# Patient Record
Sex: Female | Born: 1935 | Race: White | Hispanic: No | Marital: Married | Smoking: Never smoker
Health system: Southern US, Community
[De-identification: ages and names within clinical notes are randomized; demographics above are authoritative.]

## PROBLEM LIST (undated history)

## (undated) DIAGNOSIS — R0602 Shortness of breath: Secondary | ICD-10-CM

## (undated) DIAGNOSIS — N390 Urinary tract infection, site not specified: Secondary | ICD-10-CM

## (undated) DIAGNOSIS — F329 Major depressive disorder, single episode, unspecified: Secondary | ICD-10-CM

## (undated) DIAGNOSIS — S42309A Unspecified fracture of shaft of humerus, unspecified arm, initial encounter for closed fracture: Secondary | ICD-10-CM

## (undated) DIAGNOSIS — R278 Other lack of coordination: Secondary | ICD-10-CM

## (undated) DIAGNOSIS — R251 Tremor, unspecified: Secondary | ICD-10-CM

## (undated) DIAGNOSIS — I10 Essential (primary) hypertension: Secondary | ICD-10-CM

## (undated) DIAGNOSIS — G2581 Restless legs syndrome: Secondary | ICD-10-CM

## (undated) DIAGNOSIS — Z8709 Personal history of other diseases of the respiratory system: Secondary | ICD-10-CM

## (undated) DIAGNOSIS — E039 Hypothyroidism, unspecified: Secondary | ICD-10-CM

## (undated) DIAGNOSIS — S92909A Unspecified fracture of unspecified foot, initial encounter for closed fracture: Secondary | ICD-10-CM

## (undated) DIAGNOSIS — M4800 Spinal stenosis, site unspecified: Secondary | ICD-10-CM

## (undated) DIAGNOSIS — D649 Anemia, unspecified: Secondary | ICD-10-CM

## (undated) DIAGNOSIS — M3500 Sicca syndrome, unspecified: Secondary | ICD-10-CM

## (undated) DIAGNOSIS — Z8489 Family history of other specified conditions: Secondary | ICD-10-CM

## (undated) DIAGNOSIS — I2510 Atherosclerotic heart disease of native coronary artery without angina pectoris: Secondary | ICD-10-CM

## (undated) DIAGNOSIS — R2681 Unsteadiness on feet: Secondary | ICD-10-CM

## (undated) DIAGNOSIS — S62509A Fracture of unspecified phalanx of unspecified thumb, initial encounter for closed fracture: Secondary | ICD-10-CM

## (undated) DIAGNOSIS — I4510 Unspecified right bundle-branch block: Secondary | ICD-10-CM

## (undated) DIAGNOSIS — G20 Parkinson's disease: Secondary | ICD-10-CM

## (undated) DIAGNOSIS — I739 Peripheral vascular disease, unspecified: Secondary | ICD-10-CM

## (undated) DIAGNOSIS — E7800 Pure hypercholesterolemia, unspecified: Secondary | ICD-10-CM

## (undated) DIAGNOSIS — R9439 Abnormal result of other cardiovascular function study: Secondary | ICD-10-CM

## (undated) DIAGNOSIS — G20A1 Parkinson's disease without dyskinesia, without mention of fluctuations: Secondary | ICD-10-CM

## (undated) DIAGNOSIS — G8929 Other chronic pain: Secondary | ICD-10-CM

## (undated) DIAGNOSIS — M810 Age-related osteoporosis without current pathological fracture: Secondary | ICD-10-CM

## (undated) DIAGNOSIS — K219 Gastro-esophageal reflux disease without esophagitis: Secondary | ICD-10-CM

## (undated) DIAGNOSIS — K5900 Constipation, unspecified: Secondary | ICD-10-CM

## (undated) DIAGNOSIS — R441 Visual hallucinations: Secondary | ICD-10-CM

## (undated) DIAGNOSIS — A419 Sepsis, unspecified organism: Secondary | ICD-10-CM

## (undated) DIAGNOSIS — R41841 Cognitive communication deficit: Secondary | ICD-10-CM

## (undated) DIAGNOSIS — M797 Fibromyalgia: Secondary | ICD-10-CM

## (undated) DIAGNOSIS — I350 Nonrheumatic aortic (valve) stenosis: Secondary | ICD-10-CM

## (undated) DIAGNOSIS — G629 Polyneuropathy, unspecified: Secondary | ICD-10-CM

## (undated) DIAGNOSIS — F419 Anxiety disorder, unspecified: Secondary | ICD-10-CM

## (undated) DIAGNOSIS — F32A Depression, unspecified: Secondary | ICD-10-CM

## (undated) DIAGNOSIS — Z87442 Personal history of urinary calculi: Secondary | ICD-10-CM

## (undated) DIAGNOSIS — Z8719 Personal history of other diseases of the digestive system: Secondary | ICD-10-CM

## (undated) DIAGNOSIS — I5032 Chronic diastolic (congestive) heart failure: Secondary | ICD-10-CM

## (undated) DIAGNOSIS — M1990 Unspecified osteoarthritis, unspecified site: Secondary | ICD-10-CM

## (undated) DIAGNOSIS — R4189 Other symptoms and signs involving cognitive functions and awareness: Secondary | ICD-10-CM

## (undated) HISTORY — DX: Atherosclerotic heart disease of native coronary artery without angina pectoris: I25.10

## (undated) HISTORY — DX: Fibromyalgia: M79.7

## (undated) HISTORY — DX: Unspecified fracture of shaft of humerus, unspecified arm, initial encounter for closed fracture: S42.309A

## (undated) HISTORY — DX: Restless legs syndrome: G25.81

## (undated) HISTORY — DX: Peripheral vascular disease, unspecified: I73.9

## (undated) HISTORY — DX: Shortness of breath: R06.02

## (undated) HISTORY — DX: Unspecified right bundle-branch block: I45.10

## (undated) HISTORY — DX: Parkinson's disease: G20

## (undated) HISTORY — PX: OTHER SURGICAL HISTORY: SHX169

## (undated) HISTORY — DX: Parkinson's disease without dyskinesia, without mention of fluctuations: G20.A1

## (undated) HISTORY — PX: EYE SURGERY: SHX253

## (undated) HISTORY — DX: Anemia, unspecified: D64.9

## (undated) HISTORY — PX: BACK SURGERY: SHX140

## (undated) HISTORY — DX: Chronic diastolic (congestive) heart failure: I50.32

## (undated) HISTORY — DX: Abnormal result of other cardiovascular function study: R94.39

## (undated) HISTORY — DX: Gastro-esophageal reflux disease without esophagitis: K21.9

## (undated) HISTORY — PX: ABDOMINAL HYSTERECTOMY: SHX81

## (undated) HISTORY — PX: WRIST FRACTURE SURGERY: SHX121

## (undated) HISTORY — DX: Pure hypercholesterolemia, unspecified: E78.00

## (undated) HISTORY — DX: Sjogren syndrome, unspecified: M35.00

## (undated) HISTORY — DX: Nonrheumatic aortic (valve) stenosis: I35.0

## (undated) HISTORY — PX: TOTAL HIP ARTHROPLASTY: SHX124

---
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
## 1998-12-20 ENCOUNTER — Ambulatory Visit (HOSPITAL_COMMUNITY): Admission: RE | Admit: 1998-12-20 | Discharge: 1998-12-20 | Payer: Self-pay | Admitting: *Deleted

## 1998-12-29 ENCOUNTER — Encounter: Payer: Self-pay | Admitting: Family Medicine

## 1998-12-29 ENCOUNTER — Ambulatory Visit (HOSPITAL_COMMUNITY): Admission: RE | Admit: 1998-12-29 | Discharge: 1998-12-29 | Payer: Self-pay | Admitting: Family Medicine

## 1999-01-11 ENCOUNTER — Other Ambulatory Visit: Admission: RE | Admit: 1999-01-11 | Discharge: 1999-01-11 | Payer: Self-pay | Admitting: Obstetrics and Gynecology

## 1999-09-26 ENCOUNTER — Encounter: Admission: RE | Admit: 1999-09-26 | Discharge: 1999-09-26 | Payer: Self-pay | Admitting: Obstetrics and Gynecology

## 1999-09-26 ENCOUNTER — Encounter: Payer: Self-pay | Admitting: Obstetrics and Gynecology

## 1999-12-25 ENCOUNTER — Other Ambulatory Visit: Admission: RE | Admit: 1999-12-25 | Discharge: 1999-12-25 | Payer: Self-pay | Admitting: Gastroenterology

## 1999-12-25 ENCOUNTER — Encounter (INDEPENDENT_AMBULATORY_CARE_PROVIDER_SITE_OTHER): Payer: Self-pay

## 2000-02-14 ENCOUNTER — Other Ambulatory Visit: Admission: RE | Admit: 2000-02-14 | Discharge: 2000-02-14 | Payer: Self-pay | Admitting: Obstetrics and Gynecology

## 2000-02-26 ENCOUNTER — Encounter: Payer: Self-pay | Admitting: Obstetrics and Gynecology

## 2000-02-26 ENCOUNTER — Encounter: Admission: RE | Admit: 2000-02-26 | Discharge: 2000-02-26 | Payer: Self-pay | Admitting: Obstetrics and Gynecology

## 2000-03-26 ENCOUNTER — Ambulatory Visit (HOSPITAL_COMMUNITY): Admission: RE | Admit: 2000-03-26 | Discharge: 2000-03-26 | Payer: Self-pay | Admitting: Gastroenterology

## 2000-03-26 ENCOUNTER — Encounter (INDEPENDENT_AMBULATORY_CARE_PROVIDER_SITE_OTHER): Payer: Self-pay | Admitting: Specialist

## 2000-03-29 ENCOUNTER — Encounter: Admission: RE | Admit: 2000-03-29 | Discharge: 2000-03-29 | Payer: Self-pay | Admitting: Gastroenterology

## 2000-03-29 ENCOUNTER — Encounter: Payer: Self-pay | Admitting: Gastroenterology

## 2000-06-11 ENCOUNTER — Encounter: Payer: Self-pay | Admitting: Gastroenterology

## 2000-06-11 ENCOUNTER — Encounter: Admission: RE | Admit: 2000-06-11 | Discharge: 2000-06-11 | Payer: Self-pay | Admitting: Gastroenterology

## 2001-03-04 ENCOUNTER — Encounter: Payer: Self-pay | Admitting: Obstetrics and Gynecology

## 2001-03-04 ENCOUNTER — Ambulatory Visit (HOSPITAL_COMMUNITY): Admission: RE | Admit: 2001-03-04 | Discharge: 2001-03-04 | Payer: Self-pay | Admitting: Obstetrics and Gynecology

## 2001-04-08 ENCOUNTER — Other Ambulatory Visit: Admission: RE | Admit: 2001-04-08 | Discharge: 2001-04-08 | Payer: Self-pay | Admitting: Obstetrics and Gynecology

## 2001-04-16 ENCOUNTER — Ambulatory Visit (HOSPITAL_COMMUNITY): Admission: RE | Admit: 2001-04-16 | Discharge: 2001-04-16 | Payer: Self-pay | Admitting: Cardiovascular Disease

## 2002-01-08 ENCOUNTER — Ambulatory Visit (HOSPITAL_COMMUNITY): Admission: RE | Admit: 2002-01-08 | Discharge: 2002-01-08 | Payer: Self-pay | Admitting: Gastroenterology

## 2002-01-08 ENCOUNTER — Encounter (INDEPENDENT_AMBULATORY_CARE_PROVIDER_SITE_OTHER): Payer: Self-pay | Admitting: Specialist

## 2002-03-17 ENCOUNTER — Other Ambulatory Visit: Admission: RE | Admit: 2002-03-17 | Discharge: 2002-03-17 | Payer: Self-pay | Admitting: Family Medicine

## 2003-04-21 ENCOUNTER — Other Ambulatory Visit: Admission: RE | Admit: 2003-04-21 | Discharge: 2003-04-21 | Payer: Self-pay | Admitting: Family Medicine

## 2004-12-10 HISTORY — PX: CARDIAC CATHETERIZATION: SHX172

## 2005-01-19 ENCOUNTER — Ambulatory Visit (HOSPITAL_COMMUNITY): Admission: RE | Admit: 2005-01-19 | Discharge: 2005-01-19 | Payer: Self-pay | Admitting: Cardiology

## 2005-02-14 ENCOUNTER — Ambulatory Visit: Payer: Self-pay | Admitting: Pulmonary Disease

## 2005-03-19 ENCOUNTER — Ambulatory Visit: Payer: Self-pay | Admitting: Pulmonary Disease

## 2005-03-19 ENCOUNTER — Ambulatory Visit: Admission: RE | Admit: 2005-03-19 | Discharge: 2005-03-19 | Payer: Self-pay | Admitting: Pulmonary Disease

## 2005-03-26 ENCOUNTER — Ambulatory Visit: Payer: Self-pay | Admitting: Pulmonary Disease

## 2005-04-16 ENCOUNTER — Ambulatory Visit: Payer: Self-pay | Admitting: Pulmonary Disease

## 2005-04-26 ENCOUNTER — Other Ambulatory Visit: Admission: RE | Admit: 2005-04-26 | Discharge: 2005-04-26 | Payer: Self-pay | Admitting: Family Medicine

## 2005-07-04 ENCOUNTER — Ambulatory Visit (HOSPITAL_BASED_OUTPATIENT_CLINIC_OR_DEPARTMENT_OTHER): Admission: RE | Admit: 2005-07-04 | Discharge: 2005-07-04 | Payer: Self-pay | Admitting: Surgery

## 2005-07-04 ENCOUNTER — Encounter (INDEPENDENT_AMBULATORY_CARE_PROVIDER_SITE_OTHER): Payer: Self-pay | Admitting: *Deleted

## 2005-10-04 ENCOUNTER — Ambulatory Visit (HOSPITAL_COMMUNITY): Admission: RE | Admit: 2005-10-04 | Discharge: 2005-10-04 | Payer: Self-pay | Admitting: Gastroenterology

## 2005-12-21 ENCOUNTER — Ambulatory Visit (HOSPITAL_COMMUNITY): Admission: RE | Admit: 2005-12-21 | Discharge: 2005-12-21 | Payer: Self-pay | Admitting: General Surgery

## 2006-01-03 ENCOUNTER — Ambulatory Visit (HOSPITAL_COMMUNITY): Admission: RE | Admit: 2006-01-03 | Discharge: 2006-01-03 | Payer: Self-pay | Admitting: Gastroenterology

## 2006-02-25 ENCOUNTER — Ambulatory Visit: Payer: Self-pay | Admitting: Family Medicine

## 2006-03-28 ENCOUNTER — Ambulatory Visit: Payer: Self-pay | Admitting: Family Medicine

## 2006-05-09 ENCOUNTER — Encounter: Admission: RE | Admit: 2006-05-09 | Discharge: 2006-05-09 | Payer: Self-pay | Admitting: Family Medicine

## 2006-11-06 ENCOUNTER — Encounter: Admission: RE | Admit: 2006-11-06 | Discharge: 2006-11-06 | Payer: Self-pay | Admitting: Neurological Surgery

## 2007-01-02 ENCOUNTER — Inpatient Hospital Stay (HOSPITAL_COMMUNITY): Admission: RE | Admit: 2007-01-02 | Discharge: 2007-01-08 | Payer: Self-pay | Admitting: Neurological Surgery

## 2007-01-22 ENCOUNTER — Inpatient Hospital Stay (HOSPITAL_COMMUNITY): Admission: RE | Admit: 2007-01-22 | Discharge: 2007-01-27 | Payer: Self-pay | Admitting: Neurological Surgery

## 2007-02-17 ENCOUNTER — Encounter: Admission: RE | Admit: 2007-02-17 | Discharge: 2007-02-17 | Payer: Self-pay | Admitting: Neurological Surgery

## 2007-04-14 ENCOUNTER — Encounter: Admission: RE | Admit: 2007-04-14 | Discharge: 2007-04-14 | Payer: Self-pay | Admitting: Neurological Surgery

## 2007-05-30 ENCOUNTER — Encounter: Admission: RE | Admit: 2007-05-30 | Discharge: 2007-05-30 | Payer: Self-pay | Admitting: Family Medicine

## 2007-07-28 ENCOUNTER — Ambulatory Visit (HOSPITAL_COMMUNITY): Admission: RE | Admit: 2007-07-28 | Discharge: 2007-07-28 | Payer: Self-pay | Admitting: Orthopedic Surgery

## 2007-08-15 ENCOUNTER — Ambulatory Visit (HOSPITAL_COMMUNITY): Admission: RE | Admit: 2007-08-15 | Discharge: 2007-08-15 | Payer: Self-pay | Admitting: Family Medicine

## 2007-08-18 ENCOUNTER — Encounter: Admission: RE | Admit: 2007-08-18 | Discharge: 2007-08-18 | Payer: Self-pay | Admitting: Neurological Surgery

## 2007-08-26 ENCOUNTER — Encounter: Admission: RE | Admit: 2007-08-26 | Discharge: 2007-08-26 | Payer: Self-pay | Admitting: Neurological Surgery

## 2007-10-03 ENCOUNTER — Encounter
Admission: RE | Admit: 2007-10-03 | Discharge: 2007-10-03 | Payer: Self-pay | Admitting: Physical Medicine and Rehabilitation

## 2008-04-05 ENCOUNTER — Inpatient Hospital Stay (HOSPITAL_COMMUNITY): Admission: RE | Admit: 2008-04-05 | Discharge: 2008-04-08 | Payer: Self-pay | Admitting: Orthopedic Surgery

## 2008-06-03 ENCOUNTER — Ambulatory Visit (HOSPITAL_COMMUNITY): Admission: RE | Admit: 2008-06-03 | Discharge: 2008-06-03 | Payer: Self-pay | Admitting: Family Medicine

## 2008-09-23 ENCOUNTER — Encounter: Admission: RE | Admit: 2008-09-23 | Discharge: 2008-09-23 | Payer: Self-pay | Admitting: Gastroenterology

## 2008-10-28 ENCOUNTER — Ambulatory Visit (HOSPITAL_COMMUNITY): Admission: RE | Admit: 2008-10-28 | Discharge: 2008-10-28 | Payer: Self-pay | Admitting: Gastroenterology

## 2009-01-12 ENCOUNTER — Encounter (INDEPENDENT_AMBULATORY_CARE_PROVIDER_SITE_OTHER): Payer: Self-pay | Admitting: Family Medicine

## 2009-01-12 ENCOUNTER — Ambulatory Visit: Payer: Self-pay | Admitting: Surgery

## 2009-01-12 ENCOUNTER — Ambulatory Visit (HOSPITAL_COMMUNITY): Admission: RE | Admit: 2009-01-12 | Discharge: 2009-01-12 | Payer: Self-pay | Admitting: Family Medicine

## 2009-02-02 ENCOUNTER — Ambulatory Visit (HOSPITAL_COMMUNITY): Admission: RE | Admit: 2009-02-02 | Discharge: 2009-02-02 | Payer: Self-pay | Admitting: Family Medicine

## 2009-06-21 ENCOUNTER — Ambulatory Visit (HOSPITAL_COMMUNITY): Admission: RE | Admit: 2009-06-21 | Discharge: 2009-06-21 | Payer: Self-pay | Admitting: Family Medicine

## 2009-09-27 ENCOUNTER — Encounter: Admission: RE | Admit: 2009-09-27 | Discharge: 2009-09-27 | Payer: Self-pay | Admitting: Neurological Surgery

## 2009-10-07 ENCOUNTER — Encounter: Admission: RE | Admit: 2009-10-07 | Discharge: 2009-10-07 | Payer: Self-pay | Admitting: Neurological Surgery

## 2009-12-23 ENCOUNTER — Encounter: Admission: RE | Admit: 2009-12-23 | Discharge: 2009-12-23 | Payer: Self-pay | Admitting: Family Medicine

## 2009-12-27 ENCOUNTER — Encounter: Admission: RE | Admit: 2009-12-27 | Discharge: 2009-12-27 | Payer: Self-pay | Admitting: Neurological Surgery

## 2010-02-07 ENCOUNTER — Ambulatory Visit (HOSPITAL_COMMUNITY): Admission: RE | Admit: 2010-02-07 | Discharge: 2010-02-07 | Payer: Self-pay | Admitting: Family Medicine

## 2010-02-14 ENCOUNTER — Encounter: Admission: RE | Admit: 2010-02-14 | Discharge: 2010-02-14 | Payer: Self-pay | Admitting: Neurological Surgery

## 2010-05-24 ENCOUNTER — Inpatient Hospital Stay (HOSPITAL_COMMUNITY): Admission: RE | Admit: 2010-05-24 | Discharge: 2010-05-27 | Payer: Self-pay | Admitting: Neurological Surgery

## 2010-06-09 ENCOUNTER — Ambulatory Visit
Admission: RE | Admit: 2010-06-09 | Discharge: 2010-06-09 | Payer: Self-pay | Source: Home / Self Care | Admitting: Family Medicine

## 2010-06-09 ENCOUNTER — Ambulatory Visit: Payer: Self-pay | Admitting: Vascular Surgery

## 2010-06-26 ENCOUNTER — Encounter: Admission: RE | Admit: 2010-06-26 | Discharge: 2010-06-26 | Payer: Self-pay | Admitting: Neurological Surgery

## 2010-06-28 ENCOUNTER — Ambulatory Visit (HOSPITAL_COMMUNITY): Admission: RE | Admit: 2010-06-28 | Discharge: 2010-06-28 | Payer: Self-pay | Admitting: Family Medicine

## 2010-08-15 ENCOUNTER — Encounter: Admission: RE | Admit: 2010-08-15 | Discharge: 2010-08-15 | Payer: Self-pay | Admitting: Neurological Surgery

## 2010-10-05 ENCOUNTER — Encounter: Admission: RE | Admit: 2010-10-05 | Discharge: 2010-10-05 | Payer: Self-pay | Admitting: Cardiology

## 2010-11-10 ENCOUNTER — Encounter: Admission: RE | Admit: 2010-11-10 | Discharge: 2010-11-10 | Payer: Self-pay | Admitting: Diagnostic Neuroimaging

## 2010-11-21 ENCOUNTER — Encounter
Admission: RE | Admit: 2010-11-21 | Discharge: 2010-11-21 | Payer: Self-pay | Source: Home / Self Care | Attending: Neurological Surgery | Admitting: Neurological Surgery

## 2010-12-30 ENCOUNTER — Encounter: Payer: Self-pay | Admitting: Diagnostic Neuroimaging

## 2010-12-31 ENCOUNTER — Encounter: Payer: Self-pay | Admitting: Orthopedic Surgery

## 2010-12-31 ENCOUNTER — Encounter: Payer: Self-pay | Admitting: General Surgery

## 2011-02-26 LAB — CBC
HCT: 37.7 % (ref 36.0–46.0)
Hemoglobin: 12.7 g/dL (ref 12.0–15.0)
MCHC: 33.8 g/dL (ref 30.0–36.0)
MCV: 95.1 fL (ref 78.0–100.0)
Platelets: 338 10*3/uL (ref 150–400)
RBC: 3.96 MIL/uL (ref 3.87–5.11)
WBC: 8.8 10*3/uL (ref 4.0–10.5)

## 2011-02-26 LAB — URINALYSIS, ROUTINE W REFLEX MICROSCOPIC
Bilirubin Urine: NEGATIVE
Glucose, UA: NEGATIVE mg/dL
Hgb urine dipstick: NEGATIVE
Protein, ur: NEGATIVE mg/dL
Urobilinogen, UA: 1 mg/dL (ref 0.0–1.0)
pH: 6 (ref 5.0–8.0)

## 2011-02-26 LAB — URINE MICROSCOPIC-ADD ON

## 2011-02-26 LAB — COMPREHENSIVE METABOLIC PANEL
Alkaline Phosphatase: 77 U/L (ref 39–117)
Calcium: 9.8 mg/dL (ref 8.4–10.5)
Creatinine, Ser: 0.74 mg/dL (ref 0.4–1.2)
GFR calc non Af Amer: >60 mL/min (ref >60)
Glucose, Bld: 99 mg/dL (ref 70–99)
Potassium: 4.6 mEq/L (ref 3.5–5.1)
Total Bilirubin: 0.2 mg/dL — ABNORMAL LOW (ref 0.3–1.2)

## 2011-02-26 LAB — SURGICAL PCR SCREEN
MRSA, PCR: NEGATIVE
Staphylococcus aureus: NEGATIVE

## 2011-02-26 LAB — DIFFERENTIAL
Basophils Relative: 0 % (ref 0–1)
Monocytes Absolute: 0.7 10*3/uL (ref 0.1–1.0)

## 2011-02-26 LAB — PROTIME-INR: Prothrombin Time: 13.4 seconds (ref 11.6–15.2)

## 2011-03-09 ENCOUNTER — Ambulatory Visit
Admission: RE | Admit: 2011-03-09 | Discharge: 2011-03-09 | Disposition: A | Discharge status: Home / Self Care | Payer: Medicare Other | Source: Ambulatory Visit | Attending: Neurological Surgery | Admitting: Neurological Surgery

## 2011-03-09 ENCOUNTER — Other Ambulatory Visit: Payer: Self-pay | Admitting: Neurological Surgery

## 2011-03-09 DIAGNOSIS — M549 Dorsalgia, unspecified: Secondary | ICD-10-CM

## 2011-03-14 ENCOUNTER — Encounter (HOSPITAL_COMMUNITY): Payer: Medicare Other | Attending: Family Medicine

## 2011-03-14 DIAGNOSIS — M810 Age-related osteoporosis without current pathological fracture: Secondary | ICD-10-CM | POA: Insufficient documentation

## 2011-04-24 NOTE — Discharge Summary (Signed)
NAMEDEBANY, VANTOL             ACCOUNT NO.:  [id number]   MEDICAL RECORD NO.:  [id number]          PATIENT TYPE:  INP   LOCATION:  1618                         FACILITY:  [facility]   PHYSICIAN:  Madlyn Frankel. Charlann Boxer, M.D.  DATE OF BIRTH:  1931/05/13   DATE OF ADMISSION:  04/05/2008  DATE OF DISCHARGE:                               DISCHARGE SUMMARY   ADMITTING DIAGNOSES:  1. Osteoarthritis.  2. Degenerative stenosis of the lumbar spine.  3. Hypothyroidism.  4. Dyslipidemia.  5. Hiatal hernia.  6. Osteoporosis.  7. Restless leg syndrome.   DISCHARGE DIAGNOSES:  1. Osteoarthritis.  2. Degenerative stenosis of the lumbar spine.  3. Hypothyroidism.  4. Dyslipidemia.  5. Hiatal hernia.  6. Osteoporosis.  7. Restless leg syndrome.   CONSULTATION:  None.   PROCEDURE:  Right total hip arthroplasty by surgeon Dr. Durene Romans.  Assistant Dwyane Luo PA-C.   HISTORY OF PRESENT ILLNESS:  A 77 year old female with a history of  right hip and groin pain secondary to osteoarthritis.  Refractory to all  conservative treatments.  Does have a recent history of lumbar  laminectomy with subsequent interbody fusion.  She was presurgically  assessed of her right total hip replacement.   LABS:  Preadmission CBC showed hemoglobin/hematocrit respectively be  12.8, 38.6 with platelets of 357.  She was tracked throughout her course  of stay.  She did have some blood loss but was stable at time of  discharge.  She had 9 hemoglobin, 26.3 hematocrit, platelets were 219.  Preadmission UA negative.  Metabolic panel preadmission all within  normal limits.  Tracked throughout her course of stay.  At time of  discharge:  sodium 141, potassium 4.1, glucose 121, creatinine 0.74,  calcium was 8.6 and a normal kidney function.   RADIOLOGY:  Chest two-view:  no active disease.  Portable pelvis  satisfactory, right hip replacement.   HOSPITAL COURSE:  The patient underwent right total hip replacement,  tolerated  procedure well, was admitted to the orthopedic floor.  Her  stay was unremarkable except for limited progress with her physical  therapy.  Otherwise she remained hemodynamically stable throughout the  course of stay.  She did have a drop in her hemoglobin, was stable and  was not lightheaded when ambulating.  Her dressing was changed on a  daily basis without any significant drainage from her wound.  Her  Hemovac was discontinued intact.  She is neurovascularly intact for her  right lower extremity throughout her course stay.  Dressing was changed  on a daily basis.  She was weightbearing as tolerated.  Made moderate to  limited progress.  She was of moderate amount of assistance just with  transition, ambulated approximately 25 feet prior to discharge with the  use of a rolling walker.  DVT prophylaxis Lovenox was started in the  hospital with plans for continuation for an additional 11 days at time  of discharge with then subsequent switching over to aspirin on a daily  basis.  On the third day she was stable and ready for discharge for  further rehab.   DISCHARGE DISPOSITION:  Discharged to rehab facility stable and improved  condition with need for further progress.   DISCHARGE PHYSICAL THERAPY:  She is weightbearing as tolerated with the  use rolling walker with goals of physical therapy to be to increase  independence in activities of daily living, minimize pain, increase  range of motion, increase strength.  Want to transition from rolling  walker at a single point cane, as soon as she has good strength and  balance.   DISCHARGE WOUND CARE:  Keep wound dry, change dressing on a daily basis.  The patient may shower but cover with plastic, tape edges.  Rechange dry  dressing afterwards.   DISCHARGE DIET:  Regular as tolerated by the patient.   DISCHARGE MEDICATIONS:  1. Lovenox 40 mg subcu every 24 hours times 11 days.  2. Robaxin 500 mg by mouth every six to eight hours as  needed for      muscle spasm pain.  3. Vicodin 5/325one to two by mouth every four to six hours as needed      for pain.  4. Colace 100 mg by mouth twice daily as needed for constipation.  5. MiraLax 17 grams by mouth daily as needed for constipation.  6. Enteric-coated aspirin 325 mg one by mouth daily x4 weeks after      Lovenox completed.   HOME MEDICATIONS:  1. Prevacid 30 mg by mouth twice daily.  2. Pravastatin 40 mg by mouth daily.  3. Metoprolol 50 mg one by mouth daily.  4. Zetia 10 mg one by mouth daily.  5. Evista 60 mg one by mouth daily.  6. Mirapex 25 mg one by mouth daily.  7. Synthroid 50 mcg for three days and then Synthroid 75 mcg for four      days, verify with the patient.  8. Diltiazem 120 mg by mouth daily.  9. Citalopram 20 mg one by mouth daily.  10.Calcium to verify dosage amount for the patient but appears to say      3000 mg one daily.  11.Multivitamin daily.   DISCHARGE FOLLOWUP:  Follow up with Dr. Charlann Boxer at phone number [phone] in  10-14 days for wound check.     ______________________________  Yetta Glassman. [address], [place]      Madlyn Frankel. Charlann Boxer, M.D.  Electronically Signed    BLM/MEDQ  D:  04/08/2008  T:  04/08/2008  Job:  [id number]

## 2011-04-24 NOTE — Op Note (Signed)
NAMEJAVONA, BERGEVIN             ACCOUNT NO.:  [id number]   MEDICAL RECORD NO.:  [id number]          PATIENT TYPE:  INP   LOCATION:  0002                         FACILITY:  [facility]   PHYSICIAN:  Madlyn Frankel. Charlann Boxer, M.D.  DATE OF BIRTH:  Nov 04, 1931   DATE OF PROCEDURE:  04/05/2008  DATE OF DISCHARGE:                               OPERATIVE REPORT   PREOPERATIVE DIAGNOSIS:  Right hip osteoarthritis.   POSTOPERATIVE DIAGNOSIS:  Right hip osteoarthritis.   PROCEDURE:  Right total hip replacement.   COMPONENTS USED:  DePuy hip system:  Size 50 pinnacle cup; a 32, +4, ten-  degree high walled liner; with a 32, +1 ball; and a trial lock 7  standard stem.   SURGEON:  Madlyn Frankel. Charlann Boxer, M.D.   ASSISTANT:  Yetta Glassman. Mann, PA.   ANESTHESIA:  General.   BLOOD LOSS:  500.   DRAINS:  Times one.   COMPLICATIONS:  None.   INDICATIONS FOR THE PROCEDURE:  Ms. Jobst is a pleasant 77 year old  female who presented to the office for evaluation of some right hip  pain.  She had failed to respond to some conservative measures.  Radiographs had revealed some degenerative changes.  She had been seen  and evaluated by Dr. Marikay Alar for potential back workup.  That workup  was negative with persistent anterior thigh pain despite conservative  measures of injections, she wished to proceed with hip replacement  surgery.   She had actually had MRI that had revealed degenerative with labral  tearing and with persistent discomfort again despite conservative  measures, she wished to the hip surgery.  We reviewed the risks and  benefits of this procedure.  The risks of persistent discomfort despite  that workup and the procedure itself.  We discussed the hospital stay,  the risk of infection, DVT, component failure, need for revision  surgery.  Consent was obtained.   PROCEDURE IN DETAIL:  The patient was brought to the operative theater.  Once adequate anesthesia, preoperative antibiotics, Ancef  were  administered, the patient was then positioned in the left lateral  decubitus position with the right hip up.  The right lower extremity was  then prescrubbed, then prepped and draped in sterile fashion.  The  lateral incision was made in the proximal aspect of the hip.  The  iliotibial band and gluteal fascia were incised posteriorly.  The short  external rotators were taken down separate from the posterior capsule.  An L capsulotomy was made preserving the posterior leaflet for  protection of the sciatic nerve, then later anatomic repair at the end  of the case.  The hip was dislocated and the neck osteotomy made based  on the preoperative templating and anatomic landmarks.  The femur was  prepared first with the use of a box osteotome to the lateral portion of  the neck, followed by a starting drill and a hand reamer.  I irrigated  the femur to prevent fat emboli.  I then began broaching the femur with  size 1 broach, setting my anteversion at 20-25 degrees.  I initially  broached up  to a size 5.  I used the calcar planer to finish off this  cut.  I attended now the acetabulum removing the labrum and placing  retractors.  I reamed with a 43 reamer and then reamed up using curved  reamers to a 49 reamer with an excellent bony bed preparation.  There  was a good posterior wall.  Still I wanted to use the 50-mm cup to get  to a 32 head size with polyethylene.   The size 50 pinnacle cup was then impacted with a proximal third 35-40  degrees of abduction and 20 degrees of forward flexion.  I placed two  cancellous screws as support, this despite a good scratch fit.  I  trialed with a 32 neutral, +4 liner.   At this point I attempted my trials.  I initially trialed with 5.  I  felt there was excessive shuck in extension.  However the range of  motion was very good without impingement.  I went ahead and removed this  5 broach and impacted up to a size 7 this time which sat at the level  of  my neck cut.  The size 5 had subsided a bit.   I retrialed with a size 7 standard stem and neck, with a +1 ball.  The  leg lengths appeared to be equal and stable.  There was still about a  millimeter or 2 of shuck.  I did not want to lengthen any more.  At this  point I removed all trials.  I impacted a 50 x 32. Ten-degree, +4 liner.  I placed the 10 degrees up in the superior aspect of the cup.  I did not  need for a stability issue.  I finally impacted the 7 standard stem to  the level where the broach had sat and retrialed, but was happy with a  +1 ball.  The final 32, +1 ball was impacted on a clean and dry trunnion  and the hip reduced.  The hip was irrigated throughout the case and at  this point there was some oozing from the acetabulum and the bone  preparation of femur.  Once the final components were placed, there was  no significant hemostasis required.  I did place a medium Hemovac drain.  I closed the posterior capsule with #1 Ethibond to the superior leaflet.  I then reapproximated the iliotibial band using #1 Ethibond and #1  Vicryl on the gluteal fascia.  The remaining wound was closed in layers  with a 2-0 Vicryl and a running 4-0 Monocryl.  Hip was cleaned, dried  and dressed sterilely with Steri-Strips and a sterile dressing.  She was  brought to recovery room, extubated, tolerated the procedure well.      Madlyn Frankel Charlann Boxer, M.D.  Electronically Signed     MDO/MEDQ  D:  04/05/2008  T:  04/05/2008  Job:  [id number]

## 2011-04-24 NOTE — H&P (Signed)
NAMEALEYSSA, PIKE             ACCOUNT NO.:  [id number]   MEDICAL RECORD NO.:  [id number]          PATIENT TYPE:  INP   LOCATION:                               FACILITY:  [facility]   PHYSICIAN:  Madlyn Frankel. Charlann Boxer, M.D.  DATE OF BIRTH:  [birth year]   DATE OF ADMISSION:  04/05/2008  DATE OF DISCHARGE:                              HISTORY & PHYSICAL   ATTENDING PHYSICIAN:  Madlyn Frankel. Charlann Boxer, M.D.   PROCEDURE:  Will be a right total hip arthroplasty.   CHIEF COMPLAINT:  Right hip and groin pain.   HISTORY OF PRESENT ILLNESS:  A 77 year old female with a history of  right hip and groin pain secondary to osteoarthritis.  It has been  refractory to all conservative treatment including intra-articular  injection.  She does have a recent history of lumbar laminectomy with  subsequent interbody fusion.  She has been presurgically assessed for a  right total hip replacement due to failure of conservative measures  associated with right hip pain.   PAST MEDICAL HISTORY:  Significant for:  1. Osteoarthritis.  2. Degenerative stenosis of her lumbar spine.  3. Hypothyroidism.  4. Dyslipidemia.  5. Hiatal hernia.  6. Osteoporosis.  7. Restless leg syndrome.   PAST SURGICAL HISTORY:  Includes lumbar laminectomy, lumbar fusion,  hysterectomy, breast cyst removed.   FAMILY HISTORY:  Heart disease, arthritis.   SOCIAL HISTORY:  She is married.  Primary care caregiver will be her  husband postoperatively.  She does lives in [place].  She may  require rehab versus just her normal living quarters.   DRUG ALLERGIES:  CODEINE and LACTOSE intolerant.   MEDICATIONS:  1. Prevacid 30 mg p.o. b.i.d.  2. Pravastatin 40 mg p.o. daily.  3. Metoprolol 50 mg one p.o. daily.  4. Zetia 10 mg one p.o. daily.  5. Evista 60 mg one p.o. daily.  6. Mirapex 25 mg one p.o. daily.  7. Synthroid 50 mcg for 3 days and then Synthroid 75 mcg for 4 days,      verify with the patient.  8. Diltiazem 120 mg one  p.o. daily.  9. Citalopram 20 mg one p.o. daily.  10.Colace 100 mg p.o. daily.  11.Aspirin 81 mg p.o. daily.  12.Calcium 3000 mg one daily.  13.Multivitamin daily.  14.MiraLax 17 grams 3 times a week.   REVIEW OF SYSTEMS:  See HPI.   PHYSICAL EXAMINATION:  VITAL SIGNS:  Pulse 72, respirations 18, blood  pressure 120/76.  GENERAL:  Awake, alert and oriented, well-developed, well-nourished, in  no acute distress.  NECK:  Supple.  No carotid bruits.  CHEST:  Lungs clear to auscultation bilaterally.  BREASTS:  Deferred.  HEART:  Regular rate and rhythm.  S1-S2 distinct.  ABDOMEN:  Soft, nontender, nondistended.  Bowel sounds present.  GENITOURINARY:  Deferred.  EXTREMITIES:  Right hip has increased pain with internal range of  motion.  SKIN:  No cellulitis.  NEUROLOGIC:  Intact distal sensibilities.   LABS:  EKG, chest x-ray all pending presurgical testing.   IMPRESSION:  Right hip osteoarthritis.   PLAN OF ACTION:  Right  total hip arthroplasty by surgeon Dr. Durene Romans at [facility] on April 05, 2008.  Risks and complications  were discussed.  No postoperative medications given at time of history  and physical as she may be a SNF rehab patient.     ______________________________  Yetta Glassman. [address], [place]      Madlyn Frankel. Charlann Boxer, M.D.  Electronically Signed    BLM/MEDQ  D:  03/24/2008  T:  03/24/2008  Job:  [id number]   cc:   Tia Alert, MD  Fax: [phone]   Dario Guardian, M.D.  Fax: [phone]

## 2011-04-27 NOTE — Procedures (Signed)
[facility]  Patient:    DONIESHA, LANDAU Visit Number: [id number] MRN: [id number]          Service Type: END Location: ENDO Attending Physician:  Orland Mustard Dictated by:   Llana Aliment. Randa Evens, M.D. Proc. Date: 01/08/02 Admit Date:  01/08/2002   CC:         Dario Guardian, M.D.  Cherlyn Labella, M.D. [facility] [place]   Procedure Report  PROCEDURE PERFORMED:  Colonoscopy with biopsy.  ENDOSCOPIST:  Llana Aliment. Randa Evens, M.D.  MEDICATIONS USED:  Fentanyl 70 mcg, Versed 7 mg IV.  INSTRUMENT:  Pediatric Olympus video colonoscope.  INDICATIONS:  Patient with a history of some sort of abnormal colon pathology causing diarrhea in the past.  This is done to evaluate.  DESCRIPTION OF PROCEDURE:  The procedure had been explained to the patient and consent obtained.  With the patient in the left lateral decubitus position, the Olympus pediatric video colonoscope was inserted and advanced under direct visualization.  The prep was excellent and using abdominal pressure and position changes, we were able to advance around to the cecum.  The ileocecal valve and appendiceal orifice were seen.  The scope was withdrawn.  The mucosa throughout the entire colon appeared endoscopically normal, good vascular pattern, no obvious colitis.  Multiple random biopsies were taken and placed in the same jar.  The left colon was free of diverticular disease.  No polyps or other lesions seen.  The scope was withdrawn, patient tolerated the procedure well.  ASSESSMENT:  Normal colon endoscopically.  No explanation for intermittent diarrhea.  PLAN:  Will see back in the office in six months and check a path for amyloid, microscopic colitis, etc. Dictated by:   Llana Aliment. Randa Evens, M.D. Attending Physician:  Orland Mustard DD:  01/08/02 TD:  01/08/02 Job: [id number]

## 2011-04-27 NOTE — Cardiovascular Report (Signed)
NAME:  [name], [name]             ACCOUNT NO.:  [id number]   MEDICAL RECORD NO.:  [id number]          PATIENT TYPE:  OIB   LOCATION:  2899                         FACILITY:  [facility]   PHYSICIAN:  Armanda Magic, M.D.     DATE OF BIRTH:  September 21, 1931   DATE OF PROCEDURE:  01/19/2005  DATE OF DISCHARGE:                              CARDIAC CATHETERIZATION   HISTORY OF PRESENT ILLNESS:  This is a 77 year old white female with a chief  complaint of shortness of breath, also a family history of coronary disease  and she also has hypertension and hyperlipidemia, who I initially saw for  workup of shortness of breath. Stress Cardiolite study showed no inducible  ischemia and normal left ventricular systolic function but because of  persistent shortness of breath that was limiting her activities, we are now  proceeding with heart catheterization.   PROCEDURE:  Left heart catheterization, coronary angiography, left  ventriculography.   SURGEON:  Armanda Magic, M.D.   INDICATIONS FOR PROCEDURE:  Shortness of breath.   COMPLICATIONS:  None.   INTRAVENOUS ACCESS:  Via right femoral artery, 6 French sheath.   DESCRIPTION OF PROCEDURE:  The patient was brought to the cardiac  catheterization laboratory in a fasting, non-sedated state. Informed consent  was obtained. The patient was connected to continuous heart rate and pulse  oximetry monitoring and intermittent blood pressure monitoring. The right  groin was prepped and draped in the sterile fashion. Xylocaine 1% was used  for local anesthesia. Using the modified Seldinger technique, a 6 French  sheath was placed in the right femoral artery. Under fluoroscopic guidance,  a 6 [place] JL-4 catheter was placed in the left coronary artery. Multiple  cine films were taken in 30 degree RAO and 40 degree LAO views. This  catheter was then exchanged out for a 6 [place] JR-4 catheter, which was  unsuccessful in engaging the right coronary artery. This  catheter was  exchanged out of our guidewire for a 6 [place] __________ right coronary  artery catheter, which successfully engaged the right coronary ostium under  fluoroscopic guidance. Multiple cine films were taken in 30 degree RAO and  40 degree LAO views. This catheter was then exchanged out of our guidewire  for a 6 French angled pigtail catheter, which was placed under fluoroscopic  guidance in the left ventricular cavity. Left ventriculography was performed  in the 30 degree RAO views and a total of 30 cc of contrast at 15 cc per  second. The catheter was then pulled back across the aortic valve with no  significant gradient. At the end of the procedure all catheters and sheaths  were removed. Manual compression was performed until adequate hemostasis was  obtained. The patient was transferred back to her room in stable condition.   RESULTS:  Left main coronary artery is widely patent and bifurcates into the  left anterior descending artery and left circumflex artery. The left  anterior descending artery is widely patent throughout its course at the  apex, although it is a small vessel. It gives rise to two large diagonal  branches, both of which  are widely patient. The left circumflex is widely  patent throughout its course and traverses the AV groove, giving rise to one  very large obtuse marginal branch, which bifurcates into two daughter  branches, both of which are widely patient. The right coronary artery is  widely patent throughout its course and distally bifurcates into the  posterior descending artery and posterolateral artery, both of which are  widely patient.   Left ventriculography shows normal left ventricular systolic function with  ejection fraction of 70%. Left ventricular pressure 140/3 mmHg. Aortic  pressure 137/70 mmHg. Left ventricular EDP 13 to 14 mmHg.   ASSESSMENT:  1.  Shortness of breath, non-cardiac.  2.  Normal coronary arteries.  3.  Normal left  ventricular function.   PLAN:  Discharged to home after intravenous fluid and bedrest. She will  followup with my nurse practitioner in our office for 2 weeks for a groin  check and we will get an outpatient pulmonary evaluation.      TT/MEDQ  D:  01/19/2005  T:  01/19/2005  Job:  [id number]   cc:   Dario Guardian, M.D.  [address]  [place]  [place] [address]  Fax: [phone]

## 2011-04-27 NOTE — Op Note (Signed)
NAMESADIA, [name]             ACCOUNT NO.:  [id number]   MEDICAL RECORD NO.:  [id number]          PATIENT TYPE:  AMB   LOCATION:  ENDO                         FACILITY:  [facility]   PHYSICIAN:  James L. Malon Kindle., M.D.DATE OF BIRTH:  [birth year]   DATE OF PROCEDURE:  01/03/2006  DATE OF DISCHARGE:  01/03/2006                                 OPERATIVE REPORT   PROCEDURE:  24 hour pH study.   INDICATIONS:  The patient had severe problems with esophageal reflux and is  being considered for an antireflux operation. She had an esophageal  manometry showing a poor peristaltic amplitude throughout the procedure  __________ but with normal peristalsis. Mean amplitude was only 17 mm. The  pressure in the lower esophageal sphincter was low but it did relax. It was  difficult to determine due to the low pressure. The patient has seen Dr.  Abbey Chatters and is being considered for antireflux operation with some  procedures done to evaluate.   DESCRIPTION OF PROCEDURE:  The dual probe system was used. The patient had a  total of 27 hours and 14 minutes of data. The results were as follows: 1.  Distal channel % timed pH less than 4 at 29.8% in the upright position,  12.6% recumbent, both markedly elevated. 2. Total time pH less than 4, 243  minutes upright, 67 minutes recumbent for a total of 310 minutes. 3. Total  number of reflux episodes 157 upright and 66 recumbent with the longest  episode being 50 minutes. 4. Composite score: In the distal channel, 107.9  with normal being less than 22. Summary of symptom analysis: The patient had  chest pain, regurgitation, belching and coughing during the procedure. The  coughing was fairly minimal. There was 100% correlation with her heart burn  and belching and chest pain with reflux.   ASSESSMENT:  Significant esophageal reflux.   PLAN:  Will have the patient follow up with Dr. Abbey Chatters.           ______________________________  Llana Aliment Malon Kindle., M.D.     Waldron Session  D:  01/18/2006  T:  01/18/2006  Job:  [id number]   cc:   Dario Guardian, M.D.  Fax: [phone]   Adolph Pollack, M.D.  [address], [address]  [place]  [place] [address]   Llana Aliment. Malon Kindle., M.D.  Fax: [phone]

## 2011-04-27 NOTE — Op Note (Signed)
NAMELINDE, WILENSKY             ACCOUNT NO.:  [id number]   MEDICAL RECORD NO.:  [id number]          PATIENT TYPE:  INP   LOCATION:  3172                         FACILITY:  [facility]   PHYSICIAN:  Tia Alert, MD     DATE OF BIRTH:  Dec 08, 1931   DATE OF PROCEDURE:  01/22/2007  DATE OF DISCHARGE:                               OPERATIVE REPORT   PREOPERATIVE DIAGNOSIS:  Residual foraminal stenosis at L2-3 on the  right with scoliosis L2-3 and right L2 radiculopathy status post  decompressive hemilaminectomy L2-3 on the right.   POSTOPERATIVE DIAGNOSIS:  Residual foraminal stenosis at L2-3 on the  right with scoliosis L2-3 and right L2 radiculopathy status post  decompressive hemilaminectomy L2-3 on the right.   PROCEDURE:  1. Lumbar re-exploration with repeat decompression L2-3 including      hemifacetectomy and bilateral foraminotomies for decompression of      L2 and L3 nerve roots requiring more work than is usually required      for the typical PLIF procedure.  2. Post lumbar interbody fusion, L2-3 utilizing an 8 mm PEEK interbody      cage packed with local autograft and Actifuse and an 8 x 22 mm      tangent interbody bone wedge.  3. Intertransverse arthrodesis L2-3 utilizing local autograft and      Actifuse.  4. Nonsegmental fixation L2-3 utilizing the Legacy pedicle screw      system.   SURGEON:  Dr. Marikay Alar.   ASSISTANT:  Donalee Citrin, M.D.   ANESTHESIA:  General tracheal.   COMPLICATIONS:  None apparent.   INDICATIONS FOR PROCEDURE:  Ms. Rivers is a 77 year old female who  presented with bilateral leg pain consistent with neurogenic  claudication.  She had minimal back pain.  She had right groin pain with  anterior thigh pain.  She had left anterior thigh pain and left buttocks  pain especially with ambulation. She had an MRI and CT myelogram which  showed a lateral recess stenosis at L1-2 on the right, L2-3 on the right  with foraminal stenosis secondary to  scoliosis concave to the right side  along with severe degenerative disease at L2-3 with significant modic  endplate changes and collapse of the disk space narrowing the foramen  quite significantly at L2-S3 on the right side.  At L3-4 on the left  side, she had disk herniation L4-5.  She had moderate central canal  stenosis.  We talked about treatment options.  We did not want to  performed a large fusion procedure on her which was required what we  felt would be a 4-level  lumbar fusion.  We felt this was too big a  surgery, especially given the fact she had no preoperative back pain.  We wanted to try a more minimal decompressive procedure, knowing full  well that we would not be able to decompress the entire foramen at L2-3  on the right side.  Therefore she underwent a right L1-2 and L2-3  hemilaminectomy and a left L3-4, and L4-5 hemilaminectomy with  sublaminar decompressions at L2-3 and at L4-5.  Following  surgery,  she  had residual lateral thigh pain.  She remained in the hospital for about  7 days trying to treat this pain.  She had a repeat MRI which showed  only residual stenosis of the foramen at L2-3 on the right though her  pain did not truly match the L2 radicular distribution.  Therefore we  discharged her from the hospital and set her up for selective L2 nerve  root block.  This was done and she had resolution of her pain for about  two hours.  This suggested that she indeed had residual L2 nerve root  pain from residual stenosis at L2-3.  Therefore we felt like it was  reasonable to go back in and to perform a limited fusion at L2-3.  We  knew that if we proceeded with PLIF at that level, this would  reestablish disk space height and open her foramen.  We would be able to  perform a hemifacetectomy and a wide foraminotomy to decompress both the  L7-L3 nerve roots and achieve a decompression at L2 on the right that  she needed to relieve her pain syndrome.  We went over  this in detail  with the patient she understood.  She wished to proceed.  She understood  the risks, benefits, expected outcome and wished to proceed with repeat  surgery just three weeks after her first surgery.   DESCRIPTION OF PROCEDURE:  The patient was taken to operating room and  after induction of adequate generalized endotracheal anesthesia, she was  rolled into the prone position on the Wilson frame.  All pressure points  were padded.  Her lumbar region was prepped with DuraPrep and draped in  usual sterile fashion.  10 mL local anesthesia was injected and her old  incision was ellipsed out and removed.  We carried the dissection down  to the spinous processes and opened the fascia and took down the  paraspinous musculature in a subperiosteal fashion bilaterally to expose  L2-3 bilaterally.  I carried the dissection out over the facettes to  expose the transverse processes of L2 and L3.  Intraoperative  fluoroscopy confirmed my level.  I then removed the spinous process of  L2 and then removed the hemifacettes of L2, identified the L1 and the L2  nerve roots and marched out the foramen to decompress these, and  performed almost a complete facetectomy at L2-3 on the right to follow  the L2 nerve root distally into the foramen.  Once our decompression of  the L2 and L3 nerve roots was completed, we turned our attention to the  PLIF.  We incised the disk space bilaterally and used sequential  distraction to distract the disk space up 8 mm.  We started on the  patient's left side, used the rotating cutter, Epstein curettes and the  8 mm cutting chisel to prepare the endplates for arthrodesis and used 8  x 22 mm tangent interbody bone wedge and tapped this into position at L2-  3 on the left side.  We then prepared the endplates on the right side  the same way utilizing the rotating cutter and the cutting chisel.  We then packed the midline after preparing the endplates with a local   autograft and Actifuse and then used an 8 mm PEEK interbody cage packed  with local autograft and Actifuse and tapped this into position at L2-3  on the right side.  Once our PLIF was completed, we noticed under  fluoroscopy  that we had nice distraction with opening of the foramen  bilaterally and this actually straightened her scoliosis to the point  that she had a straightened lumbar spine from the AP view.  We then  localized pedicle screw entry zones.  She had small pedicles at L2.  Therefore we probed each pedicle with a pedicle probe, tapped each  pedicle with a 4.5 tap and used 5.5 x 40 mm pedicle screws in the  pedicles at L2 bilaterally.  At L3, we probed the pedicle, tapped each  pedicle with a  5.5 tap, palpated each pedicle in order to assure no  break in the cortex and placed 6.5 x 45 mm pedicle screws into the  pedicles of L3 bilaterally.  We then checked this with AP and lateral  fluoroscopy to assure they were in good position.  We then decorticated  the transverse processes of L2-L3 and placed a mixture of local  autograft and Actifuse out over these to perform intertransverse  arthrodesis.  We then introduced lordotic rods and placed these into the  multiaxial screw heads of the pedicle screws and locked these into  position with a locking caps and the antitorque device after achieving  compression on our grafts.  We then irrigated with saline solution  containing bacitracin, placed a cross link for added stability, placed a  medium Hemovac drain through a separate stab incision palpated along our  nerve root paths again to assure adequate decompression of nerve roots,  lined the dura with Gelfoam and then closed the muscle and fascia with  #1 Vicryl, closed subcutaneous and subcuticular tissue with 2-0 and 3-0  Vicryl and closed the skin with Benzoin and Steri-Strips.  The drapes  were removed.  Sterile dressing was applied.  The patient was awakened  from anesthesia and  transferred to recovery room in stable condition.  At the end of the procedure, all sponge, needle and sponge counts were  correct.      Tia Alert, MD  Electronically Signed     DSJ/MEDQ  D:  01/22/2007  T:  01/22/2007  Job:  [id number]

## 2011-04-27 NOTE — Op Note (Signed)
NAMEWHITNI, PASQUINI             ACCOUNT NO.:  [id number]   MEDICAL RECORD NO.:  [id number]          PATIENT TYPE:  AMB   LOCATION:  DSC                          FACILITY:  [facility]   PHYSICIAN:  Currie Paris, M.D.DATE OF BIRTH:  Jun 25, 1931   DATE OF PROCEDURE:  07/04/2005  DATE OF DISCHARGE:                                 OPERATIVE REPORT   PREOPERATIVE DIAGNOSIS:  Subcutaneous mass, left breast.   POSTOPERATIVE DIAGNOSIS:  Subcutaneous mass, left breast.   OPERATION:  Excision.   SURGEON:  Currie Paris, M.D.   ANESTHESIA:  Local.   CLINICAL HISTORY:  Ms. Gamero has a small subcutaneous mass which is  consistent with a little perhaps epidermoid cyst.  It was adjacent to a scar  from a prior excision of a breast mass.  She desired to have this removed.   DESCRIPTION OF PROCEDURE:  The patient was seen in the minor procedure room.  the mass was identified by the patient and confirmed by me and marked.  The  area was then anesthetized with 1% Xylocaine with epinephrine.  I waited 10  minutes and then prepped the area with Betadine.  I made a short incision  directly over the mass and excised it sharply.  It appeared to be a benign  cyst.  The incision was closed with 4-0 Monocryl subcuticular and Dermabond.  The patient tolerated procedure well.       CJS/MEDQ  D:  07/04/2005  T:  07/04/2005  Job:  [id number]

## 2011-04-27 NOTE — Discharge Summary (Signed)
[name], [name]             ACCOUNT NO.:  [id number]   MEDICAL RECORD NO.:  [id number]          PATIENT TYPE:  INP   LOCATION:  3033                         FACILITY:  [facility]   PHYSICIAN:  Tia Alert, MD     DATE OF BIRTH:  13-Apr-1931   DATE OF ADMISSION:  01/02/2007  DATE OF DISCHARGE:  01/08/2007                               DISCHARGE SUMMARY   ADMITTING DIAGNOSES:  1. Lumbar spondylosis with lumbar spinal stenosis.  2. Back and bilateral leg pain.   PROCEDURE:  Decompressive laminectomy L1-L4.   BRIEF HISTORY OF PRESENT ILLNESS:  [name] is a 77 year old female  who presented with bilateral leg pain, she had anterior quadriceps pain,  right groin pain and bilateral piriformis region pain with neurogenic  claudication.  She had an MRI and a CT myelogram which showed spinal  stenosis at L1-2, L2-3, L3-4 and L4-5.  I recommend a decompressive  laminectomy at each of these levels, she understood the risks, benefits  and expected outcome and wish to proceed.   HOSPITAL COURSE:  The patient was admitted on January 02, 2007 and taken  to operating room where she underwent decompressive laminectomy at L1-  L4.  The patient tolerated the procedure well, was taken to the recovery  room and then to the floor in stable condition.  For details of the  operative procedure, please see the dictated operative note.  The  patient's hospital course was prolonged by her continued right anterior  quadriceps pain.  Her incisions remain clean, dry and intact and she is  afebrile with stable vital signs, however she was slow to mobilize  because of her pain.  We did an MRI and while in the hospital it showed  continued foraminal stenosis at L2-3.  We knew this was likely going to  be the case, as the only way to really decompress this foramen and  remove the bone to do so was to undergo a larger fusion procedure with  interbody grafts to restore disc space height and reestablish  foraminal  cross-sectional area.  We understood this preoperatively and was hoping  we could avoid the fusion procedure.  She continued along this hospital  course, we decided to try an L2 nerve root block on her as an outpatient  and; therefore, she was discharged to home on January 08, 2007 with a  right L2 nerve root block planned for about a week after discharge and  if that were to offer only temporary relief, it would at least give [place]  the diagnosis of an L2 radiculopathy and then we would think that she  would be a candidate to return to the operating room to perform a  posterior lumbar interbody fusion at L2-3 to, again, restore disc space  height, correct her scoliotic deformity and open the foramen.  She was  discharged home in stable condition on January 08, 2007.   FINAL DIAGNOSIS:  Lumbar laminectomy L1-L4.      Tia Alert, MD  Electronically Signed     DSJ/MEDQ  D:  02/14/2007  T:  02/14/2007  Job:  [id number] 

## 2011-04-27 NOTE — Discharge Summary (Signed)
[name], [name]             ACCOUNT NO.:  [id number]   MEDICAL RECORD NO.:  [id number]          PATIENT TYPE:  INP   LOCATION:  3006                         FACILITY:  [facility]   PHYSICIAN:  Tia Alert, MD     DATE OF BIRTH:  1931-09-24   DATE OF ADMISSION:  01/22/2007  DATE OF DISCHARGE:  01/27/2007                               DISCHARGE SUMMARY   ADMITTING DIAGNOSIS:  Failed back syndrome with residual stenosis and  scoliosis L2-3.   PROCEDURE:  Post lumbar interbody fusion with nonsegmental  instrumentation L2-3.   HISTORY OF PRESENT ILLNESS:  [name] is a 77 year old female who  underwent a multilevel decompressive hemilaminectomy with sublaminar  decompression for spinal stenosis just a few weeks ago.  She had  residual foraminal stenosis at L2-3 on the right from scoliosis with  compression of the right L2 nerve root and had continued pain secondary  to that.  She had an L2 nerve root block which helped for a very short  time postoperatively but her pain returned.  I recommended a lumbar  reexploration with repeat decompression at L2-3 followed by posterior  lumbar interbody fusion at L2-3.  She understood the risks, benefits and  expected outcome and wished to proceed.   HOSPITAL COURSE:  The patient was admitted on 01/22/2007 and taken to  the operating room where she underwent a PLIF at L2-3.  The patient  tolerated the procedure well.  Was taken to the recovery room and then  to the floor in stable condition.  For details of the operative  procedure, please see the dictated operative note.  The patient's  hospital course was routine.  There were no complications.  She was  somewhat slow to mobilize but, otherwise, had a routine postoperative  course.  She was out of bed ambulating on postop day one, very short  distances.  Physical and Occupational Therapy worked with the patient.  Her wound remained clean, dry, and intact.  She was advanced to a  regular  diet which she tolerated well.  She still had some low back pain  and some constipation which was treated with enemas.  Her hemoglobin was  8.3 and she was placed on iron for blood loss anemia.  She was  discharged to home in stable condition on 01/27/2007 with plans to  follow up with Dr. Yetta Barre in 2 weeks.   FINAL DIAGNOSIS:  Posterior lumbar interbody fusion with nonsegmental  instrumentation at L2-3.      Tia Alert, MD  Electronically Signed     DSJ/MEDQ  D:  03/07/2007  T:  03/07/2007  Job:  [id number]

## 2011-04-27 NOTE — Op Note (Signed)
NAMEJULYANNA, [name]             ACCOUNT NO.:  [id number]   MEDICAL RECORD NO.:  [id number]          PATIENT TYPE:  OUT   LOCATION:  CARD                         FACILITY:  [facility]   PHYSICIAN:  Oley Balm. Sung Amabile, M.D. [facility] OF BIRTH:  December 18, 1930   DATE OF PROCEDURE:  03/19/2005  DATE OF DISCHARGE:  03/19/2005                                 OPERATIVE REPORT   PROCEDURE:  Cardiopulmonary stress test.   INDICATIONS:  Exertional dyspnea.   DESCRIPTION OF PROCEDURE:  Cardiopulmonary stress testing was performed on a  graded treadmill.  Testing was stopped due to leg discomfort (knees).  Effort was submaximal.  At peak exercise, oxygen uptake was 1.1 L per minute  or 73% of predicted maximum, indicating mild impairment by oxygen uptake.  However, she only exercised to 2.7  metabolic equivalents which would be  consistent with severe impairment.  This discrepancy between workload and  oxygen uptake suggests severe work inefficiency.   At peak exercise, heart rate was 131 beats per minute or 85% of predicted  maximum indicating that cardiovascular limitation was approached or reached.  Oxygen pulse was normal suggesting normal left ventricular function.  Blood  pressure response was normal.  EKG tracings revealed no abnormalities at  rest.  However, there was a rapid increase in heart rate early on with  minimal exertion.  Otherwise, no specific EKG abnormalities were noted.   At peak exercise, minute ventilation was 39.5 L per minute or 53% of  predicted maximum indicating ventilatory reserve remained.  Gas exchange  parameters revealed no abnormalities.  Baseline spirometry could not be  performed due to excessive gagging.  Post exercise spirometry also was not  performed.   SUMMARY:  Very limited study due to its brief duration.  She has mild  impairment by oxygen uptake but severe impairment by workload (45 watts, 2.7  metabolic equivalent).  This discrepancy is consistent with  severe work  inefficiency.  She also approaches and reaches a cardiovascular limitation  very early during exercise.  Because spirometry could not be performed, it  was presumed that she has normal lung function for the purposes of this  interpretation.  If that presumption is accurate, this pattern is most  consistent with obesity and deconditioning.  Musculoskeletal discomfort  further limits her exercise tolerance.      DBS/MEDQ  D:  04/05/2005  T:  04/05/2005  Job:  [id number]   cc:   Danice Goltz, M.D. [facility]

## 2011-04-27 NOTE — Op Note (Signed)
[name], [name]             ACCOUNT NO.:  [id number]   MEDICAL RECORD NO.:  [id number]          PATIENT TYPE:  AMB   LOCATION:  ENDO                         FACILITY:  [facility]   PHYSICIAN:  James L. Malon Kindle., M.D.DATE OF BIRTH:  [birth year]   DATE OF PROCEDURE:  10/04/2005  DATE OF DISCHARGE:  10/04/2005                                 OPERATIVE REPORT   OPERATION/PROCEDURE:  Esophageal manometry.   INDICATIONS:  Persistent dyspepsia and heartburn refractory to medical  therapy.  This procedure is done in anticipation of a possible antireflux  procedure.   RESULTS:  1.  Upper esophageal sphincter is normal with good relaxation with      swallowing.  2.  Esophageal body, very poor peristaltic amplitude throughout all the wet      dry swallows presented.  Most of these swallows show what appears to be      normal peristalsis but there were some swallows that were      nonperistaltic.  The mean amplitude was only 17 mm.  3.  Lower esophageal sphincter, mean pressure 18. Appeared to relax with      swallowing, residual pressure less than 18.  It was noted by the staff      that due to a large hiatal hernia, the LES was difficult to determine.   ASSESSMENT:  Poor peristalsis but otherwise normal lower esophageal  sphincter and upper esophageal sphincter.   PLAN:  Will arrange an office visit to discuss pros and cons of antireflux  procedure.           ______________________________  Llana Aliment. Malon Kindle., M.D.     Waldron Session  D:  10/20/2005  T:  10/22/2005  Job:  [id number]   cc:   Dario Guardian, M.D.  Fax: [phone]   Currie Paris, M.D.  [address]  [place]  [place] [address]

## 2011-04-27 NOTE — Procedures (Signed)
[place]  Patient:    [name], [name]                      MRN: [id number] Proc. Date: 03/26/00 Attending:  Fayrene Fearing L. Randa Evens, M.D. CC:         Dario Guardian, M.D.             Loraine Grip, M.D. [facility]                           Procedure Report  PROCEDURE:  Esophagogastroduodenoscopy with biopsy.  MEDICATIONS:  Hurricaine spray.  Fentanyl 30 mcg and Versed 8 mg IV.  INDICATIONS FOR PROCEDURE:  Diarrhea of unclear etiology.  This is done to obtain a small bowel biopsy.  DESCRIPTION OF PROCEDURE:  The procedure was explained to the patient and consent obtained.  With the patient in the left lateral decubitus position, the Olympus  adult video endoscope was inserted blindly into the esophagus and advanced under direct visualization.  The pyloric channel was identified and passed.  We went own to the full extent of the scope and I took several biopsies.  The mucosa appeared completely normal.  The scope was withdrawn back into the stomach.  The pyloric  channel was normal.  The duodenum, including the bulb and second portion, were endoscopically normal.  The antrum appeared normal.  With the scope in the retroflexed view, there were several polyps up in the fundus of the stomach. Several of these were biopsied and placed in jar #2.  The scope was withdrawn. The distal and proximal esophagus were normal.  The patient tolerated the procedure  well and was maintained on low flow oxygen and pulse oximeter throughout the procedure with no obvious problems.  ASSESSMENT:  Gastric polyps, biopsied.  Otherwise normal endoscopy.  PLAN:  Will check pathology and make further recommendations depending on the results. DD:  03/26/00 TD:  03/26/00 Job: [id number]

## 2011-04-27 NOTE — Op Note (Signed)
[name], [name]             ACCOUNT NO.:  [id number]   MEDICAL RECORD NO.:  [id number]          PATIENT TYPE:  INP   LOCATION:  2899                         FACILITY:  [facility]   PHYSICIAN:  Tia Alert, MD     DATE OF BIRTH:  Jul 16, 1931   DATE OF PROCEDURE:  01/02/2007  DATE OF DISCHARGE:                               OPERATIVE REPORT   PREOPERATIVE DIAGNOSIS:  Lumbar spinal stenosis, L1-L4 with back and  bilateral leg pain.   POSTOPERATIVE DIAGNOSIS:  Lumbar spinal stenosis, L1-L4 with back and  bilateral leg pain.   PROCEDURE:  1. Decompressive lumbar hemilaminectomy, medial facetectomy,      foraminotomy L1-2 on the right.  2. Lumbar hemilaminectomy, medial facetectomy, foraminotomy L2-3 on      the right followed by sublaminar decompression for central canal      and left lateral recess decompression, followed by microdiskectomy      L2-3 on the right utilizing microscopic dissection.  3. Lumbar hemilaminectomy, medial facetectomy with foraminotomy L3-4      on the left.  4. Lumbar hemilaminectomy, medial facetectomy and foraminotomy L4-5 on      the left with sublaminar decompression for decompression of central      canal and right lateral recess at L4-5.   SURGEON:  Dr. Marikay Alar.   ASSISTANT:  Dr. Aliene Beams.   ANESTHESIA:  General endotracheal.   COMPLICATIONS:  None apparent.   INDICATIONS FOR PROCEDURE:  [name] is a 77 year old female who  presented with bilateral leg pain.  She had anterior quadriceps pain,  right groin pain and bilateral piriformis region pain with neurogenic  claudication.  She had an MRI and CT myelogram which showed spinal  stenosis on the right at L1-2 and L2-3 and on the left at L3-4 and  bilaterally at L4-5.  I recommended lumbar decompression at L1-2 and L2-  3 on the right, at L3-4 and L4-5 on the left.  She understood the risks,  benefits, expected outcome wished to proceed.  We originally planned the  L5-S1, but on  further look at the films and consideration of her  symptoms, we felt L5-S1 was asymptomatic level for her and the changes  were quite mild as compared to the changes at the other levels.   DESCRIPTION OF PROCEDURE:  The patient was taken to operating room and  after induction of adequate generalized endotracheal anesthesia she was  rolled in the prone position on the Wilson frame.  All pressure points  were padded.  A lumbar region was prepped with DuraPrep and draped in  usual sterile fashion.  10 mL local anesthesia was injected and a dorsal  midline incision was made and carried down to lumbosacral fascia.  The  fascia was opened on the patient's right side at L1-2, L2-3.  Intraoperative x-ray confirmed my level and then I used a combination of  Kerrison punches and high-speed drill to perform a hemilaminectomy,  medial facetectomy, foraminotomies at L1-2 and L2-3 on the right side.  The L2 and L3 nerve roots were identified and decompressed.  She had  quite  a bit a lateral recess stenosis which was removed at both levels.  I drilled up under the spinous process of L2-3 and used a Kerrison punch  to perform a sublaminar decompression by reaching across the midline and  decompressing to the patient's left lateral recess at L2-3.  I was able  to identify the pedicle of L2 and L3.  The L3 pedicles were identified  bilaterally and I could identify the L3 nerve root on the patient's left  side and make sure it was decompressed.  I then found a significant  subannular disk herniation at L2-3 on the left.  This was incised and  removed utilizing microscopic dissection.  We then turned our attention  to the patient's left side at L3-4 and L4-5 and again performed  hemilaminectomies, medial facetectomies and foraminotomies at L3-4 and  L4-5 on the left side utilizing the Kerrison punch and the high-speed  drill.  Again I drilled up under the spinous process of L4 in order to  decompress the  sublaminar region and right lateral recess at L4-5, again  bilateral pedicles at L5 were identified and decompressed and then the  nerve roots were decompressed at L5 bilaterally.  The L4 nerve root was  also identified a decompressed.  I did not find a significant disk  herniation at L3-4 on the left side, just a subannular bulge as shown on  the CT myelogram.  I then palpated with a coronary dilator along the  nerve roots and into the midline to assure adequate decompression at all  four levels.  I felt like I had good decompression at all four levels.  We irrigated with saline solution containing bacitracin, dried all  bleeding points with bipolar cautery and with Gelfoam, placed two medium  Hemovac drains through separate stab incisions and closed the fascia  with #1 Vicryl, closed subcutaneous and subcuticular tissues with 2-0  and 3-0 Vicryl and closed the skin with Benzoin and Steri-Strips.  The  drapes were removed.  Sterile dressing was applied.  The patient was  awakened from general anesthesia and transported to recovery room in  stable condition.  At the end of procedure all sponge, needle and  instrument counts were correct.      Tia Alert, MD  Electronically Signed     DSJ/MEDQ  D:  01/02/2007  T:  01/02/2007  Job:  [id number]

## 2011-06-04 ENCOUNTER — Other Ambulatory Visit (HOSPITAL_COMMUNITY): Payer: Self-pay | Admitting: Family Medicine

## 2011-06-04 DIAGNOSIS — Z1231 Encounter for screening mammogram for malignant neoplasm of breast: Secondary | ICD-10-CM

## 2011-06-05 ENCOUNTER — Ambulatory Visit
Admission: RE | Admit: 2011-06-05 | Discharge: 2011-06-05 | Disposition: A | Discharge status: Home / Self Care | Payer: Medicare Other | Source: Ambulatory Visit | Attending: Neurological Surgery | Admitting: Neurological Surgery

## 2011-06-05 ENCOUNTER — Other Ambulatory Visit: Payer: Self-pay | Admitting: Neurological Surgery

## 2011-06-05 DIAGNOSIS — M5137 Other intervertebral disc degeneration, lumbosacral region: Secondary | ICD-10-CM

## 2011-06-06 ENCOUNTER — Ambulatory Visit: Payer: Medicare Other | Attending: Diagnostic Neuroimaging | Admitting: Physical Therapy

## 2011-06-06 DIAGNOSIS — R269 Unspecified abnormalities of gait and mobility: Secondary | ICD-10-CM | POA: Insufficient documentation

## 2011-06-06 DIAGNOSIS — G20 Parkinson's disease: Secondary | ICD-10-CM | POA: Insufficient documentation

## 2011-06-06 DIAGNOSIS — IMO0001 Reserved for inherently not codable concepts without codable children: Secondary | ICD-10-CM | POA: Insufficient documentation

## 2011-06-06 DIAGNOSIS — G20A1 Parkinson's disease without dyskinesia, without mention of fluctuations: Secondary | ICD-10-CM | POA: Insufficient documentation

## 2011-06-20 ENCOUNTER — Ambulatory Visit: Payer: Medicare Other | Attending: Diagnostic Neuroimaging | Admitting: Physical Therapy

## 2011-06-20 DIAGNOSIS — R269 Unspecified abnormalities of gait and mobility: Secondary | ICD-10-CM | POA: Insufficient documentation

## 2011-06-20 DIAGNOSIS — R5381 Other malaise: Secondary | ICD-10-CM | POA: Insufficient documentation

## 2011-06-20 DIAGNOSIS — Z5189 Encounter for other specified aftercare: Secondary | ICD-10-CM | POA: Insufficient documentation

## 2011-06-20 DIAGNOSIS — M629 Disorder of muscle, unspecified: Secondary | ICD-10-CM | POA: Insufficient documentation

## 2011-06-20 DIAGNOSIS — M2420 Disorder of ligament, unspecified site: Secondary | ICD-10-CM | POA: Insufficient documentation

## 2011-06-20 DIAGNOSIS — G20 Parkinson's disease: Secondary | ICD-10-CM | POA: Insufficient documentation

## 2011-06-20 DIAGNOSIS — G20A1 Parkinson's disease without dyskinesia, without mention of fluctuations: Secondary | ICD-10-CM | POA: Insufficient documentation

## 2011-06-20 DIAGNOSIS — R279 Unspecified lack of coordination: Secondary | ICD-10-CM | POA: Insufficient documentation

## 2011-06-22 ENCOUNTER — Ambulatory Visit: Payer: Medicare Other | Admitting: Physical Therapy

## 2011-06-27 ENCOUNTER — Ambulatory Visit: Payer: Medicare Other | Admitting: Physical Therapy

## 2011-06-29 ENCOUNTER — Ambulatory Visit: Payer: Medicare Other | Admitting: Physical Therapy

## 2011-07-04 ENCOUNTER — Ambulatory Visit: Payer: Medicare Other | Admitting: Physical Therapy

## 2011-07-04 ENCOUNTER — Ambulatory Visit: Payer: Medicare Other | Admitting: Occupational Therapy

## 2011-07-04 ENCOUNTER — Ambulatory Visit (HOSPITAL_COMMUNITY)
Admission: RE | Admit: 2011-07-04 | Discharge: 2011-07-04 | Disposition: A | Discharge status: Home / Self Care | Payer: Medicare Other | Source: Ambulatory Visit | Attending: Family Medicine | Admitting: Family Medicine

## 2011-07-04 DIAGNOSIS — Z1231 Encounter for screening mammogram for malignant neoplasm of breast: Secondary | ICD-10-CM | POA: Insufficient documentation

## 2011-07-06 ENCOUNTER — Ambulatory Visit: Payer: Medicare Other | Admitting: Physical Therapy

## 2011-07-11 ENCOUNTER — Ambulatory Visit: Payer: Medicare Other | Attending: Diagnostic Neuroimaging | Admitting: Physical Therapy

## 2011-07-11 ENCOUNTER — Ambulatory Visit: Payer: Medicare Other | Admitting: Occupational Therapy

## 2011-07-11 DIAGNOSIS — R269 Unspecified abnormalities of gait and mobility: Secondary | ICD-10-CM | POA: Insufficient documentation

## 2011-07-11 DIAGNOSIS — G20A1 Parkinson's disease without dyskinesia, without mention of fluctuations: Secondary | ICD-10-CM | POA: Insufficient documentation

## 2011-07-11 DIAGNOSIS — G20 Parkinson's disease: Secondary | ICD-10-CM | POA: Insufficient documentation

## 2011-07-11 DIAGNOSIS — IMO0001 Reserved for inherently not codable concepts without codable children: Secondary | ICD-10-CM | POA: Insufficient documentation

## 2011-07-13 ENCOUNTER — Ambulatory Visit: Payer: Medicare Other | Admitting: Occupational Therapy

## 2011-07-13 ENCOUNTER — Ambulatory Visit: Payer: Medicare Other | Admitting: Physical Therapy

## 2011-07-19 ENCOUNTER — Ambulatory Visit: Payer: Medicare Other | Admitting: Occupational Therapy

## 2011-07-20 ENCOUNTER — Ambulatory Visit: Payer: Medicare Other | Admitting: Occupational Therapy

## 2011-07-20 ENCOUNTER — Ambulatory Visit: Payer: Medicare Other | Admitting: Physical Therapy

## 2011-07-25 ENCOUNTER — Encounter: Payer: Medicare Other | Admitting: Occupational Therapy

## 2011-07-25 ENCOUNTER — Ambulatory Visit: Payer: Medicare Other | Admitting: Physical Therapy

## 2011-07-26 ENCOUNTER — Ambulatory Visit: Payer: Medicare Other | Admitting: Physical Therapy

## 2011-07-26 ENCOUNTER — Encounter: Payer: Medicare Other | Admitting: Occupational Therapy

## 2011-09-04 LAB — CBC
HCT: 26.3 — ABNORMAL LOW
HCT: 26.7 — ABNORMAL LOW
HCT: 38.6
Hemoglobin: 9.2 — ABNORMAL LOW
MCV: 94.3
MCV: 95.1
Platelets: 219
Platelets: 231
Platelets: 357
RBC: 2.84 — ABNORMAL LOW
RDW: 14.3
WBC: 10.7 — ABNORMAL HIGH
WBC: 9.7

## 2011-09-04 LAB — BASIC METABOLIC PANEL
BUN: 12
BUN: 6
BUN: 7
Chloride: 107
Chloride: 110
Creatinine, Ser: 0.73
GFR calc Af Amer: >60
GFR calc non Af Amer: >60
GFR calc non Af Amer: >60
Glucose, Bld: 92
Potassium: 4.1
Potassium: 4.1
Potassium: 4.2
Sodium: 137

## 2011-09-04 LAB — URINALYSIS, ROUTINE W REFLEX MICROSCOPIC
Hgb urine dipstick: NEGATIVE
Nitrite: NEGATIVE
Protein, ur: NEGATIVE
Urobilinogen, UA: 0.2

## 2011-09-04 LAB — DIFFERENTIAL
Basophils Absolute: 0.1
Basophils Relative: 1
Eosinophils Absolute: 0.1
Eosinophils Relative: 1
Neutrophils Relative %: 70

## 2011-09-04 LAB — URINE MICROSCOPIC-ADD ON

## 2011-09-04 LAB — HEMOGLOBIN AND HEMATOCRIT, BLOOD
HCT: 29.2 — ABNORMAL LOW
Hemoglobin: 10.1 — ABNORMAL LOW

## 2011-09-04 LAB — PROTIME-INR: Prothrombin Time: 12.8

## 2011-11-12 ENCOUNTER — Other Ambulatory Visit: Payer: Self-pay | Admitting: Neurological Surgery

## 2011-11-12 ENCOUNTER — Ambulatory Visit
Admission: RE | Admit: 2011-11-12 | Discharge: 2011-11-12 | Disposition: A | Discharge status: Home / Self Care | Payer: Medicare Other | Source: Ambulatory Visit | Attending: Neurological Surgery | Admitting: Neurological Surgery

## 2011-11-12 DIAGNOSIS — M5137 Other intervertebral disc degeneration, lumbosacral region: Secondary | ICD-10-CM

## 2011-11-12 DIAGNOSIS — M4310 Spondylolisthesis, site unspecified: Secondary | ICD-10-CM

## 2011-11-21 ENCOUNTER — Ambulatory Visit: Payer: Medicare Other | Admitting: Physical Therapy

## 2011-12-14 DIAGNOSIS — Z87311 Personal history of (healed) other pathological fracture: Secondary | ICD-10-CM | POA: Diagnosis not present

## 2011-12-14 DIAGNOSIS — M810 Age-related osteoporosis without current pathological fracture: Secondary | ICD-10-CM | POA: Diagnosis not present

## 2011-12-26 DIAGNOSIS — K449 Diaphragmatic hernia without obstruction or gangrene: Secondary | ICD-10-CM | POA: Diagnosis not present

## 2011-12-26 DIAGNOSIS — D126 Benign neoplasm of colon, unspecified: Secondary | ICD-10-CM | POA: Diagnosis not present

## 2011-12-26 DIAGNOSIS — K219 Gastro-esophageal reflux disease without esophagitis: Secondary | ICD-10-CM | POA: Diagnosis not present

## 2011-12-26 DIAGNOSIS — Z1211 Encounter for screening for malignant neoplasm of colon: Secondary | ICD-10-CM | POA: Diagnosis not present

## 2011-12-26 DIAGNOSIS — K5730 Diverticulosis of large intestine without perforation or abscess without bleeding: Secondary | ICD-10-CM | POA: Diagnosis not present

## 2011-12-26 DIAGNOSIS — D131 Benign neoplasm of stomach: Secondary | ICD-10-CM | POA: Diagnosis not present

## 2011-12-26 DIAGNOSIS — K648 Other hemorrhoids: Secondary | ICD-10-CM | POA: Diagnosis not present

## 2012-01-03 DIAGNOSIS — G5600 Carpal tunnel syndrome, unspecified upper limb: Secondary | ICD-10-CM | POA: Diagnosis not present

## 2012-01-03 DIAGNOSIS — G609 Hereditary and idiopathic neuropathy, unspecified: Secondary | ICD-10-CM | POA: Diagnosis not present

## 2012-01-03 DIAGNOSIS — IMO0002 Reserved for concepts with insufficient information to code with codable children: Secondary | ICD-10-CM | POA: Diagnosis not present

## 2012-01-04 DIAGNOSIS — Z961 Presence of intraocular lens: Secondary | ICD-10-CM | POA: Diagnosis not present

## 2012-01-04 DIAGNOSIS — H2640 Unspecified secondary cataract: Secondary | ICD-10-CM | POA: Diagnosis not present

## 2012-01-14 DIAGNOSIS — M779 Enthesopathy, unspecified: Secondary | ICD-10-CM | POA: Diagnosis not present

## 2012-01-14 DIAGNOSIS — IMO0002 Reserved for concepts with insufficient information to code with codable children: Secondary | ICD-10-CM | POA: Diagnosis not present

## 2012-01-14 DIAGNOSIS — M25579 Pain in unspecified ankle and joints of unspecified foot: Secondary | ICD-10-CM | POA: Diagnosis not present

## 2012-01-22 DIAGNOSIS — M1990 Unspecified osteoarthritis, unspecified site: Secondary | ICD-10-CM | POA: Diagnosis not present

## 2012-01-28 DIAGNOSIS — M21619 Bunion of unspecified foot: Secondary | ICD-10-CM | POA: Diagnosis not present

## 2012-01-28 DIAGNOSIS — M7150 Other bursitis, not elsewhere classified, unspecified site: Secondary | ICD-10-CM | POA: Diagnosis not present

## 2012-02-04 DIAGNOSIS — IMO0002 Reserved for concepts with insufficient information to code with codable children: Secondary | ICD-10-CM | POA: Diagnosis not present

## 2012-02-04 DIAGNOSIS — M5137 Other intervertebral disc degeneration, lumbosacral region: Secondary | ICD-10-CM | POA: Diagnosis not present

## 2012-02-06 DIAGNOSIS — M3500 Sicca syndrome, unspecified: Secondary | ICD-10-CM | POA: Diagnosis not present

## 2012-02-06 DIAGNOSIS — IMO0001 Reserved for inherently not codable concepts without codable children: Secondary | ICD-10-CM | POA: Diagnosis not present

## 2012-02-06 DIAGNOSIS — K219 Gastro-esophageal reflux disease without esophagitis: Secondary | ICD-10-CM | POA: Diagnosis not present

## 2012-02-11 ENCOUNTER — Other Ambulatory Visit: Payer: Self-pay | Admitting: Neurological Surgery

## 2012-02-11 DIAGNOSIS — M5137 Other intervertebral disc degeneration, lumbosacral region: Secondary | ICD-10-CM | POA: Diagnosis not present

## 2012-02-11 DIAGNOSIS — M545 Low back pain: Secondary | ICD-10-CM

## 2012-02-11 DIAGNOSIS — IMO0002 Reserved for concepts with insufficient information to code with codable children: Secondary | ICD-10-CM | POA: Diagnosis not present

## 2012-02-13 DIAGNOSIS — G20 Parkinson's disease: Secondary | ICD-10-CM | POA: Diagnosis not present

## 2012-02-20 ENCOUNTER — Ambulatory Visit
Admission: RE | Admit: 2012-02-20 | Discharge: 2012-02-20 | Disposition: A | Discharge status: Home / Self Care | Payer: Medicare Other | Source: Ambulatory Visit | Attending: Neurological Surgery | Admitting: Neurological Surgery

## 2012-02-20 DIAGNOSIS — M5126 Other intervertebral disc displacement, lumbar region: Secondary | ICD-10-CM | POA: Diagnosis not present

## 2012-02-20 DIAGNOSIS — M545 Low back pain: Secondary | ICD-10-CM

## 2012-02-20 DIAGNOSIS — M5137 Other intervertebral disc degeneration, lumbosacral region: Secondary | ICD-10-CM | POA: Diagnosis not present

## 2012-02-20 MED ORDER — GADOBENATE DIMEGLUMINE 529 MG/ML IV SOLN
15.0000 mL | Freq: Once | INTRAVENOUS | Status: AC | PRN
  Administered 2012-02-20: 15 mL via INTRAVENOUS

## 2012-02-25 DIAGNOSIS — M5137 Other intervertebral disc degeneration, lumbosacral region: Secondary | ICD-10-CM | POA: Diagnosis not present

## 2012-03-05 ENCOUNTER — Encounter (HOSPITAL_COMMUNITY): Payer: Self-pay

## 2012-03-05 DIAGNOSIS — G589 Mononeuropathy, unspecified: Secondary | ICD-10-CM | POA: Diagnosis not present

## 2012-03-05 DIAGNOSIS — E039 Hypothyroidism, unspecified: Secondary | ICD-10-CM | POA: Diagnosis not present

## 2012-03-05 DIAGNOSIS — I10 Essential (primary) hypertension: Secondary | ICD-10-CM | POA: Diagnosis not present

## 2012-03-05 DIAGNOSIS — E782 Mixed hyperlipidemia: Secondary | ICD-10-CM | POA: Diagnosis not present

## 2012-03-10 HISTORY — PX: SPINE SURGERY: SHX786

## 2012-03-14 ENCOUNTER — Encounter (HOSPITAL_COMMUNITY): Payer: Self-pay

## 2012-03-14 ENCOUNTER — Other Ambulatory Visit: Payer: Self-pay

## 2012-03-14 ENCOUNTER — Ambulatory Visit (HOSPITAL_COMMUNITY)
Admission: RE | Admit: 2012-03-14 | Discharge: 2012-03-14 | Disposition: A | Discharge status: Home / Self Care | Payer: Medicare Other | Source: Ambulatory Visit | Attending: Neurological Surgery | Admitting: Neurological Surgery

## 2012-03-14 ENCOUNTER — Encounter (HOSPITAL_COMMUNITY)
Admission: RE | Admit: 2012-03-14 | Discharge: 2012-03-14 | Disposition: A | Discharge status: Home / Self Care | Payer: Medicare Other | Source: Ambulatory Visit | Attending: Neurological Surgery | Admitting: Neurological Surgery

## 2012-03-14 DIAGNOSIS — I10 Essential (primary) hypertension: Secondary | ICD-10-CM | POA: Insufficient documentation

## 2012-03-14 DIAGNOSIS — R0602 Shortness of breath: Secondary | ICD-10-CM | POA: Diagnosis not present

## 2012-03-14 DIAGNOSIS — I517 Cardiomegaly: Secondary | ICD-10-CM | POA: Diagnosis not present

## 2012-03-14 DIAGNOSIS — I7090 Unspecified atherosclerosis: Secondary | ICD-10-CM | POA: Insufficient documentation

## 2012-03-14 DIAGNOSIS — M5126 Other intervertebral disc displacement, lumbar region: Secondary | ICD-10-CM | POA: Diagnosis not present

## 2012-03-14 DIAGNOSIS — R9389 Abnormal findings on diagnostic imaging of other specified body structures: Secondary | ICD-10-CM | POA: Diagnosis not present

## 2012-03-14 HISTORY — DX: Unspecified osteoarthritis, unspecified site: M19.90

## 2012-03-14 HISTORY — DX: Essential (primary) hypertension: I10

## 2012-03-14 HISTORY — DX: Polyneuropathy, unspecified: G62.9

## 2012-03-14 HISTORY — DX: Anxiety disorder, unspecified: F41.9

## 2012-03-14 HISTORY — DX: Personal history of other diseases of the digestive system: Z87.19

## 2012-03-14 HISTORY — DX: Tremor, unspecified: R25.1

## 2012-03-14 HISTORY — DX: Hypothyroidism, unspecified: E03.9

## 2012-03-14 HISTORY — DX: Shortness of breath: R06.02

## 2012-03-14 LAB — DIFFERENTIAL
Basophils Absolute: 0 10*3/uL (ref 0.0–0.1)
Eosinophils Absolute: 0.3 10*3/uL (ref 0.0–0.7)
Eosinophils Relative: 3 % (ref 0–5)
Lymphocytes Relative: 23 % (ref 12–46)
Lymphs Abs: 2 10*3/uL (ref 0.7–4.0)
Neutrophils Relative %: 66 % (ref 43–77)

## 2012-03-14 LAB — CBC
MCV: 90.3 fL (ref 78.0–100.0)
Platelets: 345 10*3/uL (ref 150–400)
RBC: 4.21 MIL/uL (ref 3.87–5.11)
RDW: 14.2 % (ref 11.5–15.5)
WBC: 8.6 10*3/uL (ref 4.0–10.5)

## 2012-03-14 LAB — APTT: aPTT: 30 seconds (ref 24–37)

## 2012-03-14 LAB — PROTIME-INR
INR: 1.06 (ref 0.00–1.49)
Prothrombin Time: 14 seconds (ref 11.6–15.2)

## 2012-03-14 LAB — SURGICAL PCR SCREEN
MRSA, PCR: NEGATIVE
Staphylococcus aureus: NEGATIVE

## 2012-03-14 LAB — BASIC METABOLIC PANEL
Calcium: 9.5 mg/dL (ref 8.4–10.5)
Creatinine, Ser: 0.69 mg/dL (ref 0.50–1.10)
GFR calc non Af Amer: 81 mL/min — ABNORMAL LOW (ref >90)
Sodium: 141 mEq/L (ref 135–145)

## 2012-03-14 LAB — TYPE AND SCREEN: Antibody Screen: NEGATIVE

## 2012-03-14 NOTE — Pre-Procedure Instructions (Signed)
20 [name]  03/14/2012   Your procedure is scheduled on:  03/20/12  Report to [facility] at 1130 AM.  Call this number if you have problems the morning of surgery: [phone]   Remember:   Do not eat food:After Midnight.  May have clear liquids: up to 4 Hours before arrival.  Clear liquids include soda, tea, black coffee, apple or grape juice, broth.  Take these medicines the morning of surgery with A SIP OF WATER: dexilant,synthroid,toprol,miapex,lyrica,diliazem   Do not wear jewelry, make-up or nail polish.  Do not wear lotions, powders, or perfumes. You may wear deodorant.  Do not shave 48 hours prior to surgery.  Do not bring valuables to the hospital.  Contacts, dentures or bridgework may not be worn into surgery.  Leave suitcase in the car. After surgery it may be brought to your room.  For patients admitted to the hospital, checkout time is 11:00 AM the day of discharge.   Patients discharged the day of surgery will not be allowed to drive home.  Name and phone number of your driver: husband  Special Instructions: CHG Shower Use Special Wash: 1/2 bottle night before surgery and 1/2 bottle morning of surgery.   Please read over the following fact sheets that you were given: Pain Booklet, Coughing and Deep Breathing, Blood Transfusion Information, MRSA Information and Surgical Site Infection Prevention

## 2012-03-14 NOTE — Progress Notes (Signed)
No current cardiac info.  Cath over 6 yrs ago.

## 2012-03-14 NOTE — Pre-Procedure Instructions (Signed)
20 RAMIA SIDNEY  03/14/2012   Your procedure is scheduled on:  Thurs, April 11 @ 1:30 PM  Report to [facility] at 1130 AM.  Call this number if you have problems the morning of surgery: [phone]   Remember:   Do not eat food:After Midnight.  May have clear liquids: up to 4 Hours before arrival.(until 7:30 am)  Clear liquids include soda, tea, black coffee, apple or grape juice, broth,water  Take these medicines the morning of surgery with A SIP OF WATER: Dexilant,Levothyroxine,Mirapex,Lyrica   Do not wear jewelry, make-up or nail polish.  Do not wear lotions, powders, or perfumes.   Do not shave 48 hours prior to surgery.  Do not bring valuables to the hospital.  Contacts, dentures or bridgework may not be worn into surgery.  Leave suitcase in the car. After surgery it may be brought to your room.  For patients admitted to the hospital, checkout time is 11:00 AM the day of discharge.     Please read over the following fact sheets that you were given:

## 2012-03-17 DIAGNOSIS — I5032 Chronic diastolic (congestive) heart failure: Secondary | ICD-10-CM | POA: Diagnosis not present

## 2012-03-17 DIAGNOSIS — I119 Hypertensive heart disease without heart failure: Secondary | ICD-10-CM | POA: Diagnosis not present

## 2012-03-19 MED ORDER — DEXAMETHASONE SODIUM PHOSPHATE 10 MG/ML IJ SOLN
10.0000 mg | Freq: Once | INTRAMUSCULAR | Status: DC
  Filled 2012-03-19: qty 1

## 2012-03-19 MED ORDER — CEFAZOLIN SODIUM 1-5 GM-% IV SOLN
1.0000 g | INTRAVENOUS | Status: DC
  Filled 2012-03-19: qty 50

## 2012-03-20 ENCOUNTER — Inpatient Hospital Stay (HOSPITAL_COMMUNITY): Payer: Medicare Other | Admitting: Anesthesiology

## 2012-03-20 ENCOUNTER — Encounter (HOSPITAL_COMMUNITY): Payer: Self-pay | Admitting: Anesthesiology

## 2012-03-20 ENCOUNTER — Encounter (HOSPITAL_COMMUNITY)
Admission: RE | Disposition: A | Discharge status: Home / Self Care | Payer: Self-pay | Source: Ambulatory Visit | Attending: Neurological Surgery

## 2012-03-20 ENCOUNTER — Inpatient Hospital Stay (HOSPITAL_COMMUNITY): Payer: Medicare Other

## 2012-03-20 ENCOUNTER — Inpatient Hospital Stay (HOSPITAL_COMMUNITY)
Admission: RE | Admit: 2012-03-20 | Discharge: 2012-03-26 | DRG: 460 | Disposition: A | Discharge status: Skilled Nursing Facility | Payer: Medicare Other | Source: Ambulatory Visit | Attending: Neurological Surgery | Admitting: Neurological Surgery

## 2012-03-20 ENCOUNTER — Encounter (HOSPITAL_COMMUNITY): Payer: Self-pay

## 2012-03-20 DIAGNOSIS — Z471 Aftercare following joint replacement surgery: Secondary | ICD-10-CM | POA: Diagnosis not present

## 2012-03-20 DIAGNOSIS — M5126 Other intervertebral disc displacement, lumbar region: Principal | ICD-10-CM | POA: Diagnosis present

## 2012-03-20 DIAGNOSIS — K219 Gastro-esophageal reflux disease without esophagitis: Secondary | ICD-10-CM | POA: Diagnosis present

## 2012-03-20 DIAGNOSIS — M5137 Other intervertebral disc degeneration, lumbosacral region: Secondary | ICD-10-CM | POA: Diagnosis not present

## 2012-03-20 DIAGNOSIS — Z472 Encounter for removal of internal fixation device: Secondary | ICD-10-CM | POA: Diagnosis not present

## 2012-03-20 DIAGNOSIS — M4310 Spondylolisthesis, site unspecified: Secondary | ICD-10-CM | POA: Diagnosis present

## 2012-03-20 DIAGNOSIS — M810 Age-related osteoporosis without current pathological fracture: Secondary | ICD-10-CM | POA: Diagnosis not present

## 2012-03-20 DIAGNOSIS — I10 Essential (primary) hypertension: Secondary | ICD-10-CM | POA: Diagnosis present

## 2012-03-20 DIAGNOSIS — Q762 Congenital spondylolisthesis: Secondary | ICD-10-CM | POA: Diagnosis not present

## 2012-03-20 DIAGNOSIS — E785 Hyperlipidemia, unspecified: Secondary | ICD-10-CM | POA: Diagnosis not present

## 2012-03-20 DIAGNOSIS — E559 Vitamin D deficiency, unspecified: Secondary | ICD-10-CM | POA: Diagnosis not present

## 2012-03-20 DIAGNOSIS — R7989 Other specified abnormal findings of blood chemistry: Secondary | ICD-10-CM | POA: Diagnosis not present

## 2012-03-20 DIAGNOSIS — Z981 Arthrodesis status: Secondary | ICD-10-CM | POA: Diagnosis not present

## 2012-03-20 DIAGNOSIS — K449 Diaphragmatic hernia without obstruction or gangrene: Secondary | ICD-10-CM | POA: Diagnosis present

## 2012-03-20 DIAGNOSIS — K5900 Constipation, unspecified: Secondary | ICD-10-CM | POA: Diagnosis not present

## 2012-03-20 DIAGNOSIS — M48061 Spinal stenosis, lumbar region without neurogenic claudication: Secondary | ICD-10-CM | POA: Diagnosis not present

## 2012-03-20 DIAGNOSIS — E039 Hypothyroidism, unspecified: Secondary | ICD-10-CM | POA: Diagnosis not present

## 2012-03-20 DIAGNOSIS — D649 Anemia, unspecified: Secondary | ICD-10-CM | POA: Diagnosis not present

## 2012-03-20 DIAGNOSIS — M6281 Muscle weakness (generalized): Secondary | ICD-10-CM | POA: Diagnosis not present

## 2012-03-20 DIAGNOSIS — IMO0002 Reserved for concepts with insufficient information to code with codable children: Secondary | ICD-10-CM | POA: Diagnosis not present

## 2012-03-20 DIAGNOSIS — M961 Postlaminectomy syndrome, not elsewhere classified: Secondary | ICD-10-CM | POA: Diagnosis not present

## 2012-03-20 DIAGNOSIS — R269 Unspecified abnormalities of gait and mobility: Secondary | ICD-10-CM | POA: Diagnosis not present

## 2012-03-20 SURGERY — TRANSFORAMINAL LUMBAR INTERBODY FUSION (TLIF) WITH PEDICLE SCREW FIXATION 2 LEVEL
Anesthesia: General | Site: Spine Lumbar | Laterality: Bilateral | Wound class: Clean

## 2012-03-20 MED ORDER — ASPIRIN EC 81 MG PO TBEC
81.0000 mg | DELAYED_RELEASE_TABLET | Freq: Every day | ORAL | Status: DC
  Administered 2012-03-21 – 2012-03-26 (×6): 81 mg via ORAL
  Filled 2012-03-20 (×6): qty 1

## 2012-03-20 MED ORDER — 0.9 % SODIUM CHLORIDE (POUR BTL) OPTIME
TOPICAL | Status: DC | PRN
  Administered 2012-03-20: 1000 mL

## 2012-03-20 MED ORDER — LACTATED RINGERS IV SOLN
INTRAVENOUS | Status: DC | PRN
  Administered 2012-03-20 (×4): via INTRAVENOUS

## 2012-03-20 MED ORDER — CEFAZOLIN SODIUM 1-5 GM-% IV SOLN
1.0000 g | Freq: Three times a day (TID) | INTRAVENOUS | Status: AC
  Administered 2012-03-20 – 2012-03-21 (×2): 1 g via INTRAVENOUS
  Filled 2012-03-20 (×2): qty 50

## 2012-03-20 MED ORDER — THROMBIN 20000 UNITS EX KIT
PACK | CUTANEOUS | Status: DC | PRN
  Administered 2012-03-20: 16:00:00 via TOPICAL

## 2012-03-20 MED ORDER — OXYCODONE-ACETAMINOPHEN 5-325 MG PO TABS
1.0000 | ORAL_TABLET | ORAL | Status: DC | PRN
  Administered 2012-03-21 (×3): 2 via ORAL
  Administered 2012-03-22 – 2012-03-23 (×3): 1 via ORAL
  Filled 2012-03-20 (×2): qty 2
  Filled 2012-03-20: qty 1
  Filled 2012-03-20: qty 2
  Filled 2012-03-20: qty 1
  Filled 2012-03-20: qty 2
  Filled 2012-03-20: qty 1

## 2012-03-20 MED ORDER — SODIUM CHLORIDE 0.9 % IJ SOLN: 3.0000 mL | INTRAMUSCULAR | Status: DC | PRN

## 2012-03-20 MED ORDER — FENTANYL CITRATE 0.05 MG/ML IJ SOLN
INTRAMUSCULAR | Status: DC | PRN
  Administered 2012-03-20 (×2): 25 ug via INTRAVENOUS
  Administered 2012-03-20: 100 ug via INTRAVENOUS
  Administered 2012-03-20 (×2): 25 ug via INTRAVENOUS
  Administered 2012-03-20: 50 ug via INTRAVENOUS
  Administered 2012-03-20 (×3): 25 ug via INTRAVENOUS
  Administered 2012-03-20 (×2): 50 ug via INTRAVENOUS

## 2012-03-20 MED ORDER — PANTOPRAZOLE SODIUM 40 MG PO TBEC
40.0000 mg | DELAYED_RELEASE_TABLET | Freq: Every day | ORAL | Status: DC
  Administered 2012-03-20 – 2012-03-26 (×7): 40 mg via ORAL
  Filled 2012-03-20 (×3): qty 1

## 2012-03-20 MED ORDER — DEXAMETHASONE SODIUM PHOSPHATE 10 MG/ML IJ SOLN
INTRAMUSCULAR | Status: DC | PRN
  Administered 2012-03-20: 10 mg via INTRAVENOUS

## 2012-03-20 MED ORDER — ROCURONIUM BROMIDE 100 MG/10ML IV SOLN
INTRAVENOUS | Status: DC | PRN
  Administered 2012-03-20: 50 mg via INTRAVENOUS

## 2012-03-20 MED ORDER — EZETIMIBE 10 MG PO TABS
10.0000 mg | ORAL_TABLET | Freq: Every evening | ORAL | Status: DC
  Administered 2012-03-21 – 2012-03-25 (×5): 10 mg via ORAL
  Filled 2012-03-20 (×6): qty 1

## 2012-03-20 MED ORDER — SODIUM CHLORIDE 0.9 % IV SOLN
INTRAVENOUS | Status: AC
  Filled 2012-03-20: qty 500

## 2012-03-20 MED ORDER — MORPHINE SULFATE 4 MG/ML IJ SOLN
1.0000 mg | INTRAMUSCULAR | Status: DC | PRN
  Administered 2012-03-20 – 2012-03-21 (×2): 1 mg via INTRAVENOUS
  Filled 2012-03-20 (×2): qty 1

## 2012-03-20 MED ORDER — PHENYLEPHRINE HCL 10 MG/ML IJ SOLN
INTRAMUSCULAR | Status: DC | PRN
  Administered 2012-03-20: 80 ug via INTRAVENOUS

## 2012-03-20 MED ORDER — SODIUM CHLORIDE 0.9 % IR SOLN
Status: DC | PRN
  Administered 2012-03-20: 16:00:00

## 2012-03-20 MED ORDER — HYDROMORPHONE HCL PF 1 MG/ML IJ SOLN
INTRAMUSCULAR | Status: AC
  Administered 2012-03-20: 0.25 mg via INTRAVENOUS
  Filled 2012-03-20: qty 1

## 2012-03-20 MED ORDER — BUPIVACAINE HCL (PF) 0.25 % IJ SOLN
INTRAMUSCULAR | Status: DC | PRN
  Administered 2012-03-20: 5 mL

## 2012-03-20 MED ORDER — MENTHOL 3 MG MT LOZG
1.0000 | LOZENGE | OROMUCOSAL | Status: DC | PRN
  Filled 2012-03-20: qty 9

## 2012-03-20 MED ORDER — DILTIAZEM HCL ER BEADS 120 MG PO CP24
120.0000 mg | ORAL_CAPSULE | Freq: Every evening | ORAL | Status: DC
  Administered 2012-03-21 – 2012-03-24 (×4): 120 mg via ORAL
  Filled 2012-03-20 (×6): qty 1

## 2012-03-20 MED ORDER — PHENOL 1.4 % MT LIQD: 1.0000 | OROMUCOSAL | Status: DC | PRN

## 2012-03-20 MED ORDER — ZOLPIDEM TARTRATE 5 MG PO TABS: 5.0000 mg | ORAL_TABLET | Freq: Every evening | ORAL | Status: DC | PRN

## 2012-03-20 MED ORDER — ACETAMINOPHEN 325 MG PO TABS
650.0000 mg | ORAL_TABLET | ORAL | Status: DC | PRN
  Administered 2012-03-25 – 2012-03-26 (×2): 650 mg via ORAL

## 2012-03-20 MED ORDER — CEFAZOLIN SODIUM 1-5 GM-% IV SOLN
INTRAVENOUS | Status: DC | PRN
  Administered 2012-03-20 (×2): 1 g via INTRAVENOUS

## 2012-03-20 MED ORDER — DROPERIDOL 2.5 MG/ML IJ SOLN
INTRAMUSCULAR | Status: DC | PRN
  Administered 2012-03-20: .6 mg via INTRAVENOUS

## 2012-03-20 MED ORDER — CEFAZOLIN SODIUM 1-5 GM-% IV SOLN
INTRAVENOUS | Status: AC
  Filled 2012-03-20: qty 50

## 2012-03-20 MED ORDER — MIDAZOLAM HCL 5 MG/5ML IJ SOLN
INTRAMUSCULAR | Status: DC | PRN
  Administered 2012-03-20: 2 mg via INTRAVENOUS

## 2012-03-20 MED ORDER — ADULT MULTIVITAMIN W/MINERALS CH
1.0000 | ORAL_TABLET | Freq: Every day | ORAL | Status: DC
  Administered 2012-03-21 – 2012-03-26 (×6): 1 via ORAL
  Filled 2012-03-20 (×6): qty 1

## 2012-03-20 MED ORDER — PRAMIPEXOLE DIHYDROCHLORIDE 1 MG PO TABS
1.0000 mg | ORAL_TABLET | Freq: Four times a day (QID) | ORAL | Status: DC
  Administered 2012-03-20 – 2012-03-26 (×21): 1 mg via ORAL
  Filled 2012-03-20 (×25): qty 1

## 2012-03-20 MED ORDER — ONDANSETRON HCL 4 MG/2ML IJ SOLN
INTRAMUSCULAR | Status: DC | PRN
  Administered 2012-03-20: 4 mg via INTRAVENOUS

## 2012-03-20 MED ORDER — POTASSIUM CHLORIDE IN NACL 20-0.9 MEQ/L-% IV SOLN
INTRAVENOUS | Status: DC
  Administered 2012-03-20: 23:00:00 via INTRAVENOUS
  Filled 2012-03-20 (×11): qty 1000

## 2012-03-20 MED ORDER — SENNA 8.6 MG PO TABS
1.0000 | ORAL_TABLET | Freq: Two times a day (BID) | ORAL | Status: DC
  Administered 2012-03-20 – 2012-03-26 (×12): 8.6 mg via ORAL
  Filled 2012-03-20 (×13): qty 1

## 2012-03-20 MED ORDER — SODIUM CHLORIDE 0.9 % IJ SOLN
3.0000 mL | Freq: Two times a day (BID) | INTRAMUSCULAR | Status: DC
  Administered 2012-03-21 – 2012-03-25 (×8): 3 mL via INTRAVENOUS

## 2012-03-20 MED ORDER — PROPOFOL 10 MG/ML IV EMUL
INTRAVENOUS | Status: DC | PRN
  Administered 2012-03-20: 30 mg via INTRAVENOUS
  Administered 2012-03-20: 110 mg via INTRAVENOUS

## 2012-03-20 MED ORDER — PREGABALIN 75 MG PO CAPS
75.0000 mg | ORAL_CAPSULE | Freq: Two times a day (BID) | ORAL | Status: DC
  Administered 2012-03-20 – 2012-03-26 (×12): 75 mg via ORAL
  Filled 2012-03-20 (×12): qty 1

## 2012-03-20 MED ORDER — LIDOCAINE HCL (CARDIAC) 20 MG/ML IV SOLN
INTRAVENOUS | Status: DC | PRN
  Administered 2012-03-20: 60 mg via INTRAVENOUS

## 2012-03-20 MED ORDER — SIMVASTATIN 5 MG PO TABS
5.0000 mg | ORAL_TABLET | Freq: Every day | ORAL | Status: DC
  Administered 2012-03-21 – 2012-03-25 (×5): 5 mg via ORAL
  Filled 2012-03-20 (×6): qty 1

## 2012-03-20 MED ORDER — HYDROMORPHONE HCL PF 1 MG/ML IJ SOLN
0.2500 mg | INTRAMUSCULAR | Status: DC | PRN
  Administered 2012-03-20 (×2): 0.25 mg via INTRAVENOUS
  Administered 2012-03-20: 0.5 mg via INTRAVENOUS

## 2012-03-20 MED ORDER — DOCUSATE SODIUM 100 MG PO CAPS
100.0000 mg | ORAL_CAPSULE | Freq: Every day | ORAL | Status: DC
  Administered 2012-03-21 – 2012-03-25 (×5): 100 mg via ORAL
  Filled 2012-03-20 (×5): qty 1

## 2012-03-20 MED ORDER — BACITRACIN 50000 UNITS IM SOLR
INTRAMUSCULAR | Status: AC
  Filled 2012-03-20: qty 1

## 2012-03-20 MED ORDER — ACETAMINOPHEN 650 MG RE SUPP: 650.0000 mg | RECTAL | Status: DC | PRN

## 2012-03-20 MED ORDER — METOPROLOL SUCCINATE ER 50 MG PO TB24
50.0000 mg | ORAL_TABLET | Freq: Every evening | ORAL | Status: DC
  Administered 2012-03-22 – 2012-03-24 (×3): 50 mg via ORAL
  Filled 2012-03-20 (×6): qty 1

## 2012-03-20 MED ORDER — LEVOTHYROXINE SODIUM 50 MCG PO TABS
50.0000 ug | ORAL_TABLET | Freq: Every day | ORAL | Status: DC
  Administered 2012-03-21 – 2012-03-26 (×6): 50 ug via ORAL
  Filled 2012-03-20 (×6): qty 1

## 2012-03-20 MED ORDER — HETASTARCH-ELECTROLYTES 6 % IV SOLN
INTRAVENOUS | Status: DC | PRN
  Administered 2012-03-20: 18:00:00 via INTRAVENOUS

## 2012-03-20 MED ORDER — SODIUM CHLORIDE 0.9 % IV SOLN: 250.0000 mL | INTRAVENOUS | Status: DC

## 2012-03-20 MED ORDER — EPHEDRINE SULFATE 50 MG/ML IJ SOLN
INTRAMUSCULAR | Status: DC | PRN
  Administered 2012-03-20 (×5): 10 mg via INTRAVENOUS

## 2012-03-20 MED ORDER — ONDANSETRON HCL 4 MG/2ML IJ SOLN
4.0000 mg | INTRAMUSCULAR | Status: DC | PRN
  Administered 2012-03-21: 4 mg via INTRAVENOUS
  Filled 2012-03-20: qty 2

## 2012-03-20 SURGICAL SUPPLY — 57 items
APL SKNCLS STERI-STRIP NONHPOA (GAUZE/BANDAGES/DRESSINGS) ×1
BAG DECANTER FOR FLEXI CONT (MISCELLANEOUS) ×2 IMPLANT
BENZOIN TINCTURE PRP APPL 2/3 (GAUZE/BANDAGES/DRESSINGS) ×2 IMPLANT
BLADE SURG ROTATE 9660 (MISCELLANEOUS) IMPLANT
BUR MATCHSTICK NEURO 3.0 LAGG (BURR) ×2 IMPLANT
CAGE CONCORDE BULLET 9X10X23 (Cage) ×2 IMPLANT
CAGE SPNL 5D BLT NOSE 23X9X10 (Cage) IMPLANT
CANISTER SUCTION 2500CC (MISCELLANEOUS) ×2 IMPLANT
CLOTH BEACON ORANGE TIMEOUT ST (SAFETY) ×2 IMPLANT
CONT SPEC 4OZ CLIKSEAL STRL BL (MISCELLANEOUS) ×4 IMPLANT
COVER BACK TABLE 24X17X13 BIG (DRAPES) IMPLANT
COVER TABLE BACK 60X90 (DRAPES) ×2 IMPLANT
DRAPE C-ARM 42X72 X-RAY (DRAPES) ×4 IMPLANT
DRAPE C-ARMOR (DRAPES) ×1 IMPLANT
DRAPE LAPAROTOMY 100X72X124 (DRAPES) ×2 IMPLANT
DRAPE POUCH INSTRU U-SHP 10X18 (DRAPES) ×2 IMPLANT
DRAPE SURG 17X23 STRL (DRAPES) ×2 IMPLANT
DRESSING TELFA 8X3 (GAUZE/BANDAGES/DRESSINGS) ×2 IMPLANT
DRSG OPSITE 4X5.5 SM (GAUZE/BANDAGES/DRESSINGS) ×4 IMPLANT
DURAPREP 26ML APPLICATOR (WOUND CARE) ×2 IMPLANT
ELECT REM PT RETURN 9FT ADLT (ELECTROSURGICAL) ×2
ELECTRODE REM PT RTRN 9FT ADLT (ELECTROSURGICAL) ×1 IMPLANT
EVACUATOR 1/8 PVC DRAIN (DRAIN) ×2 IMPLANT
GAUZE SPONGE 4X4 16PLY XRAY LF (GAUZE/BANDAGES/DRESSINGS) IMPLANT
GLOVE BIO SURGEON STRL SZ8 (GLOVE) ×5 IMPLANT
GLOVE BIOGEL PI IND STRL 8.5 (GLOVE) IMPLANT
GLOVE BIOGEL PI INDICATOR 8.5 (GLOVE) ×4
GLOVE SURG SS PI 8.0 STRL IVOR (GLOVE) ×6 IMPLANT
GOWN BRE IMP SLV AUR LG STRL (GOWN DISPOSABLE) ×1 IMPLANT
GOWN BRE IMP SLV AUR XL STRL (GOWN DISPOSABLE) ×5 IMPLANT
GOWN STRL REIN 2XL LVL4 (GOWN DISPOSABLE) ×2 IMPLANT
HEMOSTAT POWDER KIT SURGIFOAM (HEMOSTASIS) IMPLANT
KIT BASIN OR (CUSTOM PROCEDURE TRAY) ×2 IMPLANT
KIT INFUSE X SMALL 1.4CC (Orthopedic Implant) ×1 IMPLANT
KIT ROOM TURNOVER OR (KITS) ×2 IMPLANT
LUMBAR L2 POLARIS 5.5 POST (Orthopedic Implant) ×1 IMPLANT
MARKER SKIN DUAL TIP RULER LAB (MISCELLANEOUS) ×1 IMPLANT
MILL MEDIUM DISP (BLADE) ×1 IMPLANT
MIX DBX 10CC 35% BONE (Bone Implant) ×1 IMPLANT
NDL HYPO 25X1 1.5 SAFETY (NEEDLE) ×1 IMPLANT
NEEDLE HYPO 25X1 1.5 SAFETY (NEEDLE) ×2 IMPLANT
NS IRRIG 1000ML POUR BTL (IV SOLUTION) ×2 IMPLANT
PACK LAMINECTOMY NEURO (CUSTOM PROCEDURE TRAY) ×2 IMPLANT
PAD ARMBOARD 7.5X6 YLW CONV (MISCELLANEOUS) ×6 IMPLANT
SPONGE LAP 4X18 X RAY DECT (DISPOSABLE) IMPLANT
SPONGE SURGIFOAM ABS GEL 100 (HEMOSTASIS) ×2 IMPLANT
STRIP CLOSURE SKIN 1/2X4 (GAUZE/BANDAGES/DRESSINGS) ×4 IMPLANT
SUT VIC AB 0 CT1 18XCR BRD8 (SUTURE) ×1 IMPLANT
SUT VIC AB 0 CT1 8-18 (SUTURE) ×4
SUT VIC AB 2-0 CP2 18 (SUTURE) ×3 IMPLANT
SUT VIC AB 3-0 SH 8-18 (SUTURE) ×4 IMPLANT
SYR 20ML ECCENTRIC (SYRINGE) ×2 IMPLANT
TOWEL OR 17X24 6PK STRL BLUE (TOWEL DISPOSABLE) ×2 IMPLANT
TOWEL OR 17X26 10 PK STRL BLUE (TOWEL DISPOSABLE) ×2 IMPLANT
TRAP SPECIMEN MUCOUS 40CC (MISCELLANEOUS) ×1 IMPLANT
TRAY FOLEY CATH 14FRSI W/METER (CATHETERS) ×2 IMPLANT
WATER STERILE IRR 1000ML POUR (IV SOLUTION) ×2 IMPLANT

## 2012-03-20 NOTE — Anesthesia Postprocedure Evaluation (Signed)
Anesthesia Post Note  Patient: [name]  Procedure(s) Performed: Procedure(s) (LRB): TRANSFORAMINAL LUMBAR INTERBODY FUSION (TLIF) WITH PEDICLE SCREW FIXATION 2 LEVEL (Bilateral)  Anesthesia type: general  Patient location: PACU  Post pain: Pain level controlled  Post assessment: Patient's Cardiovascular Status Stable  Last Vitals:  Filed Vitals:   03/20/12 2000  BP: 131/65  Pulse: 101  Temp:   Resp: 11    Post vital signs: Reviewed and stable  Level of consciousness: sedated  Complications: No apparent anesthesia complications

## 2012-03-20 NOTE — Op Note (Signed)
03/20/2012  7:10 PM  PATIENT:  [name]  78 y.o. female  PRE-OPERATIVE DIAGNOSIS:  Adjacent level spinal stenosis L4-5 with spondylolisthesis and right-sided disc protrusion in right L4 and L5 radiculopathies  POST-OPERATIVE DIAGNOSIS:  Same  PROCEDURE:   1. Decompressive lumbar laminectomy L4-5 on the right requiring more work than would be required of the typical TLIF procedure in order to adequately decompress the neural elements. A large free fragment pressing both the L4 and L5 nerve roots was removed after a hemi-facetectomy. 2. transforaminal lumbar interbody fusion L4-5 on the using a 10 mm carbon fiber interbody cage packed with morcellized allograft and autograft and an allograft wedge.  3. Posterior fixation L2-S1 using Biomet pedicle screws.  4. Intertransverse arthrodesis L3-4 L4-5 using morcellized autograft and allograft. 5. removal of hardware L5-S1 and L2-3 with replacement of hardware at those levels  SURGEON:  Marikay Alar, MD  ASSISTANTS: Dr. Venetia Maxon  ANESTHESIA:  General  EBL: 200 ml     BLOOD ADMINISTERED:none  DRAINS: Hemovac   INDICATION FOR PROCEDURE: This is an old patient of mine her previous L2-3 and L5-S1 PLIF, recently developed severe right leg pain in an L4 and L5 distribution. MRI showed a large herniated disc L4 from the right with a large free fragment extending superiorly to decompress both nerve roots. Spondylolisthesis at this level. She tried medical management without relief. Recommended extension of her fusion to L3-4 and L4-5.  Patient understood the risks, benefits, and alternatives and potential outcomes and wished to proceed.  PROCEDURE DETAILS:  The patient was brought to the operating room. After induction of generalized endotracheal anesthesia the patient was rolled into the prone position on chest rolls and all pressure points were padded. The patient's lumbar region was cleaned and then prepped with DuraPrep and draped in the  usual sterile fashion. Anesthesia was injected and then a dorsal midline incision was made and carried down to the lumbosacral fascia. The fascia was opened and the paraspinous musculature was taken down in a subperiosteal fashion to expose L3-4 L4-5 as well as the old hardware L2-3 and L5-S1. I removed the locking caps and the rods L5-S1, and a removed the L2-3 hardware in its entirety. Intraoperative fluoroscopy confirmed my level, and the spinous process was removed and complete lumbar laminectomy, hemi- facetectomiy, and foraminotomies were performed at L4-5 on the right. The yellow ligament was removed to expose the underlying dura and L4 and L5 nerve roots, and generous foraminotomies were performed to adequately decompress the neural elements. Found a large herniated disc and pressing both L4 and L5 nerve roots. A hemi-facetectomy was performed in order to adequately decompress both nerve roots. Once the decompression was complete, I turned my attention to the transforaminal lumbar interbody fusion. The epidural venous vasculature was coagulated and cut sharply. Disc space was incised and the initial discectomy was performed with pituitary rongeurs. The disc space was distracted with sequential distractors to a height of 10 mm. We then used a series of scrapers and shavers to prepare the endplates for fusion. The midline was prepared with Epstein curettes. Once the complete discectomy was finished, we packed an appropriate sized carbon fiber interbody cage with local autograft and morcellized allograft, gently retracted the nerve root, and tapped the cage obliquely into position at L4-5 from the right. . The midline was packed with morselized autograft and allograft. We then turned our attention to the posterior fixation. The pedicle screw entry zones were identified utilizing surface landmarks and fluoroscopy  at L4. We probed each pedicle with the pedicle probe and tapped each pedicle with the appropriate  tap. We palpated with a ball probe to assure no break in the cortex. We then placed 6 5 x 45 mm pedicle screws into the pedicles bilaterally at L4. We then decorticated the transverse processes and laid a mixture of morcellized autograft and allograft out over these to perform intertransverse arthrodesis at L3-4 and L4-5. I then placed pedicle screws into the pedicles of L2-L3. We used the same diameter screws because her pedicles would not take larger screws, but made them 5 mm longer. We then placed lordotic rods into the multiaxial screw heads of the pedicle screws from L2-S1 and locked these in position with the locking caps and anti-torque device. We then checked our construct with AP and lateral fluoroscopy. Irrigated with copious amounts of bacitracin-containing saline solution. Placed a medium Hemovac drain through separate stab incision. Inspected the nerve roots once again to assure adequate decompression, lined to the dura with Gelfoam, and closed the muscle and the fascia with 0 Vicryl. Closed the subcutaneous tissues with 2-0 Vicryl and subcuticular tissues with 3-0 Vicryl. The skin was closed with benzoin and Steri-Strips. Dressing was then applied, the patient was awakened from general anesthesia and transported to the recovery room in stable condition. At the end of the procedure all sponge, needle and instrument counts were correct.   PLAN OF CARE: Admit to inpatient   PATIENT DISPOSITION:  PACU - hemodynamically stable.   Delay start of Pharmacological VTE agent (>24hrs) due to surgical blood loss or risk of bleeding:  yes

## 2012-03-20 NOTE — Anesthesia Preprocedure Evaluation (Addendum)
Anesthesia Evaluation  Patient identified by MRN, date of birth, ID band Patient awake    Reviewed: Allergy & Precautions, H&P , NPO status , Patient's Chart, lab work & pertinent test results, reviewed documented beta blocker date and time   History of Anesthesia Complications (+) PONV  Airway Mallampati: II TM Distance: >3 FB Neck ROM: Full    Dental No notable dental hx. (+) Teeth Intact and Dental Advisory Given,    Pulmonary shortness of breath and with exertion,  breath sounds clear to auscultation  Pulmonary exam normal       Cardiovascular hypertension, On Home Beta Blockers and Pt. on home beta blockers Rhythm:Regular Rate:Normal     Neuro/Psych  Neuromuscular disease negative psych ROS   GI/Hepatic Neg liver ROS, hiatal hernia, GERD-  Medicated and Controlled,  Endo/Other  negative endocrine ROSHypothyroidism   Renal/GU negative Renal ROS  negative genitourinary   Musculoskeletal   Abdominal   Peds  Hematology negative hematology ROS (+)   Anesthesia Other Findings   Reproductive/Obstetrics negative OB ROS                         Anesthesia Physical Anesthesia Plan  ASA: II  Anesthesia Plan: General   Post-op Pain Management:    Induction: Intravenous  Airway Management Planned: Oral ETT  Additional Equipment:   Intra-op Plan:   Post-operative Plan: Extubation in OR  Informed Consent: I have reviewed the patients History and Physical, chart, labs and discussed the procedure including the risks, benefits and alternatives for the proposed anesthesia with the patient or authorized representative who has indicated his/her understanding and acceptance.   Dental advisory given  Plan Discussed with: CRNA  Anesthesia Plan Comments:         Anesthesia Quick Evaluation

## 2012-03-20 NOTE — Anesthesia Procedure Notes (Signed)
Procedure Name: Intubation Date/Time: 03/20/2012 3:04 PM Performed by: Alanda Amass A Pre-anesthesia Checklist: Emergency Drugs available, Patient identified, Timeout performed, Suction available and Patient being monitored Patient Re-evaluated:Patient Re-evaluated prior to inductionOxygen Delivery Method: Circle system utilized Preoxygenation: Pre-oxygenation with 100% oxygen Intubation Type: IV induction Ventilation: Mask ventilation without difficulty Laryngoscope Size: 3 Grade View: Grade I Tube type: Oral Tube size: 7.5 mm Number of attempts: 1 Airway Equipment and Method: Stylet Placement Confirmation: ETT inserted through vocal cords under direct vision,  breath sounds checked- equal and bilateral and positive ETCO2 Secured at: 21 cm Tube secured with: Tape Dental Injury: Teeth and Oropharynx as per pre-operative assessment

## 2012-03-20 NOTE — Preoperative (Signed)
Beta Blockers   Reason not to administer Beta Blockers Pt took Metoprolol @ 6am today

## 2012-03-20 NOTE — Progress Notes (Signed)
Saw Dr. Mayford Knife, Cardiologist on 03/17/2012, checkup all wnl, no further workup needed.

## 2012-03-20 NOTE — H&P (Signed)
Subjective: Patient is a 78 y.o. female admitted for lumbar fusion. Onset of symptoms was several months ago, gradually worsening since that time.  The pain is rated severe, and is located at the across the lower back and radiates to leg. The pain is described as aching and throbbing and occurs intermittently. The symptoms have been progressive. Symptoms are exacerbated by exercise. MRI or CT showed adjacent level stenosis/ subluxation.   Past Medical History  Diagnosis Date  . Hypothyroidism   . Hypertension     dr t turner  . Shortness of breath   . H/O hiatal hernia   . Arthritis   . Anxiety   . Neuropathy   . Tremor   . Complication of anesthesia     nigthmares, hallucinations     Past Surgical History  Procedure Date  . Abdominal hysterectomy   . Back surgery     3 back surgeries,   lumbar fusion  . Wrist fracture surgery   . Eye surgery     cateracts  . Falls     variious fall, broken wrist,and toes  . Cardiac catheterization     clean,   7 yrs ago    Prior to Admission medications   Medication Sig Start Date End Date Taking? Authorizing Provider  aspirin EC 81 MG tablet Take 81 mg by mouth daily.   Yes Historical Provider, MD  dexlansoprazole (DEXILANT) 60 MG capsule Take 60 mg by mouth daily.   Yes Historical Provider, MD  diltiazem (TIAZAC) 120 MG 24 hr capsule Take 120 mg by mouth every evening.   Yes Historical Provider, MD  docusate sodium (COLACE) 100 MG capsule Take 100 mg by mouth at bedtime.   Yes Historical Provider, MD  ezetimibe (ZETIA) 10 MG tablet Take 10 mg by mouth every evening.   Yes Historical Provider, MD  levothyroxine (SYNTHROID, LEVOTHROID) 50 MCG tablet Take 50 mcg by mouth daily.   Yes Historical Provider, MD  metoprolol succinate (TOPROL-XL) 50 MG 24 hr tablet Take 50 mg by mouth every evening.   Yes Historical Provider, MD  Multiple Vitamin (MULITIVITAMIN WITH MINERALS) TABS Take 1 tablet by mouth daily.   Yes Historical Provider, MD    pramipexole (MIRAPEX) 1 MG tablet Take 1 mg by mouth 4 (four) times daily.   Yes Historical Provider, MD  pravastatin (PRAVACHOL) 80 MG tablet Take 80 mg by mouth every evening.   Yes Historical Provider, MD  pregabalin (LYRICA) 75 MG capsule Take 75 mg by mouth 2 (two) times daily.   Yes Historical Provider, MD   Allergies  Allergen Reactions  . Codeine     Unknown reaction  . Lactose Intolerance (Gi)     Reactions to meds that contain lactose derivitives  . Other Other (See Comments)    Pain meds received after surgery caused nightmares    History  Substance Use Topics  . Smoking status: Never Smoker   . Smokeless tobacco: Not on file  . Alcohol Use: No    History reviewed. No pertinent family history.   Review of Systems  Positive ROS: neg  All other systems have been reviewed and were otherwise negative with the exception of those mentioned in the HPI and as above.  Objective: Vital signs in last 24 hours: Temp:  [98.1 F (36.7 C)] 98.1 F (36.7 C) (04/11 1144) Pulse Rate:  [67] 67  (04/11 1144) Resp:  [18] 18  (04/11 1144) BP: (142)/(74) 142/74 mmHg (04/11 1144) SpO2:  [98 %]  98 % (04/11 1144)  General Appearance: Alert, cooperative, no distress, appears stated age Head: Normocephalic, without obvious abnormality, atraumatic Eyes: PERRL, conjunctiva/corneas clear, EOM's intact, fundi benign, both eyes      Ears: Normal TM's and external ear canals, both ears Throat: Lips, mucosa, and tongue normal; teeth and gums normal Neck: Supple, symmetrical, trachea midline, no adenopathy; thyroid: No enlargement/tenderness/nodules; no carotid bruit or JVD Back: Symmetric, no curvature, ROM normal, no CVA tenderness Lungs: Clear to auscultation bilaterally, respirations unlabored Heart: Regular rate and rhythm, S1 and S2 normal, no murmur, rub or gallop Abdomen: Soft, non-tender, bowel sounds active all four quadrants, no masses, no organomegaly Extremities: Extremities  normal, atraumatic, no cyanosis or edema Pulses: 2+ and symmetric all extremities Skin: Skin color, texture, turgor normal, no rashes or lesions  NEUROLOGIC:   Mental status: Alert and oriented x4,  no aphasia, good attention span, fund of knowledge, and memory Motor Exam - grossly normal Sensory Exam - grossly normal Reflexes: 1+ Coordination - grossly normal Gait - grossly normal Balance - grossly normal Cranial Nerves: I: smell Not tested  II: visual acuity  OS: nl    OD: nl  II: visual fields Full to confrontation  II: pupils Equal, round, reactive to light  III,VII: ptosis None  III,IV,VI: extraocular muscles  Full ROM  V: mastication Normal  V: facial light touch sensation  Normal  V,VII: corneal reflex  Present  VII: facial muscle function - upper  Normal  VII: facial muscle function - lower Normal  VIII: hearing Not tested  IX: soft palate elevation  Normal  IX,X: gag reflex Present  XI: trapezius strength  5/5  XI: sternocleidomastoid strength 5/5  XI: neck flexion strength  5/5  XII: tongue strength  Normal    Data Review Lab Results  Component Value Date   WBC 8.6 03/14/2012   HGB 12.3 03/14/2012   HCT 38.0 03/14/2012   MCV 90.3 03/14/2012   PLT 345 03/14/2012   Lab Results  Component Value Date   NA 141 03/14/2012   K 4.1 03/14/2012   CL 107 03/14/2012   CO2 23 03/14/2012   BUN 11 03/14/2012   CREATININE 0.69 03/14/2012   GLUCOSE 118* 03/14/2012   Lab Results  Component Value Date   INR 1.06 03/14/2012    Assessment/Plan: Patient admitted for lumbar fusion. Patient has failed conservative therapy.  I explained the condition and procedure to the patient and answered any questions.  Patient wishes to proceed with procedure as planned. Understands risks/ benefits and typical outcomes of procedure.   Huck Ashworth S 03/20/2012 2:07 PM

## 2012-03-20 NOTE — Transfer of Care (Signed)
Immediate Anesthesia Transfer of Care Note  Patient: [name]  Procedure(s) Performed: Procedure(s) (LRB): TRANSFORAMINAL LUMBAR INTERBODY FUSION (TLIF) WITH PEDICLE SCREW FIXATION 2 LEVEL (Bilateral)  Patient Location: PACU  Anesthesia Type: General  Level of Consciousness: awake  Airway & Oxygen Therapy: Patient Spontanous Breathing and Patient connected to nasal cannula oxygen  Post-op Assessment: Report given to PACU RN and Post -op Vital signs reviewed and stable  Post vital signs: Reviewed and stable  Complications: No apparent anesthesia complications

## 2012-03-21 LAB — CBC
HCT: 27.5 % — ABNORMAL LOW (ref 36.0–46.0)
Hemoglobin: 9.2 g/dL — ABNORMAL LOW (ref 12.0–15.0)
MCH: 29.8 pg (ref 26.0–34.0)
MCHC: 33.5 g/dL (ref 30.0–36.0)
MCV: 89 fL (ref 78.0–100.0)

## 2012-03-21 MED ORDER — DIPHENHYDRAMINE HCL 25 MG PO CAPS
25.0000 mg | ORAL_CAPSULE | Freq: Every evening | ORAL | Status: DC | PRN
Start: 2012-03-21 — End: 2012-03-26
  Administered 2012-03-21: 25 mg via ORAL
  Filled 2012-03-21: qty 1

## 2012-03-21 MED ORDER — POLYETHYLENE GLYCOL 3350 17 G PO PACK
17.0000 g | PACK | Freq: Every day | ORAL | Status: DC | PRN
  Administered 2012-03-22: 17 g via ORAL
  Filled 2012-03-21: qty 1

## 2012-03-21 MED ORDER — WHITE PETROLATUM GEL
Status: AC
  Administered 2012-03-21: 1
  Filled 2012-03-21: qty 5

## 2012-03-21 MED ORDER — TRAMADOL HCL 50 MG PO TABS
50.0000 mg | ORAL_TABLET | Freq: Three times a day (TID) | ORAL | Status: DC | PRN
  Administered 2012-03-21 – 2012-03-26 (×9): 50 mg via ORAL
  Filled 2012-03-21 (×9): qty 1

## 2012-03-21 NOTE — Progress Notes (Signed)
Met with pt and husband re hh needs. Pt has DME , will need HHPT as recommended by PT eval. Pt is resident of [facility] and wishes to use [facility] for HHPT. Unable to contact anyone there this evening, however will contact on Monday am re PT needs.  Johny Shock RN MPH Case Manager [phone]

## 2012-03-21 NOTE — Evaluation (Signed)
Occupational Therapy Evaluation Patient Details Name: [name] MRN: [id number] DOB: [birth year] Today's Date: 03/21/2012  Problem List: There is no problem list on file for this patient.   Past Medical History:  Past Medical History  Diagnosis Date  . Hypothyroidism   . Hypertension     dr t turner  . Shortness of breath   . H/O hiatal hernia   . Arthritis   . Anxiety   . Neuropathy   . Tremor   . Complication of anesthesia     nigthmares, hallucinations    Past Surgical History:  Past Surgical History  Procedure Date  . Abdominal hysterectomy   . Back surgery     3 back surgeries,   lumbar fusion  . Wrist fracture surgery   . Eye surgery     cateracts  . Falls     variious fall, broken wrist,and toes  . Cardiac catheterization     clean,   7 yrs ago    OT Assessment/Plan/Recommendation OT Assessment Clinical Impression Statement: 78 yo female s/p TLIF that coudl benefit from skilled OT. Pt progresssing well. OT Recommendation/Assessment: Patient will need skilled OT in the acute care venue OT Problem List: Decreased strength;Decreased activity tolerance;Impaired balance (sitting and/or standing);Decreased knowledge of use of DME or AE;Decreased knowledge of precautions;Pain OT Therapy Diagnosis : Generalized weakness;Acute pain OT Plan OT Frequency: Min 2X/week OT Treatment/Interventions: Self-care/ADL training;DME and/or AE instruction;Therapeutic activities;Patient/family education;Balance training OT Recommendation Follow Up Recommendations: No OT follow up Equipment Recommended: None recommended by OT Individuals Consulted Consulted and Agree with Results and Recommendations: Patient OT Goals Acute Rehab OT Goals OT Goal Formulation: With patient Time For Goal Achievement: 2 weeks ADL Goals Pt Will Perform Lower Body Dressing: with modified independence;Sit to stand from chair;Sit to stand from bed ADL Goal: Lower Body Dressing - Progress: Goal  set today Pt Will Transfer to Toilet: with modified independence;3-in-1 ADL Goal: Toilet Transfer - Progress: Goal set today Pt Will Perform Toileting - Clothing Manipulation: with modified independence;Sitting on 3-in-1 or toilet ADL Goal: Toileting - Clothing Manipulation - Progress: Goal set today Pt Will Perform Toileting - Hygiene: with modified independence;Sit to stand from 3-in-1/toilet ADL Goal: Toileting - Hygiene - Progress: Goal set today Miscellaneous OT Goals Miscellaneous OT Goal #1: pT WILL DON/ DOFF BRACE MOD I AT EOB AS PRECURSOR TO ADLS OT Goal: Miscellaneous Goal #1 - Progress: Goal set today Miscellaneous OT Goal #2: Pt will verbalize back precautions 3 out 3 as precursor to adls OT Goal: Miscellaneous Goal #2 - Progress: Goal set today  OT Evaluation Precautions/Restrictions  Precautions Precautions: Back Precaution Booklet Issued: Yes (comment) Precaution Comments: back handout Required Braces or Orthoses: Spinal Brace Spinal Brace: Lumbar corset;Applied in sitting position Restrictions Weight Bearing Restrictions: No Prior Functioning Home Living Lives With: Spouse Type of Home: Apartment Home Access: Level entry Home Layout: One level Bathroom Shower/Tub: Walk-in shower;Door [employer] Toilet: Handicapped height Bathroom Accessibility: Yes How Accessible: Accessible via walker Home Adaptive Equipment: Reacher;Long-handled shoehorn;Long-handled sponge;Hand-held shower hose;Bedside commode/3-in-1;Sock aid;Straight cane;Walker - rolling Prior Function Level of Independence: Independent Vocation: Retired  ADL ADL Eating/Feeding: Simulated;Set up Where Assessed - Eating/Feeding: Chair Upper Body Dressing: Performed;Minimal assistance (educated on don brace without twisting) Where Assessed - Upper Body Dressing: Sitting, bed;Unsupported ADL Comments: Pt educated on back precautions and provided handout. pt with previous back surgery and recalling bed  mobility. Vision/Perception    Cognition Cognition Arousal/Alertness: Awake/alert Overall Cognitive Status: Appears within functional limits for tasks assessed  Orientation Level: Oriented X4 Sensation/Coordination Coordination Gross Motor Movements are Fluid and Coordinated: Yes Fine Motor Movements are Fluid and Coordinated: Yes Extremity Assessment RUE Assessment RUE Assessment: Within Functional Limits LUE Assessment LUE Assessment: Within Functional Limits Mobility  Bed Mobility Bed Mobility: Yes Supine to Sit: 5: Supervision;With rails Transfers Transfers: Yes Sit to Stand: Other (comment);With upper extremity assist;From bed (min guard (A)) Stand to Sit: Other (comment);With upper extremity assist;To chair/3-in-1;With armrests (min guard(A)) Exercises   End of Session OT - End of Session Equipment Utilized During Treatment: Back brace Activity Tolerance: Patient tolerated treatment well Patient left: in chair;with call bell in reach Nurse Communication: Mobility status for transfers;Mobility status for ambulation General Behavior During Session: [facility] for tasks performed Cognition: [facility] for tasks performed   Lucile Shutters 03/21/2012, 3:14 PM  Pager: [phone]

## 2012-03-21 NOTE — Progress Notes (Signed)
Patient ID: [name], female   DOB: [birth year], 78 y.o.   MRN: [id number] Subjective: Patient reports approp back pain, no leg pain, no N/T/W  Objective: Vital signs in last 24 hours: Temp:  [97.5 F (36.4 C)-99 F (37.2 C)] 97.8 F (36.6 C) (04/12 0544) Pulse Rate:  [67-109] 98  (04/12 0544) Resp:  [11-18] 18  (04/12 0544) BP: (91-146)/(53-75) 91/58 mmHg (04/12 0544) SpO2:  [94 %-98 %] 96 % (04/12 0544)  Intake/Output from previous day: 04/11 0701 - 04/12 0700 In: 3550 [P.O.:50; I.V.:3000; IV Piggyback:500] Out: 3085 [Urine:2470; Drains:315; Blood:300] Intake/Output this shift:    Neurologic: Grossly normal Good DF/PF  Lab Results: Lab Results  Component Value Date   WBC 8.6 03/14/2012   HGB 12.3 03/14/2012   HCT 38.0 03/14/2012   MCV 90.3 03/14/2012   PLT 345 03/14/2012   Lab Results  Component Value Date   INR 1.06 03/14/2012   BMET Lab Results  Component Value Date   NA 141 03/14/2012   K 4.1 03/14/2012   CL 107 03/14/2012   CO2 23 03/14/2012   GLUCOSE 118* 03/14/2012   BUN 11 03/14/2012   CREATININE 0.69 03/14/2012   CALCIUM 9.5 03/14/2012    Studies/Results: Dg Lumbar Spine 2-3 Views  03/20/2012  *RADIOLOGY REPORT*  Clinical Data: Fusion L4-5.  LUMBAR SPINE - 2-3 VIEW  Comparison: 02/20/2012 MR.  Findings: Two intraoperative C-arm views of the lumbar spine submitted for review after surgery.  This reveals fusion from L2- S1.  The L4-5 fusion is new from prior MR.  Interbody spacer L2-3, L4-5 and L5-S1.  Minimal anterior slip of L4.  No complication noted on this limited exam.  IMPRESSION: Fusion L2-S1.  Original Report Authenticated By: Fuller Canada, M.D.   Dg C-arm 47-120 Min  03/20/2012  *RADIOLOGY REPORT*  Clinical Data: L4-L5 fusion with hardware removal.  C-ARM 61-120 MIN  Comparison: None.  Findings: Intraoperative fluoroscopic spot films in the AP and lateral projection demonstrate posterior lumbar interbody fusion with rod and pedicle screws extending from S1 to  L2.  IMPRESSION: L2-S1 PLIF.  Original Report Authenticated By: Andreas Newport, M.D.    Assessment/Plan: Doing well, will likely be slow to mobilize.    LOS: 1 day    JONES,DAVID S 03/21/2012, 7:43 AM

## 2012-03-21 NOTE — Progress Notes (Signed)
UR COMPLETED  

## 2012-03-21 NOTE — Evaluation (Signed)
Physical Therapy Evaluation Patient Details Name: [name] MRN: [id number] DOB: Jul 04, 1931 Today's Date: 03/21/2012  Problem List: There is no problem list on file for this patient.   Past Medical History:  Past Medical History  Diagnosis Date  . Hypothyroidism   . Hypertension     dr t turner  . Shortness of breath   . H/O hiatal hernia   . Arthritis   . Anxiety   . Neuropathy   . Tremor   . Complication of anesthesia     nigthmares, hallucinations    Past Surgical History:  Past Surgical History  Procedure Date  . Abdominal hysterectomy   . Back surgery     3 back surgeries,   lumbar fusion  . Wrist fracture surgery   . Eye surgery     cateracts  . Falls     variious fall, broken wrist,and toes  . Cardiac catheterization     clean,   7 yrs ago    PT Assessment/Plan/Recommendation PT Assessment Clinical Impression Statement: Pt presents with a medical diagnosis of TLIF along with the following deficits/impairments and therapy diagnosis listed below. Pt will benefit from skilled PT in the acute care setting in order to maximize functional mobility for a safe d/c home PT Recommendation/Assessment: Patient will need skilled PT in the acute care venue PT Problem List: Decreased strength;Decreased activity tolerance;Decreased mobility;Decreased knowledge of use of DME;Decreased knowledge of precautions;Pain PT Therapy Diagnosis : Abnormality of gait;Acute pain PT Plan PT Frequency: Min 5X/week PT Recommendation Follow Up Recommendations: Home health PT Equipment Recommended: None recommended by OT;Other (comment) (TBD(may need RW)) PT Goals  Acute Rehab PT Goals PT Goal Formulation: With patient Time For Goal Achievement: 7 days Pt will Roll Supine to Right Side: with modified independence PT Goal: Rolling Supine to Right Side - Progress: Goal set today Pt will Roll Supine to Left Side: with modified independence PT Goal: Rolling Supine to Left Side -  Progress: Goal set today Pt will go Supine/Side to Sit: with modified independence PT Goal: Supine/Side to Sit - Progress: Goal set today Pt will go Sit to Supine/Side: with modified independence PT Goal: Sit to Supine/Side - Progress: Goal set today Pt will go Sit to Stand: with modified independence PT Goal: Sit to Stand - Progress: Goal set today Pt will go Stand to Sit: with modified independence PT Goal: Stand to Sit - Progress: Goal set today Pt will Transfer Bed to Chair/Chair to Bed: with modified independence PT Transfer Goal: Bed to Chair/Chair to Bed - Progress: Goal set today Pt will Ambulate: >150 feet;with supervision;with least restrictive assistive device PT Goal: Ambulate - Progress: Goal set today  PT Evaluation Precautions/Restrictions  Precautions Precautions: Back Precaution Booklet Issued: Yes (comment) Precaution Comments: back handout Required Braces or Orthoses: Spinal Brace Spinal Brace: Lumbar corset;Applied in sitting position Restrictions Weight Bearing Restrictions: No Prior Functioning  Home Living Lives With: Spouse Available Help at Discharge: Family;Available 24 hours/day Type of Home: Apartment Home Access: Level entry Home Layout: One level Bathroom Shower/Tub: Walk-in shower;Door [employer] Toilet: Handicapped height Bathroom Accessibility: Yes How Accessible: Accessible via walker Home Adaptive Equipment: Reacher;Long-handled shoehorn;Long-handled sponge;Hand-held shower hose;Bedside commode/3-in-1;Sock aid;Straight cane;Walker - rolling Prior Function Level of Independence: Independent Vocation: Retired Financial risk analyst Arousal/Alertness: Awake/alert Overall Cognitive Status: Appears within functional limits for tasks assessed Orientation Level: Oriented X4 Sensation/Coordination Sensation Light Touch: Appears Intact Coordination Gross Motor Movements are Fluid and Coordinated: Yes Fine Motor Movements are Fluid and Coordinated:  Yes  Extremity Assessment RUE Assessment RUE Assessment: Within Functional Limits LUE Assessment LUE Assessment: Within Functional Limits RLE Assessment RLE Assessment: Within Functional Limits (< LLE but MMT WFL) LLE Assessment LLE Assessment: Within Functional Limits Mobility (including Balance) Bed Mobility Bed Mobility: Yes Supine to Sit: 5: Supervision;With rails Sit to Supine: 4: Min assist;With rail;HOB flat Sit to Supine - Details (indicate cue type and reason): Min assist with LEs into bed. Pt able to roll without any assistance Transfers Transfers: Yes Sit to Stand: Other (comment);With upper extremity assist;From bed;From chair/3-in-1 (min guard (A)) Sit to Stand Details (indicate cue type and reason): VC for hand placement for safety upon standing Stand to Sit: Other (comment);With upper extremity assist;With armrests;To bed;To chair/3-in-1 (min guard(A)) Stand to Sit Details: VC for hand placement. Pt able to control descent Ambulation/Gait Ambulation/Gait: Yes Ambulation/Gait Assistance: 5: Supervision Ambulation/Gait Assistance Details (indicate cue type and reason): VC for proper sequencing and back precautions during turning as well as postural cues throughout (pt required 2-3 breaks throughout secondary to fatigue) Ambulation Distance (Feet): 50 Feet Assistive device: Rolling walker Gait Pattern: Step-to pattern;Decreased stride length;Decreased hip/knee flexion - right;Decreased hip/knee flexion - left;Trunk flexed Gait velocity: decreased gait speed Stairs: No    Exercise    End of Session PT - End of Session Equipment Utilized During Treatment: Gait belt;Back brace Activity Tolerance: Patient tolerated treatment well Patient left: in bed;with call bell in reach;with family/visitor present Nurse Communication: Mobility status for transfers;Mobility status for ambulation General Behavior During Session: [facility] for tasks performed Cognition: [facility] for tasks  performed  [name] 03/21/2012, 3:56 PM  03/21/2012 [name] DPT PAGER: [phone] OFFICE: [phone]

## 2012-03-21 NOTE — Progress Notes (Signed)
Foley d/c'd; 10cc's of saline removed from balloon, 300 cc's of urine discarded. Pt tolerated well

## 2012-03-22 MED ORDER — BISACODYL 10 MG RE SUPP
10.0000 mg | Freq: Every day | RECTAL | Status: DC | PRN
  Administered 2012-03-22: 10 mg via RECTAL
  Filled 2012-03-22: qty 1

## 2012-03-22 NOTE — Progress Notes (Signed)
Occupational Therapy Treatment Patient Details Name: [name] MRN: [id number] DOB: [birth year] Today's Date: 03/22/2012  OT Assessment/Plan OT Assessment/Plan Comments on Treatment Session: all education completed. Pt'husband indep with AE, DME and back precautions. OT Plan: Discharge plan remains appropriate Follow Up Recommendations: No OT follow up Equipment Recommended: None recommended by PT;None recommended by OT OT Goals Acute Rehab OT Goals OT Goal Formulation: With patient Time For Goal Achievement: 2 weeks ADL Goals Pt Will Perform Lower Body Dressing: with modified independence;Sit to stand from chair;Sit to stand from bed ADL Goal: Lower Body Dressing - Progress: Progressing toward goals Pt Will Transfer to Toilet: with modified independence;3-in-1 ADL Goal: Toilet Transfer - Progress: Progressing toward goals Pt Will Perform Toileting - Clothing Manipulation: with modified independence;Sitting on 3-in-1 or toilet ADL Goal: Toileting - Clothing Manipulation - Progress: Progressing toward goals Pt Will Perform Toileting - Hygiene: with modified independence;Sit to stand from 3-in-1/toilet ADL Goal: Toileting - Hygiene - Progress: Progressing toward goals Miscellaneous OT Goals Miscellaneous OT Goal #1: pT WILL DON/ DOFF BRACE MOD I AT EOB AS PRECURSOR TO ADLS OT Goal: Miscellaneous Goal #1 - Progress: Progressing toward goals Miscellaneous OT Goal #2: Pt will verbalize back precautions 3 out 3 as precursor to adls OT Goal: Miscellaneous Goal #2 - Progress: Met  OT Treatment Precautions/Restrictions  Precautions Precautions: Back Precaution Booklet Issued: Yes (comment) Precaution Comments: Pt able to verbalize 3/3 back precautions Required Braces or Orthoses: Spinal Brace Spinal Brace: Lumbar corset;Applied in sitting position Restrictions Weight Bearing Restrictions: No   ADL ADL Grooming: Performed;Supervision/safety Where Assessed - Grooming: Standing  at sink Lower Body Bathing: Simulated;Minimal assistance;Other (comment) (with use of AE) Lower Body Dressing: Simulated;Set up;Supervision/safety;Other (comment) (with AE) Equipment Used: Long-handled sponge;Rolling walker;Reacher;Sock aid Ambulation Related to ADLs: supervission ADL Comments: Husband present and will assist wife as needed. Mobility  Bed Mobility Bed Mobility: No Transfers Transfers: Yes Sit to Stand: 5: Supervision;With upper extremity assist;From chair/3-in-1 Sit to Stand Details (indicate cue type and reason): VC for hand placement and sequencing to maintain back precautions Stand to Sit: 5: Supervision;To chair/3-in-1;With upper extremity assist;With armrests Stand to Sit Details: VC for hand placement. Pt able to control descent Exercises    End of Session OT - End of Session Equipment Utilized During Treatment: Back brace;Gait belt Activity Tolerance: Patient tolerated treatment well Patient left: in chair;with call bell in reach Nurse Communication: Mobility status for transfers;Mobility status for ambulation General Behavior During Session: [facility] for tasks performed Cognition: [facility] for tasks performed  [facility]  03/22/2012, 6:41 PM [facility], OTR/L  [phone] 03/22/2012

## 2012-03-22 NOTE — Progress Notes (Signed)
Patient ID: [name], female   DOB: Jul 05, 1931, 78 y.o.   MRN: [id number] Subjective:  The patient is alert and pleasant. Her back is appropriately sore. She looks well. She complains of some constipation.  Objective: Vital signs in last 24 hours: Temp:  [97.6 F (36.4 C)-99.2 F (37.3 C)] 98.1 F (36.7 C) (04/13 0600) Pulse Rate:  [88-103] 88  (04/13 0600) Resp:  [18-19] 18  (04/13 0600) BP: (96-116)/(50-70) 99/61 mmHg (04/13 0600) SpO2:  [94 %-99 %] 95 % (04/13 0600) Weight:  [75.297 kg (166 lb)] 75.297 kg (166 lb) (04/13 0500)  Intake/Output from previous day: 04/12 0701 - 04/13 0700 In: 650 [P.O.:240] Out: 320 [Urine:200; Drains:120] Intake/Output this shift:    Physical exam the patient is alert and oriented. She is moving her lower extremities well.  Lab Results:  Basename 03/21/12 0530  WBC 10.7*  HGB 9.2*  HCT 27.5*  PLT 317   BMET No results found for this basename: NA:2,K:2,CL:2,CO2:2,GLUCOSE:2,BUN:2,CREATININE:2,CALCIUM:2 in the last 72 hours  Studies/Results: Dg Lumbar Spine 2-3 Views  03/20/2012  *RADIOLOGY REPORT*  Clinical Data: Fusion L4-5.  LUMBAR SPINE - 2-3 VIEW  Comparison: 02/20/2012 MR.  Findings: Two intraoperative C-arm views of the lumbar spine submitted for review after surgery.  This reveals fusion from L2- S1.  The L4-5 fusion is new from prior MR.  Interbody spacer L2-3, L4-5 and L5-S1.  Minimal anterior slip of L4.  No complication noted on this limited exam.  IMPRESSION: Fusion L2-S1.  Original Report Authenticated By: Fuller Canada, M.D.   Dg C-arm 27-120 Min  03/20/2012  *RADIOLOGY REPORT*  Clinical Data: L4-L5 fusion with hardware removal.  C-ARM 61-120 MIN  Comparison: None.  Findings: Intraoperative fluoroscopic spot films in the AP and lateral projection demonstrate posterior lumbar interbody fusion with rod and pedicle screws extending from S1 to L2.  IMPRESSION: L2-S1 PLIF.  Original Report Authenticated By: Andreas Newport,  M.D.    Assessment/Plan: Postop day #2: We will remove her Hemovac drain. I'll add a Dulcolax suppository when necessary. We will continue to mobilize her with PT and OT.  LOS: 2 days     Abdulmalik Darco D 03/22/2012, 9:47 AM

## 2012-03-22 NOTE — Progress Notes (Signed)
Physical Therapy Treatment Patient Details Name: [name] MRN: [id number] DOB: 11-24-1931 Today's Date: 03/22/2012  PT Assessment/Plan  PT - Assessment/Plan Comments on Treatment Session: Good progress with increased ambulation distance today and less assitance needed with mobility today. PT Plan: Discharge plan remains appropriate;Frequency remains appropriate PT Frequency: Min 5X/week Follow Up Recommendations: Home health PT Equipment Recommended: None recommended by OT;Other (comment) (TBD- may need RW) PT Goals  Acute Rehab PT Goals PT Goal: Rolling Supine to Right Side - Progress: Progressing toward goal PT Goal: Supine/Side to Sit - Progress: Progressing toward goal PT Goal: Sit to Stand - Progress: Progressing toward goal PT Goal: Stand to Sit - Progress: Progressing toward goal PT Goal: Ambulate - Progress: Progressing toward goal  PT Treatment Precautions/Restrictions  Precautions Precautions: Back Precaution Booklet Issued: Yes (comment) Precaution Comments: pt able to recall 2/3 back precautions without assistance, re-educated pt on all 3 back precautions. Required Braces or Orthoses: Spinal Brace Spinal Brace: Lumbar corset;Applied in sitting position (min assist to don brace at edge of bed) Restrictions Weight Bearing Restrictions: No Mobility (including Balance) Bed Mobility Rolling Right: 4: Min assist;With rail Rolling Right Details (indicate cue type and reason): cues for logroll technique to follow back precautions Right Sidelying to Sit: 4: Min assist;With rails;HOB elevated (comment degrees) (bed elevated approx 30 degrees) Right Sidelying to Sit Details (indicate cue type and reason): Cues for technique to follow back precautions. Assist needed to elevate trunk into sitting position. Transfers Sit to Stand: Other (comment);From bed;With upper extremity assist (min guard assist.) Sit to Stand Details (indicate cue type and reason): VC for hand  placement with standing for safety. Stand to Sit: 5: Supervision;To chair/3-in-1;With upper extremity assist;With armrests Stand to Sit Details: VC for hand placement to control descent into chair with sitting down. Ambulation/Gait Ambulation/Gait: Yes Ambulation/Gait Assistance: Other (comment) (min guard assist) Ambulation/Gait Assistance Details (indicate cue type and reason): cues for walker placement with gait (to not get too close to front of walker), cues to increase bil step lenght and for upright posture. No rest breaks needed today with gait. Ambulation Distance (Feet): 150 Feet  Assistive device: Rolling walker Gait Pattern: Step-through pattern;Decreased stride length Gait velocity: decreased  Posture/Postural Control Posture/Postural Control: No significant limitations   End of Session PT - End of Session Equipment Utilized During Treatment: Gait belt;Back brace Activity Tolerance: Patient tolerated treatment well Patient left: in chair;with call bell in reach;with family/visitor present Nurse Communication: Mobility status for transfers;Mobility status for ambulation General Behavior During Session: [facility] for tasks performed Cognition: [facility] for tasks performed  Sallyanne Kuster 03/22/2012, 11:57 AM  Sallyanne Kuster, PTA Office- [phone] Pager- [phone]

## 2012-03-22 NOTE — Progress Notes (Signed)
Physical Therapy Treatment Patient Details Name: [name] MRN: [id number] DOB: 1931-01-22 Today's Date: 03/22/2012  PT Assessment/Plan  PT - Assessment/Plan Comments on Treatment Session: Pt ambulated with her RW for home(adjusted to proper height). Pt ambulating and performing all mobility at a supervision level. Will continue per plan. PT Plan: Discharge plan remains appropriate;Frequency remains appropriate PT Frequency: Min 5X/week Follow Up Recommendations: Home health PT Equipment Recommended: None recommended by PT;None recommended by OT PT Goals  Acute Rehab PT Goals PT Goal Formulation: With patient PT Goal: Sit to Stand - Progress: Progressing toward goal PT Goal: Stand to Sit - Progress: Progressing toward goal PT Transfer Goal: Bed to Chair/Chair to Bed - Progress: Progressing toward goal PT Goal: Ambulate - Progress: Progressing toward goal  PT Treatment Precautions/Restrictions  Precautions Precautions: Back Precaution Booklet Issued: Yes (comment) Precaution Comments: Pt able to verbalize 3/3 back precautions Required Braces or Orthoses: Spinal Brace Spinal Brace: Lumbar corset;Applied in sitting position Restrictions Weight Bearing Restrictions: No Mobility (including Balance) Bed Mobility Bed Mobility: No Transfers Transfers: Yes Sit to Stand: 5: Supervision;With upper extremity assist;From chair/3-in-1 Sit to Stand Details (indicate cue type and reason): VC for hand placement and sequencing to maintain back precautions Stand to Sit: 5: Supervision;To chair/3-in-1;With upper extremity assist;With armrests Stand to Sit Details: VC for hand placement. Pt able to control descent Ambulation/Gait Ambulation/Gait: Yes Ambulation/Gait Assistance: 5: Supervision Ambulation/Gait Assistance Details (indicate cue type and reason): VC for safe distance to RW and postural cues throughout. Ambulation Distance (Feet): 75 Feet Assistive device: Rolling  walker Gait Pattern: Step-through pattern;Decreased stride length Gait velocity: decreased Stairs: No    Exercise    End of Session PT - End of Session Equipment Utilized During Treatment: Gait belt;Back brace Activity Tolerance: Patient tolerated treatment well Patient left: in chair;with call bell in reach;with family/visitor present Nurse Communication: Mobility status for transfers;Mobility status for ambulation General Behavior During Session: [facility] for tasks performed Cognition: [facility] for tasks performed  Milana Kidney 03/22/2012, 5:58 PM  03/22/2012 Milana Kidney DPT PAGER: [phone] OFFICE: [phone]

## 2012-03-23 MED ORDER — FLEET ENEMA 7-19 GM/118ML RE ENEM
1.0000 | ENEMA | Freq: Every day | RECTAL | Status: DC | PRN
  Administered 2012-03-24: 1 via RECTAL
  Filled 2012-03-23 (×2): qty 1

## 2012-03-23 MED ORDER — POLYETHYLENE GLYCOL 3350 17 G PO PACK
17.0000 g | PACK | Freq: Every day | ORAL | Status: DC
  Administered 2012-03-23 – 2012-03-26 (×4): 17 g via ORAL
  Filled 2012-03-23 (×5): qty 1

## 2012-03-23 NOTE — Progress Notes (Signed)
Patient ID: [name], female   DOB: 09/29/1931, 78 y.o.   MRN: [id number] Subjective:  The patient is alert and pleasant. She continues to complain of constipation. I spoke with her husband.  Objective: Vital signs in last 24 hours: Temp:  [97.8 F (36.6 C)-99.6 F (37.6 C)] 99.6 F (37.6 C) (04/14 0624) Pulse Rate:  [80-107] 80  (04/14 0624) Resp:  [18] 18  (04/14 0624) BP: (91-126)/(56-74) 101/63 mmHg (04/14 0624) SpO2:  [94 %-96 %] 94 % (04/14 0624)  Intake/Output from previous day: 04/13 0701 - 04/14 0700 In: 960 [P.O.:960] Out: -  Intake/Output this shift:    Physical exam the patient is alert and oriented. Her lower extremity strength is grossly normal.  Lab Results:  Basename 03/21/12 0530  WBC 10.7*  HGB 9.2*  HCT 27.5*  PLT 317   BMET No results found for this basename: NA:2,K:2,CL:2,CO2:2,GLUCOSE:2,BUN:2,CREATININE:2,CALCIUM:2 in the last 72 hours  Studies/Results: No results found.  Assessment/Plan: Postop day #3: The patient is doing well neurologically. We will continue to mobilize her with PT and OT.  Constipation: She says MiraLAX works well with her how add this as well as when necessary fleets enema.  LOS: 3 days     Rhyan Wolters D 03/23/2012, 9:50 AM

## 2012-03-23 NOTE — Progress Notes (Signed)
Physical Therapy Treatment Patient Details Name: [name] MRN: [id number] DOB: 06-Jan-1931 Today's Date: 03/23/2012  PT Assessment/Plan  PT - Assessment/Plan Comments on Treatment Session: Pt with increased stiffness this session requiring extra time to  complete mobility although did not require increased physical assist. Continue per plan. Plan to d/c Monday or Tuesday PT Plan: Discharge plan remains appropriate;Frequency remains appropriate PT Frequency: Min 5X/week Follow Up Recommendations: Home health PT Equipment Recommended: None recommended by PT;None recommended by OT PT Goals  Acute Rehab PT Goals PT Goal Formulation: With patient PT Goal: Rolling Supine to Right Side - Progress: Progressing toward goal PT Goal: Rolling Supine to Left Side - Progress: Progressing toward goal PT Goal: Supine/Side to Sit - Progress: Progressing toward goal PT Goal: Sit to Supine/Side - Progress: Progressing toward goal PT Goal: Sit to Stand - Progress: Progressing toward goal PT Goal: Stand to Sit - Progress: Progressing toward goal PT Transfer Goal: Bed to Chair/Chair to Bed - Progress: Progressing toward goal PT Goal: Ambulate - Progress: Progressing toward goal  PT Treatment Precautions/Restrictions  Precautions Precautions: Back Precaution Booklet Issued: Yes (comment) Precaution Comments: pt able to recall 3/3 back precautions. VC throughout treatment to avoid twisting Required Braces or Orthoses: Spinal Brace Spinal Brace: Lumbar corset;Applied in sitting position Restrictions Weight Bearing Restrictions: No Mobility (including Balance) Bed Mobility Bed Mobility: Yes Rolling Left: 5: Supervision Rolling Left Details (indicate cue type and reason): VC for sequencing to ease transfer.  Left Sidelying to Sit: 4: Min assist;With rails;HOB elevated (comment degrees) Left Sidelying to Sit Details (indicate cue type and reason): VC for sequencing to maintain back precautions.  Min assist through trunk and pelvis to assist into sitting positions. Sit to Supine: 4: Min assist;HOB elevated (comment degrees);With rail Sit to Supine - Details (indicate cue type and reason): Min assist with LEs. VC for sequencing to maintain back precautions Transfers Transfers: Yes Sit to Stand: 5: Supervision;With upper extremity assist;From chair/3-in-1 Sit to Stand Details (indicate cue type and reason): VC for hand placement. Stand to Sit: 5: Supervision;To chair/3-in-1;With upper extremity assist;With armrests Stand to Sit Details: VC for hand placement Ambulation/Gait Ambulation/Gait: Yes Ambulation/Gait Assistance: 5: Supervision Ambulation/Gait Assistance Details (indicate cue type and reason): VC for proper distance to RW. Pt took multiple rest breaks throughout. Gait at extremely slow pace this session as well as smaller step lengths than previous session.  Ambulation Distance (Feet): 100 Feet Assistive device: Rolling walker Gait Pattern: Step-through pattern;Decreased stride length Gait velocity: decreased Stairs: No    Exercise    End of Session PT - End of Session Equipment Utilized During Treatment: Gait belt;Back brace Activity Tolerance: Patient tolerated treatment well Patient left: in chair;with call bell in reach;with family/visitor present Nurse Communication: Mobility status for transfers;Mobility status for ambulation General Behavior During Session: [facility] for tasks performed Cognition: [facility] for tasks performed  Milana Kidney 03/23/2012, 1:41 PM  03/23/2012 Milana Kidney DPT PAGER: [phone] OFFICE: [phone]

## 2012-03-24 MED FILL — Sodium Chloride Irrigation Soln 0.9%: Qty: 3000 | Status: AC

## 2012-03-24 MED FILL — Heparin Sodium (Porcine) Inj 1000 Unit/ML: INTRAMUSCULAR | Qty: 30 | Status: AC

## 2012-03-24 MED FILL — Sodium Chloride IV Soln 0.9%: INTRAVENOUS | Qty: 1000 | Status: AC

## 2012-03-24 NOTE — Progress Notes (Signed)
PT Cancellation Note  Treatment cancelled today due to patient's refusal to participate. Pt has been up for a couple hours and just got back into bed, deferred therapy today. Will attempt therapy tomorrow prior to d/c  Thanks. 03/24/2012 Milana Kidney DPT PAGER: [phone] OFFICE: [phone]    Milana Kidney 03/24/2012, 2:48 PM

## 2012-03-24 NOTE — Progress Notes (Signed)
Clinical Social Work Department BRIEF PSYCHOSOCIAL ASSESSMENT 03/24/2012  Patient:  [name], [name]     Account Number:  [id number]     Admit date:  03/20/2012  Clinical Social Worker:  Peggyann Shoals  Date/Time:  03/24/2012 04:30 PM  Referred by:  Care Management  Date Referred:  03/24/2012 Referred for  SNF Placement   Other Referral:   Interview type:  Family Other interview type:    PSYCHOSOCIAL DATA Living Status:  HUSBAND Admitted from facility:   Level of care:   Primary support name:  [name] Primary support relationship to patient:  SPOUSE Degree of support available:   very supportive. Pt also has a very supportive daughter, [name].    CURRENT CONCERNS Current Concerns  Post-Acute Placement   Other Concerns:    SOCIAL WORK ASSESSMENT / PLAN CSW met with pt to address consult. Currently, PT is recommending home health PT, however family feels that pt would benifit from SNF for ST-rehab. Pt is agreeable.   Assessment/plan status:  Other - See comment Other assessment/ plan:   CSW spoke with [facility]. Pt is a resident of the independent living facility. CSW will send referral and follow up with pt and  family regarding discharge plan.   Information/referral to community resources:   As needed.    PATIENT'S/FAMILY'S RESPONSE TO PLAN OF CARE: Pt was very pleasant and agreeable to discharge plan.     Dede Query, MSW, Theresia Majors [phone]

## 2012-03-24 NOTE — Progress Notes (Signed)
Patient ID: [name], female   DOB: 05-Jan-1931, 78 y.o.   MRN: [id number] Subjective: Patient reports back soreness, right lower extremity radicular pain she had before surgery is gone. Slowly mobilizing.  Objective: Vital signs in last 24 hours: Temp:  [97.4 F (36.3 C)-99 F (37.2 C)] 98.3 F (36.8 C) (04/15 0635) Pulse Rate:  [77-96] 79  (04/15 0635) Resp:  [17-20] 20  (04/15 0635) BP: (94-115)/(52-70) 105/68 mmHg (04/15 0635) SpO2:  [91 %-96 %] 91 % (04/15 0635)  Intake/Output from previous day:   Intake/Output this shift:    Neurologic: Grossly normal  Lab Results: Lab Results  Component Value Date   WBC 10.7* 03/21/2012   HGB 9.2* 03/21/2012   HCT 27.5* 03/21/2012   MCV 89.0 03/21/2012   PLT 317 03/21/2012   Lab Results  Component Value Date   INR 1.06 03/14/2012   BMET Lab Results  Component Value Date   NA 141 03/14/2012   K 4.1 03/14/2012   CL 107 03/14/2012   CO2 23 03/14/2012   GLUCOSE 118* 03/14/2012   BUN 11 03/14/2012   CREATININE 0.69 03/14/2012   CALCIUM 9.5 03/14/2012    Studies/Results: No results found.  Assessment/Plan: Doing fairly well other than constipation. Continue PTOT. Plan discharge tomorrow.   LOS: 4 days    Etosha Wetherell S 03/24/2012, 8:53 AM

## 2012-03-25 ENCOUNTER — Inpatient Hospital Stay (HOSPITAL_COMMUNITY): Payer: Medicare Other

## 2012-03-25 NOTE — Progress Notes (Signed)
Physical Therapy Treatment Patient Details Name: [name] MRN: [id number] DOB: 03-08-1931 Today's Date: 03/25/2012  PT Assessment/Plan  PT - Assessment/Plan Comments on Treatment Session: Pt increased activity despite 10/10 pain rating in lower back.  Pt had no LOB or unsteadiness during amb. Pt was able to increase amb distance without breaks. Pt needs to be assesed on stairs and bed mobility to increase safety before d/c.  PT Plan: Discharge plan remains appropriate;Frequency remains appropriate PT Frequency: Min 5X/week Follow Up Recommendations: Home health PT Equipment Recommended: None recommended by PT;None recommended by OT PT Goals  Acute Rehab PT Goals PT Goal: Stand to Sit - Progress: Progressing toward goal PT Goal: Ambulate - Progress: Progressing toward goal  PT Treatment Precautions/Restrictions  Precautions Precautions: Back;Fall Precaution Booklet Issued: Yes (comment) (Pt was instructed and given back precaustions handout ) Precaution Comments: Pt was educated on 3/3 precaustions Required Braces or Orthoses: Spinal Brace Spinal Brace: Lumbar corset;Applied in sitting position (pt stated she needs assistance putting brace on ) Restrictions Weight Bearing Restrictions: No Mobility (including Balance) Bed Mobility Bed Mobility: No Transfers Transfers: Yes Sit to Stand: 4: Min assist;With upper extremity assist;From chair/3-in-1 Sit to Stand Details (indicate cue type and reason): Pt required min assist to control body posture when adjusting hand placement on RW during ascent   Stand to Sit: To chair/3-in-1;5: Supervision;With upper extremity assist;With armrests Ambulation/Gait Ambulation/Gait: Yes Ambulation/Gait Assistance: 4: Min assist (minguard A for safety ) Ambulation/Gait Assistance Details (indicate cue type and reason): Pt had no LOB or unsteadiness during amb. Pt was able to increase amb distance without breaks. Pt was able to progress to step  through pattern with VC's  Ambulation Distance (Feet): 275 Feet Assistive device: Rolling walker Gait Pattern: Step-through pattern Stairs: No Wheelchair Mobility Wheelchair Mobility: No  End of Session PT - End of Session Equipment Utilized During Treatment: Gait belt;Back brace Activity Tolerance: Patient tolerated treatment well Patient left: in chair;with call bell in reach Nurse Communication: Mobility status for transfers;Mobility status for ambulation General Behavior During Session: [facility] for tasks performed Cognition: [facility] for tasks performed  [name] 03/25/2012, 12:46 PM

## 2012-03-25 NOTE — Progress Notes (Signed)
03/25/2012 Madelene Kaatz Elizabeth PTA [phone] pager [phone] office    

## 2012-03-25 NOTE — Progress Notes (Addendum)
Late Entry  Clinical Social Work Department CLINICAL SOCIAL WORK PLACEMENT NOTE 03/25/2012  Patient:  MELESA, LECY  Account Number:  [id number] Admit date:  03/20/2012  Clinical Social Worker:  Peggyann Shoals  Date/time:  03/24/2012 04:30 PM  Clinical Social Work is seeking post-discharge placement for this patient at the following level of care:   SKILLED NURSING   (*CSW will update this form in Epic as items are completed)   03/24/2012  Patient/family provided with [facility] Department of Clinical Social Work's list of facilities offering this level of care within the geographic area requested by the patient (or if unable, by the patient's family).  03/24/2012  Patient/family informed of their freedom to choose among providers that offer the needed level of care, that participate in Medicare, Medicaid or managed care program needed by the patient, have an available bed and are willing to accept the patient.  03/24/2012  Patient/family informed of [facility]' ownership interest in [facility], as well as of the fact that they are under no obligation to receive care at this facility.  PASARR submitted to [employer] on 03/25/2012 (existing PASARR#) PASARR number received from [employer] on   FL2 transmitted to all facilities in geographic area requested by pt/family on  03/25/2012 FL2 transmitted to all facilities within larger geographic area on   Patient informed that his/her managed care company has contracts with or will negotiate with  certain facilities, including the following:     Patient/family informed of bed offers received:  03/25/12 Patient chooses bed at [facility] Physician recommends and patient chooses bed at  [facility]  Patient to be transferred to [facility] [place] on 03/25/12 Patient to be transferred to facility by pt's husband.   The following physician request were entered in Epic:   Additional Comments: Pt has an  existing PASARR#  Dede Query, MSW, [employer] [phone]

## 2012-03-25 NOTE — Progress Notes (Signed)
Patient ID: ASPEN LAWRANCE, female   DOB: 1931/04/05, 78 y.o.   MRN: [id number] Subjective: Patient reports some L hip pain today. Otherwise no change. Still slow in mobilization.  Objective: Vital signs in last 24 hours: Temp:  [98.2 F (36.8 C)-98.4 F (36.9 C)] 98.2 F (36.8 C) (04/16 0557) Pulse Rate:  [74-79] 74  (04/16 0557) Resp:  [16] 16  (04/16 0557) BP: (95-97)/(59) 97/59 mmHg (04/16 0557) SpO2:  [96 %-98 %] 96 % (04/16 0557)  Intake/Output from previous day:   Intake/Output this shift:    Neurologic: Grossly normal  Lab Results: Lab Results  Component Value Date   WBC 10.7* 03/21/2012   HGB 9.2* 03/21/2012   HCT 27.5* 03/21/2012   MCV 89.0 03/21/2012   PLT 317 03/21/2012   Lab Results  Component Value Date   INR 1.06 03/14/2012   BMET Lab Results  Component Value Date   NA 141 03/14/2012   K 4.1 03/14/2012   CL 107 03/14/2012   CO2 23 03/14/2012   GLUCOSE 118* 03/14/2012   BUN 11 03/14/2012   CREATININE 0.69 03/14/2012   CALCIUM 9.5 03/14/2012    Studies/Results: No results found.  Assessment/Plan: Cont PT/OT. To rehab section of [facility] tomorrow. Xrays today.   LOS: 5 days    Kian Ottaviano S 03/25/2012, 8:26 AM

## 2012-03-26 DIAGNOSIS — E785 Hyperlipidemia, unspecified: Secondary | ICD-10-CM | POA: Diagnosis not present

## 2012-03-26 DIAGNOSIS — R269 Unspecified abnormalities of gait and mobility: Secondary | ICD-10-CM | POA: Diagnosis not present

## 2012-03-26 DIAGNOSIS — D649 Anemia, unspecified: Secondary | ICD-10-CM | POA: Diagnosis not present

## 2012-03-26 DIAGNOSIS — K449 Diaphragmatic hernia without obstruction or gangrene: Secondary | ICD-10-CM | POA: Diagnosis not present

## 2012-03-26 DIAGNOSIS — M48061 Spinal stenosis, lumbar region without neurogenic claudication: Secondary | ICD-10-CM | POA: Diagnosis not present

## 2012-03-26 DIAGNOSIS — M6281 Muscle weakness (generalized): Secondary | ICD-10-CM | POA: Diagnosis not present

## 2012-03-26 DIAGNOSIS — Z471 Aftercare following joint replacement surgery: Secondary | ICD-10-CM | POA: Diagnosis not present

## 2012-03-26 DIAGNOSIS — M79609 Pain in unspecified limb: Secondary | ICD-10-CM | POA: Diagnosis not present

## 2012-03-26 DIAGNOSIS — R609 Edema, unspecified: Secondary | ICD-10-CM | POA: Diagnosis not present

## 2012-03-26 DIAGNOSIS — R7989 Other specified abnormal findings of blood chemistry: Secondary | ICD-10-CM | POA: Diagnosis not present

## 2012-03-26 DIAGNOSIS — E039 Hypothyroidism, unspecified: Secondary | ICD-10-CM | POA: Diagnosis not present

## 2012-03-26 DIAGNOSIS — I10 Essential (primary) hypertension: Secondary | ICD-10-CM | POA: Diagnosis not present

## 2012-03-26 DIAGNOSIS — K5900 Constipation, unspecified: Secondary | ICD-10-CM | POA: Diagnosis not present

## 2012-03-26 DIAGNOSIS — M961 Postlaminectomy syndrome, not elsewhere classified: Secondary | ICD-10-CM | POA: Diagnosis not present

## 2012-03-26 DIAGNOSIS — E559 Vitamin D deficiency, unspecified: Secondary | ICD-10-CM | POA: Diagnosis not present

## 2012-03-26 DIAGNOSIS — M810 Age-related osteoporosis without current pathological fracture: Secondary | ICD-10-CM | POA: Diagnosis not present

## 2012-03-26 DIAGNOSIS — R0609 Other forms of dyspnea: Secondary | ICD-10-CM | POA: Diagnosis not present

## 2012-03-26 DIAGNOSIS — G609 Hereditary and idiopathic neuropathy, unspecified: Secondary | ICD-10-CM | POA: Diagnosis not present

## 2012-03-26 MED ORDER — TRAMADOL HCL 50 MG PO TABS: 50.0000 mg | ORAL_TABLET | Freq: Three times a day (TID) | ORAL | Status: AC | PRN

## 2012-03-26 NOTE — Progress Notes (Signed)
Pt is ready for discharge to [facility] SNF. Facility received all needed information and is ready to accept pt. Pt's husband will be providing transportation to facility. Pt and husband are agreeable to discharge plan. Pt understands that she is to give the packet of information with scripts to admissions coordinator upon arrival. CSW is signing off as no further clinical social work needs identified.   Dede Query, MSW, Theresia Majors [phone]

## 2012-03-26 NOTE — Progress Notes (Addendum)
Physical Therapy Treatment Patient Details Name: [name] MRN: [id number] DOB: 12/26/1930 Today's Date: 03/26/2012  PT Assessment/Plan  PT - Assessment/Plan Comments on Treatment Session: Pt increased amb distance and amb stairs without breaks or increased pain. Pt and husband was educated on proper brace placement and back precaustions. Pt had a positive attitude about d/c. PT Plan: Discharge plan remains appropriate;Frequency remains appropriate PT Frequency: Min 5X/week Follow Up Recommendations: Home health PT Equipment Recommended: None recommended by PT PT Goals  Acute Rehab PT Goals PT Goal: Sit to Stand - Progress: Progressing toward goal PT Goal: Stand to Sit - Progress: Progressing toward goal PT Transfer Goal: Bed to Chair/Chair to Bed - Progress: Progressing toward goal PT Goal: Ambulate - Progress: Progressing toward goal  PT Treatment Precautions/Restrictions  Precautions Precautions: Fall;Back Precaution Booklet Issued: Yes (comment) Precaution Comments: Pt and her husband was educated on 3/3 precaustions.  Required Braces or Orthoses: Spinal Brace Spinal Brace: Applied in sitting position;Lumbar corset (pt and husband was able to put on brace) Restrictions Weight Bearing Restrictions: No Mobility (including Balance) Bed Mobility Bed Mobility: No Supine to Sit: 5: Supervision;HOB flat Sit to Supine: 4: Min assist;HOB flat;With rail Sit to Supine - Details (indicate cue type and reason): Min (A) with BIL LE into bed. Pt initiating and performing 85% of  task.  Transfers Transfers: Yes Sit to Stand: From chair/3-in-1;With upper extremity assist;5: Supervision Stand to Sit: 5: Supervision;Without upper extremity assist;To chair/3-in-1 Ambulation/Gait Ambulation/Gait: Yes Ambulation/Gait Assistance: 5: Supervision Ambulation/Gait Assistance Details (indicate cue type and reason): Pt was able to increase amb distance without breaks. Pt required VC's to  progress to step through gait pattern. Ambulation Distance (Feet): 300 Feet Assistive device: Rolling walker Gait Pattern: Step-through pattern Stairs: Yes Stair Management Technique: One rail Left;Forwards (pt needs VC's to correct trunk posture during descent. ) Number of Stairs: 10  (5x2 in gym 3rd floor )  End of Session PT - End of Session Equipment Utilized During Treatment: Gait belt;Back brace Activity Tolerance: Patient tolerated treatment well Patient left: in chair;with call bell in reach;with family/visitor present Nurse Communication: Mobility status for transfers;Mobility status for ambulation;Other (comment) (pt talked with doc in hallway during treatment ) General Behavior During Session: [facility] for tasks performed Cognition: [facility] for tasks performed  [name] 03/26/2012, 11:32 AM

## 2012-03-26 NOTE — Progress Notes (Signed)
Occupational Therapy Treatment Patient Details Name: [name] MRN: [id number] DOB: 06/11/1931 Today's Date: 03/26/2012  OT Assessment/Plan OT Assessment/Plan Comments on Treatment Session: Pt and husband educated on bed mobility and good return demonstration.  OT Plan: Discharge plan remains appropriate OT Frequency: Min 2X/week Follow Up Recommendations: No OT follow up Equipment Recommended: None recommended by OT OT Goals Acute Rehab OT Goals OT Goal Formulation: With patient Time For Goal Achievement: 2 weeks ADL Goals Pt Will Perform Lower Body Dressing: with modified independence;Sit to stand from chair;Sit to stand from bed ADL Goal: Lower Body Dressing - Progress: Progressing toward goals Pt Will Transfer to Toilet: with modified independence;3-in-1 ADL Goal: Toilet Transfer - Progress: Progressing toward goals Pt Will Perform Toileting - Clothing Manipulation: with modified independence;Sitting on 3-in-1 or toilet ADL Goal: Toileting - Clothing Manipulation - Progress: Progressing toward goals Pt Will Perform Toileting - Hygiene: with modified independence;Sit to stand from 3-in-1/toilet ADL Goal: Toileting - Hygiene - Progress: Progressing toward goals Miscellaneous OT Goals Miscellaneous OT Goal #1: pT WILL DON/ DOFF BRACE MOD I AT EOB AS PRECURSOR TO ADLS OT Goal: Miscellaneous Goal #1 - Progress: Progressing toward goals Miscellaneous OT Goal #2: Pt will verbalize back precautions 3 out 3 as precursor to adls OT Goal: Miscellaneous Goal #2 - Progress: Met  OT Treatment Precautions/Restrictions  Precautions Precautions: Back;Fall Precaution Booklet Issued: Yes (comment) Precaution Comments: Pt was educated on 3/3 precaustions Required Braces or Orthoses: Spinal Brace Spinal Brace: Lumbar corset;Applied in sitting position Restrictions Weight Bearing Restrictions: No   ADL ADL Equipment Used: Rolling walker Ambulation Related to ADLs: supervision  ambulation from chair <>bed x2 ADL Comments: Pt and husband report that main deficit is bed mobility. pt performed bed mobility on on Left hand side for first time (pt will use this side at home) Pt educated on hooking BIL LE together to help (A) bring BIL LE onto bed surface. Pt completed returned to chair level then requesting to lie down again. Pt performed bed mobility on Rt hand side of bed second attempt .  Mobility  Bed Mobility Bed Mobility: Yes Supine to Sit: 5: Supervision;HOB flat Sit to Supine: 4: Min assist;HOB flat;With rail Sit to Supine - Details (indicate cue type and reason): Min (A) with BIL LE into bed. Pt initiating and performing 85% of  task.  Transfers Transfers: Yes Sit to Stand: 5: Supervision;With upper extremity assist;From chair/3-in-1 Stand to Sit: 5: Supervision;With upper extremity assist;To chair/3-in-1 Exercises    End of Session OT - End of Session Equipment Utilized During Treatment: Back brace;Gait belt Activity Tolerance: Patient tolerated treatment well Patient left: in chair;with call bell in reach Nurse Communication: Mobility status for transfers;Mobility status for ambulation General Behavior During Session: [facility] for tasks performed Cognition: [facility] for tasks performed  Lucile Shutters  03/26/2012, 10:34 AM Pager: [phone]

## 2012-03-26 NOTE — Progress Notes (Signed)
03/26/2012 Brittainy Bucker Elizabeth PTA [phone] pager [phone] office    

## 2012-03-26 NOTE — Discharge Summary (Signed)
Physician Discharge Summary  Patient ID: [name] MRN: [id number] DOB/AGE: November 23, 1931 78 y.o.  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 The patient tolerated the procedure well and was taken to the recovery room and then to the floor in stable condition. The hospital course was routine. There were no complications. The wound remained clean dry and intact. Pt had appropriate back soreness. No complaints of leg pain or new N/T/W. The patient remained afebrile with stable vital signs, and tolerated a regular diet. The patient continued to increase activities, and pain was well controlled with oral pain medications.   Consults: None  Significant Diagnostic Studies:  Results for orders placed during the hospital encounter of 03/20/12  CBC      Component Value Range   WBC 10.7 (*) 4.0 - 10.5 (K/uL)   RBC 3.09 (*) 3.87 - 5.11 (MIL/uL)   Hemoglobin 9.2 (*) 12.0 - 15.0 (g/dL)   HCT 40.9 (*) 81.1 - 46.0 (%)   MCV 89.0  78.0 - 100.0 (fL)   MCH 29.8  26.0 - 34.0 (pg)   MCHC 33.5  30.0 - 36.0 (g/dL)   RDW 91.4  78.2 - 95.6 (%)   Platelets 317  150 - 400 (K/uL)    Chest 2 View  03/14/2012  *RADIOLOGY REPORT*  Clinical Data: Hypertension, shortness of breath.  CHEST - 2 VIEW  Comparison: Chest x-ray 05/23/2011.  Findings: Lung volumes are lower limits of normal.  No consolidative airspace disease.  No pleural effusions.  Heart size is borderline enlarged (unchanged). The patient is rotated to the right on today's exam, resulting in distortion of the mediastinal contours and reduced diagnostic sensitivity and specificity for mediastinal pathology.  Atherosclerosis of the thoracic aorta. Orthopedic fixation hardware in the lumbar spine  incompletely visualized.  Again noted is approximately 7 mm of retrolisthesis of L1 upon L2 (unchanged compared to prior lumbar spine radiograph dated 03/09/2011).  IMPRESSION: 1.  No radiographic evidence of acute cardiopulmonary disease. 2.  Borderline cardiomegaly (unchanged). 3.  Atherosclerosis. 4.  Postoperative changes incompletely visualized in the lumbar spine, with similar 7 mm of retrolisthesis of L1 upon L2.  Original Report Authenticated By: Florencia Reasons, M.D.   Dg Lumbar Spine 2-3 Views  03/25/2012  *RADIOLOGY REPORT*  Clinical Data: History of lumbar surgery.  Back pain.  LUMBAR SPINE - 2-3 VIEW  Comparison: Intraoperative views of the lumbar spine 03/20/2012 and MRI lumbar spine 02/20/2012.  Findings: As seen on the most recent comparison, the patient is status post L2-S1 laminectomy and fusion.  Hardware is intact. Degenerative disc disease at L1-2 is again seen.  0.6 cm of anterolisthesis of L5 on S1 is unchanged.  There is no fracture or other new abnormality.  Right paraspinous calcification is unchanged.  Right hip replacement is noted.  IMPRESSION: Status post L2-S1 fusion without evidence of complication.  No acute abnormality.  Original Report Authenticated By: Bernadene Bell. D'ALESSIO, M.D.   Dg Lumbar Spine 2-3 Views  03/20/2012  *RADIOLOGY REPORT*  Clinical Data: Fusion L4-5.  LUMBAR SPINE - 2-3 VIEW  Comparison: 02/20/2012 MR.  Findings: Two intraoperative C-arm views of the lumbar spine submitted for review after surgery.  This reveals fusion from L2- S1.  The L4-5 fusion is new from prior MR.  Interbody spacer L2-3, L4-5 and L5-S1.  Minimal anterior slip of L4.  No complication noted on this limited exam.  IMPRESSION: Fusion L2-S1.  Original Report Authenticated By: Fuller Canada, M.D.   Dg C-arm 66-120 Min  03/20/2012  *RADIOLOGY REPORT*  Clinical Data: L4-L5 fusion with hardware removal.  C-ARM 61-120 MIN  Comparison: None.  Findings: Intraoperative fluoroscopic spot films  in the AP and lateral projection demonstrate posterior lumbar interbody fusion with rod and pedicle screws extending from S1 to L2.  IMPRESSION: L2-S1 PLIF.  Original Report Authenticated By: Andreas Newport, M.D.    Antibiotics:  Anti-infectives     Start     Dose/Rate Route Frequency Ordered Stop   03/20/12 2300   ceFAZolin (ANCEF) IVPB 1 g/50 mL premix        1 g 100 mL/hr over 30 Minutes Intravenous Every 8 hours 03/20/12 2217 03/21/12 0932   03/20/12 1902   ceFAZolin (ANCEF) 1-5 GM-% IVPB     Comments: AYDELETTE, JAMIE: cabinet override         03/20/12 1902 03/21/12 0714   03/20/12 1530   bacitracin 50,000 Units in sodium chloride irrigation 0.9 % 500 mL irrigation  Status:  Discontinued          As needed 03/20/12 1559 03/20/12 1914   03/20/12 1431   bacitracin 57846 UNITS injection     Comments: Reece Packer: cabinet override         03/20/12 1431 03/21/12 0244   03/19/12 1421   ceFAZolin (ANCEF) IVPB 1 g/50 mL premix  Status:  Discontinued     Comments: If allergic to Penicillin,change to Vancomycin 500mg  IV to OR      1 g 100 mL/hr over 30 Minutes Intravenous 60 min pre-op 03/19/12 1421 03/20/12 2211          Discharge Exam: Blood pressure 101/65, pulse 83, temperature 98.2 F (36.8 C), temperature source Oral, resp. rate 18, height 5' (1.524 m), weight 75.297 kg (166 lb), SpO2 97.00%. Neurologic: Grossly normal Incision CDI  Discharge Medications:   Medication List  As of 03/26/2012  8:27 AM   TAKE these medications         aspirin EC 81 MG tablet   Take 81 mg by mouth daily.      DEXILANT 60 MG capsule   Generic drug: dexlansoprazole   Take 60 mg by mouth daily.      diltiazem 120 MG 24 hr capsule   Commonly known as: TIAZAC   Take 120 mg by mouth every evening.      docusate sodium 100 MG capsule   Commonly known as: COLACE   Take 100 mg by mouth at bedtime.      ezetimibe 10 MG tablet   Commonly known as: ZETIA   Take 10 mg by mouth every  evening.      levothyroxine 50 MCG tablet   Commonly known as: SYNTHROID, LEVOTHROID   Take 50 mcg by mouth daily.      metoprolol succinate 50 MG 24 hr tablet   Commonly known as: TOPROL-XL   Take 50 mg by mouth every evening.      mulitivitamin with minerals Tabs   Take 1 tablet by mouth daily.      pramipexole 1 MG tablet   Commonly known as: MIRAPEX   Take 1 mg by mouth 4 (four) times daily.      pravastatin 80 MG tablet   Commonly known as: PRAVACHOL   Take 80  mg by mouth every evening.      pregabalin 75 MG capsule   Commonly known as: LYRICA   Take 75 mg by mouth 2 (two) times daily.      traMADol 50 MG tablet   Commonly known as: ULTRAM   Take 1 tablet (50 mg total) by mouth 3 (three) times daily as needed for pain (1-2 tablets as needed for moderate pain).            Disposition: friends home   Final Dx: TLIF  Discharge Orders    Future Orders Please Complete By Expires   Diet - low sodium heart healthy      Increase activity slowly      Lifting restrictions      Comments:   Less than 8 lbs   Other Restrictions      Comments:   No bending or twisting   Remove dressing in 24 hours      Call MD for:  temperature >100.4      Call MD for:  persistant nausea and vomiting      Call MD for:  severe uncontrolled pain      Call MD for:  redness, tenderness, or signs of infection (pain, swelling, redness, odor or green/yellow discharge around incision site)      Call MD for:  difficulty breathing, headache or visual disturbances      Call MD for:  persistant dizziness or light-headedness         Follow-up Information    Follow up with Kervin Bones S, MD. Schedule an appointment as soon as possible for a visit in 2 weeks.   Contact information:   [address] [place] [address] [phone]           Signed: Tia Alert 03/26/2012, 8:27 AM

## 2012-03-31 DIAGNOSIS — I10 Essential (primary) hypertension: Secondary | ICD-10-CM | POA: Diagnosis not present

## 2012-03-31 DIAGNOSIS — D649 Anemia, unspecified: Secondary | ICD-10-CM | POA: Diagnosis not present

## 2012-03-31 DIAGNOSIS — K5900 Constipation, unspecified: Secondary | ICD-10-CM | POA: Diagnosis not present

## 2012-03-31 DIAGNOSIS — R0989 Other specified symptoms and signs involving the circulatory and respiratory systems: Secondary | ICD-10-CM | POA: Diagnosis not present

## 2012-03-31 DIAGNOSIS — R609 Edema, unspecified: Secondary | ICD-10-CM | POA: Diagnosis not present

## 2012-03-31 DIAGNOSIS — R0609 Other forms of dyspnea: Secondary | ICD-10-CM | POA: Diagnosis not present

## 2012-04-02 DIAGNOSIS — G609 Hereditary and idiopathic neuropathy, unspecified: Secondary | ICD-10-CM | POA: Diagnosis not present

## 2012-04-02 DIAGNOSIS — M48061 Spinal stenosis, lumbar region without neurogenic claudication: Secondary | ICD-10-CM | POA: Diagnosis not present

## 2012-04-02 DIAGNOSIS — R609 Edema, unspecified: Secondary | ICD-10-CM | POA: Diagnosis not present

## 2012-04-02 DIAGNOSIS — D649 Anemia, unspecified: Secondary | ICD-10-CM | POA: Diagnosis not present

## 2012-04-02 DIAGNOSIS — M79609 Pain in unspecified limb: Secondary | ICD-10-CM | POA: Diagnosis not present

## 2012-04-15 ENCOUNTER — Other Ambulatory Visit: Payer: Self-pay

## 2012-04-15 DIAGNOSIS — I739 Peripheral vascular disease, unspecified: Secondary | ICD-10-CM

## 2012-04-16 DIAGNOSIS — M48061 Spinal stenosis, lumbar region without neurogenic claudication: Secondary | ICD-10-CM | POA: Diagnosis not present

## 2012-04-16 DIAGNOSIS — R262 Difficulty in walking, not elsewhere classified: Secondary | ICD-10-CM | POA: Diagnosis not present

## 2012-04-16 DIAGNOSIS — M6281 Muscle weakness (generalized): Secondary | ICD-10-CM | POA: Diagnosis not present

## 2012-04-16 DIAGNOSIS — R269 Unspecified abnormalities of gait and mobility: Secondary | ICD-10-CM | POA: Diagnosis not present

## 2012-04-21 DIAGNOSIS — M7150 Other bursitis, not elsewhere classified, unspecified site: Secondary | ICD-10-CM | POA: Diagnosis not present

## 2012-04-21 DIAGNOSIS — R269 Unspecified abnormalities of gait and mobility: Secondary | ICD-10-CM | POA: Diagnosis not present

## 2012-04-21 DIAGNOSIS — M898X9 Other specified disorders of bone, unspecified site: Secondary | ICD-10-CM | POA: Diagnosis not present

## 2012-04-21 DIAGNOSIS — M48061 Spinal stenosis, lumbar region without neurogenic claudication: Secondary | ICD-10-CM | POA: Diagnosis not present

## 2012-04-21 DIAGNOSIS — R262 Difficulty in walking, not elsewhere classified: Secondary | ICD-10-CM | POA: Diagnosis not present

## 2012-04-21 DIAGNOSIS — M6281 Muscle weakness (generalized): Secondary | ICD-10-CM | POA: Diagnosis not present

## 2012-04-23 DIAGNOSIS — R262 Difficulty in walking, not elsewhere classified: Secondary | ICD-10-CM | POA: Diagnosis not present

## 2012-04-23 DIAGNOSIS — R269 Unspecified abnormalities of gait and mobility: Secondary | ICD-10-CM | POA: Diagnosis not present

## 2012-04-23 DIAGNOSIS — M48061 Spinal stenosis, lumbar region without neurogenic claudication: Secondary | ICD-10-CM | POA: Diagnosis not present

## 2012-04-23 DIAGNOSIS — M6281 Muscle weakness (generalized): Secondary | ICD-10-CM | POA: Diagnosis not present

## 2012-04-24 DIAGNOSIS — R262 Difficulty in walking, not elsewhere classified: Secondary | ICD-10-CM | POA: Diagnosis not present

## 2012-04-24 DIAGNOSIS — M6281 Muscle weakness (generalized): Secondary | ICD-10-CM | POA: Diagnosis not present

## 2012-04-24 DIAGNOSIS — G5610 Other lesions of median nerve, unspecified upper limb: Secondary | ICD-10-CM | POA: Diagnosis not present

## 2012-04-24 DIAGNOSIS — G609 Hereditary and idiopathic neuropathy, unspecified: Secondary | ICD-10-CM | POA: Diagnosis not present

## 2012-04-24 DIAGNOSIS — R269 Unspecified abnormalities of gait and mobility: Secondary | ICD-10-CM | POA: Diagnosis not present

## 2012-04-24 DIAGNOSIS — G2581 Restless legs syndrome: Secondary | ICD-10-CM | POA: Diagnosis not present

## 2012-04-24 DIAGNOSIS — M48061 Spinal stenosis, lumbar region without neurogenic claudication: Secondary | ICD-10-CM | POA: Diagnosis not present

## 2012-04-24 DIAGNOSIS — IMO0002 Reserved for concepts with insufficient information to code with codable children: Secondary | ICD-10-CM | POA: Diagnosis not present

## 2012-04-28 DIAGNOSIS — M48061 Spinal stenosis, lumbar region without neurogenic claudication: Secondary | ICD-10-CM | POA: Diagnosis not present

## 2012-04-29 DIAGNOSIS — M6281 Muscle weakness (generalized): Secondary | ICD-10-CM | POA: Diagnosis not present

## 2012-04-29 DIAGNOSIS — R262 Difficulty in walking, not elsewhere classified: Secondary | ICD-10-CM | POA: Diagnosis not present

## 2012-04-29 DIAGNOSIS — M48061 Spinal stenosis, lumbar region without neurogenic claudication: Secondary | ICD-10-CM | POA: Diagnosis not present

## 2012-04-29 DIAGNOSIS — R269 Unspecified abnormalities of gait and mobility: Secondary | ICD-10-CM | POA: Diagnosis not present

## 2012-05-02 DIAGNOSIS — R262 Difficulty in walking, not elsewhere classified: Secondary | ICD-10-CM | POA: Diagnosis not present

## 2012-05-02 DIAGNOSIS — M6281 Muscle weakness (generalized): Secondary | ICD-10-CM | POA: Diagnosis not present

## 2012-05-02 DIAGNOSIS — R269 Unspecified abnormalities of gait and mobility: Secondary | ICD-10-CM | POA: Diagnosis not present

## 2012-05-02 DIAGNOSIS — M48061 Spinal stenosis, lumbar region without neurogenic claudication: Secondary | ICD-10-CM | POA: Diagnosis not present

## 2012-05-06 DIAGNOSIS — M6281 Muscle weakness (generalized): Secondary | ICD-10-CM | POA: Diagnosis not present

## 2012-05-06 DIAGNOSIS — R262 Difficulty in walking, not elsewhere classified: Secondary | ICD-10-CM | POA: Diagnosis not present

## 2012-05-06 DIAGNOSIS — M48061 Spinal stenosis, lumbar region without neurogenic claudication: Secondary | ICD-10-CM | POA: Diagnosis not present

## 2012-05-06 DIAGNOSIS — R269 Unspecified abnormalities of gait and mobility: Secondary | ICD-10-CM | POA: Diagnosis not present

## 2012-05-08 DIAGNOSIS — R262 Difficulty in walking, not elsewhere classified: Secondary | ICD-10-CM | POA: Diagnosis not present

## 2012-05-08 DIAGNOSIS — M6281 Muscle weakness (generalized): Secondary | ICD-10-CM | POA: Diagnosis not present

## 2012-05-08 DIAGNOSIS — M48061 Spinal stenosis, lumbar region without neurogenic claudication: Secondary | ICD-10-CM | POA: Diagnosis not present

## 2012-05-08 DIAGNOSIS — R269 Unspecified abnormalities of gait and mobility: Secondary | ICD-10-CM | POA: Diagnosis not present

## 2012-05-09 DIAGNOSIS — L820 Inflamed seborrheic keratosis: Secondary | ICD-10-CM | POA: Diagnosis not present

## 2012-05-12 DIAGNOSIS — M3500 Sicca syndrome, unspecified: Secondary | ICD-10-CM | POA: Diagnosis not present

## 2012-05-12 DIAGNOSIS — I771 Stricture of artery: Secondary | ICD-10-CM | POA: Diagnosis not present

## 2012-05-12 DIAGNOSIS — M549 Dorsalgia, unspecified: Secondary | ICD-10-CM | POA: Diagnosis not present

## 2012-05-12 DIAGNOSIS — D500 Iron deficiency anemia secondary to blood loss (chronic): Secondary | ICD-10-CM | POA: Diagnosis not present

## 2012-05-19 DIAGNOSIS — R300 Dysuria: Secondary | ICD-10-CM | POA: Diagnosis not present

## 2012-05-21 ENCOUNTER — Encounter: Payer: Self-pay | Admitting: Vascular Surgery

## 2012-05-22 ENCOUNTER — Ambulatory Visit (INDEPENDENT_AMBULATORY_CARE_PROVIDER_SITE_OTHER): Payer: Medicare Other | Admitting: Vascular Surgery

## 2012-05-22 ENCOUNTER — Encounter (INDEPENDENT_AMBULATORY_CARE_PROVIDER_SITE_OTHER): Payer: Medicare Other | Admitting: *Deleted

## 2012-05-22 ENCOUNTER — Encounter: Payer: Self-pay | Admitting: Vascular Surgery

## 2012-05-22 VITALS — BP 136/62 | HR 71 | Temp 98.3°F | Ht 59.0 in | Wt 165.0 lb

## 2012-05-22 DIAGNOSIS — I739 Peripheral vascular disease, unspecified: Secondary | ICD-10-CM

## 2012-05-22 DIAGNOSIS — I70219 Atherosclerosis of native arteries of extremities with intermittent claudication, unspecified extremity: Secondary | ICD-10-CM | POA: Diagnosis not present

## 2012-05-22 NOTE — Progress Notes (Signed)
[facility] [place] HISTORY AND PHYSICAL   History of Present Illness:  Patient is a 78 y.o. year old female who presents for evaluation of leg swelling.  She recently had a noninvasive vascular exam for evaluation of her leg swelling and she was noted to have a tight stenosis of her left anterior tibial artery with some popliteal artery occlusive disease. The patient has some mild claudication type symptoms in her left calf after walking a quarter mile. However she is also limited in her walking distance by chronic back pain having had 4 prior back operations. She also is limited by shortness of breath at one quarter of a mile. She denies rest pain. She also occasionally has some pain in her right groin and right knee. This is a burning sensation. Other medical problems include neuropathy, hypothyroidism, hypertension, gastroesophageal reflux, arthritis, anxiety all of which are currently stable. She denies history of diabetes.  Past Medical History  Diagnosis Date  . Hypothyroidism   . Hypertension     dr t turner  . Shortness of breath   . H/O hiatal hernia   . Arthritis   . Anxiety   . Neuropathy   . Tremor   . Complication of anesthesia     nigthmares, hallucinations   . Anemia     Past Surgical History  Procedure Date  . Abdominal hysterectomy   . Back surgery     3 back surgeries,   lumbar fusion  . Wrist fracture surgery   . Eye surgery     cateracts  . Falls     variious fall, broken wrist,and toes  . Cardiac catheterization     clean,   7 yrs ago  . Total hip arthroplasty     right     Social History History  Substance Use Topics  . Smoking status: Never Smoker   . Smokeless tobacco: Never Used  . Alcohol Use: No    Family History Family History  Problem Relation Age of Onset  . Heart attack Mother     Allergies  Allergies  Allergen Reactions  . Codeine     Unknown reaction  . Lactose Intolerance (Gi)     Reactions to meds  that contain lactose derivitives  . Other Other (See Comments)    Pain meds received after surgery caused nightmares     Current Outpatient Prescriptions  Medication Sig Dispense Refill  . aspirin EC 81 MG tablet Take 81 mg by mouth daily.      . Calcium Carb-Cholecalciferol (CALCIUM 1000 + D PO) Take by mouth.      . Cholecalciferol (VITAMIN D) 2000 UNITS tablet Take 2,000 Units by mouth daily.      Marland Kitchen dexlansoprazole (DEXILANT) 60 MG capsule Take 60 mg by mouth daily.      Marland Kitchen diltiazem (TIAZAC) 120 MG 24 hr capsule Take 120 mg by mouth every evening.      . docusate sodium (COLACE) 100 MG capsule Take 100 mg by mouth at bedtime.      Marland Kitchen ezetimibe (ZETIA) 10 MG tablet Take 10 mg by mouth every evening.      Marland Kitchen levothyroxine (SYNTHROID, LEVOTHROID) 50 MCG tablet Take 50 mcg by mouth daily.      . metoprolol succinate (TOPROL-XL) 50 MG 24 hr tablet Take 50 mg by mouth every evening.      . Multiple Vitamin (MULITIVITAMIN WITH MINERALS) TABS Take 1 tablet by mouth daily.      Marland Kitchen  pramipexole (MIRAPEX) 1 MG tablet Take 1 mg by mouth 4 (four) times daily.      . pravastatin (PRAVACHOL) 80 MG tablet Take 80 mg by mouth every evening.      . traMADol (ULTRAM) 50 MG tablet Take 50 mg by mouth as needed.      . pregabalin (LYRICA) 75 MG capsule Take 75 mg by mouth 2 (two) times daily.        ROS:   General:  No weight loss, Fever, chills  HEENT: No recent headaches, no nasal bleeding, no visual changes, no sore throat  Neurologic: No dizziness, blackouts, seizures. No recent symptoms of stroke or mini- stroke. No recent episodes of slurred speech, or temporary blindness.  Cardiac: No recent episodes of chest pain/pressure, no shortness of breath at rest.  + shortness of breath with exertion.  Denies history of atrial fibrillation or irregular heartbeat  Vascular: No history of rest pain in feet.  No history of claudication.  No history of non-healing ulcer, No history of DVT   Pulmonary: No  home oxygen, no productive cough, no hemoptysis,  No asthma or wheezing  Musculoskeletal:  [x ] Arthritis, [ x] Low back pain,  [x ] Joint pain  Hematologic:No history of hypercoagulable state.  No history of easy bleeding.  No history of anemia  Gastrointestinal: No hematochezia or melena,  No gastroesophageal reflux, no trouble swallowing  Urinary: [ ]  chronic Kidney disease, [ ]  on HD - [ ]  MWF or [ ]  TTHS, [ ]  Burning with urination, [ ]  Frequent urination, [ ]  Difficulty urinating;   Skin: No rashes  Psychological: +history of anxiety,  No history of depression   Physical Examination  Filed Vitals:   05/22/12 1157  BP: 136/62  Pulse: 71  Temp: 98.3 F (36.8 C)  TempSrc: Oral  Height: 4\' 11"  (1.499 m)  Weight: 165 lb (74.844 kg)  SpO2: 98%    Body mass index is 33.33 kg/(m^2).  General:  Alert and oriented, no acute distress HEENT: Normal Neck: No bruit or JVD Pulmonary: Clear to auscultation bilaterally Cardiac: Regular Rate and Rhythm without murmur Abdomen: Soft, non-tender, non-distended, no mass, no scars Skin: No rash, skin is pink and warm on both feet Extremity Pulses:  2+ radial, brachial, femoral, 2+ right dorsalis pedis pulse absent dorsalis pedis, posterior tibial pulses left leg Musculoskeletal: No deformity or edema  Neurologic: Upper and lower extremity motor 5/5 and symmetric  DATA: Bilateral ABIs today were performed but her vessels were noncompressible and calcified. Waveforms were biphasic. I reviewed and interpreted this study. I also reviewed her recent arterial duplex exam which suggested intertibial artery stenosis as well as some popliteal artery stenosis   ASSESSMENT:   Mild left leg claudication with most likely popliteal and tibial artery occlusive disease. However, the patient's left leg symptoms are multifactorial with back pain, shortness of breath, and claudication all contributing to her decreased walking distance. She is not currently  at risk of limb loss with the amount of occlusive disease that she has. Since she has no history of smoking or diabetes her risk of limb loss life long should be less than 5%.   PLAN:  The patient was counseled to walk for 30 minutes daily. She was reassured that she is not at risk of limb loss currently. She will have followup ABIs in 6 months time. If she decides that the cramping pain in her left calf is limiting her more than her other medical problems  we could consider an arteriogram possible angioplasty and stenting of her left lower extremity. However I believe that currently she has multiple medical problems and she would not achieve significant benefit from improving her circulation alone.  Fabienne Bruns, MD [facility] Office: [phone] Pager: [phone]

## 2012-05-23 NOTE — Addendum Note (Signed)
Addended by: Sharee Pimple on: 05/23/2012 08:09 AM   Modules accepted: Orders

## 2012-05-23 NOTE — Procedures (Unsigned)
LOWER EXTREMITY ARTERIAL DUPLEX  INDICATION:  Bilateral lower extremity pain  HISTORY: Diabetes:  No Cardiac: Hypertension:  Yes Smoking:  Never Previous Surgery:  None  SINGLE LEVEL ARTERIAL EXAM                         RIGHT                LEFT Brachial: Anterior tibial: Posterior tibial: Peroneal: Ankle/Brachial Index:   1.32                 Noncompressible  LOWER EXTREMITY ARTERIAL DUPLEX EXAM  DUPLEX: 1. Patent bilateral lower extremity arterial system with biphasic     waveforms noted throughout. 2. Turbulent flow noted in the bilateral distal external iliac     arteries suggesting more proximal stenosis.  IMPRESSION: 1. Bilateral ankle brachial indices are unreliable due to the presence     of noncompressible vessels. 2. Patent bilateral lower extremity arterial system with biphasic     waveforms and scattered calcific plaque noted throughout.  ___________________________________________ Janetta Hora. Fields, MD  EM/MEDQ  D:  05/23/2012  T:  05/23/2012  Job:  [id number]

## 2012-05-28 ENCOUNTER — Other Ambulatory Visit (HOSPITAL_COMMUNITY): Payer: Self-pay | Admitting: Family Medicine

## 2012-05-28 DIAGNOSIS — Z1231 Encounter for screening mammogram for malignant neoplasm of breast: Secondary | ICD-10-CM

## 2012-06-04 DIAGNOSIS — R894 Abnormal immunological findings in specimens from other organs, systems and tissues: Secondary | ICD-10-CM | POA: Diagnosis not present

## 2012-06-04 DIAGNOSIS — IMO0001 Reserved for inherently not codable concepts without codable children: Secondary | ICD-10-CM | POA: Diagnosis not present

## 2012-06-04 DIAGNOSIS — R5383 Other fatigue: Secondary | ICD-10-CM | POA: Diagnosis not present

## 2012-06-04 DIAGNOSIS — R5381 Other malaise: Secondary | ICD-10-CM | POA: Diagnosis not present

## 2012-06-04 DIAGNOSIS — M545 Low back pain: Secondary | ICD-10-CM | POA: Diagnosis not present

## 2012-06-04 DIAGNOSIS — M1990 Unspecified osteoarthritis, unspecified site: Secondary | ICD-10-CM | POA: Diagnosis not present

## 2012-06-04 DIAGNOSIS — G609 Hereditary and idiopathic neuropathy, unspecified: Secondary | ICD-10-CM | POA: Diagnosis not present

## 2012-06-04 DIAGNOSIS — M2550 Pain in unspecified joint: Secondary | ICD-10-CM | POA: Diagnosis not present

## 2012-06-04 DIAGNOSIS — I739 Peripheral vascular disease, unspecified: Secondary | ICD-10-CM | POA: Diagnosis not present

## 2012-06-17 DIAGNOSIS — M8440XA Pathological fracture, unspecified site, initial encounter for fracture: Secondary | ICD-10-CM | POA: Diagnosis not present

## 2012-06-19 DIAGNOSIS — M8440XA Pathological fracture, unspecified site, initial encounter for fracture: Secondary | ICD-10-CM | POA: Diagnosis not present

## 2012-06-20 DIAGNOSIS — M48061 Spinal stenosis, lumbar region without neurogenic claudication: Secondary | ICD-10-CM | POA: Diagnosis not present

## 2012-07-07 DIAGNOSIS — M1990 Unspecified osteoarthritis, unspecified site: Secondary | ICD-10-CM | POA: Diagnosis not present

## 2012-07-07 DIAGNOSIS — G609 Hereditary and idiopathic neuropathy, unspecified: Secondary | ICD-10-CM | POA: Diagnosis not present

## 2012-07-07 DIAGNOSIS — M3500 Sicca syndrome, unspecified: Secondary | ICD-10-CM | POA: Diagnosis not present

## 2012-07-07 DIAGNOSIS — M545 Low back pain: Secondary | ICD-10-CM | POA: Diagnosis not present

## 2012-07-08 DIAGNOSIS — M48061 Spinal stenosis, lumbar region without neurogenic claudication: Secondary | ICD-10-CM | POA: Diagnosis not present

## 2012-07-09 ENCOUNTER — Ambulatory Visit (HOSPITAL_COMMUNITY)
Admission: RE | Admit: 2012-07-09 | Discharge: 2012-07-09 | Disposition: A | Discharge status: Home / Self Care | Payer: Medicare Other | Source: Ambulatory Visit | Attending: Family Medicine | Admitting: Family Medicine

## 2012-07-09 DIAGNOSIS — Z1231 Encounter for screening mammogram for malignant neoplasm of breast: Secondary | ICD-10-CM | POA: Diagnosis not present

## 2012-07-17 ENCOUNTER — Inpatient Hospital Stay (HOSPITAL_COMMUNITY)
Admission: EM | Admit: 2012-07-17 | Discharge: 2012-07-18 | DRG: 536 | Disposition: A | Discharge status: Skilled Nursing Facility | Payer: Medicare Other | Attending: Family Medicine | Admitting: Family Medicine

## 2012-07-17 ENCOUNTER — Emergency Department (HOSPITAL_COMMUNITY): Payer: Medicare Other

## 2012-07-17 ENCOUNTER — Encounter (HOSPITAL_COMMUNITY): Payer: Self-pay

## 2012-07-17 DIAGNOSIS — S7000XA Contusion of unspecified hip, initial encounter: Secondary | ICD-10-CM | POA: Diagnosis present

## 2012-07-17 DIAGNOSIS — R296 Repeated falls: Secondary | ICD-10-CM | POA: Diagnosis not present

## 2012-07-17 DIAGNOSIS — M545 Low back pain, unspecified: Secondary | ICD-10-CM | POA: Diagnosis not present

## 2012-07-17 DIAGNOSIS — M810 Age-related osteoporosis without current pathological fracture: Secondary | ICD-10-CM | POA: Diagnosis present

## 2012-07-17 DIAGNOSIS — Z79899 Other long term (current) drug therapy: Secondary | ICD-10-CM

## 2012-07-17 DIAGNOSIS — M519 Unspecified thoracic, thoracolumbar and lumbosacral intervertebral disc disorder: Secondary | ICD-10-CM | POA: Diagnosis not present

## 2012-07-17 DIAGNOSIS — N9489 Other specified conditions associated with female genital organs and menstrual cycle: Secondary | ICD-10-CM

## 2012-07-17 DIAGNOSIS — Z7982 Long term (current) use of aspirin: Secondary | ICD-10-CM | POA: Diagnosis not present

## 2012-07-17 DIAGNOSIS — I10 Essential (primary) hypertension: Secondary | ICD-10-CM | POA: Diagnosis present

## 2012-07-17 DIAGNOSIS — Q649 Congenital malformation of urinary system, unspecified: Secondary | ICD-10-CM

## 2012-07-17 DIAGNOSIS — D72829 Elevated white blood cell count, unspecified: Secondary | ICD-10-CM | POA: Diagnosis not present

## 2012-07-17 DIAGNOSIS — Z96649 Presence of unspecified artificial hip joint: Secondary | ICD-10-CM | POA: Diagnosis not present

## 2012-07-17 DIAGNOSIS — N949 Unspecified condition associated with female genital organs and menstrual cycle: Secondary | ICD-10-CM | POA: Diagnosis not present

## 2012-07-17 DIAGNOSIS — E039 Hypothyroidism, unspecified: Secondary | ICD-10-CM | POA: Diagnosis present

## 2012-07-17 DIAGNOSIS — S32599A Other specified fracture of unspecified pubis, initial encounter for closed fracture: Secondary | ICD-10-CM | POA: Diagnosis present

## 2012-07-17 DIAGNOSIS — S3790XA Unspecified injury of unspecified urinary and pelvic organ, initial encounter: Secondary | ICD-10-CM | POA: Diagnosis not present

## 2012-07-17 DIAGNOSIS — N3289 Other specified disorders of bladder: Secondary | ICD-10-CM | POA: Diagnosis not present

## 2012-07-17 DIAGNOSIS — M79609 Pain in unspecified limb: Secondary | ICD-10-CM | POA: Diagnosis not present

## 2012-07-17 DIAGNOSIS — G5790 Unspecified mononeuropathy of unspecified lower limb: Secondary | ICD-10-CM | POA: Diagnosis present

## 2012-07-17 DIAGNOSIS — W010XXA Fall on same level from slipping, tripping and stumbling without subsequent striking against object, initial encounter: Secondary | ICD-10-CM | POA: Diagnosis present

## 2012-07-17 DIAGNOSIS — R6889 Other general symptoms and signs: Secondary | ICD-10-CM | POA: Diagnosis not present

## 2012-07-17 DIAGNOSIS — M25559 Pain in unspecified hip: Secondary | ICD-10-CM | POA: Diagnosis not present

## 2012-07-17 DIAGNOSIS — S32509A Unspecified fracture of unspecified pubis, initial encounter for closed fracture: Principal | ICD-10-CM | POA: Diagnosis present

## 2012-07-17 DIAGNOSIS — I70219 Atherosclerosis of native arteries of extremities with intermittent claudication, unspecified extremity: Secondary | ICD-10-CM

## 2012-07-17 DIAGNOSIS — S72009A Fracture of unspecified part of neck of unspecified femur, initial encounter for closed fracture: Secondary | ICD-10-CM | POA: Diagnosis not present

## 2012-07-17 HISTORY — DX: Age-related osteoporosis without current pathological fracture: M81.0

## 2012-07-17 LAB — POCT I-STAT, CHEM 8
Calcium, Ion: 1.31 mmol/L — ABNORMAL HIGH (ref 1.13–1.30)
Creatinine, Ser: 0.7 mg/dL (ref 0.50–1.10)
Glucose, Bld: 117 mg/dL — ABNORMAL HIGH (ref 70–99)
Hemoglobin: 12.2 g/dL (ref 12.0–15.0)
Sodium: 141 mEq/L (ref 135–145)
TCO2: 23 mmol/L (ref 0–100)

## 2012-07-17 LAB — CBC
MCHC: 32.2 g/dL (ref 30.0–36.0)
MCV: 87.9 fL (ref 78.0–100.0)
Platelets: 343 10*3/uL (ref 150–400)
RDW: 13.5 % (ref 11.5–15.5)
WBC: 18.6 10*3/uL — ABNORMAL HIGH (ref 4.0–10.5)

## 2012-07-17 LAB — URINALYSIS, ROUTINE W REFLEX MICROSCOPIC
Glucose, UA: NEGATIVE mg/dL
Hgb urine dipstick: NEGATIVE
Ketones, ur: NEGATIVE mg/dL
Protein, ur: NEGATIVE mg/dL

## 2012-07-17 MED ORDER — EZETIMIBE 10 MG PO TABS
10.0000 mg | ORAL_TABLET | Freq: Every evening | ORAL | Status: DC
  Administered 2012-07-17: 10 mg via ORAL
  Filled 2012-07-17 (×2): qty 1

## 2012-07-17 MED ORDER — MORPHINE SULFATE 2 MG/ML IJ SOLN
1.0000 mg | INTRAMUSCULAR | Status: DC | PRN
  Administered 2012-07-17 – 2012-07-18 (×2): 1 mg via INTRAVENOUS
  Filled 2012-07-17 (×2): qty 1

## 2012-07-17 MED ORDER — SODIUM CHLORIDE 0.9 % IV SOLN: 250.0000 mL | INTRAVENOUS | Status: DC | PRN

## 2012-07-17 MED ORDER — CALCIUM CARBONATE 1250 (500 CA) MG PO TABS
1.0000 | ORAL_TABLET | Freq: Every day | ORAL | Status: DC
  Administered 2012-07-17 – 2012-07-18 (×2): 500 mg via ORAL
  Filled 2012-07-17 (×2): qty 1

## 2012-07-17 MED ORDER — MORPHINE SULFATE 2 MG/ML IJ SOLN
2.0000 mg | INTRAMUSCULAR | Status: DC | PRN
  Administered 2012-07-17: 2 mg via INTRAVENOUS

## 2012-07-17 MED ORDER — ONDANSETRON HCL 4 MG/2ML IJ SOLN
4.0000 mg | Freq: Three times a day (TID) | INTRAMUSCULAR | Status: DC | PRN
  Administered 2012-07-17: 4 mg via INTRAVENOUS
  Filled 2012-07-17: qty 2

## 2012-07-17 MED ORDER — SODIUM CHLORIDE 0.9 % IJ SOLN: 3.0000 mL | Freq: Two times a day (BID) | INTRAMUSCULAR | Status: DC

## 2012-07-17 MED ORDER — SODIUM CHLORIDE 0.9 % IV SOLN: INTRAVENOUS | Status: DC

## 2012-07-17 MED ORDER — MORPHINE SULFATE 2 MG/ML IJ SOLN
2.0000 mg | INTRAMUSCULAR | Status: DC | PRN
  Filled 2012-07-17: qty 1

## 2012-07-17 MED ORDER — DOCUSATE SODIUM 100 MG PO CAPS
100.0000 mg | ORAL_CAPSULE | Freq: Every day | ORAL | Status: DC
  Administered 2012-07-17: 100 mg via ORAL

## 2012-07-17 MED ORDER — ADULT MULTIVITAMIN W/MINERALS CH
1.0000 | ORAL_TABLET | Freq: Every day | ORAL | Status: DC
  Administered 2012-07-18: 1 via ORAL
  Filled 2012-07-17 (×2): qty 1

## 2012-07-17 MED ORDER — ONDANSETRON HCL 4 MG PO TABS: 4.0000 mg | ORAL_TABLET | Freq: Four times a day (QID) | ORAL | Status: DC | PRN

## 2012-07-17 MED ORDER — SODIUM CHLORIDE 0.9 % IV BOLUS (SEPSIS)
250.0000 mL | Freq: Once | INTRAVENOUS | Status: AC
  Administered 2012-07-17: 250 mL via INTRAVENOUS

## 2012-07-17 MED ORDER — LEVOTHYROXINE SODIUM 50 MCG PO TABS
50.0000 ug | ORAL_TABLET | Freq: Every day | ORAL | Status: DC
  Administered 2012-07-18: 50 ug via ORAL
  Filled 2012-07-17 (×2): qty 1

## 2012-07-17 MED ORDER — SODIUM CHLORIDE 0.9 % IJ SOLN: 3.0000 mL | INTRAMUSCULAR | Status: DC | PRN

## 2012-07-17 MED ORDER — SIMVASTATIN 40 MG PO TABS
40.0000 mg | ORAL_TABLET | Freq: Every day | ORAL | Status: DC
  Filled 2012-07-17: qty 1

## 2012-07-17 MED ORDER — VITAMIN D 50 MCG (2000 UT) PO TABS: 2000.0000 [IU] | ORAL_TABLET | Freq: Every day | ORAL | Status: DC

## 2012-07-17 MED ORDER — VITAMIN D3 25 MCG (1000 UNIT) PO TABS
2000.0000 [IU] | ORAL_TABLET | Freq: Every day | ORAL | Status: DC
  Administered 2012-07-18: 2000 [IU] via ORAL
  Filled 2012-07-17 (×3): qty 2

## 2012-07-17 MED ORDER — PRAMIPEXOLE DIHYDROCHLORIDE 1 MG PO TABS
1.0000 mg | ORAL_TABLET | Freq: Four times a day (QID) | ORAL | Status: DC
  Administered 2012-07-17 – 2012-07-18 (×3): 1 mg via ORAL
  Filled 2012-07-17 (×5): qty 1

## 2012-07-17 MED ORDER — ONDANSETRON HCL 4 MG/2ML IJ SOLN: 4.0000 mg | Freq: Four times a day (QID) | INTRAMUSCULAR | Status: DC | PRN

## 2012-07-17 MED ORDER — SODIUM CHLORIDE 0.9 % IV SOLN
INTRAVENOUS | Status: DC
  Administered 2012-07-17: 15:00:00 via INTRAVENOUS

## 2012-07-17 MED ORDER — OXYCODONE HCL 5 MG PO TABS
5.0000 mg | ORAL_TABLET | ORAL | Status: DC | PRN
  Administered 2012-07-18 (×3): 5 mg via ORAL
  Filled 2012-07-17 (×4): qty 1

## 2012-07-17 MED ORDER — PANTOPRAZOLE SODIUM 40 MG PO TBEC
40.0000 mg | DELAYED_RELEASE_TABLET | Freq: Every day | ORAL | Status: DC
  Administered 2012-07-18: 40 mg via ORAL
  Filled 2012-07-17: qty 1

## 2012-07-17 MED ORDER — POLYETHYLENE GLYCOL 3350 17 G PO PACK
17.0000 g | PACK | Freq: Every day | ORAL | Status: DC
  Administered 2012-07-17 – 2012-07-18 (×2): 17 g via ORAL

## 2012-07-17 MED ORDER — DILTIAZEM HCL ER BEADS 120 MG PO CP24
120.0000 mg | ORAL_CAPSULE | Freq: Every day | ORAL | Status: DC
  Filled 2012-07-17: qty 1

## 2012-07-17 MED ORDER — TERIPARATIDE (RECOMBINANT) 600 MCG/2.4ML ~~LOC~~ SOLN: 20.0000 ug | Freq: Every morning | SUBCUTANEOUS | Status: DC

## 2012-07-17 NOTE — ED Provider Notes (Signed)
History     CSN: [id number]  Arrival date & time 07/17/12  1015   First MD Initiated Contact with Patient 07/17/12 1018      Chief Complaint  Patient presents with  . Fall  . Tailbone Pain  . Hip Pain     HPI Pt was seen at 1045.  Per pt and husband, c/o sudden onset and resolution of one episodes of slip and fall that occurred this morning PTA.  Pt states she fell to her right side, landed on her right hip and arm.  Pt c/o abrasion to her right forearm, right hip and pelvis "pain."  Denies LOC/syncope, no AMS since fall, no CP/SOB, no abd pain, no headache, no neck pain, no tingling/numbness in extremities, no focal motor weakness.       Past Medical History  Diagnosis Date  . Hypothyroidism   . Hypertension     dr t turner  . Shortness of breath   . H/O hiatal hernia   . Arthritis   . Anxiety   . Neuropathy   . Tremor   . Complication of anesthesia     nigthmares, hallucinations   . Anemia   . Osteoporosis     Past Surgical History  Procedure Date  . Abdominal hysterectomy   . Back surgery     3 back surgeries,   lumbar fusion  . Wrist fracture surgery   . Eye surgery     cateracts  . Falls     variious fall, broken wrist,and toes  . Cardiac catheterization     clean,   7 yrs ago  . Total hip arthroplasty     right    Family History  Problem Relation Age of Onset  . Heart attack Mother     History  Substance Use Topics  . Smoking status: Never Smoker   . Smokeless tobacco: Never Used  . Alcohol Use: No    Review of Systems ROS: Statement: All systems negative except as marked or noted in the HPI; Constitutional: Negative for fever and chills. ; ; Eyes: Negative for eye pain, redness and discharge. ; ; ENMT: Negative for ear pain, hoarseness, nasal congestion, sinus pressure and sore throat. ; ; Cardiovascular: Negative for chest pain, palpitations, diaphoresis, dyspnea and peripheral edema. ; ; Respiratory: Negative for cough, wheezing and stridor.  ; ; Gastrointestinal: Negative for nausea, vomiting, diarrhea, abdominal pain, blood in stool, hematemesis, jaundice and rectal bleeding. . ; ; Genitourinary: Negative for dysuria, flank pain and hematuria. ; ; Musculoskeletal: +right hip pain, right pelvic pain. Negative for back pain and neck pain. Negative for swelling..; ; Skin: Negative for pruritus, rash, abrasions, blisters, bruising and skin lesion.; ; Neuro: Negative for headache, lightheadedness and neck stiffness. Negative for weakness, altered level of consciousness , altered mental status, extremity weakness, paresthesias, involuntary movement, seizure and syncope.     Allergies  Codeine; Lactose intolerance (gi); and Other  Home Medications   Current Outpatient Rx  Name Route Sig Dispense Refill  . ASPIRIN EC 81 MG PO TBEC Oral Take 81 mg by mouth daily.    Marland Kitchen CALCIUM 500 PO Oral Take 1,000 mg by mouth daily.    Marland Kitchen VITAMIN D 2000 UNITS PO TABS Oral Take 2,000 Units by mouth daily.    . DEXLANSOPRAZOLE 60 MG PO CPDR Oral Take 60 mg by mouth daily.    Marland Kitchen DILTIAZEM HCL ER BEADS 120 MG PO CP24 Oral Take 120 mg by mouth daily.     Marland Kitchen  DOCUSATE SODIUM 100 MG PO CAPS Oral Take 100 mg by mouth at bedtime.    Marland Kitchen EZETIMIBE 10 MG PO TABS Oral Take 10 mg by mouth every evening.    Marland Kitchen LEVOTHYROXINE SODIUM 50 MCG PO TABS Oral Take 50 mcg by mouth daily.    Marland Kitchen METOPROLOL SUCCINATE ER 50 MG PO TB24 Oral Take 50 mg by mouth every evening.    . ADULT MULTIVITAMIN W/MINERALS CH Oral Take 1 tablet by mouth daily.    Marland Kitchen POLYETHYLENE GLYCOL 3350 PO PACK Oral Take 17 g by mouth daily.    Marland Kitchen PRAMIPEXOLE DIHYDROCHLORIDE 1 MG PO TABS Oral Take 1 mg by mouth 4 (four) times daily.    Marland Kitchen PRAVASTATIN SODIUM 80 MG PO TABS Oral Take 80 mg by mouth every evening.    . TERIPARATIDE (RECOMBINANT) 600 MCG/2.4ML [place] SOLN Subcutaneous Inject 20 mcg into the skin every morning.    Marland Kitchen TRAMADOL HCL 50 MG PO TABS Oral Take 50-100 mg by mouth every 8 (eight) hours as needed. For pain       BP 154/70  Pulse 73  Temp 98.1 F (36.7 C) (Oral)  Resp 16  SpO2 98%  Physical Exam 1050: Physical examination:  Nursing notes reviewed; Vital signs and O2 SAT reviewed;  Constitutional: Well developed, Well nourished, Well hydrated, In no acute distress; Head:  Normocephalic, atraumatic; Eyes: EOMI, PERRL, No scleral icterus; ENMT: Mouth and pharynx normal, Mucous membranes moist; Neck: Supple, Full range of motion, No lymphadenopathy; Cardiovascular: Regular rate and rhythm, No murmur, rub, or gallop; Respiratory: Breath sounds clear & equal bilaterally, No rales, rhonchi, wheezes.  Speaking full sentences with ease, Normal respiratory effort/excursion; Chest: Nontender, Movement normal; Abdomen: Soft, Nontender, Nondistended, Normal bowel sounds; Genitourinary: No CVA tenderness; Spine:  No midline CS, TS, LS tenderness.;; Extremities: Pelvis stable.  +TTP right hip, right pubic bone area and right SI joint/sacrum.  NMS intact right foot.  NT right knee/ankle/foot.  FROM LLE, bilat UE's without tenderness or deformity. Pulses normal, No edema, No calf edema or asymmetry.; Neuro: AA&Ox3, Major CN grossly intact.  Speech clear. No facial droop. No gross focal motor deficits in extremities.; Skin: Color pale, Warm, Dry, +superficial hemostatic abrasion right dorsal forearm.   ED Course  Procedures   1345:  T/C to Ortho Dr. Darrelyn Hillock, case discussed, including:  HPI, pertinent PM/SHx, VS/PE, dx testing, ED course and treatment:  With hematoma, pt needs to be admitted for H/H checks, please admit to medicine service, he can consult prn (have medicine service call if needs him).  Dx testing d/w pt and family.  Questions answered.  Verb understanding, agreeable to admit.  ED RN to obtain labs before I call [facility].  1440:  Pt continues to mentate per baseline, resps easy, NAD.  T/C to [facility] NP Magnus Ivan, case discussed, including:  HPI, pertinent PM/SHx, VS/PE, dx testing, ED course and treatment:   Agreeable to admit, requests to write temporary orders, obtain medical/Ortho bed to team 8/Dr. Rito Ehrlich.       MDM  MDM Reviewed: nursing note, vitals and previous chart Reviewed previous: labs Interpretation: x-ray, CT scan and labs    Dg Lumbar Spine Complete 07/17/2012  *RADIOLOGY REPORT*  Clinical Data: Fall.  Back pain and leg pain.  LUMBAR SPINE - COMPLETE 4+ VIEW  Comparison: 07/08/2012 exam from [facility]  Findings: Acute right pubic fractures noted.  Slight irregularity of the peripheral margins of the right sacral arcuate lines raise the possibility of a nondisplaced right  sacral fracture.  Posterolateral graft material noted with posterolateral rod pedicle screw fixation at L2 - L3-L4 - L5-S1.  Interbody markers are noted at each level except L3-4.  Grade 1 posterior subluxation of L2-3 and grade 1 anterior subluxation of L4-5 appears similar to 07/08/2012.  No specific signs of hardware failure.  Stable endplate sclerosis and vacuum disc phenomenon with loss of disc height noted at L1-2.  IMPRESSION:  1.  Similar postoperative appearance the lumbar spine. 2.  Right pubic bone fractures and possible right sacral fracture.  Original Report Authenticated By: Dellia Cloud, M.D.   Dg Hip Complete Right 07/17/2012  *RADIOLOGY REPORT*  Clinical Data: Fall.  Pelvic pain.  RIGHT HIP - COMPLETE 2+ VIEW  Comparison: 07/28/2007  Findings: Fracture the right inferior pubic ramus noted with a medial fracture of the right superior pubic ramus.  No definite fracture of the left pubic rami.  I cannot completely exclude a right sacral fracture.  Lower lumbar fixation hardware noted along with a right total hip prosthesis.  No fracture along the femoral component of the prosthesis is noted.  Vascular calcifications noted.  IMPRESSION:  1.  Fractures of the right pubic rami and probably of the right sacrum. 2.  Atherosclerosis. 3.  Right total hip prosthesis and lower lumbar fixation hardware.   Original Report Authenticated By: Dellia Cloud, M.D.   Ct Pelvis Wo Contrast 07/17/2012  *RADIOLOGY REPORT*  Clinical Data:  Right groin and hip pain status post fall. Evaluate pelvic fractures.  CT PELVIS WITHOUT CONTRAST  Technique:  Multidetector CT imaging of the pelvis was performed following the standard protocol without intravenous contrast.  Comparison:  Radiographs same date.  Pelvic CT 03/27/2007.  Findings:  As demonstrated radiographically, there are minimally- displaced fractures of the right superior and inferior pubic rami. There is no definite acute sacral fracture.  Mild asymmetry of the sacroiliac joints and adjacent subchondral sclerosis are unchanged from the prior CT.  There is no evidence of acute left-sided pelvic fracture or diastasis at the symphysis pubis.  There are postsurgical changes in the lower lumbar spine status post laminectomy and fusion.  The postsurgical changes are incompletely imaged.  There is no hardware loosening.  Patient is status post right total hip arthroplasty.  The femoral prosthesis is incompletely imaged.  There is no evidence of proximal femur fracture.  There is a large pelvic hematoma associated with the pubic rami fractures with superior extension along the pelvic side wall.  This measures up to 11.7 x 5.0 cm transverse and displaces the urinary bladder towards the left.  Hemorrhage extends anteriorly along the bladder.  No peritoneal fluid or extension into the abdominal retroperitoneum is evident.  IMPRESSION:  1.  Mildly displaced fractures of the right superior and inferior pubic rami. 2.  No evidence of acute sacral fracture or sacroiliac joint diastasis. 3.  Large right pelvic hematoma with bladder displacement to the left.  Original Report Authenticated By: Gerrianne Scale, M.D.    Results for orders placed during the hospital encounter of 07/17/12  CBC      Component Value Range   WBC 18.6 (*) 4.0 - 10.5 K/uL   RBC 4.13  3.87 - 5.11  MIL/uL   Hemoglobin 11.7 (*) 12.0 - 15.0 g/dL   HCT 40.9  81.1 - 91.4 %   MCV 87.9  78.0 - 100.0 fL   MCH 28.3  26.0 - 34.0 pg   MCHC 32.2  30.0 - 36.0 g/dL  RDW 13.5  11.5 - 15.5 %   Platelets 343  150 - 400 K/uL  POCT I-STAT, CHEM 8      Component Value Range   Sodium 141  135 - 145 mEq/L   Potassium 4.2  3.5 - 5.1 mEq/L   Chloride 106  96 - 112 mEq/L   BUN 13  6 - 23 mg/dL   Creatinine, Ser 4.09  0.50 - 1.10 mg/dL   Glucose, Bld 811 (*) 70 - 99 mg/dL   Calcium, Ion 9.14 (*) 1.13 - 1.30 mmol/L   TCO2 23  0 - 100 mmol/L   Hemoglobin 12.2  12.0 - 15.0 g/dL   HCT 78.2  95.6 - 21.3 %           Laray Anger, DO 07/19/12 1316

## 2012-07-17 NOTE — H&P (Signed)
[facility] History and Physical  [name] ZOX:[id number] DOB: 06-20-1931 DOA: 07/17/2012  Referring physician: Dr Clarene Duke PCP: Allean Found, MD   Chief Complaint: Hip pain  HPI:  Patient is a 78 y/o with PMH as listed below but of note patient has history of Osteoporosis and presents to the ED after a mechanical fall.  Reportedly she slipped and fell today and subsequently developed discomfort with weight bearing.  Denies any fainting, epilepsy, or heart palpitations prior to episode.  Patient reports history of prior orthopaedic procedures including lumbar back disk fusions, wrist fracture, and right hip replacement.  The pain is sharp and mainly located at right hip, does not radiate and is made worse with movement and weight bearing.    In the ED work up (CT of pelvis c/o contrast) revealed : 1. Mildly displaced fractures of the right superior and inferior  pubic rami.  2. No evidence of acute sacral fracture or sacroiliac joint  diastasis.  3. Large right pelvic hematoma with bladder displacement to the  Left.  Per ED conversation with Ortho patient will no need surgery but should get observed overnight to ensure that pain is controlled and hematoma does not get worse.  Subsequently we were asked to evaluate patient for further recommendations.  Review of Systems:  Pt denies any BRBPR, Hematuria, nausea, emesis, fevers, chills, HA's, blurred vision, chest pain, palpitations, focal neurological weakness, seizure like activity, rhinorrhea, hearing loss/difficulty  Past Medical History  Diagnosis Date  . Hypothyroidism   . Hypertension     dr t turner  . Shortness of breath   . H/O hiatal hernia   . Arthritis   . Anxiety   . Neuropathy   . Tremor   . Complication of anesthesia     nigthmares, hallucinations   . Anemia   . Osteoporosis    Past Surgical History  Procedure Date  . Abdominal hysterectomy   . Back surgery     3 back surgeries,   lumbar  fusion  . Wrist fracture surgery   . Eye surgery     cateracts  . Falls     variious fall, broken wrist,and toes  . Cardiac catheterization     clean,   7 yrs ago  . Total hip arthroplasty     right   Social History:  reports that she has never smoked. She has never used smokeless tobacco. She reports that she does not drink alcohol or use illicit drugs. Pt lives at ALF *Patient can participate in ADL  Allergies  Allergen Reactions  . Codeine     Unknown reaction  . Lactose Intolerance (Gi)     Reactions to meds that contain lactose derivitives  . Other Other (See Comments)    Pain meds received after surgery caused nightmares (Percocet,Hydrocodone,Morphine)    Family History  Problem Relation Age of Onset  . Heart attack Mother   None new reported when patient asked directly  Prior to Admission medications   Medication Sig Start Date End Date Taking? Authorizing Provider  aspirin EC 81 MG tablet Take 81 mg by mouth daily.   Yes Historical Provider, MD  Calcium Carbonate (CALCIUM 500 PO) Take 1,000 mg by mouth daily.   Yes Historical Provider, MD  Cholecalciferol (VITAMIN D) 2000 UNITS tablet Take 2,000 Units by mouth daily.   Yes Historical Provider, MD  dexlansoprazole (DEXILANT) 60 MG capsule Take 60 mg by mouth daily.   Yes Historical Provider, MD  diltiazem [facility]) 120  MG 24 hr capsule Take 120 mg by mouth daily.    Yes Historical Provider, MD  docusate sodium (COLACE) 100 MG capsule Take 100 mg by mouth at bedtime.   Yes Historical Provider, MD  ezetimibe (ZETIA) 10 MG tablet Take 10 mg by mouth every evening.   Yes Historical Provider, MD  levothyroxine (SYNTHROID, LEVOTHROID) 50 MCG tablet Take 50 mcg by mouth daily.   Yes Historical Provider, MD  metoprolol succinate (TOPROL-XL) 50 MG 24 hr tablet Take 50 mg by mouth every evening.   Yes Historical Provider, MD  Multiple Vitamin (MULITIVITAMIN WITH MINERALS) TABS Take 1 tablet by mouth daily.   Yes Historical  Provider, MD  polyethylene glycol (MIRALAX / GLYCOLAX) packet Take 17 g by mouth daily.   Yes Historical Provider, MD  pramipexole (MIRAPEX) 1 MG tablet Take 1 mg by mouth 4 (four) times daily.   Yes Historical Provider, MD  pravastatin (PRAVACHOL) 80 MG tablet Take 80 mg by mouth every evening.   Yes Historical Provider, MD  Teriparatide, Recombinant, (FORTEO) 600 MCG/2.4ML SOLN Inject 20 mcg into the skin every morning.   Yes Historical Provider, MD  traMADol (ULTRAM) 50 MG tablet Take 50-100 mg by mouth every 8 (eight) hours as needed. For pain 05/12/12  Yes Historical Provider, MD   Physical Exam: Filed Vitals:   07/17/12 1020 07/17/12 1546  BP: 154/70 100/51  Pulse: 73 77  Temp: 98.1 F (36.7 C) 98 F (36.7 C)  TempSrc: Oral Oral  Resp: 16 20  SpO2: 98% 97%     General:  Pt in NAD, A and O x 3  Eyes: EOMI, PERRLA  ENT: WNL's  Neck: supple, no masses on visual inspection  Cardiovascular: RRR, No MRG  Respiratory: CTA BL, no wheezes  Abdomen: Some discomfort with palpation, soft, nd  Skin: No rashes, diaphoresis  Musculoskeletal: discomfort over right hip and pain with movement  Psychiatric: mood and affect appropriate  Neurologic: None focal, answers questions appropriately.  Labs on Admission:  Basic Metabolic Panel:  Lab 07/17/12 4098  NA 141  K 4.2  CL 106  CO2 --  GLUCOSE 117*  BUN 13  CREATININE 0.70  CALCIUM --  MG --  PHOS --   Liver Function Tests: No results found for this basename: AST:5,ALT:5,ALKPHOS:5,BILITOT:5,PROT:5,ALBUMIN:5 in the last 168 hours No results found for this basename: LIPASE:5,AMYLASE:5 in the last 168 hours No results found for this basename: AMMONIA:5 in the last 168 hours CBC:  Lab 07/17/12 1504 07/17/12 1445  WBC -- 18.6*  NEUTROABS -- --  HGB 12.2 11.7*  HCT 36.0 36.3  MCV -- 87.9  PLT -- 343   Cardiac Enzymes: No results found for this basename: CKTOTAL:5,CKMB:5,CKMBINDEX:5,TROPONINI:5 in the last 168  hours  BNP (last 3 results) No results found for this basename: PROBNP:3 in the last 8760 hours CBG: No results found for this basename: GLUCAP:5 in the last 168 hours  Radiological Exams on Admission: Dg Lumbar Spine Complete  07/17/2012  *RADIOLOGY REPORT*  Clinical Data: Fall.  Back pain and leg pain.  LUMBAR SPINE - COMPLETE 4+ VIEW  Comparison: 07/08/2012 exam from [facility]  Findings: Acute right pubic fractures noted.  Slight irregularity of the peripheral margins of the right sacral arcuate lines raise the possibility of a nondisplaced right sacral fracture.  Posterolateral graft material noted with posterolateral rod pedicle screw fixation at L2 - L3-L4 - L5-S1.  Interbody markers are noted at each level except L3-4.  Grade 1 posterior subluxation  of L2-3 and grade 1 anterior subluxation of L4-5 appears similar to 07/08/2012.  No specific signs of hardware failure.  Stable endplate sclerosis and vacuum disc phenomenon with loss of disc height noted at L1-2.  IMPRESSION:  1.  Similar postoperative appearance the lumbar spine. 2.  Right pubic bone fractures and possible right sacral fracture.  Original Report Authenticated By: Dellia Cloud, M.D.   Dg Hip Complete Right  07/17/2012  *RADIOLOGY REPORT*  Clinical Data: Fall.  Pelvic pain.  RIGHT HIP - COMPLETE 2+ VIEW  Comparison: 07/28/2007  Findings: Fracture the right inferior pubic ramus noted with a medial fracture of the right superior pubic ramus.  No definite fracture of the left pubic rami.  I cannot completely exclude a right sacral fracture.  Lower lumbar fixation hardware noted along with a right total hip prosthesis.  No fracture along the femoral component of the prosthesis is noted.  Vascular calcifications noted.  IMPRESSION:  1.  Fractures of the right pubic rami and probably of the right sacrum. 2.  Atherosclerosis. 3.  Right total hip prosthesis and lower lumbar fixation hardware.  Original Report Authenticated By:  Dellia Cloud, M.D.   Ct Pelvis Wo Contrast  07/17/2012  *RADIOLOGY REPORT*  Clinical Data:  Right groin and hip pain status post fall. Evaluate pelvic fractures.  CT PELVIS WITHOUT CONTRAST  Technique:  Multidetector CT imaging of the pelvis was performed following the standard protocol without intravenous contrast.  Comparison:  Radiographs same date.  Pelvic CT 03/27/2007.  Findings:  As demonstrated radiographically, there are minimally- displaced fractures of the right superior and inferior pubic rami. There is no definite acute sacral fracture.  Mild asymmetry of the sacroiliac joints and adjacent subchondral sclerosis are unchanged from the prior CT.  There is no evidence of acute left-sided pelvic fracture or diastasis at the symphysis pubis.  There are postsurgical changes in the lower lumbar spine status post laminectomy and fusion.  The postsurgical changes are incompletely imaged.  There is no hardware loosening.  Patient is status post right total hip arthroplasty.  The femoral prosthesis is incompletely imaged.  There is no evidence of proximal femur fracture.  There is a large pelvic hematoma associated with the pubic rami fractures with superior extension along the pelvic side wall.  This measures up to 11.7 x 5.0 cm transverse and displaces the urinary bladder towards the left.  Hemorrhage extends anteriorly along the bladder.  No peritoneal fluid or extension into the abdominal retroperitoneum is evident.  IMPRESSION:  1.  Mildly displaced fractures of the right superior and inferior pubic rami. 2.  No evidence of acute sacral fracture or sacroiliac joint diastasis. 3.  Large right pelvic hematoma with bladder displacement to the left.  Original Report Authenticated By: Gerrianne Scale, M.D.    EKG: Independently reviewed. Last EKG reviewd on 03/27/12  Assessment/Plan Active Problems:  Fracture Of Multiple Pubic Rami  Hematoma of hip  Leukocytosis HTN   1. Fracture of  Multiple pubic Rami - At this point will manage pain - Order physical therapy to evaluate and treat patient. - Secondary to fall but complicated by history of osteoporosis.  2. Leukocytosis - Likely reactive given recent history of fall - There are no sources of infection  3. Hematoma - Secondary to recent fall - Will recheck cbc tomorrow - If stable may consider d/c - Hold aspirin  4. HTN - Will hold antihypertensive at this juncture and monitor blood pressures  5. Osteoporosis -  Likely contributing to # 1.  Will plan on continuing home regimen.  6. Hypothyroidism - Continue synthroid.  Currently stable.  7. DVT prophylaxis - consider ted hose - Given hematoma will not place on heparin or lovenox  Code Status: Full Family Communication: Pt and husband at bedside Disposition Plan: Pending serial h/h's and pain control.  May d/c in 1-2 days.  Time spent: > 50 minutes  [name] [facility] Pager [phone]  If 7PM-7AM, please contact night-coverage [email or web address] Password [facility] 07/17/2012, 5:40 PM

## 2012-07-17 NOTE — ED Notes (Signed)
LSB, and head blocks removed from patient with assistance of 3 RNS and 1 Tech. Patient c/o posterior neck, right groin pain, coccyx pain.

## 2012-07-17 NOTE — ED Notes (Signed)
EAV:[id number]<JX> Expected date:07/17/12<BR> Expected time:10:06 AM<BR> Means of arrival:Ambulance<BR> Comments:<BR> 74yoF, fall/LSB,hip and neck pain

## 2012-07-17 NOTE — ED Notes (Signed)
Attempted to call report. Receiving nurse with another patient and will return call.

## 2012-07-17 NOTE — Progress Notes (Signed)
Pt's bp dropped to 87/54. Paged on call md. Claiborne Billings called back and ordered a 250 NS bolus. Will continue to monitor.

## 2012-07-17 NOTE — Progress Notes (Signed)
[name], is a 78 y.o. female,   MRN: [id number]  -  DOB - 1931/03/29  Outpatient Primary MD for the patient is Allean Found, MD  in for    Chief Complaint  Patient presents with  . Fall  . Tailbone Pain  . Hip Pain     Blood pressure 100/51, pulse 77, temperature 98 F (36.7 C), temperature source Oral, resp. rate 20, SpO2 97.00%.  Active Problems:  Fracture Of Multiple Pubic Rami  Hematoma of hip  Leukocytosis    Delightful 78 yo hx HTN, multiple back surgeries, Left leg claudication, peripheral neuropathy presents to ed s/p mechanical fall this am. Complained hip pain and "tail bone pain". Denies hitting head, denies LOC. No CP palpitations. Denies recent illness, fever, chills. Pt is on 81mg  ASA  In ed rt hip xray yields  Fractures of the right pubic rami and probably of the right sacrum. Atherosclerosis. Right total hip prosthesis and lower lumbar fixation hardware.   CT yields Mildly displaced fractures of the right superior and inferior pubic rami.  No evidence of acute sacral fracture or sacroiliac joint diastasis.  Large right pelvic hematoma with bladder displacement to the left.  ED MD discussed with ortho who recommended admit for monitoring of Hg/hematoma, PT and pain control.   VSS. NAD. Will admit medical/ortho unit

## 2012-07-17 NOTE — ED Notes (Signed)
Patient transported to X-ray 

## 2012-07-17 NOTE — ED Notes (Signed)
IV Team called back and to attempt to start IV.

## 2012-07-17 NOTE — ED Notes (Signed)
Per EMS- Patient is a resident of [facility].Staff reported that the patient stumbled and fell backwards. Patient denies dizziness, LOC, or hitting head. Patient c/o posterior neck,  Right hip pain, and coccyx pain. Patient does take Aspirin. Patient has a history of right hip replacement, spinal fusion, and osteoporosis.

## 2012-07-18 DIAGNOSIS — S32509A Unspecified fracture of unspecified pubis, initial encounter for closed fracture: Secondary | ICD-10-CM | POA: Diagnosis not present

## 2012-07-18 DIAGNOSIS — S72009A Fracture of unspecified part of neck of unspecified femur, initial encounter for closed fracture: Secondary | ICD-10-CM | POA: Diagnosis not present

## 2012-07-18 DIAGNOSIS — I10 Essential (primary) hypertension: Secondary | ICD-10-CM | POA: Diagnosis not present

## 2012-07-18 DIAGNOSIS — M25559 Pain in unspecified hip: Secondary | ICD-10-CM | POA: Diagnosis not present

## 2012-07-18 DIAGNOSIS — S7000XA Contusion of unspecified hip, initial encounter: Secondary | ICD-10-CM | POA: Diagnosis not present

## 2012-07-18 DIAGNOSIS — D72829 Elevated white blood cell count, unspecified: Secondary | ICD-10-CM | POA: Diagnosis not present

## 2012-07-18 LAB — CBC
HCT: 29.6 % — ABNORMAL LOW (ref 36.0–46.0)
Hemoglobin: 9.8 g/dL — ABNORMAL LOW (ref 12.0–15.0)
MCHC: 33.1 g/dL (ref 30.0–36.0)
MCV: 87.1 fL (ref 78.0–100.0)
Platelets: 310 10*3/uL (ref 150–400)
RBC: 3.42 MIL/uL — ABNORMAL LOW (ref 3.87–5.11)
RBC: 3.36 MIL/uL — ABNORMAL LOW (ref 3.87–5.11)
RDW: 13.8 % (ref 11.5–15.5)
WBC: 10.9 10*3/uL — ABNORMAL HIGH (ref 4.0–10.5)

## 2012-07-18 MED ORDER — OXYCODONE HCL 5 MG PO TABS: 5.0000 mg | ORAL_TABLET | ORAL | Status: AC | PRN

## 2012-07-18 NOTE — Consult Note (Signed)
Reason for Consult: Pubic rami fractures Referring Physician: Dr. Horatio Pel [name] is an 78 y.o. female.  HPI: Delightful 78 yo with a history of right total hip arthroplasty by Dr. Charlann Boxer, who had a mechanical fall on 07/17/2012. She denies and dizziness, LOC or seizure. She just started to fall backwards and landed on her buttocks. She denies hitting her head and her husband states that she was fully mentally aware when he got to her. She was brought to the ER where x-rays revealed fractures of the right pubic rami and probably of the right sacrum. She was admitted for monitoring of Hg/hematoma, PT and pain control.  Dr. Charlann Boxer consulted for further evaluation and treatments.   Past Medical History  Diagnosis Date  . Hypothyroidism   . Hypertension     dr t turner  . Shortness of breath   . H/O hiatal hernia   . Arthritis   . Anxiety   . Neuropathy   . Tremor   . Complication of anesthesia     nigthmares, hallucinations   . Anemia   . Osteoporosis     Past Surgical History  Procedure Date  . Abdominal hysterectomy   . Back surgery     3 back surgeries,   lumbar fusion  . Wrist fracture surgery   . Eye surgery     cateracts  . Falls     variious fall, broken wrist,and toes  . Cardiac catheterization     clean,   7 yrs ago  . Total hip arthroplasty     right    Family History  Problem Relation Age of Onset  . Heart attack Mother     Social History:  reports that she has never smoked. She has never used smokeless tobacco. She reports that she does not drink alcohol or use illicit drugs.  Allergies:  Allergies  Allergen Reactions  . Codeine     Unknown reaction  . Lactose Intolerance (Gi)     Reactions to meds that contain lactose derivitives  . Other Other (See Comments)    Pain meds received after surgery caused nightmares (Percocet,Hydrocodone,Morphine)    Medications: I have reviewed the patient's current medications.  Results for orders placed during  the hospital encounter of 07/17/12 (from the past 48 hour(s))  CBC     Status: Abnormal   Collection Time   07/17/12  2:45 PM      Component Value Range Comment   WBC 18.6 (*) 4.0 - 10.5 K/uL    RBC 4.13  3.87 - 5.11 MIL/uL    Hemoglobin 11.7 (*) 12.0 - 15.0 g/dL    HCT 11.9  14.7 - 82.9 %    MCV 87.9  78.0 - 100.0 fL    MCH 28.3  26.0 - 34.0 pg    MCHC 32.2  30.0 - 36.0 g/dL    RDW 56.2  13.0 - 86.5 %    Platelets 343  150 - 400 K/uL   SAMPLE TO BLOOD BANK     Status: Normal   Collection Time   07/17/12  2:45 PM      Component Value Range Comment   Blood Bank Specimen SAMPLE AVAILABLE FOR TESTING      Sample Expiration 07/20/2012     POCT I-STAT, CHEM 8     Status: Abnormal   Collection Time   07/17/12  3:04 PM      Component Value Range Comment   Sodium 141  135 -  145 mEq/L    Potassium 4.2  3.5 - 5.1 mEq/L    Chloride 106  96 - 112 mEq/L    BUN 13  6 - 23 mg/dL    Creatinine, Ser 1.61  0.50 - 1.10 mg/dL    Glucose, Bld 096 (*) 70 - 99 mg/dL    Calcium, Ion 0.45 (*) 1.13 - 1.30 mmol/L    TCO2 23  0 - 100 mmol/L    Hemoglobin 12.2  12.0 - 15.0 g/dL    HCT 40.9  81.1 - 91.4 %   URINALYSIS, ROUTINE W REFLEX MICROSCOPIC     Status: Normal   Collection Time   07/17/12  5:05 PM      Component Value Range Comment   Color, Urine YELLOW  YELLOW    APPearance CLEAR  CLEAR    Specific Gravity, Urine 1.011  1.005 - 1.030    pH 7.5  5.0 - 8.0    Glucose, UA NEGATIVE  NEGATIVE mg/dL    Hgb urine dipstick NEGATIVE  NEGATIVE    Bilirubin Urine NEGATIVE  NEGATIVE    Ketones, ur NEGATIVE  NEGATIVE mg/dL    Protein, ur NEGATIVE  NEGATIVE mg/dL    Urobilinogen, UA 0.2  0.0 - 1.0 mg/dL    Nitrite NEGATIVE  NEGATIVE    Leukocytes, UA NEGATIVE  NEGATIVE MICROSCOPIC NOT DONE ON URINES WITH NEGATIVE PROTEIN, BLOOD, LEUKOCYTES, NITRITE, OR GLUCOSE <1000 mg/dL.  CBC     Status: Abnormal   Collection Time   07/18/12  3:50 AM      Component Value Range Comment   WBC 10.9 (*) 4.0 - 10.5 K/uL    RBC  3.42 (*) 3.87 - 5.11 MIL/uL    Hemoglobin 9.7 (*) 12.0 - 15.0 g/dL    HCT 78.2 (*) 95.6 - 46.0 %    MCV 87.1  78.0 - 100.0 fL    MCH 28.4  26.0 - 34.0 pg    MCHC 32.6  30.0 - 36.0 g/dL    RDW 21.3  08.6 - 57.8 %    Platelets 310  150 - 400 K/uL     Dg Lumbar Spine Complete  07/17/2012  *RADIOLOGY REPORT*  Clinical Data: Fall.  Back pain and leg pain.  LUMBAR SPINE - COMPLETE 4+ VIEW  Comparison: 07/08/2012 exam from [facility]  Findings: Acute right pubic fractures noted.  Slight irregularity of the peripheral margins of the right sacral arcuate lines raise the possibility of a nondisplaced right sacral fracture.  Posterolateral graft material noted with posterolateral rod pedicle screw fixation at L2 - L3-L4 - L5-S1.  Interbody markers are noted at each level except L3-4.  Grade 1 posterior subluxation of L2-3 and grade 1 anterior subluxation of L4-5 appears similar to 07/08/2012.  No specific signs of hardware failure.  Stable endplate sclerosis and vacuum disc phenomenon with loss of disc height noted at L1-2.  IMPRESSION:  1.  Similar postoperative appearance the lumbar spine. 2.  Right pubic bone fractures and possible right sacral fracture.  Original Report Authenticated By: Dellia Cloud, M.D.   Dg Hip Complete Right  07/17/2012  *RADIOLOGY REPORT*  Clinical Data: Fall.  Pelvic pain.  RIGHT HIP - COMPLETE 2+ VIEW  Comparison: 07/28/2007  Findings: Fracture the right inferior pubic ramus noted with a medial fracture of the right superior pubic ramus.  No definite fracture of the left pubic rami.  I cannot completely exclude a right sacral fracture.  Lower lumbar fixation hardware noted along  with a right total hip prosthesis.  No fracture along the femoral component of the prosthesis is noted.  Vascular calcifications noted.  IMPRESSION:  1.  Fractures of the right pubic rami and probably of the right sacrum. 2.  Atherosclerosis. 3.  Right total hip prosthesis and lower lumbar  fixation hardware.  Original Report Authenticated By: Dellia Cloud, M.D.   Ct Pelvis Wo Contrast  07/17/2012  *RADIOLOGY REPORT*  Clinical Data:  Right groin and hip pain status post fall. Evaluate pelvic fractures.  CT PELVIS WITHOUT CONTRAST  Technique:  Multidetector CT imaging of the pelvis was performed following the standard protocol without intravenous contrast.  Comparison:  Radiographs same date.  Pelvic CT 03/27/2007.  Findings:  As demonstrated radiographically, there are minimally- displaced fractures of the right superior and inferior pubic rami. There is no definite acute sacral fracture.  Mild asymmetry of the sacroiliac joints and adjacent subchondral sclerosis are unchanged from the prior CT.  There is no evidence of acute left-sided pelvic fracture or diastasis at the symphysis pubis.  There are postsurgical changes in the lower lumbar spine status post laminectomy and fusion.  The postsurgical changes are incompletely imaged.  There is no hardware loosening.  Patient is status post right total hip arthroplasty.  The femoral prosthesis is incompletely imaged.  There is no evidence of proximal femur fracture.  There is a large pelvic hematoma associated with the pubic rami fractures with superior extension along the pelvic side wall.  This measures up to 11.7 x 5.0 cm transverse and displaces the urinary bladder towards the left.  Hemorrhage extends anteriorly along the bladder.  No peritoneal fluid or extension into the abdominal retroperitoneum is evident.  IMPRESSION:  1.  Mildly displaced fractures of the right superior and inferior pubic rami. 2.  No evidence of acute sacral fracture or sacroiliac joint diastasis. 3.  Large right pelvic hematoma with bladder displacement to the left.  Original Report Authenticated By: Gerrianne Scale, M.D.    Review of Systems  Constitutional: Negative.   HENT: Negative.   Eyes: Negative.   Respiratory: Negative.   Cardiovascular: Positive  for claudication ("a vein in her leg is blocked").  Gastrointestinal: Negative.   Genitourinary: Negative.   Musculoskeletal: Positive for back pain, joint pain and falls.  Skin: Negative.   Neurological: Positive for tingling (neuropathy of feet). Negative for dizziness, seizures and loss of consciousness.  Endo/Heme/Allergies: Negative.   Psychiatric/Behavioral: Negative.      Blood pressure 101/64, pulse 88, temperature 98.3 F (36.8 C), temperature source Oral, resp. rate 16, height 5' (1.524 m), weight 165 lb (74.844 kg), SpO2 92.00%. Physical Exam  Constitutional: She is oriented to person, place, and time. She appears well-developed and well-nourished.  HENT:  Head: Normocephalic and atraumatic.  Nose: Nose normal.  Mouth/Throat: Oropharynx is clear and moist.  Eyes: Pupils are equal, round, and reactive to light.  Neck: Neck supple. No JVD present. No tracheal deviation present. No thyromegaly present.  Cardiovascular: Normal rate, regular rhythm and intact distal pulses.   Respiratory: Effort normal and breath sounds normal. No respiratory distress. She has no wheezes. She exhibits no tenderness.  GI: Soft. There is no tenderness. There is no guarding.  Musculoskeletal:       Right hip: She exhibits decreased range of motion, decreased strength, tenderness and bony tenderness.       Legs: Neurological: She is alert and oriented to person, place, and time.  Skin: Skin is warm and dry.  Psychiatric: She has a normal mood and affect.    Assessment/Plan: Pubic rami fracture  Pt seen for Dr. Charlann Boxer and x-rays reviewed by Dr. Charlann Boxer   Plan: Orthopaedically stable WBAT with use of walker as needed. PT eval and treatment to assist with ambulating and transfers Sitting donut as needed for comfort with sitting Follow up in 4-6 weeks at [facility] with Dr. Charlann Boxer, call sooner if any questions or concerns  Follow-up Information    Follow up with OLIN,Detria Cummings D in 4-6  weeks.   Contact information:   [facility] [address], [address] [place] [address] [phone]            Gerrit Halls 07/18/2012, 9:53 AM

## 2012-07-18 NOTE — Evaluation (Signed)
Physical Therapy Evaluation Patient Details Name: AVIANNAH CASTORO MRN: [id number] DOB: November 18, 1931 Today's Date: 07/18/2012 Time: 4098-1191 PT Time Calculation (min): 25 min  PT Assessment / Plan / Recommendation Clinical Impression  78 yo female admitted with mildly displaced fracture of R superior, inferior rami. Increased pain with activity. Requiring +2 for mobility. Recommend ST rehab at SNF prior to returning to independent living.     PT Assessment  Patient needs continued PT services    Follow Up Recommendations  Skilled nursing facility    Barriers to Discharge        Equipment Recommendations  None recommended by PT    Recommendations for Other Services OT consult   Frequency 7X/week    Precautions / Restrictions Precautions Precautions: None Restrictions Weight Bearing Restrictions: No RLE Weight Bearing: Weight bearing as tolerated   Pertinent Vitals/Pain      Mobility  Bed Mobility Bed Mobility: Supine to Sit Supine to Sit: 1: +2 Total assist;HOB elevated;With rails Supine to Sit: Patient Percentage: 50% Details for Bed Mobility Assistance: Increased time. Assist for bil LEs off bed and trunk to upright. VCs safety, technique, hand placement.  Transfers Transfers: Sit to Stand;Stand to Sit Sit to Stand: 1: +2 Total assist;With upper extremity assist;From elevated surface;From bed Sit to Stand: Patient Percentage: 60% Stand to Sit: To chair/3-in-1;With upper extremity assist;With armrests;1: +2 Total assist Stand to Sit: Patient Percentage: 60% Details for Transfer Assistance: VCs safety, technique, hand placement. Assist to rise, stabilize, control descent.  Ambulation/Gait Ambulation/Gait Assistance: 1: +2 Total assist Ambulation/Gait: Patient Percentage: 70% Ambulation Distance (Feet): 3 Feet Assistive device: Rolling walker Ambulation/Gait Assistance Details: VCs safety, technique, sequence, step length. Increased time. Fatigues easily. Recliner  brought up behind pt to sit. Gait Pattern: Decreased stance time - right;Step-to pattern;Decreased weight shift to right;Decreased stride length;Decreased step length - right;Decreased step length - left;Antalgic    Exercises     PT Diagnosis: Difficulty walking;Abnormality of gait;Acute pain  PT Problem List: Decreased range of motion;Decreased activity tolerance;Decreased mobility;Pain;Decreased knowledge of use of DME PT Treatment Interventions: DME instruction;Gait training;Functional mobility training;Therapeutic activities;Therapeutic exercise;Balance training;Patient/family education   PT Goals Acute Rehab PT Goals PT Goal Formulation: With patient/family Time For Goal Achievement: 08/01/12 Potential to Achieve Goals: Good Pt will go Supine/Side to Sit: with min assist PT Goal: Supine/Side to Sit - Progress: Goal set today Pt will go Sit to Supine/Side: with min assist PT Goal: Sit to Supine/Side - Progress: Goal set today Pt will go Sit to Stand: with min assist PT Goal: Sit to Stand - Progress: Goal set today Pt will go Stand to Sit: with min assist PT Goal: Stand to Sit - Progress: Goal set today Pt will Transfer Bed to Chair/Chair to Bed: with min assist PT Transfer Goal: Bed to Chair/Chair to Bed - Progress: Goal set today Pt will Ambulate: 51 - 150 feet;with min assist;with rolling walker PT Goal: Ambulate - Progress: Goal set today  Visit Information  Last PT Received On: 07/18/12 Assistance Needed: +2    Subjective Data  Subjective: "I don't know how this will go" Patient Stated Goal: Less pain. Home   Prior Functioning  Home Living Lives With: Spouse Available Help at Discharge: Family Type of Home: Independent living facility ([facility]) Home Access: Level entry Home Layout: One level Home Adaptive Equipment: Walker - rolling;Bedside commode/3-in-1;Straight cane (adjustable bed, reacher) Prior Function Level of Independence:  Independent Communication Communication: No difficulties    Cognition  Overall Cognitive Status: Appears  within functional limits for tasks assessed/performed Arousal/Alertness: Awake/alert Orientation Level: Appears intact for tasks assessed Behavior During Session: [facility] for tasks performed    Extremity/Trunk Assessment Right Lower Extremity Assessment RLE ROM/Strength/Tone: Unable to fully assess;Due to pain;Deficits RLE ROM/Strength/Tone Deficits: Pt able to weightbear.  RLE Sensation: WFL - Light Touch Left Lower Extremity Assessment LLE ROM/Strength/Tone: WFL for tasks assessed LLE Sensation: WFL - Light Touch Trunk Assessment Trunk Assessment: Normal   Balance    End of Session PT - End of Session Equipment Utilized During Treatment: Gait belt Activity Tolerance: Patient limited by pain Patient left: in chair;with call bell/phone within reach  GP Functional Assessment Tool Used: Clinical judgement Functional Limitation: Mobility: Walking and moving around Mobility: Walking and Moving Around Current Status ([id number]): At least 40 percent but less than 60 percent impaired, limited or restricted Mobility: Walking and Moving Around Goal Status [phone]): At least 20 percent but less than 40 percent impaired, limited or restricted   Rebeca Alert [facility] 07/18/2012, 10:23 AM [phone]

## 2012-07-18 NOTE — Progress Notes (Signed)
CSW assisting with d/c planning. Pt is from [facility] ( [facility] ) and ready for d/c today. PT has recommended SNF placement. Pt /family are in agreement with plan. [facility] has an opening on rehab unit and will admit today. Pt/family are aware medicare will not cover SNF placement ( no 3 day qualifying hospital stay ). Pt will return to [facility] via [facility].  Cori Razor LCSW [phone]

## 2012-07-18 NOTE — Discharge Summary (Signed)
Physician Discharge Summary  JESSYCA SLOAN WUJ:[id number] DOB: December 03, 1931 DOA: 07/17/2012  PCP: Allean Found, MD  Admit date: 07/17/2012 Discharge date: 07/18/2012  Recommendations for Outpatient Follow-up:  1. Follow up recommendations as indicated below.  Please make sure that patient's pain is well controlled on current regimen.  Discharge Diagnoses:  Active Problems:  Fracture Of Multiple Pubic Rami  Hematoma of hip  Hypertension  Leukocytosis   Discharge Condition: Stable  Diet recommendation: Regular diet  Wt Readings from Last 3 Encounters:  07/18/12 74.844 kg (165 lb)  05/22/12 74.844 kg (165 lb)  03/22/12 75.297 kg (166 lb)    History of present illness:  Patient is a 78 y/o that presented after mechanical fall and was found to have multiple pubic rami fx's of right side.  Reports as indicated below.  Also had hematoma. Was admitted for further observation and further recommendations from Physical therapy and orthopaedic surgeons.  Hospital Course:  1. Fracture of Multiple pubic Rami - At this point will manage pain on current regimen as patient has tolerated well. - Order physical therapy to evaluate and treat patient.  Will recommend continued physical therapy sessions on discharge at ALF facility - Secondary to fall but complicated by history of osteoporosis.   2. Leukocytosis  - Likely reactive given recent history of fall  - There are no sources of infection  - Resolved off of antibiotics  3. Hematoma  - Secondary to recent fall  - CBC rechecked and steady at 9.8 today - Hold aspirin upon discharge  4. HTN  - blood pressures initially on low side and as such this may have contributed to her fall.  As such will plan on holding patient's beta blocker but will continue her diltiazem.  5. Osteoporosis  - Likely contributing to # 1. Will plan on continuing home regimen.   6. Hypothyroidism  - Continue synthroid. Currently stable.     Code  Status: Full  Family Communication: Pt and husband at bedside  Disposition Plan: Pending serial h/h's and pain control. May d/c in 1-2 days   Procedures:  None  Consultations:  Orthopaedic surgeons  Discharge Exam: Filed Vitals:   07/18/12 0956  BP: 103/64  Pulse:   Temp:   Resp:    Filed Vitals:   07/18/12 0005 07/18/12 0141 07/18/12 0600 07/18/12 0956  BP: 98/56  101/64 103/64  Pulse:   88   Temp:   98.3 F (36.8 C)   TempSrc:   Oral   Resp:   16   Height:  5' (1.524 m)    Weight:  74.844 kg (165 lb)    SpO2:   92%     General: Pt in NAD, A and O x 3 Cardiovascular: RRR, No MRG Respiratory: CTA BL, no wheezes  Discharge Instructions  Discharge Orders    Future Appointments: Provider: Department: Dept Phone: Center:   11/20/2012 10:00 AM [facility] [phone] [facility]   11/20/2012 10:40 AM Evern Bio, NP [facility] [phone] [facility]     Future Orders Please Complete By Expires   Diet - low sodium heart healthy      Increase activity slowly      Discharge instructions      Comments:   Follow up with OLIN,MATTHEW D in 4-6 weeks.     Contact information:     [facility]  [address], [address]  WBAT with use of walker as needed. PT eval and treatment to assist with  ambulating and transfers Sitting donut as needed for comfort with sitting Follow up in 4-6 weeks at [facility] with Dr. Charlann Boxer, call sooner if any questions or concerns  [facility] [address] [phone]   Call MD for:  temperature >100.4      Call MD for:  redness, tenderness, or signs of infection (pain, swelling, redness, odor or green/yellow discharge around incision site)      Call MD for:  persistant dizziness or light-headedness        Medication List  As of 07/18/2012  1:07 PM   STOP taking these medications         aspirin EC 81 MG tablet      metoprolol succinate 50 MG 24 hr tablet         TAKE these  medications         CALCIUM 500 PO   Take 1,000 mg by mouth daily.      DEXILANT 60 MG capsule   Generic drug: dexlansoprazole   Take 60 mg by mouth daily.      diltiazem 120 MG 24 hr capsule   Commonly known as: TIAZAC   Take 120 mg by mouth daily.      docusate sodium 100 MG capsule   Commonly known as: COLACE   Take 100 mg by mouth at bedtime.      ezetimibe 10 MG tablet   Commonly known as: ZETIA   Take 10 mg by mouth every evening.      FORTEO 600 MCG/2.4ML Soln   Generic drug: Teriparatide (Recombinant)   Inject 20 mcg into the skin every morning.      levothyroxine 50 MCG tablet   Commonly known as: SYNTHROID, LEVOTHROID   Take 50 mcg by mouth daily.      multivitamin with minerals Tabs   Take 1 tablet by mouth daily.      oxyCODONE 5 MG immediate release tablet   Commonly known as: Oxy IR/ROXICODONE   Take 1 tablet (5 mg total) by mouth every 4 (four) hours as needed for pain.      polyethylene glycol packet   Commonly known as: MIRALAX / GLYCOLAX   Take 17 g by mouth daily.      pramipexole 1 MG tablet   Commonly known as: MIRAPEX   Take 1 mg by mouth 4 (four) times daily.      pravastatin 80 MG tablet   Commonly known as: PRAVACHOL   Take 80 mg by mouth every evening.      traMADol 50 MG tablet   Commonly known as: ULTRAM   Take 50-100 mg by mouth every 8 (eight) hours as needed. For pain      Vitamin D 2000 UNITS tablet   Take 2,000 Units by mouth daily.              The results of significant diagnostics from this hospitalization (including imaging, microbiology, ancillary and laboratory) are listed below for reference.    Significant Diagnostic Studies: Dg Lumbar Spine Complete  07/17/2012  *RADIOLOGY REPORT*  Clinical Data: Fall.  Back pain and leg pain.  LUMBAR SPINE - COMPLETE 4+ VIEW  Comparison: 07/08/2012 exam from [facility]  Findings: Acute right pubic fractures noted.  Slight irregularity of the peripheral margins of the  right sacral arcuate lines raise the possibility of a nondisplaced right sacral fracture.  Posterolateral graft material noted with posterolateral rod pedicle screw fixation at L2 - L3-L4 - L5-S1.  Interbody markers  are noted at each level except L3-4.  Grade 1 posterior subluxation of L2-3 and grade 1 anterior subluxation of L4-5 appears similar to 07/08/2012.  No specific signs of hardware failure.  Stable endplate sclerosis and vacuum disc phenomenon with loss of disc height noted at L1-2.  IMPRESSION:  1.  Similar postoperative appearance the lumbar spine. 2.  Right pubic bone fractures and possible right sacral fracture.  Original Report Authenticated By: Dellia Cloud, M.D.   Dg Hip Complete Right  07/17/2012  *RADIOLOGY REPORT*  Clinical Data: Fall.  Pelvic pain.  RIGHT HIP - COMPLETE 2+ VIEW  Comparison: 07/28/2007  Findings: Fracture the right inferior pubic ramus noted with a medial fracture of the right superior pubic ramus.  No definite fracture of the left pubic rami.  I cannot completely exclude a right sacral fracture.  Lower lumbar fixation hardware noted along with a right total hip prosthesis.  No fracture along the femoral component of the prosthesis is noted.  Vascular calcifications noted.  IMPRESSION:  1.  Fractures of the right pubic rami and probably of the right sacrum. 2.  Atherosclerosis. 3.  Right total hip prosthesis and lower lumbar fixation hardware.  Original Report Authenticated By: Dellia Cloud, M.D.   Ct Pelvis Wo Contrast  07/17/2012  *RADIOLOGY REPORT*  Clinical Data:  Right groin and hip pain status post fall. Evaluate pelvic fractures.  CT PELVIS WITHOUT CONTRAST  Technique:  Multidetector CT imaging of the pelvis was performed following the standard protocol without intravenous contrast.  Comparison:  Radiographs same date.  Pelvic CT 03/27/2007.  Findings:  As demonstrated radiographically, there are minimally- displaced fractures of the right superior and  inferior pubic rami. There is no definite acute sacral fracture.  Mild asymmetry of the sacroiliac joints and adjacent subchondral sclerosis are unchanged from the prior CT.  There is no evidence of acute left-sided pelvic fracture or diastasis at the symphysis pubis.  There are postsurgical changes in the lower lumbar spine status post laminectomy and fusion.  The postsurgical changes are incompletely imaged.  There is no hardware loosening.  Patient is status post right total hip arthroplasty.  The femoral prosthesis is incompletely imaged.  There is no evidence of proximal femur fracture.  There is a large pelvic hematoma associated with the pubic rami fractures with superior extension along the pelvic side wall.  This measures up to 11.7 x 5.0 cm transverse and displaces the urinary bladder towards the left.  Hemorrhage extends anteriorly along the bladder.  No peritoneal fluid or extension into the abdominal retroperitoneum is evident.  IMPRESSION:  1.  Mildly displaced fractures of the right superior and inferior pubic rami. 2.  No evidence of acute sacral fracture or sacroiliac joint diastasis. 3.  Large right pelvic hematoma with bladder displacement to the left.  Original Report Authenticated By: Gerrianne Scale, M.D.   Mm Digital Screening  07/10/2012  *RADIOLOGY REPORT*  Clinical Data: Screening.  DIGITAL SCREENING MAMMOGRAM WITH CAD  Comparison:  Previous exams  Findings:  There are scattered fibroglandular densities. No suspicious masses, architectural distortion, or calcifications are present.  Images were processed with CAD.  IMPRESSION: No mammographic evidence of malignancy.  A result letter of this screening mammogram will be mailed directly to the patient.  RECOMMENDATION: Screening mammogram in one year. (Code:SM-B-01Y)  BI-RADS CATEGORY 1:  Negative  Original Report Authenticated By: Harrel Lemon, M.D.    Microbiology: No results found for this or any previous visit (from the  past  240 hour(s)).   Labs: Basic Metabolic Panel:  Lab 07/17/12 8295  NA 141  K 4.2  CL 106  CO2 --  GLUCOSE 117*  BUN 13  CREATININE 0.70  CALCIUM --  MG --  PHOS --   Liver Function Tests: No results found for this basename: AST:5,ALT:5,ALKPHOS:5,BILITOT:5,PROT:5,ALBUMIN:5 in the last 168 hours No results found for this basename: LIPASE:5,AMYLASE:5 in the last 168 hours No results found for this basename: AMMONIA:5 in the last 168 hours CBC:  Lab 07/18/12 1130 07/18/12 0350 07/17/12 1504 07/17/12 1445  WBC 9.9 10.9* -- 18.6*  NEUTROABS -- -- -- --  HGB 9.8* 9.7* 12.2 11.7*  HCT 29.6* 29.8* 36.0 36.3  MCV 88.1 87.1 -- 87.9  PLT 289 310 -- 343   Cardiac Enzymes: No results found for this basename: CKTOTAL:5,CKMB:5,CKMBINDEX:5,TROPONINI:5 in the last 168 hours BNP: BNP (last 3 results) No results found for this basename: PROBNP:3 in the last 8760 hours CBG: No results found for this basename: GLUCAP:5 in the last 168 hours  Time coordinating discharge: >35 minutes  Signed:  Penny Pia  [facility] 07/18/2012, 1:07 PM

## 2012-07-18 NOTE — Care Management Note (Signed)
    Page 1 of 2   07/18/2012     5:42:45 PM   CARE MANAGEMENT NOTE 07/18/2012  Patient:  [name], [name]   Account Number:  [id number]  Date Initiated:  07/18/2012  Documentation initiated by:  Colleen Can  Subjective/Objective Assessment:   dx fall sustaining mildly displaced fractures of rt superior and inferior pubic rami     Action/Plan:   SNF rehab   Anticipated DC Date:  07/18/2012   Anticipated DC Plan:  SKILLED NURSING FACILITY  In-house referral  Clinical Social Worker      DC Planning Services  NA      [facility] Choice  NA   Choice offered to / List presented to:  NA   DME arranged  NA      DME agency  NA     HH arranged  NA      HH agency  NA   Status of service:  Completed, signed off Medicare Important Message given?  NA - LOS <3 / Initial given by admissions (If response is "NO", the following Medicare IM given date fields will be blank) Date Medicare IM given:   Date Additional Medicare IM given:    Discharge Disposition:  SKILLED NURSING FACILITY  Per UR Regulation:  Reviewed for med. necessity/level of care/duration of stay  If discussed at Long Length of Stay Meetings, dates discussed:    Comments:

## 2012-07-21 DIAGNOSIS — S7000XA Contusion of unspecified hip, initial encounter: Secondary | ICD-10-CM | POA: Diagnosis not present

## 2012-07-21 DIAGNOSIS — S329XXA Fracture of unspecified parts of lumbosacral spine and pelvis, initial encounter for closed fracture: Secondary | ICD-10-CM | POA: Diagnosis not present

## 2012-07-21 DIAGNOSIS — M545 Low back pain: Secondary | ICD-10-CM | POA: Diagnosis not present

## 2012-07-21 DIAGNOSIS — D649 Anemia, unspecified: Secondary | ICD-10-CM | POA: Diagnosis not present

## 2012-07-21 DIAGNOSIS — I10 Essential (primary) hypertension: Secondary | ICD-10-CM | POA: Diagnosis not present

## 2012-07-21 DIAGNOSIS — E559 Vitamin D deficiency, unspecified: Secondary | ICD-10-CM | POA: Diagnosis not present

## 2012-07-22 DIAGNOSIS — R609 Edema, unspecified: Secondary | ICD-10-CM | POA: Diagnosis not present

## 2012-07-22 DIAGNOSIS — M25559 Pain in unspecified hip: Secondary | ICD-10-CM | POA: Diagnosis not present

## 2012-07-22 DIAGNOSIS — S329XXA Fracture of unspecified parts of lumbosacral spine and pelvis, initial encounter for closed fracture: Secondary | ICD-10-CM | POA: Diagnosis not present

## 2012-07-22 DIAGNOSIS — I10 Essential (primary) hypertension: Secondary | ICD-10-CM | POA: Diagnosis not present

## 2012-07-22 DIAGNOSIS — D649 Anemia, unspecified: Secondary | ICD-10-CM | POA: Diagnosis not present

## 2012-07-22 DIAGNOSIS — S7000XA Contusion of unspecified hip, initial encounter: Secondary | ICD-10-CM | POA: Diagnosis not present

## 2012-07-23 DIAGNOSIS — M810 Age-related osteoporosis without current pathological fracture: Secondary | ICD-10-CM | POA: Diagnosis not present

## 2012-07-23 DIAGNOSIS — D638 Anemia in other chronic diseases classified elsewhere: Secondary | ICD-10-CM | POA: Diagnosis not present

## 2012-07-23 DIAGNOSIS — D649 Anemia, unspecified: Secondary | ICD-10-CM | POA: Diagnosis not present

## 2012-07-23 DIAGNOSIS — R29898 Other symptoms and signs involving the musculoskeletal system: Secondary | ICD-10-CM | POA: Diagnosis not present

## 2012-07-24 DIAGNOSIS — D649 Anemia, unspecified: Secondary | ICD-10-CM | POA: Diagnosis not present

## 2012-07-25 DIAGNOSIS — D649 Anemia, unspecified: Secondary | ICD-10-CM | POA: Diagnosis not present

## 2012-07-26 DIAGNOSIS — D649 Anemia, unspecified: Secondary | ICD-10-CM | POA: Diagnosis not present

## 2012-07-28 DIAGNOSIS — D649 Anemia, unspecified: Secondary | ICD-10-CM | POA: Diagnosis not present

## 2012-07-29 DIAGNOSIS — D649 Anemia, unspecified: Secondary | ICD-10-CM | POA: Diagnosis not present

## 2012-07-30 DIAGNOSIS — I10 Essential (primary) hypertension: Secondary | ICD-10-CM | POA: Diagnosis not present

## 2012-07-30 DIAGNOSIS — E039 Hypothyroidism, unspecified: Secondary | ICD-10-CM | POA: Diagnosis not present

## 2012-07-30 DIAGNOSIS — M7989 Other specified soft tissue disorders: Secondary | ICD-10-CM | POA: Diagnosis not present

## 2012-07-30 DIAGNOSIS — E785 Hyperlipidemia, unspecified: Secondary | ICD-10-CM | POA: Diagnosis not present

## 2012-07-30 DIAGNOSIS — R000 Tachycardia, unspecified: Secondary | ICD-10-CM | POA: Diagnosis not present

## 2012-07-30 DIAGNOSIS — R0602 Shortness of breath: Secondary | ICD-10-CM | POA: Diagnosis not present

## 2012-07-30 DIAGNOSIS — R300 Dysuria: Secondary | ICD-10-CM | POA: Diagnosis not present

## 2012-07-30 DIAGNOSIS — R609 Edema, unspecified: Secondary | ICD-10-CM | POA: Diagnosis not present

## 2012-07-31 DIAGNOSIS — E785 Hyperlipidemia, unspecified: Secondary | ICD-10-CM | POA: Diagnosis not present

## 2012-07-31 DIAGNOSIS — E039 Hypothyroidism, unspecified: Secondary | ICD-10-CM | POA: Diagnosis not present

## 2012-07-31 DIAGNOSIS — N390 Urinary tract infection, site not specified: Secondary | ICD-10-CM | POA: Diagnosis not present

## 2012-08-06 DIAGNOSIS — R000 Tachycardia, unspecified: Secondary | ICD-10-CM | POA: Diagnosis not present

## 2012-08-06 DIAGNOSIS — G250 Essential tremor: Secondary | ICD-10-CM | POA: Diagnosis not present

## 2012-08-06 DIAGNOSIS — I10 Essential (primary) hypertension: Secondary | ICD-10-CM | POA: Diagnosis not present

## 2012-08-06 DIAGNOSIS — R609 Edema, unspecified: Secondary | ICD-10-CM | POA: Diagnosis not present

## 2012-08-06 DIAGNOSIS — M25559 Pain in unspecified hip: Secondary | ICD-10-CM | POA: Diagnosis not present

## 2012-08-06 DIAGNOSIS — D649 Anemia, unspecified: Secondary | ICD-10-CM | POA: Diagnosis not present

## 2012-08-07 DIAGNOSIS — D649 Anemia, unspecified: Secondary | ICD-10-CM | POA: Diagnosis not present

## 2012-08-08 DIAGNOSIS — R6889 Other general symptoms and signs: Secondary | ICD-10-CM | POA: Diagnosis not present

## 2012-08-11 DIAGNOSIS — R279 Unspecified lack of coordination: Secondary | ICD-10-CM | POA: Diagnosis not present

## 2012-08-11 DIAGNOSIS — R269 Unspecified abnormalities of gait and mobility: Secondary | ICD-10-CM | POA: Diagnosis not present

## 2012-08-12 DIAGNOSIS — R279 Unspecified lack of coordination: Secondary | ICD-10-CM | POA: Diagnosis not present

## 2012-08-12 DIAGNOSIS — R269 Unspecified abnormalities of gait and mobility: Secondary | ICD-10-CM | POA: Diagnosis not present

## 2012-08-12 DIAGNOSIS — I10 Essential (primary) hypertension: Secondary | ICD-10-CM | POA: Diagnosis not present

## 2012-08-13 DIAGNOSIS — R609 Edema, unspecified: Secondary | ICD-10-CM | POA: Diagnosis not present

## 2012-08-13 DIAGNOSIS — I509 Heart failure, unspecified: Secondary | ICD-10-CM | POA: Diagnosis not present

## 2012-08-13 DIAGNOSIS — D649 Anemia, unspecified: Secondary | ICD-10-CM | POA: Diagnosis not present

## 2012-08-13 DIAGNOSIS — I10 Essential (primary) hypertension: Secondary | ICD-10-CM | POA: Diagnosis not present

## 2012-08-13 DIAGNOSIS — R279 Unspecified lack of coordination: Secondary | ICD-10-CM | POA: Diagnosis not present

## 2012-08-13 DIAGNOSIS — R269 Unspecified abnormalities of gait and mobility: Secondary | ICD-10-CM | POA: Diagnosis not present

## 2012-08-13 DIAGNOSIS — R000 Tachycardia, unspecified: Secondary | ICD-10-CM | POA: Diagnosis not present

## 2012-08-14 DIAGNOSIS — R269 Unspecified abnormalities of gait and mobility: Secondary | ICD-10-CM | POA: Diagnosis not present

## 2012-08-14 DIAGNOSIS — R279 Unspecified lack of coordination: Secondary | ICD-10-CM | POA: Diagnosis not present

## 2012-08-18 DIAGNOSIS — R269 Unspecified abnormalities of gait and mobility: Secondary | ICD-10-CM | POA: Diagnosis not present

## 2012-08-18 DIAGNOSIS — M6281 Muscle weakness (generalized): Secondary | ICD-10-CM | POA: Diagnosis not present

## 2012-08-20 DIAGNOSIS — M6281 Muscle weakness (generalized): Secondary | ICD-10-CM | POA: Diagnosis not present

## 2012-08-20 DIAGNOSIS — R269 Unspecified abnormalities of gait and mobility: Secondary | ICD-10-CM | POA: Diagnosis not present

## 2012-08-21 DIAGNOSIS — R269 Unspecified abnormalities of gait and mobility: Secondary | ICD-10-CM | POA: Diagnosis not present

## 2012-08-21 DIAGNOSIS — M6281 Muscle weakness (generalized): Secondary | ICD-10-CM | POA: Diagnosis not present

## 2012-08-22 DIAGNOSIS — S32509A Unspecified fracture of unspecified pubis, initial encounter for closed fracture: Secondary | ICD-10-CM | POA: Diagnosis not present

## 2012-08-25 DIAGNOSIS — R269 Unspecified abnormalities of gait and mobility: Secondary | ICD-10-CM | POA: Diagnosis not present

## 2012-08-25 DIAGNOSIS — M6281 Muscle weakness (generalized): Secondary | ICD-10-CM | POA: Diagnosis not present

## 2012-08-26 DIAGNOSIS — I5032 Chronic diastolic (congestive) heart failure: Secondary | ICD-10-CM | POA: Diagnosis not present

## 2012-08-26 DIAGNOSIS — R269 Unspecified abnormalities of gait and mobility: Secondary | ICD-10-CM | POA: Diagnosis not present

## 2012-08-26 DIAGNOSIS — I119 Hypertensive heart disease without heart failure: Secondary | ICD-10-CM | POA: Diagnosis not present

## 2012-08-26 DIAGNOSIS — M6281 Muscle weakness (generalized): Secondary | ICD-10-CM | POA: Diagnosis not present

## 2012-08-26 DIAGNOSIS — R0602 Shortness of breath: Secondary | ICD-10-CM | POA: Diagnosis not present

## 2012-08-27 DIAGNOSIS — M79609 Pain in unspecified limb: Secondary | ICD-10-CM | POA: Diagnosis not present

## 2012-08-27 DIAGNOSIS — I5032 Chronic diastolic (congestive) heart failure: Secondary | ICD-10-CM | POA: Diagnosis not present

## 2012-08-27 DIAGNOSIS — Z23 Encounter for immunization: Secondary | ICD-10-CM | POA: Diagnosis not present

## 2012-08-28 DIAGNOSIS — R269 Unspecified abnormalities of gait and mobility: Secondary | ICD-10-CM | POA: Diagnosis not present

## 2012-08-28 DIAGNOSIS — M6281 Muscle weakness (generalized): Secondary | ICD-10-CM | POA: Diagnosis not present

## 2012-09-01 DIAGNOSIS — R269 Unspecified abnormalities of gait and mobility: Secondary | ICD-10-CM | POA: Diagnosis not present

## 2012-09-01 DIAGNOSIS — M6281 Muscle weakness (generalized): Secondary | ICD-10-CM | POA: Diagnosis not present

## 2012-09-02 DIAGNOSIS — R269 Unspecified abnormalities of gait and mobility: Secondary | ICD-10-CM | POA: Diagnosis not present

## 2012-09-02 DIAGNOSIS — R0602 Shortness of breath: Secondary | ICD-10-CM | POA: Diagnosis not present

## 2012-09-02 DIAGNOSIS — Z79899 Other long term (current) drug therapy: Secondary | ICD-10-CM | POA: Diagnosis not present

## 2012-09-02 DIAGNOSIS — M6281 Muscle weakness (generalized): Secondary | ICD-10-CM | POA: Diagnosis not present

## 2012-09-02 DIAGNOSIS — I5032 Chronic diastolic (congestive) heart failure: Secondary | ICD-10-CM | POA: Diagnosis not present

## 2012-09-17 DIAGNOSIS — E669 Obesity, unspecified: Secondary | ICD-10-CM | POA: Diagnosis not present

## 2012-09-17 DIAGNOSIS — I119 Hypertensive heart disease without heart failure: Secondary | ICD-10-CM | POA: Diagnosis not present

## 2012-09-17 DIAGNOSIS — I5032 Chronic diastolic (congestive) heart failure: Secondary | ICD-10-CM | POA: Diagnosis not present

## 2012-10-03 DIAGNOSIS — S32509A Unspecified fracture of unspecified pubis, initial encounter for closed fracture: Secondary | ICD-10-CM | POA: Diagnosis not present

## 2012-10-06 DIAGNOSIS — G20 Parkinson's disease: Secondary | ICD-10-CM | POA: Diagnosis not present

## 2012-10-06 DIAGNOSIS — G894 Chronic pain syndrome: Secondary | ICD-10-CM | POA: Diagnosis not present

## 2012-10-06 DIAGNOSIS — M545 Low back pain: Secondary | ICD-10-CM | POA: Diagnosis not present

## 2012-10-06 DIAGNOSIS — M48061 Spinal stenosis, lumbar region without neurogenic claudication: Secondary | ICD-10-CM | POA: Diagnosis not present

## 2012-10-06 DIAGNOSIS — M1990 Unspecified osteoarthritis, unspecified site: Secondary | ICD-10-CM | POA: Diagnosis not present

## 2012-10-06 DIAGNOSIS — M3500 Sicca syndrome, unspecified: Secondary | ICD-10-CM | POA: Diagnosis not present

## 2012-11-10 DIAGNOSIS — M48061 Spinal stenosis, lumbar region without neurogenic claudication: Secondary | ICD-10-CM | POA: Diagnosis not present

## 2012-11-11 DIAGNOSIS — M79609 Pain in unspecified limb: Secondary | ICD-10-CM | POA: Diagnosis not present

## 2012-11-19 ENCOUNTER — Encounter: Payer: Self-pay | Admitting: Neurosurgery

## 2012-11-20 ENCOUNTER — Ambulatory Visit (INDEPENDENT_AMBULATORY_CARE_PROVIDER_SITE_OTHER): Payer: Medicare Other | Admitting: Neurosurgery

## 2012-11-20 ENCOUNTER — Encounter: Payer: Self-pay | Admitting: Neurosurgery

## 2012-11-20 ENCOUNTER — Encounter (INDEPENDENT_AMBULATORY_CARE_PROVIDER_SITE_OTHER): Payer: Medicare Other | Admitting: *Deleted

## 2012-11-20 VITALS — BP 128/66 | HR 71 | Resp 16 | Ht 60.0 in | Wt 160.5 lb

## 2012-11-20 DIAGNOSIS — I70219 Atherosclerosis of native arteries of extremities with intermittent claudication, unspecified extremity: Secondary | ICD-10-CM | POA: Diagnosis not present

## 2012-11-20 DIAGNOSIS — M7989 Other specified soft tissue disorders: Secondary | ICD-10-CM | POA: Diagnosis not present

## 2012-11-20 DIAGNOSIS — M25471 Effusion, right ankle: Secondary | ICD-10-CM

## 2012-11-20 NOTE — Progress Notes (Signed)
[facility]PVD Office Note  CC: PVD surveillance Referring Physician: Fields  History of Present Illness: 78 year old female patient of Dr. Darrick Penna followed for evaluation lower extremity edema as well as mild claudication. The patient states she is now a diuretic to her primary care physician which is helped the swelling quite a bit. 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 The patient denies rest pain or open ulcerations. The patient does state she is going to have to have the toe nail removed on her left great toe.  Past Medical History  Diagnosis Date  . Hypothyroidism   . Hypertension     dr t turner  . Shortness of breath   . H/O hiatal hernia   . Arthritis   . Anxiety   . Neuropathy   . Tremor   . Complication of anesthesia     nigthmares, hallucinations   . Anemia   . Osteoporosis   . CHF (congestive heart failure)     ROS: [x]  Positive   [ ]  Denies    General: [ ]  Weight loss, [ ]  Fever, [ ]  chills Neurologic: [ ]  Dizziness, [ ]  Blackouts, [ ]  Seizure [ ]  Stroke, [ ]  "Mini stroke", [ ]  Slurred speech, [ ]  Temporary blindness; [ ]  weakness in arms or legs, [ ]  Hoarseness Cardiac: [ ]  Chest pain/pressure, [ ]  Shortness of breath at rest [ ]  Shortness of breath with exertion, [ ]  Atrial fibrillation or irregular heartbeat Vascular: [ ]  Pain in legs with walking, [ ]  Pain in legs at rest, [ ]  Pain in legs at night,  [ ]  Non-healing ulcer, [ ]  Blood clot in vein/DVT,   Pulmonary: [ ]  Home oxygen, [ ]  Productive cough, [ ]  Coughing up blood, [ ]  Asthma,  [ ]  Wheezing Musculoskeletal:  [ ]  Arthritis, [ ]  Low back pain, [ ]  Joint pain Hematologic: [ ]  Easy Bruising, [ ]  Anemia; [ ]  Hepatitis Gastrointestinal: [ ]  Blood in stool, [ ]  Gastroesophageal Reflux/heartburn, [ ]  Trouble swallowing Urinary: [ ]  chronic  Kidney disease, [ ]  on HD - [ ]  MWF or [ ]  TTHS, [ ]  Burning with urination, [ ]  Difficulty urinating Skin: [ ]  Rashes, [ ]  Wounds Psychological: [ ]  Anxiety, [ ]  Depression   Social History History  Substance Use Topics  . Smoking status: Never Smoker   . Smokeless tobacco: Never Used  . Alcohol Use: No    Family History Family History  Problem Relation Age of Onset  . Heart attack Mother   . COPD Father     Allergies  Allergen Reactions  . Codeine     Unknown reaction  . Lactose Intolerance (Gi)     Reactions to meds that contain lactose derivitives  . Other Other (See Comments)    Pain meds received after surgery caused nightmares (Percocet,Hydrocodone,Morphine)    Current Outpatient Prescriptions  Medication Sig Dispense Refill  . Calcium Carbonate (CALCIUM 500 PO) Take 1,000 mg by mouth daily.      . carbidopa-levodopa (SINEMET IR) 25-100 MG per tablet 3 (three) times daily.       . Cholecalciferol (VITAMIN D) 2000 UNITS tablet Take 2,000 Units by mouth daily.      Marland Kitchen dexlansoprazole (DEXILANT) 60 MG capsule Take 60 mg by mouth daily.      Marland Kitchen diltiazem (CARDIZEM  CD) 120 MG 24 hr capsule       . docusate sodium (COLACE) 100 MG capsule Take 100 mg by mouth at bedtime.      Marland Kitchen ezetimibe (ZETIA) 10 MG tablet Take 10 mg by mouth every evening.      . furosemide (LASIX) 20 MG tablet       . levothyroxine (SYNTHROID, LEVOTHROID) 50 MCG tablet Take 50 mcg by mouth daily.      . Multiple Vitamin (MULITIVITAMIN WITH MINERALS) TABS Take 1 tablet by mouth daily.      . polyethylene glycol (MIRALAX / GLYCOLAX) packet Take 17 g by mouth daily.      . potassium chloride (K-DUR) 10 MEQ tablet       . pramipexole (MIRAPEX) 1 MG tablet Take 1 mg by mouth 3 (three) times daily.       . Teriparatide, Recombinant, (FORTEO) 600 MCG/2.4ML SOLN Inject 20 mcg into the skin every morning.      . traMADol (ULTRAM) 50 MG tablet Take 100 mg by mouth daily. For pain      . diltiazem (TIAZAC) 120  MG 24 hr capsule Take 120 mg by mouth daily.       . pravastatin (PRAVACHOL) 80 MG tablet Take 80 mg by mouth every evening.        Physical Examination  Filed Vitals:   11/20/12 1053  BP: 128/66  Pulse: 71  Resp: 16    Body mass index is 31.35 kg/(m^2).  General:  WDWN in NAD Gait: Normal HEENT: WNL Eyes: Pupils equal Pulmonary: normal non-labored breathing , without Rales, rhonchi,  wheezing Cardiac: RRR, without  Murmurs, rubs or gallops; No carotid bruits Abdomen: soft, NT, no masses Skin: no rashes, ulcers noted Vascular Exam/Pulses: Palpable femoral pulses bilaterally lower extremity pulses are dampened bilaterally she does have a palpable dorsalis pedis  Extremities without ischemic changes, no Gangrene , no cellulitis; no open wounds;  Musculoskeletal: no muscle wasting or atrophy  Neurologic: A&O X 3; Appropriate Affect ; SENSATION: normal; MOTOR FUNCTION:  moving all extremities equally. Speech is fluent/normal  Non-Invasive Vascular Imaging: ABI on the right today is 1.3 to the left is noncompressible however she does have toe pressures of 0.95 on the right and 0.89 on the left with biphasic flow  ASSESSMENT/PLAN: This is a patient with mild intermittent claudication that prefers to continue her walking program. The patient most no diagnostics at this time. The patient will followup in one year with repeat ABIs she knows to call the office in the interim if she has difficulty. The patient's questions were encouraged and answered.  Lauree Chandler ANP  Clinic M.D.: [facility]

## 2012-11-20 NOTE — Addendum Note (Signed)
Addended by: Sharee Pimple on: 11/20/2012 01:30 PM   Modules accepted: Orders

## 2012-12-16 DIAGNOSIS — L600 Ingrowing nail: Secondary | ICD-10-CM | POA: Diagnosis not present

## 2012-12-22 DIAGNOSIS — E782 Mixed hyperlipidemia: Secondary | ICD-10-CM | POA: Diagnosis not present

## 2012-12-24 DIAGNOSIS — M48061 Spinal stenosis, lumbar region without neurogenic claudication: Secondary | ICD-10-CM | POA: Diagnosis not present

## 2012-12-26 DIAGNOSIS — M8440XA Pathological fracture, unspecified site, initial encounter for fracture: Secondary | ICD-10-CM | POA: Diagnosis not present

## 2013-01-05 DIAGNOSIS — M48061 Spinal stenosis, lumbar region without neurogenic claudication: Secondary | ICD-10-CM | POA: Diagnosis not present

## 2013-01-08 DIAGNOSIS — H43819 Vitreous degeneration, unspecified eye: Secondary | ICD-10-CM | POA: Diagnosis not present

## 2013-01-08 DIAGNOSIS — Z961 Presence of intraocular lens: Secondary | ICD-10-CM | POA: Diagnosis not present

## 2013-01-15 ENCOUNTER — Other Ambulatory Visit: Payer: Self-pay | Admitting: Gastroenterology

## 2013-01-15 DIAGNOSIS — K449 Diaphragmatic hernia without obstruction or gangrene: Secondary | ICD-10-CM | POA: Diagnosis not present

## 2013-01-15 DIAGNOSIS — D131 Benign neoplasm of stomach: Secondary | ICD-10-CM | POA: Diagnosis not present

## 2013-01-15 DIAGNOSIS — K219 Gastro-esophageal reflux disease without esophagitis: Secondary | ICD-10-CM | POA: Diagnosis not present

## 2013-01-20 DIAGNOSIS — M48061 Spinal stenosis, lumbar region without neurogenic claudication: Secondary | ICD-10-CM | POA: Diagnosis not present

## 2013-02-02 DIAGNOSIS — M1990 Unspecified osteoarthritis, unspecified site: Secondary | ICD-10-CM | POA: Diagnosis not present

## 2013-02-02 DIAGNOSIS — M3500 Sicca syndrome, unspecified: Secondary | ICD-10-CM | POA: Diagnosis not present

## 2013-02-02 DIAGNOSIS — M2550 Pain in unspecified joint: Secondary | ICD-10-CM | POA: Diagnosis not present

## 2013-02-03 DIAGNOSIS — Z4789 Encounter for other orthopedic aftercare: Secondary | ICD-10-CM | POA: Diagnosis not present

## 2013-02-04 DIAGNOSIS — R259 Unspecified abnormal involuntary movements: Secondary | ICD-10-CM | POA: Diagnosis not present

## 2013-02-04 DIAGNOSIS — G20 Parkinson's disease: Secondary | ICD-10-CM | POA: Diagnosis not present

## 2013-02-04 DIAGNOSIS — R269 Unspecified abnormalities of gait and mobility: Secondary | ICD-10-CM | POA: Diagnosis not present

## 2013-02-23 IMAGING — CR DG LUMBAR SPINE 2-3V
2 series · 2 of 2 positions shown · non-contrast
Comparison: Intraoperative views of the lumbar spine 03/20/2012 and
MRI lumbar spine 02/20/2012.

CLINICAL DATA: History of lumbar surgery.  Back pain.

LUMBAR SPINE - 2-3 VIEW

[t lumbar spine ap]
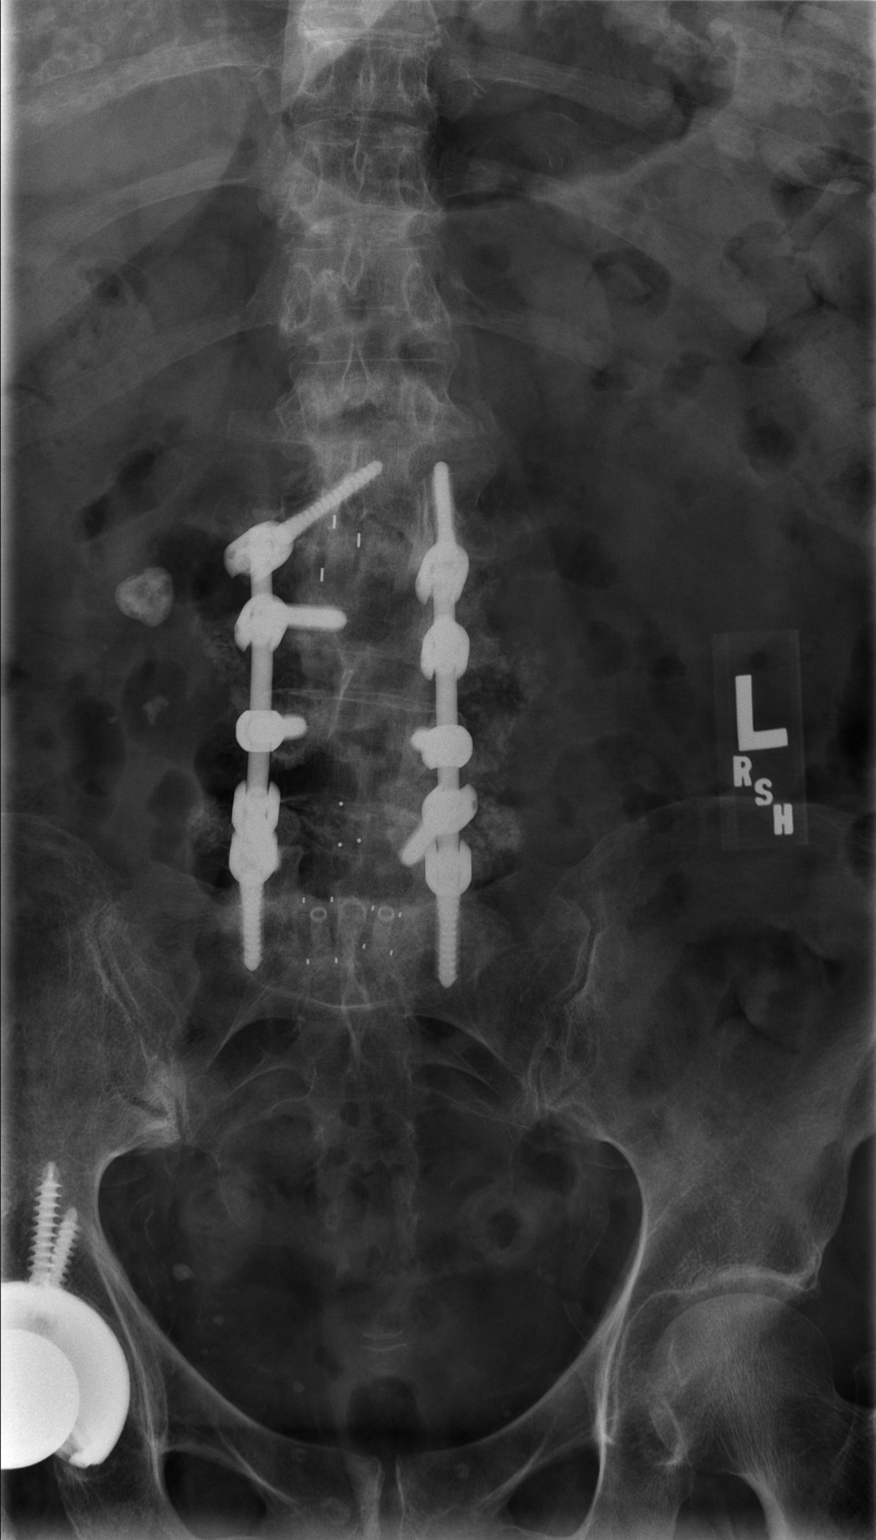

[t lumbar spine lat]
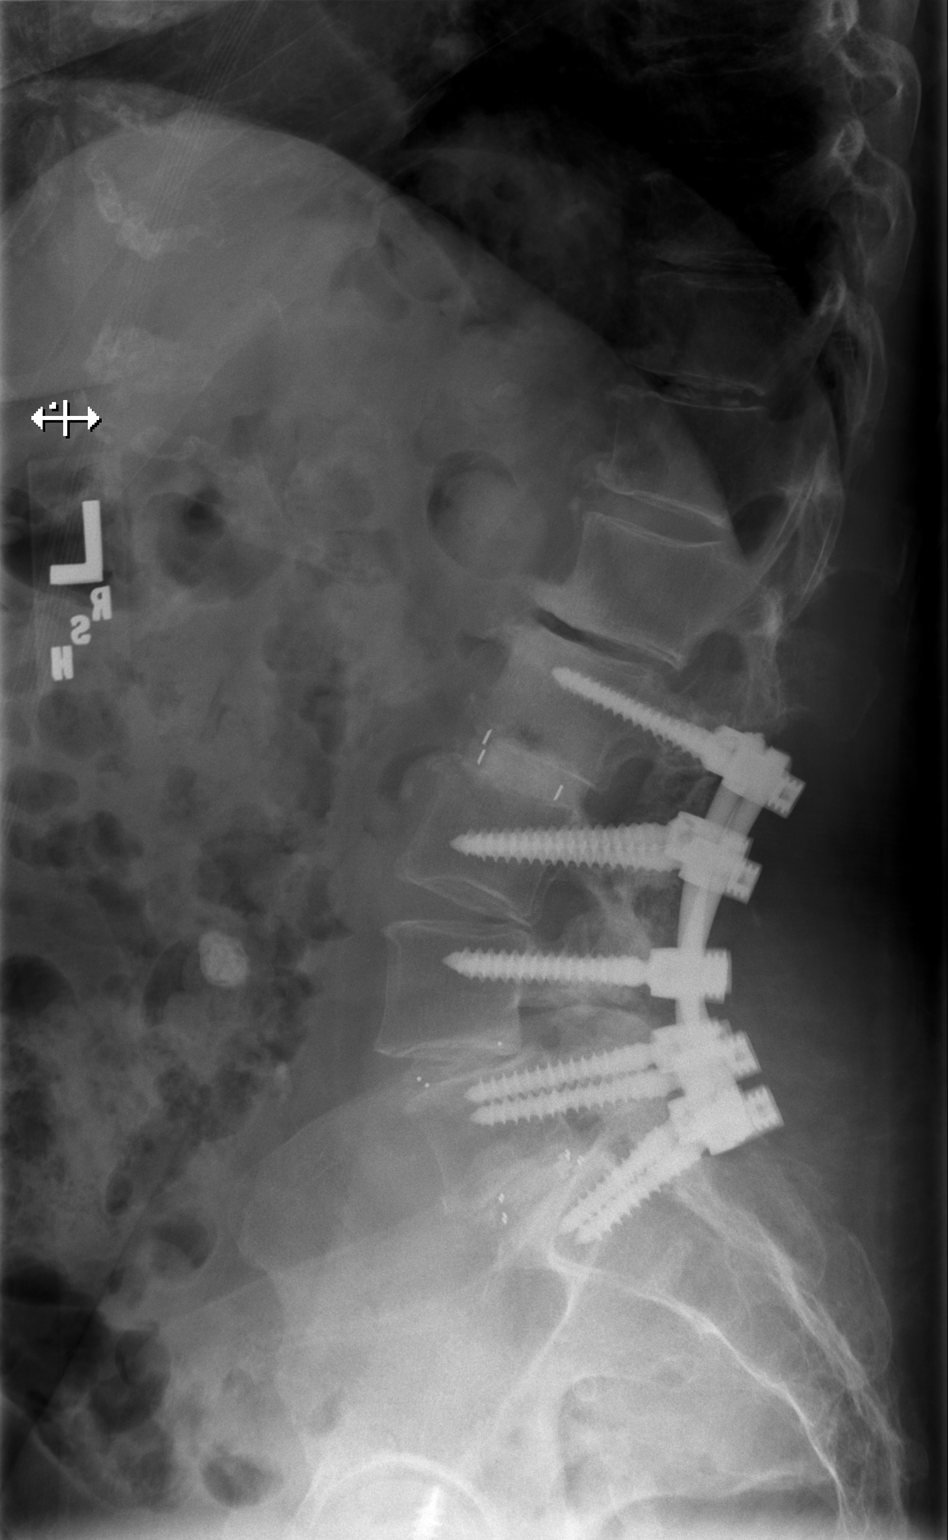

[2 of 2 positions shown; findings below may reference images not displayed]

FINDINGS: As seen on the most recent comparison, the patient is
status post L2-S1 laminectomy and fusion.  Hardware is intact.
Degenerative disc disease at L1-2 is again seen.  0.6 cm of
anterolisthesis of L5 on S1 is unchanged.  There is no fracture or
other new abnormality.  Right paraspinous calcification is
unchanged.  Right hip replacement is noted.
IMPRESSION: Status post L2-S1 fusion without evidence of complication.  No
acute abnormality.

## 2013-02-25 DIAGNOSIS — E039 Hypothyroidism, unspecified: Secondary | ICD-10-CM | POA: Diagnosis not present

## 2013-02-25 DIAGNOSIS — E782 Mixed hyperlipidemia: Secondary | ICD-10-CM | POA: Diagnosis not present

## 2013-02-25 DIAGNOSIS — I10 Essential (primary) hypertension: Secondary | ICD-10-CM | POA: Diagnosis not present

## 2013-02-26 DIAGNOSIS — M48061 Spinal stenosis, lumbar region without neurogenic claudication: Secondary | ICD-10-CM | POA: Diagnosis not present

## 2013-02-27 DIAGNOSIS — M4310 Spondylolisthesis, site unspecified: Secondary | ICD-10-CM | POA: Diagnosis not present

## 2013-02-27 DIAGNOSIS — Z006 Encounter for examination for normal comparison and control in clinical research program: Secondary | ICD-10-CM | POA: Diagnosis not present

## 2013-02-27 DIAGNOSIS — M5126 Other intervertebral disc displacement, lumbar region: Secondary | ICD-10-CM | POA: Diagnosis not present

## 2013-02-27 DIAGNOSIS — M48061 Spinal stenosis, lumbar region without neurogenic claudication: Secondary | ICD-10-CM | POA: Diagnosis not present

## 2013-03-05 DIAGNOSIS — M8440XA Pathological fracture, unspecified site, initial encounter for fracture: Secondary | ICD-10-CM | POA: Diagnosis not present

## 2013-03-05 DIAGNOSIS — M810 Age-related osteoporosis without current pathological fracture: Secondary | ICD-10-CM | POA: Diagnosis not present

## 2013-03-05 DIAGNOSIS — Z79899 Other long term (current) drug therapy: Secondary | ICD-10-CM | POA: Diagnosis not present

## 2013-03-18 DIAGNOSIS — R0602 Shortness of breath: Secondary | ICD-10-CM | POA: Diagnosis not present

## 2013-03-18 DIAGNOSIS — I119 Hypertensive heart disease without heart failure: Secondary | ICD-10-CM | POA: Diagnosis not present

## 2013-03-18 DIAGNOSIS — I5032 Chronic diastolic (congestive) heart failure: Secondary | ICD-10-CM | POA: Diagnosis not present

## 2013-04-06 DIAGNOSIS — M48061 Spinal stenosis, lumbar region without neurogenic claudication: Secondary | ICD-10-CM | POA: Diagnosis not present

## 2013-04-10 ENCOUNTER — Other Ambulatory Visit: Payer: Self-pay | Admitting: Neurological Surgery

## 2013-04-10 DIAGNOSIS — M48061 Spinal stenosis, lumbar region without neurogenic claudication: Secondary | ICD-10-CM

## 2013-04-14 DIAGNOSIS — D485 Neoplasm of uncertain behavior of skin: Secondary | ICD-10-CM | POA: Diagnosis not present

## 2013-04-22 ENCOUNTER — Ambulatory Visit
Admission: RE | Admit: 2013-04-22 | Discharge: 2013-04-22 | Disposition: A | Discharge status: Home / Self Care | Payer: Medicare Other | Source: Ambulatory Visit | Attending: Neurological Surgery | Admitting: Neurological Surgery

## 2013-04-22 DIAGNOSIS — M48061 Spinal stenosis, lumbar region without neurogenic claudication: Secondary | ICD-10-CM

## 2013-04-22 DIAGNOSIS — M545 Low back pain: Secondary | ICD-10-CM | POA: Diagnosis not present

## 2013-04-22 DIAGNOSIS — IMO0002 Reserved for concepts with insufficient information to code with codable children: Secondary | ICD-10-CM | POA: Diagnosis not present

## 2013-04-24 DIAGNOSIS — M549 Dorsalgia, unspecified: Secondary | ICD-10-CM | POA: Diagnosis not present

## 2013-05-08 DIAGNOSIS — G609 Hereditary and idiopathic neuropathy, unspecified: Secondary | ICD-10-CM | POA: Diagnosis not present

## 2013-05-08 DIAGNOSIS — IMO0002 Reserved for concepts with insufficient information to code with codable children: Secondary | ICD-10-CM | POA: Diagnosis not present

## 2013-05-08 DIAGNOSIS — M545 Low back pain, unspecified: Secondary | ICD-10-CM | POA: Diagnosis not present

## 2013-05-14 DIAGNOSIS — R0602 Shortness of breath: Secondary | ICD-10-CM | POA: Diagnosis not present

## 2013-05-14 DIAGNOSIS — M79609 Pain in unspecified limb: Secondary | ICD-10-CM | POA: Diagnosis not present

## 2013-05-14 DIAGNOSIS — I5032 Chronic diastolic (congestive) heart failure: Secondary | ICD-10-CM | POA: Diagnosis not present

## 2013-05-14 DIAGNOSIS — I119 Hypertensive heart disease without heart failure: Secondary | ICD-10-CM | POA: Diagnosis not present

## 2013-05-26 DIAGNOSIS — I5032 Chronic diastolic (congestive) heart failure: Secondary | ICD-10-CM | POA: Diagnosis not present

## 2013-05-26 DIAGNOSIS — R0602 Shortness of breath: Secondary | ICD-10-CM | POA: Diagnosis not present

## 2013-05-26 DIAGNOSIS — I119 Hypertensive heart disease without heart failure: Secondary | ICD-10-CM | POA: Diagnosis not present

## 2013-05-26 DIAGNOSIS — M79609 Pain in unspecified limb: Secondary | ICD-10-CM | POA: Diagnosis not present

## 2013-06-01 ENCOUNTER — Other Ambulatory Visit (HOSPITAL_COMMUNITY): Payer: Self-pay | Admitting: Family Medicine

## 2013-06-01 DIAGNOSIS — Z1231 Encounter for screening mammogram for malignant neoplasm of breast: Secondary | ICD-10-CM

## 2013-06-25 DIAGNOSIS — M7750 Other enthesopathy of unspecified foot: Secondary | ICD-10-CM | POA: Diagnosis not present

## 2013-06-25 DIAGNOSIS — M205X9 Other deformities of toe(s) (acquired), unspecified foot: Secondary | ICD-10-CM | POA: Diagnosis not present

## 2013-07-06 DIAGNOSIS — M549 Dorsalgia, unspecified: Secondary | ICD-10-CM | POA: Diagnosis not present

## 2013-07-06 DIAGNOSIS — M545 Low back pain, unspecified: Secondary | ICD-10-CM | POA: Diagnosis not present

## 2013-07-06 DIAGNOSIS — IMO0002 Reserved for concepts with insufficient information to code with codable children: Secondary | ICD-10-CM | POA: Diagnosis not present

## 2013-07-06 DIAGNOSIS — G609 Hereditary and idiopathic neuropathy, unspecified: Secondary | ICD-10-CM | POA: Diagnosis not present

## 2013-07-15 ENCOUNTER — Ambulatory Visit (HOSPITAL_COMMUNITY)
Admission: RE | Admit: 2013-07-15 | Discharge: 2013-07-15 | Disposition: A | Discharge status: Home / Self Care | Payer: Medicare Other | Source: Ambulatory Visit | Attending: Family Medicine | Admitting: Family Medicine

## 2013-07-15 DIAGNOSIS — Z1231 Encounter for screening mammogram for malignant neoplasm of breast: Secondary | ICD-10-CM | POA: Insufficient documentation

## 2013-07-28 DIAGNOSIS — M549 Dorsalgia, unspecified: Secondary | ICD-10-CM | POA: Diagnosis not present

## 2013-08-03 DIAGNOSIS — M19049 Primary osteoarthritis, unspecified hand: Secondary | ICD-10-CM | POA: Diagnosis not present

## 2013-08-03 DIAGNOSIS — M3500 Sicca syndrome, unspecified: Secondary | ICD-10-CM | POA: Diagnosis not present

## 2013-08-03 DIAGNOSIS — M79609 Pain in unspecified limb: Secondary | ICD-10-CM | POA: Diagnosis not present

## 2013-08-03 DIAGNOSIS — M1990 Unspecified osteoarthritis, unspecified site: Secondary | ICD-10-CM | POA: Diagnosis not present

## 2013-08-03 DIAGNOSIS — M549 Dorsalgia, unspecified: Secondary | ICD-10-CM | POA: Diagnosis not present

## 2013-08-26 ENCOUNTER — Encounter: Payer: Self-pay | Admitting: Diagnostic Neuroimaging

## 2013-08-26 ENCOUNTER — Ambulatory Visit (INDEPENDENT_AMBULATORY_CARE_PROVIDER_SITE_OTHER): Payer: Medicare Other | Admitting: Diagnostic Neuroimaging

## 2013-08-26 VITALS — BP 107/60 | HR 78 | Temp 98.8°F | Ht 60.0 in | Wt 159.0 lb

## 2013-08-26 DIAGNOSIS — G20 Parkinson's disease: Secondary | ICD-10-CM | POA: Diagnosis not present

## 2013-08-26 MED ORDER — CARBIDOPA-LEVODOPA 25-100 MG PO TABS: 1.0000 | ORAL_TABLET | Freq: Four times a day (QID) | ORAL | Status: DC

## 2013-08-26 NOTE — Progress Notes (Signed)
[facility]  PATIENT: [name] DOB: [birth year]  REFERRING CLINICIAN:  HISTORY FROM: patient and husband REASON FOR VISIT: follow up   HISTORICAL  CHIEF COMPLAINT:  Chief Complaint  Patient presents with  . per EMR    HISTORY OF PRESENT ILLNESS:   UPDATE 08/26/13: Since last visit, noticing some wearing off in early evening. Takes carb/levo 6:30am, noon, 9pm. Also getting more "worked up" lately, reading about PD online. No falls. Uses cane and walker. More fatigue and mental exhaustion.  UPDATE 02/04/13: Since last visit, has had some minor falls. tremor and PD sxs better on carb/levo. No anxiety. Some drooling and hypophonia. Not using walker or cane all the time. constipation stable.  UPDATE 10/06/12: Since last visit, had another back surgery (april 2013), dx'd with CHF, dx'd with sjogrens, also with fall in August 2013 with pelvic fracture. Reports good tremor control. Some freezing of gait.  UPDATE 02/13/12: Now on pramipexole 1mg  QID (7am, noon, 5pm, 10pm). Some wearing off around 5pm. Also, had rxn to forteo, now with right leg pain. Also getting lumbar radiculopathy eval per ortho.  UPDATE 08/14/11: Tremor developing around 4pm now.  More anxiety per husband.  Also with UTI x 2.  Now dx'd with left foot fractures (? "stubbed" toes; no falls).  UPDATE 04/27/11: Tolerating pramipexole 1mg  TID (7am, 1-2pm, 7p).  some wearing off right at 7p.  feels unsteady, and has ongoing back problems and pain.  UPDATE 01/24/11: Doing slightly better with pramipexole 1mg  BID.  Tremor returns later in the day before evening dose.  Still with gait and balance diff.     PRIOR HPI: 79 year old female with history of hypertension, hypercholesterolemia, hypothyroidism, fibromyalgia, restless leg syndrome, presenting for evaluation of tremor for the past 3 years. She is here with her husband.  Approximately 3 years ago patient developed intermittent tremor in her left  hand particularly in her thumb and fingers.  Tremor is most noticeable at rest and in the evening.  In June 2011, patient tripped, fell and broke her left wrist and forearm.  This was repaired surgically and in a cast; during this time and since then her tremor has improved.  Patient also has history of restless leg syndrome for the past 5 years. She's been on pramipexole for number of years (0.5mg  qhs) and recently increased to 1 mg qhs a few weeks ago.  The patient's mother also had restless leg syndrome. Patient has a diagnosis of iron deficiency anemia but is not on iron replacement.  REVIEW OF SYSTEMS: Full 14 system review of systems performed and notable only for fatigue eye pain hearing loss joint pain increased her spilling hot tremor decreased energy restless legs.  ALLERGIES: Allergies  Allergen Reactions  . Codeine     Unknown reaction  . Lactose Intolerance (Gi)     Reactions to meds that contain lactose derivitives  . Other Other (See Comments)    Pain meds received after surgery caused nightmares (Percocet,Hydrocodone,Morphine)    HOME MEDICATIONS: Prior to Admission medications   Medication Sig Start Date End Date Taking? Authorizing Provider  Calcium Carbonate (CALCIUM 500 PO) Take 1,000 mg by mouth daily.   Yes Historical Provider, MD  carbidopa-levodopa (SINEMET IR) 25-100 MG per tablet Take 1 tablet by mouth 4 (four) times daily. 08/26/13  Yes Suanne Marker, MD  Cholecalciferol (VITAMIN D) 2000 UNITS tablet Take 2,000 Units by mouth daily.   Yes Historical Provider, MD  CRESTOR 10 MG tablet Take 10  mg by mouth daily. 05/30/13  Yes Historical Provider, MD  dexlansoprazole (DEXILANT) 60 MG capsule Take 60 mg by mouth daily.   Yes Historical Provider, MD  diltiazem (CARDIZEM CD) 120 MG 24 hr capsule  11/19/12  Yes Historical Provider, MD  docusate sodium (COLACE) 100 MG capsule Take 100 mg by mouth at bedtime.   Yes Historical Provider, MD  ezetimibe (ZETIA) 10 MG  tablet Take 10 mg by mouth every evening.   Yes Historical Provider, MD  furosemide (LASIX) 20 MG tablet  09/25/12  Yes Historical Provider, MD  levothyroxine (SYNTHROID, LEVOTHROID) 50 MCG tablet Take 50 mcg by mouth daily.   Yes Historical Provider, MD  metoprolol tartrate (LOPRESSOR) 25 MG tablet Take 25 mg by mouth daily. 06/13/13  Yes Historical Provider, MD  Multiple Vitamin (MULITIVITAMIN WITH MINERALS) TABS Take 1 tablet by mouth daily.   Yes Historical Provider, MD  polyethylene glycol (MIRALAX / GLYCOLAX) packet Take 17 g by mouth daily.   Yes Historical Provider, MD  potassium chloride (K-DUR) 10 MEQ tablet  09/25/12  Yes Historical Provider, MD  pramipexole (MIRAPEX) 1 MG tablet Take 1 mg by mouth 3 (three) times daily.    Yes Historical Provider, MD  pravastatin (PRAVACHOL) 80 MG tablet Take 80 mg by mouth every evening.   Yes Historical Provider, MD  Teriparatide, Recombinant, (FORTEO) 600 MCG/2.4ML SOLN Inject 20 mcg into the skin every morning.   Yes Historical Provider, MD  traMADol (ULTRAM) 50 MG tablet Take 50 mg by mouth 3 (three) times daily as needed. for pain 05/12/12  Yes Historical Provider, MD  traMADol (ULTRAM-ER) 200 MG 24 hr tablet Take 200 mg by mouth daily.   Yes Historical Provider, MD   Outpatient Prescriptions Prior to Visit  Medication Sig Dispense Refill  . Calcium Carbonate (CALCIUM 500 PO) Take 1,000 mg by mouth daily.      . Cholecalciferol (VITAMIN D) 2000 UNITS tablet Take 2,000 Units by mouth daily.      Marland Kitchen dexlansoprazole (DEXILANT) 60 MG capsule Take 60 mg by mouth daily.      Marland Kitchen diltiazem (CARDIZEM CD) 120 MG 24 hr capsule       . docusate sodium (COLACE) 100 MG capsule Take 100 mg by mouth at bedtime.      Marland Kitchen ezetimibe (ZETIA) 10 MG tablet Take 10 mg by mouth every evening.      . furosemide (LASIX) 20 MG tablet       . levothyroxine (SYNTHROID, LEVOTHROID) 50 MCG tablet Take 50 mcg by mouth daily.      . Multiple Vitamin (MULITIVITAMIN WITH MINERALS)  TABS Take 1 tablet by mouth daily.      . polyethylene glycol (MIRALAX / GLYCOLAX) packet Take 17 g by mouth daily.      . potassium chloride (K-DUR) 10 MEQ tablet       . pramipexole (MIRAPEX) 1 MG tablet Take 1 mg by mouth 3 (three) times daily.       . pravastatin (PRAVACHOL) 80 MG tablet Take 80 mg by mouth every evening.      . Teriparatide, Recombinant, (FORTEO) 600 MCG/2.4ML SOLN Inject 20 mcg into the skin every morning.      . traMADol (ULTRAM) 50 MG tablet Take 50 mg by mouth 3 (three) times daily as needed. for pain      . carbidopa-levodopa (SINEMET IR) 25-100 MG per tablet 3 (three) times daily.       Marland Kitchen diltiazem (TIAZAC) 120 MG 24 hr capsule  Take 120 mg by mouth daily.        No facility-administered medications prior to visit.    PAST MEDICAL HISTORY: Past Medical History  Diagnosis Date  . Hypothyroidism   . Hypertension     dr t turner  . Shortness of breath   . H/O hiatal hernia   . Arthritis   . Anxiety   . Neuropathy   . Tremor   . Complication of anesthesia     nigthmares, hallucinations   . Anemia   . Osteoporosis   . CHF (congestive heart failure)   . Parkinson disease   . Hypercholesterolemia   . Fibromyalgia   . Restless leg   . Sjogren's disease     PAST SURGICAL HISTORY: Past Surgical History  Procedure Laterality Date  . Abdominal hysterectomy    . Back surgery      3 back surgeries,   lumbar fusion  . Wrist fracture surgery    . Eye surgery      cateracts  . Falls      variious fall, broken wrist,and toes  . Cardiac catheterization      clean,   7 yrs ago  . Total hip arthroplasty      right  . Spine surgery  April 2013    Back    FAMILY HISTORY: Family History  Problem Relation Age of Onset  . Heart attack Mother   . Restless legs syndrome Mother   . Heart failure Mother   . COPD Father     SOCIAL HISTORY:  History   Social History  . Marital Status: Married    Spouse Name: N/A    Number of Children: N/A  . Years  of Education: N/A   Occupational History  .      Homemaker   Social History Main Topics  . Smoking status: Never Smoker   . Smokeless tobacco: Never Used  . Alcohol Use: No  . Drug Use: No  . Sexual Activity: No   Other Topics Concern  . Not on file   Social History Narrative   Patient lives at home with her spouse.   Caffeine Use: tea     PHYSICAL EXAM  Filed Vitals:   08/26/13 1431  BP: 107/60  Pulse: 78  Temp: 98.8 F (37.1 C)  TempSrc: Oral  Height: 5' (1.524 m)  Weight: 159 lb (72.122 kg)    Not recorded    Body mass index is 31.05 kg/(m^2).  GENERAL EXAM: General: Patient is awake, alert and in no acute distress.  Well developed and groomed.  MASKED FACIES. Neck: Neck is supple. Cardiovascular: Heart is regular rate and rhythm with no murmurs.  Neurologic Exam  Mental Status: Awake, alert.  Language is fluent and comprehension intact. Cranial Nerves: Pupils are equal and reactive to light.  Visual fields are full to confrontation.  Conjugate eye movements are full and symmetric.  Facial sensation and strength are symmetric.  Hearing is intact.  Palate elevated symmetrically and uvula is midline.  Shoulder shug is symmetric.  Tongue is midline. Motor: Normal bulk and tone; MILD COGWHEELING WITH REINFORCEMENT.  NO REST OR POSTURAL TREMOR.  BRADYKINESIA IN BUE AND BLE. Full strength in the upper and lower extremities.  No pronator drift. Sensory: Intact and symmetric to light touch. Coordination: No ataxia or dysmetria on finger-nose or rapid alternating movement testing. Gait and Station: CAUTIOUS GAIT. SLOW TO RISE. USES A SINGLE POINT CANE TODAY. SLOW, DECR ARM SWING. SLOW TURNING.  Reflexes: Deep tendon reflexes in the upper extremity are present and symmetric; ABSENT AT KNEES AND ANKLES.   DIAGNOSTIC DATA (LABS, IMAGING, TESTING) - I reviewed patient records, labs, notes, testing and imaging myself where available.  Lab Results  Component Value Date    WBC 9.9 07/18/2012   HGB 9.8* 07/18/2012   HCT 29.6* 07/18/2012   MCV 88.1 07/18/2012   PLT 289 07/18/2012      Component Value Date/Time   NA 141 07/17/2012 1504   K 4.2 07/17/2012 1504   CL 106 07/17/2012 1504   CO2 23 03/14/2012 0857   GLUCOSE 117* 07/17/2012 1504   BUN 13 07/17/2012 1504   CREATININE 0.70 07/17/2012 1504   CALCIUM 9.5 03/14/2012 0857   PROT 6.5 05/22/2010 0923   ALBUMIN 3.9 05/22/2010 0923   AST 29 05/22/2010 0923   ALT 22 05/22/2010 0923   ALKPHOS 77 05/22/2010 0923   BILITOT 0.2* 05/22/2010 0923   GFRNONAA 81* 03/14/2012 0857   GFRAA >90 03/14/2012 0857   No results found for this basename: CHOL, HDL, LDLCALC, LDLDIRECT, TRIG, CHOLHDL   No results found for this basename: HGBA1C   No results found for this basename: VITAMINB12   No results found for this basename: TSH    11/10/10 MRI brain - mild atrophy and minimal chronic small vessel ischemic disease.   ASSESSMENT AND PLAN  79 y.o. female with hypertension, hypercholesterolemia, hypothyroidism, fibromyalgia, restless leg syndrome and parkinson's disease.  Some wearing off in early evening.   Dx: idiopathic parkinson's disease (akinetic/rigid) + RLS  PLAN: 1. continue pramipexole 1mg  TID + carb/levo 1 tab TID; change timing to 6:30am, noon, 5pm. May add 4th tab carb/levo if symptoms not controlled 2. Continue using rolling walker 3. Monitor anxiety symptoms  Return in about 6 months (around 02/23/2014) for with Heide Guile or Mekaylah Klich.    Suanne Marker, MD 08/26/2013, 3:23 PM Certified in Neurology, Neurophysiology and Neuroimaging  [facility] [address] [place], [place] [address] [phone]

## 2013-08-26 NOTE — Patient Instructions (Signed)
Try taking carb/levo and pramipexole around 6:30am, noon and 5 pm.

## 2013-08-27 DIAGNOSIS — H905 Unspecified sensorineural hearing loss: Secondary | ICD-10-CM | POA: Diagnosis not present

## 2013-09-03 DIAGNOSIS — L570 Actinic keratosis: Secondary | ICD-10-CM | POA: Diagnosis not present

## 2013-09-23 ENCOUNTER — Ambulatory Visit: Payer: Medicare Other | Admitting: Cardiology

## 2013-09-23 DIAGNOSIS — J209 Acute bronchitis, unspecified: Secondary | ICD-10-CM | POA: Diagnosis not present

## 2013-09-28 DIAGNOSIS — J40 Bronchitis, not specified as acute or chronic: Secondary | ICD-10-CM | POA: Diagnosis not present

## 2013-10-14 DIAGNOSIS — R05 Cough: Secondary | ICD-10-CM | POA: Diagnosis not present

## 2013-10-14 DIAGNOSIS — Z23 Encounter for immunization: Secondary | ICD-10-CM | POA: Diagnosis not present

## 2013-10-14 DIAGNOSIS — Z1331 Encounter for screening for depression: Secondary | ICD-10-CM | POA: Diagnosis not present

## 2013-10-15 ENCOUNTER — Other Ambulatory Visit: Payer: Self-pay

## 2013-10-19 ENCOUNTER — Encounter: Payer: Self-pay | Admitting: General Surgery

## 2013-10-19 ENCOUNTER — Ambulatory Visit (INDEPENDENT_AMBULATORY_CARE_PROVIDER_SITE_OTHER): Payer: Medicare Other | Admitting: Cardiology

## 2013-10-19 ENCOUNTER — Encounter: Payer: Self-pay | Admitting: Cardiology

## 2013-10-19 VITALS — BP 120/84 | HR 68 | Ht 60.0 in | Wt 156.0 lb

## 2013-10-19 DIAGNOSIS — K219 Gastro-esophageal reflux disease without esophagitis: Secondary | ICD-10-CM | POA: Insufficient documentation

## 2013-10-19 DIAGNOSIS — I519 Heart disease, unspecified: Secondary | ICD-10-CM | POA: Diagnosis not present

## 2013-10-19 DIAGNOSIS — R0602 Shortness of breath: Secondary | ICD-10-CM | POA: Insufficient documentation

## 2013-10-19 DIAGNOSIS — E7800 Pure hypercholesterolemia, unspecified: Secondary | ICD-10-CM | POA: Insufficient documentation

## 2013-10-19 DIAGNOSIS — I5030 Unspecified diastolic (congestive) heart failure: Secondary | ICD-10-CM | POA: Insufficient documentation

## 2013-10-19 DIAGNOSIS — I5189 Other ill-defined heart diseases: Secondary | ICD-10-CM

## 2013-10-19 DIAGNOSIS — K2270 Barrett's esophagus without dysplasia: Secondary | ICD-10-CM | POA: Insufficient documentation

## 2013-10-19 DIAGNOSIS — I10 Essential (primary) hypertension: Secondary | ICD-10-CM

## 2013-10-19 DIAGNOSIS — I5032 Chronic diastolic (congestive) heart failure: Secondary | ICD-10-CM

## 2013-10-19 DIAGNOSIS — I509 Heart failure, unspecified: Secondary | ICD-10-CM

## 2013-10-19 DIAGNOSIS — I739 Peripheral vascular disease, unspecified: Secondary | ICD-10-CM | POA: Insufficient documentation

## 2013-10-19 LAB — BASIC METABOLIC PANEL
BUN: 19 mg/dL (ref 6–23)
CO2: 27 mEq/L (ref 19–32)
Chloride: 105 mEq/L (ref 96–112)
Glucose, Bld: 100 mg/dL — ABNORMAL HIGH (ref 70–99)
Potassium: 4.2 mEq/L (ref 3.5–5.1)
Sodium: 140 mEq/L (ref 135–145)

## 2013-10-19 NOTE — Progress Notes (Signed)
[address] [place], [place]  [address] Phone: [phone] Fax:  [phone]  Date:  10/19/2013   ID:  [name], DOB 06/22/1931, MRN [id number]  PCP:  Allean Found, MD  Cardiologist:  Armanda Magic, MD     History of Present Illness: [name] is a 79 y.o. female with a history of chronic SOB secondary to diastolic dysfunction, chronic diastolic CHF, deconditioning and obesity.  She also has a history of HTN, chronic LE edema and PVD followed by Dr. Darrick Penna.  She is doing well.  She denies any chest pain, dizziness, palpitations or syncope.  When I last saw her she was complaining of left arm pain and a nuclear stress test was done which showed no ischemia and normal LVF.  She has chronic LE edema which comes and goes.  Her SOB was worse recently due to a bout of bronchitis.  Wt Readings from Last 3 Encounters:  08/26/13 159 lb (72.122 kg)  11/20/12 160 lb 8 oz (72.802 kg)  07/18/12 165 lb (74.844 kg)     Past Medical History  Diagnosis Date  . Hypothyroidism   . Shortness of breath   . H/O hiatal hernia   . Arthritis   . Anxiety   . Neuropathy   . Tremor   . Complication of anesthesia     nigthmares, hallucinations   . Anemia   . Osteoporosis   . CHF (congestive heart failure)   . Parkinson disease   . Fibromyalgia   . Restless leg   . Sjogren's disease   . Hypertension     dr t turner  . GERD (gastroesophageal reflux disease)   . Hypercholesterolemia   . Diastolic dysfunction   . SOB (shortness of breath)     chronic due to diastolic dysfunction, deconditioning, obesity  . PVD (peripheral vascular disease)     99% stenosis of left anteiror tibial artery, mod stenosis of left distal SFA and popliteal artery followed by Dr. Darrick Penna  . Barrett esophagus   . Chronic diastolic CHF (congestive heart failure)     Current Outpatient Prescriptions  Medication Sig Dispense Refill  . Calcium Carbonate (CALCIUM 500 PO) Take 1,000 mg by  mouth daily.      . carbidopa-levodopa (SINEMET IR) 25-100 MG per tablet Take 1 tablet by mouth 4 (four) times daily.  120 tablet  12  . Cholecalciferol (VITAMIN D) 2000 UNITS tablet Take 2,000 Units by mouth daily.      . CRESTOR 10 MG tablet Take 10 mg by mouth daily.      Marland Kitchen dexlansoprazole (DEXILANT) 60 MG capsule Take 60 mg by mouth daily.      Marland Kitchen diltiazem (CARDIZEM CD) 120 MG 24 hr capsule       . docusate sodium (COLACE) 100 MG capsule Take 100 mg by mouth at bedtime.      Marland Kitchen ezetimibe (ZETIA) 10 MG tablet Take 10 mg by mouth every evening.      . furosemide (LASIX) 20 MG tablet       . levothyroxine (SYNTHROID, LEVOTHROID) 50 MCG tablet Take 50 mcg by mouth daily.      . metoprolol tartrate (LOPRESSOR) 25 MG tablet Take 25 mg by mouth daily.      . Multiple Vitamin (MULITIVITAMIN WITH MINERALS) TABS Take 1 tablet by mouth daily.      . polyethylene glycol (MIRALAX / GLYCOLAX) packet Take 17 g by mouth daily.      Marland Kitchen  potassium chloride (K-DUR) 10 MEQ tablet       . pramipexole (MIRAPEX) 1 MG tablet Take 1 mg by mouth 3 (three) times daily.       . pravastatin (PRAVACHOL) 80 MG tablet Take 80 mg by mouth every evening.      . Teriparatide, Recombinant, (FORTEO) 600 MCG/2.4ML SOLN Inject 20 mcg into the skin every morning.      . traMADol (ULTRAM) 50 MG tablet Take 50 mg by mouth 3 (three) times daily as needed. for pain      . traMADol (ULTRAM-ER) 200 MG 24 hr tablet Take 200 mg by mouth daily.       No current facility-administered medications for this visit.    Allergies:    Allergies  Allergen Reactions  . Codeine     headache  . Lactose Intolerance (Gi)     Reactions to meds that contain lactose derivitives  . Lyrica [Pregabalin]     Leg edema   . Other Other (See Comments)    Pain meds received after surgery caused nightmares (Percocet,Hydrocodone,Morphine)  . Plaquenil [Hydroxychloroquine Sulfate]     Stomach upset  . Reglan [Metoclopramide] Other (See Comments)     Stomach upset  . Requip [Ropinirole Hcl]     Stomach upset   . Septra [Sulfamethoxazole-Tmp Ds] Nausea Only    Social History:  The patient  reports that she has never smoked. She has never used smokeless tobacco. She reports that she does not drink alcohol or use illicit drugs.   Family History:  The patient's family history includes COPD in her father; Heart attack in her mother; Heart disease in her mother; Heart failure in her mother; Hypertension in her mother; Restless legs syndrome in her mother.   ROS:  Please see the history of present illness.      All other systems reviewed and negative.   PHYSICAL EXAM: VS:  There were no vitals taken for this visit. Well nourished, well developed, in no acute distress HEENT: normal Neck: no JVD Cardiac:  normal S1, S2; RRR; 1/6 SM ar RUSB Lungs:  clear to auscultation bilaterally, no wheezing, rhonchi or rales Abd: soft, nontender, no hepatomegaly Ext: no edema Skin: warm and dry Neuro:  CNs 2-12 intact, no focal abnormalities noted  EKG:       ASSESSMENT AND PLAN:  1. Chronic diastolic CHF  - continue Lasix  - check BMET 2. Chronic SOB multifactorial from obesity, deconditioning and diastolic dysfunction.  Nuclear stress test 05/2013 with no ischemia. 3. HTN  - continue Cardizem and Metoprolol  Followup with me in 6 months  Signed, Armanda Magic, MD 10/19/2013 11:49 AM

## 2013-10-19 NOTE — Patient Instructions (Signed)
Your physician recommends that you continue on your current medications as directed. Please refer to the Current Medication list given to you today.  Your physician recommends that you go over to the lab today for a BMET  Your physician wants you to follow-up in: 6 Months with Dr. Sherlyn Lick will receive a reminder letter in the mail two months in advance. If you don't receive a letter, please call our office to schedule the follow-up appointment.

## 2013-10-29 DIAGNOSIS — L738 Other specified follicular disorders: Secondary | ICD-10-CM | POA: Diagnosis not present

## 2013-10-29 DIAGNOSIS — L538 Other specified erythematous conditions: Secondary | ICD-10-CM | POA: Diagnosis not present

## 2013-11-03 DIAGNOSIS — M1990 Unspecified osteoarthritis, unspecified site: Secondary | ICD-10-CM | POA: Diagnosis not present

## 2013-11-03 DIAGNOSIS — M549 Dorsalgia, unspecified: Secondary | ICD-10-CM | POA: Diagnosis not present

## 2013-11-03 DIAGNOSIS — M19049 Primary osteoarthritis, unspecified hand: Secondary | ICD-10-CM | POA: Diagnosis not present

## 2013-11-03 DIAGNOSIS — M3500 Sicca syndrome, unspecified: Secondary | ICD-10-CM | POA: Diagnosis not present

## 2013-11-23 ENCOUNTER — Other Ambulatory Visit: Payer: Self-pay | Admitting: Cardiology

## 2013-11-25 ENCOUNTER — Encounter: Payer: Self-pay | Admitting: Family

## 2013-11-26 ENCOUNTER — Ambulatory Visit (HOSPITAL_COMMUNITY)
Admission: RE | Admit: 2013-11-26 | Discharge: 2013-11-26 | Disposition: A | Discharge status: Home / Self Care | Payer: Medicare Other | Source: Ambulatory Visit | Attending: Neurosurgery | Admitting: Neurosurgery

## 2013-11-26 ENCOUNTER — Encounter: Payer: Self-pay | Admitting: Family

## 2013-11-26 ENCOUNTER — Encounter (HOSPITAL_COMMUNITY): Payer: Medicare Other

## 2013-11-26 ENCOUNTER — Ambulatory Visit (INDEPENDENT_AMBULATORY_CARE_PROVIDER_SITE_OTHER): Payer: Medicare Other | Admitting: Family

## 2013-11-26 VITALS — BP 133/77 | HR 78 | Resp 16 | Ht 59.0 in | Wt 151.0 lb

## 2013-11-26 DIAGNOSIS — I70219 Atherosclerosis of native arteries of extremities with intermittent claudication, unspecified extremity: Secondary | ICD-10-CM | POA: Diagnosis not present

## 2013-11-26 NOTE — Patient Instructions (Addendum)
Peripheral Vascular Disease Peripheral Vascular Disease (PVD), also called Peripheral Arterial Disease (PAD), is a circulation problem caused by cholesterol (atherosclerotic plaque) deposits in the arteries. PVD commonly occurs in the lower extremities (legs) but it can occur in other areas of the body, such as your arms. The cholesterol buildup in the arteries reduces blood flow which can cause pain and other serious problems. The presence of PVD can place a person at risk for Coronary Artery Disease (CAD).  CAUSES  Causes of PVD can be many. It is usually associated with more than one risk factor such as:   High Cholesterol.  Smoking.  Diabetes.  Lack of exercise or inactivity.  High blood pressure (hypertension).  Obesity.  Family history. SYMPTOMS   When the lower extremities are affected, patients with PVD may experience:  Leg pain with exertion or physical activity. This is called INTERMITTENT CLAUDICATION. This may present as cramping or numbness with physical activity. The location of the pain is associated with the level of blockage. For example, blockage at the abdominal level (distal abdominal aorta) may result in buttock or hip pain. Lower leg arterial blockage may result in calf pain.  As PVD becomes more severe, pain can develop with less physical activity.  In people with severe PVD, leg pain may occur at rest.  Other PVD signs and symptoms:  Leg numbness or weakness.  Coldness in the affected leg or foot, especially when compared to the other leg.  A change in leg color.  Patients with significant PVD are more prone to ulcers or sores on toes, feet or legs. These may take longer to heal or may reoccur. The ulcers or sores can become infected.  If signs and symptoms of PVD are ignored, gangrene may occur. This can result in the loss of toes or loss of an entire limb.  Not all leg pain is related to PVD. Other medical conditions can cause leg pain such  as:  Blood clots (embolism) or Deep Vein Thrombosis.  Inflammation of the blood vessels (vasculitis).  Spinal stenosis. DIAGNOSIS  Diagnosis of PVD can involve several different types of tests. These can include:  Pulse Volume Recording Method (PVR). This test is simple, painless and does not involve the use of X-rays. PVR involves measuring and comparing the blood pressure in the arms and legs. An ABI (Ankle-Brachial Index) is calculated. The normal ratio of blood pressures is 1. As this number becomes smaller, it indicates more severe disease.  < 0.95  indicates significant narrowing in one or more leg vessels.  <0.8 there will usually be pain in the foot, leg or buttock with exercise.  <0.4 will usually have pain in the legs at rest.  <0.25  usually indicates limb threatening PVD.  Doppler detection of pulses in the legs. This test is painless and checks to see if you have a pulses in your legs/feet.  A dye or contrast material (a substance that highlights the blood vessels so they show up on x-ray) may be given to help your caregiver better see the arteries for the following tests. The dye is eliminated from your body by the kidney's. Your caregiver may order blood work to check your kidney function and other laboratory values before the following tests are performed:  Magnetic Resonance Angiography (MRA). An MRA is a picture study of the blood vessels and arteries. The MRA machine uses a large magnet to produce images of the blood vessels.  Computed Tomography Angiography (CTA). A CTA is a   specialized x-ray that looks at how the blood flows in your blood vessels. An IV may be inserted into your arm so contrast dye can be injected.  Angiogram. Is a procedure that uses x-rays to look at your blood vessels. This procedure is minimally invasive, meaning a small incision (cut) is made in your groin. A small tube (catheter) is then inserted into the artery of your groin. The catheter is  guided to the blood vessel or artery your caregiver wants to examine. Contrast dye is injected into the catheter. X-rays are then taken of the blood vessel or artery. After the images are obtained, the catheter is taken out. TREATMENT  Treatment of PVD involves many interventions which may include:  Lifestyle changes:  Quitting smoking.  Exercise.  Following a low fat, low cholesterol diet.  Control of diabetes.  Foot care is very important to the PVD patient. Good foot care can help prevent infection.  Medication:  Cholesterol-lowering medicine.  Blood pressure medicine.  Anti-platelet drugs.  Certain medicines may reduce symptoms of Intermittent Claudication.  Interventional/Surgical options:  Angioplasty. An Angioplasty is a procedure that inflates a balloon in the blocked artery. This opens the blocked artery to improve blood flow.  Stent Implant. A wire mesh tube (stent) is placed in the artery. The stent expands and stays in place, allowing the artery to remain open.  Peripheral Bypass Surgery. This is a surgical procedure that reroutes the blood around a blocked artery to help improve blood flow. This type of procedure may be performed if Angioplasty or stent implants are not an option. SEEK IMMEDIATE MEDICAL CARE IF:   You develop pain or numbness in your arms or legs.  Your arm or leg turns cold, becomes blue in color.  You develop redness, warmth, swelling and pain in your arms or legs. MAKE SURE YOU:   Understand these instructions.  Will watch your condition.  Will get help right away if you are not doing well or get worse. Document Released: 01/03/2005 Document Revised: 02/18/2012 Document Reviewed: 11/30/2008 [facility] Patient Information 2014 [place], [place].  Ask your grandson about Metanx (methylated folic acid, vitamins B12 and B6), helps with diabetic neuropathy

## 2013-11-26 NOTE — Progress Notes (Signed)
[facility] [place] HISTORY AND PHYSICAL -PAD  Previous Bypass Surgery/ Stent Placement: No  History of Present Illness [name] is a 79 y.o. female patient of Dr. Darrick Penna followed for evaluation of lower extremity edema as well as mild claudication. 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 She returns today for follow up and ABI's. She had left first ingrown toenail removed over a year ago, since then she has redness at the tip of this toe. She has a remote history of left metatarsal fracture. She has pins and needles feelings at toes, plantar surfaces, and heels of both feet, mostly at night. Had 4 lumbar spine surgeries and has what sounds like right sciatic pain at times. She does not have claudication symptoms, her walking is limited by dyspnea. She denies non-healing wounds. Her appetite is not good lately, and is losing weight, denies food fear or abdominal pain after eating; is seeing a gastroenterologist, states she has a hiatal hernia.   Patient denies New Medical or Surgical History.  Pt Diabetic: No Pt smoker: non-smoker  Pt meds include: Statin :Yes Betablocker: Yes ASA: Yes Other anticoagulants/antiplatelets: no  Past Medical History  Diagnosis Date  . Hypothyroidism   . Shortness of breath   . H/O hiatal hernia   . Arthritis   . Anxiety   . Neuropathy   . Tremor   . Complication of anesthesia     nigthmares, hallucinations   . Anemia   . Osteoporosis   . CHF (congestive heart failure)   . Parkinson disease   . Fibromyalgia   . Restless leg   . Sjogren's disease   . Hypertension     dr t turner  . GERD (gastroesophageal reflux disease)   . Hypercholesterolemia   . Diastolic dysfunction   . SOB (shortness of breath)     chronic due to diastolic dysfunction, deconditioning, obesity  . PVD (peripheral vascular disease)     99% stenosis of left  anteiror tibial artery, mod stenosis of left distal SFA and popliteal artery followed by Dr. Darrick Penna  . Barrett esophagus   . Chronic diastolic CHF (congestive heart failure)     Social History History  Substance Use Topics  . Smoking status: Never Smoker   . Smokeless tobacco: Never Used  . Alcohol Use: No    Family History Family History  Problem Relation Age of Onset  . Heart attack Mother   . Restless legs syndrome Mother   . Heart failure Mother   . Heart disease Mother   . Hypertension Mother   . COPD Father     Past Surgical History  Procedure Laterality Date  . Abdominal hysterectomy    . Back surgery      3 back surgeries,   lumbar fusion  . Wrist fracture surgery    . Eye surgery      cateracts  . Falls      variious fall, broken wrist,and toes  . Total hip arthroplasty      right  . Spine surgery  April 2013    Back  . Cardiac catheterization  2006    normal    Allergies  Allergen Reactions  . Codeine     headache  . Lactose Intolerance (Gi)     Reactions to meds that contain lactose derivitives  . Lyrica [Pregabalin]     Leg edema   . Other Other (  See Comments)    Pain meds received after surgery caused nightmares (Percocet,Hydrocodone,Morphine)  . Plaquenil [Hydroxychloroquine Sulfate]     Stomach upset  . Reglan [Metoclopramide] Other (See Comments)    Stomach upset  . Requip [Ropinirole Hcl]     Stomach upset   . Septra [Sulfamethoxazole-Tmp Ds] Nausea Only    Current Outpatient Prescriptions  Medication Sig Dispense Refill  . Calcium Carbonate (CALCIUM 500 PO) Take 1,000 mg by mouth daily.      . carbidopa-levodopa (SINEMET IR) 25-100 MG per tablet Take 1 tablet by mouth 4 (four) times daily.  120 tablet  12  . Cholecalciferol (VITAMIN D) 2000 UNITS tablet Take 2,000 Units by mouth daily.      . CRESTOR 10 MG tablet Take 10 mg by mouth daily.      Marland Kitchen dexlansoprazole (DEXILANT) 60 MG capsule Take 60 mg by mouth daily.      Marland Kitchen diltiazem  (CARDIZEM CD) 120 MG 24 hr capsule       . docusate sodium (COLACE) 100 MG capsule Take 100 mg by mouth at bedtime.      Marland Kitchen ezetimibe (ZETIA) 10 MG tablet Take 10 mg by mouth every evening.      . furosemide (LASIX) 20 MG tablet TAKE 1 TABLET ONCE DAILY.  90 tablet  3  . levothyroxine (SYNTHROID, LEVOTHROID) 50 MCG tablet Take 50 mcg by mouth daily.      . metoprolol tartrate (LOPRESSOR) 25 MG tablet Take 25 mg by mouth daily.      . Multiple Vitamin (MULITIVITAMIN WITH MINERALS) TABS Take 1 tablet by mouth daily.      . polyethylene glycol (MIRALAX / GLYCOLAX) packet Take 17 g by mouth daily.      . potassium chloride (K-DUR) 10 MEQ tablet TAKE 1 TABLET ONCE DAILY.  90 tablet  3  . pramipexole (MIRAPEX) 1 MG tablet Take 1 mg by mouth 4 (four) times daily.       . pravastatin (PRAVACHOL) 80 MG tablet Take 80 mg by mouth every evening.      . Teriparatide, Recombinant, (FORTEO) 600 MCG/2.4ML SOLN Inject 20 mcg into the skin every morning.      . traMADol (ULTRAM) 50 MG tablet Take 50 mg by mouth 3 (three) times daily as needed. for pain      . traMADol (ULTRAM-ER) 200 MG 24 hr tablet Take 200 mg by mouth daily.       No current facility-administered medications for this visit.    ROS: See HPI for pertinent positives and negatives.  Physical Examination  Filed Vitals:   11/26/13 1025  BP: 133/77  Pulse: 78  Resp: 16    Filed Weights   11/26/13 1025  Weight: 151 lb (68.493 kg)   Body mass index is 30.48 kg/(m^2).  General: A&O x 3, WDWN, obese. Gait: normal Eyes: PERRLA, Pulmonary: CTAB, without wheezes , rales or rhonchi Cardiac: regular Rythm , without murmur          Carotid Bruits Left Right   Negative Negative  Aorta: is not palpable Radial pulses: 1+ and =                           VASCULAR EXAM: Extremities without ischemic changes other than ruddy tip of left great toe.  without Gangrene; without open wounds.  LE Pulses LEFT RIGHT       FEMORAL  not palpable  not palpable        POPLITEAL  not palpable   not palpable       POSTERIOR TIBIAL  not palpable   not palpable        DORSALIS PEDIS      ANTERIOR TIBIAL 2+ palpable  3+ palpable    Abdomen: soft, NT, no masses. Skin: no rashes, no ulcers noted. Musculoskeletal: no muscle wasting or atrophy.  Neurologic: A&O X 3; Appropriate Affect ; SENSATION: normal; MOTOR FUNCTION:  moving all extremities equally, motor strength 4/5 throughout. Speech is fluent/normal. CN 2-12 intact.    Non-Invasive Vascular Imaging: DATE: 11/26/2013 ABI's could not be calculated due to non-compressible vessels which is indicative of medial wall calcification, but all waveforms are triphasic. Waveform analysis suggests no evidence of arterial insufficiency.  ASSESSMENT: [name] is a 79 y.o. female who presents with a history of tight stenosis in her left anterior tibial artery as well as the popliteal occlusive disease. The neuropathic pain she is experiencing in her feet is not aggravated by exercise and seems to be related to her extensive history of lumbar spine issues. Her worst pain is in her left medial metatarsal joint which seems muscoskeletal in nature. She has no claudication symptoms and no non-healing wounds. Her ABI's indicate medial wall arterial calcification but also no evidence of arterial occlusive disease.    PLAN:  Chair exercises daily as discussed if she cannot walk, but walking is preferable to promote cardiovascular health. I discussed in depth with the patient the nature of atherosclerosis, and emphasized the importance of maximal medical management including strict control of blood pressure, blood glucose, and lipid levels, obtaining regular exercise, and continued cessation of smoking.  The patient is aware that without maximal medical management the underlying atherosclerotic  disease process will progress, limiting the benefit of any interventions. Based on the patient's vascular studies and examination, pt will return to clinic in 6  months for ABI's.  The patient was given information about PAD including signs, symptoms, treatment, what symptoms should prompt the patient to seek immediate medical care, and risk reduction measures to take.  Charisse March, RN, MSN, FNP-C [facility] [place] Phone: [phone]  Clinic MD: [facility]  11/26/2013 9:33 AM

## 2013-12-01 ENCOUNTER — Encounter: Payer: Self-pay | Admitting: *Deleted

## 2013-12-22 DIAGNOSIS — M545 Low back pain, unspecified: Secondary | ICD-10-CM | POA: Diagnosis not present

## 2013-12-23 DIAGNOSIS — K644 Residual hemorrhoidal skin tags: Secondary | ICD-10-CM | POA: Diagnosis not present

## 2013-12-30 DIAGNOSIS — M810 Age-related osteoporosis without current pathological fracture: Secondary | ICD-10-CM | POA: Diagnosis not present

## 2013-12-30 DIAGNOSIS — M8440XA Pathological fracture, unspecified site, initial encounter for fracture: Secondary | ICD-10-CM | POA: Diagnosis not present

## 2014-01-01 DIAGNOSIS — M8440XA Pathological fracture, unspecified site, initial encounter for fracture: Secondary | ICD-10-CM | POA: Diagnosis not present

## 2014-01-15 DIAGNOSIS — N390 Urinary tract infection, site not specified: Secondary | ICD-10-CM | POA: Diagnosis not present

## 2014-01-20 DIAGNOSIS — N390 Urinary tract infection, site not specified: Secondary | ICD-10-CM | POA: Diagnosis not present

## 2014-01-29 DIAGNOSIS — Z961 Presence of intraocular lens: Secondary | ICD-10-CM | POA: Diagnosis not present

## 2014-01-29 DIAGNOSIS — H04129 Dry eye syndrome of unspecified lacrimal gland: Secondary | ICD-10-CM | POA: Diagnosis not present

## 2014-01-29 DIAGNOSIS — D869 Sarcoidosis, unspecified: Secondary | ICD-10-CM | POA: Diagnosis not present

## 2014-02-24 ENCOUNTER — Encounter: Payer: Self-pay | Admitting: Diagnostic Neuroimaging

## 2014-02-24 ENCOUNTER — Ambulatory Visit (INDEPENDENT_AMBULATORY_CARE_PROVIDER_SITE_OTHER): Payer: Medicare Other | Admitting: Diagnostic Neuroimaging

## 2014-02-24 VITALS — BP 115/68 | HR 80 | Temp 97.5°F | Ht 59.0 in | Wt 157.0 lb

## 2014-02-24 DIAGNOSIS — G20 Parkinson's disease: Secondary | ICD-10-CM | POA: Diagnosis not present

## 2014-02-24 DIAGNOSIS — F4323 Adjustment disorder with mixed anxiety and depressed mood: Secondary | ICD-10-CM | POA: Diagnosis not present

## 2014-02-24 MED ORDER — PRAMIPEXOLE DIHYDROCHLORIDE 1 MG PO TABS: 1.0000 mg | ORAL_TABLET | Freq: Four times a day (QID) | ORAL | Status: DC

## 2014-02-24 MED ORDER — CARBIDOPA-LEVODOPA 25-100 MG PO TABS: 1.5000 | ORAL_TABLET | Freq: Four times a day (QID) | ORAL | Status: DC

## 2014-02-24 NOTE — Patient Instructions (Signed)
Increase carb/levo up to 1.5 tabs four times per day (total of 6 tabs per day).  Continue pramipexole.

## 2014-02-24 NOTE — Progress Notes (Signed)
[facility]  PATIENT: [name] DOB: 07/09/1931  REFERRING CLINICIAN:  HISTORY FROM: patient and husband REASON FOR VISIT: follow up   HISTORICAL  CHIEF COMPLAINT:  Chief Complaint  Patient presents with  . Follow-up    PD    HISTORY OF PRESENT ILLNESS:   UPDATE 02/24/14: Since last visit, has increased carb/levo up to 1 tab 4x per day; has also incr pramipexole up to 1mg  4x per day (for convenience of remembering, even though we normally dose BID or TID). Also with more anxiety, decr confidence, more tremor in right upper ext. Also reports some right shoulder, bilateral knee, low back pain. Has noticed some freezing of gait. Has appt with psychology later today.  UPDATE 08/26/13: Since last visit, noticing some wearing off in early evening. Takes carb/levo 6:30am, noon, 9pm. Also getting more "worked up" lately, reading about PD online. No falls. Uses cane and walker. More fatigue and mental exhaustion.  UPDATE 02/04/13: Since last visit, has had some minor falls. tremor and PD sxs better on carb/levo. No anxiety. Some drooling and hypophonia. Not using walker or cane all the time. constipation stable.  UPDATE 10/06/12: Since last visit, had another back surgery (april 2013), dx'd with CHF, dx'd with sjogrens, also with fall in August 2013 with pelvic fracture. Reports good tremor control. Some freezing of gait.  UPDATE 02/13/12: Now on pramipexole 1mg  QID (7am, noon, 5pm, 10pm). Some wearing off around 5pm. Also, had rxn to forteo, now with right leg pain. Also getting lumbar radiculopathy eval per ortho.  UPDATE 08/14/11: Tremor developing around 4pm now.  More anxiety per husband.  Also with UTI x 2.  Now dx'd with left foot fractures (? "stubbed" toes; no falls).  UPDATE 04/27/11: Tolerating pramipexole 1mg  TID (7am, 1-2pm, 7p).  some wearing off right at 7p.  feels unsteady, and has ongoing back problems and pain.  UPDATE 01/24/11: Doing slightly  better with pramipexole 1mg  BID.  Tremor returns later in the day before evening dose.  Still with gait and balance diff.     PRIOR HPI: 80 year old female with history of hypertension, hypercholesterolemia, hypothyroidism, fibromyalgia, restless leg syndrome, presenting for evaluation of tremor for the past 3 years. She is here with her husband.  Approximately 3 years ago patient developed intermittent tremor in her left hand particularly in her thumb and fingers.  Tremor is most noticeable at rest and in the evening.  In June 2011, patient tripped, fell and broke her left wrist and forearm.  This was repaired surgically and in a cast; during this time and since then her tremor has improved.  Patient also has history of restless leg syndrome for the past 5 years. She's been on pramipexole for number of years (0.5mg  qhs) and recently increased to 1 mg qhs a few weeks ago.  The patient's mother also had restless leg syndrome. Patient has a diagnosis of iron deficiency anemia but is not on iron replacement.  REVIEW OF SYSTEMS: Full 14 system review of systems performed and notable only for decreased appetite hearing loss trouble swallowing drooling eye itching like since to the shortness of breath leg swelling restless leg daytime sleepiness constipation food allergy joint pain joint swelling back pain aching muscle walking difficulty tremors depression anxiety.   ALLERGIES: Allergies  Allergen Reactions  . Codeine     headache  . Lactose Intolerance (Gi)     Reactions to meds that contain lactose derivitives and foods  . Lyrica [Pregabalin]  Leg edema   . Other Other (See Comments)    Pain meds received after surgery caused nightmares (Percocet,Hydrocodone,Morphine)  . Plaquenil [Hydroxychloroquine Sulfate]     Stomach upset  . Reglan [Metoclopramide] Other (See Comments)    Stomach upset  . Requip [Ropinirole Hcl]     Stomach upset   . Septra [Sulfamethoxazole-Tmp Ds] Nausea Only     HOME MEDICATIONS: Outpatient Prescriptions Prior to Visit  Medication Sig Dispense Refill  . Calcium Carbonate (CALCIUM 500 PO) Take 1,000 mg by mouth daily.      . CELEBREX 200 MG capsule daily.      . Cholecalciferol (VITAMIN D) 2000 UNITS tablet Take 2,000 Units by mouth daily.      . CRESTOR 10 MG tablet Take 10 mg by mouth daily.      Marland Kitchen dexlansoprazole (DEXILANT) 60 MG capsule Take 60 mg by mouth daily.      Marland Kitchen diltiazem (CARDIZEM CD) 120 MG 24 hr capsule       . docusate sodium (COLACE) 100 MG capsule Take 100 mg by mouth at bedtime.      Marland Kitchen ezetimibe (ZETIA) 10 MG tablet Take 10 mg by mouth every evening.      . furosemide (LASIX) 20 MG tablet TAKE 1 TABLET ONCE DAILY.  90 tablet  3  . levothyroxine (SYNTHROID, LEVOTHROID) 50 MCG tablet Take 50 mcg by mouth daily.      . metoprolol tartrate (LOPRESSOR) 25 MG tablet Take 25 mg by mouth daily.      . Multiple Vitamin (MULITIVITAMIN WITH MINERALS) TABS Take 1 tablet by mouth daily.      . polyethylene glycol (MIRALAX / GLYCOLAX) packet Take 17 g by mouth daily as needed.       . potassium chloride (K-DUR) 10 MEQ tablet TAKE 1 TABLET ONCE DAILY.  90 tablet  3  . pravastatin (PRAVACHOL) 80 MG tablet Take 80 mg by mouth every evening.      . traMADol (ULTRAM) 50 MG tablet Take 50 mg by mouth 3 (three) times daily as needed. for pain      . traMADol (ULTRAM-ER) 200 MG 24 hr tablet Take 200 mg by mouth daily.      . carbidopa-levodopa (SINEMET IR) 25-100 MG per tablet Take 1 tablet by mouth 4 (four) times daily.  120 tablet  12  . pramipexole (MIRAPEX) 1 MG tablet Take 1 mg by mouth 4 (four) times daily.       . Teriparatide, Recombinant, (FORTEO) 600 MCG/2.4ML SOLN Inject 20 mcg into the skin every morning.       No facility-administered medications prior to visit.    PAST MEDICAL HISTORY: Past Medical History  Diagnosis Date  . Hypothyroidism   . Shortness of breath   . H/O hiatal hernia   . Arthritis   . Anxiety   .  Neuropathy   . Tremor   . Complication of anesthesia     nigthmares, hallucinations   . Anemia   . Osteoporosis   . CHF (congestive heart failure)   . Parkinson disease   . Fibromyalgia   . Restless leg   . Sjogren's disease   . Hypertension     dr t turner  . GERD (gastroesophageal reflux disease)   . Hypercholesterolemia   . Diastolic dysfunction   . SOB (shortness of breath)     chronic due to diastolic dysfunction, deconditioning, obesity  . PVD (peripheral vascular disease)     99% stenosis of left  anteiror tibial artery, mod stenosis of left distal SFA and popliteal artery followed by [name]  . Barrett esophagus   . Chronic diastolic CHF (congestive heart failure)     PAST SURGICAL HISTORY: Past Surgical History  Procedure Laterality Date  . Abdominal hysterectomy    . Back surgery      3 back surgeries,   lumbar fusion  . Wrist fracture surgery    . Eye surgery      cateracts  . Falls      variious fall, broken wrist,and toes  . Total hip arthroplasty      right  . Cardiac catheterization  2006    normal  . Spine surgery  April 2013    Back X's 4    FAMILY HISTORY: Family History  Problem Relation Age of Onset  . Heart attack Mother   . Restless legs syndrome Mother   . Heart failure Mother   . Heart disease Mother   . Hypertension Mother   . COPD Father     SOCIAL HISTORY:  History   Social History  . Marital Status: Married    Spouse Name: [name]    Number of Children: 2  . Years of Education: [employer]   Occupational History  .      Homemaker   Social History Main Topics  . Smoking status: Never Smoker   . Smokeless tobacco: Never Used  . Alcohol Use: No  . Drug Use: No  . Sexual Activity: No   Other Topics Concern  . Not on file   Social History Narrative   Patient lives at home with her spouse.   Caffeine Use: tea     PHYSICAL EXAM  Filed Vitals:   02/24/14 0833  BP: 115/68  Pulse: 80  Temp: 97.5 F (36.4  C)  TempSrc: Oral  Height: 4\' 11"  (1.499 m)  Weight: 157 lb (71.215 kg)    Not recorded    Body mass index is 31.69 kg/(m^2).  GENERAL EXAM: General: Patient is awake, alert and in no acute distress.  Well developed and groomed.  MASKED FACIES. Neck: Neck is supple. Cardiovascular: Heart is regular rate and rhythm with no murmurs.  Neurologic Exam  Mental Status: Awake, alert.  Language is fluent and comprehension intact. Cranial Nerves: Pupils are equal and reactive to light.  Visual fields are full to confrontation.  Conjugate eye movements are full and symmetric.  Facial sensation and strength are symmetric.  Hearing is intact.  Palate elevated symmetrically and uvula is midline.  Shoulder shug is symmetric.  Tongue is midline. Motor: Normal bulk and tone; MILD COGWHEELING IN BUE  WITH REINFORCEMENT.  NO REST OR POSTURAL TREMOR.  BRADYKINESIA IN BUE AND BLE. Full strength in the upper and lower extremities; EXCEPT LIMITED IN SHOULDER DUE TO PAIN. No pronator drift. Sensory: Intact and symmetric to light touch. Coordination: No ataxia or dysmetria on finger-nose or rapid alternating movement testing. Gait and Station: CAUTIOUS GAIT. SLOW TO RISE, AND NEEDS HELP TO STAND UP.  SLOW, DECR ARM SWING. SLOW TURNING. Reflexes: Deep tendon reflexes in the upper extremity are present and symmetric; ABSENT AT KNEES AND ANKLES.   DIAGNOSTIC DATA (LABS, IMAGING, TESTING) - I reviewed patient records, labs, notes, testing and imaging myself where available.  Lab Results  Component Value Date   WBC 9.9 07/18/2012   HGB 9.8* 07/18/2012   HCT 29.6* 07/18/2012   MCV 88.1 07/18/2012   PLT 289 07/18/2012  Component Value Date/Time   NA 140 10/19/2013 1230   K 4.2 10/19/2013 1230   CL 105 10/19/2013 1230   CO2 27 10/19/2013 1230   GLUCOSE 100* 10/19/2013 1230   BUN 19 10/19/2013 1230   CREATININE 0.7 10/19/2013 1230   CALCIUM 10.1 10/19/2013 1230   PROT 6.5 05/22/2010 0923   ALBUMIN 3.9  05/22/2010 0923   AST 29 05/22/2010 0923   ALT 22 05/22/2010 0923   ALKPHOS 77 05/22/2010 0923   BILITOT 0.2* 05/22/2010 0923   GFRNONAA 81* 03/14/2012 0857   GFRAA >90 03/14/2012 0857   No results found for this basename: CHOL,  HDL,  LDLCALC,  LDLDIRECT,  TRIG,  CHOLHDL   No results found for this basename: HGBA1C   No results found for this basename: VITAMINB12   No results found for this basename: TSH    11/10/10 MRI brain - mild atrophy and minimal chronic small vessel ischemic disease.   ASSESSMENT AND PLAN  80 y.o. female with hypertension, hypercholesterolemia, hypothyroidism, fibromyalgia, restless leg syndrome and parkinson's disease.  Some wearing off in early evening. More anxiety lately.  Dx: idiopathic parkinson's disease (akinetic/rigid) + RLS  PLAN: 1. continue pramipexole 1mg  QID (even though we normally dose BID or TID) 2. Increase carb/levo 1.5 tabs QID 3. Continue using rolling walker; gradually increase stretching and physical therapy exercises 4. Follow up with psychology re: anxiety; may need SSRI  Return in about 6 months (around 08/27/2014).    [name], MD [id number], [id number] AM Certified in Neurology, Neurophysiology and Neuroimaging  [facility] [address] [place], [place] [address] [phone]

## 2014-03-02 DIAGNOSIS — IMO0002 Reserved for concepts with insufficient information to code with codable children: Secondary | ICD-10-CM | POA: Diagnosis not present

## 2014-03-02 DIAGNOSIS — G609 Hereditary and idiopathic neuropathy, unspecified: Secondary | ICD-10-CM | POA: Diagnosis not present

## 2014-03-02 DIAGNOSIS — M545 Low back pain, unspecified: Secondary | ICD-10-CM | POA: Diagnosis not present

## 2014-03-02 DIAGNOSIS — M549 Dorsalgia, unspecified: Secondary | ICD-10-CM | POA: Diagnosis not present

## 2014-03-03 ENCOUNTER — Other Ambulatory Visit: Payer: Self-pay | Admitting: Cardiology

## 2014-03-03 DIAGNOSIS — M19049 Primary osteoarthritis, unspecified hand: Secondary | ICD-10-CM | POA: Diagnosis not present

## 2014-03-03 DIAGNOSIS — M1990 Unspecified osteoarthritis, unspecified site: Secondary | ICD-10-CM | POA: Diagnosis not present

## 2014-03-03 DIAGNOSIS — M3500 Sicca syndrome, unspecified: Secondary | ICD-10-CM | POA: Diagnosis not present

## 2014-03-03 DIAGNOSIS — M549 Dorsalgia, unspecified: Secondary | ICD-10-CM | POA: Diagnosis not present

## 2014-03-08 DIAGNOSIS — N3000 Acute cystitis without hematuria: Secondary | ICD-10-CM | POA: Diagnosis not present

## 2014-04-01 ENCOUNTER — Other Ambulatory Visit: Payer: Self-pay

## 2014-04-01 MED ORDER — METOPROLOL TARTRATE 25 MG PO TABS: 25.0000 mg | ORAL_TABLET | Freq: Every day | ORAL | Status: DC

## 2014-04-13 DIAGNOSIS — M2040 Other hammer toe(s) (acquired), unspecified foot: Secondary | ICD-10-CM | POA: Diagnosis not present

## 2014-04-13 DIAGNOSIS — L851 Acquired keratosis [keratoderma] palmaris et plantaris: Secondary | ICD-10-CM | POA: Diagnosis not present

## 2014-04-13 DIAGNOSIS — G5760 Lesion of plantar nerve, unspecified lower limb: Secondary | ICD-10-CM | POA: Diagnosis not present

## 2014-04-13 DIAGNOSIS — M79609 Pain in unspecified limb: Secondary | ICD-10-CM | POA: Diagnosis not present

## 2014-04-15 DIAGNOSIS — M810 Age-related osteoporosis without current pathological fracture: Secondary | ICD-10-CM | POA: Diagnosis not present

## 2014-04-15 DIAGNOSIS — E039 Hypothyroidism, unspecified: Secondary | ICD-10-CM | POA: Diagnosis not present

## 2014-04-15 DIAGNOSIS — Z124 Encounter for screening for malignant neoplasm of cervix: Secondary | ICD-10-CM | POA: Diagnosis not present

## 2014-04-15 DIAGNOSIS — Z0000 Encounter for general adult medical examination without abnormal findings: Secondary | ICD-10-CM | POA: Diagnosis not present

## 2014-04-15 DIAGNOSIS — Z1331 Encounter for screening for depression: Secondary | ICD-10-CM | POA: Diagnosis not present

## 2014-04-15 DIAGNOSIS — E782 Mixed hyperlipidemia: Secondary | ICD-10-CM | POA: Diagnosis not present

## 2014-04-15 DIAGNOSIS — I5032 Chronic diastolic (congestive) heart failure: Secondary | ICD-10-CM | POA: Diagnosis not present

## 2014-04-15 DIAGNOSIS — I10 Essential (primary) hypertension: Secondary | ICD-10-CM | POA: Diagnosis not present

## 2014-04-15 DIAGNOSIS — Z23 Encounter for immunization: Secondary | ICD-10-CM | POA: Diagnosis not present

## 2014-04-19 ENCOUNTER — Encounter: Payer: Self-pay | Admitting: Cardiology

## 2014-04-19 ENCOUNTER — Ambulatory Visit (INDEPENDENT_AMBULATORY_CARE_PROVIDER_SITE_OTHER): Payer: Medicare Other | Admitting: Cardiology

## 2014-04-19 VITALS — BP 104/58 | HR 70 | Ht 59.0 in | Wt 153.0 lb

## 2014-04-19 DIAGNOSIS — I509 Heart failure, unspecified: Secondary | ICD-10-CM | POA: Diagnosis not present

## 2014-04-19 DIAGNOSIS — R011 Cardiac murmur, unspecified: Secondary | ICD-10-CM

## 2014-04-19 DIAGNOSIS — R0602 Shortness of breath: Secondary | ICD-10-CM

## 2014-04-19 DIAGNOSIS — I10 Essential (primary) hypertension: Secondary | ICD-10-CM

## 2014-04-19 DIAGNOSIS — I5032 Chronic diastolic (congestive) heart failure: Secondary | ICD-10-CM

## 2014-04-19 LAB — BASIC METABOLIC PANEL
BUN: 24 mg/dL — ABNORMAL HIGH (ref 6–23)
CO2: 27 mEq/L (ref 19–32)
Calcium: 9.6 mg/dL (ref 8.4–10.5)
Chloride: 103 mEq/L (ref 96–112)
Creatinine, Ser: 0.8 mg/dL (ref 0.4–1.2)
GFR: 77.72 mL/min (ref >60.00)
GLUCOSE: 109 mg/dL — AB (ref 70–99)
POTASSIUM: 4 meq/L (ref 3.5–5.1)
SODIUM: 138 meq/L (ref 135–145)

## 2014-04-19 LAB — BRAIN NATRIURETIC PEPTIDE: Pro B Natriuretic peptide (BNP): 91 pg/mL (ref 0.0–100.0)

## 2014-04-19 NOTE — Patient Instructions (Signed)
Your physician recommends that you continue on your current medications as directed. Please refer to the Current Medication list given to you today.  Your physician recommends that you go to the lab today for a BMET and BNP  Your physician has requested that you have an echocardiogram. Echocardiography is a painless test that uses sound waves to create images of your heart. It provides your doctor with information about the size and shape of your heart and how well your heart's chambers and valves are working. This procedure takes approximately one hour. There are no restrictions for this procedure.  Your physician wants you to follow-up in: 6 months with Dr Mallie Snooks will receive a reminder letter in the mail two months in advance. If you don't receive a letter, please call our office to schedule the follow-up appointment.

## 2014-04-19 NOTE — Progress Notes (Signed)
[place], [place], [place]  [address] Phone: [phone] Fax:  [phone]  Date:  04/19/2014   ID:  VERSA CRATON, DOB Jan 30, 1931, MRN [id number]  PCP:  [name], MD  Cardiologist:  Fransico Him, MD     History of Present Illness: [name] is a 80 y.o. female with a history of chronic SOB secondary to diastolic dysfunction, chronic diastolic CHF, deconditioning and obesity. She has had chronic SOB for years and a heart cath in 2006 showed normal coronary arteries and normal LVF with normal LVEDP.  She also has a history of HTN, chronic LE edema and PVD followed by [name]. She is doing well. She denies any chest pain, dizziness, palpitations or syncope. She has chronic LE edema which comes and goes.    Wt Readings from Last 3 Encounters:  04/19/14 153 lb (69.4 kg)  02/24/14 157 lb (71.215 kg)  11/26/13 151 lb (68.493 kg)     Past Medical History  Diagnosis Date  . Hypothyroidism   . Shortness of breath   . H/O hiatal hernia   . Arthritis   . Anxiety   . Neuropathy   . Tremor   . Complication of anesthesia     nigthmares, hallucinations   . Anemia   . Osteoporosis   . CHF (congestive heart failure)   . Parkinson disease   . Fibromyalgia   . Restless leg   . Sjogren's disease   . Hypertension     dr t Dodi Leu  . GERD (gastroesophageal reflux disease)   . Hypercholesterolemia   . Diastolic dysfunction   . SOB (shortness of breath)     chronic due to diastolic dysfunction, deconditioning, obesity  . PVD (peripheral vascular disease)     99% stenosis of left anteiror tibial artery, mod stenosis of left distal SFA and popliteal artery followed by [name]  . Barrett esophagus   . Chronic diastolic CHF (congestive heart failure)     Current Outpatient Prescriptions  Medication Sig Dispense Refill  . Calcium Carbonate (CALCIUM 500 PO) Take 1,000 mg by mouth daily.      . carbidopa-levodopa (SINEMET IR) 25-100 MG per  tablet Take 1.5 tablets by mouth 4 (four) times daily.  180 tablet  12  . CELEBREX 200 MG capsule daily.      . Cholecalciferol (VITAMIN D) 2000 UNITS tablet Take 2,000 Units by mouth daily.      Marland Kitchen dexlansoprazole (DEXILANT) 60 MG capsule Take 60 mg by mouth daily.      Marland Kitchen diltiazem (CARDIZEM CD) 120 MG 24 hr capsule TAKE (1) CAPSULE DAILY.  90 capsule  0  . docusate sodium (COLACE) 100 MG capsule Take 100 mg by mouth at bedtime.      Marland Kitchen ezetimibe (ZETIA) 10 MG tablet Take 10 mg by mouth every evening.      . furosemide (LASIX) 20 MG tablet TAKE 1 TABLET ONCE DAILY.  90 tablet  3  . levothyroxine (SYNTHROID, LEVOTHROID) 50 MCG tablet Take 50 mcg by mouth daily.      Marland Kitchen lovastatin (MEVACOR) 40 MG tablet Take 40 mg by mouth at bedtime.      . metoprolol tartrate (LOPRESSOR) 25 MG tablet Take 1 tablet (25 mg total) by mouth daily.  30 tablet  3  . Multiple Vitamin (MULITIVITAMIN WITH MINERALS) TABS Take 1 tablet by mouth daily.      . polyethylene glycol (MIRALAX / GLYCOLAX) packet Take 17 g by mouth  daily as needed.       . potassium chloride (K-DUR) 10 MEQ tablet TAKE 1 TABLET ONCE DAILY.  90 tablet  3  . pramipexole (MIRAPEX) 1 MG tablet Take 1 tablet (1 mg total) by mouth 4 (four) times daily.  120 tablet  12  . traMADol (ULTRAM) 50 MG tablet Take 50 mg by mouth 3 (three) times daily as needed. for pain      . traMADol (ULTRAM-ER) 200 MG 24 hr tablet Take 200 mg by mouth daily.       No current facility-administered medications for this visit.    Allergies:    Allergies  Allergen Reactions  . Codeine     headache  . Lactose Intolerance (Gi)     Reactions to meds that contain lactose derivitives and foods  . Lyrica [Pregabalin]     Leg edema   . Other Other (See Comments)    Pain meds received after surgery caused nightmares (Percocet,Hydrocodone,Morphine)  . Plaquenil [Hydroxychloroquine Sulfate]     Stomach upset  . Reglan [Metoclopramide] Other (See Comments)    Stomach upset  .  Requip [Ropinirole Hcl]     Stomach upset   . Septra [Sulfamethoxazole-Tmp Ds] Nausea Only    Social History:  The patient  reports that she has never smoked. She has never used smokeless tobacco. She reports that she does not drink alcohol or use illicit drugs.   Family History:  The patient's family history includes COPD in her father; Heart attack in her mother; Heart disease in her mother; Heart failure in her mother; Hypertension in her mother; Restless legs syndrome in her mother.   ROS:  Please see the history of present illness.      All other systems reviewed and negative.   PHYSICAL EXAM: VS:  BP 104/58  Pulse 70  Ht 4\' 11"  (1.499 m)  Wt 153 lb (69.4 kg)  BMI 30.89 kg/m2 Well nourished, well developed, in no acute distress HEENT: normal Neck: no JVD Cardiac:  normal S1, S2; RRR; 2/6 SM at RUSB to LLSB Lungs:  clear to auscultation bilaterally, no wheezing, rhonchi or rales Abd: soft, nontender, no hepatomegaly Ext: no edema Skin: warm and dry Neuro:  CNs 2-12 intact, no focal abnormalities noted       ASSESSMENT AND PLAN:  1.  Chronic diastolic CHF - continue Lasix/beta blocker  - check BMET 2.  Chronic SOB multifactorial from obesity, deconditioning and diastolic dysfunction. Nuclear stress test 05/2013 with no ischemia.  She has had chronic SOB dating back to 2006 at the time of her cath that showed normal coronary arteries, normal LVF and normal LVEDP.   - check BNP today since she is still SOB and has history of diastolic dysfunction.  She is on diuretics and beta blockers.  If BNP is normal then I will refer her to Pulmonary 3.  HTN - continue Cardizem and Metoprolol  4.  Heart murmur - recheck 2D echo to reassess  Followup with me in 6 months    Signed, Fransico Him, MD 04/19/2014 3:31 PM

## 2014-04-20 ENCOUNTER — Other Ambulatory Visit: Payer: Self-pay | Admitting: General Surgery

## 2014-04-20 ENCOUNTER — Telehealth: Payer: Self-pay | Admitting: Cardiology

## 2014-04-20 NOTE — Telephone Encounter (Signed)
New message ° ° ° °Returned Danielle's call °

## 2014-04-20 NOTE — Telephone Encounter (Signed)
Called pt to give results of labs

## 2014-04-21 DIAGNOSIS — N390 Urinary tract infection, site not specified: Secondary | ICD-10-CM | POA: Diagnosis not present

## 2014-05-06 ENCOUNTER — Telehealth: Payer: Self-pay | Admitting: Cardiology

## 2014-05-06 ENCOUNTER — Ambulatory Visit (HOSPITAL_COMMUNITY): Payer: Medicare Other | Attending: Internal Medicine | Admitting: Radiology

## 2014-05-06 DIAGNOSIS — R0602 Shortness of breath: Secondary | ICD-10-CM | POA: Diagnosis not present

## 2014-05-06 DIAGNOSIS — R011 Cardiac murmur, unspecified: Secondary | ICD-10-CM | POA: Insufficient documentation

## 2014-05-06 NOTE — Progress Notes (Signed)
Echocardiogram performed.  

## 2014-05-06 NOTE — Telephone Encounter (Signed)
Left message to call back  

## 2014-05-06 NOTE — Telephone Encounter (Signed)
Please let patient know that since her echo showed normal LVF and BNP was normal I would like her set up for pulmonary evaluation

## 2014-05-07 NOTE — Telephone Encounter (Signed)
Patient aware of result. Appointment with Dr Lamonte Sakai June 8 at 3:00. Advised husband of appointment date and time

## 2014-05-07 NOTE — Addendum Note (Signed)
Addended by: Alvina Filbert B on: 05/07/2014 12:22 PM   Modules accepted: Orders

## 2014-05-11 DIAGNOSIS — N3020 Other chronic cystitis without hematuria: Secondary | ICD-10-CM | POA: Diagnosis not present

## 2014-05-11 DIAGNOSIS — N816 Rectocele: Secondary | ICD-10-CM | POA: Diagnosis not present

## 2014-05-11 DIAGNOSIS — R300 Dysuria: Secondary | ICD-10-CM | POA: Diagnosis not present

## 2014-05-11 DIAGNOSIS — N952 Postmenopausal atrophic vaginitis: Secondary | ICD-10-CM | POA: Diagnosis not present

## 2014-05-12 DIAGNOSIS — D485 Neoplasm of uncertain behavior of skin: Secondary | ICD-10-CM | POA: Diagnosis not present

## 2014-05-12 DIAGNOSIS — I781 Nevus, non-neoplastic: Secondary | ICD-10-CM | POA: Diagnosis not present

## 2014-05-12 DIAGNOSIS — L538 Other specified erythematous conditions: Secondary | ICD-10-CM | POA: Diagnosis not present

## 2014-05-17 ENCOUNTER — Encounter: Payer: Self-pay | Admitting: Emergency Medicine

## 2014-05-17 ENCOUNTER — Ambulatory Visit (INDEPENDENT_AMBULATORY_CARE_PROVIDER_SITE_OTHER): Payer: Medicare Other | Admitting: Emergency Medicine

## 2014-05-17 VITALS — BP 122/74 | HR 73 | Ht 59.0 in | Wt 156.0 lb

## 2014-05-17 DIAGNOSIS — R0602 Shortness of breath: Secondary | ICD-10-CM | POA: Diagnosis not present

## 2014-05-17 NOTE — Patient Instructions (Signed)
We will perform full pulmonary function testing  Follow with Dr Lamonte Sakai to review your PFT next available opening.

## 2014-05-17 NOTE — Progress Notes (Signed)
Subjective:    Patient ID: [name], female    DOB: 1930-12-21, 80 y.o.   MRN: [id number]  HPI 80 yo never smoker, hx of Hiatal hernia, sjogren's, hypothyroidism, Parkinson's, PVD of LLE, diastolic dysfxn, allergies. Also with OA s/p hip replacement and multiple back surgeries. She has had longstanding exertional SOB that goes back for years. She has a significant 2nd hand tobacco exposure. She doesn't hear wheezing but she does have cough > a dry, non-productive. She is on Dexilant - controls her GERD sx.   Chemical nuclear stress test 2014 > normal.     Review of Systems  Constitutional: Positive for unexpected weight change. Negative for fever.  HENT: Positive for congestion, sinus pressure, sneezing and trouble swallowing. Negative for dental problem, ear pain, nosebleeds, postnasal drip, rhinorrhea and sore throat.   Eyes: Negative for redness and itching.  Respiratory: Positive for shortness of breath. Negative for cough, chest tightness and wheezing.   Cardiovascular: Negative for palpitations and leg swelling.  Gastrointestinal: Positive for abdominal distention. Negative for nausea and vomiting.  Genitourinary: Negative for dysuria.  Musculoskeletal: Positive for joint swelling.       Pain and stiffness  Skin: Negative for rash.  Neurological: Negative for headaches.  Hematological: Does not bruise/bleed easily.  Psychiatric/Behavioral: Negative for dysphoric mood. The patient is not nervous/anxious.    Past Medical History  Diagnosis Date  . Hypothyroidism   . Shortness of breath   . H/O hiatal hernia   . Arthritis   . Anxiety   . Neuropathy   . Tremor   . Complication of anesthesia     nigthmares, hallucinations   . Anemia   . Osteoporosis   . CHF (congestive heart failure)   . Parkinson disease   . Fibromyalgia   . Restless leg   . Sjogren's disease   . Hypertension     dr t turner  . GERD (gastroesophageal reflux disease)   .  Hypercholesterolemia   . Diastolic dysfunction   . SOB (shortness of breath)     chronic due to diastolic dysfunction, deconditioning, obesity  . PVD (peripheral vascular disease)     99% stenosis of left anteiror tibial artery, mod stenosis of left distal SFA and popliteal artery followed by [name]  . Barrett esophagus   . Chronic diastolic CHF (congestive heart failure)      Family History  Problem Relation Age of Onset  . Heart attack Mother   . Restless legs syndrome Mother   . Heart failure Mother   . Heart disease Mother   . Hypertension Mother   . COPD Father      History   Social History  . Marital Status: Married    Spouse Name: [name]    Number of Children: 2  . Years of Education: [employer]   Occupational History  .      Homemaker   Social History Main Topics  . Smoking status: Never Smoker   . Smokeless tobacco: Never Used  . Alcohol Use: No  . Drug Use: No  . Sexual Activity: No   Other Topics Concern  . Not on file   Social History Narrative   Patient lives at home with her spouse.   Caffeine Use: tea  [place], [place], [place], [place], [place], [place], [place] Has worked in a bank, as a Restaurant manager, fast food.   Allergies  Allergen Reactions  . Codeine     headache  . Lactose Intolerance (Gi)  Reactions to meds that contain lactose derivitives and foods  . Lyrica [Pregabalin]     Leg edema   . Other Other (See Comments)    Pain meds received after surgery caused nightmares (Percocet,Hydrocodone,Morphine)  . Plaquenil [Hydroxychloroquine Sulfate]     Stomach upset  . Reglan [Metoclopramide] Other (See Comments)    Stomach upset  . Requip [Ropinirole Hcl]     Stomach upset   . Septra [Sulfamethoxazole-Tmp Ds] Nausea Only     Outpatient Prescriptions Prior to Visit  Medication Sig Dispense Refill  . Calcium Carbonate (CALCIUM 500 PO) Take 1,000 mg by mouth daily.      . carbidopa-levodopa (SINEMET IR) 25-100 MG per tablet Take 1.5  tablets by mouth 4 (four) times daily.  180 tablet  12  . CELEBREX 200 MG capsule daily.      . Cholecalciferol (VITAMIN D) 2000 UNITS tablet Take 2,000 Units by mouth daily.      Marland Kitchen dexlansoprazole (DEXILANT) 60 MG capsule Take 60 mg by mouth daily.      Marland Kitchen diltiazem (CARDIZEM CD) 120 MG 24 hr capsule TAKE (1) CAPSULE DAILY.  90 capsule  0  . docusate sodium (COLACE) 100 MG capsule Take 100 mg by mouth at bedtime.      Marland Kitchen ezetimibe (ZETIA) 10 MG tablet Take 10 mg by mouth every evening.      . furosemide (LASIX) 20 MG tablet TAKE 1 TABLET ONCE DAILY.  90 tablet  3  . levothyroxine (SYNTHROID, LEVOTHROID) 50 MCG tablet Take 50 mcg by mouth daily.      Marland Kitchen lovastatin (MEVACOR) 40 MG tablet Take 40 mg by mouth at bedtime.      . metoprolol tartrate (LOPRESSOR) 25 MG tablet Take 1 tablet (25 mg total) by mouth daily.  30 tablet  3  . Multiple Vitamin (MULITIVITAMIN WITH MINERALS) TABS Take 1 tablet by mouth daily.      . polyethylene glycol (MIRALAX / GLYCOLAX) packet Take 17 g by mouth daily as needed.       . potassium chloride (K-DUR) 10 MEQ tablet TAKE 1 TABLET ONCE DAILY.  90 tablet  3  . pramipexole (MIRAPEX) 1 MG tablet Take 1 tablet (1 mg total) by mouth 4 (four) times daily.  120 tablet  12  . traMADol (ULTRAM) 50 MG tablet Take 50 mg by mouth 3 (three) times daily as needed. for pain      . traMADol (ULTRAM-ER) 200 MG 24 hr tablet Take 200 mg by mouth daily.       No facility-administered medications prior to visit.         Objective:   Physical Exam Filed Vitals:   05/17/14 1503  BP: 122/74  Pulse: 73  Height: 4\' 11"  (1.499 m)  Weight: 156 lb (70.761 kg)  SpO2: 97%   Gen: Pleasant, elderly woman, in no distress,  normal affect  ENT: No lesions,  mouth clear,  oropharynx clear, no postnasal drip  Neck: No JVD, no TMG, no carotid bruits  Lungs: No use of accessory muscles, clear without rales or rhonchi  Cardiovascular: RRR, heart sounds normal, no murmur or gallops, no  peripheral edema  Musculoskeletal: No deformities, no cyanosis or clubbing  Neuro: alert, non focal  Skin: Warm, no lesions or rashes   TTE 05/06/14 --  - Left ventricle: The cavity size was normal. Wall thickness was normal. Systolic function was normal. The estimated ejection fraction was in the range of 60% to 65%. Wall motion was  normal; there were no regional wall motion abnormalities. Doppler parameters are consistent with abnormal left ventricular relaxation (grade 1 diastolic dysfunction). LV filling pressure is indeterminate. - Aortic valve: Mildly calcified leaflets. Transvalvular velocity was minimally increased. There was no stenosis. There was no regurgitation. - Mitral valve: Structurally normal valve. There was trace to mild regurgitation. - Left atrium: The atrium was normal in size. - Atrial septum: There was increased thickness of the septum, consistent with lipomatous hypertrophy. - Pulmonary arteries: PA peak pressure: 19 mm Hg (S). Impressions: - LVEF 27-61%, mild diastolic dysfuction, indeterminate LV filling pressure. Mildly calcified aortic valve with elevated velocity, but no clear stenosis. Trace to mild MR.     Assessment & Plan:  SOB (shortness of breath) Suspect multifactorial - obesity, deconditioning, hiatal hernia, diastolic dysfxn. Need to r/o AFL as a contributor as this would be treatable.  - full PFT - will consider cardiopulm rehab at some point.  - rov after PFT

## 2014-05-17 NOTE — Assessment & Plan Note (Signed)
Suspect multifactorial - obesity, deconditioning, hiatal hernia, diastolic dysfxn. Need to r/o AFL as a contributor as this would be treatable.  - full PFT - will consider cardiopulm rehab at some point.  - rov after PFT

## 2014-05-24 DIAGNOSIS — N3020 Other chronic cystitis without hematuria: Secondary | ICD-10-CM | POA: Diagnosis not present

## 2014-05-24 DIAGNOSIS — N390 Urinary tract infection, site not specified: Secondary | ICD-10-CM | POA: Diagnosis not present

## 2014-05-25 ENCOUNTER — Ambulatory Visit (INDEPENDENT_AMBULATORY_CARE_PROVIDER_SITE_OTHER): Payer: Medicare Other | Admitting: Emergency Medicine

## 2014-05-25 ENCOUNTER — Encounter: Payer: Self-pay | Admitting: Emergency Medicine

## 2014-05-25 ENCOUNTER — Ambulatory Visit (HOSPITAL_COMMUNITY)
Admission: RE | Admit: 2014-05-25 | Discharge: 2014-05-25 | Disposition: A | Discharge status: Home / Self Care | Payer: Medicare Other | Source: Ambulatory Visit | Attending: Emergency Medicine | Admitting: Emergency Medicine

## 2014-05-25 VITALS — BP 120/70 | HR 75 | Ht 59.0 in | Wt 156.0 lb

## 2014-05-25 DIAGNOSIS — R0602 Shortness of breath: Secondary | ICD-10-CM

## 2014-05-25 LAB — PULMONARY FUNCTION TEST
FEF 25-75 POST: 1.61 L/s
FEF 25-75 Pre: 1.79 L/sec
FEF2575-%Change-Post: -10 %
FEF2575-%PRED-POST: 146 %
FEF2575-%Pred-Pre: 162 %
FEV1-%Change-Post: 0 %
FEV1-%PRED-POST: 114 %
FEV1-%Pred-Pre: 115 %
FEV1-POST: 1.68 L
FEV1-PRE: 1.69 L
FEV1FVC-%Change-Post: -1 %
FEV1FVC-%PRED-PRE: 109 %
FEV6-%CHANGE-POST: 2 %
FEV6-%Pred-Post: 111 %
FEV6-%Pred-Pre: 109 %
FEV6-Post: 2.09 L
FEV6-Pre: 2.04 L
FEV6FVC-%PRED-POST: 106 %
FEV6FVC-%Pred-Pre: 106 %
FVC-%Change-Post: 0 %
FVC-%PRED-POST: 104 %
FVC-%Pred-Pre: 104 %
FVC-PRE: 2.07 L
FVC-Post: 2.09 L
POST FEV1/FVC RATIO: 80 %
PRE FEV1/FVC RATIO: 81 %
PRE FEV6/FVC RATIO: 100 %
Post FEV6/FVC ratio: 100 %

## 2014-05-25 MED ORDER — ALBUTEROL SULFATE HFA 108 (90 BASE) MCG/ACT IN AERS: 2.0000 | INHALATION_SPRAY | Freq: Four times a day (QID) | RESPIRATORY_TRACT | Status: DC | PRN

## 2014-05-25 MED ORDER — ALBUTEROL SULFATE (2.5 MG/3ML) 0.083% IN NEBU
2.5000 mg | INHALATION_SOLUTION | Freq: Once | RESPIRATORY_TRACT | Status: AC
  Administered 2014-05-25: 2.5 mg via RESPIRATORY_TRACT

## 2014-05-25 NOTE — Assessment & Plan Note (Signed)
Again, suspect this is multifactorial. Her spirometry looks normal with possible AFL based on shape of the curve. We discussed a trial of albuterol to see if she would benefit. She is willing to do so.  - will trial albuterol - walking oximetry  - rov 3

## 2014-05-25 NOTE — Patient Instructions (Signed)
Please start albuterol to use 2 puffs if needed for shortness of breath. You may also want try taking this medication 10 minutes before you have to walk to see if it helps you.  Follow with Dr Lamonte Sakai in 3 months or sooner if you have any problems.

## 2014-05-25 NOTE — Addendum Note (Signed)
Addended by: Carlos American A on: 05/25/2014 04:32 PM   Modules accepted: Orders

## 2014-05-25 NOTE — Progress Notes (Signed)
Subjective:    Patient ID: [name], female    DOB: 08-Jan-1931, 80 y.o.   MRN: [id number]  Shortness of Breath Pertinent negatives include no ear pain, fever, headaches, leg swelling, rash, rhinorrhea, sore throat, vomiting or wheezing.   80 yo never smoker, hx of Hiatal hernia, sjogren's, hypothyroidism, Parkinson's, PVD of LLE, diastolic dysfxn, allergies. Also with OA s/p hip replacement and multiple back surgeries. She has had longstanding exertional SOB that goes back for years. She has a significant 2nd hand tobacco exposure. She doesn't hear wheezing but she does have cough > a dry, non-productive. She is on Dexilant - controls her GERD sx.   Chemical nuclear stress test 2014 > normal.   ROV 05/25/14 -- follow up for dyspnea.  Underwent PFT today > normal AF with ?? Some evidence on FV loop for obstruction.  No new issues since last time.    Review of Systems  Constitutional: Positive for unexpected weight change. Negative for fever.  HENT: Positive for congestion, sinus pressure, sneezing and trouble swallowing. Negative for dental problem, ear pain, nosebleeds, postnasal drip, rhinorrhea and sore throat.   Eyes: Negative for redness and itching.  Respiratory: Positive for shortness of breath. Negative for cough, chest tightness and wheezing.   Cardiovascular: Negative for palpitations and leg swelling.  Gastrointestinal: Positive for abdominal distention. Negative for nausea and vomiting.  Genitourinary: Negative for dysuria.  Musculoskeletal: Positive for joint swelling.       Pain and stiffness  Skin: Negative for rash.  Neurological: Negative for headaches.  Hematological: Does not bruise/bleed easily.  Psychiatric/Behavioral: Negative for dysphoric mood. The patient is not nervous/anxious.        Objective:   Physical Exam Filed Vitals:   05/25/14 1536  BP: 120/70  Pulse: 75  Height: 4\' 11"  (1.499 m)  Weight: 156 lb (70.761 kg)  SpO2: 98%   Gen:  Pleasant, elderly woman, in no distress,  normal affect  ENT: No lesions,  mouth clear,  oropharynx clear, no postnasal drip  Neck: No JVD, no TMG, no carotid bruits  Lungs: No use of accessory muscles, clear without rales or rhonchi  Cardiovascular: RRR, heart sounds normal, no murmur or gallops, no peripheral edema  Musculoskeletal: No deformities, no cyanosis or clubbing  Neuro: alert, non focal  Skin: Warm, no lesions or rashes   TTE 05/06/14 --  - Left ventricle: The cavity size was normal. Wall thickness was normal. Systolic function was normal. The estimated ejection fraction was in the range of 60% to 65%. Wall motion was normal; there were no regional wall motion abnormalities. Doppler parameters are consistent with abnormal left ventricular relaxation (grade 1 diastolic dysfunction). LV filling pressure is indeterminate. - Aortic valve: Mildly calcified leaflets. Transvalvular velocity was minimally increased. There was no stenosis. There was no regurgitation. - Mitral valve: Structurally normal valve. There was trace to mild regurgitation. - Left atrium: The atrium was normal in size. - Atrial septum: There was increased thickness of the septum, consistent with lipomatous hypertrophy. - Pulmonary arteries: PA peak pressure: 19 mm Hg (S). Impressions: - LVEF 09-47%, mild diastolic dysfuction, indeterminate LV filling pressure. Mildly calcified aortic valve with elevated velocity, but no clear stenosis. Trace to mild MR.     Assessment & Plan:  SOB (shortness of breath) Again, suspect this is multifactorial. Her spirometry looks normal with possible AFL based on shape of the curve. We discussed a trial of albuterol to see if she would benefit. She is willing  to do so.  - will trial albuterol - walking oximetry  - rov 3

## 2014-05-26 ENCOUNTER — Encounter: Payer: Self-pay | Admitting: Family

## 2014-05-27 ENCOUNTER — Ambulatory Visit (HOSPITAL_COMMUNITY)
Admission: RE | Admit: 2014-05-27 | Discharge: 2014-05-27 | Disposition: A | Discharge status: Home / Self Care | Payer: Medicare Other | Source: Ambulatory Visit | Attending: Family | Admitting: Family

## 2014-05-27 ENCOUNTER — Other Ambulatory Visit: Payer: Self-pay | Admitting: Cardiology

## 2014-05-27 ENCOUNTER — Ambulatory Visit (INDEPENDENT_AMBULATORY_CARE_PROVIDER_SITE_OTHER): Payer: Medicare Other | Admitting: Family

## 2014-05-27 ENCOUNTER — Encounter: Payer: Self-pay | Admitting: Family

## 2014-05-27 VITALS — BP 105/65 | HR 68 | Resp 16 | Ht 59.0 in | Wt 155.0 lb

## 2014-05-27 DIAGNOSIS — I70219 Atherosclerosis of native arteries of extremities with intermittent claudication, unspecified extremity: Secondary | ICD-10-CM | POA: Insufficient documentation

## 2014-05-27 NOTE — Addendum Note (Signed)
Addended by: Mena Goes on: 05/27/2014 01:01 PM   Modules accepted: Orders

## 2014-05-27 NOTE — Patient Instructions (Signed)

## 2014-05-27 NOTE — Progress Notes (Signed)
[facility] [place] HISTORY AND PHYSICAL -PAD  History of Present Illness [name] is a 80 y.o. female patient of [name] followed for evaluation of lower extremity edema as well as mild claudication. 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  She returns today for follow up and ABI's.  She had left first ingrown toenail removed about 2012, since then she has redness at the tip of this toe.  She has a remote history of left metatarsal fracture.  She has pins and needles feelings at toes, plantar surfaces, and heels of both feet, mostly at night.  Had 4 lumbar spine surgeries and has what sounds like right sciatic pain at times.  She does not have claudication symptoms, her walking is limited by dyspnea.  She denies non-healing wounds.  Her appetite is not good lately, and is losing weight, denies food fear or abdominal pain after eating; is seeing a gastroenterologist, states she has a hiatal hernia.  Patient denies New Medical or Surgical History.   Pt Diabetic: No  Pt smoker: non-smoker  Pt meds include:  Statin :Yes  Betablocker: Yes  ASA: Yes  Other anticoagulants/antiplatelets: no   Past Medical History  Diagnosis Date  . Hypothyroidism   . Shortness of breath   . H/O hiatal hernia   . Arthritis   . Anxiety   . Neuropathy   . Tremor   . Complication of anesthesia     nigthmares, hallucinations   . Anemia   . Osteoporosis   . CHF (congestive heart failure)   . Parkinson disease   . Fibromyalgia   . Restless leg   . Sjogren's disease   . Hypertension     dr t turner  . GERD (gastroesophageal reflux disease)   . Hypercholesterolemia   . Diastolic dysfunction   . SOB (shortness of breath)     chronic due to diastolic dysfunction, deconditioning, obesity  . PVD (peripheral vascular disease)     99% stenosis of left anteiror tibial artery, mod stenosis of left  distal SFA and popliteal artery followed by [name]  . Barrett esophagus   . Chronic diastolic CHF (congestive heart failure)     Social History History  Substance Use Topics  . Smoking status: Never Smoker   . Smokeless tobacco: Never Used  . Alcohol Use: No    Family History Family History  Problem Relation Age of Onset  . Heart attack Mother   . Restless legs syndrome Mother   . Heart failure Mother   . Heart disease Mother   . Hypertension Mother   . COPD Father     Past Surgical History  Procedure Laterality Date  . Abdominal hysterectomy    . Back surgery      3 back surgeries,   lumbar fusion  . Wrist fracture surgery    . Eye surgery      cateracts  . Falls      variious fall, broken wrist,and toes  . Total hip arthroplasty      right  . Cardiac catheterization  2006    normal  . Spine surgery  April 2013    Back X's 4    Allergies  Allergen Reactions  . Codeine     headache  . Lactose Intolerance (Gi)     Reactions to meds that contain lactose derivitives and foods  . Lyrica [Pregabalin]  Leg edema   . Other Other (See Comments)    Pain meds received after surgery caused nightmares (Percocet,Hydrocodone,Morphine)  . Plaquenil [Hydroxychloroquine Sulfate]     Stomach upset  . Reglan [Metoclopramide] Other (See Comments)    Stomach upset  . Requip [Ropinirole Hcl]     Stomach upset   . Septra [Sulfamethoxazole-Tmp Ds] Nausea Only    Current Outpatient Prescriptions  Medication Sig Dispense Refill  . albuterol (PROVENTIL HFA;VENTOLIN HFA) 108 (90 BASE) MCG/ACT inhaler Inhale 2 puffs into the lungs every 6 (six) hours as needed for wheezing or shortness of breath.  1 Inhaler  6  . Calcium Carbonate (CALCIUM 500 PO) Take 1,000 mg by mouth daily.      . carbidopa-levodopa (SINEMET IR) 25-100 MG per tablet Take 1.5 tablets by mouth 4 (four) times daily.  180 tablet  12  . CELEBREX 200 MG capsule daily.      . Cholecalciferol (VITAMIN D)  2000 UNITS tablet Take 2,000 Units by mouth daily.      Marland Kitchen dexlansoprazole (DEXILANT) 60 MG capsule Take 60 mg by mouth daily.      Marland Kitchen diltiazem (CARDIZEM CD) 120 MG 24 hr capsule TAKE (1) CAPSULE DAILY.  90 capsule  0  . docusate sodium (COLACE) 100 MG capsule Take 100 mg by mouth at bedtime.      Marland Kitchen ezetimibe (ZETIA) 10 MG tablet Take 10 mg by mouth every evening.      . furosemide (LASIX) 20 MG tablet TAKE 1 TABLET ONCE DAILY.  90 tablet  3  . levothyroxine (SYNTHROID, LEVOTHROID) 50 MCG tablet Take 50 mcg by mouth daily.      Marland Kitchen lovastatin (MEVACOR) 40 MG tablet Take 40 mg by mouth at bedtime.      . metoprolol tartrate (LOPRESSOR) 25 MG tablet Take 1 tablet (25 mg total) by mouth daily.  30 tablet  3  . Multiple Vitamin (MULITIVITAMIN WITH MINERALS) TABS Take 1 tablet by mouth daily.      . polyethylene glycol (MIRALAX / GLYCOLAX) packet Take 17 g by mouth daily as needed.       . potassium chloride (K-DUR) 10 MEQ tablet TAKE 1 TABLET ONCE DAILY.  90 tablet  3  . pramipexole (MIRAPEX) 1 MG tablet Take 1 tablet (1 mg total) by mouth 4 (four) times daily.  120 tablet  12  . traMADol (ULTRAM) 50 MG tablet Take 50 mg by mouth 3 (three) times daily as needed. for pain      . traMADol (ULTRAM-ER) 200 MG 24 hr tablet Take 200 mg by mouth daily.       No current facility-administered medications for this visit.    ROS: See HPI for pertinent positives and negatives.   Physical Examination  Filed Vitals:   05/27/14 1008  BP: 105/65  Pulse: 68  Resp: 16  Height: 4\' 11"  (1.499 m)  Weight: 155 lb (70.308 kg)  SpO2: 100%   Body mass index is 31.29 kg/(m^2).  General: A&O x 3, WDWN, obese female.  Gait: normal  Eyes: PERRLA,  Pulmonary: CTAB, without wheezes , rales or rhonchi  Cardiac: regular Rythm , with murmur  Carotid Bruits  Left  Right    Negative  Negative    Aorta: is not palpable  Radial pulses: 1+ and =   VASCULAR EXAM:  Extremities without ischemic changes.  without  Gangrene; without open wounds.   LE Pulses  LEFT  RIGHT   FEMORAL  Tender to palpation, no  swelling or discoloration  2+ palpable   POPLITEAL  not palpable  not palpable   POSTERIOR TIBIAL  not palpable  not palpable   DORSALIS PEDIS  ANTERIOR TIBIAL  2+ palpable  2+ palpable    Abdomen: soft, NT, no masses., dry mouth. Skin: no rashes, no ulcers noted.  Musculoskeletal: no muscle wasting or atrophy.  Neurologic: A&O X 3; Appropriate Affect ; SENSATION: normal; MOTOR FUNCTION: moving all extremities equally, motor strength 4/5 throughout. Speech is fluent/normal. CN 2-12 intact.   Non-Invasive Vascular Imaging: DATE: 05/27/2014 ABI: RIGHT >1.3 indicating the presence of tibial artery medial calcification, Waveforms: triphasic;  LEFT 0.91-1.3, Waveforms: triphasic TBI's: Right: 0.72, Left: 0.62 Previous (11/26/13) ABI's: Right: N/C, Left: N/C, TBI: Right: 0.59, Left: 0.57  ASSESSMENT: [name] is a 80 y.o. female who presents with a history of tight stenosis in her left anterior tibial artery as well as the popliteal occlusive disease.  The neuropathic pain she is experiencing in her feet is not aggravated by exercise and seems to be related to her extensive history of lumbar spine issues.  She has no claudication symptoms and no non-healing wounds. Improvement in both toe brachial indices in the last 6 months. 2+ palpable bilateral dorsalis pedis pulses and the ABI waveforms are triphasic, also indicating good arterial perfusion. The pain in her legs, especially at night, is unlikely to be related to PAD. Consider other differentials such as her known spine issues and/or under treated hypothyroidism, both of which can cause neuropathic type pain. She also has fibromyalgia, Sjogren's Disease, and Parkinson's Disease. She has never used tobacco and does not have a diagnosis of DM.   PLAN:  Continue as much walking as is safely possible, minimizing fall risk. Continue daily  chair exercises as discussed. I discussed in depth with the patient the nature of atherosclerosis, and emphasized the importance of maximal medical management including strict control of blood pressure, blood glucose, and lipid levels, obtaining regular exercise, and continued cessation of smoking.  The patient is aware that without maximal medical management the underlying atherosclerotic disease process will progress, limiting the benefit of any interventions.  Based on the patient's vascular studies and examination, pt will return to clinic in 1 year for ABI's.  The patient was given information about PAD including signs, symptoms, treatment, what symptoms should prompt the patient to seek immediate medical care, and risk reduction measures to take.  Clemon Chambers, RN, MSN, FNP-C [facility] Phone: [phone]  Clinic MD: Early  05/27/2014 9:49 AM

## 2014-05-28 ENCOUNTER — Other Ambulatory Visit: Payer: Self-pay

## 2014-05-28 MED ORDER — DILTIAZEM HCL ER COATED BEADS 120 MG PO CP24: ORAL_CAPSULE | ORAL | Status: DC

## 2014-06-01 DIAGNOSIS — N816 Rectocele: Secondary | ICD-10-CM | POA: Diagnosis not present

## 2014-06-01 DIAGNOSIS — R300 Dysuria: Secondary | ICD-10-CM | POA: Diagnosis not present

## 2014-06-01 DIAGNOSIS — N952 Postmenopausal atrophic vaginitis: Secondary | ICD-10-CM | POA: Diagnosis not present

## 2014-06-01 DIAGNOSIS — N3020 Other chronic cystitis without hematuria: Secondary | ICD-10-CM | POA: Diagnosis not present

## 2014-07-06 DIAGNOSIS — Z6831 Body mass index (BMI) 31.0-31.9, adult: Secondary | ICD-10-CM | POA: Diagnosis not present

## 2014-07-06 DIAGNOSIS — M545 Low back pain, unspecified: Secondary | ICD-10-CM | POA: Diagnosis not present

## 2014-07-06 DIAGNOSIS — M549 Dorsalgia, unspecified: Secondary | ICD-10-CM | POA: Diagnosis not present

## 2014-07-14 DIAGNOSIS — M8440XA Pathological fracture, unspecified site, initial encounter for fracture: Secondary | ICD-10-CM | POA: Diagnosis not present

## 2014-07-14 DIAGNOSIS — M810 Age-related osteoporosis without current pathological fracture: Secondary | ICD-10-CM | POA: Diagnosis not present

## 2014-07-23 DIAGNOSIS — R269 Unspecified abnormalities of gait and mobility: Secondary | ICD-10-CM | POA: Diagnosis not present

## 2014-07-23 DIAGNOSIS — M25519 Pain in unspecified shoulder: Secondary | ICD-10-CM | POA: Diagnosis not present

## 2014-07-26 ENCOUNTER — Other Ambulatory Visit: Payer: Self-pay

## 2014-07-26 ENCOUNTER — Ambulatory Visit
Admission: RE | Admit: 2014-07-26 | Discharge: 2014-07-26 | Disposition: A | Discharge status: Home / Self Care | Payer: Medicare Other | Source: Ambulatory Visit

## 2014-07-26 DIAGNOSIS — Z1231 Encounter for screening mammogram for malignant neoplasm of breast: Secondary | ICD-10-CM | POA: Diagnosis not present

## 2014-07-26 DIAGNOSIS — R269 Unspecified abnormalities of gait and mobility: Secondary | ICD-10-CM | POA: Diagnosis not present

## 2014-07-26 DIAGNOSIS — M25519 Pain in unspecified shoulder: Secondary | ICD-10-CM | POA: Diagnosis not present

## 2014-07-27 ENCOUNTER — Other Ambulatory Visit: Payer: Self-pay | Admitting: Cardiology

## 2014-07-27 DIAGNOSIS — R269 Unspecified abnormalities of gait and mobility: Secondary | ICD-10-CM | POA: Diagnosis not present

## 2014-07-27 DIAGNOSIS — M25519 Pain in unspecified shoulder: Secondary | ICD-10-CM | POA: Diagnosis not present

## 2014-07-29 DIAGNOSIS — M8440XA Pathological fracture, unspecified site, initial encounter for fracture: Secondary | ICD-10-CM | POA: Diagnosis not present

## 2014-07-29 DIAGNOSIS — M25519 Pain in unspecified shoulder: Secondary | ICD-10-CM | POA: Diagnosis not present

## 2014-07-29 DIAGNOSIS — R269 Unspecified abnormalities of gait and mobility: Secondary | ICD-10-CM | POA: Diagnosis not present

## 2014-08-02 DIAGNOSIS — R269 Unspecified abnormalities of gait and mobility: Secondary | ICD-10-CM | POA: Diagnosis not present

## 2014-08-02 DIAGNOSIS — M25519 Pain in unspecified shoulder: Secondary | ICD-10-CM | POA: Diagnosis not present

## 2014-08-03 DIAGNOSIS — R269 Unspecified abnormalities of gait and mobility: Secondary | ICD-10-CM | POA: Diagnosis not present

## 2014-08-03 DIAGNOSIS — M25519 Pain in unspecified shoulder: Secondary | ICD-10-CM | POA: Diagnosis not present

## 2014-08-06 DIAGNOSIS — M25519 Pain in unspecified shoulder: Secondary | ICD-10-CM | POA: Diagnosis not present

## 2014-08-06 DIAGNOSIS — R269 Unspecified abnormalities of gait and mobility: Secondary | ICD-10-CM | POA: Diagnosis not present

## 2014-08-09 DIAGNOSIS — R269 Unspecified abnormalities of gait and mobility: Secondary | ICD-10-CM | POA: Diagnosis not present

## 2014-08-09 DIAGNOSIS — M25519 Pain in unspecified shoulder: Secondary | ICD-10-CM | POA: Diagnosis not present

## 2014-08-10 DIAGNOSIS — M25519 Pain in unspecified shoulder: Secondary | ICD-10-CM | POA: Diagnosis not present

## 2014-08-10 DIAGNOSIS — R269 Unspecified abnormalities of gait and mobility: Secondary | ICD-10-CM | POA: Diagnosis not present

## 2014-08-11 DIAGNOSIS — M3500 Sicca syndrome, unspecified: Secondary | ICD-10-CM | POA: Diagnosis not present

## 2014-08-11 DIAGNOSIS — M1990 Unspecified osteoarthritis, unspecified site: Secondary | ICD-10-CM | POA: Diagnosis not present

## 2014-08-11 DIAGNOSIS — M549 Dorsalgia, unspecified: Secondary | ICD-10-CM | POA: Diagnosis not present

## 2014-08-11 DIAGNOSIS — M19049 Primary osteoarthritis, unspecified hand: Secondary | ICD-10-CM | POA: Diagnosis not present

## 2014-08-13 ENCOUNTER — Ambulatory Visit (INDEPENDENT_AMBULATORY_CARE_PROVIDER_SITE_OTHER): Payer: Medicare Other | Admitting: Podiatry

## 2014-08-13 ENCOUNTER — Ambulatory Visit (INDEPENDENT_AMBULATORY_CARE_PROVIDER_SITE_OTHER): Payer: Medicare Other

## 2014-08-13 ENCOUNTER — Encounter: Payer: Self-pay | Admitting: Podiatry

## 2014-08-13 DIAGNOSIS — R269 Unspecified abnormalities of gait and mobility: Secondary | ICD-10-CM | POA: Diagnosis not present

## 2014-08-13 DIAGNOSIS — G589 Mononeuropathy, unspecified: Secondary | ICD-10-CM | POA: Diagnosis not present

## 2014-08-13 DIAGNOSIS — I70219 Atherosclerosis of native arteries of extremities with intermittent claudication, unspecified extremity: Secondary | ICD-10-CM | POA: Diagnosis not present

## 2014-08-13 DIAGNOSIS — M25519 Pain in unspecified shoulder: Secondary | ICD-10-CM | POA: Diagnosis not present

## 2014-08-13 DIAGNOSIS — R52 Pain, unspecified: Secondary | ICD-10-CM

## 2014-08-13 DIAGNOSIS — G629 Polyneuropathy, unspecified: Secondary | ICD-10-CM

## 2014-08-13 NOTE — Progress Notes (Signed)
   Subjective:    Patient ID: [name], female    DOB: 26-Apr-1931, 80 y.o.   MRN: [id number]  HPI   80 year old female presents the office today with her husband for a second opinion for bilateral foot pain, neuropathy. She has a history of fibromyalgia, Parkinson's disease, Sjogrens disease, and multiple lumbar spine surgeis.  She has had an extensive work up for the neuropathy although they do not have the results of the studies. States that her foot is more symptomatic than her left. She previously had multiple fractures left foot about 4-5 years ago. She states that she has been told that she has neuropathy. She complains of a tingling cold sensation to her feet. Also feels like there is "water dripping" on her feet. She has generalized pain over of the forefoot bilaterally, into the lateral aspect of the right foot. She previously has been seen by other podiatrists, vascular surgery, neurology. Denies any recent trauma to the area. No other complaints at this time.   Review of Systems  Constitutional: Positive for fatigue.  HENT: Positive for hearing loss, sinus pressure and sneezing.   Eyes: Positive for itching.  Gastrointestinal: Positive for constipation.  Endocrine:       EXCESSIVE THIRST  Musculoskeletal: Positive for back pain and gait problem.       JOINT PAIN AND MUSCLE PAIN  Allergic/Immunologic: Positive for food allergies.  Neurological: Positive for tremors and numbness.  Hematological: Bruises/bleeds easily.  Psychiatric/Behavioral: The patient is nervous/anxious.   All other systems reviewed and are negative.      Objective:   Physical Exam AAO x3, NAD DP pulses palpable b/l, PT decreased. CRT < 3sec. Skin temperature equal bilaterally.  Protective sensation decreased with Derrel Nip monofilament, vibratory sensation decreased, Achilles tendon reflex intact Overall rectus foot type with the left having a mild decrease in medial arch height. There is  limitation of the 1st MPTJ ROM b/l L>R. Mild discomfort upon palpation over the right 5th metatarsal . No pain with vibration over the area. Peroneal tendon appears intact. No other areas of specific pinpoint tenderness.  Left foot/ankle without specific areas of tenderness. No pinpoint bony tenderness.  Prominent metatarsal heads plantarly with loss of fat pad. MMT 5/5, ROM WNL.  Hyperkerotic lesion left submetatarsal 1  No open lesions.  No leg pain/warmth/edema.        Assessment & Plan:  80 year old female bilateral foot pain, neuropathy. -X-rays were obtained and reviewed with the patient. - Various treatment options were discussed with the patient including alternatives, risks, complications. -Recommend followup in neurology for neuropathy as her symptoms could be related to her other medical problems. Possibly correlated to her spinal surgeries as well. Patient started been seen by other specialties such as vascular surgery -Discussed supportive shoe gear. -Hyperkeratotic lesions sharply debrided without complications. -Dispensed metatarsal pads. -Followup in 1 monthor if there are any change in symptoms. Call with any questions or concerns.

## 2014-08-16 DIAGNOSIS — M25519 Pain in unspecified shoulder: Secondary | ICD-10-CM | POA: Diagnosis not present

## 2014-08-16 DIAGNOSIS — R269 Unspecified abnormalities of gait and mobility: Secondary | ICD-10-CM | POA: Diagnosis not present

## 2014-08-17 DIAGNOSIS — R269 Unspecified abnormalities of gait and mobility: Secondary | ICD-10-CM | POA: Diagnosis not present

## 2014-08-17 DIAGNOSIS — M25519 Pain in unspecified shoulder: Secondary | ICD-10-CM | POA: Diagnosis not present

## 2014-08-23 DIAGNOSIS — R269 Unspecified abnormalities of gait and mobility: Secondary | ICD-10-CM | POA: Diagnosis not present

## 2014-08-23 DIAGNOSIS — M25519 Pain in unspecified shoulder: Secondary | ICD-10-CM | POA: Diagnosis not present

## 2014-08-24 DIAGNOSIS — M47817 Spondylosis without myelopathy or radiculopathy, lumbosacral region: Secondary | ICD-10-CM | POA: Diagnosis not present

## 2014-08-24 DIAGNOSIS — M479 Spondylosis, unspecified: Secondary | ICD-10-CM | POA: Diagnosis not present

## 2014-08-27 DIAGNOSIS — M25519 Pain in unspecified shoulder: Secondary | ICD-10-CM | POA: Diagnosis not present

## 2014-08-27 DIAGNOSIS — R269 Unspecified abnormalities of gait and mobility: Secondary | ICD-10-CM | POA: Diagnosis not present

## 2014-08-31 DIAGNOSIS — G609 Hereditary and idiopathic neuropathy, unspecified: Secondary | ICD-10-CM | POA: Diagnosis not present

## 2014-08-31 DIAGNOSIS — M549 Dorsalgia, unspecified: Secondary | ICD-10-CM | POA: Diagnosis not present

## 2014-08-31 DIAGNOSIS — R269 Unspecified abnormalities of gait and mobility: Secondary | ICD-10-CM | POA: Diagnosis not present

## 2014-08-31 DIAGNOSIS — M545 Low back pain, unspecified: Secondary | ICD-10-CM | POA: Diagnosis not present

## 2014-08-31 DIAGNOSIS — M25519 Pain in unspecified shoulder: Secondary | ICD-10-CM | POA: Diagnosis not present

## 2014-08-31 DIAGNOSIS — IMO0002 Reserved for concepts with insufficient information to code with codable children: Secondary | ICD-10-CM | POA: Diagnosis not present

## 2014-09-03 ENCOUNTER — Ambulatory Visit (INDEPENDENT_AMBULATORY_CARE_PROVIDER_SITE_OTHER): Payer: Medicare Other | Admitting: Diagnostic Neuroimaging

## 2014-09-03 ENCOUNTER — Encounter: Payer: Self-pay | Admitting: Diagnostic Neuroimaging

## 2014-09-03 VITALS — BP 119/69 | HR 66 | Ht 59.0 in | Wt 157.0 lb

## 2014-09-03 DIAGNOSIS — G20 Parkinson's disease: Secondary | ICD-10-CM

## 2014-09-03 DIAGNOSIS — R1319 Other dysphagia: Secondary | ICD-10-CM

## 2014-09-03 DIAGNOSIS — G609 Hereditary and idiopathic neuropathy, unspecified: Secondary | ICD-10-CM | POA: Diagnosis not present

## 2014-09-03 DIAGNOSIS — I70219 Atherosclerosis of native arteries of extremities with intermittent claudication, unspecified extremity: Secondary | ICD-10-CM

## 2014-09-03 NOTE — Progress Notes (Signed)
[facility]  PATIENT: [name] DOB: 11/18/1931  REFERRING CLINICIAN:  HISTORY FROM: patient and husband REASON FOR VISIT: follow up   HISTORICAL  CHIEF COMPLAINT:  Chief Complaint  Patient presents with  . Follow-up    PD    HISTORY OF PRESENT ILLNESS:   UPDATE 09/03/14: Since last visit, tremor is well controlled. Gait and freezing are stable,. Doing well on (carb/levo 1.5 tabs + pramipexole 1mg ) taken TID (sometimes QID, with extra late evening dose if wearing off is notable). Having more foot pain, numbness, drooling prob, flushing, anxiety. Saw psychology last visit, then rec to see psychiatry, but sxs improved and the psychiatrist who was recommended to them was not taking new patients.   UPDATE 02/24/14: Since last visit, has increased carb/levo up to 1 tab 4x per day; has also incr pramipexole up to 1mg  4x per day (for convenience of remembering, even though we normally dose BID or TID). Also with more anxiety, decr confidence, more tremor in right upper ext. Also reports some right shoulder, bilateral knee, low back pain. Has noticed some freezing of gait. Has appt with psychology later today.  UPDATE 08/26/13: Since last visit, noticing some wearing off in early evening. Takes carb/levo 6:30am, noon, 9pm. Also getting more "worked up" lately, reading about PD online. No falls. Uses cane and walker. More fatigue and mental exhaustion.  UPDATE 02/04/13: Since last visit, has had some minor falls. tremor and PD sxs better on carb/levo. No anxiety. Some drooling and hypophonia. Not using walker or cane all the time. constipation stable.  UPDATE 10/06/12: Since last visit, had another back surgery (april 2013), dx'd with CHF, dx'd with sjogrens, also with fall in August 2013 with pelvic fracture. Reports good tremor control. Some freezing of gait.  UPDATE 02/13/12: Now on pramipexole 1mg  QID (7am, noon, 5pm, 10pm). Some wearing off around 5pm. Also, had  rxn to for[facility], now with right leg pain. Also getting lumbar radiculopathy eval per ortho.  UPDATE 08/14/11: Tremor developing around 4pm now.  More anxiety per husband.  Also with UTI x 2.  Now dx'd with left foot fractures (? "stubbed" toes; no falls).  UPDATE 04/27/11: Tolerating pramipexole 1mg  TID (7am, 1-2pm, 7p).  some wearing off right at 7p.  feels unsteady, and has ongoing back problems and pain.  UPDATE 01/24/11: Doing slightly better with pramipexole 1mg  BID.  Tremor returns later in the day before evening dose.  Still with gait and balance diff.    PRIOR HPI: 80 year old female with history of hypertension, hypercholesterolemia, hypothyroidism, fibromyalgia, restless leg syndrome, presenting for evaluation of tremor for the past 3 years. She is here with her husband. Approximately 3 years ago patient developed intermittent tremor in her left hand particularly in her thumb and fingers.  Tremor is most noticeable at rest and in the evening.  In June 2011, patient tripped, fell and broke her left wrist and forearm.  This was repaired surgically and in a cast; during this time and since then her tremor has improved. Patient also has history of restless leg syndrome for the past 5 years. She's been on pramipexole for number of years (0.5mg  qhs) and recently increased to 1 mg qhs a few weeks ago.  The patient's mother also had restless leg syndrome. Patient has a diagnosis of iron deficiency anemia but is not on iron replacement.  REVIEW OF SYSTEMS: Full 14 system review of systems performed and notable only for decreased appetite hearing loss trouble swallowing  drooling eye itching like since to the shortness of breath leg swelling restless leg daytime sleepiness constipation food allergy joint pain joint swelling back pain aching muscle walking difficulty tremors depression anxiety.   ALLERGIES: Allergies  Allergen Reactions  . Codeine     headache  . Lactose Intolerance (Gi)     Reactions to  meds that contain lactose derivitives and foods  . Lyrica [Pregabalin]     Leg edema   . Other Other (See Comments)    Pain meds received after surgery caused nightmares (Percocet,Hydrocodone,Morphine)  . Plaquenil [Hydroxychloroquine Sulfate]     Stomach upset  . Reglan [Metoclopramide] Other (See Comments)    Stomach upset  . Requip [Ropinirole Hcl]     Stomach upset   . Septra [Sulfamethoxazole-Tmp Ds] Nausea Only    HOME MEDICATIONS: Outpatient Prescriptions Prior to Visit  Medication Sig Dispense Refill  . carbidopa-levodopa (SINEMET IR) 25-100 MG per tablet Take 1.5 tablets by mouth 4 (four) times daily.  180 tablet  12  . CELEBREX 200 MG capsule 200 mg 2 (two) times daily.       Marland Kitchen dexlansoprazole (DEXILANT) 60 MG capsule Take 60 mg by mouth daily.      Marland Kitchen diltiazem (CARDIZEM CD) 120 MG 24 hr capsule TAKE (1) CAPSULE DAILY.  90 capsule  5  . ezetimibe (ZETIA) 10 MG tablet Take 10 mg by mouth every evening.      . furosemide (LASIX) 20 MG tablet TAKE 1 TABLET ONCE DAILY.  90 tablet  3  . levothyroxine (SYNTHROID, LEVOTHROID) 50 MCG tablet Take 50 mcg by mouth daily.      Marland Kitchen lovastatin (MEVACOR) 40 MG tablet Take 40 mg by mouth at bedtime.      . metoprolol tartrate (LOPRESSOR) 25 MG tablet TAKE 1 TABLET ONCE DAILY.  30 tablet  2  . pramipexole (MIRAPEX) 1 MG tablet Take 1 tablet (1 mg total) by mouth 4 (four) times daily.  120 tablet  12  . traMADol (ULTRAM) 50 MG tablet Take 50 mg by mouth 3 (three) times daily as needed. for pain      . traMADol (ULTRAM-ER) 200 MG 24 hr tablet Take 200 mg by mouth daily.      Marland Kitchen albuterol (PROVENTIL HFA;VENTOLIN HFA) 108 (90 BASE) MCG/ACT inhaler Inhale 2 puffs into the lungs every 6 (six) hours as needed for wheezing or shortness of breath.  1 Inhaler  6  . ampicillin (PRINCIPEN) 500 MG capsule       . Calcium Carbonate (CALCIUM 500 PO) Take 1,000 mg by mouth daily.      . Cholecalciferol (VITAMIN D) 2000 UNITS tablet Take 2,000 Units by mouth  daily.      Marland Kitchen docusate sodium (COLACE) 100 MG capsule Take 100 mg by mouth at bedtime.      . Multiple Vitamin (MULITIVITAMIN WITH MINERALS) TABS Take 1 tablet by mouth daily.      . polyethylene glycol (MIRALAX / GLYCOLAX) packet Take 17 g by mouth daily as needed.       . potassium chloride (K-DUR) 10 MEQ tablet TAKE 1 TABLET ONCE DAILY.  90 tablet  3  . PREMARIN vaginal cream        No facility-administered medications prior to visit.    PAST MEDICAL HISTORY: Past Medical History  Diagnosis Date  . Hypothyroidism   . Shortness of breath   . H/O hiatal hernia   . Arthritis   . Anxiety   . Neuropathy   .  Tremor   . Complication of anesthesia     nigthmares, hallucinations   . Anemia   . Osteoporosis   . CHF (congestive heart failure)   . Parkinson disease   . Fibromyalgia   . Restless leg   . Sjogren's disease   . Hypertension     dr t turner  . GERD (gastroesophageal reflux disease)   . Hypercholesterolemia   . Diastolic dysfunction   . SOB (shortness of breath)     chronic due to diastolic dysfunction, deconditioning, obesity  . PVD (peripheral vascular disease)     99% stenosis of left anteiror tibial artery, mod stenosis of left distal SFA and popliteal artery followed by [name]  . Barrett esophagus   . Chronic diastolic CHF (congestive heart failure)     PAST SURGICAL HISTORY: Past Surgical History  Procedure Laterality Date  . Abdominal hysterectomy    . Back surgery      3 back surgeries,   lumbar fusion  . Wrist fracture surgery    . Eye surgery      cateracts  . Falls      variious fall, broken wrist,and toes  . Total hip arthroplasty      right  . Cardiac catheterization  2006    normal  . Spine surgery  April 2013    Back X's 4    FAMILY HISTORY: Family History  Problem Relation Age of Onset  . Heart attack Mother   . Restless legs syndrome Mother   . Heart failure Mother   . Heart disease Mother   . Hypertension Mother   . COPD  Father     SOCIAL HISTORY:  History   Social History  . Marital Status: Married    Spouse Name: [name]    Number of Children: 2  . Years of Education: HS   Occupational History  .      Homemaker   Social History Main Topics  . Smoking status: Never Smoker   . Smokeless tobacco: Never Used  . Alcohol Use: No  . Drug Use: No  . Sexual Activity: No   Other Topics Concern  . Not on file   Social History Narrative   Patient lives at home with her spouse.   Caffeine Use: tea     PHYSICAL EXAM  Filed Vitals:   09/03/14 0821  BP: 119/69  Pulse: 66  Height: 4\' 11"  (1.499 m)  Weight: 157 lb (71.215 kg)    Not recorded    Body mass index is 31.69 kg/(m^2).  GENERAL EXAM: General: Patient is awake, alert and in no acute distress.  Well developed and groomed.  MASKED FACIES. Neck: Neck is supple. Cardiovascular: Heart is regular rate and rhythm with no murmurs.  Neurologic Exam  Mental Status: Awake, alert.  Language is fluent and comprehension intact. Cranial Nerves: Pupils are equal and reactive to light.  Visual fields are full to confrontation.  Conjugate eye movements are full and symmetric.  Facial sensation and strength are symmetric.  Hearing is intact.  Palate elevated symmetrically and uvula is midline.  Shoulder shug is symmetric.  Tongue is midline. Motor: Normal bulk and tone; MILD COGWHEELING IN LUE  WITH REINFORCEMENT.  NO REST OR POSTURAL TREMOR.  BRADYKINESIA IN BUE AND BLE (L SLOWER THAN R). Full strength in the upper and lower extremities; EXCEPT LIMITED IN RIGHT SHOULDER DUE TO PAIN. No pronator drift. Sensory: Intact and symmetric to light touch. Coordination: No ataxia or dysmetria  on finger-nose or rapid alternating movement testing. Gait and Station: CAUTIOUS GAIT. SLOW TO RISE, AND NEEDS HELP TO STAND UP.  SLOW, DECR ARM SWING. SLOW TURNING. Reflexes: Deep tendon reflexes in the upper extremity are present and symmetric; ABSENT AT KNEES AND  ANKLES.   DIAGNOSTIC DATA (LABS, IMAGING, TESTING) - I reviewed patient records, labs, notes, testing and imaging myself where available.  Lab Results  Component Value Date   WBC 9.9 07/18/2012   HGB 9.8* 07/18/2012   HCT 29.6* 07/18/2012   MCV 88.1 07/18/2012   PLT 289 07/18/2012      Component Value Date/Time   NA 138 04/19/2014 1550   K 4.0 04/19/2014 1550   CL 103 04/19/2014 1550   CO2 27 04/19/2014 1550   GLUCOSE 109* 04/19/2014 1550   BUN 24* 04/19/2014 1550   CREATININE 0.8 04/19/2014 1550   CALCIUM 9.6 04/19/2014 1550   PROT 6.5 05/22/2010 0923   ALBUMIN 3.9 05/22/2010 0923   AST 29 05/22/2010 0923   ALT 22 05/22/2010 0923   ALKPHOS 77 05/22/2010 0923   BILITOT 0.2* 05/22/2010 0923   GFRNONAA 81* 03/14/2012 0857   GFRAA >90 03/14/2012 0857   No results found for this basename: CHOL,  HDL,  LDLCALC,  LDLDIRECT,  TRIG,  CHOLHDL   No results found for this basename: HGBA1C   No results found for this basename: VITAMINB12   No results found for this basename: TSH    11/10/10 MRI brain - mild atrophy and minimal chronic small vessel ischemic disease.   ASSESSMENT AND PLAN  80 y.o. female with hypertension, hypercholesterolemia, hypothyroidism, fibromyalgia, restless leg syndrome and parkinson's disease.  Some wearing off in early evening. More drooling, foot pain (neuropathy), dysphagia.   Dx: idiopathic parkinson's disease (akinetic/rigid) + RLS  PLAN: 1. Continue pramipexole 1mg  TID-QID 2. Continue carb/levo 1.5 tabs TID-QID 3. Continue using rolling walker, stretching and physical therapy exercises 4. Try compounded neuropathy cream 5. Speech therapy eval for dysphagia  Return in about 6 months (around 03/04/2015).    [name], MD 0/17/5102, 5:85 AM Certified in Neurology, Neurophysiology and Neuroimaging  [facility] [address], [place], [place] [address] [phone]

## 2014-09-03 NOTE — Patient Instructions (Signed)
Try speech therapy for swallowing issues.  Try compounded neuropathy cream.

## 2014-09-07 ENCOUNTER — Telehealth: Payer: Self-pay | Admitting: Diagnostic Neuroimaging

## 2014-09-07 MED ORDER — DULOXETINE HCL 20 MG PO CPEP: 20.0000 mg | ORAL_CAPSULE | Freq: Every day | ORAL | Status: DC

## 2014-09-07 NOTE — Telephone Encounter (Signed)
I will try her on low dose duloxetine (cymbalta). Rx sent in. Let patient know. -VRP

## 2014-09-07 NOTE — Telephone Encounter (Signed)
Patient's husband calling to state that they would like to get the pill version of the cream that patient was given, please return call and advise.

## 2014-09-07 NOTE — Telephone Encounter (Signed)
I called back.  Mr [name] said they would prefer that the patient take a tablet that may help with pain/occasional depression instead of using the compound cream at this time.  Please advise.  Thank you.

## 2014-09-07 NOTE — Telephone Encounter (Signed)
I called back.  Got no answer, left message relaying Dr Gladstone Lighter note.  Asked them to call back if needed.

## 2014-09-09 DIAGNOSIS — R1312 Dysphagia, oropharyngeal phase: Secondary | ICD-10-CM | POA: Diagnosis not present

## 2014-09-09 DIAGNOSIS — R41841 Cognitive communication deficit: Secondary | ICD-10-CM | POA: Diagnosis not present

## 2014-09-10 DIAGNOSIS — N200 Calculus of kidney: Secondary | ICD-10-CM | POA: Diagnosis not present

## 2014-09-13 DIAGNOSIS — R41841 Cognitive communication deficit: Secondary | ICD-10-CM | POA: Diagnosis not present

## 2014-09-13 DIAGNOSIS — R1312 Dysphagia, oropharyngeal phase: Secondary | ICD-10-CM | POA: Diagnosis not present

## 2014-09-15 ENCOUNTER — Encounter: Payer: Self-pay | Admitting: Podiatry

## 2014-09-15 ENCOUNTER — Ambulatory Visit (INDEPENDENT_AMBULATORY_CARE_PROVIDER_SITE_OTHER): Payer: Medicare Other | Admitting: Podiatry

## 2014-09-15 ENCOUNTER — Ambulatory Visit (INDEPENDENT_AMBULATORY_CARE_PROVIDER_SITE_OTHER): Payer: Medicare Other

## 2014-09-15 VITALS — BP 126/70 | HR 64 | Resp 14

## 2014-09-15 DIAGNOSIS — M2040 Other hammer toe(s) (acquired), unspecified foot: Secondary | ICD-10-CM | POA: Diagnosis not present

## 2014-09-15 DIAGNOSIS — I70219 Atherosclerosis of native arteries of extremities with intermittent claudication, unspecified extremity: Secondary | ICD-10-CM

## 2014-09-15 DIAGNOSIS — M79671 Pain in right foot: Secondary | ICD-10-CM | POA: Diagnosis not present

## 2014-09-15 DIAGNOSIS — L600 Ingrowing nail: Secondary | ICD-10-CM | POA: Diagnosis not present

## 2014-09-15 DIAGNOSIS — R52 Pain, unspecified: Secondary | ICD-10-CM

## 2014-09-15 MED ORDER — CEPHALEXIN 500 MG PO CAPS: 500.0000 mg | ORAL_CAPSULE | Freq: Three times a day (TID) | ORAL | Status: DC

## 2014-09-15 NOTE — Patient Instructions (Signed)
Monitor for any signs/symptoms of infection. Call the office immediately if any occur or go directly to the emergency room. Call with any questions/concerns.  

## 2014-09-15 NOTE — Progress Notes (Signed)
Patient ID: [name], female   DOB: 13-Nov-1931, 80 y.o.   MRN: [id number]  Subjective: [name], 80 year old female, turns the office they for followup evaluation for right foot pain. She states limited to ambulate without difficulty but has continued mild pain on the outside aspect of her foot. She denies any recent injury her, to the area. She has new complaints today of right big toe pain. She says been ongoing for approximately one week. She denies any drainage from the site has noticed some redness. Denies any systemic complaints as fevers, chills, nausea, vomiting. No other complaints at this time.  Objective: AAO x3, NAD DP pulses palpable b/l, PT pulses decreased. CRT < 3 sec Protective sensation decreased with Simms Weinstein monofilament, vibratory sensation decreased, Achilles tendon reflex intact. Mild tenderness over the fifth metatarsal base. No pain on the course of the peroneal tendons. No pain with range of motion. Overlying skin intact. Right medial hallux with lifting of the nail for Nail bed with debris in the medial nail border. Mild localized erythema around the medial aspect. No drainage, purulence. No malodor. Mild tenderness of the medial nail border. Bilateral hammertoe contractures of lesser digits. Pre-ulcerative lesion over the left fourth PIPJ. MMT 5/5, ROM WNL No calf pain, swelling, warmth.  Assessment: 80 year old female with right fifth metatarsal pain, right medial hallux ingrown toenail/likely onychomycosis, hammertoe contractures with pre-ulcerative lesion left fourth digit.  Plan: -X-rays were obtained and reviewed the patient. At his x-rays there is a small radiolucent area of the lateral aspect of the fifth metatarsal base. Discussed with the same area last visit. -Patient states that she is ambulating without much difficulty. She again declines any kind of immobilization. -Right medial hallux nail sharply debrided without complications. The  medial nail border was debrided and a curette was utilized to debride the area. He is mild localized erythema on the medial nail border however no purulence or other clinical signs of infection noted. Discussed with her that if symptoms continue may need to proceed with a partial nail avulsion. Be held off at today's visit the soft tissues relief from this nail debridement as she states that she had a left hallux nail removed it took a long time to heal. Rx Keflex. -Dispensed offloading pads to wear over left fourth toe pre-ulcerative site. -Followup in 10 days or sooner if any problems are to arise or any change in symptoms. In the meantime call any questions, concerns. Monitor for any signs or symptoms of infection and directed to call the office immediately if any to occur or go directly to the emergency room.

## 2014-09-20 DIAGNOSIS — R41841 Cognitive communication deficit: Secondary | ICD-10-CM | POA: Diagnosis not present

## 2014-09-20 DIAGNOSIS — R1312 Dysphagia, oropharyngeal phase: Secondary | ICD-10-CM | POA: Diagnosis not present

## 2014-09-29 ENCOUNTER — Ambulatory Visit (INDEPENDENT_AMBULATORY_CARE_PROVIDER_SITE_OTHER): Payer: Medicare Other | Admitting: Podiatry

## 2014-09-29 ENCOUNTER — Encounter: Payer: Self-pay | Admitting: Podiatry

## 2014-09-29 VITALS — BP 109/67 | HR 72 | Resp 16

## 2014-09-29 DIAGNOSIS — I70219 Atherosclerosis of native arteries of extremities with intermittent claudication, unspecified extremity: Secondary | ICD-10-CM | POA: Diagnosis not present

## 2014-09-29 DIAGNOSIS — M2040 Other hammer toe(s) (acquired), unspecified foot: Secondary | ICD-10-CM

## 2014-09-29 DIAGNOSIS — R52 Pain, unspecified: Secondary | ICD-10-CM

## 2014-09-29 DIAGNOSIS — G629 Polyneuropathy, unspecified: Secondary | ICD-10-CM

## 2014-09-29 NOTE — Patient Instructions (Signed)
Monitor for any signs/symptoms of infection. Call the office immediately if any occur or go directly to the emergency room. Call with any questions/concerns. Call with any questions, concerns, change in symptoms.

## 2014-09-30 NOTE — Progress Notes (Signed)
Patient ID: [name], female   DOB: 13-Jun-1931, 80 y.o.   MRN: [id number]  [name] returns the office they with her husband for followup evaluation of right medial hallux ingrown toenail, right fifth metatarsal pain. Today she also states that she has some pain on the left aspect of the big toe. Since last appointment she is finished her course of antibiotics. She states that she continues to have mild intermittent discomfort over the lateral aspect of the right foot. She states it feels more like an intermittent sharp/numbness over the area. States that she was prescribed medication by her neurologist for neuropathy. She denies any acute changes since last appointment. No other complaints at this time.  Objective AAO x3, NAD, presents today wearing a flat shoe with narrow toebox DP pulses palpable bilaterally, PT pulses decreased, CRT less than 3 seconds Protective sensation decreased with Simms Weinstein monofilament, decreased vibratory sensation, Achilles tendon reflex intact. Mild continued discomfort over the lateral aspect of the the foot and fifth metatarsal. There is no pain along the course of the peroneal tendons and no pain with range of motion. There is no overlying edema, erythema, increased warmth. Right medial hallux status post debridement without any surrounding erythema, drainage, ascending cellulitis, malodor. No tenderness to palpation over the area. Left distal aspect of the hallux with slight localized erythema. This appears to be likely from pressure. Subjectively the patient and her husband state that this area becomes intermittently red over several years. There is no ascending cellulitis, increased warmth, pain, open lesion associated. Hammertoe contractures with pre-ulcerative lesion on the dorsal aspect the left fourth digit. MMT 5/5, ROM WNL No calf pain, swelling, warmth  Assessment: 80 year old female with neuropathy and continued right lateral foot  pain/fifth metatarsal pain, resolved erythema of the right medial hallux, pre-ulcerative lesion dorsal left fourth digit due to hammertoe contracture, localized erythema left distal hallux likely due to pressure from shoe gear.  Plan: -Conservative versus surgical treatment discussed including alternatives, risks, complications. -Pain on the lateral aspect of the right foot appears to be intermittent and without change from prior appointment. Continue to recommend some kind of immobilization however patient does not want to.  -Continue with offloading pads for the left fourth digit -Right medial hallux erythema has resulted no signs of infection. -Left distal hallux localized erythema appears to be likely from pressure from shoe gear. Discussed with the patient proper shoe gear and she would likely benefit from a wider toe box. -Followup in 2 months or sooner if any problems are to arise or any change in symptoms. In the meantime call the office with any questions, concerns.

## 2014-10-04 DIAGNOSIS — M47816 Spondylosis without myelopathy or radiculopathy, lumbar region: Secondary | ICD-10-CM | POA: Diagnosis not present

## 2014-10-13 DIAGNOSIS — N200 Calculus of kidney: Secondary | ICD-10-CM | POA: Diagnosis not present

## 2014-10-13 DIAGNOSIS — M47816 Spondylosis without myelopathy or radiculopathy, lumbar region: Secondary | ICD-10-CM | POA: Diagnosis not present

## 2014-10-13 DIAGNOSIS — M797 Fibromyalgia: Secondary | ICD-10-CM | POA: Diagnosis not present

## 2014-10-13 DIAGNOSIS — E782 Mixed hyperlipidemia: Secondary | ICD-10-CM | POA: Diagnosis not present

## 2014-10-13 DIAGNOSIS — I10 Essential (primary) hypertension: Secondary | ICD-10-CM | POA: Diagnosis not present

## 2014-10-13 DIAGNOSIS — E039 Hypothyroidism, unspecified: Secondary | ICD-10-CM | POA: Diagnosis not present

## 2014-10-14 DIAGNOSIS — H903 Sensorineural hearing loss, bilateral: Secondary | ICD-10-CM | POA: Diagnosis not present

## 2014-10-27 ENCOUNTER — Encounter: Payer: Self-pay | Admitting: Cardiology

## 2014-10-27 ENCOUNTER — Ambulatory Visit (INDEPENDENT_AMBULATORY_CARE_PROVIDER_SITE_OTHER): Payer: Medicare Other | Admitting: Cardiology

## 2014-10-27 VITALS — BP 128/64 | HR 67 | Ht 59.0 in | Wt 158.0 lb

## 2014-10-27 DIAGNOSIS — I10 Essential (primary) hypertension: Secondary | ICD-10-CM | POA: Diagnosis not present

## 2014-10-27 DIAGNOSIS — R0602 Shortness of breath: Secondary | ICD-10-CM | POA: Diagnosis not present

## 2014-10-27 DIAGNOSIS — I5032 Chronic diastolic (congestive) heart failure: Secondary | ICD-10-CM | POA: Diagnosis not present

## 2014-10-27 DIAGNOSIS — I70219 Atherosclerosis of native arteries of extremities with intermittent claudication, unspecified extremity: Secondary | ICD-10-CM | POA: Diagnosis not present

## 2014-10-27 LAB — BASIC METABOLIC PANEL
BUN: 21 mg/dL (ref 6–23)
CO2: 26 mEq/L (ref 19–32)
Calcium: 9.2 mg/dL (ref 8.4–10.5)
Chloride: 110 mEq/L (ref 96–112)
Creatinine, Ser: 0.7 mg/dL (ref 0.4–1.2)
GFR: 82.61 mL/min (ref >60.00)
Glucose, Bld: 90 mg/dL (ref 70–99)
Potassium: 4.4 mEq/L (ref 3.5–5.1)
Sodium: 143 mEq/L (ref 135–145)

## 2014-10-27 NOTE — Progress Notes (Signed)
[facility] [place], [place], [place]  [address] Phone: [phone] Fax:  [phone]  Date:  10/27/2014   ID:  [name], DOB 09/21/1931, MRN [id number]  PCP:  [name], MD  Cardiologist:  Fransico Him, MD    History of Present Illness: [name] is a 80 y.o. female with a history of chronic SOB secondary to diastolic dysfunction, chronic diastolic CHF, deconditioning and obesity. She has had chronic SOB for years and a heart cath in 2006 showed normal coronary arteries and normal LVF with normal LVEDP. She also has a history of HTN, chronic LE edema and PVD followed by [name]. She is doing well from a cardiac standpoint. She denies any chest pain, dizziness, palpitations or syncope. She has chronic LE edema which comes and goes but actually has improved some..  She has chronic SOB which is stable and has not changed any since I saw her last.   Wt Readings from Last 3 Encounters:  10/27/14 158 lb (71.668 kg)  09/03/14 157 lb (71.215 kg)  05/27/14 155 lb (70.308 kg)     Past Medical History  Diagnosis Date  . Hypothyroidism   . Shortness of breath   . H/O hiatal hernia   . Arthritis   . Anxiety   . Neuropathy   . Tremor   . Complication of anesthesia     nigthmares, hallucinations   . Anemia   . Osteoporosis   . CHF (congestive heart failure)   . Parkinson disease   . Fibromyalgia   . Restless leg   . Sjogren's disease   . Hypertension     dr t Paitynn Mikus  . GERD (gastroesophageal reflux disease)   . Hypercholesterolemia   . Diastolic dysfunction   . SOB (shortness of breath)     chronic due to diastolic dysfunction, deconditioning, obesity  . PVD (peripheral vascular disease)     99% stenosis of left anteiror tibial artery, mod stenosis of left distal SFA and popliteal artery followed by [name]  . Barrett esophagus   . Chronic diastolic CHF (congestive heart failure)     Current Outpatient Prescriptions  Medication Sig  Dispense Refill  . carbidopa-levodopa (SINEMET IR) 25-100 MG per tablet Take 1.5 tablets by mouth 4 (four) times daily. 180 tablet 12  . CELEBREX 200 MG capsule 200 mg 2 (two) times daily.     Marland Kitchen dexlansoprazole (DEXILANT) 60 MG capsule Take 60 mg by mouth daily.    Marland Kitchen diltiazem (CARDIZEM CD) 120 MG 24 hr capsule TAKE (1) CAPSULE DAILY. 90 capsule 5  . ezetimibe (ZETIA) 10 MG tablet Take 10 mg by mouth every evening.    . furosemide (LASIX) 20 MG tablet TAKE 1 TABLET ONCE DAILY. 90 tablet 3  . levothyroxine (SYNTHROID, LEVOTHROID) 50 MCG tablet Take 50 mcg by mouth daily.    Marland Kitchen lovastatin (MEVACOR) 40 MG tablet Take 40 mg by mouth at bedtime.    . metoprolol tartrate (LOPRESSOR) 25 MG tablet TAKE 1 TABLET ONCE DAILY. 30 tablet 2  . potassium chloride (K-DUR) 10 MEQ tablet     . pramipexole (MIRAPEX) 1 MG tablet Take 1 tablet (1 mg total) by mouth 4 (four) times daily. 120 tablet 12  . traMADol (ULTRAM) 50 MG tablet Take 50 mg by mouth 3 (three) times daily as needed. for pain    . traMADol (ULTRAM-ER) 200 MG 24 hr tablet Take 200 mg by mouth daily.     No current facility-administered  medications for this visit.    Allergies:    Allergies  Allergen Reactions  . Codeine     headache  . Lactose Intolerance (Gi)     Reactions to meds that contain lactose derivitives and foods  . Lyrica [Pregabalin]     Leg edema   . Other Other (See Comments)    Pain meds received after surgery caused nightmares (Percocet,Hydrocodone,Morphine)  . Plaquenil [Hydroxychloroquine Sulfate]     Stomach upset  . Reglan [Metoclopramide] Other (See Comments)    Stomach upset  . Requip [Ropinirole Hcl]     Stomach upset   . Septra [Sulfamethoxazole-Trimethoprim] Nausea Only    Social History:  The patient  reports that she has never smoked. She has never used smokeless tobacco. She reports that she does not drink alcohol or use illicit drugs.   Family History:  The patient's family history includes COPD in  her father; Heart attack in her mother; Heart disease in her mother; Heart failure in her mother; Hypertension in her mother; Restless legs syndrome in her mother.   ROS:  Please see the history of present illness.      All other systems reviewed and negative.   PHYSICAL EXAM: VS:  BP 128/64 mmHg  Pulse 67  Ht 4\' 11"  (1.499 m)  Wt 158 lb (71.668 kg)  BMI 31.89 kg/m2 Well nourished, well developed, in no acute distress HEENT: normal Neck: no JVD Cardiac:  normal S1, S2; RRR; no murmur Lungs:  clear to auscultation bilaterally, no wheezing, rhonchi or rales Abd: soft, nontender, no hepatomegaly Ext: no edema Skin: warm and dry Neuro:  CNs 2-12 intact, no focal abnormalities noted  EKG:     NSR with no ST changes  ASSESSMENT AND PLAN:  1. Chronic diastolic CHF - continue Lasix/beta blocker  - check BMET 2. Chronic SOB multifactorial from obesity, deconditioning and diastolic dysfunction. Nuclear stress test 05/2013 with no ischemia. She has had chronic SOB dating back to 2006 at the time of her cath that showed normal coronary arteries, normal LVF and normal LVEDP.  3. HTN - continue Cardizem and Metoprolol    Followup with me in 6 months   Signed, Fransico Him, MD [facility] 10/27/2014 8:42 AM

## 2014-10-27 NOTE — Patient Instructions (Signed)
Your physician recommends that you have lab work TODAY (BMET).   Your physician recommends that you continue on your current medications as directed. Please refer to the Current Medication list given to you today.  Your physician wants you to follow-up in: 6 months with Dr. Turner. You will receive a reminder letter in the mail two months in advance. If you don't receive a letter, please call our office to schedule the follow-up appointment.  

## 2014-10-28 ENCOUNTER — Other Ambulatory Visit: Payer: Self-pay | Admitting: Cardiology

## 2014-10-29 DIAGNOSIS — R300 Dysuria: Secondary | ICD-10-CM | POA: Diagnosis not present

## 2014-10-29 DIAGNOSIS — R350 Frequency of micturition: Secondary | ICD-10-CM | POA: Diagnosis not present

## 2014-11-03 DIAGNOSIS — D72829 Elevated white blood cell count, unspecified: Secondary | ICD-10-CM | POA: Diagnosis not present

## 2014-11-15 DIAGNOSIS — N200 Calculus of kidney: Secondary | ICD-10-CM | POA: Diagnosis not present

## 2014-11-15 DIAGNOSIS — N816 Rectocele: Secondary | ICD-10-CM | POA: Diagnosis not present

## 2014-11-15 DIAGNOSIS — N952 Postmenopausal atrophic vaginitis: Secondary | ICD-10-CM | POA: Diagnosis not present

## 2014-11-15 DIAGNOSIS — N3020 Other chronic cystitis without hematuria: Secondary | ICD-10-CM | POA: Diagnosis not present

## 2014-11-26 ENCOUNTER — Ambulatory Visit (INDEPENDENT_AMBULATORY_CARE_PROVIDER_SITE_OTHER): Payer: Medicare Other | Admitting: Podiatry

## 2014-11-26 ENCOUNTER — Ambulatory Visit (INDEPENDENT_AMBULATORY_CARE_PROVIDER_SITE_OTHER): Payer: Medicare Other

## 2014-11-26 VITALS — BP 110/76 | HR 79 | Resp 16

## 2014-11-26 DIAGNOSIS — M79672 Pain in left foot: Secondary | ICD-10-CM

## 2014-11-26 DIAGNOSIS — L84 Corns and callosities: Secondary | ICD-10-CM

## 2014-11-26 DIAGNOSIS — M545 Low back pain: Secondary | ICD-10-CM | POA: Diagnosis not present

## 2014-11-26 DIAGNOSIS — M47816 Spondylosis without myelopathy or radiculopathy, lumbar region: Secondary | ICD-10-CM | POA: Diagnosis not present

## 2014-11-26 DIAGNOSIS — M205X9 Other deformities of toe(s) (acquired), unspecified foot: Secondary | ICD-10-CM

## 2014-11-26 DIAGNOSIS — M779 Enthesopathy, unspecified: Secondary | ICD-10-CM

## 2014-11-26 DIAGNOSIS — I70219 Atherosclerosis of native arteries of extremities with intermittent claudication, unspecified extremity: Secondary | ICD-10-CM | POA: Diagnosis not present

## 2014-11-29 NOTE — Progress Notes (Signed)
Patient ID: [name], female   DOB: [birth year], 80 y.o.   MRN: [id number]  Subjective: 80 year old female returns to the office today with her husband for complaints of left big toe pain. The patient states that she is unable to identify the area is coming from around the toenail. However, she points to the first MTPJ. She states the left is greater than right. She denies any recent redness around the area. She denies any recent injury or trauma. No other complaints at this time and no acute changes since last. The patient did undergo multiple injections today and she is unsure of fourth was injected.  Objective: AAO 3, NAD DP/PT pulses palpable bilaterally, CRT less than 3 seconds Protective sensation decreased with Sims once the monofilament, decrease her return sensation, Achilles tendon reflex intact. There is mild discomfort overlying left first MTPJ. There is trace erythema overlying the first MTPJ medially likely from pressure on the left. There is a mild bunion deformity identified bilaterally. There is mild discomfort with first MTPJ range of motion bilaterally left greater than right. There is no specific area pinpoint bony tenderness or pain with vibratory sensation overlying the hallux with a metatarsal. There is not appear to be any discomfort along the nails themselves. There is no drainage or signs of infection around the nails. There is no overlying edema, increase in warmth to bilateral first MTPJ/feet.  Mild discomfort overlying the left distal hallux overlying the area of hyperkeratotic tissue. Upon debridement underlying ulceration or clinical signs of infection. No other areas of tenderness. No open lesions.  No pain with calf compression, swelling, warmth, erythema.  Assessment: 80 year old female with left first MTPJ pain/hallux limitus, HAV; symptomatic hyperkerotic lesion  Plan: -X-rays were obtained and reviewed of the left foot. -Treatment options were  discussed including alternatives, risks, complications. -At this time recommended a steroid injection into the area however as she's had multiple injections today for other issues will hold off a steroid injection at this time as I'm unsure of what was injected previously. -Dispensed offloading pad to protect the first MTPJ. -Right hallux nail shop and debrided without complications. No signs of infection around the nail. -Left distal hallux hyperkeratotic lesion sharply debrided without complications to patient comfort. -Discussed the patient that if the area remains symptomatic to follow-up next week for steroid injection into the area before the holiday. -Continue to monitor for any clinical signs or symptoms of infection and directed to call the office immediately should any occur or go directly to the emergency room. Follow-up in 1 week if the symptoms of not resolved or sooner if any palms are to arise. Otherwise, follow-up for regularly scheduled appointment if symptoms resolve.

## 2014-12-08 DIAGNOSIS — R309 Painful micturition, unspecified: Secondary | ICD-10-CM | POA: Diagnosis not present

## 2014-12-08 DIAGNOSIS — N200 Calculus of kidney: Secondary | ICD-10-CM | POA: Diagnosis not present

## 2014-12-14 DIAGNOSIS — N200 Calculus of kidney: Secondary | ICD-10-CM | POA: Diagnosis not present

## 2014-12-15 ENCOUNTER — Encounter: Payer: Self-pay | Admitting: Podiatry

## 2014-12-15 ENCOUNTER — Telehealth: Payer: Self-pay

## 2014-12-15 ENCOUNTER — Ambulatory Visit (INDEPENDENT_AMBULATORY_CARE_PROVIDER_SITE_OTHER): Payer: Medicare Other | Admitting: Podiatry

## 2014-12-15 VITALS — BP 128/64 | HR 82 | Resp 18

## 2014-12-15 DIAGNOSIS — L539 Erythematous condition, unspecified: Secondary | ICD-10-CM | POA: Diagnosis not present

## 2014-12-15 DIAGNOSIS — I70219 Atherosclerosis of native arteries of extremities with intermittent claudication, unspecified extremity: Secondary | ICD-10-CM

## 2014-12-15 DIAGNOSIS — M79676 Pain in unspecified toe(s): Secondary | ICD-10-CM

## 2014-12-15 DIAGNOSIS — L84 Corns and callosities: Secondary | ICD-10-CM

## 2014-12-15 DIAGNOSIS — B351 Tinea unguium: Secondary | ICD-10-CM

## 2014-12-15 DIAGNOSIS — R209 Unspecified disturbances of skin sensation: Secondary | ICD-10-CM

## 2014-12-15 MED ORDER — CEPHALEXIN 500 MG PO CAPS: 500.0000 mg | ORAL_CAPSULE | Freq: Three times a day (TID) | ORAL | Status: DC

## 2014-12-15 NOTE — Patient Instructions (Signed)
Monitor for any signs/symptoms of infection. Call the office immediately if any occur or go directly to the emergency room. Call with any questions/concerns.  

## 2014-12-15 NOTE — Progress Notes (Signed)
Patient ID: [name], female   DOB: 09/04/1931, 81 y.o.   MRN: [id number]  Subjective: 81 year old female returns the office today with complaints of continued pain to the left big toe. She states the end of her left big toe was very swollen and red yesterday and she has pain to the area. She denies any recent injury or trauma to the area. She states the remainder of her toenails are painful elongated nails asking for debridement. She denies any systemic complaints as fevers, chills, nausea, vomiting. No other complaints at this time and no acute changes since last appointment.  Objective: AAO 3, NAD DP pulses palpable bilaterally, PT pulses palpable at decreased, CRT less than 3 seconds Protective sensation decreased with Simms Weinstein monofilament, decreased vibratory sensation, Achilles tendon reflex intact. Nails dystrophic, elongated, discolored 9. No swelling erythema or drainage from the nail sites. Left hallux nail has previously been removed. There is hyperkeratotic lesion left foot submetatarsal one. Upon debridement no underlying ulceration no signs of infection. On the left dorsal distal aspect of the left hallux there is a hyperkeratotic lesion with evidence of dried blood under the callus. There is a small amount of surrounding erythema around the area. Upon debridement there is a small superficial pinpoint opening.There is mild tenderness directly overlying this area. There is no ascending cellulitis. There is no increase in warmth. There is no areas of fluctuance or crepitus. There is no purulence expressed. There does appear to be a small exostosis off the distal aspect of the left hallux causing pressure over this area.  There is a small hyperkeratotic lesion at the distal aspect of the right hallux. Upon debridement there is no known ulceration. There is mild discomfort overlying the left first MTPJ without any increase in edema, erythema, increase in warmth.  No other  areas of pinpoint bony tenderness or pain with vibratory sensation. Bilateral hammertoe and mild HAV deformity present with the left more symptomatic than the right.  Prominent metatarsal heads plantarly with atrophy of the plantar fat pad.  No pain with calf compression, swelling, warmth, erythema. No other areas of open lesions or pre-ulcerative lesions.  Assessment: 81 year old female with symptomatic onychodystrophy; hyperkerotic lesions; left distal hallux superficial opening with mild surrounding erythema.   Plan: -Treatment options were discussed the patient including alternatives, risks, competitions. -At this time, due  tenderness and localized erythema over the distal aspect of the left hallux, will start Keflex in case of infection. Monitor for any clinical signs or symptoms of worsening infection and directed to call the office in the leash any occur or go directly to the emergency room. Dispensed silicone toe cap the left hallux. -Discussed shoe gear modifications and custom orthotics to help offload symptomatic areas however patient wishes to hold off at this time. -Dispensed new metatarsal pad to help offload symptomatic area.  -Nails sharply debrided x 10 without complications to patient comfort.  -Follow-up in 2 weeks to recheck left hallux, or sooner should any problems arise.

## 2014-12-15 NOTE — Telephone Encounter (Signed)
Phone call from pt.  Reported she saw her Podiatrist today for problem with area where left great toenail had been removed; was advised she "had a little infection", and placed on antibiotic.  Stated she reported to the Podiatrist that she has been noticing an "ice cold sensation" on the side of the right foot, intermittently; describes that "it feels like cold water is running over the side of the foot."  Also admits to bilateral lower legs aching with walking.  Denies any rest pain.  Stated she doesn't have any open sores or tissue loss, except for sore left great toenail site. (noted above)  Was advised to contact her vascular MD for an appt.  Advised will contact her with an appt. for further eval. of symptoms.   Agrees with plan.

## 2014-12-16 DIAGNOSIS — K219 Gastro-esophageal reflux disease without esophagitis: Secondary | ICD-10-CM | POA: Diagnosis not present

## 2014-12-16 DIAGNOSIS — D131 Benign neoplasm of stomach: Secondary | ICD-10-CM | POA: Diagnosis not present

## 2014-12-16 DIAGNOSIS — M3500 Sicca syndrome, unspecified: Secondary | ICD-10-CM | POA: Diagnosis not present

## 2014-12-16 DIAGNOSIS — Z86010 Personal history of colonic polyps: Secondary | ICD-10-CM | POA: Diagnosis not present

## 2014-12-22 ENCOUNTER — Encounter: Payer: Self-pay | Admitting: Vascular Surgery

## 2014-12-22 ENCOUNTER — Other Ambulatory Visit: Payer: Self-pay | Admitting: Gastroenterology

## 2014-12-22 DIAGNOSIS — Z86010 Personal history of colonic polyps: Secondary | ICD-10-CM | POA: Diagnosis not present

## 2014-12-22 DIAGNOSIS — D126 Benign neoplasm of colon, unspecified: Secondary | ICD-10-CM | POA: Diagnosis not present

## 2014-12-22 DIAGNOSIS — K317 Polyp of stomach and duodenum: Secondary | ICD-10-CM | POA: Diagnosis not present

## 2014-12-22 DIAGNOSIS — Z09 Encounter for follow-up examination after completed treatment for conditions other than malignant neoplasm: Secondary | ICD-10-CM | POA: Diagnosis not present

## 2014-12-22 DIAGNOSIS — D128 Benign neoplasm of rectum: Secondary | ICD-10-CM | POA: Diagnosis not present

## 2014-12-23 ENCOUNTER — Ambulatory Visit (HOSPITAL_COMMUNITY)
Admission: RE | Admit: 2014-12-23 | Discharge: 2014-12-23 | Disposition: A | Discharge status: Home / Self Care | Payer: Medicare Other | Source: Ambulatory Visit | Attending: Family | Admitting: Family

## 2014-12-23 ENCOUNTER — Encounter: Payer: Self-pay | Admitting: Family

## 2014-12-23 ENCOUNTER — Ambulatory Visit (INDEPENDENT_AMBULATORY_CARE_PROVIDER_SITE_OTHER): Payer: Medicare Other | Admitting: Family

## 2014-12-23 VITALS — BP 143/88 | HR 82 | Resp 16 | Ht 59.0 in | Wt 162.0 lb

## 2014-12-23 DIAGNOSIS — R208 Other disturbances of skin sensation: Secondary | ICD-10-CM | POA: Insufficient documentation

## 2014-12-23 DIAGNOSIS — M79601 Pain in right arm: Secondary | ICD-10-CM

## 2014-12-23 DIAGNOSIS — R209 Unspecified disturbances of skin sensation: Secondary | ICD-10-CM

## 2014-12-23 DIAGNOSIS — I70219 Atherosclerosis of native arteries of extremities with intermittent claudication, unspecified extremity: Secondary | ICD-10-CM | POA: Diagnosis not present

## 2014-12-23 DIAGNOSIS — I739 Peripheral vascular disease, unspecified: Secondary | ICD-10-CM | POA: Diagnosis not present

## 2014-12-23 DIAGNOSIS — M79602 Pain in left arm: Secondary | ICD-10-CM

## 2014-12-23 DIAGNOSIS — I10 Essential (primary) hypertension: Secondary | ICD-10-CM | POA: Insufficient documentation

## 2014-12-23 DIAGNOSIS — M79605 Pain in left leg: Secondary | ICD-10-CM

## 2014-12-23 DIAGNOSIS — M79604 Pain in right leg: Secondary | ICD-10-CM

## 2014-12-23 NOTE — Progress Notes (Signed)
[facility] HISTORY AND PHYSICAL -PAD  History of Present Illness [name] is a 81 y.o. female patient of [name] followed for evaluation of lower extremity edema as well as mild claudication. 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  She returns today for worsening pain in both great toes in the last 2 yearsand at the suggestion of her podiatrist, Dr. Paulino Door.  She had left first ingrown toenail removed about 2012, since then she has redness at the tip of this toe.  She has a remote history of left metatarsal fracture.  She has pins and needles feelings at toes, plantar surfaces, and heels of both feet, mostly at night.  Had 4 lumbar spine surgeries and has what sounds like right sciatic pain at times.  She does not have claudication symptoms, her walking is limited by dyspnea.  She denies non-healing wounds.  Her appetite is not good lately, and is losing weight, denies food fear or abdominal pain after eating; is seeing a gastroenterologist, states she has a large hiatal hernia.  Patient  New Medical or Surgical History recently passed kidney stones.   Pt Diabetic: No  Pt smoker: non-smoker  Pt meds include:  Statin :Yes  Betablocker: Yes  ASA: Yes  Other anticoagulants/antiplatelets: no   Past Medical History  Diagnosis Date  . Hypothyroidism   . Shortness of breath   . H/O hiatal hernia   . Arthritis   . Anxiety   . Neuropathy   . Tremor   . Complication of anesthesia     nigthmares, hallucinations   . Anemia   . Osteoporosis   . CHF (congestive heart failure)   . Parkinson disease   . Fibromyalgia   . Restless leg   . Sjogren's disease   . Hypertension     dr t turner  . GERD (gastroesophageal reflux disease)   . Hypercholesterolemia   . Diastolic dysfunction   . SOB (shortness of breath)     chronic due to diastolic dysfunction,  deconditioning, obesity  . PVD (peripheral vascular disease)     99% stenosis of left anteiror tibial artery, mod stenosis of left distal SFA and popliteal artery followed by [name]  . Barrett esophagus   . Chronic diastolic CHF (congestive heart failure)     Social History History  Substance Use Topics  . Smoking status: Never Smoker   . Smokeless tobacco: Never Used  . Alcohol Use: No    Family History Family History  Problem Relation Age of Onset  . Heart attack Mother   . Restless legs syndrome Mother   . Heart failure Mother   . Heart disease Mother   . Hypertension Mother   . COPD Father     Past Surgical History  Procedure Laterality Date  . Abdominal hysterectomy    . Back surgery      3 back surgeries,   lumbar fusion  . Wrist fracture surgery    . Eye surgery      cateracts  . Falls      variious fall, broken wrist,and toes  . Total hip arthroplasty      right  . Cardiac catheterization  2006    normal  . Spine surgery  April 2013    Back X's 4    Allergies  Allergen Reactions  . Codeine     headache  . Lactose Intolerance (  Gi)     Reactions to meds that contain lactose derivitives and foods  . Lyrica [Pregabalin]     Leg edema   . Other Other (See Comments)    Pain meds received after surgery caused nightmares (Percocet,Hydrocodone,Morphine)  . Plaquenil [Hydroxychloroquine Sulfate]     Stomach upset  . Reglan [Metoclopramide] Other (See Comments)    Stomach upset  . Requip [Ropinirole Hcl]     Stomach upset   . Septra [Sulfamethoxazole-Trimethoprim] Nausea Only    Current Outpatient Prescriptions  Medication Sig Dispense Refill  . carbidopa-levodopa (SINEMET IR) 25-100 MG per tablet Take 1.5 tablets by mouth 4 (four) times daily. 180 tablet 12  . CELEBREX 200 MG capsule 200 mg 2 (two) times daily.     . cephALEXin (KEFLEX) 500 MG capsule Take 1 capsule (500 mg total) by mouth 3 (three) times daily. 21 capsule 2  . dexlansoprazole  (DEXILANT) 60 MG capsule Take 60 mg by mouth daily.    Marland Kitchen diltiazem (CARDIZEM CD) 120 MG 24 hr capsule TAKE (1) CAPSULE DAILY. 90 capsule 5  . ezetimibe (ZETIA) 10 MG tablet Take 10 mg by mouth every evening.    . furosemide (LASIX) 20 MG tablet TAKE 1 TABLET ONCE DAILY. 90 tablet 3  . levothyroxine (SYNTHROID, LEVOTHROID) 50 MCG tablet Take 50 mcg by mouth daily.    Marland Kitchen lovastatin (MEVACOR) 40 MG tablet Take 40 mg by mouth at bedtime.    . metoprolol tartrate (LOPRESSOR) 25 MG tablet TAKE 1 TABLET ONCE DAILY. 90 tablet 2  . potassium chloride (K-DUR) 10 MEQ tablet     . pramipexole (MIRAPEX) 1 MG tablet Take 1 tablet (1 mg total) by mouth 4 (four) times daily. 120 tablet 12  . traMADol (ULTRAM) 50 MG tablet Take 50 mg by mouth 3 (three) times daily as needed. for pain    . traMADol (ULTRAM-ER) 200 MG 24 hr tablet Take 200 mg by mouth daily.    Marland Kitchen ampicillin (PRINCIPEN) 500 MG capsule   10  . ciprofloxacin (CIPRO) 500 MG tablet   0   No current facility-administered medications for this visit.    ROS: See HPI for pertinent positives and negatives.   Physical Examination  Filed Vitals:   12/23/14 1613  BP: 143/88  Pulse: 82  Resp: 16  Height: 4\' 11"  (1.499 m)  Weight: 162 lb (73.483 kg)   Body mass index is 32.7 kg/(m^2).  General: A&O x 3, WDWN, obese female.  Gait: normal  Eyes: PERRLA,  Pulmonary: CTAB, without wheezes , rales or rhonchi  Cardiac: regular Rythm , with murmur   Carotid Bruits  Left  Right    Negative  Negative    Aorta: is not palpable  Radial pulses: 1+ and =   VASCULAR EXAM:  Extremities without ischemic changes.  without Gangrene; without open wounds.  Tips of both great toes are moderately ruddy.  LE Pulses  LEFT  RIGHT   FEMORAL  Tender to palpation, no swelling or discoloration  2+ palpable   POPLITEAL  not palpable  not palpable   POSTERIOR TIBIAL  not palpable  not palpable   DORSALIS PEDIS  ANTERIOR TIBIAL  2+  palpable  2+ palpable    Abdomen: soft, NT, no palpable masse., dry mouth. Skin: no rashes, no ulcers.  Musculoskeletal: no muscle wasting or atrophy.  Neurologic: A&O X 3; Appropriate Affect ; SENSATION: normal; MOTOR FUNCTION: moving all extremities equally, motor strength 4/5 throughout. Speech is fluent/normal. CN 2-12 intact.  Non-Invasive Vascular Imaging: DATE: 12/23/2014 Resting bilateral ABI's are non compressible indicating the presence of tibial artery medial calcification. The bilateral lower extremity Doppler and PPG waveforms are normal. The bilateral toe-brachial indices are >0.70 which is within normal limits. No significant change in the bilateral tibial artery waveforms when compared to the previous exam with an increase in the toe-brachial indices noted: right: 1.08, left: 0.93   ASSESSMENT: [name] is a 81 y.o. female who is followed for evaluation of lower extremity edema as well as mild claudication. 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  She returns today for worsening pain in both great toes in the last 2 yearsand at the suggestion of her podiatrist, Dr. Paulino Door. Discussed ABI results, HPI, and exam results with [name]. The pain in her toes is not due to lack of arterial perfusion as ABI's indicate the perfusion to her ankles and toes is normal despite the presence of tibial artery medial calcification. Consider other differentials such as hyperparathyroidism since she recently passed several kidney stones, or gout, or Sjogren's related syndrome. Pt states her nephew has severe gout.  Face to face time with patient was 25 minutes. Over 50% of this time was spent on counseling and coordination of care.   PLAN:  I discussed in depth with the patient the nature of atherosclerosis, and emphasized the importance of maximal medical management including strict control of  blood pressure, blood glucose, and lipid levels, obtaining regular exercise, and continued cessation of smoking.  The patient is aware that without maximal medical management the underlying atherosclerotic disease process will progress, limiting the benefit of any interventions.  Based on the patient's vascular studies and examination, pt will return to clinic in 6 months with ABI's. She knows to return sooner should she develop non healing wounds.  The patient was given information about PAD including signs, symptoms, treatment, what symptoms should prompt the patient to seek immediate medical care, and risk reduction measures to take.  Clemon Chambers, RN, MSN, FNP-C [facility] Phone: [phone]  Clinic MD: [facility]  12/23/2014   4:27 PM

## 2014-12-23 NOTE — Patient Instructions (Signed)

## 2014-12-24 DIAGNOSIS — M47816 Spondylosis without myelopathy or radiculopathy, lumbar region: Secondary | ICD-10-CM | POA: Diagnosis not present

## 2014-12-24 DIAGNOSIS — Z6833 Body mass index (BMI) 33.0-33.9, adult: Secondary | ICD-10-CM | POA: Diagnosis not present

## 2014-12-29 ENCOUNTER — Ambulatory Visit (INDEPENDENT_AMBULATORY_CARE_PROVIDER_SITE_OTHER): Payer: Medicare Other | Admitting: Podiatry

## 2014-12-29 ENCOUNTER — Encounter: Payer: Self-pay | Admitting: Podiatry

## 2014-12-29 VITALS — BP 99/59 | HR 80 | Resp 18

## 2014-12-29 DIAGNOSIS — M205X9 Other deformities of toe(s) (acquired), unspecified foot: Secondary | ICD-10-CM | POA: Diagnosis not present

## 2014-12-29 DIAGNOSIS — L84 Corns and callosities: Secondary | ICD-10-CM

## 2014-12-29 DIAGNOSIS — M79675 Pain in left toe(s): Secondary | ICD-10-CM

## 2014-12-29 NOTE — Progress Notes (Signed)
   Subjective:    Patient ID: [name], female    DOB: 01/22/1931, 81 y.o.   MRN: [id number]  HPI  81 year old female presents the office they for follow-up evaluation of left big toe pain. She states that she has completed the course of antibiotics. She continues to have callus formation at the end of the left big toe. She also states that she has continued pain in the left big toe joint. She denies any increase in erythema or any edema/warmth. She continues to have symptoms of "water dripping" on her feet. She has previously seen vascular surgery, neurology for this as well. She is also seen multiple other podiatrists. No other complaints at this time.    Review of Systems  All other systems reviewed and are negative.      Objective:   Physical Exam  AAO 3, NAD   DP pulses palpable bilaterally, PT pulses decreased. CRT less than 3 seconds   protective sensation appears to be decreased with Derrel Nip monofilament, decreased vibratory sensation, Achilles tendon reflex intact.   there is a small amount of hyperkeratotic tissue with the very distal aspect left hallux with a slight amount of erythema which is decreased compared to previous appointment. Erythema is likely result of irritation. There does appear to be a small exostosis or prominence of the very distal aspect of the dorsal hallux on the left foot. It appears at this area of the knee rubbing in her shoe. There is tenderness with range of motion of the left first MTPJ greater than the right. There is significant decrease in first MTPJ range of motion bilaterally left greater than right. Exostosis and arthritic changes are palpable in the first MTPJ. No other areas of tenderness to bilateral lower extremities. No other areas of overlying edema, erythema, increase in warmth. No pain with calf compression, swelling, warmth, erythema.        Assessment & Plan:   81 year old female with symptomatic hallux limitus,  symptomatic hyperkeratotic lesion left dorsal distal hallux  -Treatment options were again discussed with the patient including alternatives, risks, complications.  -Hyperkeratotic lesion sharply debrided without complication/bleeding to the left hallux. At this time he doesn't. The erythema is due to irritation. I discussed with the patient's shoe gear modifications.  -Discussed again today possible steroid injection the left first MTPJ and around the symptomatic area. Risks and complications of the injection were discussed the patient for which she understands and verbally consents of the injection. Under sterile conditions a total of 1.5 mL of dexamethasone phosphate and 0.5% Marcaine plain was infiltrated into the area of maximal tenderness along the left first MTPJ surrounding area. A Band-Aid was applied. Patient tolerated the injection well without complications. Post injection care was discussed with the patient for which she verbally understood.  -Discussed with the patient she may benefit from a custom accommodative insert to help take pressure off symptomatically areas. Prescription was given to the patient for inserts.  -Follow-up once she gets the inserts or sooner should any problems arise. In the meantime, encouraged to call the office with any questions, concerns, change in symptoms.

## 2015-01-08 DIAGNOSIS — N390 Urinary tract infection, site not specified: Secondary | ICD-10-CM | POA: Diagnosis not present

## 2015-01-10 DIAGNOSIS — M545 Low back pain: Secondary | ICD-10-CM | POA: Diagnosis not present

## 2015-01-10 DIAGNOSIS — Z6833 Body mass index (BMI) 33.0-33.9, adult: Secondary | ICD-10-CM | POA: Diagnosis not present

## 2015-01-19 DIAGNOSIS — M545 Low back pain: Secondary | ICD-10-CM | POA: Diagnosis not present

## 2015-01-19 DIAGNOSIS — Z6833 Body mass index (BMI) 33.0-33.9, adult: Secondary | ICD-10-CM | POA: Diagnosis not present

## 2015-01-19 DIAGNOSIS — M47816 Spondylosis without myelopathy or radiculopathy, lumbar region: Secondary | ICD-10-CM | POA: Diagnosis not present

## 2015-01-26 ENCOUNTER — Encounter: Payer: Self-pay | Admitting: Cardiology

## 2015-02-04 DIAGNOSIS — Z961 Presence of intraocular lens: Secondary | ICD-10-CM | POA: Diagnosis not present

## 2015-02-04 DIAGNOSIS — H532 Diplopia: Secondary | ICD-10-CM | POA: Diagnosis not present

## 2015-02-04 DIAGNOSIS — H0015 Chalazion left lower eyelid: Secondary | ICD-10-CM | POA: Diagnosis not present

## 2015-02-09 DIAGNOSIS — M3501 Sicca syndrome with keratoconjunctivitis: Secondary | ICD-10-CM | POA: Diagnosis not present

## 2015-02-09 DIAGNOSIS — M19049 Primary osteoarthritis, unspecified hand: Secondary | ICD-10-CM | POA: Diagnosis not present

## 2015-02-09 DIAGNOSIS — M2011 Hallux valgus (acquired), right foot: Secondary | ICD-10-CM | POA: Diagnosis not present

## 2015-02-09 DIAGNOSIS — M810 Age-related osteoporosis without current pathological fracture: Secondary | ICD-10-CM | POA: Diagnosis not present

## 2015-02-09 DIAGNOSIS — N200 Calculus of kidney: Secondary | ICD-10-CM | POA: Diagnosis not present

## 2015-02-09 DIAGNOSIS — M5489 Other dorsalgia: Secondary | ICD-10-CM | POA: Diagnosis not present

## 2015-02-09 DIAGNOSIS — M2550 Pain in unspecified joint: Secondary | ICD-10-CM | POA: Diagnosis not present

## 2015-02-09 DIAGNOSIS — Z791 Long term (current) use of non-steroidal anti-inflammatories (NSAID): Secondary | ICD-10-CM | POA: Diagnosis not present

## 2015-02-09 DIAGNOSIS — M1812 Unilateral primary osteoarthritis of first carpometacarpal joint, left hand: Secondary | ICD-10-CM | POA: Diagnosis not present

## 2015-02-09 DIAGNOSIS — M2012 Hallux valgus (acquired), left foot: Secondary | ICD-10-CM | POA: Diagnosis not present

## 2015-02-15 DIAGNOSIS — N200 Calculus of kidney: Secondary | ICD-10-CM | POA: Diagnosis not present

## 2015-02-15 DIAGNOSIS — Z09 Encounter for follow-up examination after completed treatment for conditions other than malignant neoplasm: Secondary | ICD-10-CM | POA: Insufficient documentation

## 2015-02-15 DIAGNOSIS — R8290 Unspecified abnormal findings in urine: Secondary | ICD-10-CM | POA: Diagnosis not present

## 2015-02-16 DIAGNOSIS — R8290 Unspecified abnormal findings in urine: Secondary | ICD-10-CM | POA: Diagnosis not present

## 2015-02-16 DIAGNOSIS — Z09 Encounter for follow-up examination after completed treatment for conditions other than malignant neoplasm: Secondary | ICD-10-CM | POA: Diagnosis not present

## 2015-02-16 DIAGNOSIS — N200 Calculus of kidney: Secondary | ICD-10-CM | POA: Diagnosis not present

## 2015-02-24 ENCOUNTER — Other Ambulatory Visit: Payer: Self-pay | Admitting: Cardiology

## 2015-02-26 ENCOUNTER — Emergency Department (HOSPITAL_COMMUNITY)
Admission: EM | Admit: 2015-02-26 | Discharge: 2015-02-26 | Disposition: A | Discharge status: Home / Self Care | Payer: Medicare Other | Attending: Emergency Medicine | Admitting: Emergency Medicine

## 2015-02-26 ENCOUNTER — Emergency Department (HOSPITAL_COMMUNITY): Payer: Medicare Other

## 2015-02-26 ENCOUNTER — Encounter (HOSPITAL_COMMUNITY): Payer: Self-pay | Admitting: Emergency Medicine

## 2015-02-26 DIAGNOSIS — F419 Anxiety disorder, unspecified: Secondary | ICD-10-CM | POA: Insufficient documentation

## 2015-02-26 DIAGNOSIS — Y9389 Activity, other specified: Secondary | ICD-10-CM | POA: Diagnosis not present

## 2015-02-26 DIAGNOSIS — I5032 Chronic diastolic (congestive) heart failure: Secondary | ICD-10-CM | POA: Diagnosis not present

## 2015-02-26 DIAGNOSIS — I10 Essential (primary) hypertension: Secondary | ICD-10-CM | POA: Diagnosis not present

## 2015-02-26 DIAGNOSIS — W01198A Fall on same level from slipping, tripping and stumbling with subsequent striking against other object, initial encounter: Secondary | ICD-10-CM | POA: Diagnosis not present

## 2015-02-26 DIAGNOSIS — S8991XA Unspecified injury of right lower leg, initial encounter: Secondary | ICD-10-CM | POA: Diagnosis not present

## 2015-02-26 DIAGNOSIS — Z79899 Other long term (current) drug therapy: Secondary | ICD-10-CM | POA: Insufficient documentation

## 2015-02-26 DIAGNOSIS — Z792 Long term (current) use of antibiotics: Secondary | ICD-10-CM | POA: Diagnosis not present

## 2015-02-26 DIAGNOSIS — Z9889 Other specified postprocedural states: Secondary | ICD-10-CM | POA: Insufficient documentation

## 2015-02-26 DIAGNOSIS — G20 Parkinson's disease: Secondary | ICD-10-CM | POA: Diagnosis not present

## 2015-02-26 DIAGNOSIS — S8992XA Unspecified injury of left lower leg, initial encounter: Secondary | ICD-10-CM | POA: Insufficient documentation

## 2015-02-26 DIAGNOSIS — M25562 Pain in left knee: Secondary | ICD-10-CM | POA: Diagnosis not present

## 2015-02-26 DIAGNOSIS — Z8739 Personal history of other diseases of the musculoskeletal system and connective tissue: Secondary | ICD-10-CM | POA: Insufficient documentation

## 2015-02-26 DIAGNOSIS — M25512 Pain in left shoulder: Secondary | ICD-10-CM | POA: Diagnosis not present

## 2015-02-26 DIAGNOSIS — S4992XA Unspecified injury of left shoulder and upper arm, initial encounter: Secondary | ICD-10-CM | POA: Insufficient documentation

## 2015-02-26 DIAGNOSIS — S5011XA Contusion of right forearm, initial encounter: Secondary | ICD-10-CM | POA: Diagnosis not present

## 2015-02-26 DIAGNOSIS — E039 Hypothyroidism, unspecified: Secondary | ICD-10-CM | POA: Insufficient documentation

## 2015-02-26 DIAGNOSIS — Z862 Personal history of diseases of the blood and blood-forming organs and certain disorders involving the immune mechanism: Secondary | ICD-10-CM | POA: Diagnosis not present

## 2015-02-26 DIAGNOSIS — S0031XA Abrasion of nose, initial encounter: Secondary | ICD-10-CM | POA: Diagnosis not present

## 2015-02-26 DIAGNOSIS — E780 Pure hypercholesterolemia: Secondary | ICD-10-CM | POA: Diagnosis not present

## 2015-02-26 DIAGNOSIS — K219 Gastro-esophageal reflux disease without esophagitis: Secondary | ICD-10-CM | POA: Diagnosis not present

## 2015-02-26 DIAGNOSIS — Y998 Other external cause status: Secondary | ICD-10-CM | POA: Insufficient documentation

## 2015-02-26 DIAGNOSIS — S52501A Unspecified fracture of the lower end of right radius, initial encounter for closed fracture: Secondary | ICD-10-CM | POA: Diagnosis not present

## 2015-02-26 DIAGNOSIS — Z8781 Personal history of (healed) traumatic fracture: Secondary | ICD-10-CM | POA: Insufficient documentation

## 2015-02-26 DIAGNOSIS — M25561 Pain in right knee: Secondary | ICD-10-CM | POA: Diagnosis not present

## 2015-02-26 DIAGNOSIS — Y9289 Other specified places as the place of occurrence of the external cause: Secondary | ICD-10-CM | POA: Diagnosis not present

## 2015-02-26 DIAGNOSIS — S6991XA Unspecified injury of right wrist, hand and finger(s), initial encounter: Secondary | ICD-10-CM | POA: Insufficient documentation

## 2015-02-26 DIAGNOSIS — S52591A Other fractures of lower end of right radius, initial encounter for closed fracture: Secondary | ICD-10-CM | POA: Diagnosis not present

## 2015-02-26 MED ORDER — TRAMADOL HCL 50 MG PO TABS
50.0000 mg | ORAL_TABLET | Freq: Once | ORAL | Status: AC
Start: 2015-02-26 — End: 2015-02-26
  Administered 2015-02-26: 50 mg via ORAL
  Filled 2015-02-26: qty 1

## 2015-02-26 NOTE — ED Provider Notes (Signed)
CSN: [id number]     Arrival date & time 02/26/15  1911 History   First MD Initiated Contact with Patient 02/26/15 1929     Chief Complaint  Patient presents with  . Shoulder Pain  . Fall  . Head Injury  . Wrist Injury  . Knee Pain     (Consider location/radiation/quality/duration/timing/severity/associated sxs/prior Treatment) The history is provided by the patient.  [name] is a 81 y.o. female history of fibromyalgia, Sjogren's disease, hypertension, here presenting with fall. She was trying to get ready to go to a restaurant. She stated that she tripped over something and had a mechanical fall. He landed on the right arm but also had bilateral knee pain and left shoulder pain. She says she may have hit her head but she denies any loss of consciousness or syncope. She is not currently on any blood thinners and denies any headache or vomiting.    Past Medical History  Diagnosis Date  . Hypothyroidism   . Shortness of breath   . H/O hiatal hernia   . Arthritis   . Anxiety   . Neuropathy   . Tremor   . Complication of anesthesia     nigthmares, hallucinations   . Anemia   . Osteoporosis   . CHF (congestive heart failure)   . Parkinson disease   . Fibromyalgia   . Restless leg   . Sjogren's disease   . Hypertension     dr t turner  . GERD (gastroesophageal reflux disease)   . Hypercholesterolemia   . Diastolic dysfunction   . SOB (shortness of breath)     chronic due to diastolic dysfunction, deconditioning, obesity  . PVD (peripheral vascular disease)     99% stenosis of left anteiror tibial artery, mod stenosis of left distal SFA and popliteal artery followed by [name]  . Barrett esophagus   . Chronic diastolic CHF (congestive heart failure)    Past Surgical History  Procedure Laterality Date  . Abdominal hysterectomy    . Back surgery      3 back surgeries,   lumbar fusion  . Wrist fracture surgery    . Eye surgery      cateracts  . Falls       variious fall, broken wrist,and toes  . Total hip arthroplasty      right  . Cardiac catheterization  2006    normal  . Spine surgery  April 2013    Back X's 4   Family History  Problem Relation Age of Onset  . Heart attack Mother   . Restless legs syndrome Mother   . Heart failure Mother   . Heart disease Mother   . Hypertension Mother   . COPD Father    History  Substance Use Topics  . Smoking status: Never Smoker   . Smokeless tobacco: Never Used  . Alcohol Use: No   OB History    No data available     Review of Systems  Musculoskeletal:       R wrist, L shoulder, bilateral knee pain   All other systems reviewed and are negative.     Allergies  Codeine; Lactose intolerance (gi); Lyrica; Other; Plaquenil; Reglan; Requip; and Septra  Home Medications   Prior to Admission medications   Medication Sig Start Date End Date Taking? Authorizing Provider  bisacodyl (DULCOLAX) 5 MG EC tablet Take 5 mg by mouth daily as needed for moderate constipation.   Yes Historical Provider, MD  carbidopa-levodopa (SINEMET IR) 25-100 MG per tablet Take 1.5 tablets by mouth 4 (four) times daily. 02/24/14  Yes Vikram R Penumalli, MD  CELEBREX 200 MG capsule 200 mg 2 (two) times daily.  11/03/13  Yes Historical Provider, MD  CRANBERRY PO Take 1 tablet by mouth daily.   Yes Historical Provider, MD  denosumab (PROLIA) 60 MG/ML SOLN injection Inject 60 mg into the skin every 6 (six) months. Administer in upper arm, thigh, or abdomen   Yes Historical Provider, MD  dexlansoprazole (DEXILANT) 60 MG capsule Take 60 mg by mouth daily.   Yes Historical Provider, MD  diltiazem (CARDIZEM CD) 120 MG 24 hr capsule TAKE (1) CAPSULE DAILY. 05/28/14  Yes [name], MD  Docusate Calcium (STOOL SOFTENER PO) Take 1 tablet by mouth at bedtime.   Yes Historical Provider, MD  ezetimibe (ZETIA) 10 MG tablet Take 10 mg by mouth every evening.   Yes Historical Provider, MD  furosemide (LASIX) 20 MG tablet TAKE  1 TABLET ONCE DAILY. 02/24/15  Yes [name], MD  levothyroxine (SYNTHROID, LEVOTHROID) 50 MCG tablet Take 50 mcg by mouth daily.   Yes Historical Provider, MD  lovastatin (MEVACOR) 40 MG tablet Take 40 mg by mouth at bedtime.   Yes Historical Provider, MD  metoprolol tartrate (LOPRESSOR) 25 MG tablet TAKE 1 TABLET ONCE DAILY. 10/29/14  Yes [name], MD  polyethylene glycol (MIRALAX / GLYCOLAX) packet Take 17 g by mouth daily as needed for mild constipation or moderate constipation.   Yes Historical Provider, MD  potassium citrate (UROCIT-K) 10 MEQ (1080 MG) SR tablet Take 10 mEq by mouth 2 (two) times daily.  02/15/15  Yes Historical Provider, MD  pramipexole (MIRAPEX) 1 MG tablet Take 1 tablet (1 mg total) by mouth 4 (four) times daily. Patient taking differently: Take 1 mg by mouth 3 (three) times daily.  02/24/14  Yes [name], MD  traMADol (ULTRAM) 50 MG tablet Take 50 mg by mouth 3 (three) times daily as needed for moderate pain.   Yes Historical Provider, MD  traMADol (ULTRAM-ER) 200 MG 24 hr tablet Take 200 mg by mouth daily.   Yes Historical Provider, MD  cephALEXin (KEFLEX) 500 MG capsule Take 1 capsule (500 mg total) by mouth 3 (three) times daily. Patient not taking: Reported on 02/26/2015 12/15/14   Trula Slade, DPM   BP 130/57 mmHg  Pulse 77  Temp(Src) 97.6 F (36.4 C) (Oral)  Resp 17  SpO2 98% Physical Exam  Constitutional: She is oriented to person, place, and time.  Chronically ill   HENT:  Head: Normocephalic.  Abrasion on nose, no obvious laceration or nose deformity. No scalp hematoma   Eyes: Conjunctivae are normal. Pupils are equal, round, and reactive to light.  Neck: Normal range of motion. Neck supple.  Cardiovascular: Normal rate, regular rhythm and normal heart sounds.   Pulmonary/Chest: Effort normal and breath sounds normal. No respiratory distress. She has no wheezes. She has no rales.  Abdominal: Soft. Bowel sounds are normal. She  exhibits no distension. There is no tenderness. There is no rebound.  Musculoskeletal:  Bruise R forearm, minimal tenderness and swelling R wrist, nl ROM of wrist and nl radial pulse. L shoulder tenderness with no deformity. Bilateral knee tenderness with no obvious deformity. Pelvis stable. Bilateral hips nl ROM and nontender   Neurological: She is alert and oriented to person, place, and time. No cranial nerve deficit. Coordination normal.  Skin: Skin is warm and dry.  Psychiatric: She has a normal mood and affect. Her behavior is normal. Judgment and thought content normal.  Nursing note and vitals reviewed.   ED Course  Procedures (including critical care time) Labs Review Labs Reviewed - No data to display  Imaging Review Dg Forearm Right  02/26/2015   CLINICAL DATA:  Acute fall today with distal forearm pain and swelling. Initial encounter.  EXAM: RIGHT FOREARM - 2 VIEW  COMPARISON:  02/09/2015 radiograph  FINDINGS: A nondisplaced distal radial fracture is identified.  No other fracture, subluxation or dislocation identified.  No focal bony lesions are present.  Degenerative changes of the first carpometacarpal joint again noted.  IMPRESSION: Nondisplaced distal radial fracture.   Electronically Signed   By: Margarette Canada M.D.   On: 02/26/2015 21:13   Dg Wrist Complete Right  02/26/2015   CLINICAL DATA:  [place] today.  EXAM: RIGHT WRIST - COMPLETE 3+ VIEW  COMPARISON:  None.  FINDINGS: There is a well aligned fracture of the distal radius without significant displacement. Distal ulna appears intact. There is no radiopaque foreign body.  IMPRESSION: Distal radius fracture   Electronically Signed   By: Andreas Newport M.D.   On: 02/26/2015 21:14   Dg Shoulder Left  02/26/2015   CLINICAL DATA:  Fall with acute left shoulder injury and pain today. Initial encounter.  EXAM: LEFT SHOULDER - 2+ VIEW  COMPARISON:  None.  FINDINGS: There is no evidence of fracture or dislocation. There is no  evidence of arthropathy or other focal bone abnormality. Soft tissues are unremarkable.  IMPRESSION: Negative.   Electronically Signed   By: Margarette Canada M.D.   On: 02/26/2015 21:14   Dg Knee Complete 4 Views Left  02/26/2015   CLINICAL DATA:  82 year old female with fall and acute left knee injury and pain. Initial encounter.  EXAM: LEFT KNEE - COMPLETE 4+ VIEW  COMPARISON:  None  FINDINGS: There is no evidence of fracture, dislocation, or joint effusion.  There is no evidence of arthropathy or other focal bone abnormality. Soft tissues are unremarkable.  Vascular calcifications identified.  IMPRESSION: No acute abnormality.   Electronically Signed   By: Margarette Canada M.D.   On: 02/26/2015 21:16   Dg Knee Complete 4 Views Right  02/26/2015   CLINICAL DATA:  81 year old female with fall and acute right knee injury and pain. Initial encounter.  EXAM: RIGHT KNEE - COMPLETE 4+ VIEW  COMPARISON:  None.  FINDINGS: There is no evidence of fracture, dislocation, or joint effusion. There is no evidence of arthropathy or other focal bone abnormality. Soft tissues are unremarkable.  Vascular calcifications are identified.  IMPRESSION: No acute abnormality.   Electronically Signed   By: Margarette Canada M.D.   On: 02/26/2015 21:17     EKG Interpretation None      MDM   Final diagnoses:  Knee injury, right, initial encounter    KARTER HAIRE is a 81 y.o. female here with fall. Has head injury but not on blood thinners and no scalp hematoma and nl neuro exam so will not do CT head/neck. Will get xrays and give pain meds.   10:03 PM Xray showed R distal radius fracture that is well aligned. Will place on sugar tongue. Talked to Dr. Angus Palms partner, Dr. Maureen Ralphs, who recommend f/u next week.    Wandra Arthurs, MD 02/26/15 2203

## 2015-02-26 NOTE — Discharge Instructions (Signed)
Wear splint at all times.   Wear sling as needed.  Take tramadol for pain.   See Dr. Caralyn Guile next week for repeat xrays.   Return to ER if you have severe pain, numbness in fingers.

## 2015-02-26 NOTE — ED Notes (Signed)
Pt fell today around 1830, reports she "tripped." Denies dizziness before falling. Reports left shoulder pain, right hand/wrist/forearm/shoulder pain, bilateral knee pain. Right wrist looks slightly deformed. Bruising noted on right wrist and forearm. Reports hitting head-has abrasion on nose. Patient ambulatory with walker at home at baseline. A&Ox4. Moving all extremities. Had kidney stones recently-started on new medication-family associates fall with this medication (possibly) but cannot recall new medication. No other c/c. Denies being on blood thinners. Hx left ankle fracture, left foot injury, multiple back fractures.

## 2015-03-01 DIAGNOSIS — S52531A Colles' fracture of right radius, initial encounter for closed fracture: Secondary | ICD-10-CM | POA: Diagnosis not present

## 2015-03-02 ENCOUNTER — Encounter: Payer: Self-pay | Admitting: Diagnostic Neuroimaging

## 2015-03-02 ENCOUNTER — Ambulatory Visit (INDEPENDENT_AMBULATORY_CARE_PROVIDER_SITE_OTHER): Payer: Medicare Other | Admitting: Diagnostic Neuroimaging

## 2015-03-02 VITALS — BP 112/58 | HR 74 | Ht 60.0 in | Wt 170.6 lb

## 2015-03-02 DIAGNOSIS — G20 Parkinson's disease: Secondary | ICD-10-CM | POA: Diagnosis not present

## 2015-03-02 DIAGNOSIS — R269 Unspecified abnormalities of gait and mobility: Secondary | ICD-10-CM | POA: Diagnosis not present

## 2015-03-02 DIAGNOSIS — I70219 Atherosclerosis of native arteries of extremities with intermittent claudication, unspecified extremity: Secondary | ICD-10-CM | POA: Diagnosis not present

## 2015-03-02 DIAGNOSIS — R413 Other amnesia: Secondary | ICD-10-CM

## 2015-03-02 MED ORDER — CARBIDOPA-LEVODOPA 25-100 MG PO TABS: 2.0000 | ORAL_TABLET | Freq: Three times a day (TID) | ORAL | Status: DC

## 2015-03-02 NOTE — Progress Notes (Signed)
[facility]  PATIENT: [name] DOB: [birth year]  REFERRING CLINICIAN:  HISTORY FROM: patient and husband and daughter REASON FOR VISIT: follow up   HISTORICAL  CHIEF COMPLAINT:  Chief Complaint  Patient presents with  . Follow-up    Parkinson's Disease     HISTORY OF PRESENT ILLNESS:   UPDATE 03/02/15: Since last visit, patient's balance has worsened. She fell recently (02/26/15), with right non-displaced distal right radial fracture. Notes more tremor and wearing off around 4pm everyday. Currently on carb/levo 1.5tab TID and pramipexole 1mg  TID. Sometimes takes a 4th dose of carb/levo 1.5tabs. Had been using a walker, mainly at night time, but did not use at time of fall.  UPDATE 09/03/14: Since last visit, tremor is well controlled. Gait and freezing are stable,. Doing well on (carb/levo 1.5 tabs + pramipexole 1mg ) taken TID (sometimes QID, with extra late evening dose if wearing off is notable). Having more foot pain, numbness, drooling prob, flushing, anxiety. Saw psychology last visit, then rec to see psychiatry, but sxs improved and the psychiatrist who was recommended to them was not taking new patients.   UPDATE 02/24/14: Since last visit, has increased carb/levo up to 1 tab 4x per day; has also incr pramipexole up to 1mg  4x per day (for convenience of remembering, even though we normally dose BID or TID). Also with more anxiety, decr confidence, more tremor in right upper ext. Also reports some right shoulder, bilateral knee, low back pain. Has noticed some freezing of gait. Has appt with psychology later today.  UPDATE 08/26/13: Since last visit, noticing some wearing off in early evening. Takes carb/levo 6:30am, noon, 9pm. Also getting more "worked up" lately, reading about PD online. No falls. Uses cane and walker. More fatigue and mental exhaustion.  UPDATE 02/04/13: Since last visit, has had some minor falls. tremor and PD sxs better on carb/levo.  No anxiety. Some drooling and hypophonia. Not using walker or cane all the time. constipation stable.  UPDATE 10/06/12: Since last visit, had another back surgery (april 2013), dx'd with CHF, dx'd with sjogrens, also with fall in August 2013 with pelvic fracture. Reports good tremor control. Some freezing of gait.  UPDATE 02/13/12: Now on pramipexole 1mg  QID (7am, noon, 5pm, 10pm). Some wearing off around 5pm. Also, had rxn to forteo, now with right leg pain. Also getting lumbar radiculopathy eval per ortho.  UPDATE 08/14/11: Tremor developing around 4pm now.  More anxiety per husband.  Also with UTI x 2.  Now dx'd with left foot fractures (? "stubbed" toes; no falls).  UPDATE 04/27/11: Tolerating pramipexole 1mg  TID (7am, 1-2pm, 7p).  some wearing off right at 7p.  feels unsteady, and has ongoing back problems and pain.  UPDATE 01/24/11: Doing slightly better with pramipexole 1mg  BID.  Tremor returns later in the day before evening dose.  Still with gait and balance diff.    PRIOR HPI: 81 year old female with history of hypertension, hypercholesterolemia, hypothyroidism, fibromyalgia, restless leg syndrome, presenting for evaluation of tremor for the past 3 years. She is here with her husband. Approximately 3 years ago patient developed intermittent tremor in her left hand particularly in her thumb and fingers.  Tremor is most noticeable at rest and in the evening.  In June 2011, patient tripped, fell and broke her left wrist and forearm.  This was repaired surgically and in a cast; during this time and since then her tremor has improved. Patient also has history of restless leg syndrome for the  past 5 years. She's been on pramipexole for number of years (0.5mg  qhs) and recently increased to 1 mg qhs a few weeks ago.  The patient's mother also had restless leg syndrome. Patient has a diagnosis of iron deficiency anemia but is not on iron replacement.  REVIEW OF SYSTEMS: Full 14 system review of systems  performed and notable only for fatigue appetite excessive sweating trouble swallowing drooling murmur leg swelling restless leg daytime sleepiness sleep talking joint pain joint swelling neck pain itching agitation confusion anxiety tremors easy bruising painful urination excess thirst dry mouth.    ALLERGIES: Allergies  Allergen Reactions  . Codeine     headache  . Lactose Intolerance (Gi)     Reactions to meds that contain lactose derivitives and foods  . Lyrica [Pregabalin]     Leg edema   . Other Other (See Comments)    Pain meds received after surgery caused nightmares (Percocet,Hydrocodone,Morphine)  . Plaquenil [Hydroxychloroquine Sulfate]     Stomach upset  . Reglan [Metoclopramide] Other (See Comments)    Stomach upset  . Requip [Ropinirole Hcl]     Stomach upset   . Septra [Sulfamethoxazole-Trimethoprim] Nausea Only    HOME MEDICATIONS: Outpatient Prescriptions Prior to Visit  Medication Sig Dispense Refill  . carbidopa-levodopa (SINEMET IR) 25-100 MG per tablet Take 1.5 tablets by mouth 4 (four) times daily. 180 tablet 12  . CELEBREX 200 MG capsule 200 mg 2 (two) times daily.     . cephALEXin (KEFLEX) 500 MG capsule Take 1 capsule (500 mg total) by mouth 3 (three) times daily. 21 capsule 2  . denosumab (PROLIA) 60 MG/ML SOLN injection Inject 60 mg into the skin every 6 (six) months. Administer in upper arm, thigh, or abdomen    . dexlansoprazole (DEXILANT) 60 MG capsule Take 60 mg by mouth daily.    Marland Kitchen diltiazem (CARDIZEM CD) 120 MG 24 hr capsule TAKE (1) CAPSULE DAILY. 90 capsule 5  . ezetimibe (ZETIA) 10 MG tablet Take 10 mg by mouth every evening.    . furosemide (LASIX) 20 MG tablet TAKE 1 TABLET ONCE DAILY. 90 tablet 1  . levothyroxine (SYNTHROID, LEVOTHROID) 50 MCG tablet Take 50 mcg by mouth daily.    Marland Kitchen lovastatin (MEVACOR) 40 MG tablet Take 40 mg by mouth at bedtime.    . metoprolol tartrate (LOPRESSOR) 25 MG tablet TAKE 1 TABLET ONCE DAILY. 90 tablet 2  .  potassium citrate (UROCIT-K) 10 MEQ (1080 MG) SR tablet Take 10 mEq by mouth 2 (two) times daily.   10  . pramipexole (MIRAPEX) 1 MG tablet Take 1 tablet (1 mg total) by mouth 4 (four) times daily. 120 tablet 12  . traMADol (ULTRAM) 50 MG tablet Take 50 mg by mouth 3 (three) times daily as needed for moderate pain.    . traMADol (ULTRAM-ER) 200 MG 24 hr tablet Take 200 mg by mouth daily.    . bisacodyl (DULCOLAX) 5 MG EC tablet Take 5 mg by mouth daily as needed for moderate constipation.    Marland Kitchen CRANBERRY PO Take 1 tablet by mouth daily.    Mariane Baumgarten Calcium (STOOL SOFTENER PO) Take 1 tablet by mouth at bedtime.    . polyethylene glycol (MIRALAX / GLYCOLAX) packet Take 17 g by mouth daily as needed for mild constipation or moderate constipation.     No facility-administered medications prior to visit.    PAST MEDICAL HISTORY: Past Medical History  Diagnosis Date  . Hypothyroidism   . Shortness of breath   .  H/O hiatal hernia   . Arthritis   . Anxiety   . Neuropathy   . Tremor   . Complication of anesthesia     nigthmares, hallucinations   . Anemia   . Osteoporosis   . CHF (congestive heart failure)   . Parkinson disease   . Fibromyalgia   . Restless leg   . Sjogren's disease   . Hypertension     dr t turner  . GERD (gastroesophageal reflux disease)   . Hypercholesterolemia   . Diastolic dysfunction   . SOB (shortness of breath)     chronic due to diastolic dysfunction, deconditioning, obesity  . PVD (peripheral vascular disease)     99% stenosis of left anteiror tibial artery, mod stenosis of left distal SFA and popliteal artery followed by [name]  . Barrett esophagus   . Chronic diastolic CHF (congestive heart failure)   . Broken arm     right     PAST SURGICAL HISTORY: Past Surgical History  Procedure Laterality Date  . Abdominal hysterectomy    . Back surgery      3 back surgeries,   lumbar fusion  . Wrist fracture surgery    . Eye surgery      cateracts    . Falls      variious fall, broken wrist,and toes  . Total hip arthroplasty      right  . Cardiac catheterization  2006    normal  . Spine surgery  April 2013    Back X's 4    FAMILY HISTORY: Family History  Problem Relation Age of Onset  . Heart attack Mother   . Restless legs syndrome Mother   . Heart failure Mother   . Heart disease Mother   . Hypertension Mother   . COPD Father     SOCIAL HISTORY:  History   Social History  . Marital Status: Married    Spouse Name: [name]  . Number of Children: 2  . Years of Education: HS   Occupational History  .      Homemaker   Social History Main Topics  . Smoking status: Never Smoker   . Smokeless tobacco: Never Used  . Alcohol Use: No  . Drug Use: No  . Sexual Activity: No   Other Topics Concern  . Not on file   Social History Narrative   Patient lives at home with her spouse.   Caffeine Use: tea     PHYSICAL EXAM  Filed Vitals:   03/02/15 0829  BP: 112/58  Pulse: 74  Height: 5' (1.524 m)  Weight: 170 lb 9.6 oz (77.384 kg)    Not recorded      Body mass index is 33.32 kg/(m^2).  GENERAL EXAM: General: Patient is awake, alert and in no acute distress.  Well developed and groomed.  MASKED FACIES. Neck: Neck is supple. Cardiovascular: Heart is regular rate and rhythm with no murmurs.  Neurologic Exam  Mental Status: Awake, alert.  Language is fluent and comprehension intact. Cranial Nerves: Pupils are equal and reactive to light.  Visual fields are full to confrontation.  Conjugate eye movements are full and symmetric.  Facial sensation and strength are symmetric.  Hearing is intact.  Palate elevated symmetrically and uvula is midline.  Shoulder shug is symmetric.  Tongue is midline. Motor: Normal bulk and tone; MILD COGWHEELING IN LUE  WITH REINFORCEMENT.  NO REST OR POSTURAL TREMOR.  BRADYKINESIA IN BUE AND BLE (L SLOWER THAN R).  Full strength in the upper and lower extremities; EXCEPT LIMITED  IN RIGHT ARM DUE TO CAST Sensory: Intact and symmetric to light touch. Coordination: No ataxia or dysmetria on finger-nose or rapid alternating movement testing. Gait and Station: CAUTIOUS GAIT. SLOW TO RISE, AND NEEDS HELP TO STAND UP.  SLOW, DECR ARM SWING. SLOW TURNING. VERY UNSTEADY. Reflexes: Deep tendon reflexes in the upper extremity are present and symmetric; ABSENT AT KNEES AND ANKLES.   DIAGNOSTIC DATA (LABS, IMAGING, TESTING) - I reviewed patient records, labs, notes, testing and imaging myself where available.  Lab Results  Component Value Date   WBC 9.9 07/18/2012   HGB 9.8* 07/18/2012   HCT 29.6* 07/18/2012   MCV 88.1 07/18/2012   PLT 289 07/18/2012      Component Value Date/Time   NA 143 10/27/2014 0914   K 4.4 10/27/2014 0914   CL 110 10/27/2014 0914   CO2 26 10/27/2014 0914   GLUCOSE 90 10/27/2014 0914   BUN 21 10/27/2014 0914   CREATININE 0.7 10/27/2014 0914   CALCIUM 9.2 10/27/2014 0914   PROT 6.5 05/22/2010 0923   ALBUMIN 3.9 05/22/2010 0923   AST 29 05/22/2010 0923   ALT 22 05/22/2010 0923   ALKPHOS 77 05/22/2010 0923   BILITOT 0.2* 05/22/2010 0923   GFRNONAA 81* 03/14/2012 0857   GFRAA >90 03/14/2012 0857   No results found for: CHOL No results found for: HGBA1C No results found for: VITAMINB12 No results found for: TSH  11/10/10 MRI brain - mild atrophy and minimal chronic small vessel ischemic disease.   ASSESSMENT AND PLAN  81 y.o. female with hypertension, hypercholesterolemia, hypothyroidism, fibromyalgia, restless leg syndrome and parkinson's disease.  Some wearing off in early evening. More drooling, foot pain (neuropathy), dysphagia. More memory loss problems.   Dx: idiopathic parkinson's disease (akinetic/rigid) + RLS + memory loss  PLAN: 1. Continue pramipexole 1mg  TID 2. Change carb/levo to 2 tabs TID 3. Continue using rolling walker at all times; increase supervision and assistance to avoid falls/injuries 4. [facility] evaluation at  [facility] 5. Will check cognitive testing at next visit  Return in about 3 months (around 06/02/2015) for MMSE next visit.  I spent 40 minutes of face to face time with patient. Greater than 50% of time was spent in counseling and coordination of care with patient. We discussed parkinson's diagnosis, prognosis, treatment, genetics, environmental risks, and development of cognitive / dementia issues. I reviewed findings with patient, husband and daughter.   [name], MD 1/44/3154, 0:08 AM Certified in Neurology, Neurophysiology and Neuroimaging  [facility] [address], [place], [place] [address] [phone]

## 2015-03-02 NOTE — Patient Instructions (Signed)
Change carbidopa/levodopa to 2 tabs three times per day.  Continue pramipexole 1mg  three times per day.  Use walker at all times.

## 2015-03-03 DIAGNOSIS — M8580 Other specified disorders of bone density and structure, unspecified site: Secondary | ICD-10-CM | POA: Diagnosis not present

## 2015-03-03 DIAGNOSIS — M8000XA Age-related osteoporosis with current pathological fracture, unspecified site, initial encounter for fracture: Secondary | ICD-10-CM | POA: Diagnosis not present

## 2015-03-03 DIAGNOSIS — M810 Age-related osteoporosis without current pathological fracture: Secondary | ICD-10-CM | POA: Diagnosis not present

## 2015-03-07 ENCOUNTER — Ambulatory Visit: Payer: Medicare Other | Admitting: Diagnostic Neuroimaging

## 2015-03-11 HISTORY — PX: FRACTURE SURGERY: SHX138

## 2015-03-15 DIAGNOSIS — S52531D Colles' fracture of right radius, subsequent encounter for closed fracture with routine healing: Secondary | ICD-10-CM | POA: Diagnosis not present

## 2015-03-24 DIAGNOSIS — N200 Calculus of kidney: Secondary | ICD-10-CM | POA: Diagnosis not present

## 2015-03-24 DIAGNOSIS — Z09 Encounter for follow-up examination after completed treatment for conditions other than malignant neoplasm: Secondary | ICD-10-CM | POA: Diagnosis not present

## 2015-03-24 DIAGNOSIS — N390 Urinary tract infection, site not specified: Secondary | ICD-10-CM | POA: Insufficient documentation

## 2015-03-24 DIAGNOSIS — R312 Other microscopic hematuria: Secondary | ICD-10-CM | POA: Diagnosis not present

## 2015-03-24 DIAGNOSIS — Z87442 Personal history of urinary calculi: Secondary | ICD-10-CM | POA: Diagnosis not present

## 2015-03-24 DIAGNOSIS — R3129 Other microscopic hematuria: Secondary | ICD-10-CM | POA: Insufficient documentation

## 2015-03-24 DIAGNOSIS — R8290 Unspecified abnormal findings in urine: Secondary | ICD-10-CM | POA: Diagnosis not present

## 2015-03-24 HISTORY — DX: Other microscopic hematuria: R31.29

## 2015-03-29 ENCOUNTER — Telehealth: Payer: Self-pay | Admitting: *Deleted

## 2015-03-29 DIAGNOSIS — S52531P Colles' fracture of right radius, subsequent encounter for closed fracture with malunion: Secondary | ICD-10-CM | POA: Diagnosis not present

## 2015-03-30 NOTE — Telephone Encounter (Signed)
Called and left a message for Tiffany at [facility]. Per Dr. Leta Baptist, she is under the care of Dr. Nyoka Cowden and all orders should come from him. Told her to call back with any further questions.

## 2015-03-31 ENCOUNTER — Other Ambulatory Visit: Payer: Self-pay

## 2015-03-31 MED ORDER — DULOXETINE HCL 20 MG PO CPEP: 20.0000 mg | ORAL_CAPSULE | Freq: Every day | ORAL | Status: DC

## 2015-03-31 NOTE — Telephone Encounter (Signed)
Per phone note on 09/29

## 2015-04-04 DIAGNOSIS — S52501P Unspecified fracture of the lower end of right radius, subsequent encounter for closed fracture with malunion: Secondary | ICD-10-CM | POA: Diagnosis not present

## 2015-04-04 DIAGNOSIS — Y929 Unspecified place or not applicable: Secondary | ICD-10-CM | POA: Diagnosis not present

## 2015-04-04 DIAGNOSIS — X58XXXA Exposure to other specified factors, initial encounter: Secondary | ICD-10-CM | POA: Diagnosis not present

## 2015-04-04 DIAGNOSIS — G5601 Carpal tunnel syndrome, right upper limb: Secondary | ICD-10-CM | POA: Diagnosis not present

## 2015-04-04 DIAGNOSIS — G8918 Other acute postprocedural pain: Secondary | ICD-10-CM | POA: Diagnosis not present

## 2015-04-04 DIAGNOSIS — S52531P Colles' fracture of right radius, subsequent encounter for closed fracture with malunion: Secondary | ICD-10-CM | POA: Diagnosis not present

## 2015-04-07 DIAGNOSIS — Z87442 Personal history of urinary calculi: Secondary | ICD-10-CM | POA: Diagnosis not present

## 2015-04-07 DIAGNOSIS — Z87440 Personal history of urinary (tract) infections: Secondary | ICD-10-CM | POA: Diagnosis not present

## 2015-04-07 DIAGNOSIS — Z4789 Encounter for other orthopedic aftercare: Secondary | ICD-10-CM | POA: Diagnosis not present

## 2015-04-08 ENCOUNTER — Other Ambulatory Visit: Payer: Self-pay

## 2015-04-08 MED ORDER — PRAMIPEXOLE DIHYDROCHLORIDE 1 MG PO TABS: 1.0000 mg | ORAL_TABLET | Freq: Three times a day (TID) | ORAL | Status: DC

## 2015-04-08 NOTE — Telephone Encounter (Signed)
OV note says: 1. Continue pramipexole 1mg  TID

## 2015-04-11 DIAGNOSIS — Z4789 Encounter for other orthopedic aftercare: Secondary | ICD-10-CM | POA: Diagnosis not present

## 2015-04-11 DIAGNOSIS — S52531P Colles' fracture of right radius, subsequent encounter for closed fracture with malunion: Secondary | ICD-10-CM | POA: Diagnosis not present

## 2015-04-12 DIAGNOSIS — M545 Low back pain: Secondary | ICD-10-CM | POA: Diagnosis not present

## 2015-04-18 DIAGNOSIS — Z4789 Encounter for other orthopedic aftercare: Secondary | ICD-10-CM | POA: Diagnosis not present

## 2015-04-18 DIAGNOSIS — S52531D Colles' fracture of right radius, subsequent encounter for closed fracture with routine healing: Secondary | ICD-10-CM | POA: Diagnosis not present

## 2015-04-20 DIAGNOSIS — F419 Anxiety disorder, unspecified: Secondary | ICD-10-CM | POA: Diagnosis not present

## 2015-04-20 DIAGNOSIS — F4542 Pain disorder with related psychological factors: Secondary | ICD-10-CM | POA: Diagnosis not present

## 2015-04-22 DIAGNOSIS — H903 Sensorineural hearing loss, bilateral: Secondary | ICD-10-CM | POA: Diagnosis not present

## 2015-04-22 DIAGNOSIS — H6123 Impacted cerumen, bilateral: Secondary | ICD-10-CM | POA: Diagnosis not present

## 2015-04-22 DIAGNOSIS — Z974 Presence of external hearing-aid: Secondary | ICD-10-CM | POA: Diagnosis not present

## 2015-04-23 NOTE — Progress Notes (Signed)
Cardiology Office Note   Date:  04/25/2015   ID:  [name], DOB Feb 18, 1931, MRN [id number]  PCP:  [name], MD    Chief Complaint  Patient presents with  . Congestive Heart Failure  . Follow-up    hypertension      History of Present Illness: [name] is a 81 y.o. female with a history of chronic SOB secondary to diastolic dysfunction, chronic diastolic CHF, deconditioning and obesity. She has had chronic SOB for years and a heart cath in 2006 showed normal coronary arteries and normal LVF with normal LVEDP. She also has a history of HTN, chronic LE edema and PVD followed by [name]. She is doing well from a cardiac standpoint. She denies any chest pain, dizziness, palpitations or syncope. She has chronic LE edema. She has chronic SOB which is stable and has not changed any since I saw her last.    Past Medical History  Diagnosis Date  . Hypothyroidism   . Shortness of breath   . H/O hiatal hernia   . Arthritis   . Anxiety   . Neuropathy   . Tremor   . Complication of anesthesia     nigthmares, hallucinations   . Anemia   . Osteoporosis   . CHF (congestive heart failure)   . Parkinson disease   . Fibromyalgia   . Restless leg   . Sjogren's disease   . Hypertension     dr t turner  . GERD (gastroesophageal reflux disease)   . Hypercholesterolemia   . Diastolic dysfunction   . SOB (shortness of breath)     chronic due to diastolic dysfunction, deconditioning, obesity  . PVD (peripheral vascular disease)     99% stenosis of left anteiror tibial artery, mod stenosis of left distal SFA and popliteal artery followed by [name]  . Barrett esophagus   . Chronic diastolic CHF (congestive heart failure)   . Broken arm     right     Past Surgical History  Procedure Laterality Date  . Abdominal hysterectomy    . Back surgery      3 back surgeries,   lumbar fusion  . Wrist fracture surgery    . Eye surgery      cateracts  .  Falls      variious fall, broken wrist,and toes  . Total hip arthroplasty      right  . Cardiac catheterization  2006    normal  . Spine surgery  April 2013    Back X's 4     Current Outpatient Prescriptions  Medication Sig Dispense Refill  . aspirin 81 MG tablet Take 81 mg by mouth daily.    . carbidopa-levodopa (SINEMET IR) 25-100 MG per tablet Take 2 tablets by mouth 3 (three) times daily. 180 tablet 12  . CELEBREX 200 MG capsule 200 mg 2 (two) times daily.     . Cholecalciferol (VITAMIN D3) 2000 UNITS TABS Take 1 tablet by mouth daily.    Marland Kitchen co-enzyme Q-10 30 MG capsule Take 100 mg by mouth 3 (three) times daily.    Marland Kitchen denosumab (PROLIA) 60 MG/ML SOLN injection Inject 60 mg into the skin every 6 (six) months. Administer in upper arm, thigh, or abdomen    . dexlansoprazole (DEXILANT) 60 MG capsule Take 60 mg by mouth daily.    Marland Kitchen diltiazem (CARDIZEM CD) 120 MG 24 hr capsule TAKE (1) CAPSULE DAILY. 90 capsule 5  . ezetimibe (ZETIA) 10  MG tablet Take 10 mg by mouth every evening.    . furosemide (LASIX) 20 MG tablet TAKE 1 TABLET ONCE DAILY. 90 tablet 1  . levothyroxine (SYNTHROID, LEVOTHROID) 50 MCG tablet Take 50 mcg by mouth daily.    Marland Kitchen lovastatin (MEVACOR) 40 MG tablet Take 40 mg by mouth at bedtime.    . metoprolol tartrate (LOPRESSOR) 25 MG tablet TAKE 1 TABLET ONCE DAILY. 90 tablet 2  . Potassium Citrate-Citric Acid 3300-1002 MG PACK Take 1 packet by mouth daily.    . pramipexole (MIRAPEX) 1 MG tablet Take 1 tablet (1 mg total) by mouth 3 (three) times daily. 90 tablet 12  . PREMARIN vaginal cream Apply 1 application topically 2 (two) times a week.  10  . traMADol (ULTRAM) 50 MG tablet Take 50 mg by mouth every 6 (six) hours as needed for moderate pain.     . traMADol (ULTRAM-ER) 200 MG 24 hr tablet Take 200 mg by mouth daily.     No current facility-administered medications for this visit.    Allergies:   Codeine; Lactose intolerance (gi); Lyrica; Other; Plaquenil; Reglan;  Requip; and Septra    Social History:  The patient  reports that she has never smoked. She has never used smokeless tobacco. She reports that she does not drink alcohol or use illicit drugs.   Family History:  The patient's family history includes COPD in her father; Heart attack in her mother; Heart disease in her mother; Heart failure in her mother; Hypertension in her mother; Restless legs syndrome in her mother.    ROS:  Please see the history of present illness.   Otherwise, review of systems are positive for none.   All other systems are reviewed and negative.    PHYSICAL EXAM: VS:  BP 100/58 mmHg  Pulse 69  Ht 4' 11.5" (1.511 m)  Wt 165 lb (74.844 kg)  BMI 32.78 kg/m2  SpO2 98% , BMI Body mass index is 32.78 kg/(m^2). GEN: Well nourished, well developed, in no acute distress HEENT: normal Neck: no JVD, carotid bruits, or masses Cardiac: RRR; no  rubs, or gallops.  Trace LLE edema.  1/6 SM at RUSB Respiratory:  clear to auscultation bilaterally, normal work of breathing GI: soft, nontender, nondistended, + BS MS: no deformity or atrophy Skin: warm and dry, no rash Neuro:  Strength and sensation are intact Psych: euthymic mood, full affect   EKG:  EKG is not ordered today.    Recent Labs: 10/27/2014: BUN 21; Creatinine 0.7; Potassium 4.4; Sodium 143    Lipid Panel No results found for: CHOL, TRIG, HDL, CHOLHDL, VLDL, LDLCALC, LDLDIRECT    Wt Readings from Last 3 Encounters:  04/25/15 165 lb (74.844 kg)  03/02/15 170 lb 9.6 oz (77.384 kg)  12/23/14 162 lb (73.483 kg)     ASSESSMENT AND PLAN:  1. Chronic diastolic CHF - continue Lasix/beta blocker/cardizem - check BMET 2. Chronic SOB multifactorial from obesity, deconditioning and diastolic dysfunction. Nuclear stress test 05/2013 with no ischemia. She has had chronic SOB dating back to 2006 at the time of her cath that showed normal coronary arteries, normal LVF and normal LVEDP.  3. HTN - continue  Cardizem and Metoprolol    Current medicines are reviewed at length with the patient today.  The patient does not have concerns regarding medicines.  The following changes have been made:  no change  Labs/ tests ordered today include: see above assessment and plan No orders of the defined types were  placed in this encounter.     Disposition:   FU with me in 6 months   Signed, [name], MD  04/25/2015 8:29 AM    [facility] [place], [place], [place]  [address] Phone: [phone]; Fax: [phone]

## 2015-04-25 ENCOUNTER — Encounter: Payer: Self-pay | Admitting: Cardiology

## 2015-04-25 ENCOUNTER — Ambulatory Visit (INDEPENDENT_AMBULATORY_CARE_PROVIDER_SITE_OTHER): Payer: Medicare Other | Admitting: Cardiology

## 2015-04-25 VITALS — BP 100/58 | HR 69 | Ht 59.5 in | Wt 165.0 lb

## 2015-04-25 DIAGNOSIS — I10 Essential (primary) hypertension: Secondary | ICD-10-CM | POA: Diagnosis not present

## 2015-04-25 DIAGNOSIS — I5032 Chronic diastolic (congestive) heart failure: Secondary | ICD-10-CM

## 2015-04-25 DIAGNOSIS — I70219 Atherosclerosis of native arteries of extremities with intermittent claudication, unspecified extremity: Secondary | ICD-10-CM | POA: Diagnosis not present

## 2015-04-25 DIAGNOSIS — R0602 Shortness of breath: Secondary | ICD-10-CM | POA: Diagnosis not present

## 2015-04-25 LAB — BASIC METABOLIC PANEL
BUN: 22 mg/dL (ref 6–23)
CALCIUM: 9.5 mg/dL (ref 8.4–10.5)
CO2: 27 mEq/L (ref 19–32)
Chloride: 104 mEq/L (ref 96–112)
Creatinine, Ser: 0.91 mg/dL (ref 0.40–1.20)
GFR: 62.97 mL/min (ref >60.00)
Glucose, Bld: 109 mg/dL — ABNORMAL HIGH (ref 70–99)
Potassium: 4.2 mEq/L (ref 3.5–5.1)
Sodium: 137 mEq/L (ref 135–145)

## 2015-04-25 NOTE — Patient Instructions (Signed)

## 2015-05-02 DIAGNOSIS — S52531D Colles' fracture of right radius, subsequent encounter for closed fracture with routine healing: Secondary | ICD-10-CM | POA: Diagnosis not present

## 2015-05-09 DIAGNOSIS — R29898 Other symptoms and signs involving the musculoskeletal system: Secondary | ICD-10-CM | POA: Diagnosis not present

## 2015-05-09 DIAGNOSIS — K449 Diaphragmatic hernia without obstruction or gangrene: Secondary | ICD-10-CM | POA: Diagnosis not present

## 2015-05-09 DIAGNOSIS — E785 Hyperlipidemia, unspecified: Secondary | ICD-10-CM | POA: Diagnosis not present

## 2015-05-09 DIAGNOSIS — S52501S Unspecified fracture of the lower end of right radius, sequela: Secondary | ICD-10-CM | POA: Diagnosis not present

## 2015-05-09 DIAGNOSIS — G5601 Carpal tunnel syndrome, right upper limb: Secondary | ICD-10-CM | POA: Diagnosis not present

## 2015-05-09 DIAGNOSIS — M6281 Muscle weakness (generalized): Secondary | ICD-10-CM | POA: Diagnosis not present

## 2015-05-09 DIAGNOSIS — G2581 Restless legs syndrome: Secondary | ICD-10-CM | POA: Diagnosis not present

## 2015-05-09 DIAGNOSIS — E039 Hypothyroidism, unspecified: Secondary | ICD-10-CM | POA: Diagnosis not present

## 2015-05-12 DIAGNOSIS — Z791 Long term (current) use of non-steroidal anti-inflammatories (NSAID): Secondary | ICD-10-CM | POA: Diagnosis not present

## 2015-05-12 DIAGNOSIS — M5489 Other dorsalgia: Secondary | ICD-10-CM | POA: Diagnosis not present

## 2015-05-12 DIAGNOSIS — M19049 Primary osteoarthritis, unspecified hand: Secondary | ICD-10-CM | POA: Diagnosis not present

## 2015-05-12 DIAGNOSIS — M3501 Sicca syndrome with keratoconjunctivitis: Secondary | ICD-10-CM | POA: Diagnosis not present

## 2015-05-16 DIAGNOSIS — S52501S Unspecified fracture of the lower end of right radius, sequela: Secondary | ICD-10-CM | POA: Diagnosis not present

## 2015-05-16 DIAGNOSIS — E785 Hyperlipidemia, unspecified: Secondary | ICD-10-CM | POA: Diagnosis not present

## 2015-05-16 DIAGNOSIS — E039 Hypothyroidism, unspecified: Secondary | ICD-10-CM | POA: Diagnosis not present

## 2015-05-16 DIAGNOSIS — M6281 Muscle weakness (generalized): Secondary | ICD-10-CM | POA: Diagnosis not present

## 2015-05-16 DIAGNOSIS — K449 Diaphragmatic hernia without obstruction or gangrene: Secondary | ICD-10-CM | POA: Diagnosis not present

## 2015-05-16 DIAGNOSIS — R29898 Other symptoms and signs involving the musculoskeletal system: Secondary | ICD-10-CM | POA: Diagnosis not present

## 2015-05-16 DIAGNOSIS — G2581 Restless legs syndrome: Secondary | ICD-10-CM | POA: Diagnosis not present

## 2015-05-16 DIAGNOSIS — G5601 Carpal tunnel syndrome, right upper limb: Secondary | ICD-10-CM | POA: Diagnosis not present

## 2015-06-01 ENCOUNTER — Ambulatory Visit: Payer: BLUE CROSS/BLUE SHIELD | Admitting: Diagnostic Neuroimaging

## 2015-06-02 ENCOUNTER — Encounter (HOSPITAL_COMMUNITY): Payer: Medicare Other

## 2015-06-02 ENCOUNTER — Ambulatory Visit: Payer: Medicare Other | Admitting: Family

## 2015-06-06 ENCOUNTER — Other Ambulatory Visit: Payer: Self-pay

## 2015-06-14 ENCOUNTER — Other Ambulatory Visit: Payer: Self-pay | Admitting: Cardiology

## 2015-06-15 DIAGNOSIS — Z1389 Encounter for screening for other disorder: Secondary | ICD-10-CM | POA: Diagnosis not present

## 2015-06-15 DIAGNOSIS — M797 Fibromyalgia: Secondary | ICD-10-CM | POA: Diagnosis not present

## 2015-06-15 DIAGNOSIS — G609 Hereditary and idiopathic neuropathy, unspecified: Secondary | ICD-10-CM | POA: Diagnosis not present

## 2015-06-15 DIAGNOSIS — G2581 Restless legs syndrome: Secondary | ICD-10-CM | POA: Diagnosis not present

## 2015-06-15 DIAGNOSIS — M3500 Sicca syndrome, unspecified: Secondary | ICD-10-CM | POA: Diagnosis not present

## 2015-06-15 DIAGNOSIS — I739 Peripheral vascular disease, unspecified: Secondary | ICD-10-CM | POA: Diagnosis not present

## 2015-06-15 DIAGNOSIS — E782 Mixed hyperlipidemia: Secondary | ICD-10-CM | POA: Diagnosis not present

## 2015-06-15 DIAGNOSIS — I10 Essential (primary) hypertension: Secondary | ICD-10-CM | POA: Diagnosis not present

## 2015-06-15 DIAGNOSIS — G894 Chronic pain syndrome: Secondary | ICD-10-CM | POA: Diagnosis not present

## 2015-06-15 DIAGNOSIS — I5032 Chronic diastolic (congestive) heart failure: Secondary | ICD-10-CM | POA: Diagnosis not present

## 2015-06-15 DIAGNOSIS — E039 Hypothyroidism, unspecified: Secondary | ICD-10-CM | POA: Diagnosis not present

## 2015-06-15 DIAGNOSIS — Z0000 Encounter for general adult medical examination without abnormal findings: Secondary | ICD-10-CM | POA: Diagnosis not present

## 2015-06-20 DIAGNOSIS — H52203 Unspecified astigmatism, bilateral: Secondary | ICD-10-CM | POA: Diagnosis not present

## 2015-06-20 DIAGNOSIS — H532 Diplopia: Secondary | ICD-10-CM | POA: Diagnosis not present

## 2015-06-21 ENCOUNTER — Ambulatory Visit: Payer: BLUE CROSS/BLUE SHIELD | Admitting: Diagnostic Neuroimaging

## 2015-06-21 DIAGNOSIS — S52531D Colles' fracture of right radius, subsequent encounter for closed fracture with routine healing: Secondary | ICD-10-CM | POA: Diagnosis not present

## 2015-06-27 ENCOUNTER — Encounter: Payer: Self-pay | Admitting: Family

## 2015-06-28 ENCOUNTER — Ambulatory Visit: Payer: BLUE CROSS/BLUE SHIELD | Admitting: Diagnostic Neuroimaging

## 2015-06-29 ENCOUNTER — Encounter: Payer: Self-pay | Admitting: Family

## 2015-06-29 ENCOUNTER — Ambulatory Visit (INDEPENDENT_AMBULATORY_CARE_PROVIDER_SITE_OTHER): Payer: Medicare Other | Admitting: Family

## 2015-06-29 ENCOUNTER — Ambulatory Visit (HOSPITAL_COMMUNITY)
Admission: RE | Admit: 2015-06-29 | Discharge: 2015-06-29 | Disposition: A | Discharge status: Home / Self Care | Payer: Medicare Other | Source: Ambulatory Visit | Attending: Family | Admitting: Family

## 2015-06-29 VITALS — BP 125/67 | HR 69 | Temp 97.9°F | Resp 16 | Ht 59.0 in | Wt 162.0 lb

## 2015-06-29 DIAGNOSIS — I70219 Atherosclerosis of native arteries of extremities with intermittent claudication, unspecified extremity: Secondary | ICD-10-CM | POA: Diagnosis not present

## 2015-06-29 DIAGNOSIS — I779 Disorder of arteries and arterioles, unspecified: Secondary | ICD-10-CM

## 2015-06-29 NOTE — Progress Notes (Signed)
[facility] [place] HISTORY AND PHYSICAL -PAD  History of Present Illness [name] is a 81 y.o. female patient of [name] followed for evaluation of lower extremity edema as well as a history of mild claudication. She has a history of tight stenosis in her left anterior tibial artery as well as popliteal occlusive disease.  She returns today for 6 months follow up. She does not have claudication symptoms, her walking is limited by dyspnea.  She denies non-healing wounds.   She had left first ingrown toenail removed about 2012, since then she has redness at the tip of this toe. Her podiatrist is Dr. Paulino Door. She has a remote history of left metatarsal fracture.  She has pins and needles feelings at toes, plantar surfaces, and heels of both feet, mostly at night.  Had 4 lumbar spine surgeries and has what sounds like right sciatic pain at times.   Her appetite is not good lately, and is losing weight, denies food fear or abdominal pain after eating; is seeing a gastroenterologist, states she has a large hiatal hernia.  New Medical or Surgical History: fractured her right wrist in March 2016, had ORIF, just came back [place] after visiting friends and family of a close friend that died. Has recently developed kidney stones, will be seeing a urologist, Dr. Kendell Bane at [name].   She has multiple areas of muscoskeletal type pain that she attributes to her Sjogren's Syndrome and Parkinson's Disease.  Dr.Penumalli is her neurologist.   Pt Diabetic: No  Pt smoker: non-smoker   Pt meds include:  Statin :Yes  Betablocker: Yes  ASA: Yes  Other anticoagulants/antiplatelets: no     Past Medical History  Diagnosis Date  . Hypothyroidism   . Shortness of breath   . H/O hiatal hernia   . Arthritis   . Anxiety   . Neuropathy   . Tremor   . Complication of anesthesia     nigthmares, hallucinations   . Anemia   . Osteoporosis   . CHF  (congestive heart failure)   . Parkinson disease   . Fibromyalgia   . Restless leg   . Sjogren's disease   . Hypertension     dr t turner  . GERD (gastroesophageal reflux disease)   . Hypercholesterolemia   . Diastolic dysfunction   . SOB (shortness of breath)     chronic due to diastolic dysfunction, deconditioning, obesity  . PVD (peripheral vascular disease)     99% stenosis of left anteiror tibial artery, mod stenosis of left distal SFA and popliteal artery followed by [name]  . Barrett esophagus   . Chronic diastolic CHF (congestive heart failure)   . Broken arm     right     Social History History  Substance Use Topics  . Smoking status: Never Smoker   . Smokeless tobacco: Never Used  . Alcohol Use: No    Family History Family History  Problem Relation Age of Onset  . Heart attack Mother   . Restless legs syndrome Mother   . Heart failure Mother   . Heart disease Mother   . Hypertension Mother   . COPD Father     Past Surgical History  Procedure Laterality Date  . Abdominal hysterectomy    . Back surgery      3 back surgeries,   lumbar fusion  . Wrist fracture surgery    . Eye surgery      cateracts  .  Falls      variious fall, broken wrist,and toes  . Total hip arthroplasty      right  . Cardiac catheterization  2006    normal  . Spine surgery  April 2013    Back X's 4    Allergies  Allergen Reactions  . Codeine     headache  . Lactose Intolerance (Gi)     Reactions to meds that contain lactose derivitives and foods  . Lyrica [Pregabalin]     Leg edema   . Other Other (See Comments)    Pain meds received after surgery caused nightmares (Percocet,Hydrocodone,Morphine)  . Plaquenil [Hydroxychloroquine Sulfate]     Stomach upset  . Reglan [Metoclopramide] Other (See Comments)    Stomach upset  . Requip [Ropinirole Hcl]     Stomach upset   . Septra [Sulfamethoxazole-Trimethoprim] Nausea Only    Current Outpatient Prescriptions   Medication Sig Dispense Refill  . aspirin 81 MG tablet Take 81 mg by mouth daily.    . carbidopa-levodopa (SINEMET IR) 25-100 MG per tablet Take 2 tablets by mouth 3 (three) times daily. 180 tablet 12  . CELEBREX 200 MG capsule 200 mg 2 (two) times daily.     . Cholecalciferol (VITAMIN D3) 2000 UNITS TABS Take 1 tablet by mouth daily.    Marland Kitchen co-enzyme Q-10 30 MG capsule Take 100 mg by mouth 3 (three) times daily.    Marland Kitchen denosumab (PROLIA) 60 MG/ML SOLN injection Inject 60 mg into the skin every 6 (six) months. Administer in upper arm, thigh, or abdomen    . dexlansoprazole (DEXILANT) 60 MG capsule Take 60 mg by mouth daily.    Marland Kitchen diltiazem (CARDIZEM CD) 120 MG 24 hr capsule TAKE (1) CAPSULE DAILY. 90 capsule 0  . ezetimibe (ZETIA) 10 MG tablet Take 10 mg by mouth every evening.    . furosemide (LASIX) 20 MG tablet TAKE 1 TABLET ONCE DAILY. 90 tablet 1  . levothyroxine (SYNTHROID, LEVOTHROID) 50 MCG tablet Take 50 mcg by mouth daily.    Marland Kitchen lovastatin (MEVACOR) 40 MG tablet Take 40 mg by mouth at bedtime.    . metoprolol tartrate (LOPRESSOR) 25 MG tablet TAKE 1 TABLET ONCE DAILY. 90 tablet 2  . Potassium Citrate-Citric Acid 3300-1002 MG PACK Take 1 packet by mouth daily.    . pramipexole (MIRAPEX) 1 MG tablet Take 1 tablet (1 mg total) by mouth 3 (three) times daily. 90 tablet 12  . PREMARIN vaginal cream Apply 1 application topically 2 (two) times a week.  10  . traMADol (ULTRAM) 50 MG tablet Take 50 mg by mouth every 6 (six) hours as needed for moderate pain.     . traMADol (ULTRAM-ER) 200 MG 24 hr tablet Take 200 mg by mouth daily.     No current facility-administered medications for this visit.    ROS: See HPI for pertinent positives and negatives.   Physical Examination  Filed Vitals:   06/29/15 0923  BP: 125/67  Pulse: 69  Temp: 97.9 F (36.6 C)  TempSrc: Oral  Resp: 16  Height: 4\' 11"  (1.499 m)  Weight: 162 lb (73.483 kg)  SpO2: 96%   Body mass index is 32.7  kg/(m^2).   General: A&O x 3, WDWN, obese female.  Gait: normal  Eyes: PERRLA,  Pulmonary: CTAB, without wheezes , rales or rhonchi  Cardiac: regular Rhythm, + murmur   Carotid Bruits  Left  Right    Negative  Negative    Aorta: is not palpable  Radial pulses: 2+ right, 1+ left  VASCULAR EXAM:  Extremities without ischemic changes.  without Gangrene; without open wounds. .  LE Pulses  LEFT  RIGHT   FEMORAL  Tender to palpation, no swelling or discoloration  2+ palpable   POPLITEAL  1+ palpable  1+palpable   POSTERIOR TIBIAL  not palpable  not palpable   DORSALIS PEDIS  ANTERIOR TIBIAL  1+ palpable  not palpable    Abdomen: soft, NT, no palpable masses, dry mouth. Skin: no rashes, no ulcers.  Musculoskeletal: no muscle wasting or atrophy, tender to palpation at multiple areas.  Neurologic: A&O X 3; Appropriate Affect, MOTOR FUNCTION: moving all extremities equally, motor strength 4/5 throughout. Speech is fluent/normal. CN 2-12 intact.        Non-Invasive Vascular Imaging: DATE: 06/29/2015 ABI: RIGHT: [place] (12/23/14, Katherine), Waveforms: triphasic, TBI: 0.97;  LEFT: 1.2 ([place]), Waveforms: triphasic, TBI: 0.71  ASSESSMENT: LONDIN ANTONE is a 81 y.o. female whom we are following for lower extremity edema as well as a history of mild claudication. She has a history of tight stenosis in her left anterior tibial artery as well as popliteal occlusive disease.  She returns today for 6 months follow up. She does not have claudication symptoms, her walking is limited by dyspnea.  She denies non-healing wounds.  Today's ABI's are not reliable as her vessels are non compressible. Her TBI's are both normal and all waveforms are triphasic.    PLAN:  Based on the patient's vascular studies and examination, pt will return to clinic in 1 year with ABI's.  I discussed in depth with the patient the nature of atherosclerosis, and  emphasized the importance of maximal medical management including strict control of blood pressure, blood glucose, and lipid levels, obtaining regular exercise, and continued cessation of smoking.  The patient is aware that without maximal medical management the underlying atherosclerotic disease process will progress, limiting the benefit of any interventions.  The patient was given information about PAD including signs, symptoms, treatment, what symptoms should prompt the patient to seek immediate medical care, and risk reduction measures to take.  Clemon Chambers, RN, MSN, FNP-C [facility] [employer] Phone: [phone]  Clinic MD: Scot Dock  06/29/2015 9:24 AM

## 2015-06-29 NOTE — Patient Instructions (Signed)

## 2015-07-06 ENCOUNTER — Ambulatory Visit (INDEPENDENT_AMBULATORY_CARE_PROVIDER_SITE_OTHER): Payer: Medicare Other | Admitting: Diagnostic Neuroimaging

## 2015-07-06 ENCOUNTER — Ambulatory Visit: Payer: BLUE CROSS/BLUE SHIELD | Admitting: Diagnostic Neuroimaging

## 2015-07-06 ENCOUNTER — Encounter: Payer: Self-pay | Admitting: Diagnostic Neuroimaging

## 2015-07-06 VITALS — BP 118/63 | HR 78 | Ht 59.0 in | Wt 154.2 lb

## 2015-07-06 DIAGNOSIS — I779 Disorder of arteries and arterioles, unspecified: Secondary | ICD-10-CM | POA: Diagnosis not present

## 2015-07-06 DIAGNOSIS — G20 Parkinson's disease: Secondary | ICD-10-CM

## 2015-07-06 NOTE — Progress Notes (Signed)
[facility]  PATIENT: [name] DOB: September 15, 1931  REFERRING CLINICIAN:  HISTORY FROM: patient and husband and daughter REASON FOR VISIT: follow up   HISTORICAL  CHIEF COMPLAINT:  Chief Complaint  Patient presents with  . Follow-up    PD    HISTORY OF PRESENT ILLNESS:   UPDATE 07/06/15: More issues with kidney stones, UTIs, shoulder pain. No wearing off. No tremor. More balance and freezing issues. Short term memory problems continue. Patient is in denial about need to use walker and her own memory problems.   UPDATE 03/02/15: Since last visit, patient's balance has worsened. She fell recently (02/26/15), with right non-displaced distal right radial fracture. Notes more tremor and wearing off around 4pm everyday. Currently on carb/levo 1.5tab TID and pramipexole 1mg  TID. Sometimes takes a 4th dose of carb/levo 1.5tabs. Had been using a walker, mainly at night time, but did not use at time of fall.  UPDATE 09/03/14: Since last visit, tremor is well controlled. Gait and freezing are stable,. Doing well on (carb/levo 1.5 tabs + pramipexole 1mg ) taken TID (sometimes QID, with extra late evening dose if wearing off is notable). Having more foot pain, numbness, drooling prob, flushing, anxiety. Saw psychology last visit, then rec to see psychiatry, but sxs improved and the psychiatrist who was recommended to them was not taking new patients.   UPDATE 02/24/14: Since last visit, has increased carb/levo up to 1 tab 4x per day; has also incr pramipexole up to 1mg  4x per day (for convenience of remembering, even though we normally dose BID or TID). Also with more anxiety, decr confidence, more tremor in right upper ext. Also reports some right shoulder, bilateral knee, low back pain. Has noticed some freezing of gait. Has appt with psychology later today.  UPDATE 08/26/13: Since last visit, noticing some wearing off in early evening. Takes carb/levo 6:30am, noon, 9pm. Also  getting more "worked up" lately, reading about PD online. No falls. Uses cane and walker. More fatigue and mental exhaustion.  UPDATE 02/04/13: Since last visit, has had some minor falls. tremor and PD sxs better on carb/levo. No anxiety. Some drooling and hypophonia. Not using walker or cane all the time. constipation stable.  UPDATE 10/06/12: Since last visit, had another back surgery (april 2013), dx'd with CHF, dx'd with sjogrens, also with fall in August 2013 with pelvic fracture. Reports good tremor control. Some freezing of gait.  UPDATE 02/13/12: Now on pramipexole 1mg  QID (7am, noon, 5pm, 10pm). Some wearing off around 5pm. Also, had rxn to forteo, now with right leg pain. Also getting lumbar radiculopathy eval per ortho.  UPDATE 08/14/11: Tremor developing around 4pm now.  More anxiety per husband.  Also with UTI x 2.  Now dx'd with left foot fractures (? "stubbed" toes; no falls).  UPDATE 04/27/11: Tolerating pramipexole 1mg  TID (7am, 1-2pm, 7p).  some wearing off right at 7p.  feels unsteady, and has ongoing back problems and pain.  UPDATE 01/24/11: Doing slightly better with pramipexole 1mg  BID.  Tremor returns later in the day before evening dose.  Still with gait and balance diff.    PRIOR HPI: 81 year old female with history of hypertension, hypercholesterolemia, hypothyroidism, fibromyalgia, restless leg syndrome, presenting for evaluation of tremor for the past 3 years. She is here with her husband. Approximately 3 years ago patient developed intermittent tremor in her left hand particularly in her thumb and fingers.  Tremor is most noticeable at rest and in the evening.  In June 2011,  patient tripped, fell and broke her left wrist and forearm.  This was repaired surgically and in a cast; during this time and since then her tremor has improved. Patient also has history of restless leg syndrome for the past 5 years. She's been on pramipexole for number of years (0.5mg  qhs) and recently  increased to 1 mg qhs a few weeks ago.  The patient's mother also had restless leg syndrome. Patient has a diagnosis of iron deficiency anemia but is not on iron replacement.   REVIEW OF SYSTEMS: Full 14 system review of systems performed and notable only for fatigue appetite excessive sweating trouble swallowing drooling murmur leg swelling restless leg daytime sleepiness sleep talking joint pain joint swelling neck pain itching agitation confusion anxiety tremors easy bruising painful urination excess thirst dry mouth.    ALLERGIES: Allergies  Allergen Reactions  . Codeine     headache  . Lactose Intolerance (Gi)     Reactions to meds that contain lactose derivitives and foods  . Lyrica [Pregabalin]     Leg edema   . Other Other (See Comments)    Pain meds received after surgery caused nightmares (Percocet,Hydrocodone,Morphine)  . Plaquenil [Hydroxychloroquine Sulfate]     Stomach upset  . Reglan [Metoclopramide] Other (See Comments)    Stomach upset  . Requip [Ropinirole Hcl]     Stomach upset   . Septra [Sulfamethoxazole-Trimethoprim] Nausea Only    HOME MEDICATIONS: Outpatient Prescriptions Prior to Visit  Medication Sig Dispense Refill  . aspirin 81 MG tablet Take 81 mg by mouth daily.    . carbidopa-levodopa (SINEMET IR) 25-100 MG per tablet Take 2 tablets by mouth 3 (three) times daily. 180 tablet 12  . CELEBREX 200 MG capsule 200 mg 2 (two) times daily.     . Cholecalciferol (VITAMIN D3) 2000 UNITS TABS Take 1 tablet by mouth daily.    Marland Kitchen co-enzyme Q-10 30 MG capsule Take 100 mg by mouth daily.     Marland Kitchen denosumab (PROLIA) 60 MG/ML SOLN injection Inject 60 mg into the skin every 6 (six) months. Administer in upper arm, thigh, or abdomen    . dexlansoprazole (DEXILANT) 60 MG capsule Take 60 mg by mouth daily.    Marland Kitchen diltiazem (CARDIZEM CD) 120 MG 24 hr capsule TAKE (1) CAPSULE DAILY. 90 capsule 0  . DULoxetine (CYMBALTA) 20 MG capsule   2  . ezetimibe (ZETIA) 10 MG tablet  Take 10 mg by mouth every evening.    . furosemide (LASIX) 20 MG tablet TAKE 1 TABLET ONCE DAILY. 90 tablet 1  . levothyroxine (SYNTHROID, LEVOTHROID) 50 MCG tablet Take 50 mcg by mouth daily.    Marland Kitchen lovastatin (MEVACOR) 40 MG tablet Take 40 mg by mouth at bedtime.    . metoprolol tartrate (LOPRESSOR) 25 MG tablet TAKE 1 TABLET ONCE DAILY. 90 tablet 2  . Potassium Citrate-Citric Acid 3300-1002 MG PACK Take 1 packet by mouth daily.    . pramipexole (MIRAPEX) 1 MG tablet Take 1 tablet (1 mg total) by mouth 3 (three) times daily. 90 tablet 12  . PREMARIN vaginal cream Apply 1 application topically 2 (two) times a week.  10  . traMADol (ULTRAM) 50 MG tablet Take 50 mg by mouth every 6 (six) hours as needed for moderate pain.     . traMADol (ULTRAM-ER) 200 MG 24 hr tablet Take 200 mg by mouth daily.    Marland Kitchen trimethoprim (TRIMPEX) 100 MG tablet   2  . hydroxychloroquine (PLAQUENIL) 200 MG tablet  1   No facility-administered medications prior to visit.    PAST MEDICAL HISTORY: Past Medical History  Diagnosis Date  . Hypothyroidism   . Shortness of breath   . H/O hiatal hernia   . Arthritis   . Anxiety   . Neuropathy   . Tremor   . Complication of anesthesia     nigthmares, hallucinations   . Anemia   . Osteoporosis   . CHF (congestive heart failure)   . Parkinson disease   . Fibromyalgia   . Restless leg   . Sjogren's disease   . Hypertension     dr t turner  . GERD (gastroesophageal reflux disease)   . Hypercholesterolemia   . Diastolic dysfunction   . SOB (shortness of breath)     chronic due to diastolic dysfunction, deconditioning, obesity  . PVD (peripheral vascular disease)     99% stenosis of left anteiror tibial artery, mod stenosis of left distal SFA and popliteal artery followed by [name]  . Barrett esophagus   . Chronic diastolic CHF (congestive heart failure)   . Broken arm     right     PAST SURGICAL HISTORY: Past Surgical History  Procedure Laterality Date    . Abdominal hysterectomy    . Back surgery      3 back surgeries,   lumbar fusion  . Wrist fracture surgery    . Eye surgery      cateracts  . Falls      variious fall, broken wrist,and toes  . Total hip arthroplasty      right  . Cardiac catheterization  2006    normal  . Spine surgery  April 2013    Back X's 4  . Fracture surgery Right April 2016    Wrist, Pt. fell    FAMILY HISTORY: Family History  Problem Relation Age of Onset  . Heart attack Mother   . Restless legs syndrome Mother   . Heart failure Mother   . Heart disease Mother   . Hypertension Mother   . COPD Father     SOCIAL HISTORY:  History   Social History  . Marital Status: Married    Spouse Name: [name]  . Number of Children: 2  . Years of Education: HS   Occupational History  .      Homemaker   Social History Main Topics  . Smoking status: Never Smoker   . Smokeless tobacco: Never Used  . Alcohol Use: No  . Drug Use: No  . Sexual Activity: No   Other Topics Concern  . Not on file   Social History Narrative   Patient lives at home with her spouse.   Caffeine Use: tea     PHYSICAL EXAM  Filed Vitals:   07/06/15 1548  BP: 118/63  Pulse: 78  Height: 4\' 11"  (1.499 m)  Weight: 154 lb 3.2 oz (69.945 kg)    Not recorded      Body mass index is 31.13 kg/(m^2).  GENERAL EXAM: General: Patient is awake, alert and in no acute distress.  Well developed and groomed.  MASKED FACIES. Neck: Neck is supple. Cardiovascular: Heart is regular rate and rhythm with no murmurs.  Neurologic Exam  Mental Status: Awake, alert.  Language is fluent and comprehension intact. Cranial Nerves: Pupils are equal and reactive to light.  Visual fields are full to confrontation.  Conjugate eye movements are full and symmetric.  Facial sensation and strength are symmetric.  Hearing  is intact.  Palate elevated symmetrically and uvula is midline.  Shoulder shug is symmetric.  Tongue is midline. Motor:  Normal bulk and tone; MILD COGWHEELING IN LUE  WITH REINFORCEMENT.  NO REST OR POSTURAL TREMOR.  BRADYKINESIA IN BUE AND BLE (L SLOWER THAN R). Full strength in the upper and lower extremities; EXCEPT LIMITED IN RIGHT ARM DUE TO RIGHT SHOULDER PAIN Sensory: Intact and symmetric to light touch. Coordination: No ataxia or dysmetria on finger-nose or rapid alternating movement testing. Gait and Station: CAUTIOUS GAIT. SLOW TO RISE, AND NEEDS HELP TO STAND UP.  SLOW, DECR ARM SWING. SLOW TURNING. VERY UNSTEADY. Reflexes: Deep tendon reflexes in the upper extremity are present and symmetric; ABSENT AT KNEES AND ANKLES.   DIAGNOSTIC DATA (LABS, IMAGING, TESTING) - I reviewed patient records, labs, notes, testing and imaging myself where available.  Lab Results  Component Value Date   WBC 9.9 07/18/2012   HGB 9.8* 07/18/2012   HCT 29.6* 07/18/2012   MCV 88.1 07/18/2012   PLT 289 07/18/2012      Component Value Date/Time   NA 137 04/25/2015 0842   K 4.2 04/25/2015 0842   CL 104 04/25/2015 0842   CO2 27 04/25/2015 0842   GLUCOSE 109* 04/25/2015 0842   BUN 22 04/25/2015 0842   CREATININE 0.91 04/25/2015 0842   CALCIUM 9.5 04/25/2015 0842   PROT 6.5 05/22/2010 0923   ALBUMIN 3.9 05/22/2010 0923   AST 29 05/22/2010 0923   ALT 22 05/22/2010 0923   ALKPHOS 77 05/22/2010 0923   BILITOT 0.2* 05/22/2010 0923   GFRNONAA 81* 03/14/2012 0857   GFRAA >90 03/14/2012 0857   No results found for: CHOL No results found for: HGBA1C No results found for: VITAMINB12 No results found for: TSH   11/10/10 MRI brain - mild atrophy and minimal chronic small vessel ischemic disease.   ASSESSMENT AND PLAN  81 y.o. female with hypertension, hypercholesterolemia, hypothyroidism, fibromyalgia, restless leg syndrome and parkinson's disease.  More shoulder pain, memory loss and gait difficulty. Represents progression of parkinson's disease.  Dx: idiopathic parkinson's disease (akinetic/rigid) + RLS + memory  loss  PLAN: I spent 25 minutes of face to face time with patient. Greater than 50% of time was spent in counseling and coordination of care with patient. In summary we discussed:  1. Continue pramipexole 1mg  TID 2. Continue carb/levo to 2 tabs TID 3. Continue using rolling walker at all times; increase supervision and assistance to avoid falls/injuries 4. [facility])  Return in about 6 months (around 01/06/2016).    [name], MD 5/45/6256, 3:89 PM Certified in Neurology, Neurophysiology and Neuroimaging  [facility] [address], [place], [place] [address] [phone]

## 2015-07-12 ENCOUNTER — Other Ambulatory Visit: Payer: Self-pay | Admitting: Urology

## 2015-07-12 DIAGNOSIS — R3989 Other symptoms and signs involving the genitourinary system: Secondary | ICD-10-CM | POA: Diagnosis not present

## 2015-07-12 DIAGNOSIS — R3129 Other microscopic hematuria: Secondary | ICD-10-CM

## 2015-07-12 DIAGNOSIS — M25531 Pain in right wrist: Secondary | ICD-10-CM | POA: Diagnosis not present

## 2015-07-12 DIAGNOSIS — M6281 Muscle weakness (generalized): Secondary | ICD-10-CM | POA: Diagnosis not present

## 2015-07-12 DIAGNOSIS — R350 Frequency of micturition: Secondary | ICD-10-CM | POA: Diagnosis not present

## 2015-07-12 DIAGNOSIS — M79601 Pain in right arm: Secondary | ICD-10-CM | POA: Diagnosis not present

## 2015-07-12 DIAGNOSIS — R29898 Other symptoms and signs involving the musculoskeletal system: Secondary | ICD-10-CM | POA: Diagnosis not present

## 2015-07-12 DIAGNOSIS — N390 Urinary tract infection, site not specified: Secondary | ICD-10-CM

## 2015-07-12 DIAGNOSIS — R2681 Unsteadiness on feet: Secondary | ICD-10-CM | POA: Diagnosis not present

## 2015-07-12 DIAGNOSIS — G20 Parkinson's disease: Secondary | ICD-10-CM | POA: Diagnosis not present

## 2015-07-12 DIAGNOSIS — R3915 Urgency of urination: Secondary | ICD-10-CM | POA: Diagnosis not present

## 2015-07-12 DIAGNOSIS — N200 Calculus of kidney: Secondary | ICD-10-CM

## 2015-07-13 ENCOUNTER — Ambulatory Visit
Admission: RE | Admit: 2015-07-13 | Discharge: 2015-07-13 | Disposition: A | Discharge status: Home / Self Care | Payer: Medicare Other | Source: Ambulatory Visit | Attending: Urology | Admitting: Urology

## 2015-07-13 DIAGNOSIS — N200 Calculus of kidney: Secondary | ICD-10-CM

## 2015-07-13 DIAGNOSIS — M25531 Pain in right wrist: Secondary | ICD-10-CM | POA: Diagnosis not present

## 2015-07-13 DIAGNOSIS — R29898 Other symptoms and signs involving the musculoskeletal system: Secondary | ICD-10-CM | POA: Diagnosis not present

## 2015-07-13 DIAGNOSIS — G20 Parkinson's disease: Secondary | ICD-10-CM | POA: Diagnosis not present

## 2015-07-13 DIAGNOSIS — M6281 Muscle weakness (generalized): Secondary | ICD-10-CM | POA: Diagnosis not present

## 2015-07-13 DIAGNOSIS — R3129 Other microscopic hematuria: Secondary | ICD-10-CM

## 2015-07-13 DIAGNOSIS — M79601 Pain in right arm: Secondary | ICD-10-CM | POA: Diagnosis not present

## 2015-07-13 DIAGNOSIS — R2681 Unsteadiness on feet: Secondary | ICD-10-CM | POA: Diagnosis not present

## 2015-07-13 DIAGNOSIS — N390 Urinary tract infection, site not specified: Secondary | ICD-10-CM

## 2015-07-15 DIAGNOSIS — G20 Parkinson's disease: Secondary | ICD-10-CM | POA: Diagnosis not present

## 2015-07-15 DIAGNOSIS — R2681 Unsteadiness on feet: Secondary | ICD-10-CM | POA: Diagnosis not present

## 2015-07-15 DIAGNOSIS — M25531 Pain in right wrist: Secondary | ICD-10-CM | POA: Diagnosis not present

## 2015-07-15 DIAGNOSIS — M79601 Pain in right arm: Secondary | ICD-10-CM | POA: Diagnosis not present

## 2015-07-15 DIAGNOSIS — M6281 Muscle weakness (generalized): Secondary | ICD-10-CM | POA: Diagnosis not present

## 2015-07-15 DIAGNOSIS — R29898 Other symptoms and signs involving the musculoskeletal system: Secondary | ICD-10-CM | POA: Diagnosis not present

## 2015-07-19 DIAGNOSIS — M6281 Muscle weakness (generalized): Secondary | ICD-10-CM | POA: Diagnosis not present

## 2015-07-19 DIAGNOSIS — M79601 Pain in right arm: Secondary | ICD-10-CM | POA: Diagnosis not present

## 2015-07-19 DIAGNOSIS — R29898 Other symptoms and signs involving the musculoskeletal system: Secondary | ICD-10-CM | POA: Diagnosis not present

## 2015-07-19 DIAGNOSIS — M25531 Pain in right wrist: Secondary | ICD-10-CM | POA: Diagnosis not present

## 2015-07-19 DIAGNOSIS — R2681 Unsteadiness on feet: Secondary | ICD-10-CM | POA: Diagnosis not present

## 2015-07-19 DIAGNOSIS — G20 Parkinson's disease: Secondary | ICD-10-CM | POA: Diagnosis not present

## 2015-07-21 DIAGNOSIS — S52531D Colles' fracture of right radius, subsequent encounter for closed fracture with routine healing: Secondary | ICD-10-CM | POA: Diagnosis not present

## 2015-07-22 DIAGNOSIS — R2681 Unsteadiness on feet: Secondary | ICD-10-CM | POA: Diagnosis not present

## 2015-07-22 DIAGNOSIS — M79601 Pain in right arm: Secondary | ICD-10-CM | POA: Diagnosis not present

## 2015-07-22 DIAGNOSIS — R29898 Other symptoms and signs involving the musculoskeletal system: Secondary | ICD-10-CM | POA: Diagnosis not present

## 2015-07-22 DIAGNOSIS — M25531 Pain in right wrist: Secondary | ICD-10-CM | POA: Diagnosis not present

## 2015-07-22 DIAGNOSIS — M6281 Muscle weakness (generalized): Secondary | ICD-10-CM | POA: Diagnosis not present

## 2015-07-22 DIAGNOSIS — G20 Parkinson's disease: Secondary | ICD-10-CM | POA: Diagnosis not present

## 2015-07-25 DIAGNOSIS — M79601 Pain in right arm: Secondary | ICD-10-CM | POA: Diagnosis not present

## 2015-07-25 DIAGNOSIS — M25531 Pain in right wrist: Secondary | ICD-10-CM | POA: Diagnosis not present

## 2015-07-25 DIAGNOSIS — G20 Parkinson's disease: Secondary | ICD-10-CM | POA: Diagnosis not present

## 2015-07-25 DIAGNOSIS — M6281 Muscle weakness (generalized): Secondary | ICD-10-CM | POA: Diagnosis not present

## 2015-07-25 DIAGNOSIS — R29898 Other symptoms and signs involving the musculoskeletal system: Secondary | ICD-10-CM | POA: Diagnosis not present

## 2015-07-25 DIAGNOSIS — R2681 Unsteadiness on feet: Secondary | ICD-10-CM | POA: Diagnosis not present

## 2015-07-27 ENCOUNTER — Other Ambulatory Visit: Payer: Self-pay | Admitting: Cardiology

## 2015-07-28 DIAGNOSIS — G20 Parkinson's disease: Secondary | ICD-10-CM | POA: Diagnosis not present

## 2015-07-28 DIAGNOSIS — R2681 Unsteadiness on feet: Secondary | ICD-10-CM | POA: Diagnosis not present

## 2015-07-28 DIAGNOSIS — R29898 Other symptoms and signs involving the musculoskeletal system: Secondary | ICD-10-CM | POA: Diagnosis not present

## 2015-07-28 DIAGNOSIS — M6281 Muscle weakness (generalized): Secondary | ICD-10-CM | POA: Diagnosis not present

## 2015-07-28 DIAGNOSIS — M25531 Pain in right wrist: Secondary | ICD-10-CM | POA: Diagnosis not present

## 2015-07-28 DIAGNOSIS — M79601 Pain in right arm: Secondary | ICD-10-CM | POA: Diagnosis not present

## 2015-08-01 DIAGNOSIS — M961 Postlaminectomy syndrome, not elsewhere classified: Secondary | ICD-10-CM | POA: Diagnosis not present

## 2015-08-01 DIAGNOSIS — M47816 Spondylosis without myelopathy or radiculopathy, lumbar region: Secondary | ICD-10-CM | POA: Diagnosis not present

## 2015-08-01 DIAGNOSIS — M545 Low back pain: Secondary | ICD-10-CM | POA: Diagnosis not present

## 2015-08-04 DIAGNOSIS — R3915 Urgency of urination: Secondary | ICD-10-CM | POA: Diagnosis not present

## 2015-08-04 DIAGNOSIS — N390 Urinary tract infection, site not specified: Secondary | ICD-10-CM | POA: Diagnosis not present

## 2015-08-04 DIAGNOSIS — R312 Other microscopic hematuria: Secondary | ICD-10-CM | POA: Diagnosis not present

## 2015-08-04 DIAGNOSIS — R350 Frequency of micturition: Secondary | ICD-10-CM | POA: Diagnosis not present

## 2015-08-05 DIAGNOSIS — R2681 Unsteadiness on feet: Secondary | ICD-10-CM | POA: Diagnosis not present

## 2015-08-05 DIAGNOSIS — G20 Parkinson's disease: Secondary | ICD-10-CM | POA: Diagnosis not present

## 2015-08-05 DIAGNOSIS — R29898 Other symptoms and signs involving the musculoskeletal system: Secondary | ICD-10-CM | POA: Diagnosis not present

## 2015-08-05 DIAGNOSIS — M79601 Pain in right arm: Secondary | ICD-10-CM | POA: Diagnosis not present

## 2015-08-05 DIAGNOSIS — M25531 Pain in right wrist: Secondary | ICD-10-CM | POA: Diagnosis not present

## 2015-08-05 DIAGNOSIS — M6281 Muscle weakness (generalized): Secondary | ICD-10-CM | POA: Diagnosis not present

## 2015-08-09 DIAGNOSIS — G20 Parkinson's disease: Secondary | ICD-10-CM | POA: Diagnosis not present

## 2015-08-09 DIAGNOSIS — M79601 Pain in right arm: Secondary | ICD-10-CM | POA: Diagnosis not present

## 2015-08-09 DIAGNOSIS — R29898 Other symptoms and signs involving the musculoskeletal system: Secondary | ICD-10-CM | POA: Diagnosis not present

## 2015-08-09 DIAGNOSIS — M25531 Pain in right wrist: Secondary | ICD-10-CM | POA: Diagnosis not present

## 2015-08-09 DIAGNOSIS — M6281 Muscle weakness (generalized): Secondary | ICD-10-CM | POA: Diagnosis not present

## 2015-08-09 DIAGNOSIS — R2681 Unsteadiness on feet: Secondary | ICD-10-CM | POA: Diagnosis not present

## 2015-08-11 DIAGNOSIS — M79642 Pain in left hand: Secondary | ICD-10-CM | POA: Diagnosis not present

## 2015-08-11 DIAGNOSIS — M79641 Pain in right hand: Secondary | ICD-10-CM | POA: Diagnosis not present

## 2015-08-11 DIAGNOSIS — Z79899 Other long term (current) drug therapy: Secondary | ICD-10-CM | POA: Diagnosis not present

## 2015-08-11 DIAGNOSIS — M3500 Sicca syndrome, unspecified: Secondary | ICD-10-CM | POA: Diagnosis not present

## 2015-08-11 DIAGNOSIS — M154 Erosive (osteo)arthritis: Secondary | ICD-10-CM | POA: Diagnosis not present

## 2015-08-11 DIAGNOSIS — M545 Low back pain: Secondary | ICD-10-CM | POA: Diagnosis not present

## 2015-08-12 DIAGNOSIS — R29898 Other symptoms and signs involving the musculoskeletal system: Secondary | ICD-10-CM | POA: Diagnosis not present

## 2015-08-12 DIAGNOSIS — R2681 Unsteadiness on feet: Secondary | ICD-10-CM | POA: Diagnosis not present

## 2015-08-12 DIAGNOSIS — G20 Parkinson's disease: Secondary | ICD-10-CM | POA: Diagnosis not present

## 2015-08-12 DIAGNOSIS — M25531 Pain in right wrist: Secondary | ICD-10-CM | POA: Diagnosis not present

## 2015-08-12 DIAGNOSIS — M79601 Pain in right arm: Secondary | ICD-10-CM | POA: Diagnosis not present

## 2015-08-12 DIAGNOSIS — M6281 Muscle weakness (generalized): Secondary | ICD-10-CM | POA: Diagnosis not present

## 2015-08-15 DIAGNOSIS — M25531 Pain in right wrist: Secondary | ICD-10-CM | POA: Diagnosis not present

## 2015-08-15 DIAGNOSIS — M79601 Pain in right arm: Secondary | ICD-10-CM | POA: Diagnosis not present

## 2015-08-15 DIAGNOSIS — R29898 Other symptoms and signs involving the musculoskeletal system: Secondary | ICD-10-CM | POA: Diagnosis not present

## 2015-08-15 DIAGNOSIS — R2681 Unsteadiness on feet: Secondary | ICD-10-CM | POA: Diagnosis not present

## 2015-08-15 DIAGNOSIS — M6281 Muscle weakness (generalized): Secondary | ICD-10-CM | POA: Diagnosis not present

## 2015-08-15 DIAGNOSIS — G20 Parkinson's disease: Secondary | ICD-10-CM | POA: Diagnosis not present

## 2015-08-16 DIAGNOSIS — M25531 Pain in right wrist: Secondary | ICD-10-CM | POA: Diagnosis not present

## 2015-08-16 DIAGNOSIS — R2681 Unsteadiness on feet: Secondary | ICD-10-CM | POA: Diagnosis not present

## 2015-08-16 DIAGNOSIS — R29898 Other symptoms and signs involving the musculoskeletal system: Secondary | ICD-10-CM | POA: Diagnosis not present

## 2015-08-16 DIAGNOSIS — M6281 Muscle weakness (generalized): Secondary | ICD-10-CM | POA: Diagnosis not present

## 2015-08-16 DIAGNOSIS — G20 Parkinson's disease: Secondary | ICD-10-CM | POA: Diagnosis not present

## 2015-08-16 DIAGNOSIS — M79601 Pain in right arm: Secondary | ICD-10-CM | POA: Diagnosis not present

## 2015-08-17 ENCOUNTER — Other Ambulatory Visit: Payer: Self-pay | Admitting: Anesthesiology

## 2015-08-17 DIAGNOSIS — D649 Anemia, unspecified: Secondary | ICD-10-CM | POA: Diagnosis not present

## 2015-08-19 ENCOUNTER — Ambulatory Visit: Payer: BLUE CROSS/BLUE SHIELD | Admitting: Diagnostic Neuroimaging

## 2015-08-19 DIAGNOSIS — M6281 Muscle weakness (generalized): Secondary | ICD-10-CM | POA: Diagnosis not present

## 2015-08-19 DIAGNOSIS — Z1231 Encounter for screening mammogram for malignant neoplasm of breast: Secondary | ICD-10-CM | POA: Diagnosis not present

## 2015-08-19 DIAGNOSIS — M25531 Pain in right wrist: Secondary | ICD-10-CM | POA: Diagnosis not present

## 2015-08-19 DIAGNOSIS — R29898 Other symptoms and signs involving the musculoskeletal system: Secondary | ICD-10-CM | POA: Diagnosis not present

## 2015-08-19 DIAGNOSIS — M79601 Pain in right arm: Secondary | ICD-10-CM | POA: Diagnosis not present

## 2015-08-19 DIAGNOSIS — R2681 Unsteadiness on feet: Secondary | ICD-10-CM | POA: Diagnosis not present

## 2015-08-19 DIAGNOSIS — G20 Parkinson's disease: Secondary | ICD-10-CM | POA: Diagnosis not present

## 2015-08-19 DIAGNOSIS — M810 Age-related osteoporosis without current pathological fracture: Secondary | ICD-10-CM | POA: Diagnosis not present

## 2015-08-30 DIAGNOSIS — G20 Parkinson's disease: Secondary | ICD-10-CM | POA: Diagnosis not present

## 2015-08-30 DIAGNOSIS — R2681 Unsteadiness on feet: Secondary | ICD-10-CM | POA: Diagnosis not present

## 2015-08-30 DIAGNOSIS — M79601 Pain in right arm: Secondary | ICD-10-CM | POA: Diagnosis not present

## 2015-08-30 DIAGNOSIS — M25531 Pain in right wrist: Secondary | ICD-10-CM | POA: Diagnosis not present

## 2015-08-30 DIAGNOSIS — M6281 Muscle weakness (generalized): Secondary | ICD-10-CM | POA: Diagnosis not present

## 2015-08-30 DIAGNOSIS — R29898 Other symptoms and signs involving the musculoskeletal system: Secondary | ICD-10-CM | POA: Diagnosis not present

## 2015-09-01 ENCOUNTER — Encounter (HOSPITAL_COMMUNITY): Payer: Self-pay | Admitting: Emergency Medicine

## 2015-09-01 ENCOUNTER — Emergency Department (HOSPITAL_COMMUNITY)
Admission: EM | Admit: 2015-09-01 | Discharge: 2015-09-01 | Disposition: A | Discharge status: Home / Self Care | Payer: Medicare Other | Attending: Emergency Medicine | Admitting: Emergency Medicine

## 2015-09-01 ENCOUNTER — Emergency Department (HOSPITAL_COMMUNITY): Payer: Medicare Other

## 2015-09-01 DIAGNOSIS — Z7982 Long term (current) use of aspirin: Secondary | ICD-10-CM | POA: Diagnosis not present

## 2015-09-01 DIAGNOSIS — E039 Hypothyroidism, unspecified: Secondary | ICD-10-CM | POA: Diagnosis not present

## 2015-09-01 DIAGNOSIS — I10 Essential (primary) hypertension: Secondary | ICD-10-CM | POA: Diagnosis not present

## 2015-09-01 DIAGNOSIS — Z91040 Latex allergy status: Secondary | ICD-10-CM | POA: Diagnosis not present

## 2015-09-01 DIAGNOSIS — R011 Cardiac murmur, unspecified: Secondary | ICD-10-CM | POA: Insufficient documentation

## 2015-09-01 DIAGNOSIS — S92351A Displaced fracture of fifth metatarsal bone, right foot, initial encounter for closed fracture: Secondary | ICD-10-CM | POA: Insufficient documentation

## 2015-09-01 DIAGNOSIS — Y9389 Activity, other specified: Secondary | ICD-10-CM | POA: Diagnosis not present

## 2015-09-01 DIAGNOSIS — M1990 Unspecified osteoarthritis, unspecified site: Secondary | ICD-10-CM | POA: Insufficient documentation

## 2015-09-01 DIAGNOSIS — I739 Peripheral vascular disease, unspecified: Secondary | ICD-10-CM | POA: Insufficient documentation

## 2015-09-01 DIAGNOSIS — Z79899 Other long term (current) drug therapy: Secondary | ICD-10-CM | POA: Insufficient documentation

## 2015-09-01 DIAGNOSIS — R0781 Pleurodynia: Secondary | ICD-10-CM

## 2015-09-01 DIAGNOSIS — Z862 Personal history of diseases of the blood and blood-forming organs and certain disorders involving the immune mechanism: Secondary | ICD-10-CM | POA: Insufficient documentation

## 2015-09-01 DIAGNOSIS — K219 Gastro-esophageal reflux disease without esophagitis: Secondary | ICD-10-CM | POA: Insufficient documentation

## 2015-09-01 DIAGNOSIS — G629 Polyneuropathy, unspecified: Secondary | ICD-10-CM | POA: Diagnosis not present

## 2015-09-01 DIAGNOSIS — S62521A Displaced fracture of distal phalanx of right thumb, initial encounter for closed fracture: Secondary | ICD-10-CM | POA: Diagnosis not present

## 2015-09-01 DIAGNOSIS — S299XXA Unspecified injury of thorax, initial encounter: Secondary | ICD-10-CM | POA: Diagnosis not present

## 2015-09-01 DIAGNOSIS — S29001A Unspecified injury of muscle and tendon of front wall of thorax, initial encounter: Secondary | ICD-10-CM | POA: Diagnosis not present

## 2015-09-01 DIAGNOSIS — Y9289 Other specified places as the place of occurrence of the external cause: Secondary | ICD-10-CM | POA: Insufficient documentation

## 2015-09-01 DIAGNOSIS — F419 Anxiety disorder, unspecified: Secondary | ICD-10-CM | POA: Diagnosis not present

## 2015-09-01 DIAGNOSIS — Z23 Encounter for immunization: Secondary | ICD-10-CM | POA: Diagnosis not present

## 2015-09-01 DIAGNOSIS — I5032 Chronic diastolic (congestive) heart failure: Secondary | ICD-10-CM | POA: Diagnosis not present

## 2015-09-01 DIAGNOSIS — G20 Parkinson's disease: Secondary | ICD-10-CM | POA: Insufficient documentation

## 2015-09-01 DIAGNOSIS — S92301A Fracture of unspecified metatarsal bone(s), right foot, initial encounter for closed fracture: Secondary | ICD-10-CM

## 2015-09-01 DIAGNOSIS — W010XXA Fall on same level from slipping, tripping and stumbling without subsequent striking against object, initial encounter: Secondary | ICD-10-CM | POA: Insufficient documentation

## 2015-09-01 DIAGNOSIS — S62524A Nondisplaced fracture of distal phalanx of right thumb, initial encounter for closed fracture: Secondary | ICD-10-CM | POA: Diagnosis not present

## 2015-09-01 DIAGNOSIS — Y998 Other external cause status: Secondary | ICD-10-CM | POA: Diagnosis not present

## 2015-09-01 DIAGNOSIS — S6991XA Unspecified injury of right wrist, hand and finger(s), initial encounter: Secondary | ICD-10-CM | POA: Diagnosis present

## 2015-09-01 DIAGNOSIS — S62501A Fracture of unspecified phalanx of right thumb, initial encounter for closed fracture: Secondary | ICD-10-CM

## 2015-09-01 DIAGNOSIS — W19XXXA Unspecified fall, initial encounter: Secondary | ICD-10-CM

## 2015-09-01 MED ORDER — CEPHALEXIN 500 MG PO CAPS: 500.0000 mg | ORAL_CAPSULE | Freq: Two times a day (BID) | ORAL | Status: DC

## 2015-09-01 MED ORDER — TRAMADOL HCL 50 MG PO TABS
50.0000 mg | ORAL_TABLET | Freq: Once | ORAL | Status: AC
  Administered 2015-09-01: 50 mg via ORAL
  Filled 2015-09-01: qty 1

## 2015-09-01 MED ORDER — CEFAZOLIN SODIUM 1-5 GM-% IV SOLN
1.0000 g | Freq: Once | INTRAVENOUS | Status: DC
  Filled 2015-09-01: qty 50

## 2015-09-01 MED ORDER — CEPHALEXIN 500 MG PO CAPS
500.0000 mg | ORAL_CAPSULE | Freq: Once | ORAL | Status: AC
  Administered 2015-09-01: 500 mg via ORAL
  Filled 2015-09-01: qty 1

## 2015-09-01 MED ORDER — TRAMADOL HCL 50 MG PO TABS: 50.0000 mg | ORAL_TABLET | Freq: Four times a day (QID) | ORAL | Status: DC | PRN

## 2015-09-01 MED ORDER — TETANUS-DIPHTH-ACELL PERTUSSIS 5-2.5-18.5 LF-MCG/0.5 IM SUSP
0.5000 mL | Freq: Once | INTRAMUSCULAR | Status: AC
  Administered 2015-09-01: 0.5 mL via INTRAMUSCULAR
  Filled 2015-09-01: qty 0.5

## 2015-09-01 NOTE — ED Notes (Signed)
Patient transported to X-ray 

## 2015-09-01 NOTE — Progress Notes (Signed)
CSW met with patient at bedside. Husband and daughter were present. Patient confirms that she is from [facility]. Also, patient confirms that she fell today. Patient stated " My food sticks, and I think that's what happened." Patient informed CSW that she has parkinson's. She states that she and her husband are in the independent living unit. Patient says that she feels she is at the appropriate level of care. Patient states that prior to coming to [facility] she has been able to complete ADL's independently.   Patient informed CSW that she has not had any falls within the past 6 months. However, patient and daughter informed CSW that she had surgery in April on R arm.   Daughter states that she is the primary support for patient, and that the patient has good support within other family members.  Patient and family state that they do not have any questions at this time.  Daughter/ Katherine [phone] Husband/Patrick [phone]  Willette Brace [phone] ED CSW 09/01/2015 5:39 PM

## 2015-09-01 NOTE — ED Notes (Signed)
Per pt, states she tripped and fell this am-states left breast, right thumb, right buttock and right foot pain-no LOC

## 2015-09-01 NOTE — ED Provider Notes (Signed)
Patient care assumed from Delsa Grana at shift change. Please see her note for further.  Patient presented after she tripped and fell this morning. Patient is complaining of right foot and right thumb pain. At the time of shift change the patient is awaiting callback from orthopedic hand surgeon Dr. Fredna Dow.  The patient had fractures of her right foot and right thumb. These were splinted and she was advised by Kristeen Miss to follow up with ortho for her foot. I updated the patient's Tdap in the ED. She was unsure when her last tdap was.   I spoke with Dr. Fredna Dow from hand surgery who would like the patient's thumb splinted and have her follow up in his office tomorrow. He would also like her on antibiotics. I advised the patient of this plan and she agrees. Will send home with rx for Keflex and have her follow up with ortho and hand surgery. I advised the patient to follow-up with their primary care provider this week. I advised the patient to return to the emergency department with new or worsening symptoms or new concerns. The patient verbalized understanding and agreement with plan.     Results for orders placed or performed in visit on 82/50/53  Basic Metabolic Panel (BMET)  Result Value Ref Range   Sodium 137 135 - 145 mEq/L   Potassium 4.2 3.5 - 5.1 mEq/L   Chloride 104 96 - 112 mEq/L   CO2 27 19 - 32 mEq/L   Glucose, Bld 109 (H) 70 - 99 mg/dL   BUN 22 6 - 23 mg/dL   Creatinine, Ser 0.91 0.40 - 1.20 mg/dL   Calcium 9.5 8.4 - 10.5 mg/dL   GFR 62.97 >60.00 mL/min   Dg Ribs Unilateral W/chest Left  09/01/2015   CLINICAL DATA:  81 year old who tripped and fell while at home earlier today, injuring the left chest. Initial encounter.  EXAM: LEFT RIBS AND CHEST - 3+ VIEW  COMPARISON:  No prior rib imaging. Chest x-ray 03/24/2012 and earlier. CTA chest 10/05/2010.  FINDINGS: No evidence of acute fracture involving the left ribs. Mild osseous demineralization. Costal cartilage calcification.  Suboptimal  inspiration accounts for crowded bronchovascular markings, especially in the bases, and accentuates the cardiac silhouette. Taking this into account, cardiac silhouette mildly enlarged, unchanged. Small hiatal hernia, unchanged. Thoracic aorta mildly tortuous and atherosclerotic, unchanged. Minimal linear atelectasis at the right lung base. Lungs otherwise clear. No pneumothorax. No pleural effusions.  IMPRESSION: 1. No left rib fractures identified. 2. Suboptimal inspiration accounts for mild right basilar atelectasis. No acute cardiopulmonary disease.   Electronically Signed   By: Evangeline Dakin M.D.   On: 09/01/2015 16:55   Dg Finger Thumb Right  09/01/2015   CLINICAL DATA:  Status post fall this morning with right thumb pain.  EXAM: RIGHT THUMB 2+V  COMPARISON:  None.  FINDINGS: There is comminuted nondisplaced fracture of the proximal aspect of the first distal phalanx. There is lucency identified in the distal aspect of the first proximal phalanx, nondisplaced fracture is not excluded.  IMPRESSION: Fracture at the base of the first distal phalanx. Lucency identified in the distal aspect of the first proximal phalanx, nondisplaced fracture is not excluded.   Electronically Signed   By: Abelardo Diesel M.D.   On: 09/01/2015 16:58   Dg Foot Complete Right  09/01/2015   CLINICAL DATA:  Acute right foot pain following fall today. Initial encounter.  EXAM: RIGHT FOOT COMPLETE - 3+ VIEW  COMPARISON:  None.  FINDINGS: An  oblique fracture of the distal metatarsal is noted with 2 mm medial displacement.  No other fractures are identified.  There is no evidence of subluxation or dislocation.  Vascular calcifications are present.  The Lisfranc joints are unremarkable.  IMPRESSION: Minimally displaced oblique fracture of the distal fifth metatarsal.   Electronically Signed   By: Margarette Canada M.D.   On: 09/01/2015 16:54      Waynetta Pean, PA-C 09/01/15 1932

## 2015-09-01 NOTE — ED Notes (Signed)
Warm blanket given to pt

## 2015-09-01 NOTE — Discharge Instructions (Signed)
Finger Fracture Fractures of fingers are breaks in the bones of the fingers. There are many types of fractures. There are different ways of treating these fractures. Your health care provider will discuss the best way to treat your fracture. CAUSES Traumatic injury is the main cause of broken fingers. These include:  Injuries while playing sports.  Workplace injuries.  Falls. RISK FACTORS Activities that can increase your risk of finger fractures include:  Sports.  Workplace activities that involve machinery.  A condition called osteoporosis, which can make your bones less dense and cause them to fracture more easily. SIGNS AND SYMPTOMS The main symptoms of a broken finger are pain and swelling within 15 minutes after the injury. Other symptoms include:  Bruising of your finger.  Stiffness of your finger.  Numbness of your finger.  Exposed bones (compound fracture) if the fracture is severe. DIAGNOSIS  The best way to diagnose a broken bone is with X-ray imaging. Additionally, your health care provider will use this X-ray image to evaluate the position of the broken finger bones.  TREATMENT  Finger fractures can be treated with:   Nonreduction--This means the bones are in place. The finger is splinted without changing the positions of the bone pieces. The splint is usually left on for about a week to 10 days. This will depend on your fracture and what your health care provider thinks.  Closed reduction--The bones are put back into position without using surgery. The finger is then splinted.  Open reduction and internal fixation--The fracture site is opened. Then the bone pieces are fixed into place with pins or some type of hardware. This is seldom required. It depends on the severity of the fracture. HOME CARE INSTRUCTIONS   Follow your health care provider's instructions regarding activities, exercises, and physical therapy.  Only take over-the-counter or prescription  medicines for pain, discomfort, or fever as directed by your health care provider. SEEK MEDICAL CARE IF: You have pain or swelling that limits the motion or use of your fingers. SEEK IMMEDIATE MEDICAL CARE IF:  Your finger becomes numb. MAKE SURE YOU:   Understand these instructions.  Will watch your condition.  Will get help right away if you are not doing well or get worse. Document Released: 03/10/2001 Document Revised: 09/16/2013 Document Reviewed: 07/08/2013 Exit[employer] Patient Information 2015 [employer]. This information is not intended to replace advice given to you by your health care provider. Make sure you discuss any questions you have with your health care provider.  

## 2015-09-01 NOTE — ED Provider Notes (Signed)
CSN: [id number]     Arrival date & time 09/01/15  1320 History   First MD Initiated Contact with Patient 09/01/15 1435     Chief Complaint  Patient presents with  . Fall     (Consider location/radiation/quality/duration/timing/severity/associated sxs/prior Treatment) HPI   The patient is a 81 year old female, with a history of Parkinson's, who presented to the emergency department after tripping and falling at approximately 8:30 AM this morning. She struck her right thumb with a small laceration and some bleeding just below her nail, she reports pain, bruising and swelling on the outside of her right foot.  She also hit her left breast into the coffee table and has anterior rib and chest tenderness which is worse with deep inspiration or with palpation.  The patient did not strike her head and did not lose consciousness. She was stuck laying on her stomach and called for help from her husband who is able to assist her.  She denies any shortness of breath, syncope, nausea or vomiting. She does not have any weakness, numbness or tingling. She has no other acute complaints at this time.   Past Medical History  Diagnosis Date  . Hypothyroidism   . Shortness of breath   . H/O hiatal hernia   . Arthritis   . Anxiety   . Neuropathy   . Tremor   . Complication of anesthesia     nigthmares, hallucinations   . Anemia   . Osteoporosis   . CHF (congestive heart failure)   . Parkinson disease   . Fibromyalgia   . Restless leg   . Sjogren's disease   . Hypertension     dr t turner  . GERD (gastroesophageal reflux disease)   . Hypercholesterolemia   . Diastolic dysfunction   . SOB (shortness of breath)     chronic due to diastolic dysfunction, deconditioning, obesity  . PVD (peripheral vascular disease)     99% stenosis of left anteiror tibial artery, mod stenosis of left distal SFA and popliteal artery followed by [name]  . Barrett esophagus   . Chronic diastolic CHF (congestive  heart failure)   . Broken arm     right    Past Surgical History  Procedure Laterality Date  . Abdominal hysterectomy    . Back surgery      3 back surgeries,   lumbar fusion  . Wrist fracture surgery    . Eye surgery      cateracts  . Falls      variious fall, broken wrist,and toes  . Total hip arthroplasty      right  . Cardiac catheterization  2006    normal  . Spine surgery  April 2013    Back X's 4  . Fracture surgery Right April 2016    Wrist, Pt. fell   Family History  Problem Relation Age of Onset  . Heart attack Mother   . Restless legs syndrome Mother   . Heart failure Mother   . Heart disease Mother   . Hypertension Mother   . COPD Father    Social History  Substance Use Topics  . Smoking status: Never Smoker   . Smokeless tobacco: Never Used  . Alcohol Use: No   OB History    No data available     Review of Systems 10 Systems reviewed and are negative for acute change except as noted in the HPI.      Allergies  Codeine; Lactose intolerance (gi); Latex;  Lyrica; Other; Plaquenil; Reglan; Requip; Septra; and Shellfish allergy  Home Medications   Prior to Admission medications   Medication Sig Start Date End Date Taking? Authorizing Provider  albuterol (PROAIR HFA) 108 (90 BASE) MCG/ACT inhaler Inhale 2 puffs into the lungs every 4 (four) hours as needed for wheezing or shortness of breath.   Yes Historical Provider, MD  aspirin 81 MG tablet Take 81 mg by mouth daily.   Yes Historical Provider, MD  carbidopa-levodopa (SINEMET IR) 25-100 MG per tablet Take 2 tablets by mouth 3 (three) times daily. 03/02/15  Yes Vikram R Penumalli, MD  CELEBREX 200 MG capsule Take 200 mg by mouth 2 (two) times daily.  11/03/13  Yes Historical Provider, MD  Cholecalciferol (VITAMIN D3) 2000 UNITS TABS Take 2,000 Units by mouth daily.    Yes Historical Provider, MD  Coenzyme Q10 100 MG TABS Take 100 mg by mouth daily.   Yes Historical Provider, MD  denosumab (PROLIA) 60  MG/ML SOLN injection Inject 60 mg into the skin every 6 (six) months. Administer in upper arm, thigh, or abdomen   Yes Historical Provider, MD  dexlansoprazole (DEXILANT) 60 MG capsule Take 60 mg by mouth daily.   Yes Historical Provider, MD  diltiazem (CARDIZEM CD) 120 MG 24 hr capsule Take 120 mg by mouth daily.   Yes Historical Provider, MD  diphenhydramine-acetaminophen (TYLENOL PM) 25-500 MG TABS Take 1 tablet by mouth at bedtime.   Yes Historical Provider, MD  DULoxetine (CYMBALTA) 20 MG capsule Take 20 mg by mouth daily.  06/27/15  Yes Historical Provider, MD  ezetimibe (ZETIA) 10 MG tablet Take 10 mg by mouth every evening.   Yes Historical Provider, MD  furosemide (LASIX) 20 MG tablet Take 20 mg by mouth daily.   Yes Historical Provider, MD  hydroxychloroquine (PLAQUENIL) 200 MG tablet Take 200 mg by mouth daily. 08/11/15  Yes Historical Provider, MD  hypromellose (SYSTANE OVERNIGHT THERAPY) 0.3 % GEL ophthalmic ointment Place 1 application into both eyes at bedtime.   Yes Historical Provider, MD  levothyroxine (SYNTHROID, LEVOTHROID) 50 MCG tablet Take 50 mcg by mouth daily.   Yes Historical Provider, MD  lovastatin (MEVACOR) 40 MG tablet Take 40 mg by mouth at bedtime.   Yes Historical Provider, MD  metoprolol tartrate (LOPRESSOR) 25 MG tablet Take 25 mg by mouth daily.   Yes Historical Provider, MD  nortriptyline (PAMELOR) 25 MG capsule Take 25 mg by mouth at bedtime.  08/04/15  Yes Historical Provider, MD  Polyethyl Glycol-Propyl Glycol (SYSTANE) 0.4-0.3 % GEL ophthalmic gel Place 1 application into both eyes every other day.   Yes Historical Provider, MD  Polyethyl Glycol-Propyl Glycol (SYSTANE) 0.4-0.3 % SOLN Place 1 drop into both eyes every other day.   Yes Historical Provider, MD  Potassium Citrate-Citric Acid 3300-1002 MG PACK Take 1 packet by mouth daily.   Yes Historical Provider, MD  pramipexole (MIRAPEX) 1 MG tablet Take 1 tablet (1 mg total) by mouth 3 (three) times  daily. Patient taking differently: Take 1 mg by mouth 2 (two) times daily.  04/08/15  Yes [name], MD  PREMARIN vaginal cream Apply 1 application topically 2 (two) times a week. 04/07/15  Yes Historical Provider, MD  Skin Protectants, Misc. (EUCERIN) cream Apply 1 application topically as needed (Applies under breast and arm for skin irritation.).   Yes Historical Provider, MD  traMADol (ULTRAM) 50 MG tablet Take 50 mg by mouth every 6 (six) hours as needed for moderate pain.  Yes Historical Provider, MD  traMADol (ULTRAM-ER) 200 MG 24 hr tablet Take 200 mg by mouth daily.   Yes Historical Provider, MD  trimethoprim (TRIMPEX) 100 MG tablet Take 100 mg by mouth daily.  04/30/15  Yes Historical Provider, MD  VESICARE 10 MG tablet Take 10 mg by mouth daily. 08/30/15  Yes Historical Provider, MD   BP 118/54 mmHg  Pulse 72  Temp(Src) 97.8 F (36.6 C) (Oral)  Resp 18  SpO2 97% Physical Exam  Constitutional: She is oriented to person, place, and time. She appears well-developed and well-nourished. No distress.  HENT:  Head: Normocephalic and atraumatic.  Nose: Nose normal.  Mouth/Throat: Oropharynx is clear and moist. No oropharyngeal exudate.  Eyes: Conjunctivae and EOM are normal. Pupils are equal, round, and reactive to light. Right eye exhibits no discharge. Left eye exhibits no discharge. No scleral icterus.  Neck: Normal range of motion. No JVD present. No tracheal deviation present. No thyromegaly present.  Cardiovascular: Normal rate, regular rhythm, normal heart sounds and intact distal pulses.  Exam reveals no gallop and no friction rub.   No murmur heard. Pulmonary/Chest: Effort normal and breath sounds normal. No respiratory distress. She has no wheezes. She has no rales. She exhibits tenderness and bony tenderness.  Tenderness to palpation over left breast, left anterior and lateral ribs, tenderness to palpation of sternum and to the right anterior chest, poor inspiratory  effort, sounds decreased bilaterally bases, no Rales, no wheeze no rhonchi  Abdominal: Soft. Bowel sounds are normal. She exhibits no distension and no mass. There is no tenderness. There is no rebound and no guarding.  Musculoskeletal: Normal range of motion. She exhibits no edema.       Right ankle: No tenderness. No lateral malleolus and no medial malleolus tenderness found.       Hands:      Right foot: There is tenderness, bony tenderness and swelling. There is normal capillary refill, no crepitus, no deformity and no laceration.       Feet:  Lymphadenopathy:    She has no cervical adenopathy.  Neurological: She is alert and oriented to person, place, and time. She has normal reflexes. No cranial nerve deficit.  Skin: Skin is warm and dry. Bruising noted. No rash noted. She is not diaphoretic. No cyanosis or erythema. No pallor. Nails show no clubbing.  Right thumb, linear laceration below the proximal nail for  Psychiatric: She has a normal mood and affect. Her behavior is normal. Judgment and thought content normal.  Nursing note and vitals reviewed.     ED Course  Procedures (including critical care time) Labs Review Labs Reviewed - No data to display  Imaging Review Dg Ribs Unilateral W/chest Left  09/01/2015   CLINICAL DATA:  81 year old who tripped and fell while at home earlier today, injuring the left chest. Initial encounter.  EXAM: LEFT RIBS AND CHEST - 3+ VIEW  COMPARISON:  No prior rib imaging. Chest x-ray 03/24/2012 and earlier. CTA chest 10/05/2010.  FINDINGS: No evidence of acute fracture involving the left ribs. Mild osseous demineralization. Costal cartilage calcification.  Suboptimal inspiration accounts for crowded bronchovascular markings, especially in the bases, and accentuates the cardiac silhouette. Taking this into account, cardiac silhouette mildly enlarged, unchanged. Small hiatal hernia, unchanged. Thoracic aorta mildly tortuous and atherosclerotic,  unchanged. Minimal linear atelectasis at the right lung base. Lungs otherwise clear. No pneumothorax. No pleural effusions.  IMPRESSION: 1. No left rib fractures identified. 2. Suboptimal inspiration accounts for mild right basilar  atelectasis. No acute cardiopulmonary disease.   Electronically Signed   By: Evangeline Dakin M.D.   On: 09/01/2015 16:55   Dg Finger Thumb Right  09/01/2015   CLINICAL DATA:  Status post fall this morning with right thumb pain.  EXAM: RIGHT THUMB 2+V  COMPARISON:  None.  FINDINGS: There is comminuted nondisplaced fracture of the proximal aspect of the first distal phalanx. There is lucency identified in the distal aspect of the first proximal phalanx, nondisplaced fracture is not excluded.  IMPRESSION: Fracture at the base of the first distal phalanx. Lucency identified in the distal aspect of the first proximal phalanx, nondisplaced fracture is not excluded.   Electronically Signed   By: Abelardo Diesel M.D.   On: 09/01/2015 16:58   Dg Foot Complete Right  09/01/2015   CLINICAL DATA:  Acute right foot pain following fall today. Initial encounter.  EXAM: RIGHT FOOT COMPLETE - 3+ VIEW  COMPARISON:  None.  FINDINGS: An oblique fracture of the distal metatarsal is noted with 2 mm medial displacement.  No other fractures are identified.  There is no evidence of subluxation or dislocation.  Vascular calcifications are present.  The Lisfranc joints are unremarkable.  IMPRESSION: Minimally displaced oblique fracture of the distal fifth metatarsal.   Electronically Signed   By: Margarette Canada M.D.   On: 09/01/2015 16:54   I have personally reviewed and evaluated these images and lab results as part of my medical decision-making.   EKG Interpretation None      MDM   Final diagnoses:  None    Patient with a mechanical fall and multiple injuries. The right thumb has a laceration versus contusion, swelling and ecchymosis. No obvious deformity, x-ray reveals a comminuted nondisplaced  fracture of the proximal aspect of first distal phalanx.  Wound was dressed and thumb spica applied The patient's right foot had some swelling and ecchymosis as well. Films pertinent for a oblique fracture of the distal fifth metatarsal. Patient was in a cam walker with padding. Patient was complaining of chest tenderness, x-ray did not reveal any concerning pulmonary pathology or rib fracture  I also called placed to Dr. Sandi Raveling for hand and to orthopedics to establish follow-up at the urging of the patient and her family.  Patient was handed off to Will Dansie at shift change to assist with further arrangement of follow-up with orthopedic.    Delsa Grana, PA-C 09/09/15 7209  Varney Biles, MD 09/12/15 [phone]

## 2015-09-02 ENCOUNTER — Encounter (HOSPITAL_COMMUNITY): Payer: Self-pay

## 2015-09-02 ENCOUNTER — Encounter (HOSPITAL_COMMUNITY)
Admission: RE | Admit: 2015-09-02 | Discharge: 2015-09-02 | Disposition: A | Discharge status: Home / Self Care | Payer: Medicare Other | Source: Ambulatory Visit | Attending: Anesthesiology | Admitting: Anesthesiology

## 2015-09-02 DIAGNOSIS — M19011 Primary osteoarthritis, right shoulder: Secondary | ICD-10-CM | POA: Diagnosis not present

## 2015-09-02 DIAGNOSIS — G20 Parkinson's disease: Secondary | ICD-10-CM | POA: Diagnosis not present

## 2015-09-02 DIAGNOSIS — S61111A Laceration without foreign body of right thumb with damage to nail, initial encounter: Secondary | ICD-10-CM | POA: Diagnosis not present

## 2015-09-02 DIAGNOSIS — M3500 Sicca syndrome, unspecified: Secondary | ICD-10-CM | POA: Insufficient documentation

## 2015-09-02 DIAGNOSIS — S62524A Nondisplaced fracture of distal phalanx of right thumb, initial encounter for closed fracture: Secondary | ICD-10-CM | POA: Diagnosis not present

## 2015-09-02 DIAGNOSIS — I509 Heart failure, unspecified: Secondary | ICD-10-CM | POA: Insufficient documentation

## 2015-09-02 DIAGNOSIS — E785 Hyperlipidemia, unspecified: Secondary | ICD-10-CM | POA: Insufficient documentation

## 2015-09-02 DIAGNOSIS — K219 Gastro-esophageal reflux disease without esophagitis: Secondary | ICD-10-CM | POA: Diagnosis not present

## 2015-09-02 DIAGNOSIS — Z79899 Other long term (current) drug therapy: Secondary | ICD-10-CM | POA: Insufficient documentation

## 2015-09-02 DIAGNOSIS — M19012 Primary osteoarthritis, left shoulder: Secondary | ICD-10-CM | POA: Diagnosis not present

## 2015-09-02 DIAGNOSIS — I739 Peripheral vascular disease, unspecified: Secondary | ICD-10-CM | POA: Diagnosis not present

## 2015-09-02 DIAGNOSIS — E039 Hypothyroidism, unspecified: Secondary | ICD-10-CM | POA: Insufficient documentation

## 2015-09-02 DIAGNOSIS — S92351A Displaced fracture of fifth metatarsal bone, right foot, initial encounter for closed fracture: Secondary | ICD-10-CM | POA: Diagnosis not present

## 2015-09-02 DIAGNOSIS — Z01812 Encounter for preprocedural laboratory examination: Secondary | ICD-10-CM | POA: Insufficient documentation

## 2015-09-02 DIAGNOSIS — Z7982 Long term (current) use of aspirin: Secondary | ICD-10-CM | POA: Insufficient documentation

## 2015-09-02 DIAGNOSIS — Z01818 Encounter for other preprocedural examination: Secondary | ICD-10-CM | POA: Diagnosis not present

## 2015-09-02 DIAGNOSIS — M79671 Pain in right foot: Secondary | ICD-10-CM | POA: Diagnosis not present

## 2015-09-02 DIAGNOSIS — I10 Essential (primary) hypertension: Secondary | ICD-10-CM | POA: Insufficient documentation

## 2015-09-02 HISTORY — DX: Fracture of unspecified phalanx of unspecified thumb, initial encounter for closed fracture: S62.509A

## 2015-09-02 HISTORY — DX: Personal history of other diseases of the respiratory system: Z87.09

## 2015-09-02 HISTORY — DX: Family history of other specified conditions: Z84.89

## 2015-09-02 HISTORY — DX: Personal history of urinary calculi: Z87.442

## 2015-09-02 HISTORY — DX: Unspecified fracture of unspecified foot, initial encounter for closed fracture: S92.909A

## 2015-09-02 LAB — BASIC METABOLIC PANEL
Anion gap: 10 (ref 5–15)
BUN: 19 mg/dL (ref 6–20)
CHLORIDE: 104 mmol/L (ref 101–111)
CO2: 22 mmol/L (ref 22–32)
CREATININE: 0.91 mg/dL (ref 0.44–1.00)
Calcium: 9.3 mg/dL (ref 8.9–10.3)
GFR calc Af Amer: >60 mL/min (ref >60)
GFR, EST NON AFRICAN AMERICAN: 58 mL/min — AB (ref >60)
GLUCOSE: 101 mg/dL — AB (ref 65–99)
POTASSIUM: 4.2 mmol/L (ref 3.5–5.1)
SODIUM: 136 mmol/L (ref 135–145)

## 2015-09-02 LAB — CBC
HEMATOCRIT: 33.8 % — AB (ref 36.0–46.0)
Hemoglobin: 10.7 g/dL — ABNORMAL LOW (ref 12.0–15.0)
MCH: 29.2 pg (ref 26.0–34.0)
MCHC: 31.7 g/dL (ref 30.0–36.0)
MCV: 92.3 fL (ref 78.0–100.0)
PLATELETS: 289 10*3/uL (ref 150–400)
RBC: 3.66 MIL/uL — ABNORMAL LOW (ref 3.87–5.11)
RDW: 13.8 % (ref 11.5–15.5)
WBC: 8.3 10*3/uL (ref 4.0–10.5)

## 2015-09-02 LAB — SURGICAL PCR SCREEN
MRSA, PCR: NEGATIVE
Staphylococcus aureus: NEGATIVE

## 2015-09-02 NOTE — Progress Notes (Signed)
PCP - Dr. Carol Ada Cardiologist - Dr. Fransico Him  EKG - 10/2014 - Epic CXR- pt. Denies  Echo - 04/2014 - Epic Stress Test- requested Cardiac Cath - requested  Pt. Denies chest pain and shortness of breath at PAT appointment.    Patient does complain of left breast pain that she states is from fall.  She states that it is difficult for her to take deep breaths today.   Patient did have a fall yesterday at home and presented to ER.  Patient states she has a fracture to her right thumb and a fracture in her right foot.  Patient has a walking boot on right foot and states that boot can be removed when sitting in a chair or laying down but foot will need to be supported by pillows.

## 2015-09-02 NOTE — Pre-Procedure Instructions (Signed)
    SHIRLEYMAE HAUTH  09/02/2015      [facility] - [place], [place] - [address] [place] [address] Phone: [phone] Fax: [phone]    Your procedure is scheduled on Friday, September 30th, 2016  Report to [facility] Admitting at 5:30 A.M.  Call this number if you have problems the morning of surgery:  [phone]   Remember:  Do not eat food or drink liquids after midnight.   Take these medicines the morning of surgery with A SIP OF WATER: Albuterol inhaler (please bring with you), Carbidopa-Levodopa (Sinemet), Dexlansoprazole (Dexilant), Diltiazem (Cardizem), Duloxetine (Cymbalta), Levothyroxine (Synthroid), Metoprolol Tartrate (Lopressor), eye drops, Pramipexole (Mirapex), Tramadol (Ultram) if needed.  Stop taking: Aspirin, NSAIDS, Aleve, Naproxen, Ibuprofen, Motrin, Advil, Fish oil, all herbal medications, and all vitamins.    Do not wear jewelry, make-up or nail polish.  Do not wear lotions, powders, or perfumes.  You may wear deodorant.  Do not shave 48 hours prior to surgery.    Do not bring valuables to the hospital.  [facility] is not responsible for any belongings or valuables.  Contacts, dentures or bridgework may not be worn into surgery.  Leave your suitcase in the car.  After surgery it may be brought to your room.  For patients admitted to the hospital, discharge time will be determined by your treatment team.  Patients discharged the day of surgery will not be allowed to drive home.   Special instructions:  See attached.   Please read over the following fact sheets that you were given. Pain Booklet, Coughing and Deep Breathing, MRSA Information and Surgical Site Infection Prevention

## 2015-09-05 DIAGNOSIS — M6281 Muscle weakness (generalized): Secondary | ICD-10-CM | POA: Diagnosis not present

## 2015-09-05 DIAGNOSIS — M25531 Pain in right wrist: Secondary | ICD-10-CM | POA: Diagnosis not present

## 2015-09-05 DIAGNOSIS — G20 Parkinson's disease: Secondary | ICD-10-CM | POA: Diagnosis not present

## 2015-09-05 DIAGNOSIS — R29898 Other symptoms and signs involving the musculoskeletal system: Secondary | ICD-10-CM | POA: Diagnosis not present

## 2015-09-05 DIAGNOSIS — R2681 Unsteadiness on feet: Secondary | ICD-10-CM | POA: Diagnosis not present

## 2015-09-05 DIAGNOSIS — M79601 Pain in right arm: Secondary | ICD-10-CM | POA: Diagnosis not present

## 2015-09-05 NOTE — Progress Notes (Signed)
Anesthesia Chart Review:  Pt is 81 year old female scheduled for lumbar spinal cord stimulator insertion on 09/09/2015 with Dr. Maryjean Ka.   PCP is Dr. Carol Ada. Cardiologist is Dr. Fransico Him, sees her annually, last office visit 10/28/15.   PMH includes: CHF, PVD, heart murmur, HTN, hyperlipidemia, Parkinson's Disease, anemia, Sjoren's disease, hypothyroidism, GERD. Never smoker. BMI 33. S/p TLIF 03/20/12.   Medications include: albuterol, ASA, carbidopa-levodopa, denosumab, diltiazem, zetia, hydroxychloroquine, levothyroxine, lovastatin, metoprolol, potassium, mirapex, trimethoprim.   Preoperative labs reviewed.    EKG 10/27/2014: NSR with no ST changes per Dr. Theodosia Blender interpretation.    Echo 05/06/2014:  - Left ventricle: The cavity size was normal. Wall thickness was normal. Systolic function was normal. The estimated ejection fraction was in the range of 60% to 65%. Wall motion was normal; there were no regional wall motion abnormalities. Doppler parameters are consistent with abnormal left ventricular relaxation (grade 1 diastolic dysfunction). LV filling pressure is indeterminate. - Aortic valve: Mildly calcified leaflets. Transvalvular velocity was minimally increased. There was no stenosis. There was no regurgitation. - Mitral valve: Structurally normal valve. There was trace to mild regurgitation. - Left atrium: The atrium was normal in size. - Atrial septum: There was increased thickness of the septum, consistent with lipomatous hypertrophy. - Pulmonary arteries: PA peak pressure: 19 mm Hg (S).  Nuclear stress test 05/26/2013:  -Normal myocardial perfusion study. Post-stress EF 84%  Cardiac cath 01/19/2005: -Normal coronary arteries -Normal LV function  If no changes, I anticipate pt can proceed with surgery as scheduled.   Willeen Cass, FNP-BC [facility]/Anesthesiology Phone: [phone] 09/05/2015 3:10 PM

## 2015-09-08 DIAGNOSIS — N200 Calculus of kidney: Secondary | ICD-10-CM | POA: Diagnosis not present

## 2015-09-08 DIAGNOSIS — Z23 Encounter for immunization: Secondary | ICD-10-CM | POA: Diagnosis not present

## 2015-09-08 DIAGNOSIS — M810 Age-related osteoporosis without current pathological fracture: Secondary | ICD-10-CM | POA: Diagnosis not present

## 2015-09-08 MED ORDER — CEFAZOLIN SODIUM-DEXTROSE 2-3 GM-% IV SOLR
2.0000 g | INTRAVENOUS | Status: AC
  Administered 2015-09-09: 2 g via INTRAVENOUS
  Filled 2015-09-08: qty 50

## 2015-09-08 NOTE — H&P (Signed)
[name] is an 81 y.o. female.   Chief Complaint: back pain with radiation into the lower extremities HPI: patient with a history of multiple medical problems, including degenerative disease of the lumbar spine.  She is under gone multiple operations, still continues to have low back pain with radiating symptoms into the lower extremity.  Given her poor tolerance for medications, and virtually no response to interventional treatment, SCS was offered as an ultimate modality.  She underwent a psychological evaluation which deemed her to be a good candidate.  She subsequently underwent SCS trial.  Today she returns stating that the spinal cord stimulator was "very helpful".  She received approximately 90-95% improvement in her pain.  She has some twinges of pain on the right side but the left side is almost completely resolved.  She does have some improvement in her lower extremity symptoms but does continue to complain of bilateral knee pain.  This is likely related to an arthritic process.  She rates her pain as zero out of 10 today.  She was able to walk better while utilizing the stimulator as well as sleep better.  She does continue to utilize a cane for stability.  She is extremely pleased with the amount of relief she has a cannot wait to proceed with permanent placement.   Past Medical History  Diagnosis Date  . Hypothyroidism   . Shortness of breath   . H/O hiatal hernia   . Arthritis   . Anxiety   . Neuropathy   . Tremor   . Anemia   . Osteoporosis   . CHF (congestive heart failure)   . Parkinson disease   . Fibromyalgia   . Restless leg   . Sjogren's disease   . Hypertension     dr t turner  . GERD (gastroesophageal reflux disease)   . Hypercholesterolemia   . Diastolic dysfunction   . SOB (shortness of breath)     chronic due to diastolic dysfunction, deconditioning, obesity  . PVD (peripheral vascular disease)     99% stenosis of left anteiror tibial artery, mod  stenosis of left distal SFA and popliteal artery followed by [name]  . Chronic diastolic CHF (congestive heart failure)   . Broken arm     right   . Family history of adverse reaction to anesthesia     pt. states sister vomits  . History of kidney stones   . Fracture of thumb 09/01/2015    right  . Fracture, foot 09/01/2015    right  . History of bronchitis   . Heart murmur     Past Surgical History  Procedure Laterality Date  . Abdominal hysterectomy    . Wrist fracture surgery Bilateral   . Falls      variious fall, broken wrist,and toes  . Total hip arthroplasty      right  . Cardiac catheterization  2006    normal  . Spine surgery  April 2013    Back X's 4  . Fracture surgery Right April 2016    Wrist, Pt. fell  . Eye surgery Bilateral     cateracts  . Back surgery      4 back surgeries,   lumbar fusion    Family History  Problem Relation Age of Onset  . Heart attack Mother   . Restless legs syndrome Mother   . Heart failure Mother   . Heart disease Mother   . Hypertension Mother   . COPD Father  Social History:  reports that she has never smoked. She has never used smokeless tobacco. She reports that she does not drink alcohol or use illicit drugs.  Allergies:  Allergies  Allergen Reactions  . Codeine Other (See Comments)    headache  . Lactose Intolerance (Gi) Nausea And Vomiting and Other (See Comments)    Reactions to meds that contain lactose derivitives and foods  . Lyrica [Pregabalin] Other (See Comments)    Leg edema   . Other Other (See Comments)    Pain meds received after surgery caused nightmares (Percocet,Hydrocodone,Morphine)  . Plaquenil [Hydroxychloroquine Sulfate] Other (See Comments)    Stomach upset  . Reglan [Metoclopramide] Other (See Comments)    Stomach upset  . Requip [Ropinirole Hcl] Other (See Comments)    Stomach upset   . Septra [Sulfamethoxazole-Trimethoprim] Nausea And Vomiting  . Shellfish Allergy Nausea And  Vomiting    Upset stomach  . Latex Rash    Redness and sore (steri strips under a cast caused a rash spring 2016)    Medications Prior to Admission  Medication Sig Dispense Refill  . albuterol (PROAIR HFA) 108 (90 BASE) MCG/ACT inhaler Inhale 2 puffs into the lungs every 4 (four) hours as needed for wheezing or shortness of breath.    Marland Kitchen aspirin EC 81 MG tablet Take 81 mg by mouth daily.    . Calcium Carb-Cholecalciferol (CALCIUM + D3 PO) Take by mouth. Calcium 1000 mg    . carbidopa-levodopa (SINEMET IR) 25-100 MG per tablet Take 2 tablets by mouth 3 (three) times daily. 180 tablet 12  . celecoxib (CELEBREX) 200 MG capsule Take 200 mg by mouth 2 (two) times daily.    . cephALEXin (KEFLEX) 500 MG capsule Take 1 capsule (500 mg total) by mouth 2 (two) times daily. (Patient taking differently: Take 500 mg by mouth 2 (two) times daily. 7 day course filled 09/01/15) 14 capsule 0  . Cholecalciferol (VITAMIN D3) 2000 UNITS TABS Take 2,000 Units by mouth daily.     . Coenzyme Q10 100 MG TABS Take 100 mg by mouth daily.    Marland Kitchen conjugated estrogens (PREMARIN) vaginal cream Place 1 Applicatorful vaginally daily as needed (dryness).    Marland Kitchen denosumab (PROLIA) 60 MG/ML SOLN injection Inject 60 mg into the skin every 6 (six) months. Administer in upper arm, thigh, or abdomen    . dexlansoprazole (DEXILANT) 60 MG capsule Take 60 mg by mouth daily.    Marland Kitchen diltiazem (CARDIZEM CD) 120 MG 24 hr capsule Take 120 mg by mouth daily.    . diphenhydramine-acetaminophen (TYLENOL PM) 25-500 MG TABS Take 1 tablet by mouth at bedtime.    . docusate sodium (COLACE) 100 MG capsule Take 100 mg by mouth at bedtime.    . DULoxetine (CYMBALTA) 20 MG capsule Take 20 mg by mouth daily.   2  . ezetimibe (ZETIA) 10 MG tablet Take 10 mg by mouth at bedtime.     . hydroxychloroquine (PLAQUENIL) 200 MG tablet Take 200 mg by mouth daily.  0  . hypromellose (SYSTANE OVERNIGHT THERAPY) 0.3 % GEL ophthalmic ointment Place 1 application into  both eyes at bedtime.    Marland Kitchen levothyroxine (SYNTHROID, LEVOTHROID) 50 MCG tablet Take 50 mcg by mouth daily.    Marland Kitchen lovastatin (MEVACOR) 40 MG tablet Take 40 mg by mouth at bedtime.    . metoprolol tartrate (LOPRESSOR) 25 MG tablet Take 25 mg by mouth daily.    . Multiple Vitamin (MULTIVITAMIN WITH MINERALS) TABS tablet Take 1  tablet by mouth daily.    . nortriptyline (PAMELOR) 25 MG capsule Take 25 mg by mouth at bedtime.   4  . Polyethyl Glycol-Propyl Glycol (SYSTANE) 0.4-0.3 % SOLN Place 1 drop into both eyes daily.     . Potassium Citrate-Citric Acid 3300-1002 MG PACK Take 1 packet by mouth daily. "Citra K" dissolve in 6 oz liquid and drink    . pramipexole (MIRAPEX) 1 MG tablet Take 1 tablet (1 mg total) by mouth 3 (three) times daily. 90 tablet 12  . Skin Protectants, Misc. (EUCERIN) cream Apply 1 application topically as needed (Applies under breast and arm for skin irritation.).    Marland Kitchen solifenacin (VESICARE) 10 MG tablet Take 10 mg by mouth daily.    . traMADol (ULTRAM) 50 MG tablet Take 50 mg by mouth See admin instructions. Take 1 tablet (50 mg) by mouth every morning with 200 mg ER tablet, may take an additional tablet (50 mg) two more times daily as needed for pain    . traMADol (ULTRAM) 50 MG tablet Take 1 tablet (50 mg total) by mouth every 6 (six) hours as needed. 20 tablet 0  . traMADol (ULTRAM-ER) 200 MG 24 hr tablet Take 200 mg by mouth See admin instructions. Take 1 tablet (200 mg ER) by mouth every morning with a 50 mg tablet    . trimethoprim (TRIMPEX) 100 MG tablet Take 100 mg by mouth daily.   2    No results found for this or any previous visit (from the past 48 hour(s)). No results found.  Review of Systems  Constitutional: Negative.   Eyes: Negative.   Respiratory: Negative.   Cardiovascular: Negative.   Gastrointestinal: Negative.   Musculoskeletal: Positive for back pain, joint pain and neck pain. Negative for myalgias and falls.  Skin: Negative.   Neurological:  Negative.   Endo/Heme/Allergies: Negative.   Psychiatric/Behavioral: Negative.     Blood pressure 117/55, pulse 64, temperature 98.6 F (37 C), temperature source Oral, resp. rate 20, weight 73.483 kg (162 lb), SpO2 98 %. Physical Exam  Constitutional: She is oriented to person, place, and time. She appears well-developed and well-nourished.  HENT:  Head: Normocephalic and atraumatic.  Eyes: EOM are normal. Pupils are equal, round, and reactive to light.  Neck: Normal range of motion.  Cardiovascular: Normal rate and regular rhythm.   Musculoskeletal: Normal range of motion.  Neurological: She is alert and oriented to person, place, and time.  Skin: Skin is warm and dry.  Psychiatric: She has a normal mood and affect. Her behavior is normal. Thought content normal.     Assessment/Plan A) chronic pain Lumbar post-laminectomy syndrome Back pain  P) permanent SCS placement, [employer]  Bonna Gains 09/09/2015, 7:22 AM

## 2015-09-09 ENCOUNTER — Ambulatory Visit (HOSPITAL_COMMUNITY): Payer: Medicare Other

## 2015-09-09 ENCOUNTER — Encounter (HOSPITAL_COMMUNITY): Payer: Self-pay | Admitting: *Deleted

## 2015-09-09 ENCOUNTER — Ambulatory Visit (HOSPITAL_COMMUNITY): Payer: Medicare Other | Admitting: Certified Registered Nurse Anesthetist

## 2015-09-09 ENCOUNTER — Encounter (HOSPITAL_COMMUNITY)
Admission: RE | Disposition: A | Discharge status: Home / Self Care | Payer: Self-pay | Source: Ambulatory Visit | Attending: Anesthesiology

## 2015-09-09 ENCOUNTER — Ambulatory Visit (HOSPITAL_COMMUNITY): Payer: Medicare Other | Admitting: Emergency Medicine

## 2015-09-09 ENCOUNTER — Ambulatory Visit (HOSPITAL_COMMUNITY)
Admission: RE | Admit: 2015-09-09 | Discharge: 2015-09-09 | Disposition: A | Discharge status: Home / Self Care | Payer: Medicare Other | Source: Ambulatory Visit | Attending: Anesthesiology | Admitting: Anesthesiology

## 2015-09-09 DIAGNOSIS — M549 Dorsalgia, unspecified: Secondary | ICD-10-CM

## 2015-09-09 DIAGNOSIS — M5416 Radiculopathy, lumbar region: Secondary | ICD-10-CM | POA: Insufficient documentation

## 2015-09-09 DIAGNOSIS — I509 Heart failure, unspecified: Secondary | ICD-10-CM | POA: Diagnosis not present

## 2015-09-09 DIAGNOSIS — M961 Postlaminectomy syndrome, not elsewhere classified: Secondary | ICD-10-CM | POA: Diagnosis not present

## 2015-09-09 DIAGNOSIS — G8929 Other chronic pain: Secondary | ICD-10-CM | POA: Diagnosis not present

## 2015-09-09 DIAGNOSIS — E039 Hypothyroidism, unspecified: Secondary | ICD-10-CM | POA: Diagnosis not present

## 2015-09-09 DIAGNOSIS — I5032 Chronic diastolic (congestive) heart failure: Secondary | ICD-10-CM | POA: Diagnosis not present

## 2015-09-09 DIAGNOSIS — G20 Parkinson's disease: Secondary | ICD-10-CM | POA: Diagnosis not present

## 2015-09-09 DIAGNOSIS — K219 Gastro-esophageal reflux disease without esophagitis: Secondary | ICD-10-CM | POA: Diagnosis not present

## 2015-09-09 DIAGNOSIS — Z7982 Long term (current) use of aspirin: Secondary | ICD-10-CM | POA: Diagnosis not present

## 2015-09-09 DIAGNOSIS — M47816 Spondylosis without myelopathy or radiculopathy, lumbar region: Secondary | ICD-10-CM | POA: Diagnosis not present

## 2015-09-09 DIAGNOSIS — R0602 Shortness of breath: Secondary | ICD-10-CM | POA: Diagnosis not present

## 2015-09-09 DIAGNOSIS — M545 Low back pain: Secondary | ICD-10-CM | POA: Diagnosis not present

## 2015-09-09 HISTORY — PX: SPINAL CORD STIMULATOR INSERTION: SHX5378

## 2015-09-09 SURGERY — INSERTION, SPINAL CORD STIMULATOR, LUMBAR
Anesthesia: Monitor Anesthesia Care | Site: Back

## 2015-09-09 MED ORDER — SUCCINYLCHOLINE CHLORIDE 20 MG/ML IJ SOLN
INTRAMUSCULAR | Status: AC
  Filled 2015-09-09: qty 1

## 2015-09-09 MED ORDER — FENTANYL CITRATE (PF) 250 MCG/5ML IJ SOLN
INTRAMUSCULAR | Status: AC
  Filled 2015-09-09: qty 5

## 2015-09-09 MED ORDER — PROPOFOL 10 MG/ML IV BOLUS
INTRAVENOUS | Status: AC
  Filled 2015-09-09: qty 20

## 2015-09-09 MED ORDER — HYDROCODONE-ACETAMINOPHEN 10-325 MG PO TABS: 1.0000 | ORAL_TABLET | Freq: Four times a day (QID) | ORAL | Status: DC | PRN

## 2015-09-09 MED ORDER — ONDANSETRON HCL 4 MG/2ML IJ SOLN
INTRAMUSCULAR | Status: DC | PRN
  Administered 2015-09-09: 4 mg via INTRAVENOUS

## 2015-09-09 MED ORDER — EPHEDRINE SULFATE 50 MG/ML IJ SOLN
INTRAMUSCULAR | Status: AC
  Filled 2015-09-09: qty 1

## 2015-09-09 MED ORDER — 0.9 % SODIUM CHLORIDE (POUR BTL) OPTIME
TOPICAL | Status: DC | PRN
  Administered 2015-09-09: 1000 mL

## 2015-09-09 MED ORDER — SODIUM CHLORIDE 0.9 % IR SOLN
Status: DC | PRN
  Administered 2015-09-09: 500 mL

## 2015-09-09 MED ORDER — ROCURONIUM BROMIDE 50 MG/5ML IV SOLN
INTRAVENOUS | Status: AC
  Filled 2015-09-09: qty 1

## 2015-09-09 MED ORDER — PROPOFOL 1000 MG/100ML IV EMUL
INTRAVENOUS | Status: AC
  Filled 2015-09-09: qty 200

## 2015-09-09 MED ORDER — SODIUM CHLORIDE 0.9 % IJ SOLN
INTRAMUSCULAR | Status: AC
  Filled 2015-09-09: qty 10

## 2015-09-09 MED ORDER — BUPIVACAINE-EPINEPHRINE (PF) 0.5% -1:200000 IJ SOLN
INTRAMUSCULAR | Status: DC | PRN
  Administered 2015-09-09: 30 mL via PERINEURAL

## 2015-09-09 MED ORDER — PROPOFOL 500 MG/50ML IV EMUL
INTRAVENOUS | Status: DC | PRN
  Administered 2015-09-09: 50 ug/kg/min via INTRAVENOUS

## 2015-09-09 MED ORDER — CEPHALEXIN 500 MG PO CAPS: 500.0000 mg | ORAL_CAPSULE | Freq: Four times a day (QID) | ORAL | Status: DC

## 2015-09-09 MED ORDER — LACTATED RINGERS IV SOLN
INTRAVENOUS | Status: DC | PRN
  Administered 2015-09-09: 07:00:00 via INTRAVENOUS

## 2015-09-09 MED ORDER — PROPOFOL 10 MG/ML IV BOLUS
INTRAVENOUS | Status: DC | PRN
  Administered 2015-09-09 (×4): 20 mg via INTRAVENOUS
  Administered 2015-09-09: 10 mg via INTRAVENOUS

## 2015-09-09 MED ORDER — LIDOCAINE HCL (CARDIAC) 20 MG/ML IV SOLN
INTRAVENOUS | Status: AC
  Filled 2015-09-09: qty 5

## 2015-09-09 MED ORDER — FENTANYL CITRATE (PF) 100 MCG/2ML IJ SOLN
INTRAMUSCULAR | Status: DC | PRN
  Administered 2015-09-09 (×2): 50 ug via INTRAVENOUS

## 2015-09-09 SURGICAL SUPPLY — 66 items
ANCH LD 4 SETX2 CLIK X (Stimulator) ×1 IMPLANT
ANCHOR CLIK X NEURO (Stimulator) ×1 IMPLANT
APL SKNCLS STERI-STRIP NONHPOA (GAUZE/BANDAGES/DRESSINGS)
BAG DECANTER FOR FLEXI CONT (MISCELLANEOUS) ×2 IMPLANT
BENZOIN TINCTURE PRP APPL 2/3 (GAUZE/BANDAGES/DRESSINGS) IMPLANT
BINDER ABDOMINAL 12 ML 46-62 (SOFTGOODS) ×2 IMPLANT
BLADE CLIPPER SURG (BLADE) IMPLANT
CABLE/EXTENSION OR 1X16 61 (CABLE) ×2 IMPLANT
CHLORAPREP W/TINT 26ML (MISCELLANEOUS) ×2 IMPLANT
CLIP TI WIDE RED SMALL 6 (CLIP) IMPLANT
DRAPE C-ARM 42X72 X-RAY (DRAPES) ×2 IMPLANT
DRAPE C-ARMOR (DRAPES) ×2 IMPLANT
DRAPE LAPAROTOMY 100X72X124 (DRAPES) ×2 IMPLANT
DRAPE POUCH INSTRU U-SHP 10X18 (DRAPES) ×2 IMPLANT
DRAPE SURG 17X23 STRL (DRAPES) ×2 IMPLANT
DRSG OPSITE POSTOP 3X4 (GAUZE/BANDAGES/DRESSINGS) ×1 IMPLANT
DRSG OPSITE POSTOP 4X6 (GAUZE/BANDAGES/DRESSINGS) ×1 IMPLANT
ELECT REM PT RETURN 9FT ADLT (ELECTROSURGICAL) ×2
ELECTRODE REM PT RTRN 9FT ADLT (ELECTROSURGICAL) ×1 IMPLANT
FREELINK REMOTE CONTROL KIT ×1 IMPLANT
GAUZE SPONGE 4X4 16PLY XRAY LF (GAUZE/BANDAGES/DRESSINGS) ×2 IMPLANT
GLOVE BIOGEL PI IND STRL 7.5 (GLOVE) ×1 IMPLANT
GLOVE BIOGEL PI INDICATOR 7.5 (GLOVE) ×1
GLOVE ECLIPSE 7.5 STRL STRAW (GLOVE) ×2 IMPLANT
GLOVE EXAM NITRILE LRG STRL (GLOVE) IMPLANT
GLOVE EXAM NITRILE MD LF STRL (GLOVE) IMPLANT
GLOVE EXAM NITRILE XL STR (GLOVE) IMPLANT
GLOVE EXAM NITRILE XS STR PU (GLOVE) IMPLANT
GOWN STRL REUS W/ TWL LRG LVL3 (GOWN DISPOSABLE) IMPLANT
GOWN STRL REUS W/ TWL XL LVL3 (GOWN DISPOSABLE) IMPLANT
GOWN STRL REUS W/TWL 2XL LVL3 (GOWN DISPOSABLE) IMPLANT
GOWN STRL REUS W/TWL LRG LVL3 (GOWN DISPOSABLE)
GOWN STRL REUS W/TWL XL LVL3 (GOWN DISPOSABLE)
IPG PRECISION SPECTRA (Stimulator) ×1 IMPLANT
KIT BASIN OR (CUSTOM PROCEDURE TRAY) ×2 IMPLANT
KIT CHARGING (KITS) ×1
KIT CHARGING PRECISION NEURO (KITS) IMPLANT
KIT PAT PROGRAM FREELINK (KITS) IMPLANT
KIT ROOM TURNOVER OR (KITS) ×2 IMPLANT
KIT SPLITTER 30CM 2X8 (Stimulator) ×2 IMPLANT
LEAD KIT CONTACT INFINION 16 (Stimulator) ×2 IMPLANT
LIQUID BAND (GAUZE/BANDAGES/DRESSINGS) ×2 IMPLANT
NDL 18GX1X1/2 (RX/OR ONLY) (NEEDLE) IMPLANT
NDL HYPO 25X1 1.5 SAFETY (NEEDLE) ×1 IMPLANT
NEEDLE 18GX1X1/2 (RX/OR ONLY) (NEEDLE) IMPLANT
NEEDLE HYPO 25X1 1.5 SAFETY (NEEDLE) ×2 IMPLANT
NS IRRIG 1000ML POUR BTL (IV SOLUTION) ×2 IMPLANT
PACK LAMINECTOMY NEURO (CUSTOM PROCEDURE TRAY) ×2 IMPLANT
PAD ARMBOARD 7.5X6 YLW CONV (MISCELLANEOUS) ×2 IMPLANT
REMOTE CONTROL KIT (KITS) ×2
SPONGE LAP 4X18 X RAY DECT (DISPOSABLE) ×2 IMPLANT
SPONGE SURGIFOAM ABS GEL SZ50 (HEMOSTASIS) IMPLANT
STAPLER SKIN PROX WIDE 3.9 (STAPLE) ×2 IMPLANT
STRIP CLOSURE SKIN 1/2X4 (GAUZE/BANDAGES/DRESSINGS) IMPLANT
SUT MNCRL AB 4-0 PS2 18 (SUTURE) IMPLANT
SUT SILK 0 (SUTURE) ×2
SUT SILK 0 MO-6 18XCR BRD 8 (SUTURE) ×1 IMPLANT
SUT SILK 0 TIES 10X30 (SUTURE) IMPLANT
SUT SILK 2 0 TIES 10X30 (SUTURE) IMPLANT
SUT VIC AB 2-0 CP2 18 (SUTURE) ×4 IMPLANT
SYR EPIDURAL 5ML GLASS (SYRINGE) ×2 IMPLANT
SYRINGE 10CC LL (SYRINGE) IMPLANT
TOWEL OR 17X24 6PK STRL BLUE (TOWEL DISPOSABLE) ×2 IMPLANT
TOWEL OR 17X26 10 PK STRL BLUE (TOWEL DISPOSABLE) ×2 IMPLANT
WATER STERILE IRR 1000ML POUR (IV SOLUTION) ×2 IMPLANT
YANKAUER SUCT BULB TIP NO VENT (SUCTIONS) ×2 IMPLANT

## 2015-09-09 NOTE — Op Note (Signed)
PREOP DX: 1) lumbago  2) lumbar radiculopathy  3) lumbar post-laminectomy syndrome  4) chronic pain  POSTOP DX: 1) lumbago  2) lumbar radiculopathy  3) lumbar post-laminectomy syndrome  4) chronic pain  PROCEDURES PERFORMED:1) intraop fluoro 2) placement of 2 16 contact [employer] Infinion leads 3) placement of Spectra SCS generator  SURGEON:Harkins  ASSISTANT: NONE  ANESTHESIA: MAC  EBL: <20cc  DESCRIPTION OF PROCEDURE: After a discussion of risks, benefits and alternatives, informed consent was obtained. The patient was taken to the OR, turned prone onto a Jackson table, all pressure points padded, SCD's placed, and an adequate plane of anesthesia induced. A timeout was taken to verify the correct patient, position, personnel, availability of appropriate equipment, and administration of perioperative antibiotics.  The thoracic and lumbar areas were widely prepped with chloraprep and draped into a sterile field. Fluoroscopy was used to plan a left paramedian incision at the T12-L2 levels, and an incision made with a 10 blade and carried down to the dorsolumbar fascia with the bovie and blunt dissection. Retractors were placed and a 14g [employer] tuohy needle placed into the epidural space ultimately at the T11-12  interspace using biplanar fluoro and loss-of-resistance technique. The needle was aspirated without any return of fluid. A [employer] INFINION lead was introduced and under live AP fluoro advanced until the distal-most contact overlay the superior aspect of the T6 vertebral body shadow with the rest of the contacts distributed over the T7-9 vertebral bodies in a position just right of anatomic midline. A second Infinion lead was placed just left of anatomic midline in the same levels using the same technique. The patient was awakened and the leads tested; impedances were good, and the patient reported good coverage with amplitudes in the 5-8  mA range. 0  silk sutures were placed in the fascia adjacent to the needles. The needles and stylets were removed under fluoroscopy with no lead migration noted. Leads were then fixed to the fascia with Clik anchors; repeat images were obtained to verify that there had been no lead migration.  The incision was inspected and hemostasis obtained with the bipolar cautery.  Attention was then turned to creation of a subcutaneous pocket. At the left flank, a 3 cm incision was made with a 10 blade and using the bovie and blunt dissection a pocket of size appropriate to place a SCS generator. The pocket was trialed, and found to be of adequate size. The pocket was inspected for hemostasis, which was found to be excellent. Using reverse seldinger technique, the leads were tunneled to the pocket site, and the leads inserted into the SCS generator. Impedances were checked, and all found to be excellent. The leads were then all fixed into position with a self-torquing wrench. The wiring was all carefully coiled, placed behind the generator and placed in the pocket.  Both incisions were copiously irrigated with bacitracin-containing irrigation. The lumbar incision was closed in 2 deep layers of interrupted 2-0 vicryl and the skin closed with staples. The pocket incision was closed with a deeper layer of 2-0 vicryl interrupted sutures, and the skin closed with staples. Antibiotic ointment and sterile dressings were applied.   Needle, sponge, and instrument counts were correct x2 at the end of the case.    The patient was then carefully awakened from anesthesia, turned supine, an abdominal binder placed, and the patient taken to the recovery room where she underwent complex spinal cord stimulator programming.  COMPLICATIONS: NONE  CONDITION: Stable throughout  the course of the procedure and immediately afterward  DISPOSITION: discharge to home, with antibiotics and pain medicine. Discussed care with the patient and her  husband. Followup in clinic will be scheduled in 10-14 days

## 2015-09-09 NOTE — Transfer of Care (Signed)
Immediate Anesthesia Transfer of Care Note  Patient: Amanda BELONGIA  Procedure(s) Performed: Procedure(s) with comments: LUMBAR SPINAL CORD STIMULATOR INSERTION (N/A) - LUMBAR SPINAL CORD STIMULATOR INSERTION  Patient Location: PACU  Anesthesia Type:MAC  Level of Consciousness: awake, alert , oriented and patient cooperative  Airway & Oxygen Therapy: Patient Spontanous Breathing  Post-op Assessment: Report given to RN, Post -op Vital signs reviewed and stable and Patient moving all extremities X 4  Post vital signs: Reviewed and stable  Last Vitals:  Filed Vitals:   09/09/15 0628  BP: 117/55  Pulse: 64  Temp: 37 C  Resp: 20    Complications: No apparent anesthesia complications

## 2015-09-09 NOTE — Discharge Instructions (Signed)
Dr. Rally Ouch Post-Op Orders ° °• Ice Pack - 20 minutes on (in a pillow case), and 20 minutes off. Wear the ice pack UNDER the binder. °• Follow up in office, they will call you for an appointment in 10 days to 2 weeks. °• Increase activity gradually.   °• No lifting anything heavier than a gallon of milk (10 pounds) until seen in the office. °• Advance diet slowly as tolerated. °• Dressing care:  Keep dressing dry for 3 days, and on Post-op day 4, may shower. °• Call for fever, drainage, and redness. °• No swimming or bathing in a bathtub (do not get into standing water). °•  °

## 2015-09-09 NOTE — Anesthesia Postprocedure Evaluation (Signed)
  Anesthesia Post-op Note  Patient: [name]  Procedure(s) Performed: Procedure(s) (LRB): LUMBAR SPINAL CORD STIMULATOR INSERTION (N/A)  Patient Location: PACU  Anesthesia Type: MAC  Level of Consciousness: awake and alert   Airway and Oxygen Therapy: Patient Spontanous Breathing  Post-op Pain: mild  Post-op Assessment: Post-op Vital signs reviewed, Patient's Cardiovascular Status Stable, Respiratory Function Stable, Patent Airway and No signs of Nausea or vomiting  Last Vitals:  Filed Vitals:   09/09/15 0930  BP: 92/57  Pulse: 64  Temp:   Resp: 15    Post-op Vital Signs: stable   Complications: No apparent anesthesia complications

## 2015-09-09 NOTE — Anesthesia Preprocedure Evaluation (Addendum)
Anesthesia Evaluation  Patient identified by MRN, date of birth, ID band Patient awake    Reviewed: Allergy & Precautions, H&P , NPO status , Patient's Chart, lab work & pertinent test results, reviewed documented beta blocker date and time   History of Anesthesia Complications (+) PONV and history of anesthetic complications  Airway Mallampati: II  TM Distance: >3 FB Neck ROM: Full    Dental no notable dental hx. (+) Teeth Intact, Dental Advisory Given,    Pulmonary shortness of breath and with exertion,    Pulmonary exam normal breath sounds clear to auscultation       Cardiovascular hypertension, On Home Beta Blockers and Pt. on home beta blockers  Rhythm:Regular Rate:Normal     Neuro/Psych Anxiety  Neuromuscular disease negative psych ROS   GI/Hepatic Neg liver ROS, hiatal hernia, GERD  Medicated and Controlled,  Endo/Other  negative endocrine ROSHypothyroidism   Renal/GU negative Renal ROS  negative genitourinary   Musculoskeletal  (+) Arthritis , Fibromyalgia -  Abdominal   Peds  Hematology negative hematology ROS (+)   Anesthesia Other Findings   Reproductive/Obstetrics negative OB ROS                             Anesthesia Physical  Anesthesia Plan  ASA: III  Anesthesia Plan: MAC   Post-op Pain Management:    Induction: Intravenous  Airway Management Planned: Nasal Cannula  Additional Equipment:   Intra-op Plan:   Post-operative Plan:   Informed Consent: I have reviewed the patients History and Physical, chart, labs and discussed the procedure including the risks, benefits and alternatives for the proposed anesthesia with the patient or authorized representative who has indicated his/her understanding and acceptance.   Dental advisory given  Plan Discussed with: CRNA and Anesthesiologist  Anesthesia Plan Comments: (Postlaminectomy pain syndrome, will need spinal  cord stimulator placed,  MAC)        Anesthesia Quick Evaluation

## 2015-09-11 ENCOUNTER — Other Ambulatory Visit: Payer: Self-pay | Admitting: Cardiology

## 2015-09-11 ENCOUNTER — Encounter (HOSPITAL_COMMUNITY): Payer: Self-pay | Admitting: Anesthesiology

## 2015-09-13 ENCOUNTER — Encounter (HOSPITAL_COMMUNITY): Payer: Self-pay | Admitting: Anesthesiology

## 2015-09-15 DIAGNOSIS — S52531D Colles' fracture of right radius, subsequent encounter for closed fracture with routine healing: Secondary | ICD-10-CM | POA: Diagnosis not present

## 2015-09-20 DIAGNOSIS — M545 Low back pain: Secondary | ICD-10-CM | POA: Diagnosis not present

## 2015-09-20 DIAGNOSIS — M47816 Spondylosis without myelopathy or radiculopathy, lumbar region: Secondary | ICD-10-CM | POA: Diagnosis not present

## 2015-09-20 DIAGNOSIS — Z6832 Body mass index (BMI) 32.0-32.9, adult: Secondary | ICD-10-CM | POA: Diagnosis not present

## 2015-09-20 DIAGNOSIS — M961 Postlaminectomy syndrome, not elsewhere classified: Secondary | ICD-10-CM | POA: Diagnosis not present

## 2015-09-23 DIAGNOSIS — S92351D Displaced fracture of fifth metatarsal bone, right foot, subsequent encounter for fracture with routine healing: Secondary | ICD-10-CM | POA: Diagnosis not present

## 2015-09-23 DIAGNOSIS — M79644 Pain in right finger(s): Secondary | ICD-10-CM | POA: Diagnosis not present

## 2015-10-06 DIAGNOSIS — S52531D Colles' fracture of right radius, subsequent encounter for closed fracture with routine healing: Secondary | ICD-10-CM | POA: Diagnosis not present

## 2015-10-06 DIAGNOSIS — M79644 Pain in right finger(s): Secondary | ICD-10-CM | POA: Diagnosis not present

## 2015-10-10 ENCOUNTER — Telehealth: Payer: Self-pay | Admitting: Diagnostic Neuroimaging

## 2015-10-10 NOTE — Telephone Encounter (Signed)
Patient is calling and is concerned about some people saying she is forgetting things and she also fell and broke bones in feet, thumb. Could you please speak with her to determine is she should come on in.  Thanks!

## 2015-10-10 NOTE — Telephone Encounter (Signed)
Spoke with wife and husband. Husband states her memory loss is increasing; he and patient requesting to be seen earlier than Nov 29, 2015. Husband states they would like to see if any medication would be helpful OA:CZYSAY loss. Scheduled FU for 10/13/15. Patient and husband verbalized understanding, appreciation.  He states patient is being followed for her fx bones.

## 2015-10-12 ENCOUNTER — Telehealth: Payer: Self-pay | Admitting: *Deleted

## 2015-10-12 NOTE — Telephone Encounter (Signed)
Left detailed VM advising patient's husband that her apt time tomorrow had to be adjusted by 10 min. Requested they arrive at 2:25 pm for 2:40 pm apt with Dr Leta Baptist. Apologized for this late change and advised he call back by 5 pm today if this is not convenient. Left this caller's name, number.

## 2015-10-13 ENCOUNTER — Ambulatory Visit (INDEPENDENT_AMBULATORY_CARE_PROVIDER_SITE_OTHER): Payer: Medicare Other | Admitting: Diagnostic Neuroimaging

## 2015-10-13 ENCOUNTER — Encounter: Payer: Self-pay | Admitting: Diagnostic Neuroimaging

## 2015-10-13 VITALS — BP 112/67 | HR 69 | Ht 59.0 in

## 2015-10-13 DIAGNOSIS — G20 Parkinson's disease: Secondary | ICD-10-CM | POA: Diagnosis not present

## 2015-10-13 DIAGNOSIS — R269 Unspecified abnormalities of gait and mobility: Secondary | ICD-10-CM

## 2015-10-13 DIAGNOSIS — R413 Other amnesia: Secondary | ICD-10-CM

## 2015-10-13 DIAGNOSIS — I779 Disorder of arteries and arterioles, unspecified: Secondary | ICD-10-CM | POA: Diagnosis not present

## 2015-10-13 NOTE — Patient Instructions (Signed)
Thank you for coming to see [place] at [facility]. I hope we have been able to provide you high quality care today.  You may receive a patient satisfaction survey over the next few weeks. We would appreciate your feedback and comments so that we may continue to improve ourselves and the health of our patients.  - consider donepezil, memantine or exelon for memory loss - stay active and continue stimulating activities   ~~~~~~~~~~~~~~~~~~~~~~~~~~~~~~~~~~~~~~~~~~~~~~~~~~~~~~~~~~~~~~~~~  DR. PENUMALLI'S GUIDE TO HAPPY AND HEALTHY LIVING These are some of my general health and wellness recommendations. Some of them may apply to you better than others. Please use common sense as you try these suggestions and feel free to ask me any questions.   ACTIVITY/FITNESS Mental, social, emotional and physical stimulation are very important for brain and body health. Try learning a new activity (arts, music, language, sports, games).  Keep moving your body to the best of your abilities.    NUTRITION Eat more plants: colorful vegetables, nuts, seeds and berries.  Eat less sugar, salt, preservatives and processed foods.  Avoid toxins such as cigarettes and alcohol.  Drink water when you are thirsty. Warm water with a slice of lemon is an excellent morning drink to start the day.  Consider these websites for more information The Nutrition Source (https://www.henry-hernandez.biz/) [employer] (WindowBlog.ch)   RELAXATION Consider practicing mindfulness meditation or other relaxation techniques such as deep breathing, prayer, yoga, tai chi, massage. See website mindful.org or the apps Headspace or Calm to help get started.   SLEEP Try to get at least 7-8+ hours sleep per day. Regular exercise and reduced caffeine will help you sleep better. Practice good sleep hygeine techniques. See website sleep.org for more  information.   PLANNING Prepare estate planning, living will, healthcare POA documents. Sometimes this is best planned with the help of an attorney. Theconversationproject.org and agingwithdignity.org are excellent resources.

## 2015-10-13 NOTE — Progress Notes (Signed)
[facility]  PATIENT: [name] DOB: 1931-11-07  REFERRING CLINICIAN:  HISTORY FROM: patient and husband REASON FOR VISIT: follow up   HISTORICAL  CHIEF COMPLAINT:  Chief Complaint  Patient presents with  . Memory Loss    HISTORY OF PRESENT ILLNESS:   UPDATE 10/13/15: Since last visit, more memory loss problems. Forgetting order when playing bridge. Forgetting recent conversations and names. Had fall in Aug 2016, with right foot fracture.   UPDATE 07/06/15: More issues with kidney stones, UTIs, shoulder pain. No wearing off. No tremor. More balance and freezing issues. Short term memory problems continue. Patient is in denial about need to use walker and her own memory problems.   UPDATE 03/02/15: Since last visit, patient's balance has worsened. She fell recently (02/26/15), with right non-displaced distal right radial fracture. Notes more tremor and wearing off around 4pm everyday. Currently on carb/levo 1.5tab TID and pramipexole 1mg  TID. Sometimes takes a 4th dose of carb/levo 1.5tabs. Had been using a walker, mainly at night time, but did not use at time of fall.  UPDATE 09/03/14: Since last visit, tremor is well controlled. Gait and freezing are stable,. Doing well on (carb/levo 1.5 tabs + pramipexole 1mg ) taken TID (sometimes QID, with extra late evening dose if wearing off is notable). Having more foot pain, numbness, drooling prob, flushing, anxiety. Saw psychology last visit, then rec to see psychiatry, but sxs improved and the psychiatrist who was recommended to them was not taking new patients.   UPDATE 02/24/14: Since last visit, has increased carb/levo up to 1 tab 4x per day; has also incr pramipexole up to 1mg  4x per day (for convenience of remembering, even though we normally dose BID or TID). Also with more anxiety, decr confidence, more tremor in right upper ext. Also reports some right shoulder, bilateral knee, low back pain. Has noticed some  freezing of gait. Has appt with psychology later today.  UPDATE 08/26/13: Since last visit, noticing some wearing off in early evening. Takes carb/levo 6:30am, noon, 9pm. Also getting more "worked up" lately, reading about PD online. No falls. Uses cane and walker. More fatigue and mental exhaustion.  UPDATE 02/04/13: Since last visit, has had some minor falls. tremor and PD sxs better on carb/levo. No anxiety. Some drooling and hypophonia. Not using walker or cane all the time. constipation stable.  UPDATE 10/06/12: Since last visit, had another back surgery (april 2013), dx'd with CHF, dx'd with sjogrens, also with fall in August 2013 with pelvic fracture. Reports good tremor control. Some freezing of gait.  UPDATE 02/13/12: Now on pramipexole 1mg  QID (7am, noon, 5pm, 10pm). Some wearing off around 5pm. Also, had rxn to for[facility], now with right leg pain. Also getting lumbar radiculopathy eval per ortho.  UPDATE 08/14/11: Tremor developing around 4pm now.  More anxiety per husband.  Also with UTI x 2.  Now dx'd with left foot fractures (? "stubbed" toes; no falls).  UPDATE 04/27/11: Tolerating pramipexole 1mg  TID (7am, 1-2pm, 7p).  some wearing off right at 7p.  feels unsteady, and has ongoing back problems and pain.  UPDATE 01/24/11: Doing slightly better with pramipexole 1mg  BID.  Tremor returns later in the day before evening dose.  Still with gait and balance diff.    PRIOR HPI: 81 year old female with history of hypertension, hypercholesterolemia, hypothyroidism, fibromyalgia, restless leg syndrome, presenting for evaluation of tremor for the past 3 years. She is here with her husband. Approximately 3 years ago patient developed intermittent tremor  in her left hand particularly in her thumb and fingers.  Tremor is most noticeable at rest and in the evening.  In June 2011, patient tripped, fell and broke her left wrist and forearm.  This was repaired surgically and in a cast; during this time and since  then her tremor has improved. Patient also has history of restless leg syndrome for the past 5 years. She's been on pramipexole for number of years (0.5mg  qhs) and recently increased to 1 mg qhs a few weeks ago.  The patient's mother also had restless leg syndrome. Patient has a diagnosis of iron deficiency anemia but is not on iron replacement.   REVIEW OF SYSTEMS: Full 14 system review of systems performed and notable only for fatigue appetite excessive sweating trouble swallowing drooling murmur leg swelling restless leg daytime sleepiness sleep talking joint pain joint swelling neck pain itching agitation confusion anxiety tremors easy bruising painful urination excess thirst dry mouth.    ALLERGIES: Allergies  Allergen Reactions  . Codeine Other (See Comments)    Headache Severe nightmares  "will not take it"  . Lactose Intolerance (Gi) Nausea And Vomiting and Other (See Comments)    Reactions to meds that contain lactose derivitives and foods  . Lyrica [Pregabalin] Other (See Comments)    Leg edema   . Other Other (See Comments)    Pain meds received after surgery caused nightmares (Percocet,Hydrocodone,Morphine)  . Plaquenil [Hydroxychloroquine Sulfate] Other (See Comments)    Stomach upset  . Reglan [Metoclopramide] Other (See Comments)    Stomach upset  . Requip [Ropinirole Hcl] Other (See Comments)    Stomach upset   . Septra [Sulfamethoxazole-Trimethoprim] Nausea And Vomiting  . Shellfish Allergy Nausea And Vomiting    Upset stomach  . Latex Rash    Redness and sore (steri strips under a cast caused a rash spring 2016)    HOME MEDICATIONS: Outpatient Prescriptions Prior to Visit  Medication Sig Dispense Refill  . albuterol (PROAIR HFA) 108 (90 BASE) MCG/ACT inhaler Inhale 2 puffs into the lungs every 4 (four) hours as needed for wheezing or shortness of breath.    Marland Kitchen aspirin EC 81 MG tablet Take 81 mg by mouth daily.    . Calcium Carb-Cholecalciferol (CALCIUM + D3 PO)  Take by mouth. Calcium 1000 mg    . carbidopa-levodopa (SINEMET IR) 25-100 MG per tablet Take 2 tablets by mouth 3 (three) times daily. 180 tablet 12  . celecoxib (CELEBREX) 200 MG capsule Take 200 mg by mouth 2 (two) times daily.    . cephALEXin (KEFLEX) 500 MG capsule Take 1 capsule (500 mg total) by mouth 4 (four) times daily. 30 capsule 0  . Cholecalciferol (VITAMIN D3) 2000 UNITS TABS Take 2,000 Units by mouth daily.     . Coenzyme Q10 100 MG TABS Take 100 mg by mouth daily.    Marland Kitchen conjugated estrogens (PREMARIN) vaginal cream Place 1 Applicatorful vaginally daily as needed (dryness).    Marland Kitchen denosumab (PROLIA) 60 MG/ML SOLN injection Inject 60 mg into the skin every 6 (six) months. Administer in upper arm, thigh, or abdomen    . dexlansoprazole (DEXILANT) 60 MG capsule Take 60 mg by mouth daily.    Marland Kitchen diltiazem (CARDIZEM CD) 120 MG 24 hr capsule TAKE (1) CAPSULE DAILY. 90 capsule 0  . diphenhydramine-acetaminophen (TYLENOL PM) 25-500 MG TABS Take 1 tablet by mouth at bedtime.    . docusate sodium (COLACE) 100 MG capsule Take 100 mg by mouth at bedtime.    Marland Kitchen  DULoxetine (CYMBALTA) 20 MG capsule Take 20 mg by mouth daily.   2  . ezetimibe (ZETIA) 10 MG tablet Take 10 mg by mouth at bedtime.     Marland Kitchen HYDROcodone-acetaminophen (NORCO) 10-325 MG tablet Take 1 tablet by mouth every 6 (six) hours as needed for severe pain (for pain not managed by ultram). 30 tablet 0  . hydroxychloroquine (PLAQUENIL) 200 MG tablet Take 200 mg by mouth daily.  0  . hypromellose (SYSTANE OVERNIGHT THERAPY) 0.3 % GEL ophthalmic ointment Place 1 application into both eyes at bedtime.    Marland Kitchen levothyroxine (SYNTHROID, LEVOTHROID) 50 MCG tablet Take 50 mcg by mouth daily.    Marland Kitchen lovastatin (MEVACOR) 40 MG tablet Take 40 mg by mouth at bedtime.    . metoprolol tartrate (LOPRESSOR) 25 MG tablet Take 25 mg by mouth daily.    . Multiple Vitamin (MULTIVITAMIN WITH MINERALS) TABS tablet Take 1 tablet by mouth daily.    . nortriptyline  (PAMELOR) 25 MG capsule Take 25 mg by mouth at bedtime.   4  . Polyethyl Glycol-Propyl Glycol (SYSTANE) 0.4-0.3 % SOLN Place 1 drop into both eyes daily.     . Potassium Citrate-Citric Acid 3300-1002 MG PACK Take 1 packet by mouth daily. "Citra K" dissolve in 6 oz liquid and drink    . pramipexole (MIRAPEX) 1 MG tablet Take 1 tablet (1 mg total) by mouth 3 (three) times daily. 90 tablet 12  . Skin Protectants, Misc. (EUCERIN) cream Apply 1 application topically as needed (Applies under breast and arm for skin irritation.).    Marland Kitchen solifenacin (VESICARE) 10 MG tablet Take 10 mg by mouth daily.    . traMADol (ULTRAM) 50 MG tablet Take 50 mg by mouth See admin instructions. Take 1 tablet (50 mg) by mouth every morning with 200 mg ER tablet, may take an additional tablet (50 mg) two more times daily as needed for pain    . traMADol (ULTRAM-ER) 200 MG 24 hr tablet Take 200 mg by mouth See admin instructions. Take 1 tablet (200 mg ER) by mouth every morning with a 50 mg tablet    . trimethoprim (TRIMPEX) 100 MG tablet Take 100 mg by mouth daily.   2   No facility-administered medications prior to visit.    PAST MEDICAL HISTORY: Past Medical History  Diagnosis Date  . Hypothyroidism   . Shortness of breath   . H/O hiatal hernia   . Arthritis   . Anxiety   . Neuropathy ([place])   . Tremor   . Anemia   . Osteoporosis   . CHF (congestive heart failure) ([place])   . Parkinson disease ([place])   . Fibromyalgia   . Restless leg   . Sjogren's disease ([place])   . Hypertension     dr t turner  . GERD (gastroesophageal reflux disease)   . Hypercholesterolemia   . Diastolic dysfunction   . SOB (shortness of breath)     chronic due to diastolic dysfunction, deconditioning, obesity  . PVD (peripheral vascular disease) ([facility])     99% stenosis of left anteiror tibial artery, mod stenosis of left distal SFA and popliteal artery followed by [name]  . Chronic diastolic CHF (congestive heart failure) ([place])   .  Broken arm     right   . Family history of adverse reaction to anesthesia     pt. states sister vomits  . History of kidney stones   . Fracture of thumb 09/01/2015    right  .  Fracture, foot 09/01/2015    right  . History of bronchitis   . Heart murmur     PAST SURGICAL HISTORY: Past Surgical History  Procedure Laterality Date  . Abdominal hysterectomy    . Wrist fracture surgery Bilateral   . Falls      variious fall, broken wrist,and toes  . Total hip arthroplasty      right  . Cardiac catheterization  2006    normal  . Spine surgery  April 2013    Back X's 4  . Fracture surgery Right April 2016    Wrist, Pt. fell  . Eye surgery Bilateral     cateracts  . Back surgery      4 back surgeries,   lumbar fusion  . Spinal cord stimulator insertion N/A 09/09/2015    Procedure: LUMBAR SPINAL CORD STIMULATOR INSERTION;  Surgeon: Clydell Hakim, MD;  Location: [facility];  Service: Neurosurgery;  Laterality: N/A;  LUMBAR SPINAL CORD STIMULATOR INSERTION    FAMILY HISTORY: Family History  Problem Relation Age of Onset  . Heart attack Mother   . Restless legs syndrome Mother   . Heart failure Mother   . Heart disease Mother   . Hypertension Mother   . COPD Father     SOCIAL HISTORY:  Social History   Social History  . Marital Status: Married    Spouse Name: [name]  . Number of Children: 2  . Years of Education: HS   Occupational History  .      Homemaker   Social History Main Topics  . Smoking status: Never Smoker   . Smokeless tobacco: Never Used  . Alcohol Use: No  . Drug Use: No  . Sexual Activity: No   Other Topics Concern  . Not on file   Social History Narrative   Patient lives at home with her spouse.   Caffeine Use: tea     PHYSICAL EXAM  Filed Vitals:   10/13/15 1451  BP: 112/67  Pulse: 69  Height: 4\' 11"  (1.499 m)    Not recorded     MMSE - Mini Mental State Exam 10/13/2015  Orientation to time 5  Orientation to Place 4    Registration 3  Attention/ Calculation 4  Recall 2  Language- name 2 objects 2  Language- repeat 0  Language- follow 3 step command 3  Language- read & follow direction 1  Write a sentence 1  Copy design 0  Total score 25    There is no weight on file to calculate BMI.  GENERAL EXAM: General: Patient is awake, alert and in no acute distress.  Well developed and groomed.  MASKED FACIES. Neck: Neck is supple. Cardiovascular: Heart is regular rate and rhythm with no murmurs.  Neurologic Exam  Mental Status: Awake, alert.  Language is fluent and comprehension intact. Cranial Nerves: Pupils are equal and reactive to light.  Visual fields are full to confrontation.  Conjugate eye movements are full and symmetric.  Facial sensation and strength are symmetric.  Hearing is intact.  Palate elevated symmetrically and uvula is midline.  Shoulder shug is symmetric.  Tongue is midline. Motor: Normal bulk and tone; MILD COGWHEELING IN LUE  WITH REINFORCEMENT.  NO REST OR POSTURAL TREMOR.  BRADYKINESIA IN BUE AND BLE (L SLOWER THAN R). Full strength in the upper and lower extremities; EXCEPT LIMITED IN RIGHT ARM DUE TO RIGHT SHOULDER PAIN Sensory: Intact and symmetric to light touch. Coordination: No ataxia or dysmetria  on finger-nose or rapid alternating movement testing. Gait and Station: CAUTIOUS GAIT. SLOW TO RISE, AND NEEDS HELP TO STAND UP.  SLOW, DECR ARM SWING. SLOW TURNING. VERY UNSTEADY. Reflexes: Deep tendon reflexes in the upper extremity are present and symmetric; ABSENT AT KNEES AND ANKLES.   DIAGNOSTIC DATA (LABS, IMAGING, TESTING) - I reviewed patient records, labs, notes, testing and imaging myself where available.  Lab Results  Component Value Date   WBC 8.3 09/02/2015   HGB 10.7* 09/02/2015   HCT 33.8* 09/02/2015   MCV 92.3 09/02/2015   PLT 289 09/02/2015      Component Value Date/Time   NA 136 09/02/2015 1631   K 4.2 09/02/2015 1631   CL 104 09/02/2015 1631   CO2 22  09/02/2015 1631   GLUCOSE 101* 09/02/2015 1631   BUN 19 09/02/2015 1631   CREATININE 0.91 09/02/2015 1631   CALCIUM 9.3 09/02/2015 1631   PROT 6.5 05/22/2010 0923   ALBUMIN 3.9 05/22/2010 0923   AST 29 05/22/2010 0923   ALT 22 05/22/2010 0923   ALKPHOS 77 05/22/2010 0923   BILITOT 0.2* 05/22/2010 0923   GFRNONAA 58* 09/02/2015 1631   GFRAA >60 09/02/2015 1631   No results found for: CHOL No results found for: HGBA1C No results found for: VITAMINB12 No results found for: TSH   11/10/10 MRI brain - mild atrophy and minimal chronic small vessel ischemic disease.   ASSESSMENT AND PLAN  81 y.o. female with hypertension, hypercholesterolemia, hypothyroidism, fibromyalgia, restless leg syndrome and parkinson's disease.  More shoulder pain, memory loss and gait difficulty. Represents progression of parkinson's disease.  Dx: idiopathic parkinson's disease (akinetic/rigid) + RLS + memory loss (likely related to parkinson's disease)  PLAN: - continue pramipexole 1mg  TID - continue carb/levo to 2 tabs TID - continue using rolling walker at all times; increase supervision and assistance to avoid falls/injuries - consider donepezil, memantine or exelon for memory loss - stay active and continue stimulating activities  Return in about 4 months (around 02/10/2016).    [name], MD 43/12/5398, 8:67 PM Certified in Neurology, Neurophysiology and Neuroimaging  [facility] [address] [place], [place] [address] [phone]

## 2015-10-14 DIAGNOSIS — S92351D Displaced fracture of fifth metatarsal bone, right foot, subsequent encounter for fracture with routine healing: Secondary | ICD-10-CM | POA: Diagnosis not present

## 2015-10-19 DIAGNOSIS — M47816 Spondylosis without myelopathy or radiculopathy, lumbar region: Secondary | ICD-10-CM | POA: Diagnosis not present

## 2015-10-19 DIAGNOSIS — M545 Low back pain: Secondary | ICD-10-CM | POA: Diagnosis not present

## 2015-10-19 DIAGNOSIS — M961 Postlaminectomy syndrome, not elsewhere classified: Secondary | ICD-10-CM | POA: Diagnosis not present

## 2015-10-21 DIAGNOSIS — J385 Laryngeal spasm: Secondary | ICD-10-CM | POA: Diagnosis not present

## 2015-10-21 DIAGNOSIS — J029 Acute pharyngitis, unspecified: Secondary | ICD-10-CM | POA: Diagnosis not present

## 2015-10-26 ENCOUNTER — Ambulatory Visit (INDEPENDENT_AMBULATORY_CARE_PROVIDER_SITE_OTHER): Payer: Medicare Other | Admitting: Cardiology

## 2015-10-26 ENCOUNTER — Encounter: Payer: Self-pay | Admitting: Cardiology

## 2015-10-26 ENCOUNTER — Other Ambulatory Visit: Payer: Self-pay | Admitting: Cardiology

## 2015-10-26 DIAGNOSIS — I779 Disorder of arteries and arterioles, unspecified: Secondary | ICD-10-CM | POA: Diagnosis not present

## 2015-10-26 DIAGNOSIS — I10 Essential (primary) hypertension: Secondary | ICD-10-CM | POA: Diagnosis not present

## 2015-10-26 DIAGNOSIS — R0602 Shortness of breath: Secondary | ICD-10-CM

## 2015-10-26 DIAGNOSIS — I5032 Chronic diastolic (congestive) heart failure: Secondary | ICD-10-CM | POA: Diagnosis not present

## 2015-10-26 MED ORDER — FUROSEMIDE 20 MG PO TABS: 20.0000 mg | ORAL_TABLET | Freq: Every day | ORAL | Status: DC

## 2015-10-26 NOTE — Progress Notes (Addendum)
Cardiology Office Note   Date:  10/26/2015   ID:  [name], DOB 1931-04-28, MRN [id number]  PCP:  [name], MD    Chief Complaint  Patient presents with  . Hypertension  . Congestive Heart Failure      History of Present Illness: [name] is a 81 y.o. female with a history of chronic SOB secondary to diastolic dysfunction, chronic diastolic CHF, deconditioning and obesity. She has had chronic SOB for years and a heart cath in 2006 showed normal coronary arteries and normal LVF with normal LVEDP. She also has a history of HTN, chronic LE edema and PVD followed by [name]. She is doing well from a cardiac standpoint. She denies any chest pain, palpitations or syncope. She has chronic LE edema. She has chronic SOB which is stable and has not changed any since I saw her last.  She occasionally has some dizziness.  She has had several falls of unclear etiology but not from dizziness or syncope. Her husband suspects it is from the Parkinsons affecting her walking.    Past Medical History  Diagnosis Date  . Hypothyroidism   . Shortness of breath   . H/O hiatal hernia   . Arthritis   . Anxiety   . Neuropathy ([place])   . Tremor   . Anemia   . Osteoporosis   . CHF (congestive heart failure) ([place])   . Parkinson disease ([place])   . Fibromyalgia   . Restless leg   . Sjogren's disease ([place])   . Hypertension     dr t Fareeda Downard  . GERD (gastroesophageal reflux disease)   . Hypercholesterolemia   . Diastolic dysfunction   . SOB (shortness of breath)     chronic due to diastolic dysfunction, deconditioning, obesity  . PVD (peripheral vascular disease) ([facility])     99% stenosis of left anteiror tibial artery, mod stenosis of left distal SFA and popliteal artery followed by [name]  . Chronic diastolic CHF (congestive heart failure) ([place])   . Broken arm     right   . Family history of adverse reaction to anesthesia     pt. states sister  vomits  . History of kidney stones   . Fracture of thumb 09/01/2015    right  . Fracture, foot 09/01/2015    right  . History of bronchitis   . Heart murmur     Past Surgical History  Procedure Laterality Date  . Abdominal hysterectomy    . Wrist fracture surgery Bilateral   . Falls      variious fall, broken wrist,and toes  . Total hip arthroplasty      right  . Cardiac catheterization  2006    normal  . Spine surgery  April 2013    Back X's 4  . Fracture surgery Right April 2016    Wrist, Pt. fell  . Eye surgery Bilateral     cateracts  . Back surgery      4 back surgeries,   lumbar fusion  . Spinal cord stimulator insertion N/A 09/09/2015    Procedure: LUMBAR SPINAL CORD STIMULATOR INSERTION;  Surgeon: Clydell Hakim, MD;  Location: [facility];  Service: Neurosurgery;  Laterality: N/A;  LUMBAR SPINAL CORD STIMULATOR INSERTION     Current Outpatient Prescriptions  Medication Sig Dispense Refill  . albuterol (PROAIR HFA) 108 (90 BASE) MCG/ACT inhaler Inhale 2  puffs into the lungs every 4 (four) hours as needed for wheezing or shortness of breath.    Marland Kitchen aspirin EC 81 MG tablet Take 81 mg by mouth daily.    . Calcium Carb-Cholecalciferol (CALCIUM + D3 PO) Take by mouth. Calcium 1000 mg    . carbidopa-levodopa (SINEMET IR) 25-100 MG per tablet Take 2 tablets by mouth 3 (three) times daily. 180 tablet 12  . celecoxib (CELEBREX) 200 MG capsule Take 200 mg by mouth 2 (two) times daily.    . Cholecalciferol (VITAMIN D3) 2000 UNITS TABS Take 2,000 Units by mouth daily.     . Coenzyme Q10 100 MG TABS Take 100 mg by mouth daily.    Marland Kitchen conjugated estrogens (PREMARIN) vaginal cream Place 1 Applicatorful vaginally daily as needed (dryness).    Marland Kitchen dexlansoprazole (DEXILANT) 60 MG capsule Take 60 mg by mouth daily.    Marland Kitchen diltiazem (CARDIZEM SR) 120 MG 12 hr capsule Take 120 mg by mouth daily.    . diphenhydramine-acetaminophen (TYLENOL PM) 25-500 MG TABS Take 1 tablet by mouth at bedtime.      . docusate sodium (COLACE) 100 MG capsule Take 100 mg by mouth at bedtime.    . DULoxetine (CYMBALTA) 20 MG capsule Take 20 mg by mouth daily.   2  . ezetimibe (ZETIA) 10 MG tablet Take 10 mg by mouth at bedtime.     . furosemide (LASIX) 20 MG tablet Take 20 mg by mouth daily.    Marland Kitchen HYDROcodone-acetaminophen (NORCO) 10-325 MG tablet Take 1 tablet by mouth every 6 (six) hours as needed for severe pain (for pain not managed by ultram). 30 tablet 0  . hydroxychloroquine (PLAQUENIL) 200 MG tablet Take 200 mg by mouth daily.  0  . hypromellose (SYSTANE OVERNIGHT THERAPY) 0.3 % GEL ophthalmic ointment Place 1 application into both eyes at bedtime.    Marland Kitchen levothyroxine (SYNTHROID, LEVOTHROID) 50 MCG tablet Take 50 mcg by mouth daily.    Marland Kitchen lovastatin (MEVACOR) 40 MG tablet Take 40 mg by mouth at bedtime.    . metoprolol tartrate (LOPRESSOR) 25 MG tablet Take 25 mg by mouth daily.    . Multiple Vitamin (MULTIVITAMIN WITH MINERALS) TABS tablet Take 1 tablet by mouth daily.    . nortriptyline (PAMELOR) 25 MG capsule Take 25 mg by mouth at bedtime.   4  . Potassium Citrate-Citric Acid 3300-1002 MG PACK Take 1 packet by mouth daily. "Citra K" dissolve in 6 oz liquid and drink    . pramipexole (MIRAPEX) 1 MG tablet Take 1 tablet (1 mg total) by mouth 3 (three) times daily. 90 tablet 12  . Skin Protectants, Misc. (EUCERIN) cream Apply 1 application topically as needed (Applies under breast and arm for skin irritation.).    Marland Kitchen solifenacin (VESICARE) 10 MG tablet Take 10 mg by mouth daily.    . traMADol (ULTRAM) 50 MG tablet Take 50 mg by mouth See admin instructions. Take 1 tablet (50 mg) by mouth every morning with 200 mg ER tablet, may take an additional tablet (50 mg) two more times daily as needed for pain    . trimethoprim (TRIMPEX) 100 MG tablet Take 100 mg by mouth daily.   2  . VESICARE 5 MG tablet Take 5 mg by mouth daily.   10  . denosumab (PROLIA) 60 MG/ML SOLN injection Inject 60 mg into the skin every  6 (six) months. Administer in upper arm, thigh, or abdomen    . Polyethyl Glycol-Propyl Glycol (SYSTANE) 0.4-0.3 %  SOLN Place 1 drop into both eyes daily.     . traMADol (ULTRAM-ER) 200 MG 24 hr tablet Take 200 mg by mouth See admin instructions. Take 1 tablet (200 mg ER) by mouth every morning with a 50 mg tablet     No current facility-administered medications for this visit.    Allergies:   Codeine; Lactose intolerance (gi); Lyrica; Other; Plaquenil; Reglan; Requip; Septra; Shellfish allergy; and Latex    Social History:  The patient  reports that she has never smoked. She has never used smokeless tobacco. She reports that she does not drink alcohol or use illicit drugs.   Family History:  The patient's family history includes COPD in her father; Heart attack in her mother; Heart disease in her mother; Heart failure in her mother; Hypertension in her mother; Restless legs syndrome in her mother.    ROS:  Please see the history of present illness.   Otherwise, review of systems are positive for none.   All other systems are reviewed and negative.    PHYSICAL EXAM: VS:  BP 98/52 mmHg  Pulse 65  Ht 4' 10.5" (1.486 m)  Wt 72.576 kg (160 lb)  BMI 32.87 kg/m2 , BMI Body mass index is 32.87 kg/(m^2). GEN: Well nourished, well developed, in no acute distress HEENT: normal Neck: no JVD, carotid bruits, or masses Cardiac: RRR; no rubs, or gallops,no edema.  2/6 SM at RUSB Respiratory:  clear to auscultation bilaterally, normal work of breathing GI: soft, nontender, nondistended, + BS MS: no deformity or atrophy Skin: warm and dry, no rash Neuro:  Strength and sensation are intact Psych: euthymic mood, full affect   EKG:  EKG is ordered today. The ekg ordered today demonstrates NSR at 65bpm with no ST change   Recent Labs: 09/02/2015: BUN 19; Creatinine, Ser 0.91; Hemoglobin 10.7*; Platelets 289; Potassium 4.2; Sodium 136    Lipid Panel No results found for: CHOL, TRIG, HDL,  CHOLHDL, VLDL, LDLCALC, LDLDIRECT    Wt Readings from Last 3 Encounters:  10/26/15 72.576 kg (160 lb)  09/09/15 73.483 kg (162 lb)  09/02/15 73.483 kg (162 lb)    ASSESSMENT AND PLAN:  1. Chronic diastolic CHF - she is still chronically SOB but has not been taking her lasix daily.  She has no evidence of volume overload on exam.   - continue Lasix/beta blocker - I will refill her prescription for Lasix. 2. Chronic SOB multifactorial from obesity, deconditioning and diastolic dysfunction. Nuclear stress test 05/2013 with no ischemia. She has had chronic SOB dating back to 2006 at the time of her cath that showed normal coronary arteries, normal LVF and normal LVEDP.  3. HTN - controlled but on the soft side.   - continue Metoprolol  - stop Cardizem - I have asked her to check her BP daily for a week and call with the results   Current medicines are reviewed at length with the patient today.  The patient does not have concerns regarding medicines.  The following changes have been made:  no change  Labs/ tests ordered today: See above Assessment and Plan No orders of the defined types were placed in this encounter.     Disposition:   FU with me in 6 months  Signed, [name], MD  10/26/2015 10:58 AM    [facility] [place], [place], [place]  [address] Phone: [phone]; Fax: [phone]

## 2015-10-26 NOTE — Addendum Note (Signed)
Addended by: Harland German A on: 10/26/2015 01:21 PM   Modules accepted: Medications

## 2015-10-26 NOTE — Addendum Note (Signed)
Addended by: Fransico Him R on: 10/26/2015 11:29 AM   Modules accepted: Miquel Dunn

## 2015-10-26 NOTE — Patient Instructions (Signed)
Medication Instructions:  Your physician has recommended you make the following change in your medication: 1) STOP CARDIZEM (DILTIAZEM)  Labwork: None  Testing/Procedures: None  Follow-Up: Your physician wants you to follow-up in: 6 months with Dr. Radford Pax. You will receive a reminder letter in the mail two months in advance. If you don't receive a letter, please call our office to schedule the follow-up appointment.   Any Other Special Instructions Will Be Listed Below (If Applicable). Please check your BLOOD PRESSURE daily for a week and call with results.    If you need a refill on your cardiac medications before your next appointment, please call your pharmacy.

## 2015-10-26 NOTE — Addendum Note (Signed)
Addended by: Fransico Him R on: 10/26/2015 11:08 AM   Modules accepted: SmartSet

## 2015-10-27 ENCOUNTER — Other Ambulatory Visit: Payer: Self-pay | Admitting: Cardiology

## 2015-11-01 DIAGNOSIS — M47816 Spondylosis without myelopathy or radiculopathy, lumbar region: Secondary | ICD-10-CM | POA: Diagnosis not present

## 2015-11-01 DIAGNOSIS — M961 Postlaminectomy syndrome, not elsewhere classified: Secondary | ICD-10-CM | POA: Diagnosis not present

## 2015-11-01 DIAGNOSIS — M545 Low back pain: Secondary | ICD-10-CM | POA: Diagnosis not present

## 2015-11-01 DIAGNOSIS — Z9689 Presence of other specified functional implants: Secondary | ICD-10-CM | POA: Diagnosis not present

## 2015-11-07 ENCOUNTER — Telehealth: Payer: Self-pay | Admitting: Cardiology

## 2015-11-07 NOTE — Telephone Encounter (Signed)
Confirmed with patient she stopped cardizem and is still taking metoprolol.

## 2015-11-07 NOTE — Telephone Encounter (Signed)
BP is stable - Is patient still taking metoprolol

## 2015-11-07 NOTE — Telephone Encounter (Signed)
New message      Pt c/o BP issue: STAT if pt c/o blurred vision, one-sided weakness or slurred speech  1. What are your last 5 BP readings? Nov 17 110/63; nov 18 128/66, nov 19 109/65, nov 20 121/66; nov 21 120/67; nov 22 134/80; nov 23 135/78---pt had dental procedure under anesthesia and bp was  9am 112/95,10am 120/65, 10:30 121/63; nov 27 126/75 2. Are you having any other symptoms (ex. Dizziness, headache, blurred vision, passed out)?  no 3. What is your BP issue? Pt is off bp medication----calling to give bp readings

## 2015-11-08 ENCOUNTER — Other Ambulatory Visit: Payer: Self-pay | Admitting: Family Medicine

## 2015-11-08 DIAGNOSIS — R1900 Intra-abdominal and pelvic swelling, mass and lump, unspecified site: Secondary | ICD-10-CM | POA: Diagnosis not present

## 2015-11-10 DIAGNOSIS — R8290 Unspecified abnormal findings in urine: Secondary | ICD-10-CM | POA: Diagnosis not present

## 2015-11-10 DIAGNOSIS — N3010 Interstitial cystitis (chronic) without hematuria: Secondary | ICD-10-CM | POA: Diagnosis not present

## 2015-11-10 DIAGNOSIS — Z87442 Personal history of urinary calculi: Secondary | ICD-10-CM | POA: Diagnosis not present

## 2015-11-10 DIAGNOSIS — N390 Urinary tract infection, site not specified: Secondary | ICD-10-CM | POA: Diagnosis not present

## 2015-11-11 DIAGNOSIS — S92351D Displaced fracture of fifth metatarsal bone, right foot, subsequent encounter for fracture with routine healing: Secondary | ICD-10-CM | POA: Diagnosis not present

## 2015-11-16 ENCOUNTER — Other Ambulatory Visit: Payer: Self-pay

## 2015-11-16 ENCOUNTER — Ambulatory Visit
Admission: RE | Admit: 2015-11-16 | Discharge: 2015-11-16 | Disposition: A | Discharge status: Home / Self Care | Payer: Medicare Other | Source: Ambulatory Visit | Attending: Family Medicine | Admitting: Family Medicine

## 2015-11-16 DIAGNOSIS — R1900 Intra-abdominal and pelvic swelling, mass and lump, unspecified site: Secondary | ICD-10-CM

## 2015-11-16 DIAGNOSIS — R1907 Generalized intra-abdominal and pelvic swelling, mass and lump: Secondary | ICD-10-CM | POA: Diagnosis not present

## 2015-11-29 ENCOUNTER — Ambulatory Visit: Payer: BLUE CROSS/BLUE SHIELD | Admitting: Diagnostic Neuroimaging

## 2015-11-30 DIAGNOSIS — S92351D Displaced fracture of fifth metatarsal bone, right foot, subsequent encounter for fracture with routine healing: Secondary | ICD-10-CM | POA: Diagnosis not present

## 2015-12-15 DIAGNOSIS — M545 Low back pain: Secondary | ICD-10-CM | POA: Diagnosis not present

## 2015-12-15 DIAGNOSIS — M3500 Sicca syndrome, unspecified: Secondary | ICD-10-CM | POA: Diagnosis not present

## 2015-12-15 DIAGNOSIS — M154 Erosive (osteo)arthritis: Secondary | ICD-10-CM | POA: Diagnosis not present

## 2015-12-15 DIAGNOSIS — Z79899 Other long term (current) drug therapy: Secondary | ICD-10-CM | POA: Diagnosis not present

## 2015-12-15 DIAGNOSIS — M79642 Pain in left hand: Secondary | ICD-10-CM | POA: Diagnosis not present

## 2015-12-15 DIAGNOSIS — M79641 Pain in right hand: Secondary | ICD-10-CM | POA: Diagnosis not present

## 2015-12-21 DIAGNOSIS — E039 Hypothyroidism, unspecified: Secondary | ICD-10-CM | POA: Diagnosis not present

## 2015-12-21 DIAGNOSIS — I10 Essential (primary) hypertension: Secondary | ICD-10-CM | POA: Diagnosis not present

## 2015-12-21 DIAGNOSIS — R600 Localized edema: Secondary | ICD-10-CM | POA: Diagnosis not present

## 2015-12-21 DIAGNOSIS — E782 Mixed hyperlipidemia: Secondary | ICD-10-CM | POA: Diagnosis not present

## 2015-12-30 DIAGNOSIS — M2021 Hallux rigidus, right foot: Secondary | ICD-10-CM | POA: Diagnosis not present

## 2015-12-30 DIAGNOSIS — M2022 Hallux rigidus, left foot: Secondary | ICD-10-CM | POA: Diagnosis not present

## 2016-01-04 DIAGNOSIS — J069 Acute upper respiratory infection, unspecified: Secondary | ICD-10-CM | POA: Diagnosis not present

## 2016-01-09 ENCOUNTER — Ambulatory Visit: Payer: BLUE CROSS/BLUE SHIELD | Admitting: Diagnostic Neuroimaging

## 2016-01-16 DIAGNOSIS — R21 Rash and other nonspecific skin eruption: Secondary | ICD-10-CM | POA: Diagnosis not present

## 2016-01-16 DIAGNOSIS — H903 Sensorineural hearing loss, bilateral: Secondary | ICD-10-CM | POA: Diagnosis not present

## 2016-01-19 DIAGNOSIS — H35371 Puckering of macula, right eye: Secondary | ICD-10-CM | POA: Diagnosis not present

## 2016-01-19 DIAGNOSIS — Z961 Presence of intraocular lens: Secondary | ICD-10-CM | POA: Diagnosis not present

## 2016-01-19 DIAGNOSIS — H04123 Dry eye syndrome of bilateral lacrimal glands: Secondary | ICD-10-CM | POA: Diagnosis not present

## 2016-02-06 DIAGNOSIS — R21 Rash and other nonspecific skin eruption: Secondary | ICD-10-CM | POA: Diagnosis not present

## 2016-02-16 ENCOUNTER — Ambulatory Visit (INDEPENDENT_AMBULATORY_CARE_PROVIDER_SITE_OTHER): Payer: Medicare Other | Admitting: Diagnostic Neuroimaging

## 2016-02-16 ENCOUNTER — Encounter: Payer: Self-pay | Admitting: Diagnostic Neuroimaging

## 2016-02-16 VITALS — BP 100/54 | HR 72 | Ht 59.0 in | Wt 159.0 lb

## 2016-02-16 DIAGNOSIS — R413 Other amnesia: Secondary | ICD-10-CM | POA: Diagnosis not present

## 2016-02-16 DIAGNOSIS — M3509 Sicca syndrome with other organ involvement: Secondary | ICD-10-CM | POA: Diagnosis not present

## 2016-02-16 DIAGNOSIS — R269 Unspecified abnormalities of gait and mobility: Secondary | ICD-10-CM

## 2016-02-16 DIAGNOSIS — G63 Polyneuropathy in diseases classified elsewhere: Secondary | ICD-10-CM

## 2016-02-16 DIAGNOSIS — G20 Parkinson's disease: Secondary | ICD-10-CM | POA: Diagnosis not present

## 2016-02-16 DIAGNOSIS — G609 Hereditary and idiopathic neuropathy, unspecified: Secondary | ICD-10-CM | POA: Diagnosis not present

## 2016-02-16 MED ORDER — CARBIDOPA-LEVODOPA 25-100 MG PO TABS: 2.0000 | ORAL_TABLET | Freq: Three times a day (TID) | ORAL | Status: DC

## 2016-02-16 MED ORDER — PRAMIPEXOLE DIHYDROCHLORIDE 1 MG PO TABS: 1.0000 mg | ORAL_TABLET | Freq: Three times a day (TID) | ORAL | Status: DC

## 2016-02-16 NOTE — Addendum Note (Signed)
Addended by: Minna Antis on: 02/16/2016 02:19 PM   Modules accepted: Medications

## 2016-02-16 NOTE — Progress Notes (Signed)
[facility]  PATIENT: [name] DOB: September 02, 1931  REFERRING CLINICIAN:  HISTORY FROM: patient and husband REASON FOR VISIT: follow up   HISTORICAL  CHIEF COMPLAINT:  Chief Complaint  Patient presents with  . Parkinson's disease    rm 7, husband- John, "doing well, very little shaking"  . Follow-up    4 month    HISTORY OF PRESENT ILLNESS:   UPDATE 02/16/16: Since last visit, doing well. Memory loss stable. Having some pain in feet. Now has spinal cord stim for back pain issues.   UPDATE 10/13/15: Since last visit, more memory loss problems. Forgetting order when playing bridge. Forgetting recent conversations and names. Had fall in Aug 2016, with right foot fracture.   UPDATE 07/06/15: More issues with kidney stones, UTIs, shoulder pain. No wearing off. No tremor. More balance and freezing issues. Short term memory problems continue. Patient is in denial about need to use walker and her own memory problems.   UPDATE 03/02/15: Since last visit, patient's balance has worsened. She fell recently (02/26/15), with right non-displaced distal right radial fracture. Notes more tremor and wearing off around 4pm everyday. Currently on carb/levo 1.5tab TID and pramipexole 1mg  TID. Sometimes takes a 4th dose of carb/levo 1.5tabs. Had been using a walker, mainly at night time, but did not use at time of fall.  UPDATE 09/03/14: Since last visit, tremor is well controlled. Gait and freezing are stable,. Doing well on (carb/levo 1.5 tabs + pramipexole 1mg ) taken TID (sometimes QID, with extra late evening dose if wearing off is notable). Having more foot pain, numbness, drooling prob, flushing, anxiety. Saw psychology last visit, then rec to see psychiatry, but sxs improved and the psychiatrist who was recommended to them was not taking new patients.   UPDATE 02/24/14: Since last visit, has increased carb/levo up to 1 tab 4x per day; has also incr pramipexole up to 1mg  4x  per day (for convenience of remembering, even though we normally dose BID or TID). Also with more anxiety, decr confidence, more tremor in right upper ext. Also reports some right shoulder, bilateral knee, low back pain. Has noticed some freezing of gait. Has appt with psychology later today.  UPDATE 08/26/13: Since last visit, noticing some wearing off in early evening. Takes carb/levo 6:30am, noon, 9pm. Also getting more "worked up" lately, reading about PD online. No falls. Uses cane and walker. More fatigue and mental exhaustion.  UPDATE 02/04/13: Since last visit, has had some minor falls. tremor and PD sxs better on carb/levo. No anxiety. Some drooling and hypophonia. Not using walker or cane all the time. constipation stable.  UPDATE 10/06/12: Since last visit, had another back surgery (april 2013), dx'd with CHF, dx'd with sjogrens, also with fall in August 2013 with pelvic fracture. Reports good tremor control. Some freezing of gait.  UPDATE 02/13/12: Now on pramipexole 1mg  QID (7am, noon, 5pm, 10pm). Some wearing off around 5pm. Also, had rxn to forte[facility], now with right leg pain. Also getting lumbar radiculopathy eval per ortho.  UPDATE 08/14/11: Tremor developing around 4pm now.  More anxiety per husband.  Also with UTI x 2.  Now dx'd with left foot fractures (? "stubbed" toes; no falls).  UPDATE 04/27/11: Tolerating pramipexole 1mg  TID (7am, 1-2pm, 7p).  some wearing off right at 7p.  feels unsteady, and has ongoing back problems and pain.  UPDATE 01/24/11: Doing slightly better with pramipexole 1mg  BID.  Tremor returns later in the day before evening dose.  Still with gait and balance diff.    PRIOR HPI: 82 year old female with history of hypertension, hypercholesterolemia, hypothyroidism, fibromyalgia, restless leg syndrome, presenting for evaluation of tremor for the past 3 years. She is here with her husband. Approximately 3 years ago patient developed intermittent tremor in her left hand  particularly in her thumb and fingers.  Tremor is most noticeable at rest and in the evening.  In June 2011, patient tripped, fell and broke her left wrist and forearm.  This was repaired surgically and in a cast; during this time and since then her tremor has improved. Patient also has history of restless leg syndrome for the past 5 years. She's been on pramipexole for number of years (0.5mg  qhs) and recently increased to 1 mg qhs a few weeks ago.  The patient's mother also had restless leg syndrome. Patient has a diagnosis of iron deficiency anemia but is not on iron replacement.   REVIEW OF SYSTEMS: Full 14 system review of systems performed and negative except for trouble swallowing fatigue appetite change walking diff rash constipation shortness double vision.    ALLERGIES: Allergies  Allergen Reactions  . Codeine Other (See Comments)    Headache Severe nightmares  "will not take it"  . Lactose Intolerance (Gi) Nausea And Vomiting and Other (See Comments)    Reactions to meds that contain lactose derivitives and foods  . Lyrica [Pregabalin] Other (See Comments)    Leg edema   . Other Other (See Comments)    Pain meds received after surgery caused nightmares (Percocet,Hydrocodone,Morphine)  . Plaquenil [Hydroxychloroquine Sulfate] Other (See Comments)    Stomach upset  . Reglan [Metoclopramide] Other (See Comments)    Stomach upset  . Requip [Ropinirole Hcl] Other (See Comments)    Stomach upset   . Septra [Sulfamethoxazole-Trimethoprim] Nausea And Vomiting  . Shellfish Allergy Nausea And Vomiting    Upset stomach  . Latex Rash    Redness and sore (steri strips under a cast caused a rash spring 2016)    HOME MEDICATIONS: Outpatient Prescriptions Prior to Visit  Medication Sig Dispense Refill  . albuterol (PROAIR HFA) 108 (90 BASE) MCG/ACT inhaler Inhale 2 puffs into the lungs every 4 (four) hours as needed for wheezing or shortness of breath.    Marland Kitchen aspirin EC 81 MG tablet  Take 81 mg by mouth daily.    . Calcium Carb-Cholecalciferol (CALCIUM + D3 PO) Take by mouth. Calcium 1000 mg    . carbidopa-levodopa (SINEMET IR) 25-100 MG per tablet Take 2 tablets by mouth 3 (three) times daily. 180 tablet 12  . celecoxib (CELEBREX) 200 MG capsule Take 200 mg by mouth 2 (two) times daily.    . Cholecalciferol (VITAMIN D3) 2000 UNITS TABS Take 2,000 Units by mouth daily.     . Coenzyme Q10 100 MG TABS Take 100 mg by mouth daily.    Marland Kitchen conjugated estrogens (PREMARIN) vaginal cream Place 1 Applicatorful vaginally daily as needed (dryness).    Marland Kitchen denosumab (PROLIA) 60 MG/ML SOLN injection Inject 60 mg into the skin every 6 (six) months. Administer in upper arm, thigh, or abdomen    . dexlansoprazole (DEXILANT) 60 MG capsule Take 60 mg by mouth daily.    . diphenhydramine-acetaminophen (TYLENOL PM) 25-500 MG TABS Take 1 tablet by mouth at bedtime.    . docusate sodium (COLACE) 100 MG capsule Take 100 mg by mouth at bedtime.    . DULoxetine (CYMBALTA) 20 MG capsule Take 20 mg by mouth daily.  2  . ezetimibe (ZETIA) 10 MG tablet Take 10 mg by mouth at bedtime.     . furosemide (LASIX) 20 MG tablet Take 1 tablet (20 mg total) by mouth daily. 90 tablet 3  . hydroxychloroquine (PLAQUENIL) 200 MG tablet Take 200 mg by mouth daily.  0  . hypromellose (SYSTANE OVERNIGHT THERAPY) 0.3 % GEL ophthalmic ointment Place 1 application into both eyes at bedtime.    Marland Kitchen levothyroxine (SYNTHROID, LEVOTHROID) 50 MCG tablet Take 50 mcg by mouth daily.    Marland Kitchen lovastatin (MEVACOR) 40 MG tablet Take 40 mg by mouth at bedtime.    . metoprolol tartrate (LOPRESSOR) 25 MG tablet Take 1 tablet (25 mg total) by mouth daily. 90 tablet 3  . Multiple Vitamin (MULTIVITAMIN WITH MINERALS) TABS tablet Take 1 tablet by mouth daily.    Vladimir Faster Glycol-Propyl Glycol (SYSTANE) 0.4-0.3 % SOLN Place 1 drop into both eyes daily.     . potassium chloride (K-DUR) 10 MEQ tablet TAKE 1 TABLET ONCE DAILY. 90 tablet 0  .  potassium citrate (UROCIT-K) 10 MEQ (1080 MG) SR tablet Take 10 mEq by mouth daily.    . pramipexole (MIRAPEX) 1 MG tablet Take 1 tablet (1 mg total) by mouth 3 (three) times daily. 90 tablet 12  . Skin Protectants, Misc. (EUCERIN) cream Apply 1 application topically as needed (Applies under breast and arm for skin irritation.).    Marland Kitchen solifenacin (VESICARE) 10 MG tablet Take 10 mg by mouth daily.    . traMADol (ULTRAM) 50 MG tablet Take 50 mg by mouth See admin instructions. Take 1 tablet (50 mg) by mouth every morning with 200 mg ER tablet, may take an additional tablet (50 mg) two more times daily as needed for pain    . traMADol (ULTRAM-ER) 200 MG 24 hr tablet Take 200 mg by mouth See admin instructions. Take 1 tablet (200 mg ER) by mouth every morning with a 50 mg tablet    . VESICARE 5 MG tablet Take 5 mg by mouth daily.   10  . trimethoprim (TRIMPEX) 100 MG tablet Take 100 mg by mouth daily.   2  . HYDROcodone-acetaminophen (NORCO) 10-325 MG tablet Take 1 tablet by mouth every 6 (six) hours as needed for severe pain (for pain not managed by ultram). 30 tablet 0  . nortriptyline (PAMELOR) 25 MG capsule Take 25 mg by mouth at bedtime.   4   No facility-administered medications prior to visit.    PAST MEDICAL HISTORY: Past Medical History  Diagnosis Date  . Hypothyroidism   . Shortness of breath   . H/O hiatal hernia   . Arthritis   . Anxiety   . Neuropathy ([place])   . Tremor   . Anemia   . Osteoporosis   . CHF (congestive heart failure) ([place])   . Parkinson disease ([place])   . Fibromyalgia   . Restless leg   . Sjogren's disease ([place])   . Hypertension     dr t turner  . GERD (gastroesophageal reflux disease)   . Hypercholesterolemia   . Diastolic dysfunction   . SOB (shortness of breath)     chronic due to diastolic dysfunction, deconditioning, obesity  . PVD (peripheral vascular disease) ([facility])     99% stenosis of left anteiror tibial artery, mod stenosis of left distal SFA and  popliteal artery followed by [name]  . Chronic diastolic CHF (congestive heart failure) ([place])   . Broken arm     right   .  Family history of adverse reaction to anesthesia     pt. states sister vomits  . History of kidney stones   . Fracture of thumb 09/01/2015    right  . Fracture, foot 09/01/2015    right  . History of bronchitis   . Heart murmur     PAST SURGICAL HISTORY: Past Surgical History  Procedure Laterality Date  . Abdominal hysterectomy    . Wrist fracture surgery Bilateral   . Falls      variious fall, broken wrist,and toes  . Total hip arthroplasty      right  . Cardiac catheterization  2006    normal  . Spine surgery  April 2013    Back X's 4  . Fracture surgery Right April 2016    Wrist, Pt. fell  . Eye surgery Bilateral     cateracts  . Back surgery      4 back surgeries,   lumbar fusion  . Spinal cord stimulator insertion N/A 09/09/2015    Procedure: LUMBAR SPINAL CORD STIMULATOR INSERTION;  Surgeon: Clydell Hakim, MD;  Location: [facility];  Service: Neurosurgery;  Laterality: N/A;  LUMBAR SPINAL CORD STIMULATOR INSERTION    FAMILY HISTORY: Family History  Problem Relation Age of Onset  . Heart attack Mother   . Restless legs syndrome Mother   . Heart failure Mother   . Heart disease Mother   . Hypertension Mother   . COPD Father     SOCIAL HISTORY:  Social History   Social History  . Marital Status: Married    Spouse Name: [name]  . Number of Children: 2  . Years of Education: HS   Occupational History  .      Homemaker   Social History Main Topics  . Smoking status: Never Smoker   . Smokeless tobacco: Never Used  . Alcohol Use: No  . Drug Use: No  . Sexual Activity: No   Other Topics Concern  . Not on file   Social History Narrative   Patient lives at home with her spouse.   Caffeine Use: tea     PHYSICAL EXAM  Filed Vitals:   02/16/16 1307  BP: 100/54  Pulse: 72  Height: 4\' 11"  (1.499 m)  Weight: 159  lb (72.122 kg)    Not recorded     MMSE - Mini Mental State Exam 10/13/2015  Orientation to time 5  Orientation to Place 4  Registration 3  Attention/ Calculation 4  Recall 2  Language- name 2 objects 2  Language- repeat 0  Language- follow 3 step command 3  Language- read & follow direction 1  Write a sentence 1  Copy design 0  Total score 25    Body mass index is 32.1 kg/(m^2).  GENERAL EXAM: General: Patient is awake, alert and in no acute distress.  Well developed and groomed.  MASKED FACIES. Neck: Neck is supple. Cardiovascular: Heart is regular rate and rhythm with no murmurs.  Neurologic Exam  Mental Status: Awake, alert.  Language is fluent and comprehension intact. Cranial Nerves: Pupils are equal and reactive to light.  Visual fields are full to confrontation.  Conjugate eye movements are full and symmetric.  Facial sensation and strength are symmetric.  Hearing is intact.  Palate elevated symmetrically and uvula is midline.  Shoulder shug is symmetric.  Tongue is midline. Motor: Normal bulk and tone; MILD COGWHEELING IN LUE  WITH REINFORCEMENT.  NO REST OR POSTURAL TREMOR.  BRADYKINESIA IN BUE  AND BLE (L SLOWER THAN R). Full strength in the upper and lower extremities; EXCEPT LIMITED IN RIGHT ARM DUE TO RIGHT SHOULDER PAIN Sensory: Intact and symmetric to light touch. Coordination: No ataxia or dysmetria on finger-nose or rapid alternating movement testing. Reflexes: Deep tendon reflexes in the upper extremity are present and symmetric; ABSENT AT KNEES AND ANKLES. Gait and Station: CAUTIOUS GAIT. SLOW TO RISE, AND NEEDS HELP TO STAND UP.  SLOW, DECR ARM SWING. SLOW TURNING. VERY UNSTEADY.    DIAGNOSTIC DATA (LABS, IMAGING, TESTING) - I reviewed patient records, labs, notes, testing and imaging myself where available.  Lab Results  Component Value Date   WBC 8.3 09/02/2015   HGB 10.7* 09/02/2015   HCT 33.8* 09/02/2015   MCV 92.3 09/02/2015   PLT 289 09/02/2015        Component Value Date/Time   NA 136 09/02/2015 1631   K 4.2 09/02/2015 1631   CL 104 09/02/2015 1631   CO2 22 09/02/2015 1631   GLUCOSE 101* 09/02/2015 1631   BUN 19 09/02/2015 1631   CREATININE 0.91 09/02/2015 1631   CALCIUM 9.3 09/02/2015 1631   PROT 6.5 05/22/2010 0923   ALBUMIN 3.9 05/22/2010 0923   AST 29 05/22/2010 0923   ALT 22 05/22/2010 0923   ALKPHOS 77 05/22/2010 0923   BILITOT 0.2* 05/22/2010 0923   GFRNONAA 58* 09/02/2015 1631   GFRAA >60 09/02/2015 1631   No results found for: CHOL No results found for: HGBA1C No results found for: VITAMINB12 No results found for: TSH   11/10/10 MRI brain - mild atrophy and minimal chronic small vessel ischemic disease.   ASSESSMENT AND PLAN  82 y.o. female with hypertension, hypercholesterolemia, hypothyroidism, fibromyalgia, restless leg syndrome and parkinson's disease.  More shoulder pain, foot pain, memory loss and gait difficulty. Represents progression of parkinson's disease.  Dx: idiopathic parkinson's disease (akinetic/rigid) + RLS + memory loss (likely related to parkinson's disease)  PLAN: - continue pramipexole 1mg  TID - continue carb/levo to 2 tabs TID - continue using rolling walker at all times; increase supervision and assistance to avoid falls/injuries - try lidocaine topical cream for foot pain - stay active and continue stimulating activities  Meds ordered this encounter  Medications  . carbidopa-levodopa (SINEMET IR) 25-100 MG tablet    Sig: Take 2 tablets by mouth 3 (three) times daily.    Dispense:  180 tablet    Refill:  12  . pramipexole (MIRAPEX) 1 MG tablet    Sig: Take 1 tablet (1 mg total) by mouth 3 (three) times daily.    Dispense:  90 tablet    Refill:  12   Return in about 6 months (around 08/18/2016).    [name], MD [id number], [id number] PM Certified in Neurology, Neurophysiology and Neuroimaging  [facility] [address], [place], [place]  [address] [phone]

## 2016-02-16 NOTE — Patient Instructions (Signed)
-   continue pramipexole and carb/levo - try topical lidocaine cream

## 2016-02-16 NOTE — Addendum Note (Signed)
Addended byAndrey Spearman on: 02/16/2016 01:51 PM   Modules accepted: Orders

## 2016-02-24 ENCOUNTER — Telehealth: Payer: Self-pay | Admitting: Diagnostic Neuroimaging

## 2016-02-24 NOTE — Telephone Encounter (Signed)
Tywan with [facility] is calling regarding an order received for Physical Therapy for the patient. The order was received but a doctors signature is needed. Please fax to [phone]. Thank you.

## 2016-02-27 NOTE — Telephone Encounter (Signed)
Noted order has be signed and faxed to Tyrone at [facility].

## 2016-03-13 DIAGNOSIS — R29898 Other symptoms and signs involving the musculoskeletal system: Secondary | ICD-10-CM | POA: Diagnosis not present

## 2016-03-13 DIAGNOSIS — M6281 Muscle weakness (generalized): Secondary | ICD-10-CM | POA: Diagnosis not present

## 2016-03-13 DIAGNOSIS — G20 Parkinson's disease: Secondary | ICD-10-CM | POA: Diagnosis not present

## 2016-03-13 DIAGNOSIS — M25511 Pain in right shoulder: Secondary | ICD-10-CM | POA: Diagnosis not present

## 2016-03-13 DIAGNOSIS — R278 Other lack of coordination: Secondary | ICD-10-CM | POA: Diagnosis not present

## 2016-03-13 DIAGNOSIS — M25531 Pain in right wrist: Secondary | ICD-10-CM | POA: Diagnosis not present

## 2016-03-13 DIAGNOSIS — R2681 Unsteadiness on feet: Secondary | ICD-10-CM | POA: Diagnosis not present

## 2016-03-13 DIAGNOSIS — M25512 Pain in left shoulder: Secondary | ICD-10-CM | POA: Diagnosis not present

## 2016-03-13 DIAGNOSIS — M79601 Pain in right arm: Secondary | ICD-10-CM | POA: Diagnosis not present

## 2016-03-16 DIAGNOSIS — M810 Age-related osteoporosis without current pathological fracture: Secondary | ICD-10-CM | POA: Diagnosis not present

## 2016-03-19 ENCOUNTER — Other Ambulatory Visit: Payer: Self-pay | Admitting: Diagnostic Neuroimaging

## 2016-03-27 DIAGNOSIS — R2681 Unsteadiness on feet: Secondary | ICD-10-CM | POA: Diagnosis not present

## 2016-03-27 DIAGNOSIS — M6281 Muscle weakness (generalized): Secondary | ICD-10-CM | POA: Diagnosis not present

## 2016-03-27 DIAGNOSIS — G20 Parkinson's disease: Secondary | ICD-10-CM | POA: Diagnosis not present

## 2016-03-27 DIAGNOSIS — M25512 Pain in left shoulder: Secondary | ICD-10-CM | POA: Diagnosis not present

## 2016-03-27 DIAGNOSIS — M25511 Pain in right shoulder: Secondary | ICD-10-CM | POA: Diagnosis not present

## 2016-03-27 DIAGNOSIS — R278 Other lack of coordination: Secondary | ICD-10-CM | POA: Diagnosis not present

## 2016-04-02 DIAGNOSIS — D235 Other benign neoplasm of skin of trunk: Secondary | ICD-10-CM | POA: Diagnosis not present

## 2016-04-02 DIAGNOSIS — D1801 Hemangioma of skin and subcutaneous tissue: Secondary | ICD-10-CM | POA: Diagnosis not present

## 2016-04-02 DIAGNOSIS — L304 Erythema intertrigo: Secondary | ICD-10-CM | POA: Diagnosis not present

## 2016-04-03 DIAGNOSIS — R2681 Unsteadiness on feet: Secondary | ICD-10-CM | POA: Diagnosis not present

## 2016-04-03 DIAGNOSIS — M6281 Muscle weakness (generalized): Secondary | ICD-10-CM | POA: Diagnosis not present

## 2016-04-03 DIAGNOSIS — M25512 Pain in left shoulder: Secondary | ICD-10-CM | POA: Diagnosis not present

## 2016-04-03 DIAGNOSIS — R278 Other lack of coordination: Secondary | ICD-10-CM | POA: Diagnosis not present

## 2016-04-03 DIAGNOSIS — M25511 Pain in right shoulder: Secondary | ICD-10-CM | POA: Diagnosis not present

## 2016-04-03 DIAGNOSIS — G20 Parkinson's disease: Secondary | ICD-10-CM | POA: Diagnosis not present

## 2016-04-06 DIAGNOSIS — M6281 Muscle weakness (generalized): Secondary | ICD-10-CM | POA: Diagnosis not present

## 2016-04-06 DIAGNOSIS — R2681 Unsteadiness on feet: Secondary | ICD-10-CM | POA: Diagnosis not present

## 2016-04-06 DIAGNOSIS — G20 Parkinson's disease: Secondary | ICD-10-CM | POA: Diagnosis not present

## 2016-04-06 DIAGNOSIS — M25512 Pain in left shoulder: Secondary | ICD-10-CM | POA: Diagnosis not present

## 2016-04-06 DIAGNOSIS — R278 Other lack of coordination: Secondary | ICD-10-CM | POA: Diagnosis not present

## 2016-04-06 DIAGNOSIS — M25511 Pain in right shoulder: Secondary | ICD-10-CM | POA: Diagnosis not present

## 2016-04-10 DIAGNOSIS — G20 Parkinson's disease: Secondary | ICD-10-CM | POA: Diagnosis not present

## 2016-04-10 DIAGNOSIS — R2681 Unsteadiness on feet: Secondary | ICD-10-CM | POA: Diagnosis not present

## 2016-04-10 DIAGNOSIS — M25512 Pain in left shoulder: Secondary | ICD-10-CM | POA: Diagnosis not present

## 2016-04-10 DIAGNOSIS — M25531 Pain in right wrist: Secondary | ICD-10-CM | POA: Diagnosis not present

## 2016-04-10 DIAGNOSIS — M79601 Pain in right arm: Secondary | ICD-10-CM | POA: Diagnosis not present

## 2016-04-10 DIAGNOSIS — R29898 Other symptoms and signs involving the musculoskeletal system: Secondary | ICD-10-CM | POA: Diagnosis not present

## 2016-04-10 DIAGNOSIS — M25511 Pain in right shoulder: Secondary | ICD-10-CM | POA: Diagnosis not present

## 2016-04-10 DIAGNOSIS — R278 Other lack of coordination: Secondary | ICD-10-CM | POA: Diagnosis not present

## 2016-04-10 DIAGNOSIS — M6281 Muscle weakness (generalized): Secondary | ICD-10-CM | POA: Diagnosis not present

## 2016-04-12 DIAGNOSIS — Z79899 Other long term (current) drug therapy: Secondary | ICD-10-CM | POA: Diagnosis not present

## 2016-04-12 DIAGNOSIS — M3500 Sicca syndrome, unspecified: Secondary | ICD-10-CM | POA: Diagnosis not present

## 2016-04-12 DIAGNOSIS — M79641 Pain in right hand: Secondary | ICD-10-CM | POA: Diagnosis not present

## 2016-04-12 DIAGNOSIS — M545 Low back pain: Secondary | ICD-10-CM | POA: Diagnosis not present

## 2016-04-12 DIAGNOSIS — M154 Erosive (osteo)arthritis: Secondary | ICD-10-CM | POA: Diagnosis not present

## 2016-04-12 DIAGNOSIS — M79642 Pain in left hand: Secondary | ICD-10-CM | POA: Diagnosis not present

## 2016-04-14 DIAGNOSIS — M25511 Pain in right shoulder: Secondary | ICD-10-CM | POA: Diagnosis not present

## 2016-04-14 DIAGNOSIS — G20 Parkinson's disease: Secondary | ICD-10-CM | POA: Diagnosis not present

## 2016-04-14 DIAGNOSIS — R278 Other lack of coordination: Secondary | ICD-10-CM | POA: Diagnosis not present

## 2016-04-14 DIAGNOSIS — R2681 Unsteadiness on feet: Secondary | ICD-10-CM | POA: Diagnosis not present

## 2016-04-14 DIAGNOSIS — M6281 Muscle weakness (generalized): Secondary | ICD-10-CM | POA: Diagnosis not present

## 2016-04-14 DIAGNOSIS — M25512 Pain in left shoulder: Secondary | ICD-10-CM | POA: Diagnosis not present

## 2016-05-02 DIAGNOSIS — J0100 Acute maxillary sinusitis, unspecified: Secondary | ICD-10-CM | POA: Diagnosis not present

## 2016-05-03 ENCOUNTER — Ambulatory Visit (INDEPENDENT_AMBULATORY_CARE_PROVIDER_SITE_OTHER): Payer: Medicare Other | Admitting: Cardiology

## 2016-05-03 ENCOUNTER — Encounter: Payer: Self-pay | Admitting: Cardiology

## 2016-05-03 VITALS — BP 110/68 | HR 71 | Ht 59.0 in | Wt 162.8 lb

## 2016-05-03 DIAGNOSIS — R0602 Shortness of breath: Secondary | ICD-10-CM | POA: Diagnosis not present

## 2016-05-03 DIAGNOSIS — I10 Essential (primary) hypertension: Secondary | ICD-10-CM | POA: Diagnosis not present

## 2016-05-03 DIAGNOSIS — I5032 Chronic diastolic (congestive) heart failure: Secondary | ICD-10-CM | POA: Diagnosis not present

## 2016-05-03 DIAGNOSIS — R011 Cardiac murmur, unspecified: Secondary | ICD-10-CM

## 2016-05-03 DIAGNOSIS — M25471 Effusion, right ankle: Secondary | ICD-10-CM

## 2016-05-03 DIAGNOSIS — M25472 Effusion, left ankle: Secondary | ICD-10-CM

## 2016-05-03 DIAGNOSIS — I509 Heart failure, unspecified: Secondary | ICD-10-CM | POA: Diagnosis not present

## 2016-05-03 LAB — BASIC METABOLIC PANEL
BUN: 15 mg/dL (ref 7–25)
CALCIUM: 9.2 mg/dL (ref 8.6–10.4)
CHLORIDE: 108 mmol/L (ref 98–110)
CO2: 24 mmol/L (ref 20–31)
CREATININE: 0.87 mg/dL (ref 0.60–0.88)
GLUCOSE: 89 mg/dL (ref 65–99)
Potassium: 4.6 mmol/L (ref 3.5–5.3)
SODIUM: 141 mmol/L (ref 135–146)

## 2016-05-03 NOTE — Patient Instructions (Addendum)
Medication Instructions:  Your physician recommends that you continue on your current medications as directed. Please refer to the Current Medication list given to you today.   Labwork: TODAY: BMET  Testing/Procedures: Your physician has requested that you have an echocardiogram. Echocardiography is a painless test that uses sound waves to create images of your heart. It provides your doctor with information about the size and shape of your heart and how well your heart's chambers and valves are working. This procedure takes approximately one hour. There are no restrictions for this procedure.  Follow-Up: Your physician wants you to follow-up in: 6 months with Dr. Turner. You will receive a reminder letter in the mail two months in advance. If you don't receive a letter, please call our office to schedule the follow-up appointment.   Any Other Special Instructions Will Be Listed Below (If Applicable).     If you need a refill on your cardiac medications before your next appointment, please call your pharmacy.   

## 2016-05-03 NOTE — Progress Notes (Signed)
Cardiology Office Note    Date:  05/03/2016   ID:  [name], DOB 03-Sep-1931, MRN [id number]  PCP:  [name], MD  Cardiologist:  Fransico Him, MD   Chief Complaint  Patient presents with  . Congestive Heart Failure  . Hypertension    History of Present Illness:  [name] is a 82 y.o. female with a history of chronic SOB secondary to diastolic dysfunction, chronic diastolic CHF, deconditioning and obesity. She has had chronic SOB for years and a heart cath in 2006 showed normal coronary arteries and normal LVF with normal LVEDP. She also has a history of HTN, chronic LE edema and PVD followed by [name]. She is doing well from a cardiac standpoint. She denies any chest pain or pressure, palpitations or syncope. She has chronic LE edema. She has chronic SOB which is stable and has not changed any since I saw her last. She occasionally has some dizziness.   Past Medical History  Diagnosis Date  . Hypothyroidism   . Shortness of breath   . H/O hiatal hernia   . Arthritis   . Anxiety   . Neuropathy ([place])   . Tremor   . Anemia   . Osteoporosis   . Parkinson disease ([place])   . Fibromyalgia   . Restless leg   . Sjogren's disease ([place])   . Hypertension     dr t Jarielys Girardot  . GERD (gastroesophageal reflux disease)   . Hypercholesterolemia   . SOB (shortness of breath)     chronic due to diastolic dysfunction, deconditioning, obesity  . PVD (peripheral vascular disease) ([facility])     99% stenosis of left anteiror tibial artery, mod stenosis of left distal SFA and popliteal artery followed by [name]  . Chronic diastolic CHF (congestive heart failure) ([place])   . Broken arm     right   . Family history of adverse reaction to anesthesia     pt. states sister vomits  . History of kidney stones   . Fracture of thumb 09/01/2015    right  . Fracture, foot 09/01/2015    right  . History of bronchitis   . 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 Abdominal hysterectomy    . Wrist fracture surgery Bilateral   . Falls      variious fall, broken wrist,and toes  . Total hip arthroplasty      right  . Cardiac catheterization  2006    normal  . Spine surgery  April 2013    Back X's 4  . Fracture surgery Right April 2016    Wrist, Pt. fell  . Eye surgery Bilateral     cateracts  . Back surgery      4 back surgeries,   lumbar fusion  . Spinal cord stimulator insertion N/A 09/09/2015    Procedure: LUMBAR SPINAL CORD STIMULATOR INSERTION;  Surgeon: Clydell Hakim, MD;  Location: [facility];  Service: Neurosurgery;  Laterality: N/A;  LUMBAR SPINAL CORD STIMULATOR INSERTION    Current Medications: Outpatient Prescriptions Prior to Visit  Medication Sig Dispense Refill  . albuterol (PROAIR HFA) 108 (90 BASE) MCG/ACT inhaler Inhale 2 puffs into the lungs every 4 (four) hours as needed for wheezing or shortness of breath.    Marland Kitchen aspirin EC 81 MG tablet Take 81 mg by mouth daily.    . Calcium Carb-Cholecalciferol (CALCIUM +  D3 PO) Take by mouth. Calcium 1000 mg    . carbidopa-levodopa (SINEMET IR) 25-100 MG tablet Take 2 tablets by mouth 3 (three) times daily. 180 tablet 12  . celecoxib (CELEBREX) 200 MG capsule Take 200 mg by mouth 2 (two) times daily.    . Cholecalciferol (VITAMIN D3) 2000 UNITS TABS Take 2,000 Units by mouth daily.     . Coenzyme Q10 100 MG TABS Take 100 mg by mouth daily.    Marland Kitchen conjugated estrogens (PREMARIN) vaginal cream Place 1 Applicatorful vaginally daily as needed (dryness).    Marland Kitchen denosumab (PROLIA) 60 MG/ML SOLN injection Inject 60 mg into the skin every 6 (six) months. Administer in upper arm, thigh, or abdomen    . dexlansoprazole (DEXILANT) 60 MG capsule Take 60 mg by mouth daily.    . diphenhydramine-acetaminophen (TYLENOL PM) 25-500 MG TABS Take 1 tablet by mouth at bedtime.    . docusate sodium (COLACE) 100 MG capsule Take 100 mg by mouth at bedtime.    .  DULoxetine (CYMBALTA) 20 MG capsule TAKE (1) CAPSULE DAILY. 90 capsule 4  . ezetimibe (ZETIA) 10 MG tablet Take 10 mg by mouth at bedtime.     . hydroxychloroquine (PLAQUENIL) 200 MG tablet Take 200 mg by mouth daily.  0  . hypromellose (SYSTANE OVERNIGHT THERAPY) 0.3 % GEL ophthalmic ointment Place 1 application into both eyes at bedtime.    Marland Kitchen levothyroxine (SYNTHROID, LEVOTHROID) 50 MCG tablet Take 50 mcg by mouth daily.    Marland Kitchen lidocaine (XYLOCAINE) 5 % ointment Apply 1 application topically as needed. 02/16/16 script faxed to [facility], 240 gm, 12 refills    . lovastatin (MEVACOR) 40 MG tablet Take 40 mg by mouth at bedtime.    . metoprolol tartrate (LOPRESSOR) 25 MG tablet Take 1 tablet (25 mg total) by mouth daily. 90 tablet 3  . Multiple Vitamin (MULTIVITAMIN WITH MINERALS) TABS tablet Take 1 tablet by mouth daily.    Vladimir Faster Glycol-Propyl Glycol (SYSTANE) 0.4-0.3 % SOLN Place 1 drop into both eyes daily.     . potassium citrate (UROCIT-K) 10 MEQ (1080 MG) SR tablet Take 10 mEq by mouth daily.    . pramipexole (MIRAPEX) 1 MG tablet Take 1 tablet (1 mg total) by mouth 3 (three) times daily. 90 tablet 12  . Skin Protectants, Misc. (EUCERIN) cream Apply 1 application topically as needed (Applies under breast and arm for skin irritation.).    Marland Kitchen solifenacin (VESICARE) 10 MG tablet Take 10 mg by mouth daily.    . traMADol (ULTRAM) 50 MG tablet Take 50 mg by mouth See admin instructions. Take 1 tablet (50 mg) by mouth every morning with 200 mg ER tablet, may take an additional tablet (50 mg) two more times daily as needed for pain    . traMADol (ULTRAM-ER) 200 MG 24 hr tablet Take 200 mg by mouth See admin instructions. Take 1 tablet (200 mg ER) by mouth every morning with a 50 mg tablet    . VESICARE 5 MG tablet Take 5 mg by mouth daily.   10  . furosemide (LASIX) 20 MG tablet Take 1 tablet (20 mg total) by mouth daily. (Patient not taking: Reported on 05/03/2016) 90 tablet 3  . potassium chloride  (K-DUR) 10 MEQ tablet TAKE 1 TABLET ONCE DAILY. (Patient not taking: Reported on 05/03/2016) 90 tablet 0  . trimethoprim (TRIMPEX) 100 MG tablet Take 100 mg by mouth daily. Reported on 05/03/2016  2   No facility-administered medications prior to  visit.     Allergies:   Codeine; Lactose intolerance (gi); Lyrica; Other; Plaquenil; Reglan; Requip; Septra; Shellfish allergy; and Latex   Social History   Social History  . Marital Status: Married    Spouse Name: [name]  . Number of Children: 2  . Years of Education: HS   Occupational History  .      Homemaker   Social History Main Topics  . Smoking status: Never Smoker   . Smokeless tobacco: Never Used  . Alcohol Use: No  . Drug Use: No  . Sexual Activity: No   Other Topics Concern  . None   Social History Narrative   Patient lives at home with her spouse.   Caffeine Use: tea     Family History:  The patient's family history includes COPD in her father; Heart attack in her mother; Heart disease in her mother; Heart failure in her mother; Hypertension in her mother; Restless legs syndrome in her mother.   ROS:   Please see the history of present illness.    ROS All other systems reviewed and are negative.   PHYSICAL EXAM:   VS:  BP 110/68 mmHg  Pulse 71  Ht 4\' 11"  (1.499 m)  Wt 162 lb 12.8 oz (73.846 kg)  BMI 32.86 kg/m2  SpO2 97%   GEN: Well nourished, well developed, in no acute distress HEENT: normal Neck: no JVD, carotid bruits, or masses Cardiac: RRR; no rubs, or gallops,no edema.  Intact distal pulses bilaterally. 2/6 SM at RUSB to LLSB Respiratory:  clear to auscultation bilaterally, normal work of breathing GI: soft, nontender, nondistended, + BS MS: no deformity or atrophy Skin: warm and dry, no rash Neuro:  Alert and Oriented x 3, Strength and sensation are intact Psych: euthymic mood, full affect  Wt Readings from Last 3 Encounters:  05/03/16 162 lb 12.8 oz (73.846 kg)  02/16/16 159 lb (72.122  kg)  10/26/15 160 lb (72.576 kg)      Studies/Labs Reviewed:   EKG:  EKG is not ordered today.    Recent Labs: 09/02/2015: BUN 19; Creatinine, Ser 0.91; Hemoglobin 10.7*; Platelets 289; Potassium 4.2; Sodium 136   Lipid Panel No results found for: CHOL, TRIG, HDL, CHOLHDL, VLDL, LDLCALC, LDLDIRECT  Additional studies/ records that were reviewed today include:  none    ASSESSMENT:    1. Chronic diastolic CHF (congestive heart failure) ([place])   2. Essential hypertension   3. SOB (shortness of breath)   4. Swelling of both ankles   5. Heart murmur      PLAN:  In order of problems listed above:  1. Chronic diastolic CHF - appears euvolemic on exam.  Continue Lasix, BB and ARB. Check BMET. 2. HTN - BP controlled on current medical regimen.  Continue BB and ARB. 3. SOB - this is chronic with normal workup in the past except for diastolic CHF which has been controlled. 4.         Chronic LE edema - continue diuretic 5.         Heart Murmur - repeat echo - she has history of AV sclerosis in the past on echo   Medication Adjustments/Labs and Tests Ordered: Current medicines are reviewed at length with the patient today.  Concerns regarding medicines are outlined above.  Medication changes, Labs and Tests ordered today are listed in the Patient Instructions below.  Patient Instructions  Medication Instructions:  Your physician recommends that you continue on your current medications as directed.  Please refer to the Current Medication list given to you today.   Labwork: TODAY: BMET  Testing/Procedures: Your physician has requested that you have an echocardiogram. Echocardiography is a painless test that uses sound waves to create images of your heart. It provides your doctor with information about the size and shape of your heart and how well your heart's chambers and valves are working. This procedure takes approximately one hour. There are no restrictions for this  procedure.  Follow-Up: Your physician wants you to follow-up in: 6 months with Dr. Radford Pax. You will receive a reminder letter in the mail two months in advance. If you don't receive a letter, please call our office to schedule the follow-up appointment.   Any Other Special Instructions Will Be Listed Below (If Applicable).     If you need a refill on your cardiac medications before your next appointment, please call your pharmacy.       Signed, Fransico Him, MD  05/03/2016 9:20 AM    [facility] [address], [place], [place]  [address] Phone: [phone]; Fax: [phone]

## 2016-05-08 DIAGNOSIS — M25511 Pain in right shoulder: Secondary | ICD-10-CM | POA: Diagnosis not present

## 2016-05-08 DIAGNOSIS — G20 Parkinson's disease: Secondary | ICD-10-CM | POA: Diagnosis not present

## 2016-05-08 DIAGNOSIS — M25512 Pain in left shoulder: Secondary | ICD-10-CM | POA: Diagnosis not present

## 2016-05-08 DIAGNOSIS — R2681 Unsteadiness on feet: Secondary | ICD-10-CM | POA: Diagnosis not present

## 2016-05-08 DIAGNOSIS — R278 Other lack of coordination: Secondary | ICD-10-CM | POA: Diagnosis not present

## 2016-05-08 DIAGNOSIS — M6281 Muscle weakness (generalized): Secondary | ICD-10-CM | POA: Diagnosis not present

## 2016-05-10 DIAGNOSIS — N200 Calculus of kidney: Secondary | ICD-10-CM | POA: Diagnosis not present

## 2016-05-10 DIAGNOSIS — N390 Urinary tract infection, site not specified: Secondary | ICD-10-CM | POA: Diagnosis not present

## 2016-05-10 DIAGNOSIS — R3129 Other microscopic hematuria: Secondary | ICD-10-CM | POA: Diagnosis not present

## 2016-05-10 DIAGNOSIS — R8290 Unspecified abnormal findings in urine: Secondary | ICD-10-CM | POA: Diagnosis not present

## 2016-05-15 DIAGNOSIS — M25512 Pain in left shoulder: Secondary | ICD-10-CM | POA: Diagnosis not present

## 2016-05-15 DIAGNOSIS — R278 Other lack of coordination: Secondary | ICD-10-CM | POA: Diagnosis not present

## 2016-05-15 DIAGNOSIS — R29898 Other symptoms and signs involving the musculoskeletal system: Secondary | ICD-10-CM | POA: Diagnosis not present

## 2016-05-15 DIAGNOSIS — R2681 Unsteadiness on feet: Secondary | ICD-10-CM | POA: Diagnosis not present

## 2016-05-15 DIAGNOSIS — M79601 Pain in right arm: Secondary | ICD-10-CM | POA: Diagnosis not present

## 2016-05-15 DIAGNOSIS — G20 Parkinson's disease: Secondary | ICD-10-CM | POA: Diagnosis not present

## 2016-05-15 DIAGNOSIS — M25531 Pain in right wrist: Secondary | ICD-10-CM | POA: Diagnosis not present

## 2016-05-15 DIAGNOSIS — M25511 Pain in right shoulder: Secondary | ICD-10-CM | POA: Diagnosis not present

## 2016-05-15 DIAGNOSIS — M6281 Muscle weakness (generalized): Secondary | ICD-10-CM | POA: Diagnosis not present

## 2016-05-23 ENCOUNTER — Other Ambulatory Visit: Payer: Self-pay

## 2016-05-23 ENCOUNTER — Ambulatory Visit (HOSPITAL_COMMUNITY): Payer: Medicare Other | Attending: Cardiovascular Disease

## 2016-05-23 DIAGNOSIS — I110 Hypertensive heart disease with heart failure: Secondary | ICD-10-CM | POA: Insufficient documentation

## 2016-05-23 DIAGNOSIS — I350 Nonrheumatic aortic (valve) stenosis: Secondary | ICD-10-CM | POA: Insufficient documentation

## 2016-05-23 DIAGNOSIS — I5032 Chronic diastolic (congestive) heart failure: Secondary | ICD-10-CM | POA: Diagnosis not present

## 2016-05-23 DIAGNOSIS — I358 Other nonrheumatic aortic valve disorders: Secondary | ICD-10-CM | POA: Diagnosis not present

## 2016-05-23 DIAGNOSIS — R011 Cardiac murmur, unspecified: Secondary | ICD-10-CM | POA: Insufficient documentation

## 2016-05-23 DIAGNOSIS — I340 Nonrheumatic mitral (valve) insufficiency: Secondary | ICD-10-CM | POA: Diagnosis not present

## 2016-05-23 DIAGNOSIS — I071 Rheumatic tricuspid insufficiency: Secondary | ICD-10-CM | POA: Diagnosis not present

## 2016-05-24 ENCOUNTER — Telehealth: Payer: Self-pay | Admitting: Cardiology

## 2016-05-24 NOTE — Telephone Encounter (Signed)
Pt returning call to get results-[phone] until 100pm, after that can talk to husband

## 2016-05-24 NOTE — Telephone Encounter (Signed)
I spoke with pt and reviewed echo results with her.  She reports shortness of breath. I reviewed chart and note indicates chronic shortness of breath. I asked pt if this has worsened since last office visit. She states it is not worse but she has had no improvement. She gets short of breath when walking from apartment to dining hall. She has to stop and rest to catch her breath.  She has seen pulmonary in the past but not recently. She states she was not able to complete the test pulmonary had requested.  I told pt I would send message to Dr. Radford Pax for review/recommendations.

## 2016-05-24 NOTE — Telephone Encounter (Signed)
Patient has history of chronic DOE with completely normal cardiac workup in the past including LVEDP at the time of cath

## 2016-05-24 NOTE — Telephone Encounter (Signed)
Left message to call back  

## 2016-05-28 NOTE — Telephone Encounter (Signed)
Follow Up ° °Pt returned call//  °

## 2016-05-28 NOTE — Telephone Encounter (Signed)
Encouraged patient to re-establish with Pulmonary or call PCP since she has had completely normal cardiac work-up in the past including LVEDP at time of cath. She was grateful for assistance.

## 2016-05-28 NOTE — Telephone Encounter (Signed)
Left message to call back  

## 2016-06-08 ENCOUNTER — Encounter: Payer: Self-pay | Admitting: Podiatry

## 2016-06-08 ENCOUNTER — Ambulatory Visit (INDEPENDENT_AMBULATORY_CARE_PROVIDER_SITE_OTHER): Payer: Medicare Other

## 2016-06-08 ENCOUNTER — Ambulatory Visit (INDEPENDENT_AMBULATORY_CARE_PROVIDER_SITE_OTHER): Payer: Medicare Other | Admitting: Podiatry

## 2016-06-08 DIAGNOSIS — M2010 Hallux valgus (acquired), unspecified foot: Secondary | ICD-10-CM

## 2016-06-08 DIAGNOSIS — M205X9 Other deformities of toe(s) (acquired), unspecified foot: Secondary | ICD-10-CM

## 2016-06-08 DIAGNOSIS — M779 Enthesopathy, unspecified: Secondary | ICD-10-CM

## 2016-06-20 DIAGNOSIS — I10 Essential (primary) hypertension: Secondary | ICD-10-CM | POA: Diagnosis not present

## 2016-06-20 DIAGNOSIS — Z0000 Encounter for general adult medical examination without abnormal findings: Secondary | ICD-10-CM | POA: Diagnosis not present

## 2016-06-20 DIAGNOSIS — G20 Parkinson's disease: Secondary | ICD-10-CM | POA: Diagnosis not present

## 2016-06-20 DIAGNOSIS — E039 Hypothyroidism, unspecified: Secondary | ICD-10-CM | POA: Diagnosis not present

## 2016-06-20 DIAGNOSIS — E782 Mixed hyperlipidemia: Secondary | ICD-10-CM | POA: Diagnosis not present

## 2016-06-20 DIAGNOSIS — M8000XA Age-related osteoporosis with current pathological fracture, unspecified site, initial encounter for fracture: Secondary | ICD-10-CM | POA: Diagnosis not present

## 2016-06-20 DIAGNOSIS — Z23 Encounter for immunization: Secondary | ICD-10-CM | POA: Diagnosis not present

## 2016-06-20 DIAGNOSIS — Z1389 Encounter for screening for other disorder: Secondary | ICD-10-CM | POA: Diagnosis not present

## 2016-06-21 NOTE — Progress Notes (Signed)
Patient ID: SEHAJ HEART, female   DOB: Dec 06, 1931, 82 y.o.   MRN: [id number]  Subjective: 82-year-old female presents the also concerns of right big toe pain. She says that she had an injury last year and she look of bone her foot. Over the last several months she's had pain to the big toe joint most of walking and standing. She states that she is unsure if she injured this area during the previous injury or if this is something new. Denies any redness or swelling over the area. Denies any redness or warmth. Denies any systemic complaints such as fevers, chills, nausea, vomiting. No acute changes since last appointment, and no other complaints at this time.   Objective: AAO x3, NAD DP/PT pulses palpable bilaterally, CRT less than 3 seconds Tenderness along the first MTPJ and there is no restriction of range of motion and there is no crepitation. Is mild HAV present. There is no erythema habitus. Be some slight edema around the joint. There is no specific area pinpoint bony tenderness is no pain vibratory sensation. No areas of pinpoint bony tenderness or pain with vibratory sensation. MMT 5/5, ROM WNL. No edema, erythema, increase in warmth to bilateral lower extremities.  No open lesions or pre-ulcerative lesions.  No pain with calf compression, swelling, warmth, erythema  Assessment: Capsulitis right first MPJ  Plan: -All treatment options discussed with the patient including all alternatives, risks, complications.  -X-rays were obtained and reviewed with the patient. Old fracture of the metatarsal #5. Does not tear beginning definitive evidence of acute fracture at this time. -I discussed steroid injection into and around the joint. Understand risks and complications she wishes to proceed with this. Under sterile conditions a mixture of dexamethasone phosphate and local anesthetic was infiltrated to the area of maximal tenderness on any complications. Post injection care was discussed.  Offloading pads were dispensed. Discussed shoe gear modifications. Follow up as scheduled or sooner if any issues are to arise. -Patient encouraged to call the office with any questions, concerns, change in symptoms.   Celesta Gentile, DPM

## 2016-06-22 ENCOUNTER — Encounter: Payer: Self-pay | Admitting: Family

## 2016-06-28 ENCOUNTER — Ambulatory Visit (INDEPENDENT_AMBULATORY_CARE_PROVIDER_SITE_OTHER): Payer: Medicare Other | Admitting: Family

## 2016-06-28 ENCOUNTER — Encounter: Payer: Self-pay | Admitting: Family

## 2016-06-28 ENCOUNTER — Ambulatory Visit (HOSPITAL_COMMUNITY)
Admission: RE | Admit: 2016-06-28 | Discharge: 2016-06-28 | Disposition: A | Discharge status: Home / Self Care | Payer: Medicare Other | Source: Ambulatory Visit | Attending: Family | Admitting: Family

## 2016-06-28 VITALS — BP 136/70 | HR 70 | Temp 97.9°F | Resp 20 | Ht 59.0 in | Wt 166.0 lb

## 2016-06-28 DIAGNOSIS — M79602 Pain in left arm: Secondary | ICD-10-CM

## 2016-06-28 DIAGNOSIS — M79605 Pain in left leg: Secondary | ICD-10-CM

## 2016-06-28 DIAGNOSIS — M79604 Pain in right leg: Secondary | ICD-10-CM

## 2016-06-28 DIAGNOSIS — I779 Disorder of arteries and arterioles, unspecified: Secondary | ICD-10-CM | POA: Diagnosis not present

## 2016-06-28 DIAGNOSIS — E7800 Pure hypercholesterolemia, unspecified: Secondary | ICD-10-CM | POA: Diagnosis not present

## 2016-06-28 DIAGNOSIS — F419 Anxiety disorder, unspecified: Secondary | ICD-10-CM | POA: Insufficient documentation

## 2016-06-28 DIAGNOSIS — I5032 Chronic diastolic (congestive) heart failure: Secondary | ICD-10-CM | POA: Insufficient documentation

## 2016-06-28 DIAGNOSIS — K219 Gastro-esophageal reflux disease without esophagitis: Secondary | ICD-10-CM | POA: Insufficient documentation

## 2016-06-28 DIAGNOSIS — M79601 Pain in right arm: Secondary | ICD-10-CM

## 2016-06-28 DIAGNOSIS — R0989 Other specified symptoms and signs involving the circulatory and respiratory systems: Secondary | ICD-10-CM | POA: Diagnosis present

## 2016-06-28 DIAGNOSIS — I110 Hypertensive heart disease with heart failure: Secondary | ICD-10-CM | POA: Insufficient documentation

## 2016-06-28 NOTE — Progress Notes (Signed)
[facility] [place]   CC: Follow up peripheral artery occlusive disease  History of Present Illness [name] is a 82 y.o. female patient of [name] followed for a history of mild claudication. She has a history of tight stenosis in her left anterior tibial artery as well as popliteal occlusive disease She returns today for 6 months follow up. She does not have claudication symptoms, her walking seems limited by neurogenic claudication.  She denies non-healing wounds.   She had left first ingrown toenail removed about 2012, since then she has redness at the tip of this toe. Her podiatrist is Dr. Paulino Door. She has a remote history of left metatarsal fracture.  She has pins and needles feelings at toes, plantar surfaces, and heels of both feet, mostly at night.  Had 4 lumbar spine surgeries and has what sounds like right sciatic pain at times.   Her appetite is not good lately, and is losing weight, denies food fear or abdominal pain after eating; is seeing a gastroenterologist, states she has a large hiatal hernia.  New Medical or Surgical History: fractured her right wrist in March 2016, had ORIF, just came back [place] after visiting friends and family of a close friend that died. Has recently developed kidney stones, will be seeing a urologist, Dr. Kendell Bane at [facility].   She has multiple areas of muscoskeletal type pain that she attributes to her Sjogren's Syndrome and Parkinson's Disease.  Dr.Penumalli is her neurologist.   Pt Diabetic: No  Pt smoker: non-smoker   Pt meds include:  Statin :Yes  Betablocker: Yes  ASA: Yes  Other anticoagulants/antiplatelets: no     Past Medical History  Diagnosis Date  . Hypothyroidism   . Shortness of breath   . H/O hiatal hernia   . Arthritis   . Anxiety   . Neuropathy ([place])   . Tremor   . Anemia   . Osteoporosis   . Parkinson disease ([place])   . Fibromyalgia   . Restless leg   .  Sjogren's disease ([place])   . Hypertension     dr t turner  . GERD (gastroesophageal reflux disease)   . Hypercholesterolemia   . SOB (shortness of breath)     chronic due to diastolic dysfunction, deconditioning, obesity  . PVD (peripheral vascular disease) ([facility])     99% stenosis of left anteiror tibial artery, mod stenosis of left distal SFA and popliteal artery followed by [name]  . Chronic diastolic CHF (congestive heart failure) ([place])   . Broken arm     right   . Family history of adverse reaction to anesthesia     pt. states sister vomits  . History of kidney stones   . Fracture of thumb 09/01/2015    right  . Fracture, foot 09/01/2015    right  . History of bronchitis   . 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 Smoking status: Never Smoker   . Smokeless tobacco: Never Used  . Alcohol Use: No    Family History Family History  Problem Relation Age of Onset  . Heart attack Mother   . Restless legs syndrome Mother   . Heart failure Mother   . Heart disease Mother   . Hypertension Mother   . COPD Father     Past Surgical History  Procedure Laterality Date  . Abdominal hysterectomy    .  Wrist fracture surgery Bilateral   . Falls      variious fall, broken wrist,and toes  . Total hip arthroplasty      right  . Cardiac catheterization  2006    normal  . Spine surgery  April 2013    Back X's 4  . Fracture surgery Right April 2016    Wrist, Pt. fell  . Eye surgery Bilateral     cateracts  . Back surgery      4 back surgeries,   lumbar fusion  . Spinal cord stimulator insertion N/A 09/09/2015    Procedure: LUMBAR SPINAL CORD STIMULATOR INSERTION;  Surgeon: Clydell Hakim, MD;  Location: [facility];  Service: Neurosurgery;  Laterality: N/A;  LUMBAR SPINAL CORD STIMULATOR INSERTION    Allergies  Allergen Reactions  . Codeine Other (See Comments)    Headache Severe nightmares  "will not take it"  .  Lactose Intolerance (Gi) Nausea And Vomiting and Other (See Comments)    Reactions to meds that contain lactose derivitives and foods  . Lyrica [Pregabalin] Other (See Comments)    Leg edema   . Other Other (See Comments)    Pain meds received after surgery caused nightmares (Percocet,Hydrocodone,Morphine)  . Plaquenil [Hydroxychloroquine Sulfate] Other (See Comments)    Stomach upset  . Reglan [Metoclopramide] Other (See Comments)    Stomach upset  . Requip [Ropinirole Hcl] Other (See Comments)    Stomach upset   . Septra [Sulfamethoxazole-Trimethoprim] Nausea And Vomiting  . Shellfish Allergy Nausea And Vomiting    Upset stomach  . Latex Rash    Redness and sore (steri strips under a cast caused a rash spring 2016)    Current Outpatient Prescriptions  Medication Sig Dispense Refill  . albuterol (PROAIR HFA) 108 (90 BASE) MCG/ACT inhaler Inhale 2 puffs into the lungs every 4 (four) hours as needed for wheezing or shortness of breath.    Marland Kitchen aspirin EC 81 MG tablet Take 81 mg by mouth daily.    . Calcium Carb-Cholecalciferol (CALCIUM + D3 PO) Take by mouth. Calcium 1000 mg    . carbidopa-levodopa (SINEMET IR) 25-100 MG tablet Take 2 tablets by mouth 3 (three) times daily. 180 tablet 12  . celecoxib (CELEBREX) 200 MG capsule Take 200 mg by mouth 2 (two) times daily.    . Cholecalciferol (VITAMIN D3) 2000 UNITS TABS Take 2,000 Units by mouth daily.     . Coenzyme Q10 100 MG TABS Take 100 mg by mouth daily.    Marland Kitchen conjugated estrogens (PREMARIN) vaginal cream Place 1 Applicatorful vaginally daily as needed (dryness).    Marland Kitchen denosumab (PROLIA) 60 MG/ML SOLN injection Inject 60 mg into the skin every 6 (six) months. Administer in upper arm, thigh, or abdomen    . dexlansoprazole (DEXILANT) 60 MG capsule Take 60 mg by mouth daily.    Marland Kitchen diltiazem (CARDIZEM) 120 MG tablet Take 120 mg by mouth daily.    . diphenhydramine-acetaminophen (TYLENOL PM) 25-500 MG TABS Take 1 tablet by mouth at bedtime.     . docusate sodium (COLACE) 100 MG capsule Take 100 mg by mouth at bedtime.    . DULoxetine (CYMBALTA) 20 MG capsule TAKE (1) CAPSULE DAILY. 90 capsule 4  . ezetimibe (ZETIA) 10 MG tablet Take 10 mg by mouth at bedtime.     . furosemide (LASIX) 20 MG tablet Take 20 mg by mouth as needed.    . hypromellose (SYSTANE OVERNIGHT THERAPY) 0.3 % GEL ophthalmic ointment Place 1 application into  both eyes at bedtime.    Marland Kitchen levothyroxine (SYNTHROID, LEVOTHROID) 50 MCG tablet Take 50 mcg by mouth daily.    Marland Kitchen lidocaine (XYLOCAINE) 5 % ointment Apply 1 application topically as needed. 02/16/16 script faxed to [facility], 240 gm, 12 refills    . lovastatin (MEVACOR) 40 MG tablet Take 40 mg by mouth at bedtime.    . metoprolol tartrate (LOPRESSOR) 25 MG tablet Take 1 tablet (25 mg total) by mouth daily. 90 tablet 3  . Multiple Vitamin (MULTIVITAMIN WITH MINERALS) TABS tablet Take 1 tablet by mouth daily.    Vladimir Faster Glycol-Propyl Glycol (SYSTANE) 0.4-0.3 % SOLN Place 1 drop into both eyes daily.     . potassium citrate (UROCIT-K) 10 MEQ (1080 MG) SR tablet Take 10 mEq by mouth daily.    . pramipexole (MIRAPEX) 1 MG tablet Take 1 tablet (1 mg total) by mouth 3 (three) times daily. 90 tablet 12  . Skin Protectants, Misc. (EUCERIN) cream Apply 1 application topically as needed (Applies under breast and arm for skin irritation.).    Marland Kitchen solifenacin (VESICARE) 10 MG tablet Take 10 mg by mouth daily.    . traMADol (ULTRAM) 50 MG tablet Take 50 mg by mouth See admin instructions. Take 1 tablet (50 mg) by mouth every morning with 200 mg ER tablet, may take an additional tablet (50 mg) two more times daily as needed for pain    . traMADol (ULTRAM-ER) 200 MG 24 hr tablet Take 200 mg by mouth See admin instructions. Take 1 tablet (200 mg ER) by mouth every morning with a 50 mg tablet    . VESICARE 5 MG tablet Take 5 mg by mouth daily.   10  . hydroxychloroquine (PLAQUENIL) 200 MG tablet Take 200 mg by mouth daily. Reported on  06/28/2016  0   No current facility-administered medications for this visit.    ROS: See HPI for pertinent positives and negatives.   Physical Examination  Filed Vitals:   06/28/16 0926  BP: 136/70  Pulse: 70  Temp: 97.9 F (36.6 C)  TempSrc: Oral  Resp: 20  Height: 4\' 11"  (1.499 m)  Weight: 166 lb (75.297 kg)  SpO2: 99%   Body mass index is 33.51 kg/(m^2).  General: A&O x 3, WDWN, obese female.  Gait: normal  Eyes: PERRLA,  Pulmonary: Respirations are non labored, CTAB Cardiac: regular rhythm, + murmur   Carotid Bruits  Left  Right    Negative  Negative    Aorta: is not palpable  Radial pulses: 2+ right, 1+ left  VASCULAR EXAM:  Extremities without ischemic changes.  without Gangrene; without open wounds. Trace non pitting edema in her lower legs.  LE Pulses  LEFT  RIGHT   FEMORAL  Tender to palpation, no swelling or discoloration, unable to palpate  Tender to palpation, unable to palpate  POPLITEAL  not palpable  Not palpable   POSTERIOR TIBIAL  not palpable  not palpable   DORSALIS PEDIS  ANTERIOR TIBIAL  not palpable  not palpable    Abdomen: soft, NT, no palpable masses, dry mouth. Skin: no rashes, no ulcers.  Musculoskeletal: no muscle wasting or atrophy, tender to palpation at multiple areas.  Neurologic: A&O X 3; Appropriate Affect, MOTOR FUNCTION: moving all extremities equally, motor strength 4/5 throughout. Speech is fluent/normal. CN 2-12 intact.                 ASSESSMENT: LURLENE SCHLUTER is a 82 y.o. female whom we are following for  a history of mild claudication. She has a history of tight stenosis in her left anterior tibial artery as well as popliteal occlusive disease.  She does not have claudication symptoms, her walking seems limited by neurogenic neuropathy, aching in her lower legs and feet, as she has significant lumbar spine issues There are no signs  of ischemia in her feet/legs. Today's ABI's are not reliable as her vessels are non compressible. Both TBI's remain normal and all waveforms are triphasic.    PLAN:  Based on the patient's vascular studies and examination, pt will return to clinic in 1 year with ABI's.   I discussed in depth with the patient the nature of atherosclerosis, and emphasized the importance of maximal medical management including strict control of blood pressure, blood glucose, and lipid levels, obtaining regular exercise, and continued cessation of smoking.  The patient is aware that without maximal medical management the underlying atherosclerotic disease process will progress, limiting the benefit of any interventions.  The patient was given information about PAD including signs, symptoms, treatment, what symptoms should prompt the patient to seek immediate medical care, and risk reduction measures to take.  Clemon Chambers, RN, MSN, FNP-C [facility] Phone: [phone]  Clinic MD: Early  06/28/2016 9:34 AM

## 2016-06-28 NOTE — Patient Instructions (Signed)
Peripheral Vascular Disease Peripheral vascular disease (PVD) is a disease of the blood vessels that are not part of your heart and brain. A simple term for PVD is poor circulation. In most cases, PVD narrows the blood vessels that carry blood from your heart to the rest of your body. This can result in a decreased supply of blood to your arms, legs, and internal organs, like your stomach or kidneys. However, it most often affects a person's lower legs and feet. There are two types of PVD.  Organic PVD. This is the more common type. It is caused by damage to the structure of blood vessels.  Functional PVD. This is caused by conditions that make blood vessels contract and tighten (spasm). Without treatment, PVD tends to get worse over time. PVD can also lead to acute ischemic limb. This is when an arm or limb suddenly has trouble getting enough blood. This is a medical emergency. CAUSES Each type of PVD has many different causes. The most common cause of PVD is buildup of a fatty material (plaque) inside of your arteries (atherosclerosis). Small amounts of plaque can break off from the walls of the blood vessels and become lodged in a smaller artery. This blocks blood flow and can cause acute ischemic limb. Other common causes of PVD include:  Blood clots that form inside of blood vessels.  Injuries to blood vessels.  Diseases that cause inflammation of blood vessels or cause blood vessel spasms.  Health behaviors and health history that increase your risk of developing PVD. RISK FACTORS  You may have a greater risk of PVD if you:  Have a family history of PVD.  Have certain medical conditions, including:  High cholesterol.  Diabetes.  High blood pressure (hypertension).  Coronary heart disease.  Past problems with blood clots.  Past injury, such as burns or a broken bone. These may have damaged blood vessels in your limbs.  Buerger disease. This is caused by inflamed blood  vessels in your hands and feet.  Some forms of arthritis.  Rare birth defects that affect the arteries in your legs.  Use tobacco.  Do not get enough exercise.  Are obese.  Are age 50 or older. SIGNS AND SYMPTOMS  PVD may cause many different symptoms. Your symptoms depend on what part of your body is not getting enough blood. Some common signs and symptoms include:  Cramps in your lower legs. This may be a symptom of poor leg circulation (claudication).  Pain and weakness in your legs while you are physically active that goes away when you rest (intermittent claudication).  Leg pain when at rest.  Leg numbness, tingling, or weakness.  Coldness in a leg or foot, especially when compared with the other leg.  Skin or hair changes. These can include:  Hair loss.  Shiny skin.  Pale or bluish skin.  Thick toenails.  Inability to get or maintain an erection (erectile dysfunction). People with PVD are more prone to developing ulcers and sores on their toes, feet, or legs. These may take longer than normal to heal. DIAGNOSIS Your health care provider may diagnose PVD from your signs and symptoms. The health care provider will also do a physical exam. You may have tests to find out what is causing your PVD and determine its severity. Tests may include:  Blood pressure recordings from your arms and legs and measurements of the strength of your pulses (pulse volume recordings).  Imaging studies using sound waves to take pictures of   the blood flow through your blood vessels (Doppler ultrasound).  Injecting a dye into your blood vessels before having imaging studies using:  X-rays (angiogram or arteriogram).  Computer-generated X-rays (CT angiogram).  A powerful electromagnetic field and a computer (magnetic resonance angiogram or MRA). TREATMENT Treatment for PVD depends on the cause of your condition and the severity of your symptoms. It also depends on your age. Underlying  causes need to be treated and controlled. These include long-lasting (chronic) conditions, such as diabetes, high cholesterol, and high blood pressure. You may need to first try making lifestyle changes and taking medicines. Surgery may be needed if these do not work. Lifestyle changes may include:  Quitting smoking.  Exercising regularly.  Following a low-fat, low-cholesterol diet. Medicines may include:  Blood thinners to prevent blood clots.  Medicines to improve blood flow.  Medicines to improve your blood cholesterol levels. Surgical procedures may include:  A procedure that uses an inflated balloon to open a blocked artery and improve blood flow (angioplasty).  A procedure to put in a tube (stent) to keep a blocked artery open (stent implant).  Surgery to reroute blood flow around a blocked artery (peripheral bypass surgery).  Surgery to remove dead tissue from an infected wound on the affected limb.  Amputation. This is surgical removal of the affected limb. This may be necessary in cases of acute ischemic limb that are not improved through medical or surgical treatments. HOME CARE INSTRUCTIONS  Take medicines only as directed by your health care provider.  Do not use any tobacco products, including cigarettes, chewing tobacco, or electronic cigarettes. If you need help quitting, ask your health care provider.  Lose weight if you are overweight, and maintain a healthy weight as directed by your health care provider.  Eat a diet that is low in fat and cholesterol. If you need help, ask your health care provider.  Exercise regularly. Ask your health care provider to suggest some good activities for you.  Use compression stockings or other mechanical devices as directed by your health care provider.  Take good care of your feet.  Wear comfortable shoes that fit well.  Check your feet often for any cuts or sores. SEEK MEDICAL CARE IF:  You have cramps in your legs  while walking.  You have leg pain when you are at rest.  You have coldness in a leg or foot.  Your skin changes.  You have erectile dysfunction.  You have cuts or sores on your feet that are not healing. SEEK IMMEDIATE MEDICAL CARE IF:  Your arm or leg turns cold and blue.  Your arms or legs become red, warm, swollen, painful, or numb.  You have chest pain or trouble breathing.  You suddenly have weakness in your face, arm, or leg.  You become very confused or lose the ability to speak.  You suddenly have a very bad headache or lose your vision.   This information is not intended to replace advice given to you by your health care provider. Make sure you discuss any questions you have with your health care provider.   Document Released: 01/03/2005 Document Revised: 12/17/2014 Document Reviewed: 05/06/2014 Elsevier Interactive Patient Education 2016 [employer]  

## 2016-07-13 ENCOUNTER — Ambulatory Visit (INDEPENDENT_AMBULATORY_CARE_PROVIDER_SITE_OTHER): Payer: Medicare Other | Admitting: Podiatry

## 2016-07-13 ENCOUNTER — Encounter: Payer: Self-pay | Admitting: Podiatry

## 2016-07-13 DIAGNOSIS — M779 Enthesopathy, unspecified: Secondary | ICD-10-CM

## 2016-07-13 DIAGNOSIS — G629 Polyneuropathy, unspecified: Secondary | ICD-10-CM

## 2016-07-13 DIAGNOSIS — M79671 Pain in right foot: Secondary | ICD-10-CM | POA: Diagnosis not present

## 2016-07-13 MED ORDER — NONFORMULARY OR COMPOUNDED ITEM: 1.0000 g | Freq: Three times a day (TID) | 3 refills | Status: DC

## 2016-07-17 ENCOUNTER — Ambulatory Visit: Payer: Medicare Other | Admitting: Emergency Medicine

## 2016-07-17 DIAGNOSIS — M545 Low back pain: Secondary | ICD-10-CM | POA: Diagnosis not present

## 2016-07-19 ENCOUNTER — Other Ambulatory Visit: Payer: Self-pay | Admitting: Neurological Surgery

## 2016-07-19 DIAGNOSIS — M545 Low back pain: Secondary | ICD-10-CM

## 2016-07-20 ENCOUNTER — Ambulatory Visit: Payer: Medicare Other | Admitting: Emergency Medicine

## 2016-07-20 NOTE — Progress Notes (Signed)
Subjective: 82 year old female presents the occipital vibration a right foot pain. She states the pain is noted and she points more proximal torsion pain. She did not get the new compound cream border to either. She states that she still been the same type of pain. This has been on them for quite some time. She also describes burning pain to her feet as well. Denies any systemic complaints such as fevers, chills, nausea, vomiting. No acute changes since last appointment, and no other complaints at this time.   Objective: AAO x3, NAD DP/PT pulses palpable bilaterally, CRT less than 3 seconds Subjective lesions. Numbness and tingling as well as burning pain to her feet. States she has tenderness to palpation on the right foot just proximal the first MTPJ on the medial band of plantar fascia distally. There is no edema, erythema, increase in warmth. There is no pain with first MPJ range of motion today. There is no area pinpoint bony tenderness or pain the vibratory sensation. No edema, erythema, increase in warmth to bilateral lower extremities.  No open lesions or pre-ulcerative lesions.  No pain with calf compression, swelling, warmth, erythema  Assessment: Right foot tendinitis, neuropathy  Plan: -All treatment options discussed with the patient including all alternatives, risks, complications.  -Offload the majority symptoms she experiences neuropathy. She has follow up with neurology as well. I will reorder compound cream for neuropathy today. -I discussed repeat steroid injection to the area of maximal tenderness today along the area tendinitis. She wishes to proceed. Under sterile conditions a mixture of Kenalog and local anesthetic was infiltrated without Complications. -Given the amount of pain that she is having to the right foot as well as the continuation symptoms were placing the surgical shoe today. She has one home which is requesting a new one today that fits more appropriate. This  was dispensed today and fitted for her. -Follow-up as scheduled or sooner if needed. -Patient encouraged to call the office with any questions, concerns, change in symptoms.   [name], DPM

## 2016-07-25 ENCOUNTER — Ambulatory Visit
Admission: RE | Admit: 2016-07-25 | Discharge: 2016-07-25 | Disposition: A | Discharge status: Home / Self Care | Payer: Medicare Other | Source: Ambulatory Visit | Attending: Neurological Surgery | Admitting: Neurological Surgery

## 2016-07-25 DIAGNOSIS — M545 Low back pain: Secondary | ICD-10-CM

## 2016-07-25 DIAGNOSIS — M4326 Fusion of spine, lumbar region: Secondary | ICD-10-CM | POA: Diagnosis not present

## 2016-07-27 ENCOUNTER — Ambulatory Visit (INDEPENDENT_AMBULATORY_CARE_PROVIDER_SITE_OTHER): Payer: Medicare Other

## 2016-07-27 ENCOUNTER — Encounter: Payer: Self-pay | Admitting: Podiatry

## 2016-07-27 ENCOUNTER — Ambulatory Visit (INDEPENDENT_AMBULATORY_CARE_PROVIDER_SITE_OTHER): Payer: Medicare Other | Admitting: Podiatry

## 2016-07-27 DIAGNOSIS — M79671 Pain in right foot: Secondary | ICD-10-CM

## 2016-07-27 DIAGNOSIS — I779 Disorder of arteries and arterioles, unspecified: Secondary | ICD-10-CM

## 2016-07-27 DIAGNOSIS — R52 Pain, unspecified: Secondary | ICD-10-CM

## 2016-07-27 DIAGNOSIS — M779 Enthesopathy, unspecified: Secondary | ICD-10-CM | POA: Diagnosis not present

## 2016-07-27 DIAGNOSIS — M2010 Hallux valgus (acquired), unspecified foot: Secondary | ICD-10-CM

## 2016-07-27 DIAGNOSIS — G629 Polyneuropathy, unspecified: Secondary | ICD-10-CM

## 2016-07-30 NOTE — Progress Notes (Signed)
Subjective: 82 year old female presents the office for follow-up evaluation of right foot pain. She states that the pain has improved compared to last appointment she is able to wear regular shoe. Denies any swelling or redness. No recent injury or trauma.  Denies any systemic complaints such as fevers, chills, nausea, vomiting. No acute changes since last appointment, and no other complaints at this time.   Objective: AAO x3, NAD DP/PT pulses palpable bilaterally, CRT less than 3 seconds There is currently minimal tenderness along the right 1st MTPJ and is greatly improved. There is no other areas of pinpoint bony tenderness or pain the vibratory sensation bilaterally. There is no edema, erythema or increase in warmth. HAV is present.  No open lesions or pre-ulcerative lesions.  No pain with calf compression, swelling, warmth, erythema  Assessment: Right foot tendinitis, neuropathy  Plan: -All treatment options discussed with the patient including all alternatives, risks, complications.  -X-rays were obtained and reviewed with the patient. Old fracture to the 5th metatarsal. No evidence of acute fracture or stress fracture.  -Continue compound cream for neuropathy as well -Discussed supportive shoe gear. -Offloading pads -Follow symptoms recur worsen. Call any questions concerns and meantime.  Celesta Gentile, DPM

## 2016-07-31 DIAGNOSIS — Z6832 Body mass index (BMI) 32.0-32.9, adult: Secondary | ICD-10-CM | POA: Diagnosis not present

## 2016-07-31 DIAGNOSIS — M545 Low back pain: Secondary | ICD-10-CM | POA: Diagnosis not present

## 2016-08-09 DIAGNOSIS — M7061 Trochanteric bursitis, right hip: Secondary | ICD-10-CM | POA: Diagnosis not present

## 2016-08-17 ENCOUNTER — Ambulatory Visit (INDEPENDENT_AMBULATORY_CARE_PROVIDER_SITE_OTHER): Payer: Medicare Other | Admitting: Diagnostic Neuroimaging

## 2016-08-17 ENCOUNTER — Encounter: Payer: Self-pay | Admitting: Diagnostic Neuroimaging

## 2016-08-17 VITALS — BP 107/56 | HR 67 | Wt 159.6 lb

## 2016-08-17 DIAGNOSIS — M3509 Sicca syndrome with other organ involvement: Secondary | ICD-10-CM

## 2016-08-17 DIAGNOSIS — R1319 Other dysphagia: Secondary | ICD-10-CM

## 2016-08-17 DIAGNOSIS — G20 Parkinson's disease: Secondary | ICD-10-CM

## 2016-08-17 DIAGNOSIS — G63 Polyneuropathy in diseases classified elsewhere: Secondary | ICD-10-CM

## 2016-08-17 DIAGNOSIS — R269 Unspecified abnormalities of gait and mobility: Secondary | ICD-10-CM | POA: Diagnosis not present

## 2016-08-17 DIAGNOSIS — R413 Other amnesia: Secondary | ICD-10-CM

## 2016-08-17 DIAGNOSIS — I779 Disorder of arteries and arterioles, unspecified: Secondary | ICD-10-CM

## 2016-08-17 MED ORDER — CARBIDOPA-LEVODOPA 25-100 MG PO TABS: 2.0000 | ORAL_TABLET | Freq: Three times a day (TID) | ORAL | 12 refills | Status: DC

## 2016-08-17 MED ORDER — PRAMIPEXOLE DIHYDROCHLORIDE 1 MG PO TABS: 1.0000 mg | ORAL_TABLET | Freq: Three times a day (TID) | ORAL | 12 refills | Status: DC

## 2016-08-17 NOTE — Progress Notes (Signed)
[facility]  PATIENT: [name] DOB: [birth year]  REFERRING CLINICIAN:  HISTORY FROM: patient and husband REASON FOR VISIT: follow up   HISTORICAL  CHIEF COMPLAINT:  Chief Complaint  Patient presents with  . Other    rm 7, Parkinson's disease, husband- John, "I often feel shakey inside. I don't think my balance is as good."  . Follow-up    6 month    HISTORY OF PRESENT ILLNESS:   UPDATE 08/17/16: Since last visit, continues with intermittent shaking sens on "inside". Sometimes forgets the mid day dose of carb/levo.  UPDATE 02/16/16: Since last visit, doing well. Memory loss stable. Having some pain in feet. Now has spinal cord stim for back pain issues.   UPDATE 10/13/15: Since last visit, more memory loss problems. Forgetting order when playing bridge. Forgetting recent conversations and names. Had fall in Aug 2016, with right foot fracture.   UPDATE 07/06/15: More issues with kidney stones, UTIs, shoulder pain. No wearing off. No tremor. More balance and freezing issues. Short term memory problems continue. Patient is in denial about need to use walker and her own memory problems.   UPDATE 03/02/15: Since last visit, patient's balance has worsened. She fell recently (02/26/15), with right non-displaced distal right radial fracture. Notes more tremor and wearing off around 4pm everyday. Currently on carb/levo 1.5tab TID and pramipexole 1mg  TID. Sometimes takes a 4th dose of carb/levo 1.5tabs. Had been using a walker, mainly at night time, but did not use at time of fall.  UPDATE 09/03/14: Since last visit, tremor is well controlled. Gait and freezing are stable,. Doing well on (carb/levo 1.5 tabs + pramipexole 1mg ) taken TID (sometimes QID, with extra late evening dose if wearing off is notable). Having more foot pain, numbness, drooling prob, flushing, anxiety. Saw psychology last visit, then rec to see psychiatry, but sxs improved and the psychiatrist who  was recommended to them was not taking new patients.   UPDATE 02/24/14: Since last visit, has increased carb/levo up to 1 tab 4x per day; has also incr pramipexole up to 1mg  4x per day (for convenience of remembering, even though we normally dose BID or TID). Also with more anxiety, decr confidence, more tremor in right upper ext. Also reports some right shoulder, bilateral knee, low back pain. Has noticed some freezing of gait. Has appt with psychology later today.  UPDATE 08/26/13: Since last visit, noticing some wearing off in early evening. Takes carb/levo 6:30am, noon, 9pm. Also getting more "worked up" lately, reading about PD online. No falls. Uses cane and walker. More fatigue and mental exhaustion.  UPDATE 02/04/13: Since last visit, has had some minor falls. tremor and PD sxs better on carb/levo. No anxiety. Some drooling and hypophonia. Not using walker or cane all the time. constipation stable.  UPDATE 10/06/12: Since last visit, had another back surgery (april 2013), dx'd with CHF, dx'd with sjogrens, also with fall in August 2013 with pelvic fracture. Reports good tremor control. Some freezing of gait.  UPDATE 02/13/12: Now on pramipexole 1mg  QID (7am, noon, 5pm, 10pm). Some wearing off around 5pm. Also, had rxn to [facility], now with right leg pain. Also getting lumbar radiculopathy eval per ortho.  UPDATE 08/14/11: Tremor developing around 4pm now.  More anxiety per husband.  Also with UTI x 2.  Now dx'd with left foot fractures (? "stubbed" toes; no falls).  UPDATE 04/27/11: Tolerating pramipexole 1mg  TID (7am, 1-2pm, 7p).  some wearing off right at 7p.  feels unsteady, and has ongoing back problems and pain.  UPDATE 01/24/11: Doing slightly better with pramipexole 1mg  BID.  Tremor returns later in the day before evening dose.  Still with gait and balance diff.    PRIOR HPI: 82 year old female with history of hypertension, hypercholesterolemia, hypothyroidism, fibromyalgia, restless leg  syndrome, presenting for evaluation of tremor for the past 3 years. She is here with her husband. Approximately 3 years ago patient developed intermittent tremor in her left hand particularly in her thumb and fingers.  Tremor is most noticeable at rest and in the evening.  In June 2011, patient tripped, fell and broke her left wrist and forearm.  This was repaired surgically and in a cast; during this time and since then her tremor has improved. Patient also has history of restless leg syndrome for the past 5 years. She's been on pramipexole for number of years (0.5mg  qhs) and recently increased to 1 mg qhs a few weeks ago.  The patient's mother also had restless leg syndrome. Patient has a diagnosis of iron deficiency anemia but is not on iron replacement.   REVIEW OF SYSTEMS: Full 14 system review of systems performed and negative except for trouble swallowing fatigue appetite change walking diff rash constipation shortness double vision.    ALLERGIES: Allergies  Allergen Reactions  . Codeine Other (See Comments)    Headache Severe nightmares  "will not take it"  . Lactose Intolerance (Gi) Nausea And Vomiting and Other (See Comments)    Reactions to meds that contain lactose derivitives and foods  . Lyrica [Pregabalin] Other (See Comments)    Leg edema   . Other Other (See Comments)    Pain meds received after surgery caused nightmares (Percocet,Hydrocodone,Morphine)  . Plaquenil [Hydroxychloroquine Sulfate] Other (See Comments)    Stomach upset  . Reglan [Metoclopramide] Other (See Comments)    Stomach upset  . Requip [Ropinirole Hcl] Other (See Comments)    Stomach upset   . Septra [Sulfamethoxazole-Trimethoprim] Nausea And Vomiting  . Shellfish Allergy Nausea And Vomiting    Upset stomach  . Latex Rash    Redness and sore (steri strips under a cast caused a rash spring 2016)    HOME MEDICATIONS: Outpatient Medications Prior to Visit  Medication Sig Dispense Refill  .  albuterol (PROAIR HFA) 108 (90 BASE) MCG/ACT inhaler Inhale 2 puffs into the lungs every 4 (four) hours as needed for wheezing or shortness of breath.    Marland Kitchen aspirin EC 81 MG tablet Take 81 mg by mouth daily.    . Calcium Carb-Cholecalciferol (CALCIUM + D3 PO) Take by mouth. Calcium 1000 mg    . carbidopa-levodopa (SINEMET IR) 25-100 MG tablet Take 2 tablets by mouth 3 (three) times daily. 180 tablet 12  . celecoxib (CELEBREX) 200 MG capsule Take 200 mg by mouth 2 (two) times daily.    . Cholecalciferol (VITAMIN D3) 2000 UNITS TABS Take 2,000 Units by mouth daily.     . Coenzyme Q10 100 MG TABS Take 100 mg by mouth daily.    Marland Kitchen denosumab (PROLIA) 60 MG/ML SOLN injection Inject 60 mg into the skin every 6 (six) months. Administer in upper arm, thigh, or abdomen    . dexlansoprazole (DEXILANT) 60 MG capsule Take 60 mg by mouth daily.    Marland Kitchen diltiazem (CARDIZEM) 120 MG tablet Take 120 mg by mouth daily.    . diphenhydramine-acetaminophen (TYLENOL PM) 25-500 MG TABS Take 1 tablet by mouth at bedtime.    . docusate sodium (COLACE)  100 MG capsule Take 100 mg by mouth at bedtime.    . DULoxetine (CYMBALTA) 20 MG capsule TAKE (1) CAPSULE DAILY. 90 capsule 4  . ezetimibe (ZETIA) 10 MG tablet Take 10 mg by mouth at bedtime.     . furosemide (LASIX) 20 MG tablet Take 20 mg by mouth as needed.    . hydroxychloroquine (PLAQUENIL) 200 MG tablet Take 200 mg by mouth daily. Reported on 06/28/2016  0  . hypromellose (SYSTANE OVERNIGHT THERAPY) 0.3 % GEL ophthalmic ointment Place 1 application into both eyes at bedtime.    Marland Kitchen levothyroxine (SYNTHROID, LEVOTHROID) 50 MCG tablet Take 50 mcg by mouth daily.    Marland Kitchen lidocaine (XYLOCAINE) 5 % ointment Apply 1 application topically as needed. 02/16/16 script faxed to [facility], 240 gm, 12 refills    . lovastatin (MEVACOR) 40 MG tablet Take 40 mg by mouth at bedtime.    . metoprolol tartrate (LOPRESSOR) 25 MG tablet Take 1 tablet (25 mg total) by mouth daily. 90 tablet 3  . Multiple  Vitamin (MULTIVITAMIN WITH MINERALS) TABS tablet Take 1 tablet by mouth daily.    . NONFORMULARY OR COMPOUNDED ITEM Apply 1-2 g topically 3 (three) times daily. 120 each 3  . Polyethyl Glycol-Propyl Glycol (SYSTANE) 0.4-0.3 % SOLN Place 1 drop into both eyes daily.     . potassium citrate (UROCIT-K) 10 MEQ (1080 MG) SR tablet Take 10 mEq by mouth daily.    . pramipexole (MIRAPEX) 1 MG tablet Take 1 tablet (1 mg total) by mouth 3 (three) times daily. 90 tablet 12  . Skin Protectants, Misc. (EUCERIN) cream Apply 1 application topically as needed (Applies under breast and arm for skin irritation.).    Marland Kitchen traMADol (ULTRAM) 50 MG tablet Take 50 mg by mouth See admin instructions. Take 1 tablet (50 mg) by mouth every morning with 200 mg ER tablet, may take an additional tablet (50 mg) two more times daily as needed for pain    . traMADol (ULTRAM-ER) 200 MG 24 hr tablet Take 200 mg by mouth See admin instructions. Take 1 tablet (200 mg ER) by mouth every morning with a 50 mg tablet    . conjugated estrogens (PREMARIN) vaginal cream Place 1 Applicatorful vaginally daily as needed (dryness).    . solifenacin (VESICARE) 10 MG tablet Take 10 mg by mouth daily.    . VESICARE 5 MG tablet Take 5 mg by mouth daily.   10   No facility-administered medications prior to visit.     PAST MEDICAL HISTORY: Past Medical History:  Diagnosis Date  . Anemia   . Anxiety   . Arthritis   . Broken arm    right   . Chronic diastolic CHF (congestive heart failure) ([place])   . Family history of adverse reaction to anesthesia    pt. states sister vomits  . Fibromyalgia   . Fracture of thumb 09/01/2015   right  . Fracture, foot 09/01/2015   right  . GERD (gastroesophageal reflux disease)   . H/O hiatal hernia   . Heart murmur    AV sclerosis by echo  . History of bronchitis   . History of kidney stones   . Hypercholesterolemia   . Hypertension    dr t turner  . Hypothyroidism   . Neuropathy ([place])   . Osteoporosis    . Parkinson disease ([place])   . PVD (peripheral vascular disease) ([facility])    99% stenosis of left anteiror tibial artery, mod stenosis of left distal  SFA and popliteal artery followed by [name]  . Restless leg   . Shortness of breath   . Sjogren's disease ([place])   . SOB (shortness of breath)    chronic due to diastolic dysfunction, deconditioning, obesity  . Tremor     PAST SURGICAL HISTORY: Past Surgical History:  Procedure Laterality Date  . ABDOMINAL HYSTERECTOMY    . BACK SURGERY     4 back surgeries,   lumbar fusion  . CARDIAC CATHETERIZATION  2006   normal  . EYE SURGERY Bilateral    cateracts  . falls     variious fall, broken wrist,and toes  . FRACTURE SURGERY Right April 2016   Wrist, Pt. fell  . SPINAL CORD STIMULATOR INSERTION N/A 09/09/2015   Procedure: LUMBAR SPINAL CORD STIMULATOR INSERTION;  Surgeon: Clydell Hakim, MD;  Location: [facility];  Service: Neurosurgery;  Laterality: N/A;  LUMBAR SPINAL CORD STIMULATOR INSERTION  . SPINE SURGERY  April 2013   Back X's 4  . TOTAL HIP ARTHROPLASTY     right  . WRIST FRACTURE SURGERY Bilateral     FAMILY HISTORY: Family History  Problem Relation Age of Onset  . Heart attack Mother   . Restless legs syndrome Mother   . Heart failure Mother   . Heart disease Mother   . Hypertension Mother   . COPD Father     SOCIAL HISTORY:  Social History   Social History  . Marital status: Married    Spouse name: [name]  . Number of children: 2  . Years of education: HS   Occupational History  .      Homemaker   Social History Main Topics  . Smoking status: Never Smoker  . Smokeless tobacco: Never Used  . Alcohol use No  . Drug use: No  . Sexual activity: No   Other Topics Concern  . Not on file   Social History Narrative   Patient lives at home with her spouse.   Caffeine Use: tea     PHYSICAL EXAM  Vitals:   08/17/16 0825  BP: (!) 107/56  Pulse: 67  Weight: 159 lb 9.6 oz (72.4 kg)     Not recorded     MMSE - Mini Mental State Exam 10/13/2015  Orientation to time 5  Orientation to Place 4  Registration 3  Attention/ Calculation 4  Recall 2  Language- name 2 objects 2  Language- repeat 0  Language- follow 3 step command 3  Language- read & follow direction 1  Write a sentence 1  Copy design 0  Total score 25    Body mass index is 32.24 kg/m.  GENERAL EXAM: General: Patient is awake, alert and in no acute distress.  Well developed and groomed.  MASKED FACIES. Neck: Neck is supple. Cardiovascular: Heart is regular rate and rhythm with no murmurs.  Neurologic Exam  Mental Status: Awake, alert.  Language is fluent and comprehension intact. Cranial Nerves: Pupils are equal and reactive to light.  Visual fields are full to confrontation.  Conjugate eye movements are full and symmetric.  Facial sensation and strength are symmetric.  Hearing is intact.  Palate elevated symmetrically and uvula is midline.  Shoulder shug is symmetric.  Tongue is midline. Motor: Normal bulk and tone; MILD COGWHEELING IN LUE  WITH REINFORCEMENT.  NO REST OR POSTURAL TREMOR.  BRADYKINESIA IN BUE AND BLE (L SLOWER THAN R). Full strength in the upper and lower extremities; EXCEPT LIMITED IN RIGHT ARM DUE  TO RIGHT SHOULDER PAIN Sensory: Intact and symmetric to light touch. Coordination: No ataxia or dysmetria on finger-nose or rapid alternating movement testing. Reflexes: Deep tendon reflexes in the upper extremity are present and symmetric; ABSENT AT KNEES AND ANKLES. Gait and Station: CAUTIOUS GAIT. SLOW TO RISE, AND NEEDS HELP TO STAND UP.  SLOW, DECR ARM SWING. SLOW TURNING. VERY UNSTEADY.    DIAGNOSTIC DATA (LABS, IMAGING, TESTING) - I reviewed patient records, labs, notes, testing and imaging myself where available.  Lab Results  Component Value Date   WBC 8.3 09/02/2015   HGB 10.7 (L) 09/02/2015   HCT 33.8 (L) 09/02/2015   MCV 92.3 09/02/2015   PLT 289 09/02/2015        Component Value Date/Time   NA 141 05/03/2016 0926   K 4.6 05/03/2016 0926   CL 108 05/03/2016 0926   CO2 24 05/03/2016 0926   GLUCOSE 89 05/03/2016 0926   BUN 15 05/03/2016 0926   CREATININE 0.87 05/03/2016 0926   CALCIUM 9.2 05/03/2016 0926   PROT 6.5 05/22/2010 0923   ALBUMIN 3.9 05/22/2010 0923   AST 29 05/22/2010 0923   ALT 22 05/22/2010 0923   ALKPHOS 77 05/22/2010 0923   BILITOT 0.2 (L) 05/22/2010 0923   GFRNONAA 58 (L) 09/02/2015 1631   GFRAA >60 09/02/2015 1631   No results found for: CHOL No results found for: HGBA1C No results found for: VITAMINB12 No results found for: TSH   11/10/10 MRI brain - mild atrophy and minimal chronic small vessel ischemic disease.   ASSESSMENT AND PLAN  82 y.o. female with hypertension, hypercholesterolemia, hypothyroidism, fibromyalgia, restless leg syndrome and parkinson's disease.  More shoulder pain, foot pain, memory loss and gait difficulty. Represents progression of parkinson's disease.  Dx: idiopathic parkinson's disease (akinetic/rigid) + RLS + memory loss (likely related to parkinson's disease)   Parkinson's disease ([place])  Gait difficulty  Memory loss  Neuropathy due to Sjogren's syndrome ([place])  Other dysphagia    PLAN: - continue pramipexole 1mg  TID - continue carb/levo to 2 tabs TID - continue using rolling walker at all times; increase supervision and assistance to avoid falls/injuries - try lidocaine topical cream for foot pain - stay active and continue stimulating activities  Meds ordered this encounter  Medications  . carbidopa-levodopa (SINEMET IR) 25-100 MG tablet    Sig: Take 2 tablets by mouth 3 (three) times daily.    Dispense:  180 tablet    Refill:  12  . pramipexole (MIRAPEX) 1 MG tablet    Sig: Take 1 tablet (1 mg total) by mouth 3 (three) times daily.    Dispense:  90 tablet    Refill:  12   Return in about 6 months (around 02/14/2017).    [name], MD [id number], [id number]  AM Certified in Neurology, Neurophysiology and Neuroimaging  [facility] [address] [place], [place] [address] [phone]

## 2016-08-17 NOTE — Patient Instructions (Addendum)
Thank you for coming to see [place] at [facility]. I hope we have been able to provide you high quality care today.  You may receive a patient satisfaction survey over the next few weeks. We would appreciate your feedback and comments so that we may continue to improve ourselves and the health of our patients.  Consider these websites for more information [employer] (https://www.henry-hernandez.biz/)  [employer] (WindowBlog.ch)  [facility] (Https://www.davisphinneyfoundation.org/)     ~~~~~~~~~~~~~~~~~~~~~~~~~~~~~~~~~~~~~~~~~~~~~~~~~~~~~~~~~~~~~~~~~  DR. Nyomie Ehrlich'S GUIDE TO HAPPY AND HEALTHY LIVING These are some of my general health and wellness recommendations. Some of them may apply to you better than others. Please use common sense as you try these suggestions and feel free to ask me any questions.   ACTIVITY/FITNESS Mental, social, emotional and physical stimulation are very important for brain and body health. Try learning a new activity (arts, music, language, sports, games). Keep moving your body to the best of your abilities.   NUTRITION Eat more plants: colorful vegetables, nuts, seeds and berries.  Eat less sugar, salt, preservatives and processed foods.  Avoid toxins such as cigarettes and alcohol.  Drink water when you are thirsty. Warm water with a slice of lemon is an excellent morning drink to start the day.  Consider these websites for more information [employer] (https://www.henry-hernandez.biz/) [employer] (WindowBlog.ch)   RELAXATION Consider practicing mindfulness meditation or other relaxation techniques such as deep breathing, prayer, yoga, tai chi, massage. See website mindful.org or the apps Headspace or Calm to help get started.   SLEEP Try to get at least 7-8+ hours sleep per day. Regular  exercise and reduced caffeine will help you sleep better. Practice good sleep hygeine techniques. See website sleep.org for more information.   PLANNING Prepare estate planning, living will, healthcare POA documents. Sometimes this is best planned with the help of an attorney. Theconversationproject.org and agingwithdignity.org are excellent resources.

## 2016-08-22 DIAGNOSIS — J019 Acute sinusitis, unspecified: Secondary | ICD-10-CM | POA: Diagnosis not present

## 2016-08-28 DIAGNOSIS — M961 Postlaminectomy syndrome, not elsewhere classified: Secondary | ICD-10-CM | POA: Diagnosis not present

## 2016-08-28 DIAGNOSIS — M545 Low back pain: Secondary | ICD-10-CM | POA: Diagnosis not present

## 2016-08-29 DIAGNOSIS — L568 Other specified acute skin changes due to ultraviolet radiation: Secondary | ICD-10-CM | POA: Diagnosis not present

## 2016-08-29 DIAGNOSIS — D485 Neoplasm of uncertain behavior of skin: Secondary | ICD-10-CM | POA: Diagnosis not present

## 2016-08-29 DIAGNOSIS — L570 Actinic keratosis: Secondary | ICD-10-CM | POA: Diagnosis not present

## 2016-09-04 DIAGNOSIS — Z1231 Encounter for screening mammogram for malignant neoplasm of breast: Secondary | ICD-10-CM | POA: Diagnosis not present

## 2016-09-10 DIAGNOSIS — Z23 Encounter for immunization: Secondary | ICD-10-CM | POA: Diagnosis not present

## 2016-09-12 DIAGNOSIS — M7061 Trochanteric bursitis, right hip: Secondary | ICD-10-CM | POA: Diagnosis not present

## 2016-09-12 DIAGNOSIS — M7062 Trochanteric bursitis, left hip: Secondary | ICD-10-CM | POA: Diagnosis not present

## 2016-09-12 DIAGNOSIS — M25551 Pain in right hip: Secondary | ICD-10-CM | POA: Diagnosis not present

## 2016-09-12 DIAGNOSIS — M25552 Pain in left hip: Secondary | ICD-10-CM | POA: Diagnosis not present

## 2016-09-13 DIAGNOSIS — Z9689 Presence of other specified functional implants: Secondary | ICD-10-CM | POA: Diagnosis not present

## 2016-09-13 DIAGNOSIS — Z6832 Body mass index (BMI) 32.0-32.9, adult: Secondary | ICD-10-CM | POA: Diagnosis not present

## 2016-09-13 DIAGNOSIS — M961 Postlaminectomy syndrome, not elsewhere classified: Secondary | ICD-10-CM | POA: Diagnosis not present

## 2016-09-13 DIAGNOSIS — M545 Low back pain: Secondary | ICD-10-CM | POA: Diagnosis not present

## 2016-09-19 DIAGNOSIS — N200 Calculus of kidney: Secondary | ICD-10-CM | POA: Diagnosis not present

## 2016-09-19 DIAGNOSIS — M810 Age-related osteoporosis without current pathological fracture: Secondary | ICD-10-CM | POA: Diagnosis not present

## 2016-09-24 DIAGNOSIS — H903 Sensorineural hearing loss, bilateral: Secondary | ICD-10-CM | POA: Insufficient documentation

## 2016-09-24 DIAGNOSIS — H6123 Impacted cerumen, bilateral: Secondary | ICD-10-CM | POA: Diagnosis not present

## 2016-09-24 DIAGNOSIS — M2669 Other specified disorders of temporomandibular joint: Secondary | ICD-10-CM | POA: Diagnosis not present

## 2016-10-09 ENCOUNTER — Encounter: Payer: Self-pay | Admitting: Emergency Medicine

## 2016-10-09 ENCOUNTER — Ambulatory Visit (INDEPENDENT_AMBULATORY_CARE_PROVIDER_SITE_OTHER): Payer: Medicare Other | Admitting: Emergency Medicine

## 2016-10-09 DIAGNOSIS — R0602 Shortness of breath: Secondary | ICD-10-CM

## 2016-10-09 DIAGNOSIS — I779 Disorder of arteries and arterioles, unspecified: Secondary | ICD-10-CM | POA: Diagnosis not present

## 2016-10-09 MED ORDER — ALBUTEROL SULFATE HFA 108 (90 BASE) MCG/ACT IN AERS: 2.0000 | INHALATION_SPRAY | RESPIRATORY_TRACT | 5 refills | Status: DC | PRN

## 2016-10-09 NOTE — Patient Instructions (Signed)
We will try using albuterol 2 puffs through a spacer before exertion. You may use up to every 4 hours if needed for shortness of breath.  We need to slowly increase your exercise to build up your stamina and endurance.  Follow with Dr Lamonte Sakai in 2 months or sooner if you have any problems.

## 2016-10-09 NOTE — Addendum Note (Signed)
Addended by: Desmond Dike C on: 10/09/2016 09:35 AM   Modules accepted: Orders

## 2016-10-09 NOTE — Assessment & Plan Note (Signed)
Continued dyspnea. She states that it is quite similar to her previous visits. She did have a suggestion of possible mild obstruction on spirometry from the past but her raw numbers were normal. I suspect that her shortness of breath is due to deconditioning, her weight, with her superimposed diastolic dysfunction. Her exercise tolerance is impacted by her peripheral vascular disease and deconditioning is probably the biggest issue here. I talked her about slowly try to build up her endurance and conditioning. I think it would be possible to try short-acting bronchodilator to see if that benefits her when she tries to get up and walk. If there is no benefit we will discontinue this. She does not believe she can do repeat pulmonary function testing for me.

## 2016-10-09 NOTE — Progress Notes (Signed)
Subjective:    Patient ID: [name], female    DOB: February 21, 1931, 82 y.o.   MRN: [id number]  Shortness of Breath  Pertinent negatives include no ear pain, fever, headaches, leg swelling, rash, rhinorrhea, sore throat, vomiting or wheezing.   82 yo never smoker, hx of Hiatal hernia, sjogren's, hypothyroidism, Parkinson's, PVD of LLE, diastolic dysfxn, allergies. Also with OA s/p hip replacement and multiple back surgeries. She has had longstanding exertional SOB that goes back for years. She has a significant 2nd hand tobacco exposure. She doesn't hear wheezing but she does have cough > a dry, non-productive. She is on Dexilant - controls her GERD sx.   Chemical nuclear stress test 2014 > normal.   ROV 05/25/14 -- follow up for dyspnea.  Underwent PFT today > normal AF with ?? Some evidence on FV loop for obstruction.  No new issues since last time.  OV to re-establish care 10/09/16 -- 82 yo obese never smoker with a hx HTN and dCHF, hiatal hernia / GERD, parkinson's, allergic rhinitis, PVD. She returns today for evaluation of dyspnea. She reports that she is comfortable at rest. She has tachypnea and inability to take a deep breath. This will happen when walking about 1/4 mile. This is quite similar to our previous visits, not necessary worse.  She does also have leg aching w exertion.  No cough, no wheeze, no chest tightness. No congestion. She does have GERD, adequately treated w dexilant. Her wt has been stable. PFT done 05/2014 were reviewed > spirometry shows normal airflows, possible curve of the flow volume loop to suggest mild obstruction. Do not see lung volumes or DLCO.  TTE from 05/23/16 > LVEF 50-55%, mild AS, diastolic dysfxn, normal RV, estimated PASP 3mmHg. She has an implanted pain pump for back and hip pain.     Review of Systems  Constitutional: Negative for fever and unexpected weight change.  HENT: Negative for congestion, dental problem, ear pain, nosebleeds, postnasal  drip, rhinorrhea, sinus pressure, sneezing, sore throat and trouble swallowing.   Eyes: Negative for redness and itching.  Respiratory: Positive for shortness of breath. Negative for cough, chest tightness and wheezing.   Cardiovascular: Negative for palpitations and leg swelling.  Gastrointestinal: Negative for abdominal distention, nausea and vomiting.  Genitourinary: Negative for dysuria.  Musculoskeletal: Positive for joint swelling.       Pain and stiffness  Skin: Negative for rash.  Neurological: Positive for weakness. Negative for headaches.  Hematological: Does not bruise/bleed easily.  Psychiatric/Behavioral: Negative for dysphoric mood. The patient is not nervous/anxious.    Past Medical History:  Diagnosis Date  . Anemia   . Anxiety   . Arthritis   . Broken arm    right   . Chronic diastolic CHF (congestive heart failure) ([place])   . Family history of adverse reaction to anesthesia    pt. states sister vomits  . Fibromyalgia   . Fracture of thumb 09/01/2015   right  . Fracture, foot 09/01/2015   right  . GERD (gastroesophageal reflux disease)   . H/O hiatal hernia   . Heart murmur    AV sclerosis by echo  . History of bronchitis   . History of kidney stones   . Hypercholesterolemia   . Hypertension    dr t turner  . Hypothyroidism   . Neuropathy ([place])   . Osteoporosis   . Parkinson disease ([place])   . PVD (peripheral vascular disease) ([facility])    99% stenosis of left  anteiror tibial artery, mod stenosis of left distal SFA and popliteal artery followed by [name]  . Restless leg   . Shortness of breath   . Sjogren's disease ([place])   . SOB (shortness of breath)    chronic due to diastolic dysfunction, deconditioning, obesity  . Tremor      Family History  Problem Relation Age of Onset  . Heart attack Mother   . Restless legs syndrome Mother   . Heart failure Mother   . Heart disease Mother   . Hypertension Mother   . COPD Father      Social History    Social History  . Marital status: Married    Spouse name: [name]  . Number of children: 2  . Years of education: HS   Occupational History  .      Homemaker   Social History Main Topics  . Smoking status: Never Smoker  . Smokeless tobacco: Never Used  . Alcohol use No  . Drug use: No  . Sexual activity: No   Other Topics Concern  . Not on file   Social History Narrative   Patient lives at home with her spouse.   Caffeine Use: tea     Allergies  Allergen Reactions  . Codeine Other (See Comments)    Headache Severe nightmares  "will not take it"  . Lactose Intolerance (Gi) Nausea And Vomiting and Other (See Comments)    Reactions to meds that contain lactose derivitives and foods  . Lyrica [Pregabalin] Other (See Comments)    Leg edema   . Other Other (See Comments)    Pain meds received after surgery caused nightmares (Percocet,Hydrocodone,Morphine)  . Plaquenil [Hydroxychloroquine Sulfate] Other (See Comments)    Stomach upset  . Reglan [Metoclopramide] Other (See Comments)    Stomach upset  . Requip [Ropinirole Hcl] Other (See Comments)    Stomach upset   . Septra [Sulfamethoxazole-Trimethoprim] Nausea And Vomiting  . Shellfish Allergy Nausea And Vomiting    Upset stomach  . Latex Rash    Redness and sore (steri strips under a cast caused a rash spring 2016)     Outpatient Medications Prior to Visit  Medication Sig Dispense Refill  . aspirin EC 81 MG tablet Take 81 mg by mouth daily.    . Calcium Carb-Cholecalciferol (CALCIUM + D3 PO) Take by mouth. Calcium 1000 mg    . carbidopa-levodopa (SINEMET IR) 25-100 MG tablet Take 2 tablets by mouth 3 (three) times daily. 180 tablet 12  . celecoxib (CELEBREX) 200 MG capsule Take 200 mg by mouth 2 (two) times daily.    . Cholecalciferol (VITAMIN D3) 2000 UNITS TABS Take 2,000 Units by mouth daily.     . Coenzyme Q10 100 MG TABS Take 100 mg by mouth daily.    Marland Kitchen denosumab (PROLIA) 60 MG/ML SOLN injection  Inject 60 mg into the skin every 6 (six) months. Administer in upper arm, thigh, or abdomen    . dexlansoprazole (DEXILANT) 60 MG capsule Take 60 mg by mouth daily.    . diphenhydramine-acetaminophen (TYLENOL PM) 25-500 MG TABS Take 1 tablet by mouth at bedtime.    . docusate sodium (COLACE) 100 MG capsule Take 100 mg by mouth at bedtime.    . DULoxetine (CYMBALTA) 20 MG capsule TAKE (1) CAPSULE DAILY. 90 capsule 4  . ezetimibe (ZETIA) 10 MG tablet Take 10 mg by mouth at bedtime.     . furosemide (LASIX) 20 MG tablet Take 20 mg by  mouth as needed.    . hydroxychloroquine (PLAQUENIL) 200 MG tablet Take 200 mg by mouth daily. Reported on 06/28/2016  0  . hypromellose (SYSTANE OVERNIGHT THERAPY) 0.3 % GEL ophthalmic ointment Place 1 application into both eyes at bedtime.    Marland Kitchen levothyroxine (SYNTHROID, LEVOTHROID) 50 MCG tablet Take 50 mcg by mouth daily.    Marland Kitchen lidocaine (XYLOCAINE) 5 % ointment Apply 1 application topically as needed. 02/16/16 script faxed to [facility], 240 gm, 12 refills    . lovastatin (MEVACOR) 40 MG tablet Take 40 mg by mouth at bedtime.    . metoprolol tartrate (LOPRESSOR) 25 MG tablet Take 1 tablet (25 mg total) by mouth daily. 90 tablet 3  . Multiple Vitamin (MULTIVITAMIN WITH MINERALS) TABS tablet Take 1 tablet by mouth daily.    . NONFORMULARY OR COMPOUNDED ITEM Apply 1-2 g topically 3 (three) times daily. 120 each 3  . Polyethyl Glycol-Propyl Glycol (SYSTANE) 0.4-0.3 % SOLN Place 1 drop into both eyes daily.     . pramipexole (MIRAPEX) 1 MG tablet Take 1 tablet (1 mg total) by mouth 3 (three) times daily. 90 tablet 12  . Skin Protectants, Misc. (EUCERIN) cream Apply 1 application topically as needed (Applies under breast and arm for skin irritation.).    Marland Kitchen traMADol (ULTRAM) 50 MG tablet Take 50 mg by mouth See admin instructions. Take 1 tablet (50 mg) by mouth every morning with 200 mg ER tablet, may take an additional tablet (50 mg) two more times daily as needed for pain    .  traMADol (ULTRAM-ER) 200 MG 24 hr tablet Take 200 mg by mouth See admin instructions. Take 1 tablet (200 mg ER) by mouth every morning with a 50 mg tablet    . diltiazem (CARDIZEM) 120 MG tablet Take 120 mg by mouth daily.    . potassium citrate (UROCIT-K) 10 MEQ (1080 MG) SR tablet Take 10 mEq by mouth daily.    Marland Kitchen albuterol (PROAIR HFA) 108 (90 BASE) MCG/ACT inhaler Inhale 2 puffs into the lungs every 4 (four) hours as needed for wheezing or shortness of breath.     No facility-administered medications prior to visit.         Objective:   Physical Exam Vitals:   10/09/16 0911  BP: 112/68  BP Location: Left Arm  Cuff Size: Normal  Pulse: 66  SpO2: 99%  Weight: 163 lb (73.9 kg)  Height: 4\' 10"  (1.473 m)   Gen: Pleasant, elderly woman, in no distress,  normal affect  ENT: No lesions,  mouth clear,  oropharynx clear, no postnasal drip  Neck: No JVD, no TMG, no carotid bruits  Lungs: No use of accessory muscles, clear without rales or rhonchi  Cardiovascular: RRR, heart sounds normal, no murmur or gallops, no peripheral edema  Musculoskeletal: No deformities, no cyanosis or clubbing  Neuro: alert, non focal  Skin: Warm, no lesions or rashes   TTE 05/06/14 --  - Left ventricle: The cavity size was normal. Wall thickness was normal. Systolic function was normal. The estimated ejection fraction was in the range of 60% to 65%. Wall motion was normal; there were no regional wall motion abnormalities. Doppler parameters are consistent with abnormal left ventricular relaxation (grade 1 diastolic dysfunction). LV filling pressure is indeterminate. - Aortic valve: Mildly calcified leaflets. Transvalvular velocity was minimally increased. There was no stenosis. There was no regurgitation. - Mitral valve: Structurally normal valve. There was trace to mild regurgitation. - Left atrium: The atrium was normal in  size. - Atrial septum: There was increased thickness of the  septum, consistent with lipomatous hypertrophy. - Pulmonary arteries: PA peak pressure: 19 mm Hg (S). Impressions: - LVEF 123456, mild diastolic dysfuction, indeterminate LV filling pressure. Mildly calcified aortic valve with elevated velocity, but no clear stenosis. Trace to mild MR.     Assessment & Plan:  SOB (shortness of breath) Continued dyspnea. She states that it is quite similar to her previous visits. She did have a suggestion of possible mild obstruction on spirometry from the past but her raw numbers were normal. I suspect that her shortness of breath is due to deconditioning, her weight, with her superimposed diastolic dysfunction. Her exercise tolerance is impacted by her peripheral vascular disease and deconditioning is probably the biggest issue here. I talked her about slowly try to build up her endurance and conditioning. I think it would be possible to try short-acting bronchodilator to see if that benefits her when she tries to get up and walk. If there is no benefit we will discontinue this. She does not believe she can do repeat pulmonary function testing for me.  Baltazar Apo, MD, PhD 10/09/2016, 9:32 AM [facility] [phone] or if no answer [phone]

## 2016-10-09 NOTE — Progress Notes (Signed)
Patient seen in the office today and instructed on use of Albuterol HFA.  Patient expressed understanding and demonstrated technique.  

## 2016-10-11 DIAGNOSIS — M79641 Pain in right hand: Secondary | ICD-10-CM | POA: Diagnosis not present

## 2016-10-11 DIAGNOSIS — Z79899 Other long term (current) drug therapy: Secondary | ICD-10-CM | POA: Diagnosis not present

## 2016-10-11 DIAGNOSIS — M154 Erosive (osteo)arthritis: Secondary | ICD-10-CM | POA: Diagnosis not present

## 2016-10-11 DIAGNOSIS — M3500 Sicca syndrome, unspecified: Secondary | ICD-10-CM | POA: Diagnosis not present

## 2016-10-11 DIAGNOSIS — M545 Low back pain: Secondary | ICD-10-CM | POA: Diagnosis not present

## 2016-10-11 DIAGNOSIS — M79642 Pain in left hand: Secondary | ICD-10-CM | POA: Diagnosis not present

## 2016-10-16 DIAGNOSIS — M25531 Pain in right wrist: Secondary | ICD-10-CM | POA: Diagnosis not present

## 2016-10-16 DIAGNOSIS — M79601 Pain in right arm: Secondary | ICD-10-CM | POA: Diagnosis not present

## 2016-10-16 DIAGNOSIS — M25511 Pain in right shoulder: Secondary | ICD-10-CM | POA: Diagnosis not present

## 2016-10-16 DIAGNOSIS — R296 Repeated falls: Secondary | ICD-10-CM | POA: Diagnosis not present

## 2016-10-16 DIAGNOSIS — R2681 Unsteadiness on feet: Secondary | ICD-10-CM | POA: Diagnosis not present

## 2016-10-16 DIAGNOSIS — R278 Other lack of coordination: Secondary | ICD-10-CM | POA: Diagnosis not present

## 2016-10-16 DIAGNOSIS — M6281 Muscle weakness (generalized): Secondary | ICD-10-CM | POA: Diagnosis not present

## 2016-10-16 DIAGNOSIS — R29898 Other symptoms and signs involving the musculoskeletal system: Secondary | ICD-10-CM | POA: Diagnosis not present

## 2016-10-16 DIAGNOSIS — M545 Low back pain: Secondary | ICD-10-CM | POA: Diagnosis not present

## 2016-10-16 DIAGNOSIS — M25512 Pain in left shoulder: Secondary | ICD-10-CM | POA: Diagnosis not present

## 2016-10-16 DIAGNOSIS — G20 Parkinson's disease: Secondary | ICD-10-CM | POA: Diagnosis not present

## 2016-10-18 DIAGNOSIS — M545 Low back pain: Secondary | ICD-10-CM | POA: Diagnosis not present

## 2016-10-18 DIAGNOSIS — M6281 Muscle weakness (generalized): Secondary | ICD-10-CM | POA: Diagnosis not present

## 2016-10-18 DIAGNOSIS — G20 Parkinson's disease: Secondary | ICD-10-CM | POA: Diagnosis not present

## 2016-10-18 DIAGNOSIS — M25512 Pain in left shoulder: Secondary | ICD-10-CM | POA: Diagnosis not present

## 2016-10-18 DIAGNOSIS — R296 Repeated falls: Secondary | ICD-10-CM | POA: Diagnosis not present

## 2016-10-18 DIAGNOSIS — M25511 Pain in right shoulder: Secondary | ICD-10-CM | POA: Diagnosis not present

## 2016-10-19 ENCOUNTER — Other Ambulatory Visit: Payer: Self-pay | Admitting: Cardiology

## 2016-10-22 DIAGNOSIS — R296 Repeated falls: Secondary | ICD-10-CM | POA: Diagnosis not present

## 2016-10-22 DIAGNOSIS — M25512 Pain in left shoulder: Secondary | ICD-10-CM | POA: Diagnosis not present

## 2016-10-22 DIAGNOSIS — M6281 Muscle weakness (generalized): Secondary | ICD-10-CM | POA: Diagnosis not present

## 2016-10-22 DIAGNOSIS — G20 Parkinson's disease: Secondary | ICD-10-CM | POA: Diagnosis not present

## 2016-10-22 DIAGNOSIS — M25511 Pain in right shoulder: Secondary | ICD-10-CM | POA: Diagnosis not present

## 2016-10-22 DIAGNOSIS — M545 Low back pain: Secondary | ICD-10-CM | POA: Diagnosis not present

## 2016-10-25 DIAGNOSIS — M545 Low back pain: Secondary | ICD-10-CM | POA: Diagnosis not present

## 2016-10-25 DIAGNOSIS — M25512 Pain in left shoulder: Secondary | ICD-10-CM | POA: Diagnosis not present

## 2016-10-25 DIAGNOSIS — M6281 Muscle weakness (generalized): Secondary | ICD-10-CM | POA: Diagnosis not present

## 2016-10-25 DIAGNOSIS — R296 Repeated falls: Secondary | ICD-10-CM | POA: Diagnosis not present

## 2016-10-25 DIAGNOSIS — G20 Parkinson's disease: Secondary | ICD-10-CM | POA: Diagnosis not present

## 2016-10-25 DIAGNOSIS — M25511 Pain in right shoulder: Secondary | ICD-10-CM | POA: Diagnosis not present

## 2016-10-26 NOTE — Addendum Note (Signed)
Addended by: Lianne Cure A on: 10/26/2016 02:10 PM   Modules accepted: Orders

## 2016-11-06 DIAGNOSIS — R296 Repeated falls: Secondary | ICD-10-CM | POA: Diagnosis not present

## 2016-11-06 DIAGNOSIS — M545 Low back pain: Secondary | ICD-10-CM | POA: Diagnosis not present

## 2016-11-06 DIAGNOSIS — M6281 Muscle weakness (generalized): Secondary | ICD-10-CM | POA: Diagnosis not present

## 2016-11-06 DIAGNOSIS — G20 Parkinson's disease: Secondary | ICD-10-CM | POA: Diagnosis not present

## 2016-11-06 DIAGNOSIS — M25512 Pain in left shoulder: Secondary | ICD-10-CM | POA: Diagnosis not present

## 2016-11-06 DIAGNOSIS — M25511 Pain in right shoulder: Secondary | ICD-10-CM | POA: Diagnosis not present

## 2016-11-09 DIAGNOSIS — M545 Low back pain: Secondary | ICD-10-CM | POA: Diagnosis not present

## 2016-11-09 DIAGNOSIS — M25511 Pain in right shoulder: Secondary | ICD-10-CM | POA: Diagnosis not present

## 2016-11-09 DIAGNOSIS — M6281 Muscle weakness (generalized): Secondary | ICD-10-CM | POA: Diagnosis not present

## 2016-11-09 DIAGNOSIS — R296 Repeated falls: Secondary | ICD-10-CM | POA: Diagnosis not present

## 2016-11-09 DIAGNOSIS — R278 Other lack of coordination: Secondary | ICD-10-CM | POA: Diagnosis not present

## 2016-11-09 DIAGNOSIS — G20 Parkinson's disease: Secondary | ICD-10-CM | POA: Diagnosis not present

## 2016-11-09 DIAGNOSIS — R2681 Unsteadiness on feet: Secondary | ICD-10-CM | POA: Diagnosis not present

## 2016-11-09 DIAGNOSIS — M25531 Pain in right wrist: Secondary | ICD-10-CM | POA: Diagnosis not present

## 2016-11-09 DIAGNOSIS — R29898 Other symptoms and signs involving the musculoskeletal system: Secondary | ICD-10-CM | POA: Diagnosis not present

## 2016-11-09 DIAGNOSIS — M25512 Pain in left shoulder: Secondary | ICD-10-CM | POA: Diagnosis not present

## 2016-11-09 DIAGNOSIS — M79601 Pain in right arm: Secondary | ICD-10-CM | POA: Diagnosis not present

## 2016-11-13 DIAGNOSIS — R296 Repeated falls: Secondary | ICD-10-CM | POA: Diagnosis not present

## 2016-11-13 DIAGNOSIS — M25511 Pain in right shoulder: Secondary | ICD-10-CM | POA: Diagnosis not present

## 2016-11-13 DIAGNOSIS — M6281 Muscle weakness (generalized): Secondary | ICD-10-CM | POA: Diagnosis not present

## 2016-11-13 DIAGNOSIS — G20 Parkinson's disease: Secondary | ICD-10-CM | POA: Diagnosis not present

## 2016-11-13 DIAGNOSIS — M545 Low back pain: Secondary | ICD-10-CM | POA: Diagnosis not present

## 2016-11-13 DIAGNOSIS — M25512 Pain in left shoulder: Secondary | ICD-10-CM | POA: Diagnosis not present

## 2016-11-16 DIAGNOSIS — G20 Parkinson's disease: Secondary | ICD-10-CM | POA: Diagnosis not present

## 2016-11-16 DIAGNOSIS — M25512 Pain in left shoulder: Secondary | ICD-10-CM | POA: Diagnosis not present

## 2016-11-16 DIAGNOSIS — R296 Repeated falls: Secondary | ICD-10-CM | POA: Diagnosis not present

## 2016-11-16 DIAGNOSIS — M545 Low back pain: Secondary | ICD-10-CM | POA: Diagnosis not present

## 2016-11-16 DIAGNOSIS — M6281 Muscle weakness (generalized): Secondary | ICD-10-CM | POA: Diagnosis not present

## 2016-11-16 DIAGNOSIS — M25511 Pain in right shoulder: Secondary | ICD-10-CM | POA: Diagnosis not present

## 2016-11-20 DIAGNOSIS — M25512 Pain in left shoulder: Secondary | ICD-10-CM | POA: Diagnosis not present

## 2016-11-20 DIAGNOSIS — R296 Repeated falls: Secondary | ICD-10-CM | POA: Diagnosis not present

## 2016-11-20 DIAGNOSIS — M545 Low back pain: Secondary | ICD-10-CM | POA: Diagnosis not present

## 2016-11-20 DIAGNOSIS — M25511 Pain in right shoulder: Secondary | ICD-10-CM | POA: Diagnosis not present

## 2016-11-20 DIAGNOSIS — G20 Parkinson's disease: Secondary | ICD-10-CM | POA: Diagnosis not present

## 2016-11-20 DIAGNOSIS — M6281 Muscle weakness (generalized): Secondary | ICD-10-CM | POA: Diagnosis not present

## 2016-11-21 ENCOUNTER — Ambulatory Visit: Payer: Self-pay | Admitting: Cardiology

## 2016-11-21 DIAGNOSIS — E039 Hypothyroidism, unspecified: Secondary | ICD-10-CM | POA: Diagnosis not present

## 2016-11-21 DIAGNOSIS — R232 Flushing: Secondary | ICD-10-CM | POA: Diagnosis not present

## 2016-11-21 DIAGNOSIS — R42 Dizziness and giddiness: Secondary | ICD-10-CM | POA: Diagnosis not present

## 2016-11-22 DIAGNOSIS — L905 Scar conditions and fibrosis of skin: Secondary | ICD-10-CM | POA: Diagnosis not present

## 2016-11-27 DIAGNOSIS — M545 Low back pain: Secondary | ICD-10-CM | POA: Diagnosis not present

## 2016-11-27 DIAGNOSIS — R296 Repeated falls: Secondary | ICD-10-CM | POA: Diagnosis not present

## 2016-11-27 DIAGNOSIS — G20 Parkinson's disease: Secondary | ICD-10-CM | POA: Diagnosis not present

## 2016-11-27 DIAGNOSIS — M25511 Pain in right shoulder: Secondary | ICD-10-CM | POA: Diagnosis not present

## 2016-11-27 DIAGNOSIS — M6281 Muscle weakness (generalized): Secondary | ICD-10-CM | POA: Diagnosis not present

## 2016-11-27 DIAGNOSIS — M25512 Pain in left shoulder: Secondary | ICD-10-CM | POA: Diagnosis not present

## 2016-12-06 DIAGNOSIS — M79631 Pain in right forearm: Secondary | ICD-10-CM | POA: Diagnosis not present

## 2016-12-06 DIAGNOSIS — S20211A Contusion of right front wall of thorax, initial encounter: Secondary | ICD-10-CM | POA: Diagnosis not present

## 2016-12-18 ENCOUNTER — Ambulatory Visit: Payer: Self-pay | Admitting: Emergency Medicine

## 2016-12-19 DIAGNOSIS — M79641 Pain in right hand: Secondary | ICD-10-CM | POA: Diagnosis not present

## 2016-12-19 DIAGNOSIS — Z9181 History of falling: Secondary | ICD-10-CM | POA: Diagnosis not present

## 2016-12-19 DIAGNOSIS — N6489 Other specified disorders of breast: Secondary | ICD-10-CM | POA: Diagnosis not present

## 2016-12-20 ENCOUNTER — Other Ambulatory Visit: Payer: Self-pay | Admitting: Family Medicine

## 2016-12-20 DIAGNOSIS — N644 Mastodynia: Secondary | ICD-10-CM

## 2016-12-20 DIAGNOSIS — N6489 Other specified disorders of breast: Secondary | ICD-10-CM

## 2016-12-21 ENCOUNTER — Other Ambulatory Visit: Payer: Self-pay | Admitting: Family Medicine

## 2016-12-21 DIAGNOSIS — N6489 Other specified disorders of breast: Secondary | ICD-10-CM

## 2016-12-21 DIAGNOSIS — N644 Mastodynia: Secondary | ICD-10-CM

## 2016-12-24 ENCOUNTER — Other Ambulatory Visit: Payer: Self-pay | Admitting: Family Medicine

## 2016-12-24 ENCOUNTER — Ambulatory Visit
Admission: RE | Admit: 2016-12-24 | Discharge: 2016-12-24 | Disposition: A | Discharge status: Home / Self Care | Payer: Medicare Other | Source: Ambulatory Visit | Attending: Family Medicine | Admitting: Family Medicine

## 2016-12-24 DIAGNOSIS — M79641 Pain in right hand: Secondary | ICD-10-CM

## 2016-12-24 DIAGNOSIS — S62616A Displaced fracture of proximal phalanx of right little finger, initial encounter for closed fracture: Secondary | ICD-10-CM | POA: Diagnosis not present

## 2016-12-26 ENCOUNTER — Other Ambulatory Visit: Payer: Self-pay

## 2016-12-27 ENCOUNTER — Ambulatory Visit: Payer: Self-pay | Admitting: Cardiology

## 2016-12-28 ENCOUNTER — Ambulatory Visit
Admission: RE | Admit: 2016-12-28 | Discharge: 2016-12-28 | Disposition: A | Discharge status: Home / Self Care | Payer: Medicare Other | Source: Ambulatory Visit | Attending: Family Medicine | Admitting: Family Medicine

## 2016-12-28 DIAGNOSIS — N644 Mastodynia: Secondary | ICD-10-CM

## 2016-12-28 DIAGNOSIS — S62646A Nondisplaced fracture of proximal phalanx of right little finger, initial encounter for closed fracture: Secondary | ICD-10-CM | POA: Diagnosis not present

## 2016-12-28 DIAGNOSIS — R928 Other abnormal and inconclusive findings on diagnostic imaging of breast: Secondary | ICD-10-CM | POA: Diagnosis not present

## 2016-12-28 DIAGNOSIS — N6489 Other specified disorders of breast: Secondary | ICD-10-CM

## 2016-12-28 DIAGNOSIS — N6011 Diffuse cystic mastopathy of right breast: Secondary | ICD-10-CM | POA: Diagnosis not present

## 2017-01-01 ENCOUNTER — Encounter: Payer: Self-pay | Admitting: Emergency Medicine

## 2017-01-01 ENCOUNTER — Ambulatory Visit (INDEPENDENT_AMBULATORY_CARE_PROVIDER_SITE_OTHER): Payer: Medicare Other | Admitting: Emergency Medicine

## 2017-01-01 DIAGNOSIS — R0602 Shortness of breath: Secondary | ICD-10-CM

## 2017-01-01 NOTE — Progress Notes (Signed)
Subjective:    Patient ID: [name], female    DOB: 1931-08-01, 83 y.o.   MRN: [id number]  Shortness of Breath  Pertinent negatives include no ear pain, fever, headaches, leg swelling, rash, rhinorrhea, sore throat, vomiting or wheezing.   83 yo never smoker, hx of Hiatal hernia, sjogren's, hypothyroidism, Parkinson's, PVD of LLE, diastolic dysfxn, allergies. Also with OA s/p hip replacement and multiple back surgeries. She has had longstanding exertional SOB that goes back for years. She has a significant 2nd hand tobacco exposure. She doesn't hear wheezing but she does have cough > a dry, non-productive. She is on Dexilant - controls her GERD sx.   Chemical nuclear stress test 2014 > normal.   ROV 05/25/14 -- follow up for dyspnea.  Underwent PFT today > normal AF with ?? Some evidence on FV loop for obstruction.  No new issues since last time.  OV to re-establish care 10/09/16 -- 83 yo obese never smoker with a hx HTN and dCHF, hiatal hernia / GERD, parkinson's, allergic rhinitis, PVD. She returns today for evaluation of dyspnea. She reports that she is comfortable at rest. She has tachypnea and inability to take a deep breath. This will happen when walking about 1/4 mile. This is quite similar to our previous visits, not necessary worse.  She does also have leg aching w exertion.  No cough, no wheeze, no chest tightness. No congestion. She does have GERD, adequately treated w dexilant. Her wt has been stable. PFT done 05/2014 were reviewed > spirometry shows normal airflows, possible curve of the flow volume loop to suggest mild obstruction. Do not see lung volumes or DLCO.  TTE from 05/23/16 > LVEF 50-55%, mild AS, diastolic dysfxn, normal RV, estimated PASP 37mmHg. She has an implanted pain pump for back and hip pain.   ROV 01/01/17 -- This follow-up visit for patient with a history of exertional dyspnea in the setting of obesity, restrictive disease from hiatal hernia, GERD, allergic  rhinitis, diastolic CHF. At her last visit we started albuterol to be used as needed for possible obstructive disease noted on pulmonary function testing from June 2017.  She has had a fall since last time, has a shuffling gait. Last time we did a trial of albuterol to see if she would benefit - she isn't sure that it helped her in any way.     Review of Systems  Constitutional: Negative for fever and unexpected weight change.  HENT: Negative for congestion, dental problem, ear pain, nosebleeds, postnasal drip, rhinorrhea, sinus pressure, sneezing, sore throat and trouble swallowing.   Eyes: Negative for redness and itching.  Respiratory: Positive for shortness of breath. Negative for cough, chest tightness and wheezing.   Cardiovascular: Negative for palpitations and leg swelling.  Gastrointestinal: Negative for abdominal distention, nausea and vomiting.  Genitourinary: Negative for dysuria.  Musculoskeletal: Positive for joint swelling.       Pain and stiffness  Skin: Negative for rash.  Neurological: Positive for weakness. Negative for headaches.  Hematological: Does not bruise/bleed easily.  Psychiatric/Behavioral: Negative for dysphoric mood. The patient is not nervous/anxious.    Past Medical History:  Diagnosis Date  . Anemia   . Anxiety   . Arthritis   . Broken arm    right   . Chronic diastolic CHF (congestive heart failure) ([place])   . Family history of adverse reaction to anesthesia    pt. states sister vomits  . Fibromyalgia   . Fracture of thumb 09/01/2015  right  . Fracture, foot 09/01/2015   right  . GERD (gastroesophageal reflux disease)   . H/O hiatal hernia   . Heart murmur    AV sclerosis by echo  . History of bronchitis   . History of kidney stones   . Hypercholesterolemia   . Hypertension    dr t turner  . Hypothyroidism   . Neuropathy ([place])   . Osteoporosis   . Parkinson disease ([place])   . PVD (peripheral vascular disease) ([facility])    99% stenosis of  left anteiror tibial artery, mod stenosis of left distal SFA and popliteal artery followed by [name]  . Restless leg   . Shortness of breath   . Sjogren's disease ([place])   . SOB (shortness of breath)    chronic due to diastolic dysfunction, deconditioning, obesity  . Tremor      Family History  Problem Relation Age of Onset  . Heart attack Mother   . Restless legs syndrome Mother   . Heart failure Mother   . Heart disease Mother   . Hypertension Mother   . COPD Father      Social History   Social History  . Marital status: Married    Spouse name: [name]  . Number of children: 2  . Years of education: HS   Occupational History  .      Homemaker   Social History Main Topics  . Smoking status: Never Smoker  . Smokeless tobacco: Never Used  . Alcohol use No  . Drug use: No  . Sexual activity: No   Other Topics Concern  . Not on file   Social History Narrative   Patient lives at home with her spouse.   Caffeine Use: tea     Allergies  Allergen Reactions  . Codeine Other (See Comments)    Headache Severe nightmares  "will not take it"  . Lactose Intolerance (Gi) Nausea And Vomiting and Other (See Comments)    Reactions to meds that contain lactose derivitives and foods  . Lyrica [Pregabalin] Other (See Comments)    Leg edema   . Other Other (See Comments)    Pain meds received after surgery caused nightmares (Percocet,Hydrocodone,Morphine)  . Plaquenil [Hydroxychloroquine Sulfate] Other (See Comments)    Stomach upset  . Reglan [Metoclopramide] Other (See Comments)    Stomach upset  . Requip [Ropinirole Hcl] Other (See Comments)    Stomach upset   . Septra [Sulfamethoxazole-Trimethoprim] Nausea And Vomiting  . Shellfish Allergy Nausea And Vomiting    Upset stomach  . Latex Rash    Redness and sore (steri strips under a cast caused a rash spring 2016)     Outpatient Medications Prior to Visit  Medication Sig Dispense Refill  . albuterol  (PROAIR HFA) 108 (90 Base) MCG/ACT inhaler Inhale 2 puffs into the lungs every 4 (four) hours as needed for wheezing or shortness of breath. 1 Inhaler 5  . aspirin EC 81 MG tablet Take 81 mg by mouth daily.    . Calcium Carb-Cholecalciferol (CALCIUM + D3 PO) Take by mouth. Calcium 1000 mg    . carbidopa-levodopa (SINEMET IR) 25-100 MG tablet Take 2 tablets by mouth 3 (three) times daily. 180 tablet 12  . celecoxib (CELEBREX) 200 MG capsule Take 200 mg by mouth 2 (two) times daily.    . Cholecalciferol (VITAMIN D3) 2000 UNITS TABS Take 2,000 Units by mouth daily.     . Coenzyme Q10 100 MG TABS Take 100 mg  by mouth daily.    Marland Kitchen denosumab (PROLIA) 60 MG/ML SOLN injection Inject 60 mg into the skin every 6 (six) months. Administer in upper arm, thigh, or abdomen    . dexlansoprazole (DEXILANT) 60 MG capsule Take 60 mg by mouth daily.    . diphenhydramine-acetaminophen (TYLENOL PM) 25-500 MG TABS Take 1 tablet by mouth at bedtime.    . docusate sodium (COLACE) 100 MG capsule Take 100 mg by mouth at bedtime.    . DULoxetine (CYMBALTA) 20 MG capsule TAKE (1) CAPSULE DAILY. 90 capsule 4  . ezetimibe (ZETIA) 10 MG tablet Take 10 mg by mouth at bedtime.     . hydroxychloroquine (PLAQUENIL) 200 MG tablet Take 200 mg by mouth daily. Reported on 06/28/2016  0  . hypromellose (SYSTANE OVERNIGHT THERAPY) 0.3 % GEL ophthalmic ointment Place 1 application into both eyes at bedtime.    Marland Kitchen levothyroxine (SYNTHROID, LEVOTHROID) 50 MCG tablet Take 50 mcg by mouth daily.    Marland Kitchen lidocaine (XYLOCAINE) 5 % ointment Apply 1 application topically as needed. 02/16/16 script faxed to [facility], 240 gm, 12 refills    . lovastatin (MEVACOR) 40 MG tablet Take 40 mg by mouth at bedtime.    . metoprolol tartrate (LOPRESSOR) 25 MG tablet Take 1 tablet (25 mg total) by mouth daily. 90 tablet 1  . Multiple Vitamin (MULTIVITAMIN WITH MINERALS) TABS tablet Take 1 tablet by mouth daily.    . NONFORMULARY OR COMPOUNDED ITEM Apply 1-2 g topically  3 (three) times daily. 120 each 3  . Polyethyl Glycol-Propyl Glycol (SYSTANE) 0.4-0.3 % SOLN Place 1 drop into both eyes daily.     . pramipexole (MIRAPEX) 1 MG tablet Take 1 tablet (1 mg total) by mouth 3 (three) times daily. 90 tablet 12  . Skin Protectants, Misc. (EUCERIN) cream Apply 1 application topically as needed (Applies under breast and arm for skin irritation.).    Marland Kitchen traMADol (ULTRAM) 50 MG tablet Take 50 mg by mouth See admin instructions. Take 1 tablet (50 mg) by mouth every morning with 200 mg ER tablet, may take an additional tablet (50 mg) two more times daily as needed for pain    . traMADol (ULTRAM-ER) 200 MG 24 hr tablet Take 200 mg by mouth See admin instructions. Take 1 tablet (200 mg ER) by mouth every morning with a 50 mg tablet    . furosemide (LASIX) 20 MG tablet Take 20 mg by mouth as needed.     No facility-administered medications prior to visit.         Objective:   Physical Exam Vitals:   01/01/17 1515  BP: (!) 108/58  BP Location: Right Arm  Patient Position: Sitting  Cuff Size: Normal  Pulse: 78  SpO2: 98%  Weight: 164 lb 12.8 oz (74.8 kg)  Height: 4\' 10"  (1.473 m)   Gen: Pleasant, elderly woman, in no distress,  normal affect  ENT: No lesions,  mouth clear,  oropharynx clear, no postnasal drip  Neck: No JVD, no TMG, no carotid bruits  Lungs: No use of accessory muscles, clear without rales or rhonchi  Cardiovascular: RRR, heart sounds normal, no murmur or gallops, no peripheral edema  Musculoskeletal: No deformities, no cyanosis or clubbing  Neuro: alert, non focal  Skin: Warm, no lesions or rashes   TTE 05/06/14 --  - Left ventricle: The cavity size was normal. Wall thickness was normal. Systolic function was normal. The estimated ejection fraction was in the range of 60% to 65%. Wall motion  was normal; there were no regional wall motion abnormalities. Doppler parameters are consistent with abnormal left ventricular relaxation (grade 1  diastolic dysfunction). LV filling pressure is indeterminate. - Aortic valve: Mildly calcified leaflets. Transvalvular velocity was minimally increased. There was no stenosis. There was no regurgitation. - Mitral valve: Structurally normal valve. There was trace to mild regurgitation. - Left atrium: The atrium was normal in size. - Atrial septum: There was increased thickness of the septum, consistent with lipomatous hypertrophy. - Pulmonary arteries: PA peak pressure: 19 mm Hg (S). Impressions: - LVEF 123456, mild diastolic dysfuction, indeterminate LV filling pressure. Mildly calcified aortic valve with elevated velocity, but no clear stenosis. Trace to mild MR.     Assessment & Plan:  SOB (shortness of breath) She did not respond to albuterol. I suspect that her dyspnea is most related to restrictive disease from her weight, hiatal hernia and also to deconditioning. She might benefit from cardiopulmonary rehabilitation. I will stop her albuterol at this time.  Baltazar Apo, MD, PhD 01/01/2017, 3:44 PM [facility] [phone] or if no answer [phone]

## 2017-01-01 NOTE — Assessment & Plan Note (Signed)
She did not respond to albuterol. I suspect that her dyspnea is most related to restrictive disease from her weight, hiatal hernia and also to deconditioning. She might benefit from cardiopulmonary rehabilitation. I will stop her albuterol at this time.

## 2017-01-01 NOTE — Patient Instructions (Addendum)
We will no longer use albuterol inhaler.  Continue to work on increasing your stamina with slow steady exercise.  Follow with Dr Lamonte Sakai as needed for any changes in your breathing.

## 2017-01-13 ENCOUNTER — Emergency Department (HOSPITAL_COMMUNITY): Payer: Medicare Other

## 2017-01-13 ENCOUNTER — Emergency Department (HOSPITAL_COMMUNITY)
Admission: EM | Admit: 2017-01-13 | Discharge: 2017-01-13 | Disposition: A | Discharge status: Home / Self Care | Payer: Medicare Other | Attending: Emergency Medicine | Admitting: Emergency Medicine

## 2017-01-13 DIAGNOSIS — Y999 Unspecified external cause status: Secondary | ICD-10-CM | POA: Insufficient documentation

## 2017-01-13 DIAGNOSIS — Z7982 Long term (current) use of aspirin: Secondary | ICD-10-CM | POA: Insufficient documentation

## 2017-01-13 DIAGNOSIS — Z79899 Other long term (current) drug therapy: Secondary | ICD-10-CM | POA: Diagnosis not present

## 2017-01-13 DIAGNOSIS — Y92009 Unspecified place in unspecified non-institutional (private) residence as the place of occurrence of the external cause: Secondary | ICD-10-CM | POA: Insufficient documentation

## 2017-01-13 DIAGNOSIS — I5032 Chronic diastolic (congestive) heart failure: Secondary | ICD-10-CM | POA: Insufficient documentation

## 2017-01-13 DIAGNOSIS — I110 Hypertensive heart disease with heart failure: Secondary | ICD-10-CM | POA: Insufficient documentation

## 2017-01-13 DIAGNOSIS — W0110XA Fall on same level from slipping, tripping and stumbling with subsequent striking against unspecified object, initial encounter: Secondary | ICD-10-CM | POA: Insufficient documentation

## 2017-01-13 DIAGNOSIS — S0191XA Laceration without foreign body of unspecified part of head, initial encounter: Secondary | ICD-10-CM | POA: Diagnosis present

## 2017-01-13 DIAGNOSIS — S0181XA Laceration without foreign body of other part of head, initial encounter: Secondary | ICD-10-CM | POA: Diagnosis not present

## 2017-01-13 DIAGNOSIS — Z96641 Presence of right artificial hip joint: Secondary | ICD-10-CM | POA: Diagnosis not present

## 2017-01-13 DIAGNOSIS — S0990XA Unspecified injury of head, initial encounter: Secondary | ICD-10-CM | POA: Insufficient documentation

## 2017-01-13 DIAGNOSIS — Z91040 Latex allergy status: Secondary | ICD-10-CM | POA: Insufficient documentation

## 2017-01-13 DIAGNOSIS — E039 Hypothyroidism, unspecified: Secondary | ICD-10-CM | POA: Diagnosis not present

## 2017-01-13 DIAGNOSIS — S0180XA Unspecified open wound of other part of head, initial encounter: Secondary | ICD-10-CM | POA: Diagnosis not present

## 2017-01-13 DIAGNOSIS — W19XXXA Unspecified fall, initial encounter: Secondary | ICD-10-CM

## 2017-01-13 DIAGNOSIS — Y9301 Activity, walking, marching and hiking: Secondary | ICD-10-CM | POA: Diagnosis not present

## 2017-01-13 DIAGNOSIS — S199XXA Unspecified injury of neck, initial encounter: Secondary | ICD-10-CM | POA: Diagnosis not present

## 2017-01-13 LAB — COMPREHENSIVE METABOLIC PANEL
ALK PHOS: 55 U/L (ref 38–126)
ALT: 16 U/L (ref 14–54)
AST: 29 U/L (ref 15–41)
Albumin: 4.1 g/dL (ref 3.5–5.0)
Anion gap: 5 (ref 5–15)
BUN: 20 mg/dL (ref 6–20)
CALCIUM: 9.4 mg/dL (ref 8.9–10.3)
CHLORIDE: 111 mmol/L (ref 101–111)
CO2: 25 mmol/L (ref 22–32)
CREATININE: 1.1 mg/dL — AB (ref 0.44–1.00)
GFR calc Af Amer: 52 mL/min — ABNORMAL LOW (ref >60)
GFR calc non Af Amer: 45 mL/min — ABNORMAL LOW (ref >60)
Glucose, Bld: 109 mg/dL — ABNORMAL HIGH (ref 65–99)
Potassium: 4.5 mmol/L (ref 3.5–5.1)
SODIUM: 141 mmol/L (ref 135–145)
Total Bilirubin: <0.1 mg/dL — ABNORMAL LOW (ref 0.3–1.2)
Total Protein: 6.7 g/dL (ref 6.5–8.1)

## 2017-01-13 LAB — CBC WITH DIFFERENTIAL/PLATELET
BASOS PCT: 0 %
Basophils Absolute: 0 10*3/uL (ref 0.0–0.1)
EOS ABS: 0.4 10*3/uL (ref 0.0–0.7)
Eosinophils Relative: 4 %
HEMATOCRIT: 31.5 % — AB (ref 36.0–46.0)
Hemoglobin: 10 g/dL — ABNORMAL LOW (ref 12.0–15.0)
Lymphocytes Relative: 18 %
Lymphs Abs: 1.8 10*3/uL (ref 0.7–4.0)
MCH: 28.9 pg (ref 26.0–34.0)
MCHC: 31.7 g/dL (ref 30.0–36.0)
MCV: 91 fL (ref 78.0–100.0)
MONO ABS: 0.6 10*3/uL (ref 0.1–1.0)
MONOS PCT: 6 %
NEUTROS ABS: 7.1 10*3/uL (ref 1.7–7.7)
Neutrophils Relative %: 72 %
Platelets: 295 10*3/uL (ref 150–400)
RBC: 3.46 MIL/uL — ABNORMAL LOW (ref 3.87–5.11)
RDW: 12.9 % (ref 11.5–15.5)
WBC: 10 10*3/uL (ref 4.0–10.5)

## 2017-01-13 LAB — URINALYSIS, ROUTINE W REFLEX MICROSCOPIC
BILIRUBIN URINE: NEGATIVE
Glucose, UA: NEGATIVE mg/dL
HGB URINE DIPSTICK: NEGATIVE
KETONES UR: NEGATIVE mg/dL
Leukocytes, UA: NEGATIVE
NITRITE: NEGATIVE
PH: 6 (ref 5.0–8.0)
Protein, ur: NEGATIVE mg/dL
SPECIFIC GRAVITY, URINE: 1.012 (ref 1.005–1.030)

## 2017-01-13 MED ORDER — BACITRACIN ZINC 500 UNIT/GM EX OINT: 1.0000 "application " | TOPICAL_OINTMENT | Freq: Two times a day (BID) | CUTANEOUS | 1 refills | Status: DC

## 2017-01-13 MED ORDER — LIDOCAINE-EPINEPHRINE (PF) 2 %-1:200000 IJ SOLN
20.0000 mL | Freq: Once | INTRAMUSCULAR | Status: AC
  Administered 2017-01-13: 20 mL
  Filled 2017-01-13: qty 20

## 2017-01-13 MED ORDER — BACITRACIN ZINC 500 UNIT/GM EX OINT
TOPICAL_OINTMENT | CUTANEOUS | Status: AC
  Administered 2017-01-13: 20:00:00
  Filled 2017-01-13: qty 0.9

## 2017-01-13 MED ORDER — LIDOCAINE-EPINEPHRINE-TETRACAINE (LET) SOLUTION
3.0000 mL | Freq: Once | NASAL | Status: AC
  Administered 2017-01-13: 18:00:00 3 mL via TOPICAL
  Filled 2017-01-13: qty 3

## 2017-01-13 NOTE — ED Notes (Signed)
PA at bedside suturing laceration.  

## 2017-01-13 NOTE — ED Provider Notes (Signed)
[facility] DEPT Provider Note   CSN: [id number] Arrival date & time: 01/13/17  1519     History   Chief Complaint Chief Complaint  Patient presents with  . Fall  . Head Laceration    HPI [name] is a 83 y.o. female.  [name] is a 83 y.o. Female with history of Parkinson's disease who presents to the emergency department after a fall prior to arrival today. Patient reports she is walking her bedroom when she twisted and fell to the ground striking her head. She denies loss of consciousness. She denies prodromal symptoms prior to falling. She denies feeling lightheaded or dizzy. She denies any chest pain or shortness of breath. She is a mild pain overlying a laceration to her left forehead. She denies neck or back pain. She is not on anticoagulants. Patient is not sure she tripped. She tells me she has some difficulty with her right foot due to her Parkinson's disease, and this likely contributed to her fall. She reports her tetanus shot is up-to-date in the last 5 years. Patient denies fevers, recent illness, chest pain, shortness of breath, abdominal pain, nausea, vomiting, numbness, tingling, weakness, changes to her vision, double vision, neck pain, back pain or headache.   The history is provided by the patient and a relative. No language interpreter was used.  Fall  Pertinent negatives include no chest pain, no abdominal pain, no headaches and no shortness of breath.  Head Laceration  Pertinent negatives include no chest pain, no abdominal pain, no headaches and no shortness of breath.    Past Medical History:  Diagnosis Date  . Anemia   . Anxiety   . Arthritis   . Broken arm    right   . Chronic diastolic CHF (congestive heart failure) ([place])   . Family history of adverse reaction to anesthesia    pt. states sister vomits  . Fibromyalgia   . Fracture of thumb 09/01/2015   right  . Fracture, foot 09/01/2015   right  . GERD (gastroesophageal reflux  disease)   . H/O hiatal hernia   . Heart murmur    AV sclerosis by echo  . History of bronchitis   . History of kidney stones   . Hypercholesterolemia   . Hypertension    dr t turner  . Hypothyroidism   . Neuropathy ([place])   . Osteoporosis   . Parkinson disease ([place])   . PVD (peripheral vascular disease) ([facility])    99% stenosis of left anteiror tibial artery, mod stenosis of left distal SFA and popliteal artery followed by [name]  . Restless leg   . Shortness of breath   . Sjogren's disease ([place])   . SOB (shortness of breath)    chronic due to diastolic dysfunction, deconditioning, obesity  . Tremor     Patient Active Problem List   Diagnosis Date Noted  . Other dysphagia 09/03/2014  . Unspecified hereditary and idiopathic peripheral neuropathy 09/03/2014  . Heart murmur 04/19/2014  . GERD (gastroesophageal reflux disease)   . Hypercholesterolemia   . Diastolic dysfunction   . SOB (shortness of breath)   . PVD (peripheral vascular disease) ([place])   . Barrett esophagus   . Chronic diastolic CHF (congestive heart failure) ([place])   . Parkinson's disease ([place]) 08/26/2013  . Swelling of both ankles 11/20/2012  . Fracture of multiple pubic rami ([place]) 07/17/2012  . Hematoma of hip 07/17/2012  . Leukocytosis 07/17/2012  . Hypertension   . Atherosclerosis  of native arteries of the extremities with intermittent claudication 05/22/2012    Past Surgical History:  Procedure Laterality Date  . ABDOMINAL HYSTERECTOMY    . BACK SURGERY     4 back surgeries,   lumbar fusion  . CARDIAC CATHETERIZATION  2006   normal  . EYE SURGERY Bilateral    cateracts  . falls     variious fall, broken wrist,and toes  . FRACTURE SURGERY Right April 2016   Wrist, Pt. fell  . SPINAL CORD STIMULATOR INSERTION N/A 09/09/2015   Procedure: LUMBAR SPINAL CORD STIMULATOR INSERTION;  Surgeon: Clydell Hakim, MD;  Location: [facility];  Service: Neurosurgery;  Laterality: N/A;  LUMBAR SPINAL CORD  STIMULATOR INSERTION  . SPINE SURGERY  April 2013   Back X's 4  . TOTAL HIP ARTHROPLASTY     right  . WRIST FRACTURE SURGERY Bilateral     OB History    No data available       Home Medications    Prior to Admission medications   Medication Sig Start Date End Date Taking? Authorizing Provider  albuterol (PROAIR HFA) 108 (90 Base) MCG/ACT inhaler Inhale 2 puffs into the lungs every 4 (four) hours as needed for wheezing or shortness of breath. 10/09/16  Yes Collene Gobble, MD  aspirin EC 81 MG tablet Take 81 mg by mouth at bedtime.    Yes Historical Provider, MD  Calcium Carb-Cholecalciferol (CALCIUM 1000 + D) 1000-800 MG-UNIT TABS Take 1 tablet by mouth daily.   Yes Historical Provider, MD  carbidopa-levodopa (SINEMET IR) 25-100 MG tablet Take 2 tablets by mouth 3 (three) times daily. 08/17/16  Yes [name], MD  celecoxib (CELEBREX) 200 MG capsule Take 200 mg by mouth 2 (two) times daily.   Yes Historical Provider, MD  cholecalciferol (VITAMIN D) 1000 units tablet Take 2,000 Units by mouth daily.   Yes Historical Provider, MD  denosumab (PROLIA) 60 MG/ML SOLN injection Inject 60 mg into the skin every 6 (six) months. Administer in upper arm, thigh, or abdomen   Yes Historical Provider, MD  dexlansoprazole (DEXILANT) 60 MG capsule Take 60 mg by mouth daily.   Yes Historical Provider, MD  diphenhydramine-acetaminophen (TYLENOL PM) 25-500 MG TABS Take 1 tablet by mouth at bedtime.   Yes Historical Provider, MD  docusate sodium (COLACE) 100 MG capsule Take 100 mg by mouth at bedtime.   Yes Historical Provider, MD  DULoxetine (CYMBALTA) 20 MG capsule Take 20 mg by mouth daily.   Yes Historical Provider, MD  estradiol (ESTRACE) 0.1 MG/GM vaginal cream Place 1 Applicatorful vaginally at bedtime as needed (for dryness).   Yes Historical Provider, MD  ezetimibe (ZETIA) 10 MG tablet Take 10 mg by mouth at bedtime.    Yes Historical Provider, MD  hydroxychloroquine (PLAQUENIL) 200 MG  tablet Take 200 mg by mouth daily.    Yes Historical Provider, MD  levothyroxine (SYNTHROID, LEVOTHROID) 50 MCG tablet Take 50 mcg by mouth daily before breakfast.    Yes Historical Provider, MD  lovastatin (MEVACOR) 40 MG tablet Take 40 mg by mouth at bedtime.   Yes Historical Provider, MD  metoprolol tartrate (LOPRESSOR) 25 MG tablet Take 1 tablet (25 mg total) by mouth daily. 10/19/16  Yes [name], MD  Multiple Vitamin (MULTIVITAMIN WITH MINERALS) TABS tablet Take 1 tablet by mouth daily.   Yes Historical Provider, MD  Polyethyl Glycol-Propyl Glycol (SYSTANE) 0.4-0.3 % GEL ophthalmic gel Place 1 application into both eyes at bedtime.  Yes Historical Provider, MD  Polyethyl Glycol-Propyl Glycol (SYSTANE) 0.4-0.3 % SOLN Place 1 drop into both eyes daily.    Yes Historical Provider, MD  pramipexole (MIRAPEX) 1 MG tablet Take 1 tablet (1 mg total) by mouth 3 (three) times daily. 08/17/16  Yes [name], MD  Skin Protectants, Misc. (EUCERIN) cream Apply 1 application topically as needed (for irritation).    Yes Historical Provider, MD  traMADol (ULTRAM) 50 MG tablet Take 50 mg by mouth 3 (three) times daily as needed for moderate pain.    Yes Historical Provider, MD  traMADol (ULTRAM-ER) 200 MG 24 hr tablet Take 200 mg by mouth daily as needed for pain.    Yes Historical Provider, MD  trimethoprim (TRIMPEX) 100 MG tablet Take 100 mg by mouth daily.   Yes Historical Provider, MD  bacitracin ointment Apply 1 application topically 2 (two) times daily. 01/13/17   Waynetta Pean, PA-C    Family History Family History  Problem Relation Age of Onset  . Heart attack Mother   . Restless legs syndrome Mother   . Heart failure Mother   . Heart disease Mother   . Hypertension Mother   . COPD Father     Social History Social History  Substance Use Topics  . Smoking status: Never Smoker  . Smokeless tobacco: Never Used  . Alcohol use No     Allergies   Codeine; Lactose intolerance  (gi); Lyrica [pregabalin]; Other; Plaquenil [hydroxychloroquine sulfate]; Reglan [metoclopramide]; Requip [ropinirole hcl]; Septra [sulfamethoxazole-trimethoprim]; Shellfish allergy; and Latex   Review of Systems Review of Systems  Constitutional: Negative for chills and fever.  HENT: Negative for congestion and sore throat.   Eyes: Negative for visual disturbance.  Respiratory: Negative for cough and shortness of breath.   Cardiovascular: Negative for chest pain and palpitations.  Gastrointestinal: Negative for abdominal pain, diarrhea, nausea and vomiting.  Genitourinary: Negative for difficulty urinating, dysuria, frequency and urgency.  Musculoskeletal: Negative for arthralgias, back pain and neck pain.  Skin: Positive for wound. Negative for rash.  Neurological: Negative for dizziness, syncope, weakness, light-headedness, numbness and headaches.     Physical Exam Updated Vital Signs BP 122/63 (BP Location: Left Arm)   Pulse 75   Temp 97.8 F (36.6 C) (Oral)   Resp 14   SpO2 94%   Physical Exam  Constitutional: She is oriented to person, place, and time. She appears well-developed and well-nourished. No distress.  Nontoxic appearing.  HENT:  Head: Normocephalic and atraumatic.  Right Ear: External ear normal.  Left Ear: External ear normal.  Mouth/Throat: Oropharynx is clear and moist.  3 cm laceration noted to left forehead and small abrasion to the bridge of her nose. Bleeding is controlled.  Bilateral tympanic membranes are pearly-gray without erythema or loss of landmarks.   Eyes: Conjunctivae and EOM are normal. Pupils are equal, round, and reactive to light. Right eye exhibits no discharge. Left eye exhibits no discharge.  Neck: Normal range of motion. Neck supple. No JVD present. No tracheal deviation present.  No midline neck TTP.   Cardiovascular: Normal rate, regular rhythm, normal heart sounds and intact distal pulses.  Exam reveals no gallop and no friction  rub.   No murmur heard. Pulmonary/Chest: Effort normal and breath sounds normal. No stridor. No respiratory distress. She has no wheezes. She has no rales. She exhibits no tenderness.  Lungs clear to auscultation bilaterally. Symmetric chest expansion bilaterally. No chest wall tenderness to palpation.  Abdominal: Soft. There is no tenderness.  There is no guarding.  Musculoskeletal: Normal range of motion. She exhibits no edema or tenderness.  Patient wearing splint from previous injury to her left hand. Patient's bilateral clavicles are nontender palpation. Patient's bilateral shoulder, elbow, hip, knee and ankle joints are supple and nontender to palpation.  Lymphadenopathy:    She has no cervical adenopathy.  Neurological: She is alert and oriented to person, place, and time. No cranial nerve deficit or sensory deficit. She exhibits normal muscle tone. Coordination normal.  Patient is alert and oriented 3. Speech is clear and coherent. EOMs are intact. Vision is grossly intact. No pronator drift. Finger to nose intact bilaterally. Sensation is intact her bilateral upper and lower extremities.  Skin: Skin is warm and dry. Capillary refill takes less than 2 seconds. No rash noted. She is not diaphoretic. No erythema. No pallor.  Lac to forehead as in HENT   Psychiatric: She has a normal mood and affect. Her behavior is normal.  Nursing note and vitals reviewed.    ED Treatments / Results  Labs (all labs ordered are listed, but only abnormal results are displayed) Labs Reviewed  COMPREHENSIVE METABOLIC PANEL - Abnormal; Notable for the following:       Result Value   Glucose, Bld 109 (*)    Creatinine, Ser 1.10 (*)    Total Bilirubin <0.1 (*)    GFR calc non Af Amer 45 (*)    GFR calc Af Amer 52 (*)    All other components within normal limits  CBC WITH DIFFERENTIAL/PLATELET - Abnormal; Notable for the following:    RBC 3.46 (*)    Hemoglobin 10.0 (*)    HCT 31.5 (*)    All other  components within normal limits  URINALYSIS, ROUTINE W REFLEX MICROSCOPIC - Abnormal; Notable for the following:    Color, Urine STRAW (*)    All other components within normal limits    EKG  EKG Interpretation  Date/Time:  Sunday January 13 2017 17:46:32 EST Ventricular Rate:  72 PR Interval:    QRS Duration: 107 QT Interval:  412 QTC Calculation: 451 R Axis:   71 Text Interpretation:  Sinus rhythm Low voltage, precordial leads Abnormal R-wave progression, early transition Confirmed by YELVERTON  MD, DAVID ([id number]) on 01/13/2017 10:23:52 PM       Radiology Ct Head Wo Contrast  Result Date: 01/13/2017 CLINICAL DATA:  Recent fall with head and neck injury, initial encounter EXAM: CT HEAD WITHOUT CONTRAST CT CERVICAL SPINE WITHOUT CONTRAST TECHNIQUE: Multidetector CT imaging of the head and cervical spine was performed following the standard protocol without intravenous contrast. Multiplanar CT image reconstructions of the cervical spine were also generated. COMPARISON:  None. FINDINGS: CT HEAD FINDINGS Brain: Mild atrophic changes and chronic white matter ischemic changes are noted. No findings to suggest acute hemorrhage, acute infarction or space-occupying mass lesion are noted. Vascular: No hyperdense vessel or unexpected calcification. Skull: Normal. Negative for fracture or focal lesion. Sinuses/Orbits: No acute finding. Other: None. CT CERVICAL SPINE FINDINGS Alignment: Well-maintained. Skull base and vertebrae: 7 cervical segments are well visualized. Vertebral body height is well maintained. Chronic cystic changes are noted in the C2 vertebra at the junction of the odontoid and vertebral body. Multilevel osteophytic changes are seen. No acute fracture or acute facet abnormality is noted. Soft tissues and spinal canal: Within normal limits. Disc levels: Multilevel disc space narrowing is noted from C3-C7. Osteophytic changes are noted. Upper chest: Within normal limits. IMPRESSION: CT of  the head: Chronic  atrophic and ischemic changes without acute abnormality. CT of the cervical spine: Multilevel degenerative change without acute abnormality. Electronically Signed   By: Inez Catalina M.D.   On: 01/13/2017 17:52   Ct Cervical Spine Wo Contrast  Result Date: 01/13/2017 CLINICAL DATA:  Recent fall with head and neck injury, initial encounter EXAM: CT HEAD WITHOUT CONTRAST CT CERVICAL SPINE WITHOUT CONTRAST TECHNIQUE: Multidetector CT imaging of the head and cervical spine was performed following the standard protocol without intravenous contrast. Multiplanar CT image reconstructions of the cervical spine were also generated. COMPARISON:  None. FINDINGS: CT HEAD FINDINGS Brain: Mild atrophic changes and chronic white matter ischemic changes are noted. No findings to suggest acute hemorrhage, acute infarction or space-occupying mass lesion are noted. Vascular: No hyperdense vessel or unexpected calcification. Skull: Normal. Negative for fracture or focal lesion. Sinuses/Orbits: No acute finding. Other: None. CT CERVICAL SPINE FINDINGS Alignment: Well-maintained. Skull base and vertebrae: 7 cervical segments are well visualized. Vertebral body height is well maintained. Chronic cystic changes are noted in the C2 vertebra at the junction of the odontoid and vertebral body. Multilevel osteophytic changes are seen. No acute fracture or acute facet abnormality is noted. Soft tissues and spinal canal: Within normal limits. Disc levels: Multilevel disc space narrowing is noted from C3-C7. Osteophytic changes are noted. Upper chest: Within normal limits. IMPRESSION: CT of the head: Chronic atrophic and ischemic changes without acute abnormality. CT of the cervical spine: Multilevel degenerative change without acute abnormality. Electronically Signed   By: Inez Catalina M.D.   On: 01/13/2017 17:52    Procedures .Marland KitchenLaceration Repair Date/Time: 01/13/2017 7:27 PM Performed by: Waynetta Pean Authorized by:  Waynetta Pean   Consent:    Consent obtained:  Verbal   Consent given by:  Patient   Risks discussed:  Infection, pain, retained foreign body, need for additional repair and poor wound healing Anesthesia (see MAR for exact dosages):    Anesthesia method:  Topical application and local infiltration   Topical anesthetic:  LET   Local anesthetic:  Lidocaine 2% WITH epi Laceration details:    Location:  Face   Face location:  Forehead   Length (cm):  3   Depth (mm):  4 Repair type:    Repair type:  Simple Pre-procedure details:    Preparation:  Patient was prepped and draped in usual sterile fashion Exploration:    Hemostasis achieved with:  LET and direct pressure   Wound exploration: wound explored through full range of motion and entire depth of wound probed and visualized     Wound extent: no foreign bodies/material noted     Contaminated: no   Treatment:    Area cleansed with:  Saline   Amount of cleaning:  Standard   Irrigation solution:  Sterile saline   Irrigation method:  Pressure wash   Visualized foreign bodies/material removed: no   Skin repair:    Repair method:  Sutures   Suture size:  5-0   Suture material:  Prolene   Suture technique:  Simple interrupted   Number of sutures:  3 Approximation:    Approximation:  Close Post-procedure details:    Dressing:  Antibiotic ointment and non-adherent dressing   Patient tolerance of procedure:  Tolerated well, no immediate complications   (including critical care time)  Medications Ordered in ED Medications  lidocaine-EPINEPHrine-tetracaine (LET) solution (3 mLs Topical Given 01/13/17 1733)  lidocaine-EPINEPHrine (XYLOCAINE W/EPI) 2 %-1:200000 (PF) injection 20 mL (20 mLs Infiltration Given 01/13/17 1950)  bacitracin 500  UNIT/GM ointment (  Given 01/13/17 1951)     Initial Impression / Assessment and Plan / ED Course  I have reviewed the triage vital signs and the nursing notes.  Pertinent labs & imaging results  that were available during my care of the patient were reviewed by me and considered in my medical decision making (see chart for details).    This is a 83 y.o. Female with history of Parkinson's disease who presents to the emergency department after a fall prior to arrival today. Patient reports she is walking her bedroom when she twisted and fell to the ground striking her head. She denies loss of consciousness. She denies prodromal symptoms prior to falling. She denies feeling lightheaded or dizzy. She denies any chest pain or shortness of breath. She is a mild pain overlying a laceration to her left forehead. She denies neck or back pain. She is not on anticoagulants. Patient is not sure she tripped. She tells me she has some difficulty with her right foot due to her Parkinson's disease, and this likely contributed to her fall. She reports her tetanus shot is up-to-date in the last 5 years.  On exam the patient is afebrile nontoxic appearing. She has no focal neurological deficits. Speech is clear and coherent. She does have a 3 cm laceration over her left forehead and a small abrasion to the bridge of her nose. No other signs of injury. She denies any chest pain or shortness of breath. No prodromal symptoms prior to falling. CMP is around the patient's baseline. CBC is normal for him on a 10.0. This is around the patient's baseline. Urinalysis without signs of infection. CT head and neck show no acute findings. EKG without acute abnormalities. No STEMI.  Laceration repaired by myself and tolerated well by the patient. Wound care instructions provided. Sutures out in 5-7 days. Patient has follow-up this week with her primary care provider. I discussed head injury precautions. I advised the patient to follow-up with their primary care provider this week. I advised the patient to return to the emergency department with new or worsening symptoms or new concerns. The patient verbalized understanding and  agreement with plan.   This patient was discussed with and evaluated by Dr. Lita Mains who agrees with assessment and plan.    Final Clinical Impressions(s) / ED Diagnoses   Final diagnoses:  Fall, initial encounter  Laceration of forehead, initial encounter  Minor head injury, initial encounter    New Prescriptions Discharge Medication List as of 01/13/2017  7:48 PM    START taking these medications   Details  bacitracin ointment Apply 1 application topically 2 (two) times daily., Starting Sun 01/13/2017, Print         Waynetta Pean, PA-C 01/13/17 [place], MD 01/16/17 [phone]

## 2017-01-13 NOTE — ED Notes (Signed)
Bed: [id number] Expected date:  Expected time:  Means of arrival:  Comments: EMS fall, head lac

## 2017-01-13 NOTE — ED Triage Notes (Signed)
She states she tripped and fell at home, thereby striking her forehead, at which she has a small lac. With some associated edema and ecchymosis (small). She is awake, alert and in no distress.

## 2017-01-17 DIAGNOSIS — S62646D Nondisplaced fracture of proximal phalanx of right little finger, subsequent encounter for fracture with routine healing: Secondary | ICD-10-CM | POA: Diagnosis not present

## 2017-01-21 DIAGNOSIS — H35371 Puckering of macula, right eye: Secondary | ICD-10-CM | POA: Diagnosis not present

## 2017-01-21 DIAGNOSIS — H04123 Dry eye syndrome of bilateral lacrimal glands: Secondary | ICD-10-CM | POA: Diagnosis not present

## 2017-01-21 DIAGNOSIS — H5201 Hypermetropia, right eye: Secondary | ICD-10-CM | POA: Diagnosis not present

## 2017-01-24 ENCOUNTER — Ambulatory Visit (INDEPENDENT_AMBULATORY_CARE_PROVIDER_SITE_OTHER): Payer: Medicare Other | Admitting: Cardiology

## 2017-01-24 ENCOUNTER — Encounter: Payer: Self-pay | Admitting: Cardiology

## 2017-01-24 VITALS — BP 132/64 | HR 74 | Ht <= 58 in | Wt 166.8 lb

## 2017-01-24 DIAGNOSIS — I350 Nonrheumatic aortic (valve) stenosis: Secondary | ICD-10-CM | POA: Insufficient documentation

## 2017-01-24 DIAGNOSIS — I10 Essential (primary) hypertension: Secondary | ICD-10-CM

## 2017-01-24 DIAGNOSIS — E7800 Pure hypercholesterolemia, unspecified: Secondary | ICD-10-CM

## 2017-01-24 DIAGNOSIS — I5032 Chronic diastolic (congestive) heart failure: Secondary | ICD-10-CM

## 2017-01-24 NOTE — Patient Instructions (Signed)
Medication Instructions:  Your physician recommends that you continue on your current medications as directed. Please refer to the Current Medication list given to you today.   Labwork: None  Testing/Procedures: None  Follow-Up: Your physician wants you to follow-up in: 6 months with Dr. Turner's assistant. You will receive a reminder letter in the mail two months in advance. If you don't receive a letter, please call our office to schedule the follow-up appointment.   Your physician wants you to follow-up in: 1 year with Dr. Turner. You will receive a reminder letter in the mail two months in advance. If you don't receive a letter, please call our office to schedule the follow-up appointment.   Any Other Special Instructions Will Be Listed Below (If Applicable).     If you need a refill on your cardiac medications before your next appointment, please call your pharmacy.   

## 2017-01-24 NOTE — Progress Notes (Signed)
Cardiology Office Note    Date:  01/24/2017   ID:  [name], DOB October 31, 1931, MRN [id number]  PCP:  [name], MD  Cardiologist:  Fransico Him, MD   Chief Complaint  Patient presents with  . Shortness of Breath  . Hypertension  . Congestive Heart Failure    History of Present Illness:  [name] is a 83 y.o. female with a history of chronic SOB secondary to diastolic dysfunction, chronic diastolic CHF, deconditioning and obesity. She has had chronic SOB for years and a heart cath in 2006 showed normal coronary arteries and normal LVF with normal LVEDP.She also has a history of HTN, chronic LE edema and PVD followed by [name]. She is doing well from a cardiac standpoint. She denies any chest pain or pressure, palpitations, dizziness or syncope. She has chronic LE edema. She has chronic SOB which is stable and has not changed any since I saw her last.     Past Medical History:  Diagnosis Date  . Anemia   . Anxiety   . Arthritis   . Broken arm    right   . Chronic diastolic CHF (congestive heart failure) ([place])   . Family history of adverse reaction to anesthesia    pt. states sister vomits  . Fibromyalgia   . Fracture of thumb 09/01/2015   right  . Fracture, foot 09/01/2015   right  . GERD (gastroesophageal reflux disease)   . H/O hiatal hernia   . Heart murmur    AV sclerosis by echo  . History of bronchitis   . History of kidney stones   . Hypercholesterolemia   . Hypertension    dr t Jayceon Troy  . Hypothyroidism   . Neuropathy ([place])   . Osteoporosis   . Parkinson disease ([place])   . PVD (peripheral vascular disease) (H[facility])    99% stenosis of left anteiror tibial artery, mod stenosis of left distal SFA and popliteal artery followed by [name]  . Restless leg   . Shortness of breath   . Sjogren's disease ([place])   . SOB (shortness of breath)    chronic due to diastolic dysfunction, deconditioning, obesity  . Tremor     Past  Surgical History:  Procedure Laterality Date  . ABDOMINAL HYSTERECTOMY    . BACK SURGERY     4 back surgeries,   lumbar fusion  . CARDIAC CATHETERIZATION  2006   normal  . EYE SURGERY Bilateral    cateracts  . falls     variious fall, broken wrist,and toes  . FRACTURE SURGERY Right April 2016   Wrist, Pt. fell  . SPINAL CORD STIMULATOR INSERTION N/A 09/09/2015   Procedure: LUMBAR SPINAL CORD STIMULATOR INSERTION;  Surgeon: Clydell Hakim, MD;  Location: [facility];  Service: Neurosurgery;  Laterality: N/A;  LUMBAR SPINAL CORD STIMULATOR INSERTION  . SPINE SURGERY  April 2013   Back X's 4  . TOTAL HIP ARTHROPLASTY     right  . WRIST FRACTURE SURGERY Bilateral     Current Medications: Current Meds  Medication Sig  . albuterol (PROAIR HFA) 108 (90 Base) MCG/ACT inhaler Inhale 2 puffs into the lungs every 4 (four) hours as needed for wheezing or shortness of breath.  Marland Kitchen aspirin EC 81 MG tablet Take 81 mg by mouth at bedtime.   . bacitracin ointment Apply 1 application topically 2 (two) times daily.  . Calcium Carb-Cholecalciferol (CALCIUM 1000 + D) 1000-800 MG-UNIT TABS Take 1 tablet by  mouth daily.  . carbidopa-levodopa (SINEMET IR) 25-100 MG tablet Take 2 tablets by mouth 3 (three) times daily.  . celecoxib (CELEBREX) 200 MG capsule Take 200 mg by mouth 2 (two) times daily.  . cholecalciferol (VITAMIN D) 1000 units tablet Take 2,000 Units by mouth daily.  Marland Kitchen denosumab (PROLIA) 60 MG/ML SOLN injection Inject 60 mg into the skin every 6 (six) months. Administer in upper arm, thigh, or abdomen  . dexlansoprazole (DEXILANT) 60 MG capsule Take 60 mg by mouth daily.  . diphenhydramine-acetaminophen (TYLENOL PM) 25-500 MG TABS Take 1 tablet by mouth at bedtime.  . docusate sodium (COLACE) 100 MG capsule Take 100 mg by mouth at bedtime.  . DULoxetine (CYMBALTA) 20 MG capsule Take 20 mg by mouth daily.  Marland Kitchen estradiol (ESTRACE) 0.1 MG/GM vaginal cream Place 1 Applicatorful vaginally at bedtime as  needed (for dryness).  . ezetimibe (ZETIA) 10 MG tablet Take 10 mg by mouth at bedtime.   . hydroxychloroquine (PLAQUENIL) 200 MG tablet Take 200 mg by mouth daily.   Marland Kitchen levothyroxine (SYNTHROID, LEVOTHROID) 50 MCG tablet Take 50 mcg by mouth daily before breakfast.   . lidocaine (XYLOCAINE) 5 % ointment Apply 2-3 g topically 4 (four) times daily. Apply to affected area/feet  . lovastatin (MEVACOR) 40 MG tablet Take 40 mg by mouth at bedtime.  . metoprolol tartrate (LOPRESSOR) 25 MG tablet Take 1 tablet (25 mg total) by mouth daily.  . Multiple Vitamin (MULTIVITAMIN WITH MINERALS) TABS tablet Take 1 tablet by mouth daily.  Vladimir Faster Glycol-Propyl Glycol (SYSTANE) 0.4-0.3 % GEL ophthalmic gel Place 1 application into both eyes at bedtime.  Vladimir Faster Glycol-Propyl Glycol (SYSTANE) 0.4-0.3 % SOLN Place 1 drop into both eyes daily.   . pramipexole (MIRAPEX) 1 MG tablet Take 1 tablet (1 mg total) by mouth 3 (three) times daily.  . Skin Protectants, Misc. (EUCERIN) cream Apply 1 application topically as needed (for irritation).   . traMADol (ULTRAM) 50 MG tablet Take 50 mg by mouth 3 (three) times daily as needed for moderate pain.   . traMADol (ULTRAM-ER) 200 MG 24 hr tablet Take 200 mg by mouth daily as needed for pain.   Marland Kitchen trimethoprim (TRIMPEX) 100 MG tablet Take 100 mg by mouth daily.    Allergies:   Codeine; Lactose intolerance (gi); Lyrica [pregabalin]; Other; Plaquenil [hydroxychloroquine sulfate]; Reglan [metoclopramide]; Requip [ropinirole hcl]; Septra [sulfamethoxazole-trimethoprim]; Shellfish allergy; and Latex   Social History   Social History  . Marital status: Married    Spouse name: [name]  . Number of children: 2  . Years of education: HS   Occupational History  .      Homemaker   Social History Main Topics  . Smoking status: Never Smoker  . Smokeless tobacco: Never Used  . Alcohol use No  . Drug use: No  . Sexual activity: No   Other Topics Concern  . Not  on file   Social History Narrative   Patient lives at home with her spouse.   Caffeine Use: tea     Family History:  The patient's family history includes COPD in her father; Heart attack in her mother; Heart disease in her mother; Heart failure in her mother; Hypertension in her mother; Restless legs syndrome in her mother.   ROS:   Please see the history of present illness.    ROS All other systems reviewed and are negative.  No flowsheet data found.     PHYSICAL EXAM:   VS:  BP  132/64   Pulse 74   Ht 4\' 10"  (1.473 m)   Wt 166 lb 12.8 oz (75.7 kg)   BMI 34.86 kg/m    GEN: Well nourished, well developed, in no acute distress  HEENT: normal  Neck: no JVD, carotid bruits, or masses Cardiac: RRR; no rubs, or gallops,no edema.  Intact distal pulses bilaterally. 2/6 sm at RUSB Respiratory:  clear to auscultation bilaterally, normal work of breathing GI: soft, nontender, nondistended, + BS MS: no deformity or atrophy  Skin: warm and dry, no rash Neuro:  Alert and Oriented x 3, Strength and sensation are intact Psych: euthymic mood, full affect  Wt Readings from Last 3 Encounters:  01/24/17 166 lb 12.8 oz (75.7 kg)  01/01/17 164 lb 12.8 oz (74.8 kg)  10/09/16 163 lb (73.9 kg)      Studies/Labs Reviewed:   EKG:  EKG is not ordered today.  Recent Labs: 01/13/2017: ALT 16; BUN 20; Creatinine, Ser 1.10; Hemoglobin 10.0; Platelets 295; Potassium 4.5; Sodium 141   Lipid Panel No results found for: CHOL, TRIG, HDL, CHOLHDL, VLDL, LDLCALC, LDLDIRECT  Additional studies/ records that were reviewed today include:  None    ASSESSMENT:    1. Chronic diastolic CHF (congestive heart failure) ([place])   2. Essential hypertension   3. Hypercholesterolemia   4. Aortic valve stenosis, etiology of cardiac valve disease unspecified      PLAN:  In order of problems listed above:  1. Chronic diastolic CHF - she appears euvolemic on exam and weight is stable.  She is not on  diuretics. 2. HTN - BP controlled on current meds.  Continue BB.   3. Hyperlipidemia with LDL goal < 70 due to PVD.  She will continue on statin/zetia.  4. Mild aortic stenosis by echo 05/2016.  Repeat echo 05/2018    Medication Adjustments/Labs and Tests Ordered: Current medicines are reviewed at length with the patient today.  Concerns regarding medicines are outlined above.  Medication changes, Labs and Tests ordered today are listed in the Patient Instructions below.  There are no Patient Instructions on file for this visit.   Signed, Fransico Him, MD  01/24/2017 1:25 PM    [facility] [address], [place], [place]  [address] Phone: [phone]; Fax: [phone]

## 2017-01-28 DIAGNOSIS — R29898 Other symptoms and signs involving the musculoskeletal system: Secondary | ICD-10-CM | POA: Diagnosis not present

## 2017-01-28 DIAGNOSIS — I5032 Chronic diastolic (congestive) heart failure: Secondary | ICD-10-CM | POA: Diagnosis not present

## 2017-01-28 DIAGNOSIS — G20 Parkinson's disease: Secondary | ICD-10-CM | POA: Diagnosis not present

## 2017-01-28 DIAGNOSIS — I739 Peripheral vascular disease, unspecified: Secondary | ICD-10-CM | POA: Diagnosis not present

## 2017-01-28 DIAGNOSIS — M797 Fibromyalgia: Secondary | ICD-10-CM | POA: Diagnosis not present

## 2017-01-29 DIAGNOSIS — N390 Urinary tract infection, site not specified: Secondary | ICD-10-CM | POA: Diagnosis not present

## 2017-01-30 ENCOUNTER — Telehealth: Payer: Self-pay | Admitting: *Deleted

## 2017-01-30 NOTE — Telephone Encounter (Signed)
Called patient to discuss fax received today re: Lidocaine ointment Rx exceeds plan limits and needs PA. Patient gave phone to husband who stated he just picked up a new 300 Gm tube yesterday. He stated that last tube they bought /has lasted since Oct . Patient has FU on 02/13/17, and they will bring in Lidocaine. Will determine if there is a need for PA on ointment at that time. He verbalized understanding, appreciation.

## 2017-02-04 DIAGNOSIS — H903 Sensorineural hearing loss, bilateral: Secondary | ICD-10-CM | POA: Diagnosis not present

## 2017-02-07 DIAGNOSIS — M79601 Pain in right arm: Secondary | ICD-10-CM | POA: Diagnosis not present

## 2017-02-07 DIAGNOSIS — M25531 Pain in right wrist: Secondary | ICD-10-CM | POA: Diagnosis not present

## 2017-02-07 DIAGNOSIS — M6281 Muscle weakness (generalized): Secondary | ICD-10-CM | POA: Diagnosis not present

## 2017-02-07 DIAGNOSIS — M25512 Pain in left shoulder: Secondary | ICD-10-CM | POA: Diagnosis not present

## 2017-02-07 DIAGNOSIS — M25511 Pain in right shoulder: Secondary | ICD-10-CM | POA: Diagnosis not present

## 2017-02-07 DIAGNOSIS — R29898 Other symptoms and signs involving the musculoskeletal system: Secondary | ICD-10-CM | POA: Diagnosis not present

## 2017-02-07 DIAGNOSIS — R296 Repeated falls: Secondary | ICD-10-CM | POA: Diagnosis not present

## 2017-02-07 DIAGNOSIS — M545 Low back pain: Secondary | ICD-10-CM | POA: Diagnosis not present

## 2017-02-07 DIAGNOSIS — R278 Other lack of coordination: Secondary | ICD-10-CM | POA: Diagnosis not present

## 2017-02-07 DIAGNOSIS — R2681 Unsteadiness on feet: Secondary | ICD-10-CM | POA: Diagnosis not present

## 2017-02-07 DIAGNOSIS — G20 Parkinson's disease: Secondary | ICD-10-CM | POA: Diagnosis not present

## 2017-02-11 DIAGNOSIS — L905 Scar conditions and fibrosis of skin: Secondary | ICD-10-CM | POA: Diagnosis not present

## 2017-02-12 DIAGNOSIS — R296 Repeated falls: Secondary | ICD-10-CM | POA: Diagnosis not present

## 2017-02-12 DIAGNOSIS — M25511 Pain in right shoulder: Secondary | ICD-10-CM | POA: Diagnosis not present

## 2017-02-12 DIAGNOSIS — G20 Parkinson's disease: Secondary | ICD-10-CM | POA: Diagnosis not present

## 2017-02-12 DIAGNOSIS — M545 Low back pain: Secondary | ICD-10-CM | POA: Diagnosis not present

## 2017-02-12 DIAGNOSIS — M6281 Muscle weakness (generalized): Secondary | ICD-10-CM | POA: Diagnosis not present

## 2017-02-12 DIAGNOSIS — M25512 Pain in left shoulder: Secondary | ICD-10-CM | POA: Diagnosis not present

## 2017-02-13 ENCOUNTER — Ambulatory Visit (INDEPENDENT_AMBULATORY_CARE_PROVIDER_SITE_OTHER): Payer: Medicare Other | Admitting: Diagnostic Neuroimaging

## 2017-02-13 ENCOUNTER — Encounter: Payer: Self-pay | Admitting: Diagnostic Neuroimaging

## 2017-02-13 VITALS — BP 119/68 | HR 76 | Wt 168.4 lb

## 2017-02-13 DIAGNOSIS — G63 Polyneuropathy in diseases classified elsewhere: Secondary | ICD-10-CM

## 2017-02-13 DIAGNOSIS — R269 Unspecified abnormalities of gait and mobility: Secondary | ICD-10-CM | POA: Diagnosis not present

## 2017-02-13 DIAGNOSIS — G20 Parkinson's disease: Secondary | ICD-10-CM

## 2017-02-13 DIAGNOSIS — M3509 Sicca syndrome with other organ involvement: Secondary | ICD-10-CM | POA: Diagnosis not present

## 2017-02-13 DIAGNOSIS — R413 Other amnesia: Secondary | ICD-10-CM

## 2017-02-13 DIAGNOSIS — M3506 Sjogren syndrome with peripheral nervous system involvement: Secondary | ICD-10-CM

## 2017-02-13 MED ORDER — PRAMIPEXOLE DIHYDROCHLORIDE 1 MG PO TABS: 1.0000 mg | ORAL_TABLET | Freq: Three times a day (TID) | ORAL | 4 refills | Status: DC

## 2017-02-13 MED ORDER — CARBIDOPA-LEVODOPA 25-100 MG PO TABS: 2.0000 | ORAL_TABLET | Freq: Three times a day (TID) | ORAL | 4 refills | Status: DC

## 2017-02-13 NOTE — Progress Notes (Signed)
[facility]  PATIENT: [name] DOB: 08-27-1931  REFERRING CLINICIAN:  HISTORY FROM: patient and husband REASON FOR VISIT: follow up   HISTORICAL  CHIEF COMPLAINT:  Chief Complaint  Patient presents with  . Parkinson's disease    rm 4, husband- Jenny Reichmann, "sometimes make silly mistakes when tired; recent falls; my writing is not as good as it was"  . Follow-up    6 month    HISTORY OF PRESENT ILLNESS:   UPDATE 02/13/17: Since last visit, feels stable. Tolerating meds. Some memory issues, esp when tired.   UPDATE 08/17/16: Since last visit, continues with intermittent shaking sens on "inside". Sometimes forgets the mid day dose of carb/levo.  UPDATE 02/16/16: Since last visit, doing well. Memory loss stable. Having some pain in feet. Now has spinal cord stim for back pain issues.   UPDATE 10/13/15: Since last visit, more memory loss problems. Forgetting order when playing bridge. Forgetting recent conversations and names. Had fall in Aug 2016, with right foot fracture.   UPDATE 07/06/15: More issues with kidney stones, UTIs, shoulder pain. No wearing off. No tremor. More balance and freezing issues. Short term memory problems continue. Patient is in denial about need to use walker and her own memory problems.   UPDATE 03/02/15: Since last visit, patient's balance has worsened. She fell recently (02/26/15), with right non-displaced distal right radial fracture. Notes more tremor and wearing off around 4pm everyday. Currently on carb/levo 1.5tab TID and pramipexole 1mg  TID. Sometimes takes a 4th dose of carb/levo 1.5tabs. Had been using a walker, mainly at night time, but did not use at time of fall.  UPDATE 09/03/14: Since last visit, tremor is well controlled. Gait and freezing are stable,. Doing well on (carb/levo 1.5 tabs + pramipexole 1mg ) taken TID (sometimes QID, with extra late evening dose if wearing off is notable). Having more foot pain, numbness,  drooling prob, flushing, anxiety. Saw psychology last visit, then rec to see psychiatry, but sxs improved and the psychiatrist who was recommended to them was not taking new patients.   UPDATE 02/24/14: Since last visit, has increased carb/levo up to 1 tab 4x per day; has also incr pramipexole up to 1mg  4x per day (for convenience of remembering, even though we normally dose BID or TID). Also with more anxiety, decr confidence, more tremor in right upper ext. Also reports some right shoulder, bilateral knee, low back pain. Has noticed some freezing of gait. Has appt with psychology later today.  UPDATE 08/26/13: Since last visit, noticing some wearing off in early evening. Takes carb/levo 6:30am, noon, 9pm. Also getting more "worked up" lately, reading about PD online. No falls. Uses cane and walker. More fatigue and mental exhaustion.  UPDATE 02/04/13: Since last visit, has had some minor falls. tremor and PD sxs better on carb/levo. No anxiety. Some drooling and hypophonia. Not using walker or cane all the time. constipation stable.  UPDATE 10/06/12: Since last visit, had another back surgery (april 2013), dx'd with CHF, dx'd with sjogrens, also with fall in August 2013 with pelvic fracture. Reports good tremor control. Some freezing of gait.  UPDATE 02/13/12: Now on pramipexole 1mg  QID (7am, noon, 5pm, 10pm). Some wearing off around 5pm. Also, had rxn to [facility], now with right leg pain. Also getting lumbar radiculopathy eval per ortho.  UPDATE 08/14/11: Tremor developing around 4pm now.  More anxiety per husband.  Also with UTI x 2.  Now dx'd with left foot fractures (? "stubbed" toes;  no falls).  UPDATE 04/27/11: Tolerating pramipexole 1mg  TID (7am, 1-2pm, 7p).  some wearing off right at 7p.  feels unsteady, and has ongoing back problems and pain.  UPDATE 01/24/11: Doing slightly better with pramipexole 1mg  BID.  Tremor returns later in the day before evening dose.  Still with gait and balance diff.     PRIOR HPI: 83 year old female with history of hypertension, hypercholesterolemia, hypothyroidism, fibromyalgia, restless leg syndrome, presenting for evaluation of tremor for the past 3 years. She is here with her husband. Approximately 3 years ago patient developed intermittent tremor in her left hand particularly in her thumb and fingers.  Tremor is most noticeable at rest and in the evening.  In June 2011, patient tripped, fell and broke her left wrist and forearm.  This was repaired surgically and in a cast; during this time and since then her tremor has improved. Patient also has history of restless leg syndrome for the past 5 years. She's been on pramipexole for number of years (0.5mg  qhs) and recently increased to 1 mg qhs a few weeks ago.  The patient's mother also had restless leg syndrome. Patient has a diagnosis of iron deficiency anemia but is not on iron replacement.   REVIEW OF SYSTEMS: Full 14 system review of systems performed and negative except for trouble swallowing fatigue appetite change walking diff rash constipation shortness double vision.    ALLERGIES: Allergies  Allergen Reactions  . Codeine Other (See Comments)    Reaction:  Headaches and nightmares   . Lactose Intolerance (Gi) Nausea And Vomiting  . Lyrica [Pregabalin] Swelling and Other (See Comments)    Reaction:  Leg swelling  . Other Other (See Comments)    Pt states that pain medications give her nightmares.    . Plaquenil [Hydroxychloroquine Sulfate] Other (See Comments)    Reaction:  GI upset   . Reglan [Metoclopramide] Other (See Comments)    Reaction:  GI upset   . Requip [Ropinirole Hcl] Other (See Comments)    Reaction:  GI upset   . Septra [Sulfamethoxazole-Trimethoprim] Nausea And Vomiting  . Shellfish Allergy Nausea And Vomiting  . Latex Rash    HOME MEDICATIONS: Outpatient Medications Prior to Visit  Medication Sig Dispense Refill  . albuterol (PROAIR HFA) 108 (90 Base) MCG/ACT inhaler  Inhale 2 puffs into the lungs every 4 (four) hours as needed for wheezing or shortness of breath. 1 Inhaler 5  . aspirin EC 81 MG tablet Take 81 mg by mouth at bedtime.     . bacitracin ointment Apply 1 application topically 2 (two) times daily. 28 g 1  . Calcium Carb-Cholecalciferol (CALCIUM 1000 + D) 1000-800 MG-UNIT TABS Take 1 tablet by mouth daily.    . carbidopa-levodopa (SINEMET IR) 25-100 MG tablet Take 2 tablets by mouth 3 (three) times daily. 180 tablet 12  . celecoxib (CELEBREX) 200 MG capsule Take 200 mg by mouth 2 (two) times daily.    . cholecalciferol (VITAMIN D) 1000 units tablet Take 2,000 Units by mouth daily.    Marland Kitchen denosumab (PROLIA) 60 MG/ML SOLN injection Inject 60 mg into the skin every 6 (six) months. Administer in upper arm, thigh, or abdomen    . dexlansoprazole (DEXILANT) 60 MG capsule Take 60 mg by mouth daily.    . diphenhydramine-acetaminophen (TYLENOL PM) 25-500 MG TABS Take 1 tablet by mouth at bedtime.    . docusate sodium (COLACE) 100 MG capsule Take 100 mg by mouth at bedtime.    . DULoxetine (CYMBALTA)  20 MG capsule Take 20 mg by mouth daily.    Marland Kitchen estradiol (ESTRACE) 0.1 MG/GM vaginal cream Place 1 Applicatorful vaginally at bedtime as needed (for dryness).    . ezetimibe (ZETIA) 10 MG tablet Take 10 mg by mouth at bedtime.     . hydroxychloroquine (PLAQUENIL) 200 MG tablet Take 200 mg by mouth daily.   0  . levothyroxine (SYNTHROID, LEVOTHROID) 50 MCG tablet Take 50 mcg by mouth daily before breakfast.     . lidocaine (XYLOCAINE) 5 % ointment Apply 2-3 g topically 4 (four) times daily. Apply to affected area/feet  12  . lovastatin (MEVACOR) 40 MG tablet Take 40 mg by mouth at bedtime.    . metoprolol tartrate (LOPRESSOR) 25 MG tablet Take 1 tablet (25 mg total) by mouth daily. 90 tablet 1  . Multiple Vitamin (MULTIVITAMIN WITH MINERALS) TABS tablet Take 1 tablet by mouth daily.    Vladimir Faster Glycol-Propyl Glycol (SYSTANE) 0.4-0.3 % GEL ophthalmic gel Place 1  application into both eyes at bedtime.    Vladimir Faster Glycol-Propyl Glycol (SYSTANE) 0.4-0.3 % SOLN Place 1 drop into both eyes daily.     . pramipexole (MIRAPEX) 1 MG tablet Take 1 tablet (1 mg total) by mouth 3 (three) times daily. 90 tablet 12  . Skin Protectants, Misc. (EUCERIN) cream Apply 1 application topically as needed (for irritation).     . traMADol (ULTRAM) 50 MG tablet Take 50 mg by mouth 3 (three) times daily as needed for moderate pain.     . traMADol (ULTRAM-ER) 200 MG 24 hr tablet Take 200 mg by mouth daily as needed for pain.     Marland Kitchen trimethoprim (TRIMPEX) 100 MG tablet Take 100 mg by mouth daily.     No facility-administered medications prior to visit.     PAST MEDICAL HISTORY: Past Medical History:  Diagnosis Date  . Anemia   . Anxiety   . Aortic stenosis    mild by echo 05/2016  . Arthritis   . Broken arm    right   . Chronic diastolic CHF (congestive heart failure) ([place])   . Family history of adverse reaction to anesthesia    pt. states sister vomits  . Fibromyalgia   . Fracture of thumb 09/01/2015   right  . Fracture, foot 09/01/2015   right  . GERD (gastroesophageal reflux disease)   . H/O hiatal hernia   . History of bronchitis   . History of kidney stones   . Hypercholesterolemia   . Hypertension    dr t turner  . Hypothyroidism   . Neuropathy ([place])   . Osteoporosis   . Parkinson disease ([place])   . PVD (peripheral vascular disease) ([facility])    99% stenosis of left anteiror tibial artery, mod stenosis of left distal SFA and popliteal artery followed by [name]  . Restless leg   . Shortness of breath   . Sjogren's disease ([place])   . SOB (shortness of breath)    chronic due to diastolic dysfunction, deconditioning, obesity  . Tremor     PAST SURGICAL HISTORY: Past Surgical History:  Procedure Laterality Date  . ABDOMINAL HYSTERECTOMY    . BACK SURGERY     4 back surgeries,   lumbar fusion  . CARDIAC CATHETERIZATION  2006   normal  . EYE  SURGERY Bilateral    cateracts  . falls     variious fall, broken wrist,and toes  . FRACTURE SURGERY Right April 2016   Wrist, Pt.  fell  . SPINAL CORD STIMULATOR INSERTION N/A 09/09/2015   Procedure: LUMBAR SPINAL CORD STIMULATOR INSERTION;  Surgeon: Clydell Hakim, MD;  Location: [facility];  Service: Neurosurgery;  Laterality: N/A;  LUMBAR SPINAL CORD STIMULATOR INSERTION  . SPINE SURGERY  April 2013   Back X's 4  . TOTAL HIP ARTHROPLASTY     right  . WRIST FRACTURE SURGERY Bilateral     FAMILY HISTORY: Family History  Problem Relation Age of Onset  . Heart attack Mother   . Restless legs syndrome Mother   . Heart failure Mother   . Heart disease Mother   . Hypertension Mother   . COPD Father     SOCIAL HISTORY:  Social History   Social History  . Marital status: Married    Spouse name: [name]  . Number of children: 2  . Years of education: HS   Occupational History  .      Homemaker   Social History Main Topics  . Smoking status: Never Smoker  . Smokeless tobacco: Never Used  . Alcohol use No  . Drug use: No  . Sexual activity: No   Other Topics Concern  . Not on file   Social History Narrative   Patient lives at home with her spouse.   Caffeine Use: tea     PHYSICAL EXAM  Vitals:   02/13/17 0754  BP: 119/68  Pulse: 76  Weight: 168 lb 6.4 oz (76.4 kg)    Not recorded     MMSE - Mini Mental State Exam 10/13/2015  Orientation to time 5  Orientation to Place 4  Registration 3  Attention/ Calculation 4  Recall 2  Language- name 2 objects 2  Language- repeat 0  Language- follow 3 step command 3  Language- read & follow direction 1  Write a sentence 1  Copy design 0  Total score 25    Body mass index is 35.2 kg/m.  GENERAL EXAM: General: Patient is awake, alert and in no acute distress.  Well developed and groomed.  MASKED FACIES. Neck: Neck is supple. Cardiovascular: Heart is regular rate and rhythm with no  murmurs.  Neurologic Exam  Mental Status: Awake, alert.  Language is fluent and comprehension intact. Cranial Nerves: Pupils are equal and reactive to light.  Visual fields are full to confrontation.  Conjugate eye movements are full and symmetric.  Facial sensation and strength are symmetric.  Hearing is intact.  Palate elevated symmetrically and uvula is midline.  Shoulder shug is symmetric.  Tongue is midline. Motor: Normal bulk and tone; MILD COGWHEELING IN LUE  WITH REINFORCEMENT.  NO REST OR POSTURAL TREMOR.  BRADYKINESIA IN BUE AND BLE. 4+/5 strength in the upper and lower extremities; EXCEPT LIMITED IN LEFT ARM DUE TO LEFT SHOULDER PAIN Sensory: Intact and symmetric to light touch. Coordination: No ataxia or dysmetria; SLOW MOVEMENTS OVERALL on finger-nose or rapid alternating movement testing. Reflexes: Deep tendon reflexes in the upper extremity are present and symmetric; ABSENT AT KNEES AND ANKLES. Gait and Station: CAUTIOUS GAIT. SLOW TO RISE, AND NEEDS HELP TO STAND UP.  SLOW, DECR ARM SWING. SLOW TURNING. VERY UNSTEADY.    DIAGNOSTIC DATA (LABS, IMAGING, TESTING) - I reviewed patient records, labs, notes, testing and imaging myself where available.  Lab Results  Component Value Date   WBC 10.0 01/13/2017   HGB 10.0 (L) 01/13/2017   HCT 31.5 (L) 01/13/2017   MCV 91.0 01/13/2017   PLT 295 01/13/2017  Component Value Date/Time   NA 141 01/13/2017 1733   K 4.5 01/13/2017 1733   CL 111 01/13/2017 1733   CO2 25 01/13/2017 1733   GLUCOSE 109 (H) 01/13/2017 1733   BUN 20 01/13/2017 1733   CREATININE 1.10 (H) 01/13/2017 1733   CREATININE 0.87 05/03/2016 0926   CALCIUM 9.4 01/13/2017 1733   PROT 6.7 01/13/2017 1733   ALBUMIN 4.1 01/13/2017 1733   AST 29 01/13/2017 1733   ALT 16 01/13/2017 1733   ALKPHOS 55 01/13/2017 1733   BILITOT <0.1 (L) 01/13/2017 1733   GFRNONAA 45 (L) 01/13/2017 1733   GFRAA 52 (L) 01/13/2017 1733   No results found for: CHOL No results  found for: HGBA1C No results found for: VITAMINB12 No results found for: TSH   11/10/10 MRI brain - mild atrophy and minimal chronic small vessel ischemic disease.   ASSESSMENT AND PLAN  83 y.o. female with hypertension, hypercholesterolemia, hypothyroidism, fibromyalgia, restless leg syndrome and parkinson's disease.  Continues with shoulder pain, foot pain, memory loss and gait difficulty, likely related to progression of parkinson's disease.   Dx: idiopathic parkinson's disease (akinetic/rigid) + RLS + memory loss (likely related to parkinson's disease)  Parkinson's disease ([place])  Memory loss  Neuropathy due to Sjogren's syndrome ([place])  Gait difficulty    PLAN: - continue pramipexole 1mg  three time per day - continue carb/levo to 2 tabs three times per day - continue using rolling walker at all times; increase supervision and assistance to avoid falls/injuries - try lidocaine topical cream for foot pain - stay active and continue stimulating activities  Meds ordered this encounter  Medications  . carbidopa-levodopa (SINEMET IR) 25-100 MG tablet    Sig: Take 2 tablets by mouth 3 (three) times daily.    Dispense:  540 tablet    Refill:  4  . pramipexole (MIRAPEX) 1 MG tablet    Sig: Take 1 tablet (1 mg total) by mouth 3 (three) times daily.    Dispense:  270 tablet    Refill:  4   Return in about 9 months (around 11/15/2017).    [name], MD 0/0/1749, 4:49 AM Certified in Neurology, Neurophysiology and Neuroimaging  [facility] [address], [place], [place] [address] [phone]

## 2017-02-14 DIAGNOSIS — S62646D Nondisplaced fracture of proximal phalanx of right little finger, subsequent encounter for fracture with routine healing: Secondary | ICD-10-CM | POA: Diagnosis not present

## 2017-02-15 DIAGNOSIS — G20 Parkinson's disease: Secondary | ICD-10-CM | POA: Diagnosis not present

## 2017-02-15 DIAGNOSIS — R296 Repeated falls: Secondary | ICD-10-CM | POA: Diagnosis not present

## 2017-02-15 DIAGNOSIS — M545 Low back pain: Secondary | ICD-10-CM | POA: Diagnosis not present

## 2017-02-15 DIAGNOSIS — M25512 Pain in left shoulder: Secondary | ICD-10-CM | POA: Diagnosis not present

## 2017-02-15 DIAGNOSIS — M25511 Pain in right shoulder: Secondary | ICD-10-CM | POA: Diagnosis not present

## 2017-02-15 DIAGNOSIS — M6281 Muscle weakness (generalized): Secondary | ICD-10-CM | POA: Diagnosis not present

## 2017-02-19 DIAGNOSIS — M25512 Pain in left shoulder: Secondary | ICD-10-CM | POA: Diagnosis not present

## 2017-02-19 DIAGNOSIS — M6281 Muscle weakness (generalized): Secondary | ICD-10-CM | POA: Diagnosis not present

## 2017-02-19 DIAGNOSIS — R296 Repeated falls: Secondary | ICD-10-CM | POA: Diagnosis not present

## 2017-02-19 DIAGNOSIS — G20 Parkinson's disease: Secondary | ICD-10-CM | POA: Diagnosis not present

## 2017-02-19 DIAGNOSIS — M25511 Pain in right shoulder: Secondary | ICD-10-CM | POA: Diagnosis not present

## 2017-02-19 DIAGNOSIS — M545 Low back pain: Secondary | ICD-10-CM | POA: Diagnosis not present

## 2017-02-20 DIAGNOSIS — K219 Gastro-esophageal reflux disease without esophagitis: Secondary | ICD-10-CM | POA: Diagnosis not present

## 2017-02-20 DIAGNOSIS — Z1211 Encounter for screening for malignant neoplasm of colon: Secondary | ICD-10-CM | POA: Diagnosis not present

## 2017-02-20 DIAGNOSIS — Z86010 Personal history of colonic polyps: Secondary | ICD-10-CM | POA: Diagnosis not present

## 2017-02-20 DIAGNOSIS — Z8719 Personal history of other diseases of the digestive system: Secondary | ICD-10-CM | POA: Diagnosis not present

## 2017-02-22 DIAGNOSIS — R296 Repeated falls: Secondary | ICD-10-CM | POA: Diagnosis not present

## 2017-02-22 DIAGNOSIS — M25512 Pain in left shoulder: Secondary | ICD-10-CM | POA: Diagnosis not present

## 2017-02-22 DIAGNOSIS — G20 Parkinson's disease: Secondary | ICD-10-CM | POA: Diagnosis not present

## 2017-02-22 DIAGNOSIS — M6281 Muscle weakness (generalized): Secondary | ICD-10-CM | POA: Diagnosis not present

## 2017-02-22 DIAGNOSIS — M545 Low back pain: Secondary | ICD-10-CM | POA: Diagnosis not present

## 2017-02-22 DIAGNOSIS — M25511 Pain in right shoulder: Secondary | ICD-10-CM | POA: Diagnosis not present

## 2017-02-26 DIAGNOSIS — M25512 Pain in left shoulder: Secondary | ICD-10-CM | POA: Diagnosis not present

## 2017-02-26 DIAGNOSIS — M25511 Pain in right shoulder: Secondary | ICD-10-CM | POA: Diagnosis not present

## 2017-02-26 DIAGNOSIS — G20 Parkinson's disease: Secondary | ICD-10-CM | POA: Diagnosis not present

## 2017-02-26 DIAGNOSIS — M545 Low back pain: Secondary | ICD-10-CM | POA: Diagnosis not present

## 2017-02-26 DIAGNOSIS — M6281 Muscle weakness (generalized): Secondary | ICD-10-CM | POA: Diagnosis not present

## 2017-02-26 DIAGNOSIS — R296 Repeated falls: Secondary | ICD-10-CM | POA: Diagnosis not present

## 2017-03-05 DIAGNOSIS — M545 Low back pain: Secondary | ICD-10-CM | POA: Diagnosis not present

## 2017-03-05 DIAGNOSIS — M25512 Pain in left shoulder: Secondary | ICD-10-CM | POA: Diagnosis not present

## 2017-03-05 DIAGNOSIS — G20 Parkinson's disease: Secondary | ICD-10-CM | POA: Diagnosis not present

## 2017-03-05 DIAGNOSIS — M6281 Muscle weakness (generalized): Secondary | ICD-10-CM | POA: Diagnosis not present

## 2017-03-05 DIAGNOSIS — M25511 Pain in right shoulder: Secondary | ICD-10-CM | POA: Diagnosis not present

## 2017-03-05 DIAGNOSIS — R296 Repeated falls: Secondary | ICD-10-CM | POA: Diagnosis not present

## 2017-03-08 DIAGNOSIS — M25512 Pain in left shoulder: Secondary | ICD-10-CM | POA: Diagnosis not present

## 2017-03-08 DIAGNOSIS — G20 Parkinson's disease: Secondary | ICD-10-CM | POA: Diagnosis not present

## 2017-03-08 DIAGNOSIS — M545 Low back pain: Secondary | ICD-10-CM | POA: Diagnosis not present

## 2017-03-08 DIAGNOSIS — R296 Repeated falls: Secondary | ICD-10-CM | POA: Diagnosis not present

## 2017-03-08 DIAGNOSIS — M25511 Pain in right shoulder: Secondary | ICD-10-CM | POA: Diagnosis not present

## 2017-03-08 DIAGNOSIS — M6281 Muscle weakness (generalized): Secondary | ICD-10-CM | POA: Diagnosis not present

## 2017-03-12 DIAGNOSIS — R29898 Other symptoms and signs involving the musculoskeletal system: Secondary | ICD-10-CM | POA: Diagnosis not present

## 2017-03-12 DIAGNOSIS — M25531 Pain in right wrist: Secondary | ICD-10-CM | POA: Diagnosis not present

## 2017-03-12 DIAGNOSIS — M545 Low back pain: Secondary | ICD-10-CM | POA: Diagnosis not present

## 2017-03-12 DIAGNOSIS — G20 Parkinson's disease: Secondary | ICD-10-CM | POA: Diagnosis not present

## 2017-03-12 DIAGNOSIS — R278 Other lack of coordination: Secondary | ICD-10-CM | POA: Diagnosis not present

## 2017-03-12 DIAGNOSIS — M25512 Pain in left shoulder: Secondary | ICD-10-CM | POA: Diagnosis not present

## 2017-03-12 DIAGNOSIS — M25511 Pain in right shoulder: Secondary | ICD-10-CM | POA: Diagnosis not present

## 2017-03-12 DIAGNOSIS — R2681 Unsteadiness on feet: Secondary | ICD-10-CM | POA: Diagnosis not present

## 2017-03-12 DIAGNOSIS — R2689 Other abnormalities of gait and mobility: Secondary | ICD-10-CM | POA: Diagnosis not present

## 2017-03-12 DIAGNOSIS — M79601 Pain in right arm: Secondary | ICD-10-CM | POA: Diagnosis not present

## 2017-03-15 ENCOUNTER — Other Ambulatory Visit: Payer: Self-pay | Admitting: Family Medicine

## 2017-03-15 DIAGNOSIS — M545 Low back pain: Secondary | ICD-10-CM | POA: Diagnosis not present

## 2017-03-15 DIAGNOSIS — M25512 Pain in left shoulder: Secondary | ICD-10-CM | POA: Diagnosis not present

## 2017-03-15 DIAGNOSIS — N644 Mastodynia: Secondary | ICD-10-CM

## 2017-03-15 DIAGNOSIS — R2681 Unsteadiness on feet: Secondary | ICD-10-CM | POA: Diagnosis not present

## 2017-03-15 DIAGNOSIS — R2689 Other abnormalities of gait and mobility: Secondary | ICD-10-CM | POA: Diagnosis not present

## 2017-03-15 DIAGNOSIS — G20 Parkinson's disease: Secondary | ICD-10-CM | POA: Diagnosis not present

## 2017-03-15 DIAGNOSIS — M25511 Pain in right shoulder: Secondary | ICD-10-CM | POA: Diagnosis not present

## 2017-03-19 DIAGNOSIS — R2681 Unsteadiness on feet: Secondary | ICD-10-CM | POA: Diagnosis not present

## 2017-03-19 DIAGNOSIS — R2689 Other abnormalities of gait and mobility: Secondary | ICD-10-CM | POA: Diagnosis not present

## 2017-03-19 DIAGNOSIS — M545 Low back pain: Secondary | ICD-10-CM | POA: Diagnosis not present

## 2017-03-19 DIAGNOSIS — M25512 Pain in left shoulder: Secondary | ICD-10-CM | POA: Diagnosis not present

## 2017-03-19 DIAGNOSIS — M25511 Pain in right shoulder: Secondary | ICD-10-CM | POA: Diagnosis not present

## 2017-03-19 DIAGNOSIS — G20 Parkinson's disease: Secondary | ICD-10-CM | POA: Diagnosis not present

## 2017-03-20 DIAGNOSIS — M8000XA Age-related osteoporosis with current pathological fracture, unspecified site, initial encounter for fracture: Secondary | ICD-10-CM | POA: Diagnosis not present

## 2017-03-22 ENCOUNTER — Other Ambulatory Visit: Payer: Self-pay | Admitting: Family Medicine

## 2017-03-22 ENCOUNTER — Ambulatory Visit
Admission: RE | Admit: 2017-03-22 | Discharge: 2017-03-22 | Disposition: A | Discharge status: Home / Self Care | Payer: Medicare Other | Source: Ambulatory Visit | Attending: Family Medicine | Admitting: Family Medicine

## 2017-03-22 ENCOUNTER — Other Ambulatory Visit: Payer: Self-pay | Admitting: Diagnostic Neuroimaging

## 2017-03-22 DIAGNOSIS — N644 Mastodynia: Secondary | ICD-10-CM

## 2017-03-22 DIAGNOSIS — N6489 Other specified disorders of breast: Secondary | ICD-10-CM | POA: Diagnosis not present

## 2017-03-22 DIAGNOSIS — R928 Other abnormal and inconclusive findings on diagnostic imaging of breast: Secondary | ICD-10-CM | POA: Diagnosis not present

## 2017-03-25 DIAGNOSIS — Z86010 Personal history of colonic polyps: Secondary | ICD-10-CM | POA: Diagnosis not present

## 2017-03-26 ENCOUNTER — Other Ambulatory Visit: Payer: Self-pay | Admitting: Diagnostic Neuroimaging

## 2017-03-27 ENCOUNTER — Telehealth: Payer: Self-pay | Admitting: *Deleted

## 2017-03-27 NOTE — Telephone Encounter (Signed)
Received denial for lidocaine 5% ointment.  (stating not being used for FDA labeled or medically excepted use. Do you want to appeal or use something else?

## 2017-03-27 NOTE — Telephone Encounter (Signed)
That is most affordable option. No other option. -VRP

## 2017-03-27 NOTE — Telephone Encounter (Signed)
LMVM for pt  To return call.

## 2017-03-28 DIAGNOSIS — I739 Peripheral vascular disease, unspecified: Secondary | ICD-10-CM | POA: Diagnosis not present

## 2017-03-28 DIAGNOSIS — M2550 Pain in unspecified joint: Secondary | ICD-10-CM | POA: Diagnosis not present

## 2017-03-28 DIAGNOSIS — I5032 Chronic diastolic (congestive) heart failure: Secondary | ICD-10-CM | POA: Diagnosis not present

## 2017-03-28 DIAGNOSIS — G20 Parkinson's disease: Secondary | ICD-10-CM | POA: Diagnosis not present

## 2017-03-28 DIAGNOSIS — I10 Essential (primary) hypertension: Secondary | ICD-10-CM | POA: Diagnosis not present

## 2017-03-28 DIAGNOSIS — R296 Repeated falls: Secondary | ICD-10-CM | POA: Diagnosis not present

## 2017-03-28 DIAGNOSIS — G609 Hereditary and idiopathic neuropathy, unspecified: Secondary | ICD-10-CM | POA: Diagnosis not present

## 2017-03-28 NOTE — Telephone Encounter (Signed)
Called and LMVM (home) for pt that lidocaine ointment was denied by [facility], this was the most affordable option and there are no other options.  She is to call back if questions.

## 2017-04-10 DIAGNOSIS — M25562 Pain in left knee: Secondary | ICD-10-CM | POA: Diagnosis not present

## 2017-04-10 DIAGNOSIS — M154 Erosive (osteo)arthritis: Secondary | ICD-10-CM | POA: Diagnosis not present

## 2017-04-10 DIAGNOSIS — R2681 Unsteadiness on feet: Secondary | ICD-10-CM | POA: Diagnosis not present

## 2017-04-10 DIAGNOSIS — M79641 Pain in right hand: Secondary | ICD-10-CM | POA: Diagnosis not present

## 2017-04-10 DIAGNOSIS — M25512 Pain in left shoulder: Secondary | ICD-10-CM | POA: Diagnosis not present

## 2017-04-10 DIAGNOSIS — Z6834 Body mass index (BMI) 34.0-34.9, adult: Secondary | ICD-10-CM | POA: Diagnosis not present

## 2017-04-10 DIAGNOSIS — M79601 Pain in right arm: Secondary | ICD-10-CM | POA: Diagnosis not present

## 2017-04-10 DIAGNOSIS — M25511 Pain in right shoulder: Secondary | ICD-10-CM | POA: Diagnosis not present

## 2017-04-10 DIAGNOSIS — M3500 Sicca syndrome, unspecified: Secondary | ICD-10-CM | POA: Diagnosis not present

## 2017-04-10 DIAGNOSIS — G20 Parkinson's disease: Secondary | ICD-10-CM | POA: Diagnosis not present

## 2017-04-10 DIAGNOSIS — M6281 Muscle weakness (generalized): Secondary | ICD-10-CM | POA: Diagnosis not present

## 2017-04-10 DIAGNOSIS — M25531 Pain in right wrist: Secondary | ICD-10-CM | POA: Diagnosis not present

## 2017-04-10 DIAGNOSIS — Z79899 Other long term (current) drug therapy: Secondary | ICD-10-CM | POA: Diagnosis not present

## 2017-04-10 DIAGNOSIS — R29898 Other symptoms and signs involving the musculoskeletal system: Secondary | ICD-10-CM | POA: Diagnosis not present

## 2017-04-10 DIAGNOSIS — E669 Obesity, unspecified: Secondary | ICD-10-CM | POA: Diagnosis not present

## 2017-04-10 DIAGNOSIS — M545 Low back pain: Secondary | ICD-10-CM | POA: Diagnosis not present

## 2017-04-10 DIAGNOSIS — M79642 Pain in left hand: Secondary | ICD-10-CM | POA: Diagnosis not present

## 2017-04-10 DIAGNOSIS — R278 Other lack of coordination: Secondary | ICD-10-CM | POA: Diagnosis not present

## 2017-04-16 ENCOUNTER — Other Ambulatory Visit: Payer: Self-pay | Admitting: Cardiology

## 2017-04-16 DIAGNOSIS — M545 Low back pain: Secondary | ICD-10-CM | POA: Diagnosis not present

## 2017-04-16 DIAGNOSIS — R278 Other lack of coordination: Secondary | ICD-10-CM | POA: Diagnosis not present

## 2017-04-16 DIAGNOSIS — M25512 Pain in left shoulder: Secondary | ICD-10-CM | POA: Diagnosis not present

## 2017-04-16 DIAGNOSIS — G20 Parkinson's disease: Secondary | ICD-10-CM | POA: Diagnosis not present

## 2017-04-16 DIAGNOSIS — M6281 Muscle weakness (generalized): Secondary | ICD-10-CM | POA: Diagnosis not present

## 2017-04-16 DIAGNOSIS — M25511 Pain in right shoulder: Secondary | ICD-10-CM | POA: Diagnosis not present

## 2017-04-19 DIAGNOSIS — G20 Parkinson's disease: Secondary | ICD-10-CM | POA: Diagnosis not present

## 2017-04-19 DIAGNOSIS — M25511 Pain in right shoulder: Secondary | ICD-10-CM | POA: Diagnosis not present

## 2017-04-19 DIAGNOSIS — M6281 Muscle weakness (generalized): Secondary | ICD-10-CM | POA: Diagnosis not present

## 2017-04-19 DIAGNOSIS — R278 Other lack of coordination: Secondary | ICD-10-CM | POA: Diagnosis not present

## 2017-04-19 DIAGNOSIS — M25512 Pain in left shoulder: Secondary | ICD-10-CM | POA: Diagnosis not present

## 2017-04-19 DIAGNOSIS — M545 Low back pain: Secondary | ICD-10-CM | POA: Diagnosis not present

## 2017-04-22 NOTE — Telephone Encounter (Signed)
Pt husband calling re: the lidocaine, stating he will pay out of pocket. Dr Leta Baptist just needs to send over another prescription to the pharmacy.  Please contact Mr Blacksher

## 2017-04-23 DIAGNOSIS — M6281 Muscle weakness (generalized): Secondary | ICD-10-CM | POA: Diagnosis not present

## 2017-04-23 DIAGNOSIS — G20 Parkinson's disease: Secondary | ICD-10-CM | POA: Diagnosis not present

## 2017-04-23 DIAGNOSIS — M25511 Pain in right shoulder: Secondary | ICD-10-CM | POA: Diagnosis not present

## 2017-04-23 DIAGNOSIS — R278 Other lack of coordination: Secondary | ICD-10-CM | POA: Diagnosis not present

## 2017-04-23 DIAGNOSIS — M25512 Pain in left shoulder: Secondary | ICD-10-CM | POA: Diagnosis not present

## 2017-04-23 DIAGNOSIS — M545 Low back pain: Secondary | ICD-10-CM | POA: Diagnosis not present

## 2017-04-23 MED ORDER — LIDOCAINE 5 % EX OINT: 2.0000 "application " | TOPICAL_OINTMENT | Freq: Four times a day (QID) | CUTANEOUS | 12 refills | Status: DC

## 2017-04-23 NOTE — Addendum Note (Signed)
Addended by: Minna Antis on: 04/23/2017 09:22 AM   Modules accepted: Orders

## 2017-04-26 DIAGNOSIS — M6281 Muscle weakness (generalized): Secondary | ICD-10-CM | POA: Diagnosis not present

## 2017-04-26 DIAGNOSIS — M25512 Pain in left shoulder: Secondary | ICD-10-CM | POA: Diagnosis not present

## 2017-04-26 DIAGNOSIS — G20 Parkinson's disease: Secondary | ICD-10-CM | POA: Diagnosis not present

## 2017-04-26 DIAGNOSIS — R278 Other lack of coordination: Secondary | ICD-10-CM | POA: Diagnosis not present

## 2017-04-26 DIAGNOSIS — M25511 Pain in right shoulder: Secondary | ICD-10-CM | POA: Diagnosis not present

## 2017-04-26 DIAGNOSIS — M545 Low back pain: Secondary | ICD-10-CM | POA: Diagnosis not present

## 2017-04-30 DIAGNOSIS — M545 Low back pain: Secondary | ICD-10-CM | POA: Diagnosis not present

## 2017-04-30 DIAGNOSIS — G20 Parkinson's disease: Secondary | ICD-10-CM | POA: Diagnosis not present

## 2017-04-30 DIAGNOSIS — M25512 Pain in left shoulder: Secondary | ICD-10-CM | POA: Diagnosis not present

## 2017-04-30 DIAGNOSIS — M25511 Pain in right shoulder: Secondary | ICD-10-CM | POA: Diagnosis not present

## 2017-04-30 DIAGNOSIS — R278 Other lack of coordination: Secondary | ICD-10-CM | POA: Diagnosis not present

## 2017-04-30 DIAGNOSIS — M6281 Muscle weakness (generalized): Secondary | ICD-10-CM | POA: Diagnosis not present

## 2017-05-03 DIAGNOSIS — G20 Parkinson's disease: Secondary | ICD-10-CM | POA: Diagnosis not present

## 2017-05-03 DIAGNOSIS — M25511 Pain in right shoulder: Secondary | ICD-10-CM | POA: Diagnosis not present

## 2017-05-03 DIAGNOSIS — R278 Other lack of coordination: Secondary | ICD-10-CM | POA: Diagnosis not present

## 2017-05-03 DIAGNOSIS — M25512 Pain in left shoulder: Secondary | ICD-10-CM | POA: Diagnosis not present

## 2017-05-03 DIAGNOSIS — M6281 Muscle weakness (generalized): Secondary | ICD-10-CM | POA: Diagnosis not present

## 2017-05-03 DIAGNOSIS — M545 Low back pain: Secondary | ICD-10-CM | POA: Diagnosis not present

## 2017-05-07 DIAGNOSIS — M545 Low back pain: Secondary | ICD-10-CM | POA: Diagnosis not present

## 2017-05-07 DIAGNOSIS — G20 Parkinson's disease: Secondary | ICD-10-CM | POA: Diagnosis not present

## 2017-05-07 DIAGNOSIS — R278 Other lack of coordination: Secondary | ICD-10-CM | POA: Diagnosis not present

## 2017-05-07 DIAGNOSIS — M6281 Muscle weakness (generalized): Secondary | ICD-10-CM | POA: Diagnosis not present

## 2017-05-07 DIAGNOSIS — M25511 Pain in right shoulder: Secondary | ICD-10-CM | POA: Diagnosis not present

## 2017-05-07 DIAGNOSIS — M25512 Pain in left shoulder: Secondary | ICD-10-CM | POA: Diagnosis not present

## 2017-05-10 DIAGNOSIS — M25511 Pain in right shoulder: Secondary | ICD-10-CM | POA: Diagnosis not present

## 2017-05-10 DIAGNOSIS — M6281 Muscle weakness (generalized): Secondary | ICD-10-CM | POA: Diagnosis not present

## 2017-05-10 DIAGNOSIS — G20 Parkinson's disease: Secondary | ICD-10-CM | POA: Diagnosis not present

## 2017-05-10 DIAGNOSIS — R296 Repeated falls: Secondary | ICD-10-CM | POA: Diagnosis not present

## 2017-05-10 DIAGNOSIS — M545 Low back pain: Secondary | ICD-10-CM | POA: Diagnosis not present

## 2017-05-10 DIAGNOSIS — R2681 Unsteadiness on feet: Secondary | ICD-10-CM | POA: Diagnosis not present

## 2017-05-10 DIAGNOSIS — M79601 Pain in right arm: Secondary | ICD-10-CM | POA: Diagnosis not present

## 2017-05-10 DIAGNOSIS — M25531 Pain in right wrist: Secondary | ICD-10-CM | POA: Diagnosis not present

## 2017-05-10 DIAGNOSIS — M25512 Pain in left shoulder: Secondary | ICD-10-CM | POA: Diagnosis not present

## 2017-05-10 DIAGNOSIS — R278 Other lack of coordination: Secondary | ICD-10-CM | POA: Diagnosis not present

## 2017-05-10 DIAGNOSIS — R29898 Other symptoms and signs involving the musculoskeletal system: Secondary | ICD-10-CM | POA: Diagnosis not present

## 2017-05-14 DIAGNOSIS — R296 Repeated falls: Secondary | ICD-10-CM | POA: Diagnosis not present

## 2017-05-14 DIAGNOSIS — M6281 Muscle weakness (generalized): Secondary | ICD-10-CM | POA: Diagnosis not present

## 2017-05-14 DIAGNOSIS — M25511 Pain in right shoulder: Secondary | ICD-10-CM | POA: Diagnosis not present

## 2017-05-14 DIAGNOSIS — M25512 Pain in left shoulder: Secondary | ICD-10-CM | POA: Diagnosis not present

## 2017-05-14 DIAGNOSIS — M545 Low back pain: Secondary | ICD-10-CM | POA: Diagnosis not present

## 2017-05-14 DIAGNOSIS — G20 Parkinson's disease: Secondary | ICD-10-CM | POA: Diagnosis not present

## 2017-05-21 DIAGNOSIS — G20 Parkinson's disease: Secondary | ICD-10-CM | POA: Diagnosis not present

## 2017-05-21 DIAGNOSIS — M6281 Muscle weakness (generalized): Secondary | ICD-10-CM | POA: Diagnosis not present

## 2017-05-21 DIAGNOSIS — M25512 Pain in left shoulder: Secondary | ICD-10-CM | POA: Diagnosis not present

## 2017-05-21 DIAGNOSIS — M545 Low back pain: Secondary | ICD-10-CM | POA: Diagnosis not present

## 2017-05-21 DIAGNOSIS — M25511 Pain in right shoulder: Secondary | ICD-10-CM | POA: Diagnosis not present

## 2017-05-21 DIAGNOSIS — R296 Repeated falls: Secondary | ICD-10-CM | POA: Diagnosis not present

## 2017-05-28 DIAGNOSIS — M25512 Pain in left shoulder: Secondary | ICD-10-CM | POA: Diagnosis not present

## 2017-05-28 DIAGNOSIS — M25511 Pain in right shoulder: Secondary | ICD-10-CM | POA: Diagnosis not present

## 2017-05-28 DIAGNOSIS — R296 Repeated falls: Secondary | ICD-10-CM | POA: Diagnosis not present

## 2017-05-28 DIAGNOSIS — M545 Low back pain: Secondary | ICD-10-CM | POA: Diagnosis not present

## 2017-05-28 DIAGNOSIS — G20 Parkinson's disease: Secondary | ICD-10-CM | POA: Diagnosis not present

## 2017-05-28 DIAGNOSIS — M6281 Muscle weakness (generalized): Secondary | ICD-10-CM | POA: Diagnosis not present

## 2017-06-04 DIAGNOSIS — M545 Low back pain: Secondary | ICD-10-CM | POA: Diagnosis not present

## 2017-06-04 DIAGNOSIS — M25512 Pain in left shoulder: Secondary | ICD-10-CM | POA: Diagnosis not present

## 2017-06-04 DIAGNOSIS — M25511 Pain in right shoulder: Secondary | ICD-10-CM | POA: Diagnosis not present

## 2017-06-04 DIAGNOSIS — G20 Parkinson's disease: Secondary | ICD-10-CM | POA: Diagnosis not present

## 2017-06-04 DIAGNOSIS — R296 Repeated falls: Secondary | ICD-10-CM | POA: Diagnosis not present

## 2017-06-04 DIAGNOSIS — M6281 Muscle weakness (generalized): Secondary | ICD-10-CM | POA: Diagnosis not present

## 2017-06-06 DIAGNOSIS — L910 Hypertrophic scar: Secondary | ICD-10-CM | POA: Diagnosis not present

## 2017-06-06 DIAGNOSIS — L570 Actinic keratosis: Secondary | ICD-10-CM | POA: Diagnosis not present

## 2017-06-11 DIAGNOSIS — M545 Low back pain: Secondary | ICD-10-CM | POA: Diagnosis not present

## 2017-06-11 DIAGNOSIS — M25511 Pain in right shoulder: Secondary | ICD-10-CM | POA: Diagnosis not present

## 2017-06-11 DIAGNOSIS — R2681 Unsteadiness on feet: Secondary | ICD-10-CM | POA: Diagnosis not present

## 2017-06-11 DIAGNOSIS — M25531 Pain in right wrist: Secondary | ICD-10-CM | POA: Diagnosis not present

## 2017-06-11 DIAGNOSIS — M6281 Muscle weakness (generalized): Secondary | ICD-10-CM | POA: Diagnosis not present

## 2017-06-11 DIAGNOSIS — M79601 Pain in right arm: Secondary | ICD-10-CM | POA: Diagnosis not present

## 2017-06-11 DIAGNOSIS — R278 Other lack of coordination: Secondary | ICD-10-CM | POA: Diagnosis not present

## 2017-06-11 DIAGNOSIS — R29898 Other symptoms and signs involving the musculoskeletal system: Secondary | ICD-10-CM | POA: Diagnosis not present

## 2017-06-11 DIAGNOSIS — M25512 Pain in left shoulder: Secondary | ICD-10-CM | POA: Diagnosis not present

## 2017-06-11 DIAGNOSIS — G20 Parkinson's disease: Secondary | ICD-10-CM | POA: Diagnosis not present

## 2017-06-25 DIAGNOSIS — M25512 Pain in left shoulder: Secondary | ICD-10-CM | POA: Diagnosis not present

## 2017-06-25 DIAGNOSIS — G20 Parkinson's disease: Secondary | ICD-10-CM | POA: Diagnosis not present

## 2017-06-25 DIAGNOSIS — M6281 Muscle weakness (generalized): Secondary | ICD-10-CM | POA: Diagnosis not present

## 2017-06-25 DIAGNOSIS — R278 Other lack of coordination: Secondary | ICD-10-CM | POA: Diagnosis not present

## 2017-06-25 DIAGNOSIS — M545 Low back pain: Secondary | ICD-10-CM | POA: Diagnosis not present

## 2017-06-25 DIAGNOSIS — M25511 Pain in right shoulder: Secondary | ICD-10-CM | POA: Diagnosis not present

## 2017-06-28 ENCOUNTER — Encounter: Payer: Self-pay | Admitting: Family

## 2017-06-28 DIAGNOSIS — G20 Parkinson's disease: Secondary | ICD-10-CM | POA: Diagnosis not present

## 2017-06-28 DIAGNOSIS — M25512 Pain in left shoulder: Secondary | ICD-10-CM | POA: Diagnosis not present

## 2017-06-28 DIAGNOSIS — M545 Low back pain: Secondary | ICD-10-CM | POA: Diagnosis not present

## 2017-06-28 DIAGNOSIS — M25511 Pain in right shoulder: Secondary | ICD-10-CM | POA: Diagnosis not present

## 2017-06-28 DIAGNOSIS — M6281 Muscle weakness (generalized): Secondary | ICD-10-CM | POA: Diagnosis not present

## 2017-06-28 DIAGNOSIS — R278 Other lack of coordination: Secondary | ICD-10-CM | POA: Diagnosis not present

## 2017-07-01 DIAGNOSIS — M6281 Muscle weakness (generalized): Secondary | ICD-10-CM | POA: Diagnosis not present

## 2017-07-01 DIAGNOSIS — M25511 Pain in right shoulder: Secondary | ICD-10-CM | POA: Diagnosis not present

## 2017-07-01 DIAGNOSIS — M25512 Pain in left shoulder: Secondary | ICD-10-CM | POA: Diagnosis not present

## 2017-07-01 DIAGNOSIS — G20 Parkinson's disease: Secondary | ICD-10-CM | POA: Diagnosis not present

## 2017-07-01 DIAGNOSIS — M545 Low back pain: Secondary | ICD-10-CM | POA: Diagnosis not present

## 2017-07-01 DIAGNOSIS — R278 Other lack of coordination: Secondary | ICD-10-CM | POA: Diagnosis not present

## 2017-07-04 ENCOUNTER — Ambulatory Visit (INDEPENDENT_AMBULATORY_CARE_PROVIDER_SITE_OTHER): Payer: Medicare Other | Admitting: Family

## 2017-07-04 ENCOUNTER — Encounter: Payer: Self-pay | Admitting: Family

## 2017-07-04 ENCOUNTER — Ambulatory Visit (HOSPITAL_COMMUNITY)
Admission: RE | Admit: 2017-07-04 | Discharge: 2017-07-04 | Disposition: A | Discharge status: Home / Self Care | Payer: Medicare Other | Source: Ambulatory Visit | Attending: Vascular Surgery | Admitting: Vascular Surgery

## 2017-07-04 VITALS — BP 142/68 | HR 73 | Temp 98.3°F | Resp 18 | Ht <= 58 in | Wt 165.9 lb

## 2017-07-04 DIAGNOSIS — I779 Disorder of arteries and arterioles, unspecified: Secondary | ICD-10-CM | POA: Diagnosis not present

## 2017-07-04 NOTE — Patient Instructions (Signed)

## 2017-07-04 NOTE — Progress Notes (Signed)
[facility] [place]   CC: Follow up peripheral artery occlusive disease  History of Present Illness [name] is a 84 y.o. female  patient of [name] followed for a history of mild claudication. She has a history of tight stenosis in her left anterior tibial artery as well as popliteal occlusive disease She returns today for 6 months follow up. She does not have claudication symptoms, her walking seems limited by neurogenic claudication and back issues, had several lumbar spine surgeries.  She denies non-healing wounds.   She had left first ingrown toenail removed about 2012, since then she has redness at the tip of this toe. Her podiatrist is Dr. Paulino Door. She has a remote history of left metatarsal fracture.  She has pins and needles feelings at toes, plantar surfaces, and heels of both feet, mostly at night.  Had 4 lumbar spine surgeries and has what sounds like right sciatic pain at times.   She is seeing a gastroenterologist, states she has a large hiatal hernia.  She fractured her right wrist in March 2016, had ORIF. She developed kidney stones, saw a urologist, Dr. Kendell Bane at [facility].   She has multiple areas of muscoskeletal type pain that she attributes to her Sjogren's Syndrome and Parkinson's Disease.  Dr.Penumalli is her neurologist.   Pt Diabetic: No  Pt smoker: non-smoker   Pt meds include:  Statin :Yes  Betablocker: Yes  ASA: Yes  Other anticoagulants/antiplatelets: no   Past Medical History:  Diagnosis Date  . Anemia   . Anxiety   . Aortic stenosis    mild by echo 05/2016  . Arthritis   . Broken arm    right   . Chronic diastolic CHF (congestive heart failure) ([place])   . Family history of adverse reaction to anesthesia    pt. states sister vomits  . Fibromyalgia   . Fracture of thumb 09/01/2015   right  . Fracture, foot 09/01/2015   right  . GERD (gastroesophageal reflux disease)   . H/O hiatal  hernia   . History of bronchitis   . History of kidney stones   . Hypercholesterolemia   . Hypertension    dr t turner  . Hypothyroidism   . Neuropathy   . Osteoporosis   . Parkinson disease ([place])   . PVD (peripheral vascular disease) ([facility])    99% stenosis of left anteiror tibial artery, mod stenosis of left distal SFA and popliteal artery followed by [name]  . Restless leg   . Shortness of breath   . Sjogren's disease ([place])   . SOB (shortness of breath)    chronic due to diastolic dysfunction, deconditioning, obesity  . Tremor     Social History Social History  Substance Use Topics  . Smoking status: Never Smoker  . Smokeless tobacco: Never Used  . Alcohol use No    Family History Family History  Problem Relation Age of Onset  . Heart attack Mother   . Restless legs syndrome Mother   . Heart failure Mother   . Heart disease Mother   . Hypertension Mother   . COPD Father     Past Surgical History:  Procedure Laterality Date  . ABDOMINAL HYSTERECTOMY    . BACK SURGERY     4 back surgeries,   lumbar fusion  . CARDIAC CATHETERIZATION  2006   normal  . EYE SURGERY Bilateral    cateracts  . falls     variious fall,  broken wrist,and toes  . FRACTURE SURGERY Right April 2016   Wrist, Pt. fell  . SPINAL CORD STIMULATOR INSERTION N/A 09/09/2015   Procedure: LUMBAR SPINAL CORD STIMULATOR INSERTION;  Surgeon: Clydell Hakim, MD;  Location: [facility];  Service: Neurosurgery;  Laterality: N/A;  LUMBAR SPINAL CORD STIMULATOR INSERTION  . SPINE SURGERY  April 2013   Back X's 4  . TOTAL HIP ARTHROPLASTY     right  . WRIST FRACTURE SURGERY Bilateral     Allergies  Allergen Reactions  . Codeine Other (See Comments)    Reaction:  Headaches and nightmares   . Lactose Intolerance (Gi) Nausea And Vomiting  . Lyrica [Pregabalin] Swelling and Other (See Comments)    Reaction:  Leg swelling  . Other Other (See Comments)    Pt states that pain medications give her  nightmares.    . Plaquenil [Hydroxychloroquine Sulfate] Other (See Comments)    Reaction:  GI upset   . Reglan [Metoclopramide] Other (See Comments)    Reaction:  GI upset   . Requip [Ropinirole Hcl] Other (See Comments)    Reaction:  GI upset   . Septra [Sulfamethoxazole-Trimethoprim] Nausea And Vomiting  . Shellfish Allergy Nausea And Vomiting  . Latex Rash    Current Outpatient Prescriptions  Medication Sig Dispense Refill  . albuterol (PROAIR HFA) 108 (90 Base) MCG/ACT inhaler Inhale 2 puffs into the lungs every 4 (four) hours as needed for wheezing or shortness of breath. 1 Inhaler 5  . aspirin EC 81 MG tablet Take 81 mg by mouth at bedtime.     . bacitracin ointment Apply 1 application topically 2 (two) times daily. 28 g 1  . Calcium Carb-Cholecalciferol (CALCIUM 1000 + D) 1000-800 MG-UNIT TABS Take 1 tablet by mouth daily.    . carbidopa-levodopa (SINEMET IR) 25-100 MG tablet Take 2 tablets by mouth 3 (three) times daily. 540 tablet 4  . celecoxib (CELEBREX) 200 MG capsule Take 200 mg by mouth 2 (two) times daily.    . cholecalciferol (VITAMIN D) 1000 units tablet Take 2,000 Units by mouth daily.    Marland Kitchen denosumab (PROLIA) 60 MG/ML SOLN injection Inject 60 mg into the skin every 6 (six) months. Administer in upper arm, thigh, or abdomen    . dexlansoprazole (DEXILANT) 60 MG capsule Take 60 mg by mouth daily.    . diphenhydramine-acetaminophen (TYLENOL PM) 25-500 MG TABS Take 1 tablet by mouth at bedtime.    . docusate sodium (COLACE) 100 MG capsule Take 100 mg by mouth at bedtime.    . DULoxetine (CYMBALTA) 20 MG capsule Take 20 mg by mouth daily.    Marland Kitchen estradiol (ESTRACE) 0.1 MG/GM vaginal cream Place 1 Applicatorful vaginally at bedtime as needed (for dryness).    . ezetimibe (ZETIA) 10 MG tablet Take 10 mg by mouth at bedtime.     . hydroxychloroquine (PLAQUENIL) 200 MG tablet Take 200 mg by mouth daily.   0  . levothyroxine (SYNTHROID, LEVOTHROID) 50 MCG tablet Take 50 mcg by  mouth daily before breakfast.     . lidocaine (XYLOCAINE) 5 % ointment Apply 2-3 application topically 4 (four) times daily. Apply to affected area/feet 35.44 g 12  . lovastatin (MEVACOR) 40 MG tablet Take 40 mg by mouth at bedtime.    . metoprolol tartrate (LOPRESSOR) 25 MG tablet Take 1 tablet (25 mg total) by mouth daily. 90 tablet 2  . Multiple Vitamin (MULTIVITAMIN WITH MINERALS) TABS tablet Take 1 tablet by mouth daily.    Marland Kitchen  Polyethyl Glycol-Propyl Glycol (SYSTANE) 0.4-0.3 % GEL ophthalmic gel Place 1 application into both eyes at bedtime.    Vladimir Faster Glycol-Propyl Glycol (SYSTANE) 0.4-0.3 % SOLN Place 1 drop into both eyes daily.     . pramipexole (MIRAPEX) 1 MG tablet Take 1 tablet (1 mg total) by mouth 3 (three) times daily. 270 tablet 4  . Skin Protectants, Misc. (EUCERIN) cream Apply 1 application topically as needed (for irritation).     . traMADol (ULTRAM) 50 MG tablet Take 50 mg by mouth 3 (three) times daily as needed for moderate pain.     . traMADol (ULTRAM-ER) 200 MG 24 hr tablet Take 200 mg by mouth daily as needed for pain.     Marland Kitchen trimethoprim (TRIMPEX) 100 MG tablet Take 100 mg by mouth daily.     No current facility-administered medications for this visit.     ROS: See HPI for pertinent positives and negatives.   Physical Examination  Vitals:   07/04/17 1546 07/04/17 1549  BP: (!) 145/70 (!) 142/68  Pulse: 73   Resp: 18   Temp: 98.3 F (36.8 C)   TempSrc: Oral   SpO2: 97%   Weight: 165 lb 14.4 oz (75.3 kg)   Height: 4\' 10"  (1.473 m)    Body mass index is 34.67 kg/m.  General: A&O x 3, WDWN, obese female.  Gait: normal  Eyes: PERRLA,  Pulmonary: Respirations are non labored, CTAB Cardiac: regular rhythm, + murmur   Carotid Bruits  Left  Right    Negative  Negative    Aorta: is not palpable  Radial pulses: 2+ palpable bilaterally.   VASCULAR EXAM:  Extremities without ischemic changes.  without Gangrene; without open  wounds. No peripheral edema.   LE Pulses  LEFT  RIGHT   FEMORAL  Tender to palpation, no swelling or discoloration, unable to palpate  Tender to palpation, unable to palpate  POPLITEAL  not palpable  Not palpable   POSTERIOR TIBIAL  palpable  not palpable   DORSALIS PEDIS  ANTERIOR TIBIAL  not palpable   palpable    Abdomen: soft, NT, no palpable masses, dry mouth. Skin: no rashes, no ulcers.  Musculoskeletal: no muscle wasting or atrophy, tender to palpation at multiple areas.  Neurologic: A&O X 3; Appropriate Affect, MOTOR FUNCTION: moving all extremities equally, motor strength 4/5 in upper extremities, 3/5 in lower extremities. Speech is fluent/normal. CN 2-12 intact    ASSESSMENT: EMANIE BEHAN is a 83 y.o. female whom we are following for a history of mild claudication. She has a history of tight stenosis in her left anterior tibial artery as well as popliteal occlusive disease.  She does not have claudication symptoms, her walking seems limited by neurogenic neuropathy, aching in her lower legs and feet, as she has significant lumbar spine issues There are no signs of ischemia in her feet/legs.   DATA  ABI (Date: 07/04/2017)  R:   ABI: 1.26 (1.42 on 06-28-16),   PT: tri  DP: tri  TBI:  0.79  L:   ABI: 1.53 (was ),   PT: tri  DP: not documented, appears triphasic  TBI: 0.88  ABI's are not reliable as her vessels are non compressible. Both TBI's remain normal and all waveforms remain triphasic.   PLAN:  Daily seated leg exercises as discussed and demonstrated.  Based on the patient's vascular studies and examination, pt will return to clinic in 1 year with ABI's.    I discussed in depth with the  patient the nature of atherosclerosis, and emphasized the importance of maximal medical management including strict control of blood pressure, blood glucose, and lipid levels, obtaining regular exercise, and  continued cessation of smoking.  The patient is aware that without maximal medical management the underlying atherosclerotic disease process will progress, limiting the benefit of any interventions.  The patient was given information about PAD including signs, symptoms, treatment, what symptoms should prompt the patient to seek immediate medical care, and risk reduction measures to take.  Amanda Chambers, RN, MSN, FNP-C [facility] Phone: [phone]  Clinic MD: Oneida Alar  07/04/17 3:50 PM

## 2017-07-23 ENCOUNTER — Encounter: Payer: Self-pay | Admitting: Physician Assistant

## 2017-07-23 ENCOUNTER — Ambulatory Visit (INDEPENDENT_AMBULATORY_CARE_PROVIDER_SITE_OTHER): Payer: Medicare Other | Admitting: Physician Assistant

## 2017-07-23 VITALS — BP 120/58 | HR 90 | Ht <= 58 in | Wt 166.6 lb

## 2017-07-23 DIAGNOSIS — E7800 Pure hypercholesterolemia, unspecified: Secondary | ICD-10-CM

## 2017-07-23 DIAGNOSIS — I350 Nonrheumatic aortic (valve) stenosis: Secondary | ICD-10-CM | POA: Diagnosis not present

## 2017-07-23 DIAGNOSIS — I5032 Chronic diastolic (congestive) heart failure: Secondary | ICD-10-CM

## 2017-07-23 DIAGNOSIS — I10 Essential (primary) hypertension: Secondary | ICD-10-CM | POA: Diagnosis not present

## 2017-07-23 DIAGNOSIS — G20 Parkinson's disease: Secondary | ICD-10-CM

## 2017-07-23 DIAGNOSIS — I779 Disorder of arteries and arterioles, unspecified: Secondary | ICD-10-CM

## 2017-07-23 NOTE — Patient Instructions (Signed)
Medication Instructions:  Your physician recommends that you continue on your current medications as directed. Please refer to the Current Medication list given to you today.   Labwork: None Ordered   Testing/Procedures: None Ordered   Follow-Up: Your physician wants you to follow-up in: 6 months with Dr. Radford Pax. You will receive a reminder letter in the mail two months in advance. If you don't receive a letter, please call our office to schedule the follow-up appointment.   Any Other Special Instructions Will Be Listed Below (If Applicable).  Please keep track of your blood pressure and pulse and give [place] a call.    If you need a refill on your cardiac medications before your next appointment, please call your pharmacy.

## 2017-07-23 NOTE — Progress Notes (Signed)
Cardiology Office Note    Date:  07/23/2017   ID:  [name], DOB 1931/05/14, MRN [id number]  PCP:  Carol Ada, MD  Cardiologist: Dr. Radford Pax  Chief Complaint  Patient presents with  . Follow-up    Seen for Dr.Turner    History of Present Illness:  [name] is a 83 y.o. female with a history of chronic SOB secondary to diastolic dysfunction, chronic diastolic CHF, deconditioning and obesity. A heart cath in 2006 showed normal coronary arteries and normal LVF with normal LVEDP. She also has a history of HTN, mild aortic stenosis on echo 05/2016, chronic LE edema and PVD followed by [name].  Last seen by Dr. Radford Pax 01/2017 and doing well.  Comes in today accompanied by her husband. She has had several falls secondary to Parkinson's.She has multiple complaints but none related to her heart today. Her heart has been stable. She denies any chest pain, palpitations, dyspnea, dizziness or presyncope. She has chronic dyspnea on exertion. Her heart rate is up a little today but she is asymptomatic.       Past Medical History:  Diagnosis Date  . Anemia   . Anxiety   . Aortic stenosis    mild by echo 05/2016  . Arthritis   . Broken arm    right   . Chronic diastolic CHF (congestive heart failure) ([place])   . Family history of adverse reaction to anesthesia    pt. states sister vomits  . Fibromyalgia   . Fracture of thumb 09/01/2015   right  . Fracture, foot 09/01/2015   right  . GERD (gastroesophageal reflux disease)   . H/O hiatal hernia   . History of bronchitis   . History of kidney stones   . Hypercholesterolemia   . Hypertension    dr t turner  . Hypothyroidism   . Neuropathy   . Osteoporosis   . Parkinson disease ([place])   . PVD (peripheral vascular disease) ([facility])    99% stenosis of left anteiror tibial artery, mod stenosis of left distal SFA and popliteal artery followed by [name]  . Restless leg   . Shortness of breath   . Sjogren's disease  ([place])   . SOB (shortness of breath)    chronic due to diastolic dysfunction, deconditioning, obesity  . Tremor     Past Surgical History:  Procedure Laterality Date  . ABDOMINAL HYSTERECTOMY    . BACK SURGERY     4 back surgeries,   lumbar fusion  . CARDIAC CATHETERIZATION  2006   normal  . EYE SURGERY Bilateral    cateracts  . falls     variious fall, broken wrist,and toes  . FRACTURE SURGERY Right April 2016   Wrist, Pt. fell  . SPINAL CORD STIMULATOR INSERTION N/A 09/09/2015   Procedure: LUMBAR SPINAL CORD STIMULATOR INSERTION;  Surgeon: Clydell Hakim, MD;  Location: [facility];  Service: Neurosurgery;  Laterality: N/A;  LUMBAR SPINAL CORD STIMULATOR INSERTION  . SPINE SURGERY  April 2013   Back X's 4  . TOTAL HIP ARTHROPLASTY     right  . WRIST FRACTURE SURGERY Bilateral     Current Medications: Current Meds  Medication Sig  . albuterol (PROAIR HFA) 108 (90 Base) MCG/ACT inhaler Inhale 2 puffs into the lungs every 4 (four) hours as needed for wheezing or shortness of breath.  Marland Kitchen aspirin EC 81 MG tablet Take 81 mg by mouth at bedtime.   . bacitracin ointment Apply 1  application topically 2 (two) times daily.  . Calcium Carb-Cholecalciferol (CALCIUM 1000 + D) 1000-800 MG-UNIT TABS Take 1 tablet by mouth daily.  . carbidopa-levodopa (SINEMET IR) 25-100 MG tablet Take 2 tablets by mouth 3 (three) times daily.  . celecoxib (CELEBREX) 200 MG capsule Take 200 mg by mouth 2 (two) times daily.  . cholecalciferol (VITAMIN D) 1000 units tablet Take 2,000 Units by mouth daily.  Marland Kitchen denosumab (PROLIA) 60 MG/ML SOLN injection Inject 60 mg into the skin every 6 (six) months. Administer in upper arm, thigh, or abdomen  . dexlansoprazole (DEXILANT) 60 MG capsule Take 60 mg by mouth daily.  . diphenhydramine-acetaminophen (TYLENOL PM) 25-500 MG TABS Take 1 tablet by mouth at bedtime.  . docusate sodium (COLACE) 100 MG capsule Take 100 mg by mouth at bedtime.  . DULoxetine (CYMBALTA) 20 MG  capsule Take 20 mg by mouth daily.  Marland Kitchen estradiol (ESTRACE) 0.1 MG/GM vaginal cream Place 1 Applicatorful vaginally at bedtime as needed (for dryness).  . ezetimibe (ZETIA) 10 MG tablet Take 10 mg by mouth at bedtime.   . hydroxychloroquine (PLAQUENIL) 200 MG tablet Take 200 mg by mouth daily.   Marland Kitchen levothyroxine (SYNTHROID, LEVOTHROID) 50 MCG tablet Take 50 mcg by mouth daily before breakfast.   . lidocaine (XYLOCAINE) 5 % ointment Apply 2-3 application topically 4 (four) times daily. Apply to affected area/feet  . lovastatin (MEVACOR) 40 MG tablet Take 40 mg by mouth at bedtime.  . metoprolol tartrate (LOPRESSOR) 25 MG tablet Take 1 tablet (25 mg total) by mouth daily.  . Multiple Vitamin (MULTIVITAMIN WITH MINERALS) TABS tablet Take 1 tablet by mouth daily.  Vladimir Faster Glycol-Propyl Glycol (SYSTANE) 0.4-0.3 % GEL ophthalmic gel Place 1 application into both eyes at bedtime.  Vladimir Faster Glycol-Propyl Glycol (SYSTANE) 0.4-0.3 % SOLN Place 1 drop into both eyes daily.   . pramipexole (MIRAPEX) 1 MG tablet Take 1 tablet (1 mg total) by mouth 3 (three) times daily.  . Skin Protectants, Misc. (EUCERIN) cream Apply 1 application topically as needed (for irritation).   . traMADol (ULTRAM) 50 MG tablet Take 50 mg by mouth 3 (three) times daily as needed for moderate pain.   . traMADol (ULTRAM-ER) 200 MG 24 hr tablet Take 200 mg by mouth daily as needed for pain.   Marland Kitchen trimethoprim (TRIMPEX) 100 MG tablet Take 100 mg by mouth daily.     Allergies:   Codeine; Lactose intolerance (gi); Lyrica [pregabalin]; Other; Plaquenil [hydroxychloroquine sulfate]; Reglan [metoclopramide]; Requip [ropinirole hcl]; Septra [sulfamethoxazole-trimethoprim]; Shellfish allergy; and Latex   Social History   Social History  . Marital status: Married    Spouse name: [name]  . Number of children: 2  . Years of education: HS   Occupational History  .      Homemaker   Social History Main Topics  . Smoking status:  Never Smoker  . Smokeless tobacco: Never Used  . Alcohol use No  . Drug use: No  . Sexual activity: No   Other Topics Concern  . None   Social History Narrative   Patient lives at home with her spouse.   Caffeine Use: tea     Family History:  The patient's family history includes COPD in her father; Heart attack in her mother; Heart disease in her mother; Heart failure in her mother; Hypertension in her mother; Restless legs syndrome in her mother.   ROS:   Please see the history of present illness.    Review of Systems  Constitution: Negative.  HENT: Negative.   Eyes: Negative.   Cardiovascular: Positive for dyspnea on exertion.  Respiratory: Negative.   Hematologic/Lymphatic: Negative.   Musculoskeletal: Positive for arthritis, muscle weakness, myalgias and stiffness. Negative for joint pain.  Gastrointestinal: Negative.   Genitourinary: Negative.   Neurological: Positive for disturbances in coordination, loss of balance and tremors.       Frequent falls   All other systems reviewed and are negative.   PHYSICAL EXAM:   VS:  BP (!) 120/58   Pulse 90   Ht 4\' 10"  (1.473 m)   Wt 166 lb 9.6 oz (75.6 kg)   BMI 34.82 kg/m   Physical Exam  GEN: Well nourished, well developed, in no acute distress  Neck: no JVD, carotid bruits, or masses ZOXWRUE:AVW;0/9 systolic murmur at the left sternal border Respiratory:  clear to auscultation bilaterally, normal work of breathing GI: soft, nontender, nondistended, + BS Ext: without cyanosis, clubbing, or edema, Good distal pulses bilaterally Neuro:  Alert and Oriented x 3 Psych: euthymic mood, full affect  Wt Readings from Last 3 Encounters:  07/23/17 166 lb 9.6 oz (75.6 kg)  07/04/17 165 lb 14.4 oz (75.3 kg)  02/13/17 168 lb 6.4 oz (76.4 kg)      Studies/Labs Reviewed:   EKG:  EKG is not ordered today.    Recent Labs: 01/13/2017: ALT 16; BUN 20; Creatinine, Ser 1.10; Hemoglobin 10.0; Platelets 295; Potassium 4.5; Sodium 141     Lipid Panel No results found for: CHOL, TRIG, HDL, CHOLHDL, VLDL, LDLCALC, LDLDIRECT  Additional studies/ records that were reviewed today include:   2-D echo 6/14/17Study Conclusions   - Left ventricle: The cavity size was normal. Systolic function was   normal. The estimated ejection fraction was in the range of 50%   to 55%. Wall motion was normal; there were no regional wall   motion abnormalities. Doppler parameters are consistent with   abnormal left ventricular relaxation (grade 1 diastolic   dysfunction). - Aortic valve: Trileaflet; moderately thickened, moderately   calcified leaflets. There was mild stenosis. There was no   regurgitation. Valve area (VTI): 1.52 cm^2. Valve area (Vmax):   1.29 cm^2. Valve area (Vmean): 1.32 cm^2. - Mitral valve: Transvalvular velocity was within the normal range.   There was no evidence for stenosis. There was trivial   regurgitation. - Right ventricle: The cavity size was normal. Wall thickness was   normal. Systolic function was normal. - Atrial septum: No defect or patent foramen ovale was identified   by color flow Doppler. - Tricuspid valve: There was trivial regurgitation. - Pulmonary arteries: Systolic pressure was within the normal   range. PA peak pressure: 14 mm Hg (S).      ASSESSMENT:    1. Essential hypertension   2. Chronic diastolic CHF (congestive heart failure) ([place])   3. Aortic valve stenosis, etiology of cardiac valve disease unspecified   4. Hypercholesterolemia   5. Parkinson's disease ([place])      PLAN:  In order of problems listed above:  Essential hypertension controlled  Chronic diastolic CHF compensated today. No evidence of heart failure on exam. Her heart rate is up at 90 bpm. Patient is asymptomatic. Her husband will keep track of her blood pressure and pulse and call if her resting heart rate remains elevated. We can always increase her metoprolol if needed.  Mild aortic valve stenosis. To have  follow up Echo 05/2018.  Hypercholesterolemia continue Zetia  Parkinson's disease with recent falls using walkers  remained by primary care  Medication Adjustments/Labs and Tests Ordered: Current medicines are reviewed at length with the patient today.  Concerns regarding medicines are outlined above.  Medication changes, Labs and Tests ordered today are listed in the Patient Instructions below. Patient Instructions  Medication Instructions:  Your physician recommends that you continue on your current medications as directed. Please refer to the Current Medication list given to you today.   Labwork: None Ordered   Testing/Procedures: None Ordered   Follow-Up: Your physician wants you to follow-up in: 6 months with Dr. Radford Pax. You will receive a reminder letter in the mail two months in advance. If you don't receive a letter, please call our office to schedule the follow-up appointment.   Any Other Special Instructions Will Be Listed Below (If Applicable).  Please keep track of your blood pressure and pulse and give [place] a call.    If you need a refill on your cardiac medications before your next appointment, please call your pharmacy.      Sumner Boast, PA-C  07/23/2017 2:46 PM    [facility] [address] [place], [place], [place]  [address] Phone: [phone]; Fax: [phone]

## 2017-07-24 DIAGNOSIS — H01005 Unspecified blepharitis left lower eyelid: Secondary | ICD-10-CM | POA: Diagnosis not present

## 2017-07-24 DIAGNOSIS — H01001 Unspecified blepharitis right upper eyelid: Secondary | ICD-10-CM | POA: Diagnosis not present

## 2017-07-24 DIAGNOSIS — H04123 Dry eye syndrome of bilateral lacrimal glands: Secondary | ICD-10-CM | POA: Diagnosis not present

## 2017-07-31 NOTE — Addendum Note (Signed)
Addended by: Babs Dabbs A on: 07/31/2017 01:16 PM   Modules accepted: Orders  

## 2017-08-14 DIAGNOSIS — M8588 Other specified disorders of bone density and structure, other site: Secondary | ICD-10-CM | POA: Diagnosis not present

## 2017-08-14 DIAGNOSIS — M810 Age-related osteoporosis without current pathological fracture: Secondary | ICD-10-CM | POA: Diagnosis not present

## 2017-09-04 DIAGNOSIS — M961 Postlaminectomy syndrome, not elsewhere classified: Secondary | ICD-10-CM | POA: Diagnosis not present

## 2017-09-04 DIAGNOSIS — M47816 Spondylosis without myelopathy or radiculopathy, lumbar region: Secondary | ICD-10-CM | POA: Diagnosis not present

## 2017-09-04 DIAGNOSIS — M7061 Trochanteric bursitis, right hip: Secondary | ICD-10-CM | POA: Diagnosis not present

## 2017-09-04 DIAGNOSIS — Z6833 Body mass index (BMI) 33.0-33.9, adult: Secondary | ICD-10-CM | POA: Diagnosis not present

## 2017-09-04 DIAGNOSIS — M545 Low back pain: Secondary | ICD-10-CM | POA: Diagnosis not present

## 2017-09-08 ENCOUNTER — Other Ambulatory Visit: Payer: Self-pay | Admitting: Diagnostic Neuroimaging

## 2017-09-12 DIAGNOSIS — I5032 Chronic diastolic (congestive) heart failure: Secondary | ICD-10-CM | POA: Diagnosis not present

## 2017-09-12 DIAGNOSIS — E669 Obesity, unspecified: Secondary | ICD-10-CM | POA: Diagnosis not present

## 2017-09-12 DIAGNOSIS — Z0000 Encounter for general adult medical examination without abnormal findings: Secondary | ICD-10-CM | POA: Diagnosis not present

## 2017-09-12 DIAGNOSIS — M3500 Sicca syndrome, unspecified: Secondary | ICD-10-CM | POA: Diagnosis not present

## 2017-09-12 DIAGNOSIS — E782 Mixed hyperlipidemia: Secondary | ICD-10-CM | POA: Diagnosis not present

## 2017-09-12 DIAGNOSIS — G2581 Restless legs syndrome: Secondary | ICD-10-CM | POA: Diagnosis not present

## 2017-09-12 DIAGNOSIS — I10 Essential (primary) hypertension: Secondary | ICD-10-CM | POA: Diagnosis not present

## 2017-09-12 DIAGNOSIS — Z23 Encounter for immunization: Secondary | ICD-10-CM | POA: Diagnosis not present

## 2017-09-12 DIAGNOSIS — G894 Chronic pain syndrome: Secondary | ICD-10-CM | POA: Diagnosis not present

## 2017-09-12 DIAGNOSIS — E039 Hypothyroidism, unspecified: Secondary | ICD-10-CM | POA: Diagnosis not present

## 2017-09-12 DIAGNOSIS — Z1389 Encounter for screening for other disorder: Secondary | ICD-10-CM | POA: Diagnosis not present

## 2017-09-12 DIAGNOSIS — M797 Fibromyalgia: Secondary | ICD-10-CM | POA: Diagnosis not present

## 2017-09-13 ENCOUNTER — Telehealth: Payer: Self-pay | Admitting: *Deleted

## 2017-09-13 NOTE — Telephone Encounter (Signed)
LVM informing patient that this RN rescheduled her FU from 8 am to 10 am on 11/15/17.  Advised her Dr Leta Baptist will be unavailable at 8 am. Requested she call back Monday if the new time is not acceptable for her, left number, advised office is now closed.

## 2017-09-25 DIAGNOSIS — M810 Age-related osteoporosis without current pathological fracture: Secondary | ICD-10-CM | POA: Diagnosis not present

## 2017-09-25 DIAGNOSIS — N200 Calculus of kidney: Secondary | ICD-10-CM | POA: Diagnosis not present

## 2017-10-03 DIAGNOSIS — L910 Hypertrophic scar: Secondary | ICD-10-CM | POA: Diagnosis not present

## 2017-10-07 DIAGNOSIS — M47816 Spondylosis without myelopathy or radiculopathy, lumbar region: Secondary | ICD-10-CM | POA: Diagnosis not present

## 2017-10-14 DIAGNOSIS — E669 Obesity, unspecified: Secondary | ICD-10-CM | POA: Diagnosis not present

## 2017-10-14 DIAGNOSIS — M2550 Pain in unspecified joint: Secondary | ICD-10-CM | POA: Diagnosis not present

## 2017-10-14 DIAGNOSIS — Z79899 Other long term (current) drug therapy: Secondary | ICD-10-CM | POA: Diagnosis not present

## 2017-10-14 DIAGNOSIS — M545 Low back pain: Secondary | ICD-10-CM | POA: Diagnosis not present

## 2017-10-14 DIAGNOSIS — R309 Painful micturition, unspecified: Secondary | ICD-10-CM | POA: Diagnosis not present

## 2017-10-14 DIAGNOSIS — Z6833 Body mass index (BMI) 33.0-33.9, adult: Secondary | ICD-10-CM | POA: Diagnosis not present

## 2017-10-14 DIAGNOSIS — M3500 Sicca syndrome, unspecified: Secondary | ICD-10-CM | POA: Diagnosis not present

## 2017-10-14 DIAGNOSIS — M154 Erosive (osteo)arthritis: Secondary | ICD-10-CM | POA: Diagnosis not present

## 2017-10-23 DIAGNOSIS — Z1231 Encounter for screening mammogram for malignant neoplasm of breast: Secondary | ICD-10-CM | POA: Diagnosis not present

## 2017-11-04 DIAGNOSIS — M47816 Spondylosis without myelopathy or radiculopathy, lumbar region: Secondary | ICD-10-CM | POA: Diagnosis not present

## 2017-11-15 ENCOUNTER — Encounter: Payer: Self-pay | Admitting: Diagnostic Neuroimaging

## 2017-11-15 ENCOUNTER — Ambulatory Visit (INDEPENDENT_AMBULATORY_CARE_PROVIDER_SITE_OTHER): Payer: Medicare Other | Admitting: Diagnostic Neuroimaging

## 2017-11-15 VITALS — BP 126/72 | HR 85 | Wt 162.8 lb

## 2017-11-15 DIAGNOSIS — R269 Unspecified abnormalities of gait and mobility: Secondary | ICD-10-CM

## 2017-11-15 DIAGNOSIS — M3509 Sicca syndrome with other organ involvement: Secondary | ICD-10-CM

## 2017-11-15 DIAGNOSIS — G20 Parkinson's disease: Secondary | ICD-10-CM

## 2017-11-15 DIAGNOSIS — R413 Other amnesia: Secondary | ICD-10-CM

## 2017-11-15 DIAGNOSIS — M3506 Sjogren syndrome with peripheral nervous system involvement: Secondary | ICD-10-CM

## 2017-11-15 DIAGNOSIS — I779 Disorder of arteries and arterioles, unspecified: Secondary | ICD-10-CM

## 2017-11-15 DIAGNOSIS — G63 Polyneuropathy in diseases classified elsewhere: Secondary | ICD-10-CM

## 2017-11-15 MED ORDER — PRAMIPEXOLE DIHYDROCHLORIDE 1 MG PO TABS: 1.0000 mg | ORAL_TABLET | Freq: Three times a day (TID) | ORAL | 4 refills | Status: DC

## 2017-11-15 MED ORDER — CARBIDOPA-LEVODOPA 25-100 MG PO TABS: 2.0000 | ORAL_TABLET | Freq: Three times a day (TID) | ORAL | 4 refills | Status: DC

## 2017-11-15 NOTE — Progress Notes (Signed)
[facility]  PATIENT: [name] DOB: May 31, 1931  REFERRING CLINICIAN:  HISTORY FROM: patient and husband REASON FOR VISIT: follow up   HISTORICAL  CHIEF COMPLAINT:  Chief Complaint  Patient presents with  . Parkinson's disease    rm 7, husband -Jenny Reichmann, "tired all the time, feel off balance sometimes; short term memory difficulties; achiness in my hips down to my knees and ankles"  . Follow-up    9 month    HISTORY OF PRESENT ILLNESS:   UPDATE (11/15/17, VRP): Since last visit, doing about the same. Tolerating meds. No alleviating or aggravating factors. Memory and gait continue to be issues. Planning to go to [place] next spring.   UPDATE 02/13/17:  Since last visit, feels stable. Tolerating meds. Some memory issues, esp when tired.   UPDATE 08/17/16:  Since last visit, continues with intermittent shaking sens on "inside". Sometimes forgets the mid day dose of carb/levo.  UPDATE 02/16/16:  Since last visit, doing well. Memory loss stable. Having some pain in feet. Now has spinal cord stim for back pain issues.   UPDATE 10/13/15:  Since last visit, more memory loss problems. Forgetting order when playing bridge. Forgetting recent conversations and names. Had fall in Aug 2016, with right foot fracture.   UPDATE 07/06/15:  More issues with kidney stones, UTIs, shoulder pain. No wearing off. No tremor. More balance and freezing issues. Short term memory problems continue. Patient is in denial about need to use walker and her own memory problems.   UPDATE 03/02/15:  Since last visit, patient's balance has worsened. She fell recently (02/26/15), with right non-displaced distal right radial fracture. Notes more tremor and wearing off around 4pm everyday. Currently on carb/levo 1.5tab TID and pramipexole 1mg  TID. Sometimes takes a 4th dose of carb/levo 1.5tabs. Had been using a walker, mainly at night time, but did not use at time of fall.  UPDATE 09/03/14:  Since last visit,  tremor is well controlled. Gait and freezing are stable,. Doing well on (carb/levo 1.5 tabs + pramipexole 1mg ) taken TID (sometimes QID, with extra late evening dose if wearing off is notable). Having more foot pain, numbness, drooling prob, flushing, anxiety. Saw psychology last visit, then rec to see psychiatry, but sxs improved and the psychiatrist who was recommended to them was not taking new patients.   UPDATE 02/24/14:  Since last visit, has increased carb/levo up to 1 tab 4x per day; has also incr pramipexole up to 1mg  4x per day (for convenience of remembering, even though we normally dose BID or TID). Also with more anxiety, decr confidence, more tremor in right upper ext. Also reports some right shoulder, bilateral knee, low back pain. Has noticed some freezing of gait. Has appt with psychology later today.  UPDATE 08/26/13:  Since last visit, noticing some wearing off in early evening. Takes carb/levo 6:30am, noon, 9pm. Also getting more "worked up" lately, reading about PD online. No falls. Uses cane and walker. More fatigue and mental exhaustion.  UPDATE 02/04/13:  Since last visit, has had some minor falls. tremor and PD sxs better on carb/levo. No anxiety. Some drooling and hypophonia. Not using walker or cane all the time. constipation stable.  UPDATE 10/06/12:  Since last visit, had another back surgery (april 2013), dx'd with CHF, dx'd with sjogrens, also with fall in August 2013 with pelvic fracture. Reports good tremor control. Some freezing of gait.  UPDATE 02/13/12:  Now on pramipexole 1mg  QID (7am, noon, 5pm, 10pm). Some wearing off around 5pm. Also, had rxn to forteo, now with right  leg pain. Also getting lumbar radiculopathy eval per ortho.  UPDATE 08/14/11:  Tremor developing around 4pm now.  More anxiety per husband.  Also with UTI x 2.  Now dx'd with left foot fractures (? "stubbed" toes; no falls).  UPDATE 04/27/11:  Tolerating pramipexole 1mg  TID (7am, 1-2pm, 7p).  some wearing off  right at 7p.  feels unsteady, and has ongoing back problems and pain.  UPDATE 01/24/11:  Doing slightly better with pramipexole 1mg  BID.  Tremor returns later in the day before evening dose.  Still with gait and balance diff.    PRIOR HPI: 83 year old female with history of hypertension, hypercholesterolemia, hypothyroidism, fibromyalgia, restless leg syndrome, presenting for evaluation of tremor for the past 3 years. She is here with her husband. Approximately 3 years ago patient developed intermittent tremor in her left hand particularly in her thumb and fingers.  Tremor is most noticeable at rest and in the evening.  In June 2011, patient tripped, fell and broke her left wrist and forearm.  This was repaired surgically and in a cast; during this time and since then her tremor has improved. Patient also has history of restless leg syndrome for the past 5 years. She's been on pramipexole for number of years (0.5mg  qhs) and recently increased to 1 mg qhs a few weeks ago.  The patient's mother also had restless leg syndrome. Patient has a diagnosis of iron deficiency anemia but is not on iron replacement.   REVIEW OF SYSTEMS: Full 14 system review of systems performed and negative except for trouble swallowing fatigue appetite change walking diff rash constipation shortness double vision.    ALLERGIES: Allergies  Allergen Reactions  . Codeine Other (See Comments)    Reaction:  Headaches and nightmares   . Lactose Intolerance (Gi) Nausea And Vomiting  . Lyrica [Pregabalin] Swelling and Other (See Comments)    Reaction:  Leg swelling  . Other Other (See Comments)    Pt states that pain medications give her nightmares.    . Plaquenil [Hydroxychloroquine Sulfate] Other (See Comments)    Reaction:  GI upset   . Reglan [Metoclopramide] Other (See Comments)    Reaction:  GI upset   . Requip [Ropinirole Hcl] Other (See Comments)    Reaction:  GI upset   . Septra [Sulfamethoxazole-Trimethoprim] Nausea  And Vomiting  . Shellfish Allergy Nausea And Vomiting  . Latex Rash    HOME MEDICATIONS: Outpatient Medications Prior to Visit  Medication Sig Dispense Refill  . aspirin EC 81 MG tablet Take 81 mg by mouth at bedtime.     . Calcium Carb-Cholecalciferol (CALCIUM 1000 + D) 1000-800 MG-UNIT TABS Take 1 tablet by mouth daily.    . carbidopa-levodopa (SINEMET IR) 25-100 MG tablet Take 2 tablets by mouth 3 (three) times daily. 540 tablet 4  . celecoxib (CELEBREX) 200 MG capsule Take 200 mg by mouth 2 (two) times daily.    . cholecalciferol (VITAMIN D) 1000 units tablet Take 2,000 Units by mouth daily.    Marland Kitchen denosumab (PROLIA) 60 MG/ML SOLN injection Inject 60 mg into the skin every 6 (six) months. Administer in upper arm, thigh, or abdomen    . dexlansoprazole (DEXILANT) 60 MG capsule Take 60 mg by mouth daily.    . diphenhydramine-acetaminophen (TYLENOL PM) 25-500 MG TABS Take 1 tablet by mouth at bedtime.    . docusate sodium (COLACE) 100 MG capsule Take 100 mg by mouth at bedtime.    . DULoxetine (CYMBALTA) 20 MG capsule Take 20 mg by mouth  daily.    . ezetimibe (ZETIA) 10 MG tablet Take 10 mg by mouth at bedtime.     . hydroxychloroquine (PLAQUENIL) 200 MG tablet Take 200 mg by mouth daily.   0  . levothyroxine (SYNTHROID, LEVOTHROID) 50 MCG tablet Take 50 mcg by mouth daily before breakfast.     . lidocaine (XYLOCAINE) 5 % ointment Apply 2-3 application topically 4 (four) times daily. Apply to affected area/feet 35.44 g 12  . lovastatin (MEVACOR) 40 MG tablet Take 40 mg by mouth at bedtime.    . metoprolol tartrate (LOPRESSOR) 25 MG tablet Take 1 tablet (25 mg total) by mouth daily. 90 tablet 2  . Multiple Vitamin (MULTIVITAMIN WITH MINERALS) TABS tablet Take 1 tablet by mouth daily.    Vladimir Faster Glycol-Propyl Glycol (SYSTANE) 0.4-0.3 % GEL ophthalmic gel Place 1 application into both eyes at bedtime.    Vladimir Faster Glycol-Propyl Glycol (SYSTANE) 0.4-0.3 % SOLN Place 1 drop into both eyes  daily.     . pramipexole (MIRAPEX) 1 MG tablet Take 1 tablet (1 mg total) by mouth 3 (three) times daily. 270 tablet 4  . Skin Protectants, Misc. (EUCERIN) cream Apply 1 application topically as needed (for irritation).     . traMADol (ULTRAM) 50 MG tablet Take 50 mg by mouth 3 (three) times daily as needed for moderate pain.     . traMADol (ULTRAM-ER) 200 MG 24 hr tablet Take 200 mg by mouth daily as needed for pain.     Marland Kitchen trimethoprim (TRIMPEX) 100 MG tablet Take 100 mg by mouth daily.    Marland Kitchen estradiol (ESTRACE) 0.1 MG/GM vaginal cream Place 1 Applicatorful vaginally at bedtime as needed (for dryness).    Marland Kitchen albuterol (PROAIR HFA) 108 (90 Base) MCG/ACT inhaler Inhale 2 puffs into the lungs every 4 (four) hours as needed for wheezing or shortness of breath. 1 Inhaler 5  . bacitracin ointment Apply 1 application topically 2 (two) times daily. (Patient not taking: Reported on 11/15/2017) 28 g 1   No facility-administered medications prior to visit.     PAST MEDICAL HISTORY: Past Medical History:  Diagnosis Date  . Anemia   . Anxiety   . Aortic stenosis    mild by echo 05/2016  . Arthritis   . Broken arm    right   . Chronic diastolic CHF (congestive heart failure) ([place])   . Family history of adverse reaction to anesthesia    pt. states sister vomits  . Fibromyalgia   . Fracture of thumb 09/01/2015   right  . Fracture, foot 09/01/2015   right  . GERD (gastroesophageal reflux disease)   . H/O hiatal hernia   . History of bronchitis   . History of kidney stones   . Hypercholesterolemia   . Hypertension    dr t turner  . Hypothyroidism   . Neuropathy   . Osteoporosis   . Parkinson disease ([place])   . PVD (peripheral vascular disease) ([facility])    99% stenosis of left anteiror tibial artery, mod stenosis of left distal SFA and popliteal artery followed by [name]  . Restless leg   . Shortness of breath   . Sjogren's disease ([place])   . SOB (shortness of breath)    chronic due to  diastolic dysfunction, deconditioning, obesity  . Tremor     PAST SURGICAL HISTORY: Past Surgical History:  Procedure Laterality Date  . ABDOMINAL HYSTERECTOMY    . BACK SURGERY     4 back surgeries,  lumbar fusion  . CARDIAC CATHETERIZATION  2006   normal  . EYE SURGERY Bilateral    cateracts  . falls     variious fall, broken wrist,and toes  . FRACTURE SURGERY Right April 2016   Wrist, Pt. fell  . SPINAL CORD STIMULATOR INSERTION N/A 09/09/2015   Procedure: LUMBAR SPINAL CORD STIMULATOR INSERTION;  Surgeon: Clydell Hakim, MD;  Location: [facility];  Service: Neurosurgery;  Laterality: N/A;  LUMBAR SPINAL CORD STIMULATOR INSERTION  . SPINE SURGERY  April 2013   Back X's 4  . TOTAL HIP ARTHROPLASTY     right  . WRIST FRACTURE SURGERY Bilateral     FAMILY HISTORY: Family History  Problem Relation Age of Onset  . Heart attack Mother   . Restless legs syndrome Mother   . Heart failure Mother   . Heart disease Mother   . Hypertension Mother   . COPD Father     SOCIAL HISTORY:  Social History   Socioeconomic History  . Marital status: Married    Spouse name: [name]  . Number of children: 2  . Years of education: HS  . Highest education level: Not on file  Social Needs  . Financial resource strain: Not on file  . Food insecurity - worry: Not on file  . Food insecurity - inability: Not on file  . Transportation needs - medical: Not on file  . Transportation needs - non-medical: Not on file  Occupational History    Comment: Homemaker  Tobacco Use  . Smoking status: Never Smoker  . Smokeless tobacco: Never Used  Substance and Sexual Activity  . Alcohol use: No    Alcohol/week: 0.0 oz  . Drug use: No  . Sexual activity: No  Other Topics Concern  . Not on file  Social History Narrative   Patient lives at home with her spouse.   Caffeine Use: tea     PHYSICAL EXAM  Vitals:   11/15/17 1008  BP: 126/72  Pulse: 85  Weight: 162 lb 12.8 oz (73.8 kg)     Not recorded     MMSE - Mini Mental State Exam 10/13/2015  Orientation to time 5  Orientation to Place 4  Registration 3  Attention/ Calculation 4  Recall 2  Language- name 2 objects 2  Language- repeat 0  Language- follow 3 step command 3  Language- read & follow direction 1  Write a sentence 1  Copy design 0  Total score 25    Body mass index is 34.03 kg/m.  GENERAL EXAM: General: Patient is awake, alert and in no acute distress.  Well developed and groomed.  MASKED FACIES. Neck: Neck is supple. Cardiovascular: Heart is regular rate and rhythm with no murmurs.  Neurologic Exam  Mental Status: Awake, alert.  Language is fluent and comprehension intact. Cranial Nerves: Pupils are equal and reactive to light.  Visual fields are full to confrontation.  Conjugate eye movements are full and symmetric.  Facial sensation and strength are symmetric.  Hearing is intact.  Palate elevated symmetrically and uvula is midline.  Shoulder shug is symmetric.  Tongue is midline. Motor: Normal bulk and tone; NO COGWHEELING.  NO REST OR POSTURAL TREMOR.  BRADYKINESIA IN BUE AND BLE. 4+/5 strength in the upper and lower extremities; EXCEPT LIMITED IN LEFT ARM DUE TO LEFT SHOULDER PAIN Sensory: Intact and symmetric to light touch. Coordination: No ataxia or dysmetria; SLOW MOVEMENTS OVERALL on finger-nose or rapid alternating movement testing. Reflexes: Deep tendon reflexes  in the upper extremity are present and symmetric; ABSENT AT KNEES AND ANKLES. Gait and Station: CAUTIOUS GAIT. SLOW TO RISE, AND NEEDS HELP TO STAND UP.  DOING WELL ROLLATOR WALKER.    DIAGNOSTIC DATA (LABS, IMAGING, TESTING) - I reviewed patient records, labs, notes, testing and imaging myself where available.  Lab Results  Component Value Date   WBC 10.0 01/13/2017   HGB 10.0 (L) 01/13/2017   HCT 31.5 (L) 01/13/2017   MCV 91.0 01/13/2017   PLT 295 01/13/2017      Component Value Date/Time   NA 141 01/13/2017  1733   K 4.5 01/13/2017 1733   CL 111 01/13/2017 1733   CO2 25 01/13/2017 1733   GLUCOSE 109 (H) 01/13/2017 1733   BUN 20 01/13/2017 1733   CREATININE 1.10 (H) 01/13/2017 1733   CREATININE 0.87 05/03/2016 0926   CALCIUM 9.4 01/13/2017 1733   PROT 6.7 01/13/2017 1733   ALBUMIN 4.1 01/13/2017 1733   AST 29 01/13/2017 1733   ALT 16 01/13/2017 1733   ALKPHOS 55 01/13/2017 1733   BILITOT <0.1 (L) 01/13/2017 1733   GFRNONAA 45 (L) 01/13/2017 1733   GFRAA 52 (L) 01/13/2017 1733   No results found for: CHOL No results found for: HGBA1C No results found for: VITAMINB12 No results found for: TSH   11/10/10 MRI brain - mild atrophy and minimal chronic small vessel ischemic disease.  07/25/16 CT lumbar myelogram 1. Solid posterior fusion L2-3, L3-4, L4-5, and L5-S1. 2. Progressive adjacent level disease and right lateral listhesis at L1-2 with mild subarticular narrowing bilaterally, moderate right, and mild left foraminal stenosis. 3. Wide laminectomy and bilateral foraminotomies without residual or recurrent stenosis at L2-3. 4. Mild foraminal narrowing bilaterally at L3-4, left greater than right. This is stable. 5. No residual or recurrent stenosis at L4-5. 6. Bone spur extends into the left subarticular space with moderate left subarticular stenosis at L5-S1. 7. Mild residual osseous foraminal narrowing bilaterally at L5-S1. 8. Bilateral nonobstructing renal calculi.     ASSESSMENT AND PLAN  83 y.o. female with hypertension, hypercholesterolemia, hypothyroidism, fibromyalgia, restless leg syndrome and parkinson's disease.  Continues with shoulder pain, foot pain, memory loss and gait difficulty, likely related to progression of parkinson's disease.   Dx: idiopathic parkinson's disease (akinetic/rigid) + RLS + memory loss (likely related to parkinson's disease)  Parkinson's disease ([place])  Memory loss  Neuropathy due to Sjogren's syndrome ([place])  Gait difficulty     PLAN:  I spent 25 minutes of face to face time with patient. Greater than 50% of time was spent in counseling and coordination of care with patient. In summary we discussed:   PARKINSON'S DISEASE - continue pramipexole 1mg  three time per day - continue carb/levo to 2 tabs three times per day - continue using rolling walker at all times; increase supervision and assistance to avoid falls/injuries - try lidocaine topical cream for foot pain - stay active and continue stimulating activities  Meds ordered this encounter  Medications  . pramipexole (MIRAPEX) 1 MG tablet    Sig: Take 1 tablet (1 mg total) by mouth 3 (three) times daily.    Dispense:  270 tablet    Refill:  4  . carbidopa-levodopa (SINEMET IR) 25-100 MG tablet    Sig: Take 2 tablets by mouth 3 (three) times daily.    Dispense:  540 tablet    Refill:  4   Return in about 9 months (around 08/16/2018).    [name], MD 11/15/2017, 10:18  AM Certified in Neurology, Neurophysiology and [facility] [address] [place], [place] [address] [phone]

## 2017-11-25 DIAGNOSIS — Z471 Aftercare following joint replacement surgery: Secondary | ICD-10-CM | POA: Diagnosis not present

## 2017-11-25 DIAGNOSIS — M7061 Trochanteric bursitis, right hip: Secondary | ICD-10-CM | POA: Diagnosis not present

## 2017-11-25 DIAGNOSIS — Z96641 Presence of right artificial hip joint: Secondary | ICD-10-CM | POA: Diagnosis not present

## 2017-12-04 DIAGNOSIS — Z96641 Presence of right artificial hip joint: Secondary | ICD-10-CM | POA: Diagnosis not present

## 2017-12-04 DIAGNOSIS — Z471 Aftercare following joint replacement surgery: Secondary | ICD-10-CM | POA: Diagnosis not present

## 2017-12-04 DIAGNOSIS — M7061 Trochanteric bursitis, right hip: Secondary | ICD-10-CM | POA: Diagnosis not present

## 2017-12-04 DIAGNOSIS — M7918 Myalgia, other site: Secondary | ICD-10-CM | POA: Diagnosis not present

## 2017-12-11 DIAGNOSIS — L905 Scar conditions and fibrosis of skin: Secondary | ICD-10-CM | POA: Diagnosis not present

## 2017-12-11 DIAGNOSIS — L570 Actinic keratosis: Secondary | ICD-10-CM | POA: Diagnosis not present

## 2017-12-30 ENCOUNTER — Other Ambulatory Visit: Payer: Self-pay | Admitting: Neurological Surgery

## 2017-12-30 DIAGNOSIS — I10 Essential (primary) hypertension: Secondary | ICD-10-CM | POA: Diagnosis not present

## 2017-12-30 DIAGNOSIS — Z6832 Body mass index (BMI) 32.0-32.9, adult: Secondary | ICD-10-CM | POA: Diagnosis not present

## 2017-12-30 DIAGNOSIS — M545 Low back pain: Secondary | ICD-10-CM | POA: Diagnosis not present

## 2017-12-31 ENCOUNTER — Telehealth: Payer: Self-pay | Admitting: Diagnostic Neuroimaging

## 2017-12-31 NOTE — Telephone Encounter (Signed)
Patient's husband requesting a returned call. The patient is having more frequent memory problems and needs a soon appointment.

## 2017-12-31 NOTE — Telephone Encounter (Signed)
May consider exelon patch, donepezil or memantine. Could also monitor symptoms. -VRP

## 2017-12-31 NOTE — Telephone Encounter (Signed)
I called and spoke to both pt and husband.  He stated that pt has had about 3 episodes of confusion, one last week when playing bridge and the other was the last 2 days.  (not being aware that she was at home when she was at home, and of leaving something at her grandson home in which she had not been there to do so).  She was adamant about about this and become agitated when husband disagreed with her.  She is better to day. She has no other meds changes from last seen.  Last seen 11-15-17. See pt? Start med for memory?

## 2018-01-01 NOTE — Telephone Encounter (Signed)
Patients husband is returning your call. 

## 2018-01-01 NOTE — Telephone Encounter (Addendum)
I spoke to pt and husband.  Gave then considerations from Dr. Leta Baptist about the medications and just monitoring.  Husband wanted names of medications to look up and discuss with son who is Dr of Pharmacy, but also to to have appt to discuss, so made appt for 02-05-18 at 1300 (confirmed by email).

## 2018-01-01 NOTE — Telephone Encounter (Signed)
LMVM for pt and husband that was returning call.

## 2018-01-06 ENCOUNTER — Ambulatory Visit
Admission: RE | Admit: 2018-01-06 | Discharge: 2018-01-06 | Disposition: A | Discharge status: Home / Self Care | Payer: Medicare Other | Source: Ambulatory Visit | Attending: Neurological Surgery | Admitting: Neurological Surgery

## 2018-01-06 DIAGNOSIS — M545 Low back pain: Secondary | ICD-10-CM | POA: Diagnosis not present

## 2018-01-06 DIAGNOSIS — M5124 Other intervertebral disc displacement, thoracic region: Secondary | ICD-10-CM | POA: Diagnosis not present

## 2018-01-06 DIAGNOSIS — M5135 Other intervertebral disc degeneration, thoracolumbar region: Secondary | ICD-10-CM | POA: Diagnosis not present

## 2018-01-07 DIAGNOSIS — N390 Urinary tract infection, site not specified: Secondary | ICD-10-CM | POA: Diagnosis not present

## 2018-01-07 DIAGNOSIS — N200 Calculus of kidney: Secondary | ICD-10-CM | POA: Diagnosis not present

## 2018-01-09 ENCOUNTER — Other Ambulatory Visit: Payer: Self-pay | Admitting: Cardiology

## 2018-01-09 DIAGNOSIS — N200 Calculus of kidney: Secondary | ICD-10-CM | POA: Diagnosis not present

## 2018-01-10 ENCOUNTER — Encounter: Payer: Self-pay | Admitting: *Deleted

## 2018-01-13 DIAGNOSIS — M545 Low back pain: Secondary | ICD-10-CM | POA: Diagnosis not present

## 2018-01-13 DIAGNOSIS — Z6832 Body mass index (BMI) 32.0-32.9, adult: Secondary | ICD-10-CM | POA: Diagnosis not present

## 2018-01-13 DIAGNOSIS — I10 Essential (primary) hypertension: Secondary | ICD-10-CM | POA: Diagnosis not present

## 2018-01-21 ENCOUNTER — Ambulatory Visit: Payer: Medicare Other | Admitting: Internal Medicine

## 2018-01-23 ENCOUNTER — Telehealth: Payer: Self-pay | Admitting: *Deleted

## 2018-01-23 DIAGNOSIS — Z961 Presence of intraocular lens: Secondary | ICD-10-CM | POA: Diagnosis not present

## 2018-01-23 DIAGNOSIS — H524 Presbyopia: Secondary | ICD-10-CM | POA: Diagnosis not present

## 2018-01-23 DIAGNOSIS — H04123 Dry eye syndrome of bilateral lacrimal glands: Secondary | ICD-10-CM | POA: Diagnosis not present

## 2018-01-23 DIAGNOSIS — H35371 Puckering of macula, right eye: Secondary | ICD-10-CM | POA: Diagnosis not present

## 2018-01-23 NOTE — Telephone Encounter (Signed)
Received call from patient's husband who was asking about a sooner follow up than 02/05/18. This RN checked and advised him there is no sooner. However she was placed on wait list, and he is aware. He stated they are going out of town for two weeks, leaving mid March. He would like her to be on any new medications prior to leaving. He verbalized understanding, appreciation.

## 2018-01-24 ENCOUNTER — Encounter: Payer: Self-pay | Admitting: Cardiology

## 2018-01-24 ENCOUNTER — Ambulatory Visit (INDEPENDENT_AMBULATORY_CARE_PROVIDER_SITE_OTHER): Payer: Medicare Other | Admitting: Cardiology

## 2018-01-24 VITALS — BP 114/62 | HR 64 | Ht <= 58 in | Wt 160.0 lb

## 2018-01-24 DIAGNOSIS — I350 Nonrheumatic aortic (valve) stenosis: Secondary | ICD-10-CM | POA: Diagnosis not present

## 2018-01-24 DIAGNOSIS — I10 Essential (primary) hypertension: Secondary | ICD-10-CM

## 2018-01-24 DIAGNOSIS — E7800 Pure hypercholesterolemia, unspecified: Secondary | ICD-10-CM

## 2018-01-24 DIAGNOSIS — I739 Peripheral vascular disease, unspecified: Secondary | ICD-10-CM | POA: Diagnosis not present

## 2018-01-24 DIAGNOSIS — I4510 Unspecified right bundle-branch block: Secondary | ICD-10-CM

## 2018-01-24 DIAGNOSIS — R0602 Shortness of breath: Secondary | ICD-10-CM

## 2018-01-24 DIAGNOSIS — I5032 Chronic diastolic (congestive) heart failure: Secondary | ICD-10-CM | POA: Diagnosis not present

## 2018-01-24 LAB — BASIC METABOLIC PANEL
BUN/Creatinine Ratio: 22 (ref 12–28)
BUN: 20 mg/dL (ref 8–27)
CALCIUM: 9.7 mg/dL (ref 8.7–10.3)
CO2: 20 mmol/L (ref 20–29)
Chloride: 105 mmol/L (ref 96–106)
Creatinine, Ser: 0.89 mg/dL (ref 0.57–1.00)
GFR calc Af Amer: 69 mL/min/{1.73_m2} (ref >59)
GFR, EST NON AFRICAN AMERICAN: 60 mL/min/{1.73_m2} (ref >59)
Glucose: 96 mg/dL (ref 65–99)
Potassium: 4.6 mmol/L (ref 3.5–5.2)
Sodium: 139 mmol/L (ref 134–144)

## 2018-01-24 LAB — PRO B NATRIURETIC PEPTIDE: NT-Pro BNP: 136 pg/mL (ref 0–738)

## 2018-01-24 NOTE — Patient Instructions (Signed)
Medication Instructions:  Your physician recommends that you continue on your current medications as directed. Please refer to the Current Medication list given to you today.  Labwork: Today for kidney function and BNP  Your physician recommends that you return for lab work in: 1 week for liver function test and fasting lipids.    Testing/Procedures: Your physician has requested that you have an echocardiogram. Echocardiography is a painless test that uses sound waves to create images of your heart. It provides your doctor with information about the size and shape of your heart and how well your heart's chambers and valves are working. This procedure takes approximately one hour. There are no restrictions for this procedure.  Your physician has requested that you have a lexiscan myoview. For further information please visit [email or web address]. Please follow instruction sheet, as given.    Follow-Up: Your physician wants you to follow-up in: 6 months with PA. You will receive a reminder letter in the mail two months in advance. If you don't receive a letter, please call our office to schedule the follow-up appointment.  Your physician wants you to follow-up in: 1 year with Dr. Radford Pax. You will receive a reminder letter in the mail two months in advance. If you don't receive a letter, please call our office to schedule the follow-up appointment.   Any Other Special Instructions Will Be Listed Below (If Applicable).  Thank you for choosing [facility], RN  [phone]   If you need a refill on your cardiac medications before your next appointment, please call your pharmacy.

## 2018-01-24 NOTE — Progress Notes (Signed)
Cardiology Office Note:    Date:  01/24/2018   ID:  [name], DOB December 05, 1931, MRN [id number]  PCP:  Carol Ada, MD  Cardiologist:  No primary care provider on file.    Referring MD: Carol Ada, MD   Chief Complaint  Patient presents with  . Congestive Heart Failure  . Aortic Stenosis  . Hypertension    History of Present Illness:    [name] is a 84 y.o. female with a hx of chronic SOB secondary to diastolic dysfunction, chronic diastolic CHF, deconditioning and obesity. A heart cath in 2006 showed normal coronary arteries and normal LVF with normal LVEDP.She also has a history of HTN, mild aortic stenosis on echo 05/2016, chronic LE edema and PVD followed by [name].  She is here today for followup and is doing well from a cardiac standpoint but her Parkinson's disease has progressed and is having problems with memory.  She denies any chest pain or pressure,  PND, orthopnea, LE edema, dizziness, palpitations or syncope. She has chronic DOE that is stable.  She is compliant with her meds and is tolerating meds with no SE.    Past Medical History:  Diagnosis Date  . Anemia   . Anxiety   . Aortic stenosis    mild by echo 05/2016  . Arthritis   . Broken arm    right   . Chronic diastolic CHF (congestive heart failure) ([place])   . Family history of adverse reaction to anesthesia    pt. states sister vomits  . Fibromyalgia   . Fracture of thumb 09/01/2015   right  . Fracture, foot 09/01/2015   right  . GERD (gastroesophageal reflux disease)   . H/O hiatal hernia   . History of bronchitis   . History of kidney stones   . Hypercholesterolemia   . Hypertension    dr t [place] Riggle  . Hypothyroidism   . Neuropathy   . Osteoporosis   . Parkinson disease ([place])   . PVD (peripheral vascular disease) (H[facility])    99% stenosis of left anteiror tibial artery, mod stenosis of left distal SFA and popliteal artery followed by [name]  . RBBB    noted on EKG 2018    . Restless leg   . Shortness of breath   . Sjogren's disease ([place])   . SOB (shortness of breath)    chronic due to diastolic dysfunction, deconditioning, obesity  . Tremor     Past Surgical History:  Procedure Laterality Date  . ABDOMINAL HYSTERECTOMY    . BACK SURGERY     4 back surgeries,   lumbar fusion  . CARDIAC CATHETERIZATION  2006   normal  . EYE SURGERY Bilateral    cateracts  . falls     variious fall, broken wrist,and toes  . FRACTURE SURGERY Right April 2016   Wrist, Pt. fell  . SPINAL CORD STIMULATOR INSERTION N/A 09/09/2015   Procedure: LUMBAR SPINAL CORD STIMULATOR INSERTION;  Surgeon: Clydell Hakim, MD;  Location: [facility];  Service: Neurosurgery;  Laterality: N/A;  LUMBAR SPINAL CORD STIMULATOR INSERTION  . SPINE SURGERY  April 2013   Back X's 4  . TOTAL HIP ARTHROPLASTY     right  . WRIST FRACTURE SURGERY Bilateral     Current Medications: Current Meds  Medication Sig  . aspirin EC 81 MG tablet Take 81 mg by mouth at bedtime.   . Calcium Carb-Cholecalciferol (CALCIUM 1000 + D) 1000-800 MG-UNIT TABS Take 1  tablet by mouth daily.  . carbidopa-levodopa (SINEMET IR) 25-100 MG tablet Take 2 tablets by mouth 3 (three) times daily.  . celecoxib (CELEBREX) 200 MG capsule Take 200 mg by mouth 2 (two) times daily.  . cholecalciferol (VITAMIN D) 1000 units tablet Take 2,000 Units by mouth daily.  Marland Kitchen denosumab (PROLIA) 60 MG/ML SOLN injection Inject 60 mg into the skin every 6 (six) months. Administer in upper arm, thigh, or abdomen  . dexlansoprazole (DEXILANT) 60 MG capsule Take 60 mg by mouth daily.  . diphenhydramine-acetaminophen (TYLENOL PM) 25-500 MG TABS Take 1 tablet by mouth at bedtime.  . docusate sodium (COLACE) 100 MG capsule Take 100 mg by mouth at bedtime.  . DULoxetine (CYMBALTA) 20 MG capsule Take 20 mg by mouth daily.  Marland Kitchen ezetimibe (ZETIA) 10 MG tablet Take 10 mg by mouth at bedtime.   . hydroxychloroquine (PLAQUENIL) 200 MG tablet Take 200 mg by  mouth daily.   Marland Kitchen levothyroxine (SYNTHROID, LEVOTHROID) 50 MCG tablet Take 50 mcg by mouth daily before breakfast.   . lidocaine (XYLOCAINE) 5 % ointment Apply 2-3 application topically 4 (four) times daily. Apply to affected area/feet  . lovastatin (MEVACOR) 40 MG tablet Take 40 mg by mouth at bedtime.  . metoprolol tartrate (LOPRESSOR) 25 MG tablet TAKE 1 TABLET ONCE DAILY.  . Multiple Vitamin (MULTIVITAMIN WITH MINERALS) TABS tablet Take 1 tablet by mouth daily.  Vladimir Faster Glycol-Propyl Glycol (SYSTANE) 0.4-0.3 % GEL ophthalmic gel Place 1 application into both eyes at bedtime.  Vladimir Faster Glycol-Propyl Glycol (SYSTANE) 0.4-0.3 % SOLN Place 1 drop into both eyes daily.   . pramipexole (MIRAPEX) 1 MG tablet Take 1 tablet (1 mg total) by mouth 3 (three) times daily.  . Skin Protectants, Misc. (EUCERIN) cream Apply 1 application topically as needed (for irritation).   . traMADol (ULTRAM) 50 MG tablet Take 50 mg by mouth 3 (three) times daily as needed for moderate pain.   . traMADol (ULTRAM-ER) 200 MG 24 hr tablet Take 200 mg by mouth daily as needed for pain.   Marland Kitchen trimethoprim (TRIMPEX) 100 MG tablet Take 100 mg by mouth daily.     Allergies:   Codeine; Lactose intolerance (gi); Lyrica [pregabalin]; Other; Plaquenil [hydroxychloroquine sulfate]; Reglan [metoclopramide]; Requip [ropinirole hcl]; Septra [sulfamethoxazole-trimethoprim]; Shellfish allergy; and Latex   Social History   Socioeconomic History  . Marital status: Married    Spouse name: [name]  . Number of children: 2  . Years of education: HS  . Highest education level: None  Social Needs  . Financial resource strain: None  . Food insecurity - worry: None  . Food insecurity - inability: None  . Transportation needs - medical: None  . Transportation needs - non-medical: None  Occupational History    Comment: Homemaker  Tobacco Use  . Smoking status: Never Smoker  . Smokeless tobacco: Never Used  Substance and  Sexual Activity  . Alcohol use: No    Alcohol/week: 0.0 oz  . Drug use: No  . Sexual activity: No  Other Topics Concern  . None  Social History Narrative   Patient lives at home with her spouse. Married in 1957. Two people lives in the home, no pets.    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    Caffeine Use: tea     Family History: The patient's family history includes COPD in her father; Heart attack in her mother; Heart disease in her mother;  Heart failure in her mother; Hypertension in her mother; Restless legs syndrome in her mother.  ROS:   Please see the history of present illness.    ROS  All other systems reviewed and negative.   EKGs/Labs/Other Studies Reviewed:    The following studies were reviewed today: none  EKG:  EKG is  ordered today.  The ekg ordered today demonstrates NSR at 64bpm with RBBB  Recent Labs: No results found for requested labs within last 8760 hours.   Recent Lipid Panel No results found for: CHOL, TRIG, HDL, CHOLHDL, VLDL, LDLCALC, LDLDIRECT  Physical Exam:    VS:  BP 114/62   Pulse 64   Ht 4\' 10"  (1.473 m)   Wt 160 lb (72.6 kg)   BMI 33.44 kg/m     Wt Readings from Last 3 Encounters:  01/24/18 160 lb (72.6 kg)  11/15/17 162 lb 12.8 oz (73.8 kg)  07/23/17 166 lb 9.6 oz (75.6 kg)     GEN:  Well nourished, well developed in no acute distress HEENT: Normal NECK: No JVD; No carotid bruits LYMPHATICS: No lymphadenopathy CARDIAC: RRR, no rubs, gallops.  2/6 mid peaking systolic murmur at RUSB to LLSB RESPIRATORY:  Clear to auscultation without rales, wheezing or rhonchi  ABDOMEN: Soft, non-tender, non-distended MUSCULOSKELETAL:  No edema; No deformity  SKIN: Warm and dry NEUROLOGIC:  Alert and oriented x 3 PSYCHIATRIC:  Normal affect   ASSESSMENT:    1. Nonrheumatic aortic valve stenosis   2. Chronic diastolic CHF (congestive heart failure) ([place])   3. PVD (peripheral vascular  disease) ([place])   4. Essential hypertension   5. Hypercholesterolemia   6. RBBB    PLAN:    In order of problems listed above:  1.  Mild aortic stenosis - mild AS by echo 05/2016 with mean AVG 11cm H2O.  Given her ongoing SOB I will repeat the echo.    2.  Chronic diastolic CHF - she appears euvolemic on exam today.  Her weight is stable.  She has not required any diuretics recently.    3.  PVD - 99% stenosis of left anteiror tibial artery, mod stenosis of left distal SFA and popliteal artery followed by [name].  Continue statin.    4.  HTN - her BP is well controlled on exam today.  She will continue on metoprolol 25mg  daily  5.  Hyperlipidemia with LDL goal < 70 given PVD.  Continue Lovastatin 40mg  daily and Zetia 10mg  daily.  Her LDL was 92 last fall.  I will recheck FLP and ALT.   6.  RBBB - this appears new in the past year.  She continues to have chronic DOE which is difficult to tell if related to her underlying Parkinson's or cardiac related.  I think we should get Lexiscan myoview to rule out ishcemia since she has known vascular disease.   Medication Adjustments/Labs and Tests Ordered: Current medicines are reviewed at length with the patient today.  Concerns regarding medicines are outlined above.  Orders Placed This Encounter  Procedures  . EKG 12-Lead   No orders of the defined types were placed in this encounter.   Signed, Fransico Him, MD  01/24/2018 10:18 AM    [place]

## 2018-01-28 ENCOUNTER — Telehealth (HOSPITAL_COMMUNITY): Payer: Self-pay | Admitting: *Deleted

## 2018-01-28 NOTE — Telephone Encounter (Signed)
Patient's husband per DPR given detailed instructions per Myocardial Perfusion Study Information Sheet for the test on 01/31/18 at 0475. Patient notified to arrive 15 minutes early and that it is imperative to arrive on time for appointment to keep from having the test rescheduled.  If you need to cancel or reschedule your appointment, please call the office within 24 hours of your appointment. . Patient verbalized understanding.Yunus Stoklosa, [place]e [place]

## 2018-01-31 ENCOUNTER — Telehealth: Payer: Self-pay

## 2018-01-31 ENCOUNTER — Other Ambulatory Visit: Payer: Medicare Other | Admitting: *Deleted

## 2018-01-31 ENCOUNTER — Ambulatory Visit (HOSPITAL_COMMUNITY): Payer: Medicare Other | Attending: Cardiology

## 2018-01-31 ENCOUNTER — Encounter: Payer: Self-pay | Admitting: Cardiology

## 2018-01-31 ENCOUNTER — Other Ambulatory Visit: Payer: Self-pay

## 2018-01-31 ENCOUNTER — Ambulatory Visit (HOSPITAL_BASED_OUTPATIENT_CLINIC_OR_DEPARTMENT_OTHER): Payer: Medicare Other

## 2018-01-31 DIAGNOSIS — M3500 Sicca syndrome, unspecified: Secondary | ICD-10-CM | POA: Diagnosis not present

## 2018-01-31 DIAGNOSIS — I350 Nonrheumatic aortic (valve) stenosis: Secondary | ICD-10-CM

## 2018-01-31 DIAGNOSIS — R0602 Shortness of breath: Secondary | ICD-10-CM

## 2018-01-31 DIAGNOSIS — I509 Heart failure, unspecified: Secondary | ICD-10-CM | POA: Diagnosis not present

## 2018-01-31 DIAGNOSIS — E7800 Pure hypercholesterolemia, unspecified: Secondary | ICD-10-CM

## 2018-01-31 DIAGNOSIS — Z6833 Body mass index (BMI) 33.0-33.9, adult: Secondary | ICD-10-CM | POA: Insufficient documentation

## 2018-01-31 DIAGNOSIS — I110 Hypertensive heart disease with heart failure: Secondary | ICD-10-CM | POA: Insufficient documentation

## 2018-01-31 DIAGNOSIS — I5032 Chronic diastolic (congestive) heart failure: Secondary | ICD-10-CM

## 2018-01-31 DIAGNOSIS — I739 Peripheral vascular disease, unspecified: Secondary | ICD-10-CM | POA: Diagnosis not present

## 2018-01-31 DIAGNOSIS — R9439 Abnormal result of other cardiovascular function study: Secondary | ICD-10-CM | POA: Insufficient documentation

## 2018-01-31 DIAGNOSIS — I4510 Unspecified right bundle-branch block: Secondary | ICD-10-CM | POA: Insufficient documentation

## 2018-01-31 DIAGNOSIS — E669 Obesity, unspecified: Secondary | ICD-10-CM | POA: Diagnosis not present

## 2018-01-31 LAB — MYOCARDIAL PERFUSION IMAGING
CHL CUP NUCLEAR SRS: 4
CHL CUP RESTING HR STRESS: 65 {beats}/min
CSEPPHR: 81 {beats}/min
LV dias vol: 65 mL (ref 46–106)
LV sys vol: 21 mL
RATE: 0.33
SDS: 5
SSS: 9
TID: 1.1

## 2018-01-31 LAB — HEPATIC FUNCTION PANEL
ALT: 7 IU/L (ref 0–32)
AST: 32 IU/L (ref 0–40)
Albumin: 4.2 g/dL (ref 3.5–4.7)
Alkaline Phosphatase: 56 IU/L (ref 39–117)
BILIRUBIN TOTAL: 0.4 mg/dL (ref 0.0–1.2)
Bilirubin, Direct: 0.09 mg/dL (ref 0.00–0.40)
Total Protein: 6.1 g/dL (ref 6.0–8.5)

## 2018-01-31 LAB — LIPID PANEL
CHOLESTEROL TOTAL: 155 mg/dL (ref 100–199)
Chol/HDL Ratio: 2.5 ratio (ref 0.0–4.4)
HDL: 61 mg/dL (ref >39)
LDL Calculated: 55 mg/dL (ref 0–99)
Triglycerides: 196 mg/dL — ABNORMAL HIGH (ref 0–149)
VLDL Cholesterol Cal: 39 mg/dL (ref 5–40)

## 2018-01-31 MED ORDER — TECHNETIUM TC 99M TETROFOSMIN IV KIT
31.5000 | PACK | Freq: Once | INTRAVENOUS | Status: AC | PRN
  Administered 2018-01-31: 31.5 via INTRAVENOUS
  Filled 2018-01-31: qty 32

## 2018-01-31 MED ORDER — TECHNETIUM TC 99M TETROFOSMIN IV KIT
10.2000 | PACK | Freq: Once | INTRAVENOUS | Status: AC | PRN
  Administered 2018-01-31: 10.2 via INTRAVENOUS
  Filled 2018-01-31: qty 11

## 2018-01-31 MED ORDER — REGADENOSON 0.4 MG/5ML IV SOLN
0.4000 mg | Freq: Once | INTRAVENOUS | Status: AC
  Administered 2018-01-31: 0.4 mg via INTRAVENOUS

## 2018-01-31 MED ORDER — PERFLUTREN LIPID MICROSPHERE
1.0000 mL | INTRAVENOUS | Status: AC | PRN
  Administered 2018-01-31: 3 mL via INTRAVENOUS

## 2018-01-31 NOTE — Telephone Encounter (Signed)
Notes recorded by Teressa Senter, RN on 01/31/2018 at 5:00 PM EST Patient made aware of echo results. Patient verbalized understanding and thankful for the the call. Echo ordered to be scheduled in a year.    Notes recorded by [name], MD on 01/31/2018 at 12:58 PM EST Echo showed normal LVF with EF 60-65% with moderate AS - repeat echo in 1 year. She has had chronic SOB for years and I do not think this is related to her AS. Given her significant Parkinsons with dementia will continue to follow

## 2018-02-03 ENCOUNTER — Ambulatory Visit (INDEPENDENT_AMBULATORY_CARE_PROVIDER_SITE_OTHER): Payer: Medicare Other | Admitting: Cardiology

## 2018-02-03 ENCOUNTER — Encounter: Payer: Self-pay | Admitting: Cardiology

## 2018-02-03 VITALS — BP 138/60 | HR 67 | Ht <= 58 in | Wt 161.0 lb

## 2018-02-03 DIAGNOSIS — H903 Sensorineural hearing loss, bilateral: Secondary | ICD-10-CM | POA: Diagnosis not present

## 2018-02-03 DIAGNOSIS — R0602 Shortness of breath: Secondary | ICD-10-CM | POA: Diagnosis not present

## 2018-02-03 DIAGNOSIS — R9439 Abnormal result of other cardiovascular function study: Secondary | ICD-10-CM

## 2018-02-03 DIAGNOSIS — I350 Nonrheumatic aortic (valve) stenosis: Secondary | ICD-10-CM | POA: Diagnosis not present

## 2018-02-03 NOTE — H&P (View-Only) (Signed)
02/03/2018 [facility] MAZZ[facility]ERRO   Apr 28, 1931  [id number]  Primary Physician Carol Ada, MD Primary Cardiologist: Dr. Radford Pax   Reason for Visit/CC: Abnormal NST  HPI:  84 y.o. female with a hx of chronic SOB secondary to diastolic dysfunction, chronic diastolic CHF, deconditioning and obesity.Aheart cath in 2006 showed normal coronary arteries and normal LVF with normal LVEDP.She also has a history of HTN,aortic stenosis,chronic LE edema and PVD followed by [name]. Pt also has parkinson's disease.   She was recently seen by Dr. Radford Pax for her yearly f/u on 01/24/18 and noted worsening SOB but was noted to appear euvolemic on exam. Dr. Radford Pax ordered for her to get a repeat 2D echo to reassess the status of her aortic valve. Echo performed 01/31/18 showed normal LVEF and moderate AS. She was also evluated by nuclear stress test which showed a large fixed defect in the septum and apex and large partially reversible lateral defect. Subsequently, Dr. Radford Pax has recommended [facility].   Pt is here today with her husband. She is CP free but continues to have exertional dyspnea. No resting dyspnea. Her main concern is a trip she has planned to go to [place] in 2 weeks for her sister's 80th birthday. She hopes that she can still go as planned.    Cardiac Studies/ Procedures  NST 01/31/18 Study Highlights     Nuclear stress EF: 67%.  There was no ST segment deviation noted during stress.  Defect 1: There is a large defect of severe severity present in the basal anteroseptal, mid anteroseptal and apex location.  Defect 2: There is a large defect of severe severity present in the basal inferolateral and mid inferolateral location.   Technically difficult study; abnormal stress nuclear study with large fixed defect in the septum and apex and large partially reversible lateral defect; EF 67 with normal wall motion; findings suggest prior infarct vs significant soft tissue attenuation; there  is ischemia in the lateral wall.     2D Echo  01/31/18 Study Conclusions  - Left ventricle: The cavity size was normal. Systolic function was   normal. The estimated ejection fraction was in the range of 60%   to 65%. Wall motion was normal; there were no regional wall   motion abnormalities. Doppler parameters are consistent with   abnormal left ventricular relaxation (grade 1 diastolic   dysfunction). Doppler parameters are consistent with high   ventricular filling pressure. - Aortic valve: Valve mobility was restricted. There was moderate   stenosis. There was no regurgitation. Peak velocity (S): 310   cm/s. Mean gradient (S): 24 mm Hg. Peak gradient (S): 38 mm Hg.   Valve area (VTI): 1.03 cm^2. Valve area (Vmax): 1.12 cm^2. Valve   area (Vmean): 1.1 cm^2. - Mitral valve: Transvalvular velocity was within the normal range.   There was no evidence for stenosis. There was trivial   regurgitation. - Right ventricle: The cavity size was normal. Wall thickness was   normal. Systolic function was normal. - Tricuspid valve: There was no regurgitation.  Impressions:  - Aortic stenosis severity has increased from mil to moderate since   05/2016.    Current Meds  Medication Sig  . aspirin EC 81 MG tablet Take 81 mg by mouth at bedtime.   . Calcium Carb-Cholecalciferol (CALCIUM 1000 + D) 1000-800 MG-UNIT TABS Take 1 tablet by mouth daily.  . carbidopa-levodopa (SINEMET IR) 25-100 MG tablet Take 2 tablets by mouth 3 (three) times daily.  . celecoxib (CELEBREX) 200  MG capsule Take 200 mg by mouth 2 (two) times daily.  . cholecalciferol (VITAMIN D) 1000 units tablet Take 2,000 Units by mouth daily.  Marland Kitchen denosumab (PROLIA) 60 MG/ML SOLN injection Inject 60 mg into the skin every 6 (six) months. Administer in upper arm, thigh, or abdomen  . dexlansoprazole (DEXILANT) 60 MG capsule Take 60 mg by mouth daily.  . diphenhydramine-acetaminophen (TYLENOL PM) 25-500 MG TABS Take 1 tablet by  mouth at bedtime.  . docusate sodium (COLACE) 100 MG capsule Take 100 mg by mouth at bedtime.  . DULoxetine (CYMBALTA) 20 MG capsule Take 20 mg by mouth daily.  Marland Kitchen ezetimibe (ZETIA) 10 MG tablet Take 10 mg by mouth at bedtime.   . hydroxychloroquine (PLAQUENIL) 200 MG tablet Take 200 mg by mouth daily.   Marland Kitchen levothyroxine (SYNTHROID, LEVOTHROID) 50 MCG tablet Take 50 mcg by mouth daily before breakfast.   . lidocaine (XYLOCAINE) 5 % ointment Apply 2-3 application topically 4 (four) times daily. Apply to affected area/feet  . lovastatin (MEVACOR) 40 MG tablet Take 40 mg by mouth at bedtime.  . metoprolol tartrate (LOPRESSOR) 25 MG tablet TAKE 1 TABLET ONCE DAILY.  . Multiple Vitamin (MULTIVITAMIN WITH MINERALS) TABS tablet Take 1 tablet by mouth daily.  Vladimir Faster Glycol-Propyl Glycol (SYSTANE) 0.4-0.3 % GEL ophthalmic gel Place 1 application into both eyes at bedtime.  Vladimir Faster Glycol-Propyl Glycol (SYSTANE) 0.4-0.3 % SOLN Place 1 drop into both eyes daily.   . pramipexole (MIRAPEX) 1 MG tablet Take 1 tablet (1 mg total) by mouth 3 (three) times daily.  . Skin Protectants, Misc. (EUCERIN) cream Apply 1 application topically as needed (for irritation).   . traMADol (ULTRAM) 50 MG tablet Take 50 mg by mouth 3 (three) times daily as needed for moderate pain.   . traMADol (ULTRAM-ER) 200 MG 24 hr tablet Take 200 mg by mouth daily as needed for pain.   Marland Kitchen trimethoprim (TRIMPEX) 100 MG tablet Take 100 mg by mouth daily.   Allergies  Allergen Reactions  . Codeine Other (See Comments)    Reaction:  Headaches and nightmares   . Lactose Intolerance (Gi) Nausea And Vomiting  . Latex Rash  . Lyrica [Pregabalin] Swelling and Other (See Comments)    Reaction:  Leg swelling  . Other Other (See Comments)    Pt states that pain medications give her nightmares.    . Plaquenil [Hydroxychloroquine Sulfate] Other (See Comments)    Reaction:  GI upset   . Reglan [Metoclopramide] Other (See Comments)     Reaction:  GI upset   . Requip [Ropinirole Hcl] Other (See Comments)    Reaction:  GI upset   . Septra [Sulfamethoxazole-Trimethoprim] Nausea And Vomiting  . Shellfish Allergy Nausea And Vomiting   Past Medical History:  Diagnosis Date  . Anemia   . Anxiety   . Aortic stenosis    moderate by echo 2019  . Arthritis   . Broken arm    right   . Chronic diastolic CHF (congestive heart failure) ([place])   . Family history of adverse reaction to anesthesia    pt. states sister vomits  . Fibromyalgia   . Fracture of thumb 09/01/2015   right  . Fracture, foot 09/01/2015   right  . GERD (gastroesophageal reflux disease)   . H/O hiatal hernia   . History of bronchitis   . History of kidney stones   . Hypercholesterolemia   . Hypertension    dr t turner  . Hypothyroidism   .  Neuropathy   . Osteoporosis   . Parkinson disease ([place])   . PVD (peripheral vascular disease) ([facility])    99% stenosis of left anteiror tibial artery, mod stenosis of left distal SFA and popliteal artery followed by [name]  . RBBB    noted on EKG 2018  . Restless leg   . Shortness of breath   . Sjogren's disease ([place])   . SOB (shortness of breath)    chronic due to diastolic dysfunction, deconditioning, obesity  . Tremor    Family History  Problem Relation Age of Onset  . Heart attack Mother   . Restless legs syndrome Mother   . Heart failure Mother   . Heart disease Mother   . Hypertension Mother   . COPD Father    Past Surgical History:  Procedure Laterality Date  . ABDOMINAL HYSTERECTOMY    . BACK SURGERY     4 back surgeries,   lumbar fusion  . CARDIAC CATHETERIZATION  2006   normal  . EYE SURGERY Bilateral    cateracts  . falls     variious fall, broken wrist,and toes  . FRACTURE SURGERY Right April 2016   Wrist, Pt. fell  . SPINAL CORD STIMULATOR INSERTION N/A 09/09/2015   Procedure: LUMBAR SPINAL CORD STIMULATOR INSERTION;  Surgeon: Clydell Hakim, MD;  Location: [facility] [facility];  Service:  Neurosurgery;  Laterality: N/A;  LUMBAR SPINAL CORD STIMULATOR INSERTION  . SPINE SURGERY  April 2013   Back X's 4  . TOTAL HIP ARTHROPLASTY     right  . WRIST FRACTURE SURGERY Bilateral    Social History   Socioeconomic History  . Marital status: Married    Spouse name: [name]  . Number of children: 2  . Years of education: HS  . Highest education level: Not on file  Social Needs  . Financial resource strain: Not on file  . Food insecurity - worry: Not on file  . Food insecurity - inability: Not on file  . Transportation needs - medical: Not on file  . Transportation needs - non-medical: Not on file  Occupational History    Comment: Homemaker  Tobacco Use  . Smoking status: Never Smoker  . Smokeless tobacco: Never Used  Substance and Sexual Activity  . Alcohol use: No    Alcohol/week: 0.0 oz  . Drug use: No  . Sexual activity: No  Other Topics Concern  . Not on file  Social History Narrative   Patient lives at home with her spouse. Married in 1957. Two people lives in the home, no pets.    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    Caffeine Use: tea     Review of Systems: General: negative for chills, fever, night sweats or weight changes.  Cardiovascular: negative for chest pain, dyspnea on exertion, edema, orthopnea, palpitations, paroxysmal nocturnal dyspnea or shortness of breath Dermatological: negative for rash Respiratory: negative for cough or wheezing Urologic: negative for hematuria Abdominal: negative for nausea, vomiting, diarrhea, bright red blood per rectum, melena, or hematemesis Neurologic: negative for visual changes, syncope, or dizziness All other systems reviewed and are otherwise negative except as noted above.   Physical Exam:  Blood pressure 138/60, pulse 67, height 4\' 10"  (1.473 m), weight 161 lb (73 kg), SpO2 99 %.  General appearance: alert, cooperative and no distress Neck: no  carotid bruit and no JVD Lungs: clear to auscultation bilaterally Heart: regular rate and  rhythm and 2/6 murmur loudest at RUSB Extremities: extremities normal, atraumatic, no cyanosis or edema Pulses: 2+ and symmetric Skin: Skin color, texture, turgor normal. No rashes or lesions Neurologic: Grossly normal  EKG NSR low voltage QRS -- personally reviewed   ASSESSMENT AND PLAN:   1. Exertional Dyspnea/ Abnormal NST: we discussed indication for [facility], as recommended by Dr. Radford Pax. I've discussed pt case with Dr. Radford Pax today. She would be ok with pt going to [place] post stent placement if stable. We will plan to do cath this week. I have reviewed the risks, indications, and alternatives to cardiac catheterization and possible angioplasty/stenting with the patient. Risks include but are not limited to bleeding, infection, vascular injury, stroke, myocardial infection, arrhythmia, kidney injury, radiation-related injury in the case of prolonged fluoroscopy use, emergency cardiac surgery, and death. The patient understands the risks of serious complication is low (<3%). We will get pre-cath labs today.   2. Aortic Stenosis: Moderate by recent echo. R/LHC as planned to assess for CAD given abnormal NST. Continue to follow.   3. Chronic Diastolic CHF: appears euvolemic on exam. Will get RHC this week to measure heart pressures.   Follow-Up post Cath.   Mario Voong Ladoris Gene, MHS [facility] 02/03/2018 11:56 AM

## 2018-02-03 NOTE — Progress Notes (Signed)
02/03/2018 [name]   Jan 15, 1931  [id number]  Primary Physician Carol Ada, MD Primary Cardiologist: Dr. Radford Pax   Reason for Visit/CC: Abnormal NST  HPI:  84 y.o. female with a hx of chronic SOB secondary to diastolic dysfunction, chronic diastolic CHF, deconditioning and obesity.Aheart cath in 2006 showed normal coronary arteries and normal LVF with normal LVEDP.She also has a history of HTN,aortic stenosis,chronic LE edema and PVD followed by [name]. Pt also has parkinson's disease.   She was recently seen by Dr. Radford Pax for her yearly f/u on 01/24/18 and noted worsening SOB but was noted to appear euvolemic on exam. Dr. Radford Pax ordered for her to get a repeat 2D echo to reassess the status of her aortic valve. Echo performed 01/31/18 showed normal LVEF and moderate AS. She was also evluated by nuclear stress test which showed a large fixed defect in the septum and apex and large partially reversible lateral defect. Subsequently, Dr. Radford Pax has recommended [facility].   Pt is here today with her husband. She is CP free but continues to have exertional dyspnea. No resting dyspnea. Her main concern is a trip she has planned to go to [place] in 2 weeks for her sister's 80th birthday. She hopes that she can still go as planned.    Cardiac Studies/ Procedures  NST 01/31/18 Study Highlights     Nuclear stress EF: 67%.  There was no ST segment deviation noted during stress.  Defect 1: There is a large defect of severe severity present in the basal anteroseptal, mid anteroseptal and apex location.  Defect 2: There is a large defect of severe severity present in the basal inferolateral and mid inferolateral location.   Technically difficult study; abnormal stress nuclear study with large fixed defect in the septum and apex and large partially reversible lateral defect; EF 67 with normal wall motion; findings suggest prior infarct vs significant soft tissue attenuation; there  is ischemia in the lateral wall.     2D Echo  01/31/18 Study Conclusions  - Left ventricle: The cavity size was normal. Systolic function was   normal. The estimated ejection fraction was in the range of 60%   to 65%. Wall motion was normal; there were no regional wall   motion abnormalities. Doppler parameters are consistent with   abnormal left ventricular relaxation (grade 1 diastolic   dysfunction). Doppler parameters are consistent with high   ventricular filling pressure. - Aortic valve: Valve mobility was restricted. There was moderate   stenosis. There was no regurgitation. Peak velocity (S): 310   cm/s. Mean gradient (S): 24 mm Hg. Peak gradient (S): 38 mm Hg.   Valve area (VTI): 1.03 cm^2. Valve area (Vmax): 1.12 cm^2. Valve   area (Vmean): 1.1 cm^2. - Mitral valve: Transvalvular velocity was within the normal range.   There was no evidence for stenosis. There was trivial   regurgitation. - Right ventricle: The cavity size was normal. Wall thickness was   normal. Systolic function was normal. - Tricuspid valve: There was no regurgitation.  Impressions:  - Aortic stenosis severity has increased from mil to moderate since   05/2016.    Current Meds  Medication Sig  . aspirin EC 81 MG tablet Take 81 mg by mouth at bedtime.   . Calcium Carb-Cholecalciferol (CALCIUM 1000 + D) 1000-800 MG-UNIT TABS Take 1 tablet by mouth daily.  . carbidopa-levodopa (SINEMET IR) 25-100 MG tablet Take 2 tablets by mouth 3 (three) times daily.  . celecoxib (CELEBREX) 200  MG capsule Take 200 mg by mouth 2 (two) times daily.  . cholecalciferol (VITAMIN D) 1000 units tablet Take 2,000 Units by mouth daily.  Marland Kitchen denosumab (PROLIA) 60 MG/ML SOLN injection Inject 60 mg into the skin every 6 (six) months. Administer in upper arm, thigh, or abdomen  . dexlansoprazole (DEXILANT) 60 MG capsule Take 60 mg by mouth daily.  . diphenhydramine-acetaminophen (TYLENOL PM) 25-500 MG TABS Take 1 tablet by  mouth at bedtime.  . docusate sodium (COLACE) 100 MG capsule Take 100 mg by mouth at bedtime.  . DULoxetine (CYMBALTA) 20 MG capsule Take 20 mg by mouth daily.  Marland Kitchen ezetimibe (ZETIA) 10 MG tablet Take 10 mg by mouth at bedtime.   . hydroxychloroquine (PLAQUENIL) 200 MG tablet Take 200 mg by mouth daily.   Marland Kitchen levothyroxine (SYNTHROID, LEVOTHROID) 50 MCG tablet Take 50 mcg by mouth daily before breakfast.   . lidocaine (XYLOCAINE) 5 % ointment Apply 2-3 application topically 4 (four) times daily. Apply to affected area/feet  . lovastatin (MEVACOR) 40 MG tablet Take 40 mg by mouth at bedtime.  . metoprolol tartrate (LOPRESSOR) 25 MG tablet TAKE 1 TABLET ONCE DAILY.  . Multiple Vitamin (MULTIVITAMIN WITH MINERALS) TABS tablet Take 1 tablet by mouth daily.  Vladimir Faster Glycol-Propyl Glycol (SYSTANE) 0.4-0.3 % GEL ophthalmic gel Place 1 application into both eyes at bedtime.  Vladimir Faster Glycol-Propyl Glycol (SYSTANE) 0.4-0.3 % SOLN Place 1 drop into both eyes daily.   . pramipexole (MIRAPEX) 1 MG tablet Take 1 tablet (1 mg total) by mouth 3 (three) times daily.  . Skin Protectants, Misc. (EUCERIN) cream Apply 1 application topically as needed (for irritation).   . traMADol (ULTRAM) 50 MG tablet Take 50 mg by mouth 3 (three) times daily as needed for moderate pain.   . traMADol (ULTRAM-ER) 200 MG 24 hr tablet Take 200 mg by mouth daily as needed for pain.   Marland Kitchen trimethoprim (TRIMPEX) 100 MG tablet Take 100 mg by mouth daily.   Allergies  Allergen Reactions  . Codeine Other (See Comments)    Reaction:  Headaches and nightmares   . Lactose Intolerance (Gi) Nausea And Vomiting  . Latex Rash  . Lyrica [Pregabalin] Swelling and Other (See Comments)    Reaction:  Leg swelling  . Other Other (See Comments)    Pt states that pain medications give her nightmares.    . Plaquenil [Hydroxychloroquine Sulfate] Other (See Comments)    Reaction:  GI upset   . Reglan [Metoclopramide] Other (See Comments)     Reaction:  GI upset   . Requip [Ropinirole Hcl] Other (See Comments)    Reaction:  GI upset   . Septra [Sulfamethoxazole-Trimethoprim] Nausea And Vomiting  . Shellfish Allergy Nausea And Vomiting   Past Medical History:  Diagnosis Date  . Anemia   . Anxiety   . Aortic stenosis    moderate by echo 2019  . Arthritis   . Broken arm    right   . Chronic diastolic CHF (congestive heart failure) ([place])   . Family history of adverse reaction to anesthesia    pt. states sister vomits  . Fibromyalgia   . Fracture of thumb 09/01/2015   right  . Fracture, foot 09/01/2015   right  . GERD (gastroesophageal reflux disease)   . H/O hiatal hernia   . History of bronchitis   . History of kidney stones   . Hypercholesterolemia   . Hypertension    dr t turner  . Hypothyroidism   .  Neuropathy   . Osteoporosis   . Parkinson disease ([place])   . PVD (peripheral vascular disease) ([facility])    99% stenosis of left anteiror tibial artery, mod stenosis of left distal SFA and popliteal artery followed by [name]  . RBBB    noted on EKG 2018  . Restless leg   . Shortness of breath   . Sjogren's disease ([place])   . SOB (shortness of breath)    chronic due to diastolic dysfunction, deconditioning, obesity  . Tremor    Family History  Problem Relation Age of Onset  . Heart attack Mother   . Restless legs syndrome Mother   . Heart failure Mother   . Heart disease Mother   . Hypertension Mother   . COPD Father    Past Surgical History:  Procedure Laterality Date  . ABDOMINAL HYSTERECTOMY    . BACK SURGERY     4 back surgeries,   lumbar fusion  . CARDIAC CATHETERIZATION  2006   normal  . EYE SURGERY Bilateral    cateracts  . falls     variious fall, broken wrist,and toes  . FRACTURE SURGERY Right April 2016   Wrist, Pt. fell  . SPINAL CORD STIMULATOR INSERTION N/A 09/09/2015   Procedure: LUMBAR SPINAL CORD STIMULATOR INSERTION;  Surgeon: Clydell Hakim, MD;  Location: [facility];  Service:  Neurosurgery;  Laterality: N/A;  LUMBAR SPINAL CORD STIMULATOR INSERTION  . SPINE SURGERY  April 2013   Back X's 4  . TOTAL HIP ARTHROPLASTY     right  . WRIST FRACTURE SURGERY Bilateral    Social History   Socioeconomic History  . Marital status: Married    Spouse name: [name]  . Number of children: 2  . Years of education: HS  . Highest education level: Not on file  Social Needs  . Financial resource strain: Not on file  . Food insecurity - worry: Not on file  . Food insecurity - inability: Not on file  . Transportation needs - medical: Not on file  . Transportation needs - non-medical: Not on file  Occupational History    Comment: Homemaker  Tobacco Use  . Smoking status: Never Smoker  . Smokeless tobacco: Never Used  Substance and Sexual Activity  . Alcohol use: No    Alcohol/week: 0.0 oz  . Drug use: No  . Sexual activity: No  Other Topics Concern  . Not on file  Social History Narrative   Patient lives at home with her spouse. Married in 1957. Two people lives in the home, no pets.    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    Caffeine Use: tea     Review of Systems: General: negative for chills, fever, night sweats or weight changes.  Cardiovascular: negative for chest pain, dyspnea on exertion, edema, orthopnea, palpitations, paroxysmal nocturnal dyspnea or shortness of breath Dermatological: negative for rash Respiratory: negative for cough or wheezing Urologic: negative for hematuria Abdominal: negative for nausea, vomiting, diarrhea, bright red blood per rectum, melena, or hematemesis Neurologic: negative for visual changes, syncope, or dizziness All other systems reviewed and are otherwise negative except as noted above.   Physical Exam:  Blood pressure 138/60, pulse 67, height 4\' 10"  (1.473 m), weight 161 lb (73 kg), SpO2 99 %.  General appearance: alert, cooperative and no distress Neck: no  carotid bruit and no JVD Lungs: clear to auscultation bilaterally Heart: regular rate and  rhythm and 2/6 murmur loudest at RUSB Extremities: extremities normal, atraumatic, no cyanosis or edema Pulses: 2+ and symmetric Skin: Skin color, texture, turgor normal. No rashes or lesions Neurologic: Grossly normal  EKG NSR low voltage QRS -- personally reviewed   ASSESSMENT AND PLAN:   1. Exertional Dyspnea/ Abnormal NST: we discussed indication for [facility], as recommended by Dr. Radford Pax. I've discussed pt case with Dr. Radford Pax today. She would be ok with pt going to [place] post stent placement if stable. We will plan to do cath this week. I have reviewed the risks, indications, and alternatives to cardiac catheterization and possible angioplasty/stenting with the patient. Risks include but are not limited to bleeding, infection, vascular injury, stroke, myocardial infection, arrhythmia, kidney injury, radiation-related injury in the case of prolonged fluoroscopy use, emergency cardiac surgery, and death. The patient understands the risks of serious complication is low (<5%). We will get pre-cath labs today.   2. Aortic Stenosis: Moderate by recent echo. R/LHC as planned to assess for CAD given abnormal NST. Continue to follow.   3. Chronic Diastolic CHF: appears euvolemic on exam. Will get RHC this week to measure heart pressures.   Follow-Up post Cath.   Kidada Ging Ladoris Gene, MHS [facility] 02/03/2018 11:56 AM

## 2018-02-03 NOTE — Patient Instructions (Addendum)
Medication Instructions:  1. Your physician recommends that you continue on your current medications as directed. Please refer to the Current Medication list given to you today.   Labwork: TODAY BMET, CBC, PT/INR   Testing/Procedures: Your physician has requested that you have a cardiac catheterization. Cardiac catheterization is used to diagnose and/or treat various heart conditions. Doctors may recommend this procedure for a number of different reasons. The most common reason is to evaluate chest pain. Chest pain can be a symptom of coronary artery disease (CAD), and cardiac catheterization can show whether plaque is narrowing or blocking your heart's arteries. This procedure is also used to evaluate the valves, as well as measure the blood flow and oxygen levels in different parts of your heart. For further information please visit [email or web address]. Please follow instruction sheet, as given.    Follow-Up: 02/13/18 @ 11:30 AM WITH [name], PAC POST PROCEDURE FOLLOW UP  Any Other Special Instructions Will Be Listed Below (If Applicable).  If you need a refill on your cardiac medications before your next appointment, please call your pharmacy.    [facility] [address] [place] [address] [place] [address] Dept: [phone] Loc: [phone]  [name]  02/03/2018  You are scheduled for a Cardiac Catheterization on Wednesday, [place] 27 with Dr. Shelva Majestic.  1. Please arrive at the [facility] (Main Entrance A) at [facility]: [address] [place], [place] [address] at 12:30 PM (two hours before your procedure to ensure your preparation). Free valet parking service is available.   Special note: Every effort is made to have your procedure done on time. Please understand that emergencies sometimes delay scheduled procedures.  2. Diet: You may have a clear liquid  breakfast, but nothing after 8 AM on your procedure day.  3. Labs: You will need to have blood drawn on Monday, February 25 at [facility] at [facility] [address]  Open: 7:30am - 5pm    Phone: [phone]. You do not need to be fasting.  4. Medication instructions in preparation for your procedure: On the morning of your procedure, take your Aspirin and any morning medicines You may use SMALL sips of water.  5. Plan for one night stay--bring personal belongings. 6. Bring a current list of your medications and current insurance cards. 7. You MUST have a responsible person to drive you home. 8. Someone MUST be with you the first 24 hours after you arrive home or your discharge will be delayed. 9. Please wear clothes that are easy to get on and off and wear slip-on shoes.  Thank you for allowing [place] to care for you!   -- [name]

## 2018-02-04 ENCOUNTER — Telehealth: Payer: Self-pay | Admitting: *Deleted

## 2018-02-04 ENCOUNTER — Ambulatory Visit: Payer: Medicare Other | Admitting: Internal Medicine

## 2018-02-04 ENCOUNTER — Encounter: Payer: Self-pay | Admitting: Internal Medicine

## 2018-02-04 VITALS — BP 116/60 | HR 62 | Temp 97.7°F | Resp 16 | Ht 63.0 in | Wt 165.2 lb

## 2018-02-04 DIAGNOSIS — E7800 Pure hypercholesterolemia, unspecified: Secondary | ICD-10-CM

## 2018-02-04 DIAGNOSIS — I739 Peripheral vascular disease, unspecified: Secondary | ICD-10-CM | POA: Diagnosis not present

## 2018-02-04 DIAGNOSIS — M549 Dorsalgia, unspecified: Secondary | ICD-10-CM

## 2018-02-04 DIAGNOSIS — N390 Urinary tract infection, site not specified: Secondary | ICD-10-CM

## 2018-02-04 DIAGNOSIS — I5032 Chronic diastolic (congestive) heart failure: Secondary | ICD-10-CM | POA: Diagnosis not present

## 2018-02-04 DIAGNOSIS — E039 Hypothyroidism, unspecified: Secondary | ICD-10-CM | POA: Diagnosis not present

## 2018-02-04 DIAGNOSIS — H903 Sensorineural hearing loss, bilateral: Secondary | ICD-10-CM

## 2018-02-04 DIAGNOSIS — K219 Gastro-esophageal reflux disease without esophagitis: Secondary | ICD-10-CM | POA: Diagnosis not present

## 2018-02-04 DIAGNOSIS — I10 Essential (primary) hypertension: Secondary | ICD-10-CM

## 2018-02-04 DIAGNOSIS — G20 Parkinson's disease: Secondary | ICD-10-CM | POA: Diagnosis not present

## 2018-02-04 DIAGNOSIS — K5909 Other constipation: Secondary | ICD-10-CM

## 2018-02-04 DIAGNOSIS — M3500 Sicca syndrome, unspecified: Secondary | ICD-10-CM

## 2018-02-04 DIAGNOSIS — M810 Age-related osteoporosis without current pathological fracture: Secondary | ICD-10-CM

## 2018-02-04 DIAGNOSIS — G8929 Other chronic pain: Secondary | ICD-10-CM

## 2018-02-04 HISTORY — DX: Sjogren syndrome, unspecified: M35.00

## 2018-02-04 LAB — BASIC METABOLIC PANEL
BUN / CREAT RATIO: 26 (ref 12–28)
BUN: 20 mg/dL (ref 8–27)
CO2: 22 mmol/L (ref 20–29)
CREATININE: 0.77 mg/dL (ref 0.57–1.00)
Calcium: 9.9 mg/dL (ref 8.7–10.3)
Chloride: 106 mmol/L (ref 96–106)
GFR calc Af Amer: 82 mL/min/{1.73_m2} (ref >59)
GFR calc non Af Amer: 71 mL/min/{1.73_m2} (ref >59)
Glucose: 95 mg/dL (ref 65–99)
POTASSIUM: 4.5 mmol/L (ref 3.5–5.2)
SODIUM: 143 mmol/L (ref 134–144)

## 2018-02-04 LAB — CBC
Hematocrit: 31.9 % — ABNORMAL LOW (ref 34.0–46.6)
Hemoglobin: 10.6 g/dL — ABNORMAL LOW (ref 11.1–15.9)
MCH: 29.7 pg (ref 26.6–33.0)
MCHC: 33.2 g/dL (ref 31.5–35.7)
MCV: 89 fL (ref 79–97)
PLATELETS: 330 10*3/uL (ref 150–379)
RBC: 3.57 x10E6/uL — ABNORMAL LOW (ref 3.77–5.28)
RDW: 14.5 % (ref 12.3–15.4)
WBC: 7.6 10*3/uL (ref 3.4–10.8)

## 2018-02-04 LAB — PROTIME-INR
INR: 1 (ref 0.8–1.2)
Prothrombin Time: 10.1 s (ref 9.1–12.0)

## 2018-02-04 NOTE — Telephone Encounter (Signed)
Catheterization scheduled at [facility] for: Wednesday February 27,2019 at 2:30 PM Arrival time and place: [facility] Main Entrance A/North Tower at: 12:30 PM  Nothing to eat after midnight night before cath/clear liquids until 7:30 AM, then nothing to eat or drink except medications with sips of water.  AM meds can be  taken pre-cath with sip of water including: ASA 81 mg am of cath  Patient has responsible person to drive home post procedure and observe patient for 24 hours  LMTCB for pt to discuss instructions

## 2018-02-04 NOTE — Progress Notes (Signed)
Location:      Place of Service:    Provider:  Blanchie Serve MD  Carol Ada, MD  Patient Care Team: Carol Ada, MD as PCP - General (Family Medicine) [name], MD (Neurosurgery) [name], MD (Neurology) [name], MD as Consulting Physician (Gastroenterology) [name], MD as Consulting Physician (Urology)  Extended Emergency Contact Information Primary Emergency Contact: [facility] Address: [address]          [place], [place] [address] [name] of [place] Phone: [phone] Mobile Phone: [phone] Relation: Spouse Secondary Emergency Contact: [name] Mobile Phone: [phone] Relation: Daughter Preferred language: [name] Interpreter needed? No   Goals of care: Advanced Directive information Advanced Directives 07/04/2017  Does Patient Have a Medical Advance Directive? Yes  Type of Paramedic of [place];Living will  Does patient want to make changes to medical advance directive? -  Copy of [place] in Chart? No - copy requested  Would patient like information on creating a medical advance directive? -  Pre-existing out of facility DNR order (yellow form or pink MOST form) -     Chief Complaint  Patient presents with  . New Patient (Initial Visit)    establish care  . Medication Refill    No refills needed at this time.     HPI:  [name] is a 84 y.o. female seen today to establish care. She is here with her husband. She was seeing Dr Carol Ada from [place] as her PCP. She was last seen before Christmas. She follows with cardiology service Dr Radford Pax, nephrology Dr Amalia Hailey, Dr Lenna Gilford from rheumatology for her Sjogren's, pulmonary, neurosurgery and Dr Leta Baptist from neurology service. She also follows with eye and foot doctor and her dentist.  Chronic diastolic CHF- heart cath 4008 showed normal coronary arteries with normal LVF and  normal LVEDP. Echocardiogram 01/31/18 with normal LVEF and moderate AS. She is pending right and left heart catheterization tomorrow. Reviewed nuclear stress test 01/31/18, EF 67%. Denies chest pain. Patient has dyspnea.   Hypertension- currently on lopressor 25 mg daily. Controlled BP.   PVD- with chronic leg edema, followed by Dr Oneida Alar. On aspirin and statin.   Sensorineural hearing loss- hearing aids, followed by audiology service at [facility].   Parkinson's disease- seen by neurology service, last seen 11/15/17, notes reviewed. Currently on pramipexole 1 mg tid, carbidopa-levodopa 2 tab tid, rolling walker for ambulation. [name] has onging memory issues since atleast 2016 per records.   Recurrent UTI- seen by nephrology and on chronic trimethoprim 100 mg daily  Hypothyroidism- currently on levothyroxine 50 mcg daily.   Chronic back pain- currently on tramadol 200 mg daily and 50 mg TID prn. Has a pain stimulator to back.   Hyperlipidemia- currently on lovastatin 40 mg daily.   Osteoporosis- currently on prolia injection twice a year.  sjogrens syndrome- currently on plaquenil 200 mg daily  GERD- has history of hiatal hernia. Currently on dexlansoprazole 60 mg daily. She follows with Dr Oletta Lamas. Denies any episodes of GI bleed and peptic ulcer. She has had EGD last year and colonoscopy in past years.   Chronic constipation- currently on colace daily and senna as needed.   Past Medical History:  Diagnosis Date  . Anemia   . Anxiety   . Aortic stenosis    moderate by echo 2019  . Arthritis   . Broken arm    right   . Chronic diastolic CHF (congestive heart failure) (  [place])   . Family history of adverse reaction to anesthesia    [name]. states sister vomits  . Fibromyalgia   . Fracture of thumb 09/01/2015   right  . Fracture, foot 09/01/2015   right  . GERD (gastroesophageal reflux disease)   . H/O hiatal hernia   . History of bronchitis   . History of kidney stones   .  Hypercholesterolemia   . Hypertension    dr t turner  . Hypothyroidism   . Neuropathy   . Osteoporosis   . Parkinson disease ([place])   . PVD (peripheral vascular disease) ([facility])    99% stenosis of left anteiror tibial artery, mod stenosis of left distal SFA and popliteal artery followed by [name]  . RBBB    noted on EKG 2018  . Restless leg   . Shortness of breath   . Sjogren's disease ([place])   . SOB (shortness of breath)    chronic due to diastolic dysfunction, deconditioning, obesity  . Tremor    Past Surgical History:  Procedure Laterality Date  . ABDOMINAL HYSTERECTOMY    . BACK SURGERY     4 back surgeries,   lumbar fusion  . CARDIAC CATHETERIZATION  2006   normal  . EYE SURGERY Bilateral    cateracts  . falls     variious fall, broken wrist,and toes  . FRACTURE SURGERY Right April 2016   Wrist, [name]. fell  . SPINAL CORD STIMULATOR INSERTION N/A 09/09/2015   Procedure: LUMBAR SPINAL CORD STIMULATOR INSERTION;  Surgeon: Clydell Hakim, MD;  Location: [facility];  Service: Neurosurgery;  Laterality: N/A;  LUMBAR SPINAL CORD STIMULATOR INSERTION  . SPINE SURGERY  April 2013   Back X's 4  . TOTAL HIP ARTHROPLASTY     right  . WRIST FRACTURE SURGERY Bilateral     Allergies  Allergen Reactions  . Sulfa Antibiotics Other (See Comments)    Headache, very sick  . Codeine Other (See Comments)    Reaction:  Headaches and nightmares   . Lactose Intolerance (Gi) Nausea And Vomiting  . Latex Rash  . Lyrica [Pregabalin] Swelling and Other (See Comments)    Reaction:  Leg swelling  . Other Other (See Comments)    [name] states that pain medications give her nightmares.    . Plaquenil [Hydroxychloroquine Sulfate] Other (See Comments)    Reaction:  GI upset   . Reglan [Metoclopramide] Other (See Comments)    Reaction:  GI upset   . Requip [Ropinirole Hcl] Other (See Comments)    Reaction:  GI upset   . Septra [Sulfamethoxazole-Trimethoprim] Nausea And Vomiting  . Shellfish  Allergy Nausea And Vomiting    Outpatient Encounter Medications as of 02/04/2018  Medication Sig  . aspirin EC 81 MG tablet Take 81 mg by mouth at bedtime.   . Calcium Carb-Cholecalciferol (CALCIUM 1000 + D) 1000-800 MG-UNIT TABS Take 1 tablet by mouth daily.  . carbidopa-levodopa (SINEMET IR) 25-100 MG tablet Take 2 tablets by mouth 3 (three) times daily.  . celecoxib (CELEBREX) 200 MG capsule Take 200 mg by mouth 2 (two) times daily.  . Cholecalciferol (VITAMIN D) 2000 units CAPS Take 2,000 Units by mouth daily.  Marland Kitchen denosumab (PROLIA) 60 MG/ML SOLN injection Inject 60 mg into the skin every 6 (six) months. Administer in upper arm, thigh, or abdomen  . dexlansoprazole (DEXILANT) 60 MG capsule Take 60 mg by mouth daily.  . diphenhydramine-acetaminophen (TYLENOL PM) 25-500 MG TABS Take 1 tablet by mouth at  bedtime.  . docusate sodium (COLACE) 100 MG capsule Take 100 mg by mouth at bedtime.  . DULoxetine (CYMBALTA) 20 MG capsule Take 20 mg by mouth daily.  Marland Kitchen ezetimibe (ZETIA) 10 MG tablet Take 10 mg by mouth at bedtime.   . hydroxychloroquine (PLAQUENIL) 200 MG tablet Take 200 mg by mouth daily.   Marland Kitchen levothyroxine (SYNTHROID, LEVOTHROID) 50 MCG tablet Take 50 mcg by mouth daily before breakfast.   . lovastatin (MEVACOR) 40 MG tablet Take 40 mg by mouth at bedtime.  . metoprolol tartrate (LOPRESSOR) 25 MG tablet TAKE 1 TABLET ONCE DAILY.  . Multiple Vitamin (MULTIVITAMIN WITH MINERALS) TABS tablet Take 1 tablet by mouth daily.  Vladimir Faster Glycol-Propyl Glycol (SYSTANE) 0.4-0.3 % GEL ophthalmic gel Place 1 application into both eyes at bedtime.  Vladimir Faster Glycol-Propyl Glycol (SYSTANE) 0.4-0.3 % SOLN Place 1 drop into both eyes daily.   . pramipexole (MIRAPEX) 1 MG tablet Take 1 tablet (1 mg total) by mouth 3 (three) times daily.  . Skin Protectants, Misc. (EUCERIN) cream Apply 1 application topically daily.   . traMADol (ULTRAM) 50 MG tablet Take 50 mg by mouth 3 (three) times daily as needed.    . traMADol (ULTRAM-ER) 200 MG 24 hr tablet Take 200 mg by mouth daily.   Marland Kitchen trimethoprim (TRIMPEX) 100 MG tablet Take 100 mg by mouth daily.  Marland Kitchen lidocaine (XYLOCAINE) 5 % ointment Apply 2-3 application topically 4 (four) times daily. Apply to affected area/feet (Patient not taking: Reported on 02/03/2018)   No facility-administered encounter medications on file as of 02/04/2018.     Review of Systems  Gastrointestinal: Positive for constipation. Negative for abdominal pain, blood in stool, diarrhea, nausea and vomiting.       Reflux medication helps. Has history of hemorrhoids.   Genitourinary: Negative for dysuria, flank pain, frequency, hematuria and vaginal discharge.       Wakes up once at night to urinate. Has had history of UTIs. Has history of kidney stones. Followed by nephrology.     Immunization History  Administered Date(s) Administered  . Influenza Split 09/09/2013  . Influenza,inj,Quad PF,6+ Mos 09/11/2016  . Tdap 09/01/2015   Pertinent  Health Maintenance Due  Topic Date Due  . PNA vac Low Risk Adult (1 of 2 - PCV13) 10/01/1998  . INFLUENZA VACCINE  07/10/2017  . DEXA SCAN  Completed   Fall Risk  11/15/2017 02/13/2017 10/13/2015 03/02/2015  Falls in the past year? No Yes Yes Yes  Number falls in past yr: - 2 or more 1 1  Injury with Fall? - Yes Yes Yes  Comment - stitches in forehead, fx finger r foot fx, thumb fx -  Risk for fall due to : - Impaired mobility;Impaired balance/gait (No Data) -  Risk for fall due to: Comment - - unsure of reason for fall -   Functional Status Survey:    Vitals:   02/04/18 1020  BP: 116/60  Pulse: 62  Resp: 16  Temp: 97.7 F (36.5 C)  TempSrc: Oral  SpO2: 97%  Weight: 165 lb 3.2 oz (74.9 kg)  Height: 5\' 3"  (1.6 m)   Body mass index is 29.26 kg/m. Physical Exam  Constitutional: No distress.  Overweight, in no acute distress.   HENT:  Head: Normocephalic and atraumatic.  Nose: Nose normal.  Mouth/Throat: Oropharynx is clear  and moist. No oropharyngeal exudate.  Eyes: Conjunctivae and EOM are normal. Pupils are equal, round, and reactive to light. Right eye exhibits no discharge. Left  eye exhibits no discharge. No scleral icterus.  Neck: Neck supple.  Cardiovascular: Normal rate and regular rhythm.  Pulmonary/Chest: Effort normal and breath sounds normal. She has no wheezes. She has no rales.  Abdominal: Soft. Bowel sounds are normal. There is no tenderness. There is no guarding.  Musculoskeletal: She exhibits deformity. She exhibits no edema or tenderness.  Lymphadenopathy:    She has no cervical adenopathy.  Neurological: She is alert.  Oriented to person and place but not to time  Skin: Skin is warm and dry. No rash noted. She is not diaphoretic.  Psychiatric: She has a normal mood and affect.    Labs reviewed: Recent Labs    01/24/18 1059 02/03/18 1220  NA 139 143  K 4.6 4.5  CL 105 106  CO2 20 22  GLUCOSE 96 95  BUN 20 20  CREATININE 0.89 0.77  CALCIUM 9.7 9.9   Recent Labs    01/31/18 0821  AST 32  ALT 7  ALKPHOS 56  BILITOT 0.4  PROT 6.1  ALBUMIN 4.2   Recent Labs    02/03/18 1220  WBC 7.6  HGB 10.6*  HCT 31.9*  MCV 89  PLT 330   No results found for: TSH No results found for: HGBA1C Lab Results  Component Value Date   CHOL 155 01/31/2018   HDL 61 01/31/2018   LDLCALC 55 01/31/2018   TRIG 196 (H) 01/31/2018   CHOLHDL 2.5 01/31/2018    Significant Diagnostic Results in last 30 days:  Ct Thoracic Spine Wo Contrast  Result Date: 01/06/2018 CLINICAL DATA:  Low back pain.  Spinal cord stimulator is present. EXAM: CT THORACIC SPINE WITHOUT CONTRAST TECHNIQUE: Multidetector CT images of the thoracic were obtained using the standard protocol without intravenous contrast. COMPARISON:  None. FINDINGS: Alignment: Normal. Vertebrae: No acute fracture or focal pathologic process. Paraspinal and other soft tissues: No focal paraspinal abnormality. Coronary artery atherosclerosis.  Thoracic aortic atherosclerosis. Nonobstructing left renal calculus. Disc levels: T2-3: Severe degenerative disc disease with disc height loss and a broad-based disc osteophyte complex. No foraminal or central canal stenosis. T3-4: Mild broad-based disc bulge. No foraminal or central canal stenosis. T4-5: No significant disc height loss. Moderate right facet arthropathy. Mild left facet arthropathy. No foraminal stenosis. T5-6: Disc space is maintained. No foraminal or central canal stenosis. T6-7: Mild broad-based disc bulge. Metallic portion of the spinal stimulator terminates at the level of T6. T7-8: Mild degenerative disc disease. No foraminal or central canal stenosis. T8-9: Mild degenerative disc disease. No foraminal or central canal stenosis. T9-10: Mild degenerative disc disease with disc height loss. No foraminal or central canal stenosis. T10-11: Mild broad-based disc bulge. Mild degenerative disc disease with disc height loss. T11-12: Degenerative disease disc height loss. No foraminal or central canal stenosis. T12-L1: Degenerative disc disease disc height loss. No foraminal or central canal stenosis. IMPRESSION: 1. Thoracic spine spondylosis. 2. Metallic portion of the spinal stimulator terminates at the level of T6. 3. Nonobstructing left renal calculus. 4.  Aortic Atherosclerosis (ICD10-I70.0). Electronically Signed   By: Kathreen Devoid   On: 01/06/2018 13:54   Ct Lumbar Spine Wo Contrast  Result Date: 01/06/2018 CLINICAL DATA:  Low back pain EXAM: CT LUMBAR SPINE WITHOUT CONTRAST TECHNIQUE: Multidetector CT imaging of the lumbar spine was performed without intravenous contrast administration. Multiplanar CT image reconstructions were also generated. COMPARISON:  07/25/2016 FINDINGS: Segmentation: 5 lumbar type vertebrae. Alignment: Stable grade 1 anterolisthesis of L4 on L5. 6 mm retrolisthesis of L1 on  L2. 2 mm anterolisthesis of L3 on L4. Vertebrae: Vertebral body heights are maintained. No  discitis or osteomyelitis. Paraspinal and other soft tissues: No acute paraspinal abnormality. Abdominal aortic atherosclerosis. Bilateral nonobstructing renal calculi. Disc levels: Disc spaces: Degenerative disc disease with severe disc height loss at L1-2. posterior lumbar fusion from L2 through S1 without hardware failure or complication. Interbody fusion with osseous bridging at L2-3, L4-5 and L5-S1. Posterior decompression at L2 and L5-S1. T12-L1: Small left foraminal disc protrusion. No foraminal or central canal stenosis. L1-L2: Calcified right paracentral disc protrusion. Mild left foraminal stenosis. Bilateral subarticular stenosis. Moderate right foraminal stenosis. L2-L3: Interbody fusion.  No foraminal stenosis. L3-L4: Broad-based disc bulge. Moderate bilateral facet arthropathy. No foraminal stenosis. L4-L5: Interbody fusion.  No foraminal stenosis. L5-S1: Interbody fusion.  Mild bilateral foraminal stenosis. IMPRESSION: 1. Posterior lumbar fusion from L2 through S1 without hardware failure or complication. Interbody fusion with osseous bridging at L2-3, L4-5 and L5-S1. Posterior decompression at L2 and L5-S1. 2.  Aortic Atherosclerosis (ICD10-I70.0). Electronically Signed   By: Kathreen Devoid   On: 01/06/2018 14:10    Assessment/Plan  Chronic diastolic CHF- heart cath 2993 showed normal coronary arteries with normal LVF and normal LVEDP. Echocardiogram 01/31/18 with normal LVEF and moderate AS. She is pending right and left heart catheterization tomorrow. Reviewed nuclear stress test 01/31/18, EF 67%. Denies chest pain. Patient has dyspnea.   Hypertension- continue lopressor 25 mg daily. Continue aspirin.   PVD- with chronic leg edema, followed by Dr Oneida Alar. continue aspirin and statin.   Sensorineural hearing loss- continue hearing aids  Parkinson's disease- continue pramipexole 1 mg tid, carbidopa-levodopa 2 tab tid, rolling walker for ambulation. Reviewed neurology note.   Recurrent UTI-  continue trimethoprim 100 mg daily  Hypothyroidism- continue levothyroxine 50 mcg daily.   Chronic back pain- continue tramadol 200 mg daily and 50 mg TID prn. Continue celecoxib and cymbalta  Hyperlipidemia- continue lovastatin 40 mg daily.   Osteoporosis- continue prolia injection twice a year. Continue vit d  sjogrens syndrome- continue plaquenil 200 mg daily  GERD- continue dexlansoprazole 60 mg daily. Obtain GI records for review   Chronic constipation- continue colace daily and senna as needed.     Family/ staff Communication: reviewed care plan with patient and her husband  Labs/tests ordered:  None, obtain prior records from her PCP and subspecialist. Reviewed notes from neurology and cardiology office.    Blanchie Serve, MD Internal Medicine [facility] [address] [place], [place] [address] Cell Phone (Monday-Friday 8 am - 5 pm): [phone] On Call: [phone] and follow prompts after 5 pm and on weekends Office Phone: [phone] Office Fax: [phone]

## 2018-02-04 NOTE — Telephone Encounter (Signed)
I spoke with patient's husband (DPR), confirmed and discussed cath instructions, he verbalized understanding.

## 2018-02-05 ENCOUNTER — Ambulatory Visit: Payer: Self-pay | Admitting: Diagnostic Neuroimaging

## 2018-02-05 ENCOUNTER — Encounter (HOSPITAL_COMMUNITY)
Admission: RE | Disposition: A | Discharge status: Home / Self Care | Payer: Self-pay | Source: Ambulatory Visit | Attending: Cardiovascular Disease

## 2018-02-05 ENCOUNTER — Ambulatory Visit (HOSPITAL_COMMUNITY)
Admission: RE | Admit: 2018-02-05 | Discharge: 2018-02-05 | Disposition: A | Discharge status: Home / Self Care | Payer: Medicare Other | Source: Ambulatory Visit | Attending: Cardiovascular Disease | Admitting: Cardiovascular Disease

## 2018-02-05 DIAGNOSIS — G2581 Restless legs syndrome: Secondary | ICD-10-CM | POA: Diagnosis not present

## 2018-02-05 DIAGNOSIS — Z7982 Long term (current) use of aspirin: Secondary | ICD-10-CM | POA: Diagnosis not present

## 2018-02-05 DIAGNOSIS — M1990 Unspecified osteoarthritis, unspecified site: Secondary | ICD-10-CM | POA: Insufficient documentation

## 2018-02-05 DIAGNOSIS — R9439 Abnormal result of other cardiovascular function study: Secondary | ICD-10-CM | POA: Diagnosis not present

## 2018-02-05 DIAGNOSIS — E669 Obesity, unspecified: Secondary | ICD-10-CM | POA: Diagnosis not present

## 2018-02-05 DIAGNOSIS — E7800 Pure hypercholesterolemia, unspecified: Secondary | ICD-10-CM | POA: Diagnosis not present

## 2018-02-05 DIAGNOSIS — I2510 Atherosclerotic heart disease of native coronary artery without angina pectoris: Secondary | ICD-10-CM | POA: Diagnosis not present

## 2018-02-05 DIAGNOSIS — F419 Anxiety disorder, unspecified: Secondary | ICD-10-CM | POA: Insufficient documentation

## 2018-02-05 DIAGNOSIS — I350 Nonrheumatic aortic (valve) stenosis: Secondary | ICD-10-CM | POA: Diagnosis not present

## 2018-02-05 DIAGNOSIS — I739 Peripheral vascular disease, unspecified: Secondary | ICD-10-CM | POA: Diagnosis not present

## 2018-02-05 DIAGNOSIS — Z885 Allergy status to narcotic agent status: Secondary | ICD-10-CM | POA: Insufficient documentation

## 2018-02-05 DIAGNOSIS — M3500 Sicca syndrome, unspecified: Secondary | ICD-10-CM | POA: Diagnosis not present

## 2018-02-05 DIAGNOSIS — M797 Fibromyalgia: Secondary | ICD-10-CM | POA: Diagnosis not present

## 2018-02-05 DIAGNOSIS — G20 Parkinson's disease: Secondary | ICD-10-CM | POA: Insufficient documentation

## 2018-02-05 DIAGNOSIS — Z6827 Body mass index (BMI) 27.0-27.9, adult: Secondary | ICD-10-CM | POA: Diagnosis not present

## 2018-02-05 DIAGNOSIS — K219 Gastro-esophageal reflux disease without esophagitis: Secondary | ICD-10-CM | POA: Diagnosis not present

## 2018-02-05 DIAGNOSIS — I4510 Unspecified right bundle-branch block: Secondary | ICD-10-CM | POA: Diagnosis not present

## 2018-02-05 DIAGNOSIS — Z882 Allergy status to sulfonamides status: Secondary | ICD-10-CM | POA: Insufficient documentation

## 2018-02-05 DIAGNOSIS — E039 Hypothyroidism, unspecified: Secondary | ICD-10-CM | POA: Insufficient documentation

## 2018-02-05 DIAGNOSIS — I5032 Chronic diastolic (congestive) heart failure: Secondary | ICD-10-CM | POA: Diagnosis not present

## 2018-02-05 DIAGNOSIS — G629 Polyneuropathy, unspecified: Secondary | ICD-10-CM | POA: Insufficient documentation

## 2018-02-05 DIAGNOSIS — I110 Hypertensive heart disease with heart failure: Secondary | ICD-10-CM | POA: Diagnosis not present

## 2018-02-05 HISTORY — PX: RIGHT/LEFT HEART CATH AND CORONARY ANGIOGRAPHY: CATH118266

## 2018-02-05 LAB — POCT I-STAT 3, VENOUS BLOOD GAS (G3P V)
ACID-BASE DEFICIT: 3 mmol/L — AB (ref 0.0–2.0)
BICARBONATE: 22 mmol/L (ref 20.0–28.0)
O2 SAT: 68 %
PCO2 VEN: 38.1 mmHg — AB (ref 44.0–60.0)
PH VEN: 7.37 (ref 7.250–7.430)
TCO2: 23 mmol/L (ref 22–32)
pO2, Ven: 36 mmHg (ref 32.0–45.0)

## 2018-02-05 SURGERY — RIGHT/LEFT HEART CATH AND CORONARY ANGIOGRAPHY
Anesthesia: LOCAL

## 2018-02-05 MED ORDER — SODIUM CHLORIDE 0.9% FLUSH: 3.0000 mL | Freq: Two times a day (BID) | INTRAVENOUS | Status: DC

## 2018-02-05 MED ORDER — SODIUM CHLORIDE 0.9% FLUSH: 3.0000 mL | INTRAVENOUS | Status: DC | PRN

## 2018-02-05 MED ORDER — ACETAMINOPHEN 325 MG PO TABS: 650.0000 mg | ORAL_TABLET | ORAL | Status: DC | PRN

## 2018-02-05 MED ORDER — MIDAZOLAM HCL 2 MG/2ML IJ SOLN
INTRAMUSCULAR | Status: DC | PRN
  Administered 2018-02-05: 1 mg via INTRAVENOUS

## 2018-02-05 MED ORDER — SODIUM CHLORIDE 0.9 % WEIGHT BASED INFUSION
3.0000 mL/kg/h | INTRAVENOUS | Status: DC
  Administered 2018-02-05: 3 mL/kg/h via INTRAVENOUS

## 2018-02-05 MED ORDER — SODIUM CHLORIDE 0.9 % WEIGHT BASED INFUSION: 1.0000 mL/kg/h | INTRAVENOUS | Status: DC

## 2018-02-05 MED ORDER — HEPARIN (PORCINE) IN NACL 2-0.9 UNIT/ML-% IJ SOLN
INTRAMUSCULAR | Status: AC | PRN
  Administered 2018-02-05: 500 mL

## 2018-02-05 MED ORDER — VERAPAMIL HCL 2.5 MG/ML IV SOLN
INTRAVENOUS | Status: AC
  Filled 2018-02-05: qty 2

## 2018-02-05 MED ORDER — LIDOCAINE HCL 1 % IJ SOLN
INTRAMUSCULAR | Status: AC
  Filled 2018-02-05: qty 20

## 2018-02-05 MED ORDER — FENTANYL CITRATE (PF) 100 MCG/2ML IJ SOLN
INTRAMUSCULAR | Status: DC | PRN
  Administered 2018-02-05: 25 ug via INTRAVENOUS

## 2018-02-05 MED ORDER — VERAPAMIL HCL 2.5 MG/ML IV SOLN
INTRAVENOUS | Status: DC | PRN
  Administered 2018-02-05: 10 mL via INTRA_ARTERIAL

## 2018-02-05 MED ORDER — LIDOCAINE HCL (PF) 1 % IJ SOLN
INTRAMUSCULAR | Status: DC | PRN
  Administered 2018-02-05 (×2): 3 mL

## 2018-02-05 MED ORDER — HEPARIN (PORCINE) IN NACL 2-0.9 UNIT/ML-% IJ SOLN
INTRAMUSCULAR | Status: AC
  Filled 2018-02-05: qty 1000

## 2018-02-05 MED ORDER — SODIUM CHLORIDE 0.9 % IV SOLN: INTRAVENOUS | Status: DC

## 2018-02-05 MED ORDER — ASPIRIN 81 MG PO CHEW: 81.0000 mg | CHEWABLE_TABLET | ORAL | Status: DC

## 2018-02-05 MED ORDER — IOPAMIDOL (ISOVUE-370) INJECTION 76%
INTRAVENOUS | Status: DC | PRN
  Administered 2018-02-05: 80 mL via INTRA_ARTERIAL

## 2018-02-05 MED ORDER — IOPAMIDOL (ISOVUE-370) INJECTION 76%
INTRAVENOUS | Status: AC
  Filled 2018-02-05: qty 100

## 2018-02-05 MED ORDER — MIDAZOLAM HCL 2 MG/2ML IJ SOLN
INTRAMUSCULAR | Status: AC
Start: 2018-02-05 — End: ?
  Filled 2018-02-05: qty 2

## 2018-02-05 MED ORDER — FENTANYL CITRATE (PF) 100 MCG/2ML IJ SOLN
INTRAMUSCULAR | Status: AC
  Filled 2018-02-05: qty 2

## 2018-02-05 MED ORDER — ONDANSETRON HCL 4 MG/2ML IJ SOLN: 4.0000 mg | Freq: Four times a day (QID) | INTRAMUSCULAR | Status: DC | PRN

## 2018-02-05 MED ORDER — SODIUM CHLORIDE 0.9 % IV SOLN: 250.0000 mL | INTRAVENOUS | Status: DC | PRN

## 2018-02-05 MED ORDER — HEPARIN SODIUM (PORCINE) 1000 UNIT/ML IJ SOLN
INTRAMUSCULAR | Status: AC
  Filled 2018-02-05: qty 1

## 2018-02-05 MED ORDER — HEPARIN SODIUM (PORCINE) 1000 UNIT/ML IJ SOLN
INTRAMUSCULAR | Status: DC | PRN
  Administered 2018-02-05: 3500 [IU] via INTRAVENOUS

## 2018-02-05 SURGICAL SUPPLY — 17 items
CATH BALLN WEDGE 5F 110CM (CATHETERS) ×1 IMPLANT
CATH INFINITI JR4 5F (CATHETERS) ×1 IMPLANT
CATH OPTITORQUE TIG 4.0 5F (CATHETERS) ×1 IMPLANT
DEVICE RAD COMP TR BAND LRG (VASCULAR PRODUCTS) ×1 IMPLANT
GLIDESHEATH SLEND SS 6F .021 (SHEATH) ×1 IMPLANT
GUIDEWIRE .025 260CM (WIRE) ×1 IMPLANT
GUIDEWIRE ANGLED .035X260CM (WIRE) IMPLANT
GUIDEWIRE INQWIRE 1.5J.035X260 (WIRE) IMPLANT
INQWIRE 1.5J .035X260CM (WIRE) ×2
KIT HEART LEFT (KITS) ×2 IMPLANT
PACK CARDIAC CATHETERIZATION (CUSTOM PROCEDURE TRAY) ×2 IMPLANT
SHEATH GLIDE SLENDER 4/5FR (SHEATH) ×1 IMPLANT
TRANSDUCER W/STOPCOCK (MISCELLANEOUS) ×2 IMPLANT
TUBING CIL FLEX 10 FLL-RA (TUBING) ×2 IMPLANT
WIRE EMERALD ST .035X260CM (WIRE) ×1 IMPLANT
WIRE HITORQ VERSACORE ST 145CM (WIRE) ×1 IMPLANT
WIRE MICROINTRODUCER 60CM (WIRE) ×1 IMPLANT

## 2018-02-05 NOTE — Progress Notes (Signed)
R brachial sheath pulled. Pressure held for 8 minutes, level 0. VSS. Gauze/transparent dsg clean, dry,intact.

## 2018-02-05 NOTE — Interval H&P Note (Signed)
Cath Lab Visit (complete for each Cath Lab visit)  Clinical Evaluation Leading to the Procedure:   ACS: No.  Non-ACS:    Anginal Classification: CCS III  Anti-ischemic medical therapy: Minimal Therapy (1 class of medications)  Non-Invasive Test Results: Intermediate-risk stress test findings: cardiac mortality 1-3%/year  Prior CABG: No previous CABG      History and Physical Interval Note:  02/05/2018 2:06 PM  [name]  has presented today for surgery, with the diagnosis of Chest Pain/SOB  The various methods of treatment have been discussed with the patient and family. After consideration of risks, benefits and other options for treatment, the patient has consented to  Procedure(s): RIGHT/LEFT HEART CATH AND CORONARY ANGIOGRAPHY (N/A) as a surgical intervention .  The patient's history has been reviewed, patient examined, no change in status, stable for surgery.  I have reviewed the patient's chart and labs.  Questions were answered to the patient's satisfaction.     Shelva Majestic

## 2018-02-05 NOTE — Discharge Instructions (Signed)

## 2018-02-06 ENCOUNTER — Encounter (HOSPITAL_COMMUNITY): Payer: Self-pay | Admitting: Cardiovascular Disease

## 2018-02-06 LAB — POCT I-STAT 3, VENOUS BLOOD GAS (G3P V)
Acid-base deficit: 2 mmol/L (ref 0.0–2.0)
BICARBONATE: 23 mmol/L (ref 20.0–28.0)
O2 SAT: 69 %
PH VEN: 7.398 (ref 7.250–7.430)
PO2 VEN: 36 mmHg (ref 32.0–45.0)
TCO2: 24 mmol/L (ref 22–32)
pCO2, Ven: 37.3 mmHg — ABNORMAL LOW (ref 44.0–60.0)

## 2018-02-06 LAB — POCT I-STAT 3, ART BLOOD GAS (G3+)
Acid-base deficit: 3 mmol/L — ABNORMAL HIGH (ref 0.0–2.0)
Bicarbonate: 21.8 mmol/L (ref 20.0–28.0)
O2 SAT: 98 %
PCO2 ART: 36.7 mmHg (ref 32.0–48.0)
TCO2: 23 mmol/L (ref 22–32)
pH, Arterial: 7.382 (ref 7.350–7.450)
pO2, Arterial: 110 mmHg — ABNORMAL HIGH (ref 83.0–108.0)

## 2018-02-06 MED FILL — Lidocaine HCl Local Inj 1%: INTRAMUSCULAR | Qty: 20 | Status: AC

## 2018-02-13 ENCOUNTER — Encounter: Payer: Self-pay | Admitting: Cardiology

## 2018-02-13 ENCOUNTER — Encounter: Payer: Self-pay | Admitting: *Deleted

## 2018-02-13 ENCOUNTER — Ambulatory Visit (INDEPENDENT_AMBULATORY_CARE_PROVIDER_SITE_OTHER): Payer: Medicare Other | Admitting: Cardiology

## 2018-02-13 VITALS — BP 108/58 | HR 64 | Ht 62.0 in | Wt 161.1 lb

## 2018-02-13 DIAGNOSIS — I2510 Atherosclerotic heart disease of native coronary artery without angina pectoris: Secondary | ICD-10-CM

## 2018-02-13 DIAGNOSIS — I350 Nonrheumatic aortic (valve) stenosis: Secondary | ICD-10-CM | POA: Diagnosis not present

## 2018-02-13 NOTE — Patient Instructions (Addendum)
Medication Instructions:  Your physician recommends that you continue on your current medications as directed. Please refer to the Current Medication list given to you today.    Labwork: None ordered  Testing/Procedures: None ordered  Follow-Up: Your physician wants you to follow-up in: 1 YEAR WITH DR. TURNER    You will receive a reminder letter in the mail two months in advance. If you don't receive a letter, please call our office to schedule the follow-up appointment.   Any Other Special Instructions Will Be Listed Below (If Applicable).     If you need a refill on your cardiac medications before your next appointment, please call your pharmacy.  . 

## 2018-02-13 NOTE — Progress Notes (Signed)
02/13/2018 [name]   12-23-1930  [id number]  Primary Physician Carol Ada, MD Primary Cardiologist: Dr. Radford Pax   Reason for Visit/CC: Post Procedure F/u S/p [facility] for CAD and Aortic Stenosis   HPI:  84 y.o.femalewith a hx of chronic SOB secondary to diastolic dysfunction, chronic diastolic CHF, deconditioning and obesity.Aheart cath in 2006 showed normal coronary arteries and normal LVF with normal LVEDP.She also has a history of HTN,aortic stenosis,chronic LE edema and PVD followed by [name]. Pt also has parkinson's disease.   She was recently seen by Dr. Radford Pax for her yearly f/u on 01/24/18 and noted worsening SOB but was noted to appear euvolemic on exam. Dr. Radford Pax ordered for her to get a repeat 2D echo to reassess the status of her aortic valve. Echo performed 01/31/18 showed normal LVEF and moderate AS. She was also evluated by nuclear stress test which showed a large fixed defect in the septum and apex and large partially reversible lateral defect. Subsequently, Dr. Radford Pax has recommended [facility].   Pt had cardiac cath last week with Dr. Claiborne Billings. This showed mild nonobstructive CAD with 30-40% proximal LAD stenosis before the first diagonal vessel; normal left circumflex and normal RCA. Upper normal right heart pressures. Mild-to-moderate aortic stenosis with a mean gradient of 14 mm and aortic valve area of 1.4 centimeters squared. Dr. Claiborne Billings recommend continued medical therapy for CAD and continued monitoring of her aortic stenosis.   Pt presents to clinic today for f/u. She has been doing well. No post cath complications. She denies any issues with her cath access site (radial access). BP is well controlled.   She has plans to travel with her husband next week to [place] for sister's 80th birthday and is looking forward to this.   Cardiac Procedures: Procedures   RIGHT/LEFT HEART CATH AND CORONARY ANGIOGRAPHY 02/05/18  Conclusion     Prox LAD lesion is 35%  stenosed.   Mild nonobstructive CAD with 30-40% proximal LAD stenosis before the first diagonal vessel; normal left circumflex and normal RCA.  Upper normal right heart pressures.  Mild-to-moderate aortic stenosis with a mean gradient of 14 mm and aortic valve area of 1.4 centimeters squared.      Current Meds  Medication Sig  . aspirin EC 81 MG tablet Take 81 mg by mouth at bedtime.   . Calcium Carb-Cholecalciferol (CALCIUM 1000 + D) 1000-800 MG-UNIT TABS Take 1 tablet by mouth daily.  . carbidopa-levodopa (SINEMET IR) 25-100 MG tablet Take 2 tablets by mouth 3 (three) times daily.  . celecoxib (CELEBREX) 200 MG capsule Take 200 mg by mouth 2 (two) times daily.  . Cholecalciferol (VITAMIN D) 2000 units CAPS Take 2,000 Units by mouth daily.  Marland Kitchen denosumab (PROLIA) 60 MG/ML SOLN injection Inject 60 mg into the skin every 6 (six) months. Administer in upper arm, thigh, or abdomen  . dexlansoprazole (DEXILANT) 60 MG capsule Take 60 mg by mouth daily.  . diphenhydramine-acetaminophen (TYLENOL PM) 25-500 MG TABS Take 1 tablet by mouth at bedtime.  . docusate sodium (COLACE) 100 MG capsule Take 100 mg by mouth at bedtime.  . DULoxetine (CYMBALTA) 20 MG capsule Take 20 mg by mouth daily.  Marland Kitchen ezetimibe (ZETIA) 10 MG tablet Take 10 mg by mouth at bedtime.   . hydroxychloroquine (PLAQUENIL) 200 MG tablet Take 200 mg by mouth daily.   Marland Kitchen levothyroxine (SYNTHROID, LEVOTHROID) 50 MCG tablet Take 50 mcg by mouth daily before breakfast.   . lidocaine (XYLOCAINE) 5 % ointment Apply 2-3  application topically 4 (four) times daily. Apply to affected area/feet  . lovastatin (MEVACOR) 40 MG tablet Take 40 mg by mouth at bedtime.  . metoprolol tartrate (LOPRESSOR) 25 MG tablet TAKE 1 TABLET ONCE DAILY.  . Multiple Vitamin (MULTIVITAMIN WITH MINERALS) TABS tablet Take 1 tablet by mouth daily.  Vladimir Faster Glycol-Propyl Glycol (SYSTANE) 0.4-0.3 % GEL ophthalmic gel Place 1 application into both eyes at bedtime.   Vladimir Faster Glycol-Propyl Glycol (SYSTANE) 0.4-0.3 % SOLN Place 1 drop into both eyes daily.   . pramipexole (MIRAPEX) 1 MG tablet Take 1 tablet (1 mg total) by mouth 3 (three) times daily.  . Skin Protectants, Misc. (EUCERIN) cream Apply 1 application topically daily.   . traMADol (ULTRAM) 50 MG tablet Take 50 mg by mouth 3 (three) times daily as needed.  . traMADol (ULTRAM-ER) 200 MG 24 hr tablet Take 200 mg by mouth daily.   Marland Kitchen trimethoprim (TRIMPEX) 100 MG tablet Take 100 mg by mouth daily.   Allergies  Allergen Reactions  . Sulfa Antibiotics Other (See Comments)    Headache, very sick  . Codeine Other (See Comments)    Reaction:  Headaches and nightmares   . Lactose Intolerance (Gi) Nausea And Vomiting  . Latex Rash  . Lyrica [Pregabalin] Swelling and Other (See Comments)    Reaction:  Leg swelling  . Other Other (See Comments)    Pt states that pain medications give her nightmares.    . Plaquenil [Hydroxychloroquine Sulfate] Other (See Comments)    Reaction:  GI upset   . Reglan [Metoclopramide] Other (See Comments)    Reaction:  GI upset   . Requip [Ropinirole Hcl] Other (See Comments)    Reaction:  GI upset   . Septra [Sulfamethoxazole-Trimethoprim] Nausea And Vomiting  . Shellfish Allergy Nausea And Vomiting   Past Medical History:  Diagnosis Date  . Anemia   . Anxiety   . Aortic stenosis    moderate by echo 2019  . Arthritis   . Broken arm    right   . Chronic diastolic CHF (congestive heart failure) ([place])   . Family history of adverse reaction to anesthesia    pt. states sister vomits  . Fibromyalgia   . Fracture of thumb 09/01/2015   right  . Fracture, foot 09/01/2015   right  . GERD (gastroesophageal reflux disease)   . H/O hiatal hernia   . History of bronchitis   . History of kidney stones   . Hypercholesterolemia   . Hypertension    dr t turner  . Hypothyroidism   . Neuropathy   . Osteoporosis   . Parkinson disease ([place])   . PVD (peripheral  vascular disease) ([facility])    99% stenosis of left anteiror tibial artery, mod stenosis of left distal SFA and popliteal artery followed by [name]  . RBBB    noted on EKG 2018  . Restless leg   . Shortness of breath   . Sjogren's disease ([place])   . SOB (shortness of breath)    chronic due to diastolic dysfunction, deconditioning, obesity  . Tremor    Family History  Problem Relation Age of Onset  . Heart attack Mother   . Restless legs syndrome Mother   . Heart failure Mother   . Heart disease Mother   . Hypertension Mother   . COPD Father    Past Surgical History:  Procedure Laterality Date  . ABDOMINAL HYSTERECTOMY    . BACK SURGERY  4 back surgeries,   lumbar fusion  . CARDIAC CATHETERIZATION  2006   normal  . EYE SURGERY Bilateral    cateracts  . falls     variious fall, broken wrist,and toes  . FRACTURE SURGERY Right April 2016   Wrist, Pt. fell  . RIGHT/LEFT HEART CATH AND CORONARY ANGIOGRAPHY N/A 02/05/2018   Procedure: RIGHT/LEFT HEART CATH AND CORONARY ANGIOGRAPHY;  Surgeon: Troy Sine, MD;  Location: [facility];  Service: Cardiovascular;  Laterality: N/A;  . SPINAL CORD STIMULATOR INSERTION N/A 09/09/2015   Procedure: LUMBAR SPINAL CORD STIMULATOR INSERTION;  Surgeon: Clydell Hakim, MD;  Location: [facility];  Service: Neurosurgery;  Laterality: N/A;  LUMBAR SPINAL CORD STIMULATOR INSERTION  . SPINE SURGERY  April 2013   Back X's 4  . TOTAL HIP ARTHROPLASTY     right  . WRIST FRACTURE SURGERY Bilateral    Social History   Socioeconomic History  . Marital status: Married    Spouse name: [name]  . Number of children: 2  . Years of education: HS  . Highest education level: Not on file  Social Needs  . Financial resource strain: Not on file  . Food insecurity - worry: Not on file  . Food insecurity - inability: Not on file  . Transportation needs - medical: Not on file  . Transportation needs - non-medical: Not on file  Occupational  History    Comment: Homemaker  Tobacco Use  . Smoking status: Never Smoker  . Smokeless tobacco: Never Used  Substance and Sexual Activity  . Alcohol use: No    Alcohol/week: 0.0 oz  . Drug use: No  . Sexual activity: No  Other Topics Concern  . Not on file  Social History Narrative   Patient lives at home with her spouse. Married in 1957. Two people lives in the home, no pets.    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    Caffeine Use: tea     Review of Systems: General: negative for chills, fever, night sweats or weight changes.  Cardiovascular: negative for chest pain, dyspnea on exertion, edema, orthopnea, palpitations, paroxysmal nocturnal dyspnea or shortness of breath Dermatological: negative for rash Respiratory: negative for cough or wheezing Urologic: negative for hematuria Abdominal: negative for nausea, vomiting, diarrhea, bright red blood per rectum, melena, or hematemesis Neurologic: negative for visual changes, syncope, or dizziness All other systems reviewed and are otherwise negative except as noted above.   Physical Exam:  Height 5\' 2"  (1.575 m), weight 161 lb 1.9 oz (73.1 kg).  General appearance: alert, cooperative and no distress Neck: no carotid bruit and no JVD Lungs: clear to auscultation bilaterally Heart: regular rate and rhythm, S1, S2 normal, no murmur, click, rub or gallop, regular rate and rhythm and 2/6 SM Extremities: extremities normal, atraumatic, no cyanosis or edema Pulses: 2+ and symmetric Skin: Skin color, texture, turgor normal. No rashes or lesions Neurologic: Grossly normal  EKG not performed -- personally reviewed   ASSESSMENT AND PLAN:   1. CAD: recent cath showed mild nonobstructive single vessel CAD in the proximal LAD, 30-40%. The RCA and LCx w/o disease. Continue medical therapy w/ ASA, statin, Zetia and BB. Her LDL is well controlled at 55 mg/dL.   2. Aortic Stenosis: mild  to moderate by recent heart cath. Mean gradient 14 mm Hg and aortic valve area of 1.4 centimeters squared. We will continue to monitor this yearly  by ultrasound.   3. Chronic Diastolic HF: euvolemic on exam. BP is well controlled. Continue BB.   Follow-Up w/ Dr. Radford Pax in 1 year.   Brittainy Ladoris Gene, M[facility] 02/13/2018 11:39 AM

## 2018-02-17 ENCOUNTER — Telehealth: Payer: Self-pay

## 2018-02-17 NOTE — Telephone Encounter (Signed)
   [facility] Pre-operative Risk Assessment    Request for surgical clearance:  1. What type of surgery is being performed?    - root canal, 2 crowns, and 3 fill-ins  2. When is this surgery scheduled?    - 03/21/2018  3. Are there any medications that need to be held prior to surgery and how long?   - N/A   4. Practice name and name of physician performing surgery?    - [facility]   - B. Bonnick, DDS  5. What is your office phone and fax number?    - Phone: [phone]   - Fax: [phone]     6. Anesthesia type (None, local, MAC, general) ?    Windy Carina 02/17/2018, 2:12 PM  _________________________________________________________________   (provider comments below)

## 2018-02-19 NOTE — Telephone Encounter (Signed)
OK for surgery and ok to hold ASA

## 2018-02-19 NOTE — Telephone Encounter (Signed)
   Primary Cardiologist: Fransico Him, MD  Chart reviewed as part of pre-operative protocol coverage. Recent cath showed mild nonobstructive CAD with 30-40% proximal LAD stenosis before the first diagonal vessel; normal left circumflex and normal RCA. Upper normal right heart pressures. Mild-to-moderate aortic stenosis with a mean gradient of 14 mm and aortic valve area of 1.4 centimeters.  Dr. Radford Pax, Can you comment on clearance and ASA? Please forward your response to P [facility] PREOP. Thank you  Leanor Kail, PA 02/19/2018, 3:25 PM

## 2018-03-17 ENCOUNTER — Telehealth: Payer: Self-pay | Admitting: Diagnostic Neuroimaging

## 2018-03-17 DIAGNOSIS — I10 Essential (primary) hypertension: Secondary | ICD-10-CM | POA: Diagnosis not present

## 2018-03-17 DIAGNOSIS — I5032 Chronic diastolic (congestive) heart failure: Secondary | ICD-10-CM | POA: Diagnosis not present

## 2018-03-17 DIAGNOSIS — G20 Parkinson's disease: Secondary | ICD-10-CM | POA: Diagnosis not present

## 2018-03-17 DIAGNOSIS — Z86010 Personal history of colonic polyps: Secondary | ICD-10-CM | POA: Diagnosis not present

## 2018-03-17 DIAGNOSIS — G894 Chronic pain syndrome: Secondary | ICD-10-CM | POA: Diagnosis not present

## 2018-03-17 DIAGNOSIS — K219 Gastro-esophageal reflux disease without esophagitis: Secondary | ICD-10-CM | POA: Diagnosis not present

## 2018-03-17 DIAGNOSIS — E039 Hypothyroidism, unspecified: Secondary | ICD-10-CM | POA: Diagnosis not present

## 2018-03-17 NOTE — Telephone Encounter (Signed)
Per Dr Leta Baptist, called husband and offered sooner appointment to see NP on day Dr Leta Baptist will be in office. Husband scheduled patient to be seen soonest available in May with Daun Peacock, NP. He verbalized understanding, appreciation.

## 2018-03-17 NOTE — Telephone Encounter (Signed)
Ok to see NP. -VRP

## 2018-03-17 NOTE — Telephone Encounter (Signed)
Pt husband(on DPR) is asking if pt can see a PA  Since Dr Leta Baptist does not have an office visit slot soon(pt declined 04-09 11:00 office visit slot) pt and spouse are asking for a call back to know if a NP can see pt to re evaluate pt's medications.  Please call

## 2018-03-25 DIAGNOSIS — L84 Corns and callosities: Secondary | ICD-10-CM | POA: Diagnosis not present

## 2018-03-25 DIAGNOSIS — Q6689 Other  specified congenital deformities of feet: Secondary | ICD-10-CM | POA: Diagnosis not present

## 2018-03-25 DIAGNOSIS — L602 Onychogryphosis: Secondary | ICD-10-CM | POA: Diagnosis not present

## 2018-03-31 DIAGNOSIS — L578 Other skin changes due to chronic exposure to nonionizing radiation: Secondary | ICD-10-CM | POA: Diagnosis not present

## 2018-04-02 DIAGNOSIS — M810 Age-related osteoporosis without current pathological fracture: Secondary | ICD-10-CM | POA: Diagnosis not present

## 2018-04-08 ENCOUNTER — Non-Acute Institutional Stay: Payer: Medicare Other | Admitting: Internal Medicine

## 2018-04-08 ENCOUNTER — Encounter: Payer: Self-pay | Admitting: Internal Medicine

## 2018-04-08 VITALS — BP 124/70 | HR 72 | Temp 97.7°F | Resp 16 | Ht <= 58 in | Wt 167.4 lb

## 2018-04-08 DIAGNOSIS — I5032 Chronic diastolic (congestive) heart failure: Secondary | ICD-10-CM | POA: Diagnosis not present

## 2018-04-08 DIAGNOSIS — M549 Dorsalgia, unspecified: Secondary | ICD-10-CM | POA: Diagnosis not present

## 2018-04-08 DIAGNOSIS — K219 Gastro-esophageal reflux disease without esophagitis: Secondary | ICD-10-CM | POA: Diagnosis not present

## 2018-04-08 DIAGNOSIS — E7800 Pure hypercholesterolemia, unspecified: Secondary | ICD-10-CM | POA: Diagnosis not present

## 2018-04-08 DIAGNOSIS — E039 Hypothyroidism, unspecified: Secondary | ICD-10-CM

## 2018-04-08 DIAGNOSIS — R5383 Other fatigue: Secondary | ICD-10-CM

## 2018-04-08 DIAGNOSIS — G8929 Other chronic pain: Secondary | ICD-10-CM | POA: Diagnosis not present

## 2018-04-08 DIAGNOSIS — I10 Essential (primary) hypertension: Secondary | ICD-10-CM | POA: Diagnosis not present

## 2018-04-08 DIAGNOSIS — I2510 Atherosclerotic heart disease of native coronary artery without angina pectoris: Secondary | ICD-10-CM

## 2018-04-08 NOTE — Progress Notes (Signed)
Location:  [facility] (12) Provider:  Blanchie Serve MD  Carol Ada, MD  Patient Care Team: Carol Ada, MD as PCP - General (Family Medicine) [name], MD as PCP - Cardiology (Cardiology) [name], MD (Neurosurgery) [name], MD (Neurology) [name], MD as Consulting Physician (Gastroenterology) [name], MD as Consulting Physician (Urology)  Extended Emergency Contact Information Primary Emergency Contact: [facility] Address: [address]          [place], [place] [address] [name] of [place] Phone: [phone] Mobile Phone: [phone] Relation: Spouse Secondary Emergency Contact: [name] Mobile Phone: [phone] Relation: Daughter Preferred language: [name] Interpreter needed? No   Goals of care: Advanced Directive information Advanced Directives 02/05/2018  Does Patient Have a Medical Advance Directive? Yes  Type of Paramedic of [place];Living will  Does patient want to make changes to medical advance directive? No - Patient declined  Copy of [place] in Chart? No - copy requested  Would patient like information on creating a medical advance directive? -  Pre-existing out of facility DNR order (yellow form or pink MOST form) -     Chief Complaint  Patient presents with  . Medical Management of Chronic Issues    follow up for routine health concerns  . Medication Refill    No refills needed at this time.   Marland Kitchen MMSE    27/30. Passed clock drawing     HPI:  Pt is a 84 y.o. female seen today for follow up visit. She is here with her husband. She has been sleeping excessively during daytime. She is able to rest through the night but it gets interrupted. Her memory issue mainly with short term memory continues. MMSE done today with score 27/30 and passed clock draw.   CAD- heart cath 02/05/18 showed mild  nonobstructive single vessel CAD in proximal LAD 30-40%, currently plan is for medical therapy with aspirin, b blocker, zetia and statin. Reviewed cardiology note.   PVD- with chronic leg edema, followed by Dr Oneida Alar. On aspirin and statin.   Hypothyroidism- currently on levothyroxine 50 mcg daily. No recent TSH for review.   Chronic back pain- currently on tramadol 200 mg daily and 50 mg TID prn. Has a pain stimulator to back.   Hyperlipidemia- currently on lovastatin 40 mg daily.   List of providers she sees - Dr Carol Ada from [place] as her PCP prior to establishing care here - cardiology service Dr Radford Pax - nephrology Dr Amalia Hailey - Dr Lenna Gilford from rheumatology for her Sjogren's - neurosurgery  - Dr [facility] neurology service. - eye doctor, foot doctor and her dentist.    Past Medical History:  Diagnosis Date  . Anemia   . Anxiety   . Aortic stenosis    moderate by echo 2019  . Arthritis   . Broken arm    right   . Chronic diastolic CHF (congestive heart failure) ([place])   . Family history of adverse reaction to anesthesia    pt. states sister vomits  . Fibromyalgia   . Fracture of thumb 09/01/2015   right  . Fracture, foot 09/01/2015   right  . GERD (gastroesophageal reflux disease)   . H/O hiatal hernia   . History of bronchitis   . History of kidney stones   . Hypercholesterolemia   . Hypertension    dr t turner  . Hypothyroidism   .  Neuropathy   . Osteoporosis   . Parkinson disease ([place])   . PVD (peripheral vascular disease) (H[facility])    99% stenosis of left anteiror tibial artery, mod stenosis of left distal SFA and popliteal artery followed by [name]  . RBBB    noted on EKG 2018  . Restless leg   . Shortness of breath   . Sjogren's disease ([place])   . SOB (shortness of breath)    chronic due to diastolic dysfunction, deconditioning, obesity  . Tremor    Past Surgical History:  Procedure Laterality Date  . ABDOMINAL HYSTERECTOMY      . BACK SURGERY     4 back surgeries,   lumbar fusion  . CARDIAC CATHETERIZATION  2006   normal  . EYE SURGERY Bilateral    cateracts  . falls     variious fall, broken wrist,and toes  . FRACTURE SURGERY Right April 2016   Wrist, Pt. fell  . RIGHT/LEFT HEART CATH AND CORONARY ANGIOGRAPHY N/A 02/05/2018   Procedure: RIGHT/LEFT HEART CATH AND CORONARY ANGIOGRAPHY;  Surgeon: Troy Sine, MD;  Location: [facility];  Service: Cardiovascular;  Laterality: N/A;  . SPINAL CORD STIMULATOR INSERTION N/A 09/09/2015   Procedure: LUMBAR SPINAL CORD STIMULATOR INSERTION;  Surgeon: Clydell Hakim, MD;  Location: [facility];  Service: Neurosurgery;  Laterality: N/A;  LUMBAR SPINAL CORD STIMULATOR INSERTION  . SPINE SURGERY  April 2013   Back X's 4  . TOTAL HIP ARTHROPLASTY     right  . WRIST FRACTURE SURGERY Bilateral     Allergies  Allergen Reactions  . Sulfa Antibiotics Other (See Comments)    Headache, very sick  . Codeine Other (See Comments)    Reaction:  Headaches and nightmares   . Lactose Intolerance (Gi) Nausea And Vomiting  . Latex Rash  . Lyrica [Pregabalin] Swelling and Other (See Comments)    Reaction:  Leg swelling  . Other Other (See Comments)    Pt states that pain medications give her nightmares.    . Plaquenil [Hydroxychloroquine Sulfate] Other (See Comments)    Reaction:  GI upset   . Reglan [Metoclopramide] Other (See Comments)    Reaction:  GI upset   . Requip [Ropinirole Hcl] Other (See Comments)    Reaction:  GI upset   . Septra [Sulfamethoxazole-Trimethoprim] Nausea And Vomiting  . Shellfish Allergy Nausea And Vomiting    Outpatient Encounter Medications as of 04/08/2018  Medication Sig  . aspirin EC 81 MG tablet Take 81 mg by mouth at bedtime.   . Calcium Carb-Cholecalciferol (CALCIUM 1000 + D) 1000-800 MG-UNIT TABS Take 1 tablet by mouth daily.  . carbidopa-levodopa (SINEMET IR) 25-100 MG tablet Take 2 tablets by mouth 3 (three) times daily.  .  celecoxib (CELEBREX) 200 MG capsule Take 200 mg by mouth 2 (two) times daily.  . Cholecalciferol (VITAMIN D) 2000 units CAPS Take 2,000 Units by mouth daily.  Marland Kitchen denosumab (PROLIA) 60 MG/ML SOLN injection Inject 60 mg into the skin every 6 (six) months. Administer in upper arm, thigh, or abdomen  . dexlansoprazole (DEXILANT) 60 MG capsule Take 60 mg by mouth daily.  . diphenhydramine-acetaminophen (TYLENOL PM) 25-500 MG TABS Take 1 tablet by mouth at bedtime.  . docusate sodium (COLACE) 100 MG capsule Take 100 mg by mouth at bedtime.  . DULoxetine (CYMBALTA) 20 MG capsule Take 20 mg by mouth daily.  Marland Kitchen ezetimibe (ZETIA) 10 MG tablet Take 10 mg by mouth at bedtime.   Marland Kitchen  hydroxychloroquine (PLAQUENIL) 200 MG tablet Take 200 mg by mouth daily.   Marland Kitchen levothyroxine (SYNTHROID, LEVOTHROID) 50 MCG tablet Take 50 mcg by mouth daily before breakfast.   . lovastatin (MEVACOR) 40 MG tablet Take 40 mg by mouth at bedtime.  . metoprolol tartrate (LOPRESSOR) 25 MG tablet TAKE 1 TABLET ONCE DAILY.  . Multiple Vitamin (MULTIVITAMIN WITH MINERALS) TABS tablet Take 1 tablet by mouth daily.  Vladimir Faster Glycol-Propyl Glycol (SYSTANE) 0.4-0.3 % GEL ophthalmic gel Place 1 application into both eyes at bedtime.  Vladimir Faster Glycol-Propyl Glycol (SYSTANE) 0.4-0.3 % SOLN Place 1 drop into both eyes daily.   . pramipexole (MIRAPEX) 1 MG tablet Take 1 tablet (1 mg total) by mouth 3 (three) times daily.  . Skin Protectants, Misc. (EUCERIN) cream Apply 1 application topically daily.   . traMADol (ULTRAM) 50 MG tablet Take 50 mg by mouth 3 (three) times daily as needed.  . traMADol (ULTRAM-ER) 200 MG 24 hr tablet Take 200 mg by mouth daily.   Marland Kitchen trimethoprim (TRIMPEX) 100 MG tablet Take 100 mg by mouth daily.  Marland Kitchen lidocaine (XYLOCAINE) 5 % ointment Apply 2-3 application topically 4 (four) times daily. Apply to affected area/feet (Patient not taking: Reported on 04/08/2018)   No facility-administered encounter medications on file as  of 04/08/2018.     Review of Systems  Constitutional: Positive for fatigue. Negative for appetite change, chills and fever.  HENT: Positive for hearing loss, rhinorrhea, sinus pressure and trouble swallowing. Negative for congestion, ear discharge, ear pain, mouth sores, postnasal drip, sinus pain and sore throat.        Trouble swallowing pills. No trouble swallowing food as long she is chewing it properly, takes small bites. Solid hard food and tough meat can cause trouble. Dry bread can cause trouble at times  Eyes: Positive for visual disturbance. Negative for discharge.  Respiratory: Positive for shortness of breath. Negative for cough.        Has dyspnea with movement that gets relieved with rest, chronic dyspnea but no recent worsening  Cardiovascular: Negative for chest pain and palpitations.  Gastrointestinal: Positive for constipation. Negative for abdominal pain, blood in stool, diarrhea, nausea and vomiting.       Reflux medication helps. Has history of hemorrhoids.   Endocrine: Negative for cold intolerance.  Genitourinary: Negative for dysuria, flank pain, frequency and hematuria.       Wakes up once at night to urinate. Has had history of UTIs. Has history of kidney stones. Followed by nephrology. She thinks she recently passed a stone while urinating and it preceded with burning and pain with urination and low back pain. The symptoms have since then resolved.   Musculoskeletal: Positive for back pain. Negative for gait problem.       No fall reported, chronic back pain. Uses walker for ambulation  Skin: Negative for rash.  Neurological: Positive for dizziness. Negative for numbness and headaches.  Psychiatric/Behavioral: Positive for confusion.    Immunization History  Administered Date(s) Administered  . Influenza Split 09/09/2013  . Influenza,inj,Quad PF,6+ Mos 09/11/2016  . Tdap 09/01/2015   Pertinent  Health Maintenance Due  Topic Date Due  . PNA vac Low Risk Adult  (1 of 2 - PCV13) 10/01/1998  . INFLUENZA VACCINE  07/10/2018  . DEXA SCAN  Completed   Fall Risk  11/15/2017 02/13/2017 10/13/2015 03/02/2015  Falls in the past year? No Yes Yes Yes  Number falls in past yr: - 2 or more 1 1  Injury  with Fall? - Yes Yes Yes  Comment - stitches in forehead, fx finger r foot fx, thumb fx -  Risk for fall due to : - Impaired mobility;Impaired balance/gait (No Data) -  Risk for fall due to: Comment - - unsure of reason for fall -   Functional Status Survey:    Vitals:   04/08/18 1027  BP: 124/70  Pulse: 72  Resp: 16  Temp: 97.7 F (36.5 C)  TempSrc: Oral  SpO2: 98%  Weight: 167 lb 6.4 oz (75.9 kg)  Height: '4\' 10"'  (1.473 m)   Body mass index is 34.99 kg/m.   Wt Readings from Last 3 Encounters:  04/08/18 167 lb 6.4 oz (75.9 kg)  02/13/18 161 lb 1.9 oz (73.1 kg)  02/05/18 156 lb (70.8 kg)   Physical Exam  Constitutional: No distress.  Obese elderly female, in no acute distress.   HENT:  Head: Normocephalic and atraumatic.  Nose: Nose normal.  Mouth/Throat: Oropharynx is clear and moist. No oropharyngeal exudate.  Eyes: Pupils are equal, round, and reactive to light. Conjunctivae and EOM are normal. Right eye exhibits no discharge. Left eye exhibits no discharge. No scleral icterus.  Neck: Neck supple.  Cardiovascular: Normal rate and regular rhythm.  Pulmonary/Chest: Effort normal and breath sounds normal. She has no wheezes. She has no rales.  Abdominal: Soft. Bowel sounds are normal. There is no tenderness. There is no guarding.  Musculoskeletal: She exhibits deformity. She exhibits no edema or tenderness.  Able to move all 4 extremities, unsteady gait, has walker for ambulation  Lymphadenopathy:    She has no cervical adenopathy.  Neurological: She is alert.  Oriented to person and place but not to time, has short term memory loss  Skin: Skin is warm and dry. No rash noted. She is not diaphoretic.  Psychiatric: She has a normal mood and  affect.    Labs reviewed: Recent Labs    01/24/18 1059 02/03/18 1220  NA 139 143  K 4.6 4.5  CL 105 106  CO2 20 22  GLUCOSE 96 95  BUN 20 20  CREATININE 0.89 0.77  CALCIUM 9.7 9.9   Recent Labs    01/31/18 0821  AST 32  ALT 7  ALKPHOS 56  BILITOT 0.4  PROT 6.1  ALBUMIN 4.2   Recent Labs    02/03/18 1220  WBC 7.6  HGB 10.6*  HCT 31.9*  MCV 89  PLT 330   No results found for: TSH No results found for: HGBA1C Lab Results  Component Value Date   CHOL 155 01/31/2018   HDL 61 01/31/2018   LDLCALC 55 01/31/2018   TRIG 196 (H) 01/31/2018   CHOLHDL 2.5 01/31/2018   No results found for: TSH  Significant Diagnostic Results in last 30 days:  No results found.  Assessment/Plan  1. Fatigue, unspecified type Check TSH to assess her thyroid function. Her being on narcotic, cymbalta and sinemet could be contributing to some of her excessive day time sleepiness.  - TSH; Future  2. Acquired hypothyroidism Continue levothyroxine - TSH; Future - CMP with eGFR([facility]); Future - Lipid Panel; Future - CBC (no diff); Future - TSH; Future  3. Essential hypertension Continue lopressor, monitor BP - CMP with eGFR([facility]); Future  4. Chronic diastolic CHF (congestive heart failure) ([facility]) Stable, continue b blocker. - CMP with eGFR([facility]); Future - Lipid Panel; Future  5. Gastroesophageal reflux disease, esophagitis presence not specified Continue PPI  6. Chronic back pain, unspecified back location, unspecified back pain  laterality Continue current regimen of tramadol  - CMP with eGFR([facility]); Future  7. Hypercholesterolemia Continue statin current regimen and zetia - CMP with eGFR([facility]); Future - Lipid Panel; Future   Family/ staff Communication: reviewed care plan with patient and her husband  Labs/tests ordered:   Lab Orders     TSH     CMP with eGFR([facility])     Lipid Panel     CBC (no diff)     TSH   Blanchie Serve, MD Internal Medicine [facility] [place], [place] [address] Cell Phone (Monday-Friday 8 am - 5 pm): [phone] On Call: [phone] and follow prompts after 5 pm and on weekends Office Phone: [phone] Office Fax: [phone]

## 2018-04-14 DIAGNOSIS — L578 Other skin changes due to chronic exposure to nonionizing radiation: Secondary | ICD-10-CM | POA: Diagnosis not present

## 2018-04-14 DIAGNOSIS — M154 Erosive (osteo)arthritis: Secondary | ICD-10-CM | POA: Diagnosis not present

## 2018-04-14 DIAGNOSIS — E669 Obesity, unspecified: Secondary | ICD-10-CM | POA: Diagnosis not present

## 2018-04-14 DIAGNOSIS — M3500 Sicca syndrome, unspecified: Secondary | ICD-10-CM | POA: Diagnosis not present

## 2018-04-14 DIAGNOSIS — Z6833 Body mass index (BMI) 33.0-33.9, adult: Secondary | ICD-10-CM | POA: Diagnosis not present

## 2018-04-14 DIAGNOSIS — D485 Neoplasm of uncertain behavior of skin: Secondary | ICD-10-CM | POA: Diagnosis not present

## 2018-04-14 DIAGNOSIS — B079 Viral wart, unspecified: Secondary | ICD-10-CM | POA: Diagnosis not present

## 2018-04-14 DIAGNOSIS — M2550 Pain in unspecified joint: Secondary | ICD-10-CM | POA: Diagnosis not present

## 2018-04-14 DIAGNOSIS — Z79899 Other long term (current) drug therapy: Secondary | ICD-10-CM | POA: Diagnosis not present

## 2018-04-14 DIAGNOSIS — M545 Low back pain: Secondary | ICD-10-CM | POA: Diagnosis not present

## 2018-04-15 DIAGNOSIS — R5383 Other fatigue: Secondary | ICD-10-CM | POA: Diagnosis not present

## 2018-04-15 DIAGNOSIS — E039 Hypothyroidism, unspecified: Secondary | ICD-10-CM | POA: Diagnosis not present

## 2018-04-15 LAB — TSH: TSH: 1.28 m[IU]/L (ref 0.40–4.50)

## 2018-04-18 NOTE — Progress Notes (Signed)
[facility]  PATIENT: [name] DOB: 07-Feb-1931   REASON FOR VISIT: Follow-up for Parkinson's disease mild memory loss HISTORY FROM: Patient has been    HISTORY OF PRESENT ILLNESS:UPDATE 04/21/2018 CM [name], 84 year old female returns for follow-up with a history of Parkinson's disease and mild memory loss.  She reports she has difficulty standing to walk some time and sometimes her legs do not want to go she shuffle.  She has not had any physical therapy in quite some time she reports.  She ambulates with a rolling walker.  She denies any falls she denies side effects to her Sinemet and Mirapex.  Memory score 26 out of 30. ADLs 6 pt is independent IADLs 4 low function.  She no longer drives.  She returns for reevaluation   UPDATE (11/15/17, [facility]P): Since last visit, doing about the same. Tolerating meds. No alleviating or aggravating factors. Memory and gait continue to be issues. Planning to go to [place] next spring.   UPDATE 02/13/17: Since last visit, feels stable. Tolerating meds. Some memory issues, esp when tired.   UPDATE 08/17/16: Since last visit, continues with intermittent shaking sens on "inside". Sometimes forgets the mid day dose of carb/levo.  UPDATE 02/16/16: Since last visit, doing well. Memory loss stable. Having some pain in feet. Now has spinal cord stim for back pain issues.   UPDATE 10/13/15: Since last visit, more memory loss problems. Forgetting order when playing bridge. Forgetting recent conversations and names. Had fall in Aug 2016, with right foot fracture.   UPDATE 07/06/15: More issues with kidney stones, UTIs, shoulder pain. No wearing off. No tremor. More balance and freezing issues. Short term memory problems continue. Patient is in denial about need to use walker and her own memory problems.   UPDATE 03/02/15: Since last visit, patient's balance has worsened. She fell recently (02/26/15), with right non-displaced distal right radial  fracture. Notes more tremor and wearing off around 4pm everyday. Currently on carb/levo 1.5tab TID and pramipexole 1mg  TID. Sometimes takes a 4th dose of carb/levo 1.5tabs. Had been using a walker, mainly at night time, but did not use at time of fall.  UPDATE 09/03/14: Since last visit, tremor is well controlled. Gait and freezing are stable,. Doing well on (carb/levo 1.5 tabs + pramipexole 1mg ) taken TID (sometimes QID, with extra late evening dose if wearing off is notable). Having more foot pain, numbness, drooling prob, flushing, anxiety. Saw psychology last visit, then rec to see psychiatry, but sxs improved and the psychiatrist who was recommended to them was not taking new patients.   UPDATE 02/24/14: Since last visit, has increased carb/levo up to 1 tab 4x per day; has also incr pramipexole up to 1mg  4x per day (for convenience of remembering, even though we normally dose BID or TID). Also with more anxiety, decr confidence, more tremor in right upper ext. Also reports some right shoulder, bilateral knee, low back pain. Has noticed some freezing of gait. Has appt with psychology later today.  UPDATE 08/26/13: Since last visit, noticing some wearing off in early evening. Takes carb/levo 6:30am, noon, 9pm. Also getting more "worked up" lately, reading about PD online. No falls. Uses cane and walker. More fatigue and mental exhaustion.  UPDATE 02/04/13: Since last visit, has had some minor falls. tremor and PD sxs better on carb/levo. No anxiety. Some drooling and hypophonia. Not using walker or cane all the time. constipation stable.  UPDATE 10/06/12: Since last visit, had another back surgery (april  2013), dx'd with CHF, dx'd with sjogrens, also with fall in August 2013 with pelvic fracture. Reports good tremor control. Some freezing of gait.  UPDATE 02/13/12: Now on pramipexole 1mg  QID (7am, noon, 5pm, 10pm). Some wearing off around 5pm. Also, had rxn to forteo, now with right leg pain. Also  getting lumbar radiculopathy eval per ortho.  UPDATE 08/14/11: Tremor developing around 4pm now.  More anxiety per husband.  Also with UTI x 2.  Now dx'd with left foot fractures (? "stubbed" toes; no falls).  UPDATE 04/27/11: Tolerating pramipexole 1mg  TID (7am, 1-2pm, 7p).  some wearing off right at 7p.  feels unsteady, and has ongoing back problems and pain.  UPDATE 01/24/11: Doing slightly better with pramipexole 1mg  BID.  Tremor returns later in the day before evening dose.  Still with gait and balance diff.    PRIOR HPI: 84 year old female with history of hypertension, hypercholesterolemia, hypothyroidism, fibromyalgia, restless leg syndrome, presenting for evaluation of tremor for the past 3 years. She is here with her husband. Approximately 3 years ago patient developed intermittent tremor in her left hand particularly in her thumb and fingers.  Tremor is most noticeable at rest and in the evening.  In June 2011, patient tripped, fell and broke her left wrist and forearm.  This was repaired surgically and in a cast; during this time and since then her tremor has improved. Patient also has history of restless leg syndrome for the past 5 years. She's been on pramipexole for number of years (0.5mg  qhs) and recently increased to 1 mg qhs a few weeks ago.  The patient's mother also had restless leg syndrome. Patient has a diagnosis of iron deficiency anemia but is not on iron replacement.     REVIEW OF SYSTEMS: Full 14 system review of systems performed and notable only for those listed, all others are neg:  Constitutional: neg  Cardiovascular: neg Ear/Nose/Throat: Hearing loss Skin: neg Eyes: Light sensitivity Respiratory: neg Gastroitestinal: Constipation,  Hematology/Lymphatic: neg  Endocrine: neg Musculoskeletal: Walking difficulty Allergy/Immunology: neg Neurological: Memory loss Psychiatric: Depression anxiety Sleep : neg   ALLERGIES: Allergies  Allergen Reactions  . Sulfa  Antibiotics Other (See Comments)    Headache, very sick  . Codeine Other (See Comments)    Reaction:  Headaches and nightmares   . Lactose Intolerance (Gi) Nausea And Vomiting  . Latex Rash  . Lyrica [Pregabalin] Swelling and Other (See Comments)    Reaction:  Leg swelling  . Other Other (See Comments)    Pt states that pain medications give her nightmares.    . Plaquenil [Hydroxychloroquine Sulfate] Other (See Comments)    Reaction:  GI upset   . Reglan [Metoclopramide] Other (See Comments)    Reaction:  GI upset   . Requip [Ropinirole Hcl] Other (See Comments)    Reaction:  GI upset   . Septra [Sulfamethoxazole-Trimethoprim] Nausea And Vomiting  . Shellfish Allergy Nausea And Vomiting    HOME MEDICATIONS: Outpatient Medications Prior to Visit  Medication Sig Dispense Refill  . aspirin EC 81 MG tablet Take 81 mg by mouth at bedtime.     . Calcium Carb-Cholecalciferol (CALCIUM 1000 + D) 1000-800 MG-UNIT TABS Take 1 tablet by mouth daily.    . carbidopa-levodopa (SINEMET IR) 25-100 MG tablet Take 2 tablets by mouth 3 (three) times daily. 540 tablet 4  . celecoxib (CELEBREX) 200 MG capsule Take 200 mg by mouth 2 (two) times daily.    . Cholecalciferol (VITAMIN D) 2000 units CAPS Take  2,000 Units by mouth daily.    Marland Kitchen denosumab (PROLIA) 60 MG/ML SOLN injection Inject 60 mg into the skin every 6 (six) months. Administer in upper arm, thigh, or abdomen    . dexlansoprazole (DEXILANT) 60 MG capsule Take 60 mg by mouth daily.    . diphenhydramine-acetaminophen (TYLENOL PM) 25-500 MG TABS Take 1 tablet by mouth at bedtime.    . docusate sodium (COLACE) 100 MG capsule Take 100 mg by mouth at bedtime.    . DULoxetine (CYMBALTA) 20 MG capsule Take 20 mg by mouth daily.    Marland Kitchen ezetimibe (ZETIA) 10 MG tablet Take 10 mg by mouth at bedtime.     . hydroxychloroquine (PLAQUENIL) 200 MG tablet Take 200 mg by mouth daily.   0  . levothyroxine (SYNTHROID, LEVOTHROID) 50 MCG tablet Take 50 mcg by mouth  daily before breakfast.     . lidocaine (XYLOCAINE) 5 % ointment Apply 2-3 application topically 4 (four) times daily. Apply to affected area/feet 35.44 g 12  . lovastatin (MEVACOR) 40 MG tablet Take 40 mg by mouth at bedtime.    . metoprolol tartrate (LOPRESSOR) 25 MG tablet TAKE 1 TABLET ONCE DAILY. 90 tablet 2  . Multiple Vitamin (MULTIVITAMIN WITH MINERALS) TABS tablet Take 1 tablet by mouth daily.    Vladimir Faster Glycol-Propyl Glycol (SYSTANE) 0.4-0.3 % GEL ophthalmic gel Place 1 application into both eyes at bedtime.    Vladimir Faster Glycol-Propyl Glycol (SYSTANE) 0.4-0.3 % SOLN Place 1 drop into both eyes daily.     . pramipexole (MIRAPEX) 1 MG tablet Take 1 tablet (1 mg total) by mouth 3 (three) times daily. 270 tablet 4  . Skin Protectants, Misc. (EUCERIN) cream Apply 1 application topically daily.     . traMADol (ULTRAM) 50 MG tablet Take 50 mg by mouth 3 (three) times daily as needed.    . traMADol (ULTRAM-ER) 200 MG 24 hr tablet Take 200 mg by mouth daily.     Marland Kitchen trimethoprim (TRIMPEX) 100 MG tablet Take 100 mg by mouth daily.     No facility-administered medications prior to visit.     PAST MEDICAL HISTORY: Past Medical History:  Diagnosis Date  . Anemia   . Anxiety   . Aortic stenosis    moderate by echo 2019  . Arthritis   . Broken arm    right   . Chronic diastolic CHF (congestive heart failure) ([facility])   . Family history of adverse reaction to anesthesia    pt. states sister vomits  . Fibromyalgia   . Fracture of thumb 09/01/2015   right  . Fracture, foot 09/01/2015   right  . GERD (gastroesophageal reflux disease)   . H/O hiatal hernia   . History of bronchitis   . History of kidney stones   . Hypercholesterolemia   . Hypertension    dr t turner  . Hypothyroidism   . Neuropathy   . Osteoporosis   . Parkinson disease ([facility])   . PVD (peripheral vascular disease) (HCC)    99% stenosis of left anteiror tibial artery, mod stenosis of left distal SFA and popliteal  artery followed by [name]  . RBBB    noted on EKG 2018  . Restless leg   . Shortness of breath   . Sjogren's disease ([place])   . SOB (shortness of breath)    chronic due to diastolic dysfunction, deconditioning, obesity  . Tremor     PAST SURGICAL HISTORY: Past Surgical History:  Procedure Laterality Date  .  ABDOMINAL HYSTERECTOMY    . BACK SURGERY     4 back surgeries,   lumbar fusion  . CARDIAC CATHETERIZATION  2006   normal  . EYE SURGERY Bilateral    cateracts  . falls     variious fall, broken wrist,and toes  . FRACTURE SURGERY Right April 2016   Wrist, Pt. fell  . RIGHT/LEFT HEART CATH AND CORONARY ANGIOGRAPHY N/A 02/05/2018   Procedure: RIGHT/LEFT HEART CATH AND CORONARY ANGIOGRAPHY;  Surgeon: Troy Sine, MD;  Location: [facility];  Service: Cardiovascular;  Laterality: N/A;  . SPINAL CORD STIMULATOR INSERTION N/A 09/09/2015   Procedure: LUMBAR SPINAL CORD STIMULATOR INSERTION;  Surgeon: Clydell Hakim, MD;  Location: [facility];  Service: Neurosurgery;  Laterality: N/A;  LUMBAR SPINAL CORD STIMULATOR INSERTION  . SPINE SURGERY  April 2013   Back X's 4  . TOTAL HIP ARTHROPLASTY     right  . WRIST FRACTURE SURGERY Bilateral     FAMILY HISTORY: Family History  Problem Relation Age of Onset  . Heart attack Mother   . Restless legs syndrome Mother   . Heart failure Mother   . Heart disease Mother   . Hypertension Mother   . COPD Father     SOCIAL HISTORY: Social History   Socioeconomic History  . Marital status: Married    Spouse name: [name]  . Number of children: 2  . Years of education: HS  . Highest education level: Not on file  Occupational History    Comment: Homemaker  Social Needs  . Financial resource strain: Not on file  . Food insecurity:    Worry: Not on file    Inability: Not on file  . Transportation needs:    Medical: Not on file    Non-medical: Not on file  Tobacco Use  . Smoking status: Never Smoker  .  Smokeless tobacco: Never Used  Substance and Sexual Activity  . Alcohol use: No    Alcohol/week: 0.0 oz  . Drug use: No  . Sexual activity: Never  Lifestyle  . Physical activity:    Days per week: Not on file    Minutes per session: Not on file  . Stress: Not on file  Relationships  . Social connections:    Talks on phone: Not on file    Gets together: Not on file    Attends religious service: Not on file    Active member of club or organization: Not on file    Attends meetings of clubs or organizations: Not on file    Relationship status: Not on file  . Intimate partner violence:    Fear of current or ex partner: Not on file    Emotionally abused: Not on file    Physically abused: Not on file    Forced sexual activity: Not on file  Other Topics Concern  . Not on file  Social History Narrative   Patient lives at home with her spouse. Married in 1957. Two people lives in the home, no pets.    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    Caffeine Use: tea     PHYSICAL EXAM  Vitals:   04/21/18 0804  BP: 135/70  Pulse: 92  Weight: 162 lb 9.6 oz (73.8 kg)  Height: 5\' 2"  (1.575 m)   Body mass index is 29.74 kg/m.  Generalized: Well developed, obese female in  no acute distress , well-groomed,  mild masking of the face Head: normocephalic and atraumatic,. Oropharynx benign  Neck: Supple,  Cardiac: Regular rate rhythm, no murmur  Musculoskeletal: No deformity   Neurological examination   Mentation: Alert. AFT 13. Clock drawing 4/4 MMSE - Mini Mental State Exam 04/21/2018 04/08/2018 10/13/2015  Not completed: - (No Data) -  Orientation to time 3 4 5   Orientation to Place 5 5 4   Registration 3 3 3   Attention/ Calculation 5 5 4   Recall 2 1 2   Language- name 2 objects 2 2 2   Language- repeat 1 1 0  Language- follow 3 step command 3 3 3   Language- read & follow direction 1 1 1   Write a sentence 1 1 1   Copy design 0 1 0    Total score 26 27 25    Follows all commands speech and language fluent.   Cranial nerve II-XII: Pupils were equal round reactive to light extraocular movements were full, visual field were full on confrontational test. Facial sensation and strength were normal. hearing was intact to finger rubbing bilaterally. Uvula tongue midline. head turning and shoulder shrug were normal and symmetric.Tongue protrusion into cheek strength was normal. Motor: normal bulk and tone, 4/5 strength in  the upper and lower extremity, mild bradykinesia in both upper extremitiesy, no resting  or postural tremor seen. Sensory: normal and symmetric to light touch,  Coordination: finger-nose-finger, heel-to-shin bilaterally, slow movement overall  Reflexes: Brachioradialis 2/2, biceps 2/2, triceps 2/2, patellar absent , Achilles absent,  plantar responses were flexor bilaterally. Gait and Station: Rising up from seated position with push off, comes with any gait, ambulates with rolling walker, some freezing spells noted  DIAGNOSTIC DATA (LABS, IMAGING, TESTING) - I reviewed patient records, labs, notes, testing and imaging myself where available.  Lab Results  Component Value Date   WBC 7.6 02/03/2018   HGB 10.6 (L) 02/03/2018   HCT 31.9 (L) 02/03/2018   MCV 89 02/03/2018   PLT 330 02/03/2018      Component Value Date/Time   NA 143 02/03/2018 1220   K 4.5 02/03/2018 1220   CL 106 02/03/2018 1220   CO2 22 02/03/2018 1220   GLUCOSE 95 02/03/2018 1220   GLUCOSE 109 (H) 01/13/2017 1733   BUN 20 02/03/2018 1220   CREATININE 0.77 02/03/2018 1220   CREATININE 0.87 05/03/2016 0926   CALCIUM 9.9 02/03/2018 1220   PROT 6.1 01/31/2018 0821   ALBUMIN 4.2 01/31/2018 0821   AST 32 01/31/2018 0821   ALT 7 01/31/2018 0821   ALKPHOS 56 01/31/2018 0821   BILITOT 0.4 01/31/2018 0821   GFRNONAA 71 02/03/2018 1220   GFRAA 82 02/03/2018 1220   Lab Results  Component Value Date   CHOL 155 01/31/2018   HDL 61  01/31/2018   LDLCALC 55 01/31/2018   TRIG 196 (H) 01/31/2018   CHOLHDL 2.5 01/31/2018    Lab Results  Component Value Date   TSH 1.28 04/15/2018      ASSESSMENT AND PLAN 84 y.o. female with hypertension, hypercholesterolemia, hypothyroidism, fibromyalgia, restless leg syndrome and parkinson's disease.  Continues with shoulder pain, foot pain, memory loss and gait difficulty, likely related to progression of parkinson's disease.Neuropathy due to Sjogren's syndrome (HCC)   PLAN: Continue pramipexole 1mg  three time per day Continue carb/levo to 2 tabs three times per day Continue using rolling walker at all times;  Will order PT eval and treat for gait abn  stay active and continue stimulating activities for memory Memory score is stable 26/30 F/U  in 6 months Dennie Bible, [facility], [facility], APRN  [facility] [address] [place], [place] [address] [phone]

## 2018-04-21 ENCOUNTER — Ambulatory Visit (INDEPENDENT_AMBULATORY_CARE_PROVIDER_SITE_OTHER): Payer: Medicare Other | Admitting: Nurse Practitioner

## 2018-04-21 ENCOUNTER — Encounter: Payer: Self-pay | Admitting: Nurse Practitioner

## 2018-04-21 VITALS — BP 135/70 | HR 92 | Ht 62.0 in | Wt 162.6 lb

## 2018-04-21 DIAGNOSIS — R269 Unspecified abnormalities of gait and mobility: Secondary | ICD-10-CM | POA: Diagnosis not present

## 2018-04-21 DIAGNOSIS — G20A1 Parkinson's disease without dyskinesia, without mention of fluctuations: Secondary | ICD-10-CM | POA: Insufficient documentation

## 2018-04-21 DIAGNOSIS — R2681 Unsteadiness on feet: Secondary | ICD-10-CM

## 2018-04-21 DIAGNOSIS — R413 Other amnesia: Secondary | ICD-10-CM

## 2018-04-21 DIAGNOSIS — G20 Parkinson's disease: Secondary | ICD-10-CM

## 2018-04-21 DIAGNOSIS — I2510 Atherosclerotic heart disease of native coronary artery without angina pectoris: Secondary | ICD-10-CM

## 2018-04-21 HISTORY — DX: Unsteadiness on feet: R26.81

## 2018-04-21 HISTORY — DX: Other amnesia: R41.3

## 2018-04-21 NOTE — Patient Instructions (Signed)
continue pramipexole 1mg  three time per day - continue carb/levo to 2 tabs three times per day - continue using rolling walker at all times;  Will order PT eval and treat stay active and continue stimulating activities Memory score is stable  F/U in 6 months

## 2018-04-22 NOTE — Progress Notes (Signed)
I reviewed note and agree with plan.   [name], MD 6/72/5500, 1:64 PM Certified in Neurology, Neurophysiology and Neuroimaging  [facility] [address], [place], [place] [address] [phone]

## 2018-04-24 DIAGNOSIS — Z79899 Other long term (current) drug therapy: Secondary | ICD-10-CM | POA: Diagnosis not present

## 2018-05-06 ENCOUNTER — Telehealth: Payer: Self-pay | Admitting: Nurse Practitioner

## 2018-05-06 NOTE — Telephone Encounter (Signed)
Pt's husband called he has not heard from PT.  He said he walking is "dreadful" and he would prefer in home PT if that would be possible with her insurance. Please call to advise

## 2018-05-06 NOTE — Telephone Encounter (Signed)
LVM for husband advising him that the referral was sent and gave him number [facility])  to call and check on status. Advised him that patient may need to be evaluated first before insurance would agree to pay for in home PT. Advised he call back for further questions if needed.

## 2018-05-08 ENCOUNTER — Ambulatory Visit: Payer: Medicare Other | Attending: Nurse Practitioner | Admitting: Physical Therapy

## 2018-05-08 ENCOUNTER — Encounter: Payer: Self-pay | Admitting: Physical Therapy

## 2018-05-08 DIAGNOSIS — M6281 Muscle weakness (generalized): Secondary | ICD-10-CM | POA: Insufficient documentation

## 2018-05-08 DIAGNOSIS — R2681 Unsteadiness on feet: Secondary | ICD-10-CM | POA: Insufficient documentation

## 2018-05-08 DIAGNOSIS — R2689 Other abnormalities of gait and mobility: Secondary | ICD-10-CM | POA: Diagnosis not present

## 2018-05-08 NOTE — Therapy (Signed)
[facility] [address] [place], [place], [address] Phone: [phone]   Fax:  [phone]  Physical Therapy Evaluation  Patient Details  Name: [name] MRN: [id number] Date of Birth: [birth year] Referring Provider: Dennie Bible, NP   Encounter Date: 05/08/2081  PT End of Session - 05/08/18 1204    Visit Number  1    Number of Visits  9    Date for PT Re-Evaluation  07/07/18    Authorization Type  Medicare, BCBS supplement    PT Start Time  0849    PT Stop Time  0934    PT Time Calculation (min)  45 min    Activity Tolerance  Patient tolerated treatment well    Behavior During Therapy  [facility] for tasks assessed/performed       Past Medical History:  Diagnosis Date  . Anemia   . Anxiety   . Aortic stenosis    moderate by echo 2019  . Arthritis   . Broken arm    right   . Chronic diastolic CHF (congestive heart failure) ([place])   . Family history of adverse reaction to anesthesia    pt. states sister vomits  . Fibromyalgia   . Fracture of thumb 09/01/2015   right  . Fracture, foot 09/01/2015   right  . GERD (gastroesophageal reflux disease)   . H/O hiatal hernia   . History of bronchitis   . History of kidney stones   . Hypercholesterolemia   . Hypertension    dr t turner  . Hypothyroidism   . Neuropathy   . Osteoporosis   . Parkinson disease ([place])   . PVD (peripheral vascular disease) ([facility])    99% stenosis of left anteiror tibial artery, mod stenosis of left distal SFA and popliteal artery followed by [name]  . RBBB    noted on EKG 2018  . Restless leg   . Shortness of breath   . Sjogren's disease ([place])   . SOB (shortness of breath)    chronic due to diastolic dysfunction, deconditioning, obesity  . Tremor     Past Surgical History:  Procedure Laterality Date  . ABDOMINAL HYSTERECTOMY    . BACK SURGERY     4 back surgeries,   lumbar fusion  . CARDIAC CATHETERIZATION  2006    normal  . EYE SURGERY Bilateral    cateracts  . falls     variious fall, broken wrist,and toes  . FRACTURE SURGERY Right April 2016   Wrist, Pt. fell  . RIGHT/LEFT HEART CATH AND CORONARY ANGIOGRAPHY N/A 02/05/2018   Procedure: RIGHT/LEFT HEART CATH AND CORONARY ANGIOGRAPHY;  Surgeon: Troy Sine, MD;  Location: [facility];  Service: Cardiovascular;  Laterality: N/A;  . SPINAL CORD STIMULATOR INSERTION N/A 09/09/2015   Procedure: LUMBAR SPINAL CORD STIMULATOR INSERTION;  Surgeon: Clydell Hakim, MD;  Location: [facility];  Service: Neurosurgery;  Laterality: N/A;  LUMBAR SPINAL CORD STIMULATOR INSERTION  . SPINE SURGERY  April 2013   Back X's 4  . TOTAL HIP ARTHROPLASTY     right  . WRIST FRACTURE SURGERY Bilateral     There were no vitals filed for this visit.   Subjective Assessment - 05/08/18 0855    Subjective  Pt's husband reports walking is more difficult, more difficult to get going with feet.  My feet stick sometimes with gait.  Knees ache when I walk.  Pt reports no falls in past 6 months.  Pt uses 3-wheeled RW and rollator with seat to go to dining room at [facility].    Patient is accompained by:  Family member husband, Saralyn Pilar    Pertinent History  Parkinson's disease (since 2012), memory deficits, PVD, chronic back pain, arthritis, hx of fractures due to falls; osteoporosis    Patient Stated Goals  Pt's goals for therapy are to get up and down better and help with mobility.    Currently in Pain?  Yes    Pain Score  5     Pain Location  Finger (Comment which one) all fingers    Pain Orientation  Right;Left    Pain Descriptors / Indicators  Aching    Pain Onset  More than a month ago    Pain Frequency  Intermittent    Aggravating Factors   arthritis    Pain Relieving Factors  movement helps    Multiple Pain Sites  Yes    Pain Score  6    Pain Location  Knee    Pain Orientation  Left;Right    Pain Descriptors / Indicators  Aching    Pain Type  Chronic  pain    Pain Onset  More than a month ago    Pain Frequency  Intermittent    Aggravating Factors   walking    Pain Relieving Factors  sitting         [facility] PT Assessment - 05/08/18 0901      Assessment   Medical Diagnosis  Parkinson's disease, gait abnormality    Referring Provider  Dennie Bible, NP    Onset Date/Surgical Date  04/21/18 MD visit      Precautions   Precautions  Fall      Balance Screen   Has the patient fallen in the past 6 months  No    Has the patient had a decrease in activity level because of a fear of falling?   Yes    Is the patient reluctant to leave their home because of a fear of falling?   No      Home Environment   Living Environment  Assisted living Independent living at [place] bedroom apt    [place] - 4 wheels;Electric scooter;Other (comment) 3-wheeled walker    Additional Comments  Walks down to dining room for lunch; husband typically brings back dinner to apartment; apt to dining room is about 300-400 yards.  Has automatic doors to dining hall, and this is where she experiences "panic" or freezing episodes      Prior Function   Level of Independence  Independent    Leisure  Walks to dining room with husband; does stretching class once per week.      Observation/Other Assessments   Focus on Therapeutic Outcomes (FOTO)   NA      Posture/Postural Control   Posture/Postural Control  Postural limitations    Postural Limitations  Forward head;Rounded Shoulders;Increased thoracic kyphosis      ROM / Strength   AROM / PROM / Strength  Strength      Strength   Overall Strength  Deficits    Overall Strength Comments  Grossly tested 4+/5 RLE, 4/5 LLE      Transfers   Transfers  Sit to Stand;Stand to Sit    Sit to Stand  5: Supervision;With upper extremity assist;From chair/3-in-1    Five time sit to stand comments   Did not attempt due to pt needing extra time and UE  support    Stand to Sit  5: Supervision;With  upper extremity assist;To chair/3-in-1    Comments  8 sec for initial sit>stand      Ambulation/Gait   Ambulation/Gait  Yes    Ambulation/Gait Assistance  5: Supervision;4: Min guard    Ambulation Distance (Feet)  100 Feet    Assistive device  -- 3-wheeled RW    Gait Pattern  Step-through pattern;Decreased step length - left;Trunk flexed;Poor foot clearance - left;Poor foot clearance - right;Right foot flat;Left foot flat    Ambulation Surface  Level;Indoor    Gait velocity  21.93 sec = 1.5 ft/sec      Standardized Balance Assessment   Standardized Balance Assessment  Timed Up and Go Test;Berg Balance Test      Berg Balance Test   Sit to Stand  Able to stand using hands after several tries    Standing Unsupported  Able to stand 2 minutes with supervision    Sitting with Back Unsupported but Feet Supported on Floor or Stool  Able to sit safely and securely 2 minutes    Stand to Sit  Controls descent by using hands    Transfers  Able to transfer with verbal cueing and /or supervision    Standing Unsupported with Eyes Closed  Able to stand 10 seconds with supervision    Standing Ubsupported with Feet Together  Needs help to attain position and unable to hold for 15 seconds    From Standing, Reach Forward with Outstretched Arm  Can reach forward >5 cm safely (2")    From Standing Position, Pick up Object from Floor  Unable to try/needs assist to keep balance    From Standing Position, Turn to Look Behind Over each Shoulder  Needs supervision when turning    Turn 360 Degrees  Needs assistance while turning    Standing Unsupported, Alternately Place Feet on Step/Stool  Needs assistance to keep from falling or unable to try    Standing Unsupported, One Foot in [employer] balance while stepping or standing    Standing on One Leg  Unable to try or needs assist to prevent fall    Total Score  20    Berg comment:  Scores <45/56 indicate increased fall risk      Timed Up and Go Test   Normal  TUG (seconds)  31.97    Cognitive TUG (seconds)  34.47    TUG Comments  Scores >13.5 sec indicate increased fall risk; >30 sec indicates difficulty with ADLs in home                Objective measurements completed on examination: See above findings.              PT Education - 05/08/18 1202    Education Details  At fall risk per objective measures, and recommended use of rollator walker at all times (including turning to sit); sit<>stand transfer technique    Person(s) Educated  Patient;Spouse    Methods  Explanation;Demonstration;Verbal cues    Comprehension  Verbalized understanding;Returned demonstration;Verbal cues required;Need further instruction          PT Long Term Goals - 05/08/18 1214      PT LONG TERM GOAL #1   Title  Pt will perform HEP with husband's supervision for improved balance, transfers, and gait.  TARGET 06/06/18    Time  4    Status  New    Target Date  06/06/18  PT LONG TERM GOAL #2   Title  Pt/husband will verbalize understanding of fall prevention in the home environment, including tips to reduce freezing with gait.    Time  4    Period  Weeks    Status  New    Target Date  06/06/18      PT LONG TERM GOAL #3   Title  Pt will improve gait velocity to at least 1.8 ft/sec for decreased fall risk    Time  4    Period  Weeks    Target Date  06/06/18      PT LONG TERM GOAL #4   Title  Pt will imporve TUG score to less than or equal to 25 seconds for decreased fall risk.    Time  4    Period  Weeks    Status  New    Target Date  06/06/18      PT LONG TERM GOAL #5   Title  Pt will improved Berg Balance score to at least 28/56 for decreased fall risk.    Time  4    Period  Weeks    Status  New    Target Date  06/06/18             Plan - 05/08/18 1206    Clinical Impression Statement  Pt is an 84 year old female with at least 7 year history of Parkinson's disease, known to this PT from OPPT >4 years ago.  Pt  presents with decline in functional mobility, history of falls (though no falls in past 6 months), decreased balance, decreased functional strength, decreased safety and independence with gait, freezing and festinating episodes with gait.  Pt is at fall risk per Berg, TUG, and gait velocity scores.  Pt will benefit from skilled PT to address the above stated deficits to improve functional moiblity and to decrease fall risk.    History and Personal Factors relevant to plan of care:  PMH >3 co-morbidities, hx of falls (>6 months ago); >3 systems involved    Clinical Presentation  Evolving    Clinical Presentation due to:  at fall risk per Berg, TUG, and gait velocity scores; freezing episodes affecting mobility to and from dining room    Clinical Decision Making  Moderate    Rehab Potential  Good    Clinical Impairments Affecting Rehab Potential  memory changes-per husband report; arthritis pain in knees, back limiting standing    PT Frequency  2x / week    PT Duration  4 weeks plus eval; additional therapy may be warranted after current session based on progress    PT Treatment/Interventions  ADLs/Self Care Home Management;DME Instruction;Balance training;Therapeutic exercise;Therapeutic activities;Functional mobility training;Gait training;Neuromuscular re-education;Patient/family education    PT Next Visit Plan  Review sit<>stand transfers; go over tips to reduce freezing episodes and practice at automatic door; initiate HEP (being mindful of knee arthritis pain incr. in standing)    Consulted and Agree with Plan of Care  Patient;Family member/caregiver       Patient will benefit from skilled therapeutic intervention in order to improve the following deficits and impairments:  Abnormal gait, Decreased balance, Decreased mobility, Decreased safety awareness, Difficulty walking, Decreased strength, Postural dysfunction  Visit Diagnosis: Other abnormalities of gait and mobility  Unsteadiness on  feet  Muscle weakness (generalized)     Problem List Patient Active Problem List   Diagnosis Date Noted  . Gait abnormality 04/21/2018  . Memory loss 04/21/2018  .  Abnormal nuclear stress test   . Hypothyroidism 02/04/2018  . Chronic back pain 02/04/2018  . Osteoporosis without current pathological fracture 02/04/2018  . Recurrent UTI 02/04/2018  . Sjogren's syndrome ([place]) 02/04/2018  . Chronic constipation 02/04/2018  . RBBB   . Aortic stenosis   . Other dysphagia 09/03/2014  . Unspecified hereditary and idiopathic peripheral neuropathy 09/03/2014  . Heart murmur 04/19/2014  . GERD (gastroesophageal reflux disease)   . Hypercholesterolemia   . PVD (peripheral vascular disease) ([place])   . Barrett esophagus   . Chronic diastolic CHF (congestive heart failure) ([place])   . Parkinson's disease ([place]) 08/26/2013  . Swelling of both ankles 11/20/2012  . Fracture of multiple pubic rami ([place]) 07/17/2012  . Hematoma of hip 07/17/2012  . Leukocytosis 07/17/2012  . Hypertension   . Atherosclerosis of native arteries of the extremities with intermittent claudication 05/22/2012    MARRIOTT,AMY W. 05/08/2018, 12:24 PM  Frazier Butt., PT  [facility] [address] [place], [place], [address] Phone: [phone]   Fax:  [phone]  Name: SAYRA FRISBY MRN: [id number] Date of Birth: [birth year]

## 2018-05-13 DIAGNOSIS — L570 Actinic keratosis: Secondary | ICD-10-CM | POA: Diagnosis not present

## 2018-05-15 DIAGNOSIS — M545 Low back pain: Secondary | ICD-10-CM | POA: Diagnosis not present

## 2018-05-15 DIAGNOSIS — W19XXXA Unspecified fall, initial encounter: Secondary | ICD-10-CM | POA: Diagnosis not present

## 2018-05-15 DIAGNOSIS — M5030 Other cervical disc degeneration, unspecified cervical region: Secondary | ICD-10-CM | POA: Diagnosis not present

## 2018-05-15 DIAGNOSIS — S0083XA Contusion of other part of head, initial encounter: Secondary | ICD-10-CM | POA: Diagnosis not present

## 2018-05-15 DIAGNOSIS — W010XXA Fall on same level from slipping, tripping and stumbling without subsequent striking against object, initial encounter: Secondary | ICD-10-CM | POA: Diagnosis not present

## 2018-05-15 DIAGNOSIS — S0033XA Contusion of nose, initial encounter: Secondary | ICD-10-CM | POA: Diagnosis not present

## 2018-05-15 DIAGNOSIS — S52501A Unspecified fracture of the lower end of right radius, initial encounter for closed fracture: Secondary | ICD-10-CM | POA: Diagnosis not present

## 2018-05-15 DIAGNOSIS — R220 Localized swelling, mass and lump, head: Secondary | ICD-10-CM | POA: Diagnosis not present

## 2018-05-15 DIAGNOSIS — S0093XA Contusion of unspecified part of head, initial encounter: Secondary | ICD-10-CM | POA: Diagnosis not present

## 2018-05-20 DIAGNOSIS — M2550 Pain in unspecified joint: Secondary | ICD-10-CM | POA: Diagnosis not present

## 2018-05-21 ENCOUNTER — Encounter: Payer: Self-pay | Admitting: Physical Therapy

## 2018-05-21 ENCOUNTER — Ambulatory Visit: Payer: Medicare Other | Attending: Nurse Practitioner | Admitting: Physical Therapy

## 2018-05-21 DIAGNOSIS — R2689 Other abnormalities of gait and mobility: Secondary | ICD-10-CM | POA: Insufficient documentation

## 2018-05-21 DIAGNOSIS — M6281 Muscle weakness (generalized): Secondary | ICD-10-CM | POA: Diagnosis not present

## 2018-05-21 DIAGNOSIS — R2681 Unsteadiness on feet: Secondary | ICD-10-CM | POA: Insufficient documentation

## 2018-05-21 NOTE — Patient Instructions (Signed)
Tips to reduce freezing episodes with standing or walking:  1. Stand tall with your feet wide, so that you can rock and weight shift through your hips. 2. Don't try to fight the freeze: if you begin taking slower, faster, smaller steps, STOP, get your posture tall, and RESET your posture and balance.  Take a deep breath before taking the BIG step to start again. 3. March in place, with high knee stepping, to get started walking again. 4. Use auditory cues:  Count out loud, think of a familiar tune or song or cadence, use pocket metronome, to use rhythm to get started walking again. 5. Use visual cues:  Use a line to step over, use laser pointer line to step over, (using BIG steps) to start walking again. 6. Use visual targets to keep your posture tall (look ahead and focus on an object or target at eye level). 7. As you approach where your destination with walking, count your steps out loud and/or focus on your target with your eyes until you are fully there. 8. Use appropriate assistive device, as advised by your physical therapist to assist with taking longer, consistent steps.    

## 2018-05-21 NOTE — Therapy (Signed)
[facility] [address] [place], [place], [address] Phone: [phone]   Fax:  [phone]  Physical Therapy Treatment  Patient Details  Name: [name] MRN: [id number] Date of Birth: November 06, 1931 Referring Provider: Dennie Bible, NP   Encounter Date: 05/21/2018  PT End of Session - 05/21/18 2104    Visit Number  2    Number of Visits  9    Date for PT Re-Evaluation  07/07/18    Authorization Type  Medicare, BCBS supplement    PT Start Time  0846    PT Stop Time  0929    PT Time Calculation (min)  43 min    Activity Tolerance  Patient tolerated treatment well    Behavior During Therapy  [facility] for tasks assessed/performed       Past Medical History:  Diagnosis Date  . Anemia   . Anxiety   . Aortic stenosis    moderate by echo 2019  . Arthritis   . Broken arm    right   . Chronic diastolic CHF (congestive heart failure) ([place])   . Family history of adverse reaction to anesthesia    pt. states sister vomits  . Fibromyalgia   . Fracture of thumb 09/01/2015   right  . Fracture, foot 09/01/2015   right  . GERD (gastroesophageal reflux disease)   . H/O hiatal hernia   . History of bronchitis   . History of kidney stones   . Hypercholesterolemia   . Hypertension    dr t turner  . Hypothyroidism   . Neuropathy   . Osteoporosis   . Parkinson disease ([place])   . PVD (peripheral vascular disease) (HCC)    99% stenosis of left anteiror tibial artery, mod stenosis of left distal SFA and popliteal artery followed by [name]  . RBBB    noted on EKG 2018  . Restless leg   . Shortness of breath   . Sjogren's disease ([place])   . SOB (shortness of breath)    chronic due to diastolic dysfunction, deconditioning, obesity  . Tremor     Past Surgical History:  Procedure Laterality Date  . ABDOMINAL HYSTERECTOMY    . BACK SURGERY     4 back surgeries,   lumbar fusion  . CARDIAC CATHETERIZATION  2006    normal  . EYE SURGERY Bilateral    cateracts  . falls     variious fall, broken wrist,and toes  . FRACTURE SURGERY Right April 2016   Wrist, Pt. fell  . RIGHT/LEFT HEART CATH AND CORONARY ANGIOGRAPHY N/A 02/05/2018   Procedure: RIGHT/LEFT HEART CATH AND CORONARY ANGIOGRAPHY;  Surgeon: Troy Sine, MD;  Location: [facility];  Service: Cardiovascular;  Laterality: N/A;  . SPINAL CORD STIMULATOR INSERTION N/A 09/09/2015   Procedure: LUMBAR SPINAL CORD STIMULATOR INSERTION;  Surgeon: Clydell Hakim, MD;  Location: [facility];  Service: Neurosurgery;  Laterality: N/A;  LUMBAR SPINAL CORD STIMULATOR INSERTION  . SPINE SURGERY  April 2013   Back X's 4  . TOTAL HIP ARTHROPLASTY     right  . WRIST FRACTURE SURGERY Bilateral     There were no vitals filed for this visit.  Subjective Assessment - 05/21/18 0850    Subjective  Had a fall on the trip to [place].  Fell backwards while walking up a hill that turned pretty steep.  WEnt to ED and no fractures, but still some pain in low back.  To see  primary care tomorrow.  Not sure I can do much today.    Patient is accompained by:  Family member husband, Saralyn Pilar    Pertinent History  Parkinson's disease (since 2012), memory deficits, PVD, chronic back pain, arthritis, hx of fractures due to falls; osteoporosis    Patient Stated Goals  Pt's goals for therapy are to get up and down better and help with mobility.    Currently in Pain?  Yes    Pain Score  6     Pain Location  Back    Pain Orientation  Mid;Lower;Right    Pain Descriptors / Indicators  Tender;Sore    Pain Type  Acute pain    Pain Onset  In the past 7 days    Pain Frequency  Intermittent    Aggravating Factors   from recent fall, getting up from sitting    Pain Relieving Factors  repositioning in recliner    Multiple Pain Sites  Yes    Pain Score  5    Pain Location  Knee    Pain Orientation  Right;Left    Pain Descriptors / Indicators  Aching    Pain Type  Chronic  pain    Pain Onset  More than a month ago    Pain Frequency  Intermittent    Aggravating Factors   walking    Pain Relieving Factors  sitting                       [facility] Adult PT Treatment/Exercise - 05/21/18 0902      Transfers   Transfers  Sit to Stand;Stand to Sit    Sit to Stand  4: Min assist;With upper extremity assist;From bed;From chair/3-in-1 Extra time and cues required    Sit to Stand Details (indicate cue type and reason)  Attempted to assist with cueing for forward lean and increased use of momentum to stand, but pt has difficulty with this method, reporting increased back pain.  Pt tends to keep head in line with body pushing straight up to stand, reuqiring definite assistance.    Stand to Sit  4: Min assist;With upper extremity assist;To chair/3-in-1    Number of Reps  Other sets (comment) 5 reps from elevated mat; at least 3 reps from [employer]      Ambulation/Gait   Ambulation/Gait  Yes    Ambulation/Gait Assistance  5: Supervision;4: Min guard    Ambulation Distance (Feet)  80 Feet x 2; 115    Assistive device  -- 3-wheeled RW    Gait Pattern  Step-through pattern;Decreased step length - left;Trunk flexed;Poor foot clearance - left;Poor foot clearance - right;Right foot flat;Left foot flat 1 episode of foot catching    Ambulation Surface  Level;Indoor    Pre-Gait Activities  Upon standing, practiced wide BOS weightshifting prior to initiating gait.  Gait through automatic doors x 3 reps leaving center.  PT pushes button for doors, and pt instructed to start gait with BIG steps as soon as button pushed to make it through doorway in time.  Performed with min guard.    Gait Comments  Cues provided for increased foot clearance, heelstrike and step length with gait.      Self-Care   Self-Care  Other Self-Care Comments    Other Self-Care Comments   Discussed tips to reduce freezing episodes with gait, doorways and turns, especially in relation to automatic  doors and doorways at their [place]  PT Education - 05/21/18 2103    Education Details  Tips to reduce freezing with gait and doorway negotiation; discussion regarding recent fall-pt may require supervision whenever ambulating    Person(s) Educated  Patient;Spouse    Methods  Explanation;Demonstration;Verbal cues    Comprehension  Verbalized understanding;Returned demonstration;Need further instruction          PT Long Term Goals - 05/08/18 1214      PT LONG TERM GOAL #1   Title  Pt will perform HEP with husband's supervision for improved balance, transfers, and gait.  TARGET 06/06/18    Time  4    Status  New    Target Date  06/06/18      PT LONG TERM GOAL #2   Title  Pt/husband will verbalize understanding of fall prevention in the home environment, including tips to reduce freezing with gait.    Time  4    Period  Weeks    Status  New    Target Date  06/06/18      PT LONG TERM GOAL #3   Title  Pt will improve gait velocity to at least 1.8 ft/sec for decreased fall risk    Time  4    Period  Weeks    Target Date  06/06/18      PT LONG TERM GOAL #4   Title  Pt will imporve TUG score to less than or equal to 25 seconds for decreased fall risk.    Time  4    Period  Weeks    Status  New    Target Date  06/06/18      PT LONG TERM GOAL #5   Title  Pt will improved Berg Balance score to at least 28/56 for decreased fall risk.    Time  4    Period  Weeks    Status  New    Target Date  06/06/18            Plan - 05/21/18 2106    Clinical Impression Statement  Pt has experienced a fall since last PT visit (per pt and husband ED visit was negative for any fractures).  Focused this skilled PT session on instruction in tips to reduce freezing with gait, worked on transfers and improved foot clearance/heelstrike with gait.  Pt continues to have considerable difficulty with sit<>stand transfers, with pt more likely to reach forward for  support to stand from walker than to lean forward to create improved momentum for sit<>stand    Rehab Potential  Good    Clinical Impairments Affecting Rehab Potential  memory changes-per husband report; arthritis pain in knees, back limiting standing    PT Frequency  2x / week    PT Duration  4 weeks plus eval; additional therapy may be warranted after current session based on progress    PT Treatment/Interventions  ADLs/Self Care Home Management;DME Instruction;Balance training;Therapeutic exercise;Therapeutic activities;Functional mobility training;Gait training;Neuromuscular re-education;Patient/family education    PT Next Visit Plan  Husband to time automatic doors for practice for doorway negotiation; work on sit<>stand, HEP, gait (check to see if any restrictions per MD visit 05/22/18 from recent fall)    Consulted and Agree with Plan of Care  Patient;Family member/caregiver       Patient will benefit from skilled therapeutic intervention in order to improve the following deficits and impairments:  Abnormal gait, Decreased balance, Decreased mobility, Decreased safety awareness, Difficulty walking, Decreased strength, Postural dysfunction  Visit Diagnosis: Other abnormalities of  gait and mobility  Unsteadiness on feet     Problem List Patient Active Problem List   Diagnosis Date Noted  . Gait abnormality 04/21/2018  . Memory loss 04/21/2018  . Abnormal nuclear stress test   . Hypothyroidism 02/04/2018  . Chronic back pain 02/04/2018  . Osteoporosis without current pathological fracture 02/04/2018  . Recurrent UTI 02/04/2018  . Sjogren's syndrome ([place]) 02/04/2018  . Chronic constipation 02/04/2018  . RBBB   . Aortic stenosis   . Other dysphagia 09/03/2014  . Unspecified hereditary and idiopathic peripheral neuropathy 09/03/2014  . Heart murmur 04/19/2014  . GERD (gastroesophageal reflux disease)   . Hypercholesterolemia   . PVD (peripheral vascular disease) ([place])   .  Barrett esophagus   . Chronic diastolic CHF (congestive heart failure) ([place])   . Parkinson's disease ([place]) 08/26/2013  . Swelling of both ankles 11/20/2012  . Fracture of multiple pubic rami ([place]) 07/17/2012  . Hematoma of hip 07/17/2012  . Leukocytosis 07/17/2012  . Hypertension   . Atherosclerosis of native arteries of the extremities with intermittent claudication 05/22/2012    Maysun Meditz W. 05/21/2018, 9:13 PM Frazier Butt., PT  [facility] [address] [place], [place], [address] Phone: [phone]   Fax:  [phone]  Name: [name] MRN: [id number] Date of Birth: [birth year]

## 2018-05-22 ENCOUNTER — Non-Acute Institutional Stay: Payer: Medicare Other | Admitting: Nurse Practitioner

## 2018-05-22 ENCOUNTER — Other Ambulatory Visit: Payer: Self-pay | Admitting: *Deleted

## 2018-05-22 ENCOUNTER — Encounter: Payer: Self-pay | Admitting: Nurse Practitioner

## 2018-05-22 DIAGNOSIS — G8929 Other chronic pain: Secondary | ICD-10-CM | POA: Diagnosis not present

## 2018-05-22 DIAGNOSIS — I2510 Atherosclerotic heart disease of native coronary artery without angina pectoris: Secondary | ICD-10-CM

## 2018-05-22 DIAGNOSIS — M549 Dorsalgia, unspecified: Secondary | ICD-10-CM | POA: Diagnosis not present

## 2018-05-22 DIAGNOSIS — M545 Low back pain, unspecified: Secondary | ICD-10-CM | POA: Insufficient documentation

## 2018-05-22 MED ORDER — TRAMADOL HCL ER 200 MG PO TB24: 200.0000 mg | ORAL_TABLET | Freq: Every day | ORAL | 0 refills | Status: DC

## 2018-05-22 MED ORDER — TRAMADOL HCL 50 MG PO TABS: 50.0000 mg | ORAL_TABLET | Freq: Three times a day (TID) | ORAL | 0 refills | Status: DC | PRN

## 2018-05-22 NOTE — Assessment & Plan Note (Signed)
Hx of lower back pain and knee pain, taking Tramadol 200mg  qd, 50mg  bid prn. Celebrex 200mg  bid. Has implanted pain stimulator at left lower back for back pain. S/p spine surgery.

## 2018-05-22 NOTE — Patient Instructions (Addendum)
Please f/u with neurosurgeon Dr. Sherley Bounds. Continue PT for gait and pain. Continue Tramadol for pain control. Encourage adequate fluid intake, activity as tolerated, risk for constipation and UTI given hx of UTI.

## 2018-05-22 NOTE — Progress Notes (Signed)
Location:   clinic [facility]   Place of Service:  Clinic (12) Provider: Marlana Latus NP  Code Status: DNR Goals of Care: [place] 05/08/2018  Does Patient Have a Medical Advance Directive? Yes  Type of Paramedic of [place];Living will  Does patient want to make changes to medical advance directive? -  Copy of [place] in Chart? -  Would patient like information on creating a medical advance directive? -  Pre-existing out of facility DNR order (yellow form or pink MOST form) -     Chief Complaint  Patient presents with  . Acute Visit    back pain    HPI: Patient is a 84 y.o. female seen today for an acute visit for right lower back pain sustained from fall 05/15/18. ED evaluation @ [facility]. The patient and her husband stated she had X-ray of lower back, CT of head done with no fractures. Pending results transferring to [facility]. Her right lower back pain is positional, not radiating to legs, no change of  B+B. Hx of lower back pain and knee pain, taking Tramadol 200mg  qd, 50mg  bid prn. Celebrex 200mg  bid. Has implanted pain stimulator at left lower back for back pain. S/p spine surgery.  Past Medical History:  Diagnosis Date  . Anemia   . Anxiety   . Aortic stenosis    moderate by echo 2019  . Arthritis   . Broken arm    right   . Chronic diastolic CHF (congestive heart failure) ([place])   . Family history of adverse reaction to anesthesia    pt. states sister vomits  . Fibromyalgia   . Fracture of thumb 09/01/2015   right  . Fracture, foot 09/01/2015   right  . GERD (gastroesophageal reflux disease)   . H/O hiatal hernia   . History of bronchitis   . History of kidney stones   . Hypercholesterolemia   . Hypertension    dr t turner  . Hypothyroidism   . Neuropathy   . Osteoporosis   . Parkinson disease ([place])   . PVD (peripheral vascular disease) (HCC)    99% stenosis of left anteiror tibial  artery, mod stenosis of left distal SFA and popliteal artery followed by [name]  . RBBB    noted on EKG 2018  . Restless leg   . Shortness of breath   . Sjogren's disease ([place])   . SOB (shortness of breath)    chronic due to diastolic dysfunction, deconditioning, obesity  . Tremor     Past Surgical History:  Procedure Laterality Date  . ABDOMINAL HYSTERECTOMY    . BACK SURGERY     4 back surgeries,   lumbar fusion  . CARDIAC CATHETERIZATION  2006   normal  . EYE SURGERY Bilateral    cateracts  . falls     variious fall, broken wrist,and toes  . FRACTURE SURGERY Right April 2016   Wrist, Pt. fell  . RIGHT/LEFT HEART CATH AND CORONARY ANGIOGRAPHY N/A 02/05/2018   Procedure: RIGHT/LEFT HEART CATH AND CORONARY ANGIOGRAPHY;  Surgeon: Troy Sine, MD;  Location: [facility];  Service: Cardiovascular;  Laterality: N/A;  . SPINAL CORD STIMULATOR INSERTION N/A 09/09/2015   Procedure: LUMBAR SPINAL CORD STIMULATOR INSERTION;  Surgeon: Clydell Hakim, MD;  Location: [facility];  Service: Neurosurgery;  Laterality: N/A;  LUMBAR SPINAL CORD STIMULATOR INSERTION  . SPINE SURGERY  April 2013   Back X's  4  . TOTAL HIP ARTHROPLASTY     right  . WRIST FRACTURE SURGERY Bilateral     Allergies  Allergen Reactions  . Sulfa Antibiotics Other (See Comments)    Headache, very sick  . Codeine Other (See Comments)    Reaction:  Headaches and nightmares   . Lactose Intolerance (Gi) Nausea And Vomiting  . Latex Rash  . Lyrica [Pregabalin] Swelling and Other (See Comments)    Reaction:  Leg swelling  . Other Other (See Comments)    Pt states that pain medications give her nightmares.    . Plaquenil [Hydroxychloroquine Sulfate] Other (See Comments)    Reaction:  GI upset   . Reglan [Metoclopramide] Other (See Comments)    Reaction:  GI upset   . Requip [Ropinirole Hcl] Other (See Comments)    Reaction:  GI upset   . Septra [Sulfamethoxazole-Trimethoprim] Nausea And Vomiting  .  Shellfish Allergy Nausea And Vomiting    Allergies as of 05/22/2018      Reactions   Sulfa Antibiotics Other (See Comments)   Headache, very sick   Codeine Other (See Comments)   Reaction:  Headaches and nightmares    Lactose Intolerance (gi) Nausea And Vomiting   Latex Rash   Lyrica [pregabalin] Swelling, Other (See Comments)   Reaction:  Leg swelling   Other Other (See Comments)   Pt states that pain medications give her nightmares.     Plaquenil [hydroxychloroquine Sulfate] Other (See Comments)   Reaction:  GI upset    Reglan [metoclopramide] Other (See Comments)   Reaction:  GI upset    Requip [ropinirole Hcl] Other (See Comments)   Reaction:  GI upset    Septra [sulfamethoxazole-trimethoprim] Nausea And Vomiting   Shellfish Allergy Nausea And Vomiting      Medication List        Accurate as of 05/22/18 11:59 PM. Always use your most recent med list.          aspirin EC 81 MG tablet Take 81 mg by mouth at bedtime.   CALCIUM 1000 + D 1000-800 MG-UNIT Tabs Generic drug:  Calcium Carb-Cholecalciferol Take 1 tablet by mouth daily.   carbidopa-levodopa 25-100 MG tablet Commonly known as:  SINEMET IR Take 2 tablets by mouth 3 (three) times daily.   celecoxib 200 MG capsule Commonly known as:  CELEBREX Take 200 mg by mouth 2 (two) times daily.   denosumab 60 MG/ML Soln injection Commonly known as:  PROLIA Inject 60 mg into the skin every 6 (six) months. Administer in upper arm, thigh, or abdomen   DEXILANT 60 MG capsule Generic drug:  dexlansoprazole Take 60 mg by mouth daily.   diphenhydramine-acetaminophen 25-500 MG Tabs tablet Commonly known as:  TYLENOL PM Take 1 tablet by mouth at bedtime.   docusate sodium 100 MG capsule Commonly known as:  COLACE Take 100 mg by mouth at bedtime.   DULoxetine 20 MG capsule Commonly known as:  CYMBALTA Take 20 mg by mouth daily.   eucerin cream Apply 1 application topically daily.   ezetimibe 10 MG  tablet Commonly known as:  ZETIA Take 10 mg by mouth at bedtime.   hydroxychloroquine 200 MG tablet Commonly known as:  PLAQUENIL Take 200 mg by mouth daily.   levothyroxine 50 MCG tablet Commonly known as:  SYNTHROID, LEVOTHROID Take 50 mcg by mouth daily before breakfast.   lidocaine 5 % ointment Commonly known as:  XYLOCAINE Apply 2-3 application topically 4 (four) times daily. Apply to affected  area/feet   lovastatin 40 MG tablet Commonly known as:  MEVACOR Take 40 mg by mouth at bedtime.   metoprolol tartrate 25 MG tablet Commonly known as:  LOPRESSOR TAKE 1 TABLET ONCE DAILY.   multivitamin with minerals Tabs tablet Take 1 tablet by mouth daily.   pramipexole 1 MG tablet Commonly known as:  MIRAPEX Take 1 tablet (1 mg total) by mouth 3 (three) times daily.   SYSTANE 0.4-0.3 % Soln Generic drug:  Polyethyl Glycol-Propyl Glycol Place 1 drop into both eyes daily.   SYSTANE 0.4-0.3 % Gel ophthalmic gel Generic drug:  Polyethyl Glycol-Propyl Glycol Place 1 application into both eyes at bedtime.   traMADol 200 MG 24 hr tablet Commonly known as:  ULTRAM-ER Take 1 tablet (200 mg total) by mouth daily.   traMADol 50 MG tablet Commonly known as:  ULTRAM Take 1 tablet (50 mg total) by mouth 3 (three) times daily as needed.   trimethoprim 100 MG tablet Commonly known as:  TRIMPEX Take 100 mg by mouth daily.   Vitamin D 2000 units Caps Take 2,000 Units by mouth daily.       Review of Systems:  Review of Systems  Health Maintenance  Topic Date Due  . PNA vac Low Risk Adult (1 of 2 - PCV13) 10/01/1998  . INFLUENZA VACCINE  07/10/2018  . TETANUS/TDAP  08/31/2025  . DEXA SCAN  Completed    Physical Exam: Vitals:   05/22/18 1405  BP: 120/60  Pulse: 74  Resp: 20  Temp: 98.5 F (36.9 C)  SpO2: 97%  Weight: 166 lb 9.6 oz (75.6 kg)  Height: 5\' 2"  (1.575 m)   Body mass index is 30.47 kg/m. Physical Exam  Constitutional: She is oriented to person,  place, and time. She appears well-developed and well-nourished.  HENT:  Head: Normocephalic and atraumatic.  A resolving hematoma right occiput.   Eyes: Pupils are equal, round, and reactive to light. Conjunctivae and EOM are normal.  Neck: Normal range of motion. Neck supple. No JVD present. No thyromegaly present.  Cardiovascular: Normal rate and regular rhythm.  Murmur heard. Pulmonary/Chest: Effort normal. She has no wheezes. She has no rales.  Musculoskeletal: She exhibits edema and tenderness.  Right lower back pain palpated, otherwise is positional. Ambulates with walker. Trace edema BLE  Neurological: She is alert and oriented to person, place, and time. No cranial nerve deficit. She exhibits normal muscle tone. Coordination normal.  Skin: Skin is warm and dry.  Psychiatric: She has a normal mood and affect. Her behavior is normal.    Labs reviewed: Basic Metabolic Panel: Recent Labs    01/24/18 1059 02/03/18 1220 04/15/18 0725  NA 139 143  --   K 4.6 4.5  --   CL 105 106  --   CO2 20 22  --   GLUCOSE 96 95  --   BUN 20 20  --   CREATININE 0.89 0.77  --   CALCIUM 9.7 9.9  --   TSH  --   --  1.28   Liver Function Tests: Recent Labs    01/31/18 0821  AST 32  ALT 7  ALKPHOS 56  BILITOT 0.4  PROT 6.1  ALBUMIN 4.2   No results for input(s): LIPASE, AMYLASE in the last 8760 hours. No results for input(s): AMMONIA in the last 8760 hours. CBC: Recent Labs    02/03/18 1220  WBC 7.6  HGB 10.6*  HCT 31.9*  MCV 89  PLT 330   Lipid  Panel: Recent Labs    01/31/18 0821  CHOL 155  HDL 61  LDLCALC 55  TRIG 196*  CHOLHDL 2.5   No results found for: HGBA1C  Procedures since last visit: No results found.  Assessment/Plan Acute right-sided low back pain ight lower back pain sustained from fall 05/15/18. ED evaluation @ [facility]. The patient and her husband stated she had X-ray of lower back, CT of head done with no fractures.  Pending results transferring to [facility]. Her right lower back pain is positional, not radiating to legs, no change of  B+B. F/u neurosurgeon, continue pain control with Tramadol, PT to eval and treat.   Chronic back pain Hx of lower back pain and knee pain, taking Tramadol 200mg  qd, 50mg  bid prn. Celebrex 200mg  bid. Has implanted pain stimulator at left lower back for back pain. S/p spine surgery.     Labs/tests ordered: none  Plan of care reviewed with the patient and the patient's husband.   Next appt:  07/01/2018  Time spend 25 minutes.

## 2018-05-22 NOTE — Assessment & Plan Note (Signed)
ight lower back pain sustained from fall 05/15/18. ED evaluation @ [facility]. The patient and her husband stated she had X-ray of lower back, CT of head done with no fractures. Pending results transferring to [facility]. Her right lower back pain is positional, not radiating to legs, no change of  B+B. F/u neurosurgeon, continue pain control with Tramadol, PT to eval and treat.

## 2018-05-23 ENCOUNTER — Encounter: Payer: Self-pay | Admitting: Physical Therapy

## 2018-05-23 ENCOUNTER — Ambulatory Visit: Payer: Medicare Other | Admitting: Physical Therapy

## 2018-05-23 ENCOUNTER — Encounter: Payer: Self-pay | Admitting: Nurse Practitioner

## 2018-05-23 DIAGNOSIS — R2689 Other abnormalities of gait and mobility: Secondary | ICD-10-CM

## 2018-05-23 DIAGNOSIS — R2681 Unsteadiness on feet: Secondary | ICD-10-CM

## 2018-05-23 DIAGNOSIS — M6281 Muscle weakness (generalized): Secondary | ICD-10-CM | POA: Diagnosis not present

## 2018-05-23 NOTE — Therapy (Signed)
[facility] [address] [place], [place], [address] Phone: [phone]   Fax:  [phone]  Physical Therapy Treatment  Patient Details  Name: [name] MRN: [id number] Date of Birth: Sep 05, 1931 Referring Provider: Dennie Bible, NP   Encounter Date: 05/23/2018  PT End of Session - 05/23/18 1312    Visit Number  3    Number of Visits  9    Date for PT Re-Evaluation  07/07/18    Authorization Type  Medicare, BCBS supplement    PT Start Time  0805    PT Stop Time  0846    PT Time Calculation (min)  41 min    Activity Tolerance  Patient tolerated treatment well    Behavior During Therapy  [facility] for tasks assessed/performed       Past Medical History:  Diagnosis Date  . Anemia   . Anxiety   . Aortic stenosis    moderate by echo 2019  . Arthritis   . Broken arm    right   . Chronic diastolic CHF (congestive heart failure) ([place])   . Family history of adverse reaction to anesthesia    pt. states sister vomits  . Fibromyalgia   . Fracture of thumb 09/01/2015   right  . Fracture, foot 09/01/2015   right  . GERD (gastroesophageal reflux disease)   . H/O hiatal hernia   . History of bronchitis   . History of kidney stones   . Hypercholesterolemia   . Hypertension    dr t turner  . Hypothyroidism   . Neuropathy   . Osteoporosis   . Parkinson disease ([place])   . PVD (peripheral vascular disease) (H[facility])    99% stenosis of left anteiror tibial artery, mod stenosis of left distal SFA and popliteal artery followed by [name]  . RBBB    noted on EKG 2018  . Restless leg   . Shortness of breath   . Sjogren's disease ([place])   . SOB (shortness of breath)    chronic due to diastolic dysfunction, deconditioning, obesity  . Tremor     Past Surgical History:  Procedure Laterality Date  . ABDOMINAL HYSTERECTOMY    . BACK SURGERY     4 back surgeries,   lumbar fusion  . CARDIAC CATHETERIZATION  2006    normal  . EYE SURGERY Bilateral    cateracts  . falls     variious fall, broken wrist,and toes  . FRACTURE SURGERY Right April 2016   Wrist, Pt. fell  . RIGHT/LEFT HEART CATH AND CORONARY ANGIOGRAPHY N/A 02/05/2018   Procedure: RIGHT/LEFT HEART CATH AND CORONARY ANGIOGRAPHY;  Surgeon: Troy Sine, MD;  Location: [facility];  Service: Cardiovascular;  Laterality: N/A;  . SPINAL CORD STIMULATOR INSERTION N/A 09/09/2015   Procedure: LUMBAR SPINAL CORD STIMULATOR INSERTION;  Surgeon: Clydell Hakim, MD;  Location: [facility];  Service: Neurosurgery;  Laterality: N/A;  LUMBAR SPINAL CORD STIMULATOR INSERTION  . SPINE SURGERY  April 2013   Back X's 4  . TOTAL HIP ARTHROPLASTY     right  . WRIST FRACTURE SURGERY Bilateral     There were no vitals filed for this visit.  Subjective Assessment - 05/23/18 0808    Subjective  Went to MD yesterday and she wants PT to continue.  Plan to schedule with orthopedist to look at back.    Patient is accompained by:  Family member husband, Saralyn Pilar    Pertinent History  Parkinson's disease (since 2012), memory deficits, PVD, chronic back pain, arthritis, hx of fractures due to falls; osteoporosis    Patient Stated Goals  Pt's goals for therapy are to get up and down better and help with mobility.    Currently in Pain?  Yes    Pain Score  5     Pain Location  Back    Pain Orientation  Mid;Lower;Right    Pain Descriptors / Indicators  Tender;Sore    Pain Onset  In the past 7 days    Pain Frequency  Intermittent    Aggravating Factors   from recent falls, getting up from sitting    Pain Relieving Factors  repositioning in receliner    Pain Onset  More than a month ago                       [facility] Adult PT Treatment/Exercise - 05/23/18 0812      Transfers   Transfers  Sit to Stand;Stand to Sit    Sit to Stand  4: Min guard;With upper extremity assist;From chair/3-in-1    Stand to Sit  4: Min guard;With upper extremity  assist;To chair/3-in-1    Number of Reps  Other reps (comment) 5 reps    Comments  Improved ability for sit<>stand today      Ambulation/Gait   Ambulation/Gait  Yes    Ambulation/Gait Assistance  5: Supervision;4: Min guard    Ambulation Distance (Feet)  80 Feet x 2, 120 ft; then 100 ft with 4-wheeled RW    Assistive device  -- 3-wheeled RW    Gait Pattern  Step-through pattern;Decreased step length - left;Trunk flexed;Poor foot clearance - left;Poor foot clearance - right;Right foot flat;Left foot flat Improved foot clearance at end of session    Ambulation Surface  Level;Indoor    Gait Comments  On the way out of clinic, pt presses button to open automatic doors (x 2), with cues to begin BIG steps for gait as soon as button is pushed.  (Husband reports 15 seconds for automatic door opening at their facility).      High Level Balance   High Level Balance Comments  Stagger stance forward/back weightshifting 2 sets x 10 reps, wide BOS lateral weightshifting x 10 reps; then forward step and weightshift x 10 reps, back step and weightshift x 10 reps, then forward<>back step and weightshift x 10 reps with bilateral UE support.      Self-Care   Self-Care  Other Self-Care Comments    Other Self-Care Comments   Posture and positioning for sitting in chair (use of pillow behind back, small stool under feet to keep posture at 90-90 angle and to avoid feet dangling creating stress on backa nd hips).  Discussed being in this position during the day to lessen a little of the time spent in recliner.      Exercises   Exercises  Other Exercises    Other Exercises   Seated exercises as warm-up:  marching in place x 5, LAQ x 3 reps, heelslides x 5 reps, ankle pumps x 10 reps                  PT Long Term Goals - 05/08/18 1214      PT LONG TERM GOAL #1   Title  Pt will perform HEP with husband's supervision for improved balance, transfers, and gait.  TARGET 06/06/18    Time  4    Status  New     Target Date  06/06/18      PT LONG TERM GOAL #2   Title  Pt/husband will verbalize understanding of fall prevention in the home environment, including tips to reduce freezing with gait.    Time  4    Period  Weeks    Status  New    Target Date  06/06/18      PT LONG TERM GOAL #3   Title  Pt will improve gait velocity to at least 1.8 ft/sec for decreased fall risk    Time  4    Period  Weeks    Target Date  06/06/18      PT LONG TERM GOAL #4   Title  Pt will imporve TUG score to less than or equal to 25 seconds for decreased fall risk.    Time  4    Period  Weeks    Status  New    Target Date  06/06/18      PT LONG TERM GOAL #5   Title  Pt will improved Berg Balance score to at least 28/56 for decreased fall risk.    Time  4    Period  Weeks    Status  New    Target Date  06/06/18            Plan - 05/23/18 1312    Clinical Impression Statement  Per MD visit yesterday, no restrictions and she wants pt to continue PT.  Skilled PT session focused on gait training this visit (education to either get new 3-wheeled RW due to front wheel sticking or use rollator walker), including automatic doors, as well as exercises briefly in sitting and in standing for balance.  Pt requires occasional rest breaks, and is able to improve heelstrike with gait towards end of session after standing activities.    Rehab Potential  Good    Clinical Impairments Affecting Rehab Potential  memory changes-per husband report; arthritis pain in knees, back limiting standing    PT Frequency  2x / week    PT Duration  4 weeks plus eval; additional therapy may be warranted after current session based on progress    PT Treatment/Interventions  ADLs/Self Care Home Management;DME Instruction;Balance training;Therapeutic exercise;Therapeutic activities;Functional mobility training;Gait training;Neuromuscular re-education;Patient/family education    PT Next Visit Plan  practice for doorway negotiation; work on  sit<>stand, HEP, gait, standing weightshifting activities, exercises to work on Sports coach and Agree with Plan of Care  Patient;Family member/caregiver       Patient will benefit from skilled therapeutic intervention in order to improve the following deficits and impairments:  Abnormal gait, Decreased balance, Decreased mobility, Decreased safety awareness, Difficulty walking, Decreased strength, Postural dysfunction  Visit Diagnosis: Unsteadiness on feet  Other abnormalities of gait and mobility     Problem List Patient Active Problem List   Diagnosis Date Noted  . Acute right-sided low back pain 05/22/2018  . Gait abnormality 04/21/2018  . Memory loss 04/21/2018  . Abnormal nuclear stress test   . Hypothyroidism 02/04/2018  . Chronic back pain 02/04/2018  . Osteoporosis without current pathological fracture 02/04/2018  . Recurrent UTI 02/04/2018  . Sjogren's syndrome ([place]) 02/04/2018  . Chronic constipation 02/04/2018  . RBBB   . Aortic stenosis   . Other dysphagia 09/03/2014  . Unspecified hereditary and idiopathic peripheral neuropathy 09/03/2014  . Heart murmur 04/19/2014  . GERD (gastroesophageal reflux disease)   . Hypercholesterolemia   .  PVD (peripheral vascular disease) ([place])   . Barrett esophagus   . Chronic diastolic CHF (congestive heart failure) ([place])   . Parkinson's disease ([place]) 08/26/2013  . Swelling of both ankles 11/20/2012  . Fracture of multiple pubic rami ([place]) 07/17/2012  . Hematoma of hip 07/17/2012  . Leukocytosis 07/17/2012  . Hypertension   . Atherosclerosis of native arteries of the extremities with intermittent claudication 05/22/2012    MARRIOTT,AMY W. 05/23/2018, 1:15 PM  Frazier Butt., PT   [facility] [address] [place], [place], [address] Phone: [phone]   Fax:  [phone]  Name: [name] MRN: [id number] Date of Birth: 1931-03-26

## 2018-05-26 ENCOUNTER — Encounter: Payer: Self-pay | Admitting: Physical Therapy

## 2018-05-26 ENCOUNTER — Ambulatory Visit: Payer: Medicare Other | Admitting: Physical Therapy

## 2018-05-26 DIAGNOSIS — R2689 Other abnormalities of gait and mobility: Secondary | ICD-10-CM | POA: Diagnosis not present

## 2018-05-26 DIAGNOSIS — R2681 Unsteadiness on feet: Secondary | ICD-10-CM

## 2018-05-26 DIAGNOSIS — M6281 Muscle weakness (generalized): Secondary | ICD-10-CM | POA: Diagnosis not present

## 2018-05-26 NOTE — Patient Instructions (Addendum)
Sit to Stand Transfers:  1. Scoot out to the edge of the chair 2. Place your feet flat on the floor, shoulder width apart.  Make sure your feet are tucked just under your knees. 3. Lean forward (nose over toes) with momentum, and stand up tall with your best posture.  If you need to use your arms, use them as a quick boost up to stand. 4. If you are in a low or soft chair, you can lean back and then forward up to stand, in order to get more momentum. 5. Once you are standing, make sure you are looking ahead and standing tall.  To sit down:  1. Back up until you feel the chair behind your legs. 2. Bend at your hips, reaching  Back for you chair, if needed, then slowly squat to sit down on your chair.  Provided handout for stagger stance rocking forward and back, 10 reps

## 2018-05-27 NOTE — Therapy (Signed)
[facility] [address] [place], [place], [address] Phone: [phone]   Fax:  [phone]  Physical Therapy Treatment  Patient Details  Name: [name] MRN: [id number] Date of Birth: 1931-01-01 Referring Provider: Dennie Bible, NP   Encounter Date: 05/26/2018  PT End of Session - 05/27/18 2007    Visit Number  4    Number of Visits  9    Date for PT Re-Evaluation  07/07/18    Authorization Type  Medicare, BCBS supplement    PT Start Time  1238    PT Stop Time  1322    PT Time Calculation (min)  44 min    Equipment Utilized During Treatment  Gait belt    Activity Tolerance  Patient tolerated treatment well    Behavior During Therapy  WFL for tasks assessed/performed       Past Medical History:  Diagnosis Date  . Anemia   . Anxiety   . Aortic stenosis    moderate by echo 2019  . Arthritis   . Broken arm    right   . Chronic diastolic CHF (congestive heart failure) ([place])   . Family history of adverse reaction to anesthesia    pt. states sister vomits  . Fibromyalgia   . Fracture of thumb 09/01/2015   right  . Fracture, foot 09/01/2015   right  . GERD (gastroesophageal reflux disease)   . H/O hiatal hernia   . History of bronchitis   . History of kidney stones   . Hypercholesterolemia   . Hypertension    dr t turner  . Hypothyroidism   . Neuropathy   . Osteoporosis   . Parkinson disease ([place])   . PVD (peripheral vascular disease) (HCC)    99% stenosis of left anteiror tibial artery, mod stenosis of left distal SFA and popliteal artery followed by [name]  . RBBB    noted on EKG 2018  . Restless leg   . Shortness of breath   . Sjogren's disease ([place])   . SOB (shortness of breath)    chronic due to diastolic dysfunction, deconditioning, obesity  . Tremor     Past Surgical History:  Procedure Laterality Date  . ABDOMINAL HYSTERECTOMY    . BACK SURGERY     4 back surgeries,    lumbar fusion  . CARDIAC CATHETERIZATION  2006   normal  . EYE SURGERY Bilateral    cateracts  . falls     variious fall, broken wrist,and toes  . FRACTURE SURGERY Right April 2016   Wrist, Pt. fell  . RIGHT/LEFT HEART CATH AND CORONARY ANGIOGRAPHY N/A 02/05/2018   Procedure: RIGHT/LEFT HEART CATH AND CORONARY ANGIOGRAPHY;  Surgeon: Troy Sine, MD;  Location: [facility];  Service: Cardiovascular;  Laterality: N/A;  . SPINAL CORD STIMULATOR INSERTION N/A 09/09/2015   Procedure: LUMBAR SPINAL CORD STIMULATOR INSERTION;  Surgeon: Clydell Hakim, MD;  Location: [facility];  Service: Neurosurgery;  Laterality: N/A;  LUMBAR SPINAL CORD STIMULATOR INSERTION  . SPINE SURGERY  April 2013   Back X's 4  . TOTAL HIP ARTHROPLASTY     right  . WRIST FRACTURE SURGERY Bilateral     There were no vitals filed for this visit.  Subjective Assessment - 05/26/18 1234    Subjective  Using the 4-wheeled RW today, as the front wheel on the 3-wheeled RW is sticking.  Only pain is groin pain since my fall.  Patient is accompained by:  Family member husband, [name]    Pertinent History  Parkinson's disease (since 2012), memory deficits, PVD, chronic back pain, arthritis, hx of fractures due to falls; osteoporosis    Patient Stated Goals  Pt's goals for therapy are to get up and down better and help with mobility.    Currently in Pain?  Yes    Pain Score  7     Pain Orientation  Right    Pain Onset  In the past 7 days    Pain Frequency  Intermittent    Aggravating Factors   woke up with the pain    Pain Relieving Factors  sitting    Pain Onset  More than a month ago                       [facility] Adult PT Treatment/Exercise - 05/27/18 1956      Transfers   Transfers  Sit to Stand;Stand to Sit    Sit to Stand  4: Min guard;With upper extremity assist;From chair/3-in-1    Stand to Sit  4: Min guard;With upper extremity assist;To chair/3-in-1    Number of Reps  10 reps  from  25" mat surface, simulating bed at home    Transfer Cueing  Cues for transfer technique, including foot placement with widened BOS and tucking feet under knees, with push from mat surface forward lean to stand upright.      Comments  Pt reports improved ease of sit<>stand with practice, decreased pain at session's end      Ambulation/Gait   Ambulation/Gait  Yes    Ambulation/Gait Assistance  5: Supervision;4: Min guard    Ambulation Distance (Feet)  80 Feet then 120 ft x 2, then 20 ft x 2 with rollator    Assistive device  Rollator    Gait Pattern  Step-through pattern;Decreased step length - left;Trunk flexed;Poor foot clearance - left;Poor foot clearance - right Improved attention to heelstrike/foot clearance    Ambulation Surface  Level;Indoor    Gait Comments  Practiced turning rollator all the way around,  to fully turn to sit, using "U-turn" method.  Pt tends to leave rollator and walk away from it to sit (attempted x 2 during session), with PT cues to correct.      High Level Balance   High Level Balance Comments  Stagger stance forward and back weightshifting, at locked rollator, x 10 reps each leg position (with extra reps for optimal weightshift and technique to work on Multimedia programmer  Other Self-Care Comments    Other Self-Care Comments   Discussed pt's pain reports in R groin-pt reports as new pain, but does not hurt necessarily with gait or with weightbearing.  Pt does not c/o pain during session.  Educated patient to monitor pain and to follow-up with MD if pain worsens or intensifies (due to history of fall over a week ago); pt is awaiting orthopedic MD visit to review x-rays from that ED visit (which pt/husband report were negative)     With gait using 4-wheeled rollator (versus 3-wheeled rollator), she appears to have more control and less episodes of festination (due to front wheel sticking on 3-wheeled rollator).  Cues provided to stay  close to rollator and not push it too far in front.        PT Education - 05/27/18 2007    Education Details  Sit<>stand  transfer technique; HEP addition-see instructions    Person(s) Educated  Patient;Spouse    Methods  Explanation;Demonstration;Verbal cues;Handout    Comprehension  Verbalized understanding;Returned demonstration;Verbal cues required;Need further instruction          PT Long Term Goals - 05/08/18 1214      PT LONG TERM GOAL #1   Title  Pt will perform HEP with husband's supervision for improved balance, transfers, and gait.  TARGET 06/06/18    Time  4    Status  New    Target Date  06/06/18      PT LONG TERM GOAL #2   Title  Pt/husband will verbalize understanding of fall prevention in the home environment, including tips to reduce freezing with gait.    Time  4    Period  Weeks    Status  New    Target Date  06/06/18      PT LONG TERM GOAL #3   Title  Pt will improve gait velocity to at least 1.8 ft/sec for decreased fall risk    Time  4    Period  Weeks    Target Date  06/06/18      PT LONG TERM GOAL #4   Title  Pt will imporve TUG score to less than or equal to 25 seconds for decreased fall risk.    Time  4    Period  Weeks    Status  New    Target Date  06/06/18      PT LONG TERM GOAL #5   Title  Pt will improved Berg Balance score to at least 28/56 for decreased fall risk.    Time  4    Period  Weeks    Status  New    Target Date  06/06/18            Plan - 05/27/18 2008    Clinical Impression Statement  Skilled PT session focused today on transfer training technique, safety with gait to transition to turn to sit and initiation of standing weightshifting exercises.  Pt needs extra time and cues to process instructions and perform correctly with repetition.  Pt does report decrease in her pain at end of session.  Pt will continue to benefit from skilled PT to address strength, balance, and gait safety.    Rehab Potential  Good     Clinical Impairments Affecting Rehab Potential  memory changes-per husband report; arthritis pain in knees, back limiting standing    PT Frequency  2x / week    PT Duration  4 weeks plus eval; additional therapy may be warranted after current session based on progress    PT Treatment/Interventions  ADLs/Self Care Home Management;DME Instruction;Balance training;Therapeutic exercise;Therapeutic activities;Functional mobility training;Gait training;Neuromuscular re-education;Patient/family education    PT Next Visit Plan  practice for doorway negotiation; review sit<>stand, HEP, gait, standing weightshifting activities, exercises to work on heelstrike    Consulted and Agree with Plan of Care  Patient;Family member/caregiver       Patient will benefit from skilled therapeutic intervention in order to improve the following deficits and impairments:  Abnormal gait, Decreased balance, Decreased mobility, Decreased safety awareness, Difficulty walking, Decreased strength, Postural dysfunction  Visit Diagnosis: Other abnormalities of gait and mobility  Muscle weakness (generalized)  Unsteadiness on feet     Problem List Patient Active Problem List   Diagnosis Date Noted  . Acute right-sided low back pain 05/22/2018  . Gait abnormality 04/21/2018  . Memory loss 04/21/2018  .  Abnormal nuclear stress test   . Hypothyroidism 02/04/2018  . Chronic back pain 02/04/2018  . Osteoporosis without current pathological fracture 02/04/2018  . Recurrent UTI 02/04/2018  . Sjogren's syndrome ([place]) 02/04/2018  . Chronic constipation 02/04/2018  . RBBB   . Aortic stenosis   . Other dysphagia 09/03/2014  . Unspecified hereditary and idiopathic peripheral neuropathy 09/03/2014  . Heart murmur 04/19/2014  . GERD (gastroesophageal reflux disease)   . Hypercholesterolemia   . PVD (peripheral vascular disease) ([place])   . Barrett esophagus   . Chronic diastolic CHF (congestive heart failure) ([place])   .  Parkinson's disease ([place]) 08/26/2013  . Swelling of both ankles 11/20/2012  . Fracture of multiple pubic rami ([place]) 07/17/2012  . Hematoma of hip 07/17/2012  . Leukocytosis 07/17/2012  . Hypertension   . Atherosclerosis of native arteries of the extremities with intermittent claudication 05/22/2012    Amanda Braun W. 05/27/2018, 8:10 PM  Frazier Butt., PT   [facility] [address] [place], [place], [address] Phone: [phone]   Fax:  [phone]  Name: [name] MRN: [id number] Date of Birth: March 22, 1931

## 2018-05-28 ENCOUNTER — Encounter: Payer: Self-pay | Admitting: Physical Therapy

## 2018-05-28 ENCOUNTER — Ambulatory Visit: Payer: Medicare Other | Admitting: Physical Therapy

## 2018-05-28 DIAGNOSIS — R2689 Other abnormalities of gait and mobility: Secondary | ICD-10-CM

## 2018-05-28 DIAGNOSIS — R2681 Unsteadiness on feet: Secondary | ICD-10-CM | POA: Diagnosis not present

## 2018-05-28 DIAGNOSIS — M6281 Muscle weakness (generalized): Secondary | ICD-10-CM

## 2018-05-28 NOTE — Therapy (Signed)
[facility] [address] [place], [place], [address] Phone: [phone]   Fax:  [phone]  Physical Therapy Treatment  Patient Details  Name: [name] MRN: [id number] Date of Birth: 1931-04-17 Referring Provider: Dennie Bible, NP   Encounter Date: 05/28/2018  PT End of Session - 05/28/18 1209    Visit Number  5    Number of Visits  9    Date for PT Re-Evaluation  07/07/18    Authorization Type  Medicare, BCBS supplement    PT Start Time  [id number] Pt arrived late    PT Stop Time  1010 Ended early due to back pain radiating into leg    PT Time Calculation (min)  33 min    Equipment Utilized During Treatment  Gait belt    Activity Tolerance  Patient tolerated treatment well    Behavior During Therapy  [facility] for tasks assessed/performed       Past Medical History:  Diagnosis Date  . Anemia   . Anxiety   . Aortic stenosis    moderate by echo 2019  . Arthritis   . Broken arm    right   . Chronic diastolic CHF (congestive heart failure) ([place])   . Family history of adverse reaction to anesthesia    pt. states sister vomits  . Fibromyalgia   . Fracture of thumb 09/01/2015   right  . Fracture, foot 09/01/2015   right  . GERD (gastroesophageal reflux disease)   . H/O hiatal hernia   . History of bronchitis   . History of kidney stones   . Hypercholesterolemia   . Hypertension    dr t turner  . Hypothyroidism   . Neuropathy   . Osteoporosis   . Parkinson disease ([place])   . PVD (peripheral vascular disease) (H[facility])    99% stenosis of left anteiror tibial artery, mod stenosis of left distal SFA and popliteal artery followed by [name]  . RBBB    noted on EKG 2018  . Restless leg   . Shortness of breath   . Sjogren's disease ([place])   . SOB (shortness of breath)    chronic due to diastolic dysfunction, deconditioning, obesity  . Tremor     Past Surgical History:  Procedure Laterality Date  .  ABDOMINAL HYSTERECTOMY    . BACK SURGERY     4 back surgeries,   lumbar fusion  . CARDIAC CATHETERIZATION  2006   normal  . EYE SURGERY Bilateral    cateracts  . falls     variious fall, broken wrist,and toes  . FRACTURE SURGERY Right April 2016   Wrist, Pt. fell  . RIGHT/LEFT HEART CATH AND CORONARY ANGIOGRAPHY N/A 02/05/2018   Procedure: RIGHT/LEFT HEART CATH AND CORONARY ANGIOGRAPHY;  Surgeon: [facility] Sine, MD;  Location: [facility];  Service: Cardiovascular;  Laterality: N/A;  . SPINAL CORD STIMULATOR INSERTION N/A 09/09/2015   Procedure: LUMBAR SPINAL CORD STIMULATOR INSERTION;  Surgeon: Clydell Hakim, MD;  Location: [facility];  Service: Neurosurgery;  Laterality: N/A;  LUMBAR SPINAL CORD STIMULATOR INSERTION  . SPINE SURGERY  April 2013   Back X's 4  . TOTAL HIP ARTHROPLASTY     right  . WRIST FRACTURE SURGERY Bilateral     There were no vitals filed for this visit.  Subjective Assessment - 05/28/18 0940    Subjective  Think I pulled a muscle in my back trying to get in the car.  Patient is accompained by:  Family member husband, Saralyn Pilar    Pertinent History  Parkinson's disease (since 2012), memory deficits, PVD, chronic back pain, arthritis, hx of fractures due to falls; osteoporosis    Patient Stated Goals  Pt's goals for therapy are to get up and down better and help with mobility.    Currently in Pain?  Yes    Pain Score  7     Pain Location  Back    Pain Orientation  Right;Lower    Pain Descriptors / Indicators  Sore;Tender    Pain Onset  In the past 7 days    Pain Frequency  Intermittent    Aggravating Factors   worse with getting into the car    Pain Relieving Factors  sitting    Multiple Pain Sites  Yes    Pain Score  6    Pain Location  Groin    Pain Orientation  Right    Pain Descriptors / Indicators  Burning    Pain Onset  1 to 4 weeks ago    Pain Frequency  Intermittent    Aggravating Factors   unsure    Pain Relieving Factors  unsure                        [facility] Adult PT Treatment/Exercise - 05/28/18 0001      Bed Mobility   Bed Mobility  Sit to Supine;Sit to Sidelying Left;Left Sidelying to Sit    Left Sidelying to Sit  Moderate Assistance - Patient 50-74% cues for long roll-assistance through shoulders    Supine to Sit  Moderate Assistance - Patient 50-74%    Sit to Supine  Minimal Assistance - Patient > 75% husband helps with lower extremity lifting onto bed    Sit to Sidelying Left  Minimal Assistance - Patient > 75%      Transfers   Transfers  Sit to Stand;Stand to Sit    Sit to Stand  4: Min guard;With upper extremity assist;From chair/3-in-1;4: Min assist    Stand to Sit  4: Min guard;With upper extremity assist;To chair/3-in-1;4: Min assist    Number of Reps  Other reps (comment) 5 reps    Transfer Cueing  Cues for transfer technique, including foot placement and hand placement.  Pt tries to pull up from rollator; needs tactile cues for hand placement.      Ambulation/Gait   Ambulation/Gait  Yes    Ambulation/Gait Assistance  5: Supervision;4: Min guard    Ambulation/Gait Assistance Details  Pt requires 3 brief standing rest breaks due to increased back pain, reports pain going down into R LE.    Ambulation Distance (Feet)  100 Feet then 120    Assistive device  Rollator    Gait Pattern  Step-through pattern;Decreased step length - left;Trunk flexed;Poor foot clearance - left;Poor foot clearance - right improved attention to foot clearance and heelstrike    Ambulation Surface  Level;Indoor    Gait Comments  Needs cues to fully turn rollator walker around to sit, x 2 reps.             PT Education - 05/28/18 1208    Education Details  Discussed potentially putting PT on hold until pt seeing neurosurgeon as follow-up for back pain (pain is limiting PT progress and activity tolerance)    Person(s) Educated  Patient;Spouse    Methods  Explanation    Comprehension  Verbalized understanding  PT Long Term Goals - 05/08/18 1214      PT LONG TERM GOAL #1   Title  Pt will perform HEP with husband's supervision for improved balance, transfers, and gait.  TARGET 06/06/18    Time  4    Status  New    Target Date  06/06/18      PT LONG TERM GOAL #2   Title  Pt/husband will verbalize understanding of fall prevention in the home environment, including tips to reduce freezing with gait.    Time  4    Period  Weeks    Status  New    Target Date  06/06/18      PT LONG TERM GOAL #3   Title  Pt will improve gait velocity to at least 1.8 ft/sec for decreased fall risk    Time  4    Period  Weeks    Target Date  06/06/18      PT LONG TERM GOAL #4   Title  Pt will imporve TUG score to less than or equal to 25 seconds for decreased fall risk.    Time  4    Period  Weeks    Status  New    Target Date  06/06/18      PT LONG TERM GOAL #5   Title  Pt will improved Berg Balance score to at least 28/56 for decreased fall risk.    Time  4    Period  Weeks    Status  New    Target Date  06/06/18            Plan - 05/28/18 1210    Clinical Impression Statement  Pt continues to need max cues for technique on transfers.  Tried to work on bed mobility (husband's request) and gait today, but pt has increase in pain with both activities, with pain radiating into RLE.  She has consistent pain reports throughout previous sessions, but today she came in with new pain in back.  Also, pt/husband awaiting MD visit with nuerosurgery (clarified today) for back issues.  PT recommneds placing patient on hold until that visit, to see if that doctor has any further plans to assist with pt's pain or restrictions based on pt's pain, to maximize pt's time with PT.    Rehab Potential  Good    Clinical Impairments Affecting Rehab Potential  memory changes-per husband report; arthritis pain in knees, back limiting standing    PT Frequency  2x / week    PT Duration  4 weeks plus eval;  additional therapy may be warranted after current session based on progress    PT Treatment/Interventions  ADLs/Self Care Home Management;DME Instruction;Balance training;Therapeutic exercise;Therapeutic activities;Functional mobility training;Gait training;Neuromuscular re-education;Patient/family education    PT Next Visit Plan  Hold PT until seeing neurosurgeon (keeping appointment on Monday for now and husband will call to cancel if needed)    Consulted and Agree with Plan of Care  Patient;Family member/caregiver       Patient will benefit from skilled therapeutic intervention in order to improve the following deficits and impairments:  Abnormal gait, Decreased balance, Decreased mobility, Decreased safety awareness, Difficulty walking, Decreased strength, Postural dysfunction  Visit Diagnosis: Muscle weakness (generalized)  Other abnormalities of gait and mobility     Problem List Patient Active Problem List   Diagnosis Date Noted  . Acute right-sided low back pain 05/22/2018  . Gait abnormality 04/21/2018  . Memory loss 04/21/2018  . Abnormal nuclear  stress test   . Hypothyroidism 02/04/2018  . Chronic back pain 02/04/2018  . Osteoporosis without current pathological fracture 02/04/2018  . Recurrent UTI 02/04/2018  . Sjogren's syndrome ([place]) 02/04/2018  . Chronic constipation 02/04/2018  . RBBB   . Aortic stenosis   . Other dysphagia 09/03/2014  . Unspecified hereditary and idiopathic peripheral neuropathy 09/03/2014  . Heart murmur 04/19/2014  . GERD (gastroesophageal reflux disease)   . Hypercholesterolemia   . PVD (peripheral vascular disease) ([place])   . Barrett esophagus   . Chronic diastolic CHF (congestive heart failure) ([place])   . Parkinson's disease ([place]) 08/26/2013  . Swelling of both ankles 11/20/2012  . Fracture of multiple pubic rami ([place]) 07/17/2012  . Hematoma of hip 07/17/2012  . Leukocytosis 07/17/2012  . Hypertension   . Atherosclerosis of native  arteries of the extremities with intermittent claudication 05/22/2012    Jenaveve Fenstermaker W. 05/28/2018, 12:14 PM Frazier Butt., PT  [facility] [address] [place], [place], [address] Phone: [phone]   Fax:  [phone]  Name: [name] MRN: [id number] Date of Birth: [birth year]

## 2018-06-02 ENCOUNTER — Ambulatory Visit: Payer: Medicare Other | Admitting: Physical Therapy

## 2018-06-04 ENCOUNTER — Ambulatory Visit: Payer: Medicare Other | Admitting: Physical Therapy

## 2018-06-04 DIAGNOSIS — R2681 Unsteadiness on feet: Secondary | ICD-10-CM

## 2018-06-04 DIAGNOSIS — M6281 Muscle weakness (generalized): Secondary | ICD-10-CM | POA: Diagnosis not present

## 2018-06-04 DIAGNOSIS — R2689 Other abnormalities of gait and mobility: Secondary | ICD-10-CM | POA: Diagnosis not present

## 2018-06-04 NOTE — Patient Instructions (Addendum)
Weight Shift: Diagonal    Standing at your locked walker or at the counter:  Slowly shift weight forward over right leg. Return to starting position. Shift backward over opposite leg. Hold each position __2-3__ seconds. Repeat __10__ times per session. Do __2-3__ sessions per day.    Copyright  VHI. All rights reserved.  Weight Shift: Lateral (Righting / Equilibrium)    Standing at your locked walker:  Feet shoulder width apart, slowly shift weight over right leg, then shift weight over left leg, repeating side to side shifting of your weight. Repeat __10__ times per session. Do _2-3___ sessions per day.  You can make sure to do this one each time you stand up prior to walking.  Copyright  VHI. All rights reserved.

## 2018-06-05 NOTE — Therapy (Signed)
[facility] [address] [place], [place], [address] Phone: [phone]   Fax:  [phone]  Physical Therapy Treatment  Patient Details  Name: [name] MRN: [id number] Date of Birth: Oct 02, 1931 Referring Provider: Dennie Bible, NP   Encounter Date: 06/04/2018  PT End of Session - 06/05/18 1518    Visit Number  6    Number of Visits  9    Date for PT Re-Evaluation  07/07/18    Authorization Type  Medicare, BCBS supplement    PT Start Time  0934    PT Stop Time  1017    PT Time Calculation (min)  43 min    Equipment Utilized During Treatment  Gait belt    Activity Tolerance  Patient tolerated treatment well    Behavior During Therapy  [facility] for tasks assessed/performed       Past Medical History:  Diagnosis Date  . Anemia   . Anxiety   . Aortic stenosis    moderate by echo 2019  . Arthritis   . Broken arm    right   . Chronic diastolic CHF (congestive heart failure) ([place])   . Family history of adverse reaction to anesthesia    pt. states sister vomits  . Fibromyalgia   . Fracture of thumb 09/01/2015   right  . Fracture, foot 09/01/2015   right  . GERD (gastroesophageal reflux disease)   . H/O hiatal hernia   . History of bronchitis   . History of kidney stones   . Hypercholesterolemia   . Hypertension    dr t turner  . Hypothyroidism   . Neuropathy   . Osteoporosis   . Parkinson disease ([place])   . PVD (peripheral vascular disease) (H[facility])    99% stenosis of left anteiror tibial artery, mod stenosis of left distal SFA and popliteal artery followed by [name]  . RBBB    noted on EKG 2018  . Restless leg   . Shortness of breath   . Sjogren's disease ([place])   . SOB (shortness of breath)    chronic due to diastolic dysfunction, deconditioning, obesity  . Tremor     Past Surgical History:  Procedure Laterality Date  . ABDOMINAL HYSTERECTOMY    . BACK SURGERY     4 back surgeries,    lumbar fusion  . CARDIAC CATHETERIZATION  2006   normal  . EYE SURGERY Bilateral    cateracts  . falls     variious fall, broken wrist,and toes  . FRACTURE SURGERY Right April 2016   Wrist, Pt. fell  . RIGHT/LEFT HEART CATH AND CORONARY ANGIOGRAPHY N/A 02/05/2018   Procedure: RIGHT/LEFT HEART CATH AND CORONARY ANGIOGRAPHY;  Surgeon: [facility] Sine, MD;  Location: [facility];  Service: Cardiovascular;  Laterality: N/A;  . SPINAL CORD STIMULATOR INSERTION N/A 09/09/2015   Procedure: LUMBAR SPINAL CORD STIMULATOR INSERTION;  Surgeon: Clydell Hakim, MD;  Location: [facility];  Service: Neurosurgery;  Laterality: N/A;  LUMBAR SPINAL CORD STIMULATOR INSERTION  . SPINE SURGERY  April 2013   Back X's 4  . TOTAL HIP ARTHROPLASTY     right  . WRIST FRACTURE SURGERY Bilateral     There were no vitals filed for this visit.  Subjective Assessment - 06/04/18 0937    Subjective  Have appoinment with neurosurgeon PA on 7/18, to review the reports from MD/ED from fall.    Patient is accompained by:  Family member husband, Saralyn Pilar  Pertinent History  Parkinson's disease (since 2012), memory deficits, PVD, chronic back pain, arthritis, hx of fractures due to falls; osteoporosis    Patient Stated Goals  Pt's goals for therapy are to get up and down better and help with mobility.    Currently in Pain?  Yes    Pain Score  1     Pain Location  Back    Pain Orientation  Left    Pain Type  Chronic pain    Pain Onset  In the past 7 days    Aggravating Factors   sitting position in waiting room chair    Pain Relieving Factors  change positions    Pain Onset  1 to 4 weeks ago                       [facility] Adult PT Treatment/Exercise - 06/05/18 0001      Transfers   Transfers  Sit to Stand;Stand to Sit    Sit to Stand  4: Min guard;With upper extremity assist;From chair/3-in-1;4: Min assist    Stand to Sit  4: Min guard;With upper extremity assist;To chair/3-in-1;4: Min assist     Number of Reps  Other reps (comment) At least 5 reps through PT session    Transfer Cueing  Initial cues for transfer technique, including hand placement on chair and foot placement.  Pt appears to perform sit<>stand with improved ease this visit.      Ambulation/Gait   Ambulation/Gait  Yes    Ambulation/Gait Assistance  5: Supervision;4: Min guard    Ambulation Distance (Feet)  50 Feet x 2, then 80 ft    Assistive device  Rollator    Gait Pattern  Step-through pattern;Decreased step length - left;Trunk flexed;Poor foot clearance - left;Poor foot clearance - right Improved heelstrike with cues     Ambulation Surface  Level;Indoor    Pre-Gait Activities  Upon standing, practiced lateral weightshifting x 10 reps at locked walker, then practiced stagger stance forward/back weightshifting x 10 reps each foot position.  Practiced this in relation to tips to reduce festinating with gait, to improve initiation with gait.  With 2 bouts of 50 ft of gait, pt performs wide walking turn to change directions.    Gait Comments  Practiced door management through double doors, trying to simulate automatic double doors at [facility].  Reiterated techniques to help with automatic door negotiation:  standing with tall posture, with weightshifting to prepare for BIG steps to start as soon as button is pushed.  Discussed/practiced waiting to push button for second door until she is ready to start again with BIG posture and BIG steps.  Assisted pt with threshold negotiation, x 2 reps.          Self Care:  Educated husband in verbal, tactile methods of cueing to stop and reset posture, reset step length, to lessen frequency and severity of freezing episodes, especially recognizing situations that bring on freezing episodes.   PT Education - 06/05/18 1516    Education Details  Reviewed tips to prevent freezing episodes with gait; practiced ways to perform standing weightshifting (added to HEP); discussed ways  pt can effectively cue patient to remember tips to reduce freezing, especially in situations that bring on freezing.    Person(s) Educated  Patient;Spouse    Methods  Explanation;Demonstration;Handout    Comprehension  Verbalized understanding;Returned demonstration;Verbal cues required;Need further instruction          PT Long Term  Goals - 05/08/18 1214      PT LONG TERM GOAL #1   Title  Pt will perform HEP with husband's supervision for improved balance, transfers, and gait.  TARGET 06/06/18    Time  4    Status  New    Target Date  06/06/18      PT LONG TERM GOAL #2   Title  Pt/husband will verbalize understanding of fall prevention in the home environment, including tips to reduce freezing with gait.    Time  4    Period  Weeks    Status  New    Target Date  06/06/18      PT LONG TERM GOAL #3   Title  Pt will improve gait velocity to at least 1.8 ft/sec for decreased fall risk    Time  4    Period  Weeks    Target Date  06/06/18      PT LONG TERM GOAL #4   Title  Pt will imporve TUG score to less than or equal to 25 seconds for decreased fall risk.    Time  4    Period  Weeks    Status  New    Target Date  06/06/18      PT LONG TERM GOAL #5   Title  Pt will improved Berg Balance score to at least 28/56 for decreased fall risk.    Time  4    Period  Weeks    Status  New    Target Date  06/06/18            Plan - 06/05/18 1518    Clinical Impression Statement  Pt appears to have improved sit<>stand trasnfer technique this visit.  Addressed gait initiation, turns with gait, and doorway negotiation, with emphasis on how husband can cue patient to reduce freezing episodes with gait.  With husband's cueing, pt appears to be improving slightly with functional mobility, and will continue to benefit from skilled PT to address functional mobility, decreased fall risk.    Rehab Potential  Good    Clinical Impairments Affecting Rehab Potential  memory changes-per husband  report; arthritis pain in knees, back limiting standing    PT Frequency  2x / week    PT Duration  4 weeks plus eval; additional therapy may be warranted after current session based on progress    PT Treatment/Interventions  ADLs/Self Care Home Management;DME Instruction;Balance training;Therapeutic exercise;Therapeutic activities;Functional mobility training;Gait training;Neuromuscular re-education;Patient/family education    PT Next Visit Plan  Pt to await further PT until seeing neurosurgeon 06/16/18 (showed for PT visit today,with very little pain and pt/husband want to proceed with PT); review HEP and tips to reduce freezing with gait; doorway negotiation. Will need to look at LTGs next week of PT visits; discuss POC    Consulted and Agree with Plan of Care  Patient;Family member/caregiver       Patient will benefit from skilled therapeutic intervention in order to improve the following deficits and impairments:  Abnormal gait, Decreased balance, Decreased mobility, Decreased safety awareness, Difficulty walking, Decreased strength, Postural dysfunction  Visit Diagnosis: Other abnormalities of gait and mobility  Unsteadiness on feet     Problem List Patient Active Problem List   Diagnosis Date Noted  . Acute right-sided low back pain 05/22/2018  . Gait abnormality 04/21/2018  . Memory loss 04/21/2018  . Abnormal nuclear stress test   . Hypothyroidism 02/04/2018  . Chronic back pain 02/04/2018  . Osteoporosis  without current pathological fracture 02/04/2018  . Recurrent UTI 02/04/2018  . Sjogren's syndrome ([place]) 02/04/2018  . Chronic constipation 02/04/2018  . RBBB   . Aortic stenosis   . Other dysphagia 09/03/2014  . Unspecified hereditary and idiopathic peripheral neuropathy 09/03/2014  . Heart murmur 04/19/2014  . GERD (gastroesophageal reflux disease)   . Hypercholesterolemia   . PVD (peripheral vascular disease) ([place])   . Barrett esophagus   . Chronic diastolic CHF  (congestive heart failure) ([place])   . Parkinson's disease ([place]) 08/26/2013  . Swelling of both ankles 11/20/2012  . Fracture of multiple pubic rami ([place]) 07/17/2012  . Hematoma of hip 07/17/2012  . Leukocytosis 07/17/2012  . Hypertension   . Atherosclerosis of native arteries of the extremities with intermittent claudication 05/22/2012    Era Parr W. 06/05/2018, 3:25 PM  Frazier Butt., PT   [facility] [address] [place], [place], [address] Phone: [phone]   Fax:  [phone]  Name: [name] MRN: [id number] Date of Birth: 28-Apr-1931

## 2018-06-09 ENCOUNTER — Ambulatory Visit: Payer: Medicare Other | Admitting: Physical Therapy

## 2018-06-10 ENCOUNTER — Other Ambulatory Visit: Payer: Self-pay

## 2018-06-10 ENCOUNTER — Ambulatory Visit: Payer: Medicare Other | Admitting: Physical Therapy

## 2018-06-10 MED ORDER — ZOSTER VAC RECOMB ADJUVANTED 50 MCG/0.5ML IM SUSR: 0.5000 mL | Freq: Once | INTRAMUSCULAR | 1 refills | Status: AC

## 2018-06-16 ENCOUNTER — Telehealth: Payer: Self-pay | Admitting: Nurse Practitioner

## 2018-06-16 DIAGNOSIS — Z79899 Other long term (current) drug therapy: Secondary | ICD-10-CM | POA: Diagnosis not present

## 2018-06-16 DIAGNOSIS — M545 Low back pain: Secondary | ICD-10-CM | POA: Diagnosis not present

## 2018-06-16 NOTE — Telephone Encounter (Signed)
Spoke with husband who stated she recently had a fall. She had a CT scan which showed kidney stones. She has history of kidney stones. This RN advised she should be evaluated for possible UTI which can cause the symptoms he has described. He stated he would call her urologist. Of note, the patient has not been prescribed any new medications from this provider. Husband stated he'll call back after talking with urologist, verbalized understanding, appreciation of call.

## 2018-06-16 NOTE — Telephone Encounter (Signed)
Has she been checked for UTI

## 2018-06-16 NOTE — Telephone Encounter (Signed)
Pts husband(Patrick) called stating that since last week the pt has been having more server hallucination such as forgetting who Saralyn Pilar was and who she was. Saralyn Pilar requesting a call stating he is unsure if this is due to the pts medications, would like to know if the pt can come in for a f/u sooner than November. Please call to advise

## 2018-06-16 NOTE — Telephone Encounter (Signed)
Called to speak with patient's husband. She stated he is at the doctor. Spoke with patient and asked if she's having any symptoms of UTI. She has history of UTI's. She stated she sometimes feels as if she can't get to bathroom in time, haas felt some urgency. This RN advised her will call back to speak with Mr Demarcus. She verbalized understanding.

## 2018-06-19 DIAGNOSIS — N3000 Acute cystitis without hematuria: Secondary | ICD-10-CM | POA: Diagnosis not present

## 2018-06-26 ENCOUNTER — Ambulatory Visit: Payer: Medicare Other | Admitting: Physical Therapy

## 2018-06-27 ENCOUNTER — Ambulatory Visit: Payer: Medicare Other | Attending: Nurse Practitioner | Admitting: Physical Therapy

## 2018-06-27 DIAGNOSIS — R2681 Unsteadiness on feet: Secondary | ICD-10-CM | POA: Diagnosis not present

## 2018-06-27 DIAGNOSIS — R2689 Other abnormalities of gait and mobility: Secondary | ICD-10-CM | POA: Insufficient documentation

## 2018-06-27 NOTE — Therapy (Signed)
[facility] [address] [place], [place], [address] Phone: [phone]   Fax:  [phone]  Physical Therapy Treatment/Discharge Summary  Patient Details  Name: [name] MRN: [id number] Date of Birth: 03-09-1931 Referring Provider: Dennie Bible, NP   Encounter Date: 06/27/2018  PT End of Session - 06/27/18 2219    Visit Number  7    Number of Visits  9    Date for PT Re-Evaluation  07/07/18    Authorization Type  Medicare, BCBS supplement    PT Start Time  1153    PT Stop Time  1242 Pt had bathroom break during session    PT Time Calculation (min)  49 min    Equipment Utilized During Treatment  Gait belt    Activity Tolerance  Patient tolerated treatment well    Behavior During Therapy  [facility] for tasks assessed/performed       Past Medical History:  Diagnosis Date  . Anemia   . Anxiety   . Aortic stenosis    moderate by echo 2019  . Arthritis   . Broken arm    right   . Chronic diastolic CHF (congestive heart failure) ([place])   . Family history of adverse reaction to anesthesia    pt. states sister vomits  . Fibromyalgia   . Fracture of thumb 09/01/2015   right  . Fracture, foot 09/01/2015   right  . GERD (gastroesophageal reflux disease)   . H/O hiatal hernia   . History of bronchitis   . History of kidney stones   . Hypercholesterolemia   . Hypertension    dr t turner  . Hypothyroidism   . Neuropathy   . Osteoporosis   . Parkinson disease ([place])   . PVD (peripheral vascular disease) ([facility])    99% stenosis of left anteiror tibial artery, mod stenosis of left distal SFA and popliteal artery followed by [name]  . RBBB    noted on EKG 2018  . Restless leg   . Shortness of breath   . Sjogren's disease ([place])   . SOB (shortness of breath)    chronic due to diastolic dysfunction, deconditioning, obesity  . Tremor     Past Surgical History:  Procedure Laterality Date  . ABDOMINAL  HYSTERECTOMY    . BACK SURGERY     4 back surgeries,   lumbar fusion  . CARDIAC CATHETERIZATION  2006   normal  . EYE SURGERY Bilateral    cateracts  . falls     variious fall, broken wrist,and toes  . FRACTURE SURGERY Right April 2016   Wrist, Pt. fell  . RIGHT/LEFT HEART CATH AND CORONARY ANGIOGRAPHY N/A 02/05/2018   Procedure: RIGHT/LEFT HEART CATH AND CORONARY ANGIOGRAPHY;  Surgeon: Troy Sine, MD;  Location: [facility];  Service: Cardiovascular;  Laterality: N/A;  . SPINAL CORD STIMULATOR INSERTION N/A 09/09/2015   Procedure: LUMBAR SPINAL CORD STIMULATOR INSERTION;  Surgeon: Clydell Hakim, MD;  Location: [facility];  Service: Neurosurgery;  Laterality: N/A;  LUMBAR SPINAL CORD STIMULATOR INSERTION  . SPINE SURGERY  April 2013   Back X's 4  . TOTAL HIP ARTHROPLASTY     right  . WRIST FRACTURE SURGERY Bilateral     There were no vitals filed for this visit.  Subjective Assessment - 06/27/18 1157    Subjective  Dr. Ronnald Ramp looked at all the scans from [facility], and said that damage in her back had not changed from  previous.  No falls since last visit.  Wondering if she has a kidney stone, as patient reports new pain similar to kidney stone in the past.    Patient is accompained by:  Family member husband, Saralyn Pilar    Pertinent History  Parkinson's disease (since 2012), memory deficits, PVD, chronic back pain, arthritis, hx of fractures due to falls; osteoporosis    Patient Stated Goals  Pt's goals for therapy are to get up and down better and help with mobility.    Currently in Pain?  Yes    Pain Location  Back pt questions if it's kidney stone related    Pain Orientation  Right;Left    Pain Descriptors / Indicators  -- Intense/buildup    Pain Type  Acute pain    Pain Onset  In the past 7 days    Aggravating Factors   worse with urination    Pain Relieving Factors  unsure    Pain Onset  1 to 4 weeks ago                       [facility] Adult PT  Treatment/Exercise - 06/27/18 0001      Transfers   Transfers  Sit to Stand;Stand to Sit    Sit to Stand  4: Min guard;With upper extremity assist;From chair/3-in-1;4: Min assist    Stand to Sit  4: Min guard;With upper extremity assist;To chair/3-in-1;4: Min assist      Ambulation/Gait   Ambulation/Gait  Yes    Ambulation/Gait Assistance  5: Supervision    Ambulation Distance (Feet)  50 Feet x 2    Assistive device  Rollator 3-wheeled rollator    Gait Pattern  Step-through pattern;Decreased step length - left;Trunk flexed;Poor foot clearance - left;Poor foot clearance - right    Ambulation Surface  Level;Indoor    Gait velocity  24.82 sec = 1.32 ft/sec      Standardized Balance Assessment   Standardized Balance Assessment  Berg Balance Test;Timed Up and Go Test      Berg Balance Test   Sit to Stand  Able to stand  independently using hands    Standing Unsupported  Able to stand 2 minutes with supervision    Sitting with Back Unsupported but Feet Supported on Floor or Stool  Able to sit safely and securely 2 minutes    Stand to Sit  Controls descent by using hands    Transfers  Able to transfer safely, definite need of hands    Standing Unsupported with Eyes Closed  Able to stand 10 seconds with supervision    Standing Ubsupported with Feet Together  Needs help to attain position and unable to hold for 15 seconds    From Standing, Reach Forward with Outstretched Arm  Can reach forward >5 cm safely (2")    From Standing Position, Pick up Object from Floor  Unable to try/needs assist to keep balance    From Standing Position, Turn to Look Behind Over each Shoulder  Turn sideways only but maintains balance    Turn 360 Degrees  Needs close supervision or verbal cueing    Standing Unsupported, Alternately Place Feet on Step/Stool  Needs assistance to keep from falling or unable to try    Standing Unsupported, One Foot in [employer] balance while stepping or standing    Standing on One  Leg  Unable to try or needs assist to prevent fall    Total Score  24  Timed Up and Go Test   TUG  Normal TUG    Normal TUG (seconds)  27.5      Self-Care   Self-Care  Other Self-Care Comments    Other Self-Care Comments   Discussed progress towards goals, POC, pt at continued fall risk and will continue to need to use rollator walker at all times, will likely need husband's cues for decreased freezing episodes with gait.  Pt and husband feel gait has improved since therapy began.  Discussed importance of continuing HEP, walking on a consistent basis and perhaps looking into participation in additional community fitness activities in their assisted living community.  Disucssed return screen in 6 months.             PT Education - 06/27/18 2219    Education Details  POC, progress towards goals, plans to discharge today with return screen in 6 months    Person(s) Educated  Patient;Spouse    Methods  Explanation;Demonstration    Comprehension  Verbalized understanding          PT Long Term Goals - 06/27/18 1202      PT LONG TERM GOAL #1   Title  Pt will perform HEP with husband's supervision for improved balance, transfers, and gait.  TARGET 06/06/18    Baseline  per patient/husband report    Time  4    Status  Achieved      PT LONG TERM GOAL #2   Title  Pt/husband will verbalize understanding of fall prevention in the home environment, including tips to reduce freezing with gait.    Time  4    Period  Weeks    Status  Achieved      PT LONG TERM GOAL #3   Title  Pt will improve gait velocity to at least 1.8 ft/sec for decreased fall risk    Baseline  1.32 ft/sec 06/27/18    Time  4    Period  Weeks    Status  Achieved      PT LONG TERM GOAL #4   Title  Pt will imporve TUG score to less than or equal to 25 seconds for decreased fall risk.    Baseline  TUG 27.5 06/27/18    Time  4    Period  Weeks    Status  Not Met      PT LONG TERM GOAL #5   Title  Pt will  improved Berg Balance score to at least 28/56 for decreased fall risk.    Time  4    Period  Weeks    Status  Partially Met            Plan - 06/27/18 2220    Clinical Impression Statement  Pt has not been seen for PT since 06/05/18 due to cancelling appointments to hear from orthopedic doctor regarding back tests following a fall.  She returns today (after cancelling yesterday for another appointment), and PT assessed LTGs.  Pt has met LTG 1, 2.  LTG 3 and 4 not met for TUG or gait velocity.  LTG 5 partially met, with slight improvement made in Berg score.  Husband feels he is able to cue her with gait and her walking pattern has improved.  DIscussed POC and progress with PT, including limitations of progressing activities due to pt's significant back pain and pt's scheduling issues.  Pt and husband agree to discharge this visit, with plans for return screen in 6 monhts.  Rehab Potential  Good    Clinical Impairments Affecting Rehab Potential  memory changes-per husband report; arthritis pain in knees, back limiting standing    PT Frequency  2x / week    PT Duration  4 weeks plus eval; additional therapy may be warranted after current session based on progress    PT Treatment/Interventions  ADLs/Self Care Home Management;DME Instruction;Balance training;Therapeutic exercise;Therapeutic activities;Functional mobility training;Gait training;Neuromuscular re-education;Patient/family education    PT Next Visit Plan  Discharge PT; return screen in 6 months.    Consulted and Agree with Plan of Care  Patient;Family member/caregiver       Patient will benefit from skilled therapeutic intervention in order to improve the following deficits and impairments:  Abnormal gait, Decreased balance, Decreased mobility, Decreased safety awareness, Difficulty walking, Decreased strength, Postural dysfunction  Visit Diagnosis: Other abnormalities of gait and mobility  Unsteadiness on feet     Problem  List Patient Active Problem List   Diagnosis Date Noted  . Acute right-sided low back pain 05/22/2018  . Gait abnormality 04/21/2018  . Memory loss 04/21/2018  . Abnormal nuclear stress test   . Hypothyroidism 02/04/2018  . Chronic back pain 02/04/2018  . Osteoporosis without current pathological fracture 02/04/2018  . Recurrent UTI 02/04/2018  . Sjogren's syndrome ([place]) 02/04/2018  . Chronic constipation 02/04/2018  . RBBB   . Aortic stenosis   . Other dysphagia 09/03/2014  . Unspecified hereditary and idiopathic peripheral neuropathy 09/03/2014  . Heart murmur 04/19/2014  . GERD (gastroesophageal reflux disease)   . Hypercholesterolemia   . PVD (peripheral vascular disease) ([place])   . Barrett esophagus   . Chronic diastolic CHF (congestive heart failure) ([place])   . Parkinson's disease ([place]) 08/26/2013  . Swelling of both ankles 11/20/2012  . Fracture of multiple pubic rami ([place]) 07/17/2012  . Hematoma of hip 07/17/2012  . Leukocytosis 07/17/2012  . Hypertension   . Atherosclerosis of native arteries of the extremities with intermittent claudication 05/22/2012    Stepanie Graver W. 06/27/2018, 10:24 PM  Frazier Butt., PT   [facility] [address] [place], [place], [address] Phone: [phone]   Fax:  [phone]  Name: [name] MRN: [id number] Date of Birth: 04/28/1931   PHYSICAL THERAPY DISCHARGE SUMMARY  Visits from Start of Care: 7  Current functional level related to goals / functional outcomes: PT Long Term Goals - 06/27/18 1202      PT LONG TERM GOAL #1   Title  Pt will perform HEP with husband's supervision for improved balance, transfers, and gait.  TARGET 06/06/18    Baseline  per patient/husband report    Time  4    Status  Achieved      PT LONG TERM GOAL #2   Title  Pt/husband will verbalize understanding of fall prevention in the home environment, including tips to reduce  freezing with gait.    Time  4    Period  Weeks    Status  Achieved      PT LONG TERM GOAL #3   Title  Pt will improve gait velocity to at least 1.8 ft/sec for decreased fall risk    Baseline  1.32 ft/sec 06/27/18    Time  4    Period  Weeks    Status  Achieved      PT LONG TERM GOAL #4   Title  Pt will imporve TUG score to less than or equal to 25 seconds for decreased fall risk.  Baseline  TUG 27.5 06/27/18    Time  4    Period  Weeks    Status  Not Met      PT LONG TERM GOAL #5   Title  Pt will improved Berg Balance score to at least 28/56 for decreased fall risk.    Time  4    Period  Weeks    Status  Partially Met      Pt has met 2 of 5 LTGs.   Remaining deficits: Posture, balance, gait, pain(longstanding)   Education / Equipment: Educated in tips to reduce freezing with gait; HEP  Plan: Patient agrees to discharge.  Patient goals were partially met. Patient is being discharged due to being pleased with the current functional level.  ?????Recommend PT screen in 6 months due to progressive nature of disease process.         Mady Haagensen, PT 06/27/18 10:26 PM Phone: [phone] Fax: [phone]

## 2018-06-27 NOTE — Telephone Encounter (Addendum)
This RN received urine culture results from urologist. [name] with husband, Saralyn Pilar and inquired about the patient's previous symptoms he had reported last week. He stated she is recognizing everyone now,  but still has some confusion and forgetfulness at times which is becoming more frequent. He is asking if this is progression of her Parkinson's disease or something else. He stated her rheumatologist took her off Celebrex,  but there are no other medication changes.  This RN advised will discuss with NP and call him back later this afternoon. He asked for a message to be left if he doesn't answer, verbalized understanding, appreciation.

## 2018-06-27 NOTE — Telephone Encounter (Signed)
LVM advising patient's husband, per NP to monitor her symptoms and call for any increase in symptoms or other concerns. Advised him that this could be related to her Parkinson's progression or her depression.  Advised if they have not done so, could discuss with her PCP. Advised him the office is now closed, reopens Mon at 8 am, left number.

## 2018-07-01 DIAGNOSIS — E039 Hypothyroidism, unspecified: Secondary | ICD-10-CM | POA: Diagnosis not present

## 2018-07-01 DIAGNOSIS — I5032 Chronic diastolic (congestive) heart failure: Secondary | ICD-10-CM

## 2018-07-01 DIAGNOSIS — M549 Dorsalgia, unspecified: Secondary | ICD-10-CM | POA: Diagnosis not present

## 2018-07-01 DIAGNOSIS — G8929 Other chronic pain: Secondary | ICD-10-CM | POA: Diagnosis not present

## 2018-07-01 DIAGNOSIS — E7800 Pure hypercholesterolemia, unspecified: Secondary | ICD-10-CM | POA: Diagnosis not present

## 2018-07-01 DIAGNOSIS — I10 Essential (primary) hypertension: Secondary | ICD-10-CM

## 2018-07-01 LAB — COMPLETE METABOLIC PANEL WITH GFR
AG Ratio: 1.8 (calc) (ref 1.0–2.5)
ALBUMIN MSPROF: 4.2 g/dL (ref 3.6–5.1)
ALT: 9 U/L (ref 6–29)
AST: 18 U/L (ref 10–35)
Alkaline phosphatase (APISO): 78 U/L (ref 33–130)
BUN/Creatinine Ratio: 15 (calc) (ref 6–22)
BUN: 15 mg/dL (ref 7–25)
CALCIUM: 9.6 mg/dL (ref 8.6–10.4)
CO2: 25 mmol/L (ref 20–32)
CREATININE: 0.99 mg/dL — AB (ref 0.60–0.88)
Chloride: 108 mmol/L (ref 98–110)
GFR, EST NON AFRICAN AMERICAN: 52 mL/min/{1.73_m2} — AB (ref >=60)
GFR, Est African American: 61 mL/min/{1.73_m2} (ref >=60)
GLOBULIN: 2.3 g/dL (ref 1.9–3.7)
Glucose, Bld: 96 mg/dL (ref 65–99)
Potassium: 4.4 mmol/L (ref 3.5–5.3)
SODIUM: 141 mmol/L (ref 135–146)
Total Bilirubin: 0.4 mg/dL (ref 0.2–1.2)
Total Protein: 6.5 g/dL (ref 6.1–8.1)

## 2018-07-01 LAB — LIPID PANEL
CHOL/HDL RATIO: 2.7 (calc) (ref <5.0)
CHOLESTEROL: 164 mg/dL (ref <200)
HDL: 60 mg/dL (ref >50)
LDL CHOLESTEROL (CALC): 77 mg/dL
Non-HDL Cholesterol (Calc): 104 mg/dL (calc) (ref <130)
Triglycerides: 161 mg/dL — ABNORMAL HIGH (ref <150)

## 2018-07-01 LAB — CBC
HCT: 29.5 % — ABNORMAL LOW (ref 35.0–45.0)
HEMOGLOBIN: 9.6 g/dL — AB (ref 11.7–15.5)
MCH: 28.4 pg (ref 27.0–33.0)
MCHC: 32.5 g/dL (ref 32.0–36.0)
MCV: 87.3 fL (ref 80.0–100.0)
MPV: 9.5 fL (ref 7.5–12.5)
Platelets: 334 10*3/uL (ref 140–400)
RBC: 3.38 10*6/uL — AB (ref 3.80–5.10)
RDW: 12.9 % (ref 11.0–15.0)
WBC: 5.6 10*3/uL (ref 3.8–10.8)

## 2018-07-01 LAB — TSH: TSH: 1.77 mIU/L (ref 0.40–4.50)

## 2018-07-08 ENCOUNTER — Non-Acute Institutional Stay: Payer: Medicare Other | Admitting: Internal Medicine

## 2018-07-08 ENCOUNTER — Encounter: Payer: Self-pay | Admitting: Internal Medicine

## 2018-07-08 VITALS — BP 118/62 | HR 65 | Temp 97.7°F | Resp 18 | Ht 62.0 in | Wt 156.4 lb

## 2018-07-08 DIAGNOSIS — F32A Depression, unspecified: Secondary | ICD-10-CM

## 2018-07-08 DIAGNOSIS — I2510 Atherosclerotic heart disease of native coronary artery without angina pectoris: Secondary | ICD-10-CM

## 2018-07-08 DIAGNOSIS — G20 Parkinson's disease: Secondary | ICD-10-CM | POA: Diagnosis not present

## 2018-07-08 DIAGNOSIS — N390 Urinary tract infection, site not specified: Secondary | ICD-10-CM | POA: Diagnosis not present

## 2018-07-08 DIAGNOSIS — I10 Essential (primary) hypertension: Secondary | ICD-10-CM | POA: Diagnosis not present

## 2018-07-08 DIAGNOSIS — K219 Gastro-esophageal reflux disease without esophagitis: Secondary | ICD-10-CM

## 2018-07-08 DIAGNOSIS — E039 Hypothyroidism, unspecified: Secondary | ICD-10-CM | POA: Diagnosis not present

## 2018-07-08 DIAGNOSIS — F329 Major depressive disorder, single episode, unspecified: Secondary | ICD-10-CM

## 2018-07-08 DIAGNOSIS — R443 Hallucinations, unspecified: Secondary | ICD-10-CM | POA: Diagnosis not present

## 2018-07-08 NOTE — Progress Notes (Signed)
Location:  [facility] (12) Provider:  Blanchie Serve MD  Blanchie Serve, MD  Patient Care Team: Blanchie Serve, MD as PCP - General (Internal Medicine) [name], MD as PCP - Cardiology (Cardiology) [name], MD (Neurosurgery) [name], MD (Neurology) [name], MD as Consulting Physician (Gastroenterology) [name], MD as Consulting Physician (Urology) [name], Man X, NP as Nurse Practitioner (Internal Medicine)  Extended Emergency Contact Information Primary Emergency Contact: [facility] Address: [address]          [place], [place] [address] [name] of [place] Phone: [phone] Mobile Phone: [phone] Relation: Spouse Secondary Emergency Contact: [name] Mobile Phone: [phone] Relation: Daughter Preferred language: [name] Interpreter needed? No   Goals of care: Advanced Directive information Advanced Directives 07/08/2018  Does Patient Have a Medical Advance Directive? Yes  Type of Advance Directive Out of facility DNR (pink MOST or yellow form)  Does patient want to make changes to medical advance directive? No - Patient declined  Copy of [place] in Chart? -  Would patient like information on creating a medical advance directive? -  Pre-existing out of facility DNR order (yellow form or pink MOST form) Yellow form placed in chart (order not valid for inpatient use)     Chief Complaint  Patient presents with  . Medical Management of Chronic Issues    3 month follow up  . Medication Refill    No refills needed at this time  . Results    Discuss labs     HPI:  Pt is a 84 y.o. female seen today for medical management of chronic diseases. She has parkinson's disease and follows with neurology. She is on sinemet and tolerating it well. She has moved to another apartment last month and tells me that she is adjusting to new environment.  She has been having visual and auditory hallucination where she sees her deceased parents and talks with them. No thoughts about harming herself or others. Per husband, no abrupt behavior noted, no agitation reported. She also has history of recurrent UTI and is on trimethoprim for prophylaxis and sees urology. She had urine test done with concern for UTI and infection was ruled out. 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 This bothers her at times with swelling and irritation. This was diagnosed on 08/29/16. Has f/u with dermatology on 07/23/18. She complaints of Right middle finger pain and swelling. Followed by rheumatology. celebrex bid has been stopped due to drop in hemoglobin. Currently on plaquenil 200 mg daily, tylenol and tramadol. Pain has subsided now, denies any redness or swelling to her hand and finger joints this visit.denies reflux symptom, tolerating dexlansoprazole well. Taking her levothyroxine as prescribed. Feels tired these days.    Past Medical History:  Diagnosis Date  . Anemia   . Anxiety   . Aortic stenosis    moderate by echo 2019  . Arthritis   . Broken arm    right   . Chronic diastolic CHF (congestive heart failure) ([place])   . Family history of adverse reaction to anesthesia    pt. states sister vomits  . Fibromyalgia   . Fracture of thumb 09/01/2015   right  . Fracture, foot 09/01/2015   right  . GERD (gastroesophageal reflux disease)   . H/O hiatal hernia   . History of bronchitis   . History of kidney stones   . Hypercholesterolemia   .  Hypertension    dr t turner  . Hypothyroidism   . Neuropathy   . Osteoporosis   . Parkinson disease ([place])   . PVD (peripheral vascular disease) ([facility])    99% stenosis of left anteiror tibial artery, mod stenosis of left distal SFA and popliteal artery followed by [name]  . RBBB    noted on EKG 2018  . Restless leg   . Shortness of breath   . Sjogren's disease ([place])   . SOB (shortness of breath)     chronic due to diastolic dysfunction, deconditioning, obesity  . Tremor    Past Surgical History:  Procedure Laterality Date  . ABDOMINAL HYSTERECTOMY    . BACK SURGERY     4 back surgeries,   lumbar fusion  . CARDIAC CATHETERIZATION  2006   normal  . EYE SURGERY Bilateral    cateracts  . falls     variious fall, broken wrist,and toes  . FRACTURE SURGERY Right April 2016   Wrist, Pt. fell  . RIGHT/LEFT HEART CATH AND CORONARY ANGIOGRAPHY N/A 02/05/2018   Procedure: RIGHT/LEFT HEART CATH AND CORONARY ANGIOGRAPHY;  Surgeon: Troy Sine, MD;  Location: [facility];  Service: Cardiovascular;  Laterality: N/A;  . SPINAL CORD STIMULATOR INSERTION N/A 09/09/2015   Procedure: LUMBAR SPINAL CORD STIMULATOR INSERTION;  Surgeon: Clydell Hakim, MD;  Location: [facility];  Service: Neurosurgery;  Laterality: N/A;  LUMBAR SPINAL CORD STIMULATOR INSERTION  . SPINE SURGERY  April 2013   Back X's 4  . TOTAL HIP ARTHROPLASTY     right  . WRIST FRACTURE SURGERY Bilateral     Allergies  Allergen Reactions  . Sulfa Antibiotics Other (See Comments)    Headache, very sick  . Codeine Other (See Comments)    Reaction:  Headaches and nightmares   . Lactose Intolerance (Gi) Nausea And Vomiting  . Latex Rash  . Lyrica [Pregabalin] Swelling and Other (See Comments)    Reaction:  Leg swelling  . Other Other (See Comments)    Pt states that pain medications give her nightmares.    . Plaquenil [Hydroxychloroquine Sulfate] Other (See Comments)    Reaction:  GI upset   . Reglan [Metoclopramide] Other (See Comments)    Reaction:  GI upset   . Requip [Ropinirole Hcl] Other (See Comments)    Reaction:  GI upset   . Septra [Sulfamethoxazole-Trimethoprim] Nausea And Vomiting  . Shellfish Allergy Nausea And Vomiting    Outpatient Encounter Medications as of 07/08/2018  Medication Sig  . aspirin EC 81 MG tablet Take 81 mg by mouth at bedtime.   . Calcium Carb-Cholecalciferol (CALCIUM 1000 + D)  1000-800 MG-UNIT TABS Take 1 tablet by mouth daily.  . carbidopa-levodopa (SINEMET IR) 25-100 MG tablet Take 2 tablets by mouth 3 (three) times daily.  . Cholecalciferol (VITAMIN D) 2000 units CAPS Take 2,000 Units by mouth daily.  Marland Kitchen denosumab (PROLIA) 60 MG/ML SOLN injection Inject 60 mg into the skin every 6 (six) months. Administer in upper arm, thigh, or abdomen  . dexlansoprazole (DEXILANT) 60 MG capsule Take 60 mg by mouth daily.  . diphenhydramine-acetaminophen (TYLENOL PM) 25-500 MG TABS Take 1 tablet by mouth at bedtime.  . docusate sodium (COLACE) 100 MG capsule Take 100 mg by mouth at bedtime.  . DULoxetine (CYMBALTA) 20 MG capsule Take 20 mg by mouth daily.  Marland Kitchen ezetimibe (ZETIA) 10 MG tablet Take 10 mg by mouth at bedtime.   . hydroxychloroquine (PLAQUENIL) 200 MG  tablet Take 200 mg by mouth daily.   Marland Kitchen levothyroxine (SYNTHROID, LEVOTHROID) 50 MCG tablet Take 50 mcg by mouth daily before breakfast.   . lidocaine (XYLOCAINE) 5 % ointment Apply 2-3 application topically 4 (four) times daily. Apply to affected area/feet  . lovastatin (MEVACOR) 40 MG tablet Take 40 mg by mouth at bedtime.  . metoprolol tartrate (LOPRESSOR) 25 MG tablet TAKE 1 TABLET ONCE DAILY.  . Multiple Vitamin (MULTIVITAMIN WITH MINERALS) TABS tablet Take 1 tablet by mouth daily.  Vladimir Faster Glycol-Propyl Glycol (SYSTANE) 0.4-0.3 % GEL ophthalmic gel Place 1 application into both eyes at bedtime.  Vladimir Faster Glycol-Propyl Glycol (SYSTANE) 0.4-0.3 % SOLN Place 1 drop into both eyes daily.   . pramipexole (MIRAPEX) 1 MG tablet Take 1 tablet (1 mg total) by mouth 3 (three) times daily.  . Skin Protectants, Misc. (EUCERIN) cream Apply 1 application topically daily.   . traMADol (ULTRAM) 50 MG tablet Take 1 tablet (50 mg total) by mouth 3 (three) times daily as needed.  . traMADol (ULTRAM-ER) 200 MG 24 hr tablet Take 1 tablet (200 mg total) by mouth daily.  Marland Kitchen trimethoprim (TRIMPEX) 100 MG tablet Take 100 mg by mouth  daily.  . [DISCONTINUED] celecoxib (CELEBREX) 200 MG capsule Take 200 mg by mouth 2 (two) times daily.   No facility-administered encounter medications on file as of 07/08/2018.     Review of Systems  Constitutional: Positive for fatigue. Negative for appetite change, chills, diaphoresis and fever.  HENT: Positive for hearing loss and mouth sores. Negative for congestion, postnasal drip, rhinorrhea, sore throat and trouble swallowing.   Eyes: Positive for visual disturbance.  Respiratory: Negative for cough, shortness of breath and wheezing.   Cardiovascular: Negative for chest pain and palpitations.  Gastrointestinal: Negative for abdominal pain, constipation, diarrhea, nausea and vomiting.  Genitourinary: Positive for frequency. Negative for dysuria, hematuria and pelvic pain.  Musculoskeletal: Positive for arthralgias, back pain and gait problem.  Skin: Negative for rash and wound.  Neurological: Negative for dizziness, numbness and headaches.  Psychiatric/Behavioral: Positive for confusion, decreased concentration and hallucinations. Negative for sleep disturbance and suicidal ideas. The patient is nervous/anxious.     Immunization History  Administered Date(s) Administered  . Influenza Split 09/09/2013  . Influenza,inj,Quad PF,6+ Mos 09/11/2016  . Tdap 09/01/2015  . Zoster Recombinat (Shingrix) 06/11/2018   Pertinent  Health Maintenance Due  Topic Date Due  . PNA vac Low Risk Adult (1 of 2 - PCV13) 10/01/1998  . INFLUENZA VACCINE  07/10/2018  . DEXA SCAN  Completed   Fall Risk  04/21/2018 11/15/2017 02/13/2017 10/13/2015 03/02/2015  Falls in the past year? No No Yes Yes Yes  Number falls in past yr: - - 2 or more 1 1  Injury with Fall? - - Yes Yes Yes  Comment - - stitches in forehead, fx finger r foot fx, thumb fx -  Risk for fall due to : - - Impaired mobility;Impaired balance/gait (No Data) -  Risk for fall due to: Comment - - - unsure of reason for fall -   Functional  Status Survey:    Vitals:   07/08/18 0834  BP: 118/62  Pulse: 65  Resp: 18  Temp: 97.7 F (36.5 C)  TempSrc: Oral  SpO2: 96%  Weight: 156 lb 6.4 oz (70.9 kg)  Height: 5\' 2"  (1.575 m)   Body mass index is 28.61 kg/m.   Wt Readings from Last 3 Encounters:  07/08/18 156 lb 6.4 oz (70.9 kg)  05/22/18  166 lb 9.6 oz (75.6 kg)  04/21/18 162 lb 9.6 oz (73.8 kg)   Physical Exam  Constitutional: No distress.  Overweight elderly female  HENT:  Head: Normocephalic and atraumatic.  Mouth/Throat: Oropharynx is clear and moist.  Eyes: Pupils are equal, round, and reactive to light. Conjunctivae and EOM are normal.  Neck: Normal range of motion. Neck supple.  Cardiovascular: Normal rate and regular rhythm.  Pulmonary/Chest: Effort normal and breath sounds normal. She has no wheezes. She has no rales.  Abdominal: Soft. Bowel sounds are normal. There is no tenderness. There is no guarding.  Musculoskeletal: She exhibits edema.  Can move all 4 extremities, uses walker, arthritis changes to her hands  Lymphadenopathy:    She has no cervical adenopathy.  Neurological: She is alert.  Oriented to person and place, short attention span, repetitive questions, mask like face expression, no rigidity noted, shuffling gait  Skin: Skin is warm and dry. She is not diaphoretic.  Psychiatric:  Pleasantly confused    Labs reviewed: Recent Labs    01/24/18 1059 02/03/18 1220 07/01/18 0715  NA 139 143 141  K 4.6 4.5 4.4  CL 105 106 108  CO2 20 22 25   GLUCOSE 96 95 96  BUN 20 20 15   CREATININE 0.89 0.77 0.99*  CALCIUM 9.7 9.9 9.6   Recent Labs    01/31/18 0821 07/01/18 0715  AST 32 18  ALT 7 9  ALKPHOS 56  --   BILITOT 0.4 0.4  PROT 6.1 6.5  ALBUMIN 4.2  --    Recent Labs    02/03/18 1220 07/01/18 0715  WBC 7.6 5.6  HGB 10.6* 9.6*  HCT 31.9* 29.5*  MCV 89 87.3  PLT 330 334   Lab Results  Component Value Date   TSH 1.77 07/01/2018   No results found for: HGBA1C Lab  Results  Component Value Date   CHOL 164 07/01/2018   HDL 60 07/01/2018   LDLCALC 77 07/01/2018   TRIG 161 (H) 07/01/2018   CHOLHDL 2.7 07/01/2018    Significant Diagnostic Results in last 30 days:  No results found.   Depression screen PHQ 2/9 07/08/2018  Decreased Interest 2  Down, Depressed, Hopeless 0  PHQ - 2 Score 2  Altered sleeping 0  Tired, decreased energy 3  Change in appetite 3  Feeling bad or failure about yourself  0  Trouble concentrating 2  Moving slowly or fidgety/restless 3  Suicidal thoughts 0  PHQ-9 Score 13    Assessment/Plan  1. Hallucinations Has auditory and visual hallucination. Infectious workup negative. PHQ 9 score of 13.. Concern of lewy body dementia with hallucination along with dementia with parkinson's history. Pt not willing to initiate medication treatment for now, open to psychology counselling. Spoke with facility lead nurse to provide resources for psychology service in nearby area. Would recommend pt to see neurology, called office and appointment made for 8/14. Her recent move with stress causing some depression/ anxiety could be contributing some as well. Advised husband to monitor for worsening symptom. If worsens and pt/ family agrees, will initiate medication for possible delirium associated with dementia. Stable TSH, wbc, CMP. CT head from 01/13/17 reviewed. She is on tramadol for pain management but this is not new medication, no new medication in list on review.   2. Essential hypertension Controlled BP reading. Continue aspirin and lovastatin with metoprolol tartrate  3. Gastroesophageal reflux disease, esophagitis presence not specified Continue dexilant and monitor  4. Hypothyroidism, unspecified type Lab Results  Component Value Date   TSH 1.77 07/01/2018   Stable, continue levothyroxine  5. Parkinson's disease ([place]) Continue sinemet and pramipexole, with hallucination, concern for Lewy Body Dementia. earlier appt scheduled  with neurology service. Patient might need MRI brain to evaluate further.   6. Recurrent UTI Continue trimethoprim, u/a negative in urology clinic  7. Depression PHQ9 13. Pt does not want to initiate antidepressant at present. Agrees to psychology counselling. Possible geripsych evaluation    Family/ staff Communication: reviewed care plan with patient and her husband   Labs/tests ordered:  Reviewed recent lab, advised to see neurology and psychology service.    Blanchie Serve, MD Internal Medicine [facility] [address] [place], [place] [address] Cell Phone (Monday-Friday 8 am - 5 pm): [phone] On Call: [phone] and follow prompts after 5 pm and on weekends Office Phone: [phone] Office Fax: [phone]

## 2018-07-08 NOTE — Patient Instructions (Addendum)
  I would like for you to see your neurologist and psychologist. I would like for you to think about starting a medication for your mood and nerves- antidepressant. If you agree to taking it, call my office and let us know.

## 2018-07-09 ENCOUNTER — Ambulatory Visit: Payer: Self-pay | Admitting: Family

## 2018-07-09 ENCOUNTER — Encounter (HOSPITAL_COMMUNITY): Payer: Self-pay

## 2018-07-10 ENCOUNTER — Encounter: Payer: Self-pay | Admitting: Family

## 2018-07-10 ENCOUNTER — Ambulatory Visit (INDEPENDENT_AMBULATORY_CARE_PROVIDER_SITE_OTHER): Payer: Medicare Other | Admitting: Family

## 2018-07-10 ENCOUNTER — Other Ambulatory Visit: Payer: Self-pay

## 2018-07-10 ENCOUNTER — Ambulatory Visit (HOSPITAL_COMMUNITY)
Admission: RE | Admit: 2018-07-10 | Discharge: 2018-07-10 | Disposition: A | Discharge status: Home / Self Care | Payer: Medicare Other | Source: Ambulatory Visit | Attending: Family | Admitting: Family

## 2018-07-10 VITALS — BP 112/62 | HR 81 | Resp 18 | Ht 62.0 in | Wt 157.5 lb

## 2018-07-10 DIAGNOSIS — I10 Essential (primary) hypertension: Secondary | ICD-10-CM | POA: Diagnosis not present

## 2018-07-10 DIAGNOSIS — I779 Disorder of arteries and arterioles, unspecified: Secondary | ICD-10-CM | POA: Diagnosis not present

## 2018-07-10 DIAGNOSIS — E785 Hyperlipidemia, unspecified: Secondary | ICD-10-CM | POA: Insufficient documentation

## 2018-07-10 NOTE — Progress Notes (Signed)
[facility] [place]   CC: Follow up peripheral artery occlusive disease  History of Present Illness [name] is a 84 y.o. female whom [name] has been montoring for peripheral artery occlusive disease.   She has a history of tight stenosis in her left anterior tibial artery as well as popliteal occlusive disease  She does not have claudication symptoms, her walking seems limited by neurogenic claudication and back issues, had several lumbar spine surgeries.  She denies non-healing wounds.   She had left first ingrown toenail removed about 2012. Her podiatrist is Dr. Paulino Door. She has a remote history of left metatarsal fracture.  She has pins and needles feelings at toes, plantar surfaces, and heels of both feet, mostly at night.  Had 4 lumbar spine surgeries and has what sounds like right sciatic pain at times.   She is seeing a gastroenterologist, states she has a large hiatal hernia.  She fractured her right wrist in March 2016, had ORIF. She developed kidney stones, saw a urologist, Dr. Kendell Bane at [facility].   She has multiple areas of muscoskeletal type pain that she attributes to her Sjogren's Syndrome and Parkinson's Disease.  Dr.Penumalli is her neurologist.   She reports hallucinations in the last 2-3 weeks.  She has been having anemia, her Celebrex was stopped due to this. She is under evaluation for this.  She has a hx of kidney stones, husband states kidney stones found on recent CT in [place].   She fell a couple of months ago in [place], evaluated in an ED there.  She does not feel steady even walking on level ground.   Diabetic: No Tobacco use: non-smoker  Pt meds include:  Statin :Yes Betablocker: Yes ASA: Yes Other anticoagulants/antiplatelets: no   Past Medical History:  Diagnosis Date  . Anemia   . Anxiety   . Aortic stenosis    moderate by echo 2019  . Arthritis   . Broken arm    right   . Chronic diastolic CHF (congestive heart failure) ([place])   . Family history of adverse reaction to anesthesia    pt. states sister vomits  . Fibromyalgia   . Fracture of thumb 09/01/2015   right  . Fracture, foot 09/01/2015   right  . GERD (gastroesophageal reflux disease)   . H/O hiatal hernia   . History of bronchitis   . History of kidney stones   . Hypercholesterolemia   . Hypertension    dr t turner  . Hypothyroidism   . Neuropathy   . Osteoporosis   . Parkinson disease ([place])   . PVD (peripheral vascular disease) ([facility])    99% stenosis of left anteiror tibial artery, mod stenosis of left distal SFA and popliteal artery followed by [name]  . RBBB    noted on EKG 2018  . Restless leg   . Shortness of breath   . Sjogren's disease ([place])   . SOB (shortness of breath)    chronic due to diastolic dysfunction, deconditioning, obesity  . Tremor     Social History Social History   Tobacco Use  . Smoking status: Never Smoker  . Smokeless tobacco: Never Used  Substance Use Topics  . Alcohol use: No    Alcohol/week: 0.0 oz  . Drug use: No    Family History Family History  Problem Relation Age of Onset  . Heart attack Mother   . Restless legs syndrome Mother   . Heart failure  Mother   . Heart disease Mother   . Hypertension Mother   . COPD Father     Past Surgical History:  Procedure Laterality Date  . ABDOMINAL HYSTERECTOMY    . BACK SURGERY     4 back surgeries,   lumbar fusion  . CARDIAC CATHETERIZATION  2006   normal  . EYE SURGERY Bilateral    cateracts  . falls     variious fall, broken wrist,and toes  . FRACTURE SURGERY Right April 2016   Wrist, Pt. fell  . RIGHT/LEFT HEART CATH AND CORONARY ANGIOGRAPHY N/A 02/05/2018   Procedure: RIGHT/LEFT HEART CATH AND CORONARY ANGIOGRAPHY;  Surgeon: Troy Sine, MD;  Location: [facility];  Service: Cardiovascular;  Laterality: N/A;  . SPINAL CORD STIMULATOR INSERTION N/A 09/09/2015    Procedure: LUMBAR SPINAL CORD STIMULATOR INSERTION;  Surgeon: Clydell Hakim, MD;  Location: [facility];  Service: Neurosurgery;  Laterality: N/A;  LUMBAR SPINAL CORD STIMULATOR INSERTION  . SPINE SURGERY  April 2013   Back X's 4  . TOTAL HIP ARTHROPLASTY     right  . WRIST FRACTURE SURGERY Bilateral     Allergies  Allergen Reactions  . Sulfa Antibiotics Other (See Comments)    Headache, very sick  . Codeine Other (See Comments)    Reaction:  Headaches and nightmares   . Lactose Intolerance (Gi) Nausea And Vomiting  . Latex Rash  . Lyrica [Pregabalin] Swelling and Other (See Comments)    Reaction:  Leg swelling  . Other Other (See Comments)    Pt states that pain medications give her nightmares.    . Plaquenil [Hydroxychloroquine Sulfate] Other (See Comments)    Reaction:  GI upset   . Reglan [Metoclopramide] Other (See Comments)    Reaction:  GI upset   . Requip [Ropinirole Hcl] Other (See Comments)    Reaction:  GI upset   . Septra [Sulfamethoxazole-Trimethoprim] Nausea And Vomiting  . Shellfish Allergy Nausea And Vomiting    Current Outpatient Medications  Medication Sig Dispense Refill  . aspirin EC 81 MG tablet Take 81 mg by mouth at bedtime.     . Calcium Carb-Cholecalciferol (CALCIUM 1000 + D) 1000-800 MG-UNIT TABS Take 1 tablet by mouth daily.    . carbidopa-levodopa (SINEMET IR) 25-100 MG tablet Take 2 tablets by mouth 3 (three) times daily. 540 tablet 4  . Cholecalciferol (VITAMIN D) 2000 units CAPS Take 2,000 Units by mouth daily.    Marland Kitchen denosumab (PROLIA) 60 MG/ML SOLN injection Inject 60 mg into the skin every 6 (six) months. Administer in upper arm, thigh, or abdomen    . dexlansoprazole (DEXILANT) 60 MG capsule Take 60 mg by mouth daily.    . diphenhydramine-acetaminophen (TYLENOL PM) 25-500 MG TABS Take 1 tablet by mouth at bedtime.    . docusate sodium (COLACE) 100 MG capsule Take 100 mg by mouth at bedtime.    . DULoxetine (CYMBALTA) 20 MG capsule Take 20 mg by  mouth daily.    Marland Kitchen ezetimibe (ZETIA) 10 MG tablet Take 10 mg by mouth at bedtime.     . hydroxychloroquine (PLAQUENIL) 200 MG tablet Take 200 mg by mouth daily.   0  . levothyroxine (SYNTHROID, LEVOTHROID) 50 MCG tablet Take 50 mcg by mouth daily before breakfast.     . lovastatin (MEVACOR) 40 MG tablet Take 40 mg by mouth at bedtime.    . metoprolol tartrate (LOPRESSOR) 25 MG tablet TAKE 1 TABLET ONCE DAILY. 90 tablet 2  .  Multiple Vitamin (MULTIVITAMIN WITH MINERALS) TABS tablet Take 1 tablet by mouth daily.    Vladimir Faster Glycol-Propyl Glycol (SYSTANE) 0.4-0.3 % GEL ophthalmic gel Place 1 application into both eyes at bedtime.    Vladimir Faster Glycol-Propyl Glycol (SYSTANE) 0.4-0.3 % SOLN Place 1 drop into both eyes daily.     . pramipexole (MIRAPEX) 1 MG tablet Take 1 tablet (1 mg total) by mouth 3 (three) times daily. 270 tablet 4  . Skin Protectants, Misc. (EUCERIN) cream Apply 1 application topically daily.     . traMADol (ULTRAM) 50 MG tablet Take 1 tablet (50 mg total) by mouth 3 (three) times daily as needed. 30 tablet 0  . traMADol (ULTRAM-ER) 200 MG 24 hr tablet Take 1 tablet (200 mg total) by mouth daily. 30 tablet 0  . trimethoprim (TRIMPEX) 100 MG tablet Take 100 mg by mouth daily.    Marland Kitchen lidocaine (XYLOCAINE) 5 % ointment Apply 2-3 application topically 4 (four) times daily. Apply to affected area/feet (Patient not taking: Reported on 07/10/2018) 35.44 g 12   No current facility-administered medications for this visit.     ROS: See HPI for pertinent positives and negatives.   Physical Examination  Vitals:   07/10/18 1158  BP: 112/62  Pulse: 81  Resp: 18  SpO2: 97%  Weight: 157 lb 8 oz (71.4 kg)  Height: 5\' 2"  (1.575 m)   Body mass index is 28.81 kg/m.  General: A&O x 3, WDWN, generalized pallor, pale conjunctiva. Gait: slow, steady, using rolling walker HENT: No gross abnormalities. Dry lips and tongue  Eyes: PERRLA. Pulmonary: Respirations are non labored, CTAB, fair  air movement in all fields Cardiac: regular rhythm with occasional missed contraction, +murmur.         Carotid Bruits Right Left   Negative Negative   Radial pulses are 1+ palpable bilaterally   Adominal aortic pulse is not palpable                         VASCULAR EXAM: Extremities without ischemic changes, without Gangrene; without open wounds. Trace non pitting edema in ankles.                                                                                                           LE Pulses Right Left       FEMORAL  not palpable  not palpable        POPLITEAL  not palpable   not palpable       POSTERIOR TIBIAL  not palpable   not palpable        DORSALIS PEDIS      ANTERIOR TIBIAL not palpable  not palpable    Abdomen: soft, NT, no palpable masses. Skin: no rashes, no cellulitis, no ulcers noted. Musculoskeletal: no muscle wasting or atrophy. Moderate thoracic kyphosis. Arthritic changes in hands.  Neurologic: A&O X 3; appropriate affect, Sensation is normal; MOTOR FUNCTION:  moving all extremities equally, motor strength 5/5 throughout. Speech is fluent/normal. CN 2-12 intact except has some  hearing loss. Psychiatric: Thought content is normal, mood appropriate for clinical situation.    ASSESSMENT: CHASTA DESHPANDE is a 84 y.o. female who has a history of tight stenosis in her left anterior tibial artery as well as popliteal occlusive disease  She does not have claudication symptoms, her walking seems limited by neurogenic claudication and back issues, had several lumbar spine surgeries.   There are no signs of ischemia in her feet/legs.  She appears pale compared to the last time I saw her a year ago, seems weaker.    DATA  ABI (Date: 07/10/2018):  R:   ABI: 1.19 (was 1.26 on 07-04-17),   PT: bi  DP: tri  TBI:  0.77 (was 0.79)  L:   ABI: 1.35 (was 1.53),   PT: bi  DP: tri  TBI: 0.69 (was 0.88)  Bilateral ABI and TBI remain normal with bi and  triphasic waveforms.      PLAN:  Daily seated leg exercises as discussed and demonstrated.  Based on the patient's vascular studies and examination, pt will return to clinic in 1 year with ABI's.  At husband request, will try to coordiate her and her husband's follow up to be on the same day in a year.  I advised pt and her husband to notify us if she develops concerns re the circulation in her feet or legs.    I discussed in depth with the patient the nature of atherosclerosis, and emphasized the importance of maximal medical management including strict control of blood pressure, blood glucose, and lipid levels, obtaining regular exercise, and continuedcessation of smoking.  The patient is aware that without maximal medical management the underlying atherosclerotic disease process will progress, limiting the benefit of any interventions.  The patient was given information about PAD including signs, symptoms, treatment, what symptoms should prompt the patient to seek immediate medical care, and risk reduction measures to take.  Clemon Chambers, RN, MSN, FNP-C [facility] Phone: [phone]  Clinic MD: Oneida Alar  07/10/18 12:00 PM

## 2018-07-10 NOTE — Patient Instructions (Signed)

## 2018-07-16 NOTE — Progress Notes (Signed)
[facility]  PATIENT: [name] DOB: 04-02-1931   REASON FOR VISIT: Follow-up for Parkinson's disease mild memory loss HISTORY FROM: Patient and husband    HISTORY OF PRESENT ILLNESS:UPDATE 8/12/2019CM [name], 84 year old female returns for follow-up with history of Parkinson's disease and mild memory loss.  Her primary care wanted her to be evaluated for  increased confusion and hallucinations.  Patient had a fall the first of July, slipped and hit the back of her head.  She claims she has had increased confusion and hallucinations since that time.  She is seeing her dead parents and talking with them.  She is also thinking her husband is her father.  She claims she has not had any hallucinations in the last week.  Her primary care Dr. Guinevere Ferrari reports stable TSH WBC CMP.  There are no new medications on her medication list.  Concern for Lewy body dementia along with patient's history of Parkinson's disease.  Patient also has some depression which may be contributing to her memory difficulties.  She ambulates with a rolling walker.  She has also had a recent move and her skilled facility to a different floor.  She returns for reevaluation    UPDATE 04/21/2018 CM [name], 84 year old female returns for follow-up with a history of Parkinson's disease and mild memory loss.  She reports she has difficulty standing to walk some time and sometimes her legs do not want to go she shuffle.  She has not had any physical therapy in quite some time she reports.  She ambulates with a rolling walker.  She denies any falls she denies side effects to her Sinemet and Mirapex.  Memory score 26 out of 30. ADLs 6 pt is independent IADLs 4 low function.  She no longer drives.  She returns for reevaluation   UPDATE (11/15/17, VRP): Since last visit, doing about the same. Tolerating meds. No alleviating or aggravating factors. Memory and gait continue to be issues. Planning to go to [place]  next spring.   UPDATE 02/13/17: Since last visit, feels stable. Tolerating meds. Some memory issues, esp when tired.   UPDATE 08/17/16: Since last visit, continues with intermittent shaking sens on "inside". Sometimes forgets the mid day dose of carb/levo.  UPDATE 02/16/16: Since last visit, doing well. Memory loss stable. Having some pain in feet. Now has spinal cord stim for back pain issues.   UPDATE 10/13/15: Since last visit, more memory loss problems. Forgetting order when playing bridge. Forgetting recent conversations and names. Had fall in Aug 2016, with right foot fracture.   UPDATE 07/06/15: More issues with kidney stones, UTIs, shoulder pain. No wearing off. No tremor. More balance and freezing issues. Short term memory problems continue. Patient is in denial about need to use walker and her own memory problems.   UPDATE 03/02/15: Since last visit, patient's balance has worsened. She fell recently (02/26/15), with right non-displaced distal right radial fracture. Notes more tremor and wearing off around 4pm everyday. Currently on carb/levo 1.5tab TID and pramipexole 1mg  TID. Sometimes takes a 4th dose of carb/levo 1.5tabs. Had been using a walker, mainly at night time, but did not use at time of fall.  UPDATE 09/03/14: Since last visit, tremor is well controlled. Gait and freezing are stable,. Doing well on (carb/levo 1.5 tabs + pramipexole 1mg ) taken TID (sometimes QID, with extra late evening dose if wearing off is notable). Having more foot pain, numbness, drooling prob, flushing, anxiety. Saw psychology last visit, then rec  to see psychiatry, but sxs improved and the psychiatrist who was recommended to them was not taking new patients.   UPDATE 02/24/14: Since last visit, has increased carb/levo up to 1 tab 4x per day; has also incr pramipexole up to 1mg  4x per day (for convenience of remembering, even though we normally dose BID or TID). Also with more anxiety, decr confidence, more  tremor in right upper ext. Also reports some right shoulder, bilateral knee, low back pain. Has noticed some freezing of gait. Has appt with psychology later today.  UPDATE 08/26/13: Since last visit, noticing some wearing off in early evening. Takes carb/levo 6:30am, noon, 9pm. Also getting more "worked up" lately, reading about PD online. No falls. Uses cane and walker. More fatigue and mental exhaustion.  UPDATE 02/04/13: Since last visit, has had some minor falls. tremor and PD sxs better on carb/levo. No anxiety. Some drooling and hypophonia. Not using walker or cane all the time. constipation stable.  UPDATE 10/06/12: Since last visit, had another back surgery (april 2013), dx'd with CHF, dx'd with sjogrens, also with fall in August 2013 with pelvic fracture. Reports good tremor control. Some freezing of gait.  UPDATE 02/13/12: Now on pramipexole 1mg  QID (7am, noon, 5pm, 10pm). Some wearing off around 5pm. Also, had rxn to forteo, now with right leg pain. Also getting lumbar radiculopathy eval per ortho.  UPDATE 08/14/11: Tremor developing around 4pm now.  More anxiety per husband.  Also with UTI x 2.  Now dx'd with left foot fractures (? "stubbed" toes; no falls).  UPDATE 04/27/11: Tolerating pramipexole 1mg  TID (7am, 1-2pm, 7p).  some wearing off right at 7p.  feels unsteady, and has ongoing back problems and pain.  UPDATE 01/24/11: Doing slightly better with pramipexole 1mg  BID.  Tremor returns later in the day before evening dose.  Still with gait and balance diff.    PRIOR HPI: 84 year old female with history of hypertension, hypercholesterolemia, hypothyroidism, fibromyalgia, restless leg syndrome, presenting for evaluation of tremor for the past 3 years. She is here with her husband. Approximately 3 years ago patient developed intermittent tremor in her left hand particularly in her thumb and fingers.  Tremor is most noticeable at rest and in the evening.  In June 2011, patient tripped,  fell and broke her left wrist and forearm.  This was repaired surgically and in a cast; during this time and since then her tremor has improved. Patient also has history of restless leg syndrome for the past 5 years. She's been on pramipexole for number of years (0.5mg  qhs) and recently increased to 1 mg qhs a few weeks ago.  The patient's mother also had restless leg syndrome. Patient has a diagnosis of iron deficiency anemia but is not on iron replacement.     REVIEW OF SYSTEMS: Full 14 system review of systems performed and notable only for those listed, all others are neg:  Constitutional: neg  Cardiovascular: neg Ear/Nose/Throat: Hearing loss Skin: neg Eyes: Light sensitivity Respiratory: neg Gastroitestinal: Constipation,  Hematology/Lymphatic: Anemia Endocrine: neg Musculoskeletal: Walking difficulty,  Allergy/Immunology: neg Neurological: Memory loss,  Psychiatric: Depression anxiety, hallucinations confusion Sleep : neg   ALLERGIES: Allergies  Allergen Reactions  . Sulfa Antibiotics Other (See Comments)    Headache, very sick  . Codeine Other (See Comments)    Reaction:  Headaches and nightmares   . Lactose Intolerance (Gi) Nausea And Vomiting  . Latex Rash  . Lyrica [Pregabalin] Swelling and Other (See Comments)    Reaction:  Leg  swelling  . Other Other (See Comments)    Pt states that pain medications give her nightmares.    . Plaquenil [Hydroxychloroquine Sulfate] Other (See Comments)    Reaction:  GI upset   . Reglan [Metoclopramide] Other (See Comments)    Reaction:  GI upset   . Requip [Ropinirole Hcl] Other (See Comments)    Reaction:  GI upset   . Septra [Sulfamethoxazole-Trimethoprim] Nausea And Vomiting  . Shellfish Allergy Nausea And Vomiting    HOME MEDICATIONS: Outpatient Medications Prior to Visit  Medication Sig Dispense Refill  . aspirin EC 81 MG tablet Take 81 mg by mouth at bedtime.     . Calcium Carb-Cholecalciferol (CALCIUM 1000 + D)  1000-800 MG-UNIT TABS Take 1 tablet by mouth daily.    . carbidopa-levodopa (SINEMET IR) 25-100 MG tablet Take 2 tablets by mouth 3 (three) times daily. 540 tablet 4  . Cholecalciferol (VITAMIN D) 2000 units CAPS Take 2,000 Units by mouth daily.    Marland Kitchen denosumab (PROLIA) 60 MG/ML SOLN injection Inject 60 mg into the skin every 6 (six) months. Administer in upper arm, thigh, or abdomen    . dexlansoprazole (DEXILANT) 60 MG capsule Take 60 mg by mouth daily.    . diphenhydramine-acetaminophen (TYLENOL PM) 25-500 MG TABS Take 1 tablet by mouth at bedtime.    . docusate sodium (COLACE) 100 MG capsule Take 100 mg by mouth at bedtime.    . DULoxetine (CYMBALTA) 20 MG capsule Take 20 mg by mouth daily.    Marland Kitchen ezetimibe (ZETIA) 10 MG tablet Take 10 mg by mouth at bedtime.     . hydroxychloroquine (PLAQUENIL) 200 MG tablet Take 200 mg by mouth daily.   0  . levothyroxine (SYNTHROID, LEVOTHROID) 50 MCG tablet Take 50 mcg by mouth daily before breakfast.     . lovastatin (MEVACOR) 40 MG tablet Take 40 mg by mouth at bedtime.    . metoprolol tartrate (LOPRESSOR) 25 MG tablet TAKE 1 TABLET ONCE DAILY. 90 tablet 2  . Multiple Vitamin (MULTIVITAMIN WITH MINERALS) TABS tablet Take 1 tablet by mouth daily.    Vladimir Faster Glycol-Propyl Glycol (SYSTANE) 0.4-0.3 % GEL ophthalmic gel Place 1 application into both eyes at bedtime.    Vladimir Faster Glycol-Propyl Glycol (SYSTANE) 0.4-0.3 % SOLN Place 1 drop into both eyes daily.     . pramipexole (MIRAPEX) 1 MG tablet Take 1 tablet (1 mg total) by mouth 3 (three) times daily. 270 tablet 4  . Skin Protectants, Misc. (EUCERIN) cream Apply 1 application topically daily.     . traMADol (ULTRAM) 50 MG tablet Take 1 tablet (50 mg total) by mouth 3 (three) times daily as needed. 30 tablet 0  . traMADol (ULTRAM-ER) 200 MG 24 hr tablet Take 1 tablet (200 mg total) by mouth daily. 30 tablet 0  . trimethoprim (TRIMPEX) 100 MG tablet Take 100 mg by mouth daily.    Marland Kitchen lidocaine  (XYLOCAINE) 5 % ointment Apply 2-3 application topically 4 (four) times daily. Apply to affected area/feet (Patient not taking: Reported on 07/21/2018) 35.44 g 12   No facility-administered medications prior to visit.     PAST MEDICAL HISTORY: Past Medical History:  Diagnosis Date  . Anemia   . Anxiety   . Aortic stenosis    moderate by echo 2019  . Arthritis   . Broken arm    right   . Chronic diastolic CHF (congestive heart failure) ([place])   . Family history of adverse reaction to anesthesia  pt. states sister vomits  . Fibromyalgia   . Fracture of thumb 09/01/2015   right  . Fracture, foot 09/01/2015   right  . GERD (gastroesophageal reflux disease)   . H/O hiatal hernia   . History of bronchitis   . History of kidney stones   . Hypercholesterolemia   . Hypertension    dr t turner  . Hypothyroidism   . Neuropathy   . Osteoporosis   . Parkinson disease ([place])   . PVD (peripheral vascular disease) ([facility])    99% stenosis of left anteiror tibial artery, mod stenosis of left distal SFA and popliteal artery followed by [name]  . RBBB    noted on EKG 2018  . Restless leg   . Shortness of breath   . Sjogren's disease ([place])   . SOB (shortness of breath)    chronic due to diastolic dysfunction, deconditioning, obesity  . Tremor     PAST SURGICAL HISTORY: Past Surgical History:  Procedure Laterality Date  . ABDOMINAL HYSTERECTOMY    . BACK SURGERY     4 back surgeries,   lumbar fusion  . CARDIAC CATHETERIZATION  2006   normal  . EYE SURGERY Bilateral    cateracts  . falls     variious fall, broken wrist,and toes  . FRACTURE SURGERY Right April 2016   Wrist, Pt. fell  . RIGHT/LEFT HEART CATH AND CORONARY ANGIOGRAPHY N/A 02/05/2018   Procedure: RIGHT/LEFT HEART CATH AND CORONARY ANGIOGRAPHY;  Surgeon: Troy Sine, MD;  Location: [facility];  Service: Cardiovascular;  Laterality: N/A;  . SPINAL CORD STIMULATOR INSERTION N/A 09/09/2015   Procedure: LUMBAR  SPINAL CORD STIMULATOR INSERTION;  Surgeon: Clydell Hakim, MD;  Location: [facility];  Service: Neurosurgery;  Laterality: N/A;  LUMBAR SPINAL CORD STIMULATOR INSERTION  . SPINE SURGERY  April 2013   Back X's 4  . TOTAL HIP ARTHROPLASTY     right  . WRIST FRACTURE SURGERY Bilateral     FAMILY HISTORY: Family History  Problem Relation Age of Onset  . Heart attack Mother   . Restless legs syndrome Mother   . Heart failure Mother   . Heart disease Mother   . Hypertension Mother   . COPD Father     SOCIAL HISTORY: Social History   Socioeconomic History  . Marital status: Married    Spouse name: [name]  . Number of children: 2  . Years of education: HS  . Highest education level: Not on file  Occupational History    Comment: Homemaker  Social Needs  . Financial resource strain: Not on file  . Food insecurity:    Worry: Not on file    Inability: Not on file  . Transportation needs:    Medical: Not on file    Non-medical: Not on file  Tobacco Use  . Smoking status: Never Smoker  . Smokeless tobacco: Never Used  Substance and Sexual Activity  . Alcohol use: No    Alcohol/week: 0.0 standard drinks  . Drug use: No  . Sexual activity: Never  Lifestyle  . Physical activity:    Days per week: Not on file    Minutes per session: Not on file  . Stress: Not on file  Relationships  . Social connections:    Talks on phone: Not on file    Gets together: Not on file    Attends religious service: Not on file    Active member of club or organization: Not  on file    Attends meetings of clubs or organizations: Not on file    Relationship status: Not on file  . Intimate partner violence:    Fear of current or ex partner: Not on file    Emotionally abused: Not on file    Physically abused: Not on file    Forced sexual activity: Not on file  Other Topics Concern  . Not on file  Social History Narrative   Patient lives at home with her spouse. Married in 1957. Two people  lives in the home, no pets.    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    Caffeine Use: tea     PHYSICAL EXAM  Vitals:   07/21/18 1531  BP: 116/62  Pulse: 73  Weight: 158 lb 9.6 oz (71.9 kg)  Height: 5\' 2"  (1.575 m)   Body mass index is 29.01 kg/m.  Generalized: Well developed, obese female in  no acute distress , well-groomed, mild masking of the face Head: normocephalic and atraumatic,. Oropharynx benign  Neck: Supple,  Cardiac: Regular rate rhythm, no murmur  Musculoskeletal: No deformity   Neurological examination   Mentation: Alert. AFT 10. Clock drawing 4/4 MMSE - Mini Mental State Exam 07/21/2018 04/21/2018 04/08/2018  Not completed: - - (No Data)  Orientation to time 3 3 4   Orientation to Place 5 5 5   Registration 3 3 3   Attention/ Calculation 5 5 5   Recall 2 2 1   Language- name 2 objects 2 2 2   Language- repeat 1 1 1   Language- follow 3 step command 3 3 3   Language- read & follow direction 1 1 1   Write a sentence 1 1 1   Copy design 0 0 1  Total score 26 26 27     Follows all commands speech and language fluent.   Cranial nerve II-XII: Pupils were equal round reactive to light extraocular movements were full, visual field were full on confrontational test. Facial sensation and strength were normal. hearing was intact to finger rubbing bilaterally. Uvula tongue midline. head turning and shoulder shrug were normal and symmetric.Tongue protrusion into cheek strength was normal. Motor: normal bulk and tone, 4/5 strength in  the upper and lower extremity, mild bradykinesia in both upper extremitiesy, no resting  or postural tremor seen. Sensory: normal and symmetric to light touch,  Coordination: finger-nose-finger, heel-to-shin bilaterally, slow movement overall  Reflexes: Brachioradialis 2/2, biceps 2/2, triceps 2/2, patellar absent , Achilles absent,  plantar responses were flexor bilaterally. Gait and Station:  Rising up from seated position with push off and 1+assist,  ambulates with rolling walker, no freezing spells noted, no difficulty with turns  DIAGNOSTIC DATA (LABS, IMAGING, TESTING) - I reviewed patient records, labs, notes, testing and imaging myself where available.  Lab Results  Component Value Date   WBC 5.6 07/01/2018   HGB 9.6 (L) 07/01/2018   HCT 29.5 (L) 07/01/2018   MCV 87.3 07/01/2018   PLT 334 07/01/2018      Component Value Date/Time   NA 141 07/01/2018 0715   NA 143 02/03/2018 1220   K 4.4 07/01/2018 0715   CL 108 07/01/2018 0715   CO2 25 07/01/2018 0715   GLUCOSE 96 07/01/2018 0715   BUN 15 07/01/2018 0715   BUN 20 02/03/2018 1220   CREATININE 0.99 (H) 07/01/2018 0715   CALCIUM 9.6 07/01/2018 0715   PROT 6.5 07/01/2018 0715   PROT 6.1 01/31/2018 0821   ALBUMIN  4.2 01/31/2018 0821   AST 18 07/01/2018 0715   ALT 9 07/01/2018 0715   ALKPHOS 56 01/31/2018 0821   BILITOT 0.4 07/01/2018 0715   BILITOT 0.4 01/31/2018 0821   GFRNONAA 52 (L) 07/01/2018 0715   GFRAA 61 07/01/2018 0715   Lab Results  Component Value Date   CHOL 164 07/01/2018   HDL 60 07/01/2018   LDLCALC 77 07/01/2018   TRIG 161 (H) 07/01/2018   CHOLHDL 2.7 07/01/2018    Lab Results  Component Value Date   TSH 1.77 07/01/2018      ASSESSMENT AND PLAN 84 y.o. female with hypertension, hypercholesterolemia, hypothyroidism, fibromyalgia, restless leg syndrome and parkinson's disease.  Continues with shoulder pain, foot pain, memory loss and gait difficulty, likely related to progression of parkinson's disease.Neuropathy due to Sjogren's syndrome [facility]) Patient has also had mild memory loss however her memory score is unchanged.  She had a fall 6 weeks ago and hit the back of her head then developed  increased confusion and hallucinations.  Stable TSH WBC CMP.  PLAN: Continue pramipexole 1mg  three time per day Continue carb/levo to 2 tabs three times per day Continue using rolling walker  at all times;  MRI of the brain increased confusion hallucinations and recent fall stay active and continue stimulating activities for memory Memory score is stable 26/30  F/U as already planned Dennie Bible, [facility], [facility], [facility] [address] [place], [place] [address] [phone]

## 2018-07-21 ENCOUNTER — Ambulatory Visit (INDEPENDENT_AMBULATORY_CARE_PROVIDER_SITE_OTHER): Payer: Medicare Other | Admitting: Nurse Practitioner

## 2018-07-21 VITALS — BP 116/62 | HR 73 | Ht 62.0 in | Wt 158.6 lb

## 2018-07-21 DIAGNOSIS — R269 Unspecified abnormalities of gait and mobility: Secondary | ICD-10-CM | POA: Diagnosis not present

## 2018-07-21 DIAGNOSIS — R413 Other amnesia: Secondary | ICD-10-CM

## 2018-07-21 DIAGNOSIS — G20 Parkinson's disease: Secondary | ICD-10-CM | POA: Diagnosis not present

## 2018-07-21 DIAGNOSIS — Z79899 Other long term (current) drug therapy: Secondary | ICD-10-CM | POA: Diagnosis not present

## 2018-07-21 DIAGNOSIS — Z9181 History of falling: Secondary | ICD-10-CM | POA: Diagnosis not present

## 2018-07-21 DIAGNOSIS — R441 Visual hallucinations: Secondary | ICD-10-CM

## 2018-07-21 DIAGNOSIS — I779 Disorder of arteries and arterioles, unspecified: Secondary | ICD-10-CM

## 2018-07-21 HISTORY — DX: Visual hallucinations: R44.1

## 2018-07-21 NOTE — Patient Instructions (Signed)
Continue pramipexole 1mg  three time per day Continue carb/levo to 2 tabs three times per day Continue using rolling walker at all times;  MRI of the brain increased confusion hallucinations  stay active and continue stimulating activities for memory Memory score is stable  F/U as already planned

## 2018-07-22 ENCOUNTER — Telehealth: Payer: Self-pay | Admitting: Nurse Practitioner

## 2018-07-22 DIAGNOSIS — M3500 Sicca syndrome, unspecified: Secondary | ICD-10-CM | POA: Diagnosis not present

## 2018-07-22 DIAGNOSIS — M545 Low back pain: Secondary | ICD-10-CM | POA: Diagnosis not present

## 2018-07-22 DIAGNOSIS — M154 Erosive (osteo)arthritis: Secondary | ICD-10-CM | POA: Diagnosis not present

## 2018-07-22 DIAGNOSIS — E669 Obesity, unspecified: Secondary | ICD-10-CM | POA: Diagnosis not present

## 2018-07-22 DIAGNOSIS — Z6833 Body mass index (BMI) 33.0-33.9, adult: Secondary | ICD-10-CM | POA: Diagnosis not present

## 2018-07-22 DIAGNOSIS — M2550 Pain in unspecified joint: Secondary | ICD-10-CM | POA: Diagnosis not present

## 2018-07-22 DIAGNOSIS — D649 Anemia, unspecified: Secondary | ICD-10-CM | POA: Diagnosis not present

## 2018-07-22 NOTE — Telephone Encounter (Signed)
Medicare/BCBS supp order sent to [facility]. No auth they will reach out to the pt to schedule.  °

## 2018-07-22 NOTE — Progress Notes (Signed)
I reviewed note and agree with plan.   [name], MD 0/87/1994, 12:90 AM Certified in Neurology, Neurophysiology and Neuroimaging  [facility] [address], [place], [place] [address] [phone]

## 2018-07-23 ENCOUNTER — Telehealth: Payer: Self-pay | Admitting: Nurse Practitioner

## 2018-07-23 ENCOUNTER — Encounter: Payer: Self-pay | Admitting: Internal Medicine

## 2018-07-23 DIAGNOSIS — L578 Other skin changes due to chronic exposure to nonionizing radiation: Secondary | ICD-10-CM | POA: Diagnosis not present

## 2018-07-23 MED ORDER — ALPRAZOLAM 0.5 MG PO TABS
ORAL_TABLET | ORAL | 0 refills | Status: DC
Start: 2018-07-23 — End: 2018-10-29

## 2018-07-23 NOTE — Telephone Encounter (Signed)
Please let patient know that order for Xanax prior to MRI has been called to her pharmacy.

## 2018-07-23 NOTE — Telephone Encounter (Signed)
LMVM for pt that prescription for xanax faxed to [facility] (for her MRI). She is to call back as needed.

## 2018-07-23 NOTE — Telephone Encounter (Signed)
Pt's husband ret'd RN's call. Msg relayed, he understood and was appreciative

## 2018-07-23 NOTE — Telephone Encounter (Signed)
Fax confirmation received xanax  [facility] [phone].

## 2018-07-24 NOTE — Telephone Encounter (Signed)
Noted  

## 2018-07-24 NOTE — Telephone Encounter (Signed)
This patient saw Hoyle Sauer. She order a MRI but because she has a pain stimulator she cannot have her MRI at [facility]. When you get a chance can you put a new order in for [facility].

## 2018-07-28 ENCOUNTER — Other Ambulatory Visit: Payer: Self-pay | Admitting: *Deleted

## 2018-07-28 ENCOUNTER — Telehealth: Payer: Self-pay | Admitting: Nurse Practitioner

## 2018-07-28 DIAGNOSIS — G20 Parkinson's disease: Secondary | ICD-10-CM

## 2018-07-28 DIAGNOSIS — R413 Other amnesia: Secondary | ICD-10-CM

## 2018-07-28 NOTE — Telephone Encounter (Signed)
Pt husband(on DPR-[place]als[place],Patrick [phone]) has called stating the place where pt was initially going to have MRI said they are unable to do because of devse in pt back and that pt would be referred back to [facility].  Husband is asking for a call re: when MRI will be scheduled

## 2018-07-28 NOTE — Telephone Encounter (Signed)
When you get a chance can you put a new order in for the MRI.

## 2018-07-28 NOTE — Telephone Encounter (Signed)
When you get a chance can you put a new MRI order in for [name] because it is marked for [facility] and [name] will not let me schedule it until it is switched.

## 2018-07-29 ENCOUNTER — Telehealth: Payer: Self-pay | Admitting: Radiology

## 2018-07-29 ENCOUNTER — Other Ambulatory Visit (HOSPITAL_COMMUNITY): Payer: Self-pay

## 2018-07-29 DIAGNOSIS — R2689 Other abnormalities of gait and mobility: Secondary | ICD-10-CM | POA: Diagnosis not present

## 2018-07-29 DIAGNOSIS — M25511 Pain in right shoulder: Secondary | ICD-10-CM | POA: Diagnosis not present

## 2018-07-29 DIAGNOSIS — M6281 Muscle weakness (generalized): Secondary | ICD-10-CM | POA: Diagnosis not present

## 2018-07-29 NOTE — Telephone Encounter (Signed)
I spoke to the patient husband and informed me that I sent all the information to [facility] about her pain stimulator. Charmella stated that she will send it to the MRI tech at [facility] to review it and then will contact the patient if everything is okay.

## 2018-07-29 NOTE — Telephone Encounter (Signed)
Per [facility] MRI Leads are unsafe for MRI. Please advise.

## 2018-07-29 NOTE — Telephone Encounter (Signed)
I spoke to the patient husband and informed me that I sent all the information to [facility] about her pain stimulator. Charmella stated that she will send it to the MRI tech at [facility] to review it and then will contact the patient if everything is okay.

## 2018-07-31 NOTE — Addendum Note (Signed)
Addended by: Minna Antis on: 07/31/2018 10:25 AM   Modules accepted: Orders

## 2018-07-31 NOTE — Telephone Encounter (Signed)
Per Dr Gladstone Lighter verbal order, CT head without contrast ordered, to be done at [place].

## 2018-08-01 NOTE — Telephone Encounter (Signed)
Pt husband(on DPR-[place],Patrick @ [phone]) has called he states he was told by RN Stanton Kidney C that if they haven't heard from [facility] in a couple of days re: the MRI being scheduled to call back into make it known.  Pt husband states there has been no call from [facility] so he is asking to be called about getting pt scheduled

## 2018-08-01 NOTE — Telephone Encounter (Addendum)
[name] I have called and spoke to patient and his wife and relayed I would get you follow up with them on Monday . Patient and his wife were fine with this.

## 2018-08-04 DIAGNOSIS — M25511 Pain in right shoulder: Secondary | ICD-10-CM | POA: Diagnosis not present

## 2018-08-04 DIAGNOSIS — M6281 Muscle weakness (generalized): Secondary | ICD-10-CM | POA: Diagnosis not present

## 2018-08-04 DIAGNOSIS — R2689 Other abnormalities of gait and mobility: Secondary | ICD-10-CM | POA: Diagnosis not present

## 2018-08-04 NOTE — Telephone Encounter (Signed)
Left a voicemail on patient home phone stating that the MRI brain has now been switched to a CT Head.. And that [facility] has tried to reach out to them to get the CT schedule. I did leave GI phone number of [phone] to give them a call to schedule.

## 2018-08-05 NOTE — Telephone Encounter (Signed)
Patient CT is scheduled for 08/08/18 at [facility].   Patient Pain stimulator  Information is: Manufacturer: [employer] coach # [id number] Serial: [id number] Implant date: 09/09/2015.

## 2018-08-07 DIAGNOSIS — M6281 Muscle weakness (generalized): Secondary | ICD-10-CM | POA: Diagnosis not present

## 2018-08-07 DIAGNOSIS — M25511 Pain in right shoulder: Secondary | ICD-10-CM | POA: Diagnosis not present

## 2018-08-07 DIAGNOSIS — R2689 Other abnormalities of gait and mobility: Secondary | ICD-10-CM | POA: Diagnosis not present

## 2018-08-08 ENCOUNTER — Ambulatory Visit
Admission: RE | Admit: 2018-08-08 | Discharge: 2018-08-08 | Disposition: A | Discharge status: Home / Self Care | Payer: Medicare Other | Source: Ambulatory Visit | Attending: Diagnostic Neuroimaging | Admitting: Diagnostic Neuroimaging

## 2018-08-08 DIAGNOSIS — R413 Other amnesia: Secondary | ICD-10-CM | POA: Diagnosis not present

## 2018-08-08 DIAGNOSIS — S0990XA Unspecified injury of head, initial encounter: Secondary | ICD-10-CM | POA: Diagnosis not present

## 2018-08-12 ENCOUNTER — Telehealth: Payer: Self-pay | Admitting: Diagnostic Neuroimaging

## 2018-08-12 DIAGNOSIS — R2689 Other abnormalities of gait and mobility: Secondary | ICD-10-CM | POA: Diagnosis not present

## 2018-08-12 DIAGNOSIS — M6281 Muscle weakness (generalized): Secondary | ICD-10-CM | POA: Diagnosis not present

## 2018-08-12 DIAGNOSIS — M25511 Pain in right shoulder: Secondary | ICD-10-CM | POA: Diagnosis not present

## 2018-08-12 NOTE — Telephone Encounter (Signed)
No acute / major findings. -VRP

## 2018-08-12 NOTE — Telephone Encounter (Signed)
Pt's husband Saralyn Pilar request CT results. She had this on Friday and is aware it should be read in the next few days.

## 2018-08-12 NOTE — Telephone Encounter (Signed)
CT results in  

## 2018-08-12 NOTE — Telephone Encounter (Signed)
I called and LMVM for [name] that pts CT results per Dr. Leta Baptist, show no acute or major findings.  He is to call back if questions.

## 2018-08-13 ENCOUNTER — Encounter

## 2018-08-13 ENCOUNTER — Ambulatory Visit: Payer: Medicare Other | Admitting: Diagnostic Neuroimaging

## 2018-08-14 DIAGNOSIS — M25511 Pain in right shoulder: Secondary | ICD-10-CM | POA: Diagnosis not present

## 2018-08-14 DIAGNOSIS — R2689 Other abnormalities of gait and mobility: Secondary | ICD-10-CM | POA: Diagnosis not present

## 2018-08-14 DIAGNOSIS — M6281 Muscle weakness (generalized): Secondary | ICD-10-CM | POA: Diagnosis not present

## 2018-08-18 ENCOUNTER — Ambulatory Visit: Payer: BLUE CROSS/BLUE SHIELD | Admitting: Diagnostic Neuroimaging

## 2018-08-18 DIAGNOSIS — M25511 Pain in right shoulder: Secondary | ICD-10-CM | POA: Diagnosis not present

## 2018-08-18 DIAGNOSIS — R2689 Other abnormalities of gait and mobility: Secondary | ICD-10-CM | POA: Diagnosis not present

## 2018-08-18 DIAGNOSIS — M6281 Muscle weakness (generalized): Secondary | ICD-10-CM | POA: Diagnosis not present

## 2018-08-19 DIAGNOSIS — M154 Erosive (osteo)arthritis: Secondary | ICD-10-CM | POA: Diagnosis not present

## 2018-08-19 DIAGNOSIS — D649 Anemia, unspecified: Secondary | ICD-10-CM | POA: Diagnosis not present

## 2018-08-20 DIAGNOSIS — L570 Actinic keratosis: Secondary | ICD-10-CM | POA: Diagnosis not present

## 2018-08-21 DIAGNOSIS — M25511 Pain in right shoulder: Secondary | ICD-10-CM | POA: Diagnosis not present

## 2018-08-21 DIAGNOSIS — R2689 Other abnormalities of gait and mobility: Secondary | ICD-10-CM | POA: Diagnosis not present

## 2018-08-21 DIAGNOSIS — M6281 Muscle weakness (generalized): Secondary | ICD-10-CM | POA: Diagnosis not present

## 2018-08-28 DIAGNOSIS — M25511 Pain in right shoulder: Secondary | ICD-10-CM | POA: Diagnosis not present

## 2018-08-28 DIAGNOSIS — M6281 Muscle weakness (generalized): Secondary | ICD-10-CM | POA: Diagnosis not present

## 2018-08-28 DIAGNOSIS — R2689 Other abnormalities of gait and mobility: Secondary | ICD-10-CM | POA: Diagnosis not present

## 2018-09-04 ENCOUNTER — Ambulatory Visit (INDEPENDENT_AMBULATORY_CARE_PROVIDER_SITE_OTHER): Payer: Medicare Other | Admitting: Podiatry

## 2018-09-04 ENCOUNTER — Encounter: Payer: Self-pay | Admitting: Podiatry

## 2018-09-04 DIAGNOSIS — Q828 Other specified congenital malformations of skin: Secondary | ICD-10-CM | POA: Diagnosis not present

## 2018-09-04 DIAGNOSIS — R2689 Other abnormalities of gait and mobility: Secondary | ICD-10-CM | POA: Diagnosis not present

## 2018-09-04 DIAGNOSIS — L603 Nail dystrophy: Secondary | ICD-10-CM | POA: Diagnosis not present

## 2018-09-04 DIAGNOSIS — I779 Disorder of arteries and arterioles, unspecified: Secondary | ICD-10-CM

## 2018-09-04 DIAGNOSIS — M25511 Pain in right shoulder: Secondary | ICD-10-CM | POA: Diagnosis not present

## 2018-09-04 DIAGNOSIS — M6281 Muscle weakness (generalized): Secondary | ICD-10-CM | POA: Diagnosis not present

## 2018-09-04 NOTE — Progress Notes (Signed)
Subjective: 84 year old female presents the office today for concerns of a spot on the right big toe which is been ongoing for the last couple weeks.  She states that she did notice some mild swelling on the area times but denies any drainage or pus or any redness.  She has no other concerns today other than her right big toe is becoming thickened discolored.  No pain, redness or drainage or any swelling. Denies any systemic complaints such as fevers, chills, nausea, vomiting. No acute changes since last appointment, and no other complaints at this time.   Objective: AAO x3, NAD DP/PT pulses palpable bilaterally, CRT less than 3 seconds Hyperkeratotic lesions present on the medial right hallux.  There is dried blood underneath the hallux.  There is no surrounding edema, erythema, drainage or pus.  Mild hypertrophic tissue left foot along submetatarsal 1 hallux as well.  The right hallux, is hypertrophic, dystrophic with brown discoloration.  No pain, redness or drainage or any signs of infection. No open lesions or pre-ulcerative lesions.  No pain with calf compression, swelling, warmth, erythema  Assessment: Hyperkeratotic lesions bilaterally with onychodystrophy  Plan: -All treatment options discussed with the patient including all alternatives, risks, complications.  -I sharply debrided the hyperkeratotic lesion to the any complications or bleeding.  Discussed moisturizer to the area daily.  Dispensed offloading pads.  I debrided the right hallux toenail with any complications or bleeding as well. -Patient encouraged to call the office with any questions, concerns, change in symptoms.   Trula Slade DPM

## 2018-09-11 ENCOUNTER — Ambulatory Visit: Payer: Medicare Other | Admitting: Podiatry

## 2018-09-12 DIAGNOSIS — Z23 Encounter for immunization: Secondary | ICD-10-CM | POA: Diagnosis not present

## 2018-09-18 ENCOUNTER — Non-Acute Institutional Stay: Payer: Medicare Other | Admitting: Nurse Practitioner

## 2018-09-18 ENCOUNTER — Encounter: Payer: Self-pay | Admitting: Nurse Practitioner

## 2018-09-18 DIAGNOSIS — N390 Urinary tract infection, site not specified: Secondary | ICD-10-CM

## 2018-09-18 DIAGNOSIS — M549 Dorsalgia, unspecified: Secondary | ICD-10-CM | POA: Diagnosis not present

## 2018-09-18 DIAGNOSIS — G8929 Other chronic pain: Secondary | ICD-10-CM

## 2018-09-18 DIAGNOSIS — I779 Disorder of arteries and arterioles, unspecified: Secondary | ICD-10-CM

## 2018-09-18 DIAGNOSIS — R350 Frequency of micturition: Secondary | ICD-10-CM

## 2018-09-18 DIAGNOSIS — N200 Calculus of kidney: Secondary | ICD-10-CM

## 2018-09-18 DIAGNOSIS — L84 Corns and callosities: Secondary | ICD-10-CM | POA: Diagnosis not present

## 2018-09-18 DIAGNOSIS — L570 Actinic keratosis: Secondary | ICD-10-CM | POA: Diagnosis not present

## 2018-09-18 HISTORY — DX: Frequency of micturition: R35.0

## 2018-09-18 NOTE — Assessment & Plan Note (Addendum)
Plantar aspect of the right MTP joint , s/p Podiatrist, sore, no s/s of infection, recommend better fit shoes.

## 2018-09-18 NOTE — Patient Instructions (Signed)
F/u with Urology, if not, then obtain CBC/diff, UA C/S next Tuesday

## 2018-09-18 NOTE — Progress Notes (Signed)
Location:    clinic [facility]   Place of Service:  Clinic (12) Provider: Marlana Latus NP  Code Status: DNR Goals of Care: [place] 07/08/2018  Does Patient Have a Medical Advance Directive? Yes  Type of Advance Directive Out of facility DNR (pink MOST or yellow form)  Does patient want to make changes to medical advance directive? No - Patient declined  Copy of [place] in Chart? -  Would patient like information on creating a medical advance directive? -  Pre-existing out of facility DNR order (yellow form or pink MOST form) Yellow form placed in chart (order not valid for inpatient use)     Chief Complaint  Patient presents with  . Acute Visit    lower back pain x 2 weeks,     HPI: Patient is a 84 y.o. female seen today for an acute visit for the right sided lower back pain for 2 weeks,  on and off, no identified aggravating or relieving factors, localized, severity is intense, natures is aches/dull pain. She admitted increased urinary frequency/urgency and leakage, but denied dysuria, or hematuria. Hx of kidney stones, has Urology evaluation in the past. She is taking TMP 100mg  qd for UTI suppression. Tramadol 50mg  tid prn and 200mg  qd, Tylenol PM qhs. She is afebrile, no pain today. C/o sore the right plantar of the first MTP joint, pared corn by podiatrist, no s/s of infection.                                                                                                                                                                                             Past Medical History:  Diagnosis Date  . Anemia   . Anxiety   . Aortic stenosis    moderate by echo 2019  . Arthritis   . Broken arm    right   . Chronic diastolic CHF (congestive heart failure) ([place])   . Family history of adverse reaction to anesthesia    pt. states sister vomits  . Fibromyalgia   . Fracture of thumb 09/01/2015   right  . Fracture, foot 09/01/2015   right  . GERD  (gastroesophageal reflux disease)   . H/O hiatal hernia   . History of bronchitis   . History of kidney stones   . Hypercholesterolemia   . Hypertension    dr t turner  . Hypothyroidism   . Neuropathy   . Osteoporosis   . Parkinson disease ([place])   . PVD (peripheral vascular disease) ([facility])    99% stenosis of left anteiror tibial artery, mod stenosis of left distal  SFA and popliteal artery followed by [name]  . RBBB    noted on EKG 2018  . Restless leg   . Shortness of breath   . Sjogren's disease ([place])   . SOB (shortness of breath)    chronic due to diastolic dysfunction, deconditioning, obesity  . Tremor     Past Surgical History:  Procedure Laterality Date  . ABDOMINAL HYSTERECTOMY    . BACK SURGERY     4 back surgeries,   lumbar fusion  . CARDIAC CATHETERIZATION  2006   normal  . EYE SURGERY Bilateral    cateracts  . falls     variious fall, broken wrist,and toes  . FRACTURE SURGERY Right April 2016   Wrist, Pt. fell  . RIGHT/LEFT HEART CATH AND CORONARY ANGIOGRAPHY N/A 02/05/2018   Procedure: RIGHT/LEFT HEART CATH AND CORONARY ANGIOGRAPHY;  Surgeon: Troy Sine, MD;  Location: [facility];  Service: Cardiovascular;  Laterality: N/A;  . SPINAL CORD STIMULATOR INSERTION N/A 09/09/2015   Procedure: LUMBAR SPINAL CORD STIMULATOR INSERTION;  Surgeon: Clydell Hakim, MD;  Location: [facility];  Service: Neurosurgery;  Laterality: N/A;  LUMBAR SPINAL CORD STIMULATOR INSERTION  . SPINE SURGERY  April 2013   Back X's 4  . TOTAL HIP ARTHROPLASTY     right  . WRIST FRACTURE SURGERY Bilateral     Allergies  Allergen Reactions  . Sulfa Antibiotics Other (See Comments)    Headache, very sick  . Codeine Other (See Comments)    Reaction:  Headaches and nightmares   . Lactose Intolerance (Gi) Nausea And Vomiting  . Latex Rash  . Lyrica [Pregabalin] Swelling and Other (See Comments)    Reaction:  Leg swelling  . Other Other (See Comments)    Pt states that pain  medications give her nightmares.    . Plaquenil [Hydroxychloroquine Sulfate] Other (See Comments)    Reaction:  GI upset   . Reglan [Metoclopramide] Other (See Comments)    Reaction:  GI upset   . Requip [Ropinirole Hcl] Other (See Comments)    Reaction:  GI upset   . Septra [Sulfamethoxazole-Trimethoprim] Nausea And Vomiting  . Shellfish Allergy Nausea And Vomiting    Allergies as of 09/18/2018      Reactions   Sulfa Antibiotics Other (See Comments)   Headache, very sick   Codeine Other (See Comments)   Reaction:  Headaches and nightmares    Lactose Intolerance (gi) Nausea And Vomiting   Latex Rash   Lyrica [pregabalin] Swelling, Other (See Comments)   Reaction:  Leg swelling   Other Other (See Comments)   Pt states that pain medications give her nightmares.     Plaquenil [hydroxychloroquine Sulfate] Other (See Comments)   Reaction:  GI upset    Reglan [metoclopramide] Other (See Comments)   Reaction:  GI upset    Requip [ropinirole Hcl] Other (See Comments)   Reaction:  GI upset    Septra [sulfamethoxazole-trimethoprim] Nausea And Vomiting   Shellfish Allergy Nausea And Vomiting      Medication List        Accurate as of 09/18/18 11:59 PM. Always use your most recent med list.          ALPRAZolam 0.5 MG tablet Commonly known as:  XANAX 1 tab 1 hour prior to MRI , may repeat once if necessary   aspirin EC 81 MG tablet Take 81 mg by mouth at bedtime.   CALCIUM 1000 + D 1000-800 MG-UNIT Tabs Generic drug:  Calcium Carb-Cholecalciferol Take 1 tablet by mouth daily.   carbidopa-levodopa 25-100 MG tablet Commonly known as:  SINEMET IR Take 2 tablets by mouth 3 (three) times daily.   denosumab 60 MG/ML Soln injection Commonly known as:  PROLIA Inject 60 mg into the skin every 6 (six) months. Administer in upper arm, thigh, or abdomen   DEXILANT 60 MG capsule Generic drug:  dexlansoprazole Take 60 mg by mouth daily.   diphenhydramine-acetaminophen 25-500 MG  Tabs tablet Commonly known as:  TYLENOL PM Take 1 tablet by mouth at bedtime.   docusate sodium 100 MG capsule Commonly known as:  COLACE Take 100 mg by mouth at bedtime.   eucerin cream Apply 1 application topically daily.   ezetimibe 10 MG tablet Commonly known as:  ZETIA Take 10 mg by mouth at bedtime.   hydroxychloroquine 200 MG tablet Commonly known as:  PLAQUENIL Take 200 mg by mouth daily.   levothyroxine 50 MCG tablet Commonly known as:  SYNTHROID, LEVOTHROID Take 50 mcg by mouth daily before breakfast.   lovastatin 40 MG tablet Commonly known as:  MEVACOR Take 40 mg by mouth at bedtime.   metoprolol tartrate 25 MG tablet Commonly known as:  LOPRESSOR TAKE 1 TABLET ONCE DAILY.   multivitamin with minerals Tabs tablet Take 1 tablet by mouth daily.   pramipexole 1 MG tablet Commonly known as:  MIRAPEX Take 1 tablet (1 mg total) by mouth 3 (three) times daily.   SYSTANE 0.4-0.3 % Soln Generic drug:  Polyethyl Glycol-Propyl Glycol Place 1 drop into both eyes daily.   SYSTANE 0.4-0.3 % Gel ophthalmic gel Generic drug:  Polyethyl Glycol-Propyl Glycol Place 1 application into both eyes at bedtime.   traMADol 200 MG 24 hr tablet Commonly known as:  ULTRAM-ER Take 1 tablet (200 mg total) by mouth daily.   traMADol 50 MG tablet Commonly known as:  ULTRAM Take 1 tablet (50 mg total) by mouth 3 (three) times daily as needed.   trimethoprim 100 MG tablet Commonly known as:  TRIMPEX Take 100 mg by mouth daily.   Vitamin D 2000 units Caps Take 2,000 Units by mouth daily.      The patient's husband present during today's visit.  Review of Systems:  Review of Systems  Constitutional: Negative for activity change, appetite change, chills, diaphoresis, fatigue and fever.  HENT: Positive for hearing loss. Negative for congestion and voice change.   Respiratory: Negative for cough, shortness of breath and wheezing.   Cardiovascular: Positive for leg swelling.  Negative for chest pain and palpitations.  Gastrointestinal: Negative for abdominal distention, abdominal pain, constipation, diarrhea, nausea and vomiting.  Genitourinary: Positive for frequency and urgency. Negative for difficulty urinating, dysuria, flank pain and hematuria.       Urinary leakage.   Musculoskeletal: Positive for arthralgias, back pain and gait problem. Negative for joint swelling.  Skin: Negative for color change and pallor.       Right foot s/p pared corn.   Neurological: Negative for dizziness, tremors, facial asymmetry, speech difficulty and weakness.       Memory lapses.   Psychiatric/Behavioral: Positive for confusion. Negative for agitation, behavioral problems, hallucinations and sleep disturbance. The patient is not nervous/anxious.     Health Maintenance  Topic Date Due  . PNA vac Low Risk Adult (1 of 2 - PCV13) 10/01/1998  . TETANUS/TDAP  08/31/2025  . INFLUENZA VACCINE  Completed  . DEXA SCAN  Completed    Physical Exam: Vitals:   09/18/18 1332  BP: 110/60  Pulse: 75  Resp: 20  Temp: 98 F (36.7 C)  TempSrc: Oral  SpO2: 93%  Weight: 160 lb (72.6 kg)  Height: 5\' 2"  (1.575 m)   Body mass index is 29.26 kg/m. Physical Exam  Constitutional: She appears well-developed and well-nourished.  HENT:  Head: Normocephalic and atraumatic.  Eyes: Pupils are equal, round, and reactive to light. EOM are normal.  Neck: Normal range of motion. Neck supple. No JVD present. No thyromegaly present.  Cardiovascular: Normal rate and regular rhythm.  Murmur heard. Pulmonary/Chest: Effort normal. She has no wheezes. She has no rales.  Abdominal: Soft. Bowel sounds are normal. She exhibits no distension. There is no tenderness. There is no rebound and no guarding.  Musculoskeletal: She exhibits edema. She exhibits no tenderness.  Trace edema. No pain palpated mid to lower back or CVA areas.   Neurological: She is alert. No cranial nerve deficit. She exhibits normal  muscle tone. Coordination abnormal.  Oriented to person and place.   Skin: Skin is warm and dry.  S/p pared corn @ plantar aspect of the 1st MTP joint  Psychiatric: She has a normal mood and affect. Her behavior is normal.    Labs reviewed: Basic Metabolic Panel: Recent Labs    01/24/18 1059 02/03/18 1220 04/15/18 0725 07/01/18 0715  NA 139 143  --  141  K 4.6 4.5  --  4.4  CL 105 106  --  108  CO2 20 22  --  25  GLUCOSE 96 95  --  96  BUN 20 20  --  15  CREATININE 0.89 0.77  --  0.99*  CALCIUM 9.7 9.9  --  9.6  TSH  --   --  1.28 1.77   Liver Function Tests: Recent Labs    01/31/18 0821 07/01/18 0715  AST 32 18  ALT 7 9  ALKPHOS 56  --   BILITOT 0.4 0.4  PROT 6.1 6.5  ALBUMIN 4.2  --    No results for input(s): LIPASE, AMYLASE in the last 8760 hours. No results for input(s): AMMONIA in the last 8760 hours. CBC: Recent Labs    02/03/18 1220 07/01/18 0715  WBC 7.6 5.6  HGB 10.6* 9.6*  HCT 31.9* 29.5*  MCV 89 87.3  PLT 330 334   Lipid Panel: Recent Labs    01/31/18 0821 07/01/18 0715  CHOL 155 164  HDL 61 60  LDLCALC 55 77  TRIG 196* 161*  CHOLHDL 2.5 2.7   No results found for: HGBA1C  Procedures since last visit: No results found.  Assessment/Plan Urinary frequency New onset right lower back pain(nature is on and off, sudden onset and resolving), urinary frequency, urgency, incontinence for a few days, she denied dysuria, is afebrile. Hx of kidney stones, lower back pain with implanted  spinal stimulator, s/p spinal surgery. Recommended to f/u Urology, otherwise may get CBC/diff and UA C/S in clinic [facility]. Continue TMP for infection suppression.   Corns and callus Plantar aspect of the right MTP joint , s/p Podiatrist, sore, no s/s of infection, recommend better fit shoes.   Recurrent UTI Continue TMP 100mg  qd, f/u Urology.   Chronic back pain Right lower back, new, nature is on and off, not positional, not related to body movement. Continue  Tylenol, Tramadol, spinal stimulator.   Renal lithiasis Incidental findings on CT, f/u Urology.     Labs/tests ordered:  None  Next appt:  10/09/2018  Time spend 25 minutes.

## 2018-09-18 NOTE — Assessment & Plan Note (Signed)
New onset right lower back pain(nature is on and off, sudden onset and resolving), urinary frequency, urgency, incontinence for a few days, she denied dysuria, is afebrile. Hx of kidney stones, lower back pain with implanted  spinal stimulator, s/p spinal surgery. Recommended to f/u Urology, otherwise may get CBC/diff and UA C/S in clinic [facility]. Continue TMP for infection suppression.

## 2018-09-19 ENCOUNTER — Telehealth: Payer: Self-pay | Admitting: *Deleted

## 2018-09-19 DIAGNOSIS — R399 Unspecified symptoms and signs involving the genitourinary system: Secondary | ICD-10-CM | POA: Diagnosis not present

## 2018-09-19 DIAGNOSIS — N200 Calculus of kidney: Secondary | ICD-10-CM | POA: Insufficient documentation

## 2018-09-19 NOTE — Assessment & Plan Note (Signed)
Continue TMP 100mg  qd, f/u Urology.

## 2018-09-19 NOTE — Telephone Encounter (Signed)
Patient's spouse Saralyn Pilar) called to inform [facility] that they did get an appointment with Dr. Amalia Hailey the urologist for next week and will attend that appointment.

## 2018-09-19 NOTE — Assessment & Plan Note (Signed)
Incidental findings on CT, f/u Urology.

## 2018-09-19 NOTE — Assessment & Plan Note (Signed)
Right lower back, new, nature is on and off, not positional, not related to body movement. Continue Tylenol, Tramadol, spinal stimulator.

## 2018-09-22 ENCOUNTER — Encounter: Payer: Self-pay | Admitting: Nurse Practitioner

## 2018-09-23 ENCOUNTER — Other Ambulatory Visit: Payer: Self-pay

## 2018-09-23 DIAGNOSIS — N200 Calculus of kidney: Secondary | ICD-10-CM | POA: Diagnosis not present

## 2018-09-23 DIAGNOSIS — F331 Major depressive disorder, recurrent, moderate: Secondary | ICD-10-CM | POA: Diagnosis not present

## 2018-09-29 ENCOUNTER — Telehealth: Payer: Self-pay | Admitting: Internal Medicine

## 2018-09-29 DIAGNOSIS — N200 Calculus of kidney: Secondary | ICD-10-CM | POA: Diagnosis not present

## 2018-09-29 NOTE — Telephone Encounter (Signed)
I left a message asking the patient to call me at [phone] to schedule AWV with Clarise Cruz at [facility] on morning of 10/27/18 if available. VDM (DD)

## 2018-10-09 ENCOUNTER — Non-Acute Institutional Stay: Payer: Medicare Other | Admitting: Nurse Practitioner

## 2018-10-09 ENCOUNTER — Encounter: Payer: Self-pay | Admitting: Nurse Practitioner

## 2018-10-09 DIAGNOSIS — G8929 Other chronic pain: Secondary | ICD-10-CM | POA: Diagnosis not present

## 2018-10-09 DIAGNOSIS — K219 Gastro-esophageal reflux disease without esophagitis: Secondary | ICD-10-CM | POA: Diagnosis not present

## 2018-10-09 DIAGNOSIS — M3500 Sicca syndrome, unspecified: Secondary | ICD-10-CM | POA: Diagnosis not present

## 2018-10-09 DIAGNOSIS — M810 Age-related osteoporosis without current pathological fracture: Secondary | ICD-10-CM

## 2018-10-09 DIAGNOSIS — D509 Iron deficiency anemia, unspecified: Secondary | ICD-10-CM | POA: Diagnosis not present

## 2018-10-09 DIAGNOSIS — N200 Calculus of kidney: Secondary | ICD-10-CM

## 2018-10-09 DIAGNOSIS — I10 Essential (primary) hypertension: Secondary | ICD-10-CM | POA: Diagnosis not present

## 2018-10-09 DIAGNOSIS — N390 Urinary tract infection, site not specified: Secondary | ICD-10-CM | POA: Diagnosis not present

## 2018-10-09 DIAGNOSIS — M549 Dorsalgia, unspecified: Secondary | ICD-10-CM | POA: Diagnosis not present

## 2018-10-09 DIAGNOSIS — G20 Parkinson's disease: Secondary | ICD-10-CM

## 2018-10-09 NOTE — Assessment & Plan Note (Addendum)
09/29/18 CT stone study: multiple renal calculi without obstructive changes. Consider Lithotripsy. Tea color urine

## 2018-10-09 NOTE — Assessment & Plan Note (Signed)
Hx of HTN, blood pressure is in control, continue Metoprolol 25mg  qd.

## 2018-10-09 NOTE — Assessment & Plan Note (Signed)
Hgb 9.6 06/2018, Fe bid, f/u Rheumatology.

## 2018-10-09 NOTE — Progress Notes (Addendum)
Location:   clinic [facility]   Place of Service:  Clinic (12)clinic [facility]  Provider: Marlana Latus NP  Code Status: DNR Goals of Care: [place] 07/08/2018  Does Patient Have a Medical Advance Directive? Yes  Type of Advance Directive Out of facility DNR (pink MOST or yellow form)  Does patient want to make changes to medical advance directive? No - Patient declined  Copy of [place] in Chart? -  Would patient like information on creating a medical advance directive? -  Pre-existing out of facility DNR order (yellow form or pink MOST form) Yellow form placed in chart (order not valid for inpatient use)     Chief Complaint  Patient presents with  . Medical Management of Chronic Issues    3 mo f/u    HPI: Patient is a 84 y.o. female seen today for medical management of chronic diseases.     The patient has history for lower back pain, implanted neuro stimulator, Tramadol 200mg  qd, 50mg  tid prn, Tylenol  qhs, Alprazolam 0.5mg  prn. Hx of HTN, blood pressure is in control, on Metoprolol 25mg  qd. UTI, suppression tx on TMP 100mg  qd. Parkinson's, manage on Mirapex 1mg  tid, Sinemet 25/100 II tid. Osteoporosis, on Prolia, Ca, Vit D. GERD, stable on Dexlansoprazole 60mg  daily. Sjogren's syndrome, stable on Plaquenil 200mg  qd.   Past Medical History:  Diagnosis Date  . Anemia   . Anxiety   . Aortic stenosis    moderate by echo 2019  . Arthritis   . Broken arm    right   . Chronic diastolic CHF (congestive heart failure) ([place])   . Family history of adverse reaction to anesthesia    pt. states sister vomits  . Fibromyalgia   . Fracture of thumb 09/01/2015   right  . Fracture, foot 09/01/2015   right  . GERD (gastroesophageal reflux disease)   . H/O hiatal hernia   . History of bronchitis   . History of kidney stones   . Hypercholesterolemia   . Hypertension    dr t turner  . Hypothyroidism   . Neuropathy   . Osteoporosis   . Parkinson disease ([place])     . PVD (peripheral vascular disease) ([facility])    99% stenosis of left anteiror tibial artery, mod stenosis of left distal SFA and popliteal artery followed by [name]  . RBBB    noted on EKG 2018  . Restless leg   . Shortness of breath   . Sjogren's disease ([place])   . SOB (shortness of breath)    chronic due to diastolic dysfunction, deconditioning, obesity  . Tremor     Past Surgical History:  Procedure Laterality Date  . ABDOMINAL HYSTERECTOMY    . BACK SURGERY     4 back surgeries,   lumbar fusion  . CARDIAC CATHETERIZATION  2006   normal  . EYE SURGERY Bilateral    cateracts  . falls     variious fall, broken wrist,and toes  . FRACTURE SURGERY Right April 2016   Wrist, Pt. fell  . RIGHT/LEFT HEART CATH AND CORONARY ANGIOGRAPHY N/A 02/05/2018   Procedure: RIGHT/LEFT HEART CATH AND CORONARY ANGIOGRAPHY;  Surgeon: Troy Sine, MD;  Location: [facility];  Service: Cardiovascular;  Laterality: N/A;  . SPINAL CORD STIMULATOR INSERTION N/A 09/09/2015   Procedure: LUMBAR SPINAL CORD STIMULATOR INSERTION;  Surgeon: Clydell Hakim, MD;  Location: [facility];  Service: Neurosurgery;  Laterality: N/A;  LUMBAR SPINAL CORD  STIMULATOR INSERTION  . SPINE SURGERY  April 2013   Back X's 4  . TOTAL HIP ARTHROPLASTY     right  . WRIST FRACTURE SURGERY Bilateral     Allergies  Allergen Reactions  . Lactose Other (See Comments)    abd pain, lactose intolerant  . Sulfa Antibiotics Other (See Comments)    Headache, very sick  . Codeine Other (See Comments)    Reaction:  Headaches and nightmares   . Lactose Intolerance (Gi) Nausea And Vomiting  . Latex Rash  . Lyrica [Pregabalin] Swelling and Other (See Comments)    Reaction:  Leg swelling  . Other Other (See Comments)    Pt states that pain medications give her nightmares.    . Plaquenil [Hydroxychloroquine Sulfate] Other (See Comments)    Reaction:  GI upset   . Reglan [Metoclopramide] Other (See Comments)    Reaction:  GI  upset   . Requip [Ropinirole Hcl] Other (See Comments)    Reaction:  GI upset   . Septra [Sulfamethoxazole-Trimethoprim] Nausea And Vomiting  . Shellfish Allergy Nausea And Vomiting    Allergies as of 10/09/2018      Reactions   Lactose Other (See Comments)   abd pain, lactose intolerant   Sulfa Antibiotics Other (See Comments)   Headache, very sick   Codeine Other (See Comments)   Reaction:  Headaches and nightmares    Lactose Intolerance (gi) Nausea And Vomiting   Latex Rash   Lyrica [pregabalin] Swelling, Other (See Comments)   Reaction:  Leg swelling   Other Other (See Comments)   Pt states that pain medications give her nightmares.     Plaquenil [hydroxychloroquine Sulfate] Other (See Comments)   Reaction:  GI upset    Reglan [metoclopramide] Other (See Comments)   Reaction:  GI upset    Requip [ropinirole Hcl] Other (See Comments)   Reaction:  GI upset    Septra [sulfamethoxazole-trimethoprim] Nausea And Vomiting   Shellfish Allergy Nausea And Vomiting      Medication List        Accurate as of 10/09/18 11:59 PM. Always use your most recent med list.          acetaminophen 325 MG tablet Commonly known as:  TYLENOL Take 325 mg by mouth daily.   ALPRAZolam 0.5 MG tablet Commonly known as:  XANAX 1 tab 1 hour prior to MRI , may repeat once if necessary   aspirin EC 81 MG tablet Take 81 mg by mouth at bedtime.   CALCIUM 1000 + D 1000-800 MG-UNIT Tabs Generic drug:  Calcium Carb-Cholecalciferol Take 1 tablet by mouth daily.   carbidopa-levodopa 25-100 MG tablet Commonly known as:  SINEMET IR Take 2 tablets by mouth 3 (three) times daily.   denosumab 60 MG/ML Soln injection Commonly known as:  PROLIA Inject 60 mg into the skin every 6 (six) months. Administer in upper arm, thigh, or abdomen   DEXILANT 60 MG capsule Generic drug:  dexlansoprazole Take 60 mg by mouth daily.   docusate sodium 100 MG capsule Commonly known as:  COLACE Take 100 mg by  mouth at bedtime.   eucerin cream Apply 1 application topically daily.   ezetimibe 10 MG tablet Commonly known as:  ZETIA Take 10 mg by mouth at bedtime.   levothyroxine 50 MCG tablet Commonly known as:  SYNTHROID, LEVOTHROID Take 50 mcg by mouth daily before breakfast.   lovastatin 40 MG tablet Commonly known as:  MEVACOR Take 40 mg by mouth at  bedtime.   metoprolol tartrate 25 MG tablet Commonly known as:  LOPRESSOR TAKE 1 TABLET ONCE DAILY.   multivitamin with minerals Tabs tablet Take 1 tablet by mouth daily.   pramipexole 1 MG tablet Commonly known as:  MIRAPEX Take 1 tablet (1 mg total) by mouth 3 (three) times daily.   SYSTANE 0.4-0.3 % Soln Generic drug:  Polyethyl Glycol-Propyl Glycol Place 1 drop into both eyes daily.   SYSTANE 0.4-0.3 % Gel ophthalmic gel Generic drug:  Polyethyl Glycol-Propyl Glycol Place 1 application into both eyes at bedtime.   traMADol 200 MG 24 hr tablet Commonly known as:  ULTRAM-ER Take 1 tablet (200 mg total) by mouth daily.   traMADol 50 MG tablet Commonly known as:  ULTRAM Take 1 tablet (50 mg total) by mouth 3 (three) times daily as needed.   trimethoprim 100 MG tablet Commonly known as:  TRIMPEX Take 100 mg by mouth daily.   Vitamin D 2000 units Caps Take 2,000 Units by mouth daily.       Review of Systems:  Review of Systems  Constitutional: Negative for activity change, appetite change, chills, diaphoresis, fatigue and fever.  HENT: Positive for hearing loss. Negative for congestion and voice change.   Respiratory: Negative for cough, shortness of breath and wheezing.   Cardiovascular: Positive for leg swelling. Negative for chest pain and palpitations.  Gastrointestinal: Negative for abdominal distention, abdominal pain, constipation, diarrhea, nausea and vomiting.  Genitourinary: Positive for frequency. Negative for dysuria and urgency.       Urinary leakage.   Musculoskeletal: Positive for arthralgias, back  pain and gait problem.  Skin: Negative for color change and pallor.  Neurological: Negative for dizziness, facial asymmetry, speech difficulty and weakness.       Memory lapses.   Psychiatric/Behavioral: Negative for agitation, behavioral problems, hallucinations and sleep disturbance. The patient is not nervous/anxious.     Health Maintenance  Topic Date Due  . PNA vac Low Risk Adult (1 of 2 - PCV13) 10/01/1998  . TETANUS/TDAP  08/31/2025  . INFLUENZA VACCINE  Completed  . DEXA SCAN  Completed    Physical Exam: Vitals:   10/09/18 1311  BP: 112/72  Pulse: 74  Resp: 20  Temp: 97.9 F (36.6 C)  TempSrc: Oral  SpO2: 97%  Weight: 159 lb 6.4 oz (72.3 kg)  Height: 5\' 2"  (1.575 m)   Body mass index is 29.15 kg/m. Physical Exam  Constitutional: She appears well-developed and well-nourished.  HENT:  Head: Normocephalic and atraumatic.  Eyes: Pupils are equal, round, and reactive to light. EOM are normal.  Neck: Normal range of motion. Neck supple. No JVD present. No thyromegaly present.  Cardiovascular: Normal rate and regular rhythm.  Murmur heard. Pulmonary/Chest: Effort normal. She has no wheezes. She has no rales.  Abdominal: Soft. She exhibits no distension. There is no tenderness. There is no rebound and no guarding.  Musculoskeletal: She exhibits edema.  Trace edema BLE. Ambulates with walker.   Neurological: She is alert. No cranial nerve deficit. She exhibits normal muscle tone. Coordination normal.  Oriented to person and place.   Skin: Skin is warm and dry.  Psychiatric: She has a normal mood and affect. Her behavior is normal.    Labs reviewed: Basic Metabolic Panel: Recent Labs    01/24/18 1059 02/03/18 1220 04/15/18 0725 07/01/18 0715  NA 139 143  --  141  K 4.6 4.5  --  4.4  CL 105 106  --  108  CO2 20  22  --  25  GLUCOSE 96 95  --  96  BUN 20 20  --  15  CREATININE 0.89 0.77  --  0.99*  CALCIUM 9.7 9.9  --  9.6  TSH  --   --  1.28 1.77   Liver  Function Tests: Recent Labs    01/31/18 0821 07/01/18 0715  AST 32 18  ALT 7 9  ALKPHOS 56  --   BILITOT 0.4 0.4  PROT 6.1 6.5  ALBUMIN 4.2  --    No results for input(s): LIPASE, AMYLASE in the last 8760 hours. No results for input(s): AMMONIA in the last 8760 hours. CBC: Recent Labs    02/03/18 1220 07/01/18 0715  WBC 7.6 5.6  HGB 10.6* 9.6*  HCT 31.9* 29.5*  MCV 89 87.3  PLT 330 334   Lipid Panel: Recent Labs    01/31/18 0821 07/01/18 0715  CHOL 155 164  HDL 61 60  LDLCALC 55 77  TRIG 196* 161*  CHOLHDL 2.5 2.7   No results found for: HGBA1C  Procedures since last visit: No results found.  Assessment/Plan  Chronic back pain lower back pain, continue implanted neuro stimulator-f/u [employer], Tramadol 200mg  qd, 50mg  tid prn, Tylenol qhs, Alprazolam 0.5mg  prn.   Hypertension Hx of HTN, blood pressure is in control, continue Metoprolol 25mg  qd.   Recurrent UTI UTI, suppression tx on TMP 100mg  qd.   Parkinson's disease Parkinson's, managed, not disabling, continue Mirapex 1mg  tid, Sinemet 25/100 II tid.   Osteoporosis without current pathological fracture Osteoporosis, continue Prolia, Ca, Vit D.   GERD (gastroesophageal reflux disease) GERD, stable, continue Dexlansoprazole 60mg  daily.   Sjogren's syndrome ([place]) Stable, continue Plaquenil 200mg  qd.    Renal lithiasis 09/29/18 CT stone study: multiple renal calculi without obstructive changes. Consider Lithotripsy. Tea color urine   Iron deficiency anemia Hgb 9.6 06/2018, Fe bid, f/u Rheumatology.    Labs/tests ordered: none  Next appt:  10/27/2018  Time spend 25 minutes.

## 2018-10-09 NOTE — Assessment & Plan Note (Signed)
Parkinson's, managed, not disabling, continue Mirapex 1mg  tid, Sinemet 25/100 II tid.

## 2018-10-09 NOTE — Assessment & Plan Note (Addendum)
lower back pain, continue implanted neuro stimulator-f/u [employer], Tramadol 200mg  qd, 50mg  tid prn, Tylenol qhs, Alprazolam 0.5mg  prn.

## 2018-10-09 NOTE — Assessment & Plan Note (Signed)
UTI, suppression tx on TMP 100mg  qd.

## 2018-10-09 NOTE — Assessment & Plan Note (Signed)
Osteoporosis, continue Prolia, Ca, Vit D.

## 2018-10-09 NOTE — Assessment & Plan Note (Signed)
GERD, stable, continue Dexlansoprazole 60mg  daily.

## 2018-10-09 NOTE — Assessment & Plan Note (Signed)
Stable, continue Plaquenil 200mg  qd.

## 2018-10-10 ENCOUNTER — Encounter: Payer: Self-pay | Admitting: Nurse Practitioner

## 2018-10-10 NOTE — Addendum Note (Signed)
Addended by: Dorothea Ogle X on: 10/10/2018 03:32 PM   Modules accepted: Level of Service

## 2018-10-13 DIAGNOSIS — M25511 Pain in right shoulder: Secondary | ICD-10-CM | POA: Diagnosis not present

## 2018-10-13 DIAGNOSIS — R2689 Other abnormalities of gait and mobility: Secondary | ICD-10-CM | POA: Diagnosis not present

## 2018-10-13 DIAGNOSIS — M6281 Muscle weakness (generalized): Secondary | ICD-10-CM | POA: Diagnosis not present

## 2018-10-15 DIAGNOSIS — M154 Erosive (osteo)arthritis: Secondary | ICD-10-CM | POA: Diagnosis not present

## 2018-10-15 DIAGNOSIS — Z79899 Other long term (current) drug therapy: Secondary | ICD-10-CM | POA: Diagnosis not present

## 2018-10-15 DIAGNOSIS — Z6833 Body mass index (BMI) 33.0-33.9, adult: Secondary | ICD-10-CM | POA: Diagnosis not present

## 2018-10-15 DIAGNOSIS — E611 Iron deficiency: Secondary | ICD-10-CM | POA: Diagnosis not present

## 2018-10-15 DIAGNOSIS — M545 Low back pain: Secondary | ICD-10-CM | POA: Diagnosis not present

## 2018-10-15 DIAGNOSIS — E669 Obesity, unspecified: Secondary | ICD-10-CM | POA: Diagnosis not present

## 2018-10-15 DIAGNOSIS — M3500 Sicca syndrome, unspecified: Secondary | ICD-10-CM | POA: Diagnosis not present

## 2018-10-15 DIAGNOSIS — M2550 Pain in unspecified joint: Secondary | ICD-10-CM | POA: Diagnosis not present

## 2018-10-20 DIAGNOSIS — M25511 Pain in right shoulder: Secondary | ICD-10-CM | POA: Diagnosis not present

## 2018-10-20 DIAGNOSIS — R2689 Other abnormalities of gait and mobility: Secondary | ICD-10-CM | POA: Diagnosis not present

## 2018-10-20 DIAGNOSIS — M6281 Muscle weakness (generalized): Secondary | ICD-10-CM | POA: Diagnosis not present

## 2018-10-23 DIAGNOSIS — R2689 Other abnormalities of gait and mobility: Secondary | ICD-10-CM | POA: Diagnosis not present

## 2018-10-23 DIAGNOSIS — M25511 Pain in right shoulder: Secondary | ICD-10-CM | POA: Diagnosis not present

## 2018-10-23 DIAGNOSIS — M6281 Muscle weakness (generalized): Secondary | ICD-10-CM | POA: Diagnosis not present

## 2018-10-24 LAB — HM MAMMOGRAPHY

## 2018-10-27 ENCOUNTER — Other Ambulatory Visit: Payer: Self-pay | Admitting: Cardiology

## 2018-10-27 ENCOUNTER — Non-Acute Institutional Stay: Payer: Medicare Other

## 2018-10-27 VITALS — BP 122/58 | HR 71 | Temp 98.0°F | Ht 62.0 in | Wt 159.0 lb

## 2018-10-27 DIAGNOSIS — Z1231 Encounter for screening mammogram for malignant neoplasm of breast: Secondary | ICD-10-CM | POA: Diagnosis not present

## 2018-10-27 DIAGNOSIS — Z0000 Encounter for general adult medical examination without abnormal findings: Secondary | ICD-10-CM

## 2018-10-27 NOTE — Patient Instructions (Signed)
[name] , Thank you for taking time to come for your Medicare Wellness Visit. I appreciate your ongoing commitment to your health goals. Please review the following plan we discussed and let me know if I can assist you in the future.   Screening recommendations/referrals: Colonoscopy excluded, over age 82 Mammogram excluded, over age 41 Bone Density up to date Recommended yearly ophthalmology/optometry visit for glaucoma screening and checkup Recommended yearly dental visit for hygiene and checkup  Vaccinations: Influenza vaccine up to date Pneumococcal vaccine up to date, completed Tdap vaccine up to date, due 08/31/2025 Shingles vaccine up to date, completed    Advanced directives: in chart  Conditions/risks identified: none  Next appointment: [name] 04/09/2019 @ 2pm   Preventive Care 69 Years and Older, Female Preventive care refers to lifestyle choices and visits with your health care provider that can promote health and wellness. What does preventive care include?  A yearly physical exam. This is also called an annual well check.  Dental exams once or twice a year.  Routine eye exams. Ask your health care provider how often you should have your eyes checked.  Personal lifestyle choices, including:  Daily care of your teeth and gums.  Regular physical activity.  Eating a healthy diet.  Avoiding tobacco and drug use.  Limiting alcohol use.  Practicing safe sex.  Taking low-dose aspirin every day.  Taking vitamin and mineral supplements as recommended by your health care provider. What happens during an annual well check? The services and screenings done by your health care provider during your annual well check will depend on your age, overall health, lifestyle risk factors, and family history of disease. Counseling  Your health care provider may ask you questions about your:  Alcohol use.  Tobacco use.  Drug use.  Emotional well-being.  Home and  relationship well-being.  Sexual activity.  Eating habits.  History of falls.  Memory and ability to understand (cognition).  Work and work Statistician.  Reproductive health. Screening  You may have the following tests or measurements:  Height, weight, and BMI.  Blood pressure.  Lipid and cholesterol levels. These may be checked every 5 years, or more frequently if you are over 34 years old.  Skin check.  Lung cancer screening. You may have this screening every year starting at age 3 if you have a 30-pack-year history of smoking and currently smoke or have quit within the past 15 years.  Fecal occult blood test (FOBT) of the stool. You may have this test every year starting at age 34.  Flexible sigmoidoscopy or colonoscopy. You may have a sigmoidoscopy every 5 years or a colonoscopy every 10 years starting at age 58.  Hepatitis C blood test.  Hepatitis B blood test.  Sexually transmitted disease (STD) testing.  Diabetes screening. This is done by checking your blood sugar (glucose) after you have not eaten for a while (fasting). You may have this done every 1-3 years.  Bone density scan. This is done to screen for osteoporosis. You may have this done starting at age 48.  Mammogram. This may be done every 1-2 years. Talk to your health care provider about how often you should have regular mammograms. Talk with your health care provider about your test results, treatment options, and if necessary, the need for more tests. Vaccines  Your health care provider may recommend certain vaccines, such as:  Influenza vaccine. This is recommended every year.  Tetanus, diphtheria, and acellular pertussis (Tdap, Td) vaccine. You may  need a Td booster every 10 years.  Zoster vaccine. You may need this after age 93.  Pneumococcal 13-valent conjugate (PCV13) vaccine. One dose is recommended after age 18.  Pneumococcal polysaccharide (PPSV23) vaccine. One dose is recommended after  age 59. Talk to your health care provider about which screenings and vaccines you need and how often you need them. This information is not intended to replace advice given to you by your health care provider. Make sure you discuss any questions you have with your health care provider. Document Released: 12/23/2015 Document Revised: 08/15/2016 Document Reviewed: 09/27/2015 Elsevier Interactive Patient Education  2017 [place] Prevention in the Home Falls can cause injuries. They can happen to people of all ages. There are many things you can do to make your home safe and to help prevent falls. What can I do on the outside of my home?  Regularly fix the edges of walkways and driveways and fix any cracks.  Remove anything that might make you trip as you walk through a door, such as a raised step or threshold.  Trim any bushes or trees on the path to your home.  Use bright outdoor lighting.  Clear any walking paths of anything that might make someone trip, such as rocks or tools.  Regularly check to see if handrails are loose or broken. Make sure that both sides of any steps have handrails.  Any raised decks and porches should have guardrails on the edges.  Have any leaves, snow, or ice cleared regularly.  Use sand or salt on walking paths during winter.  Clean up any spills in your garage right away. This includes oil or grease spills. What can I do in the bathroom?  Use night lights.  Install grab bars by the toilet and in the tub and shower. Do not use towel bars as grab bars.  Use non-skid mats or decals in the tub or shower.  If you need to sit down in the shower, use a plastic, non-slip stool.  Keep the floor dry. Clean up any water that spills on the floor as soon as it happens.  Remove soap buildup in the tub or shower regularly.  Attach bath mats securely with double-sided non-slip rug tape.  Do not have throw rugs and other things on the floor that can  make you trip. What can I do in the bedroom?  Use night lights.  Make sure that you have a light by your bed that is easy to reach.  Do not use any sheets or blankets that are too big for your bed. They should not hang down onto the floor.  Have a firm chair that has side arms. You can use this for support while you get dressed.  Do not have throw rugs and other things on the floor that can make you trip. What can I do in the kitchen?  Clean up any spills right away.  Avoid walking on wet floors.  Keep items that you use a lot in easy-to-reach places.  If you need to reach something above you, use a strong step stool that has a grab bar.  Keep electrical cords out of the way.  Do not use floor polish or wax that makes floors slippery. If you must use wax, use non-skid floor wax.  Do not have throw rugs and other things on the floor that can make you trip. What can I do with my stairs?  Do not leave any items on  the stairs.  Make sure that there are handrails on both sides of the stairs and use them. Fix handrails that are broken or loose. Make sure that handrails are as long as the stairways.  Check any carpeting to make sure that it is firmly attached to the stairs. Fix any carpet that is loose or worn.  Avoid having throw rugs at the top or bottom of the stairs. If you do have throw rugs, attach them to the floor with carpet tape.  Make sure that you have a light switch at the top of the stairs and the bottom of the stairs. If you do not have them, ask someone to add them for you. What else can I do to help prevent falls?  Wear shoes that:  Do not have high heels.  Have rubber bottoms.  Are comfortable and fit you well.  Are closed at the toe. Do not wear sandals.  If you use a stepladder:  Make sure that it is fully opened. Do not climb a closed stepladder.  Make sure that both sides of the stepladder are locked into place.  Ask someone to hold it for you,  if possible.  Clearly mark and make sure that you can see:  Any grab bars or handrails.  First and last steps.  Where the edge of each step is.  Use tools that help you move around (mobility aids) if they are needed. These include:  Canes.  Walkers.  Scooters.  Crutches.  Turn on the lights when you go into a dark area. Replace any light bulbs as soon as they burn out.  Set up your furniture so you have a clear path. Avoid moving your furniture around.  If any of your floors are uneven, fix them.  If there are any pets around you, be aware of where they are.  Review your medicines with your doctor. Some medicines can make you feel dizzy. This can increase your chance of falling. Ask your doctor what other things that you can do to help prevent falls. This information is not intended to replace advice given to you by your health care provider. Make sure you discuss any questions you have with your health care provider. Document Released: 09/22/2009 Document Revised: 05/03/2016 Document Reviewed: 12/31/2014 Elsevier Interactive Patient Education  2017 [employer].

## 2018-10-27 NOTE — Progress Notes (Signed)
Subjective:   [name] is a 84 y.o. female who presents for Medicare Annual (Subsequent) preventive examination at [facility]  Last AWV-09/12/2017    Objective:     Vitals: BP (!) 122/58 (BP Location: Right Arm, Patient Position: Sitting)   Pulse 71   Temp 98 F (36.7 C) (Oral)   Ht 5\' 2"  (1.575 m)   Wt 159 lb (72.1 kg)   SpO2 97%   BMI 29.08 kg/m   Body mass index is 29.08 kg/m.  Advanced Directives 10/27/2018 07/08/2018 05/08/2018 02/05/2018 07/04/2017 01/13/2017 06/28/2016  Does Patient Have a Medical Advance Directive? Yes Yes Yes Yes Yes No Yes  Type of Advance Directive Out of facility DNR (pink MOST or yellow form) Out of facility DNR (pink MOST or yellow form) [place];Living will [place];Living will [place];Living will - [place];Living will  Does patient want to make changes to medical advance directive? No - Patient declined No - Patient declined - No - Patient declined - - -  Copy of [place] in Chart? - - - No - copy requested No - copy requested - Yes  Would patient like information on creating a medical advance directive? - - - - - No - Patient declined -  Pre-existing out of facility DNR order (yellow form or pink MOST form) Yellow form placed in chart (order not valid for inpatient use) Yellow form placed in chart (order not valid for inpatient use) - - - - -    Tobacco Social History   Tobacco Use  Smoking Status Never Smoker  Smokeless Tobacco Never Used     Counseling given: Not Answered   Clinical Intake:  Pre-visit preparation completed: No  Pain : No/denies pain     Diabetes: No  How often do you need to have someone help you when you read instructions, pamphlets, or other written materials from your doctor or pharmacy?: 1 - Never What is the last grade level you completed in school?: HS  Interpreter  Needed?: No  Information entered by :: Tyson Dense, RN  Past Medical History:  Diagnosis Date  . Anemia   . Anxiety   . Aortic stenosis    moderate by echo 2019  . Arthritis   . Broken arm    right   . Chronic diastolic CHF (congestive heart failure) ([place])   . Family history of adverse reaction to anesthesia    pt. states sister vomits  . Fibromyalgia   . Fracture of thumb 09/01/2015   right  . Fracture, foot 09/01/2015   right  . GERD (gastroesophageal reflux disease)   . H/O hiatal hernia   . History of bronchitis   . History of kidney stones   . Hypercholesterolemia   . Hypertension    dr t turner  . Hypothyroidism   . Neuropathy   . Osteoporosis   . Parkinson disease ([place])   . PVD (peripheral vascular disease) ([facility])    99% stenosis of left anteiror tibial artery, mod stenosis of left distal SFA and popliteal artery followed by [name]  . RBBB    noted on EKG 2018  . Restless leg   . Shortness of breath   . Sjogren's disease ([place])   . SOB (shortness of breath)    chronic due to diastolic dysfunction, deconditioning, obesity  . Tremor    Past Surgical History:  Procedure Laterality Date  .  ABDOMINAL HYSTERECTOMY    . BACK SURGERY     4 back surgeries,   lumbar fusion  . CARDIAC CATHETERIZATION  2006   normal  . EYE SURGERY Bilateral    cateracts  . falls     variious fall, broken wrist,and toes  . FRACTURE SURGERY Right April 2016   Wrist, Pt. fell  . RIGHT/LEFT HEART CATH AND CORONARY ANGIOGRAPHY N/A 02/05/2018   Procedure: RIGHT/LEFT HEART CATH AND CORONARY ANGIOGRAPHY;  Surgeon: Troy Sine, MD;  Location: [facility];  Service: Cardiovascular;  Laterality: N/A;  . SPINAL CORD STIMULATOR INSERTION N/A 09/09/2015   Procedure: LUMBAR SPINAL CORD STIMULATOR INSERTION;  Surgeon: Clydell Hakim, MD;  Location: [facility];  Service: Neurosurgery;  Laterality: N/A;  LUMBAR SPINAL CORD STIMULATOR INSERTION  . SPINE SURGERY  April 2013   Back X's 4   . TOTAL HIP ARTHROPLASTY     right  . WRIST FRACTURE SURGERY Bilateral    Family History  Problem Relation Age of Onset  . Heart attack Mother   . Restless legs syndrome Mother   . Heart failure Mother   . Heart disease Mother   . Hypertension Mother   . COPD Father    Social History   Socioeconomic History  . Marital status: Married    Spouse name: [name]  . Number of children: 2  . Years of education: HS  . Highest education level: Not on file  Occupational History    Comment: Homemaker  Social Needs  . Financial resource strain: Not hard at all  . Food insecurity:    Worry: Never true    Inability: Never true  . Transportation needs:    Medical: No    Non-medical: No  Tobacco Use  . Smoking status: Never Smoker  . Smokeless tobacco: Never Used  Substance and Sexual Activity  . Alcohol use: No    Alcohol/week: 0.0 standard drinks  . Drug use: No  . Sexual activity: Never  Lifestyle  . Physical activity:    Days per week: 2 days    Minutes per session: 30 min  . Stress: Only a little  Relationships  . Social connections:    Talks on phone: More than three times a week    Gets together: More than three times a week    Attends religious service: More than 4 times per year    Active member of club or organization: Yes    Attends meetings of clubs or organizations: More than 4 times per year    Relationship status: Married  Other Topics Concern  . Not on file  Social History Narrative   Patient lives at home with her spouse. Married in 1957. Two people lives in the home, no pets.    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    Caffeine Use: tea    Outpatient Encounter Medications as of 10/27/2018  Medication Sig  . acetaminophen (TYLENOL) 325 MG tablet Take 325 mg by mouth daily.  Marland Kitchen aspirin EC 81 MG tablet Take 81 mg by mouth at bedtime.   . Calcium Carb-Cholecalciferol (CALCIUM 1000 + D) 1000-800  MG-UNIT TABS Take 1 tablet by mouth daily.  . carbidopa-levodopa (SINEMET IR) 25-100 MG tablet Take 2 tablets by mouth 3 (three) times daily.  . Cholecalciferol (VITAMIN D) 2000 units CAPS Take 2,000 Units by mouth daily.  Marland Kitchen denosumab (PROLIA) 60 MG/ML SOLN injection Inject 60  mg into the skin every 6 (six) months. Administer in upper arm, thigh, or abdomen  . dexlansoprazole (DEXILANT) 60 MG capsule Take 60 mg by mouth daily.  Marland Kitchen docusate sodium (COLACE) 100 MG capsule Take 100 mg by mouth at bedtime.  Marland Kitchen ezetimibe (ZETIA) 10 MG tablet Take 10 mg by mouth at bedtime.   Marland Kitchen FERROUS SULFATE PO Take 22.5 mg by mouth daily.  Marland Kitchen levothyroxine (SYNTHROID, LEVOTHROID) 50 MCG tablet Take 50 mcg by mouth daily before breakfast.   . lovastatin (MEVACOR) 40 MG tablet Take 40 mg by mouth at bedtime.  . metoprolol tartrate (LOPRESSOR) 25 MG tablet TAKE 1 TABLET ONCE DAILY.  . Multiple Vitamin (MULTIVITAMIN WITH MINERALS) TABS tablet Take 1 tablet by mouth daily.  Vladimir Faster Glycol-Propyl Glycol (SYSTANE) 0.4-0.3 % GEL ophthalmic gel Place 1 application into both eyes at bedtime.  Vladimir Faster Glycol-Propyl Glycol (SYSTANE) 0.4-0.3 % SOLN Place 1 drop into both eyes daily.   . pramipexole (MIRAPEX) 1 MG tablet Take 1 tablet (1 mg total) by mouth 3 (three) times daily.  . Skin Protectants, Misc. (EUCERIN) cream Apply 1 application topically daily.   Marland Kitchen trimethoprim (TRIMPEX) 100 MG tablet Take 100 mg by mouth daily.  Marland Kitchen ALPRAZolam (XANAX) 0.5 MG tablet 1 tab 1 hour prior to MRI , may repeat once if necessary  . traMADol (ULTRAM) 50 MG tablet Take 1 tablet (50 mg total) by mouth 3 (three) times daily as needed. (Patient not taking: Reported on 10/09/2018)  . traMADol (ULTRAM-ER) 200 MG 24 hr tablet Take 1 tablet (200 mg total) by mouth daily. (Patient not taking: Reported on 10/09/2018)   No facility-administered encounter medications on file as of 10/27/2018.     Activities of Daily Living In your present state  of health, do you have any difficulty performing the following activities: 10/27/2018  Hearing? N  Vision? N  Difficulty concentrating or making decisions? N  Walking or climbing stairs? Y  Dressing or bathing? N  Doing errands, shopping? N  Preparing Food and eating ? N  Using the Toilet? N  In the past six months, have you accidently leaked urine? Y  Do you have problems with loss of bowel control? N  Managing your Medications? N  Managing your Finances? N  Housekeeping or managing your Housekeeping? N  Some recent data might be hidden    Patient Care Team: Blanchie Serve, MD as PCP - General (Internal Medicine) [name], MD as PCP - Cardiology (Cardiology) [name], MD (Neurosurgery) [name], MD (Neurology) [name], MD as Consulting Physician (Gastroenterology) [name], MD as Consulting Physician (Urology) [name], Man X, NP as Nurse Practitioner (Internal Medicine)    Assessment:   This is a routine wellness examination for [facility].  Exercise Activities and Dietary recommendations Current Exercise Habits: Structured exercise class, Type of exercise: strength training/weights, Time (Minutes): 30, Frequency (Times/Week): 2, Weekly Exercise (Minutes/Week): 60, Intensity: Mild, Exercise limited by: orthopedic condition(s)  Goals   None     Fall Risk Fall Risk  10/27/2018 10/09/2018 04/21/2018 11/15/2017 02/13/2017  Falls in the past year? 1 Yes No No Yes  Number falls in past yr: 0 1 - - 2 or more  Injury with Fall? 1 Yes - - Yes  Comment - - - - stitches in forehead, fx finger  Risk for fall due to : - - - - Impaired mobility;Impaired balance/gait  Risk for fall due to: Comment - - - - -  Is the patient's home free of loose throw rugs in walkways, pet beds, electrical cords, etc?   yes      Grab bars in the bathroom? yes      Handrails on the stairs?   yes      Adequate lighting?   yes  Timed Get Up and Go performed: 25  seconds  Depression Screen PHQ 2/9 Scores 10/27/2018 10/09/2018 07/08/2018  PHQ - 2 Score 1 0 2  PHQ- 9 Score - - 13     Cognitive Function MMSE - Mini Mental State Exam 07/21/2018 04/21/2018 04/08/2018 10/13/2015  Not completed: - - (No Data) -  Orientation to time 3 3 4 5   Orientation to Place 5 5 5 4   Registration 3 3 3 3   Attention/ Calculation 5 5 5 4   Recall 2 2 1 2   Language- name 2 objects 2 2 2 2   Language- repeat 1 1 1  0  Language- follow 3 step command 3 3 3 3   Language- read & follow direction 1 1 1 1   Write a sentence 1 1 1 1   Copy design 0 0 1 0  Total score 26 26 27 25         Immunization History  Administered Date(s) Administered  . Influenza Split 09/09/2013  . Influenza, High Dose Seasonal PF 09/12/2018  . Influenza,inj,Quad PF,6+ Mos 09/11/2016  . Pneumococcal Conjugate-13 04/15/2014  . Pneumococcal Polysaccharide-23 09/28/2005  . Tdap 09/01/2015  . Zoster Recombinat (Shingrix) 06/11/2018, 09/10/2018    Qualifies for Shingles Vaccine? Up to date, completed  Screening Tests Health Maintenance  Topic Date Due  . TETANUS/TDAP  08/31/2025  . INFLUENZA VACCINE  Completed  . DEXA SCAN  Completed  . PNA vac Low Risk Adult  Completed    Cancer Screenings: Lung: Low Dose CT Chest recommended if Age 46-80 years, 30 pack-year currently smoking OR have quit w/in 15years. Patient does not qualify. Breast:  Up to date on Mammogram? Yes   Up to date of Bone Density/Dexa? Yes Colorectal: up to date  Additional Screenings:  Hepatitis C Screening: declined     Plan:    I have personally reviewed and addressed the Medicare Annual Wellness questionnaire and have noted the following in the patient's chart:  A. Medical and social history B. Use of alcohol, tobacco or illicit drugs  C. Current medications and supplements D. Functional ability and status E.  Nutritional status F.  Physical activity G. Advance directives H. List of other physicians I.   Hospitalizations, surgeries, and ER visits in previous 12 months J.  [place] to include hearing, vision, cognitive, depression L. Referrals and appointments - none  In addition, I have reviewed and discussed with patient certain preventive protocols, quality metrics, and best practice recommendations. A written personalized care plan for preventive services as well as general preventive health recommendations were provided to patient.  See attached scanned questionnaire for additional information.   Signed,   Tyson Dense, RN Nurse Health Advisor  Patient Concerns: sees dermatologist for spots on lips but would like a referral for different one for another opinion

## 2018-10-28 ENCOUNTER — Encounter: Payer: Self-pay | Admitting: Nurse Practitioner

## 2018-10-28 NOTE — Progress Notes (Signed)
[facility]  PATIENT: [name] DOB: Sep 08, 1931   REASON FOR VISIT: Follow-up for Parkinson's disease mild memory loss HISTORY FROM: Patient and husband    HISTORY OF PRESENT ILLNESS:UPDATE 11/20/2019CM [name], 84 year old female returns for follow-up with history of Parkinson's disease and mild memory loss.  When last seen she was having increased confusion and hallucinations she had also had a fall.  CT of the head was done on 08/08/2018 without recent or traumatic finding but patient does have generalized atrophy.  She ambulates with a rolling walker, she denies any falls.  She denies any hallucinations since last seen.  She is currently on Mirapex and carbidopa levodopa for Parkinson symptoms which are well controlled.  She denies any freezing episodes, any difficulty turning over in bed any swallowing difficulty.  She returns for reevaluation     UPDATE 8/12/2019CM [name], 84 year old female returns for follow-up with history of Parkinson's disease and mild memory loss.  Her primary care wanted her to be evaluated for  increased confusion and hallucinations.  Patient had a fall the first of July, slipped and hit the back of her head.  She claims she has had increased confusion and hallucinations since that time.  She is seeing her dead parents and talking with them.  She is also thinking her husband is her father.  She claims she has not had any hallucinations in the last week.  Her primary care Dr. Guinevere Ferrari reports stable TSH WBC CMP.  There are no new medications on her medication list.  Concern for Lewy body dementia along with patient's history of Parkinson's disease.  Patient also has some depression which may be contributing to her memory difficulties.  She ambulates with a rolling walker.  She has also had a recent move and her skilled facility to a different floor.  She returns for reevaluation    UPDATE 04/21/2018 CM [name], 84 year old female  returns for follow-up with a history of Parkinson's disease and mild memory loss.  She reports she has difficulty standing to walk some time and sometimes her legs do not want to go she shuffle.  She has not had any physical therapy in quite some time she reports.  She ambulates with a rolling walker.  She denies any falls she denies side effects to her Sinemet and Mirapex.  Memory score 26 out of 30. ADLs 6 pt is independent IADLs 4 low function.  She no longer drives.  She returns for reevaluation   UPDATE (11/15/17, VRP): Since last visit, doing about the same. Tolerating meds. No alleviating or aggravating factors. Memory and gait continue to be issues. Planning to go to [place] next spring.   UPDATE 02/13/17: Since last visit, feels stable. Tolerating meds. Some memory issues, esp when tired.   UPDATE 08/17/16: Since last visit, continues with intermittent shaking sens on "inside". Sometimes forgets the mid day dose of carb/levo.  UPDATE 02/16/16: Since last visit, doing well. Memory loss stable. Having some pain in feet. Now has spinal cord stim for back pain issues.   UPDATE 10/13/15: Since last visit, more memory loss problems. Forgetting order when playing bridge. Forgetting recent conversations and names. Had fall in Aug 2016, with right foot fracture.   UPDATE 07/06/15: More issues with kidney stones, UTIs, shoulder pain. No wearing off. No tremor. More balance and freezing issues. Short term memory problems continue. Patient is in denial about need to use walker and her own memory problems.   UPDATE 03/02/15:  Since last visit, patient's balance has worsened. She fell recently (02/26/15), with right non-displaced distal right radial fracture. Notes more tremor and wearing off around 4pm everyday. Currently on carb/levo 1.5tab TID and pramipexole 1mg  TID. Sometimes takes a 4th dose of carb/levo 1.5tabs. Had been using a walker, mainly at night time, but did not use at time of fall.  UPDATE 09/03/14:  Since last visit, tremor is well controlled. Gait and freezing are stable,. Doing well on (carb/levo 1.5 tabs + pramipexole 1mg ) taken TID (sometimes QID, with extra late evening dose if wearing off is notable). Having more foot pain, numbness, drooling prob, flushing, anxiety. Saw psychology last visit, then rec to see psychiatry, but sxs improved and the psychiatrist who was recommended to them was not taking new patients.   UPDATE 02/24/14: Since last visit, has increased carb/levo up to 1 tab 4x per day; has also incr pramipexole up to 1mg  4x per day (for convenience of remembering, even though we normally dose BID or TID). Also with more anxiety, decr confidence, more tremor in right upper ext. Also reports some right shoulder, bilateral knee, low back pain. Has noticed some freezing of gait. Has appt with psychology later today.  UPDATE 08/26/13: Since last visit, noticing some wearing off in early evening. Takes carb/levo 6:30am, noon, 9pm. Also getting more "worked up" lately, reading about PD online. No falls. Uses cane and walker. More fatigue and mental exhaustion.  UPDATE 02/04/13: Since last visit, has had some minor falls. tremor and PD sxs better on carb/levo. No anxiety. Some drooling and hypophonia. Not using walker or cane all the time. constipation stable.  UPDATE 10/06/12: Since last visit, had another back surgery (april 2013), dx'd with CHF, dx'd with sjogrens, also with fall in August 2013 with pelvic fracture. Reports good tremor control. Some freezing of gait.  UPDATE 02/13/12: Now on pramipexole 1mg  QID (7am, noon, 5pm, 10pm). Some wearing off around 5pm. Also, had rxn to forteo, now with right leg pain. Also getting lumbar radiculopathy eval per ortho.  UPDATE 08/14/11: Tremor developing around 4pm now.  More anxiety per husband.  Also with UTI x 2.  Now dx'd with left foot fractures (? "stubbed" toes; no falls).  UPDATE 04/27/11: Tolerating pramipexole 1mg  TID (7am, 1-2pm,  7p).  some wearing off right at 7p.  feels unsteady, and has ongoing back problems and pain.  UPDATE 01/24/11: Doing slightly better with pramipexole 1mg  BID.  Tremor returns later in the day before evening dose.  Still with gait and balance diff.    PRIOR HPI: 84 year old female with history of hypertension, hypercholesterolemia, hypothyroidism, fibromyalgia, restless leg syndrome, presenting for evaluation of tremor for the past 3 years. She is here with her husband. Approximately 3 years ago patient developed intermittent tremor in her left hand particularly in her thumb and fingers.  Tremor is most noticeable at rest and in the evening.  In June 2011, patient tripped, fell and broke her left wrist and forearm.  This was repaired surgically and in a cast; during this time and since then her tremor has improved. Patient also has history of restless leg syndrome for the past 5 years. She's been on pramipexole for number of years (0.5mg  qhs) and recently increased to 1 mg qhs a few weeks ago.  The patient's mother also had restless leg syndrome. Patient has a diagnosis of iron deficiency anemia but is not on iron replacement.     REVIEW OF SYSTEMS: Full 14 system review of systems  performed and notable only for those listed, all others are neg:  Constitutional: neg  Cardiovascular: neg Ear/Nose/Throat: Hearing loss Skin: neg Eyes: Light sensitivity Respiratory: neg Gastroitestinal: Constipation,  Hematology/Lymphatic: Anemia Endocrine: neg Musculoskeletal: Walking difficulty, joint pain Allergy/Immunology: neg Neurological: Memory loss,  Psychiatric: Depression anxiety,  Sleep : neg   ALLERGIES: Allergies  Allergen Reactions  . Lactose Other (See Comments)    abd pain, lactose intolerant  . Sulfa Antibiotics Other (See Comments)    Headache, very sick  . Codeine Other (See Comments)    Reaction:  Headaches and nightmares   . Lactose Intolerance (Gi) Nausea And Vomiting  . Latex  Rash  . Lyrica [Pregabalin] Swelling and Other (See Comments)    Reaction:  Leg swelling  . Other Other (See Comments)    Pt states that pain medications give her nightmares.    . Plaquenil [Hydroxychloroquine Sulfate] Other (See Comments)    Reaction:  GI upset   . Reglan [Metoclopramide] Other (See Comments)    Reaction:  GI upset   . Requip [Ropinirole Hcl] Other (See Comments)    Reaction:  GI upset   . Septra [Sulfamethoxazole-Trimethoprim] Nausea And Vomiting  . Shellfish Allergy Nausea And Vomiting    HOME MEDICATIONS: Outpatient Medications Prior to Visit  Medication Sig Dispense Refill  . acetaminophen (TYLENOL) 325 MG tablet Take 325 mg by mouth daily.    Marland Kitchen aspirin EC 81 MG tablet Take 81 mg by mouth at bedtime.     . Calcium Carb-Cholecalciferol (CALCIUM 1000 + D) 1000-800 MG-UNIT TABS Take 1 tablet by mouth daily.    . carbidopa-levodopa (SINEMET IR) 25-100 MG tablet Take 2 tablets by mouth 3 (three) times daily. 540 tablet 4  . Cholecalciferol (VITAMIN D) 2000 units CAPS Take 2,000 Units by mouth daily.    Marland Kitchen denosumab (PROLIA) 60 MG/ML SOLN injection Inject 60 mg into the skin every 6 (six) months. Administer in upper arm, thigh, or abdomen    . dexlansoprazole (DEXILANT) 60 MG capsule Take 60 mg by mouth daily.    Marland Kitchen docusate sodium (COLACE) 100 MG capsule Take 100 mg by mouth at bedtime.    Marland Kitchen ezetimibe (ZETIA) 10 MG tablet Take 10 mg by mouth at bedtime.     Marland Kitchen FERROUS SULFATE PO Take 22.5 mg by mouth daily.    Marland Kitchen levothyroxine (SYNTHROID, LEVOTHROID) 50 MCG tablet Take 50 mcg by mouth daily before breakfast.     . lovastatin (MEVACOR) 40 MG tablet Take 40 mg by mouth at bedtime.    . metoprolol tartrate (LOPRESSOR) 25 MG tablet TAKE 1 TABLET ONCE DAILY. 90 tablet 0  . Multiple Vitamin (MULTIVITAMIN WITH MINERALS) TABS tablet Take 1 tablet by mouth daily.    Vladimir Faster Glycol-Propyl Glycol (SYSTANE) 0.4-0.3 % GEL ophthalmic gel Place 1 application into both eyes at  bedtime.    Vladimir Faster Glycol-Propyl Glycol (SYSTANE) 0.4-0.3 % SOLN Place 1 drop into both eyes daily.     . pramipexole (MIRAPEX) 1 MG tablet Take 1 tablet (1 mg total) by mouth 3 (three) times daily. 270 tablet 4  . Skin Protectants, Misc. (EUCERIN) cream Apply 1 application topically daily.     . traMADol (ULTRAM) 50 MG tablet Take 1 tablet (50 mg total) by mouth 3 (three) times daily as needed. 30 tablet 0  . traMADol (ULTRAM-ER) 200 MG 24 hr tablet Take 1 tablet (200 mg total) by mouth daily. 30 tablet 0  . trimethoprim (TRIMPEX) 100 MG tablet Take 100  mg by mouth daily.    Marland Kitchen ALPRAZolam (XANAX) 0.5 MG tablet 1 tab 1 hour prior to MRI , may repeat once if necessary 2 tablet 0   No facility-administered medications prior to visit.     PAST MEDICAL HISTORY: Past Medical History:  Diagnosis Date  . Anemia   . Anxiety   . Aortic stenosis    moderate by echo 2019  . Arthritis   . Broken arm    right   . Chronic diastolic CHF (congestive heart failure) ([place])   . Family history of adverse reaction to anesthesia    pt. states sister vomits  . Fibromyalgia   . Fracture of thumb 09/01/2015   right  . Fracture, foot 09/01/2015   right  . GERD (gastroesophageal reflux disease)   . H/O hiatal hernia   . History of bronchitis   . History of kidney stones   . Hypercholesterolemia   . Hypertension    dr t turner  . Hypothyroidism   . Neuropathy   . Osteoporosis   . Parkinson disease ([place])   . PVD (peripheral vascular disease) ([facility])    99% stenosis of left anteiror tibial artery, mod stenosis of left distal SFA and popliteal artery followed by [name]  . RBBB    noted on EKG 2018  . Restless leg   . Shortness of breath   . Sjogren's disease ([place])   . SOB (shortness of breath)    chronic due to diastolic dysfunction, deconditioning, obesity  . Tremor     PAST SURGICAL HISTORY: Past Surgical History:  Procedure Laterality Date  . ABDOMINAL HYSTERECTOMY    . BACK SURGERY      4 back surgeries,   lumbar fusion  . CARDIAC CATHETERIZATION  2006   normal  . EYE SURGERY Bilateral    cateracts  . falls     variious fall, broken wrist,and toes  . FRACTURE SURGERY Right April 2016   Wrist, Pt. fell  . RIGHT/LEFT HEART CATH AND CORONARY ANGIOGRAPHY N/A 02/05/2018   Procedure: RIGHT/LEFT HEART CATH AND CORONARY ANGIOGRAPHY;  Surgeon: Troy Sine, MD;  Location: [facility];  Service: Cardiovascular;  Laterality: N/A;  . SPINAL CORD STIMULATOR INSERTION N/A 09/09/2015   Procedure: LUMBAR SPINAL CORD STIMULATOR INSERTION;  Surgeon: Clydell Hakim, MD;  Location: [facility];  Service: Neurosurgery;  Laterality: N/A;  LUMBAR SPINAL CORD STIMULATOR INSERTION  . SPINE SURGERY  April 2013   Back X's 4  . TOTAL HIP ARTHROPLASTY     right  . WRIST FRACTURE SURGERY Bilateral     FAMILY HISTORY: Family History  Problem Relation Age of Onset  . Heart attack Mother   . Restless legs syndrome Mother   . Heart failure Mother   . Heart disease Mother   . Hypertension Mother   . COPD Father     SOCIAL HISTORY: Social History   Socioeconomic History  . Marital status: Married    Spouse name: [name]  . Number of children: 2  . Years of education: HS  . Highest education level: Not on file  Occupational History    Comment: Homemaker  Social Needs  . Financial resource strain: Not hard at all  . Food insecurity:    Worry: Never true    Inability: Never true  . Transportation needs:    Medical: No    Non-medical: No  Tobacco Use  . Smoking status: Never Smoker  . Smokeless tobacco: Never Used  Substance and Sexual Activity  . Alcohol use: No    Alcohol/week: 0.0 standard drinks  . Drug use: No  . Sexual activity: Never  Lifestyle  . Physical activity:    Days per week: 2 days    Minutes per session: 30 min  . Stress: Only a little  Relationships  . Social connections:    Talks on phone: More than three times a week    Gets together:  More than three times a week    Attends religious service: More than 4 times per year    Active member of club or organization: Yes    Attends meetings of clubs or organizations: More than 4 times per year    Relationship status: Married  . Intimate partner violence:    Fear of current or ex partner: No    Emotionally abused: No    Physically abused: No    Forced sexual activity: No  Other Topics Concern  . Not on file  Social History Narrative   Patient lives at home with her spouse. Married in 1957. Two people lives in the home, no pets.    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    Caffeine Use: tea     PHYSICAL EXAM  Vitals:   10/29/18 0829  BP: 116/64  Pulse: 85  Weight: 161 lb (73 kg)  Height: 5\' 2"  (1.575 m)   Body mass index is 29.45 kg/m.  Generalized: Well developed, obese female in  no acute distress , well-groomed, mild masking of the face. Neg Myersons sign Head: normocephalic and atraumatic,. Oropharynx benign  Neck: Supple,  Cardiac: Regular rate rhythm, no murmur  Musculoskeletal: No deformity   Neurological examination   Mentation: Alert. MMSE not repeated MMSE - Mini Mental State Exam 07/21/2018 04/21/2018 04/08/2018  Not completed: - - (No Data)  Orientation to time 3 3 4   Orientation to Place 5 5 5   Registration 3 3 3   Attention/ Calculation 5 5 5   Recall 2 2 1   Language- name 2 objects 2 2 2   Language- repeat 1 1 1   Language- follow 3 step command 3 3 3   Language- read & follow direction 1 1 1   Write a sentence 1 1 1   Copy design 0 0 1  Total score 26 26 27     Follows all commands speech and language fluent.   Cranial nerve II-XII: Pupils were equal round reactive to light extraocular movements were full, visual field were full on confrontational test. Facial sensation and strength were normal. hearing was intact to finger rubbing bilaterally. Uvula tongue midline. head turning and shoulder  shrug were normal and symmetric.Tongue protrusion into cheek strength was normal. Motor: normal bulk and tone, 4/5 strength in  the upper and lower extremity, no bradykinesia  no resting  or postural tremor seen. Sensory: normal and symmetric to light touch,  Coordination: finger-nose-finger, heel-to-shin bilaterally, no dysmetria Reflexes: Brachioradialis 2/2, biceps 2/2, triceps 2/2, patellar absent , Achilles absent,  plantar responses were flexor bilaterally. Gait and Station: Rising up from seated position with push off and 1+assist,  ambulates with rolling walker, no freezing spells noted, no difficulty with turns  DIAGNOSTIC DATA (LABS, IMAGING, TESTING) - I reviewed patient records, labs, notes, testing and imaging myself where available.  Lab Results  Component Value Date   WBC 5.6 07/01/2018   HGB 9.6 (L) 07/01/2018   HCT 29.5 (L) 07/01/2018   MCV 87.3  07/01/2018   PLT 334 07/01/2018      Component Value Date/Time   NA 141 07/01/2018 0715   NA 143 02/03/2018 1220   K 4.4 07/01/2018 0715   CL 108 07/01/2018 0715   CO2 25 07/01/2018 0715   GLUCOSE 96 07/01/2018 0715   BUN 15 07/01/2018 0715   BUN 20 02/03/2018 1220   CREATININE 0.99 (H) 07/01/2018 0715   CALCIUM 9.6 07/01/2018 0715   PROT 6.5 07/01/2018 0715   PROT 6.1 01/31/2018 0821   ALBUMIN 4.2 01/31/2018 0821   AST 18 07/01/2018 0715   ALT 9 07/01/2018 0715   ALKPHOS 56 01/31/2018 0821   BILITOT 0.4 07/01/2018 0715   BILITOT 0.4 01/31/2018 0821   GFRNONAA 52 (L) 07/01/2018 0715   GFRAA 61 07/01/2018 0715   Lab Results  Component Value Date   CHOL 164 07/01/2018   HDL 60 07/01/2018   LDLCALC 77 07/01/2018   TRIG 161 (H) 07/01/2018   CHOLHDL 2.7 07/01/2018    Lab Results  Component Value Date   TSH 1.77 07/01/2018      ASSESSMENT AND PLAN 84 y.o. female with hypertension, hypercholesterolemia, hypothyroidism, fibromyalgia, restless leg syndrome and parkinson's disease.  Continues with shoulder  pain, foot pain, memory loss and gait difficulty, likely related to progression of parkinson's disease.Neuropathy due to Sjogren's syndrome [facility]) CT of the head was done on 08/08/2018 without recent or traumatic finding but patient does have generalized atrophy.Marland Kitchen  PLAN: Continue pramipexole 1mg  three time per day will refill Continue carb/levo to 2 tabs three times per day will refill Continue using rolling walker at all times;  stay active and continue stimulating activities for memory, try to exercise at least 30 min daily by walking  F/U in 6 months next with [place], [facility], [facility], APRN  [facility] [address] [place], [place] [address] [phone]

## 2018-10-29 ENCOUNTER — Encounter: Payer: Self-pay | Admitting: Nurse Practitioner

## 2018-10-29 ENCOUNTER — Ambulatory Visit (INDEPENDENT_AMBULATORY_CARE_PROVIDER_SITE_OTHER): Payer: Medicare Other | Admitting: Nurse Practitioner

## 2018-10-29 VITALS — BP 116/64 | HR 85 | Ht 62.0 in | Wt 161.0 lb

## 2018-10-29 DIAGNOSIS — M6281 Muscle weakness (generalized): Secondary | ICD-10-CM | POA: Diagnosis not present

## 2018-10-29 DIAGNOSIS — G20 Parkinson's disease: Secondary | ICD-10-CM | POA: Diagnosis not present

## 2018-10-29 DIAGNOSIS — I779 Disorder of arteries and arterioles, unspecified: Secondary | ICD-10-CM | POA: Diagnosis not present

## 2018-10-29 DIAGNOSIS — R413 Other amnesia: Secondary | ICD-10-CM

## 2018-10-29 DIAGNOSIS — R269 Unspecified abnormalities of gait and mobility: Secondary | ICD-10-CM

## 2018-10-29 DIAGNOSIS — M25511 Pain in right shoulder: Secondary | ICD-10-CM | POA: Diagnosis not present

## 2018-10-29 DIAGNOSIS — R2689 Other abnormalities of gait and mobility: Secondary | ICD-10-CM | POA: Diagnosis not present

## 2018-10-29 DIAGNOSIS — G20A1 Parkinson's disease without dyskinesia, without mention of fluctuations: Secondary | ICD-10-CM

## 2018-10-29 MED ORDER — CARBIDOPA-LEVODOPA 25-100 MG PO TABS: 2.0000 | ORAL_TABLET | Freq: Three times a day (TID) | ORAL | 3 refills | Status: DC

## 2018-10-29 MED ORDER — PRAMIPEXOLE DIHYDROCHLORIDE 1 MG PO TABS: 1.0000 mg | ORAL_TABLET | Freq: Three times a day (TID) | ORAL | 3 refills | Status: DC

## 2018-10-29 NOTE — Patient Instructions (Signed)
Continue pramipexole 1mg  three time per day will refill Continue carb/levo to 2 tabs three times per day will refill Continue using rolling walker at all times;  stay active and continue stimulating activities for memory, try to exercise at least 30 min daily by walking  F/U in 6 months next with [place]

## 2018-11-03 ENCOUNTER — Other Ambulatory Visit: Payer: Self-pay | Admitting: *Deleted

## 2018-11-03 DIAGNOSIS — M25511 Pain in right shoulder: Secondary | ICD-10-CM | POA: Diagnosis not present

## 2018-11-03 DIAGNOSIS — R2689 Other abnormalities of gait and mobility: Secondary | ICD-10-CM | POA: Diagnosis not present

## 2018-11-03 DIAGNOSIS — M6281 Muscle weakness (generalized): Secondary | ICD-10-CM | POA: Diagnosis not present

## 2018-11-04 ENCOUNTER — Telehealth: Payer: Self-pay | Admitting: *Deleted

## 2018-11-04 NOTE — Telephone Encounter (Signed)
Spoke with the patient's husband, she stated that she wants to wait a while longer before she see's another dermatologist regarding her lips. He stated that she had a biopsy and the results came back as precancerous and they are now seeing a bit of difference. So will follow up in a few months to see if a second opinion is needed.

## 2018-11-05 NOTE — Progress Notes (Signed)
Spoke with the patient's husband, she stated that she wants to wait a while longer before she see's another dermatologist regarding her lips. He stated that she had a biopsy and the results came back as precancerous and they are now seeing a bit of difference. So will follow up in a few months to see if a second opinion is needed.

## 2018-11-10 DIAGNOSIS — M25511 Pain in right shoulder: Secondary | ICD-10-CM | POA: Diagnosis not present

## 2018-11-10 DIAGNOSIS — R2689 Other abnormalities of gait and mobility: Secondary | ICD-10-CM | POA: Diagnosis not present

## 2018-11-10 DIAGNOSIS — M6281 Muscle weakness (generalized): Secondary | ICD-10-CM | POA: Diagnosis not present

## 2018-11-12 DIAGNOSIS — M25511 Pain in right shoulder: Secondary | ICD-10-CM | POA: Diagnosis not present

## 2018-11-12 DIAGNOSIS — M6281 Muscle weakness (generalized): Secondary | ICD-10-CM | POA: Diagnosis not present

## 2018-11-12 DIAGNOSIS — R2689 Other abnormalities of gait and mobility: Secondary | ICD-10-CM | POA: Diagnosis not present

## 2018-11-17 DIAGNOSIS — R2689 Other abnormalities of gait and mobility: Secondary | ICD-10-CM | POA: Diagnosis not present

## 2018-11-17 DIAGNOSIS — M25511 Pain in right shoulder: Secondary | ICD-10-CM | POA: Diagnosis not present

## 2018-11-17 DIAGNOSIS — M6281 Muscle weakness (generalized): Secondary | ICD-10-CM | POA: Diagnosis not present

## 2018-11-20 DIAGNOSIS — R399 Unspecified symptoms and signs involving the genitourinary system: Secondary | ICD-10-CM | POA: Diagnosis not present

## 2018-11-24 ENCOUNTER — Encounter: Payer: Self-pay | Admitting: Cardiology

## 2018-12-10 DIAGNOSIS — R2681 Unsteadiness on feet: Secondary | ICD-10-CM | POA: Diagnosis not present

## 2018-12-10 DIAGNOSIS — M25562 Pain in left knee: Secondary | ICD-10-CM | POA: Diagnosis not present

## 2018-12-10 DIAGNOSIS — R278 Other lack of coordination: Secondary | ICD-10-CM | POA: Diagnosis not present

## 2018-12-10 DIAGNOSIS — G20 Parkinson's disease: Secondary | ICD-10-CM | POA: Diagnosis not present

## 2018-12-10 DIAGNOSIS — M25561 Pain in right knee: Secondary | ICD-10-CM | POA: Diagnosis not present

## 2018-12-10 DIAGNOSIS — M25531 Pain in right wrist: Secondary | ICD-10-CM | POA: Diagnosis not present

## 2018-12-10 DIAGNOSIS — G5601 Carpal tunnel syndrome, right upper limb: Secondary | ICD-10-CM | POA: Diagnosis not present

## 2018-12-10 DIAGNOSIS — R41841 Cognitive communication deficit: Secondary | ICD-10-CM | POA: Diagnosis not present

## 2018-12-10 DIAGNOSIS — S52501S Unspecified fracture of the lower end of right radius, sequela: Secondary | ICD-10-CM | POA: Diagnosis not present

## 2018-12-10 DIAGNOSIS — R29898 Other symptoms and signs involving the musculoskeletal system: Secondary | ICD-10-CM | POA: Diagnosis not present

## 2018-12-11 ENCOUNTER — Other Ambulatory Visit: Payer: Self-pay

## 2018-12-11 ENCOUNTER — Ambulatory Visit (HOSPITAL_COMMUNITY): Payer: Medicare Other | Attending: Cardiology

## 2018-12-11 DIAGNOSIS — R2689 Other abnormalities of gait and mobility: Secondary | ICD-10-CM | POA: Diagnosis not present

## 2018-12-11 DIAGNOSIS — I350 Nonrheumatic aortic (valve) stenosis: Secondary | ICD-10-CM | POA: Diagnosis not present

## 2018-12-11 DIAGNOSIS — M25511 Pain in right shoulder: Secondary | ICD-10-CM | POA: Diagnosis not present

## 2018-12-11 DIAGNOSIS — M6281 Muscle weakness (generalized): Secondary | ICD-10-CM | POA: Diagnosis not present

## 2018-12-12 ENCOUNTER — Other Ambulatory Visit: Payer: Self-pay | Admitting: Internal Medicine

## 2018-12-12 ENCOUNTER — Telehealth: Payer: Self-pay

## 2018-12-12 DIAGNOSIS — M810 Age-related osteoporosis without current pathological fracture: Secondary | ICD-10-CM | POA: Diagnosis not present

## 2018-12-12 DIAGNOSIS — N200 Calculus of kidney: Secondary | ICD-10-CM | POA: Diagnosis not present

## 2018-12-12 DIAGNOSIS — E039 Hypothyroidism, unspecified: Secondary | ICD-10-CM | POA: Diagnosis not present

## 2018-12-12 DIAGNOSIS — I350 Nonrheumatic aortic (valve) stenosis: Secondary | ICD-10-CM

## 2018-12-12 NOTE — Telephone Encounter (Signed)
-----   Message from Amanda Margarita, MD sent at 12/11/2018  4:38 PM EST ----- Please let patient know that echo showed normal LVF with increased stiffness of heart muscle, and moderate AS - echo is stable. repeat echo in 1 year.

## 2018-12-12 NOTE — Telephone Encounter (Signed)
Left the patient a detailed message to call back if questions.

## 2018-12-15 DIAGNOSIS — M25531 Pain in right wrist: Secondary | ICD-10-CM | POA: Diagnosis not present

## 2018-12-15 DIAGNOSIS — G20 Parkinson's disease: Secondary | ICD-10-CM | POA: Diagnosis not present

## 2018-12-15 DIAGNOSIS — M25511 Pain in right shoulder: Secondary | ICD-10-CM | POA: Diagnosis not present

## 2018-12-15 DIAGNOSIS — M25561 Pain in right knee: Secondary | ICD-10-CM | POA: Diagnosis not present

## 2018-12-15 DIAGNOSIS — R2681 Unsteadiness on feet: Secondary | ICD-10-CM | POA: Diagnosis not present

## 2018-12-15 DIAGNOSIS — M6281 Muscle weakness (generalized): Secondary | ICD-10-CM | POA: Diagnosis not present

## 2018-12-15 DIAGNOSIS — M25562 Pain in left knee: Secondary | ICD-10-CM | POA: Diagnosis not present

## 2018-12-15 DIAGNOSIS — R2689 Other abnormalities of gait and mobility: Secondary | ICD-10-CM | POA: Diagnosis not present

## 2018-12-15 DIAGNOSIS — R278 Other lack of coordination: Secondary | ICD-10-CM | POA: Diagnosis not present

## 2018-12-15 NOTE — Telephone Encounter (Signed)
Spoke with the patient, she is still concerned about her SOB. Dr. Radford Pax advised she go to pulmonology and she went and was unable to perform one of their tests so she has not been treated. I advised her to contact pulmonology again to see what is the next step. Sending to Dr. Radford Pax for reference.

## 2018-12-15 NOTE — Telephone Encounter (Signed)
Agree with followup with Pulmonary

## 2018-12-16 NOTE — Telephone Encounter (Signed)
Left a detailed message about Dr. Theodosia Blender recommendations.

## 2018-12-17 ENCOUNTER — Other Ambulatory Visit: Payer: Self-pay | Admitting: Nurse Practitioner

## 2018-12-17 ENCOUNTER — Other Ambulatory Visit: Payer: Self-pay | Admitting: Diagnostic Neuroimaging

## 2018-12-17 DIAGNOSIS — M25531 Pain in right wrist: Secondary | ICD-10-CM | POA: Diagnosis not present

## 2018-12-17 DIAGNOSIS — M25562 Pain in left knee: Secondary | ICD-10-CM | POA: Diagnosis not present

## 2018-12-17 DIAGNOSIS — M25561 Pain in right knee: Secondary | ICD-10-CM | POA: Diagnosis not present

## 2018-12-17 DIAGNOSIS — R278 Other lack of coordination: Secondary | ICD-10-CM | POA: Diagnosis not present

## 2018-12-17 DIAGNOSIS — R2681 Unsteadiness on feet: Secondary | ICD-10-CM | POA: Diagnosis not present

## 2018-12-17 DIAGNOSIS — G20 Parkinson's disease: Secondary | ICD-10-CM | POA: Diagnosis not present

## 2018-12-18 ENCOUNTER — Encounter: Payer: Self-pay | Admitting: Nurse Practitioner

## 2018-12-18 ENCOUNTER — Non-Acute Institutional Stay: Payer: Medicare Other | Admitting: Nurse Practitioner

## 2018-12-18 ENCOUNTER — Other Ambulatory Visit: Payer: Self-pay | Admitting: Diagnostic Neuroimaging

## 2018-12-18 DIAGNOSIS — B372 Candidiasis of skin and nail: Secondary | ICD-10-CM | POA: Diagnosis not present

## 2018-12-18 DIAGNOSIS — R0609 Other forms of dyspnea: Secondary | ICD-10-CM | POA: Diagnosis not present

## 2018-12-18 DIAGNOSIS — L600 Ingrowing nail: Secondary | ICD-10-CM | POA: Diagnosis not present

## 2018-12-18 DIAGNOSIS — I5032 Chronic diastolic (congestive) heart failure: Secondary | ICD-10-CM

## 2018-12-18 DIAGNOSIS — I10 Essential (primary) hypertension: Secondary | ICD-10-CM

## 2018-12-18 MED ORDER — NYSTATIN 100000 UNIT/GM EX POWD: Freq: Two times a day (BID) | CUTANEOUS | 5 refills | Status: DC | PRN

## 2018-12-18 NOTE — Progress Notes (Signed)
Location:   clinic [facility]   Place of Service:  Clinic (12) Provider: Marlana Latus NP  Code Status: DNR Goals of Care: [place] 10/27/2018  Does Patient Have a Medical Advance Directive? Yes  Type of Advance Directive Out of facility DNR (pink MOST or yellow form)  Does patient want to make changes to medical advance directive? No - Patient declined  Copy of [place] in Chart? -  Would patient like information on creating a medical advance directive? -  Pre-existing out of facility DNR order (yellow form or pink MOST form) Yellow form placed in chart (order not valid for inpatient use)     Chief Complaint  Patient presents with  . Medical Management of Chronic Issues    c/o shortness of breath, chafted under breast, (R) sore toe    HPI: Patient is a 85 y.o. female seen today for an acute visit for hacking DOE, not new, saw Pulmonology per patient, she couldn't do lung function test,  she denied chest pain/pressure, paroxysmal nocturnal orthopnea, or palpitation, no O2 desaturation. She does have some DOE. Hx of GERD, on Dexlansoprazole 60mg  qd. HTN, blood pressure is controlled on Metoprolol 25mg  qd. CHF, presently no s/s of fluid overload, EF 65% 01/31/18, stress test 01/31/18, f/u Dr Radford Pax. C/o recurrent skin irritation/rash/redness under breat skin folds, abd skin folds, groins.   Past Medical History:  Diagnosis Date  . Anemia   . Anxiety   . Aortic stenosis    moderate by echo 2019  . Arthritis   . Broken arm    right   . Chronic diastolic CHF (congestive heart failure) ([place])   . Family history of adverse reaction to anesthesia    pt. states sister vomits  . Fibromyalgia   . Fracture of thumb 09/01/2015   right  . Fracture, foot 09/01/2015   right  . GERD (gastroesophageal reflux disease)   . H/O hiatal hernia   . History of bronchitis   . History of kidney stones   . Hypercholesterolemia   . Hypertension    dr t turner  .  Hypothyroidism   . Neuropathy   . Osteoporosis   . Parkinson disease ([place])   . PVD (peripheral vascular disease) ([facility])    99% stenosis of left anteiror tibial artery, mod stenosis of left distal SFA and popliteal artery followed by [name]  . RBBB    noted on EKG 2018  . Restless leg   . Shortness of breath   . Sjogren's disease ([place])   . SOB (shortness of breath)    chronic due to diastolic dysfunction, deconditioning, obesity  . Tremor     Past Surgical History:  Procedure Laterality Date  . ABDOMINAL HYSTERECTOMY    . BACK SURGERY     4 back surgeries,   lumbar fusion  . CARDIAC CATHETERIZATION  2006   normal  . EYE SURGERY Bilateral    cateracts  . falls     variious fall, broken wrist,and toes  . FRACTURE SURGERY Right April 2016   Wrist, Pt. fell  . RIGHT/LEFT HEART CATH AND CORONARY ANGIOGRAPHY N/A 02/05/2018   Procedure: RIGHT/LEFT HEART CATH AND CORONARY ANGIOGRAPHY;  Surgeon: Troy Sine, MD;  Location: [facility];  Service: Cardiovascular;  Laterality: N/A;  . SPINAL CORD STIMULATOR INSERTION N/A 09/09/2015   Procedure: LUMBAR SPINAL CORD STIMULATOR INSERTION;  Surgeon: Clydell Hakim, MD;  Location: [facility];  Service: Neurosurgery;  Laterality: N/A;  LUMBAR SPINAL CORD STIMULATOR INSERTION  . SPINE SURGERY  April 2013   Back X's 4  . TOTAL HIP ARTHROPLASTY     right  . WRIST FRACTURE SURGERY Bilateral     Allergies  Allergen Reactions  . Lactose Other (See Comments)    abd pain, lactose intolerant  . Morphine Other (See Comments)  . Sulfa Antibiotics Other (See Comments)    Headache, very sick  . Codeine Other (See Comments)    Reaction:  Headaches and nightmares   . Lactose Intolerance (Gi) Nausea And Vomiting  . Latex Rash  . Lyrica [Pregabalin] Swelling and Other (See Comments)    Reaction:  Leg swelling  . Other Other (See Comments)    Pt states that pain medications give her nightmares.    . Plaquenil [Hydroxychloroquine Sulfate]  Other (See Comments)    Reaction:  GI upset   . Reglan [Metoclopramide] Other (See Comments)    Reaction:  GI upset   . Requip [Ropinirole Hcl] Other (See Comments)    Reaction:  GI upset   . Septra [Sulfamethoxazole-Trimethoprim] Nausea And Vomiting  . Shellfish Allergy Nausea And Vomiting    Allergies as of 12/18/2018      Reactions   Lactose Other (See Comments)   abd pain, lactose intolerant   Morphine Other (See Comments)   Sulfa Antibiotics Other (See Comments)   Headache, very sick   Codeine Other (See Comments)   Reaction:  Headaches and nightmares    Lactose Intolerance (gi) Nausea And Vomiting   Latex Rash   Lyrica [pregabalin] Swelling, Other (See Comments)   Reaction:  Leg swelling   Other Other (See Comments)   Pt states that pain medications give her nightmares.     Plaquenil [hydroxychloroquine Sulfate] Other (See Comments)   Reaction:  GI upset    Reglan [metoclopramide] Other (See Comments)   Reaction:  GI upset    Requip [ropinirole Hcl] Other (See Comments)   Reaction:  GI upset    Septra [sulfamethoxazole-trimethoprim] Nausea And Vomiting   Shellfish Allergy Nausea And Vomiting      Medication List       Accurate as of December 18, 2018 11:59 PM. Always use your most recent med list.        acetaminophen 325 MG tablet Commonly known as:  TYLENOL Take 325 mg by mouth daily.   aspirin EC 81 MG tablet Take 81 mg by mouth at bedtime.   CALCIUM 1000 + D 1000-800 MG-UNIT Tabs Generic drug:  Calcium Carb-Cholecalciferol Take 1 tablet by mouth daily.   carbidopa-levodopa 25-100 MG tablet Commonly known as:  SINEMET IR Take 2 tablets by mouth 3 (three) times daily.   denosumab 60 MG/ML Soln injection Commonly known as:  PROLIA Inject 60 mg into the skin every 6 (six) months. Administer in upper arm, thigh, or abdomen   DEXILANT 60 MG capsule Generic drug:  dexlansoprazole Take 60 mg by mouth daily.   docusate sodium 100 MG capsule Commonly  known as:  COLACE Take 100 mg by mouth at bedtime.   DULoxetine 20 MG capsule Commonly known as:  CYMBALTA TAKE (1) CAPSULE DAILY.   eucerin cream Apply 1 application topically daily.   ezetimibe 10 MG tablet Commonly known as:  ZETIA TAKE 1 TABLET ONCE DAILY.   FERROUS SULFATE PO Take 22.5 mg by mouth daily.   levothyroxine 50 MCG tablet Commonly known as:  SYNTHROID, LEVOTHROID Take 50 mcg by mouth daily before breakfast.   lovastatin  40 MG tablet Commonly known as:  MEVACOR TAKE 1 TABLET ONCE DAILY WITH A MEAL.   metoprolol tartrate 25 MG tablet Commonly known as:  LOPRESSOR TAKE 1 TABLET ONCE DAILY.   multivitamin with minerals Tabs tablet Take 1 tablet by mouth daily.   nystatin powder Commonly known as:  MYCOSTATIN/NYSTOP Apply topically 2 (two) times daily as needed.   pramipexole 1 MG tablet Commonly known as:  MIRAPEX Take 1 tablet (1 mg total) by mouth 3 (three) times daily.   SYSTANE 0.4-0.3 % Soln Generic drug:  Polyethyl Glycol-Propyl Glycol Place 1 drop into both eyes daily.   SYSTANE 0.4-0.3 % Gel ophthalmic gel Generic drug:  Polyethyl Glycol-Propyl Glycol Place 1 application into both eyes at bedtime.   traMADol 200 MG 24 hr tablet Commonly known as:  ULTRAM-ER Take 1 tablet (200 mg total) by mouth daily.   traMADol 50 MG tablet Commonly known as:  ULTRAM Take 1 tablet (50 mg total) by mouth 3 (three) times daily as needed.   trimethoprim 100 MG tablet Commonly known as:  TRIMPEX Take 100 mg by mouth daily.   Vitamin D 50 MCG (2000 UT) Caps Take 2,000 Units by mouth daily.       Review of Systems:  Review of Systems  Constitutional: Negative for activity change, appetite change, chills, diaphoresis, fatigue, fever and unexpected weight change.  HENT: Positive for hearing loss. Negative for congestion and voice change.   Respiratory: Positive for cough. Negative for wheezing.        DOE  Cardiovascular: Positive for leg swelling.  Negative for chest pain and palpitations.  Gastrointestinal: Negative for abdominal distention, abdominal pain, constipation, diarrhea, nausea and vomiting.  Genitourinary: Negative for difficulty urinating, dysuria and urgency.       Urinary leakage.   Musculoskeletal: Positive for arthralgias, back pain and gait problem.  Skin: Positive for rash. Negative for color change and pallor.       Left great toe ingrown toenail. Redness under breasts, abd skin folds, groins.   Neurological: Negative for dizziness, facial asymmetry, speech difficulty and weakness.       Memory lapses.   Psychiatric/Behavioral: Negative for agitation, behavioral problems, hallucinations and sleep disturbance. The patient is not nervous/anxious.     Health Maintenance  Topic Date Due  . TETANUS/TDAP  08/31/2025  . INFLUENZA VACCINE  Completed  . DEXA SCAN  Completed  . PNA vac Low Risk Adult  Completed    Physical Exam: Vitals:   12/18/18 1412  BP: 122/78  Pulse: 78  Resp: 18  Temp: (!) 97.4 F (36.3 C)  TempSrc: Oral  SpO2: 96%  Weight: 165 lb 3.2 oz (74.9 kg)  Height: 4\' 10"  (1.473 m)   Body mass index is 34.53 kg/m. Physical Exam Constitutional:      General: She is not in acute distress.    Appearance: Normal appearance. She is not ill-appearing or toxic-appearing.  HENT:     Head: Normocephalic and atraumatic.     Nose: Nose normal.     Mouth/Throat:     Mouth: Mucous membranes are moist.  Eyes:     Extraocular Movements: Extraocular movements intact.     Pupils: Pupils are equal, round, and reactive to light.  Cardiovascular:     Rate and Rhythm: Normal rate and regular rhythm.     Heart sounds: Murmur present.  Pulmonary:     Effort: Pulmonary effort is normal.     Breath sounds: No wheezing, rhonchi or rales.  Abdominal:     General: There is no distension.     Palpations: Abdomen is soft.     Tenderness: There is no abdominal tenderness. There is no guarding or rebound.    Musculoskeletal:     Right lower leg: Edema present.     Left lower leg: Edema present.     Comments: Trace edema BLE. Ambulates with walker,   Skin:    General: Skin is warm and dry.     Coloration: Skin is not pale.     Findings: Rash present. No erythema.     Comments: Right great toe ingrown toenail. Redness abd skin folds and groins.   Neurological:     General: No focal deficit present.     Mental Status: She is alert.     Coordination: Coordination normal.     Gait: Gait abnormal.     Deep Tendon Reflexes: Reflexes normal.     Comments: Oriented to person and place.   Psychiatric:        Mood and Affect: Mood normal.        Behavior: Behavior normal.     Labs reviewed: Basic Metabolic Panel: Recent Labs    01/24/18 1059 02/03/18 1220 04/15/18 0725 07/01/18 0715  NA 139 143  --  141  K 4.6 4.5  --  4.4  CL 105 106  --  108  CO2 20 22  --  25  GLUCOSE 96 95  --  96  BUN 20 20  --  15  CREATININE 0.89 0.77  --  0.99*  CALCIUM 9.7 9.9  --  9.6  TSH  --   --  1.28 1.77   Liver Function Tests: Recent Labs    01/31/18 0821 07/01/18 0715  AST 32 18  ALT 7 9  ALKPHOS 56  --   BILITOT 0.4 0.4  PROT 6.1 6.5  ALBUMIN 4.2  --    No results for input(s): LIPASE, AMYLASE in the last 8760 hours. No results for input(s): AMMONIA in the last 8760 hours. CBC: Recent Labs    02/03/18 1220 07/01/18 0715  WBC 7.6 5.6  HGB 10.6* 9.6*  HCT 31.9* 29.5*  MCV 89 87.3  PLT 330 334   Lipid Panel: Recent Labs    01/31/18 0821 07/01/18 0715  CHOL 155 164  HDL 61 60  LDLCALC 55 77  TRIG 196* 161*  CHOLHDL 2.5 2.7   No results found for: HGBA1C  Procedures since last visit: No results found.  Assessment/Plan Hypertension Controlled blood pressures, continue Metoprolol 25mg  qd.   Chronic diastolic CHF (congestive heart failure) Compensated clinically, f/u Cardiology in Feb 2020  DOE (dyspnea on exertion) The patient desires pulmonology consultation.    Yeast dermatitis Under breasts, in abd skin folds, groins, will apply Nystatin powder bid to affected areas prn.   Ingrowing nail, right great toe Will see podiatrist.     Labs/tests ordered:  Referral to pulmonology  Next appt:  04/09/2019

## 2018-12-18 NOTE — Assessment & Plan Note (Addendum)
Compensated clinically, f/u Cardiology in Feb 2020

## 2018-12-18 NOTE — Assessment & Plan Note (Signed)
Will see podiatrist.

## 2018-12-18 NOTE — Assessment & Plan Note (Signed)
Controlled blood pressures, continue Metoprolol 25mg  qd.

## 2018-12-18 NOTE — Assessment & Plan Note (Signed)
Under breasts, in abd skin folds, groins, will apply Nystatin powder bid to affected areas prn.

## 2018-12-18 NOTE — Assessment & Plan Note (Signed)
The patient desires pulmonology consultation.

## 2018-12-18 NOTE — Patient Instructions (Signed)
Will refer to pulmonology Dr Melvyn Novas if possible. Nystatin powder bid to skin rash/redness under breasts, in abd skin folds, or groins prn.

## 2018-12-19 DIAGNOSIS — R2681 Unsteadiness on feet: Secondary | ICD-10-CM | POA: Diagnosis not present

## 2018-12-19 DIAGNOSIS — M25561 Pain in right knee: Secondary | ICD-10-CM | POA: Diagnosis not present

## 2018-12-19 DIAGNOSIS — M25531 Pain in right wrist: Secondary | ICD-10-CM | POA: Diagnosis not present

## 2018-12-19 DIAGNOSIS — G20 Parkinson's disease: Secondary | ICD-10-CM | POA: Diagnosis not present

## 2018-12-19 DIAGNOSIS — R278 Other lack of coordination: Secondary | ICD-10-CM | POA: Diagnosis not present

## 2018-12-19 DIAGNOSIS — M25562 Pain in left knee: Secondary | ICD-10-CM | POA: Diagnosis not present

## 2018-12-22 DIAGNOSIS — R278 Other lack of coordination: Secondary | ICD-10-CM | POA: Diagnosis not present

## 2018-12-22 DIAGNOSIS — R2681 Unsteadiness on feet: Secondary | ICD-10-CM | POA: Diagnosis not present

## 2018-12-22 DIAGNOSIS — M25562 Pain in left knee: Secondary | ICD-10-CM | POA: Diagnosis not present

## 2018-12-22 DIAGNOSIS — M25561 Pain in right knee: Secondary | ICD-10-CM | POA: Diagnosis not present

## 2018-12-22 DIAGNOSIS — M25531 Pain in right wrist: Secondary | ICD-10-CM | POA: Diagnosis not present

## 2018-12-22 DIAGNOSIS — N200 Calculus of kidney: Secondary | ICD-10-CM | POA: Diagnosis not present

## 2018-12-22 DIAGNOSIS — G20 Parkinson's disease: Secondary | ICD-10-CM | POA: Diagnosis not present

## 2018-12-24 DIAGNOSIS — R278 Other lack of coordination: Secondary | ICD-10-CM | POA: Diagnosis not present

## 2018-12-24 DIAGNOSIS — R2681 Unsteadiness on feet: Secondary | ICD-10-CM | POA: Diagnosis not present

## 2018-12-24 DIAGNOSIS — M25531 Pain in right wrist: Secondary | ICD-10-CM | POA: Diagnosis not present

## 2018-12-24 DIAGNOSIS — G20 Parkinson's disease: Secondary | ICD-10-CM | POA: Diagnosis not present

## 2018-12-24 DIAGNOSIS — M25561 Pain in right knee: Secondary | ICD-10-CM | POA: Diagnosis not present

## 2018-12-24 DIAGNOSIS — M25562 Pain in left knee: Secondary | ICD-10-CM | POA: Diagnosis not present

## 2018-12-25 ENCOUNTER — Encounter: Payer: Self-pay | Admitting: Podiatry

## 2018-12-25 ENCOUNTER — Ambulatory Visit (INDEPENDENT_AMBULATORY_CARE_PROVIDER_SITE_OTHER): Payer: Medicare Other | Admitting: Podiatry

## 2018-12-25 DIAGNOSIS — M79674 Pain in right toe(s): Secondary | ICD-10-CM

## 2018-12-25 DIAGNOSIS — L539 Erythematous condition, unspecified: Secondary | ICD-10-CM

## 2018-12-25 DIAGNOSIS — L600 Ingrowing nail: Secondary | ICD-10-CM

## 2018-12-25 DIAGNOSIS — B351 Tinea unguium: Secondary | ICD-10-CM | POA: Diagnosis not present

## 2018-12-25 DIAGNOSIS — M79675 Pain in left toe(s): Secondary | ICD-10-CM | POA: Diagnosis not present

## 2018-12-25 MED ORDER — MUPIROCIN 2 % EX OINT: 1.0000 "application " | TOPICAL_OINTMENT | Freq: Two times a day (BID) | CUTANEOUS | 2 refills | Status: DC

## 2018-12-25 NOTE — Patient Instructions (Signed)

## 2018-12-25 NOTE — Progress Notes (Signed)
I reviewed note and agree with plan.   [name], MD

## 2018-12-26 DIAGNOSIS — R2681 Unsteadiness on feet: Secondary | ICD-10-CM | POA: Diagnosis not present

## 2018-12-26 DIAGNOSIS — M25562 Pain in left knee: Secondary | ICD-10-CM | POA: Diagnosis not present

## 2018-12-26 DIAGNOSIS — G20 Parkinson's disease: Secondary | ICD-10-CM | POA: Diagnosis not present

## 2018-12-26 DIAGNOSIS — M25531 Pain in right wrist: Secondary | ICD-10-CM | POA: Diagnosis not present

## 2018-12-26 DIAGNOSIS — R278 Other lack of coordination: Secondary | ICD-10-CM | POA: Diagnosis not present

## 2018-12-26 DIAGNOSIS — M25561 Pain in right knee: Secondary | ICD-10-CM | POA: Diagnosis not present

## 2018-12-28 NOTE — Progress Notes (Signed)
Subjective: [name] presents the office today with her husband for concerns of redness at the tip of her right big toe.  She states that the nails causing pressure to the tip of the toe and causing pain in the redness.  Denies any drainage or pus.  She is asked that the nail is removed but she only wants to remove the nail with absolutely necessary.  She also is asking for her nails be trimmed today as they are thick and elongated causing pain with pressure in shoes. Denies any systemic complaints such as fevers, chills, nausea, vomiting. No acute changes since last appointment, and no other complaints at this time.   Objective: AAO x3, NAD DP/PT pulses palpable bilaterally, CRT less than 3 seconds Overall the nails are hypertrophic, dystrophic with yellow-brown discoloration.  There is no nail present on the left hallux.  On the right the nail is growing into the skin at the distal aspect the nail and there is tenderness to palpation of this area but there is no ascending cellulitis and there is no drainage or pus.  No open wounds. No open lesions or pre-ulcerative lesions.  No pain with calf compression, swelling, warmth, erythema  Assessment: Symptomatic onychomycosis, ingrown toenail right hallux  Plan: -All treatment options discussed with the patient including all alternatives, risks, complications.  -Today I sharply debrided the nails x9 without any complications or bleeding.  No toenail present on the left hallux.  On the right hallux toenail recommend Epson salt soaks daily as well as antibiotic ointment on the toenail.  If symptoms continue we can always remove the toenail however we will hold off on this and she would like to hold off on this if able. -Patient encouraged to call the office with any questions, concerns, change in symptoms.   Trula Slade DPM

## 2018-12-29 DIAGNOSIS — M25531 Pain in right wrist: Secondary | ICD-10-CM | POA: Diagnosis not present

## 2018-12-29 DIAGNOSIS — G20 Parkinson's disease: Secondary | ICD-10-CM | POA: Diagnosis not present

## 2018-12-29 DIAGNOSIS — R2681 Unsteadiness on feet: Secondary | ICD-10-CM | POA: Diagnosis not present

## 2018-12-29 DIAGNOSIS — M25562 Pain in left knee: Secondary | ICD-10-CM | POA: Diagnosis not present

## 2018-12-29 DIAGNOSIS — R278 Other lack of coordination: Secondary | ICD-10-CM | POA: Diagnosis not present

## 2018-12-29 DIAGNOSIS — M25561 Pain in right knee: Secondary | ICD-10-CM | POA: Diagnosis not present

## 2019-01-02 ENCOUNTER — Emergency Department (HOSPITAL_COMMUNITY)
Admission: EM | Admit: 2019-01-02 | Discharge: 2019-01-02 | Disposition: A | Discharge status: Home / Self Care | Payer: Medicare Other | Attending: Emergency Medicine | Admitting: Emergency Medicine

## 2019-01-02 ENCOUNTER — Emergency Department (HOSPITAL_COMMUNITY): Payer: Medicare Other

## 2019-01-02 ENCOUNTER — Encounter (HOSPITAL_COMMUNITY): Payer: Self-pay | Admitting: Emergency Medicine

## 2019-01-02 DIAGNOSIS — R109 Unspecified abdominal pain: Secondary | ICD-10-CM

## 2019-01-02 DIAGNOSIS — I110 Hypertensive heart disease with heart failure: Secondary | ICD-10-CM | POA: Diagnosis not present

## 2019-01-02 DIAGNOSIS — N200 Calculus of kidney: Secondary | ICD-10-CM | POA: Insufficient documentation

## 2019-01-02 DIAGNOSIS — Z7982 Long term (current) use of aspirin: Secondary | ICD-10-CM | POA: Insufficient documentation

## 2019-01-02 DIAGNOSIS — E039 Hypothyroidism, unspecified: Secondary | ICD-10-CM | POA: Insufficient documentation

## 2019-01-02 DIAGNOSIS — I5032 Chronic diastolic (congestive) heart failure: Secondary | ICD-10-CM | POA: Diagnosis not present

## 2019-01-02 DIAGNOSIS — Z79899 Other long term (current) drug therapy: Secondary | ICD-10-CM | POA: Diagnosis not present

## 2019-01-02 DIAGNOSIS — G20 Parkinson's disease: Secondary | ICD-10-CM | POA: Insufficient documentation

## 2019-01-02 DIAGNOSIS — N132 Hydronephrosis with renal and ureteral calculous obstruction: Secondary | ICD-10-CM | POA: Diagnosis not present

## 2019-01-02 DIAGNOSIS — R1084 Generalized abdominal pain: Secondary | ICD-10-CM | POA: Diagnosis present

## 2019-01-02 LAB — BASIC METABOLIC PANEL
Anion gap: 9 (ref 5–15)
BUN: 14 mg/dL (ref 8–23)
CHLORIDE: 108 mmol/L (ref 98–111)
CO2: 21 mmol/L — ABNORMAL LOW (ref 22–32)
Calcium: 9.5 mg/dL (ref 8.9–10.3)
Creatinine, Ser: 1.34 mg/dL — ABNORMAL HIGH (ref 0.44–1.00)
GFR calc Af Amer: 42 mL/min — ABNORMAL LOW (ref >60)
GFR calc non Af Amer: 36 mL/min — ABNORMAL LOW (ref >60)
Glucose, Bld: 118 mg/dL — ABNORMAL HIGH (ref 70–99)
Potassium: 4.7 mmol/L (ref 3.5–5.1)
Sodium: 138 mmol/L (ref 135–145)

## 2019-01-02 LAB — CBC
HCT: 31.4 % — ABNORMAL LOW (ref 36.0–46.0)
Hemoglobin: 9.6 g/dL — ABNORMAL LOW (ref 12.0–15.0)
MCH: 28.2 pg (ref 26.0–34.0)
MCHC: 30.6 g/dL (ref 30.0–36.0)
MCV: 92.1 fL (ref 80.0–100.0)
Platelets: 288 10*3/uL (ref 150–400)
RBC: 3.41 MIL/uL — ABNORMAL LOW (ref 3.87–5.11)
RDW: 13.9 % (ref 11.5–15.5)
WBC: 10.8 10*3/uL — ABNORMAL HIGH (ref 4.0–10.5)
nRBC: 0 % (ref 0.0–0.2)

## 2019-01-02 LAB — URINALYSIS, ROUTINE W REFLEX MICROSCOPIC
Bilirubin Urine: NEGATIVE
GLUCOSE, UA: NEGATIVE mg/dL
Hgb urine dipstick: NEGATIVE
Ketones, ur: NEGATIVE mg/dL
Leukocytes, UA: NEGATIVE
Nitrite: NEGATIVE
Protein, ur: NEGATIVE mg/dL
Specific Gravity, Urine: 1.019 (ref 1.005–1.030)
pH: 7 (ref 5.0–8.0)

## 2019-01-02 LAB — HEPATIC FUNCTION PANEL
ALT: 13 U/L (ref 0–44)
AST: 24 U/L (ref 15–41)
Albumin: 3.5 g/dL (ref 3.5–5.0)
Alkaline Phosphatase: 56 U/L (ref 38–126)
Bilirubin, Direct: <0.1 mg/dL (ref 0.0–0.2)
Total Bilirubin: 0.8 mg/dL (ref 0.3–1.2)
Total Protein: 6.4 g/dL — ABNORMAL LOW (ref 6.5–8.1)

## 2019-01-02 LAB — LIPASE, BLOOD: Lipase: 26 U/L (ref 11–51)

## 2019-01-02 MED ORDER — TRAMADOL HCL 50 MG PO TABS: 50.0000 mg | ORAL_TABLET | Freq: Four times a day (QID) | ORAL | 0 refills | Status: DC | PRN

## 2019-01-02 MED ORDER — ONDANSETRON 4 MG PO TBDP
4.0000 mg | ORAL_TABLET | Freq: Once | ORAL | Status: AC
  Administered 2019-01-02: 4 mg via ORAL
  Filled 2019-01-02: qty 1

## 2019-01-02 MED ORDER — TRAMADOL HCL 50 MG PO TABS
50.0000 mg | ORAL_TABLET | Freq: Once | ORAL | Status: AC
  Administered 2019-01-02: 50 mg via ORAL
  Filled 2019-01-02: qty 1

## 2019-01-02 MED ORDER — ONDANSETRON 4 MG PO TBDP: 4.0000 mg | ORAL_TABLET | Freq: Three times a day (TID) | ORAL | 0 refills | Status: DC | PRN

## 2019-01-02 NOTE — Consult Note (Signed)
Urology Consult Note   Requesting Attending Physician:  Tegeler, Gwenyth Allegra, * Service Providing Consult: Urology  Consulting Attending: Bjorn Loser, MD  Reason for Consult: nephrolithiasis  HPI: [name] is seen in consultation for reasons noted above at the request of Tegeler, Gwenyth Allegra, *.  This is a 85 y.o. female with medical history as detailed below who presents with two days of left flank pain. CT scan in the ED revealed evidence of three left sided distal ureteral/UVJ stones measuring 6-61mm in size. Urology consulted for assistance with management.  Patient denies fevers at home. No nausea/vomiting. No dysuria or symptoms of UTI. Voiding without difficulty. Pain now well controlled after receiving pain medication in the ED.  Past Medical History: Past Medical History:  Diagnosis Date  . Anemia   . Anxiety   . Aortic stenosis    moderate by echo 2019  . Arthritis   . Broken arm    right   . Chronic diastolic CHF (congestive heart failure) ([place])   . Family history of adverse reaction to anesthesia    pt. states sister vomits  . Fibromyalgia   . Fracture of thumb 09/01/2015   right  . Fracture, foot 09/01/2015   right  . GERD (gastroesophageal reflux disease)   . H/O hiatal hernia   . History of bronchitis   . History of kidney stones   . Hypercholesterolemia   . Hypertension    dr t turner  . Hypothyroidism   . Neuropathy   . Osteoporosis   . Parkinson disease ([place])   . PVD (peripheral vascular disease) ([facility])    99% stenosis of left anteiror tibial artery, mod stenosis of left distal SFA and popliteal artery followed by [name]  . RBBB    noted on EKG 2018  . Restless leg   . Shortness of breath   . Sjogren's disease ([place])   . SOB (shortness of breath)    chronic due to diastolic dysfunction, deconditioning, obesity  . Tremor     Past Surgical History:  Past Surgical History:  Procedure Laterality Date  . ABDOMINAL HYSTERECTOMY     . BACK SURGERY     4 back surgeries,   lumbar fusion  . CARDIAC CATHETERIZATION  2006   normal  . EYE SURGERY Bilateral    cateracts  . falls     variious fall, broken wrist,and toes  . FRACTURE SURGERY Right April 2016   Wrist, Pt. fell  . RIGHT/LEFT HEART CATH AND CORONARY ANGIOGRAPHY N/A 02/05/2018   Procedure: RIGHT/LEFT HEART CATH AND CORONARY ANGIOGRAPHY;  Surgeon: Troy Sine, MD;  Location: [facility];  Service: Cardiovascular;  Laterality: N/A;  . SPINAL CORD STIMULATOR INSERTION N/A 09/09/2015   Procedure: LUMBAR SPINAL CORD STIMULATOR INSERTION;  Surgeon: Clydell Hakim, MD;  Location: [facility];  Service: Neurosurgery;  Laterality: N/A;  LUMBAR SPINAL CORD STIMULATOR INSERTION  . SPINE SURGERY  April 2013   Back X's 4  . TOTAL HIP ARTHROPLASTY     right  . WRIST FRACTURE SURGERY Bilateral     Medication: No current facility-administered medications for this encounter.    Current Outpatient Medications  Medication Sig Dispense Refill  . acetaminophen (TYLENOL) 325 MG tablet Take 325 mg by mouth daily.    Marland Kitchen aspirin EC 81 MG tablet Take 81 mg by mouth at bedtime.     . Calcium Carb-Cholecalciferol (CALCIUM 1000 + D) 1000-800 MG-UNIT TABS Take 1 tablet by mouth daily.    Marland Kitchen  carbidopa-levodopa (SINEMET IR) 25-100 MG tablet Take 2 tablets by mouth 3 (three) times daily. 540 tablet 3  . Cholecalciferol (VITAMIN D) 2000 units CAPS Take 2,000 Units by mouth daily.    Marland Kitchen denosumab (PROLIA) 60 MG/ML SOLN injection Inject 60 mg into the skin every 6 (six) months. Administer in upper arm, thigh, or abdomen    . dexlansoprazole (DEXILANT) 60 MG capsule Take 60 mg by mouth daily.    Marland Kitchen docusate sodium (COLACE) 100 MG capsule Take 100 mg by mouth at bedtime.    . DULoxetine (CYMBALTA) 20 MG capsule TAKE (1) CAPSULE DAILY. 90 capsule 0  . ezetimibe (ZETIA) 10 MG tablet TAKE 1 TABLET ONCE DAILY. 90 tablet 0  . FERROUS SULFATE PO Take 22.5 mg by mouth daily.    Marland Kitchen levothyroxine  (SYNTHROID, LEVOTHROID) 50 MCG tablet Take 50 mcg by mouth daily before breakfast.     . lovastatin (MEVACOR) 40 MG tablet TAKE 1 TABLET ONCE DAILY WITH A MEAL. 90 tablet 0  . metoprolol tartrate (LOPRESSOR) 25 MG tablet TAKE 1 TABLET ONCE DAILY. 90 tablet 0  . Multiple Vitamin (MULTIVITAMIN WITH MINERALS) TABS tablet Take 1 tablet by mouth daily.    . mupirocin ointment (BACTROBAN) 2 % Apply 1 application topically 2 (two) times daily. 30 g 2  . nystatin (MYCOSTATIN/NYSTOP) powder Apply topically 2 (two) times daily as needed. 15 g 5  . Polyethyl Glycol-Propyl Glycol (SYSTANE) 0.4-0.3 % GEL ophthalmic gel Place 1 application into both eyes at bedtime.    Vladimir Faster Glycol-Propyl Glycol (SYSTANE) 0.4-0.3 % SOLN Place 1 drop into both eyes daily.     . pramipexole (MIRAPEX) 1 MG tablet Take 1 tablet (1 mg total) by mouth 3 (three) times daily. 270 tablet 3  . Skin Protectants, Misc. (EUCERIN) cream Apply 1 application topically daily.     . traMADol (ULTRAM) 50 MG tablet Take 1 tablet (50 mg total) by mouth 3 (three) times daily as needed. 30 tablet 0  . traMADol (ULTRAM-ER) 200 MG 24 hr tablet Take 1 tablet (200 mg total) by mouth daily. 30 tablet 0  . trimethoprim (TRIMPEX) 100 MG tablet Take 100 mg by mouth daily.      Allergies: Allergies  Allergen Reactions  . Lactose Other (See Comments)    abd pain, lactose intolerant  . Morphine Other (See Comments)  . Sulfa Antibiotics Other (See Comments)    Headache, very sick  . Codeine Other (See Comments)    Reaction:  Headaches and nightmares   . Lactose Intolerance (Gi) Nausea And Vomiting  . Latex Rash  . Lyrica [Pregabalin] Swelling and Other (See Comments)    Reaction:  Leg swelling  . Other Other (See Comments)    Pt states that pain medications give her nightmares.    . Plaquenil [Hydroxychloroquine Sulfate] Other (See Comments)    Reaction:  GI upset   . Reglan [Metoclopramide] Other (See Comments)    Reaction:  GI upset   .  Requip [Ropinirole Hcl] Other (See Comments)    Reaction:  GI upset   . Septra [Sulfamethoxazole-Trimethoprim] Nausea And Vomiting  . Shellfish Allergy Nausea And Vomiting    Social History: Social History   Tobacco Use  . Smoking status: Never Smoker  . Smokeless tobacco: Never Used  Substance Use Topics  . Alcohol use: No    Alcohol/week: 0.0 standard drinks  . Drug use: No    Family History Family History  Problem Relation Age of Onset  .  Heart attack Mother   . Restless legs syndrome Mother   . Heart failure Mother   . Heart disease Mother   . Hypertension Mother   . COPD Father     Review of Systems 10 systems were reviewed and are negative except as noted specifically in the HPI.  Objective   Vital signs in last 24 hours: BP (!) 105/40   Pulse 72   Temp (!) 97.5 F (36.4 C) (Oral)   Resp 18   SpO2 100%   Physical Exam General: NAD, A&O, resting, appropriate HEENT: [place]/AT, EOMI, MMM Pulmonary: Normal work of breathing Cardiovascular: HDS, adequate peripheral perfusion Abdomen: Soft, NTTP, nondistended GU: No CVAT Extremities: warm and well perfused Neuro: Appropriate, no focal neurological deficits  Most Recent Labs: Lab Results  Component Value Date   WBC 10.8 (H) 01/02/2019   HGB 9.6 (L) 01/02/2019   HCT 31.4 (L) 01/02/2019   PLT 288 01/02/2019    Lab Results  Component Value Date   NA 138 01/02/2019   K 4.7 01/02/2019   CL 108 01/02/2019   CO2 21 (L) 01/02/2019   BUN 14 01/02/2019   CREATININE 1.34 (H) 01/02/2019   CALCIUM 9.5 01/02/2019    Lab Results  Component Value Date   INR 1.0 02/03/2018   APTT 30 03/14/2012     IMAGING: Ct Renal Stone Study  Result Date: 01/02/2019 CLINICAL DATA:  History of nephrolithiasis, acute left flank and lower abdominal pain, dysuria EXAM: CT ABDOMEN AND PELVIS WITHOUT CONTRAST TECHNIQUE: Multidetector CT imaging of the abdomen and pelvis was performed following the standard protocol without IV  contrast. COMPARISON:  09/29/2018 FINDINGS: Lower chest: Normal heart size. No pericardial effusion. Small hiatal hernia. Thoracic stimulator noted. Aortic atherosclerosis present. Hepatobiliary: No focal liver abnormality is seen. No gallstones, gallbladder wall thickening, or biliary dilatation. Pancreas: Unremarkable. No pancreatic ductal dilatation or surrounding inflammatory changes. Spleen: Normal in size without focal abnormality. Adrenals/Urinary Tract: Normal adrenal glands. Left kidney demonstrates acute perinephric edema, hydronephrosis, and associated left hydroureter. This is secondary to obstructing left distal ureteral and UVJ calculi in the pelvis. Specifically, there appear to be 2 partially obstructing left distal ureteral stones measuring up to 6 mm adjacent to the iliac vessels, image 67. More inferiorly at the left UVJ, there is an obstructing 9 mm calculus, image 73. There are additional punctate nonobstructing renal calculi bilaterally. Right kidney demonstrates no acute process or obstructing urinary tract calculus. Stomach/Bowel: Negative for bowel obstruction, significant dilatation, ileus, or free air. Normal appendix demonstrated. Moderate colonic stool burden throughout. Minor diverticulosis. No acute inflammatory process Vascular/Lymphatic: Atherosclerosis of the aorta. Negative for aneurysm. No adenoma. Reproductive: Status post hysterectomy. No adnexal masses. Other: No abdominal wall hernia or abnormality. No abdominopelvic ascites. Musculoskeletal: Previous right hip arthroplasty. Degenerative changes and prior fusion of the lumbar spine. Mild associated scoliosis. No acute osseous finding. IMPRESSION: Left distal ureteral and left distal UVJ obstructing right ureteral calculi (approximately 3) ranging in size from 6-9 mm resulting in moderately severe left hydroureteronephrosis. Additional punctate nonobstructing nephrolithiasis bilaterally. Moderate hiatal hernia Abdominal  atherosclerosis without aneurysm Diverticulosis without acute inflammatory process Electronically Signed   By: Jerilynn Mages.  Shick M.D.   On: 01/02/2019 16:30    ------  Assessment:   85 y.o. female with multiple medical comorbidities presenting with multiple obstructing left distal ureteral/UVJ stones measuring 6-59mm.   Patient afebrile and HDS. Non-toxic appearing. WBC 10.8. UA with no evidence of infection. No dysuria. UCx pending. Cr 1.34. Pain significantly  improved with PO medication (tramadol) in the ED.   Discussed options with patient that include left ureteral stent placement with delayed stone treatment versus discharge with close outpatient follow up and planning for definitive stone management with strict return precautions as described below. We discussed the risks and benefits of each option in detail. Given that the patient is asymptomatic, afebrile and HDS without signs of systemic infection, we feel that deferred elective stone treatment is a reasonable option. The patient and her family are in agreement with this plan.   Recommendations: - OK to discharge from urologic perspective. - Recommend sending home with PO pain and nausea medication. - Return precautions include fevers/chills, intractable nausea/vomiting, or pain limiting self care. - Follow up urine culture results. - Follow up requested with urology next week to discuss options for definitive stone management.

## 2019-01-02 NOTE — Discharge Instructions (Signed)
Your history and exam today were concerning for kidney stone and the CT imaging confirmed kidney stones on the left side causing her symptoms.  We did not see evidence of infection or significant kidney injury.  The urology team felt you were safe for discharge home for outpatient follow-up with them.  If they do not call you on Monday, please call to schedule appointment next week for further management.  Please use the pain and nausea medicine to help with your symptoms if returns.  If any symptoms change or worsen or uncontrolled, please return to the nearest emergency department immediately.

## 2019-01-02 NOTE — ED Notes (Signed)
Patient verbalizes understanding of discharge instructions. Opportunity for questioning and answers were provided. Armband removed by staff, pt discharged from ED.  

## 2019-01-02 NOTE — ED Triage Notes (Signed)
Pt to ER for evaluation of left flank pain radiating to lower abdomen over the last few days. Hx of kidney stones. Reports intermittent difficulty with urination.

## 2019-01-02 NOTE — ED Provider Notes (Signed)
[facility] EMERGENCY DEPARTMENT Provider Note   CSN: [id number] Arrival date & time: 01/02/19  1249     History   Chief Complaint Chief Complaint  Patient presents with  . Nephrolithiasis    HPI LAVAEH Padilla is a 85 y.o. female.  The history is provided by the patient, the spouse, a relative and medical records.  Flank Pain  This is a recurrent problem. The current episode started 2 days ago. The problem occurs constantly. The problem has been resolved (with ED pain meds). Pertinent negatives include no chest pain, no abdominal pain, no headaches and no shortness of breath. Nothing aggravates the symptoms. Nothing relieves the symptoms. She has tried nothing for the symptoms. The treatment provided no relief.    Past Medical History:  Diagnosis Date  . Anemia   . Anxiety   . Aortic stenosis    moderate by echo 2019  . Arthritis   . Broken arm    right   . Chronic diastolic CHF (congestive heart failure) ([place])   . Family history of adverse reaction to anesthesia    pt. states sister vomits  . Fibromyalgia   . Fracture of thumb 09/01/2015   right  . Fracture, foot 09/01/2015   right  . GERD (gastroesophageal reflux disease)   . H/O hiatal hernia   . History of bronchitis   . History of kidney stones   . Hypercholesterolemia   . Hypertension    dr t turner  . Hypothyroidism   . Neuropathy   . Osteoporosis   . Parkinson disease ([place])   . PVD (peripheral vascular disease) ([facility])    99% stenosis of left anteiror tibial artery, mod stenosis of left distal SFA and popliteal artery followed by [name]  . RBBB    noted on EKG 2018  . Restless leg   . Shortness of breath   . Sjogren's disease ([place])   . SOB (shortness of breath)    chronic due to diastolic dysfunction, deconditioning, obesity  . Tremor     Patient Active Problem List   Diagnosis Date Noted  . DOE (dyspnea on exertion) 12/18/2018  . Yeast dermatitis 12/18/2018  .  Ingrowing nail, right great toe 12/18/2018  . Iron deficiency anemia 10/09/2018  . Renal lithiasis 09/19/2018  . Urinary frequency 09/18/2018  . Corns and callus 09/18/2018  . Hallucinations, visual 07/21/2018  . History of recent fall 07/21/2018  . Acute right-sided low back pain 05/22/2018  . Gait abnormality 04/21/2018  . Memory loss 04/21/2018  . Abnormal nuclear stress test   . Hypothyroidism 02/04/2018  . Chronic back pain 02/04/2018  . Osteoporosis without current pathological fracture 02/04/2018  . Recurrent UTI 02/04/2018  . Sjogren's syndrome ([place]) 02/04/2018  . Chronic constipation 02/04/2018  . RBBB   . Aortic stenosis   . Other dysphagia 09/03/2014  . Unspecified hereditary and idiopathic peripheral neuropathy 09/03/2014  . Heart murmur 04/19/2014  . GERD (gastroesophageal reflux disease)   . Hypercholesterolemia   . PVD (peripheral vascular disease) ([place])   . Barrett esophagus   . Chronic diastolic CHF (congestive heart failure) ([place])   . Parkinson's disease ([place]) 08/26/2013  . Swelling of both ankles 11/20/2012  . Fracture of multiple pubic rami ([place]) 07/17/2012  . Hematoma of hip 07/17/2012  . Leukocytosis 07/17/2012  . Hypertension   . Atherosclerosis of native arteries of the extremities with intermittent claudication 05/22/2012    Past Surgical History:  Procedure Laterality Date  .  ABDOMINAL HYSTERECTOMY    . BACK SURGERY     4 back surgeries,   lumbar fusion  . CARDIAC CATHETERIZATION  2006   normal  . EYE SURGERY Bilateral    cateracts  . falls     variious fall, broken wrist,and toes  . FRACTURE SURGERY Right April 2016   Wrist, Pt. fell  . RIGHT/LEFT HEART CATH AND CORONARY ANGIOGRAPHY N/A 02/05/2018   Procedure: RIGHT/LEFT HEART CATH AND CORONARY ANGIOGRAPHY;  Surgeon: Troy Sine, MD;  Location: [facility];  Service: Cardiovascular;  Laterality: N/A;  . SPINAL CORD STIMULATOR INSERTION N/A 09/09/2015   Procedure: LUMBAR SPINAL  CORD STIMULATOR INSERTION;  Surgeon: Clydell Hakim, MD;  Location: [facility];  Service: Neurosurgery;  Laterality: N/A;  LUMBAR SPINAL CORD STIMULATOR INSERTION  . SPINE SURGERY  April 2013   Back X's 4  . TOTAL HIP ARTHROPLASTY     right  . WRIST FRACTURE SURGERY Bilateral      OB History   No obstetric history on file.      Home Medications    Prior to Admission medications   Medication Sig Start Date End Date Taking? Authorizing Provider  acetaminophen (TYLENOL) 325 MG tablet Take 325 mg by mouth daily.    [provider]  aspirin EC 81 MG tablet Take 81 mg by mouth at bedtime.     [provider]  Calcium Carb-Cholecalciferol (CALCIUM 1000 + D) 1000-800 MG-UNIT TABS Take 1 tablet by mouth daily.    [provider]  carbidopa-levodopa (SINEMET IR) 25-100 MG tablet Take 2 tablets by mouth 3 (three) times daily. 10/29/18   Dennie Bible, NP  Cholecalciferol (VITAMIN D) 2000 units CAPS Take 2,000 Units by mouth daily.    [provider]  denosumab (PROLIA) 60 MG/ML SOLN injection Inject 60 mg into the skin every 6 (six) months. Administer in upper arm, thigh, or abdomen    [provider]  dexlansoprazole (DEXILANT) 60 MG capsule Take 60 mg by mouth daily.    [provider]  docusate sodium (COLACE) 100 MG capsule Take 100 mg by mouth at bedtime.    [provider]  DULoxetine (CYMBALTA) 20 MG capsule TAKE (1) CAPSULE DAILY. 12/18/18   [name], Man X, NP  ezetimibe (ZETIA) 10 MG tablet TAKE 1 TABLET ONCE DAILY. 12/18/18   [name], Man X, NP  FERROUS SULFATE PO Take 22.5 mg by mouth daily.    [provider]  levothyroxine (SYNTHROID, LEVOTHROID) 50 MCG tablet Take 50 mcg by mouth daily before breakfast.     [provider]  lovastatin (MEVACOR) 40 MG tablet TAKE 1 TABLET ONCE DAILY WITH A MEAL. 12/18/18   [name], Man X, NP  metoprolol tartrate (LOPRESSOR) 25 MG tablet TAKE 1 TABLET ONCE DAILY. 10/27/18    [name], MD  Multiple Vitamin (MULTIVITAMIN WITH MINERALS) TABS tablet Take 1 tablet by mouth daily.    [provider]  mupirocin ointment (BACTROBAN) 2 % Apply 1 application topically 2 (two) times daily. 12/25/18   Trula Slade, DPM  nystatin (MYCOSTATIN/NYSTOP) powder Apply topically 2 (two) times daily as needed. 12/18/18   [name], Man X, NP  Polyethyl Glycol-Propyl Glycol (SYSTANE) 0.4-0.3 % GEL ophthalmic gel Place 1 application into both eyes at bedtime.    [provider]  Polyethyl Glycol-Propyl Glycol (SYSTANE) 0.4-0.3 % SOLN Place 1 drop into both eyes daily.     [provider]  pramipexole (MIRAPEX) 1 MG tablet  Take 1 tablet (1 mg total) by mouth 3 (three) times daily. 10/29/18   Dennie Bible, NP  Skin Protectants, Misc. (EUCERIN) cream Apply 1 application topically daily.     [provider]  traMADol (ULTRAM) 50 MG tablet Take 1 tablet (50 mg total) by mouth 3 (three) times daily as needed. 05/22/18   [name], Man X, NP  traMADol (ULTRAM-ER) 200 MG 24 hr tablet Take 1 tablet (200 mg total) by mouth daily. 05/22/18   [name], Man X, NP  trimethoprim (TRIMPEX) 100 MG tablet Take 100 mg by mouth daily.    [provider]    Family History Family History  Problem Relation Age of Onset  . Heart attack Mother   . Restless legs syndrome Mother   . Heart failure Mother   . Heart disease Mother   . Hypertension Mother   . COPD Father     Social History Social History   Tobacco Use  . Smoking status: Never Smoker  . Smokeless tobacco: Never Used  Substance Use Topics  . Alcohol use: No    Alcohol/week: 0.0 standard drinks  . Drug use: No     Allergies   Lactose; Morphine; Sulfa antibiotics; Codeine; Lactose intolerance (gi); Latex; Lyrica [pregabalin]; Other; Plaquenil [hydroxychloroquine sulfate]; Reglan [metoclopramide]; Requip [ropinirole hcl]; Septra [sulfamethoxazole-trimethoprim]; and Shellfish  allergy   Review of Systems Review of Systems  Constitutional: Negative for chills, fatigue and fever.  HENT: Negative for congestion.   Eyes: Negative for visual disturbance.  Respiratory: Negative for cough, chest tightness, shortness of breath and wheezing.   Cardiovascular: Negative for chest pain, palpitations and leg swelling.  Gastrointestinal: Positive for constipation. Negative for abdominal pain, diarrhea, nausea and vomiting.  Genitourinary: Positive for decreased urine volume, difficulty urinating and flank pain.  Musculoskeletal: Positive for back pain. Negative for neck pain and neck stiffness.  Skin: Negative for rash and wound.  Neurological: Negative for light-headedness, numbness and headaches.  Psychiatric/Behavioral: Negative for agitation.  All other systems reviewed and are negative.    Physical Exam Updated Vital Signs BP (!) 144/63   Pulse 72   Temp (!) 97.5 F (36.4 C) (Oral)   Resp 18   SpO2 100%   Physical Exam Vitals signs and nursing note reviewed.  Constitutional:      General: She is not in acute distress.    Appearance: She is well-developed. She is not ill-appearing, toxic-appearing or diaphoretic.  HENT:     Head: Normocephalic and atraumatic.     Nose: No congestion or rhinorrhea.     Mouth/Throat:     Pharynx: No oropharyngeal exudate or posterior oropharyngeal erythema.  Eyes:     Conjunctiva/sclera: Conjunctivae normal.  Neck:     Musculoskeletal: Neck supple. No muscular tenderness.  Cardiovascular:     Rate and Rhythm: Normal rate and regular rhythm.     Heart sounds: Murmur present.  Pulmonary:     Effort: Pulmonary effort is normal. No respiratory distress.     Breath sounds: Normal breath sounds. No wheezing, rhonchi or rales.  Chest:     Chest wall: No tenderness.  Abdominal:     General: Abdomen is flat. There is no distension.     Palpations: Abdomen is soft. There is no mass.     Tenderness: There is no abdominal  tenderness. There is right CVA tenderness and left CVA tenderness.  Musculoskeletal:        General: No tenderness.     Right lower leg:  Edema (mild) present.     Left lower leg: Edema (mild) present.  Skin:    General: Skin is warm and dry.     Capillary Refill: Capillary refill takes less than 2 seconds.     Findings: No erythema or rash.  Neurological:     General: No focal deficit present.     Mental Status: She is alert.      ED Treatments / Results  Labs (all labs ordered are listed, but only abnormal results are displayed) Labs Reviewed  CBC - Abnormal; Notable for the following components:      Result Value   WBC 10.8 (*)    RBC 3.41 (*)    Hemoglobin 9.6 (*)    HCT 31.4 (*)    All other components within normal limits  BASIC METABOLIC PANEL - Abnormal; Notable for the following components:   CO2 21 (*)    Glucose, Bld 118 (*)    Creatinine, Ser 1.34 (*)    GFR calc non Af Amer 36 (*)    GFR calc Af Amer 42 (*)    All other components within normal limits  HEPATIC FUNCTION PANEL - Abnormal; Notable for the following components:   Total Protein 6.4 (*)    All other components within normal limits  URINE CULTURE  URINALYSIS, ROUTINE W REFLEX MICROSCOPIC  LIPASE, BLOOD    EKG None  Radiology Ct Renal Stone Study  Result Date: 01/02/2019 CLINICAL DATA:  History of nephrolithiasis, acute left flank and lower abdominal pain, dysuria EXAM: CT ABDOMEN AND PELVIS WITHOUT CONTRAST TECHNIQUE: Multidetector CT imaging of the abdomen and pelvis was performed following the standard protocol without IV contrast. COMPARISON:  09/29/2018 FINDINGS: Lower chest: Normal heart size. No pericardial effusion. Small hiatal hernia. Thoracic stimulator noted. Aortic atherosclerosis present. Hepatobiliary: No focal liver abnormality is seen. No gallstones, gallbladder wall thickening, or biliary dilatation. Pancreas: Unremarkable. No pancreatic ductal dilatation or surrounding  inflammatory changes. Spleen: Normal in size without focal abnormality. Adrenals/Urinary Tract: Normal adrenal glands. Left kidney demonstrates acute perinephric edema, hydronephrosis, and associated left hydroureter. This is secondary to obstructing left distal ureteral and UVJ calculi in the pelvis. Specifically, there appear to be 2 partially obstructing left distal ureteral stones measuring up to 6 mm adjacent to the iliac vessels, image 67. More inferiorly at the left UVJ, there is an obstructing 9 mm calculus, image 73. There are additional punctate nonobstructing renal calculi bilaterally. Right kidney demonstrates no acute process or obstructing urinary tract calculus. Stomach/Bowel: Negative for bowel obstruction, significant dilatation, ileus, or free air. Normal appendix demonstrated. Moderate colonic stool burden throughout. Minor diverticulosis. No acute inflammatory process Vascular/Lymphatic: Atherosclerosis of the aorta. Negative for aneurysm. No adenoma. Reproductive: Status post hysterectomy. No adnexal masses. Other: No abdominal wall hernia or abnormality. No abdominopelvic ascites. Musculoskeletal: Previous right hip arthroplasty. Degenerative changes and prior fusion of the lumbar spine. Mild associated scoliosis. No acute osseous finding. IMPRESSION: Left distal ureteral and left distal UVJ obstructing right ureteral calculi (approximately 3) ranging in size from 6-9 mm resulting in moderately severe left hydroureteronephrosis. Additional punctate nonobstructing nephrolithiasis bilaterally. Moderate hiatal hernia Abdominal atherosclerosis without aneurysm Diverticulosis without acute inflammatory process Electronically Signed   By: Jerilynn Mages.  Shick M.D.   On: 01/02/2019 16:30    Procedures Procedures (including critical care time)  Medications Ordered in ED Medications  traMADol (ULTRAM) tablet 50 mg (50 mg Oral Given 01/02/19 1314)  ondansetron (ZOFRAN-ODT) disintegrating tablet 4 mg (4 mg  Oral Given 01/02/19  1314)  traMADol (ULTRAM) tablet 50 mg (50 mg Oral Given 01/02/19 2131)  ondansetron (ZOFRAN-ODT) disintegrating tablet 4 mg (4 mg Oral Given 01/02/19 2130)     Initial Impression / Assessment and Plan / ED Course  I have reviewed the triage vital signs and the nursing notes.  Pertinent labs & imaging results that were available during my care of the patient were reviewed by me and considered in my medical decision making (see chart for details).     [name] is a 85 y.o. female with a past medical history significant for congestive heart failure, hypertension, Parkinson's disease, GERD, heart murmur, osteoporosis, recurrent urinary tract infections, and kidney stones who presents with bilateral flank pain and difficulty with urination.  Patient reports that her symptoms feel similar to passing kidney stones.  She reports that over the last 2 weeks she has passed numerous stones through her urine.  She reports they come out like gravel.  She reports that she is managed  by urology at [facility].  She says that over the last few days she has been having worsened pain alternating between each flank and then now in both flanks.  She reports the pain is up to a 12 out of 10 in severity upon arrival to the emergency department.  She reports no significant nausea or vomiting.  She denies fevers or chills.  She denies significant congestion, cough, chest pain, shortness of breath.  She reports that her urine has looked more darkened and orange like.  She denies dysuria.  She says that she was told by her urologist to come to the emergency department if the pain was uncontrolled.  She reports that she has had constipation and has not a bowel movement for several days.  She denies a history of bowel obstructions and she still passing gas.  She reports no abdominal distention.  On exam, patient has bilateral CVA tenderness.  Bilateral mild flank tenderness.  Abdomen  otherwise nontender.  Legs nontender with only mild edema which she reports is unchanged.  Lungs are clear and chest is nontender.  Patient has a loud murmur.  Patient resting comfortably with no evidence of trauma.  Clinically I am concerned patient either has obstructing stones or is still having pain from recently passed stones.  She reports she is passed several over the last several days.  Patient will have urinalysis and lab work.  Patient will be stone study to look for obstructing stones.  She will also have the CT to look for large bowel traction.  We will get a noncontrast scan given the greater concern for stone troubles.  Patient reports her pain is significant improved after receiving tramadol and Zofran in triage.  If work-up is reassuring, anticipate discharge with outpatient neurology follow-up and prescription for tramadol as this is significantly help.  5:57 PM CT scan confirms kidney stones as likely cause of flank pains.  Patient has 3 stacked kidney stones in her left distal ureter/UVJ ranging between 6 and 9 mm that is causing moderate to severe hydronephrosis on the left.  Stones are present on both kidneys.  Spoke with family who are very concerned about the patient symptoms.  Lab review shows that creatinine has risen since last evaluation.  Urology will be called for recommendations given these multiple large stones together.  6:12 PM Spoke with urology who will contact the patient in about management options.  Urology came to the patient and patient will follow-up in  clinic this week for outpatient management.  Patient given prescription for pain medicine nausea medicine as well as strict return precautions.  Patient and urology team agreed with plan and patient was discharged in good condition after a dose of pain and nausea medicine.  Patient understood questions or concerns and was discharged in good condition.   Final Clinical Impressions(s) / ED Diagnoses    Final diagnoses:  Flank pain  Kidney stone    ED Discharge Orders         Ordered    ondansetron (ZOFRAN ODT) 4 MG disintegrating tablet  Every 8 hours PRN     01/02/19 2118    traMADol (ULTRAM) 50 MG tablet  Every 6 hours PRN     01/02/19 2118         Clinical Impression: 1. Flank pain   2. Kidney stone     Disposition: Discharge  Condition: Good  I have discussed the results, Dx and Tx plan with the pt(& family if present). He/she/they expressed understanding and agree(s) with the plan. Discharge instructions discussed at great length. Strict return precautions discussed and pt &/or family have verbalized understanding of the instructions. No further questions at time of discharge.    Discharge Medication List as of 01/02/2019  9:18 PM    START taking these medications   Details  ondansetron (ZOFRAN ODT) 4 MG disintegrating tablet Take 1 tablet (4 mg total) by mouth every 8 (eight) hours as needed for nausea or vomiting., Starting Fri 01/02/2019, Print    !! traMADol (ULTRAM) 50 MG tablet Take 1 tablet (50 mg total) by mouth every 6 (six) hours as needed., Starting Fri 01/02/2019, Print     !! - Potential duplicate medications found. Please discuss with provider.      Follow Up: [place] Schedule an appointment as soon as possible for a visit    [facility] [address] [phone] mc [place]       Tegeler, Gwenyth Allegra, MD 01/02/19 [phone]

## 2019-01-04 LAB — URINE CULTURE: Culture: NO GROWTH

## 2019-01-05 ENCOUNTER — Other Ambulatory Visit: Payer: Self-pay | Admitting: Urology

## 2019-01-05 ENCOUNTER — Encounter (HOSPITAL_COMMUNITY): Payer: Self-pay | Admitting: *Deleted

## 2019-01-05 ENCOUNTER — Other Ambulatory Visit: Payer: Self-pay

## 2019-01-05 DIAGNOSIS — N132 Hydronephrosis with renal and ureteral calculous obstruction: Secondary | ICD-10-CM | POA: Diagnosis not present

## 2019-01-05 DIAGNOSIS — N201 Calculus of ureter: Secondary | ICD-10-CM | POA: Diagnosis not present

## 2019-01-05 DIAGNOSIS — N23 Unspecified renal colic: Secondary | ICD-10-CM | POA: Diagnosis not present

## 2019-01-05 NOTE — Progress Notes (Signed)
Husband asked at time of preop phone call if patient could have few ice chips during nite if needed due to dry mouth from Parkinson's and Sjogren's syndrome.  Spoke with Konrad Felix, PA, anesthesia.  Informed husband that patient could have few ice chips if dry mouth but not in excess.  Husband and patient  Both voiced understanding.

## 2019-01-05 NOTE — Progress Notes (Signed)
ECHO- 12/11/2018- epic -moderate aortic stenosis 01/31/18-Stress TEst- epic  02/05/18- cath- epic 01/02/2019-In ED with flank pain Cbc/cmp and u/a-01/02/19-epic  02/13/18- LOV- Card-epic

## 2019-01-05 NOTE — H&P (Signed)
Urology Preoperative H&P   Chief Complaint: Left flank pain  History of Present Illness: [name] is a 85 y.o. female with long history of kidney stones. She presents with a 3 day history of sharp, intermittent, non-radiating left-sided flank pain associated with nausea and vomiting. She was seen in consultation by Dr. Matilde Sprang on 01/02/19 and was started on MET, but she states that the pain has been unbearable despite tramadol. She denies fevers at home, but does report chills when her pain is severe. She is voiding w/o difficulty and denies dysuria or hematuria.  CT stone study (01/02/2019) showed multiple 6-9 mm left distal ureteral calculi with moderate to severe left-sided hydronephrosis. She also has punctate bilateral renal stones.   Labs 01/02/2019  Serum creatinine 1.34  WBC-10.8  Urinalysis showed no overt signs of acute cystitis   -Family hx of stones- Denies. The patient has passed several stones in the past, but has not required surgery  -GI/Bone disease- hx of severe osteoporosis.  -Obesity/bariatric surgery/intestinal bypass- GERD. No hx of IBD or Crohns. No prior GI surgery  -Hx of Gout- Denies  -Recurrent UTIs- Denies  -Hx of nephrocalcinosis- Denies  -Hx of DMII- Denies  -Topiramate or acetazolimide use- Denies     Past Medical History:  Diagnosis Date  . Anemia   . Anxiety   . Aortic stenosis    moderate by echo 2019  . Arthritis   . Broken arm    right   . Chronic diastolic CHF (congestive heart failure) ([place])   . Family history of adverse reaction to anesthesia    pt. states sister vomits  . Fibromyalgia   . Fracture of thumb 09/01/2015   right  . Fracture, foot 09/01/2015   right  . GERD (gastroesophageal reflux disease)   . H/O hiatal hernia   . History of bronchitis   . History of kidney stones   . Hypercholesterolemia   . Hypertension    dr t turner  . Hypothyroidism   . Neuropathy   . Osteoporosis   . Parkinson disease ([place])   . PVD  (peripheral vascular disease) ([facility])    99% stenosis of left anteiror tibial artery, mod stenosis of left distal SFA and popliteal artery followed by [name]  . RBBB    noted on EKG 2018  . Restless leg   . Shortness of breath    with exertion - chronic   . Sjogren's disease ([place])   . SOB (shortness of breath)    chronic due to diastolic dysfunction, deconditioning, obesity  . Tremor     Past Surgical History:  Procedure Laterality Date  . ABDOMINAL HYSTERECTOMY    . BACK SURGERY     4 back surgeries,   lumbar fusion  . CARDIAC CATHETERIZATION  2006   normal  . EYE SURGERY Bilateral    cateracts  . falls     variious fall, broken wrist,and toes  . FRACTURE SURGERY Right April 2016   Wrist, Pt. fell  . RIGHT/LEFT HEART CATH AND CORONARY ANGIOGRAPHY N/A 02/05/2018   Procedure: RIGHT/LEFT HEART CATH AND CORONARY ANGIOGRAPHY;  Surgeon: Troy Sine, MD;  Location: [facility];  Service: Cardiovascular;  Laterality: N/A;  . SPINAL CORD STIMULATOR INSERTION N/A 09/09/2015   Procedure: LUMBAR SPINAL CORD STIMULATOR INSERTION;  Surgeon: Clydell Hakim, MD;  Location: [facility];  Service: Neurosurgery;  Laterality: N/A;  LUMBAR SPINAL CORD STIMULATOR INSERTION  . SPINE SURGERY  April 2013   Back  X's 4  . TOTAL HIP ARTHROPLASTY     right  . WRIST FRACTURE SURGERY Bilateral     Allergies:  Allergies  Allergen Reactions  . Lactose Other (See Comments)    abd pain, lactose intolerant  . Morphine Other (See Comments)  . Sulfa Antibiotics Other (See Comments)    Headache, very sick  . Codeine Other (See Comments)    Reaction:  Headaches and nightmares   . Lactose Intolerance (Gi) Nausea And Vomiting  . Latex Rash  . Lyrica [Pregabalin] Swelling and Other (See Comments)    Reaction:  Leg swelling  . Other Other (See Comments)    Pt states that pain medications give her nightmares.    . Plaquenil [Hydroxychloroquine Sulfate] Other (See Comments)    Reaction:  GI upset    . Reglan [Metoclopramide] Other (See Comments)    Reaction:  GI upset   . Requip [Ropinirole Hcl] Other (See Comments)    Reaction:  GI upset   . Septra [Sulfamethoxazole-Trimethoprim] Nausea And Vomiting  . Shellfish Allergy Nausea And Vomiting    Family History  Problem Relation Age of Onset  . Heart attack Mother   . Restless legs syndrome Mother   . Heart failure Mother   . Heart disease Mother   . Hypertension Mother   . COPD Father     Social History:  reports that she has never smoked. She has never used smokeless tobacco. She reports that she does not drink alcohol or use drugs.  ROS: A complete review of systems was performed.  All systems are negative except for pertinent findings as noted.  Physical Exam:  Vital signs in last 24 hours:   Constitutional:  Alert and oriented, No acute distress Cardiovascular: Regular rate and rhythm, No JVD Respiratory: Normal respiratory effort, Lungs clear bilaterally GI: Abdomen is soft, nontender, nondistended, no abdominal masses GU: No CVA tenderness Lymphatic: No lymphadenopathy Neurologic: Grossly intact, no focal deficits Psychiatric: Normal mood and affect  Laboratory Data:  No results for input(s): WBC, HGB, HCT, PLT in the last 72 hours.  No results for input(s): NA, K, CL, GLUCOSE, BUN, CALCIUM, CREATININE in the last 72 hours.  Invalid input(s): CO3   No results found for this or any previous visit (from the past 24 hour(s)). Recent Results (from the past 240 hour(s))  Urine culture     Status: None   Collection Time: 01/02/19  1:05 PM  Result Value Ref Range Status   Specimen Description URINE, RANDOM  Final   Special Requests   Final    NONE Performed at [facility], [address], [place], [place] [address]    Culture NO GROWTH  Final   Report Status 01/04/2019 FINAL  Final    Renal Function: Recent Labs    01/02/19 1311  CREATININE 1.34*   CrCl cannot be calculated (Unknown ideal  weight.).  Radiologic Imaging: No results found.  I independently reviewed the above imaging studies.  Assessment and Plan OKTOBER GLAZER is a 85 y.o. female with two obstructing left distal ureteral calculi associated with left renal colic and moderate hydronephrosis   The risks, benefits and alternatives of cystoscopy with LEFT ureteroscopy, laser lithotripsy and ureteral stent placement was discussed the patient.  Risks included, but are not limited to: bleeding, urinary tract infection, ureteral injury/avulsion, ureteral stricture formation, retained stone fragments, the possibility that multiple surgeries may be required to treat the stone(s), MI, stroke, PE and the inherent risks of  general anesthesia.  The patient voices understanding and wishes to proceed.      Ellison Hughs, MD 01/05/2019, 1:20 PM  [facility] Pager: [phone]

## 2019-01-06 ENCOUNTER — Encounter (HOSPITAL_COMMUNITY): Payer: Self-pay | Admitting: Emergency Medicine

## 2019-01-06 ENCOUNTER — Ambulatory Visit (HOSPITAL_COMMUNITY)
Admission: RE | Admit: 2019-01-06 | Discharge: 2019-01-06 | Disposition: A | Discharge status: Home / Self Care | Payer: Medicare Other | Attending: Urology | Admitting: Urology

## 2019-01-06 ENCOUNTER — Ambulatory Visit (HOSPITAL_COMMUNITY): Payer: Medicare Other

## 2019-01-06 ENCOUNTER — Ambulatory Visit: Payer: Medicare Other | Attending: Nurse Practitioner | Admitting: Physical Therapy

## 2019-01-06 ENCOUNTER — Encounter (HOSPITAL_COMMUNITY)
Admission: RE | Disposition: A | Discharge status: Home / Self Care | Payer: Self-pay | Source: Home / Self Care | Attending: Urology

## 2019-01-06 ENCOUNTER — Ambulatory Visit (HOSPITAL_COMMUNITY): Payer: Medicare Other | Admitting: Anesthesiology

## 2019-01-06 DIAGNOSIS — F419 Anxiety disorder, unspecified: Secondary | ICD-10-CM | POA: Diagnosis not present

## 2019-01-06 DIAGNOSIS — E7800 Pure hypercholesterolemia, unspecified: Secondary | ICD-10-CM | POA: Insufficient documentation

## 2019-01-06 DIAGNOSIS — M810 Age-related osteoporosis without current pathological fracture: Secondary | ICD-10-CM | POA: Insufficient documentation

## 2019-01-06 DIAGNOSIS — Z6831 Body mass index (BMI) 31.0-31.9, adult: Secondary | ICD-10-CM | POA: Diagnosis not present

## 2019-01-06 DIAGNOSIS — D649 Anemia, unspecified: Secondary | ICD-10-CM | POA: Diagnosis not present

## 2019-01-06 DIAGNOSIS — N210 Calculus in bladder: Secondary | ICD-10-CM | POA: Diagnosis not present

## 2019-01-06 DIAGNOSIS — I350 Nonrheumatic aortic (valve) stenosis: Secondary | ICD-10-CM | POA: Diagnosis not present

## 2019-01-06 DIAGNOSIS — E739 Lactose intolerance, unspecified: Secondary | ICD-10-CM | POA: Insufficient documentation

## 2019-01-06 DIAGNOSIS — I110 Hypertensive heart disease with heart failure: Secondary | ICD-10-CM | POA: Insufficient documentation

## 2019-01-06 DIAGNOSIS — Z96641 Presence of right artificial hip joint: Secondary | ICD-10-CM | POA: Diagnosis not present

## 2019-01-06 DIAGNOSIS — I5032 Chronic diastolic (congestive) heart failure: Secondary | ICD-10-CM | POA: Diagnosis not present

## 2019-01-06 DIAGNOSIS — I739 Peripheral vascular disease, unspecified: Secondary | ICD-10-CM | POA: Insufficient documentation

## 2019-01-06 DIAGNOSIS — E039 Hypothyroidism, unspecified: Secondary | ICD-10-CM | POA: Insufficient documentation

## 2019-01-06 DIAGNOSIS — N132 Hydronephrosis with renal and ureteral calculous obstruction: Secondary | ICD-10-CM | POA: Diagnosis not present

## 2019-01-06 DIAGNOSIS — K449 Diaphragmatic hernia without obstruction or gangrene: Secondary | ICD-10-CM | POA: Insufficient documentation

## 2019-01-06 DIAGNOSIS — M1990 Unspecified osteoarthritis, unspecified site: Secondary | ICD-10-CM | POA: Insufficient documentation

## 2019-01-06 DIAGNOSIS — M797 Fibromyalgia: Secondary | ICD-10-CM | POA: Diagnosis not present

## 2019-01-06 DIAGNOSIS — Z882 Allergy status to sulfonamides status: Secondary | ICD-10-CM | POA: Diagnosis not present

## 2019-01-06 DIAGNOSIS — G2581 Restless legs syndrome: Secondary | ICD-10-CM | POA: Insufficient documentation

## 2019-01-06 DIAGNOSIS — G20 Parkinson's disease: Secondary | ICD-10-CM | POA: Diagnosis not present

## 2019-01-06 DIAGNOSIS — N201 Calculus of ureter: Secondary | ICD-10-CM | POA: Diagnosis not present

## 2019-01-06 DIAGNOSIS — Z881 Allergy status to other antibiotic agents status: Secondary | ICD-10-CM | POA: Insufficient documentation

## 2019-01-06 DIAGNOSIS — Z825 Family history of asthma and other chronic lower respiratory diseases: Secondary | ICD-10-CM | POA: Insufficient documentation

## 2019-01-06 DIAGNOSIS — E669 Obesity, unspecified: Secondary | ICD-10-CM | POA: Diagnosis not present

## 2019-01-06 DIAGNOSIS — K219 Gastro-esophageal reflux disease without esophagitis: Secondary | ICD-10-CM | POA: Insufficient documentation

## 2019-01-06 DIAGNOSIS — N202 Calculus of kidney with calculus of ureter: Secondary | ICD-10-CM | POA: Diagnosis not present

## 2019-01-06 DIAGNOSIS — Z8249 Family history of ischemic heart disease and other diseases of the circulatory system: Secondary | ICD-10-CM | POA: Insufficient documentation

## 2019-01-06 DIAGNOSIS — Z888 Allergy status to other drugs, medicaments and biological substances status: Secondary | ICD-10-CM | POA: Insufficient documentation

## 2019-01-06 DIAGNOSIS — Z87442 Personal history of urinary calculi: Secondary | ICD-10-CM | POA: Insufficient documentation

## 2019-01-06 DIAGNOSIS — G629 Polyneuropathy, unspecified: Secondary | ICD-10-CM | POA: Insufficient documentation

## 2019-01-06 DIAGNOSIS — M3500 Sicca syndrome, unspecified: Secondary | ICD-10-CM | POA: Insufficient documentation

## 2019-01-06 DIAGNOSIS — Z885 Allergy status to narcotic agent status: Secondary | ICD-10-CM | POA: Insufficient documentation

## 2019-01-06 DIAGNOSIS — I4510 Unspecified right bundle-branch block: Secondary | ICD-10-CM | POA: Insufficient documentation

## 2019-01-06 DIAGNOSIS — Z91040 Latex allergy status: Secondary | ICD-10-CM | POA: Insufficient documentation

## 2019-01-06 DIAGNOSIS — Z91013 Allergy to seafood: Secondary | ICD-10-CM | POA: Insufficient documentation

## 2019-01-06 HISTORY — PX: CYSTOSCOPY/URETEROSCOPY/HOLMIUM LASER/STENT PLACEMENT: SHX6546

## 2019-01-06 SURGERY — CYSTOSCOPY/URETEROSCOPY/HOLMIUM LASER/STENT PLACEMENT
Anesthesia: General | Laterality: Left

## 2019-01-06 MED ORDER — DEXAMETHASONE SODIUM PHOSPHATE 10 MG/ML IJ SOLN
INTRAMUSCULAR | Status: AC
  Filled 2019-01-06: qty 1

## 2019-01-06 MED ORDER — FENTANYL CITRATE (PF) 100 MCG/2ML IJ SOLN
INTRAMUSCULAR | Status: AC
  Filled 2019-01-06: qty 2

## 2019-01-06 MED ORDER — ACETAMINOPHEN 500 MG PO TABS
1000.0000 mg | ORAL_TABLET | Freq: Once | ORAL | Status: AC
  Administered 2019-01-06: 1000 mg via ORAL
  Filled 2019-01-06: qty 2

## 2019-01-06 MED ORDER — ONDANSETRON HCL 4 MG/2ML IJ SOLN
INTRAMUSCULAR | Status: DC | PRN
  Administered 2019-01-06: 4 mg via INTRAVENOUS

## 2019-01-06 MED ORDER — PHENAZOPYRIDINE HCL 200 MG PO TABS: 200.0000 mg | ORAL_TABLET | Freq: Three times a day (TID) | ORAL | 0 refills | Status: DC | PRN

## 2019-01-06 MED ORDER — PROPOFOL 10 MG/ML IV BOLUS
INTRAVENOUS | Status: DC | PRN
  Administered 2019-01-06: 100 mg via INTRAVENOUS

## 2019-01-06 MED ORDER — LACTATED RINGERS IV SOLN
INTRAVENOUS | Status: DC
  Administered 2019-01-06: 09:00:00 via INTRAVENOUS

## 2019-01-06 MED ORDER — FENTANYL CITRATE (PF) 100 MCG/2ML IJ SOLN
INTRAMUSCULAR | Status: DC | PRN
  Administered 2019-01-06: 25 ug via INTRAVENOUS
  Administered 2019-01-06: 50 ug via INTRAVENOUS
  Administered 2019-01-06: 25 ug via INTRAVENOUS

## 2019-01-06 MED ORDER — DEXAMETHASONE SODIUM PHOSPHATE 10 MG/ML IJ SOLN
INTRAMUSCULAR | Status: DC | PRN
  Administered 2019-01-06: 10 mg via INTRAVENOUS

## 2019-01-06 MED ORDER — EPHEDRINE SULFATE-NACL 50-0.9 MG/10ML-% IV SOSY
PREFILLED_SYRINGE | INTRAVENOUS | Status: DC | PRN
  Administered 2019-01-06 (×4): 10 mg via INTRAVENOUS

## 2019-01-06 MED ORDER — CEPHALEXIN 250 MG PO CAPS: 250.0000 mg | ORAL_CAPSULE | Freq: Two times a day (BID) | ORAL | 0 refills | Status: AC

## 2019-01-06 MED ORDER — ONDANSETRON HCL 4 MG/2ML IJ SOLN
INTRAMUSCULAR | Status: AC
  Filled 2019-01-06: qty 2

## 2019-01-06 MED ORDER — LIDOCAINE 2% (20 MG/ML) 5 ML SYRINGE
INTRAMUSCULAR | Status: DC | PRN
  Administered 2019-01-06: 80 mg via INTRAVENOUS

## 2019-01-06 MED ORDER — CEFAZOLIN SODIUM-DEXTROSE 2-4 GM/100ML-% IV SOLN
2.0000 g | Freq: Once | INTRAVENOUS | Status: AC
  Administered 2019-01-06: 2 g via INTRAVENOUS
  Filled 2019-01-06: qty 100

## 2019-01-06 MED ORDER — TRAMADOL HCL 50 MG PO TABS: 50.0000 mg | ORAL_TABLET | Freq: Four times a day (QID) | ORAL | 0 refills | Status: DC | PRN

## 2019-01-06 MED ORDER — LIDOCAINE 2% (20 MG/ML) 5 ML SYRINGE
INTRAMUSCULAR | Status: AC
  Filled 2019-01-06: qty 5

## 2019-01-06 MED ORDER — IOHEXOL 300 MG/ML  SOLN
INTRAMUSCULAR | Status: DC | PRN
  Administered 2019-01-06: 16 mL

## 2019-01-06 MED ORDER — 0.9 % SODIUM CHLORIDE (POUR BTL) OPTIME
TOPICAL | Status: DC | PRN
  Administered 2019-01-06: 500 mL

## 2019-01-06 MED ORDER — EPHEDRINE 5 MG/ML INJ
INTRAVENOUS | Status: AC
  Filled 2019-01-06: qty 20

## 2019-01-06 MED ORDER — SODIUM CHLORIDE 0.9 % IR SOLN
Status: DC | PRN
  Administered 2019-01-06: 3000 mL

## 2019-01-06 MED ORDER — OXYBUTYNIN CHLORIDE 5 MG PO TABS: 5.0000 mg | ORAL_TABLET | Freq: Three times a day (TID) | ORAL | 1 refills | Status: DC | PRN

## 2019-01-06 MED ORDER — PROPOFOL 10 MG/ML IV BOLUS
INTRAVENOUS | Status: AC
  Filled 2019-01-06: qty 20

## 2019-01-06 SURGICAL SUPPLY — 20 items
BAG URO CATCHER STRL LF (MISCELLANEOUS) ×3 IMPLANT
BASKET ZERO TIP NITINOL 2.4FR (BASKET) ×2 IMPLANT
BSKT STON RTRVL ZERO TP 2.4FR (BASKET) ×1
CATH URET 5FR 28IN OPEN ENDED (CATHETERS) ×3 IMPLANT
CLOTH BEACON ORANGE TIMEOUT ST (SAFETY) ×3 IMPLANT
COVER WAND RF STERILE (DRAPES) IMPLANT
EXTRACTOR STONE NITINOL NGAGE (UROLOGICAL SUPPLIES) IMPLANT
FIBER LASER FLEXIVA 365 (UROLOGICAL SUPPLIES) IMPLANT
FIBER LASER TRAC TIP (UROLOGICAL SUPPLIES) ×2 IMPLANT
GLOVE BIOGEL M STRL SZ7.5 (GLOVE) ×1 IMPLANT
GOWN STRL REUS W/TWL XL LVL3 (GOWN DISPOSABLE) ×3 IMPLANT
GUIDEWIRE STR DUAL SENSOR (WIRE) IMPLANT
GUIDEWIRE ZIPWRE .038 STRAIGHT (WIRE) ×3 IMPLANT
MANIFOLD NEPTUNE II (INSTRUMENTS) ×3 IMPLANT
PACK CYSTO (CUSTOM PROCEDURE TRAY) ×3 IMPLANT
SHEATH URETERAL 12FRX35CM (MISCELLANEOUS) IMPLANT
STENT CONTOUR 6FRX24X.038 (STENTS) ×2 IMPLANT
TUBING CONNECTING 10 (TUBING) ×2 IMPLANT
TUBING CONNECTING 10' (TUBING) ×1
TUBING UROLOGY SET (TUBING) ×3 IMPLANT

## 2019-01-06 NOTE — Anesthesia Preprocedure Evaluation (Addendum)
Anesthesia Evaluation  Patient identified by MRN, date of birth, ID band Patient awake    Reviewed: Allergy & Precautions, H&P , NPO status , Patient's Chart, lab work & pertinent test results, reviewed documented beta blocker date and time   Airway Mallampati: III  TM Distance: >3 FB Neck ROM: Full    Dental no notable dental hx. (+) Teeth Intact, Dental Advisory Given   Pulmonary neg pulmonary ROS,    Pulmonary exam normal breath sounds clear to auscultation       Cardiovascular hypertension, Pt. on medications and Pt. on home beta blockers +CHF  + dysrhythmias + Valvular Problems/Murmurs AS  Rhythm:Regular Rate:Normal     Neuro/Psych Anxiety  Neuromuscular disease negative psych ROS   GI/Hepatic Neg liver ROS, hiatal hernia, GERD  Medicated and Controlled,  Endo/Other  Hypothyroidism   Renal/GU Renal disease  negative genitourinary   Musculoskeletal  (+) Arthritis , Osteoarthritis,  Fibromyalgia -  Abdominal   Peds  Hematology  (+) Blood dyscrasia, anemia ,   Anesthesia Other Findings   Reproductive/Obstetrics negative OB ROS                            Anesthesia Physical Anesthesia Plan  ASA: III  Anesthesia Plan: General   Post-op Pain Management:    Induction: Intravenous  PONV Risk Score and Plan: 4 or greater and Ondansetron, Dexamethasone and Treatment may vary due to age or medical condition  Airway Management Planned: LMA  Additional Equipment:   Intra-op Plan:   Post-operative Plan: Extubation in OR  Informed Consent: I have reviewed the patients History and Physical, chart, labs and discussed the procedure including the risks, benefits and alternatives for the proposed anesthesia with the patient or authorized representative who has indicated his/her understanding and acceptance.     Dental advisory given  Plan Discussed with: CRNA  Anesthesia Plan Comments:          Anesthesia Quick Evaluation

## 2019-01-06 NOTE — Progress Notes (Signed)
Spinal cord stimulator is off per patient and husband. Dr. Ola Spurr notified.

## 2019-01-06 NOTE — Op Note (Signed)
Operative Note  Preoperative diagnosis:  1.  Obstructing and impacted 9 mm left UVJ calculus along with multiple 6 mm left distal ureteral calculi  Postoperative diagnosis: 1.  Same  Procedure(s): 1.  Cystoscopy with left ureteroscopy, holmium laser lithotripsy and left JJ stent placement 2.  Left retrograde pyelogram with intraoperative interpretation of fluoroscopic imaging  Surgeon: Ellison Hughs, MD  Assistants:  None  Anesthesia:  General  Complications:  None  EBL: Less than 5 mL  Specimens: 1.  Left ureteral stones  Drains/Catheters: 1.  Left 6 French by 24 cm JJ stent without tether  Intraoperative findings:   1. There was a 9 mm obstructing and impacted left UVJ calculus that impeded advancement of a safety wire up the left ureter.  I used a 5 [place] ureteral catheter to aid in placing a wire, but the catheter undermined the posterior and distal aspects of the left ureter resulting in extravasation of contrast seen on retrograde pyelogram.  I used the ureteroscope to confirm wire placement in the left collecting system.  Subsequent left retrograde pyelogram revealed a dilated and tortuous proximal left ureter along with a dilated left renal pelvis with no other obvious filling defects.  Indication:  [name] is a 85 y.o. female with a long history of kidney stones.  She presented to the emergency department on 01/02/2019 with acute left-sided flank pain.  She had a CT stone study at that time that revealed multiple 6 to 9 mm left distal ureteral calculi with moderate left hydronephrosis as well as bilateral punctate renal stones.  She has been consented for the above procedures, voices understanding and wishes to proceed.  Description of procedure:  After informed consent was obtained, the patient was brought to the operating room and general LMA anesthesia was administered. The patient was then placed in the dorsolithotomy position and prepped and draped in  usual sterile fashion. A timeout was performed. A 23 French rigid cystoscope was then inserted into the urethral meatus and advanced into the bladder under direct vision. A complete bladder survey revealed no intravesical pathology.  There was a 9 mm obstructing and impacted left UVJ calculus that impeded advancement of a safety wire up the left ureter.  I used a 5 [place] ureteral catheter to aid in placing a wire, but the catheter undermined the posterior and distal aspects of the left ureter resulting in extravasation of contrast seen on retrograde pyelogram.  I used the ureteroscope to confirm wire placement in the left collecting system.  Subsequent left retrograde pyelogram revealed a dilated and tortuous proximal left ureter along with a dilated left renal pelvis with no other obvious filling defects.  I then used a 200 m holmium laser to fracture her multiple left ureteral stones into several smaller pieces.  A tipless basket was then used to extract all stone fragments from the lumen of the left ureter.  I then advanced a flexible ureteroscope up to the left renal pelvis and a full survey revealed no treatable renal stones.  The flexible ureteroscope was then removed under direct vision.  The area that was undermined by the ureteral catheter was inspected and appeared to be only 5 [place] in diameter with no other signs of ureteral trauma.  I then placed a 6 [place] by 24 cm JJ stent over the safety wire and into good position within the left collecting system, confirming placement via fluoroscopy.  The patient's bladder was drained and all stone fragments were removed.  She  tolerated the procedure well was transferred to the postanesthesia in stable condition.   Plan: I will have the patient follow-up in 2 weeks for stent removal.  Follow-up in 6 weeks with left renal ultrasound and metabolic stone work-up.

## 2019-01-06 NOTE — OR Nursing (Signed)
Stone taken per Dr. [name]

## 2019-01-06 NOTE — Transfer of Care (Signed)
Immediate Anesthesia Transfer of Care Note  Patient: [name]  Procedure(s) Performed: CYSTOSCOPY/RETROGRADE/URETEROSCOPY/HOLMIUM LASER/BASKET RETRIEVAL/STENT PLACEMENT (Left )  Patient Location: PACU  Anesthesia Type:General  Level of Consciousness: awake, alert  and oriented  Airway & Oxygen Therapy: Patient Spontanous Breathing and Patient connected to face mask oxygen  Post-op Assessment: Report given to RN and Post -op Vital signs reviewed and stable  Post vital signs: Reviewed and stable  Last Vitals:  Vitals Value Taken Time  BP    Temp    Pulse 72 01/06/2019 11:18 AM  Resp 15 01/06/2019 11:18 AM  SpO2 100 % 01/06/2019 11:18 AM  Vitals shown include unvalidated device data.  Last Pain:  Vitals:   01/06/19 0914  TempSrc:   PainSc: 0-No pain      Patients Stated Pain Goal: 4 (69/[id number]/54 [id number])  Complications: No apparent anesthesia complications

## 2019-01-06 NOTE — Anesthesia Postprocedure Evaluation (Signed)
Anesthesia Post Note  Patient: [name]  Procedure(s) Performed: CYSTOSCOPY/RETROGRADE/URETEROSCOPY/HOLMIUM LASER/BASKET RETRIEVAL/STENT PLACEMENT (Left )     Patient location during evaluation: PACU Anesthesia Type: General Level of consciousness: awake and alert Pain management: pain level controlled Vital Signs Assessment: post-procedure vital signs reviewed and stable Respiratory status: spontaneous breathing, nonlabored ventilation and respiratory function stable Cardiovascular status: blood pressure returned to baseline and stable Postop Assessment: no apparent nausea or vomiting Anesthetic complications: no    Last Vitals:  Vitals:   01/06/19 1145 01/06/19 1209  BP: 140/80 (!) 147/77  Pulse: 73 70  Resp: 12 14  Temp: 36.4 C   SpO2: 98% 99%    Last Pain:  Vitals:   01/06/19 1209  TempSrc:   PainSc: 0-No pain                 Virgal Warmuth,W. EDMOND

## 2019-01-06 NOTE — Anesthesia Procedure Notes (Signed)
Procedure Name: Intubation Date/Time: 01/06/2019 10:15 AM Performed by: Julius Matus D, CRNA Pre-anesthesia Checklist: Patient identified, Emergency Drugs available, Suction available and Patient being monitored Patient Re-evaluated:Patient Re-evaluated prior to induction Oxygen Delivery Method: Circle system utilized Preoxygenation: Pre-oxygenation with 100% oxygen Induction Type: IV induction Ventilation: Mask ventilation without difficulty LMA: LMA inserted LMA Size: 3.0 Tube type: Oral Number of attempts: 1 Placement Confirmation: positive ETCO2 and breath sounds checked- equal and bilateral Tube secured with: Tape Dental Injury: Teeth and Oropharynx as per pre-operative assessment

## 2019-01-07 ENCOUNTER — Encounter (HOSPITAL_COMMUNITY): Payer: Self-pay | Admitting: Urology

## 2019-01-09 ENCOUNTER — Telehealth: Payer: Self-pay | Admitting: Emergency Medicine

## 2019-01-09 DIAGNOSIS — N201 Calculus of ureter: Secondary | ICD-10-CM | POA: Diagnosis not present

## 2019-01-09 NOTE — Telephone Encounter (Signed)
He wants to discuss either her seeing another physician in our practice or if RB can help her.

## 2019-01-09 NOTE — Telephone Encounter (Signed)
Call made to patient husband, he states the patients SOB is getting worse and the cardiologist has cleared her from her heart being the cause. He is wanting to speak to Percival regarding her care. He states he would like to know what else he can do for her as he is aware she is not strong enough to do the pft. He states RB told him without her being able to do the PFT there is not a lot that can be done. He is asking that [place] call him to discuss this further. Aware RB is not in clinic today but will return next week. Offered an appt but he states he would rather talk with RB first.   [place] 01/01/2017  RB pleased advise. Thanks.

## 2019-01-13 NOTE — Telephone Encounter (Signed)
RB please advise. Thanks.  

## 2019-01-14 NOTE — Telephone Encounter (Signed)
Last seen here in 2018 - needs an OV to discuss. I'm not really able to sort out what's going on without seeing her.

## 2019-01-14 NOTE — Telephone Encounter (Signed)
Spoke with spouse and scheduled appt  Made him aware of office location

## 2019-01-15 ENCOUNTER — Ambulatory Visit (INDEPENDENT_AMBULATORY_CARE_PROVIDER_SITE_OTHER): Payer: Medicare Other | Admitting: Emergency Medicine

## 2019-01-15 ENCOUNTER — Ambulatory Visit (INDEPENDENT_AMBULATORY_CARE_PROVIDER_SITE_OTHER)
Admission: RE | Admit: 2019-01-15 | Discharge: 2019-01-15 | Disposition: A | Discharge status: Home / Self Care | Payer: Medicare Other | Source: Ambulatory Visit | Attending: Emergency Medicine | Admitting: Emergency Medicine

## 2019-01-15 ENCOUNTER — Encounter: Payer: Self-pay | Admitting: Emergency Medicine

## 2019-01-15 VITALS — BP 128/62 | HR 74 | Ht 62.0 in | Wt 156.6 lb

## 2019-01-15 DIAGNOSIS — R0600 Dyspnea, unspecified: Secondary | ICD-10-CM

## 2019-01-15 DIAGNOSIS — R0609 Other forms of dyspnea: Secondary | ICD-10-CM | POA: Diagnosis not present

## 2019-01-15 DIAGNOSIS — R0602 Shortness of breath: Secondary | ICD-10-CM | POA: Diagnosis not present

## 2019-01-15 MED ORDER — ALBUTEROL SULFATE HFA 108 (90 BASE) MCG/ACT IN AERS: 2.0000 | INHALATION_SPRAY | Freq: Four times a day (QID) | RESPIRATORY_TRACT | 6 refills | Status: DC | PRN

## 2019-01-15 MED ORDER — AEROCHAMBER MV MISC: 0 refills | Status: DC

## 2019-01-15 NOTE — Progress Notes (Signed)
Subjective:    Patient ID: [name], female    DOB: [birth year], 85 y.o.   MRN: [id number]  Shortness of Breath  Pertinent negatives include no ear pain, fever, headaches, leg swelling, rash, rhinorrhea, sore throat, vomiting or wheezing.   85 yo never smoker, hx of Hiatal hernia, sjogren's, hypothyroidism, Parkinson's, PVD of LLE, diastolic dysfxn, allergies. Also with OA s/p hip replacement and multiple back surgeries. She has had longstanding exertional SOB that goes back for years. She has a significant 2nd hand tobacco exposure. She doesn't hear wheezing but she does have cough > a dry, non-productive. She is on Dexilant - controls her GERD sx.   Chemical nuclear stress test 2014 > normal.   ROV 05/25/14 -- follow up for dyspnea.  Underwent PFT today > normal AF with ?? Some evidence on FV loop for obstruction.  No new issues since last time.  OV to re-establish care 10/09/16 -- 85 yo obese never smoker with a hx HTN and dCHF, hiatal hernia / GERD, parkinson's, allergic rhinitis, PVD. She returns today for evaluation of dyspnea. She reports that she is comfortable at rest. She has tachypnea and inability to take a deep breath. This will happen when walking about 1/4 mile. This is quite similar to our previous visits, not necessary worse.  She does also have leg aching w exertion.  No cough, no wheeze, no chest tightness. No congestion. She does have GERD, adequately treated w dexilant. Her wt has been stable. PFT done 05/2014 were reviewed > spirometry shows normal airflows, possible curve of the flow volume loop to suggest mild obstruction. Do not see lung volumes or DLCO.  TTE from 05/23/16 > LVEF 50-55%, mild AS, diastolic dysfxn, normal RV, estimated PASP 2mmHg. She has an implanted pain pump for back and hip pain.   ROV 01/01/17 -- This follow-up visit for patient with a history of exertional dyspnea in the setting of obesity, restrictive disease from hiatal hernia, GERD, allergic  rhinitis, diastolic CHF. At her last visit we started albuterol to be used as needed for possible obstructive disease noted on pulmonary function testing from June 2017.  She has had a fall since last time, has a shuffling gait. Last time we did a trial of albuterol to see if she would benefit - she isn't sure that it helped her in any way.   Acute OV 01/15/19 -[name] is 83, never smoker.  She returns today for further evaluation of her exertional dyspnea.  I have not seen her in 2 years.  She has obesity, hiatal hernia with GERD, allergic rhinitis, hypertension with diastolic CHF, renal calculi with recent stent placement.  She had normal spirometry in June 2015, no PFT since. She had TTE performed 12/11/18 that shows diastolic dysfunction, normal LV fxn, moderate AS. She has significant dyspnea w exertion, can also happen some times with sitting at rest.  She can walk about 150 ft with a walker before stopping. No cough, no wheezing. Weight is stable but she does have LE edema, possibly worse over several months time.     Review of Systems  Constitutional: Negative for fever and unexpected weight change.  HENT: Negative for congestion, dental problem, ear pain, nosebleeds, postnasal drip, rhinorrhea, sinus pressure, sneezing, sore throat and trouble swallowing.   Eyes: Negative for redness and itching.  Respiratory: Positive for shortness of breath. Negative for cough, chest tightness and wheezing.   Cardiovascular: Negative for palpitations and leg swelling.  Gastrointestinal: Negative for  abdominal distention, nausea and vomiting.  Genitourinary: Negative for dysuria.  Musculoskeletal: Positive for joint swelling.       Pain and stiffness  Skin: Negative for rash.  Neurological: Positive for weakness. Negative for headaches.  Hematological: Does not bruise/bleed easily.  Psychiatric/Behavioral: Negative for dysphoric mood. The patient is not nervous/anxious.    Past Medical History:    Diagnosis Date  . Anemia   . Anxiety   . Aortic stenosis    moderate by echo 2019  . Arthritis   . Broken arm    right   . Chronic diastolic CHF (congestive heart failure) ([facility])   . Family history of adverse reaction to anesthesia    pt. states sister vomits  . Fibromyalgia   . Fracture of thumb 09/01/2015   right  . Fracture, foot 09/01/2015   right  . GERD (gastroesophageal reflux disease)   . H/O hiatal hernia   . History of bronchitis   . History of kidney stones   . Hypercholesterolemia   . Hypertension    dr t turner  . Hypothyroidism   . Neuropathy   . Osteoporosis   . Parkinson disease ([facility])   . PVD (peripheral vascular disease) ([facility])    99% stenosis of left anteiror tibial artery, mod stenosis of left distal SFA and popliteal artery followed by [name]  . RBBB    noted on EKG 2018  . Restless leg   . Shortness of breath    with exertion - chronic   . Sjogren's disease ([facility])   . SOB (shortness of breath)    chronic due to diastolic dysfunction, deconditioning, obesity  . Tremor      Family History  Problem Relation Age of Onset  . Heart attack Mother   . Restless legs syndrome Mother   . Heart failure Mother   . Heart disease Mother   . Hypertension Mother   . COPD Father      Social History   Socioeconomic History  . Marital status: Married    Spouse name: [name]  . Number of children: 2  . Years of education: HS  . Highest education level: Not on file  Occupational History    Comment: Homemaker  Social Needs  . Financial resource strain: Not hard at all  . Food insecurity:    Worry: Never true    Inability: Never true  . Transportation needs:    Medical: No    Non-medical: No  Tobacco Use  . Smoking status: Never Smoker  . Smokeless tobacco: Never Used  Substance and Sexual Activity  . Alcohol use: No    Alcohol/week: 0.0 standard drinks  . Drug use: No  . Sexual activity: Never  Lifestyle  . Physical activity:    Days  per week: 2 days    Minutes per session: 30 min  . Stress: Only a little  Relationships  . Social connections:    Talks on phone: More than three times a week    Gets together: More than three times a week    Attends religious service: More than 4 times per year    Active member of club or organization: Yes    Attends meetings of clubs or organizations: More than 4 times per year    Relationship status: Married  . Intimate partner violence:    Fear of current or ex partner: No    Emotionally abused: No    Physically abused: No    Forced sexual  activity: No  Other Topics Concern  . Not on file  Social History Narrative   Patient lives at home with her spouse. Married in 1957. Two people lives in the home, no pets.    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    Caffeine Use: tea     Allergies  Allergen Reactions  . Lactose Other (See Comments)    abd pain, lactose intolerant  . Morphine Other (See Comments)  . Sulfa Antibiotics Other (See Comments)    Headache, very sick  . Codeine Other (See Comments)    Reaction:  Headaches and nightmares   . Lactose Intolerance (Gi) Nausea And Vomiting  . Latex Rash  . Lyrica [Pregabalin] Swelling and Other (See Comments)    Reaction:  Leg swelling  . Other Other (See Comments)    Pt states that pain medications give her nightmares.    . Plaquenil [Hydroxychloroquine Sulfate] Other (See Comments)    Reaction:  GI upset   . Reglan [Metoclopramide] Other (See Comments)    Reaction:  GI upset   . Requip [Ropinirole Hcl] Other (See Comments)    Reaction:  GI upset   . Septra [Sulfamethoxazole-Trimethoprim] Nausea And Vomiting  . Shellfish Allergy Nausea And Vomiting     Outpatient Medications Prior to Visit  Medication Sig Dispense Refill  . acetaminophen (TYLENOL) 325 MG tablet Take 325 mg by mouth daily.    Marland Kitchen aspirin EC 81 MG tablet Take 81 mg by mouth at bedtime.     . Calcium  Carb-Cholecalciferol (CALCIUM+D3 PO) Take 15 mLs by mouth daily.    . carbidopa-levodopa (SINEMET IR) 25-100 MG tablet Take 2 tablets by mouth 3 (three) times daily. (Patient taking differently: Take 2 tablets by mouth See admin instructions. Take 2 tablets by mouth twice daily scheduled, may take an additional dose midday as needed) 540 tablet 3  . Cholecalciferol (VITAMIN D) 2000 units CAPS Take 2,000 Units by mouth daily.    Marland Kitchen denosumab (PROLIA) 60 MG/ML SOLN injection Inject 60 mg into the skin every 6 (six) months. Administer in upper arm, thigh, or abdomen    . dexlansoprazole (DEXILANT) 60 MG capsule Take 60 mg by mouth daily.    Marland Kitchen docusate sodium (COLACE) 100 MG capsule Take 100 mg by mouth at bedtime.    . DULoxetine (CYMBALTA) 20 MG capsule TAKE (1) CAPSULE DAILY. (Patient taking differently: Take 20 mg by mouth daily. ** Do NOT chew, crush, or open capsule **) 90 capsule 0  . ezetimibe (ZETIA) 10 MG tablet TAKE 1 TABLET ONCE DAILY. (Patient taking differently: Take 10 mg by mouth every evening. ) 90 tablet 0  . FERROUS SULFATE PO Take 15 mLs by mouth daily.     Marland Kitchen levothyroxine (SYNTHROID, LEVOTHROID) 50 MCG tablet Take 50 mcg by mouth daily before breakfast.     . lovastatin (MEVACOR) 40 MG tablet TAKE 1 TABLET ONCE DAILY WITH A MEAL. (Patient taking differently: Take 40 mg by mouth daily. ) 90 tablet 0  . metoprolol tartrate (LOPRESSOR) 25 MG tablet TAKE 1 TABLET ONCE DAILY. (Patient taking differently: Take 25 mg by mouth daily. ) 90 tablet 0  . Multiple Vitamin (MULTIVITAMIN WITH MINERALS) TABS tablet Take 1 tablet by mouth daily.    . mupirocin ointment (BACTROBAN) 2 % Apply 1 application topically 2 (two) times daily. (Patient taking differently: Apply 1 application topically 2 (two) times daily. Applied to affected area of toe)  30 g 2  . nystatin (MYCOSTATIN/NYSTOP) powder Apply topically 2 (two) times daily as needed. (Patient taking differently: Apply 1 g topically 2 (two) times  daily as needed (for affected areas under breast). ) 15 g 5  . ondansetron (ZOFRAN ODT) 4 MG disintegrating tablet Take 1 tablet (4 mg total) by mouth every 8 (eight) hours as needed for nausea or vomiting. 12 tablet 0  . oxybutynin (DITROPAN) 5 MG tablet Take 1 tablet (5 mg total) by mouth every 8 (eight) hours as needed for bladder spasms. 30 tablet 1  . phenazopyridine (PYRIDIUM) 200 MG tablet Take 1 tablet (200 mg total) by mouth 3 (three) times daily as needed (for pain with urination). 30 tablet 0  . Polyethyl Glycol-Propyl Glycol (SYSTANE) 0.4-0.3 % GEL ophthalmic gel Place 1 application into both eyes at bedtime.    Vladimir Faster Glycol-Propyl Glycol (SYSTANE) 0.4-0.3 % SOLN Place 1 drop into both eyes 2 (two) times daily as needed (dry/irritated eyes.).     Marland Kitchen pramipexole (MIRAPEX) 1 MG tablet Take 1 tablet (1 mg total) by mouth 3 (three) times daily. (Patient taking differently: Take 1 mg by mouth See admin instructions. Take 1 tablet (1 mg) by mouth twice daily scheduled, may take an additional dose midday as needed) 270 tablet 3  . Skin Protectants, Misc. (EUCERIN) cream Apply 1 application topically daily.     . traMADol (ULTRAM) 50 MG tablet Take 1 tablet (50 mg total) by mouth every 6 (six) hours as needed (pain). 20 tablet 0  . trimethoprim (TRIMPEX) 100 MG tablet Take 100 mg by mouth every evening.      No facility-administered medications prior to visit.         Objective:   Physical Exam Vitals:   01/15/19 1411  BP: 128/62  Pulse: 74  SpO2: 99%  Weight: 156 lb 9.6 oz (71 kg)  Height: 5\' 2"  (1.575 m)   Gen: Pleasant, elderly woman, in no distress,  normal affect  ENT: No lesions,  mouth clear,  oropharynx clear, no postnasal drip  Neck: No JVD, no stridor  Lungs: No use of accessory muscles, clear without rales or rhonchi  Cardiovascular: RRR, heart sounds normal, no murmur or gallops, 1+ LE ankle edema  Musculoskeletal: No deformities, no cyanosis or  clubbing  Neuro: alert, non focal  Skin: Warm, no lesions or rashes       Assessment & Plan:  DOE (dyspnea on exertion) No clear evidence to suggest that her dyspnea is due to obstructive lung disease.  She had normal pulmonary function testing in 2015.  I have not been able to repeat these and she does not think she can do them.  I think a therapeutic trial of albuterol is the best way for [place] to sort out whether she has any airflow obstruction.  We will also check a chest x-ray.  I explained to her that there may be more than 1 factor contributing to her dyspnea.  She does have diastolic dysfunction although she does not appear to be decompensated at this time.  She does have lower extremity edema.  Also suspect there is a component of deconditioning here and I'd like to have her increase her exercise and conditioning if possible.  We will do a trial of a medication called albuterol.  Please take 2 puffs through a spacer about 15 minutes before you exert yourself, such as walking to the dining room.  Keep track of whether the medication helps you.  You  can use it up to every 4 hours if you were to need it. Follow-up with Dr. Radford Pax as planned.  In particular discuss the increase in your lower extremity swelling.  You may need to have adjustments made in your medications. I think you would benefit from continued physical therapy, low impact supervised exercise.  Try to participate in this at the [facility] if possible. Follow with Dr Lamonte Sakai in mid March to review your progress  Baltazar Apo, MD, PhD 01/15/2019, 2:49 PM [facility] [phone] or if no answer [phone]

## 2019-01-15 NOTE — Patient Instructions (Signed)
We will do a trial of a medication called albuterol.  Please take 2 puffs through a spacer about 15 minutes before you exert yourself, such as walking to the dining room.  Keep track of whether the medication helps you.  You can use it up to every 4 hours if you were to need it. Follow-up with Dr. Radford Pax as planned.  In particular discuss the increase in your lower extremity swelling.  You may need to have adjustments made in your medications. I think you would benefit from continued physical therapy, low impact supervised exercise.  Try to participate in this at the [facility] if possible. Follow with Dr Lamonte Sakai in mid March to review your progress

## 2019-01-15 NOTE — Assessment & Plan Note (Signed)
No clear evidence to suggest that her dyspnea is due to obstructive lung disease.  She had normal pulmonary function testing in 2015.  I have not been able to repeat these and she does not think she can do them.  I think a therapeutic trial of albuterol is the best way for [place] to sort out whether she has any airflow obstruction.  We will also check a chest x-ray.  I explained to her that there may be more than 1 factor contributing to her dyspnea.  She does have diastolic dysfunction although she does not appear to be decompensated at this time.  She does have lower extremity edema.  Also suspect there is a component of deconditioning here and I'd like to have her increase her exercise and conditioning if possible.  We will do a trial of a medication called albuterol.  Please take 2 puffs through a spacer about 15 minutes before you exert yourself, such as walking to the dining room.  Keep track of whether the medication helps you.  You can use it up to every 4 hours if you were to need it. Follow-up with Dr. Radford Pax as planned.  In particular discuss the increase in your lower extremity swelling.  You may need to have adjustments made in your medications. I think you would benefit from continued physical therapy, low impact supervised exercise.  Try to participate in this at the [facility] if possible. Follow with Dr Lamonte Sakai in mid March to review your progress

## 2019-01-19 ENCOUNTER — Encounter: Payer: Self-pay | Admitting: Podiatry

## 2019-01-19 ENCOUNTER — Ambulatory Visit (INDEPENDENT_AMBULATORY_CARE_PROVIDER_SITE_OTHER): Payer: Medicare Other | Admitting: Podiatry

## 2019-01-19 DIAGNOSIS — M79674 Pain in right toe(s): Secondary | ICD-10-CM | POA: Diagnosis not present

## 2019-01-19 DIAGNOSIS — L603 Nail dystrophy: Secondary | ICD-10-CM

## 2019-01-19 DIAGNOSIS — G8929 Other chronic pain: Secondary | ICD-10-CM

## 2019-01-19 DIAGNOSIS — L600 Ingrowing nail: Secondary | ICD-10-CM | POA: Diagnosis not present

## 2019-01-19 MED ORDER — DOXYCYCLINE HYCLATE 100 MG PO TABS: 100.0000 mg | ORAL_TABLET | Freq: Two times a day (BID) | ORAL | 0 refills | Status: DC

## 2019-01-19 NOTE — Patient Instructions (Addendum)
Once the redness and pain goes down we will likely start a Urea 40% gel on the nail to help thin it.    Soak Instructions    THE DAY AFTER THE PROCEDURE  Place 1/4 cup of epsom salts in a quart of warm tap water.  Submerge your foot or feet with outer bandage intact for the initial soak; this will allow the bandage to become moist and wet for easy lift off.  Once you remove your bandage, continue to soak in the solution for 20 minutes.  This soak should be done twice a day.  Next, remove your foot or feet from solution, blot dry the affected area and cover.  You may use a band aid large enough to cover the area or use gauze and tape.  Apply other medications to the area as directed by the doctor such as polysporin neosporin.  IF YOUR SKIN BECOMES IRRITATED WHILE USING THESE INSTRUCTIONS, IT IS OKAY TO SWITCH TO  WHITE VINEGAR AND WATER. Or you may use antibacterial soap and water to keep the toe clean  Monitor for any signs/symptoms of infection. Call the office immediately if any occur or go directly to the emergency room. Call with any questions/concerns.

## 2019-01-20 DIAGNOSIS — N201 Calculus of ureter: Secondary | ICD-10-CM | POA: Diagnosis not present

## 2019-01-20 HISTORY — PX: OTHER SURGICAL HISTORY: SHX169

## 2019-01-20 NOTE — Progress Notes (Signed)
Subjective: 85 year old female presents the office today for follow-up evaluation of toenail pain on the right big toe.  She states that it was feeling better but started to hurt again.  She denies any redness or drainage or any swelling to the toenail site.  She does get chronic redness the tips of both of her big toes but this is been more of an ongoing issue.  Since I last saw her she has been in the hospital for kidney stones on the left side. Denies any systemic complaints such as fevers, chills, nausea, vomiting. No acute changes since last appointment, and no other complaints at this time.   Objective: AAO x3, NAD DP/PT pulses palpable bilaterally, CRT less than 3 seconds The right hallux toenail is tender to palpation more upon palpation of the dorsal aspect of the toenail.  The nail is hypertrophic with yellow-brown discoloration.  There is incurvation to the distal portion of the toenail.  There is chronic erythema to the distal portion of bilateral hallux toenails.  Upon palpation of the right hallux toenail there is no drainage or pus coming from the area there is no ascending cellulitis.  There is no fluctuation or crepitation or any malodor. No open lesions or pre-ulcerative lesions.  No pain with calf compression, swelling, warmth, erythema  Assessment: Right hallux onychodystrophy, pain; chronic distal toe erythema  Plan: -All treatment options discussed with the patient including all alternatives, risks, complications.  -Today we discussed total nail avulsion.  She is still having issues with obtaining Celestone upon this.  I did sharply debride the toenail with any complications or bleeding today.  Also prescribed Keflex today for her.  I want her to continue with Epson salt soaks as well as the mupirocin ointment as she states this was helpful as well.  If symptoms continue discussed total nail avulsion.  She is hesitant to do this as well to the left side took a long time to  heal. -Patient encouraged to call the office with any questions, concerns, change in symptoms.   Trula Slade DPM

## 2019-01-21 DIAGNOSIS — K130 Diseases of lips: Secondary | ICD-10-CM | POA: Diagnosis not present

## 2019-01-23 ENCOUNTER — Telehealth: Payer: Self-pay | Admitting: Emergency Medicine

## 2019-01-23 ENCOUNTER — Telehealth: Payer: Self-pay

## 2019-01-23 NOTE — Telephone Encounter (Signed)
Charts need to be merged, pt message in another chart. Spoke to [place] and she going to have them put together. Will close this encounter.

## 2019-01-23 NOTE — Telephone Encounter (Signed)
Patients husband called requesting in writings the results of chest x-ray and a call back to speak with him [phone]

## 2019-01-23 NOTE — Telephone Encounter (Signed)
[name]       01/23/19 2:56 PM  Note    Patients husband called requesting in writings the results of chest x-ray and a call back to speak with him [phone]     I spoke with pt and she wanted a copy of the xray results mailed to her house. I advised her I would mail a copy to her house. Nothing further is needed.

## 2019-01-29 DIAGNOSIS — H353132 Nonexudative age-related macular degeneration, bilateral, intermediate dry stage: Secondary | ICD-10-CM | POA: Diagnosis not present

## 2019-01-29 DIAGNOSIS — Z79899 Other long term (current) drug therapy: Secondary | ICD-10-CM | POA: Diagnosis not present

## 2019-01-29 DIAGNOSIS — H35371 Puckering of macula, right eye: Secondary | ICD-10-CM | POA: Diagnosis not present

## 2019-02-02 ENCOUNTER — Other Ambulatory Visit: Payer: Self-pay | Admitting: Cardiology

## 2019-02-02 ENCOUNTER — Ambulatory Visit (INDEPENDENT_AMBULATORY_CARE_PROVIDER_SITE_OTHER): Payer: Medicare Other | Admitting: Podiatry

## 2019-02-02 ENCOUNTER — Encounter: Payer: Self-pay | Admitting: Podiatry

## 2019-02-02 DIAGNOSIS — L539 Erythematous condition, unspecified: Secondary | ICD-10-CM

## 2019-02-02 DIAGNOSIS — L603 Nail dystrophy: Secondary | ICD-10-CM | POA: Diagnosis not present

## 2019-02-02 DIAGNOSIS — L600 Ingrowing nail: Secondary | ICD-10-CM

## 2019-02-04 DIAGNOSIS — H903 Sensorineural hearing loss, bilateral: Secondary | ICD-10-CM | POA: Diagnosis not present

## 2019-02-06 DIAGNOSIS — F331 Major depressive disorder, recurrent, moderate: Secondary | ICD-10-CM | POA: Diagnosis not present

## 2019-02-07 NOTE — Progress Notes (Signed)
Subjective: 85 year old female presents the office today for follow-up evaluation of toenail pain on the right big toe.  They did continue with the topical antibiotic ointment.  She is not sure if she was taken the oral antibiotic.  However since I last saw her the redness has decreased the tip of the toe however it still painful on the toenail site.  Denies any redness otherwise and denies any drainage or pus.  She previously had the left hallux toenail removed previously and since then she is also had some chronic redness the tip of the toe. Denies any systemic complaints such as fevers, chills, nausea, vomiting. No acute changes since last appointment, and no other complaints at this time.   Objective: AAO x3, NAD DP/PT pulses palpable bilaterally, CRT less than 3 seconds The right hallux toenail is tender to palpation more upon palpation of the dorsal aspect of the toenail.  However the tenderness appears to be improved.  There is faint erythema to the distal portion of bilateral hallux and on the right side is actually improved compared to last appointment.  There is no drainage of pus or any ascending cellulitis.  No other areas of tenderness.  No open lesions or pre-ulcerative lesions.  No pain with calf compression, swelling, warmth, erythema  Assessment: Right hallux onychodystrophy, pain; chronic distal toe erythema  Plan: -All treatment options discussed with the patient including all alternatives, risks, complications. -Given discussed nail avulsion on the right side but she declines this.  Did debride the nail that any complications or bleeding.  Discussed we can do periodic debridements of the nail if needed.  I will see her back in the next 2 to 3 months with [place] or sooner if needed.  Monitor for any signs or symptoms of infection.  Trula Slade DPM

## 2019-02-11 NOTE — Progress Notes (Signed)
Cardiology Office Note:    Date:  02/12/2019   ID:  [name], DOB 18-Dec-1930, MRN [id number]  PCP:  [name], MD  Cardiologist:  Fransico Him, MD    Referring MD: Blanchie Serve, MD   Chief Complaint  Patient presents with  . Shortness of Breath  . Congestive Heart Failure    History of Present Illness:    [name] is a 85 y.o. female with a hx of  chronic SOB secondary to diastolic dysfunction, chronic diastolic CHF, deconditioning and obesity.Aheart cath in 2006 showed normal coronary arteries and normal LVF with normal LVEDP.She also has a history of HTN,mild aortic stenosis on echo 05/2016,chronic LE edema and PVD followed by [name].  She is here today for followup and is doing well.  She has chronic DOE which is multifactorial due to CHF, sedentary state with deconditioning and obesity. She was seen by pulmonary.  PFTs normal in 2015 and unable to perform them on last OV with pulmonary.   She has chronic LE edema and her husband says that her socks make dents in her legs.  She denies any chest pain or pressure,, PND, orthopnea, LE edema, dizziness, palpitations or syncope. She is compliant with her meds and is tolerating meds with no SE.    Past Medical History:  Diagnosis Date  . Anemia   . Anxiety   . Aortic stenosis    moderate by echo 2019  . Arthritis   . Broken arm    right   . Chronic diastolic CHF (congestive heart failure) ([place])   . Family history of adverse reaction to anesthesia    pt. states sister vomits  . Fibromyalgia   . Fracture of thumb 09/01/2015   right  . Fracture, foot 09/01/2015   right  . GERD (gastroesophageal reflux disease)   . H/O hiatal hernia   . History of bronchitis   . History of kidney stones   . Hypercholesterolemia   . Hypertension    dr t turner  . Hypothyroidism   . Neuropathy   . Osteoporosis   . Parkinson disease ([place])   . PVD (peripheral vascular disease) ([facility])    99% stenosis of left  anteiror tibial artery, mod stenosis of left distal SFA and popliteal artery followed by [name]  . RBBB    noted on EKG 2018  . Restless leg   . Shortness of breath    with exertion - chronic   . Sjogren's disease ([place])   . SOB (shortness of breath)    chronic due to diastolic dysfunction, deconditioning, obesity  . Tremor     Past Surgical History:  Procedure Laterality Date  . ABDOMINAL HYSTERECTOMY    . BACK SURGERY     4 back surgeries,   lumbar fusion  . CARDIAC CATHETERIZATION  2006   normal  . CYSTOSCOPY/URETEROSCOPY/HOLMIUM LASER/STENT PLACEMENT Left 01/06/2019   Procedure: CYSTOSCOPY/RETROGRADE/URETEROSCOPY/HOLMIUM LASER/BASKET RETRIEVAL/STENT PLACEMENT;  Surgeon: Ceasar Mons, MD;  Location: [facility];  Service: Urology;  Laterality: Left;  . EYE SURGERY Bilateral    cateracts  . falls     variious fall, broken wrist,and toes  . FRACTURE SURGERY Right April 2016   Wrist, Pt. fell  . RIGHT/LEFT HEART CATH AND CORONARY ANGIOGRAPHY N/A 02/05/2018   Procedure: RIGHT/LEFT HEART CATH AND CORONARY ANGIOGRAPHY;  Surgeon: Troy Sine, MD;  Location: [facility];  Service: Cardiovascular;  Laterality: N/A;  . SPINAL CORD STIMULATOR INSERTION N/A 09/09/2015  Procedure: LUMBAR SPINAL CORD STIMULATOR INSERTION;  Surgeon: Clydell Hakim, MD;  Location: [facility];  Service: Neurosurgery;  Laterality: N/A;  LUMBAR SPINAL CORD STIMULATOR INSERTION  . SPINE SURGERY  April 2013   Back X's 4  . TOTAL HIP ARTHROPLASTY     right  . WRIST FRACTURE SURGERY Bilateral     Current Medications: Current Meds  Medication Sig  . acetaminophen (TYLENOL) 325 MG tablet Take 325 mg by mouth daily.  Marland Kitchen albuterol (PROVENTIL HFA;VENTOLIN HFA) 108 (90 Base) MCG/ACT inhaler Inhale 2 puffs into the lungs every 6 (six) hours as needed for wheezing or shortness of breath.  Marland Kitchen aspirin EC 81 MG tablet Take 81 mg by mouth at bedtime.   . Calcium Carb-Cholecalciferol (CALCIUM+D3 PO) Take  15 mLs by mouth daily.  . carbidopa-levodopa (SINEMET IR) 25-100 MG tablet Take 2 tablets by mouth 3 (three) times daily. (Patient taking differently: Take 2 tablets by mouth See admin instructions. Take 2 tablets by mouth twice daily scheduled, may take an additional dose midday as needed)  . Cholecalciferol (VITAMIN D) 2000 units CAPS Take 2,000 Units by mouth daily.  Marland Kitchen denosumab (PROLIA) 60 MG/ML SOLN injection Inject 60 mg into the skin every 6 (six) months. Administer in upper arm, thigh, or abdomen  . dexlansoprazole (DEXILANT) 60 MG capsule Take 60 mg by mouth daily.  Marland Kitchen docusate sodium (COLACE) 100 MG capsule Take 100 mg by mouth at bedtime.  Marland Kitchen doxycycline (VIBRA-TABS) 100 MG tablet Take 1 tablet (100 mg total) by mouth 2 (two) times daily.  . DULoxetine (CYMBALTA) 20 MG capsule TAKE (1) CAPSULE DAILY. (Patient taking differently: Take 20 mg by mouth daily. ** Do NOT chew, crush, or open capsule **)  . ezetimibe (ZETIA) 10 MG tablet TAKE 1 TABLET ONCE DAILY. (Patient taking differently: Take 10 mg by mouth every evening. )  . FERROUS SULFATE PO Take 15 mLs by mouth daily.   Marland Kitchen levothyroxine (SYNTHROID, LEVOTHROID) 50 MCG tablet Take 50 mcg by mouth daily before breakfast.   . lovastatin (MEVACOR) 40 MG tablet TAKE 1 TABLET ONCE DAILY WITH A MEAL. (Patient taking differently: Take 40 mg by mouth daily. )  . metoprolol tartrate (LOPRESSOR) 25 MG tablet TAKE 1 TABLET ONCE DAILY.  . Multiple Vitamin (MULTIVITAMIN WITH MINERALS) TABS tablet Take 1 tablet by mouth daily.  . mupirocin ointment (BACTROBAN) 2 % Apply 1 application topically 2 (two) times daily. (Patient taking differently: Apply 1 application topically 2 (two) times daily. Applied to affected area of toe)  . nystatin (MYCOSTATIN/NYSTOP) powder Apply topically 2 (two) times daily as needed. (Patient taking differently: Apply 1 g topically 2 (two) times daily as needed (for affected areas under breast). )  . Polyethyl Glycol-Propyl Glycol  (SYSTANE) 0.4-0.3 % GEL ophthalmic gel Place 1 application into both eyes at bedtime.  Vladimir Faster Glycol-Propyl Glycol (SYSTANE) 0.4-0.3 % SOLN Place 1 drop into both eyes 2 (two) times daily as needed (dry/irritated eyes.).   Marland Kitchen pramipexole (MIRAPEX) 1 MG tablet Take 1 tablet (1 mg total) by mouth 3 (three) times daily. (Patient taking differently: Take 1 mg by mouth See admin instructions. Take 1 tablet (1 mg) by mouth twice daily scheduled, may take an additional dose midday as needed)  . Skin Protectants, Misc. (EUCERIN) cream Apply 1 application topically daily.   Marland Kitchen Spacer/Aero-Holding Chambers (AEROCHAMBER MV) inhaler Use as instructed  . trimethoprim (TRIMPEX) 100 MG tablet Take 100 mg by mouth every evening.      Allergies:  Lactose; Morphine; Sulfa antibiotics; Codeine; Lactose intolerance (gi); Latex; Lyrica [pregabalin]; Other; Plaquenil [hydroxychloroquine sulfate]; Reglan [metoclopramide]; Requip [ropinirole hcl]; Septra [sulfamethoxazole-trimethoprim]; and Shellfish allergy   Social History   Socioeconomic History  . Marital status: Married    Spouse name: [name]  . Number of children: 2  . Years of education: HS  . Highest education level: Not on file  Occupational History    Comment: Homemaker  Social Needs  . Financial resource strain: Not hard at all  . Food insecurity:    Worry: Never true    Inability: Never true  . Transportation needs:    Medical: No    Non-medical: No  Tobacco Use  . Smoking status: Never Smoker  . Smokeless tobacco: Never Used  Substance and Sexual Activity  . Alcohol use: No    Alcohol/week: 0.0 standard drinks  . Drug use: No  . Sexual activity: Never  Lifestyle  . Physical activity:    Days per week: 2 days    Minutes per session: 30 min  . Stress: Only a little  Relationships  . Social connections:    Talks on phone: More than three times a week    Gets together: More than three times a week    Attends religious service:  More than 4 times per year    Active member of club or organization: Yes    Attends meetings of clubs or organizations: More than 4 times per year    Relationship status: Married  Other Topics Concern  . Not on file  Social History Narrative   Patient lives at home with her spouse. Married in 1957. Two people lives in the home, no pets.    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    Caffeine Use: tea     Family History: The patient's family history includes COPD in her father; Heart attack in her mother; Heart disease in her mother; Heart failure in her mother; Hypertension in her mother; Restless legs syndrome in her mother.  ROS:   Please see the history of present illness.    ROS  All other systems reviewed and negative.   EKGs/Labs/Other Studies Reviewed:    The following studies were reviewed today: none  EKG:  EKG is ordered today and showed NSR with RBBB  Recent Labs: 07/01/2018: TSH 1.77 01/02/2019: ALT 13; BUN 14; Creatinine, Ser 1.34; Hemoglobin 9.6; Platelets 288; Potassium 4.7; Sodium 138   Recent Lipid Panel    Component Value Date/Time   CHOL 164 07/01/2018 0715   CHOL 155 01/31/2018 0821   TRIG 161 (H) 07/01/2018 0715   HDL 60 07/01/2018 0715   HDL 61 01/31/2018 0821   CHOLHDL 2.7 07/01/2018 0715   LDLCALC 77 07/01/2018 0715    Physical Exam:    VS:  BP 110/62   Pulse 77   Ht 5\' 2"  (1.575 m)   Wt 154 lb 12.8 oz (70.2 kg)   BMI 28.31 kg/m     Wt Readings from Last 3 Encounters:  02/12/19 154 lb 12.8 oz (70.2 kg)  01/15/19 156 lb 9.6 oz (71 kg)  01/06/19 156 lb (70.8 kg)     GEN:  Well nourished, well developed in no acute distress HEENT: Normal NECK: No JVD; No carotid bruits LYMPHATICS: No lymphadenopathy CARDIAC: RRR, no murmurs, rubs, gallops RESPIRATORY:  Clear to auscultation without rales, wheezing or rhonchi  ABDOMEN: Soft, non-tender, non-distended MUSCULOSKELETAL:  No edema;  No  deformity  SKIN: Warm and dry NEUROLOGIC:  Alert and oriented x 3 PSYCHIATRIC:  Normal affect   ASSESSMENT:    1. Chronic diastolic CHF (congestive heart failure) ([place])   2. Essential hypertension   3. PVD (peripheral vascular disease) ([place])   4. Nonrheumatic aortic valve stenosis   5. Hypercholesterolemia   6. SOB (shortness of breath)    PLAN:    In order of problems listed above:  1  Chronic diastolic CHF - her lungs are clear today.  She says that her legs swell but she does not have any edema on exam today.  She has not required diuretics recently. I am going to check a BNP given her ongoing SOB.  I do not think her moderate AS is playing a role in SOB.  Cath a year ago showed mild nonobstructive CAD and upper normal right heart pressures.  2.  HTN - Bp is controlled on exam today.  She will continue on Lopressor 25mg  BID.  3.  PVD - 99% stenosis of left anteiror tibial artery, mod stenosis of left distal SFA and popliteal artery followed by [name].  Continue on statin.   4.  Aortic stenosis - echo done in January showed moderate AS with a mean AVG of 40mmHg and dimensionless index of 0.34.  Repeat echo 1 year to make sure AS remains stable.  I instructed her to call if she develops worsening SOB, LE edema or develops dizziness or syncope.   5.  Hyperlipidemia - LDL goal < 70 given PVD.  She will continue on zetia 10mg  daily and lovastatin 40mg  daily.  Her LDL was 77 in July.  I will repeat an FLP and ALT.    Medication Adjustments/Labs and Tests Ordered: Current medicines are reviewed at length with the patient today.  Concerns regarding medicines are outlined above.  Orders Placed This Encounter  Procedures  . Lipid panel  . Hepatic function panel  . Pro b natriuretic peptide (BNP)  . EKG 12-Lead  . ECHOCARDIOGRAM COMPLETE   No orders of the defined types were placed in this encounter.   Signed, Fransico Him, MD  02/12/2019 10:23 AM    [place]

## 2019-02-12 ENCOUNTER — Ambulatory Visit (INDEPENDENT_AMBULATORY_CARE_PROVIDER_SITE_OTHER): Payer: Medicare Other | Admitting: Cardiology

## 2019-02-12 ENCOUNTER — Encounter: Payer: Self-pay | Admitting: Cardiology

## 2019-02-12 VITALS — BP 110/62 | HR 77 | Ht 62.0 in | Wt 154.8 lb

## 2019-02-12 DIAGNOSIS — I5032 Chronic diastolic (congestive) heart failure: Secondary | ICD-10-CM

## 2019-02-12 DIAGNOSIS — I350 Nonrheumatic aortic (valve) stenosis: Secondary | ICD-10-CM

## 2019-02-12 DIAGNOSIS — R0602 Shortness of breath: Secondary | ICD-10-CM

## 2019-02-12 DIAGNOSIS — I739 Peripheral vascular disease, unspecified: Secondary | ICD-10-CM

## 2019-02-12 DIAGNOSIS — I10 Essential (primary) hypertension: Secondary | ICD-10-CM

## 2019-02-12 DIAGNOSIS — E7800 Pure hypercholesterolemia, unspecified: Secondary | ICD-10-CM

## 2019-02-12 NOTE — Patient Instructions (Addendum)
Medication Instructions:  Your physician recommends that you continue on your current medications as directed. Please refer to the Current Medication list given to you today.  If you need a refill on your cardiac medications before your next appointment, please call your pharmacy.   Lab work: [place]: 3/16: Fasting labs: LFT, Lipid and BNP  If you have labs (blood work) drawn today and your tests are completely normal, you will receive your results only by: Marland Kitchen MyChart Message (if you have MyChart) OR . A paper copy in the mail If you have any lab test that is abnormal or we need to change your treatment, we will call you to review the results.  Testing/Procedures: Your physician has requested that you have an echocardiogram around 02/12/20. Echocardiography is a painless test that uses sound waves to create images of your heart. It provides your doctor with information about the size and shape of your heart and how well your heart's chambers and valves are working. This procedure takes approximately one hour. There are no restrictions for this procedure.  Follow-Up: At [facility], you and your health needs are our priority.  As part of our continuing mission to provide you with exceptional heart care, we have created designated Provider Care Teams.  These Care Teams include your primary Cardiologist (physician) and Advanced Practice Providers (APPs -  Physician Assistants and Nurse Practitioners) who all work together to provide you with the care you need, when you need it. You will need a follow up appointment in 1 years.  Please call our office 2 months in advance to schedule this appointment.  You may see Fransico Him, MD or one of the following Advanced Practice Providers on your designated Care Team:   [place], PA-C Melina Copa, PA-C . Ermalinda Barrios, PA-C

## 2019-02-13 DIAGNOSIS — Z87442 Personal history of urinary calculi: Secondary | ICD-10-CM | POA: Diagnosis not present

## 2019-02-13 DIAGNOSIS — N132 Hydronephrosis with renal and ureteral calculous obstruction: Secondary | ICD-10-CM | POA: Diagnosis not present

## 2019-02-23 ENCOUNTER — Other Ambulatory Visit: Payer: Medicare Other

## 2019-03-04 ENCOUNTER — Other Ambulatory Visit: Payer: Self-pay | Admitting: Nurse Practitioner

## 2019-03-05 ENCOUNTER — Other Ambulatory Visit: Payer: Self-pay

## 2019-03-05 ENCOUNTER — Encounter: Payer: Self-pay | Admitting: Nurse Practitioner

## 2019-03-05 ENCOUNTER — Non-Acute Institutional Stay: Payer: Medicare Other | Admitting: Nurse Practitioner

## 2019-03-05 DIAGNOSIS — D509 Iron deficiency anemia, unspecified: Secondary | ICD-10-CM

## 2019-03-05 DIAGNOSIS — M3500 Sicca syndrome, unspecified: Secondary | ICD-10-CM

## 2019-03-05 DIAGNOSIS — L989 Disorder of the skin and subcutaneous tissue, unspecified: Secondary | ICD-10-CM | POA: Diagnosis not present

## 2019-03-05 NOTE — Patient Instructions (Signed)
F/u dermatology for the left lower lip skin lesion. Nasal pray prn for moisturizer.

## 2019-03-05 NOTE — Assessment & Plan Note (Signed)
Left lower lip, no bleeding or ulceration, size is about a rice, duration is uncertain at least less than 2 years. Will f/u dermatology in 3 weeks.

## 2019-03-05 NOTE — Assessment & Plan Note (Signed)
Encourage the patient to use nasal moisturizer frequently.

## 2019-03-05 NOTE — Progress Notes (Signed)
Location:   clinic [place]   Place of Service:  Clinic (12) Provider: Marlana Latus NP  Code Status: DNR Goals of Care: [place] 01/05/2019  Does Patient Have a Medical Advance Directive? Yes  Type of Paramedic of [place];Living will  Does patient want to make changes to medical advance directive? No - Patient declined  Copy of [place] in Chart? No - copy requested  Would patient like information on creating a medical advance directive? -  Pre-existing out of facility DNR order (yellow form or pink MOST form) -     Chief Complaint  Patient presents with  . Acute Visit    sm spot on lip x2 with some areas inside her mouth.    HPI: Patient is a 85 y.o. female seen today for an acute visit for left lower lip skin lesion, maybe 2-3 weeks, size of rice, dark colored, no bleeding or ulceration. Pending dermatology in 3 weeks. She also c/o dry noes, sometimes small amount of bleed noted when she is not use nasal spray for moisturizer. Hx of anemia, on Fe, last Hgb 9.6 01/02/19.   Past Medical History:  Diagnosis Date  . Anemia   . Anxiety   . Aortic stenosis    moderate by echo 2019  . Arthritis   . Broken arm    right   . Chronic diastolic CHF (congestive heart failure) ([place])   . Family history of adverse reaction to anesthesia    pt. states sister vomits  . Fibromyalgia   . Fracture of thumb 09/01/2015   right  . Fracture, foot 09/01/2015   right  . GERD (gastroesophageal reflux disease)   . H/O hiatal hernia   . History of bronchitis   . History of kidney stones   . Hypercholesterolemia   . Hypertension    dr t turner  . Hypothyroidism   . Neuropathy   . Osteoporosis   . Parkinson disease ([place])   . PVD (peripheral vascular disease) ([facility])    99% stenosis of left anteiror tibial artery, mod stenosis of left distal SFA and popliteal artery followed by [name]  . RBBB    noted on EKG 2018  . Restless leg   .  Shortness of breath    with exertion - chronic   . Sjogren's disease ([place])   . SOB (shortness of breath)    chronic due to diastolic dysfunction, deconditioning, obesity  . Tremor     Past Surgical History:  Procedure Laterality Date  . ABDOMINAL HYSTERECTOMY    . BACK SURGERY     4 back surgeries,   lumbar fusion  . CARDIAC CATHETERIZATION  2006   normal  . CYSTOSCOPY/URETEROSCOPY/HOLMIUM LASER/STENT PLACEMENT Left 01/06/2019   Procedure: CYSTOSCOPY/RETROGRADE/URETEROSCOPY/HOLMIUM LASER/BASKET RETRIEVAL/STENT PLACEMENT;  Surgeon: Ceasar Mons, MD;  Location: [facility];  Service: Urology;  Laterality: Left;  . EYE SURGERY Bilateral    cateracts  . falls     variious fall, broken wrist,and toes  . FRACTURE SURGERY Right April 2016   Wrist, Pt. fell  . RIGHT/LEFT HEART CATH AND CORONARY ANGIOGRAPHY N/A 02/05/2018   Procedure: RIGHT/LEFT HEART CATH AND CORONARY ANGIOGRAPHY;  Surgeon: Troy Sine, MD;  Location: [facility];  Service: Cardiovascular;  Laterality: N/A;  . SPINAL CORD STIMULATOR INSERTION N/A 09/09/2015   Procedure: LUMBAR SPINAL CORD STIMULATOR INSERTION;  Surgeon: Clydell Hakim, MD;  Location: [facility];  Service: Neurosurgery;  Laterality: N/A;  LUMBAR SPINAL CORD STIMULATOR INSERTION  . SPINE SURGERY  April 2013   Back X's 4  . TOTAL HIP ARTHROPLASTY     right  . WRIST FRACTURE SURGERY Bilateral     Allergies  Allergen Reactions  . Lactose Other (See Comments)    abd pain, lactose intolerant  . Morphine Other (See Comments)  . Sulfa Antibiotics Other (See Comments)    Headache, very sick  . Codeine Other (See Comments)    Reaction:  Headaches and nightmares   . Lactose Intolerance (Gi) Nausea And Vomiting  . Latex Rash  . Lyrica [Pregabalin] Swelling and Other (See Comments)    Reaction:  Leg swelling  . Other Other (See Comments)    Pt states that pain medications give her nightmares.    . Plaquenil [Hydroxychloroquine Sulfate]  Other (See Comments)    Reaction:  GI upset   . Reglan [Metoclopramide] Other (See Comments)    Reaction:  GI upset   . Requip [Ropinirole Hcl] Other (See Comments)    Reaction:  GI upset   . Septra [Sulfamethoxazole-Trimethoprim] Nausea And Vomiting  . Shellfish Allergy Nausea And Vomiting    Allergies as of 03/05/2019      Reactions   Lactose Other (See Comments)   abd pain, lactose intolerant   Morphine Other (See Comments)   Sulfa Antibiotics Other (See Comments)   Headache, very sick   Codeine Other (See Comments)   Reaction:  Headaches and nightmares    Lactose Intolerance (gi) Nausea And Vomiting   Latex Rash   Lyrica [pregabalin] Swelling, Other (See Comments)   Reaction:  Leg swelling   Other Other (See Comments)   Pt states that pain medications give her nightmares.     Plaquenil [hydroxychloroquine Sulfate] Other (See Comments)   Reaction:  GI upset    Reglan [metoclopramide] Other (See Comments)   Reaction:  GI upset    Requip [ropinirole Hcl] Other (See Comments)   Reaction:  GI upset    Septra [sulfamethoxazole-trimethoprim] Nausea And Vomiting   Shellfish Allergy Nausea And Vomiting      Medication List       Accurate as of March 05, 2019 11:59 PM. Always use your most recent med list.        acetaminophen 325 MG tablet Commonly known as:  TYLENOL Take 650 mg by mouth daily.   AeroChamber MV inhaler Use as instructed   albuterol 108 (90 Base) MCG/ACT inhaler Commonly known as:  PROVENTIL HFA;VENTOLIN HFA Inhale 2 puffs into the lungs every 6 (six) hours as needed for wheezing or shortness of breath.   aspirin EC 81 MG tablet Take 81 mg by mouth at bedtime.   CALCIUM+D3 PO Take 15 mLs by mouth daily.   carbidopa-levodopa 25-100 MG tablet Commonly known as:  SINEMET IR Take 2 tablets by mouth 3 (three) times daily.   denosumab 60 MG/ML Soln injection Commonly known as:  PROLIA Inject 60 mg into the skin every 6 (six) months. Administer in  upper arm, thigh, or abdomen   Dexilant 60 MG capsule Generic drug:  dexlansoprazole Take 60 mg by mouth daily.   docusate sodium 100 MG capsule Commonly known as:  COLACE Take 100 mg by mouth at bedtime.   DULoxetine 20 MG capsule Commonly known as:  CYMBALTA TAKE (1) CAPSULE DAILY.   eucerin cream Apply 1 application topically daily.   ezetimibe 10 MG tablet Commonly known as:  ZETIA TAKE 1 TABLET ONCE DAILY.   FERROUS  SULFATE PO Take 15 mLs by mouth daily.   levothyroxine 50 MCG tablet Commonly known as:  SYNTHROID, LEVOTHROID Take 50 mcg by mouth daily before breakfast.   lovastatin 40 MG tablet Commonly known as:  MEVACOR TAKE 1 TABLET ONCE DAILY WITH A MEAL.   metoprolol tartrate 25 MG tablet Commonly known as:  LOPRESSOR TAKE 1 TABLET ONCE DAILY.   multivitamin with minerals Tabs tablet Take 1 tablet by mouth daily.   nystatin powder Commonly known as:  MYCOSTATIN/NYSTOP Apply topically 2 (two) times daily as needed.   pramipexole 1 MG tablet Commonly known as:  MIRAPEX Take 1 tablet (1 mg total) by mouth 3 (three) times daily.   Systane 0.4-0.3 % Soln Generic drug:  Polyethyl Glycol-Propyl Glycol Place 1 drop into both eyes 2 (two) times daily as needed (dry/irritated eyes.).   Systane 0.4-0.3 % Gel ophthalmic gel Generic drug:  Polyethyl Glycol-Propyl Glycol Place 1 application into both eyes at bedtime.   trimethoprim 100 MG tablet Commonly known as:  TRIMPEX Take 100 mg by mouth every evening.   Vitamin D 50 MCG (2000 UT) Caps Take 2,000 Units by mouth daily.       Review of Systems:  Husband present during today's visit.  Review of Systems  Constitutional: Negative for activity change, appetite change, chills, diaphoresis, fatigue, fever and unexpected weight change.  HENT: Positive for hearing loss and nosebleeds. Negative for congestion, rhinorrhea, sinus pressure, sinus pain and voice change.   Respiratory: Negative for cough,  shortness of breath and wheezing.   Cardiovascular: Positive for leg swelling. Negative for chest pain and palpitations.  Gastrointestinal: Negative for abdominal distention, abdominal pain, constipation, diarrhea, nausea and vomiting.  Genitourinary: Negative for difficulty urinating, dysuria and urgency.  Skin:       Skin lesion left lower lip  Neurological: Negative for dizziness, speech difficulty, weakness and headaches.       Repetitive   Psychiatric/Behavioral: Negative for agitation, behavioral problems, hallucinations and sleep disturbance. The patient is nervous/anxious.     Health Maintenance  Topic Date Due  . TETANUS/TDAP  08/31/2025  . INFLUENZA VACCINE  Completed  . DEXA SCAN  Completed  . PNA vac Low Risk Adult  Completed    Physical Exam: Vitals:   03/05/19 1309  BP: 110/70  Pulse: 70  Resp: 18  Temp: (!) 97.5 F (36.4 C)  SpO2: 98%  Weight: 155 lb (70.3 kg)  Height: 5\' 2"  (1.575 m)   Body mass index is 28.35 kg/m. Physical Exam Constitutional:      General: She is not in acute distress.    Appearance: Normal appearance. She is not ill-appearing, toxic-appearing or diaphoretic.  HENT:     Head: Normocephalic and atraumatic.     Nose: Nose normal. No congestion or rhinorrhea.     Mouth/Throat:     Mouth: Mucous membranes are dry.  Eyes:     Extraocular Movements: Extraocular movements intact.     Conjunctiva/sclera: Conjunctivae normal.     Pupils: Pupils are equal, round, and reactive to light.  Neck:     Musculoskeletal: Normal range of motion and neck supple. No neck rigidity or muscular tenderness.     Vascular: No carotid bruit.  Cardiovascular:     Rate and Rhythm: Normal rate and regular rhythm.     Heart sounds: Murmur present.  Pulmonary:     Effort: Pulmonary effort is normal.     Breath sounds: No wheezing, rhonchi or rales.  Abdominal:  General: There is no distension.     Palpations: Abdomen is soft.     Tenderness: There is no  abdominal tenderness. There is no guarding or rebound.  Musculoskeletal:     Right lower leg: Edema present.     Left lower leg: Edema present.     Comments: Trace edema BLE. Ambulates with walker.   Lymphadenopathy:     Cervical: No cervical adenopathy.  Skin:    General: Skin is warm.     Findings: Lesion present.     Comments: Left lower lip a rice sized skin lesion, no bleeding or ulceration   Neurological:     General: No focal deficit present.     Mental Status: She is alert and oriented to person, place, and time. Mental status is at baseline.     Cranial Nerves: No cranial nerve deficit.     Motor: No weakness.     Coordination: Coordination normal.     Gait: Gait abnormal.  Psychiatric:        Mood and Affect: Mood normal.        Behavior: Behavior normal.        Thought Content: Thought content normal.        Judgment: Judgment normal.     Labs reviewed: Basic Metabolic Panel: Recent Labs    04/15/18 0725 07/01/18 0715 01/02/19 1311  NA  --  141 138  K  --  4.4 4.7  CL  --  108 108  CO2  --  25 21*  GLUCOSE  --  96 118*  BUN  --  15 14  CREATININE  --  0.99* 1.34*  CALCIUM  --  9.6 9.5  TSH 1.28 1.77  --    Liver Function Tests: Recent Labs    07/01/18 0715 01/02/19 1935  AST 18 24  ALT 9 13  ALKPHOS  --  56  BILITOT 0.4 0.8  PROT 6.5 6.4*  ALBUMIN  --  3.5   Recent Labs    01/02/19 1935  LIPASE 26   No results for input(s): AMMONIA in the last 8760 hours. CBC: Recent Labs    07/01/18 0715 01/02/19 1311  WBC 5.6 10.8*  HGB 9.6* 9.6*  HCT 29.5* 31.4*  MCV 87.3 92.1  PLT 334 288   Lipid Panel: Recent Labs    07/01/18 0715  CHOL 164  HDL 60  LDLCALC 77  TRIG 161*  CHOLHDL 2.7   No results found for: HGBA1C  Procedures since last visit: No results found.  Assessment/Plan Skin lesion Left lower lip, no bleeding or ulceration, size is about a rice, duration is uncertain at least less than 2 years. Will f/u dermatology in 3  weeks.   Sjogren's syndrome ([place]) Encourage the patient to use nasal moisturizer frequently.   Iron deficiency anemia Her baseline Hgb 9s, continue Fe supplement.     Labs/tests ordered: none  Next appt:  03/12/2019

## 2019-03-05 NOTE — Assessment & Plan Note (Signed)
Her baseline Hgb 9s, continue Fe supplement.

## 2019-03-09 DIAGNOSIS — Z87442 Personal history of urinary calculi: Secondary | ICD-10-CM | POA: Diagnosis not present

## 2019-03-12 ENCOUNTER — Encounter: Payer: Medicare Other | Admitting: Nurse Practitioner

## 2019-03-16 ENCOUNTER — Telehealth: Payer: Self-pay | Admitting: Cardiology

## 2019-03-16 ENCOUNTER — Telehealth: Payer: Self-pay | Admitting: Emergency Medicine

## 2019-03-16 ENCOUNTER — Other Ambulatory Visit: Payer: Medicare Other

## 2019-03-16 NOTE — Telephone Encounter (Signed)
Returned call Pt's spouse calling re: letter received about scheduling f/u mid March. Pt has been scheduled a televisit on 4/8. He states she has been doing well on rescue inhaler. Nothing further needed.

## 2019-03-16 NOTE — Telephone Encounter (Signed)
Per pt's husband's Call -- stated they would like [facility]   [phone]   Please give pt a call on can they have this test done there.  Notes: Authorized by [name], MD Diagnosis: Hypercholesterolemia [E78.00]

## 2019-03-18 ENCOUNTER — Ambulatory Visit (INDEPENDENT_AMBULATORY_CARE_PROVIDER_SITE_OTHER): Payer: Medicare Other | Admitting: Adult Health

## 2019-03-18 ENCOUNTER — Encounter: Payer: Self-pay | Admitting: Adult Health

## 2019-03-18 ENCOUNTER — Other Ambulatory Visit: Payer: Self-pay

## 2019-03-18 DIAGNOSIS — J984 Other disorders of lung: Secondary | ICD-10-CM | POA: Diagnosis not present

## 2019-03-18 DIAGNOSIS — R0609 Other forms of dyspnea: Secondary | ICD-10-CM

## 2019-03-18 NOTE — Progress Notes (Signed)
Virtual Visit via Telephone Note  I connected with [name] on 03/18/19 at 10:30 AM EDT by telephone and verified that I am speaking with the correct person using two identifiers.   I discussed the limitations, risks, security and privacy concerns of performing an evaluation and management service by telephone and the availability of in person appointments. I also discussed with the patient that there may be a patient responsible charge related to this service. The patient expressed understanding and agreed to proceed.   History of Present Illness: Today's tele-visit is for 30-month follow-up for dyspnea. Patient is an 85 year old female never smoker followed for chronic exertional dyspnea for years.  Medical history significant for Parkinson's, hypertension, diastolic heart failure, GERD, chronic pain with implanted pain pump  Patient was seen 2 months ago for ongoing shortness of breath with activity.  She was recommend use an albuterol inhaler as needed to see if this would help.  Chest x-ray showed some bibasilar atelectasis.  And trace  bilateral pleural effusions.  It was felt that her severe kyphosis and hiatal hernia could be causing lung restriction. She was recommended to follow up with cardiology for her diastolic dysfunction .  She does feel the albuterol has helped some , feels breathing is some better with the albuterol / She is using an aerochamber . Uses it 2-3 times a week. Not everyday. Feels she is doing okay overall.  Still has lower extremity swelling worse in the evening , better in the the am.    Observations/Objective: Nuclear stress test 2014 normal PFTs 2015 normal 2D echo 2017 EF 50 to 55%, mild aortic stenosis, diastolic dysfunction, normal RV, PA P 40 mmHg 2D echo January 1660 showed diastolic dysfunction, normal LV function, moderate left ear.  Assessment and Plan: Dyspnea -suspect this is multifactorial with restrictive lung disease from significant  kyphosis and hiatal hernia.  Along with diastolic dysfunction probably contributing.  She has had some symptomatic improvement with albuterol and does not seem to be overusing this.  She can continue on her current regimen with this.  Advised her to use a low-salt diet keep her legs elevated and discuss with cardiology if any additional therapies are needed for her diastolic dysfunction however it does sound under control currently.  Plan  Patient Instructions  May use albuterol inhaler 2 puffs through spacer as needed for shortness of breath.  He can try this before walking to the dining room or activity if needed   Follow-up with Dr. Radford Pax as planned.  In particular discuss the increase in your lower extremity swelling.  You may need to have adjustments made in your medications if your leg swelling gets worse Low salt diet ,keep legs elevated.    I think you would benefit from continued physical therapy, low impact supervised exercise.  Try to participate in this at the [facility] if possible.  Follow up with Dr. Lamonte Sakai  In 4-6 months and As needed         Follow Up Instructions: Follow-up with Dr. Lamonte Sakai in 4 to 6 months and as needed Follow-up with cardiology as planned   I discussed the assessment and treatment plan with the patient. The patient was provided an opportunity to ask questions and all were answered. The patient agreed with the plan and demonstrated an understanding of the instructions.   The patient was advised to call back or seek an in-person evaluation if the symptoms worsen or if the condition fails to improve as anticipated.  I provided 22 minutes of non-face-to-face time during this encounter.   Rexene Edison, NP

## 2019-03-18 NOTE — Patient Instructions (Signed)
May use albuterol inhaler 2 puffs through spacer as needed for shortness of breath.  He can try this before walking to the dining room or activity if needed   Follow-up with Dr. Radford Pax as planned.  In particular discuss the increase in your lower extremity swelling.  You may need to have adjustments made in your medications if your leg swelling gets worse Low salt diet ,keep legs elevated.    I think you would benefit from continued physical therapy, low impact supervised exercise.  Try to participate in this at the [facility] if possible.  Follow up with Dr. Lamonte Sakai  In 4-6 months and As needed

## 2019-03-19 NOTE — Telephone Encounter (Signed)
Spoke with [facility], we would need to fax a written order to [phone]. Spoke with the husband, we will send to script over on Wednesday.

## 2019-03-25 ENCOUNTER — Telehealth: Payer: Self-pay | Admitting: Cardiology

## 2019-03-25 NOTE — Telephone Encounter (Signed)
New Message:     She said they had just received a lab order, but no diagnosis code. She needs one today please, lab is scheduled for tomorrow at 7:00 A.M..

## 2019-03-26 ENCOUNTER — Other Ambulatory Visit: Payer: Self-pay | Admitting: *Deleted

## 2019-03-26 ENCOUNTER — Telehealth: Payer: Self-pay | Admitting: *Deleted

## 2019-03-26 DIAGNOSIS — Z1231 Encounter for screening mammogram for malignant neoplasm of breast: Secondary | ICD-10-CM

## 2019-03-26 DIAGNOSIS — M810 Age-related osteoporosis without current pathological fracture: Secondary | ICD-10-CM

## 2019-03-26 NOTE — Telephone Encounter (Signed)
Sent corrected script to [facility] yesterday.

## 2019-03-26 NOTE — Telephone Encounter (Signed)
Referral For Bone Density and Occult Blood Test  Received: 2 days ago  Message Contents  [name], [place]  Cc: [name], MD  Phone Number: [phone]        [facility] called and stated that patient needs a referral to [facility] for bone density test and per GI doctor patient needs a fecal occult blood test. Patient can be reached at [phone].    Patient requested bone density test and mammogram at [facility], she stated that Dr. Delrae Rend will no longer do her Prolia injections and was looking for a replacement office to get her injection. She received her last injection from Dr.Kerr on 10/09/2018.

## 2019-03-27 ENCOUNTER — Telehealth: Payer: Self-pay | Admitting: *Deleted

## 2019-03-27 NOTE — Telephone Encounter (Signed)
Spoke with the spouse regarding the information that I received from Dr. Barbette Hair office, they stated that the Prolia shot is still being given at that office. Mr Byrum stated that he would call and get clarification because they would rather stay there.

## 2019-04-06 ENCOUNTER — Ambulatory Visit: Payer: Medicare Other | Admitting: Podiatry

## 2019-04-09 ENCOUNTER — Encounter: Payer: Self-pay | Admitting: Nurse Practitioner

## 2019-04-09 ENCOUNTER — Other Ambulatory Visit: Payer: Self-pay

## 2019-04-09 ENCOUNTER — Non-Acute Institutional Stay: Payer: Medicare Other | Admitting: Nurse Practitioner

## 2019-04-09 DIAGNOSIS — G20 Parkinson's disease: Secondary | ICD-10-CM

## 2019-04-09 DIAGNOSIS — D509 Iron deficiency anemia, unspecified: Secondary | ICD-10-CM | POA: Diagnosis not present

## 2019-04-09 DIAGNOSIS — I10 Essential (primary) hypertension: Secondary | ICD-10-CM | POA: Diagnosis not present

## 2019-04-09 DIAGNOSIS — E039 Hypothyroidism, unspecified: Secondary | ICD-10-CM | POA: Diagnosis not present

## 2019-04-09 DIAGNOSIS — K5909 Other constipation: Secondary | ICD-10-CM

## 2019-04-09 DIAGNOSIS — Z87442 Personal history of urinary calculi: Secondary | ICD-10-CM | POA: Diagnosis not present

## 2019-04-09 DIAGNOSIS — K22719 Barrett's esophagus with dysplasia, unspecified: Secondary | ICD-10-CM

## 2019-04-09 DIAGNOSIS — M810 Age-related osteoporosis without current pathological fracture: Secondary | ICD-10-CM

## 2019-04-09 NOTE — Assessment & Plan Note (Signed)
blood pressure is in controled on Metoprolol 72m po qd, update CMP/eGFR, lipid panel.

## 2019-04-09 NOTE — Assessment & Plan Note (Signed)
stable, continue Docusate 100mg  qd.

## 2019-04-09 NOTE — Assessment & Plan Note (Signed)
Not disabling, continue  Mirapex 1mg  tid, Sinemet 25/100mg  II tid.

## 2019-04-09 NOTE — Assessment & Plan Note (Signed)
Continue Levothyroxine 52mcg qd, last TSH 1.77 07/02/19. Update TSH

## 2019-04-09 NOTE — Assessment & Plan Note (Signed)
Continue Fe qd, last Hgb 9.6 01/02/19. Update CBC/diff

## 2019-04-09 NOTE — Assessment & Plan Note (Addendum)
Continue  Vit D 2000 u qd, Ca, Vit D, Prolia. Update Vit D level.

## 2019-04-09 NOTE — Assessment & Plan Note (Signed)
Stable, continue Dexilant 60mg  qd.

## 2019-04-09 NOTE — Patient Instructions (Addendum)
CBC/diff, CMP/eGFR, Vit D level, TSH, Lipid panel prior Next appt:  6 months with Dr. Lyndel Safe

## 2019-04-09 NOTE — Progress Notes (Signed)
Location:   clinic [place]   Place of Service:  Clinic (12) Provider: Marlana Latus NP  Code Status: DNR Goals of Care: [place] 04/09/2019  Does Patient Have a Medical Advance Directive? Yes  Type of Advance Directive Out of facility DNR (pink MOST or yellow form)  Does patient want to make changes to medical advance directive? No - Patient declined  Copy of [place] in Chart? -  Would patient like information on creating a medical advance directive? No - Patient declined  Pre-existing out of facility DNR order (yellow form or pink MOST form) Yellow form placed in chart (order not valid for inpatient use)     Chief Complaint  Patient presents with  . Medical Management of Chronic Issues    6 month follow up, discuss stool kit, discuss cholesterol medicine    HPI: Patient is a 85 y.o. female seen today for medical management of chronic diseases.      The patient has history of chronic cystitis, on TMP 161m qd. Parkinson disease, on Mirapex 11mtid, Sinemet 25/10059mI tid. HTN, blood pressure is in controled on Metoprolol 63m67m qd, Lovastatin 40mg54m Zetia 10mg 78m Hypothyroidism, on Levothyroxine 50mcg 21mlast TSH 1.77 07/02/19. Anemia, on Fe qd, last Hgb 9.6 01/02/19. Her mood is stable on Duloxetine 20mg qd19mnstipation, stable, on Docusate 100mg qd.64mD stable on Dexilant 60mg qd. 50moporosis, on Vit D 2000 u qd, Ca, Vit D. OA pain, stable on Tylenol 650mg qd.  85mt Medical History:  Diagnosis Date  . Anemia   . Anxiety   . Aortic stenosis    moderate by echo 2019  . Arthritis   . Broken arm    right   . Chronic diastolic CHF (congestive heart failure) (HCC)   . [place] of adverse reaction to anesthesia    pt. states sister vomits  . Fibromyalgia   . Fracture of thumb 09/01/2015   right  . Fracture, foot 09/01/2015   right  . GERD (gastroesophageal reflux disease)   . H/O hiatal hernia   . History of bronchitis   . History  of kidney stones   . Hypercholesterolemia   . Hypertension    dr t turner  . Hypothyroidism   . Neuropathy   . Osteoporosis   . Parkinson disease (HCC)   . PVSikesperipheral vascular disease) (HCC)    99% stenosis of left anteiror tibial artery, mod stenosis of left distal SFA and popliteal artery followed by Dr. Fields  . ROneida Alar noted on EKG 2018  . Restless leg   . Shortness of breath    with exertion - chronic   . Sjogren's disease (HCC)   . [place]ness of breath)    chronic due to diastolic dysfunction, deconditioning, obesity  . Tremor     Past Surgical History:  Procedure Laterality Date  . ABDOMINAL HYSTERECTOMY    . BACK SURGERY     4 back surgeries,   lumbar fusion  . CARDIAC CATHETERIZATION  2006   normal  . CYSTOSCOPY/URETEROSCOPY/HOLMIUM LASER/STENT PLACEMENT Left 01/06/2019   Procedure: CYSTOSCOPY/RETROGRADE/URETEROSCOPY/HOLMIUM LASER/BASKET RETRIEVAL/STENT PLACEMENT;  Surgeon: Winter, ChrCeasar Monstion: [facility];  Service: Urology;  Laterality: Left;  . EYE SURGERY Bilateral    cateracts  . falls     variious fall, broken wrist,and toes  . FRACTURE SURGERY Right April 2016   Wrist, Pt. fell  . kidney stone removal Left  01/20/2019  . RIGHT/LEFT HEART CATH AND CORONARY ANGIOGRAPHY N/A 02/05/2018   Procedure: RIGHT/LEFT HEART CATH AND CORONARY ANGIOGRAPHY;  Surgeon: Troy Sine, MD;  Location: [facility];  Service: Cardiovascular;  Laterality: N/A;  . SPINAL CORD STIMULATOR INSERTION N/A 09/09/2015   Procedure: LUMBAR SPINAL CORD STIMULATOR INSERTION;  Surgeon: Clydell Hakim, MD;  Location: [facility];  Service: Neurosurgery;  Laterality: N/A;  LUMBAR SPINAL CORD STIMULATOR INSERTION  . SPINE SURGERY  April 2013   Back X's 4  . TOTAL HIP ARTHROPLASTY     right  . WRIST FRACTURE SURGERY Bilateral     Allergies  Allergen Reactions  . Lactose Other (See Comments)    abd pain, lactose intolerant  . Morphine Other (See Comments)  .  Sulfa Antibiotics Other (See Comments)    Headache, very sick  . Codeine Other (See Comments)    Reaction:  Headaches and nightmares   . Lactose Intolerance (Gi) Nausea And Vomiting  . Latex Rash  . Lyrica [Pregabalin] Swelling and Other (See Comments)    Reaction:  Leg swelling  . Other Other (See Comments)    Pt states that pain medications give her nightmares.    . Plaquenil [Hydroxychloroquine Sulfate] Other (See Comments)    Reaction:  GI upset   . Reglan [Metoclopramide] Other (See Comments)    Reaction:  GI upset   . Requip [Ropinirole Hcl] Other (See Comments)    Reaction:  GI upset   . Septra [Sulfamethoxazole-Trimethoprim] Nausea And Vomiting  . Shellfish Allergy Nausea And Vomiting    Allergies as of 04/09/2019      Reactions   Lactose Other (See Comments)   abd pain, lactose intolerant   Morphine Other (See Comments)   Sulfa Antibiotics Other (See Comments)   Headache, very sick   Codeine Other (See Comments)   Reaction:  Headaches and nightmares    Lactose Intolerance (gi) Nausea And Vomiting   Latex Rash   Lyrica [pregabalin] Swelling, Other (See Comments)   Reaction:  Leg swelling   Other Other (See Comments)   Pt states that pain medications give her nightmares.     Plaquenil [hydroxychloroquine Sulfate] Other (See Comments)   Reaction:  GI upset    Reglan [metoclopramide] Other (See Comments)   Reaction:  GI upset    Requip [ropinirole Hcl] Other (See Comments)   Reaction:  GI upset    Septra [sulfamethoxazole-trimethoprim] Nausea And Vomiting   Shellfish Allergy Nausea And Vomiting      Medication List       Accurate as of April 09, 2019  4:49 PM. Always use your most recent med list.        acetaminophen 325 MG tablet Commonly known as:  TYLENOL Take 650 mg by mouth daily.   AeroChamber MV inhaler Use as instructed   albuterol 108 (90 Base) MCG/ACT inhaler Commonly known as:  VENTOLIN HFA Inhale 2 puffs into the lungs every 6 (six) hours  as needed for wheezing or shortness of breath.   aspirin EC 81 MG tablet Take 81 mg by mouth at bedtime.   CALCIUM+D3 PO Take 15 mLs by mouth daily.   carbidopa-levodopa 25-100 MG tablet Commonly known as:  SINEMET IR Take 2 tablets by mouth 3 (three) times daily.   denosumab 60 MG/ML Soln injection Commonly known as:  PROLIA Inject 60 mg into the skin every 6 (six) months. Administer in upper arm, thigh, or abdomen   Dexilant 60 MG capsule Generic  drug:  dexlansoprazole Take 60 mg by mouth daily.   docusate sodium 100 MG capsule Commonly known as:  COLACE Take 100 mg by mouth at bedtime.   DULoxetine 20 MG capsule Commonly known as:  CYMBALTA TAKE (1) CAPSULE DAILY.   eucerin cream Apply 1 application topically daily.   ezetimibe 10 MG tablet Commonly known as:  ZETIA TAKE 1 TABLET ONCE DAILY.   FERROUS SULFATE PO Take 15 mLs by mouth daily.   levothyroxine 50 MCG tablet Commonly known as:  SYNTHROID Take 50 mcg by mouth daily before breakfast.   lovastatin 40 MG tablet Commonly known as:  MEVACOR TAKE 1 TABLET ONCE DAILY WITH A MEAL.   metoprolol tartrate 25 MG tablet Commonly known as:  LOPRESSOR TAKE 1 TABLET ONCE DAILY.   multivitamin with minerals Tabs tablet Take 1 tablet by mouth daily.   nystatin powder Commonly known as:  MYCOSTATIN/NYSTOP Apply topically 2 (two) times daily as needed.   pramipexole 1 MG tablet Commonly known as:  MIRAPEX Take 1 tablet (1 mg total) by mouth 3 (three) times daily.   PRESERVISION AREDS PO Take by mouth 2 (two) times a day.   Systane 0.4-0.3 % Soln Generic drug:  Polyethyl Glycol-Propyl Glycol Place 1 drop into both eyes 2 (two) times daily as needed (dry/irritated eyes.).   trimethoprim 100 MG tablet Commonly known as:  TRIMPEX Take 100 mg by mouth every evening.   Vitamin D 50 MCG (2000 UT) Caps Take 2,000 Units by mouth daily.       Review of Systems:  Review of Systems  Constitutional:  Negative for activity change, appetite change, chills, diaphoresis, fatigue, fever and unexpected weight change.       Same weight about year ago  HENT: Positive for hearing loss. Negative for congestion and voice change.   Eyes: Negative for visual disturbance.  Respiratory: Positive for shortness of breath. Negative for cough and wheezing.        DOE, underwent Cardiology, Pulmonology  Cardiovascular: Positive for leg swelling. Negative for chest pain and palpitations.  Gastrointestinal: Negative for abdominal distention, abdominal pain, blood in stool, constipation, diarrhea, nausea and vomiting.  Genitourinary: Positive for hematuria. Negative for difficulty urinating, dysuria and urgency.       Kidney stone, f/u Nephrology.   Musculoskeletal: Positive for arthralgias, back pain and gait problem. Negative for joint swelling.  Skin: Negative for color change and pallor.  Neurological: Negative for dizziness, speech difficulty, weakness and headaches.       Repetitive  Psychiatric/Behavioral: Negative for agitation, behavioral problems, hallucinations and sleep disturbance. The patient is not nervous/anxious.     Health Maintenance  Topic Date Due  . INFLUENZA VACCINE  07/11/2019  . TETANUS/TDAP  08/31/2025  . DEXA SCAN  Completed  . PNA vac Low Risk Adult  Completed    Physical Exam: Vitals:   04/09/19 1352  BP: 102/60  Pulse: 97  Temp: 98.7 F (37.1 C)  TempSrc: Tympanic  SpO2: 97%  Weight: 160 lb 12.8 oz (72.9 kg)  Height: '5\' 2"'  (1.575 m)   Body mass index is 29.41 kg/m. Physical Exam Constitutional:      General: She is not in acute distress.    Appearance: Normal appearance. She is not ill-appearing, toxic-appearing or diaphoretic.  HENT:     Head: Normocephalic and atraumatic.     Nose: Nose normal.     Mouth/Throat:     Mouth: Mucous membranes are moist.  Eyes:     Extraocular  Movements: Extraocular movements intact.     Conjunctiva/sclera: Conjunctivae  normal.     Pupils: Pupils are equal, round, and reactive to light.  Neck:     Musculoskeletal: Normal range of motion and neck supple.  Cardiovascular:     Rate and Rhythm: Normal rate and regular rhythm.     Heart sounds: Murmur present.  Pulmonary:     Effort: Pulmonary effort is normal.     Breath sounds: No wheezing, rhonchi or rales.  Abdominal:     General: There is no distension.     Palpations: Abdomen is soft.     Tenderness: There is no abdominal tenderness. There is no right CVA tenderness, left CVA tenderness or guarding.  Genitourinary:    Rectum: Guaiac result negative.  Musculoskeletal:     Right lower leg: Edema present.     Left lower leg: Edema present.     Comments: Trace edema BLE  Skin:    General: Skin is warm and dry.  Neurological:     General: No focal deficit present.     Mental Status: She is alert. Mental status is at baseline.     Cranial Nerves: No cranial nerve deficit.     Motor: No weakness.     Coordination: Coordination normal.     Gait: Gait abnormal.     Comments: Oriented to person and place.   Psychiatric:        Mood and Affect: Mood normal.        Behavior: Behavior normal.        Thought Content: Thought content normal.        Judgment: Judgment normal.     Labs reviewed: Basic Metabolic Panel: Recent Labs    04/15/18 0725 07/01/18 0715 01/02/19 1311  NA  --  141 138  K  --  4.4 4.7  CL  --  108 108  CO2  --  25 21*  GLUCOSE  --  96 118*  BUN  --  15 14  CREATININE  --  0.99* 1.34*  CALCIUM  --  9.6 9.5  TSH 1.28 1.77  --    Liver Function Tests: Recent Labs    07/01/18 0715 01/02/19 1935  AST 18 24  ALT 9 13  ALKPHOS  --  56  BILITOT 0.4 0.8  PROT 6.5 6.4*  ALBUMIN  --  3.5   Recent Labs    01/02/19 1935  LIPASE 26   No results for input(s): AMMONIA in the last 8760 hours. CBC: Recent Labs    07/01/18 0715 01/02/19 1311  WBC 5.6 10.8*  HGB 9.6* 9.6*  HCT 29.5* 31.4*  MCV 87.3 92.1  PLT 334  288   Lipid Panel: Recent Labs    07/01/18 0715  CHOL 164  HDL 60  LDLCALC 77  TRIG 161*  CHOLHDL 2.7   No results found for: HGBA1C  Procedures since last visit: No results found.  Assessment/Plan  Hypertension blood pressure is in controled on Metoprolol 76m po qd, update CMP/eGFR, lipid panel.   Barrett esophagus Stable, continue Dexilant 681mqd.  Chronic constipation  stable, continue Docusate 10054md.  Parkinson's disease Not disabling, continue  Mirapex 1mg24md, Sinemet 25/100mg13mtid.  Osteoporosis without current pathological fracture Continue  Vit D 2000 u qd, Ca, Vit D, Prolia. Update Vit D level.   Iron deficiency anemia Continue Fe qd, last Hgb 9.6 01/02/19. Update CBC/diff  Hypothyroidism Continue Levothyroxine 50mcg65m last TSH 1.77 07/02/19.  Update TSH   Labs/tests ordered: CBC/diff, Vit D, CMP/eGFR, Lipid panel prior the appointment with Dr. Lyndel Safe in 6 months.  Next appt:  6 months with Dr. Lyndel Safe

## 2019-04-14 ENCOUNTER — Telehealth: Payer: Self-pay | Admitting: *Deleted

## 2019-04-14 NOTE — Telephone Encounter (Signed)
Called patient and spoke with husband, on [place]. Advised him Due to current COVID 19 pandemic, our office is severely reducing in person visits in order to minimize the risk to our patients and healthcare providers. We recommend to convert wife's appointment to a video visit. We'll take all precautions to reduce any security or privacy concerns. This will be treated like an office visit, and we will file with your insurance. He consented to video visit. Email is [email or web address] . Rescheduled FU for sooner, updated EMR. He verbalized understanding, appreciation. E mail sent.

## 2019-04-15 ENCOUNTER — Ambulatory Visit (INDEPENDENT_AMBULATORY_CARE_PROVIDER_SITE_OTHER): Payer: Medicare Other | Admitting: Diagnostic Neuroimaging

## 2019-04-15 ENCOUNTER — Other Ambulatory Visit: Payer: Self-pay

## 2019-04-15 ENCOUNTER — Other Ambulatory Visit: Payer: Self-pay | Admitting: *Deleted

## 2019-04-15 DIAGNOSIS — G20 Parkinson's disease: Secondary | ICD-10-CM | POA: Diagnosis not present

## 2019-04-15 DIAGNOSIS — R269 Unspecified abnormalities of gait and mobility: Secondary | ICD-10-CM

## 2019-04-15 MED ORDER — PRAMIPEXOLE DIHYDROCHLORIDE 1 MG PO TABS: 1.0000 mg | ORAL_TABLET | Freq: Two times a day (BID) | ORAL | 4 refills | Status: DC

## 2019-04-15 MED ORDER — CARBIDOPA-LEVODOPA 25-100 MG PO TABS: 2.0000 | ORAL_TABLET | Freq: Two times a day (BID) | ORAL | 4 refills | Status: DC

## 2019-04-15 NOTE — Progress Notes (Signed)
    Virtual Visit via Video Note  I connected with [name] on 04/15/19 at  1:30 PM EDT by a video enabled telemedicine application and verified that I am speaking with the correct person using two identifiers.   I discussed the limitations of evaluation and management by telemedicine and the availability of in person appointments. The patient expressed understanding and agreed to proceed.  Patient is at their home. I am at the office.    History of Present Illness:  - overall stable; continues with hallucinations (seeing her mother; having conversations) - having some knee pain and gait difficulty - tremor stable - has dry mouth (sjogrens)    Observations/Objective:  - awake and alert - hypomimia - oral dyskinesias - slow movements    Assessment and Plan:  PARKINSON'S DISEASE - reduce pramipexole 1mg  --> to twice a day (to help reduce hallucinations) - continue carb/levo 2 tabs three times per day (may reduce in future to reduce hallucinaitons) - continue using rolling walker at all times; increase supervision and assistance to avoid falls/injuries - try lidocaine topical cream for foot pain - stay active and continue stimulating activities  Meds ordered this encounter  Medications  . pramipexole (MIRAPEX) 1 MG tablet    Sig: Take 1 tablet (1 mg total) by mouth 2 (two) times a day.    Dispense:  180 tablet    Refill:  4  . carbidopa-levodopa (SINEMET IR) 25-100 MG tablet    Sig: Take 2 tablets by mouth 2 (two) times a day.    Dispense:  360 tablet    Refill:  4    Follow Up Instructions:  - Return in about 6 months (around 10/16/2019).    I discussed the assessment and treatment plan with the patient. The patient was provided an opportunity to ask questions and all were answered. The patient agreed with the plan and demonstrated an understanding of the instructions.   The patient was advised to call back or seek an in-person evaluation if the symptoms  worsen or if the condition fails to improve as anticipated.  I provided 15 minutes of non-face-to-face time during this encounter.   [name], MD 02/07/4969, 2:63 PM Certified in Neurology, Neurophysiology and Neuroimaging  [facility] [address] [place], [place] [address] [phone]

## 2019-04-16 DIAGNOSIS — N200 Calculus of kidney: Secondary | ICD-10-CM | POA: Diagnosis not present

## 2019-04-16 DIAGNOSIS — R0602 Shortness of breath: Secondary | ICD-10-CM | POA: Diagnosis not present

## 2019-04-16 DIAGNOSIS — E785 Hyperlipidemia, unspecified: Secondary | ICD-10-CM | POA: Diagnosis not present

## 2019-04-16 DIAGNOSIS — M3500 Sicca syndrome, unspecified: Secondary | ICD-10-CM | POA: Diagnosis not present

## 2019-04-16 DIAGNOSIS — Z79899 Other long term (current) drug therapy: Secondary | ICD-10-CM | POA: Diagnosis not present

## 2019-04-16 DIAGNOSIS — M545 Low back pain: Secondary | ICD-10-CM | POA: Diagnosis not present

## 2019-04-16 DIAGNOSIS — M2550 Pain in unspecified joint: Secondary | ICD-10-CM | POA: Diagnosis not present

## 2019-04-16 DIAGNOSIS — E611 Iron deficiency: Secondary | ICD-10-CM | POA: Diagnosis not present

## 2019-04-16 DIAGNOSIS — M154 Erosive (osteo)arthritis: Secondary | ICD-10-CM | POA: Diagnosis not present

## 2019-04-16 DIAGNOSIS — M810 Age-related osteoporosis without current pathological fracture: Secondary | ICD-10-CM | POA: Diagnosis not present

## 2019-04-23 ENCOUNTER — Encounter: Payer: Self-pay | Admitting: Nurse Practitioner

## 2019-04-23 ENCOUNTER — Other Ambulatory Visit: Payer: Self-pay

## 2019-04-23 ENCOUNTER — Other Ambulatory Visit: Payer: Medicare Other

## 2019-04-23 ENCOUNTER — Non-Acute Institutional Stay: Payer: Medicare Other | Admitting: Nurse Practitioner

## 2019-04-23 DIAGNOSIS — F418 Other specified anxiety disorders: Secondary | ICD-10-CM | POA: Diagnosis not present

## 2019-04-23 DIAGNOSIS — M3500 Sicca syndrome, unspecified: Secondary | ICD-10-CM | POA: Diagnosis not present

## 2019-04-23 DIAGNOSIS — L568 Other specified acute skin changes due to ultraviolet radiation: Secondary | ICD-10-CM

## 2019-04-23 DIAGNOSIS — J3489 Other specified disorders of nose and nasal sinuses: Secondary | ICD-10-CM | POA: Diagnosis not present

## 2019-04-23 HISTORY — DX: Other specified acute skin changes due to ultraviolet radiation: L56.8

## 2019-04-23 NOTE — Assessment & Plan Note (Signed)
Left lower lip per dermatology, observe.

## 2019-04-23 NOTE — Progress Notes (Addendum)
Location:   Clinic [facility]   Place of Service:  Clinic (12) Provider: Marlana Latus NP  Code Status: DNR Goals of Care: [place] 04/23/2019  Does Patient Have a Medical Advance Directive? Yes  Type of Advance Directive Out of facility DNR (pink MOST or yellow form)  Does patient want to make changes to medical advance directive? No - Patient declined  Copy of [place] in Chart? -  Would patient like information on creating a medical advance directive? No - Patient declined  Pre-existing out of facility DNR order (yellow form or pink MOST form) Yellow form placed in chart (order not valid for inpatient use)     Chief Complaint  Patient presents with  . Acute Visit    nosebleed started last week more from left than right     HPI: Patient is a 85 y.o. female seen today for an acute visit for nose bleed only when nose-blowing. She noted blood tinged nasal drainage, stops without intervention. No blood or ulceration in nostrils upon my examination today. She denied headache, facial pain/pressures, cough, chest pain or palpitation. She denied sore throat or fever.   Past Medical History:  Diagnosis Date  . Anemia   . Anxiety   . Aortic stenosis    moderate by echo 2019  . Arthritis   . Broken arm    right   . Chronic diastolic CHF (congestive heart failure) ([place])   . Family history of adverse reaction to anesthesia    pt. states sister vomits  . Fibromyalgia   . Fracture of thumb 09/01/2015   right  . Fracture, foot 09/01/2015   right  . GERD (gastroesophageal reflux disease)   . H/O hiatal hernia   . History of bronchitis   . History of kidney stones   . Hypercholesterolemia   . Hypertension    dr t turner  . Hypothyroidism   . Neuropathy   . Osteoporosis   . Parkinson disease ([place])   . PVD (peripheral vascular disease) ([facility])    99% stenosis of left anteiror tibial artery, mod stenosis of left distal SFA and popliteal artery followed by  [name]  . RBBB    noted on EKG 2018  . Restless leg   . Shortness of breath    with exertion - chronic   . Sjogren's disease ([place])   . SOB (shortness of breath)    chronic due to diastolic dysfunction, deconditioning, obesity  . Tremor     Past Surgical History:  Procedure Laterality Date  . ABDOMINAL HYSTERECTOMY    . BACK SURGERY     4 back surgeries,   lumbar fusion  . CARDIAC CATHETERIZATION  2006   normal  . CYSTOSCOPY/URETEROSCOPY/HOLMIUM LASER/STENT PLACEMENT Left 01/06/2019   Procedure: CYSTOSCOPY/RETROGRADE/URETEROSCOPY/HOLMIUM LASER/BASKET RETRIEVAL/STENT PLACEMENT;  Surgeon: Ceasar Mons, MD;  Location: [facility];  Service: Urology;  Laterality: Left;  . EYE SURGERY Bilateral    cateracts  . falls     variious fall, broken wrist,and toes  . FRACTURE SURGERY Right April 2016   Wrist, Pt. fell  . kidney stone removal Left 01/20/2019  . RIGHT/LEFT HEART CATH AND CORONARY ANGIOGRAPHY N/A 02/05/2018   Procedure: RIGHT/LEFT HEART CATH AND CORONARY ANGIOGRAPHY;  Surgeon: Troy Sine, MD;  Location: [facility];  Service: Cardiovascular;  Laterality: N/A;  . SPINAL CORD STIMULATOR INSERTION N/A 09/09/2015   Procedure: LUMBAR SPINAL CORD STIMULATOR INSERTION;  Surgeon: Clydell Hakim, MD;  Location: [facility]UR[facility];  Service: Neurosurgery;  Laterality: N/A;  LUMBAR SPINAL CORD STIMULATOR INSERTION  . SPINE SURGERY  April 2013   Back X's 4  . TOTAL HIP ARTHROPLASTY     right  . WRIST FRACTURE SURGERY Bilateral     Allergies  Allergen Reactions  . Lactose Other (See Comments)    abd pain, lactose intolerant  . Morphine Other (See Comments)  . Sulfa Antibiotics Other (See Comments)    Headache, very sick  . Codeine Other (See Comments)    Reaction:  Headaches and nightmares   . Lactose Intolerance (Gi) Nausea And Vomiting  . Latex Rash  . Lyrica [Pregabalin] Swelling and Other (See Comments)    Reaction:  Leg swelling  . Other Other (See Comments)     Pt states that pain medications give her nightmares.    . Plaquenil [Hydroxychloroquine Sulfate] Other (See Comments)    Reaction:  GI upset   . Reglan [Metoclopramide] Other (See Comments)    Reaction:  GI upset   . Requip [Ropinirole Hcl] Other (See Comments)    Reaction:  GI upset   . Septra [Sulfamethoxazole-Trimethoprim] Nausea And Vomiting  . Shellfish Allergy Nausea And Vomiting    Allergies as of 04/23/2019      Reactions   Lactose Other (See Comments)   abd pain, lactose intolerant   Morphine Other (See Comments)   Sulfa Antibiotics Other (See Comments)   Headache, very sick   Codeine Other (See Comments)   Reaction:  Headaches and nightmares    Lactose Intolerance (gi) Nausea And Vomiting   Latex Rash   Lyrica [pregabalin] Swelling, Other (See Comments)   Reaction:  Leg swelling   Other Other (See Comments)   Pt states that pain medications give her nightmares.     Plaquenil [hydroxychloroquine Sulfate] Other (See Comments)   Reaction:  GI upset    Reglan [metoclopramide] Other (See Comments)   Reaction:  GI upset    Requip [ropinirole Hcl] Other (See Comments)   Reaction:  GI upset    Septra [sulfamethoxazole-trimethoprim] Nausea And Vomiting   Shellfish Allergy Nausea And Vomiting      Medication List       Accurate as of Apr 23, 2019 11:59 PM. If you have any questions, ask your nurse or doctor.        acetaminophen 325 MG tablet Commonly known as:  TYLENOL Take 650 mg by mouth daily.   AeroChamber MV inhaler Use as instructed   albuterol 108 (90 Base) MCG/ACT inhaler Commonly known as:  VENTOLIN HFA Inhale 2 puffs into the lungs every 6 (six) hours as needed for wheezing or shortness of breath.   aspirin EC 81 MG tablet Take 81 mg by mouth at bedtime.   CALCIUM+D3 PO Take 15 mLs by mouth daily.   carbidopa-levodopa 25-100 MG tablet Commonly known as:  SINEMET IR Take 2 tablets by mouth 2 (two) times a day.   denosumab 60 MG/ML Soln  injection Commonly known as:  PROLIA Inject 60 mg into the skin every 6 (six) months. Administer in upper arm, thigh, or abdomen   Dexilant 60 MG capsule Generic drug:  dexlansoprazole Take 60 mg by mouth daily.   docusate sodium 100 MG capsule Commonly known as:  COLACE Take 100 mg by mouth at bedtime.   DULoxetine 20 MG capsule Commonly known as:  CYMBALTA TAKE (1) CAPSULE DAILY.   eucerin cream Apply 1 application topically daily.   ezetimibe 10 MG tablet Commonly  known as:  ZETIA TAKE 1 TABLET ONCE DAILY.   FERROUS SULFATE PO Take 15 mLs by mouth daily.   levothyroxine 50 MCG tablet Commonly known as:  SYNTHROID Take 50 mcg by mouth daily before breakfast.   lovastatin 40 MG tablet Commonly known as:  MEVACOR TAKE 1 TABLET ONCE DAILY WITH A MEAL.   metoprolol tartrate 25 MG tablet Commonly known as:  LOPRESSOR TAKE 1 TABLET ONCE DAILY.   multivitamin with minerals Tabs tablet Take 1 tablet by mouth daily.   nystatin powder Commonly known as:  MYCOSTATIN/NYSTOP Apply topically 2 (two) times daily as needed. What changed:    how much to take  reasons to take this   pramipexole 1 MG tablet Commonly known as:  MIRAPEX Take 1 tablet (1 mg total) by mouth 2 (two) times a day.   PRESERVISION AREDS PO Take by mouth 2 (two) times a day.   sertraline 50 MG tablet Commonly known as:  ZOLOFT Take 0.5 tablets (25 mg total) by mouth daily for 30 doses. Started by:  Sundi Slevin X Deano Tomaszewski, NP   Systane 0.4-0.3 % Soln Generic drug:  Polyethyl Glycol-Propyl Glycol Place 1 drop into both eyes 2 (two) times daily as needed (dry/irritated eyes.).   trimethoprim 100 MG tablet Commonly known as:  TRIMPEX Take 100 mg by mouth every evening.   Vitamin D 50 MCG (2000 UT) Caps Take 2,000 Units by mouth daily.       Review of Systems:  Review of Systems  Constitutional: Negative for activity change, appetite change, chills, diaphoresis, fatigue and fever.  HENT: Positive  for hearing loss and nosebleeds. Negative for congestion, facial swelling, mouth sores, postnasal drip, rhinorrhea, sinus pressure, sinus pain, sore throat and voice change.   Respiratory: Positive for shortness of breath. Negative for wheezing.        DOE, underwent pulmonology consultation. BNP 80 04/2019  Cardiovascular: Positive for leg swelling. Negative for chest pain and palpitations.  Gastrointestinal: Negative for abdominal distention, abdominal pain, constipation, diarrhea, nausea and vomiting.  Genitourinary: Negative for difficulty urinating, dysuria and urgency.  Musculoskeletal: Positive for back pain and gait problem.  Skin: Negative for color change and pallor.       Left lower lip skin lesion  Neurological: Positive for tremors. Negative for dizziness, speech difficulty and headaches.       Repetitive  Psychiatric/Behavioral: Positive for dysphoric mood. Negative for agitation, behavioral problems, hallucinations and sleep disturbance. The patient is not nervous/anxious.     Health Maintenance  Topic Date Due  . INFLUENZA VACCINE  07/11/2019  . TETANUS/TDAP  08/31/2025  . DEXA SCAN  Completed  . PNA vac Low Risk Adult  Completed    Physical Exam: Vitals:   04/23/19 1458  BP: 116/72  Pulse: 76  SpO2: 98%  Weight: 159 lb 6.4 oz (72.3 kg)  Height: 5\' 2"  (1.575 m)   Body mass index is 29.15 kg/m. Physical Exam Vitals signs and nursing note reviewed.  Constitutional:      General: She is not in acute distress.    Appearance: Normal appearance. She is not ill-appearing, toxic-appearing or diaphoretic.  HENT:     Head: Normocephalic and atraumatic.     Nose: Nose normal. No congestion or rhinorrhea.     Mouth/Throat:     Mouth: Mucous membranes are moist.  Eyes:     Extraocular Movements: Extraocular movements intact.     Conjunctiva/sclera: Conjunctivae normal.     Pupils: Pupils are equal, round, and  reactive to light.  Neck:     Musculoskeletal: Normal  range of motion and neck supple.  Cardiovascular:     Rate and Rhythm: Normal rate and regular rhythm.     Heart sounds: Murmur present.  Pulmonary:     Effort: Pulmonary effort is normal.     Breath sounds: No wheezing, rhonchi or rales.  Abdominal:     Palpations: Abdomen is soft.     Tenderness: There is no abdominal tenderness. There is no right CVA tenderness, left CVA tenderness, guarding or rebound.  Musculoskeletal:     Right lower leg: Edema present.     Left lower leg: Edema present.     Comments: Trace edema BLE  Skin:    General: Skin is warm and dry.     Comments: Left lower lip skin lesion  Neurological:     General: No focal deficit present.     Mental Status: She is alert. Mental status is at baseline.     Cranial Nerves: No cranial nerve deficit.     Motor: No weakness.     Coordination: Coordination normal.     Gait: Gait abnormal.     Comments: Oriented to person and place.   Psychiatric:        Mood and Affect: Mood normal.        Behavior: Behavior normal.        Thought Content: Thought content normal.     Labs reviewed: Basic Metabolic Panel: Recent Labs    07/01/18 0715 01/02/19 1311  NA 141 138  K 4.4 4.7  CL 108 108  CO2 25 21*  GLUCOSE 96 118*  BUN 15 14  CREATININE 0.99* 1.34*  CALCIUM 9.6 9.5  TSH 1.77  --    Liver Function Tests: Recent Labs    07/01/18 0715 01/02/19 1935  AST 18 24  ALT 9 13  ALKPHOS  --  56  BILITOT 0.4 0.8  PROT 6.5 6.4*  ALBUMIN  --  3.5   Recent Labs    01/02/19 1935  LIPASE 26   No results for input(s): AMMONIA in the last 8760 hours. CBC: Recent Labs    07/01/18 0715 01/02/19 1311  WBC 5.6 10.8*  HGB 9.6* 9.6*  HCT 29.5* 31.4*  MCV 87.3 92.1  PLT 334 288   Lipid Panel: Recent Labs    07/01/18 0715  CHOL 164  HDL 60  LDLCALC 77  TRIG 161*  CHOLHDL 2.7   No results found for: HGBA1C  Procedures since last visit: No results found.  Assessment/Plan Nose dryness Recommended  the patient to use nasal saline gel nightly. Observe.   Sjogren's syndrome ([place]) This may be contributory to dry nose  Actinic cheilitis Left lower lip per dermatology, observe.   Anxiety about health Lianne Cure recommended trial of Sertraline 25mg  qd, f/u neurology.     Labs/tests ordered: none  Next appt:  10/06/2019

## 2019-04-23 NOTE — Assessment & Plan Note (Signed)
Recommended the patient to use nasal saline gel nightly. Observe.

## 2019-04-23 NOTE — Patient Instructions (Signed)
Use OTC nasal saline gel moisturizer nightly. Observe the left lower lip skin lesion for change. F/u dermatology prn.

## 2019-04-23 NOTE — Assessment & Plan Note (Signed)
This may be contributory to dry nose

## 2019-04-24 ENCOUNTER — Other Ambulatory Visit: Payer: Self-pay | Admitting: Cardiology

## 2019-04-29 ENCOUNTER — Ambulatory Visit: Payer: Medicare Other | Admitting: Diagnostic Neuroimaging

## 2019-05-01 ENCOUNTER — Telehealth: Payer: Self-pay

## 2019-05-01 DIAGNOSIS — E7800 Pure hypercholesterolemia, unspecified: Secondary | ICD-10-CM

## 2019-05-01 MED ORDER — ROSUVASTATIN CALCIUM 20 MG PO TABS: 20.0000 mg | ORAL_TABLET | Freq: Every day | ORAL | 3 refills | Status: DC

## 2019-05-01 NOTE — Telephone Encounter (Signed)
-----   Message from Leeroy Bock, [facility] sent at 04/17/2019  3:54 PM EDT ----- Would switch lovastatin to rosuvastatin 20mg  daily to target LDL < 70 due to PVD. This should help lower TG a bit more, but would also encourage pt to watch intake of sugar and carbs. Can recheck lipids in 3 months to assess response to therapy change.

## 2019-05-01 NOTE — Telephone Encounter (Signed)
The patient has been notified of the result and verbalized understanding.  All questions (if any) were answered. Sarina Ill, RN 05/01/2019 4:15 PM

## 2019-05-07 DIAGNOSIS — F331 Major depressive disorder, recurrent, moderate: Secondary | ICD-10-CM | POA: Diagnosis not present

## 2019-05-08 DIAGNOSIS — F323 Major depressive disorder, single episode, severe with psychotic features: Secondary | ICD-10-CM | POA: Insufficient documentation

## 2019-05-08 MED ORDER — SERTRALINE HCL 50 MG PO TABS: 25.0000 mg | ORAL_TABLET | Freq: Every day | ORAL | 3 refills | Status: DC

## 2019-05-08 NOTE — Assessment & Plan Note (Signed)
Juliann Pulse Schowfety recommended trial of Sertraline 25mg  qd, f/u neurology.

## 2019-05-08 NOTE — Addendum Note (Signed)
Addended by: [name], MAN X on: 05/08/2019 11:16 AM   Modules accepted: Orders

## 2019-05-11 DIAGNOSIS — M810 Age-related osteoporosis without current pathological fracture: Secondary | ICD-10-CM | POA: Diagnosis not present

## 2019-05-19 ENCOUNTER — Ambulatory Visit: Payer: Medicare Other | Admitting: Podiatry

## 2019-05-19 DIAGNOSIS — L570 Actinic keratosis: Secondary | ICD-10-CM | POA: Diagnosis not present

## 2019-05-21 DIAGNOSIS — F331 Major depressive disorder, recurrent, moderate: Secondary | ICD-10-CM | POA: Diagnosis not present

## 2019-05-26 ENCOUNTER — Encounter: Payer: Self-pay | Admitting: Podiatry

## 2019-05-26 ENCOUNTER — Ambulatory Visit (INDEPENDENT_AMBULATORY_CARE_PROVIDER_SITE_OTHER): Payer: Medicare Other

## 2019-05-26 ENCOUNTER — Ambulatory Visit (INDEPENDENT_AMBULATORY_CARE_PROVIDER_SITE_OTHER): Payer: Medicare Other | Admitting: Podiatry

## 2019-05-26 ENCOUNTER — Other Ambulatory Visit: Payer: Self-pay

## 2019-05-26 VITALS — Temp 97.6°F

## 2019-05-26 DIAGNOSIS — M7742 Metatarsalgia, left foot: Secondary | ICD-10-CM | POA: Diagnosis not present

## 2019-05-26 DIAGNOSIS — M10071 Idiopathic gout, right ankle and foot: Secondary | ICD-10-CM | POA: Diagnosis not present

## 2019-05-26 DIAGNOSIS — L600 Ingrowing nail: Secondary | ICD-10-CM

## 2019-05-26 DIAGNOSIS — R609 Edema, unspecified: Secondary | ICD-10-CM | POA: Diagnosis not present

## 2019-05-27 LAB — CBC WITH DIFFERENTIAL/PLATELET
Absolute Monocytes: 688 cells/uL (ref 200–950)
Basophils Absolute: 22 cells/uL (ref 0–200)
Basophils Relative: 0.3 %
Eosinophils Absolute: 348 cells/uL (ref 15–500)
Eosinophils Relative: 4.7 %
HCT: 29.8 % — ABNORMAL LOW (ref 35.0–45.0)
Hemoglobin: 9.4 g/dL — ABNORMAL LOW (ref 11.7–15.5)
Lymphs Abs: 1480 cells/uL (ref 850–3900)
MCH: 28.2 pg (ref 27.0–33.0)
MCHC: 31.5 g/dL — ABNORMAL LOW (ref 32.0–36.0)
MCV: 89.5 fL (ref 80.0–100.0)
MPV: 9.9 fL (ref 7.5–12.5)
Monocytes Relative: 9.3 %
Neutro Abs: 4862 cells/uL (ref 1500–7800)
Neutrophils Relative %: 65.7 %
Platelets: 302 10*3/uL (ref 140–400)
RBC: 3.33 10*6/uL — ABNORMAL LOW (ref 3.80–5.10)
RDW: 13.6 % (ref 11.0–15.0)
Total Lymphocyte: 20 %
WBC: 7.4 10*3/uL (ref 3.8–10.8)

## 2019-05-27 LAB — BASIC METABOLIC PANEL
BUN: 13 mg/dL (ref 7–25)
CO2: 24 mmol/L (ref 20–32)
Calcium: 9 mg/dL (ref 8.6–10.4)
Chloride: 110 mmol/L (ref 98–110)
Creat: 0.65 mg/dL (ref 0.60–0.88)
Glucose, Bld: 97 mg/dL (ref 65–139)
Potassium: 4.1 mmol/L (ref 3.5–5.3)
Sodium: 143 mmol/L (ref 135–146)

## 2019-05-27 LAB — C-REACTIVE PROTEIN: CRP: 1 mg/L (ref <8.0)

## 2019-05-27 LAB — URIC ACID: Uric Acid, Serum: 3.4 mg/dL (ref 2.5–7.0)

## 2019-05-27 LAB — SEDIMENTATION RATE: Sed Rate: 25 mm/h (ref 0–30)

## 2019-05-29 ENCOUNTER — Telehealth: Payer: Self-pay | Admitting: *Deleted

## 2019-05-29 NOTE — Telephone Encounter (Signed)
I informed pt's husband, Saralyn Pilar of Dr. Leigh Aurora review of results and orders.

## 2019-05-29 NOTE — Telephone Encounter (Signed)
-----   Message from Trula Slade, DPM sent at 05/27/2019 10:41 AM EDT ----- Tivis Ringer- please let her know that her blood work is normal including uric acid; CBC is stable. I would do topical voltaren gel on the right foot, remain in surgical shoe, ice. Follow up as scheduled or sooner if needed.

## 2019-05-30 ENCOUNTER — Other Ambulatory Visit: Payer: Self-pay | Admitting: Nurse Practitioner

## 2019-06-01 ENCOUNTER — Other Ambulatory Visit: Payer: Self-pay | Admitting: Nurse Practitioner

## 2019-06-01 ENCOUNTER — Telehealth: Payer: Self-pay | Admitting: *Deleted

## 2019-06-01 ENCOUNTER — Telehealth: Payer: Self-pay

## 2019-06-01 NOTE — Progress Notes (Signed)
Subjective: 85 year old female presents the office today for concerns of pain to her left foot as well as her right foot.  She states the left foot starting about ball area on the right foot is very warm and top and she has noticed a small red area on top of her foot as well.  She denies any recent injury or trauma she denies any falls.  She is not changed her diet recently.  She still gets pain to both of her big toes and the nails.  This is been more of an ongoing issue. Denies any systemic complaints such as fevers, chills, nausea, vomiting. No acute changes since last appointment, and no other complaints at this time.   Objective: AAO x3, NAD DP/PT pulses palpable bilaterally, CRT less than 3 seconds The majority of tenderness is localized to the dorsal aspect the right foot.  Mild erythema to the dorsal and foot and mild swelling.  There is no specific area pinpoint tenderness.  On the left side tenderness is more along the submetatarsal area not significant as the right side.  Hallux toenails are dystrophic and there is mild chronic erythema of the distal toes.there is no increase in warmth there is no drainage of pus or signs of infection.  No open lesions or pre-ulcerative lesions.  No pain with calf compression, swelling, warmth, erythema  Assessment: Right foot capsulitis, likely gout; metatarsalgia; onychomycosis  Plan: -All treatment options discussed with the patient including all alternatives, risks, complications.  -X-rays obtained reviewed.  No evidence of acute fracture identified today. -Regards to her right foot pain recommend immobilization in surgical shoe.  Also prescribed Voltaren gel.  I ordered blood work including CBC, uric acid, BMP, CBC, sed rate. -Debrided toenails but any complications or bleeding -Metatarsal offloading pads -Patient encouraged to call the office with any questions, concerns, change in symptoms.   Trula Slade DPM

## 2019-06-01 NOTE — Telephone Encounter (Signed)
Pt's husband, Saralyn Pilar called states pt's foot is still very swollen not responding to the Voltaren cream, it will be 2 weeks before they see Dr. Jacqualyn Posey again and would like to know if they need to go to the vascular doctor or what to do.

## 2019-06-01 NOTE — Telephone Encounter (Signed)
Patients husband called and said that she is having numbness, pain and swelling in her feet and wanted to know if they need to be seen here or at her heart doctor.   Called and advised husband that the first place I would start would be her podiatrist given they just saw him last week for these same symptoms and he can better direct her as to what is needed now if the treatment that he has given her is not working.   York Cerise, CMA

## 2019-06-02 NOTE — Telephone Encounter (Signed)
See if she can come in this week. We can do an injection to see if that will help. Continue to ice/elevate for now.

## 2019-06-02 NOTE — Telephone Encounter (Signed)
I informed pt and husband, [name] of Dr. Leigh Aurora orders and transferred to schedulers.

## 2019-06-04 ENCOUNTER — Ambulatory Visit (INDEPENDENT_AMBULATORY_CARE_PROVIDER_SITE_OTHER): Payer: Medicare Other | Admitting: Podiatry

## 2019-06-04 ENCOUNTER — Other Ambulatory Visit: Payer: Self-pay

## 2019-06-04 DIAGNOSIS — M779 Enthesopathy, unspecified: Secondary | ICD-10-CM

## 2019-06-04 DIAGNOSIS — M10071 Idiopathic gout, right ankle and foot: Secondary | ICD-10-CM

## 2019-06-08 NOTE — Progress Notes (Signed)
Subjective: 85 year old female presents the office today for concerns of right foot pain and swelling follow-up.  She states that overall she is doing much better but she still gets pain she points mostly the first MPJ.  After talking with her today she states that when this started she felt fine the night before she was having no discomfort and when she woke up in the mornings when she developed pain and swelling.  No injury. Denies any systemic complaints such as fevers, chills, nausea, vomiting. No acute changes since last appointment, and no other complaints at this time.   Objective: AAO x3, NAD DP/PT pulses palpable bilaterally, CRT less than 3 seconds There is mild diffuse tenderness to the right but the majority of tenderness appears to be localized to the first MPJ.  There is minimal edema there is no erythema or warmth today.  Tenderness along medial aspect of first metatarsal head.  No open lesions or pre-ulcerative lesions.  No pain with calf compression, swelling, warmth, erythema  Assessment: Capsulitis, gout  Plan: -All treatment options discussed with the patient including all alternatives, risks, complications.  -Steroid injection performed the first MPJ today.  See procedure note below.  Discussed diet modifications.  She can transition back into a regular shoe as able. -Patient encouraged to call the office with any questions, concerns, change in symptoms.   Procedure: Injection small joint Discussed alternatives, risks, complications and verbal consent was obtained.  Location: Right first MPJ Skin Prep: Betadine/alcohol Injectate: 0.5cc 0.5% marcaine plain, 0.5 cc 2% lidocaine plain and, 1 cc kenalog 10. Disposition: Patient tolerated procedure well. Injection site dressed with a band-aid.  Post-injection care was discussed and return precautions discussed.    Return in about 4 weeks (around 07/02/2019).  Trula Slade DPM

## 2019-06-09 DIAGNOSIS — L578 Other skin changes due to chronic exposure to nonionizing radiation: Secondary | ICD-10-CM | POA: Diagnosis not present

## 2019-06-10 ENCOUNTER — Telehealth: Payer: Self-pay | Admitting: *Deleted

## 2019-06-10 MED ORDER — SERTRALINE HCL 50 MG PO TABS: 25.0000 mg | ORAL_TABLET | Freq: Every day | ORAL | 3 refills | Status: DC

## 2019-06-10 NOTE — Telephone Encounter (Signed)
Fax from pharmacy stating that pt is now taking 1 whole tablet of Zoloft instead of 1/2 tablet, please advise on what is correct and send to pharmacy.

## 2019-06-11 ENCOUNTER — Ambulatory Visit: Payer: Medicare Other | Admitting: Podiatry

## 2019-06-11 ENCOUNTER — Other Ambulatory Visit: Payer: Self-pay | Admitting: Nurse Practitioner

## 2019-06-11 NOTE — Telephone Encounter (Signed)
Anxiety about health Lianne Cure recommended trial of Sertraline 25mg  qd, f/u neurology.    Spoke with patients husband, and he states pt was to take a 1/2 tablet then increase to 1 whole tablet, per the office notes (copied above) pt should be on 1 tablet of the 25mg  strength. Med list updated.

## 2019-06-11 NOTE — Addendum Note (Signed)
Addended by: Despina Hidden on: 06/11/2019 01:00 PM   Modules accepted: Orders

## 2019-06-16 DIAGNOSIS — F331 Major depressive disorder, recurrent, moderate: Secondary | ICD-10-CM | POA: Diagnosis not present

## 2019-06-17 ENCOUNTER — Telehealth: Payer: Self-pay | Admitting: Diagnostic Neuroimaging

## 2019-06-17 ENCOUNTER — Other Ambulatory Visit: Payer: Self-pay | Admitting: Nurse Practitioner

## 2019-06-17 MED ORDER — SERTRALINE HCL 50 MG PO TABS: 50.0000 mg | ORAL_TABLET | Freq: Every day | ORAL | 3 refills | Status: DC

## 2019-06-17 NOTE — Telephone Encounter (Signed)
LVM for [facility] requesting call back.

## 2019-06-17 NOTE — Telephone Encounter (Signed)
[name] called again stated that she will stop her session when you give her a call back because she really needs to speak with you.

## 2019-06-17 NOTE — Telephone Encounter (Signed)
Mild reduction of PD meds could be considered. Also nuplazid could be considered. -VRP

## 2019-06-17 NOTE — Telephone Encounter (Signed)
[name] returned call. She states she may be in session with another pt when you call back but just leave her a message if she is.

## 2019-06-17 NOTE — Telephone Encounter (Signed)
[facility] APRN called and requested to speak with Dr. Cassell Smiles nurse regarding this patient. Please call and advise [phone]. DW

## 2019-06-17 NOTE — Telephone Encounter (Signed)
LVM for [name] advising her of Dr Gladstone Lighter reply. I advised no call back needed unless she has questions; left number.

## 2019-06-17 NOTE — Telephone Encounter (Signed)
Returned call to Grady, psych clinical nurse specialist. She is seeing patient for anxiety, depression issues as Optometrist  at [employer]. She stated that she had a tele visit today, and the patient continues to have visual and auditory hallucinations which are made worse by her anxiety. Tye Maryland is teaching her coping mechanisms, and the patient has started Zoloft 25 mg but recently increased to 50 mg daily. Tye Maryland is hopeful Zoloft will help her cope with hallucinations. Tye Maryland stated the patient doesn't want to decrease Parkinson's med doses any further, but Tye Maryland wanted to know if Dr Leta Baptist had any other plans regarding her hallucinations.  Tye Maryland stated that at times seroquel is prescribed however the dose is typically too much for the patient to tolerate.  I reviewed Dr Gladstone Lighter plan per last note re: reducing meds to twice a day to help reduce hallucinations. I stated that Dr Leta Baptist would most likely want to see if the patient handles hallucinations better after being on Zoloft for a while, however I will make him aware of our conversation. Tye Maryland stated she would call patient and husband and advise,  and she will have husband call office to schedule her 6 month follow up.  Cathy verbalized understanding, appreciation.

## 2019-07-01 DIAGNOSIS — F331 Major depressive disorder, recurrent, moderate: Secondary | ICD-10-CM | POA: Diagnosis not present

## 2019-07-01 NOTE — Telephone Encounter (Signed)
Received call from [facility] who stated she wanted to give Dr Leta Baptist an update. The patient continues to have daily hallucinations. She has been on Zoloft 50 mg x 2 weeks which is helping with her anxiety about hallucinations. She did fall getting out of bed this morning possibly due to slippery floor, no injuries reported by SNF staff.  I informed Tye Maryland Dr Leta Baptist is out of office until Monday, but I can send message ot work in dr. She stated this can wait, is not an emergency. I advised her I will let Dr Leta Baptist know and call her for any medication changes, recommendations. She  verbalized understanding, appreciation.

## 2019-07-01 NOTE — Telephone Encounter (Signed)
Tye Maryland Electrical engineer Nurse) Phone # [phone]

## 2019-07-02 ENCOUNTER — Other Ambulatory Visit: Payer: Self-pay

## 2019-07-02 ENCOUNTER — Ambulatory Visit (INDEPENDENT_AMBULATORY_CARE_PROVIDER_SITE_OTHER): Payer: Medicare Other | Admitting: Podiatry

## 2019-07-02 ENCOUNTER — Ambulatory Visit (INDEPENDENT_AMBULATORY_CARE_PROVIDER_SITE_OTHER): Payer: Medicare Other

## 2019-07-02 DIAGNOSIS — I83893 Varicose veins of bilateral lower extremities with other complications: Secondary | ICD-10-CM | POA: Diagnosis not present

## 2019-07-02 DIAGNOSIS — M19079 Primary osteoarthritis, unspecified ankle and foot: Secondary | ICD-10-CM | POA: Diagnosis not present

## 2019-07-02 DIAGNOSIS — M779 Enthesopathy, unspecified: Secondary | ICD-10-CM | POA: Diagnosis not present

## 2019-07-02 DIAGNOSIS — M109 Gout, unspecified: Secondary | ICD-10-CM

## 2019-07-02 DIAGNOSIS — R609 Edema, unspecified: Secondary | ICD-10-CM | POA: Diagnosis not present

## 2019-07-02 MED ORDER — GABAPENTIN 100 MG PO CAPS: 100.0000 mg | ORAL_CAPSULE | Freq: Every day | ORAL | 0 refills | Status: DC

## 2019-07-07 ENCOUNTER — Other Ambulatory Visit: Payer: Self-pay | Admitting: Podiatry

## 2019-07-07 DIAGNOSIS — M109 Gout, unspecified: Secondary | ICD-10-CM

## 2019-07-07 DIAGNOSIS — M779 Enthesopathy, unspecified: Secondary | ICD-10-CM

## 2019-07-08 NOTE — Progress Notes (Signed)
Subjective: 85 year old female presents the office today for follow-up evaluation of bilateral foot pain.  She states that the pain in the top of the right foot and color is better but still gets some occasional swelling.  She gets an occasional pain to the left foot as well on the big toe.  Denies any recent injury or changes otherwise.  She is been chronic swelling to both uppercase.  Denies any systemic complaints such as fevers, chills, nausea, vomiting. No acute changes since last appointment, and no other complaints at this time.   Objective: AAO x3, NAD DP, PT pulses palpable. Mild bilateral pitting lower extremity edema without any erythema or warmth.  The pain on the dorsal aspect the right foot has much improved.  Normal color bilaterally.  Not able to elicit any significant tenderness today. Mild varicose veins present. No open lesions or pre-ulcerative lesions.  No pain with calf compression, swelling, warmth, erythema  Assessment: Chronic bilateral foot pain; edema  Plan: -All treatment options discussed with the patient including all alternatives, risks, complications.  -She has compression stockings at home.  I want her to continue this.  Encouraged elevation.  Also encouraged her to follow-up with her cardiologist in regards to the swelling.  I do think she still had some component of gout to the right foot but this is improved.  She continues Voltaren gel topically.  Ice the area as well. -Patient encouraged to call the office with any questions, concerns, change in symptoms.   Trula Slade DPM

## 2019-07-14 ENCOUNTER — Other Ambulatory Visit: Payer: Self-pay

## 2019-07-14 ENCOUNTER — Other Ambulatory Visit: Payer: Medicare Other

## 2019-07-14 DIAGNOSIS — E7800 Pure hypercholesterolemia, unspecified: Secondary | ICD-10-CM | POA: Diagnosis not present

## 2019-07-14 LAB — LIPID PANEL
Chol/HDL Ratio: 2.4 ratio (ref 0.0–4.4)
Cholesterol, Total: 118 mg/dL (ref 100–199)
HDL: 50 mg/dL (ref >39)
LDL Calculated: 32 mg/dL (ref 0–99)
Triglycerides: 182 mg/dL — ABNORMAL HIGH (ref 0–149)
VLDL Cholesterol Cal: 36 mg/dL (ref 5–40)

## 2019-07-14 LAB — HEPATIC FUNCTION PANEL
ALT: 7 IU/L (ref 0–32)
AST: 21 IU/L (ref 0–40)
Albumin: 4.3 g/dL (ref 3.6–4.6)
Alkaline Phosphatase: 56 IU/L (ref 39–117)
Bilirubin Total: 0.3 mg/dL (ref 0.0–1.2)
Bilirubin, Direct: 0.14 mg/dL (ref 0.00–0.40)
Total Protein: 6.1 g/dL (ref 6.0–8.5)

## 2019-07-15 ENCOUNTER — Telehealth: Payer: Self-pay

## 2019-07-15 NOTE — Telephone Encounter (Signed)
Notes recorded by Frederik Schmidt, RN on 07/15/2019 at 12:20 PM EDT  [id number] 8/5  ------

## 2019-07-15 NOTE — Telephone Encounter (Signed)
-----   Message from [name], MD sent at 07/14/2019  6:21 PM EDT ----- Elevated triglycerides - please have patient restrict fats and sugars and less carbs. Repeat in 3 months

## 2019-07-16 ENCOUNTER — Telehealth: Payer: Self-pay

## 2019-07-16 DIAGNOSIS — E781 Pure hyperglyceridemia: Secondary | ICD-10-CM

## 2019-07-16 NOTE — Telephone Encounter (Signed)
Notes recorded by Frederik Schmidt, RN on 07/16/2019 at 1:13 PM EDT  The patient has been notified of the result and verbalized understanding. All questions (if any) were answered.  Frederik Schmidt, RN 07/16/2019 1:12 PM

## 2019-07-16 NOTE — Telephone Encounter (Signed)
-----   Message from [name], MD sent at 07/14/2019  6:21 PM EDT ----- Elevated triglycerides - please have patient restrict fats and sugars and less carbs. Repeat in 3 months

## 2019-07-21 DIAGNOSIS — F331 Major depressive disorder, recurrent, moderate: Secondary | ICD-10-CM | POA: Diagnosis not present

## 2019-07-21 DIAGNOSIS — D225 Melanocytic nevi of trunk: Secondary | ICD-10-CM | POA: Diagnosis not present

## 2019-07-21 DIAGNOSIS — L578 Other skin changes due to chronic exposure to nonionizing radiation: Secondary | ICD-10-CM | POA: Diagnosis not present

## 2019-07-21 DIAGNOSIS — L821 Other seborrheic keratosis: Secondary | ICD-10-CM | POA: Diagnosis not present

## 2019-07-21 DIAGNOSIS — D1801 Hemangioma of skin and subcutaneous tissue: Secondary | ICD-10-CM | POA: Diagnosis not present

## 2019-07-21 NOTE — Telephone Encounter (Addendum)
Received call from [facility] AP RN stating the patient is having more hallucinations, "severe" anxiety, and confusion. She will ask the [facility] if she will increase Zoloft to 100 mg daily. Patient has been on 50 mg for 4 weeks. Tye [place] stated she has been stumbling since middle dose of carb/levo was stopped. Cathy wanted Dr Leta Baptist to be aware, wasn't sure if the patient needs to be seen for follow up or her medication dose reduced. I advised will let Dr Leta Baptist know of her report. She also stated husband would like to be called so he can provide information.  I called Saralyn Pilar who stated they want to know effects on her Parkinson's if dr reduces medication doses at all. He had questions about pramipexole and carb/levo and "if they are working together".  Her walking is getting more difficult, so if reducing meds will worsen that they don't want dose reduced. If reducing doses will make her tremors worse, they don't want that either. He said their 2 main concerns are her anxiety and hallucinations which cause more anxiety. I advised will discuss with Dr Stephannie Li and let him know his recommendations. Saralyn Pilar verbalized understanding, appreciation.

## 2019-07-22 NOTE — Telephone Encounter (Signed)
Ok to slightly reduce carb/levo to reduce hallucinations. -VRP

## 2019-07-22 NOTE — Telephone Encounter (Signed)
LVM advising Dr Leta Baptist stated they can reduce carb/levo slightly to reduce hallucinations. Left number for any questions.

## 2019-07-22 NOTE — Telephone Encounter (Addendum)
Received call back from husband asking for clarification on which medication to reduce, carb/levo or mirapex.  I advised Dr Leta Baptist stated to slightly reduce carb/levo. He said she is currently taking total of 4 tabs daily; he will reduce to three tabs daily for a week. I advised he call for any questions, problems. Saralyn Pilar verbalized understanding, appreciation.

## 2019-07-23 ENCOUNTER — Non-Acute Institutional Stay: Payer: Medicare Other | Admitting: Nurse Practitioner

## 2019-07-23 ENCOUNTER — Other Ambulatory Visit: Payer: Self-pay

## 2019-07-23 DIAGNOSIS — M549 Dorsalgia, unspecified: Secondary | ICD-10-CM | POA: Diagnosis not present

## 2019-07-23 DIAGNOSIS — R410 Disorientation, unspecified: Secondary | ICD-10-CM

## 2019-07-23 DIAGNOSIS — E039 Hypothyroidism, unspecified: Secondary | ICD-10-CM | POA: Diagnosis not present

## 2019-07-23 DIAGNOSIS — F418 Other specified anxiety disorders: Secondary | ICD-10-CM

## 2019-07-23 DIAGNOSIS — G8929 Other chronic pain: Secondary | ICD-10-CM

## 2019-07-23 DIAGNOSIS — G20 Parkinson's disease: Secondary | ICD-10-CM

## 2019-07-23 DIAGNOSIS — R441 Visual hallucinations: Secondary | ICD-10-CM

## 2019-07-23 NOTE — Progress Notes (Signed)
Location:   clinic [place]   Place of Service:   clinic [place] Provider: Marlana Latus NP  Code Status: DNR Goals of Care: [place] 04/23/2019  Does Patient Have a Medical Advance Directive? Yes  Type of Advance Directive Out of facility DNR (pink MOST or yellow form)  Does patient want to make changes to medical advance directive? No - Patient declined  Copy of [place] in Chart? -  Would patient like information on creating a medical advance directive? No - Patient declined  Pre-existing out of facility DNR order (yellow form or pink MOST form) Yellow form placed in chart (order not valid for inpatient use)     No chief complaint on file.   HPI: Patient is a 85 y.o. female seen today for an acute visit for visual hallucination, anxiety related hallucination, progressing in the past a few weeks, failed Sertraline-psych consult, [employer] recommended to taper off Sertraline, on Cymbalta for mood. Hx of parkinson's disease, underwent Neurology evaluation, on tapering down dose of Sinemet, taking Mirapex 69m bid, Sinemet 25/1029mII bid.  Hx of Hypothyroidism, taking Levothyroxine 5093mqd, no recent TSH, scheduled 04/09/19-not done. Hx of lower back pain,  stable on Gabapentin 100m73m, Tylenol 650mg76m   Past Medical History:  Diagnosis Date  . Anemia   . Anxiety   . Aortic stenosis    moderate by echo 2019  . Arthritis   . Broken arm    right   . Chronic diastolic CHF (congestive heart failure) (HCC) [place] Family history of adverse reaction to anesthesia    pt. states sister vomits  . Fibromyalgia   . Fracture of thumb 09/01/2015   right  . Fracture, foot 09/01/2015   right  . GERD (gastroesophageal reflux disease)   . H/O hiatal hernia   . History of bronchitis   . History of kidney stones   . Hypercholesterolemia   . Hypertension    dr t turner  . Hypothyroidism   . Neuropathy   . Osteoporosis   . Parkinson disease (HCC) [place] PVD  (peripheral vascular disease) (HCC)    99% stenosis of left anteiror tibial artery, mod stenosis of left distal SFA and popliteal artery followed by Dr. FieldOneida AlarBBB    noted on EKG 2018  . Restless leg   . Shortness of breath    with exertion - chronic   . Sjogren's disease (HCC) [place] SOB (shortness of breath)    chronic due to diastolic dysfunction, deconditioning, obesity  . Tremor     Past Surgical History:  Procedure Laterality Date  . ABDOMINAL HYSTERECTOMY    . BACK SURGERY     4 back surgeries,   lumbar fusion  . CARDIAC CATHETERIZATION  2006   normal  . CYSTOSCOPY/URETEROSCOPY/HOLMIUM LASER/STENT PLACEMENT Left 01/06/2019   Procedure: CYSTOSCOPY/RETROGRADE/URETEROSCOPY/HOLMIUM LASER/BASKET RETRIEVAL/STENT PLACEMENT;  Surgeon: WinteCeasar Mons  Location: [facility];  Service: Urology;  Laterality: Left;  . EYE SURGERY Bilateral    cateracts  . falls     variious fall, broken wrist,and toes  . FRACTURE SURGERY Right April 2016   Wrist, Pt. fell  . kidney stone removal Left 01/20/2019  . RIGHT/LEFT HEART CATH AND CORONARY ANGIOGRAPHY N/A 02/05/2018   Procedure: RIGHT/LEFT HEART CATH AND CORONARY ANGIOGRAPHY;  Surgeon: KellyTroy Sine  [facility];  Service: Cardiovascular;  Laterality: N/A;  . SPINAL CORD STIMULATOR INSERTION N/A  09/09/2015   Procedure: LUMBAR SPINAL CORD STIMULATOR INSERTION;  Surgeon: Clydell Hakim, MD;  Location: [place] [facility];  Service: Neurosurgery;  Laterality: N/A;  LUMBAR SPINAL CORD STIMULATOR INSERTION  . SPINE SURGERY  April 2013   Back X's 4  . TOTAL HIP ARTHROPLASTY     right  . WRIST FRACTURE SURGERY Bilateral     Allergies  Allergen Reactions  . Lactose Other (See Comments)    abd pain, lactose intolerant  . Morphine Other (See Comments)  . Sulfa Antibiotics Other (See Comments)    Headache, very sick  . Codeine Other (See Comments)    Reaction:  Headaches and nightmares   . Lactose Intolerance (Gi)  Nausea And Vomiting  . Latex Rash  . Lyrica [Pregabalin] Swelling and Other (See Comments)    Reaction:  Leg swelling  . Other Other (See Comments)    Pt states that pain medications give her nightmares.    . Plaquenil [Hydroxychloroquine Sulfate] Other (See Comments)    Reaction:  GI upset   . Reglan [Metoclopramide] Other (See Comments)    Reaction:  GI upset   . Requip [Ropinirole Hcl] Other (See Comments)    Reaction:  GI upset   . Septra [Sulfamethoxazole-Trimethoprim] Nausea And Vomiting  . Shellfish Allergy Nausea And Vomiting    Allergies as of 07/23/2019      Reactions   Lactose Other (See Comments)   abd pain, lactose intolerant   Morphine Other (See Comments)   Sulfa Antibiotics Other (See Comments)   Headache, very sick   Codeine Other (See Comments)   Reaction:  Headaches and nightmares    Lactose Intolerance (gi) Nausea And Vomiting   Latex Rash   Lyrica [pregabalin] Swelling, Other (See Comments)   Reaction:  Leg swelling   Other Other (See Comments)   Pt states that pain medications give her nightmares.     Plaquenil [hydroxychloroquine Sulfate] Other (See Comments)   Reaction:  GI upset    Reglan [metoclopramide] Other (See Comments)   Reaction:  GI upset    Requip [ropinirole Hcl] Other (See Comments)   Reaction:  GI upset    Septra [sulfamethoxazole-trimethoprim] Nausea And Vomiting   Shellfish Allergy Nausea And Vomiting      Medication List       Accurate as of July 23, 2019 11:59 PM. If you have any questions, ask your nurse or doctor.        acetaminophen 325 MG tablet Commonly known as: TYLENOL Take 650 mg by mouth daily.   AeroChamber MV inhaler Use as instructed   albuterol 108 (90 Base) MCG/ACT inhaler Commonly known as: VENTOLIN HFA Inhale 2 puffs into the lungs every 6 (six) hours as needed for wheezing or shortness of breath.   aspirin EC 81 MG tablet Take 81 mg by mouth at bedtime.   CALCIUM+D3 PO Take 15 mLs by mouth  daily.   carbidopa-levodopa 25-100 MG tablet Commonly known as: SINEMET IR Take 2 tablets by mouth 2 (two) times a day.   denosumab 60 MG/ML Soln injection Commonly known as: PROLIA Inject 60 mg into the skin every 6 (six) months. Administer in upper arm, thigh, or abdomen   Dexilant 60 MG capsule Generic drug: dexlansoprazole Take 60 mg by mouth daily.   docusate sodium 100 MG capsule Commonly known as: COLACE Take 100 mg by mouth at bedtime.   DULoxetine 20 MG capsule Commonly known as: CYMBALTA TAKE (1) CAPSULE DAILY.   eucerin cream Apply 1  application topically daily.   ezetimibe 10 MG tablet Commonly known as: ZETIA TAKE 1 TABLET ONCE DAILY.   FERROUS SULFATE PO Take 15 mLs by mouth daily.   gabapentin 100 MG capsule Commonly known as: NEURONTIN Take 1 capsule (100 mg total) by mouth at bedtime.   levothyroxine 50 MCG tablet Commonly known as: SYNTHROID Take 50 mcg by mouth daily before breakfast.   lovastatin 40 MG tablet Commonly known as: MEVACOR TAKE 1 TABLET ONCE DAILY WITH A MEAL.   metoprolol tartrate 25 MG tablet Commonly known as: LOPRESSOR TAKE 1 TABLET ONCE DAILY.   multivitamin with minerals Tabs tablet Take 1 tablet by mouth daily.   nystatin powder Commonly known as: MYCOSTATIN/NYSTOP Apply topically 2 (two) times daily as needed. What changed:   how much to take  reasons to take this   pramipexole 1 MG tablet Commonly known as: MIRAPEX Take 1 tablet (1 mg total) by mouth 2 (two) times a day.   PRESERVISION AREDS PO Take by mouth 2 (two) times a day.   rosuvastatin 20 MG tablet Commonly known as: CRESTOR Take 1 tablet (20 mg total) by mouth daily.   sertraline 50 MG tablet Commonly known as: ZOLOFT Take 1 tablet (50 mg total) by mouth daily.   Systane 0.4-0.3 % Soln Generic drug: Polyethyl Glycol-Propyl Glycol Place 1 drop into both eyes 2 (two) times daily as needed (dry/irritated eyes.).   trimethoprim 100 MG tablet  Commonly known as: TRIMPEX Take 100 mg by mouth at bedtime.   Vitamin D 50 MCG (2000 UT) Caps Take 2,000 Units by mouth daily.      ROS was provided with assistance of the patient's husband and staff Review of Systems:  Review of Systems  Constitutional: Positive for unexpected weight change. Negative for activity change, appetite change, chills, diaphoresis, fatigue and fever.       Weight loss #6Ibs in the past 6 months  HENT: Positive for hearing loss. Negative for congestion and voice change.   Respiratory: Positive for shortness of breath. Negative for cough and wheezing.        DOE  Cardiovascular: Positive for leg swelling. Negative for chest pain and palpitations.  Gastrointestinal: Negative for abdominal distention, abdominal pain, constipation, diarrhea, nausea and vomiting.  Genitourinary: Negative for difficulty urinating, dysuria and urgency.  Musculoskeletal: Positive for back pain and gait problem.  Skin: Negative for color change and pallor.  Neurological: Negative for dizziness, speech difficulty, weakness and headaches.       Memory lapses  Psychiatric/Behavioral: Positive for behavioral problems, confusion, dysphoric mood and hallucinations. Negative for agitation and sleep disturbance. The patient is nervous/anxious.     Health Maintenance  Topic Date Due  . INFLUENZA VACCINE  07/11/2019  . TETANUS/TDAP  08/31/2025  . DEXA SCAN  Completed  . PNA vac Low Risk Adult  Completed    Physical Exam: Vitals:   07/24/19 1421  BP: 118/64  Pulse: 72  Resp: 20  Temp: (!) 97.3 F (36.3 C)  SpO2: 96%  Weight: 150 lb 12.8 oz (68.4 kg)  Height: _0  (1.575 m)   Body mass index is 27.58 kg/m. Physical Exam Constitutional:      General: She is not in acute distress.    Appearance: Normal appearance. She is obese. She is not ill-appearing, toxic-appearing or diaphoretic.  HENT:     Head: Normocephalic and atraumatic.     Nose: Nose normal.     Mouth/Throat:      Mouth: Mucous  membranes are moist.  Eyes:     Extraocular Movements: Extraocular movements intact.     Conjunctiva/sclera: Conjunctivae normal.     Pupils: Pupils are equal, round, and reactive to light.  Neck:     Musculoskeletal: Neck supple.  Cardiovascular:     Rate and Rhythm: Normal rate.     Heart sounds: Murmur present.  Pulmonary:     Breath sounds: No wheezing, rhonchi or rales.  Abdominal:     General: Bowel sounds are normal. There is no distension.     Palpations: Abdomen is soft.     Tenderness: There is no abdominal tenderness. There is no right CVA tenderness, guarding or rebound.  Musculoskeletal:     Right lower leg: Edema present.     Left lower leg: Edema present.     Comments: Trace edema BLE. Ambulates with walker  Skin:    General: Skin is warm and dry.  Neurological:     General: No focal deficit present.     Mental Status: She is alert. Mental status is at baseline.     Cranial Nerves: No cranial nerve deficit.     Motor: Weakness present.     Coordination: Coordination abnormal.     Gait: Gait abnormal.     Comments: Oriented to person and place  Psychiatric:     Comments: Reported visual hallucination-setting up table for coming guests for meals not seeing by her husband. She thinks her husband Saralyn Pilar is at meeting, the one present with her is Patrick's brother wearing Patrick's name badge upon my visit.      Labs reviewed: Basic Metabolic Panel: Recent Labs    01/02/19 1311 05/26/19 0906  NA 138 143  K 4.7 4.1  CL 108 110  CO2 21* 24  GLUCOSE 118* 97  BUN 14 13  CREATININE 1.34* 0.65  CALCIUM 9.5 9.0   Liver Function Tests: Recent Labs    01/02/19 1935 07/14/19 0811  AST 24 21  ALT 13 7  ALKPHOS 56 56  BILITOT 0.8 0.3  PROT 6.4* 6.1  ALBUMIN 3.5 4.3   Recent Labs    01/02/19 1935  LIPASE 26   No results for input(s): AMMONIA in the last 8760 hours. CBC: Recent Labs    01/02/19 1311 05/26/19 0906  WBC 10.8* 7.4   NEUTROABS  --  4,862  HGB 9.6* 9.4*  HCT 31.4* 29.8*  MCV 92.1 89.5  PLT 288 302   Lipid Panel: Recent Labs    07/14/19 0811  CHOL 118  HDL 50  LDLCALC 32  TRIG 182*  CHOLHDL 2.4   No results found for: HGBA1C  Procedures since last visit: Dg Foot Complete Left  Result Date: 07/07/2019 Please see detailed radiograph report in office note.  Dg Foot Complete Right  Result Date: 07/07/2019 Please see detailed radiograph report in office note.   Assessment/Plan Hypothyroidism Continue Levothyroxine 86mg qd, update TSH  Parkinson's disease Continue f/u Neurology, continue Mirapex, Sinemet. Visual hallucination: may consider Nuplazid if the patient desires or have her Neurology to address it.   Hallucinations, visual Parkinson's related issue vs intracranial process? F/u Neurology. May consider trial of Nuplazid if the patient desires. Update CBC/diff, CMP/eGFR  Anxiety about health Mostly related to hallucination, failed Sertraline, will reduce taper [facility] May consider managing visual hallucination to see if her anxiety subsides. Continue Cymbalta 273mqd  Chronic back pain Stable, continue  Gabapentin 10042md, Tylenol 650m65m.  Confusion She thinks her husband Saralyn Pilar is at meeting, the one present with her is Patrick's brother wearing Patrick's name badge upon my visit.      Labs/tests ordered:  TSH  Plan of care reviewed with the patient, the patient's husband, and charge nurse. The patient is admitted to [facility] for observation,  medical management,  safety/care needs.   Next appt:  10/06/2019

## 2019-07-23 NOTE — Assessment & Plan Note (Signed)
Continue f/u Neurology, continue Mirapex, Sinemet. Visual hallucination: may consider Nuplazid if the patient desires or have her Neurology to address it.

## 2019-07-23 NOTE — Assessment & Plan Note (Addendum)
Mostly related to hallucination, failed Sertraline, will reduce taper Sertraline off per Psych consult, [facility]. May consider managing visual hallucination to see if her anxiety subsides. Continue Cymbalta 20mg  qd

## 2019-07-23 NOTE — Assessment & Plan Note (Signed)
Continue Levothyroxine 32mcg qd, update TSH

## 2019-07-23 NOTE — Assessment & Plan Note (Addendum)
Parkinson's related issue vs intracranial process? F/u Neurology. May consider trial of Nuplazid if the patient desires. Update CBC/diff, CMP/eGFR

## 2019-07-24 ENCOUNTER — Non-Acute Institutional Stay: Payer: Medicare Other | Admitting: Internal Medicine

## 2019-07-24 ENCOUNTER — Encounter: Payer: Self-pay | Admitting: Internal Medicine

## 2019-07-24 ENCOUNTER — Encounter: Payer: Self-pay | Admitting: Nurse Practitioner

## 2019-07-24 ENCOUNTER — Other Ambulatory Visit: Payer: Self-pay | Admitting: Internal Medicine

## 2019-07-24 DIAGNOSIS — R410 Disorientation, unspecified: Secondary | ICD-10-CM | POA: Diagnosis not present

## 2019-07-24 DIAGNOSIS — E039 Hypothyroidism, unspecified: Secondary | ICD-10-CM | POA: Diagnosis not present

## 2019-07-24 DIAGNOSIS — M810 Age-related osteoporosis without current pathological fracture: Secondary | ICD-10-CM

## 2019-07-24 DIAGNOSIS — F329 Major depressive disorder, single episode, unspecified: Secondary | ICD-10-CM | POA: Diagnosis not present

## 2019-07-24 DIAGNOSIS — R441 Visual hallucinations: Secondary | ICD-10-CM | POA: Diagnosis not present

## 2019-07-24 DIAGNOSIS — K219 Gastro-esophageal reflux disease without esophagitis: Secondary | ICD-10-CM

## 2019-07-24 DIAGNOSIS — G20 Parkinson's disease: Secondary | ICD-10-CM | POA: Diagnosis not present

## 2019-07-24 DIAGNOSIS — I10 Essential (primary) hypertension: Secondary | ICD-10-CM | POA: Diagnosis not present

## 2019-07-24 DIAGNOSIS — I739 Peripheral vascular disease, unspecified: Secondary | ICD-10-CM | POA: Diagnosis not present

## 2019-07-24 DIAGNOSIS — F32A Depression, unspecified: Secondary | ICD-10-CM

## 2019-07-24 DIAGNOSIS — I5032 Chronic diastolic (congestive) heart failure: Secondary | ICD-10-CM | POA: Diagnosis not present

## 2019-07-24 DIAGNOSIS — R443 Hallucinations, unspecified: Secondary | ICD-10-CM | POA: Diagnosis not present

## 2019-07-24 LAB — HEPATIC FUNCTION PANEL
ALT: 5 — AB (ref 7–35)
AST: 43 — AB (ref 13–35)
Alkaline Phosphatase: 0.4 — AB (ref 25–125)
Bilirubin, Total: 1.8

## 2019-07-24 LAB — BASIC METABOLIC PANEL
BUN: 12 (ref 4–21)
Creatinine: 0.7 (ref 0.5–1.1)
Glucose: 90
Potassium: 4.3 (ref 3.4–5.3)
Sodium: 142 (ref 137–147)

## 2019-07-24 MED ORDER — QUETIAPINE FUMARATE 25 MG PO TABS: ORAL_TABLET | ORAL | 0 refills | Status: DC

## 2019-07-24 NOTE — Progress Notes (Addendum)
Location:  [facility] [place] Room Number: [phone] Place of Service:  ALF [phone]) Provider:  [name]  MD  [name], MD  Patient Care Team: [name], MD as PCP - General (Internal Medicine) [name], MD as PCP - Cardiology (Cardiology) [name], MD (Neurosurgery) [name], MD (Neurology) [name], MD as Consulting Physician (Gastroenterology) [name], MD as Consulting Physician (Urology) [name], Man X, NP as Nurse Practitioner (Internal Medicine)  Extended Emergency Contact Information Primary Emergency Contact: [facility] Address: [address]          [name], [place] [address] [name] of [place] Phone: [phone] Mobile Phone: [phone] Relation: Spouse Secondary Emergency Contact: [name] Mobile Phone: [phone] Relation: Daughter Preferred language: [name] Interpreter needed? No  Code Status:  DNR Goals of care: Advanced Directive information Advanced Directives 04/23/2019  Does Patient Have a Medical Advance Directive? Yes  Type of Advance Directive Out of facility DNR (pink MOST or yellow form)  Does patient want to make changes to medical advance directive? No - Patient declined  Copy of [place] in Chart? -  Would patient like information on creating a medical advance directive? No - Patient declined  Pre-existing out of facility DNR order (yellow form or pink MOST form) Yellow form placed in chart (order not valid for inpatient use)     Chief Complaint  Patient presents with   Acute Visit    C/o Visual Hallucinations    HPI:  Pt is a 85 y.o. female seen today for an acute visit for worsening Visual Hallucinations Patient has a history of Parkinson disease with visual hallucinations, peripheral neuropathy, hypothyroid,  GERD, osteoporosis, Hyperlipidemia, Gout, Dyspnea on Exertion ,   Patient has history of visual hallucinations with  her Parkinson disease and follows with neurology.    She came to the nurse practitioner's office with her husband states that her hallucinations were getting worse.  He was unable to handle it  She was recently also started on Zoloft per Psychology and their was some concern for Hyponatremia.  She was admitted in the daily unit overnight to have some stat labs done to rule out the metabolic reason for her hallucinations. Overnight patient did well. This morning patient is back to her baseline.  She walks with a walker but has a very slow and shuffled gait.  Is alert and oriented.  Is very much aware of her hallucinations. She did not have any fever cough chest pain    Past Medical History:  Diagnosis Date   Anemia    Anxiety    Aortic stenosis    moderate by echo 2019   Arthritis    Broken arm    right    Chronic diastolic CHF (congestive heart failure) ([place])    Family history of adverse reaction to anesthesia    pt. states sister vomits   Fibromyalgia    Fracture of thumb 09/01/2015   right   Fracture, foot 09/01/2015   right   GERD (gastroesophageal reflux disease)    H/O hiatal hernia    History of bronchitis    History of kidney stones    Hypercholesterolemia    Hypertension    dr t turner   Hypothyroidism    Neuropathy    Osteoporosis    Parkinson disease ([place])    PVD (peripheral vascular disease) (H[facility])    99% stenosis of left anteiror tibial artery, mod stenosis of left  distal SFA and popliteal artery followed by [name]   RBBB    noted on EKG 2018   Restless leg    Shortness of breath    with exertion - chronic    Sjogren's disease ([place])    SOB (shortness of breath)    chronic due to diastolic dysfunction, deconditioning, obesity   Tremor    Past Surgical History:  Procedure Laterality Date   ABDOMINAL HYSTERECTOMY     BACK SURGERY     4 back surgeries,   lumbar fusion   CARDIAC CATHETERIZATION  2006   normal    CYSTOSCOPY/URETEROSCOPY/HOLMIUM LASER/STENT PLACEMENT Left 01/06/2019   Procedure: CYSTOSCOPY/RETROGRADE/URETEROSCOPY/HOLMIUM LASER/BASKET RETRIEVAL/STENT PLACEMENT;  Surgeon: Ceasar Mons, MD;  Location: [facility];  Service: Urology;  Laterality: Left;   EYE SURGERY Bilateral    cateracts   falls     variious fall, broken wrist,and toes   FRACTURE SURGERY Right April 2016   Wrist, Pt. fell   kidney stone removal Left 01/20/2019   RIGHT/LEFT HEART CATH AND CORONARY ANGIOGRAPHY N/A 02/05/2018   Procedure: RIGHT/LEFT HEART CATH AND CORONARY ANGIOGRAPHY;  Surgeon: Troy Sine, MD;  Location: [facility];  Service: Cardiovascular;  Laterality: N/A;   SPINAL CORD STIMULATOR INSERTION N/A 09/09/2015   Procedure: LUMBAR SPINAL CORD STIMULATOR INSERTION;  Surgeon: Clydell Hakim, MD;  Location: [facility];  Service: Neurosurgery;  Laterality: N/A;  LUMBAR SPINAL CORD STIMULATOR INSERTION   SPINE SURGERY  April 2013   Back X's 4   TOTAL HIP ARTHROPLASTY     right   WRIST FRACTURE SURGERY Bilateral     Allergies  Allergen Reactions   Lactose Other (See Comments)    abd pain, lactose intolerant   Morphine Other (See Comments)   Sulfa Antibiotics Other (See Comments)    Headache, very sick   Codeine Other (See Comments)    Reaction:  Headaches and nightmares    Lactose Intolerance (Gi) Nausea And Vomiting   Latex Rash   Lyrica [Pregabalin] Swelling and Other (See Comments)    Reaction:  Leg swelling   Other Other (See Comments)    Pt states that pain medications give her nightmares.     Plaquenil [Hydroxychloroquine Sulfate] Other (See Comments)    Reaction:  GI upset    Reglan [Metoclopramide] Other (See Comments)    Reaction:  GI upset    Requip [Ropinirole Hcl] Other (See Comments)    Reaction:  GI upset    Septra [Sulfamethoxazole-Trimethoprim] Nausea And Vomiting   Shellfish Allergy Nausea And Vomiting    Outpatient Encounter Medications  as of 07/24/2019  Medication Sig   acetaminophen (TYLENOL) 325 MG tablet Take 650 mg by mouth daily.    albuterol (PROVENTIL HFA;VENTOLIN HFA) 108 (90 Base) MCG/ACT inhaler Inhale 2 puffs into the lungs every 6 (six) hours as needed for wheezing or shortness of breath.   aspirin EC 81 MG tablet Take 81 mg by mouth at bedtime.    Calcium Carb-Cholecalciferol (CALCIUM+D3 PO) Take 15 mLs by mouth daily.   carbidopa-levodopa (SINEMET IR) 25-100 MG tablet Take 2 tablets by mouth at bedtime. Take 1 tablet daily   Cholecalciferol (VITAMIN D) 2000 units CAPS Take 2,000 Units by mouth daily.   denosumab (PROLIA) 60 MG/ML SOLN injection Inject 60 mg into the skin every 6 (six) months. Administer in upper arm, thigh, or abdomen   dexlansoprazole (DEXILANT) 60 MG capsule Take 60 mg by mouth daily.   docusate sodium (COLACE) 100 MG  capsule Take 100 mg by mouth at bedtime.   DULoxetine (CYMBALTA) 20 MG capsule TAKE (1) CAPSULE DAILY.   ezetimibe (ZETIA) 10 MG tablet TAKE 1 TABLET ONCE DAILY.   gabapentin (NEURONTIN) 100 MG capsule Take 1 capsule (100 mg total) by mouth at bedtime.   levothyroxine (SYNTHROID, LEVOTHROID) 50 MCG tablet Take 50 mcg by mouth daily before breakfast.    lovastatin (MEVACOR) 40 MG tablet TAKE 1 TABLET ONCE DAILY WITH A MEAL.   metoprolol tartrate (LOPRESSOR) 25 MG tablet TAKE 1 TABLET ONCE DAILY.   Multiple Vitamin (MULTIVITAMIN WITH MINERALS) TABS tablet Take 1 tablet by mouth daily.   nystatin (MYCOSTATIN/NYSTOP) powder Apply topically 2 (two) times daily as needed. (Patient taking differently: Apply 1 g topically 2 (two) times daily as needed (for affected areas under breast). )   Polyethyl Glycol-Propyl Glycol (SYSTANE) 0.4-0.3 % SOLN Place 1 drop into both eyes 2 (two) times daily as needed (dry/irritated eyes.).    Polyethyl Glycol-Propyl Glycol (SYSTANE) 0.4-0.3 % SOLN Apply 1 drop to eye 2 (two) times daily as needed.   pramipexole (MIRAPEX) 1 MG tablet  Take 1 mg by mouth 2 (two) times daily.   rosuvastatin (CRESTOR) 20 MG tablet Take 1 tablet (20 mg total) by mouth daily.   sertraline (ZOLOFT) 25 MG tablet Take 25 mg by mouth daily.   Skin Protectants, Misc. (EUCERIN) cream Apply 1 application topically daily.    trimethoprim (TRIMPEX) 100 MG tablet Take 100 mg by mouth at bedtime.    [DISCONTINUED] Polyethyl Glycol-Propyl Glycol (SYSTANE) 0.4-0.3 % SOLN Apply to eye.   carbidopa-levodopa (SINEMET IR) 25-100 MG tablet Take 2 tablets by mouth 2 (two) times a day.   [DISCONTINUED] FERROUS SULFATE PO Take 15 mLs by mouth daily.    [DISCONTINUED] Multiple Vitamins-Minerals (PRESERVISION AREDS PO) Take by mouth 2 (two) times a day.   [DISCONTINUED] pramipexole (MIRAPEX) 1 MG tablet Take 1 tablet (1 mg total) by mouth 2 (two) times a day.   [DISCONTINUED] sertraline (ZOLOFT) 50 MG tablet Take 1 tablet (50 mg total) by mouth daily.   [DISCONTINUED] Spacer/Aero-Holding Chambers (AEROCHAMBER MV) inhaler Use as instructed   No facility-administered encounter medications on file as of 07/24/2019.     Review of Systems  Constitutional: Positive for activity change.  HENT: Negative.   Respiratory: Positive for shortness of breath.   Cardiovascular: Negative.   Gastrointestinal: Negative.   Genitourinary: Negative.   Musculoskeletal: Negative.   Neurological: Positive for weakness.  Psychiatric/Behavioral: Positive for dysphoric mood and hallucinations.  All other systems reviewed and are negative.   Immunization History  Administered Date(s) Administered   Influenza Split 09/09/2013   Influenza, High Dose Seasonal PF 09/12/2018   Influenza,inj,Quad PF,6+ Mos 09/11/2016   Pneumococcal Conjugate-13 04/15/2014   Pneumococcal Polysaccharide-23 09/28/2005   Tdap 09/01/2015   Zoster Recombinat (Shingrix) 06/11/2018, 09/10/2018   Pertinent  Health Maintenance Due  Topic Date Due   INFLUENZA VACCINE  07/11/2019   DEXA SCAN   Completed   PNA vac Low Risk Adult  Completed   Fall Risk  04/23/2019 04/09/2019 10/29/2018 10/27/2018 10/09/2018  Falls in the past year? 0 0 0 1 Yes  Comment - - fell June 2019 - -  Number falls in past yr: 0 0 - 0 1  Injury with Fall? 0 0 - 1 Yes  Comment - - - - -  Risk for fall due to : - - - - -  Risk for fall due to: Comment - - - - -  Functional Status Survey:    Vitals:   07/24/19 1213  BP: (!) 142/70  Pulse: 76  Resp: 18  Temp: 98.2 F (36.8 C)  SpO2: 96%  Weight: 150 lb 12.8 oz (68.4 kg)  Height: 4\' 11"  (1.499 m)   Body mass index is 30.46 kg/m. Physical Exam Vitals signs reviewed.  Constitutional:      Appearance: Normal appearance.  HENT:     Head: Normocephalic.     Nose: Nose normal.     Mouth/Throat:     Mouth: Mucous membranes are moist.     Pharynx: Oropharynx is clear.  Eyes:     Pupils: Pupils are equal, round, and reactive to light.  Neck:     Musculoskeletal: Neck supple.  Cardiovascular:     Rate and Rhythm: Normal rate.     Heart sounds: Murmur present.  Pulmonary:     Effort: Pulmonary effort is normal. No respiratory distress.     Breath sounds: Normal breath sounds. No wheezing or rales.  Abdominal:     General: Abdomen is flat. Bowel sounds are normal.     Palpations: Abdomen is soft.  Musculoskeletal:        General: Swelling present.  Skin:    General: Skin is warm and dry.  Neurological:     General: No focal deficit present.     Mental Status: She is alert and oriented to person, place, and time.     Comments: Does have shuffle gait and was little Unsteady . Needed help to stand up  Psychiatric:        Mood and Affect: Mood normal.        Thought Content: Thought content normal.     Labs reviewed: Recent Labs    01/02/19 1311 05/26/19 0906  NA 138 143  K 4.7 4.1  CL 108 110  CO2 21* 24  GLUCOSE 118* 97  BUN 14 13  CREATININE 1.34* 0.65  CALCIUM 9.5 9.0   Recent Labs    01/02/19 1935 07/14/19 0811  AST  24 21  ALT 13 7  ALKPHOS 56 56  BILITOT 0.8 0.3  PROT 6.4* 6.1  ALBUMIN 3.5 4.3   Recent Labs    01/02/19 1311 05/26/19 0906  WBC 10.8* 7.4  NEUTROABS  --  4,862  HGB 9.6* 9.4*  HCT 31.4* 29.8*  MCV 92.1 89.5  PLT 288 302   Lab Results  Component Value Date   TSH 1.77 07/01/2018   No results found for: HGBA1C Lab Results  Component Value Date   CHOL 118 07/14/2019   HDL 50 07/14/2019   LDLCALC 32 07/14/2019   TRIG 182 (H) 07/14/2019   CHOLHDL 2.4 07/14/2019    Significant Diagnostic Results in last 30 days:  Dg Foot Complete Left  Result Date: 07/07/2019 Please see detailed radiograph report in office note.  Dg Foot Complete Right  Result Date: 07/07/2019 Please see detailed radiograph report in office note.   Assessment/Plan Parkinson's disease ([place]) - Plan:  Follows with Neurology They have suggested to decrease her Sinemet from 4 tabs to 3 tabs a day Patient is also having some issues with her gait and Transfers. Her husband requiring lot of help They have agreed for PT/OT eval and treat  Hallucinations, visual - Plan:  Labs were normal D/W the Husband to use Seroquel 12.5 mg -25 mg Prn in the evening to help her with hallucinations  Essential hypertension - Plan:  Controlled on Lopressor  Depression, unspecified depression  type - Plan:  Was started on Zoloft But now she will taper it as she did not respond to it On Cymbata  Osteoporosis  - Plan:  Continue on Prolia and calcium   GERD On Dexilant Hyperlipidemia On Crestor        Family/ staff Communication: D/W her Husband   Labs/tests ordered:   Total time spent in this patient care encounter was  40_  minutes; greater than 50% of the visit spent counseling patient and staff, reviewing records , Labs and coordinating care for problems addressed at this encounter.

## 2019-07-24 NOTE — Assessment & Plan Note (Signed)
Stable, continue  Gabapentin 100mg  qd, Tylenol 650mg  qd.

## 2019-07-24 NOTE — Assessment & Plan Note (Signed)
She thinks her husband Saralyn Pilar is at meeting, the one present with her is Patrick's brother wearing Patrick's name badge upon my visit.

## 2019-07-27 ENCOUNTER — Ambulatory Visit (INDEPENDENT_AMBULATORY_CARE_PROVIDER_SITE_OTHER): Payer: Medicare Other | Admitting: Emergency Medicine

## 2019-07-27 ENCOUNTER — Encounter: Payer: Self-pay | Admitting: Emergency Medicine

## 2019-07-27 ENCOUNTER — Other Ambulatory Visit: Payer: Self-pay

## 2019-07-27 DIAGNOSIS — R0609 Other forms of dyspnea: Secondary | ICD-10-CM | POA: Diagnosis not present

## 2019-07-27 NOTE — Assessment & Plan Note (Signed)
Multifactorial dyspnea in the setting of obesity, overall debilitation from a Parkinson disease, diastolic dysfunction with some component of volume overload (controlled).  We undertook a trial of albuterol to see if she would get benefit and she believes that she may have done so.  I do not see a downside to continuing as needed.  We will continue, follow her symptoms.  Please continue to use your albuterol 2 puffs if needed for shortness of breath, chest tightness. You can try using the albuterol about 10 minutes before planned exercise Continue your other medications as ordered, in particular your diuretic medications as managed by cardiology.  Follow with Dr Lamonte Sakai in 6 months or sooner if you have any problems

## 2019-07-27 NOTE — Progress Notes (Signed)
Subjective:    Patient ID: [name], female    DOB: 1931-06-16, 85 y.o.   MRN: [id number]  Shortness of Breath Pertinent negatives include no ear pain, fever, headaches, leg swelling, rash, rhinorrhea, sore throat, vomiting or wheezing.   85 yo never smoker, hx of Hiatal hernia, sjogren's, hypothyroidism, Parkinson's, PVD of LLE, diastolic dysfxn, allergies. Also with OA s/p hip replacement and multiple back surgeries. She has had longstanding exertional SOB that goes back for years. She has a significant 2nd hand tobacco exposure. She doesn't hear wheezing but she does have cough > a dry, non-productive. She is on Dexilant - controls her GERD sx.   Chemical nuclear stress test 2014 > normal.   ROV 05/25/14 -- follow up for dyspnea.  Underwent PFT today > normal AF with ?? Some evidence on FV loop for obstruction.  No new issues since last time.  OV to re-establish care 10/09/16 -- 85 yo obese never smoker with a hx HTN and dCHF, hiatal hernia / GERD, parkinson's, allergic rhinitis, PVD. She returns today for evaluation of dyspnea. She reports that she is comfortable at rest. She has tachypnea and inability to take a deep breath. This will happen when walking about 1/4 mile. This is quite similar to our previous visits, not necessary worse.  She does also have leg aching w exertion.  No cough, no wheeze, no chest tightness. No congestion. She does have GERD, adequately treated w dexilant. Her wt has been stable. PFT done 05/2014 were reviewed > spirometry shows normal airflows, possible curve of the flow volume loop to suggest mild obstruction. Do not see lung volumes or DLCO.  TTE from 05/23/16 > LVEF 50-55%, mild AS, diastolic dysfxn, normal RV, estimated PASP 60mmHg. She has an implanted pain pump for back and hip pain.   ROV 01/01/17 -- This follow-up visit for patient with a history of exertional dyspnea in the setting of obesity, restrictive disease from hiatal hernia, GERD, allergic  rhinitis, diastolic CHF. At her last visit we started albuterol to be used as needed for possible obstructive disease noted on pulmonary function testing from June 2017.  She has had a fall since last time, has a shuffling gait. Last time we did a trial of albuterol to see if she would benefit - she isn't sure that it helped her in any way.   Acute OV 01/15/19 -[name] is 72, never smoker.  She returns today for further evaluation of her exertional dyspnea.  I have not seen her in 2 years.  She has obesity, hiatal hernia with GERD, allergic rhinitis, hypertension with diastolic CHF, renal calculi with recent stent placement.  She had normal spirometry in June 2015, no PFT since. She had TTE performed 12/11/18 that shows diastolic dysfunction, normal LV fxn, moderate AS. She has significant dyspnea w exertion, can also happen some times with sitting at rest.  She can walk about 150 ft with a walker before stopping. No cough, no wheezing. Weight is stable but she does have LE edema, possibly worse over several months time.    ROV 07/27/2019 --[name] follows up today for her history of dyspnea in the absence of any clear obstructive lung disease.  She does have obesity and a hiatal hernia that could contribute to restriction.  She has moderate AS, diastolic dysfunction.  We undertook a trial of albuterol to see if she would get any clinical benefit.  She reports that she does believe that it has been helpful -  has used it with exertion, while walking w a walker.  She has been dealing with increased lower extremity edema with Cardiology. She has been having more difficulty with her Parkinson's, some delirium. No cough, feels an abnormality, possible swallowing obstruction w meals.    Review of Systems  Constitutional: Negative for fever and unexpected weight change.  HENT: Negative for congestion, dental problem, ear pain, nosebleeds, postnasal drip, rhinorrhea, sinus pressure, sneezing, sore throat and  trouble swallowing.   Eyes: Negative for redness and itching.  Respiratory: Positive for shortness of breath. Negative for cough, chest tightness and wheezing.   Cardiovascular: Negative for palpitations and leg swelling.  Gastrointestinal: Negative for abdominal distention, nausea and vomiting.  Genitourinary: Negative for dysuria.  Musculoskeletal: Positive for joint swelling.       Pain and stiffness  Skin: Negative for rash.  Neurological: Positive for weakness. Negative for headaches.  Hematological: Does not bruise/bleed easily.  Psychiatric/Behavioral: Negative for dysphoric mood. The patient is not nervous/anxious.    Past Medical History:  Diagnosis Date  . Anemia   . Anxiety   . Aortic stenosis    moderate by echo 2019  . Arthritis   . Broken arm    right   . Chronic diastolic CHF (congestive heart failure) ([facility])   . Family history of adverse reaction to anesthesia    pt. states sister vomits  . Fibromyalgia   . Fracture of thumb 09/01/2015   right  . Fracture, foot 09/01/2015   right  . GERD (gastroesophageal reflux disease)   . H/O hiatal hernia   . History of bronchitis   . History of kidney stones   . Hypercholesterolemia   . Hypertension    dr t turner  . Hypothyroidism   . Neuropathy   . Osteoporosis   . Parkinson disease ([name])   . PVD (peripheral vascular disease) ([facility])    99% stenosis of left anteiror tibial artery, mod stenosis of left distal SFA and popliteal artery followed by [name]  . RBBB    noted on EKG 2018  . Restless leg   . Shortness of breath    with exertion - chronic   . Sjogren's disease ([facility])   . SOB (shortness of breath)    chronic due to diastolic dysfunction, deconditioning, obesity  . Tremor      Family History  Problem Relation Age of Onset  . Heart attack Mother   . Restless legs syndrome Mother   . Heart failure Mother   . Heart disease Mother   . Hypertension Mother   . COPD Father      Social History    Socioeconomic History  . Marital status: Married    Spouse name: [name]  . Number of children: 2  . Years of education: HS  . Highest education level: Not on file  Occupational History    Comment: Homemaker  Social Needs  . Financial resource strain: Not hard at all  . Food insecurity    Worry: Never true    Inability: Never true  . Transportation needs    Medical: No    Non-medical: No  Tobacco Use  . Smoking status: Never Smoker  . Smokeless tobacco: Never Used  Substance and Sexual Activity  . Alcohol use: No    Alcohol/week: 0.0 standard drinks  . Drug use: No  . Sexual activity: Never  Lifestyle  . Physical activity    Days per week: 2 days    Minutes per  session: 30 min  . Stress: Only a little  Relationships  . Social connections    Talks on phone: More than three times a week    Gets together: More than three times a week    Attends religious service: More than 4 times per year    Active member of club or organization: Yes    Attends meetings of clubs or organizations: More than 4 times per year    Relationship status: Married  . Intimate partner violence    Fear of current or ex partner: No    Emotionally abused: No    Physically abused: No    Forced sexual activity: No  Other Topics Concern  . Not on file  Social History Narrative   Patient lives at home with her spouse. Married in 1957. Two people lives in the home, no pets.    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    Caffeine Use: tea     Allergies  Allergen Reactions  . Lactose Other (See Comments)    abd pain, lactose intolerant  . Morphine Other (See Comments)  . Sulfa Antibiotics Other (See Comments)    Headache, very sick  . Codeine Other (See Comments)    Reaction:  Headaches and nightmares   . Lactose Intolerance (Gi) Nausea And Vomiting  . Latex Rash  . Lyrica [Pregabalin] Swelling and Other (See Comments)    Reaction:  Leg  swelling  . Other Other (See Comments)    Pt states that pain medications give her nightmares.    . Plaquenil [Hydroxychloroquine Sulfate] Other (See Comments)    Reaction:  GI upset   . Reglan [Metoclopramide] Other (See Comments)    Reaction:  GI upset   . Requip [Ropinirole Hcl] Other (See Comments)    Reaction:  GI upset   . Septra [Sulfamethoxazole-Trimethoprim] Nausea And Vomiting  . Shellfish Allergy Nausea And Vomiting     Outpatient Medications Prior to Visit  Medication Sig Dispense Refill  . acetaminophen (TYLENOL) 325 MG tablet Take 650 mg by mouth daily.     Marland Kitchen albuterol (PROVENTIL HFA;VENTOLIN HFA) 108 (90 Base) MCG/ACT inhaler Inhale 2 puffs into the lungs every 6 (six) hours as needed for wheezing or shortness of breath. 1 Inhaler 6  . aspirin EC 81 MG tablet Take 81 mg by mouth at bedtime.     . Calcium Carb-Cholecalciferol (CALCIUM+D3 PO) Take 15 mLs by mouth daily.    . carbidopa-levodopa (SINEMET IR) 25-100 MG tablet Take 2 tablets by mouth at bedtime. Take 1 tablet daily    . Cholecalciferol (VITAMIN D) 2000 units CAPS Take 2,000 Units by mouth daily.    Marland Kitchen denosumab (PROLIA) 60 MG/ML SOLN injection Inject 60 mg into the skin every 6 (six) months. Administer in upper arm, thigh, or abdomen    . dexlansoprazole (DEXILANT) 60 MG capsule Take 60 mg by mouth daily.    Marland Kitchen docusate sodium (COLACE) 100 MG capsule Take 100 mg by mouth at bedtime.    . DULoxetine (CYMBALTA) 20 MG capsule TAKE (1) CAPSULE DAILY. 90 capsule 0  . ezetimibe (ZETIA) 10 MG tablet TAKE 1 TABLET ONCE DAILY. 90 tablet 0  . gabapentin (NEURONTIN) 100 MG capsule Take 1 capsule (100 mg total) by mouth at bedtime. 90 capsule 0  . levothyroxine (SYNTHROID, LEVOTHROID) 50 MCG tablet Take 50 mcg by mouth daily before breakfast.     . metoprolol tartrate (LOPRESSOR) 25  MG tablet TAKE 1 TABLET ONCE DAILY. 90 tablet 3  . Multiple Vitamin (MULTIVITAMIN WITH MINERALS) TABS tablet Take 1 tablet by mouth daily.    Marland Kitchen  nystatin (MYCOSTATIN/NYSTOP) powder Apply topically 2 (two) times daily as needed. (Patient taking differently: Apply 1 g topically 2 (two) times daily as needed (for affected areas under breast). ) 15 g 5  . Polyethyl Glycol-Propyl Glycol (SYSTANE) 0.4-0.3 % SOLN Place 1 drop into both eyes 2 (two) times daily as needed (dry/irritated eyes.).     Marland Kitchen Polyethyl Glycol-Propyl Glycol (SYSTANE) 0.4-0.3 % SOLN Apply 1 drop to eye 2 (two) times daily as needed.    . pramipexole (MIRAPEX) 1 MG tablet Take 1 mg by mouth 2 (two) times daily.    . QUEtiapine (SEROQUEL) 25 MG tablet Take half tablet or 1 tablet as needed for Hallucinations 15 tablet 0  . rosuvastatin (CRESTOR) 20 MG tablet Take 1 tablet (20 mg total) by mouth daily. 90 tablet 3  . sertraline (ZOLOFT) 25 MG tablet Take 25 mg by mouth daily.    . Skin Protectants, Misc. (EUCERIN) cream Apply 1 application topically daily.     Marland Kitchen trimethoprim (TRIMPEX) 100 MG tablet Take 100 mg by mouth at bedtime.     . carbidopa-levodopa (SINEMET IR) 25-100 MG tablet Take 2 tablets by mouth 2 (two) times a day. 360 tablet 4   No facility-administered medications prior to visit.         Objective:   Physical Exam Vitals:   07/27/19 1615  BP: 140/70  Pulse: 79  SpO2: 98%  Weight: 153 lb 3.2 oz (69.5 kg)  Height: 5\' 2"  (1.575 m)   Gen: Pleasant, elderly woman, in no distress,  normal affect  ENT: No lesions,  mouth clear,  oropharynx clear, no postnasal drip  Neck: No JVD, no stridor  Lungs: No use of accessory muscles, clear without rales or rhonchi  Cardiovascular: RRR, heart sounds normal, no murmur or gallops, 1+ LE ankle edema  Musculoskeletal: No deformities, no cyanosis or clubbing  Neuro: alert, non focal  Skin: Warm, no lesions or rashes       Assessment & Plan:  DOE (dyspnea on exertion) Multifactorial dyspnea in the setting of obesity, overall debilitation from a Parkinson disease, diastolic dysfunction with some component of  volume overload (controlled).  We undertook a trial of albuterol to see if she would get benefit and she believes that she may have done so.  I do not see a downside to continuing as needed.  We will continue, follow her symptoms.  Please continue to use your albuterol 2 puffs if needed for shortness of breath, chest tightness. You can try using the albuterol about 10 minutes before planned exercise Continue your other medications as ordered, in particular your diuretic medications as managed by cardiology.  Follow with Dr Lamonte Sakai in 6 months or sooner if you have any problems  Baltazar Apo, MD, PhD 07/27/2019, 4:52 PM [facility] [phone] or if no answer [phone]

## 2019-07-27 NOTE — Patient Instructions (Signed)
Please continue to use your albuterol 2 puffs if needed for shortness of breath, chest tightness. You can try using the albuterol about 10 minutes before planned exercise Continue your other medications as ordered, in particular your diuretic medications as managed by cardiology.  Follow with Dr Lamonte Sakai in 6 months or sooner if you have any problems

## 2019-07-28 DIAGNOSIS — Z471 Aftercare following joint replacement surgery: Secondary | ICD-10-CM | POA: Diagnosis not present

## 2019-07-28 DIAGNOSIS — Z4889 Encounter for other specified surgical aftercare: Secondary | ICD-10-CM | POA: Diagnosis not present

## 2019-07-28 DIAGNOSIS — G20 Parkinson's disease: Secondary | ICD-10-CM | POA: Diagnosis not present

## 2019-07-28 DIAGNOSIS — Z7389 Other problems related to life management difficulty: Secondary | ICD-10-CM | POA: Diagnosis not present

## 2019-07-28 DIAGNOSIS — K449 Diaphragmatic hernia without obstruction or gangrene: Secondary | ICD-10-CM | POA: Diagnosis not present

## 2019-07-28 DIAGNOSIS — R278 Other lack of coordination: Secondary | ICD-10-CM | POA: Diagnosis not present

## 2019-07-28 DIAGNOSIS — R490 Dysphonia: Secondary | ICD-10-CM | POA: Diagnosis not present

## 2019-07-28 DIAGNOSIS — K5900 Constipation, unspecified: Secondary | ICD-10-CM | POA: Diagnosis not present

## 2019-07-28 DIAGNOSIS — R260 Ataxic gait: Secondary | ICD-10-CM | POA: Diagnosis not present

## 2019-07-28 DIAGNOSIS — K5909 Other constipation: Secondary | ICD-10-CM | POA: Diagnosis not present

## 2019-07-28 DIAGNOSIS — K22719 Barrett's esophagus with dysplasia, unspecified: Secondary | ICD-10-CM | POA: Diagnosis not present

## 2019-07-28 DIAGNOSIS — R1310 Dysphagia, unspecified: Secondary | ICD-10-CM | POA: Diagnosis not present

## 2019-07-28 DIAGNOSIS — M25531 Pain in right wrist: Secondary | ICD-10-CM | POA: Diagnosis not present

## 2019-07-28 DIAGNOSIS — E785 Hyperlipidemia, unspecified: Secondary | ICD-10-CM | POA: Diagnosis not present

## 2019-07-28 DIAGNOSIS — R2681 Unsteadiness on feet: Secondary | ICD-10-CM | POA: Diagnosis not present

## 2019-07-28 DIAGNOSIS — M6281 Muscle weakness (generalized): Secondary | ICD-10-CM | POA: Diagnosis not present

## 2019-07-28 DIAGNOSIS — R7889 Finding of other specified substances, not normally found in blood: Secondary | ICD-10-CM | POA: Diagnosis not present

## 2019-07-28 DIAGNOSIS — M79601 Pain in right arm: Secondary | ICD-10-CM | POA: Diagnosis not present

## 2019-07-28 DIAGNOSIS — G2581 Restless legs syndrome: Secondary | ICD-10-CM | POA: Diagnosis not present

## 2019-07-28 DIAGNOSIS — M810 Age-related osteoporosis without current pathological fracture: Secondary | ICD-10-CM | POA: Diagnosis not present

## 2019-07-29 DIAGNOSIS — R2681 Unsteadiness on feet: Secondary | ICD-10-CM | POA: Diagnosis not present

## 2019-07-29 DIAGNOSIS — R490 Dysphonia: Secondary | ICD-10-CM | POA: Diagnosis not present

## 2019-07-29 DIAGNOSIS — Z7389 Other problems related to life management difficulty: Secondary | ICD-10-CM | POA: Diagnosis not present

## 2019-07-29 DIAGNOSIS — M6281 Muscle weakness (generalized): Secondary | ICD-10-CM | POA: Diagnosis not present

## 2019-07-29 DIAGNOSIS — G20 Parkinson's disease: Secondary | ICD-10-CM | POA: Diagnosis not present

## 2019-07-29 DIAGNOSIS — R278 Other lack of coordination: Secondary | ICD-10-CM | POA: Diagnosis not present

## 2019-07-30 DIAGNOSIS — H353132 Nonexudative age-related macular degeneration, bilateral, intermediate dry stage: Secondary | ICD-10-CM | POA: Diagnosis not present

## 2019-07-30 DIAGNOSIS — Z961 Presence of intraocular lens: Secondary | ICD-10-CM | POA: Diagnosis not present

## 2019-07-31 DIAGNOSIS — M6281 Muscle weakness (generalized): Secondary | ICD-10-CM | POA: Diagnosis not present

## 2019-07-31 DIAGNOSIS — R490 Dysphonia: Secondary | ICD-10-CM | POA: Diagnosis not present

## 2019-07-31 DIAGNOSIS — Z7389 Other problems related to life management difficulty: Secondary | ICD-10-CM | POA: Diagnosis not present

## 2019-07-31 DIAGNOSIS — R2681 Unsteadiness on feet: Secondary | ICD-10-CM | POA: Diagnosis not present

## 2019-07-31 DIAGNOSIS — R278 Other lack of coordination: Secondary | ICD-10-CM | POA: Diagnosis not present

## 2019-07-31 DIAGNOSIS — G20 Parkinson's disease: Secondary | ICD-10-CM | POA: Diagnosis not present

## 2019-08-03 NOTE — Telephone Encounter (Signed)
Yes; ok to go to carb/levo 1 tab twice a day. -VRP

## 2019-08-03 NOTE — Telephone Encounter (Signed)
pts husband called in and wants to know if med carb/levo can be 2 tabs daily 1 tab in am / 1 tab pm

## 2019-08-03 NOTE — Telephone Encounter (Signed)
Called husband who stated she is a little better on carb/levo three tabs daily. Walking is more hesitant but ok once she's moving  its okay. Theres no change in tremors, hallucinations are the same overall and get better in the evenings. I advised will let Dr Leta Baptist know and call him back tomorrow. He  verbalized understanding, appreciation.

## 2019-08-04 DIAGNOSIS — R2681 Unsteadiness on feet: Secondary | ICD-10-CM | POA: Diagnosis not present

## 2019-08-04 DIAGNOSIS — M6281 Muscle weakness (generalized): Secondary | ICD-10-CM | POA: Diagnosis not present

## 2019-08-04 DIAGNOSIS — Z7389 Other problems related to life management difficulty: Secondary | ICD-10-CM | POA: Diagnosis not present

## 2019-08-04 DIAGNOSIS — G20 Parkinson's disease: Secondary | ICD-10-CM | POA: Diagnosis not present

## 2019-08-04 DIAGNOSIS — R278 Other lack of coordination: Secondary | ICD-10-CM | POA: Diagnosis not present

## 2019-08-04 DIAGNOSIS — R490 Dysphonia: Secondary | ICD-10-CM | POA: Diagnosis not present

## 2019-08-04 NOTE — Telephone Encounter (Signed)
Spoke with husband and advised him Dr Leta Baptist stated she may go to carb/levo one tab two times a day. I advised he call for any concerns, questions. [name] verbalized understanding, appreciation.

## 2019-08-05 DIAGNOSIS — R490 Dysphonia: Secondary | ICD-10-CM | POA: Diagnosis not present

## 2019-08-05 DIAGNOSIS — R2681 Unsteadiness on feet: Secondary | ICD-10-CM | POA: Diagnosis not present

## 2019-08-05 DIAGNOSIS — M6281 Muscle weakness (generalized): Secondary | ICD-10-CM | POA: Diagnosis not present

## 2019-08-05 DIAGNOSIS — R278 Other lack of coordination: Secondary | ICD-10-CM | POA: Diagnosis not present

## 2019-08-05 DIAGNOSIS — G20 Parkinson's disease: Secondary | ICD-10-CM | POA: Diagnosis not present

## 2019-08-05 DIAGNOSIS — Z7389 Other problems related to life management difficulty: Secondary | ICD-10-CM | POA: Diagnosis not present

## 2019-08-07 DIAGNOSIS — R2681 Unsteadiness on feet: Secondary | ICD-10-CM | POA: Diagnosis not present

## 2019-08-07 DIAGNOSIS — R490 Dysphonia: Secondary | ICD-10-CM | POA: Diagnosis not present

## 2019-08-07 DIAGNOSIS — G20 Parkinson's disease: Secondary | ICD-10-CM | POA: Diagnosis not present

## 2019-08-07 DIAGNOSIS — Z7389 Other problems related to life management difficulty: Secondary | ICD-10-CM | POA: Diagnosis not present

## 2019-08-07 DIAGNOSIS — M6281 Muscle weakness (generalized): Secondary | ICD-10-CM | POA: Diagnosis not present

## 2019-08-07 DIAGNOSIS — R278 Other lack of coordination: Secondary | ICD-10-CM | POA: Diagnosis not present

## 2019-08-10 DIAGNOSIS — R490 Dysphonia: Secondary | ICD-10-CM | POA: Diagnosis not present

## 2019-08-10 DIAGNOSIS — Z7389 Other problems related to life management difficulty: Secondary | ICD-10-CM | POA: Diagnosis not present

## 2019-08-10 DIAGNOSIS — R278 Other lack of coordination: Secondary | ICD-10-CM | POA: Diagnosis not present

## 2019-08-10 DIAGNOSIS — R2681 Unsteadiness on feet: Secondary | ICD-10-CM | POA: Diagnosis not present

## 2019-08-10 DIAGNOSIS — M6281 Muscle weakness (generalized): Secondary | ICD-10-CM | POA: Diagnosis not present

## 2019-08-10 DIAGNOSIS — G20 Parkinson's disease: Secondary | ICD-10-CM | POA: Diagnosis not present

## 2019-08-12 DIAGNOSIS — Z4889 Encounter for other specified surgical aftercare: Secondary | ICD-10-CM | POA: Diagnosis not present

## 2019-08-12 DIAGNOSIS — G2581 Restless legs syndrome: Secondary | ICD-10-CM | POA: Diagnosis not present

## 2019-08-12 DIAGNOSIS — K22719 Barrett's esophagus with dysplasia, unspecified: Secondary | ICD-10-CM | POA: Diagnosis not present

## 2019-08-12 DIAGNOSIS — Z7389 Other problems related to life management difficulty: Secondary | ICD-10-CM | POA: Diagnosis not present

## 2019-08-12 DIAGNOSIS — R41841 Cognitive communication deficit: Secondary | ICD-10-CM | POA: Diagnosis not present

## 2019-08-12 DIAGNOSIS — R260 Ataxic gait: Secondary | ICD-10-CM | POA: Diagnosis not present

## 2019-08-12 DIAGNOSIS — M810 Age-related osteoporosis without current pathological fracture: Secondary | ICD-10-CM | POA: Diagnosis not present

## 2019-08-12 DIAGNOSIS — M25531 Pain in right wrist: Secondary | ICD-10-CM | POA: Diagnosis not present

## 2019-08-12 DIAGNOSIS — R2681 Unsteadiness on feet: Secondary | ICD-10-CM | POA: Diagnosis not present

## 2019-08-12 DIAGNOSIS — R7889 Finding of other specified substances, not normally found in blood: Secondary | ICD-10-CM | POA: Diagnosis not present

## 2019-08-12 DIAGNOSIS — R278 Other lack of coordination: Secondary | ICD-10-CM | POA: Diagnosis not present

## 2019-08-12 DIAGNOSIS — Z471 Aftercare following joint replacement surgery: Secondary | ICD-10-CM | POA: Diagnosis not present

## 2019-08-12 DIAGNOSIS — M6281 Muscle weakness (generalized): Secondary | ICD-10-CM | POA: Diagnosis not present

## 2019-08-12 DIAGNOSIS — E785 Hyperlipidemia, unspecified: Secondary | ICD-10-CM | POA: Diagnosis not present

## 2019-08-12 DIAGNOSIS — G20 Parkinson's disease: Secondary | ICD-10-CM | POA: Diagnosis not present

## 2019-08-12 DIAGNOSIS — K449 Diaphragmatic hernia without obstruction or gangrene: Secondary | ICD-10-CM | POA: Diagnosis not present

## 2019-08-12 DIAGNOSIS — R490 Dysphonia: Secondary | ICD-10-CM | POA: Diagnosis not present

## 2019-08-12 DIAGNOSIS — R1310 Dysphagia, unspecified: Secondary | ICD-10-CM | POA: Diagnosis not present

## 2019-08-12 DIAGNOSIS — K5900 Constipation, unspecified: Secondary | ICD-10-CM | POA: Diagnosis not present

## 2019-08-12 DIAGNOSIS — M79601 Pain in right arm: Secondary | ICD-10-CM | POA: Diagnosis not present

## 2019-08-13 ENCOUNTER — Encounter: Payer: Self-pay | Admitting: Podiatry

## 2019-08-13 ENCOUNTER — Ambulatory Visit (INDEPENDENT_AMBULATORY_CARE_PROVIDER_SITE_OTHER): Payer: Medicare Other | Admitting: Podiatry

## 2019-08-13 ENCOUNTER — Other Ambulatory Visit: Payer: Self-pay

## 2019-08-13 DIAGNOSIS — G629 Polyneuropathy, unspecified: Secondary | ICD-10-CM | POA: Diagnosis not present

## 2019-08-13 DIAGNOSIS — R609 Edema, unspecified: Secondary | ICD-10-CM | POA: Diagnosis not present

## 2019-08-13 DIAGNOSIS — B351 Tinea unguium: Secondary | ICD-10-CM | POA: Diagnosis not present

## 2019-08-13 DIAGNOSIS — M19079 Primary osteoarthritis, unspecified ankle and foot: Secondary | ICD-10-CM | POA: Diagnosis not present

## 2019-08-13 MED ORDER — NONFORMULARY OR COMPOUNDED ITEM: 3 refills | Status: DC

## 2019-08-14 DIAGNOSIS — R41841 Cognitive communication deficit: Secondary | ICD-10-CM | POA: Diagnosis not present

## 2019-08-14 DIAGNOSIS — G20 Parkinson's disease: Secondary | ICD-10-CM | POA: Diagnosis not present

## 2019-08-14 DIAGNOSIS — R278 Other lack of coordination: Secondary | ICD-10-CM | POA: Diagnosis not present

## 2019-08-14 DIAGNOSIS — M6281 Muscle weakness (generalized): Secondary | ICD-10-CM | POA: Diagnosis not present

## 2019-08-14 DIAGNOSIS — Z7389 Other problems related to life management difficulty: Secondary | ICD-10-CM | POA: Diagnosis not present

## 2019-08-14 DIAGNOSIS — R490 Dysphonia: Secondary | ICD-10-CM | POA: Diagnosis not present

## 2019-08-17 DIAGNOSIS — G20 Parkinson's disease: Secondary | ICD-10-CM | POA: Diagnosis not present

## 2019-08-17 DIAGNOSIS — M6281 Muscle weakness (generalized): Secondary | ICD-10-CM | POA: Diagnosis not present

## 2019-08-17 DIAGNOSIS — R490 Dysphonia: Secondary | ICD-10-CM | POA: Diagnosis not present

## 2019-08-17 DIAGNOSIS — Z7389 Other problems related to life management difficulty: Secondary | ICD-10-CM | POA: Diagnosis not present

## 2019-08-17 DIAGNOSIS — R41841 Cognitive communication deficit: Secondary | ICD-10-CM | POA: Diagnosis not present

## 2019-08-17 DIAGNOSIS — R278 Other lack of coordination: Secondary | ICD-10-CM | POA: Diagnosis not present

## 2019-08-19 DIAGNOSIS — M6281 Muscle weakness (generalized): Secondary | ICD-10-CM | POA: Diagnosis not present

## 2019-08-19 DIAGNOSIS — R278 Other lack of coordination: Secondary | ICD-10-CM | POA: Diagnosis not present

## 2019-08-19 DIAGNOSIS — Z7389 Other problems related to life management difficulty: Secondary | ICD-10-CM | POA: Diagnosis not present

## 2019-08-19 DIAGNOSIS — G20 Parkinson's disease: Secondary | ICD-10-CM | POA: Diagnosis not present

## 2019-08-19 DIAGNOSIS — R41841 Cognitive communication deficit: Secondary | ICD-10-CM | POA: Diagnosis not present

## 2019-08-19 DIAGNOSIS — R490 Dysphonia: Secondary | ICD-10-CM | POA: Diagnosis not present

## 2019-08-19 NOTE — Progress Notes (Signed)
Subjective: 85 year old female presents the office today for follow evaluation of bilateral foot pain.  She states the swelling has improved.  She has noticed her right big toenail becoming discolored she is aspect of the medication but midfoot on the toe nail itself.  She also still complains of sharp pains to her feet.  She previously had a red area on the top of the right foot that resolved.  Denies any systemic complaints such as fevers, chills, nausea, vomiting. No acute changes since last appointment, and no other complaints at this time.   Objective: AAO x3, NAD DP/PT pulses palpable bilaterally, CRT less than 3 seconds Chronic extremity edema is present however improved compared to last appointment.  There is no area pinpoint tenderness.  She describing more sharp, nerve symptoms to her big toes.  She also gets it of the foot at times and this mostly towards the end of the day but she can get them sporadically during the day as well.  Her right toenail is hypertrophic, dystrophic with yellow-brown discoloration.  No pain the toenail itself but no signs of infection.  No pain with calf compression, swelling, warmth, erythema  Assessment: Right hallux onychomycosis, likely neuropathy/neuritis symptoms, improved swelling  Plan: -All treatment options discussed with the patient including all alternatives, risks, complications.  -Dispensed formula 7 for her toenail -Order compound cream for help with neuropathy, neuritis symptoms.  She also hold off on any other oral medication -Continue with supportive shoes.  Encouraged elevation up the swelling. -Patient encouraged to call the office with any questions, concerns, change in symptoms.   Trula Slade DPM

## 2019-08-20 DIAGNOSIS — G20 Parkinson's disease: Secondary | ICD-10-CM | POA: Diagnosis not present

## 2019-08-20 DIAGNOSIS — R278 Other lack of coordination: Secondary | ICD-10-CM | POA: Diagnosis not present

## 2019-08-20 DIAGNOSIS — R490 Dysphonia: Secondary | ICD-10-CM | POA: Diagnosis not present

## 2019-08-20 DIAGNOSIS — M6281 Muscle weakness (generalized): Secondary | ICD-10-CM | POA: Diagnosis not present

## 2019-08-20 DIAGNOSIS — Z7389 Other problems related to life management difficulty: Secondary | ICD-10-CM | POA: Diagnosis not present

## 2019-08-20 DIAGNOSIS — R41841 Cognitive communication deficit: Secondary | ICD-10-CM | POA: Diagnosis not present

## 2019-08-21 DIAGNOSIS — F331 Major depressive disorder, recurrent, moderate: Secondary | ICD-10-CM | POA: Diagnosis not present

## 2019-08-24 DIAGNOSIS — R278 Other lack of coordination: Secondary | ICD-10-CM | POA: Diagnosis not present

## 2019-08-24 DIAGNOSIS — L439 Lichen planus, unspecified: Secondary | ICD-10-CM | POA: Diagnosis not present

## 2019-08-24 DIAGNOSIS — R41841 Cognitive communication deficit: Secondary | ICD-10-CM | POA: Diagnosis not present

## 2019-08-24 DIAGNOSIS — R490 Dysphonia: Secondary | ICD-10-CM | POA: Diagnosis not present

## 2019-08-24 DIAGNOSIS — M6281 Muscle weakness (generalized): Secondary | ICD-10-CM | POA: Diagnosis not present

## 2019-08-24 DIAGNOSIS — G20 Parkinson's disease: Secondary | ICD-10-CM | POA: Diagnosis not present

## 2019-08-24 DIAGNOSIS — Z7389 Other problems related to life management difficulty: Secondary | ICD-10-CM | POA: Diagnosis not present

## 2019-08-24 DIAGNOSIS — D485 Neoplasm of uncertain behavior of skin: Secondary | ICD-10-CM | POA: Diagnosis not present

## 2019-08-25 DIAGNOSIS — Z7389 Other problems related to life management difficulty: Secondary | ICD-10-CM | POA: Diagnosis not present

## 2019-08-25 DIAGNOSIS — R490 Dysphonia: Secondary | ICD-10-CM | POA: Diagnosis not present

## 2019-08-25 DIAGNOSIS — G20 Parkinson's disease: Secondary | ICD-10-CM | POA: Diagnosis not present

## 2019-08-25 DIAGNOSIS — M6281 Muscle weakness (generalized): Secondary | ICD-10-CM | POA: Diagnosis not present

## 2019-08-25 DIAGNOSIS — R41841 Cognitive communication deficit: Secondary | ICD-10-CM | POA: Diagnosis not present

## 2019-08-25 DIAGNOSIS — R278 Other lack of coordination: Secondary | ICD-10-CM | POA: Diagnosis not present

## 2019-08-26 ENCOUNTER — Telehealth: Payer: Self-pay | Admitting: Diagnostic Neuroimaging

## 2019-08-26 DIAGNOSIS — R490 Dysphonia: Secondary | ICD-10-CM | POA: Diagnosis not present

## 2019-08-26 DIAGNOSIS — Z7389 Other problems related to life management difficulty: Secondary | ICD-10-CM | POA: Diagnosis not present

## 2019-08-26 DIAGNOSIS — R41841 Cognitive communication deficit: Secondary | ICD-10-CM | POA: Diagnosis not present

## 2019-08-26 DIAGNOSIS — M6281 Muscle weakness (generalized): Secondary | ICD-10-CM | POA: Diagnosis not present

## 2019-08-26 DIAGNOSIS — R278 Other lack of coordination: Secondary | ICD-10-CM | POA: Diagnosis not present

## 2019-08-26 DIAGNOSIS — G20 Parkinson's disease: Secondary | ICD-10-CM | POA: Diagnosis not present

## 2019-08-26 NOTE — Telephone Encounter (Signed)
[name] called to inform provider that the pt continues to have hallucinations. [name] would like to know the provider's thoughts on the new medication called Nuplazid. Pt's condition has deteriorated so bad that she has been put in skilled nursing. Please advise.

## 2019-08-27 DIAGNOSIS — G20 Parkinson's disease: Secondary | ICD-10-CM | POA: Diagnosis not present

## 2019-08-27 DIAGNOSIS — R278 Other lack of coordination: Secondary | ICD-10-CM | POA: Diagnosis not present

## 2019-08-27 DIAGNOSIS — R490 Dysphonia: Secondary | ICD-10-CM | POA: Diagnosis not present

## 2019-08-27 DIAGNOSIS — Z7389 Other problems related to life management difficulty: Secondary | ICD-10-CM | POA: Diagnosis not present

## 2019-08-27 DIAGNOSIS — M6281 Muscle weakness (generalized): Secondary | ICD-10-CM | POA: Diagnosis not present

## 2019-08-27 DIAGNOSIS — R41841 Cognitive communication deficit: Secondary | ICD-10-CM | POA: Diagnosis not present

## 2019-08-31 DIAGNOSIS — R278 Other lack of coordination: Secondary | ICD-10-CM | POA: Diagnosis not present

## 2019-08-31 DIAGNOSIS — G20 Parkinson's disease: Secondary | ICD-10-CM | POA: Diagnosis not present

## 2019-08-31 DIAGNOSIS — R490 Dysphonia: Secondary | ICD-10-CM | POA: Diagnosis not present

## 2019-08-31 DIAGNOSIS — R41841 Cognitive communication deficit: Secondary | ICD-10-CM | POA: Diagnosis not present

## 2019-08-31 DIAGNOSIS — Z7389 Other problems related to life management difficulty: Secondary | ICD-10-CM | POA: Diagnosis not present

## 2019-08-31 DIAGNOSIS — M6281 Muscle weakness (generalized): Secondary | ICD-10-CM | POA: Diagnosis not present

## 2019-08-31 NOTE — Telephone Encounter (Signed)
Currently on seqoquel as needed. May consider switch to nuplazid 34mg  daily. Will get input from [facility] senior care (Dr. Lyndel Safe). -VRP

## 2019-08-31 NOTE — Telephone Encounter (Signed)
Will hold off on nuplazid from now until she transfers to higher level of care at SNF. -VRP

## 2019-09-01 DIAGNOSIS — R490 Dysphonia: Secondary | ICD-10-CM | POA: Diagnosis not present

## 2019-09-01 DIAGNOSIS — M6281 Muscle weakness (generalized): Secondary | ICD-10-CM | POA: Diagnosis not present

## 2019-09-01 DIAGNOSIS — G20 Parkinson's disease: Secondary | ICD-10-CM | POA: Diagnosis not present

## 2019-09-01 DIAGNOSIS — R278 Other lack of coordination: Secondary | ICD-10-CM | POA: Diagnosis not present

## 2019-09-01 DIAGNOSIS — Z7389 Other problems related to life management difficulty: Secondary | ICD-10-CM | POA: Diagnosis not present

## 2019-09-01 DIAGNOSIS — R41841 Cognitive communication deficit: Secondary | ICD-10-CM | POA: Diagnosis not present

## 2019-09-01 NOTE — Telephone Encounter (Signed)
Called [place] and advised her of Dr Lyndel Safe and Dr Gladstone Lighter recommendations. She verbalized understanding, appreciation.

## 2019-09-03 ENCOUNTER — Encounter: Payer: Self-pay | Admitting: *Deleted

## 2019-09-03 LAB — CHLORIDE
Albumin: 3.7
Calcium: 9
Carbon Dioxide, Total: 24
Chloride: 110
EGFR (Non-African Amer.): 79
Globulin: 2.1
Total Protein: 5.8 g/dL

## 2019-09-04 ENCOUNTER — Non-Acute Institutional Stay (SKILLED_NURSING_FACILITY): Payer: Medicare Other | Admitting: Internal Medicine

## 2019-09-04 ENCOUNTER — Encounter: Payer: Self-pay | Admitting: Internal Medicine

## 2019-09-04 DIAGNOSIS — K219 Gastro-esophageal reflux disease without esophagitis: Secondary | ICD-10-CM | POA: Diagnosis not present

## 2019-09-04 DIAGNOSIS — E039 Hypothyroidism, unspecified: Secondary | ICD-10-CM | POA: Diagnosis not present

## 2019-09-04 DIAGNOSIS — I739 Peripheral vascular disease, unspecified: Secondary | ICD-10-CM | POA: Diagnosis not present

## 2019-09-04 DIAGNOSIS — G20 Parkinson's disease: Secondary | ICD-10-CM | POA: Diagnosis not present

## 2019-09-04 DIAGNOSIS — I10 Essential (primary) hypertension: Secondary | ICD-10-CM | POA: Diagnosis not present

## 2019-09-04 DIAGNOSIS — M810 Age-related osteoporosis without current pathological fracture: Secondary | ICD-10-CM

## 2019-09-04 DIAGNOSIS — R441 Visual hallucinations: Secondary | ICD-10-CM | POA: Diagnosis not present

## 2019-09-04 DIAGNOSIS — F329 Major depressive disorder, single episode, unspecified: Secondary | ICD-10-CM | POA: Diagnosis not present

## 2019-09-04 DIAGNOSIS — F32A Depression, unspecified: Secondary | ICD-10-CM

## 2019-09-04 DIAGNOSIS — I5032 Chronic diastolic (congestive) heart failure: Secondary | ICD-10-CM | POA: Diagnosis not present

## 2019-09-04 NOTE — Progress Notes (Addendum)
Provider:   Location:  [facility] Service:  SNF (31)  PCP: [name], MD Patient Care Team: [name], MD as PCP - General (Internal Medicine) [name], MD as PCP - Cardiology (Cardiology) [name], MD (Neurosurgery) [name], MD (Neurology) [name], MD as Consulting Physician (Gastroenterology) [name], MD as Consulting Physician (Urology) [name], Man X, NP as Nurse Practitioner (Internal Medicine)  Extended Emergency Contact Information Primary Emergency Contact: [facility] Address: [address]          [place], [place] [address] [name] of [place] Phone: [phone] Mobile Phone: [phone] Relation: Spouse Secondary Emergency Contact: [name] Mobile Phone: [phone] Relation: Daughter Preferred language: [name] Interpreter needed? No  Code Status:  Goals of Care: Advanced Directive information Advanced Directives 04/23/2019  Does Patient Have a Medical Advance Directive? Yes  Type of Advance Directive Out of facility DNR (pink MOST or yellow form)  Does patient want to make changes to medical advance directive? No - Patient declined  Copy of [place] in Chart? -  Would patient like information on creating a medical advance directive? No - Patient declined  Pre-existing out of facility DNR order (yellow form or pink MOST form) Yellow form placed in chart (order not valid for inpatient use)      Chief Complaint  Patient presents with  . New Admit To SNF    HPI: Patient is a 85 y.o. female seen today for admission to SNF for therapy and possibly long-term care. Patient has history of Parkinson disease with visual hallucinations, peripheral neuropathy, hypothyroid, GERD, osteoporosis, hyperlipidemia, gout, dyspnea on exertion  Patient was living with her husband in [place] apartment in [facility] Per her husband patient is getting dependent for her  ADLs.  She was unable to get out of bed and take care of herself without his help. She also continues to have hallucinations.  Patient said that now she knows how to differentiate them and it does not bother her that much. But husband was unable to take care of her at home and wants to move her to higher level of care. Patient did not have any acute complaints today.   Past Medical History:  Diagnosis Date  . Anemia   . Anxiety   . Aortic stenosis    moderate by echo 2019  . Arthritis   . Broken arm    right   . Chronic diastolic CHF (congestive heart failure) ([place])   . Family history of adverse reaction to anesthesia    pt. states sister vomits  . Fibromyalgia   . Fracture of thumb 09/01/2015   right  . Fracture, foot 09/01/2015   right  . GERD (gastroesophageal reflux disease)   . H/O hiatal hernia   . History of bronchitis   . History of kidney stones   . Hypercholesterolemia   . Hypertension    dr t turner  . Hypothyroidism   . Neuropathy   . Osteoporosis   . Parkinson disease ([place])   . PVD (peripheral vascular disease) ([facility])    99% stenosis of left anteiror tibial artery, mod stenosis of left distal SFA and popliteal artery followed by [name]  . RBBB    noted on EKG 2018  . Restless leg   . Shortness of breath    with exertion - chronic   . Sjogren's disease ([place])   . SOB (shortness of breath)  chronic due to diastolic dysfunction, deconditioning, obesity  . Tremor    Past Surgical History:  Procedure Laterality Date  . ABDOMINAL HYSTERECTOMY    . BACK SURGERY     4 back surgeries,   lumbar fusion  . CARDIAC CATHETERIZATION  2006   normal  . CYSTOSCOPY/URETEROSCOPY/HOLMIUM LASER/STENT PLACEMENT Left 01/06/2019   Procedure: CYSTOSCOPY/RETROGRADE/URETEROSCOPY/HOLMIUM LASER/BASKET RETRIEVAL/STENT PLACEMENT;  Surgeon: Ceasar Mons, MD;  Location: [facility];  Service: Urology;  Laterality: Left;  . EYE SURGERY Bilateral    cateracts  . falls      variious fall, broken wrist,and toes  . FRACTURE SURGERY Right April 2016   Wrist, Pt. fell  . kidney stone removal Left 01/20/2019  . RIGHT/LEFT HEART CATH AND CORONARY ANGIOGRAPHY N/A 02/05/2018   Procedure: RIGHT/LEFT HEART CATH AND CORONARY ANGIOGRAPHY;  Surgeon: Troy Sine, MD;  Location: [facility];  Service: Cardiovascular;  Laterality: N/A;  . SPINAL CORD STIMULATOR INSERTION N/A 09/09/2015   Procedure: LUMBAR SPINAL CORD STIMULATOR INSERTION;  Surgeon: Clydell Hakim, MD;  Location: [facility];  Service: Neurosurgery;  Laterality: N/A;  LUMBAR SPINAL CORD STIMULATOR INSERTION  . SPINE SURGERY  April 2013   Back X's 4  . TOTAL HIP ARTHROPLASTY     right  . WRIST FRACTURE SURGERY Bilateral     reports that she has never smoked. She has never used smokeless tobacco. She reports that she does not drink alcohol or use drugs. Social History   Socioeconomic History  . Marital status: Married    Spouse name: [name]  . Number of children: 2  . Years of education: HS  . Highest education level: Not on file  Occupational History    Comment: Homemaker  Social Needs  . Financial resource strain: Not hard at all  . Food insecurity    Worry: Never true    Inability: Never true  . Transportation needs    Medical: No    Non-medical: No  Tobacco Use  . Smoking status: Never Smoker  . Smokeless tobacco: Never Used  Substance and Sexual Activity  . Alcohol use: No    Alcohol/week: 0.0 standard drinks  . Drug use: No  . Sexual activity: Never  Lifestyle  . Physical activity    Days per week: 2 days    Minutes per session: 30 min  . Stress: Only a little  Relationships  . Social connections    Talks on phone: More than three times a week    Gets together: More than three times a week    Attends religious service: More than 4 times per year    Active member of club or organization: Yes    Attends meetings of clubs or organizations: More than 4 times per year     Relationship status: Married  . Intimate partner violence    Fear of current or ex partner: No    Emotionally abused: No    Physically abused: No    Forced sexual activity: No  Other Topics Concern  . Not on file  Social History Narrative   Patient lives at home with her spouse. Married in 1957. Two people lives in the home, no pets.    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    Caffeine Use: tea    Functional Status Survey:    Family History  Problem Relation Age of Onset  . Heart attack Mother   . Restless  legs syndrome Mother   . Heart failure Mother   . Heart disease Mother   . Hypertension Mother   . COPD Father     Health Maintenance  Topic Date Due  . INFLUENZA VACCINE  07/11/2019  . TETANUS/TDAP  08/31/2025  . DEXA SCAN  Completed  . PNA vac Low Risk Adult  Completed    Allergies  Allergen Reactions  . Lactose Other (See Comments)    abd pain, lactose intolerant  . Morphine Other (See Comments)  . Sulfa Antibiotics Other (See Comments)    Headache, very sick  . Codeine Other (See Comments)    Reaction:  Headaches and nightmares   . Lactose Intolerance (Gi) Nausea And Vomiting  . Latex Rash  . Lyrica [Pregabalin] Swelling and Other (See Comments)    Reaction:  Leg swelling  . Other Other (See Comments)    Pt states that pain medications give her nightmares.    . Plaquenil [Hydroxychloroquine Sulfate] Other (See Comments)    Reaction:  GI upset   . Reglan [Metoclopramide] Other (See Comments)    Reaction:  GI upset   . Requip [Ropinirole Hcl] Other (See Comments)    Reaction:  GI upset   . Septra [Sulfamethoxazole-Trimethoprim] Nausea And Vomiting  . Shellfish Allergy Nausea And Vomiting    Outpatient Encounter Medications as of 09/04/2019  Medication Sig  . pantoprazole (PROTONIX) 20 MG tablet Take 40 mg by mouth daily.  Marland Kitchen acetaminophen (TYLENOL) 325 MG tablet Take 650 mg by mouth daily.   Marland Kitchen  albuterol (PROVENTIL HFA;VENTOLIN HFA) 108 (90 Base) MCG/ACT inhaler Inhale 2 puffs into the lungs every 6 (six) hours as needed for wheezing or shortness of breath.  Marland Kitchen aspirin EC 81 MG tablet Take 81 mg by mouth at bedtime.   . Calcium Carb-Cholecalciferol (CALCIUM+D3 PO) Take 15 mLs by mouth daily.  . carbidopa-levodopa (SINEMET IR) 25-100 MG tablet Take 2 tablets by mouth 2 (two) times a day. (Patient taking differently: Take 1 tablet by mouth daily. )  . carbidopa-levodopa (SINEMET IR) 25-100 MG tablet Take 2 tablets by mouth at bedtime. Take 1 tablet daily  . Cholecalciferol (VITAMIN D) 2000 units CAPS Take 2,000 Units by mouth daily.  Marland Kitchen denosumab (PROLIA) 60 MG/ML SOLN injection Inject 60 mg into the skin every 6 (six) months. Administer in upper arm, thigh, or abdomen  . diclofenac sodium (VOLTAREN) 1 % GEL   . docusate sodium (COLACE) 100 MG capsule Take 100 mg by mouth at bedtime.  . DULoxetine (CYMBALTA) 20 MG capsule TAKE (1) CAPSULE DAILY.  Marland Kitchen ezetimibe (ZETIA) 10 MG tablet TAKE 1 TABLET ONCE DAILY.  . fluorouracil (EFUDEX) 5 % cream   . gabapentin (NEURONTIN) 100 MG capsule Take 1 capsule (100 mg total) by mouth at bedtime.  Marland Kitchen levothyroxine (SYNTHROID, LEVOTHROID) 50 MCG tablet Take 50 mcg by mouth daily before breakfast.   . metoprolol tartrate (LOPRESSOR) 25 MG tablet TAKE 1 TABLET ONCE DAILY.  . Multiple Vitamin (MULTIVITAMIN WITH MINERALS) TABS tablet Take 1 tablet by mouth daily.  . NONFORMULARY OR COMPOUNDED ITEM Peripheral Neuropathy Cream: Bupivacaine 1%, Doxepin 3%, Gabapentin 6%, Pentoxifylline 3%, Topiramate 1% Order faxed to [facility]  . nystatin (MYCOSTATIN/NYSTOP) powder Apply topically 2 (two) times daily as needed. (Patient taking differently: Apply 1 g topically 2 (two) times daily as needed (for affected areas under breast). )  . Polyethyl Glycol-Propyl Glycol (SYSTANE) 0.4-0.3 % SOLN Place 1 drop into both eyes 2 (two) times daily as needed (dry/irritated  eyes.).   Marland Kitchen Polyethyl Glycol-Propyl Glycol (SYSTANE) 0.4-0.3 % SOLN Apply 1 drop to eye 2 (two) times daily as needed.  . pramipexole (MIRAPEX) 1 MG tablet Take 1 mg by mouth 2 (two) times daily.  . rosuvastatin (CRESTOR) 20 MG tablet Take 1 tablet (20 mg total) by mouth daily.  . sertraline (ZOLOFT) 25 MG tablet Take 50 mg by mouth daily.   . Skin Protectants, Misc. (EUCERIN) cream Apply 1 application topically daily.   . [DISCONTINUED] dexlansoprazole (DEXILANT) 60 MG capsule Take 60 mg by mouth daily.  . [DISCONTINUED] QUEtiapine (SEROQUEL) 25 MG tablet Take half tablet or 1 tablet as needed for Hallucinations  . [DISCONTINUED] trimethoprim (TRIMPEX) 100 MG tablet Take 100 mg by mouth at bedtime.    No facility-administered encounter medications on file as of 09/04/2019.     Review of Systems  Constitutional: Positive for activity change and appetite change.  HENT: Negative.   Respiratory: Positive for shortness of breath.   Cardiovascular: Positive for leg swelling.  Gastrointestinal: Positive for constipation.  Genitourinary: Negative.   Musculoskeletal: Positive for arthralgias, back pain and gait problem.  Skin: Negative.   Neurological: Positive for tremors and weakness.  Psychiatric/Behavioral: Positive for hallucinations.    Vitals:   09/04/19 0830  BP: (!) 164/80  Pulse: 70  Resp: 18  Temp: 97.8 F (36.6 C)   There is no height or weight on file to calculate BMI. Physical Exam Vitals signs reviewed.  Constitutional:      Appearance: Normal appearance.  HENT:     Head: Normocephalic.     Nose: Nose normal.     Mouth/Throat:     Mouth: Mucous membranes are moist.     Pharynx: Oropharynx is clear.  Eyes:     Pupils: Pupils are equal, round, and reactive to light.  Neck:     Musculoskeletal: Neck supple.  Cardiovascular:     Rate and Rhythm: Normal rate and regular rhythm.     Pulses: Normal pulses.     Heart sounds: Murmur present.  Pulmonary:     Effort:  Pulmonary effort is normal. No respiratory distress.     Breath sounds: Normal breath sounds. No wheezing or rales.  Abdominal:     General: Abdomen is flat. Bowel sounds are normal.     Palpations: Abdomen is soft.  Musculoskeletal:        General: Swelling present.  Skin:    General: Skin is warm and dry.  Neurological:     Mental Status: She is alert and oriented to person, place, and time.     Comments: Patient alert and Oriented. Unable to get up from the chair. Needing Assist to walk with walker Tremor is mild  Psychiatric:        Mood and Affect: Mood normal.        Thought Content: Thought content normal.        Judgment: Judgment normal.     Labs reviewed: Basic Metabolic Panel: Recent Labs    01/02/19 1311 05/26/19 0906 07/24/19  NA 138 143 142  K 4.7 4.1 4.3  CL 108 110 110  CO2 21* 24 24  GLUCOSE 118* 97  --   BUN 14 13 12   CREATININE 1.34* 0.65 0.7  CALCIUM 9.5 9.0 9.0   Liver Function Tests: Recent Labs    01/02/19 1935 07/14/19 0811 07/24/19  AST 24 21 43*  ALT 13 7 5*  ALKPHOS 56 56 0.4*  BILITOT 0.8 0.3  --  PROT 6.4* 6.1 5.8  ALBUMIN 3.5 4.3 3.7   Recent Labs    01/02/19 1935  LIPASE 26   No results for input(s): AMMONIA in the last 8760 hours. CBC: Recent Labs    01/02/19 1311 05/26/19 0906  WBC 10.8* 7.4  NEUTROABS  --  4,862  HGB 9.6* 9.4*  HCT 31.4* 29.8*  MCV 92.1 89.5  PLT 288 302   Cardiac Enzymes: No results for input(s): CKTOTAL, CKMB, CKMBINDEX, TROPONINI in the last 8760 hours. BNP: Invalid input(s): POCBNP No results found for: HGBA1C Lab Results  Component Value Date   TSH 1.77 07/01/2018   No results found for: VITAMINB12 No results found for: FOLATE No results found for: IRON, TIBC, FERRITIN  Imaging and Procedures obtained prior to SNF admission: Dg Chest 2 View  Result Date: 01/16/2019 CLINICAL DATA:  Shortness of breath. EXAM: CHEST - 2 VIEW COMPARISON:  No recent prior. FINDINGS: Mediastinum and  hilar structures normal. Heart size normal. No pulmonary venous congestion. Bibasilar atelectasis. Mild bibasilar infiltrates can not be excluded. Small bilateral pleural effusions. No pneumothorax. Elevation left hemidiaphragm. Neurostimulator noted over the thoracic spine. Prior lumbar spine fusion. Linear density noted anterior to the upper lumbar spinal lateral view, this could represent a catheter, question etiology. Sliding hiatal hernia. IMPRESSION: 1. Bibasilar atelectasis. Mild bibasilar infiltrates can not be excluded. 2.  Sliding hiatal hernia. Electronically Signed   By: Marcello Moores  Register   On: 01/16/2019 07:48    Assessment/Plan  Parkinson's disease ([place]) - Plan:  Follows with Neurology Has recent Worsening Physically more dependent for her ADLS Will start therapy  Hallucinations, visual - Plan:  Patient coping well right now Will observe in the facility Possible Seroquel PRN low dose. Per Husband she got very sleepy with Seroquel. ? Nuplazid if not able to tolerate Seroquel  Essential hypertension - Plan:  On Low dose of Lopressor  Depression, unspecified depression type - Plan:  Continue on Zoloft and Cymbalta Hypothyroid Last TSH normal in 7/19 Same dose for now Repeat TSH Osteoporosis  - Plan:  Continue on Prolia since 3/16 and calcium  Will need Repeat Imaging GERD Will change ot Protonix in facility Hyperlipidemia Continue on Crestor Dyspnea on Exertion Was seen recently by Pulmonologist They said to continue Albuterol BID LE swelling Not on Any diuretic Continue ted hoses for now Constipation On Senna and Colace Precancerous Lesion on Lip Started on 5 Fluorouracil BID for 3 weeks per dermatology  Family/ staff Communication:   Labs/tests ordered: CMP,CBC, TSH  Total time spent in this patient care encounter was  45_  minutes; greater than 50% of the visit spent counseling patient and staff, reviewing records , Labs and coordinating care for  problems addressed at this encounter.

## 2019-09-07 DIAGNOSIS — R278 Other lack of coordination: Secondary | ICD-10-CM | POA: Diagnosis not present

## 2019-09-07 DIAGNOSIS — R2681 Unsteadiness on feet: Secondary | ICD-10-CM | POA: Diagnosis not present

## 2019-09-07 DIAGNOSIS — R41841 Cognitive communication deficit: Secondary | ICD-10-CM | POA: Diagnosis not present

## 2019-09-07 DIAGNOSIS — Z20828 Contact with and (suspected) exposure to other viral communicable diseases: Secondary | ICD-10-CM | POA: Diagnosis not present

## 2019-09-07 DIAGNOSIS — M6281 Muscle weakness (generalized): Secondary | ICD-10-CM | POA: Diagnosis not present

## 2019-09-07 DIAGNOSIS — R29898 Other symptoms and signs involving the musculoskeletal system: Secondary | ICD-10-CM | POA: Diagnosis not present

## 2019-09-07 DIAGNOSIS — Z7389 Other problems related to life management difficulty: Secondary | ICD-10-CM | POA: Diagnosis not present

## 2019-09-08 DIAGNOSIS — R443 Hallucinations, unspecified: Secondary | ICD-10-CM | POA: Diagnosis not present

## 2019-09-08 DIAGNOSIS — M6281 Muscle weakness (generalized): Secondary | ICD-10-CM | POA: Diagnosis not present

## 2019-09-08 DIAGNOSIS — R41841 Cognitive communication deficit: Secondary | ICD-10-CM | POA: Diagnosis not present

## 2019-09-08 DIAGNOSIS — R2681 Unsteadiness on feet: Secondary | ICD-10-CM | POA: Diagnosis not present

## 2019-09-08 DIAGNOSIS — R29898 Other symptoms and signs involving the musculoskeletal system: Secondary | ICD-10-CM | POA: Diagnosis not present

## 2019-09-08 DIAGNOSIS — Z7389 Other problems related to life management difficulty: Secondary | ICD-10-CM | POA: Diagnosis not present

## 2019-09-08 DIAGNOSIS — Z03818 Encounter for observation for suspected exposure to other biological agents ruled out: Secondary | ICD-10-CM | POA: Diagnosis not present

## 2019-09-08 DIAGNOSIS — E039 Hypothyroidism, unspecified: Secondary | ICD-10-CM | POA: Diagnosis not present

## 2019-09-08 DIAGNOSIS — R278 Other lack of coordination: Secondary | ICD-10-CM | POA: Diagnosis not present

## 2019-09-08 LAB — HEPATIC FUNCTION PANEL
ALT: 12 (ref 7–35)
AST: 23 (ref 13–35)
Alkaline Phosphatase: 46 (ref 25–125)
Bilirubin, Total: 0.5

## 2019-09-08 LAB — CBC AND DIFFERENTIAL
HCT: 34 — AB (ref 36–46)
Hemoglobin: 11.1 — AB (ref 12.0–16.0)
Platelets: 245 (ref 150–399)
WBC: 7.6

## 2019-09-08 LAB — BASIC METABOLIC PANEL
BUN: 10 (ref 4–21)
Creatinine: 0.7 (ref 0.5–1.1)
Glucose: 97
Potassium: 3.5 (ref 3.4–5.3)
Sodium: 144 (ref 137–147)

## 2019-09-08 LAB — TSH: TSH: 1.62 (ref 0.41–5.90)

## 2019-09-10 DIAGNOSIS — Z9181 History of falling: Secondary | ICD-10-CM | POA: Diagnosis not present

## 2019-09-10 DIAGNOSIS — R29898 Other symptoms and signs involving the musculoskeletal system: Secondary | ICD-10-CM | POA: Diagnosis not present

## 2019-09-10 DIAGNOSIS — R2681 Unsteadiness on feet: Secondary | ICD-10-CM | POA: Diagnosis not present

## 2019-09-10 DIAGNOSIS — Z20828 Contact with and (suspected) exposure to other viral communicable diseases: Secondary | ICD-10-CM | POA: Diagnosis not present

## 2019-09-10 DIAGNOSIS — M25561 Pain in right knee: Secondary | ICD-10-CM | POA: Diagnosis not present

## 2019-09-10 DIAGNOSIS — Z7389 Other problems related to life management difficulty: Secondary | ICD-10-CM | POA: Diagnosis not present

## 2019-09-10 DIAGNOSIS — G20 Parkinson's disease: Secondary | ICD-10-CM | POA: Diagnosis not present

## 2019-09-10 DIAGNOSIS — F060 Psychotic disorder with hallucinations due to known physiological condition: Secondary | ICD-10-CM | POA: Diagnosis not present

## 2019-09-10 DIAGNOSIS — R278 Other lack of coordination: Secondary | ICD-10-CM | POA: Diagnosis not present

## 2019-09-10 DIAGNOSIS — M6281 Muscle weakness (generalized): Secondary | ICD-10-CM | POA: Diagnosis not present

## 2019-09-11 ENCOUNTER — Encounter: Payer: Self-pay | Admitting: Internal Medicine

## 2019-09-11 ENCOUNTER — Non-Acute Institutional Stay (SKILLED_NURSING_FACILITY): Payer: Medicare Other | Admitting: Internal Medicine

## 2019-09-11 DIAGNOSIS — R441 Visual hallucinations: Secondary | ICD-10-CM

## 2019-09-11 DIAGNOSIS — F329 Major depressive disorder, single episode, unspecified: Secondary | ICD-10-CM | POA: Diagnosis not present

## 2019-09-11 DIAGNOSIS — G20 Parkinson's disease: Secondary | ICD-10-CM

## 2019-09-11 DIAGNOSIS — I10 Essential (primary) hypertension: Secondary | ICD-10-CM | POA: Diagnosis not present

## 2019-09-11 DIAGNOSIS — F32A Depression, unspecified: Secondary | ICD-10-CM

## 2019-09-11 NOTE — Progress Notes (Signed)
Location:    [facility] Room Number: [id number] Place of Service:  SNF [id number]) Provider:  [name] MD  [name], MD  Patient Care Team: [name], MD as PCP - General (Internal Medicine) [name], MD as PCP - Cardiology (Cardiology) [name], MD (Neurosurgery) [name], MD (Neurology) [name], MD as Consulting Physician (Gastroenterology) [name], MD as Consulting Physician (Urology) [name], Man X, NP as Nurse Practitioner (Internal Medicine)  Extended Emergency Contact Information Primary Emergency Contact: [facility] Address: [address]          [place], [place] [address] [name] [place] Phone: [phone] Mobile Phone: [phone] Relation: Spouse Secondary Emergency Contact: [name] Mobile Phone: [phone] Relation: Daughter Preferred language: [name] Interpreter needed? No  Code Status:  Full code Goals of care: Advanced Directive information Advanced Directives 09/11/2019  Does Patient Have a Medical Advance Directive? No  Type of Advance Directive -  Does patient want to make changes to medical advance directive? -  Copy of [place] in Chart? -  Would patient like information on creating a medical advance directive? -  Pre-existing out of facility DNR order (yellow form or pink MOST form) -     Chief Complaint  Patient presents with  . Acute Visit    Hallucinations    HPI:  Pt is a 85 y.o. female seen today for an acute visit for confusion and hallucination  Patient has history of Parkinson disease with visual hallucinations, peripheral neuropathy, hypothyroid, GERD, osteoporosis, hyperlipidemia, gout, dyspnea on exertion  Patient was living with her husband in [place] apartment in [facility] Per her husband patient is getting dependent for her ADLs.  She was unable to get out of bed and take care of herself without his help. Patient continues to have  hallucinations also Since patient has been here she has been little confused.  Keeps calling her husband complaining that she is not pregnant.  Also continues to have hallucinations. When I went to see the patient she was confused but is very aware of her hallucinations.  She keeps saying that she has to get back to her apartment and be with her husband.  She also continues to have Gait instability needing a lot of care with her ADLs   Past Medical History:  Diagnosis Date  . Anemia   . Anxiety   . Aortic stenosis    moderate by echo 2019  . Arthritis   . Broken arm    right   . Chronic diastolic CHF (congestive heart failure) ([place])   . Family history of adverse reaction to anesthesia    pt. states sister vomits  . Fibromyalgia   . Fracture of thumb 09/01/2015   right  . Fracture, foot 09/01/2015   right  . GERD (gastroesophageal reflux disease)   . H/O hiatal hernia   . History of bronchitis   . History of kidney stones   . Hypercholesterolemia   . Hypertension    dr t turner  . Hypothyroidism   . Neuropathy   . Osteoporosis   . Parkinson disease ([place])   . PVD (peripheral vascular disease) ([facility])    99% stenosis of left anteiror tibial artery, mod stenosis of left distal SFA and popliteal artery followed by [name]  . RBBB    noted on EKG 2018  . Restless leg   . Shortness of breath    with exertion - chronic   .  Sjogren's disease ([place])   . SOB (shortness of breath)    chronic due to diastolic dysfunction, deconditioning, obesity  . Tremor    Past Surgical History:  Procedure Laterality Date  . ABDOMINAL HYSTERECTOMY    . BACK SURGERY     4 back surgeries,   lumbar fusion  . CARDIAC CATHETERIZATION  2006   normal  . CYSTOSCOPY/URETEROSCOPY/HOLMIUM LASER/STENT PLACEMENT Left 01/06/2019   Procedure: CYSTOSCOPY/RETROGRADE/URETEROSCOPY/HOLMIUM LASER/BASKET RETRIEVAL/STENT PLACEMENT;  Surgeon: Ceasar Mons, MD;  Location: [facility];  Service: Urology;   Laterality: Left;  . EYE SURGERY Bilateral    cateracts  . falls     variious fall, broken wrist,and toes  . FRACTURE SURGERY Right April 2016   Wrist, Pt. fell  . kidney stone removal Left 01/20/2019  . RIGHT/LEFT HEART CATH AND CORONARY ANGIOGRAPHY N/A 02/05/2018   Procedure: RIGHT/LEFT HEART CATH AND CORONARY ANGIOGRAPHY;  Surgeon: Troy Sine, MD;  Location: [facility];  Service: Cardiovascular;  Laterality: N/A;  . SPINAL CORD STIMULATOR INSERTION N/A 09/09/2015   Procedure: LUMBAR SPINAL CORD STIMULATOR INSERTION;  Surgeon: Clydell Hakim, MD;  Location: [facility];  Service: Neurosurgery;  Laterality: N/A;  LUMBAR SPINAL CORD STIMULATOR INSERTION  . SPINE SURGERY  April 2013   Back X's 4  . TOTAL HIP ARTHROPLASTY     right  . WRIST FRACTURE SURGERY Bilateral     Allergies  Allergen Reactions  . Adrenalone   . Lactose Other (See Comments)    abd pain, lactose intolerant  . Morphine Other (See Comments)  . Sulfa Antibiotics Other (See Comments)    Headache, very sick  . Codeine Other (See Comments)    Reaction:  Headaches and nightmares   . Lactose Intolerance (Gi) Nausea And Vomiting  . Latex Rash  . Lyrica [Pregabalin] Swelling and Other (See Comments)    Reaction:  Leg swelling  . Other Other (See Comments)    Pt states that pain medications give her nightmares.    . Plaquenil [Hydroxychloroquine Sulfate] Other (See Comments)    Reaction:  GI upset   . Reglan [Metoclopramide] Other (See Comments)    Reaction:  GI upset   . Requip [Ropinirole Hcl] Other (See Comments)    Reaction:  GI upset   . Septra [Sulfamethoxazole-Trimethoprim] Nausea And Vomiting  . Shellfish Allergy Nausea And Vomiting    Allergies as of 09/11/2019      Reactions   Adrenalone    Lactose Other (See Comments)   abd pain, lactose intolerant   Morphine Other (See Comments)   Sulfa Antibiotics Other (See Comments)   Headache, very sick   Codeine Other (See Comments)   Reaction:   Headaches and nightmares    Lactose Intolerance (gi) Nausea And Vomiting   Latex Rash   Lyrica [pregabalin] Swelling, Other (See Comments)   Reaction:  Leg swelling   Other Other (See Comments)   Pt states that pain medications give her nightmares.     Plaquenil [hydroxychloroquine Sulfate] Other (See Comments)   Reaction:  GI upset    Reglan [metoclopramide] Other (See Comments)   Reaction:  GI upset    Requip [ropinirole Hcl] Other (See Comments)   Reaction:  GI upset    Septra [sulfamethoxazole-trimethoprim] Nausea And Vomiting   Shellfish Allergy Nausea And Vomiting      Medication List       Accurate as of September 11, 2019  4:43 PM. If you have any questions, ask your nurse or doctor.  STOP taking these medications   diclofenac sodium 1 % Gel Commonly known as: VOLTAREN Stopped by: [name], MD   gabapentin 100 MG capsule Commonly known as: NEURONTIN Stopped by: [name], MD   pantoprazole 20 MG tablet Commonly known as: PROTONIX Stopped by: [name], MD     TAKE these medications   acetaminophen 325 MG tablet Commonly known as: TYLENOL Take 650 mg by mouth daily.   albuterol 108 (90 Base) MCG/ACT inhaler Commonly known as: VENTOLIN HFA Inhale 2 puffs into the lungs every 6 (six) hours as needed for wheezing or shortness of breath.   aspirin EC 81 MG tablet Take 81 mg by mouth at bedtime.   CALCIUM+D3 PO Take by mouth daily. 500-200; amt: 1 tablet; oral once a day   carbidopa-levodopa 25-100 MG tablet Commonly known as: SINEMET IR Take 2 tablets by mouth at bedtime. Take 1 tablet daily What changed: Another medication with the same name was changed. Make sure you understand how and when to take each.   carbidopa-levodopa 25-100 MG tablet Commonly known as: SINEMET IR Take 2 tablets by mouth 2 (two) times a day. What changed:   how much to take  when to take this   denosumab 60 MG/ML Soln injection Commonly known as:  PROLIA Inject 60 mg into the skin every 6 (six) months. Administer in upper arm, thigh, or abdomen   docusate sodium 100 MG capsule Commonly known as: COLACE Take 100 mg by mouth at bedtime.   DULoxetine 20 MG capsule Commonly known as: CYMBALTA TAKE (1) CAPSULE DAILY.   eucerin cream Apply 1 application topically daily.   ezetimibe 10 MG tablet Commonly known as: ZETIA TAKE 1 TABLET ONCE DAILY.   fluorouracil 5 % cream Commonly known as: EFUDEX   lansoprazole 30 MG disintegrating tablet Commonly known as: PREVACID SOLUTAB Take 30 mg by mouth daily at 12 noon.   levothyroxine 50 MCG tablet Commonly known as: SYNTHROID Take 50 mcg by mouth daily before breakfast.   metoprolol tartrate 25 MG tablet Commonly known as: LOPRESSOR TAKE 1 TABLET ONCE DAILY.   multivitamin with minerals Tabs tablet Take 1 tablet by mouth daily.   NONFORMULARY OR COMPOUNDED ITEM Peripheral Neuropathy Cream: Bupivacaine 1%, Doxepin 3%, Gabapentin 6%, Pentoxifylline 3%, Topiramate 1% Order faxed to [facility]   nystatin powder Commonly known as: MYCOSTATIN/NYSTOP Apply topically 2 (two) times daily as needed. What changed:   how much to take  reasons to take this   pramipexole 1 MG tablet Commonly known as: MIRAPEX Take 1 mg by mouth 2 (two) times daily.   PreserVision AREDS 2 Chew Chew by mouth. Once a day   QUEtiapine 25 MG tablet Commonly known as: SEROQUEL Take 12.5 mg by mouth. Q12HRS PRN for anxiety, hallucinations   rosuvastatin 20 MG tablet Commonly known as: CRESTOR Take 1 tablet (20 mg total) by mouth daily.   senna-docusate 8.6-50 MG tablet Commonly known as: Senokot-S Take 1 tablet by mouth daily.   sertraline 25 MG tablet Commonly known as: ZOLOFT Take 50 mg by mouth daily.   Slow Fe 142 (45 Fe) MG Tbcr Generic drug: Ferrous Sulfate Take 1 tablet by mouth. Daily   Systane 0.4-0.3 % Soln Generic drug: Polyethyl Glycol-Propyl Glycol Apply 1 drop  to eye 2 (two) times daily as needed. What changed: Another medication with the same name was removed. Continue taking this medication, and follow the directions you see here. Changed by: [name], MD  Vitamin D 50 MCG (2000 UT) Caps Take 2,000 Units by mouth daily.       Review of Systems  Constitutional: Negative.   HENT: Negative.   Respiratory: Negative.   Gastrointestinal: Negative.   Genitourinary: Negative.   Musculoskeletal: Positive for gait problem.  Neurological: Positive for weakness.  Psychiatric/Behavioral: Positive for hallucinations.  All other systems reviewed and are negative.   Immunization History  Administered Date(s) Administered  . Influenza Split 09/09/2013  . Influenza, High Dose Seasonal PF 09/12/2018  . Influenza,inj,Quad PF,6+ Mos 09/11/2016  . Pneumococcal Conjugate-13 04/15/2014  . Pneumococcal Polysaccharide-23 09/28/2005  . Tdap 09/01/2015  . Zoster Recombinat (Shingrix) 06/11/2018, 09/10/2018   Pertinent  Health Maintenance Due  Topic Date Due  . INFLUENZA VACCINE  07/11/2019  . DEXA SCAN  Completed  . PNA vac Low Risk Adult  Completed   Fall Risk  04/23/2019 04/09/2019 10/29/2018 10/27/2018 10/09/2018  Falls in the past year? 0 0 0 1 Yes  Comment - - fell June 2019 - -  Number falls in past yr: 0 0 - 0 1  Injury with Fall? 0 0 - 1 Yes  Comment - - - - -  Risk for fall due to : - - - - -  Risk for fall due to: Comment - - - - -   Functional Status Survey:    Vitals:   09/11/19 1620  BP: 140/78  Pulse: 80  Resp: 20  Temp: (!) 96.4 F (35.8 C)  SpO2: 96%  Weight: 149 lb 9.6 oz (67.9 kg)  Height: 5\' 2"  (1.575 m)   Body mass index is 27.36 kg/m. Physical Exam HENT:     Head: Normocephalic.     Nose: Nose normal.     Mouth/Throat:     Mouth: Mucous membranes are moist.     Pharynx: Oropharynx is clear.  Eyes:     Pupils: Pupils are equal, round, and reactive to light.  Neck:     Musculoskeletal: Neck supple.   Cardiovascular:     Rate and Rhythm: Normal rate.     Heart sounds: Murmur present.  Pulmonary:     Effort: Pulmonary effort is normal. No respiratory distress.     Breath sounds: Normal breath sounds. No wheezing.  Abdominal:     General: Abdomen is flat. Bowel sounds are normal.     Palpations: Abdomen is soft.  Musculoskeletal:        General: No swelling.  Skin:    General: Skin is warm and dry.  Neurological:     Comments:  Patient alert and Oriented. Unable to get up from the chair. Needing Assist to walk with walker Tremor is mild   Psychiatric:        Mood and Affect: Mood normal.        Thought Content: Thought content normal.     Labs reviewed: Recent Labs    01/02/19 1311 05/26/19 0906 07/24/19 09/08/19  NA 138 143 142 144  K 4.7 4.1 4.3 3.5  CL 108 110 110  --   CO2 21* 24 24  --   GLUCOSE 118* 97  --   --   BUN 14 13 12 10   CREATININE 1.34* 0.65 0.7 0.7  CALCIUM 9.5 9.0 9.0  --    Recent Labs    01/02/19 1935 07/14/19 0811 07/24/19 09/08/19  AST 24 21 43* 23  ALT 13 7 5* 12  ALKPHOS 56 56 0.4* 46  BILITOT 0.8 0.3  --   --  PROT 6.4* 6.1 5.8  --   ALBUMIN 3.5 4.3 3.7  --    Recent Labs    01/02/19 1311 05/26/19 0906 09/08/19  WBC 10.8* 7.4 7.6  NEUTROABS  --  4,862  --   HGB 9.6* 9.4* 11.1*  HCT 31.4* 29.8* 34*  MCV 92.1 89.5  --   PLT 288 302 245   Lab Results  Component Value Date   TSH 1.62 09/08/2019   No results found for: HGBA1C Lab Results  Component Value Date   CHOL 118 07/14/2019   HDL 50 07/14/2019   LDLCALC 32 07/14/2019   TRIG 182 (H) 07/14/2019   CHOLHDL 2.4 07/14/2019    Significant Diagnostic Results in last 30 days:  No results found.  Assessment/Plan Parkinson's disease ([place])- Plan:  On Sinemet Follows with neurology Recent worsening .Needing more help with her ADLS And then make a follow-up appointment with neurology  Hallucinations, visual - Plan: She was started on low-dose of Seroquel. Nurses  have been using it every day usually around 2 or 3:00 when patient gets very upset  And tries to leave the facility We will start her on standing dose of Seroquel 12.5 mg Essential hypertension - Plan: On low-dose of Lopressor  Depression, unspecified depression type- Plan:  Continue on Zoloft and Cymbalta Other issues  Hypothyroid Last TSH normal in 7/19 Same dose for now Repeat TSH Osteoporosis - Plan:  Continue on Prolia since 3/16 and calcium Will need Repeat Imaging GERD Will change ot Protonix in facility Hyperlipidemia Continue on Crestor Dyspnea on Exertion Was seen recently by Pulmonologist They said to continue Albuterol BID LE swelling Not on Any diuretic Continue ted hoses for now Constipation On Senna and Colace Precancerous Lesion on Lip Started on 5 Fluorouracil BID for 3 weeks per dermatology    Family/ staff Communication:   Labs/tests ordered:  Total time spent in this patient care encounter was  25_  minutes; greater than 50% of the visit spent counseling patient and staff, reviewing records , Labs and coordinating care for problems addressed at this encounter.

## 2019-09-14 DIAGNOSIS — G20 Parkinson's disease: Secondary | ICD-10-CM | POA: Diagnosis not present

## 2019-09-14 DIAGNOSIS — R2681 Unsteadiness on feet: Secondary | ICD-10-CM | POA: Diagnosis not present

## 2019-09-14 DIAGNOSIS — M25561 Pain in right knee: Secondary | ICD-10-CM | POA: Diagnosis not present

## 2019-09-14 DIAGNOSIS — Z7389 Other problems related to life management difficulty: Secondary | ICD-10-CM | POA: Diagnosis not present

## 2019-09-14 DIAGNOSIS — Z9181 History of falling: Secondary | ICD-10-CM | POA: Diagnosis not present

## 2019-09-14 DIAGNOSIS — M6281 Muscle weakness (generalized): Secondary | ICD-10-CM | POA: Diagnosis not present

## 2019-09-15 DIAGNOSIS — M6281 Muscle weakness (generalized): Secondary | ICD-10-CM | POA: Diagnosis not present

## 2019-09-15 DIAGNOSIS — G20 Parkinson's disease: Secondary | ICD-10-CM | POA: Diagnosis not present

## 2019-09-15 DIAGNOSIS — M25561 Pain in right knee: Secondary | ICD-10-CM | POA: Diagnosis not present

## 2019-09-15 DIAGNOSIS — R2681 Unsteadiness on feet: Secondary | ICD-10-CM | POA: Diagnosis not present

## 2019-09-15 DIAGNOSIS — Z03818 Encounter for observation for suspected exposure to other biological agents ruled out: Secondary | ICD-10-CM | POA: Diagnosis not present

## 2019-09-15 DIAGNOSIS — Z9181 History of falling: Secondary | ICD-10-CM | POA: Diagnosis not present

## 2019-09-15 DIAGNOSIS — Z7389 Other problems related to life management difficulty: Secondary | ICD-10-CM | POA: Diagnosis not present

## 2019-09-16 DIAGNOSIS — G20 Parkinson's disease: Secondary | ICD-10-CM | POA: Diagnosis not present

## 2019-09-16 DIAGNOSIS — Z7389 Other problems related to life management difficulty: Secondary | ICD-10-CM | POA: Diagnosis not present

## 2019-09-16 DIAGNOSIS — M6281 Muscle weakness (generalized): Secondary | ICD-10-CM | POA: Diagnosis not present

## 2019-09-16 DIAGNOSIS — R2681 Unsteadiness on feet: Secondary | ICD-10-CM | POA: Diagnosis not present

## 2019-09-16 DIAGNOSIS — M25561 Pain in right knee: Secondary | ICD-10-CM | POA: Diagnosis not present

## 2019-09-16 DIAGNOSIS — Z9181 History of falling: Secondary | ICD-10-CM | POA: Diagnosis not present

## 2019-09-17 DIAGNOSIS — R2681 Unsteadiness on feet: Secondary | ICD-10-CM | POA: Diagnosis not present

## 2019-09-17 DIAGNOSIS — M6281 Muscle weakness (generalized): Secondary | ICD-10-CM | POA: Diagnosis not present

## 2019-09-17 DIAGNOSIS — Z9181 History of falling: Secondary | ICD-10-CM | POA: Diagnosis not present

## 2019-09-17 DIAGNOSIS — M25561 Pain in right knee: Secondary | ICD-10-CM | POA: Diagnosis not present

## 2019-09-17 DIAGNOSIS — G20 Parkinson's disease: Secondary | ICD-10-CM | POA: Diagnosis not present

## 2019-09-17 DIAGNOSIS — Z7389 Other problems related to life management difficulty: Secondary | ICD-10-CM | POA: Diagnosis not present

## 2019-09-18 DIAGNOSIS — Z9181 History of falling: Secondary | ICD-10-CM | POA: Diagnosis not present

## 2019-09-18 DIAGNOSIS — R2681 Unsteadiness on feet: Secondary | ICD-10-CM | POA: Diagnosis not present

## 2019-09-18 DIAGNOSIS — Z7389 Other problems related to life management difficulty: Secondary | ICD-10-CM | POA: Diagnosis not present

## 2019-09-18 DIAGNOSIS — M25561 Pain in right knee: Secondary | ICD-10-CM | POA: Diagnosis not present

## 2019-09-18 DIAGNOSIS — G20 Parkinson's disease: Secondary | ICD-10-CM | POA: Diagnosis not present

## 2019-09-18 DIAGNOSIS — M6281 Muscle weakness (generalized): Secondary | ICD-10-CM | POA: Diagnosis not present

## 2019-09-21 ENCOUNTER — Encounter: Payer: Self-pay | Admitting: Nurse Practitioner

## 2019-09-21 ENCOUNTER — Non-Acute Institutional Stay (SKILLED_NURSING_FACILITY): Payer: Medicare Other | Admitting: Nurse Practitioner

## 2019-09-21 DIAGNOSIS — E039 Hypothyroidism, unspecified: Secondary | ICD-10-CM | POA: Diagnosis not present

## 2019-09-21 DIAGNOSIS — R269 Unspecified abnormalities of gait and mobility: Secondary | ICD-10-CM | POA: Diagnosis not present

## 2019-09-21 DIAGNOSIS — G20 Parkinson's disease: Secondary | ICD-10-CM | POA: Diagnosis not present

## 2019-09-21 DIAGNOSIS — R441 Visual hallucinations: Secondary | ICD-10-CM | POA: Diagnosis not present

## 2019-09-21 DIAGNOSIS — Z9181 History of falling: Secondary | ICD-10-CM | POA: Diagnosis not present

## 2019-09-21 DIAGNOSIS — F418 Other specified anxiety disorders: Secondary | ICD-10-CM | POA: Diagnosis not present

## 2019-09-21 DIAGNOSIS — M25561 Pain in right knee: Secondary | ICD-10-CM | POA: Diagnosis not present

## 2019-09-21 DIAGNOSIS — I10 Essential (primary) hypertension: Secondary | ICD-10-CM | POA: Diagnosis not present

## 2019-09-21 DIAGNOSIS — M6281 Muscle weakness (generalized): Secondary | ICD-10-CM | POA: Diagnosis not present

## 2019-09-21 DIAGNOSIS — W19XXXA Unspecified fall, initial encounter: Secondary | ICD-10-CM | POA: Diagnosis not present

## 2019-09-21 DIAGNOSIS — R2681 Unsteadiness on feet: Secondary | ICD-10-CM | POA: Diagnosis not present

## 2019-09-21 DIAGNOSIS — Z7389 Other problems related to life management difficulty: Secondary | ICD-10-CM | POA: Diagnosis not present

## 2019-09-21 NOTE — Assessment & Plan Note (Addendum)
Balance, gait issues, continue Sinemet, MiraPex.

## 2019-09-21 NOTE — Assessment & Plan Note (Addendum)
persisted, continue Seroquel.

## 2019-09-21 NOTE — Assessment & Plan Note (Signed)
Blood pressure is controlled, continue Metoprolol. 

## 2019-09-21 NOTE — Progress Notes (Signed)
This encounter was created in error - please disregard.

## 2019-09-21 NOTE — Assessment & Plan Note (Signed)
Continue ambulating with walker, close supervision for safety.  

## 2019-09-21 NOTE — Progress Notes (Signed)
Location:   S[facility] [place] Room Number: 47 Place of Service:  SNF (31) Provider:  Birney Belshe NP  [name], MD  Patient Care Team: [name], MD as PCP - General (Internal Medicine) [name], MD as PCP - Cardiology (Cardiology) [name], MD (Neurosurgery) [name], MD (Neurology) [name], MD as Consulting Physician (Gastroenterology) [name], MD as Consulting Physician (Urology) Shallon Yaklin X, NP as Nurse Practitioner (Internal Medicine)  Extended Emergency Contact Information Primary Emergency Contact: [facility] Address: [address]          [place], [place] [address] [name] of [place] Phone: [phone] Mobile Phone: [phone] Relation: Spouse Secondary Emergency Contact: [name] Mobile Phone: [phone] Relation: Daughter Preferred language: [name] Interpreter needed? No  Code Status: Full Code Goals of care: Advanced Directive information Advanced Directives 09/11/2019  Does Patient Have a Medical Advance Directive? No  Type of Advance Directive -  Does patient want to make changes to medical advance directive? -  Copy of [place] in Chart? -  Would patient like information on creating a medical advance directive? -  Pre-existing out of facility DNR order (yellow form or pink MOST form) -     Chief Complaint  Patient presents with  . Acute Visit    Fall    HPI:  Pt is a 85 y.o. female seen today for an acute visit for fall 09/18/19 when the patient was found sitting on the floor. She was observed restless, fidgety, rummaging in her belongings. Hx of depression/anxiety, on Cymbalta 20mg  qd, Seroquel 12.5mg  qd, and Sertraline 50mg  qd. Parkinon's disease, on MiraPex 1mg  qd, Sinemet 25/100mg  II qhs, I qd. Hypothyroidism, on Levothyroxine 83mcg qd. HTN, blood pressure is controlled on Metoprolol 25mg  qd.    Past Medical History:  Diagnosis Date  .  Anemia   . Anxiety   . Aortic stenosis    moderate by echo 2019  . Arthritis   . Broken arm    right   . Chronic diastolic CHF (congestive heart failure) ([place])   . Family history of adverse reaction to anesthesia    pt. states sister vomits  . Fibromyalgia   . Fracture of thumb 09/01/2015   right  . Fracture, foot 09/01/2015   right  . GERD (gastroesophageal reflux disease)   . H/O hiatal hernia   . History of bronchitis   . History of kidney stones   . Hypercholesterolemia   . Hypertension    dr t turner  . Hypothyroidism   . Neuropathy   . Osteoporosis   . Parkinson disease ([place])   . PVD (peripheral vascular disease) ([facility])    99% stenosis of left anteiror tibial artery, mod stenosis of left distal SFA and popliteal artery followed by [name]  . RBBB    noted on EKG 2018  . Restless leg   . Shortness of breath    with exertion - chronic   . Sjogren's disease ([place])   . SOB (shortness of breath)    chronic due to diastolic dysfunction, deconditioning, obesity  . Tremor    Past Surgical History:  Procedure Laterality Date  . ABDOMINAL HYSTERECTOMY    . BACK SURGERY     4 back surgeries,   lumbar fusion  . CARDIAC CATHETERIZATION  2006   normal  . CYSTOSCOPY/URETEROSCOPY/HOLMIUM LASER/STENT PLACEMENT Left 01/06/2019   Procedure: CYSTOSCOPY/RETROGRADE/URETEROSCOPY/HOLMIUM LASER/BASKET RETRIEVAL/STENT PLACEMENT;  Surgeon: Ceasar Mons, MD;  Location:  [facility];  Service: Urology;  Laterality: Left;  . EYE SURGERY Bilateral    cateracts  . falls     variious fall, broken wrist,and toes  . FRACTURE SURGERY Right April 2016   Wrist, Pt. fell  . kidney stone removal Left 01/20/2019  . RIGHT/LEFT HEART CATH AND CORONARY ANGIOGRAPHY N/A 02/05/2018   Procedure: RIGHT/LEFT HEART CATH AND CORONARY ANGIOGRAPHY;  Surgeon: Troy Sine, MD;  Location: [facility];  Service: Cardiovascular;  Laterality: N/A;  . SPINAL CORD STIMULATOR INSERTION N/A 09/09/2015    Procedure: LUMBAR SPINAL CORD STIMULATOR INSERTION;  Surgeon: Clydell Hakim, MD;  Location: [facility];  Service: Neurosurgery;  Laterality: N/A;  LUMBAR SPINAL CORD STIMULATOR INSERTION  . SPINE SURGERY  April 2013   Back X's 4  . TOTAL HIP ARTHROPLASTY     right  . WRIST FRACTURE SURGERY Bilateral     Allergies  Allergen Reactions  . Adrenalone   . Lactose Other (See Comments)    abd pain, lactose intolerant  . Morphine Other (See Comments)  . Sulfa Antibiotics Other (See Comments)    Headache, very sick  . Codeine Other (See Comments)    Reaction:  Headaches and nightmares   . Lactose Intolerance (Gi) Nausea And Vomiting  . Latex Rash  . Lyrica [Pregabalin] Swelling and Other (See Comments)    Reaction:  Leg swelling  . Other Other (See Comments)    Pt states that pain medications give her nightmares.    . Plaquenil [Hydroxychloroquine Sulfate] Other (See Comments)    Reaction:  GI upset   . Reglan [Metoclopramide] Other (See Comments)    Reaction:  GI upset   . Requip [Ropinirole Hcl] Other (See Comments)    Reaction:  GI upset   . Septra [Sulfamethoxazole-Trimethoprim] Nausea And Vomiting  . Shellfish Allergy Nausea And Vomiting    Allergies as of 09/21/2019      Reactions   Adrenalone    Lactose Other (See Comments)   abd pain, lactose intolerant   Morphine Other (See Comments)   Sulfa Antibiotics Other (See Comments)   Headache, very sick   Codeine Other (See Comments)   Reaction:  Headaches and nightmares    Lactose Intolerance (gi) Nausea And Vomiting   Latex Rash   Lyrica [pregabalin] Swelling, Other (See Comments)   Reaction:  Leg swelling   Other Other (See Comments)   Pt states that pain medications give her nightmares.     Plaquenil [hydroxychloroquine Sulfate] Other (See Comments)   Reaction:  GI upset    Reglan [metoclopramide] Other (See Comments)   Reaction:  GI upset    Requip [ropinirole Hcl] Other (See Comments)   Reaction:  GI upset     Septra [sulfamethoxazole-trimethoprim] Nausea And Vomiting   Shellfish Allergy Nausea And Vomiting      Medication List       Accurate as of September 21, 2019 11:59 PM. If you have any questions, ask your nurse or doctor.        acetaminophen 500 MG tablet Commonly known as: TYLENOL Take 500 mg by mouth. Once A Day - PRN What changed: Another medication with the same name was removed. Continue taking this medication, and follow the directions you see here. Changed by: Ulysess Witz X Jhovany Weidinger, NP   AeroChamber MV inhaler by Other route. Use as instructed as needed   albuterol 108 (90 Base) MCG/ACT inhaler Commonly known as: VENTOLIN HFA Inhale 2 puffs into the lungs every 6 (  six) hours as needed for wheezing or shortness of breath.   aspirin EC 81 MG tablet Take 81 mg by mouth at bedtime.   CALCIUM+D3 PO Take by mouth daily. 500-200; amt: 1 tablet; oral once a day   carbidopa-levodopa 25-100 MG tablet Commonly known as: SINEMET IR Take 2 tablets by mouth at bedtime. What changed: Another medication with the same name was removed. Continue taking this medication, and follow the directions you see here. Changed by: Sheehan Stacey X Aleecia Tapia, NP   carbidopa-levodopa 25-100 MG tablet Commonly known as: SINEMET IR Take 1 tablet by mouth. Once a day What changed: Another medication with the same name was removed. Continue taking this medication, and follow the directions you see here. Changed by: Celestina Gironda X Jessy Calixte, NP   denosumab 60 MG/ML Soln injection Commonly known as: PROLIA Inject 60 mg into the skin every 6 (six) months. Administer in upper arm, thigh, or abdomen   docusate sodium 100 MG capsule Commonly known as: COLACE Take 100 mg by mouth at bedtime.   DULoxetine 20 MG capsule Commonly known as: CYMBALTA TAKE (1) CAPSULE DAILY.   eucerin cream Apply 1 application topically daily.   ezetimibe 10 MG tablet Commonly known as: ZETIA TAKE 1 TABLET ONCE DAILY.   fluorouracil 5 % cream Commonly  known as: EFUDEX   lansoprazole 30 MG disintegrating tablet Commonly known as: PREVACID SOLUTAB Take 30 mg by mouth daily at 12 noon.   levothyroxine 50 MCG tablet Commonly known as: SYNTHROID Take 50 mcg by mouth daily before breakfast.   metoprolol tartrate 25 MG tablet Commonly known as: LOPRESSOR TAKE 1 TABLET ONCE DAILY.   multivitamin with minerals Tabs tablet Take 1 tablet by mouth daily.   NONFORMULARY OR COMPOUNDED ITEM Peripheral Neuropathy Cream: Bupivacaine 1%, Doxepin 3%, Gabapentin 6%, Pentoxifylline 3%, Topiramate 1% Order faxed to [facility]   nystatin powder Commonly known as: MYCOSTATIN/NYSTOP Apply topically 2 (two) times daily as needed. What changed:   how much to take  reasons to take this   pramipexole 1 MG tablet Commonly known as: MIRAPEX Take 1 mg by mouth 2 (two) times daily.   PreserVision AREDS 2 Chew Chew by mouth. Once a day   QUEtiapine 25 MG tablet Commonly known as: SEROQUEL Take 12.5 mg by mouth. Q12HRS PRN for anxiety, hallucinations   rosuvastatin 20 MG tablet Commonly known as: CRESTOR Take 1 tablet (20 mg total) by mouth daily.   senna-docusate 8.6-50 MG tablet Commonly known as: Senokot-S Take 1 tablet by mouth daily.   sertraline 25 MG tablet Commonly known as: ZOLOFT Take 50 mg by mouth daily.   Slow Fe 142 (45 Fe) MG Tbcr Generic drug: Ferrous Sulfate Take 1 tablet by mouth. Daily   Systane 0.4-0.3 % Soln Generic drug: Polyethyl Glycol-Propyl Glycol Apply 1 drop to eye 2 (two) times daily as needed.   Vitamin D 50 MCG (2000 UT) Caps Take 2,000 Units by mouth daily.      ROS was provided with assistance of staff.  Review of Systems  Constitutional: Negative for activity change, appetite change, chills, diaphoresis, fatigue, fever and unexpected weight change.  HENT: Positive for hearing loss. Negative for congestion and voice change.   Respiratory: Negative for cough, shortness of breath and  wheezing.   Cardiovascular: Negative for chest pain, palpitations and leg swelling.  Gastrointestinal: Negative for abdominal distention, abdominal pain, constipation, diarrhea, nausea and vomiting.  Genitourinary: Negative for difficulty urinating, dysuria and urgency.  Musculoskeletal: Positive for back pain  and gait problem.  Skin: Negative for color change.  Neurological: Positive for weakness. Negative for dizziness, speech difficulty and headaches.       Memory lapses.   Psychiatric/Behavioral: Positive for hallucinations. Negative for agitation, behavioral problems and sleep disturbance. The patient is not nervous/anxious.     Immunization History  Administered Date(s) Administered  . Influenza Split 09/09/2013  . Influenza, High Dose Seasonal PF 09/12/2018  . Influenza,inj,Quad PF,6+ Mos 09/11/2016  . Pneumococcal Conjugate-13 04/15/2014  . Pneumococcal Polysaccharide-23 09/28/2005  . Tdap 09/01/2015  . Zoster Recombinat (Shingrix) 06/11/2018, 09/10/2018   Pertinent  Health Maintenance Due  Topic Date Due  . INFLUENZA VACCINE  07/11/2019  . DEXA SCAN  Completed  . PNA vac Low Risk Adult  Completed   Fall Risk  04/23/2019 04/09/2019 10/29/2018 10/27/2018 10/09/2018  Falls in the past year? 0 0 0 1 Yes  Comment - - fell June 2019 - -  Number falls in past yr: 0 0 - 0 1  Injury with Fall? 0 0 - 1 Yes  Comment - - - - -  Risk for fall due to : - - - - -  Risk for fall due to: Comment - - - - -   Functional Status Survey:    Vitals:   09/21/19 1455  BP: 130/70  Pulse: 100  Resp: 20  Temp: (!) 97.4 F (36.3 C)  SpO2: 94%  Weight: 149 lb 9.6 oz (67.9 kg)  Height: 5\' 2"  (1.575 m)   Body mass index is 27.36 kg/m. Physical Exam Vitals signs and nursing note reviewed.  Constitutional:      Appearance: Normal appearance.  HENT:     Head: Normocephalic and atraumatic.     Nose: Nose normal.     Mouth/Throat:     Mouth: Mucous membranes are moist.  Eyes:      Extraocular Movements: Extraocular movements intact.     Conjunctiva/sclera: Conjunctivae normal.     Pupils: Pupils are equal, round, and reactive to light.  Neck:     Musculoskeletal: Normal range of motion and neck supple.  Cardiovascular:     Rate and Rhythm: Normal rate and regular rhythm.     Heart sounds: Murmur present.  Abdominal:     General: Bowel sounds are normal. There is no distension.     Palpations: Abdomen is soft.     Tenderness: There is no abdominal tenderness. There is no right CVA tenderness, left CVA tenderness, guarding or rebound.  Musculoskeletal: Normal range of motion.     Right lower leg: No edema.     Left lower leg: No edema.  Skin:    General: Skin is warm and dry.     Findings: Lesion present.     Comments: Skin lesion lip  Neurological:     General: No focal deficit present.     Mental Status: She is alert. Mental status is at baseline.     Motor: Weakness present.     Coordination: Coordination normal.     Gait: Gait abnormal.     Comments: Oriented to person, place.   Psychiatric:        Mood and Affect: Mood normal.        Behavior: Behavior normal.        Thought Content: Thought content normal.     Labs reviewed: Recent Labs    01/02/19 1311 05/26/19 0906 07/24/19 09/08/19  NA 138 143 142 144  K 4.7 4.1 4.3 3.5  CL  108 110 110  --   CO2 21* 24 24  --   GLUCOSE 118* 97  --   --   BUN 14 13 12 10   CREATININE 1.34* 0.65 0.7 0.7  CALCIUM 9.5 9.0 9.0  --    Recent Labs    01/02/19 1935 07/14/19 0811 07/24/19 09/08/19  AST 24 21 43* 23  ALT 13 7 5* 12  ALKPHOS 56 56 0.4* 46  BILITOT 0.8 0.3  --   --   PROT 6.4* 6.1 5.8  --   ALBUMIN 3.5 4.3 3.7  --    Recent Labs    01/02/19 1311 05/26/19 0906 09/08/19  WBC 10.8* 7.4 7.6  NEUTROABS  --  4,862  --   HGB 9.6* 9.4* 11.1*  HCT 31.4* 29.8* 34*  MCV 92.1 89.5  --   PLT 288 302 245   Lab Results  Component Value Date   TSH 1.62 09/08/2019   No results found for:  HGBA1C Lab Results  Component Value Date   CHOL 118 07/14/2019   HDL 50 07/14/2019   LDLCALC 32 07/14/2019   TRIG 182 (H) 07/14/2019   CHOLHDL 2.4 07/14/2019    Significant Diagnostic Results in last 30 days:  No results found.  Assessment/Plan Fall Lack of safety awareness, increased frailty, gait abnormality/Parkinsonism are contributory, close supervision for safety.   Gait abnormality Continue ambulating with walker, close supervision for safety.   Hallucinations, visual persisted, continue Seroquel.   Anxiety about health Anxious, restless sometime, continue Cymbalta, Sertraline for now.   Parkinson's disease Balance, gait issues, continue Sinemet, MiraPex.   Hypothyroidism Continue Levothyroxine 40mcg qd.   Hypertension Blood pressure is controlled, continue Metoprolol      Family/ staff Communication: plan of care reviewed with the patient and charge nurse.   Labs/tests ordered:  none  Time spend 25 minutes.

## 2019-09-21 NOTE — Assessment & Plan Note (Signed)
Anxious, restless sometime, continue Cymbalta, Sertraline for now.

## 2019-09-21 NOTE — Assessment & Plan Note (Signed)
Continue Levothyroxine 51mcg qd.

## 2019-09-21 NOTE — Assessment & Plan Note (Signed)
Lack of safety awareness, increased frailty, gait abnormality/Parkinsonism are contributory, close supervision for safety.

## 2019-09-22 ENCOUNTER — Encounter: Payer: Self-pay | Admitting: Internal Medicine

## 2019-09-22 ENCOUNTER — Non-Acute Institutional Stay (SKILLED_NURSING_FACILITY): Payer: Medicare Other | Admitting: Internal Medicine

## 2019-09-22 DIAGNOSIS — I5032 Chronic diastolic (congestive) heart failure: Secondary | ICD-10-CM | POA: Diagnosis not present

## 2019-09-22 DIAGNOSIS — R269 Unspecified abnormalities of gait and mobility: Secondary | ICD-10-CM

## 2019-09-22 DIAGNOSIS — F329 Major depressive disorder, single episode, unspecified: Secondary | ICD-10-CM

## 2019-09-22 DIAGNOSIS — M6281 Muscle weakness (generalized): Secondary | ICD-10-CM | POA: Diagnosis not present

## 2019-09-22 DIAGNOSIS — R2681 Unsteadiness on feet: Secondary | ICD-10-CM | POA: Diagnosis not present

## 2019-09-22 DIAGNOSIS — G20 Parkinson's disease: Secondary | ICD-10-CM | POA: Diagnosis not present

## 2019-09-22 DIAGNOSIS — Z7389 Other problems related to life management difficulty: Secondary | ICD-10-CM | POA: Diagnosis not present

## 2019-09-22 DIAGNOSIS — Z9181 History of falling: Secondary | ICD-10-CM | POA: Diagnosis not present

## 2019-09-22 DIAGNOSIS — M25561 Pain in right knee: Secondary | ICD-10-CM | POA: Diagnosis not present

## 2019-09-22 DIAGNOSIS — R441 Visual hallucinations: Secondary | ICD-10-CM | POA: Diagnosis not present

## 2019-09-22 DIAGNOSIS — F32A Depression, unspecified: Secondary | ICD-10-CM

## 2019-09-22 NOTE — Progress Notes (Signed)
Location:    [facility] Home Room Number: 89 Place of Service:  SNF (31) Provider:  [name], MD  [name], MD  Patient Care Team: [name], MD as PCP - General (Internal Medicine) [name], MD as PCP - Cardiology (Cardiology) [name], MD (Neurosurgery) [name], MD (Neurology) [name], MD as Consulting Physician (Gastroenterology) [name], MD as Consulting Physician (Urology) [name], Man X, NP as Nurse Practitioner (Internal Medicine)  Extended Emergency Contact Information Primary Emergency Contact: [facility] Address: [place]          [address], [place] [address] [name] of [place] Phone: [phone] Mobile Phone: [phone] Relation: Spouse Secondary Emergency Contact: [name] Mobile Phone: [phone] Relation: Daughter Preferred language: [name] Interpreter needed? No  Code Status:  Full Code Goals of care: Advanced Directive information Advanced Directives 09/11/2019  Does Patient Have a Medical Advance Directive? No  Type of Advance Directive -  Does patient want to make changes to medical advance directive? -  Copy of [place] in Chart? -  Would patient like information on creating a medical advance directive? -  Pre-existing out of facility DNR order (yellow form or pink MOST form) -     Chief Complaint  Patient presents with  . Acute Visit    Behavior issues    HPI:  Pt is a 85 y.o. female seen today for an acute visit for Behavior Issues and to D/W the husband her status  Patient has history of Parkinson disease with visual hallucinations, peripheral neuropathy, hypothyroid, GERD, osteoporosis, hyperlipidemia, gout, dyspnea on exertion  Patient was living with her [place] Per her husband patient is getting dependent for her ADLs. She was unable to get out of bed and take care of herself without his help. So she  has been moved to SNF now She is having a lot of behavior issues.  Continues to have hallucinations.  Because her husband many times today.  Gets confused very easily and tries to leave the facility as she thinks she has to go back to her apartment Her husband is concerned about her being on Zoloft and Cymbalta two antidepressants He wanted to know if he can taper his Cymbalta off Patient is depressed but she is more depressed because she cannot see her husband due to Covid restrictions.  She also had a lot of issues with facility staff  Past Medical History:  Diagnosis Date  . Anemia   . Anxiety   . Aortic stenosis    moderate by echo 2019  . Arthritis   . Broken arm    right   . Chronic diastolic CHF (congestive heart failure) ([place])   . Family history of adverse reaction to anesthesia    pt. states sister vomits  . Fibromyalgia   . Fracture of thumb 09/01/2015   right  . Fracture, foot 09/01/2015   right  . GERD (gastroesophageal reflux disease)   . H/O hiatal hernia   . History of bronchitis   . History of kidney stones   . Hypercholesterolemia   . Hypertension    dr t turner  . Hypothyroidism   . Neuropathy   . Osteoporosis   . Parkinson disease ([place])   . PVD (peripheral vascular disease) ([facility])    99% stenosis of left anteiror tibial artery, mod stenosis of left distal SFA and popliteal artery followed by [name]  . RBBB  noted on EKG 2018  . Restless leg   . Shortness of breath    with exertion - chronic   . Sjogren's disease ([place])   . SOB (shortness of breath)    chronic due to diastolic dysfunction, deconditioning, obesity  . Tremor    Past Surgical History:  Procedure Laterality Date  . ABDOMINAL HYSTERECTOMY    . BACK SURGERY     4 back surgeries,   lumbar fusion  . CARDIAC CATHETERIZATION  2006   normal  . CYSTOSCOPY/URETEROSCOPY/HOLMIUM LASER/STENT PLACEMENT Left 01/06/2019   Procedure: CYSTOSCOPY/RETROGRADE/URETEROSCOPY/HOLMIUM LASER/BASKET  RETRIEVAL/STENT PLACEMENT;  Surgeon: Ceasar Mons, MD;  Location: [facility];  Service: Urology;  Laterality: Left;  . EYE SURGERY Bilateral    cateracts  . falls     variious fall, broken wrist,and toes  . FRACTURE SURGERY Right April 2016   Wrist, Pt. fell  . kidney stone removal Left 01/20/2019  . RIGHT/LEFT HEART CATH AND CORONARY ANGIOGRAPHY N/A 02/05/2018   Procedure: RIGHT/LEFT HEART CATH AND CORONARY ANGIOGRAPHY;  Surgeon: Troy Sine, MD;  Location: [facility];  Service: Cardiovascular;  Laterality: N/A;  . SPINAL CORD STIMULATOR INSERTION N/A 09/09/2015   Procedure: LUMBAR SPINAL CORD STIMULATOR INSERTION;  Surgeon: Clydell Hakim, MD;  Location: [facility];  Service: Neurosurgery;  Laterality: N/A;  LUMBAR SPINAL CORD STIMULATOR INSERTION  . SPINE SURGERY  April 2013   Back X's 4  . TOTAL HIP ARTHROPLASTY     right  . WRIST FRACTURE SURGERY Bilateral     Allergies  Allergen Reactions  . Adrenalone   . Lactose Other (See Comments)    abd pain, lactose intolerant  . Morphine Other (See Comments)  . Sulfa Antibiotics Other (See Comments)    Headache, very sick  . Codeine Other (See Comments)    Reaction:  Headaches and nightmares   . Lactose Intolerance (Gi) Nausea And Vomiting  . Latex Rash  . Lyrica [Pregabalin] Swelling and Other (See Comments)    Reaction:  Leg swelling  . Other Other (See Comments)    Pt states that pain medications give her nightmares.    . Plaquenil [Hydroxychloroquine Sulfate] Other (See Comments)    Reaction:  GI upset   . Reglan [Metoclopramide] Other (See Comments)    Reaction:  GI upset   . Requip [Ropinirole Hcl] Other (See Comments)    Reaction:  GI upset   . Septra [Sulfamethoxazole-Trimethoprim] Nausea And Vomiting  . Shellfish Allergy Nausea And Vomiting    Allergies as of 09/22/2019      Reactions   Adrenalone    Lactose Other (See Comments)   abd pain, lactose intolerant   Morphine Other (See Comments)    Sulfa Antibiotics Other (See Comments)   Headache, very sick   Codeine Other (See Comments)   Reaction:  Headaches and nightmares    Lactose Intolerance (gi) Nausea And Vomiting   Latex Rash   Lyrica [pregabalin] Swelling, Other (See Comments)   Reaction:  Leg swelling   Other Other (See Comments)   Pt states that pain medications give her nightmares.     Plaquenil [hydroxychloroquine Sulfate] Other (See Comments)   Reaction:  GI upset    Reglan [metoclopramide] Other (See Comments)   Reaction:  GI upset    Requip [ropinirole Hcl] Other (See Comments)   Reaction:  GI upset    Septra [sulfamethoxazole-trimethoprim] Nausea And Vomiting   Shellfish Allergy Nausea And Vomiting      Medication List  Accurate as of September 22, 2019 11:56 AM. If you have any questions, ask your nurse or doctor.        acetaminophen 500 MG tablet Commonly known as: TYLENOL Take 500 mg by mouth. Once A Day - PRN   AeroChamber MV inhaler by Other route. Use as instructed as needed   albuterol 108 (90 Base) MCG/ACT inhaler Commonly known as: VENTOLIN HFA Inhale 2 puffs into the lungs every 6 (six) hours as needed for wheezing or shortness of breath.   aspirin EC 81 MG tablet Take 81 mg by mouth at bedtime.   CALCIUM+D3 PO Take by mouth daily. 500-200; amt: 1 tablet; oral once a day   carbidopa-levodopa 25-100 MG tablet Commonly known as: SINEMET IR Take 2 tablets by mouth at bedtime.   carbidopa-levodopa 25-100 MG tablet Commonly known as: SINEMET IR Take 1 tablet by mouth. Once a day   denosumab 60 MG/ML Soln injection Commonly known as: PROLIA Inject 60 mg into the skin every 6 (six) months. Administer in upper arm, thigh, or abdomen   docusate sodium 100 MG capsule Commonly known as: COLACE Take 100 mg by mouth at bedtime.   DULoxetine 20 MG capsule Commonly known as: CYMBALTA TAKE (1) CAPSULE DAILY.   eucerin cream Apply 1 application topically daily.   ezetimibe 10 MG  tablet Commonly known as: ZETIA TAKE 1 TABLET ONCE DAILY.   fluorouracil 5 % cream Commonly known as: EFUDEX   lansoprazole 30 MG disintegrating tablet Commonly known as: PREVACID SOLUTAB Take 30 mg by mouth daily at 12 noon.   levothyroxine 50 MCG tablet Commonly known as: SYNTHROID Take 50 mcg by mouth daily before breakfast.   metoprolol tartrate 25 MG tablet Commonly known as: LOPRESSOR TAKE 1 TABLET ONCE DAILY.   multivitamin with minerals Tabs tablet Take 1 tablet by mouth daily.   NONFORMULARY OR COMPOUNDED ITEM Peripheral Neuropathy Cream: Bupivacaine 1%, Doxepin 3%, Gabapentin 6%, Pentoxifylline 3%, Topiramate 1% Order faxed to [facility]   nystatin powder Commonly known as: MYCOSTATIN/NYSTOP Apply topically 2 (two) times daily as needed. What changed:   how much to take  reasons to take this   pramipexole 1 MG tablet Commonly known as: MIRAPEX Take 1 mg by mouth 2 (two) times daily.   PreserVision AREDS 2 Chew Chew by mouth. Once a day   QUEtiapine 25 MG tablet Commonly known as: SEROQUEL Take 12.5 mg by mouth. Q12HRS PRN for anxiety, hallucinations   rosuvastatin 20 MG tablet Commonly known as: CRESTOR Take 1 tablet (20 mg total) by mouth daily.   senna-docusate 8.6-50 MG tablet Commonly known as: Senokot-S Take 1 tablet by mouth daily.   sertraline 25 MG tablet Commonly known as: ZOLOFT Take 50 mg by mouth daily.   Slow Fe 142 (45 Fe) MG Tbcr Generic drug: Ferrous Sulfate Take 1 tablet by mouth. Daily   Systane 0.4-0.3 % Soln Generic drug: Polyethyl Glycol-Propyl Glycol Apply 1 drop to eye 2 (two) times daily as needed.   Vitamin D 50 MCG (2000 UT) Caps Take 2,000 Units by mouth daily.       Review of Systems  Constitutional: Positive for activity change.  HENT: Negative.   Respiratory: Negative.   Cardiovascular: Positive for leg swelling.  Gastrointestinal: Positive for constipation.  Genitourinary: Negative.    Musculoskeletal: Positive for gait problem.  Skin: Negative.   Neurological: Positive for weakness.  Psychiatric/Behavioral: Positive for behavioral problems, confusion, dysphoric mood and hallucinations.  All other systems reviewed and are  negative.   Immunization History  Administered Date(s) Administered  . Influenza Split 09/09/2013  . Influenza, High Dose Seasonal PF 09/12/2018  . Influenza,inj,Quad PF,6+ Mos 09/11/2016  . Pneumococcal Conjugate-13 04/15/2014  . Pneumococcal Polysaccharide-23 09/28/2005  . Tdap 09/01/2015  . Zoster Recombinat (Shingrix) 06/11/2018, 09/10/2018   Pertinent  Health Maintenance Due  Topic Date Due  . INFLUENZA VACCINE  07/11/2019  . DEXA SCAN  Completed  . PNA vac Low Risk Adult  Completed   Fall Risk  04/23/2019 04/09/2019 10/29/2018 10/27/2018 10/09/2018  Falls in the past year? 0 0 0 1 Yes  Comment - - fell June 2019 - -  Number falls in past yr: 0 0 - 0 1  Injury with Fall? 0 0 - 1 Yes  Comment - - - - -  Risk for fall due to : - - - - -  Risk for fall due to: Comment - - - - -   Functional Status Survey:    Vitals:   09/22/19 1150  BP: 130/70  Pulse: 100  Resp: 20  Temp: (!) 97.1 F (36.2 C)  SpO2: 91%  Weight: 149 lb 9.6 oz (67.9 kg)  Height: 5\' 2"  (1.575 m)   Body mass index is 27.36 kg/m. Physical Exam Vitals signs reviewed.  Constitutional:      Appearance: Normal appearance.  HENT:     Head: Normocephalic.     Nose: Nose normal.     Mouth/Throat:     Mouth: Mucous membranes are moist.     Pharynx: Oropharynx is clear.  Eyes:     Pupils: Pupils are equal, round, and reactive to light.  Neck:     Musculoskeletal: Neck supple.  Cardiovascular:     Rate and Rhythm: Normal rate and regular rhythm.     Pulses: Normal pulses.  Pulmonary:     Effort: Pulmonary effort is normal.     Breath sounds: Normal breath sounds.  Abdominal:     General: Abdomen is flat. Bowel sounds are normal.     Palpations: Abdomen is  soft.  Musculoskeletal:        General: Swelling present.  Skin:    General: Skin is warm and dry.  Neurological:     Mental Status: She is alert.     Comments: No focal Deficits Gait is very Slow  Psychiatric:        Attention and Perception: Attention normal.        Mood and Affect: Mood is anxious.        Speech: Speech normal.        Behavior: Behavior normal.        Thought Content: Thought content is paranoid.     Labs reviewed: Recent Labs    01/02/19 1311 05/26/19 0906 07/24/19 09/08/19  NA 138 143 142 144  K 4.7 4.1 4.3 3.5  CL 108 110 110  --   CO2 21* 24 24  --   GLUCOSE 118* 97  --   --   BUN 14 13 12 10   CREATININE 1.34* 0.65 0.7 0.7  CALCIUM 9.5 9.0 9.0  --    Recent Labs    01/02/19 1935 07/14/19 0811 07/24/19 09/08/19  AST 24 21 43* 23  ALT 13 7 5* 12  ALKPHOS 56 56 0.4* 46  BILITOT 0.8 0.3  --   --   PROT 6.4* 6.1 5.8  --   ALBUMIN 3.5 4.3 3.7  --    Recent Labs  01/02/19 1311 05/26/19 0906 09/08/19  WBC 10.8* 7.4 7.6  NEUTROABS  --  4,862  --   HGB 9.6* 9.4* 11.1*  HCT 31.4* 29.8* 34*  MCV 92.1 89.5  --   PLT 288 302 245   Lab Results  Component Value Date   TSH 1.62 09/08/2019   No results found for: HGBA1C Lab Results  Component Value Date   CHOL 118 07/14/2019   HDL 50 07/14/2019   LDLCALC 32 07/14/2019   TRIG 182 (H) 07/14/2019   CHOLHDL 2.4 07/14/2019    Significant Diagnostic Results in last 30 days:  No results found.  Assessment/Plan Parkinson's disease ([place]) On Sinemet and Mirepex Follows with neurology Needs for help with her ADLs Her husband is been to make a follow-up appointment with neurology  Hallucinations, visual Will continue Seroquel for now  Depression,  Discontinue Cymbalta Continue on Zoloft  Chronic diastolic CHF (congestive heart failure)   Gait abnormality Continue Supportive Care Hypothyroid Last TSH normal in 7/19 Osteoporosis - Plan:  Continue on Proliasince 3/16and calcium  Will need Repeat Imaging GERD On Prevacid Hyperlipidemia On Crestor and Zetia Dyspnea on Exertion Was seen recently by Pulmonologist They said to continue Albuterol BID LE swelling Not on Any diuretic Continue ted hoses for now Constipation On Senna andColace Precancerous Lesion on Lip Finished 5 Fluoro Uracil and now using Aquaphor  I had a long discussion with her husband and the facility nurse. They will try to control her behavior PRN Seroquel She will probably need regular Seroquel doses. To see her husband.  Facility has decided to let the husband come for an hour in PPE to visit her   Family/ staff Communication:  Labs/tests ordered:   Total time spent in this patient care encounter was  45_  minutes; greater than 50% of the visit spent counseling patient and staff, reviewing records , Labs and coordinating care for problems addressed at this encounter.

## 2019-09-23 DIAGNOSIS — G20 Parkinson's disease: Secondary | ICD-10-CM | POA: Diagnosis not present

## 2019-09-23 DIAGNOSIS — R2681 Unsteadiness on feet: Secondary | ICD-10-CM | POA: Diagnosis not present

## 2019-09-23 DIAGNOSIS — M25561 Pain in right knee: Secondary | ICD-10-CM | POA: Diagnosis not present

## 2019-09-23 DIAGNOSIS — Z9181 History of falling: Secondary | ICD-10-CM | POA: Diagnosis not present

## 2019-09-23 DIAGNOSIS — Z7389 Other problems related to life management difficulty: Secondary | ICD-10-CM | POA: Diagnosis not present

## 2019-09-23 DIAGNOSIS — M6281 Muscle weakness (generalized): Secondary | ICD-10-CM | POA: Diagnosis not present

## 2019-09-24 DIAGNOSIS — Z7389 Other problems related to life management difficulty: Secondary | ICD-10-CM | POA: Diagnosis not present

## 2019-09-24 DIAGNOSIS — G20 Parkinson's disease: Secondary | ICD-10-CM | POA: Diagnosis not present

## 2019-09-24 DIAGNOSIS — M6281 Muscle weakness (generalized): Secondary | ICD-10-CM | POA: Diagnosis not present

## 2019-09-24 DIAGNOSIS — R2681 Unsteadiness on feet: Secondary | ICD-10-CM | POA: Diagnosis not present

## 2019-09-24 DIAGNOSIS — M25561 Pain in right knee: Secondary | ICD-10-CM | POA: Diagnosis not present

## 2019-09-24 DIAGNOSIS — Z9181 History of falling: Secondary | ICD-10-CM | POA: Diagnosis not present

## 2019-09-25 ENCOUNTER — Encounter: Payer: Self-pay | Admitting: Nurse Practitioner

## 2019-09-25 ENCOUNTER — Non-Acute Institutional Stay (SKILLED_NURSING_FACILITY): Payer: Medicare Other | Admitting: Nurse Practitioner

## 2019-09-25 DIAGNOSIS — M25561 Pain in right knee: Secondary | ICD-10-CM | POA: Diagnosis not present

## 2019-09-25 DIAGNOSIS — G20 Parkinson's disease: Secondary | ICD-10-CM

## 2019-09-25 DIAGNOSIS — E039 Hypothyroidism, unspecified: Secondary | ICD-10-CM

## 2019-09-25 DIAGNOSIS — Z9181 History of falling: Secondary | ICD-10-CM | POA: Diagnosis not present

## 2019-09-25 DIAGNOSIS — R413 Other amnesia: Secondary | ICD-10-CM | POA: Diagnosis not present

## 2019-09-25 DIAGNOSIS — F323 Major depressive disorder, single episode, severe with psychotic features: Secondary | ICD-10-CM

## 2019-09-25 DIAGNOSIS — I10 Essential (primary) hypertension: Secondary | ICD-10-CM

## 2019-09-25 DIAGNOSIS — R2681 Unsteadiness on feet: Secondary | ICD-10-CM | POA: Diagnosis not present

## 2019-09-25 DIAGNOSIS — Z7389 Other problems related to life management difficulty: Secondary | ICD-10-CM | POA: Diagnosis not present

## 2019-09-25 DIAGNOSIS — M6281 Muscle weakness (generalized): Secondary | ICD-10-CM | POA: Diagnosis not present

## 2019-09-25 NOTE — Assessment & Plan Note (Signed)
Continue Sinemet, f/u Neurology.

## 2019-09-25 NOTE — Assessment & Plan Note (Signed)
Blood pressure is controlled, continue Metoprolol. 

## 2019-09-25 NOTE — Progress Notes (Signed)
Location:    SNF [place] Room Number: 30 Place of Service:  SNF (31) Provider: Lennie Odor Berklie Dethlefs NP  [name], MD  Patient Care Team: [name], MD as PCP - General (Internal Medicine) [name], MD as PCP - Cardiology (Cardiology) [name], MD (Neurosurgery) [name], MD (Neurology) [name], MD as Consulting Physician (Gastroenterology) [name], MD as Consulting Physician (Urology) [name], NP as Nurse Practitioner (Internal Medicine)  Extended Emergency Contact Information Primary Emergency Contact: [name] Address: [address]          [place], [place] [address] [name] of [place] Phone: [phone] Mobile Phone: [phone] Relation: Spouse Secondary Emergency Contact: [name] Mobile Phone: [phone] Relation: Daughter Preferred language: [name] Interpreter needed? No  Code Status: DNR Goals of care: Advanced Directive information Advanced Directives 09/11/2019  Does Patient Have a Medical Advance Directive? No  Type of Advance Directive -  Does patient want to make changes to medical advance directive? -  Copy of [place] in Chart? -  Would patient like information on creating a medical advance directive? -  Pre-existing out of facility DNR order (yellow form or pink MOST form) -     Chief Complaint  Patient presents with  . Acute Visit    depression, anxiety, agitation    HPI:  Pt is a 85 y.o. female seen today for an acute visit for reported agitation,delusions,  depression, anxiety, off Cymbalta since 09/22/19, on Sertraline 50mg  qd, Seroquel 12.5mg  qd and q12h prn-used 5-6x since 09/10/19. Reported from the patient's daughter the patient said she ould hang herself, lashing out inappropriate to her husband, wanting to go home, combative with CNA, hitting one in the abd, scratched the other. Hx of Parkinson, f/u neurology, on Sinemet 25/100mg  qd,  II qhs. HTN, blood pressure is controlled on Metoprolol 25mg  qd. Hypothyroidism, on Levothyroxine 24mcg qd, TSH 1.62 09/08/19.    Past Medical History:  Diagnosis Date  . Anemia   . Anxiety   . Aortic stenosis    moderate by echo 2019  . Arthritis   . Broken arm    right   . Chronic diastolic CHF (congestive heart failure) ([place])   . Family history of adverse reaction to anesthesia    pt. states sister vomits  . Fibromyalgia   . Fracture of thumb 09/01/2015   right  . Fracture, foot 09/01/2015   right  . GERD (gastroesophageal reflux disease)   . H/O hiatal hernia   . History of bronchitis   . History of kidney stones   . Hypercholesterolemia   . Hypertension    dr t turner  . Hypothyroidism   . Neuropathy   . Osteoporosis   . Parkinson disease ([place])   . PVD (peripheral vascular disease) ([facility])    99% stenosis of left anteiror tibial artery, mod stenosis of left distal SFA and popliteal artery followed by [name]  . RBBB    noted on EKG 2018  . Restless leg   . Shortness of breath    with exertion - chronic   . Sjogren's disease ([place])   . SOB (shortness of breath)    chronic due to diastolic dysfunction, deconditioning, obesity  . Tremor    Past Surgical History:  Procedure Laterality Date  . ABDOMINAL HYSTERECTOMY    . BACK SURGERY     4 back surgeries,   lumbar fusion  . CARDIAC CATHETERIZATION  2006  normal  . CYSTOSCOPY/URETEROSCOPY/HOLMIUM LASER/STENT PLACEMENT Left 01/06/2019   Procedure: CYSTOSCOPY/RETROGRADE/URETEROSCOPY/HOLMIUM LASER/BASKET RETRIEVAL/STENT PLACEMENT;  Surgeon: Ceasar Mons, MD;  Location: [facility];  Service: Urology;  Laterality: Left;  . EYE SURGERY Bilateral    cateracts  . falls     variious fall, broken wrist,and toes  . FRACTURE SURGERY Right April 2016   Wrist, Pt. fell  . kidney stone removal Left 01/20/2019  . RIGHT/LEFT HEART CATH AND CORONARY ANGIOGRAPHY N/A 02/05/2018   Procedure: RIGHT/LEFT HEART CATH AND  CORONARY ANGIOGRAPHY;  Surgeon: Troy Sine, MD;  Location: [facility];  Service: Cardiovascular;  Laterality: N/A;  . SPINAL CORD STIMULATOR INSERTION N/A 09/09/2015   Procedure: LUMBAR SPINAL CORD STIMULATOR INSERTION;  Surgeon: Clydell Hakim, MD;  Location: [place] [facility];  Service: Neurosurgery;  Laterality: N/A;  LUMBAR SPINAL CORD STIMULATOR INSERTION  . SPINE SURGERY  April 2013   Back X's 4  . TOTAL HIP ARTHROPLASTY     right  . WRIST FRACTURE SURGERY Bilateral     Allergies  Allergen Reactions  . Adrenalone   . Lactose Other (See Comments)    abd pain, lactose intolerant  . Morphine Other (See Comments)  . Sulfa Antibiotics Other (See Comments)    Headache, very sick  . Codeine Other (See Comments)    Reaction:  Headaches and nightmares   . Lactose Intolerance (Gi) Nausea And Vomiting  . Latex Rash  . Lyrica [Pregabalin] Swelling and Other (See Comments)    Reaction:  Leg swelling  . Other Other (See Comments)    Pt states that pain medications give her nightmares.    . Plaquenil [Hydroxychloroquine Sulfate] Other (See Comments)    Reaction:  GI upset   . Reglan [Metoclopramide] Other (See Comments)    Reaction:  GI upset   . Requip [Ropinirole Hcl] Other (See Comments)    Reaction:  GI upset   . Septra [Sulfamethoxazole-Trimethoprim] Nausea And Vomiting  . Shellfish Allergy Nausea And Vomiting    Allergies as of 09/25/2019      Reactions   Adrenalone    Lactose Other (See Comments)   abd pain, lactose intolerant   Morphine Other (See Comments)   Sulfa Antibiotics Other (See Comments)   Headache, very sick   Codeine Other (See Comments)   Reaction:  Headaches and nightmares    Lactose Intolerance (gi) Nausea And Vomiting   Latex Rash   Lyrica [pregabalin] Swelling, Other (See Comments)   Reaction:  Leg swelling   Other Other (See Comments)   Pt states that pain medications give her nightmares.     Plaquenil [hydroxychloroquine Sulfate] Other (See  Comments)   Reaction:  GI upset    Reglan [metoclopramide] Other (See Comments)   Reaction:  GI upset    Requip [ropinirole Hcl] Other (See Comments)   Reaction:  GI upset    Septra [sulfamethoxazole-trimethoprim] Nausea And Vomiting   Shellfish Allergy Nausea And Vomiting      Medication List       Accurate as of September 25, 2019  1:49 PM. If you have any questions, ask your nurse or doctor.        STOP taking these medications   DULoxetine 20 MG capsule Commonly known as: CYMBALTA Stopped by: Evanie Buckle X Arnella Pralle, NP     TAKE these medications   acetaminophen 500 MG tablet Commonly known as: TYLENOL Take 500 mg by mouth. Once A Day - PRN   AeroChamber MV inhaler by Other route.  Use as instructed as needed   albuterol 108 (90 Base) MCG/ACT inhaler Commonly known as: VENTOLIN HFA Inhale 2 puffs into the lungs every 6 (six) hours as needed for wheezing or shortness of breath.   aspirin EC 81 MG tablet Take 81 mg by mouth at bedtime.   CALCIUM+D3 PO Take by mouth daily. 500-200; amt: 1 tablet; oral once a day   carbidopa-levodopa 25-100 MG tablet Commonly known as: SINEMET IR Take 2 tablets by mouth at bedtime.   carbidopa-levodopa 25-100 MG tablet Commonly known as: SINEMET IR Take 1 tablet by mouth. Once a day   denosumab 60 MG/ML Soln injection Commonly known as: PROLIA Inject 60 mg into the skin every 6 (six) months. Administer in upper arm, thigh, or abdomen   docusate sodium 100 MG capsule Commonly known as: COLACE Take 100 mg by mouth at bedtime.   eucerin cream Apply 1 application topically daily.   ezetimibe 10 MG tablet Commonly known as: ZETIA TAKE 1 TABLET ONCE DAILY.   lansoprazole 30 MG disintegrating tablet Commonly known as: PREVACID SOLUTAB Take 30 mg by mouth daily at 12 noon.   levothyroxine 50 MCG tablet Commonly known as: SYNTHROID Take 50 mcg by mouth daily before breakfast.   metoprolol tartrate 25 MG tablet Commonly known as:  LOPRESSOR TAKE 1 TABLET ONCE DAILY.   multivitamin with minerals Tabs tablet Take 1 tablet by mouth daily.   NONFORMULARY OR COMPOUNDED ITEM Peripheral Neuropathy Cream: Bupivacaine 1%, Doxepin 3%, Gabapentin 6%, Pentoxifylline 3%, Topiramate 1% Order faxed to [facility]   nystatin powder Commonly known as: MYCOSTATIN/NYSTOP Apply topically 2 (two) times daily as needed. What changed:   how much to take  reasons to take this   pramipexole 1 MG tablet Commonly known as: MIRAPEX Take 1 mg by mouth 2 (two) times daily.   PreserVision AREDS 2 Chew Chew by mouth. Once a day   QUEtiapine 25 MG tablet Commonly known as: SEROQUEL Take 12.5 mg by mouth. Q12HRS PRN for anxiety, hallucinations   QUEtiapine 12.5 mg Tabs tablet Commonly known as: SEROQUEL Take 12.5 mg by mouth 2 (two) times daily. @@ 2pm   rosuvastatin 20 MG tablet Commonly known as: CRESTOR Take 1 tablet (20 mg total) by mouth daily.   senna-docusate 8.6-50 MG tablet Commonly known as: Senokot-S Take 1 tablet by mouth daily.   sertraline 25 MG tablet Commonly known as: ZOLOFT Take 50 mg by mouth daily.   Slow Fe 142 (45 Fe) MG Tbcr Generic drug: Ferrous Sulfate Take 1 tablet by mouth. Daily   Systane 0.4-0.3 % Soln Generic drug: Polyethyl Glycol-Propyl Glycol Apply 1 drop to eye 2 (two) times daily as needed.   Vitamin D 50 MCG (2000 UT) Caps Take 2,000 Units by mouth daily.      ROS was provided with assistance of staff Review of Systems  Constitutional: Negative for activity change, appetite change, chills, diaphoresis, fatigue, fever and unexpected weight change.  HENT: Positive for hearing loss. Negative for congestion and voice change.   Respiratory: Negative for cough, shortness of breath and wheezing.   Cardiovascular: Negative for chest pain and leg swelling.  Gastrointestinal: Negative for abdominal distention, abdominal pain, constipation, diarrhea, nausea and vomiting.   Genitourinary: Negative for difficulty urinating, dysuria and urgency.  Musculoskeletal: Positive for arthralgias, back pain and gait problem.  Skin: Negative for color change and pallor.  Neurological: Negative for dizziness, speech difficulty and headaches.       Memory lapses.   Psychiatric/Behavioral:  Positive for agitation, behavioral problems, dysphoric mood and hallucinations. Negative for sleep disturbance. The patient is nervous/anxious.     Immunization History  Administered Date(s) Administered  . Influenza Split 09/09/2013  . Influenza, High Dose Seasonal PF 09/12/2018  . Influenza,inj,Quad PF,6+ Mos 09/11/2016  . Pneumococcal Conjugate-13 04/15/2014  . Pneumococcal Polysaccharide-23 09/28/2005  . Tdap 09/01/2015  . Zoster Recombinat (Shingrix) 06/11/2018, 09/10/2018   Pertinent  Health Maintenance Due  Topic Date Due  . INFLUENZA VACCINE  07/11/2019  . DEXA SCAN  Completed  . PNA vac Low Risk Adult  Completed   Fall Risk  04/23/2019 04/09/2019 10/29/2018 10/27/2018 10/09/2018  Falls in the past year? 0 0 0 1 Yes  Comment - - fell June 2019 - -  Number falls in past yr: 0 0 - 0 1  Injury with Fall? 0 0 - 1 Yes  Comment - - - - -  Risk for fall due to : - - - - -  Risk for fall due to: Comment - - - - -   Functional Status Survey:    Vitals:   09/25/19 1115  BP: 120/70  Pulse: 68  Temp: 97.6 F (36.4 C)  SpO2: 96%   There is no height or weight on file to calculate BMI. Physical Exam Vitals signs and nursing note reviewed.  Constitutional:      General: She is not in acute distress.    Appearance: Normal appearance. She is not ill-appearing or toxic-appearing.  HENT:     Head: Normocephalic and atraumatic.     Nose: Nose normal.     Mouth/Throat:     Mouth: Mucous membranes are moist.  Eyes:     Extraocular Movements: Extraocular movements intact.     Conjunctiva/sclera: Conjunctivae normal.     Pupils: Pupils are equal, round, and reactive to  light.  Neck:     Musculoskeletal: Normal range of motion and neck supple.  Cardiovascular:     Rate and Rhythm: Normal rate and regular rhythm.     Heart sounds: Murmur present.  Pulmonary:     Breath sounds: No wheezing, rhonchi or rales.  Abdominal:     General: Bowel sounds are normal. There is no distension.     Palpations: Abdomen is soft.     Tenderness: There is no abdominal tenderness. There is no right CVA tenderness, left CVA tenderness, guarding or rebound.  Musculoskeletal:     Right lower leg: No edema.     Left lower leg: No edema.  Skin:    Findings: Lesion present.     Comments: Left lower lip  Neurological:     General: No focal deficit present.     Mental Status: She is alert. Mental status is at baseline.     Cranial Nerves: No cranial nerve deficit.     Motor: No weakness.     Coordination: Coordination normal.     Gait: Gait abnormal.     Comments: Oriented to person, place.   Psychiatric:     Comments: Flat affect     Labs reviewed: Recent Labs    01/02/19 1311 05/26/19 0906 07/24/19 09/08/19  NA 138 143 142 144  K 4.7 4.1 4.3 3.5  CL 108 110 110  --   CO2 21* 24 24  --   GLUCOSE 118* 97  --   --   BUN 14 13 12 10   CREATININE 1.34* 0.65 0.7 0.7  CALCIUM 9.5 9.0 9.0  --  Recent Labs    01/02/19 1935 07/14/19 0811 07/24/19 09/08/19  AST 24 21 43* 23  ALT 13 7 5* 12  ALKPHOS 56 56 0.4* 46  BILITOT 0.8 0.3  --   --   PROT 6.4* 6.1 5.8  --   ALBUMIN 3.5 4.3 3.7  --    Recent Labs    01/02/19 1311 05/26/19 0906 09/08/19  WBC 10.8* 7.4 7.6  NEUTROABS  --  4,862  --   HGB 9.6* 9.4* 11.1*  HCT 31.4* 29.8* 34*  MCV 92.1 89.5  --   PLT 288 302 245   Lab Results  Component Value Date   TSH 1.62 09/08/2019   No results found for: HGBA1C Lab Results  Component Value Date   CHOL 118 07/14/2019   HDL 50 07/14/2019   LDLCALC 32 07/14/2019   TRIG 182 (H) 07/14/2019   CHOLHDL 2.4 07/14/2019    Significant Diagnostic Results in  last 30 days:  No results found.  Assessment/Plan: Depression, psychotic ([place]) Adjustment difficulty, Parkinson's, progressing of dementia are contributing. Dr. Lyndel Safe advised to increase Seroquel to 12.5mg  am, 25mg  pm, continue prn q12h, continue Sertraline 50mg  qd. Observe.   Memory loss Progressing, update MMSE, may consider Memantine to preserve memory.   Parkinson's disease Continue Sinemet, f/u Neurology.   Hypothyroidism Stable, last TSH wnl, continue Levothyroxine 70mcg qd.   Hypertension Blood pressure is controlled, continue Metoprolol.     Family/ staff Communication: plan of care reviewed with the patient and charge nurse.   Labs/tests ordered:  none  Time spend 25 minutes.

## 2019-09-25 NOTE — Assessment & Plan Note (Addendum)
Progressing, update MMSE, may consider Memantine to preserve memory.

## 2019-09-25 NOTE — Assessment & Plan Note (Signed)
Stable, last TSH wnl, continue Levothyroxine 12mcg qd.

## 2019-09-25 NOTE — Assessment & Plan Note (Addendum)
Adjustment difficulty, Parkinson's, progressing of dementia are contributing. Dr. Lyndel Safe advised to increase Seroquel to 12.5mg  am, 25mg  pm, continue prn q12h, continue Sertraline 50mg  qd. Observe.

## 2019-09-28 DIAGNOSIS — R2681 Unsteadiness on feet: Secondary | ICD-10-CM | POA: Diagnosis not present

## 2019-09-28 DIAGNOSIS — M6281 Muscle weakness (generalized): Secondary | ICD-10-CM | POA: Diagnosis not present

## 2019-09-28 DIAGNOSIS — M25561 Pain in right knee: Secondary | ICD-10-CM | POA: Diagnosis not present

## 2019-09-28 DIAGNOSIS — Z9181 History of falling: Secondary | ICD-10-CM | POA: Diagnosis not present

## 2019-09-28 DIAGNOSIS — Z7389 Other problems related to life management difficulty: Secondary | ICD-10-CM | POA: Diagnosis not present

## 2019-09-28 DIAGNOSIS — G20 Parkinson's disease: Secondary | ICD-10-CM | POA: Diagnosis not present

## 2019-09-29 DIAGNOSIS — G20 Parkinson's disease: Secondary | ICD-10-CM | POA: Diagnosis not present

## 2019-09-29 DIAGNOSIS — R2681 Unsteadiness on feet: Secondary | ICD-10-CM | POA: Diagnosis not present

## 2019-09-29 DIAGNOSIS — M6281 Muscle weakness (generalized): Secondary | ICD-10-CM | POA: Diagnosis not present

## 2019-09-29 DIAGNOSIS — Z9181 History of falling: Secondary | ICD-10-CM | POA: Diagnosis not present

## 2019-09-29 DIAGNOSIS — Z7389 Other problems related to life management difficulty: Secondary | ICD-10-CM | POA: Diagnosis not present

## 2019-09-29 DIAGNOSIS — M25561 Pain in right knee: Secondary | ICD-10-CM | POA: Diagnosis not present

## 2019-09-30 DIAGNOSIS — M25561 Pain in right knee: Secondary | ICD-10-CM | POA: Diagnosis not present

## 2019-09-30 DIAGNOSIS — R2681 Unsteadiness on feet: Secondary | ICD-10-CM | POA: Diagnosis not present

## 2019-09-30 DIAGNOSIS — M6281 Muscle weakness (generalized): Secondary | ICD-10-CM | POA: Diagnosis not present

## 2019-09-30 DIAGNOSIS — G20 Parkinson's disease: Secondary | ICD-10-CM | POA: Diagnosis not present

## 2019-09-30 DIAGNOSIS — Z9181 History of falling: Secondary | ICD-10-CM | POA: Diagnosis not present

## 2019-09-30 DIAGNOSIS — Z7389 Other problems related to life management difficulty: Secondary | ICD-10-CM | POA: Diagnosis not present

## 2019-10-01 ENCOUNTER — Non-Acute Institutional Stay (SKILLED_NURSING_FACILITY): Payer: Medicare Other | Admitting: Internal Medicine

## 2019-10-01 ENCOUNTER — Encounter: Payer: Self-pay | Admitting: Internal Medicine

## 2019-10-01 DIAGNOSIS — I5032 Chronic diastolic (congestive) heart failure: Secondary | ICD-10-CM

## 2019-10-01 DIAGNOSIS — F323 Major depressive disorder, single episode, severe with psychotic features: Secondary | ICD-10-CM | POA: Diagnosis not present

## 2019-10-01 DIAGNOSIS — M6281 Muscle weakness (generalized): Secondary | ICD-10-CM | POA: Diagnosis not present

## 2019-10-01 DIAGNOSIS — M810 Age-related osteoporosis without current pathological fracture: Secondary | ICD-10-CM | POA: Diagnosis not present

## 2019-10-01 DIAGNOSIS — R441 Visual hallucinations: Secondary | ICD-10-CM | POA: Diagnosis not present

## 2019-10-01 DIAGNOSIS — R2681 Unsteadiness on feet: Secondary | ICD-10-CM | POA: Diagnosis not present

## 2019-10-01 DIAGNOSIS — M25561 Pain in right knee: Secondary | ICD-10-CM | POA: Diagnosis not present

## 2019-10-01 DIAGNOSIS — R300 Dysuria: Secondary | ICD-10-CM

## 2019-10-01 DIAGNOSIS — G20 Parkinson's disease: Secondary | ICD-10-CM

## 2019-10-01 DIAGNOSIS — Z7389 Other problems related to life management difficulty: Secondary | ICD-10-CM | POA: Diagnosis not present

## 2019-10-01 DIAGNOSIS — Z9181 History of falling: Secondary | ICD-10-CM | POA: Diagnosis not present

## 2019-10-01 NOTE — Progress Notes (Addendum)
Location: [facility] manager of Service:  SNF (31)  Provider:   Code Status:  Goals of Care:  Advanced Directives 09/11/2019  Does Patient Have a Medical Advance Directive? No  Type of Advance Directive -  Does patient want to make changes to medical advance directive? -  Copy of [place] in Chart? -  Would patient like information on creating a medical advance directive? -  Pre-existing out of facility DNR order (yellow form or pink MOST form) -     Chief Complaint  Patient presents with  . Acute Visit    HPI: Patient is a 85 y.o. female seen today for an acute visit for urinary incontinence, Dysuria,Confusion and Depression  Patient has history of Parkinson disease with visual hallucinations, peripheral neuropathy, hypothyroid, GERD, osteoporosis, hyperlipidemia, gout, dyspnea on exertion, LE edema and Moderate Aortic Stenosis  Patient is recently [place] into [place] SNF [place] live [place] husband.  She is having confusion hallucinations and increased help with her ADLs And her husband was not able to take care of her She continued to have behavior issues including trying to exit the facility.  She was started on Seroquel 12. 5 in the morning and 25 at night.  She has improved since then.    But husband says she still stays very confused and has delusions and paranoia about him.  Like he is having an affair and how he continues to work and not take care of her. Patient was seen in her room with her husband.  She states that she feels fine she just feels abandoned by him. She did have complaints of dysuria and urinary incontinent Also working with therapy but having issues with ambulation and Transfers  Past Medical History:  Diagnosis Date  . Anemia   . Anxiety   . Aortic stenosis    moderate by echo 2019  . Arthritis   . Broken arm    right   . Chronic diastolic CHF (congestive heart failure) ([place])   . Family history  of adverse reaction to anesthesia    pt. states sister vomits  . Fibromyalgia   . Fracture of thumb 09/01/2015   right  . Fracture, foot 09/01/2015   right  . GERD (gastroesophageal reflux disease)   . H/O hiatal hernia   . History of bronchitis   . History of kidney stones   . Hypercholesterolemia   . Hypertension    dr t turner  . Hypothyroidism   . Neuropathy   . Osteoporosis   . Parkinson disease ([place])   . PVD (peripheral vascular disease) ([facility])    99% stenosis of left anteiror tibial artery, mod stenosis of left distal SFA and popliteal artery followed by [name]  . RBBB    noted on EKG 2018  . Restless leg   . Shortness of breath    with exertion - chronic   . Sjogren's disease ([place])   . SOB (shortness of breath)    chronic due to diastolic dysfunction, deconditioning, obesity  . Tremor     Past Surgical History:  Procedure Laterality Date  . ABDOMINAL HYSTERECTOMY    . BACK SURGERY     4 back surgeries,   lumbar fusion  . CARDIAC CATHETERIZATION  2006   normal  . CYSTOSCOPY/URETEROSCOPY/HOLMIUM LASER/STENT PLACEMENT Left 01/06/2019   Procedure: CYSTOSCOPY/RETROGRADE/URETEROSCOPY/HOLMIUM LASER/BASKET RETRIEVAL/STENT PLACEMENT;  Surgeon: Ceasar Mons, MD;  Location: [facility];  Service: Urology;  Laterality: Left;  . EYE SURGERY Bilateral    cateracts  . falls     variious fall, broken wrist,and toes  . FRACTURE SURGERY Right April 2016   Wrist, Pt. fell  . kidney stone removal Left 01/20/2019  . RIGHT/LEFT HEART CATH AND CORONARY ANGIOGRAPHY N/A 02/05/2018   Procedure: RIGHT/LEFT HEART CATH AND CORONARY ANGIOGRAPHY;  Surgeon: Troy Sine, MD;  Location: [facility] [place] [facility];  Service: Cardiovascular;  Laterality: N/A;  . SPINAL CORD STIMULATOR INSERTION N/A 09/09/2015   Procedure: LUMBAR SPINAL CORD STIMULATOR INSERTION;  Surgeon: Clydell Hakim, MD;  Location: [facility];  Service: Neurosurgery;  Laterality: N/A;  LUMBAR SPINAL CORD STIMULATOR  INSERTION  . SPINE SURGERY  April 2013   Back X's 4  . TOTAL HIP ARTHROPLASTY     right  . WRIST FRACTURE SURGERY Bilateral     Allergies  Allergen Reactions  . Adrenalone   . Lactose Other (See Comments)    abd pain, lactose intolerant  . Morphine Other (See Comments)  . Sulfa Antibiotics Other (See Comments)    Headache, very sick  . Codeine Other (See Comments)    Reaction:  Headaches and nightmares   . Lactose Intolerance (Gi) Nausea And Vomiting  . Latex Rash  . Lyrica [Pregabalin] Swelling and Other (See Comments)    Reaction:  Leg swelling  . Other Other (See Comments)    Pt states that pain medications give her nightmares.    . Plaquenil [Hydroxychloroquine Sulfate] Other (See Comments)    Reaction:  GI upset   . Reglan [Metoclopramide] Other (See Comments)    Reaction:  GI upset   . Requip [Ropinirole Hcl] Other (See Comments)    Reaction:  GI upset   . Septra [Sulfamethoxazole-Trimethoprim] Nausea And Vomiting  . Shellfish Allergy Nausea And Vomiting    Outpatient Encounter Medications as of 10/01/2019  Medication Sig  . QUEtiapine (SEROQUEL) 25 MG tablet Take 25 mg by mouth at bedtime.  Marland Kitchen acetaminophen (TYLENOL) 500 MG tablet Take 500 mg by mouth. Once A Day - PRN  . albuterol (PROVENTIL HFA;VENTOLIN HFA) 108 (90 Base) MCG/ACT inhaler Inhale 2 puffs into the lungs every 6 (six) hours as needed for wheezing or shortness of breath.  Marland Kitchen aspirin EC 81 MG tablet Take 81 mg by mouth at bedtime.   . Calcium Carb-Cholecalciferol (CALCIUM+D3 PO) Take by mouth daily. 500-200; amt: 1 tablet; oral once a day  . carbidopa-levodopa (SINEMET IR) 25-100 MG tablet Take 2 tablets by mouth at bedtime.  . carbidopa-levodopa (SINEMET IR) 25-100 MG tablet Take 1 tablet by mouth. Once a day  . Cholecalciferol (VITAMIN D) 2000 units CAPS Take 2,000 Units by mouth daily.  Marland Kitchen denosumab (PROLIA) 60 MG/ML SOLN injection Inject 60 mg into the skin every 6 (six) months. Administer in upper  arm, thigh, or abdomen  . docusate sodium (COLACE) 100 MG capsule Take 100 mg by mouth at bedtime.  Marland Kitchen ezetimibe (ZETIA) 10 MG tablet TAKE 1 TABLET ONCE DAILY.  Marland Kitchen Ferrous Sulfate (SLOW FE) 142 (45 Fe) MG TBCR Take 1 tablet by mouth. Daily  . lansoprazole (PREVACID SOLUTAB) 30 MG disintegrating tablet Take 30 mg by mouth daily at 12 noon.  Marland Kitchen levothyroxine (SYNTHROID, LEVOTHROID) 50 MCG tablet Take 50 mcg by mouth daily before breakfast.   . metoprolol tartrate (LOPRESSOR) 25 MG tablet TAKE 1 TABLET ONCE DAILY.  . Multiple Vitamin (MULTIVITAMIN WITH MINERALS) TABS tablet Take 1 tablet by mouth daily.  . Multiple Vitamins-Minerals (  PRESERVISION AREDS 2) CHEW Chew by mouth. Once a day  . NONFORMULARY OR COMPOUNDED ITEM Peripheral Neuropathy Cream: Bupivacaine 1%, Doxepin 3%, Gabapentin 6%, Pentoxifylline 3%, Topiramate 1% Order faxed to [facility]  . nystatin (MYCOSTATIN/NYSTOP) powder Apply topically 2 (two) times daily as needed. (Patient taking differently: Apply 1 g topically 2 (two) times daily as needed (for affected areas under breast). )  . Polyethyl Glycol-Propyl Glycol (SYSTANE) 0.4-0.3 % SOLN Apply 1 drop to eye 2 (two) times daily as needed.  . pramipexole (MIRAPEX) 1 MG tablet Take 1 mg by mouth 2 (two) times daily.  . QUEtiapine (SEROQUEL) 12.5 mg TABS tablet Take 12.5 mg by mouth every morning.   Marland Kitchen QUEtiapine (SEROQUEL) 25 MG tablet Take 12.5 mg by mouth. Q12HRS PRN for anxiety, hallucinations  . rosuvastatin (CRESTOR) 20 MG tablet Take 1 tablet (20 mg total) by mouth daily.  Marland Kitchen senna-docusate (SENOKOT-S) 8.6-50 MG tablet Take 1 tablet by mouth daily.  . sertraline (ZOLOFT) 25 MG tablet Take 50 mg by mouth daily.   . Skin Protectants, Misc. (EUCERIN) cream Apply 1 application topically daily.   Marland Kitchen Spacer/Aero-Holding Chambers (AEROCHAMBER MV) inhaler by Other route. Use as instructed as needed   No facility-administered encounter medications on file as of 10/01/2019.      Review of Systems:  Review of Systems  Constitutional: Positive for appetite change.  HENT: Negative.   Respiratory: Negative.   Cardiovascular: Positive for leg swelling.  Gastrointestinal: Negative.   Genitourinary: Positive for dysuria and urgency.  Musculoskeletal: Negative.   Skin: Negative.   Neurological: Positive for weakness.  Psychiatric/Behavioral: Positive for confusion, dysphoric mood and hallucinations.  All other systems reviewed and are negative.   Health Maintenance  Topic Date Due  . INFLUENZA VACCINE  07/11/2019  . TETANUS/TDAP  08/31/2025  . DEXA SCAN  Completed  . PNA vac Low Risk Adult  Completed    Physical Exam: Vitals:   10/01/19 1941  BP: 140/80  Pulse: 82  Resp: 18  Temp: (!) 97.2 F (36.2 C)  SpO2: 95%  Weight: 149 lb 9.6 oz (67.9 kg)   Body mass index is 27.36 kg/m. Physical Exam Vitals signs reviewed.  Constitutional:      Appearance: Normal appearance.  HENT:     Head: Normocephalic.     Nose: Nose normal.     Mouth/Throat:     Mouth: Mucous membranes are moist.     Pharynx: Oropharynx is clear.  Eyes:     Pupils: Pupils are equal, round, and reactive to light.  Neck:     Musculoskeletal: Neck supple.  Cardiovascular:     Rate and Rhythm: Normal rate.     Heart sounds: Murmur present.  Pulmonary:     Effort: Pulmonary effort is normal.     Breath sounds: Normal breath sounds.  Abdominal:     General: Abdomen is flat. Bowel sounds are normal.     Palpations: Abdomen is soft.  Musculoskeletal:        General: Swelling present.  Skin:    General: Skin is warm and dry.  Neurological:     General: No focal deficit present.     Mental Status: She is alert.     Comments: Patient unable to get out of the chair without support Was walking with the Mild assist with therapy and walker  Psychiatric:        Mood and Affect: Mood normal.        Thought Content: Thought content normal.  Labs reviewed: Basic Metabolic  Panel: Recent Labs    01/02/19 1311 05/26/19 0906 07/24/19 09/08/19  NA 138 143 142 144  K 4.7 4.1 4.3 3.5  CL 108 110 110  --   CO2 21* 24 24  --   GLUCOSE 118* 97  --   --   BUN 14 13 12 10   CREATININE 1.34* 0.65 0.7 0.7  CALCIUM 9.5 9.0 9.0  --   TSH  --   --   --  1.62   Liver Function Tests: Recent Labs    01/02/19 1935 07/14/19 0811 07/24/19 09/08/19  AST 24 21 43* 23  ALT 13 7 5* 12  ALKPHOS 56 56 0.4* 46  BILITOT 0.8 0.3  --   --   PROT 6.4* 6.1 5.8  --   ALBUMIN 3.5 4.3 3.7  --    Recent Labs    01/02/19 1935  LIPASE 26   No results for input(s): AMMONIA in the last 8760 hours. CBC: Recent Labs    01/02/19 1311 05/26/19 0906 09/08/19  WBC 10.8* 7.4 7.6  NEUTROABS  --  4,862  --   HGB 9.6* 9.4* 11.1*  HCT 31.4* 29.8* 34*  MCV 92.1 89.5  --   PLT 288 302 245   Lipid Panel: Recent Labs    07/14/19 0811  CHOL 118  HDL 50  LDLCALC 32  TRIG 182*  CHOLHDL 2.4   No results found for: HGBA1C  Procedures since last visit: No results found.  Assessment/Plan  Parkinson's disease  On Sinemet and Mirapex D/w the Husband She is going to follow up with Neurology Unsteady Gait Working with therapy Patient needs assist now. She is unable to walk by herself. She would need Wheelchair for mobility especially long distance  Hallucinations, visual With Delusions Continue on Seroquel Doing slightly better then before per husband and Nurses  Depression, psychotic ([place]) On Zoloft On Seroquel   Chronic diastolic CHF (congestive heart failure) with LE edema and h/o Aortic stenosis Patient has been on Lasix PRN  Before She will need Lasix again but since she has urinary incontinence will wait  Dysuria Check UA and Culture Has recurrent h/o UTI and was on trimethoprim Also h/o Renal Stones Osteoporosis  On Prolia dont know the start date Due for repeat injection in Dec Repeat DEXA Scan in facility  Labs/tests ordered:  * No order type specified  * Next appt:  10/06/2019 Total time spent in this patient care encounter was  45_  minutes; greater than 50% of the visit spent counseling patient and staff, reviewing records , Labs and coordinating care for problems addressed at this encounter.

## 2019-10-02 DIAGNOSIS — N390 Urinary tract infection, site not specified: Secondary | ICD-10-CM | POA: Diagnosis not present

## 2019-10-02 DIAGNOSIS — M6281 Muscle weakness (generalized): Secondary | ICD-10-CM | POA: Diagnosis not present

## 2019-10-02 DIAGNOSIS — R2681 Unsteadiness on feet: Secondary | ICD-10-CM | POA: Diagnosis not present

## 2019-10-02 DIAGNOSIS — Z7389 Other problems related to life management difficulty: Secondary | ICD-10-CM | POA: Diagnosis not present

## 2019-10-02 DIAGNOSIS — Z9181 History of falling: Secondary | ICD-10-CM | POA: Diagnosis not present

## 2019-10-02 DIAGNOSIS — M25561 Pain in right knee: Secondary | ICD-10-CM | POA: Diagnosis not present

## 2019-10-02 DIAGNOSIS — G20 Parkinson's disease: Secondary | ICD-10-CM | POA: Diagnosis not present

## 2019-10-05 DIAGNOSIS — R2681 Unsteadiness on feet: Secondary | ICD-10-CM | POA: Diagnosis not present

## 2019-10-05 DIAGNOSIS — Z7389 Other problems related to life management difficulty: Secondary | ICD-10-CM | POA: Diagnosis not present

## 2019-10-05 DIAGNOSIS — Z9181 History of falling: Secondary | ICD-10-CM | POA: Diagnosis not present

## 2019-10-05 DIAGNOSIS — M25561 Pain in right knee: Secondary | ICD-10-CM | POA: Diagnosis not present

## 2019-10-05 DIAGNOSIS — G20 Parkinson's disease: Secondary | ICD-10-CM | POA: Diagnosis not present

## 2019-10-05 DIAGNOSIS — M6281 Muscle weakness (generalized): Secondary | ICD-10-CM | POA: Diagnosis not present

## 2019-10-05 DIAGNOSIS — N390 Urinary tract infection, site not specified: Secondary | ICD-10-CM | POA: Diagnosis not present

## 2019-10-06 ENCOUNTER — Other Ambulatory Visit: Payer: Medicare Other

## 2019-10-06 ENCOUNTER — Other Ambulatory Visit: Payer: Self-pay

## 2019-10-06 DIAGNOSIS — E039 Hypothyroidism, unspecified: Secondary | ICD-10-CM

## 2019-10-06 DIAGNOSIS — M25561 Pain in right knee: Secondary | ICD-10-CM | POA: Diagnosis not present

## 2019-10-06 DIAGNOSIS — I10 Essential (primary) hypertension: Secondary | ICD-10-CM

## 2019-10-06 DIAGNOSIS — G20 Parkinson's disease: Secondary | ICD-10-CM | POA: Diagnosis not present

## 2019-10-06 DIAGNOSIS — R2681 Unsteadiness on feet: Secondary | ICD-10-CM | POA: Diagnosis not present

## 2019-10-06 DIAGNOSIS — Z9181 History of falling: Secondary | ICD-10-CM | POA: Diagnosis not present

## 2019-10-06 DIAGNOSIS — D509 Iron deficiency anemia, unspecified: Secondary | ICD-10-CM

## 2019-10-06 DIAGNOSIS — K22719 Barrett's esophagus with dysplasia, unspecified: Secondary | ICD-10-CM

## 2019-10-06 DIAGNOSIS — Z03818 Encounter for observation for suspected exposure to other biological agents ruled out: Secondary | ICD-10-CM | POA: Diagnosis not present

## 2019-10-06 DIAGNOSIS — M6281 Muscle weakness (generalized): Secondary | ICD-10-CM | POA: Diagnosis not present

## 2019-10-06 DIAGNOSIS — M810 Age-related osteoporosis without current pathological fracture: Secondary | ICD-10-CM

## 2019-10-06 DIAGNOSIS — Z7389 Other problems related to life management difficulty: Secondary | ICD-10-CM | POA: Diagnosis not present

## 2019-10-07 ENCOUNTER — Telehealth: Payer: Self-pay

## 2019-10-07 DIAGNOSIS — G20 Parkinson's disease: Secondary | ICD-10-CM | POA: Diagnosis not present

## 2019-10-07 DIAGNOSIS — Z9181 History of falling: Secondary | ICD-10-CM | POA: Diagnosis not present

## 2019-10-07 DIAGNOSIS — M6281 Muscle weakness (generalized): Secondary | ICD-10-CM | POA: Diagnosis not present

## 2019-10-07 DIAGNOSIS — Z7389 Other problems related to life management difficulty: Secondary | ICD-10-CM | POA: Diagnosis not present

## 2019-10-07 DIAGNOSIS — R2681 Unsteadiness on feet: Secondary | ICD-10-CM | POA: Diagnosis not present

## 2019-10-07 DIAGNOSIS — M25561 Pain in right knee: Secondary | ICD-10-CM | POA: Diagnosis not present

## 2019-10-07 NOTE — Telephone Encounter (Signed)
I left message on voicemail for [name] informing him of Dr.Gupta's response

## 2019-10-07 NOTE — Telephone Encounter (Signed)
I have Faxed my Note to [facility] and told them about it. They should have it now hopefully

## 2019-10-07 NOTE — Telephone Encounter (Signed)
[name], PT with [facility] called stating he is getting the run around and needs an order and Dr.Gupta's last OV notes.   I explained to [name] that Dr.Gupta is required to fax her note to the facility and if he needs orders on a skilled patient, that request is handled in house. The facility should have a copy of last ov note.   [name] said he has tried to handle this in house and was told to call here.  [name] is aware I will send message to Dr.Gupta and her medical assistant to correspond with the [facility] nursing staff to get his needs met.

## 2019-10-08 DIAGNOSIS — N390 Urinary tract infection, site not specified: Secondary | ICD-10-CM | POA: Diagnosis not present

## 2019-10-09 ENCOUNTER — Non-Acute Institutional Stay (SKILLED_NURSING_FACILITY): Payer: Medicare Other | Admitting: Nurse Practitioner

## 2019-10-09 ENCOUNTER — Encounter: Payer: Self-pay | Admitting: Nurse Practitioner

## 2019-10-09 ENCOUNTER — Other Ambulatory Visit: Payer: Self-pay

## 2019-10-09 ENCOUNTER — Encounter: Payer: Medicare Other | Admitting: Internal Medicine

## 2019-10-09 DIAGNOSIS — G20 Parkinson's disease: Secondary | ICD-10-CM

## 2019-10-09 DIAGNOSIS — D500 Iron deficiency anemia secondary to blood loss (chronic): Secondary | ICD-10-CM | POA: Diagnosis not present

## 2019-10-09 DIAGNOSIS — Z9181 History of falling: Secondary | ICD-10-CM | POA: Diagnosis not present

## 2019-10-09 DIAGNOSIS — I739 Peripheral vascular disease, unspecified: Secondary | ICD-10-CM

## 2019-10-09 DIAGNOSIS — R2681 Unsteadiness on feet: Secondary | ICD-10-CM | POA: Diagnosis not present

## 2019-10-09 DIAGNOSIS — M25561 Pain in right knee: Secondary | ICD-10-CM | POA: Diagnosis not present

## 2019-10-09 DIAGNOSIS — I10 Essential (primary) hypertension: Secondary | ICD-10-CM | POA: Diagnosis not present

## 2019-10-09 DIAGNOSIS — R413 Other amnesia: Secondary | ICD-10-CM

## 2019-10-09 DIAGNOSIS — Z7389 Other problems related to life management difficulty: Secondary | ICD-10-CM | POA: Diagnosis not present

## 2019-10-09 DIAGNOSIS — E02 Subclinical iodine-deficiency hypothyroidism: Secondary | ICD-10-CM | POA: Diagnosis not present

## 2019-10-09 DIAGNOSIS — F323 Major depressive disorder, single episode, severe with psychotic features: Secondary | ICD-10-CM

## 2019-10-09 DIAGNOSIS — M6281 Muscle weakness (generalized): Secondary | ICD-10-CM | POA: Diagnosis not present

## 2019-10-09 DIAGNOSIS — M5440 Lumbago with sciatica, unspecified side: Secondary | ICD-10-CM | POA: Diagnosis not present

## 2019-10-09 DIAGNOSIS — G8929 Other chronic pain: Secondary | ICD-10-CM

## 2019-10-09 NOTE — Assessment & Plan Note (Signed)
Blood pressure is controlled, continue Metoprolol 25mg qd.  

## 2019-10-09 NOTE — Assessment & Plan Note (Signed)
Stable, prn Tylenol.

## 2019-10-09 NOTE — Assessment & Plan Note (Signed)
Stable, last Hgb 11.1 09/08/19, continue Fe daily.

## 2019-10-09 NOTE — Assessment & Plan Note (Signed)
Unsteady gait, memory and mood issues, continue Sinemet 25/100mg  I qd, II qhs, Mirapex 1mg  bid.

## 2019-10-09 NOTE — Assessment & Plan Note (Addendum)
Her mood is stable, continue Quetiapine 25mg  bid ,12.5mg  q12hr prn, Sertraline 50mg  qd.

## 2019-10-09 NOTE — Assessment & Plan Note (Signed)
Stable, last TSH wnl 1.62 09/08/19, continue Levothyroxine 55mcg qd.

## 2019-10-09 NOTE — Assessment & Plan Note (Signed)
Chronic edema BLE. EF 60-65% 12/11/18 echocardiogram.

## 2019-10-09 NOTE — Assessment & Plan Note (Signed)
Confused, last MMSE 25/30, continue SNF [facility] for safety, care assistance.

## 2019-10-09 NOTE — Progress Notes (Signed)
Location:   [facility] Room Number: 71 Place of Service:  SNF (31)SNF [facility] Provider:  ,  NP  [name], MD  Patient Care Team: [name], MD as PCP - General (Internal Medicine) [name], MD as PCP - Cardiology (Cardiology) [name], MD (Neurosurgery) [name], MD (Neurology) [name], MD as Consulting Physician (Gastroenterology) [name], MD as Consulting Physician (Urology) ,  X, NP as Nurse Practitioner (Internal Medicine)  Extended Emergency Contact Information Primary Emergency Contact: [facility] Address: [address]          [place], [place] [address] [name] of [place] Phone: [phone] Mobile Phone: [phone] Relation: Spouse Secondary Emergency Contact: [name] Mobile Phone: [phone] Relation: Daughter Preferred language: [name] Interpreter needed? No  Code Status:  Full Code Goals of care: Advanced Directive information Advanced Directives 09/11/2019  Does Patient Have a Medical Advance Directive? No  Type of Advance Directive -  Does patient want to make changes to medical advance directive? -  Copy of [place] in Chart? -  Would patient like information on creating a medical advance directive? -  Pre-existing out of facility DNR order (yellow form or pink MOST form) -     Chief Complaint  Patient presents with  . Medical agement of Chronic Issues    HPI:  Pt is a 85 y.o. female seen today for medical management of chronic diseases.    The patient resides in [facility] for safety, care assistance, ambulates with walker. She is confused about time, place, and events. Her mood is stabilizing, Quetiapine 25mg  bid, 12.5mg  q12h prn. Parkinson's, unsteady gait, on Mirapex 1mg  bid, Sinemet 25/100 I qd, II qhs. HTN, blood pressure is controlled with mildly elevated Sbp in 150s, she denied headache, dizziness, change of vision, chest  pain/pressure, or palpitation,  on Metoprolol 25mg  qd. Hypothyroidism, on Levothyroxine 82mcg qd, last TSH 1.62 09/08/19. Anemia, stable, on Fe qd, last Hgb 11.1 09/08/19. Lower back pain, stable.    Past Medical History:  Diagnosis Date  . Anemia   . Anxiety   . Aortic stenosis    moderate by echo 2019  . Arthritis   . Broken arm    right   . Chronic diastolic CHF (congestive heart failure) ([place])   . Family history of adverse reaction to anesthesia    pt. states sister vomits  . Fibromyalgia   . Fracture of thumb 09/01/2015   right  . Fracture, foot 09/01/2015   right  . GERD (gastroesophageal reflux disease)   . H/O hiatal hernia   . History of bronchitis   . History of kidney stones   . Hypercholesterolemia   . Hypertension    dr t turner  . Hypothyroidism   . Neuropathy   . Osteoporosis   . Parkinson disease ([place])   . PVD (peripheral vascular disease) (HCC)    99% stenosis of left anteiror tibial artery, mod stenosis of left distal SFA and popliteal artery followed by [name]  . RBBB    noted on EKG 2018  . Restless leg   . Shortness of breath    with exertion - chronic   . Sjogren's disease ([place])   . SOB (shortness of breath)    chronic due to diastolic dysfunction, deconditioning, obesity  . Tremor    Past Surgical History:  Procedure Laterality Date  . ABDOMINAL HYSTERECTOMY    . BACK SURGERY  4 back surgeries,   lumbar fusion  . CARDIAC CATHETERIZATION  2006   normal  . CYSTOSCOPY/URETEROSCOPY/HOLMIUM LASER/STENT PLACEMENT Left 01/06/2019   Procedure: CYSTOSCOPY/RETROGRADE/URETEROSCOPY/HOLMIUM LASER/BASKET RETRIEVAL/STENT PLACEMENT;  Surgeon: Ceasar Mons, MD;  Location: [facility];  Service: Urology;  Laterality: Left;  . EYE SURGERY Bilateral    cateracts  . falls     variious fall, broken wrist,and toes  . FRACTURE SURGERY Right April 2016   Wrist, Pt. fell  . kidney stone removal Left 01/20/2019  . RIGHT/LEFT HEART CATH AND CORONARY  ANGIOGRAPHY N/A 02/05/2018   Procedure: RIGHT/LEFT HEART CATH AND CORONARY ANGIOGRAPHY;  Surgeon: Troy Sine, MD;  Location: [place] [facility];  Service: Cardiovascular;  Laterality: N/A;  . SPINAL CORD STIMULATOR INSERTION N/A 09/09/2015   Procedure: LUMBAR SPINAL CORD STIMULATOR INSERTION;  Surgeon: Clydell Hakim, MD;  Location: [place] [facility];  Service: Neurosurgery;  Laterality: N/A;  LUMBAR SPINAL CORD STIMULATOR INSERTION  . SPINE SURGERY  April 2013   Back X's 4  . TOTAL HIP ARTHROPLASTY     right  . WRIST FRACTURE SURGERY Bilateral     Allergies  Allergen Reactions  . Adrenalone   . Lactose Other (See Comments)    abd pain, lactose intolerant  . Morphine Other (See Comments)  . Sulfa Antibiotics Other (See Comments)    Headache, very sick  . Codeine Other (See Comments)    Reaction:  Headaches and nightmares   . Lactose Intolerance (Gi) Nausea And Vomiting  . Latex Rash  . Lyrica [Pregabalin] Swelling and Other (See Comments)    Reaction:  Leg swelling  . Other Other (See Comments)    Pt states that pain medications give her nightmares.    . Plaquenil [Hydroxychloroquine Sulfate] Other (See Comments)    Reaction:  GI upset   . Reglan [Metoclopramide] Other (See Comments)    Reaction:  GI upset   . Requip [Ropinirole Hcl] Other (See Comments)    Reaction:  GI upset   . Septra [Sulfamethoxazole-Trimethoprim] Nausea And Vomiting  . Shellfish Allergy Nausea And Vomiting    Allergies as of 10/09/2019      Reactions   Adrenalone    Lactose Other (See Comments)   abd pain, lactose intolerant   Morphine Other (See Comments)   Sulfa Antibiotics Other (See Comments)   Headache, very sick   Codeine Other (See Comments)   Reaction:  Headaches and nightmares    Lactose Intolerance (gi) Nausea And Vomiting   Latex Rash   Lyrica [pregabalin] Swelling, Other (See Comments)   Reaction:  Leg swelling   Other Other (See Comments)   Pt states that pain medications give her  nightmares.     Plaquenil [hydroxychloroquine Sulfate] Other (See Comments)   Reaction:  GI upset    Reglan [metoclopramide] Other (See Comments)   Reaction:  GI upset    Requip [ropinirole Hcl] Other (See Comments)   Reaction:  GI upset    Septra [sulfamethoxazole-trimethoprim] Nausea And Vomiting   Shellfish Allergy Nausea And Vomiting      Medication List       Accurate as of October 09, 2019 11:59 PM. If you have any questions, ask your nurse or doctor.        acetaminophen 500 MG tablet Commonly known as: TYLENOL Take 500 mg by mouth. Once A Day - PRN   AeroChamber MV inhaler by Other route. Use as instructed as needed   albuterol 108 (90 Base) MCG/ACT inhaler Commonly known  as: VENTOLIN HFA Inhale 2 puffs into the lungs every 6 (six) hours as needed for wheezing or shortness of breath.   aspirin EC 81 MG tablet Take 81 mg by mouth at bedtime.   CALCIUM+D3 PO Take by mouth daily. 500-200; amt: 1 tablet; oral once a day   carbidopa-levodopa 25-100 MG tablet Commonly known as: SINEMET IR Take 2 tablets by mouth at bedtime.   carbidopa-levodopa 25-100 MG tablet Commonly known as: SINEMET IR Take 1 tablet by mouth. Once a day   denosumab 60 MG/ML Soln injection Commonly known as: PROLIA Inject 60 mg into the skin every 6 (six) months. Administer in upper arm, thigh, or abdomen   docusate sodium 100 MG capsule Commonly known as: COLACE Take 100 mg by mouth at bedtime.   eucerin cream Apply 1 application topically daily.   ezetimibe 10 MG tablet Commonly known as: ZETIA TAKE 1 TABLET ONCE DAILY.   lansoprazole 30 MG disintegrating tablet Commonly known as: PREVACID SOLUTAB Take 30 mg by mouth daily at 12 noon.   levothyroxine 50 MCG tablet Commonly known as: SYNTHROID Take 50 mcg by mouth daily before breakfast.   metoprolol tartrate 25 MG tablet Commonly known as: LOPRESSOR TAKE 1 TABLET ONCE DAILY.   multivitamin with minerals Tabs tablet Take 1  tablet by mouth daily.   NONFORMULARY OR COMPOUNDED ITEM Peripheral Neuropathy Cream: Bupivacaine 1%, Doxepin 3%, Gabapentin 6%, Pentoxifylline 3%, Topiramate 1% Order faxed to [facility]   nystatin powder Commonly known as: MYCOSTATIN/NYSTOP Apply topically 2 (two) times daily as needed. What changed:   how much to take  reasons to take this   pramipexole 1 MG tablet Commonly known as: MIRAPEX Take 1 mg by mouth 2 (two) times daily.   PreserVision AREDS 2 Chew Chew by mouth. Once a day   QUEtiapine 25 MG tablet Commonly known as: SEROQUEL Take 12.5 mg by mouth. Q12HRS PRN for anxiety, hallucinations   QUEtiapine 12.5 mg Tabs tablet Commonly known as: SEROQUEL Take 12.5 mg by mouth every morning.   QUEtiapine 25 MG tablet Commonly known as: SEROQUEL Take 25 mg by mouth at bedtime.   rosuvastatin 20 MG tablet Commonly known as: CRESTOR Take 1 tablet (20 mg total) by mouth daily.   senna-docusate 8.6-50 MG tablet Commonly known as: Senokot-S Take 1 tablet by mouth daily.   sertraline 25 MG tablet Commonly known as: ZOLOFT Take 50 mg by mouth daily.   Slow Fe 142 (45 Fe) MG Tbcr Generic drug: Ferrous Sulfate Take 1 tablet by mouth. Daily   Systane 0.4-0.3 % Soln Generic drug: Polyethyl Glycol-Propyl Glycol Apply 1 drop to eye 2 (two) times daily as needed.   Vitamin D 50 MCG (2000 UT) Caps Take 2,000 Units by mouth daily.      ROS was provided with assistance of staff.  Review of Systems  Constitutional: Negative for activity change, appetite change, chills, diaphoresis, fatigue, fever and unexpected weight change.  HENT: Positive for hearing loss. Negative for congestion and voice change.   Respiratory: Negative for cough, shortness of breath and wheezing.   Cardiovascular: Positive for leg swelling. Negative for chest pain and palpitations.  Gastrointestinal: Negative for abdominal distention, constipation, diarrhea, nausea and vomiting.   Genitourinary: Negative for difficulty urinating, dysuria and urgency.  Musculoskeletal: Positive for back pain and gait problem.  Neurological: Negative for dizziness, speech difficulty, weakness and headaches.       Memory lapses.   Psychiatric/Behavioral: Positive for confusion and dysphoric mood. Negative for  agitation, behavioral problems, hallucinations and sleep disturbance. The patient is not nervous/anxious.     Immunization History  Administered Date(s) Administered  . Influenza Split 09/09/2013  . Influenza, High Dose Seasonal PF 09/12/2018  . Influenza,inj,Quad PF,6+ Mos 09/11/2016  . Pneumococcal Conjugate-13 04/15/2014  . Pneumococcal Polysaccharide-23 09/28/2005  . Tdap 09/01/2015  . Zoster Recombinat (Shingrix) 06/11/2018, 09/10/2018   Pertinent  Health Maintenance Due  Topic Date Due  . INFLUENZA VACCINE  07/11/2019  . DEXA SCAN  Completed  . PNA vac Low Risk Adult  Completed   Fall Risk  04/23/2019 04/09/2019 10/29/2018 10/27/2018 10/09/2018  Falls in the past year? 0 0 0 1 Yes  Comment - - fell June 2019 - -  Number falls in past yr: 0 0 - 0 1  Injury with Fall? 0 0 - 1 Yes  Comment - - - - -  Risk for fall due to : - - - - -  Risk for fall due to: Comment - - - - -   Functional Status Survey:    Vitals:   10/09/19 1409  BP: (!) 150/70  Pulse: 72  Resp: 18  Temp: (!) 97.3 F (36.3 C)  SpO2: 94%  Weight: 149 lb 9.6 oz (67.9 kg)  Height: 5\' 2"  (1.575 m)   Body mass index is 27.36 kg/m. Physical Exam Vitals signs and nursing note reviewed.  Constitutional:      General: She is not in acute distress.    Appearance: Normal appearance. She is not ill-appearing, toxic-appearing or diaphoretic.     Comments: Over weight  HENT:     Head: Normocephalic.     Nose: Nose normal.     Mouth/Throat:     Mouth: Mucous membranes are moist.  Eyes:     Extraocular Movements: Extraocular movements intact.     Conjunctiva/sclera: Conjunctivae normal.      Pupils: Pupils are equal, round, and reactive to light.  Neck:     Musculoskeletal: Normal range of motion and neck supple.  Cardiovascular:     Rate and Rhythm: Normal rate and regular rhythm.     Heart sounds: Murmur present.  Pulmonary:     Breath sounds: No wheezing, rhonchi or rales.  Abdominal:     General: Bowel sounds are normal. There is no distension.     Palpations: Abdomen is soft.     Tenderness: There is no abdominal tenderness. There is no right CVA tenderness, left CVA tenderness, guarding or rebound.  Musculoskeletal:     Right lower leg: Edema present.     Left lower leg: Edema present.     Comments: 1+ edema BLE, ambulates with walker.   Skin:    General: Skin is warm and dry.  Neurological:     General: No focal deficit present.     Mental Status: She is alert. Mental status is at baseline.     Cranial Nerves: No cranial nerve deficit.     Motor: No weakness.     Coordination: Coordination normal.     Gait: Gait abnormal.  Psychiatric:        Mood and Affect: Mood normal.        Behavior: Behavior normal.     Labs reviewed: Recent Labs    01/02/19 1311 05/26/19 0906 07/24/19 09/08/19  NA 138 143 142 144  K 4.7 4.1 4.3 3.5  CL 108 110 110  --   CO2 21* 24 24  --   GLUCOSE 118* 97  --   --  BUN 14 13 12 10   CREATININE 1.34* 0.65 0.7 0.7  CALCIUM 9.5 9.0 9.0  --    Recent Labs    01/02/19 1935 07/14/19 0811 07/24/19 09/08/19  AST 24 21 43* 23  ALT 13 7 5* 12  ALKPHOS 56 56 0.4* 46  BILITOT 0.8 0.3  --   --   PROT 6.4* 6.1 5.8  --   ALBUMIN 3.5 4.3 3.7  --    Recent Labs    01/02/19 1311 05/26/19 0906 09/08/19  WBC 10.8* 7.4 7.6  NEUTROABS  --  4,862  --   HGB 9.6* 9.4* 11.1*  HCT 31.4* 29.8* 34*  MCV 92.1 89.5  --   PLT 288 302 245   Lab Results  Component Value Date   TSH 1.62 09/08/2019   No results found for: HGBA1C Lab Results  Component Value Date   CHOL 118 07/14/2019   HDL 50 07/14/2019   LDLCALC 32 07/14/2019    TRIG 182 (H) 07/14/2019   CHOLHDL 2.4 07/14/2019    Significant Diagnostic Results in last 30 days:  No results found.  Assessment/Plan Chronic back pain Stable, prn Tylenol.   Depression, psychotic ([place]) Her mood is stable, continue Quetiapine 25mg  bid ,12.5mg  q12hr prn, Sertraline 50mg  qd.   Iron deficiency anemia Stable, last Hgb 11.1 09/08/19, continue Fe daily.   Memory loss Confused, last MMSE 25/30, continue SNF [facility] for safety, care assistance.   Parkinson's disease Unsteady gait, memory and mood issues, continue Sinemet 25/100mg  I qd, II qhs, Mirapex 1mg  bid.   Hypothyroidism Stable, last TSH wnl 1.62 09/08/19, continue Levothyroxine 11mcg qd.   Hypertension Blood pressure is controlled, continue Metoprolol 25mg  qd.   PVD (peripheral vascular disease) (HCC) Chronic edema BLE. EF 60-65% 12/11/18 echocardiogram.      Family/ staff Communication: plan of care reviewed with the patient and charge nurse.   Labs/tests ordered: pending UA C/S  Time spend 25 minutes.

## 2019-10-12 ENCOUNTER — Encounter: Payer: Self-pay | Admitting: Nurse Practitioner

## 2019-10-12 ENCOUNTER — Non-Acute Institutional Stay (SKILLED_NURSING_FACILITY): Payer: Medicare Other | Admitting: Nurse Practitioner

## 2019-10-12 DIAGNOSIS — N3000 Acute cystitis without hematuria: Secondary | ICD-10-CM | POA: Diagnosis not present

## 2019-10-12 DIAGNOSIS — N390 Urinary tract infection, site not specified: Secondary | ICD-10-CM | POA: Insufficient documentation

## 2019-10-12 DIAGNOSIS — R2681 Unsteadiness on feet: Secondary | ICD-10-CM | POA: Diagnosis not present

## 2019-10-12 DIAGNOSIS — M6281 Muscle weakness (generalized): Secondary | ICD-10-CM | POA: Diagnosis not present

## 2019-10-12 DIAGNOSIS — Z7389 Other problems related to life management difficulty: Secondary | ICD-10-CM | POA: Diagnosis not present

## 2019-10-12 DIAGNOSIS — I10 Essential (primary) hypertension: Secondary | ICD-10-CM | POA: Diagnosis not present

## 2019-10-12 DIAGNOSIS — R29898 Other symptoms and signs involving the musculoskeletal system: Secondary | ICD-10-CM | POA: Diagnosis not present

## 2019-10-12 DIAGNOSIS — G20 Parkinson's disease: Secondary | ICD-10-CM | POA: Diagnosis not present

## 2019-10-12 DIAGNOSIS — R41841 Cognitive communication deficit: Secondary | ICD-10-CM | POA: Diagnosis not present

## 2019-10-12 DIAGNOSIS — Z20828 Contact with and (suspected) exposure to other viral communicable diseases: Secondary | ICD-10-CM | POA: Diagnosis not present

## 2019-10-12 DIAGNOSIS — R278 Other lack of coordination: Secondary | ICD-10-CM | POA: Diagnosis not present

## 2019-10-12 NOTE — Progress Notes (Signed)
Location:   SNF [place] Room Number: 20 Place of Service:  SNF (31) Provider: Lennie Odor Wadie Mattie NP  [name], MD  Patient Care Team: [name], MD as PCP - General (Internal Medicine) [name], MD as PCP - Cardiology (Cardiology) [name], MD (Neurosurgery) [name], MD (Neurology) [name], MD as Consulting Physician (Gastroenterology) [name], MD as Consulting Physician (Urology) Gabrelle Roca X, NP as Nurse Practitioner (Internal Medicine)  Extended Emergency Contact Information Primary Emergency Contact: [facility] Address: [address]          [place], [place] [address] [name] of [place] Phone: [phone] Mobile Phone: [phone] Relation: Spouse Secondary Emergency Contact: [name] Mobile Phone: [phone] Relation: Daughter Preferred language: [name] Interpreter needed? No  Code Status: DNR Goals of care: Advanced Directive information Advanced Directives 09/11/2019  Does Patient Have a Medical Advance Directive? No  Type of Advance Directive -  Does patient want to make changes to medical advance directive? -  Copy of [place] in Chart? -  Would patient like information on creating a medical advance directive? -  Pre-existing out of facility DNR order (yellow form or pink MOST form) -     Chief Complaint  Patient presents with  . Acute Visit    UTI    HPI:  Pt is a 85 y.o. female seen today for an acute visit for dysuria, urinary frequency, urinary urgency, improved slightly since Cipro started 10/11/19. HPI was provided with assistance of staff, the patient resides in SNF [facility] for safety, care assistance. Parkinson's, unsteady gait, on Mirapex 1mg  bid, Sinemet 25/100mg  I qd, II qhs. HTN, blood pressure is controlled, on Metoprolol 25mg  qd.    Past Medical History:  Diagnosis Date  . Anemia   . Anxiety   . Aortic stenosis    moderate by echo 2019   . Arthritis   . Broken arm    right   . Chronic diastolic CHF (congestive heart failure) ([place])   . Family history of adverse reaction to anesthesia    pt. states sister vomits  . Fibromyalgia   . Fracture of thumb 09/01/2015   right  . Fracture, foot 09/01/2015   right  . GERD (gastroesophageal reflux disease)   . H/O hiatal hernia   . History of bronchitis   . History of kidney stones   . Hypercholesterolemia   . Hypertension    dr t turner  . Hypothyroidism   . Neuropathy   . Osteoporosis   . Parkinson disease ([place])   . PVD (peripheral vascular disease) ([facility])    99% stenosis of left anteiror tibial artery, mod stenosis of left distal SFA and popliteal artery followed by [name]  . RBBB    noted on EKG 2018  . Restless leg   . Shortness of breath    with exertion - chronic   . Sjogren's disease ([place])   . SOB (shortness of breath)    chronic due to diastolic dysfunction, deconditioning, obesity  . Tremor    Past Surgical History:  Procedure Laterality Date  . ABDOMINAL HYSTERECTOMY    . BACK SURGERY     4 back surgeries,   lumbar fusion  . CARDIAC CATHETERIZATION  2006   normal  . CYSTOSCOPY/URETEROSCOPY/HOLMIUM LASER/STENT PLACEMENT Left 01/06/2019   Procedure: CYSTOSCOPY/RETROGRADE/URETEROSCOPY/HOLMIUM LASER/BASKET RETRIEVAL/STENT PLACEMENT;  Surgeon: Ceasar Mons, MD;  Location: [facility];  Service: Urology;  Laterality: Left;  . EYE  SURGERY Bilateral    cateracts  . falls     variious fall, broken wrist,and toes  . FRACTURE SURGERY Right April 2016   Wrist, Pt. fell  . kidney stone removal Left 01/20/2019  . RIGHT/LEFT HEART CATH AND CORONARY ANGIOGRAPHY N/A 02/05/2018   Procedure: RIGHT/LEFT HEART CATH AND CORONARY ANGIOGRAPHY;  Surgeon: Troy Sine, MD;  Location: [facility];  Service: Cardiovascular;  Laterality: N/A;  . SPINAL CORD STIMULATOR INSERTION N/A 09/09/2015   Procedure: LUMBAR SPINAL CORD STIMULATOR INSERTION;  Surgeon: Clydell Hakim, MD;  Location: [facility];  Service: Neurosurgery;  Laterality: N/A;  LUMBAR SPINAL CORD STIMULATOR INSERTION  . SPINE SURGERY  April 2013   Back X's 4  . TOTAL HIP ARTHROPLASTY     right  . WRIST FRACTURE SURGERY Bilateral     Allergies  Allergen Reactions  . Adrenalone   . Lactose Other (See Comments)    abd pain, lactose intolerant  . Morphine Other (See Comments)  . Sulfa Antibiotics Other (See Comments)    Headache, very sick  . Codeine Other (See Comments)    Reaction:  Headaches and nightmares   . Lactose Intolerance (Gi) Nausea And Vomiting  . Latex Rash  . Lyrica [Pregabalin] Swelling and Other (See Comments)    Reaction:  Leg swelling  . Other Other (See Comments)    Pt states that pain medications give her nightmares.    . Plaquenil [Hydroxychloroquine Sulfate] Other (See Comments)    Reaction:  GI upset   . Reglan [Metoclopramide] Other (See Comments)    Reaction:  GI upset   . Requip [Ropinirole Hcl] Other (See Comments)    Reaction:  GI upset   . Septra [Sulfamethoxazole-Trimethoprim] Nausea And Vomiting  . Shellfish Allergy Nausea And Vomiting    Allergies as of 10/12/2019      Reactions   Adrenalone    Lactose Other (See Comments)   abd pain, lactose intolerant   Morphine Other (See Comments)   Sulfa Antibiotics Other (See Comments)   Headache, very sick   Codeine Other (See Comments)   Reaction:  Headaches and nightmares    Lactose Intolerance (gi) Nausea And Vomiting   Latex Rash   Lyrica [pregabalin] Swelling, Other (See Comments)   Reaction:  Leg swelling   Other Other (See Comments)   Pt states that pain medications give her nightmares.     Plaquenil [hydroxychloroquine Sulfate] Other (See Comments)   Reaction:  GI upset    Reglan [metoclopramide] Other (See Comments)   Reaction:  GI upset    Requip [ropinirole Hcl] Other (See Comments)   Reaction:  GI upset    Septra [sulfamethoxazole-trimethoprim] Nausea And Vomiting    Shellfish Allergy Nausea And Vomiting      Medication List       Accurate as of October 12, 2019 11:59 PM. If you have any questions, ask your nurse or doctor.        acetaminophen 500 MG tablet Commonly known as: TYLENOL Take 500 mg by mouth. Once A Day - PRN   AeroChamber MV inhaler by Other route. Use as instructed as needed   albuterol 108 (90 Base) MCG/ACT inhaler Commonly known as: VENTOLIN HFA Inhale 2 puffs into the lungs every 6 (six) hours as needed for wheezing or shortness of breath.   aspirin EC 81 MG tablet Take 81 mg by mouth at bedtime.   CALCIUM+D3 PO Take by mouth daily. 500-200; amt: 1 tablet; oral once  a day   carbidopa-levodopa 25-100 MG tablet Commonly known as: SINEMET IR Take 2 tablets by mouth at bedtime.   carbidopa-levodopa 25-100 MG tablet Commonly known as: SINEMET IR Take 1 tablet by mouth. Once a day   denosumab 60 MG/ML Soln injection Commonly known as: PROLIA Inject 60 mg into the skin every 6 (six) months. Administer in upper arm, thigh, or abdomen   docusate sodium 100 MG capsule Commonly known as: COLACE Take 100 mg by mouth at bedtime.   eucerin cream Apply 1 application topically daily.   ezetimibe 10 MG tablet Commonly known as: ZETIA TAKE 1 TABLET ONCE DAILY.   lansoprazole 30 MG disintegrating tablet Commonly known as: PREVACID SOLUTAB Take 30 mg by mouth daily at 12 noon.   levothyroxine 50 MCG tablet Commonly known as: SYNTHROID Take 50 mcg by mouth daily before breakfast.   metoprolol tartrate 25 MG tablet Commonly known as: LOPRESSOR TAKE 1 TABLET ONCE DAILY.   multivitamin with minerals Tabs tablet Take 1 tablet by mouth daily.   NONFORMULARY OR COMPOUNDED ITEM Peripheral Neuropathy Cream: Bupivacaine 1%, Doxepin 3%, Gabapentin 6%, Pentoxifylline 3%, Topiramate 1% Order faxed to [facility]   nystatin powder Commonly known as: MYCOSTATIN/NYSTOP Apply topically 2 (two) times daily as needed.  What changed:   how much to take  reasons to take this   pramipexole 1 MG tablet Commonly known as: MIRAPEX Take 1 mg by mouth 2 (two) times daily.   PreserVision AREDS 2 Chew Chew by mouth. Once a day   QUEtiapine 25 MG tablet Commonly known as: SEROQUEL Take 12.5 mg by mouth. Q12HRS PRN for anxiety, hallucinations   QUEtiapine 12.5 mg Tabs tablet Commonly known as: SEROQUEL Take 12.5 mg by mouth every morning.   QUEtiapine 25 MG tablet Commonly known as: SEROQUEL Take 25 mg by mouth at bedtime.   rosuvastatin 20 MG tablet Commonly known as: CRESTOR Take 1 tablet (20 mg total) by mouth daily.   senna-docusate 8.6-50 MG tablet Commonly known as: Senokot-S Take 1 tablet by mouth daily.   sertraline 25 MG tablet Commonly known as: ZOLOFT Take 50 mg by mouth daily.   Slow Fe 142 (45 Fe) MG Tbcr Generic drug: Ferrous Sulfate Take 1 tablet by mouth. Daily   Systane 0.4-0.3 % Soln Generic drug: Polyethyl Glycol-Propyl Glycol Apply 1 drop to eye 2 (two) times daily as needed.   Vitamin D 50 MCG (2000 UT) Caps Take 2,000 Units by mouth daily.      ROS was provided with assistance of staff.  Review of Systems  Constitutional: Negative for activity change, appetite change, chills, diaphoresis, fatigue and fever.  HENT: Positive for hearing loss. Negative for congestion and voice change.   Respiratory: Negative for cough, shortness of breath and wheezing.   Cardiovascular: Positive for leg swelling. Negative for chest pain and palpitations.  Gastrointestinal: Negative for abdominal distention, abdominal pain, constipation, diarrhea, nausea and vomiting.  Genitourinary: Positive for dysuria, frequency and urgency. Negative for difficulty urinating.  Musculoskeletal: Positive for back pain and gait problem.  Skin: Negative for color change and pallor.  Neurological: Positive for headaches. Negative for dizziness, speech difficulty and weakness.       Memory lapses.    Psychiatric/Behavioral: Positive for hallucinations. Negative for agitation, behavioral problems and sleep disturbance. The patient is nervous/anxious.     Immunization History  Administered Date(s) Administered  . Influenza Split 09/09/2013  . Influenza, High Dose Seasonal PF 09/12/2018  . Influenza,inj,Quad PF,6+ Mos 09/11/2016  .  Pneumococcal Conjugate-13 04/15/2014  . Pneumococcal Polysaccharide-23 09/28/2005  . Tdap 09/01/2015  . Zoster Recombinat (Shingrix) 06/11/2018, 09/10/2018   Pertinent  Health Maintenance Due  Topic Date Due  . INFLUENZA VACCINE  07/11/2019  . DEXA SCAN  Completed  . PNA vac Low Risk Adult  Completed   Fall Risk  04/23/2019 04/09/2019 10/29/2018 10/27/2018 10/09/2018  Falls in the past year? 0 0 0 1 Yes  Comment - - fell June 2019 - -  Number falls in past yr: 0 0 - 0 1  Injury with Fall? 0 0 - 1 Yes  Comment - - - - -  Risk for fall due to : - - - - -  Risk for fall due to: Comment - - - - -   Functional Status Survey:    Vitals:   10/12/19 1146  BP: 130/70  Pulse: 82  Resp: 20  Temp: 97.8 F (36.6 C)  SpO2: 94%   There is no height or weight on file to calculate BMI. Physical Exam Constitutional:      General: She is not in acute distress.    Appearance: Normal appearance. She is not toxic-appearing or diaphoretic.  HENT:     Head: Normocephalic and atraumatic.     Nose: Nose normal.     Mouth/Throat:     Mouth: Mucous membranes are moist.  Eyes:     Extraocular Movements: Extraocular movements intact.     Conjunctiva/sclera: Conjunctivae normal.     Pupils: Pupils are equal, round, and reactive to light.  Neck:     Musculoskeletal: Normal range of motion and neck supple.  Cardiovascular:     Rate and Rhythm: Normal rate and regular rhythm.     Heart sounds: Murmur present.  Pulmonary:     Breath sounds: No wheezing, rhonchi or rales.  Abdominal:     General: Bowel sounds are normal. There is no distension.     Palpations:  Abdomen is soft.     Tenderness: There is no abdominal tenderness. There is no right CVA tenderness, left CVA tenderness, guarding or rebound.  Musculoskeletal:     Right lower leg: Edema present.     Left lower leg: Edema present.     Comments: Trace edema BLE  Skin:    General: Skin is warm and dry.  Neurological:     General: No focal deficit present.     Mental Status: She is alert. Mental status is at baseline.     Cranial Nerves: No cranial nerve deficit.     Motor: No weakness.     Coordination: Coordination abnormal.     Gait: Gait abnormal.     Comments: Oriented to person, place.   Psychiatric:        Mood and Affect: Mood normal.        Behavior: Behavior normal.     Labs reviewed: Recent Labs    01/02/19 1311 05/26/19 0906 07/24/19 09/08/19  NA 138 143 142 144  K 4.7 4.1 4.3 3.5  CL 108 110 110  --   CO2 21* 24 24  --   GLUCOSE 118* 97  --   --   BUN 14 13 12 10   CREATININE 1.34* 0.65 0.7 0.7  CALCIUM 9.5 9.0 9.0  --    Recent Labs    01/02/19 1935 07/14/19 0811 07/24/19 09/08/19  AST 24 21 43* 23  ALT 13 7 5* 12  ALKPHOS 56 56 0.4* 46  BILITOT 0.8 0.3  --   --  PROT 6.4* 6.1 5.8  --   ALBUMIN 3.5 4.3 3.7  --    Recent Labs    01/02/19 1311 05/26/19 0906 09/08/19  WBC 10.8* 7.4 7.6  NEUTROABS  --  4,862  --   HGB 9.6* 9.4* 11.1*  HCT 31.4* 29.8* 34*  MCV 92.1 89.5  --   PLT 288 302 245   Lab Results  Component Value Date   TSH 1.62 09/08/2019   No results found for: HGBA1C Lab Results  Component Value Date   CHOL 118 07/14/2019   HDL 50 07/14/2019   LDLCALC 32 07/14/2019   TRIG 182 (H) 07/14/2019   CHOLHDL 2.4 07/14/2019    Significant Diagnostic Results in last 30 days:  No results found.  Assessment/Plan: UTI (urinary tract infection) 10/08/19 urine culture: K. Pneumoniae >100,000c/ml, Cipro 250mg  bid x 7 days.    Hypertension Blood pressure is controlled, continue Metoprolol 25mg  qd.   Parkinson's disease Unsteady  gait, continue Sinemet 25/100mg  I qd, II qhs.     Family/ staff Communication: plan of care reviewed with the patient and charge nurse.   Labs/tests ordered:  None  Time spend 25 minutes.

## 2019-10-12 NOTE — Assessment & Plan Note (Signed)
Unsteady gait, continue Sinemet 25/100mg  I qd, II qhs.

## 2019-10-12 NOTE — Assessment & Plan Note (Signed)
Blood pressure is controlled, continue Metoprolol 25mg qd.  

## 2019-10-12 NOTE — Assessment & Plan Note (Signed)
10/08/19 urine culture: K. Pneumoniae >100,000c/ml, Cipro 250mg  bid x 7 days.

## 2019-10-13 ENCOUNTER — Telehealth: Payer: Self-pay | Admitting: Cardiology

## 2019-10-13 DIAGNOSIS — G20 Parkinson's disease: Secondary | ICD-10-CM | POA: Diagnosis not present

## 2019-10-13 DIAGNOSIS — R278 Other lack of coordination: Secondary | ICD-10-CM | POA: Diagnosis not present

## 2019-10-13 DIAGNOSIS — M6281 Muscle weakness (generalized): Secondary | ICD-10-CM | POA: Diagnosis not present

## 2019-10-13 DIAGNOSIS — R29898 Other symptoms and signs involving the musculoskeletal system: Secondary | ICD-10-CM | POA: Diagnosis not present

## 2019-10-13 DIAGNOSIS — Z20828 Contact with and (suspected) exposure to other viral communicable diseases: Secondary | ICD-10-CM | POA: Diagnosis not present

## 2019-10-13 DIAGNOSIS — Z7389 Other problems related to life management difficulty: Secondary | ICD-10-CM | POA: Diagnosis not present

## 2019-10-13 DIAGNOSIS — R2681 Unsteadiness on feet: Secondary | ICD-10-CM | POA: Diagnosis not present

## 2019-10-13 NOTE — Telephone Encounter (Signed)
New Message    [place] from [facility] is calling and requesting the pts medical records to be sent to them  Fax [phone]    Please call with questions

## 2019-10-14 DIAGNOSIS — R29898 Other symptoms and signs involving the musculoskeletal system: Secondary | ICD-10-CM | POA: Diagnosis not present

## 2019-10-14 DIAGNOSIS — M6281 Muscle weakness (generalized): Secondary | ICD-10-CM | POA: Diagnosis not present

## 2019-10-14 DIAGNOSIS — G20 Parkinson's disease: Secondary | ICD-10-CM | POA: Diagnosis not present

## 2019-10-14 DIAGNOSIS — R278 Other lack of coordination: Secondary | ICD-10-CM | POA: Diagnosis not present

## 2019-10-14 DIAGNOSIS — R2681 Unsteadiness on feet: Secondary | ICD-10-CM | POA: Diagnosis not present

## 2019-10-14 DIAGNOSIS — Z7389 Other problems related to life management difficulty: Secondary | ICD-10-CM | POA: Diagnosis not present

## 2019-10-15 ENCOUNTER — Ambulatory Visit: Payer: Medicare Other | Admitting: Podiatry

## 2019-10-15 DIAGNOSIS — R278 Other lack of coordination: Secondary | ICD-10-CM | POA: Diagnosis not present

## 2019-10-15 DIAGNOSIS — M6281 Muscle weakness (generalized): Secondary | ICD-10-CM | POA: Diagnosis not present

## 2019-10-15 DIAGNOSIS — G20 Parkinson's disease: Secondary | ICD-10-CM | POA: Diagnosis not present

## 2019-10-15 DIAGNOSIS — Z7389 Other problems related to life management difficulty: Secondary | ICD-10-CM | POA: Diagnosis not present

## 2019-10-15 DIAGNOSIS — R2681 Unsteadiness on feet: Secondary | ICD-10-CM | POA: Diagnosis not present

## 2019-10-15 DIAGNOSIS — R29898 Other symptoms and signs involving the musculoskeletal system: Secondary | ICD-10-CM | POA: Diagnosis not present

## 2019-10-16 ENCOUNTER — Non-Acute Institutional Stay (SKILLED_NURSING_FACILITY): Payer: Medicare Other | Admitting: Nurse Practitioner

## 2019-10-16 ENCOUNTER — Encounter: Payer: Self-pay | Admitting: Nurse Practitioner

## 2019-10-16 ENCOUNTER — Other Ambulatory Visit: Payer: Medicare Other

## 2019-10-16 DIAGNOSIS — N390 Urinary tract infection, site not specified: Secondary | ICD-10-CM

## 2019-10-16 DIAGNOSIS — R278 Other lack of coordination: Secondary | ICD-10-CM | POA: Diagnosis not present

## 2019-10-16 DIAGNOSIS — F323 Major depressive disorder, single episode, severe with psychotic features: Secondary | ICD-10-CM | POA: Diagnosis not present

## 2019-10-16 DIAGNOSIS — G20 Parkinson's disease: Secondary | ICD-10-CM | POA: Diagnosis not present

## 2019-10-16 DIAGNOSIS — M6281 Muscle weakness (generalized): Secondary | ICD-10-CM | POA: Diagnosis not present

## 2019-10-16 DIAGNOSIS — R300 Dysuria: Secondary | ICD-10-CM

## 2019-10-16 DIAGNOSIS — Z7389 Other problems related to life management difficulty: Secondary | ICD-10-CM | POA: Diagnosis not present

## 2019-10-16 DIAGNOSIS — R29898 Other symptoms and signs involving the musculoskeletal system: Secondary | ICD-10-CM | POA: Diagnosis not present

## 2019-10-16 DIAGNOSIS — R2681 Unsteadiness on feet: Secondary | ICD-10-CM | POA: Diagnosis not present

## 2019-10-16 NOTE — Assessment & Plan Note (Signed)
Continue Sinemet, w/c for mobility. Close supervision for safety, assistance the patient with transfer.

## 2019-10-16 NOTE — Assessment & Plan Note (Signed)
Improved, but resolving dysuria, urinary frequency/urgency, will complete 7 day course of Cipro 250mg  bid, observe.

## 2019-10-16 NOTE — Assessment & Plan Note (Signed)
UTI is contributory, complete 7 day course of Cipro 250mg  bid, adding Pyridium 100mg  tid x 2 days for symptomatic management.

## 2019-10-16 NOTE — Assessment & Plan Note (Signed)
Stabilizing, continue Sertraline, Quetiapine.

## 2019-10-16 NOTE — Progress Notes (Signed)
Location:   SNF [place] Room Number: 56 Place of Service:  SNF (31) Provider:  Jannette Cotham NP  [name], MD  Patient Care Team: [name], MD as PCP - General (Internal Medicine) [name], MD as PCP - Cardiology (Cardiology) [name], MD (Neurosurgery) [name], MD (Neurology) [name], MD as Consulting Physician (Gastroenterology) [name], MD as Consulting Physician (Urology) Belicia Difatta X, NP as Nurse Practitioner (Internal Medicine)  Extended Emergency Contact Information Primary Emergency Contact: [facility] Address: [address]          [place], [place] [address] [name] of [place] Phone: [phone] Mobile Phone: [phone] Relation: Spouse Secondary Emergency Contact: [name] Mobile Phone: [phone] Relation: Daughter Preferred language: [name] Interpreter needed? No  Code Status:  DNR Goals of care: Advanced Directive information Advanced Directives 09/11/2019  Does Patient Have a Medical Advance Directive? No  Type of Advance Directive -  Does patient want to make changes to medical advance directive? -  Copy of [place] in Chart? -  Would patient like information on creating a medical advance directive? -  Pre-existing out of facility DNR order (yellow form or pink MOST form) -     Chief Complaint  Patient presents with  . Acute Visit    Painful urination SBAR in room    HPI:  Pt is a 85 y.o. female seen today for an acute visit  for c/o painful urination reported from staff 10/15/19, the patient stated its better but not resolving. There are urinary frequency, urgency accompanied painful urination. Currently she is treated for UTI with 7 day course of  Cipro 250mg  bid since 10/11/19 for K. Pneumoniae >100,000c/ml. Hx of psychotic depression, stabilizing, at least sleeps better at night, on Sertraline 50mg  qd, Quetiapine 25mg  qd, 12.5mg  qd and q12h  prn. Hx of Parkinson's, unsteady gait, memory loss, psychosis, taking Sinemet 25/100mg  II qhs, I qd.    Past Medical History:  Diagnosis Date  . Anemia   . Anxiety   . Aortic stenosis    moderate by echo 2019  . Arthritis   . Broken arm    right   . Chronic diastolic CHF (congestive heart failure) ([place])   . Family history of adverse reaction to anesthesia    pt. states sister vomits  . Fibromyalgia   . Fracture of thumb 09/01/2015   right  . Fracture, foot 09/01/2015   right  . GERD (gastroesophageal reflux disease)   . H/O hiatal hernia   . History of bronchitis   . History of kidney stones   . Hypercholesterolemia   . Hypertension    dr t turner  . Hypothyroidism   . Neuropathy   . Osteoporosis   . Parkinson disease ([place])   . PVD (peripheral vascular disease) ([facility])    99% stenosis of left anteiror tibial artery, mod stenosis of left distal SFA and popliteal artery followed by [name]  . RBBB    noted on EKG 2018  . Restless leg   . Shortness of breath    with exertion - chronic   . Sjogren's disease ([place])   . SOB (shortness of breath)    chronic due to diastolic dysfunction, deconditioning, obesity  . Tremor    Past Surgical History:  Procedure Laterality Date  . ABDOMINAL HYSTERECTOMY    . BACK SURGERY     4 back surgeries,   lumbar fusion  . CARDIAC CATHETERIZATION  2006   normal  . CYSTOSCOPY/URETEROSCOPY/HOLMIUM LASER/STENT PLACEMENT Left 01/06/2019   Procedure: CYSTOSCOPY/RETROGRADE/URETEROSCOPY/HOLMIUM LASER/BASKET RETRIEVAL/STENT PLACEMENT;  Surgeon: Ceasar Mons, MD;  Location: [facility];  Service: Urology;  Laterality: Left;  . EYE SURGERY Bilateral    cateracts  . falls     variious fall, broken wrist,and toes  . FRACTURE SURGERY Right April 2016   Wrist, Pt. fell  . kidney stone removal Left 01/20/2019  . RIGHT/LEFT HEART CATH AND CORONARY ANGIOGRAPHY N/A 02/05/2018   Procedure: RIGHT/LEFT HEART CATH AND CORONARY ANGIOGRAPHY;  Surgeon:  Troy Sine, MD;  Location: [facility];  Service: Cardiovascular;  Laterality: N/A;  . SPINAL CORD STIMULATOR INSERTION N/A 09/09/2015   Procedure: LUMBAR SPINAL CORD STIMULATOR INSERTION;  Surgeon: Clydell Hakim, MD;  Location: [facility];  Service: Neurosurgery;  Laterality: N/A;  LUMBAR SPINAL CORD STIMULATOR INSERTION  . SPINE SURGERY  April 2013   Back X's 4  . TOTAL HIP ARTHROPLASTY     right  . WRIST FRACTURE SURGERY Bilateral     Allergies  Allergen Reactions  . Adrenalone   . Lactose Other (See Comments)    abd pain, lactose intolerant  . Morphine Other (See Comments)  . Sulfa Antibiotics Other (See Comments)    Headache, very sick  . Codeine Other (See Comments)    Reaction:  Headaches and nightmares   . Lactose Intolerance (Gi) Nausea And Vomiting  . Latex Rash  . Lyrica [Pregabalin] Swelling and Other (See Comments)    Reaction:  Leg swelling  . Other Other (See Comments)    Pt states that pain medications give her nightmares.    . Plaquenil [Hydroxychloroquine Sulfate] Other (See Comments)    Reaction:  GI upset   . Reglan [Metoclopramide] Other (See Comments)    Reaction:  GI upset   . Requip [Ropinirole Hcl] Other (See Comments)    Reaction:  GI upset   . Septra [Sulfamethoxazole-Trimethoprim] Nausea And Vomiting  . Shellfish Allergy Nausea And Vomiting    Allergies as of 10/16/2019      Reactions   Adrenalone    Lactose Other (See Comments)   abd pain, lactose intolerant   Morphine Other (See Comments)   Sulfa Antibiotics Other (See Comments)   Headache, very sick   Codeine Other (See Comments)   Reaction:  Headaches and nightmares    Lactose Intolerance (gi) Nausea And Vomiting   Latex Rash   Lyrica [pregabalin] Swelling, Other (See Comments)   Reaction:  Leg swelling   Other Other (See Comments)   Pt states that pain medications give her nightmares.     Plaquenil [hydroxychloroquine Sulfate] Other (See Comments)   Reaction:  GI upset     Reglan [metoclopramide] Other (See Comments)   Reaction:  GI upset    Requip [ropinirole Hcl] Other (See Comments)   Reaction:  GI upset    Septra [sulfamethoxazole-trimethoprim] Nausea And Vomiting   Shellfish Allergy Nausea And Vomiting      Medication List       Accurate as of October 16, 2019  4:53 PM. If you have any questions, ask your nurse or doctor.        acetaminophen 500 MG tablet Commonly known as: TYLENOL Take 500 mg by mouth. Once A Day - PRN   AeroChamber MV inhaler by Other route. Use as instructed as needed   albuterol 108 (90 Base) MCG/ACT inhaler Commonly known as: VENTOLIN HFA Inhale 2 puffs into the lungs every 6 (  six) hours as needed for wheezing or shortness of breath.   aspirin EC 81 MG tablet Take 81 mg by mouth at bedtime.   CALCIUM+D3 PO Take by mouth daily. 500-200; amt: 1 tablet; oral once a day   carbidopa-levodopa 25-100 MG tablet Commonly known as: SINEMET IR Take 2 tablets by mouth at bedtime.   carbidopa-levodopa 25-100 MG tablet Commonly known as: SINEMET IR Take 1 tablet by mouth. Once a day   ciprofloxacin 250 MG tablet Commonly known as: CIPRO Take 250 mg by mouth 2 (two) times daily.   denosumab 60 MG/ML Soln injection Commonly known as: PROLIA Inject 60 mg into the skin every 6 (six) months. Administer in upper arm, thigh, or abdomen   docusate sodium 100 MG capsule Commonly known as: COLACE Take 100 mg by mouth at bedtime.   eucerin cream Apply 1 application topically daily.   ezetimibe 10 MG tablet Commonly known as: ZETIA TAKE 1 TABLET ONCE DAILY.   lansoprazole 30 MG disintegrating tablet Commonly known as: PREVACID SOLUTAB Take 30 mg by mouth daily at 12 noon.   levothyroxine 50 MCG tablet Commonly known as: SYNTHROID Take 50 mcg by mouth daily before breakfast.   magnesium hydroxide 400 MG/5ML suspension Commonly known as: MILK OF MAGNESIA MOM 62ml po QD PRN for constipation. If no BM in 24 hours,  check for hard stool in rectum. Remove digitally if present. Fleet's enema per rectum x1dose after removing hard stool from rectum. DO NOT USE FOR CHRONIC KIDNEY DISEASE Stage 4 or HEMODIALYSIS PT. As Needed   metoprolol tartrate 25 MG tablet Commonly known as: LOPRESSOR TAKE 1 TABLET ONCE DAILY.   multivitamin with minerals Tabs tablet Take 1 tablet by mouth daily.   NONFORMULARY OR COMPOUNDED ITEM Peripheral Neuropathy Cream: Bupivacaine 1%, Doxepin 3%, Gabapentin 6%, Pentoxifylline 3%, Topiramate 1% Order faxed to [facility]   nystatin powder Commonly known as: MYCOSTATIN/NYSTOP Apply topically 2 (two) times daily as needed. What changed:   how much to take  reasons to take this   pramipexole 1 MG tablet Commonly known as: MIRAPEX Take 1 mg by mouth 2 (two) times daily.   PreserVision AREDS 2 Chew Chew by mouth. Once a day   QUEtiapine 25 MG tablet Commonly known as: SEROQUEL Take 12.5 mg by mouth. Q12HRS PRN for anxiety, hallucinations   QUEtiapine 12.5 mg Tabs tablet Commonly known as: SEROQUEL Take 12.5 mg by mouth every morning.   QUEtiapine 25 MG tablet Commonly known as: SEROQUEL Take 25 mg by mouth at bedtime.   rosuvastatin 20 MG tablet Commonly known as: CRESTOR Take 1 tablet (20 mg total) by mouth daily.   saccharomyces boulardii 250 MG capsule Commonly known as: FLORASTOR Take 250 mg by mouth 2 (two) times daily.   senna-docusate 8.6-50 MG tablet Commonly known as: Senokot-S Take 1 tablet by mouth daily.   sertraline 25 MG tablet Commonly known as: ZOLOFT Take 50 mg by mouth daily.   Slow Fe 142 (45 Fe) MG Tbcr Generic drug: Ferrous Sulfate Take 1 tablet by mouth. Daily   Systane 0.4-0.3 % Soln Generic drug: Polyethyl Glycol-Propyl Glycol Apply 1 drop to eye 2 (two) times daily as needed.   Vitamin D 50 MCG (2000 UT) Caps Take 2,000 Units by mouth daily.      ROS was provided with assistance of staff.  Review of Systems   Constitutional: Negative for activity change, appetite change, chills, diaphoresis, fatigue and fever.  HENT: Positive for hearing loss. Negative for  congestion and voice change.   Respiratory: Negative for cough and shortness of breath.   Cardiovascular: Positive for leg swelling. Negative for chest pain and palpitations.  Gastrointestinal: Negative for abdominal distention, abdominal pain, constipation, diarrhea and nausea.  Genitourinary: Positive for dysuria, frequency and urgency. Negative for difficulty urinating.  Musculoskeletal: Positive for arthralgias, back pain and gait problem.  Skin: Negative for color change and pallor.  Neurological: Negative for dizziness, speech difficulty and weakness.       Memory lapses.   Psychiatric/Behavioral: Positive for behavioral problems, confusion and hallucinations. Negative for agitation and sleep disturbance. The patient is not nervous/anxious.     Immunization History  Administered Date(s) Administered  . Influenza Split 09/09/2013  . Influenza, High Dose Seasonal PF 09/12/2018  . Influenza,inj,Quad PF,6+ Mos 09/11/2016  . Pneumococcal Conjugate-13 04/15/2014  . Pneumococcal Polysaccharide-23 09/28/2005  . Tdap 09/01/2015  . Zoster Recombinat (Shingrix) 06/11/2018, 09/10/2018   Pertinent  Health Maintenance Due  Topic Date Due  . INFLUENZA VACCINE  07/11/2019  . DEXA SCAN  Completed  . PNA vac Low Risk Adult  Completed   Fall Risk  04/23/2019 04/09/2019 10/29/2018 10/27/2018 10/09/2018  Falls in the past year? 0 0 0 1 Yes  Comment - - fell June 2019 - -  Number falls in past yr: 0 0 - 0 1  Injury with Fall? 0 0 - 1 Yes  Comment - - - - -  Risk for fall due to : - - - - -  Risk for fall due to: Comment - - - - -   Functional Status Survey:    Vitals:   10/16/19 1042  BP: 136/74  Pulse: 84  Resp: 20  Temp: (!) 97 F (36.1 C)  SpO2: 93%  Weight: 142 lb 8 oz (64.6 kg)  Height: 5\' 2"  (1.575 m)   Body mass index is 26.06  kg/m. Physical Exam Vitals signs and nursing note reviewed.  Constitutional:      General: She is not in acute distress.    Appearance: Normal appearance. She is not ill-appearing, toxic-appearing or diaphoretic.  HENT:     Head: Normocephalic and atraumatic.     Nose: Nose normal.     Mouth/Throat:     Mouth: Mucous membranes are moist.  Eyes:     Extraocular Movements: Extraocular movements intact.     Conjunctiva/sclera: Conjunctivae normal.     Pupils: Pupils are equal, round, and reactive to light.  Neck:     Musculoskeletal: Normal range of motion and neck supple.  Cardiovascular:     Rate and Rhythm: Normal rate and regular rhythm.     Heart sounds: Murmur present.  Pulmonary:     Breath sounds: Rales present. No wheezing or rhonchi.     Comments: Bibasilar rales.  Abdominal:     General: Bowel sounds are normal. There is no distension.     Palpations: Abdomen is soft.     Tenderness: There is no abdominal tenderness. There is no right CVA tenderness, left CVA tenderness, guarding or rebound.  Musculoskeletal:     Right lower leg: Edema present.     Left lower leg: Edema present.     Comments: Trace edema BLE. Needs assistance for transfer or walking with walker, otherwise self propels w/c to get around.   Skin:    General: Skin is warm and dry.  Neurological:     General: No focal deficit present.     Mental Status: She is alert. Mental  status is at baseline.     Motor: No weakness.     Coordination: Coordination abnormal.     Gait: Gait abnormal.     Comments: Oriented to person, place.   Psychiatric:     Comments: Some confusion, delusion     Labs reviewed: Recent Labs    01/02/19 1311 05/26/19 0906 07/24/19 09/08/19  NA 138 143 142 144  K 4.7 4.1 4.3 3.5  CL 108 110 110  --   CO2 21* 24 24  --   GLUCOSE 118* 97  --   --   BUN 14 13 12 10   CREATININE 1.34* 0.65 0.7 0.7  CALCIUM 9.5 9.0 9.0  --    Recent Labs    01/02/19 1935 07/14/19 0811  07/24/19 09/08/19  AST 24 21 43* 23  ALT 13 7 5* 12  ALKPHOS 56 56 0.4* 46  BILITOT 0.8 0.3  --   --   PROT 6.4* 6.1 5.8  --   ALBUMIN 3.5 4.3 3.7  --    Recent Labs    01/02/19 1311 05/26/19 0906 09/08/19  WBC 10.8* 7.4 7.6  NEUTROABS  --  4,862  --   HGB 9.6* 9.4* 11.1*  HCT 31.4* 29.8* 34*  MCV 92.1 89.5  --   PLT 288 302 245   Lab Results  Component Value Date   TSH 1.62 09/08/2019   No results found for: HGBA1C Lab Results  Component Value Date   CHOL 118 07/14/2019   HDL 50 07/14/2019   LDLCALC 32 07/14/2019   TRIG 182 (H) 07/14/2019   CHOLHDL 2.4 07/14/2019    Significant Diagnostic Results in last 30 days:  No results found.  Assessment/Plan Dysuria UTI is contributory, complete 7 day course of Cipro 250mg  bid, adding Pyridium 100mg  tid x 2 days for symptomatic management.   Acute lower UTI Improved, but resolving dysuria, urinary frequency/urgency, will complete 7 day course of Cipro 250mg  bid, observe.   Depression, psychotic ([place]) Stabilizing, continue Sertraline, Quetiapine.   Parkinson's disease Continue Sinemet, w/c for mobility. Close supervision for safety, assistance the patient with transfer.      Family/ staff Communication: plan of care reviewed with the patient and charge nurse.   Labs/tests ordered:  none  Time spend 25 minutes.

## 2019-10-19 DIAGNOSIS — R29898 Other symptoms and signs involving the musculoskeletal system: Secondary | ICD-10-CM | POA: Diagnosis not present

## 2019-10-19 DIAGNOSIS — R278 Other lack of coordination: Secondary | ICD-10-CM | POA: Diagnosis not present

## 2019-10-19 DIAGNOSIS — G20 Parkinson's disease: Secondary | ICD-10-CM | POA: Diagnosis not present

## 2019-10-19 DIAGNOSIS — Z7389 Other problems related to life management difficulty: Secondary | ICD-10-CM | POA: Diagnosis not present

## 2019-10-19 DIAGNOSIS — M6281 Muscle weakness (generalized): Secondary | ICD-10-CM | POA: Diagnosis not present

## 2019-10-19 DIAGNOSIS — R2681 Unsteadiness on feet: Secondary | ICD-10-CM | POA: Diagnosis not present

## 2019-10-20 ENCOUNTER — Non-Acute Institutional Stay (SKILLED_NURSING_FACILITY): Payer: Medicare Other | Admitting: Internal Medicine

## 2019-10-20 ENCOUNTER — Encounter: Payer: Self-pay | Admitting: Internal Medicine

## 2019-10-20 DIAGNOSIS — Z03818 Encounter for observation for suspected exposure to other biological agents ruled out: Secondary | ICD-10-CM | POA: Diagnosis not present

## 2019-10-20 DIAGNOSIS — R278 Other lack of coordination: Secondary | ICD-10-CM | POA: Diagnosis not present

## 2019-10-20 DIAGNOSIS — Z7389 Other problems related to life management difficulty: Secondary | ICD-10-CM | POA: Diagnosis not present

## 2019-10-20 DIAGNOSIS — R2681 Unsteadiness on feet: Secondary | ICD-10-CM | POA: Diagnosis not present

## 2019-10-20 DIAGNOSIS — M6281 Muscle weakness (generalized): Secondary | ICD-10-CM | POA: Diagnosis not present

## 2019-10-20 DIAGNOSIS — R29898 Other symptoms and signs involving the musculoskeletal system: Secondary | ICD-10-CM | POA: Diagnosis not present

## 2019-10-20 DIAGNOSIS — G20 Parkinson's disease: Secondary | ICD-10-CM

## 2019-10-20 NOTE — Progress Notes (Signed)
Location:    [facility] Room Number: 58 Place of Service:  SNF (31) Provider:  [name], MD  [name], MD  Patient Care Team: [name], MD as PCP - General (Internal Medicine) [name], MD as PCP - Cardiology (Cardiology) [name], MD (Neurosurgery) [name], MD (Neurology) [name], MD as Consulting Physician (Gastroenterology) [name], MD as Consulting Physician (Urology) [name], Man X, NP as Nurse Practitioner (Internal Medicine)  Extended Emergency Contact Information Primary Emergency Contact: [facility] Address: [address]          [place],  [address] [name] of [place] Phone: [phone] Mobile Phone: [phone] Relation: Spouse Secondary Emergency Contact: [name] Mobile Phone: [phone] Relation: Daughter Preferred language: [name] Interpreter needed? No  Code Status:  Full Code Goals of care: Advanced Directive information Advanced Directives 09/11/2019  Does Patient Have a Medical Advance Directive? No  Type of Advance Directive -  Does patient want to make changes to medical advance directive? -  Copy of [place] in Chart? -  Would patient like information on creating a medical advance directive? -  Pre-existing out of facility DNR order (yellow form or pink MOST form) -     Chief Complaint  Patient presents with  . Acute Visit    Wheelchair    HPI:  Pt is a 85 y.o. female seen today for an acute visit for Face to face visit for Wheelchair  Patient has history of Parkinson disease with visual hallucinations, peripheral neuropathy, hypothyroid, GERD, osteoporosis, hyperlipidemia, gout, dyspnea on exertion, LE edema and Moderate Aortic Stenosis Patient is recently [place] the SNF [place] live with her husband.  She is having confusion hallucinations and increased help with her ADLs And her husband was not  able to take care of her Patient was seen today for evaluation of need for wheelchair I have discussed with the therapy.  Patient continues to stay fall risk.  She is unable to walk long distances without assist.  Needs help with her transfers. Continues to have progression of her Parkinson disease  Past Medical History:  Diagnosis Date  . Anemia   . Anxiety   . Aortic stenosis    moderate by echo 2019  . Arthritis   . Broken arm    right   . Chronic diastolic CHF (congestive heart failure) ([place])   . Family history of adverse reaction to anesthesia    pt. states sister vomits  . Fibromyalgia   . Fracture of thumb 09/01/2015   right  . Fracture, foot 09/01/2015   right  . GERD (gastroesophageal reflux disease)   . H/O hiatal hernia   . History of bronchitis   . History of kidney stones   . Hypercholesterolemia   . Hypertension    dr t turner  . Hypothyroidism   . Neuropathy   . Osteoporosis   . Parkinson disease ([place])   . PVD (peripheral vascular disease) ([facility])    99% stenosis of left anteiror tibial artery, mod stenosis of left distal SFA and popliteal artery followed by [name]  . RBBB    noted on EKG 2018  . Restless leg   . Shortness of breath    with exertion - chronic   . Sjogren's disease ([place])   . SOB (shortness of breath)    chronic due to diastolic dysfunction, deconditioning, obesity  . Tremor    Past  Surgical History:  Procedure Laterality Date  . ABDOMINAL HYSTERECTOMY    . BACK SURGERY     4 back surgeries,   lumbar fusion  . CARDIAC CATHETERIZATION  2006   normal  . CYSTOSCOPY/URETEROSCOPY/HOLMIUM LASER/STENT PLACEMENT Left 01/06/2019   Procedure: CYSTOSCOPY/RETROGRADE/URETEROSCOPY/HOLMIUM LASER/BASKET RETRIEVAL/STENT PLACEMENT;  Surgeon: Ceasar Mons, MD;  Location: [facility];  Service: Urology;  Laterality: Left;  . EYE SURGERY Bilateral    cateracts  . falls     variious fall, broken wrist,and toes  . FRACTURE SURGERY Right April  2016   Wrist, Pt. fell  . kidney stone removal Left 01/20/2019  . RIGHT/LEFT HEART CATH AND CORONARY ANGIOGRAPHY N/A 02/05/2018   Procedure: RIGHT/LEFT HEART CATH AND CORONARY ANGIOGRAPHY;  Surgeon: Troy Sine, MD;  Location: [facility];  Service: Cardiovascular;  Laterality: N/A;  . SPINAL CORD STIMULATOR INSERTION N/A 09/09/2015   Procedure: LUMBAR SPINAL CORD STIMULATOR INSERTION;  Surgeon: Clydell Hakim, MD;  Location: [facility];  Service: Neurosurgery;  Laterality: N/A;  LUMBAR SPINAL CORD STIMULATOR INSERTION  . SPINE SURGERY  April 2013   Back X's 4  . TOTAL HIP ARTHROPLASTY     right  . WRIST FRACTURE SURGERY Bilateral     Allergies  Allergen Reactions  . Adrenalone   . Lactose Other (See Comments)    abd pain, lactose intolerant  . Morphine Other (See Comments)  . Sulfa Antibiotics Other (See Comments)    Headache, very sick  . Codeine Other (See Comments)    Reaction:  Headaches and nightmares   . Lactose Intolerance (Gi) Nausea And Vomiting  . Latex Rash  . Lyrica [Pregabalin] Swelling and Other (See Comments)    Reaction:  Leg swelling  . Other Other (See Comments)    Pt states that pain medications give her nightmares.    . Plaquenil [Hydroxychloroquine Sulfate] Other (See Comments)    Reaction:  GI upset   . Reglan [Metoclopramide] Other (See Comments)    Reaction:  GI upset   . Requip [Ropinirole Hcl] Other (See Comments)    Reaction:  GI upset   . Septra [Sulfamethoxazole-Trimethoprim] Nausea And Vomiting  . Shellfish Allergy Nausea And Vomiting    Allergies as of 10/20/2019      Reactions   Adrenalone    Lactose Other (See Comments)   abd pain, lactose intolerant   Morphine Other (See Comments)   Sulfa Antibiotics Other (See Comments)   Headache, very sick   Codeine Other (See Comments)   Reaction:  Headaches and nightmares    Lactose Intolerance (gi) Nausea And Vomiting   Latex Rash   Lyrica [pregabalin] Swelling, Other (See Comments)    Reaction:  Leg swelling   Other Other (See Comments)   Pt states that pain medications give her nightmares.     Plaquenil [hydroxychloroquine Sulfate] Other (See Comments)   Reaction:  GI upset    Reglan [metoclopramide] Other (See Comments)   Reaction:  GI upset    Requip [ropinirole Hcl] Other (See Comments)   Reaction:  GI upset    Septra [sulfamethoxazole-trimethoprim] Nausea And Vomiting   Shellfish Allergy Nausea And Vomiting      Medication List       Accurate as of October 20, 2019 11:42 AM. If you have any questions, ask your nurse or doctor.        STOP taking these medications   ciprofloxacin 250 MG tablet Commonly known as: CIPRO Stopped by: [name], MD  magnesium hydroxide 400 MG/5ML suspension Commonly known as: MILK OF MAGNESIA Stopped by: [name], MD     TAKE these medications   acetaminophen 500 MG tablet Commonly known as: TYLENOL Take 500 mg by mouth. Once A Day - PRN   AeroChamber MV inhaler by Other route. Use as instructed as needed   albuterol 108 (90 Base) MCG/ACT inhaler Commonly known as: VENTOLIN HFA Inhale 2 puffs into the lungs every 6 (six) hours as needed for wheezing or shortness of breath.   aspirin EC 81 MG tablet Take 81 mg by mouth at bedtime.   CALCIUM+D3 PO Take by mouth daily. 500-200; amt: 1 tablet; oral once a day   carbidopa-levodopa 25-100 MG tablet Commonly known as: SINEMET IR Take 2 tablets by mouth at bedtime.   carbidopa-levodopa 25-100 MG tablet Commonly known as: SINEMET IR Take 1 tablet by mouth. Once a day   denosumab 60 MG/ML Soln injection Commonly known as: PROLIA Inject 60 mg into the skin every 6 (six) months. Administer in upper arm, thigh, or abdomen   docusate sodium 100 MG capsule Commonly known as: COLACE Take 100 mg by mouth at bedtime.   eucerin cream Apply 1 application topically daily.   ezetimibe 10 MG tablet Commonly known as: ZETIA TAKE 1 TABLET ONCE DAILY.    lansoprazole 30 MG disintegrating tablet Commonly known as: PREVACID SOLUTAB Take 30 mg by mouth daily at 12 noon.   levothyroxine 50 MCG tablet Commonly known as: SYNTHROID Take 50 mcg by mouth daily before breakfast.   metoprolol tartrate 25 MG tablet Commonly known as: LOPRESSOR TAKE 1 TABLET ONCE DAILY.   multivitamin with minerals Tabs tablet Take 1 tablet by mouth daily.   NONFORMULARY OR COMPOUNDED ITEM Peripheral Neuropathy Cream: Bupivacaine 1%, Doxepin 3%, Gabapentin 6%, Pentoxifylline 3%, Topiramate 1% Order faxed to [facility]   nystatin powder Commonly known as: MYCOSTATIN/NYSTOP Apply topically 2 (two) times daily as needed. What changed:   how much to take  reasons to take this   pramipexole 1 MG tablet Commonly known as: MIRAPEX Take 1 mg by mouth 2 (two) times daily.   PreserVision AREDS 2 Chew Chew by mouth. Once a day   QUEtiapine 25 MG tablet Commonly known as: SEROQUEL Take 12.5 mg by mouth. Q12HRS PRN for anxiety, hallucinations   QUEtiapine 12.5 mg Tabs tablet Commonly known as: SEROQUEL Take 12.5 mg by mouth every morning.   QUEtiapine 25 MG tablet Commonly known as: SEROQUEL Take 25 mg by mouth at bedtime.   rosuvastatin 20 MG tablet Commonly known as: CRESTOR Take 1 tablet (20 mg total) by mouth daily.   saccharomyces boulardii 250 MG capsule Commonly known as: FLORASTOR Take 250 mg by mouth 2 (two) times daily.   senna-docusate 8.6-50 MG tablet Commonly known as: Senokot-S Take 1 tablet by mouth daily.   sertraline 25 MG tablet Commonly known as: ZOLOFT Take 50 mg by mouth daily.   Slow Fe 142 (45 Fe) MG Tbcr Generic drug: Ferrous Sulfate Take 1 tablet by mouth. Daily   Systane 0.4-0.3 % Soln Generic drug: Polyethyl Glycol-Propyl Glycol Apply 1 drop to eye 2 (two) times daily as needed.   Vitamin D 50 MCG (2000 UT) Caps Take 2,000 Units by mouth daily.       Review of Systems  Constitutional: Positive  for activity change.  HENT: Negative.   Respiratory: Negative.   Cardiovascular: Positive for leg swelling.  Gastrointestinal: Positive for constipation.  Genitourinary: Positive for dysuria,  pelvic pain and urgency.  Musculoskeletal: Positive for gait problem.  Neurological: Positive for weakness.  Psychiatric/Behavioral: Positive for confusion, dysphoric mood and hallucinations.    Immunization History  Administered Date(s) Administered  . Influenza Split 09/09/2013  . Influenza, High Dose Seasonal PF 09/12/2018  . Influenza,inj,Quad PF,6+ Mos 09/11/2016  . Pneumococcal Conjugate-13 04/15/2014  . Pneumococcal Polysaccharide-23 09/28/2005  . Tdap 09/01/2015  . Zoster Recombinat (Shingrix) 06/11/2018, 09/10/2018   Pertinent  Health Maintenance Due  Topic Date Due  . INFLUENZA VACCINE  07/11/2019  . DEXA SCAN  Completed  . PNA vac Low Risk Adult  Completed   Fall Risk  04/23/2019 04/09/2019 10/29/2018 10/27/2018 10/09/2018  Falls in the past year? 0 0 0 1 Yes  Comment - - fell June 2019 - -  Number falls in past yr: 0 0 - 0 1  Injury with Fall? 0 0 - 1 Yes  Comment - - - - -  Risk for fall due to : - - - - -  Risk for fall due to: Comment - - - - -   Functional Status Survey:    Vitals:   10/20/19 1130  BP: 136/74  Pulse: 84  Resp: 20  Temp: (!) 97.2 F (36.2 C)  SpO2: 95%  Weight: 142 lb 8 oz (64.6 kg)  Height: 5\' 2"  (1.575 m)   Body mass index is 26.06 kg/m. Physical Exam Vitals signs reviewed.  Constitutional:      Appearance: Normal appearance.  HENT:     Head: Normocephalic.     Nose: Nose normal.     Mouth/Throat:     Mouth: Mucous membranes are moist.     Pharynx: Oropharynx is clear.  Eyes:     Pupils: Pupils are equal, round, and reactive to light.  Neck:     Musculoskeletal: Neck supple.  Cardiovascular:     Rate and Rhythm: Normal rate.     Pulses: Normal pulses.     Heart sounds: Murmur present.  Pulmonary:     Effort: Pulmonary effort  is normal.     Breath sounds: Normal breath sounds.  Abdominal:     General: Abdomen is flat. Bowel sounds are normal.     Palpations: Abdomen is soft.  Musculoskeletal:        General: Swelling present.  Skin:    General: Skin is warm.  Neurological:     Mental Status: She is alert.     Comments: Patient unable to get out of the chair without support Was walking with the Mild assist with therapy and walker   Psychiatric:        Mood and Affect: Mood normal.        Thought Content: Thought content normal.     Labs reviewed: Recent Labs    01/02/19 1311 05/26/19 0906 07/24/19 09/08/19  NA 138 143 142 144  K 4.7 4.1 4.3 3.5  CL 108 110 110  --   CO2 21* 24 24  --   GLUCOSE 118* 97  --   --   BUN 14 13 12 10   CREATININE 1.34* 0.65 0.7 0.7  CALCIUM 9.5 9.0 9.0  --    Recent Labs    01/02/19 1935 07/14/19 0811 07/24/19 09/08/19  AST 24 21 43* 23  ALT 13 7 5* 12  ALKPHOS 56 56 0.4* 46  BILITOT 0.8 0.3  --   --   PROT 6.4* 6.1 5.8  --   ALBUMIN 3.5 4.3 3.7  --  Recent Labs    01/02/19 1311 05/26/19 0906 09/08/19  WBC 10.8* 7.4 7.6  NEUTROABS  --  4,862  --   HGB 9.6* 9.4* 11.1*  HCT 31.4* 29.8* 34*  MCV 92.1 89.5  --   PLT 288 302 245   Lab Results  Component Value Date   TSH 1.62 09/08/2019   No results found for: HGBA1C Lab Results  Component Value Date   CHOL 118 07/14/2019   HDL 50 07/14/2019   LDLCALC 32 07/14/2019   TRIG 182 (H) 07/14/2019   CHOLHDL 2.4 07/14/2019    Significant Diagnostic Results in last 30 days:  No results found.  Assessment/Plan Patient with H/o Progressive Parkinson Disease and Gait Instability Was evaluated for need for Wheelchair  D/W therapy and Nurse She continue to need assit with her Walking and Transfers Stays Fall risk Prescription written for Wheelchair   Family/ staff Communication:   Labs/tests ordered:    Total time spent in this patient care encounter was  25_  minutes; greater than 50% of the  visit spent counseling patient and staff, reviewing records , Labs and coordinating care for problems addressed at this encounter.

## 2019-10-21 DIAGNOSIS — R278 Other lack of coordination: Secondary | ICD-10-CM | POA: Diagnosis not present

## 2019-10-21 DIAGNOSIS — G20 Parkinson's disease: Secondary | ICD-10-CM | POA: Diagnosis not present

## 2019-10-21 DIAGNOSIS — Z7389 Other problems related to life management difficulty: Secondary | ICD-10-CM | POA: Diagnosis not present

## 2019-10-21 DIAGNOSIS — R2681 Unsteadiness on feet: Secondary | ICD-10-CM | POA: Diagnosis not present

## 2019-10-21 DIAGNOSIS — M6281 Muscle weakness (generalized): Secondary | ICD-10-CM | POA: Diagnosis not present

## 2019-10-21 DIAGNOSIS — R29898 Other symptoms and signs involving the musculoskeletal system: Secondary | ICD-10-CM | POA: Diagnosis not present

## 2019-10-22 ENCOUNTER — Encounter: Payer: Self-pay | Admitting: Internal Medicine

## 2019-10-22 ENCOUNTER — Telehealth: Payer: Self-pay

## 2019-10-22 DIAGNOSIS — Z7389 Other problems related to life management difficulty: Secondary | ICD-10-CM | POA: Diagnosis not present

## 2019-10-22 DIAGNOSIS — M6281 Muscle weakness (generalized): Secondary | ICD-10-CM | POA: Diagnosis not present

## 2019-10-22 DIAGNOSIS — R2681 Unsteadiness on feet: Secondary | ICD-10-CM | POA: Diagnosis not present

## 2019-10-22 DIAGNOSIS — G20 Parkinson's disease: Secondary | ICD-10-CM | POA: Diagnosis not present

## 2019-10-22 DIAGNOSIS — R29898 Other symptoms and signs involving the musculoskeletal system: Secondary | ICD-10-CM | POA: Diagnosis not present

## 2019-10-22 DIAGNOSIS — R278 Other lack of coordination: Secondary | ICD-10-CM | POA: Diagnosis not present

## 2019-10-22 NOTE — Telephone Encounter (Signed)
Ray Randol Kern- Occupational therapist with [facility]  requesting office note from wheelchair visit. Faxed to [phone] Call [phone]

## 2019-10-22 NOTE — Progress Notes (Signed)
A user error has taken place.

## 2019-10-26 DIAGNOSIS — R29898 Other symptoms and signs involving the musculoskeletal system: Secondary | ICD-10-CM | POA: Diagnosis not present

## 2019-10-26 DIAGNOSIS — Z7389 Other problems related to life management difficulty: Secondary | ICD-10-CM | POA: Diagnosis not present

## 2019-10-26 DIAGNOSIS — R278 Other lack of coordination: Secondary | ICD-10-CM | POA: Diagnosis not present

## 2019-10-26 DIAGNOSIS — G20 Parkinson's disease: Secondary | ICD-10-CM | POA: Diagnosis not present

## 2019-10-26 DIAGNOSIS — R2681 Unsteadiness on feet: Secondary | ICD-10-CM | POA: Diagnosis not present

## 2019-10-26 DIAGNOSIS — M6281 Muscle weakness (generalized): Secondary | ICD-10-CM | POA: Diagnosis not present

## 2019-10-27 ENCOUNTER — Non-Acute Institutional Stay (SKILLED_NURSING_FACILITY): Payer: Medicare Other | Admitting: Internal Medicine

## 2019-10-27 ENCOUNTER — Encounter: Payer: Self-pay | Admitting: Internal Medicine

## 2019-10-27 DIAGNOSIS — Z7389 Other problems related to life management difficulty: Secondary | ICD-10-CM | POA: Diagnosis not present

## 2019-10-27 DIAGNOSIS — M3500 Sicca syndrome, unspecified: Secondary | ICD-10-CM

## 2019-10-27 DIAGNOSIS — R441 Visual hallucinations: Secondary | ICD-10-CM

## 2019-10-27 DIAGNOSIS — D500 Iron deficiency anemia secondary to blood loss (chronic): Secondary | ICD-10-CM | POA: Diagnosis not present

## 2019-10-27 DIAGNOSIS — F329 Major depressive disorder, single episode, unspecified: Secondary | ICD-10-CM | POA: Diagnosis not present

## 2019-10-27 DIAGNOSIS — F32A Depression, unspecified: Secondary | ICD-10-CM

## 2019-10-27 DIAGNOSIS — M6281 Muscle weakness (generalized): Secondary | ICD-10-CM | POA: Diagnosis not present

## 2019-10-27 DIAGNOSIS — E039 Hypothyroidism, unspecified: Secondary | ICD-10-CM

## 2019-10-27 DIAGNOSIS — G20 Parkinson's disease: Secondary | ICD-10-CM | POA: Diagnosis not present

## 2019-10-27 DIAGNOSIS — Z20828 Contact with and (suspected) exposure to other viral communicable diseases: Secondary | ICD-10-CM | POA: Diagnosis not present

## 2019-10-27 DIAGNOSIS — K5909 Other constipation: Secondary | ICD-10-CM

## 2019-10-27 DIAGNOSIS — R278 Other lack of coordination: Secondary | ICD-10-CM | POA: Diagnosis not present

## 2019-10-27 DIAGNOSIS — I10 Essential (primary) hypertension: Secondary | ICD-10-CM

## 2019-10-27 DIAGNOSIS — R29898 Other symptoms and signs involving the musculoskeletal system: Secondary | ICD-10-CM | POA: Diagnosis not present

## 2019-10-27 DIAGNOSIS — R2681 Unsteadiness on feet: Secondary | ICD-10-CM

## 2019-10-27 NOTE — Progress Notes (Signed)
Location:    [facility]ursing Home Room Number: 53 Place of Service:  SNF (31) Provider: [name], MD  [name], MD  Patient Care Team: [name], MD as PCP - General (Internal Medicine) [name], MD as PCP - Cardiology (Cardiology) [name], MD (Neurosurgery) [name], MD (Neurology) [name], MD as Consulting Physician (Gastroenterology) [name], MD as Consulting Physician (Urology) [name], Man X, NP as Nurse Practitioner (Internal Medicine)  Extended Emergency Contact Information Primary Emergency Contact: [facility] Address: [place]          [place],  [address] [name] of [place] Phone: [phone] Mobile Phone: [phone] Relation: Spouse Secondary Emergency Contact: [name] Mobile Phone: [phone] Relation: Daughter Preferred language: [name] Interpreter needed? No  Code Status:  Full Code Goals of care: Advanced Directive information Advanced Directives 09/11/2019  Does Patient Have a Medical Advance Directive? No  Type of Advance Directive -  Does patient want to make changes to medical advance directive? -  Copy of [place] in Chart? -  Would patient like information on creating a medical advance directive? -  Pre-existing out of facility DNR order (yellow form or pink MOST form) -     Chief Complaint  Patient presents with  . Acute Visit    Follow up on confusion    HPI:  Pt is a 85 y.o. female seen today for an acute visit for Confusion , Hallucination, UTI and Constipation  Patient has history of Parkinson disease with visual hallucinations, peripheral neuropathy, hypothyroid, GERD, osteoporosis, hyperlipidemia, gout, dyspnea on exertion and Aortic stenosis Patient [place] [facility] Per her husband patient is getting dependent for her ADLs. She was unable to get out of bed and take care of herself without  his help. So she has been moved to SNF now  Patient had Behavior issues including Hallucinations and trying to leave the facility She was started on Seroquel . Since then she is doing better but as evening comes she starts getting confused and tries to leave the facility and yells for her husband  Constipation much improved on Amitiza Mood seems better Appetite is improved No SOB or Chest Pain.     Past Medical History:  Diagnosis Date  . Anemia   . Anxiety   . Aortic stenosis    moderate by echo 2019  . Arthritis   . Broken arm    right   . Chronic diastolic CHF (congestive heart failure) ([place])   . Family history of adverse reaction to anesthesia    pt. states sister vomits  . Fibromyalgia   . Fracture of thumb 09/01/2015   right  . Fracture, foot 09/01/2015   right  . GERD (gastroesophageal reflux disease)   . H/O hiatal hernia   . History of bronchitis   . History of kidney stones   . Hypercholesterolemia   . Hypertension    dr t turner  . Hypothyroidism   . Neuropathy   . Osteoporosis   . Parkinson disease ([place])   . PVD (peripheral vascular disease) ([facility])    99% stenosis of left anteiror tibial artery, mod stenosis of left distal SFA and popliteal artery followed by [name]  . RBBB    noted on EKG 2018  . Restless leg   . Shortness of breath    with exertion - chronic   . Sjogren's disease ([place])   . SOB (  shortness of breath)    chronic due to diastolic dysfunction, deconditioning, obesity  . Tremor    Past Surgical History:  Procedure Laterality Date  . ABDOMINAL HYSTERECTOMY    . BACK SURGERY     4 back surgeries,   lumbar fusion  . CARDIAC CATHETERIZATION  2006   normal  . CYSTOSCOPY/URETEROSCOPY/HOLMIUM LASER/STENT PLACEMENT Left 01/06/2019   Procedure: CYSTOSCOPY/RETROGRADE/URETEROSCOPY/HOLMIUM LASER/BASKET RETRIEVAL/STENT PLACEMENT;  Surgeon: Ceasar Mons, MD;  Location: [facility];  Service: Urology;  Laterality: Left;  . EYE SURGERY  Bilateral    cateracts  . falls     variious fall, broken wrist,and toes  . FRACTURE SURGERY Right April 2016   Wrist, Pt. fell  . kidney stone removal Left 01/20/2019  . RIGHT/LEFT HEART CATH AND CORONARY ANGIOGRAPHY N/A 02/05/2018   Procedure: RIGHT/LEFT HEART CATH AND CORONARY ANGIOGRAPHY;  Surgeon: Troy Sine, MD;  Location: [facility];  Service: Cardiovascular;  Laterality: N/A;  . SPINAL CORD STIMULATOR INSERTION N/A 09/09/2015   Procedure: LUMBAR SPINAL CORD STIMULATOR INSERTION;  Surgeon: Clydell Hakim, MD;  Location: [facility];  Service: Neurosurgery;  Laterality: N/A;  LUMBAR SPINAL CORD STIMULATOR INSERTION  . SPINE SURGERY  April 2013   Back X's 4  . TOTAL HIP ARTHROPLASTY     right  . WRIST FRACTURE SURGERY Bilateral     Allergies  Allergen Reactions  . Adrenalone   . Lactose Other (See Comments)    abd pain, lactose intolerant  . Morphine Other (See Comments)  . Sulfa Antibiotics Other (See Comments)    Headache, very sick  . Codeine Other (See Comments)    Reaction:  Headaches and nightmares   . Lactose Intolerance (Gi) Nausea And Vomiting  . Latex Rash  . Lyrica [Pregabalin] Swelling and Other (See Comments)    Reaction:  Leg swelling  . Other Other (See Comments)    Pt states that pain medications give her nightmares.    . Plaquenil [Hydroxychloroquine Sulfate] Other (See Comments)    Reaction:  GI upset   . Reglan [Metoclopramide] Other (See Comments)    Reaction:  GI upset   . Requip [Ropinirole Hcl] Other (See Comments)    Reaction:  GI upset   . Septra [Sulfamethoxazole-Trimethoprim] Nausea And Vomiting  . Shellfish Allergy Nausea And Vomiting    Allergies as of 10/27/2019      Reactions   Adrenalone    Lactose Other (See Comments)   abd pain, lactose intolerant   Morphine Other (See Comments)   Sulfa Antibiotics Other (See Comments)   Headache, very sick   Codeine Other (See Comments)   Reaction:  Headaches and nightmares     Lactose Intolerance (gi) Nausea And Vomiting   Latex Rash   Lyrica [pregabalin] Swelling, Other (See Comments)   Reaction:  Leg swelling   Other Other (See Comments)   Pt states that pain medications give her nightmares.     Plaquenil [hydroxychloroquine Sulfate] Other (See Comments)   Reaction:  GI upset    Reglan [metoclopramide] Other (See Comments)   Reaction:  GI upset    Requip [ropinirole Hcl] Other (See Comments)   Reaction:  GI upset    Septra [sulfamethoxazole-trimethoprim] Nausea And Vomiting   Shellfish Allergy Nausea And Vomiting      Medication List       Accurate as of October 27, 2019 11:35 AM. If you have any questions, ask your nurse or doctor.        acetaminophen  500 MG tablet Commonly known as: TYLENOL Take 500 mg by mouth. Once A Day - PRN   AeroChamber MV inhaler by Other route. Use as instructed as needed   albuterol 108 (90 Base) MCG/ACT inhaler Commonly known as: VENTOLIN HFA Inhale 2 puffs into the lungs every 6 (six) hours as needed for wheezing or shortness of breath.   aspirin EC 81 MG tablet Take 81 mg by mouth at bedtime.   CALCIUM+D3 PO Take by mouth daily. 500-200; amt: 1 tablet; oral once a day   carbidopa-levodopa 25-100 MG tablet Commonly known as: SINEMET IR Take 2 tablets by mouth at bedtime.   carbidopa-levodopa 25-100 MG tablet Commonly known as: SINEMET IR Take 1 tablet by mouth. Once a day   denosumab 60 MG/ML Soln injection Commonly known as: PROLIA Inject 60 mg into the skin every 6 (six) months. Administer in upper arm, thigh, or abdomen   docusate sodium 100 MG capsule Commonly known as: COLACE Take 100 mg by mouth at bedtime.   eucerin cream Apply 1 application topically daily.   ezetimibe 10 MG tablet Commonly known as: ZETIA TAKE 1 TABLET ONCE DAILY.   lansoprazole 30 MG disintegrating tablet Commonly known as: PREVACID SOLUTAB Take 30 mg by mouth daily at 12 noon.   levothyroxine 50 MCG tablet  Commonly known as: SYNTHROID Take 50 mcg by mouth daily before breakfast.   lubiprostone 8 MCG capsule Commonly known as: AMITIZA Take 8 mcg by mouth 2 (two) times daily with a meal.   metoprolol tartrate 25 MG tablet Commonly known as: LOPRESSOR TAKE 1 TABLET ONCE DAILY.   mineral oil-hydrophilic petrolatum ointment SPF 30 Apply to lesion on lip twice daily. Twice A Day   multivitamin with minerals Tabs tablet Take 1 tablet by mouth daily.   NONFORMULARY OR COMPOUNDED ITEM Peripheral Neuropathy Cream: Bupivacaine 1%, Doxepin 3%, Gabapentin 6%, Pentoxifylline 3%, Topiramate 1% Order faxed to [facility]   nystatin powder Commonly known as: MYCOSTATIN/NYSTOP Apply topically 2 (two) times daily as needed. What changed:   how much to take  reasons to take this   pramipexole 1 MG tablet Commonly known as: MIRAPEX Take 1 mg by mouth 2 (two) times daily.   PreserVision AREDS 2 Chew Chew by mouth. Once a day   QUEtiapine 25 MG tablet Commonly known as: SEROQUEL Take 12.5 mg by mouth. Q12HRS PRN for anxiety, hallucinations   QUEtiapine 12.5 mg Tabs tablet Commonly known as: SEROQUEL Take 12.5 mg by mouth every morning.   QUEtiapine 25 MG tablet Commonly known as: SEROQUEL Take 25 mg by mouth at bedtime.   rosuvastatin 20 MG tablet Commonly known as: CRESTOR Take 1 tablet (20 mg total) by mouth daily.   senna-docusate 8.6-50 MG tablet Commonly known as: Senokot-S Take 1 tablet by mouth daily.   sertraline 25 MG tablet Commonly known as: ZOLOFT Take 50 mg by mouth daily.   Slow Fe 142 (45 Fe) MG Tbcr Generic drug: Ferrous Sulfate Take 1 tablet by mouth. Daily   Systane 0.4-0.3 % Soln Generic drug: Polyethyl Glycol-Propyl Glycol Apply 1 drop to eye 2 (two) times daily as needed.   Vitamin D 50 MCG (2000 UT) Caps Take 2,000 Units by mouth daily.       Review of Systems  Constitutional: Negative.   HENT: Negative.   Respiratory: Negative.    Cardiovascular: Positive for leg swelling.  Gastrointestinal: Positive for constipation.  Genitourinary: Negative.   Musculoskeletal: Negative.   Neurological: Positive for tremors and  weakness.  Psychiatric/Behavioral: Positive for confusion, dysphoric mood and hallucinations. The patient is nervous/anxious.   All other systems reviewed and are negative.   Immunization History  Administered Date(s) Administered  . Influenza Split 09/09/2013  . Influenza, High Dose Seasonal PF 09/12/2018  . Influenza,inj,Quad PF,6+ Mos 09/11/2016  . Pneumococcal Conjugate-13 04/15/2014  . Pneumococcal Polysaccharide-23 09/28/2005  . Tdap 09/01/2015  . Zoster Recombinat (Shingrix) 06/11/2018, 09/10/2018   Pertinent  Health Maintenance Due  Topic Date Due  . INFLUENZA VACCINE  07/11/2019  . DEXA SCAN  Completed  . PNA vac Low Risk Adult  Completed   Fall Risk  04/23/2019 04/09/2019 10/29/2018 10/27/2018 10/09/2018  Falls in the past year? 0 0 0 1 Yes  Comment - - fell June 2019 - -  Number falls in past yr: 0 0 - 0 1  Injury with Fall? 0 0 - 1 Yes  Comment - - - - -  Risk for fall due to : - - - - -  Risk for fall due to: Comment - - - - -   Functional Status Survey:    Vitals:   10/27/19 1128  BP: 130/60  Pulse: 80  Resp: 20  Temp: 97.8 F (36.6 C)  SpO2: 98%  Weight: 142 lb 8 oz (64.6 kg)  Height: 5\' 2"  (1.575 m)   Body mass index is 26.06 kg/m. Physical Exam  Constitutional:  Well-developed and well-nourished.  HENT:  Head: Normocephalic.  Mouth/Throat: Oropharynx is clear and moist.  Eyes: Pupils are equal, round, and reactive to light.  Neck: Neck supple.  Cardiovascular: Normal rate and normal heart sounds.  Positive for [place]. Pulmonary/Chest: Effort normal and breath sounds normal. No respiratory distress. No wheezes. She has no rales.  Abdominal: Soft. Bowel sounds are normal. No distension. There is no tenderness. There is no rebound.  Musculoskeletal:Mild edema  Bilateral Lymphadenopathy: none Neurological: No Focal deficits Continues to get Confuse like her husband is con=mong to take her back to Apartment Unable to stand without Assist Can do his  transfers Skin: Skin is warm and dry.  Psychiatric: Normal mood and affect. Behavior is normal. Thought content normal.    Labs reviewed: Recent Labs    01/02/19 1311 05/26/19 0906 07/24/19 09/08/19  NA 138 143 142 144  K 4.7 4.1 4.3 3.5  CL 108 110 110  --   CO2 21* 24 24  --   GLUCOSE 118* 97  --   --   BUN 14 13 12 10   CREATININE 1.34* 0.65 0.7 0.7  CALCIUM 9.5 9.0 9.0  --    Recent Labs    01/02/19 1935 07/14/19 0811 07/24/19 09/08/19  AST 24 21 43* 23  ALT 13 7 5* 12  ALKPHOS 56 56 0.4* 46  BILITOT 0.8 0.3  --   --   PROT 6.4* 6.1 5.8  --   ALBUMIN 3.5 4.3 3.7  --    Recent Labs    01/02/19 1311 05/26/19 0906 09/08/19  WBC 10.8* 7.4 7.6  NEUTROABS  --  4,862  --   HGB 9.6* 9.4* 11.1*  HCT 31.4* 29.8* 34*  MCV 92.1 89.5  --   PLT 288 302 245   Lab Results  Component Value Date   TSH 1.62 09/08/2019   No results found for: HGBA1C Lab Results  Component Value Date   CHOL 118 07/14/2019   HDL 50 07/14/2019   LDLCALC 32 07/14/2019   TRIG 182 (H) 07/14/2019   CHOLHDL 2.4  07/14/2019    Significant Diagnostic Results in last 30 days:  No results found.  Assessment/Plan Parkinson's disease ([place]) Stable has appointment with Neurology On Sinemet and Mirapex Unstable gait Wheelchair Dependent Working with therapy Iron deficiency anemia  Will need follow up with GI On Low dose of iron and Hgb is stable  Hallucinations, visual Improved on Seroquel but still gets confused especially in the evenings with Anxiety and trying to leave the facility Does respond to extra PRN Seroquel  Hypothyroidism, unspecified type TSH is Normal in 9/20  Depression, unspecified depression type On Zoloft with PRN Seroquel  Sjogren's syndrome, with unspecified organ involvement  ([place]) Has been stable Not seen any active symptoms Was on Plaquenil before  D/W Her husband will wait for Rheumatology Referal  Chronic constipation Was started on Amitiza and doing well Osteoporosis Due for Prolia Shot    Family/ staff Communication:   Labs/tests ordered:   Total time spent in this patient care encounter was 45 _  minutes; greater than 50% of the visit spent counseling patient and staff, reviewing records , Labs and coordinating care for problems addressed at this encounter.

## 2019-10-28 DIAGNOSIS — R2681 Unsteadiness on feet: Secondary | ICD-10-CM | POA: Diagnosis not present

## 2019-10-28 DIAGNOSIS — M6281 Muscle weakness (generalized): Secondary | ICD-10-CM | POA: Diagnosis not present

## 2019-10-28 DIAGNOSIS — G20 Parkinson's disease: Secondary | ICD-10-CM | POA: Diagnosis not present

## 2019-10-28 DIAGNOSIS — R29898 Other symptoms and signs involving the musculoskeletal system: Secondary | ICD-10-CM | POA: Diagnosis not present

## 2019-10-28 DIAGNOSIS — Z7389 Other problems related to life management difficulty: Secondary | ICD-10-CM | POA: Diagnosis not present

## 2019-10-28 DIAGNOSIS — R278 Other lack of coordination: Secondary | ICD-10-CM | POA: Diagnosis not present

## 2019-10-29 DIAGNOSIS — R278 Other lack of coordination: Secondary | ICD-10-CM | POA: Diagnosis not present

## 2019-10-29 DIAGNOSIS — M6281 Muscle weakness (generalized): Secondary | ICD-10-CM | POA: Diagnosis not present

## 2019-10-29 DIAGNOSIS — R2681 Unsteadiness on feet: Secondary | ICD-10-CM | POA: Diagnosis not present

## 2019-10-29 DIAGNOSIS — G20 Parkinson's disease: Secondary | ICD-10-CM | POA: Diagnosis not present

## 2019-10-29 DIAGNOSIS — Z7389 Other problems related to life management difficulty: Secondary | ICD-10-CM | POA: Diagnosis not present

## 2019-10-29 DIAGNOSIS — R29898 Other symptoms and signs involving the musculoskeletal system: Secondary | ICD-10-CM | POA: Diagnosis not present

## 2019-10-30 DIAGNOSIS — Z7389 Other problems related to life management difficulty: Secondary | ICD-10-CM | POA: Diagnosis not present

## 2019-10-30 DIAGNOSIS — G20 Parkinson's disease: Secondary | ICD-10-CM | POA: Diagnosis not present

## 2019-10-30 DIAGNOSIS — R2681 Unsteadiness on feet: Secondary | ICD-10-CM | POA: Diagnosis not present

## 2019-10-30 DIAGNOSIS — M6281 Muscle weakness (generalized): Secondary | ICD-10-CM | POA: Diagnosis not present

## 2019-10-30 DIAGNOSIS — R278 Other lack of coordination: Secondary | ICD-10-CM | POA: Diagnosis not present

## 2019-10-30 DIAGNOSIS — R29898 Other symptoms and signs involving the musculoskeletal system: Secondary | ICD-10-CM | POA: Diagnosis not present

## 2019-11-02 ENCOUNTER — Encounter: Payer: Self-pay | Admitting: Nurse Practitioner

## 2019-11-02 ENCOUNTER — Non-Acute Institutional Stay (SKILLED_NURSING_FACILITY): Payer: Medicare Other | Admitting: Nurse Practitioner

## 2019-11-02 DIAGNOSIS — F323 Major depressive disorder, single episode, severe with psychotic features: Secondary | ICD-10-CM

## 2019-11-02 DIAGNOSIS — G20 Parkinson's disease: Secondary | ICD-10-CM

## 2019-11-02 DIAGNOSIS — E02 Subclinical iodine-deficiency hypothyroidism: Secondary | ICD-10-CM | POA: Diagnosis not present

## 2019-11-02 NOTE — Assessment & Plan Note (Addendum)
11/01/19 reported the patient always in good mood at beginning shift with patrick her husband in room, after dinner and when he leaves she becomes anxious, agitated, difficulty ot redirect, distraction of numerous things offered (snacks, music, puzzles, news paper, pictures to look at, even toileting. She has hallucinations again, stated Iam hearing a baby crying, hearing a man outside the door chopping wood.  11/02/19 Dr. Lyndel Safe: change Seroquel 25mg  at 7pm, may use prn at night, continue 12.5mg  qam. Continue Sertraline 50mg  qd. May consider Nuplazid if needed. Mr. Salita Sundaram stated he understood and agreed  the plan of care for [facility]

## 2019-11-02 NOTE — Progress Notes (Signed)
Location:   [facility] Room Number: 28 Place of Service:  SNF (31) Provider:  Trayce Maino NP  [name], MD  Patient Care Team: [name], MD as PCP - General (Internal Medicine) [name], MD as PCP - Cardiology (Cardiology) [name], MD (Neurosurgery) [name], MD (Neurology) [name], MD as Consulting Physician (Gastroenterology) [name], MD as Consulting Physician (Urology) Ivanka Kirshner X, NP as Nurse Practitioner (Internal Medicine)  Extended Emergency Contact Information Primary Emergency Contact: [facility] Address: [address] [place]          [place], [place] [address] [name] of [place] Phone: [phone] Mobile Phone: [phone] Relation: Spouse Secondary Emergency Contact: [name] Mobile Phone: [phone] Relation: Daughter Preferred language: [name] Interpreter needed? No  Code Status:  Full Code Goals of care: Advanced Directive information Advanced Directives 09/11/2019  Does Patient Have a Medical Advance Directive? No  Type of Advance Directive -  Does patient want to make changes to medical advance directive? -  Copy of [place] in Chart? -  Would patient like information on creating a medical advance directive? -  Pre-existing out of facility DNR order (yellow form or pink MOST form) -     Chief Complaint  Patient presents with   Acute Visit    psychotic behaviors in pm    HPI:  Pt is a 85 y.o. female seen today for an acute visit for 11/01/19 reported the patient always in good mood at beginning shift with patrick her husband in room, after dinner and when he leaves she becomes anxious, agitated, difficulty ot redirect, distraction of numerous things offered (snacks, music, puzzles, news paper, pictures to look at, even toileting. She has hallucinations again, stated Iam hearing a baby crying, hearing a [name] outside the door chopping wood. The  patient resides in [facility] for safety, care assistance, taking Sertraline 50mg  qd, Quetiapine 12.5mg  qd, prn,  25mg  qd. Parkinson's disease, w/c for mobility, f/u neurology, on Sinemet 25/100mg  I qd, II qhs, Mirapex 1mg  bid. Hypothyroidism, stable, on Levothyroxine 105mcg qd, last TSH 1.62 09/08/19    Past Medical History:  Diagnosis Date   Anemia    Anxiety    Aortic stenosis    moderate by echo 2019   Arthritis    Broken arm    right    Chronic diastolic CHF (congestive heart failure) ([place])    Family history of adverse reaction to anesthesia    pt. states sister vomits   Fibromyalgia    Fracture of thumb 09/01/2015   right   Fracture, foot 09/01/2015   right   GERD (gastroesophageal reflux disease)    H/O hiatal hernia    History of bronchitis    History of kidney stones    Hypercholesterolemia    Hypertension    dr t turner   Hypothyroidism    Neuropathy    Osteoporosis    Parkinson disease ([place])    PVD (peripheral vascular disease) ([facility])    99% stenosis of left anteiror tibial artery, mod stenosis of left distal SFA and popliteal artery followed by [name]   RBBB    noted on EKG 2018   Restless leg    Shortness of breath    with exertion - chronic    Sjogren's disease ([place])    SOB (shortness of breath)    chronic due to diastolic dysfunction, deconditioning, obesity   Tremor    Past Surgical History:  Procedure Laterality Date   ABDOMINAL HYSTERECTOMY     BACK SURGERY     4 back surgeries,   lumbar fusion   CARDIAC CATHETERIZATION  2006   normal   CYSTOSCOPY/URETEROSCOPY/HOLMIUM LASER/STENT PLACEMENT Left 01/06/2019   Procedure: CYSTOSCOPY/RETROGRADE/URETEROSCOPY/HOLMIUM LASER/BASKET RETRIEVAL/STENT PLACEMENT;  Surgeon: Ceasar Mons, MD;  Location: [facility];  Service: Urology;  Laterality: Left;   EYE SURGERY Bilateral    cateracts   falls     variious fall, broken wrist,and toes   FRACTURE SURGERY Right April  2016   Wrist, Pt. fell   kidney stone removal Left 01/20/2019   RIGHT/LEFT HEART CATH AND CORONARY ANGIOGRAPHY N/A 02/05/2018   Procedure: RIGHT/LEFT HEART CATH AND CORONARY ANGIOGRAPHY;  Surgeon: Troy Sine, MD;  Location: [facility];  Service: Cardiovascular;  Laterality: N/A;   SPINAL CORD STIMULATOR INSERTION N/A 09/09/2015   Procedure: LUMBAR SPINAL CORD STIMULATOR INSERTION;  Surgeon: Clydell Hakim, MD;  Location: [facility];  Service: Neurosurgery;  Laterality: N/A;  LUMBAR SPINAL CORD STIMULATOR INSERTION   SPINE SURGERY  April 2013   Back X's 4   TOTAL HIP ARTHROPLASTY     right   WRIST FRACTURE SURGERY Bilateral     Allergies  Allergen Reactions   Adrenalone    Lactose Other (See Comments)    abd pain, lactose intolerant   Morphine Other (See Comments)   Sulfa Antibiotics Other (See Comments)    Headache, very sick   Codeine Other (See Comments)    Reaction:  Headaches and nightmares    Lactose Intolerance (Gi) Nausea And Vomiting   Latex Rash   Lyrica [Pregabalin] Swelling and Other (See Comments)    Reaction:  Leg swelling   Other Other (See Comments)    Pt states that pain medications give her nightmares.     Plaquenil [Hydroxychloroquine Sulfate] Other (See Comments)    Reaction:  GI upset    Reglan [Metoclopramide] Other (See Comments)    Reaction:  GI upset    Requip [Ropinirole Hcl] Other (See Comments)    Reaction:  GI upset    Septra [Sulfamethoxazole-Trimethoprim] Nausea And Vomiting   Shellfish Allergy Nausea And Vomiting    Allergies as of 11/02/2019      Reactions   Adrenalone    Lactose Other (See Comments)   abd pain, lactose intolerant   Morphine Other (See Comments)   Sulfa Antibiotics Other (See Comments)   Headache, very sick   Codeine Other (See Comments)   Reaction:  Headaches and nightmares    Lactose Intolerance (gi) Nausea And Vomiting   Latex Rash   Lyrica [pregabalin] Swelling, Other (See Comments)    Reaction:  Leg swelling   Other Other (See Comments)   Pt states that pain medications give her nightmares.     Plaquenil [hydroxychloroquine Sulfate] Other (See Comments)   Reaction:  GI upset    Reglan [metoclopramide] Other (See Comments)   Reaction:  GI upset    Requip [ropinirole Hcl] Other (See Comments)   Reaction:  GI upset    Septra [sulfamethoxazole-trimethoprim] Nausea And Vomiting   Shellfish Allergy Nausea And Vomiting      Medication List       Accurate as of November 02, 2019  3:13 PM. If you have any questions, ask your nurse or doctor.        acetaminophen 500 MG tablet Commonly known as: TYLENOL Take 500 mg by mouth. Once A Day - PRN   AeroChamber MV inhaler  by Other route. Use as instructed as needed   albuterol 108 (90 Base) MCG/ACT inhaler Commonly known as: VENTOLIN HFA Inhale 2 puffs into the lungs every 6 (six) hours as needed for wheezing or shortness of breath.   aspirin EC 81 MG tablet Take 81 mg by mouth at bedtime.   CALCIUM+D3 PO Take by mouth daily. 500-200; amt: 1 tablet; oral once a day   carbidopa-levodopa 25-100 MG tablet Commonly known as: SINEMET IR Take 2 tablets by mouth at bedtime.   carbidopa-levodopa 25-100 MG tablet Commonly known as: SINEMET IR Take 1 tablet by mouth. Once a day   denosumab 60 MG/ML Soln injection Commonly known as: PROLIA Inject 60 mg into the skin every 6 (six) months. Administer in upper arm, thigh, or abdomen   docusate sodium 100 MG capsule Commonly known as: COLACE Take 100 mg by mouth at bedtime.   eucerin cream Apply 1 application topically daily.   ezetimibe 10 MG tablet Commonly known as: ZETIA TAKE 1 TABLET ONCE DAILY.   lansoprazole 30 MG disintegrating tablet Commonly known as: PREVACID SOLUTAB Take 30 mg by mouth daily at 12 noon.   levothyroxine 50 MCG tablet Commonly known as: SYNTHROID Take 50 mcg by mouth daily before breakfast.   lubiprostone 8 MCG capsule Commonly  known as: AMITIZA Take 8 mcg by mouth 2 (two) times daily with a meal.   metoprolol tartrate 25 MG tablet Commonly known as: LOPRESSOR TAKE 1 TABLET ONCE DAILY.   mineral oil-hydrophilic petrolatum ointment SPF 30 Apply to lesion on lip twice daily. Twice A Day   multivitamin with minerals Tabs tablet Take 1 tablet by mouth daily.   NONFORMULARY OR COMPOUNDED ITEM Peripheral Neuropathy Cream: Bupivacaine 1%, Doxepin 3%, Gabapentin 6%, Pentoxifylline 3%, Topiramate 1% Order faxed to [facility]   nystatin powder Commonly known as: MYCOSTATIN/NYSTOP Apply topically 2 (two) times daily as needed. What changed:   how much to take  reasons to take this   pramipexole 1 MG tablet Commonly known as: MIRAPEX Take 1 mg by mouth 2 (two) times daily.   PreserVision AREDS 2 Chew Chew by mouth. Once a day   QUEtiapine 25 MG tablet Commonly known as: SEROQUEL Take 12.5 mg by mouth. Q12HRS PRN for anxiety, hallucinations   QUEtiapine 12.5 mg Tabs tablet Commonly known as: SEROQUEL Take 12.5 mg by mouth every morning.   QUEtiapine 25 MG tablet Commonly known as: SEROQUEL Take 25 mg by mouth at bedtime.   rosuvastatin 20 MG tablet Commonly known as: CRESTOR Take 1 tablet (20 mg total) by mouth daily.   senna-docusate 8.6-50 MG tablet Commonly known as: Senokot-S Take 1 tablet by mouth daily.   sertraline 25 MG tablet Commonly known as: ZOLOFT Take 50 mg by mouth daily.   Slow Fe 142 (45 Fe) MG Tbcr Generic drug: Ferrous Sulfate Take 1 tablet by mouth. Daily   Systane 0.4-0.3 % Soln Generic drug: Polyethyl Glycol-Propyl Glycol Apply 1 drop to eye 2 (two) times daily as needed.   Vitamin D 50 MCG (2000 UT) Caps Take 2,000 Units by mouth daily.      ROS was provided with assistance of staff.  Review of Systems  Constitutional: Negative for activity change, appetite change, chills, diaphoresis, fatigue, fever and unexpected weight change.  HENT: Positive  for hearing loss. Negative for congestion, trouble swallowing and voice change.   Respiratory: Negative for cough, shortness of breath and wheezing.   Cardiovascular: Positive for leg swelling. Negative for chest pain  and palpitations.  Gastrointestinal: Negative for abdominal distention, abdominal pain, constipation, diarrhea, nausea and vomiting.  Genitourinary: Negative for difficulty urinating, dysuria and urgency.  Musculoskeletal: Positive for back pain and gait problem.  Skin: Negative for color change and pallor.  Neurological: Positive for tremors. Negative for dizziness, speech difficulty, weakness and headaches.       Memory lapses.   Psychiatric/Behavioral: Positive for agitation, behavioral problems, confusion, dysphoric mood and hallucinations. Negative for sleep disturbance. The patient is nervous/anxious.     Immunization History  Administered Date(s) Administered   Influenza Split 09/09/2013   Influenza, High Dose Seasonal PF 09/12/2018   Influenza,inj,Quad PF,6+ Mos 09/11/2016   Pneumococcal Conjugate-13 04/15/2014   Pneumococcal Polysaccharide-23 09/28/2005   Tdap 09/01/2015   Zoster Recombinat (Shingrix) 06/11/2018, 09/10/2018   Pertinent  Health Maintenance Due  Topic Date Due   INFLUENZA VACCINE  07/11/2019   DEXA SCAN  Completed   PNA vac Low Risk Adult  Completed   Fall Risk  04/23/2019 04/09/2019 10/29/2018 10/27/2018 10/09/2018  Falls in the past year? 0 0 0 1 Yes  Comment - - fell June 2019 - -  Number falls in past yr: 0 0 - 0 1  Injury with Fall? 0 0 - 1 Yes  Comment - - - - -  Risk for fall due to : - - - - -  Risk for fall due to: Comment - - - - -   Functional Status Survey:    Vitals:   11/02/19 1226  BP: 130/60  Pulse: 80  Resp: 20  Temp: (!) 96.9 F (36.1 C)  SpO2: 95%  Weight: 142 lb 8 oz (64.6 kg)  Height: 5\' 2"  (1.575 m)   Body mass index is 26.06 kg/m. Physical Exam Vitals signs and nursing note reviewed.    Constitutional:      General: She is not in acute distress.    Appearance: Normal appearance. She is not ill-appearing, toxic-appearing or diaphoretic.  HENT:     Head: Normocephalic and atraumatic.     Nose: Nose normal.     Mouth/Throat:     Mouth: Mucous membranes are moist.  Eyes:     Extraocular Movements: Extraocular movements intact.     Conjunctiva/sclera: Conjunctivae normal.     Pupils: Pupils are equal, round, and reactive to light.  Neck:     Musculoskeletal: Normal range of motion and neck supple.  Cardiovascular:     Rate and Rhythm: Normal rate and regular rhythm.     Heart sounds: Murmur present.  Pulmonary:     Breath sounds: Rales present.     Comments: Bibasilar rales.  Abdominal:     General: Bowel sounds are normal. There is no distension.     Palpations: Abdomen is soft.     Tenderness: There is no abdominal tenderness. There is no right CVA tenderness, left CVA tenderness, guarding or rebound.  Musculoskeletal:     Right lower leg: Edema present.     Left lower leg: Edema present.     Comments: Trace edema BLE, w/c for mobility.   Skin:    General: Skin is warm and dry.  Neurological:     General: No focal deficit present.     Mental Status: She is alert and oriented to person, place, and time. Mental status is at baseline.     Motor: No weakness.     Coordination: Coordination normal.     Gait: Gait abnormal.  Psychiatric:     Comments:  Sad facial looks.      Labs reviewed: Recent Labs    01/02/19 1311 05/26/19 0906 07/24/19 09/08/19  NA 138 143 142 144  K 4.7 4.1 4.3 3.5  CL 108 110 110  --   CO2 21* 24 24  --   GLUCOSE 118* 97  --   --   BUN 14 13 12 10   CREATININE 1.34* 0.65 0.7 0.7  CALCIUM 9.5 9.0 9.0  --    Recent Labs    01/02/19 1935 07/14/19 0811 07/24/19 09/08/19  AST 24 21 43* 23  ALT 13 7 5* 12  ALKPHOS 56 56 0.4* 46  BILITOT 0.8 0.3  --   --   PROT 6.4* 6.1 5.8  --   ALBUMIN 3.5 4.3 3.7  --    Recent Labs     01/02/19 1311 05/26/19 0906 09/08/19  WBC 10.8* 7.4 7.6  NEUTROABS  --  4,862  --   HGB 9.6* 9.4* 11.1*  HCT 31.4* 29.8* 34*  MCV 92.1 89.5  --   PLT 288 302 245   Lab Results  Component Value Date   TSH 1.62 09/08/2019   No results found for: HGBA1C Lab Results  Component Value Date   CHOL 118 07/14/2019   HDL 50 07/14/2019   LDLCALC 32 07/14/2019   TRIG 182 (H) 07/14/2019   CHOLHDL 2.4 07/14/2019    Significant Diagnostic Results in last 30 days:  No results found.  Assessment/Plan Depression, psychotic ([place]) 11/01/19 reported the patient always in good mood at beginning shift with patrick her husband in room, after dinner and when he leaves she becomes anxious, agitated, difficulty ot redirect, distraction of numerous things offered (snacks, music, puzzles, news paper, pictures to look at, even toileting. She has hallucinations again, stated Iam hearing a baby crying, hearing a Pepper Kerrick outside the door chopping wood.  11/02/19 Dr. Lyndel Safe: change Seroquel 25mg  at 7pm, may use prn at night, continue 12.5mg  qam. Continue Sertraline 50mg  qd. May consider Nuplazid if needed. Mr. Wren Deberry stated he understood and agreed  the plan of care for [name]   Parkinson's disease Continue Sinemet, Mirapex, f/u Neurology.   Hypothyroidism Stable, last TSH 1.62 09/08/19, continue Levothyroxine 30mcg qd.     Family/ staff Communication: plan of care reviewed with the patient and charge nurse.   Labs/tests ordered:  None   Time spend 25 minutes.

## 2019-11-02 NOTE — Assessment & Plan Note (Signed)
Stable, last TSH 1.62 09/08/19, continue Levothyroxine 21mcg qd.

## 2019-11-02 NOTE — Assessment & Plan Note (Addendum)
Continue Sinemet, Mirapex, f/u Neurology.

## 2019-11-03 ENCOUNTER — Encounter: Payer: Self-pay | Admitting: Nurse Practitioner

## 2019-11-03 DIAGNOSIS — G20 Parkinson's disease: Secondary | ICD-10-CM | POA: Diagnosis not present

## 2019-11-03 DIAGNOSIS — R278 Other lack of coordination: Secondary | ICD-10-CM | POA: Diagnosis not present

## 2019-11-03 DIAGNOSIS — M6281 Muscle weakness (generalized): Secondary | ICD-10-CM | POA: Diagnosis not present

## 2019-11-03 DIAGNOSIS — R2681 Unsteadiness on feet: Secondary | ICD-10-CM | POA: Diagnosis not present

## 2019-11-03 DIAGNOSIS — R29898 Other symptoms and signs involving the musculoskeletal system: Secondary | ICD-10-CM | POA: Diagnosis not present

## 2019-11-03 DIAGNOSIS — Z7389 Other problems related to life management difficulty: Secondary | ICD-10-CM | POA: Diagnosis not present

## 2019-11-03 NOTE — Progress Notes (Signed)
This encounter was created in error - please disregard.

## 2019-11-04 DIAGNOSIS — M6281 Muscle weakness (generalized): Secondary | ICD-10-CM | POA: Diagnosis not present

## 2019-11-04 DIAGNOSIS — G20 Parkinson's disease: Secondary | ICD-10-CM | POA: Diagnosis not present

## 2019-11-04 DIAGNOSIS — R278 Other lack of coordination: Secondary | ICD-10-CM | POA: Diagnosis not present

## 2019-11-04 DIAGNOSIS — R2681 Unsteadiness on feet: Secondary | ICD-10-CM | POA: Diagnosis not present

## 2019-11-04 DIAGNOSIS — Z7389 Other problems related to life management difficulty: Secondary | ICD-10-CM | POA: Diagnosis not present

## 2019-11-04 DIAGNOSIS — R29898 Other symptoms and signs involving the musculoskeletal system: Secondary | ICD-10-CM | POA: Diagnosis not present

## 2019-11-09 ENCOUNTER — Non-Acute Institutional Stay (SKILLED_NURSING_FACILITY): Payer: Medicare Other | Admitting: Nurse Practitioner

## 2019-11-09 DIAGNOSIS — D500 Iron deficiency anemia secondary to blood loss (chronic): Secondary | ICD-10-CM | POA: Diagnosis not present

## 2019-11-09 DIAGNOSIS — F323 Major depressive disorder, single episode, severe with psychotic features: Secondary | ICD-10-CM

## 2019-11-09 DIAGNOSIS — E02 Subclinical iodine-deficiency hypothyroidism: Secondary | ICD-10-CM

## 2019-11-09 DIAGNOSIS — K5909 Other constipation: Secondary | ICD-10-CM

## 2019-11-09 DIAGNOSIS — G20 Parkinson's disease: Secondary | ICD-10-CM | POA: Diagnosis not present

## 2019-11-09 DIAGNOSIS — I10 Essential (primary) hypertension: Secondary | ICD-10-CM

## 2019-11-10 DIAGNOSIS — Z20828 Contact with and (suspected) exposure to other viral communicable diseases: Secondary | ICD-10-CM | POA: Diagnosis not present

## 2019-11-10 DIAGNOSIS — R278 Other lack of coordination: Secondary | ICD-10-CM | POA: Diagnosis not present

## 2019-11-10 DIAGNOSIS — M6281 Muscle weakness (generalized): Secondary | ICD-10-CM | POA: Diagnosis not present

## 2019-11-10 DIAGNOSIS — G20 Parkinson's disease: Secondary | ICD-10-CM | POA: Diagnosis not present

## 2019-11-10 DIAGNOSIS — Z7389 Other problems related to life management difficulty: Secondary | ICD-10-CM | POA: Diagnosis not present

## 2019-11-10 DIAGNOSIS — R2681 Unsteadiness on feet: Secondary | ICD-10-CM | POA: Diagnosis not present

## 2019-11-10 DIAGNOSIS — R29898 Other symptoms and signs involving the musculoskeletal system: Secondary | ICD-10-CM | POA: Diagnosis not present

## 2019-11-11 ENCOUNTER — Encounter: Payer: Self-pay | Admitting: Nurse Practitioner

## 2019-11-11 DIAGNOSIS — R278 Other lack of coordination: Secondary | ICD-10-CM | POA: Diagnosis not present

## 2019-11-11 DIAGNOSIS — G20 Parkinson's disease: Secondary | ICD-10-CM | POA: Diagnosis not present

## 2019-11-11 DIAGNOSIS — Z7389 Other problems related to life management difficulty: Secondary | ICD-10-CM | POA: Diagnosis not present

## 2019-11-11 DIAGNOSIS — M6281 Muscle weakness (generalized): Secondary | ICD-10-CM | POA: Diagnosis not present

## 2019-11-11 DIAGNOSIS — R29898 Other symptoms and signs involving the musculoskeletal system: Secondary | ICD-10-CM | POA: Diagnosis not present

## 2019-11-11 DIAGNOSIS — Z20828 Contact with and (suspected) exposure to other viral communicable diseases: Secondary | ICD-10-CM | POA: Diagnosis not present

## 2019-11-11 NOTE — Assessment & Plan Note (Signed)
Blood pressure is controlled, continue Metoprolol. 

## 2019-11-11 NOTE — Assessment & Plan Note (Signed)
Stable, continue Levothyroxine. TSH wnl 08/2019 

## 2019-11-11 NOTE — Assessment & Plan Note (Signed)
Stable, continue Fe,  Hgb 11.1 09/08/19

## 2019-11-11 NOTE — Assessment & Plan Note (Signed)
Will increase Seroquel 25mg  bid, continue Sertraline 50mg  qd.

## 2019-11-11 NOTE — Progress Notes (Signed)
Location:   [facility] Room Number: 56 Place of Service:  SNF (31) Provider: Lennie Odor Kristi Hyer NP  [name], MD  Patient Care Team: [name], MD as PCP - General (Internal Medicine) [name], MD as PCP - Cardiology (Cardiology) [name], MD (Neurosurgery) [name], MD (Neurology) [name], MD as Consulting Physician (Gastroenterology) [name], MD as Consulting Physician (Urology) Jawanza Zambito X, NP as Nurse Practitioner (Internal Medicine)  Extended Emergency Contact Information Primary Emergency Contact: [facility] Address: [address]          [place], [place] [address] [name] of [place] Phone: [phone] Mobile Phone: [phone] Relation: Spouse Secondary Emergency Contact: [name] Mobile Phone: [phone] Relation: Daughter Preferred language: [name] Interpreter needed? No  Code Status:  DNR Goals of care: Advanced Directive information Advanced Directives 09/11/2019  Does Patient Have a Medical Advance Directive? No  Type of Advance Directive -  Does patient want to make changes to medical advance directive? -  Copy of [place] in Chart? -  Would patient like information on creating a medical advance directive? -  Pre-existing out of facility DNR order (yellow form or pink MOST form) -     Chief Complaint  Patient presents with  . Medical Management of Chronic Issues    HPI:  Pt is a 85 y.o. female seen today for medical management of chronic diseases.    The patient resides in SNF [facility] for safety, care assistance, her hallucination, delusions, paranoia symptoms are not well controlled, prn Seroquel 12.5mg  daily used 6 in the past 2 weeks, she also takes 25mg  qpm, 12.5mg  qd,  Sertraline 50mg   Constipation, stable, on Senokot S qd. Parkinson's, on Mirapex 1mg  bid, Sinemet 25/100mg  II qhs, I qd. HTN, blood pressure is controlled, on Metoprolol 25mg  qd.  Hypothyroidism, on Levothyroxine 108mcg. Anemia, stable, on Fe qd.    Past Medical History:  Diagnosis Date  . Anemia   . Anxiety   . Aortic stenosis    moderate by echo 2019  . Arthritis   . Broken arm    right   . Chronic diastolic CHF (congestive heart failure) ([place])   . Family history of adverse reaction to anesthesia    pt. states sister vomits  . Fibromyalgia   . Fracture of thumb 09/01/2015   right  . Fracture, foot 09/01/2015   right  . GERD (gastroesophageal reflux disease)   . H/O hiatal hernia   . History of bronchitis   . History of kidney stones   . Hypercholesterolemia   . Hypertension    dr t turner  . Hypothyroidism   . Neuropathy   . Osteoporosis   . Parkinson disease ([place])   . PVD (peripheral vascular disease) (HCC)    99% stenosis of left anteiror tibial artery, mod stenosis of left distal SFA and popliteal artery followed by [name]  . RBBB    noted on EKG 2018  . Restless leg   . Shortness of breath    with exertion - chronic   . Sjogren's disease ([place])   . SOB (shortness of breath)    chronic due to diastolic dysfunction, deconditioning, obesity  . Tremor    Past Surgical History:  Procedure Laterality Date  . ABDOMINAL HYSTERECTOMY    . BACK SURGERY     4 back surgeries,   lumbar fusion  . CARDIAC CATHETERIZATION  2006   normal  . CYSTOSCOPY/URETEROSCOPY/HOLMIUM LASER/STENT  PLACEMENT Left 01/06/2019   Procedure: CYSTOSCOPY/RETROGRADE/URETEROSCOPY/HOLMIUM LASER/BASKET RETRIEVAL/STENT PLACEMENT;  Surgeon: Ceasar Mons, MD;  Location: [facility];  Service: Urology;  Laterality: Left;  . EYE SURGERY Bilateral    cateracts  . falls     variious fall, broken wrist,and toes  . FRACTURE SURGERY Right April 2016   Wrist, Pt. fell  . kidney stone removal Left 01/20/2019  . RIGHT/LEFT HEART CATH AND CORONARY ANGIOGRAPHY N/A 02/05/2018   Procedure: RIGHT/LEFT HEART CATH AND CORONARY ANGIOGRAPHY;  Surgeon: Troy Sine, MD;  Location: [facility] CV LAB;  Service: Cardiovascular;  Laterality: N/A;  . SPINAL CORD STIMULATOR INSERTION N/A 09/09/2015   Procedure: LUMBAR SPINAL CORD STIMULATOR INSERTION;  Surgeon: Clydell Hakim, MD;  Location: [facility];  Service: Neurosurgery;  Laterality: N/A;  LUMBAR SPINAL CORD STIMULATOR INSERTION  . SPINE SURGERY  April 2013   Back X's 4  . TOTAL HIP ARTHROPLASTY     right  . WRIST FRACTURE SURGERY Bilateral     Allergies  Allergen Reactions  . Adrenalone   . Lactose Other (See Comments)    abd pain, lactose intolerant  . Morphine Other (See Comments)  . Sulfa Antibiotics Other (See Comments)    Headache, very sick  . Codeine Other (See Comments)    Reaction:  Headaches and nightmares   . Lactose Intolerance (Gi) Nausea And Vomiting  . Latex Rash  . Lyrica [Pregabalin] Swelling and Other (See Comments)    Reaction:  Leg swelling  . Other Other (See Comments)    Pt states that pain medications give her nightmares.    . Plaquenil [Hydroxychloroquine Sulfate] Other (See Comments)    Reaction:  GI upset   . Reglan [Metoclopramide] Other (See Comments)    Reaction:  GI upset   . Requip [Ropinirole Hcl] Other (See Comments)    Reaction:  GI upset   . Septra [Sulfamethoxazole-Trimethoprim] Nausea And Vomiting  . Shellfish Allergy Nausea And Vomiting    Allergies as of 11/09/2019      Reactions   Adrenalone    Lactose Other (See Comments)   abd pain, lactose intolerant   Morphine Other (See Comments)   Sulfa Antibiotics Other (See Comments)   Headache, very sick   Codeine Other (See Comments)   Reaction:  Headaches and nightmares    Lactose Intolerance (gi) Nausea And Vomiting   Latex Rash   Lyrica [pregabalin] Swelling, Other (See Comments)   Reaction:  Leg swelling   Other Other (See Comments)   Pt states that pain medications give her nightmares.     Plaquenil [hydroxychloroquine Sulfate] Other (See Comments)   Reaction:  GI upset    Reglan [metoclopramide] Other  (See Comments)   Reaction:  GI upset    Requip [ropinirole Hcl] Other (See Comments)   Reaction:  GI upset    Septra [sulfamethoxazole-trimethoprim] Nausea And Vomiting   Shellfish Allergy Nausea And Vomiting      Medication List       Accurate as of November 09, 2019 11:59 PM. If you have any questions, ask your nurse or doctor.        acetaminophen 500 MG tablet Commonly known as: TYLENOL Take 500 mg by mouth. Once A Day - PRN   AeroChamber MV inhaler by Other route. Use as instructed as needed   albuterol 108 (90 Base) MCG/ACT inhaler Commonly known as: VENTOLIN HFA Inhale 2 puffs into the lungs every 6 (six) hours as needed for wheezing or shortness of  breath.   aspirin EC 81 MG tablet Take 81 mg by mouth at bedtime.   CALCIUM+D3 PO Take by mouth daily. 500-200; amt: 1 tablet; oral once a day   carbidopa-levodopa 25-100 MG tablet Commonly known as: SINEMET IR Take 2 tablets by mouth at bedtime.   carbidopa-levodopa 25-100 MG tablet Commonly known as: SINEMET IR Take 1 tablet by mouth. Once a day   denosumab 60 MG/ML Soln injection Commonly known as: PROLIA Inject 60 mg into the skin every 6 (six) months. Administer in upper arm, thigh, or abdomen   docusate sodium 100 MG capsule Commonly known as: COLACE Take 100 mg by mouth at bedtime.   eucerin cream Apply 1 application topically daily.   ezetimibe 10 MG tablet Commonly known as: ZETIA TAKE 1 TABLET ONCE DAILY.   lansoprazole 30 MG disintegrating tablet Commonly known as: PREVACID SOLUTAB Take 30 mg by mouth daily at 12 noon.   levothyroxine 50 MCG tablet Commonly known as: SYNTHROID Take 50 mcg by mouth daily before breakfast.   lubiprostone 8 MCG capsule Commonly known as: AMITIZA Take 8 mcg by mouth 2 (two) times daily with a meal.   metoprolol tartrate 25 MG tablet Commonly known as: LOPRESSOR TAKE 1 TABLET ONCE DAILY.   mineral oil-hydrophilic petrolatum ointment SPF 30 Apply to  lesion on lip twice daily. Twice A Day   multivitamin with minerals Tabs tablet Take 1 tablet by mouth daily.   NONFORMULARY OR COMPOUNDED ITEM Peripheral Neuropathy Cream: Bupivacaine 1%, Doxepin 3%, Gabapentin 6%, Pentoxifylline 3%, Topiramate 1% Order faxed to [facility]   nystatin powder Commonly known as: MYCOSTATIN/NYSTOP Apply topically 2 (two) times daily as needed. What changed:   how much to take  reasons to take this   pramipexole 1 MG tablet Commonly known as: MIRAPEX Take 1 mg by mouth 2 (two) times daily.   PreserVision AREDS 2 Chew Chew by mouth. Once a day   QUEtiapine 25 MG tablet Commonly known as: SEROQUEL Take 12.5 mg by mouth. Q12HRS PRN for anxiety, hallucinations   QUEtiapine 12.5 mg Tabs tablet Commonly known as: SEROQUEL Take 12.5 mg by mouth every morning.   QUEtiapine 25 MG tablet Commonly known as: SEROQUEL Take 25 mg by mouth at bedtime.   rosuvastatin 20 MG tablet Commonly known as: CRESTOR Take 1 tablet (20 mg total) by mouth daily.   senna-docusate 8.6-50 MG tablet Commonly known as: Senokot-S Take 1 tablet by mouth daily.   sertraline 25 MG tablet Commonly known as: ZOLOFT Take 50 mg by mouth daily.   Slow Fe 142 (45 Fe) MG Tbcr Generic drug: Ferrous Sulfate Take 1 tablet by mouth. Daily   Systane 0.4-0.3 % Soln Generic drug: Polyethyl Glycol-Propyl Glycol Apply 1 drop to eye 2 (two) times daily as needed.   Vitamin D 50 MCG (2000 UT) Caps Take 2,000 Units by mouth daily.      ROS was provided with assistance of staff.  Review of Systems  Constitutional: Negative for activity change, appetite change, chills, diaphoresis, fatigue and fever.  HENT: Positive for hearing loss. Negative for congestion and voice change.   Respiratory: Negative for cough, shortness of breath and wheezing.   Cardiovascular: Positive for leg swelling. Negative for chest pain and palpitations.  Gastrointestinal: Negative for  abdominal distention, abdominal pain, constipation, diarrhea, nausea and vomiting.  Genitourinary: Negative for difficulty urinating, dysuria and urgency.  Musculoskeletal: Positive for arthralgias, back pain and gait problem.  Skin: Negative for color change and pallor.  Neurological: Positive for tremors. Negative for dizziness, speech difficulty, weakness and headaches.       Dementia  Psychiatric/Behavioral: Positive for agitation, behavioral problems, confusion and hallucinations. Negative for sleep disturbance. The patient is nervous/anxious.     Immunization History  Administered Date(s) Administered  . Influenza Split 09/09/2013  . Influenza, High Dose Seasonal PF 09/12/2018  . Influenza,inj,Quad PF,6+ Mos 09/11/2016  . Pneumococcal Conjugate-13 04/15/2014  . Pneumococcal Polysaccharide-23 09/28/2005  . Tdap 09/01/2015  . Zoster Recombinat (Shingrix) 06/11/2018, 09/10/2018   Pertinent  Health Maintenance Due  Topic Date Due  . INFLUENZA VACCINE  Completed  . DEXA SCAN  Completed  . PNA vac Low Risk Adult  Completed   Fall Risk  04/23/2019 04/09/2019 10/29/2018 10/27/2018 10/09/2018  Falls in the past year? 0 0 0 1 Yes  Comment - - fell June 2019 - -  Number falls in past yr: 0 0 - 0 1  Injury with Fall? 0 0 - 1 Yes  Comment - - - - -  Risk for fall due to : - - - - -  Risk for fall due to: Comment - - - - -   Functional Status Survey:    Vitals:   11/09/19 1631  BP: 122/60  Pulse: 70  Resp: 20  Temp: (!) 97 F (36.1 C)  SpO2: 96%   There is no height or weight on file to calculate BMI. Physical Exam Vitals signs and nursing note reviewed.  Constitutional:      General: She is not in acute distress.    Appearance: Normal appearance. She is not ill-appearing, toxic-appearing or diaphoretic.  HENT:     Head: Normocephalic and atraumatic.     Nose: Nose normal.     Mouth/Throat:     Mouth: Mucous membranes are moist.  Eyes:     Extraocular Movements:  Extraocular movements intact.     Conjunctiva/sclera: Conjunctivae normal.     Pupils: Pupils are equal, round, and reactive to light.  Neck:     Musculoskeletal: Normal range of motion and neck supple.  Cardiovascular:     Rate and Rhythm: Normal rate and regular rhythm.     Heart sounds: Murmur present.  Pulmonary:     Breath sounds: Rales present.     Comments: Bibasilar rales.  Abdominal:     General: Bowel sounds are normal. There is no distension.     Palpations: Abdomen is soft.     Tenderness: There is no abdominal tenderness. There is no right CVA tenderness, left CVA tenderness, guarding or rebound.  Musculoskeletal:     Right lower leg: Edema present.     Left lower leg: Edema present.     Comments: Trace edema BLE  Skin:    General: Skin is warm and dry.  Neurological:     General: No focal deficit present.     Mental Status: She is alert. Mental status is at baseline.     Motor: No weakness.     Coordination: Coordination normal.     Gait: Gait abnormal.     Comments: Oriented to person, place.   Psychiatric:     Comments: Confused, flat affect     Labs reviewed: Recent Labs    01/02/19 1311 05/26/19 0906 07/24/19 09/08/19  NA 138 143 142 144  K 4.7 4.1 4.3 3.5  CL 108 110 110  --   CO2 21* 24 24  --   GLUCOSE 118* 97  --   --  BUN 14 13 12 10   CREATININE 1.34* 0.65 0.7 0.7  CALCIUM 9.5 9.0 9.0  --    Recent Labs    01/02/19 1935 07/14/19 0811 07/24/19 09/08/19  AST 24 21 43* 23  ALT 13 7 5* 12  ALKPHOS 56 56 0.4* 46  BILITOT 0.8 0.3  --   --   PROT 6.4* 6.1 5.8  --   ALBUMIN 3.5 4.3 3.7  --    Recent Labs    01/02/19 1311 05/26/19 0906 09/08/19  WBC 10.8* 7.4 7.6  NEUTROABS  --  4,862  --   HGB 9.6* 9.4* 11.1*  HCT 31.4* 29.8* 34*  MCV 92.1 89.5  --   PLT 288 302 245   Lab Results  Component Value Date   TSH 1.62 09/08/2019   No results found for: HGBA1C Lab Results  Component Value Date   CHOL 118 07/14/2019   HDL 50  07/14/2019   LDLCALC 32 07/14/2019   TRIG 182 (H) 07/14/2019   CHOLHDL 2.4 07/14/2019    Significant Diagnostic Results in last 30 days:  No results found.  Assessment/Plan  Depression, psychotic (HCC) Will increase Seroquel 25mg  bid, continue Sertraline 50mg  qd.   Chronic constipation Stable, continue Senokot S qd.   Parkinson's disease F/u Neurology, continue Mirapex, Sinemet.   Hypertension Blood pressure is controlled, continue Metoprolol   Hypothyroidism Stable, continue Levothyroxine. TSH wnl 08/2019  Iron deficiency anemia Stable, continue Fe,  Hgb 11.1 09/08/19   Family/ staff Communication: plan of care reviewed with the patient and charge nurse.   Labs/tests ordered: none  Time spend 25 minutes.

## 2019-11-11 NOTE — Assessment & Plan Note (Signed)
F/u Neurology, continue Mirapex, Sinemet.

## 2019-11-11 NOTE — Assessment & Plan Note (Signed)
Stable, continue Senokot S qd.  

## 2019-11-12 DIAGNOSIS — M6281 Muscle weakness (generalized): Secondary | ICD-10-CM | POA: Diagnosis not present

## 2019-11-12 DIAGNOSIS — Z20828 Contact with and (suspected) exposure to other viral communicable diseases: Secondary | ICD-10-CM | POA: Diagnosis not present

## 2019-11-12 DIAGNOSIS — Z7389 Other problems related to life management difficulty: Secondary | ICD-10-CM | POA: Diagnosis not present

## 2019-11-12 DIAGNOSIS — G20 Parkinson's disease: Secondary | ICD-10-CM | POA: Diagnosis not present

## 2019-11-12 DIAGNOSIS — R278 Other lack of coordination: Secondary | ICD-10-CM | POA: Diagnosis not present

## 2019-11-12 DIAGNOSIS — R29898 Other symptoms and signs involving the musculoskeletal system: Secondary | ICD-10-CM | POA: Diagnosis not present

## 2019-11-13 DIAGNOSIS — F060 Psychotic disorder with hallucinations due to known physiological condition: Secondary | ICD-10-CM | POA: Diagnosis not present

## 2019-11-16 ENCOUNTER — Encounter: Payer: Self-pay | Admitting: Diagnostic Neuroimaging

## 2019-11-16 ENCOUNTER — Ambulatory Visit (INDEPENDENT_AMBULATORY_CARE_PROVIDER_SITE_OTHER): Payer: Medicare Other | Admitting: Diagnostic Neuroimaging

## 2019-11-16 ENCOUNTER — Other Ambulatory Visit: Payer: Self-pay

## 2019-11-16 VITALS — BP 125/62 | HR 76 | Temp 97.2°F

## 2019-11-16 DIAGNOSIS — G20 Parkinson's disease: Secondary | ICD-10-CM

## 2019-11-16 DIAGNOSIS — R441 Visual hallucinations: Secondary | ICD-10-CM | POA: Diagnosis not present

## 2019-11-16 DIAGNOSIS — R269 Unspecified abnormalities of gait and mobility: Secondary | ICD-10-CM | POA: Diagnosis not present

## 2019-11-16 DIAGNOSIS — R413 Other amnesia: Secondary | ICD-10-CM | POA: Diagnosis not present

## 2019-11-16 NOTE — Progress Notes (Signed)
Chief Complaint  Patient presents with   Follow-up    Parkinson disease follow up room 6 with  [name]  husband pt at [facility]      History of Present Illness:  UPDATE (11/16/19, VRP): Since last visit: - continues hallucinations and delusions; now in SNF at [facility].  - having some knee pain and gait difficulty - tremor stable  UPDATE (11/15/17, VRP): Since last visit, doing about the same. Tolerating meds. No alleviating or aggravating factors. Memory and gait continue to be issues. Planning to go to [place] next spring.   UPDATE 02/13/17: Since last visit, feels stable. Tolerating meds. Some memory issues, esp when tired.   UPDATE 08/17/16: Since last visit, continues with intermittent shaking sens on "inside". Sometimes forgets the mid day dose of carb/levo.  UPDATE 02/16/16: Since last visit, doing well. Memory loss stable. Having some pain in feet. Now has spinal cord stim for back pain issues.   UPDATE 10/13/15: Since last visit, more memory loss problems. Forgetting order when playing bridge. Forgetting recent conversations and names. Had fall in Aug 2016, with right foot fracture.   UPDATE 07/06/15: More issues with kidney stones, UTIs, shoulder pain. No wearing off. No tremor. More balance and freezing issues. Short term memory problems continue. Patient is in denial about need to use walker and her own memory problems.   UPDATE 03/02/15: Since last visit, patient's balance has worsened. She fell recently (02/26/15), with right non-displaced distal right radial fracture. Notes more tremor and wearing off around 4pm everyday. Currently on carb/levo 1.5tab TID and pramipexole 1mg  TID. Sometimes takes a 4th dose of carb/levo 1.5tabs. Had been using a walker, mainly at night time, but did not use at time of fall.  UPDATE 09/03/14: Since last visit, tremor is well controlled. Gait and freezing are stable,. Doing well on (carb/levo 1.5 tabs + pramipexole 1mg ) taken TID (sometimes QID,  with extra late evening dose if wearing off is notable). Having more foot pain, numbness, drooling prob, flushing, anxiety. Saw psychology last visit, then rec to see psychiatry, but sxs improved and the psychiatrist who was recommended to them was not taking new patients.   UPDATE 02/24/14: Since last visit, has increased carb/levo up to 1 tab 4x per day; has also incr pramipexole up to 1mg  4x per day (for convenience of remembering, even though we normally dose BID or TID). Also with more anxiety, decr confidence, more tremor in right upper ext. Also reports some right shoulder, bilateral knee, low back pain. Has noticed some freezing of gait. Has appt with psychology later today.  UPDATE 08/26/13: Since last visit, noticing some wearing off in early evening. Takes carb/levo 6:30am, noon, 9pm. Also getting more "worked up" lately, reading about PD online. No falls. Uses cane and walker. More fatigue and mental exhaustion.  UPDATE 02/04/13: Since last visit, has had some minor falls. tremor and PD sxs better on carb/levo. No anxiety. Some drooling and hypophonia. Not using walker or cane all the time. constipation stable.  UPDATE 10/06/12: Since last visit, had another back surgery (april 2013), dx'd with CHF, dx'd with sjogrens, also with fall in August 2013 with pelvic fracture. Reports good tremor control. Some freezing of gait.  UPDATE 02/13/12: Now on pramipexole 1mg  QID (7am, noon, 5pm, 10pm). Some wearing off around 5pm. Also, had rxn to [facility], now with right leg pain. Also getting lumbar radiculopathy eval per ortho.  UPDATE 08/14/11: Tremor developing around 4pm now.  More anxiety per  husband.  Also with UTI x 2.  Now dx'd with left foot fractures (? "stubbed" toes; no falls).  UPDATE 04/27/11: Tolerating pramipexole 1mg  TID (7am, 1-2pm, 7p).  some wearing off right at 7p.  feels unsteady, and has ongoing back problems and pain.  UPDATE 01/24/11: Doing slightly better with pramipexole 1mg   BID.  Tremor returns later in the day before evening dose.  Still with gait and balance diff.    PRIOR HPI: 85 year old female with history of hypertension, hypercholesterolemia, hypothyroidism, fibromyalgia, restless leg syndrome, presenting for evaluation of tremor for the past 3 years. She is here with her husband. Approximately 3 years ago patient developed intermittent tremor in her left hand particularly in her thumb and fingers.  Tremor is most noticeable at rest and in the evening.  In June 2011, patient tripped, fell and broke her left wrist and forearm.  This was repaired surgically and in a cast; during this time and since then her tremor has improved. Patient also has history of restless leg syndrome for the past 5 years. She's been on pramipexole for number of years (0.5mg  qhs) and recently increased to 1 mg qhs a few weeks ago.  The patient's mother also had restless leg syndrome. Patient has a diagnosis of iron deficiency anemia but is not on iron replacement.   Observations/Objective:  GENERAL EXAM/CONSTITUTIONAL: Vitals:  Vitals:   11/16/19 1359  BP: 125/62  Pulse: 76  Temp: (!) 97.2 F (36.2 C)     There is no height or weight on file to calculate BMI. Wt Readings from Last 3 Encounters:  11/03/19 142 lb 8 oz (64.6 kg)  11/02/19 142 lb 8 oz (64.6 kg)  10/27/19 142 lb 8 oz (64.6 kg)     Patient is in no distress; well developed, nourished and groomed; neck is supple  CARDIOVASCULAR:  Examination of carotid arteries is normal; no carotid bruits  Regular rate and rhythm, no murmurs  Examination of peripheral vascular system by observation and palpation is normal  EYES:  Ophthalmoscopic exam of optic discs and posterior segments is normal; no papilledema or hemorrhages  No exam data present  MUSCULOSKELETAL:  Gait, strength, tone, movements noted in Neurologic exam below  NEUROLOGIC: MENTAL STATUS:  MMSE - Mini Mental State Exam 07/21/2018 04/21/2018  04/08/2018  Not completed: - - (No Data)  Orientation to time 3 3 4   Orientation to Place 5 5 5   Registration 3 3 3   Attention/ Calculation 5 5 5   Recall 2 2 1   Language- name 2 objects 2 2 2   Language- repeat 1 1 1   Language- follow 3 step command 3 3 3   Language- read & follow direction 1 1 1   Write a sentence 1 1 1   Copy design 0 0 1  Total score 26 26 27     awake, alert, oriented to person, place and time  recent and remote memory intact  normal attention and concentration  language fluent, comprehension intact, naming intact  fund of knowledge appropriate  CRANIAL NERVE:   2nd - no papilledema on fundoscopic exam  2nd, 3rd, 4th, 6th - pupils equal and reactive to light, visual fields full to confrontation, extraocular muscles intact, no nystagmus  5th - facial sensation symmetric  7th - facial strength symmetric  8th - hearing intact  9th - palate elevates symmetrically, uvula midline  11th - shoulder shrug symmetric  12th - tongue protrusion midline  SOFT VOICE  MOTOR:   SLOW MOVEMENTS; NO TREMOR  NO  COGWHEELING  LIMITED STRENGTH DUE TO PAIN (BUE 3-4; BLE 2-3)  SENSORY:   normal and symmetric to light touch  COORDINATION:   finger-nose-finger, fine finger movements SLOW   REFLEXES:   deep tendon reflexes TRACE and symmetric  GAIT/STATION:   IN WHEELCHAIR   Assessment and Plan:  PARKINSON'S DISEASE (advanced) - continue pramipexole 1mg  twice a day - continue carb/levo (1 tab in AM and 2 tabs in PM) - continue seroquel for anxiety / agitation / psychosis   Follow Up Instructions:  - Return for pending if symptoms worsen or fail to improve. FOLLOW UP AS NEEDED      [name], MD [id number], [id number] PM Certified in Neurology, Neurophysiology and [facility] [address], [place], [place] [address] [phone]

## 2019-11-17 ENCOUNTER — Non-Acute Institutional Stay (SKILLED_NURSING_FACILITY): Payer: Medicare Other | Admitting: Internal Medicine

## 2019-11-17 ENCOUNTER — Encounter: Payer: Self-pay | Admitting: Internal Medicine

## 2019-11-17 DIAGNOSIS — M6281 Muscle weakness (generalized): Secondary | ICD-10-CM | POA: Diagnosis not present

## 2019-11-17 DIAGNOSIS — Z20828 Contact with and (suspected) exposure to other viral communicable diseases: Secondary | ICD-10-CM | POA: Diagnosis not present

## 2019-11-17 DIAGNOSIS — R29898 Other symptoms and signs involving the musculoskeletal system: Secondary | ICD-10-CM | POA: Diagnosis not present

## 2019-11-17 DIAGNOSIS — R441 Visual hallucinations: Secondary | ICD-10-CM | POA: Diagnosis not present

## 2019-11-17 DIAGNOSIS — K5909 Other constipation: Secondary | ICD-10-CM | POA: Diagnosis not present

## 2019-11-17 DIAGNOSIS — R278 Other lack of coordination: Secondary | ICD-10-CM | POA: Diagnosis not present

## 2019-11-17 DIAGNOSIS — G20 Parkinson's disease: Secondary | ICD-10-CM

## 2019-11-17 DIAGNOSIS — F323 Major depressive disorder, single episode, severe with psychotic features: Secondary | ICD-10-CM

## 2019-11-17 DIAGNOSIS — Z7389 Other problems related to life management difficulty: Secondary | ICD-10-CM | POA: Diagnosis not present

## 2019-11-17 NOTE — Progress Notes (Signed)
Location:   [facility] Room Number: 34 Place of Service:  SNF [phone]) Provider:  [name], MD  [name], MD  Patient Care Team: [name], MD as PCP - General (Internal Medicine) [name], MD as PCP - Cardiology (Cardiology) [name], MD (Neurosurgery) [name], MD (Neurology) [name], MD as Consulting Physician (Gastroenterology) [name], MD as Consulting Physician (Urology) [name], Man X, NP as Nurse Practitioner (Internal Medicine)  Extended Emergency Contact Information Primary Emergency Contact: [facility] Address: [address]          [place], [place] [address] [name] [place] Phone: [phone] Mobile Phone: [phone] Relation: Spouse Secondary Emergency Contact: [name] Mobile Phone: [phone] Relation: Daughter Preferred language: [name] Interpreter needed? No  Code Status:  Full Code Goals of care: Advanced Directive information Advanced Directives 09/11/2019  Does Patient Have a Medical Advance Directive? No  Type of Advance Directive -  Does patient want to make changes to medical advance directive? -  Copy of [place] in Chart? -  Would patient like information on creating a medical advance directive? -  Pre-existing out of facility DNR order (yellow form or pink MOST form) -     Chief Complaint  Patient presents with  . Acute Visit    Follow up on behavior     HPI:  Pt is a 85 y.o. female seen today for an acute visit for Her Multiple Problems Patient wanted to see me today as she continues to have many issues  Patient has history of Parkinson disease with visual hallucinations, peripheral neuropathy, hypothyroid, GERD, osteoporosis, hyperlipidemia, gout, dyspnea on exertion and Aortic stenosis Patient was living with her husband [facility] [place] [address] [address] Per her husband patient was getting dependent for her  ADLs. She was unable to get out of bed and take care of herself without his help. So she has been moved to SNF now  Parkinson Disease with behavior Issues Was seen By Neurology Recently No Changes Suggested Hallucinations Still continue specially in the evenings. Seroquel is working but some days she needs lot of help from Nurses and Extra doses of Seroquel Confusion  Continues like today she was telling me how her husband has is working so hard and is not feeling well Constipation Amitiza worked before nut no t any more Unstable gait Working with therapy. Depression Was seen by Psychologist who suggested to go up on her Antidepressant dose    Past Medical History:  Diagnosis Date  . Anemia   . Anxiety   . Aortic stenosis    moderate by echo 2019  . Arthritis   . Broken arm    right   . Chronic diastolic CHF (congestive heart failure) ([place])   . Family history of adverse reaction to anesthesia    pt. states sister vomits  . Fibromyalgia   . Fracture of thumb 09/01/2015   right  . Fracture, foot 09/01/2015   right  . GERD (gastroesophageal reflux disease)   . H/O hiatal hernia   . History of bronchitis   . History of kidney stones   . Hypercholesterolemia   . Hypertension    dr t turner  . Hypothyroidism   . Neuropathy   . Osteoporosis   . Parkinson disease ([place])   . PVD (peripheral vascular disease) ([facility])    99% stenosis of left anteiror tibial artery, mod stenosis of left distal SFA and popliteal  artery followed by [name]  . RBBB    noted on EKG 2018  . Restless leg   . Shortness of breath    with exertion - chronic   . Sjogren's disease ([place])   . SOB (shortness of breath)    chronic due to diastolic dysfunction, deconditioning, obesity  . Tremor    Past Surgical History:  Procedure Laterality Date  . ABDOMINAL HYSTERECTOMY    . BACK SURGERY     4 back surgeries,   lumbar fusion  . CARDIAC CATHETERIZATION  2006   normal  .  CYSTOSCOPY/URETEROSCOPY/HOLMIUM LASER/STENT PLACEMENT Left 01/06/2019   Procedure: CYSTOSCOPY/RETROGRADE/URETEROSCOPY/HOLMIUM LASER/BASKET RETRIEVAL/STENT PLACEMENT;  Surgeon: Ceasar Mons, MD;  Location: [facility];  Service: Urology;  Laterality: Left;  . EYE SURGERY Bilateral    cateracts  . falls     variious fall, broken wrist,and toes  . FRACTURE SURGERY Right April 2016   Wrist, Pt. fell  . kidney stone removal Left 01/20/2019  . RIGHT/LEFT HEART CATH AND CORONARY ANGIOGRAPHY N/A 02/05/2018   Procedure: RIGHT/LEFT HEART CATH AND CORONARY ANGIOGRAPHY;  Surgeon: Troy Sine, MD;  Location: [facility];  Service: Cardiovascular;  Laterality: N/A;  . SPINAL CORD STIMULATOR INSERTION N/A 09/09/2015   Procedure: LUMBAR SPINAL CORD STIMULATOR INSERTION;  Surgeon: Clydell Hakim, MD;  Location: [facility];  Service: Neurosurgery;  Laterality: N/A;  LUMBAR SPINAL CORD STIMULATOR INSERTION  . SPINE SURGERY  April 2013   Back X's 4  . TOTAL HIP ARTHROPLASTY     right  . WRIST FRACTURE SURGERY Bilateral     Allergies  Allergen Reactions  . Adrenalone   . Lactose Other (See Comments)    abd pain, lactose intolerant  . Morphine Other (See Comments)  . Sulfa Antibiotics Other (See Comments)    Headache, very sick  . Codeine Other (See Comments)    Reaction:  Headaches and nightmares   . Lactose Intolerance (Gi) Nausea And Vomiting  . Latex Rash  . Lyrica [Pregabalin] Swelling and Other (See Comments)    Reaction:  Leg swelling  . Other Other (See Comments)    Pt states that pain medications give her nightmares.    . Plaquenil [Hydroxychloroquine Sulfate] Other (See Comments)    Reaction:  GI upset   . Reglan [Metoclopramide] Other (See Comments)    Reaction:  GI upset   . Requip [Ropinirole Hcl] Other (See Comments)    Reaction:  GI upset   . Septra [Sulfamethoxazole-Trimethoprim] Nausea And Vomiting  . Shellfish Allergy Nausea And Vomiting    Allergies as of  11/17/2019      Reactions   Adrenalone    Lactose Other (See Comments)   abd pain, lactose intolerant   Morphine Other (See Comments)   Sulfa Antibiotics Other (See Comments)   Headache, very sick   Codeine Other (See Comments)   Reaction:  Headaches and nightmares    Lactose Intolerance (gi) Nausea And Vomiting   Latex Rash   Lyrica [pregabalin] Swelling, Other (See Comments)   Reaction:  Leg swelling   Other Other (See Comments)   Pt states that pain medications give her nightmares.     Plaquenil [hydroxychloroquine Sulfate] Other (See Comments)   Reaction:  GI upset    Reglan [metoclopramide] Other (See Comments)   Reaction:  GI upset    Requip [ropinirole Hcl] Other (See Comments)   Reaction:  GI upset    Septra [sulfamethoxazole-trimethoprim] Nausea And Vomiting   Shellfish Allergy  Nausea And Vomiting      Medication List       Accurate as of November 17, 2019  2:36 PM. If you have any questions, ask your nurse or doctor.        acetaminophen 500 MG tablet Commonly known as: TYLENOL Take 500 mg by mouth. Once A Day - PRN   AeroChamber MV inhaler by Other route. Use as instructed as needed   albuterol 108 (90 Base) MCG/ACT inhaler Commonly known as: VENTOLIN HFA Inhale 2 puffs into the lungs every 6 (six) hours as needed for wheezing or shortness of breath.   aspirin EC 81 MG tablet Take 81 mg by mouth at bedtime.   CALCIUM+D3 PO Take by mouth daily. 500-200; amt: 1 tablet; oral once a day   carbidopa-levodopa 25-100 MG tablet Commonly known as: SINEMET IR Take 2 tablets by mouth at bedtime.   carbidopa-levodopa 25-100 MG tablet Commonly known as: SINEMET IR Take 1 tablet by mouth. Once a day   denosumab 60 MG/ML Soln injection Commonly known as: PROLIA Inject 60 mg into the skin every 6 (six) months. Administer in upper arm, thigh, or abdomen   docusate sodium 100 MG capsule Commonly known as: COLACE Take 100 mg by mouth at bedtime.   eucerin cream  Apply 1 application topically daily.   ezetimibe 10 MG tablet Commonly known as: ZETIA TAKE 1 TABLET ONCE DAILY.   lansoprazole 30 MG disintegrating tablet Commonly known as: PREVACID SOLUTAB Take 30 mg by mouth daily at 12 noon.   levothyroxine 50 MCG tablet Commonly known as: SYNTHROID Take 50 mcg by mouth daily before breakfast.   lubiprostone 8 MCG capsule Commonly known as: AMITIZA Take 8 mcg by mouth 2 (two) times daily with a meal.   metoprolol tartrate 25 MG tablet Commonly known as: LOPRESSOR TAKE 1 TABLET ONCE DAILY.   mineral oil-hydrophilic petrolatum ointment SPF 30 Apply to lesion on lip twice daily. Twice A Day   multivitamin with minerals Tabs tablet Take 1 tablet by mouth daily.   NONFORMULARY OR COMPOUNDED ITEM Peripheral Neuropathy Cream: Bupivacaine 1%, Doxepin 3%, Gabapentin 6%, Pentoxifylline 3%, Topiramate 1% Order faxed to [facility]   nystatin powder Commonly known as: MYCOSTATIN/NYSTOP Apply topically 2 (two) times daily as needed. What changed:   how much to take  reasons to take this   pramipexole 1 MG tablet Commonly known as: MIRAPEX Take 1 mg by mouth 2 (two) times daily.   PreserVision AREDS 2 Chew Chew by mouth. Once a day   QUEtiapine 25 MG tablet Commonly known as: SEROQUEL Take 25 mg by mouth 2 (two) times daily. What changed: Another medication with the same name was removed. Continue taking this medication, and follow the directions you see here. Changed by: [name], MD   rosuvastatin 20 MG tablet Commonly known as: CRESTOR Take 1 tablet (20 mg total) by mouth daily.   senna-docusate 8.6-50 MG tablet Commonly known as: Senokot-S Take 1 tablet by mouth daily.   sertraline 25 MG tablet Commonly known as: ZOLOFT Take 50 mg by mouth daily.   Slow Fe 142 (45 Fe) MG Tbcr Generic drug: Ferrous Sulfate Take 1 tablet by mouth. Daily   Systane 0.4-0.3 % Soln Generic drug: Polyethyl Glycol-Propyl  Glycol Apply 1 drop to eye 2 (two) times daily as needed.   Vitamin D 50 MCG (2000 UT) Caps Take 2,000 Units by mouth daily.       Review of Systems  Constitutional: Positive  for activity change.  HENT: Negative.   Respiratory: Positive for shortness of breath.   Cardiovascular: Positive for leg swelling.  Gastrointestinal: Positive for constipation.  Musculoskeletal: Positive for back pain.  Skin: Negative.   Neurological: Positive for weakness.  Psychiatric/Behavioral: Positive for confusion, dysphoric mood and hallucinations.    Immunization History  Administered Date(s) Administered  . Influenza Split 09/09/2013  . Influenza, High Dose Seasonal PF 09/12/2018  . Influenza,inj,Quad PF,6+ Mos 09/11/2016  . Pneumococcal Conjugate-13 04/15/2014  . Pneumococcal Polysaccharide-23 09/28/2005  . Tdap 09/01/2015  . Zoster Recombinat (Shingrix) 06/11/2018, 09/10/2018   Pertinent  Health Maintenance Due  Topic Date Due  . INFLUENZA VACCINE  Completed  . DEXA SCAN  Completed  . PNA vac Low Risk Adult  Completed   Fall Risk  11/16/2019 04/23/2019 04/09/2019 10/29/2018 10/27/2018  Falls in the past year? 0 0 0 0 1  Comment - - - fell June 2019 -  Number falls in past yr: - 0 0 - 0  Injury with Fall? - 0 0 - 1  Comment - - - - -  Risk for fall due to : - - - - -  Risk for fall due to: Comment - - - - -   Functional Status Survey:    Vitals:   11/17/19 1426  BP: 118/60  Pulse: 78  Resp: 20  Temp: (!) 97 F (36.1 C)  SpO2: 97%  Weight: 141 lb 1.6 oz (64 kg)  Height: 5\' 2"  (1.575 m)   Body mass index is 25.81 kg/m. Physical Exam Vitals reviewed.  Constitutional:      Appearance: Normal appearance.  HENT:     Head: Normocephalic.     Nose: Nose normal.     Mouth/Throat:     Mouth: Mucous membranes are moist.     Pharynx: Oropharynx is clear.  Eyes:     Pupils: Pupils are equal, round, and reactive to light.  Cardiovascular:     Rate and Rhythm: Normal rate.      Heart sounds: Murmur present.  Pulmonary:     Effort: Pulmonary effort is normal.     Breath sounds: Normal breath sounds.  Abdominal:     General: Abdomen is flat. Bowel sounds are normal.     Palpations: Abdomen is soft.  Musculoskeletal:        General: Swelling present.     Cervical back: Neck supple.  Skin:    General: Skin is warm.  Neurological:     General: No focal deficit present.     Mental Status: She is alert.  Psychiatric:     Comments: Confused and hallucinations     Labs reviewed: Recent Labs    01/02/19 1311 05/26/19 0906 07/24/19 09/08/19  NA 138 143 142 144  K 4.7 4.1 4.3 3.5  CL 108 110 110  --   CO2 21* 24 24  --   GLUCOSE 118* 97  --   --   BUN 14 13 12 10   CREATININE 1.34* 0.65 0.7 0.7  CALCIUM 9.5 9.0 9.0  --    Recent Labs    01/02/19 1935 07/14/19 0811 07/24/19 09/08/19  AST 24 21 43* 23  ALT 13 7 5* 12  ALKPHOS 56 56 0.4* 46  BILITOT 0.8 0.3  --   --   PROT 6.4* 6.1 5.8  --   ALBUMIN 3.5 4.3 3.7  --    Recent Labs    01/02/19 1311 05/26/19 0906 09/08/19  WBC 10.8*  7.4 7.6  NEUTROABS  --  4,862  --   HGB 9.6* 9.4* 11.1*  HCT 31.4* 29.8* 34*  MCV 92.1 89.5  --   PLT 288 302 245   Lab Results  Component Value Date   TSH 1.62 09/08/2019   No results found for: HGBA1C Lab Results  Component Value Date   CHOL 118 07/14/2019   HDL 50 07/14/2019   LDLCALC 32 07/14/2019   TRIG 182 (H) 07/14/2019   CHOLHDL 2.4 07/14/2019    Significant Diagnostic Results in last 30 days:  No results found.  Assessment/Plan  Parkinson's disease ([place]) Continue on Mirapex and Sinemet Per Neurology  Unstable gait Wheelchair Dependent Working with therapy Iron deficiency anemia  On Low dose of iron and Hgb is stable  Hallucinations, visual Improved on Seroquel but still gets confused especially in the evenings with Anxiety and trying to leave the facility Does respond to extra PRN Seroquel  Hypothyroidism, unspecified type TSH is  Normal in 9/20  Depression, unspecified depression type On Zoloft with PRN Seroquel Will wait for Zoloft to increase right now  Sjogren's syndrome, with unspecified organ involvement ([place]) Has been stable Not seen any active symptoms Was on Plaquenil before  D/W Her husband will wait for Rheumatology Referal  Chronic constipation Increased her Amitiza to 16 mcg BID Osteoporosis Scheduled for Prolia Shot    Family/ staff Communication:   Labs/tests ordered:   Total time spent in this patient care encounter was  25_  minutes; greater than 50% of the visit spent counseling patient and staff, reviewing records , Labs and coordinating care for problems addressed at this encounter.

## 2019-11-18 DIAGNOSIS — M6281 Muscle weakness (generalized): Secondary | ICD-10-CM | POA: Diagnosis not present

## 2019-11-18 DIAGNOSIS — M810 Age-related osteoporosis without current pathological fracture: Secondary | ICD-10-CM | POA: Diagnosis not present

## 2019-11-18 DIAGNOSIS — G20 Parkinson's disease: Secondary | ICD-10-CM | POA: Diagnosis not present

## 2019-11-18 DIAGNOSIS — Z20828 Contact with and (suspected) exposure to other viral communicable diseases: Secondary | ICD-10-CM | POA: Diagnosis not present

## 2019-11-18 DIAGNOSIS — R29898 Other symptoms and signs involving the musculoskeletal system: Secondary | ICD-10-CM | POA: Diagnosis not present

## 2019-11-18 DIAGNOSIS — R278 Other lack of coordination: Secondary | ICD-10-CM | POA: Diagnosis not present

## 2019-11-18 DIAGNOSIS — Z7389 Other problems related to life management difficulty: Secondary | ICD-10-CM | POA: Diagnosis not present

## 2019-11-19 DIAGNOSIS — Z7389 Other problems related to life management difficulty: Secondary | ICD-10-CM | POA: Diagnosis not present

## 2019-11-19 DIAGNOSIS — R29898 Other symptoms and signs involving the musculoskeletal system: Secondary | ICD-10-CM | POA: Diagnosis not present

## 2019-11-19 DIAGNOSIS — Z20828 Contact with and (suspected) exposure to other viral communicable diseases: Secondary | ICD-10-CM | POA: Diagnosis not present

## 2019-11-19 DIAGNOSIS — M6281 Muscle weakness (generalized): Secondary | ICD-10-CM | POA: Diagnosis not present

## 2019-11-19 DIAGNOSIS — G20 Parkinson's disease: Secondary | ICD-10-CM | POA: Diagnosis not present

## 2019-11-19 DIAGNOSIS — R278 Other lack of coordination: Secondary | ICD-10-CM | POA: Diagnosis not present

## 2019-11-23 ENCOUNTER — Encounter: Payer: Self-pay | Admitting: Nurse Practitioner

## 2019-11-23 ENCOUNTER — Non-Acute Institutional Stay (SKILLED_NURSING_FACILITY): Payer: Medicare Other | Admitting: Nurse Practitioner

## 2019-11-23 DIAGNOSIS — M5440 Lumbago with sciatica, unspecified side: Secondary | ICD-10-CM

## 2019-11-23 DIAGNOSIS — G20 Parkinson's disease: Secondary | ICD-10-CM | POA: Diagnosis not present

## 2019-11-23 DIAGNOSIS — I10 Essential (primary) hypertension: Secondary | ICD-10-CM | POA: Diagnosis not present

## 2019-11-23 DIAGNOSIS — G8929 Other chronic pain: Secondary | ICD-10-CM

## 2019-11-23 DIAGNOSIS — F323 Major depressive disorder, single episode, severe with psychotic features: Secondary | ICD-10-CM | POA: Diagnosis not present

## 2019-11-23 DIAGNOSIS — W19XXXA Unspecified fall, initial encounter: Secondary | ICD-10-CM

## 2019-11-23 NOTE — Progress Notes (Signed)
Location:   [place] Room Number: 73 Place of Service:  SNF (31) Provider:  Kamron Vanwyhe NP  [name], MD  Patient Care Team: [name], MD as PCP - General (Internal Medicine) [name], MD as PCP - Cardiology (Cardiology) [name], MD (Neurosurgery) [name], MD (Neurology) [name], MD as Consulting Physician (Gastroenterology) [name], MD as Consulting Physician (Urology) Khloi Rawl X, NP as Nurse Practitioner (Internal Medicine)  Extended Emergency Contact Information Primary Emergency Contact: [facility] Address: [address] [place]          [place], [place] [address] [name] of [place] Phone: [phone] Mobile Phone: [phone] Relation: Spouse Secondary Emergency Contact: [name] Mobile Phone: [phone] Relation: Daughter Preferred language: [name] Interpreter needed? No  Code Status:  Full Code Goals of care: Advanced Directive information Advanced Directives 09/11/2019  Does Patient Have a Medical Advance Directive? No  Type of Advance Directive -  Does patient want to make changes to medical advance directive? -  Copy of [place] in Chart? -  Would patient like information on creating a medical advance directive? -  Pre-existing out of facility DNR order (yellow form or pink MOST form) -     Chief Complaint  Patient presents with  . Acute Visit    Fall    HPI:  Pt is a 85 y.o. female seen today for an acute visit for reported the fall 11/22/19 when the patient was found on floor sitting on her buttocks with her back towards the bed. Hx of multiple site of osteoarthritis, the patient stated ache/pains varies in different site, prn Tylenol 500mg  qd available to her.  The patient has history of Parkinson's disease, taking Mirapex 1mg  bid, Sinemet 25/100mg  I qd, II qhs. HTN, blood pressure is controlled, on Metoprolol 25mg  qd. Her mood is  managed with Seroquel 25mg  bid, Sertraline 50mg  qd.    Past Medical History:  Diagnosis Date  . Anemia   . Anxiety   . Aortic stenosis    moderate by echo 2019  . Arthritis   . Broken arm    right   . Chronic diastolic CHF (congestive heart failure) ([place])   . Family history of adverse reaction to anesthesia    pt. states sister vomits  . Fibromyalgia   . Fracture of thumb 09/01/2015   right  . Fracture, foot 09/01/2015   right  . GERD (gastroesophageal reflux disease)   . H/O hiatal hernia   . History of bronchitis   . History of kidney stones   . Hypercholesterolemia   . Hypertension    dr t turner  . Hypothyroidism   . Neuropathy   . Osteoporosis   . Parkinson disease ([place])   . PVD (peripheral vascular disease) ([facility])    99% stenosis of left anteiror tibial artery, mod stenosis of left distal SFA and popliteal artery followed by [name]  . RBBB    noted on EKG 2018  . Restless leg   . Shortness of breath    with exertion - chronic   . Sjogren's disease ([place])   . SOB (shortness of breath)    chronic due to diastolic dysfunction, deconditioning, obesity  . Tremor    Past Surgical History:  Procedure Laterality Date  . ABDOMINAL HYSTERECTOMY    . BACK SURGERY     4 back surgeries,   lumbar fusion  . CARDIAC CATHETERIZATION  2006   normal  .  CYSTOSCOPY/URETEROSCOPY/HOLMIUM LASER/STENT PLACEMENT Left 01/06/2019   Procedure: CYSTOSCOPY/RETROGRADE/URETEROSCOPY/HOLMIUM LASER/BASKET RETRIEVAL/STENT PLACEMENT;  Surgeon: Ceasar Mons, MD;  Location: [facility];  Service: Urology;  Laterality: Left;  . EYE SURGERY Bilateral    cateracts  . falls     variious fall, broken wrist,and toes  . FRACTURE SURGERY Right April 2016   Wrist, Pt. fell  . kidney stone removal Left 01/20/2019  . RIGHT/LEFT HEART CATH AND CORONARY ANGIOGRAPHY N/A 02/05/2018   Procedure: RIGHT/LEFT HEART CATH AND CORONARY ANGIOGRAPHY;  Surgeon: Troy Sine, MD;  Location: [facility];  Service: Cardiovascular;  Laterality: N/A;  . SPINAL CORD STIMULATOR INSERTION N/A 09/09/2015   Procedure: LUMBAR SPINAL CORD STIMULATOR INSERTION;  Surgeon: Clydell Hakim, MD;  Location: [facility];  Service: Neurosurgery;  Laterality: N/A;  LUMBAR SPINAL CORD STIMULATOR INSERTION  . SPINE SURGERY  April 2013   Back X's 4  . TOTAL HIP ARTHROPLASTY     right  . WRIST FRACTURE SURGERY Bilateral     Allergies  Allergen Reactions  . Adrenalone   . Lactose Other (See Comments)    abd pain, lactose intolerant  . Morphine Other (See Comments)  . Sulfa Antibiotics Other (See Comments)    Headache, very sick  . Codeine Other (See Comments)    Reaction:  Headaches and nightmares   . Lactose Intolerance (Gi) Nausea And Vomiting  . Latex Rash  . Lyrica [Pregabalin] Swelling and Other (See Comments)    Reaction:  Leg swelling  . Other Other (See Comments)    Pt states that pain medications give her nightmares.    . Plaquenil [Hydroxychloroquine Sulfate] Other (See Comments)    Reaction:  GI upset   . Reglan [Metoclopramide] Other (See Comments)    Reaction:  GI upset   . Requip [Ropinirole Hcl] Other (See Comments)    Reaction:  GI upset   . Septra [Sulfamethoxazole-Trimethoprim] Nausea And Vomiting  . Shellfish Allergy Nausea And Vomiting    Allergies as of 11/23/2019      Reactions   Adrenalone    Lactose Other (See Comments)   abd pain, lactose intolerant   Morphine Other (See Comments)   Sulfa Antibiotics Other (See Comments)   Headache, very sick   Codeine Other (See Comments)   Reaction:  Headaches and nightmares    Lactose Intolerance (gi) Nausea And Vomiting   Latex Rash   Lyrica [pregabalin] Swelling, Other (See Comments)   Reaction:  Leg swelling   Other Other (See Comments)   Pt states that pain medications give her nightmares.     Plaquenil [hydroxychloroquine Sulfate] Other (See Comments)   Reaction:  GI upset    Reglan [metoclopramide] Other (See  Comments)   Reaction:  GI upset    Requip [ropinirole Hcl] Other (See Comments)   Reaction:  GI upset    Septra [sulfamethoxazole-trimethoprim] Nausea And Vomiting   Shellfish Allergy Nausea And Vomiting      Medication List       Accurate as of November 23, 2019 11:59 PM. If you have any questions, ask your nurse or doctor.        acetaminophen 500 MG tablet Commonly known as: TYLENOL Take 500 mg by mouth. Once A Day - PRN   AeroChamber MV inhaler by Other route. Use as instructed as needed   albuterol 108 (90 Base) MCG/ACT inhaler Commonly known as: VENTOLIN HFA Inhale 2 puffs into the lungs every 6 (six) hours as needed for wheezing or  shortness of breath.   aspirin EC 81 MG tablet Take 81 mg by mouth at bedtime.   CALCIUM+D3 PO Take by mouth daily. 500-200; amt: 1 tablet; oral once a day   carbidopa-levodopa 25-100 MG tablet Commonly known as: SINEMET IR Take 2 tablets by mouth at bedtime.   carbidopa-levodopa 25-100 MG tablet Commonly known as: SINEMET IR Take 1 tablet by mouth. Once a day   denosumab 60 MG/ML Soln injection Commonly known as: PROLIA Inject 60 mg into the skin every 6 (six) months. Administer in upper arm, thigh, or abdomen   docusate sodium 100 MG capsule Commonly known as: COLACE Take 100 mg by mouth at bedtime.   eucerin cream Apply 1 application topically daily.   ezetimibe 10 MG tablet Commonly known as: ZETIA TAKE 1 TABLET ONCE DAILY.   lansoprazole 30 MG disintegrating tablet Commonly known as: PREVACID SOLUTAB Take 30 mg by mouth daily at 12 noon.   levothyroxine 50 MCG tablet Commonly known as: SYNTHROID Take 50 mcg by mouth daily before breakfast.   lubiprostone 8 MCG capsule Commonly known as: AMITIZA Take 16 mcg by mouth 2 (two) times daily with a meal.   metoprolol tartrate 25 MG tablet Commonly known as: LOPRESSOR TAKE 1 TABLET ONCE DAILY.   mineral oil-hydrophilic petrolatum ointment SPF 30 Apply to lesion on  lip twice daily. Twice A Day   multivitamin with minerals Tabs tablet Take 1 tablet by mouth daily.   NONFORMULARY OR COMPOUNDED ITEM Peripheral Neuropathy Cream: Bupivacaine 1%, Doxepin 3%, Gabapentin 6%, Pentoxifylline 3%, Topiramate 1% Order faxed to [facility]   nystatin powder Commonly known as: MYCOSTATIN/NYSTOP Apply topically 2 (two) times daily as needed. What changed:   how much to take  reasons to take this   pramipexole 1 MG tablet Commonly known as: MIRAPEX Take 1 mg by mouth 2 (two) times daily.   PreserVision AREDS 2 Chew Chew by mouth. Once a day   QUEtiapine 25 MG tablet Commonly known as: SEROQUEL Take 25 mg by mouth 2 (two) times daily.   rosuvastatin 20 MG tablet Commonly known as: CRESTOR Take 1 tablet (20 mg total) by mouth daily.   senna-docusate 8.6-50 MG tablet Commonly known as: Senokot-S Take 1 tablet by mouth daily.   sertraline 25 MG tablet Commonly known as: ZOLOFT Take 50 mg by mouth daily.   Slow Fe 142 (45 Fe) MG Tbcr Generic drug: Ferrous Sulfate Take 1 tablet by mouth. Daily   Systane 0.4-0.3 % Soln Generic drug: Polyethyl Glycol-Propyl Glycol Apply 1 drop to eye 2 (two) times daily as needed.   Vitamin D 50 MCG (2000 UT) Caps Take 2,000 Units by mouth daily.      ROS as provided with assistance of staff.  Review of Systems  Constitutional: Negative for activity change, appetite change, chills, diaphoresis, fatigue and fever.  HENT: Positive for hearing loss. Negative for congestion and voice change.   Respiratory: Positive for shortness of breath. Negative for cough and wheezing.        DOE  Cardiovascular: Positive for leg swelling. Negative for chest pain and palpitations.  Gastrointestinal: Negative for abdominal distention, abdominal pain, constipation, diarrhea, nausea and vomiting.  Genitourinary: Negative for difficulty urinating, dysuria and urgency.  Musculoskeletal: Positive for arthralgias, back  pain and gait problem.  Skin: Negative for color change and pallor.  Neurological: Negative for dizziness, speech difficulty, weakness and headaches.       Memory lapses.   Psychiatric/Behavioral: Positive for confusion and dysphoric  mood. Negative for agitation, behavioral problems, hallucinations and sleep disturbance. The patient is not nervous/anxious.     Immunization History  Administered Date(s) Administered  . Influenza Split 09/09/2013  . Influenza, High Dose Seasonal PF 09/12/2018  . Influenza,inj,Quad PF,6+ Mos 09/11/2016  . Pneumococcal Conjugate-13 04/15/2014  . Pneumococcal Polysaccharide-23 09/28/2005  . Tdap 09/01/2015  . Zoster Recombinat (Shingrix) 06/11/2018, 09/10/2018   Pertinent  Health Maintenance Due  Topic Date Due  . INFLUENZA VACCINE  Completed  . DEXA SCAN  Completed  . PNA vac Low Risk Adult  Completed   Fall Risk  11/16/2019 04/23/2019 04/09/2019 10/29/2018 10/27/2018  Falls in the past year? 0 0 0 0 1  Comment - - - fell June 2019 -  Number falls in past yr: - 0 0 - 0  Injury with Fall? - 0 0 - 1  Comment - - - - -  Risk for fall due to : - - - - -  Risk for fall due to: Comment - - - - -   Functional Status Survey:    Vitals:   11/23/19 1559  BP: 110/76  Pulse: 80  Resp: 16  Temp: (!) 96.9 F (36.1 C)  SpO2: 98%  Weight: 141 lb 1.6 oz (64 kg)  Height: 5\' 2"  (1.575 m)   Body mass index is 25.81 kg/m. Physical Exam Vitals and nursing note reviewed.  Constitutional:      General: She is not in acute distress.    Appearance: Normal appearance. She is not ill-appearing, toxic-appearing or diaphoretic.  HENT:     Head: Normocephalic and atraumatic.     Nose: Nose normal.     Mouth/Throat:     Mouth: Mucous membranes are moist.  Eyes:     Extraocular Movements: Extraocular movements intact.     Conjunctiva/sclera: Conjunctivae normal.     Pupils: Pupils are equal, round, and reactive to light.  Cardiovascular:     Rate and Rhythm:  Normal rate and regular rhythm.     Heart sounds: Murmur present.  Pulmonary:     Breath sounds: No wheezing, rhonchi or rales.  Abdominal:     General: Bowel sounds are normal. There is no distension.     Palpations: Abdomen is soft.     Tenderness: There is no abdominal tenderness. There is no right CVA tenderness, left CVA tenderness, guarding or rebound.  Musculoskeletal:     Cervical back: Normal range of motion and neck supple.     Right lower leg: Edema present.     Left lower leg: Edema present.     Comments: Trace edema BLE  Skin:    General: Skin is warm and dry.  Neurological:     General: No focal deficit present.     Mental Status: She is alert. Mental status is at baseline.     Motor: No weakness.     Coordination: Coordination normal.     Gait: Gait abnormal.     Comments: Oriented to person, place.   Psychiatric:        Mood and Affect: Mood normal.        Behavior: Behavior normal.     Labs reviewed: Recent Labs    01/02/19 1311 05/26/19 0906 07/24/19 0000 09/08/19 0000  NA 138 143 142 144  K 4.7 4.1 4.3 3.5  CL 108 110 110  --   CO2 21* 24 24  --   GLUCOSE 118* 97  --   --   BUN  14 13 12 10   CREATININE 1.34* 0.65 0.7 0.7  CALCIUM 9.5 9.0 9.0  --    Recent Labs    01/02/19 1935 07/14/19 0811 07/24/19 0000 09/08/19 0000  AST 24 21 43* 23  ALT 13 7 5* 12  ALKPHOS 56 56 0.4* 46  BILITOT 0.8 0.3  --   --   PROT 6.4* 6.1 5.8  --   ALBUMIN 3.5 4.3 3.7  --    Recent Labs    01/02/19 1311 05/26/19 0906 09/08/19 0000  WBC 10.8* 7.4 7.6  NEUTROABS  --  4,862  --   HGB 9.6* 9.4* 11.1*  HCT 31.4* 29.8* 34*  MCV 92.1 89.5  --   PLT 288 302 245   Lab Results  Component Value Date   TSH 1.62 09/08/2019   No results found for: HGBA1C Lab Results  Component Value Date   CHOL 118 07/14/2019   HDL 50 07/14/2019   LDLCALC 32 07/14/2019   TRIG 182 (H) 07/14/2019   CHOLHDL 2.4 07/14/2019    Significant Diagnostic Results in last 30 days:    No results found.  Assessment/Plan Fall Recurrent, lack of safety awareness, gait disorder are contributory, close supervision for safety, care assistance, w/c for mobility.   Depression, psychotic ([place]) Her mood is stabilizing, continue Sertraline, Seroquel.   Parkinson's disease Continue Sinemet, Mirapex. W/c for mobility.   Hypertension Blood pressure is controlled, continue Metoprolol.   Chronic back pain Mostly in lower back, but the patient stated aches/pains varies at different site, will schedule Tylenol 500mg  bid. Observe.     Family/ staff Communication: plan of care reviewed with the patient and charge nurse.   Labs/tests ordered:  none  Time spend 25 minutes.

## 2019-11-24 DIAGNOSIS — Z20828 Contact with and (suspected) exposure to other viral communicable diseases: Secondary | ICD-10-CM | POA: Diagnosis not present

## 2019-11-24 DIAGNOSIS — R29898 Other symptoms and signs involving the musculoskeletal system: Secondary | ICD-10-CM | POA: Diagnosis not present

## 2019-11-24 DIAGNOSIS — R278 Other lack of coordination: Secondary | ICD-10-CM | POA: Diagnosis not present

## 2019-11-24 DIAGNOSIS — G20 Parkinson's disease: Secondary | ICD-10-CM | POA: Diagnosis not present

## 2019-11-24 DIAGNOSIS — Z7389 Other problems related to life management difficulty: Secondary | ICD-10-CM | POA: Diagnosis not present

## 2019-11-24 DIAGNOSIS — M6281 Muscle weakness (generalized): Secondary | ICD-10-CM | POA: Diagnosis not present

## 2019-11-25 ENCOUNTER — Encounter: Payer: Self-pay | Admitting: Nurse Practitioner

## 2019-11-25 DIAGNOSIS — Z7389 Other problems related to life management difficulty: Secondary | ICD-10-CM | POA: Diagnosis not present

## 2019-11-25 DIAGNOSIS — Z20828 Contact with and (suspected) exposure to other viral communicable diseases: Secondary | ICD-10-CM | POA: Diagnosis not present

## 2019-11-25 DIAGNOSIS — M6281 Muscle weakness (generalized): Secondary | ICD-10-CM | POA: Diagnosis not present

## 2019-11-25 DIAGNOSIS — R29898 Other symptoms and signs involving the musculoskeletal system: Secondary | ICD-10-CM | POA: Diagnosis not present

## 2019-11-25 DIAGNOSIS — R278 Other lack of coordination: Secondary | ICD-10-CM | POA: Diagnosis not present

## 2019-11-25 DIAGNOSIS — G20 Parkinson's disease: Secondary | ICD-10-CM | POA: Diagnosis not present

## 2019-11-25 NOTE — Assessment & Plan Note (Signed)
Blood pressure is controlled, continue Metoprolol. 

## 2019-11-25 NOTE — Assessment & Plan Note (Signed)
Recurrent, lack of safety awareness, gait disorder are contributory, close supervision for safety, care assistance, w/c for mobility.

## 2019-11-25 NOTE — Assessment & Plan Note (Signed)
Her mood is stabilizing, continue Sertraline, Seroquel.

## 2019-11-25 NOTE — Assessment & Plan Note (Signed)
Mostly in lower back, but the patient stated aches/pains varies at different site, will schedule Tylenol 500mg  bid. Observe.

## 2019-11-25 NOTE — Assessment & Plan Note (Signed)
Continue Sinemet, Mirapex. W/c for mobility.

## 2019-11-26 LAB — HM DEXA SCAN

## 2019-11-26 LAB — HM MAMMOGRAPHY

## 2019-11-27 DIAGNOSIS — G20 Parkinson's disease: Secondary | ICD-10-CM | POA: Diagnosis not present

## 2019-11-27 DIAGNOSIS — M6281 Muscle weakness (generalized): Secondary | ICD-10-CM | POA: Diagnosis not present

## 2019-11-27 DIAGNOSIS — R29898 Other symptoms and signs involving the musculoskeletal system: Secondary | ICD-10-CM | POA: Diagnosis not present

## 2019-11-27 DIAGNOSIS — Z7389 Other problems related to life management difficulty: Secondary | ICD-10-CM | POA: Diagnosis not present

## 2019-11-27 DIAGNOSIS — Z20828 Contact with and (suspected) exposure to other viral communicable diseases: Secondary | ICD-10-CM | POA: Diagnosis not present

## 2019-11-27 DIAGNOSIS — R278 Other lack of coordination: Secondary | ICD-10-CM | POA: Diagnosis not present

## 2019-11-30 DIAGNOSIS — Z20828 Contact with and (suspected) exposure to other viral communicable diseases: Secondary | ICD-10-CM | POA: Diagnosis not present

## 2019-11-30 DIAGNOSIS — G20 Parkinson's disease: Secondary | ICD-10-CM | POA: Diagnosis not present

## 2019-11-30 DIAGNOSIS — R29898 Other symptoms and signs involving the musculoskeletal system: Secondary | ICD-10-CM | POA: Diagnosis not present

## 2019-11-30 DIAGNOSIS — Z7389 Other problems related to life management difficulty: Secondary | ICD-10-CM | POA: Diagnosis not present

## 2019-11-30 DIAGNOSIS — R278 Other lack of coordination: Secondary | ICD-10-CM | POA: Diagnosis not present

## 2019-11-30 DIAGNOSIS — M6281 Muscle weakness (generalized): Secondary | ICD-10-CM | POA: Diagnosis not present

## 2019-12-01 DIAGNOSIS — Z7389 Other problems related to life management difficulty: Secondary | ICD-10-CM | POA: Diagnosis not present

## 2019-12-01 DIAGNOSIS — R278 Other lack of coordination: Secondary | ICD-10-CM | POA: Diagnosis not present

## 2019-12-01 DIAGNOSIS — Z20828 Contact with and (suspected) exposure to other viral communicable diseases: Secondary | ICD-10-CM | POA: Diagnosis not present

## 2019-12-01 DIAGNOSIS — R29898 Other symptoms and signs involving the musculoskeletal system: Secondary | ICD-10-CM | POA: Diagnosis not present

## 2019-12-01 DIAGNOSIS — M6281 Muscle weakness (generalized): Secondary | ICD-10-CM | POA: Diagnosis not present

## 2019-12-01 DIAGNOSIS — G20 Parkinson's disease: Secondary | ICD-10-CM | POA: Diagnosis not present

## 2019-12-02 ENCOUNTER — Telehealth: Payer: Self-pay

## 2019-12-02 NOTE — Telephone Encounter (Signed)
Swati with [facility] [phone] called about a prior authorization for Amitiza for this patient. I don't see any information in Epic in regards to this. She asked me several questions to see if patient would qualify - type of constipation, documented length of time addressed, documentation of failure of lactulose or other medication, and I was not able to document - at least not without going through multiple visits.  She stated the PA expires end of day today.

## 2019-12-03 DIAGNOSIS — G20 Parkinson's disease: Secondary | ICD-10-CM | POA: Diagnosis not present

## 2019-12-03 DIAGNOSIS — Z20828 Contact with and (suspected) exposure to other viral communicable diseases: Secondary | ICD-10-CM | POA: Diagnosis not present

## 2019-12-03 DIAGNOSIS — M6281 Muscle weakness (generalized): Secondary | ICD-10-CM | POA: Diagnosis not present

## 2019-12-03 DIAGNOSIS — Z7389 Other problems related to life management difficulty: Secondary | ICD-10-CM | POA: Diagnosis not present

## 2019-12-03 DIAGNOSIS — R29898 Other symptoms and signs involving the musculoskeletal system: Secondary | ICD-10-CM | POA: Diagnosis not present

## 2019-12-03 DIAGNOSIS — R278 Other lack of coordination: Secondary | ICD-10-CM | POA: Diagnosis not present

## 2019-12-03 NOTE — Telephone Encounter (Signed)
Received paperwork [employer] of denial of prescription for [employer]. Placing paperwork in Provider's for review

## 2019-12-07 DIAGNOSIS — Z20828 Contact with and (suspected) exposure to other viral communicable diseases: Secondary | ICD-10-CM | POA: Diagnosis not present

## 2019-12-08 ENCOUNTER — Encounter: Payer: Self-pay | Admitting: Internal Medicine

## 2019-12-08 ENCOUNTER — Non-Acute Institutional Stay (SKILLED_NURSING_FACILITY): Payer: Medicare Other | Admitting: Internal Medicine

## 2019-12-08 DIAGNOSIS — R441 Visual hallucinations: Secondary | ICD-10-CM | POA: Diagnosis not present

## 2019-12-08 DIAGNOSIS — R278 Other lack of coordination: Secondary | ICD-10-CM | POA: Diagnosis not present

## 2019-12-08 DIAGNOSIS — G20 Parkinson's disease: Secondary | ICD-10-CM

## 2019-12-08 DIAGNOSIS — K5909 Other constipation: Secondary | ICD-10-CM | POA: Diagnosis not present

## 2019-12-08 DIAGNOSIS — M6281 Muscle weakness (generalized): Secondary | ICD-10-CM | POA: Diagnosis not present

## 2019-12-08 DIAGNOSIS — Z20828 Contact with and (suspected) exposure to other viral communicable diseases: Secondary | ICD-10-CM | POA: Diagnosis not present

## 2019-12-08 DIAGNOSIS — R29898 Other symptoms and signs involving the musculoskeletal system: Secondary | ICD-10-CM | POA: Diagnosis not present

## 2019-12-08 DIAGNOSIS — R600 Localized edema: Secondary | ICD-10-CM | POA: Diagnosis not present

## 2019-12-08 DIAGNOSIS — Z7389 Other problems related to life management difficulty: Secondary | ICD-10-CM | POA: Diagnosis not present

## 2019-12-08 NOTE — Progress Notes (Signed)
Location:   [facility] Room Number: 34 Place of Service:  SNF [phone]) Provider:  [name], MD  [name], MD  Patient Care Team: [name], MD as PCP - General (Internal Medicine) [name], MD as PCP - Cardiology (Cardiology) [name], MD (Neurosurgery) [name], MD (Neurology) [name], MD as Consulting Physician (Gastroenterology) [name], MD as Consulting Physician (Urology) [name], Man X, NP as Nurse Practitioner (Internal Medicine)  Extended Emergency Contact Information Primary Emergency Contact: [facility] Address: [address]          [place], [place] [address] [name] of [place] Phone: [phone] Mobile Phone: [phone] Relation: Spouse Secondary Emergency Contact: [name] Mobile Phone: [phone] Relation: Daughter Preferred language: [name] Interpreter needed? No  Code Status:  Full Code Goals of care: Advanced Directive information Advanced Directives 09/11/2019  Does Patient Have a Medical Advance Directive? No  Type of Advance Directive -  Does patient want to make changes to medical advance directive? -  Copy of [place] in Chart? -  Would patient like information on creating a medical advance directive? -  Pre-existing out of facility DNR order (yellow form or pink MOST form) -     Chief Complaint  Patient presents with  . Acute Visit    Follow up of behavior    HPI:  Pt is a 85 y.o. female seen today for an acute visit for Behavior Issues, LE edema and d/w the Husband about Seroquel  Patient has history of Parkinson disease with visual hallucinations, peripheral neuropathy, hypothyroid, GERD, osteoporosis, hyperlipidemia, gout, dyspnea on exertionand Aortic stenosis Patient was living with her husband in [place] [facility] Per her husband patient was getting dependent for her ADLs. She was unable to get out of  bed and take care of herself without his help. So she has been moved to SNF now  Parkinson disease with behavior issues Was seen by neurology No change in her treatment Hallucination They are controlled now on Seroquel Behavior issues Patient gets agitated in the evening.  Sometimes nurses have to use extra dose of Seroquel to calm her down. Constipation She has failed other medications like MiraLAX Is doing better on Amitiza Unstable gait Continues to work with therapy Lower extremity edema with shortness of breath Seems like recently patient has had more edema in her legs.  She is also complaining of shortness of breath which is chronic for her.  No coughing or PND   Past Medical History:  Diagnosis Date  . Anemia   . Anxiety   . Aortic stenosis    moderate by echo 2019  . Arthritis   . Broken arm    right   . Chronic diastolic CHF (congestive heart failure) ([place])   . Family history of adverse reaction to anesthesia    pt. states sister vomits  . Fibromyalgia   . Fracture of thumb 09/01/2015   right  . Fracture, foot 09/01/2015   right  . GERD (gastroesophageal reflux disease)   . H/O hiatal hernia   . History of bronchitis   . History of kidney stones   . Hypercholesterolemia   . Hypertension    dr t turner  . Hypothyroidism   . Neuropathy   . Osteoporosis   . Parkinson disease ([place])   . PVD (peripheral vascular disease) ([facility])    99% stenosis of left anteiror tibial artery, mod stenosis of left distal  SFA and popliteal artery followed by [name]  . RBBB    noted on EKG 2018  . Restless leg   . Shortness of breath    with exertion - chronic   . Sjogren's disease ([place])   . SOB (shortness of breath)    chronic due to diastolic dysfunction, deconditioning, obesity  . Tremor    Past Surgical History:  Procedure Laterality Date  . ABDOMINAL HYSTERECTOMY    . BACK SURGERY     4 back surgeries,   lumbar fusion  . CARDIAC CATHETERIZATION  2006   normal  .  CYSTOSCOPY/URETEROSCOPY/HOLMIUM LASER/STENT PLACEMENT Left 01/06/2019   Procedure: CYSTOSCOPY/RETROGRADE/URETEROSCOPY/HOLMIUM LASER/BASKET RETRIEVAL/STENT PLACEMENT;  Surgeon: Ceasar Mons, MD;  Location: [facility];  Service: Urology;  Laterality: Left;  . EYE SURGERY Bilateral    cateracts  . falls     variious fall, broken wrist,and toes  . FRACTURE SURGERY Right April 2016   Wrist, Pt. fell  . kidney stone removal Left 01/20/2019  . RIGHT/LEFT HEART CATH AND CORONARY ANGIOGRAPHY N/A 02/05/2018   Procedure: RIGHT/LEFT HEART CATH AND CORONARY ANGIOGRAPHY;  Surgeon: Troy Sine, MD;  Location: [facility];  Service: Cardiovascular;  Laterality: N/A;  . SPINAL CORD STIMULATOR INSERTION N/A 09/09/2015   Procedure: LUMBAR SPINAL CORD STIMULATOR INSERTION;  Surgeon: Clydell Hakim, MD;  Location: [facility];  Service: Neurosurgery;  Laterality: N/A;  LUMBAR SPINAL CORD STIMULATOR INSERTION  . SPINE SURGERY  April 2013   Back X's 4  . TOTAL HIP ARTHROPLASTY     right  . WRIST FRACTURE SURGERY Bilateral     Allergies  Allergen Reactions  . Adrenalone   . Lactose Other (See Comments)    abd pain, lactose intolerant  . Morphine Other (See Comments)  . Sulfa Antibiotics Other (See Comments)    Headache, very sick  . Codeine Other (See Comments)    Reaction:  Headaches and nightmares   . Lactose Intolerance (Gi) Nausea And Vomiting  . Latex Rash  . Lyrica [Pregabalin] Swelling and Other (See Comments)    Reaction:  Leg swelling  . Other Other (See Comments)    Pt states that pain medications give her nightmares.    . Plaquenil [Hydroxychloroquine Sulfate] Other (See Comments)    Reaction:  GI upset   . Reglan [Metoclopramide] Other (See Comments)    Reaction:  GI upset   . Requip [Ropinirole Hcl] Other (See Comments)    Reaction:  GI upset   . Septra [Sulfamethoxazole-Trimethoprim] Nausea And Vomiting  . Shellfish Allergy Nausea And Vomiting    Allergies as of  12/08/2019      Reactions   Adrenalone    Lactose Other (See Comments)   abd pain, lactose intolerant   Morphine Other (See Comments)   Sulfa Antibiotics Other (See Comments)   Headache, very sick   Codeine Other (See Comments)   Reaction:  Headaches and nightmares    Lactose Intolerance (gi) Nausea And Vomiting   Latex Rash   Lyrica [pregabalin] Swelling, Other (See Comments)   Reaction:  Leg swelling   Other Other (See Comments)   Pt states that pain medications give her nightmares.     Plaquenil [hydroxychloroquine Sulfate] Other (See Comments)   Reaction:  GI upset    Reglan [metoclopramide] Other (See Comments)   Reaction:  GI upset    Requip [ropinirole Hcl] Other (See Comments)   Reaction:  GI upset    Septra [sulfamethoxazole-trimethoprim] Nausea And Vomiting  Shellfish Allergy Nausea And Vomiting      Medication List       Accurate as of December 08, 2019  2:13 PM. If you have any questions, ask your nurse or doctor.        acetaminophen 500 MG tablet Commonly known as: TYLENOL Take 500 mg by mouth. Once A Day - PRN   TYLENOL 500 MG tablet Generic drug: acetaminophen Take 500 mg by mouth 2 (two) times daily.   AeroChamber MV inhaler by Other route. Use as instructed as needed   albuterol 108 (90 Base) MCG/ACT inhaler Commonly known as: VENTOLIN HFA Inhale 2 puffs into the lungs every 6 (six) hours as needed for wheezing or shortness of breath.   aspirin EC 81 MG tablet Take 81 mg by mouth at bedtime.   CALCIUM+D3 PO Take by mouth daily. 500-200; amt: 1 tablet; oral once a day   carbidopa-levodopa 25-100 MG tablet Commonly known as: SINEMET IR Take 2 tablets by mouth at bedtime.   carbidopa-levodopa 25-100 MG tablet Commonly known as: SINEMET IR Take 1 tablet by mouth. Once a day   denosumab 60 MG/ML Soln injection Commonly known as: PROLIA Inject 60 mg into the skin every 6 (six) months. Administer in upper arm, thigh, or abdomen   docusate  sodium 100 MG capsule Commonly known as: COLACE Take 100 mg by mouth at bedtime.   eucerin cream Apply 1 application topically daily.   ezetimibe 10 MG tablet Commonly known as: ZETIA TAKE 1 TABLET ONCE DAILY.   lansoprazole 30 MG disintegrating tablet Commonly known as: PREVACID SOLUTAB Take 30 mg by mouth daily at 12 noon.   levothyroxine 50 MCG tablet Commonly known as: SYNTHROID Take 50 mcg by mouth daily before breakfast.   lubiprostone 8 MCG capsule Commonly known as: AMITIZA Take 16 mcg by mouth 2 (two) times daily with a meal.   metoprolol tartrate 25 MG tablet Commonly known as: LOPRESSOR TAKE 1 TABLET ONCE DAILY.   mineral oil-hydrophilic petrolatum ointment SPF 30 Apply to lesion on lip twice daily. Twice A Day   multivitamin with minerals Tabs tablet Take 1 tablet by mouth daily.   NONFORMULARY OR COMPOUNDED ITEM Peripheral Neuropathy Cream: Bupivacaine 1%, Doxepin 3%, Gabapentin 6%, Pentoxifylline 3%, Topiramate 1% Order faxed to [facility]   nystatin powder Commonly known as: MYCOSTATIN/NYSTOP Apply topically 2 (two) times daily as needed. What changed:   how much to take  reasons to take this   pramipexole 1 MG tablet Commonly known as: MIRAPEX Take 1 mg by mouth 2 (two) times daily.   PreserVision AREDS 2 Chew Chew by mouth. Once a day   QUEtiapine 25 MG tablet Commonly known as: SEROQUEL Take 25 mg by mouth 2 (two) times daily.   rosuvastatin 20 MG tablet Commonly known as: CRESTOR Take 1 tablet (20 mg total) by mouth daily.   senna-docusate 8.6-50 MG tablet Commonly known as: Senokot-S Take 1 tablet by mouth at bedtime.   sertraline 25 MG tablet Commonly known as: ZOLOFT Take 50 mg by mouth daily.   Slow Fe 142 (45 Fe) MG Tbcr Generic drug: Ferrous Sulfate Take 1 tablet by mouth. Daily   Systane 0.4-0.3 % Soln Generic drug: Polyethyl Glycol-Propyl Glycol Apply 1 drop to eye 2 (two) times daily as needed.     Vitamin D 50 MCG (2000 UT) Caps Take 2,000 Units by mouth daily.       Review of Systems  Constitutional: Negative.   HENT: Negative.  Respiratory: Positive for shortness of breath.   Cardiovascular: Positive for leg swelling.  Gastrointestinal: Positive for constipation.  Genitourinary: Negative.   Musculoskeletal: Positive for arthralgias, back pain and myalgias.  Skin: Negative.   Neurological: Positive for weakness.  Psychiatric/Behavioral: Positive for agitation, behavioral problems and confusion. The patient is nervous/anxious.     Immunization History  Administered Date(s) Administered  . Influenza Split 09/09/2013  . Influenza, High Dose Seasonal PF 09/12/2018  . Influenza,inj,Quad PF,6+ Mos 09/11/2016  . Pneumococcal Conjugate-13 04/15/2014  . Pneumococcal Polysaccharide-23 09/28/2005  . Tdap 09/01/2015  . Zoster Recombinat (Shingrix) 06/11/2018, 09/10/2018   Pertinent  Health Maintenance Due  Topic Date Due  . INFLUENZA VACCINE  Completed  . DEXA SCAN  Completed  . PNA vac Low Risk Adult  Completed   Fall Risk  11/16/2019 04/23/2019 04/09/2019 10/29/2018 10/27/2018  Falls in the past year? 0 0 0 0 1  Comment - - - fell June 2019 -  Number falls in past yr: - 0 0 - 0  Injury with Fall? - 0 0 - 1  Comment - - - - -  Risk for fall due to : - - - - -  Risk for fall due to: Comment - - - - -   Functional Status Survey:    Vitals:   12/08/19 1401  BP: 120/60  Pulse: 82  Resp: 18  Temp: 98 F (36.7 C)  SpO2: 95%  Weight: 141 lb 1.6 oz (64 kg)  Height: 5\' 2"  (1.575 m)   Body mass index is 25.81 kg/m. Physical Exam Vitals reviewed.  Constitutional:      Appearance: Normal appearance.  HENT:     Head: Normocephalic.     Nose: Nose normal.     Mouth/Throat:     Mouth: Mucous membranes are moist.     Pharynx: Oropharynx is clear.  Eyes:     Pupils: Pupils are equal, round, and reactive to light.  Cardiovascular:     Rate and Rhythm: Normal rate.      Heart sounds: Murmur present.  Pulmonary:     Effort: Pulmonary effort is normal. No respiratory distress.     Breath sounds: Normal breath sounds. No wheezing or rales.  Abdominal:     General: Abdomen is flat. Bowel sounds are normal.     Palpations: Abdomen is soft.  Musculoskeletal:        General: Swelling present.     Cervical back: Neck supple.  Skin:    General: Skin is warm.  Neurological:     General: No focal deficit present.     Mental Status: She is alert.  Psychiatric:        Mood and Affect: Mood normal.        Thought Content: Thought content normal.     Labs reviewed: Recent Labs    01/02/19 1311 05/26/19 0906 07/24/19 0000 09/08/19 0000  NA 138 143 142 144  K 4.7 4.1 4.3 3.5  CL 108 110 110  --   CO2 21* 24 24  --   GLUCOSE 118* 97  --   --   BUN 14 13 12 10   CREATININE 1.34* 0.65 0.7 0.7  CALCIUM 9.5 9.0 9.0  --    Recent Labs    01/02/19 1935 07/14/19 0811 07/24/19 0000 09/08/19 0000  AST 24 21 43* 23  ALT 13 7 5* 12  ALKPHOS 56 56 0.4* 46  BILITOT 0.8 0.3  --   --  PROT 6.4* 6.1 5.8  --   ALBUMIN 3.5 4.3 3.7  --    Recent Labs    01/02/19 1311 05/26/19 0906 09/08/19 0000  WBC 10.8* 7.4 7.6  NEUTROABS  --  4,862  --   HGB 9.6* 9.4* 11.1*  HCT 31.4* 29.8* 34*  MCV 92.1 89.5  --   PLT 288 302 245   Lab Results  Component Value Date   TSH 1.62 09/08/2019   No results found for: HGBA1C Lab Results  Component Value Date   CHOL 118 07/14/2019   HDL 50 07/14/2019   LDLCALC 32 07/14/2019   TRIG 182 (H) 07/14/2019   CHOLHDL 2.4 07/14/2019    Significant Diagnostic Results in last 30 days:  No results found.  Assessment/Plan Parkinson's disease ([place]) On Sinemet and Mirapex Follows with neurology Bilateral LE edema with SOB Has seen Pulmonlogist Before She has a history of AS Follows with Dr. Radford Pax Scheduled for repeat echo in January We will start her on Lasix 20 mg with potassium She has been on as needed Lasix  before Repeat BMP in 1 week  Unstable gait Is mostly wheelchair dependent Working with therapy Iron deficiency anemia On low-dose of iron Hemoglobin is stable  Hallucinations, visual and behavior Issues Improved on Seroquel I discussed with the husband and explained to him that Seroquel is helping.  She does have few behavioral issues in the evening.   At this time no tapering is indicated Once patient gets used to the facility we will reconsider  Hypothyroidism, unspecified type TSH is Normal in 9/20  Depression, unspecified depression type On Zoloft Will wait for Zoloft to increase right now  Sjogren's syndrome, with unspecified organ involvement ([place]) Has been stable Not seen any active symptoms Was on Plaquenil before D/W Her husband will wait for Rheumatology Referal  Chronic constipation Doing better on Amitiza to 16 mcg BID Osteoporosis Scheduled for Prolia Shot Had DEXA Scan in 12/20 Hyperlipidemia LDL 32   Family/ staff Communication:   Labs/tests ordered:   Total time spent in this patient care encounter was  _45  minutes; greater than 50% of the visit spent counseling patient and staff, reviewing records , Labs and coordinating care for problems addressed at this encounter.

## 2019-12-14 DIAGNOSIS — Z20828 Contact with and (suspected) exposure to other viral communicable diseases: Secondary | ICD-10-CM | POA: Diagnosis not present

## 2019-12-15 DIAGNOSIS — R443 Hallucinations, unspecified: Secondary | ICD-10-CM | POA: Diagnosis not present

## 2019-12-15 LAB — BASIC METABOLIC PANEL
BUN: 16 (ref 4–21)
CO2: 27 — AB (ref 13–22)
Chloride: 107 (ref 99–108)
Creatinine: 0.7 (ref 0.5–1.1)
Glucose: 92
Potassium: 3.9 (ref 3.4–5.3)
Sodium: 141 (ref 137–147)

## 2019-12-15 LAB — COMPREHENSIVE METABOLIC PANEL: Calcium: 8.6 — AB (ref 8.7–10.7)

## 2019-12-16 IMAGING — DX DG CHEST 2V
2 series · 2 of 2 positions shown · non-contrast
Comparison: No recent prior.

CLINICAL DATA: Shortness of breath.

EXAM:
CHEST - 2 VIEW

[chest pa]
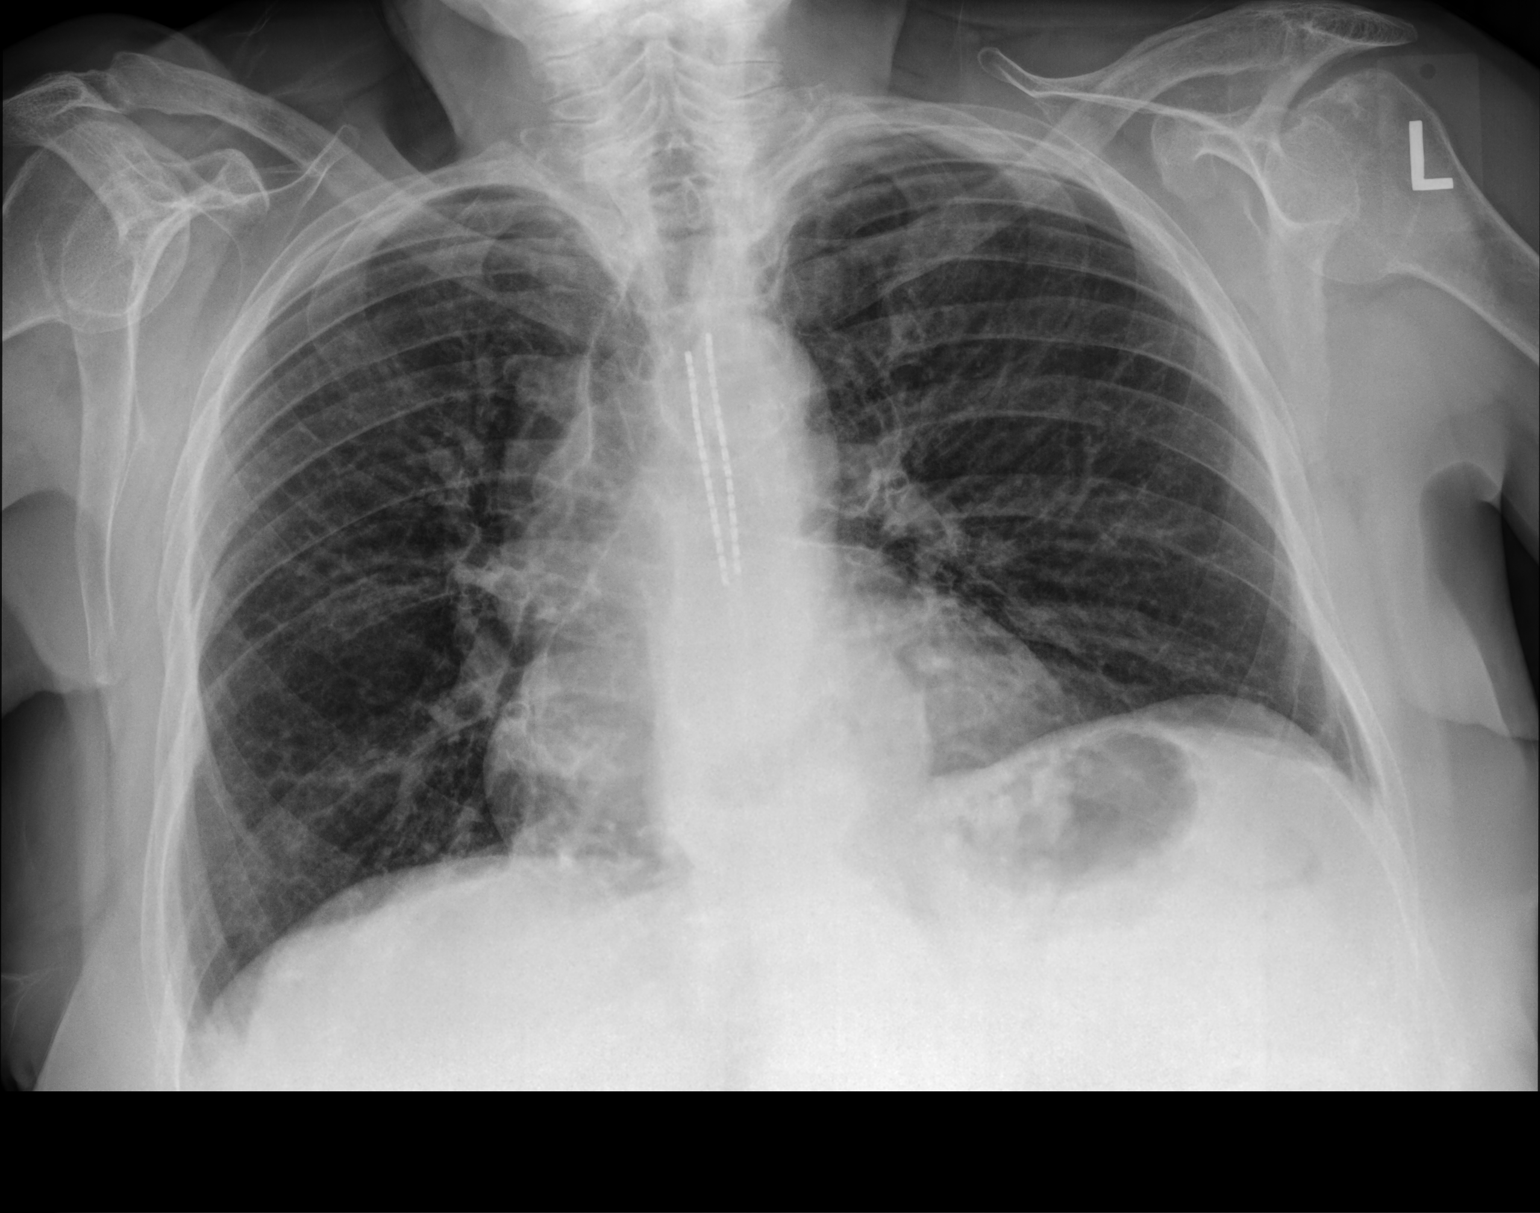

[chest lat]
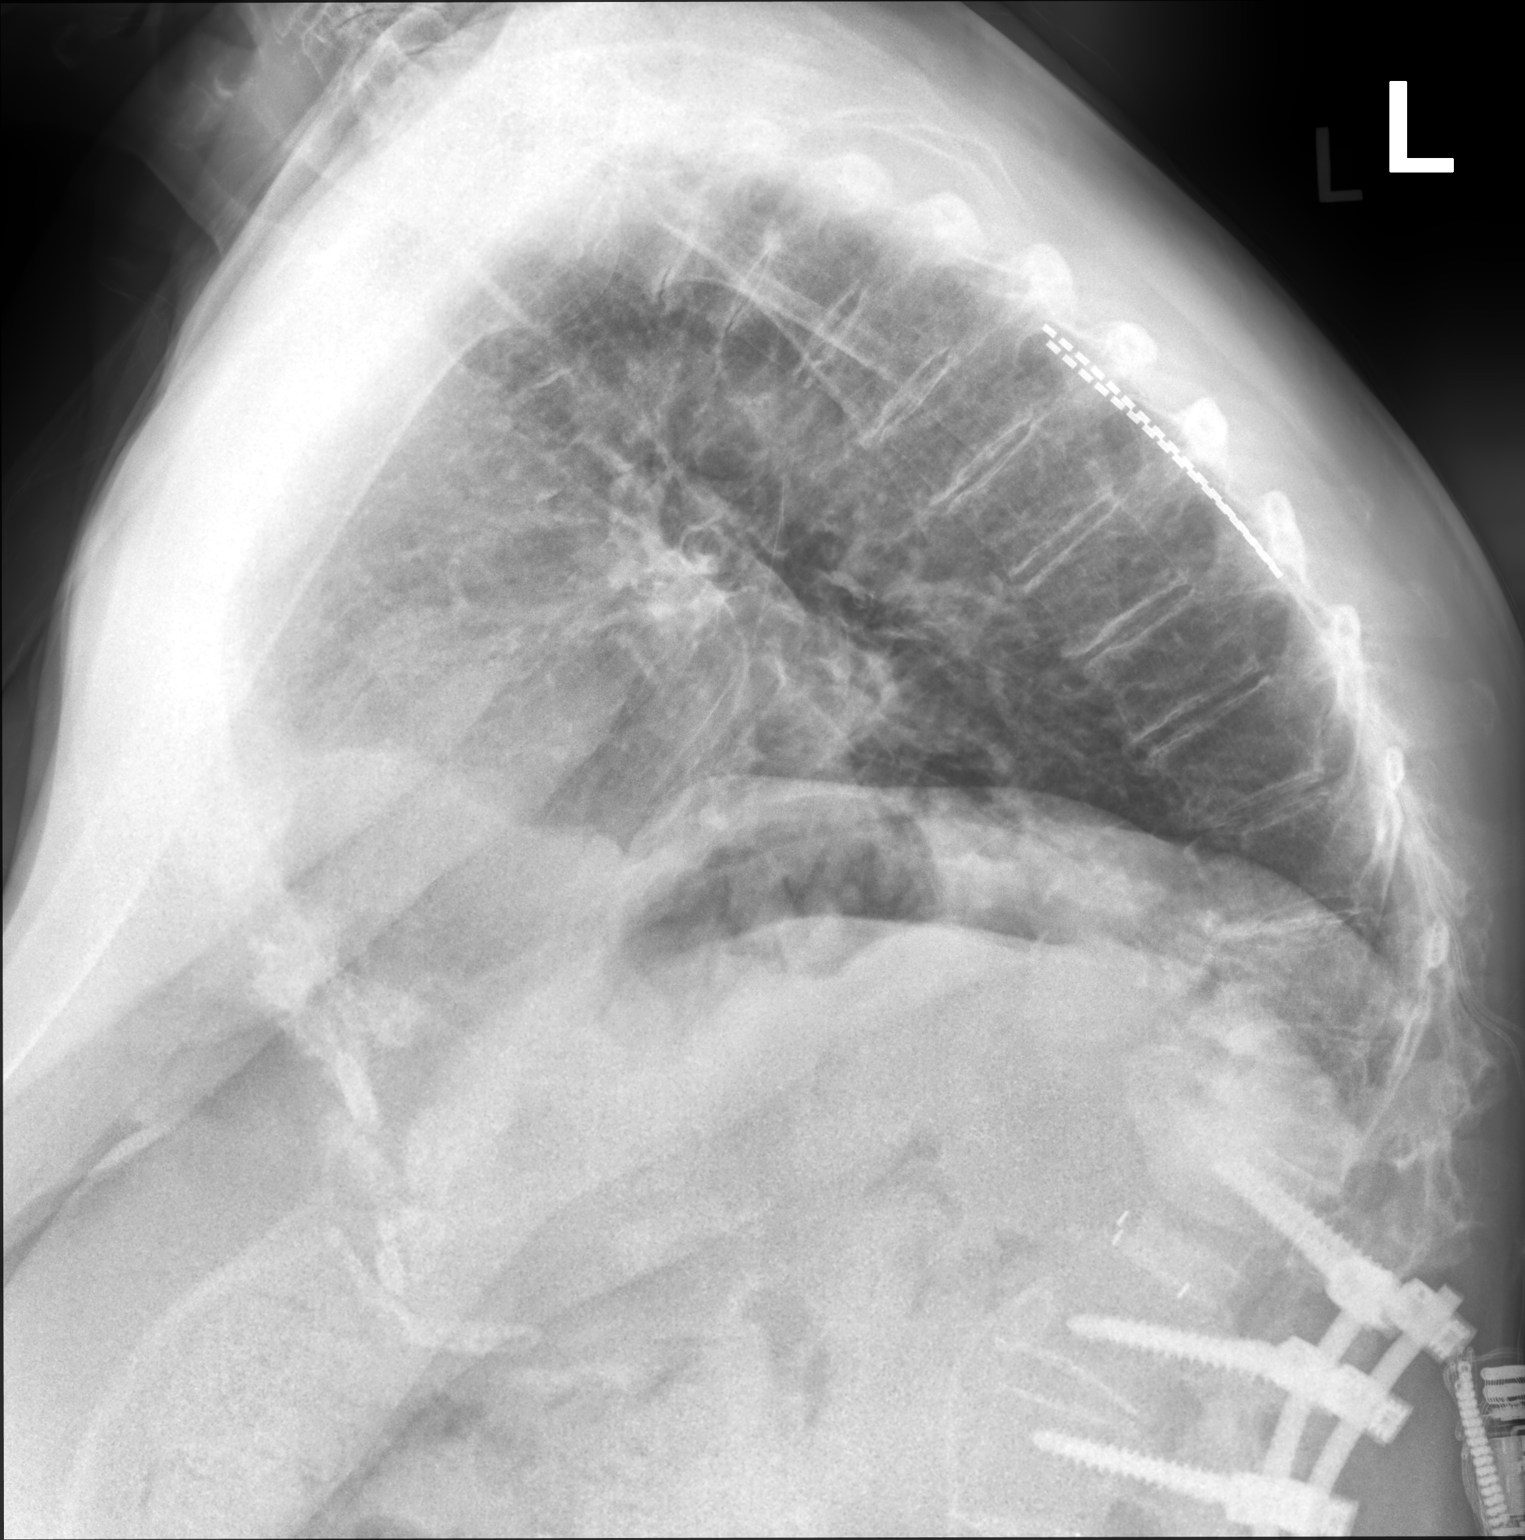

[2 of 2 positions shown; findings below may reference images not displayed]

FINDINGS: Mediastinum and hilar structures normal. Heart size normal. No
pulmonary venous congestion. Bibasilar atelectasis. Mild bibasilar
infiltrates can not be excluded. Small bilateral pleural effusions.
No pneumothorax. Elevation left hemidiaphragm. Neurostimulator noted
over the thoracic spine. Prior lumbar spine fusion. Linear density
noted anterior to the upper lumbar spinal lateral view, this could
represent a catheter, question etiology. Sliding hiatal hernia.
IMPRESSION: 1. Bibasilar atelectasis. Mild bibasilar infiltrates can not be
excluded.

2.  Sliding hiatal hernia.

## 2019-12-18 ENCOUNTER — Encounter: Payer: Self-pay | Admitting: *Deleted

## 2019-12-21 DIAGNOSIS — Z20828 Contact with and (suspected) exposure to other viral communicable diseases: Secondary | ICD-10-CM | POA: Diagnosis not present

## 2019-12-24 DIAGNOSIS — L570 Actinic keratosis: Secondary | ICD-10-CM | POA: Diagnosis not present

## 2019-12-28 DIAGNOSIS — Z20828 Contact with and (suspected) exposure to other viral communicable diseases: Secondary | ICD-10-CM | POA: Diagnosis not present

## 2019-12-30 ENCOUNTER — Other Ambulatory Visit: Payer: Self-pay

## 2019-12-30 ENCOUNTER — Telehealth: Payer: Self-pay

## 2019-12-30 ENCOUNTER — Encounter: Payer: Self-pay | Admitting: Cardiology

## 2019-12-30 ENCOUNTER — Ambulatory Visit (HOSPITAL_COMMUNITY): Payer: Medicare Other | Attending: Cardiovascular Disease

## 2019-12-30 DIAGNOSIS — I350 Nonrheumatic aortic (valve) stenosis: Secondary | ICD-10-CM | POA: Insufficient documentation

## 2019-12-30 NOTE — Telephone Encounter (Signed)
Patient's husband, Saralyn Pilar, has been informed of the results and verbalized understanding.

## 2019-12-30 NOTE — Telephone Encounter (Signed)
-----   Message from [name], MD sent at 12/30/2019  3:45 PM EST ----- Echo showed normal heart function with mildly thickened heart muscle, increased stiffness of heart muscle from HTN and aging, mildly leaky TV, moderate aortic stenosis - repeat 2D echo in 6 months to make sure AV is stable as gradients have increased some

## 2019-12-31 ENCOUNTER — Encounter: Payer: Self-pay | Admitting: Internal Medicine

## 2019-12-31 ENCOUNTER — Non-Acute Institutional Stay (SKILLED_NURSING_FACILITY): Payer: Medicare Other | Admitting: Internal Medicine

## 2019-12-31 DIAGNOSIS — F323 Major depressive disorder, single episode, severe with psychotic features: Secondary | ICD-10-CM

## 2019-12-31 DIAGNOSIS — G20 Parkinson's disease: Secondary | ICD-10-CM | POA: Diagnosis not present

## 2019-12-31 DIAGNOSIS — K5909 Other constipation: Secondary | ICD-10-CM | POA: Diagnosis not present

## 2019-12-31 DIAGNOSIS — R441 Visual hallucinations: Secondary | ICD-10-CM

## 2019-12-31 DIAGNOSIS — R600 Localized edema: Secondary | ICD-10-CM

## 2019-12-31 DIAGNOSIS — D500 Iron deficiency anemia secondary to blood loss (chronic): Secondary | ICD-10-CM | POA: Diagnosis not present

## 2019-12-31 NOTE — Progress Notes (Signed)
Location:   [facility] Room Number: 34 Place of Service:  SNF [id number]) Provider:  [name], MD  [name], MD  Patient Care Team: [name], MD as PCP - General (Internal Medicine) [name], MD as PCP - Cardiology (Cardiology) [name], MD (Neurosurgery) [name], MD (Neurology) [name], MD as Consulting Physician (Gastroenterology) [name], MD as Consulting Physician (Urology) [name], Man X, NP as Nurse Practitioner (Internal Medicine)  Extended Emergency Contact Information Primary Emergency Contact: [facility] Address: [address]          [place], [place] [address] [name] of [place] Phone: [phone] Mobile Phone: [phone] Relation: Spouse Secondary Emergency Contact: [name] Mobile Phone: [phone] Relation: Daughter Preferred language: [name] Interpreter needed? No  Code Status:  DNR Goals of care: Advanced Directive information Advanced Directives 12/31/2019  Does Patient Have a Medical Advance Directive? Yes  Type of Advance Directive Out of facility DNR (pink MOST or yellow form);Living will  Does patient want to make changes to medical advance directive? No - Patient declined  Copy of [place] in Chart? -  Would patient like information on creating a medical advance directive? -  Pre-existing out of facility DNR order (yellow form or pink MOST form) Yellow form placed in chart (order not valid for inpatient use)     Chief Complaint  Patient presents with  . Medical Management of Chronic Issues    HPI:  Pt is a 86 y.o. female seen today for medical management of chronic diseases.   Patient has history of Parkinson disease with visual hallucinations, peripheral neuropathy, hypothyroid, GERD, osteoporosis, hyperlipidemia, gout, dyspnea on exertionand Aortic stenosis Parkinson Disease with Hallucinations Better Controlled  with Seroquel Have d/w husband that we will consider taper in few Months  Neurology has not changed her Sinemet dose right now NO Falls. Not working with Therapy now. Dependent for her ADLS and Transfers Continues to have some confusion more so in Evenings LE edema with SOB Doing well and is stable on Lasix Just had Echo which showed stable AS Constipation Doing well with Amitiza ? Inflammatory Arthritis Stable right now. No Follow up with Rheumatology Weight Loss Has lost some weight initially but now has stabilized     Past Medical History:  Diagnosis Date  . Anemia   . Anxiety   . Aortic stenosis    moderate by echo 2021   . Arthritis   . Broken arm    right   . Chronic diastolic CHF (congestive heart failure) ([place])   . Family history of adverse reaction to anesthesia    pt. states sister vomits  . Fibromyalgia   . Fracture of thumb 09/01/2015   right  . Fracture, foot 09/01/2015   right  . GERD (gastroesophageal reflux disease)   . H/O hiatal hernia   . History of bronchitis   . History of kidney stones   . Hypercholesterolemia   . Hypertension    dr t turner  . Hypothyroidism   . Neuropathy   . Osteoporosis   . Parkinson disease ([place])   . PVD (peripheral vascular disease) ([facility])    99% stenosis of left anteiror tibial artery, mod stenosis of left distal SFA and popliteal artery followed by [name]  . RBBB    noted on EKG 2018  . Restless leg   . Shortness of breath    with exertion - chronic   .  Sjogren's disease ([place])   . SOB (shortness of breath)    chronic due to diastolic dysfunction, deconditioning, obesity  . Tremor    Past Surgical History:  Procedure Laterality Date  . ABDOMINAL HYSTERECTOMY    . BACK SURGERY     4 back surgeries,   lumbar fusion  . CARDIAC CATHETERIZATION  2006   normal  . CYSTOSCOPY/URETEROSCOPY/HOLMIUM LASER/STENT PLACEMENT Left 01/06/2019   Procedure: CYSTOSCOPY/RETROGRADE/URETEROSCOPY/HOLMIUM LASER/BASKET  RETRIEVAL/STENT PLACEMENT;  Surgeon: Ceasar Mons, MD;  Location: [facility];  Service: Urology;  Laterality: Left;  . EYE SURGERY Bilateral    cateracts  . falls     variious fall, broken wrist,and toes  . FRACTURE SURGERY Right April 2016   Wrist, Pt. fell  . kidney stone removal Left 01/20/2019  . RIGHT/LEFT HEART CATH AND CORONARY ANGIOGRAPHY N/A 02/05/2018   Procedure: RIGHT/LEFT HEART CATH AND CORONARY ANGIOGRAPHY;  Surgeon: Troy Sine, MD;  Location: [facility];  Service: Cardiovascular;  Laterality: N/A;  . SPINAL CORD STIMULATOR INSERTION N/A 09/09/2015   Procedure: LUMBAR SPINAL CORD STIMULATOR INSERTION;  Surgeon: Clydell Hakim, MD;  Location: [facility];  Service: Neurosurgery;  Laterality: N/A;  LUMBAR SPINAL CORD STIMULATOR INSERTION  . SPINE SURGERY  April 2013   Back X's 4  . TOTAL HIP ARTHROPLASTY     right  . WRIST FRACTURE SURGERY Bilateral     Allergies  Allergen Reactions  . Adrenalone   . Lactose Other (See Comments)    abd pain, lactose intolerant  . Morphine Other (See Comments)  . Sulfa Antibiotics Other (See Comments)    Headache, very sick  . Codeine Other (See Comments)    Reaction:  Headaches and nightmares   . Lactose Intolerance (Gi) Nausea And Vomiting  . Latex Rash  . Lyrica [Pregabalin] Swelling and Other (See Comments)    Reaction:  Leg swelling  . Other Other (See Comments)    Pt states that pain medications give her nightmares.    . Plaquenil [Hydroxychloroquine Sulfate] Other (See Comments)    Reaction:  GI upset   . Reglan [Metoclopramide] Other (See Comments)    Reaction:  GI upset   . Requip [Ropinirole Hcl] Other (See Comments)    Reaction:  GI upset   . Septra [Sulfamethoxazole-Trimethoprim] Nausea And Vomiting  . Shellfish Allergy Nausea And Vomiting    Allergies as of 12/31/2019      Reactions   Adrenalone    Lactose Other (See Comments)   abd pain, lactose intolerant   Morphine Other (See Comments)    Sulfa Antibiotics Other (See Comments)   Headache, very sick   Codeine Other (See Comments)   Reaction:  Headaches and nightmares    Lactose Intolerance (gi) Nausea And Vomiting   Latex Rash   Lyrica [pregabalin] Swelling, Other (See Comments)   Reaction:  Leg swelling   Other Other (See Comments)   Pt states that pain medications give her nightmares.     Plaquenil [hydroxychloroquine Sulfate] Other (See Comments)   Reaction:  GI upset    Reglan [metoclopramide] Other (See Comments)   Reaction:  GI upset    Requip [ropinirole Hcl] Other (See Comments)   Reaction:  GI upset    Septra [sulfamethoxazole-trimethoprim] Nausea And Vomiting   Shellfish Allergy Nausea And Vomiting      Medication List       Accurate as of December 31, 2019  9:56 AM. If you have any questions, ask your nurse or doctor.  acetaminophen 500 MG tablet Commonly known as: TYLENOL Take 500 mg by mouth. Once A Day - PRN   TYLENOL 500 MG tablet Generic drug: acetaminophen Take 500 mg by mouth 2 (two) times daily.   AeroChamber MV inhaler by Other route. Use as instructed as needed   albuterol 108 (90 Base) MCG/ACT inhaler Commonly known as: VENTOLIN HFA Inhale 2 puffs into the lungs every 6 (six) hours as needed for wheezing or shortness of breath.   aspirin EC 81 MG tablet Take 81 mg by mouth at bedtime.   CALCIUM+D3 PO Take by mouth daily. 500-200; amt: 1 tablet; oral once a day   carbidopa-levodopa 25-100 MG tablet Commonly known as: SINEMET IR Take 2 tablets by mouth at bedtime.   carbidopa-levodopa 25-100 MG tablet Commonly known as: SINEMET IR Take 1 tablet by mouth. Once a day   denosumab 60 MG/ML Soln injection Commonly known as: PROLIA Inject 60 mg into the skin every 6 (six) months. Administer in upper arm, thigh, or abdomen   docusate sodium 100 MG capsule Commonly known as: COLACE Take 100 mg by mouth at bedtime.   eucerin cream Apply 1 application topically daily.     ezetimibe 10 MG tablet Commonly known as: ZETIA TAKE 1 TABLET ONCE DAILY.   furosemide 20 MG tablet Commonly known as: LASIX Take 20 mg by mouth daily.   lansoprazole 30 MG disintegrating tablet Commonly known as: PREVACID SOLUTAB Take 30 mg by mouth daily at 12 noon.   levothyroxine 50 MCG tablet Commonly known as: SYNTHROID Take 50 mcg by mouth daily before breakfast.   lubiprostone 8 MCG capsule Commonly known as: AMITIZA Take 16 mcg by mouth 2 (two) times daily with a meal.   metoprolol tartrate 25 MG tablet Commonly known as: LOPRESSOR TAKE 1 TABLET ONCE DAILY.   mineral oil-hydrophilic petrolatum ointment SPF 30 Apply to lesion on lip twice daily. Twice A Day   multivitamin with minerals Tabs tablet Take 1 tablet by mouth daily.   NONFORMULARY OR COMPOUNDED ITEM Peripheral Neuropathy Cream: Bupivacaine 1%, Doxepin 3%, Gabapentin 6%, Pentoxifylline 3%, Topiramate 1% Order faxed to [facility]   nystatin powder Commonly known as: MYCOSTATIN/NYSTOP Apply topically 2 (two) times daily as needed. What changed:   how much to take  reasons to take this   potassium chloride 10 MEQ tablet Commonly known as: KLOR-CON Take 10 mEq by mouth daily.   pramipexole 1 MG tablet Commonly known as: MIRAPEX Take 1 mg by mouth 2 (two) times daily.   PreserVision AREDS 2 Chew Chew by mouth. Once a day   QUEtiapine 25 MG tablet Commonly known as: SEROQUEL Take 25 mg by mouth 2 (two) times daily.   rosuvastatin 20 MG tablet Commonly known as: CRESTOR Take 1 tablet (20 mg total) by mouth daily.   senna-docusate 8.6-50 MG tablet Commonly known as: Senokot-S Take 1 tablet by mouth at bedtime.   sertraline 25 MG tablet Commonly known as: ZOLOFT Take 50 mg by mouth daily.   Slow Fe 142 (45 Fe) MG Tbcr Generic drug: Ferrous Sulfate Take 1 tablet by mouth. Daily   Systane 0.4-0.3 % Soln Generic drug: Polyethyl Glycol-Propyl Glycol Apply 1 drop to eye 2  (two) times daily as needed.   Vitamin D 50 MCG (2000 UT) Caps Take 2,000 Units by mouth daily.       Review of Systems  Constitutional: Negative.   HENT: Negative.   Respiratory: Positive for shortness of breath.   Cardiovascular: Positive  for leg swelling.  Gastrointestinal: Positive for constipation.  Genitourinary: Negative.   Musculoskeletal: Negative.   Skin: Negative.   Neurological: Negative.   Psychiatric/Behavioral: Positive for behavioral problems and confusion.    Immunization History  Administered Date(s) Administered  . Influenza Split 09/09/2013  . Influenza, High Dose Seasonal PF 09/12/2018  . Influenza,inj,Quad PF,6+ Mos 09/11/2016  . Pneumococcal Conjugate-13 04/15/2014  . Pneumococcal Polysaccharide-23 09/28/2005  . Tdap 09/01/2015  . Zoster Recombinat (Shingrix) 06/11/2018, 09/10/2018   Pertinent  Health Maintenance Due  Topic Date Due  . INFLUENZA VACCINE  Completed  . DEXA SCAN  Completed  . PNA vac Low Risk Adult  Completed   Fall Risk  11/16/2019 04/23/2019 04/09/2019 10/29/2018 10/27/2018  Falls in the past year? 0 0 0 0 1  Comment - - - fell June 2019 -  Number falls in past yr: - 0 0 - 0  Injury with Fall? - 0 0 - 1  Comment - - - - -  Risk for fall due to : - - - - -  Risk for fall due to: Comment - - - - -   Functional Status Survey:    Vitals:   12/31/19 0938  BP: 140/80  Pulse: 82  Resp: 18  Temp: (!) 97.1 F (36.2 C)  SpO2: 99%  Weight: 140 lb 14.4 oz (63.9 kg)  Height: 5\' 2"  (1.575 m)   Body mass index is 25.77 kg/m. Physical Exam Vitals reviewed.  Constitutional:      Appearance: Normal appearance.  HENT:     Head: Normocephalic.     Nose: Nose normal.     Mouth/Throat:     Mouth: Mucous membranes are moist.     Pharynx: Oropharynx is clear.  Eyes:     Pupils: Pupils are equal, round, and reactive to light.  Cardiovascular:     Rate and Rhythm: Normal rate and regular rhythm.     Heart sounds: Murmur present.    Pulmonary:     Effort: Pulmonary effort is normal. No respiratory distress.     Breath sounds: Normal breath sounds. No wheezing or rales.  Abdominal:     General: Abdomen is flat. Bowel sounds are normal.     Palpations: Abdomen is soft.  Musculoskeletal:        General: Swelling present.     Cervical back: Neck supple.  Skin:    General: Skin is warm.  Neurological:     General: No focal deficit present.     Mental Status: She is alert.  Psychiatric:        Mood and Affect: Mood normal.        Behavior: Behavior normal.     Labs reviewed: Recent Labs    01/02/19 1311 01/02/19 1311 05/26/19 0906 05/26/19 0906 07/24/19 0000 09/08/19 0000 12/15/19 0000  NA 138   < > 143  --  142 144 141  K 4.7   < > 4.1   < > 4.3 3.5 3.9  CL 108   < > 110  --  110  --  107  CO2 21*   < > 24  --  24  --  27*  GLUCOSE 118*  --  97  --   --   --   --   BUN 14   < > 13  --  12 10 16   CREATININE 1.34*  --  0.65  --  0.7 0.7 0.7  CALCIUM 9.5   < > 9.0  --  9.0  --  8.6*   < > = values in this interval not displayed.   Recent Labs    01/02/19 1935 01/02/19 1935 07/14/19 0811 07/24/19 0000 09/08/19 0000  AST 24   < > 21 43* 23  ALT 13   < > 7 5* 12  ALKPHOS 56   < > 56 0.4* 46  BILITOT 0.8  --  0.3  --   --   PROT 6.4*  --  6.1 5.8  --   ALBUMIN 3.5  --  4.3 3.7  --    < > = values in this interval not displayed.   Recent Labs    01/02/19 1311 05/26/19 0906 09/08/19 0000  WBC 10.8* 7.4 7.6  NEUTROABS  --  4,862  --   HGB 9.6* 9.4* 11.1*  HCT 31.4* 29.8* 34*  MCV 92.1 89.5  --   PLT 288 302 245   Lab Results  Component Value Date   TSH 1.62 09/08/2019   No results found for: HGBA1C Lab Results  Component Value Date   CHOL 118 07/14/2019   HDL 50 07/14/2019   LDLCALC 32 07/14/2019   TRIG 182 (H) 07/14/2019   CHOLHDL 2.4 07/14/2019    Significant Diagnostic Results in last 30 days:  ECHOCARDIOGRAM COMPLETE  Result Date: 12/30/2019   ECHOCARDIOGRAM REPORT    Patient Name:   [name] Date of Exam: 12/30/2019 Medical Rec #:  [id number]         Height:       62.0 in Accession #:    [id number]        Weight:       141.1 lb Date of Birth:  February 24, 1931        BSA:          1.65 m Patient Age:    55 years          BP:           130/75 mmHg Patient Gender: F                 HR:           65 bpm. Exam Location:  [facility] Procedure: 2D Echo, Color Doppler and Cardiac Doppler Indications:    I35.0 Aortic valve disease  History:        Patient has prior history of Echocardiogram examinations, most                 recent 12/11/2018. PVD, Aortic Valve Disease, Arrythmias:RBBB;                 Risk Factors:Hypertension and Dyslipidemia. Sjogren's syndrome.  Sonographer:    Lenard Galloway BA, RDCS Referring Phys: [place]  1. Left ventricular ejection fraction, by visual estimation, is 55 to 60%. The left ventricle has normal function. Left ventricular septal wall thickness was mildly increased. There is mildly increased left ventricular hypertrophy.  2. Left ventricular diastolic parameters are consistent with Grade II diastolic dysfunction (pseudonormalization).  3. The left ventricle has no regional wall motion abnormalities.  4. Global right ventricle has normal systolic function.The right ventricular size is normal. No increase in right ventricular wall thickness.  5. Left atrial size was normal.  6. Right atrial size was normal.  7. Mild mitral annular calcification.  8. Moderate thickening of the mitral valve leaflet(s).  9. The mitral valve is normal in structure. No evidence of mitral valve regurgitation. 10. The tricuspid  valve is normal in structure. 11. The tricuspid valve is normal in structure. Tricuspid valve regurgitation is mild. 12. The aortic valve is tricuspid. Aortic valve regurgitation is not visualized. Moderate aortic valve stenosis. 13. Mean gradient has increased from 22-24mmgH and peak from 43-52 mmHg and DVI decreased from  0.34 to 0.29 since echo 12/11/18. 14. The pulmonic valve was grossly normal. Pulmonic valve regurgitation is trivial. 15. The aortic root was not well visualized. FINDINGS  Left Ventricle: Left ventricular ejection fraction, by visual estimation, is 55 to 60%. The left ventricle has normal function. The left ventricle has no regional wall motion abnormalities. The left ventricular internal cavity size was the left ventricle is normal in size. There is mildly increased left ventricular hypertrophy. Left ventricular diastolic parameters are consistent with Grade II diastolic dysfunction (pseudonormalization). Right Ventricle: The right ventricular size is normal. No increase in right ventricular wall thickness. Global RV systolic function is has normal systolic function. Left Atrium: Left atrial size was normal in size. Right Atrium: Right atrial size was normal in size Pericardium: There is no evidence of pericardial effusion. Mitral Valve: The mitral valve is normal in structure. There is moderate thickening of the mitral valve leaflet(s). There is mild calcification of the mitral valve leaflet(s). Mild mitral annular calcification. No evidence of mitral valve regurgitation. Tricuspid Valve: The tricuspid valve is normal in structure. Tricuspid valve regurgitation is mild. Aortic Valve: The aortic valve is tricuspid. Aortic valve regurgitation is not visualized. Moderate aortic stenosis is present. Aortic valve mean gradient measures 26.0 mmHg. Aortic valve peak gradient measures 51.8 mmHg. Aortic valve area, by VTI measures 0.82 cm. Mean gradient has increased from 22-54mmgH and peak from 43-52 mmHg and DVI decreased from 0.34 to 0.29 since echo 12/11/18. Pulmonic Valve: The pulmonic valve was grossly normal. Pulmonic valve regurgitation is trivial. Pulmonic regurgitation is trivial. Aorta: The aortic root was not well visualized. IAS/Shunts: No atrial level shunt detected by color flow Doppler.  LEFT VENTRICLE PLAX  2D LVIDd:         3.10 cm  Diastology LVIDs:         2.00 cm  LV e' lateral:   4.57 cm/s LV PW:         1.10 cm  LV E/e' lateral: 17.9 LV IVS:        1.30 cm  LV e' medial:    4.68 cm/s LVOT diam:     1.90 cm  LV E/e' medial:  17.5 LV SV:         25 ml LV SV Index:   14.92 LVOT Area:     2.84 cm  RIGHT VENTRICLE RV Basal diam:  2.50 cm RV Mid diam:    2.20 cm RV S prime:     9.68 cm/s TAPSE (M-mode): 2.0 cm LEFT ATRIUM             Index LA diam:        3.40 cm 2.06 cm/m LA Vol (A2C):   60.0 ml 36.40 ml/m LA Vol (A4C):   27.7 ml 16.81 ml/m LA Biplane Vol: 40.3 ml 24.45 ml/m  AORTIC VALVE AV Area (Vmax):    0.83 cm AV Area (Vmean):   0.88 cm AV Area (VTI):     0.82 cm AV Vmax:           360.00 cm/s AV Vmean:          236.000 cm/s AV VTI:  0.737 m AV Peak Grad:      51.8 mmHg AV Mean Grad:      26.0 mmHg LVOT Vmax:         105.00 cm/s LVOT Vmean:        73.300 cm/s LVOT VTI:          0.213 m LVOT/AV VTI ratio: 0.29  AORTA Ao Root diam: 2.80 cm MITRAL VALVE MV Area (PHT):                       SHUNTS MV PHT:                              Systemic VTI:  0.21 m MV Decel Time: 417 msec              Systemic Diam: 1.90 cm MV E velocity: 81.70 cm/s  103 cm/s MV A velocity: 110.00 cm/s 70.3 cm/s MV E/A ratio:  0.74        1.5  Jenkins Rouge MD Electronically signed by Jenkins Rouge MD Signature Date/Time: 12/30/2019/2:15:45 PM    Final     Assessment/Plan  Parkinson's disease ([place]) On Sinemet and Mirapex Per neurology PRN follow up Symptoms seemed controlled Bilateral LE edema with SOB Has seen Pulmonlogist Before She has a history of AS Follows with Dr. Radford Pax AS was stable on Echo 12/30/2019 Edema Much improved on Lasix BUN and Creat are stable  Unstable gait Continues to be dependent for her ADLS. No Falls  Iron deficiency anemia On low-dose of iron Hemoglobin stays stable  Hallucinations, visual and behavior Issues Improved on Seroquel I have discussed with the husband and  explained to him that Seroquel is helping.  She does have few behavioral issues in the evening.   At this time no tapering is indicated Once patient gets used to the facility we will reconsider  Hypothyroidism, unspecified type TSH is Normal in 9/20  Depression, unspecified depression type On Zoloft Will wait for Zoloft to increase right now  Sjogren's syndrome, with unspecified organ involvement ([place]) Has been stable Not seen any active symptoms Was on Plaquenil before D/W Her husband will wait for Rheumatology Referal  Chronic constipation Doing better on Amitiza to 16 mcg BID Osteoporosis Up to date  on [place] it out of facility Hyperlipidemia LDL 32      Family/ staff Communication:   Labs/tests ordered:    Total time spent in this patient care encounter was  _25  minutes; greater than 50% of the visit spent counseling patient and staff, reviewing records , Labs and coordinating care for problems addressed at this encounter.

## 2020-01-04 DIAGNOSIS — Z20828 Contact with and (suspected) exposure to other viral communicable diseases: Secondary | ICD-10-CM | POA: Diagnosis not present

## 2020-01-09 DIAGNOSIS — Z23 Encounter for immunization: Secondary | ICD-10-CM | POA: Diagnosis not present

## 2020-01-11 DIAGNOSIS — Z20828 Contact with and (suspected) exposure to other viral communicable diseases: Secondary | ICD-10-CM | POA: Diagnosis not present

## 2020-01-14 DIAGNOSIS — F060 Psychotic disorder with hallucinations due to known physiological condition: Secondary | ICD-10-CM | POA: Diagnosis not present

## 2020-01-15 ENCOUNTER — Non-Acute Institutional Stay (SKILLED_NURSING_FACILITY): Payer: Medicare Other | Admitting: Internal Medicine

## 2020-01-15 DIAGNOSIS — K5909 Other constipation: Secondary | ICD-10-CM | POA: Diagnosis not present

## 2020-01-15 DIAGNOSIS — R600 Localized edema: Secondary | ICD-10-CM

## 2020-01-15 DIAGNOSIS — R441 Visual hallucinations: Secondary | ICD-10-CM | POA: Diagnosis not present

## 2020-01-15 DIAGNOSIS — D500 Iron deficiency anemia secondary to blood loss (chronic): Secondary | ICD-10-CM | POA: Diagnosis not present

## 2020-01-15 DIAGNOSIS — G20 Parkinson's disease: Secondary | ICD-10-CM

## 2020-01-17 NOTE — Progress Notes (Signed)
Location: [facility] manager of Service:  SNF (31)  Provider:   Code Status:  Goals of Care:  Advanced Directives 12/31/2019  Does Patient Have a Medical Advance Directive? Yes  Type of Advance Directive Out of facility DNR (pink MOST or yellow form);Living will  Does patient want to make changes to medical advance directive? No - Patient declined  Copy of [place] in Chart? -  Would patient like information on creating a medical advance directive? -  Pre-existing out of facility DNR order (yellow form or pink MOST form) Yellow form placed in chart (order not valid for inpatient use)     Chief Complaint  Patient presents with  . Acute Visit    HPI: Patient is a 86 y.o. female seen today for an acute visit for Her Parkinson Meds review per her husband Request Patient has history of Parkinson disease with visual hallucinations, peripheral neuropathy, hypothyroid, GERD, osteoporosis, hyperlipidemia, gout, dyspnea on exertionand Aortic stenosis She has h/o Parkinson Disease with Hallucinations. Her Neurology had reduced her Sinemet in Am to see if it will help her Hallucinations. Her husband wants the dose increased again as he thinks this will help with her mobility and Therapy. He is hoping if she can do transfers for herself. Her Hallucinations right now are under controlled with Seroquel Patient herself did not have any acute complain . Continues to have issues with staff and adjusting in SNF.   Other Issues are stable LE edema with SOB Doing well and is stable on Lasix Just had Echo which showed stable AS Constipation Doing well with Amitiza ? Inflammatory Arthritis Stable right now. No Follow up with Rheumatology Weight Loss Has lost some weight initially but now has stabilizedable    Past Medical History:  Diagnosis Date  . Anemia   . Anxiety   . Aortic stenosis    moderate by echo 2021   . Arthritis   . Broken arm    right   .  Chronic diastolic CHF (congestive heart failure) ([place])   . Family history of adverse reaction to anesthesia    pt. states sister vomits  . Fibromyalgia   . Fracture of thumb 09/01/2015   right  . Fracture, foot 09/01/2015   right  . GERD (gastroesophageal reflux disease)   . H/O hiatal hernia   . History of bronchitis   . History of kidney stones   . Hypercholesterolemia   . Hypertension    dr t turner  . Hypothyroidism   . Neuropathy   . Osteoporosis   . Parkinson disease ([place])   . PVD (peripheral vascular disease) ([facility])    99% stenosis of left anteiror tibial artery, mod stenosis of left distal SFA and popliteal artery followed by [name]  . RBBB    noted on EKG 2018  . Restless leg   . Shortness of breath    with exertion - chronic   . Sjogren's disease ([place])   . SOB (shortness of breath)    chronic due to diastolic dysfunction, deconditioning, obesity  . Tremor     Past Surgical History:  Procedure Laterality Date  . ABDOMINAL HYSTERECTOMY    . BACK SURGERY     4 back surgeries,   lumbar fusion  . CARDIAC CATHETERIZATION  2006   normal  . CYSTOSCOPY/URETEROSCOPY/HOLMIUM LASER/STENT PLACEMENT Left 01/06/2019   Procedure: CYSTOSCOPY/RETROGRADE/URETEROSCOPY/HOLMIUM LASER/BASKET RETRIEVAL/STENT PLACEMENT;  Surgeon: Ceasar Mons, MD;  Location: [facility];  Service: Urology;  Laterality: Left;  . EYE SURGERY Bilateral    cateracts  . falls     variious fall, broken wrist,and toes  . FRACTURE SURGERY Right April 2016   Wrist, Pt. fell  . kidney stone removal Left 01/20/2019  . RIGHT/LEFT HEART CATH AND CORONARY ANGIOGRAPHY N/A 02/05/2018   Procedure: RIGHT/LEFT HEART CATH AND CORONARY ANGIOGRAPHY;  Surgeon: Troy Sine, MD;  Location: [facility];  Service: Cardiovascular;  Laterality: N/A;  . SPINAL CORD STIMULATOR INSERTION N/A 09/09/2015   Procedure: LUMBAR SPINAL CORD STIMULATOR INSERTION;  Surgeon: Clydell Hakim, MD;  Location: [facility];   Service: Neurosurgery;  Laterality: N/A;  LUMBAR SPINAL CORD STIMULATOR INSERTION  . SPINE SURGERY  April 2013   Back X's 4  . TOTAL HIP ARTHROPLASTY     right  . WRIST FRACTURE SURGERY Bilateral     Allergies  Allergen Reactions  . Adrenalone   . Lactose Other (See Comments)    abd pain, lactose intolerant  . Morphine Other (See Comments)  . Sulfa Antibiotics Other (See Comments)    Headache, very sick  . Codeine Other (See Comments)    Reaction:  Headaches and nightmares   . Lactose Intolerance (Gi) Nausea And Vomiting  . Latex Rash  . Lyrica [Pregabalin] Swelling and Other (See Comments)    Reaction:  Leg swelling  . Other Other (See Comments)    Pt states that pain medications give her nightmares.    . Plaquenil [Hydroxychloroquine Sulfate] Other (See Comments)    Reaction:  GI upset   . Reglan [Metoclopramide] Other (See Comments)    Reaction:  GI upset   . Requip [Ropinirole Hcl] Other (See Comments)    Reaction:  GI upset   . Septra [Sulfamethoxazole-Trimethoprim] Nausea And Vomiting  . Shellfish Allergy Nausea And Vomiting    Outpatient Encounter Medications as of 01/15/2020  Medication Sig  . acetaminophen (TYLENOL) 500 MG tablet Take 500 mg by mouth. Once A Day - PRN  . acetaminophen (TYLENOL) 500 MG tablet Take 500 mg by mouth 2 (two) times daily.  Marland Kitchen albuterol (PROVENTIL HFA;VENTOLIN HFA) 108 (90 Base) MCG/ACT inhaler Inhale 2 puffs into the lungs every 6 (six) hours as needed for wheezing or shortness of breath.  Marland Kitchen aspirin EC 81 MG tablet Take 81 mg by mouth at bedtime.   . Calcium Carb-Cholecalciferol (CALCIUM+D3 PO) Take by mouth daily. 500-200; amt: 1 tablet; oral once a day  . carbidopa-levodopa (SINEMET IR) 25-100 MG tablet Take 2 tablets by mouth at bedtime.  . carbidopa-levodopa (SINEMET IR) 25-100 MG tablet Take 1 tablet by mouth. Once a day  . Cholecalciferol (VITAMIN D) 2000 units CAPS Take 2,000 Units by mouth daily.  Marland Kitchen denosumab (PROLIA) 60 MG/ML SOLN  injection Inject 60 mg into the skin every 6 (six) months. Administer in upper arm, thigh, or abdomen  . docusate sodium (COLACE) 100 MG capsule Take 100 mg by mouth at bedtime.  Marland Kitchen ezetimibe (ZETIA) 10 MG tablet TAKE 1 TABLET ONCE DAILY.  Marland Kitchen Ferrous Sulfate (SLOW FE) 142 (45 Fe) MG TBCR Take 1 tablet by mouth. Daily  . furosemide (LASIX) 20 MG tablet Take 20 mg by mouth daily.  . lansoprazole (PREVACID SOLUTAB) 30 MG disintegrating tablet Take 30 mg by mouth daily at 12 noon.  Marland Kitchen levothyroxine (SYNTHROID, LEVOTHROID) 50 MCG tablet Take 50 mcg by mouth daily before breakfast.   . lubiprostone (AMITIZA) 8 MCG capsule Take 16 mcg by mouth 2 (two) times daily with  a meal.   . metoprolol tartrate (LOPRESSOR) 25 MG tablet TAKE 1 TABLET ONCE DAILY.  . mineral oil-hydrophilic petrolatum (AQUAPHOR) ointment SPF 30 Apply to lesion on lip twice daily. Twice A Day  . Multiple Vitamin (MULTIVITAMIN WITH MINERALS) TABS tablet Take 1 tablet by mouth daily.  . Multiple Vitamins-Minerals (PRESERVISION AREDS 2) CHEW Chew by mouth. Once a day  . NONFORMULARY OR COMPOUNDED ITEM Peripheral Neuropathy Cream: Bupivacaine 1%, Doxepin 3%, Gabapentin 6%, Pentoxifylline 3%, Topiramate 1% Order faxed to [facility]  . nystatin (MYCOSTATIN/NYSTOP) powder Apply topically 2 (two) times daily as needed. (Patient taking differently: Apply 1 g topically 2 (two) times daily as needed (for affected areas under breast). )  . Polyethyl Glycol-Propyl Glycol (SYSTANE) 0.4-0.3 % SOLN Apply 1 drop to eye 2 (two) times daily as needed.  . potassium chloride (KLOR-CON) 10 MEQ tablet Take 10 mEq by mouth daily.  . pramipexole (MIRAPEX) 1 MG tablet Take 1 mg by mouth 2 (two) times daily.  . QUEtiapine (SEROQUEL) 25 MG tablet Take 25 mg by mouth 2 (two) times daily.   . rosuvastatin (CRESTOR) 20 MG tablet Take 1 tablet (20 mg total) by mouth daily.  Marland Kitchen senna-docusate (SENOKOT-S) 8.6-50 MG tablet Take 1 tablet by mouth at bedtime.   .  Skin Protectants, Misc. (EUCERIN) cream Apply 1 application topically daily.   Marland Kitchen Spacer/Aero-Holding Chambers (AEROCHAMBER MV) inhaler by Other route. Use as instructed as needed   No facility-administered encounter medications on file as of 01/15/2020.    Review of Systems:  Review of Systems  Constitutional: Negative.   HENT: Negative.   Respiratory: Negative.   Cardiovascular: Positive for leg swelling.  Gastrointestinal: Positive for constipation.  Genitourinary: Negative.   Musculoskeletal: Positive for gait problem.  Skin: Negative.   Psychiatric/Behavioral: Positive for confusion and hallucinations.  All other systems reviewed and are negative.   Health Maintenance  Topic Date Due  . TETANUS/TDAP  08/31/2025  . INFLUENZA VACCINE  Completed  . DEXA SCAN  Completed  . PNA vac Low Risk Adult  Completed    Physical Exam: Vitals:   01/25/20 0956  BP: 130/70  Pulse: 78  Temp: 97.6 F (36.4 C)  Weight: 136 lb 9.6 oz (62 kg)   Body mass index is 24.98 kg/m. Physical Exam Vitals reviewed.  HENT:     Nose: Nose normal.     Mouth/Throat:     Mouth: Mucous membranes are moist.     Pharynx: Oropharynx is clear.  Eyes:     Pupils: Pupils are equal, round, and reactive to light.  Cardiovascular:     Rate and Rhythm: Normal rate and regular rhythm.     Heart sounds: Murmur present.  Pulmonary:     Effort: Pulmonary effort is normal.     Breath sounds: Normal breath sounds.  Abdominal:     General: Abdomen is flat. Bowel sounds are normal.     Palpations: Abdomen is soft.  Musculoskeletal:        General: Swelling present.     Cervical back: Neck supple.  Skin:    General: Skin is warm.  Neurological:     General: No focal deficit present.     Mental Status: She is alert.  Psychiatric:        Mood and Affect: Mood normal.        Thought Content: Thought content normal.     Labs reviewed: Basic Metabolic Panel: Recent Labs    05/26/19 0906 05/26/19 0906  07/24/19 0000 09/08/19 0000 12/15/19 0000  NA 143  --  142 144 141  K 4.1   < > 4.3 3.5 3.9  CL 110  --  110  --  107  CO2 24  --  24  --  27*  GLUCOSE 97  --   --   --   --   BUN 13  --  12 10 16   CREATININE 0.65  --  0.7 0.7 0.7  CALCIUM 9.0  --  9.0  --  8.6*  TSH  --   --   --  1.62  --    < > = values in this interval not displayed.   Liver Function Tests: Recent Labs    07/14/19 0811 07/24/19 0000 09/08/19 0000  AST 21 43* 23  ALT 7 5* 12  ALKPHOS 56 0.4* 46  BILITOT 0.3  --   --   PROT 6.1 5.8  --   ALBUMIN 4.3 3.7  --    No results for input(s): LIPASE, AMYLASE in the last 8760 hours. No results for input(s): AMMONIA in the last 8760 hours. CBC: Recent Labs    05/26/19 0906 09/08/19 0000  WBC 7.4 7.6  NEUTROABS 4,862  --   HGB 9.4* 11.1*  HCT 29.8* 34*  MCV 89.5  --   PLT 302 245   Lipid Panel: Recent Labs    07/14/19 0811  CHOL 118  HDL 50  LDLCALC 32  TRIG 182*  CHOLHDL 2.4   No results found for: HGBA1C  Procedures since last visit: ECHOCARDIOGRAM COMPLETE  Result Date: 12/30/2019   ECHOCARDIOGRAM REPORT   Patient Name:   EVY SERVIN Date of Exam: 12/30/2019 Medical Rec #:  [id number]         Height:       62.0 in Accession #:    [id number]        Weight:       141.1 lb Date of Birth:  09/06/1931        BSA:          1.65 m Patient Age:    86 years          BP:           130/75 mmHg Patient Gender: F                 HR:           65 bpm. Exam Location:  [place] Procedure: 2D Echo, Color Doppler and Cardiac Doppler Indications:    I35.0 Aortic valve disease  History:        Patient has prior history of Echocardiogram examinations, most                 recent 12/11/2018. PVD, Aortic Valve Disease, Arrythmias:RBBB;                 Risk Factors:Hypertension and Dyslipidemia. Sjogren's syndrome.  Sonographer:    Lenard Galloway BA, RDCS Referring Phys: [place]  1. Left ventricular ejection fraction, by visual estimation,  is 55 to 60%. The left ventricle has normal function. Left ventricular septal wall thickness was mildly increased. There is mildly increased left ventricular hypertrophy.  2. Left ventricular diastolic parameters are consistent with Grade II diastolic dysfunction (pseudonormalization).  3. The left ventricle has no regional wall motion abnormalities.  4. Global right ventricle has normal systolic function.The right ventricular size is normal. No increase in right ventricular  wall thickness.  5. Left atrial size was normal.  6. Right atrial size was normal.  7. Mild mitral annular calcification.  8. Moderate thickening of the mitral valve leaflet(s).  9. The mitral valve is normal in structure. No evidence of mitral valve regurgitation. 10. The tricuspid valve is normal in structure. 11. The tricuspid valve is normal in structure. Tricuspid valve regurgitation is mild. 12. The aortic valve is tricuspid. Aortic valve regurgitation is not visualized. Moderate aortic valve stenosis. 13. Mean gradient has increased from 22-16mmgH and peak from 43-52 mmHg and DVI decreased from 0.34 to 0.29 since echo 12/11/18. 14. The pulmonic valve was grossly normal. Pulmonic valve regurgitation is trivial. 15. The aortic root was not well visualized. FINDINGS  Left Ventricle: Left ventricular ejection fraction, by visual estimation, is 55 to 60%. The left ventricle has normal function. The left ventricle has no regional wall motion abnormalities. The left ventricular internal cavity size was the left ventricle is normal in size. There is mildly increased left ventricular hypertrophy. Left ventricular diastolic parameters are consistent with Grade II diastolic dysfunction (pseudonormalization). Right Ventricle: The right ventricular size is normal. No increase in right ventricular wall thickness. Global RV systolic function is has normal systolic function. Left Atrium: Left atrial size was normal in size. Right Atrium: Right atrial size  was normal in size Pericardium: There is no evidence of pericardial effusion. Mitral Valve: The mitral valve is normal in structure. There is moderate thickening of the mitral valve leaflet(s). There is mild calcification of the mitral valve leaflet(s). Mild mitral annular calcification. No evidence of mitral valve regurgitation. Tricuspid Valve: The tricuspid valve is normal in structure. Tricuspid valve regurgitation is mild. Aortic Valve: The aortic valve is tricuspid. Aortic valve regurgitation is not visualized. Moderate aortic stenosis is present. Aortic valve mean gradient measures 26.0 mmHg. Aortic valve peak gradient measures 51.8 mmHg. Aortic valve area, by VTI measures 0.82 cm. Mean gradient has increased from 22-29mmgH and peak from 43-52 mmHg and DVI decreased from 0.34 to 0.29 since echo 12/11/18. Pulmonic Valve: The pulmonic valve was grossly normal. Pulmonic valve regurgitation is trivial. Pulmonic regurgitation is trivial. Aorta: The aortic root was not well visualized. IAS/Shunts: No atrial level shunt detected by color flow Doppler.  LEFT VENTRICLE PLAX 2D LVIDd:         3.10 cm  Diastology LVIDs:         2.00 cm  LV e' lateral:   4.57 cm/s LV PW:         1.10 cm  LV E/e' lateral: 17.9 LV IVS:        1.30 cm  LV e' medial:    4.68 cm/s LVOT diam:     1.90 cm  LV E/e' medial:  17.5 LV SV:         25 ml LV SV Index:   14.92 LVOT Area:     2.84 cm  RIGHT VENTRICLE RV Basal diam:  2.50 cm RV Mid diam:    2.20 cm RV S prime:     9.68 cm/s TAPSE (M-mode): 2.0 cm LEFT ATRIUM             Index LA diam:        3.40 cm 2.06 cm/m LA Vol (A2C):   60.0 ml 36.40 ml/m LA Vol (A4C):   27.7 ml 16.81 ml/m LA Biplane Vol: 40.3 ml 24.45 ml/m  AORTIC VALVE AV Area (Vmax):    0.83 cm AV Area (Vmean):   0.88  cm AV Area (VTI):     0.82 cm AV Vmax:           360.00 cm/s AV Vmean:          236.000 cm/s AV VTI:            0.737 m AV Peak Grad:      51.8 mmHg AV Mean Grad:      26.0 mmHg LVOT Vmax:         105.00 cm/s  LVOT Vmean:        73.300 cm/s LVOT VTI:          0.213 m LVOT/AV VTI ratio: 0.29  AORTA Ao Root diam: 2.80 cm MITRAL VALVE MV Area (PHT):                       SHUNTS MV PHT:                              Systemic VTI:  0.21 m MV Decel Time: 417 msec              Systemic Diam: 1.90 cm MV E velocity: 81.70 cm/s  103 cm/s MV A velocity: 110.00 cm/s 70.3 cm/s MV E/A ratio:  0.74        1.5  Jenkins Rouge MD Electronically signed by Jenkins Rouge MD Signature Date/Time: 12/30/2019/2:15:45 PM    Final     Assessment/Plan Parkinson's disease ([place]) Will increase her Sinemet to 2 tabs in am and 2 in Pm If worsening in hallucinations will reconsider Husband feels that will help with her mobility On Mirapex also  Bilateral edema of lower extremity Doing well with Lasix Bun and Creat stable  Hallucinations, visual Has been stable on Seroquel  Iron deficiency anemia due to chronic blood loss Hgb Stable on Iron Chronic constipation Will increase her Amitiza to 24 mcg   Hypothyroidism, unspecified type TSH is Normal in 9/20  Depression, unspecified depression type On Zoloft Will wait for Zoloft to increase right now  Sjogren's syndrome, with unspecified organ involvement ([place]) Has been stable Not seen any active symptoms Was on Plaquenil before D/W Her husband will wait for Rheumatology Referal Osteoporosis Up to date  on Prolia Shot Gets it out of facility Hyperlipidemia LDL 32   Labs/tests ordered:  * No order type specified * Next appt:  Visit date not found

## 2020-01-18 DIAGNOSIS — Z20828 Contact with and (suspected) exposure to other viral communicable diseases: Secondary | ICD-10-CM | POA: Diagnosis not present

## 2020-01-25 ENCOUNTER — Encounter: Payer: Self-pay | Admitting: Internal Medicine

## 2020-01-25 DIAGNOSIS — Z20828 Contact with and (suspected) exposure to other viral communicable diseases: Secondary | ICD-10-CM | POA: Diagnosis not present

## 2020-01-29 ENCOUNTER — Encounter: Payer: Self-pay | Admitting: Internal Medicine

## 2020-01-29 ENCOUNTER — Non-Acute Institutional Stay (SKILLED_NURSING_FACILITY): Payer: Medicare Other | Admitting: Internal Medicine

## 2020-01-29 DIAGNOSIS — R441 Visual hallucinations: Secondary | ICD-10-CM | POA: Diagnosis not present

## 2020-01-29 DIAGNOSIS — M154 Erosive (osteo)arthritis: Secondary | ICD-10-CM

## 2020-01-29 DIAGNOSIS — R600 Localized edema: Secondary | ICD-10-CM

## 2020-01-29 DIAGNOSIS — G20 Parkinson's disease: Secondary | ICD-10-CM | POA: Diagnosis not present

## 2020-01-29 NOTE — Progress Notes (Signed)
Location:    [facility] Room Number: 34 Place of Service:  SNF [phone]) Provider:  [name], MD [name], MD  Patient Care Team: [name], MD as PCP - General (Internal Medicine) [name], MD as PCP - Cardiology (Cardiology) [name], MD (Neurosurgery) [name], MD (Neurology) [name], MD (Inactive) as Consulting Physician (Gastroenterology) [name], MD as Consulting Physician (Urology) [name], Man X, NP as Nurse Practitioner (Internal Medicine)  Extended Emergency Contact Information Primary Emergency Contact: [facility] Address: [address]          [place], [place] [address] [name] of [place] Phone: [phone] Mobile Phone: [phone] Relation: Spouse Secondary Emergency Contact: [name] Mobile Phone: [phone] Relation: Daughter Preferred language: [name] Interpreter needed? No  Code Status:  DNR Goals of care: Advanced Directive information Advanced Directives 01/29/2020  Does Patient Have a Medical Advance Directive? Yes  Type of Advance Directive Out of facility DNR (pink MOST or yellow form);Living will  Does patient want to make changes to medical advance directive? No - Patient declined  Copy of [place] in Chart? -  Would patient like information on creating a medical advance directive? -  Pre-existing out of facility DNR order (yellow form or pink MOST form) Yellow form placed in chart (order not valid for inpatient use)     Chief Complaint  Patient presents with  . Acute Visit    Swelling in finger    HPI:  Pt is a 86 y.o. female seen today for an acute visit for Swelling of her Right Ring Finger  Patient has history of Parkinson disease with visual hallucinations, peripheral neuropathy, hypothyroid, GERD, osteoporosis, hyperlipidemia, gout, dyspnea on exertionand Aortic stenosis  She also has h/o Erosive  Arthritis. Use to see Dr Lenna Gilford. She has been on Celebrex which was stopped due to ? Anemia/ GI Loss. Also Has been on Plaquenil. Taken off due to GI upset She was stable but now she noticed swelling of her Right Ring Finger.with Mild Pain. No other joints are swollen. No Falls   Past Medical History:  Diagnosis Date  . Anemia   . Anxiety   . Aortic stenosis    moderate by echo 2021   . Arthritis   . Broken arm    right   . Chronic diastolic CHF (congestive heart failure) ([place])   . Family history of adverse reaction to anesthesia    pt. states sister vomits  . Fibromyalgia   . Fracture of thumb 09/01/2015   right  . Fracture, foot 09/01/2015   right  . GERD (gastroesophageal reflux disease)   . H/O hiatal hernia   . History of bronchitis   . History of kidney stones   . Hypercholesterolemia   . Hypertension    dr t turner  . Hypothyroidism   . Neuropathy   . Osteoporosis   . Parkinson disease ([place])   . PVD (peripheral vascular disease) ([facility])    99% stenosis of left anteiror tibial artery, mod stenosis of left distal SFA and popliteal artery followed by [name]  . RBBB    noted on EKG 2018  . Restless leg   . Shortness of breath    with exertion - chronic   . Sjogren's disease ([place])   . SOB (shortness of breath)    chronic due to diastolic dysfunction, deconditioning, obesity  . Tremor    Past Surgical History:  Procedure  Laterality Date  . ABDOMINAL HYSTERECTOMY    . BACK SURGERY     4 back surgeries,   lumbar fusion  . CARDIAC CATHETERIZATION  2006   normal  . CYSTOSCOPY/URETEROSCOPY/HOLMIUM LASER/STENT PLACEMENT Left 01/06/2019   Procedure: CYSTOSCOPY/RETROGRADE/URETEROSCOPY/HOLMIUM LASER/BASKET RETRIEVAL/STENT PLACEMENT;  Surgeon: Ceasar Mons, MD;  Location: [facility];  Service: Urology;  Laterality: Left;  . EYE SURGERY Bilateral    cateracts  . falls     variious fall, broken wrist,and toes  . FRACTURE SURGERY Right April 2016   Wrist, Pt.  fell  . kidney stone removal Left 01/20/2019  . RIGHT/LEFT HEART CATH AND CORONARY ANGIOGRAPHY N/A 02/05/2018   Procedure: RIGHT/LEFT HEART CATH AND CORONARY ANGIOGRAPHY;  Surgeon: Troy Sine, MD;  Location: [facility];  Service: Cardiovascular;  Laterality: N/A;  . SPINAL CORD STIMULATOR INSERTION N/A 09/09/2015   Procedure: LUMBAR SPINAL CORD STIMULATOR INSERTION;  Surgeon: Clydell Hakim, MD;  Location: [facility];  Service: Neurosurgery;  Laterality: N/A;  LUMBAR SPINAL CORD STIMULATOR INSERTION  . SPINE SURGERY  April 2013   Back X's 4  . TOTAL HIP ARTHROPLASTY     right  . WRIST FRACTURE SURGERY Bilateral     Allergies  Allergen Reactions  . Adrenalone   . Lactose Other (See Comments)    abd pain, lactose intolerant  . Morphine Other (See Comments)  . Sulfa Antibiotics Other (See Comments)    Headache, very sick  . Codeine Other (See Comments)    Reaction:  Headaches and nightmares   . Lactose Intolerance (Gi) Nausea And Vomiting  . Latex Rash  . Lyrica [Pregabalin] Swelling and Other (See Comments)    Reaction:  Leg swelling  . Other Other (See Comments)    Pt states that pain medications give her nightmares.    . Plaquenil [Hydroxychloroquine Sulfate] Other (See Comments)    Reaction:  GI upset   . Reglan [Metoclopramide] Other (See Comments)    Reaction:  GI upset   . Requip [Ropinirole Hcl] Other (See Comments)    Reaction:  GI upset   . Septra [Sulfamethoxazole-Trimethoprim] Nausea And Vomiting  . Shellfish Allergy Nausea And Vomiting    Allergies as of 01/29/2020      Reactions   Adrenalone    Lactose Other (See Comments)   abd pain, lactose intolerant   Morphine Other (See Comments)   Sulfa Antibiotics Other (See Comments)   Headache, very sick   Codeine Other (See Comments)   Reaction:  Headaches and nightmares    Lactose Intolerance (gi) Nausea And Vomiting   Latex Rash   Lyrica [pregabalin] Swelling, Other (See Comments)   Reaction:  Leg  swelling   Other Other (See Comments)   Pt states that pain medications give her nightmares.     Plaquenil [hydroxychloroquine Sulfate] Other (See Comments)   Reaction:  GI upset    Reglan [metoclopramide] Other (See Comments)   Reaction:  GI upset    Requip [ropinirole Hcl] Other (See Comments)   Reaction:  GI upset    Septra [sulfamethoxazole-trimethoprim] Nausea And Vomiting   Shellfish Allergy Nausea And Vomiting      Medication List       Accurate as of January 29, 2020  3:05 PM. If you have any questions, ask your nurse or doctor.        acetaminophen 500 MG tablet Commonly known as: TYLENOL Take 500 mg by mouth. Once A Day - PRN   TYLENOL 500 MG tablet  Generic drug: acetaminophen Take 500 mg by mouth 2 (two) times daily.   AeroChamber MV inhaler by Other route. Use as instructed as needed   albuterol 108 (90 Base) MCG/ACT inhaler Commonly known as: VENTOLIN HFA Inhale 2 puffs into the lungs every 6 (six) hours as needed for wheezing or shortness of breath.   aspirin EC 81 MG tablet Take 81 mg by mouth at bedtime.   CALCIUM+D3 PO Take by mouth daily. 500-200; amt: 1 tablet; oral once a day   carbidopa-levodopa 25-100 MG tablet Commonly known as: SINEMET IR Take 2 tablets by mouth at bedtime.   carbidopa-levodopa 25-100 MG tablet Commonly known as: SINEMET IR Take 2 tablets by mouth every morning.   denosumab 60 MG/ML Soln injection Commonly known as: PROLIA Inject 60 mg into the skin every 6 (six) months. Administer in upper arm, thigh, or abdomen   docusate sodium 100 MG capsule Commonly known as: COLACE Take 100 mg by mouth at bedtime.   eucerin cream Apply 1 application topically daily.   ezetimibe 10 MG tablet Commonly known as: ZETIA TAKE 1 TABLET ONCE DAILY.   furosemide 20 MG tablet Commonly known as: LASIX Take 20 mg by mouth daily.   lansoprazole 30 MG disintegrating tablet Commonly known as: PREVACID SOLUTAB Take 30 mg by mouth  daily at 12 noon.   levothyroxine 50 MCG tablet Commonly known as: SYNTHROID Take 50 mcg by mouth daily before breakfast.   lubiprostone 24 MCG capsule Commonly known as: AMITIZA Take 24 mcg by mouth 2 (two) times daily with a meal.   metoprolol tartrate 25 MG tablet Commonly known as: LOPRESSOR TAKE 1 TABLET ONCE DAILY.   mineral oil-hydrophilic petrolatum ointment SPF 30 Apply to lesion on lip twice daily. Twice A Day   multivitamin with minerals Tabs tablet Take 1 tablet by mouth daily.   NONFORMULARY OR COMPOUNDED ITEM Peripheral Neuropathy Cream: Bupivacaine 1%, Doxepin 3%, Gabapentin 6%, Pentoxifylline 3%, Topiramate 1% Order faxed to [facility]   nystatin powder Commonly known as: MYCOSTATIN/NYSTOP Apply topically 2 (two) times daily as needed. What changed:   how much to take  reasons to take this   potassium chloride 10 MEQ tablet Commonly known as: KLOR-CON Take 10 mEq by mouth daily.   pramipexole 1 MG tablet Commonly known as: MIRAPEX Take 1 mg by mouth 2 (two) times daily.   PreserVision AREDS 2 Chew Chew by mouth. Once a day   QUEtiapine 25 MG tablet Commonly known as: SEROQUEL Take 25 mg by mouth 2 (two) times daily.   rosuvastatin 20 MG tablet Commonly known as: CRESTOR Take 1 tablet (20 mg total) by mouth daily.   senna-docusate 8.6-50 MG tablet Commonly known as: Senokot-S Take 1 tablet by mouth at bedtime.   sertraline 25 MG tablet Commonly known as: ZOLOFT Take 50 mg by mouth daily.   Slow Fe 142 (45 Fe) MG Tbcr Generic drug: Ferrous Sulfate Take 1 tablet by mouth. Daily   Systane 0.4-0.3 % Soln Generic drug: Polyethyl Glycol-Propyl Glycol Apply 1 drop to eye 2 (two) times daily as needed.   Vitamin D 50 MCG (2000 UT) Caps Take 2,000 Units by mouth daily.       Review of Systems  Constitutional: Negative.   HENT: Negative.   Respiratory: Negative.   Cardiovascular: Positive for leg swelling.    Gastrointestinal: Positive for constipation.  Genitourinary: Negative.   Musculoskeletal: Positive for arthralgias, gait problem, joint swelling and myalgias.  Neurological: Positive for weakness.  Psychiatric/Behavioral: Positive for confusion, dysphoric mood and hallucinations.    Immunization History  Administered Date(s) Administered  . Influenza Split 09/09/2013  . Influenza, High Dose Seasonal PF 09/12/2018, 09/15/2019  . Influenza,inj,Quad PF,6+ Mos 09/11/2016  . Moderna SARS-COVID-2 Vaccination 12/12/2019, 01/09/2020  . Pneumococcal Conjugate-13 04/15/2014  . Pneumococcal Polysaccharide-23 09/28/2005  . Tdap 09/01/2015  . Zoster Recombinat (Shingrix) 06/11/2018, 09/10/2018   Pertinent  Health Maintenance Due  Topic Date Due  . INFLUENZA VACCINE  Completed  . DEXA SCAN  Completed  . PNA vac Low Risk Adult  Completed   Fall Risk  11/16/2019 04/23/2019 04/09/2019 10/29/2018 10/27/2018  Falls in the past year? 0 0 0 0 1  Comment - - - fell June 2019 -  Number falls in past yr: - 0 0 - 0  Injury with Fall? - 0 0 - 1  Comment - - - - -  Risk for fall due to : - - - - -  Risk for fall due to: Comment - - - - -   Functional Status Survey:    Vitals:   01/29/20 1453  BP: 130/80  Pulse: 72  Resp: 20  Temp: 99 F (37.2 C)  SpO2: 97%  Weight: 136 lb 9.6 oz (62 kg)  Height: 5\' 2"  (1.575 m)   Body mass index is 24.98 kg/m. Physical Exam Vitals reviewed.  Constitutional:      Appearance: Normal appearance.  HENT:     Head: Normocephalic.     Nose: Nose normal.     Mouth/Throat:     Mouth: Mucous membranes are moist.     Pharynx: Oropharynx is clear.  Eyes:     Pupils: Pupils are equal, round, and reactive to light.  Cardiovascular:     Rate and Rhythm: Normal rate.     Heart sounds: Murmur present.  Pulmonary:     Effort: Pulmonary effort is normal.     Breath sounds: Normal breath sounds.  Abdominal:     General: Abdomen is flat. Bowel sounds are normal.      Palpations: Abdomen is soft.  Musculoskeletal:        General: Swelling present.     Cervical back: Neck supple.     Comments: Right Ring Finger was swollen and Warm. Not Red. Is able to bend it but tender All other Fingers and wrist joint was normal with just Chronic Arthritis  Skin:    General: Skin is warm.  Neurological:     General: No focal deficit present.     Mental Status: She is alert and oriented to person, place, and time.  Psychiatric:        Mood and Affect: Mood normal.     Labs reviewed: Recent Labs    05/26/19 0906 05/26/19 0906 07/24/19 0000 09/08/19 0000 12/15/19 0000  NA 143  --  142 144 141  K 4.1   < > 4.3 3.5 3.9  CL 110  --  110  --  107  CO2 24  --  24  --  27*  GLUCOSE 97  --   --   --   --   BUN 13  --  12 10 16   CREATININE 0.65  --  0.7 0.7 0.7  CALCIUM 9.0  --  9.0  --  8.6*   < > = values in this interval not displayed.   Recent Labs    07/14/19 0811 07/24/19 0000 09/08/19 0000  AST 21 43* 23  ALT 7  5* 12  ALKPHOS 56 0.4* 46  BILITOT 0.3  --   --   PROT 6.1 5.8  --   ALBUMIN 4.3 3.7  --    Recent Labs    05/26/19 0906 09/08/19 0000  WBC 7.4 7.6  NEUTROABS 4,862  --   HGB 9.4* 11.1*  HCT 29.8* 34*  MCV 89.5  --   PLT 302 245   Lab Results  Component Value Date   TSH 1.62 09/08/2019   No results found for: HGBA1C Lab Results  Component Value Date   CHOL 118 07/14/2019   HDL 50 07/14/2019   LDLCALC 32 07/14/2019   TRIG 182 (H) 07/14/2019   CHOLHDL 2.4 07/14/2019    Significant Diagnostic Results in last 30 days:  No results found.  Assessment/Plan Right Ring Finger Swelling with her h/o Erosive Osteoarthritis  Xray of the hand to rule out fracture Will start her on Prednisone 20 mg QD for 7 days Probably referal back to Rheumatology. Also Check CRP  Constipation Doing well in Amitiza increased dose  Other issues  Parkinson's disease ([place])   increased her Sinemet to 2 tabs in am and 2 in Pm If  worsening in hallucinations will reconsider Husband feels that will help with her mobility On Mirapex also  Bilateral edema of lower extremity Doing well with Lasix Bun and Creat stable  Hallucinations, visual Has been stable on Seroquel  Iron deficiency anemia due to chronic blood loss Hgb Stable on Iron  Hypothyroidism, unspecified type TSH is Normal in 9/20  Depression, unspecified depression type On Zoloft Will wait for Zoloft to increase right now  Osteoporosis Up to date on [employer] it out of facility Hyperlipidemia LDL 32  Family/ staff Communication:   Labs/tests ordered:  CBC,CRP  Total time spent in this patient care encounter was  25_  minutes; greater than 50% of the visit spent counseling patient and staff, reviewing records , Labs and coordinating care for problems addressed at this encounter.

## 2020-01-31 DIAGNOSIS — M79641 Pain in right hand: Secondary | ICD-10-CM | POA: Diagnosis not present

## 2020-02-01 ENCOUNTER — Encounter: Payer: Self-pay | Admitting: Nurse Practitioner

## 2020-02-01 DIAGNOSIS — M79641 Pain in right hand: Secondary | ICD-10-CM | POA: Insufficient documentation

## 2020-02-02 DIAGNOSIS — R7982 Elevated C-reactive protein (CRP): Secondary | ICD-10-CM | POA: Diagnosis not present

## 2020-02-02 DIAGNOSIS — R6889 Other general symptoms and signs: Secondary | ICD-10-CM | POA: Diagnosis not present

## 2020-02-03 LAB — CBC AND DIFFERENTIAL
HCT: 34 — AB (ref 36–46)
Hemoglobin: 11.3 — AB (ref 12.0–16.0)
Neutrophils Absolute: 4485
Platelets: 337 (ref 150–399)
WBC: 7.8

## 2020-02-03 LAB — CBC: RBC: 3.68 — AB (ref 3.87–5.11)

## 2020-02-05 ENCOUNTER — Encounter: Payer: Self-pay | Admitting: Nurse Practitioner

## 2020-02-05 ENCOUNTER — Non-Acute Institutional Stay (SKILLED_NURSING_FACILITY): Payer: Medicare Other | Admitting: Nurse Practitioner

## 2020-02-05 DIAGNOSIS — K5909 Other constipation: Secondary | ICD-10-CM

## 2020-02-05 DIAGNOSIS — G8929 Other chronic pain: Secondary | ICD-10-CM

## 2020-02-05 DIAGNOSIS — R413 Other amnesia: Secondary | ICD-10-CM | POA: Diagnosis not present

## 2020-02-05 DIAGNOSIS — D500 Iron deficiency anemia secondary to blood loss (chronic): Secondary | ICD-10-CM | POA: Diagnosis not present

## 2020-02-05 DIAGNOSIS — G20 Parkinson's disease: Secondary | ICD-10-CM | POA: Diagnosis not present

## 2020-02-05 DIAGNOSIS — I5032 Chronic diastolic (congestive) heart failure: Secondary | ICD-10-CM | POA: Diagnosis not present

## 2020-02-05 DIAGNOSIS — M5440 Lumbago with sciatica, unspecified side: Secondary | ICD-10-CM | POA: Diagnosis not present

## 2020-02-05 DIAGNOSIS — E02 Subclinical iodine-deficiency hypothyroidism: Secondary | ICD-10-CM

## 2020-02-05 DIAGNOSIS — I10 Essential (primary) hypertension: Secondary | ICD-10-CM

## 2020-02-05 DIAGNOSIS — M79641 Pain in right hand: Secondary | ICD-10-CM

## 2020-02-05 DIAGNOSIS — F323 Major depressive disorder, single episode, severe with psychotic features: Secondary | ICD-10-CM

## 2020-02-05 LAB — C REACTIVE PROTEIN, FLUID

## 2020-02-05 NOTE — Progress Notes (Signed)
Location:   [facility] Room Number: 3 Place of Service:  SNF (31) Provider:  Merlean Pizzini NP  [name], MD  Patient Care Team: [name], MD as PCP - General (Internal Medicine) [name], MD as PCP - Cardiology (Cardiology) [name], MD (Neurosurgery) [name], MD (Neurology) [name], MD (Inactive) as Consulting Physician (Gastroenterology) [name], MD as Consulting Physician (Urology) Jarmar Rousseau X, NP as Nurse Practitioner (Internal Medicine)  Extended Emergency Contact Information Primary Emergency Contact: [facility] Address: [address]          [place], [place] [address] [name] of [place] Phone: [phone] Mobile Phone: [phone] Relation: Spouse Secondary Emergency Contact: [name] Mobile Phone: [phone] Relation: Daughter Preferred language: [name] Interpreter needed? No  Code Status:  Full Code Goals of care: Advanced Directive information Advanced Directives 02/05/2020  Does Patient Have a Medical Advance Directive? Yes  Type of Advance Directive Out of facility DNR (pink MOST or yellow form);Living will  Does patient want to make changes to medical advance directive? No - Patient declined  Copy of [place] in Chart? -  Would patient like information on creating a medical advance directive? -  Pre-existing out of facility DNR order (yellow form or pink MOST form) Yellow form placed in chart (order not valid for inpatient use)     Chief Complaint  Patient presents with  . Medical Management of Chronic Issues    HPI:  Pt is a 86 y.o. female seen today for medical management of chronic diseases.    The patient resides in SNF [facility] for safety, care assistance, w/c for mobility. Her mood is stable, on Sertraline 50mg  qd, Quetiapine 25mg  bid. Hx of HTN, blood pressure is controlled on Metoprolol 25mg  bid. CHF/Edema BLE, trace, on  Furosemide 20mg  qd. Anemia, stable, on Fe. OA, multiples sites, lower back, hand/fingers, on Tylenol 500mg  bid, 7 day course Prednisone for right 4th finger pain started 01/29/20. Parkinson's disease, w/c dependent, finger tremor in fingers, not disabling, on Sinemet 25/100mg  II bid, Mirapex 1mg  bid. Constipation, stable, on Senokot S I qd, Colace 100mg  qd, Amitiza 55mcg bid. Hypothyroidism, stable, on Levothyroxine 50mcg qd.    Past Medical History:  Diagnosis Date  . Anemia   . Anxiety   . Aortic stenosis    moderate by echo 2021   . Arthritis   . Broken arm    right   . Chronic diastolic CHF (congestive heart failure) ([place])   . Family history of adverse reaction to anesthesia    pt. states sister vomits  . Fibromyalgia   . Fracture of thumb 09/01/2015   right  . Fracture, foot 09/01/2015   right  . GERD (gastroesophageal reflux disease)   . H/O hiatal hernia   . History of bronchitis   . History of kidney stones   . Hypercholesterolemia   . Hypertension    dr t turner  . Hypothyroidism   . Neuropathy   . Osteoporosis   . Parkinson disease ([place])   . PVD (peripheral vascular disease) ([facility])    99% stenosis of left anteiror tibial artery, mod stenosis of left distal SFA and popliteal artery followed by [name]  . RBBB    noted on EKG 2018  . Restless leg   . Shortness of breath    with exertion - chronic   . Sjogren's disease ([place])   . SOB (shortness of breath)  chronic due to diastolic dysfunction, deconditioning, obesity  . Tremor    Past Surgical History:  Procedure Laterality Date  . ABDOMINAL HYSTERECTOMY    . BACK SURGERY     4 back surgeries,   lumbar fusion  . CARDIAC CATHETERIZATION  2006   normal  . CYSTOSCOPY/URETEROSCOPY/HOLMIUM LASER/STENT PLACEMENT Left 01/06/2019   Procedure: CYSTOSCOPY/RETROGRADE/URETEROSCOPY/HOLMIUM LASER/BASKET RETRIEVAL/STENT PLACEMENT;  Surgeon: Ceasar Mons, MD;  Location: [facility];  Service: Urology;  Laterality:  Left;  . EYE SURGERY Bilateral    cateracts  . falls     variious fall, broken wrist,and toes  . FRACTURE SURGERY Right April 2016   Wrist, Pt. fell  . kidney stone removal Left 01/20/2019  . RIGHT/LEFT HEART CATH AND CORONARY ANGIOGRAPHY N/A 02/05/2018   Procedure: RIGHT/LEFT HEART CATH AND CORONARY ANGIOGRAPHY;  Surgeon: Troy Sine, MD;  Location: [facility];  Service: Cardiovascular;  Laterality: N/A;  . SPINAL CORD STIMULATOR INSERTION N/A 09/09/2015   Procedure: LUMBAR SPINAL CORD STIMULATOR INSERTION;  Surgeon: Clydell Hakim, MD;  Location: [facility];  Service: Neurosurgery;  Laterality: N/A;  LUMBAR SPINAL CORD STIMULATOR INSERTION  . SPINE SURGERY  April 2013   Back X's 4  . TOTAL HIP ARTHROPLASTY     right  . WRIST FRACTURE SURGERY Bilateral     Allergies  Allergen Reactions  . Adrenalone   . Lactose Other (See Comments)    abd pain, lactose intolerant  . Morphine Other (See Comments)  . Sulfa Antibiotics Other (See Comments)    Headache, very sick  . Codeine Other (See Comments)    Reaction:  Headaches and nightmares   . Lactose Intolerance (Gi) Nausea And Vomiting  . Latex Rash  . Lyrica [Pregabalin] Swelling and Other (See Comments)    Reaction:  Leg swelling  . Other Other (See Comments)    Pt states that pain medications give her nightmares.    . Plaquenil [Hydroxychloroquine Sulfate] Other (See Comments)    Reaction:  GI upset   . Reglan [Metoclopramide] Other (See Comments)    Reaction:  GI upset   . Requip [Ropinirole Hcl] Other (See Comments)    Reaction:  GI upset   . Septra [Sulfamethoxazole-Trimethoprim] Nausea And Vomiting  . Shellfish Allergy Nausea And Vomiting    Allergies as of 02/05/2020      Reactions   Adrenalone    Lactose Other (See Comments)   abd pain, lactose intolerant   Morphine Other (See Comments)   Sulfa Antibiotics Other (See Comments)   Headache, very sick   Codeine Other (See Comments)   Reaction:  Headaches  and nightmares    Lactose Intolerance (gi) Nausea And Vomiting   Latex Rash   Lyrica [pregabalin] Swelling, Other (See Comments)   Reaction:  Leg swelling   Other Other (See Comments)   Pt states that pain medications give her nightmares.     Plaquenil [hydroxychloroquine Sulfate] Other (See Comments)   Reaction:  GI upset    Reglan [metoclopramide] Other (See Comments)   Reaction:  GI upset    Requip [ropinirole Hcl] Other (See Comments)   Reaction:  GI upset    Septra [sulfamethoxazole-trimethoprim] Nausea And Vomiting   Shellfish Allergy Nausea And Vomiting      Medication List       Accurate as of February 05, 2020 11:59 PM. If you have any questions, ask your nurse or doctor.        acetaminophen 500 MG tablet Commonly known as:  TYLENOL Take 500 mg by mouth. Once A Day - PRN   TYLENOL 500 MG tablet Generic drug: acetaminophen Take 500 mg by mouth 2 (two) times daily.   AeroChamber MV inhaler by Other route. Use as instructed as needed   albuterol 108 (90 Base) MCG/ACT inhaler Commonly known as: VENTOLIN HFA Inhale 2 puffs into the lungs every 6 (six) hours as needed for wheezing or shortness of breath.   aspirin EC 81 MG tablet Take 81 mg by mouth at bedtime.   CALCIUM+D3 PO Take by mouth daily. 500-200; amt: 1 tablet; oral once a day   carbidopa-levodopa 25-100 MG tablet Commonly known as: SINEMET IR Take 2 tablets by mouth in the morning and at bedtime. What changed: Another medication with the same name was removed. Continue taking this medication, and follow the directions you see here. Changed by: Nerissa Constantin X Yishai Rehfeld, NP   denosumab 60 MG/ML Soln injection Commonly known as: PROLIA Inject 60 mg into the skin every 6 (six) months. Administer in upper arm, thigh, or abdomen   docusate sodium 100 MG capsule Commonly known as: COLACE Take 100 mg by mouth at bedtime.   eucerin cream Apply 1 application topically daily.   ezetimibe 10 MG tablet Commonly known  as: ZETIA TAKE 1 TABLET ONCE DAILY.   furosemide 20 MG tablet Commonly known as: LASIX Take 20 mg by mouth daily.   lansoprazole 30 MG disintegrating tablet Commonly known as: PREVACID SOLUTAB Take 30 mg by mouth daily at 12 noon.   levothyroxine 50 MCG tablet Commonly known as: SYNTHROID Take 50 mcg by mouth daily before breakfast.   lubiprostone 24 MCG capsule Commonly known as: AMITIZA Take 24 mcg by mouth 2 (two) times daily with a meal.   metoprolol tartrate 25 MG tablet Commonly known as: LOPRESSOR TAKE 1 TABLET ONCE DAILY.   mineral oil-hydrophilic petrolatum ointment SPF 30 Apply to lesion on lip twice daily. Twice A Day   multivitamin with minerals Tabs tablet Take 1 tablet by mouth daily.   NONFORMULARY OR COMPOUNDED ITEM Peripheral Neuropathy Cream: Bupivacaine 1%, Doxepin 3%, Gabapentin 6%, Pentoxifylline 3%, Topiramate 1% Order faxed to [facility]   nystatin powder Commonly known as: MYCOSTATIN/NYSTOP Apply topically 2 (two) times daily as needed. What changed:   how much to take  reasons to take this   potassium chloride 10 MEQ tablet Commonly known as: KLOR-CON Take 10 mEq by mouth daily.   pramipexole 1 MG tablet Commonly known as: MIRAPEX Take 1 mg by mouth 2 (two) times daily.   predniSONE 20 MG tablet Commonly known as: DELTASONE Take 20 mg by mouth daily with breakfast.   PreserVision AREDS 2 Chew Chew by mouth. Once a day   QUEtiapine 25 MG tablet Commonly known as: SEROQUEL Take 25 mg by mouth 2 (two) times daily.   rosuvastatin 20 MG tablet Commonly known as: CRESTOR Take 1 tablet (20 mg total) by mouth daily.   senna-docusate 8.6-50 MG tablet Commonly known as: Senokot-S Take 1 tablet by mouth at bedtime.   sertraline 25 MG tablet Commonly known as: ZOLOFT Take 50 mg by mouth daily.   Slow Fe 142 (45 Fe) MG Tbcr Generic drug: Ferrous Sulfate Take 1 tablet by mouth. Daily   Systane 0.4-0.3 % Soln Generic  drug: Polyethyl Glycol-Propyl Glycol Apply 1 drop to eye 2 (two) times daily as needed.   Vitamin D 50 MCG (2000 UT) Caps Take 2,000 Units by mouth daily.      ROS  was provided with assistance of staff.  Review of Systems  Constitutional: Positive for unexpected weight change. Negative for activity change, appetite change, chills, diaphoresis, fatigue and fever.       Weight loss about #6Ibs in the past month.   HENT: Positive for hearing loss. Negative for congestion and voice change.   Eyes: Negative for visual disturbance.  Respiratory: Positive for shortness of breath. Negative for cough and wheezing.        DOE  Cardiovascular: Positive for leg swelling. Negative for chest pain and palpitations.  Gastrointestinal: Negative for abdominal distention, abdominal pain, constipation, nausea and vomiting.  Genitourinary: Negative for difficulty urinating, dysuria and urgency.  Musculoskeletal: Positive for arthralgias, back pain, gait problem and myalgias.  Skin: Negative for color change and pallor.  Neurological: Negative for dizziness, speech difficulty, weakness and headaches.       Memory lapses.   Psychiatric/Behavioral: Positive for confusion, dysphoric mood and hallucinations. Negative for agitation, behavioral problems and sleep disturbance. The patient is not nervous/anxious.     Immunization History  Administered Date(s) Administered  . Influenza Split 09/09/2013  . Influenza, High Dose Seasonal PF 09/12/2018, 09/15/2019  . Influenza,inj,Quad PF,6+ Mos 09/11/2016  . Moderna SARS-COVID-2 Vaccination 12/12/2019, 01/09/2020  . Pneumococcal Conjugate-13 04/15/2014  . Pneumococcal Polysaccharide-23 09/28/2005  . Tdap 09/01/2015  . Zoster Recombinat (Shingrix) 06/11/2018, 09/10/2018   Pertinent  Health Maintenance Due  Topic Date Due  . INFLUENZA VACCINE  Completed  . DEXA SCAN  Completed  . PNA vac Low Risk Adult  Completed   Fall Risk  11/16/2019 04/23/2019 04/09/2019  10/29/2018 10/27/2018  Falls in the past year? 0 0 0 0 1  Comment - - - fell June 2019 -  Number falls in past yr: - 0 0 - 0  Injury with Fall? - 0 0 - 1  Comment - - - - -  Risk for fall due to : - - - - -  Risk for fall due to: Comment - - - - -   Functional Status Survey:    Vitals:   02/05/20 0939  BP: 128/70  Pulse: 70  Resp: 17  Temp: (!) 97.4 F (36.3 C)  SpO2: 96%  Weight: 134 lb 6.4 oz (61 kg)  Height: 5\' 2"  (1.575 m)   Body mass index is 24.58 kg/m. Physical Exam Vitals and nursing note reviewed.  Constitutional:      General: She is not in acute distress.    Appearance: Normal appearance. She is not ill-appearing.  HENT:     Head: Normocephalic and atraumatic.     Nose: Nose normal.     Mouth/Throat:     Mouth: Mucous membranes are moist.  Eyes:     Extraocular Movements: Extraocular movements intact.     Conjunctiva/sclera: Conjunctivae normal.     Pupils: Pupils are equal, round, and reactive to light.  Cardiovascular:     Rate and Rhythm: Normal rate and regular rhythm.     Heart sounds: Murmur present.  Pulmonary:     Breath sounds: No wheezing or rales.  Abdominal:     General: Bowel sounds are normal. There is no distension.     Palpations: Abdomen is soft.     Tenderness: There is no abdominal tenderness. There is no guarding or rebound.  Musculoskeletal:     Cervical back: Normal range of motion and neck supple.     Right lower leg: Edema present.     Left lower leg: Edema present.  Comments: Trace edema BLE. The R 4th finger pain is improved, no redness or warmth today. Arthritic changes in fingers, R>L.   Skin:    General: Skin is warm and dry.  Neurological:     General: No focal deficit present.     Mental Status: She is alert. Mental status is at baseline.     Motor: No weakness.     Coordination: Coordination normal.     Gait: Gait abnormal.     Comments: Oriented to person, place.   Psychiatric:        Mood and Affect: Mood  normal.        Behavior: Behavior normal.     Labs reviewed: Recent Labs    05/26/19 0906 05/26/19 0906 07/24/19 0000 09/08/19 0000 12/15/19 0000  NA 143  --  142 144 141  K 4.1   < > 4.3 3.5 3.9  CL 110  --  110  --  107  CO2 24  --  24  --  27*  GLUCOSE 97  --   --   --   --   BUN 13  --  12 10 16   CREATININE 0.65  --  0.7 0.7 0.7  CALCIUM 9.0  --  9.0  --  8.6*   < > = values in this interval not displayed.   Recent Labs    07/14/19 0811 07/24/19 0000 09/08/19 0000  AST 21 43* 23  ALT 7 5* 12  ALKPHOS 56 0.4* 46  BILITOT 0.3  --   --   PROT 6.1 5.8  --   ALBUMIN 4.3 3.7  --    Recent Labs    05/26/19 0906 09/08/19 0000 02/03/20 0000  WBC 7.4 7.6 7.8  NEUTROABS 4,862  --  4,485  HGB 9.4* 11.1* 11.3*  HCT 29.8* 34* 34*  MCV 89.5  --   --   PLT 302 245 337   Lab Results  Component Value Date   TSH 1.62 09/08/2019   No results found for: HGBA1C Lab Results  Component Value Date   CHOL 118 07/14/2019   HDL 50 07/14/2019   LDLCALC 32 07/14/2019   TRIG 182 (H) 07/14/2019   CHOLHDL 2.4 07/14/2019    Significant Diagnostic Results in last 30 days:  No results found.  Assessment/Plan Chronic diastolic CHF (congestive heart failure) Compensated, trace edema in ankles, continue Furosemide.   Hypertension Blood pressure is controlled, continue Metoprolol.   Chronic constipation Stable, continue Senokot S I qd, Colace 100mg  qd, Amitiza 31mcg bid.   Hypothyroidism Stable, TSH wnl 08/2019, continue Levothyroxine 52mcg qd.   Parkinson's disease Fine tremors in fingers, not disabling, w/c for mobility, continue Sinemet, MiraPex, no increased hallucination since increased Sinemet.   Chronic back pain Chronic, continue Tylenol, w/c for mobility, activity as tolerated.   Depression, psychotic ([place]) Her mood is stable, continue Sertraline, Quetiapine.   Iron deficiency anemia Stable, continue Fe. 02/03/20 Hgb 11.3  Memory loss Continue SNF [facility] for  safety, care assistance.   Right hand pain 01/31/20 X-ray right hand 3 views: OA, no acute bony injury. CRP 1.9 Improved, end of the 7 day course of Prednisone.  Continue Tylenol.      Family/ staff Communication: plan of care reviewed with the patient and charge nurse.   Labs/tests ordered:  none  Time spend 25 minutes.

## 2020-02-05 NOTE — Assessment & Plan Note (Signed)
Compensated, trace edema in ankles, continue Furosemide.

## 2020-02-05 NOTE — Assessment & Plan Note (Signed)
Continue SNF [facility] for safety, care assistance.  

## 2020-02-05 NOTE — Assessment & Plan Note (Signed)
Stable, continue Senokot S I qd, Colace 100mg  qd, Amitiza 72mcg bid.

## 2020-02-05 NOTE — Assessment & Plan Note (Signed)
Her mood is stable, continue Sertraline, Quetiapine.  

## 2020-02-05 NOTE — Assessment & Plan Note (Signed)
Stable, TSH wnl 08/2019, continue Levothyroxine 37mcg qd.

## 2020-02-05 NOTE — Assessment & Plan Note (Signed)
Stable, continue Fe. 02/03/20 Hgb 11.3

## 2020-02-05 NOTE — Assessment & Plan Note (Signed)
Blood pressure is controlled, continue Metoprolol. 

## 2020-02-05 NOTE — Assessment & Plan Note (Signed)
Fine tremors in fingers, not disabling, w/c for mobility, continue Sinemet, MiraPex, no increased hallucination since increased Sinemet.

## 2020-02-05 NOTE — Assessment & Plan Note (Signed)
Chronic, continue Tylenol, w/c for mobility, activity as tolerated.

## 2020-02-05 NOTE — Assessment & Plan Note (Addendum)
01/31/20 X-ray right hand 3 views: OA, no acute bony injury. CRP 1.9 Improved, end of the 7 day course of Prednisone.  Continue Tylenol.

## 2020-02-10 ENCOUNTER — Encounter: Payer: Self-pay | Admitting: Nurse Practitioner

## 2020-02-10 ENCOUNTER — Non-Acute Institutional Stay (SKILLED_NURSING_FACILITY): Payer: Medicare Other | Admitting: Nurse Practitioner

## 2020-02-10 DIAGNOSIS — M79641 Pain in right hand: Secondary | ICD-10-CM

## 2020-02-10 DIAGNOSIS — G20 Parkinson's disease: Secondary | ICD-10-CM | POA: Diagnosis not present

## 2020-02-10 DIAGNOSIS — G8929 Other chronic pain: Secondary | ICD-10-CM

## 2020-02-10 DIAGNOSIS — D500 Iron deficiency anemia secondary to blood loss (chronic): Secondary | ICD-10-CM | POA: Diagnosis not present

## 2020-02-10 DIAGNOSIS — M6281 Muscle weakness (generalized): Secondary | ICD-10-CM | POA: Diagnosis not present

## 2020-02-10 DIAGNOSIS — R279 Unspecified lack of coordination: Secondary | ICD-10-CM | POA: Diagnosis not present

## 2020-02-10 DIAGNOSIS — M5440 Lumbago with sciatica, unspecified side: Secondary | ICD-10-CM | POA: Diagnosis not present

## 2020-02-10 DIAGNOSIS — R278 Other lack of coordination: Secondary | ICD-10-CM | POA: Diagnosis not present

## 2020-02-10 DIAGNOSIS — R41841 Cognitive communication deficit: Secondary | ICD-10-CM | POA: Diagnosis not present

## 2020-02-10 DIAGNOSIS — F323 Major depressive disorder, single episode, severe with psychotic features: Secondary | ICD-10-CM | POA: Diagnosis not present

## 2020-02-10 DIAGNOSIS — I10 Essential (primary) hypertension: Secondary | ICD-10-CM

## 2020-02-10 DIAGNOSIS — R2681 Unsteadiness on feet: Secondary | ICD-10-CM | POA: Diagnosis not present

## 2020-02-10 NOTE — Assessment & Plan Note (Signed)
Stable, continue Tylenol, w/c, activity as tolerated.

## 2020-02-10 NOTE — Assessment & Plan Note (Signed)
W/c for mobility, not disabling, continue Sinemet, MIraPex.

## 2020-02-10 NOTE — Progress Notes (Signed)
.  Location:   [facility] Room Number: 3 Place of Service:  SNF (31) Provider:  Isaak Delmundo, NP  [name], MD  Patient Care Team: [name], MD as PCP - General (Internal Medicine) [name], MD as PCP - Cardiology (Cardiology) [name], MD (Neurosurgery) [name], MD (Neurology) [name], MD (Inactive) as Consulting Physician (Gastroenterology) [name], MD as Consulting Physician (Urology) Evamaria Detore X, NP as Nurse Practitioner (Internal Medicine)  Extended Emergency Contact Information Primary Emergency Contact: [facility] Address: [address]          [place], [place] [address] [name] of [place] Phone: [phone] Mobile Phone: [phone] Relation: Spouse Secondary Emergency Contact: [name] Mobile Phone: [phone] Relation: Daughter Preferred language: [name] Interpreter needed? No  Code Status:  FULL CODE Goals of care: Advanced Directive information Advanced Directives 02/10/2020  Does Patient Have a Medical Advance Directive? Yes  Type of Paramedic of [place];Out of facility DNR (pink MOST or yellow form)  Does patient want to make changes to medical advance directive? No - Patient declined  Copy of [place] in Chart? Yes - validated most recent copy scanned in chart (See row information)  Would patient like information on creating a medical advance directive? -  Pre-existing out of facility DNR order (yellow form or pink MOST form) -     Chief Complaint  Patient presents with  . Acute Visit    Right middle finger swelling, warmth, and pain.    HPI:  Pt is a 86 y.o. female seen today for an acute visit for lare up, right middle finger swollen, warmth, tenderness. 01/29/20 right middle finger swollen, hx of erosive arthritis, 7 day course of Prednisone 34m qd. 01/31/20 X-ray right hand 3 views: OA, no acute  bony injury. 02/02/20 CRP 1.9. Hx of erosive arthritis, taking Tylenol 5025mbid. HTN, blood pressure is controlled on Metoprolol 2543md. Parkinson's disease, w/c for mobility, on MiraPex, Sinemet.    Past Medical History:  Diagnosis Date  . Anemia   . Anxiety   . Aortic stenosis    moderate by echo 2021   . Arthritis   . Broken arm    right   . Chronic diastolic CHF (congestive heart failure) ([facility] . Family history of adverse reaction to anesthesia    pt. states sister vomits  . Fibromyalgia   . Fracture of thumb 09/01/2015   right  . Fracture, foot 09/01/2015   right  . GERD (gastroesophageal reflux disease)   . H/O hiatal hernia   . History of bronchitis   . History of kidney stones   . Hypercholesterolemia   . Hypertension    dr t turner  . Hypothyroidism   . Neuropathy   . Osteoporosis   . Parkinson disease ([facility] . PVD (peripheral vascular disease) (HCC)    99% stenosis of left anteiror tibial artery, mod stenosis of left distal SFA and popliteal artery followed by Dr. FieOneida Alar RBBB    noted on EKG 2018  . Restless leg   . Shortness of breath    with exertion - chronic   . S[place]ren's disease ([facility] . SOB (shortness of breath)    chronic due to diastolic dysfunction, deconditioning, obesity  . Tremor    Past Surgical History:  Procedure Laterality Date  . ABDOMINAL HYSTERECTOMY    . BACK SURGERY  4 back surgeries,   lumbar fusion  . CARDIAC CATHETERIZATION  2006   normal  . CYSTOSCOPY/URETEROSCOPY/HOLMIUM LASER/STENT PLACEMENT Left 01/06/2019   Procedure: CYSTOSCOPY/RETROGRADE/URETEROSCOPY/HOLMIUM LASER/BASKET RETRIEVAL/STENT PLACEMENT;  Surgeon: Ceasar Mons, MD;  Location: [facility];  Service: Urology;  Laterality: Left;  . EYE SURGERY Bilateral    cateracts  . falls     variious fall, broken wrist,and toes  . FRACTURE SURGERY Right April 2016   Wrist, Pt. fell  . kidney stone removal Left 01/20/2019  . RIGHT/LEFT HEART CATH AND CORONARY  ANGIOGRAPHY N/A 02/05/2018   Procedure: RIGHT/LEFT HEART CATH AND CORONARY ANGIOGRAPHY;  Surgeon: Troy Sine, MD;  Location: [facility];  Service: Cardiovascular;  Laterality: N/A;  . SPINAL CORD STIMULATOR INSERTION N/A 09/09/2015   Procedure: LUMBAR SPINAL CORD STIMULATOR INSERTION;  Surgeon: Clydell Hakim, MD;  Location: [facility];  Service: Neurosurgery;  Laterality: N/A;  LUMBAR SPINAL CORD STIMULATOR INSERTION  . SPINE SURGERY  April 2013   Back X's 4  . TOTAL HIP ARTHROPLASTY     right  . WRIST FRACTURE SURGERY Bilateral     Allergies  Allergen Reactions  . Adrenalone   . Lactose Other (See Comments)    abd pain, lactose intolerant  . Morphine Other (See Comments)  . Sulfa Antibiotics Other (See Comments)    Headache, very sick  . Codeine Other (See Comments)    Reaction:  Headaches and nightmares   . Lactose Intolerance (Gi) Nausea And Vomiting  . Latex Rash  . Lyrica [Pregabalin] Swelling and Other (See Comments)    Reaction:  Leg swelling  . Other Other (See Comments)    Pt states that pain medications give her nightmares.    . Plaquenil [Hydroxychloroquine Sulfate] Other (See Comments)    Reaction:  GI upset   . Reglan [Metoclopramide] Other (See Comments)    Reaction:  GI upset   . Requip [Ropinirole Hcl] Other (See Comments)    Reaction:  GI upset   . Septra [Sulfamethoxazole-Trimethoprim] Nausea And Vomiting  . Shellfish Allergy Nausea And Vomiting    Allergies as of 02/10/2020      Reactions   Adrenalone    Lactose Other (See Comments)   abd pain, lactose intolerant   Morphine Other (See Comments)   Sulfa Antibiotics Other (See Comments)   Headache, very sick   Codeine Other (See Comments)   Reaction:  Headaches and nightmares    Lactose Intolerance (gi) Nausea And Vomiting   Latex Rash   Lyrica [pregabalin] Swelling, Other (See Comments)   Reaction:  Leg swelling   Other Other (See Comments)   Pt states that pain medications give her  nightmares.     Plaquenil [hydroxychloroquine Sulfate] Other (See Comments)   Reaction:  GI upset    Reglan [metoclopramide] Other (See Comments)   Reaction:  GI upset    Requip [ropinirole Hcl] Other (See Comments)   Reaction:  GI upset    Septra [sulfamethoxazole-trimethoprim] Nausea And Vomiting   Shellfish Allergy Nausea And Vomiting      Medication List       Accurate as of February 10, 2020  2:34 PM. If you have any questions, ask your nurse or doctor.        STOP taking these medications   denosumab 60 MG/ML Soln injection Commonly known as: PROLIA Stopped by: Marchele Decock X Kimbra Marcelino, NP   mineral oil-hydrophilic petrolatum ointment Stopped by: Brianca Fortenberry X Tatanisha Cuthbert, NP   NONFORMULARY OR COMPOUNDED ITEM Stopped  by: Ariday Brinker X Langston Tuberville, NP   predniSONE 20 MG tablet Commonly known as: DELTASONE Stopped by: Amiree No X Summerlyn Fickel, NP     TAKE these medications   acetaminophen 500 MG tablet Commonly known as: TYLENOL Take 500 mg by mouth. Once A Day - PRN   TYLENOL 500 MG tablet Generic drug: acetaminophen Take 500 mg by mouth 2 (two) times daily.   AeroChamber MV inhaler by Other route. Use as instructed as needed   albuterol 108 (90 Base) MCG/ACT inhaler Commonly known as: VENTOLIN HFA Inhale 2 puffs into the lungs every 6 (six) hours as needed for wheezing or shortness of breath.   aspirin EC 81 MG tablet Take 81 mg by mouth daily.   Calcium 600/Vitamin D3 600-800 MG-UNIT Tabs Generic drug: Calcium Carb-Cholecalciferol Take 1 tablet by mouth daily. What changed: Another medication with the same name was removed. Continue taking this medication, and follow the directions you see here. Changed by: Orabelle Rylee X Daeja Helderman, NP   carbidopa-levodopa 25-100 MG tablet Commonly known as: SINEMET IR Take 2 tablets by mouth in the morning and at bedtime.   docusate sodium 100 MG capsule Commonly known as: COLACE Take 100 mg by mouth at bedtime.   eucerin cream Apply 1 application topically daily.   ezetimibe 10  MG tablet Commonly known as: ZETIA TAKE 1 TABLET ONCE DAILY.   furosemide 20 MG tablet Commonly known as: LASIX Take 20 mg by mouth daily.   lansoprazole 30 MG disintegrating tablet Commonly known as: PREVACID SOLUTAB Take 30 mg by mouth daily at 12 noon.   levothyroxine 50 MCG tablet Commonly known as: SYNTHROID Take 50 mcg by mouth daily before breakfast.   lubiprostone 24 MCG capsule Commonly known as: AMITIZA Take 24 mcg by mouth 2 (two) times daily with a meal.   metoprolol tartrate 25 MG tablet Commonly known as: LOPRESSOR TAKE 1 TABLET ONCE DAILY.   multivitamin with minerals Tabs tablet Take 1 tablet by mouth daily.   nystatin powder Commonly known as: MYCOSTATIN/NYSTOP Apply topically 2 (two) times daily as needed.   potassium chloride 10 MEQ tablet Commonly known as: KLOR-CON Take 10 mEq by mouth daily.   pramipexole 1 MG tablet Commonly known as: MIRAPEX Take 1 mg by mouth 2 (two) times daily.   PreserVision AREDS 2 Chew Chew by mouth. Once a day   QUEtiapine 25 MG tablet Commonly known as: SEROQUEL Take 25 mg by mouth 2 (two) times daily.   rosuvastatin 20 MG tablet Commonly known as: CRESTOR Take 1 tablet (20 mg total) by mouth daily.   senna-docusate 8.6-50 MG tablet Commonly known as: Senokot-S Take 1 tablet by mouth at bedtime.   sertraline 25 MG tablet Commonly known as: ZOLOFT Take 50 mg by mouth daily.   Slow Fe 142 (45 Fe) MG Tbcr Generic drug: Ferrous Sulfate Take 1 tablet by mouth. Daily   Systane 0.4-0.3 % Soln Generic drug: Polyethyl Glycol-Propyl Glycol Apply 1 drop to eye 2 (two) times daily as needed.   Vitamin D 50 MCG (2000 UT) Caps Take 2,000 Units by mouth daily.      ROS was provided with assistance of staff.  Review of Systems  Constitutional: Negative for activity change, appetite change, chills, diaphoresis, fatigue and fever.  HENT: Positive for hearing loss. Negative for congestion and voice change.     Respiratory: Negative for cough, shortness of breath and wheezing.   Cardiovascular: Positive for leg swelling. Negative for chest pain and palpitations.  Gastrointestinal: Negative  for abdominal distention, abdominal pain, constipation, diarrhea, nausea and vomiting.  Genitourinary: Negative for difficulty urinating, dysuria and urgency.  Musculoskeletal: Positive for arthralgias, back pain, gait problem and myalgias.       The right middle finger warmth, swelling, painful when making a fist.   Skin: Negative for color change and pallor.  Neurological: Negative for dizziness, speech difficulty, weakness and headaches.       Memory lapses.   Psychiatric/Behavioral: Positive for confusion and hallucinations. Negative for agitation, behavioral problems, dysphoric mood and sleep disturbance. The patient is not nervous/anxious.     Immunization History  Administered Date(s) Administered  . Influenza Split 09/09/2013  . Influenza, High Dose Seasonal PF 09/12/2018, 09/15/2019  . Influenza,inj,Quad PF,6+ Mos 09/11/2016  . Moderna SARS-COVID-2 Vaccination 12/12/2019, 01/09/2020  . Pneumococcal Conjugate-13 04/15/2014  . Pneumococcal Polysaccharide-23 09/28/2005  . Tdap 09/01/2015  . Zoster Recombinat (Shingrix) 06/11/2018, 09/10/2018   Pertinent  Health Maintenance Due  Topic Date Due  . INFLUENZA VACCINE  Completed  . DEXA SCAN  Completed  . PNA vac Low Risk Adult  Completed   Fall Risk  11/16/2019 04/23/2019 04/09/2019 10/29/2018 10/27/2018  Falls in the past year? 0 0 0 0 1  Comment - - - fell June 2019 -  Number falls in past yr: - 0 0 - 0  Injury with Fall? - 0 0 - 1  Comment - - - - -  Risk for fall due to : - - - - -  Risk for fall due to: Comment - - - - -   Functional Status Survey:    Vitals:   02/10/20 1241  BP: (!) 100/50  Pulse: 70  Resp: 19  Temp: (!) 97.5 F (36.4 C)  SpO2: 95%  Weight: 136 lb 6.4 oz (61.9 kg)  Height: '5\' 2"'  (1.575 m)   Body mass index is  24.95 kg/m. Physical Exam Vitals and nursing note reviewed.  Constitutional:      General: She is not in acute distress.    Appearance: Normal appearance. She is not ill-appearing, toxic-appearing or diaphoretic.  HENT:     Head: Normocephalic and atraumatic.     Mouth/Throat:     Mouth: Mucous membranes are moist.  Eyes:     Extraocular Movements: Extraocular movements intact.     Conjunctiva/sclera: Conjunctivae normal.     Pupils: Pupils are equal, round, and reactive to light.  Cardiovascular:     Rate and Rhythm: Normal rate and regular rhythm.     Heart sounds: Murmur present.  Pulmonary:     Breath sounds: No wheezing, rhonchi or rales.  Abdominal:     General: Bowel sounds are normal. There is no distension.     Palpations: Abdomen is soft.     Tenderness: There is no abdominal tenderness. There is no right CVA tenderness, left CVA tenderness, guarding or rebound.  Musculoskeletal:        General: Swelling and tenderness present.     Cervical back: Normal range of motion and neck supple.     Right lower leg: Edema present.     Left lower leg: Edema present.     Comments: Trace edema BLE.  Arthritic changes in fingers, R>L. The right middle finger warmth, swelling, painful when making a fist.    Skin:    General: Skin is warm and dry.  Neurological:     General: No focal deficit present.     Mental Status: She is alert. Mental status is at  baseline.     Motor: No weakness.     Coordination: Coordination normal.     Gait: Gait abnormal.     Comments: Oriented to person, place.   Psychiatric:        Mood and Affect: Mood normal.        Behavior: Behavior normal.     Labs reviewed: Recent Labs    05/26/19 0906 05/26/19 0906 07/24/19 0000 09/08/19 0000 12/15/19 0000  NA 143  --  142 144 141  K 4.1   < > 4.3 3.5 3.9  CL 110  --  110  --  107  CO2 24  --  24  --  27*  GLUCOSE 97  --   --   --   --   BUN 13  --  '12 10 16  ' CREATININE 0.65  --  0.7 0.7 0.7    CALCIUM 9.0  --  9.0  --  8.6*   < > = values in this interval not displayed.   Recent Labs    07/14/19 0811 07/24/19 0000 09/08/19 0000  AST 21 43* 23  ALT 7 5* 12  ALKPHOS 56 0.4* 46  BILITOT 0.3  --   --   PROT 6.1 5.8  --   ALBUMIN 4.3 3.7  --    Recent Labs    05/26/19 0906 09/08/19 0000 02/03/20 0000  WBC 7.4 7.6 7.8  NEUTROABS 4,862  --  4,485  HGB 9.4* 11.1* 11.3*  HCT 29.8* 34* 34*  MCV 89.5  --   --   PLT 302 245 337   Lab Results  Component Value Date   TSH 1.62 09/08/2019   No results found for: HGBA1C Lab Results  Component Value Date   CHOL 118 07/14/2019   HDL 50 07/14/2019   LDLCALC 32 07/14/2019   TRIG 182 (H) 07/14/2019   CHOLHDL 2.4 07/14/2019    Significant Diagnostic Results in last 30 days:  No results found.  Assessment/Plan Right hand pain 01/29/20 right middle finger swollen, hx of erosive arthritis, 7 day course of Prednisone 37m qd 01/31/20 X-ray right hand 3 views: OA, no acute bony injury.  02/02/20 CRP 1.9 02/10/20 flare up, right middle finger swollen, warmth, tenderness, Prednisone 261mqd x 7 days. CRP, ESR, RAF, CBC/diff, uric acid. Referral to Rheumatology.    Hypertension Blood pressure is controlled, continue Metoprolol.   Parkinson's disease W/c for mobility, not disabling, continue Sinemet, MIraPex.   Chronic back pain Stable, continue Tylenol, w/c, activity as tolerated.      Family/ staff Communication: Plan of care reviewed with the patient and charge nurse.    Labs/tests ordered:  CRP, ESR, RAF, CBC/diff, Uric acid.   Time spend 25 minutes.

## 2020-02-10 NOTE — Assessment & Plan Note (Signed)
Blood pressure is controlled, continue Metoprolol. 

## 2020-02-10 NOTE — Assessment & Plan Note (Signed)
01/29/20 right middle finger swollen, hx of erosive arthritis, 7 day course of Prednisone 20mg qd 01/31/20 X-ray right hand 3 views: OA, no acute bony injury.  02/02/20 CRP 1.9 02/10/20 flare up, right middle finger swollen, warmth, tenderness, Prednisone 20mg qd x 7 days. CRP, ESR, RAF, CBC/diff, uric acid. Referral to Rheumatology.   

## 2020-02-11 DIAGNOSIS — R278 Other lack of coordination: Secondary | ICD-10-CM | POA: Diagnosis not present

## 2020-02-11 DIAGNOSIS — D500 Iron deficiency anemia secondary to blood loss (chronic): Secondary | ICD-10-CM | POA: Diagnosis not present

## 2020-02-11 DIAGNOSIS — M6281 Muscle weakness (generalized): Secondary | ICD-10-CM | POA: Diagnosis not present

## 2020-02-11 DIAGNOSIS — R279 Unspecified lack of coordination: Secondary | ICD-10-CM | POA: Diagnosis not present

## 2020-02-11 DIAGNOSIS — G20 Parkinson's disease: Secondary | ICD-10-CM | POA: Diagnosis not present

## 2020-02-11 DIAGNOSIS — R2681 Unsteadiness on feet: Secondary | ICD-10-CM | POA: Diagnosis not present

## 2020-02-11 DIAGNOSIS — R29898 Other symptoms and signs involving the musculoskeletal system: Secondary | ICD-10-CM | POA: Diagnosis not present

## 2020-02-11 DIAGNOSIS — F060 Psychotic disorder with hallucinations due to known physiological condition: Secondary | ICD-10-CM | POA: Diagnosis not present

## 2020-02-11 NOTE — Progress Notes (Signed)
Cardiology Office Note:    Date:  02/12/2020   ID:  JEREMY SLIGAR, DOB [birth year], MRN [id number]  PCP:  [name], MD  Cardiologist:  Fransico Him, MD    Referring MD: [name], MD   Chief Complaint  Patient presents with  . Congestive Heart Failure  . Hypertension  . Aortic Stenosis  . Hyperlipidemia    History of Present Illness:    [name] is a 86 y.o. female with a hx of chronic SOB secondary to diastolic dysfunction, chronic diastolic CHF, deconditioning and obesity.Aheart cath in 2006 showed normal coronary arteries and normal LVF with normal LVEDP.She also has a history of HTN,mild aortic stenosis on echo 05/2016,chronic LE edema and PVD followed by [name].  She is here today for followup and is doing well.  She has chronic DOE felt multifactorial in the past from CHF, sedentary state with deconditioning and obesity.  She has had a Pulmonary evaluation remotely with normal PFTs.  She has chronic LE edema which is stable.  She denies any chest pain or pressure,  PND, orthopnea,  dizziness, palpitations or syncope. She is compliant with her meds and is tolerating meds with no SE.    Past Medical History:  Diagnosis Date  . Anemia   . Anxiety   . Aortic stenosis    moderate by echo 2021   . Arthritis   . Broken arm    right   . Chronic diastolic CHF (congestive heart failure) ([place])   . Family history of adverse reaction to anesthesia    pt. states sister vomits  . Fibromyalgia   . Fracture of thumb 09/01/2015   right  . Fracture, foot 09/01/2015   right  . GERD (gastroesophageal reflux disease)   . H/O hiatal hernia   . History of bronchitis   . History of kidney stones   . Hypercholesterolemia   . Hypertension    dr t Raliegh Scobie  . Hypothyroidism   . Neuropathy   . Osteoporosis   . Parkinson disease ([place])   . PVD (peripheral vascular disease) ([facility])    99% stenosis of left anteiror tibial artery, mod stenosis of left distal SFA  and popliteal artery followed by [name]  . RBBB    noted on EKG 2018  . Restless leg   . Shortness of breath    with exertion - chronic   . Sjogren's disease ([place])   . SOB (shortness of breath)    chronic due to diastolic dysfunction, deconditioning, obesity  . Tremor     Past Surgical History:  Procedure Laterality Date  . ABDOMINAL HYSTERECTOMY    . BACK SURGERY     4 back surgeries,   lumbar fusion  . CARDIAC CATHETERIZATION  2006   normal  . CYSTOSCOPY/URETEROSCOPY/HOLMIUM LASER/STENT PLACEMENT Left 01/06/2019   Procedure: CYSTOSCOPY/RETROGRADE/URETEROSCOPY/HOLMIUM LASER/BASKET RETRIEVAL/STENT PLACEMENT;  Surgeon: Ceasar Mons, MD;  Location: [facility];  Service: Urology;  Laterality: Left;  . EYE SURGERY Bilateral    cateracts  . falls     variious fall, broken wrist,and toes  . FRACTURE SURGERY Right April 2016   Wrist, Pt. fell  . kidney stone removal Left 01/20/2019  . RIGHT/LEFT HEART CATH AND CORONARY ANGIOGRAPHY N/A 02/05/2018   Procedure: RIGHT/LEFT HEART CATH AND CORONARY ANGIOGRAPHY;  Surgeon: Troy Sine, MD;  Location: [facility];  Service: Cardiovascular;  Laterality: N/A;  . SPINAL CORD STIMULATOR INSERTION N/A 09/09/2015   Procedure: LUMBAR SPINAL  CORD STIMULATOR INSERTION;  Surgeon: Clydell Hakim, MD;  Location: [facility];  Service: Neurosurgery;  Laterality: N/A;  LUMBAR SPINAL CORD STIMULATOR INSERTION  . SPINE SURGERY  April 2013   Back X's 4  . TOTAL HIP ARTHROPLASTY     right  . WRIST FRACTURE SURGERY Bilateral     Current Medications: Current Meds  Medication Sig  . acetaminophen (TYLENOL) 500 MG tablet Take 500 mg by mouth. Once A Day - PRN  . acetaminophen (TYLENOL) 500 MG tablet Take 500 mg by mouth 2 (two) times daily.  Marland Kitchen albuterol (PROVENTIL HFA;VENTOLIN HFA) 108 (90 Base) MCG/ACT inhaler Inhale 2 puffs into the lungs every 6 (six) hours as needed for wheezing or shortness of breath.  Marland Kitchen aspirin EC 81 MG tablet Take  81 mg by mouth daily.   . Calcium Carb-Cholecalciferol (CALCIUM 600/VITAMIN D3) 600-800 MG-UNIT TABS Take 1 tablet by mouth daily.  . carbidopa-levodopa (SINEMET IR) 25-100 MG tablet Take 2 tablets by mouth in the morning and at bedtime.   . Cholecalciferol (VITAMIN D) 2000 units CAPS Take 2,000 Units by mouth daily.  Marland Kitchen docusate sodium (COLACE) 100 MG capsule Take 100 mg by mouth at bedtime.  Marland Kitchen ezetimibe (ZETIA) 10 MG tablet TAKE 1 TABLET ONCE DAILY.  Marland Kitchen Ferrous Sulfate (SLOW FE) 142 (45 Fe) MG TBCR Take 1 tablet by mouth. Daily  . furosemide (LASIX) 20 MG tablet Take 20 mg by mouth daily.  . lansoprazole (PREVACID SOLUTAB) 30 MG disintegrating tablet Take 30 mg by mouth daily at 12 noon.  Marland Kitchen levothyroxine (SYNTHROID, LEVOTHROID) 50 MCG tablet Take 50 mcg by mouth daily before breakfast.   . lubiprostone (AMITIZA) 24 MCG capsule Take 24 mcg by mouth 2 (two) times daily with a meal.   . metoprolol tartrate (LOPRESSOR) 25 MG tablet TAKE 1 TABLET ONCE DAILY.  . Multiple Vitamin (MULTIVITAMIN WITH MINERALS) TABS tablet Take 1 tablet by mouth daily.  . Multiple Vitamins-Minerals (PRESERVISION AREDS 2) CHEW Chew by mouth. Once a day  . nystatin (MYCOSTATIN/NYSTOP) powder Apply topically 2 (two) times daily as needed.  Vladimir Faster Glycol-Propyl Glycol (SYSTANE) 0.4-0.3 % SOLN Apply 1 drop to eye 2 (two) times daily as needed.  . potassium chloride (KLOR-CON) 10 MEQ tablet Take 10 mEq by mouth daily.  . pramipexole (MIRAPEX) 1 MG tablet Take 1 mg by mouth 2 (two) times daily.  . predniSONE (DELTASONE) 20 MG tablet Take 20 mg by mouth daily.  . QUEtiapine (SEROQUEL) 25 MG tablet Take 25 mg by mouth 2 (two) times daily.   . rosuvastatin (CRESTOR) 20 MG tablet Take 1 tablet (20 mg total) by mouth daily.  Marland Kitchen senna-docusate (SENOKOT-S) 8.6-50 MG tablet Take 1 tablet by mouth at bedtime.   . Skin Protectants, Misc. (EUCERIN) cream Apply 1 application topically daily.   Marland Kitchen Spacer/Aero-Holding Chambers  (AEROCHAMBER MV) inhaler by Other route. Use as instructed as needed     Allergies:   Adrenalone, Lactose, Morphine, Sulfa antibiotics, Codeine, Lactose intolerance (gi), Latex, Lyrica [pregabalin], Other, Plaquenil [hydroxychloroquine sulfate], Reglan [metoclopramide], Requip [ropinirole hcl], Septra [sulfamethoxazole-trimethoprim], and Shellfish allergy   Social History   Socioeconomic History  . Marital status: Married    Spouse name: [name]  . Number of children: 2  . Years of education: HS  . Highest education level: Not on file  Occupational History    Comment: Homemaker  Tobacco Use  . Smoking status: Never Smoker  . Smokeless tobacco: Never Used  Substance and Sexual Activity  .  Alcohol use: No    Alcohol/week: 0.0 standard drinks  . Drug use: No  . Sexual activity: Never  Other Topics Concern  . Not on file  Social History Narrative   Patient lives at home with her spouse. Married in 1957. Two people lives in the home, no pets.    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    Caffeine Use: tea   Social Determinants of Health   Financial Resource Strain:   . Difficulty of Paying Living Expenses: Not on file  Food Insecurity:   . Worried About Charity fundraiser in the Last Year: Not on file  . Ran Out of Food in the Last Year: Not on file  Transportation Needs:   . Lack of Transportation (Medical): Not on file  . Lack of Transportation (Non-Medical): Not on file  Physical Activity:   . Days of Exercise per Week: Not on file  . Minutes of Exercise per Session: Not on file  Stress:   . Feeling of Stress : Not on file  Social Connections:   . Frequency of Communication with Friends and Family: Not on file  . Frequency of Social Gatherings with Friends and Family: Not on file  . Attends Religious Services: Not on file  . Active Member of Clubs or Organizations: Not on file  . Attends Archivist  Meetings: Not on file  . Marital Status: Not on file     Family History: The patient's family history includes COPD in her father; Heart attack in her mother; Heart disease in her mother; Heart failure in her mother; Hypertension in her mother; Restless legs syndrome in her mother.  ROS:   Please see the history of present illness.    ROS  All other systems reviewed and negative.   EKGs/Labs/Other Studies Reviewed:    The following studies were reviewed today: EKG, prior OV notes  EKG:  EKG is  ordered today.  The ekg ordered today demonstrates NSR with RBBB  Recent Labs: 09/08/2019: ALT 12; TSH 1.62 12/15/2019: BUN 16; Creatinine 0.7; Potassium 3.9; Sodium 141 02/03/2020: Hemoglobin 11.3; Platelets 337   Recent Lipid Panel    Component Value Date/Time   CHOL 118 07/14/2019 0811   TRIG 182 (H) 07/14/2019 0811   HDL 50 07/14/2019 0811   CHOLHDL 2.4 07/14/2019 0811   CHOLHDL 2.7 07/01/2018 0715   LDLCALC 32 07/14/2019 0811   LDLCALC 77 07/01/2018 0715    Physical Exam:    VS:  BP (!) 110/52   Pulse 67   Ht 5\' 2"  (1.575 m)   Wt 141 lb (64 kg)   BMI 25.79 kg/m     Wt Readings from Last 3 Encounters:  02/12/20 141 lb (64 kg)  02/10/20 136 lb 6.4 oz (61.9 kg)  02/05/20 134 lb 6.4 oz (61 kg)     GEN:  Well nourished, well developed in no acute distress HEENT: Normal NECK: No JVD; No carotid bruits LYMPHATICS: No lymphadenopathy CARDIAC: RRR, no  rubs, gallops.  2/6 mid peaking systolic murmur at the RUSB to LLSB RESPIRATORY:  Clear to auscultation without rales, wheezing or rhonchi  ABDOMEN: Soft, non-tender, non-distended MUSCULOSKELETAL: trace pedal  edema; No deformity  SKIN: Warm and dry NEUROLOGIC:  Alert and oriented x 3 PSYCHIATRIC:  Normal affect   ASSESSMENT:    1. Nonrheumatic aortic valve stenosis   2. Chronic diastolic CHF (congestive heart failure) ([place])  3. Essential hypertension   4. PVD (peripheral vascular disease) ([place])   5.  Hypercholesterolemia    PLAN:    In order of problems listed above:  1  Chronic diastolic CHF  -she appears euvolemic on exam today -She complains of chronic LE edema by the end of the day - she is very sedentary due to progression of Parkinson's so her LE edema is due to chronic dependent edema from sitting all day -encouraged her to wear compression hose during the day to help with edema although her edema is minimal today on her feet -continue Lasix 20mg  daily -cath in 2019 showed mild nonobstructive disease and normal right heart pressures  2.  HTN  -BP well controlled on exam today -continue Lopressor 25mg  BID  3.  PVD  - s/p 99% stenosis of left anteiror tibial artery, mod stenosis of left distal SFA and popliteal artery followed by [name].   -continue statin  4.  Aortic stenosis -2D echo 12/2019 showed normal LVF with EF 55-60% with mild LVH, G2DD and moderate AS -mean AVG had increased in a year from 22>59mmHg. -repeat echo in 1 year  5.  Hyperlipidemia  - LDL goal < 70 given PVD.   -continue Zetia 10mg  daily and lovastatin 40mg  daily -LDL was 32 in Aug 2020  Medication Adjustments/Labs and Tests Ordered: Current medicines are reviewed at length with the patient today.  Concerns regarding medicines are outlined above.  Orders Placed This Encounter  Procedures  . EKG 12-Lead  . ECHOCARDIOGRAM COMPLETE   No orders of the defined types were placed in this encounter.   Signed, Fransico Him, MD  02/12/2020 11:04 AM    [place]

## 2020-02-12 ENCOUNTER — Other Ambulatory Visit: Payer: Self-pay

## 2020-02-12 ENCOUNTER — Encounter: Payer: Self-pay | Admitting: Cardiology

## 2020-02-12 ENCOUNTER — Ambulatory Visit (INDEPENDENT_AMBULATORY_CARE_PROVIDER_SITE_OTHER): Payer: Medicare Other | Admitting: Cardiology

## 2020-02-12 VITALS — BP 110/52 | HR 67 | Ht 62.0 in | Wt 141.0 lb

## 2020-02-12 DIAGNOSIS — I10 Essential (primary) hypertension: Secondary | ICD-10-CM | POA: Diagnosis not present

## 2020-02-12 DIAGNOSIS — I350 Nonrheumatic aortic (valve) stenosis: Secondary | ICD-10-CM

## 2020-02-12 DIAGNOSIS — I739 Peripheral vascular disease, unspecified: Secondary | ICD-10-CM

## 2020-02-12 DIAGNOSIS — I5032 Chronic diastolic (congestive) heart failure: Secondary | ICD-10-CM

## 2020-02-12 DIAGNOSIS — E7800 Pure hypercholesterolemia, unspecified: Secondary | ICD-10-CM | POA: Diagnosis not present

## 2020-02-12 LAB — CBC AND DIFFERENTIAL
HCT: 33 — AB (ref 36–46)
Hemoglobin: 11 — AB (ref 12.0–16.0)
Neutrophils Absolute: 4845
WBC: 7.5

## 2020-02-12 LAB — CBC: RBC: 3.57 — AB (ref 3.87–5.11)

## 2020-02-12 LAB — POCT ERYTHROCYTE SEDIMENTATION RATE, NON-AUTOMATED: Sed Rate: 25

## 2020-02-12 NOTE — Patient Instructions (Signed)
Medication Instructions:  Your physician recommends that you continue on your current medications as directed. Please refer to the Current Medication list given to you today.  *If you need a refill on your cardiac medications before your next appointment, please call your pharmacy*   Testing/Procedures: Your physician has requested that you have an echocardiogram in January 2022. Echocardiography is a painless test that uses sound waves to create images of your heart. It provides your doctor with information about the size and shape of your heart and how well your heart's chambers and valves are working. This procedure takes approximately one hour. There are no restrictions for this procedure.   Follow-Up: At [facility], you and your health needs are our priority.  As part of our continuing mission to provide you with exceptional heart care, we have created designated Provider Care Teams.  These Care Teams include your primary Cardiologist (physician) and Advanced Practice Providers (APPs -  Physician Assistants and Nurse Practitioners) who all work together to provide you with the care you need, when you need it.  We recommend signing up for the patient portal called "MyChart".  Sign up information is provided on this After Visit Summary.  MyChart is used to connect with patients for Virtual Visits (Telemedicine).  Patients are able to view lab/test results, encounter notes, upcoming appointments, etc.  Non-urgent messages can be sent to your provider as well.   To learn more about what you can do with MyChart, go to [email or web address].    Your next appointment:   6 month(s)  The format for your next appointment:   In Person  Provider:   Melina Copa, PA-C or Ermalinda Barrios, PA-C   Other Instructions Follow up with Dr. Radford Pax in 1 year.

## 2020-02-15 DIAGNOSIS — D500 Iron deficiency anemia secondary to blood loss (chronic): Secondary | ICD-10-CM | POA: Diagnosis not present

## 2020-02-15 DIAGNOSIS — R2681 Unsteadiness on feet: Secondary | ICD-10-CM | POA: Diagnosis not present

## 2020-02-15 DIAGNOSIS — G20 Parkinson's disease: Secondary | ICD-10-CM | POA: Diagnosis not present

## 2020-02-15 DIAGNOSIS — M6281 Muscle weakness (generalized): Secondary | ICD-10-CM | POA: Diagnosis not present

## 2020-02-15 DIAGNOSIS — R278 Other lack of coordination: Secondary | ICD-10-CM | POA: Diagnosis not present

## 2020-02-15 DIAGNOSIS — R279 Unspecified lack of coordination: Secondary | ICD-10-CM | POA: Diagnosis not present

## 2020-02-16 DIAGNOSIS — M6281 Muscle weakness (generalized): Secondary | ICD-10-CM | POA: Diagnosis not present

## 2020-02-16 DIAGNOSIS — R279 Unspecified lack of coordination: Secondary | ICD-10-CM | POA: Diagnosis not present

## 2020-02-16 DIAGNOSIS — G20 Parkinson's disease: Secondary | ICD-10-CM | POA: Diagnosis not present

## 2020-02-16 DIAGNOSIS — D500 Iron deficiency anemia secondary to blood loss (chronic): Secondary | ICD-10-CM | POA: Diagnosis not present

## 2020-02-16 DIAGNOSIS — R278 Other lack of coordination: Secondary | ICD-10-CM | POA: Diagnosis not present

## 2020-02-16 DIAGNOSIS — R2681 Unsteadiness on feet: Secondary | ICD-10-CM | POA: Diagnosis not present

## 2020-02-17 DIAGNOSIS — Z20828 Contact with and (suspected) exposure to other viral communicable diseases: Secondary | ICD-10-CM | POA: Diagnosis not present

## 2020-02-18 DIAGNOSIS — M2550 Pain in unspecified joint: Secondary | ICD-10-CM | POA: Diagnosis not present

## 2020-02-18 DIAGNOSIS — N200 Calculus of kidney: Secondary | ICD-10-CM | POA: Diagnosis not present

## 2020-02-18 DIAGNOSIS — M6281 Muscle weakness (generalized): Secondary | ICD-10-CM | POA: Diagnosis not present

## 2020-02-18 DIAGNOSIS — R278 Other lack of coordination: Secondary | ICD-10-CM | POA: Diagnosis not present

## 2020-02-18 DIAGNOSIS — E611 Iron deficiency: Secondary | ICD-10-CM | POA: Diagnosis not present

## 2020-02-18 DIAGNOSIS — M810 Age-related osteoporosis without current pathological fracture: Secondary | ICD-10-CM | POA: Diagnosis not present

## 2020-02-18 DIAGNOSIS — G20 Parkinson's disease: Secondary | ICD-10-CM | POA: Diagnosis not present

## 2020-02-18 DIAGNOSIS — Z79899 Other long term (current) drug therapy: Secondary | ICD-10-CM | POA: Diagnosis not present

## 2020-02-18 DIAGNOSIS — M154 Erosive (osteo)arthritis: Secondary | ICD-10-CM | POA: Diagnosis not present

## 2020-02-18 DIAGNOSIS — D500 Iron deficiency anemia secondary to blood loss (chronic): Secondary | ICD-10-CM | POA: Diagnosis not present

## 2020-02-18 DIAGNOSIS — R279 Unspecified lack of coordination: Secondary | ICD-10-CM | POA: Diagnosis not present

## 2020-02-18 DIAGNOSIS — R2681 Unsteadiness on feet: Secondary | ICD-10-CM | POA: Diagnosis not present

## 2020-02-18 DIAGNOSIS — M3500 Sicca syndrome, unspecified: Secondary | ICD-10-CM | POA: Diagnosis not present

## 2020-02-18 DIAGNOSIS — M25441 Effusion, right hand: Secondary | ICD-10-CM | POA: Diagnosis not present

## 2020-02-18 DIAGNOSIS — M545 Low back pain: Secondary | ICD-10-CM | POA: Diagnosis not present

## 2020-02-23 DIAGNOSIS — D500 Iron deficiency anemia secondary to blood loss (chronic): Secondary | ICD-10-CM | POA: Diagnosis not present

## 2020-02-23 DIAGNOSIS — R279 Unspecified lack of coordination: Secondary | ICD-10-CM | POA: Diagnosis not present

## 2020-02-23 DIAGNOSIS — R278 Other lack of coordination: Secondary | ICD-10-CM | POA: Diagnosis not present

## 2020-02-23 DIAGNOSIS — R2681 Unsteadiness on feet: Secondary | ICD-10-CM | POA: Diagnosis not present

## 2020-02-23 DIAGNOSIS — M6281 Muscle weakness (generalized): Secondary | ICD-10-CM | POA: Diagnosis not present

## 2020-02-23 DIAGNOSIS — G20 Parkinson's disease: Secondary | ICD-10-CM | POA: Diagnosis not present

## 2020-02-24 DIAGNOSIS — D500 Iron deficiency anemia secondary to blood loss (chronic): Secondary | ICD-10-CM | POA: Diagnosis not present

## 2020-02-24 DIAGNOSIS — R279 Unspecified lack of coordination: Secondary | ICD-10-CM | POA: Diagnosis not present

## 2020-02-24 DIAGNOSIS — R278 Other lack of coordination: Secondary | ICD-10-CM | POA: Diagnosis not present

## 2020-02-24 DIAGNOSIS — M6281 Muscle weakness (generalized): Secondary | ICD-10-CM | POA: Diagnosis not present

## 2020-02-24 DIAGNOSIS — G20 Parkinson's disease: Secondary | ICD-10-CM | POA: Diagnosis not present

## 2020-02-24 DIAGNOSIS — R2681 Unsteadiness on feet: Secondary | ICD-10-CM | POA: Diagnosis not present

## 2020-02-24 DIAGNOSIS — Z20828 Contact with and (suspected) exposure to other viral communicable diseases: Secondary | ICD-10-CM | POA: Diagnosis not present

## 2020-02-25 DIAGNOSIS — D500 Iron deficiency anemia secondary to blood loss (chronic): Secondary | ICD-10-CM | POA: Diagnosis not present

## 2020-02-25 DIAGNOSIS — R279 Unspecified lack of coordination: Secondary | ICD-10-CM | POA: Diagnosis not present

## 2020-02-25 DIAGNOSIS — R2681 Unsteadiness on feet: Secondary | ICD-10-CM | POA: Diagnosis not present

## 2020-02-25 DIAGNOSIS — G20 Parkinson's disease: Secondary | ICD-10-CM | POA: Diagnosis not present

## 2020-02-25 DIAGNOSIS — R278 Other lack of coordination: Secondary | ICD-10-CM | POA: Diagnosis not present

## 2020-02-25 DIAGNOSIS — M6281 Muscle weakness (generalized): Secondary | ICD-10-CM | POA: Diagnosis not present

## 2020-02-26 DIAGNOSIS — G20 Parkinson's disease: Secondary | ICD-10-CM | POA: Diagnosis not present

## 2020-02-26 DIAGNOSIS — R279 Unspecified lack of coordination: Secondary | ICD-10-CM | POA: Diagnosis not present

## 2020-02-26 DIAGNOSIS — D500 Iron deficiency anemia secondary to blood loss (chronic): Secondary | ICD-10-CM | POA: Diagnosis not present

## 2020-02-26 DIAGNOSIS — R2681 Unsteadiness on feet: Secondary | ICD-10-CM | POA: Diagnosis not present

## 2020-02-26 DIAGNOSIS — R278 Other lack of coordination: Secondary | ICD-10-CM | POA: Diagnosis not present

## 2020-02-26 DIAGNOSIS — M6281 Muscle weakness (generalized): Secondary | ICD-10-CM | POA: Diagnosis not present

## 2020-03-01 ENCOUNTER — Encounter: Payer: Self-pay | Admitting: Internal Medicine

## 2020-03-01 ENCOUNTER — Non-Acute Institutional Stay (SKILLED_NURSING_FACILITY): Payer: Medicare Other | Admitting: Internal Medicine

## 2020-03-01 DIAGNOSIS — I10 Essential (primary) hypertension: Secondary | ICD-10-CM

## 2020-03-01 DIAGNOSIS — G20 Parkinson's disease: Secondary | ICD-10-CM

## 2020-03-01 DIAGNOSIS — M154 Erosive (osteo)arthritis: Secondary | ICD-10-CM

## 2020-03-01 DIAGNOSIS — R279 Unspecified lack of coordination: Secondary | ICD-10-CM | POA: Diagnosis not present

## 2020-03-01 DIAGNOSIS — I5032 Chronic diastolic (congestive) heart failure: Secondary | ICD-10-CM

## 2020-03-01 DIAGNOSIS — R441 Visual hallucinations: Secondary | ICD-10-CM

## 2020-03-01 DIAGNOSIS — M79674 Pain in right toe(s): Secondary | ICD-10-CM | POA: Diagnosis not present

## 2020-03-01 DIAGNOSIS — R278 Other lack of coordination: Secondary | ICD-10-CM | POA: Diagnosis not present

## 2020-03-01 DIAGNOSIS — R2681 Unsteadiness on feet: Secondary | ICD-10-CM | POA: Diagnosis not present

## 2020-03-01 DIAGNOSIS — D500 Iron deficiency anemia secondary to blood loss (chronic): Secondary | ICD-10-CM | POA: Diagnosis not present

## 2020-03-01 DIAGNOSIS — M6281 Muscle weakness (generalized): Secondary | ICD-10-CM | POA: Diagnosis not present

## 2020-03-01 LAB — URIC ACID
CRP: 5.1
Uric Acid: 4.3

## 2020-03-01 LAB — RHEUMATOID FACTOR: Rheumatoid Factor (IgA): <14

## 2020-03-01 NOTE — Progress Notes (Signed)
Location:   [facility] Room Number: 3 Place of Service:  SNF [phone]) Provider:  [name], MD  [name], MD  Patient Care Team: [name], MD as PCP - General (Internal Medicine) [name], MD as PCP - Cardiology (Cardiology) [name], MD (Neurosurgery) [name], MD (Neurology) [name], MD (Inactive) as Consulting Physician (Gastroenterology) [name], MD as Consulting Physician (Urology) [name], Man X, NP as Nurse Practitioner (Internal Medicine)  Extended Emergency Contact Information Primary Emergency Contact: [facility] Address: [address]          [place], [place] [address] [name] of [place] Phone: [phone] Mobile Phone: [phone] Relation: Spouse Secondary Emergency Contact: [name] Mobile Phone: [phone] Relation: Daughter Preferred language: [name] Interpreter needed? No  Code Status:  Full Code Goals of care: Advanced Directive information Advanced Directives 03/01/2020  Does Patient Have a Medical Advance Directive? Yes  Type of Advance Directive Living will;Out of facility DNR (pink MOST or yellow form)  Does patient want to make changes to medical advance directive? No - Patient declined  Copy of [place] in Chart? -  Would patient like information on creating a medical advance directive? -  Pre-existing out of facility DNR order (yellow form or pink MOST form) Yellow form placed in chart (order not valid for inpatient use)     Chief Complaint  Patient presents with  . Acute Visit    Right toe pain    HPI:  Pt is a 86 y.o. female seen today for an acute visit for Right Great Toe Pain, Depression , Parkinson Disease and her Arthritis  Patient has history of Parkinson disease with visual hallucinations, peripheral neuropathy, hypothyroid, GERD, osteoporosis, hyperlipidemia, gout, Erosive Arthritis dyspnea on  exertionand Aortic stenosis  Patient was seen today as she had acute complaint of right toe pain. There is no swelling or redness in the toe. Patient has a history of erosive arthritis and was recently seen by rheumatology and was started on Plaquenil back. She also has history of Parkinson and is tolerating Sinemet increased dose.  Her hallucinations seems to be stable on Seroquel.  Her mood is also better today. She has gained almost 10 pounds due to improved appetite.  Past Medical History:  Diagnosis Date  . Anemia   . Anxiety   . Aortic stenosis    moderate by echo 2021   . Arthritis   . Broken arm    right   . Chronic diastolic CHF (congestive heart failure) ([place])   . Family history of adverse reaction to anesthesia    pt. states sister vomits  . Fibromyalgia   . Fracture of thumb 09/01/2015   right  . Fracture, foot 09/01/2015   right  . GERD (gastroesophageal reflux disease)   . H/O hiatal hernia   . History of bronchitis   . History of kidney stones   . Hypercholesterolemia   . Hypertension    dr t turner  . Hypothyroidism   . Neuropathy   . Osteoporosis   . Parkinson disease ([place])   . PVD (peripheral vascular disease) ([facility])    99% stenosis of left anteiror tibial artery, mod stenosis of left distal SFA and popliteal artery followed by [name]  . RBBB    noted on EKG 2018  . Restless leg   . Shortness of breath    with exertion - chronic   . Sjogren's disease ([facility])   .  SOB (shortness of breath)    chronic due to diastolic dysfunction, deconditioning, obesity  . Tremor    Past Surgical History:  Procedure Laterality Date  . ABDOMINAL HYSTERECTOMY    . BACK SURGERY     4 back surgeries,   lumbar fusion  . CARDIAC CATHETERIZATION  2006   normal  . CYSTOSCOPY/URETEROSCOPY/HOLMIUM LASER/STENT PLACEMENT Left 01/06/2019   Procedure: CYSTOSCOPY/RETROGRADE/URETEROSCOPY/HOLMIUM LASER/BASKET RETRIEVAL/STENT PLACEMENT;  Surgeon: Ceasar Mons, MD;   Location: [facility];  Service: Urology;  Laterality: Left;  . EYE SURGERY Bilateral    cateracts  . falls     variious fall, broken wrist,and toes  . FRACTURE SURGERY Right April 2016   Wrist, Pt. fell  . kidney stone removal Left 01/20/2019  . RIGHT/LEFT HEART CATH AND CORONARY ANGIOGRAPHY N/A 02/05/2018   Procedure: RIGHT/LEFT HEART CATH AND CORONARY ANGIOGRAPHY;  Surgeon: Troy Sine, MD;  Location: [facility];  Service: Cardiovascular;  Laterality: N/A;  . SPINAL CORD STIMULATOR INSERTION N/A 09/09/2015   Procedure: LUMBAR SPINAL CORD STIMULATOR INSERTION;  Surgeon: Clydell Hakim, MD;  Location: [facility];  Service: Neurosurgery;  Laterality: N/A;  LUMBAR SPINAL CORD STIMULATOR INSERTION  . SPINE SURGERY  April 2013   Back X's 4  . TOTAL HIP ARTHROPLASTY     right  . WRIST FRACTURE SURGERY Bilateral     Allergies  Allergen Reactions  . Adrenalone   . Lactose Other (See Comments)    abd pain, lactose intolerant  . Morphine Other (See Comments)  . Sulfa Antibiotics Other (See Comments)    Headache, very sick  . Codeine Other (See Comments)    Reaction:  Headaches and nightmares   . Lactose Intolerance (Gi) Nausea And Vomiting  . Latex Rash  . Lyrica [Pregabalin] Swelling and Other (See Comments)    Reaction:  Leg swelling  . Other Other (See Comments)    Pt states that pain medications give her nightmares.    . Plaquenil [Hydroxychloroquine Sulfate] Other (See Comments)    Reaction:  GI upset   . Reglan [Metoclopramide] Other (See Comments)    Reaction:  GI upset   . Requip [Ropinirole Hcl] Other (See Comments)    Reaction:  GI upset   . Septra [Sulfamethoxazole-Trimethoprim] Nausea And Vomiting  . Shellfish Allergy Nausea And Vomiting    Allergies as of 03/01/2020      Reactions   Adrenalone    Lactose Other (See Comments)   abd pain, lactose intolerant   Morphine Other (See Comments)   Sulfa Antibiotics Other (See Comments)   Headache, very sick    Codeine Other (See Comments)   Reaction:  Headaches and nightmares    Lactose Intolerance (gi) Nausea And Vomiting   Latex Rash   Lyrica [pregabalin] Swelling, Other (See Comments)   Reaction:  Leg swelling   Other Other (See Comments)   Pt states that pain medications give her nightmares.     Plaquenil [hydroxychloroquine Sulfate] Other (See Comments)   Reaction:  GI upset    Reglan [metoclopramide] Other (See Comments)   Reaction:  GI upset    Requip [ropinirole Hcl] Other (See Comments)   Reaction:  GI upset    Septra [sulfamethoxazole-trimethoprim] Nausea And Vomiting   Shellfish Allergy Nausea And Vomiting      Medication List       Accurate as of March 01, 2020 10:46 AM. If you have any questions, ask your nurse or doctor.  acetaminophen 500 MG tablet Commonly known as: TYLENOL Take 500 mg by mouth. Once A Day - PRN   TYLENOL 500 MG tablet Generic drug: acetaminophen Take 500 mg by mouth 2 (two) times daily.   AeroChamber MV inhaler by Other route. Use as instructed as needed   albuterol 108 (90 Base) MCG/ACT inhaler Commonly known as: VENTOLIN HFA Inhale 2 puffs into the lungs every 6 (six) hours as needed for wheezing or shortness of breath.   aspirin EC 81 MG tablet Take 81 mg by mouth daily.   Calcium 600/Vitamin D3 600-800 MG-UNIT Tabs Generic drug: Calcium Carb-Cholecalciferol Take 1 tablet by mouth daily.   carbidopa-levodopa 25-100 MG tablet Commonly known as: SINEMET IR Take 2 tablets by mouth in the morning and at bedtime.   docusate sodium 100 MG capsule Commonly known as: COLACE Take 100 mg by mouth at bedtime.   eucerin cream Apply 1 application topically daily.   ezetimibe 10 MG tablet Commonly known as: ZETIA TAKE 1 TABLET ONCE DAILY.   furosemide 20 MG tablet Commonly known as: LASIX Take 20 mg by mouth daily.   hydroxychloroquine 200 MG tablet Commonly known as: PLAQUENIL Take 200 mg by mouth daily.   ibuprofen 800 MG  tablet Commonly known as: ADVIL Take 800 mg by mouth every 8 (eight) hours as needed.   lansoprazole 30 MG disintegrating tablet Commonly known as: PREVACID SOLUTAB Take 30 mg by mouth daily at 12 noon.   levothyroxine 50 MCG tablet Commonly known as: SYNTHROID Take 50 mcg by mouth daily before breakfast.   lubiprostone 24 MCG capsule Commonly known as: AMITIZA Take 24 mcg by mouth 2 (two) times daily with a meal.   metoprolol tartrate 25 MG tablet Commonly known as: LOPRESSOR TAKE 1 TABLET ONCE DAILY.   multivitamin with minerals Tabs tablet Take 1 tablet by mouth daily.   nystatin powder Commonly known as: MYCOSTATIN/NYSTOP Apply topically 2 (two) times daily as needed.   potassium chloride 10 MEQ tablet Commonly known as: KLOR-CON Take 10 mEq by mouth daily.   pramipexole 1 MG tablet Commonly known as: MIRAPEX Take 1 mg by mouth 2 (two) times daily.   predniSONE 20 MG tablet Commonly known as: DELTASONE Take 20 mg by mouth daily.   PreserVision AREDS 2 Chew Chew by mouth. Once a day   QUEtiapine 25 MG tablet Commonly known as: SEROQUEL Take 25 mg by mouth 2 (two) times daily.   rosuvastatin 20 MG tablet Commonly known as: CRESTOR Take 1 tablet (20 mg total) by mouth daily.   senna-docusate 8.6-50 MG tablet Commonly known as: Senokot-S Take 1 tablet by mouth at bedtime.   sertraline 25 MG tablet Commonly known as: ZOLOFT Take 75 mg by mouth at bedtime. A TAB AND A HALF TO EQUAL 75MG    Slow Fe 142 (45 Fe) MG Tbcr Generic drug: Ferrous Sulfate Take 1 tablet by mouth. Daily   Systane 0.4-0.3 % Soln Generic drug: Polyethyl Glycol-Propyl Glycol Apply 1 drop to eye 2 (two) times daily as needed.   Vitamin D 50 MCG (2000 UT) Caps Take 2,000 Units by mouth daily.       Review of Systems  Constitutional: Negative.   HENT: Negative.   Respiratory: Negative.   Cardiovascular: Positive for leg swelling.  Gastrointestinal: Negative.   Genitourinary:  Negative.   Musculoskeletal: Positive for arthralgias, gait problem and myalgias.  Skin: Negative.   Neurological: Positive for weakness.  Psychiatric/Behavioral: Positive for behavioral problems, confusion and dysphoric mood.  Immunization History  Administered Date(s) Administered  . Influenza Split 09/09/2013  . Influenza, High Dose Seasonal PF 09/12/2018, 09/15/2019  . Influenza,inj,Quad PF,6+ Mos 09/11/2016  . Moderna SARS-COVID-2 Vaccination 12/12/2019, 01/09/2020  . Pneumococcal Conjugate-13 04/15/2014  . Pneumococcal Polysaccharide-23 09/28/2005  . Tdap 09/01/2015  . Zoster Recombinat (Shingrix) 06/11/2018, 09/10/2018   Pertinent  Health Maintenance Due  Topic Date Due  . INFLUENZA VACCINE  Completed  . DEXA SCAN  Completed  . PNA vac Low Risk Adult  Completed   Fall Risk  11/16/2019 04/23/2019 04/09/2019 10/29/2018 10/27/2018  Falls in the past year? 0 0 0 0 1  Comment - - - fell June 2019 -  Number falls in past yr: - 0 0 - 0  Injury with Fall? - 0 0 - 1  Comment - - - - -  Risk for fall due to : - - - - -  Risk for fall due to: Comment - - - - -   Functional Status Survey:    Vitals:   03/01/20 1036  BP: 110/60  Pulse: 67  Resp: 19  Temp: (!) 97.3 F (36.3 C)  SpO2: 99%  Weight: 146 lb 6.4 oz (66.4 kg)  Height: 5\' 2"  (1.575 m)   Body mass index is 26.78 kg/m. Physical Exam  Constitutional: Oriented to person, place, and time. Well-developed and well-nourished.  HENT:  Head: Normocephalic.  Mouth/Throat: Oropharynx is clear and moist.  Eyes: Pupils are equal, round, and reactive to light.  Neck: Neck supple.  Cardiovascular: Normal rate and normal heart sounds.  Positive for murmur Pulmonary/Chest: Effort normal and breath sounds normal. No respiratory distress. No wheezes. She has no rales.  Abdominal: Soft. Bowel sounds are normal. No distension. There is no tenderness. There is no rebound.  Musculoskeletal: Trace edema bilateral  Right Toe is  not Swollen or red   Lymphadenopathy: none Neurological: Alert and oriented to person, place, and time.  Skin: Skin is warm and dry.  Psychiatric: Normal mood and affect. Behavior is normal. Thought content normal.    Labs reviewed: Recent Labs    05/26/19 0906 05/26/19 0906 07/24/19 0000 09/08/19 0000 12/15/19 0000  NA 143  --  142 144 141  K 4.1   < > 4.3 3.5 3.9  CL 110  --  110  --  107  CO2 24  --  24  --  27*  GLUCOSE 97  --   --   --   --   BUN 13  --  12 10 16   CREATININE 0.65  --  0.7 0.7 0.7  CALCIUM 9.0  --  9.0  --  8.6*   < > = values in this interval not displayed.   Recent Labs    07/14/19 0811 07/24/19 0000 09/08/19 0000  AST 21 43* 23  ALT 7 5* 12  ALKPHOS 56 0.4* 46  BILITOT 0.3  --   --   PROT 6.1 5.8  --   ALBUMIN 4.3 3.7  --    Recent Labs    05/26/19 0906 05/26/19 0906 09/08/19 0000 02/03/20 0000 02/12/20 0000  WBC 7.4  --  7.6 7.8 7.5  NEUTROABS 4,862  --   --  4,485 4,845  HGB 9.4*   < > 11.1* 11.3* 11.0*  HCT 29.8*   < > 34* 34* 33*  MCV 89.5  --   --   --   --   PLT 302  --  245 337  --    < > =  values in this interval not displayed.   Lab Results  Component Value Date   TSH 1.62 09/08/2019   No results found for: HGBA1C Lab Results  Component Value Date   CHOL 118 07/14/2019   HDL 50 07/14/2019   LDLCALC 32 07/14/2019   TRIG 182 (H) 07/14/2019   CHOLHDL 2.4 07/14/2019    Significant Diagnostic Results in last 30 days:  No results found.  Assessment/Plan Great toe pain, right Exam is Benign Will Continue to monitor  Erosive (osteo)arthritis Recently started on Plaquenil by Rheumatologist  Parkinson's disease ([place]) On Sinemet and Mirapex Chronic diastolic CHF (congestive heart failure) ([facility]) Edema Improv Continue on Lasix BUN and Creat Stable Hallucinations, visual is doing well on Seroquel Will try to decrase the dose to 12.5 mg in the morning to see if she can tolerate Taper Depression On Zoloft Mood is  good  Other issues Iron deficiency anemia due to chronic blood loss Hgb Stable on Iron Chronic constipation on Amitiza to 24 mcg Aortic Stenosis Follows with Cardiology  Hypothyroidism, unspecified type TSH is Normal in 9/20  Depression, unspecified depression type On Zoloft  Osteoporosis Up to date on [employer] it out of facility Hyperlipidemia LDL 32   Family/ staff Communication:   Labs/tests ordered:

## 2020-03-03 DIAGNOSIS — R2681 Unsteadiness on feet: Secondary | ICD-10-CM | POA: Diagnosis not present

## 2020-03-03 DIAGNOSIS — R279 Unspecified lack of coordination: Secondary | ICD-10-CM | POA: Diagnosis not present

## 2020-03-03 DIAGNOSIS — M6281 Muscle weakness (generalized): Secondary | ICD-10-CM | POA: Diagnosis not present

## 2020-03-03 DIAGNOSIS — G20 Parkinson's disease: Secondary | ICD-10-CM | POA: Diagnosis not present

## 2020-03-03 DIAGNOSIS — R278 Other lack of coordination: Secondary | ICD-10-CM | POA: Diagnosis not present

## 2020-03-03 DIAGNOSIS — D500 Iron deficiency anemia secondary to blood loss (chronic): Secondary | ICD-10-CM | POA: Diagnosis not present

## 2020-03-04 DIAGNOSIS — G20 Parkinson's disease: Secondary | ICD-10-CM | POA: Diagnosis not present

## 2020-03-04 DIAGNOSIS — M6281 Muscle weakness (generalized): Secondary | ICD-10-CM | POA: Diagnosis not present

## 2020-03-04 DIAGNOSIS — R279 Unspecified lack of coordination: Secondary | ICD-10-CM | POA: Diagnosis not present

## 2020-03-04 DIAGNOSIS — R278 Other lack of coordination: Secondary | ICD-10-CM | POA: Diagnosis not present

## 2020-03-04 DIAGNOSIS — D500 Iron deficiency anemia secondary to blood loss (chronic): Secondary | ICD-10-CM | POA: Diagnosis not present

## 2020-03-04 DIAGNOSIS — R2681 Unsteadiness on feet: Secondary | ICD-10-CM | POA: Diagnosis not present

## 2020-03-07 DIAGNOSIS — R2681 Unsteadiness on feet: Secondary | ICD-10-CM | POA: Diagnosis not present

## 2020-03-07 DIAGNOSIS — R278 Other lack of coordination: Secondary | ICD-10-CM | POA: Diagnosis not present

## 2020-03-07 DIAGNOSIS — D500 Iron deficiency anemia secondary to blood loss (chronic): Secondary | ICD-10-CM | POA: Diagnosis not present

## 2020-03-07 DIAGNOSIS — R279 Unspecified lack of coordination: Secondary | ICD-10-CM | POA: Diagnosis not present

## 2020-03-07 DIAGNOSIS — G20 Parkinson's disease: Secondary | ICD-10-CM | POA: Diagnosis not present

## 2020-03-07 DIAGNOSIS — M6281 Muscle weakness (generalized): Secondary | ICD-10-CM | POA: Diagnosis not present

## 2020-03-08 DIAGNOSIS — G20 Parkinson's disease: Secondary | ICD-10-CM | POA: Diagnosis not present

## 2020-03-08 DIAGNOSIS — R2681 Unsteadiness on feet: Secondary | ICD-10-CM | POA: Diagnosis not present

## 2020-03-08 DIAGNOSIS — R279 Unspecified lack of coordination: Secondary | ICD-10-CM | POA: Diagnosis not present

## 2020-03-08 DIAGNOSIS — R278 Other lack of coordination: Secondary | ICD-10-CM | POA: Diagnosis not present

## 2020-03-08 DIAGNOSIS — D500 Iron deficiency anemia secondary to blood loss (chronic): Secondary | ICD-10-CM | POA: Diagnosis not present

## 2020-03-08 DIAGNOSIS — M6281 Muscle weakness (generalized): Secondary | ICD-10-CM | POA: Diagnosis not present

## 2020-03-09 ENCOUNTER — Encounter: Payer: Self-pay | Admitting: Nurse Practitioner

## 2020-03-09 ENCOUNTER — Non-Acute Institutional Stay (SKILLED_NURSING_FACILITY): Payer: Medicare Other | Admitting: Nurse Practitioner

## 2020-03-09 DIAGNOSIS — G20 Parkinson's disease: Secondary | ICD-10-CM | POA: Diagnosis not present

## 2020-03-09 DIAGNOSIS — R413 Other amnesia: Secondary | ICD-10-CM

## 2020-03-09 DIAGNOSIS — D500 Iron deficiency anemia secondary to blood loss (chronic): Secondary | ICD-10-CM | POA: Diagnosis not present

## 2020-03-09 DIAGNOSIS — M6281 Muscle weakness (generalized): Secondary | ICD-10-CM | POA: Diagnosis not present

## 2020-03-09 DIAGNOSIS — E02 Subclinical iodine-deficiency hypothyroidism: Secondary | ICD-10-CM | POA: Diagnosis not present

## 2020-03-09 DIAGNOSIS — I5032 Chronic diastolic (congestive) heart failure: Secondary | ICD-10-CM

## 2020-03-09 DIAGNOSIS — K5909 Other constipation: Secondary | ICD-10-CM

## 2020-03-09 DIAGNOSIS — I10 Essential (primary) hypertension: Secondary | ICD-10-CM | POA: Diagnosis not present

## 2020-03-09 DIAGNOSIS — R2681 Unsteadiness on feet: Secondary | ICD-10-CM | POA: Diagnosis not present

## 2020-03-09 DIAGNOSIS — F323 Major depressive disorder, single episode, severe with psychotic features: Secondary | ICD-10-CM

## 2020-03-09 DIAGNOSIS — R441 Visual hallucinations: Secondary | ICD-10-CM

## 2020-03-09 DIAGNOSIS — R278 Other lack of coordination: Secondary | ICD-10-CM | POA: Diagnosis not present

## 2020-03-09 DIAGNOSIS — M154 Erosive (osteo)arthritis: Secondary | ICD-10-CM

## 2020-03-09 DIAGNOSIS — R279 Unspecified lack of coordination: Secondary | ICD-10-CM | POA: Diagnosis not present

## 2020-03-09 HISTORY — DX: Erosive (osteo)arthritis: M15.4

## 2020-03-09 NOTE — Progress Notes (Signed)
Location:   [facility] Room Number: 3 Place of Service:  SNF (31) Provider:  Ellamae Lybeck, [place] NP  [name], MD  Patient Care Team: [name], MD as PCP - General (Internal Medicine) [name], MD as PCP - Cardiology (Cardiology) [name], MD (Neurosurgery) [name], MD (Neurology) [name], MD (Inactive) as Consulting Physician (Gastroenterology) [name], MD as Consulting Physician (Urology) Namari Breton X, NP as Nurse Practitioner (Internal Medicine)  Extended Emergency Contact Information Primary Emergency Contact: [facility] Address: [address]          [place], [place] [address] [name] of [place] Phone: [phone] Mobile Phone: [phone] Relation: Spouse Secondary Emergency Contact: [name] Mobile Phone: [phone] Relation: Daughter Preferred language: [name] Interpreter needed? No  Code Status:  DNR Goals of care: Advanced Directive information Advanced Directives 03/09/2020  Does Patient Have a Medical Advance Directive? Yes  Type of Advance Directive Living will;Out of facility DNR (pink MOST or yellow form)  Does patient want to make changes to medical advance directive? No - Patient declined  Copy of [place] in Chart? -  Would patient like information on creating a medical advance directive? -  Pre-existing out of facility DNR order (yellow form or pink MOST form) Yellow form placed in chart (order not valid for inpatient use)     Chief Complaint  Patient presents with   Medical Management of Chronic Issues    HPI:  Pt is a 86 y.o. female seen today for medical management of chronic diseases.    The patient resides in SNF [facility] w/c for mobility, her mood is stable, on Sertraline 75mg  qd, Quetiapine 25mg  qd, 12.5mg  qd. Hx of constipation, stable, on Senokot S I qd, Amitiza 66mcg bid, Colace qd.  Erosive osteoarthritis, stable, on  Prednisone 20mg  qd, Tylenol, Hydroxychloroquine 200mg  qd. Anemia, stable, on Fe. Parkinson's disease, stable, on MiraPex, Sinement. HTN, blood pressure is controlled on Metoprolol 25mg  qd. Hypothyroidism, stable, on Levothyroxine 53mcg qd. CHF/trace edema BLE, on Furosemide 20mg  qd.    Past Medical History:  Diagnosis Date   Anemia    Anxiety    Aortic stenosis    moderate by echo 2021    Arthritis    Broken arm    right    Chronic diastolic CHF (congestive heart failure) (HCC)    Family history of adverse reaction to anesthesia    pt. states sister vomits   Fibromyalgia    Fracture of thumb 09/01/2015   right   Fracture, foot 09/01/2015   right   GERD (gastroesophageal reflux disease)    H/O hiatal hernia    History of bronchitis    History of kidney stones    Hypercholesterolemia    Hypertension    dr t turner   Hypothyroidism    Neuropathy    Osteoporosis    Parkinson disease ([place])    PVD (peripheral vascular disease) (HCC)    99% stenosis of left anteiror tibial artery, mod stenosis of left distal SFA and popliteal artery followed by [name]   RBBB    noted on EKG 2018   Restless leg    Shortness of breath    with exertion - chronic    Sjogren's disease ([place])    SOB (shortness of breath)    chronic due to diastolic dysfunction, deconditioning, obesity   Tremor    Past Surgical History:  Procedure Laterality Date   ABDOMINAL HYSTERECTOMY  BACK SURGERY     4 back surgeries,   lumbar fusion   CARDIAC CATHETERIZATION  2006   normal   CYSTOSCOPY/URETEROSCOPY/HOLMIUM LASER/STENT PLACEMENT Left 01/06/2019   Procedure: CYSTOSCOPY/RETROGRADE/URETEROSCOPY/HOLMIUM LASER/BASKET RETRIEVAL/STENT PLACEMENT;  Surgeon: Ceasar Mons, MD;  Location: [facility]S;  Service: Urology;  Laterality: Left;   EYE SURGERY Bilateral    cateracts   falls     variious fall, broken wrist,and toes   FRACTURE SURGERY Right April 2016   Wrist,  Pt. fell   kidney stone removal Left 01/20/2019   RIGHT/LEFT HEART CATH AND CORONARY ANGIOGRAPHY N/A 02/05/2018   Procedure: RIGHT/LEFT HEART CATH AND CORONARY ANGIOGRAPHY;  Surgeon: Troy Sine, MD;  Location: [facility];  Service: Cardiovascular;  Laterality: N/A;   SPINAL CORD STIMULATOR INSERTION N/A 09/09/2015   Procedure: LUMBAR SPINAL CORD STIMULATOR INSERTION;  Surgeon: Clydell Hakim, MD;  Location: [place] OR[facility];  Service: Neurosurgery;  Laterality: N/A;  LUMBAR SPINAL CORD STIMULATOR INSERTION   SPINE SURGERY  April 2013   Back X's 4   TOTAL HIP ARTHROPLASTY     right   WRIST FRACTURE SURGERY Bilateral     Allergies  Allergen Reactions   Adrenalone    Lactose Other (See Comments)    abd pain, lactose intolerant   Morphine Other (See Comments)   Sulfa Antibiotics Other (See Comments)    Headache, very sick   Codeine Other (See Comments)    Reaction:  Headaches and nightmares    Lactose Intolerance (Gi) Nausea And Vomiting   Latex Rash   Lyrica [Pregabalin] Swelling and Other (See Comments)    Reaction:  Leg swelling   Other Other (See Comments)    Pt states that pain medications give her nightmares.     Plaquenil [Hydroxychloroquine Sulfate] Other (See Comments)    Reaction:  GI upset    Reglan [Metoclopramide] Other (See Comments)    Reaction:  GI upset    Requip [Ropinirole Hcl] Other (See Comments)    Reaction:  GI upset    Septra [Sulfamethoxazole-Trimethoprim] Nausea And Vomiting   Shellfish Allergy Nausea And Vomiting    Allergies as of 03/09/2020      Reactions   Adrenalone    Lactose Other (See Comments)   abd pain, lactose intolerant   Morphine Other (See Comments)   Sulfa Antibiotics Other (See Comments)   Headache, very sick   Codeine Other (See Comments)   Reaction:  Headaches and nightmares    Lactose Intolerance (gi) Nausea And Vomiting   Latex Rash   Lyrica [pregabalin] Swelling, Other (See Comments)   Reaction:   Leg swelling   Other Other (See Comments)   Pt states that pain medications give her nightmares.     Plaquenil [hydroxychloroquine Sulfate] Other (See Comments)   Reaction:  GI upset    Reglan [metoclopramide] Other (See Comments)   Reaction:  GI upset    Requip [ropinirole Hcl] Other (See Comments)   Reaction:  GI upset    Septra [sulfamethoxazole-trimethoprim] Nausea And Vomiting   Shellfish Allergy Nausea And Vomiting      Medication List       Accurate as of March 09, 2020 11:59 PM. If you have any questions, ask your nurse or doctor.        acetaminophen 500 MG tablet Commonly known as: TYLENOL Take 500 mg by mouth. Once A Day - PRN   TYLENOL 500 MG tablet Generic drug: acetaminophen Take 500 mg by mouth 2 (two) times  daily.   AeroChamber MV inhaler by Other route. Use as instructed as needed   albuterol 108 (90 Base) MCG/ACT inhaler Commonly known as: VENTOLIN HFA Inhale 2 puffs into the lungs every 6 (six) hours as needed for wheezing or shortness of breath.   aspirin EC 81 MG tablet Take 81 mg by mouth daily.   Calcium 600/Vitamin D3 600-800 MG-UNIT Tabs Generic drug: Calcium Carb-Cholecalciferol Take 1 tablet by mouth daily.   carbidopa-levodopa 25-100 MG tablet Commonly known as: SINEMET IR Take 2 tablets by mouth in the morning and at bedtime.   docusate sodium 100 MG capsule Commonly known as: COLACE Take 100 mg by mouth at bedtime.   eucerin cream Apply 1 application topically daily.   ezetimibe 10 MG tablet Commonly known as: ZETIA TAKE 1 TABLET ONCE DAILY.   furosemide 20 MG tablet Commonly known as: LASIX Take 20 mg by mouth daily.   hydroxychloroquine 200 MG tablet Commonly known as: PLAQUENIL Take 200 mg by mouth daily.   ibuprofen 800 MG tablet Commonly known as: ADVIL Take 800 mg by mouth every 8 (eight) hours as needed.   lansoprazole 30 MG disintegrating tablet Commonly known as: PREVACID SOLUTAB Take 30 mg by mouth daily at  12 noon.   levothyroxine 50 MCG tablet Commonly known as: SYNTHROID Take 50 mcg by mouth daily before breakfast.   lubiprostone 24 MCG capsule Commonly known as: AMITIZA Take 24 mcg by mouth 2 (two) times daily with a meal.   metoprolol tartrate 25 MG tablet Commonly known as: LOPRESSOR TAKE 1 TABLET ONCE DAILY.   multivitamin with minerals Tabs tablet Take 1 tablet by mouth daily.   nystatin powder Commonly known as: MYCOSTATIN/NYSTOP Apply topically 2 (two) times daily as needed.   potassium chloride 10 MEQ tablet Commonly known as: KLOR-CON Take 10 mEq by mouth daily.   pramipexole 1 MG tablet Commonly known as: MIRAPEX Take 1 mg by mouth 2 (two) times daily.   predniSONE 20 MG tablet Commonly known as: DELTASONE Take 20 mg by mouth daily.   PreserVision AREDS 2 Chew Chew by mouth. Once a day   QUEtiapine 25 MG tablet Commonly known as: SEROQUEL Take 25 mg by mouth 2 (two) times daily. Take 12.5 mg in Am and 25 mg in PM   rosuvastatin 20 MG tablet Commonly known as: CRESTOR Take 1 tablet (20 mg total) by mouth daily.   senna-docusate 8.6-50 MG tablet Commonly known as: Senokot-S Take 1 tablet by mouth at bedtime.   sertraline 25 MG tablet Commonly known as: ZOLOFT Take 75 mg by mouth at bedtime. A TAB AND A HALF TO EQUAL 75MG    Slow Fe 142 (45 Fe) MG Tbcr Generic drug: Ferrous Sulfate Take 1 tablet by mouth. Daily   Systane 0.4-0.3 % Soln Generic drug: Polyethyl Glycol-Propyl Glycol Apply 1 drop to eye 2 (two) times daily as needed.   Vitamin D 50 MCG (2000 UT) Caps Take 2,000 Units by mouth daily.       Review of Systems  Constitutional: Negative for activity change, appetite change, fatigue, fever and unexpected weight change.  HENT: Positive for hearing loss. Negative for congestion and voice change.   Eyes: Negative for visual disturbance.  Respiratory: Negative for cough and shortness of breath.   Cardiovascular: Positive for leg  swelling. Negative for chest pain and palpitations.  Gastrointestinal: Negative for abdominal distention, abdominal pain and constipation.  Genitourinary: Negative for difficulty urinating, dysuria and urgency.  Musculoskeletal: Positive for arthralgias, back  pain, gait problem and myalgias.       Fingers, toes, multiple sites.   Skin: Negative for color change and pallor.  Neurological: Negative for speech difficulty, weakness, light-headedness and headaches.       Memory lapses.   Psychiatric/Behavioral: Positive for confusion. Negative for agitation, behavioral problems, hallucinations and sleep disturbance. The patient is not nervous/anxious.     Immunization History  Administered Date(s) Administered   Influenza Split 09/09/2013   Influenza, High Dose Seasonal PF 09/12/2018, 09/15/2019   Influenza,inj,Quad PF,6+ Mos 09/11/2016   Moderna SARS-COVID-2 Vaccination 12/12/2019, 01/09/2020   Pneumococcal Conjugate-13 04/15/2014   Pneumococcal Polysaccharide-23 09/28/2005   Tdap 09/01/2015   Zoster Recombinat (Shingrix) 06/11/2018, 09/10/2018   Pertinent  Health Maintenance Due  Topic Date Due   INFLUENZA VACCINE  07/10/2020   DEXA SCAN  Completed   PNA vac Low Risk Adult  Completed   Fall Risk  11/16/2019 04/23/2019 04/09/2019 10/29/2018 10/27/2018  Falls in the past year? 0 0 0 0 1  Comment - - - fell June 2019 -  Number falls in past yr: - 0 0 - 0  Injury with Fall? - 0 0 - 1  Comment - - - - -  Risk for fall due to : - - - - -  Risk for fall due to: Comment - - - - -   Functional Status Survey:    Vitals:   03/09/20 0955  BP: (!) 128/56  Pulse: 68  Resp: 19  Temp: 97.6 F (36.4 C)  SpO2: 97%  Weight: 145 lb 8 oz (66 kg)  Height: 5\' 2"  (1.575 m)   Body mass index is 26.61 kg/m. Physical Exam Vitals and nursing note reviewed.  Constitutional:      General: She is not in acute distress.    Appearance: Normal appearance. She is not ill-appearing.  HENT:      Head: Normocephalic and atraumatic.     Nose: Nose normal.     Mouth/Throat:     Mouth: Mucous membranes are moist.  Eyes:     Extraocular Movements: Extraocular movements intact.     Conjunctiva/sclera: Conjunctivae normal.     Pupils: Pupils are equal, round, and reactive to light.  Cardiovascular:     Rate and Rhythm: Normal rate and regular rhythm.     Heart sounds: Murmur present.  Pulmonary:     Breath sounds: No rhonchi or rales.  Abdominal:     General: Bowel sounds are normal. There is no distension.     Palpations: Abdomen is soft.     Tenderness: There is no abdominal tenderness.  Musculoskeletal:        General: No swelling or tenderness.     Cervical back: Normal range of motion and neck supple.     Right lower leg: Edema present.     Left lower leg: Edema present.     Comments: Trace edema BLE.  Arthritic changes in fingers, R>L  Skin:    General: Skin is warm and dry.     Comments: The left 2nd toe small bruise on the top, no s/s of bleeding or infection   Neurological:     General: No focal deficit present.     Mental Status: She is alert. Mental status is at baseline.     Motor: No weakness.     Coordination: Coordination normal.     Gait: Gait abnormal.     Comments: Oriented to person, place.   Psychiatric:  Mood and Affect: Mood normal.        Behavior: Behavior normal.     Labs reviewed: Recent Labs    05/26/19 0906 05/26/19 0906 07/24/19 0000 09/08/19 0000 12/15/19 0000  NA 143  --  142 144 141  K 4.1   < > 4.3 3.5 3.9  CL 110  --  110  --  107  CO2 24  --  24  --  27*  GLUCOSE 97  --   --   --   --   BUN 13  --  12 10 16   CREATININE 0.65  --  0.7 0.7 0.7  CALCIUM 9.0  --  9.0  --  8.6*   < > = values in this interval not displayed.   Recent Labs    07/14/19 0811 07/24/19 0000 09/08/19 0000  AST 21 43* 23  ALT 7 5* 12  ALKPHOS 56 0.4* 46  BILITOT 0.3  --   --   PROT 6.1 5.8  --   ALBUMIN 4.3 3.7  --    Recent Labs      05/26/19 0906 05/26/19 0906 09/08/19 0000 02/03/20 0000 02/12/20 0000  WBC 7.4  --  7.6 7.8 7.5  NEUTROABS 4,862  --   --  4,485 4,845  HGB 9.4*   < > 11.1* 11.3* 11.0*  HCT 29.8*   < > 34* 34* 33*  MCV 89.5  --   --   --   --   PLT 302  --  245 337  --    < > = values in this interval not displayed.   Lab Results  Component Value Date   TSH 1.62 09/08/2019   No results found for: HGBA1C Lab Results  Component Value Date   CHOL 118 07/14/2019   HDL 50 07/14/2019   LDLCALC 32 07/14/2019   TRIG 182 (H) 07/14/2019   CHOLHDL 2.4 07/14/2019    Significant Diagnostic Results in last 30 days:  No results found.  Assessment/Plan Depression, psychotic ([place]) Her mood is stable, continue Sertraline 75mg  qd, tolerated GDR of Seroquel since 03/01/20, observe.   Hallucinations, visual Improved, continue Seroquel.   Memory loss Continue SNF [facility] for safety, care assistance.   Chronic constipation Stable, continue Amitiza, Colace, Senokot S  Erosive (osteo)arthritis Stable, continue Prdnisone, Tylenol, Hydroxychloroquine.   Iron deficiency anemia Stable, Hgb 11s, continue Fe  Parkinson's disease W/c for mobility, continue Sinemet, Mirapex.   Hypertension Blood pressure is controlled, continue Metoprolol.   Chronic diastolic CHF (congestive heart failure) (HCC) Compensated, trace edema BLE is chronic, continue Furosemide, f/u Cardiology.   Hypothyroidism Stable, TSH 1.62 09/08/19, continue Levothyroxine.      Family/ staff Communication: plan of care reviewed with the patient and charge nurse.   Labs/tests ordered:  none  Time spend 25 minutes.

## 2020-03-09 NOTE — Assessment & Plan Note (Signed)
Stable, TSH 1.62 09/08/19, continue Levothyroxine.

## 2020-03-09 NOTE — Assessment & Plan Note (Signed)
Stable, continue Prdnisone, Tylenol, Hydroxychloroquine.

## 2020-03-09 NOTE — Assessment & Plan Note (Signed)
Improved, continue Seroquel.

## 2020-03-09 NOTE — Assessment & Plan Note (Addendum)
Stable, continue Amitiza, Colace, Senokot S 

## 2020-03-09 NOTE — Assessment & Plan Note (Signed)
Blood pressure is controlled, continue Metoprolol. 

## 2020-03-09 NOTE — Assessment & Plan Note (Signed)
Her mood is stable, continue Sertraline 75mg  qd, tolerated GDR of Seroquel since 03/01/20, observe.

## 2020-03-09 NOTE — Assessment & Plan Note (Signed)
W/c for mobility, continue Sinemet, Mirapex.

## 2020-03-09 NOTE — Assessment & Plan Note (Signed)
Continue SNF [facility] for safety, care assistance.  

## 2020-03-09 NOTE — Assessment & Plan Note (Signed)
Compensated, trace edema BLE is chronic, continue Furosemide, f/u Cardiology.

## 2020-03-09 NOTE — Assessment & Plan Note (Signed)
Stable, Hgb 11s, continue Fe 

## 2020-03-10 DIAGNOSIS — R279 Unspecified lack of coordination: Secondary | ICD-10-CM | POA: Diagnosis not present

## 2020-03-10 DIAGNOSIS — M6281 Muscle weakness (generalized): Secondary | ICD-10-CM | POA: Diagnosis not present

## 2020-03-10 DIAGNOSIS — R278 Other lack of coordination: Secondary | ICD-10-CM | POA: Diagnosis not present

## 2020-03-10 DIAGNOSIS — G20 Parkinson's disease: Secondary | ICD-10-CM | POA: Diagnosis not present

## 2020-03-10 DIAGNOSIS — R2681 Unsteadiness on feet: Secondary | ICD-10-CM | POA: Diagnosis not present

## 2020-03-10 DIAGNOSIS — F060 Psychotic disorder with hallucinations due to known physiological condition: Secondary | ICD-10-CM | POA: Diagnosis not present

## 2020-03-14 DIAGNOSIS — R279 Unspecified lack of coordination: Secondary | ICD-10-CM | POA: Diagnosis not present

## 2020-03-14 DIAGNOSIS — R278 Other lack of coordination: Secondary | ICD-10-CM | POA: Diagnosis not present

## 2020-03-14 DIAGNOSIS — G20 Parkinson's disease: Secondary | ICD-10-CM | POA: Diagnosis not present

## 2020-03-14 DIAGNOSIS — M6281 Muscle weakness (generalized): Secondary | ICD-10-CM | POA: Diagnosis not present

## 2020-03-14 DIAGNOSIS — R2681 Unsteadiness on feet: Secondary | ICD-10-CM | POA: Diagnosis not present

## 2020-03-15 DIAGNOSIS — R278 Other lack of coordination: Secondary | ICD-10-CM | POA: Diagnosis not present

## 2020-03-15 DIAGNOSIS — G20 Parkinson's disease: Secondary | ICD-10-CM | POA: Diagnosis not present

## 2020-03-15 DIAGNOSIS — R279 Unspecified lack of coordination: Secondary | ICD-10-CM | POA: Diagnosis not present

## 2020-03-15 DIAGNOSIS — M6281 Muscle weakness (generalized): Secondary | ICD-10-CM | POA: Diagnosis not present

## 2020-03-15 DIAGNOSIS — R2681 Unsteadiness on feet: Secondary | ICD-10-CM | POA: Diagnosis not present

## 2020-03-16 DIAGNOSIS — R278 Other lack of coordination: Secondary | ICD-10-CM | POA: Diagnosis not present

## 2020-03-16 DIAGNOSIS — G20 Parkinson's disease: Secondary | ICD-10-CM | POA: Diagnosis not present

## 2020-03-16 DIAGNOSIS — M6281 Muscle weakness (generalized): Secondary | ICD-10-CM | POA: Diagnosis not present

## 2020-03-16 DIAGNOSIS — R279 Unspecified lack of coordination: Secondary | ICD-10-CM | POA: Diagnosis not present

## 2020-03-16 DIAGNOSIS — R2681 Unsteadiness on feet: Secondary | ICD-10-CM | POA: Diagnosis not present

## 2020-03-17 DIAGNOSIS — G20 Parkinson's disease: Secondary | ICD-10-CM | POA: Diagnosis not present

## 2020-03-17 DIAGNOSIS — R279 Unspecified lack of coordination: Secondary | ICD-10-CM | POA: Diagnosis not present

## 2020-03-17 DIAGNOSIS — M6281 Muscle weakness (generalized): Secondary | ICD-10-CM | POA: Diagnosis not present

## 2020-03-17 DIAGNOSIS — R278 Other lack of coordination: Secondary | ICD-10-CM | POA: Diagnosis not present

## 2020-03-17 DIAGNOSIS — R2681 Unsteadiness on feet: Secondary | ICD-10-CM | POA: Diagnosis not present

## 2020-03-18 DIAGNOSIS — R279 Unspecified lack of coordination: Secondary | ICD-10-CM | POA: Diagnosis not present

## 2020-03-18 DIAGNOSIS — G20 Parkinson's disease: Secondary | ICD-10-CM | POA: Diagnosis not present

## 2020-03-18 DIAGNOSIS — R278 Other lack of coordination: Secondary | ICD-10-CM | POA: Diagnosis not present

## 2020-03-18 DIAGNOSIS — R2681 Unsteadiness on feet: Secondary | ICD-10-CM | POA: Diagnosis not present

## 2020-03-18 DIAGNOSIS — M6281 Muscle weakness (generalized): Secondary | ICD-10-CM | POA: Diagnosis not present

## 2020-03-21 DIAGNOSIS — G20 Parkinson's disease: Secondary | ICD-10-CM | POA: Diagnosis not present

## 2020-03-21 DIAGNOSIS — R279 Unspecified lack of coordination: Secondary | ICD-10-CM | POA: Diagnosis not present

## 2020-03-21 DIAGNOSIS — R2681 Unsteadiness on feet: Secondary | ICD-10-CM | POA: Diagnosis not present

## 2020-03-21 DIAGNOSIS — M6281 Muscle weakness (generalized): Secondary | ICD-10-CM | POA: Diagnosis not present

## 2020-03-21 DIAGNOSIS — R278 Other lack of coordination: Secondary | ICD-10-CM | POA: Diagnosis not present

## 2020-03-23 DIAGNOSIS — R2681 Unsteadiness on feet: Secondary | ICD-10-CM | POA: Diagnosis not present

## 2020-03-23 DIAGNOSIS — R278 Other lack of coordination: Secondary | ICD-10-CM | POA: Diagnosis not present

## 2020-03-23 DIAGNOSIS — M6281 Muscle weakness (generalized): Secondary | ICD-10-CM | POA: Diagnosis not present

## 2020-03-23 DIAGNOSIS — G20 Parkinson's disease: Secondary | ICD-10-CM | POA: Diagnosis not present

## 2020-03-23 DIAGNOSIS — R279 Unspecified lack of coordination: Secondary | ICD-10-CM | POA: Diagnosis not present

## 2020-03-24 DIAGNOSIS — M6281 Muscle weakness (generalized): Secondary | ICD-10-CM | POA: Diagnosis not present

## 2020-03-24 DIAGNOSIS — R278 Other lack of coordination: Secondary | ICD-10-CM | POA: Diagnosis not present

## 2020-03-24 DIAGNOSIS — G20 Parkinson's disease: Secondary | ICD-10-CM | POA: Diagnosis not present

## 2020-03-24 DIAGNOSIS — R2681 Unsteadiness on feet: Secondary | ICD-10-CM | POA: Diagnosis not present

## 2020-03-24 DIAGNOSIS — R279 Unspecified lack of coordination: Secondary | ICD-10-CM | POA: Diagnosis not present

## 2020-03-28 DIAGNOSIS — M6281 Muscle weakness (generalized): Secondary | ICD-10-CM | POA: Diagnosis not present

## 2020-03-28 DIAGNOSIS — G20 Parkinson's disease: Secondary | ICD-10-CM | POA: Diagnosis not present

## 2020-03-28 DIAGNOSIS — R279 Unspecified lack of coordination: Secondary | ICD-10-CM | POA: Diagnosis not present

## 2020-03-28 DIAGNOSIS — R2681 Unsteadiness on feet: Secondary | ICD-10-CM | POA: Diagnosis not present

## 2020-03-28 DIAGNOSIS — R278 Other lack of coordination: Secondary | ICD-10-CM | POA: Diagnosis not present

## 2020-03-29 ENCOUNTER — Non-Acute Institutional Stay (SKILLED_NURSING_FACILITY): Payer: Medicare Other | Admitting: Internal Medicine

## 2020-03-29 ENCOUNTER — Encounter: Payer: Self-pay | Admitting: Internal Medicine

## 2020-03-29 DIAGNOSIS — I5032 Chronic diastolic (congestive) heart failure: Secondary | ICD-10-CM

## 2020-03-29 DIAGNOSIS — R441 Visual hallucinations: Secondary | ICD-10-CM

## 2020-03-29 DIAGNOSIS — G20 Parkinson's disease: Secondary | ICD-10-CM

## 2020-03-29 DIAGNOSIS — D500 Iron deficiency anemia secondary to blood loss (chronic): Secondary | ICD-10-CM | POA: Diagnosis not present

## 2020-03-29 NOTE — Progress Notes (Signed)
Location:   [facility] Room Number: 3 Place of Service:  SNF [phone]) Provider:  [name], MD  [name], MD  Patient Care Team: [name], MD as PCP - General (Internal Medicine) [name], MD as PCP - Cardiology (Cardiology) [name], MD (Neurosurgery) [name], MD (Neurology) [name], MD (Inactive) as Consulting Physician (Gastroenterology) [name], MD as Consulting Physician (Urology) [name], Man X, NP as Nurse Practitioner (Internal Medicine)  Extended Emergency Contact Information Primary Emergency Contact: [facility] Address: [address]          [place], [place] [address] [name] of [place] Phone: [phone] Mobile Phone: [phone] Relation: Spouse Secondary Emergency Contact: [name] Mobile Phone: [phone] Relation: Daughter Preferred language: [name] Interpreter needed? No  Code Status:  Full Code Goals of care: Advanced Directive information Advanced Directives 03/29/2020  Does Patient Have a Medical Advance Directive? Yes  Type of Advance Directive Living will;Out of facility DNR (pink MOST or yellow form)  Does patient want to make changes to medical advance directive? No - Patient declined  Copy of [place] in Chart? -  Would patient like information on creating a medical advance directive? -  Pre-existing out of facility DNR order (yellow form or pink MOST form) Yellow form placed in chart (order not valid for inpatient use)     Chief Complaint  Patient presents with  . Acute Visit    Abnormal Movements ? Dystonic    HPI:  Pt is a 86 y.o. female seen today for an acute visit for Abnormal Movements noticed on her mouth by her Nurse and therapy ? Dystonic Movement ? Seroquel Side effect  Patient has history of Parkinson disease with visual hallucinations, Erosive Arthritis, peripheral neuropathy, hypothyroid, GERD,  osteoporosis, hyperlipidemia, gout, dyspnea on exertionand Aortic stenosis  Nurses and Therapist have noticed some involuntary Tongue smacking and moving her Lips and mouth. Patient and her husband were in their room. She is doing well.She and her husband have not noticed any issues. She does c/o Hallucinations which are still there and bother her. Her Sinemet was increased by me 2 months ago. And both of them are saying she is doing much better with her transfers with therapy.  Past Medical History:  Diagnosis Date  . Anemia   . Anxiety   . Aortic stenosis    moderate by echo 2021   . Arthritis   . Broken arm    right   . Chronic diastolic CHF (congestive heart failure) ([place])   . Family history of adverse reaction to anesthesia    pt. states sister vomits  . Fibromyalgia   . Fracture of thumb 09/01/2015   right  . Fracture, foot 09/01/2015   right  . GERD (gastroesophageal reflux disease)   . H/O hiatal hernia   . History of bronchitis   . History of kidney stones   . Hypercholesterolemia   . Hypertension    dr t turner  . Hypothyroidism   . Neuropathy   . Osteoporosis   . Parkinson disease ([place])   . PVD (peripheral vascular disease) ([facility])    99% stenosis of left anteiror tibial artery, mod stenosis of left distal SFA and popliteal artery followed by [name]  . RBBB    noted on EKG 2018  . Restless leg   . Shortness of breath    with exertion - chronic   . Sjogren's disease ([facility])   .  SOB (shortness of breath)    chronic due to diastolic dysfunction, deconditioning, obesity  . Tremor    Past Surgical History:  Procedure Laterality Date  . ABDOMINAL HYSTERECTOMY    . BACK SURGERY     4 back surgeries,   lumbar fusion  . CARDIAC CATHETERIZATION  2006   normal  . CYSTOSCOPY/URETEROSCOPY/HOLMIUM LASER/STENT PLACEMENT Left 01/06/2019   Procedure: CYSTOSCOPY/RETROGRADE/URETEROSCOPY/HOLMIUM LASER/BASKET RETRIEVAL/STENT PLACEMENT;  Surgeon: Ceasar Mons,  MD;  Location: [facility];  Service: Urology;  Laterality: Left;  . EYE SURGERY Bilateral    cateracts  . falls     variious fall, broken wrist,and toes  . FRACTURE SURGERY Right April 2016   Wrist, Pt. fell  . kidney stone removal Left 01/20/2019  . RIGHT/LEFT HEART CATH AND CORONARY ANGIOGRAPHY N/A 02/05/2018   Procedure: RIGHT/LEFT HEART CATH AND CORONARY ANGIOGRAPHY;  Surgeon: Troy Sine, MD;  Location: [facility];  Service: Cardiovascular;  Laterality: N/A;  . SPINAL CORD STIMULATOR INSERTION N/A 09/09/2015   Procedure: LUMBAR SPINAL CORD STIMULATOR INSERTION;  Surgeon: Clydell Hakim, MD;  Location: [facility];  Service: Neurosurgery;  Laterality: N/A;  LUMBAR SPINAL CORD STIMULATOR INSERTION  . SPINE SURGERY  April 2013   Back X's 4  . TOTAL HIP ARTHROPLASTY     right  . WRIST FRACTURE SURGERY Bilateral     Allergies  Allergen Reactions  . Adrenalone   . Lactose Other (See Comments)    abd pain, lactose intolerant  . Morphine Other (See Comments)  . Sulfa Antibiotics Other (See Comments)    Headache, very sick  . Codeine Other (See Comments)    Reaction:  Headaches and nightmares   . Lactose Intolerance (Gi) Nausea And Vomiting  . Latex Rash  . Lyrica [Pregabalin] Swelling and Other (See Comments)    Reaction:  Leg swelling  . Other Other (See Comments)    Pt states that pain medications give her nightmares.    . Plaquenil [Hydroxychloroquine Sulfate] Other (See Comments)    Reaction:  GI upset   . Reglan [Metoclopramide] Other (See Comments)    Reaction:  GI upset   . Requip [Ropinirole Hcl] Other (See Comments)    Reaction:  GI upset   . Septra [Sulfamethoxazole-Trimethoprim] Nausea And Vomiting  . Shellfish Allergy Nausea And Vomiting    Allergies as of 03/29/2020      Reactions   Adrenalone    Lactose Other (See Comments)   abd pain, lactose intolerant   Morphine Other (See Comments)   Sulfa Antibiotics Other (See Comments)   Headache, very sick    Codeine Other (See Comments)   Reaction:  Headaches and nightmares    Lactose Intolerance (gi) Nausea And Vomiting   Latex Rash   Lyrica [pregabalin] Swelling, Other (See Comments)   Reaction:  Leg swelling   Other Other (See Comments)   Pt states that pain medications give her nightmares.     Plaquenil [hydroxychloroquine Sulfate] Other (See Comments)   Reaction:  GI upset    Reglan [metoclopramide] Other (See Comments)   Reaction:  GI upset    Requip [ropinirole Hcl] Other (See Comments)   Reaction:  GI upset    Septra [sulfamethoxazole-trimethoprim] Nausea And Vomiting   Shellfish Allergy Nausea And Vomiting      Medication List       Accurate as of March 29, 2020 10:22 AM. If you have any questions, ask your nurse or doctor.  acetaminophen 500 MG tablet Commonly known as: TYLENOL Take 500 mg by mouth. Once A Day - PRN   TYLENOL 500 MG tablet Generic drug: acetaminophen Take 500 mg by mouth 2 (two) times daily.   AeroChamber MV inhaler by Other route. Use as instructed as needed   albuterol 108 (90 Base) MCG/ACT inhaler Commonly known as: VENTOLIN HFA Inhale 2 puffs into the lungs every 6 (six) hours as needed for wheezing or shortness of breath.   aspirin EC 81 MG tablet Take 81 mg by mouth daily.   Calcium 600/Vitamin D3 600-800 MG-UNIT Tabs Generic drug: Calcium Carb-Cholecalciferol Take 1 tablet by mouth daily.   carbidopa-levodopa 25-100 MG tablet Commonly known as: SINEMET IR Take 2 tablets by mouth in the morning and at bedtime.   docusate sodium 100 MG capsule Commonly known as: COLACE Take 100 mg by mouth at bedtime.   eucerin cream Apply 1 application topically daily.   ezetimibe 10 MG tablet Commonly known as: ZETIA TAKE 1 TABLET ONCE DAILY.   furosemide 20 MG tablet Commonly known as: LASIX Take 20 mg by mouth daily.   hydroxychloroquine 200 MG tablet Commonly known as: PLAQUENIL Take 200 mg by mouth daily.   ibuprofen 800 MG  tablet Commonly known as: ADVIL Take 800 mg by mouth every 8 (eight) hours as needed.   lansoprazole 30 MG disintegrating tablet Commonly known as: PREVACID SOLUTAB Take 30 mg by mouth daily at 12 noon.   levothyroxine 50 MCG tablet Commonly known as: SYNTHROID Take 50 mcg by mouth daily before breakfast.   lubiprostone 24 MCG capsule Commonly known as: AMITIZA Take 24 mcg by mouth 2 (two) times daily with a meal.   metoprolol tartrate 25 MG tablet Commonly known as: LOPRESSOR TAKE 1 TABLET ONCE DAILY.   multivitamin with minerals Tabs tablet Take 1 tablet by mouth daily.   nystatin powder Commonly known as: MYCOSTATIN/NYSTOP Apply topically 2 (two) times daily as needed.   potassium chloride 10 MEQ tablet Commonly known as: KLOR-CON Take 10 mEq by mouth daily.   pramipexole 1 MG tablet Commonly known as: MIRAPEX Take 1 mg by mouth 2 (two) times daily.   predniSONE 20 MG tablet Commonly known as: DELTASONE Take 20 mg by mouth daily.   PreserVision AREDS 2 Chew Chew by mouth. Once a day   QUEtiapine 25 MG tablet Commonly known as: SEROQUEL Take 25 mg by mouth. Take 12.5 mg in Am and 25 mg in PM   rosuvastatin 20 MG tablet Commonly known as: CRESTOR Take 1 tablet (20 mg total) by mouth daily.   senna-docusate 8.6-50 MG tablet Commonly known as: Senokot-S Take 1 tablet by mouth at bedtime.   sertraline 25 MG tablet Commonly known as: ZOLOFT Take 75 mg by mouth at bedtime. A TAB AND A HALF TO EQUAL 75MG    Slow Fe 142 (45 Fe) MG Tbcr Generic drug: Ferrous Sulfate Take 1 tablet by mouth. Daily   Systane 0.4-0.3 % Soln Generic drug: Polyethyl Glycol-Propyl Glycol Apply 1 drop to eye 2 (two) times daily as needed.   Vitamin D 50 MCG (2000 UT) Caps Take 2,000 Units by mouth daily.       Review of Systems  Constitutional: Negative.   HENT: Negative.   Respiratory: Negative.   Cardiovascular: Positive for leg swelling.  Gastrointestinal: Negative.     Genitourinary: Negative.   Musculoskeletal: Positive for gait problem.  Skin: Negative.   Neurological: Positive for weakness.  Psychiatric/Behavioral: Positive for hallucinations. The patient  is nervous/anxious.     Immunization History  Administered Date(s) Administered  . Influenza Split 09/09/2013  . Influenza, High Dose Seasonal PF 09/12/2018, 09/15/2019  . Influenza,inj,Quad PF,6+ Mos 09/11/2016  . Moderna SARS-COVID-2 Vaccination 12/12/2019, 01/09/2020  . Pneumococcal Conjugate-13 04/15/2014  . Pneumococcal Polysaccharide-23 09/28/2005  . Tdap 09/01/2015  . Zoster Recombinat (Shingrix) 06/11/2018, 09/10/2018   Pertinent  Health Maintenance Due  Topic Date Due  . INFLUENZA VACCINE  07/10/2020  . DEXA SCAN  Completed  . PNA vac Low Risk Adult  Completed   Fall Risk  11/16/2019 04/23/2019 04/09/2019 10/29/2018 10/27/2018  Falls in the past year? 0 0 0 0 1  Comment - - - fell June 2019 -  Number falls in past yr: - 0 0 - 0  Injury with Fall? - 0 0 - 1  Comment - - - - -  Risk for fall due to : - - - - -  Risk for fall due to: Comment - - - - -   Functional Status Survey:    Vitals:   03/29/20 1014  BP: (!) 98/54  Pulse: 84  Resp: (!) 21  Temp: 97.6 F (36.4 C)  SpO2: 94%  Weight: 145 lb (65.8 kg)  Height: 5\' 2"  (1.575 m)   Body mass index is 26.52 kg/m. Physical Exam Vitals reviewed.  Constitutional:      Appearance: Normal appearance.  HENT:     Head: Normocephalic.     Nose: Nose normal.     Mouth/Throat:     Mouth: Mucous membranes are moist.     Pharynx: Oropharynx is clear.  Eyes:     Pupils: Pupils are equal, round, and reactive to light.  Cardiovascular:     Rate and Rhythm: Normal rate.     Heart sounds: Murmur present.  Pulmonary:     Effort: Pulmonary effort is normal.     Breath sounds: Normal breath sounds.  Abdominal:     General: Abdomen is flat. Bowel sounds are normal.  Musculoskeletal:        General: Swelling present.      Cervical back: Neck supple.  Skin:    General: Skin is warm.  Neurological:     General: No focal deficit present.     Mental Status: She is alert and oriented to person, place, and time.  Psychiatric:        Mood and Affect: Mood normal.        Thought Content: Thought content normal.       Labs reviewed: Recent Labs    05/26/19 0906 05/26/19 0906 07/24/19 0000 09/08/19 0000 12/15/19 0000  NA 143  --  142 144 141  K 4.1   < > 4.3 3.5 3.9  CL 110  --  110  --  107  CO2 24  --  24  --  27*  GLUCOSE 97  --   --   --   --   BUN 13  --  12 10 16   CREATININE 0.65  --  0.7 0.7 0.7  CALCIUM 9.0  --  9.0  --  8.6*   < > = values in this interval not displayed.   Recent Labs    07/14/19 0811 07/24/19 0000 09/08/19 0000  AST 21 43* 23  ALT 7 5* 12  ALKPHOS 56 0.4* 46  BILITOT 0.3  --   --   PROT 6.1 5.8  --   ALBUMIN 4.3 3.7  --  Recent Labs    05/26/19 0906 05/26/19 0906 09/08/19 0000 02/03/20 0000 02/12/20 0000  WBC 7.4  --  7.6 7.8 7.5  NEUTROABS 4,862  --   --  4,485 4,845  HGB 9.4*   < > 11.1* 11.3* 11.0*  HCT 29.8*   < > 34* 34* 33*  MCV 89.5  --   --   --   --   PLT 302  --  245 337  --    < > = values in this interval not displayed.   Lab Results  Component Value Date   TSH 1.62 09/08/2019   No results found for: HGBA1C Lab Results  Component Value Date   CHOL 118 07/14/2019   HDL 50 07/14/2019   LDLCALC 32 07/14/2019   TRIG 182 (H) 07/14/2019   CHOLHDL 2.4 07/14/2019    Significant Diagnostic Results in last 30 days:  No results found.  Assessment/Plan ? Dystonic Movements I did not notice anything today. Nor her husband has seen anything They were worried about changing her  Seroquel Dose as she is still having few Hallucinations And Overall doing better Will continue to monitor Parkinson Disease Sinemet Dose was changed to 2 Tabs in AM and PM She is doing better per husband especially with therapy Continue Same for  now Hallucinations On Seroquel. Would not change the dose right now Erosive Arthritis Back on Plaquenil  Will now slowly taper her Prednisone Low BP Will decrease the Metoprolol to 12.5 mg Other issues Bilateral edema of lower extremity Doing well with Lasix Bun and Creat stable Iron deficiency anemia due to chronic blood loss Hgb Stable on Iron Chronic constipation On Amitiza Hypothyroidism, unspecified type TSH is Normal in 9/20  Depression, unspecified depression type On Zoloft    Family/ staff Communication:   Labs/tests ordered:

## 2020-03-30 DIAGNOSIS — R278 Other lack of coordination: Secondary | ICD-10-CM | POA: Diagnosis not present

## 2020-03-30 DIAGNOSIS — M6281 Muscle weakness (generalized): Secondary | ICD-10-CM | POA: Diagnosis not present

## 2020-03-30 DIAGNOSIS — R2681 Unsteadiness on feet: Secondary | ICD-10-CM | POA: Diagnosis not present

## 2020-03-30 DIAGNOSIS — G20 Parkinson's disease: Secondary | ICD-10-CM | POA: Diagnosis not present

## 2020-03-30 DIAGNOSIS — R279 Unspecified lack of coordination: Secondary | ICD-10-CM | POA: Diagnosis not present

## 2020-03-31 DIAGNOSIS — R6889 Other general symptoms and signs: Secondary | ICD-10-CM | POA: Diagnosis not present

## 2020-03-31 DIAGNOSIS — R279 Unspecified lack of coordination: Secondary | ICD-10-CM | POA: Diagnosis not present

## 2020-03-31 DIAGNOSIS — M6281 Muscle weakness (generalized): Secondary | ICD-10-CM | POA: Diagnosis not present

## 2020-03-31 DIAGNOSIS — E039 Hypothyroidism, unspecified: Secondary | ICD-10-CM | POA: Diagnosis not present

## 2020-03-31 DIAGNOSIS — G20 Parkinson's disease: Secondary | ICD-10-CM | POA: Diagnosis not present

## 2020-03-31 DIAGNOSIS — Z79899 Other long term (current) drug therapy: Secondary | ICD-10-CM | POA: Diagnosis not present

## 2020-03-31 DIAGNOSIS — R278 Other lack of coordination: Secondary | ICD-10-CM | POA: Diagnosis not present

## 2020-03-31 DIAGNOSIS — D500 Iron deficiency anemia secondary to blood loss (chronic): Secondary | ICD-10-CM | POA: Diagnosis not present

## 2020-03-31 DIAGNOSIS — R2681 Unsteadiness on feet: Secondary | ICD-10-CM | POA: Diagnosis not present

## 2020-03-31 LAB — CBC AND DIFFERENTIAL
HCT: 33 — AB (ref 36–46)
Hemoglobin: 10.9 — AB (ref 12.0–16.0)
Platelets: 267 (ref 150–399)
WBC: 13.9

## 2020-03-31 LAB — CBC: RBC: 3.46 — AB (ref 3.87–5.11)

## 2020-04-04 DIAGNOSIS — G20 Parkinson's disease: Secondary | ICD-10-CM | POA: Diagnosis not present

## 2020-04-04 DIAGNOSIS — R278 Other lack of coordination: Secondary | ICD-10-CM | POA: Diagnosis not present

## 2020-04-04 DIAGNOSIS — R279 Unspecified lack of coordination: Secondary | ICD-10-CM | POA: Diagnosis not present

## 2020-04-04 DIAGNOSIS — R2681 Unsteadiness on feet: Secondary | ICD-10-CM | POA: Diagnosis not present

## 2020-04-04 DIAGNOSIS — M6281 Muscle weakness (generalized): Secondary | ICD-10-CM | POA: Diagnosis not present

## 2020-04-05 DIAGNOSIS — R279 Unspecified lack of coordination: Secondary | ICD-10-CM | POA: Diagnosis not present

## 2020-04-05 DIAGNOSIS — R2681 Unsteadiness on feet: Secondary | ICD-10-CM | POA: Diagnosis not present

## 2020-04-05 DIAGNOSIS — G20 Parkinson's disease: Secondary | ICD-10-CM | POA: Diagnosis not present

## 2020-04-05 DIAGNOSIS — M6281 Muscle weakness (generalized): Secondary | ICD-10-CM | POA: Diagnosis not present

## 2020-04-05 DIAGNOSIS — R278 Other lack of coordination: Secondary | ICD-10-CM | POA: Diagnosis not present

## 2020-04-06 DIAGNOSIS — M6281 Muscle weakness (generalized): Secondary | ICD-10-CM | POA: Diagnosis not present

## 2020-04-06 DIAGNOSIS — G20 Parkinson's disease: Secondary | ICD-10-CM | POA: Diagnosis not present

## 2020-04-06 DIAGNOSIS — L309 Dermatitis, unspecified: Secondary | ICD-10-CM | POA: Diagnosis not present

## 2020-04-06 DIAGNOSIS — R278 Other lack of coordination: Secondary | ICD-10-CM | POA: Diagnosis not present

## 2020-04-06 DIAGNOSIS — R279 Unspecified lack of coordination: Secondary | ICD-10-CM | POA: Diagnosis not present

## 2020-04-06 DIAGNOSIS — R2681 Unsteadiness on feet: Secondary | ICD-10-CM | POA: Diagnosis not present

## 2020-04-07 DIAGNOSIS — R279 Unspecified lack of coordination: Secondary | ICD-10-CM | POA: Diagnosis not present

## 2020-04-07 DIAGNOSIS — M6281 Muscle weakness (generalized): Secondary | ICD-10-CM | POA: Diagnosis not present

## 2020-04-07 DIAGNOSIS — R2681 Unsteadiness on feet: Secondary | ICD-10-CM | POA: Diagnosis not present

## 2020-04-07 DIAGNOSIS — G20 Parkinson's disease: Secondary | ICD-10-CM | POA: Diagnosis not present

## 2020-04-07 DIAGNOSIS — R278 Other lack of coordination: Secondary | ICD-10-CM | POA: Diagnosis not present

## 2020-04-11 ENCOUNTER — Other Ambulatory Visit: Payer: Self-pay

## 2020-04-11 ENCOUNTER — Ambulatory Visit (INDEPENDENT_AMBULATORY_CARE_PROVIDER_SITE_OTHER): Payer: Medicare Other | Admitting: Podiatry

## 2020-04-11 VITALS — Temp 98.3°F

## 2020-04-11 DIAGNOSIS — M79674 Pain in right toe(s): Secondary | ICD-10-CM | POA: Diagnosis not present

## 2020-04-11 DIAGNOSIS — B351 Tinea unguium: Secondary | ICD-10-CM

## 2020-04-11 DIAGNOSIS — M79675 Pain in left toe(s): Secondary | ICD-10-CM | POA: Diagnosis not present

## 2020-04-11 MED ORDER — CLOTRIMAZOLE-BETAMETHASONE 1-0.05 % EX CREA: 1.0000 "application " | TOPICAL_CREAM | Freq: Two times a day (BID) | CUTANEOUS | 0 refills | Status: DC

## 2020-04-13 NOTE — Progress Notes (Signed)
Subjective: 86 year old female presents the office today for concerns of both of her big toenails becoming thickened discolored with right side worse than left as often but the rest of her nails be trimmed as they are elongated causing discomfort.  She does have Parkinson's and she cannot trim the herself.  She had previously noted some bleeding on the right big toenail but denies any pus.  No recent injury. Denies any systemic complaints such as fevers, chills, nausea, vomiting. No acute changes since last appointment, and no other complaints at this time.   Objective: AAO x3, NAD DP/PT pulses palpable bilaterally, CRT less than 3 seconds Nails are hypertrophic, dystrophic, brittle, discolored, elongated 9.  In particular the right hallux toenail is most hypertrophic and is loose distally.  There is some dried blood present but there is no active bleeding.  There is no edema, erythema or any signs of infection.  No surrounding redness or drainage. Tenderness nails 1-5 bilaterally except the left hallux toenail which is been removed no open lesions or pre-ulcerative lesions are identified today. No open lesions or pre-ulcerative lesions.  No pain with calf compression, swelling, warmth, erythema  Assessment: Symptomatic onychomycosis  Plan: -All treatment options discussed with the patient including all alternatives, risks, complications.  -Debrided nails x9 without any complications or bleeding.  No signs of infection currently no active bleeding.  Discussed removal of the right hallux toenail but she wished to hold off for now.  We will continue to monitor. -Patient encouraged to call the office with any questions, concerns, change in symptoms.   Trula Slade DPM

## 2020-04-14 ENCOUNTER — Encounter: Payer: Self-pay | Admitting: Internal Medicine

## 2020-04-14 ENCOUNTER — Non-Acute Institutional Stay (SKILLED_NURSING_FACILITY): Payer: Medicare Other | Admitting: Internal Medicine

## 2020-04-14 DIAGNOSIS — R110 Nausea: Secondary | ICD-10-CM | POA: Diagnosis not present

## 2020-04-14 DIAGNOSIS — D500 Iron deficiency anemia secondary to blood loss (chronic): Secondary | ICD-10-CM | POA: Diagnosis not present

## 2020-04-14 DIAGNOSIS — F32A Depression, unspecified: Secondary | ICD-10-CM

## 2020-04-14 DIAGNOSIS — E039 Hypothyroidism, unspecified: Secondary | ICD-10-CM | POA: Diagnosis not present

## 2020-04-14 DIAGNOSIS — G20 Parkinson's disease: Secondary | ICD-10-CM | POA: Diagnosis not present

## 2020-04-14 DIAGNOSIS — F060 Psychotic disorder with hallucinations due to known physiological condition: Secondary | ICD-10-CM | POA: Diagnosis not present

## 2020-04-14 DIAGNOSIS — M154 Erosive (osteo)arthritis: Secondary | ICD-10-CM

## 2020-04-14 DIAGNOSIS — I5032 Chronic diastolic (congestive) heart failure: Secondary | ICD-10-CM | POA: Diagnosis not present

## 2020-04-14 DIAGNOSIS — G20A1 Parkinson's disease without dyskinesia, without mention of fluctuations: Secondary | ICD-10-CM

## 2020-04-14 DIAGNOSIS — F329 Major depressive disorder, single episode, unspecified: Secondary | ICD-10-CM | POA: Diagnosis not present

## 2020-04-14 DIAGNOSIS — R441 Visual hallucinations: Secondary | ICD-10-CM | POA: Diagnosis not present

## 2020-04-14 NOTE — Progress Notes (Signed)
Location:  [facility] Room Number: 3-A Place of Service:  SNF [phone]) Provider:  [name], MD  Patient Care Team: [name], MD as PCP - General (Internal Medicine) [name], MD as PCP - Cardiology (Cardiology) [name], MD (Neurosurgery) [name], MD (Neurology) [name], MD (Inactive) as Consulting Physician (Gastroenterology) [name], MD as Consulting Physician (Urology) [name], Man X, NP as Nurse Practitioner (Internal Medicine)  Extended Emergency Contact Information Primary Emergency Contact: [facility] Address: [address]          [place], [place] [address] [name] of [place] Phone: [phone] Mobile Phone: [phone] Relation: Spouse Secondary Emergency Contact: [name] Mobile Phone: [phone] Relation: Daughter Preferred language: [name] Interpreter needed? No  Code Status:  Full Code Goals of care: Advanced Directive information Advanced Directives 03/29/2020  Does Patient Have a Medical Advance Directive? Yes  Type of Advance Directive Living will;Out of facility DNR (pink MOST or yellow form)  Does patient want to make changes to medical advance directive? No - Patient declined  Copy of [place] in Chart? -  Would patient like information on creating a medical advance directive? -  Pre-existing out of facility DNR order (yellow form or pink MOST form) Yellow form placed in chart (order not valid for inpatient use)     Chief Complaint  Patient presents with  . Medical Management of Chronic Issues    Routine [facility] visit    HPI:  Pt is an 86 y.o. female seen today for medical management of chronic diseases.    Patient has history of Parkinson disease with visual hallucinations, Erosive Arthritis, peripheral neuropathy, hypothyroid, GERD, osteoporosis, hyperlipidemia, gout, dyspnea on exertionand Aortic  stenosis  Parkinson Disease Symptoms Controlled on Sinemet. Doing well with therapy Continues to have Hallucinations but says it doe snot bother her that much. Nausea Today she is c/o Nausea and weakness. Bowels moving  Erosive Arthritis Was started on Plaquenil by rheumatology Tapering Prednisone.  Depression Mood much better Past Medical History:  Diagnosis Date  . Anemia   . Anxiety   . Aortic stenosis    moderate by echo 2021   . Arthritis   . Broken arm    right   . Chronic diastolic CHF (congestive heart failure) ([place])   . Family history of adverse reaction to anesthesia    pt. states sister vomits  . Fibromyalgia   . Fracture of thumb 09/01/2015   right  . Fracture, foot 09/01/2015   right  . GERD (gastroesophageal reflux disease)   . H/O hiatal hernia   . History of bronchitis   . History of kidney stones   . Hypercholesterolemia   . Hypertension    dr t turner  . Hypothyroidism   . Neuropathy   . Osteoporosis   . Parkinson disease ([place])   . PVD (peripheral vascular disease) (H[facility])    99% stenosis of left anteiror tibial artery, mod stenosis of left distal SFA and popliteal artery followed by [name]  . RBBB    noted on EKG 2018  . Restless leg   . Shortness of breath    with exertion - chronic   . Sjogren's disease ([place])   . SOB (shortness of breath)    chronic due to diastolic dysfunction, deconditioning, obesity  . Tremor    Past Surgical History:  Procedure Laterality Date  . ABDOMINAL HYSTERECTOMY    . BACK  SURGERY     4 back surgeries,   lumbar fusion  . CARDIAC CATHETERIZATION  2006   normal  . CYSTOSCOPY/URETEROSCOPY/HOLMIUM LASER/STENT PLACEMENT Left 01/06/2019   Procedure: CYSTOSCOPY/RETROGRADE/URETEROSCOPY/HOLMIUM LASER/BASKET RETRIEVAL/STENT PLACEMENT;  Surgeon: Ceasar Mons, MD;  Location: [facility];  Service: Urology;  Laterality: Left;  . EYE SURGERY Bilateral    cateracts  . falls     variious fall, broken wrist,and  toes  . FRACTURE SURGERY Right April 2016   Wrist, Pt. fell  . kidney stone removal Left 01/20/2019  . RIGHT/LEFT HEART CATH AND CORONARY ANGIOGRAPHY N/A 02/05/2018   Procedure: RIGHT/LEFT HEART CATH AND CORONARY ANGIOGRAPHY;  Surgeon: Troy Sine, MD;  Location: [facility] CV LAB;  Service: Cardiovascular;  Laterality: N/A;  . SPINAL CORD STIMULATOR INSERTION N/A 09/09/2015   Procedure: LUMBAR SPINAL CORD STIMULATOR INSERTION;  Surgeon: Clydell Hakim, MD;  Location: [facility] ORS;  Service: Neurosurgery;  Laterality: N/A;  LUMBAR SPINAL CORD STIMULATOR INSERTION  . SPINE SURGERY  April 2013   Back X's 4  . TOTAL HIP ARTHROPLASTY     right  . WRIST FRACTURE SURGERY Bilateral     Allergies  Allergen Reactions  . Adrenalone   . Lactose Other (See Comments)    abd pain, lactose intolerant  . Morphine Other (See Comments)  . Sulfa Antibiotics Other (See Comments)    Headache, very sick  . Codeine Other (See Comments)    Reaction:  Headaches and nightmares   . Lactose Intolerance (Gi) Nausea And Vomiting  . Latex Rash  . Lyrica [Pregabalin] Swelling and Other (See Comments)    Reaction:  Leg swelling  . Other Other (See Comments)    Pt states that pain medications give her nightmares.    . Plaquenil [Hydroxychloroquine Sulfate] Other (See Comments)    Reaction:  GI upset   . Reglan [Metoclopramide] Other (See Comments)    Reaction:  GI upset   . Requip [Ropinirole Hcl] Other (See Comments)    Reaction:  GI upset   . Septra [Sulfamethoxazole-Trimethoprim] Nausea And Vomiting  . Shellfish Allergy Nausea And Vomiting    Outpatient Encounter Medications as of 04/14/2020  Medication Sig  . acetaminophen (TYLENOL) 500 MG tablet Take 500 mg by mouth daily as needed.   Marland Kitchen acetaminophen (TYLENOL) 500 MG tablet Take 500 mg by mouth 2 (two) times daily.  Marland Kitchen albuterol (PROVENTIL HFA;VENTOLIN HFA) 108 (90 Base) MCG/ACT inhaler Inhale 2 puffs into the lungs every 6 (six) hours as needed for  wheezing or shortness of breath.  Marland Kitchen aspirin EC 81 MG tablet Take 81 mg by mouth daily.   . Calcium Carb-Cholecalciferol (CALCIUM 600/VITAMIN D3) 600-800 MG-UNIT TABS Take 1 tablet by mouth daily.  . carbidopa-levodopa (SINEMET IR) 25-100 MG tablet Take 2 tablets by mouth in the morning and at bedtime.   . Cholecalciferol (VITAMIN D) 2000 units CAPS Take 2,000 Units by mouth daily.  . clotrimazole-betamethasone (LOTRISONE) cream Apply 1 application topically 2 (two) times daily.  Marland Kitchen docusate sodium (COLACE) 100 MG capsule Take 100 mg by mouth at bedtime.  Marland Kitchen ezetimibe (ZETIA) 10 MG tablet TAKE 1 TABLET ONCE DAILY.  Marland Kitchen Ferrous Sulfate (SLOW FE) 142 (45 Fe) MG TBCR Take 1 tablet by mouth. Daily  . furosemide (LASIX) 20 MG tablet Take 20 mg by mouth daily.  . hydrocortisone 2.5 % cream Apply 1 application topically 2 (two) times daily.  . hydroxychloroquine (PLAQUENIL) 200 MG tablet Take 200 mg by mouth daily.  . lansoprazole (  PREVACID SOLUTAB) 30 MG disintegrating tablet Take 30 mg by mouth daily at 12 noon.  Marland Kitchen levothyroxine (SYNTHROID, LEVOTHROID) 50 MCG tablet Take 50 mcg by mouth daily before breakfast.   . lubiprostone (AMITIZA) 24 MCG capsule Take 24 mcg by mouth daily.   . Multiple Vitamin (MULTIVITAMIN WITH MINERALS) TABS tablet Take 1 tablet by mouth daily.  . Multiple Vitamins-Minerals (PRESERVISION AREDS 2) CHEW Chew 1 tablet by mouth daily.   Marland Kitchen nystatin (MYCOSTATIN/NYSTOP) powder Apply topically 2 (two) times daily as needed.  Vladimir Faster Glycol-Propyl Glycol (SYSTANE) 0.4-0.3 % SOLN Apply 1 drop to eye 2 (two) times daily as needed.  . potassium chloride (KLOR-CON) 10 MEQ tablet Take 10 mEq by mouth daily.  . pramipexole (MIRAPEX) 1 MG tablet Take 1 mg by mouth 2 (two) times daily.  Derrill Memo ON 04/15/2020] predniSONE (DELTASONE) 1 MG tablet Take 3 mg by mouth daily with breakfast. 3 tabs x5 days  . QUEtiapine (SEROQUEL) 25 MG tablet Take 12.5-25 mg by mouth See admin instructions. Take 12.5  mg in Am and 25 mg in PM  . rosuvastatin (CRESTOR) 20 MG tablet Take 1 tablet (20 mg total) by mouth daily.  Marland Kitchen senna-docusate (SENOKOT-S) 8.6-50 MG tablet Take 1 tablet by mouth at bedtime.   . sertraline (ZOLOFT) 50 MG tablet Take 75 mg by mouth daily. Take 1.5 tablets to = 75 mg  . Skin Protectants, Misc. (EUCERIN) cream Apply 1 application topically daily.   Marland Kitchen Spacer/Aero-Holding Chambers (AEROCHAMBER MV) inhaler by Other route. Use as instructed as needed  . [DISCONTINUED] ibuprofen (ADVIL) 800 MG tablet Take 800 mg by mouth every 8 (eight) hours as needed.  . [DISCONTINUED] metoprolol tartrate (LOPRESSOR) 25 MG tablet TAKE 1 TABLET ONCE DAILY.  . [DISCONTINUED] predniSONE (DELTASONE) 20 MG tablet Take 20 mg by mouth daily.  . [DISCONTINUED] sertraline (ZOLOFT) 25 MG tablet Take 75 mg by mouth at bedtime. A TAB AND A HALF TO EQUAL 75MG    No facility-administered encounter medications on file as of 04/14/2020.    Review of Systems  Immunization History  Administered Date(s) Administered  . Influenza Split 09/09/2013  . Influenza, High Dose Seasonal PF 09/12/2018, 09/15/2019  . Influenza,inj,Quad PF,6+ Mos 09/11/2016  . Moderna SARS-COVID-2 Vaccination 12/12/2019, 01/09/2020  . Pneumococcal Conjugate-13 04/15/2014  . Pneumococcal Polysaccharide-23 09/28/2005  . Tdap 09/01/2015  . Zoster Recombinat (Shingrix) 06/11/2018, 09/10/2018   Pertinent  Health Maintenance Due  Topic Date Due  . INFLUENZA VACCINE  07/10/2020  . DEXA SCAN  Completed  . PNA vac Low Risk Adult  Completed   Fall Risk  11/16/2019 04/23/2019 04/09/2019 10/29/2018 10/27/2018  Falls in the past year? 0 0 0 0 1  Comment - - - fell June 2019 -  Number falls in past yr: - 0 0 - 0  Injury with Fall? - 0 0 - 1  Comment - - - - -  Risk for fall due to : - - - - -  Risk for fall due to: Comment - - - - -   Functional Status Survey:    Vitals:   04/14/20 1444  BP: 100/64  Pulse: 66  Resp: 20  Temp: 97.9 F (36.6  C)  TempSrc: Oral  SpO2: 97%  Weight: 144 lb 6.4 oz (65.5 kg)  Height: 5\' 2"  (1.575 m)   Body mass index is 26.41 kg/m. Physical Exam  Constitutional: Oriented to person, place, and time. Well-developed and well-nourished.  HENT:  Head: Normocephalic.  Mouth/Throat: Oropharynx is  clear and moist.  Eyes: Pupils are equal, round, and reactive to light.  Neck: Neck supple.  Cardiovascular: Normal rate and normal heart sounds.  Murmur Present Pulmonary/Chest: Effort normal and breath sounds normal. No respiratory distress. No wheezes. She has no rales.  Abdominal: Soft. Bowel sounds are normal. No distension. There is no tenderness. There is no rebound.  Musculoskeletal: No edema. Chronic Arthritis on her Fingers Lymphadenopathy: none Neurological: Alert and oriented to person, place, and time.  Skin: Skin is warm and dry.  Psychiatric: Normal mood and affect. Behavior is normal. Thought content normal.    Labs reviewed: Recent Labs    05/26/19 0906 05/26/19 0906 07/24/19 0000 09/08/19 0000 12/15/19 0000  NA 143  --  142 144 141  K 4.1   < > 4.3 3.5 3.9  CL 110  --  110  --  107  CO2 24  --  24  --  27*  GLUCOSE 97  --   --   --   --   BUN 13  --  12 10 16   CREATININE 0.65  --  0.7 0.7 0.7  CALCIUM 9.0  --  9.0  --  8.6*   < > = values in this interval not displayed.   Recent Labs    07/14/19 0811 07/24/19 0000 09/08/19 0000  AST 21 43* 23  ALT 7 5* 12  ALKPHOS 56 0.4* 46  BILITOT 0.3  --   --   PROT 6.1 5.8  --   ALBUMIN 4.3 3.7  --    Recent Labs    05/26/19 0906 05/26/19 0906 09/08/19 0000 02/03/20 0000 02/12/20 0000  WBC 7.4  --  7.6 7.8 7.5  NEUTROABS 4,862  --   --  4,485 4,845  HGB 9.4*   < > 11.1* 11.3* 11.0*  HCT 29.8*   < > 34* 34* 33*  MCV 89.5  --   --   --   --   PLT 302  --  245 337  --    < > = values in this interval not displayed.   Lab Results  Component Value Date   TSH 1.62 09/08/2019   No results found for: HGBA1C Lab  Results  Component Value Date   CHOL 118 07/14/2019   HDL 50 07/14/2019   LDLCALC 32 07/14/2019   TRIG 182 (H) 07/14/2019   CHOLHDL 2.4 07/14/2019    Significant Diagnostic Results in last 30 days:  No results found.  Assessment/Plan .Parkinson's disease ([place]) Doing well on Sinemet and Mirapex Working with therapy  Hallucinations, visual On Seroquel Mood better Not candidate for GRE right now Hypotension Lopressor Discontinued  Chronic diastolic CHF (congestive heart failure) (H[facility]) Weight stable On Lasix  Iron deficiency anemia due to chronic blood loss Continue Iron Hgb stable Erosive (osteo)arthritis Recently started on Plaquenil Tapering Prednisone Nausea ? Etiology Possible Plaquenil / Amitiza Told Nurses to make sure take Amitiza with Food Chronic constipation on Amitiza to 24 mcg Aortic Stenosis Follows with Cardiology  Hypothyroidism, unspecified type TSH Normal in  Depression, unspecified depression type Stable on Zoloft Seen by Facility Nurse  Osteoporosis Up to date on [employer] it out of facility Hyperlipidemia LDL 32 On Crestor Onychomycosis of Toes On Lotrisone per [employer] Communication  Labs/tests ordered:

## 2020-04-15 ENCOUNTER — Non-Acute Institutional Stay (SKILLED_NURSING_FACILITY): Payer: Medicare Other | Admitting: Nurse Practitioner

## 2020-04-15 ENCOUNTER — Telehealth: Payer: Self-pay | Admitting: Internal Medicine

## 2020-04-15 ENCOUNTER — Encounter: Payer: Self-pay | Admitting: Nurse Practitioner

## 2020-04-15 DIAGNOSIS — F323 Major depressive disorder, single episode, severe with psychotic features: Secondary | ICD-10-CM | POA: Diagnosis not present

## 2020-04-15 DIAGNOSIS — R6889 Other general symptoms and signs: Secondary | ICD-10-CM | POA: Diagnosis not present

## 2020-04-15 DIAGNOSIS — G20 Parkinson's disease: Secondary | ICD-10-CM | POA: Diagnosis not present

## 2020-04-15 DIAGNOSIS — R300 Dysuria: Secondary | ICD-10-CM | POA: Diagnosis not present

## 2020-04-15 DIAGNOSIS — Z79899 Other long term (current) drug therapy: Secondary | ICD-10-CM | POA: Diagnosis not present

## 2020-04-15 DIAGNOSIS — M154 Erosive (osteo)arthritis: Secondary | ICD-10-CM | POA: Diagnosis not present

## 2020-04-15 DIAGNOSIS — R509 Fever, unspecified: Secondary | ICD-10-CM | POA: Insufficient documentation

## 2020-04-15 DIAGNOSIS — D500 Iron deficiency anemia secondary to blood loss (chronic): Secondary | ICD-10-CM | POA: Diagnosis not present

## 2020-04-15 DIAGNOSIS — A4151 Sepsis due to Escherichia coli [E. coli]: Secondary | ICD-10-CM | POA: Diagnosis not present

## 2020-04-15 DIAGNOSIS — N390 Urinary tract infection, site not specified: Secondary | ICD-10-CM | POA: Diagnosis not present

## 2020-04-15 LAB — HEPATIC FUNCTION PANEL
ALT: 3 — AB (ref 7–35)
AST: 19 (ref 13–35)
Alkaline Phosphatase: 47 (ref 25–125)
Bilirubin, Total: 0.5

## 2020-04-15 LAB — BASIC METABOLIC PANEL
BUN: 26 — AB (ref 4–21)
CO2: 25 — AB (ref 13–22)
Chloride: 104 (ref 99–108)
Creatinine: 1.3 — AB (ref 0.5–1.1)
Glucose: 119
Potassium: 3.8 (ref 3.4–5.3)
Sodium: 138 (ref 137–147)

## 2020-04-15 LAB — COMPREHENSIVE METABOLIC PANEL
Albumin: 3.5 (ref 3.5–5.0)
Calcium: 8.7 (ref 8.7–10.7)
Globulin: 2.1

## 2020-04-15 LAB — CBC AND DIFFERENTIAL
HCT: 29 — AB (ref 36–46)
Hemoglobin: 9.6 — AB (ref 12.0–16.0)
Platelets: 173 (ref 150–399)
WBC: 17.6

## 2020-04-15 LAB — CBC: RBC: 3.1 — AB (ref 3.87–5.11)

## 2020-04-15 NOTE — Assessment & Plan Note (Signed)
Hx of recurrent UTI, dysuria, lower abd pain x 3 days, nauseated today, will empirical  Nitrofurantoin 100mg  bid x 7 days, Pyridium 100mg  tid x 2 days.

## 2020-04-15 NOTE — Assessment & Plan Note (Signed)
Her mood is better controlled, continue Sertraline, Quetiapine.

## 2020-04-15 NOTE — Assessment & Plan Note (Signed)
Pain is better controlled, continue Tylenol, Prednisone, Plaqueni

## 2020-04-15 NOTE — Progress Notes (Signed)
Location:   SNF Bath Room Number: 3 Place of Service:  SNF (31) Provider: [facility] Latreece Mochizuki NP  [name], MD  Patient Care Team: [name], MD as PCP - General (Internal Medicine) [name], MD as PCP - Cardiology (Cardiology) [name], MD (Neurosurgery) [name], MD (Neurology) [name], MD (Inactive) as Consulting Physician (Gastroenterology) [name], MD as Consulting Physician (Urology) Swayzee Wadley X, NP as Nurse Practitioner (Internal Medicine)  Extended Emergency Contact Information Primary Emergency Contact: [facility] Address: [address]          [place], [place] [address] [name] of [place] Phone: [phone] Mobile Phone: [phone] Relation: Spouse Secondary Emergency Contact: [name] Mobile Phone: [phone] Relation: Daughter Preferred language: [name] Interpreter needed? No  Code Status: DNR Goals of care: Advanced Directive information Advanced Directives 03/29/2020  Does Patient Have a Medical Advance Directive? Yes  Type of Advance Directive Living will;Out of facility DNR (pink MOST or yellow form)  Does patient want to make changes to medical advance directive? No - Patient declined  Copy of [place] in Chart? -  Would patient like information on creating a medical advance directive? -  Pre-existing out of facility DNR order (yellow form or pink MOST form) Yellow form placed in chart (order not valid for inpatient use)     Chief Complaint  Patient presents with   Acute Visit    Fever    HPI:  Pt is a 86 y.o. female seen today for an acute visit for fever 100.3, 99.3 and relived headache after Tylenol. The patient stated lower abd pain, dysuria x 3 days, nauseated today. Last BM this morning. Pending stat UA C/S, CBC/diff, CMP/eGFR, CXR. Hx of recurrent UTI. OA/lower back pain, on Tylenol 536m bid. Edema BLE, minimal, on Furosemide 273m qd. Erosive arthritis, on Prednisone, Plaquenil. Her mood is stable on Sertraline, Quetiapine.    Past Medical History:  Diagnosis Date   Anemia    Anxiety    Aortic stenosis    moderate by echo 2021    Arthritis    Broken arm    right    Chronic diastolic CHF (congestive heart failure) (HCC)    Family history of adverse reaction to anesthesia    pt. states sister vomits   Fibromyalgia    Fracture of thumb 09/01/2015   right   Fracture, foot 09/01/2015   right   GERD (gastroesophageal reflux disease)    H/O hiatal hernia    History of bronchitis    History of kidney stones    Hypercholesterolemia    Hypertension    dr t turner   Hypothyroidism    Neuropathy    Osteoporosis    Parkinson disease ([facility]   PVD (peripheral vascular disease) (HCC)    99% stenosis of left anteiror tibial artery, mod stenosis of left distal SFA and popliteal artery followed by Dr. FiOneida Alar RBBB    noted on EKG 2018   Restless leg    Shortness of breath    with exertion - chronic    S[place]ogren's disease ([facility]   SOB (shortness of breath)    chronic due to diastolic dysfunction, deconditioning, obesity   Tremor    Past Surgical History:  Procedure Laterality Date   ABDOMINAL HYSTERECTOMY     BACK SURGERY     4 back surgeries,   lumbar fusion   CARDIAC CATHETERIZATION  2006  normal   CYSTOSCOPY/URETEROSCOPY/HOLMIUM LASER/STENT PLACEMENT Left 01/06/2019   Procedure: CYSTOSCOPY/RETROGRADE/URETEROSCOPY/HOLMIUM LASER/BASKET RETRIEVAL/STENT PLACEMENT;  Surgeon: Ceasar Mons, MD;  Location: [facility];  Service: Urology;  Laterality: Left;   EYE SURGERY Bilateral    cateracts   falls     variious fall, broken wrist,and toes   FRACTURE SURGERY Right April 2016   Wrist, Pt. fell   kidney stone removal Left 01/20/2019   RIGHT/LEFT HEART CATH AND CORONARY ANGIOGRAPHY N/A 02/05/2018   Procedure: RIGHT/LEFT HEART CATH AND CORONARY ANGIOGRAPHY;  Surgeon:  Troy Sine, MD;  Location: [place] [facility];  Service: Cardiovascular;  Laterality: N/A;   SPINAL CORD STIMULATOR INSERTION N/A 09/09/2015   Procedure: LUMBAR SPINAL CORD STIMULATOR INSERTION;  Surgeon: Clydell Hakim, MD;  Location: [place] [facility] OR[facility];  Service: Neurosurgery;  Laterality: N/A;  LUMBAR SPINAL CORD STIMULATOR INSERTION   SPINE SURGERY  April 2013   Back X's 4   TOTAL HIP ARTHROPLASTY     right   WRIST FRACTURE SURGERY Bilateral     Allergies  Allergen Reactions   Adrenalone    Lactose Other (See Comments)    abd pain, lactose intolerant   Morphine Other (See Comments)   Sulfa Antibiotics Other (See Comments)    Headache, very sick   Codeine Other (See Comments)    Reaction:  Headaches and nightmares    Lactose Intolerance (Gi) Nausea And Vomiting   Latex Rash   Lyrica [Pregabalin] Swelling and Other (See Comments)    Reaction:  Leg swelling   Other Other (See Comments)    Pt states that pain medications give her nightmares.     Plaquenil [Hydroxychloroquine Sulfate] Other (See Comments)    Reaction:  GI upset    Reglan [Metoclopramide] Other (See Comments)    Reaction:  GI upset    Requip [Ropinirole Hcl] Other (See Comments)    Reaction:  GI upset    Septra [Sulfamethoxazole-Trimethoprim] Nausea And Vomiting   Shellfish Allergy Nausea And Vomiting    Allergies as of 04/15/2020      Reactions   Adrenalone    Lactose Other (See Comments)   abd pain, lactose intolerant   Morphine Other (See Comments)   Sulfa Antibiotics Other (See Comments)   Headache, very sick   Codeine Other (See Comments)   Reaction:  Headaches and nightmares    Lactose Intolerance (gi) Nausea And Vomiting   Latex Rash   Lyrica [pregabalin] Swelling, Other (See Comments)   Reaction:  Leg swelling   Other Other (See Comments)   Pt states that pain medications give her nightmares.     Plaquenil [hydroxychloroquine Sulfate] Other (See Comments)   Reaction:  GI upset      Reglan [metoclopramide] Other (See Comments)   Reaction:  GI upset    Requip [ropinirole Hcl] Other (See Comments)   Reaction:  GI upset    Septra [sulfamethoxazole-trimethoprim] Nausea And Vomiting   Shellfish Allergy Nausea And Vomiting      Medication List       Accurate as of Apr 15, 2020 11:59 PM. If you have any questions, ask your nurse or doctor.        acetaminophen 500 MG tablet Commonly known as: TYLENOL Take 500 mg by mouth daily as needed.   TYLENOL 500 MG tablet Generic drug: acetaminophen Take 500 mg by mouth 2 (two) times daily.   AeroChamber MV inhaler by Other route. Use as instructed as needed   albuterol 108 (90 Base) MCG/ACT inhaler  Commonly known as: VENTOLIN HFA Inhale 2 puffs into the lungs every 6 (six) hours as needed for wheezing or shortness of breath.   aspirin EC 81 MG tablet Take 81 mg by mouth daily.   Calcium 600/Vitamin D3 600-800 MG-UNIT Tabs Generic drug: Calcium Carb-Cholecalciferol Take 1 tablet by mouth daily.   carbidopa-levodopa 25-100 MG tablet Commonly known as: SINEMET IR Take 2 tablets by mouth in the morning and at bedtime.   clotrimazole-betamethasone cream Commonly known as: Lotrisone Apply 1 application topically 2 (two) times daily.   docusate sodium 100 MG capsule Commonly known as: COLACE Take 100 mg by mouth at bedtime.   eucerin cream Apply 1 application topically daily.   ezetimibe 10 MG tablet Commonly known as: ZETIA TAKE 1 TABLET ONCE DAILY.   furosemide 20 MG tablet Commonly known as: LASIX Take 20 mg by mouth daily.   hydrocortisone 2.5 % cream Apply 1 application topically 2 (two) times daily.   hydroxychloroquine 200 MG tablet Commonly known as: PLAQUENIL Take 200 mg by mouth daily.   lansoprazole 30 MG disintegrating tablet Commonly known as: PREVACID SOLUTAB Take 30 mg by mouth daily at 12 noon.   levothyroxine 50 MCG tablet Commonly known as: SYNTHROID Take 50 mcg by mouth daily  before breakfast.   lubiprostone 24 MCG capsule Commonly known as: AMITIZA Take 24 mcg by mouth daily.   multivitamin with minerals Tabs tablet Take 1 tablet by mouth daily.   nystatin powder Commonly known as: MYCOSTATIN/NYSTOP Apply topically 2 (two) times daily as needed.   phenazopyridine 100 MG tablet Commonly known as: PYRIDIUM Take 100 mg by mouth in the morning and at bedtime.   potassium chloride 10 MEQ tablet Commonly known as: KLOR-CON Take 10 mEq by mouth daily.   pramipexole 1 MG tablet Commonly known as: MIRAPEX Take 1 mg by mouth 2 (two) times daily.   predniSONE 1 MG tablet Commonly known as: DELTASONE Take 3 mg by mouth daily with breakfast. 3 tabs x5 days   PreserVision AREDS 2 Chew Chew 1 tablet by mouth daily.   QUEtiapine 25 MG tablet Commonly known as: SEROQUEL Take 12.5-25 mg by mouth See admin instructions. Take 12.5 mg in Am and 25 mg in PM   rosuvastatin 20 MG tablet Commonly known as: CRESTOR Take 1 tablet (20 mg total) by mouth daily.   saccharomyces boulardii 250 MG capsule Commonly known as: FLORASTOR Take 250 mg by mouth 2 (two) times daily.   senna-docusate 8.6-50 MG tablet Commonly known as: Senokot-S Take 1 tablet by mouth at bedtime.   sertraline 50 MG tablet Commonly known as: ZOLOFT Take 75 mg by mouth daily. Take 1.5 tablets to = 75 mg   Slow Fe 142 (45 Fe) MG Tbcr Generic drug: Ferrous Sulfate Take 1 tablet by mouth. Daily   Systane 0.4-0.3 % Soln Generic drug: Polyethyl Glycol-Propyl Glycol Apply 1 drop to eye 2 (two) times daily as needed.   Vitamin D 50 MCG (2000 UT) Caps Take 2,000 Units by mouth daily.       Review of Systems  Constitutional: Positive for fatigue and fever. Negative for activity change and appetite change.  HENT: Positive for hearing loss. Negative for congestion and voice change.   Eyes: Negative for visual disturbance.  Respiratory: Negative for cough and shortness of breath.     Cardiovascular: Positive for leg swelling. Negative for palpitations.  Gastrointestinal: Positive for abdominal pain and nausea. Negative for abdominal distention, constipation, diarrhea and vomiting.  Lower abd pain  Genitourinary: Positive for dysuria. Negative for difficulty urinating and urgency.  Musculoskeletal: Positive for arthralgias, back pain, gait problem and myalgias.       Chronic lower back pain.   Skin: Negative for color change.  Neurological: Positive for headaches. Negative for dizziness, speech difficulty and weakness.       Memory lapses.   Psychiatric/Behavioral: Negative for agitation, behavioral problems, hallucinations and sleep disturbance. The patient is not nervous/anxious.     Immunization History  Administered Date(s) Administered   Influenza Split 09/09/2013   Influenza, High Dose Seasonal PF 09/12/2018, 09/15/2019   Influenza,inj,Quad PF,6+ Mos 09/11/2016   Moderna SARS-COVID-2 Vaccination 12/12/2019, 01/09/2020   Pneumococcal Conjugate-13 04/15/2014   Pneumococcal Polysaccharide-23 09/28/2005   Tdap 09/01/2015   Zoster Recombinat (Shingrix) 06/11/2018, 09/10/2018   Pertinent  Health Maintenance Due  Topic Date Due   INFLUENZA VACCINE  07/10/2020   DEXA SCAN  Completed   PNA vac Low Risk Adult  Completed   Fall Risk  11/16/2019 04/23/2019 04/09/2019 10/29/2018 10/27/2018  Falls in the past year? 0 0 0 0 1  Comment - - - fell June 2019 -  Number falls in past yr: - 0 0 - 0  Injury with Fall? - 0 0 - 1  Comment - - - - -  Risk for fall due to : - - - - -  Risk for fall due to: Comment - - - - -   Functional Status Survey:    Vitals:   04/15/20 1524  BP: 100/64  Pulse: 66  Resp: 20  Temp: 100 F (37.8 C)  SpO2: 95%  Weight: 144 lb 6.4 oz (65.5 kg)  Height: '5\' 2"'  (1.575 m)   Body mass index is 26.41 kg/m. Physical Exam Vitals and nursing note reviewed.  Constitutional:      Appearance: Normal appearance.  HENT:      Head: Normocephalic and atraumatic.     Mouth/Throat:     Mouth: Mucous membranes are moist.  Eyes:     Extraocular Movements: Extraocular movements intact.     Conjunctiva/sclera: Conjunctivae normal.     Pupils: Pupils are equal, round, and reactive to light.  Cardiovascular:     Rate and Rhythm: Normal rate and regular rhythm.     Heart sounds: Murmur present.  Pulmonary:     Breath sounds: No rhonchi or rales.  Abdominal:     General: Bowel sounds are normal. There is no distension.     Palpations: Abdomen is soft.     Tenderness: There is abdominal tenderness. There is no right CVA tenderness, left CVA tenderness, guarding or rebound.     Comments: Lower abd/suprapubic area discomfort when palpated, the patient she feels more with urination.   Musculoskeletal:        General: No swelling or tenderness.     Cervical back: Normal range of motion and neck supple.     Right lower leg: Edema present.     Left lower leg: Edema present.     Comments: Trace edema BLE.  Arthritic changes in fingers, R>L  Skin:    General: Skin is warm and dry.  Neurological:     General: No focal deficit present.     Mental Status: She is alert. Mental status is at baseline.     Motor: No weakness.     Coordination: Coordination normal.     Gait: Gait abnormal.     Comments: Oriented to person, place.  Psychiatric:        Mood and Affect: Mood normal.        Behavior: Behavior normal.     Labs reviewed: Recent Labs    05/26/19 0906 05/26/19 0906 07/24/19 0000 07/24/19 0000 09/08/19 0000 12/15/19 0000 04/15/20 0000  NA 143  --  142   < > 144 141 138  K 4.1   < > 4.3   < > 3.5 3.9 3.8  CL 110  --  110  --   --  107 104  CO2 24  --  24  --   --  27* 25*  GLUCOSE 97  --   --   --   --   --   --   BUN 13  --  12   < > 10 16 26*  CREATININE 0.65  --  0.7   < > 0.7 0.7 1.3*  CALCIUM 9.0  --  9.0  --   --  8.6* 8.7   < > = values in this interval not displayed.   Recent Labs     07/14/19 0811 07/14/19 0811 07/24/19 0000 09/08/19 0000 04/15/20 0000  AST 21   < > 43* 23 19  ALT 7   < > 5* 12 3*  ALKPHOS 56   < > 0.4* 46 47  BILITOT 0.3  --   --   --   --   PROT 6.1  --  5.8  --   --   ALBUMIN 4.3  --  3.7  --  3.5   < > = values in this interval not displayed.   Recent Labs    05/26/19 0906 09/08/19 0000 02/03/20 0000 02/03/20 0000 02/12/20 0000 03/31/20 0000 04/15/20 0000  WBC 7.4   < > 7.8   < > 7.5 13.9 17.6  NEUTROABS 4,862  --  4,485  --  4,845  --   --   HGB 9.4*   < > 11.3*   < > 11.0* 10.9* 9.6*  HCT 29.8*   < > 34*   < > 33* 33* 29*  MCV 89.5  --   --   --   --   --   --   PLT 302   < > 337  --   --  267 173   < > = values in this interval not displayed.   Lab Results  Component Value Date   TSH 1.62 09/08/2019   No results found for: HGBA1C Lab Results  Component Value Date   CHOL 118 07/14/2019   HDL 50 07/14/2019   LDLCALC 32 07/14/2019   TRIG 182 (H) 07/14/2019   CHOLHDL 2.4 07/14/2019    Significant Diagnostic Results in last 30 days:  No results found.  Assessment/Plan: Fever of unknown origin Prn Tylenol is available to her, stat CBC/diff, CMP/eGFR, UA C/S, CXR, will empirical ABT given symptoms of dysuria, lower abd pain, nausea. Nitrofurantoin 134m bid x 7 days, Pyridium 102mtid x 2 days.   Dysuria Hx of recurrent UTI, dysuria, lower abd pain x 3 days, nauseated today, will empirical  Nitrofurantoin 10062mid x 7 days, Pyridium 100m39md x 2 days.    Erosive (osteo)arthritis Pain is better controlled, continue Tylenol, Prednisone, Plaqueni  Depression, psychotic (HCC)[place] mood is better controlled, continue Sertraline, Quetiapine.     Family/ staff Communication: plan of care reviewed with the patient and charge nurse.   Labs/tests ordered: CBC/diff, CMP/eGFR, CXR, UA C/S

## 2020-04-15 NOTE — Assessment & Plan Note (Addendum)
Prn Tylenol is available to her, stat CBC/diff, CMP/eGFR, UA C/S, CXR, will empirical ABT given symptoms of dysuria, lower abd pain, nausea. Nitrofurantoin 156m bid x 7 days, Pyridium 1059mtid x 2 days.

## 2020-04-18 ENCOUNTER — Encounter: Payer: Self-pay | Admitting: Nurse Practitioner

## 2020-04-18 ENCOUNTER — Non-Acute Institutional Stay (SKILLED_NURSING_FACILITY): Payer: Medicare Other | Admitting: Nurse Practitioner

## 2020-04-18 DIAGNOSIS — G20 Parkinson's disease: Secondary | ICD-10-CM

## 2020-04-18 DIAGNOSIS — N390 Urinary tract infection, site not specified: Secondary | ICD-10-CM | POA: Diagnosis not present

## 2020-04-18 DIAGNOSIS — D72829 Elevated white blood cell count, unspecified: Secondary | ICD-10-CM

## 2020-04-18 DIAGNOSIS — N179 Acute kidney failure, unspecified: Secondary | ICD-10-CM | POA: Diagnosis not present

## 2020-04-18 DIAGNOSIS — F323 Major depressive disorder, single episode, severe with psychotic features: Secondary | ICD-10-CM | POA: Diagnosis not present

## 2020-04-18 DIAGNOSIS — I5032 Chronic diastolic (congestive) heart failure: Secondary | ICD-10-CM

## 2020-04-18 DIAGNOSIS — M154 Erosive (osteo)arthritis: Secondary | ICD-10-CM | POA: Diagnosis not present

## 2020-04-18 NOTE — Assessment & Plan Note (Signed)
Trace edema BLE, compensated clinically CHF, hold Furosemide x 3 days in setting or limited oral intake. observe

## 2020-04-18 NOTE — Progress Notes (Signed)
Location:   [facility] Room Number: 3 Place of Service:  SNF (31) Provider: Lennie Odor Emrys Mceachron NP  [name], MD  Patient Care Team: [name], MD as PCP - General (Internal Medicine) [name], MD as PCP - Cardiology (Cardiology) [name], MD (Neurosurgery) [name], MD (Neurology) [name], MD (Inactive) as Consulting Physician (Gastroenterology) [name], MD as Consulting Physician (Urology) Shannette Tabares X, NP as Nurse Practitioner (Internal Medicine)  Extended Emergency Contact Information Primary Emergency Contact: [facility] Address: [address]          [place], [place] [address] [name] of [place] Phone: [phone] Mobile Phone: [phone] Relation: Spouse Secondary Emergency Contact: [name] Mobile Phone: [phone] Relation: Daughter Preferred language: [name] Interpreter needed? No  Code Status: DNR Goals of care: Advanced Directive information Advanced Directives 04/19/2020  Does Patient Have a Medical Advance Directive? No  Type of Advance Directive -  Does patient want to make changes to medical advance directive? -  Copy of [place] in Chart? -  Would patient like information on creating a medical advance directive? -  Pre-existing out of facility DNR order (yellow form or pink MOST form) -     Chief Complaint  Patient presents with  . Acute Visit    UTI    HPI:  Pt is a 86 y.o. female seen today for an acute visit for UTI, persisted dysuria,  leukocytosis, decreased oral intake/poor appetite.   Urine culture showed E. Coli>100,000c/ml, empirical Levaquin started in setting of dysuria and elevated wbc 17.6 04/15/20, no further fever or nausea, but dysuria persisted, her appetite remains poor since onset of UTI, 04/15/20 CMP showed elevated Bun/creat 26/1.30  Hx of edema, trace BLE/CHF, on Furosemide 65m qd. Anemia, Hgb 9-10, no active  bleeding, on Fe. Parkinson, slow movement, on Sinemet, MiraPex.  Psychosis/ depression, stable, on Quetiapine, Sertraline. Erosive arthritis, stable, on Prednisone, Plaquenil.        Past Medical History:  Diagnosis Date  . Anemia   . Anxiety   . Aortic stenosis    moderate by echo 2021   . Arthritis   . Broken arm    right   . Chronic diastolic CHF (congestive heart failure) ([place]   . Family history of adverse reaction to anesthesia    pt. states sister vomits  . Fibromyalgia   . Fracture of thumb 09/01/2015   right  . Fracture, foot 09/01/2015   right  . GERD (gastroesophageal reflux disease)   . H/O hiatal hernia   . History of bronchitis   . History of kidney stones   . Hypercholesterolemia   . Hypertension    dr t turner  . Hypothyroidism   . Neuropathy   . Osteoporosis   . Parkinson disease ([place]   . PVD (peripheral vascular disease) (HCC)    99% stenosis of left anteiror tibial artery, mod stenosis of left distal SFA and popliteal artery followed by Dr. FOneida Alar . RBBB    noted on EKG 2018  . Restless leg   . Shortness of breath    with exertion - chronic   . Sjogren's disease ([place]   . SOB (shortness of breath)    chronic due to diastolic dysfunction, deconditioning, obesity  . Tremor    Past Surgical History:  Procedure Laterality Date  . ABDOMINAL HYSTERECTOMY    . BACK SURGERY     4 back surgeries,   lumbar  fusion  . CARDIAC CATHETERIZATION  2006   normal  . CYSTOSCOPY/URETEROSCOPY/HOLMIUM LASER/STENT PLACEMENT Left 01/06/2019   Procedure: CYSTOSCOPY/RETROGRADE/URETEROSCOPY/HOLMIUM LASER/BASKET RETRIEVAL/STENT PLACEMENT;  Surgeon: Ceasar Mons, MD;  Location: [facility];  Service: Urology;  Laterality: Left;  . EYE SURGERY Bilateral    cateracts  . falls     variious fall, broken wrist,and toes  . FRACTURE SURGERY Right April 2016   Wrist, Pt. fell  . kidney stone removal Left 01/20/2019  . RIGHT/LEFT HEART CATH AND CORONARY ANGIOGRAPHY N/A  02/05/2018   Procedure: RIGHT/LEFT HEART CATH AND CORONARY ANGIOGRAPHY;  Surgeon: Troy Sine, MD;  Location: [facility];  Service: Cardiovascular;  Laterality: N/A;  . SPINAL CORD STIMULATOR INSERTION N/A 09/09/2015   Procedure: LUMBAR SPINAL CORD STIMULATOR INSERTION;  Surgeon: Clydell Hakim, MD;  Location: [facility];  Service: Neurosurgery;  Laterality: N/A;  LUMBAR SPINAL CORD STIMULATOR INSERTION  . SPINE SURGERY  April 2013   Back X's 4  . TOTAL HIP ARTHROPLASTY     right  . WRIST FRACTURE SURGERY Bilateral     Allergies  Allergen Reactions  . Adrenalone   . Lactose Other (See Comments)    abd pain, lactose intolerant  . Morphine Other (See Comments)  . Sulfa Antibiotics Other (See Comments)    Headache, very sick  . Codeine Other (See Comments)    Reaction:  Headaches and nightmares   . Lactose Intolerance (Gi) Nausea And Vomiting  . Latex Rash  . Lyrica [Pregabalin] Swelling and Other (See Comments)    Reaction:  Leg swelling  . Other Other (See Comments)    Pt states that pain medications give her nightmares.    . Plaquenil [Hydroxychloroquine Sulfate] Other (See Comments)    Reaction:  GI upset   . Reglan [Metoclopramide] Other (See Comments)    Reaction:  GI upset   . Requip [Ropinirole Hcl] Other (See Comments)    Reaction:  GI upset   . Septra [Sulfamethoxazole-Trimethoprim] Nausea And Vomiting  . Shellfish Allergy Nausea And Vomiting    Allergies as of 04/18/2020      Reactions   Adrenalone    Lactose Other (See Comments)   abd pain, lactose intolerant   Morphine Other (See Comments)   Sulfa Antibiotics Other (See Comments)   Headache, very sick   Codeine Other (See Comments)   Reaction:  Headaches and nightmares    Lactose Intolerance (gi) Nausea And Vomiting   Latex Rash   Lyrica [pregabalin] Swelling, Other (See Comments)   Reaction:  Leg swelling   Other Other (See Comments)   Pt states that pain medications give her nightmares.      Plaquenil [hydroxychloroquine Sulfate] Other (See Comments)   Reaction:  GI upset    Reglan [metoclopramide] Other (See Comments)   Reaction:  GI upset    Requip [ropinirole Hcl] Other (See Comments)   Reaction:  GI upset    Septra [sulfamethoxazole-trimethoprim] Nausea And Vomiting   Shellfish Allergy Nausea And Vomiting      Medication List       Accurate as of Apr 18, 2020 11:59 PM. If you have any questions, ask your nurse or doctor.        acetaminophen 500 MG tablet Commonly known as: TYLENOL Take 500 mg by mouth daily as needed for mild pain.   TYLENOL 500 MG tablet Generic drug: acetaminophen Take 500 mg by mouth 2 (two) times daily.   AeroChamber MV inhaler by Other route. Use as  instructed as needed   albuterol 108 (90 Base) MCG/ACT inhaler Commonly known as: VENTOLIN HFA Inhale 2 puffs into the lungs every 6 (six) hours as needed for wheezing or shortness of breath.   aspirin EC 81 MG tablet Take 81 mg by mouth daily.   Calcium 600/Vitamin D3 600-800 MG-UNIT Tabs Generic drug: Calcium Carb-Cholecalciferol Take 1 tablet by mouth daily.   carbidopa-levodopa 25-100 MG tablet Commonly known as: SINEMET IR Take 2 tablets by mouth in the morning and at bedtime.   clotrimazole-betamethasone cream Commonly known as: Lotrisone Apply 1 application topically 2 (two) times daily.   docusate sodium 100 MG capsule Commonly known as: COLACE Take 100 mg by mouth at bedtime.   eucerin cream Apply 1 application topically daily.   ezetimibe 10 MG tablet Commonly known as: ZETIA TAKE 1 TABLET ONCE DAILY.   furosemide 20 MG tablet Commonly known as: LASIX Take 20 mg by mouth daily.   hydrocortisone 2.5 % cream Apply 1 application topically 2 (two) times daily.   hydroxychloroquine 200 MG tablet Commonly known as: PLAQUENIL Take 200 mg by mouth daily.   lansoprazole 30 MG disintegrating tablet Commonly known as: PREVACID SOLUTAB Take 30 mg by mouth daily at  12 noon.   levothyroxine 50 MCG tablet Commonly known as: SYNTHROID Take 50 mcg by mouth daily before breakfast.   lubiprostone 24 MCG capsule Commonly known as: AMITIZA Take 24 mcg by mouth daily.   multivitamin with minerals Tabs tablet Take 1 tablet by mouth daily.   nitrofurantoin (macrocrystal-monohydrate) 100 MG capsule Commonly known as: MACROBID Take 100 mg by mouth 2 (two) times daily.   nystatin powder Commonly known as: MYCOSTATIN/NYSTOP Apply topically 2 (two) times daily as needed.   phenazopyridine 100 MG tablet Commonly known as: PYRIDIUM Take 100 mg by mouth in the morning and at bedtime.   potassium chloride 10 MEQ tablet Commonly known as: KLOR-CON Take 10 mEq by mouth daily.   pramipexole 1 MG tablet Commonly known as: MIRAPEX Take 1 mg by mouth 2 (two) times daily.   predniSONE 1 MG tablet Commonly known as: DELTASONE Take 3 mg by mouth daily with breakfast. 3 tabs x5 days   PreserVision AREDS 2 Chew Chew 1 tablet by mouth daily.   QUEtiapine 25 MG tablet Commonly known as: SEROQUEL Take 12.5-25 mg by mouth See admin instructions. Take 12.5 mg in Am and 25 mg in PM   rosuvastatin 20 MG tablet Commonly known as: CRESTOR Take 1 tablet (20 mg total) by mouth daily.   saccharomyces boulardii 250 MG capsule Commonly known as: FLORASTOR Take 250 mg by mouth 2 (two) times daily.   senna-docusate 8.6-50 MG tablet Commonly known as: Senokot-S Take 1 tablet by mouth at bedtime.   sertraline 50 MG tablet Commonly known as: ZOLOFT Take 75 mg by mouth daily. Take 1.5 tablets to = 75 mg   Slow Fe 142 (45 Fe) MG Tbcr Generic drug: Ferrous Sulfate Take 1 tablet by mouth. Daily   Systane 0.4-0.3 % Soln Generic drug: Polyethyl Glycol-Propyl Glycol Apply 1 drop to eye 2 (two) times daily as needed (dry eyes).   Vitamin D 50 MCG (2000 UT) Caps Take 2,000 Units by mouth daily.       Review of Systems  Constitutional: Positive for appetite  change and fatigue. Negative for activity change, chills, diaphoresis and fever.  HENT: Positive for hearing loss. Negative for congestion and voice change.   Eyes: Negative for visual disturbance.  Respiratory: Negative  for cough and shortness of breath.   Cardiovascular: Positive for leg swelling. Negative for palpitations.  Gastrointestinal: Negative for abdominal distention, abdominal pain, constipation, diarrhea, nausea and vomiting.  Genitourinary: Positive for dysuria. Negative for difficulty urinating and urgency.  Musculoskeletal: Positive for arthralgias, back pain, gait problem and myalgias.       Chronic lower back pain.   Skin: Negative for color change.  Neurological: Negative for dizziness, speech difficulty, weakness and headaches.       Memory lapses.   Psychiatric/Behavioral: Negative for agitation, behavioral problems, hallucinations and sleep disturbance. The patient is not nervous/anxious.     Immunization History  Administered Date(s) Administered  . Influenza Split 09/09/2013  . Influenza, High Dose Seasonal PF 09/12/2018, 09/15/2019  . Influenza,inj,Quad PF,6+ Mos 09/11/2016  . Moderna SARS-COVID-2 Vaccination 12/12/2019, 01/09/2020  . Pneumococcal Conjugate-13 04/15/2014  . Pneumococcal Polysaccharide-23 09/28/2005  . Tdap 09/01/2015  . Zoster Recombinat (Shingrix) 06/11/2018, 09/10/2018   Pertinent  Health Maintenance Due  Topic Date Due  . INFLUENZA VACCINE  07/10/2020  . DEXA SCAN  Completed  . PNA vac Low Risk Adult  Completed   Fall Risk  11/16/2019 04/23/2019 04/09/2019 10/29/2018 10/27/2018  Falls in the past year? 0 0 0 0 1  Comment - - - fell June 2019 -  Number falls in past yr: - 0 0 - 0  Injury with Fall? - 0 0 - 1  Comment - - - - -  Risk for fall due to : - - - - -  Risk for fall due to: Comment - - - - -   Functional Status Survey:    Vitals:   04/18/20 1545  BP: 107/69  Pulse: 72  Resp: 18  Temp: (!) 97.4 F (36.3 C)  SpO2: 95%    Weight: 144 lb 6.4 oz (65.5 kg)  Height: '5\' 2"'  (1.575 m)   Body mass index is 26.41 kg/m. Physical Exam Vitals and nursing note reviewed.  Constitutional:      Comments: Feel tired.   HENT:     Head: Normocephalic and atraumatic.     Mouth/Throat:     Mouth: Mucous membranes are moist.  Eyes:     Extraocular Movements: Extraocular movements intact.     Conjunctiva/sclera: Conjunctivae normal.     Pupils: Pupils are equal, round, and reactive to light.  Cardiovascular:     Rate and Rhythm: Normal rate and regular rhythm.     Heart sounds: Murmur present.  Pulmonary:     Breath sounds: No rhonchi or rales.  Abdominal:     General: Bowel sounds are normal. There is no distension.     Palpations: Abdomen is soft.     Tenderness: There is no abdominal tenderness. There is no right CVA tenderness, left CVA tenderness, guarding or rebound.  Musculoskeletal:        General: No swelling or tenderness.     Cervical back: Normal range of motion and neck supple.     Right lower leg: Edema present.     Left lower leg: Edema present.     Comments: Trace edema BLE.  Arthritic changes in fingers, R>L  Skin:    General: Skin is warm and dry.  Neurological:     General: No focal deficit present.     Mental Status: She is alert. Mental status is at baseline.     Motor: No weakness.     Coordination: Coordination normal.     Gait: Gait abnormal.  Comments: Oriented to person, place.   Psychiatric:        Mood and Affect: Mood normal.        Behavior: Behavior normal.     Labs reviewed: Recent Labs    05/26/19 0906 07/24/19 0000 12/15/19 0000 04/15/20 0000 04/19/20 0200  NA 143   < > 141 138 138  K 4.1   < > 3.9 3.8 3.4*  CL 110   < > 107 104 107  CO2 24   < > 27* 25* 19*  GLUCOSE 97  --   --   --  108*  BUN 13   < > 16 26* 29*  CREATININE 0.65   < > 0.7 1.3* 1.64*  CALCIUM 9.0   < > 8.6* 8.7 7.7*   < > = values in this interval not displayed.   Recent Labs     07/14/19 0811 07/14/19 0811 07/24/19 0000 07/24/19 0000 09/08/19 0000 04/15/20 0000 04/19/20 0200  AST 21   < > 43*   < > 23 19 53*  ALT 7   < > 5*   < > 12 3* 20  ALKPHOS 56   < > 0.4*   < > 46 47 99  BILITOT 0.3  --   --   --   --   --  0.6  PROT 6.1  --  5.8  --   --   --  5.4*  ALBUMIN 4.3  --  3.7  --   --  3.5 2.7*   < > = values in this interval not displayed.   Recent Labs    05/26/19 0906 09/08/19 0000 02/03/20 0000 02/03/20 0000 02/12/20 0000 02/12/20 0000 03/31/20 0000 04/15/20 0000 04/19/20 0200  WBC 7.4   < > 7.8   < > 7.5   < > 13.9 17.6 1.0*  NEUTROABS 4,862  --  4,485  --  4,845  --   --   --  0.7*  HGB 9.4*   < > 11.3*   < > 11.0*   < > 10.9* 9.6* 9.1*  HCT 29.8*   < > 34*   < > 33*   < > 33* 29* 29.5*  MCV 89.5  --   --   --   --   --   --   --  101.4*  PLT 302   < > 337   < >  --   --  267 173 131*   < > = values in this interval not displayed.   Lab Results  Component Value Date   TSH 1.62 09/08/2019   No results found for: HGBA1C Lab Results  Component Value Date   CHOL 118 07/14/2019   HDL 50 07/14/2019   LDLCALC 32 07/14/2019   TRIG 182 (H) 07/14/2019   CHOLHDL 2.4 07/14/2019    Significant Diagnostic Results in last 30 days:  DG Chest Port 1 View  Result Date: 04/19/2020 CLINICAL DATA:  Shortness of breath and fever EXAM: PORTABLE CHEST 1 VIEW COMPARISON:  Chest radiograph 01/15/2019 FINDINGS: The heart size and mediastinal contours are within normal limits. Both lungs are clear. The visualized skeletal structures are unremarkable. Spinal stimulator leads overlie the midthoracic spine. IMPRESSION: No active disease. Electronically Signed   By: Ulyses Jarred M.D.   On: 04/19/2020 02:35   CT RENAL STONE STUDY  Result Date: 04/19/2020 CLINICAL DATA:  Severe sepsis.  Rule out hydronephrosis EXAM: CT ABDOMEN AND PELVIS WITHOUT CONTRAST TECHNIQUE:  Multidetector CT imaging of the abdomen and pelvis was performed following the standard protocol  without IV contrast. COMPARISON:  01/02/2019 FINDINGS: Lower chest: Trace pleural effusions and lower lobe atelectasis. Extensive coronary atherosclerosis. Small hiatal hernia. Hepatobiliary: No focal liver abnormality.No evidence of biliary obstruction or stone. Pancreas: Mild generalized atrophy. Spleen: Unremarkable. Adrenals/Urinary Tract: Negative adrenals. Right hydroureteronephrosis due to a obstructing 11 x 7 mm stone at the pelvic inlet. Irregular 7 mm interpolar calculus in the left kidney. Two small lower pole calculi on the right. Unremarkable bladder. Stomach/Bowel:  No obstruction. Moderate rectal stool. Vascular/Lymphatic: Atherosclerosis no acute vascular abnormality. No mass or adenopathy. Reproductive:Hysterectomy Other: No ascites or pneumoperitoneum. Musculoskeletal: Lumbar fusion from L2-S1. Severe adjacent segment degeneration at T12-L1 and especially L1-2 where there is retrolisthesis and spinal/foraminal impingement. A spinal stimulator is present. IMPRESSION: 1. Obstructing 11 x 7 mm stone in the right ureter at the level of the pelvic inlet. 2. Bilateral nephrolithiasis. 3. Incidental findings are noted above. Electronically Signed   By: Monte Fantasia M.D.   On: 04/19/2020 06:29    Assessment/Plan: Recurrent UTI 5/7/21urine culture E Coli, >100,000c/ml S-Nitrofurantoin,I-Levofloxacin, will dc Empirical Levaquin since persisted dysuria, the patient has no s/s of sepsis,  will treat with Nitrofurantoin 152m bid x 7 days, 04/15/20 wbc 17.6,(03/31/20 wbc 13.9),  Hgb 9.6,  f/u CBC/diff one week  Leukocytosis Wbc 13.9 03/31/20, wbc 17.6 04/15/20, Prednisone is contributory to her baseline leukocytosis, acute UTI superimposed elevated white blood cells. Repeat CBC/diff in one week  Acute kidney injury ([place] 04/15/20 wbc 17.6, Hgb 9.6, plt 173, Na 138, K 3.8, Bun 26, creat 1.30, eGFR 37, CXR pulmonary venous congestion.  04/18/20 poor appetite since on set of UTI, no nausea, vomiting, or  diarrhea, hold Furosemide, Kcl x 3 days, repeat CMP/eGFR 1 week.   Chronic diastolic CHF (congestive heart failure) (HCC) Trace edema BLE, compensated clinically CHF, hold Furosemide x 3 days in setting or limited oral intake. observe  Parkinson's disease Moves slow, continue Sinemet, MiraPex  Erosive (osteo)arthritis F/u Rheumatology, RA <14, continue Prednisone, Plaquenil.   Depression, psychotic ([place] Her mood is stable, continue Sertraline, Quetiapine.     Family/ staff Communication: plan of care reviewed with the patient and charge nurse.   Labs/tests ordered:  CBC/diff, CMP/eGFR 1 week  Time spend 25 minutes.

## 2020-04-18 NOTE — Assessment & Plan Note (Signed)
Her mood is stable, continue Sertraline, Quetiapine.  

## 2020-04-18 NOTE — Assessment & Plan Note (Signed)
Wbc 13.9 03/31/20, wbc 17.6 04/15/20, Prednisone is contributory to her baseline leukocytosis, acute UTI superimposed elevated white blood cells. Repeat CBC/diff in one week

## 2020-04-18 NOTE — Assessment & Plan Note (Signed)
04/15/20 wbc 17.6, Hgb 9.6, plt 173, Na 138, K 3.8, Bun 26, creat 1.30, eGFR 37, CXR pulmonary venous congestion.  04/18/20 poor appetite since on set of UTI, no nausea, vomiting, or diarrhea, hold Furosemide, Kcl x 3 days, repeat CMP/eGFR 1 week.  

## 2020-04-18 NOTE — Assessment & Plan Note (Signed)
5/7/21urine culture E Coli, >100,000c/ml S-Nitrofurantoin,I-Levofloxacin, will dc Empirical Levaquin since persisted dysuria, the patient has no s/s of sepsis,  will treat with Nitrofurantoin 100mg  bid x 7 days, 04/15/20 wbc 17.6,(03/31/20 wbc 13.9),  Hgb 9.6,  f/u CBC/diff one week

## 2020-04-18 NOTE — Assessment & Plan Note (Signed)
F/u Rheumatology, RA <14, continue Prednisone, Plaquenil.

## 2020-04-18 NOTE — Assessment & Plan Note (Signed)
Moves slow, continue Sinemet, MiraPex

## 2020-04-19 ENCOUNTER — Inpatient Hospital Stay (HOSPITAL_COMMUNITY): Payer: Medicare Other

## 2020-04-19 ENCOUNTER — Encounter: Payer: Self-pay | Admitting: Nurse Practitioner

## 2020-04-19 ENCOUNTER — Emergency Department (HOSPITAL_COMMUNITY): Payer: Medicare Other

## 2020-04-19 ENCOUNTER — Encounter (HOSPITAL_COMMUNITY): Payer: Self-pay

## 2020-04-19 ENCOUNTER — Other Ambulatory Visit: Payer: Self-pay

## 2020-04-19 ENCOUNTER — Inpatient Hospital Stay (HOSPITAL_COMMUNITY)
Admission: EM | Admit: 2020-04-19 | Discharge: 2020-04-25 | DRG: 871 | Disposition: A | Discharge status: Skilled Nursing Facility | Payer: Medicare Other | Source: Skilled Nursing Facility | Attending: Internal Medicine | Admitting: Internal Medicine

## 2020-04-19 DIAGNOSIS — Z7952 Long term (current) use of systemic steroids: Secondary | ICD-10-CM | POA: Diagnosis not present

## 2020-04-19 DIAGNOSIS — I248 Other forms of acute ischemic heart disease: Secondary | ICD-10-CM | POA: Diagnosis not present

## 2020-04-19 DIAGNOSIS — E876 Hypokalemia: Secondary | ICD-10-CM | POA: Diagnosis present

## 2020-04-19 DIAGNOSIS — R000 Tachycardia, unspecified: Secondary | ICD-10-CM | POA: Diagnosis not present

## 2020-04-19 DIAGNOSIS — Z79890 Hormone replacement therapy (postmenopausal): Secondary | ICD-10-CM | POA: Diagnosis not present

## 2020-04-19 DIAGNOSIS — G20 Parkinson's disease: Secondary | ICD-10-CM | POA: Diagnosis present

## 2020-04-19 DIAGNOSIS — R0902 Hypoxemia: Secondary | ICD-10-CM | POA: Diagnosis not present

## 2020-04-19 DIAGNOSIS — G8929 Other chronic pain: Secondary | ICD-10-CM | POA: Diagnosis not present

## 2020-04-19 DIAGNOSIS — E739 Lactose intolerance, unspecified: Secondary | ICD-10-CM | POA: Diagnosis present

## 2020-04-19 DIAGNOSIS — N1330 Unspecified hydronephrosis: Secondary | ICD-10-CM | POA: Diagnosis not present

## 2020-04-19 DIAGNOSIS — F419 Anxiety disorder, unspecified: Secondary | ICD-10-CM | POA: Diagnosis not present

## 2020-04-19 DIAGNOSIS — N132 Hydronephrosis with renal and ureteral calculous obstruction: Secondary | ICD-10-CM | POA: Diagnosis not present

## 2020-04-19 DIAGNOSIS — R0689 Other abnormalities of breathing: Secondary | ICD-10-CM | POA: Diagnosis not present

## 2020-04-19 DIAGNOSIS — R52 Pain, unspecified: Secondary | ICD-10-CM | POA: Diagnosis not present

## 2020-04-19 DIAGNOSIS — Z7982 Long term (current) use of aspirin: Secondary | ICD-10-CM

## 2020-04-19 DIAGNOSIS — I110 Hypertensive heart disease with heart failure: Secondary | ICD-10-CM | POA: Diagnosis present

## 2020-04-19 DIAGNOSIS — Z66 Do not resuscitate: Secondary | ICD-10-CM | POA: Diagnosis present

## 2020-04-19 DIAGNOSIS — R6521 Severe sepsis with septic shock: Secondary | ICD-10-CM | POA: Diagnosis present

## 2020-04-19 DIAGNOSIS — A419 Sepsis, unspecified organism: Secondary | ICD-10-CM | POA: Diagnosis not present

## 2020-04-19 DIAGNOSIS — K5909 Other constipation: Secondary | ICD-10-CM | POA: Diagnosis not present

## 2020-04-19 DIAGNOSIS — R061 Stridor: Secondary | ICD-10-CM

## 2020-04-19 DIAGNOSIS — G2581 Restless legs syndrome: Secondary | ICD-10-CM | POA: Diagnosis not present

## 2020-04-19 DIAGNOSIS — Z20822 Contact with and (suspected) exposure to covid-19: Secondary | ICD-10-CM | POA: Diagnosis present

## 2020-04-19 DIAGNOSIS — I5032 Chronic diastolic (congestive) heart failure: Secondary | ICD-10-CM | POA: Diagnosis present

## 2020-04-19 DIAGNOSIS — D649 Anemia, unspecified: Secondary | ICD-10-CM | POA: Diagnosis present

## 2020-04-19 DIAGNOSIS — M797 Fibromyalgia: Secondary | ICD-10-CM | POA: Diagnosis present

## 2020-04-19 DIAGNOSIS — I350 Nonrheumatic aortic (valve) stenosis: Secondary | ICD-10-CM | POA: Diagnosis not present

## 2020-04-19 DIAGNOSIS — K219 Gastro-esophageal reflux disease without esophagitis: Secondary | ICD-10-CM | POA: Diagnosis not present

## 2020-04-19 DIAGNOSIS — M154 Erosive (osteo)arthritis: Secondary | ICD-10-CM | POA: Diagnosis not present

## 2020-04-19 DIAGNOSIS — I959 Hypotension, unspecified: Secondary | ICD-10-CM | POA: Diagnosis not present

## 2020-04-19 DIAGNOSIS — R41841 Cognitive communication deficit: Secondary | ICD-10-CM | POA: Diagnosis not present

## 2020-04-19 DIAGNOSIS — Z8249 Family history of ischemic heart disease and other diseases of the circulatory system: Secondary | ICD-10-CM | POA: Diagnosis not present

## 2020-04-19 DIAGNOSIS — E02 Subclinical iodine-deficiency hypothyroidism: Secondary | ICD-10-CM | POA: Diagnosis not present

## 2020-04-19 DIAGNOSIS — M810 Age-related osteoporosis without current pathological fracture: Secondary | ICD-10-CM | POA: Diagnosis present

## 2020-04-19 DIAGNOSIS — I739 Peripheral vascular disease, unspecified: Secondary | ICD-10-CM | POA: Diagnosis present

## 2020-04-19 DIAGNOSIS — Z96641 Presence of right artificial hip joint: Secondary | ICD-10-CM | POA: Diagnosis present

## 2020-04-19 DIAGNOSIS — R2681 Unsteadiness on feet: Secondary | ICD-10-CM | POA: Diagnosis not present

## 2020-04-19 DIAGNOSIS — N136 Pyonephrosis: Secondary | ICD-10-CM | POA: Diagnosis present

## 2020-04-19 DIAGNOSIS — Z91013 Allergy to seafood: Secondary | ICD-10-CM

## 2020-04-19 DIAGNOSIS — R278 Other lack of coordination: Secondary | ICD-10-CM | POA: Diagnosis not present

## 2020-04-19 DIAGNOSIS — D72829 Elevated white blood cell count, unspecified: Secondary | ICD-10-CM | POA: Diagnosis not present

## 2020-04-19 DIAGNOSIS — E785 Hyperlipidemia, unspecified: Secondary | ICD-10-CM | POA: Diagnosis present

## 2020-04-19 DIAGNOSIS — N201 Calculus of ureter: Secondary | ICD-10-CM | POA: Diagnosis not present

## 2020-04-19 DIAGNOSIS — F323 Major depressive disorder, single episode, severe with psychotic features: Secondary | ICD-10-CM | POA: Diagnosis not present

## 2020-04-19 DIAGNOSIS — I340 Nonrheumatic mitral (valve) insufficiency: Secondary | ICD-10-CM | POA: Diagnosis not present

## 2020-04-19 DIAGNOSIS — E039 Hypothyroidism, unspecified: Secondary | ICD-10-CM | POA: Diagnosis present

## 2020-04-19 DIAGNOSIS — Z90710 Acquired absence of both cervix and uterus: Secondary | ICD-10-CM

## 2020-04-19 DIAGNOSIS — R441 Visual hallucinations: Secondary | ICD-10-CM | POA: Diagnosis not present

## 2020-04-19 DIAGNOSIS — R0602 Shortness of breath: Secondary | ICD-10-CM | POA: Diagnosis not present

## 2020-04-19 DIAGNOSIS — I469 Cardiac arrest, cause unspecified: Secondary | ICD-10-CM | POA: Diagnosis not present

## 2020-04-19 DIAGNOSIS — F0280 Dementia in other diseases classified elsewhere without behavioral disturbance: Secondary | ICD-10-CM | POA: Diagnosis present

## 2020-04-19 DIAGNOSIS — N179 Acute kidney failure, unspecified: Secondary | ICD-10-CM | POA: Diagnosis not present

## 2020-04-19 DIAGNOSIS — A4151 Sepsis due to Escherichia coli [E. coli]: Principal | ICD-10-CM | POA: Diagnosis present

## 2020-04-19 DIAGNOSIS — E7800 Pure hypercholesterolemia, unspecified: Secondary | ICD-10-CM | POA: Diagnosis present

## 2020-04-19 DIAGNOSIS — R509 Fever, unspecified: Secondary | ICD-10-CM | POA: Diagnosis not present

## 2020-04-19 DIAGNOSIS — N390 Urinary tract infection, site not specified: Secondary | ICD-10-CM | POA: Diagnosis present

## 2020-04-19 DIAGNOSIS — R410 Disorientation, unspecified: Secondary | ICD-10-CM | POA: Diagnosis not present

## 2020-04-19 DIAGNOSIS — Z79899 Other long term (current) drug therapy: Secondary | ICD-10-CM | POA: Diagnosis not present

## 2020-04-19 DIAGNOSIS — N202 Calculus of kidney with calculus of ureter: Secondary | ICD-10-CM | POA: Diagnosis not present

## 2020-04-19 DIAGNOSIS — K5900 Constipation, unspecified: Secondary | ICD-10-CM | POA: Diagnosis present

## 2020-04-19 DIAGNOSIS — D500 Iron deficiency anemia secondary to blood loss (chronic): Secondary | ICD-10-CM | POA: Diagnosis not present

## 2020-04-19 DIAGNOSIS — N200 Calculus of kidney: Secondary | ICD-10-CM | POA: Diagnosis not present

## 2020-04-19 DIAGNOSIS — M3500 Sicca syndrome, unspecified: Secondary | ICD-10-CM | POA: Diagnosis present

## 2020-04-19 DIAGNOSIS — Z7401 Bed confinement status: Secondary | ICD-10-CM | POA: Diagnosis not present

## 2020-04-19 DIAGNOSIS — M2550 Pain in unspecified joint: Secondary | ICD-10-CM | POA: Diagnosis not present

## 2020-04-19 DIAGNOSIS — R6520 Severe sepsis without septic shock: Secondary | ICD-10-CM | POA: Diagnosis not present

## 2020-04-19 DIAGNOSIS — M6281 Muscle weakness (generalized): Secondary | ICD-10-CM | POA: Diagnosis not present

## 2020-04-19 DIAGNOSIS — R5381 Other malaise: Secondary | ICD-10-CM | POA: Diagnosis not present

## 2020-04-19 DIAGNOSIS — I10 Essential (primary) hypertension: Secondary | ICD-10-CM | POA: Diagnosis not present

## 2020-04-19 DIAGNOSIS — R279 Unspecified lack of coordination: Secondary | ICD-10-CM | POA: Diagnosis not present

## 2020-04-19 HISTORY — DX: Sepsis, unspecified organism: A41.9

## 2020-04-19 HISTORY — PX: IR NEPHROSTOMY PLACEMENT RIGHT: IMG6064

## 2020-04-19 LAB — PROCALCITONIN: Procalcitonin: 45.94 ng/mL

## 2020-04-19 LAB — COMPREHENSIVE METABOLIC PANEL
ALT: 20 U/L (ref 0–44)
ALT: 77 U/L — ABNORMAL HIGH (ref 0–44)
AST: 53 U/L — ABNORMAL HIGH (ref 15–41)
AST: 217 U/L — ABNORMAL HIGH (ref 15–41)
Albumin: 2.7 g/dL — ABNORMAL LOW (ref 3.5–5.0)
Albumin: 2.3 g/dL — ABNORMAL LOW (ref 3.5–5.0)
Alkaline Phosphatase: 99 U/L (ref 38–126)
Alkaline Phosphatase: 109 U/L (ref 38–126)
Anion gap: 12 (ref 5–15)
Anion gap: 11 (ref 5–15)
BUN: 29 mg/dL — ABNORMAL HIGH (ref 8–23)
BUN: 26 mg/dL — ABNORMAL HIGH (ref 8–23)
CO2: 19 mmol/L — ABNORMAL LOW (ref 22–32)
CO2: 17 mmol/L — ABNORMAL LOW (ref 22–32)
Calcium: 7.7 mg/dL — ABNORMAL LOW (ref 8.9–10.3)
Calcium: 6.8 mg/dL — ABNORMAL LOW (ref 8.9–10.3)
Chloride: 107 mmol/L (ref 98–111)
Chloride: 114 mmol/L — ABNORMAL HIGH (ref 98–111)
Creatinine, Ser: 1.64 mg/dL — ABNORMAL HIGH (ref 0.44–1.00)
Creatinine, Ser: 1.63 mg/dL — ABNORMAL HIGH (ref 0.44–1.00)
GFR calc Af Amer: 32 mL/min — ABNORMAL LOW (ref >60)
GFR calc Af Amer: 33 mL/min — ABNORMAL LOW (ref >60)
GFR calc non Af Amer: 28 mL/min — ABNORMAL LOW (ref >60)
GFR calc non Af Amer: 28 mL/min — ABNORMAL LOW (ref >60)
Glucose, Bld: 108 mg/dL — ABNORMAL HIGH (ref 70–99)
Glucose, Bld: 102 mg/dL — ABNORMAL HIGH (ref 70–99)
Potassium: 3.4 mmol/L — ABNORMAL LOW (ref 3.5–5.1)
Potassium: 3.7 mmol/L (ref 3.5–5.1)
Sodium: 138 mmol/L (ref 135–145)
Sodium: 142 mmol/L (ref 135–145)
Total Bilirubin: 0.6 mg/dL (ref 0.3–1.2)
Total Bilirubin: 0.5 mg/dL (ref 0.3–1.2)
Total Protein: 5.4 g/dL — ABNORMAL LOW (ref 6.5–8.1)
Total Protein: 5.1 g/dL — ABNORMAL LOW (ref 6.5–8.1)

## 2020-04-19 LAB — LACTIC ACID, PLASMA
Lactic Acid, Venous: 3.1 mmol/L (ref 0.5–1.9)
Lactic Acid, Venous: 2.8 mmol/L (ref 0.5–1.9)
Lactic Acid, Venous: 2 mmol/L (ref 0.5–1.9)

## 2020-04-19 LAB — CBC WITH DIFFERENTIAL/PLATELET
Abs Immature Granulocytes: 0.05 10*3/uL (ref 0.00–0.07)
Basophils Absolute: 0 10*3/uL (ref 0.0–0.1)
Basophils Relative: 0 %
Eosinophils Absolute: 0 10*3/uL (ref 0.0–0.5)
Eosinophils Relative: 1 %
HCT: 29.5 % — ABNORMAL LOW (ref 36.0–46.0)
Hemoglobin: 9.1 g/dL — ABNORMAL LOW (ref 12.0–15.0)
Immature Granulocytes: 5 %
Lymphocytes Relative: 19 %
Lymphs Abs: 0.2 10*3/uL — ABNORMAL LOW (ref 0.7–4.0)
MCH: 31.3 pg (ref 26.0–34.0)
MCHC: 30.8 g/dL (ref 30.0–36.0)
MCV: 101.4 fL — ABNORMAL HIGH (ref 80.0–100.0)
Monocytes Absolute: 0 10*3/uL — ABNORMAL LOW (ref 0.1–1.0)
Monocytes Relative: 1 %
Neutro Abs: 0.7 10*3/uL — ABNORMAL LOW (ref 1.7–7.7)
Neutrophils Relative %: 74 %
Platelets: 131 10*3/uL — ABNORMAL LOW (ref 150–400)
RBC: 2.91 MIL/uL — ABNORMAL LOW (ref 3.87–5.11)
RDW: 13.9 % (ref 11.5–15.5)
WBC: 1 10*3/uL — CL (ref 4.0–10.5)
nRBC: 0 % (ref 0.0–0.2)

## 2020-04-19 LAB — CBC
HCT: 25.3 % — ABNORMAL LOW (ref 36.0–46.0)
Hemoglobin: 8 g/dL — ABNORMAL LOW (ref 12.0–15.0)
MCH: 31.5 pg (ref 26.0–34.0)
MCHC: 31.6 g/dL (ref 30.0–36.0)
MCV: 99.6 fL (ref 80.0–100.0)
Platelets: 163 10*3/uL (ref 150–400)
RBC: 2.54 MIL/uL — ABNORMAL LOW (ref 3.87–5.11)
RDW: 14.5 % (ref 11.5–15.5)
WBC: 24.2 10*3/uL — ABNORMAL HIGH (ref 4.0–10.5)
nRBC: 0 % (ref 0.0–0.2)

## 2020-04-19 LAB — URINALYSIS, ROUTINE W REFLEX MICROSCOPIC
Bilirubin Urine: NEGATIVE
Glucose, UA: NEGATIVE mg/dL
Ketones, ur: NEGATIVE mg/dL
Nitrite: POSITIVE — AB
Protein, ur: 30 mg/dL — AB
RBC / HPF: >50 RBC/hpf — ABNORMAL HIGH (ref 0–5)
Specific Gravity, Urine: 1.013 (ref 1.005–1.030)
WBC, UA: >50 WBC/hpf — ABNORMAL HIGH (ref 0–5)
pH: 5 (ref 5.0–8.0)

## 2020-04-19 LAB — PROTIME-INR
INR: 1.3 — ABNORMAL HIGH (ref 0.8–1.2)
Prothrombin Time: 15.4 seconds — ABNORMAL HIGH (ref 11.4–15.2)

## 2020-04-19 LAB — SARS CORONAVIRUS 2 BY RT PCR (HOSPITAL ORDER, PERFORMED IN ~~LOC~~ HOSPITAL LAB): SARS Coronavirus 2: NEGATIVE

## 2020-04-19 LAB — APTT: aPTT: 29 seconds (ref 24–36)

## 2020-04-19 LAB — CORTISOL-AM, BLOOD: Cortisol - AM: 47 ug/dL — ABNORMAL HIGH (ref 6.7–22.6)

## 2020-04-19 LAB — MRSA PCR SCREENING: MRSA by PCR: NEGATIVE

## 2020-04-19 LAB — CBG MONITORING, ED: Glucose-Capillary: 94 mg/dL (ref 70–99)

## 2020-04-19 MED ORDER — POTASSIUM CHLORIDE 20 MEQ/15ML (10%) PO SOLN: 20.0000 meq | Freq: Once | ORAL | Status: DC

## 2020-04-19 MED ORDER — QUETIAPINE FUMARATE 25 MG PO TABS
25.0000 mg | ORAL_TABLET | Freq: Every day | ORAL | Status: DC
  Administered 2020-04-19 – 2020-04-24 (×6): 25 mg via ORAL
  Filled 2020-04-19 (×6): qty 1

## 2020-04-19 MED ORDER — SODIUM CHLORIDE 0.9 % IV SOLN
1.0000 g | INTRAVENOUS | Status: DC
  Administered 2020-04-19: 1 g via INTRAVENOUS
  Filled 2020-04-19: qty 10

## 2020-04-19 MED ORDER — VITAMIN D3 25 MCG (1000 UNIT) PO TABS
2000.0000 [IU] | ORAL_TABLET | Freq: Every day | ORAL | Status: DC
  Administered 2020-04-21 – 2020-04-25 (×5): 2000 [IU] via ORAL
  Filled 2020-04-19 (×6): qty 2

## 2020-04-19 MED ORDER — ACETAMINOPHEN 500 MG PO TABS
1000.0000 mg | ORAL_TABLET | Freq: Once | ORAL | Status: AC
  Administered 2020-04-19: 1000 mg via ORAL
  Filled 2020-04-19: qty 2

## 2020-04-19 MED ORDER — SODIUM CHLORIDE 0.9 % IV SOLN
2.0000 g | INTRAVENOUS | Status: DC
  Administered 2020-04-20 – 2020-04-21 (×2): 2 g via INTRAVENOUS
  Filled 2020-04-19 (×3): qty 20

## 2020-04-19 MED ORDER — LANSOPRAZOLE 30 MG PO TBDD: 30.0000 mg | DELAYED_RELEASE_TABLET | Freq: Every day | ORAL | Status: DC

## 2020-04-19 MED ORDER — ONDANSETRON HCL 4 MG/2ML IJ SOLN: 4.0000 mg | Freq: Four times a day (QID) | INTRAMUSCULAR | Status: DC | PRN

## 2020-04-19 MED ORDER — HYDROCORTISONE NA SUCCINATE PF 100 MG IJ SOLR
50.0000 mg | Freq: Three times a day (TID) | INTRAMUSCULAR | Status: DC
  Administered 2020-04-19 – 2020-04-21 (×6): 50 mg via INTRAVENOUS
  Filled 2020-04-19 (×5): qty 2

## 2020-04-19 MED ORDER — LACTATED RINGERS IV BOLUS
1000.0000 mL | Freq: Once | INTRAVENOUS | Status: AC
  Administered 2020-04-19: 1000 mL via INTRAVENOUS

## 2020-04-19 MED ORDER — POLYETHYL GLYCOL-PROPYL GLYCOL 0.4-0.3 % OP SOLN: 1.0000 [drp] | Freq: Two times a day (BID) | OPHTHALMIC | Status: DC | PRN

## 2020-04-19 MED ORDER — LIDOCAINE HCL (PF) 1 % IJ SOLN
INTRAMUSCULAR | Status: AC | PRN
  Administered 2020-04-19: 5 mL

## 2020-04-19 MED ORDER — MIDAZOLAM HCL 2 MG/2ML IJ SOLN
INTRAMUSCULAR | Status: AC
  Filled 2020-04-19: qty 2

## 2020-04-19 MED ORDER — POLYVINYL ALCOHOL 1.4 % OP SOLN
1.0000 [drp] | Freq: Two times a day (BID) | OPHTHALMIC | Status: DC | PRN
  Filled 2020-04-19: qty 15

## 2020-04-19 MED ORDER — PRAMIPEXOLE DIHYDROCHLORIDE 1 MG PO TABS
1.0000 mg | ORAL_TABLET | Freq: Two times a day (BID) | ORAL | Status: DC
  Administered 2020-04-19 – 2020-04-25 (×12): 1 mg via ORAL
  Filled 2020-04-19 (×13): qty 1

## 2020-04-19 MED ORDER — ACETAMINOPHEN 650 MG RE SUPP: 650.0000 mg | Freq: Four times a day (QID) | RECTAL | Status: DC | PRN

## 2020-04-19 MED ORDER — ALBUTEROL SULFATE (2.5 MG/3ML) 0.083% IN NEBU
2.5000 mg | INHALATION_SOLUTION | Freq: Four times a day (QID) | RESPIRATORY_TRACT | Status: DC | PRN
  Administered 2020-04-19 – 2020-04-22 (×6): 2.5 mg via RESPIRATORY_TRACT
  Filled 2020-04-19 (×6): qty 3

## 2020-04-19 MED ORDER — ASPIRIN EC 81 MG PO TBEC
81.0000 mg | DELAYED_RELEASE_TABLET | Freq: Every day | ORAL | Status: DC
  Administered 2020-04-20 – 2020-04-25 (×6): 81 mg via ORAL
  Filled 2020-04-19 (×6): qty 1

## 2020-04-19 MED ORDER — ACETAMINOPHEN 325 MG PO TABS
650.0000 mg | ORAL_TABLET | Freq: Four times a day (QID) | ORAL | Status: DC | PRN
  Administered 2020-04-19 – 2020-04-22 (×2): 650 mg via ORAL
  Filled 2020-04-19 (×3): qty 2

## 2020-04-19 MED ORDER — ADULT MULTIVITAMIN W/MINERALS CH
1.0000 | ORAL_TABLET | Freq: Every day | ORAL | Status: DC
  Administered 2020-04-22 – 2020-04-25 (×4): 1 via ORAL
  Filled 2020-04-19 (×5): qty 1

## 2020-04-19 MED ORDER — SERTRALINE HCL 50 MG PO TABS
75.0000 mg | ORAL_TABLET | Freq: Every day | ORAL | Status: DC
  Administered 2020-04-19 – 2020-04-24 (×6): 75 mg via ORAL
  Filled 2020-04-19: qty 2
  Filled 2020-04-19 (×2): qty 1
  Filled 2020-04-19: qty 2
  Filled 2020-04-19 (×2): qty 1

## 2020-04-19 MED ORDER — FENTANYL CITRATE (PF) 100 MCG/2ML IJ SOLN
INTRAMUSCULAR | Status: AC
  Filled 2020-04-19: qty 2

## 2020-04-19 MED ORDER — DOCUSATE SODIUM 100 MG PO CAPS
100.0000 mg | ORAL_CAPSULE | Freq: Every day | ORAL | Status: DC
  Administered 2020-04-19 – 2020-04-24 (×7): 100 mg via ORAL
  Filled 2020-04-19 (×5): qty 1

## 2020-04-19 MED ORDER — SACCHAROMYCES BOULARDII 250 MG PO CAPS
250.0000 mg | ORAL_CAPSULE | Freq: Two times a day (BID) | ORAL | Status: DC
  Administered 2020-04-19 – 2020-04-25 (×11): 250 mg via ORAL
  Filled 2020-04-19 (×14): qty 1

## 2020-04-19 MED ORDER — IOHEXOL 300 MG/ML  SOLN
50.0000 mL | Freq: Once | INTRAMUSCULAR | Status: AC | PRN
  Administered 2020-04-19: 10 mL

## 2020-04-19 MED ORDER — CALCIUM CARBONATE-VITAMIN D 500-200 MG-UNIT PO TABS
1.0000 | ORAL_TABLET | Freq: Every day | ORAL | Status: DC
  Administered 2020-04-20: 1 via ORAL
  Filled 2020-04-19: qty 1

## 2020-04-19 MED ORDER — ENOXAPARIN SODIUM 30 MG/0.3ML ~~LOC~~ SOLN: 30.0000 mg | SUBCUTANEOUS | Status: DC

## 2020-04-19 MED ORDER — PHENYLEPHRINE HCL-NACL 10-0.9 MG/250ML-% IV SOLN
25.0000 ug/min | INTRAVENOUS | Status: DC
  Administered 2020-04-19 (×2): 95 ug/min via INTRAVENOUS
  Administered 2020-04-19: 25 ug/min via INTRAVENOUS
  Filled 2020-04-19 (×4): qty 250

## 2020-04-19 MED ORDER — SODIUM CHLORIDE 0.9 % IV SOLN: INTRAVENOUS | Status: DC

## 2020-04-19 MED ORDER — CARBIDOPA-LEVODOPA 25-100 MG PO TABS
2.0000 | ORAL_TABLET | Freq: Two times a day (BID) | ORAL | Status: DC
  Administered 2020-04-19 – 2020-04-25 (×12): 2 via ORAL
  Filled 2020-04-19 (×14): qty 2

## 2020-04-19 MED ORDER — MIDAZOLAM HCL 2 MG/2ML IJ SOLN
INTRAMUSCULAR | Status: AC | PRN
  Administered 2020-04-19: 0.5 mg via INTRAVENOUS

## 2020-04-19 MED ORDER — FUROSEMIDE 10 MG/ML IJ SOLN
20.0000 mg | Freq: Once | INTRAMUSCULAR | Status: AC
  Administered 2020-04-19: 18:00:00 20 mg via INTRAVENOUS
  Filled 2020-04-19: qty 2

## 2020-04-19 MED ORDER — LIDOCAINE HCL 1 % IJ SOLN
INTRAMUSCULAR | Status: AC
  Filled 2020-04-19: qty 20

## 2020-04-19 MED ORDER — SODIUM CHLORIDE 0.9 % IV SOLN
250.0000 mL | INTRAVENOUS | Status: DC
  Administered 2020-04-19: 250 mL via INTRAVENOUS

## 2020-04-19 MED ORDER — POTASSIUM CHLORIDE CRYS ER 20 MEQ PO TBCR
20.0000 meq | EXTENDED_RELEASE_TABLET | Freq: Once | ORAL | Status: AC
  Administered 2020-04-19: 20 meq via ORAL
  Filled 2020-04-19: qty 1

## 2020-04-19 MED ORDER — ORAL CARE MOUTH RINSE
15.0000 mL | Freq: Two times a day (BID) | OROMUCOSAL | Status: DC
  Administered 2020-04-19 – 2020-04-25 (×12): 15 mL via OROMUCOSAL

## 2020-04-19 MED ORDER — QUETIAPINE FUMARATE 25 MG PO TABS
12.5000 mg | ORAL_TABLET | Freq: Every day | ORAL | Status: DC
  Administered 2020-04-20 – 2020-04-25 (×6): 12.5 mg via ORAL
  Filled 2020-04-19 (×6): qty 1

## 2020-04-19 MED ORDER — LUBIPROSTONE 24 MCG PO CAPS
24.0000 ug | ORAL_CAPSULE | Freq: Every day | ORAL | Status: DC
  Administered 2020-04-20 – 2020-04-25 (×6): 24 ug via ORAL
  Filled 2020-04-19 (×7): qty 1

## 2020-04-19 MED ORDER — CEFAZOLIN SODIUM-DEXTROSE 2-4 GM/100ML-% IV SOLN: 2.0000 g | Freq: Once | INTRAVENOUS | Status: DC

## 2020-04-19 MED ORDER — EZETIMIBE 10 MG PO TABS
10.0000 mg | ORAL_TABLET | Freq: Every day | ORAL | Status: DC
  Administered 2020-04-19 – 2020-04-24 (×6): 10 mg via ORAL
  Filled 2020-04-19 (×6): qty 1

## 2020-04-19 MED ORDER — ONDANSETRON HCL 4 MG PO TABS: 4.0000 mg | ORAL_TABLET | Freq: Four times a day (QID) | ORAL | Status: DC | PRN

## 2020-04-19 MED ORDER — LEVOTHYROXINE SODIUM 50 MCG PO TABS
50.0000 ug | ORAL_TABLET | Freq: Every day | ORAL | Status: DC
  Administered 2020-04-20 – 2020-04-25 (×6): 50 ug via ORAL
  Filled 2020-04-19 (×6): qty 1

## 2020-04-19 MED ORDER — CHLORHEXIDINE GLUCONATE CLOTH 2 % EX PADS
6.0000 | MEDICATED_PAD | Freq: Every day | CUTANEOUS | Status: DC
  Administered 2020-04-19 – 2020-04-24 (×6): 6 via TOPICAL

## 2020-04-19 MED ORDER — ROSUVASTATIN CALCIUM 20 MG PO TABS
20.0000 mg | ORAL_TABLET | Freq: Every day | ORAL | Status: DC
  Administered 2020-04-20 – 2020-04-25 (×6): 20 mg via ORAL
  Filled 2020-04-19 (×7): qty 1

## 2020-04-19 MED ORDER — PANTOPRAZOLE SODIUM 40 MG PO TBEC
40.0000 mg | DELAYED_RELEASE_TABLET | Freq: Every day | ORAL | Status: DC
  Administered 2020-04-20 – 2020-04-25 (×6): 40 mg via ORAL
  Filled 2020-04-19 (×6): qty 1

## 2020-04-19 MED ORDER — SENNOSIDES-DOCUSATE SODIUM 8.6-50 MG PO TABS
1.0000 | ORAL_TABLET | Freq: Every day | ORAL | Status: DC
  Administered 2020-04-20 – 2020-04-23 (×4): 1 via ORAL
  Filled 2020-04-19 (×3): qty 1

## 2020-04-19 MED ORDER — SODIUM CHLORIDE 0.9 % IV SOLN
1.0000 g | Freq: Once | INTRAVENOUS | Status: AC
  Administered 2020-04-19: 1 g via INTRAVENOUS
  Filled 2020-04-19: qty 10
  Filled 2020-04-19: qty 1

## 2020-04-19 NOTE — ED Notes (Signed)
Pure wick reapplied to patient. Husband remains at bedside. Updated regarding plan of care. Bed/and linen changed.

## 2020-04-19 NOTE — ED Triage Notes (Signed)
Pt is from [facility] and for three days the patient hasn't been feeling well, per facility she has a fever of 102 and she's breathing 45 times a minute Pt states that its' hard to breathe

## 2020-04-19 NOTE — ED Provider Notes (Signed)
Emergency Department Provider Note  I have reviewed the triage vital signs and the nursing notes.  HISTORY  Chief Complaint Fever   HPI [name] is a 86 y.o. female with medical problems as documented below who presents to the emergency department today with concern for infection.  I reviewed the records and EMS report it appears that the patient has been dealing with a UTI/recurrent UTI for the last 10 days or so.  Tonight had a temperature above 102 with fast breathing so was sent here for further evaluation.  Patient apparently had E. coli that was sensitive to nitrofurantoin but not Levaquin and just had a medication change recently.  No cough.  No other vomiting, abdominal pain or other sick contacts.   No other associated or modifying symptoms.    Past Medical History:  Diagnosis Date  . Anemia   . Anxiety   . Aortic stenosis    moderate by echo 2021   . Arthritis   . Broken arm    right   . Chronic diastolic CHF (congestive heart failure) ([place])   . Family history of adverse reaction to anesthesia    pt. states sister vomits  . Fibromyalgia   . Fracture of thumb 09/01/2015   right  . Fracture, foot 09/01/2015   right  . GERD (gastroesophageal reflux disease)   . H/O hiatal hernia   . History of bronchitis   . History of kidney stones   . Hypercholesterolemia   . Hypertension    dr t turner  . Hypothyroidism   . Neuropathy   . Osteoporosis   . Parkinson disease ([place])   . PVD (peripheral vascular disease) ([facility])    99% stenosis of left anteiror tibial artery, mod stenosis of left distal SFA and popliteal artery followed by [name]  . RBBB    noted on EKG 2018  . Restless leg   . Shortness of breath    with exertion - chronic   . Sjogren's disease ([place])   . SOB (shortness of breath)    chronic due to diastolic dysfunction, deconditioning, obesity  . Tremor     Patient Active Problem List   Diagnosis Date Noted  . Acute kidney injury ([place])  04/18/2020  . Fever of unknown origin 04/15/2020  . Erosive (osteo)arthritis 03/09/2020  . Right hand pain 02/01/2020  . Dysuria 10/16/2019  . UTI (urinary tract infection) 10/12/2019  . Fall 09/21/2019  . Confusion 07/24/2019  . Depression, psychotic ([place]) 05/08/2019  . Nose dryness 04/23/2019  . Actinic cheilitis 04/23/2019  . Skin lesion 03/05/2019  . DOE (dyspnea on exertion) 12/18/2018  . Ingrowing nail, right great toe 12/18/2018  . Iron deficiency anemia 10/09/2018  . Renal lithiasis 09/19/2018  . Urinary frequency 09/18/2018  . Corns and callus 09/18/2018  . Hallucinations, visual 07/21/2018  . History of recent fall 07/21/2018  . Acute right-sided low back pain 05/22/2018  . Unstable gait 04/21/2018  . Memory loss 04/21/2018  . Abnormal nuclear stress test   . Hypothyroidism 02/04/2018  . Chronic back pain 02/04/2018  . Osteoporosis without current pathological fracture 02/04/2018  . Recurrent UTI 02/04/2018  . Sjogren's syndrome ([place]) 02/04/2018  . Chronic constipation 02/04/2018  . RBBB   . Aortic stenosis   . Excessive cerumen in both ear canals 09/24/2016  . Sensorineural hearing loss (SNHL), bilateral 09/24/2016  . Temporomandibular jaw dysfunction 09/24/2016  . Acute lower UTI 03/24/2015  . Microhematuria 03/24/2015  . Follow up 02/15/2015  .  Other dysphagia 09/03/2014  . Unspecified hereditary and idiopathic peripheral neuropathy 09/03/2014  . Heart murmur 04/19/2014  . GERD (gastroesophageal reflux disease)   . Hypercholesterolemia   . PVD (peripheral vascular disease) ([place])   . Barrett esophagus   . Chronic diastolic CHF (congestive heart failure) ([place])   . Parkinson's disease ([place]) 08/26/2013  . Swelling of both ankles 11/20/2012  . Fracture of multiple pubic rami ([place]) 07/17/2012  . Hematoma of hip 07/17/2012  . Leukocytosis 07/17/2012  . Hypertension   . Atherosclerosis of native arteries of the extremities with intermittent claudication  05/22/2012    Past Surgical History:  Procedure Laterality Date  . ABDOMINAL HYSTERECTOMY    . BACK SURGERY     4 back surgeries,   lumbar fusion  . CARDIAC CATHETERIZATION  2006   normal  . CYSTOSCOPY/URETEROSCOPY/HOLMIUM LASER/STENT PLACEMENT Left 01/06/2019   Procedure: CYSTOSCOPY/RETROGRADE/URETEROSCOPY/HOLMIUM LASER/BASKET RETRIEVAL/STENT PLACEMENT;  Surgeon: Ceasar Mons, MD;  Location: [facility];  Service: Urology;  Laterality: Left;  . EYE SURGERY Bilateral    cateracts  . falls     variious fall, broken wrist,and toes  . FRACTURE SURGERY Right April 2016   Wrist, Pt. fell  . kidney stone removal Left 01/20/2019  . RIGHT/LEFT HEART CATH AND CORONARY ANGIOGRAPHY N/A 02/05/2018   Procedure: RIGHT/LEFT HEART CATH AND CORONARY ANGIOGRAPHY;  Surgeon: Troy Sine, MD;  Location: [facility];  Service: Cardiovascular;  Laterality: N/A;  . SPINAL CORD STIMULATOR INSERTION N/A 09/09/2015   Procedure: LUMBAR SPINAL CORD STIMULATOR INSERTION;  Surgeon: Clydell Hakim, MD;  Location: [facility];  Service: Neurosurgery;  Laterality: N/A;  LUMBAR SPINAL CORD STIMULATOR INSERTION  . SPINE SURGERY  April 2013   Back X's 4  . TOTAL HIP ARTHROPLASTY     right  . WRIST FRACTURE SURGERY Bilateral     Current Outpatient Rx  . Order #: [id number] Class: Historical Med  . Order #: [id number] Class: Historical Med  . Order #: [id number] Class: Normal  . Order #: [id number] Class: Historical Med  . Order #: [id number] Class: Historical Med  . Order #: [id number] Class: Historical Med  . Order #: [id number] Class: Historical Med  . Order #: [id number] Class: Print  . Order #: [id number] Class: Historical Med  . Order #: [id number] Class: Normal  . Order #: [id number] Class: Historical Med  . Order #: [id number] Class: Historical Med  . Order #: [id number] Class: Historical Med  . Order #: [id number] Class: Historical Med  . Order #: [id number] Class: Historical Med  . Order #: [id number] Class:  Historical Med  . Order #: [id number] Class: Historical Med  . Order #: [id number] Class: Historical Med  . Order #: [id number] Class: Historical Med  . Order #: [id number] Class: Normal  . Order #: [id number] Class: Historical Med  . Order #: [id number] Class: Historical Med  . Order #: [id number] Class: Historical Med  . Order #: [id number] Class: Normal  . Order #: [id number] Class: Historical Med  . Order #: [id number] Class: Historical Med  . Order #: [id number] Class: Historical Med  . Order #: [id number] Class: Historical Med  . Order #: [id number] Class: Historical Med  . Order #: [id number] Class: Historical Med    Allergies Adrenalone, Lactose, Morphine, Sulfa antibiotics, Codeine, Lactose intolerance (gi), Latex, Lyrica [pregabalin], Other, Plaquenil [hydroxychloroquine sulfate], Reglan [metoclopramide], Requip [ropinirole hcl], Septra [sulfamethoxazole-trimethoprim], and Shellfish allergy  Family History  Problem Relation Age of Onset  . Heart attack Mother   . Restless legs syndrome Mother   . Heart failure Mother   . Heart disease Mother   . Hypertension Mother   . COPD Father  Social History Social History   Tobacco Use  . Smoking status: Never Smoker  . Smokeless tobacco: Never Used  Substance Use Topics  . Alcohol use: No    Alcohol/week: 0.0 standard drinks  . Drug use: No    Review of Systems  All other systems negative except as documented in the HPI. All pertinent positives and negatives as reviewed in the HPI. ____________________________________________  PHYSICAL EXAM:  VITAL SIGNS: ED Triage Vitals  Enc Vitals Group     BP 04/19/20 0153 (!) 150/71     Pulse Rate 04/19/20 0153 (!) 115     Resp 04/19/20 0153 (S) (!) 44     Temp 04/19/20 0153 (!) 97.3 F (36.3 C)     Temp Source 04/19/20 0153 Oral     SpO2 04/19/20 0153 98 %     Weight 04/19/20 0157 144 lb (65.3 kg)     Height 04/19/20 0156 5\' 2"  (1.575 m)    Constitutional: Alert and oriented. Well  appearing and in no acute distress. Eyes: Conjunctivae are normal. PERRL. EOMI. Head: Atraumatic. Nose: No congestion/rhinnorhea. Mouth/Throat: Mucous membranes are moist.  Oropharynx non-erythematous. Neck: No stridor.  No meningeal signs.   Cardiovascular: tachycardic rate, regular rhythm. Good peripheral circulation. Grossly normal heart sounds.   Respiratory: tachypneic respiratory effort.  No retractions. Lungs CTAB. Gastrointestinal: Soft and nontender. No distention.  Musculoskeletal: No lower extremity tenderness nor edema. No gross deformities of extremities. Neurologic:  Normal speech and language. No gross focal neurologic deficits are appreciated.  Skin:  Skin is warm, dry and intact. No rash noted.  ____________________________________________   LABS (all labs ordered are listed, but only abnormal results are displayed)  Labs Reviewed  LACTIC ACID, PLASMA - Abnormal; Notable for the following components:      Result Value   Lactic Acid, Venous 3.1 (*)    All other components within normal limits  LACTIC ACID, PLASMA - Abnormal; Notable for the following components:   Lactic Acid, Venous 2.8 (*)    All other components within normal limits  COMPREHENSIVE METABOLIC PANEL - Abnormal; Notable for the following components:   Potassium 3.4 (*)    CO2 19 (*)    Glucose, Bld 108 (*)    BUN 29 (*)    Creatinine, Ser 1.64 (*)    Calcium 7.7 (*)    Total Protein 5.4 (*)    Albumin 2.7 (*)    AST 53 (*)    GFR calc non Af Amer 28 (*)    GFR calc Af Amer 32 (*)    All other components within normal limits  CBC WITH DIFFERENTIAL/PLATELET - Abnormal; Notable for the following components:   WBC 1.0 (*)    RBC 2.91 (*)    Hemoglobin 9.1 (*)    HCT 29.5 (*)    MCV 101.4 (*)    Platelets 131 (*)    Neutro Abs 0.7 (*)    Lymphs Abs 0.2 (*)    Monocytes Absolute 0.0 (*)    All other components within normal limits  PROTIME-INR - Abnormal; Notable for the following  components:   Prothrombin Time 15.4 (*)    INR 1.3 (*)    All other components within normal limits  URINALYSIS, ROUTINE W REFLEX MICROSCOPIC - Abnormal; Notable for the following components:   Color, Urine AMBER (*)    APPearance HAZY (*)    Hgb urine dipstick MODERATE (*)    Protein, ur 30 (*)    Nitrite POSITIVE (*)  Leukocytes,Ua MODERATE (*)    RBC / HPF >50 (*)    WBC, UA >50 (*)    Bacteria, UA RARE (*)    All other components within normal limits  CULTURE, BLOOD (ROUTINE X 2)  CULTURE, BLOOD (ROUTINE X 2)  URINE CULTURE  APTT  CBG MONITORING, ED   ____________________________________________  EKG   EKG Interpretation  Date/Time:  Tuesday Apr 19 2020 02:04:04 EDT Ventricular Rate:  115 PR Interval:    QRS Duration: 126 QT Interval:  327 QTC Calculation: 453 R Axis:   30 Text Interpretation: Sinus tachycardia Right bundle branch block faster rate, no other changes Confirmed by Merrily Pew [id number]) on 04/19/2020 4:00:05 AM       ____________________________________________  RADIOLOGY  DG Chest Port 1 View  Result Date: 04/19/2020 CLINICAL DATA:  Shortness of breath and fever EXAM: PORTABLE CHEST 1 VIEW COMPARISON:  Chest radiograph 01/15/2019 FINDINGS: The heart size and mediastinal contours are within normal limits. Both lungs are clear. The visualized skeletal structures are unremarkable. Spinal stimulator leads overlie the midthoracic spine. IMPRESSION: No active disease. Electronically Signed   By: Ulyses Jarred M.D.   On: 04/19/2020 02:35   ____________________________________________  PROCEDURES  Procedure(s) performed:   .Critical Care Performed by: Merrily Pew, MD Authorized by: Merrily Pew, MD   Critical care provider statement:    Critical care time (minutes):  45   Critical care was necessary to treat or prevent imminent or life-threatening deterioration of the following conditions:  Sepsis   Critical care was time spent personally by  me on the following activities:  Discussions with consultants, evaluation of patient's response to treatment, examination of patient, ordering and performing treatments and interventions, ordering and review of laboratory studies, ordering and review of radiographic studies, pulse oximetry, re-evaluation of patient's condition, obtaining history from patient or surrogate and review of old charts   ____________________________________________  INITIAL IMPRESSION / [place] / ED COURSE   This patient presents to the ED for concern of sepsis/UTI, this involves an extensive number of treatment options, and is a complaint that carries with it a high risk of complications and morbidity.  The differential diagnosis includes sepsis secondary to UTI or pneumonia, thyroid storm, pheochromocytoma, hypovolemia.   Clinical Course as of Apr 20 451  Tue Apr 19, 2020  0233 No obvious consolidation or e/o infection. Suspect tachypnea related to significantly elevated temp.   DG Chest Port 1 View [JM]    Clinical Course User Index [JM] Guliana Weyandt, Corene Cornea, MD    Lab Tests:   I Ordered, reviewed, and interpreted labs, which included LA, CBC, CMP, UA and shows leukopenia, lactic acidosis and UTI. Also with mild AKI from previous.   Medicines ordered:   I ordered medication rocephin  For likely sepsis from UTI, tylenol for fever, fluids for tachycardia and hypovolemia.  Imaging Studies ordered:   I independently visualized and interpreted imaging cxr which showed no obvious consolidation  Additional history obtained:   Additional history obtained from EMS  Previous records obtained and reviewed in Epic.   Consultations Obtained:   I consulted hospitalist for admission  and discussed lab and imaging findings  Reevaluation:  After the interventions stated above, I reevaluated the patient and found softer pressures but improving RR and tachycardia.   Critical  Interventions: Antibiotics, fluid boluses for sepsis.   ____________________________________________  FINAL CLINICAL IMPRESSION(S) / ED DIAGNOSES  Final diagnoses:  Fever, unspecified fever cause  Sepsis, due to unspecified organism, unspecified  whether acute organ dysfunction present [facility])  Urinary tract infection without hematuria, site unspecified    MEDICATIONS GIVEN DURING THIS VISIT:  Medications  cefTRIAXone (ROCEPHIN) 1 g in sodium chloride 0.9 % 100 mL IVPB (0 g Intravenous Stopped 04/19/20 0305)  lactated ringers bolus 1,000 mL (has no administration in time range)  acetaminophen (TYLENOL) tablet 1,000 mg (1,000 mg Oral Given 04/19/20 0222)  lactated ringers bolus 1,000 mL (0 mLs Intravenous Stopped 04/19/20 0353)  lactated ringers bolus 1,000 mL (1,000 mLs Intravenous New Bag/Given 04/19/20 0408)    NEW OUTPATIENT MEDICATIONS STARTED DURING THIS VISIT:  New Prescriptions   No medications on file    Note:  This note was prepared with assistance of Dragon voice recognition software. Occasional wrong-word or sound-a-like substitutions may have occurred due to the inherent limitations of voice recognition software.   Wavie Hashimi, Corene Cornea, MD 04/19/20 [phone]

## 2020-04-19 NOTE — Progress Notes (Addendum)
   Called to bedside because daughter feels that patient face is puffier. RN/Rt reported wheezing and mild resp distress iwthout hypoxemia. Improved with nebs  On exam compared to few hours ago   - mild generalized facial edema - tongue and uvula ok - rrm high 20s but not paradoxical - worse - coaurse BS - new - no skin rash or redness - med review for allergies and con meds - did not get any allergy meds - cxr current - left diaph elevated compared to earlier today - pressor needs rpadily improving - no strideor  A Volume overload  Intermediate sampson criteria for anaphylasois  Plan  - lasix 20mg  IV x 1  - pharmacy review of medications - monitor closely - echo   Daughter updated at bedside  SIGNATURE    Dr. Brand Males, M.D., F.C.C.P,  Pulmonary and Critical Care Medicine Staff Physician, [place] Director - Interstitial Lung Disease  Program  Pulmonary [place] [facility] [place], [place], [address]  Pager: [phone], If no answer or between  15:00h - 7:00h: call [phone] Telephone: [phone]  5:39 PM 04/19/2020

## 2020-04-19 NOTE — Consult Note (Signed)
NAME:  [name], MRN:  [id number], DOB:  24-Sep-1931, LOS: 0 ADMISSION DATE:  04/19/2020, CONSULTATION DATE:  04/19/20 REFERRING MD:  Alcario Drought  CHIEF COMPLAINT:  Fever   Brief History   [name] is a 86 y.o. female who was admitted 5/11 with septic shock due to UTI 2/2 right renal obstructing calculus.  She was started on phenylephrine in ED and is to have IR percutaneous nephrostomy tube placed this morning.  History of present illness   [name] is a 86 y.o. female who has a PMH as outlined below.  She presented to [facility] ED 5/11 with fever.  She had UTI symptoms for 10 days prior and per report, she was initially started on levaquin then switched to macrobid 5/7 after cultures grew E.coli and sensitivities returned.  I am unable to confirm this in the chart as notes only indicate macrobid.  In ED, she was found to have sepsis and UA confirmed UTI.  She also had renal CT study which demonstrated obstructing 11 x 50mm right ureteral stone with hydroureteronephrosis.  She was evaluated by urology who felt that pt would benefit from percutaneous nephrostomy tube by IR.  Case discussed with IR who is planning for procedure this AM.   Due to ongoing hypotension with SBP in 70's despite 2L IVF (currently on 3rd), PCCM asked to evaluate.  Per [facility] notes, pt is limited code with no CPR / defib, but OK for temporary intubation and pressors as needed.  She is to be started on phenylephrine in the ED.  Past Medical History  has Atherosclerosis of native arteries of the extremities with intermittent claudication; Fracture of multiple pubic rami ([place]); Hematoma of hip; Hypertension; Leukocytosis; Swelling of both ankles; Parkinson's disease ([place]); GERD (gastroesophageal reflux disease); Hypercholesterolemia; PVD (peripheral vascular disease) ([place]); Barrett esophagus; Chronic diastolic CHF (congestive heart failure) ([place]); Heart murmur; Other dysphagia; Unspecified hereditary and idiopathic  peripheral neuropathy; Aortic stenosis; RBBB; Hypothyroidism; Chronic back pain; Osteoporosis without current pathological fracture; Recurrent UTI; Sjogren's syndrome ([place]); Chronic constipation; Abnormal nuclear stress test; Unstable gait; Memory loss; Acute right-sided low back pain; Hallucinations, visual; History of recent fall; Urinary frequency; Corns and callus; Renal lithiasis; Iron deficiency anemia; DOE (dyspnea on exertion); Ingrowing nail, right great toe; Skin lesion; Nose dryness; Actinic cheilitis; Depression, psychotic ([place]); Acute lower UTI; Excessive cerumen in both ear canals; Follow up; Microhematuria; Sensorineural hearing loss (SNHL), bilateral; Temporomandibular jaw dysfunction; Confusion; Fall; UTI (urinary tract infection); Dysuria; Right hand pain; Erosive (osteo)arthritis; Fever of unknown origin; Acute kidney injury ([place]); and Severe sepsis ([place]) on their problem list.  Significant Hospital Events   5/11 > admit.  Consults:  PCCM, IR, urology.  Procedures:  5/11 > IR perc nephrostomy tube planned.  Significant Diagnostic Tests:  CT renal 5/11 > obstructing 11 x 7 mm right ureteral stone with hydroureteronephrosis, 8mm left renal calculus.  Micro Data:  Blood 5/11 >  Urine 5/11 >  Flu 5/11 > neg. COVID 5/11 > neg.  Antimicrobials:  Ceftriaxone 5/11 >    Interim history/subjective:  Awake, feels thirsty.  SBP 70.  Awaiting neo from pharmacy.  Objective:  Blood pressure (!) 73/48, pulse 87, temperature 99 F (37.2 C), temperature source Oral, resp. rate (!) 33, height 5\' 2"  (1.575 m), weight 65.3 kg, SpO2 97 %.       No intake or output data in the 24 hours ending 04/19/20 0812 Filed Weights   04/19/20 0157  Weight: 65.3 kg  Examination: General: Elderly female, in NAD. Neuro: A&O x 3 though slow to respond.  MAE's. HEENT: [place]/AT. Sclerae anicteric.  EOMI.  MM very dry. Cardiovascular: RRR, no M/R/G.  Lungs: Respirations even and unlabored.  CTA  bilaterally, No W/R/R.   Abdomen: Tender to deep palpation on RUQ and RLQ.  BS x 4, soft, NT/ND.  Musculoskeletal: No gross deformities, no edema.  Skin: Intact, warm, no rashes. + skin tenting.  Assessment & Plan:   Septic shock - 2/2 UTI in setting obstructing right ureteral stone.  Seen as outpatient and cultures grew E.coli (Per report, initially started on levaquin then switched to macrobid 5/7 after sensitivities returned.  I am unable to confirm this in the chart as notes only indicate macrobid).  Now started on ceftriaxone here in ED and IR planning for perc nephrostomy tube placement this morning. - Continue empiric ceftriaxone. - Follow cultures. - Continue phenylephrine via PIV, goal MAP > 65.  Will consider CVL depending on her response and dose required. - Continue MIVF. - Agree with empiric stress steroids given chronic prednisone use. - Percutaneous nephrostomy tube by IR this morning.  AKI - 2/2 above. Hypokalemia. - 20 mEq K PO. - Supportive care as above.  Hx Sjogrens - on plaquenil and prednisone. - Hold plaquenil. - Empiric stress steroids in lieu of prednisone.  Hx hypothyroidism. - Continue home synthroid.  Hx GERD. - Continue home PPI.  Hx HTN, HLD, RBB, dCHF, AS. - Continue home ASA, ezetimibe, rosuvastatin. - Hold home   Hx depression, visual hallucinations, fibromyalgia, anxiety, parkinsons, RLS. - Continue home sinemet, quetiapine, sertraline. - Hold home   Best Practice:  Diet: NPO. Pain/Anxiety/Delirium protocol (if indicated): N/A. VAP protocol (if indicated): N/A. DVT prophylaxis: SCD's / Heparin. GI prophylaxis: N/A. Glucose control: SSI if glucose consistently > 180. Mobility: Bedrest. Code Status: Limited Code - OK with temporary intubation and pressors as needed but no CPR / defibrillations. Family Communication: Husband updated at bedside. Disposition: ICU.  Labs   CBC: Recent Labs  Lab 04/15/20 0000 04/19/20 0200  WBC 17.6  1.0*  NEUTROABS  --  0.7*  HGB 9.6* 9.1*  HCT 29* 29.5*  MCV  --  101.4*  PLT 173 [id number]*   Basic Metabolic Panel: Recent Labs  Lab 04/15/20 0000 04/19/20 0200  NA 138 138  K 3.8 3.4*  CL 104 107  CO2 25* 19*  GLUCOSE  --  108*  BUN 26* 29*  CREATININE 1.3* 1.64*  CALCIUM 8.7 7.7*   GFR: Estimated Creatinine Clearance: 21.8 mL/min (A) (by C-G formula based on SCr of 1.64 mg/dL (H)). Recent Labs  Lab 04/15/20 0000 04/19/20 0200 04/19/20 0400 04/19/20 0505  PROCALCITON  --   --   --  45.94  WBC 17.6 1.0*  --   --   LATICACIDVEN  --  3.1* 2.8*  --    Liver Function Tests: Recent Labs  Lab 04/15/20 0000 04/19/20 0200  AST 19 53*  ALT 3* 20  ALKPHOS 47 99  BILITOT  --  0.6  PROT  --  5.4*  ALBUMIN 3.5 2.7*   No results for input(s): LIPASE, AMYLASE in the last 168 hours. No results for input(s): AMMONIA in the last 168 hours. ABG    Component Value Date/Time   PHART 7.382 02/05/2018 1452   PCO2ART 36.7 02/05/2018 1452   PO2ART 110.0 (H) 02/05/2018 1452   HCO3 21.8 02/05/2018 1452   TCO2 23 02/05/2018 1452   ACIDBASEDEF 3.0 (H) 02/05/2018  1452   O2SAT 98.0 02/05/2018 1452    Coagulation Profile: Recent Labs  Lab 04/19/20 0200  INR 1.3*   Cardiac Enzymes: No results for input(s): CKTOTAL, CKMB, CKMBINDEX, TROPONINI in the last 168 hours. HbA1C: No results found for: HGBA1C CBG: Recent Labs  Lab 04/19/20 0200  GLUCAP 94    Review of Systems:   All negative; except for those that are bolded, which indicate positives.  Constitutional: weight loss, weight gain, night sweats, fevers, chills, fatigue, weakness.  HEENT: headaches, sore throat, sneezing, nasal congestion, post nasal drip, difficulty swallowing, tooth/dental problems, visual complaints, visual changes, ear aches. Neuro: difficulty with speech, weakness, numbness, ataxia. CV:  chest pain, orthopnea, PND, swelling in lower extremities, dizziness, palpitations, syncope.  Resp: cough,  hemoptysis, dyspnea, wheezing. GI: heartburn, indigestion, abdominal pain, nausea, vomiting, diarrhea, constipation, change in bowel habits, loss of appetite, hematemesis, melena, hematochezia. GU: dysuria, change in color of urine, urgency or frequency, right flank pain, hematuria. MSK: joint pain or swelling, decreased range of motion. Psych: change in mood or affect, depression, anxiety, suicidal ideations, homicidal ideations. Skin: rash, itching, bruising.   Past medical history  She,  has a past medical history of Anemia, Anxiety, Aortic stenosis, Arthritis, Broken arm, Chronic diastolic CHF (congestive heart failure) ([place]), Family history of adverse reaction to anesthesia, Fibromyalgia, Fracture of thumb (09/01/2015), Fracture, foot (09/01/2015), GERD (gastroesophageal reflux disease), H/O hiatal hernia, History of bronchitis, History of kidney stones, Hypercholesterolemia, Hypertension, Hypothyroidism, Neuropathy, Osteoporosis, Parkinson disease ([place]), PVD (peripheral vascular disease) ([place]), RBBB, Restless leg, Shortness of breath, Sjogren's disease ([place]), SOB (shortness of breath), and Tremor.   Surgical History    Past Surgical History:  Procedure Laterality Date  . ABDOMINAL HYSTERECTOMY    . BACK SURGERY     4 back surgeries,   lumbar fusion  . CARDIAC CATHETERIZATION  2006   normal  . CYSTOSCOPY/URETEROSCOPY/HOLMIUM LASER/STENT PLACEMENT Left 01/06/2019   Procedure: CYSTOSCOPY/RETROGRADE/URETEROSCOPY/HOLMIUM LASER/BASKET RETRIEVAL/STENT PLACEMENT;  Surgeon: Ceasar Mons, MD;  Location: [facility];  Service: Urology;  Laterality: Left;  . EYE SURGERY Bilateral    cateracts  . falls     variious fall, broken wrist,and toes  . FRACTURE SURGERY Right April 2016   Wrist, Pt. fell  . kidney stone removal Left 01/20/2019  . RIGHT/LEFT HEART CATH AND CORONARY ANGIOGRAPHY N/A 02/05/2018   Procedure: RIGHT/LEFT HEART CATH AND CORONARY ANGIOGRAPHY;  Surgeon: Troy Sine, MD;   Location: [facility];  Service: Cardiovascular;  Laterality: N/A;  . SPINAL CORD STIMULATOR INSERTION N/A 09/09/2015   Procedure: LUMBAR SPINAL CORD STIMULATOR INSERTION;  Surgeon: Clydell Hakim, MD;  Location: [facility];  Service: Neurosurgery;  Laterality: N/A;  LUMBAR SPINAL CORD STIMULATOR INSERTION  . SPINE SURGERY  April 2013   Back X's 4  . TOTAL HIP ARTHROPLASTY     right  . WRIST FRACTURE SURGERY Bilateral      Social History   reports that she has never smoked. She has never used smokeless tobacco. She reports that she does not drink alcohol or use drugs.   Family history   Her family history includes COPD in her father; Heart attack in her mother; Heart disease in her mother; Heart failure in her mother; Hypertension in her mother; Restless legs syndrome in her mother.   Allergies Allergies  Allergen Reactions  . Adrenalone   . Lactose Other (See Comments)    abd pain, lactose intolerant  . Morphine Other (See Comments)  . Sulfa Antibiotics Other (  See Comments)    Headache, very sick  . Codeine Other (See Comments)    Reaction:  Headaches and nightmares   . Lactose Intolerance (Gi) Nausea And Vomiting  . Latex Rash  . Lyrica [Pregabalin] Swelling and Other (See Comments)    Reaction:  Leg swelling  . Other Other (See Comments)    Pt states that pain medications give her nightmares.    . Plaquenil [Hydroxychloroquine Sulfate] Other (See Comments)    Reaction:  GI upset   . Reglan [Metoclopramide] Other (See Comments)    Reaction:  GI upset   . Requip [Ropinirole Hcl] Other (See Comments)    Reaction:  GI upset   . Septra [Sulfamethoxazole-Trimethoprim] Nausea And Vomiting  . Shellfish Allergy Nausea And Vomiting     Home meds  Prior to Admission medications   Medication Sig Start Date End Date Taking? Authorizing Provider  acetaminophen (TYLENOL) 500 MG tablet Take 500 mg by mouth daily as needed for mild pain.    Yes [provider]    acetaminophen (TYLENOL) 500 MG tablet Take 500 mg by mouth 2 (two) times daily.   Yes [provider]  albuterol (PROVENTIL HFA;VENTOLIN HFA) 108 (90 Base) MCG/ACT inhaler Inhale 2 puffs into the lungs every 6 (six) hours as needed for wheezing or shortness of breath. 01/15/19  Yes Collene Gobble, MD  aspirin EC 81 MG tablet Take 81 mg by mouth daily.    Yes [provider]  Calcium Carb-Cholecalciferol (CALCIUM 600/VITAMIN D3) 600-800 MG-UNIT TABS Take 1 tablet by mouth daily.   Yes [provider]  carbidopa-levodopa (SINEMET IR) 25-100 MG tablet Take 2 tablets by mouth in the morning and at bedtime.    Yes [provider]  Cholecalciferol (VITAMIN D) 2000 units CAPS Take 2,000 Units by mouth daily.   Yes [provider]  clotrimazole-betamethasone (LOTRISONE) cream Apply 1 application topically 2 (two) times daily. 04/11/20  Yes Trula Slade, DPM  docusate sodium (COLACE) 100 MG capsule Take 100 mg by mouth at bedtime.   Yes [provider]  ezetimibe (ZETIA) 10 MG tablet TAKE 1 TABLET ONCE DAILY. 06/01/19  Yes [name], MD  Ferrous Sulfate (SLOW FE) 142 (45 Fe) MG TBCR Take 1 tablet by mouth. Daily   Yes [provider]  hydrocortisone 2.5 % cream Apply 1 application topically 2 (two) times daily.   Yes [provider]  hydroxychloroquine (PLAQUENIL) 200 MG tablet Take 200 mg by mouth daily.   Yes [provider]  lansoprazole (PREVACID SOLUTAB) 30 MG disintegrating tablet Take 30 mg by mouth daily at 12 noon.   Yes [provider]  levothyroxine (SYNTHROID, LEVOTHROID) 50 MCG tablet Take 50 mcg by mouth daily before breakfast.    Yes [provider]  lubiprostone (AMITIZA) 24 MCG capsule Take 24 mcg by mouth daily.    Yes [provider]  Multiple Vitamin (MULTIVITAMIN WITH MINERALS) TABS tablet Take 1 tablet by mouth daily.   Yes [provider]  Multiple  Vitamins-Minerals (PRESERVISION AREDS 2) CHEW Chew 1 tablet by mouth daily.    Yes [provider]  nitrofurantoin, macrocrystal-monohydrate, (MACROBID) 100 MG capsule Take 100 mg by mouth 2 (two) times daily.   Yes [provider]  nystatin (MYCOSTATIN/NYSTOP) powder Apply topically 2 (two) times daily as needed. Patient taking differently: Apply topically 2 (two) times daily as needed (dermatitis).  12/18/18  Yes [name], Man X, NP  Polyethyl Glycol-Propyl Glycol (SYSTANE) 0.4-0.3 %  SOLN Apply 1 drop to eye 2 (two) times daily as needed (dry eyes).    Yes [provider]  pramipexole (MIRAPEX) 1 MG tablet Take 1 mg by mouth 2 (two) times daily.   Yes [provider]  QUEtiapine (SEROQUEL) 25 MG tablet Take 12.5-25 mg by mouth See admin instructions. Take 12.5 mg in Am and 25 mg in PM   Yes [provider]  rosuvastatin (CRESTOR) 20 MG tablet Take 1 tablet (20 mg total) by mouth daily. 05/01/19 04/30/20 Yes Turner, Eber Hong, MD  saccharomyces boulardii (FLORASTOR) 250 MG capsule Take 250 mg by mouth 2 (two) times daily.   Yes [provider]  senna-docusate (SENOKOT-S) 8.6-50 MG tablet Take 1 tablet by mouth at bedtime.    Yes [provider]  sertraline (ZOLOFT) 50 MG tablet Take 75 mg by mouth daily. Take 1.5 tablets to = 75 mg   Yes [provider]  Skin Protectants, Misc. (EUCERIN) cream Apply 1 application topically daily.    Yes [provider]  Spacer/Aero-Holding Chambers (AEROCHAMBER MV) inhaler by Other route. Use as instructed as needed   Yes [provider]  predniSONE (DELTASONE) 1 MG tablet Take 3 mg by mouth daily with breakfast. 3 tabs x5 days 04/15/20 04/19/20  [provider]    Critical care time: 35 min.    Montey Hora, [facility] 04/19/2020, 8:12 AM

## 2020-04-19 NOTE — Procedures (Signed)
Pre Procedure Dx: Hydronephrosis Post Procedural Dx: Same  Successful [place] and fluoroscopic guided placement of a right sided PCN with end coiled and locked within the renal pelvis. PCN connected to gravity bag.  Sample of aspirated foul smelling urine sent to lab for analysis.  EBL: None Complications: None immediate.  Ronny Bacon, MD Pager #: [phone]

## 2020-04-19 NOTE — Progress Notes (Signed)
Pharmacy Brief Note:   Pharmacy asked to review patients current medication list for potential cross-reaction against allergy profile:   Pt has numerous medication allergies listed on profile, but no anaphylactic reactions documented.   Pain medications:  Allergies:  -Codeine (headache, nightmares); morphine (not specified)  Pt has received tramadol, fentanyl, hydromorphone, morphine, and oxycodone in the past per Epic records.   Antibiotics: Pt currently on ceftriaxone. Has received cephalosporins previously in 2013, 2016, and 2020.   Others: Plaquenil listed as allergy (N/V), not currently ordered, but taking PTA per med rec.   Home medications have been continued on admission. Medications reviewed, no cross-reaction found to current allergy profile.   New medications today: chlorhexidine  Lenis Noon, PharmD 04/19/20 6:46 PM

## 2020-04-19 NOTE — ED Notes (Signed)
Pt requests her daughter be updated with any changes. Daughter's name is [name] and her phone number is [phone]

## 2020-04-19 NOTE — ED Notes (Signed)
Date and time results received: 04/19/20 3:06 AM  (use smartphrase ".now" to insert current time)  Test: lactic acid Critical Value: 3.1  Name of Provider Notified: Merrily Pew, MD  Orders Received? Or Actions Taken?:

## 2020-04-19 NOTE — ED Notes (Signed)
I gave the patient a cup of ginger ale and some peanut butter crackers per MD

## 2020-04-19 NOTE — Progress Notes (Signed)
Patient admitted for septic shock secondary to obstructive nephropathy/urinary tract infection.  Currently requiring pressors.  Spoke with critical care team, who willt take over care while on pressors.  Once off pressors, [facility] will take over.  Amanda Ren  MD [facility]

## 2020-04-19 NOTE — ED Notes (Signed)
Husband is at bedside and has signed consent form for treatment. Pharmacy has been notified about verifying/ and sending Phenylephrine medication. Admitting provider aware of BP.

## 2020-04-19 NOTE — Consult Note (Signed)
Urology Consult Note   Requesting Attending Physician:  Damita Lack, MD Service Providing Consult: Urology   Reason for Consult:  Nephrolithiasis  HPI: [name] is seen in consultation for reasons noted above at the request of Damita Lack, MD  for evaluation of obstructed and infected ureteral stone.  Patient with a prior history of aortic stenosis, CHF, recurrent UTIs who was recently treated for asymptomatic urinary tract infection at her assisted living facility.  Was empirically treated with Levaquin.  Cultures reportedly came back with E. coli, I do not have these for review.  Has been taking Macrobid since then.  Leukocytosis was as high as 17 approximately 4 days ago, now leukopenic with WBC 1  Husband is at bedside who provides most of the history given the patient is not fully lucid.  Developed worsening abdominal pain and confusion yesterday and therefore presented to emergency department.  In the emergency department, T-max 103.9, tachycardic to 110s, hypotensive and currently undergoing fluid resuscitation.  Lactate 3.1 creatinine elevated to 6.1  Does have a history of prior kidney stones underwent laser lithotripsy in January 2020 with Dr. [name].   Past Medical History: Past Medical History:  Diagnosis Date  . Anemia   . Anxiety   . Aortic stenosis    moderate by echo 2021   . Arthritis   . Broken arm    right   . Chronic diastolic CHF (congestive heart failure) ([place])   . Family history of adverse reaction to anesthesia    pt. states sister vomits  . Fibromyalgia   . Fracture of thumb 09/01/2015   right  . Fracture, foot 09/01/2015   right  . GERD (gastroesophageal reflux disease)   . H/O hiatal hernia   . History of bronchitis   . History of kidney stones   . Hypercholesterolemia   . Hypertension    dr t turner  . Hypothyroidism   . Neuropathy   . Osteoporosis   . Parkinson disease ([place])   . PVD (peripheral vascular disease) ([facility])     99% stenosis of left anteiror tibial artery, mod stenosis of left distal SFA and popliteal artery followed by [name]  . RBBB    noted on EKG 2018  . Restless leg   . Shortness of breath    with exertion - chronic   . Sjogren's disease ([place])   . SOB (shortness of breath)    chronic due to diastolic dysfunction, deconditioning, obesity  . Tremor     Past Surgical History:  Past Surgical History:  Procedure Laterality Date  . ABDOMINAL HYSTERECTOMY    . BACK SURGERY     4 back surgeries,   lumbar fusion  . CARDIAC CATHETERIZATION  2006   normal  . CYSTOSCOPY/URETEROSCOPY/HOLMIUM LASER/STENT PLACEMENT Left 01/06/2019   Procedure: CYSTOSCOPY/RETROGRADE/URETEROSCOPY/HOLMIUM LASER/BASKET RETRIEVAL/STENT PLACEMENT;  Surgeon: Ceasar Mons, MD;  Location: [facility];  Service: Urology;  Laterality: Left;  . EYE SURGERY Bilateral    cateracts  . falls     variious fall, broken wrist,and toes  . FRACTURE SURGERY Right April 2016   Wrist, Pt. fell  . kidney stone removal Left 01/20/2019  . RIGHT/LEFT HEART CATH AND CORONARY ANGIOGRAPHY N/A 02/05/2018   Procedure: RIGHT/LEFT HEART CATH AND CORONARY ANGIOGRAPHY;  Surgeon: Troy Sine, MD;  Location: [facility];  Service: Cardiovascular;  Laterality: N/A;  . SPINAL CORD STIMULATOR INSERTION N/A 09/09/2015   Procedure: LUMBAR SPINAL CORD STIMULATOR INSERTION;  Surgeon: Eddie Dibbles  Maryjean Ka, MD;  Location: [facility];  Service: Neurosurgery;  Laterality: N/A;  LUMBAR SPINAL CORD STIMULATOR INSERTION  . SPINE SURGERY  April 2013   Back X's 4  . TOTAL HIP ARTHROPLASTY     right  . WRIST FRACTURE SURGERY Bilateral     Medication: Current Facility-Administered Medications  Medication Dose Route Frequency Provider Last Rate Last Admin  . 0.9 %  sodium chloride infusion   Intravenous Continuous Etta Quill, DO   Stopped at 04/19/20 0715  . 0.9 %  sodium chloride infusion  250 mL Intravenous Continuous Etta Quill, DO  150 mL/hr at 04/19/20 0717 250 mL at 04/19/20 0717  . acetaminophen (TYLENOL) tablet 650 mg  650 mg Oral Q6H PRN Etta Quill, DO       Or  . acetaminophen (TYLENOL) suppository 650 mg  650 mg Rectal Q6H PRN Etta Quill, DO      . albuterol (VENTOLIN HFA) 108 (90 Base) MCG/ACT inhaler 2 puff  2 puff Inhalation Q6H PRN Etta Quill, DO      . aspirin EC tablet 81 mg  81 mg Oral Daily Jennette Kettle M, DO      . Calcium Carb-Cholecalciferol 600-800 MG-UNIT TABS 1 tablet  1 tablet Oral Daily Alcario Drought, Jared M, DO      . carbidopa-levodopa (SINEMET IR) 25-100 MG per tablet immediate release 2 tablet  2 tablet Oral BID Etta Quill, DO      . cefTRIAXone (ROCEPHIN) 1 g in sodium chloride 0.9 % 100 mL IVPB  1 g Intravenous Q24H Mesner, Corene Cornea, MD   Stopped at 04/19/20 0305  . docusate sodium (COLACE) capsule 100 mg  100 mg Oral QHS Jennette Kettle M, DO      . ezetimibe (ZETIA) tablet 10 mg  10 mg Oral Daily Jennette Kettle M, DO      . hydrocortisone sodium succinate (SOLU-CORTEF) 100 MG injection 50 mg  50 mg Intravenous Q8H Jennette Kettle M, DO   50 mg at 04/19/20 0547  . lansoprazole (PREVACID SOLUTAB) disintegrating tablet 30 mg  30 mg Oral Q1200 Etta Quill, DO      . levothyroxine (SYNTHROID) tablet 50 mcg  50 mcg Oral QAC breakfast Jennette Kettle M, DO      . lubiprostone (AMITIZA) capsule 24 mcg  24 mcg Oral Daily Alcario Drought, Jared M, DO      . multivitamin with minerals tablet 1 tablet  1 tablet Oral Daily Alcario Drought, Jared M, DO      . ondansetron [facility]) tablet 4 mg  4 mg Oral Q6H PRN Etta Quill, DO       Or  . ondansetron [facility]) injection 4 mg  4 mg Intravenous Q6H PRN Etta Quill, DO      . phenylephrine (NEOSYNEPHRINE) 10-0.9 MG/250ML-% infusion  25-200 mcg/min Intravenous Titrated Etta Quill, DO      . Polyethyl Glycol-Propyl Glycol 0.4-0.3 % SOLN 1 drop  1 drop Ophthalmic BID PRN Etta Quill, DO      . pramipexole (MIRAPEX) tablet 1 mg  1 mg Oral  BID Jennette Kettle M, DO      . QUEtiapine (SEROQUEL) tablet 12.5-25 mg  12.5-25 mg Oral See admin instructions Etta Quill, DO      . rosuvastatin (CRESTOR) tablet 20 mg  20 mg Oral Daily Alcario Drought, Jared M, DO      . saccharomyces boulardii (FLORASTOR) capsule 250 mg  250 mg Oral BID Alcario Drought,  Toy Care, DO      . senna-docusate (Senokot-S) tablet 1 tablet  1 tablet Oral QHS Jennette Kettle M, DO      . sertraline (ZOLOFT) tablet 75 mg  75 mg Oral Daily Etta Quill, DO      . Vitamin D CAPS 2,000 Units  2,000 Units Oral Daily Etta Quill, DO       Current Outpatient Medications  Medication Sig Dispense Refill  . acetaminophen (TYLENOL) 500 MG tablet Take 500 mg by mouth daily as needed for mild pain.     Marland Kitchen acetaminophen (TYLENOL) 500 MG tablet Take 500 mg by mouth 2 (two) times daily.    Marland Kitchen albuterol (PROVENTIL HFA;VENTOLIN HFA) 108 (90 Base) MCG/ACT inhaler Inhale 2 puffs into the lungs every 6 (six) hours as needed for wheezing or shortness of breath. 1 Inhaler 6  . aspirin EC 81 MG tablet Take 81 mg by mouth daily.     . Calcium Carb-Cholecalciferol (CALCIUM 600/VITAMIN D3) 600-800 MG-UNIT TABS Take 1 tablet by mouth daily.    . carbidopa-levodopa (SINEMET IR) 25-100 MG tablet Take 2 tablets by mouth in the morning and at bedtime.     . Cholecalciferol (VITAMIN D) 2000 units CAPS Take 2,000 Units by mouth daily.    . clotrimazole-betamethasone (LOTRISONE) cream Apply 1 application topically 2 (two) times daily. 30 g 0  . docusate sodium (COLACE) 100 MG capsule Take 100 mg by mouth at bedtime.    Marland Kitchen ezetimibe (ZETIA) 10 MG tablet TAKE 1 TABLET ONCE DAILY. 90 tablet 0  . Ferrous Sulfate (SLOW FE) 142 (45 Fe) MG TBCR Take 1 tablet by mouth. Daily    . hydrocortisone 2.5 % cream Apply 1 application topically 2 (two) times daily.    . hydroxychloroquine (PLAQUENIL) 200 MG tablet Take 200 mg by mouth daily.    . lansoprazole (PREVACID SOLUTAB) 30 MG disintegrating tablet Take 30 mg by  mouth daily at 12 noon.    Marland Kitchen levothyroxine (SYNTHROID, LEVOTHROID) 50 MCG tablet Take 50 mcg by mouth daily before breakfast.     . lubiprostone (AMITIZA) 24 MCG capsule Take 24 mcg by mouth daily.     . Multiple Vitamin (MULTIVITAMIN WITH MINERALS) TABS tablet Take 1 tablet by mouth daily.    . Multiple Vitamins-Minerals (PRESERVISION AREDS 2) CHEW Chew 1 tablet by mouth daily.     . nitrofurantoin, macrocrystal-monohydrate, (MACROBID) 100 MG capsule Take 100 mg by mouth 2 (two) times daily.    Marland Kitchen nystatin (MYCOSTATIN/NYSTOP) powder Apply topically 2 (two) times daily as needed. (Patient taking differently: Apply topically 2 (two) times daily as needed (dermatitis). ) 15 g 5  . Polyethyl Glycol-Propyl Glycol (SYSTANE) 0.4-0.3 % SOLN Apply 1 drop to eye 2 (two) times daily as needed (dry eyes).     . pramipexole (MIRAPEX) 1 MG tablet Take 1 mg by mouth 2 (two) times daily.    . QUEtiapine (SEROQUEL) 25 MG tablet Take 12.5-25 mg by mouth See admin instructions. Take 12.5 mg in Am and 25 mg in PM    . rosuvastatin (CRESTOR) 20 MG tablet Take 1 tablet (20 mg total) by mouth daily. 90 tablet 3  . saccharomyces boulardii (FLORASTOR) 250 MG capsule Take 250 mg by mouth 2 (two) times daily.    Marland Kitchen senna-docusate (SENOKOT-S) 8.6-50 MG tablet Take 1 tablet by mouth at bedtime.     . sertraline (ZOLOFT) 50 MG tablet Take 75 mg by mouth daily. Take 1.5 tablets to = 75  mg    . Skin Protectants, Misc. (EUCERIN) cream Apply 1 application topically daily.     Marland Kitchen Spacer/Aero-Holding Chambers (AEROCHAMBER MV) inhaler by Other route. Use as instructed as needed    . predniSONE (DELTASONE) 1 MG tablet Take 3 mg by mouth daily with breakfast. 3 tabs x5 days      Allergies: Allergies  Allergen Reactions  . Adrenalone   . Lactose Other (See Comments)    abd pain, lactose intolerant  . Morphine Other (See Comments)  . Sulfa Antibiotics Other (See Comments)    Headache, very sick  . Codeine Other (See Comments)     Reaction:  Headaches and nightmares   . Lactose Intolerance (Gi) Nausea And Vomiting  . Latex Rash  . Lyrica [Pregabalin] Swelling and Other (See Comments)    Reaction:  Leg swelling  . Other Other (See Comments)    Pt states that pain medications give her nightmares.    . Plaquenil [Hydroxychloroquine Sulfate] Other (See Comments)    Reaction:  GI upset   . Reglan [Metoclopramide] Other (See Comments)    Reaction:  GI upset   . Requip [Ropinirole Hcl] Other (See Comments)    Reaction:  GI upset   . Septra [Sulfamethoxazole-Trimethoprim] Nausea And Vomiting  . Shellfish Allergy Nausea And Vomiting    Social History: Social History   Tobacco Use  . Smoking status: Never Smoker  . Smokeless tobacco: Never Used  Substance Use Topics  . Alcohol use: No    Alcohol/week: 0.0 standard drinks  . Drug use: No    Family History Family History  Problem Relation Age of Onset  . Heart attack Mother   . Restless legs syndrome Mother   . Heart failure Mother   . Heart disease Mother   . Hypertension Mother   . COPD Father     Review of Systems 10 systems were reviewed and are negative except as noted specifically in the HPI.  Objective   Vital signs in last 24 hours: BP (!) 72/48   Pulse 90   Temp 99 F (37.2 C) (Oral)   Resp (!) 26   Ht 5\' 2"  (1.575 m)   Wt 65.3 kg   SpO2 97%   BMI 26.34 kg/m   Physical Exam General: Elderly and frail, responsive to questions but often inaccurate.  Not entirely lucid  HEENT: [place]/AT, EOMI, MMM Pulmonary: Normal work of breathing Cardiovascular: Tachycardic and hypotensive Abdomen: Soft, nondistended, mild tenderness in the right lower quadrant GU: Right CVA tenderness Extremities: warm and well perfused  Most Recent Labs: Lab Results  Component Value Date   WBC 1.0 (LL) 04/19/2020   HGB 9.1 (L) 04/19/2020   HCT 29.5 (L) 04/19/2020   PLT 131 (L) 04/19/2020    Lab Results  Component Value Date   NA 138 04/19/2020   K 3.4  (L) 04/19/2020   CL 107 04/19/2020   CO2 19 (L) 04/19/2020   BUN 29 (H) 04/19/2020   CREATININE 1.64 (H) 04/19/2020   CALCIUM 7.7 (L) 04/19/2020    Lab Results  Component Value Date   INR 1.3 (H) 04/19/2020   APTT 29 04/19/2020    IMAGING: DG Chest Port 1 View  Result Date: 04/19/2020 CLINICAL DATA:  Shortness of breath and fever EXAM: PORTABLE CHEST 1 VIEW COMPARISON:  Chest radiograph 01/15/2019 FINDINGS: The heart size and mediastinal contours are within normal limits. Both lungs are clear. The visualized skeletal structures are unremarkable. Spinal stimulator leads overlie the midthoracic  spine. IMPRESSION: No active disease. Electronically Signed   By: Ulyses Jarred M.D.   On: 04/19/2020 02:35   CT RENAL STONE STUDY  Result Date: 04/19/2020 CLINICAL DATA:  Severe sepsis.  Rule out hydronephrosis EXAM: CT ABDOMEN AND PELVIS WITHOUT CONTRAST TECHNIQUE: Multidetector CT imaging of the abdomen and pelvis was performed following the standard protocol without IV contrast. COMPARISON:  01/02/2019 FINDINGS: Lower chest: Trace pleural effusions and lower lobe atelectasis. Extensive coronary atherosclerosis. Small hiatal hernia. Hepatobiliary: No focal liver abnormality.No evidence of biliary obstruction or stone. Pancreas: Mild generalized atrophy. Spleen: Unremarkable. Adrenals/Urinary Tract: Negative adrenals. Right hydroureteronephrosis due to a obstructing 11 x 7 mm stone at the pelvic inlet. Irregular 7 mm interpolar calculus in the left kidney. Two small lower pole calculi on the right. Unremarkable bladder. Stomach/Bowel:  No obstruction. Moderate rectal stool. Vascular/Lymphatic: Atherosclerosis no acute vascular abnormality. No mass or adenopathy. Reproductive:Hysterectomy Other: No ascites or pneumoperitoneum. Musculoskeletal: Lumbar fusion from L2-S1. Severe adjacent segment degeneration at T12-L1 and especially L1-2 where there is retrolisthesis and spinal/foraminal impingement. A spinal  stimulator is present. IMPRESSION: 1. Obstructing 11 x 7 mm stone in the right ureter at the level of the pelvic inlet. 2. Bilateral nephrolithiasis. 3. Incidental findings are noted above. Electronically Signed   By: Monte Fantasia M.D.   On: 04/19/2020 06:29    ------  Assessment:  86 y.o. female with history of aortic stenosis, just of heart failure and nephrolithiasis who presented with sepsis, right lower quadrant and right flank pain and a CT scan that demonstrates a 15 mm right mid ureteral stone with associated proximal hydroureteronephrosis.  Sepsis protocol has been activated.  She is currently receiving IV fluids.  Blood cultures and urine cultures are pending.  Does have a history of steroid use.  Not on anticoagulation per chart review as well as history from husband.  Husband is at bedside, discussed code status.  Does not want chest compressions but does agree that she would want temporary intubation and pressors if needed.  We discussed the indications for acute intervention including infected obstruction.  We discussed this in the setting of her sepsis as well as stone size, the most assured way to achieve urinary collecting system decompression would be with percutaneous nephrostomy tube.    Interventional radiology has been contacted, they will plan to place right percutaneous nephrostomy tube.  Recommendations: 1. Consult  2. Plan for emergent right PCN 3. Keep patient NPO 4. Send urine culture at this time, prior to [place] with sepsis protocol and broad antibiotics   Thank you for this consult. Please contact the urology consult pager with any further questions/concerns.

## 2020-04-19 NOTE — Progress Notes (Signed)
PHARMACY NOTE:  ANTIMICROBIAL RENAL DOSAGE ADJUSTMENT  Current antimicrobial regimen includes a mismatch between antimicrobial dosage and estimated renal function.  As per policy approved by the Pharmacy & Therapeutics and Medical Executive Committees, the antimicrobial dosage will be adjusted accordingly.  Current antimicrobial dosage:  Ceftriaxone 1g IV q24h  Indication: Urosepsis.  Obstructing right renal ston, plan for (R)PCN placement  Renal Function: Estimated Creatinine Clearance: 21.8 mL/min (A) (by C-G formula based on SCr of 1.64 mg/dL (H)).    Antimicrobial dosage has been changed to:  Ceftriaxone 2g IV q24h  Additional comments: Ascencion Dike, PA-C, Verbal order: substitute second Ceftriaxone 1g IV dose (total dose 2g) instead of pre-procedure Cefazolin.   Thank you for allowing pharmacy to be a part of this patient's care.  Gretta Arab PharmD, BCPS Clinical Pharmacist [facility] main pharmacy [phone] 04/19/2020 8:48 AM

## 2020-04-19 NOTE — ED Notes (Signed)
Patient transported to IR 

## 2020-04-19 NOTE — Progress Notes (Addendum)
CT scan confirmed 11x69mm obstructing R ureteral stone with R hydro.  This is new compared to Jan 2020.  Urology seeing pt at bedside.  Now on phone with Dr. Annamaria Boots (IR) as I type this note.  Getting 3rd L bolus now due to MAP in mid 68s still.  Spoke with family: pt is DNR, but would want temporary intubation or pressors if needed.  Suspect she may well end up on pressors, but will see what 3rd L does first.  Going emergently for intervention on the infected stone "in about an hour" according to urologist discussion with IR.  Will notify PCCM to have them let day person whos coming on at 7 (in 10 mins) know that pt probably may end up needing consult I suspect.  Update: Spoke with Dr. Chase Caller 1) Start neo 2) get multiple IVs 3) their APP is coming to evaluate

## 2020-04-19 NOTE — Progress Notes (Signed)
Chief Complaint: Patient was seen in consultation today for (R)PCN   Referring Physician(s): Dr. Jennette Kettle  Supervising Physician: Sandi Mariscal  Patient Status: [facility] - In-pt  History of Present Illness: [name] is a 86 y.o. female being admitted with urosepsis from obstructing right renal stone with ydro. She is now on IVF and pressors for hypotension. IR is asked to place (R)perc neph tube for decompression. PMHx, meds, labs, imaging, allergies reviewed. Has been NPO today as directed. Family at bedside.   Past Medical History:  Diagnosis Date  . Anemia   . Anxiety   . Aortic stenosis    moderate by echo 2021   . Arthritis   . Broken arm    right   . Chronic diastolic CHF (congestive heart failure) ([place])   . Family history of adverse reaction to anesthesia    pt. states sister vomits  . Fibromyalgia   . Fracture of thumb 09/01/2015   right  . Fracture, foot 09/01/2015   right  . GERD (gastroesophageal reflux disease)   . H/O hiatal hernia   . History of bronchitis   . History of kidney stones   . Hypercholesterolemia   . Hypertension    dr t turner  . Hypothyroidism   . Neuropathy   . Osteoporosis   . Parkinson disease ([place])   . PVD (peripheral vascular disease) ([facility])    99% stenosis of left anteiror tibial artery, mod stenosis of left distal SFA and popliteal artery followed by [name]  . RBBB    noted on EKG 2018  . Restless leg   . Shortness of breath    with exertion - chronic   . Sjogren's disease ([place])   . SOB (shortness of breath)    chronic due to diastolic dysfunction, deconditioning, obesity  . Tremor     Past Surgical History:  Procedure Laterality Date  . ABDOMINAL HYSTERECTOMY    . BACK SURGERY     4 back surgeries,   lumbar fusion  . CARDIAC CATHETERIZATION  2006   normal  . CYSTOSCOPY/URETEROSCOPY/HOLMIUM LASER/STENT PLACEMENT Left 01/06/2019   Procedure: CYSTOSCOPY/RETROGRADE/URETEROSCOPY/HOLMIUM LASER/BASKET  RETRIEVAL/STENT PLACEMENT;  Surgeon: Ceasar Mons, MD;  Location: [facility];  Service: Urology;  Laterality: Left;  . EYE SURGERY Bilateral    cateracts  . falls     variious fall, broken wrist,and toes  . FRACTURE SURGERY Right April 2016   Wrist, Pt. fell  . kidney stone removal Left 01/20/2019  . RIGHT/LEFT HEART CATH AND CORONARY ANGIOGRAPHY N/A 02/05/2018   Procedure: RIGHT/LEFT HEART CATH AND CORONARY ANGIOGRAPHY;  Surgeon: Troy Sine, MD;  Location: [facility];  Service: Cardiovascular;  Laterality: N/A;  . SPINAL CORD STIMULATOR INSERTION N/A 09/09/2015   Procedure: LUMBAR SPINAL CORD STIMULATOR INSERTION;  Surgeon: Clydell Hakim, MD;  Location: [place] [facility];  Service: Neurosurgery;  Laterality: N/A;  LUMBAR SPINAL CORD STIMULATOR INSERTION  . SPINE SURGERY  April 2013   Back X's 4  . TOTAL HIP ARTHROPLASTY     right  . WRIST FRACTURE SURGERY Bilateral     Allergies: Adrenalone, Lactose, Morphine, Sulfa antibiotics, Codeine, Lactose intolerance (gi), Latex, Lyrica [pregabalin], Other, Plaquenil [hydroxychloroquine sulfate], Reglan [metoclopramide], Requip [ropinirole hcl], Septra [sulfamethoxazole-trimethoprim], and Shellfish allergy  Medications:  Current Facility-Administered Medications:  .  0.9 %  sodium chloride infusion, , Intravenous, Continuous, Desai, Rahul P, PA-C, Stopped at 04/19/20 0715 .  0.9 %  sodium chloride infusion, 250 mL, Intravenous, Continuous, Alcario Drought,  Toy Care, DO, Last Rate: 150 mL/hr at 04/19/20 0717, 250 mL at 04/19/20 0717 .  acetaminophen (TYLENOL) tablet 650 mg, 650 mg, Oral, Q6H PRN **OR** acetaminophen (TYLENOL) suppository 650 mg, 650 mg, Rectal, Q6H PRN, Etta Quill, DO .  albuterol (VENTOLIN HFA) 108 (90 Base) MCG/ACT inhaler 2 puff, 2 puff, Inhalation, Q6H PRN, Etta Quill, DO .  aspirin EC tablet 81 mg, 81 mg, Oral, Daily, Alcario Drought, Jared M, DO .  Calcium Carb-Cholecalciferol 600-800 MG-UNIT TABS 1 tablet, 1  tablet, Oral, Daily, Alcario Drought, Jared M, DO .  carbidopa-levodopa (SINEMET IR) 25-100 MG per tablet immediate release 2 tablet, 2 tablet, Oral, BID, Alcario Drought, Jared M, DO .  ceFAZolin (ANCEF) IVPB 2g/100 mL premix, 2 g, Intravenous, Once, Bhavin Monjaraz, PA-C .  cefTRIAXone (ROCEPHIN) 1 g in sodium chloride 0.9 % 100 mL IVPB, 1 g, Intravenous, Q24H, Mesner, Corene Cornea, MD, Stopped at 04/19/20 0305 .  docusate sodium (COLACE) capsule 100 mg, 100 mg, Oral, QHS, Alcario Drought, Jared M, DO .  ezetimibe (ZETIA) tablet 10 mg, 10 mg, Oral, Daily, Alcario Drought, Jared M, DO .  hydrocortisone sodium succinate (SOLU-CORTEF) 100 MG injection 50 mg, 50 mg, Intravenous, Q8H, Gardner, Jared M, DO, 50 mg at 04/19/20 0547 .  lansoprazole (PREVACID SOLUTAB) disintegrating tablet 30 mg, 30 mg, Oral, Q1200, Alcario Drought, Jared M, DO .  levothyroxine (SYNTHROID) tablet 50 mcg, 50 mcg, Oral, QAC breakfast, Alcario Drought, Jared M, DO .  lubiprostone (AMITIZA) capsule 24 mcg, 24 mcg, Oral, Daily, Alcario Drought, Jared M, DO .  multivitamin with minerals tablet 1 tablet, 1 tablet, Oral, Daily, Alcario Drought, Jared M, DO .  ondansetron (ZOFRAN) tablet 4 mg, 4 mg, Oral, Q6H PRN **OR** ondansetron (ZOFRAN) injection 4 mg, 4 mg, Intravenous, Q6H PRN, Alcario Drought, Jared M, DO .  phenylephrine (NEOSYNEPHRINE) 10-0.9 MG/250ML-% infusion, 25-200 mcg/min, Intravenous, Titrated, Etta Quill, DO, Last Rate: 52.5 mL/hr at 04/19/20 0826, 35 mcg/min at 04/19/20 0826 .  Polyethyl Glycol-Propyl Glycol 0.4-0.3 % SOLN 1 drop, 1 drop, Ophthalmic, BID PRN, Etta Quill, DO .  potassium chloride 20 MEQ/15ML (10%) solution 20 mEq, 20 mEq, Oral, Once, Desai, Rahul P, PA-C .  pramipexole (MIRAPEX) tablet 1 mg, 1 mg, Oral, BID, Alcario Drought, Jared M, DO .  QUEtiapine (SEROQUEL) tablet 12.5-25 mg, 12.5-25 mg, Oral, See admin instructions, Alcario Drought, Jared M, DO .  rosuvastatin (CRESTOR) tablet 20 mg, 20 mg, Oral, Daily, Alcario Drought, Jared M, DO .  saccharomyces boulardii (FLORASTOR) capsule 250  mg, 250 mg, Oral, BID, Alcario Drought, Jared M, DO .  senna-docusate (Senokot-S) tablet 1 tablet, 1 tablet, Oral, QHS, Gardner, Jared M, DO .  sertraline (ZOLOFT) tablet 75 mg, 75 mg, Oral, Daily, Jennette Kettle M, DO .  Vitamin D CAPS 2,000 Units, 2,000 Units, Oral, Daily, Etta Quill, DO  Current Outpatient Medications:  .  acetaminophen (TYLENOL) 500 MG tablet, Take 500 mg by mouth daily as needed for mild pain. , Disp: , Rfl:  .  acetaminophen (TYLENOL) 500 MG tablet, Take 500 mg by mouth 2 (two) times daily., Disp: , Rfl:  .  albuterol (PROVENTIL HFA;VENTOLIN HFA) 108 (90 Base) MCG/ACT inhaler, Inhale 2 puffs into the lungs every 6 (six) hours as needed for wheezing or shortness of breath., Disp: 1 Inhaler, Rfl: 6 .  aspirin EC 81 MG tablet, Take 81 mg by mouth daily. , Disp: , Rfl:  .  Calcium Carb-Cholecalciferol (CALCIUM 600/VITAMIN D3) 600-800 MG-UNIT TABS, Take 1 tablet by mouth daily., Disp: , Rfl:  .  carbidopa-levodopa (SINEMET  IR) 25-100 MG tablet, Take 2 tablets by mouth in the morning and at bedtime. , Disp: , Rfl:  .  Cholecalciferol (VITAMIN D) 2000 units CAPS, Take 2,000 Units by mouth daily., Disp: , Rfl:  .  clotrimazole-betamethasone (LOTRISONE) cream, Apply 1 application topically 2 (two) times daily., Disp: 30 g, Rfl: 0 .  docusate sodium (COLACE) 100 MG capsule, Take 100 mg by mouth at bedtime., Disp: , Rfl:  .  ezetimibe (ZETIA) 10 MG tablet, TAKE 1 TABLET ONCE DAILY., Disp: 90 tablet, Rfl: 0 .  Ferrous Sulfate (SLOW FE) 142 (45 Fe) MG TBCR, Take 1 tablet by mouth. Daily, Disp: , Rfl:  .  hydrocortisone 2.5 % cream, Apply 1 application topically 2 (two) times daily., Disp: , Rfl:  .  hydroxychloroquine (PLAQUENIL) 200 MG tablet, Take 200 mg by mouth daily., Disp: , Rfl:  .  lansoprazole (PREVACID SOLUTAB) 30 MG disintegrating tablet, Take 30 mg by mouth daily at 12 noon., Disp: , Rfl:  .  levothyroxine (SYNTHROID, LEVOTHROID) 50 MCG tablet, Take 50 mcg by mouth daily  before breakfast. , Disp: , Rfl:  .  lubiprostone (AMITIZA) 24 MCG capsule, Take 24 mcg by mouth daily. , Disp: , Rfl:  .  Multiple Vitamin (MULTIVITAMIN WITH MINERALS) TABS tablet, Take 1 tablet by mouth daily., Disp: , Rfl:  .  Multiple Vitamins-Minerals (PRESERVISION AREDS 2) CHEW, Chew 1 tablet by mouth daily. , Disp: , Rfl:  .  nitrofurantoin, macrocrystal-monohydrate, (MACROBID) 100 MG capsule, Take 100 mg by mouth 2 (two) times daily., Disp: , Rfl:  .  nystatin (MYCOSTATIN/NYSTOP) powder, Apply topically 2 (two) times daily as needed. (Patient taking differently: Apply topically 2 (two) times daily as needed (dermatitis). ), Disp: 15 g, Rfl: 5 .  Polyethyl Glycol-Propyl Glycol (SYSTANE) 0.4-0.3 % SOLN, Apply 1 drop to eye 2 (two) times daily as needed (dry eyes). , Disp: , Rfl:  .  pramipexole (MIRAPEX) 1 MG tablet, Take 1 mg by mouth 2 (two) times daily., Disp: , Rfl:  .  QUEtiapine (SEROQUEL) 25 MG tablet, Take 12.5-25 mg by mouth See admin instructions. Take 12.5 mg in Am and 25 mg in PM, Disp: , Rfl:  .  rosuvastatin (CRESTOR) 20 MG tablet, Take 1 tablet (20 mg total) by mouth daily., Disp: 90 tablet, Rfl: 3 .  saccharomyces boulardii (FLORASTOR) 250 MG capsule, Take 250 mg by mouth 2 (two) times daily., Disp: , Rfl:  .  senna-docusate (SENOKOT-S) 8.6-50 MG tablet, Take 1 tablet by mouth at bedtime. , Disp: , Rfl:  .  sertraline (ZOLOFT) 50 MG tablet, Take 75 mg by mouth daily. Take 1.5 tablets to = 75 mg, Disp: , Rfl:  .  Skin Protectants, Misc. (EUCERIN) cream, Apply 1 application topically daily. , Disp: , Rfl:  .  Spacer/Aero-Holding Chambers (AEROCHAMBER MV) inhaler, by Other route. Use as instructed as needed, Disp: , Rfl:  .  predniSONE (DELTASONE) 1 MG tablet, Take 3 mg by mouth daily with breakfast. 3 tabs x5 days, Disp: , Rfl:     Family History  Problem Relation Age of Onset  . Heart attack Mother   . Restless legs syndrome Mother   . Heart failure Mother   . Heart  disease Mother   . Hypertension Mother   . COPD Father     Social History   Socioeconomic History  . Marital status: Married    Spouse name: [name]  . Number of children: 2  . Years of education: HS  .  Highest education level: Not on file  Occupational History    Comment: Homemaker  Tobacco Use  . Smoking status: Never Smoker  . Smokeless tobacco: Never Used  Substance and Sexual Activity  . Alcohol use: No    Alcohol/week: 0.0 standard drinks  . Drug use: No  . Sexual activity: Never  Other Topics Concern  . Not on file  Social History Narrative   Patient lives at home with her spouse. Married in 1957. Two people lives in the home, no pets.    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    Caffeine Use: tea   Social Determinants of Health   Financial Resource Strain:   . Difficulty of Paying Living Expenses:   Food Insecurity:   . Worried About Charity fundraiser in the Last Year:   . Arboriculturist in the Last Year:   Transportation Needs:   . Film/video editor (Medical):   Marland Kitchen Lack of Transportation (Non-Medical):   Physical Activity:   . Days of Exercise per Week:   . Minutes of Exercise per Session:   Stress:   . Feeling of Stress :   Social Connections:   . Frequency of Communication with Friends and Family:   . Frequency of Social Gatherings with Friends and Family:   . Attends Religious Services:   . Active Member of Clubs or Organizations:   . Attends Archivist Meetings:   Marland Kitchen Marital Status:      Review of Systems: A 12 point ROS discussed and pertinent positives are indicated in the HPI above.  All other systems are negative.  Review of Systems  Vital Signs: BP (!) 73/48   Pulse 87   Temp 99 F (37.2 C) (Oral)   Resp (!) 33   Ht 5\' 2"  (1.575 m)   Wt 65.3 kg   SpO2 97%   BMI 26.34 kg/m   Physical Exam Constitutional:      Appearance: She is ill-appearing. She is not  toxic-appearing.  HENT:     Mouth/Throat:     Mouth: Mucous membranes are moist.     Pharynx: Oropharynx is clear.  Cardiovascular:     Rate and Rhythm: Normal rate and regular rhythm.     Heart sounds: Normal heart sounds.  Pulmonary:     Effort: Pulmonary effort is normal. No respiratory distress.     Breath sounds: Normal breath sounds.  Abdominal:     General: Abdomen is flat. There is no distension.     Palpations: Abdomen is soft.     Tenderness: There is no abdominal tenderness.  Skin:    General: Skin is warm and dry.  Neurological:     General: No focal deficit present.     Mental Status: She is alert and oriented to person, place, and time.       Imaging: DG Chest Port 1 View  Result Date: 04/19/2020 CLINICAL DATA:  Shortness of breath and fever EXAM: PORTABLE CHEST 1 VIEW COMPARISON:  Chest radiograph 01/15/2019 FINDINGS: The heart size and mediastinal contours are within normal limits. Both lungs are clear. The visualized skeletal structures are unremarkable. Spinal stimulator leads overlie the midthoracic spine. IMPRESSION: No active disease. Electronically Signed   By: Ulyses Jarred M.D.   On: 04/19/2020 02:35   CT RENAL STONE STUDY  Result Date: 04/19/2020 CLINICAL DATA:  Severe sepsis.  Rule out hydronephrosis EXAM: CT ABDOMEN AND  PELVIS WITHOUT CONTRAST TECHNIQUE: Multidetector CT imaging of the abdomen and pelvis was performed following the standard protocol without IV contrast. COMPARISON:  01/02/2019 FINDINGS: Lower chest: Trace pleural effusions and lower lobe atelectasis. Extensive coronary atherosclerosis. Small hiatal hernia. Hepatobiliary: No focal liver abnormality.No evidence of biliary obstruction or stone. Pancreas: Mild generalized atrophy. Spleen: Unremarkable. Adrenals/Urinary Tract: Negative adrenals. Right hydroureteronephrosis due to a obstructing 11 x 7 mm stone at the pelvic inlet. Irregular 7 mm interpolar calculus in the left kidney. Two small  lower pole calculi on the right. Unremarkable bladder. Stomach/Bowel:  No obstruction. Moderate rectal stool. Vascular/Lymphatic: Atherosclerosis no acute vascular abnormality. No mass or adenopathy. Reproductive:Hysterectomy Other: No ascites or pneumoperitoneum. Musculoskeletal: Lumbar fusion from L2-S1. Severe adjacent segment degeneration at T12-L1 and especially L1-2 where there is retrolisthesis and spinal/foraminal impingement. A spinal stimulator is present. IMPRESSION: 1. Obstructing 11 x 7 mm stone in the right ureter at the level of the pelvic inlet. 2. Bilateral nephrolithiasis. 3. Incidental findings are noted above. Electronically Signed   By: Monte Fantasia M.D.   On: 04/19/2020 06:29    Labs:  CBC: Recent Labs    02/03/20 0000 02/03/20 0000 02/12/20 0000 03/31/20 0000 04/15/20 0000 04/19/20 0200  WBC 7.8   < > 7.5 13.9 17.6 1.0*  HGB 11.3*   < > 11.0* 10.9* 9.6* 9.1*  HCT 34*   < > 33* 33* 29* 29.5*  PLT 337  --   --  267 173 131*   < > = values in this interval not displayed.    COAGS: Recent Labs    04/19/20 0200  INR 1.3*  APTT 29    BMP: Recent Labs    05/26/19 0906 05/26/19 0906 07/24/19 0000 07/24/19 0000 09/08/19 0000 12/15/19 0000 04/15/20 0000 04/19/20 0200  NA 143  --  142   < > 144 141 138 138  K 4.1   < > 4.3   < > 3.5 3.9 3.8 3.4*  CL 110  --  110  --   --  107 104 107  CO2 24  --  24  --   --  27* 25* 19*  GLUCOSE 97  --   --   --   --   --   --  108*  BUN 13  --  12   < > 10 16 26* 29*  CALCIUM 9.0  --  9.0  --   --  8.6* 8.7 7.7*  CREATININE 0.65  --  0.7   < > 0.7 0.7 1.3* 1.64*  GFRNONAA  --   --  79  --   --   --   --  28*  GFRAA  --   --   --   --   --   --   --  32*   < > = values in this interval not displayed.    LIVER FUNCTION TESTS: Recent Labs    07/14/19 0811 07/14/19 0811 07/24/19 0000 09/08/19 0000 04/15/20 0000 04/19/20 0200  BILITOT 0.3  --   --   --   --  0.6  AST 21   < > 43* 23 19 53*  ALT 7   < > 5* 12  3* 20  ALKPHOS 56   < > 0.4* 46 47 99  PROT 6.1  --  5.8  --   --  5.4*  ALBUMIN 4.3  --  3.7  --  3.5 2.7*   < > =  values in this interval not displayed.    TUMOR MARKERS: No results for input(s): AFPTM, CEA, CA199, CHROMGRNA in the last 8760 hours.  Assessment and Plan: Urosepsis Obstructing right renal stone. Imaging and labs reviewed. Plan for (R)PCN placement BP low, started on pressors, improved to 74/48 at last check. Discussed with pt and husband that we not be able to fully sedate her safely for the procedure. Risks and benefits of right PCN placement was discussed with the patient including, but not limited to, infection, bleeding, significant bleeding causing loss or decrease in renal function or damage to adjacent structures.   All of the patient's questions were answered, patient is agreeable to proceed.  Consent signed and in chart  Thank you for this interesting consult.  I greatly enjoyed meeting JARYN CAUGHELL and look forward to participating in their care.  A copy of this report was sent to the requesting provider on this date.  Electronically Signed: Ascencion Dike, PA-C 04/19/2020, 8:25 AM   I spent a total of 20 minutes in face to face in clinical consultation, greater than 50% of which was counseling/coordinating care for right PCN

## 2020-04-19 NOTE — H&P (Addendum)
History and Physical    [name] [id number] DOB: 01-Nov-1931 DOA: 04/19/2020  PCP: [name], MD  Patient coming from: SNF  I have personally briefly reviewed patient's old medical records in [place]  Chief Complaint: Fever  HPI: [name] is a 86 y.o. female with medical history significant of mod AS, HFpEF, recurrent UTIs, Sjogren's syndrome recently started on plaquenil and prednisone which she had been taking being tapered.  Pt recently dealing with symptoms of UTI for past 10 days.  Was started on empirical levaquin for UTI given symptoms on 5/7.  Culture would come back with E.Coli > 100k cfu/ml, Intermediate to levaquin so she was switched yesterday to macrobid.  WBC on 5/7 was 17.6k.   ED Course: now septic with Tm 103.9, HR 115, RR 44, improved to 17 after fever improved with tylenol.  BP 96/53 after 3L LR bolus in ED.  WBC 1.0!  Creat 1.6 up from 0.7 baseline.  Lactate 3.1 initially, 2.8 on repeat.  Urine grossly infected: mod LE, positive nitrites > 50 WBC and RBC.  CXR neg.   Review of Systems: As per HPI, otherwise all review of systems negative.  Past Medical History:  Diagnosis Date  . Anemia   . Anxiety   . Aortic stenosis    moderate by echo 2021   . Arthritis   . Broken arm    right   . Chronic diastolic CHF (congestive heart failure) ([place])   . Family history of adverse reaction to anesthesia    pt. states sister vomits  . Fibromyalgia   . Fracture of thumb 09/01/2015   right  . Fracture, foot 09/01/2015   right  . GERD (gastroesophageal reflux disease)   . H/O hiatal hernia   . History of bronchitis   . History of kidney stones   . Hypercholesterolemia   . Hypertension    dr t turner  . Hypothyroidism   . Neuropathy   . Osteoporosis   . Parkinson disease ([place])   . PVD (peripheral vascular disease) (HCC)    99% stenosis of left anteiror tibial artery, mod stenosis of left distal SFA and popliteal  artery followed by [name]  . RBBB    noted on EKG 2018  . Restless leg   . Shortness of breath    with exertion - chronic   . Sjogren's disease ([place])   . SOB (shortness of breath)    chronic due to diastolic dysfunction, deconditioning, obesity  . Tremor     Past Surgical History:  Procedure Laterality Date  . ABDOMINAL HYSTERECTOMY    . BACK SURGERY     4 back surgeries,   lumbar fusion  . CARDIAC CATHETERIZATION  2006   normal  . CYSTOSCOPY/URETEROSCOPY/HOLMIUM LASER/STENT PLACEMENT Left 01/06/2019   Procedure: CYSTOSCOPY/RETROGRADE/URETEROSCOPY/HOLMIUM LASER/BASKET RETRIEVAL/STENT PLACEMENT;  Surgeon: Ceasar Mons, MD;  Location: [facility];  Service: Urology;  Laterality: Left;  . EYE SURGERY Bilateral    cateracts  . falls     variious fall, broken wrist,and toes  . FRACTURE SURGERY Right April 2016   Wrist, Pt. fell  . kidney stone removal Left 01/20/2019  . RIGHT/LEFT HEART CATH AND CORONARY ANGIOGRAPHY N/A 02/05/2018   Procedure: RIGHT/LEFT HEART CATH AND CORONARY ANGIOGRAPHY;  Surgeon: Troy Sine, MD;  Location: [facility];  Service: Cardiovascular;  Laterality: N/A;  . SPINAL CORD STIMULATOR INSERTION N/A 09/09/2015   Procedure: LUMBAR SPINAL CORD STIMULATOR INSERTION;  Surgeon: Eddie Dibbles  Maryjean Ka, MD;  Location: [facility];  Service: Neurosurgery;  Laterality: N/A;  LUMBAR SPINAL CORD STIMULATOR INSERTION  . SPINE SURGERY  April 2013   Back X's 4  . TOTAL HIP ARTHROPLASTY     right  . WRIST FRACTURE SURGERY Bilateral      reports that she has never smoked. She has never used smokeless tobacco. She reports that she does not drink alcohol or use drugs.  Allergies  Allergen Reactions  . Adrenalone   . Lactose Other (See Comments)    abd pain, lactose intolerant  . Morphine Other (See Comments)  . Sulfa Antibiotics Other (See Comments)    Headache, very sick  . Codeine Other (See Comments)    Reaction:  Headaches and nightmares   . Lactose  Intolerance (Gi) Nausea And Vomiting  . Latex Rash  . Lyrica [Pregabalin] Swelling and Other (See Comments)    Reaction:  Leg swelling  . Other Other (See Comments)    Pt states that pain medications give her nightmares.    . Plaquenil [Hydroxychloroquine Sulfate] Other (See Comments)    Reaction:  GI upset   . Reglan [Metoclopramide] Other (See Comments)    Reaction:  GI upset   . Requip [Ropinirole Hcl] Other (See Comments)    Reaction:  GI upset   . Septra [Sulfamethoxazole-Trimethoprim] Nausea And Vomiting  . Shellfish Allergy Nausea And Vomiting    Family History  Problem Relation Age of Onset  . Heart attack Mother   . Restless legs syndrome Mother   . Heart failure Mother   . Heart disease Mother   . Hypertension Mother   . COPD Father      Prior to Admission medications   Medication Sig Start Date End Date Taking? Authorizing Provider  acetaminophen (TYLENOL) 500 MG tablet Take 500 mg by mouth daily as needed for mild pain.    Yes [provider]  acetaminophen (TYLENOL) 500 MG tablet Take 500 mg by mouth 2 (two) times daily.   Yes [provider]  albuterol (PROVENTIL HFA;VENTOLIN HFA) 108 (90 Base) MCG/ACT inhaler Inhale 2 puffs into the lungs every 6 (six) hours as needed for wheezing or shortness of breath. 01/15/19  Yes Collene Gobble, MD  aspirin EC 81 MG tablet Take 81 mg by mouth daily.    Yes [provider]  Calcium Carb-Cholecalciferol (CALCIUM 600/VITAMIN D3) 600-800 MG-UNIT TABS Take 1 tablet by mouth daily.   Yes [provider]  carbidopa-levodopa (SINEMET IR) 25-100 MG tablet Take 2 tablets by mouth in the morning and at bedtime.    Yes [provider]  Cholecalciferol (VITAMIN D) 2000 units CAPS Take 2,000 Units by mouth daily.   Yes [provider]  clotrimazole-betamethasone (LOTRISONE) cream Apply 1 application topically 2 (two) times daily. 04/11/20  Yes Trula Slade, DPM  docusate sodium  (COLACE) 100 MG capsule Take 100 mg by mouth at bedtime.   Yes [provider]  ezetimibe (ZETIA) 10 MG tablet TAKE 1 TABLET ONCE DAILY. 06/01/19  Yes [name], MD  Ferrous Sulfate (SLOW FE) 142 (45 Fe) MG TBCR Take 1 tablet by mouth. Daily   Yes [provider]  hydrocortisone 2.5 % cream Apply 1 application topically 2 (two) times daily.   Yes [provider]  hydroxychloroquine (PLAQUENIL) 200 MG tablet Take 200 mg by mouth daily.   Yes [provider]  lansoprazole (PREVACID SOLUTAB) 30 MG disintegrating tablet Take 30 mg by mouth daily  at 12 noon.   Yes [provider]  levothyroxine (SYNTHROID, LEVOTHROID) 50 MCG tablet Take 50 mcg by mouth daily before breakfast.    Yes [provider]  lubiprostone (AMITIZA) 24 MCG capsule Take 24 mcg by mouth daily.    Yes [provider]  Multiple Vitamin (MULTIVITAMIN WITH MINERALS) TABS tablet Take 1 tablet by mouth daily.   Yes [provider]  Multiple Vitamins-Minerals (PRESERVISION AREDS 2) CHEW Chew 1 tablet by mouth daily.    Yes [provider]  nitrofurantoin, macrocrystal-monohydrate, (MACROBID) 100 MG capsule Take 100 mg by mouth 2 (two) times daily.   Yes [provider]  nystatin (MYCOSTATIN/NYSTOP) powder Apply topically 2 (two) times daily as needed. Patient taking differently: Apply topically 2 (two) times daily as needed (dermatitis).  12/18/18  Yes [name], Man X, NP  Polyethyl Glycol-Propyl Glycol (SYSTANE) 0.4-0.3 % SOLN Apply 1 drop to eye 2 (two) times daily as needed (dry eyes).    Yes [provider]  pramipexole (MIRAPEX) 1 MG tablet Take 1 mg by mouth 2 (two) times daily.   Yes [provider]  QUEtiapine (SEROQUEL) 25 MG tablet Take 12.5-25 mg by mouth See admin instructions. Take 12.5 mg in Am and 25 mg in PM   Yes [provider]  rosuvastatin (CRESTOR) 20 MG tablet Take 1 tablet (20 mg total) by mouth daily.  05/01/19 04/30/20 Yes Turner, Eber Hong, MD  saccharomyces boulardii (FLORASTOR) 250 MG capsule Take 250 mg by mouth 2 (two) times daily.   Yes [provider]  senna-docusate (SENOKOT-S) 8.6-50 MG tablet Take 1 tablet by mouth at bedtime.    Yes [provider]  sertraline (ZOLOFT) 50 MG tablet Take 75 mg by mouth daily. Take 1.5 tablets to = 75 mg   Yes [provider]  Skin Protectants, Misc. (EUCERIN) cream Apply 1 application topically daily.    Yes [provider]  Spacer/Aero-Holding Chambers (AEROCHAMBER MV) inhaler by Other route. Use as instructed as needed   Yes [provider]  predniSONE (DELTASONE) 1 MG tablet Take 3 mg by mouth daily with breakfast. 3 tabs x5 days 04/15/20 04/19/20  [provider]    Physical Exam: Vitals:   04/19/20 0300 04/19/20 0338 04/19/20 0413 04/19/20 0430  BP: (!) 123/92 125/61  (!) 96/53  Pulse: (!) 115 (!) 112  (!) 103  Resp: (!) 30 (!) 38  17  Temp:   99 F (37.2 C)   TempSrc:   Oral   SpO2: 98% 95%  96%  Weight:      Height:        Constitutional: NAD, calm, comfortable Eyes: PERRL, lids and conjunctivae normal ENMT: Mucous membranes are moist. Posterior pharynx clear of any exudate or lesions.Normal dentition.  Neck: normal, supple, no masses, no thyromegaly Respiratory: clear to auscultation bilaterally, no wheezing, no crackles. Normal respiratory effort. No accessory muscle use.  Cardiovascular: Regular rate and rhythm, no murmurs / rubs / gallops. No extremity edema. 2+ pedal pulses. No carotid bruits.  Abdomen: no tenderness, no masses palpated. No hepatosplenomegaly. Bowel sounds positive.  Musculoskeletal: no clubbing / cyanosis. No joint deformity upper and lower extremities. Good ROM, no contractures. Normal muscle tone.  Skin: no rashes, lesions, ulcers. No induration Neurologic: CN 2-12 grossly intact. Sensation intact, DTR normal. Strength 5/5 in all 4.  Psychiatric: Normal  judgment and insight. Alert and oriented x 3. Normal mood.    Labs on Admission: I have personally reviewed following  labs and imaging studies  CBC: Recent Labs  Lab 04/15/20 0000 04/19/20 0200  WBC 17.6 1.0*  NEUTROABS  --  0.7*  HGB 9.6* 9.1*  HCT 29* 29.5*  MCV  --  101.4*  PLT 173 [id number]*   Basic Metabolic Panel: Recent Labs  Lab 04/15/20 0000 04/19/20 0200  NA 138 138  K 3.8 3.4*  CL 104 107  CO2 25* 19*  GLUCOSE  --  108*  BUN 26* 29*  CREATININE 1.3* 1.64*  CALCIUM 8.7 7.7*   GFR: Estimated Creatinine Clearance: 21.8 mL/min (A) (by C-G formula based on SCr of 1.64 mg/dL (H)). Liver Function Tests: Recent Labs  Lab 04/15/20 0000 04/19/20 0200  AST 19 53*  ALT 3* 20  ALKPHOS 47 99  BILITOT  --  0.6  PROT  --  5.4*  ALBUMIN 3.5 2.7*   No results for input(s): LIPASE, AMYLASE in the last 168 hours. No results for input(s): AMMONIA in the last 168 hours. Coagulation Profile: Recent Labs  Lab 04/19/20 0200  INR 1.3*   Cardiac Enzymes: No results for input(s): CKTOTAL, CKMB, CKMBINDEX, TROPONINI in the last 168 hours. BNP (last 3 results) No results for input(s): PROBNP in the last 8760 hours. HbA1C: No results for input(s): HGBA1C in the last 72 hours. CBG: Recent Labs  Lab 04/19/20 0200  GLUCAP 94   Lipid Profile: No results for input(s): CHOL, HDL, LDLCALC, TRIG, CHOLHDL, LDLDIRECT in the last 72 hours. Thyroid Function Tests: No results for input(s): TSH, T4TOTAL, FREET4, T3FREE, THYROIDAB in the last 72 hours. Anemia Panel: No results for input(s): VITAMINB12, FOLATE, FERRITIN, TIBC, IRON, RETICCTPCT in the last 72 hours. Urine analysis:    Component Value Date/Time   COLORURINE AMBER (A) 04/19/2020 0200   APPEARANCEUR HAZY (A) 04/19/2020 0200   LABSPEC 1.013 04/19/2020 0200   PHURINE 5.0 04/19/2020 0200   GLUCOSEU NEGATIVE 04/19/2020 0200   HGBUR MODERATE (A) 04/19/2020 0200   BILIRUBINUR NEGATIVE 04/19/2020 0200   KETONESUR NEGATIVE  04/19/2020 0200   PROTEINUR 30 (A) 04/19/2020 0200   UROBILINOGEN 0.2 07/17/2012 1705   NITRITE POSITIVE (A) 04/19/2020 0200   LEUKOCYTESUR MODERATE (A) 04/19/2020 0200    Radiological Exams on Admission: DG Chest Port 1 View  Result Date: 04/19/2020 CLINICAL DATA:  Shortness of breath and fever EXAM: PORTABLE CHEST 1 VIEW COMPARISON:  Chest radiograph 01/15/2019 FINDINGS: The heart size and mediastinal contours are within normal limits. Both lungs are clear. The visualized skeletal structures are unremarkable. Spinal stimulator leads overlie the midthoracic spine. IMPRESSION: No active disease. Electronically Signed   By: Ulyses Jarred M.D.   On: 04/19/2020 02:35    EKG: Independently reviewed.  Assessment/Plan Principal Problem:   Severe sepsis (HCC) Active Problems:   Acute lower UTI   Erosive (osteo)arthritis   Acute kidney injury ([place])    1. Severe sepsis due to E.Coli UTI - 1. BCx pending 2. UCx pending 3. Empiric rocephin started in ED 4. IVF: 3L LR bolus then NS at 125 cc/hr 5. Tele monitor 6. Empiric stress dose steroids given h/o chronic steroid use. 7. Cortisol level ordered 8. CT renal stone protocol to r/o hydronephrosis - call urology emergently if present. 2. AKI - 1. Secondary to #1 above 2. Getting CT renal stone protocol 3. Erosive osteoarthritis / sjogren's - 1. Hold plaquenil 2. Hold home prednisone 3. Empiric stress dose steroids for the moment  DVT prophylaxis: Lovenox Code Status: DNR - per SNF note from yesterday, also has an  ACP DNR on file here at [facility], also confirmed with family at bedside Family Communication: Family at bedside Disposition Plan: SNF after stabilized from sepsis standpoint Consults called: None Admission status: Admit to inpatient  Severity of Illness: The appropriate patient status for this patient is INPATIENT. Inpatient status is judged to be reasonable and necessary in order to provide the required intensity of service to  ensure the patient's safety. The patient's presenting symptoms, physical exam findings, and initial radiographic and laboratory data in the context of their chronic comorbidities is felt to place them at high risk for further clinical deterioration. Furthermore, it is not anticipated that the patient will be medically stable for discharge from the hospital within 2 midnights of admission. The following factors support the patient status of inpatient.   IP status due to: 1) sepsis with hypotension and AKI from E.Coli UTI 2) failed outpt treatment (and progressed to sepsis) for UTI.   * I certify that at the point of admission it is my clinical judgment that the patient will require inpatient hospital care spanning beyond 2 midnights from the point of admission due to high intensity of service, high risk for further deterioration and high frequency of surveillance required.*    Shaneal Barasch M. DO [facility]  How to contact the [facility] Attending or Consulting provider [place] or covering provider during after hours [place], for this patient?  1. Check the care team in [facility] and look for a) attending/consulting [facility] provider listed and b) the [facility] team listed 2. Log into [email or web address]  Amion Physician Scheduling and messaging for groups and whole hospitals  On call and physician scheduling software for group practices, residents, hospitalists and other medical providers for call, clinic, rotation and shift schedules. On[employer] is a hospital-wide system for scheduling doctors and paging doctors on call. EasyPlot is for scientific plotting and data analysis.  [email or web address]  and use [place]'s universal password to access. If you do not have the password, please contact the hospital operator.  3. Locate the [facility] provider you are looking for under [facility] and page to a number that you can be directly reached. 4. If you still have difficulty reaching the provider, please page the  [facility] (Director on Call) for the Hospitalists listed on amion for assistance.  04/19/2020, 5:10 AM

## 2020-04-20 ENCOUNTER — Inpatient Hospital Stay (HOSPITAL_COMMUNITY): Payer: Medicare Other

## 2020-04-20 DIAGNOSIS — I350 Nonrheumatic aortic (valve) stenosis: Secondary | ICD-10-CM

## 2020-04-20 DIAGNOSIS — I340 Nonrheumatic mitral (valve) insufficiency: Secondary | ICD-10-CM

## 2020-04-20 LAB — TROPONIN I (HIGH SENSITIVITY)
Troponin I (High Sensitivity): 346 ng/L (ref <18)
Troponin I (High Sensitivity): 297 ng/L (ref <18)
Troponin I (High Sensitivity): 281 ng/L (ref <18)
Troponin I (High Sensitivity): 263 ng/L (ref <18)

## 2020-04-20 LAB — COMPREHENSIVE METABOLIC PANEL
ALT: 18 U/L (ref 0–44)
AST: 117 U/L — ABNORMAL HIGH (ref 15–41)
Albumin: 2.3 g/dL — ABNORMAL LOW (ref 3.5–5.0)
Alkaline Phosphatase: 95 U/L (ref 38–126)
Anion gap: 12 (ref 5–15)
BUN: 28 mg/dL — ABNORMAL HIGH (ref 8–23)
CO2: 17 mmol/L — ABNORMAL LOW (ref 22–32)
Calcium: 6.9 mg/dL — ABNORMAL LOW (ref 8.9–10.3)
Chloride: 114 mmol/L — ABNORMAL HIGH (ref 98–111)
Creatinine, Ser: 1.47 mg/dL — ABNORMAL HIGH (ref 0.44–1.00)
GFR calc Af Amer: 37 mL/min — ABNORMAL LOW (ref >60)
GFR calc non Af Amer: 32 mL/min — ABNORMAL LOW (ref >60)
Glucose, Bld: 108 mg/dL — ABNORMAL HIGH (ref 70–99)
Potassium: 4 mmol/L (ref 3.5–5.1)
Sodium: 143 mmol/L (ref 135–145)
Total Bilirubin: 0.5 mg/dL (ref 0.3–1.2)
Total Protein: 5.1 g/dL — ABNORMAL LOW (ref 6.5–8.1)

## 2020-04-20 LAB — ECHOCARDIOGRAM COMPLETE
Height: 62 in
Weight: 2515.01 oz

## 2020-04-20 LAB — URINE CULTURE: Culture: NO GROWTH

## 2020-04-20 LAB — CBC
HCT: 24.9 % — ABNORMAL LOW (ref 36.0–46.0)
Hemoglobin: 7.8 g/dL — ABNORMAL LOW (ref 12.0–15.0)
MCH: 31.1 pg (ref 26.0–34.0)
MCHC: 31.3 g/dL (ref 30.0–36.0)
MCV: 99.2 fL (ref 80.0–100.0)
Platelets: 136 10*3/uL — ABNORMAL LOW (ref 150–400)
RBC: 2.51 MIL/uL — ABNORMAL LOW (ref 3.87–5.11)
RDW: 14.7 % (ref 11.5–15.5)
WBC: 20.3 10*3/uL — ABNORMAL HIGH (ref 4.0–10.5)
nRBC: 0 % (ref 0.0–0.2)

## 2020-04-20 LAB — LACTIC ACID, PLASMA: Lactic Acid, Venous: 1.4 mmol/L (ref 0.5–1.9)

## 2020-04-20 LAB — PHOSPHORUS: Phosphorus: 3.6 mg/dL (ref 2.5–4.6)

## 2020-04-20 LAB — MAGNESIUM: Magnesium: 1.5 mg/dL — ABNORMAL LOW (ref 1.7–2.4)

## 2020-04-20 MED ORDER — MAGNESIUM SULFATE 2 GM/50ML IV SOLN
2.0000 g | Freq: Once | INTRAVENOUS | Status: AC
  Administered 2020-04-20: 2 g via INTRAVENOUS
  Filled 2020-04-20: qty 50

## 2020-04-20 MED ORDER — SODIUM CHLORIDE 0.9% FLUSH
10.0000 mL | Freq: Two times a day (BID) | INTRAVENOUS | Status: DC
  Administered 2020-04-20 – 2020-04-25 (×8): 10 mL

## 2020-04-20 MED ORDER — SODIUM CHLORIDE 0.9% FLUSH
10.0000 mL | INTRAVENOUS | Status: DC | PRN
  Administered 2020-04-22: 10 mL

## 2020-04-20 MED ORDER — SODIUM CHLORIDE 0.9% FLUSH
5.0000 mL | Freq: Three times a day (TID) | INTRAVENOUS | Status: DC
  Administered 2020-04-20 – 2020-04-25 (×13): 5 mL

## 2020-04-20 MED ORDER — LACTATED RINGERS IV BOLUS
500.0000 mL | Freq: Once | INTRAVENOUS | Status: AC
  Administered 2020-04-20: 500 mL via INTRAVENOUS

## 2020-04-20 NOTE — Progress Notes (Addendum)
NAME:  [name], MRN:  [id number], DOB:  [birth year], LOS: 1 ADMISSION DATE:  04/19/2020, CONSULTATION DATE:  04/19/20 REFERRING MD:  Alcario Drought  CHIEF COMPLAINT:  Fever   Brief History   [name] is a 86 y.o. female who was admitted 5/11 with septic shock due to UTI 2/2 right renal obstructing calculus.  She required phenylephrine in ED.  Pt is s/p IR percutaneous nephrostomy tube placement (5/11).   Past Medical History  Sjogren's  RLS PVD Parkinson Disease Hypothyroidism  Renal Calculi  Hiatal Hernia GERD Chronic Diastolic CHF  Aortic Stenosis  Anemia  Anxiety   Significant Hospital Events   5/11 Admit  Consults:  PCCM, IR, Urology  Procedures:  R Perc Nephrostomy 5/11 >>   Significant Diagnostic Tests:   CT renal 5/11 > obstructing 11 x 7 mm right ureteral stone with hydroureteronephrosis, 40mm left renal calculus.  ECHO 5/12 >>   Micro Data:  Blood 5/11 >  Urine 5/11 > 100k GNR >>  Flu 5/11 > neg COVID 5/11 > neg  Antimicrobials:  Ceftriaxone 5/11 >    Interim history/subjective:  Afebrile  I/O 1.9L UOP / +366 ml for 24 hours  On RA Off neo since 5pm on 5/11 Pt reports discomfort with urination   Objective:  Blood pressure (!) 99/58, pulse 83, temperature 98.2 F (36.8 C), temperature source Oral, resp. rate (!) 27, height 5\' 2"  (1.575 m), weight 71.3 kg, SpO2 98 %.        Intake/Output Summary (Last 24 hours) at 04/20/2020 0755 Last data filed at 04/20/2020 0600 Gross per 24 hour  Intake 2321 ml  Output 1955 ml  Net 366 ml   Filed Weights   04/19/20 0157 04/19/20 1730  Weight: 65.3 kg 71.3 kg    Examination: General: pleasant elderly female lying in bed, husband at bedside   HEENT: MM pink/moist, good dentition, anicteric, wears glasses  Neuro: AAOx4, speech clear, MAE CV: s1s2 RRR, SEM heard best at 2nd ICS RSB PULM:  Non-labored on RA, lungs bilaterally clear  GI: soft, bsx4 active  GU: bloody drainage from perc  nephrostomy drain Extremities: warm/dry, no edema  Skin: no rashes or lesions  Assessment & Plan:   Septic Shock secondary to GNR UTI in setting Obstructing Right Ureteral Stone.   Seen as outpatient and cultures grew E.coli (Per report, initially started on levaquin then switched to macrobid 5/7 after sensitivities returned. Unable to confirm this in the chart as notes only indicate macrobid).  Now on ceftriaxone & s/p perc nephrostomy tube placement 5/11. -follow urine culture -continue empiric ceftriaxone -stress dose steroids given chronic prednisone use  -appreciate IR assistance with patient care.  Anticipate she will discharge with drain in place and return for outpatient follow up   AKI Hypokalemia Hypomagnesemia  -Trend BMP / urinary output -Replace electrolytes as indicated, 2gm Mg+ 5/12 -Avoid nephrotoxic agents, ensure adequate renal perfusion -note corrected calcium of 8.3  Mild Transaminitis  In setting of shock  -follow LFT's   Elevated Troponin  Known RBBB Suspect demand ischemia in the setting of shock -repeat troponin now to ensure not rising  -no change in admit EKG, known right bundle.  No ST changes  -follow up EKG in am  -await ECHO findings to ensure no new wall motion abnormalities   Hx Sjogrens  On plaquenil and prednisone. -hold plaquenil  -stress dose steroids as above   Hx hypothyroidism. -continue synthroid    Hx GERD -PPI  Hx HTN, HLD, RBB, dCHF, AS. -continue home ASA, ezetimibe, rosuvastatin  -tele monitoring   Hx depression, visual hallucinations, fibromyalgia, anxiety, parkinsons, RLS -continue home sinemet, quetiapine, sertraline   Best Practice:  Diet: NPO. Pain/Anxiety/Delirium protocol (if indicated): N/A. VAP protocol (if indicated): N/A. DVT prophylaxis: SCD's / Heparin. GI prophylaxis: N/A. Glucose control: SSI if glucose consistently > 180. Mobility: Bedrest. Code Status: Limited Code - OK with temporary intubation  and pressors as needed but no CPR / defibrillations. Family Communication: husband and patient updated at bedside 5/12 Disposition: SDU, to [facility] as of 5/13 am.   Labs   CBC: Recent Labs  Lab 04/15/20 0000 04/19/20 0200 04/19/20 1442 04/20/20 0020  WBC 17.6 1.0* 24.2* 20.3*  NEUTROABS  --  0.7*  --   --   HGB 9.6* 9.1* 8.0* 7.8*  HCT 29* 29.5* 25.3* 24.9*  MCV  --  101.4* 99.6 99.2  PLT 173 131* 163 [id number]*   Basic Metabolic Panel: Recent Labs  Lab 04/15/20 0000 04/19/20 0200 04/19/20 1442 04/20/20 0455  NA 138 138 142 143  K 3.8 3.4* 3.7 4.0  CL 104 107 114* 114*  CO2 25* 19* 17* 17*  GLUCOSE  --  108* 102* 108*  BUN 26* 29* 26* 28*  CREATININE 1.3* 1.64* 1.63* 1.47*  CALCIUM 8.7 7.7* 6.8* 6.9*  MG  --   --   --  1.5*  PHOS  --   --   --  3.6   GFR: Estimated Creatinine Clearance: 25.4 mL/min (A) (by C-G formula based on SCr of 1.47 mg/dL (H)). Recent Labs  Lab 04/15/20 0000 04/19/20 0200 04/19/20 0400 04/19/20 0505 04/19/20 1442 04/20/20 0020 04/20/20 0455  PROCALCITON  --   --   --  45.94  --   --   --   WBC 17.6 1.0*  --   --  24.2* 20.3*  --   LATICACIDVEN  --  3.1* 2.8*  --  2.0*  --  1.4   Liver Function Tests: Recent Labs  Lab 04/15/20 0000 04/19/20 0200 04/19/20 1442 04/20/20 0455  AST 19 53* 217* 117*  ALT 3* 20 77* 18  ALKPHOS 47 99 109 95  BILITOT  --  0.6 0.5 0.5  PROT  --  5.4* 5.1* 5.1*  ALBUMIN 3.5 2.7* 2.3* 2.3*   No results for input(s): LIPASE, AMYLASE in the last 168 hours. No results for input(s): AMMONIA in the last 168 hours. ABG    Component Value Date/Time   PHART 7.382 02/05/2018 1452   PCO2ART 36.7 02/05/2018 1452   PO2ART 110.0 (H) 02/05/2018 1452   HCO3 21.8 02/05/2018 1452   TCO2 23 02/05/2018 1452   ACIDBASEDEF 3.0 (H) 02/05/2018 1452   O2SAT 98.0 02/05/2018 1452    Coagulation Profile: Recent Labs  Lab 04/19/20 0200  INR 1.3*   Cardiac Enzymes: No results for input(s): CKTOTAL, CKMB, CKMBINDEX, TROPONINI  in the last 168 hours. HbA1C: No results found for: HGBA1C CBG: Recent Labs  Lab 04/19/20 0200  GLUCAP 94      Critical care time: n/a   Noe Gens, MSN, NP-C [facility] 04/20/2020, 7:55 AM   Please see [email or web address] for pager details.

## 2020-04-20 NOTE — Progress Notes (Signed)
MEDICATION-RELATED CONSULT NOTE   IR Procedure Consult - Anticoagulant/Antiplatelet PTA/Inpatient Med List Review by Pharmacist    Procedure: placement of a right sided PCN     Completed: 04/19/20 09:47  Post-Procedural bleeding risk per IR MD assessment:  standard  Antithrombotic medications on inpatient or PTA profile prior to procedure:   Aspirin 81mg  daily    Recommended restart time per IR Post-Procedure Guidelines:     Other considerations:     Plan:    Aspirin order was not ordered to be stopped before procedure, therefore will continue as ordered   Peggyann Juba, PharmD, BCPS Pharmacy: [phone] 04/20/2020 11:06 AM

## 2020-04-20 NOTE — Progress Notes (Signed)
Referring Physician(s): Gardner,J/Wrenn,J  Supervising Physician: Markus Daft  Patient Status:  [facility] - In-pt  Chief Complaint: Abdominal pain, right kidney stone, sepsis, right hydronephrosis   Subjective: Pt doing ok this am; husband at bedside; some discomfort with voiding and along rt flank, afebrile   Allergies: Adrenalone, Lactose, Morphine, Sulfa antibiotics, Codeine, Lactose intolerance (gi), Latex, Lyrica [pregabalin], Other, Plaquenil [hydroxychloroquine sulfate], Reglan [metoclopramide], Requip [ropinirole hcl], Septra [sulfamethoxazole-trimethoprim], and Shellfish allergy  Medications: Prior to Admission medications   Medication Sig Start Date End Date Taking? Authorizing Provider  acetaminophen (TYLENOL) 500 MG tablet Take 500 mg by mouth daily as needed for mild pain.    Yes [provider]  acetaminophen (TYLENOL) 500 MG tablet Take 500 mg by mouth 2 (two) times daily.   Yes [provider]  albuterol (PROVENTIL HFA;VENTOLIN HFA) 108 (90 Base) MCG/ACT inhaler Inhale 2 puffs into the lungs every 6 (six) hours as needed for wheezing or shortness of breath. 01/15/19  Yes Collene Gobble, MD  aspirin EC 81 MG tablet Take 81 mg by mouth daily.    Yes [provider]  Calcium Carb-Cholecalciferol (CALCIUM 600/VITAMIN D3) 600-800 MG-UNIT TABS Take 1 tablet by mouth daily.   Yes [provider]  carbidopa-levodopa (SINEMET IR) 25-100 MG tablet Take 2 tablets by mouth in the morning and at bedtime.    Yes [provider]  Cholecalciferol (VITAMIN D) 2000 units CAPS Take 2,000 Units by mouth daily.   Yes [provider]  clotrimazole-betamethasone (LOTRISONE) cream Apply 1 application topically 2 (two) times daily. 04/11/20  Yes Trula Slade, DPM  docusate sodium (COLACE) 100 MG capsule Take 100 mg by mouth at bedtime.   Yes [provider]  ezetimibe (ZETIA) 10 MG tablet TAKE 1 TABLET ONCE DAILY. 06/01/19  Yes  [name], MD  Ferrous Sulfate (SLOW FE) 142 (45 Fe) MG TBCR Take 1 tablet by mouth. Daily   Yes [provider]  hydrocortisone 2.5 % cream Apply 1 application topically 2 (two) times daily.   Yes [provider]  hydroxychloroquine (PLAQUENIL) 200 MG tablet Take 200 mg by mouth daily.   Yes [provider]  lansoprazole (PREVACID SOLUTAB) 30 MG disintegrating tablet Take 30 mg by mouth daily at 12 noon.   Yes [provider]  levothyroxine (SYNTHROID, LEVOTHROID) 50 MCG tablet Take 50 mcg by mouth daily before breakfast.    Yes [provider]  lubiprostone (AMITIZA) 24 MCG capsule Take 24 mcg by mouth daily.    Yes [provider]  Multiple Vitamin (MULTIVITAMIN WITH MINERALS) TABS tablet Take 1 tablet by mouth daily.   Yes [provider]  Multiple Vitamins-Minerals (PRESERVISION AREDS 2) CHEW Chew 1 tablet by mouth daily.    Yes [provider]  nitrofurantoin, macrocrystal-monohydrate, (MACROBID) 100 MG capsule Take 100 mg by mouth 2 (two) times daily.   Yes [provider]  nystatin (MYCOSTATIN/NYSTOP) powder Apply topically 2 (two) times daily as needed. Patient taking differently: Apply topically 2 (two) times daily as needed (dermatitis).  12/18/18  Yes [name], Man X, NP  Polyethyl Glycol-Propyl Glycol (SYSTANE) 0.4-0.3 % SOLN Apply 1 drop to eye 2 (two) times daily as needed (dry eyes).    Yes [provider]  pramipexole (MIRAPEX) 1 MG tablet Take 1 mg by mouth 2 (two) times daily.   Yes [provider]  QUEtiapine (SEROQUEL) 25 MG tablet Take 12.5-25 mg by mouth See admin instructions. Take 12.5 mg in  Am and 25 mg in PM   Yes [provider]  rosuvastatin (CRESTOR) 20 MG tablet Take 1 tablet (20 mg total) by mouth daily. 05/01/19 04/30/20 Yes Turner, Eber Hong, MD  saccharomyces boulardii (FLORASTOR) 250 MG capsule Take 250 mg by mouth 2 (two) times daily.   Yes [provider]  senna-docusate (SENOKOT-S) 8.6-50 MG tablet Take 1 tablet by mouth at bedtime.    Yes [provider]  sertraline (ZOLOFT) 50 MG tablet Take 75 mg by mouth daily. Take 1.5 tablets to = 75 mg   Yes [provider]  Skin Protectants, Misc. (EUCERIN) cream Apply 1 application topically daily.    Yes [provider]  Spacer/Aero-Holding Chambers (AEROCHAMBER MV) inhaler by Other route. Use as instructed as needed   Yes [provider]     Vital Signs: BP (!) 99/58 (BP Location: Left Arm)   Pulse 83   Temp (!) 97.5 F (36.4 C) (Oral)   Resp (!) 27   Ht 5\' 2"  (1.575 m)   Wt 157 lb 3 oz (71.3 kg)   SpO2 98%   BMI 28.75 kg/m   Physical Exam awake,answers simple questions ok; rt PCN intact, dressing dry, site mildly tender, OP 730 cc bloody urine; drain flushed without difficulty  Imaging: DG Chest 1 View  Result Date: 04/19/2020 CLINICAL DATA:  Fever, tachypnea EXAM: CHEST  1 VIEW COMPARISON:  04/19/2020 FINDINGS: Heart is normal size. Mediastinal contours within normal limits. Spinal stimulator wires remain in stable position. No confluent airspace opacities or edema. Possible trace bilateral effusions. No acute bony abnormality. IMPRESSION: Possible trace bilateral effusions. Electronically Signed   By: Rolm Baptise M.D.   On: 04/19/2020 17:42   DG Chest Port 1 View  Result Date: 04/19/2020 CLINICAL DATA:  Shortness of breath and fever EXAM: PORTABLE CHEST 1 VIEW COMPARISON:  Chest radiograph 01/15/2019 FINDINGS: The heart size and mediastinal contours are within normal limits. Both lungs are clear. The visualized skeletal structures are unremarkable. Spinal stimulator leads overlie the midthoracic spine. IMPRESSION: No active disease. Electronically Signed   By: Ulyses Jarred M.D.   On: 04/19/2020 02:35   CT RENAL STONE STUDY  Result Date: 04/19/2020 CLINICAL DATA:  Severe sepsis.  Rule out hydronephrosis EXAM: CT ABDOMEN AND PELVIS WITHOUT CONTRAST  TECHNIQUE: Multidetector CT imaging of the abdomen and pelvis was performed following the standard protocol without IV contrast. COMPARISON:  01/02/2019 FINDINGS: Lower chest: Trace pleural effusions and lower lobe atelectasis. Extensive coronary atherosclerosis. Small hiatal hernia. Hepatobiliary: No focal liver abnormality.No evidence of biliary obstruction or stone. Pancreas: Mild generalized atrophy. Spleen: Unremarkable. Adrenals/Urinary Tract: Negative adrenals. Right hydroureteronephrosis due to a obstructing 11 x 7 mm stone at the pelvic inlet. Irregular 7 mm interpolar calculus in the left kidney. Two small lower pole calculi on the right. Unremarkable bladder. Stomach/Bowel:  No obstruction. Moderate rectal stool. Vascular/Lymphatic: Atherosclerosis no acute vascular abnormality. No mass or adenopathy. Reproductive:Hysterectomy Other: No ascites or pneumoperitoneum. Musculoskeletal: Lumbar fusion from L2-S1. Severe adjacent segment degeneration at T12-L1 and especially L1-2 where there is retrolisthesis and spinal/foraminal impingement. A spinal stimulator is present. IMPRESSION: 1. Obstructing 11 x 7 mm stone in the right ureter at the level of the pelvic inlet. 2. Bilateral nephrolithiasis. 3. Incidental findings are noted above. Electronically Signed   By: Monte Fantasia M.D.   On: 04/19/2020 06:29   IR NEPHROSTOMY PLACEMENT RIGHT  Result Date: 04/19/2020 INDICATION: Urinary sepsis secondary to right-sided hydronephrosis. EXAM: 1. ULTRASOUND GUIDANCE  FOR PUNCTURE OF THE RIGHT RENAL COLLECTING SYSTEM 2. RIGHT PERCUTANEOUS NEPHROSTOMY TUBE PLACEMENT. COMPARISON:  CT abdomen pelvis-earlier same day; 01/02/2019 MEDICATIONS: Rocephin 1 gm IV; The antibiotic was administered in an appropriate time frame prior to skin puncture. ANESTHESIA/SEDATION: Moderate (conscious) sedation was employed during this procedure. A total of Versed 0.5 mg was administered intravenously. Moderate Sedation Time: 14 minutes.  The patient's level of consciousness and vital signs were monitored continuously by radiology nursing throughout the procedure under my direct supervision. CONTRAST:  10 mL Isovue 300 - administered into the renal collecting system FLUOROSCOPY TIME:  1 minute, 36 seconds (79 mGy) COMPLICATIONS: None immediate. PROCEDURE: The procedure, risks, benefits, and alternatives were explained to the patient. Questions regarding the procedure were encouraged and answered. The patient understands and consents to the procedure. A timeout was performed prior to the initiation of the procedure. The right flank region was prepped and draped in the usual sterile fashion and a sterile drape was applied covering the operative field. A sterile gown and sterile gloves were used for the procedure. Local anesthesia was provided with 1% Lidocaine with epinephrine. Ultrasound was used to localize the right kidney. Under direct ultrasound guidance, a 20 gauge needle was advanced into the renal collecting system. An ultrasound image documentation was performed. Access within the collecting system was confirmed with the efflux of urine followed by limited contrast injection. Over a Nitrex wire, the tract was dilated with an Accustick stent. Next, under intermittent fluoroscopic guidance and over a short Amplatz wire, the track was dilated ultimately allowing placement of a 10-French percutaneous nephrostomy catheter which was advanced to the level of the renal pelvis where the coil was formed and locked. Contrast was injected and several spot fluoroscopic images were obtained in various obliquities. The catheter was secured at the skin with a Prolene retention suture and stat lock device and connected to a gravity bag was placed. Dressings were applied. The patient tolerated procedure well without immediate postprocedural complication. FINDINGS: Ultrasound scanning demonstrates a moderate to severely dilated right collecting system. Under a  combination of ultrasound and fluoroscopic guidance, a posterior inferior calix was targeted allowing placement of a 10-French percutaneous nephrostomy catheter with end coiled and locked within the renal pelvis. Contrast injection confirmed appropriate positioning. Limited contrast injection suggest questioned right proximal ureteral stone likely represents a calcified phlebolith as was suggested on more remote abdominal CT performed 01/02/2019. IMPRESSION: 1. Successful ultrasound and fluoroscopic guided placement of a right sided 10 [place] PCN. 2. Limited contrast injection confirms questioned proximal right ureteral/UPJ stone likely represents a calcified phleboliths as was suggested on more remote abdominal CT 01/02/2019. PLAN: As the etiology of the patient's right-sided urinary obstruction remains indeterminate, the patient may return for right-sided antegrade nephrostogram with potential right-sided ureteral stent placement in 2-3 weeks as indicated. Electronically Signed   By: Sandi Mariscal M.D.   On: 04/19/2020 10:47    Labs:  CBC: Recent Labs    04/15/20 0000 04/19/20 0200 04/19/20 1442 04/20/20 0020  WBC 17.6 1.0* 24.2* 20.3*  HGB 9.6* 9.1* 8.0* 7.8*  HCT 29* 29.5* 25.3* 24.9*  PLT 173 131* 163 136*    COAGS: Recent Labs    04/19/20 0200  INR 1.3*  APTT 29    BMP: Recent Labs    05/26/19 0906 05/26/19 0906 07/24/19 0000 09/08/19 0000 04/15/20 0000 04/19/20 0200 04/19/20 1442 04/20/20 0455  NA 143  --  142   < > 138 138 142 143  K 4.1   < >  4.3   < > 3.8 3.4* 3.7 4.0  CL 110  --  110   < > 104 107 114* 114*  CO2 24  --  24   < > 25* 19* 17* 17*  GLUCOSE 97  --   --   --   --  108* 102* 108*  BUN 13  --  12   < > 26* 29* 26* 28*  CALCIUM 9.0  --  9.0   < > 8.7 7.7* 6.8* 6.9*  CREATININE 0.65  --  0.7   < > 1.3* 1.64* 1.63* 1.47*  GFRNONAA  --   --  79  --   --  28* 28* 32*  GFRAA  --   --   --   --   --  32* 33* 37*   < > = values in this interval not displayed.     LIVER FUNCTION TESTS: Recent Labs    07/14/19 0811 07/14/19 0811 07/24/19 0000 09/08/19 0000 04/15/20 0000 04/19/20 0200 04/19/20 1442 04/20/20 0455  BILITOT 0.3  --   --   --   --  0.6 0.5 0.5  AST 21   < > 43*   < > 19 53* 217* 117*  ALT 7   < > 5*   < > 3* 20 77* 18  ALKPHOS 56   < > 0.4*   < > 47 99 109 95  PROT 6.1   < > 5.8  --   --  5.4* 5.1* 5.1*  ALBUMIN 4.3  --  3.7  --  3.5 2.7* 2.3* 2.3*   < > = values in this interval not displayed.    Assessment and Plan: Patient with history of urosepsis, nephrolithiasis with obstructing right ureteral stone/hydronephrosis, AKI, elevated troponin; status post right percutaneous nephrostomy on 5/11.  Afebrile, WBC 20.3 down from 24.2, hemoglobin 7.8 down from 8, creatinine 1.47, down from 1.63, troponin 297(346); urine culture pending, currently on IV Rocephin-check sensitivities; hydrate, maintain PCN for now- further plans as per urology/critical care   Electronically Signed: D. Rowe Robert, PA-C 04/20/2020, 10:29 AM   I spent a total of 15 minutes at the the patient's bedside AND on the patient's hospital floor or unit, greater than 50% of which was counseling/coordinating care for right percutaneous nephrostomy    Patient ID: [name], female   DOB: 11/21/1931, 86 y.o.   MRN: [id number]

## 2020-04-20 NOTE — Progress Notes (Signed)
[facility] Progress Note Patient Name: [name] DOB: [birth year] MRN: [id number]   Date of Service  04/20/2020  HPI/Events of Note  Troponin = 346. Patient is already on ASA 81 mg PO Q day. EKG: Sinus tachycardia Right bundle branch block  eICU Interventions  Continue to trend Troponin.     Intervention Category Major Interventions: Other:  Lysle Dingwall 04/20/2020, 6:05 AM

## 2020-04-20 NOTE — Progress Notes (Signed)
Urology Progress Note   Subjective: Right PCN placement yesterday. Aspirated foul smelling urine; growing GNRs.  Received lasix for fluid overload.  Trop elevated to 346; ICU trending. Echo ordered.  Transferred to ICU.   Weaned off vasopressors.  Facial edema improved.  Purewick in place.  Right PCN draining thin merlot colored fluid.   Afebrile. No tachycardia WBC improved to 20.3  Cr improved to 1.4  Objective: Vital signs in last 24 hours: Temp:  [97.5 F (36.4 C)-98.4 F (36.9 C)] 97.5 F (36.4 C) (05/12 0800) Pulse Rate:  [77-91] 83 (05/12 0422) Resp:  [17-35] 27 (05/12 0422) BP: (82-145)/(49-83) 99/58 (05/12 0422) SpO2:  [95 %-100 %] 98 % (05/12 0422) Weight:  [71.3 kg] 71.3 kg (05/11 1730)  Intake/Output from previous day: 05/11 0701 - 05/12 0700 In: 2321 [P.O.:240; I.V.:2081] Out: 1955 [id number] Intake/Output this shift: No intake/output data recorded.  Physical Exam:  General: Alert and oriented, improving CV: Regular rate Abdomen: Soft, nontender GU: Purewick in place draining clear yellow urine. Right PCN draining thin merlot colored urine.   Lab Results: Recent Labs    04/19/20 0200 04/19/20 1442 04/20/20 0020  HGB 9.1* 8.0* 7.8*  HCT 29.5* 25.3* 24.9*   Recent Labs    04/19/20 1442 04/20/20 0455  NA 142 143  K 3.7 4.0  CL 114* 114*  CO2 17* 17*  GLUCOSE 102* 108*  BUN 26* 28*  CREATININE 1.63* 1.47*  CALCIUM 6.8* 6.9*    Studies/Results: DG Chest 1 View  Result Date: 04/19/2020 CLINICAL DATA:  Fever, tachypnea EXAM: CHEST  1 VIEW COMPARISON:  04/19/2020 FINDINGS: Heart is normal size. Mediastinal contours within normal limits. Spinal stimulator wires remain in stable position. No confluent airspace opacities or edema. Possible trace bilateral effusions. No acute bony abnormality. IMPRESSION: Possible trace bilateral effusions. Electronically Signed   By: Rolm Baptise M.D.   On: 04/19/2020 17:42   DG Chest Port 1 View  Result  Date: 04/19/2020 CLINICAL DATA:  Shortness of breath and fever EXAM: PORTABLE CHEST 1 VIEW COMPARISON:  Chest radiograph 01/15/2019 FINDINGS: The heart size and mediastinal contours are within normal limits. Both lungs are clear. The visualized skeletal structures are unremarkable. Spinal stimulator leads overlie the midthoracic spine. IMPRESSION: No active disease. Electronically Signed   By: Ulyses Jarred M.D.   On: 04/19/2020 02:35   CT RENAL STONE STUDY  Result Date: 04/19/2020 CLINICAL DATA:  Severe sepsis.  Rule out hydronephrosis EXAM: CT ABDOMEN AND PELVIS WITHOUT CONTRAST TECHNIQUE: Multidetector CT imaging of the abdomen and pelvis was performed following the standard protocol without IV contrast. COMPARISON:  01/02/2019 FINDINGS: Lower chest: Trace pleural effusions and lower lobe atelectasis. Extensive coronary atherosclerosis. Small hiatal hernia. Hepatobiliary: No focal liver abnormality.No evidence of biliary obstruction or stone. Pancreas: Mild generalized atrophy. Spleen: Unremarkable. Adrenals/Urinary Tract: Negative adrenals. Right hydroureteronephrosis due to a obstructing 11 x 7 mm stone at the pelvic inlet. Irregular 7 mm interpolar calculus in the left kidney. Two small lower pole calculi on the right. Unremarkable bladder. Stomach/Bowel:  No obstruction. Moderate rectal stool. Vascular/Lymphatic: Atherosclerosis no acute vascular abnormality. No mass or adenopathy. Reproductive:Hysterectomy Other: No ascites or pneumoperitoneum. Musculoskeletal: Lumbar fusion from L2-S1. Severe adjacent segment degeneration at T12-L1 and especially L1-2 where there is retrolisthesis and spinal/foraminal impingement. A spinal stimulator is present. IMPRESSION: 1. Obstructing 11 x 7 mm stone in the right ureter at the level of the pelvic inlet. 2. Bilateral nephrolithiasis. 3. Incidental findings are noted above. Electronically Signed  By: Monte Fantasia M.D.   On: 04/19/2020 06:29   IR NEPHROSTOMY  PLACEMENT RIGHT  Result Date: 04/19/2020 INDICATION: Urinary sepsis secondary to right-sided hydronephrosis. EXAM: 1. ULTRASOUND GUIDANCE FOR PUNCTURE OF THE RIGHT RENAL COLLECTING SYSTEM 2. RIGHT PERCUTANEOUS NEPHROSTOMY TUBE PLACEMENT. COMPARISON:  CT abdomen pelvis-earlier same day; 01/02/2019 MEDICATIONS: Rocephin 1 gm IV; The antibiotic was administered in an appropriate time frame prior to skin puncture. ANESTHESIA/SEDATION: Moderate (conscious) sedation was employed during this procedure. A total of Versed 0.5 mg was administered intravenously. Moderate Sedation Time: 14 minutes. The patient's level of consciousness and vital signs were monitored continuously by radiology nursing throughout the procedure under my direct supervision. CONTRAST:  10 mL Isovue 300 - administered into the renal collecting system FLUOROSCOPY TIME:  1 minute, 36 seconds (79 mGy) COMPLICATIONS: None immediate. PROCEDURE: The procedure, risks, benefits, and alternatives were explained to the patient. Questions regarding the procedure were encouraged and answered. The patient understands and consents to the procedure. A timeout was performed prior to the initiation of the procedure. The right flank region was prepped and draped in the usual sterile fashion and a sterile drape was applied covering the operative field. A sterile gown and sterile gloves were used for the procedure. Local anesthesia was provided with 1% Lidocaine with epinephrine. Ultrasound was used to localize the right kidney. Under direct ultrasound guidance, a 20 gauge needle was advanced into the renal collecting system. An ultrasound image documentation was performed. Access within the collecting system was confirmed with the efflux of urine followed by limited contrast injection. Over a Nitrex wire, the tract was dilated with an Accustick stent. Next, under intermittent fluoroscopic guidance and over a short Amplatz wire, the track was dilated ultimately allowing  placement of a 10-French percutaneous nephrostomy catheter which was advanced to the level of the renal pelvis where the coil was formed and locked. Contrast was injected and several spot fluoroscopic images were obtained in various obliquities. The catheter was secured at the skin with a Prolene retention suture and stat lock device and connected to a gravity bag was placed. Dressings were applied. The patient tolerated procedure well without immediate postprocedural complication. FINDINGS: Ultrasound scanning demonstrates a moderate to severely dilated right collecting system. Under a combination of ultrasound and fluoroscopic guidance, a posterior inferior calix was targeted allowing placement of a 10-French percutaneous nephrostomy catheter with end coiled and locked within the renal pelvis. Contrast injection confirmed appropriate positioning. Limited contrast injection suggest questioned right proximal ureteral stone likely represents a calcified phlebolith as was suggested on more remote abdominal CT performed 01/02/2019. IMPRESSION: 1. Successful ultrasound and fluoroscopic guided placement of a right sided 10 [place] PCN. 2. Limited contrast injection confirms questioned proximal right ureteral/UPJ stone likely represents a calcified phleboliths as was suggested on more remote abdominal CT 01/02/2019. PLAN: As the etiology of the patient's right-sided urinary obstruction remains indeterminate, the patient may return for right-sided antegrade nephrostogram with potential right-sided ureteral stent placement in 2-3 weeks as indicated. Electronically Signed   By: Sandi Mariscal M.D.   On: 04/19/2020 10:47    Assessment/Plan:  86 y.o. female s/p right PCN placement for right ureteral obstruction in the setting of sepsis.   Overall improving.   Right ureteral obstruction with infection Presented with sepsis in the setting of right hydroureteronephrosis s/p right PCN with IR on 04/19/2020. Transferred to ICU  and weaned off vasopressors in <24hr.   - Continue right PCN - Continue strict I/Os - Agree with broad  spectrum antibiotics - Follow up urine culture from right PCN placement - Okay for diet from urologic perspective - No plans for further urologic interventions during this admission   LOS: 1 day

## 2020-04-20 NOTE — Progress Notes (Signed)
CRITICAL VALUE ALERT  Critical Value: Troponin: 346   Date & Time Notied:  04/20/2020 06:00 am   Provider Notified: E-link notified.  Orders Received/Actions taken: Troponin labs ordered

## 2020-04-20 NOTE — Progress Notes (Signed)
Echocardiogram 2D Echocardiogram has been performed.  [name] 04/20/2020, 9:01 AM

## 2020-04-21 DIAGNOSIS — M154 Erosive (osteo)arthritis: Secondary | ICD-10-CM

## 2020-04-21 LAB — COMPREHENSIVE METABOLIC PANEL
ALT: 8 U/L (ref 0–44)
AST: 50 U/L — ABNORMAL HIGH (ref 15–41)
Albumin: 2.3 g/dL — ABNORMAL LOW (ref 3.5–5.0)
Alkaline Phosphatase: 95 U/L (ref 38–126)
Anion gap: 8 (ref 5–15)
BUN: 35 mg/dL — ABNORMAL HIGH (ref 8–23)
CO2: 20 mmol/L — ABNORMAL LOW (ref 22–32)
Calcium: 7.2 mg/dL — ABNORMAL LOW (ref 8.9–10.3)
Chloride: 115 mmol/L — ABNORMAL HIGH (ref 98–111)
Creatinine, Ser: 1.23 mg/dL — ABNORMAL HIGH (ref 0.44–1.00)
GFR calc Af Amer: 46 mL/min — ABNORMAL LOW (ref >60)
GFR calc non Af Amer: 40 mL/min — ABNORMAL LOW (ref >60)
Glucose, Bld: 129 mg/dL — ABNORMAL HIGH (ref 70–99)
Potassium: 3.4 mmol/L — ABNORMAL LOW (ref 3.5–5.1)
Sodium: 143 mmol/L (ref 135–145)
Total Bilirubin: 0.5 mg/dL (ref 0.3–1.2)
Total Protein: 5.4 g/dL — ABNORMAL LOW (ref 6.5–8.1)

## 2020-04-21 LAB — CBC
HCT: 24.2 % — ABNORMAL LOW (ref 36.0–46.0)
Hemoglobin: 7.5 g/dL — ABNORMAL LOW (ref 12.0–15.0)
MCH: 31 pg (ref 26.0–34.0)
MCHC: 31 g/dL (ref 30.0–36.0)
MCV: 100 fL (ref 80.0–100.0)
Platelets: 136 10*3/uL — ABNORMAL LOW (ref 150–400)
RBC: 2.42 MIL/uL — ABNORMAL LOW (ref 3.87–5.11)
RDW: 14.9 % (ref 11.5–15.5)
WBC: 23.1 10*3/uL — ABNORMAL HIGH (ref 4.0–10.5)
nRBC: 0 % (ref 0.0–0.2)

## 2020-04-21 LAB — URINE CULTURE
Culture: >=100000 — AB
Special Requests: NORMAL

## 2020-04-21 LAB — MAGNESIUM: Magnesium: 3 mg/dL — ABNORMAL HIGH (ref 1.7–2.4)

## 2020-04-21 LAB — PHOSPHORUS: Phosphorus: 3.2 mg/dL (ref 2.5–4.6)

## 2020-04-21 MED ORDER — HYDROCORTISONE NA SUCCINATE PF 100 MG IJ SOLR
40.0000 mg | Freq: Two times a day (BID) | INTRAMUSCULAR | Status: DC
  Administered 2020-04-21 – 2020-04-22 (×2): 40 mg via INTRAVENOUS
  Filled 2020-04-21 (×2): qty 2

## 2020-04-21 MED ORDER — POTASSIUM CHLORIDE 10 MEQ/100ML IV SOLN
10.0000 meq | INTRAVENOUS | Status: AC
  Administered 2020-04-21 (×4): 10 meq via INTRAVENOUS
  Filled 2020-04-21 (×5): qty 100

## 2020-04-21 NOTE — Progress Notes (Signed)
PROGRESS NOTE  [name] [id number] DOB: 06-22-1931 DOA: 04/19/2020 PCP: [name], MD   LOS: 2 days   Brief Narrative / Interim history: 86 year old female with history of Parkinson's disease, hypothyroidism, mild dementia, hyperlipidemia, admitted to the ICU on 5/11 with septic shock due to UTI.  She initially required pressors.  She was found to have an obstructive 11 x 7 mm stone in the right ureter at the level of the pelvic inlet.  Urology and IR were consulted and she is status post nephrostomy tube placement on the right side.  Microbiology revealed E. coli in the urine which is intermediate to ampicillin but otherwise sensitive.  She has been maintained on ceftriaxone.  Blood cultures have remained negative so far  Subjective / 24h Interval events: She is doing well this morning, denies any chest pain, denies any shortness of breath.  No abdominal pain, no nausea or vomiting.  Husband is at bedside.  Assessment & Plan: Principal Problem Septic shock due to E. coli UTI, obstructive right kidney stone POA-patient was initially admitted to the ICU requiring vasopressors.  She is status post right-sided percutaneous nephrostomy tube.  Clinically she is improving and was transferred to the hospitalist service today. -Keep on IV antibiotics today, blood cultures without growth at 2 days but still pending -Sepsis physiology improving, blood pressure is better -Was placed on stress dose steroids, decrease dose today -Still has some hematuria in the nephrostomy tube  Active Problems Acute kidney injury-in setting of septic shock, creatinine improving.  Avoid nephrotoxins.  Hypokalemia/hypomagnesemia-replete as indicated  Anemia-in the setting of shock, hemoglobin overall stable, no evidence of bleeding  Facial swelling /hand swelling-resolved today, unclear etiology, apparently received Lasix on 5/11.  As her kidney function is improving and blood pressure is better stop  IV fluids today  Mild Transaminitis  -Likely due to shock, follow LFTs, improving today  Elevated Troponin  Known RBBB -Suspect demand ischemia in the setting of shock  Hx Sjogrens  -On plaquenil and prednisone. -hold plaquenil  -stress dose steroids as above   Hx hypothyroidism -continue synthroid    Hx GERD -PPI   Hx HTN, HLD, RBB, dCHF, AS. -continue home ASA, ezetimibe, rosuvastatin  -tele monitoring   Hx depression, visual hallucinations, fibromyalgia, anxiety, parkinsons, RLS -continue home sinemet, quetiapine, sertraline   Aortic stenosis-2D echo done yesterday showed normal EF but severe AS.  Outpatient cardiology follow-up  Scheduled Meds: . aspirin EC  81 mg Oral Daily  . carbidopa-levodopa  2 tablet Oral BID  . Chlorhexidine Gluconate Cloth  6 each Topical Daily  . cholecalciferol  2,000 Units Oral Daily  . docusate sodium  100 mg Oral QHS  . ezetimibe  10 mg Oral QHS  . hydrocortisone sod succinate (SOLU-CORTEF) inj  50 mg Intravenous Q8H  . levothyroxine  50 mcg Oral QAC breakfast  . lubiprostone  24 mcg Oral Daily  . mouth rinse  15 mL Mouth Rinse BID  . multivitamin with minerals  1 tablet Oral Daily  . pantoprazole  40 mg Oral Daily  . pramipexole  1 mg Oral BID  . QUEtiapine  12.5 mg Oral Daily  . QUEtiapine  25 mg Oral QHS  . rosuvastatin  20 mg Oral Daily  . saccharomyces boulardii  250 mg Oral BID  . senna-docusate  1 tablet Oral QHS  . sertraline  75 mg Oral QHS  . sodium chloride flush  10-40 mL Intracatheter Q12H  . sodium chloride flush  5 mL  Intracatheter Q8H   Continuous Infusions: . sodium chloride Stopped (04/21/20 0748)  . sodium chloride Stopped (04/19/20 1535)  . cefTRIAXone (ROCEPHIN)  IV Stopped (04/21/20 0818)   PRN Meds:.acetaminophen **OR** acetaminophen, albuterol, ondansetron **OR** ondansetron (ZOFRAN) IV, polyvinyl alcohol, sodium chloride flush  DVT prophylaxis: SCDs Code Status: Partial code Family  Communication: Husband present at bedside  Status is: Inpatient  Remains inpatient appropriate because:Inpatient level of care appropriate due to severity of illness   Dispo: The patient is from: SNF              Anticipated d/c is to: SNF              Anticipated d/c date is: 2 days              Patient currently is not medically stable to d/c.  Consultants:  Urology IR PCCM  Procedures:  2D echo:  IMPRESSIONS  1. Since the last study on 12/30/2019 aortic stenosis is now severe - peak/mean transaortic gradients 66/41 mmHg and dimensionless index 0.21. Referral for a TAVR procedure should be considered.  2. Left ventricular ejection fraction, by estimation, is 60 to 65%. The left ventricle has normal function. The left ventricle has no regional wall motion abnormalities. There is mild concentric left ventricular hypertrophy. Left ventricular diastolic parameters are consistent with Grade I diastolic dysfunction (impaired relaxation). Elevated left atrial pressure.  3. Right ventricular systolic function is normal. The right ventricular size is normal.  4. The mitral valve is normal in structure. Mild mitral valve regurgitation. No evidence of mitral stenosis.  5. The aortic valve is normal in structure. Aortic valve regurgitation is not visualized. Severe aortic valve stenosis. Aortic valve mean gradient measures 41.0 mmHg.  6. The inferior vena cava is normal in size with greater than 50% respiratory variability, suggesting right atrial pressure of 3 mmHg.   Microbiology  Urine cultures - E coli  Antimicrobials: Ceftriaxone     Objective: Vitals:   04/21/20 0400 04/21/20 0700 04/21/20 0800 04/21/20 0807  BP: 119/63 134/70 (!) 145/63   Pulse: 71 71 70   Resp: (!) 23 17 (!) 22   Temp:   (!) 97.3 F (36.3 C)   TempSrc:   Oral   SpO2: 97% 98% 99% 98%  Weight:      Height:        Intake/Output Summary (Last 24 hours) at 04/21/2020 1005 Last data filed at 04/21/2020  0800 Gross per 24 hour  Intake 2381.03 ml  Output 1175 ml  Net 1206.03 ml   Filed Weights   04/19/20 0157 04/19/20 1730  Weight: 65.3 kg 71.3 kg    Examination:  Constitutional: NAD Eyes: no scleral icterus ENMT: Mucous membranes are moist.  Neck: normal, supple Respiratory: clear to auscultation bilaterally, no wheezing, no crackles. Normal respiratory effort.  Cardiovascular: Regular rate and rhythm, no murmurs / rubs / gallops. No LE edema. Good peripheral pulses Abdomen: non distended, no tenderness. Bowel sounds positive.  Musculoskeletal: no clubbing / cyanosis.  Skin: no rashes Neurologic: CN 2-12 grossly intact. Strength 5/5 in all 4.   Data Reviewed: I have independently reviewed following labs and imaging studies   CBC: Recent Labs  Lab 04/15/20 0000 04/19/20 0200 04/19/20 1442 04/20/20 0020 04/21/20 0325  WBC 17.6 1.0* 24.2* 20.3* 23.1*  NEUTROABS  --  0.7*  --   --   --   HGB 9.6* 9.1* 8.0* 7.8* 7.5*  HCT 29* 29.5* 25.3* 24.9* 24.2*  MCV  --  101.4* 99.6 99.2 100.0  PLT 173 131* 163 136* [id number]*   Basic Metabolic Panel: Recent Labs  Lab 04/15/20 0000 04/19/20 0200 04/19/20 1442 04/20/20 0455 04/21/20 0325  NA 138 138 142 143 143  K 3.8 3.4* 3.7 4.0 3.4*  CL 104 107 114* 114* 115*  CO2 25* 19* 17* 17* 20*  GLUCOSE  --  108* 102* 108* 129*  BUN 26* 29* 26* 28* 35*  CREATININE 1.3* 1.64* 1.63* 1.47* 1.23*  CALCIUM 8.7 7.7* 6.8* 6.9* 7.2*  MG  --   --   --  1.5* 3.0*  PHOS  --   --   --  3.6 3.2   Liver Function Tests: Recent Labs  Lab 04/15/20 0000 04/19/20 0200 04/19/20 1442 04/20/20 0455 04/21/20 0325  AST 19 53* 217* 117* 50*  ALT 3* 20 77* 18 8  ALKPHOS 47 99 109 95 95  BILITOT  --  0.6 0.5 0.5 0.5  PROT  --  5.4* 5.1* 5.1* 5.4*  ALBUMIN 3.5 2.7* 2.3* 2.3* 2.3*   Coagulation Profile: Recent Labs  Lab 04/19/20 0200  INR 1.3*   HbA1C: No results for input(s): HGBA1C in the last 72 hours. CBG: Recent Labs  Lab 04/19/20 0200   GLUCAP 94    Recent Results (from the past 240 hour(s))  Blood Culture (routine x 2)     Status: None (Preliminary result)   Collection Time: 04/19/20  2:05 AM   Specimen: BLOOD  Result Value Ref Range Status   Specimen Description   Final    BLOOD RIGHT ANTECUBITAL Performed at [facility] [address], [place], [place] [address]    Special Requests   Final    BOTTLES DRAWN AEROBIC AND ANAEROBIC Blood Culture adequate volume Performed at [facility] [address] [place], [place] [address]    Culture   Final    NO GROWTH 2 DAYS Performed at [facility] [address], [place], [place] [address]    Report Status PENDING  Incomplete  Blood Culture (routine x 2)     Status: None (Preliminary result)   Collection Time: 04/19/20  2:14 AM   Specimen: BLOOD  Result Value Ref Range Status   Specimen Description   Final    BLOOD LEFT ANTECUBITAL Performed at [facility] [address] [place], [place] [address]    Special Requests   Final    BOTTLES DRAWN AEROBIC AND ANAEROBIC Blood Culture adequate volume Performed at [facility] [address], [place], [place] [address]    Culture   Final    NO GROWTH 2 DAYS Performed at [facility] [address] [place], [place] [address]    Report Status PENDING  Incomplete  Urine culture     Status: None   Collection Time: 04/19/20  4:11 AM   Specimen: In/Out Cath Urine  Result Value Ref Range Status   Specimen Description   Final    IN/OUT CATH URINE Performed at [facility] [address], [place], [place] [address]    Special Requests   Final    NONE Performed at [facility], [place] [address], [place], [place] [address]    Culture   Final    NO GROWTH Performed at [facility], [place] [address], [place], [place] [address]    Report Status 04/20/2020 FINAL  Final  SARS  Coronavirus 2 by RT PCR (  hospital order, performed in [facility] hospital lab) Nasopharyngeal Nasopharyngeal Swab     Status: None   Collection Time: 04/19/20  5:43 AM   Specimen: Nasopharyngeal Swab  Result Value Ref Range Status   SARS Coronavirus 2 NEGATIVE NEGATIVE Final    Comment: (NOTE) SARS-CoV-2 target nucleic acids are NOT DETECTED. The SARS-CoV-2 RNA is generally detectable in upper and lower respiratory specimens during the acute phase of infection. The lowest concentration of SARS-CoV-2 viral copies this assay can detect is 250 copies / mL. A negative result does not preclude SARS-CoV-2 infection and should not be used as the sole basis for treatment or other patient management decisions.  A negative result may occur with improper specimen collection / handling, submission of specimen other than nasopharyngeal swab, presence of viral mutation(s) within the areas targeted by this assay, and inadequate number of viral copies (<250 copies / mL). A negative result must be combined with clinical observations, patient history, and epidemiological information. Fact Sheet for Patients:   StrictlyIdeas.no Fact Sheet for Healthcare Providers: BankingDealers.co.za This test is not yet approved or cleared  by the [place] FDA and has been authorized for detection and/or diagnosis of SARS-CoV-2 by FDA under an Emergency Use Authorization (EUA).  This EUA will remain in effect (meaning this test can be used) for the duration of the COVID-19 declaration under Section 564(b)(1) of the Act, 21 U.S.C. section 360bbb-3(b)(1), unless the authorization is terminated or revoked sooner. Performed at [facility], [place] [address], [place], [place] [address]   Urine culture     Status: Abnormal   Collection Time: 04/19/20  9:49 AM   Specimen: Kidney; Urine  Result Value Ref Range Status   Specimen Description   Final     KIDNEY RIGHT Performed at [facility] [address] [place], [place] [address]    Special Requests   Final    Normal Performed at [facility], [place] [address] [place], [place] [address]    Culture >=100,000 COLONIES/mL ESCHERICHIA COLI (A)  Final   Report Status 04/21/2020 FINAL  Final   Organism ID, Bacteria ESCHERICHIA COLI (A)  Final      Susceptibility   Escherichia coli - MIC*    AMPICILLIN 16 INTERMEDIATE Intermediate     CEFAZOLIN <=4 SENSITIVE Sensitive     CEFEPIME <=1 SENSITIVE Sensitive     CEFTAZIDIME <=1 SENSITIVE Sensitive     CEFTRIAXONE 2 SENSITIVE Sensitive     CIPROFLOXACIN 1 SENSITIVE Sensitive     GENTAMICIN <=1 SENSITIVE Sensitive     IMIPENEM <=0.25 SENSITIVE Sensitive     TRIMETH/SULFA <=20 SENSITIVE Sensitive     AMPICILLIN/SULBACTAM <=2 SENSITIVE Sensitive     * >=100,000 COLONIES/mL ESCHERICHIA COLI  MRSA PCR Screening     Status: None   Collection Time: 04/19/20  5:16 PM   Specimen: Nasal Mucosa; Nasopharyngeal  Result Value Ref Range Status   MRSA by PCR NEGATIVE NEGATIVE Final    Comment:        The GeneXpert MRSA Assay (FDA approved for NASAL specimens only), is one component of a comprehensive MRSA colonization surveillance program. It is not intended to diagnose MRSA infection nor to guide or monitor treatment for MRSA infections. Performed at [facility], [place] [address] [place], [place] [address]      Radiology Studies: No results found.  Marzetta Board, MD, PhD [facility]  Between 7 am - 7 pm I am available, please  contact me via Amion or Securechat  Between 7 pm - 7 am I am not available, please contact night coverage MD/APP via Amion

## 2020-04-21 NOTE — TOC Initial Note (Signed)
Transition of Care [facility]) - Initial/Assessment Note    Patient Details  Name: [name] MRN: [id number] Date of Birth: 1931/04/18  Transition of [facility] CM/SW Contact:    Leeroy Cha, RN Phone Number: 04/21/2020, 9:42 AM  Clinical Narrative:                 Pt with G_R in urine, [facility],Iv rocephin, bun 35 creat. 1.23 wbc 23.1 hgb 7.5, frail but is alert and reactive.   Expected Discharge Plan: Skilled Nursing Facility(from) Barriers to Discharge: Continued Medical Work up   Patient Goals and CMS Choice Patient states their goals for this hospitalization and ongoing recovery are:: [facility] CMS Medicare.gov Compare Post Acute Care list provided to:: Patient    Expected Discharge Plan and Services Expected Discharge Plan: Skilled Nursing Facility(from)       Living arrangements for the past 2 months: [place]                                      Prior Living Arrangements/Services Living arrangements for the past 2 months: [place] Lives with:: Facility Resident Patient language and need for interpreter reviewed:: No Do you feel safe going back to the place where you live?: Yes      Need for Family Participation in Patient Care: Yes (Comment) Care giver support system in place?: Yes (comment)      Activities of Daily Living Home Assistive Devices/Equipment: Blood pressure cuff, Eyeglasses, Grab bars around toilet, Grab bars in shower, Hand-held shower hose, Hospital bed, [employer], Wheelchair, Hearing aid, Scales, Other (Comment)(bilateral hearing aids, right urostomy tube) ADL Screening (condition at time of admission) Patient's cognitive ability adequate to safely complete daily activities?: No(patient very weak and sleepy) Is the patient deaf or have difficulty hearing?: Yes(wears bilateral hearing aids) Does the patient have difficulty seeing, even when wearing glasses/contacts?:  No Does the patient have difficulty concentrating, remembering, or making decisions?: Yes Patient able to express need for assistance with ADLs?: Yes Does the patient have difficulty dressing or bathing?: Yes Independently performs ADLs?: No Communication: Independent Dressing (OT): Dependent Is this a change from baseline?: Pre-admission baseline Grooming: Dependent Is this a change from baseline?: Pre-admission baseline Feeding: Dependent Is this a change from baseline?: Pre-admission baseline Bathing: Dependent Is this a change from baseline?: Pre-admission baseline Toileting: Dependent Is this a change from baseline?: Pre-admission baseline In/Out Bed: Dependent Is this a change from baseline?: Pre-admission baseline Walks in Home: Dependent Is this a change from baseline?: Pre-admission baseline Does the patient have difficulty walking or climbing stairs?: Yes(secondary to weakness-patient wheelchair bound) Weakness of Legs: Both Weakness of Arms/Hands: Both  Permission Sought/Granted                  Emotional Assessment Appearance:: Appears stated age     Orientation: : Oriented to Self, Oriented to Place, Oriented to  Time, Oriented to Situation Alcohol / Substance Use: Not Applicable Psych Involvement: No (comment)  Admission diagnosis:  Severe sepsis ([place]) [A41.9, R65.20] Obstruction of right ureteropelvic junction due to stone [N20.1] Fever, unspecified fever cause [R50.9] Urinary tract infection without hematuria, site unspecified [N39.0] Sepsis, due to unspecified organism, unspecified whether acute organ dysfunction present [facility]) [A41.9] Patient Active Problem List   Diagnosis Date Noted  . Septic shock ([place]) 04/19/2020  . Obstruction of right ureteropelvic junction  due to stone   . Acute kidney injury ([employer]) 04/18/2020  . Fever 04/15/2020  . Erosive (osteo)arthritis 03/09/2020  . Right hand pain 02/01/2020  . Dysuria 10/16/2019  . UTI (urinary tract  infection) 10/12/2019  . Fall 09/21/2019  . Confusion 07/24/2019  . Depression, psychotic ([place]) 05/08/2019  . Nose dryness 04/23/2019  . Actinic cheilitis 04/23/2019  . Skin lesion 03/05/2019  . DOE (dyspnea on exertion) 12/18/2018  . Ingrowing nail, right great toe 12/18/2018  . Iron deficiency anemia 10/09/2018  . Renal lithiasis 09/19/2018  . Urinary frequency 09/18/2018  . Corns and callus 09/18/2018  . Hallucinations, visual 07/21/2018  . History of recent fall 07/21/2018  . Acute right-sided low back pain 05/22/2018  . Unstable gait 04/21/2018  . Memory loss 04/21/2018  . Abnormal nuclear stress test   . Hypothyroidism 02/04/2018  . Chronic back pain 02/04/2018  . Osteoporosis without current pathological fracture 02/04/2018  . Recurrent UTI 02/04/2018  . Sjogren's syndrome ([place]) 02/04/2018  . Chronic constipation 02/04/2018  . RBBB   . Aortic stenosis   . Excessive cerumen in both ear canals 09/24/2016  . Sensorineural hearing loss (SNHL), bilateral 09/24/2016  . Temporomandibular jaw dysfunction 09/24/2016  . Acute lower UTI 03/24/2015  . Microhematuria 03/24/2015  . Follow up 02/15/2015  . Other dysphagia 09/03/2014  . Unspecified hereditary and idiopathic peripheral neuropathy 09/03/2014  . Heart murmur 04/19/2014  . GERD (gastroesophageal reflux disease)   . Hypercholesterolemia   . PVD (peripheral vascular disease) ([place])   . Barrett esophagus   . Chronic diastolic CHF (congestive heart failure) ([place])   . Parkinson's disease ([place]) 08/26/2013  . Swelling of both ankles 11/20/2012  . Fracture of multiple pubic rami ([place]) 07/17/2012  . Hematoma of hip 07/17/2012  . Leukocytosis 07/17/2012  . Hypertension   . Atherosclerosis of native arteries of the extremities with intermittent claudication 05/22/2012   PCP:  [name], MD Pharmacy:   [place], [place] - [place] [address] [place] [address] Phone:  [phone] Fax: [place], [place] [address] [place] [address] Phone: [phone] Fax: [phone]     Social Determinants of Health (SDOH) Interventions    Readmission Risk Interventions No flowsheet data found.

## 2020-04-21 NOTE — Progress Notes (Signed)
Notified Elink of k of 3.4. Patient unable to swallow large pills without crushing and potassium cannot be crushed. Awaiting orders

## 2020-04-21 NOTE — Progress Notes (Signed)
[facility] Progress Note Patient Name: [name] DOB: 1931/10/31 MRN: [id number]   Date of Service  04/21/2020  HPI/Events of Note  Hypokalemia - K+ = 3.4 and Creatinine = 1.23.   eICU Interventions  Will replace K+.     Intervention Category Major Interventions: Electrolyte abnormality - evaluation and management  Paul Trettin [place] 04/21/2020, 5:28 AM

## 2020-04-21 NOTE — Progress Notes (Signed)
Referring Physician(s): Etta Quill  Supervising Physician: Daryll Brod  Patient Status:  [facility] - In-pt  Chief Complaint: None  Subjective:  History of urinary sepsis secondary to right hydronephrosis s/p percutaneous right nephrostomy tube placement in IR 04/19/2020 by Dr. Pascal Lux. Patient awake and alert sitting in bed with no complaints at this time. States she feels "better than yesterday". Accompanied by husband at bedside. Right nephrostomy tube site c/d/i.   Allergies: Adrenalone, Lactose, Morphine, Sulfa antibiotics, Codeine, Lactose intolerance (gi), Latex, Lyrica [pregabalin], Other, Plaquenil [hydroxychloroquine sulfate], Reglan [metoclopramide], Requip [ropinirole hcl], Septra [sulfamethoxazole-trimethoprim], and Shellfish allergy  Medications: Prior to Admission medications   Medication Sig Start Date End Date Taking? Authorizing Provider  acetaminophen (TYLENOL) 500 MG tablet Take 500 mg by mouth daily as needed for mild pain.    Yes [provider]  acetaminophen (TYLENOL) 500 MG tablet Take 500 mg by mouth 2 (two) times daily.   Yes [provider]  albuterol (PROVENTIL HFA;VENTOLIN HFA) 108 (90 Base) MCG/ACT inhaler Inhale 2 puffs into the lungs every 6 (six) hours as needed for wheezing or shortness of breath. 01/15/19  Yes Collene Gobble, MD  aspirin EC 81 MG tablet Take 81 mg by mouth daily.    Yes [provider]  Calcium Carb-Cholecalciferol (CALCIUM 600/VITAMIN D3) 600-800 MG-UNIT TABS Take 1 tablet by mouth daily.   Yes [provider]  carbidopa-levodopa (SINEMET IR) 25-100 MG tablet Take 2 tablets by mouth in the morning and at bedtime.    Yes [provider]  Cholecalciferol (VITAMIN D) 2000 units CAPS Take 2,000 Units by mouth daily.   Yes [provider]  clotrimazole-betamethasone (LOTRISONE) cream Apply 1 application topically 2 (two) times daily. 04/11/20  Yes Trula Slade, DPM  docusate  sodium (COLACE) 100 MG capsule Take 100 mg by mouth at bedtime.   Yes [provider]  ezetimibe (ZETIA) 10 MG tablet TAKE 1 TABLET ONCE DAILY. 06/01/19  Yes [name], MD  Ferrous Sulfate (SLOW FE) 142 (45 Fe) MG TBCR Take 1 tablet by mouth. Daily   Yes [provider]  hydrocortisone 2.5 % cream Apply 1 application topically 2 (two) times daily.   Yes [provider]  hydroxychloroquine (PLAQUENIL) 200 MG tablet Take 200 mg by mouth daily.   Yes [provider]  lansoprazole (PREVACID SOLUTAB) 30 MG disintegrating tablet Take 30 mg by mouth daily at 12 noon.   Yes [provider]  levothyroxine (SYNTHROID, LEVOTHROID) 50 MCG tablet Take 50 mcg by mouth daily before breakfast.    Yes [provider]  lubiprostone (AMITIZA) 24 MCG capsule Take 24 mcg by mouth daily.    Yes [provider]  Multiple Vitamin (MULTIVITAMIN WITH MINERALS) TABS tablet Take 1 tablet by mouth daily.   Yes [provider]  Multiple Vitamins-Minerals (PRESERVISION AREDS 2) CHEW Chew 1 tablet by mouth daily.    Yes [provider]  nitrofurantoin, macrocrystal-monohydrate, (MACROBID) 100 MG capsule Take 100 mg by mouth 2 (two) times daily.   Yes [provider]  nystatin (MYCOSTATIN/NYSTOP) powder Apply topically 2 (two) times daily as needed. Patient taking differently: Apply topically 2 (two) times daily as needed (dermatitis).  12/18/18  Yes [name], Man X, NP  Polyethyl Glycol-Propyl Glycol (SYSTANE) 0.4-0.3 % SOLN Apply 1 drop to eye 2 (two) times daily as needed (dry eyes).    Yes [provider]  pramipexole (MIRAPEX) 1 MG tablet Take 1 mg by mouth 2 (  two) times daily.   Yes [provider]  QUEtiapine (SEROQUEL) 25 MG tablet Take 12.5-25 mg by mouth See admin instructions. Take 12.5 mg in Am and 25 mg in PM   Yes [provider]  rosuvastatin (CRESTOR) 20 MG tablet Take 1 tablet (20 mg total) by mouth  daily. 05/01/19 04/30/20 Yes Turner, Eber Hong, MD  saccharomyces boulardii (FLORASTOR) 250 MG capsule Take 250 mg by mouth 2 (two) times daily.   Yes [provider]  senna-docusate (SENOKOT-S) 8.6-50 MG tablet Take 1 tablet by mouth at bedtime.    Yes [provider]  sertraline (ZOLOFT) 50 MG tablet Take 75 mg by mouth daily. Take 1.5 tablets to = 75 mg   Yes [provider]  Skin Protectants, Misc. (EUCERIN) cream Apply 1 application topically daily.    Yes [provider]  Spacer/Aero-Holding Chambers (AEROCHAMBER MV) inhaler by Other route. Use as instructed as needed   Yes [provider]     Vital Signs: BP (!) 145/63   Pulse 70   Temp (!) 97.5 F (36.4 C)   Resp (!) 22   Ht 5\' 2"  (1.575 m)   Wt 157 lb 3 oz (71.3 kg)   SpO2 98%   BMI 28.75 kg/m   Physical Exam Vitals and nursing note reviewed.  Constitutional:      General: She is not in acute distress.    Appearance: Normal appearance.  Pulmonary:     Effort: Pulmonary effort is normal. No respiratory distress.  Genitourinary:    Comments: Right nephrostomy tube site without tenderness, erythema, drainage, or active bleeding; approximately 75 cc blood tinged urine in gravity bag. Skin:    General: Skin is warm and dry.  Neurological:     Mental Status: She is alert and oriented to person, place, and time.     Imaging: DG Chest 1 View  Result Date: 04/19/2020 CLINICAL DATA:  Fever, tachypnea EXAM: CHEST  1 VIEW COMPARISON:  04/19/2020 FINDINGS: Heart is normal size. Mediastinal contours within normal limits. Spinal stimulator wires remain in stable position. No confluent airspace opacities or edema. Possible trace bilateral effusions. No acute bony abnormality. IMPRESSION: Possible trace bilateral effusions. Electronically Signed   By: Rolm Baptise M.D.   On: 04/19/2020 17:42   DG Chest Port 1 View  Result Date: 04/19/2020 CLINICAL DATA:  Shortness of breath and fever EXAM:  PORTABLE CHEST 1 VIEW COMPARISON:  Chest radiograph 01/15/2019 FINDINGS: The heart size and mediastinal contours are within normal limits. Both lungs are clear. The visualized skeletal structures are unremarkable. Spinal stimulator leads overlie the midthoracic spine. IMPRESSION: No active disease. Electronically Signed   By: Ulyses Jarred M.D.   On: 04/19/2020 02:35   ECHOCARDIOGRAM COMPLETE  Result Date: 04/20/2020    ECHOCARDIOGRAM REPORT   Patient Name:   [name] Date of Exam: 04/20/2020 Medical Rec #:  [id number]         Height:       62.0 in Accession #:    [id number]        Weight:       157.2 lb Date of Birth:  1931-07-01        BSA:          1.726 m Patient Age:    86 years          BP:           99/58 mmHg Patient Gender: F  HR:           85 bpm. Exam Location:  Inpatient Procedure: 2D Echo, Color Doppler and Cardiac Doppler Indications:    Acute Respiratory Insufficiency R06.89  History:        Patient has prior history of Echocardiogram examinations, most                 recent 12/30/2019. CHF; Risk Factors:Hypertension and                 Dyslipidemia.  Sonographer:    Raquel Sarna Senior RDCS Referring Phys: [place]  1. Since the last study on 12/30/2019 aortic stenosis is now severe - peak/mean transaortic gradients 66/41 mmHg and dimensionless index 0.21. Referral for a TAVR procedure should be considered.  2. Left ventricular ejection fraction, by estimation, is 60 to 65%. The left ventricle has normal function. The left ventricle has no regional wall motion abnormalities. There is mild concentric left ventricular hypertrophy. Left ventricular diastolic parameters are consistent with Grade I diastolic dysfunction (impaired relaxation). Elevated left atrial pressure.  3. Right ventricular systolic function is normal. The right ventricular size is normal.  4. The mitral valve is normal in structure. Mild mitral valve regurgitation. No evidence of mitral  stenosis.  5. The aortic valve is normal in structure. Aortic valve regurgitation is not visualized. Severe aortic valve stenosis. Aortic valve mean gradient measures 41.0 mmHg.  6. The inferior vena cava is normal in size with greater than 50% respiratory variability, suggesting right atrial pressure of 3 mmHg. FINDINGS  Left Ventricle: Left ventricular ejection fraction, by estimation, is 60 to 65%. The left ventricle has normal function. The left ventricle has no regional wall motion abnormalities. The left ventricular internal cavity size was normal in size. There is  mild concentric left ventricular hypertrophy. Left ventricular diastolic parameters are consistent with Grade I diastolic dysfunction (impaired relaxation). Elevated left atrial pressure. Right Ventricle: The right ventricular size is normal. No increase in right ventricular wall thickness. Right ventricular systolic function is normal. Left Atrium: Left atrial size was normal in size. Right Atrium: Right atrial size was normal in size. Pericardium: There is no evidence of pericardial effusion. Mitral Valve: The mitral valve is normal in structure. There is mild thickening of the mitral valve leaflet(s). There is mild calcification of the mitral valve leaflet(s). Normal mobility of the mitral valve leaflets. Moderate mitral annular calcification. Mild mitral valve regurgitation. No evidence of mitral valve stenosis. Tricuspid Valve: The tricuspid valve is normal in structure. Tricuspid valve regurgitation is mild . No evidence of tricuspid stenosis. Aortic Valve: The aortic valve is normal in structure.. There is severe thickening and severe calcifcation of the aortic valve. Aortic valve regurgitation is not visualized. Severe aortic stenosis is present. There is severe thickening of the aortic valve. There is severe calcifcation of the aortic valve. Aortic valve mean gradient measures 41.0 mmHg. Aortic valve peak gradient measures 65.3 mmHg.  Aortic valve area, by VTI measures 0.85 cm. Pulmonic Valve: The pulmonic valve was normal in structure. Pulmonic valve regurgitation is not visualized. No evidence of pulmonic stenosis. Aorta: The aortic root is normal in size and structure. Venous: The inferior vena cava is normal in size with greater than 50% respiratory variability, suggesting right atrial pressure of 3 mmHg. IAS/Shunts: No atrial level shunt detected by color flow Doppler.  LEFT VENTRICLE PLAX 2D LVIDd:         3.00 cm  Diastology LVIDs:  2.50 cm  LV e' lateral:   6.64 cm/s LV PW:         1.10 cm  LV E/e' lateral: 20.2 LV IVS:        1.10 cm  LV e' medial:    4.90 cm/s LVOT diam:     2.10 cm  LV E/e' medial:  27.3 LV SV:         88 LV SV Index:   51 LVOT Area:     3.46 cm  RIGHT VENTRICLE RV S prime:     6.96 cm/s TAPSE (M-mode): 2.2 cm LEFT ATRIUM             Index LA diam:        3.80 cm 2.20 cm/m LA Vol (A2C):   61.6 ml 35.70 ml/m LA Vol (A4C):   76.9 ml 44.56 ml/m LA Biplane Vol: 75.2 ml 43.58 ml/m  AORTIC VALVE AV Area (Vmax):    0.72 cm AV Area (Vmean):   0.73 cm AV Area (VTI):     0.85 cm AV Vmax:           404.00 cm/s AV Vmean:          299.000 cm/s AV VTI:            1.030 m AV Peak Grad:      65.3 mmHg AV Mean Grad:      41.0 mmHg LVOT Vmax:         84.20 cm/s LVOT Vmean:        63.400 cm/s LVOT VTI:          0.254 m LVOT/AV VTI ratio: 0.25  AORTA Ao Root diam: 3.40 cm Ao Asc diam:  3.10 cm MITRAL VALVE MV Area (PHT): 3.72 cm     SHUNTS MV Decel Time: 204 msec     Systemic VTI:  0.25 m MV E velocity: 134.00 cm/s  Systemic Diam: 2.10 cm MV A velocity: 131.00 cm/s MV E/A ratio:  1.02 Ena Dawley MD Electronically signed by Ena Dawley MD Signature Date/Time: 04/20/2020/11:59:59 AM    Final    CT RENAL STONE STUDY  Result Date: 04/19/2020 CLINICAL DATA:  Severe sepsis.  Rule out hydronephrosis EXAM: CT ABDOMEN AND PELVIS WITHOUT CONTRAST TECHNIQUE: Multidetector CT imaging of the abdomen and pelvis was  performed following the standard protocol without IV contrast. COMPARISON:  01/02/2019 FINDINGS: Lower chest: Trace pleural effusions and lower lobe atelectasis. Extensive coronary atherosclerosis. Small hiatal hernia. Hepatobiliary: No focal liver abnormality.No evidence of biliary obstruction or stone. Pancreas: Mild generalized atrophy. Spleen: Unremarkable. Adrenals/Urinary Tract: Negative adrenals. Right hydroureteronephrosis due to a obstructing 11 x 7 mm stone at the pelvic inlet. Irregular 7 mm interpolar calculus in the left kidney. Two small lower pole calculi on the right. Unremarkable bladder. Stomach/Bowel:  No obstruction. Moderate rectal stool. Vascular/Lymphatic: Atherosclerosis no acute vascular abnormality. No mass or adenopathy. Reproductive:Hysterectomy Other: No ascites or pneumoperitoneum. Musculoskeletal: Lumbar fusion from L2-S1. Severe adjacent segment degeneration at T12-L1 and especially L1-2 where there is retrolisthesis and spinal/foraminal impingement. A spinal stimulator is present. IMPRESSION: 1. Obstructing 11 x 7 mm stone in the right ureter at the level of the pelvic inlet. 2. Bilateral nephrolithiasis. 3. Incidental findings are noted above. Electronically Signed   By: Monte Fantasia M.D.   On: 04/19/2020 06:29   IR NEPHROSTOMY PLACEMENT RIGHT  Result Date: 04/19/2020 INDICATION: Urinary sepsis secondary to right-sided hydronephrosis. EXAM: 1. ULTRASOUND GUIDANCE FOR PUNCTURE OF THE RIGHT RENAL COLLECTING SYSTEM  2. RIGHT PERCUTANEOUS NEPHROSTOMY TUBE PLACEMENT. COMPARISON:  CT abdomen pelvis-earlier same day; 01/02/2019 MEDICATIONS: Rocephin 1 gm IV; The antibiotic was administered in an appropriate time frame prior to skin puncture. ANESTHESIA/SEDATION: Moderate (conscious) sedation was employed during this procedure. A total of Versed 0.5 mg was administered intravenously. Moderate Sedation Time: 14 minutes. The patient's level of consciousness and vital signs were  monitored continuously by radiology nursing throughout the procedure under my direct supervision. CONTRAST:  10 mL Isovue 300 - administered into the renal collecting system FLUOROSCOPY TIME:  1 minute, 36 seconds (79 mGy) COMPLICATIONS: None immediate. PROCEDURE: The procedure, risks, benefits, and alternatives were explained to the patient. Questions regarding the procedure were encouraged and answered. The patient understands and consents to the procedure. A timeout was performed prior to the initiation of the procedure. The right flank region was prepped and draped in the usual sterile fashion and a sterile drape was applied covering the operative field. A sterile gown and sterile gloves were used for the procedure. Local anesthesia was provided with 1% Lidocaine with epinephrine. Ultrasound was used to localize the right kidney. Under direct ultrasound guidance, a 20 gauge needle was advanced into the renal collecting system. An ultrasound image documentation was performed. Access within the collecting system was confirmed with the efflux of urine followed by limited contrast injection. Over a Nitrex wire, the tract was dilated with an Accustick stent. Next, under intermittent fluoroscopic guidance and over a short Amplatz wire, the track was dilated ultimately allowing placement of a 10-French percutaneous nephrostomy catheter which was advanced to the level of the renal pelvis where the coil was formed and locked. Contrast was injected and several spot fluoroscopic images were obtained in various obliquities. The catheter was secured at the skin with a Prolene retention suture and stat lock device and connected to a gravity bag was placed. Dressings were applied. The patient tolerated procedure well without immediate postprocedural complication. FINDINGS: Ultrasound scanning demonstrates a moderate to severely dilated right collecting system. Under a combination of ultrasound and fluoroscopic guidance, a  posterior inferior calix was targeted allowing placement of a 10-French percutaneous nephrostomy catheter with end coiled and locked within the renal pelvis. Contrast injection confirmed appropriate positioning. Limited contrast injection suggest questioned right proximal ureteral stone likely represents a calcified phlebolith as was suggested on more remote abdominal CT performed 01/02/2019. IMPRESSION: 1. Successful ultrasound and fluoroscopic guided placement of a right sided 10 [place] PCN. 2. Limited contrast injection confirms questioned proximal right ureteral/UPJ stone likely represents a calcified phleboliths as was suggested on more remote abdominal CT 01/02/2019. PLAN: As the etiology of the patient's right-sided urinary obstruction remains indeterminate, the patient may return for right-sided antegrade nephrostogram with potential right-sided ureteral stent placement in 2-3 weeks as indicated. Electronically Signed   By: Sandi Mariscal M.D.   On: 04/19/2020 10:47    Labs:  CBC: Recent Labs    04/19/20 0200 04/19/20 1442 04/20/20 0020 04/21/20 0325  WBC 1.0* 24.2* 20.3* 23.1*  HGB 9.1* 8.0* 7.8* 7.5*  HCT 29.5* 25.3* 24.9* 24.2*  PLT 131* 163 136* 136*    COAGS: Recent Labs    04/19/20 0200  INR 1.3*  APTT 29    BMP: Recent Labs    04/19/20 0200 04/19/20 1442 04/20/20 0455 04/21/20 0325  NA 138 142 143 143  K 3.4* 3.7 4.0 3.4*  CL 107 114* 114* 115*  CO2 19* 17* 17* 20*  GLUCOSE 108* 102* 108* 129*  BUN 29* 26*  28* 35*  CALCIUM 7.7* 6.8* 6.9* 7.2*  CREATININE 1.64* 1.63* 1.47* 1.23*  GFRNONAA 28* 28* 32* 40*  GFRAA 32* 33* 37* 46*    LIVER FUNCTION TESTS: Recent Labs    04/19/20 0200 04/19/20 1442 04/20/20 0455 04/21/20 0325  BILITOT 0.6 0.5 0.5 0.5  AST 53* 217* 117* 50*  ALT 20 77* 18 8  ALKPHOS 99 109 95 95  PROT 5.4* 5.1* 5.1* 5.4*  ALBUMIN 2.7* 2.3* 2.3* 2.3*    Assessment and Plan:  History of urinary sepsis secondary to right hydronephrosis  s/p percutaneous right nephrostomy tube placement in IR 04/19/2020 by Dr. Pascal Lux. Right nephrostomy tube site stable with approximately 75 cc blood tinged urine in gravity bag (additional 425 cc output from drain in past 24 hours per chart). Continue current drain management- tube to be managed by urology on outpatient basis. Further plans per [facility]/urology- appreciate and agree with management. IR to follow.   Electronically Signed: Earley Abide, PA-C 04/21/2020, 1:14 PM   I spent a total of 25 Minutes at the the patient's bedside AND on the patient's hospital floor or unit, greater than 50% of which was counseling/coordinating care for right hydronephrosis s/p right nephrostomy tube placement.

## 2020-04-21 NOTE — Progress Notes (Signed)
Urology Progress Note   Subjective: Stepdown status.  Still off vasopressors; BP improving.   Right PCN draining thin cherry colored fluid, improving. Afebrile.  WBC 23.1 from 20.3 yesterday Cr improved to 1.2 UClx with E Coli. BClx with NGx2@48hr   Ate small breakfast.  Husband at bedside.   Objective: Vital signs in last 24 hours: Temp:  [97.2 F (36.2 C)-98 F (36.7 C)] 97.2 F (36.2 C) (05/13 1600) Pulse Rate:  [70-88] 78 (05/13 1300) Resp:  [13-37] 13 (05/13 1300) BP: (94-145)/(47-110) 114/72 (05/13 1300) SpO2:  [95 %-99 %] 97 % (05/13 1300)  Intake/Output from previous day: 05/12 0701 - 05/13 0700 In: 2506.2 [P.O.:360; I.V.:1269; IV Piggyback:367.2] Out: 1175 [Urine:1175] Intake/Output this shift: Total I/O In: 1010 [I.V.:549.8; IV Piggyback:460.2] Out: -   Physical Exam:  General: Alert and oriented, improving CV: Regular rate Abdomen: Soft, nontender  GU: Right PCN draining thin cherry colored urine. No CVA or RLQ pain to palpation.   Lab Results: Recent Labs    04/19/20 1442 04/20/20 0020 04/21/20 0325  HGB 8.0* 7.8* 7.5*  HCT 25.3* 24.9* 24.2*   Recent Labs    04/20/20 0455 04/21/20 0325  NA 143 143  K 4.0 3.4*  CL 114* 115*  CO2 17* 20*  GLUCOSE 108* 129*  BUN 28* 35*  CREATININE 1.47* 1.23*  CALCIUM 6.9* 7.2*    Studies/Results: DG Chest 1 View  Result Date: 04/19/2020 CLINICAL DATA:  Fever, tachypnea EXAM: CHEST  1 VIEW COMPARISON:  04/19/2020 FINDINGS: Heart is normal size. Mediastinal contours within normal limits. Spinal stimulator wires remain in stable position. No confluent airspace opacities or edema. Possible trace bilateral effusions. No acute bony abnormality. IMPRESSION: Possible trace bilateral effusions. Electronically Signed   By: Rolm Baptise M.D.   On: 04/19/2020 17:42   ECHOCARDIOGRAM COMPLETE  Result Date: 04/20/2020    ECHOCARDIOGRAM REPORT   Patient Name:   [name] Date of Exam: 04/20/2020 Medical Rec #:   [id number]         Height:       62.0 in Accession #:    [id number]        Weight:       157.2 lb Date of Birth:  09/16/1931        BSA:          1.726 m Patient Age:    86 years          BP:           99/58 mmHg Patient Gender: F                 HR:           85 bpm. Exam Location:  Inpatient Procedure: 2D Echo, Color Doppler and Cardiac Doppler Indications:    Acute Respiratory Insufficiency R06.89  History:        Patient has prior history of Echocardiogram examinations, most                 recent 12/30/2019. CHF; Risk Factors:Hypertension and                 Dyslipidemia.  Sonographer:    Raquel Sarna Senior RDCS Referring Phys: [facility]  1. Since the last study on 12/30/2019 aortic stenosis is now severe - peak/mean transaortic gradients 66/41 mmHg and dimensionless index 0.21. Referral for a TAVR procedure should be considered.  2. Left ventricular ejection fraction, by estimation, is 60 to 65%. The left ventricle has  normal function. The left ventricle has no regional wall motion abnormalities. There is mild concentric left ventricular hypertrophy. Left ventricular diastolic parameters are consistent with Grade I diastolic dysfunction (impaired relaxation). Elevated left atrial pressure.  3. Right ventricular systolic function is normal. The right ventricular size is normal.  4. The mitral valve is normal in structure. Mild mitral valve regurgitation. No evidence of mitral stenosis.  5. The aortic valve is normal in structure. Aortic valve regurgitation is not visualized. Severe aortic valve stenosis. Aortic valve mean gradient measures 41.0 mmHg.  6. The inferior vena cava is normal in size with greater than 50% respiratory variability, suggesting right atrial pressure of 3 mmHg. FINDINGS  Left Ventricle: Left ventricular ejection fraction, by estimation, is 60 to 65%. The left ventricle has normal function. The left ventricle has no regional wall motion abnormalities. The left ventricular  internal cavity size was normal in size. There is  mild concentric left ventricular hypertrophy. Left ventricular diastolic parameters are consistent with Grade I diastolic dysfunction (impaired relaxation). Elevated left atrial pressure. Right Ventricle: The right ventricular size is normal. No increase in right ventricular wall thickness. Right ventricular systolic function is normal. Left Atrium: Left atrial size was normal in size. Right Atrium: Right atrial size was normal in size. Pericardium: There is no evidence of pericardial effusion. Mitral Valve: The mitral valve is normal in structure. There is mild thickening of the mitral valve leaflet(s). There is mild calcification of the mitral valve leaflet(s). Normal mobility of the mitral valve leaflets. Moderate mitral annular calcification. Mild mitral valve regurgitation. No evidence of mitral valve stenosis. Tricuspid Valve: The tricuspid valve is normal in structure. Tricuspid valve regurgitation is mild . No evidence of tricuspid stenosis. Aortic Valve: The aortic valve is normal in structure.. There is severe thickening and severe calcifcation of the aortic valve. Aortic valve regurgitation is not visualized. Severe aortic stenosis is present. There is severe thickening of the aortic valve. There is severe calcifcation of the aortic valve. Aortic valve mean gradient measures 41.0 mmHg. Aortic valve peak gradient measures 65.3 mmHg. Aortic valve area, by VTI measures 0.85 cm. Pulmonic Valve: The pulmonic valve was normal in structure. Pulmonic valve regurgitation is not visualized. No evidence of pulmonic stenosis. Aorta: The aortic root is normal in size and structure. Venous: The inferior vena cava is normal in size with greater than 50% respiratory variability, suggesting right atrial pressure of 3 mmHg. IAS/Shunts: No atrial level shunt detected by color flow Doppler.  LEFT VENTRICLE PLAX 2D LVIDd:         3.00 cm  Diastology LVIDs:         2.50 cm   LV e' lateral:   6.64 cm/s LV PW:         1.10 cm  LV E/e' lateral: 20.2 LV IVS:        1.10 cm  LV e' medial:    4.90 cm/s LVOT diam:     2.10 cm  LV E/e' medial:  27.3 LV SV:         88 LV SV Index:   51 LVOT Area:     3.46 cm  RIGHT VENTRICLE RV S prime:     6.96 cm/s TAPSE (M-mode): 2.2 cm LEFT ATRIUM             Index LA diam:        3.80 cm 2.20 cm/m LA Vol (A2C):   61.6 ml 35.70 ml/m LA Vol (A4C):   76.9  ml 44.56 ml/m LA Biplane Vol: 75.2 ml 43.58 ml/m  AORTIC VALVE AV Area (Vmax):    0.72 cm AV Area (Vmean):   0.73 cm AV Area (VTI):     0.85 cm AV Vmax:           404.00 cm/s AV Vmean:          299.000 cm/s AV VTI:            1.030 m AV Peak Grad:      65.3 mmHg AV Mean Grad:      41.0 mmHg LVOT Vmax:         84.20 cm/s LVOT Vmean:        63.400 cm/s LVOT VTI:          0.254 m LVOT/AV VTI ratio: 0.25  AORTA Ao Root diam: 3.40 cm Ao Asc diam:  3.10 cm MITRAL VALVE MV Area (PHT): 3.72 cm     SHUNTS MV Decel Time: 204 msec     Systemic VTI:  0.25 m MV E velocity: 134.00 cm/s  Systemic Diam: 2.10 cm MV A velocity: 131.00 cm/s MV E/A ratio:  1.02 Ena Dawley MD Electronically signed by Ena Dawley MD Signature Date/Time: 04/20/2020/11:59:59 AM    Final     Assessment/Plan:  86 y.o. female s/p right PCN placement for right ureteral obstruction in the setting of sepsis.   Overall improving.   Right ureteral obstruction with infection from 76mm right mid-distal ureteral stone. Presented with sepsis in the setting of right hydroureteronephrosis secondary to large 31mm ureteral calculi s/p right PCN with IR on 04/19/2020. Transferred to ICU and weaned off vasopressors in <24hr. Voided urine culture was without growth likely secondary to no passage of infected urine past stone given Right renal pelvis urine aspirate grew 100k E Coli.   - Continue right PCN - Continue strict I/Os  - Narrow antibiotics per primary based on E coli urine culture susceptibilities.  - No plans for further  urologic interventions during this admission   LOS: 2 days

## 2020-04-22 LAB — BASIC METABOLIC PANEL
Anion gap: 6 (ref 5–15)
BUN: 35 mg/dL — ABNORMAL HIGH (ref 8–23)
CO2: 21 mmol/L — ABNORMAL LOW (ref 22–32)
Calcium: 7.6 mg/dL — ABNORMAL LOW (ref 8.9–10.3)
Chloride: 121 mmol/L — ABNORMAL HIGH (ref 98–111)
Creatinine, Ser: 1 mg/dL (ref 0.44–1.00)
GFR calc Af Amer: 59 mL/min — ABNORMAL LOW (ref >60)
GFR calc non Af Amer: 51 mL/min — ABNORMAL LOW (ref >60)
Glucose, Bld: 98 mg/dL (ref 70–99)
Potassium: 3.7 mmol/L (ref 3.5–5.1)
Sodium: 148 mmol/L — ABNORMAL HIGH (ref 135–145)

## 2020-04-22 LAB — CBC
HCT: 25.5 % — ABNORMAL LOW (ref 36.0–46.0)
Hemoglobin: 8 g/dL — ABNORMAL LOW (ref 12.0–15.0)
MCH: 31.4 pg (ref 26.0–34.0)
MCHC: 31.4 g/dL (ref 30.0–36.0)
MCV: 100 fL (ref 80.0–100.0)
Platelets: 164 10*3/uL (ref 150–400)
RBC: 2.55 MIL/uL — ABNORMAL LOW (ref 3.87–5.11)
RDW: 15 % (ref 11.5–15.5)
WBC: 22.3 10*3/uL — ABNORMAL HIGH (ref 4.0–10.5)
nRBC: 0 % (ref 0.0–0.2)

## 2020-04-22 LAB — PHOSPHORUS: Phosphorus: 2.6 mg/dL (ref 2.5–4.6)

## 2020-04-22 LAB — MAGNESIUM: Magnesium: 2.6 mg/dL — ABNORMAL HIGH (ref 1.7–2.4)

## 2020-04-22 MED ORDER — CEFAZOLIN SODIUM-DEXTROSE 1-4 GM/50ML-% IV SOLN
1.0000 g | Freq: Three times a day (TID) | INTRAVENOUS | Status: DC
  Administered 2020-04-22 – 2020-04-25 (×9): 1 g via INTRAVENOUS
  Filled 2020-04-22 (×12): qty 50

## 2020-04-22 MED ORDER — PREDNISONE 20 MG PO TABS
20.0000 mg | ORAL_TABLET | Freq: Every day | ORAL | Status: DC
  Administered 2020-04-23 – 2020-04-25 (×3): 20 mg via ORAL
  Filled 2020-04-22 (×4): qty 1

## 2020-04-22 NOTE — Progress Notes (Signed)
PROGRESS NOTE  [name] [id number] DOB: 01-28-1931 DOA: 04/19/2020 PCP: [name], MD   LOS: 3 days   Brief Narrative / Interim history: 86 year old female with history of Parkinson's disease, hypothyroidism, mild dementia, hyperlipidemia, admitted to the ICU on 5/11 with septic shock due to UTI.  She initially required pressors.  She was found to have an obstructive 11 x 7 mm stone in the right ureter at the level of the pelvic inlet.  Urology and IR were consulted and she is status post nephrostomy tube placement on the right side.  Microbiology revealed E. coli in the urine which is intermediate to ampicillin but otherwise sensitive.  She has been maintained on ceftriaxone.  Blood cultures have remained negative so far  Subjective / 24h Interval events: In bed, husband at bedside.  She is complaining of the nephrostomy tube being uncomfortable and also reports some burning with urination which is persistent  Assessment & Plan: Principal Problem Septic shock due to E. coli UTI, obstructive right kidney stone POA-patient was initially admitted to the ICU requiring vasopressors.  She is status post right-sided percutaneous nephrostomy tube.  Clinically she is improving and was transferred to the hospitalist service 5/13. -Urine cultures are showing E. coli intermediate to ampicillin but otherwise sensitive.  Narrowed to Ancef today.  Blood cultures have remained negative  Active Problems Acute kidney injury-in setting of septic shock, creatinine 1.0 today  Hypokalemia/hypomagnesemia-replete as indicated  Anemia-in the setting of shock, hemoglobin overall stable, no evidence of bleeding  Facial swelling /hand swelling-resolved, unclear etiology, apparently received Lasix on 5/11.  Stable  Mild Transaminitis  -Likely due to shock, LFTs improving  Elevated Troponin  Known RBBB -Suspect demand ischemia in the setting of shock  Hx Sjogrens  -On plaquenil and  prednisone. -hold plaquenil  -Decrease steroids from IV to prednisone tomorrow  Hx hypothyroidism -continue synthroid    Hx GERD -PPI   Hx HTN, HLD, RBB, dCHF, AS. -continue home ASA, ezetimibe, rosuvastatin  -tele monitoring   Hx depression, visual hallucinations, fibromyalgia, anxiety, parkinsons, RLS -continue home sinemet, quetiapine, sertraline   Aortic stenosis-2D echo done showed normal EF but severe AS.  Outpatient cardiology follow-up  Scheduled Meds: . aspirin EC  81 mg Oral Daily  . carbidopa-levodopa  2 tablet Oral BID  . Chlorhexidine Gluconate Cloth  6 each Topical Daily  . cholecalciferol  2,000 Units Oral Daily  . docusate sodium  100 mg Oral QHS  . ezetimibe  10 mg Oral QHS  . hydrocortisone sod succinate (SOLU-CORTEF) inj  40 mg Intravenous Q12H  . levothyroxine  50 mcg Oral QAC breakfast  . lubiprostone  24 mcg Oral Daily  . mouth rinse  15 mL Mouth Rinse BID  . multivitamin with minerals  1 tablet Oral Daily  . pantoprazole  40 mg Oral Daily  . pramipexole  1 mg Oral BID  . QUEtiapine  12.5 mg Oral Daily  . QUEtiapine  25 mg Oral QHS  . rosuvastatin  20 mg Oral Daily  . saccharomyces boulardii  250 mg Oral BID  . senna-docusate  1 tablet Oral QHS  . sertraline  75 mg Oral QHS  . sodium chloride flush  10-40 mL Intracatheter Q12H  . sodium chloride flush  5 mL Intracatheter Q8H   Continuous Infusions: . sodium chloride Stopped (04/19/20 1535)  .  ceFAZolin (ANCEF) IV 1 g (04/22/20 1104)   PRN Meds:.acetaminophen **OR** acetaminophen, albuterol, ondansetron **OR** ondansetron (ZOFRAN) IV, polyvinyl alcohol, sodium chloride  flush  DVT prophylaxis: SCDs Code Status: Partial code Family Communication: Husband present at bedside  Status is: Inpatient  Remains inpatient appropriate because:Inpatient level of care appropriate due to severity of illness   Dispo: The patient is from: SNF              Anticipated d/c is to: SNF               Anticipated d/c date is: 2 days              Patient currently is not medically stable to d/c.  Consultants:  Urology IR PCCM  Procedures:  2D echo:  IMPRESSIONS  1. Since the last study on 12/30/2019 aortic stenosis is now severe - peak/mean transaortic gradients 66/41 mmHg and dimensionless index 0.21. Referral for a TAVR procedure should be considered.  2. Left ventricular ejection fraction, by estimation, is 60 to 65%. The left ventricle has normal function. The left ventricle has no regional wall motion abnormalities. There is mild concentric left ventricular hypertrophy. Left ventricular diastolic parameters are consistent with Grade I diastolic dysfunction (impaired relaxation). Elevated left atrial pressure.  3. Right ventricular systolic function is normal. The right ventricular size is normal.  4. The mitral valve is normal in structure. Mild mitral valve regurgitation. No evidence of mitral stenosis.  5. The aortic valve is normal in structure. Aortic valve regurgitation is not visualized. Severe aortic valve stenosis. Aortic valve mean gradient measures 41.0 mmHg.  6. The inferior vena cava is normal in size with greater than 50% respiratory variability, suggesting right atrial pressure of 3 mmHg.   Microbiology  Urine cultures - E coli  Antimicrobials: Ceftriaxone     Objective: Vitals:   04/22/20 0517 04/22/20 0840 04/22/20 0907 04/22/20 1254  BP: 127/73 (!) 156/73  133/70  Pulse: 66 64  74  Resp: (!) 21 20  (!) 22  Temp: 97.6 F (36.4 C) 98.1 F (36.7 C)  97.9 F (36.6 C)  TempSrc: Oral Oral  Oral  SpO2: 99% 100% 98% 100%  Weight:      Height:        Intake/Output Summary (Last 24 hours) at 04/22/2020 1401 Last data filed at 04/22/2020 1248 Gross per 24 hour  Intake 555 ml  Output 875 ml  Net -320 ml   Filed Weights   04/19/20 0157 04/19/20 1730  Weight: 65.3 kg 71.3 kg    Examination:  Constitutional: No distress Eyes: No icterus ENMT: Moist  mucous membranes Neck: normal, supple Respiratory: Clear, no wheezing Cardiovascular: Regular rate and rhythm, 3/6 SEM, no edema Abdomen: Nondistended, bowel sounds positive Musculoskeletal: no clubbing / cyanosis.  Skin: No rash appreciated Neurologic: Grossly nonfocal  Data Reviewed: I have independently reviewed following labs and imaging studies   CBC: Recent Labs  Lab 04/19/20 0200 04/19/20 1442 04/20/20 0020 04/21/20 0325 04/22/20 0903  WBC 1.0* 24.2* 20.3* 23.1* 22.3*  NEUTROABS 0.7*  --   --   --   --   HGB 9.1* 8.0* 7.8* 7.5* 8.0*  HCT 29.5* 25.3* 24.9* 24.2* 25.5*  MCV 101.4* 99.6 99.2 100.0 100.0  PLT 131* 163 136* 136* [id number]   Basic Metabolic Panel: Recent Labs  Lab 04/19/20 0200 04/19/20 1442 04/20/20 0455 04/21/20 0325 04/22/20 0605 04/22/20 0903  NA 138 142 143 143  --  148*  K 3.4* 3.7 4.0 3.4*  --  3.7  CL 107 114* 114* 115*  --  121*  CO2 19* 17* 17* 20*  --  21*  GLUCOSE 108* 102* 108* 129*  --  98  BUN 29* 26* 28* 35*  --  35*  CREATININE 1.64* 1.63* 1.47* 1.23*  --  1.00  CALCIUM 7.7* 6.8* 6.9* 7.2*  --  7.6*  MG  --   --  1.5* 3.0* 2.6*  --   PHOS  --   --  3.6 3.2 2.6  --    Liver Function Tests: Recent Labs  Lab 04/19/20 0200 04/19/20 1442 04/20/20 0455 04/21/20 0325  AST 53* 217* 117* 50*  ALT 20 77* 18 8  ALKPHOS 99 109 95 95  BILITOT 0.6 0.5 0.5 0.5  PROT 5.4* 5.1* 5.1* 5.4*  ALBUMIN 2.7* 2.3* 2.3* 2.3*   Coagulation Profile: Recent Labs  Lab 04/19/20 0200  INR 1.3*   HbA1C: No results for input(s): HGBA1C in the last 72 hours. CBG: Recent Labs  Lab 04/19/20 0200  GLUCAP 94    Recent Results (from the past 240 hour(s))  Blood Culture (routine x 2)     Status: None (Preliminary result)   Collection Time: 04/19/20  2:05 AM   Specimen: BLOOD  Result Value Ref Range Status   Specimen Description   Final    BLOOD RIGHT ANTECUBITAL Performed at [facility] [address], [place], [place]  [address]    Special Requests   Final    BOTTLES DRAWN AEROBIC AND ANAEROBIC Blood Culture adequate volume Performed at [facility] [address], [place], [place] [address]    Culture   Final    NO GROWTH 2 DAYS Performed at [facility] [address] [place], [place] [address]    Report Status PENDING  Incomplete  Blood Culture (routine x 2)     Status: None (Preliminary result)   Collection Time: 04/19/20  2:14 AM   Specimen: BLOOD  Result Value Ref Range Status   Specimen Description   Final    BLOOD LEFT ANTECUBITAL Performed at [facility] [address] [place], [place] [address]    Special Requests   Final    BOTTLES DRAWN AEROBIC AND ANAEROBIC Blood Culture adequate volume Performed at [facility] [address] [place], [place] [address]    Culture   Final    NO GROWTH 2 DAYS Performed at [facility] [address] [place], [place] [address]    Report Status PENDING  Incomplete  Urine culture     Status: None   Collection Time: 04/19/20  4:11 AM   Specimen: In/Out Cath Urine  Result Value Ref Range Status   Specimen Description   Final    IN/OUT CATH URINE Performed at [facility] [address], [place], [place] [address]    Special Requests   Final    NONE Performed at [facility], [place] [address] [place], [place] [address]    Culture   Final    NO GROWTH Performed at [facility], [place] [address] [place], [place] [address]    Report Status 04/20/2020 FINAL  Final  SARS Coronavirus 2 by RT PCR (hospital order, performed in [facility] hospital lab) Nasopharyngeal Nasopharyngeal Swab     Status: None   Collection Time: 04/19/20  5:43 AM   Specimen: Nasopharyngeal Swab  Result Value Ref Range Status   SARS Coronavirus 2 NEGATIVE NEGATIVE Final    Comment: (NOTE) SARS-CoV-2 target nucleic acids are NOT DETECTED. The SARS-CoV-2  RNA  is generally detectable in upper and lower respiratory specimens during the acute phase of infection. The lowest concentration of SARS-CoV-2 viral copies this assay can detect is 250 copies / mL. A negative result does not preclude SARS-CoV-2 infection and should not be used as the sole basis for treatment or other patient management decisions.  A negative result may occur with improper specimen collection / handling, submission of specimen other than nasopharyngeal swab, presence of viral mutation(s) within the areas targeted by this assay, and inadequate number of viral copies (<250 copies / mL). A negative result must be combined with clinical observations, patient history, and epidemiological information. Fact Sheet for Patients:   StrictlyIdeas.no Fact Sheet for Healthcare Providers: BankingDealers.co.za This test is not yet approved or cleared  by the [place] FDA and has been authorized for detection and/or diagnosis of SARS-CoV-2 by FDA under an Emergency Use Authorization (EUA).  This EUA will remain in effect (meaning this test can be used) for the duration of the COVID-19 declaration under Section 564(b)(1) of the Act, 21 U.S.C. section 360bbb-3(b)(1), unless the authorization is terminated or revoked sooner. Performed at [facility], [place] [address], [place], [place] [address]   Urine culture     Status: Abnormal   Collection Time: 04/19/20  9:49 AM   Specimen: Kidney; Urine  Result Value Ref Range Status   Specimen Description   Final    KIDNEY RIGHT Performed at [facility] [address] [place], [place] [address]    Special Requests   Final    Normal Performed at [facility], [place] [address] [place], [place] [address]    Culture >=100,000 COLONIES/mL ESCHERICHIA COLI (A)  Final   Report Status 04/21/2020 FINAL  Final   Organism ID, Bacteria ESCHERICHIA  COLI (A)  Final      Susceptibility   Escherichia coli - MIC*    AMPICILLIN 16 INTERMEDIATE Intermediate     CEFAZOLIN <=4 SENSITIVE Sensitive     CEFEPIME <=1 SENSITIVE Sensitive     CEFTAZIDIME <=1 SENSITIVE Sensitive     CEFTRIAXONE 2 SENSITIVE Sensitive     CIPROFLOXACIN 1 SENSITIVE Sensitive     GENTAMICIN <=1 SENSITIVE Sensitive     IMIPENEM <=0.25 SENSITIVE Sensitive     TRIMETH/SULFA <=20 SENSITIVE Sensitive     AMPICILLIN/SULBACTAM <=2 SENSITIVE Sensitive     * >=100,000 COLONIES/mL ESCHERICHIA COLI  MRSA PCR Screening     Status: None   Collection Time: 04/19/20  5:16 PM   Specimen: Nasal Mucosa; Nasopharyngeal  Result Value Ref Range Status   MRSA by PCR NEGATIVE NEGATIVE Final    Comment:        The GeneXpert MRSA Assay (FDA approved for NASAL specimens only), is one component of a comprehensive MRSA colonization surveillance program. It is not intended to diagnose MRSA infection nor to guide or monitor treatment for MRSA infections. Performed at [facility], [place] [address], [place], [place] [address]      Radiology Studies: No results found.  Marzetta Board, MD, PhD [facility]  Between 7 am - 7 pm I am available, please contact me via Amion or Securechat  Between 7 pm - 7 am I am not available, please contact night coverage MD/APP via Amion

## 2020-04-22 NOTE — NC FL2 (Signed)
[place] LEVEL OF CARE SCREENING TOOL     IDENTIFICATION  Patient Name: [name] Birthdate: 1931-04-06 Sex: female Admission Date (Current Location): 04/19/2020  [facility] and [place] Number:  Herbalist and Address:  [facility],  [place], [place]      Provider Number: [id number]  Attending Physician Name and Address:  Caren Griffins, MD  Relative Name and Phone Number:  [facility] Spouse [phone]  [phone] or [name] Daughter   [phone]    Current Level of Care: Hospital Recommended Level of Care: [place] Prior Approval Number:    Date Approved/Denied:   PASRR Number: [id number]  Discharge Plan: SNF    Current Diagnoses: Patient Active Problem List   Diagnosis Date Noted  . Septic shock ([place]) 04/19/2020  . Obstruction of right ureteropelvic junction due to stone   . Acute kidney injury ([place]) 04/18/2020  . Fever 04/15/2020  . Erosive (osteo)arthritis 03/09/2020  . Right hand pain 02/01/2020  . Dysuria 10/16/2019  . UTI (urinary tract infection) 10/12/2019  . Fall 09/21/2019  . Confusion 07/24/2019  . Depression, psychotic ([place]) 05/08/2019  . Nose dryness 04/23/2019  . Actinic cheilitis 04/23/2019  . Skin lesion 03/05/2019  . DOE (dyspnea on exertion) 12/18/2018  . Ingrowing nail, right great toe 12/18/2018  . Iron deficiency anemia 10/09/2018  . Renal lithiasis 09/19/2018  . Urinary frequency 09/18/2018  . Corns and callus 09/18/2018  . Hallucinations, visual 07/21/2018  . History of recent fall 07/21/2018  . Acute right-sided low back pain 05/22/2018  . Unstable gait 04/21/2018  . Memory loss 04/21/2018  . Abnormal nuclear stress test   . Hypothyroidism 02/04/2018  . Chronic back pain 02/04/2018  . Osteoporosis without current pathological fracture 02/04/2018  . Recurrent UTI 02/04/2018  . Sjogren's syndrome ([place]) 02/04/2018  . Chronic  constipation 02/04/2018  . RBBB   . Aortic stenosis   . Excessive cerumen in both ear canals 09/24/2016  . Sensorineural hearing loss (SNHL), bilateral 09/24/2016  . Temporomandibular jaw dysfunction 09/24/2016  . Acute lower UTI 03/24/2015  . Microhematuria 03/24/2015  . Follow up 02/15/2015  . Other dysphagia 09/03/2014  . Unspecified hereditary and idiopathic peripheral neuropathy 09/03/2014  . Heart murmur 04/19/2014  . GERD (gastroesophageal reflux disease)   . Hypercholesterolemia   . PVD (peripheral vascular disease) ([place])   . Barrett esophagus   . Chronic diastolic CHF (congestive heart failure) ([place])   . Parkinson's disease ([place]) 08/26/2013  . Swelling of both ankles 11/20/2012  . Fracture of multiple pubic rami ([place]) 07/17/2012  . Hematoma of hip 07/17/2012  . Leukocytosis 07/17/2012  . Hypertension   . Atherosclerosis of native arteries of the extremities with intermittent claudication 05/22/2012    Orientation RESPIRATION BLADDER Height & Weight     Self, Time, Situation  Normal Incontinent Weight: 157 lb 3 oz (71.3 kg) Height:  5\' 2"  (157.5 cm)  BEHAVIORAL SYMPTOMS/MOOD NEUROLOGICAL BOWEL NUTRITION STATUS      Continent Diet(Cardiac diet)  AMBULATORY STATUS COMMUNICATION OF NEEDS Skin   Limited Assist Verbally Normal                       Personal Care Assistance Level of Assistance  Dressing, Feeding, Bathing Bathing Assistance: Limited assistance Feeding assistance: Independent Dressing Assistance: Limited assistance     Functional Limitations Info  Sight, Hearing, Speech Sight Info: Adequate Hearing Info: Adequate Speech Info: Adequate    SPECIAL  CARE FACTORS FREQUENCY  PT (By licensed PT), OT (By licensed OT)     PT Frequency: Min 5x a week OT Frequency: Min 5x a week            Contractures Contractures Info: Not present    Additional Factors Info  Allergies, Psychotropic   Allergies Info: Adrenalone Lactose Morphine Sulfa  Antibiotics Codeine Lactose Intolerance, Latex Lyrica, Plaquenil, Reglan, Septra, Shellfish Allergy Psychotropic Info: QUEtiapine (SEROQUEL) tablet 25 mg sertraline (ZOLOFT) tablet 75 mg         Current Medications (04/22/2020):  This is the current hospital active medication list Current Facility-Administered Medications  Medication Dose Route Frequency Provider Last Rate Last Admin  . 0.9 %  sodium chloride infusion  250 mL Intravenous Continuous Etta Quill, DO   Stopped at 04/19/20 1535  . acetaminophen (TYLENOL) tablet 650 mg  650 mg Oral Q6H PRN Etta Quill, DO   650 mg at 04/19/20 2129   Or  . acetaminophen (TYLENOL) suppository 650 mg  650 mg Rectal Q6H PRN Etta Quill, DO      . albuterol (PROVENTIL) (2.5 MG/3ML) 0.083% nebulizer solution 2.5 mg  2.5 mg Nebulization Q6H PRN Etta Quill, DO   2.5 mg at 04/22/20 0907  . aspirin EC tablet 81 mg  81 mg Oral Daily Etta Quill, DO   81 mg at 04/22/20 1100  . carbidopa-levodopa (SINEMET IR) 25-100 MG per tablet immediate release 2 tablet  2 tablet Oral BID Etta Quill, DO   2 tablet at 04/22/20 1100  . ceFAZolin (ANCEF) IVPB 1 g/50 mL premix  1 g Intravenous Q8H Gherghe, Costin M, MD 100 mL/hr at 04/22/20 1104 1 g at 04/22/20 1104  . Chlorhexidine Gluconate Cloth 2 % PADS 6 each  6 each Topical Daily Brand Males, MD   6 each at 04/22/20 0930  . cholecalciferol (VITAMIN D) tablet 2,000 Units  2,000 Units Oral Daily Etta Quill, DO   2,000 Units at 04/22/20 1100  . docusate sodium (COLACE) capsule 100 mg  100 mg Oral QHS Jennette Kettle M, DO   100 mg at 04/21/20 2157  . ezetimibe (ZETIA) tablet 10 mg  10 mg Oral QHS Jennette Kettle M, DO   10 mg at 04/21/20 2157  . levothyroxine (SYNTHROID) tablet 50 mcg  50 mcg Oral QAC breakfast Etta Quill, DO   50 mcg at 04/22/20 [id number]  . lubiprostone (AMITIZA) capsule 24 mcg  24 mcg Oral Daily Jennette Kettle M, DO   24 mcg at 04/22/20 1100  . MEDLINE mouth rinse   15 mL Mouth Rinse BID Brand Males, MD   15 mL at 04/22/20 0930  . multivitamin with minerals tablet 1 tablet  1 tablet Oral Daily Jennette Kettle M, DO   1 tablet at 04/22/20 1100  . ondansetron (ZOFRAN) tablet 4 mg  4 mg Oral Q6H PRN Etta Quill, DO       Or  . ondansetron [facility]) injection 4 mg  4 mg Intravenous Q6H PRN Etta Quill, DO      . pantoprazole (PROTONIX) EC tablet 40 mg  40 mg Oral Daily Brand Males, MD   40 mg at 04/22/20 1100  . polyvinyl alcohol (LIQUIFILM TEARS) 1.4 % ophthalmic solution 1 drop  1 drop Both Eyes BID PRN Brand Males, MD      . pramipexole (MIRAPEX) tablet 1 mg  1 mg Oral BID Jennette Kettle M, DO   1 mg  at 04/22/20 1100  . [START ON 04/23/2020] predniSONE (DELTASONE) tablet 20 mg  20 mg Oral Q breakfast Cruzita Lederer, Costin M, MD      . QUEtiapine (SEROQUEL) tablet 12.5 mg  12.5 mg Oral Daily Brand Males, MD   12.5 mg at 04/22/20 1100  . QUEtiapine (SEROQUEL) tablet 25 mg  25 mg Oral QHS Jennette Kettle M, DO   25 mg at 04/21/20 2157  . rosuvastatin (CRESTOR) tablet 20 mg  20 mg Oral Daily Jennette Kettle M, DO   20 mg at 04/22/20 1100  . saccharomyces boulardii (FLORASTOR) capsule 250 mg  250 mg Oral BID Jennette Kettle M, DO   250 mg at 04/22/20 1100  . senna-docusate (Senokot-S) tablet 1 tablet  1 tablet Oral QHS Etta Quill, DO   1 tablet at 04/21/20 2157  . sertraline (ZOLOFT) tablet 75 mg  75 mg Oral QHS Etta Quill, DO   75 mg at 04/21/20 2156  . sodium chloride flush (NS) 0.9 % injection 10-40 mL  10-40 mL Intracatheter Q12H Brand Males, MD   10 mL at 04/22/20 0930  . sodium chloride flush (NS) 0.9 % injection 10-40 mL  10-40 mL Intracatheter PRN Brand Males, MD   10 mL at 04/22/20 0606  . sodium chloride flush (NS) 0.9 % injection 5 mL  5 mL Intracatheter Q8H Sandi Mariscal, MD   5 mL at 04/22/20 [id number]     Discharge Medications: Please see discharge summary for a list of discharge medications.  Relevant  Imaging Results:  Relevant Lab Results:   Additional Information SSN [id number]  Ross Ludwig, LCSW

## 2020-04-22 NOTE — Evaluation (Signed)
Physical Therapy Evaluation Patient Details Name: [name] MRN: [id number] DOB: Nov 24, 1931 Today's Date: 04/22/2020   History of Present Illness  86 year old female with history of Parkinson's disease, hypothyroidism, mild dementia, hyperlipidemia, admitted to the ICU on 5/11 with septic shock due to UTI.  She initially required pressors.  She was found to have an obstructive 11 x 7 mm stone in the right ureter at the level of the pelvic inlet.  Urology and IR were consulted and she is status post nephrostomy tube placement on the right side  Clinical Impression  Pt admitted with above diagnosis.  Pt currently with functional limitations due to the deficits listed below (see PT Problem List). Pt will benefit from skilled PT to increase their independence and safety with mobility to allow discharge to the venue listed below.  Pt assisted with standing and performed weight shifting however unable to lift feet from floor.  Pt also very fearful of falling.  Pt attempted to improved posture and BOS with standing however continues to require assist.  Pt reports receiving therapy at SNF and reports her and her husband live in different sections.  RN reports spouse stated that pt uses w/c.  Recommend d/c to SNF.       Follow Up Recommendations SNF    Equipment Recommendations  None recommended by PT    Recommendations for Other Services       Precautions / Restrictions Precautions Precautions: Fall Precaution Comments: Right PCN      Mobility  Bed Mobility               General bed mobility comments: pt in recliner on arrival  Transfers Overall transfer level: Needs assistance Equipment used: Rolling walker (2 wheeled) Transfers: Sit to/from Stand Sit to Stand: Max assist;+2 physical assistance         General transfer comment: multimodal cues for technique and upright posture.  Pt limited by weakness and posterior lean, pt keeps legs against recliner and trunk flexed  forward and unable to correct even with cues and assist; briefly able to perform weight shifting however limited marching  Ambulation/Gait             General Gait Details: pt unable today, fatigued quickly and fearful of falling  Stairs            Wheelchair Mobility    Modified Rankin (Stroke Patients Only)       Balance Overall balance assessment: History of Falls;Needs assistance       Postural control: Posterior lean Standing balance support: Bilateral upper extremity supported Standing balance-Leahy Scale: Zero                               Pertinent Vitals/Pain Pain Assessment: No/denies pain    Home Living Family/patient expects to be discharged to:: Skilled nursing facility                 Additional Comments: from [place], pt and spouse live in different sections    Prior Function Level of Independence: Needs assistance   Gait / Transfers Assistance Needed: pt reports ambulating with RW prior to admission     Comments: Per RN, spouse states pt has been in w/c, pt reports performing PT at SNF     Hand Dominance   Dominant Hand: Right    Extremity/Trunk Assessment        Lower Extremity Assessment Lower Extremity Assessment: Generalized weakness  Cervical / Trunk Assessment Cervical / Trunk Assessment: Kyphotic  Communication   Communication: No difficulties  Cognition Arousal/Alertness: Awake/alert Behavior During Therapy: WFL for tasks assessed/performed Overall Cognitive Status: History of cognitive impairments - at baseline                                 General Comments: hx mild dementia, pt fearful of falling      General Comments      Exercises     Assessment/Plan    PT Assessment Patient needs continued PT services  PT Problem List Decreased strength;Decreased mobility;Decreased activity tolerance;Decreased balance;Decreased knowledge of use of DME;Decreased coordination        PT Treatment Interventions DME instruction;Therapeutic activities;Gait training;Therapeutic exercise;Functional mobility training;Balance training;Patient/family education;Neuromuscular re-education    PT Goals (Current goals can be found in the Care Plan section)  Acute Rehab PT Goals PT Goal Formulation: With patient Time For Goal Achievement: 05/06/20 Potential to Achieve Goals: Fair    Frequency Min 2X/week   Barriers to discharge        Co-evaluation PT/OT/SLP Co-Evaluation/Treatment: Yes Reason for Co-Treatment: To address functional/ADL transfers;For patient/therapist safety PT goals addressed during session: Mobility/safety with mobility OT goals addressed during session: ADL's and self-care       AM-PAC PT "6 Clicks" Mobility  Outcome Measure Help needed turning from your back to your side while in a flat bed without using bedrails?: A Lot Help needed moving from lying on your back to sitting on the side of a flat bed without using bedrails?: A Lot Help needed moving to and from a bed to a chair (including a wheelchair)?: A Lot Help needed standing up from a chair using your arms (e.g., wheelchair or bedside chair)?: A Lot Help needed to walk in hospital room?: Total Help needed climbing 3-5 steps with a railing? : Total 6 Click Score: 10    End of Session Equipment Utilized During Treatment: Gait belt Activity Tolerance: Patient tolerated treatment well Patient left: in chair(with OT) Nurse Communication: Mobility status PT Visit Diagnosis: Other abnormalities of gait and mobility (R26.89)    Time: 1136-1150 PT Time Calculation (min) (ACUTE ONLY): 14 min   Charges:   PT Evaluation $PT Eval Low Complexity: 1 Low        Kati PT, DPT Acute Rehabilitation Services Office: [phone]  York Ram E 04/22/2020, 1:12 PM

## 2020-04-22 NOTE — Evaluation (Signed)
Occupational Therapy Evaluation Patient Details Name: [name] MRN: [id number] DOB: May 21, 1931 Today's Date: 04/22/2020    History of Present Illness 86 year old female with history of Parkinson's disease, hypothyroidism, mild dementia, hyperlipidemia, admitted to the ICU on 5/11 with septic shock due to UTI.  She initially required pressors.  She was found to have an obstructive 11 x 7 mm stone in the right ureter at the level of the pelvic inlet.  Urology and IR were consulted and she is status post nephrostomy tube placement on the right side   Clinical Impression   Patient with functional deficits listed below impacting safety and independence with self care. Unsure of baseline, per patient was working with PT recently, walking with walker and was not requiring assist for self care other than bathing. Per RN spouse states patient has been in a wheelchair. Patient max A x2 to stand with forward flexed posture, unable to fully extend hips despite verbal/tactile cues and with narrow base of support. Will continue to follow.    Follow Up Recommendations  SNF;Supervision/Assistance - 24 hour    Equipment Recommendations  None recommended by OT       Precautions / Restrictions Precautions Precautions: Fall Precaution Comments: Right PCN Restrictions Weight Bearing Restrictions: No      Mobility Bed Mobility               General bed mobility comments: pt in recliner on arrival  Transfers Overall transfer level: Needs assistance Equipment used: Rolling walker (2 wheeled) Transfers: Sit to/from Stand Sit to Stand: Max assist;+2 physical assistance         General transfer comment: multimodal cues for technique and upright posture.  Pt limited by weakness and posterior lean, pt keeps legs against recliner and trunk flexed forward and unable to correct even with cues and assist;     Balance Overall balance assessment: History of Falls;Needs assistance        Postural control: Posterior lean Standing balance support: Bilateral upper extremity supported Standing balance-Leahy Scale: Zero                             ADL either performed or assessed with clinical judgement   ADL Overall ADL's : Needs assistance/impaired     Grooming: Oral care;Set up;Sitting   Upper Body Bathing: Set up;Sitting   Lower Body Bathing: Maximal assistance;Sit to/from stand;Sitting/lateral leans   Upper Body Dressing : Sitting;Minimal assistance   Lower Body Dressing: Maximal assistance;Sitting/lateral leans;Sit to/from stand   Toilet Transfer: Maximal assistance;+2 for physical assistance;+2 for safety/equipment;BSC;RW Toilet Transfer Details (indicate cue type and reason): simulated with functional mobility, unable to fully extend hips, narrow base of support Toileting- Clothing Manipulation and Hygiene: Total assistance       Functional mobility during ADLs: Maximal assistance;+2 for physical assistance;+2 for safety/equipment General ADL Comments: unsure of patient baseline due to patient hx of dementia, currently requiring extensive assist with LB ADL and functional transfers due to decreased balance, strength, activity tolerance                  Pertinent Vitals/Pain Pain Assessment: No/denies pain     Hand Dominance Right   Extremity/Trunk Assessment Upper Extremity Assessment Upper Extremity Assessment: Generalized weakness   Lower Extremity Assessment Lower Extremity Assessment: Defer to PT evaluation   Cervical / Trunk Assessment Cervical / Trunk Assessment: Kyphotic   Communication Communication Communication: No difficulties   Cognition Arousal/Alertness: Awake/alert Behavior  During Therapy: WFL for tasks assessed/performed Overall Cognitive Status: History of cognitive impairments - at baseline                                 General Comments: hx mild dementia, pt fearful of falling; oriented to  place/time with very minimal cues              Home Living Family/patient expects to be discharged to:: Skilled nursing facility                                 Additional Comments: from [place], pt and spouse live in different sections      Prior Functioning/Environment Level of Independence: Needs assistance  Gait / Transfers Assistance Needed: pt reports ambulating with RW prior to admission ADL's / Homemaking Assistance Needed: patient reports assist with bathing, otherwise independent with self care   Comments: Per RN, spouse states pt has been in w/c, pt reports performing PT at SNF        OT Problem List: Decreased strength;Decreased activity tolerance;Impaired balance (sitting and/or standing);Decreased safety awareness      OT Treatment/Interventions: Self-care/ADL training;Therapeutic exercise;Therapeutic activities;Patient/family education;Balance training    OT Goals(Current goals can be found in the care plan section) Acute Rehab OT Goals Patient Stated Goal: to walk OT Goal Formulation: With patient Time For Goal Achievement: 05/06/20 Potential to Achieve Goals: Good  OT Frequency: Min 2X/week           Co-evaluation PT/OT/SLP Co-Evaluation/Treatment: Yes Reason for Co-Treatment: For patient/therapist safety;To address functional/ADL transfers PT goals addressed during session: Mobility/safety with mobility OT goals addressed during session: ADL's and self-care      AM-PAC OT "6 Clicks" Daily Activity     Outcome Measure Help from another person eating meals?: A Little Help from another person taking care of personal grooming?: A Little Help from another person toileting, which includes using toliet, bedpan, or urinal?: Total Help from another person bathing (including washing, rinsing, drying)?: A Lot Help from another person to put on and taking off regular upper body clothing?: A Little Help from another person to put on and  taking off regular lower body clothing?: Total 6 Click Score: 13   End of Session Equipment Utilized During Treatment: Rolling walker;Gait belt  Activity Tolerance: Patient tolerated treatment well Patient left: in chair;with call bell/phone within reach;with chair alarm set  OT Visit Diagnosis: Unsteadiness on feet (R26.81);Other abnormalities of gait and mobility (R26.89);Muscle weakness (generalized) (M62.81);History of falling (Z91.81)                Time: [id number] OT Time Calculation (min): 27 min Charges:  OT General Charges $OT Visit: 1 Visit OT Evaluation $OT Eval Moderate Complexity: [place] OT Pager: [place] 04/22/2020, 2:26 PM

## 2020-04-22 NOTE — Progress Notes (Signed)
Referring Physician(s): Gardner,J/Wrenn,J  Supervising Physician: Sandi Mariscal  Patient Status:  [facility] - In-pt  Chief Complaint: Abdominal pain, right kidney stone, sepsis, right hydronephrosis   Subjective: Pt now out of ICU; sitting up in chair; has some rt flank soreness at PCN site; otherwise ok   Allergies: Adrenalone, Lactose, Morphine, Sulfa antibiotics, Codeine, Lactose intolerance (gi), Latex, Lyrica [pregabalin], Other, Plaquenil [hydroxychloroquine sulfate], Reglan [metoclopramide], Requip [ropinirole hcl], Septra [sulfamethoxazole-trimethoprim], and Shellfish allergy  Medications: Prior to Admission medications   Medication Sig Start Date End Date Taking? Authorizing Provider  acetaminophen (TYLENOL) 500 MG tablet Take 500 mg by mouth daily as needed for mild pain.    Yes [provider]  acetaminophen (TYLENOL) 500 MG tablet Take 500 mg by mouth 2 (two) times daily.   Yes [provider]  albuterol (PROVENTIL HFA;VENTOLIN HFA) 108 (90 Base) MCG/ACT inhaler Inhale 2 puffs into the lungs every 6 (six) hours as needed for wheezing or shortness of breath. 01/15/19  Yes Collene Gobble, MD  aspirin EC 81 MG tablet Take 81 mg by mouth daily.    Yes [provider]  Calcium Carb-Cholecalciferol (CALCIUM 600/VITAMIN D3) 600-800 MG-UNIT TABS Take 1 tablet by mouth daily.   Yes [provider]  carbidopa-levodopa (SINEMET IR) 25-100 MG tablet Take 2 tablets by mouth in the morning and at bedtime.    Yes [provider]  Cholecalciferol (VITAMIN D) 2000 units CAPS Take 2,000 Units by mouth daily.   Yes [provider]  clotrimazole-betamethasone (LOTRISONE) cream Apply 1 application topically 2 (two) times daily. 04/11/20  Yes Trula Slade, DPM  docusate sodium (COLACE) 100 MG capsule Take 100 mg by mouth at bedtime.   Yes [provider]  ezetimibe (ZETIA) 10 MG tablet TAKE 1 TABLET ONCE DAILY. 06/01/19  Yes [name], MD  Ferrous Sulfate (SLOW FE) 142 (45 Fe) MG TBCR Take 1 tablet by mouth. Daily   Yes [provider]  hydrocortisone 2.5 % cream Apply 1 application topically 2 (two) times daily.   Yes [provider]  hydroxychloroquine (PLAQUENIL) 200 MG tablet Take 200 mg by mouth daily.   Yes [provider]  lansoprazole (PREVACID SOLUTAB) 30 MG disintegrating tablet Take 30 mg by mouth daily at 12 noon.   Yes [provider]  levothyroxine (SYNTHROID, LEVOTHROID) 50 MCG tablet Take 50 mcg by mouth daily before breakfast.    Yes [provider]  lubiprostone (AMITIZA) 24 MCG capsule Take 24 mcg by mouth daily.    Yes [provider]  Multiple Vitamin (MULTIVITAMIN WITH MINERALS) TABS tablet Take 1 tablet by mouth daily.   Yes [provider]  Multiple Vitamins-Minerals (PRESERVISION AREDS 2) CHEW Chew 1 tablet by mouth daily.    Yes [provider]  nitrofurantoin, macrocrystal-monohydrate, (MACROBID) 100 MG capsule Take 100 mg by mouth 2 (two) times daily.   Yes [provider]  nystatin (MYCOSTATIN/NYSTOP) powder Apply topically 2 (two) times daily as needed. Patient taking differently: Apply topically 2 (two) times daily as needed (dermatitis).  12/18/18  Yes [name], Man X, NP  Polyethyl Glycol-Propyl Glycol (SYSTANE) 0.4-0.3 % SOLN Apply 1 drop to eye 2 (two) times daily as needed (dry eyes).    Yes [provider]  pramipexole (MIRAPEX) 1 MG tablet Take 1 mg by mouth 2 (two) times daily.   Yes [provider]  QUEtiapine (SEROQUEL) 25 MG tablet Take 12.5-25 mg by mouth See admin instructions. Take 12.5  mg in Am and 25 mg in PM   Yes [provider]  rosuvastatin (CRESTOR) 20 MG tablet Take 1 tablet (20 mg total) by mouth daily. 05/01/19 04/30/20 Yes Turner, Eber Hong, MD  saccharomyces boulardii (FLORASTOR) 250 MG capsule Take 250 mg by mouth 2 (two) times daily.   Yes [provider]    senna-docusate (SENOKOT-S) 8.6-50 MG tablet Take 1 tablet by mouth at bedtime.    Yes [provider]  sertraline (ZOLOFT) 50 MG tablet Take 75 mg by mouth daily. Take 1.5 tablets to = 75 mg   Yes [provider]  Skin Protectants, Misc. (EUCERIN) cream Apply 1 application topically daily.    Yes [provider]  Spacer/Aero-Holding Chambers (AEROCHAMBER MV) inhaler by Other route. Use as instructed as needed   Yes [provider]     Vital Signs: BP (!) 156/73 (BP Location: Right Arm)   Pulse 64   Temp 98.1 F (36.7 C) (Oral)   Resp 20   Ht 5\' 2"  (1.575 m)   Wt 157 lb 3 oz (71.3 kg)   SpO2 98%   BMI 28.75 kg/m   Physical Exam awake/conversant; rt PCN intact, insertion site ok, dressing dry, site mildly tender, OP 375 cc blood-tinged urine   Imaging: DG Chest 1 View  Result Date: 04/19/2020 CLINICAL DATA:  Fever, tachypnea EXAM: CHEST  1 VIEW COMPARISON:  04/19/2020 FINDINGS: Heart is normal size. Mediastinal contours within normal limits. Spinal stimulator wires remain in stable position. No confluent airspace opacities or edema. Possible trace bilateral effusions. No acute bony abnormality. IMPRESSION: Possible trace bilateral effusions. Electronically Signed   By: Rolm Baptise M.D.   On: 04/19/2020 17:42   DG Chest Port 1 View  Result Date: 04/19/2020 CLINICAL DATA:  Shortness of breath and fever EXAM: PORTABLE CHEST 1 VIEW COMPARISON:  Chest radiograph 01/15/2019 FINDINGS: The heart size and mediastinal contours are within normal limits. Both lungs are clear. The visualized skeletal structures are unremarkable. Spinal stimulator leads overlie the midthoracic spine. IMPRESSION: No active disease. Electronically Signed   By: Ulyses Jarred M.D.   On: 04/19/2020 02:35   ECHOCARDIOGRAM COMPLETE  Result Date: 04/20/2020    ECHOCARDIOGRAM REPORT   Patient Name:   [name] Date of Exam: 04/20/2020 Medical Rec #:  [id number]         Height:        62.0 in Accession #:    [id number]        Weight:       157.2 lb Date of Birth:  23-Apr-1931        BSA:          1.726 m Patient Age:    86 years          BP:           99/58 mmHg Patient Gender: F                 HR:           85 bpm. Exam Location:  Inpatient Procedure: 2D Echo, Color Doppler and Cardiac Doppler Indications:    Acute Respiratory Insufficiency R06.89  History:        Patient has prior history of Echocardiogram examinations, most                 recent 12/30/2019. CHF; Risk Factors:Hypertension and                 Dyslipidemia.  Sonographer:    Raquel Sarna Senior RDCS Referring Phys: [place]  1. Since the last study on 12/30/2019 aortic stenosis is now severe - peak/mean transaortic gradients 66/41 mmHg and dimensionless index 0.21. Referral for a TAVR procedure should be considered.  2. Left ventricular ejection fraction, by estimation, is 60 to 65%. The left ventricle has normal function. The left ventricle has no regional wall motion abnormalities. There is mild concentric left ventricular hypertrophy. Left ventricular diastolic parameters are consistent with Grade I diastolic dysfunction (impaired relaxation). Elevated left atrial pressure.  3. Right ventricular systolic function is normal. The right ventricular size is normal.  4. The mitral valve is normal in structure. Mild mitral valve regurgitation. No evidence of mitral stenosis.  5. The aortic valve is normal in structure. Aortic valve regurgitation is not visualized. Severe aortic valve stenosis. Aortic valve mean gradient measures 41.0 mmHg.  6. The inferior vena cava is normal in size with greater than 50% respiratory variability, suggesting right atrial pressure of 3 mmHg. FINDINGS  Left Ventricle: Left ventricular ejection fraction, by estimation, is 60 to 65%. The left ventricle has normal function. The left ventricle has no regional wall motion abnormalities. The left ventricular internal cavity size was normal  in size. There is  mild concentric left ventricular hypertrophy. Left ventricular diastolic parameters are consistent with Grade I diastolic dysfunction (impaired relaxation). Elevated left atrial pressure. Right Ventricle: The right ventricular size is normal. No increase in right ventricular wall thickness. Right ventricular systolic function is normal. Left Atrium: Left atrial size was normal in size. Right Atrium: Right atrial size was normal in size. Pericardium: There is no evidence of pericardial effusion. Mitral Valve: The mitral valve is normal in structure. There is mild thickening of the mitral valve leaflet(s). There is mild calcification of the mitral valve leaflet(s). Normal mobility of the mitral valve leaflets. Moderate mitral annular calcification. Mild mitral valve regurgitation. No evidence of mitral valve stenosis. Tricuspid Valve: The tricuspid valve is normal in structure. Tricuspid valve regurgitation is mild . No evidence of tricuspid stenosis. Aortic Valve: The aortic valve is normal in structure.. There is severe thickening and severe calcifcation of the aortic valve. Aortic valve regurgitation is not visualized. Severe aortic stenosis is present. There is severe thickening of the aortic valve. There is severe calcifcation of the aortic valve. Aortic valve mean gradient measures 41.0 mmHg. Aortic valve peak gradient measures 65.3 mmHg. Aortic valve area, by VTI measures 0.85 cm. Pulmonic Valve: The pulmonic valve was normal in structure. Pulmonic valve regurgitation is not visualized. No evidence of pulmonic stenosis. Aorta: The aortic root is normal in size and structure. Venous: The inferior vena cava is normal in size with greater than 50% respiratory variability, suggesting right atrial pressure of 3 mmHg. IAS/Shunts: No atrial level shunt detected by color flow Doppler.  LEFT VENTRICLE PLAX 2D LVIDd:         3.00 cm  Diastology LVIDs:         2.50 cm  LV e' lateral:   6.64 cm/s LV PW:          1.10 cm  LV E/e' lateral: 20.2 LV IVS:        1.10 cm  LV e' medial:    4.90 cm/s LVOT diam:     2.10 cm  LV E/e' medial:  27.3 LV SV:         88 LV SV Index:   51 LVOT Area:     3.46  cm  RIGHT VENTRICLE RV S prime:     6.96 cm/s TAPSE (M-mode): 2.2 cm LEFT ATRIUM             Index LA diam:        3.80 cm 2.20 cm/m LA Vol (A2C):   61.6 ml 35.70 ml/m LA Vol (A4C):   76.9 ml 44.56 ml/m LA Biplane Vol: 75.2 ml 43.58 ml/m  AORTIC VALVE AV Area (Vmax):    0.72 cm AV Area (Vmean):   0.73 cm AV Area (VTI):     0.85 cm AV Vmax:           404.00 cm/s AV Vmean:          299.000 cm/s AV VTI:            1.030 m AV Peak Grad:      65.3 mmHg AV Mean Grad:      41.0 mmHg LVOT Vmax:         84.20 cm/s LVOT Vmean:        63.400 cm/s LVOT VTI:          0.254 m LVOT/AV VTI ratio: 0.25  AORTA Ao Root diam: 3.40 cm Ao Asc diam:  3.10 cm MITRAL VALVE MV Area (PHT): 3.72 cm     SHUNTS MV Decel Time: 204 msec     Systemic VTI:  0.25 m MV E velocity: 134.00 cm/s  Systemic Diam: 2.10 cm MV A velocity: 131.00 cm/s MV E/A ratio:  1.02 Ena Dawley MD Electronically signed by Ena Dawley MD Signature Date/Time: 04/20/2020/11:59:59 AM    Final    CT RENAL STONE STUDY  Result Date: 04/19/2020 CLINICAL DATA:  Severe sepsis.  Rule out hydronephrosis EXAM: CT ABDOMEN AND PELVIS WITHOUT CONTRAST TECHNIQUE: Multidetector CT imaging of the abdomen and pelvis was performed following the standard protocol without IV contrast. COMPARISON:  01/02/2019 FINDINGS: Lower chest: Trace pleural effusions and lower lobe atelectasis. Extensive coronary atherosclerosis. Small hiatal hernia. Hepatobiliary: No focal liver abnormality.No evidence of biliary obstruction or stone. Pancreas: Mild generalized atrophy. Spleen: Unremarkable. Adrenals/Urinary Tract: Negative adrenals. Right hydroureteronephrosis due to a obstructing 11 x 7 mm stone at the pelvic inlet. Irregular 7 mm interpolar calculus in the left kidney. Two small lower pole  calculi on the right. Unremarkable bladder. Stomach/Bowel:  No obstruction. Moderate rectal stool. Vascular/Lymphatic: Atherosclerosis no acute vascular abnormality. No mass or adenopathy. Reproductive:Hysterectomy Other: No ascites or pneumoperitoneum. Musculoskeletal: Lumbar fusion from L2-S1. Severe adjacent segment degeneration at T12-L1 and especially L1-2 where there is retrolisthesis and spinal/foraminal impingement. A spinal stimulator is present. IMPRESSION: 1. Obstructing 11 x 7 mm stone in the right ureter at the level of the pelvic inlet. 2. Bilateral nephrolithiasis. 3. Incidental findings are noted above. Electronically Signed   By: Monte Fantasia M.D.   On: 04/19/2020 06:29   IR NEPHROSTOMY PLACEMENT RIGHT  Result Date: 04/19/2020 INDICATION: Urinary sepsis secondary to right-sided hydronephrosis. EXAM: 1. ULTRASOUND GUIDANCE FOR PUNCTURE OF THE RIGHT RENAL COLLECTING SYSTEM 2. RIGHT PERCUTANEOUS NEPHROSTOMY TUBE PLACEMENT. COMPARISON:  CT abdomen pelvis-earlier same day; 01/02/2019 MEDICATIONS: Rocephin 1 gm IV; The antibiotic was administered in an appropriate time frame prior to skin puncture. ANESTHESIA/SEDATION: Moderate (conscious) sedation was employed during this procedure. A total of Versed 0.5 mg was administered intravenously. Moderate Sedation Time: 14 minutes. The patient's level of consciousness and vital signs were monitored continuously by radiology nursing throughout the procedure under my direct supervision. CONTRAST:  10 mL Isovue 300 - administered into  the renal collecting system FLUOROSCOPY TIME:  1 minute, 36 seconds (79 mGy) COMPLICATIONS: None immediate. PROCEDURE: The procedure, risks, benefits, and alternatives were explained to the patient. Questions regarding the procedure were encouraged and answered. The patient understands and consents to the procedure. A timeout was performed prior to the initiation of the procedure. The right flank region was prepped and draped  in the usual sterile fashion and a sterile drape was applied covering the operative field. A sterile gown and sterile gloves were used for the procedure. Local anesthesia was provided with 1% Lidocaine with epinephrine. Ultrasound was used to localize the right kidney. Under direct ultrasound guidance, a 20 gauge needle was advanced into the renal collecting system. An ultrasound image documentation was performed. Access within the collecting system was confirmed with the efflux of urine followed by limited contrast injection. Over a Nitrex wire, the tract was dilated with an Accustick stent. Next, under intermittent fluoroscopic guidance and over a short Amplatz wire, the track was dilated ultimately allowing placement of a 10-French percutaneous nephrostomy catheter which was advanced to the level of the renal pelvis where the coil was formed and locked. Contrast was injected and several spot fluoroscopic images were obtained in various obliquities. The catheter was secured at the skin with a Prolene retention suture and stat lock device and connected to a gravity bag was placed. Dressings were applied. The patient tolerated procedure well without immediate postprocedural complication. FINDINGS: Ultrasound scanning demonstrates a moderate to severely dilated right collecting system. Under a combination of ultrasound and fluoroscopic guidance, a posterior inferior calix was targeted allowing placement of a 10-French percutaneous nephrostomy catheter with end coiled and locked within the renal pelvis. Contrast injection confirmed appropriate positioning. Limited contrast injection suggest questioned right proximal ureteral stone likely represents a calcified phlebolith as was suggested on more remote abdominal CT performed 01/02/2019. IMPRESSION: 1. Successful ultrasound and fluoroscopic guided placement of a right sided 10 [place] PCN. 2. Limited contrast injection confirms questioned proximal right ureteral/UPJ  stone likely represents a calcified phleboliths as was suggested on more remote abdominal CT 01/02/2019. PLAN: As the etiology of the patient's right-sided urinary obstruction remains indeterminate, the patient may return for right-sided antegrade nephrostogram with potential right-sided ureteral stent placement in 2-3 weeks as indicated. Electronically Signed   By: Sandi Mariscal M.D.   On: 04/19/2020 10:47    Labs:  CBC: Recent Labs    04/19/20 1442 04/20/20 0020 04/21/20 0325 04/22/20 0903  WBC 24.2* 20.3* 23.1* 22.3*  HGB 8.0* 7.8* 7.5* 8.0*  HCT 25.3* 24.9* 24.2* 25.5*  PLT 163 136* 136* 164    COAGS: Recent Labs    04/19/20 0200  INR 1.3*  APTT 29    BMP: Recent Labs    04/19/20 1442 04/20/20 0455 04/21/20 0325 04/22/20 0903  NA 142 143 143 148*  K 3.7 4.0 3.4* 3.7  CL 114* 114* 115* 121*  CO2 17* 17* 20* 21*  GLUCOSE 102* 108* 129* 98  BUN 26* 28* 35* 35*  CALCIUM 6.8* 6.9* 7.2* 7.6*  CREATININE 1.63* 1.47* 1.23* 1.00  GFRNONAA 28* 32* 40* 51*  GFRAA 33* 37* 46* 59*    LIVER FUNCTION TESTS: Recent Labs    04/19/20 0200 04/19/20 1442 04/20/20 0455 04/21/20 0325  BILITOT 0.6 0.5 0.5 0.5  AST 53* 217* 117* 50*  ALT 20 77* 18 8  ALKPHOS 99 109 95 95  PROT 5.4* 5.1* 5.1* 5.4*  ALBUMIN 2.7* 2.3* 2.3* 2.3*    Assessment  and Plan: Patient with history of urosepsis, nephrolithiasis with obstructing right ureteral stone/hydronephrosis, AKI, elevated troponin; status post right percutaneous nephrostomy on 5/11.  Afebrile; WBC 22.3(23.1), hgb 8(7.5), creat nl; PCN urine cx- e coli; cont current tx/PCN- further plans as per urology   Electronically Signed: D. Rowe Robert, PA-C 04/22/2020, 11:31 AM   I spent a total of 15 minutes at the the patient's bedside AND on the patient's hospital floor or unit, greater than 50% of which was counseling/coordinating care for right nephrostomy    Patient ID: [name], female   DOB: 1931/09/07, 86 y.o.    MRN: [id number]

## 2020-04-22 NOTE — TOC Progression Note (Signed)
Transition of Care [facility]) - Progression Note    Patient Details  Name: [name] MRN: [id number] Date of Birth: [birth year]  [facility] of [facility] CM/SW Contact  Ross Ludwig, [place] Phone Number: 04/22/2020, 12:43 PM  Clinical Narrative:     Patient is from [facility].  CSW attempted to contact Anderson Malta, CSW had to leave a message on voice mail, awaiting for call back.   Expected Discharge Plan: Skilled Nursing Facility(from) Barriers to Discharge: Continued Medical Work up  Expected Discharge Plan and Services Expected Discharge Plan: Skilled Nursing Facility(from)       Living arrangements for the past 2 months: [place]                                       Social Determinants of Health (SDOH) Interventions    Readmission Risk Interventions No flowsheet data found.

## 2020-04-22 NOTE — Care Management Important Message (Addendum)
Important Message  Patient Details IM Letter given to [name] SW  Case Manager to present to the Patient Name: [name] MRN: [id number] Date of Birth: 05/10/1931   Medicare Important Message Given:  Yes     [name] 04/22/2020, 10:43 AM

## 2020-04-22 NOTE — TOC Initial Note (Signed)
Transition of Care [facility]) - Initial/Assessment Note    Patient Details  Name: [name] MRN: [id number] Date of Birth: [birth year]  Transition of Care [facility]) CM/SW Contact:    Ross Ludwig, LCSW Phone Number:  04/22/2020, 3:55 PM  Clinical Narrative:                  Patient is a LTC resident at [facility], [place] spoke to [place] in admissions, and she said they would prefer to have patient return on Monday, because they will not have a rehab bed available over the weekend.  CSW informed her that message will be given to the weekend social worker.  SNF admissions worker asked for weekend social worker phone number, so she can follow up with patient over weekend, CSW provided contact information.  CSW completed FL2 and faxed clinicals to facility to review patient's information.  CSW to continue to follow patient's progress throughout discharge planning.  Expected Discharge Plan: Skilled Nursing Facility Barriers to Discharge: Other (comment), Continued Medical Work up   Patient Goals and CMS Choice Patient states their goals for this hospitalization and ongoing recovery are:: To return back to [place] CMS Medicare.gov Compare Post Acute Care list provided to:: Patient Choice offered to / list presented to : Patient  Expected Discharge Plan and Services Expected Discharge Plan: [place] In-house Referral: NA     Living arrangements for the past 2 months: [place]                                      Prior Living Arrangements/Services Living arrangements for the past 2 months: [place] Lives with:: Facility Resident Patient language and need for interpreter reviewed:: Yes Do you feel safe going back to the place where you live?: Yes      Need for Family Participation in Patient Care: No (Comment) Care giver support system in place?: Yes (comment)   Criminal Activity/Legal Involvement  Pertinent to Current Situation/Hospitalization: No - Comment as needed  Activities of Daily Living Home Assistive Devices/Equipment: Blood pressure cuff, Eyeglasses, Grab bars around toilet, Grab bars in shower, Hand-held shower hose, Hospital bed, [employer], Wheelchair, Hearing aid, Scales, Other (Comment)(bilateral hearing aids, right urostomy tube) ADL Screening (condition at time of admission) Patient's cognitive ability adequate to safely complete daily activities?: No(patient very weak and sleepy) Is the patient deaf or have difficulty hearing?: Yes(wears bilateral hearing aids) Does the patient have difficulty seeing, even when wearing glasses/contacts?: No Does the patient have difficulty concentrating, remembering, or making decisions?: Yes Patient able to express need for assistance with ADLs?: Yes Does the patient have difficulty dressing or bathing?: Yes Independently performs ADLs?: No Communication: Independent Dressing (OT): Dependent Is this a change from baseline?: Pre-admission baseline Grooming: Dependent Is this a change from baseline?: Pre-admission baseline Feeding: Dependent Is this a change from baseline?: Pre-admission baseline Bathing: Dependent Is this a change from baseline?: Pre-admission baseline Toileting: Dependent Is this a change from baseline?: Pre-admission baseline In/Out Bed: Dependent Is this a change from baseline?: Pre-admission baseline Walks in Home: Dependent Is this a change from baseline?: Pre-admission baseline Does the patient have difficulty walking or climbing stairs?: Yes(secondary to weakness-patient wheelchair bound) Weakness of Legs: Both Weakness of Arms/Hands: Both  Permission Sought/Granted Permission sought to share information with : Facility Sport and exercise psychologist, Family Supports Permission granted to share information with :  Yes, Verbal Permission Granted  Share Information with NAMEGaelyn, [place] Spouse [phone]   [phone] or [name] Daughter   [phone]  Permission granted to share info w AGENCY: SNF admissions        Emotional Assessment Appearance:: Appears stated age     Orientation: : Oriented to Self, Oriented to Place, Oriented to  Time Alcohol / Substance Use: Not Applicable Psych Involvement: No (comment)  Admission diagnosis:  Severe sepsis ([place]) [A41.9, R65.20] Obstruction of right ureteropelvic junction due to stone [N20.1] Fever, unspecified fever cause [R50.9] Urinary tract infection without hematuria, site unspecified [N39.0] Sepsis, due to unspecified organism, unspecified whether acute organ dysfunction present [facility]) [A41.9] Patient Active Problem List   Diagnosis Date Noted   Septic shock ([place]) 04/19/2020   Obstruction of right ureteropelvic junction due to stone    Acute kidney injury ([place]) 04/18/2020   Fever 04/15/2020   Erosive (osteo)arthritis 03/09/2020   Right hand pain 02/01/2020   Dysuria 10/16/2019   UTI (urinary tract infection) 10/12/2019   Fall 09/21/2019   Confusion 07/24/2019   Depression, psychotic ([place]) 05/08/2019   Nose dryness 04/23/2019   Actinic cheilitis 04/23/2019   Skin lesion 03/05/2019   DOE (dyspnea on exertion) 12/18/2018   Ingrowing nail, right great toe 12/18/2018   Iron deficiency anemia 10/09/2018   Renal lithiasis 09/19/2018   Urinary frequency 09/18/2018   Corns and callus 09/18/2018   Hallucinations, visual 07/21/2018   History of recent fall 07/21/2018   Acute right-sided low back pain 05/22/2018   Unstable gait 04/21/2018   Memory loss 04/21/2018   Abnormal nuclear stress test    Hypothyroidism 02/04/2018   Chronic back pain 02/04/2018   Osteoporosis without current pathological fracture 02/04/2018   Recurrent UTI 02/04/2018   Sjogren's syndrome ([place]) 02/04/2018   Chronic constipation 02/04/2018   RBBB    Aortic stenosis    Excessive cerumen in both ear canals 09/24/2016   Sensorineural hearing loss  (SNHL), bilateral 09/24/2016   Temporomandibular jaw dysfunction 09/24/2016   Acute lower UTI 03/24/2015   Microhematuria 03/24/2015   Follow up 02/15/2015   Other dysphagia 09/03/2014   Unspecified hereditary and idiopathic peripheral neuropathy 09/03/2014   Heart murmur 04/19/2014   GERD (gastroesophageal reflux disease)    Hypercholesterolemia    PVD (peripheral vascular disease) (HCC)    Barrett esophagus    Chronic diastolic CHF (congestive heart failure) ([place])    Parkinson's disease ([place]) 08/26/2013   Swelling of both ankles 11/20/2012   Fracture of multiple pubic rami ([place]) 07/17/2012   Hematoma of hip 07/17/2012   Leukocytosis 07/17/2012   Hypertension    Atherosclerosis of native arteries of the extremities with intermittent claudication 05/22/2012   PCP:  [name], MD Pharmacy:   [place], [place] - [place] [address] [place] [address] Phone: [phone] Fax: [place], [place] - [place] [address] [place] [address] Phone: [phone] Fax: [phone]     Social Determinants of Health (SDOH) Interventions    Readmission Risk Interventions No flowsheet data found.

## 2020-04-23 LAB — CBC
HCT: 23.8 % — ABNORMAL LOW (ref 36.0–46.0)
Hemoglobin: 7.3 g/dL — ABNORMAL LOW (ref 12.0–15.0)
MCH: 30.8 pg (ref 26.0–34.0)
MCHC: 30.7 g/dL (ref 30.0–36.0)
MCV: 100.4 fL — ABNORMAL HIGH (ref 80.0–100.0)
Platelets: 173 10*3/uL (ref 150–400)
RBC: 2.37 MIL/uL — ABNORMAL LOW (ref 3.87–5.11)
RDW: 15.3 % (ref 11.5–15.5)
WBC: 12.5 10*3/uL — ABNORMAL HIGH (ref 4.0–10.5)
nRBC: 0 % (ref 0.0–0.2)

## 2020-04-23 LAB — PREPARE RBC (CROSSMATCH)

## 2020-04-23 LAB — BASIC METABOLIC PANEL
Anion gap: 4 — ABNORMAL LOW (ref 5–15)
BUN: 33 mg/dL — ABNORMAL HIGH (ref 8–23)
CO2: 21 mmol/L — ABNORMAL LOW (ref 22–32)
Calcium: 7.2 mg/dL — ABNORMAL LOW (ref 8.9–10.3)
Chloride: 119 mmol/L — ABNORMAL HIGH (ref 98–111)
Creatinine, Ser: 1.01 mg/dL — ABNORMAL HIGH (ref 0.44–1.00)
GFR calc Af Amer: 58 mL/min — ABNORMAL LOW (ref >60)
GFR calc non Af Amer: 50 mL/min — ABNORMAL LOW (ref >60)
Glucose, Bld: 80 mg/dL (ref 70–99)
Potassium: 3.1 mmol/L — ABNORMAL LOW (ref 3.5–5.1)
Sodium: 144 mmol/L (ref 135–145)

## 2020-04-23 LAB — PHOSPHORUS: Phosphorus: 2.3 mg/dL — ABNORMAL LOW (ref 2.5–4.6)

## 2020-04-23 LAB — MAGNESIUM: Magnesium: 2.5 mg/dL — ABNORMAL HIGH (ref 1.7–2.4)

## 2020-04-23 MED ORDER — POTASSIUM CHLORIDE CRYS ER 20 MEQ PO TBCR
30.0000 meq | EXTENDED_RELEASE_TABLET | ORAL | Status: AC
  Administered 2020-04-23: 30 meq via ORAL
  Filled 2020-04-23: qty 1

## 2020-04-23 MED ORDER — SODIUM CHLORIDE 0.9% IV SOLUTION: Freq: Once | INTRAVENOUS | Status: DC

## 2020-04-23 NOTE — Progress Notes (Signed)
PROGRESS NOTE  [name] [id number] DOB: Sep 23, 1931 DOA: 04/19/2020 PCP: [name], MD   LOS: 4 days   Brief Narrative / Interim history: 86 year old female with history of Parkinson's disease, hypothyroidism, mild dementia, hyperlipidemia, admitted to the ICU on 5/11 with septic shock due to UTI.  She initially required pressors.  She was found to have an obstructive 11 x 7 mm stone in the right ureter at the level of the pelvic inlet.  Urology and IR were consulted and she is status post nephrostomy tube placement on the right side.  Microbiology revealed E. coli in the urine which is intermediate to ampicillin but otherwise sensitive.  She has been maintained on ceftriaxone.  Blood cultures have remained negative so far  Subjective / 24h Interval events: In bed, husband at bedside.  She is feeling a little bit better  Assessment & Plan: Principal Problem Septic shock due to E. coli UTI, obstructive right kidney stone POA-patient was initially admitted to the ICU requiring vasopressors.  She is status post right-sided percutaneous nephrostomy tube.  Clinically she is improving and was transferred to the hospitalist service 5/13. -Urine cultures are showing E. coli intermediate to ampicillin but otherwise sensitive.  Narrowed to Ancef.  Blood cultures still negative.  Active Problems Acute kidney injury-in setting of septic shock, creatinine has now improved and is 1  Hypokalemia/hypomagnesemia-replete and continue to monitor  Anemia-in the setting of shock, hemoglobin overall stable, however he dipped to 7.3 this morning.  Recommend to transfuse a unit of packed red blood cells, patient agreeable  Facial swelling /hand swelling-resolved, unclear etiology, apparently received Lasix on 5/11.  Stable  Mild Transaminitis  -Likely due to shock, LFTs improving  Elevated Troponin  Known RBBB -Suspect demand ischemia in the setting of shock  Hx Sjogrens  -On plaquenil  and prednisone. -hold plaquenil  -Now on oral prednisone and will need to taper  Hx hypothyroidism -continue synthroid    Hx GERD -PPI   Hx HTN, HLD, RBB, dCHF, AS. -continue home ASA, ezetimibe, rosuvastatin  -tele monitoring   Hx depression, visual hallucinations, fibromyalgia, anxiety, parkinsons, RLS -continue home sinemet, quetiapine, sertraline   Aortic stenosis-2D echo done showed normal EF but severe AS.  Outpatient cardiology follow-up  Scheduled Meds: . sodium chloride   Intravenous Once  . aspirin EC  81 mg Oral Daily  . carbidopa-levodopa  2 tablet Oral BID  . Chlorhexidine Gluconate Cloth  6 each Topical Daily  . cholecalciferol  2,000 Units Oral Daily  . docusate sodium  100 mg Oral QHS  . ezetimibe  10 mg Oral QHS  . levothyroxine  50 mcg Oral QAC breakfast  . lubiprostone  24 mcg Oral Daily  . mouth rinse  15 mL Mouth Rinse BID  . multivitamin with minerals  1 tablet Oral Daily  . pantoprazole  40 mg Oral Daily  . pramipexole  1 mg Oral BID  . predniSONE  20 mg Oral Q breakfast  . QUEtiapine  12.5 mg Oral Daily  . QUEtiapine  25 mg Oral QHS  . rosuvastatin  20 mg Oral Daily  . saccharomyces boulardii  250 mg Oral BID  . senna-docusate  1 tablet Oral QHS  . sertraline  75 mg Oral QHS  . sodium chloride flush  10-40 mL Intracatheter Q12H  . sodium chloride flush  5 mL Intracatheter Q8H   Continuous Infusions: . sodium chloride Stopped (04/19/20 1535)  .  ceFAZolin (ANCEF) IV 1 g (04/23/20 1017)  PRN Meds:.acetaminophen **OR** acetaminophen, albuterol, ondansetron **OR** ondansetron (ZOFRAN) IV, polyvinyl alcohol, sodium chloride flush  DVT prophylaxis: SCDs Code Status: Partial code Family Communication: Husband present at bedside  Status is: Inpatient  Remains inpatient appropriate because:Inpatient level of care appropriate due to severity of illness   Dispo: The patient is from: SNF              Anticipated d/c is to: SNF               Anticipated d/c date is: 2 days              Patient currently is not medically stable to d/c.  Consultants:  Urology IR PCCM  Procedures:  2D echo:  IMPRESSIONS  1. Since the last study on 12/30/2019 aortic stenosis is now severe - peak/mean transaortic gradients 66/41 mmHg and dimensionless index 0.21. Referral for a TAVR procedure should be considered.  2. Left ventricular ejection fraction, by estimation, is 60 to 65%. The left ventricle has normal function. The left ventricle has no regional wall motion abnormalities. There is mild concentric left ventricular hypertrophy. Left ventricular diastolic parameters are consistent with Grade I diastolic dysfunction (impaired relaxation). Elevated left atrial pressure.  3. Right ventricular systolic function is normal. The right ventricular size is normal.  4. The mitral valve is normal in structure. Mild mitral valve regurgitation. No evidence of mitral stenosis.  5. The aortic valve is normal in structure. Aortic valve regurgitation is not visualized. Severe aortic valve stenosis. Aortic valve mean gradient measures 41.0 mmHg.  6. The inferior vena cava is normal in size with greater than 50% respiratory variability, suggesting right atrial pressure of 3 mmHg.   Microbiology  Urine cultures - E coli  Antimicrobials: Ceftriaxone     Objective: Vitals:   04/22/20 0907 04/22/20 1254 04/22/20 2044 04/23/20 0423  BP:  133/70 127/68 (!) 147/87  Pulse:  74 65 72  Resp:  (!) 22 18 20   Temp:  97.9 F (36.6 C) 97.8 F (36.6 C) 98.3 F (36.8 C)  TempSrc:  Oral Oral Oral  SpO2: 98% 100% 97% 98%  Weight:      Height:        Intake/Output Summary (Last 24 hours) at 04/23/2020 1304 Last data filed at 04/23/2020 1112 Gross per 24 hour  Intake 300 ml  Output 925 ml  Net -625 ml   Filed Weights   04/19/20 0157 04/19/20 1730  Weight: 65.3 kg 71.3 kg    Examination:  Constitutional: No distress Eyes: No icterus ENMT: Moist  mucous membranes Neck: normal, supple Respiratory: Clear bilaterally, no wheezing Cardiovascular: Regular rate and rhythm, 3/6 SEM, no edema Abdomen: Soft, nontender, nondistended, positive bowel sounds Musculoskeletal: no clubbing / cyanosis.  Skin: No rashes seen Neurologic: No focal deficits  Data Reviewed: I have independently reviewed following labs and imaging studies   CBC: Recent Labs  Lab 04/19/20 0200 04/19/20 0200 04/19/20 1442 04/20/20 0020 04/21/20 0325 04/22/20 0903 04/23/20 0350  WBC 1.0*   < > 24.2* 20.3* 23.1* 22.3* 12.5*  NEUTROABS 0.7*  --   --   --   --   --   --   HGB 9.1*   < > 8.0* 7.8* 7.5* 8.0* 7.3*  HCT 29.5*   < > 25.3* 24.9* 24.2* 25.5* 23.8*  MCV 101.4*   < > 99.6 99.2 100.0 100.0 100.4*  PLT 131*   < > 163 136* 136* 164 173   < > = values  in this interval not displayed.   Basic Metabolic Panel: Recent Labs  Lab 04/19/20 1442 04/20/20 0455 04/21/20 0325 04/22/20 0605 04/22/20 0903 04/23/20 0350  NA 142 143 143  --  148* 144  K 3.7 4.0 3.4*  --  3.7 3.1*  CL 114* 114* 115*  --  121* 119*  CO2 17* 17* 20*  --  21* 21*  GLUCOSE 102* 108* 129*  --  98 80  BUN 26* 28* 35*  --  35* 33*  CREATININE 1.63* 1.47* 1.23*  --  1.00 1.01*  CALCIUM 6.8* 6.9* 7.2*  --  7.6* 7.2*  MG  --  1.5* 3.0* 2.6*  --  2.5*  PHOS  --  3.6 3.2 2.6  --  2.3*   Liver Function Tests: Recent Labs  Lab 04/19/20 0200 04/19/20 1442 04/20/20 0455 04/21/20 0325  AST 53* 217* 117* 50*  ALT 20 77* 18 8  ALKPHOS 99 109 95 95  BILITOT 0.6 0.5 0.5 0.5  PROT 5.4* 5.1* 5.1* 5.4*  ALBUMIN 2.7* 2.3* 2.3* 2.3*   Coagulation Profile: Recent Labs  Lab 04/19/20 0200  INR 1.3*   HbA1C: No results for input(s): HGBA1C in the last 72 hours. CBG: Recent Labs  Lab 04/19/20 0200  GLUCAP 94    Recent Results (from the past 240 hour(s))  Blood Culture (routine x 2)     Status: None (Preliminary result)   Collection Time: 04/19/20  2:05 AM   Specimen: BLOOD  Result  Value Ref Range Status   Specimen Description   Final    BLOOD RIGHT ANTECUBITAL Performed at [facility] [address] [place], [place] [address]    Special Requests   Final    BOTTLES DRAWN AEROBIC AND ANAEROBIC Blood Culture adequate volume Performed at [facility] [address], [place], [place] [address]    Culture   Final    NO GROWTH 4 DAYS Performed at [facility], [place] [address] [place], [place] [address]    Report Status PENDING  Incomplete  Blood Culture (routine x 2)     Status: None (Preliminary result)   Collection Time: 04/19/20  2:14 AM   Specimen: BLOOD  Result Value Ref Range Status   Specimen Description   Final    BLOOD LEFT ANTECUBITAL Performed at [facility] [address] [place], [place] [address]    Special Requests   Final    BOTTLES DRAWN AEROBIC AND ANAEROBIC Blood Culture adequate volume Performed at [facility] [address] [place], [place] [address]    Culture   Final    NO GROWTH 4 DAYS Performed at [facility], [place] [address], [place], [place] [address]    Report Status PENDING  Incomplete  Urine culture     Status: None   Collection Time: 04/19/20  4:11 AM   Specimen: In/Out Cath Urine  Result Value Ref Range Status   Specimen Description   Final    IN/OUT CATH URINE Performed at [facility] [address], [place], [place] [address]    Special Requests   Final    NONE Performed at [facility], [place] [address] [place], [place] [address]    Culture   Final    NO GROWTH Performed at [facility], [place] [address] [place], [place] [address]    Report Status 04/20/2020 FINAL  Final  SARS Coronavirus 2  by RT PCR (hospital order, performed in [facility] hospital lab) Nasopharyngeal Nasopharyngeal Swab     Status: None   Collection Time: 04/19/20  5:43 AM   Specimen: Nasopharyngeal Swab    Result Value Ref Range Status   SARS Coronavirus 2 NEGATIVE NEGATIVE Final    Comment: (NOTE) SARS-CoV-2 target nucleic acids are NOT DETECTED. The SARS-CoV-2 RNA is generally detectable in upper and lower respiratory specimens during the acute phase of infection. The lowest concentration of SARS-CoV-2 viral copies this assay can detect is 250 copies / mL. A negative result does not preclude SARS-CoV-2 infection and should not be used as the sole basis for treatment or other patient management decisions.  A negative result may occur with improper specimen collection / handling, submission of specimen other than nasopharyngeal swab, presence of viral mutation(s) within the areas targeted by this assay, and inadequate number of viral copies (<250 copies / mL). A negative result must be combined with clinical observations, patient history, and epidemiological information. Fact Sheet for Patients:   StrictlyIdeas.no Fact Sheet for Healthcare Providers: BankingDealers.co.za This test is not yet approved or cleared  by the [place] FDA and has been authorized for detection and/or diagnosis of SARS-CoV-2 by FDA under an Emergency Use Authorization (EUA).  This EUA will remain in effect (meaning this test can be used) for the duration of the COVID-19 declaration under Section 564(b)(1) of the Act, 21 U.S.C. section 360bbb-3(b)(1), unless the authorization is terminated or revoked sooner. Performed at [facility], [place] [address] [place], [place] [address]   Urine culture     Status: Abnormal   Collection Time: 04/19/20  9:49 AM   Specimen: Kidney; Urine  Result Value Ref Range Status   Specimen Description   Final    KIDNEY RIGHT Performed at [facility] [address] [place], [place] [address]    Special Requests   Final    Normal Performed at [facility], [place]  [address], [place], [place] [address]    Culture >=100,000 COLONIES/mL ESCHERICHIA COLI (A)  Final   Report Status 04/21/2020 FINAL  Final   Organism ID, Bacteria ESCHERICHIA COLI (A)  Final      Susceptibility   Escherichia coli - MIC*    AMPICILLIN 16 INTERMEDIATE Intermediate     CEFAZOLIN <=4 SENSITIVE Sensitive     CEFEPIME <=1 SENSITIVE Sensitive     CEFTAZIDIME <=1 SENSITIVE Sensitive     CEFTRIAXONE 2 SENSITIVE Sensitive     CIPROFLOXACIN 1 SENSITIVE Sensitive     GENTAMICIN <=1 SENSITIVE Sensitive     IMIPENEM <=0.25 SENSITIVE Sensitive     TRIMETH/SULFA <=20 SENSITIVE Sensitive     AMPICILLIN/SULBACTAM <=2 SENSITIVE Sensitive     * >=100,000 COLONIES/mL ESCHERICHIA COLI  MRSA PCR Screening     Status: None   Collection Time: 04/19/20  5:16 PM   Specimen: Nasal Mucosa; Nasopharyngeal  Result Value Ref Range Status   MRSA by PCR NEGATIVE NEGATIVE Final    Comment:        The GeneXpert MRSA Assay (FDA approved for NASAL specimens only), is one component of a comprehensive MRSA colonization surveillance program. It is not intended to diagnose MRSA infection nor to guide or monitor treatment for MRSA infections. Performed at [facility], [place] [address], [place], [place] [address]      Radiology Studies: No results found.  Marzetta Board, MD, PhD [facility]  Between 7 am - 7  pm I am available, please contact me via Amion or Securechat  Between 7 pm - 7 am I am not available, please contact night coverage MD/APP via Amion

## 2020-04-24 LAB — CBC
HCT: 27.1 % — ABNORMAL LOW (ref 36.0–46.0)
Hemoglobin: 8.8 g/dL — ABNORMAL LOW (ref 12.0–15.0)
MCH: 31.7 pg (ref 26.0–34.0)
MCHC: 32.5 g/dL (ref 30.0–36.0)
MCV: 97.5 fL (ref 80.0–100.0)
Platelets: 194 10*3/uL (ref 150–400)
RBC: 2.78 MIL/uL — ABNORMAL LOW (ref 3.87–5.11)
RDW: 14.9 % (ref 11.5–15.5)
WBC: 14.5 10*3/uL — ABNORMAL HIGH (ref 4.0–10.5)
nRBC: 0 % (ref 0.0–0.2)

## 2020-04-24 LAB — CULTURE, BLOOD (ROUTINE X 2)
Culture: NO GROWTH
Culture: NO GROWTH
Special Requests: ADEQUATE
Special Requests: ADEQUATE

## 2020-04-24 LAB — COMPREHENSIVE METABOLIC PANEL
ALT: 7 U/L (ref 0–44)
AST: 32 U/L (ref 15–41)
Albumin: 2.3 g/dL — ABNORMAL LOW (ref 3.5–5.0)
Alkaline Phosphatase: 77 U/L (ref 38–126)
Anion gap: 8 (ref 5–15)
BUN: 26 mg/dL — ABNORMAL HIGH (ref 8–23)
CO2: 22 mmol/L (ref 22–32)
Calcium: 7 mg/dL — ABNORMAL LOW (ref 8.9–10.3)
Chloride: 115 mmol/L — ABNORMAL HIGH (ref 98–111)
Creatinine, Ser: 0.78 mg/dL (ref 0.44–1.00)
GFR calc Af Amer: >60 mL/min (ref >60)
GFR calc non Af Amer: >60 mL/min (ref >60)
Glucose, Bld: 85 mg/dL (ref 70–99)
Potassium: 3 mmol/L — ABNORMAL LOW (ref 3.5–5.1)
Sodium: 145 mmol/L (ref 135–145)
Total Bilirubin: 0.6 mg/dL (ref 0.3–1.2)
Total Protein: 4.7 g/dL — ABNORMAL LOW (ref 6.5–8.1)

## 2020-04-24 LAB — TYPE AND SCREEN
ABO/RH(D): O POS
Antibody Screen: NEGATIVE
Unit division: 0

## 2020-04-24 LAB — BPAM RBC
Blood Product Expiration Date: 202106132359
ISSUE DATE / TIME: 202105151316
Unit Type and Rh: 5100

## 2020-04-24 MED ORDER — SENNOSIDES-DOCUSATE SODIUM 8.6-50 MG PO TABS
1.0000 | ORAL_TABLET | Freq: Two times a day (BID) | ORAL | Status: DC
  Administered 2020-04-24 – 2020-04-25 (×3): 1 via ORAL
  Filled 2020-04-24 (×3): qty 1

## 2020-04-24 MED ORDER — MILK AND MOLASSES ENEMA
1.0000 | Freq: Once | RECTAL | Status: AC
  Administered 2020-04-24: 240 mL via RECTAL
  Filled 2020-04-24: qty 240

## 2020-04-24 MED ORDER — LIP MEDEX EX OINT
TOPICAL_OINTMENT | CUTANEOUS | Status: DC | PRN
  Filled 2020-04-24: qty 7

## 2020-04-24 MED ORDER — POTASSIUM CHLORIDE CRYS ER 20 MEQ PO TBCR
40.0000 meq | EXTENDED_RELEASE_TABLET | ORAL | Status: AC
  Administered 2020-04-24 (×2): 40 meq via ORAL
  Filled 2020-04-24 (×2): qty 2

## 2020-04-24 MED ORDER — POLYETHYLENE GLYCOL 3350 17 G PO PACK
17.0000 g | PACK | Freq: Every day | ORAL | Status: DC
  Administered 2020-04-24 – 2020-04-25 (×2): 17 g via ORAL
  Filled 2020-04-24 (×2): qty 1

## 2020-04-24 NOTE — Progress Notes (Signed)
PROGRESS NOTE  [name] [id number] DOB: 1931-04-13 DOA: 04/19/2020 PCP: [name], MD   LOS: 5 days   Brief Narrative / Interim history: 86 year old female with history of Parkinson's disease, hypothyroidism, mild dementia, hyperlipidemia, admitted to the ICU on 5/11 with septic shock due to UTI.  She initially required pressors.  She was found to have an obstructive 11 x 7 mm stone in the right ureter at the level of the pelvic inlet.  Urology and IR were consulted and she is status post nephrostomy tube placement on the right side.  Microbiology revealed E. coli in the urine which is intermediate to ampicillin but otherwise sensitive.  She has been maintained on ceftriaxone.  Blood cultures have remained negative so far  Subjective / 24h Interval events: Complains of mild abdominal discomfort in the right lower quadrant.  No nausea or vomiting.  Reports constipation has had a bowel movement in several days.  No fever or chills.  Assessment & Plan: Principal Problem Septic shock due to E. coli UTI, obstructive right kidney stone POA-patient was initially admitted to the ICU requiring vasopressors.  She is status post right-sided percutaneous nephrostomy tube.  Clinically she is improving and was transferred to the hospitalist service 5/13. -Urine cultures are showing E. coli intermediate to ampicillin but otherwise sensitive.  Narrowed to Ancef.  Blood cultures remain negative today  Active Problems Acute kidney injury-in setting of septic shock, creatinine has now normalized  Hypokalemia/hypomagnesemia-replete again today  Anemia-in the setting of shock, hemoglobin overall stable, received a unit of packed red blood cells on 5/15 for hemoglobin of 7.3, improved appropriately to 8.8.  No bleeding  Facial swelling /hand swelling-resolved, unclear etiology, apparently received Lasix on 5/11.  No recurrence  Mild Transaminitis  -Likely due to shock, LFTs have  normalized  Elevated Troponin  Known RBBB -Suspect demand ischemia in the setting of shock  Hx Sjogrens  -On plaquenil and prednisone. -hold plaquenil  -Now on oral prednisone and will need to taper  Hx hypothyroidism -continue synthroid    Hx GERD -PPI   Hx HTN, HLD, RBB, dCHF, AS. -continue home ASA, ezetimibe, rosuvastatin  -tele monitoring   Hx depression, visual hallucinations, fibromyalgia, anxiety, parkinsons, RLS -continue home sinemet, quetiapine, sertraline   Aortic stenosis-2D echo done showed normal EF but severe AS.  Outpatient cardiology follow-up  Scheduled Meds: . sodium chloride   Intravenous Once  . aspirin EC  81 mg Oral Daily  . carbidopa-levodopa  2 tablet Oral BID  . Chlorhexidine Gluconate Cloth  6 each Topical Daily  . cholecalciferol  2,000 Units Oral Daily  . docusate sodium  100 mg Oral QHS  . ezetimibe  10 mg Oral QHS  . levothyroxine  50 mcg Oral QAC breakfast  . lubiprostone  24 mcg Oral Daily  . mouth rinse  15 mL Mouth Rinse BID  . multivitamin with minerals  1 tablet Oral Daily  . pantoprazole  40 mg Oral Daily  . polyethylene glycol  17 g Oral Daily  . potassium chloride  40 mEq Oral Q3H  . pramipexole  1 mg Oral BID  . predniSONE  20 mg Oral Q breakfast  . QUEtiapine  12.5 mg Oral Daily  . QUEtiapine  25 mg Oral QHS  . rosuvastatin  20 mg Oral Daily  . saccharomyces boulardii  250 mg Oral BID  . senna-docusate  1 tablet Oral BID  . sertraline  75 mg Oral QHS  . sodium chloride flush  10-40 mL Intracatheter Q12H  . sodium chloride flush  5 mL Intracatheter Q8H   Continuous Infusions: . sodium chloride Stopped (04/19/20 1535)  .  ceFAZolin (ANCEF) IV 1 g (04/24/20 0123)   PRN Meds:.acetaminophen **OR** acetaminophen, albuterol, lip balm, ondansetron **OR** ondansetron (ZOFRAN) IV, polyvinyl alcohol, sodium chloride flush  DVT prophylaxis: SCDs Code Status: Partial code Family Communication: Husband present at  bedside  Status is: Inpatient  Remains inpatient appropriate because:Inpatient level of care appropriate due to severity of illness.  Awaiting bed availability on Monday   Dispo: The patient is from: SNF              Anticipated d/c is to: SNF              Anticipated d/c date is: 1 day              Patient currently is not medically stable to d/c.  Consultants:  Urology IR PCCM  Procedures:  2D echo:  IMPRESSIONS  1. Since the last study on 12/30/2019 aortic stenosis is now severe - peak/mean transaortic gradients 66/41 mmHg and dimensionless index 0.21. Referral for a TAVR procedure should be considered.  2. Left ventricular ejection fraction, by estimation, is 60 to 65%. The left ventricle has normal function. The left ventricle has no regional wall motion abnormalities. There is mild concentric left ventricular hypertrophy. Left ventricular diastolic parameters are consistent with Grade I diastolic dysfunction (impaired relaxation). Elevated left atrial pressure.  3. Right ventricular systolic function is normal. The right ventricular size is normal.  4. The mitral valve is normal in structure. Mild mitral valve regurgitation. No evidence of mitral stenosis.  5. The aortic valve is normal in structure. Aortic valve regurgitation is not visualized. Severe aortic valve stenosis. Aortic valve mean gradient measures 41.0 mmHg.  6. The inferior vena cava is normal in size with greater than 50% respiratory variability, suggesting right atrial pressure of 3 mmHg.   Microbiology  Urine cultures - E coli  Antimicrobials: Ceftriaxone     Objective: Vitals:   04/23/20 0423 04/23/20 1324 04/23/20 1339 04/23/20 2054  BP: (!) 147/87 (!) 141/63 133/63 130/60  Pulse: 72 77 77 76  Resp: 20  16   Temp: 98.3 F (36.8 C) 98 F (36.7 C) 98.2 F (36.8 C) 98.9 F (37.2 C)  TempSrc: Oral Oral Oral Oral  SpO2: 98% 98% 98% 100%  Weight:      Height:        Intake/Output Summary (Last  24 hours) at 04/24/2020 0942 Last data filed at 04/24/2020 0655 Gross per 24 hour  Intake 1710 ml  Output 2250 ml  Net -540 ml   Filed Weights   04/19/20 0157 04/19/20 1730  Weight: 65.3 kg 71.3 kg    Examination:  Constitutional: No distress, in bed Eyes: No scleral icterus ENMT: Moist mucous membranes Neck: normal, supple Respiratory: Clear bilaterally, no wheezing heard Cardiovascular: Regular rate and rhythm, 3/6 SEM, no peripheral edema Abdomen: Soft, nontender, nondistended, positive bowel sounds Musculoskeletal: no clubbing / cyanosis.  Skin: No rashes appreciated.  Nephrostomy drain in place Neurologic: Nonfocal, equal strength  Data Reviewed: I have independently reviewed following labs and imaging studies   CBC: Recent Labs  Lab 04/19/20 0200 04/19/20 1442 04/20/20 0020 04/21/20 0325 04/22/20 0903 04/23/20 0350 04/24/20 0423  WBC 1.0*   < > 20.3* 23.1* 22.3* 12.5* 14.5*  NEUTROABS 0.7*  --   --   --   --   --   --  HGB 9.1*   < > 7.8* 7.5* 8.0* 7.3* 8.8*  HCT 29.5*   < > 24.9* 24.2* 25.5* 23.8* 27.1*  MCV 101.4*   < > 99.2 100.0 100.0 100.4* 97.5  PLT 131*   < > 136* 136* 164 173 194   < > = values in this interval not displayed.   Basic Metabolic Panel: Recent Labs  Lab 04/20/20 0455 04/21/20 0325 04/22/20 0605 04/22/20 0903 04/23/20 0350 04/24/20 0423  NA 143 143  --  148* 144 145  K 4.0 3.4*  --  3.7 3.1* 3.0*  CL 114* 115*  --  121* 119* 115*  CO2 17* 20*  --  21* 21* 22  GLUCOSE 108* 129*  --  98 80 85  BUN 28* 35*  --  35* 33* 26*  CREATININE 1.47* 1.23*  --  1.00 1.01* 0.78  CALCIUM 6.9* 7.2*  --  7.6* 7.2* 7.0*  MG 1.5* 3.0* 2.6*  --  2.5*  --   PHOS 3.6 3.2 2.6  --  2.3*  --    Liver Function Tests: Recent Labs  Lab 04/19/20 0200 04/19/20 1442 04/20/20 0455 04/21/20 0325 04/24/20 0423  AST 53* 217* 117* 50* 32  ALT 20 77* 18 8 7   ALKPHOS 99 109 95 95 77  BILITOT 0.6 0.5 0.5 0.5 0.6  PROT 5.4* 5.1* 5.1* 5.4* 4.7*  ALBUMIN  2.7* 2.3* 2.3* 2.3* 2.3*   Coagulation Profile: Recent Labs  Lab 04/19/20 0200  INR 1.3*   HbA1C: No results for input(s): HGBA1C in the last 72 hours. CBG: Recent Labs  Lab 04/19/20 0200  GLUCAP 94    Recent Results (from the past 240 hour(s))  Blood Culture (routine x 2)     Status: None   Collection Time: 04/19/20  2:05 AM   Specimen: BLOOD  Result Value Ref Range Status   Specimen Description   Final    BLOOD RIGHT ANTECUBITAL Performed at [facility] [address], [place], [place] [address]    Special Requests   Final    BOTTLES DRAWN AEROBIC AND ANAEROBIC Blood Culture adequate volume Performed at [facility] [address], [place], [place] [address]    Culture   Final    NO GROWTH 5 DAYS Performed at [facility], [place] [address], [place], [place] [address]    Report Status 04/24/2020 FINAL  Final  Blood Culture (routine x 2)     Status: None   Collection Time: 04/19/20  2:14 AM   Specimen: BLOOD  Result Value Ref Range Status   Specimen Description   Final    BLOOD LEFT ANTECUBITAL Performed at [facility] [address] [place], [place] [address]    Special Requests   Final    BOTTLES DRAWN AEROBIC AND ANAEROBIC Blood Culture adequate volume Performed at [facility] [address] [place], [place] [address]    Culture   Final    NO GROWTH 5 DAYS Performed at [facility], [place] [address], [place], [place] [address]    Report Status 04/24/2020 FINAL  Final  Urine culture     Status: None   Collection Time: 04/19/20  4:11 AM   Specimen: In/Out Cath Urine  Result Value Ref Range Status   Specimen Description   Final    IN/OUT CATH URINE Performed at [facility] [address] [place], [place] [address]    Special  Requests   Final    NONE Performed at [facility], [place] [address] [place], [place] [address]     Culture   Final    NO GROWTH Performed at [facility], [place] [address], [place], [place] [address]    Report Status 04/20/2020 FINAL  Final  SARS Coronavirus 2 by RT PCR (hospital order, performed in [facility] hospital lab) Nasopharyngeal Nasopharyngeal Swab     Status: None   Collection Time: 04/19/20  5:43 AM   Specimen: Nasopharyngeal Swab  Result Value Ref Range Status   SARS Coronavirus 2 NEGATIVE NEGATIVE Final    Comment: (NOTE) SARS-CoV-2 target nucleic acids are NOT DETECTED. The SARS-CoV-2 RNA is generally detectable in upper and lower respiratory specimens during the acute phase of infection. The lowest concentration of SARS-CoV-2 viral copies this assay can detect is 250 copies / mL. A negative result does not preclude SARS-CoV-2 infection and should not be used as the sole basis for treatment or other patient management decisions.  A negative result may occur with improper specimen collection / handling, submission of specimen other than nasopharyngeal swab, presence of viral mutation(s) within the areas targeted by this assay, and inadequate number of viral copies (<250 copies / mL). A negative result must be combined with clinical observations, patient history, and epidemiological information. Fact Sheet for Patients:   StrictlyIdeas.no Fact Sheet for Healthcare Providers: BankingDealers.co.za This test is not yet approved or cleared  by the [place] FDA and has been authorized for detection and/or diagnosis of SARS-CoV-2 by FDA under an Emergency Use Authorization (EUA).  This EUA will remain in effect (meaning this test can be used) for the duration of the COVID-19 declaration under Section 564(b)(1) of the Act, 21 U.S.C. section 360bbb-3(b)(1), unless the authorization is terminated or revoked sooner. Performed at [facility], [place] [address] [place], [place] [address]   Urine culture      Status: Abnormal   Collection Time: 04/19/20  9:49 AM   Specimen: Kidney; Urine  Result Value Ref Range Status   Specimen Description   Final    KIDNEY RIGHT Performed at [facility] [address], [place], [place] [address]    Special Requests   Final    Normal Performed at [facility], [place] [address], [place], [place] [address]    Culture >=100,000 COLONIES/mL ESCHERICHIA COLI (A)  Final   Report Status 04/21/2020 FINAL  Final   Organism ID, Bacteria ESCHERICHIA COLI (A)  Final      Susceptibility   Escherichia coli - MIC*    AMPICILLIN 16 INTERMEDIATE Intermediate     CEFAZOLIN <=4 SENSITIVE Sensitive     CEFEPIME <=1 SENSITIVE Sensitive     CEFTAZIDIME <=1 SENSITIVE Sensitive     CEFTRIAXONE 2 SENSITIVE Sensitive     CIPROFLOXACIN 1 SENSITIVE Sensitive     GENTAMICIN <=1 SENSITIVE Sensitive     IMIPENEM <=0.25 SENSITIVE Sensitive     TRIMETH/SULFA <=20 SENSITIVE Sensitive     AMPICILLIN/SULBACTAM <=2 SENSITIVE Sensitive     * >=100,000 COLONIES/mL ESCHERICHIA COLI  MRSA PCR Screening     Status: None   Collection Time: 04/19/20  5:16 PM   Specimen: Nasal Mucosa; Nasopharyngeal  Result Value Ref Range Status   MRSA by PCR NEGATIVE NEGATIVE Final    Comment:        The GeneXpert MRSA Assay (FDA approved for NASAL specimens only), is one component of a  comprehensive MRSA colonization surveillance program. It is not intended to diagnose MRSA infection nor to guide or monitor treatment for MRSA infections. Performed at [facility], [place] [address], [place], [place] [address]      Radiology Studies: No results found.  Marzetta Board, MD, PhD [facility]  Between 7 am - 7 pm I am available, please contact me via Amion or Securechat  Between 7 pm - 7 am I am not available, please contact night coverage MD/APP via Amion

## 2020-04-25 ENCOUNTER — Non-Acute Institutional Stay (SKILLED_NURSING_FACILITY): Payer: Medicare Other | Admitting: Nurse Practitioner

## 2020-04-25 DIAGNOSIS — D72829 Elevated white blood cell count, unspecified: Secondary | ICD-10-CM

## 2020-04-25 DIAGNOSIS — I5032 Chronic diastolic (congestive) heart failure: Secondary | ICD-10-CM | POA: Diagnosis not present

## 2020-04-25 DIAGNOSIS — K5909 Other constipation: Secondary | ICD-10-CM | POA: Diagnosis not present

## 2020-04-25 DIAGNOSIS — K219 Gastro-esophageal reflux disease without esophagitis: Secondary | ICD-10-CM | POA: Diagnosis not present

## 2020-04-25 DIAGNOSIS — I350 Nonrheumatic aortic (valve) stenosis: Secondary | ICD-10-CM

## 2020-04-25 DIAGNOSIS — D500 Iron deficiency anemia secondary to blood loss (chronic): Secondary | ICD-10-CM

## 2020-04-25 DIAGNOSIS — M3500 Sicca syndrome, unspecified: Secondary | ICD-10-CM

## 2020-04-25 DIAGNOSIS — N179 Acute kidney failure, unspecified: Secondary | ICD-10-CM | POA: Diagnosis not present

## 2020-04-25 DIAGNOSIS — E02 Subclinical iodine-deficiency hypothyroidism: Secondary | ICD-10-CM | POA: Diagnosis not present

## 2020-04-25 DIAGNOSIS — M154 Erosive (osteo)arthritis: Secondary | ICD-10-CM | POA: Diagnosis not present

## 2020-04-25 DIAGNOSIS — N390 Urinary tract infection, site not specified: Secondary | ICD-10-CM

## 2020-04-25 DIAGNOSIS — G20 Parkinson's disease: Secondary | ICD-10-CM

## 2020-04-25 DIAGNOSIS — R441 Visual hallucinations: Secondary | ICD-10-CM

## 2020-04-25 DIAGNOSIS — I10 Essential (primary) hypertension: Secondary | ICD-10-CM

## 2020-04-25 DIAGNOSIS — F323 Major depressive disorder, single episode, severe with psychotic features: Secondary | ICD-10-CM | POA: Diagnosis not present

## 2020-04-25 DIAGNOSIS — N201 Calculus of ureter: Secondary | ICD-10-CM

## 2020-04-25 LAB — BASIC METABOLIC PANEL
Anion gap: 5 (ref 5–15)
BUN: 16 mg/dL (ref 8–23)
CO2: 26 mmol/L (ref 22–32)
Calcium: 7.6 mg/dL — ABNORMAL LOW (ref 8.9–10.3)
Chloride: 115 mmol/L — ABNORMAL HIGH (ref 98–111)
Creatinine, Ser: 0.81 mg/dL (ref 0.44–1.00)
GFR calc Af Amer: >60 mL/min (ref >60)
GFR calc non Af Amer: >60 mL/min (ref >60)
Glucose, Bld: 88 mg/dL (ref 70–99)
Potassium: 3.8 mmol/L (ref 3.5–5.1)
Sodium: 146 mmol/L — ABNORMAL HIGH (ref 135–145)

## 2020-04-25 LAB — CBC
HCT: 29.7 % — ABNORMAL LOW (ref 36.0–46.0)
Hemoglobin: 9.4 g/dL — ABNORMAL LOW (ref 12.0–15.0)
MCH: 30.6 pg (ref 26.0–34.0)
MCHC: 31.6 g/dL (ref 30.0–36.0)
MCV: 96.7 fL (ref 80.0–100.0)
Platelets: 226 10*3/uL (ref 150–400)
RBC: 3.07 MIL/uL — ABNORMAL LOW (ref 3.87–5.11)
RDW: 14.8 % (ref 11.5–15.5)
WBC: 13.8 10*3/uL — ABNORMAL HIGH (ref 4.0–10.5)
nRBC: 0 % (ref 0.0–0.2)

## 2020-04-25 LAB — SARS CORONAVIRUS 2 BY RT PCR (HOSPITAL ORDER, PERFORMED IN ~~LOC~~ HOSPITAL LAB): SARS Coronavirus 2: NEGATIVE

## 2020-04-25 MED ORDER — PREDNISONE 20 MG PO TABS: 20.0000 mg | ORAL_TABLET | Freq: Every day | ORAL | 0 refills | Status: DC

## 2020-04-25 MED ORDER — CEPHALEXIN 500 MG PO CAPS: 500.0000 mg | ORAL_CAPSULE | Freq: Four times a day (QID) | ORAL | 0 refills | Status: AC

## 2020-04-25 MED ORDER — PREDNISONE 1 MG PO TABS: 3.0000 mg | ORAL_TABLET | Freq: Every day | ORAL | 0 refills | Status: DC

## 2020-04-25 NOTE — Care Management Important Message (Signed)
Important Message  Patient Details IM Letter given to Gabriel Earing RN Case Manager to present to the Patient Name: ALLEAN SCHWIEBERT MRN: [id number] Date of Birth: 12/20/1930   Medicare Important Message Given:  Yes     [name] 04/25/2020, 11:16 AM

## 2020-04-25 NOTE — Progress Notes (Signed)
Location:   [facility] Room Number: 45 Place of Service:  SNF (31) Provider: Lennie Odor Caryn Gienger NP  [name], MD  Patient Care Team: [name], MD as PCP - General (Internal Medicine) [name], MD as PCP - Cardiology (Cardiology) [name], MD (Neurosurgery) [name], MD (Neurology) [name], MD (Inactive) as Consulting Physician (Gastroenterology) [name], MD as Consulting Physician (Urology) Denya Buckingham X, NP as Nurse Practitioner (Internal Medicine)  Extended Emergency Contact Information Primary Emergency Contact: [facility] Address: [address]          [place], [place] [address] [name] of [place] Phone: [phone] Mobile Phone: [phone] Relation: Spouse Secondary Emergency Contact: [name] Mobile Phone: [phone] Relation: Daughter Preferred language: [name] Interpreter needed? No  Code Status: DNR Goals of care: Advanced Directive information Advanced Directives 04/19/2020  Does Patient Have a Medical Advance Directive? Yes  Type of Advance Directive Living will  Does patient want to make changes to medical advance directive? No - Patient declined  Copy of [place] in Chart? -  Would patient like information on creating a medical advance directive? -  Pre-existing out of facility DNR order (yellow form or pink MOST form) -     Chief Complaint  Patient presents with  . Acute Visit    Medication review    HPI:  Pt is a 86 y.o. female seen today for an acute visit for medication review.   The patient was hospitalized 04/19/20-04/25/20, urosepsis, treated with Abncef then dc on Keflex 500mg  qid x 8 days in SNF [facility],  obstructive right kidney stone, s/p right sided percutaneous nephrostomy tube, f/u Urology  AKI, resolved after IVF and adequate oral intake.   Anemia, s/p one unit PRBC transfusion, Hgb improved from 7.3 to 9.2 04/26/20, on  Fe  Prior to hospitalization the patient was treated for UTI with empirical Levaquin in setting leukocytosis then switched to Nitrofurantoin when urine culture is available.   Prednisone 3mg  +20mg  qd x 5 days, then 20mg  qd for Hx of Sjogren's, erosive arthritis, on Prednisone, Plaquenil, Tylenol 500mg  bid.  Edema/CHF,  HTN, on Furosemide. Hypothyroidism, on Levothyroxine 40mcg qd. GERD, on Prevacid 30mg  qd. Depression/hallucination, stable, on Quetiapine, Sertraline 75mg  qd. Parkinson, stable, on Sinemet 25/100mg  II bid, Mirapex 1mg  bid.  Past Medical History:  Diagnosis Date  . Anemia   . Anxiety   . Aortic stenosis    moderate by echo 2021   . Arthritis   . Broken arm    right   . Chronic diastolic CHF (congestive heart failure) ([place])   . Family history of adverse reaction to anesthesia    pt. states sister vomits  . Fibromyalgia   . Fracture of thumb 09/01/2015   right  . Fracture, foot 09/01/2015   right  . GERD (gastroesophageal reflux disease)   . H/O hiatal hernia   . History of bronchitis   . History of kidney stones   . Hypercholesterolemia   . Hypertension    dr t turner  . Hypothyroidism   . Neuropathy   . Osteoporosis   . Parkinson disease ([place])   . PVD (peripheral vascular disease) (HCC)    99% stenosis of left anteiror tibial artery, mod stenosis of left distal SFA and popliteal artery followed by [name]  . RBBB    noted on EKG 2018  . Restless leg   . Shortness of breath    with exertion -  chronic   . Sjogren's disease ([place])   . SOB (shortness of breath)    chronic due to diastolic dysfunction, deconditioning, obesity  . Tremor    Past Surgical History:  Procedure Laterality Date  . ABDOMINAL HYSTERECTOMY    . BACK SURGERY     4 back surgeries,   lumbar fusion  . CARDIAC CATHETERIZATION  2006   normal  . CYSTOSCOPY/URETEROSCOPY/HOLMIUM LASER/STENT PLACEMENT Left 01/06/2019   Procedure: CYSTOSCOPY/RETROGRADE/URETEROSCOPY/HOLMIUM LASER/BASKET  RETRIEVAL/STENT PLACEMENT;  Surgeon: Ceasar Mons, MD;  Location: [facility];  Service: Urology;  Laterality: Left;  . EYE SURGERY Bilateral    cateracts  . falls     variious fall, broken wrist,and toes  . FRACTURE SURGERY Right April 2016   Wrist, Pt. fell  . IR NEPHROSTOMY PLACEMENT RIGHT  04/19/2020  . kidney stone removal Left 01/20/2019  . RIGHT/LEFT HEART CATH AND CORONARY ANGIOGRAPHY N/A 02/05/2018   Procedure: RIGHT/LEFT HEART CATH AND CORONARY ANGIOGRAPHY;  Surgeon: Troy Sine, MD;  Location: [facility];  Service: Cardiovascular;  Laterality: N/A;  . SPINAL CORD STIMULATOR INSERTION N/A 09/09/2015   Procedure: LUMBAR SPINAL CORD STIMULATOR INSERTION;  Surgeon: Clydell Hakim, MD;  Location: [facility];  Service: Neurosurgery;  Laterality: N/A;  LUMBAR SPINAL CORD STIMULATOR INSERTION  . SPINE SURGERY  April 2013   Back X's 4  . TOTAL HIP ARTHROPLASTY     right  . WRIST FRACTURE SURGERY Bilateral     Allergies  Allergen Reactions  . Adrenalone   . Lactose Other (See Comments)    abd pain, lactose intolerant  . Morphine Other (See Comments)  . Sulfa Antibiotics Other (See Comments)    Headache, very sick  . Codeine Other (See Comments)    Reaction:  Headaches and nightmares   . Lactose Intolerance (Gi) Nausea And Vomiting  . Latex Rash  . Lyrica [Pregabalin] Swelling and Other (See Comments)    Reaction:  Leg swelling  . Other Other (See Comments)    Pt states that pain medications give her nightmares.    . Plaquenil [Hydroxychloroquine Sulfate] Other (See Comments)    Reaction:  GI upset   . Reglan [Metoclopramide] Other (See Comments)    Reaction:  GI upset   . Requip [Ropinirole Hcl] Other (See Comments)    Reaction:  GI upset   . Septra [Sulfamethoxazole-Trimethoprim] Nausea And Vomiting  . Shellfish Allergy Nausea And Vomiting    Allergies as of 04/25/2020      Reactions   Adrenalone    Lactose Other (See Comments)   abd pain, lactose  intolerant   Morphine Other (See Comments)   Sulfa Antibiotics Other (See Comments)   Headache, very sick   Codeine Other (See Comments)   Reaction:  Headaches and nightmares    Lactose Intolerance (gi) Nausea And Vomiting   Latex Rash   Lyrica [pregabalin] Swelling, Other (See Comments)   Reaction:  Leg swelling   Other Other (See Comments)   Pt states that pain medications give her nightmares.     Plaquenil [hydroxychloroquine Sulfate] Other (See Comments)   Reaction:  GI upset    Reglan [metoclopramide] Other (See Comments)   Reaction:  GI upset    Requip [ropinirole Hcl] Other (See Comments)   Reaction:  GI upset    Septra [sulfamethoxazole-trimethoprim] Nausea And Vomiting   Shellfish Allergy Nausea And Vomiting      Medication List    Notice   This visit is on the same day  as an admission, and a visit start time could not be determined. If the visit took place after discharge, manually review the med list with the patient.     Review of Systems  Constitutional: Negative for activity change, appetite change, fatigue and fever.  HENT: Positive for hearing loss. Negative for congestion and voice change.   Eyes: Negative for visual disturbance.  Respiratory: Negative for cough and shortness of breath.   Cardiovascular: Positive for leg swelling. Negative for chest pain and palpitations.  Gastrointestinal: Positive for constipation. Negative for abdominal distention, abdominal pain, diarrhea, nausea and vomiting.  Genitourinary: Negative for difficulty urinating, dysuria and urgency.  Musculoskeletal: Positive for arthralgias, back pain, gait problem and myalgias.       Chronic lower back pain.   Skin: Negative for color change.       S/p percutaneous nephrectomy of the right   Neurological: Negative for dizziness, speech difficulty, weakness and headaches.       Memory lapses.   Psychiatric/Behavioral: Negative for agitation, behavioral problems, hallucinations and sleep  disturbance. The patient is not nervous/anxious.     Immunization History  Administered Date(s) Administered  . Influenza Split 09/09/2013  . Influenza, High Dose Seasonal PF 09/12/2018, 09/15/2019  . Influenza,inj,Quad PF,6+ Mos 09/11/2016  . Moderna SARS-COVID-2 Vaccination 12/12/2019, 01/09/2020  . Pneumococcal Conjugate-13 04/15/2014  . Pneumococcal Polysaccharide-23 09/28/2005  . Tdap 09/01/2015  . Zoster Recombinat (Shingrix) 06/11/2018, 09/10/2018   Pertinent  Health Maintenance Due  Topic Date Due  . INFLUENZA VACCINE  07/10/2020  . DEXA SCAN  Completed  . PNA vac Low Risk Adult  Completed   Fall Risk  11/16/2019 04/23/2019 04/09/2019 10/29/2018 10/27/2018  Falls in the past year? 0 0 0 0 1  Comment - - - fell June 2019 -  Number falls in past yr: - 0 0 - 0  Injury with Fall? - 0 0 - 1  Comment - - - - -  Risk for fall due to : - - - - -  Risk for fall due to: Comment - - - - -   Functional Status Survey:    Vitals:   04/26/20 1506  BP: 120/80  Pulse: 70  Resp: 18  Temp: 98.1 F (36.7 C)  SpO2: 95%  Weight: 144 lb 6.4 oz (65.5 kg)  Height: 5\' 2"  (1.575 m)   Body mass index is 26.41 kg/m. Physical Exam Vitals and nursing note reviewed.  Constitutional:      General: She is not in acute distress.    Appearance: Normal appearance. She is not ill-appearing, toxic-appearing or diaphoretic.     Comments: Feel tired.   HENT:     Head: Normocephalic and atraumatic.     Mouth/Throat:     Mouth: Mucous membranes are moist.  Eyes:     Extraocular Movements: Extraocular movements intact.     Conjunctiva/sclera: Conjunctivae normal.     Pupils: Pupils are equal, round, and reactive to light.  Cardiovascular:     Rate and Rhythm: Normal rate and regular rhythm.     Heart sounds: Murmur present.  Pulmonary:     Breath sounds: No rhonchi or rales.  Abdominal:     General: Bowel sounds are normal. There is no distension.     Palpations: Abdomen is soft.      Tenderness: There is no abdominal tenderness. There is no right CVA tenderness, left CVA tenderness, guarding or rebound.  Musculoskeletal:        General: No swelling  or tenderness.     Cervical back: Normal range of motion and neck supple.     Right lower leg: Edema present.     Left lower leg: Edema present.     Comments: Trace edema BLE.  Arthritic changes in fingers, R>L  Skin:    General: Skin is warm and dry.     Comments: S/p the right percutaneous nephrectomy.   Neurological:     General: No focal deficit present.     Mental Status: She is alert. Mental status is at baseline.     Motor: No weakness.     Coordination: Coordination normal.     Gait: Gait abnormal.     Comments: Oriented to person, place.   Psychiatric:        Mood and Affect: Mood normal.        Behavior: Behavior normal.     Labs reviewed: Recent Labs    04/21/20 0325 04/22/20 0605 04/22/20 0903 04/23/20 0350 04/24/20 0423 04/25/20 0427  NA 143  --    < > 144 145 146*  K 3.4*  --    < > 3.1* 3.0* 3.8  CL 115*  --    < > 119* 115* 115*  CO2 20*  --    < > 21* 22 26  GLUCOSE 129*  --    < > 80 85 88  BUN 35*  --    < > 33* 26* 16  CREATININE 1.23*  --    < > 1.01* 0.78 0.81  CALCIUM 7.2*  --    < > 7.2* 7.0* 7.6*  MG 3.0* 2.6*  --  2.5*  --   --   PHOS 3.2 2.6  --  2.3*  --   --    < > = values in this interval not displayed.   Recent Labs    04/20/20 0455 04/21/20 0325 04/24/20 0423  AST 117* 50* 32  ALT 18 8 7   ALKPHOS 95 95 77  BILITOT 0.5 0.5 0.6  PROT 5.1* 5.4* 4.7*  ALBUMIN 2.3* 2.3* 2.3*   Recent Labs    02/12/20 0000 03/31/20 0000 04/19/20 0200 04/19/20 1442 04/23/20 0350 04/23/20 0350 04/24/20 0423 04/25/20 0427 04/26/20 0000  WBC 7.5   < > 1.0*   < > 12.5*   < > 14.5* 13.8* 10.7  NEUTROABS 4,845  --  0.7*  --   --   --   --   --  7,447  HGB 11.0*   < > 9.1*   < > 7.3*   < > 8.8* 9.4* 9.2*  HCT 33*   < > 29.5*   < > 23.8*   < > 27.1* 29.7* 28*  MCV  --   --  101.4*    < > 100.4*  --  97.5 96.7  --   PLT  --    < > 131*   < > 173   < > 194 226 245   < > = values in this interval not displayed.   Lab Results  Component Value Date   TSH 1.62 09/08/2019   No results found for: HGBA1C Lab Results  Component Value Date   CHOL 118 07/14/2019   HDL 50 07/14/2019   LDLCALC 32 07/14/2019   TRIG 182 (H) 07/14/2019   CHOLHDL 2.4 07/14/2019    Significant Diagnostic Results in last 30 days:  DG Chest 1 View  Result Date: 04/19/2020 CLINICAL DATA:  Fever, tachypnea EXAM: CHEST  1 VIEW COMPARISON:  04/19/2020 FINDINGS: Heart is normal size. Mediastinal contours within normal limits. Spinal stimulator wires remain in stable position. No confluent airspace opacities or edema. Possible trace bilateral effusions. No acute bony abnormality. IMPRESSION: Possible trace bilateral effusions. Electronically Signed   By: Rolm Baptise M.D.   On: 04/19/2020 17:42   DG Chest Port 1 View  Result Date: 04/19/2020 CLINICAL DATA:  Shortness of breath and fever EXAM: PORTABLE CHEST 1 VIEW COMPARISON:  Chest radiograph 01/15/2019 FINDINGS: The heart size and mediastinal contours are within normal limits. Both lungs are clear. The visualized skeletal structures are unremarkable. Spinal stimulator leads overlie the midthoracic spine. IMPRESSION: No active disease. Electronically Signed   By: Ulyses Jarred M.D.   On: 04/19/2020 02:35   ECHOCARDIOGRAM COMPLETE  Result Date: 04/20/2020    ECHOCARDIOGRAM REPORT   Patient Name:   [name] Date of Exam: 04/20/2020 Medical Rec #:  [id number]         Height:       62.0 in Accession #:    [id number]        Weight:       157.2 lb Date of Birth:  09-Jul-1931        BSA:          1.726 m Patient Age:    77 years          BP:           99/58 mmHg Patient Gender: F                 HR:           85 bpm. Exam Location:  Inpatient Procedure: 2D Echo, Color Doppler and Cardiac Doppler Indications:    Acute Respiratory Insufficiency R06.89   History:        Patient has prior history of Echocardiogram examinations, most                 recent 12/30/2019. CHF; Risk Factors:Hypertension and                 Dyslipidemia.  Sonographer:    Raquel Sarna Senior RDCS Referring Phys: [place]  1. Since the last study on 12/30/2019 aortic stenosis is now severe - peak/mean transaortic gradients 66/41 mmHg and dimensionless index 0.21. Referral for a TAVR procedure should be considered.  2. Left ventricular ejection fraction, by estimation, is 60 to 65%. The left ventricle has normal function. The left ventricle has no regional wall motion abnormalities. There is mild concentric left ventricular hypertrophy. Left ventricular diastolic parameters are consistent with Grade I diastolic dysfunction (impaired relaxation). Elevated left atrial pressure.  3. Right ventricular systolic function is normal. The right ventricular size is normal.  4. The mitral valve is normal in structure. Mild mitral valve regurgitation. No evidence of mitral stenosis.  5. The aortic valve is normal in structure. Aortic valve regurgitation is not visualized. Severe aortic valve stenosis. Aortic valve mean gradient measures 41.0 mmHg.  6. The inferior vena cava is normal in size with greater than 50% respiratory variability, suggesting right atrial pressure of 3 mmHg. FINDINGS  Left Ventricle: Left ventricular ejection fraction, by estimation, is 60 to 65%. The left ventricle has normal function. The left ventricle has no regional wall motion abnormalities. The left ventricular internal cavity size was normal in size. There is  mild concentric left ventricular hypertrophy. Left ventricular diastolic parameters are consistent with Grade I diastolic dysfunction (impaired relaxation). Elevated  left atrial pressure. Right Ventricle: The right ventricular size is normal. No increase in right ventricular wall thickness. Right ventricular systolic function is normal. Left Atrium:  Left atrial size was normal in size. Right Atrium: Right atrial size was normal in size. Pericardium: There is no evidence of pericardial effusion. Mitral Valve: The mitral valve is normal in structure. There is mild thickening of the mitral valve leaflet(s). There is mild calcification of the mitral valve leaflet(s). Normal mobility of the mitral valve leaflets. Moderate mitral annular calcification. Mild mitral valve regurgitation. No evidence of mitral valve stenosis. Tricuspid Valve: The tricuspid valve is normal in structure. Tricuspid valve regurgitation is mild . No evidence of tricuspid stenosis. Aortic Valve: The aortic valve is normal in structure.. There is severe thickening and severe calcifcation of the aortic valve. Aortic valve regurgitation is not visualized. Severe aortic stenosis is present. There is severe thickening of the aortic valve. There is severe calcifcation of the aortic valve. Aortic valve mean gradient measures 41.0 mmHg. Aortic valve peak gradient measures 65.3 mmHg. Aortic valve area, by VTI measures 0.85 cm. Pulmonic Valve: The pulmonic valve was normal in structure. Pulmonic valve regurgitation is not visualized. No evidence of pulmonic stenosis. Aorta: The aortic root is normal in size and structure. Venous: The inferior vena cava is normal in size with greater than 50% respiratory variability, suggesting right atrial pressure of 3 mmHg. IAS/Shunts: No atrial level shunt detected by color flow Doppler.  LEFT VENTRICLE PLAX 2D LVIDd:         3.00 cm  Diastology LVIDs:         2.50 cm  LV e' lateral:   6.64 cm/s LV PW:         1.10 cm  LV E/e' lateral: 20.2 LV IVS:        1.10 cm  LV e' medial:    4.90 cm/s LVOT diam:     2.10 cm  LV E/e' medial:  27.3 LV SV:         88 LV SV Index:   51 LVOT Area:     3.46 cm  RIGHT VENTRICLE RV S prime:     6.96 cm/s TAPSE (M-mode): 2.2 cm LEFT ATRIUM             Index LA diam:        3.80 cm 2.20 cm/m LA Vol (A2C):   61.6 ml 35.70 ml/m LA Vol  (A4C):   76.9 ml 44.56 ml/m LA Biplane Vol: 75.2 ml 43.58 ml/m  AORTIC VALVE AV Area (Vmax):    0.72 cm AV Area (Vmean):   0.73 cm AV Area (VTI):     0.85 cm AV Vmax:           404.00 cm/s AV Vmean:          299.000 cm/s AV VTI:            1.030 m AV Peak Grad:      65.3 mmHg AV Mean Grad:      41.0 mmHg LVOT Vmax:         84.20 cm/s LVOT Vmean:        63.400 cm/s LVOT VTI:          0.254 m LVOT/AV VTI ratio: 0.25  AORTA Ao Root diam: 3.40 cm Ao Asc diam:  3.10 cm MITRAL VALVE MV Area (PHT): 3.72 cm     SHUNTS MV Decel Time: 204 msec     Systemic VTI:  0.25  m MV E velocity: 134.00 cm/s  Systemic Diam: 2.10 cm MV A velocity: 131.00 cm/s MV E/A ratio:  1.02 Ena Dawley MD Electronically signed by Ena Dawley MD Signature Date/Time: 04/20/2020/11:59:59 AM    Final    CT RENAL STONE STUDY  Result Date: 04/19/2020 CLINICAL DATA:  Severe sepsis.  Rule out hydronephrosis EXAM: CT ABDOMEN AND PELVIS WITHOUT CONTRAST TECHNIQUE: Multidetector CT imaging of the abdomen and pelvis was performed following the standard protocol without IV contrast. COMPARISON:  01/02/2019 FINDINGS: Lower chest: Trace pleural effusions and lower lobe atelectasis. Extensive coronary atherosclerosis. Small hiatal hernia. Hepatobiliary: No focal liver abnormality.No evidence of biliary obstruction or stone. Pancreas: Mild generalized atrophy. Spleen: Unremarkable. Adrenals/Urinary Tract: Negative adrenals. Right hydroureteronephrosis due to a obstructing 11 x 7 mm stone at the pelvic inlet. Irregular 7 mm interpolar calculus in the left kidney. Two small lower pole calculi on the right. Unremarkable bladder. Stomach/Bowel:  No obstruction. Moderate rectal stool. Vascular/Lymphatic: Atherosclerosis no acute vascular abnormality. No mass or adenopathy. Reproductive:Hysterectomy Other: No ascites or pneumoperitoneum. Musculoskeletal: Lumbar fusion from L2-S1. Severe adjacent segment degeneration at T12-L1 and especially L1-2 where there  is retrolisthesis and spinal/foraminal impingement. A spinal stimulator is present. IMPRESSION: 1. Obstructing 11 x 7 mm stone in the right ureter at the level of the pelvic inlet. 2. Bilateral nephrolithiasis. 3. Incidental findings are noted above. Electronically Signed   By: Monte Fantasia M.D.   On: 04/19/2020 06:29   IR NEPHROSTOMY PLACEMENT RIGHT  Result Date: 04/19/2020 INDICATION: Urinary sepsis secondary to right-sided hydronephrosis. EXAM: 1. ULTRASOUND GUIDANCE FOR PUNCTURE OF THE RIGHT RENAL COLLECTING SYSTEM 2. RIGHT PERCUTANEOUS NEPHROSTOMY TUBE PLACEMENT. COMPARISON:  CT abdomen pelvis-earlier same day; 01/02/2019 MEDICATIONS: Rocephin 1 gm IV; The antibiotic was administered in an appropriate time frame prior to skin puncture. ANESTHESIA/SEDATION: Moderate (conscious) sedation was employed during this procedure. A total of Versed 0.5 mg was administered intravenously. Moderate Sedation Time: 14 minutes. The patient's level of consciousness and vital signs were monitored continuously by radiology nursing throughout the procedure under my direct supervision. CONTRAST:  10 mL Isovue 300 - administered into the renal collecting system FLUOROSCOPY TIME:  1 minute, 36 seconds (79 mGy) COMPLICATIONS: None immediate. PROCEDURE: The procedure, risks, benefits, and alternatives were explained to the patient. Questions regarding the procedure were encouraged and answered. The patient understands and consents to the procedure. A timeout was performed prior to the initiation of the procedure. The right flank region was prepped and draped in the usual sterile fashion and a sterile drape was applied covering the operative field. A sterile gown and sterile gloves were used for the procedure. Local anesthesia was provided with 1% Lidocaine with epinephrine. Ultrasound was used to localize the right kidney. Under direct ultrasound guidance, a 20 gauge needle was advanced into the renal collecting system. An  ultrasound image documentation was performed. Access within the collecting system was confirmed with the efflux of urine followed by limited contrast injection. Over a Nitrex wire, the tract was dilated with an Accustick stent. Next, under intermittent fluoroscopic guidance and over a short Amplatz wire, the track was dilated ultimately allowing placement of a 10-French percutaneous nephrostomy catheter which was advanced to the level of the renal pelvis where the coil was formed and locked. Contrast was injected and several spot fluoroscopic images were obtained in various obliquities. The catheter was secured at the skin with a Prolene retention suture and stat lock device and connected to a gravity bag was placed. Dressings were  applied. The patient tolerated procedure well without immediate postprocedural complication. FINDINGS: Ultrasound scanning demonstrates a moderate to severely dilated right collecting system. Under a combination of ultrasound and fluoroscopic guidance, a posterior inferior calix was targeted allowing placement of a 10-French percutaneous nephrostomy catheter with end coiled and locked within the renal pelvis. Contrast injection confirmed appropriate positioning. Limited contrast injection suggest questioned right proximal ureteral stone likely represents a calcified phlebolith as was suggested on more remote abdominal CT performed 01/02/2019. IMPRESSION: 1. Successful ultrasound and fluoroscopic guided placement of a right sided 10 [place] PCN. 2. Limited contrast injection confirms questioned proximal right ureteral/UPJ stone likely represents a calcified phleboliths as was suggested on more remote abdominal CT 01/02/2019. PLAN: As the etiology of the patient's right-sided urinary obstruction remains indeterminate, the patient may return for right-sided antegrade nephrostogram with potential right-sided ureteral stent placement in 2-3 weeks as indicated. Electronically Signed   By: Sandi Mariscal M.D.   On: 04/19/2020 10:47    Assessment/Plan: Chronic constipation Resume Amitiza, continue Colace qd, Senokot S qd.   Aortic stenosis F/u cardiology.   Chronic diastolic CHF (congestive heart failure) (HCC) Compensated, continue Furosemide.   Hypertension Blood pressure is controlled.   GERD (gastroesophageal reflux disease) Stable, continue Prevacid  Hypothyroidism Stable, continue Levothyroxine 83mcg qd, TSH 1.62 09/08/19  Parkinson's disease Stable, continue Sinemet, Mirapex.  Erosive (osteo)arthritis F/u rheumatology, Sjogren's, continue Plaquenil, Prednisone, Tylenol.   Acute kidney injury ([place]) Resolved.   Acute lower UTI Will complete 8 more days of Keflex 500mg  qid  Obstruction of right ureteropelvic junction due to stone S/p percutaneous nephrectomy R, f/u urology.    Depression, psychotic ([place]) Her mood is stable, continue Sertraline, Quetiapine.   Hallucinations, visual Resolved, continue Quetiapine.   Iron deficiency anemia S/p one unit PRBC transfusion, Hgb improved from 7.3 to 9.2, continue Fe, f/u CBC  Leukocytosis 04/26/20 wbc 10.7  Sjogren's syndrome (HCC) Chronic, f/u rheumatology.     Family/ staff Communication: plan of care reviewed with the patient and charge nurse.   Labs/tests ordered:  F/u CBC, CMP  Time spend 25 minutes.

## 2020-04-25 NOTE — Progress Notes (Signed)
Physical Therapy Treatment Patient Details Name: [name] MRN: [id number] DOB: August 06, 1931 Today's Date: 04/25/2020    History of Present Illness 86 year old female with history of Parkinson's disease, hypothyroidism, mild dementia, hyperlipidemia, admitted to the ICU on 5/11 with septic shock due to UTI.  She initially required pressors.  She was found to have an obstructive 11 x 7 mm stone in the right ureter at the level of the pelvic inlet.  Urology and IR were consulted and she is status post nephrostomy tube placement on the right side    PT Comments    Pt assisted to [facility] and then recliner.  Nurse tech assisted with bathing and pericare.  Pt requiring at least mod assist +2 for safety with transfers.  Possible d/c to SNF today.    Follow Up Recommendations  SNF     Equipment Recommendations  None recommended by PT    Recommendations for Other Services       Precautions / Restrictions Precautions Precautions: Fall Precaution Comments: Right PCN    Mobility  Bed Mobility Overal bed mobility: Needs Assistance Bed Mobility: Supine to Sit     Supine to sit: Min assist     General bed mobility comments: assist for trunk upright  Transfers Overall transfer level: Needs assistance Equipment used: Rolling walker (2 wheeled) Transfers: Sit to/from [facility] Sit to Stand: Mod assist;+2 physical assistance Stand pivot transfers: Mod assist;+2 physical assistance       General transfer comment: multimodal cues for technique and upright posture.  strong posterior and right lateral lean requiring assist, assisted to [facility] and then recliner  Ambulation/Gait                 Stairs             Wheelchair Mobility    Modified Rankin (Stroke Patients Only)       Balance Overall balance assessment: History of Falls;Needs assistance       Postural control: Posterior lean Standing balance support: Bilateral upper extremity  supported Standing balance-Leahy Scale: Zero                              Cognition Arousal/Alertness: Awake/alert Behavior During Therapy: WFL for tasks assessed/performed Overall Cognitive Status: History of cognitive impairments - at baseline                                 General Comments: hx mild dementia, pt fearful of falling      Exercises      General Comments        Pertinent Vitals/Pain Pain Assessment: No/denies pain    Home Living                      Prior Function            PT Goals (current goals can now be found in the care plan section) Progress towards PT goals: Progressing toward goals    Frequency    Min 2X/week      PT Plan Current plan remains appropriate    Co-evaluation              AM-PAC PT "6 Clicks" Mobility   Outcome Measure  Help needed turning from your back to your side while in a flat bed without using bedrails?: A Little Help needed moving from  lying on your back to sitting on the side of a flat bed without using bedrails?: A Lot Help needed moving to and from a bed to a chair (including a wheelchair)?: A Lot Help needed standing up from a chair using your arms (e.g., wheelchair or bedside chair)?: A Lot Help needed to walk in hospital room?: Total Help needed climbing 3-5 steps with a railing? : Total 6 Click Score: 11    End of Session Equipment Utilized During Treatment: Gait belt Activity Tolerance: Patient tolerated treatment well Patient left: in chair;with call bell/phone within reach;with chair alarm set Nurse Communication: Mobility status PT Visit Diagnosis: Other abnormalities of gait and mobility (R26.89)     Time: [id number] PT Time Calculation (min) (ACUTE ONLY): 21 min  Charges:  $Therapeutic Activity: 8-22 mins                    Arlyce Dice, DPT Acute Rehabilitation Services Office: [place] 04/25/2020, 11:47 AM

## 2020-04-25 NOTE — Progress Notes (Signed)
Referring Physician(s): Gardner,J/Wrenn,J  Supervising Physician: Jacqulynn Cadet  Patient Status:  [facility] - In-pt  Chief Complaint: Right kidney stone, prior sepsis/right hydronephrosis   Subjective: Patient scheduled for discharge today; has some soreness at right nephrostomy insertion site, otherwise doing okay   Allergies: Adrenalone, Lactose, Morphine, Sulfa antibiotics, Codeine, Lactose intolerance (gi), Latex, Lyrica [pregabalin], Other, Plaquenil [hydroxychloroquine sulfate], Reglan [metoclopramide], Requip [ropinirole hcl], Septra [sulfamethoxazole-trimethoprim], and Shellfish allergy  Medications: Prior to Admission medications   Medication Sig Start Date End Date Taking? Authorizing Provider  acetaminophen (TYLENOL) 500 MG tablet Take 500 mg by mouth daily as needed for mild pain.    Yes [provider]  acetaminophen (TYLENOL) 500 MG tablet Take 500 mg by mouth 2 (two) times daily.   Yes [provider]  albuterol (PROVENTIL HFA;VENTOLIN HFA) 108 (90 Base) MCG/ACT inhaler Inhale 2 puffs into the lungs every 6 (six) hours as needed for wheezing or shortness of breath. 01/15/19  Yes Collene Gobble, MD  aspirin EC 81 MG tablet Take 81 mg by mouth daily.    Yes [provider]  Calcium Carb-Cholecalciferol (CALCIUM 600/VITAMIN D3) 600-800 MG-UNIT TABS Take 1 tablet by mouth daily.   Yes [provider]  carbidopa-levodopa (SINEMET IR) 25-100 MG tablet Take 2 tablets by mouth in the morning and at bedtime.    Yes [provider]  Cholecalciferol (VITAMIN D) 2000 units CAPS Take 2,000 Units by mouth daily.   Yes [provider]  clotrimazole-betamethasone (LOTRISONE) cream Apply 1 application topically 2 (two) times daily. 04/11/20  Yes Trula Slade, DPM  docusate sodium (COLACE) 100 MG capsule Take 100 mg by mouth at bedtime.   Yes [provider]  ezetimibe (ZETIA) 10 MG tablet TAKE 1 TABLET ONCE DAILY.  06/01/19  Yes [name], MD  Ferrous Sulfate (SLOW FE) 142 (45 Fe) MG TBCR Take 1 tablet by mouth. Daily   Yes [provider]  hydrocortisone 2.5 % cream Apply 1 application topically 2 (two) times daily.   Yes [provider]  hydroxychloroquine (PLAQUENIL) 200 MG tablet Take 200 mg by mouth daily.   Yes [provider]  lansoprazole (PREVACID SOLUTAB) 30 MG disintegrating tablet Take 30 mg by mouth daily at 12 noon.   Yes [provider]  levothyroxine (SYNTHROID, LEVOTHROID) 50 MCG tablet Take 50 mcg by mouth daily before breakfast.    Yes [provider]  lubiprostone (AMITIZA) 24 MCG capsule Take 24 mcg by mouth daily.    Yes [provider]  Multiple Vitamin (MULTIVITAMIN WITH MINERALS) TABS tablet Take 1 tablet by mouth daily.   Yes [provider]  Multiple Vitamins-Minerals (PRESERVISION AREDS 2) CHEW Chew 1 tablet by mouth daily.    Yes [provider]  nitrofurantoin, macrocrystal-monohydrate, (MACROBID) 100 MG capsule Take 100 mg by mouth 2 (two) times daily.   Yes [provider]  nystatin (MYCOSTATIN/NYSTOP) powder Apply topically 2 (two) times daily as needed. Patient taking differently: Apply topically 2 (two) times daily as needed (dermatitis).  12/18/18  Yes [name], Man X, NP  Polyethyl Glycol-Propyl Glycol (SYSTANE) 0.4-0.3 % SOLN Apply 1 drop to eye 2 (two) times daily as needed (dry eyes).    Yes [provider]  pramipexole (MIRAPEX) 1 MG tablet Take 1 mg by mouth 2 (two) times daily.   Yes [provider]  QUEtiapine (SEROQUEL) 25 MG tablet Take 12.5-25 mg by mouth See admin instructions. Take 12.5 mg in Am and 25  mg in PM   Yes [provider]  rosuvastatin (CRESTOR) 20 MG tablet Take 1 tablet (20 mg total) by mouth daily. 05/01/19 04/30/20 Yes Turner, Eber Hong, MD  saccharomyces boulardii (FLORASTOR) 250 MG capsule Take 250 mg by mouth 2 (two) times daily.   Yes  [provider]  senna-docusate (SENOKOT-S) 8.6-50 MG tablet Take 1 tablet by mouth at bedtime.    Yes [provider]  sertraline (ZOLOFT) 50 MG tablet Take 75 mg by mouth daily. Take 1.5 tablets to = 75 mg   Yes [provider]  Skin Protectants, Misc. (EUCERIN) cream Apply 1 application topically daily.    Yes [provider]  Spacer/Aero-Holding Chambers (AEROCHAMBER MV) inhaler by Other route. Use as instructed as needed   Yes [provider]  cephALEXin (KEFLEX) 500 MG capsule Take 1 capsule (500 mg total) by mouth 4 (four) times daily for 8 days. 04/25/20 05/03/20  Caren Griffins, MD  predniSONE (DELTASONE) 1 MG tablet Take 3 tablets (3 mg total) by mouth daily with breakfast for 4 days. 3 tabs x5 days 04/25/20 04/29/20  Caren Griffins, MD  predniSONE (DELTASONE) 20 MG tablet Take 1 tablet (20 mg total) by mouth daily with breakfast. 04/26/20   Caren Griffins, MD     Vital Signs: BP (!) 162/69 (BP Location: Right Arm)   Pulse 66   Temp 98.4 F (36.9 C) (Oral)   Resp 18   Ht 5\' 2"  (1.575 m)   Wt 157 lb 3 oz (71.3 kg)   SpO2 94%   BMI 28.75 kg/m   Physical Exam awake, alert.  Right nephrostomy intact, insertion site okay, no erythema or drainage at site, slightly tender to palpation.  Output approximately 1 L of yellow urine  Imaging: No results found.  Labs:  CBC: Recent Labs    04/22/20 0903 04/23/20 0350 04/24/20 0423 04/25/20 0427  WBC 22.3* 12.5* 14.5* 13.8*  HGB 8.0* 7.3* 8.8* 9.4*  HCT 25.5* 23.8* 27.1* 29.7*  PLT 164 173 194 226    COAGS: Recent Labs    04/19/20 0200  INR 1.3*  APTT 29    BMP: Recent Labs    04/22/20 0903 04/23/20 0350 04/24/20 0423 04/25/20 0427  NA 148* 144 145 146*  K 3.7 3.1* 3.0* 3.8  CL 121* 119* 115* 115*  CO2 21* 21* 22 26  GLUCOSE 98 80 85 88  BUN 35* 33* 26* 16  CALCIUM 7.6* 7.2* 7.0* 7.6*  CREATININE 1.00 1.01* 0.78 0.81  GFRNONAA 51* 50* >60 >60  GFRAA 59* 58*  >60 >60    LIVER FUNCTION TESTS: Recent Labs    04/19/20 1442 04/20/20 0455 04/21/20 0325 04/24/20 0423  BILITOT 0.5 0.5 0.5 0.6  AST 217* 117* 50* 32  ALT 77* 18 8 7   ALKPHOS 109 95 95 77  PROT 5.1* 5.1* 5.4* 4.7*  ALBUMIN 2.3* 2.3* 2.3* 2.3*    Assessment and Plan: Patient with history of urosepsis, nephrolithiasis with obstructing right ureteral stone/hydronephrosis, AKI,elevated troponin;status post right percutaneous nephrostomy on 5/11. Afebrile, WBC 13.8 down from 14.5, hemoglobin 9.4, creatinine normal, previous urine cultures with E. Coli- on keflex; patient to follow-up with urology in 1 week   Electronically Signed: D. Rowe Robert, PA-C 04/25/2020, 2:06 PM   I spent a total of 15 minutes at the the patient's bedside AND on the patient's hospital floor or unit, greater than 50% of which was counseling/coordinating care for right nephrostomy    Patient  ID: [name], female   DOB: January 22, 1931, 86 y.o.   MRN: [id number]

## 2020-04-25 NOTE — Discharge Instructions (Signed)
Follow with Amanda Dad, MD in 5-7 days  Please get a complete blood count and chemistry panel checked by your Primary MD at your next visit, and again as instructed by your Primary MD. Please get your medications reviewed and adjusted by your Primary MD.  Please request your Primary MD to go over all Hospital Tests and Procedure/Radiological results at the follow up, please get all Hospital records sent to your Prim MD by signing hospital release before you go home.  In some cases, there will be blood work, cultures and biopsy results pending at the time of your discharge. Please request that your primary care M.D. goes through all the records of your hospital data and follows up on these results.  If you had Pneumonia of Lung problems at the Hospital: Please get a 2 view Chest X ray done in 6-8 weeks after hospital discharge or sooner if instructed by your Primary MD.  If you have Congestive Heart Failure: Please call your Cardiologist or Primary MD anytime you have any of the following symptoms:  1) 3 pound weight gain in 24 hours or 5 pounds in 1 week  2) shortness of breath, with or without a dry hacking cough  3) swelling in the hands, feet or stomach  4) if you have to sleep on extra pillows at night in order to breathe  Follow cardiac low salt diet and 1.5 lit/day fluid restriction.  If you have diabetes Accuchecks 4 times/day, Once in AM empty stomach and then before each meal. Log in all results and show them to your primary doctor at your next visit. If any glucose reading is under 80 or above 300 call your primary MD immediately.  If you have Seizure/Convulsions/Epilepsy: Please do not drive, operate heavy machinery, participate in activities at heights or participate in high speed sports until you have seen by Primary MD or a Neurologist and advised to do so again. Per [employer] [facility] statutes, patients with seizures are not allowed to drive until they have been  seizure-free for six months.  Use caution when using heavy equipment or power tools. Avoid working on ladders or at heights. Take showers instead of baths. Ensure the water temperature is not too high on the home water heater. Do not go swimming alone. Do not lock yourself in a room alone (i.e. bathroom). When caring for infants or small children, sit down when holding, feeding, or changing them to minimize risk of injury to the child in the event you have a seizure. Maintain good sleep hygiene. Avoid alcohol.   If you had Gastrointestinal Bleeding: Please ask your Primary MD to check a complete blood count within one week of discharge or at your next visit. Your endoscopic/colonoscopic biopsies that are pending at the time of discharge, will also need to followed by your Primary MD.  Get Medicines reviewed and adjusted. Please take all your medications with you for your next visit with your Primary MD  Please request your Primary MD to go over all hospital tests and procedure/radiological results at the follow up, please ask your Primary MD to get all Hospital records sent to his/her office.  If you experience worsening of your admission symptoms, develop shortness of breath, life threatening emergency, suicidal or homicidal thoughts you must seek medical attention immediately by calling 911 or calling your MD immediately  if symptoms less severe.  You must read complete instructions/literature along with all the possible adverse reactions/side effects for all the Medicines you  take and that have been prescribed to you. Take any new Medicines after you have completely understood and accpet all the possible adverse reactions/side effects.   Do not drive or operate heavy machinery when taking Pain medications.   Do not take more than prescribed Pain, Sleep and Anxiety Medications  Special Instructions: If you have smoked or chewed Tobacco  in the last 2 yrs please stop smoking, stop any regular  Alcohol  and or any Recreational drug use.  Wear Seat belts while driving.  Please note You were cared for by a hospitalist during your hospital stay. If you have any questions about your discharge medications or the care you received while you were in the hospital after you are discharged, you can call the unit and asked to speak with the hospitalist on call if the hospitalist that took care of you is not available. Once you are discharged, your primary care physician will handle any further medical issues. Please note that NO REFILLS for any discharge medications will be authorized once you are discharged, as it is imperative that you return to your primary care physician (or establish a relationship with a primary care physician if you do not have one) for your aftercare needs so that they can reassess your need for medications and monitor your lab values.  You can reach the hospitalist office at phone [phone] or fax [phone]   If you do not have a primary care physician, you can call [phone] for a physician referral.  Activity: As tolerated with Full fall precautions use walker/cane & assistance as needed    Diet: regular  Disposition Home

## 2020-04-25 NOTE — Discharge Summary (Signed)
Physician Discharge Summary  [name] [id number] DOB: 11/21/1931 DOA: 04/19/2020  PCP: [name], MD  Admit date: 04/19/2020 Discharge date: 04/25/2020  Admitted From: SNF Disposition:  SNF  Recommendations for Outpatient Follow-up:  1. Follow up with PCP in 1-2 weeks 2. Follow-up with urology as scheduled 3. Continue Keflex for 8 days 4. Prednisone times daily for 2 days then resume on chronic dose  Home Health: None Equipment/Devices: none   Discharge Condition: stable CODE STATUS: Full code Diet recommendation: regular  HPI: Per admitting MD, [name] is a 86 y.o. female with medical history significant of mod AS, HFpEF, recurrent UTIs, Sjogren's syndrome recently started on plaquenil and prednisone which she had been taking being tapered. Pt recently dealing with symptoms of UTI for past 10 days. Was started on empirical levaquin for UTI given symptoms on 5/7.  Culture would come back with E.Coli > 100k cfu/ml, Intermediate to levaquin so she was switched yesterday to macrobid. WBC on 5/7 was 17.6k.  Hospital Course / Discharge diagnoses: Principal Problem Septic shock due to E. coli UTI, obstructive right kidney stone POA-patient was initially admitted to the ICU requiring vasopressors.  She is status post right-sided percutaneous nephrostomy tube.  Clinically she is improving and was transferred to the hospitalist service 5/13. Urine cultures are showing E. coli intermediate to ampicillin but otherwise sensitive.  Narrowed to Ancef, has remained stable, afebrile, leukocytosis (improving) transitioned to Keflex for 8 additional days on discharge  Active Problems Acute kidney injury-in setting of septic shock, creatinine has now normalized.  She was maintained off IVF with good p.o. intake and her kidney function has remained stable Hypokalemia/hypomagnesemia-improved Anemia-in the setting of shock, hemoglobin overall stable, received a unit of packed  red blood cells on 5/15 for hemoglobin of 7.3, improved appropriately and stable.  No bleeding Facial swelling /hand swelling-resolved, unclear etiology, apparently received Lasix on 5/11.  No recurrence Mild Transaminitis -Likely due to shock, LFTs have normalized Elevated Troponin  Known RBBB -Suspect demand ischemia in the setting of shock Hx Sjogrens-On plaquenil and prednisone.  Hx hypothyroidism -continue synthroid Hx GERD -PPI Hx HTN, HLD, RBB, dCHF, AS. -continue home medications Hx depression, visual hallucinations, fibromyalgia, anxiety, parkinsons, RLS -continue home medications Aortic stenosis-2D echo done showed normal EF but severe AS.  Outpatient cardiology follow-up  Discharge Instructions   Allergies as of 04/25/2020      Reactions   Adrenalone    Lactose Other (See Comments)   abd pain, lactose intolerant   Morphine Other (See Comments)   Sulfa Antibiotics Other (See Comments)   Headache, very sick   Codeine Other (See Comments)   Reaction:  Headaches and nightmares    Lactose Intolerance (gi) Nausea And Vomiting   Latex Rash   Lyrica [pregabalin] Swelling, Other (See Comments)   Reaction:  Leg swelling   Other Other (See Comments)   Pt states that pain medications give her nightmares.     Plaquenil [hydroxychloroquine Sulfate] Other (See Comments)   Reaction:  GI upset    Reglan [metoclopramide] Other (See Comments)   Reaction:  GI upset    Requip [ropinirole Hcl] Other (See Comments)   Reaction:  GI upset    Septra [sulfamethoxazole-trimethoprim] Nausea And Vomiting   Shellfish Allergy Nausea And Vomiting      Medication List    TAKE these medications   acetaminophen 500 MG tablet Commonly known as: TYLENOL Take 500 mg by mouth daily as needed for mild pain.  TYLENOL 500 MG tablet Generic drug: acetaminophen Take 500 mg by mouth 2 (two) times daily.   AeroChamber MV inhaler by Other route. Use as instructed as needed   albuterol 108 (90  Base) MCG/ACT inhaler Commonly known as: VENTOLIN HFA Inhale 2 puffs into the lungs every 6 (six) hours as needed for wheezing or shortness of breath.   aspirin EC 81 MG tablet Take 81 mg by mouth daily.   Calcium 600/Vitamin D3 600-800 MG-UNIT Tabs Generic drug: Calcium Carb-Cholecalciferol Take 1 tablet by mouth daily.   carbidopa-levodopa 25-100 MG tablet Commonly known as: SINEMET IR Take 2 tablets by mouth in the morning and at bedtime.   cephALEXin 500 MG capsule Commonly known as: KEFLEX Take 1 capsule (500 mg total) by mouth 4 (four) times daily for 8 days.   clotrimazole-betamethasone cream Commonly known as: Lotrisone Apply 1 application topically 2 (two) times daily.   docusate sodium 100 MG capsule Commonly known as: COLACE Take 100 mg by mouth at bedtime.   eucerin cream Apply 1 application topically daily.   ezetimibe 10 MG tablet Commonly known as: ZETIA TAKE 1 TABLET ONCE DAILY.   hydrocortisone 2.5 % cream Apply 1 application topically 2 (two) times daily.   hydroxychloroquine 200 MG tablet Commonly known as: PLAQUENIL Take 200 mg by mouth daily.   lansoprazole 30 MG disintegrating tablet Commonly known as: PREVACID SOLUTAB Take 30 mg by mouth daily at 12 noon.   levothyroxine 50 MCG tablet Commonly known as: SYNTHROID Take 50 mcg by mouth daily before breakfast.   lubiprostone 24 MCG capsule Commonly known as: AMITIZA Take 24 mcg by mouth daily.   multivitamin with minerals Tabs tablet Take 1 tablet by mouth daily.   nitrofurantoin (macrocrystal-monohydrate) 100 MG capsule Commonly known as: MACROBID Take 100 mg by mouth 2 (two) times daily.   nystatin powder Commonly known as: MYCOSTATIN/NYSTOP Apply topically 2 (two) times daily as needed. What changed: reasons to take this   pramipexole 1 MG tablet Commonly known as: MIRAPEX Take 1 mg by mouth 2 (two) times daily.   predniSONE 1 MG tablet Commonly known as: DELTASONE Take 3  tablets (3 mg total) by mouth daily with breakfast for 4 days. 3 tabs x5 days What changed: Another medication with the same name was added. Make sure you understand how and when to take each.   predniSONE 20 MG tablet Commonly known as: DELTASONE Take 1 tablet (20 mg total) by mouth daily with breakfast. Start taking on: Apr 26, 2020 What changed: You were already taking a medication with the same name, and this prescription was added. Make sure you understand how and when to take each.   PreserVision AREDS 2 Chew Chew 1 tablet by mouth daily.   QUEtiapine 25 MG tablet Commonly known as: SEROQUEL Take 12.5-25 mg by mouth See admin instructions. Take 12.5 mg in Am and 25 mg in PM   rosuvastatin 20 MG tablet Commonly known as: CRESTOR Take 1 tablet (20 mg total) by mouth daily.   saccharomyces boulardii 250 MG capsule Commonly known as: FLORASTOR Take 250 mg by mouth 2 (two) times daily.   senna-docusate 8.6-50 MG tablet Commonly known as: Senokot-S Take 1 tablet by mouth at bedtime.   sertraline 50 MG tablet Commonly known as: ZOLOFT Take 75 mg by mouth daily. Take 1.5 tablets to = 75 mg   Slow Fe 142 (45 Fe) MG Tbcr Generic drug: Ferrous Sulfate Take 1 tablet by mouth. Daily   Systane  0.4-0.3 % Soln Generic drug: Polyethyl Glycol-Propyl Glycol Apply 1 drop to eye 2 (two) times daily as needed (dry eyes).   Vitamin D 50 MCG (2000 UT) Caps Take 2,000 Units by mouth daily.      Follow-up Information    Ceasar Mons, MD In 1 week.   Specialty: Urology Why: My office will call to arrange your follow-up appointment in approximately one week. Contact information: [address] [place] [address] [phone]        [name], MD. Schedule an appointment as soon as possible for a visit in 2 week(s).   Specialty: Internal Medicine Contact information: [place] [phone] [phone]        [name],  MD .   Specialty: Cardiology Contact information: [phone] [place] [address] [phone]           Consultations:  Urology   PCCM  Procedures/Studies:  DG Chest 1 View  Result Date: 04/19/2020 CLINICAL DATA:  Fever, tachypnea EXAM: CHEST  1 VIEW COMPARISON:  04/19/2020 FINDINGS: Heart is normal size. Mediastinal contours within normal limits. Spinal stimulator wires remain in stable position. No confluent airspace opacities or edema. Possible trace bilateral effusions. No acute bony abnormality. IMPRESSION: Possible trace bilateral effusions. Electronically Signed   By: Rolm Baptise M.D.   On: 04/19/2020 17:42   DG Chest Port 1 View  Result Date: 04/19/2020 CLINICAL DATA:  Shortness of breath and fever EXAM: PORTABLE CHEST 1 VIEW COMPARISON:  Chest radiograph 01/15/2019 FINDINGS: The heart size and mediastinal contours are within normal limits. Both lungs are clear. The visualized skeletal structures are unremarkable. Spinal stimulator leads overlie the midthoracic spine. IMPRESSION: No active disease. Electronically Signed   By: Ulyses Jarred M.D.   On: 04/19/2020 02:35   ECHOCARDIOGRAM COMPLETE  Result Date: 04/20/2020    ECHOCARDIOGRAM REPORT   Patient Name:   KALEAHA GALASKA Date of Exam: 04/20/2020 Medical Rec #:  [id number]         Height:       62.0 in Accession #:    [id number]        Weight:       157.2 lb Date of Birth:  [birth year]        BSA:          1.726 m Patient Age:    86 years          BP:           99/58 mmHg Patient Gender: F                 HR:           85 bpm. Exam Location:  Inpatient Procedure: 2D Echo, Color Doppler and Cardiac Doppler Indications:    Acute Respiratory Insufficiency R06.89  History:        Patient has prior history of Echocardiogram examinations, most                 recent 12/30/2019. CHF; Risk Factors:Hypertension and                 Dyslipidemia.  Sonographer:    Raquel Sarna Senior RDCS Referring Phys: [place]  1. Since the last study on 12/30/2019 aortic stenosis is now severe - peak/mean transaortic gradients 66/41 mmHg and dimensionless index 0.21. Referral for a TAVR procedure should be considered.  2. Left ventricular ejection fraction, by  estimation, is 60 to 65%. The left ventricle has normal function. The left ventricle has no regional wall motion abnormalities. There is mild concentric left ventricular hypertrophy. Left ventricular diastolic parameters are consistent with Grade I diastolic dysfunction (impaired relaxation). Elevated left atrial pressure.  3. Right ventricular systolic function is normal. The right ventricular size is normal.  4. The mitral valve is normal in structure. Mild mitral valve regurgitation. No evidence of mitral stenosis.  5. The aortic valve is normal in structure. Aortic valve regurgitation is not visualized. Severe aortic valve stenosis. Aortic valve mean gradient measures 41.0 mmHg.  6. The inferior vena cava is normal in size with greater than 50% respiratory variability, suggesting right atrial pressure of 3 mmHg. FINDINGS  Left Ventricle: Left ventricular ejection fraction, by estimation, is 60 to 65%. The left ventricle has normal function. The left ventricle has no regional wall motion abnormalities. The left ventricular internal cavity size was normal in size. There is  mild concentric left ventricular hypertrophy. Left ventricular diastolic parameters are consistent with Grade I diastolic dysfunction (impaired relaxation). Elevated left atrial pressure. Right Ventricle: The right ventricular size is normal. No increase in right ventricular wall thickness. Right ventricular systolic function is normal. Left Atrium: Left atrial size was normal in size. Right Atrium: Right atrial size was normal in size. Pericardium: There is no evidence of pericardial effusion. Mitral Valve: The mitral valve is normal in structure. There is mild thickening of the mitral valve  leaflet(s). There is mild calcification of the mitral valve leaflet(s). Normal mobility of the mitral valve leaflets. Moderate mitral annular calcification. Mild mitral valve regurgitation. No evidence of mitral valve stenosis. Tricuspid Valve: The tricuspid valve is normal in structure. Tricuspid valve regurgitation is mild . No evidence of tricuspid stenosis. Aortic Valve: The aortic valve is normal in structure.. There is severe thickening and severe calcifcation of the aortic valve. Aortic valve regurgitation is not visualized. Severe aortic stenosis is present. There is severe thickening of the aortic valve. There is severe calcifcation of the aortic valve. Aortic valve mean gradient measures 41.0 mmHg. Aortic valve peak gradient measures 65.3 mmHg. Aortic valve area, by VTI measures 0.85 cm. Pulmonic Valve: The pulmonic valve was normal in structure. Pulmonic valve regurgitation is not visualized. No evidence of pulmonic stenosis. Aorta: The aortic root is normal in size and structure. Venous: The inferior vena cava is normal in size with greater than 50% respiratory variability, suggesting right atrial pressure of 3 mmHg. IAS/Shunts: No atrial level shunt detected by color flow Doppler.  LEFT VENTRICLE PLAX 2D LVIDd:         3.00 cm  Diastology LVIDs:         2.50 cm  LV e' lateral:   6.64 cm/s LV PW:         1.10 cm  LV E/e' lateral: 20.2 LV IVS:        1.10 cm  LV e' medial:    4.90 cm/s LVOT diam:     2.10 cm  LV E/e' medial:  27.3 LV SV:         88 LV SV Index:   51 LVOT Area:     3.46 cm  RIGHT VENTRICLE RV S prime:     6.96 cm/s TAPSE (M-mode): 2.2 cm LEFT ATRIUM             Index LA diam:        3.80 cm 2.20 cm/m LA Vol (A2C):   61.6 ml  35.70 ml/m LA Vol (A4C):   76.9 ml 44.56 ml/m LA Biplane Vol: 75.2 ml 43.58 ml/m  AORTIC VALVE AV Area (Vmax):    0.72 cm AV Area (Vmean):   0.73 cm AV Area (VTI):     0.85 cm AV Vmax:           404.00 cm/s AV Vmean:          299.000 cm/s AV VTI:             1.030 m AV Peak Grad:      65.3 mmHg AV Mean Grad:      41.0 mmHg LVOT Vmax:         84.20 cm/s LVOT Vmean:        63.400 cm/s LVOT VTI:          0.254 m LVOT/AV VTI ratio: 0.25  AORTA Ao Root diam: 3.40 cm Ao Asc diam:  3.10 cm MITRAL VALVE MV Area (PHT): 3.72 cm     SHUNTS MV Decel Time: 204 msec     Systemic VTI:  0.25 m MV E velocity: 134.00 cm/s  Systemic Diam: 2.10 cm MV A velocity: 131.00 cm/s MV E/A ratio:  1.02 Ena Dawley MD Electronically signed by Ena Dawley MD Signature Date/Time: 04/20/2020/11:59:59 AM    Final    CT RENAL STONE STUDY  Result Date: 04/19/2020 CLINICAL DATA:  Severe sepsis.  Rule out hydronephrosis EXAM: CT ABDOMEN AND PELVIS WITHOUT CONTRAST TECHNIQUE: Multidetector CT imaging of the abdomen and pelvis was performed following the standard protocol without IV contrast. COMPARISON:  01/02/2019 FINDINGS: Lower chest: Trace pleural effusions and lower lobe atelectasis. Extensive coronary atherosclerosis. Small hiatal hernia. Hepatobiliary: No focal liver abnormality.No evidence of biliary obstruction or stone. Pancreas: Mild generalized atrophy. Spleen: Unremarkable. Adrenals/Urinary Tract: Negative adrenals. Right hydroureteronephrosis due to a obstructing 11 x 7 mm stone at the pelvic inlet. Irregular 7 mm interpolar calculus in the left kidney. Two small lower pole calculi on the right. Unremarkable bladder. Stomach/Bowel:  No obstruction. Moderate rectal stool. Vascular/Lymphatic: Atherosclerosis no acute vascular abnormality. No mass or adenopathy. Reproductive:Hysterectomy Other: No ascites or pneumoperitoneum. Musculoskeletal: Lumbar fusion from L2-S1. Severe adjacent segment degeneration at T12-L1 and especially L1-2 where there is retrolisthesis and spinal/foraminal impingement. A spinal stimulator is present. IMPRESSION: 1. Obstructing 11 x 7 mm stone in the right ureter at the level of the pelvic inlet. 2. Bilateral nephrolithiasis. 3. Incidental findings are noted  above. Electronically Signed   By: Monte Fantasia M.D.   On: 04/19/2020 06:29   IR NEPHROSTOMY PLACEMENT RIGHT  Result Date: 04/19/2020 INDICATION: Urinary sepsis secondary to right-sided hydronephrosis. EXAM: 1. ULTRASOUND GUIDANCE FOR PUNCTURE OF THE RIGHT RENAL COLLECTING SYSTEM 2. RIGHT PERCUTANEOUS NEPHROSTOMY TUBE PLACEMENT. COMPARISON:  CT abdomen pelvis-earlier same day; 01/02/2019 MEDICATIONS: Rocephin 1 gm IV; The antibiotic was administered in an appropriate time frame prior to skin puncture. ANESTHESIA/SEDATION: Moderate (conscious) sedation was employed during this procedure. A total of Versed 0.5 mg was administered intravenously. Moderate Sedation Time: 14 minutes. The patient's level of consciousness and vital signs were monitored continuously by radiology nursing throughout the procedure under my direct supervision. CONTRAST:  10 mL Isovue 300 - administered into the renal collecting system FLUOROSCOPY TIME:  1 minute, 36 seconds (79 mGy) COMPLICATIONS: None immediate. PROCEDURE: The procedure, risks, benefits, and alternatives were explained to the patient. Questions regarding the procedure were encouraged and answered. The patient understands and consents to the procedure. A timeout was performed prior to the  initiation of the procedure. The right flank region was prepped and draped in the usual sterile fashion and a sterile drape was applied covering the operative field. A sterile gown and sterile gloves were used for the procedure. Local anesthesia was provided with 1% Lidocaine with epinephrine. Ultrasound was used to localize the right kidney. Under direct ultrasound guidance, a 20 gauge needle was advanced into the renal collecting system. An ultrasound image documentation was performed. Access within the collecting system was confirmed with the efflux of urine followed by limited contrast injection. Over a Nitrex wire, the tract was dilated with an Accustick stent. Next, under  intermittent fluoroscopic guidance and over a short Amplatz wire, the track was dilated ultimately allowing placement of a 10-French percutaneous nephrostomy catheter which was advanced to the level of the renal pelvis where the coil was formed and locked. Contrast was injected and several spot fluoroscopic images were obtained in various obliquities. The catheter was secured at the skin with a Prolene retention suture and stat lock device and connected to a gravity bag was placed. Dressings were applied. The patient tolerated procedure well without immediate postprocedural complication. FINDINGS: Ultrasound scanning demonstrates a moderate to severely dilated right collecting system. Under a combination of ultrasound and fluoroscopic guidance, a posterior inferior calix was targeted allowing placement of a 10-French percutaneous nephrostomy catheter with end coiled and locked within the renal pelvis. Contrast injection confirmed appropriate positioning. Limited contrast injection suggest questioned right proximal ureteral stone likely represents a calcified phlebolith as was suggested on more remote abdominal CT performed 01/02/2019. IMPRESSION: 1. Successful ultrasound and fluoroscopic guided placement of a right sided 10 [place] PCN. 2. Limited contrast injection confirms questioned proximal right ureteral/UPJ stone likely represents a calcified phleboliths as was suggested on more remote abdominal CT 01/02/2019. PLAN: As the etiology of the patient's right-sided urinary obstruction remains indeterminate, the patient may return for right-sided antegrade nephrostogram with potential right-sided ureteral stent placement in 2-3 weeks as indicated. Electronically Signed   By: Sandi Mariscal M.D.   On: 04/19/2020 10:47      Subjective: - no chest pain, shortness of breath, no abdominal pain, nausea or vomiting.   Discharge Exam: BP (!) 162/69 (BP Location: Right Arm)   Pulse 66   Temp 98.4 F (36.9 C) (Oral)    Resp 18   Ht 5\' 2"  (1.575 m)   Wt 71.3 kg   SpO2 94%   BMI 28.75 kg/m   General: Pt is alert, awake, not in acute distress Cardiovascular: RRR, 3/6 SEM, no rubs, no gallops Respiratory: CTA bilaterally, no wheezing, no rhonchi Abdominal: Soft, NT, ND, bowel sounds + Extremities: no edema, no cyanosis    The results of significant diagnostics from this hospitalization (including imaging, microbiology, ancillary and laboratory) are listed below for reference.     Microbiology: Recent Results (from the past 240 hour(s))  Blood Culture (routine x 2)     Status: None   Collection Time: 04/19/20  2:05 AM   Specimen: BLOOD  Result Value Ref Range Status   Specimen Description   Final    BLOOD RIGHT ANTECUBITAL Performed at [facility] [address], [place], [place] [address]    Special Requests   Final    BOTTLES DRAWN AEROBIC AND ANAEROBIC Blood Culture adequate volume Performed at [facility] [address] [place], [place] [address]    Culture   Final    NO GROWTH 5 DAYS Performed at [facility], [address] [place], [place] [address]    Report Status 04/24/2020 FINAL  Final  Blood Culture (routine x 2)     Status: None   Collection Time: 04/19/20  2:14 AM   Specimen: BLOOD  Result Value Ref Range Status   Specimen Description   Final    BLOOD LEFT ANTECUBITAL Performed at [facility] [address] [place], [place] [address]    Special Requests   Final    BOTTLES DRAWN AEROBIC AND ANAEROBIC Blood Culture adequate volume Performed at [facility] [address], [place], [place] [address]    Culture   Final    NO GROWTH 5 DAYS Performed at [facility], [place] [address] [place], [place] [address]    Report Status 04/24/2020 FINAL  Final  Urine culture     Status: None   Collection Time: 04/19/20  4:11 AM   Specimen: In/Out Cath Urine  Result Value Ref Range  Status   Specimen Description   Final    IN/OUT CATH URINE Performed at [facility] [address] [place], [place] [address]    Special Requests   Final    NONE Performed at [facility], [place] [address], [place], [place] [address]    Culture   Final    NO GROWTH Performed at [facility], [place] [address], [place], [place] [address]    Report Status 04/20/2020 FINAL  Final  SARS Coronavirus 2 by RT PCR (hospital order, performed in [facility] hospital lab) Nasopharyngeal Nasopharyngeal Swab     Status: None   Collection Time: 04/19/20  5:43 AM   Specimen: Nasopharyngeal Swab  Result Value Ref Range Status   SARS Coronavirus 2 NEGATIVE NEGATIVE Final    Comment: (NOTE) SARS-CoV-2 target nucleic acids are NOT DETECTED. The SARS-CoV-2 RNA is generally detectable in upper and lower respiratory specimens during the acute phase of infection. The lowest concentration of SARS-CoV-2 viral copies this assay can detect is 250 copies / mL. A negative result does not preclude SARS-CoV-2 infection and should not be used as the sole basis for treatment or other patient management decisions.  A negative result may occur with improper specimen collection / handling, submission of specimen other than nasopharyngeal swab, presence of viral mutation(s) within the areas targeted by this assay, and inadequate number of viral copies (<250 copies / mL). A negative result must be combined with clinical observations, patient history, and epidemiological information. Fact Sheet for Patients:   StrictlyIdeas.no Fact Sheet for Healthcare Providers: BankingDealers.co.za This test is not yet approved or cleared  by the [place] FDA and has been authorized for detection and/or diagnosis of SARS-CoV-2 by FDA under an Emergency Use Authorization (EUA).  This EUA will remain in effect (meaning this test can be used)  for the duration of the COVID-19 declaration under Section 564(b)(1) of the Act, 21 U.S.C. section 360bbb-3(b)(1), unless the authorization is terminated or revoked sooner. Performed at [facility], [place] [address] [place], [place] [address]   Urine culture     Status: Abnormal   Collection Time: 04/19/20  9:49 AM   Specimen: Kidney; Urine  Result Value Ref Range Status   Specimen Description   Final    KIDNEY RIGHT Performed at [facility] [address], [place], [place] [address]    Special Requests   Final    Normal Performed at [facility], [place] [address], [place], [place] [address]    Culture >=100,000 COLONIES/mL ESCHERICHIA COLI (A)  Final   Report Status 04/21/2020 FINAL  Final   Organism ID, Bacteria ESCHERICHIA COLI (A)  Final      Susceptibility   Escherichia coli - MIC*    AMPICILLIN 16 INTERMEDIATE Intermediate     CEFAZOLIN <=4 SENSITIVE Sensitive     CEFEPIME <=1 SENSITIVE Sensitive     CEFTAZIDIME <=1 SENSITIVE Sensitive     CEFTRIAXONE 2 SENSITIVE Sensitive     CIPROFLOXACIN 1 SENSITIVE Sensitive     GENTAMICIN <=1 SENSITIVE Sensitive     IMIPENEM <=0.25 SENSITIVE Sensitive     TRIMETH/SULFA <=20 SENSITIVE Sensitive     AMPICILLIN/SULBACTAM <=2 SENSITIVE Sensitive     * >=100,000 COLONIES/mL ESCHERICHIA COLI  MRSA PCR Screening     Status: None   Collection Time: 04/19/20  5:16 PM   Specimen: Nasal Mucosa; Nasopharyngeal  Result Value Ref Range Status   MRSA by PCR NEGATIVE NEGATIVE Final    Comment:        The GeneXpert MRSA Assay (FDA approved for NASAL specimens only), is one component of a comprehensive MRSA colonization surveillance program. It is not intended to diagnose MRSA infection nor to guide or monitor treatment for MRSA infections. Performed at [facility], [place] [address] [place], [place] [address]   SARS Coronavirus 2 by RT PCR (hospital order,  performed in [facility] hospital lab) Nasopharyngeal Nasopharyngeal Swab     Status: None   Collection Time: 04/25/20  6:57 AM   Specimen: Nasopharyngeal Swab  Result Value Ref Range Status   SARS Coronavirus 2 NEGATIVE NEGATIVE Final    Comment: (NOTE) SARS-CoV-2 target nucleic acids are NOT DETECTED. The SARS-CoV-2 RNA is generally detectable in upper and lower respiratory specimens during the acute phase of infection. The lowest concentration of SARS-CoV-2 viral copies this assay can detect is 250 copies / mL. A negative result does not preclude SARS-CoV-2 infection and should not be used as the sole basis for treatment or other patient management decisions.  A negative result may occur with improper specimen collection / handling, submission of specimen other than nasopharyngeal swab, presence of viral mutation(s) within the areas targeted by this assay, and inadequate number of viral copies (<250 copies / mL). A negative result must be combined with clinical observations, patient history, and epidemiological information. Fact Sheet for Patients:   StrictlyIdeas.no Fact Sheet for Healthcare Providers: BankingDealers.co.za This test is not yet approved or cleared  by the [place] FDA and has been authorized for detection and/or diagnosis of SARS-CoV-2 by FDA under an Emergency Use Authorization (EUA).  This EUA will remain in effect (meaning this test can be used) for the duration of the COVID-19 declaration under Section 564(b)(1) of the Act, 21 U.S.C. section 360bbb-3(b)(1), unless the authorization is terminated or revoked sooner. Performed at [facility], [place] [address] [place],  [address]      Labs: Basic Metabolic Panel: Recent Labs  Lab 04/20/20 0455 04/20/20 0455 04/21/20 0325 04/22/20 [id number] 04/22/20 0903 04/23/20 0350 04/24/20 0423 04/25/20 0427  NA 143   < > 143  --  148* 144 145  146*  K 4.0   < > 3.4*  --  3.7 3.1* 3.0* 3.8  CL 114*   < > 115*  --  121* 119* 115* 115*  CO2 17*   < > 20*  --  21* 21* 22 26  GLUCOSE 108*   < > 129*  --  98 80 85 88  BUN 28*   < > 35*  --  35* 33* 26* 16  CREATININE 1.47*   < > 1.23*  --  1.00 1.01* 0.78 0.81  CALCIUM 6.9*   < > 7.2*  --  7.6* 7.2* 7.0* 7.6*  MG 1.5*  --  3.0* 2.6*  --  2.5*  --   --   PHOS 3.6  --  3.2 2.6  --  2.3*  --   --    < > = values in this interval not displayed.   Liver Function Tests: Recent Labs  Lab 04/19/20 0200 04/19/20 1442 04/20/20 0455 04/21/20 0325 04/24/20 0423  AST 53* 217* 117* 50* 32  ALT 20 77* 18 8 7   ALKPHOS 99 109 95 95 77  BILITOT 0.6 0.5 0.5 0.5 0.6  PROT 5.4* 5.1* 5.1* 5.4* 4.7*  ALBUMIN 2.7* 2.3* 2.3* 2.3* 2.3*   CBC: Recent Labs  Lab 04/19/20 0200 04/19/20 1442 04/21/20 0325 04/22/20 0903 04/23/20 0350 04/24/20 0423 04/25/20 0427  WBC 1.0*   < > 23.1* 22.3* 12.5* 14.5* 13.8*  NEUTROABS 0.7*  --   --   --   --   --   --   HGB 9.1*   < > 7.5* 8.0* 7.3* 8.8* 9.4*  HCT 29.5*   < > 24.2* 25.5* 23.8* 27.1* 29.7*  MCV 101.4*   < > 100.0 100.0 100.4* 97.5 96.7  PLT 131*   < > 136* 164 173 194 226   < > = values in this interval not displayed.   CBG: Recent Labs  Lab 04/19/20 0200  GLUCAP 94   Hgb A1c No results for input(s): HGBA1C in the last 72 hours. Lipid Profile No results for input(s): CHOL, HDL, LDLCALC, TRIG, CHOLHDL, LDLDIRECT in the last 72 hours. Thyroid function studies No results for input(s): TSH, T4TOTAL, T3FREE, THYROIDAB in the last 72 hours.  Invalid input(s): FREET3 Urinalysis    Component Value Date/Time   COLORURINE AMBER (A) 04/19/2020 0200   APPEARANCEUR HAZY (A) 04/19/2020 0200   LABSPEC 1.013 04/19/2020 0200   PHURINE 5.0 04/19/2020 0200   GLUCOSEU NEGATIVE 04/19/2020 0200   HGBUR MODERATE (A) 04/19/2020 0200   BILIRUBINUR NEGATIVE 04/19/2020 0200   KETONESUR NEGATIVE 04/19/2020 0200   PROTEINUR 30 (A) 04/19/2020 0200    UROBILINOGEN 0.2 07/17/2012 1705   NITRITE POSITIVE (A) 04/19/2020 0200   LEUKOCYTESUR MODERATE (A) 04/19/2020 0200    FURTHER DISCHARGE INSTRUCTIONS:   Get Medicines reviewed and adjusted: Please take all your medications with you for your next visit with your Primary MD   Laboratory/radiological data: Please request your Primary MD to go over all hospital tests and procedure/radiological results at the follow up, please ask your Primary MD to get all Hospital records sent to his/her office.   In some cases, they will be blood work, cultures and biopsy results pending at the time of your discharge. Please request that your primary care M.D. goes through all the records of your hospital data and follows up on these results.   Also Note the following: If you experience worsening of your admission symptoms, develop shortness of breath, life threatening emergency, suicidal or homicidal thoughts you must seek medical attention immediately by calling 911 or calling your MD immediately  if symptoms less severe.   You must read complete instructions/literature along with all the possible adverse reactions/side effects for all the Medicines you  take and that have been prescribed to you. Take any new Medicines after you have completely understood and accpet all the possible adverse reactions/side effects.    Do not drive when taking Pain medications or sleeping medications (Benzodaizepines)   Do not take more than prescribed Pain, Sleep and Anxiety Medications. It is not advisable to combine anxiety,sleep and pain medications without talking with your primary care practitioner   Special Instructions: If you have smoked or chewed Tobacco  in the last 2 yrs please stop smoking, stop any regular Alcohol  and or any Recreational drug use.   Wear Seat belts while driving.   Please note: You were cared for by a hospitalist during your hospital stay. Once you are discharged, your primary care physician  will handle any further medical issues. Please note that NO REFILLS for any discharge medications will be authorized once you are discharged, as it is imperative that you return to your primary care physician (or establish a relationship with a primary care physician if you do not have one) for your post hospital discharge needs so that they can reassess your need for medications and monitor your lab values.  Time coordinating discharge: 40 minutes  SIGNED:  Marzetta Board, MD, PhD 04/25/2020, 11:50 AM

## 2020-04-26 ENCOUNTER — Encounter: Payer: Self-pay | Admitting: Internal Medicine

## 2020-04-26 ENCOUNTER — Non-Acute Institutional Stay (SKILLED_NURSING_FACILITY): Payer: Medicare Other | Admitting: Internal Medicine

## 2020-04-26 ENCOUNTER — Encounter: Payer: Self-pay | Admitting: Nurse Practitioner

## 2020-04-26 DIAGNOSIS — N179 Acute kidney failure, unspecified: Secondary | ICD-10-CM | POA: Diagnosis not present

## 2020-04-26 DIAGNOSIS — K5909 Other constipation: Secondary | ICD-10-CM

## 2020-04-26 DIAGNOSIS — N390 Urinary tract infection, site not specified: Secondary | ICD-10-CM

## 2020-04-26 DIAGNOSIS — I5032 Chronic diastolic (congestive) heart failure: Secondary | ICD-10-CM

## 2020-04-26 DIAGNOSIS — I350 Nonrheumatic aortic (valve) stenosis: Secondary | ICD-10-CM | POA: Diagnosis not present

## 2020-04-26 DIAGNOSIS — D500 Iron deficiency anemia secondary to blood loss (chronic): Secondary | ICD-10-CM | POA: Diagnosis not present

## 2020-04-26 DIAGNOSIS — N201 Calculus of ureter: Secondary | ICD-10-CM

## 2020-04-26 LAB — CBC AND DIFFERENTIAL
HCT: 28 — AB (ref 36–46)
Hemoglobin: 9.2 — AB (ref 12.0–16.0)
Neutrophils Absolute: 7447
Platelets: 245 (ref 150–399)
WBC: 10.7

## 2020-04-26 LAB — CBC: RBC: 2.96 — AB (ref 3.87–5.11)

## 2020-04-26 NOTE — Assessment & Plan Note (Signed)
Will complete 8 more days of Keflex 500mg  qid

## 2020-04-26 NOTE — Assessment & Plan Note (Signed)
S/p percutaneous nephrectomy R, f/u urology.

## 2020-04-26 NOTE — Assessment & Plan Note (Signed)
Blood pressure is controlled

## 2020-04-26 NOTE — Assessment & Plan Note (Signed)
04/26/20 wbc 10.7

## 2020-04-26 NOTE — Progress Notes (Signed)
Provider:  [name], MD Location:  [facility] Room Number: 45 Place of Service:  SNF ([phone])  PCP: [name], MD Patient Care Team: [name], MD as PCP - General (Internal Medicine) [name], MD as PCP - Cardiology (Cardiology) [name], MD (Neurosurgery) [name], MD (Neurology) [name], MD (Inactive) as Consulting Physician (Gastroenterology) [name], MD as Consulting Physician (Urology) [name], Man X, NP as Nurse Practitioner (Internal Medicine)  Extended Emergency Contact Information Primary Emergency Contact: [facility] Address: [address]          [place], [place] [address] [name] of [place] Phone: [phone] Mobile Phone: [phone] Relation: Spouse Secondary Emergency Contact: [name] Mobile Phone: [phone] Relation: Daughter Preferred language: [name] Interpreter needed? No  Code Status: FULL CODE Goals of Care: Advanced Directive information Advanced Directives 04/19/2020  Does Patient Have a Medical Advance Directive? Yes  Type of Advance Directive Living will  Does patient want to make changes to medical advance directive? No - Patient declined  Copy of [place] in Chart? -  Would patient like information on creating a medical advance directive? -  Pre-existing out of facility DNR order (yellow form or pink MOST form) -      Chief Complaint  Patient presents with  . Readmit To SNF    Readmission to [facility]    HPI: Patient is an 86 y.o. female seen today for admission to  [facility] [place] term Resident of facility  Patient has history of Parkinson disease with visual hallucinations,Erosive Arthritis,peripheral neuropathy, hypothyroid, GERD, osteoporosis, hyperlipidemia, gout, dyspnea on exertionand Aortic stenosis  Had developed fever with Leucocytosis in the facility. UA positive started on  Levaquin Switched to Macrodantin when Culture came back. But patient continued to not do well. Spiked a temp of 102. Send to ED. Had obstructing stone in right ureter, bilateral nephrolithiasis She had a nephrostomy tube placed to relieve the obstruction She was also uroseptic and had to be treated with pressors and high dose of broad-spectrum antibiotics. Eventually patient improved and was discharged back to the facility.  She still has the nephrostomy tube. She also had anemia with hemoglobin dropping down to 7.3 and received transfusion. Her only complaint today was constipation.  Denies any abdominal pain no nausea or vomiting.  No shortness of breath or cough.  No fever no chills  Past Medical History:  Diagnosis Date  . Anemia   . Anxiety   . Aortic stenosis    moderate by echo 2021   . Arthritis   . Broken arm    right   . Chronic diastolic CHF (congestive heart failure) ([place])   . Family history of adverse reaction to anesthesia    pt. states sister vomits  . Fibromyalgia   . Fracture of thumb 09/01/2015   right  . Fracture, foot 09/01/2015   right  . GERD (gastroesophageal reflux disease)   . H/O hiatal hernia   . History of bronchitis   . History of kidney stones   . Hypercholesterolemia   . Hypertension    dr t turner  . Hypothyroidism   . Neuropathy   . Osteoporosis   . Parkinson disease ([place])   . PVD (peripheral vascular disease) ([facility])    99% stenosis of left anteiror tibial artery, mod stenosis of left distal SFA and popliteal artery followed by [name]  . RBBB    noted on  EKG 2018  . Restless leg   . Shortness of breath    with exertion - chronic   . Sjogren's disease ([place])   . SOB (shortness of breath)    chronic due to diastolic dysfunction, deconditioning, obesity  . Tremor    Past Surgical History:  Procedure Laterality Date  . ABDOMINAL HYSTERECTOMY    . BACK SURGERY     4 back surgeries,   lumbar fusion  . CARDIAC CATHETERIZATION  2006    normal  . CYSTOSCOPY/URETEROSCOPY/HOLMIUM LASER/STENT PLACEMENT Left 01/06/2019   Procedure: CYSTOSCOPY/RETROGRADE/URETEROSCOPY/HOLMIUM LASER/BASKET RETRIEVAL/STENT PLACEMENT;  Surgeon: Ceasar Mons, MD;  Location: [facility];  Service: Urology;  Laterality: Left;  . EYE SURGERY Bilateral    cateracts  . falls     variious fall, broken wrist,and toes  . FRACTURE SURGERY Right April 2016   Wrist, Pt. fell  . IR NEPHROSTOMY PLACEMENT RIGHT  04/19/2020  . kidney stone removal Left 01/20/2019  . RIGHT/LEFT HEART CATH AND CORONARY ANGIOGRAPHY N/A 02/05/2018   Procedure: RIGHT/LEFT HEART CATH AND CORONARY ANGIOGRAPHY;  Surgeon: Troy Sine, MD;  Location: [facility];  Service: Cardiovascular;  Laterality: N/A;  . SPINAL CORD STIMULATOR INSERTION N/A 09/09/2015   Procedure: LUMBAR SPINAL CORD STIMULATOR INSERTION;  Surgeon: Clydell Hakim, MD;  Location: [facility];  Service: Neurosurgery;  Laterality: N/A;  LUMBAR SPINAL CORD STIMULATOR INSERTION  . SPINE SURGERY  April 2013   Back X's 4  . TOTAL HIP ARTHROPLASTY     right  . WRIST FRACTURE SURGERY Bilateral     reports that she has never smoked. She has never used smokeless tobacco. She reports that she does not drink alcohol or use drugs. Social History   Socioeconomic History  . Marital status: Married    Spouse name: [name]  . Number of children: 2  . Years of education: HS  . Highest education level: Not on file  Occupational History    Comment: Homemaker  Tobacco Use  . Smoking status: Never Smoker  . Smokeless tobacco: Never Used  Substance and Sexual Activity  . Alcohol use: No    Alcohol/week: 0.0 standard drinks  . Drug use: No  . Sexual activity: Never  Other Topics Concern  . Not on file  Social History Narrative   Patient lives at home with her spouse. Married in 1957. Two people lives in the home, no pets.    Diet: Lactose Intolerance    Prior profession: Home Maker, Exercise: Yes, but not a  lot.    Caffeine Use: tea   Social Determinants of Health   Financial Resource Strain:   . Difficulty of Paying Living Expenses:   Food Insecurity:   . Worried About Charity fundraiser in the Last Year:   . Arboriculturist in the Last Year:   Transportation Needs:   . Film/video editor (Medical):   Marland Kitchen Lack of Transportation (Non-Medical):   Physical Activity:   . Days of Exercise per Week:   . Minutes of Exercise per Session:   Stress:   . Feeling of Stress :   Social Connections:   . Frequency of Communication with Friends and Family:   . Frequency of Social Gatherings with Friends and Family:   . Attends Religious Services:   . Active Member of Clubs or Organizations:   . Attends Archivist Meetings:   Marland Kitchen Marital Status:   Intimate Partner Violence:   . Fear of Current  or Ex-Partner:   . Emotionally Abused:   Marland Kitchen Physically Abused:   . Sexually Abused:     Functional Status Survey:    Family History  Problem Relation Age of Onset  . Heart attack Mother   . Restless legs syndrome Mother   . Heart failure Mother   . Heart disease Mother   . Hypertension Mother   . COPD Father     Health Maintenance  Topic Date Due  . INFLUENZA VACCINE  07/10/2020  . TETANUS/TDAP  08/31/2025  . DEXA SCAN  Completed  . COVID-19 Vaccine  Completed  . PNA vac Low Risk Adult  Completed    Allergies  Allergen Reactions  . Adrenalone   . Lactose Other (See Comments)    abd pain, lactose intolerant  . Morphine Other (See Comments)  . Sulfa Antibiotics Other (See Comments)    Headache, very sick  . Codeine Other (See Comments)    Reaction:  Headaches and nightmares   . Lactose Intolerance (Gi) Nausea And Vomiting  . Latex Rash  . Lyrica [Pregabalin] Swelling and Other (See Comments)    Reaction:  Leg swelling  . Other Other (See Comments)    Pt states that pain medications give her nightmares.    . Plaquenil [Hydroxychloroquine Sulfate] Other (See Comments)     Reaction:  GI upset   . Reglan [Metoclopramide] Other (See Comments)    Reaction:  GI upset   . Requip [Ropinirole Hcl] Other (See Comments)    Reaction:  GI upset   . Septra [Sulfamethoxazole-Trimethoprim] Nausea And Vomiting  . Shellfish Allergy Nausea And Vomiting    Outpatient Encounter Medications as of 04/26/2020  Medication Sig  . acetaminophen (TYLENOL) 500 MG tablet Take 500 mg by mouth daily as needed for mild pain.   Marland Kitchen acetaminophen (TYLENOL) 500 MG tablet Take 500 mg by mouth 2 (two) times daily.  Marland Kitchen albuterol (PROVENTIL HFA;VENTOLIN HFA) 108 (90 Base) MCG/ACT inhaler Inhale 2 puffs into the lungs every 6 (six) hours as needed for wheezing or shortness of breath.  Marland Kitchen aspirin EC 81 MG tablet Take 81 mg by mouth daily.   . Calcium Carb-Cholecalciferol (CALCIUM 600/VITAMIN D3) 600-800 MG-UNIT TABS Take 1 tablet by mouth daily.  . carbidopa-levodopa (SINEMET IR) 25-100 MG tablet Take 2 tablets by mouth in the morning and at bedtime.   . cephALEXin (KEFLEX) 500 MG capsule Take 1 capsule (500 mg total) by mouth 4 (four) times daily for 8 days.  . Cholecalciferol (VITAMIN D) 2000 units CAPS Take 2,000 Units by mouth daily.  . clotrimazole-betamethasone (LOTRISONE) cream Apply 1 application topically 2 (two) times daily.  Marland Kitchen docusate sodium (COLACE) 100 MG capsule Take 100 mg by mouth at bedtime.  Marland Kitchen ezetimibe (ZETIA) 10 MG tablet TAKE 1 TABLET ONCE DAILY.  Marland Kitchen Ferrous Sulfate (SLOW FE) 142 (45 Fe) MG TBCR Take 1 tablet by mouth. Daily  . hydrocortisone 2.5 % cream Apply 1 application topically 2 (two) times daily.  . hydroxychloroquine (PLAQUENIL) 200 MG tablet Take 200 mg by mouth daily.  . lansoprazole (PREVACID SOLUTAB) 30 MG disintegrating tablet Take 30 mg by mouth daily at 12 noon.  Marland Kitchen levothyroxine (SYNTHROID, LEVOTHROID) 50 MCG tablet Take 50 mcg by mouth daily before breakfast.   . lubiprostone (AMITIZA) 24 MCG capsule Take 24 mcg by mouth 2 (two) times daily with a meal.   .  Multiple Vitamin (MULTIVITAMIN WITH MINERALS) TABS tablet Take 1 tablet by mouth daily.  . Multiple Vitamins-Minerals (PRESERVISION  AREDS 2) CHEW Chew 1 tablet by mouth daily.   Marland Kitchen nystatin (MYCOSTATIN/NYSTOP) powder Apply topically 2 (two) times daily as needed.  Vladimir Faster Glycol-Propyl Glycol (SYSTANE) 0.4-0.3 % SOLN Apply 1 drop to eye 2 (two) times daily as needed (dry eyes).   . pramipexole (MIRAPEX) 1 MG tablet Take 1 mg by mouth 2 (two) times daily.  . QUEtiapine (SEROQUEL) 25 MG tablet Take 12.5-25 mg by mouth See admin instructions. 1/2 tablet to = 12.5 mg QAM, 1 tablet qpm  . rosuvastatin (CRESTOR) 20 MG tablet Take 1 tablet (20 mg total) by mouth daily.  Marland Kitchen saccharomyces boulardii (FLORASTOR) 250 MG capsule Take 250 mg by mouth 2 (two) times daily.  Marland Kitchen senna-docusate (SENOKOT-S) 8.6-50 MG tablet Take 1 tablet by mouth at bedtime.   . sertraline (ZOLOFT) 50 MG tablet Take 75 mg by mouth at bedtime. 1-1/2 tablets to = 75 mg  . Skin Protectants, Misc. (EUCERIN) cream Apply 1 application topically daily.   Marland Kitchen Spacer/Aero-Holding Chambers (AEROCHAMBER MV) inhaler by Other route. Use as instructed as needed  . [DISCONTINUED] nitrofurantoin, macrocrystal-monohydrate, (MACROBID) 100 MG capsule Take 100 mg by mouth 2 (two) times daily.  . [DISCONTINUED] predniSONE (DELTASONE) 1 MG tablet Take 3 tablets (3 mg total) by mouth daily with breakfast for 4 days. 3 tabs x5 days (Patient taking differently: Take 3 mg by mouth daily with breakfast. 3 tabs x 3 days)  . [DISCONTINUED] predniSONE (DELTASONE) 20 MG tablet Take 1 tablet (20 mg total) by mouth daily with breakfast.  . [DISCONTINUED] QUEtiapine (SEROQUEL) 25 MG tablet Take 12.5-25 mg by mouth See admin instructions. Take 12.5 mg in Am and 25 mg in PM  . [DISCONTINUED] sertraline (ZOLOFT) 50 MG tablet Take 75 mg by mouth daily. Take 1.5 tablets to = 75 mg   No facility-administered encounter medications on file as of 04/26/2020.    Review of  Systems  Constitutional: Positive for activity change and appetite change.  HENT: Negative.   Respiratory: Negative.   Cardiovascular: Positive for leg swelling.  Gastrointestinal: Positive for constipation.  Genitourinary: Negative for dysuria, pelvic pain and urgency.  Musculoskeletal: Positive for gait problem.  Skin: Negative.   Neurological: Positive for weakness.  Psychiatric/Behavioral: Negative.     Vitals:   04/26/20 1349  BP: 120/80  Pulse: 70  Resp: 18  Temp: 98.1 F (36.7 C)  TempSrc: Oral  SpO2: 95%  Weight: 144 lb 6.4 oz (65.5 kg)  Height: 5\' 2"  (1.575 m)   Body mass index is 26.41 kg/m. Physical Exam  Constitutional: Oriented to person, place, and time. Well-developed and well-nourished.  HENT:  Head: Normocephalic.  Mouth/Throat: Oropharynx is clear and moist.  Eyes: Pupils are equal, round, and reactive to light.  Neck: Neck supple.  Cardiovascular: Normal rate and normal heart sounds.  [place] Present Pulmonary/Chest: Effort normal and breath sounds normal. No respiratory distress. No wheezes. Few rales in Left Lower lung Abdominal: Soft. Bowel sounds are normal. No distension. There is no tenderness. There is no rebound.  Musculoskeletal: Mild edema  Lymphadenopathy: none Neurological: Alert and oriented to person, place, and time.  Skin: Skin is warm and dry.  Psychiatric: Normal mood and affect. Behavior is normal. Thought content normal.    Labs reviewed: Basic Metabolic Panel: Recent Labs    04/21/20 0325 04/22/20 0605 04/22/20 0903 04/23/20 0350 04/24/20 0423 04/25/20 0427  NA 143  --    < > 144 145 146*  K 3.4*  --    < >  3.1* 3.0* 3.8  CL 115*  --    < > 119* 115* 115*  CO2 20*  --    < > 21* 22 26  GLUCOSE 129*  --    < > 80 85 88  BUN 35*  --    < > 33* 26* 16  CREATININE 1.23*  --    < > 1.01* 0.78 0.81  CALCIUM 7.2*  --    < > 7.2* 7.0* 7.6*  MG 3.0* 2.6*  --  2.5*  --   --   PHOS 3.2 2.6  --  2.3*  --   --    < > = values  in this interval not displayed.   Liver Function Tests: Recent Labs    04/20/20 0455 04/21/20 0325 04/24/20 0423  AST 117* 50* 32  ALT 18 8 7   ALKPHOS 95 95 77  BILITOT 0.5 0.5 0.6  PROT 5.1* 5.4* 4.7*  ALBUMIN 2.3* 2.3* 2.3*   No results for input(s): LIPASE, AMYLASE in the last 8760 hours. No results for input(s): AMMONIA in the last 8760 hours. CBC: Recent Labs    02/12/20 0000 03/31/20 0000 04/19/20 0200 04/19/20 1442 04/23/20 0350 04/23/20 0350 04/24/20 0423 04/25/20 0427 04/26/20 0000  WBC 7.5   < > 1.0*   < > 12.5*   < > 14.5* 13.8* 10.7  NEUTROABS 4,845  --  0.7*  --   --   --   --   --  7,447  HGB 11.0*   < > 9.1*   < > 7.3*   < > 8.8* 9.4* 9.2*  HCT 33*   < > 29.5*   < > 23.8*   < > 27.1* 29.7* 28*  MCV  --   --  101.4*   < > 100.4*  --  97.5 96.7  --   PLT  --    < > 131*   < > 173   < > 194 226 245   < > = values in this interval not displayed.   Cardiac Enzymes: No results for input(s): CKTOTAL, CKMB, CKMBINDEX, TROPONINI in the last 8760 hours. BNP: Invalid input(s): POCBNP No results found for: HGBA1C Lab Results  Component Value Date   TSH 1.62 09/08/2019   No results found for: VITAMINB12 No results found for: FOLATE No results found for: IRON, TIBC, FERRITIN  Imaging and Procedures obtained prior to SNF admission: DG Chest 1 View  Result Date: 04/19/2020 CLINICAL DATA:  Fever, tachypnea EXAM: CHEST  1 VIEW COMPARISON:  04/19/2020 FINDINGS: Heart is normal size. Mediastinal contours within normal limits. Spinal stimulator wires remain in stable position. No confluent airspace opacities or edema. Possible trace bilateral effusions. No acute bony abnormality. IMPRESSION: Possible trace bilateral effusions. Electronically Signed   By: Rolm Baptise M.D.   On: 04/19/2020 17:42   DG Chest Port 1 View  Result Date: 04/19/2020 CLINICAL DATA:  Shortness of breath and fever EXAM: PORTABLE CHEST 1 VIEW COMPARISON:  Chest radiograph 01/15/2019 FINDINGS:  The heart size and mediastinal contours are within normal limits. Both lungs are clear. The visualized skeletal structures are unremarkable. Spinal stimulator leads overlie the midthoracic spine. IMPRESSION: No active disease. Electronically Signed   By: Ulyses Jarred M.D.   On: 04/19/2020 02:35   ECHOCARDIOGRAM COMPLETE  Result Date: 04/20/2020    ECHOCARDIOGRAM REPORT   Patient Name:   [name] Date of Exam: 04/20/2020 Medical Rec #:  [id number]  Height:       62.0 in Accession #:    [id number]        Weight:       157.2 lb Date of Birth:  08-Sep-1931        BSA:          1.726 m Patient Age:    86 years          BP:           99/58 mmHg Patient Gender: F                 HR:           85 bpm. Exam Location:  Inpatient Procedure: 2D Echo, Color Doppler and Cardiac Doppler Indications:    Acute Respiratory Insufficiency R06.89  History:        Patient has prior history of Echocardiogram examinations, most                 recent 12/30/2019. CHF; Risk Factors:Hypertension and                 Dyslipidemia.  Sonographer:    Raquel Sarna Senior RDCS Referring Phys: [place]  1. Since the last study on 12/30/2019 aortic stenosis is now severe - peak/mean transaortic gradients 66/41 mmHg and dimensionless index 0.21. Referral for a TAVR procedure should be considered.  2. Left ventricular ejection fraction, by estimation, is 60 to 65%. The left ventricle has normal function. The left ventricle has no regional wall motion abnormalities. There is mild concentric left ventricular hypertrophy. Left ventricular diastolic parameters are consistent with Grade I diastolic dysfunction (impaired relaxation). Elevated left atrial pressure.  3. Right ventricular systolic function is normal. The right ventricular size is normal.  4. The mitral valve is normal in structure. Mild mitral valve regurgitation. No evidence of mitral stenosis.  5. The aortic valve is normal in structure. Aortic valve  regurgitation is not visualized. Severe aortic valve stenosis. Aortic valve mean gradient measures 41.0 mmHg.  6. The inferior vena cava is normal in size with greater than 50% respiratory variability, suggesting right atrial pressure of 3 mmHg. FINDINGS  Left Ventricle: Left ventricular ejection fraction, by estimation, is 60 to 65%. The left ventricle has normal function. The left ventricle has no regional wall motion abnormalities. The left ventricular internal cavity size was normal in size. There is  mild concentric left ventricular hypertrophy. Left ventricular diastolic parameters are consistent with Grade I diastolic dysfunction (impaired relaxation). Elevated left atrial pressure. Right Ventricle: The right ventricular size is normal. No increase in right ventricular wall thickness. Right ventricular systolic function is normal. Left Atrium: Left atrial size was normal in size. Right Atrium: Right atrial size was normal in size. Pericardium: There is no evidence of pericardial effusion. Mitral Valve: The mitral valve is normal in structure. There is mild thickening of the mitral valve leaflet(s). There is mild calcification of the mitral valve leaflet(s). Normal mobility of the mitral valve leaflets. Moderate mitral annular calcification. Mild mitral valve regurgitation. No evidence of mitral valve stenosis. Tricuspid Valve: The tricuspid valve is normal in structure. Tricuspid valve regurgitation is mild . No evidence of tricuspid stenosis. Aortic Valve: The aortic valve is normal in structure.. There is severe thickening and severe calcifcation of the aortic valve. Aortic valve regurgitation is not visualized. Severe aortic stenosis is present. There is severe thickening of the aortic valve. There is severe calcifcation of the aortic valve. Aortic valve mean gradient  measures 41.0 mmHg. Aortic valve peak gradient measures 65.3 mmHg. Aortic valve area, by VTI measures 0.85 cm. Pulmonic Valve: The pulmonic  valve was normal in structure. Pulmonic valve regurgitation is not visualized. No evidence of pulmonic stenosis. Aorta: The aortic root is normal in size and structure. Venous: The inferior vena cava is normal in size with greater than 50% respiratory variability, suggesting right atrial pressure of 3 mmHg. IAS/Shunts: No atrial level shunt detected by color flow Doppler.  LEFT VENTRICLE PLAX 2D LVIDd:         3.00 cm  Diastology LVIDs:         2.50 cm  LV e' lateral:   6.64 cm/s LV PW:         1.10 cm  LV E/e' lateral: 20.2 LV IVS:        1.10 cm  LV e' medial:    4.90 cm/s LVOT diam:     2.10 cm  LV E/e' medial:  27.3 LV SV:         88 LV SV Index:   51 LVOT Area:     3.46 cm  RIGHT VENTRICLE RV S prime:     6.96 cm/s TAPSE (M-mode): 2.2 cm LEFT ATRIUM             Index LA diam:        3.80 cm 2.20 cm/m LA Vol (A2C):   61.6 ml 35.70 ml/m LA Vol (A4C):   76.9 ml 44.56 ml/m LA Biplane Vol: 75.2 ml 43.58 ml/m  AORTIC VALVE AV Area (Vmax):    0.72 cm AV Area (Vmean):   0.73 cm AV Area (VTI):     0.85 cm AV Vmax:           404.00 cm/s AV Vmean:          299.000 cm/s AV VTI:            1.030 m AV Peak Grad:      65.3 mmHg AV Mean Grad:      41.0 mmHg LVOT Vmax:         84.20 cm/s LVOT Vmean:        63.400 cm/s LVOT VTI:          0.254 m LVOT/AV VTI ratio: 0.25  AORTA Ao Root diam: 3.40 cm Ao Asc diam:  3.10 cm MITRAL VALVE MV Area (PHT): 3.72 cm     SHUNTS MV Decel Time: 204 msec     Systemic VTI:  0.25 m MV E velocity: 134.00 cm/s  Systemic Diam: 2.10 cm MV A velocity: 131.00 cm/s MV E/A ratio:  1.02 Ena Dawley MD Electronically signed by Ena Dawley MD Signature Date/Time: 04/20/2020/11:59:59 AM    Final    CT RENAL STONE STUDY  Result Date: 04/19/2020 CLINICAL DATA:  Severe sepsis.  Rule out hydronephrosis EXAM: CT ABDOMEN AND PELVIS WITHOUT CONTRAST TECHNIQUE: Multidetector CT imaging of the abdomen and pelvis was performed following the standard protocol without IV contrast. COMPARISON:   01/02/2019 FINDINGS: Lower chest: Trace pleural effusions and lower lobe atelectasis. Extensive coronary atherosclerosis. Small hiatal hernia. Hepatobiliary: No focal liver abnormality.No evidence of biliary obstruction or stone. Pancreas: Mild generalized atrophy. Spleen: Unremarkable. Adrenals/Urinary Tract: Negative adrenals. Right hydroureteronephrosis due to a obstructing 11 x 7 mm stone at the pelvic inlet. Irregular 7 mm interpolar calculus in the left kidney. Two small lower pole calculi on the right. Unremarkable bladder. Stomach/Bowel:  No obstruction. Moderate rectal stool. Vascular/Lymphatic: Atherosclerosis no acute vascular  abnormality. No mass or adenopathy. Reproductive:Hysterectomy Other: No ascites or pneumoperitoneum. Musculoskeletal: Lumbar fusion from L2-S1. Severe adjacent segment degeneration at T12-L1 and especially L1-2 where there is retrolisthesis and spinal/foraminal impingement. A spinal stimulator is present. IMPRESSION: 1. Obstructing 11 x 7 mm stone in the right ureter at the level of the pelvic inlet. 2. Bilateral nephrolithiasis. 3. Incidental findings are noted above. Electronically Signed   By: Monte Fantasia M.D.   On: 04/19/2020 06:29   IR NEPHROSTOMY PLACEMENT RIGHT  Result Date: 04/19/2020 INDICATION: Urinary sepsis secondary to right-sided hydronephrosis. EXAM: 1. ULTRASOUND GUIDANCE FOR PUNCTURE OF THE RIGHT RENAL COLLECTING SYSTEM 2. RIGHT PERCUTANEOUS NEPHROSTOMY TUBE PLACEMENT. COMPARISON:  CT abdomen pelvis-earlier same day; 01/02/2019 MEDICATIONS: Rocephin 1 gm IV; The antibiotic was administered in an appropriate time frame prior to skin puncture. ANESTHESIA/SEDATION: Moderate (conscious) sedation was employed during this procedure. A total of Versed 0.5 mg was administered intravenously. Moderate Sedation Time: 14 minutes. The patient's level of consciousness and vital signs were monitored continuously by radiology nursing throughout the procedure under my direct  supervision. CONTRAST:  10 mL Isovue 300 - administered into the renal collecting system FLUOROSCOPY TIME:  1 minute, 36 seconds (79 mGy) COMPLICATIONS: None immediate. PROCEDURE: The procedure, risks, benefits, and alternatives were explained to the patient. Questions regarding the procedure were encouraged and answered. The patient understands and consents to the procedure. A timeout was performed prior to the initiation of the procedure. The right flank region was prepped and draped in the usual sterile fashion and a sterile drape was applied covering the operative field. A sterile gown and sterile gloves were used for the procedure. Local anesthesia was provided with 1% Lidocaine with epinephrine. Ultrasound was used to localize the right kidney. Under direct ultrasound guidance, a 20 gauge needle was advanced into the renal collecting system. An ultrasound image documentation was performed. Access within the collecting system was confirmed with the efflux of urine followed by limited contrast injection. Over a Nitrex wire, the tract was dilated with an Accustick stent. Next, under intermittent fluoroscopic guidance and over a short Amplatz wire, the track was dilated ultimately allowing placement of a 10-French percutaneous nephrostomy catheter which was advanced to the level of the renal pelvis where the coil was formed and locked. Contrast was injected and several spot fluoroscopic images were obtained in various obliquities. The catheter was secured at the skin with a Prolene retention suture and stat lock device and connected to a gravity bag was placed. Dressings were applied. The patient tolerated procedure well without immediate postprocedural complication. FINDINGS: Ultrasound scanning demonstrates a moderate to severely dilated right collecting system. Under a combination of ultrasound and fluoroscopic guidance, a posterior inferior calix was targeted allowing placement of a 10-French percutaneous  nephrostomy catheter with end coiled and locked within the renal pelvis. Contrast injection confirmed appropriate positioning. Limited contrast injection suggest questioned right proximal ureteral stone likely represents a calcified phlebolith as was suggested on more remote abdominal CT performed 01/02/2019. IMPRESSION: 1. Successful ultrasound and fluoroscopic guided placement of a right sided 10 [place] PCN. 2. Limited contrast injection confirms questioned proximal right ureteral/UPJ stone likely represents a calcified phleboliths as was suggested on more remote abdominal CT 01/02/2019. PLAN: As the etiology of the patient's right-sided urinary obstruction remains indeterminate, the patient may return for right-sided antegrade nephrostogram with potential right-sided ureteral stent placement in 2-3 weeks as indicated. Electronically Signed   By: Sandi Mariscal M.D.   On: 04/19/2020 10:47  Assessment/Plan  Obstruction of right ureteropelvic junction due to stone S/P Nephrostomy tube Follow up with urology Urinary tract infection without hematuria, site unspecified On Keflex for 8 more days Chronic constipation Increase Her Amitiza to 24 mcg BID Check For Impaction Nonrheumatic aortic valve stenosis Worsening per ECHO Needs Cardiology Follow up soon  Chronic diastolic CHF (congestive heart failure) (HCC) Of Lasix  Continue to monitor Acute kidney injury ([place]) Repeat BMP  Iron deficiency anemia due to chronic blood loss On Iron S/P Transfusion Repeat CBC Parkinson disease Doing well on Sinemet and Mirapex Has restarted working with therapy Visual hallucinations On Seroquel Stable Erosive osteoarthritis On Plaquenil Prednisone discontinued  Hypothyroidism, unspecified type TSH Normal in  Depression, unspecified depression type Stable on Zoloft Seen by Facility Nurse  Osteoporosis Up to date on [employer] it out of facility Hyperlipidemia LDL 32 On  Crestor Onychomycosis of Toes On Lotrisone per [employer] staff Communication:   Labs/tests ordered:CMP and CBC  Total time spent in this patient care encounter was  45_  minutes; greater than 50% of the visit spent counseling patient and staff, reviewing records , Labs and coordinating care for problems addressed at this encounter.

## 2020-04-26 NOTE — Assessment & Plan Note (Signed)
Stable, continue Prevacid.  

## 2020-04-26 NOTE — Assessment & Plan Note (Signed)
Resolved, continue Quetiapine.

## 2020-04-26 NOTE — Assessment & Plan Note (Signed)
Compensated, continue Furosemide.  

## 2020-04-26 NOTE — Assessment & Plan Note (Signed)
F/u cardiology 

## 2020-04-26 NOTE — Assessment & Plan Note (Signed)
S/p one unit PRBC transfusion, Hgb improved from 7.3 to 9.2, continue Fe, f/u CBC

## 2020-04-26 NOTE — Assessment & Plan Note (Signed)
Resolved

## 2020-04-26 NOTE — Assessment & Plan Note (Signed)
Chronic, f/u rheumatology.

## 2020-04-26 NOTE — Assessment & Plan Note (Signed)
Her mood is stable, continue Sertraline, Quetiapine.  

## 2020-04-26 NOTE — Assessment & Plan Note (Signed)
Stable, continue Levothyroxine 66mcg qd, TSH 1.62 09/08/19

## 2020-04-26 NOTE — Assessment & Plan Note (Signed)
Resume Amitiza, continue Colace qd, Senokot S qd.

## 2020-04-26 NOTE — Assessment & Plan Note (Addendum)
F/u rheumatology, Sjogren's, continue Plaquenil, Prednisone, Tylenol.

## 2020-04-26 NOTE — Assessment & Plan Note (Signed)
Stable, continue Sinemet, Mirapex.

## 2020-04-29 ENCOUNTER — Encounter: Payer: Self-pay | Admitting: Internal Medicine

## 2020-04-29 ENCOUNTER — Non-Acute Institutional Stay (SKILLED_NURSING_FACILITY): Payer: Medicare Other | Admitting: Internal Medicine

## 2020-04-29 DIAGNOSIS — R600 Localized edema: Secondary | ICD-10-CM

## 2020-04-29 DIAGNOSIS — I5032 Chronic diastolic (congestive) heart failure: Secondary | ICD-10-CM | POA: Diagnosis not present

## 2020-04-29 DIAGNOSIS — I350 Nonrheumatic aortic (valve) stenosis: Secondary | ICD-10-CM

## 2020-04-29 DIAGNOSIS — N390 Urinary tract infection, site not specified: Secondary | ICD-10-CM | POA: Diagnosis not present

## 2020-04-29 NOTE — Progress Notes (Signed)
Location:   [facility] Room Number: 45 Place of Service:  SNF [phone]) Provider:  [name] MD   [name], MD  Patient Care Team: [name], MD as PCP - General (Internal Medicine) [name], MD as PCP - Cardiology (Cardiology) [name], MD (Neurosurgery) [name], MD (Neurology) [name], MD (Inactive) as Consulting Physician (Gastroenterology) [name], MD as Consulting Physician (Urology) [name], Man X, NP as Nurse Practitioner (Internal Medicine)  Extended Emergency Contact Information Primary Emergency Contact: [facility] Address: [address]          [place], [place] [address] [name] of [place] Phone: [phone] Mobile Phone: [phone] Relation: Spouse Secondary Emergency Contact: [name] Mobile Phone: [phone] Relation: Daughter Preferred language: [name] Interpreter needed? No  Code Status:  Full Code Goals of care: Advanced Directive information Advanced Directives 04/19/2020  Does Patient Have a Medical Advance Directive? Yes  Type of Advance Directive Living will  Does patient want to make changes to medical advance directive? No - Patient declined  Copy of [place] in Chart? -  Would patient like information on creating a medical advance directive? -  Pre-existing out of facility DNR order (yellow form or pink MOST form) -     Chief Complaint  Patient presents with  . Acute Visit    edema LE    HPI:  Pt is a 86 y.o. female seen today for an acute visit for lower extremity edema, shortness of breath, bruise noted on her left thigh  Patient has history of Parkinson disease with visual hallucinations,Erosive Arthritis,peripheral neuropathy, hypothyroid, GERD, osteoporosis, hyperlipidemia, gout, dyspnea on exertionand Aortic stenosis Recent admit from 5/11-5/17 for septic shock due to UTI and obstructive right kidney  stone.  Bilateral LE edema Patient does have a history of diastolic CHF Has been only 6 pounds since been in the facility.  Noticed worsening of lower extremity edema with mild shortness of breath Has history of Severe AS Denies any chest pain Bruises of left thigh as noticed by the nurse question Patient denies any pain or recent injury  No fever or chills or any other complaints today Past Medical History:  Diagnosis Date  . Anemia   . Anxiety   . Aortic stenosis    moderate by echo 2021   . Arthritis   . Broken arm    right   . Chronic diastolic CHF (congestive heart failure) ([facility])   . Family history of adverse reaction to anesthesia    pt. states sister vomits  . Fibromyalgia   . Fracture of thumb 09/01/2015   right  . Fracture, foot 09/01/2015   right  . GERD (gastroesophageal reflux disease)   . H/O hiatal hernia   . History of bronchitis   . History of kidney stones   . Hypercholesterolemia   . Hypertension    dr t turner  . Hypothyroidism   . Neuropathy   . Osteoporosis   . Parkinson disease ([facility])   . PVD (peripheral vascular disease) ([facility])    99% stenosis of left anteiror tibial artery, mod stenosis of left distal SFA and popliteal artery followed by [name]  . RBBB    noted on EKG 2018  . Restless leg   . Shortness of breath    with exertion - chronic   . Sjogren's disease ([place] [facility])   . SOB (shortness of breath)    chronic due to diastolic dysfunction, deconditioning,  obesity  . Tremor    Past Surgical History:  Procedure Laterality Date  . ABDOMINAL HYSTERECTOMY    . BACK SURGERY     4 back surgeries,   lumbar fusion  . CARDIAC CATHETERIZATION  2006   normal  . CYSTOSCOPY/URETEROSCOPY/HOLMIUM LASER/STENT PLACEMENT Left 01/06/2019   Procedure: CYSTOSCOPY/RETROGRADE/URETEROSCOPY/HOLMIUM LASER/BASKET RETRIEVAL/STENT PLACEMENT;  Surgeon: Ceasar Mons, MD;  Location: [facility];  Service: Urology;  Laterality: Left;  . EYE SURGERY Bilateral     cateracts  . falls     variious fall, broken wrist,and toes  . FRACTURE SURGERY Right April 2016   Wrist, Pt. fell  . IR NEPHROSTOMY PLACEMENT RIGHT  04/19/2020  . kidney stone removal Left 01/20/2019  . RIGHT/LEFT HEART CATH AND CORONARY ANGIOGRAPHY N/A 02/05/2018   Procedure: RIGHT/LEFT HEART CATH AND CORONARY ANGIOGRAPHY;  Surgeon: Troy Sine, MD;  Location: [facility];  Service: Cardiovascular;  Laterality: N/A;  . SPINAL CORD STIMULATOR INSERTION N/A 09/09/2015   Procedure: LUMBAR SPINAL CORD STIMULATOR INSERTION;  Surgeon: Clydell Hakim, MD;  Location: [facility];  Service: Neurosurgery;  Laterality: N/A;  LUMBAR SPINAL CORD STIMULATOR INSERTION  . SPINE SURGERY  April 2013   Back X's 4  . TOTAL HIP ARTHROPLASTY     right  . WRIST FRACTURE SURGERY Bilateral     Allergies  Allergen Reactions  . Adrenalone   . Lactose Other (See Comments)    abd pain, lactose intolerant  . Morphine Other (See Comments)  . Sulfa Antibiotics Other (See Comments)    Headache, very sick  . Codeine Other (See Comments)    Reaction:  Headaches and nightmares   . Lactose Intolerance (Gi) Nausea And Vomiting  . Latex Rash  . Lyrica [Pregabalin] Swelling and Other (See Comments)    Reaction:  Leg swelling  . Other Other (See Comments)    Pt states that pain medications give her nightmares.    . Plaquenil [Hydroxychloroquine Sulfate] Other (See Comments)    Reaction:  GI upset   . Reglan [Metoclopramide] Other (See Comments)    Reaction:  GI upset   . Requip [Ropinirole Hcl] Other (See Comments)    Reaction:  GI upset   . Septra [Sulfamethoxazole-Trimethoprim] Nausea And Vomiting  . Shellfish Allergy Nausea And Vomiting    Allergies as of 04/29/2020      Reactions   Adrenalone    Lactose Other (See Comments)   abd pain, lactose intolerant   Morphine Other (See Comments)   Sulfa Antibiotics Other (See Comments)   Headache, very sick   Codeine Other (See Comments)   Reaction:   Headaches and nightmares    Lactose Intolerance (gi) Nausea And Vomiting   Latex Rash   Lyrica [pregabalin] Swelling, Other (See Comments)   Reaction:  Leg swelling   Other Other (See Comments)   Pt states that pain medications give her nightmares.     Plaquenil [hydroxychloroquine Sulfate] Other (See Comments)   Reaction:  GI upset    Reglan [metoclopramide] Other (See Comments)   Reaction:  GI upset    Requip [ropinirole Hcl] Other (See Comments)   Reaction:  GI upset    Septra [sulfamethoxazole-trimethoprim] Nausea And Vomiting   Shellfish Allergy Nausea And Vomiting      Medication List       Accurate as of Apr 29, 2020  3:48 PM. If you have any questions, ask your nurse or doctor.        acetaminophen 500 MG tablet  Commonly known as: TYLENOL Take 500 mg by mouth daily as needed for mild pain.   TYLENOL 500 MG tablet Generic drug: acetaminophen Take 500 mg by mouth 2 (two) times daily.   AeroChamber MV inhaler by Other route. Use as instructed as needed   albuterol 108 (90 Base) MCG/ACT inhaler Commonly known as: VENTOLIN HFA Inhale 2 puffs into the lungs every 6 (six) hours as needed for wheezing or shortness of breath.   aspirin EC 81 MG tablet Take 81 mg by mouth daily.   Calcium 600/Vitamin D3 600-800 MG-UNIT Tabs Generic drug: Calcium Carb-Cholecalciferol Take 1 tablet by mouth daily.   carbidopa-levodopa 25-100 MG tablet Commonly known as: SINEMET IR Take 2 tablets by mouth in the morning and at bedtime.   cephALEXin 500 MG capsule Commonly known as: KEFLEX Take 1 capsule (500 mg total) by mouth 4 (four) times daily for 8 days.   clotrimazole-betamethasone cream Commonly known as: Lotrisone Apply 1 application topically 2 (two) times daily.   docusate sodium 100 MG capsule Commonly known as: COLACE Take 100 mg by mouth at bedtime.   eucerin cream Apply 1 application topically daily.   ezetimibe 10 MG tablet Commonly known as: ZETIA TAKE 1  TABLET ONCE DAILY.   hydrocortisone 2.5 % cream Apply 1 application topically 2 (two) times daily.   hydroxychloroquine 200 MG tablet Commonly known as: PLAQUENIL Take 200 mg by mouth daily.   lansoprazole 30 MG disintegrating tablet Commonly known as: PREVACID SOLUTAB Take 30 mg by mouth daily at 12 noon.   levothyroxine 50 MCG tablet Commonly known as: SYNTHROID Take 50 mcg by mouth daily before breakfast.   lubiprostone 24 MCG capsule Commonly known as: AMITIZA Take 24 mcg by mouth 2 (two) times daily with a meal.   multivitamin with minerals Tabs tablet Take 1 tablet by mouth daily.   nystatin powder Commonly known as: MYCOSTATIN/NYSTOP Apply topically 2 (two) times daily as needed.   pramipexole 1 MG tablet Commonly known as: MIRAPEX Take 1 mg by mouth 2 (two) times daily.   predniSONE 1 MG tablet Commonly known as: DELTASONE Take 3 mg by mouth daily with breakfast.   PreserVision AREDS 2 Chew Chew 1 tablet by mouth daily.   QUEtiapine 25 MG tablet Commonly known as: SEROQUEL Take 12.5 mg by mouth every morning. 1/2 tablet to = 12.5 mg QAM   QUEtiapine 25 MG tablet Commonly known as: SEROQUEL Take 25 mg by mouth daily.   rosuvastatin 20 MG tablet Commonly known as: CRESTOR Take 1 tablet (20 mg total) by mouth daily.   saccharomyces boulardii 250 MG capsule Commonly known as: FLORASTOR Take 250 mg by mouth 2 (two) times daily.   senna-docusate 8.6-50 MG tablet Commonly known as: Senokot-S Take 1 tablet by mouth at bedtime.   sertraline 50 MG tablet Commonly known as: ZOLOFT Take 75 mg by mouth at bedtime. 1-1/2 tablets to = 75 mg   Slow Fe 142 (45 Fe) MG Tbcr Generic drug: Ferrous Sulfate Take 1 tablet by mouth. Daily   Systane 0.4-0.3 % Soln Generic drug: Polyethyl Glycol-Propyl Glycol Apply 1 drop to eye 2 (two) times daily as needed (dry eyes).   Vitamin D 50 MCG (2000 UT) Caps Take 2,000 Units by mouth daily.       Review of Systems   Constitutional: Positive for activity change and unexpected weight change.  HENT: Negative.   Respiratory: Positive for shortness of breath.   Cardiovascular: Positive for leg swelling.  Gastrointestinal:  Negative.   Genitourinary: Negative.   Musculoskeletal: Positive for gait problem.  Skin: Positive for color change.  Neurological: Positive for weakness.  Psychiatric/Behavioral: Positive for hallucinations. The patient is nervous/anxious.     Immunization History  Administered Date(s) Administered  . Influenza Split 09/09/2013  . Influenza, High Dose Seasonal PF 09/12/2018, 09/15/2019  . Influenza,inj,Quad PF,6+ Mos 09/11/2016  . Moderna SARS-COVID-2 Vaccination 12/12/2019, 01/09/2020  . Pneumococcal Conjugate-13 04/15/2014  . Pneumococcal Polysaccharide-23 09/28/2005  . Tdap 09/01/2015  . Zoster Recombinat (Shingrix) 06/11/2018, 09/10/2018   Pertinent  Health Maintenance Due  Topic Date Due  . INFLUENZA VACCINE  07/10/2020  . DEXA SCAN  Completed  . PNA vac Low Risk Adult  Completed   Fall Risk  11/16/2019 04/23/2019 04/09/2019 10/29/2018 10/27/2018  Falls in the past year? 0 0 0 0 1  Comment - - - fell June 2019 -  Number falls in past yr: - 0 0 - 0  Injury with Fall? - 0 0 - 1  Comment - - - - -  Risk for fall due to : - - - - -  Risk for fall due to: Comment - - - - -   Functional Status Survey:    Vitals:   04/29/20 1537  BP: 140/70  Pulse: 88  Resp: 20  Temp: 98.5 F (36.9 C)  SpO2: 95%  Weight: 151 lb 14.4 oz (68.9 kg)  Height: 5\' 2"  (1.575 m)   Body mass index is 27.78 kg/m. Physical Exam Vitals reviewed.  Constitutional:      Appearance: Normal appearance.  HENT:     Head: Normocephalic.     Nose: Nose normal.     Mouth/Throat:     Mouth: Mucous membranes are moist.     Pharynx: Oropharynx is clear.  Eyes:     Pupils: Pupils are equal, round, and reactive to light.  Cardiovascular:     Rate and Rhythm: Normal rate and regular rhythm.      Heart sounds: Murmur present.  Pulmonary:     Effort: Pulmonary effort is normal. No respiratory distress.     Breath sounds: Normal breath sounds. No wheezing or rales.  Abdominal:     General: Abdomen is flat. Bowel sounds are normal.     Palpations: Abdomen is soft.  Musculoskeletal:     Cervical back: Neck supple.     Comments: Moderate swelling bilateral  Skin:    Comments: Has a bruise in front and back of her left thigh  Also has mild redness around the nephrostomy tube  Neurological:     General: No focal deficit present.     Mental Status: She is alert and oriented to person, place, and time.  Psychiatric:        Mood and Affect: Mood normal.     Labs reviewed: Recent Labs    04/21/20 0325 04/22/20 0605 04/22/20 0903 04/23/20 0350 04/24/20 0423 04/25/20 0427  NA 143  --    < > 144 145 146*  K 3.4*  --    < > 3.1* 3.0* 3.8  CL 115*  --    < > 119* 115* 115*  CO2 20*  --    < > 21* 22 26  GLUCOSE 129*  --    < > 80 85 88  BUN 35*  --    < > 33* 26* 16  CREATININE 1.23*  --    < > 1.01* 0.78 0.81  CALCIUM 7.2*  --    < >  7.2* 7.0* 7.6*  MG 3.0* 2.6*  --  2.5*  --   --   PHOS 3.2 2.6  --  2.3*  --   --    < > = values in this interval not displayed.   Recent Labs    04/20/20 0455 04/21/20 0325 04/24/20 0423  AST 117* 50* 32  ALT 18 8 7   ALKPHOS 95 95 77  BILITOT 0.5 0.5 0.6  PROT 5.1* 5.4* 4.7*  ALBUMIN 2.3* 2.3* 2.3*   Recent Labs    02/12/20 0000 03/31/20 0000 04/19/20 0200 04/19/20 1442 04/23/20 0350 04/23/20 0350 04/24/20 0423 04/25/20 0427 04/26/20 0000  WBC 7.5   < > 1.0*   < > 12.5*   < > 14.5* 13.8* 10.7  NEUTROABS 4,845  --  0.7*  --   --   --   --   --  7,447  HGB 11.0*   < > 9.1*   < > 7.3*   < > 8.8* 9.4* 9.2*  HCT 33*   < > 29.5*   < > 23.8*   < > 27.1* 29.7* 28*  MCV  --   --  101.4*   < > 100.4*  --  97.5 96.7  --   PLT  --    < > 131*   < > 173   < > 194 226 245   < > = values in this interval not displayed.   Lab Results    Component Value Date   TSH 1.62 09/08/2019   No results found for: HGBA1C Lab Results  Component Value Date   CHOL 118 07/14/2019   HDL 50 07/14/2019   LDLCALC 32 07/14/2019   TRIG 182 (H) 07/14/2019   CHOLHDL 2.4 07/14/2019    Significant Diagnostic Results in last 30 days:  DG Chest 1 View  Result Date: 04/19/2020 CLINICAL DATA:  Fever, tachypnea EXAM: CHEST  1 VIEW COMPARISON:  04/19/2020 FINDINGS: Heart is normal size. Mediastinal contours within normal limits. Spinal stimulator wires remain in stable position. No confluent airspace opacities or edema. Possible trace bilateral effusions. No acute bony abnormality. IMPRESSION: Possible trace bilateral effusions. Electronically Signed   By: Rolm Baptise M.D.   On: 04/19/2020 17:42   DG Chest Port 1 View  Result Date: 04/19/2020 CLINICAL DATA:  Shortness of breath and fever EXAM: PORTABLE CHEST 1 VIEW COMPARISON:  Chest radiograph 01/15/2019 FINDINGS: The heart size and mediastinal contours are within normal limits. Both lungs are clear. The visualized skeletal structures are unremarkable. Spinal stimulator leads overlie the midthoracic spine. IMPRESSION: No active disease. Electronically Signed   By: Ulyses Jarred M.D.   On: 04/19/2020 02:35   ECHOCARDIOGRAM COMPLETE  Result Date: 04/20/2020    ECHOCARDIOGRAM REPORT   Patient Name:   [name] Date of Exam: 04/20/2020 Medical Rec #:  [id number]         Height:       62.0 in Accession #:    [id number]        Weight:       157.2 lb Date of Birth:  [birth year]        BSA:          1.726 m Patient Age:    51 years          BP:           99/58 mmHg Patient Gender: F  HR:           85 bpm. Exam Location:  Inpatient Procedure: 2D Echo, Color Doppler and Cardiac Doppler Indications:    Acute Respiratory Insufficiency R06.89  History:        Patient has prior history of Echocardiogram examinations, most                 recent 12/30/2019. CHF; Risk Factors:Hypertension and                  Dyslipidemia.  Sonographer:    Raquel Sarna Senior RDCS Referring Phys: [place]  1. Since the last study on 12/30/2019 aortic stenosis is now severe - peak/mean transaortic gradients 66/41 mmHg and dimensionless index 0.21. Referral for a TAVR procedure should be considered.  2. Left ventricular ejection fraction, by estimation, is 60 to 65%. The left ventricle has normal function. The left ventricle has no regional wall motion abnormalities. There is mild concentric left ventricular hypertrophy. Left ventricular diastolic parameters are consistent with Grade I diastolic dysfunction (impaired relaxation). Elevated left atrial pressure.  3. Right ventricular systolic function is normal. The right ventricular size is normal.  4. The mitral valve is normal in structure. Mild mitral valve regurgitation. No evidence of mitral stenosis.  5. The aortic valve is normal in structure. Aortic valve regurgitation is not visualized. Severe aortic valve stenosis. Aortic valve mean gradient measures 41.0 mmHg.  6. The inferior vena cava is normal in size with greater than 50% respiratory variability, suggesting right atrial pressure of 3 mmHg. FINDINGS  Left Ventricle: Left ventricular ejection fraction, by estimation, is 60 to 65%. The left ventricle has normal function. The left ventricle has no regional wall motion abnormalities. The left ventricular internal cavity size was normal in size. There is  mild concentric left ventricular hypertrophy. Left ventricular diastolic parameters are consistent with Grade I diastolic dysfunction (impaired relaxation). Elevated left atrial pressure. Right Ventricle: The right ventricular size is normal. No increase in right ventricular wall thickness. Right ventricular systolic function is normal. Left Atrium: Left atrial size was normal in size. Right Atrium: Right atrial size was normal in size. Pericardium: There is no evidence of pericardial effusion. Mitral  Valve: The mitral valve is normal in structure. There is mild thickening of the mitral valve leaflet(s). There is mild calcification of the mitral valve leaflet(s). Normal mobility of the mitral valve leaflets. Moderate mitral annular calcification. Mild mitral valve regurgitation. No evidence of mitral valve stenosis. Tricuspid Valve: The tricuspid valve is normal in structure. Tricuspid valve regurgitation is mild . No evidence of tricuspid stenosis. Aortic Valve: The aortic valve is normal in structure.. There is severe thickening and severe calcifcation of the aortic valve. Aortic valve regurgitation is not visualized. Severe aortic stenosis is present. There is severe thickening of the aortic valve. There is severe calcifcation of the aortic valve. Aortic valve mean gradient measures 41.0 mmHg. Aortic valve peak gradient measures 65.3 mmHg. Aortic valve area, by VTI measures 0.85 cm. Pulmonic Valve: The pulmonic valve was normal in structure. Pulmonic valve regurgitation is not visualized. No evidence of pulmonic stenosis. Aorta: The aortic root is normal in size and structure. Venous: The inferior vena cava is normal in size with greater than 50% respiratory variability, suggesting right atrial pressure of 3 mmHg. IAS/Shunts: No atrial level shunt detected by color flow Doppler.  LEFT VENTRICLE PLAX 2D LVIDd:         3.00 cm  Diastology LVIDs:  2.50 cm  LV e' lateral:   6.64 cm/s LV PW:         1.10 cm  LV E/e' lateral: 20.2 LV IVS:        1.10 cm  LV e' medial:    4.90 cm/s LVOT diam:     2.10 cm  LV E/e' medial:  27.3 LV SV:         88 LV SV Index:   51 LVOT Area:     3.46 cm  RIGHT VENTRICLE RV S prime:     6.96 cm/s TAPSE (M-mode): 2.2 cm LEFT ATRIUM             Index LA diam:        3.80 cm 2.20 cm/m LA Vol (A2C):   61.6 ml 35.70 ml/m LA Vol (A4C):   76.9 ml 44.56 ml/m LA Biplane Vol: 75.2 ml 43.58 ml/m  AORTIC VALVE AV Area (Vmax):    0.72 cm AV Area (Vmean):   0.73 cm AV Area (VTI):      0.85 cm AV Vmax:           404.00 cm/s AV Vmean:          299.000 cm/s AV VTI:            1.030 m AV Peak Grad:      65.3 mmHg AV Mean Grad:      41.0 mmHg LVOT Vmax:         84.20 cm/s LVOT Vmean:        63.400 cm/s LVOT VTI:          0.254 m LVOT/AV VTI ratio: 0.25  AORTA Ao Root diam: 3.40 cm Ao Asc diam:  3.10 cm MITRAL VALVE MV Area (PHT): 3.72 cm     SHUNTS MV Decel Time: 204 msec     Systemic VTI:  0.25 m MV E velocity: 134.00 cm/s  Systemic Diam: 2.10 cm MV A velocity: 131.00 cm/s MV E/A ratio:  1.02 Ena Dawley MD Electronically signed by Ena Dawley MD Signature Date/Time: 04/20/2020/11:59:59 AM    Final    CT RENAL STONE STUDY  Result Date: 04/19/2020 CLINICAL DATA:  Severe sepsis.  Rule out hydronephrosis EXAM: CT ABDOMEN AND PELVIS WITHOUT CONTRAST TECHNIQUE: Multidetector CT imaging of the abdomen and pelvis was performed following the standard protocol without IV contrast. COMPARISON:  01/02/2019 FINDINGS: Lower chest: Trace pleural effusions and lower lobe atelectasis. Extensive coronary atherosclerosis. Small hiatal hernia. Hepatobiliary: No focal liver abnormality.No evidence of biliary obstruction or stone. Pancreas: Mild generalized atrophy. Spleen: Unremarkable. Adrenals/Urinary Tract: Negative adrenals. Right hydroureteronephrosis due to a obstructing 11 x 7 mm stone at the pelvic inlet. Irregular 7 mm interpolar calculus in the left kidney. Two small lower pole calculi on the right. Unremarkable bladder. Stomach/Bowel:  No obstruction. Moderate rectal stool. Vascular/Lymphatic: Atherosclerosis no acute vascular abnormality. No mass or adenopathy. Reproductive:Hysterectomy Other: No ascites or pneumoperitoneum. Musculoskeletal: Lumbar fusion from L2-S1. Severe adjacent segment degeneration at T12-L1 and especially L1-2 where there is retrolisthesis and spinal/foraminal impingement. A spinal stimulator is present. IMPRESSION: 1. Obstructing 11 x 7 mm stone in the right ureter at the  level of the pelvic inlet. 2. Bilateral nephrolithiasis. 3. Incidental findings are noted above. Electronically Signed   By: Monte Fantasia M.D.   On: 04/19/2020 06:29   IR NEPHROSTOMY PLACEMENT RIGHT  Result Date: 04/19/2020 INDICATION: Urinary sepsis secondary to right-sided hydronephrosis. EXAM: 1. ULTRASOUND GUIDANCE FOR PUNCTURE OF THE RIGHT RENAL COLLECTING  SYSTEM 2. RIGHT PERCUTANEOUS NEPHROSTOMY TUBE PLACEMENT. COMPARISON:  CT abdomen pelvis-earlier same day; 01/02/2019 MEDICATIONS: Rocephin 1 gm IV; The antibiotic was administered in an appropriate time frame prior to skin puncture. ANESTHESIA/SEDATION: Moderate (conscious) sedation was employed during this procedure. A total of Versed 0.5 mg was administered intravenously. Moderate Sedation Time: 14 minutes. The patient's level of consciousness and vital signs were monitored continuously by radiology nursing throughout the procedure under my direct supervision. CONTRAST:  10 mL Isovue 300 - administered into the renal collecting system FLUOROSCOPY TIME:  1 minute, 36 seconds (79 mGy) COMPLICATIONS: None immediate. PROCEDURE: The procedure, risks, benefits, and alternatives were explained to the patient. Questions regarding the procedure were encouraged and answered. The patient understands and consents to the procedure. A timeout was performed prior to the initiation of the procedure. The right flank region was prepped and draped in the usual sterile fashion and a sterile drape was applied covering the operative field. A sterile gown and sterile gloves were used for the procedure. Local anesthesia was provided with 1% Lidocaine with epinephrine. Ultrasound was used to localize the right kidney. Under direct ultrasound guidance, a 20 gauge needle was advanced into the renal collecting system. An ultrasound image documentation was performed. Access within the collecting system was confirmed with the efflux of urine followed by limited contrast  injection. Over a Nitrex wire, the tract was dilated with an Accustick stent. Next, under intermittent fluoroscopic guidance and over a short Amplatz wire, the track was dilated ultimately allowing placement of a 10-French percutaneous nephrostomy catheter which was advanced to the level of the renal pelvis where the coil was formed and locked. Contrast was injected and several spot fluoroscopic images were obtained in various obliquities. The catheter was secured at the skin with a Prolene retention suture and stat lock device and connected to a gravity bag was placed. Dressings were applied. The patient tolerated procedure well without immediate postprocedural complication. FINDINGS: Ultrasound scanning demonstrates a moderate to severely dilated right collecting system. Under a combination of ultrasound and fluoroscopic guidance, a posterior inferior calix was targeted allowing placement of a 10-French percutaneous nephrostomy catheter with end coiled and locked within the renal pelvis. Contrast injection confirmed appropriate positioning. Limited contrast injection suggest questioned right proximal ureteral stone likely represents a calcified phlebolith as was suggested on more remote abdominal CT performed 01/02/2019. IMPRESSION: 1. Successful ultrasound and fluoroscopic guided placement of a right sided 10 [place] PCN. 2. Limited contrast injection confirms questioned proximal right ureteral/UPJ stone likely represents a calcified phleboliths as was suggested on more remote abdominal CT 01/02/2019. PLAN: As the etiology of the patient's right-sided urinary obstruction remains indeterminate, the patient may return for right-sided antegrade nephrostogram with potential right-sided ureteral stent placement in 2-3 weeks as indicated. Electronically Signed   By: Sandi Mariscal M.D.   On: 04/19/2020 10:47    Assessment/Plan Bilateral edema of lower extremity We will restart her back on her Lasix 20 with  potassium Repeat BMP  Nonrheumatic aortic valve stenosis Needs follow-up with cardiology   Urinary tract infection without hematuria, site unspecified Continue on Keflex Obstruction of right ureteropelvic junction due to stone S/P Nephrostomy tube Follow up with urology Chronic constipation Doing well on Amitiza Iron deficiency anemia due to chronic blood loss On Iron S/P Transfusion Repeat CBC Parkinson disease Doing well on Sinemet and Mirapex Has restarted working with therapy Visual hallucinations On Seroquel Stable Erosive osteoarthritis On Plaquenil Prednisone discontinued  Hypothyroidism, unspecified type TSH Normal  Depression,  unspecified depression type Stable on Zoloft Seen by Facility psychologist  Osteoporosis Up to date on [place] it out of facility Hyperlipidemia LDL 32 On Crestor Onychomycosis of Toes On Lotrisone per [employer] staff Communication:   Labs/tests ordered:

## 2020-05-02 ENCOUNTER — Other Ambulatory Visit: Payer: Self-pay | Admitting: Urology

## 2020-05-02 DIAGNOSIS — N201 Calculus of ureter: Secondary | ICD-10-CM | POA: Diagnosis not present

## 2020-05-03 ENCOUNTER — Encounter (HOSPITAL_COMMUNITY): Payer: Self-pay | Admitting: Urology

## 2020-05-04 NOTE — Progress Notes (Addendum)
Anesthesia Review:  PCP: [facility] LOV- 04/29/20  Cardiologist : DR Fransico Him- lov 02/12/20  Chest x-ray : 04/19/20 ( trace bil pleural effusions)  EKG :04/21/20  Echo : 04/20/20  Cardiac Cath : 01/2018  Sleep Study/ CPAP : Fasting Blood Sugar :      / Checks Blood Sugar -- times a day:   Blood Thinner/ Instructions /Last Dose: ASA / Instructions/ Last Dose :   Patient denies shortness of breath, chest pain, fever, and cough at this phone interview. 04/26/20- hgb- 9.2

## 2020-05-04 NOTE — Progress Notes (Addendum)
Preop instructions for:  Mahelet Blomgren                        Date of Birth: 01-Nov-1931                             Date of Procedure: 05/11/20        Doctor: DR Harrell Gave [name]  Time to arrive at [facility]: 1030 am  Report to: Admitting  Procedure:Cystoscopy , Stent    Do not eat  past midnight the night before your procedure.(To include any tube feedings-must be discontinued)  May have clear liquids from 84midnite utnil 1030am morning of procedure then nothing by mouth.      CLEAR LIQUID DIET   Foods Allowed                                                                     Foods Excluded  Coffee and tea, regular and decaf                             liquids that you cannot  Plain Jell-O any favor except red or purple                                           see through such as: Fruit ices (not with fruit pulp)                                     milk, soups, orange juice  Iced Popsicles                                    All solid food Carbonated beverages, regular and diet                                    Cranberry, grape and apple juices Sports drinks like Gatorade Lightly seasoned clear broth or consume(fat free) Sugar, honey syrup  Sample Menu Breakfast                                Lunch                                     Supper Cranberry juice                    Beef broth                            Chicken broth Jell-O  Grape juice                           Apple juice Coffee or tea                        Jell-O                                      Popsicle                                                Coffee or tea                        Coffee or tea  _____________________________________________________________________     Take these morning medications only with sips of water.(or give through gastrostomy or feeding tube). Albutertol Inhaler if needed and send with patient, Crestor,  Levothyroxine, Pramipexole, Seroquel, Sinemet ,Hydroxychloroquine     Facility contact: [place]                  Phone: [phone]  Contact: [name] [phone] EXT [place]:  Transportation contact phone#: [place]  Please send day of procedure:current med list and meds last taken that day, confirm nothing by mouth status from what time, Patient Demographic info( to include DNR status, problem list, allergies)   RN contact name/phone#:                             and Fax [employer] card and picture ID Leave all jewelry and other valuables at place where living( no metal or rings to be worn) No contact lens Women-no make-up, no lotions,perfumes,powders   Any questions day of procedure,call  SHORT STAY-[phone]   Sent from :[facility] Presurgical Testing                   [place]                   [phone]  Sent by : Gillian Shields RN

## 2020-05-04 NOTE — Progress Notes (Signed)
05/11/20 patient will be having surgery at [facility].  Please contact transportation at [facility] prior to discharge.  [phone] EXT [phone].  Either they will transport back to [facility] or ? [facility].

## 2020-05-06 ENCOUNTER — Telehealth: Payer: Self-pay | Admitting: Cardiology

## 2020-05-06 NOTE — Telephone Encounter (Signed)
   Primary Cardiologist: Fransico Him, MD  Chart reviewed as part of pre-operative protocol coverage. Spoke with patients husband, Saralyn Pilar who informs me that the patient resides at [facility]. Given the late hour today, we have decided that it would be best to coordinate a phone call on Tuesday 05/10/20. He asks that we call his cell phone [phone]) at [place] 05/10/20 and he will be with the patient so we can better assess her preoperative status. I will route this message to Almyra Deforest, PA-C who will be covering the preop pool that day.   Abigail Butts, PA-C 05/06/2020, 5:37 PM

## 2020-05-06 NOTE — Progress Notes (Addendum)
Anesthesia Chart Review   Case: [id number] Date/Time: 05/11/20 1330   Procedure: CYSTOSCOPY/RETROGRADE/URETEROSCOPY/HOLMIUM LASER/STENT PLACEMENT (Bilateral )   Anesthesia type: Choice   Pre-op diagnosis: RIGHT URETERAL STONE   Location: [facility] PROCEDURE [facility]OM / [facility]   Surgeons: Ceasar Mons, MD      DISCUSSION:86 y.o. never smoker with h/o hypothyroidism, GERD, Sjogren's, HTN, PVD, CHF, RBBB, severe AS, parkinson's disease, right ureteral stone scheduled for above procedure 05/11/2020 with Ellison Hughs, MD.   Pt last seen by cardiology 02/12/2020.  Per OV note appears euvolemic, HTN well controlled.  Echo 12/2019 with moderate AS.  S/p nephrostomy tube.    Recent admission 5/11-5/17/2021 due to septic shock due to UTI, obstructive right kidney stone.  Echo repeated during hospital admission with severe AS.  Per results, "Since the last study on 12/30/2019 aortic stenosis is now severe -  peak/mean transaortic gradients 66/41 mmHg and dimensionless index 0.21.  Referral for a TAVR procedure should be considered."  Cardiology follow up recommended.   Last seen by PCP 04/29/2020.  Pt experiencing bilateral lower extremity edema, Lasix restarted.  Per note cardiology follow up needed.    Discussed with Dr. Christella Hartigan.  In light of progression of AS since January cardiac input appreciated before proceeding.  Dr. Jackson Latino scheduler made aware. VM left 05/06/2020.   Addendum 05/10/2020:  Per cardiologist, Dr. Fransico Him, "She is at high risk from a cardiac standpoint given her severe AS.  If this is not an emergency or urgent condition, would prefer to wait until she has had a cardiac workup for AS but if urgent then would proceed know patient is high risk for cardiac complications in periop period."  Discussed with Dr. Kalman Shan, pt should have cardiac workup prior to proceeding as recommended by Dr. Radford Pax.  Pam with Dr. Jackson Latino office made aware.  VS: There were no vitals taken for this  visit.  PROVIDERS: [name], MD is PCP    LABS: labs dos (all labs ordered are listed, but only abnormal results are displayed)  Labs Reviewed - No data to display   IMAGES:   EKG: 04/21/2020 Rate 72 bpm Normal sinus rhythm  Right bundle branch block   CV: Echo 04/20/20 IMPRESSIONS   1. Since the last study on 12/30/2019 aortic stenosis is now severe -  peak/mean transaortic gradients 66/41 mmHg and dimensionless index 0.21.  Referral for a TAVR procedure should be considered.  2. Left ventricular ejection fraction, by estimation, is 60 to 65%. The  left ventricle has normal function. The left ventricle has no regional  wall motion abnormalities. There is mild concentric left ventricular  hypertrophy. Left ventricular diastolic  parameters are consistent with Grade I diastolic dysfunction (impaired  relaxation). Elevated left atrial pressure.  3. Right ventricular systolic function is normal. The right ventricular  size is normal.  4. The mitral valve is normal in structure. Mild mitral valve  regurgitation. No evidence of mitral stenosis.  5. The aortic valve is normal in structure. Aortic valve regurgitation is  not visualized. Severe aortic valve stenosis. Aortic valve mean gradient  measures 41.0 mmHg.  6. The inferior vena cava is normal in size with greater than 50%  respiratory variability, suggesting right atrial pressure of 3 mmHg.  Cardiac Cath 02/05/2018  Prox LAD lesion is 35% stenosed.   Mild nonobstructive CAD with 30-40% proximal LAD stenosis before the first diagonal vessel; normal left circumflex and normal RCA.  Upper normal right heart pressures.  Mild-to-moderate aortic stenosis  with a mean gradient of 14 mm and aortic valve area of 1.4 centimeters squared.  RECOMMENDATION:  Medical therapy.  The patient will follow-up with Dr. Golden Hurter.  Myocardial Perfusion 01/31/2018  Nuclear stress EF: 67%.  There was no ST segment  deviation noted during stress.  Defect 1: There is a large defect of severe severity present in the basal anteroseptal, mid anteroseptal and apex location.  Defect 2: There is a large defect of severe severity present in the basal inferolateral and mid inferolateral location.   Technically difficult study; abnormal stress nuclear study with large fixed defect in the septum and apex and large partially reversible lateral defect; EF 67 with normal wall motion; findings suggest prior infarct vs significant soft tissue attenuation; there is ischemia in the lateral wall.   Past Medical History:  Diagnosis Date  . Anemia    iron deficiency   . Anxiety   . Aortic stenosis    moderate by echo 2021   . Arthritis   . Broken arm    right   . Chronic diastolic CHF (congestive heart failure) ([place])   . Chronic pain   . Chronic pain   . Cognitive communication deficit   . Constipation   . Depression   . Family history of adverse reaction to anesthesia    pt. states sister vomits  . Fibromyalgia   . Fracture of thumb 09/01/2015   right  . Fracture, foot 09/01/2015   right  . GERD (gastroesophageal reflux disease)   . H/O hiatal hernia   . History of bronchitis   . History of kidney stones   . Hypercholesterolemia   . Hypertension    dr t turner  . Hypothyroidism   . Neuropathy   . Osteoporosis   . Other lack of coordination   . Parkinson disease ([place])   . PVD (peripheral vascular disease) ([facility])    99% stenosis of left anteiror tibial artery, mod stenosis of left distal SFA and popliteal artery followed by [name]  . RBBB    noted on EKG 2018  . Restless leg   . Restless leg syndrome   . Sepsis ([place])    hx of due to [facility]  . Shortness of breath    with exertion - chronic   . Sjogren's disease ([place])   . SOB (shortness of breath)    chronic due to diastolic dysfunction, deconditioning, obesity  . Spinal stenosis    lumbar region   . Tremor   . Unsteadiness on feet   .  Urinary tract infection   . Visual hallucinations     Past Surgical History:  Procedure Laterality Date  . ABDOMINAL HYSTERECTOMY    . BACK SURGERY     4 back surgeries,   lumbar fusion  . CARDIAC CATHETERIZATION  2006   normal  . CYSTOSCOPY/URETEROSCOPY/HOLMIUM LASER/STENT PLACEMENT Left 01/06/2019   Procedure: CYSTOSCOPY/RETROGRADE/URETEROSCOPY/HOLMIUM LASER/BASKET RETRIEVAL/STENT PLACEMENT;  Surgeon: Ceasar Mons, MD;  Location: [facility];  Service: Urology;  Laterality: Left;  . EYE SURGERY Bilateral    cateracts  . falls     variious fall, broken wrist,and toes  . FRACTURE SURGERY Right April 2016   Wrist, Pt. fell  . IR NEPHROSTOMY PLACEMENT RIGHT  04/19/2020  . kidney stone removal Left 01/20/2019  . RIGHT/LEFT HEART CATH AND CORONARY ANGIOGRAPHY N/A 02/05/2018   Procedure: RIGHT/LEFT HEART CATH AND CORONARY ANGIOGRAPHY;  Surgeon: Troy Sine, MD;  Location: [place] CV LAB;  Service: Cardiovascular;  Laterality: N/A;  . SPINAL CORD STIMULATOR INSERTION N/A 09/09/2015   Procedure: LUMBAR SPINAL CORD STIMULATOR INSERTION;  Surgeon: Clydell Hakim, MD;  Location: [place] [facility];  Service: Neurosurgery;  Laterality: N/A;  LUMBAR SPINAL CORD STIMULATOR INSERTION  . SPINE SURGERY  April 2013   Back X's 4  . TOTAL HIP ARTHROPLASTY     right  . WRIST FRACTURE SURGERY Bilateral     MEDICATIONS: No current facility-administered medications for this encounter.   Marland Kitchen acetaminophen (TYLENOL) 500 MG tablet  . acetaminophen (TYLENOL) 500 MG tablet  . albuterol (PROVENTIL HFA;VENTOLIN HFA) 108 (90 Base) MCG/ACT inhaler  . aspirin EC 81 MG tablet  . Calcium Carb-Cholecalciferol (CALCIUM 600/VITAMIN D3) 600-800 MG-UNIT TABS  . carbidopa-levodopa (SINEMET IR) 25-100 MG tablet  . Cholecalciferol (VITAMIN D) 2000 units CAPS  . clotrimazole-betamethasone (LOTRISONE) cream  . docusate sodium (COLACE) 100 MG capsule  . ezetimibe (ZETIA) 10 MG tablet  . Ferrous Sulfate (SLOW FE)  142 (45 Fe) MG TBCR  . hydrocortisone 2.5 % cream  . hydroxychloroquine (PLAQUENIL) 200 MG tablet  . lansoprazole (PREVACID SOLUTAB) 30 MG disintegrating tablet  . levothyroxine (SYNTHROID, LEVOTHROID) 50 MCG tablet  . lubiprostone (AMITIZA) 24 MCG capsule  . Multiple Vitamin (MULTIVITAMIN WITH MINERALS) TABS tablet  . Multiple Vitamins-Minerals (PRESERVISION AREDS 2) CHEW  . nystatin (MYCOSTATIN/NYSTOP) powder  . Polyethyl Glycol-Propyl Glycol (SYSTANE) 0.4-0.3 % SOLN  . pramipexole (MIRAPEX) 1 MG tablet  . QUEtiapine (SEROQUEL) 25 MG tablet  . QUEtiapine (SEROQUEL) 25 MG tablet  . rosuvastatin (CRESTOR) 20 MG tablet  . saccharomyces boulardii (FLORASTOR) 250 MG capsule  . senna-docusate (SENOKOT-S) 8.6-50 MG tablet  . sertraline (ZOLOFT) 50 MG tablet  . Skin Protectants, Misc. (EUCERIN) cream  . Spacer/Aero-Holding Josiah Lobo (AEROCHAMBER MV) inhaler    Maia Plan [facility] Pre-Surgical Testing [phone] 05/06/20  1:18 PM

## 2020-05-06 NOTE — Telephone Encounter (Signed)
New message       [facility] Pre-operative Risk Assessment    HEARTCARE STAFF: - Please ensure there is not already an duplicate clearance open for this procedure. - Under Visit Info/Reason for Call, type in Other and utilize the format Clearance MM/DD/YY or Clearance TBD. Do not use dashes or single digits. - If request is for dental extraction, please clarify the # of teeth to be extracted.  Request for surgical clearance:  1. What type of surgery is being performed? Ureteroscopy for Kidney Stone   2. When is this surgery scheduled? 05/11/20  3. What type of clearance is required (medical clearance vs. Pharmacy clearance to hold med vs. Both)?medical   4. Are there any medications that need to be held prior to surgery and how long?n/a  5. Practice name and name of physician performing surgery? [facility], Dr. Harrell Gave [name]   6. What is the office phone number? [place]   7.   What is the office fax number?[phone]  8.   Anesthesia type (None, local, MAC, general) ? General    [name] 05/06/2020, 4:31 PM  _________________________________________________________________   (provider comments below)

## 2020-05-10 DIAGNOSIS — R279 Unspecified lack of coordination: Secondary | ICD-10-CM | POA: Diagnosis not present

## 2020-05-10 DIAGNOSIS — R310 Gross hematuria: Secondary | ICD-10-CM | POA: Diagnosis not present

## 2020-05-10 DIAGNOSIS — Z888 Allergy status to other drugs, medicaments and biological substances status: Secondary | ICD-10-CM | POA: Diagnosis not present

## 2020-05-10 DIAGNOSIS — I739 Peripheral vascular disease, unspecified: Secondary | ICD-10-CM | POA: Diagnosis not present

## 2020-05-10 DIAGNOSIS — I110 Hypertensive heart disease with heart failure: Secondary | ICD-10-CM | POA: Diagnosis not present

## 2020-05-10 DIAGNOSIS — I350 Nonrheumatic aortic (valve) stenosis: Secondary | ICD-10-CM | POA: Diagnosis not present

## 2020-05-10 DIAGNOSIS — G20 Parkinson's disease: Secondary | ICD-10-CM | POA: Diagnosis not present

## 2020-05-10 DIAGNOSIS — M6281 Muscle weakness (generalized): Secondary | ICD-10-CM | POA: Diagnosis not present

## 2020-05-10 DIAGNOSIS — N201 Calculus of ureter: Secondary | ICD-10-CM | POA: Diagnosis not present

## 2020-05-10 DIAGNOSIS — N200 Calculus of kidney: Secondary | ICD-10-CM | POA: Diagnosis not present

## 2020-05-10 DIAGNOSIS — Z885 Allergy status to narcotic agent status: Secondary | ICD-10-CM | POA: Diagnosis not present

## 2020-05-10 DIAGNOSIS — Z6825 Body mass index (BMI) 25.0-25.9, adult: Secondary | ICD-10-CM | POA: Diagnosis not present

## 2020-05-10 DIAGNOSIS — A4151 Sepsis due to Escherichia coli [E. coli]: Secondary | ICD-10-CM | POA: Diagnosis not present

## 2020-05-10 DIAGNOSIS — K5909 Other constipation: Secondary | ICD-10-CM | POA: Diagnosis not present

## 2020-05-10 DIAGNOSIS — E039 Hypothyroidism, unspecified: Secondary | ICD-10-CM | POA: Diagnosis not present

## 2020-05-10 DIAGNOSIS — M3500 Sicca syndrome, unspecified: Secondary | ICD-10-CM | POA: Diagnosis not present

## 2020-05-10 DIAGNOSIS — E7800 Pure hypercholesterolemia, unspecified: Secondary | ICD-10-CM | POA: Diagnosis not present

## 2020-05-10 DIAGNOSIS — G8929 Other chronic pain: Secondary | ICD-10-CM | POA: Diagnosis not present

## 2020-05-10 DIAGNOSIS — R441 Visual hallucinations: Secondary | ICD-10-CM | POA: Diagnosis not present

## 2020-05-10 DIAGNOSIS — Z91040 Latex allergy status: Secondary | ICD-10-CM | POA: Diagnosis not present

## 2020-05-10 DIAGNOSIS — R278 Other lack of coordination: Secondary | ICD-10-CM | POA: Diagnosis not present

## 2020-05-10 DIAGNOSIS — D500 Iron deficiency anemia secondary to blood loss (chronic): Secondary | ICD-10-CM | POA: Diagnosis not present

## 2020-05-10 DIAGNOSIS — Z20822 Contact with and (suspected) exposure to covid-19: Secondary | ICD-10-CM | POA: Diagnosis not present

## 2020-05-10 DIAGNOSIS — F419 Anxiety disorder, unspecified: Secondary | ICD-10-CM | POA: Diagnosis not present

## 2020-05-10 DIAGNOSIS — K219 Gastro-esophageal reflux disease without esophagitis: Secondary | ICD-10-CM | POA: Diagnosis not present

## 2020-05-10 DIAGNOSIS — E669 Obesity, unspecified: Secondary | ICD-10-CM | POA: Diagnosis not present

## 2020-05-10 DIAGNOSIS — M797 Fibromyalgia: Secondary | ICD-10-CM | POA: Diagnosis not present

## 2020-05-10 DIAGNOSIS — Z882 Allergy status to sulfonamides status: Secondary | ICD-10-CM | POA: Diagnosis not present

## 2020-05-10 DIAGNOSIS — M25471 Effusion, right ankle: Secondary | ICD-10-CM | POA: Diagnosis not present

## 2020-05-10 DIAGNOSIS — E02 Subclinical iodine-deficiency hypothyroidism: Secondary | ICD-10-CM | POA: Diagnosis not present

## 2020-05-10 DIAGNOSIS — G2581 Restless legs syndrome: Secondary | ICD-10-CM | POA: Diagnosis not present

## 2020-05-10 DIAGNOSIS — Z8249 Family history of ischemic heart disease and other diseases of the circulatory system: Secondary | ICD-10-CM | POA: Diagnosis not present

## 2020-05-10 DIAGNOSIS — M154 Erosive (osteo)arthritis: Secondary | ICD-10-CM | POA: Diagnosis not present

## 2020-05-10 DIAGNOSIS — M25472 Effusion, left ankle: Secondary | ICD-10-CM | POA: Diagnosis not present

## 2020-05-10 DIAGNOSIS — D509 Iron deficiency anemia, unspecified: Secondary | ICD-10-CM | POA: Diagnosis not present

## 2020-05-10 DIAGNOSIS — N132 Hydronephrosis with renal and ureteral calculous obstruction: Secondary | ICD-10-CM | POA: Diagnosis not present

## 2020-05-10 DIAGNOSIS — Z87440 Personal history of urinary (tract) infections: Secondary | ICD-10-CM | POA: Diagnosis not present

## 2020-05-10 DIAGNOSIS — F323 Major depressive disorder, single episode, severe with psychotic features: Secondary | ICD-10-CM | POA: Diagnosis not present

## 2020-05-10 DIAGNOSIS — Z01818 Encounter for other preprocedural examination: Secondary | ICD-10-CM | POA: Diagnosis not present

## 2020-05-10 DIAGNOSIS — R2681 Unsteadiness on feet: Secondary | ICD-10-CM | POA: Diagnosis not present

## 2020-05-10 DIAGNOSIS — I10 Essential (primary) hypertension: Secondary | ICD-10-CM | POA: Diagnosis not present

## 2020-05-10 DIAGNOSIS — N390 Urinary tract infection, site not specified: Secondary | ICD-10-CM | POA: Diagnosis not present

## 2020-05-10 DIAGNOSIS — I5032 Chronic diastolic (congestive) heart failure: Secondary | ICD-10-CM | POA: Diagnosis not present

## 2020-05-10 DIAGNOSIS — Z881 Allergy status to other antibiotic agents status: Secondary | ICD-10-CM | POA: Diagnosis not present

## 2020-05-10 DIAGNOSIS — M1990 Unspecified osteoarthritis, unspecified site: Secondary | ICD-10-CM | POA: Diagnosis not present

## 2020-05-10 DIAGNOSIS — N202 Calculus of kidney with calculus of ureter: Secondary | ICD-10-CM | POA: Diagnosis present

## 2020-05-10 DIAGNOSIS — Z87442 Personal history of urinary calculi: Secondary | ICD-10-CM | POA: Diagnosis not present

## 2020-05-10 DIAGNOSIS — R0602 Shortness of breath: Secondary | ICD-10-CM | POA: Diagnosis not present

## 2020-05-10 DIAGNOSIS — R41841 Cognitive communication deficit: Secondary | ICD-10-CM | POA: Diagnosis not present

## 2020-05-10 NOTE — Telephone Encounter (Signed)
[name]  She needs to be set up for structural heart consult ASAp

## 2020-05-10 NOTE — Telephone Encounter (Signed)
preop note mentions the surgery will be done under general anesthesia. I have left message with Dr. Jackson Latino surgery scheduler to confirm again the patient will be done under general anesthesia. Waiting for callback from the surgery scheduler

## 2020-05-10 NOTE — Telephone Encounter (Signed)
Forwarded to [employer] who is working as preop on the surgery side. Awaiting to hear back if surgeon still want to proceed.

## 2020-05-10 NOTE — Telephone Encounter (Signed)
Spoke with Dr. Radford Pax and our structural heart clinic PA Angelena Form. From the TAVR perspective, given her recent sepsis, it would be prohibitive to proceed with TAVR procedure prior to her urology surgery. Dr. Radford Pax eventually cleared her to proceed with urology surgery knowning the higher risk nature. Then after her tube is out and kidney stone issue resolved, then we can consider valve clinic evaluation for TAVR. I spoke with Pam the urology surgery scheduler and Konrad Felix the anesthesiology PA to let them know as well.   I have also forwarded this to Gilberto Better who will see the patient this Thursday to consolidate the plan.

## 2020-05-10 NOTE — Telephone Encounter (Signed)
Patient's last cath was in 01/2018 which showed mild LAD disease, normal RCA and LCx. Previous echo in Jan 2021 showed moderate aortic stenosis. Spoke with the patient along with her husband, she denies any worsening dyspnea on exertion recently. She has no chest pain, dizziness or presyncope with walking. Unfortunately, mobility is largely restricted by the parkinson's disease, however she is doing physical therapy to strengthen herself. Overall, she is quite stable from cardiac perspective.   However, upon reviewing her record, it appears her aortic valve stenosis has progressed to severe on echocardiogram obtained during recent admission for UTI in the setting of kidney stone. Dr. Radford Pax, please review echo dating 04/20/2020 obtained during recent hospitalization and see if you would still recommend she proceed with Ureteroscopy for kidney stone tomorrow

## 2020-05-10 NOTE — Telephone Encounter (Signed)
Patient currently resides at [facility]. Anesthesia PA Konrad Felix has discussed the case with Dr. [name] who decided to postpone the procedure given its high risk nature. Callback pool to inform the patient and facility.   Also given worsening aortic stenosis on recent echo, patient will need early followup with Dr. Theodosia Blender to discuss additional therapy and workup. She is scheduled to see Vin on 6/3.

## 2020-05-10 NOTE — Telephone Encounter (Signed)
Is this to be done under general anesthesia

## 2020-05-10 NOTE — Telephone Encounter (Signed)
Received phone call from [facility] the surgery scheduler at Dr. Jackson Latino office, she has confirmed the surgery will be done under general anesthesia.

## 2020-05-10 NOTE — Telephone Encounter (Signed)
She is at high risk from a cardiac standpoint given her severe AS.  If this is not an emergency or urgent condition, would prefer to wait until she has had cardiac workup for AS but if urgent then would proceed know patient is high risk for cardiac complications in periop period

## 2020-05-10 NOTE — Telephone Encounter (Signed)
[place] and spoke with [name] who is the nurse on duty. I informed [name] about the postponement of Mrs. [name] procedure that was scheduled for tomorrow 05/11/20 with Dr. [name]. She stated that she was already informed and that the patient's husband is aware as well. She asked about our address for patient has an upcoming appointment on 05/12/20 with Vin, PA-C.

## 2020-05-12 ENCOUNTER — Encounter: Payer: Self-pay | Admitting: Physician Assistant

## 2020-05-12 ENCOUNTER — Other Ambulatory Visit: Payer: Self-pay

## 2020-05-12 ENCOUNTER — Ambulatory Visit (INDEPENDENT_AMBULATORY_CARE_PROVIDER_SITE_OTHER): Payer: Medicare Other | Admitting: Physician Assistant

## 2020-05-12 VITALS — BP 118/66 | HR 94 | Ht <= 58 in | Wt 148.0 lb

## 2020-05-12 DIAGNOSIS — Z01818 Encounter for other preprocedural examination: Secondary | ICD-10-CM | POA: Diagnosis not present

## 2020-05-12 DIAGNOSIS — I5032 Chronic diastolic (congestive) heart failure: Secondary | ICD-10-CM

## 2020-05-12 DIAGNOSIS — I350 Nonrheumatic aortic (valve) stenosis: Secondary | ICD-10-CM | POA: Diagnosis not present

## 2020-05-12 DIAGNOSIS — I10 Essential (primary) hypertension: Secondary | ICD-10-CM

## 2020-05-12 NOTE — Progress Notes (Signed)
Preop instructions for:  [name]                        Date of Birth: 09/04/1931                             Date of Procedure: 05/24/20        Doctor: DR Harrell Gave [name]  Time to arrive at [place]  Report to: Admitting  Procedure:Cystoscopy    Do not eat  past midnight the night before your procedure.(To include any tube feedings-must be discontinued) May have clear liquids from 7midnite until 0945am of surgery then nothing by mouth.     CLEAR LIQUID DIET   Foods Allowed                                                                     Foods Excluded  Coffee and tea, regular and decaf                             liquids that you cannot  Plain Jell-O any favor except red or purple                                           see through such as: Fruit ices (not with fruit pulp)                                     milk, soups, orange juice  Iced Popsicles                                    All solid food Carbonated beverages, regular and diet                                    Cranberry, grape and apple juices Sports drinks like Gatorade Lightly seasoned clear broth or consume(fat free) Sugar, honey syrup  Sample Menu Breakfast                                Lunch                                     Supper Cranberry juice                    Beef broth                            Chicken broth Jell-O  Grape juice                           Apple juice Coffee or tea                        Jell-O                                      Popsicle                                                Coffee or tea                        Coffee or tea  _____________________________________________________________________     Take these morning medications only with sips of water.(or give through gastrostomy or feeding tube). Albuterol Inhaler if needed and send with patient, Crestor, levothyroxine, Pramipexole, Seroquel,  Sinemet, Hydroxychloroquine     Facility contact:  [place]                 Phone:  [phone] Contact: [name] [phone] EXT [place]:  Transportation contact phone#: [place]  Please send day of procedure:current med list and meds last taken that day, confirm nothing by mouth status from what time, Patient Demographic info( to include DNR status, problem list, allergies)   RN contact name/phone#:                             and Fax #: [phone]  Bring Insurance card and picture ID Leave all jewelry and other valuables at place where living( no metal or rings to be worn) No contact lens Women-no make-up, no lotions,perfumes,powders   Any questions day of procedure,call  SHORT STAY-[phone]   Sent from :[facility] Presurgical Testing                   [place]                   Fax:[phone]  Sent by : Gillian Shields RN

## 2020-05-12 NOTE — Progress Notes (Addendum)
Cardiology Office Note    Date:  05/12/2020   ID:  [name], DOB Apr 22, 1931, MRN [id number]  PCP:  [name], MD  Cardiologist:  Dr. Radford Pax  Chief Complaint: Surgical clearance   History of Present Illness:   [name] is a 86 y.o. female with hx of chronic diastolic heart failure, severe aortic stenosis, hypertension, deconditioning, morbid obesity and chronic shortness of breath secondary to diastolic dysfunction presented for surgical clearance.  Cardiac cath in 2006 showed normal coronaries.  Patient has chronic dyspnea on exertion which felt multifactorial secondary to CHF, sedentary lifestyle with deconditioning and obesity.  Cardiac catheterization February 2019 showed mild nonobstructive coronary artery disease with 30 to 40% proximal LAD stenosis.  Mild to moderate aortic stenosis with mean gradient of 14 mmHg.  Most recently patient admitted in May 2021 with septic shock secondary to E. coli UTI and obstructive right kidney stone.  Now patient requiring ureteroscopy for kidney stone and surgical clearance requested.  Last echocardiogram Apr 20, 2020 now showed severe aortic stenosis with peak/mean transaortic gradients 66/41 mmHg and dimensionless index 0.21. LVEF is 60-65%.   Per Pre-op provider Almyra Deforest, [place] 05/10/20 "Spoke with Dr. Radford Pax and our structural heart clinic PA Angelena Form. From the TAVR perspective, given her recent sepsis, it would be prohibitive to proceed with TAVR procedure prior to her urology surgery. Dr. Radford Pax eventually cleared her to proceed with urology surgery knowning the higher risk nature. Then after her tube is out and kidney stone issue resolved, then we can consider valve clinic evaluation for TAVR. I spoke with [name] the urology surgery scheduler and Konrad Felix the anesthesiology PA to let them know as well.".   Here for follow up.  The patient was seen by Dr. Lyndel Safe 04/29/2020 and started on Lasix 20 mg and daily with  potassium supplement 10 mEq daily.  She did physical therapy for approximately 45 minutes this morning with some weight and resistance training without any problem.  History provided by husband.  She is in SNF and husband in assisted living facility at [facility].  Patient denies orthopnea, chest pain, palpitation, dizziness.  She has lower extremity edema.    Past Medical History:  Diagnosis Date  . Anemia    iron deficiency   . Anxiety   . Aortic stenosis    moderate by echo 2021   . Arthritis   . Broken arm    right   . Chronic diastolic CHF (congestive heart failure) ([place])   . Chronic pain   . Chronic pain   . Cognitive communication deficit   . Constipation   . Depression   . Family history of adverse reaction to anesthesia    pt. states sister vomits  . Fibromyalgia   . Fracture of thumb 09/01/2015   right  . Fracture, foot 09/01/2015   right  . GERD (gastroesophageal reflux disease)   . H/O hiatal hernia   . History of bronchitis   . History of kidney stones   . Hypercholesterolemia   . Hypertension    dr t turner  . Hypothyroidism   . Neuropathy   . Osteoporosis   . Other lack of coordination   . Parkinson disease ([place])   . PVD (peripheral vascular disease) ([facility])    99% stenosis of left anteiror tibial artery, mod stenosis of left distal SFA and popliteal artery followed by [name]  . RBBB    noted on EKG 2018  .  Restless leg   . Restless leg syndrome   . Sepsis ([place])    hx of due to [facility]  . Shortness of breath    with exertion - chronic   . Sjogren's disease ([place])   . SOB (shortness of breath)    chronic due to diastolic dysfunction, deconditioning, obesity  . Spinal stenosis    lumbar region   . Tremor   . Unsteadiness on feet   . Urinary tract infection   . Visual hallucinations     Past Surgical History:  Procedure Laterality Date  . ABDOMINAL HYSTERECTOMY    . BACK SURGERY     4 back surgeries,   lumbar fusion  . CARDIAC  CATHETERIZATION  2006   normal  . CYSTOSCOPY/URETEROSCOPY/HOLMIUM LASER/STENT PLACEMENT Left 01/06/2019   Procedure: CYSTOSCOPY/RETROGRADE/URETEROSCOPY/HOLMIUM LASER/BASKET RETRIEVAL/STENT PLACEMENT;  Surgeon: Ceasar Mons, MD;  Location: [facility];  Service: Urology;  Laterality: Left;  . EYE SURGERY Bilateral    cateracts  . falls     variious fall, broken wrist,and toes  . FRACTURE SURGERY Right April 2016   Wrist, Pt. fell  . IR NEPHROSTOMY PLACEMENT RIGHT  04/19/2020  . kidney stone removal Left 01/20/2019  . RIGHT/LEFT HEART CATH AND CORONARY ANGIOGRAPHY N/A 02/05/2018   Procedure: RIGHT/LEFT HEART CATH AND CORONARY ANGIOGRAPHY;  Surgeon: Troy Sine, MD;  Location: [facility];  Service: Cardiovascular;  Laterality: N/A;  . SPINAL CORD STIMULATOR INSERTION N/A 09/09/2015   Procedure: LUMBAR SPINAL CORD STIMULATOR INSERTION;  Surgeon: Clydell Hakim, MD;  Location: [facility];  Service: Neurosurgery;  Laterality: N/A;  LUMBAR SPINAL CORD STIMULATOR INSERTION  . SPINE SURGERY  April 2013   Back X's 4  . TOTAL HIP ARTHROPLASTY     right  . WRIST FRACTURE SURGERY Bilateral     Current Medications: Prior to Admission medications   Medication Sig Start Date End Date Taking? Authorizing Provider  acetaminophen (TYLENOL) 500 MG tablet Take 500 mg by mouth daily as needed for mild pain.     [provider]  acetaminophen (TYLENOL) 500 MG tablet Take 500 mg by mouth 2 (two) times daily.    [provider]  albuterol (PROVENTIL HFA;VENTOLIN HFA) 108 (90 Base) MCG/ACT inhaler Inhale 2 puffs into the lungs every 6 (six) hours as needed for wheezing or shortness of breath. 01/15/19   Collene Gobble, MD  aspirin EC 81 MG tablet Take 81 mg by mouth daily.     [provider]  Calcium Carb-Cholecalciferol (CALCIUM 600/VITAMIN D3) 600-800 MG-UNIT TABS Take 1 tablet by mouth daily.    [provider]  carbidopa-levodopa (SINEMET IR) 25-100 MG  tablet Take 2 tablets by mouth in the morning and at bedtime.     [provider]  Cholecalciferol (VITAMIN D) 2000 units CAPS Take 2,000 Units by mouth daily.    [provider]  clotrimazole-betamethasone (LOTRISONE) cream Apply 1 application topically 2 (two) times daily. 04/11/20   Trula Slade, DPM  docusate sodium (COLACE) 100 MG capsule Take 100 mg by mouth at bedtime.    [provider]  ezetimibe (ZETIA) 10 MG tablet TAKE 1 TABLET ONCE DAILY. 06/01/19   [name], MD  Ferrous Sulfate (SLOW FE) 142 (45 Fe) MG TBCR Take 1 tablet by mouth. Daily    [provider]  hydrocortisone 2.5 % cream Apply 1 application topically 2 (two) times daily.    [provider]  hydroxychloroquine (PLAQUENIL) 200 MG tablet Take  200 mg by mouth daily.    [provider]  lansoprazole (PREVACID SOLUTAB) 30 MG disintegrating tablet Take 30 mg by mouth daily at 12 noon.    [provider]  levothyroxine (SYNTHROID, LEVOTHROID) 50 MCG tablet Take 50 mcg by mouth daily before breakfast.     [provider]  lubiprostone (AMITIZA) 24 MCG capsule Take 24 mcg by mouth 2 (two) times daily with a meal.     [provider]  Multiple Vitamin (MULTIVITAMIN WITH MINERALS) TABS tablet Take 1 tablet by mouth daily.    [provider]  Multiple Vitamins-Minerals (PRESERVISION AREDS 2) CHEW Chew 1 tablet by mouth daily.     [provider]  nystatin (MYCOSTATIN/NYSTOP) powder Apply topically 2 (two) times daily as needed. 12/18/18   [name], Man X, NP  Polyethyl Glycol-Propyl Glycol (SYSTANE) 0.4-0.3 % SOLN Apply 1 drop to eye 2 (two) times daily as needed (dry eyes).     [provider]  pramipexole (MIRAPEX) 1 MG tablet Take 1 mg by mouth 2 (two) times daily.    [provider]  QUEtiapine (SEROQUEL) 25 MG tablet Take 12.5 mg by mouth every morning. 1/2 tablet to = 12.5 mg QAM    [provider]    QUEtiapine (SEROQUEL) 25 MG tablet Take 25 mg by mouth daily.    [provider]  rosuvastatin (CRESTOR) 20 MG tablet Take 1 tablet (20 mg total) by mouth daily. 05/01/19 04/30/20  [name], MD  saccharomyces boulardii (FLORASTOR) 250 MG capsule Take 250 mg by mouth 2 (two) times daily.    [provider]  senna-docusate (SENOKOT-S) 8.6-50 MG tablet Take 1 tablet by mouth at bedtime.     [provider]  sertraline (ZOLOFT) 50 MG tablet Take 75 mg by mouth at bedtime. 1-1/2 tablets to = 75 mg    [provider]  Skin Protectants, Misc. (EUCERIN) cream Apply 1 application topically daily.     [provider]  Spacer/Aero-Holding Chambers (AEROCHAMBER MV) inhaler by Other route. Use as instructed as needed    [provider]    Allergies:   Adrenalone, Lactose, Morphine, Sulfa antibiotics, Codeine, Lactose intolerance (gi), Latex, Lyrica [pregabalin], Other, Plaquenil [hydroxychloroquine sulfate], Reglan [metoclopramide], Requip [ropinirole hcl], Septra [sulfamethoxazole-trimethoprim], and Shellfish allergy   Social History   Socioeconomic History  . Marital status: Married    Spouse name: [name]  . Number of children: 2  . Years of education: HS  . Highest education level: Not on file  Occupational History    Comment: Homemaker  Tobacco Use  . Smoking status: Never Smoker  . Smokeless tobacco: Never Used  Substance and Sexual Activity  . Alcohol use: No    Alcohol/week: 0.0 standard drinks  . Drug use: No  . Sexual activity: Never  Other Topics Concern  . Not on file  Social History Narrative   Patient lives at home with her spouse. Married in 1957. Two people lives in the home, no pets.    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    Caffeine Use: tea   Social Determinants of Health   Financial Resource Strain:   . Difficulty of Paying Living Expenses:   Food  Insecurity:   . Worried About Charity fundraiser in the Last Year:   . Arboriculturist in the Last Year:   Transportation Needs:   . Lack of Transportation (  Medical):   Marland Kitchen Lack of Transportation (Non-Medical):   Physical Activity:   . Days of Exercise per Week:   . Minutes of Exercise per Session:   Stress:   . Feeling of Stress :   Social Connections:   . Frequency of Communication with Friends and Family:   . Frequency of Social Gatherings with Friends and Family:   . Attends Religious Services:   . Active Member of Clubs or Organizations:   . Attends Archivist Meetings:   Marland Kitchen Marital Status:      Family History:  The patient's family history includes COPD in her father; Heart attack in her mother; Heart disease in her mother; Heart failure in her mother; Hypertension in her mother; Restless legs syndrome in her mother.   ROS:   Please see the history of present illness.    ROS All other systems reviewed and are negative.   PHYSICAL EXAM:   VS:  BP 118/66   Pulse 94   Ht 4\' 10"  (1.473 m)   Wt 148 lb (67.1 kg)   SpO2 96%   BMI 30.93 kg/m    GEN: Elderly female in no acute distress  HEENT: normal  Neck: no JVD, carotid bruits, or masses Cardiac: RRR; 3/6 systolic murmurs, rubs, or gallops, 1-2+ edema  Respiratory:  clear to auscultation bilaterally, normal work of breathing GI: soft, nontender, nondistended, + BS MS: no deformity or atrophy  Skin: warm and dry, no rash Neuro:  Alert and Oriented x 3, Strength and sensation are intact Psych: euthymic mood, full affect  Wt Readings from Last 3 Encounters:  05/12/20 148 lb (67.1 kg)  04/29/20 151 lb 14.4 oz (68.9 kg)  04/26/20 144 lb 6.4 oz (65.5 kg)      Studies/Labs Reviewed:   EKG:  EKG is ordered today and demonstrates SR at rate of 96 bpm and RBBB  Recent Labs: 09/08/2019: TSH 1.62 04/23/2020: Magnesium 2.5 04/24/2020: ALT 7 04/25/2020: BUN 16; Creatinine, Ser 0.81; Potassium 3.8; Sodium  146 04/26/2020: Hemoglobin 9.2; Platelets 245   Lipid Panel    Component Value Date/Time   CHOL 118 07/14/2019 0811   TRIG 182 (H) 07/14/2019 0811   HDL 50 07/14/2019 0811   CHOLHDL 2.4 07/14/2019 0811   CHOLHDL 2.7 07/01/2018 0715   LDLCALC 32 07/14/2019 0811   LDLCALC 77 07/01/2018 0715    Additional studies/ records that were reviewed today include:   Echo 04/20/20 1. Since the last study on 12/30/2019 aortic stenosis is now severe -  peak/mean transaortic gradients 66/41 mmHg and dimensionless index 0.21.  Referral for a TAVR procedure should be considered.  2. Left ventricular ejection fraction, by estimation, is 60 to 65%. The  left ventricle has normal function. The left ventricle has no regional  wall motion abnormalities. There is mild concentric left ventricular  hypertrophy. Left ventricular diastolic  parameters are consistent with Grade I diastolic dysfunction (impaired  relaxation). Elevated left atrial pressure.  3. Right ventricular systolic function is normal. The right ventricular  size is normal.  4. The mitral valve is normal in structure. Mild mitral valve  regurgitation. No evidence of mitral stenosis.  5. The aortic valve is normal in structure. Aortic valve regurgitation is  not visualized. Severe aortic valve stenosis. Aortic valve mean gradient  measures 41.0 mmHg.  6. The inferior vena cava is normal in size with greater than 50%  respiratory variability, suggesting right atrial pressure of 3 mmHg.    RIGHT/LEFT HEART CATH  AND CORONARY ANGIOGRAPHY 01/2018  Conclusion    Prox LAD lesion is 35% stenosed.   Mild nonobstructive CAD with 30-40% proximal LAD stenosis before the first diagonal vessel; normal left circumflex and normal RCA.  Upper normal right heart pressures.  Mild-to-moderate aortic stenosis with a mean gradient of 14 mm and aortic valve area of 1.4 centimeters squared.  RECOMMENDATION:  Medical therapy.  The patient will  follow-up with Dr. Golden Hurter.       ASSESSMENT & PLAN:    1. Server aortic stenosis - Most recent echo 04/20/20 showed peak/mean transaortic gradients 66/41 mmHg and dimensionless index 0.21.  - Plan for TVAR work up after recover from urological procedure.  2. Surgical clearance -Patient needs TAVR work-up as summarized above however per prior conversation she needs to undergone her kidney stone removal first in setting of recent sepsis.  Patient is already cleared by Dr. Radford Pax as high risk.  I will fax this recommendations to requesting provider.  3.  Chronic diastolic heart failure -Patient has 2+ bilateral lower extremity edema.  Patient was started on low-dose Lasix by Dr. Lyndel Safe 04/29/2020.  I will continue current dose of Lasix.  Avoid aggressive diuresis in setting of severe aortic stenosis and urosepsis.  4.  Hyperlipidemia -Continue statin.    Medication Adjustments/Labs and Tests Ordered: Current medicines are reviewed at length with the patient today.  Concerns regarding medicines are outlined above.  Medication changes, Labs and Tests ordered today are listed in the Patient Instructions below. Patient Instructions  Medication Instructions:  Your physician recommends that you continue on your current medications as directed. Please refer to the Current Medication list given to you today.  *If you need a refill on your cardiac medications before your next appointment, please call your pharmacy*   Lab Work: None ordered  If you have labs (blood work) drawn today and your tests are completely normal, you will receive your results only by: Marland Kitchen MyChart Message (if you have MyChart) OR . A paper copy in the mail If you have any lab test that is abnormal or we need to change your treatment, we will call you to review the results.   Testing/Procedures: None ordered   Follow-Up: At [facility], you and your health needs are our priority.  As part of our continuing  mission to provide you with exceptional heart care, we have created designated Provider Care Teams.  These Care Teams include your primary Cardiologist (physician) and Advanced Practice Providers (APPs -  Physician Assistants and Nurse Practitioners) who all work together to provide you with the care you need, when you need it.  We recommend signing up for the patient portal called "MyChart".  Sign up information is provided on this After Visit Summary.  MyChart is used to connect with patients for Virtual Visits (Telemedicine).  Patients are able to view lab/test results, encounter notes, upcoming appointments, etc.  Non-urgent messages can be sent to your provider as well.   To learn more about what you can do with MyChart, go to [email or web address].    Your next appointment:   1 month(s)   06/08/2020  Arrive at 11:30   The format for your next appointment:   In Person  Provider:   You may see Fransico Him, MD or one of the following Advanced Practice Providers on your designated Care Team:    Melina Copa, PA-C  Ermalinda Barrios, PA-C    Other Instructions      Signed, Leanor Kail, [place]  05/12/2020 3:47 PM    [facility] [address], [place],   [id number] Phone: [phone]; Fax: [phone]

## 2020-05-12 NOTE — Patient Instructions (Addendum)
Medication Instructions:  Your physician recommends that you continue on your current medications as directed. Please refer to the Current Medication list given to you today.  *If you need a refill on your cardiac medications before your next appointment, please call your pharmacy*   Lab Work: None ordered  If you have labs (blood work) drawn today and your tests are completely normal, you will receive your results only by: Marland Kitchen MyChart Message (if you have MyChart) OR . A paper copy in the mail If you have any lab test that is abnormal or we need to change your treatment, we will call you to review the results.   Testing/Procedures: None ordered   Follow-Up: At [facility], you and your health needs are our priority.  As part of our continuing mission to provide you with exceptional heart care, we have created designated Provider Care Teams.  These Care Teams include your primary Cardiologist (physician) and Advanced Practice Providers (APPs -  Physician Assistants and Nurse Practitioners) who all work together to provide you with the care you need, when you need it.  We recommend signing up for the patient portal called "MyChart".  Sign up information is provided on this After Visit Summary.  MyChart is used to connect with patients for Virtual Visits (Telemedicine).  Patients are able to view lab/test results, encounter notes, upcoming appointments, etc.  Non-urgent messages can be sent to your provider as well.   To learn more about what you can do with MyChart, go to [email or web address].    Your next appointment:   1 month(s)   06/08/2020  Arrive at 11:30   The format for your next appointment:   In Person  Provider:   You may see Fransico Him, MD or one of the following Advanced Practice Providers on your designated Care Team:    Melina Copa, PA-C  Ermalinda Barrios, PA-C    Other Instructions

## 2020-05-13 NOTE — Progress Notes (Signed)
Requested current MAR from [facility].  LV[facility]

## 2020-05-13 NOTE — Progress Notes (Signed)
Patient seen by cardiology on 05/12/20.  Clearance in note.

## 2020-05-13 NOTE — Progress Notes (Deleted)
Preop instructions for: [name]                        Date of Birth; Jul 22, 1931                             Date of Procedure: 05/24/20        Doctor:Dr Harrell Gave [name]  Time to arrive at [facility]  Report to: Admitting  Procedure: Cystoscopy    Do not eat or drink past midnight the night before your procedure.(To include any tube feedings-must be discontinued)    Take these morning medications only with sips of water.(or give through gastrostomy or feeding  tube).                                                                                                                                                                                                                                                                                                                                                                                                                                                                                                                                                                                                                                                                                                                                                                                                                                                                                                                                                                                                                                                                                                                                                                                                                                                                                                                                                                                                                                                                                                                                                                                                                                                                                                                                                                                                                                                                                                                                                                                                                                                                                                                                                                                                                                                                                                                                                                                                                                                                                                                                                                                                                                                                                                                                                                                                                                                                                                                                                                                                                                                                                                                                                                                                                                                                                                                                                                                                                                                        -------------------------------------------------------------------------------------------------------------------------------------------------------------------------------------------------------------------------------------------------------------------------------------------------------------------------------------------------------------------------------------------------------------------------------------------------------------------------------------------------------------------------------------------------------------------------------------------------------------------------------------------------------------------------------------------------------------------------------------------------           -  Note: No Insulin or Diabetic meds should be given or taken the morning of the procedure!   Facility contact:                  Phone:                  Health Care POA:  Transportation contact phone#:  Please send day of procedure:current med list and meds last taken that day, confirm nothing by mouth status from what time, Patient Demographic info( to include DNR status, problem list, allergies)   RN  contact name/phone#:                             and Fax #:  Herbalist card and picture ID Leave all jewelry and other valuables at place where living( no metal or rings to be worn) No contact lens Women-no make-up, no lotions,perfumes,powders Men-no colognes,lotions  Any questions day of procedure,call  SHORT STAY-[phone]   Sent from :[facility]                   [place]                   Fax:[phone]  Sent by :               RN

## 2020-05-13 NOTE — Progress Notes (Signed)
Preop instructions for: [name]                         Date of Birth: [birth year]                             Date of Procedure: 05/24/2020       Doctor: DR Harrell Gave [name]  Time to arrive at [place]  Report to: Admitting  Procedure: Cystoscopy    Do not eat  past midnight the night before your procedure.(To include any tube feedings-must be discontinued) May have clear liquids from 75midnite until 0945am morning of surgery then nothing by mouth.      CLEAR LIQUID DIET   Foods Allowed                                                                     Foods Excluded  Coffee and tea, regular and decaf                             liquids that you cannot  Plain Jell-O any favor except red or purple                                           see through such as: Fruit ices (not with fruit pulp)                                     milk, soups, orange juice  Iced Popsicles                                    All solid food Carbonated beverages, regular and diet                                    Cranberry, grape and apple juices Sports drinks like Gatorade Lightly seasoned clear broth or consume(fat free) Sugar, honey syrup  Sample Menu Breakfast                                Lunch                                     Supper Cranberry juice                    Beef broth                            Chicken broth Jell-O  Grape juice                           Apple juice Coffee or tea                        Jell-O                                      Popsicle                                                Coffee or tea                        Coffee or tea  _____________________________________________________________________     Take these morning medications only with sips of water.(or give through gastrostomy or feeding tube). Albuterol Inhaler if needed and send with patient, Crestor, Levothyroxine, Pramipexole,  Seroquel. Sinemet, Hydroxychloroquine , Prevacid     Facility contact: [place]                  Phone: [phone] EXT [place]:  Transportation contact phone#: [place] [phone] Ext [phone]  Please send day of procedure:current med list and meds last taken that day, confirm nothing by mouth status from what time, Patient Demographic info( to include DNR status, problem list, allergies)   RN contact name/phone#: [name]          [phone] EXt [phone] or [phone]                   and Fax [employer] card and picture ID Leave all jewelry and other valuables at place where living( no metal or rings to be worn) No contact lens Women-no make-up, no lotions,perfumes,powders   Any questions day of procedure,call  SHORT STAY-[phone]   Sent from :[facility]                   [place]                   [phone]  Sent by :  Gillian Shields RN

## 2020-05-16 ENCOUNTER — Non-Acute Institutional Stay (SKILLED_NURSING_FACILITY): Payer: Medicare Other | Admitting: Nurse Practitioner

## 2020-05-16 ENCOUNTER — Encounter: Payer: Self-pay | Admitting: Nurse Practitioner

## 2020-05-16 DIAGNOSIS — K219 Gastro-esophageal reflux disease without esophagitis: Secondary | ICD-10-CM | POA: Diagnosis not present

## 2020-05-16 DIAGNOSIS — M154 Erosive (osteo)arthritis: Secondary | ICD-10-CM | POA: Diagnosis not present

## 2020-05-16 DIAGNOSIS — M25471 Effusion, right ankle: Secondary | ICD-10-CM

## 2020-05-16 DIAGNOSIS — R310 Gross hematuria: Secondary | ICD-10-CM

## 2020-05-16 DIAGNOSIS — M25472 Effusion, left ankle: Secondary | ICD-10-CM | POA: Diagnosis not present

## 2020-05-16 DIAGNOSIS — I10 Essential (primary) hypertension: Secondary | ICD-10-CM | POA: Diagnosis not present

## 2020-05-16 DIAGNOSIS — D500 Iron deficiency anemia secondary to blood loss (chronic): Secondary | ICD-10-CM | POA: Diagnosis not present

## 2020-05-16 DIAGNOSIS — G20 Parkinson's disease: Secondary | ICD-10-CM

## 2020-05-16 DIAGNOSIS — E02 Subclinical iodine-deficiency hypothyroidism: Secondary | ICD-10-CM

## 2020-05-16 NOTE — Progress Notes (Signed)
Location:   [facility] Room Number: 45 Place of Service:  SNF (31) Provider: Lennie Odor Andreus Cure NP  [name], MD  Patient Care Team: [name], MD as PCP - General (Internal Medicine) [name], MD as PCP - Cardiology (Cardiology) [name], MD (Neurosurgery) [name], MD (Neurology) [name], MD (Inactive) as Consulting Physician (Gastroenterology) [name], MD as Consulting Physician (Urology) Kemo Spruce X, NP as Nurse Practitioner (Internal Medicine)  Extended Emergency Contact Information Primary Emergency Contact: [facility] Address: [address], [place] [address] [name] of [place] Phone: [phone] Mobile Phone: [phone] Relation: Spouse Secondary Emergency Contact: [name] Mobile Phone: [phone] Relation: Daughter Preferred language: [name] Interpreter needed? No  Code Status: Full Code Goals of care: Advanced Directive information Advanced Directives 04/19/2020  Does Patient Have a Medical Advance Directive? Yes  Type of Advance Directive Living will  Does patient want to make changes to medical advance directive? No - Patient declined  Copy of [place] in Chart? -  Would patient like information on creating a medical advance directive? -  Pre-existing out of facility DNR order (yellow form or pink MOST form) -     Chief Complaint  Patient presents with  . Acute Visit    Hematuria    HPI:  Pt is a 86 y.o. female seen today for an acute visit for sudden onset gross blood urine in the nephrostomy collection bag, the patient denied abd pain, dysuria, nausea, vomiting, malaise, or fever. Pending Urology eval in am.    Hx of recurrent UTI, obstruction of right ureteropelvic junction due to stone. Erosive arthritis, taking Tylenol 519m bid, Plaquenil. HTN, on ASA 813md, Parkinson's on Sinemet, anemia, on Fe, Edema, on  Furosemide. GERD, on Prevacid. Hypothyroidism, on Levothyroxine 5072mqd.    Past Medical History:  Diagnosis Date  . Anemia    iron deficiency   . Anxiety   . Aortic stenosis    moderate by echo 2021   . Arthritis   . Broken arm    right   . Chronic diastolic CHF (congestive heart failure) ([place] . Chronic pain   . Chronic pain   . Cognitive communication deficit   . Constipation   . Depression   . Family history of adverse reaction to anesthesia    pt. states sister vomits  . Fibromyalgia   . Fracture of thumb 09/01/2015   right  . Fracture, foot 09/01/2015   right  . GERD (gastroesophageal reflux disease)   . H/O hiatal hernia   . History of bronchitis   . History of kidney stones   . Hypercholesterolemia   . Hypertension    dr t turner  . Hypothyroidism   . Neuropathy   . Osteoporosis   . Other lack of coordination   . Parkinson disease ([place] . PVD (peripheral vascular disease) (HCC)    99% stenosis of left anteiror tibial artery, mod stenosis of left distal SFA and popliteal artery followed by Dr. FieOneida Alar RBBB    noted on EKG 2018  . Restless leg   . Restless leg syndrome   . Sepsis ([place]  [facility] of [facility] Shortness of breath    with exertion - chronic   . Sjogren's disease ([place] . SOB (shortness of breath)    chronic due to  diastolic dysfunction, deconditioning, obesity  . Spinal stenosis    lumbar region   . Tremor   . Unsteadiness on feet   . Urinary tract infection   . Visual hallucinations    Past Surgical History:  Procedure Laterality Date  . ABDOMINAL HYSTERECTOMY    . BACK SURGERY     4 back surgeries,   lumbar fusion  . CARDIAC CATHETERIZATION  2006   normal  . CYSTOSCOPY/URETEROSCOPY/HOLMIUM LASER/STENT PLACEMENT Left 01/06/2019   Procedure: CYSTOSCOPY/RETROGRADE/URETEROSCOPY/HOLMIUM LASER/BASKET RETRIEVAL/STENT PLACEMENT;  Surgeon: Ceasar Mons, MD;  Location: [facility];  Service: Urology;  Laterality: Left;  .  EYE SURGERY Bilateral    cateracts  . falls     variious fall, broken wrist,and toes  . FRACTURE SURGERY Right April 2016   Wrist, Pt. fell  . IR NEPHROSTOMY PLACEMENT RIGHT  04/19/2020  . kidney stone removal Left 01/20/2019  . RIGHT/LEFT HEART CATH AND CORONARY ANGIOGRAPHY N/A 02/05/2018   Procedure: RIGHT/LEFT HEART CATH AND CORONARY ANGIOGRAPHY;  Surgeon: Troy Sine, MD;  Location: [place] [facility];  Service: Cardiovascular;  Laterality: N/A;  . SPINAL CORD STIMULATOR INSERTION N/A 09/09/2015   Procedure: LUMBAR SPINAL CORD STIMULATOR INSERTION;  Surgeon: Clydell Hakim, MD;  Location: [place] [facility];  Service: Neurosurgery;  Laterality: N/A;  LUMBAR SPINAL CORD STIMULATOR INSERTION  . SPINE SURGERY  April 2013   Back X's 4  . TOTAL HIP ARTHROPLASTY     right  . WRIST FRACTURE SURGERY Bilateral     Allergies  Allergen Reactions  . Adrenalone   . Lactose Other (See Comments)    abd pain, lactose intolerant  . Morphine Other (See Comments)  . Sulfa Antibiotics Other (See Comments)    Headache, very sick  . Codeine Other (See Comments)    Reaction:  Headaches and nightmares   . Lactose Intolerance (Gi) Nausea And Vomiting  . Latex Rash  . Lyrica [Pregabalin] Swelling and Other (See Comments)    Reaction:  Leg swelling  . Other Other (See Comments)    Pt states that pain medications give her nightmares.    . Plaquenil [Hydroxychloroquine Sulfate] Other (See Comments)    Reaction:  GI upset   . Reglan [Metoclopramide] Other (See Comments)    Reaction:  GI upset   . Requip [Ropinirole Hcl] Other (See Comments)    Reaction:  GI upset   . Septra [Sulfamethoxazole-Trimethoprim] Nausea And Vomiting  . Shellfish Allergy Nausea And Vomiting    Allergies as of 05/16/2020      Reactions   Adrenalone    Lactose Other (See Comments)   abd pain, lactose intolerant   Morphine Other (See Comments)   Sulfa Antibiotics Other (See Comments)   Headache, very sick   Codeine Other (See  Comments)   Reaction:  Headaches and nightmares    Lactose Intolerance (gi) Nausea And Vomiting   Latex Rash   Lyrica [pregabalin] Swelling, Other (See Comments)   Reaction:  Leg swelling   Other Other (See Comments)   Pt states that pain medications give her nightmares.     Plaquenil [hydroxychloroquine Sulfate] Other (See Comments)   Reaction:  GI upset    Reglan [metoclopramide] Other (See Comments)   Reaction:  GI upset    Requip [ropinirole Hcl] Other (See Comments)   Reaction:  GI upset    Septra [sulfamethoxazole-trimethoprim] Nausea And Vomiting   Shellfish Allergy Nausea And Vomiting      Medication List  Accurate as of May 16, 2020 11:59 PM. If you have any questions, ask your nurse or doctor.        STOP taking these medications   multivitamin with minerals Tabs tablet Stopped by: Dalana Pfahler X Kaleyah Labreck, NP   PreserVision AREDS 2 Chew Stopped by: Brylee Mcgreal X Dilpreet Faires, NP   saccharomyces boulardii 250 MG capsule Commonly known as: FLORASTOR Stopped by: Krist Rosenboom X Phelan Goers, NP   Vitamin D 50 MCG (2000 UT) Caps Stopped by: Thompson Mckim X Lashay Osborne, NP     TAKE these medications   acetaminophen 500 MG tablet Commonly known as: TYLENOL Take 500 mg by mouth daily as needed for mild pain.   TYLENOL 500 MG tablet Generic drug: acetaminophen Take 500 mg by mouth 2 (two) times daily.   AeroChamber MV inhaler by Other route. Use as instructed as needed   albuterol 108 (90 Base) MCG/ACT inhaler Commonly known as: VENTOLIN HFA Inhale 2 puffs into the lungs every 6 (six) hours as needed for wheezing or shortness of breath.   aspirin EC 81 MG tablet Take 81 mg by mouth daily.   Calcium 600/Vitamin D3 600-800 MG-UNIT Tabs Generic drug: Calcium Carb-Cholecalciferol Take 1 tablet by mouth daily.   carbidopa-levodopa 25-100 MG tablet Commonly known as: SINEMET IR Take 2 tablets by mouth in the morning and at bedtime.   clotrimazole-betamethasone cream Commonly known as: Lotrisone Apply 1  application topically 2 (two) times daily.   docusate sodium 100 MG capsule Commonly known as: COLACE Take 100 mg by mouth at bedtime.   eucerin cream Apply 1 application topically daily.   ezetimibe 10 MG tablet Commonly known as: ZETIA TAKE 1 TABLET ONCE DAILY.   furosemide 20 MG tablet Commonly known as: LASIX Take 20 mg by mouth daily.   hydrocortisone 2.5 % cream Apply 1 application topically 2 (two) times daily.   hydroxychloroquine 200 MG tablet Commonly known as: PLAQUENIL Take 200 mg by mouth daily.   lansoprazole 30 MG disintegrating tablet Commonly known as: PREVACID SOLUTAB Take 30 mg by mouth daily at 12 noon.   levothyroxine 50 MCG tablet Commonly known as: SYNTHROID Take 50 mcg by mouth daily before breakfast.   lubiprostone 24 MCG capsule Commonly known as: AMITIZA Take 24 mcg by mouth 2 (two) times daily with a meal.   nystatin powder Commonly known as: MYCOSTATIN/NYSTOP Apply topically 2 (two) times daily as needed.   potassium chloride 10 MEQ tablet Commonly known as: KLOR-CON Take 10 mEq by mouth daily.   pramipexole 1 MG tablet Commonly known as: MIRAPEX Take 1 mg by mouth 2 (two) times daily.   QUEtiapine 25 MG tablet Commonly known as: SEROQUEL Take 12.5 mg by mouth every morning. 1/2 tablet to = 12.5 mg QAM   QUEtiapine 25 MG tablet Commonly known as: SEROQUEL Take 25 mg by mouth daily.   rosuvastatin 20 MG tablet Commonly known as: CRESTOR Take 1 tablet (20 mg total) by mouth daily.   senna-docusate 8.6-50 MG tablet Commonly known as: Senokot-S Take 1 tablet by mouth at bedtime.   sertraline 50 MG tablet Commonly known as: ZOLOFT Take 75 mg by mouth at bedtime. 1-1/2 tablets to = 75 mg   Slow Fe 142 (45 Fe) MG Tbcr Generic drug: Ferrous Sulfate Take 1 tablet by mouth. Daily   Systane 0.4-0.3 % Soln Generic drug: Polyethyl Glycol-Propyl Glycol Apply 1 drop to eye 2 (two) times daily as needed (dry eyes).        Review of Systems  Constitutional: Negative for appetite change, fatigue and fever.  HENT: Positive for hearing loss. Negative for congestion and voice change.   Eyes: Negative for visual disturbance.  Respiratory: Negative for cough and shortness of breath.   Cardiovascular: Positive for leg swelling. Negative for chest pain and palpitations.  Gastrointestinal: Negative for abdominal distention, abdominal pain, constipation, diarrhea, nausea and vomiting.  Genitourinary: Positive for hematuria. Negative for difficulty urinating, dysuria, flank pain and urgency.  Musculoskeletal: Positive for arthralgias, back pain, gait problem and myalgias.       Chronic lower back pain.   Skin: Negative for color change.       S/p percutaneous nephrectomy of the right   Neurological: Negative for speech difficulty, weakness and light-headedness.       Memory lapses.   Psychiatric/Behavioral: Negative for hallucinations and sleep disturbance. The patient is not nervous/anxious.     Immunization History  Administered Date(s) Administered  . Influenza Split 09/09/2013  . Influenza, High Dose Seasonal PF 09/12/2018, 09/15/2019  . Influenza,inj,Quad PF,6+ Mos 09/11/2016  . Moderna SARS-COVID-2 Vaccination 12/12/2019, 01/09/2020  . Pneumococcal Conjugate-13 04/15/2014  . Pneumococcal Polysaccharide-23 09/28/2005  . Tdap 09/01/2015  . Zoster Recombinat (Shingrix) 06/11/2018, 09/10/2018   Pertinent  Health Maintenance Due  Topic Date Due  . INFLUENZA VACCINE  07/10/2020  . DEXA SCAN  Completed  . PNA vac Low Risk Adult  Completed   Fall Risk  11/16/2019 04/23/2019 04/09/2019 10/29/2018 10/27/2018  Falls in the past year? 0 0 0 0 1  Comment - - - fell June 2019 -  Number falls in past yr: - 0 0 - 0  Injury with Fall? - 0 0 - 1  Comment - - - - -  Risk for fall due to : - - - - -  Risk for fall due to: Comment - - - - -   Functional Status Survey:    Vitals:   05/16/20 1552  BP: 128/64   Pulse: 84  Resp: 18  Temp: 97.9 F (36.6 C)  SpO2: 95%  Weight: 148 lb 8 oz (67.4 kg)  Height: '5\' 2"'  (1.575 m)   Body mass index is 27.16 kg/m. Physical Exam Vitals and nursing note reviewed.  Constitutional:      Appearance: Normal appearance.  HENT:     Head: Normocephalic and atraumatic.     Mouth/Throat:     Mouth: Mucous membranes are moist.  Eyes:     Extraocular Movements: Extraocular movements intact.     Conjunctiva/sclera: Conjunctivae normal.     Pupils: Pupils are equal, round, and reactive to light.  Cardiovascular:     Rate and Rhythm: Normal rate and regular rhythm.     Heart sounds: Murmur present.  Pulmonary:     Breath sounds: No rhonchi or rales.  Abdominal:     General: Bowel sounds are normal. There is no distension.     Palpations: Abdomen is soft.     Tenderness: There is no abdominal tenderness. There is no right CVA tenderness, left CVA tenderness, guarding or rebound.  Musculoskeletal:        General: No swelling or tenderness.     Cervical back: Normal range of motion and neck supple.     Right lower leg: Edema present.     Left lower leg: Edema present.     Comments: Trace edema BLE.  Arthritic changes in fingers, R>L  Skin:    General: Skin is warm and dry.     Comments: S/p  the right percutaneous nephrostomy.   Neurological:     General: No focal deficit present.     Mental Status: She is alert. Mental status is at baseline.     Motor: No weakness.     Coordination: Coordination normal.     Gait: Gait abnormal.     Comments: Oriented to person, place.   Psychiatric:        Mood and Affect: Mood normal.        Behavior: Behavior normal.     Labs reviewed: Recent Labs    04/21/20 0325 04/22/20 0605 04/22/20 0903 04/23/20 0350 04/24/20 0423 04/25/20 0427  NA 143  --    < > 144 145 146*  K 3.4*  --    < > 3.1* 3.0* 3.8  CL 115*  --    < > 119* 115* 115*  CO2 20*  --    < > 21* 22 26  GLUCOSE 129*  --    < > 80 85 88  BUN  35*  --    < > 33* 26* 16  CREATININE 1.23*  --    < > 1.01* 0.78 0.81  CALCIUM 7.2*  --    < > 7.2* 7.0* 7.6*  MG 3.0* 2.6*  --  2.5*  --   --   PHOS 3.2 2.6  --  2.3*  --   --    < > = values in this interval not displayed.   Recent Labs    04/20/20 0455 04/21/20 0325 04/24/20 0423  AST 117* 50* 32  ALT '18 8 7  ' ALKPHOS 95 95 77  BILITOT 0.5 0.5 0.6  PROT 5.1* 5.4* 4.7*  ALBUMIN 2.3* 2.3* 2.3*   Recent Labs    02/12/20 0000 03/31/20 0000 04/19/20 0200 04/19/20 1442 04/23/20 0350 04/23/20 0350 04/24/20 0423 04/25/20 0427 04/26/20 0000  WBC 7.5   < > 1.0*   < > 12.5*   < > 14.5* 13.8* 10.7  NEUTROABS 4,845  --  0.7*  --   --   --   --   --  7,447  HGB 11.0*   < > 9.1*   < > 7.3*   < > 8.8* 9.4* 9.2*  HCT 33*   < > 29.5*   < > 23.8*   < > 27.1* 29.7* 28*  MCV  --   --  101.4*   < > 100.4*  --  97.5 96.7  --   PLT  --    < > 131*   < > 173   < > 194 226 245   < > = values in this interval not displayed.   Lab Results  Component Value Date   TSH 1.62 09/08/2019   No results found for: HGBA1C Lab Results  Component Value Date   CHOL 118 07/14/2019   HDL 50 07/14/2019   LDLCALC 32 07/14/2019   TRIG 182 (H) 07/14/2019   CHOLHDL 2.4 07/14/2019    Significant Diagnostic Results in last 30 days:  DG Chest 1 View  Result Date: 04/19/2020 CLINICAL DATA:  Fever, tachypnea EXAM: CHEST  1 VIEW COMPARISON:  04/19/2020 FINDINGS: Heart is normal size. Mediastinal contours within normal limits. Spinal stimulator wires remain in stable position. No confluent airspace opacities or edema. Possible trace bilateral effusions. No acute bony abnormality. IMPRESSION: Possible trace bilateral effusions. Electronically Signed   By: Rolm Baptise M.D.   On: 04/19/2020 17:42   DG Chest [facility]  Result Date: 04/19/2020 CLINICAL DATA:  Shortness of breath and fever EXAM: PORTABLE CHEST 1 VIEW COMPARISON:  Chest radiograph 01/15/2019 FINDINGS: The heart size and mediastinal contours are  within normal limits. Both lungs are clear. The visualized skeletal structures are unremarkable. Spinal stimulator leads overlie the midthoracic spine. IMPRESSION: No active disease. Electronically Signed   By: Ulyses Jarred M.D.   On: 04/19/2020 02:35   ECHOCARDIOGRAM COMPLETE  Result Date: 04/20/2020    ECHOCARDIOGRAM REPORT   Patient Name:   [name] Date of Exam: 04/20/2020 Medical Rec #:  [id number]         Height:       62.0 in Accession #:    [id number]        Weight:       157.2 lb Date of Birth:  1931-11-03        BSA:          1.726 m Patient Age:    47 years          BP:           99/58 mmHg Patient Gender: F                 HR:           85 bpm. Exam Location:  Inpatient Procedure: 2D Echo, Color Doppler and Cardiac Doppler Indications:    Acute Respiratory Insufficiency R06.89  History:        Patient has prior history of Echocardiogram examinations, most                 recent 12/30/2019. CHF; Risk Factors:Hypertension and                 Dyslipidemia.  Sonographer:    Raquel Sarna Senior RDCS Referring Phys: [place]  1. Since the last study on 12/30/2019 aortic stenosis is now severe - peak/mean transaortic gradients 66/41 mmHg and dimensionless index 0.21. Referral for a TAVR procedure should be considered.  2. Left ventricular ejection fraction, by estimation, is 60 to 65%. The left ventricle has normal function. The left ventricle has no regional wall motion abnormalities. There is mild concentric left ventricular hypertrophy. Left ventricular diastolic parameters are consistent with Grade I diastolic dysfunction (impaired relaxation). Elevated left atrial pressure.  3. Right ventricular systolic function is normal. The right ventricular size is normal.  4. The mitral valve is normal in structure. Mild mitral valve regurgitation. No evidence of mitral stenosis.  5. The aortic valve is normal in structure. Aortic valve regurgitation is not visualized. Severe aortic valve  stenosis. Aortic valve mean gradient measures 41.0 mmHg.  6. The inferior vena cava is normal in size with greater than 50% respiratory variability, suggesting right atrial pressure of 3 mmHg. FINDINGS  Left Ventricle: Left ventricular ejection fraction, by estimation, is 60 to 65%. The left ventricle has normal function. The left ventricle has no regional wall motion abnormalities. The left ventricular internal cavity size was normal in size. There is  mild concentric left ventricular hypertrophy. Left ventricular diastolic parameters are consistent with Grade I diastolic dysfunction (impaired relaxation). Elevated left atrial pressure. Right Ventricle: The right ventricular size is normal. No increase in right ventricular wall thickness. Right ventricular systolic function is normal. Left Atrium: Left atrial size was normal in size. Right Atrium: Right atrial size was normal in size. Pericardium: There is no evidence of pericardial effusion. Mitral Valve: The mitral valve is normal in structure. There is mild  thickening of the mitral valve leaflet(s). There is mild calcification of the mitral valve leaflet(s). Normal mobility of the mitral valve leaflets. Moderate mitral annular calcification. Mild mitral valve regurgitation. No evidence of mitral valve stenosis. Tricuspid Valve: The tricuspid valve is normal in structure. Tricuspid valve regurgitation is mild . No evidence of tricuspid stenosis. Aortic Valve: The aortic valve is normal in structure.. There is severe thickening and severe calcifcation of the aortic valve. Aortic valve regurgitation is not visualized. Severe aortic stenosis is present. There is severe thickening of the aortic valve. There is severe calcifcation of the aortic valve. Aortic valve mean gradient measures 41.0 mmHg. Aortic valve peak gradient measures 65.3 mmHg. Aortic valve area, by VTI measures 0.85 cm. Pulmonic Valve: The pulmonic valve was normal in structure. Pulmonic valve  regurgitation is not visualized. No evidence of pulmonic stenosis. Aorta: The aortic root is normal in size and structure. Venous: The inferior vena cava is normal in size with greater than 50% respiratory variability, suggesting right atrial pressure of 3 mmHg. IAS/Shunts: No atrial level shunt detected by color flow Doppler.  LEFT VENTRICLE PLAX 2D LVIDd:         3.00 cm  Diastology LVIDs:         2.50 cm  LV e' lateral:   6.64 cm/s LV PW:         1.10 cm  LV E/e' lateral: 20.2 LV IVS:        1.10 cm  LV e' medial:    4.90 cm/s LVOT diam:     2.10 cm  LV E/e' medial:  27.3 LV SV:         88 LV SV Index:   51 LVOT Area:     3.46 cm  RIGHT VENTRICLE RV S prime:     6.96 cm/s TAPSE (M-mode): 2.2 cm LEFT ATRIUM             Index LA diam:        3.80 cm 2.20 cm/m LA Vol (A2C):   61.6 ml 35.70 ml/m LA Vol (A4C):   76.9 ml 44.56 ml/m LA Biplane Vol: 75.2 ml 43.58 ml/m  AORTIC VALVE AV Area (Vmax):    0.72 cm AV Area (Vmean):   0.73 cm AV Area (VTI):     0.85 cm AV Vmax:           404.00 cm/s AV Vmean:          299.000 cm/s AV VTI:            1.030 m AV Peak Grad:      65.3 mmHg AV Mean Grad:      41.0 mmHg LVOT Vmax:         84.20 cm/s LVOT Vmean:        63.400 cm/s LVOT VTI:          0.254 m LVOT/AV VTI ratio: 0.25  AORTA Ao Root diam: 3.40 cm Ao Asc diam:  3.10 cm MITRAL VALVE MV Area (PHT): 3.72 cm     SHUNTS MV Decel Time: 204 msec     Systemic VTI:  0.25 m MV E velocity: 134.00 cm/s  Systemic Diam: 2.10 cm MV A velocity: 131.00 cm/s MV E/A ratio:  1.02 Ena Dawley MD Electronically signed by Ena Dawley MD Signature Date/Time: 04/20/2020/11:59:59 AM    Final    CT RENAL STONE STUDY  Result Date: 04/19/2020 CLINICAL DATA:  Severe sepsis.  Rule out hydronephrosis EXAM: CT ABDOMEN AND PELVIS  WITHOUT CONTRAST TECHNIQUE: Multidetector CT imaging of the abdomen and pelvis was performed following the standard protocol without IV contrast. COMPARISON:  01/02/2019 FINDINGS: Lower chest: Trace pleural  effusions and lower lobe atelectasis. Extensive coronary atherosclerosis. Small hiatal hernia. Hepatobiliary: No focal liver abnormality.No evidence of biliary obstruction or stone. Pancreas: Mild generalized atrophy. Spleen: Unremarkable. Adrenals/Urinary Tract: Negative adrenals. Right hydroureteronephrosis due to a obstructing 11 x 7 mm stone at the pelvic inlet. Irregular 7 mm interpolar calculus in the left kidney. Two small lower pole calculi on the right. Unremarkable bladder. Stomach/Bowel:  No obstruction. Moderate rectal stool. Vascular/Lymphatic: Atherosclerosis no acute vascular abnormality. No mass or adenopathy. Reproductive:Hysterectomy Other: No ascites or pneumoperitoneum. Musculoskeletal: Lumbar fusion from L2-S1. Severe adjacent segment degeneration at T12-L1 and especially L1-2 where there is retrolisthesis and spinal/foraminal impingement. A spinal stimulator is present. IMPRESSION: 1. Obstructing 11 x 7 mm stone in the right ureter at the level of the pelvic inlet. 2. Bilateral nephrolithiasis. 3. Incidental findings are noted above. Electronically Signed   By: Monte Fantasia M.D.   On: 04/19/2020 06:29   IR NEPHROSTOMY PLACEMENT RIGHT  Result Date: 04/19/2020 INDICATION: Urinary sepsis secondary to right-sided hydronephrosis. EXAM: 1. ULTRASOUND GUIDANCE FOR PUNCTURE OF THE RIGHT RENAL COLLECTING SYSTEM 2. RIGHT PERCUTANEOUS NEPHROSTOMY TUBE PLACEMENT. COMPARISON:  CT abdomen pelvis-earlier same day; 01/02/2019 MEDICATIONS: Rocephin 1 gm IV; The antibiotic was administered in an appropriate time frame prior to skin puncture. ANESTHESIA/SEDATION: Moderate (conscious) sedation was employed during this procedure. A total of Versed 0.5 mg was administered intravenously. Moderate Sedation Time: 14 minutes. The patient's level of consciousness and vital signs were monitored continuously by radiology nursing throughout the procedure under my direct supervision. CONTRAST:  10 mL Isovue 300 -  administered into the renal collecting system FLUOROSCOPY TIME:  1 minute, 36 seconds (79 mGy) COMPLICATIONS: None immediate. PROCEDURE: The procedure, risks, benefits, and alternatives were explained to the patient. Questions regarding the procedure were encouraged and answered. The patient understands and consents to the procedure. A timeout was performed prior to the initiation of the procedure. The right flank region was prepped and draped in the usual sterile fashion and a sterile drape was applied covering the operative field. A sterile gown and sterile gloves were used for the procedure. Local anesthesia was provided with 1% Lidocaine with epinephrine. Ultrasound was used to localize the right kidney. Under direct ultrasound guidance, a 20 gauge needle was advanced into the renal collecting system. An ultrasound image documentation was performed. Access within the collecting system was confirmed with the efflux of urine followed by limited contrast injection. Over a Nitrex wire, the tract was dilated with an Accustick stent. Next, under intermittent fluoroscopic guidance and over a short Amplatz wire, the track was dilated ultimately allowing placement of a 10-French percutaneous nephrostomy catheter which was advanced to the level of the renal pelvis where the coil was formed and locked. Contrast was injected and several spot fluoroscopic images were obtained in various obliquities. The catheter was secured at the skin with a Prolene retention suture and stat lock device and connected to a gravity bag was placed. Dressings were applied. The patient tolerated procedure well without immediate postprocedural complication. FINDINGS: Ultrasound scanning demonstrates a moderate to severely dilated right collecting system. Under a combination of ultrasound and fluoroscopic guidance, a posterior inferior calix was targeted allowing placement of a 10-French percutaneous nephrostomy catheter with end coiled and locked  within the renal pelvis. Contrast injection confirmed appropriate positioning. Limited contrast injection suggest  questioned right proximal ureteral stone likely represents a calcified phlebolith as was suggested on more remote abdominal CT performed 01/02/2019. IMPRESSION: 1. Successful ultrasound and fluoroscopic guided placement of a right sided 10 [place] PCN. 2. Limited contrast injection confirms questioned proximal right ureteral/UPJ stone likely represents a calcified phleboliths as was suggested on more remote abdominal CT 01/02/2019. PLAN: As the etiology of the patient's right-sided urinary obstruction remains indeterminate, the patient may return for right-sided antegrade nephrostogram with potential right-sided ureteral stent placement in 2-3 weeks as indicated. Electronically Signed   By: Sandi Mariscal M.D.   On: 04/19/2020 10:47    Assessment/Plan: Hematuria Hx of recurrent UTI, s/p nephrectomy due to obstruction of the right ureter due to stone.  05/17/20 Urology: Cephalexin 542m bid x 7 days, pending UA C/S, update CBC/diff, CMP/eGFR  Iron deficiency anemia Hgb 9.6 05/03/20, continue Fe  Swelling of both ankles Trace, continue Furosemide.   Hypothyroidism Stable, continue Levothyroxine, TSH 1.62 09/08/19  Parkinson's disease Stable, continue Sinemet.   Erosive (osteo)arthritis Stable, continue Plaquenil, Tylenol.   GERD (gastroesophageal reflux disease) Stable, continue Prevacid.   Hypertension Blood pressure is in control, continue ASA 872mqd.     Family/ staff Communication: plan of care reviewed with the patient and charge nurse.   Labs/tests ordered:  Pending UA C/S, update CBC/diff, CMP/eGFR  Time spend 25 minutes.

## 2020-05-17 DIAGNOSIS — N201 Calculus of ureter: Secondary | ICD-10-CM | POA: Diagnosis not present

## 2020-05-17 DIAGNOSIS — N132 Hydronephrosis with renal and ureteral calculous obstruction: Secondary | ICD-10-CM | POA: Diagnosis not present

## 2020-05-18 ENCOUNTER — Encounter: Payer: Self-pay | Admitting: Nurse Practitioner

## 2020-05-18 DIAGNOSIS — R319 Hematuria, unspecified: Secondary | ICD-10-CM

## 2020-05-18 HISTORY — DX: Hematuria, unspecified: R31.9

## 2020-05-18 NOTE — Assessment & Plan Note (Signed)
Stable, continue Sinemet 

## 2020-05-18 NOTE — Assessment & Plan Note (Signed)
Trace, continue Furosemide.  

## 2020-05-18 NOTE — Assessment & Plan Note (Signed)
Hgb 9.6 05/03/20, continue Fe

## 2020-05-18 NOTE — Assessment & Plan Note (Addendum)
Hx of recurrent UTI, s/p nephrectomy due to obstruction of the right ureter due to stone.  05/17/20 Urology: Cephalexin 542m bid x 7 days, pending UA C/S, update CBC/diff, CMP/eGFR

## 2020-05-18 NOTE — Assessment & Plan Note (Signed)
Stable, continue Plaquenil, Tylenol.

## 2020-05-18 NOTE — Assessment & Plan Note (Signed)
Stable, continue Levothyroxine, TSH 1.62 09/08/19

## 2020-05-18 NOTE — Assessment & Plan Note (Signed)
Blood pressure is in control, continue ASA 81mg  qd.

## 2020-05-18 NOTE — Assessment & Plan Note (Signed)
Stable, continue Prevacid.  

## 2020-05-23 ENCOUNTER — Encounter (HOSPITAL_COMMUNITY): Payer: Self-pay | Admitting: Urology

## 2020-05-23 NOTE — Anesthesia Preprocedure Evaluation (Addendum)
Anesthesia Evaluation  Patient identified by MRN, date of birth, ID band Patient awake    Reviewed: Allergy & Precautions, NPO status , Patient's Chart, lab work & pertinent test results  History of Anesthesia Complications Negative for: history of anesthetic complications  Airway Mallampati: II  TM Distance: >3 FB Neck ROM: Full    Dental no notable dental hx.    Pulmonary neg pulmonary ROS,    Pulmonary exam normal        Cardiovascular hypertension, + Peripheral Vascular Disease and +CHF  Normal cardiovascular exam  TTE 04/2020: EF 60-65%, mild LVH, grade I DD, mild MR, severe AS (mean gradient 56mmHg, area 0.85)    Neuro/Psych Anxiety Depression Schizophrenia Lumbar stenosis, Parkinson's dx    GI/Hepatic Neg liver ROS, hiatal hernia, GERD  ,  Endo/Other  Hypothyroidism   Renal/GU negative Renal ROS  negative genitourinary   Musculoskeletal  (+) Arthritis , Fibromyalgia -  Abdominal   Peds  Hematology  (+) anemia ,   Anesthesia Other Findings Day of surgery medications reviewed with patient.  Reproductive/Obstetrics negative OB ROS                            Anesthesia Physical Anesthesia Plan  ASA: IV  Anesthesia Plan: General   Post-op Pain Management:    Induction: Intravenous  PONV Risk Score and Plan: 3 and Treatment may vary due to age or medical condition and Ondansetron  Airway Management Planned: LMA  Additional Equipment: Arterial line  Intra-op Plan:   Post-operative Plan: Extubation in OR  Informed Consent: I have reviewed the patients History and Physical, chart, labs and discussed the procedure including the risks, benefits and alternatives for the proposed anesthesia with the patient or authorized representative who has indicated his/her understanding and acceptance.     Dental advisory given  Plan Discussed with: CRNA  Anesthesia Plan Comments:         Anesthesia Quick Evaluation

## 2020-05-24 ENCOUNTER — Ambulatory Visit (HOSPITAL_COMMUNITY): Payer: Medicare Other

## 2020-05-24 ENCOUNTER — Ambulatory Visit (HOSPITAL_COMMUNITY): Payer: Medicare Other | Admitting: Physician Assistant

## 2020-05-24 ENCOUNTER — Encounter (HOSPITAL_COMMUNITY): Payer: Self-pay | Admitting: Urology

## 2020-05-24 ENCOUNTER — Ambulatory Visit (HOSPITAL_COMMUNITY)
Admission: RE | Admit: 2020-05-24 | Discharge: 2020-05-24 | Disposition: A | Discharge status: Home / Self Care | Payer: Medicare Other | Attending: Urology | Admitting: Urology

## 2020-05-24 ENCOUNTER — Other Ambulatory Visit: Payer: Self-pay

## 2020-05-24 ENCOUNTER — Encounter (HOSPITAL_COMMUNITY)
Admission: RE | Disposition: A | Discharge status: Home / Self Care | Payer: Self-pay | Source: Home / Self Care | Attending: Urology

## 2020-05-24 DIAGNOSIS — N202 Calculus of kidney with calculus of ureter: Secondary | ICD-10-CM | POA: Insufficient documentation

## 2020-05-24 DIAGNOSIS — E7800 Pure hypercholesterolemia, unspecified: Secondary | ICD-10-CM | POA: Insufficient documentation

## 2020-05-24 DIAGNOSIS — Z87442 Personal history of urinary calculi: Secondary | ICD-10-CM | POA: Insufficient documentation

## 2020-05-24 DIAGNOSIS — M797 Fibromyalgia: Secondary | ICD-10-CM | POA: Insufficient documentation

## 2020-05-24 DIAGNOSIS — D509 Iron deficiency anemia, unspecified: Secondary | ICD-10-CM | POA: Insufficient documentation

## 2020-05-24 DIAGNOSIS — Z6825 Body mass index (BMI) 25.0-25.9, adult: Secondary | ICD-10-CM | POA: Insufficient documentation

## 2020-05-24 DIAGNOSIS — G20 Parkinson's disease: Secondary | ICD-10-CM | POA: Insufficient documentation

## 2020-05-24 DIAGNOSIS — E669 Obesity, unspecified: Secondary | ICD-10-CM | POA: Insufficient documentation

## 2020-05-24 DIAGNOSIS — I350 Nonrheumatic aortic (valve) stenosis: Secondary | ICD-10-CM | POA: Insufficient documentation

## 2020-05-24 DIAGNOSIS — G2581 Restless legs syndrome: Secondary | ICD-10-CM | POA: Insufficient documentation

## 2020-05-24 DIAGNOSIS — Z91040 Latex allergy status: Secondary | ICD-10-CM | POA: Insufficient documentation

## 2020-05-24 DIAGNOSIS — Z885 Allergy status to narcotic agent status: Secondary | ICD-10-CM | POA: Insufficient documentation

## 2020-05-24 DIAGNOSIS — I739 Peripheral vascular disease, unspecified: Secondary | ICD-10-CM | POA: Insufficient documentation

## 2020-05-24 DIAGNOSIS — I110 Hypertensive heart disease with heart failure: Secondary | ICD-10-CM | POA: Insufficient documentation

## 2020-05-24 DIAGNOSIS — Z882 Allergy status to sulfonamides status: Secondary | ICD-10-CM | POA: Insufficient documentation

## 2020-05-24 DIAGNOSIS — M3500 Sicca syndrome, unspecified: Secondary | ICD-10-CM | POA: Insufficient documentation

## 2020-05-24 DIAGNOSIS — Z87440 Personal history of urinary (tract) infections: Secondary | ICD-10-CM | POA: Insufficient documentation

## 2020-05-24 DIAGNOSIS — Z8249 Family history of ischemic heart disease and other diseases of the circulatory system: Secondary | ICD-10-CM | POA: Insufficient documentation

## 2020-05-24 DIAGNOSIS — I5032 Chronic diastolic (congestive) heart failure: Secondary | ICD-10-CM | POA: Diagnosis not present

## 2020-05-24 DIAGNOSIS — Z881 Allergy status to other antibiotic agents status: Secondary | ICD-10-CM | POA: Insufficient documentation

## 2020-05-24 DIAGNOSIS — Z888 Allergy status to other drugs, medicaments and biological substances status: Secondary | ICD-10-CM | POA: Insufficient documentation

## 2020-05-24 DIAGNOSIS — Z20822 Contact with and (suspected) exposure to covid-19: Secondary | ICD-10-CM | POA: Insufficient documentation

## 2020-05-24 DIAGNOSIS — M1990 Unspecified osteoarthritis, unspecified site: Secondary | ICD-10-CM | POA: Insufficient documentation

## 2020-05-24 DIAGNOSIS — E039 Hypothyroidism, unspecified: Secondary | ICD-10-CM | POA: Insufficient documentation

## 2020-05-24 HISTORY — DX: Spinal stenosis, site unspecified: M48.00

## 2020-05-24 HISTORY — DX: Restless legs syndrome: G25.81

## 2020-05-24 HISTORY — DX: Unsteadiness on feet: R26.81

## 2020-05-24 HISTORY — DX: Urinary tract infection, site not specified: N39.0

## 2020-05-24 HISTORY — DX: Cognitive communication deficit: R41.841

## 2020-05-24 HISTORY — DX: Constipation, unspecified: K59.00

## 2020-05-24 HISTORY — DX: Sepsis, unspecified organism: A41.9

## 2020-05-24 HISTORY — DX: Visual hallucinations: R44.1

## 2020-05-24 HISTORY — DX: Other chronic pain: G89.29

## 2020-05-24 HISTORY — PX: CYSTOSCOPY/URETEROSCOPY/HOLMIUM LASER/STENT PLACEMENT: SHX6546

## 2020-05-24 HISTORY — DX: Depression, unspecified: F32.A

## 2020-05-24 LAB — BASIC METABOLIC PANEL
Anion gap: 10 (ref 5–15)
BUN: 12 mg/dL (ref 8–23)
CO2: 24 mmol/L (ref 22–32)
Calcium: 8.9 mg/dL (ref 8.9–10.3)
Chloride: 107 mmol/L (ref 98–111)
Creatinine, Ser: 0.81 mg/dL (ref 0.44–1.00)
GFR calc Af Amer: >60 mL/min (ref >60)
GFR calc non Af Amer: >60 mL/min (ref >60)
Glucose, Bld: 100 mg/dL — ABNORMAL HIGH (ref 70–99)
Potassium: 3.5 mmol/L (ref 3.5–5.1)
Sodium: 141 mmol/L (ref 135–145)

## 2020-05-24 LAB — CBC
HCT: 30.9 % — ABNORMAL LOW (ref 36.0–46.0)
Hemoglobin: 9.8 g/dL — ABNORMAL LOW (ref 12.0–15.0)
MCH: 31.2 pg (ref 26.0–34.0)
MCHC: 31.7 g/dL (ref 30.0–36.0)
MCV: 98.4 fL (ref 80.0–100.0)
Platelets: 286 10*3/uL (ref 150–400)
RBC: 3.14 MIL/uL — ABNORMAL LOW (ref 3.87–5.11)
RDW: 13.8 % (ref 11.5–15.5)
WBC: 7.8 10*3/uL (ref 4.0–10.5)
nRBC: 0 % (ref 0.0–0.2)

## 2020-05-24 LAB — SARS CORONAVIRUS 2 BY RT PCR (HOSPITAL ORDER, PERFORMED IN ~~LOC~~ HOSPITAL LAB): SARS Coronavirus 2: NEGATIVE

## 2020-05-24 SURGERY — CYSTOSCOPY/URETEROSCOPY/HOLMIUM LASER/STENT PLACEMENT
Anesthesia: General | Laterality: Bilateral

## 2020-05-24 MED ORDER — PHENYLEPHRINE 40 MCG/ML (10ML) SYRINGE FOR IV PUSH (FOR BLOOD PRESSURE SUPPORT)
PREFILLED_SYRINGE | INTRAVENOUS | Status: DC | PRN
  Administered 2020-05-24: 40 ug via INTRAVENOUS
  Administered 2020-05-24 (×2): 80 ug via INTRAVENOUS

## 2020-05-24 MED ORDER — LIDOCAINE 2% (20 MG/ML) 5 ML SYRINGE
INTRAMUSCULAR | Status: AC
  Filled 2020-05-24: qty 10

## 2020-05-24 MED ORDER — FENTANYL CITRATE (PF) 100 MCG/2ML IJ SOLN
INTRAMUSCULAR | Status: AC
  Filled 2020-05-24: qty 2

## 2020-05-24 MED ORDER — PHENYLEPHRINE 40 MCG/ML (10ML) SYRINGE FOR IV PUSH (FOR BLOOD PRESSURE SUPPORT)
PREFILLED_SYRINGE | INTRAVENOUS | Status: AC
  Filled 2020-05-24: qty 10

## 2020-05-24 MED ORDER — DEXAMETHASONE SODIUM PHOSPHATE 10 MG/ML IJ SOLN
INTRAMUSCULAR | Status: DC | PRN
  Administered 2020-05-24: 8 mg via INTRAVENOUS

## 2020-05-24 MED ORDER — PHENYLEPHRINE HCL-NACL 10-0.9 MG/250ML-% IV SOLN
INTRAVENOUS | Status: DC | PRN
  Administered 2020-05-24: 25 ug/min via INTRAVENOUS

## 2020-05-24 MED ORDER — HYDROCODONE-ACETAMINOPHEN 5-325 MG PO TABS: 1.0000 | ORAL_TABLET | ORAL | 0 refills | Status: DC | PRN

## 2020-05-24 MED ORDER — IOHEXOL 300 MG/ML  SOLN
INTRAMUSCULAR | Status: DC | PRN
  Administered 2020-05-24: 28 mL

## 2020-05-24 MED ORDER — ORAL CARE MOUTH RINSE: 15.0000 mL | Freq: Once | OROMUCOSAL | Status: AC

## 2020-05-24 MED ORDER — PROPOFOL 10 MG/ML IV BOLUS
INTRAVENOUS | Status: DC | PRN
  Administered 2020-05-24 (×2): 20 mg via INTRAVENOUS
  Administered 2020-05-24: 40 mg via INTRAVENOUS

## 2020-05-24 MED ORDER — LACTATED RINGERS IV SOLN: INTRAVENOUS | Status: DC

## 2020-05-24 MED ORDER — ONDANSETRON HCL 4 MG/2ML IJ SOLN
INTRAMUSCULAR | Status: AC
  Filled 2020-05-24: qty 2

## 2020-05-24 MED ORDER — FENTANYL CITRATE (PF) 100 MCG/2ML IJ SOLN: 25.0000 ug | INTRAMUSCULAR | Status: DC | PRN

## 2020-05-24 MED ORDER — CIPROFLOXACIN IN D5W 400 MG/200ML IV SOLN
400.0000 mg | Freq: Once | INTRAVENOUS | Status: AC
  Administered 2020-05-24: 400 mg via INTRAVENOUS
  Filled 2020-05-24: qty 200

## 2020-05-24 MED ORDER — PROMETHAZINE HCL 25 MG/ML IJ SOLN: 6.2500 mg | INTRAMUSCULAR | Status: DC | PRN

## 2020-05-24 MED ORDER — ACETAMINOPHEN 500 MG PO TABS
1000.0000 mg | ORAL_TABLET | Freq: Once | ORAL | Status: AC
  Administered 2020-05-24: 1000 mg via ORAL
  Filled 2020-05-24: qty 2

## 2020-05-24 MED ORDER — LIDOCAINE 2% (20 MG/ML) 5 ML SYRINGE
INTRAMUSCULAR | Status: DC | PRN
  Administered 2020-05-24: 60 mg via INTRAVENOUS

## 2020-05-24 MED ORDER — DEXAMETHASONE SODIUM PHOSPHATE 10 MG/ML IJ SOLN
INTRAMUSCULAR | Status: AC
  Filled 2020-05-24: qty 1

## 2020-05-24 MED ORDER — CEPHALEXIN 500 MG PO CAPS
500.0000 mg | ORAL_CAPSULE | Freq: Two times a day (BID) | ORAL | 0 refills | Status: AC
Start: 2020-05-24 — End: 2020-05-27

## 2020-05-24 MED ORDER — EPHEDRINE 5 MG/ML INJ
INTRAVENOUS | Status: AC
  Filled 2020-05-24: qty 10

## 2020-05-24 MED ORDER — FENTANYL CITRATE (PF) 100 MCG/2ML IJ SOLN
INTRAMUSCULAR | Status: DC | PRN
  Administered 2020-05-24 (×2): 25 ug via INTRAVENOUS
  Administered 2020-05-24: 50 ug via INTRAVENOUS

## 2020-05-24 MED ORDER — ETOMIDATE 2 MG/ML IV SOLN
INTRAVENOUS | Status: DC | PRN
Start: 2020-05-24 — End: 2020-05-24
  Administered 2020-05-24: 4 mg via INTRAVENOUS
  Administered 2020-05-24: 8 mg via INTRAVENOUS

## 2020-05-24 MED ORDER — ONDANSETRON HCL 4 MG/2ML IJ SOLN
INTRAMUSCULAR | Status: DC | PRN
  Administered 2020-05-24: 4 mg via INTRAVENOUS

## 2020-05-24 MED ORDER — SODIUM CHLORIDE 0.9 % IR SOLN
Status: DC | PRN
  Administered 2020-05-24 (×2): 3000 mL

## 2020-05-24 MED ORDER — PROPOFOL 10 MG/ML IV BOLUS
INTRAVENOUS | Status: AC
  Filled 2020-05-24: qty 20

## 2020-05-24 MED ORDER — CHLORHEXIDINE GLUCONATE 0.12 % MT SOLN
15.0000 mL | Freq: Once | OROMUCOSAL | Status: AC
  Administered 2020-05-24: 15 mL via OROMUCOSAL

## 2020-05-24 SURGICAL SUPPLY — 23 items
BAG URO CATCHER STRL LF (MISCELLANEOUS) ×3 IMPLANT
BASKET ZERO TIP NITINOL 2.4FR (BASKET) ×2 IMPLANT
BSKT STON RTRVL ZERO TP 2.4FR (BASKET) ×1
CATH URET 5FR 28IN OPEN ENDED (CATHETERS) ×3 IMPLANT
CLOTH BEACON ORANGE TIMEOUT ST (SAFETY) ×3 IMPLANT
DRSG TEGADERM 4X4.75 (GAUZE/BANDAGES/DRESSINGS) ×2 IMPLANT
EXTRACTOR STONE NITINOL NGAGE (UROLOGICAL SUPPLIES) IMPLANT
FIBER LASER FLEXIVA 365 (UROLOGICAL SUPPLIES) IMPLANT
FIBER LASER TRAC TIP (UROLOGICAL SUPPLIES) ×2 IMPLANT
GAUZE SPONGE 2X2 8PLY STRL LF (GAUZE/BANDAGES/DRESSINGS) IMPLANT
GLOVE BIOGEL M STRL SZ7.5 (GLOVE) ×3 IMPLANT
GOWN STRL REUS W/TWL XL LVL3 (GOWN DISPOSABLE) ×3 IMPLANT
GUIDEWIRE ZIPWRE .038 STRAIGHT (WIRE) ×3 IMPLANT
KIT TURNOVER KIT A (KITS) IMPLANT
MANIFOLD NEPTUNE II (INSTRUMENTS) ×3 IMPLANT
SHEATH URETERAL 12FRX35CM (MISCELLANEOUS) IMPLANT
SPONGE GAUZE 2X2 STER 10/PKG (GAUZE/BANDAGES/DRESSINGS) ×2
STENT URET 6FRX24 CONTOUR (STENTS) ×4 IMPLANT
STENT URET 6FRX26 CONTOUR (STENTS) IMPLANT
TRAY CYSTO PACK (CUSTOM PROCEDURE TRAY) ×3 IMPLANT
TUBING CONNECTING 10 (TUBING) ×2 IMPLANT
TUBING CONNECTING 10' (TUBING) ×1
TUBING UROLOGY SET (TUBING) ×3 IMPLANT

## 2020-05-24 NOTE — Anesthesia Procedure Notes (Addendum)
Arterial Line Insertion Start/End6/15/2021 12:30 PM, 05/24/2020 12:35 PM Performed by: Brennan Bailey, MD, Silas Sacramento, CRNA, CRNA  Patient location: Pre-op. Preanesthetic checklist: patient identified, IV checked, site marked, risks and benefits discussed, surgical consent, monitors and equipment checked, pre-op evaluation, timeout performed and anesthesia consent Lidocaine 1% used for infiltration Right, radial was placed Catheter size: 20 Fr Hand hygiene performed , maximum sterile barriers used  and Seldinger technique used Allen's test indicative of satisfactory collateral circulation Attempts: 1 Procedure performed without using ultrasound guided technique. Following insertion, dressing applied and Biopatch. Post procedure assessment: normal and unchanged  Patient tolerated the procedure well with no immediate complications.

## 2020-05-24 NOTE — Op Note (Signed)
Operative Note  Preoperative diagnosis:  1.  Obstructing 11 mm right distal ureteral calculus 2.  Bilateral nonobstructing renal stones  Postoperative diagnosis: 1.  Obstructing and impacted 11 mm right distal ureteral calculus 2.  Bilateral nonobstructing renal stones  Procedure(s): 1.  Cystoscopy with bilateral ureteroscopy, bilateral holmium laser lithotripsy and bilateral JJ stent placement 2.  Bilateral retrograde pyelograms with intraoperative interpretation of fluoroscopic imaging  Surgeon: Ellison Hughs, MD  Assistants:  None  Anesthesia:  General  Complications:  None  EBL: Less than 5 mL  Specimens: 1.  Right ureteral stone fragments 2.  Her previously placed right percutaneous nephrostomy tube was removed intact, inspected and discarded  Drains/Catheters: 1.  Bilateral 6 French, 24 cm JJ stents without tethers  Intraoperative findings:   1. Right retrograde pyelogram revealed a large filling defect within the distal aspects of the right ureter, consistent with her obstructing stone seen on recent cross-sectional imaging.  The proximal aspects of the right collecting system was uniformly dilated with no other obvious filling defects 2. Solitary left collecting system with no filling defects or dilation involving the left ureter or left renal pelvis seen on retrograde pyelogram 3. Severely obstructing and impacted 11 mm distal ureteral calculus with circumferential ureteral edema and excoriation following laser lithotripsy.  Indication:  [name] is a 86 y.o. female recently treated for urosepsis due to an obstructing 11 mm right distal ureteral calculus that required percutaneous nephrostomy tube placement.  Cross-sectional imaging performed in May revealed bilateral nonobstructing renal stones in addition to her obstructing right distal ureteral stone.  She has been consented for the above procedures, voices understanding and wishes to  proceed.  Description of procedure:  After informed consent was obtained, the patient was brought to the operating room and general LMA anesthesia was administered. The patient was then placed in the dorsolithotomy position and prepped and draped in the usual sterile fashion. A timeout was performed. A 23 French rigid cystoscope was then inserted into the urethral meatus and advanced into the bladder under direct vision. A complete bladder survey revealed no intravesical pathology.  A 5 French ureteral catheter was then inserted into the right ureteral orifice and a retrograde pyelogram was obtained, with the findings listed above.  A Glidewire was then used to intubate the lumen of the ureteral catheter and was advanced up to the right renal pelvis, under fluoroscopic guidance.  The catheter was then removed, leaving the wire in place.  A semirigid ureteroscope was then advanced alongside the Glidewire and into position within the distal aspects of the right ureter.  Her stone was quickly identified and found to be severely obstructing and impacting the right ureteral mucosa.  A 200 m holmium laser was then used to fracture the stone into numerous smaller pieces that were eventually extracted using a 0 tip basket.  There is a significant amount of circumferential edema and excoriation involving the ureteral mucosa at the site of the impacted stone.  A flexible ureteroscope was then advanced up the right ureter and into the right renal pelvis were several small stones were identified and subsequently dusted with the 200 m holmium laser.  The flexible ureteroscope was then removed under direct vision, leaving the Glidewire in place.  A 6 French, 24 cm JJ stent was then placed over the wire and into good position within the right collecting system, confirming placement via fluoroscopy.  A 5 French ureteral catheter was then inserted into the left ureteral orifice and a  retrograde pyelogram was obtained,  with the findings listed above.  A Glidewire was then used to intubate the lumen of the ureteral catheter and was advanced up to the left renal pelvis, under fluoroscopic guidance.  The catheter was then removed, leaving the wire in place.  A flexible ureteroscope was then advanced over the wire and into position within the left renal pelvis.  Multiple 3 to 4 mm stones were seen in a midpole calyx that were subsequently dusted with the 200 m holmium laser.  The Glidewire was then replaced through the flexible ureteroscope, which was then removed under direct vision, revealing no ureteral trauma.  A 6 French, 24 cm JJ stent was then placed over the wire and into good position within the left collecting system, confirming placement via fluoroscopy.  The patient's bladder was then completely drained and all stone fragments were removed.  A right sided nephrostomy tube was then removed intact, inspected and discarded.  A single shot of fluoroscopy was obtained to confirm that her right ureteral stent was in good position, which it was.  She tolerated the procedure well and was transferred to the postanesthesia in stable condition.  Plan: Due to the degree of ureteral trauma from her large obstructing right ureteral stone, I will have her follow-up in approximately 6 weeks for office cystoscopy and stent removal.

## 2020-05-24 NOTE — Transfer of Care (Signed)
Immediate Anesthesia Transfer of Care Note  Patient: [name]  Procedure(s) Performed: CYSTOSCOPY/RETROGRADE/URETEROSCOPY/HOLMIUM LASER/STENT PLACEMENT (Bilateral )  Patient Location: PACU  Anesthesia Type:General  Level of Consciousness: awake, drowsy, patient cooperative and responds to stimulation  Airway & Oxygen Therapy: Patient Spontanous Breathing and Patient connected to face mask oxygen  Post-op Assessment: Report given to RN and Post -op Vital signs reviewed and stable  Post vital signs: Reviewed and stable  Last Vitals:  Vitals Value Taken Time  BP 146/63   Temp 98.6   Pulse 67   Resp 12   SpO2 100     Last Pain:  Vitals:   05/24/20 1002  TempSrc: Oral  PainSc: 0-No pain      Patients Stated Pain Goal: 4 (16/10/96 0454)  Complications: No complications documented.

## 2020-05-24 NOTE — H&P (Signed)
Urology Preoperative H&P   Chief Complaint: Bilateral kidney stones  History of Present Illness: [name] is a 86 y.o. female with a long history of kidney stones.  She was recently hospitalized due to E. Coli urosepsis from an obstructing right distal ureteral stone, s/p right PCN placement on 04/19/20.  The patient was seen in the office on 05/17/2020 due to increased confusion and gross hematuria draining from her nephrostomy bag.  She was found to have multiple species grew out of her urine culture and was started on an empiric course of Keflex.  Her hematuria since improved along with her confusion.  She continues to have right-sided discomfort from her PCN, but it is manageable with p.o. pain medication.  She is here today for definitive treatment of her bilateral kidney stones.  Past Medical History:  Diagnosis Date  . Anemia    iron deficiency   . Anxiety   . Aortic stenosis    severe by echo 2021   . Arthritis   . Broken arm    right   . Chronic diastolic CHF (congestive heart failure) ([facility])   . Chronic pain   . Cognitive communication deficit   . Constipation   . Depression   . Family history of adverse reaction to anesthesia    pt. states sister vomits  . Fibromyalgia   . GERD (gastroesophageal reflux disease)   . H/O hiatal hernia   . History of bronchitis   . History of kidney stones   . Hypercholesterolemia   . Hypertension    dr t turner  . Hypothyroidism   . Neuropathy   . Osteoporosis   . Parkinson disease ([facility])   . PVD (peripheral vascular disease) ([facility])    99% stenosis of left anteiror tibial artery, mod stenosis of left distal SFA and popliteal artery followed by [name]  . RBBB    noted on EKG 2018  . Restless leg syndrome   . Sepsis ([place])    hx of due to [facility]  . Shortness of breath    with exertion - chronic   . Sjogren's disease ([place])   . SOB (shortness of breath)    chronic due to diastolic dysfunction, deconditioning, obesity  . Spinal  stenosis    lumbar region   . Tremor   . Unsteadiness on feet   . Urinary tract infection   . Visual hallucinations     Past Surgical History:  Procedure Laterality Date  . ABDOMINAL HYSTERECTOMY    . BACK SURGERY     4 back surgeries,   lumbar fusion  . CARDIAC CATHETERIZATION  2006   normal  . CYSTOSCOPY/URETEROSCOPY/HOLMIUM LASER/STENT PLACEMENT Left 01/06/2019   Procedure: CYSTOSCOPY/RETROGRADE/URETEROSCOPY/HOLMIUM LASER/BASKET RETRIEVAL/STENT PLACEMENT;  Surgeon: Ceasar Mons, MD;  Location: [facility];  Service: Urology;  Laterality: Left;  . EYE SURGERY Bilateral    cateracts  . falls     variious fall, broken wrist,and toes  . FRACTURE SURGERY Right April 2016   Wrist, Pt. fell  . IR NEPHROSTOMY PLACEMENT RIGHT  04/19/2020  . kidney stone removal Left 01/20/2019  . RIGHT/LEFT HEART CATH AND CORONARY ANGIOGRAPHY N/A 02/05/2018   Procedure: RIGHT/LEFT HEART CATH AND CORONARY ANGIOGRAPHY;  Surgeon: Troy Sine, MD;  Location: [facility];  Service: Cardiovascular;  Laterality: N/A;  . SPINAL CORD STIMULATOR INSERTION N/A 09/09/2015   Procedure: LUMBAR SPINAL CORD STIMULATOR INSERTION;  Surgeon: Clydell Hakim, MD;  Location: [facility];  Service: Neurosurgery;  Laterality:  N/A;  LUMBAR SPINAL CORD STIMULATOR INSERTION  . SPINE SURGERY  April 2013   Back X's 4  . TOTAL HIP ARTHROPLASTY     right  . WRIST FRACTURE SURGERY Bilateral     Allergies:  Allergies  Allergen Reactions  . Adrenalone   . Lactose Other (See Comments)    abd pain, lactose intolerant  . Morphine Other (See Comments)  . Sulfa Antibiotics Other (See Comments)    Headache, very sick  . Codeine Other (See Comments)    Reaction:  Headaches and nightmares   . Lactose Intolerance (Gi) Nausea And Vomiting  . Latex Rash  . Lyrica [Pregabalin] Swelling and Other (See Comments)    Reaction:  Leg swelling  . Other Other (See Comments)    Pt states that pain medications give her  nightmares.    . Plaquenil [Hydroxychloroquine Sulfate] Other (See Comments)    Reaction:  GI upset   . Reglan [Metoclopramide] Other (See Comments)    Reaction:  GI upset   . Requip [Ropinirole Hcl] Other (See Comments)    Reaction:  GI upset   . Septra [Sulfamethoxazole-Trimethoprim] Nausea And Vomiting  . Shellfish Allergy Nausea And Vomiting    Family History  Problem Relation Age of Onset  . Heart attack Mother   . Restless legs syndrome Mother   . Heart failure Mother   . Heart disease Mother   . Hypertension Mother   . COPD Father     Social History:  reports that she has never smoked. She has never used smokeless tobacco. She reports that she does not drink alcohol and does not use drugs.  ROS: A complete review of systems was performed.  All systems are negative except for pertinent findings as noted.  Physical Exam:  Vital signs in last 24 hours: Temp:  [97.5 F (36.4 C)] 97.5 F (36.4 C) (06/15 1002) Pulse Rate:  [76] 76 (06/15 1002) Resp:  [16] 16 (06/15 1002) BP: (101)/(59) 101/59 (06/15 1002) SpO2:  [98 %] 98 % (06/15 1002) Weight:  [64.4 kg] 64.4 kg (06/15 1055) Constitutional:  Alert and oriented, No acute distress Cardiovascular: Regular rate and rhythm, No JVD Respiratory: Normal respiratory effort, Lungs clear bilaterally GI: Abdomen is soft, nontender, nondistended, no abdominal masses GU: No CVA tenderness Lymphatic: No lymphadenopathy Neurologic: Grossly intact, no focal deficits Psychiatric: Normal mood and affect  Laboratory Data:  Recent Labs    05/24/20 1015  WBC 7.8  HGB 9.8*  HCT 30.9*  PLT 286    Recent Labs    05/24/20 1015  NA 141  K 3.5  CL 107  GLUCOSE 100*  BUN 12  CALCIUM 8.9  CREATININE 0.81     Results for orders placed or performed during the hospital encounter of 05/24/20 (from the past 24 hour(s))  SARS Coronavirus 2 by RT PCR (hospital order, performed in [facility] hospital lab) Nasopharyngeal  Nasopharyngeal Swab     Status: None   Collection Time: 05/24/20  9:58 AM   Specimen: Nasopharyngeal Swab  Result Value Ref Range   SARS Coronavirus 2 NEGATIVE NEGATIVE  Basic metabolic panel     Status: Abnormal   Collection Time: 05/24/20 10:15 AM  Result Value Ref Range   Sodium 141 135 - 145 mmol/L   Potassium 3.5 3.5 - 5.1 mmol/L   Chloride 107 98 - 111 mmol/L   CO2 24 22 - 32 mmol/L   Glucose, Bld 100 (H) 70 - 99 mg/dL   BUN 12  8 - 23 mg/dL   Creatinine, Ser 0.81 0.44 - 1.00 mg/dL   Calcium 8.9 8.9 - 10.3 mg/dL   GFR calc non Af Amer >60 >60 mL/min   GFR calc Af Amer >60 >60 mL/min   Anion gap 10 5 - 15  CBC     Status: Abnormal   Collection Time: 05/24/20 10:15 AM  Result Value Ref Range   WBC 7.8 4.0 - 10.5 K/uL   RBC 3.14 (L) 3.87 - 5.11 MIL/uL   Hemoglobin 9.8 (L) 12.0 - 15.0 g/dL   HCT 30.9 (L) 36 - 46 %   MCV 98.4 80.0 - 100.0 fL   MCH 31.2 26.0 - 34.0 pg   MCHC 31.7 30.0 - 36.0 g/dL   RDW 13.8 11.5 - 15.5 %   Platelets 286 150 - 400 K/uL   nRBC 0.0 0.0 - 0.2 %   Recent Results (from the past 240 hour(s))  SARS Coronavirus 2 by RT PCR (hospital order, performed in [facility] hospital lab) Nasopharyngeal Nasopharyngeal Swab     Status: None   Collection Time: 05/24/20  9:58 AM   Specimen: Nasopharyngeal Swab  Result Value Ref Range Status   SARS Coronavirus 2 NEGATIVE NEGATIVE Final    Comment: (NOTE) SARS-CoV-2 target nucleic acids are NOT DETECTED.  The SARS-CoV-2 RNA is generally detectable in upper and lower respiratory specimens during the acute phase of infection. The lowest concentration of SARS-CoV-2 viral copies this assay can detect is 250 copies / mL. A negative result does not preclude SARS-CoV-2 infection and should not be used as the sole basis for treatment or other patient management decisions.  A negative result may occur with improper specimen collection / handling, submission of specimen other than nasopharyngeal swab, presence of viral  mutation(s) within the areas targeted by this assay, and inadequate number of viral copies (<250 copies / mL). A negative result must be combined with clinical observations, patient history, and epidemiological information.  Fact Sheet for Patients:   StrictlyIdeas.no  Fact Sheet for Healthcare Providers: BankingDealers.co.za  This test is not yet approved or  cleared by the [place] FDA and has been authorized for detection and/or diagnosis of SARS-CoV-2 by FDA under an Emergency Use Authorization (EUA).  This EUA will remain in effect (meaning this test can be used) for the duration of the COVID-19 declaration under Section 564(b)(1) of the Act, 21 U.S.C. section 360bbb-3(b)(1), unless the authorization is terminated or revoked sooner.  Performed at [facility], [employer] [address] [place], [place] [address]     Renal Function: Recent Labs    05/24/20 1015  CREATININE 0.81   Estimated Creatinine Clearance: 43.9 mL/min (by C-G formula based on SCr of 0.81 mg/dL).  Radiologic Imaging: CLINICAL DATA:  Severe sepsis.  Rule out hydronephrosis  EXAM: CT ABDOMEN AND PELVIS WITHOUT CONTRAST  TECHNIQUE: Multidetector CT imaging of the abdomen and pelvis was performed following the standard protocol without IV contrast.  COMPARISON:  01/02/2019  FINDINGS: Lower chest: Trace pleural effusions and lower lobe atelectasis. Extensive coronary atherosclerosis. Small hiatal hernia.  Hepatobiliary: No focal liver abnormality.No evidence of biliary obstruction or stone.  Pancreas: Mild generalized atrophy.  Spleen: Unremarkable.  Adrenals/Urinary Tract: Negative adrenals. Right hydroureteronephrosis due to a obstructing 11 x 7 mm stone at the pelvic inlet.  Irregular 7 mm interpolar calculus in the left kidney. Two small lower pole calculi on the right.  Unremarkable bladder.  Stomach/Bowel:   No obstruction. Moderate rectal stool.  Vascular/Lymphatic: Atherosclerosis no acute vascular abnormality. No mass or adenopathy.  Reproductive:Hysterectomy  Other: No ascites or pneumoperitoneum.  Musculoskeletal: Lumbar fusion from L2-S1. Severe adjacent segment degeneration at T12-L1 and especially L1-2 where there is retrolisthesis and spinal/foraminal impingement. A spinal stimulator is present.  IMPRESSION: 1. Obstructing 11 x 7 mm stone in the right ureter at the level of the pelvic inlet. 2. Bilateral nephrolithiasis. 3. Incidental findings are noted above.   Electronically Signed   By: Monte Fantasia M.D.   On: 04/19/2020 06:29  Assessment and Plan GICELA SCHWARTING is a 86 y.o. female with an obstructing 11 mm right ureteral calculus along with multiple bilateral nonobstructing renal stones.  She is status post right PCN placement on 04/18/2020.  She is here today for definitive stone treatment.  The risks, benefits and alternatives of cystoscopy with BILATERAL ureteroscopy, laser lithotripsy and ureteral stent placement was discussed the patient.  Risks included, but are not limited to: bleeding, urinary tract infection, ureteral injury/avulsion, ureteral stricture formation, retained stone fragments, the possibility that multiple surgeries may be required to treat the stone(s), MI, stroke, PE and the inherent risks of general anesthesia.  The patient voices understanding and wishes to proceed.     Ellison Hughs, MD 05/24/2020, 11:43 AM  [facility] Pager: [phone]

## 2020-05-24 NOTE — Anesthesia Postprocedure Evaluation (Signed)
Anesthesia Post Note  Patient: [name]  Procedure(s) Performed: CYSTOSCOPY/RETROGRADE/URETEROSCOPY/HOLMIUM LASER/STENT PLACEMENT (Bilateral )     Patient location during evaluation: PACU Anesthesia Type: General Level of consciousness: awake and alert and oriented Pain management: pain level controlled Vital Signs Assessment: post-procedure vital signs reviewed and stable Respiratory status: spontaneous breathing, nonlabored ventilation and respiratory function stable Cardiovascular status: blood pressure returned to baseline Postop Assessment: no apparent nausea or vomiting Anesthetic complications: no   No complications documented.                Brennan Bailey

## 2020-05-24 NOTE — Anesthesia Procedure Notes (Addendum)
Procedure Name: LMA Insertion Date/Time: 05/24/2020 12:43 PM Performed by: Silas [place], CRNA Pre-anesthesia Checklist: Patient identified, Emergency Drugs available, Suction available and Patient being monitored Patient Re-evaluated:Patient Re-evaluated prior to induction Oxygen Delivery Method: Circle system utilized Preoxygenation: Pre-oxygenation with 100% oxygen Induction Type: IV induction Ventilation: Mask ventilation without difficulty LMA: LMA inserted LMA Size: 3.0 Tube type: Oral Number of attempts: 2 Placement Confirmation: positive ETCO2 and breath sounds checked- equal and bilateral Tube secured with: Tape Dental Injury: Teeth and Oropharynx as per pre-operative assessment

## 2020-05-25 ENCOUNTER — Encounter (HOSPITAL_COMMUNITY): Payer: Self-pay | Admitting: Urology

## 2020-06-01 ENCOUNTER — Ambulatory Visit: Payer: Medicare Other | Admitting: Physician Assistant

## 2020-06-01 ENCOUNTER — Encounter: Payer: Self-pay | Admitting: Nurse Practitioner

## 2020-06-01 ENCOUNTER — Non-Acute Institutional Stay (SKILLED_NURSING_FACILITY): Payer: Medicare Other | Admitting: Nurse Practitioner

## 2020-06-01 DIAGNOSIS — D500 Iron deficiency anemia secondary to blood loss (chronic): Secondary | ICD-10-CM

## 2020-06-01 DIAGNOSIS — I5032 Chronic diastolic (congestive) heart failure: Secondary | ICD-10-CM

## 2020-06-01 DIAGNOSIS — N201 Calculus of ureter: Secondary | ICD-10-CM

## 2020-06-01 DIAGNOSIS — D72829 Elevated white blood cell count, unspecified: Secondary | ICD-10-CM | POA: Diagnosis not present

## 2020-06-01 DIAGNOSIS — G20 Parkinson's disease: Secondary | ICD-10-CM

## 2020-06-01 DIAGNOSIS — F323 Major depressive disorder, single episode, severe with psychotic features: Secondary | ICD-10-CM | POA: Diagnosis not present

## 2020-06-01 DIAGNOSIS — M154 Erosive (osteo)arthritis: Secondary | ICD-10-CM | POA: Diagnosis not present

## 2020-06-01 DIAGNOSIS — E02 Subclinical iodine-deficiency hypothyroidism: Secondary | ICD-10-CM

## 2020-06-01 DIAGNOSIS — K5909 Other constipation: Secondary | ICD-10-CM

## 2020-06-01 NOTE — Assessment & Plan Note (Signed)
Resolved

## 2020-06-01 NOTE — Assessment & Plan Note (Signed)
TSH 1.62 09/08/19, continue Levothyroxine 63mcg qd.

## 2020-06-01 NOTE — Progress Notes (Signed)
Location:   [facility] Room Number: 45 Place of Service:  SNF ([phone]) Provider:  Marlana Latus, NP  [name], MD  Patient Care Team: [name], MD as PCP - General (Internal Medicine) [name], MD as PCP - Cardiology (Cardiology) [name], MD (Neurosurgery) [name], MD (Neurology) [name], MD (Inactive) as Consulting Physician (Gastroenterology) [name], MD as Consulting Physician (Urology) Zalmen Wrightsman X, NP as Nurse Practitioner (Internal Medicine)  Extended Emergency Contact Information Primary Emergency Contact: [facility] Address: [address]          [place], [place] [address] [name] of [place] Phone: [phone] Mobile Phone: [phone] Relation: Spouse Secondary Emergency Contact: [name] Mobile Phone: [phone] Relation: Daughter Preferred language: [name] Interpreter needed? No  Code Status:  Full Code Goals of care: Advanced Directive information Advanced Directives 06/06/2020  Does Patient Have a Medical Advance Directive? Yes  Type of Advance Directive Living will  Does patient want to make changes to medical advance directive? No - Patient declined  Copy of [place] in Chart? -  Would patient like information on creating a medical advance directive? -  Pre-existing out of facility DNR order (yellow form or pink MOST form) -     Chief Complaint  Patient presents with  . Medical Management of Chronic Issues    Routine visit    HPI:  Pt is a 86 y.o. female seen today for medical management of chronic diseases.  Anemia, Hgb 9.8 05/24/20, takes Fe qd.   Erosive arthritis, takes Tylenol 500mg  bid, Hydroxychloroquine 200mg  qd  Parkinson's disease, takes Sinemet 25/100mg  II bid, Mirapex 1mg  bid.   Constipation, takes Colace 100mg  qd, Senokot S I qd, Amitiza 22mcg qd.   CHF/edema BLE, takes Furosemide 20mg  qd  Hypothyroidism, takes  Levothyroxine 89mcg qd, TSH 1.62 09/08/19  Depression, psychosis, takes Seroquel 12.5mg  qam, 25mg  qd, Sertraline 75mg  qd.    Past Medical History:  Diagnosis Date  . Abnormal nuclear stress test   . Actinic cheilitis 04/23/2019  . Anemia    iron deficiency   . Anxiety   . Aortic stenosis    severe by echo 2021   . Arthritis   . Broken arm    right   . Chronic diastolic CHF (congestive heart failure) ([place])   . Chronic pain   . Cognitive communication deficit   . Constipation   . Depression   . Erosive (osteo)arthritis 03/09/2020  . Family history of adverse reaction to anesthesia    pt. states sister vomits  . Fibromyalgia   . GERD (gastroesophageal reflux disease)   . H/O hiatal hernia   . Hallucinations, visual 07/21/2018   03/10/20 psych consult.   . Hematuria 05/18/2020  . History of bronchitis   . History of kidney stones   . Hypercholesterolemia   . Hypertension    dr t turner  . Hypothyroidism   . Memory loss 04/21/2018   08/08/18 CT head no traumatic findings, atrophy.  09/27/19 MMSE 25/30, failed the clock drawing  . Microhematuria 03/24/2015  . Neuropathy   . Osteoporosis   . Parkinson disease ([place])   . PVD (peripheral vascular disease) ([facility])    99% stenosis of left anteiror tibial artery, mod stenosis of left distal SFA and popliteal artery followed by [name]  . RBBB    noted on EKG 2018  . Restless leg syndrome   . Sepsis ([place])    hx of  due to [facility]  . Septic shock ([place]) 04/19/2020  . Shortness of breath    with exertion - chronic   . Sjogren's disease ([place])   . Sjogren's syndrome [facility]) 02/04/2018   04/16/19 rheumatology: Erosive OA of hands, Sjogrens syndrome, low back pain at multiple sites, recurrent kidney stones, age related osteoporosis w/o current pathological fracture, f/u 6 months.   . SOB (shortness of breath)    chronic due to diastolic dysfunction, deconditioning, obesity  . Spinal stenosis    lumbar region   . Tremor   . Unstable gait 04/21/2018   . Unsteadiness on feet   . Urinary frequency 09/18/2018  . Urinary tract infection   . Visual hallucinations    Past Surgical History:  Procedure Laterality Date  . ABDOMINAL HYSTERECTOMY    . BACK SURGERY     4 back surgeries,   lumbar fusion  . CARDIAC CATHETERIZATION  2006   normal  . CYSTOSCOPY/URETEROSCOPY/HOLMIUM LASER/STENT PLACEMENT Left 01/06/2019   Procedure: CYSTOSCOPY/RETROGRADE/URETEROSCOPY/HOLMIUM LASER/BASKET RETRIEVAL/STENT PLACEMENT;  Surgeon: Ceasar Mons, MD;  Location: [facility];  Service: Urology;  Laterality: Left;  . CYSTOSCOPY/URETEROSCOPY/HOLMIUM LASER/STENT PLACEMENT Bilateral 05/24/2020   Procedure: CYSTOSCOPY/RETROGRADE/URETEROSCOPY/HOLMIUM LASER/STENT PLACEMENT;  Surgeon: Ceasar Mons, MD;  Location: [facility];  Service: Urology;  Laterality: Bilateral;  . EYE SURGERY Bilateral    cateracts  . falls     variious fall, broken wrist,and toes  . FRACTURE SURGERY Right April 2016   Wrist, Pt. fell  . IR NEPHROSTOMY PLACEMENT RIGHT  04/19/2020  . kidney stone removal Left 01/20/2019  . RIGHT/LEFT HEART CATH AND CORONARY ANGIOGRAPHY N/A 02/05/2018   Procedure: RIGHT/LEFT HEART CATH AND CORONARY ANGIOGRAPHY;  Surgeon: Troy Sine, MD;  Location: [place] CV LAB;  Service: Cardiovascular;  Laterality: N/A;  . SPINAL CORD STIMULATOR INSERTION N/A 09/09/2015   Procedure: LUMBAR SPINAL CORD STIMULATOR INSERTION;  Surgeon: Clydell Hakim, MD;  Location: [place] NEURO ORS;  Service: Neurosurgery;  Laterality: N/A;  LUMBAR SPINAL CORD STIMULATOR INSERTION  . SPINE SURGERY  April 2013   Back X's 4  . TOTAL HIP ARTHROPLASTY     right  . WRIST FRACTURE SURGERY Bilateral     Allergies  Allergen Reactions  . Adrenalone   . Lactose Other (See Comments)    abd pain, lactose intolerant  . Morphine Other (See Comments)  . Sulfa Antibiotics Other (See Comments)    Headache, very sick  . Codeine Other (See Comments)    Reaction:  Headaches and  nightmares   . Lactose Intolerance (Gi) Nausea And Vomiting  . Latex Rash  . Lyrica [Pregabalin] Swelling and Other (See Comments)    Reaction:  Leg swelling  . Other Other (See Comments)    Pt states that pain medications give her nightmares.    . Plaquenil [Hydroxychloroquine Sulfate] Other (See Comments)    Reaction:  GI upset   . Reglan [Metoclopramide] Other (See Comments)    Reaction:  GI upset   . Requip [Ropinirole Hcl] Other (See Comments)    Reaction:  GI upset   . Septra [Sulfamethoxazole-Trimethoprim] Nausea And Vomiting  . Shellfish Allergy Nausea And Vomiting    Allergies as of 06/01/2020      Reactions   Adrenalone    Lactose Other (See Comments)   abd pain, lactose intolerant   Morphine Other (See Comments)   Sulfa Antibiotics Other (See Comments)   Headache, very sick   Codeine Other (See Comments)   Reaction:  Headaches and nightmares  Lactose Intolerance (gi) Nausea And Vomiting   Latex Rash   Lyrica [pregabalin] Swelling, Other (See Comments)   Reaction:  Leg swelling   Other Other (See Comments)   Pt states that pain medications give her nightmares.     Plaquenil [hydroxychloroquine Sulfate] Other (See Comments)   Reaction:  GI upset    Reglan [metoclopramide] Other (See Comments)   Reaction:  GI upset    Requip [ropinirole Hcl] Other (See Comments)   Reaction:  GI upset    Septra [sulfamethoxazole-trimethoprim] Nausea And Vomiting   Shellfish Allergy Nausea And Vomiting      Medication List       Accurate as of June 01, 2020 11:59 PM. If you have any questions, ask your nurse or doctor.        STOP taking these medications   FLORASTOR PO Stopped by: Sammie Denner X Shalik Sanfilippo, NP   HYDROcodone-acetaminophen 5-325 MG tablet Commonly known as: Norco Stopped by: Michalla Ringer X Corrina Steffensen, NP     TAKE these medications   acetaminophen 500 MG tablet Commonly known as: TYLENOL Take 500 mg by mouth daily as needed for mild pain. not to exceed 3 gm in 24 hours. As needed  for arthritis pain   TYLENOL 500 MG tablet Generic drug: acetaminophen Take 500 mg by mouth 2 (two) times daily.   AeroChamber MV inhaler by Other route. Use as instructed as needed   albuterol 108 (90 Base) MCG/ACT inhaler Commonly known as: VENTOLIN HFA Inhale 2 puffs into the lungs every 6 (six) hours as needed for wheezing or shortness of breath.   aspirin EC 81 MG tablet Take 81 mg by mouth daily.   Calcium 600/Vitamin D3 600-800 MG-UNIT Tabs Generic drug: Calcium Carb-Cholecalciferol Take 1 tablet by mouth daily.   carbidopa-levodopa 25-100 MG tablet Commonly known as: SINEMET IR Take 2 tablets by mouth in the morning and at bedtime.   Cholecalciferol 50 MCG (2000 UT) Tabs Take 1 tablet by mouth daily.   clotrimazole-betamethasone cream Commonly known as: Lotrisone Apply 1 application topically 2 (two) times daily.   docusate sodium 100 MG capsule Commonly known as: COLACE Take 100 mg by mouth at bedtime.   eucerin cream Apply 1 application topically daily.   ezetimibe 10 MG tablet Commonly known as: ZETIA TAKE 1 TABLET ONCE DAILY.   furosemide 20 MG tablet Commonly known as: LASIX Take 20 mg by mouth daily.   hydrocortisone 2.5 % cream Apply 1 application topically 2 (two) times daily.   hydroxychloroquine 200 MG tablet Commonly known as: PLAQUENIL Take 200 mg by mouth daily.   lansoprazole 30 MG disintegrating tablet Commonly known as: PREVACID SOLUTAB Take 30 mg by mouth daily at 12 noon.   levothyroxine 50 MCG tablet Commonly known as: SYNTHROID Take 50 mcg by mouth daily before breakfast.   lubiprostone 24 MCG capsule Commonly known as: AMITIZA Take 24 mcg by mouth 2 (two) times daily with a meal.   multivitamin with minerals tablet Take 1 tablet by mouth daily.   PRESERVISION AREDS 2 PO Take 1 tablet by mouth daily.   nystatin powder Commonly known as: MYCOSTATIN/NYSTOP Apply topically 2 (two) times daily as needed.   potassium  chloride 10 MEQ tablet Commonly known as: KLOR-CON Take 10 mEq by mouth daily.   pramipexole 1 MG tablet Commonly known as: MIRAPEX Take 1 mg by mouth 2 (two) times daily.   QUEtiapine 25 MG tablet Commonly known as: SEROQUEL Take 12.5 mg by mouth every morning. 1/2 tablet  to = 12.5 mg QAM   QUEtiapine 25 MG tablet Commonly known as: SEROQUEL Take 25 mg by mouth daily.   rosuvastatin 20 MG tablet Commonly known as: CRESTOR Take 20 mg by mouth daily. What changed: Another medication with the same name was removed. Continue taking this medication, and follow the directions you see here. Changed by: Alexandros Ewan X Offie Waide, NP   senna-docusate 8.6-50 MG tablet Commonly known as: Senokot-S Take 1 tablet by mouth at bedtime.   sertraline 50 MG tablet Commonly known as: ZOLOFT Take 75 mg by mouth at bedtime. 1-1/2 tablets to = 75 mg   Slow Fe 142 (45 Fe) MG Tbcr Generic drug: Ferrous Sulfate Take 1 tablet by mouth. Daily   Systane 0.4-0.3 % Soln Generic drug: Polyethyl Glycol-Propyl Glycol Apply 1 drop to eye 2 (two) times daily as needed (dry eyes).       Review of Systems  Constitutional: Negative for fatigue, fever and unexpected weight change.  HENT: Positive for hearing loss. Negative for congestion and voice change.   Eyes: Negative for visual disturbance.  Respiratory: Negative for cough and shortness of breath.   Cardiovascular: Positive for leg swelling.  Gastrointestinal: Negative for abdominal pain and constipation.  Genitourinary: Negative for dysuria, flank pain and hematuria.  Musculoskeletal: Positive for arthralgias, back pain, gait problem and myalgias.       Chronic lower back pain.   Skin: Negative for color change.       S/p percutaneous nephrostomy of the right   Neurological: Negative for dizziness, speech difficulty, weakness and headaches.       Memory lapses.   Psychiatric/Behavioral: Negative for behavioral problems, hallucinations and sleep disturbance.  The patient is not nervous/anxious.     Immunization History  Administered Date(s) Administered  . Influenza Split 09/09/2013  . Influenza, High Dose Seasonal PF 09/12/2018, 09/15/2019  . Influenza,inj,Quad PF,6+ Mos 09/11/2016  . Moderna SARS-COVID-2 Vaccination 12/12/2019, 01/09/2020  . Pneumococcal Conjugate-13 04/15/2014  . Pneumococcal Polysaccharide-23 09/28/2005  . Tdap 09/01/2015  . Zoster Recombinat (Shingrix) 06/11/2018, 09/10/2018   Pertinent  Health Maintenance Due  Topic Date Due  . INFLUENZA VACCINE  07/10/2020  . DEXA SCAN  Completed  . PNA vac Low Risk Adult  Completed   Fall Risk  11/16/2019 04/23/2019 04/09/2019 10/29/2018 10/27/2018  Falls in the past year? 0 0 0 0 1  Comment - - - fell June 2019 -  Number falls in past yr: - 0 0 - 0  Injury with Fall? - 0 0 - 1  Comment - - - - -  Risk for fall due to : - - - - -  Risk for fall due to: Comment - - - - -   Functional Status Survey:    Vitals:   06/01/20 1348  BP: 112/60  Pulse: 84  Resp: 15  Temp: (!) 97.3 F (36.3 C)  SpO2: 93%  Weight: 149 lb 6.4 oz (67.8 kg)  Height: 5\' 2"  (1.575 m)   Body mass index is 27.33 kg/m. Physical Exam Vitals and nursing note reviewed.  Constitutional:      Appearance: Normal appearance.  HENT:     Head: Normocephalic and atraumatic.     Mouth/Throat:     Mouth: Mucous membranes are moist.  Eyes:     Extraocular Movements: Extraocular movements intact.     Conjunctiva/sclera: Conjunctivae normal.     Pupils: Pupils are equal, round, and reactive to light.  Cardiovascular:     Rate and Rhythm:  Normal rate and regular rhythm.     Heart sounds: Murmur heard.   Pulmonary:     Breath sounds: No rhonchi or rales.  Abdominal:     General: Bowel sounds are normal.     Palpations: Abdomen is soft.     Tenderness: There is no abdominal tenderness. There is no right CVA tenderness or left CVA tenderness.  Musculoskeletal:        General: No swelling or tenderness.       Cervical back: Normal range of motion and neck supple.     Right lower leg: Edema present.     Left lower leg: Edema present.     Comments: Trace edema BLE.  Arthritic changes in fingers, R>L  Skin:    General: Skin is warm and dry.     Comments: S/p the right percutaneous nephrostomy.   Neurological:     General: No focal deficit present.     Mental Status: She is alert. Mental status is at baseline.     Gait: Gait abnormal.     Comments: Oriented to person, place.   Psychiatric:        Mood and Affect: Mood normal.        Behavior: Behavior normal.     Labs reviewed: Recent Labs    04/21/20 0325 04/22/20 0605 04/22/20 0903 04/23/20 0350 04/23/20 0350 04/24/20 0423 04/25/20 0427 05/24/20 1015  NA 143  --    < > 144   < > 145 146* 141  K 3.4*  --    < > 3.1*   < > 3.0* 3.8 3.5  CL 115*  --    < > 119*   < > 115* 115* 107  CO2 20*  --    < > 21*   < > 22 26 24   GLUCOSE 129*  --    < > 80   < > 85 88 100*  BUN 35*  --    < > 33*   < > 26* 16 12  CREATININE 1.23*  --    < > 1.01*   < > 0.78 0.81 0.81  CALCIUM 7.2*  --    < > 7.2*   < > 7.0* 7.6* 8.9  MG 3.0* 2.6*  --  2.5*  --   --   --   --   PHOS 3.2 2.6  --  2.3*  --   --   --   --    < > = values in this interval not displayed.   Recent Labs    04/20/20 0455 04/21/20 0325 04/24/20 0423  AST 117* 50* 32  ALT 18 8 7   ALKPHOS 95 95 77  BILITOT 0.5 0.5 0.6  PROT 5.1* 5.4* 4.7*  ALBUMIN 2.3* 2.3* 2.3*   Recent Labs    02/12/20 0000 03/31/20 0000 04/19/20 0200 04/19/20 1442 04/24/20 0423 04/24/20 0423 04/25/20 0427 04/26/20 0000 05/24/20 1015  WBC 7.5   < > 1.0*   < > 14.5*   < > 13.8* 10.7 7.8  NEUTROABS 4,845  --  0.7*  --   --   --   --  7,447  --   HGB 11.0*   < > 9.1*   < > 8.8*   < > 9.4* 9.2* 9.8*  HCT 33*   < > 29.5*   < > 27.1*   < > 29.7* 28* 30.9*  MCV  --   --  101.4*   < >  97.5  --  96.7  --  98.4  PLT  --    < > 131*   < > 194   < > 226 245 286   < > = values in this interval not  displayed.   Lab Results  Component Value Date   TSH 1.62 09/08/2019   No results found for: HGBA1C Lab Results  Component Value Date   CHOL 118 07/14/2019   HDL 50 07/14/2019   LDLCALC 32 07/14/2019   TRIG 182 (H) 07/14/2019   CHOLHDL 2.4 07/14/2019    Significant Diagnostic Results in last 30 days:  DG C-Arm 1-60 Min-No Report  Result Date: 05/24/2020 Fluoroscopy was utilized by the requesting physician.  No radiographic interpretation.    Assessment/Plan Leukocytosis Resolved.   Iron deficiency anemia Hgb 9.8 05/24/20, continue Fe  Depression, psychotic ([place]) Her mood is stable, continue Sertraline 75mg  qd, Quetiapine 12.5mg  qd, 25mg  qd.   Erosive (osteo)arthritis Stable, continue Tylenol 500mg  bid, Hydroxychloroquine 200mg  qd  Parkinson's disease Difficulty with walking, able to feed self, continue Sinemet 25/100mg  II bid, Mirapex 1mg  bid.   Hypothyroidism TSH 1.62 09/08/19, continue Levothyroxine 94mcg qd.   Chronic constipation Stable, continue Colace 100mg  qd, Senokot S I qd, Amitiza 35mcg qd.   Chronic diastolic CHF (congestive heart failure) ([facility]) Chronic edema BLE, continue Furosemide 20mg  qd.   Obstruction of right ureteropelvic junction due to stone S/p percutaneous nephrostomy R f/u Urology.      Family/ staff Communication: plan of care reviewed with the patient and charge nurse.   Labs/tests ordered: none  Time spend 25 minutes.

## 2020-06-01 NOTE — Assessment & Plan Note (Signed)
Difficulty with walking, able to feed self, continue Sinemet 25/100mg  II bid, Mirapex 1mg  bid.

## 2020-06-01 NOTE — Assessment & Plan Note (Signed)
Her mood is stable, continue Sertraline 75mg  qd, Quetiapine 12.5mg  qd, 25mg  qd.

## 2020-06-01 NOTE — Assessment & Plan Note (Signed)
S/p percutaneous nephrostomy R f/u Urology.

## 2020-06-01 NOTE — Assessment & Plan Note (Signed)
Stable, continue Tylenol 500mg  bid, Hydroxychloroquine 200mg  qd

## 2020-06-01 NOTE — Assessment & Plan Note (Signed)
Hgb 9.8 05/24/20, continue Fe

## 2020-06-01 NOTE — Assessment & Plan Note (Signed)
Stable, continue Colace 100mg  qd, Senokot S I qd, Amitiza 33mcg qd.

## 2020-06-01 NOTE — Assessment & Plan Note (Signed)
Chronic edema BLE, continue Furosemide 20mg qd.  

## 2020-06-02 ENCOUNTER — Encounter: Payer: Self-pay | Admitting: Nurse Practitioner

## 2020-06-06 ENCOUNTER — Non-Acute Institutional Stay (SKILLED_NURSING_FACILITY): Payer: Medicare Other | Admitting: Nurse Practitioner

## 2020-06-06 ENCOUNTER — Encounter: Payer: Self-pay | Admitting: Nurse Practitioner

## 2020-06-06 DIAGNOSIS — F323 Major depressive disorder, single episode, severe with psychotic features: Secondary | ICD-10-CM

## 2020-06-06 DIAGNOSIS — M154 Erosive (osteo)arthritis: Secondary | ICD-10-CM

## 2020-06-06 DIAGNOSIS — K5909 Other constipation: Secondary | ICD-10-CM

## 2020-06-06 DIAGNOSIS — I5032 Chronic diastolic (congestive) heart failure: Secondary | ICD-10-CM

## 2020-06-06 DIAGNOSIS — D500 Iron deficiency anemia secondary to blood loss (chronic): Secondary | ICD-10-CM

## 2020-06-06 DIAGNOSIS — E02 Subclinical iodine-deficiency hypothyroidism: Secondary | ICD-10-CM

## 2020-06-06 DIAGNOSIS — G20 Parkinson's disease: Secondary | ICD-10-CM

## 2020-06-06 DIAGNOSIS — K219 Gastro-esophageal reflux disease without esophagitis: Secondary | ICD-10-CM

## 2020-06-06 NOTE — Assessment & Plan Note (Signed)
Her mood is stable, continue Sertraline 75mg qd, Quetiapine 25mg qd, 12.5mg qd  

## 2020-06-06 NOTE — Assessment & Plan Note (Signed)
Stable, continue  Senokot S I qd, Amitiza 66mcg qd, Colace qd

## 2020-06-06 NOTE — Assessment & Plan Note (Signed)
Continue Fe, Hgb 9.8 05/24/20

## 2020-06-06 NOTE — Assessment & Plan Note (Signed)
Continue Plaquenil, Tylenol.

## 2020-06-06 NOTE — Assessment & Plan Note (Signed)
Nausea after am meds, will add Famotidine 20mg  qd, continue Lansoprazole 30mg  qd.

## 2020-06-06 NOTE — Assessment & Plan Note (Signed)
Stable, continue Levothyroxine 40mcg qd, TSH 1.62 09/08/19

## 2020-06-06 NOTE — Assessment & Plan Note (Signed)
Chronic DOE, trace edema BLE, continue  Furosemide 20mg  qd, cardiology f/u 06/07/20

## 2020-06-06 NOTE — Progress Notes (Signed)
Cardiology Office Note    Date:  06/08/2020   ID:  [name], DOB 24-May-1931, MRN [id number]  PCP:  [name], MD  Cardiologist: Fransico Him, MD EPS: None  No chief complaint on file.   History of Present Illness:  [name] is a 86 y.o. female with hx of chronic diastolic heart failure, severe aortic stenosis, hypertension, deconditioning, morbid obesity and chronic shortness of breath secondary to diastolic dysfunction, normal coronary arteries on cath in 2006, repeat cath 01/2018 mild nonobstructive disease with 30 to 40% proximal LAD, mild to moderate aortic stenosis mean gradient 14 mmHg.  Chronic dyspnea on exertion felt secondary to sedentary lifestyle deconditioning and obesity.    Echo 04/20/2020 severe aortic stenosis mean/peak gradient 66/41 mmHg EF 60 to 65%.  She had septic shock secondary to E. coli UTI and obstructive right kidney 04/2020.  Structural heart said given her recent sepsis TAVR would be prohibitive prior to her urological surgery.  Dr. Radford Pax cleared her to have urology surgery knowing the high risk nature.  Last seen in our office 05/12/2020 with some lower extremity edema. No change in lasix given severe AS.   Patient comes in today accompanied by her husband. She has a ureter stent that will be removed in 2 weeks. Gets short of breath easily with walking. Has difficulty wheeling herself in a wheelchair. Doing physical therapy. [facility] tried to stop lasix because they thought it was causing nausea but restarted it after 3 days as it didn't help and her swelling worsened. Answered a lot of questions surrounding TAVR.   Past Medical History:  Diagnosis Date  . Abnormal nuclear stress test   . Actinic cheilitis 04/23/2019  . Anemia    iron deficiency   . Anxiety   . Aortic stenosis    severe by echo 2021   . Arthritis   . Broken arm    right   . Chronic diastolic CHF (congestive heart failure) ([place])   . Chronic pain   .  Cognitive communication deficit   . Constipation   . Depression   . Erosive (osteo)arthritis 03/09/2020  . Family history of adverse reaction to anesthesia    pt. states sister vomits  . Fibromyalgia   . GERD (gastroesophageal reflux disease)   . H/O hiatal hernia   . Hallucinations, visual 07/21/2018   03/10/20 psych consult.   . Hematuria 05/18/2020  . History of bronchitis   . History of kidney stones   . Hypercholesterolemia   . Hypertension    dr t turner  . Hypothyroidism   . Memory loss 04/21/2018   08/08/18 CT head no traumatic findings, atrophy.  09/27/19 MMSE 25/30, failed the clock drawing  . Microhematuria 03/24/2015  . Neuropathy   . Osteoporosis   . Parkinson disease ([place])   . PVD (peripheral vascular disease) ([facility])    99% stenosis of left anteiror tibial artery, mod stenosis of left distal SFA and popliteal artery followed by [name]  . RBBB    noted on EKG 2018  . Restless leg syndrome   . Sepsis ([place])    hx of due to [facility]  . Septic shock ([place]) 04/19/2020  . Shortness of breath    with exertion - chronic   . Sjogren's disease ([place])   . Sjogren's syndrome [facility]) 02/04/2018   04/16/19 rheumatology: Erosive OA of hands, Sjogrens syndrome, low back pain at multiple sites, recurrent kidney stones, age related osteoporosis w/o current pathological fracture,  f/u 6 months.   . SOB (shortness of breath)    chronic due to diastolic dysfunction, deconditioning, obesity  . Spinal stenosis    lumbar region   . Tremor   . Unstable gait 04/21/2018  . Unsteadiness on feet   . Urinary frequency 09/18/2018  . Urinary tract infection   . Visual hallucinations     Past Surgical History:  Procedure Laterality Date  . ABDOMINAL HYSTERECTOMY    . BACK SURGERY     4 back surgeries,   lumbar fusion  . CARDIAC CATHETERIZATION  2006   normal  . CYSTOSCOPY/URETEROSCOPY/HOLMIUM LASER/STENT PLACEMENT Left 01/06/2019   Procedure: CYSTOSCOPY/RETROGRADE/URETEROSCOPY/HOLMIUM LASER/BASKET  RETRIEVAL/STENT PLACEMENT;  Surgeon: Ceasar Mons, MD;  Location: [facility];  Service: Urology;  Laterality: Left;  . CYSTOSCOPY/URETEROSCOPY/HOLMIUM LASER/STENT PLACEMENT Bilateral 05/24/2020   Procedure: CYSTOSCOPY/RETROGRADE/URETEROSCOPY/HOLMIUM LASER/STENT PLACEMENT;  Surgeon: Ceasar Mons, MD;  Location: [facility];  Service: Urology;  Laterality: Bilateral;  . EYE SURGERY Bilateral    cateracts  . falls     variious fall, broken wrist,and toes  . FRACTURE SURGERY Right April 2016   Wrist, Pt. fell  . IR NEPHROSTOMY PLACEMENT RIGHT  04/19/2020  . kidney stone removal Left 01/20/2019  . RIGHT/LEFT HEART CATH AND CORONARY ANGIOGRAPHY N/A 02/05/2018   Procedure: RIGHT/LEFT HEART CATH AND CORONARY ANGIOGRAPHY;  Surgeon: Troy Sine, MD;  Location: [facility];  Service: Cardiovascular;  Laterality: N/A;  . SPINAL CORD STIMULATOR INSERTION N/A 09/09/2015   Procedure: LUMBAR SPINAL CORD STIMULATOR INSERTION;  Surgeon: Clydell Hakim, MD;  Location: [place] [facility];  Service: Neurosurgery;  Laterality: N/A;  LUMBAR SPINAL CORD STIMULATOR INSERTION  . SPINE SURGERY  April 2013   Back X's 4  . TOTAL HIP ARTHROPLASTY     right  . WRIST FRACTURE SURGERY Bilateral     Current Medications: Current Meds  Medication Sig  . acetaminophen (TYLENOL) 500 MG tablet Take 500 mg by mouth daily as needed for mild pain. not to exceed 3 gm in 24 hours. As needed for arthritis pain  . acetaminophen (TYLENOL) 500 MG tablet Take 500 mg by mouth 2 (two) times daily.  Marland Kitchen albuterol (PROVENTIL HFA;VENTOLIN HFA) 108 (90 Base) MCG/ACT inhaler Inhale 2 puffs into the lungs every 6 (six) hours as needed for wheezing or shortness of breath.  Marland Kitchen aspirin EC 81 MG tablet Take 81 mg by mouth daily.   . Calcium Carb-Cholecalciferol (CALCIUM 600/VITAMIN D3) 600-800 MG-UNIT TABS Take 1 tablet by mouth daily.  . carbidopa-levodopa (SINEMET IR) 25-100 MG tablet Take 2 tablets by mouth in the morning and at  bedtime.   . Cholecalciferol 50 MCG (2000 UT) TABS Take 1 tablet by mouth daily.  . clotrimazole-betamethasone (LOTRISONE) cream Apply 1 application topically 2 (two) times daily.  Marland Kitchen docusate sodium (COLACE) 100 MG capsule Take 100 mg by mouth at bedtime.  Marland Kitchen ezetimibe (ZETIA) 10 MG tablet TAKE 1 TABLET ONCE DAILY.  Marland Kitchen Ferrous Sulfate (SLOW FE) 142 (45 Fe) MG TBCR Take 1 tablet by mouth. Daily  . furosemide (LASIX) 20 MG tablet Take 20 mg by mouth daily.  . hydrocortisone 2.5 % cream Apply 1 application topically 2 (two) times daily.  . hydroxychloroquine (PLAQUENIL) 200 MG tablet Take 200 mg by mouth daily.  . lansoprazole (PREVACID SOLUTAB) 30 MG disintegrating tablet Take 30 mg by mouth daily at 12 noon.  Marland Kitchen levothyroxine (SYNTHROID, LEVOTHROID) 50 MCG tablet Take 50 mcg by mouth daily before breakfast.   . lubiprostone (AMITIZA)  24 MCG capsule Take 24 mcg by mouth 2 (two) times daily with a meal.   . Multiple Vitamins-Minerals (MULTIVITAMIN WITH MINERALS) tablet Take 1 tablet by mouth daily.  . Multiple Vitamins-Minerals (PRESERVISION AREDS 2 PO) Take 1 tablet by mouth daily.  Marland Kitchen nystatin (MYCOSTATIN/NYSTOP) powder Apply topically 2 (two) times daily as needed.  Vladimir Faster Glycol-Propyl Glycol (SYSTANE) 0.4-0.3 % SOLN Apply 1 drop to eye 2 (two) times daily as needed (dry eyes).   . potassium chloride (KLOR-CON) 10 MEQ tablet Take 10 mEq by mouth daily.  . pramipexole (MIRAPEX) 1 MG tablet Take 1 mg by mouth 2 (two) times daily.  . QUEtiapine (SEROQUEL) 25 MG tablet Take 12.5 mg by mouth every morning. 1/2 tablet to = 12.5 mg QAM  . QUEtiapine (SEROQUEL) 25 MG tablet Take 25 mg by mouth daily.  . rosuvastatin (CRESTOR) 20 MG tablet Take 20 mg by mouth daily.  Marland Kitchen senna-docusate (SENOKOT-S) 8.6-50 MG tablet Take 1 tablet by mouth at bedtime.   . sertraline (ZOLOFT) 50 MG tablet Take 75 mg by mouth at bedtime. 1-1/2 tablets to = 75 mg  . Skin Protectants, Misc. (EUCERIN) cream Apply 1 application  topically daily.   Marland Kitchen Spacer/Aero-Holding Chambers (AEROCHAMBER MV) inhaler by Other route. Use as instructed as needed     Allergies:   Adrenalone, Lactose, Morphine, Sulfa antibiotics, Codeine, Lactose intolerance (gi), Latex, Lyrica [pregabalin], Other, Plaquenil [hydroxychloroquine sulfate], Reglan [metoclopramide], Requip [ropinirole hcl], Septra [sulfamethoxazole-trimethoprim], and Shellfish allergy   Social History   Socioeconomic History  . Marital status: Married    Spouse name: [name]  . Number of children: 2  . Years of education: HS  . Highest education level: Not on file  Occupational History    Comment: Homemaker  Tobacco Use  . Smoking status: Never Smoker  . Smokeless tobacco: Never Used  Vaping Use  . Vaping Use: Never used  Substance and Sexual Activity  . Alcohol use: No    Alcohol/week: 0.0 standard drinks  . Drug use: No  . Sexual activity: Never  Other Topics Concern  . Not on file  Social History Narrative   Patient lives at home with her spouse. Married in 1957. Two people lives in the home, no pets.    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    Caffeine Use: tea   Social Determinants of Health   Financial Resource Strain:   . Difficulty of Paying Living Expenses:   Food Insecurity:   . Worried About Charity fundraiser in the Last Year:   . Arboriculturist in the Last Year:   Transportation Needs:   . Film/video editor (Medical):   Marland Kitchen Lack of Transportation (Non-Medical):   Physical Activity:   . Days of Exercise per Week:   . Minutes of Exercise per Session:   Stress:   . Feeling of Stress :   Social Connections:   . Frequency of Communication with Friends and Family:   . Frequency of Social Gatherings with Friends and Family:   . Attends Religious Services:   . Active Member of Clubs or Organizations:   . Attends Archivist Meetings:   Marland Kitchen Marital Status:      Family  History:  The patient's family history includes COPD in her father; Heart attack in her mother; Heart disease in her mother; Heart failure in her mother; Hypertension in her mother; Restless legs syndrome in  her mother.   ROS:   Please see the history of present illness.    ROS All other systems reviewed and are negative.   PHYSICAL EXAM:   VS:  BP (!) 98/50   Pulse 86   Ht 5\' 2"  (1.575 m)   Wt 139 lb 9.6 oz (63.3 kg)   SpO2 99%   BMI 25.53 kg/m   Physical Exam  GEN: Well nourished, well developed, in no acute distress  Neck: no JVD, carotid bruits, or masses Cardiac:RRR; 3/6 to 4/6 systolic murmur at the left sternal border Respiratory:  clear to auscultation bilaterally, normal work of breathing GI: soft, nontender, nondistended, + BS Ext: +2 edema bilaterally  neuro:  Alert and Oriented x 3 Psych: euthymic mood, full affect  Wt Readings from Last 3 Encounters:  06/08/20 139 lb 9.6 oz (63.3 kg)  06/06/20 149 lb 6.4 oz (67.8 kg)  06/01/20 149 lb 6.4 oz (67.8 kg)      Studies/Labs Reviewed:   EKG:  EKG is not ordered today.    Recent Labs: 09/08/2019: TSH 1.62 04/23/2020: Magnesium 2.5 04/24/2020: ALT 7 05/24/2020: BUN 12; Creatinine, Ser 0.81; Hemoglobin 9.8; Platelets 286; Potassium 3.5; Sodium 141   Lipid Panel    Component Value Date/Time   CHOL 118 07/14/2019 0811   TRIG 182 (H) 07/14/2019 0811   HDL 50 07/14/2019 0811   CHOLHDL 2.4 07/14/2019 0811   CHOLHDL 2.7 07/01/2018 0715   LDLCALC 32 07/14/2019 0811   LDLCALC 77 07/01/2018 0715    Additional studies/ records that were reviewed today include:  Echo 04/20/20 1. Since the last study on 12/30/2019 aortic stenosis is now severe -  peak/mean transaortic gradients 66/41 mmHg and dimensionless index 0.21.  Referral for a TAVR procedure should be considered.   2. Left ventricular ejection fraction, by estimation, is 60 to 65%. The  left ventricle has normal function. The left ventricle has no regional  wall  motion abnormalities. There is mild concentric left ventricular  hypertrophy. Left ventricular diastolic  parameters are consistent with Grade I diastolic dysfunction (impaired  relaxation). Elevated left atrial pressure.   3. Right ventricular systolic function is normal. The right ventricular  size is normal.   4. The mitral valve is normal in structure. Mild mitral valve  regurgitation. No evidence of mitral stenosis.   5. The aortic valve is normal in structure. Aortic valve regurgitation is  not visualized. Severe aortic valve stenosis. Aortic valve mean gradient  measures 41.0 mmHg.   6. The inferior vena cava is normal in size with greater than 50%  respiratory variability, suggesting right atrial pressure of 3 mmHg.      RIGHT/LEFT HEART CATH AND CORONARY ANGIOGRAPHY 01/2018  Conclusion      Prox LAD lesion is 35% stenosed.   Mild nonobstructive CAD with 30-40% proximal LAD stenosis before the first diagonal vessel; normal left circumflex and normal RCA.   Upper normal right heart pressures.   Mild-to-moderate aortic stenosis with a mean gradient of 14 mm and aortic valve area of 1.4 centimeters squared.   RECOMMENDATION:  Medical therapy.  The patient will follow-up with Dr. Golden Hurter.              ASSESSMENT:    1. Severe aortic stenosis   2. Lower extremity edema   3. Hyperlipidemia, unspecified hyperlipidemia type      PLAN:  In order of problems listed above:   Severe aortic stenosis - Most recent echo 04/20/20 showed peak/mean  transaortic gradients 66/41 mmHg and dimensionless index 0.21.  -Short of breath with little activity - Plan for TVAR work up after recover from urological procedure. Plan f/u with Dr. Radford Pax after urologic procedures done   Chronic lower extremity edema on low-dose Lasix which was held last week for 3 days because of nausea.  Back on it now not wearing compression hose-will order. Will not increase with low BP and severe  AS.  Hyperlipidemia LDL 32 on Crestor  Medication Adjustments/Labs and Tests Ordered: Current medicines are reviewed at length with the patient today.  Concerns regarding medicines are outlined above.  Medication changes, Labs and Tests ordered today are listed in the Patient Instructions below. Patient Instructions  Medication Instructions:  Your physician recommends that you continue on your current medications as directed. Please refer to the Current Medication list given to you today.  *If you need a refill on your cardiac medications before your next appointment, please call your pharmacy*   Lab Work: None  If you have labs (blood work) drawn today and your tests are completely normal, you will receive your results only by: Marland Kitchen MyChart Message (if you have MyChart) OR . A paper copy in the mail If you have any lab test that is abnormal or we need to change your treatment, we will call you to review the results.   Testing/Procedures: None   Follow-Up: Follow up with Dr. Radford Pax on 08/09/20 at 11:00 AM   Other Instructions None     Signed, Ermalinda Barrios, PA-C  06/08/2020 12:27 PM    [address], [place],   [address] Phone: [phone]; Fax: [phone]

## 2020-06-06 NOTE — Assessment & Plan Note (Signed)
SNF [facility] for care needs, feeds self, continue Sinemet, MiraPex.

## 2020-06-06 NOTE — Progress Notes (Signed)
Location:   SNF [place] Room Number: 45 Place of Service:  SNF (31) Provider: [facility] Mikhail Hallenbeck NP  [name], MD  Patient Care Team: [name], MD as PCP - General (Internal Medicine) [name], MD as PCP - Cardiology (Cardiology) [name], MD (Neurosurgery) [name], MD (Neurology) [name], MD (Inactive) as Consulting Physician (Gastroenterology) [name], MD as Consulting Physician (Urology) Caston Coopersmith X, NP as Nurse Practitioner (Internal Medicine)  Extended Emergency Contact Information Primary Emergency Contact: [facility] Address: [address]          [place], [place] [address] [name] of [place] Phone: [phone] Mobile Phone: [phone] Relation: Spouse Secondary Emergency Contact: [name] Mobile Phone: [phone] Relation: Daughter Preferred language: [name] Interpreter needed? No  Code Status: DNR Goals of care: Advanced Directive information Advanced Directives 06/06/2020  Does Patient Have a Medical Advance Directive? Yes  Type of Advance Directive Living will  Does patient want to make changes to medical advance directive? No - Patient declined  Copy of [place] in Chart? -  Would patient like information on creating a medical advance directive? -  Pre-existing out of facility DNR order (yellow form or pink MOST form) -     Chief Complaint  Patient presents with   Acute Visit    Nausea    HPI:  Pt is a 86 y.o. female seen today for an acute visit for reported nausea 06/05/20, 05/31/20 an hour of am medications, better after ginger ale, the patient denied nausea, vomiting, abd pain, constipation, or diarrhea today  GERD, takes Lansoprazole 30mg  qd.   Parkinson's, takes MiraPex 1mg  bid, Sinemet 25/100 II bid.   Depression, takes Sertraline 75mg  qd, Quetiapine 25mg  qd, 12.5mg  qd  Constipation, takes Senokot S I qd, Amitiza 61mcg qd, Colace  qd  Hypothyroidism, takes Levothyroxine 50mcg qd.   Erosive arthritis, takes Plaquenil 200mg  qd, Tylenol 500mg  bid.   CHF/trace edema BLE, takes Furosemide 20mg  qd, cardiology f/u 06/07/20  Anemia, takes Fe   Past Medical History:  Diagnosis Date   Abnormal nuclear stress test    Actinic cheilitis 04/23/2019   Anemia    iron deficiency    Anxiety    Aortic stenosis    severe by echo 2021    Arthritis    Broken arm    right    Chronic diastolic CHF (congestive heart failure) ([place])    Chronic pain    Cognitive communication deficit    Constipation    Depression    Erosive (osteo)arthritis 03/09/2020   Family history of adverse reaction to anesthesia    pt. states sister vomits   Fibromyalgia    GERD (gastroesophageal reflux disease)    H/O hiatal hernia    Hallucinations, visual 07/21/2018   03/10/20 psych consult.    Hematuria 05/18/2020   History of bronchitis    History of kidney stones    Hypercholesterolemia    Hypertension    dr t turner   Hypothyroidism    Memory loss 04/21/2018   08/08/18 CT head no traumatic findings, atrophy.  09/27/19 MMSE 25/30, failed the clock drawing   Microhematuria 03/24/2015   Neuropathy    Osteoporosis    Parkinson disease ([place])    PVD (peripheral vascular disease) (HCC)    99% stenosis of left anteiror tibial artery, mod stenosis of left distal SFA and popliteal artery followed by [name]   RBBB    noted on EKG  2018   Restless leg syndrome    Sepsis (HCC)    hx of due to Ecoli   Septic shock ([place]) 04/19/2020   Shortness of breath    with exertion - chronic    Sjogren's disease ([place])    Sjogren's syndrome ([place]) 02/04/2018   04/16/19 rheumatology: Erosive OA of hands, Sjogrens syndrome, low back pain at multiple sites, recurrent kidney stones, age related osteoporosis w/o current pathological fracture, f/u 6 months.    SOB (shortness of breath)    chronic due to diastolic dysfunction, deconditioning,  obesity   Spinal stenosis    lumbar region    Tremor    Unstable gait 04/21/2018   Unsteadiness on feet    Urinary frequency 09/18/2018   Urinary tract infection    Visual hallucinations    Past Surgical History:  Procedure Laterality Date   ABDOMINAL HYSTERECTOMY     BACK SURGERY     4 back surgeries,   lumbar fusion   CARDIAC CATHETERIZATION  2006   normal   CYSTOSCOPY/URETEROSCOPY/HOLMIUM LASER/STENT PLACEMENT Left 01/06/2019   Procedure: CYSTOSCOPY/RETROGRADE/URETEROSCOPY/HOLMIUM LASER/BASKET RETRIEVAL/STENT PLACEMENT;  Surgeon: Ceasar Mons, MD;  Location: [facility];  Service: Urology;  Laterality: Left;   CYSTOSCOPY/URETEROSCOPY/HOLMIUM LASER/STENT PLACEMENT Bilateral 05/24/2020   Procedure: CYSTOSCOPY/RETROGRADE/URETEROSCOPY/HOLMIUM LASER/STENT PLACEMENT;  Surgeon: Ceasar Mons, MD;  Location: [facility];  Service: Urology;  Laterality: Bilateral;   EYE SURGERY Bilateral    cateracts   falls     variious fall, broken wrist,and toes   FRACTURE SURGERY Right April 2016   Wrist, Pt. fell   IR NEPHROSTOMY PLACEMENT RIGHT  04/19/2020   kidney stone removal Left 01/20/2019   RIGHT/LEFT HEART CATH AND CORONARY ANGIOGRAPHY N/A 02/05/2018   Procedure: RIGHT/LEFT HEART CATH AND CORONARY ANGIOGRAPHY;  Surgeon: Troy Sine, MD;  Location: [facility];  Service: Cardiovascular;  Laterality: N/A;   SPINAL CORD STIMULATOR INSERTION N/A 09/09/2015   Procedure: LUMBAR SPINAL CORD STIMULATOR INSERTION;  Surgeon: Clydell Hakim, MD;  Location: [facility];  Service: Neurosurgery;  Laterality: N/A;  LUMBAR SPINAL CORD STIMULATOR INSERTION   SPINE SURGERY  April 2013   Back X's 4   TOTAL HIP ARTHROPLASTY     right   WRIST FRACTURE SURGERY Bilateral     Allergies  Allergen Reactions   Adrenalone    Lactose Other (See Comments)    abd pain, lactose intolerant   Morphine Other (See Comments)   Sulfa Antibiotics Other (See Comments)     Headache, very sick   Codeine Other (See Comments)    Reaction:  Headaches and nightmares    Lactose Intolerance (Gi) Nausea And Vomiting   Latex Rash   Lyrica [Pregabalin] Swelling and Other (See Comments)    Reaction:  Leg swelling   Other Other (See Comments)    Pt states that pain medications give her nightmares.     Plaquenil [Hydroxychloroquine Sulfate] Other (See Comments)    Reaction:  GI upset    Reglan [Metoclopramide] Other (See Comments)    Reaction:  GI upset    Requip [Ropinirole Hcl] Other (See Comments)    Reaction:  GI upset    Septra [Sulfamethoxazole-Trimethoprim] Nausea And Vomiting   Shellfish Allergy Nausea And Vomiting    Allergies as of 06/06/2020      Reactions   Adrenalone    Lactose Other (See Comments)   abd pain, lactose intolerant   Morphine Other (See Comments)   Sulfa Antibiotics Other (See Comments)   Headache,  very sick   Codeine Other (See Comments)   Reaction:  Headaches and nightmares    Lactose Intolerance (gi) Nausea And Vomiting   Latex Rash   Lyrica [pregabalin] Swelling, Other (See Comments)   Reaction:  Leg swelling   Other Other (See Comments)   Pt states that pain medications give her nightmares.     Plaquenil [hydroxychloroquine Sulfate] Other (See Comments)   Reaction:  GI upset    Reglan [metoclopramide] Other (See Comments)   Reaction:  GI upset    Requip [ropinirole Hcl] Other (See Comments)   Reaction:  GI upset    Septra [sulfamethoxazole-trimethoprim] Nausea And Vomiting   Shellfish Allergy Nausea And Vomiting      Medication List       Accurate as of June 06, 2020 11:59 PM. If you have any questions, ask your nurse or doctor.        acetaminophen 500 MG tablet Commonly known as: TYLENOL Take 500 mg by mouth daily as needed for mild pain. not to exceed 3 gm in 24 hours. As needed for arthritis pain   TYLENOL 500 MG tablet Generic drug: acetaminophen Take 500 mg by mouth 2 (two) times daily.     AeroChamber MV inhaler by Other route. Use as instructed as needed   albuterol 108 (90 Base) MCG/ACT inhaler Commonly known as: VENTOLIN HFA Inhale 2 puffs into the lungs every 6 (six) hours as needed for wheezing or shortness of breath.   aspirin EC 81 MG tablet Take 81 mg by mouth daily.   Calcium 600/Vitamin D3 600-800 MG-UNIT Tabs Generic drug: Calcium Carb-Cholecalciferol Take 1 tablet by mouth daily.   carbidopa-levodopa 25-100 MG tablet Commonly known as: SINEMET IR Take 2 tablets by mouth in the morning and at bedtime.   Cholecalciferol 50 MCG (2000 UT) Tabs Take 1 tablet by mouth daily.   clotrimazole-betamethasone cream Commonly known as: Lotrisone Apply 1 application topically 2 (two) times daily.   docusate sodium 100 MG capsule Commonly known as: COLACE Take 100 mg by mouth at bedtime.   eucerin cream Apply 1 application topically daily.   ezetimibe 10 MG tablet Commonly known as: ZETIA TAKE 1 TABLET ONCE DAILY.   furosemide 20 MG tablet Commonly known as: LASIX Take 20 mg by mouth daily.   hydrocortisone 2.5 % cream Apply 1 application topically 2 (two) times daily.   hydroxychloroquine 200 MG tablet Commonly known as: PLAQUENIL Take 200 mg by mouth daily.   lansoprazole 30 MG disintegrating tablet Commonly known as: PREVACID SOLUTAB Take 30 mg by mouth daily at 12 noon.   levothyroxine 50 MCG tablet Commonly known as: SYNTHROID Take 50 mcg by mouth daily before breakfast.   lubiprostone 24 MCG capsule Commonly known as: AMITIZA Take 24 mcg by mouth 2 (two) times daily with a meal.   multivitamin with minerals tablet Take 1 tablet by mouth daily.   PRESERVISION AREDS 2 PO Take 1 tablet by mouth daily.   nystatin powder Commonly known as: MYCOSTATIN/NYSTOP Apply topically 2 (two) times daily as needed.   potassium chloride 10 MEQ tablet Commonly known as: KLOR-CON Take 10 mEq by mouth daily.   pramipexole 1 MG tablet Commonly  known as: MIRAPEX Take 1 mg by mouth 2 (two) times daily.   QUEtiapine 25 MG tablet Commonly known as: SEROQUEL Take 12.5 mg by mouth every morning. 1/2 tablet to = 12.5 mg QAM   QUEtiapine 25 MG tablet Commonly known as: SEROQUEL Take 25 mg by mouth  daily.   rosuvastatin 20 MG tablet Commonly known as: CRESTOR Take 20 mg by mouth daily.   senna-docusate 8.6-50 MG tablet Commonly known as: Senokot-S Take 1 tablet by mouth at bedtime.   sertraline 50 MG tablet Commonly known as: ZOLOFT Take 75 mg by mouth at bedtime. 1-1/2 tablets to = 75 mg   Slow Fe 142 (45 Fe) MG Tbcr Generic drug: Ferrous Sulfate Take 1 tablet by mouth. Daily   Systane 0.4-0.3 % Soln Generic drug: Polyethyl Glycol-Propyl Glycol Apply 1 drop to eye 2 (two) times daily as needed (dry eyes).       Review of Systems  Constitutional: Negative for activity change, appetite change and fever.  HENT: Positive for hearing loss. Negative for congestion and voice change.   Eyes: Negative for visual disturbance.  Respiratory: Negative for cough and shortness of breath.        Chronic DOE  Cardiovascular: Positive for leg swelling.  Gastrointestinal: Positive for nausea. Negative for abdominal pain, constipation, diarrhea and vomiting.  Genitourinary: Positive for hematuria. Negative for dysuria and flank pain.  Musculoskeletal: Positive for arthralgias, back pain, gait problem and myalgias.       Chronic lower back pain.   Skin: Negative for color change.       S/p percutaneous nephrostomy of the right   Neurological: Negative for speech difficulty, weakness and light-headedness.       Memory lapses.   Psychiatric/Behavioral: Negative for behavioral problems, hallucinations and sleep disturbance. The patient is not nervous/anxious.     Immunization History  Administered Date(s) Administered   Influenza Split 09/09/2013   Influenza, High Dose Seasonal PF 09/12/2018, 09/15/2019   Influenza,inj,Quad  PF,6+ Mos 09/11/2016   Moderna SARS-COVID-2 Vaccination 12/12/2019, 01/09/2020   Pneumococcal Conjugate-13 04/15/2014   Pneumococcal Polysaccharide-23 09/28/2005   Tdap 09/01/2015   Zoster Recombinat (Shingrix) 06/11/2018, 09/10/2018   Pertinent  Health Maintenance Due  Topic Date Due   INFLUENZA VACCINE  07/10/2020   DEXA SCAN  Completed   PNA vac Low Risk Adult  Completed   Fall Risk  11/16/2019 04/23/2019 04/09/2019 10/29/2018 10/27/2018  Falls in the past year? 0 0 0 0 1  Comment - - - fell June 2019 -  Number falls in past yr: - 0 0 - 0  Injury with Fall? - 0 0 - 1  Comment - - - - -  Risk for fall due to : - - - - -  Risk for fall due to: Comment - - - - -   Functional Status Survey:    Vitals:   06/06/20 1523  BP: 116/66  Pulse: 78  Resp: 15  Temp: (!) 97.4 F (36.3 C)  SpO2: 94%  Weight: 149 lb 6.4 oz (67.8 kg)  Height: 5\' 2"  (1.575 m)   Body mass index is 27.33 kg/m. Physical Exam Vitals and nursing note reviewed.  Constitutional:      Appearance: Normal appearance.  HENT:     Head: Normocephalic and atraumatic.     Mouth/Throat:     Mouth: Mucous membranes are moist.  Eyes:     Extraocular Movements: Extraocular movements intact.     Conjunctiva/sclera: Conjunctivae normal.     Pupils: Pupils are equal, round, and reactive to light.  Cardiovascular:     Rate and Rhythm: Normal rate and regular rhythm.     Heart sounds: Murmur heard.   Pulmonary:     Breath sounds: No rhonchi or rales.  Abdominal:     General:  Bowel sounds are normal.     Palpations: Abdomen is soft.     Tenderness: There is no abdominal tenderness. There is no right CVA tenderness, left CVA tenderness, guarding or rebound.     Hernia: No hernia is present.  Musculoskeletal:        General: No swelling or tenderness.     Cervical back: Normal range of motion and neck supple.     Right lower leg: Edema present.     Left lower leg: Edema present.     Comments: Trace to 1+  edema BLE.  Arthritic changes in fingers, R>L  Skin:    General: Skin is warm and dry.     Comments: S/p the right percutaneous nephrostomy.   Neurological:     General: No focal deficit present.     Mental Status: She is alert. Mental status is at baseline.     Gait: Gait abnormal.     Comments: Oriented to person, place.   Psychiatric:        Mood and Affect: Mood normal.        Behavior: Behavior normal.     Labs reviewed: Recent Labs    04/21/20 0325 04/22/20 0605 04/22/20 0903 04/23/20 0350 04/23/20 0350 04/24/20 0423 04/25/20 0427 05/24/20 1015  NA 143  --    < > 144   < > 145 146* 141  K 3.4*  --    < > 3.1*   < > 3.0* 3.8 3.5  CL 115*  --    < > 119*   < > 115* 115* 107  CO2 20*  --    < > 21*   < > 22 26 24   GLUCOSE 129*  --    < > 80   < > 85 88 100*  BUN 35*  --    < > 33*   < > 26* 16 12  CREATININE 1.23*  --    < > 1.01*   < > 0.78 0.81 0.81  CALCIUM 7.2*  --    < > 7.2*   < > 7.0* 7.6* 8.9  MG 3.0* 2.6*  --  2.5*  --   --   --   --   PHOS 3.2 2.6  --  2.3*  --   --   --   --    < > = values in this interval not displayed.   Recent Labs    04/20/20 0455 04/21/20 0325 04/24/20 0423  AST 117* 50* 32  ALT 18 8 7   ALKPHOS 95 95 77  BILITOT 0.5 0.5 0.6  PROT 5.1* 5.4* 4.7*  ALBUMIN 2.3* 2.3* 2.3*   Recent Labs    02/12/20 0000 03/31/20 0000 04/19/20 0200 04/19/20 1442 04/24/20 0423 04/24/20 0423 04/25/20 0427 04/26/20 0000 05/24/20 1015  WBC 7.5   < > 1.0*   < > 14.5*   < > 13.8* 10.7 7.8  NEUTROABS 4,845  --  0.7*  --   --   --   --  7,447  --   HGB 11.0*   < > 9.1*   < > 8.8*   < > 9.4* 9.2* 9.8*  HCT 33*   < > 29.5*   < > 27.1*   < > 29.7* 28* 30.9*  MCV  --   --  101.4*   < > 97.5  --  96.7  --  98.4  PLT  --    < > 131*   < >  194   < > 226 245 286   < > = values in this interval not displayed.   Lab Results  Component Value Date   TSH 1.62 09/08/2019   No results found for: HGBA1C Lab Results  Component Value Date   CHOL 118  07/14/2019   HDL 50 07/14/2019   LDLCALC 32 07/14/2019   TRIG 182 (H) 07/14/2019   CHOLHDL 2.4 07/14/2019    Significant Diagnostic Results in last 30 days:  DG C-Arm 1-60 Min-No Report  Result Date: 05/24/2020 Fluoroscopy was utilized by the requesting physician.  No radiographic interpretation.    Assessment/Plan: GERD (gastroesophageal reflux disease) Nausea after am meds, will add Famotidine 20mg  qd, continue Lansoprazole 30mg  qd.   Hypothyroidism Stable, continue Levothyroxine 49mcg qd, TSH 1.62 09/08/19  Parkinson's disease SN[facility] for care needs, feeds self, continue Sinemet, MiraPex.   Erosive (osteo)arthritis Continue Plaquenil, Tylenol.   Depression, psychotic ([place]) Her mood is stable, continue Sertraline 75mg  qd, Quetiapine 25mg  qd, 12.5mg  qd   Iron deficiency anemia Continue Fe, Hgb 9.8 3/40/37  Chronic diastolic CHF (congestive heart failure) ([facility]) Chronic DOE, trace edema BLE, continue  Furosemide 20mg  qd, cardiology f/u 06/07/20   Chronic constipation Stable, continue  Senokot S I qd, Amitiza 60mcg qd, Colace qd     Family/ staff Communication: plan of care reviewed with the patient and charge nurse.   Labs/tests ordered:  none  Time spend 25 minutes.

## 2020-06-07 ENCOUNTER — Encounter: Payer: Self-pay | Admitting: Nurse Practitioner

## 2020-06-08 ENCOUNTER — Ambulatory Visit (INDEPENDENT_AMBULATORY_CARE_PROVIDER_SITE_OTHER): Payer: Medicare Other | Admitting: Physician Assistant

## 2020-06-08 ENCOUNTER — Other Ambulatory Visit: Payer: Self-pay

## 2020-06-08 ENCOUNTER — Encounter: Payer: Self-pay | Admitting: Physician Assistant

## 2020-06-08 VITALS — BP 98/50 | HR 86 | Ht 62.0 in | Wt 139.6 lb

## 2020-06-08 DIAGNOSIS — I350 Nonrheumatic aortic (valve) stenosis: Secondary | ICD-10-CM

## 2020-06-08 DIAGNOSIS — R600 Localized edema: Secondary | ICD-10-CM

## 2020-06-08 DIAGNOSIS — E785 Hyperlipidemia, unspecified: Secondary | ICD-10-CM

## 2020-06-08 NOTE — Patient Instructions (Signed)
Medication Instructions:  Your physician recommends that you continue on your current medications as directed. Please refer to the Current Medication list given to you today.  *If you need a refill on your cardiac medications before your next appointment, please call your pharmacy*   Lab Work: None  If you have labs (blood work) drawn today and your tests are completely normal, you will receive your results only by:  [place] (if you have MyChart) OR  A paper copy in the mail If you have any lab test that is abnormal or we need to change your treatment, we will call you to review the results.   Testing/Procedures: None   Follow-Up: Follow up with Dr. Radford Pax on 08/09/20 at 11:00 AM   Other Instructions None

## 2020-06-20 ENCOUNTER — Non-Acute Institutional Stay (SKILLED_NURSING_FACILITY): Payer: Medicare Other | Admitting: Nurse Practitioner

## 2020-06-20 ENCOUNTER — Ambulatory Visit: Payer: Medicare Other | Admitting: Podiatry

## 2020-06-20 ENCOUNTER — Encounter: Payer: Self-pay | Admitting: Nurse Practitioner

## 2020-06-20 DIAGNOSIS — R310 Gross hematuria: Secondary | ICD-10-CM | POA: Diagnosis not present

## 2020-06-20 DIAGNOSIS — G20A1 Parkinson's disease without dyskinesia, without mention of fluctuations: Secondary | ICD-10-CM

## 2020-06-20 DIAGNOSIS — N390 Urinary tract infection, site not specified: Secondary | ICD-10-CM

## 2020-06-20 DIAGNOSIS — K22719 Barrett's esophagus with dysplasia, unspecified: Secondary | ICD-10-CM | POA: Diagnosis not present

## 2020-06-20 DIAGNOSIS — D500 Iron deficiency anemia secondary to blood loss (chronic): Secondary | ICD-10-CM

## 2020-06-20 DIAGNOSIS — E02 Subclinical iodine-deficiency hypothyroidism: Secondary | ICD-10-CM

## 2020-06-20 DIAGNOSIS — G20 Parkinson's disease: Secondary | ICD-10-CM

## 2020-06-20 DIAGNOSIS — K5909 Other constipation: Secondary | ICD-10-CM

## 2020-06-20 DIAGNOSIS — I5032 Chronic diastolic (congestive) heart failure: Secondary | ICD-10-CM | POA: Diagnosis not present

## 2020-06-20 DIAGNOSIS — M154 Erosive (osteo)arthritis: Secondary | ICD-10-CM

## 2020-06-20 DIAGNOSIS — F323 Major depressive disorder, single episode, severe with psychotic features: Secondary | ICD-10-CM

## 2020-06-20 NOTE — Assessment & Plan Note (Signed)
Recurrent, Hx of renal lithiasis, recurrent UTI, recent percutaneous nephrostomy tube removed, will update UA C/S, CBC/diff

## 2020-06-20 NOTE — Progress Notes (Addendum)
Location:   [facility] Room Number: 45 Place of Service:  SNF (31) Provider:  Marda Stalker, Lennie Odor NP   [name], MD  Patient Care Team: [name], MD as PCP - General (Internal Medicine) [name], MD as PCP - Cardiology (Cardiology) [name], MD (Neurosurgery) [name], MD (Neurology) [name], MD (Inactive) as Consulting Physician (Gastroenterology) [name], MD as Consulting Physician (Urology) Khyla Mccumbers X, NP as Nurse Practitioner (Internal Medicine)  Extended Emergency Contact Information Primary Emergency Contact: [facility] Address: [address]          [place], [place] [address] [name] of [place] Phone: [phone] Mobile Phone: [phone] Relation: Spouse Secondary Emergency Contact: [name] Mobile Phone: [phone] Relation: Daughter Preferred language: [name] Interpreter needed? No  Code Status:  Full Code Goals of care: Advanced Directive information Advanced Directives 06/06/2020  Does Patient Have a Medical Advance Directive? Yes  Type of Advance Directive Living will  Does patient want to make changes to medical advance directive? No - Patient declined  Copy of [place] in Chart? -  Would patient like information on creating a medical advance directive? -  Pre-existing out of facility DNR order (yellow form or pink MOST form) -     Chief Complaint  Patient presents with  . Acute Visit    Hematuria    HPI:  Pt is a 86 y.o. female seen today for an acute visit for reported recurrent, Hx of renal lithiasis, recurrent UTI, recent percutaneous nephrostomy tube removed. The patient denied dysuria, abd pain, nausea, or vomiting, she is afebrile.    GERD, takes Lansoprazole 30mg  qd, Famotidine 20mg  qd.              Parkinson's, takes MiraPex 1mg  bid, Sinemet 25/100 II bid.              Depression, takes Sertraline 75mg  qd, Quetiapine  25mg  qd, 12.5mg  qd             Constipation, takes Senokot S I qd, Amitiza 54mcg qd, Colace qd             Hypothyroidism, takes Levothyroxine 91mcg qd.              Erosive arthritis, takes Plaquenil 200mg  qd, Tylenol 500mg  bid.              CHF/trace edema BLE, takes Furosemide 20mg  qd, cardiology             Anemia, takes Fe     Past Medical History:  Diagnosis Date  . Abnormal nuclear stress test   . Actinic cheilitis 04/23/2019  . Anemia    iron deficiency   . Anxiety   . Aortic stenosis    severe by echo 2021   . Arthritis   . Broken arm    right   . Chronic diastolic CHF (congestive heart failure) ([place])   . Chronic pain   . Cognitive communication deficit   . Constipation   . Depression   . Erosive (osteo)arthritis 03/09/2020  . Family history of adverse reaction to anesthesia    pt. states sister vomits  . Fibromyalgia   . GERD (gastroesophageal reflux disease)   . H/O hiatal hernia   . Hallucinations, visual 07/21/2018   03/10/20 psych consult.   . Hematuria 05/18/2020  . History of bronchitis   . History of kidney stones   . Hypercholesterolemia   . Hypertension  dr t turner  . Hypothyroidism   . Memory loss 04/21/2018   08/08/18 CT head no traumatic findings, atrophy.  09/27/19 MMSE 25/30, failed the clock drawing  . Microhematuria 03/24/2015  . Neuropathy   . Osteoporosis   . Parkinson disease ([place])   . PVD (peripheral vascular disease) (HCC)    99% stenosis of left anteiror tibial artery, mod stenosis of left distal SFA and popliteal artery followed by [name]  . RBBB    noted on EKG 2018  . Restless leg syndrome   . Sepsis ([place])    hx of due to [facility]  . Septic shock ([place]) 04/19/2020  . Shortness of breath    with exertion - chronic   . Sjogren's disease ([place])   . Sjogren's syndrome [facility] [place]) 02/04/2018   04/16/19 rheumatology: Erosive OA of hands, Sjogrens syndrome, low back pain at multiple sites, recurrent kidney stones, age related osteoporosis w/o  current pathological fracture, f/u 6 months.   . SOB (shortness of breath)    chronic due to diastolic dysfunction, deconditioning, obesity  . Spinal stenosis    lumbar region   . Tremor   . Unstable gait 04/21/2018  . Unsteadiness on feet   . Urinary frequency 09/18/2018  . Urinary tract infection   . Visual hallucinations    Past Surgical History:  Procedure Laterality Date  . ABDOMINAL HYSTERECTOMY    . BACK SURGERY     4 back surgeries,   lumbar fusion  . CARDIAC CATHETERIZATION  2006   normal  . CYSTOSCOPY/URETEROSCOPY/HOLMIUM LASER/STENT PLACEMENT Left 01/06/2019   Procedure: CYSTOSCOPY/RETROGRADE/URETEROSCOPY/HOLMIUM LASER/BASKET RETRIEVAL/STENT PLACEMENT;  Surgeon: Ceasar Mons, MD;  Location: [facility];  Service: Urology;  Laterality: Left;  . CYSTOSCOPY/URETEROSCOPY/HOLMIUM LASER/STENT PLACEMENT Bilateral 05/24/2020   Procedure: CYSTOSCOPY/RETROGRADE/URETEROSCOPY/HOLMIUM LASER/STENT PLACEMENT;  Surgeon: Ceasar Mons, MD;  Location: [facility];  Service: Urology;  Laterality: Bilateral;  . EYE SURGERY Bilateral    cateracts  . falls     variious fall, broken wrist,and toes  . FRACTURE SURGERY Right April 2016   Wrist, Pt. fell  . IR NEPHROSTOMY PLACEMENT RIGHT  04/19/2020  . kidney stone removal Left 01/20/2019  . RIGHT/LEFT HEART CATH AND CORONARY ANGIOGRAPHY N/A 02/05/2018   Procedure: RIGHT/LEFT HEART CATH AND CORONARY ANGIOGRAPHY;  Surgeon: Troy Sine, MD;  Location: [facility];  Service: Cardiovascular;  Laterality: N/A;  . SPINAL CORD STIMULATOR INSERTION N/A 09/09/2015   Procedure: LUMBAR SPINAL CORD STIMULATOR INSERTION;  Surgeon: Clydell Hakim, MD;  Location: [facility];  Service: Neurosurgery;  Laterality: N/A;  LUMBAR SPINAL CORD STIMULATOR INSERTION  . SPINE SURGERY  April 2013   Back X's 4  . TOTAL HIP ARTHROPLASTY     right  . WRIST FRACTURE SURGERY Bilateral     Allergies  Allergen Reactions  . Adrenalone   . Lactose  Other (See Comments)    abd pain, lactose intolerant  . Morphine Other (See Comments)  . Sulfa Antibiotics Other (See Comments)    Headache, very sick  . Codeine Other (See Comments)    Reaction:  Headaches and nightmares   . Lactose Intolerance (Gi) Nausea And Vomiting  . Latex Rash  . Lyrica [Pregabalin] Swelling and Other (See Comments)    Reaction:  Leg swelling  . Other Other (See Comments)    Pt states that pain medications give her nightmares.    . Plaquenil [Hydroxychloroquine Sulfate] Other (See Comments)    Reaction:  GI upset   . Reglan [Metoclopramide]  Other (See Comments)    Reaction:  GI upset   . Requip [Ropinirole Hcl] Other (See Comments)    Reaction:  GI upset   . Septra [Sulfamethoxazole-Trimethoprim] Nausea And Vomiting  . Shellfish Allergy Nausea And Vomiting    Allergies as of 06/20/2020      Reactions   Adrenalone    Lactose Other (See Comments)   abd pain, lactose intolerant   Morphine Other (See Comments)   Sulfa Antibiotics Other (See Comments)   Headache, very sick   Codeine Other (See Comments)   Reaction:  Headaches and nightmares    Lactose Intolerance (gi) Nausea And Vomiting   Latex Rash   Lyrica [pregabalin] Swelling, Other (See Comments)   Reaction:  Leg swelling   Other Other (See Comments)   Pt states that pain medications give her nightmares.     Plaquenil [hydroxychloroquine Sulfate] Other (See Comments)   Reaction:  GI upset    Reglan [metoclopramide] Other (See Comments)   Reaction:  GI upset    Requip [ropinirole Hcl] Other (See Comments)   Reaction:  GI upset    Septra [sulfamethoxazole-trimethoprim] Nausea And Vomiting   Shellfish Allergy Nausea And Vomiting      Medication List       Accurate as of June 20, 2020 11:59 PM. If you have any questions, ask your nurse or doctor.        acetaminophen 500 MG tablet Commonly known as: TYLENOL Take 500 mg by mouth daily as needed for mild pain. not to exceed 3 gm in 24 hours.  As needed for arthritis pain   TYLENOL 500 MG tablet Generic drug: acetaminophen Take 500 mg by mouth 2 (two) times daily.   AeroChamber MV inhaler by Other route. Use as instructed as needed   albuterol 108 (90 Base) MCG/ACT inhaler Commonly known as: VENTOLIN HFA Inhale 2 puffs into the lungs every 6 (six) hours as needed for wheezing or shortness of breath.   aspirin EC 81 MG tablet Take 81 mg by mouth daily.   Calcium 600/Vitamin D3 600-800 MG-UNIT Tabs Generic drug: Calcium Carb-Cholecalciferol Take 1 tablet by mouth daily.   carbidopa-levodopa 25-100 MG tablet Commonly known as: SINEMET IR Take 2 tablets by mouth in the morning and at bedtime.   Cholecalciferol 50 MCG (2000 UT) Tabs Take 1 tablet by mouth daily.   clotrimazole-betamethasone cream Commonly known as: Lotrisone Apply 1 application topically 2 (two) times daily.   docusate sodium 100 MG capsule Commonly known as: COLACE Take 100 mg by mouth at bedtime.   eucerin cream Apply 1 application topically daily.   ezetimibe 10 MG tablet Commonly known as: ZETIA TAKE 1 TABLET ONCE DAILY.   famotidine 20 MG tablet Commonly known as: PEPCID Take 20 mg by mouth daily.   furosemide 20 MG tablet Commonly known as: LASIX Take 20 mg by mouth daily.   hydrocortisone 2.5 % cream Apply 1 application topically 2 (two) times daily.   hydroxychloroquine 200 MG tablet Commonly known as: PLAQUENIL Take 200 mg by mouth daily.   lansoprazole 30 MG disintegrating tablet Commonly known as: PREVACID SOLUTAB Take 30 mg by mouth daily at 12 noon.   levothyroxine 50 MCG tablet Commonly known as: SYNTHROID Take 50 mcg by mouth daily before breakfast.   lubiprostone 24 MCG capsule Commonly known as: AMITIZA Take 24 mcg by mouth 2 (two) times daily with a meal.   multivitamin with minerals tablet Take 1 tablet by mouth daily.   PRESERVISION  AREDS 2 PO Take 1 tablet by mouth daily.   nystatin powder Commonly  known as: MYCOSTATIN/NYSTOP Apply topically 2 (two) times daily as needed.   potassium chloride 10 MEQ tablet Commonly known as: KLOR-CON Take 10 mEq by mouth daily.   pramipexole 1 MG tablet Commonly known as: MIRAPEX Take 1 mg by mouth 2 (two) times daily.   QUEtiapine 25 MG tablet Commonly known as: SEROQUEL Take 12.5 mg by mouth every morning. 1/2 tablet to = 12.5 mg QAM   QUEtiapine 25 MG tablet Commonly known as: SEROQUEL Take 25 mg by mouth daily.   rosuvastatin 20 MG tablet Commonly known as: CRESTOR Take 20 mg by mouth daily.   senna-docusate 8.6-50 MG tablet Commonly known as: Senokot-S Take 1 tablet by mouth at bedtime.   sertraline 50 MG tablet Commonly known as: ZOLOFT Take 75 mg by mouth at bedtime. 1-1/2 tablets to = 75 mg   Slow Fe 142 (45 Fe) MG Tbcr Generic drug: Ferrous Sulfate Take 1 tablet by mouth. Daily   Systane 0.4-0.3 % Soln Generic drug: Polyethyl Glycol-Propyl Glycol Apply 1 drop to eye 2 (two) times daily as needed (dry eyes).       Review of Systems  Constitutional: Negative for appetite change, fatigue and fever.  HENT: Positive for hearing loss. Negative for congestion and voice change.   Eyes: Negative for visual disturbance.  Respiratory: Positive for shortness of breath. Negative for cough.        Chronic DOE  Cardiovascular: Positive for leg swelling.  Gastrointestinal: Negative for abdominal pain, constipation, diarrhea, nausea and vomiting.  Genitourinary: Positive for hematuria. Negative for dysuria and flank pain.  Musculoskeletal: Positive for arthralgias, back pain, gait problem and myalgias.       Chronic lower back pain.   Skin: Negative for color change.  Neurological: Negative for speech difficulty, weakness and light-headedness.       Memory lapses.   Psychiatric/Behavioral: Positive for hallucinations. Negative for behavioral problems and sleep disturbance. The patient is not nervous/anxious.        On and off  visual hallucination    Immunization History  Administered Date(s) Administered  . Influenza Split 09/09/2013  . Influenza, High Dose Seasonal PF 09/12/2018, 09/15/2019  . Influenza,inj,Quad PF,6+ Mos 09/11/2016  . Moderna SARS-COVID-2 Vaccination 12/12/2019, 01/09/2020  . Pneumococcal Conjugate-13 04/15/2014  . Pneumococcal Polysaccharide-23 09/28/2005  . Tdap 09/01/2015  . Zoster Recombinat (Shingrix) 06/11/2018, 09/10/2018   Pertinent  Health Maintenance Due  Topic Date Due  . INFLUENZA VACCINE  07/10/2020  . DEXA SCAN  Completed  . PNA vac Low Risk Adult  Completed   Fall Risk  11/16/2019 04/23/2019 04/09/2019 10/29/2018 10/27/2018  Falls in the past year? 0 0 0 0 1  Comment - - - fell June 2019 -  Number falls in past yr: - 0 0 - 0  Injury with Fall? - 0 0 - 1  Comment - - - - -  Risk for fall due to : - - - - -  Risk for fall due to: Comment - - - - -   Functional Status Survey:    Vitals:   06/20/20 1226  BP: 122/74  Pulse: 74  Resp: 18  Temp: 97.7 F (36.5 C)  SpO2: 97%  Weight: 140 lb 12.8 oz (63.9 kg)  Height: 5\' 2"  (1.575 m)   Body mass index is 25.75 kg/m. Physical Exam Vitals and nursing note reviewed.  Constitutional:      Appearance: Normal  appearance.  HENT:     Head: Normocephalic and atraumatic.     Mouth/Throat:     Mouth: Mucous membranes are moist.  Eyes:     Extraocular Movements: Extraocular movements intact.     Conjunctiva/sclera: Conjunctivae normal.     Pupils: Pupils are equal, round, and reactive to light.  Cardiovascular:     Rate and Rhythm: Normal rate and regular rhythm.     Heart sounds: Murmur heard.   Pulmonary:     Breath sounds: No rhonchi or rales.  Abdominal:     General: Bowel sounds are normal.     Palpations: Abdomen is soft.     Tenderness: There is no abdominal tenderness. There is no right CVA tenderness, left CVA tenderness, guarding or rebound.     Hernia: No hernia is present.  Musculoskeletal:      Cervical back: Normal range of motion and neck supple.     Right lower leg: Edema present.     Left lower leg: Edema present.     Comments: Trace to 1+ edema BLE.  Arthritic changes in fingers, R>L  Skin:    General: Skin is warm and dry.     Comments: S/p the right percutaneous nephrostomy.   Neurological:     General: No focal deficit present.     Mental Status: She is alert. Mental status is at baseline.     Gait: Gait abnormal.     Comments: Oriented to person, place.   Psychiatric:        Mood and Affect: Mood normal.        Behavior: Behavior normal.     Labs reviewed: Recent Labs    04/21/20 0325 04/22/20 0605 04/22/20 0903 04/23/20 0350 04/23/20 0350 04/24/20 0423 04/25/20 0427 05/24/20 1015  NA 143  --    < > 144   < > 145 146* 141  K 3.4*  --    < > 3.1*   < > 3.0* 3.8 3.5  CL 115*  --    < > 119*   < > 115* 115* 107  CO2 20*  --    < > 21*   < > 22 26 24   GLUCOSE 129*  --    < > 80   < > 85 88 100*  BUN 35*  --    < > 33*   < > 26* 16 12  CREATININE 1.23*  --    < > 1.01*   < > 0.78 0.81 0.81  CALCIUM 7.2*  --    < > 7.2*   < > 7.0* 7.6* 8.9  MG 3.0* 2.6*  --  2.5*  --   --   --   --   PHOS 3.2 2.6  --  2.3*  --   --   --   --    < > = values in this interval not displayed.   Recent Labs    04/20/20 0455 04/21/20 0325 04/24/20 0423  AST 117* 50* 32  ALT 18 8 7   ALKPHOS 95 95 77  BILITOT 0.5 0.5 0.6  PROT 5.1* 5.4* 4.7*  ALBUMIN 2.3* 2.3* 2.3*   Recent Labs    02/12/20 0000 03/31/20 0000 04/19/20 0200 04/19/20 1442 04/24/20 0423 04/24/20 0423 04/25/20 0427 04/26/20 0000 05/24/20 1015  WBC 7.5   < > 1.0*   < > 14.5*   < > 13.8* 10.7 7.8  NEUTROABS 4,845  --  0.7*  --   --   --   --  7,447  --   HGB 11.0*   < > 9.1*   < > 8.8*   < > 9.4* 9.2* 9.8*  HCT 33*   < > 29.5*   < > 27.1*   < > 29.7* 28* 30.9*  MCV  --   --  101.4*   < > 97.5  --  96.7  --  98.4  PLT  --    < > 131*   < > 194   < > 226 245 286   < > = values in this interval not  displayed.   Lab Results  Component Value Date   TSH 1.62 09/08/2019   No results found for: HGBA1C Lab Results  Component Value Date   CHOL 118 07/14/2019   HDL 50 07/14/2019   LDLCALC 32 07/14/2019   TRIG 182 (H) 07/14/2019   CHOLHDL 2.4 07/14/2019    Significant Diagnostic Results in last 30 days:  No results found.  Assessment/Plan Hematuria Recurrent, Hx of renal lithiasis, recurrent UTI, recent percutaneous nephrostomy tube removed, will update UA C/S, CBC/diff   Chronic diastolic CHF (congestive heart failure) (HCC) Chronic DOE, continue Furosemide.   Barrett esophagus Stable, continue Famotidine, Lansoprazole.   Chronic constipation Stable, continue Senokot S I qd, Amitiza 72mcg qd, Colace qd   Hypothyroidism Stable, continue Levothyroxine, TSH wnl 08/2019  Parkinson's disease Gait difficulty, otherwise is not disabling, continue Sinemet, MiraPex.   Erosive (osteo)arthritis Stable, continue  Plaquenil 200mg  qd, Tylenol 500mg  bid.    Depression, psychotic ([place]) Her mood is stable, continue Sertraline 75mg  qd, Quetiapine 25mg  qd, 12.5mg  qd   Iron deficiency anemia Stable, continue Fe, Hgb 9.8 04/23/20  Acute lower UTI 06/21/20 urine culture S Epidermidis 06/24/20 Nitrofurantoin 100mg  bid x 7 days.     Family/ staff Communication: plan of care reviewed with the patient and charge nurse.   Labs/tests ordered:  CBC/diff, UA C/S  Time spend 25 minutes.

## 2020-06-21 DIAGNOSIS — M25511 Pain in right shoulder: Secondary | ICD-10-CM | POA: Diagnosis not present

## 2020-06-21 DIAGNOSIS — R2681 Unsteadiness on feet: Secondary | ICD-10-CM | POA: Diagnosis not present

## 2020-06-21 DIAGNOSIS — F419 Anxiety disorder, unspecified: Secondary | ICD-10-CM | POA: Diagnosis not present

## 2020-06-21 DIAGNOSIS — F323 Major depressive disorder, single episode, severe with psychotic features: Secondary | ICD-10-CM | POA: Diagnosis not present

## 2020-06-21 DIAGNOSIS — K5909 Other constipation: Secondary | ICD-10-CM | POA: Diagnosis not present

## 2020-06-21 DIAGNOSIS — M154 Erosive (osteo)arthritis: Secondary | ICD-10-CM | POA: Diagnosis not present

## 2020-06-21 DIAGNOSIS — Z299 Encounter for prophylactic measures, unspecified: Secondary | ICD-10-CM | POA: Diagnosis not present

## 2020-06-21 DIAGNOSIS — M6281 Muscle weakness (generalized): Secondary | ICD-10-CM | POA: Diagnosis not present

## 2020-06-21 DIAGNOSIS — R29898 Other symptoms and signs involving the musculoskeletal system: Secondary | ICD-10-CM | POA: Diagnosis not present

## 2020-06-21 DIAGNOSIS — N390 Urinary tract infection, site not specified: Secondary | ICD-10-CM | POA: Diagnosis not present

## 2020-06-21 DIAGNOSIS — R441 Visual hallucinations: Secondary | ICD-10-CM | POA: Diagnosis not present

## 2020-06-21 DIAGNOSIS — D500 Iron deficiency anemia secondary to blood loss (chronic): Secondary | ICD-10-CM | POA: Diagnosis not present

## 2020-06-21 DIAGNOSIS — R279 Unspecified lack of coordination: Secondary | ICD-10-CM | POA: Diagnosis not present

## 2020-06-21 DIAGNOSIS — I350 Nonrheumatic aortic (valve) stenosis: Secondary | ICD-10-CM | POA: Diagnosis not present

## 2020-06-21 DIAGNOSIS — A4151 Sepsis due to Escherichia coli [E. coli]: Secondary | ICD-10-CM | POA: Diagnosis not present

## 2020-06-21 DIAGNOSIS — E039 Hypothyroidism, unspecified: Secondary | ICD-10-CM | POA: Diagnosis not present

## 2020-06-21 DIAGNOSIS — G8929 Other chronic pain: Secondary | ICD-10-CM | POA: Diagnosis not present

## 2020-06-21 DIAGNOSIS — R0602 Shortness of breath: Secondary | ICD-10-CM | POA: Diagnosis not present

## 2020-06-21 DIAGNOSIS — D649 Anemia, unspecified: Secondary | ICD-10-CM | POA: Diagnosis not present

## 2020-06-21 DIAGNOSIS — R278 Other lack of coordination: Secondary | ICD-10-CM | POA: Diagnosis not present

## 2020-06-21 LAB — CBC AND DIFFERENTIAL
HCT: 28 — AB (ref 36–46)
Hemoglobin: 8.9 — AB (ref 12.0–16.0)
Neutrophils Absolute: 5236
Platelets: 314 (ref 150–399)
WBC: 7.6

## 2020-06-21 LAB — CBC: RBC: 2.97 — AB (ref 3.87–5.11)

## 2020-06-21 NOTE — Assessment & Plan Note (Signed)
Stable, continue Famotidine, Lansoprazole.

## 2020-06-21 NOTE — Assessment & Plan Note (Signed)
Stable, continue Levothyroxine, TSH wnl 08/2019 

## 2020-06-21 NOTE — Assessment & Plan Note (Signed)
Stable, continue Senokot S I qd, Amitiza 75mcg qd, Colace qd

## 2020-06-21 NOTE — Assessment & Plan Note (Signed)
Stable, continue  Plaquenil 200mg  qd, Tylenol 500mg  bid.

## 2020-06-21 NOTE — Assessment & Plan Note (Signed)
Her mood is stable, continue Sertraline 75mg qd, Quetiapine 25mg qd, 12.5mg qd  

## 2020-06-21 NOTE — Assessment & Plan Note (Signed)
Gait difficulty, otherwise is not disabling, continue Sinemet, MiraPex.

## 2020-06-21 NOTE — Assessment & Plan Note (Signed)
Stable, continue Fe, Hgb 9.8 04/23/20

## 2020-06-21 NOTE — Assessment & Plan Note (Signed)
Chronic DOE, continue Furosemide.

## 2020-06-22 ENCOUNTER — Encounter: Payer: Self-pay | Admitting: Nurse Practitioner

## 2020-06-22 DIAGNOSIS — R278 Other lack of coordination: Secondary | ICD-10-CM | POA: Diagnosis not present

## 2020-06-22 DIAGNOSIS — M154 Erosive (osteo)arthritis: Secondary | ICD-10-CM | POA: Diagnosis not present

## 2020-06-22 DIAGNOSIS — A4151 Sepsis due to Escherichia coli [E. coli]: Secondary | ICD-10-CM | POA: Diagnosis not present

## 2020-06-22 DIAGNOSIS — R29898 Other symptoms and signs involving the musculoskeletal system: Secondary | ICD-10-CM | POA: Diagnosis not present

## 2020-06-22 DIAGNOSIS — M6281 Muscle weakness (generalized): Secondary | ICD-10-CM | POA: Diagnosis not present

## 2020-06-22 DIAGNOSIS — M25511 Pain in right shoulder: Secondary | ICD-10-CM | POA: Diagnosis not present

## 2020-06-23 ENCOUNTER — Ambulatory Visit (INDEPENDENT_AMBULATORY_CARE_PROVIDER_SITE_OTHER): Payer: Medicare Other | Admitting: Cardiovascular Disease

## 2020-06-23 ENCOUNTER — Other Ambulatory Visit: Payer: Self-pay

## 2020-06-23 ENCOUNTER — Encounter: Payer: Self-pay | Admitting: Cardiovascular Disease

## 2020-06-23 VITALS — BP 102/54 | HR 84 | Ht 62.0 in | Wt 136.4 lb

## 2020-06-23 DIAGNOSIS — I350 Nonrheumatic aortic (valve) stenosis: Secondary | ICD-10-CM | POA: Diagnosis not present

## 2020-06-23 DIAGNOSIS — D649 Anemia, unspecified: Secondary | ICD-10-CM | POA: Diagnosis not present

## 2020-06-23 DIAGNOSIS — I5032 Chronic diastolic (congestive) heart failure: Secondary | ICD-10-CM

## 2020-06-23 LAB — CBC AND DIFFERENTIAL
HCT: 27 — AB (ref 36–46)
Hemoglobin: 8.8 — AB (ref 12.0–16.0)
Platelets: 325 (ref 150–399)
WBC: 7.5

## 2020-06-23 LAB — CBC: RBC: 2.9 — AB (ref 3.87–5.11)

## 2020-06-23 NOTE — Patient Instructions (Addendum)
You have a follow-up visit with Dr. Burt Knack on August 17, 2020 at 2:20PM.

## 2020-06-23 NOTE — Progress Notes (Signed)
Cardiology Office Note:    Date:  06/25/2020   ID:  [name], DOB [birth year], MRN [id number]  PCP:  Amanda Dad, MD  [facility] Cardiologist:  Fransico Him, MD  [place] Electrophysiologist:  None   Referring MD: Amanda Dad, MD   Chief Complaint  Patient presents with  . Shortness of Breath    History of Present Illness:    [name] is a 86 y.o. female referred for evaluation of severe aortic stenosis by [name].  The patient is here with her husband today.  She has been followed for severe aortic stenosis with serial clinical exams and echo studies.  Her recent echocardiogram demonstrates progressive and now severe aortic stenosis compared to her prior study in January 2021.  On her previous study, peak and mean transaortic gradients were 52 and 26 mmHg, respectively.  This was felt to be consistent with moderate aortic stenosis.  Her recent study demonstrates preserved LV systolic function with LVEF 60 to 65%, a severely calcified and restricted aortic valve, and peak and mean transvalvular gradients of 66 and 41 mmHg, respectively.  The dimensionless index is now 0.21, decreased from previous value of 0.29.  The patient has not been in good health.  She has not lived independently in about 2 years.  She has previously ambulated with a walker and since her most recent hospitalization she has been using a wheelchair.  The patient was hospitalized in May with E. coli sepsis complicated by shock.  She was found to have an obstructive right kidney stone and she underwent ureteral stent placement.  The patient has urology follow-up in about 2 weeks with plans for ureteral stent removal at that time.  She has had a fair amount of hematuria but states that this is slowly improving.  From a cardiac perspective, she reports progressive dyspnea with minimal exertion.  She is limited by back pain, leg weakness, and shortness of breath.  She has occasional chest  discomfort but this has not been a primary symptom for her.  She denies lightheadedness or near syncope.  Past Medical History:  Diagnosis Date  . Abnormal nuclear stress test   . Actinic cheilitis 04/23/2019  . Anemia    iron deficiency   . Anxiety   . Aortic stenosis    severe by echo 2021   . Arthritis   . Broken arm    right   . Chronic diastolic CHF (congestive heart failure) ([place])   . Chronic pain   . Cognitive communication deficit   . Constipation   . Depression   . Erosive (osteo)arthritis 03/09/2020  . Family history of adverse reaction to anesthesia    pt. states sister vomits  . Fibromyalgia   . GERD (gastroesophageal reflux disease)   . H/O hiatal hernia   . Hallucinations, visual 07/21/2018   03/10/20 psych consult.   . Hematuria 05/18/2020  . History of bronchitis   . History of kidney stones   . Hypercholesterolemia   . Hypertension    dr t Padilla  . Hypothyroidism   . Memory loss 04/21/2018   08/08/18 CT head no traumatic findings, atrophy.  09/27/19 MMSE 25/30, failed the clock drawing  . Microhematuria 03/24/2015  . Neuropathy   . Osteoporosis   . Parkinson disease ([place])   . PVD (peripheral vascular disease) (HCC)    99% stenosis of left anteiror tibial artery, mod stenosis of left distal SFA and popliteal artery followed by [name]  .  RBBB    noted on EKG 2018  . Restless leg syndrome   . Sepsis ([place])    hx of due to [name]  . Septic shock ([place]) 04/19/2020  . Shortness of breath    with exertion - chronic   . Sjogren's disease ([place])   . Sjogren's syndrome [facility]) 02/04/2018   04/16/19 rheumatology: Erosive OA of hands, Sjogrens syndrome, low back pain at multiple sites, recurrent kidney stones, age related osteoporosis w/o current pathological fracture, f/u 6 months.   . SOB (shortness of breath)    chronic due to diastolic dysfunction, deconditioning, obesity  . Spinal stenosis    lumbar region   . Tremor   . Unstable gait 04/21/2018  . Unsteadiness on  feet   . Urinary frequency 09/18/2018  . Urinary tract infection   . Visual hallucinations     Past Surgical History:  Procedure Laterality Date  . ABDOMINAL HYSTERECTOMY    . BACK SURGERY     4 back surgeries,   lumbar fusion  . CARDIAC CATHETERIZATION  2006   normal  . CYSTOSCOPY/URETEROSCOPY/HOLMIUM LASER/STENT PLACEMENT Left 01/06/2019   Procedure: CYSTOSCOPY/RETROGRADE/URETEROSCOPY/HOLMIUM LASER/BASKET RETRIEVAL/STENT PLACEMENT;  Surgeon: Amanda Mons, MD;  Location: [facility];  Service: Urology;  Laterality: Left;  . CYSTOSCOPY/URETEROSCOPY/HOLMIUM LASER/STENT PLACEMENT Bilateral 05/24/2020   Procedure: CYSTOSCOPY/RETROGRADE/URETEROSCOPY/HOLMIUM LASER/STENT PLACEMENT;  Surgeon: Amanda Mons, MD;  Location: [facility];  Service: Urology;  Laterality: Bilateral;  . EYE SURGERY Bilateral    cateracts  . falls     variious fall, broken wrist,and toes  . FRACTURE SURGERY Right April 2016   Wrist, Pt. fell  . IR NEPHROSTOMY PLACEMENT RIGHT  04/19/2020  . kidney stone removal Left 01/20/2019  . RIGHT/LEFT HEART CATH AND CORONARY ANGIOGRAPHY N/A 02/05/2018   Procedure: RIGHT/LEFT HEART CATH AND CORONARY ANGIOGRAPHY;  Surgeon: Amanda Sine, MD;  Location: [place] CV [facility];  Service: Cardiovascular;  Laterality: N/A;  . SPINAL CORD STIMULATOR INSERTION N/A 09/09/2015   Procedure: LUMBAR SPINAL CORD STIMULATOR INSERTION;  Surgeon: Amanda Hakim, MD;  Location: [place] NEURO ORS;  Service: Neurosurgery;  Laterality: N/A;  LUMBAR SPINAL CORD STIMULATOR INSERTION  . SPINE SURGERY  April 2013   Back X's 4  . TOTAL HIP ARTHROPLASTY     right  . WRIST FRACTURE SURGERY Bilateral     Current Medications: Current Meds  Medication Sig  . acetaminophen (TYLENOL) 500 MG tablet Take 500 mg by mouth daily as needed for mild pain. not to exceed 3 gm in 24 hours. As needed for arthritis pain  . acetaminophen (TYLENOL) 500 MG tablet Take 500 mg by mouth 2 (two) times daily.  Marland Kitchen  albuterol (PROVENTIL HFA;VENTOLIN HFA) 108 (90 Base) MCG/ACT inhaler Inhale 2 puffs into the lungs every 6 (six) hours as needed for wheezing or shortness of breath.  Marland Kitchen aspirin EC 81 MG tablet Take 81 mg by mouth daily.   . Calcium Carb-Cholecalciferol (CALCIUM 600/VITAMIN D3) 600-800 MG-UNIT TABS Take 1 tablet by mouth daily.  . carbidopa-levodopa (SINEMET IR) 25-100 MG tablet Take 2 tablets by mouth in the morning and at bedtime.   . Cholecalciferol 50 MCG (2000 UT) TABS Take 1 tablet by mouth daily.  . clotrimazole-betamethasone (LOTRISONE) cream Apply 1 application topically 2 (two) times daily.  Marland Kitchen docusate sodium (COLACE) 100 MG capsule Take 100 mg by mouth at bedtime.  Marland Kitchen ezetimibe (ZETIA) 10 MG tablet TAKE 1 TABLET ONCE DAILY.  . famotidine (PEPCID) 20 MG tablet Take 20 mg by mouth daily.  Marland Kitchen  Ferrous Sulfate (SLOW FE) 142 (45 Fe) MG TBCR Take 1 tablet by mouth. Daily  . furosemide (LASIX) 20 MG tablet Take 20 mg by mouth daily.  . hydrocortisone 2.5 % cream Apply 1 application topically 2 (two) times daily.  . hydroxychloroquine (PLAQUENIL) 200 MG tablet Take 200 mg by mouth daily.  . lansoprazole (PREVACID SOLUTAB) 30 MG disintegrating tablet Take 30 mg by mouth daily at 12 noon.  Marland Kitchen levothyroxine (SYNTHROID, LEVOTHROID) 50 MCG tablet Take 50 mcg by mouth daily before breakfast.   . lubiprostone (AMITIZA) 24 MCG capsule Take 24 mcg by mouth 2 (two) times daily with a meal.   . Multiple Vitamins-Minerals (MULTIVITAMIN WITH MINERALS) tablet Take 1 tablet by mouth daily.  . Multiple Vitamins-Minerals (PRESERVISION AREDS 2 PO) Take 1 tablet by mouth daily.  Marland Kitchen nystatin (MYCOSTATIN/NYSTOP) powder Apply topically 2 (two) times daily as needed.  Vladimir Faster Glycol-Propyl Glycol (SYSTANE) 0.4-0.3 % SOLN Apply 1 drop to eye 2 (two) times daily as needed (dry eyes).   . potassium chloride (KLOR-CON) 10 MEQ tablet Take 10 mEq by mouth daily.  . pramipexole (MIRAPEX) 1 MG tablet Take 1 mg by mouth 2  (two) times daily.  . QUEtiapine (SEROQUEL) 25 MG tablet Take 12.5 mg by mouth every morning. 1/2 tablet to = 12.5 mg QAM  . QUEtiapine (SEROQUEL) 25 MG tablet Take 25 mg by mouth daily.  . rosuvastatin (CRESTOR) 20 MG tablet Take 20 mg by mouth daily.  Marland Kitchen senna-docusate (SENOKOT-S) 8.6-50 MG tablet Take 1 tablet by mouth at bedtime.   . sertraline (ZOLOFT) 50 MG tablet Take 75 mg by mouth at bedtime. 1-1/2 tablets to = 75 mg  . Skin Protectants, Misc. (EUCERIN) cream Apply 1 application topically daily.   Marland Kitchen Spacer/Aero-Holding Chambers (AEROCHAMBER MV) inhaler by Other route. Use as instructed as needed     Allergies:   Adrenalone, Lactose, Morphine, Sulfa antibiotics, Codeine, Lactose intolerance (gi), Latex, Lyrica [pregabalin], Other, Plaquenil [hydroxychloroquine sulfate], Reglan [metoclopramide], Requip [ropinirole hcl], Septra [sulfamethoxazole-trimethoprim], and Shellfish allergy   Social History   Socioeconomic History  . Marital status: Married    Spouse name: [name]  . Number of children: 2  . Years of education: HS  . Highest education level: Not on file  Occupational History    Comment: Homemaker  Tobacco Use  . Smoking status: Never Smoker  . Smokeless tobacco: Never Used  Vaping Use  . Vaping Use: Never used  Substance and Sexual Activity  . Alcohol use: No    Alcohol/week: 0.0 standard drinks  . Drug use: No  . Sexual activity: Never  Other Topics Concern  . Not on file  Social History Narrative   Patient lives at home with her spouse. Married in 1957. Two people lives in the home, no pets.    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    Caffeine Use: tea   Social Determinants of Health   Financial Resource Strain:   . Difficulty of Paying Living Expenses:   Food Insecurity:   . Worried About Charity fundraiser in the Last Year:   . Arboriculturist in the Last Year:   Transportation Needs:   . Lexicographer (Medical):   Marland Kitchen Lack of Transportation (Non-Medical):   Physical Activity:   . Days of Exercise per Week:   . Minutes of Exercise per Session:   Stress:   . Feeling of Stress :  Social Connections:   . Frequency of Communication with Friends and Family:   . Frequency of Social Gatherings with Friends and Family:   . Attends Religious Services:   . Active Member of Clubs or Organizations:   . Attends Archivist Meetings:   Marland Kitchen Marital Status:      Family History: The patient's family history includes COPD in her father; Heart attack in her mother; Heart disease in her mother; Heart failure in her mother; Hypertension in her mother; Restless legs syndrome in her mother.  ROS:   Please see the history of present illness.    All other systems reviewed and are negative.  EKGs/Labs/Other Studies Reviewed:    The following studies were reviewed today: Echo 04-20-2020: IMPRESSIONS    1. Since the last study on 12/30/2019 aortic stenosis is now severe -  peak/mean transaortic gradients 66/41 mmHg and dimensionless index 0.21.  Referral for a TAVR procedure should be considered.  2. Left ventricular ejection fraction, by estimation, is 60 to 65%. The  left ventricle has normal function. The left ventricle has no regional  wall motion abnormalities. There is mild concentric left ventricular  hypertrophy. Left ventricular diastolic  parameters are consistent with Grade I diastolic dysfunction (impaired  relaxation). Elevated left atrial pressure.  3. Right ventricular systolic function is normal. The right ventricular  size is normal.  4. The mitral valve is normal in structure. Mild mitral valve  regurgitation. No evidence of mitral stenosis.  5. The aortic valve is normal in structure. Aortic valve regurgitation is  not visualized. Severe aortic valve stenosis. Aortic valve mean gradient  measures 41.0 mmHg.  6. The inferior vena cava is normal in size  with greater than 50%  respiratory variability, suggesting right atrial pressure of 3 mmHg.   FINDINGS  Left Ventricle: Left ventricular ejection fraction, by estimation, is 60  to 65%. The left ventricle has normal function. The left ventricle has no  regional wall motion abnormalities. The left ventricular internal cavity  size was normal in size. There is  mild concentric left ventricular hypertrophy. Left ventricular diastolic  parameters are consistent with Grade I diastolic dysfunction (impaired  relaxation). Elevated left atrial pressure.   Right Ventricle: The right ventricular size is normal. No increase in  right ventricular wall thickness. Right ventricular systolic function is  normal.   Left Atrium: Left atrial size was normal in size.   Right Atrium: Right atrial size was normal in size.   Pericardium: There is no evidence of pericardial effusion.   Mitral Valve: The mitral valve is normal in structure. There is mild  thickening of the mitral valve leaflet(s). There is mild calcification of  the mitral valve leaflet(s). Normal mobility of the mitral valve leaflets.  Moderate mitral annular  calcification. Mild mitral valve regurgitation. No evidence of mitral  valve stenosis.   Tricuspid Valve: The tricuspid valve is normal in structure. Tricuspid  valve regurgitation is mild . No evidence of tricuspid stenosis.   Aortic Valve: The aortic valve is normal in structure.. There is severe  thickening and severe calcifcation of the aortic valve. Aortic valve  regurgitation is not visualized. Severe aortic stenosis is present. There  is severe thickening of the aortic  valve. There is severe calcifcation of the aortic valve. Aortic valve mean  gradient measures 41.0 mmHg. Aortic valve peak gradient measures 65.3  mmHg. Aortic valve area, by VTI measures 0.85 cm.   Pulmonic Valve: The pulmonic valve was normal in  structure. Pulmonic valve  regurgitation is not  visualized. No evidence of pulmonic stenosis.   Aorta: The aortic root is normal in size and structure.   Venous: The inferior vena cava is normal in size with greater than 50%  respiratory variability, suggesting right atrial pressure of 3 mmHg.   IAS/Shunts: No atrial level shunt detected by color flow Doppler.     LEFT VENTRICLE  PLAX 2D  LVIDd:     3.00 cm Diastology  LVIDs:     2.50 cm LV e' lateral:  6.64 cm/s  LV PW:     1.10 cm LV E/e' lateral: 20.2  LV IVS:    1.10 cm LV e' medial:  4.90 cm/s  LVOT diam:   2.10 cm LV E/e' medial: 27.3  LV SV:     88  LV SV Index:  51  LVOT Area:   3.46 cm     RIGHT VENTRICLE  RV S prime:   6.96 cm/s  TAPSE (M-mode): 2.2 cm   LEFT ATRIUM       Index  LA diam:    3.80 cm 2.20 cm/m  LA Vol (A2C):  61.6 ml 35.70 ml/m  LA Vol (A4C):  76.9 ml 44.56 ml/m  LA Biplane Vol: 75.2 ml 43.58 ml/m  AORTIC VALVE  AV Area (Vmax):  0.72 cm  AV Area (Vmean):  0.73 cm  AV Area (VTI):   0.85 cm  AV Vmax:      404.00 cm/s  AV Vmean:     299.000 cm/s  AV VTI:      1.030 m  AV Peak Grad:   65.3 mmHg  AV Mean Grad:   41.0 mmHg  LVOT Vmax:     84.20 cm/s  LVOT Vmean:    63.400 cm/s  LVOT VTI:     0.254 m  LVOT/AV VTI ratio: 0.25    AORTA  Ao Root diam: 3.40 cm  Ao Asc diam: 3.10 cm   MITRAL VALVE  MV Area (PHT): 3.72 cm   SHUNTS  MV Decel Time: 204 msec   Systemic VTI: 0.25 m  MV E velocity: 134.00 cm/s Systemic Diam: 2.10 cm  MV A velocity: 131.00 cm/s  MV E/A ratio: 1.02   Cardiac Cath 02-05-2018: Conclusion    Prox LAD lesion is 35% stenosed.   Mild nonobstructive CAD with 30-40% proximal LAD stenosis before the first diagonal vessel; normal left circumflex and normal RCA.  Upper normal right heart pressures.  Mild-to-moderate aortic stenosis with a mean gradient of 14 mm and aortic valve area of 1.4 centimeters  squared.  RECOMMENDATION:  Medical therapy.  The patient will follow-up with Dr. Golden Hurter.  Diagnostic Dominance: Right Left Anterior Descending  Prox LAD lesion is 35% stenosed.  Intervention  No interventions have been documented. Right Heart  Right Heart Pressures RA;  A 9; V 7; mean 5 RV: 31/5 PA: 25/8; mean 16 PW: a 15' v 13; mean 10 AO: 145/61 LV: 157/18  Pullback: LV: 170/19 AO: 157/64  Oxygen saturation in the aorta 98% and in the pulmonary artery 68%.   By the Fick method, cardiac output was 5.5 L/m and cardiac index was 3.1 L/m/m.  Peak to peak aortic gradient was 13 with a mean gradient of 14.  Aortic valve area 1.4 cm.  Coronary Diagrams  Diagnostic Dominance: Right   EKG:  EKG is not ordered today.  The ekg from 05-12-2020 demonstrates NSR 96 bpm, RBBB  Recent Labs: 09/08/2019: TSH 1.62  04/23/2020: Magnesium 2.5 04/24/2020: ALT 7 05/24/2020: BUN 12; Creatinine, Ser 0.81; Hemoglobin 9.8; Platelets 286; Potassium 3.5; Sodium 141  Recent Lipid Panel    Component Value Date/Time   CHOL 118 07/14/2019 0811   TRIG 182 (H) 07/14/2019 0811   HDL 50 07/14/2019 0811   CHOLHDL 2.4 07/14/2019 0811   CHOLHDL 2.7 07/01/2018 0715   LDLCALC 32 07/14/2019 0811   LDLCALC 77 07/01/2018 0715    Physical Exam:    VS:  BP (!) 102/54   Pulse 84   Ht 5\' 2"  (1.575 m)   Wt 136 lb 6.4 oz (61.9 kg)   SpO2 99%   BMI 24.95 kg/m     Wt Readings from Last 3 Encounters:  06/23/20 136 lb 6.4 oz (61.9 kg)  06/20/20 140 lb 12.8 oz (63.9 kg)  06/08/20 139 lb 9.6 oz (63.3 kg)     GEN: Elderly, frail appearing woman, in no acute distress HEENT: Normal NECK: No JVD; BL carotid bruits LYMPHATICS: No lymphadenopathy CARDIAC: RRR, 3/6 harsh systolic murmur at the RUSB, no diastolic murmur RESPIRATORY:  Clear to auscultation without rales, wheezing or rhonchi  ABDOMEN: Soft, non-tender, non-distended MUSCULOSKELETAL:  No edema; No deformity  SKIN: Warm and  dry NEUROLOGIC:  Alert and oriented x 3 PSYCHIATRIC:  Normal affect   ASSESSMENT:    1. Severe aortic stenosis   2. Chronic diastolic CHF (congestive heart failure) (HCC)    PLAN:    In order of problems listed above:  1. The patient has severe, Stage D1 aortic stenosis with NYHA functional class III symptoms of chronic diastolic heart failure. Echo as outlined above with a severely calcified, restricted aortic valve with a mean gradient of 41 mmHg and LVOT to aortic valve ratio of 0.21.  LV function is preserved and there is no other significant valvular disease noted. I have reviewed the natural history of aortic stenosis with the patient and their family members who are present today. We have discussed the limitations of medical therapy and the poor prognosis associated with symptomatic aortic stenosis. We have reviewed potential treatment options, including palliative medical therapy, conventional surgical aortic valve replacement, and transcatheter aortic valve replacement. We discussed treatment options in the context of the patient's specific comorbid medical conditions. We reviewed the workup required to determine if she is a candidate for aortic valve replacement. She understands this would involve a R/L heart catheterization and CTA studies of the heart as well as the chest, abd, and pelvis. She understands that she would not be a candidate for conventional heart surgery. Her treatment options include palliative medical therapy or transcatheter aortic valve replacement. She is physically quite frail and has very limited functional capacity, nearly wheelchair bound at this point and able to walk short distances with a walker. The patient would like to discuss further with her family whether she would want to consider moving forward with TAVR evaluation. As part of her evaluation, she would require a formal physical therapy assessment. Cognitively she remains sharp and clearly understands the  pertinent issues around whether to proceed with TAVR evaluation or to approach her care in a palliative manner. She will also need to finish urologic treatment with removal of her ureteral stent before moving forward with further cardiac testing. I will schedule a follow-up visit in 4-6 and will plan on having her children present for the discussion if possible.    Medication Adjustments/Labs and Tests Ordered: Current medicines are reviewed at length with the patient today.  Concerns regarding medicines are outlined above.  Orders Placed This Encounter  Procedures  . EKG 12-Lead   No orders of the defined types were placed in this encounter.   Patient Instructions  You have a follow-up visit with Dr. Burt Knack on August 17, 2020 at 2:20PM.    Signed, Sherren Mocha, MD  06/25/2020 5:35 PM    [place]

## 2020-06-24 DIAGNOSIS — R29898 Other symptoms and signs involving the musculoskeletal system: Secondary | ICD-10-CM | POA: Diagnosis not present

## 2020-06-24 DIAGNOSIS — A4151 Sepsis due to Escherichia coli [E. coli]: Secondary | ICD-10-CM | POA: Diagnosis not present

## 2020-06-24 DIAGNOSIS — M154 Erosive (osteo)arthritis: Secondary | ICD-10-CM | POA: Diagnosis not present

## 2020-06-24 DIAGNOSIS — R278 Other lack of coordination: Secondary | ICD-10-CM | POA: Diagnosis not present

## 2020-06-24 DIAGNOSIS — M25511 Pain in right shoulder: Secondary | ICD-10-CM | POA: Diagnosis not present

## 2020-06-24 DIAGNOSIS — M6281 Muscle weakness (generalized): Secondary | ICD-10-CM | POA: Diagnosis not present

## 2020-06-25 ENCOUNTER — Encounter: Payer: Self-pay | Admitting: Cardiovascular Disease

## 2020-06-27 DIAGNOSIS — A4151 Sepsis due to Escherichia coli [E. coli]: Secondary | ICD-10-CM | POA: Diagnosis not present

## 2020-06-27 DIAGNOSIS — R29898 Other symptoms and signs involving the musculoskeletal system: Secondary | ICD-10-CM | POA: Diagnosis not present

## 2020-06-27 DIAGNOSIS — R278 Other lack of coordination: Secondary | ICD-10-CM | POA: Diagnosis not present

## 2020-06-27 DIAGNOSIS — M6281 Muscle weakness (generalized): Secondary | ICD-10-CM | POA: Diagnosis not present

## 2020-06-27 DIAGNOSIS — M25511 Pain in right shoulder: Secondary | ICD-10-CM | POA: Diagnosis not present

## 2020-06-27 DIAGNOSIS — M154 Erosive (osteo)arthritis: Secondary | ICD-10-CM | POA: Diagnosis not present

## 2020-06-27 NOTE — Assessment & Plan Note (Signed)
06/21/20 urine culture S Epidermidis 06/24/20 Nitrofurantoin 100mg  bid x 7 days.

## 2020-06-29 DIAGNOSIS — M6281 Muscle weakness (generalized): Secondary | ICD-10-CM | POA: Diagnosis not present

## 2020-06-29 DIAGNOSIS — R29898 Other symptoms and signs involving the musculoskeletal system: Secondary | ICD-10-CM | POA: Diagnosis not present

## 2020-06-29 DIAGNOSIS — M25511 Pain in right shoulder: Secondary | ICD-10-CM | POA: Diagnosis not present

## 2020-06-29 DIAGNOSIS — R278 Other lack of coordination: Secondary | ICD-10-CM | POA: Diagnosis not present

## 2020-06-29 DIAGNOSIS — M154 Erosive (osteo)arthritis: Secondary | ICD-10-CM | POA: Diagnosis not present

## 2020-06-29 DIAGNOSIS — A4151 Sepsis due to Escherichia coli [E. coli]: Secondary | ICD-10-CM | POA: Diagnosis not present

## 2020-06-30 DIAGNOSIS — D649 Anemia, unspecified: Secondary | ICD-10-CM | POA: Diagnosis not present

## 2020-06-30 LAB — CBC AND DIFFERENTIAL
HCT: 27 — AB (ref 36–46)
Hemoglobin: 8.7 — AB (ref 12.0–16.0)
Neutrophils Absolute: 6406
Platelets: 443 — AB (ref 150–399)
WBC: 9.1

## 2020-06-30 LAB — CBC: RBC: 2.92 — AB (ref 3.87–5.11)

## 2020-07-01 DIAGNOSIS — R29898 Other symptoms and signs involving the musculoskeletal system: Secondary | ICD-10-CM | POA: Diagnosis not present

## 2020-07-01 DIAGNOSIS — M6281 Muscle weakness (generalized): Secondary | ICD-10-CM | POA: Diagnosis not present

## 2020-07-01 DIAGNOSIS — M154 Erosive (osteo)arthritis: Secondary | ICD-10-CM | POA: Diagnosis not present

## 2020-07-01 DIAGNOSIS — A4151 Sepsis due to Escherichia coli [E. coli]: Secondary | ICD-10-CM | POA: Diagnosis not present

## 2020-07-01 DIAGNOSIS — M25511 Pain in right shoulder: Secondary | ICD-10-CM | POA: Diagnosis not present

## 2020-07-01 DIAGNOSIS — R278 Other lack of coordination: Secondary | ICD-10-CM | POA: Diagnosis not present

## 2020-07-04 DIAGNOSIS — A4151 Sepsis due to Escherichia coli [E. coli]: Secondary | ICD-10-CM | POA: Diagnosis not present

## 2020-07-04 DIAGNOSIS — R278 Other lack of coordination: Secondary | ICD-10-CM | POA: Diagnosis not present

## 2020-07-04 DIAGNOSIS — R29898 Other symptoms and signs involving the musculoskeletal system: Secondary | ICD-10-CM | POA: Diagnosis not present

## 2020-07-04 DIAGNOSIS — M6281 Muscle weakness (generalized): Secondary | ICD-10-CM | POA: Diagnosis not present

## 2020-07-04 DIAGNOSIS — M25511 Pain in right shoulder: Secondary | ICD-10-CM | POA: Diagnosis not present

## 2020-07-04 DIAGNOSIS — M154 Erosive (osteo)arthritis: Secondary | ICD-10-CM | POA: Diagnosis not present

## 2020-07-06 DIAGNOSIS — R29898 Other symptoms and signs involving the musculoskeletal system: Secondary | ICD-10-CM | POA: Diagnosis not present

## 2020-07-06 DIAGNOSIS — M154 Erosive (osteo)arthritis: Secondary | ICD-10-CM | POA: Diagnosis not present

## 2020-07-06 DIAGNOSIS — R278 Other lack of coordination: Secondary | ICD-10-CM | POA: Diagnosis not present

## 2020-07-06 DIAGNOSIS — M6281 Muscle weakness (generalized): Secondary | ICD-10-CM | POA: Diagnosis not present

## 2020-07-06 DIAGNOSIS — M25511 Pain in right shoulder: Secondary | ICD-10-CM | POA: Diagnosis not present

## 2020-07-06 DIAGNOSIS — A4151 Sepsis due to Escherichia coli [E. coli]: Secondary | ICD-10-CM | POA: Diagnosis not present

## 2020-07-07 DIAGNOSIS — N201 Calculus of ureter: Secondary | ICD-10-CM | POA: Diagnosis not present

## 2020-07-07 DIAGNOSIS — R300 Dysuria: Secondary | ICD-10-CM | POA: Diagnosis not present

## 2020-07-08 DIAGNOSIS — F060 Psychotic disorder with hallucinations due to known physiological condition: Secondary | ICD-10-CM | POA: Diagnosis not present

## 2020-07-08 DIAGNOSIS — M6281 Muscle weakness (generalized): Secondary | ICD-10-CM | POA: Diagnosis not present

## 2020-07-08 DIAGNOSIS — A4151 Sepsis due to Escherichia coli [E. coli]: Secondary | ICD-10-CM | POA: Diagnosis not present

## 2020-07-08 DIAGNOSIS — R29898 Other symptoms and signs involving the musculoskeletal system: Secondary | ICD-10-CM | POA: Diagnosis not present

## 2020-07-08 DIAGNOSIS — M25511 Pain in right shoulder: Secondary | ICD-10-CM | POA: Diagnosis not present

## 2020-07-08 DIAGNOSIS — R278 Other lack of coordination: Secondary | ICD-10-CM | POA: Diagnosis not present

## 2020-07-08 DIAGNOSIS — M154 Erosive (osteo)arthritis: Secondary | ICD-10-CM | POA: Diagnosis not present

## 2020-07-12 DIAGNOSIS — I5032 Chronic diastolic (congestive) heart failure: Secondary | ICD-10-CM | POA: Diagnosis not present

## 2020-07-12 DIAGNOSIS — I350 Nonrheumatic aortic (valve) stenosis: Secondary | ICD-10-CM | POA: Diagnosis not present

## 2020-07-12 DIAGNOSIS — E039 Hypothyroidism, unspecified: Secondary | ICD-10-CM | POA: Diagnosis not present

## 2020-07-12 DIAGNOSIS — K22719 Barrett's esophagus with dysplasia, unspecified: Secondary | ICD-10-CM | POA: Diagnosis not present

## 2020-07-12 DIAGNOSIS — F419 Anxiety disorder, unspecified: Secondary | ICD-10-CM | POA: Diagnosis not present

## 2020-07-12 DIAGNOSIS — F323 Major depressive disorder, single episode, severe with psychotic features: Secondary | ICD-10-CM | POA: Diagnosis not present

## 2020-07-12 DIAGNOSIS — R278 Other lack of coordination: Secondary | ICD-10-CM | POA: Diagnosis not present

## 2020-07-12 DIAGNOSIS — D500 Iron deficiency anemia secondary to blood loss (chronic): Secondary | ICD-10-CM | POA: Diagnosis not present

## 2020-07-12 DIAGNOSIS — R441 Visual hallucinations: Secondary | ICD-10-CM | POA: Diagnosis not present

## 2020-07-12 DIAGNOSIS — N390 Urinary tract infection, site not specified: Secondary | ICD-10-CM | POA: Diagnosis not present

## 2020-07-12 DIAGNOSIS — R279 Unspecified lack of coordination: Secondary | ICD-10-CM | POA: Diagnosis not present

## 2020-07-12 DIAGNOSIS — R2681 Unsteadiness on feet: Secondary | ICD-10-CM | POA: Diagnosis not present

## 2020-07-12 DIAGNOSIS — M154 Erosive (osteo)arthritis: Secondary | ICD-10-CM | POA: Diagnosis not present

## 2020-07-12 DIAGNOSIS — R0602 Shortness of breath: Secondary | ICD-10-CM | POA: Diagnosis not present

## 2020-07-12 DIAGNOSIS — Z299 Encounter for prophylactic measures, unspecified: Secondary | ICD-10-CM | POA: Diagnosis not present

## 2020-07-12 DIAGNOSIS — Z9181 History of falling: Secondary | ICD-10-CM | POA: Diagnosis not present

## 2020-07-12 DIAGNOSIS — R29898 Other symptoms and signs involving the musculoskeletal system: Secondary | ICD-10-CM | POA: Diagnosis not present

## 2020-07-12 DIAGNOSIS — G8929 Other chronic pain: Secondary | ICD-10-CM | POA: Diagnosis not present

## 2020-07-12 DIAGNOSIS — M25511 Pain in right shoulder: Secondary | ICD-10-CM | POA: Diagnosis not present

## 2020-07-12 DIAGNOSIS — K5909 Other constipation: Secondary | ICD-10-CM | POA: Diagnosis not present

## 2020-07-12 DIAGNOSIS — M6281 Muscle weakness (generalized): Secondary | ICD-10-CM | POA: Diagnosis not present

## 2020-07-13 DIAGNOSIS — R29898 Other symptoms and signs involving the musculoskeletal system: Secondary | ICD-10-CM | POA: Diagnosis not present

## 2020-07-13 DIAGNOSIS — R2681 Unsteadiness on feet: Secondary | ICD-10-CM | POA: Diagnosis not present

## 2020-07-13 DIAGNOSIS — M25511 Pain in right shoulder: Secondary | ICD-10-CM | POA: Diagnosis not present

## 2020-07-13 DIAGNOSIS — M6281 Muscle weakness (generalized): Secondary | ICD-10-CM | POA: Diagnosis not present

## 2020-07-13 DIAGNOSIS — Z9181 History of falling: Secondary | ICD-10-CM | POA: Diagnosis not present

## 2020-07-13 DIAGNOSIS — R278 Other lack of coordination: Secondary | ICD-10-CM | POA: Diagnosis not present

## 2020-07-15 DIAGNOSIS — R278 Other lack of coordination: Secondary | ICD-10-CM | POA: Diagnosis not present

## 2020-07-15 DIAGNOSIS — R2681 Unsteadiness on feet: Secondary | ICD-10-CM | POA: Diagnosis not present

## 2020-07-15 DIAGNOSIS — R29898 Other symptoms and signs involving the musculoskeletal system: Secondary | ICD-10-CM | POA: Diagnosis not present

## 2020-07-15 DIAGNOSIS — M25511 Pain in right shoulder: Secondary | ICD-10-CM | POA: Diagnosis not present

## 2020-07-15 DIAGNOSIS — Z9181 History of falling: Secondary | ICD-10-CM | POA: Diagnosis not present

## 2020-07-15 DIAGNOSIS — M6281 Muscle weakness (generalized): Secondary | ICD-10-CM | POA: Diagnosis not present

## 2020-07-18 DIAGNOSIS — M25511 Pain in right shoulder: Secondary | ICD-10-CM | POA: Diagnosis not present

## 2020-07-18 DIAGNOSIS — R278 Other lack of coordination: Secondary | ICD-10-CM | POA: Diagnosis not present

## 2020-07-18 DIAGNOSIS — R2681 Unsteadiness on feet: Secondary | ICD-10-CM | POA: Diagnosis not present

## 2020-07-18 DIAGNOSIS — Z9181 History of falling: Secondary | ICD-10-CM | POA: Diagnosis not present

## 2020-07-18 DIAGNOSIS — R29898 Other symptoms and signs involving the musculoskeletal system: Secondary | ICD-10-CM | POA: Diagnosis not present

## 2020-07-18 DIAGNOSIS — M6281 Muscle weakness (generalized): Secondary | ICD-10-CM | POA: Diagnosis not present

## 2020-07-20 DIAGNOSIS — R278 Other lack of coordination: Secondary | ICD-10-CM | POA: Diagnosis not present

## 2020-07-20 DIAGNOSIS — M25511 Pain in right shoulder: Secondary | ICD-10-CM | POA: Diagnosis not present

## 2020-07-20 DIAGNOSIS — M6281 Muscle weakness (generalized): Secondary | ICD-10-CM | POA: Diagnosis not present

## 2020-07-20 DIAGNOSIS — R2681 Unsteadiness on feet: Secondary | ICD-10-CM | POA: Diagnosis not present

## 2020-07-20 DIAGNOSIS — R29898 Other symptoms and signs involving the musculoskeletal system: Secondary | ICD-10-CM | POA: Diagnosis not present

## 2020-07-20 DIAGNOSIS — Z9181 History of falling: Secondary | ICD-10-CM | POA: Diagnosis not present

## 2020-07-22 DIAGNOSIS — R2681 Unsteadiness on feet: Secondary | ICD-10-CM | POA: Diagnosis not present

## 2020-07-22 DIAGNOSIS — R29898 Other symptoms and signs involving the musculoskeletal system: Secondary | ICD-10-CM | POA: Diagnosis not present

## 2020-07-22 DIAGNOSIS — M25511 Pain in right shoulder: Secondary | ICD-10-CM | POA: Diagnosis not present

## 2020-07-22 DIAGNOSIS — Z9181 History of falling: Secondary | ICD-10-CM | POA: Diagnosis not present

## 2020-07-22 DIAGNOSIS — R278 Other lack of coordination: Secondary | ICD-10-CM | POA: Diagnosis not present

## 2020-07-22 DIAGNOSIS — M6281 Muscle weakness (generalized): Secondary | ICD-10-CM | POA: Diagnosis not present

## 2020-07-24 DIAGNOSIS — R29898 Other symptoms and signs involving the musculoskeletal system: Secondary | ICD-10-CM | POA: Diagnosis not present

## 2020-07-24 DIAGNOSIS — R278 Other lack of coordination: Secondary | ICD-10-CM | POA: Diagnosis not present

## 2020-07-24 DIAGNOSIS — M25511 Pain in right shoulder: Secondary | ICD-10-CM | POA: Diagnosis not present

## 2020-07-24 DIAGNOSIS — M6281 Muscle weakness (generalized): Secondary | ICD-10-CM | POA: Diagnosis not present

## 2020-07-24 DIAGNOSIS — Z9181 History of falling: Secondary | ICD-10-CM | POA: Diagnosis not present

## 2020-07-24 DIAGNOSIS — R2681 Unsteadiness on feet: Secondary | ICD-10-CM | POA: Diagnosis not present

## 2020-07-25 DIAGNOSIS — R2681 Unsteadiness on feet: Secondary | ICD-10-CM | POA: Diagnosis not present

## 2020-07-25 DIAGNOSIS — Z9181 History of falling: Secondary | ICD-10-CM | POA: Diagnosis not present

## 2020-07-25 DIAGNOSIS — R278 Other lack of coordination: Secondary | ICD-10-CM | POA: Diagnosis not present

## 2020-07-25 DIAGNOSIS — M6281 Muscle weakness (generalized): Secondary | ICD-10-CM | POA: Diagnosis not present

## 2020-07-25 DIAGNOSIS — M25511 Pain in right shoulder: Secondary | ICD-10-CM | POA: Diagnosis not present

## 2020-07-25 DIAGNOSIS — R29898 Other symptoms and signs involving the musculoskeletal system: Secondary | ICD-10-CM | POA: Diagnosis not present

## 2020-07-26 DIAGNOSIS — R29898 Other symptoms and signs involving the musculoskeletal system: Secondary | ICD-10-CM | POA: Diagnosis not present

## 2020-07-26 DIAGNOSIS — R2681 Unsteadiness on feet: Secondary | ICD-10-CM | POA: Diagnosis not present

## 2020-07-26 DIAGNOSIS — N390 Urinary tract infection, site not specified: Secondary | ICD-10-CM | POA: Diagnosis not present

## 2020-07-26 DIAGNOSIS — M6281 Muscle weakness (generalized): Secondary | ICD-10-CM | POA: Diagnosis not present

## 2020-07-26 DIAGNOSIS — M25511 Pain in right shoulder: Secondary | ICD-10-CM | POA: Diagnosis not present

## 2020-07-26 DIAGNOSIS — Z9181 History of falling: Secondary | ICD-10-CM | POA: Diagnosis not present

## 2020-07-26 DIAGNOSIS — R278 Other lack of coordination: Secondary | ICD-10-CM | POA: Diagnosis not present

## 2020-07-27 ENCOUNTER — Encounter: Payer: Self-pay | Admitting: Nurse Practitioner

## 2020-07-27 ENCOUNTER — Non-Acute Institutional Stay (SKILLED_NURSING_FACILITY): Payer: Medicare Other | Admitting: Nurse Practitioner

## 2020-07-27 DIAGNOSIS — I350 Nonrheumatic aortic (valve) stenosis: Secondary | ICD-10-CM | POA: Diagnosis not present

## 2020-07-27 DIAGNOSIS — R300 Dysuria: Secondary | ICD-10-CM | POA: Diagnosis not present

## 2020-07-27 DIAGNOSIS — R413 Other amnesia: Secondary | ICD-10-CM

## 2020-07-27 DIAGNOSIS — F323 Major depressive disorder, single episode, severe with psychotic features: Secondary | ICD-10-CM | POA: Diagnosis not present

## 2020-07-27 DIAGNOSIS — D500 Iron deficiency anemia secondary to blood loss (chronic): Secondary | ICD-10-CM

## 2020-07-27 DIAGNOSIS — E02 Subclinical iodine-deficiency hypothyroidism: Secondary | ICD-10-CM | POA: Diagnosis not present

## 2020-07-27 DIAGNOSIS — S0003XA Contusion of scalp, initial encounter: Secondary | ICD-10-CM | POA: Diagnosis not present

## 2020-07-27 DIAGNOSIS — R2681 Unsteadiness on feet: Secondary | ICD-10-CM | POA: Diagnosis not present

## 2020-07-27 DIAGNOSIS — K219 Gastro-esophageal reflux disease without esophagitis: Secondary | ICD-10-CM | POA: Diagnosis not present

## 2020-07-27 DIAGNOSIS — W19XXXA Unspecified fall, initial encounter: Secondary | ICD-10-CM

## 2020-07-27 DIAGNOSIS — I5032 Chronic diastolic (congestive) heart failure: Secondary | ICD-10-CM | POA: Diagnosis not present

## 2020-07-27 DIAGNOSIS — K5909 Other constipation: Secondary | ICD-10-CM

## 2020-07-27 DIAGNOSIS — M154 Erosive (osteo)arthritis: Secondary | ICD-10-CM

## 2020-07-27 DIAGNOSIS — G20 Parkinson's disease: Secondary | ICD-10-CM

## 2020-07-27 NOTE — Assessment & Plan Note (Signed)
Stable, continue Amitiza, Colace, Senokot S

## 2020-07-27 NOTE — Assessment & Plan Note (Signed)
Her mood is stable, continue Sertraline 75mg  qd, Quetiapine 25mg  qd, 12.5mg  qd

## 2020-07-27 NOTE — Assessment & Plan Note (Signed)
The patient needs reminders, supervision for safety, progression of memory lapses noted.

## 2020-07-27 NOTE — Assessment & Plan Note (Signed)
/  trace edema BLE, takes Furosemide 20mg  qd, cardiology, severe aortic valve stenosis.

## 2020-07-27 NOTE — Progress Notes (Signed)
Location:   [facility] Room Number: 45Room Number  45 Place of Service:  SNF (31)SNF Provider: Lennie Odor Khamari Sheehan NP  [name], MD  Patient Care Team: [name], MD as PCP - General (Internal Medicine) [name], MD as PCP - Cardiology (Cardiology) [name], MD (Neurosurgery) [name], MD (Neurology) [name], MD (Inactive) as Consulting Physician (Gastroenterology) [name], MD as Consulting Physician (Urology) Francine Hannan X, NP as Nurse Practitioner (Internal Medicine)  Extended Emergency Contact Information Primary Emergency Contact: [facility] Address: [address]          [place], [place] [address] [name] of [place] Phone: [phone] Mobile Phone: [phone] Relation: Spouse Secondary Emergency Contact: [name] Mobile Phone: [phone] Relation: Daughter Preferred language: [name] Interpreter needed? No  Code Status: DNR Goals of care: Advanced Directive information Advanced Directives 07/27/2020  Does Patient Have a Medical Advance Directive? Yes  Type of Advance Directive Out of facility DNR (pink MOST or yellow form)  Does patient want to make changes to medical advance directive? No - Patient declined  Copy of [place] in Chart? -  Would patient like information on creating a medical advance directive? -  Pre-existing out of facility DNR order (yellow form or pink MOST form) Yellow form placed in chart (order not valid for inpatient use)     Chief Complaint  Patient presents with  . Acute Visit    Burning sensation upon urination, fall    HPI:  Pt is a 86 y.o. female seen today for an acute visit for s/p fall when the patient lost her balance while walking in her room without calling for assistance, resulted a marble sized hematoma in her occiput, abrasion in mid spine, no new pain in med back or focal weakness noted. The patient has  unsteady gait, she needs SBA when walking with walker.    Dysuria, excruciating pain upon urination, stops when urine steam ends, denied blood in urine, pending UA C/S, f/u Urology, recently ureter stents/percutanous nephrostomy  removed. Hx of rena lithiasis, recurrent UTI, urosepsis  GERD, takes Lansoprazole 30mg  qd, Famotidine 20mg  qd.  Parkinson's, takes MiraPex 1mg  bid, Sinemet 25/100 II bid.  Depression, takes Sertraline 75mg  qd, Quetiapine 25mg  qd, 12.5mg  qd Constipation, takes Senokot S I qd, Amitiza 60mcg qd, Colace qd Hypothyroidism, takes Levothyroxine 44mcg qd.  Erosive arthritis, takes Plaquenil 200mg  qd, Tylenol 500mg  bid.  CHF/trace edema BLE, takes Furosemide 20mg  qd, cardiology, severe aortic valve stenosis.  Anemia, takes Fe      Past Medical History:  Diagnosis Date  . Abnormal nuclear stress test   . Actinic cheilitis 04/23/2019  . Anemia    iron deficiency   . Anxiety   . Aortic stenosis    severe by echo 2021   . Arthritis   . Broken arm    right   . Chronic diastolic CHF (congestive heart failure) ([place])   . Chronic pain   . Cognitive communication deficit   . Constipation   . Depression   . Erosive (osteo)arthritis 03/09/2020  . Family history of adverse reaction to anesthesia    pt. states sister vomits  . Fibromyalgia   . GERD (gastroesophageal reflux disease)   . H/O hiatal hernia   . Hallucinations, visual 07/21/2018   03/10/20 psych consult.   . Hematuria 05/18/2020  . History of bronchitis   . History of kidney stones   . Hypercholesterolemia   . Hypertension  dr t turner  . Hypothyroidism   . Memory loss 04/21/2018   08/08/18 CT head no traumatic findings, atrophy.  09/27/19 MMSE 25/30, failed the clock drawing  . Microhematuria 03/24/2015  . Neuropathy   . Osteoporosis   . Parkinson disease ([place])   . PVD (peripheral vascular disease) (H[facility])    99% stenosis of  left anteiror tibial artery, mod stenosis of left distal SFA and popliteal artery followed by [name]  . RBBB    noted on EKG 2018  . Restless leg syndrome   . Sepsis ([place])    hx of due to [facility]  . Septic shock ([place]) 04/19/2020  . Shortness of breath    with exertion - chronic   . Sjogren's disease ([place])   . Sjogren's syndrome [facility]) 02/04/2018   04/16/19 rheumatology: Erosive OA of hands, Sjogrens syndrome, low back pain at multiple sites, recurrent kidney stones, age related osteoporosis w/o current pathological fracture, f/u 6 months.   . SOB (shortness of breath)    chronic due to diastolic dysfunction, deconditioning, obesity  . Spinal stenosis    lumbar region   . Tremor   . Unstable gait 04/21/2018  . Unsteadiness on feet   . Urinary frequency 09/18/2018  . Urinary tract infection   . Visual hallucinations    Past Surgical History:  Procedure Laterality Date  . ABDOMINAL HYSTERECTOMY    . BACK SURGERY     4 back surgeries,   lumbar fusion  . CARDIAC CATHETERIZATION  2006   normal  . CYSTOSCOPY/URETEROSCOPY/HOLMIUM LASER/STENT PLACEMENT Left 01/06/2019   Procedure: CYSTOSCOPY/RETROGRADE/URETEROSCOPY/HOLMIUM LASER/BASKET RETRIEVAL/STENT PLACEMENT;  Surgeon: Ceasar Mons, MD;  Location: [facility];  Service: Urology;  Laterality: Left;  . CYSTOSCOPY/URETEROSCOPY/HOLMIUM LASER/STENT PLACEMENT Bilateral 05/24/2020   Procedure: CYSTOSCOPY/RETROGRADE/URETEROSCOPY/HOLMIUM LASER/STENT PLACEMENT;  Surgeon: Ceasar Mons, MD;  Location: [facility];  Service: Urology;  Laterality: Bilateral;  . EYE SURGERY Bilateral    cateracts  . falls     variious fall, broken wrist,and toes  . FRACTURE SURGERY Right April 2016   Wrist, Pt. fell  . IR NEPHROSTOMY PLACEMENT RIGHT  04/19/2020  . kidney stone removal Left 01/20/2019  . RIGHT/LEFT HEART CATH AND CORONARY ANGIOGRAPHY N/A 02/05/2018   Procedure: RIGHT/LEFT HEART CATH AND CORONARY ANGIOGRAPHY;  Surgeon: Troy Sine,  MD;  Location: [facility] CV LAB;  Service: Cardiovascular;  Laterality: N/A;  . SPINAL CORD STIMULATOR INSERTION N/A 09/09/2015   Procedure: LUMBAR SPINAL CORD STIMULATOR INSERTION;  Surgeon: Clydell Hakim, MD;  Location: [facility] OR[facility];  Service: Neurosurgery;  Laterality: N/A;  LUMBAR SPINAL CORD STIMULATOR INSERTION  . SPINE SURGERY  April 2013   Back X's 4  . TOTAL HIP ARTHROPLASTY     right  . WRIST FRACTURE SURGERY Bilateral     Allergies  Allergen Reactions  . Adrenalone   . Lactose Other (See Comments)    abd pain, lactose intolerant  . Morphine Other (See Comments)  . Sulfa Antibiotics Other (See Comments)    Headache, very sick  . Codeine Other (See Comments)    Reaction:  Headaches and nightmares   . Lactose Intolerance (Gi) Nausea And Vomiting  . Latex Rash  . Lyrica [Pregabalin] Swelling and Other (See Comments)    Reaction:  Leg swelling  . Other Other (See Comments)    Pt states that pain medications give her nightmares.    . Plaquenil [Hydroxychloroquine Sulfate] Other (See Comments)    Reaction:  GI upset   . Reglan [Metoclopramide]  Other (See Comments)    Reaction:  GI upset   . Requip [Ropinirole Hcl] Other (See Comments)    Reaction:  GI upset   . Septra [Sulfamethoxazole-Trimethoprim] Nausea And Vomiting  . Shellfish Allergy Nausea And Vomiting    Allergies as of 07/27/2020      Reactions   Adrenalone    Lactose Other (See Comments)   abd pain, lactose intolerant   Morphine Other (See Comments)   Sulfa Antibiotics Other (See Comments)   Headache, very sick   Codeine Other (See Comments)   Reaction:  Headaches and nightmares    Lactose Intolerance (gi) Nausea And Vomiting   Latex Rash   Lyrica [pregabalin] Swelling, Other (See Comments)   Reaction:  Leg swelling   Other Other (See Comments)   Pt states that pain medications give her nightmares.     Plaquenil [hydroxychloroquine Sulfate] Other (See Comments)   Reaction:  GI upset    Reglan  [metoclopramide] Other (See Comments)   Reaction:  GI upset    Requip [ropinirole Hcl] Other (See Comments)   Reaction:  GI upset    Septra [sulfamethoxazole-trimethoprim] Nausea And Vomiting   Shellfish Allergy Nausea And Vomiting      Medication List       Accurate as of July 27, 2020 11:59 PM. If you have any questions, ask your nurse or doctor.        STOP taking these medications   clotrimazole-betamethasone cream Commonly known as: Lotrisone Stopped by: Damiana Berrian X Jaimee Corum, NP   hydrocortisone 2.5 % cream Stopped by: Adelee Hannula X Zan Orlick, NP     TAKE these medications   acetaminophen 500 MG tablet Commonly known as: TYLENOL Take 500 mg by mouth daily as needed for mild pain. not to exceed 3 gm in 24 hours. As needed for arthritis pain   TYLENOL 500 MG tablet Generic drug: acetaminophen Take 500 mg by mouth 2 (two) times daily.   AeroChamber MV inhaler by Other route. Use as instructed as needed   albuterol 108 (90 Base) MCG/ACT inhaler Commonly known as: VENTOLIN HFA Inhale 2 puffs into the lungs every 6 (six) hours as needed for wheezing or shortness of breath.   aspirin EC 81 MG tablet Take 81 mg by mouth daily.   Calcium 600/Vitamin D3 600-800 MG-UNIT Tabs Generic drug: Calcium Carb-Cholecalciferol Take 1 tablet by mouth daily.   carbidopa-levodopa 25-100 MG tablet Commonly known as: SINEMET IR Take 2 tablets by mouth in the morning and at bedtime.   Cholecalciferol 50 MCG (2000 UT) Tabs Take 1 tablet by mouth daily.   docusate sodium 100 MG capsule Commonly known as: COLACE Take 100 mg by mouth at bedtime.   eucerin cream Apply 1 application topically daily.   ezetimibe 10 MG tablet Commonly known as: ZETIA TAKE 1 TABLET ONCE DAILY.   famotidine 20 MG tablet Commonly known as: PEPCID Take 20 mg by mouth daily.   furosemide 20 MG tablet Commonly known as: LASIX Take 20 mg by mouth daily.   hydroxychloroquine 200 MG tablet Commonly known as:  PLAQUENIL Take 200 mg by mouth daily.   lansoprazole 30 MG disintegrating tablet Commonly known as: PREVACID SOLUTAB Take 30 mg by mouth daily at 12 noon.   levothyroxine 50 MCG tablet Commonly known as: SYNTHROID Take 50 mcg by mouth daily before breakfast.   lubiprostone 24 MCG capsule Commonly known as: AMITIZA Take 24 mcg by mouth 2 (two) times daily with a meal.   multivitamin with minerals  tablet Take 1 tablet by mouth daily.   PRESERVISION AREDS 2 PO Take 1 tablet by mouth daily.   nystatin powder Commonly known as: MYCOSTATIN/NYSTOP Apply topically 2 (two) times daily as needed.   potassium chloride 10 MEQ tablet Commonly known as: KLOR-CON Take 10 mEq by mouth daily.   pramipexole 1 MG tablet Commonly known as: MIRAPEX Take 1 mg by mouth 2 (two) times daily.   QUEtiapine 25 MG tablet Commonly known as: SEROQUEL Take 12.5 mg by mouth every morning. 1/2 tablet to = 12.5 mg QAM   QUEtiapine 25 MG tablet Commonly known as: SEROQUEL Take 25 mg by mouth daily.   rosuvastatin 20 MG tablet Commonly known as: CRESTOR Take 20 mg by mouth daily.   senna-docusate 8.6-50 MG tablet Commonly known as: Senokot-S Take 1 tablet by mouth at bedtime.   sertraline 50 MG tablet Commonly known as: ZOLOFT Take 75 mg by mouth at bedtime. 1-1/2 tablets to = 75 mg   Slow Fe 142 (45 Fe) MG Tbcr Generic drug: Ferrous Sulfate Take 1 tablet by mouth. Daily   Systane 0.4-0.3 % Soln Generic drug: Polyethyl Glycol-Propyl Glycol Apply 1 drop to eye 2 (two) times daily as needed (dry eyes).       Review of Systems  Constitutional: Negative for activity change, appetite change and fever.  HENT: Positive for hearing loss. Negative for congestion and voice change.   Eyes: Negative for visual disturbance.  Respiratory: Positive for shortness of breath. Negative for cough.        Chronic DOE  Cardiovascular: Positive for leg swelling.  Gastrointestinal: Negative for abdominal  pain, constipation, nausea and vomiting.  Genitourinary: Positive for dysuria. Negative for flank pain, hematuria and urgency.  Musculoskeletal: Positive for arthralgias, back pain, gait problem and myalgias.       Chronic lower back pain.   Skin: Negative for color change.  Neurological: Negative for dizziness, facial asymmetry, speech difficulty, weakness and headaches.       Memory lapses.   Psychiatric/Behavioral: Positive for hallucinations. Negative for behavioral problems and sleep disturbance. The patient is not nervous/anxious.        On and off visual hallucination    Immunization History  Administered Date(s) Administered  . Influenza Split 09/09/2013  . Influenza, High Dose Seasonal PF 09/12/2018, 09/15/2019  . Influenza,inj,Quad PF,6+ Mos 09/11/2016  . Moderna SARS-COVID-2 Vaccination 12/12/2019, 01/09/2020  . Pneumococcal Conjugate-13 04/15/2014  . Pneumococcal Polysaccharide-23 09/28/2005  . Tdap 09/01/2015  . Zoster Recombinat (Shingrix) 06/11/2018, 09/10/2018   Pertinent  Health Maintenance Due  Topic Date Due  . INFLUENZA VACCINE  07/10/2020  . DEXA SCAN  Completed  . PNA vac Low Risk Adult  Completed   Fall Risk  11/16/2019 04/23/2019 04/09/2019 10/29/2018 10/27/2018  Falls in the past year? 0 0 0 0 1  Comment - - - fell June 2019 -  Number falls in past yr: - 0 0 - 0  Injury with Fall? - 0 0 - 1  Comment - - - - -  Risk for fall due to : - - - - -  Risk for fall due to: Comment - - - - -   Functional Status Survey:    Vitals:   07/27/20 1549  BP: 118/64  Pulse: 84  Resp: 18  Temp: 98.1 F (36.7 C)  SpO2: 97%  Weight: 133 lb 11.2 oz (60.6 kg)  Height: 5\' 2"  (1.575 m)   Body mass index is 24.45 kg/m. Physical Exam Vitals  and nursing note reviewed.  Constitutional:      Appearance: Normal appearance.  HENT:     Head: Normocephalic and atraumatic.     Mouth/Throat:     Mouth: Mucous membranes are moist.  Eyes:     Extraocular Movements:  Extraocular movements intact.     Conjunctiva/sclera: Conjunctivae normal.     Pupils: Pupils are equal, round, and reactive to light.  Cardiovascular:     Rate and Rhythm: Normal rate and regular rhythm.     Heart sounds: Murmur heard.   Pulmonary:     Breath sounds: No rhonchi or rales.  Abdominal:     General: Bowel sounds are normal.     Palpations: Abdomen is soft.     Tenderness: There is no abdominal tenderness. There is no right CVA tenderness, left CVA tenderness, guarding or rebound.     Hernia: No hernia is present.  Musculoskeletal:     Cervical back: Normal range of motion and neck supple.     Right lower leg: Edema present.     Left lower leg: Edema present.     Comments: Trace to 1+ edema BLE.  Arthritic changes in fingers, R>L  Skin:    General: Skin is warm and dry.     Comments: A marble sized hematoma occiput, abrasion mid thoracic spine.   Neurological:     General: No focal deficit present.     Mental Status: She is alert. Mental status is at baseline.     Gait: Gait abnormal.     Comments: Oriented to person, place.   Psychiatric:        Mood and Affect: Mood normal.        Behavior: Behavior normal.     Labs reviewed: Recent Labs    04/21/20 0325 04/22/20 0605 04/22/20 0903 04/23/20 0350 04/23/20 0350 04/24/20 0423 04/25/20 0427 05/24/20 1015  NA 143  --    < > 144   < > 145 146* 141  K 3.4*  --    < > 3.1*   < > 3.0* 3.8 3.5  CL 115*  --    < > 119*   < > 115* 115* 107  CO2 20*  --    < > 21*   < > 22 26 24   GLUCOSE 129*  --    < > 80   < > 85 88 100*  BUN 35*  --    < > 33*   < > 26* 16 12  CREATININE 1.23*  --    < > 1.01*   < > 0.78 0.81 0.81  CALCIUM 7.2*  --    < > 7.2*   < > 7.0* 7.6* 8.9  MG 3.0* 2.6*  --  2.5*  --   --   --   --   PHOS 3.2 2.6  --  2.3*  --   --   --   --    < > = values in this interval not displayed.   Recent Labs    04/20/20 0455 04/21/20 0325 04/24/20 0423  AST 117* 50* 32  ALT 18 8 7   ALKPHOS 95 95 77   BILITOT 0.5 0.5 0.6  PROT 5.1* 5.4* 4.7*  ALBUMIN 2.3* 2.3* 2.3*   Recent Labs    04/15/20 0000 04/24/20 0423 04/24/20 0423 04/25/20 0427 04/25/20 0427 04/26/20 0000 04/26/20 0000 05/24/20 1015 06/21/20 0000 06/23/20 0000 06/30/20 0000  WBC   < > 14.5*   < >  13.8*   < > 10.7   < > 7.8 7.6 7.5 9.1  NEUTROABS  --   --   --   --   --  7,447  --   --  5,236  --  6,406  HGB   < > 8.8*   < > 9.4*   < > 9.2*   < > 9.8* 8.9* 8.8* 8.7*  HCT   < > 27.1*   < > 29.7*   < > 28*   < > 30.9* 28* 27* 27*  MCV  --  97.5  --  96.7  --   --   --  98.4  --   --   --   PLT   < > 194   < > 226   < > 245   < > 286 314 325 443*   < > = values in this interval not displayed.   Lab Results  Component Value Date   TSH 1.62 09/08/2019   No results found for: HGBA1C Lab Results  Component Value Date   CHOL 118 07/14/2019   HDL 50 07/14/2019   LDLCALC 32 07/14/2019   TRIG 182 (H) 07/14/2019   CHOLHDL 2.4 07/14/2019    Significant Diagnostic Results in last 30 days:  No results found.  Assessment/Plan: Fall  s/p fall when the patient lost her balance while walking in her room without calling for assistance, resulted a marble sized hematoma in her occiput, abrasion in mid spine, no new pain in med back or focal weakness noted. The patient has unsteady gait, she needs SBA when walking with walker.     Scalp hematoma, initial encounter  s/p fall when the patient lost her balance while walking in her room without calling for assistance, resulted a marble sized hematoma in her occiput, abrasion in mid spine, no new pain in med back or focal weakness noted. The patient has unsteady gait, she needs SBA when walking with walker.     Unstable gait Continue w/c for mobility, needs SBA when ambulates with walker.   Dysuria Dysuria, excruciating pain upon urination, stops when urine steam ends, denied blood in urine, pending UA C/S, f/u Urology, recently ureter stents removed. Hx of rena lithiasis,  recurrent UTI, urosepsis   GERD (gastroesophageal reflux disease) Stable, continue Lansoprazole 30mg  qd, Famotidine 20mg  qd.   Parkinson's disease takes MiraPex 1mg  bid, Sinemet 25/100 II bid.    Depression, psychotic ([place]) Her mood is stable, continue Sertraline 75mg  qd, Quetiapine 25mg  qd, 12.5mg  qd   Memory loss The patient needs reminders, supervision for safety, progression of memory lapses noted.   Chronic constipation Stable, continue Amitiza, Colace, Senokot S  Hypothyroidism Stable, continue Levothyroxine, TSH wnl 08/2019  Aortic stenosis Pending decision for surgery  Chronic diastolic CHF (congestive heart failure) (HCC) /trace edema BLE, takes Furosemide 20mg  qd, cardiology, severe aortic valve stenosis.    Erosive (osteo)arthritis Stable, pain is controlled, continue Plaquenil, Tylenol.   Iron deficiency anemia Hgb 8.9 72221, continue Fe    Family/ staff Communication: plan of care reviewed with the patient and charge nurse.   Labs/tests ordered: pending UA C/S  Time spend 25 minutes.

## 2020-07-27 NOTE — Assessment & Plan Note (Signed)
Continue w/c for mobility, needs SBA when ambulates with walker.

## 2020-07-27 NOTE — Assessment & Plan Note (Signed)
takes MiraPex 1mg bid, Sinemet 25/100 II bid. 

## 2020-07-27 NOTE — Assessment & Plan Note (Signed)
Dysuria, excruciating pain upon urination, stops when urine steam ends, denied blood in urine, pending UA C/S, f/u Urology, recently ureter stents removed. Hx of rena lithiasis, recurrent UTI, urosepsis

## 2020-07-27 NOTE — Assessment & Plan Note (Signed)
Stable, continue Levothyroxine, TSH wnl 08/2019

## 2020-07-27 NOTE — Assessment & Plan Note (Signed)
Hgb 8.9 72221, continue Fe

## 2020-07-27 NOTE — Assessment & Plan Note (Signed)
Stable, continue Lansoprazole 30mg  qd, Famotidine 20mg  qd.

## 2020-07-27 NOTE — Assessment & Plan Note (Signed)
s/p fall when the patient lost her balance while walking in her room without calling for assistance, resulted a marble sized hematoma in her occiput, abrasion in mid spine, no new pain in med back or focal weakness noted. The patient has unsteady gait, she needs SBA when walking with walker.

## 2020-07-27 NOTE — Assessment & Plan Note (Signed)
Pending decision for surgery

## 2020-07-27 NOTE — Assessment & Plan Note (Signed)
Stable, pain is controlled, continue Plaquenil, Tylenol.

## 2020-07-28 ENCOUNTER — Encounter: Payer: Self-pay | Admitting: Nurse Practitioner

## 2020-07-29 DIAGNOSIS — N390 Urinary tract infection, site not specified: Secondary | ICD-10-CM | POA: Diagnosis not present

## 2020-08-02 ENCOUNTER — Encounter: Payer: Self-pay | Admitting: Internal Medicine

## 2020-08-02 ENCOUNTER — Non-Acute Institutional Stay (SKILLED_NURSING_FACILITY): Payer: Medicare Other | Admitting: Internal Medicine

## 2020-08-02 DIAGNOSIS — R441 Visual hallucinations: Secondary | ICD-10-CM | POA: Diagnosis not present

## 2020-08-02 DIAGNOSIS — I5032 Chronic diastolic (congestive) heart failure: Secondary | ICD-10-CM

## 2020-08-02 DIAGNOSIS — R300 Dysuria: Secondary | ICD-10-CM

## 2020-08-02 DIAGNOSIS — M154 Erosive (osteo)arthritis: Secondary | ICD-10-CM

## 2020-08-02 DIAGNOSIS — R2681 Unsteadiness on feet: Secondary | ICD-10-CM | POA: Diagnosis not present

## 2020-08-02 DIAGNOSIS — D500 Iron deficiency anemia secondary to blood loss (chronic): Secondary | ICD-10-CM | POA: Diagnosis not present

## 2020-08-02 DIAGNOSIS — G20 Parkinson's disease: Secondary | ICD-10-CM

## 2020-08-02 DIAGNOSIS — I350 Nonrheumatic aortic (valve) stenosis: Secondary | ICD-10-CM

## 2020-08-02 NOTE — Progress Notes (Signed)
Location:   [facility] Room Number: 45 Place of Service:  SNF [id number]) Provider:  [name] MD   [name], MD  Patient Care Team: [name], MD as PCP - General (Internal Medicine) [name], MD as PCP - Cardiology (Cardiology) [name], MD (Neurosurgery) [name], MD (Neurology) [name], MD (Inactive) as Consulting Physician (Gastroenterology) [name], MD as Consulting Physician (Urology) [name], Man X, NP as Nurse Practitioner (Internal Medicine)  Extended Emergency Contact Information Primary Emergency Contact: [facility] Address: [address]          [place], [place] [address] [name] of [place] Phone: [phone] Mobile Phone: [phone] Relation: Spouse Secondary Emergency Contact: [name] Mobile Phone: [phone] Relation: Daughter Preferred language: [name] Interpreter needed? No  Code Status:  Full Code Goals of care: Advanced Directive information Advanced Directives 07/27/2020  Does Patient Have a Medical Advance Directive? Yes  Type of Advance Directive Out of facility DNR (pink MOST or yellow form)  Does patient want to make changes to medical advance directive? No - Patient declined  Copy of [place] in Chart? -  Would patient like information on creating a medical advance directive? -  Pre-existing out of facility DNR order (yellow form or pink MOST form) Yellow form placed in chart (order not valid for inpatient use)     Chief Complaint  Patient presents with  . Acute Visit    Family meeting    HPI:  Pt is a 86 y.o. female seen today for an acute visit for Family Meeting with her Husband  Patient has history of Parkinson disease with visual hallucinations,Erosive Arthritis,peripheral neuropathy, hypothyroid, GERD, osteoporosis, hyperlipidemia, gout, dyspnea on exertionand Aortic stenosis  Has been wanted to talk  about her medical problems Her Active Problems Dysuria Patient has been having symptoms of dysuria for the past 2 weeks Though urine culture has been negative she has leukoesterase positive and more than 50 WBC in her urine No fever or chills no abdominal pain Patient had the lithotripsy and stent placement for her obstructing ureteral calculus in 6/21 Patient is not having hematuria anymore Severe AS Since then patient has also been diagnosed with severe aortic stenosis.  She is having symptoms of shortness of breath and general weakness.  She has met with Dr. Burt Knack from cardiology to discuss her options Parkinson disease with hallucinations Patient continues to have some hallucinations mostly once every few weeks Otherwise she is stable on Seroquel  She continues to be very frail walks with a walker.  Needs assist.  Does recently had a fall and she was trying to call for the nurse Past Medical History:  Diagnosis Date  . Abnormal nuclear stress test   . Actinic cheilitis 04/23/2019  . Anemia    iron deficiency   . Anxiety   . Aortic stenosis    severe by echo 2021   . Arthritis   . Broken arm    right   . Chronic diastolic CHF (congestive heart failure) ([place])   . Chronic pain   . Cognitive communication deficit   . Constipation   . Depression   . Erosive (osteo)arthritis 03/09/2020  . Family history of adverse reaction to anesthesia    pt. states sister vomits  . Fibromyalgia   . GERD (gastroesophageal reflux disease)   . H/O hiatal hernia   . Hallucinations, visual 07/21/2018   03/10/20 psych consult.   . Hematuria 05/18/2020  .  History of bronchitis   . History of kidney stones   . Hypercholesterolemia   . Hypertension    dr t turner  . Hypothyroidism   . Memory loss 04/21/2018   08/08/18 CT head no traumatic findings, atrophy.  09/27/19 MMSE 25/30, failed the clock drawing  . Microhematuria 03/24/2015  . Neuropathy   . Osteoporosis   . Parkinson disease ([place])   . PVD  (peripheral vascular disease) (HCC)    99% stenosis of left anteiror tibial artery, mod stenosis of left distal SFA and popliteal artery followed by [name]  . RBBB    noted on EKG 2018  . Restless leg syndrome   . Sepsis ([place])    hx of due to [facility]  . Septic shock ([place]) 04/19/2020  . Shortness of breath    with exertion - chronic   . Sjogren's disease ([place])   . Sjogren's syndrome [facility]) 02/04/2018   04/16/19 rheumatology: Erosive OA of hands, Sjogrens syndrome, low back pain at multiple sites, recurrent kidney stones, age related osteoporosis w/o current pathological fracture, f/u 6 months.   . SOB (shortness of breath)    chronic due to diastolic dysfunction, deconditioning, obesity  . Spinal stenosis    lumbar region   . Tremor   . Unstable gait 04/21/2018  . Unsteadiness on feet   . Urinary frequency 09/18/2018  . Urinary tract infection   . Visual hallucinations    Past Surgical History:  Procedure Laterality Date  . ABDOMINAL HYSTERECTOMY    . BACK SURGERY     4 back surgeries,   lumbar fusion  . CARDIAC CATHETERIZATION  2006   normal  . CYSTOSCOPY/URETEROSCOPY/HOLMIUM LASER/STENT PLACEMENT Left 01/06/2019   Procedure: CYSTOSCOPY/RETROGRADE/URETEROSCOPY/HOLMIUM LASER/BASKET RETRIEVAL/STENT PLACEMENT;  Surgeon: Ceasar Mons, MD;  Location: [facility] ORS;  Service: Urology;  Laterality: Left;  . CYSTOSCOPY/URETEROSCOPY/HOLMIUM LASER/STENT PLACEMENT Bilateral 05/24/2020   Procedure: CYSTOSCOPY/RETROGRADE/URETEROSCOPY/HOLMIUM LASER/STENT PLACEMENT;  Surgeon: Ceasar Mons, MD;  Location: [facility];  Service: Urology;  Laterality: Bilateral;  . EYE SURGERY Bilateral    cateracts  . falls     variious fall, broken wrist,and toes  . FRACTURE SURGERY Right April 2016   Wrist, Pt. fell  . IR NEPHROSTOMY PLACEMENT RIGHT  04/19/2020  . kidney stone removal Left 01/20/2019  . RIGHT/LEFT HEART CATH AND CORONARY ANGIOGRAPHY N/A 02/05/2018   Procedure: RIGHT/LEFT HEART CATH  AND CORONARY ANGIOGRAPHY;  Surgeon: Troy Sine, MD;  Location: [facility] CV LAB;  Service: Cardiovascular;  Laterality: N/A;  . SPINAL CORD STIMULATOR INSERTION N/A 09/09/2015   Procedure: LUMBAR SPINAL CORD STIMULATOR INSERTION;  Surgeon: Clydell Hakim, MD;  Location: [facility] NEURO ORS;  Service: Neurosurgery;  Laterality: N/A;  LUMBAR SPINAL CORD STIMULATOR INSERTION  . SPINE SURGERY  April 2013   Back X's 4  . TOTAL HIP ARTHROPLASTY     right  . WRIST FRACTURE SURGERY Bilateral     Allergies  Allergen Reactions  . Adrenalone   . Lactose Other (See Comments)    abd pain, lactose intolerant  . Morphine Other (See Comments)  . Sulfa Antibiotics Other (See Comments)    Headache, very sick  . Codeine Other (See Comments)    Reaction:  Headaches and nightmares   . Lactose Intolerance (Gi) Nausea And Vomiting  . Latex Rash  . Lyrica [Pregabalin] Swelling and Other (See Comments)    Reaction:  Leg swelling  . Other Other (See Comments)    Pt states that pain medications give her nightmares.    Marland Kitchen  Plaquenil [Hydroxychloroquine Sulfate] Other (See Comments)    Reaction:  GI upset   . Reglan [Metoclopramide] Other (See Comments)    Reaction:  GI upset   . Requip [Ropinirole Hcl] Other (See Comments)    Reaction:  GI upset   . Septra [Sulfamethoxazole-Trimethoprim] Nausea And Vomiting  . Shellfish Allergy Nausea And Vomiting    Allergies as of 08/02/2020      Reactions   Adrenalone    Lactose Other (See Comments)   abd pain, lactose intolerant   Morphine Other (See Comments)   Sulfa Antibiotics Other (See Comments)   Headache, very sick   Codeine Other (See Comments)   Reaction:  Headaches and nightmares    Lactose Intolerance (gi) Nausea And Vomiting   Latex Rash   Lyrica [pregabalin] Swelling, Other (See Comments)   Reaction:  Leg swelling   Other Other (See Comments)   Pt states that pain medications give her nightmares.     Plaquenil [hydroxychloroquine Sulfate] Other  (See Comments)   Reaction:  GI upset    Reglan [metoclopramide] Other (See Comments)   Reaction:  GI upset    Requip [ropinirole Hcl] Other (See Comments)   Reaction:  GI upset    Septra [sulfamethoxazole-trimethoprim] Nausea And Vomiting   Shellfish Allergy Nausea And Vomiting      Medication List       Accurate as of August 02, 2020  9:56 AM. If you have any questions, ask your nurse or doctor.        acetaminophen 500 MG tablet Commonly known as: TYLENOL Take 500 mg by mouth daily as needed for mild pain. not to exceed 3 gm in 24 hours. As needed for arthritis pain   TYLENOL 500 MG tablet Generic drug: acetaminophen Take 500 mg by mouth 2 (two) times daily.   AeroChamber MV inhaler by Other route. Use as instructed as needed   albuterol 108 (90 Base) MCG/ACT inhaler Commonly known as: VENTOLIN HFA Inhale 2 puffs into the lungs every 6 (six) hours as needed for wheezing or shortness of breath.   aspirin EC 81 MG tablet Take 81 mg by mouth daily.   Calcium 600/Vitamin D3 600-800 MG-UNIT Tabs Generic drug: Calcium Carb-Cholecalciferol Take 1 tablet by mouth daily.   carbidopa-levodopa 25-100 MG tablet Commonly known as: SINEMET IR Take 2 tablets by mouth in the morning and at bedtime.   Cholecalciferol 50 MCG (2000 UT) Tabs Take 1 tablet by mouth daily.   docusate sodium 100 MG capsule Commonly known as: COLACE Take 100 mg by mouth at bedtime.   eucerin cream Apply 1 application topically daily.   ezetimibe 10 MG tablet Commonly known as: ZETIA TAKE 1 TABLET ONCE DAILY.   famotidine 20 MG tablet Commonly known as: PEPCID Take 20 mg by mouth daily.   furosemide 20 MG tablet Commonly known as: LASIX Take 20 mg by mouth daily.   hydroxychloroquine 200 MG tablet Commonly known as: PLAQUENIL Take 200 mg by mouth daily.   lansoprazole 30 MG disintegrating tablet Commonly known as: PREVACID SOLUTAB Take 30 mg by mouth daily at 12 noon.   levothyroxine  50 MCG tablet Commonly known as: SYNTHROID Take 50 mcg by mouth daily before breakfast.   lubiprostone 24 MCG capsule Commonly known as: AMITIZA Take 24 mcg by mouth 2 (two) times daily with a meal.   multivitamin with minerals tablet Take 1 tablet by mouth daily.   PRESERVISION AREDS 2 PO Take 1 tablet by mouth daily.  nystatin powder Commonly known as: MYCOSTATIN/NYSTOP Apply topically 2 (two) times daily as needed.   potassium chloride 10 MEQ tablet Commonly known as: KLOR-CON Take 10 mEq by mouth daily.   pramipexole 1 MG tablet Commonly known as: MIRAPEX Take 1 mg by mouth 2 (two) times daily.   QUEtiapine 25 MG tablet Commonly known as: SEROQUEL Take 12.5 mg by mouth every morning. 1/2 tablet to = 12.5 mg QAM   QUEtiapine 25 MG tablet Commonly known as: SEROQUEL Take 25 mg by mouth daily.   rosuvastatin 20 MG tablet Commonly known as: CRESTOR Take 20 mg by mouth daily.   senna-docusate 8.6-50 MG tablet Commonly known as: Senokot-S Take 1 tablet by mouth at bedtime.   sertraline 50 MG tablet Commonly known as: ZOLOFT Take 75 mg by mouth at bedtime. 1-1/2 tablets to = 75 mg   Slow Fe 142 (45 Fe) MG Tbcr Generic drug: Ferrous Sulfate Take 1 tablet by mouth. Daily   Systane 0.4-0.3 % Soln Generic drug: Polyethyl Glycol-Propyl Glycol Apply 1 drop to eye 2 (two) times daily as needed (dry eyes).       Review of Systems  Constitutional: Positive for activity change and unexpected weight change.  HENT: Negative.   Respiratory: Positive for shortness of breath.   Cardiovascular: Positive for leg swelling.  Gastrointestinal: Positive for constipation.  Genitourinary: Positive for dysuria and flank pain.  Musculoskeletal: Positive for gait problem.  Skin: Negative.   Neurological: Positive for weakness.  Psychiatric/Behavioral: Positive for hallucinations.  All other systems reviewed and are negative.   Immunization History  Administered Date(s)  Administered  . Influenza Split 09/09/2013  . Influenza, High Dose Seasonal PF 09/12/2018, 09/15/2019  . Influenza,inj,Quad PF,6+ Mos 09/11/2016  . Moderna SARS-COVID-2 Vaccination 12/12/2019, 01/09/2020  . Pneumococcal Conjugate-13 04/15/2014  . Pneumococcal Polysaccharide-23 09/28/2005  . Tdap 09/01/2015  . Zoster Recombinat (Shingrix) 06/11/2018, 09/10/2018   Pertinent  Health Maintenance Due  Topic Date Due  . INFLUENZA VACCINE  07/10/2020  . DEXA SCAN  Completed  . PNA vac Low Risk Adult  Completed   Fall Risk  11/16/2019 04/23/2019 04/09/2019 10/29/2018 10/27/2018  Falls in the past year? 0 0 0 0 1  Comment - - - fell June 2019 -  Number falls in past yr: - 0 0 - 0  Injury with Fall? - 0 0 - 1  Comment - - - - -  Risk for fall due to : - - - - -  Risk for fall due to: Comment - - - - -   Functional Status Survey:    Vitals:   08/02/20 0941  BP: 112/62  Pulse: 78  Resp: 18  Temp: 98.8 F (37.1 C)  SpO2: 97%  Weight: 138 lb 8 oz (62.8 kg)  Height: '5\' 2"'  (1.575 m)   Body mass index is 25.33 kg/m. Physical Exam Vitals reviewed.  Constitutional:      Appearance: Normal appearance.  HENT:     Head: Normocephalic.     Nose: Nose normal.     Mouth/Throat:     Mouth: Mucous membranes are moist.     Pharynx: Oropharynx is clear.  Eyes:     Pupils: Pupils are equal, round, and reactive to light.  Cardiovascular:     Rate and Rhythm: Normal rate and regular rhythm.     Heart sounds: Murmur heard.   Pulmonary:     Effort: Pulmonary effort is normal. No respiratory distress.     Breath sounds: Normal  breath sounds. No wheezing.  Abdominal:     General: Abdomen is flat. Bowel sounds are normal.     Palpations: Abdomen is soft.  Musculoskeletal:        General: Swelling present.     Cervical back: Neck supple.  Skin:    General: Skin is warm and dry.  Neurological:     General: No focal deficit present.     Mental Status: She is alert.  Psychiatric:         Mood and Affect: Mood normal.        Thought Content: Thought content normal.     Labs reviewed: Recent Labs    04/21/20 0325 04/22/20 0605 04/22/20 0903 04/23/20 0350 04/23/20 0350 04/24/20 0423 04/25/20 0427 05/24/20 1015  NA 143  --    < > 144   < > 145 146* 141  K 3.4*  --    < > 3.1*   < > 3.0* 3.8 3.5  CL 115*  --    < > 119*   < > 115* 115* 107  CO2 20*  --    < > 21*   < > '22 26 24  ' GLUCOSE 129*  --    < > 80   < > 85 88 100*  BUN 35*  --    < > 33*   < > 26* 16 12  CREATININE 1.23*  --    < > 1.01*   < > 0.78 0.81 0.81  CALCIUM 7.2*  --    < > 7.2*   < > 7.0* 7.6* 8.9  MG 3.0* 2.6*  --  2.5*  --   --   --   --   PHOS 3.2 2.6  --  2.3*  --   --   --   --    < > = values in this interval not displayed.   Recent Labs    04/20/20 0455 04/21/20 0325 04/24/20 0423  AST 117* 50* 32  ALT '18 8 7  ' ALKPHOS 95 95 77  BILITOT 0.5 0.5 0.6  PROT 5.1* 5.4* 4.7*  ALBUMIN 2.3* 2.3* 2.3*   Recent Labs    04/15/20 0000 04/24/20 0423 04/24/20 0423 04/25/20 0427 04/25/20 0427 04/26/20 0000 04/26/20 0000 05/24/20 1015 06/21/20 0000 06/23/20 0000 06/30/20 0000  WBC   < > 14.5*   < > 13.8*   < > 10.7   < > 7.8 7.6 7.5 9.1  NEUTROABS  --   --   --   --   --  7,447  --   --  5,236  --  6,406  HGB   < > 8.8*   < > 9.4*   < > 9.2*   < > 9.8* 8.9* 8.8* 8.7*  HCT   < > 27.1*   < > 29.7*   < > 28*   < > 30.9* 28* 27* 27*  MCV  --  97.5  --  96.7  --   --   --  98.4  --   --   --   PLT   < > 194   < > 226   < > 245   < > 286 314 325 443*   < > = values in this interval not displayed.   Lab Results  Component Value Date   TSH 1.62 09/08/2019   No results found for: HGBA1C Lab Results  Component Value Date   CHOL 118 07/14/2019  HDL 50 07/14/2019   LDLCALC 32 07/14/2019   TRIG 182 (H) 07/14/2019   CHOLHDL 2.4 07/14/2019    Significant Diagnostic Results in last 30 days:  No results found.  Assessment/Plan Dysuria Patient is at high risk for Urosepsis UA  Positive for Leucoestrase and White Count Though Cuture was negative. Keflex 500 mg BID for 10 days Will keep her on Prophylactic dose Follow up with Urology Unstable gait Continue Therapy Iron deficiency anemia due to chronic blood loss Due to hematuria Repeat CBC On Iron Parkinson's disease ([place]) Stable on Sinemet and Pramipexole Nonrheumatic aortic valve stenosis Follow up with Cardiology Leaning towards Intervention Hallucinations, visual Stable oN Seroquel Erosive (osteo)arthritis Doing well on Plaquenil Chronic diastolic CHF (congestive heart failure) (HCC) Low dose of Lasix GERD [place] Prevacid to Protonix for Insurance Hypothyroidism Check TSH Hyperlipidemia On Crestor Check Lipid Panel  Family/ staff Communication:   Labs/tests ordered:  CBC and BMP, TSh, Lipid Panel

## 2020-08-04 DIAGNOSIS — E785 Hyperlipidemia, unspecified: Secondary | ICD-10-CM | POA: Diagnosis not present

## 2020-08-04 DIAGNOSIS — H524 Presbyopia: Secondary | ICD-10-CM | POA: Diagnosis not present

## 2020-08-04 DIAGNOSIS — Z79899 Other long term (current) drug therapy: Secondary | ICD-10-CM | POA: Diagnosis not present

## 2020-08-04 DIAGNOSIS — Z961 Presence of intraocular lens: Secondary | ICD-10-CM | POA: Diagnosis not present

## 2020-08-04 DIAGNOSIS — E039 Hypothyroidism, unspecified: Secondary | ICD-10-CM | POA: Diagnosis not present

## 2020-08-04 DIAGNOSIS — H353132 Nonexudative age-related macular degeneration, bilateral, intermediate dry stage: Secondary | ICD-10-CM | POA: Diagnosis not present

## 2020-08-04 LAB — BASIC METABOLIC PANEL
BUN: 13 (ref 4–21)
CO2: 25 — AB (ref 13–22)
Chloride: 106 (ref 99–108)
Creatinine: 1 (ref 0.5–1.1)
Glucose: 88
Potassium: 3.9 (ref 3.4–5.3)
Sodium: 141 (ref 137–147)

## 2020-08-04 LAB — LIPID PANEL
Cholesterol: 110 (ref 0–200)
HDL: 50 (ref 35–70)
LDL Cholesterol: 37
LDl/HDL Ratio: 2.2
Triglycerides: 147 (ref 40–160)

## 2020-08-04 LAB — CBC AND DIFFERENTIAL
HCT: 28 — AB (ref 36–46)
Hemoglobin: 8.9 — AB (ref 12.0–16.0)
Neutrophils Absolute: 3677
Platelets: 354 (ref 150–399)
WBC: 6.2

## 2020-08-04 LAB — CBC: RBC: 3.08 — AB (ref 3.87–5.11)

## 2020-08-04 LAB — TSH: TSH: 1.21 (ref 0.41–5.90)

## 2020-08-04 LAB — COMPREHENSIVE METABOLIC PANEL: Calcium: 9.5 (ref 8.7–10.7)

## 2020-08-08 DIAGNOSIS — M6281 Muscle weakness (generalized): Secondary | ICD-10-CM | POA: Diagnosis not present

## 2020-08-08 DIAGNOSIS — R278 Other lack of coordination: Secondary | ICD-10-CM | POA: Diagnosis not present

## 2020-08-08 DIAGNOSIS — Z9181 History of falling: Secondary | ICD-10-CM | POA: Diagnosis not present

## 2020-08-08 DIAGNOSIS — R29898 Other symptoms and signs involving the musculoskeletal system: Secondary | ICD-10-CM | POA: Diagnosis not present

## 2020-08-08 DIAGNOSIS — M25511 Pain in right shoulder: Secondary | ICD-10-CM | POA: Diagnosis not present

## 2020-08-08 DIAGNOSIS — R2681 Unsteadiness on feet: Secondary | ICD-10-CM | POA: Diagnosis not present

## 2020-08-09 ENCOUNTER — Telehealth: Payer: Self-pay | Admitting: Cardiovascular Disease

## 2020-08-09 ENCOUNTER — Ambulatory Visit: Payer: Medicare Other | Admitting: Cardiology

## 2020-08-09 NOTE — Telephone Encounter (Signed)
Patient's husband (DPR) would like to have a three way call at the next visit with Dr. Burt Knack, so their children will know what is going on. He also was wondering if there was a way to have them see the video as well. Will forward to Dr. Antionette Char nurse.

## 2020-08-09 NOTE — Telephone Encounter (Signed)
Patient's husband called and stated that he needed to speak with Dr. Burt Knack or nurse regarding appt. Please call

## 2020-08-10 ENCOUNTER — Encounter: Payer: Self-pay | Admitting: Nurse Practitioner

## 2020-08-10 ENCOUNTER — Non-Acute Institutional Stay (SKILLED_NURSING_FACILITY): Payer: Medicare Other | Admitting: Nurse Practitioner

## 2020-08-10 DIAGNOSIS — R0602 Shortness of breath: Secondary | ICD-10-CM | POA: Diagnosis not present

## 2020-08-10 DIAGNOSIS — E02 Subclinical iodine-deficiency hypothyroidism: Secondary | ICD-10-CM | POA: Diagnosis not present

## 2020-08-10 DIAGNOSIS — Z9181 History of falling: Secondary | ICD-10-CM | POA: Diagnosis not present

## 2020-08-10 DIAGNOSIS — K5909 Other constipation: Secondary | ICD-10-CM | POA: Diagnosis not present

## 2020-08-10 DIAGNOSIS — I5032 Chronic diastolic (congestive) heart failure: Secondary | ICD-10-CM

## 2020-08-10 DIAGNOSIS — R2681 Unsteadiness on feet: Secondary | ICD-10-CM | POA: Diagnosis not present

## 2020-08-10 DIAGNOSIS — F323 Major depressive disorder, single episode, severe with psychotic features: Secondary | ICD-10-CM | POA: Diagnosis not present

## 2020-08-10 DIAGNOSIS — N390 Urinary tract infection, site not specified: Secondary | ICD-10-CM

## 2020-08-10 DIAGNOSIS — G8929 Other chronic pain: Secondary | ICD-10-CM | POA: Diagnosis not present

## 2020-08-10 DIAGNOSIS — K219 Gastro-esophageal reflux disease without esophagitis: Secondary | ICD-10-CM

## 2020-08-10 DIAGNOSIS — I350 Nonrheumatic aortic (valve) stenosis: Secondary | ICD-10-CM | POA: Diagnosis not present

## 2020-08-10 DIAGNOSIS — R278 Other lack of coordination: Secondary | ICD-10-CM | POA: Diagnosis not present

## 2020-08-10 DIAGNOSIS — D500 Iron deficiency anemia secondary to blood loss (chronic): Secondary | ICD-10-CM

## 2020-08-10 DIAGNOSIS — F419 Anxiety disorder, unspecified: Secondary | ICD-10-CM | POA: Diagnosis not present

## 2020-08-10 DIAGNOSIS — M6281 Muscle weakness (generalized): Secondary | ICD-10-CM | POA: Diagnosis not present

## 2020-08-10 DIAGNOSIS — G20 Parkinson's disease: Secondary | ICD-10-CM | POA: Diagnosis not present

## 2020-08-10 DIAGNOSIS — R279 Unspecified lack of coordination: Secondary | ICD-10-CM | POA: Diagnosis not present

## 2020-08-10 DIAGNOSIS — R441 Visual hallucinations: Secondary | ICD-10-CM | POA: Diagnosis not present

## 2020-08-10 DIAGNOSIS — Z299 Encounter for prophylactic measures, unspecified: Secondary | ICD-10-CM | POA: Diagnosis not present

## 2020-08-10 DIAGNOSIS — M154 Erosive (osteo)arthritis: Secondary | ICD-10-CM

## 2020-08-10 DIAGNOSIS — E039 Hypothyroidism, unspecified: Secondary | ICD-10-CM | POA: Diagnosis not present

## 2020-08-10 DIAGNOSIS — K22719 Barrett's esophagus with dysplasia, unspecified: Secondary | ICD-10-CM | POA: Diagnosis not present

## 2020-08-10 NOTE — Assessment & Plan Note (Signed)
Dysuria, on and off excruciating pain upon urination, stops when urine steam ends, denied blood in urine, pending UA C/S, f/u Urology, recently ureter stents/percutanous nephrostomy  removed. Hx of rena lithiasis, recurrent UTI, urosepsis. Treated with Keflex 500mg  bid x 10 days since 08/02/20, c/o dysuria again after initial improvement.

## 2020-08-10 NOTE — Progress Notes (Signed)
Location:   SNF [place] Room Number: 45 Place of Service:  SNF (31) Provider: [facility] Cathren Sween NP  [name], MD  Patient Care Team: [name], MD as PCP - General (Internal Medicine) [name], MD as PCP - Cardiology (Cardiology) [name], MD (Neurosurgery) [name], MD (Neurology) [name], MD (Inactive) as Consulting Physician (Gastroenterology) [name], MD as Consulting Physician (Urology) Ailin Rochford X, NP as Nurse Practitioner (Internal Medicine)  Extended Emergency Contact Information Primary Emergency Contact: [facility] Address: [address]          [place], [place] [address] [name] of [place] Phone: [phone] Mobile Phone: [phone] Relation: Spouse Secondary Emergency Contact: [name] Mobile Phone: [phone] Relation: Daughter Preferred language: [name] Interpreter needed? No  Code Status:  DNR Goals of care: Advanced Directive information Advanced Directives 08/10/2020  Does Patient Have a Medical Advance Directive? Yes  Type of Advance Directive Out of facility DNR (pink MOST or yellow form)  Does patient want to make changes to medical advance directive? No - Patient declined  Copy of [place] in Chart? -  Would patient like information on creating a medical advance directive? -  Pre-existing out of facility DNR order (yellow form or pink MOST form) Yellow form placed in chart (order not valid for inpatient use)     Chief Complaint  Patient presents with  . Medical Management of Chronic Issues    Routine follow up visit.  Marland Kitchen Best Practice Recommendations    Flu vaccine    HPI:  Pt is a 86 y.o. female seen today for medical management of chronic diseases.    Dysuria, on and off excruciating pain upon urination, stops when urine steam ends, denied blood in urine, pending UA C/S, f/u Urology, recently ureter stents/percutanous nephrostomy   removed. Hx of rena lithiasis, recurrent UTI, urosepsis. Treated with Keflex 531m bid x 10 days since 08/02/20, c/o dysuria again after initial improvement.              GERD, takes Lansoprazole 328mqd, Famotidine 2023md. Parkinson's, takes MiraPex 1mg39md, Sinemet 25/100 II bid.  Depression, takes Sertraline 75mg5m Quetiapine 25mg 76m12.5mg qd44mnstipation, takes Senokot S I qd, Amitiza 24mcg q39molace qd Hypothyroidism, takes Levothyroxine 50mcg qd2mrosive arthritis, takes Plaquenil 200mg qd, 55mnol 500mg bid. 29m/trace edema BLE, takes Furosemide 20mg qd, ca77mlogy, severe aortic valve stenosis.  Anemia, takes Fe                        Past Medical History:  Diagnosis Date  . Abnormal nuclear stress test   . Actinic cheilitis 04/23/2019  . Anemia    iron deficiency   . Anxiety   . Aortic stenosis    severe by echo 2021   . Arthritis   . Broken arm    right   . Chronic diastolic CHF (congestive heart failure) (HCC)   . ChrHopkinsc pain   . Cognitive communication deficit   . Constipation   . Depression   . Erosive (osteo)arthritis 03/09/2020  . Family history of adverse reaction to anesthesia    pt. states sister vomits  . Fibromyalgia   . GERD (gastroesophageal reflux disease)   . H/O hiatal hernia   . Hallucinations, visual 07/21/2018   03/10/20 psych consult.   . Hematuria 05/18/2020  . History of bronchitis   . History of kidney stones   .  Hypercholesterolemia   . Hypertension    dr t turner  . Hypothyroidism   . Memory loss 04/21/2018   08/08/18 CT head no traumatic findings, atrophy.  09/27/19 MMSE 25/30, failed the clock drawing  . Microhematuria 03/24/2015  . Neuropathy   . Osteoporosis   . Parkinson disease ([place])   . PVD (peripheral vascular disease) (HCC)    99% stenosis of left anteiror tibial artery, mod stenosis of left distal SFA and popliteal artery followed by Dr.  Oneida Alar  . RBBB    noted on EKG 2018  . Restless leg syndrome   . Sepsis ([place])    hx of due to [facility]  . Septic shock ([place]) 04/19/2020  . Shortness of breath    with exertion - chronic   . Sjogren's disease ([place])   . Sjogren's syndrome [facility]) 02/04/2018   04/16/19 rheumatology: Erosive OA of hands, Sjogrens syndrome, low back pain at multiple sites, recurrent kidney stones, age related osteoporosis w/o current pathological fracture, f/u 6 months.   . SOB (shortness of breath)    chronic due to diastolic dysfunction, deconditioning, obesity  . Spinal stenosis    lumbar region   . Tremor   . Unstable gait 04/21/2018  . Unsteadiness on feet   . Urinary frequency 09/18/2018  . Urinary tract infection   . Visual hallucinations    Past Surgical History:  Procedure Laterality Date  . ABDOMINAL HYSTERECTOMY    . BACK SURGERY     4 back surgeries,   lumbar fusion  . CARDIAC CATHETERIZATION  2006   normal  . CYSTOSCOPY/URETEROSCOPY/HOLMIUM LASER/STENT PLACEMENT Left 01/06/2019   Procedure: CYSTOSCOPY/RETROGRADE/URETEROSCOPY/HOLMIUM LASER/BASKET RETRIEVAL/STENT PLACEMENT;  Surgeon: Ceasar Mons, MD;  Location: [facility]S;  Service: Urology;  Laterality: Left;  . CYSTOSCOPY/URETEROSCOPY/HOLMIUM LASER/STENT PLACEMENT Bilateral 05/24/2020   Procedure: CYSTOSCOPY/RETROGRADE/URETEROSCOPY/HOLMIUM LASER/STENT PLACEMENT;  Surgeon: Ceasar Mons, MD;  Location: [facility] OR[facility];  Service: Urology;  Laterality: Bilateral;  . EYE SURGERY Bilateral    cateracts  . falls     variious fall, broken wrist,and toes  . FRACTURE SURGERY Right April 2016   Wrist, Pt. fell  . IR NEPHROSTOMY PLACEMENT RIGHT  04/19/2020  . kidney stone removal Left 01/20/2019  . RIGHT/LEFT HEART CATH AND CORONARY ANGIOGRAPHY N/A 02/05/2018   Procedure: RIGHT/LEFT HEART CATH AND CORONARY ANGIOGRAPHY;  Surgeon: Troy Sine, MD;  Location: [facility];  Service: Cardiovascular;  Laterality: N/A;  . SPINAL CORD  STIMULATOR INSERTION N/A 09/09/2015   Procedure: LUMBAR SPINAL CORD STIMULATOR INSERTION;  Surgeon: Clydell Hakim, MD;  Location: [facility] OR[facility];  Service: Neurosurgery;  Laterality: N/A;  LUMBAR SPINAL CORD STIMULATOR INSERTION  . SPINE SURGERY  April 2013   Back X's 4  . TOTAL HIP ARTHROPLASTY     right  . WRIST FRACTURE SURGERY Bilateral     Allergies  Allergen Reactions  . Adrenalone   . Lactose Other (See Comments)    abd pain, lactose intolerant  . Morphine Other (See Comments)  . Sulfa Antibiotics Other (See Comments)    Headache, very sick  . Codeine Other (See Comments)    Reaction:  Headaches and nightmares   . Lactose Intolerance (Gi) Nausea And Vomiting  . Latex Rash  . Lyrica [Pregabalin] Swelling and Other (See Comments)    Reaction:  Leg swelling  . Other Other (See Comments)    Pt states that pain medications give her nightmares.    . Plaquenil [Hydroxychloroquine Sulfate] Other (See Comments)    Reaction:  GI upset   . Reglan [Metoclopramide] Other (See Comments)    Reaction:  GI upset   . Requip [Ropinirole Hcl] Other (See Comments)    Reaction:  GI upset   . Septra [Sulfamethoxazole-Trimethoprim] Nausea And Vomiting  . Shellfish Allergy Nausea And Vomiting    Allergies as of 08/10/2020      Reactions   Adrenalone    Lactose Other (See Comments)   abd pain, lactose intolerant   Morphine Other (See Comments)   Sulfa Antibiotics Other (See Comments)   Headache, very sick   Codeine Other (See Comments)   Reaction:  Headaches and nightmares    Lactose Intolerance (gi) Nausea And Vomiting   Latex Rash   Lyrica [pregabalin] Swelling, Other (See Comments)   Reaction:  Leg swelling   Other Other (See Comments)   Pt states that pain medications give her nightmares.     Plaquenil [hydroxychloroquine Sulfate] Other (See Comments)   Reaction:  GI upset    Reglan [metoclopramide] Other (See Comments)   Reaction:  GI upset    Requip [ropinirole Hcl] Other (See  Comments)   Reaction:  GI upset    Septra [sulfamethoxazole-trimethoprim] Nausea And Vomiting   Shellfish Allergy Nausea And Vomiting      Medication List       Accurate as of August 10, 2020 11:59 PM. If you have any questions, ask your nurse or doctor.        STOP taking these medications   lansoprazole 30 MG disintegrating tablet Commonly known as: PREVACID SOLUTAB Stopped by: Azaryah Heathcock X Lynnex Fulp, NP     TAKE these medications   acetaminophen 500 MG tablet Commonly known as: TYLENOL Take 500 mg by mouth daily as needed for mild pain. not to exceed 3 gm in 24 hours. As needed for arthritis pain   TYLENOL 500 MG tablet Generic drug: acetaminophen Take 500 mg by mouth 2 (two) times daily.   AeroChamber MV inhaler by Other route. Use as instructed as needed   albuterol 108 (90 Base) MCG/ACT inhaler Commonly known as: VENTOLIN HFA Inhale 2 puffs into the lungs every 6 (six) hours as needed for wheezing or shortness of breath.   aspirin EC 81 MG tablet Take 81 mg by mouth daily.   Calcium 600/Vitamin D3 600-800 MG-UNIT Tabs Generic drug: Calcium Carb-Cholecalciferol Take 1 tablet by mouth daily.   carbidopa-levodopa 25-100 MG tablet Commonly known as: SINEMET IR Take 2 tablets by mouth in the morning and at bedtime.   cephALEXin 250 MG capsule Commonly known as: KEFLEX Take 250 mg by mouth daily.   cephALEXin 500 MG capsule Commonly known as: KEFLEX Take 500 mg by mouth 2 (two) times daily.   Cholecalciferol 50 MCG (2000 UT) Tabs Take 1 tablet by mouth daily.   docusate sodium 100 MG capsule Commonly known as: COLACE Take 100 mg by mouth at bedtime.   eucerin cream Apply 1 application topically daily.   ezetimibe 10 MG tablet Commonly known as: ZETIA TAKE 1 TABLET ONCE DAILY.   famotidine 20 MG tablet Commonly known as: PEPCID Take 20 mg by mouth daily.   furosemide 20 MG tablet Commonly known as: LASIX Take 20 mg by mouth daily.   hydroxychloroquine  200 MG tablet Commonly known as: PLAQUENIL Take 200 mg by mouth daily.   levothyroxine 50 MCG tablet Commonly known as: SYNTHROID Take 50 mcg by mouth daily before breakfast.   lubiprostone 24 MCG capsule Commonly known as: AMITIZA Take 24 mcg by  mouth 2 (two) times daily with a meal.   multivitamin with minerals tablet Take 1 tablet by mouth daily.   PRESERVISION AREDS 2 PO Take 1 tablet by mouth daily.   nystatin powder Commonly known as: MYCOSTATIN/NYSTOP Apply topically 2 (two) times daily as needed.   pantoprazole 40 MG tablet Commonly known as: PROTONIX Take 40 mg by mouth daily.   potassium chloride 10 MEQ tablet Commonly known as: KLOR-CON Take 10 mEq by mouth daily.   pramipexole 1 MG tablet Commonly known as: MIRAPEX Take 1 mg by mouth 2 (two) times daily.   QUEtiapine 25 MG tablet Commonly known as: SEROQUEL Take 12.5 mg by mouth every morning. 1/2 tablet to = 12.5 mg QAM   QUEtiapine 25 MG tablet Commonly known as: SEROQUEL Take 25 mg by mouth daily.   rosuvastatin 20 MG tablet Commonly known as: CRESTOR Take 20 mg by mouth daily.   senna-docusate 8.6-50 MG tablet Commonly known as: Senokot-S Take 1 tablet by mouth at bedtime.   sertraline 50 MG tablet Commonly known as: ZOLOFT Take 75 mg by mouth at bedtime. 1-1/2 tablets to = 75 mg   Slow Fe 142 (45 Fe) MG Tbcr Generic drug: Ferrous Sulfate Take 1 tablet by mouth. Daily   Systane 0.4-0.3 % Soln Generic drug: Polyethyl Glycol-Propyl Glycol Apply 1 drop to eye 2 (two) times daily as needed (dry eyes).       Review of Systems  Constitutional: Negative for activity change, appetite change and fever.  HENT: Positive for hearing loss. Negative for congestion and voice change.   Eyes: Negative for visual disturbance.  Respiratory: Positive for shortness of breath. Negative for cough.        Chronic DOE  Cardiovascular: Positive for leg swelling.  Gastrointestinal: Negative for abdominal  pain and constipation.  Genitourinary: Positive for dysuria. Negative for flank pain, hematuria and urgency.  Musculoskeletal: Positive for arthralgias, back pain, gait problem and myalgias.       Chronic lower back pain. The R 3rd finger mid knuckle pain.   Skin: Negative for color change.  Neurological: Negative for speech difficulty, weakness and headaches.       Memory lapses.   Psychiatric/Behavioral: Positive for hallucinations. Negative for behavioral problems and sleep disturbance. The patient is not nervous/anxious.        On and off visual hallucination    Immunization History  Administered Date(s) Administered  . Influenza Split 09/09/2013  . Influenza, High Dose Seasonal PF 09/12/2018, 09/15/2019  . Influenza,inj,Quad PF,6+ Mos 09/11/2016  . Moderna SARS-COVID-2 Vaccination 12/12/2019, 01/09/2020  . Pneumococcal Conjugate-13 04/15/2014  . Pneumococcal Polysaccharide-23 09/28/2005  . Tdap 09/01/2015  . Zoster Recombinat (Shingrix) 06/11/2018, 09/10/2018   Pertinent  Health Maintenance Due  Topic Date Due  . INFLUENZA VACCINE  07/10/2020  . DEXA SCAN  Completed  . PNA vac Low Risk Adult  Completed   Fall Risk  11/16/2019 04/23/2019 04/09/2019 10/29/2018 10/27/2018  Falls in the past year? 0 0 0 0 1  Comment - - - fell June 2019 -  Number falls in past yr: - 0 0 - 0  Injury with Fall? - 0 0 - 1  Comment - - - - -  Risk for fall due to : - - - - -  Risk for fall due to: Comment - - - - -   Functional Status Survey:    Vitals:   08/10/20 1318  BP: 118/70  Resp: 18  Temp: (!) 97.1 F (36.2  C)  SpO2: 98%  Weight: 141 lb 1.6 oz (64 kg)  Height: _0  (1.575 m)   Body mass index is 25.81 kg/m. Physical Exam Vitals and nursing note reviewed.  Constitutional:      Appearance: Normal appearance.  HENT:     Head: Normocephalic and atraumatic.     Mouth/Throat:     Mouth: Mucous membranes are moist.  Eyes:     Extraocular Movements: Extraocular movements  intact.     Conjunctiva/sclera: Conjunctivae normal.     Pupils: Pupils are equal, round, and reactive to light.  Cardiovascular:     Rate and Rhythm: Normal rate and regular rhythm.     Heart sounds: Murmur heard.   Pulmonary:     Breath sounds: No rhonchi or rales.  Abdominal:     General: Bowel sounds are normal.     Palpations: Abdomen is soft.     Tenderness: There is no abdominal tenderness. There is no right CVA tenderness, left CVA tenderness, guarding or rebound.     Hernia: No hernia is present.  Musculoskeletal:     Cervical back: Normal range of motion and neck supple.     Right lower leg: Edema present.     Left lower leg: Edema present.     Comments: Trace to 1+ edema BLE.  Arthritic changes in fingers, R>L. The R 3rd finger mid knuckle swelling, no heat or redness, no deformity.   Skin:    General: Skin is warm and dry.     Comments: A marble sized hematoma occiput, abrasion mid thoracic spine.   Neurological:     General: No focal deficit present.     Mental Status: She is alert. Mental status is at baseline.     Gait: Gait abnormal.     Comments: Oriented to person, place.   Psychiatric:        Mood and Affect: Mood normal.        Behavior: Behavior normal.     Labs reviewed: Recent Labs    04/21/20 0325 04/22/20 0605 04/22/20 0903 04/23/20 0350 04/23/20 0350 04/24/20 0423 04/25/20 0427 05/24/20 1015  NA 143  --    < > 144   < > 145 146* 141  K 3.4*  --    < > 3.1*   < > 3.0* 3.8 3.5  CL 115*  --    < > 119*   < > 115* 115* 107  CO2 20*  --    < > 21*   < > _1 GLUCOSE 129*  --    < > 80   < > 85 88 100*  BUN 35*  --    < > 33*   < > 26* 16 12  CREATININE 1.23*  --    < > 1.01*   < > 0.78 0.81 0.81  CALCIUM 7.2*  --    < > 7.2*   < > 7.0* 7.6* 8.9  MG 3.0* 2.6*  --  2.5*  --   --   --   --   PHOS 3.2 2.6  --  2.3*  --   --   --   --    < > = values in this interval not displayed.   Recent Labs    04/20/20 0455 04/21/20 0325  04/24/20 0423  AST 117* 50* 32  ALT _2 ALKPHOS 95 95 77  BILITOT 0.5 0.5 0.6  PROT 5.1* 5.4* 4.7*  ALBUMIN 2.3* 2.3* 2.3*   Recent Labs    04/15/20 0000 04/24/20 0423 04/24/20 0423 04/25/20 0427 04/25/20 0427 04/26/20 0000 04/26/20 0000 05/24/20 1015 06/21/20 0000 06/23/20 0000 06/30/20 0000  WBC   < > 14.5*   < > 13.8*   < > 10.7   < > 7.8 7.6 7.5 9.1  NEUTROABS  --   --   --   --   --  7,447  --   --  5,236  --  6,406  HGB   < > 8.8*   < > 9.4*   < > 9.2*   < > 9.8* 8.9* 8.8* 8.7*  HCT   < > 27.1*   < > 29.7*   < > 28*   < > 30.9* 28* 27* 27*  MCV  --  97.5  --  96.7  --   --   --  98.4  --   --   --   PLT   < > 194   < > 226   < > 245   < > 286 314 325 443*   < > = values in this interval not displayed.   Lab Results  Component Value Date   TSH 1.62 09/08/2019   No results found for: HGBA1C Lab Results  Component Value Date   CHOL 118 07/14/2019   HDL 50 07/14/2019   LDLCALC 32 07/14/2019   TRIG 182 (H) 07/14/2019   CHOLHDL 2.4 07/14/2019    Significant Diagnostic Results in last 30 days:  No results found.  Assessment/Plan  Erosive (osteo)arthritis Erosive arthritis, takes Plaquenil 267m qd, Tylenol 5077mbid. The R 3rd finger mid knuckle flared up, Prednisone 1023md x 5 days.    Recurrent UTI Dysuria, on and off excruciating pain upon urination, stops when urine steam ends, denied blood in urine, pending UA C/S, f/u Urology, recently ureter stents/percutanous nephrostomy  removed. Hx of rena lithiasis, recurrent UTI, urosepsis. Treated with Keflex 500m73md x 10 days since 08/02/20, c/o dysuria again after initial improvement.    Parkinson's disease Parkinson's, takes MiraPex 1mg 61m, Sinemet 25/100 II bid.    GERD (gastroesophageal reflux disease) GERD, takes Lansoprazole 30mg 4mFamotidine 20mg q49m Iron deficiency anemia Stable, continue Fe  Depression, psychotic (HCC) Depression, takes Sertraline 75mg qd68metiapine 25mg qd,57m.5mg qd   59monic constipation Constipation, takes Senokot S I qd, Amitiza 24mcg qd, 63mce qd   Hypothyroidism Hypothyroidism, takes Levothyroxine 50mcg qd. 849m21 TSH 1.21, wbc 6.3, Hgb 8.9, plt 354, Na 141, K 3.9, Bun 13, creat 0.99, eGFR 52, LDL 37  Chronic diastolic CHF (congestive heart failure) (HCC) CHF/trace edema BLE, takes Furosemide 20mg qd, car69mogy, severe aortic valve stenosis-pending decision regarding surgical intervention.     Family/ staff Communication: plan of care reviewed with the patient and charge nurse.   Labs/tests ordered:  none  Time spend 35 minutes.

## 2020-08-10 NOTE — Assessment & Plan Note (Signed)
Stable, continue Fe 

## 2020-08-10 NOTE — Assessment & Plan Note (Addendum)
Parkinson's, takes MiraPex 1mg bid, Sinemet 25/100 II bid.  

## 2020-08-10 NOTE — Assessment & Plan Note (Addendum)
Hypothyroidism, takes Levothyroxine 55mg qd. 08/04/20 TSH 1.21, wbc 6.3, Hgb 8.9, plt 354, Na 141, K 3.9, Bun 13, creat 0.99, eGFR 52, LDL 37

## 2020-08-10 NOTE — Assessment & Plan Note (Signed)
Depression, takes Sertraline 75mg  qd, Quetiapine 25mg  qd, 12.5mg  qd

## 2020-08-10 NOTE — Assessment & Plan Note (Addendum)
CHF/trace edema BLE, takes Furosemide 20mg  qd, cardiology, severe aortic valve stenosis-pending decision regarding surgical intervention.

## 2020-08-10 NOTE — Assessment & Plan Note (Signed)
Constipation, takes Senokot S I qd, Amitiza 59mcg qd, Colace qd

## 2020-08-10 NOTE — Assessment & Plan Note (Addendum)
Erosive arthritis, takes Plaquenil 200mg  qd, Tylenol 500mg  bid. The R 3rd finger mid knuckle flared up, Prednisone 10mg  qd x 5 days.

## 2020-08-10 NOTE — Telephone Encounter (Signed)
Left message to call back  

## 2020-08-10 NOTE — Assessment & Plan Note (Signed)
GERD, takes Lansoprazole 30mg  qd, Famotidine 20mg  qd.

## 2020-08-11 ENCOUNTER — Encounter: Payer: Self-pay | Admitting: Nurse Practitioner

## 2020-08-11 DIAGNOSIS — M6281 Muscle weakness (generalized): Secondary | ICD-10-CM | POA: Diagnosis not present

## 2020-08-11 DIAGNOSIS — E02 Subclinical iodine-deficiency hypothyroidism: Secondary | ICD-10-CM | POA: Diagnosis not present

## 2020-08-11 DIAGNOSIS — Z9181 History of falling: Secondary | ICD-10-CM | POA: Diagnosis not present

## 2020-08-11 DIAGNOSIS — R2681 Unsteadiness on feet: Secondary | ICD-10-CM | POA: Diagnosis not present

## 2020-08-11 DIAGNOSIS — R278 Other lack of coordination: Secondary | ICD-10-CM | POA: Diagnosis not present

## 2020-08-11 DIAGNOSIS — K5909 Other constipation: Secondary | ICD-10-CM | POA: Diagnosis not present

## 2020-08-11 NOTE — Telephone Encounter (Signed)
Patient's husband returning call. 

## 2020-08-11 NOTE — Telephone Encounter (Signed)
Confirmed with the patient and her husband they will both attend Dr. Antionette Char visit in person. They understand they can facetime with a family member during the visit if they'd like another person present.  They were grateful for assistance.

## 2020-08-15 DIAGNOSIS — R2681 Unsteadiness on feet: Secondary | ICD-10-CM | POA: Diagnosis not present

## 2020-08-15 DIAGNOSIS — E02 Subclinical iodine-deficiency hypothyroidism: Secondary | ICD-10-CM | POA: Diagnosis not present

## 2020-08-15 DIAGNOSIS — R278 Other lack of coordination: Secondary | ICD-10-CM | POA: Diagnosis not present

## 2020-08-15 DIAGNOSIS — K5909 Other constipation: Secondary | ICD-10-CM | POA: Diagnosis not present

## 2020-08-15 DIAGNOSIS — Z9181 History of falling: Secondary | ICD-10-CM | POA: Diagnosis not present

## 2020-08-15 DIAGNOSIS — M6281 Muscle weakness (generalized): Secondary | ICD-10-CM | POA: Diagnosis not present

## 2020-08-16 NOTE — Telephone Encounter (Signed)
Patient daughter is calling to provide the number that she can be called when her parents come in for the appt [phone]. Someone would need to assist her parents to do the facetime call.

## 2020-08-17 ENCOUNTER — Other Ambulatory Visit: Payer: Self-pay

## 2020-08-17 ENCOUNTER — Encounter: Payer: Self-pay | Admitting: Cardiovascular Disease

## 2020-08-17 ENCOUNTER — Ambulatory Visit (INDEPENDENT_AMBULATORY_CARE_PROVIDER_SITE_OTHER): Payer: Medicare Other | Admitting: Cardiovascular Disease

## 2020-08-17 VITALS — BP 118/60 | HR 84 | Ht <= 58 in | Wt 136.6 lb

## 2020-08-17 DIAGNOSIS — E02 Subclinical iodine-deficiency hypothyroidism: Secondary | ICD-10-CM | POA: Diagnosis not present

## 2020-08-17 DIAGNOSIS — M6281 Muscle weakness (generalized): Secondary | ICD-10-CM | POA: Diagnosis not present

## 2020-08-17 DIAGNOSIS — R278 Other lack of coordination: Secondary | ICD-10-CM | POA: Diagnosis not present

## 2020-08-17 DIAGNOSIS — I350 Nonrheumatic aortic (valve) stenosis: Secondary | ICD-10-CM

## 2020-08-17 DIAGNOSIS — R2681 Unsteadiness on feet: Secondary | ICD-10-CM | POA: Diagnosis not present

## 2020-08-17 DIAGNOSIS — K5909 Other constipation: Secondary | ICD-10-CM | POA: Diagnosis not present

## 2020-08-17 DIAGNOSIS — Z9181 History of falling: Secondary | ICD-10-CM | POA: Diagnosis not present

## 2020-08-17 NOTE — Patient Instructions (Signed)
Medication Instructions:  Your provider recommends that you continue on your current medications as directed. Please refer to the Current Medication list given to you today.   *If you need a refill on your cardiac medications before your next appointment, please call your pharmacy*  Follow-Up: At [facility], you and your health needs are our priority.  As part of our continuing mission to provide you with exceptional heart care, we have created designated Provider Care Teams.  These Care Teams include your primary Cardiologist (physician) and Advanced Practice Providers (APPs -  Physician Assistants and Nurse Practitioners) who all work together to provide you with the care you need, when you need it. Your next appointment:   3 month(s) The format for your next appointment:   In Person Provider:   Fransico Him, MD

## 2020-08-17 NOTE — Progress Notes (Signed)
Cardiology Office Note:    Date:  08/17/2020   ID:  [name], DOB Apr 13, 1931, MRN [id number]  PCP:  [name], MD  [facility] Cardiologist:  Fransico Him, MD  [place] Electrophysiologist:  None   Referring MD: [name], MD   Chief Complaint  Patient presents with  . Shortness of Breath    History of Present Illness:    [name] is a 86 y.o. female with a hx of aortic stenosis, presenting for follow-up evaluation.  I just recently saw her June 23, 2020 in initial consultation to review treatment options of severe, symptomatic aortic stenosis.  The patient is with her husband today.  They live at [facility] [place].  Her son and daughter are conferenced in via [place].  When I initially saw the patient last month, we elected to give her some further time to recover from her most recent hospitalization.  She has not been in good health for a few years, but her health worsened with a hospitalization in May of this year for E. coli sepsis complicated by shock.  She was then found to have an obstructive right kidney stone and underwent ureteral stent placement.  Her ureteral stent has now been removed and hematuria has cleared up.  She continues to experience generalized weakness, shortness of breath with activity, and fatigue.  She is able to walk short distances with a walker but requires assistance with this.  She is mostly in a wheelchair.  Past Medical History:  Diagnosis Date  . Abnormal nuclear stress test   . Actinic cheilitis 04/23/2019  . Anemia    iron deficiency   . Anxiety   . Aortic stenosis    severe by echo 2021   . Arthritis   . Broken arm    right   . Chronic diastolic CHF (congestive heart failure) ([place])   . Chronic pain   . Cognitive communication deficit   . Constipation   . Depression   . Erosive (osteo)arthritis 03/09/2020  . Family history of adverse reaction to anesthesia    pt. states sister vomits  . Fibromyalgia     . GERD (gastroesophageal reflux disease)   . H/O hiatal hernia   . Hallucinations, visual 07/21/2018   03/10/20 psych consult.   . Hematuria 05/18/2020  . History of bronchitis   . History of kidney stones   . Hypercholesterolemia   . Hypertension    dr t turner  . Hypothyroidism   . Memory loss 04/21/2018   08/08/18 CT head no traumatic findings, atrophy.  09/27/19 MMSE 25/30, failed the clock drawing  . Microhematuria 03/24/2015  . Neuropathy   . Osteoporosis   . Parkinson disease ([place])   . PVD (peripheral vascular disease) ([facility])    99% stenosis of left anteiror tibial artery, mod stenosis of left distal SFA and popliteal artery followed by [name]  . RBBB    noted on EKG 2018  . Restless leg syndrome   . Sepsis ([place])    hx of due to [facility]  . Septic shock ([place]) 04/19/2020  . Shortness of breath    with exertion - chronic   . Sjogren's disease ([place])   . Sjogren's syndrome [facility]) 02/04/2018   04/16/19 rheumatology: Erosive OA of hands, Sjogrens syndrome, low back pain at multiple sites, recurrent kidney stones, age related osteoporosis w/o current pathological fracture, f/u 6 months.   . SOB (shortness of breath)    chronic due to  diastolic dysfunction, deconditioning, obesity  . Spinal stenosis    lumbar region   . Tremor   . Unstable gait 04/21/2018  . Unsteadiness on feet   . Urinary frequency 09/18/2018  . Urinary tract infection   . Visual hallucinations     Past Surgical History:  Procedure Laterality Date  . ABDOMINAL HYSTERECTOMY    . BACK SURGERY     4 back surgeries,   lumbar fusion  . CARDIAC CATHETERIZATION  2006   normal  . CYSTOSCOPY/URETEROSCOPY/HOLMIUM LASER/STENT PLACEMENT Left 01/06/2019   Procedure: CYSTOSCOPY/RETROGRADE/URETEROSCOPY/HOLMIUM LASER/BASKET RETRIEVAL/STENT PLACEMENT;  Surgeon: Ceasar Mons, MD;  Location: [facility];  Service: Urology;  Laterality: Left;  . CYSTOSCOPY/URETEROSCOPY/HOLMIUM LASER/STENT PLACEMENT Bilateral 05/24/2020    Procedure: CYSTOSCOPY/RETROGRADE/URETEROSCOPY/HOLMIUM LASER/STENT PLACEMENT;  Surgeon: Ceasar Mons, MD;  Location: [facility];  Service: Urology;  Laterality: Bilateral;  . EYE SURGERY Bilateral    cateracts  . falls     variious fall, broken wrist,and toes  . FRACTURE SURGERY Right April 2016   Wrist, Pt. fell  . IR NEPHROSTOMY PLACEMENT RIGHT  04/19/2020  . kidney stone removal Left 01/20/2019  . RIGHT/LEFT HEART CATH AND CORONARY ANGIOGRAPHY N/A 02/05/2018   Procedure: RIGHT/LEFT HEART CATH AND CORONARY ANGIOGRAPHY;  Surgeon: Troy Sine, MD;  Location: [facility];  Service: Cardiovascular;  Laterality: N/A;  . SPINAL CORD STIMULATOR INSERTION N/A 09/09/2015   Procedure: LUMBAR SPINAL CORD STIMULATOR INSERTION;  Surgeon: Clydell Hakim, MD;  Location: [place] [facility];  Service: Neurosurgery;  Laterality: N/A;  LUMBAR SPINAL CORD STIMULATOR INSERTION  . SPINE SURGERY  April 2013   Back X's 4  . TOTAL HIP ARTHROPLASTY     right  . WRIST FRACTURE SURGERY Bilateral     Current Medications: Current Meds  Medication Sig  . acetaminophen (TYLENOL) 500 MG tablet Take 500 mg by mouth daily as needed for mild pain. not to exceed 3 gm in 24 hours. As needed for arthritis pain  . albuterol (PROVENTIL HFA;VENTOLIN HFA) 108 (90 Base) MCG/ACT inhaler Inhale 2 puffs into the lungs every 6 (six) hours as needed for wheezing or shortness of breath.  Marland Kitchen aspirin EC 81 MG tablet Take 81 mg by mouth daily.   . Calcium Carb-Cholecalciferol (CALCIUM 600/VITAMIN D3) 600-800 MG-UNIT TABS Take 1 tablet by mouth daily.  . carbamide peroxide (DEBROX) 6.5 % OTIC solution 5 drops at bedtime.  . carbidopa-levodopa (SINEMET IR) 25-100 MG tablet Take 2 tablets by mouth in the morning and at bedtime.   . cephALEXin (KEFLEX) 250 MG capsule Take 250 mg by mouth daily.   . Cholecalciferol (VITAMIN D) 50 MCG (2000 UT) tablet Take 2,000 Units by mouth daily.  . Cholecalciferol 50 MCG (2000 UT) TABS Take 1  tablet by mouth daily.  Marland Kitchen docusate sodium (COLACE) 100 MG capsule Take 100 mg by mouth at bedtime.  Marland Kitchen ezetimibe (ZETIA) 10 MG tablet TAKE 1 TABLET ONCE DAILY.  . famotidine (PEPCID) 20 MG tablet Take 20 mg by mouth daily.  . Ferrous Sulfate (SLOW FE) 142 (45 Fe) MG TBCR Take 1 tablet by mouth. Daily  . furosemide (LASIX) 20 MG tablet Take 20 mg by mouth daily.  . hydroxychloroquine (PLAQUENIL) 200 MG tablet Take 200 mg by mouth daily.  Marland Kitchen levothyroxine (SYNTHROID, LEVOTHROID) 50 MCG tablet Take 50 mcg by mouth daily before breakfast.   . lubiprostone (AMITIZA) 24 MCG capsule Take 24 mcg by mouth 2 (two) times daily with a meal.   . Multiple Vitamins-Minerals (MULTIVITAMIN  WITH MINERALS) tablet Take 1 tablet by mouth daily.  . Multiple Vitamins-Minerals (PRESERVISION AREDS 2 PO) Take 1 tablet by mouth daily.  Marland Kitchen nystatin (MYCOSTATIN/NYSTOP) powder Apply topically 2 (two) times daily as needed.  . pantoprazole (PROTONIX) 40 MG tablet Take 40 mg by mouth daily.  Vladimir Faster Glycol-Propyl Glycol (SYSTANE) 0.4-0.3 % SOLN Apply 1 drop to eye 2 (two) times daily as needed (dry eyes).   . potassium chloride (KLOR-CON) 10 MEQ tablet Take 10 mEq by mouth daily.  . pramipexole (MIRAPEX) 1 MG tablet Take 1 mg by mouth 2 (two) times daily.  . QUEtiapine (SEROQUEL) 25 MG tablet Take 12.5 mg by mouth every morning. 1/2 tablet to = 12.5 mg QAM  . QUEtiapine (SEROQUEL) 25 MG tablet Take 25 mg by mouth daily.  . rosuvastatin (CRESTOR) 20 MG tablet Take 20 mg by mouth daily.  Marland Kitchen saccharomyces boulardii (FLORASTOR) 250 MG capsule Take 250 mg by mouth 2 (two) times daily.  Marland Kitchen senna-docusate (SENOKOT-S) 8.6-50 MG tablet Take 1 tablet by mouth at bedtime.   . sertraline (ZOLOFT) 50 MG tablet Take 75 mg by mouth at bedtime. 1-1/2 tablets to = 75 mg  . Skin Protectants, Misc. (EUCERIN) cream Apply 1 application topically daily.   Marland Kitchen Spacer/Aero-Holding Chambers (AEROCHAMBER MV) inhaler by Other route. Use as instructed as  needed     Allergies:   Adrenalone, Lactose, Morphine, Sulfa antibiotics, Codeine, Lactose intolerance (gi), Latex, Lyrica [pregabalin], Other, Plaquenil [hydroxychloroquine sulfate], Reglan [metoclopramide], Requip [ropinirole hcl], Septra [sulfamethoxazole-trimethoprim], and Shellfish allergy   Social History   Socioeconomic History  . Marital status: Married    Spouse name: [name]  . Number of children: 2  . Years of education: HS  . Highest education level: Not on file  Occupational History    Comment: Homemaker  Tobacco Use  . Smoking status: Never Smoker  . Smokeless tobacco: Never Used  Vaping Use  . Vaping Use: Never used  Substance and Sexual Activity  . Alcohol use: No    Alcohol/week: 0.0 standard drinks  . Drug use: No  . Sexual activity: Never  Other Topics Concern  . Not on file  Social History Narrative   Patient lives at home with her spouse. Married in 1957. Two people lives in the home, no pets.    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    Caffeine Use: tea   Social Determinants of Health   Financial Resource Strain:   . Difficulty of Paying Living Expenses: Not on file  Food Insecurity:   . Worried About Charity fundraiser in the Last Year: Not on file  . Ran Out of Food in the Last Year: Not on file  Transportation Needs:   . Lack of Transportation (Medical): Not on file  . Lack of Transportation (Non-Medical): Not on file  Physical Activity:   . Days of Exercise per Week: Not on file  . Minutes of Exercise per Session: Not on file  Stress:   . Feeling of Stress : Not on file  Social Connections:   . Frequency of Communication with Friends and Family: Not on file  . Frequency of Social Gatherings with Friends and Family: Not on file  . Attends Religious Services: Not on file  . Active Member of Clubs or Organizations: Not on file  . Attends Archivist Meetings: Not on file  .  Marital Status: Not on file  Family History: The patient's family history includes COPD in her father; Heart attack in her mother; Heart disease in her mother; Heart failure in her mother; Hypertension in her mother; Restless legs syndrome in her mother.  ROS:   Please see the history of present illness.    All other systems reviewed and are negative.  EKGs/Labs/Other Studies Reviewed:    EKG:  EKG is not ordered today.    Recent Labs: 09/08/2019: TSH 1.62 04/23/2020: Magnesium 2.5 04/24/2020: ALT 7 05/24/2020: BUN 12; Creatinine, Ser 0.81; Potassium 3.5; Sodium 141 06/30/2020: Hemoglobin 8.7; Platelets 443  Recent Lipid Panel    Component Value Date/Time   CHOL 118 07/14/2019 0811   TRIG 182 (H) 07/14/2019 0811   HDL 50 07/14/2019 0811   CHOLHDL 2.4 07/14/2019 0811   CHOLHDL 2.7 07/01/2018 0715   LDLCALC 32 07/14/2019 0811   LDLCALC 77 07/01/2018 0715    Physical Exam:    VS:  BP 118/60   Pulse 84   Ht 4\' 10"  (1.473 m)   Wt 136 lb 9.6 oz (62 kg)   SpO2 97%   BMI 28.55 kg/m     Wt Readings from Last 3 Encounters:  08/17/20 136 lb 9.6 oz (62 kg)  08/10/20 141 lb 1.6 oz (64 kg)  08/02/20 138 lb 8 oz (62.8 kg)     GEN: Elderly woman in wheelchair, in no acute distress HEENT: Normal NECK: No JVD; bilateral carotid bruits LYMPHATICS: No lymphadenopathy CARDIAC: RRR, 3/6 harsh late peaking systolic murmur at the right upper sternal border RESPIRATORY:  Clear to auscultation without rales, wheezing or rhonchi  ABDOMEN: Soft, non-tender, non-distended MUSCULOSKELETAL: 1+ bilateral pretibial edema; No deformity  SKIN: Warm and dry NEUROLOGIC:  Alert and oriented x 3 PSYCHIATRIC:  Normal affect   ASSESSMENT:    1. Severe aortic stenosis    PLAN:    In order of problems listed above:  1. The patient has severe, stage D1 aortic stenosis with New York Heart Association functional class III symptoms of chronic diastolic heart failure.  I think her fatigue,  weakness, and shortness of breath are all multifactorial with aortic stenosis and generalized deconditioning/frailty contributing to her symptoms.  We discussed the natural history of aortic stenosis without treatment.  She understands that this will be predicted to worsen and cause progressive symptoms of shortness of breath and possibly congestive heart failure.  She understands that this can impact her mortality.  Unfortunately, the patient exhibits extreme physical frailty.  Considering this along with her advanced age and poor functional capacity, I do not think she is a good candidate to undergo the evaluation and treatment required for TAVR.  I think the risk of a setback or complication would likely exceed the potential benefit that she would receive.  Her adult children are in agreement.  The patient and her husband understand my recommendations.  She may ultimately require a palliative approach to her care if her symptoms worsen.  We had extensive discussion about medical therapy, use of diuretics for pulmonary congestion, and palliative options if she worsens.  She will continue to follow with Dr. Radford Pax.  I will request a follow-up visit with her in 3 to 4 months.   Medication Adjustments/Labs and Tests Ordered: Current medicines are reviewed at length with the patient today.  Concerns regarding medicines are outlined above.  No orders of the defined types were placed in this encounter.  No orders of the defined types were placed in this encounter.  Patient Instructions  Medication Instructions:  Your provider recommends that you continue on your current medications as directed. Please refer to the Current Medication list given to you today.   *If you need a refill on your cardiac medications before your next appointment, please call your pharmacy*  Follow-Up: At [facility], you and your health needs are our priority.  As part of our continuing mission to provide you with exceptional  heart care, we have created designated Provider Care Teams.  These Care Teams include your primary Cardiologist (physician) and Advanced Practice Providers (APPs -  Physician Assistants and Nurse Practitioners) who all work together to provide you with the care you need, when you need it. Your next appointment:   3 month(s) The format for your next appointment:   In Person Provider:   Fransico Him, MD      Signed, Sherren Mocha, MD  08/17/2020 5:54 PM    [place]

## 2020-08-18 DIAGNOSIS — Z9181 History of falling: Secondary | ICD-10-CM | POA: Diagnosis not present

## 2020-08-18 DIAGNOSIS — K5909 Other constipation: Secondary | ICD-10-CM | POA: Diagnosis not present

## 2020-08-18 DIAGNOSIS — M6281 Muscle weakness (generalized): Secondary | ICD-10-CM | POA: Diagnosis not present

## 2020-08-18 DIAGNOSIS — R2681 Unsteadiness on feet: Secondary | ICD-10-CM | POA: Diagnosis not present

## 2020-08-18 DIAGNOSIS — R278 Other lack of coordination: Secondary | ICD-10-CM | POA: Diagnosis not present

## 2020-08-18 DIAGNOSIS — E02 Subclinical iodine-deficiency hypothyroidism: Secondary | ICD-10-CM | POA: Diagnosis not present

## 2020-08-23 DIAGNOSIS — R278 Other lack of coordination: Secondary | ICD-10-CM | POA: Diagnosis not present

## 2020-08-23 DIAGNOSIS — E02 Subclinical iodine-deficiency hypothyroidism: Secondary | ICD-10-CM | POA: Diagnosis not present

## 2020-08-23 DIAGNOSIS — M6281 Muscle weakness (generalized): Secondary | ICD-10-CM | POA: Diagnosis not present

## 2020-08-23 DIAGNOSIS — K5909 Other constipation: Secondary | ICD-10-CM | POA: Diagnosis not present

## 2020-08-23 DIAGNOSIS — R2681 Unsteadiness on feet: Secondary | ICD-10-CM | POA: Diagnosis not present

## 2020-08-23 DIAGNOSIS — Z9181 History of falling: Secondary | ICD-10-CM | POA: Diagnosis not present

## 2020-08-24 DIAGNOSIS — K5909 Other constipation: Secondary | ICD-10-CM | POA: Diagnosis not present

## 2020-08-24 DIAGNOSIS — R2681 Unsteadiness on feet: Secondary | ICD-10-CM | POA: Diagnosis not present

## 2020-08-24 DIAGNOSIS — E02 Subclinical iodine-deficiency hypothyroidism: Secondary | ICD-10-CM | POA: Diagnosis not present

## 2020-08-24 DIAGNOSIS — R278 Other lack of coordination: Secondary | ICD-10-CM | POA: Diagnosis not present

## 2020-08-24 DIAGNOSIS — Z9181 History of falling: Secondary | ICD-10-CM | POA: Diagnosis not present

## 2020-08-24 DIAGNOSIS — M6281 Muscle weakness (generalized): Secondary | ICD-10-CM | POA: Diagnosis not present

## 2020-08-25 DIAGNOSIS — R278 Other lack of coordination: Secondary | ICD-10-CM | POA: Diagnosis not present

## 2020-08-25 DIAGNOSIS — E02 Subclinical iodine-deficiency hypothyroidism: Secondary | ICD-10-CM | POA: Diagnosis not present

## 2020-08-25 DIAGNOSIS — K5909 Other constipation: Secondary | ICD-10-CM | POA: Diagnosis not present

## 2020-08-25 DIAGNOSIS — Z9181 History of falling: Secondary | ICD-10-CM | POA: Diagnosis not present

## 2020-08-25 DIAGNOSIS — R2681 Unsteadiness on feet: Secondary | ICD-10-CM | POA: Diagnosis not present

## 2020-08-25 DIAGNOSIS — M6281 Muscle weakness (generalized): Secondary | ICD-10-CM | POA: Diagnosis not present

## 2020-08-30 DIAGNOSIS — K5909 Other constipation: Secondary | ICD-10-CM | POA: Diagnosis not present

## 2020-08-30 DIAGNOSIS — R278 Other lack of coordination: Secondary | ICD-10-CM | POA: Diagnosis not present

## 2020-08-30 DIAGNOSIS — Z9181 History of falling: Secondary | ICD-10-CM | POA: Diagnosis not present

## 2020-08-30 DIAGNOSIS — M6281 Muscle weakness (generalized): Secondary | ICD-10-CM | POA: Diagnosis not present

## 2020-08-30 DIAGNOSIS — R2681 Unsteadiness on feet: Secondary | ICD-10-CM | POA: Diagnosis not present

## 2020-08-30 DIAGNOSIS — E02 Subclinical iodine-deficiency hypothyroidism: Secondary | ICD-10-CM | POA: Diagnosis not present

## 2020-08-31 DIAGNOSIS — R2681 Unsteadiness on feet: Secondary | ICD-10-CM | POA: Diagnosis not present

## 2020-08-31 DIAGNOSIS — M6281 Muscle weakness (generalized): Secondary | ICD-10-CM | POA: Diagnosis not present

## 2020-08-31 DIAGNOSIS — R278 Other lack of coordination: Secondary | ICD-10-CM | POA: Diagnosis not present

## 2020-08-31 DIAGNOSIS — Z9181 History of falling: Secondary | ICD-10-CM | POA: Diagnosis not present

## 2020-08-31 DIAGNOSIS — E02 Subclinical iodine-deficiency hypothyroidism: Secondary | ICD-10-CM | POA: Diagnosis not present

## 2020-08-31 DIAGNOSIS — K5909 Other constipation: Secondary | ICD-10-CM | POA: Diagnosis not present

## 2020-09-02 DIAGNOSIS — N200 Calculus of kidney: Secondary | ICD-10-CM | POA: Diagnosis not present

## 2020-09-02 DIAGNOSIS — E02 Subclinical iodine-deficiency hypothyroidism: Secondary | ICD-10-CM | POA: Diagnosis not present

## 2020-09-02 DIAGNOSIS — R2681 Unsteadiness on feet: Secondary | ICD-10-CM | POA: Diagnosis not present

## 2020-09-02 DIAGNOSIS — K5909 Other constipation: Secondary | ICD-10-CM | POA: Diagnosis not present

## 2020-09-02 DIAGNOSIS — R278 Other lack of coordination: Secondary | ICD-10-CM | POA: Diagnosis not present

## 2020-09-02 DIAGNOSIS — Z9181 History of falling: Secondary | ICD-10-CM | POA: Diagnosis not present

## 2020-09-02 DIAGNOSIS — M6281 Muscle weakness (generalized): Secondary | ICD-10-CM | POA: Diagnosis not present

## 2020-09-06 DIAGNOSIS — M6281 Muscle weakness (generalized): Secondary | ICD-10-CM | POA: Diagnosis not present

## 2020-09-06 DIAGNOSIS — E02 Subclinical iodine-deficiency hypothyroidism: Secondary | ICD-10-CM | POA: Diagnosis not present

## 2020-09-06 DIAGNOSIS — K5909 Other constipation: Secondary | ICD-10-CM | POA: Diagnosis not present

## 2020-09-06 DIAGNOSIS — R278 Other lack of coordination: Secondary | ICD-10-CM | POA: Diagnosis not present

## 2020-09-06 DIAGNOSIS — Z9181 History of falling: Secondary | ICD-10-CM | POA: Diagnosis not present

## 2020-09-06 DIAGNOSIS — R2681 Unsteadiness on feet: Secondary | ICD-10-CM | POA: Diagnosis not present

## 2020-09-07 DIAGNOSIS — K5909 Other constipation: Secondary | ICD-10-CM | POA: Diagnosis not present

## 2020-09-07 DIAGNOSIS — E02 Subclinical iodine-deficiency hypothyroidism: Secondary | ICD-10-CM | POA: Diagnosis not present

## 2020-09-07 DIAGNOSIS — R2681 Unsteadiness on feet: Secondary | ICD-10-CM | POA: Diagnosis not present

## 2020-09-07 DIAGNOSIS — Z9181 History of falling: Secondary | ICD-10-CM | POA: Diagnosis not present

## 2020-09-07 DIAGNOSIS — R278 Other lack of coordination: Secondary | ICD-10-CM | POA: Diagnosis not present

## 2020-09-07 DIAGNOSIS — M6281 Muscle weakness (generalized): Secondary | ICD-10-CM | POA: Diagnosis not present

## 2020-09-07 NOTE — Telephone Encounter (Signed)
Done in error.

## 2020-09-08 DIAGNOSIS — N200 Calculus of kidney: Secondary | ICD-10-CM | POA: Diagnosis not present

## 2020-09-08 LAB — COMPREHENSIVE METABOLIC PANEL
Calcium: 10 (ref 8.7–10.7)
GFR calc Af Amer: 67
GFR calc non Af Amer: 58

## 2020-09-08 LAB — BASIC METABOLIC PANEL
BUN: 15 (ref 4–21)
CO2: 26 — AB (ref 13–22)
Chloride: 105 (ref 99–108)
Creatinine: 0.9 (ref 0.5–1.1)
Glucose: 95
Potassium: 4.1 (ref 3.4–5.3)
Sodium: 141 (ref 137–147)

## 2020-09-09 DIAGNOSIS — E02 Subclinical iodine-deficiency hypothyroidism: Secondary | ICD-10-CM | POA: Diagnosis not present

## 2020-09-09 DIAGNOSIS — R278 Other lack of coordination: Secondary | ICD-10-CM | POA: Diagnosis not present

## 2020-09-09 DIAGNOSIS — E039 Hypothyroidism, unspecified: Secondary | ICD-10-CM | POA: Diagnosis not present

## 2020-09-09 DIAGNOSIS — M154 Erosive (osteo)arthritis: Secondary | ICD-10-CM | POA: Diagnosis not present

## 2020-09-09 DIAGNOSIS — Z299 Encounter for prophylactic measures, unspecified: Secondary | ICD-10-CM | POA: Diagnosis not present

## 2020-09-09 DIAGNOSIS — N390 Urinary tract infection, site not specified: Secondary | ICD-10-CM | POA: Diagnosis not present

## 2020-09-09 DIAGNOSIS — K5909 Other constipation: Secondary | ICD-10-CM | POA: Diagnosis not present

## 2020-09-09 DIAGNOSIS — G8929 Other chronic pain: Secondary | ICD-10-CM | POA: Diagnosis not present

## 2020-09-09 DIAGNOSIS — R279 Unspecified lack of coordination: Secondary | ICD-10-CM | POA: Diagnosis not present

## 2020-09-09 DIAGNOSIS — R0602 Shortness of breath: Secondary | ICD-10-CM | POA: Diagnosis not present

## 2020-09-09 DIAGNOSIS — G20 Parkinson's disease: Secondary | ICD-10-CM | POA: Diagnosis not present

## 2020-09-09 DIAGNOSIS — R441 Visual hallucinations: Secondary | ICD-10-CM | POA: Diagnosis not present

## 2020-09-09 DIAGNOSIS — K22719 Barrett's esophagus with dysplasia, unspecified: Secondary | ICD-10-CM | POA: Diagnosis not present

## 2020-09-09 DIAGNOSIS — I5032 Chronic diastolic (congestive) heart failure: Secondary | ICD-10-CM | POA: Diagnosis not present

## 2020-09-09 DIAGNOSIS — F419 Anxiety disorder, unspecified: Secondary | ICD-10-CM | POA: Diagnosis not present

## 2020-09-09 DIAGNOSIS — D500 Iron deficiency anemia secondary to blood loss (chronic): Secondary | ICD-10-CM | POA: Diagnosis not present

## 2020-09-09 DIAGNOSIS — M6281 Muscle weakness (generalized): Secondary | ICD-10-CM | POA: Diagnosis not present

## 2020-09-09 DIAGNOSIS — F323 Major depressive disorder, single episode, severe with psychotic features: Secondary | ICD-10-CM | POA: Diagnosis not present

## 2020-09-09 DIAGNOSIS — R41841 Cognitive communication deficit: Secondary | ICD-10-CM | POA: Diagnosis not present

## 2020-09-09 DIAGNOSIS — N200 Calculus of kidney: Secondary | ICD-10-CM | POA: Diagnosis not present

## 2020-09-09 DIAGNOSIS — R2681 Unsteadiness on feet: Secondary | ICD-10-CM | POA: Diagnosis not present

## 2020-09-09 DIAGNOSIS — Z9181 History of falling: Secondary | ICD-10-CM | POA: Diagnosis not present

## 2020-09-09 DIAGNOSIS — I350 Nonrheumatic aortic (valve) stenosis: Secondary | ICD-10-CM | POA: Diagnosis not present

## 2020-09-12 ENCOUNTER — Ambulatory Visit: Payer: Medicare Other | Admitting: Podiatry

## 2020-09-12 DIAGNOSIS — G20 Parkinson's disease: Secondary | ICD-10-CM | POA: Diagnosis not present

## 2020-09-12 DIAGNOSIS — R278 Other lack of coordination: Secondary | ICD-10-CM | POA: Diagnosis not present

## 2020-09-12 DIAGNOSIS — N200 Calculus of kidney: Secondary | ICD-10-CM | POA: Diagnosis not present

## 2020-09-12 DIAGNOSIS — R41841 Cognitive communication deficit: Secondary | ICD-10-CM | POA: Diagnosis not present

## 2020-09-12 DIAGNOSIS — Z9181 History of falling: Secondary | ICD-10-CM | POA: Diagnosis not present

## 2020-09-12 DIAGNOSIS — E02 Subclinical iodine-deficiency hypothyroidism: Secondary | ICD-10-CM | POA: Diagnosis not present

## 2020-09-13 ENCOUNTER — Encounter: Payer: Self-pay | Admitting: Internal Medicine

## 2020-09-13 ENCOUNTER — Non-Acute Institutional Stay (SKILLED_NURSING_FACILITY): Payer: Medicare Other | Admitting: Internal Medicine

## 2020-09-13 DIAGNOSIS — D500 Iron deficiency anemia secondary to blood loss (chronic): Secondary | ICD-10-CM | POA: Diagnosis not present

## 2020-09-13 DIAGNOSIS — R441 Visual hallucinations: Secondary | ICD-10-CM

## 2020-09-13 DIAGNOSIS — G20 Parkinson's disease: Secondary | ICD-10-CM

## 2020-09-13 DIAGNOSIS — I5032 Chronic diastolic (congestive) heart failure: Secondary | ICD-10-CM

## 2020-09-13 DIAGNOSIS — E039 Hypothyroidism, unspecified: Secondary | ICD-10-CM | POA: Diagnosis not present

## 2020-09-13 DIAGNOSIS — R2681 Unsteadiness on feet: Secondary | ICD-10-CM | POA: Diagnosis not present

## 2020-09-13 DIAGNOSIS — N390 Urinary tract infection, site not specified: Secondary | ICD-10-CM | POA: Diagnosis not present

## 2020-09-13 DIAGNOSIS — F323 Major depressive disorder, single episode, severe with psychotic features: Secondary | ICD-10-CM

## 2020-09-13 NOTE — Progress Notes (Signed)
Location:  [facility] Room Number: 45 Place of Service:  SNF [phone])  Provider: [name] MD  Code Status: DNR Goals of Care:  Advanced Directives 09/13/2020  Does Patient Have a Medical Advance Directive? Yes  Type of Advance Directive Out of facility DNR (pink MOST or yellow form);Living will  Does patient want to make changes to medical advance directive? No - Patient declined  Copy of [place] in Chart? -  Would patient like information on creating a medical advance directive? -  Pre-existing out of facility DNR order (yellow form or pink MOST form) Yellow form placed in chart (order not valid for inpatient use)     Chief Complaint  Patient presents with  . Medical Management of Chronic Issues    HPI: Patient is a 86 y.o. female seen today for medical management of chronic diseases.    Patient has history of Parkinson disease with visual hallucinations,Erosive Arthritis,peripheral neuropathy, hypothyroid, GERD, osteoporosis, hyperlipidemia, gout, dyspnea on exertionand Aortic stenosis  Lives in SNF  Has Number of Issues Dysuria Her Dysuria had resolved with Keflex before but she has noticed again today Just one episode. No Fever No Lower abdominal Discomfort. No Hematuria  Erosive arthritis Also had c/o Swelling and Pain in Some of her Fingers On Plaquenil Parkinson Disease Continues to have Hallucinations. Mostly in the Evening and at night But don't bother there too much Severe AS Active symptoms of SOB and Fatigue. Per Cardiology Not Candidate for TAVR Continue Palliative approach Depression Continue sot be depressed as she is unable to go back in [place]  Past Medical History:  Diagnosis Date  . Abnormal nuclear stress test   . Actinic cheilitis 04/23/2019  . Anemia    iron deficiency   . Anxiety   . Aortic stenosis    severe by echo 2021   . Arthritis   . Broken arm    right   . Chronic diastolic CHF  (congestive heart failure) ([place])   . Chronic pain   . Cognitive communication deficit   . Constipation   . Depression   . Erosive (osteo)arthritis 03/09/2020  . Family history of adverse reaction to anesthesia    pt. states sister vomits  . Fibromyalgia   . GERD (gastroesophageal reflux disease)   . H/O hiatal hernia   . Hallucinations, visual 07/21/2018   03/10/20 psych consult.   . Hematuria 05/18/2020  . History of bronchitis   . History of kidney stones   . Hypercholesterolemia   . Hypertension    dr t turner  . Hypothyroidism   . Memory loss 04/21/2018   08/08/18 CT head no traumatic findings, atrophy.  09/27/19 MMSE 25/30, failed the clock drawing  . Microhematuria 03/24/2015  . Neuropathy   . Osteoporosis   . Parkinson disease ([place])   . PVD (peripheral vascular disease) ([facility])    99% stenosis of left anteiror tibial artery, mod stenosis of left distal SFA and popliteal artery followed by [name]  . RBBB    noted on EKG 2018  . Restless leg syndrome   . Sepsis ([place])    hx of due to [facility]  . Septic shock ([place]) 04/19/2020  . Shortness of breath    with exertion - chronic   . Sjogren's disease ([place])   . Sjogren's syndrome [facility]) 02/04/2018   04/16/19 rheumatology: Erosive OA of hands, Sjogrens syndrome, low back pain at multiple sites, recurrent kidney stones, age related osteoporosis w/o current  pathological fracture, f/u 6 months.   . SOB (shortness of breath)    chronic due to diastolic dysfunction, deconditioning, obesity  . Spinal stenosis    lumbar region   . Tremor   . Unstable gait 04/21/2018  . Unsteadiness on feet   . Urinary frequency 09/18/2018  . Urinary tract infection   . Visual hallucinations     Past Surgical History:  Procedure Laterality Date  . ABDOMINAL HYSTERECTOMY    . BACK SURGERY     4 back surgeries,   lumbar fusion  . CARDIAC CATHETERIZATION  2006   normal  . CYSTOSCOPY/URETEROSCOPY/HOLMIUM LASER/STENT PLACEMENT Left 01/06/2019   Procedure:  CYSTOSCOPY/RETROGRADE/URETEROSCOPY/HOLMIUM LASER/BASKET RETRIEVAL/STENT PLACEMENT;  Surgeon: Ceasar Mons, MD;  Location: [facility];  Service: Urology;  Laterality: Left;  . CYSTOSCOPY/URETEROSCOPY/HOLMIUM LASER/STENT PLACEMENT Bilateral 05/24/2020   Procedure: CYSTOSCOPY/RETROGRADE/URETEROSCOPY/HOLMIUM LASER/STENT PLACEMENT;  Surgeon: Ceasar Mons, MD;  Location: [facility];  Service: Urology;  Laterality: Bilateral;  . EYE SURGERY Bilateral    cateracts  . falls     variious fall, broken wrist,and toes  . FRACTURE SURGERY Right April 2016   Wrist, Pt. fell  . IR NEPHROSTOMY PLACEMENT RIGHT  04/19/2020  . kidney stone removal Left 01/20/2019  . RIGHT/LEFT HEART CATH AND CORONARY ANGIOGRAPHY N/A 02/05/2018   Procedure: RIGHT/LEFT HEART CATH AND CORONARY ANGIOGRAPHY;  Surgeon: Troy Sine, MD;  Location: [facility] CV LAB;  Service: Cardiovascular;  Laterality: N/A;  . SPINAL CORD STIMULATOR INSERTION N/A 09/09/2015   Procedure: LUMBAR SPINAL CORD STIMULATOR INSERTION;  Surgeon: Clydell Hakim, MD;  Location: [facility];  Service: Neurosurgery;  Laterality: N/A;  LUMBAR SPINAL CORD STIMULATOR INSERTION  . SPINE SURGERY  April 2013   Back X's 4  . TOTAL HIP ARTHROPLASTY     right  . WRIST FRACTURE SURGERY Bilateral     Allergies  Allergen Reactions  . Adrenalone   . Lactose Other (See Comments)    abd pain, lactose intolerant  . Morphine Other (See Comments)  . Sulfa Antibiotics Other (See Comments)    Headache, very sick  . Codeine Other (See Comments)    Reaction:  Headaches and nightmares   . Lactose Intolerance (Gi) Nausea And Vomiting  . Latex Rash  . Lyrica [Pregabalin] Swelling and Other (See Comments)    Reaction:  Leg swelling  . Other Other (See Comments)    Pt states that pain medications give her nightmares.    . Plaquenil [Hydroxychloroquine Sulfate] Other (See Comments)    Reaction:  GI upset   . Reglan [Metoclopramide] Other (See Comments)     Reaction:  GI upset   . Requip [Ropinirole Hcl] Other (See Comments)    Reaction:  GI upset   . Septra [Sulfamethoxazole-Trimethoprim] Nausea And Vomiting  . Shellfish Allergy Nausea And Vomiting    Outpatient Encounter Medications as of 09/13/2020  Medication Sig  . acetaminophen (TYLENOL) 500 MG tablet Take 500 mg by mouth daily as needed for mild pain. not to exceed 3 gm in 24 hours. As needed for arthritis pain  . acetaminophen (TYLENOL) 500 MG tablet Take 500 mg by mouth 2 (two) times daily.  Marland Kitchen albuterol (PROVENTIL HFA;VENTOLIN HFA) 108 (90 Base) MCG/ACT inhaler Inhale 2 puffs into the lungs every 6 (six) hours as needed for wheezing or shortness of breath.  Marland Kitchen aspirin EC 81 MG tablet Take 81 mg by mouth daily.   . Calcium Carb-Cholecalciferol (CALCIUM 600/VITAMIN D3) 600-800 MG-UNIT TABS Take 1 tablet by mouth daily.  Marland Kitchen  carbidopa-levodopa (SINEMET IR) 25-100 MG tablet Take 2 tablets by mouth in the morning and at bedtime.   . cephALEXin (KEFLEX) 250 MG capsule Take 250 mg by mouth daily.   . Cholecalciferol (VITAMIN D) 50 MCG (2000 UT) tablet Take 2,000 Units by mouth daily.  Marland Kitchen docusate sodium (COLACE) 100 MG capsule Take 100 mg by mouth at bedtime.  Marland Kitchen ezetimibe (ZETIA) 10 MG tablet TAKE 1 TABLET ONCE DAILY.  . famotidine (PEPCID) 20 MG tablet Take 20 mg by mouth daily.  . Ferrous Sulfate (SLOW FE) 142 (45 Fe) MG TBCR Take 1 tablet by mouth. Daily  . furosemide (LASIX) 20 MG tablet Take 20 mg by mouth daily.  . hydroxychloroquine (PLAQUENIL) 200 MG tablet Take 200 mg by mouth daily.  Marland Kitchen levothyroxine (SYNTHROID, LEVOTHROID) 50 MCG tablet Take 50 mcg by mouth daily before breakfast.   . lubiprostone (AMITIZA) 24 MCG capsule Take 24 mcg by mouth 2 (two) times daily with a meal.   . Multiple Vitamins-Minerals (MULTIVITAMIN WITH MINERALS) tablet Take 1 tablet by mouth daily.  . Multiple Vitamins-Minerals (PRESERVISION AREDS 2 PO) Take 1 tablet by mouth daily.  Marland Kitchen nystatin (MYCOSTATIN/NYSTOP)  powder Apply topically 2 (two) times daily as needed.  . pantoprazole (PROTONIX) 40 MG tablet Take 40 mg by mouth daily.  Vladimir Faster Glycol-Propyl Glycol (SYSTANE) 0.4-0.3 % SOLN Apply 1 drop to eye 2 (two) times daily as needed (dry eyes).   . potassium chloride (KLOR-CON) 10 MEQ tablet Take 10 mEq by mouth 2 (two) times daily.   . pramipexole (MIRAPEX) 1 MG tablet Take 1 mg by mouth 2 (two) times daily.  . QUEtiapine (SEROQUEL) 25 MG tablet Take 12.5 mg by mouth every morning. 1/2 tablet to = 12.5 mg QAM  . QUEtiapine (SEROQUEL) 25 MG tablet Take 25 mg by mouth daily.  . rosuvastatin (CRESTOR) 20 MG tablet Take 20 mg by mouth daily.  Marland Kitchen saccharomyces boulardii (FLORASTOR) 250 MG capsule Take 250 mg by mouth daily.   Marland Kitchen senna-docusate (SENOKOT-S) 8.6-50 MG tablet Take 1 tablet by mouth at bedtime.   . sertraline (ZOLOFT) 50 MG tablet Take 75 mg by mouth at bedtime. 1-1/2 tablets to = 75 mg  . Skin Protectants, Misc. (EUCERIN) cream Apply 1 application topically daily.   Marland Kitchen Spacer/Aero-Holding Chambers (AEROCHAMBER MV) inhaler by Other route. Use as instructed as needed  . [DISCONTINUED] carbamide peroxide (DEBROX) 6.5 % OTIC solution 5 drops at bedtime.  . [DISCONTINUED] Cholecalciferol 50 MCG (2000 UT) TABS Take 1 tablet by mouth daily.   No facility-administered encounter medications on file as of 09/13/2020.    Review of Systems:  Review of Systems  Constitutional: Negative.   HENT: Negative.   Respiratory: Negative.   Cardiovascular: Positive for leg swelling.  Gastrointestinal: Negative.   Genitourinary: Positive for dysuria and urgency.  Musculoskeletal: Positive for arthralgias, gait problem, joint swelling and myalgias.  Skin: Negative.   Neurological: Positive for weakness.  Psychiatric/Behavioral: Positive for dysphoric mood and hallucinations. The patient is nervous/anxious.     Health Maintenance  Topic Date Due  . INFLUENZA VACCINE  07/10/2020  . TETANUS/TDAP   08/31/2025  . DEXA SCAN  Completed  . COVID-19 Vaccine  Completed  . PNA vac Low Risk Adult  Completed    Physical Exam: Vitals:   09/13/20 1538  BP: 116/70  Pulse: 84  Resp: 16  Temp: 97.7 F (36.5 C)  SpO2: 98%  Weight: 138 lb 8 oz (62.8 kg)  Height: 5\' 2"  (1.575  m)   Body mass index is 25.33 kg/m. Physical Exam Vitals reviewed.  Constitutional:      Appearance: Normal appearance.  HENT:     Head: Normocephalic.     Nose: Nose normal.     Mouth/Throat:     Mouth: Mucous membranes are moist.     Pharynx: Oropharynx is clear.  Eyes:     Pupils: Pupils are equal, round, and reactive to light.  Cardiovascular:     Rate and Rhythm: Normal rate and regular rhythm.     Heart sounds: Murmur heard.   Pulmonary:     Effort: Pulmonary effort is normal.     Breath sounds: Normal breath sounds.  Abdominal:     General: Abdomen is flat. Bowel sounds are normal.     Palpations: Abdomen is soft.  Musculoskeletal:        General: Swelling present.     Cervical back: Neck supple.  Skin:    General: Skin is dry.  Neurological:     General: No focal deficit present.     Mental Status: She is alert and oriented to person, place, and time.  Psychiatric:        Mood and Affect: Mood normal.        Thought Content: Thought content normal.     Labs reviewed: Basic Metabolic Panel: Recent Labs    04/21/20 0325 04/22/20 0605 04/22/20 0903 04/23/20 0350 04/23/20 0350 04/24/20 0423 04/24/20 0423 04/25/20 0427 05/24/20 1015 08/04/20 0000  NA 143  --    < > 144   < > 145   < > 146* 141 141  K 3.4*  --    < > 3.1*   < > 3.0*   < > 3.8 3.5 3.9  CL 115*  --    < > 119*   < > 115*   < > 115* 107 106  CO2 20*  --    < > 21*   < > 22   < > 26 24 25*  GLUCOSE 129*  --    < > 80   < > 85  --  88 100*  --   BUN 35*  --    < > 33*   < > 26*   < > 16 12 13   CREATININE 1.23*  --    < > 1.01*   < > 0.78   < > 0.81 0.81 1.0  CALCIUM 7.2*  --    < > 7.2*   < > 7.0*   < > 7.6* 8.9 9.5   MG 3.0* 2.6*  --  2.5*  --   --   --   --   --   --   PHOS 3.2 2.6  --  2.3*  --   --   --   --   --   --   TSH  --   --   --   --   --   --   --   --   --  1.21   < > = values in this interval not displayed.   Liver Function Tests: Recent Labs    04/20/20 0455 04/21/20 0325 04/24/20 0423  AST 117* 50* 32  ALT 18 8 7   ALKPHOS 95 95 77  BILITOT 0.5 0.5 0.6  PROT 5.1* 5.4* 4.7*  ALBUMIN 2.3* 2.3* 2.3*   No results for input(s): LIPASE, AMYLASE in the last 8760 hours. No results for input(s): AMMONIA in  the last 8760 hours. CBC: Recent Labs    04/24/20 0423 04/24/20 0423 04/25/20 0427 04/26/20 0000 05/24/20 1015 06/21/20 0000 06/21/20 0000 06/23/20 0000 06/30/20 0000 08/04/20 0000  WBC 14.5*   < > 13.8*   < > 7.8 7.6   < > 7.5 9.1 6.2  NEUTROABS  --   --   --    < >  --  5,236  --   --  6,406 3,677  HGB 8.8*   < > 9.4*   < > 9.8* 8.9*   < > 8.8* 8.7* 8.9*  HCT 27.1*   < > 29.7*   < > 30.9* 28*   < > 27* 27* 28*  MCV 97.5  --  96.7  --  98.4  --   --   --   --   --   PLT 194   < > 226   < > 286 314   < > 325 443* 354   < > = values in this interval not displayed.   Lipid Panel: Recent Labs    08/04/20 0000  CHOL 110  HDL 50  LDLCALC 37  TRIG 147   No results found for: HGBA1C  Procedures since last visit: No results found.  Assessment/Plan Recurrent UTI and RenalCalculi ON Chronic Keflex C/O Some Dysuria today Will continue to watch Encourage PO Fluids If Not better will check UA Follows with Urology for her Calculi. They are planning to do 24 hours Urine Collection Also now on Potassium citrate Erosive Arhtirits On Plaquenil D/w her husband about Follow up with Dr Clydie Braun to have swelling in her Fingers. Severe AS Per Cardiology she [place] not good candidate for TAVR. At this time her symptoms would be managed by Palliative Approcah   Parkinson's disease ([place]) Doing well with Sinemet and Mirapex  Iron deficiency anemia due to chronic  blood loss On iron Repeat CBC  Chronic diastolic CHF (congestive heart failure) ([facility]) On Lasix and Potassium Repeat BMP Hallucinations, visual Continues to have some On Seroquel Depression, psychotic ([place]) On Zoloft Hypothyroidism, unspecified type Repeat TSH Unstable gait Continues to work with therapy Cannot get up without Assist    Labs/tests ordered:  CBC,CMP,TSH, Lipid Panel

## 2020-09-14 DIAGNOSIS — Z9181 History of falling: Secondary | ICD-10-CM | POA: Diagnosis not present

## 2020-09-14 DIAGNOSIS — R278 Other lack of coordination: Secondary | ICD-10-CM | POA: Diagnosis not present

## 2020-09-14 DIAGNOSIS — E02 Subclinical iodine-deficiency hypothyroidism: Secondary | ICD-10-CM | POA: Diagnosis not present

## 2020-09-14 DIAGNOSIS — N200 Calculus of kidney: Secondary | ICD-10-CM | POA: Diagnosis not present

## 2020-09-14 DIAGNOSIS — R41841 Cognitive communication deficit: Secondary | ICD-10-CM | POA: Diagnosis not present

## 2020-09-14 DIAGNOSIS — N390 Urinary tract infection, site not specified: Secondary | ICD-10-CM | POA: Diagnosis not present

## 2020-09-14 DIAGNOSIS — G20 Parkinson's disease: Secondary | ICD-10-CM | POA: Diagnosis not present

## 2020-09-15 DIAGNOSIS — R278 Other lack of coordination: Secondary | ICD-10-CM | POA: Diagnosis not present

## 2020-09-15 DIAGNOSIS — E02 Subclinical iodine-deficiency hypothyroidism: Secondary | ICD-10-CM | POA: Diagnosis not present

## 2020-09-15 DIAGNOSIS — Z9181 History of falling: Secondary | ICD-10-CM | POA: Diagnosis not present

## 2020-09-15 DIAGNOSIS — R41841 Cognitive communication deficit: Secondary | ICD-10-CM | POA: Diagnosis not present

## 2020-09-15 DIAGNOSIS — G20 Parkinson's disease: Secondary | ICD-10-CM | POA: Diagnosis not present

## 2020-09-15 DIAGNOSIS — N200 Calculus of kidney: Secondary | ICD-10-CM | POA: Diagnosis not present

## 2020-09-19 DIAGNOSIS — M154 Erosive (osteo)arthritis: Secondary | ICD-10-CM | POA: Diagnosis not present

## 2020-09-19 DIAGNOSIS — M2550 Pain in unspecified joint: Secondary | ICD-10-CM | POA: Diagnosis not present

## 2020-09-19 DIAGNOSIS — M3500 Sicca syndrome, unspecified: Secondary | ICD-10-CM | POA: Diagnosis not present

## 2020-09-19 DIAGNOSIS — M810 Age-related osteoporosis without current pathological fracture: Secondary | ICD-10-CM | POA: Diagnosis not present

## 2020-09-20 DIAGNOSIS — G20 Parkinson's disease: Secondary | ICD-10-CM | POA: Diagnosis not present

## 2020-09-20 DIAGNOSIS — R278 Other lack of coordination: Secondary | ICD-10-CM | POA: Diagnosis not present

## 2020-09-20 DIAGNOSIS — Z9181 History of falling: Secondary | ICD-10-CM | POA: Diagnosis not present

## 2020-09-20 DIAGNOSIS — E02 Subclinical iodine-deficiency hypothyroidism: Secondary | ICD-10-CM | POA: Diagnosis not present

## 2020-09-20 DIAGNOSIS — N200 Calculus of kidney: Secondary | ICD-10-CM | POA: Diagnosis not present

## 2020-09-20 DIAGNOSIS — R41841 Cognitive communication deficit: Secondary | ICD-10-CM | POA: Diagnosis not present

## 2020-09-21 DIAGNOSIS — Z9181 History of falling: Secondary | ICD-10-CM | POA: Diagnosis not present

## 2020-09-21 DIAGNOSIS — R41841 Cognitive communication deficit: Secondary | ICD-10-CM | POA: Diagnosis not present

## 2020-09-21 DIAGNOSIS — G20 Parkinson's disease: Secondary | ICD-10-CM | POA: Diagnosis not present

## 2020-09-21 DIAGNOSIS — R278 Other lack of coordination: Secondary | ICD-10-CM | POA: Diagnosis not present

## 2020-09-21 DIAGNOSIS — E02 Subclinical iodine-deficiency hypothyroidism: Secondary | ICD-10-CM | POA: Diagnosis not present

## 2020-09-21 DIAGNOSIS — N200 Calculus of kidney: Secondary | ICD-10-CM | POA: Diagnosis not present

## 2020-09-22 DIAGNOSIS — Z9181 History of falling: Secondary | ICD-10-CM | POA: Diagnosis not present

## 2020-09-22 DIAGNOSIS — N200 Calculus of kidney: Secondary | ICD-10-CM | POA: Diagnosis not present

## 2020-09-22 DIAGNOSIS — G20 Parkinson's disease: Secondary | ICD-10-CM | POA: Diagnosis not present

## 2020-09-22 DIAGNOSIS — R278 Other lack of coordination: Secondary | ICD-10-CM | POA: Diagnosis not present

## 2020-09-22 DIAGNOSIS — E02 Subclinical iodine-deficiency hypothyroidism: Secondary | ICD-10-CM | POA: Diagnosis not present

## 2020-09-22 DIAGNOSIS — R41841 Cognitive communication deficit: Secondary | ICD-10-CM | POA: Diagnosis not present

## 2020-09-26 DIAGNOSIS — N200 Calculus of kidney: Secondary | ICD-10-CM | POA: Diagnosis not present

## 2020-09-26 DIAGNOSIS — G20 Parkinson's disease: Secondary | ICD-10-CM | POA: Diagnosis not present

## 2020-09-26 DIAGNOSIS — E02 Subclinical iodine-deficiency hypothyroidism: Secondary | ICD-10-CM | POA: Diagnosis not present

## 2020-09-26 DIAGNOSIS — R41841 Cognitive communication deficit: Secondary | ICD-10-CM | POA: Diagnosis not present

## 2020-09-26 DIAGNOSIS — R278 Other lack of coordination: Secondary | ICD-10-CM | POA: Diagnosis not present

## 2020-09-26 DIAGNOSIS — Z9181 History of falling: Secondary | ICD-10-CM | POA: Diagnosis not present

## 2020-10-04 DIAGNOSIS — D649 Anemia, unspecified: Secondary | ICD-10-CM | POA: Diagnosis not present

## 2020-10-04 DIAGNOSIS — E785 Hyperlipidemia, unspecified: Secondary | ICD-10-CM | POA: Diagnosis not present

## 2020-10-04 DIAGNOSIS — R6889 Other general symptoms and signs: Secondary | ICD-10-CM | POA: Diagnosis not present

## 2020-10-04 DIAGNOSIS — E039 Hypothyroidism, unspecified: Secondary | ICD-10-CM | POA: Diagnosis not present

## 2020-10-05 LAB — BASIC METABOLIC PANEL
BUN: 17 (ref 4–21)
CO2: 25 — AB (ref 13–22)
Chloride: 107 (ref 99–108)
Creatinine: 0.9 (ref 0.5–1.1)
Glucose: 88
Potassium: 4.2 (ref 3.4–5.3)
Sodium: 142 (ref 137–147)

## 2020-10-05 LAB — HEPATIC FUNCTION PANEL
ALT: 15 (ref 7–35)
AST: 19 (ref 13–35)
Alkaline Phosphatase: 62 (ref 25–125)
Bilirubin, Total: 0.3

## 2020-10-05 LAB — CBC AND DIFFERENTIAL
HCT: 30 — AB (ref 36–46)
Hemoglobin: 9.6 — AB (ref 12.0–16.0)
Neutrophils Absolute: 2328
Platelets: 288 (ref 150–399)
WBC: 4.8

## 2020-10-05 LAB — LIPID PANEL
Cholesterol: 105 (ref 0–200)
HDL: 46 (ref 35–70)
LDL Cholesterol: 38
Triglycerides: 119 (ref 40–160)

## 2020-10-05 LAB — COMPREHENSIVE METABOLIC PANEL
Albumin: 3.5 (ref 3.5–5.0)
Calcium: 9.6 (ref 8.7–10.7)
GFR calc Af Amer: 66
GFR calc non Af Amer: 57
Globulin: 2.2

## 2020-10-05 LAB — CBC: RBC: 3.31 — AB (ref 3.87–5.11)

## 2020-10-05 LAB — TSH: TSH: 1.04 (ref 0.41–5.90)

## 2020-10-06 ENCOUNTER — Other Ambulatory Visit: Payer: Self-pay

## 2020-10-06 ENCOUNTER — Ambulatory Visit (INDEPENDENT_AMBULATORY_CARE_PROVIDER_SITE_OTHER): Payer: Medicare Other | Admitting: Podiatry

## 2020-10-06 DIAGNOSIS — M7742 Metatarsalgia, left foot: Secondary | ICD-10-CM

## 2020-10-06 DIAGNOSIS — R609 Edema, unspecified: Secondary | ICD-10-CM

## 2020-10-06 DIAGNOSIS — M79675 Pain in left toe(s): Secondary | ICD-10-CM

## 2020-10-06 DIAGNOSIS — L821 Other seborrheic keratosis: Secondary | ICD-10-CM | POA: Diagnosis not present

## 2020-10-06 DIAGNOSIS — L309 Dermatitis, unspecified: Secondary | ICD-10-CM | POA: Diagnosis not present

## 2020-10-06 DIAGNOSIS — M79674 Pain in right toe(s): Secondary | ICD-10-CM

## 2020-10-06 DIAGNOSIS — B351 Tinea unguium: Secondary | ICD-10-CM

## 2020-10-07 NOTE — Progress Notes (Signed)
Subjective: 86 y.o. returns the office today for painful, elongated, thickened toenails which she cannot trim herself. Denies any redness or drainage around the nails. She also has been getting swelling to her feet she has been told to wear compression socks.  She still has some discomfort in the balls of her feet as well.  No recent injury or falls.  Denies any acute changes since last appointment and no new complaints today. Denies any systemic complaints such as fevers, chills, nausea, vomiting.   PCP: [name], MD   Objective: AAO 3, NAD DP/PT pulses palpable, CRT less than 3 seconds Nails hypertrophic, dystrophic, elongated, brittle, discolored 10. There is tenderness overlying the nails 1-5 bilaterally except for the left hallux toenail does not present. There is no surrounding erythema or drainage along the nail sites. No open lesions or pre-ulcerative lesions are identified. Tenderness submetatarsal 1 through 5 bilaterally with prominence of metatarsal heads are atrophy of the fat pad. Chronic bilateral lower extremity edema present. No other areas of tenderness bilateral lower extremities. No overlying edema, erythema, increased warmth. No pain with calf compression, warmth, erythema.  Assessment: Patient presents with symptomatic onychomycosis, metatarsalgia, swelling  Plan: -Treatment options including alternatives, risks, complications were discussed -Nails sharply debrided 9 without complication/bleeding. -Recommended metatarsal offloading pads.  Continue compression socks, elevation. -Discussed daily foot inspection. If there are any changes, to call the office immediately.  -Follow-up in 3 months or sooner if any problems are to arise. In the meantime, encouraged to call the office with any questions, concerns, changes symptoms.  Celesta Gentile, DPM

## 2020-10-12 ENCOUNTER — Encounter: Payer: Self-pay | Admitting: Nurse Practitioner

## 2020-10-12 ENCOUNTER — Non-Acute Institutional Stay (SKILLED_NURSING_FACILITY): Payer: Medicare Other | Admitting: Nurse Practitioner

## 2020-10-12 DIAGNOSIS — E02 Subclinical iodine-deficiency hypothyroidism: Secondary | ICD-10-CM

## 2020-10-12 DIAGNOSIS — N390 Urinary tract infection, site not specified: Secondary | ICD-10-CM

## 2020-10-12 DIAGNOSIS — I5032 Chronic diastolic (congestive) heart failure: Secondary | ICD-10-CM | POA: Diagnosis not present

## 2020-10-12 DIAGNOSIS — D500 Iron deficiency anemia secondary to blood loss (chronic): Secondary | ICD-10-CM | POA: Diagnosis not present

## 2020-10-12 DIAGNOSIS — K219 Gastro-esophageal reflux disease without esophagitis: Secondary | ICD-10-CM

## 2020-10-12 DIAGNOSIS — M154 Erosive (osteo)arthritis: Secondary | ICD-10-CM

## 2020-10-12 DIAGNOSIS — I350 Nonrheumatic aortic (valve) stenosis: Secondary | ICD-10-CM | POA: Diagnosis not present

## 2020-10-12 DIAGNOSIS — G20 Parkinson's disease: Secondary | ICD-10-CM | POA: Diagnosis not present

## 2020-10-12 DIAGNOSIS — F323 Major depressive disorder, single episode, severe with psychotic features: Secondary | ICD-10-CM | POA: Diagnosis not present

## 2020-10-12 DIAGNOSIS — K5909 Other constipation: Secondary | ICD-10-CM

## 2020-10-12 NOTE — Progress Notes (Signed)
Location:   SNF [place] Room Number: 45 Place of Service:  SNF (31) Provider: [facility] Neetu Carrozza NP  [name], MD  Patient Care Team: [name], MD as PCP - General (Internal Medicine) [name], MD as PCP - Cardiology (Cardiology) [name], MD (Neurosurgery) [name], MD (Neurology) [name], MD (Inactive) as Consulting Physician (Gastroenterology) [name], MD as Consulting Physician (Urology) Harriet Sutphen X, NP as Nurse Practitioner (Internal Medicine)  Extended Emergency Contact Information Primary Emergency Contact: [facility] Address: [address]          [place], [place] [address] [name] of [place] Phone: [phone] Mobile Phone: [phone] Relation: Spouse Secondary Emergency Contact: [name] Mobile Phone: [phone] Relation: Daughter Preferred language: [name] Interpreter needed? No  Code Status:  DNR Goals of care: Advanced Directive information Advanced Directives 10/12/2020  Does Patient Have a Medical Advance Directive? Yes  Type of Advance Directive Living will;Out of facility DNR (pink MOST or yellow form)  Does patient want to make changes to medical advance directive? No - Patient declined  Copy of [place] in Chart? -  Would patient like information on creating a medical advance directive? -  Pre-existing out of facility DNR order (yellow form or pink MOST form) Yellow form placed in chart (order not valid for inpatient use)     Chief Complaint  Patient presents with  . Medical Management of Chronic Issues    Routine follow up visit    HPI:  Pt is a 86 y.o. female seen today for medical management of chronic diseases.       Hx of rena lithiasis, recurrent UTI, urosepsis, takes Keflex for suppression therapy, f/u Urology GERD, takes Pantoprazole 40mg  qd, Famotidine 20mg  qd. Parkinson's, takes MiraPex 1mg  bid, Sinemet  25/100 II bid.  Depression/hallucination, takes Sertraline 75mg  qd, Quetiapine 25mg  qd, 12.5mg  qd Constipation, takes Senokot S I qd, Amitiza 36mcg bid, Colace qd Hypothyroidism, takes Levothyroxine 77mcg qd. TSH 1.04 10/04/20 Erosive arthritis, takes Plaquenil 200mg  qd, Tylenol 500mg  bid, f/u Rheumatology.  CHF/trace edema BLE, takes Furosemide 20mg  qd, cardiology, severe aortic valve stenosis.Bun/creat 17/0.91 10/04/20 Anemia, takes Fe, Hgb 9.6 10/04/20  AS Cardiology palliative approach      Past Medical History:  Diagnosis Date  . Abnormal nuclear stress test   . Actinic cheilitis 04/23/2019  . Anemia    iron deficiency   . Anxiety   . Aortic stenosis    severe by echo 2021   . Arthritis   . Broken arm    right   . Chronic diastolic CHF (congestive heart failure) ([place])   . Chronic pain   . Cognitive communication deficit   . Constipation   . Depression   . Erosive (osteo)arthritis 03/09/2020  . Family history of adverse reaction to anesthesia    pt. states sister vomits  . Fibromyalgia   . GERD (gastroesophageal reflux disease)   . H/O hiatal hernia   . Hallucinations, visual 07/21/2018   03/10/20 psych consult.   . Hematuria 05/18/2020  . History of bronchitis   . History of kidney stones   . Hypercholesterolemia   . Hypertension    dr t turner  . Hypothyroidism   . Memory loss 04/21/2018   08/08/18 CT head no traumatic findings, atrophy.  09/27/19 MMSE 25/30, failed the clock drawing  . Microhematuria 03/24/2015  . Neuropathy   . Osteoporosis   . Parkinson disease ([place])   . PVD (peripheral  vascular disease) ([place])    99% stenosis of left anteiror tibial artery, mod stenosis of left distal SFA and popliteal artery followed by [name]  . RBBB    noted on EKG 2018  . Restless leg syndrome   . Sepsis ([place])    hx of due to [facility]  . Septic shock ([place]) 04/19/2020  . Shortness of breath     with exertion - chronic   . Sjogren's disease ([place])   . Sjogren's syndrome [facility]) 02/04/2018   04/16/19 rheumatology: Erosive OA of hands, Sjogrens syndrome, low back pain at multiple sites, recurrent kidney stones, age related osteoporosis w/o current pathological fracture, f/u 6 months.   . SOB (shortness of breath)    chronic due to diastolic dysfunction, deconditioning, obesity  . Spinal stenosis    lumbar region   . Tremor   . Unstable gait 04/21/2018  . Unsteadiness on feet   . Urinary frequency 09/18/2018  . Urinary tract infection   . Visual hallucinations    Past Surgical History:  Procedure Laterality Date  . ABDOMINAL HYSTERECTOMY    . BACK SURGERY     4 back surgeries,   lumbar fusion  . CARDIAC CATHETERIZATION  2006   normal  . CYSTOSCOPY/URETEROSCOPY/HOLMIUM LASER/STENT PLACEMENT Left 01/06/2019   Procedure: CYSTOSCOPY/RETROGRADE/URETEROSCOPY/HOLMIUM LASER/BASKET RETRIEVAL/STENT PLACEMENT;  Surgeon: Ceasar Mons, MD;  Location: [facility]S;  Service: Urology;  Laterality: Left;  . CYSTOSCOPY/URETEROSCOPY/HOLMIUM LASER/STENT PLACEMENT Bilateral 05/24/2020   Procedure: CYSTOSCOPY/RETROGRADE/URETEROSCOPY/HOLMIUM LASER/STENT PLACEMENT;  Surgeon: Ceasar Mons, MD;  Location: [facility];  Service: Urology;  Laterality: Bilateral;  . EYE SURGERY Bilateral    cateracts  . falls     variious fall, broken wrist,and toes  . FRACTURE SURGERY Right April 2016   Wrist, Pt. fell  . IR NEPHROSTOMY PLACEMENT RIGHT  04/19/2020  . kidney stone removal Left 01/20/2019  . RIGHT/LEFT HEART CATH AND CORONARY ANGIOGRAPHY N/A 02/05/2018   Procedure: RIGHT/LEFT HEART CATH AND CORONARY ANGIOGRAPHY;  Surgeon: Troy Sine, MD;  Location: [place] CV LAB;  Service: Cardiovascular;  Laterality: N/A;  . SPINAL CORD STIMULATOR INSERTION N/A 09/09/2015   Procedure: LUMBAR SPINAL CORD STIMULATOR INSERTION;  Surgeon: Clydell Hakim, MD;  Location: [place] NEURO ORS;  Service: Neurosurgery;   Laterality: N/A;  LUMBAR SPINAL CORD STIMULATOR INSERTION  . SPINE SURGERY  April 2013   Back X's 4  . TOTAL HIP ARTHROPLASTY     right  . WRIST FRACTURE SURGERY Bilateral     Allergies  Allergen Reactions  . Adrenalone   . Lactose Other (See Comments)    abd pain, lactose intolerant  . Morphine Other (See Comments)  . Sulfa Antibiotics Other (See Comments)    Headache, very sick  . Codeine Other (See Comments)    Reaction:  Headaches and nightmares   . Lactose Intolerance (Gi) Nausea And Vomiting  . Latex Rash  . Lyrica [Pregabalin] Swelling and Other (See Comments)    Reaction:  Leg swelling  . Other Other (See Comments)    Pt states that pain medications give her nightmares.    . Plaquenil [Hydroxychloroquine Sulfate] Other (See Comments)    Reaction:  GI upset   . Reglan [Metoclopramide] Other (See Comments)    Reaction:  GI upset   . Requip [Ropinirole Hcl] Other (See Comments)    Reaction:  GI upset   . Septra [Sulfamethoxazole-Trimethoprim] Nausea And Vomiting  . Shellfish Allergy Nausea And Vomiting    Allergies as of 10/12/2020  Reactions   Adrenalone    Lactose Other (See Comments)   abd pain, lactose intolerant   Morphine Other (See Comments)   Sulfa Antibiotics Other (See Comments)   Headache, very sick   Codeine Other (See Comments)   Reaction:  Headaches and nightmares    Lactose Intolerance (gi) Nausea And Vomiting   Latex Rash   Lyrica [pregabalin] Swelling, Other (See Comments)   Reaction:  Leg swelling   Other Other (See Comments)   Pt states that pain medications give her nightmares.     Plaquenil [hydroxychloroquine Sulfate] Other (See Comments)   Reaction:  GI upset    Reglan [metoclopramide] Other (See Comments)   Reaction:  GI upset    Requip [ropinirole Hcl] Other (See Comments)   Reaction:  GI upset    Septra [sulfamethoxazole-trimethoprim] Nausea And Vomiting   Shellfish Allergy Nausea And Vomiting      Medication List        Accurate as of October 12, 2020 11:59 PM. If you have any questions, ask your nurse or doctor.        STOP taking these medications   ciprofloxacin 500 MG tablet Commonly known as: CIPRO Stopped by: Srihith Aquilino X Keveon Amsler, NP   ezetimibe 10 MG tablet Commonly known as: ZETIA Stopped by: Fleta Borgeson X Lucca Ballo, NP   potassium chloride 10 MEQ tablet Commonly known as: KLOR-CON Stopped by: Mitchelle Sultan X Lorain Keast, NP     TAKE these medications   acetaminophen 500 MG tablet Commonly known as: TYLENOL Take 500 mg by mouth daily as needed for mild pain. not to exceed 3 gm in 24 hours. As needed for arthritis pain   acetaminophen 500 MG tablet Commonly known as: TYLENOL Take 500 mg by mouth 2 (two) times daily.   AeroChamber MV inhaler by Other route. Use as instructed as needed   albuterol 108 (90 Base) MCG/ACT inhaler Commonly known as: VENTOLIN HFA Inhale 2 puffs into the lungs every 6 (six) hours as needed for wheezing or shortness of breath.   aspirin EC 81 MG tablet Take 81 mg by mouth daily.   Calcium 600/Vitamin D3 600-800 MG-UNIT Tabs Generic drug: Calcium Carb-Cholecalciferol Take 1 tablet by mouth daily.   carbidopa-levodopa 25-100 MG tablet Commonly known as: SINEMET IR Take 2 tablets by mouth in the morning and at bedtime.   cephALEXin 250 MG capsule Commonly known as: KEFLEX Take 250 mg by mouth daily.   docusate sodium 100 MG capsule Commonly known as: COLACE Take 100 mg by mouth at bedtime.   eucerin cream Apply 1 application topically daily.   famotidine 20 MG tablet Commonly known as: PEPCID Take 20 mg by mouth daily.   furosemide 20 MG tablet Commonly known as: LASIX Take 20 mg by mouth daily.   hydroxychloroquine 200 MG tablet Commonly known as: PLAQUENIL Take 200 mg by mouth daily.   levothyroxine 50 MCG tablet Commonly known as: SYNTHROID Take 50 mcg by mouth daily before breakfast.   lubiprostone 24 MCG capsule Commonly known as: AMITIZA Take 24 mcg by mouth 2  (two) times daily with a meal.   multivitamin with minerals tablet Take 1 tablet by mouth daily.   PRESERVISION AREDS 2 PO Take 1 tablet by mouth daily.   nystatin powder Commonly known as: MYCOSTATIN/NYSTOP Apply topically 2 (two) times daily as needed.   pantoprazole 40 MG tablet Commonly known as: PROTONIX Take 40 mg by mouth daily.   potassium citrate 10 MEQ (1080 MG) SR tablet Commonly known  as: UROCIT-K Take 10 mEq by mouth 2 (two) times daily.   pramipexole 1 MG tablet Commonly known as: MIRAPEX Take 1 mg by mouth 2 (two) times daily.   QUEtiapine 25 MG tablet Commonly known as: SEROQUEL Take 12.5 mg by mouth every morning. 1/2 tablet to = 12.5 mg QAM   QUEtiapine 25 MG tablet Commonly known as: SEROQUEL Take 25 mg by mouth daily.   rosuvastatin 20 MG tablet Commonly known as: CRESTOR Take 20 mg by mouth daily.   saccharomyces boulardii 250 MG capsule Commonly known as: FLORASTOR Take 250 mg by mouth daily.   senna-docusate 8.6-50 MG tablet Commonly known as: Senokot-S Take 1 tablet by mouth at bedtime.   sertraline 50 MG tablet Commonly known as: ZOLOFT Take 75 mg by mouth at bedtime. 1-1/2 tablets to = 75 mg   Slow Fe 142 (45 Fe) MG Tbcr Generic drug: Ferrous Sulfate Take 1 tablet by mouth. Daily   Systane 0.4-0.3 % Soln Generic drug: Polyethyl Glycol-Propyl Glycol Apply 1 drop to eye 2 (two) times daily as needed (dry eyes).   Vitamin D 50 MCG (2000 UT) tablet Take 2,000 Units by mouth daily.       Review of Systems  Constitutional: Negative for activity change, fever and unexpected weight change.  HENT: Positive for hearing loss. Negative for congestion and voice change.   Eyes: Negative for visual disturbance.  Respiratory: Positive for shortness of breath. Negative for cough.        Chronic DOE  Cardiovascular: Positive for leg swelling.  Gastrointestinal: Negative for abdominal pain and constipation.  Genitourinary: Negative for  dysuria, hematuria and urgency.  Musculoskeletal: Positive for arthralgias, back pain, gait problem and myalgias.       Chronic lower back pain. The R 3rd finger mid knuckle pain is chronic  Skin: Negative for color change.  Neurological: Positive for tremors. Negative for speech difficulty, weakness and headaches.       Memory lapses. Minimal resting tremor in fingers, not disabling.   Psychiatric/Behavioral: Positive for hallucinations. Negative for behavioral problems and sleep disturbance. The patient is not nervous/anxious.        On and off visual hallucination    Immunization History  Administered Date(s) Administered  . Influenza Split 09/09/2013  . Influenza, High Dose Seasonal PF 09/12/2018, 09/15/2019  . Influenza,inj,Quad PF,6+ Mos 09/11/2016  . Influenza-Unspecified 09/21/2020  . Moderna SARS-COVID-2 Vaccination 12/12/2019, 01/09/2020  . Pneumococcal Conjugate-13 04/15/2014  . Pneumococcal Polysaccharide-23 09/28/2005  . Tdap 09/01/2015  . Zoster Recombinat (Shingrix) 06/11/2018, 09/10/2018   Pertinent  Health Maintenance Due  Topic Date Due  . INFLUENZA VACCINE  Completed  . DEXA SCAN  Completed  . PNA vac Low Risk Adult  Completed   Fall Risk  11/16/2019 04/23/2019 04/09/2019 10/29/2018 10/27/2018  Falls in the past year? 0 0 0 0 1  Comment - - - fell June 2019 -  Number falls in past yr: - 0 0 - 0  Injury with Fall? - 0 0 - 1  Comment - - - - -  Risk for fall due to : - - - - -  Risk for fall due to: Comment - - - - -   Functional Status Survey:    Vitals:   10/12/20 1400  BP: 100/70  Pulse: 80  Resp: 20  Temp: 98.6 F (37 C)  SpO2: 95%  Weight: 136 lb 4.8 oz (61.8 kg)  Height: 5\' 2"  (1.575 m)   Body mass index is  24.93 kg/m. Physical Exam Vitals and nursing note reviewed.  Constitutional:      Appearance: Normal appearance.  HENT:     Head: Normocephalic and atraumatic.     Mouth/Throat:     Mouth: Mucous membranes are moist.  Eyes:      Extraocular Movements: Extraocular movements intact.     Conjunctiva/sclera: Conjunctivae normal.     Pupils: Pupils are equal, round, and reactive to light.  Cardiovascular:     Rate and Rhythm: Normal rate and regular rhythm.     Heart sounds: Murmur heard.   Pulmonary:     Breath sounds: No rhonchi or rales.  Abdominal:     General: Bowel sounds are normal.     Palpations: Abdomen is soft.     Tenderness: There is no abdominal tenderness.  Musculoskeletal:        General: Tenderness present.     Cervical back: Normal range of motion and neck supple.     Right lower leg: Edema present.     Left lower leg: Edema present.     Comments: Trace edema BLE.  Arthritic changes in fingers, R>L. The R 3rd finger mid knuckle swelling, no heat or redness, no deformity.   Skin:    General: Skin is warm and dry.     Comments: A marble sized hematoma occiput, abrasion mid thoracic spine.   Neurological:     General: No focal deficit present.     Mental Status: She is alert. Mental status is at baseline.     Gait: Gait abnormal.     Comments: Oriented to person, place.   Psychiatric:        Mood and Affect: Mood normal.        Behavior: Behavior normal.     Labs reviewed: Recent Labs     0000 04/21/20 0325 04/22/20 0605 04/22/20 0903 04/23/20 0350 04/23/20 0350 04/24/20 0423 04/24/20 0423 04/25/20 0427 04/25/20 0427 05/24/20 1015 05/24/20 1015 08/04/20 0000 09/08/20 0000 10/05/20 0000  NA   < > 143  --    < > 144   < > 145   < > 146*   < > 141  --  141 141 142  K  --  3.4*  --    < > 3.1*   < > 3.0*   < > 3.8   < > 3.5   < > 3.9 4.1 4.2  CL  --  115*  --    < > 119*   < > 115*   < > 115*   < > 107   < > 106 105 107  CO2  --  20*  --    < > 21*   < > 22   < > 26   < > 24   < > 25* 26* 25*  GLUCOSE  --  129*  --    < > 80   < > 85  --  88  --  100*  --   --   --   --   BUN   < > 35*  --    < > 33*   < > 26*   < > 16   < > 12  --  13 15 17   CREATININE   < > 1.23*  --    < >  1.01*   < > 0.78   < > 0.81   < > 0.81  --  1.0 0.9 0.9  CALCIUM  --  7.2*  --    < > 7.2*   < > 7.0*   < > 7.6*   < > 8.9   < > 9.5 10.0 9.6  MG  --  3.0* 2.6*  --  2.5*  --   --   --   --   --   --   --   --   --   --   PHOS  --  3.2 2.6  --  2.3*  --   --   --   --   --   --   --   --   --   --    < > = values in this interval not displayed.   Recent Labs    04/20/20 0455 04/20/20 0455 04/21/20 0325 04/24/20 0423 10/05/20 0000  AST 117*   < > 50* 32 19  ALT 18   < > 8 7 15   ALKPHOS 95   < > 95 77 62  BILITOT 0.5  --  0.5 0.6  --   PROT 5.1*  --  5.4* 4.7*  --   ALBUMIN 2.3*   < > 2.3* 2.3* 3.5   < > = values in this interval not displayed.   Recent Labs    04/24/20 0423 04/24/20 0423 04/25/20 0427 04/26/20 0000 05/24/20 1015 06/21/20 0000 06/30/20 0000 08/04/20 0000 10/05/20 0000  WBC 14.5*   < > 13.8*   < > 7.8   < > 9.1 6.2 4.8  NEUTROABS  --   --   --    < >  --    < > 6,406 3,677 2,328.00  HGB 8.8*   < > 9.4*   < > 9.8*   < > 8.7* 8.9* 9.6*  HCT 27.1*   < > 29.7*   < > 30.9*   < > 27* 28* 30*  MCV 97.5  --  96.7  --  98.4  --   --   --   --   PLT 194   < > 226   < > 286   < > 443* 354 288   < > = values in this interval not displayed.   Lab Results  Component Value Date   TSH 1.04 10/05/2020   No results found for: HGBA1C Lab Results  Component Value Date   CHOL 105 10/05/2020   HDL 46 10/05/2020   LDLCALC 38 10/05/2020   TRIG 119 10/05/2020   CHOLHDL 2.4 07/14/2019    Significant Diagnostic Results in last 30 days:  No results found.  Assessment/Plan  Aortic stenosis AS 09/06/20 Cardiology palliative approach  Iron deficiency anemia Anemia, takes Fe  Chronic diastolic CHF (congestive heart failure) (HCC) CHF/trace edema BLE, takes Furosemide 20mg  qd, cardiology, severe aortic valve stenosis.   Erosive (osteo)arthritis Erosive arthritis, takes Plaquenil 200mg  qd, Tylenol 500mg  bid, f/u Rheumatology.    Hypothyroidism Hypothyroidism, takes Levothyroxine 73mcg qd.   Chronic constipation Constipation, takes Senokot S I qd, Amitiza 67mcg bid, Colace qd   Depression, psychotic (HCC) Depression/hallucination, takes Sertraline 75mg  qd, Quetiapine 25mg  qd, 12.5mg  qd   Parkinson's disease Parkinson's, takes MiraPex 1mg  bid, Sinemet 25/100 II bid.    GERD (gastroesophageal reflux disease) GERD, takes Pantoprazole 40mg  qd, Famotidine 20mg  qd.   Recurrent UTI Hx of rena lithiasis, recurrent UTI, urosepsis, takes Keflex for suppression therapy, f/u Urology    Family/ staff Communication: plan of care reviewed with the patient and charge nurse.  Labs/tests ordered:  none  Time spend 35 minutes.

## 2020-10-12 NOTE — Assessment & Plan Note (Signed)
CHF/trace edema BLE, takes Furosemide 20mg  qd, cardiology, severe aortic valve stenosis.

## 2020-10-12 NOTE — Assessment & Plan Note (Signed)
Parkinson's, takes MiraPex 1mg  bid, Sinemet 25/100 II bid.

## 2020-10-12 NOTE — Assessment & Plan Note (Signed)
Erosive arthritis, takes Plaquenil 200mg qd, Tylenol 500mg bid, f/u Rheumatology.  

## 2020-10-12 NOTE — Assessment & Plan Note (Signed)
AS 09/06/20 Cardiology palliative approach

## 2020-10-12 NOTE — Assessment & Plan Note (Addendum)
GERD, takes Pantoprazole 40mg  qd, Famotidine 20mg  qd.

## 2020-10-12 NOTE — Assessment & Plan Note (Signed)
Depression/hallucination, takes Sertraline 75mg  qd, Quetiapine 25mg  qd, 12.5mg  qd

## 2020-10-12 NOTE — Assessment & Plan Note (Signed)
Anemia, takes Fe

## 2020-10-12 NOTE — Assessment & Plan Note (Signed)
Hypothyroidism, takes Levothyroxine 71mcg qd.

## 2020-10-12 NOTE — Assessment & Plan Note (Addendum)
Constipation, takes Senokot S I qd, Amitiza 24mcgbid, Colace qd  

## 2020-10-12 NOTE — Assessment & Plan Note (Signed)
Hx of rena lithiasis, recurrent UTI, urosepsis, takes Keflex for suppression therapy, f/u Urology 

## 2020-10-13 ENCOUNTER — Encounter: Payer: Self-pay | Admitting: Nurse Practitioner

## 2020-10-13 DIAGNOSIS — F060 Psychotic disorder with hallucinations due to known physiological condition: Secondary | ICD-10-CM | POA: Diagnosis not present

## 2020-10-18 DIAGNOSIS — Z23 Encounter for immunization: Secondary | ICD-10-CM | POA: Diagnosis not present

## 2020-11-15 ENCOUNTER — Other Ambulatory Visit: Payer: Self-pay

## 2020-11-15 ENCOUNTER — Ambulatory Visit (HOSPITAL_COMMUNITY)
Admission: RE | Admit: 2020-11-15 | Discharge: 2020-11-15 | Disposition: A | Discharge status: Home / Self Care | Payer: Medicare Other | Source: Ambulatory Visit | Attending: Cardiology | Admitting: Cardiology

## 2020-11-15 DIAGNOSIS — I509 Heart failure, unspecified: Secondary | ICD-10-CM | POA: Insufficient documentation

## 2020-11-15 DIAGNOSIS — E785 Hyperlipidemia, unspecified: Secondary | ICD-10-CM | POA: Insufficient documentation

## 2020-11-15 DIAGNOSIS — I110 Hypertensive heart disease with heart failure: Secondary | ICD-10-CM | POA: Insufficient documentation

## 2020-11-15 DIAGNOSIS — I350 Nonrheumatic aortic (valve) stenosis: Secondary | ICD-10-CM | POA: Diagnosis not present

## 2020-11-15 DIAGNOSIS — G20 Parkinson's disease: Secondary | ICD-10-CM | POA: Diagnosis not present

## 2020-11-15 NOTE — Progress Notes (Signed)
Echocardiogram 2D Echocardiogram has been performed.  Oneal Deputy Luvia Orzechowski 11/15/2020, 2:13 PM

## 2020-11-16 ENCOUNTER — Encounter: Payer: Self-pay | Admitting: Nurse Practitioner

## 2020-11-16 ENCOUNTER — Non-Acute Institutional Stay (SKILLED_NURSING_FACILITY): Payer: Medicare Other | Admitting: Nurse Practitioner

## 2020-11-16 DIAGNOSIS — I5032 Chronic diastolic (congestive) heart failure: Secondary | ICD-10-CM

## 2020-11-16 DIAGNOSIS — E02 Subclinical iodine-deficiency hypothyroidism: Secondary | ICD-10-CM | POA: Diagnosis not present

## 2020-11-16 DIAGNOSIS — K5909 Other constipation: Secondary | ICD-10-CM | POA: Diagnosis not present

## 2020-11-16 DIAGNOSIS — F323 Major depressive disorder, single episode, severe with psychotic features: Secondary | ICD-10-CM

## 2020-11-16 DIAGNOSIS — D500 Iron deficiency anemia secondary to blood loss (chronic): Secondary | ICD-10-CM

## 2020-11-16 DIAGNOSIS — M154 Erosive (osteo)arthritis: Secondary | ICD-10-CM

## 2020-11-16 DIAGNOSIS — K219 Gastro-esophageal reflux disease without esophagitis: Secondary | ICD-10-CM | POA: Diagnosis not present

## 2020-11-16 DIAGNOSIS — I350 Nonrheumatic aortic (valve) stenosis: Secondary | ICD-10-CM | POA: Diagnosis not present

## 2020-11-16 DIAGNOSIS — G20 Parkinson's disease: Secondary | ICD-10-CM | POA: Diagnosis not present

## 2020-11-16 DIAGNOSIS — N200 Calculus of kidney: Secondary | ICD-10-CM

## 2020-11-16 LAB — ECHOCARDIOGRAM COMPLETE
AR max vel: 0.87 cm2
AV Area VTI: 0.79 cm2
AV Area mean vel: 0.83 cm2
AV Mean grad: 42 mmHg
AV Peak grad: 68.6 mmHg
Ao pk vel: 4.14 m/s
Area-P 1/2: 2.46 cm2
S' Lateral: 2.7 cm

## 2020-11-16 NOTE — Assessment & Plan Note (Signed)
Erosive arthritis, takes Plaquenil 200mg qd, Tylenol 500mg bid, f/u Rheumatology.  

## 2020-11-16 NOTE — Progress Notes (Signed)
Location:   SNF [place] Room Number: 45-A Place of Service:  SNF (31) Provider: [facility] Ijeoma Loor NP  [name], MD  Patient Care Team: [name], MD as PCP - General (Internal Medicine) [name], MD as PCP - Cardiology (Cardiology) [name], MD (Neurosurgery) [name], MD (Neurology) [name], MD (Inactive) as Consulting Physician (Gastroenterology) [name], MD as Consulting Physician (Urology) Valaria Kohut X, NP as Nurse Practitioner (Internal Medicine)  Extended Emergency Contact Information Primary Emergency Contact: [facility] Address: [address]          [place] [address] [name] of [place] Phone: [phone] Mobile Phone: [phone] Relation: Spouse Secondary Emergency Contact: [name] Mobile Phone: [phone] Relation: Daughter Preferred language: [name] Interpreter needed? No  Code Status:  DNR Goals of care: Advanced Directive information Advanced Directives 11/16/2020  Does Patient Have a Medical Advance Directive? Yes  Type of Advance Directive Out of facility DNR (pink MOST or yellow form);Living will  Does patient want to make changes to medical advance directive? No - Patient declined  Copy of [place] in Chart? -  Would patient like information on creating a medical advance directive? -  Pre-existing out of facility DNR order (yellow form or pink MOST form) -     Chief Complaint  Patient presents with  . Medical Management of Chronic Issues    Routine [facility] visit    HPI:  Pt is a 86 y.o. female seen today for medical management of chronic diseases.   Hx of rena lithiasis, recurrent UTI, urosepsis, takes Keflex for suppression therapy, f/u Urology GERD, takes Pantoprazole 40mg  qd, Famotidine 20mg  qd. Parkinson's, takes MiraPex 1mg  bid, Sinemet 25/100 II bid.   Depression/hallucination, takes Sertraline 75mg  qd, Quetiapine 25mg  qd, 12.5mg  qd Constipation, takes Senokot S I qd, Amitiza 39mcg bid, Colace qd Hypothyroidism, takes Levothyroxine 63mcg qd. TSH 1.04 10/04/20 Erosive arthritis, takes Plaquenil 200mg  qd, Tylenol 500mg  bid, f/u Rheumatology.  CHF/trace edema BLE, takes Furosemide 20mg  qd, cardiology, severe aortic valve stenosis.Bun/creat 17/0.91 10/04/20. Echocardiogram 11/15/20 EF 60-65% Anemia, takes Fe, Hgb 9.6 10/04/20             AS Cardiology palliative approach    Past Medical History:  Diagnosis Date  . Abnormal nuclear stress test   . Actinic cheilitis 04/23/2019  . Anemia    iron deficiency   . Anxiety   . Aortic stenosis    severe by echo 2021   . Arthritis   . Broken arm    right   . Chronic diastolic CHF (congestive heart failure) ([place])   . Chronic pain   . Cognitive communication deficit   . Constipation   . Depression   . Erosive (osteo)arthritis 03/09/2020  . Family history of adverse reaction to anesthesia    pt. states sister vomits  . Fibromyalgia   . GERD (gastroesophageal reflux disease)   . H/O hiatal hernia   . Hallucinations, visual 07/21/2018   03/10/20 psych consult.   . Hematuria 05/18/2020  . History of bronchitis   . History of kidney stones   . Hypercholesterolemia   . Hypertension    dr t turner  . Hypothyroidism   . Memory loss 04/21/2018   08/08/18 CT head no traumatic findings, atrophy.  09/27/19 MMSE 25/30, failed the clock drawing  . Microhematuria 03/24/2015  . Neuropathy   . Osteoporosis   . Parkinson disease ([place])   . PVD (  peripheral vascular disease) (HCC)    99% stenosis of left anteiror tibial artery, mod stenosis of left distal SFA and popliteal artery followed by [name]  . RBBB    noted on EKG 2018  . Restless leg syndrome   . Sepsis ([place])    hx of due to [facility]  . Septic shock ([place]) 04/19/2020  .  Shortness of breath    with exertion - chronic   . Sjogren's disease ([place])   . Sjogren's syndrome [facility]) 02/04/2018   04/16/19 rheumatology: Erosive OA of hands, Sjogrens syndrome, low back pain at multiple sites, recurrent kidney stones, age related osteoporosis w/o current pathological fracture, f/u 6 months.   . SOB (shortness of breath)    chronic due to diastolic dysfunction, deconditioning, obesity  . Spinal stenosis    lumbar region   . Tremor   . Unstable gait 04/21/2018  . Unsteadiness on feet   . Urinary frequency 09/18/2018  . Urinary tract infection   . Visual hallucinations    Past Surgical History:  Procedure Laterality Date  . ABDOMINAL HYSTERECTOMY    . BACK SURGERY     4 back surgeries,   lumbar fusion  . CARDIAC CATHETERIZATION  2006   normal  . CYSTOSCOPY/URETEROSCOPY/HOLMIUM LASER/STENT PLACEMENT Left 01/06/2019   Procedure: CYSTOSCOPY/RETROGRADE/URETEROSCOPY/HOLMIUM LASER/BASKET RETRIEVAL/STENT PLACEMENT;  Surgeon: Ceasar Mons, MD;  Location: [facility];  Service: Urology;  Laterality: Left;  . CYSTOSCOPY/URETEROSCOPY/HOLMIUM LASER/STENT PLACEMENT Bilateral 05/24/2020   Procedure: CYSTOSCOPY/RETROGRADE/URETEROSCOPY/HOLMIUM LASER/STENT PLACEMENT;  Surgeon: Ceasar Mons, MD;  Location: [facility];  Service: Urology;  Laterality: Bilateral;  . EYE SURGERY Bilateral    cateracts  . falls     variious fall, broken wrist,and toes  . FRACTURE SURGERY Right April 2016   Wrist, Pt. fell  . IR NEPHROSTOMY PLACEMENT RIGHT  04/19/2020  . kidney stone removal Left 01/20/2019  . RIGHT/LEFT HEART CATH AND CORONARY ANGIOGRAPHY N/A 02/05/2018   Procedure: RIGHT/LEFT HEART CATH AND CORONARY ANGIOGRAPHY;  Surgeon: Troy Sine, MD;  Location: [facility];  Service: Cardiovascular;  Laterality: N/A;  . SPINAL CORD STIMULATOR INSERTION N/A 09/09/2015   Procedure: LUMBAR SPINAL CORD STIMULATOR INSERTION;  Surgeon: Clydell Hakim, MD;  Location: [facility];   Service: Neurosurgery;  Laterality: N/A;  LUMBAR SPINAL CORD STIMULATOR INSERTION  . SPINE SURGERY  April 2013   Back X's 4  . TOTAL HIP ARTHROPLASTY     right  . WRIST FRACTURE SURGERY Bilateral     Allergies  Allergen Reactions  . Adrenalone   . Lactose Other (See Comments)    abd pain, lactose intolerant  . Morphine Other (See Comments)  . Sulfa Antibiotics Other (See Comments)    Headache, very sick  . Codeine Other (See Comments)    Reaction:  Headaches and nightmares   . Lactose Intolerance (Gi) Nausea And Vomiting  . Latex Rash  . Lyrica [Pregabalin] Swelling and Other (See Comments)    Reaction:  Leg swelling  . Other Other (See Comments)    Pt states that pain medications give her nightmares.    . Plaquenil [Hydroxychloroquine Sulfate] Other (See Comments)    Reaction:  GI upset   . Reglan [Metoclopramide] Other (See Comments)    Reaction:  GI upset   . Requip [Ropinirole Hcl] Other (See Comments)    Reaction:  GI upset   . Septra [Sulfamethoxazole-Trimethoprim] Nausea And Vomiting  . Shellfish Allergy Nausea And Vomiting    Allergies as of 11/16/2020  Reactions   Adrenalone    Lactose Other (See Comments)   abd pain, lactose intolerant   Morphine Other (See Comments)   Sulfa Antibiotics Other (See Comments)   Headache, very sick   Codeine Other (See Comments)   Reaction:  Headaches and nightmares    Lactose Intolerance (gi) Nausea And Vomiting   Latex Rash   Lyrica [pregabalin] Swelling, Other (See Comments)   Reaction:  Leg swelling   Other Other (See Comments)   Pt states that pain medications give her nightmares.     Plaquenil [hydroxychloroquine Sulfate] Other (See Comments)   Reaction:  GI upset    Reglan [metoclopramide] Other (See Comments)   Reaction:  GI upset    Requip [ropinirole Hcl] Other (See Comments)   Reaction:  GI upset    Septra [sulfamethoxazole-trimethoprim] Nausea And Vomiting   Shellfish Allergy Nausea And Vomiting       Medication List       Accurate as of November 16, 2020 11:59 PM. If you have any questions, ask your nurse or doctor.        acetaminophen 500 MG tablet Commonly known as: TYLENOL Take 500 mg by mouth daily as needed for mild pain. not to exceed 3 gm in 24 hours. As needed for arthritis pain   acetaminophen 500 MG tablet Commonly known as: TYLENOL Take 500 mg by mouth 2 (two) times daily.   AeroChamber MV inhaler by Other route. Use as instructed as needed   albuterol 108 (90 Base) MCG/ACT inhaler Commonly known as: VENTOLIN HFA Inhale 2 puffs into the lungs every 6 (six) hours as needed for wheezing or shortness of breath.   aspirin EC 81 MG tablet Take 81 mg by mouth daily.   Calcium 600/Vitamin D3 600-800 MG-UNIT Tabs Generic drug: Calcium Carb-Cholecalciferol Take 1 tablet by mouth daily.   carbidopa-levodopa 25-100 MG tablet Commonly known as: SINEMET IR Take 2 tablets by mouth in the morning and at bedtime.   cephALEXin 250 MG capsule Commonly known as: KEFLEX Take 250 mg by mouth daily.   docusate sodium 100 MG capsule Commonly known as: COLACE Take 100 mg by mouth at bedtime.   eucerin cream Apply 1 application topically daily.   famotidine 20 MG tablet Commonly known as: PEPCID Take 20 mg by mouth daily.   furosemide 20 MG tablet Commonly known as: LASIX Take 20 mg by mouth daily.   hydroxychloroquine 200 MG tablet Commonly known as: PLAQUENIL Take 200 mg by mouth daily.   levothyroxine 50 MCG tablet Commonly known as: SYNTHROID Take 50 mcg by mouth daily before breakfast.   lubiprostone 24 MCG capsule Commonly known as: AMITIZA Take 24 mcg by mouth 2 (two) times daily with a meal.   multivitamin with minerals tablet Take 1 tablet by mouth daily.   PRESERVISION AREDS 2 PO Take 1 tablet by mouth daily.   nystatin powder Commonly known as: MYCOSTATIN/NYSTOP Apply topically 2 (two) times daily as needed.   pantoprazole 40 MG  tablet Commonly known as: PROTONIX Take 40 mg by mouth daily.   potassium citrate 10 MEQ (1080 MG) SR tablet Commonly known as: UROCIT-K Take 10 mEq by mouth 2 (two) times daily.   pramipexole 1 MG tablet Commonly known as: MIRAPEX Take 1 mg by mouth 2 (two) times daily.   QUEtiapine 25 MG tablet Commonly known as: SEROQUEL Take 12.5 mg by mouth every morning. 1/2 tablet to = 12.5 mg QAM   QUEtiapine 25 MG tablet Commonly known  as: SEROQUEL Take 25 mg by mouth daily.   rosuvastatin 20 MG tablet Commonly known as: CRESTOR Take 20 mg by mouth daily.   saccharomyces boulardii 250 MG capsule Commonly known as: FLORASTOR Take 250 mg by mouth daily.   senna-docusate 8.6-50 MG tablet Commonly known as: Senokot-S Take 1 tablet by mouth at bedtime.   sertraline 50 MG tablet Commonly known as: ZOLOFT Take 75 mg by mouth at bedtime. 1-1/2 tablets to = 75 mg   Slow Fe 142 (45 Fe) MG Tbcr Generic drug: Ferrous Sulfate Take 1 tablet by mouth. Daily   Systane 0.4-0.3 % Soln Generic drug: Polyethyl Glycol-Propyl Glycol Apply 1 drop to eye 2 (two) times daily as needed (dry eyes).   Vitamin D 50 MCG (2000 UT) tablet Take 2,000 Units by mouth daily.       Review of Systems  Constitutional: Negative for activity change, fever and unexpected weight change.  HENT: Positive for hearing loss. Negative for congestion and voice change.   Eyes: Negative for visual disturbance.  Respiratory: Positive for shortness of breath. Negative for cough.        Chronic DOE  Cardiovascular: Positive for leg swelling.  Gastrointestinal: Negative for abdominal pain and constipation.  Genitourinary: Negative for dysuria, hematuria and urgency.  Musculoskeletal: Positive for arthralgias, back pain, gait problem and myalgias.       Chronic lower back pain. The R 3rd finger mid knuckle pain is chronic  Skin: Negative for color change.  Neurological: Positive for tremors. Negative for speech  difficulty, weakness and headaches.       Memory lapses. Minimal resting tremor in fingers, not disabling.   Psychiatric/Behavioral: Positive for hallucinations. Negative for behavioral problems and sleep disturbance. The patient is not nervous/anxious.        On and off visual hallucination    Immunization History  Administered Date(s) Administered  . Influenza Split 09/09/2013  . Influenza, High Dose Seasonal PF 09/12/2018, 09/15/2019  . Influenza,inj,Quad PF,6+ Mos 09/11/2016  . Influenza-Unspecified 09/21/2020  . Moderna SARS-COVID-2 Vaccination 12/12/2019, 01/09/2020  . Pneumococcal Conjugate-13 04/15/2014  . Pneumococcal Polysaccharide-23 09/28/2005  . Tdap 09/01/2015  . Zoster Recombinat (Shingrix) 06/11/2018, 09/10/2018   Pertinent  Health Maintenance Due  Topic Date Due  . INFLUENZA VACCINE  Completed  . DEXA SCAN  Completed  . PNA vac Low Risk Adult  Completed   Fall Risk  11/16/2019 04/23/2019 04/09/2019 10/29/2018 10/27/2018  Falls in the past year? 0 0 0 0 1  Comment - - - fell June 2019 -  Number falls in past yr: - 0 0 - 0  Injury with Fall? - 0 0 - 1  Comment - - - - -  Risk for fall due to : - - - - -  Risk for fall due to: Comment - - - - -   Functional Status Survey:    Vitals:   11/16/20 1526  BP: 118/68  Pulse: 82  Resp: 20  Temp: 97.7 F (36.5 C)  TempSrc: Oral  SpO2: 95%  Weight: 137 lb 1.6 oz (62.2 kg)  Height: 5\' 2"  (1.575 m)   Body mass index is 25.08 kg/m. Physical Exam Vitals and nursing note reviewed.  Constitutional:      Appearance: Normal appearance.  HENT:     Head: Normocephalic and atraumatic.     Mouth/Throat:     Mouth: Mucous membranes are moist.  Eyes:     Extraocular Movements: Extraocular movements intact.     Conjunctiva/sclera:  Conjunctivae normal.     Pupils: Pupils are equal, round, and reactive to light.  Cardiovascular:     Rate and Rhythm: Normal rate and regular rhythm.     Heart sounds: Murmur heard.    Pulmonary:     Breath sounds: No rhonchi or rales.  Abdominal:     General: Bowel sounds are normal.     Palpations: Abdomen is soft.     Tenderness: There is no abdominal tenderness.  Musculoskeletal:        General: Tenderness present.     Cervical back: Normal range of motion and neck supple.     Right lower leg: Edema present.     Left lower leg: Edema present.     Comments: Trace edema BLE.  Arthritic changes in fingers, R>L. The R 3rd finger mid knuckle swelling, no heat or redness, no deformity.   Skin:    General: Skin is warm and dry.     Comments: A marble sized hematoma occiput, abrasion mid thoracic spine.   Neurological:     General: No focal deficit present.     Mental Status: She is alert. Mental status is at baseline.     Gait: Gait abnormal.     Comments: Oriented to person, place.   Psychiatric:        Mood and Affect: Mood normal.        Behavior: Behavior normal.     Labs reviewed: Recent Labs    04/21/20 0325 04/22/20 0605 04/22/20 0903 04/23/20 0350 04/24/20 0423 04/25/20 0427 05/24/20 1015 08/04/20 0000 09/08/20 0000 10/05/20 0000  NA 143  --    < > 144 145 146* 141 141 141 142  K 3.4*  --    < > 3.1* 3.0* 3.8 3.5 3.9 4.1 4.2  CL 115*  --    < > 119* 115* 115* 107 106 105 107  CO2 20*  --    < > 21* 22 26 24  25* 26* 25*  GLUCOSE 129*  --    < > 80 85 88 100*  --   --   --   BUN 35*  --    < > 33* 26* 16 12 13 15 17   CREATININE 1.23*  --    < > 1.01* 0.78 0.81 0.81 1.0 0.9 0.9  CALCIUM 7.2*  --    < > 7.2* 7.0* 7.6* 8.9 9.5 10.0 9.6  MG 3.0* 2.6*  --  2.5*  --   --   --   --   --   --   PHOS 3.2 2.6  --  2.3*  --   --   --   --   --   --    < > = values in this interval not displayed.   Recent Labs    04/20/20 0455 04/21/20 0325 04/24/20 0423 10/05/20 0000  AST 117* 50* 32 19  ALT 18 8 7 15   ALKPHOS 95 95 77 62  BILITOT 0.5 0.5 0.6  --   PROT 5.1* 5.4* 4.7*  --   ALBUMIN 2.3* 2.3* 2.3* 3.5   Recent Labs    04/24/20 0423  04/25/20 0427 04/26/20 0000 05/24/20 1015 06/21/20 0000 06/30/20 0000 08/04/20 0000 10/05/20 0000  WBC 14.5* 13.8*   < > 7.8   < > 9.1 6.2 4.8  NEUTROABS  --   --    < >  --    < > 6,406 3,677 2,328.00  HGB 8.8* 9.4*   < >  9.8*   < > 8.7* 8.9* 9.6*  HCT 27.1* 29.7*   < > 30.9*   < > 27* 28* 30*  MCV 97.5 96.7  --  98.4  --   --   --   --   PLT 194 226   < > 286   < > 443* 354 288   < > = values in this interval not displayed.   Lab Results  Component Value Date   TSH 1.04 10/05/2020   No results found for: HGBA1C Lab Results  Component Value Date   CHOL 105 10/05/2020   HDL 46 10/05/2020   LDLCALC 38 10/05/2020   TRIG 119 10/05/2020   CHOLHDL 2.4 07/14/2019    Significant Diagnostic Results in last 30 days:  ECHOCARDIOGRAM COMPLETE  Result Date: 11/16/2020    ECHOCARDIOGRAM REPORT   Patient Name:   [name] Date of Exam: 11/15/2020 Medical Rec #:  [id number]         Height:       62.0 in Accession #:    [id number]        Weight:       136.3 lb Date of Birth:  [birth year]        BSA:          1.624 m Patient Age:    43 years          BP:           126/70 mmHg Patient Gender: F                 HR:           76 bpm. Exam Location:  Outpatient Procedure: 2D Echo, Color Doppler and Cardiac Doppler Indications:    I35.0 Nonrheumatic aortic (valve) stenosis  History:        Patient has prior history of Echocardiogram examinations, most                 recent 04/20/2020. CHF; Risk Factors:Hypertension, Dyslipidemia                 and Parkinson's.  Sonographer:    Raquel Sarna Senior RDCS Referring Phys: [place] Comments: Only repositioned onto left side for end of study due to limited mobility. IMPRESSIONS  1. Left ventricular ejection fraction, by estimation, is 60 to 65%. The left ventricle has normal function. The left ventricle has no regional wall motion abnormalities. Left ventricular diastolic parameters are consistent with Grade I diastolic dysfunction  (impaired relaxation).  2. Right ventricular systolic function is normal. The right ventricular size is normal.  3. Left atrial size was mildly dilated.  4. The mitral valve is normal in structure. No evidence of mitral valve regurgitation. No evidence of mitral stenosis.  5. The aortic valve is normal in structure. There is severe calcifcation of the aortic valve. There is severe thickening of the aortic valve. Aortic valve regurgitation is not visualized. Severe aortic valve stenosis. Aortic valve mean gradient measures  42.0 mmHg.  6. The inferior vena cava is normal in size with greater than 50% respiratory variability, suggesting right atrial pressure of 3 mmHg. Comparison(s): No significant change from prior study. FINDINGS  Left Ventricle: Left ventricular ejection fraction, by estimation, is 60 to 65%. The left ventricle has normal function. The left ventricle has no regional wall motion abnormalities. The left ventricular internal cavity size was normal in size. There is  no left ventricular hypertrophy. Left ventricular diastolic  parameters are consistent with Grade I diastolic dysfunction (impaired relaxation). Normal left ventricular filling pressure. Right Ventricle: The right ventricular size is normal. No increase in right ventricular wall thickness. Right ventricular systolic function is normal. Left Atrium: Left atrial size was mildly dilated. Right Atrium: Right atrial size was not assessed. Pericardium: There is no evidence of pericardial effusion. Mitral Valve: The mitral valve is normal in structure. Mild mitral annular calcification. No evidence of mitral valve regurgitation. No evidence of mitral valve stenosis. Tricuspid Valve: The tricuspid valve is normal in structure. Tricuspid valve regurgitation is mild . No evidence of tricuspid stenosis. Aortic Valve: The aortic valve is normal in structure. There is severe calcifcation of the aortic valve. There is severe thickening of the aortic  valve. Aortic valve regurgitation is not visualized. Severe aortic stenosis is present. Aortic valve mean gradient measures 42.0 mmHg. Aortic valve peak gradient measures 68.6 mmHg. Aortic valve area, by VTI measures 0.79 cm. Pulmonic Valve: The pulmonic valve was normal in structure. Pulmonic valve regurgitation is not visualized. No evidence of pulmonic stenosis. Aorta: The aortic root is normal in size and structure. Venous: The inferior vena cava is normal in size with greater than 50% respiratory variability, suggesting right atrial pressure of 3 mmHg. IAS/Shunts: No atrial level shunt detected by color flow Doppler.  LEFT VENTRICLE PLAX 2D LVIDd:         3.80 cm  Diastology LVIDs:         2.70 cm  LV e' medial:    5.77 cm/s LV PW:         0.80 cm  LV E/e' medial:  14.3 LV IVS:        0.80 cm  LV e' lateral:   7.83 cm/s LVOT diam:     2.00 cm  LV E/e' lateral: 10.5 LV SV:         80 LV SV Index:   49 LVOT Area:     3.14 cm  RIGHT VENTRICLE RV S prime:     7.07 cm/s TAPSE (M-mode): 1.1 cm LEFT ATRIUM             Index LA diam:        3.70 cm 2.28 cm/m LA Vol (A2C):   69.0 ml 42.48 ml/m LA Vol (A4C):   54.5 ml 33.55 ml/m LA Biplane Vol: 63.4 ml 39.03 ml/m  AORTIC VALVE AV Area (Vmax):    0.87 cm AV Area (Vmean):   0.83 cm AV Area (VTI):     0.79 cm AV Vmax:           414.00 cm/s AV Vmean:          312.000 cm/s AV VTI:            1.010 m AV Peak Grad:      68.6 mmHg AV Mean Grad:      42.0 mmHg LVOT Vmax:         115.00 cm/s LVOT Vmean:        82.400 cm/s LVOT VTI:          0.254 m LVOT/AV VTI ratio: 0.25  AORTA Ao Root diam: 3.30 cm MITRAL VALVE MV Area (PHT): 2.46 cm     SHUNTS MV Decel Time: 309 msec     Systemic VTI:  0.25 m MV E velocity: 82.30 cm/s   Systemic Diam: 2.00 cm MV A velocity: 115.00 cm/s MV E/A ratio:  0.72 Ena Dawley MD Electronically signed by Ena Dawley MD  Signature Date/Time: 11/16/2020/12:04:13 PM    Final     Assessment/Plan  Renal lithiasis Hx of rena lithiasis,  recurrent UTI, urosepsis, takes Keflex for suppression therapy, f/u Urology   GERD (gastroesophageal reflux disease) GERD, takes Pantoprazole 40mg  qd, Famotidine 20mg  qd.   Parkinson's disease Parkinson's, takes MiraPex 1mg  bid, Sinemet 25/100 II bid.   Depression, psychotic ([place]) Depression/hallucination, takes Sertraline 75mg  qd, Quetiapine 25mg  qd, 12.5mg  qd   Chronic constipation Constipation, takes Senokot S I qd, Amitiza 70mcg bid, Colace qd   Hypothyroidism Hypothyroidism, takes Levothyroxine 61mcg qd. TSH 1.04 10/04/20   Erosive (osteo)arthritis Erosive arthritis, takes Plaquenil 200mg  qd, Tylenol 500mg  bid, f/u Rheumatology.   Chronic diastolic CHF (congestive heart failure) (HCC) CHF/trace edema BLE, takes Furosemide 20mg  qd, cardiology, severe aortic valve stenosis.Bun/creat 17/0.91 10/04/20. Echocardiogram 11/15/20 EF 60-65%   Iron deficiency anemia Anemia, takes Fe, Hgb 9.6 10/04/20  Aortic stenosis AS Cardiology palliative approach    Family/ staff Communication: plan of care reviewed with the patient and charge nurse.   Labs/tests ordered:  none  Time spend 35 minutes.

## 2020-11-16 NOTE — Assessment & Plan Note (Signed)
Depression/hallucination, takes Sertraline 75mg  qd, Quetiapine 25mg  qd, 12.5mg  qd

## 2020-11-16 NOTE — Assessment & Plan Note (Signed)
Anemia, takes Fe, Hgb 9.6 10/04/20

## 2020-11-16 NOTE — Assessment & Plan Note (Signed)
Hx of rena lithiasis, recurrent UTI, urosepsis, takes Keflex for suppression therapy, f/u Urology 

## 2020-11-16 NOTE — Assessment & Plan Note (Signed)
Constipation, takes Senokot S I qd, Amitiza 24mcgbid, Colace qd  

## 2020-11-16 NOTE — Assessment & Plan Note (Signed)
AS Cardiology palliative approach 

## 2020-11-16 NOTE — Assessment & Plan Note (Signed)
Hypothyroidism, takes Levothyroxine 22mcg qd. TSH 1.04 10/04/20

## 2020-11-16 NOTE — Assessment & Plan Note (Signed)
GERD, takes Pantoprazole 40mg  qd, Famotidine 20mg  qd.

## 2020-11-16 NOTE — Assessment & Plan Note (Signed)
Parkinson's, takes MiraPex 1mg  bid, Sinemet 25/100 II bid.

## 2020-11-16 NOTE — Assessment & Plan Note (Signed)
CHF/trace edema BLE, takes Furosemide 20mg  qd, cardiology, severe aortic valve stenosis.Bun/creat 17/0.91 10/04/20. Echocardiogram 11/15/20 EF 60-65%

## 2020-11-17 ENCOUNTER — Encounter: Payer: Self-pay | Admitting: Nurse Practitioner

## 2020-11-22 ENCOUNTER — Ambulatory Visit (INDEPENDENT_AMBULATORY_CARE_PROVIDER_SITE_OTHER): Payer: Medicare Other | Admitting: Cardiology

## 2020-11-22 ENCOUNTER — Other Ambulatory Visit: Payer: Self-pay

## 2020-11-22 ENCOUNTER — Encounter: Payer: Self-pay | Admitting: Cardiology

## 2020-11-22 VITALS — BP 100/60 | HR 85 | Ht 62.0 in | Wt 138.6 lb

## 2020-11-22 DIAGNOSIS — E7800 Pure hypercholesterolemia, unspecified: Secondary | ICD-10-CM

## 2020-11-22 DIAGNOSIS — I739 Peripheral vascular disease, unspecified: Secondary | ICD-10-CM

## 2020-11-22 DIAGNOSIS — I5032 Chronic diastolic (congestive) heart failure: Secondary | ICD-10-CM

## 2020-11-22 DIAGNOSIS — I10 Essential (primary) hypertension: Secondary | ICD-10-CM

## 2020-11-22 DIAGNOSIS — I350 Nonrheumatic aortic (valve) stenosis: Secondary | ICD-10-CM

## 2020-11-22 DIAGNOSIS — Z85068 Personal history of other malignant neoplasm of small intestine: Secondary | ICD-10-CM | POA: Diagnosis not present

## 2020-11-22 DIAGNOSIS — L821 Other seborrheic keratosis: Secondary | ICD-10-CM | POA: Diagnosis not present

## 2020-11-22 DIAGNOSIS — L820 Inflamed seborrheic keratosis: Secondary | ICD-10-CM | POA: Diagnosis not present

## 2020-11-22 NOTE — Addendum Note (Signed)
Addended by: Antonieta Iba on: 11/22/2020 10:59 AM   Modules accepted: Orders

## 2020-11-22 NOTE — Addendum Note (Signed)
Addended by: Antonieta Iba on: 11/22/2020 11:01 AM   Modules accepted: Orders

## 2020-11-22 NOTE — Patient Instructions (Signed)
Medication Instructions:  Your physician recommends that you continue on your current medications as directed. Please refer to the Current Medication list given to you today.  *If you need a refill on your cardiac medications before your next appointment, please call your pharmacy*   Lab Work: TODAY: BMET, BNP If you have labs (blood work) drawn today and your tests are completely normal, you will receive your results only by: Marland Kitchen MyChart Message (if you have MyChart) OR . A paper copy in the mail If you have any lab test that is abnormal or we need to change your treatment, we will call you to review the results.  Follow-Up: At [facility], you and your health needs are our priority.  As part of our continuing mission to provide you with exceptional heart care, we have created designated Provider Care Teams.  These Care Teams include your primary Cardiologist (physician) and Advanced Practice Providers (APPs -  Physician Assistants and Nurse Practitioners) who all work together to provide you with the care you need, when you need it.   Your next appointment:   6 month(s)  The format for your next appointment:   In Person  Provider:   You may see Fransico Him, MD or one of the following Advanced Practice Providers on your designated Care Team:    Melina Copa, PA-C  Ermalinda Barrios, PA-C

## 2020-11-22 NOTE — Progress Notes (Signed)
Cardiology Office Note:    Date:  11/22/2020   ID:  [name], DOB 06-Aug-1931, MRN [id number]  PCP:  [name], MD  Cardiologist:  Fransico Him, MD    Referring MD: [name], MD   Chief Complaint  Patient presents with  . Hypertension  . Hyperlipidemia  . Congestive Heart Failure  . Aortic Stenosis    History of Present Illness:    [name] is a 86 y.o. female with a hx of chronic SOB secondary to diastolic dysfunction, chronic diastolic CHF, deconditioning and obesity.Aheart cath in 2006 showed normal coronary arteries and normal LVF with normal LVEDP.She also has a history of HTN,mild aortic stenosis on echo 05/2016,chronic LE edema and PVD followed by [name].  She is here today for followup.  She has chronic DOE felt multifactorial in the past from CHF, sedentary state with deconditioning and obesity. She thinks the SOB has gotten worse as well.  She says that 2 days ago she went down for breakfast she walked down and back to the cafeteria and by the time she got back to her room she was exhausted and SOB.  This seems to be progressing. She has chronic LE edema which she thinks has gotten worse.  She has chronic LE edema which is stable.  She has had problems with dizziness in that the rooms spins and she feels off balance.  She has leg weakness and her legs shake when she tries to walk.  She has PT working with her in SNF.  She denies any chest pain or pressure,  PND, orthopnea,  palpitations or syncope. She is compliant with her meds and is tolerating meds with no SE.    Past Medical History:  Diagnosis Date  . Abnormal nuclear stress test   . Actinic cheilitis 04/23/2019  . Anemia    iron deficiency   . Anxiety   . Aortic stenosis    severe by echo 2021   . Arthritis   . Broken arm    right   . Chronic diastolic CHF (congestive heart failure) ([place])   . Chronic pain   . Cognitive communication deficit   . Constipation   . Depression    . Erosive (osteo)arthritis 03/09/2020  . Family history of adverse reaction to anesthesia    pt. states sister vomits  . Fibromyalgia   . GERD (gastroesophageal reflux disease)   . H/O hiatal hernia   . Hallucinations, visual 07/21/2018   03/10/20 psych consult.   . Hematuria 05/18/2020  . History of bronchitis   . History of kidney stones   . Hypercholesterolemia   . Hypertension    dr t Akiel Fennell  . Hypothyroidism   . Memory loss 04/21/2018   08/08/18 CT head no traumatic findings, atrophy.  09/27/19 MMSE 25/30, failed the clock drawing  . Microhematuria 03/24/2015  . Neuropathy   . Osteoporosis   . Parkinson disease ([place])   . PVD (peripheral vascular disease) ([facility])    99% stenosis of left anteiror tibial artery, mod stenosis of left distal SFA and popliteal artery followed by [name]  . RBBB    noted on EKG 2018  . Restless leg syndrome   . Sepsis ([place])    hx of due to [facility]  . Septic shock ([place]) 04/19/2020  . Shortness of breath    with exertion - chronic   . Sjogren's disease ([place])   . Sjogren's syndrome [facility]) 02/04/2018   04/16/19 rheumatology: Erosive  OA of hands, Sjogrens syndrome, low back pain at multiple sites, recurrent kidney stones, age related osteoporosis w/o current pathological fracture, f/u 6 months.   . SOB (shortness of breath)    chronic due to diastolic dysfunction, deconditioning, obesity  . Spinal stenosis    lumbar region   . Tremor   . Unstable gait 04/21/2018  . Unsteadiness on feet   . Urinary frequency 09/18/2018  . Urinary tract infection   . Visual hallucinations     Past Surgical History:  Procedure Laterality Date  . ABDOMINAL HYSTERECTOMY    . BACK SURGERY     4 back surgeries,   lumbar fusion  . CARDIAC CATHETERIZATION  2006   normal  . CYSTOSCOPY/URETEROSCOPY/HOLMIUM LASER/STENT PLACEMENT Left 01/06/2019   Procedure: CYSTOSCOPY/RETROGRADE/URETEROSCOPY/HOLMIUM LASER/BASKET RETRIEVAL/STENT PLACEMENT;  Surgeon: Ceasar Mons, MD;   Location: [facility];  Service: Urology;  Laterality: Left;  . CYSTOSCOPY/URETEROSCOPY/HOLMIUM LASER/STENT PLACEMENT Bilateral 05/24/2020   Procedure: CYSTOSCOPY/RETROGRADE/URETEROSCOPY/HOLMIUM LASER/STENT PLACEMENT;  Surgeon: Ceasar Mons, MD;  Location: [facility];  Service: Urology;  Laterality: Bilateral;  . EYE SURGERY Bilateral    cateracts  . falls     variious fall, broken wrist,and toes  . FRACTURE SURGERY Right April 2016   Wrist, Pt. fell  . IR NEPHROSTOMY PLACEMENT RIGHT  04/19/2020  . kidney stone removal Left 01/20/2019  . RIGHT/LEFT HEART CATH AND CORONARY ANGIOGRAPHY N/A 02/05/2018   Procedure: RIGHT/LEFT HEART CATH AND CORONARY ANGIOGRAPHY;  Surgeon: Troy Sine, MD;  Location: [facility];  Service: Cardiovascular;  Laterality: N/A;  . SPINAL CORD STIMULATOR INSERTION N/A 09/09/2015   Procedure: LUMBAR SPINAL CORD STIMULATOR INSERTION;  Surgeon: Clydell Hakim, MD;  Location: [facility];  Service: Neurosurgery;  Laterality: N/A;  LUMBAR SPINAL CORD STIMULATOR INSERTION  . SPINE SURGERY  April 2013   Back X's 4  . TOTAL HIP ARTHROPLASTY     right  . WRIST FRACTURE SURGERY Bilateral     Current Medications: Current Meds  Medication Sig  . acetaminophen (TYLENOL) 500 MG tablet Take 500 mg by mouth daily as needed for mild pain. not to exceed 3 gm in 24 hours. As needed for arthritis pain  . acetaminophen (TYLENOL) 500 MG tablet Take 500 mg by mouth 2 (two) times daily.  Marland Kitchen albuterol (PROVENTIL HFA;VENTOLIN HFA) 108 (90 Base) MCG/ACT inhaler Inhale 2 puffs into the lungs every 6 (six) hours as needed for wheezing or shortness of breath.  Marland Kitchen aspirin EC 81 MG tablet Take 81 mg by mouth daily.   . Calcium Carb-Cholecalciferol (CALCIUM 600/VITAMIN D3) 600-800 MG-UNIT TABS Take 1 tablet by mouth daily.  . carbidopa-levodopa (SINEMET IR) 25-100 MG tablet Take 2 tablets by mouth in the morning and at bedtime.   . cephALEXin (KEFLEX) 250 MG capsule Take 250 mg by mouth  daily.   . Cholecalciferol (VITAMIN D) 50 MCG (2000 UT) tablet Take 2,000 Units by mouth daily.  Marland Kitchen docusate sodium (COLACE) 100 MG capsule Take 100 mg by mouth at bedtime.  . famotidine (PEPCID) 20 MG tablet Take 20 mg by mouth daily.  . Ferrous Sulfate (SLOW FE) 142 (45 Fe) MG TBCR Take 1 tablet by mouth. Daily  . furosemide (LASIX) 20 MG tablet Take 20 mg by mouth daily.  . hydroxychloroquine (PLAQUENIL) 200 MG tablet Take 200 mg by mouth daily.  Marland Kitchen levothyroxine (SYNTHROID, LEVOTHROID) 50 MCG tablet Take 50 mcg by mouth daily before breakfast.   . lubiprostone (AMITIZA) 24 MCG capsule Take 24 mcg by  mouth 2 (two) times daily with a meal.   . Multiple Vitamins-Minerals (MULTIVITAMIN WITH MINERALS) tablet Take 1 tablet by mouth daily.  . Multiple Vitamins-Minerals (PRESERVISION AREDS 2 PO) Take 1 tablet by mouth daily.  Marland Kitchen nystatin (MYCOSTATIN/NYSTOP) powder Apply topically 2 (two) times daily as needed.  . pantoprazole (PROTONIX) 40 MG tablet Take 40 mg by mouth daily.  Vladimir Faster Glycol-Propyl Glycol (SYSTANE) 0.4-0.3 % SOLN Apply 1 drop to eye 2 (two) times daily as needed (dry eyes).   . potassium citrate (UROCIT-K) 10 MEQ (1080 MG) SR tablet Take 10 mEq by mouth 2 (two) times daily.  . pramipexole (MIRAPEX) 1 MG tablet Take 1 mg by mouth 2 (two) times daily.  . QUEtiapine (SEROQUEL) 25 MG tablet Take 12.5 mg by mouth every morning. 1/2 tablet to = 12.5 mg QAM  . QUEtiapine (SEROQUEL) 25 MG tablet Take 25 mg by mouth daily.  . rosuvastatin (CRESTOR) 20 MG tablet Take 20 mg by mouth daily.  Marland Kitchen saccharomyces boulardii (FLORASTOR) 250 MG capsule Take 250 mg by mouth daily.   Marland Kitchen senna-docusate (SENOKOT-S) 8.6-50 MG tablet Take 1 tablet by mouth at bedtime.   . sertraline (ZOLOFT) 50 MG tablet Take 75 mg by mouth at bedtime. 1-1/2 tablets to = 75 mg  . Skin Protectants, Misc. (EUCERIN) cream Apply 1 application topically daily.  Marland Kitchen Spacer/Aero-Holding Chambers (AEROCHAMBER MV) inhaler by Other  route. Use as instructed as needed     Allergies:   Adrenalone, Lactose, Morphine, Sulfa antibiotics, Codeine, Lactose intolerance (gi), Latex, Lyrica [pregabalin], Other, Plaquenil [hydroxychloroquine sulfate], Reglan [metoclopramide], Requip [ropinirole hcl], Septra [sulfamethoxazole-trimethoprim], and Shellfish allergy   Social History   Socioeconomic History  . Marital status: Married    Spouse name: [name]  . Number of children: 2  . Years of education: HS  . Highest education level: Not on file  Occupational History    Comment: Homemaker  Tobacco Use  . Smoking status: Never Smoker  . Smokeless tobacco: Never Used  Vaping Use  . Vaping Use: Never used  Substance and Sexual Activity  . Alcohol use: No    Alcohol/week: 0.0 standard drinks  . Drug use: No  . Sexual activity: Never  Other Topics Concern  . Not on file  Social History Narrative   Patient lives at home with her spouse. Married in 1957. Two people lives in the home, no pets.    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    Caffeine Use: tea   Social Determinants of Radio broadcast assistant Strain: Not on file  Food Insecurity: Not on file  Transportation Needs: Not on file  Physical Activity: Not on file  Stress: Not on file  Social Connections: Not on file     Family History: The patient's family history includes COPD in her father; Heart attack in her mother; Heart disease in her mother; Heart failure in her mother; Hypertension in her mother; Restless legs syndrome in her mother.  ROS:   Please see the history of present illness.    ROS  All other systems reviewed and negative.   EKGs/Labs/Other Studies Reviewed:    The following studies were reviewed today: EKG, prior OV notes  EKG:  EKG is  ordered today.  The ekg ordered today demonstrates NSR with RBBB  Recent Labs: 04/23/2020: Magnesium 2.5 10/05/2020: ALT 15; BUN 17; Creatinine  0.9; Hemoglobin 9.6; Platelets 288; Potassium 4.2; Sodium 142; TSH  1.04   Recent Lipid Panel    Component Value Date/Time   CHOL 105 10/05/2020 0000   CHOL 118 07/14/2019 0811   TRIG 119 10/05/2020 0000   HDL 46 10/05/2020 0000   HDL 50 07/14/2019 0811   CHOLHDL 2.4 07/14/2019 0811   CHOLHDL 2.7 07/01/2018 0715   LDLCALC 38 10/05/2020 0000   LDLCALC 32 07/14/2019 0811   LDLCALC 77 07/01/2018 0715    Physical Exam:    VS:  BP 100/60   Pulse 85   Ht 5\' 2"  (1.575 m)   Wt 138 lb 9.6 oz (62.9 kg)   SpO2 98%   BMI 25.35 kg/m     Wt Readings from Last 3 Encounters:  11/22/20 138 lb 9.6 oz (62.9 kg)  11/16/20 137 lb 1.6 oz (62.2 kg)  10/12/20 136 lb 4.8 oz (61.8 kg)     GEN: Well nourished, well developed in no acute distress HEENT: Normal NECK: No JVD; No carotid bruits LYMPHATICS: No lymphadenopathy CARDIAC:RRR, no murmurs, rubs, gallops RESPIRATORY:  Clear to auscultation without rales, wheezing or rhonchi  ABDOMEN: Soft, non-tender, non-distended MUSCULOSKELETAL:  Trace LE edema; No deformity  SKIN: Warm and dry NEUROLOGIC:  Alert and oriented x 3 PSYCHIATRIC:  Normal affect    ASSESSMENT:    1. Chronic diastolic CHF (congestive heart failure) ([place])   2. Primary hypertension   3. PVD (peripheral vascular disease) ([place])   4. Nonrheumatic aortic valve stenosis   5. Hypercholesterolemia    PLAN:    In order of problems listed above:  1  Chronic diastolic CHF  -this is exacerbated by severe AS -she appears euvolemic on exam today with only trace edema -She complains of chronic LE edema by the end of the day - she is very sedentary due to progression of Parkinson's so her LE edema is due to chronic dependent edema from sitting all day -she thinks that her LE edema is worse but I'm not very impressed by her edema today -encouraged her to wear compression hose during the day to help with edema although her edema is minimal today on her feet -continue Lasix 20mg   daily and take an additional Lasix 20mg  PRN for excess LE edema -will check BNP given her worsening SOB but suspect that her SOB is related to severe frailty, deconditioning, AS and sedentary state.  2.  HTN  -BB is adequately controlled on exam today.  -continue Lopressor 25mg  BID  3.  PVD  - s/p 99% stenosis of left anteiror tibial artery, mod stenosis of left distal SFA and popliteal artery followed by [name].   -continue statin  4.  Aortic stenosis -severe symptomatic Stage D1 AS with mean AVG 29mmHg by echo 11/2020 -evaluated in structural heart clinic and felt not to be a candidate for TAVR due to generalized deconditioning/severe Frailty and advanced age -now in in SNF  5.  Hyperlipidemia  - LDL goal < 70 given PVD.   -continue Zetia 10mg  daily and lovastatin 40mg  daily -LDL was 38 in Oct 2021  Medication Adjustments/Labs and Tests Ordered: Current medicines are reviewed at length with the patient today.  Concerns regarding medicines are outlined above.  No orders of the defined types were placed in this encounter.  No orders of the defined types were placed in this encounter.   Signed, Fransico Him, MD  11/22/2020 10:46 AM    [place]

## 2020-11-23 LAB — BASIC METABOLIC PANEL
BUN/Creatinine Ratio: 17 (ref 12–28)
BUN: 17 mg/dL (ref 8–27)
CO2: 22 mmol/L (ref 20–29)
Calcium: 10 mg/dL (ref 8.7–10.3)
Chloride: 104 mmol/L (ref 96–106)
Creatinine, Ser: 1.01 mg/dL — ABNORMAL HIGH (ref 0.57–1.00)
GFR calc Af Amer: 58 mL/min/{1.73_m2} — ABNORMAL LOW (ref >59)
GFR calc non Af Amer: 50 mL/min/{1.73_m2} — ABNORMAL LOW (ref >59)
Glucose: 104 mg/dL — ABNORMAL HIGH (ref 65–99)
Potassium: 4.2 mmol/L (ref 3.5–5.2)
Sodium: 141 mmol/L (ref 134–144)

## 2020-11-23 LAB — PRO B NATRIURETIC PEPTIDE: NT-Pro BNP: 426 pg/mL (ref 0–738)

## 2020-11-30 DIAGNOSIS — Z1231 Encounter for screening mammogram for malignant neoplasm of breast: Secondary | ICD-10-CM | POA: Diagnosis not present

## 2020-11-30 LAB — HM MAMMOGRAPHY

## 2020-12-01 ENCOUNTER — Encounter: Payer: Self-pay | Admitting: *Deleted

## 2020-12-19 ENCOUNTER — Encounter: Payer: Self-pay | Admitting: Internal Medicine

## 2020-12-19 NOTE — Progress Notes (Signed)
A user error has taken place.

## 2020-12-20 ENCOUNTER — Non-Acute Institutional Stay (SKILLED_NURSING_FACILITY): Payer: Medicare Other | Admitting: Internal Medicine

## 2020-12-20 ENCOUNTER — Encounter: Payer: Self-pay | Admitting: Internal Medicine

## 2020-12-20 DIAGNOSIS — D500 Iron deficiency anemia secondary to blood loss (chronic): Secondary | ICD-10-CM

## 2020-12-20 DIAGNOSIS — G20A1 Parkinson's disease without dyskinesia, without mention of fluctuations: Secondary | ICD-10-CM

## 2020-12-20 DIAGNOSIS — I5032 Chronic diastolic (congestive) heart failure: Secondary | ICD-10-CM

## 2020-12-20 DIAGNOSIS — G20 Parkinson's disease: Secondary | ICD-10-CM

## 2020-12-20 DIAGNOSIS — K5909 Other constipation: Secondary | ICD-10-CM

## 2020-12-20 DIAGNOSIS — K219 Gastro-esophageal reflux disease without esophagitis: Secondary | ICD-10-CM

## 2020-12-20 DIAGNOSIS — N390 Urinary tract infection, site not specified: Secondary | ICD-10-CM

## 2020-12-20 DIAGNOSIS — I350 Nonrheumatic aortic (valve) stenosis: Secondary | ICD-10-CM

## 2020-12-20 DIAGNOSIS — N200 Calculus of kidney: Secondary | ICD-10-CM

## 2020-12-20 DIAGNOSIS — E039 Hypothyroidism, unspecified: Secondary | ICD-10-CM

## 2020-12-20 DIAGNOSIS — R441 Visual hallucinations: Secondary | ICD-10-CM

## 2020-12-20 DIAGNOSIS — M154 Erosive (osteo)arthritis: Secondary | ICD-10-CM

## 2020-12-20 DIAGNOSIS — F323 Major depressive disorder, single episode, severe with psychotic features: Secondary | ICD-10-CM

## 2020-12-20 NOTE — Progress Notes (Signed)
Location:   [facility] Room Number: 45 Place of Service:  SNF [phone]) Provider:  [name] MD  [name], MD  Patient Care Team: [name], MD as PCP - General (Internal Medicine) [name], MD as PCP - Cardiology (Cardiology) [name], MD (Neurosurgery) [name], MD (Neurology) [name], MD (Inactive) as Consulting Physician (Gastroenterology) [name], MD as Consulting Physician (Urology) [name], Man X, NP as Nurse Practitioner (Internal Medicine)  Extended Emergency Contact Information Primary Emergency Contact: [facility] Address: [address]          [place], [place] [address] [name] of [place] Phone: [phone] Mobile Phone: [phone] Relation: Spouse Secondary Emergency Contact: [name] Mobile Phone: [phone] Relation: Daughter Preferred language: [name] Interpreter needed? No  Code Status:  Full Code Goals of care: Advanced Directive information Advanced Directives 12/20/2020  Does Patient Have a Medical Advance Directive? Yes  Type of Advance Directive Out of facility DNR (pink MOST or yellow form);Living will  Does patient want to make changes to medical advance directive? No - Patient declined  Copy of [place] in Chart? -  Would patient like information on creating a medical advance directive? -  Pre-existing out of facility DNR order (yellow form or pink MOST form) Yellow form placed in chart (order not valid for inpatient use)     Chief Complaint  Patient presents with  . Medical Management of Chronic Issues    HPI:  Pt is a 87 y.o. female seen today for medical management of chronic diseases.    Lives in SNF  Renal Stones with Recurent UTI Doing well on Keflex No New Pain or Hematuria Follow up with Urology pending Dr [name] Erosive arthritis Symptoms are controlled Follows with Dr Trudie Reed Parkinson disease with  Hallucinations Physically doing really well. Now able to do some of her ADLS. Walk with her walker Per Husband most Hallucinations are in the evening Day time she is good Severe AS Continues to have issues with SOB and Fatigue Per cardiology Palliative approach Weight is stable  Also has h/o Peripheral Neuropathy, hypothyroid, GERD, osteoporosis, hyperlipidemia, gout,  Past Medical History:  Diagnosis Date  . Abnormal nuclear stress test   . Actinic cheilitis 04/23/2019  . Anemia    iron deficiency   . Anxiety   . Aortic stenosis    severe by echo 2021   . Arthritis   . Broken arm    right   . Chronic diastolic CHF (congestive heart failure) ([place])   . Chronic pain   . Cognitive communication deficit   . Constipation   . Depression   . Erosive (osteo)arthritis 03/09/2020  . Family history of adverse reaction to anesthesia    pt. states sister vomits  . Fibromyalgia   . GERD (gastroesophageal reflux disease)   . H/O hiatal hernia   . Hallucinations, visual 07/21/2018   03/10/20 psych consult.   . Hematuria 05/18/2020  . History of bronchitis   . History of kidney stones   . Hypercholesterolemia   . Hypertension    dr t turner  . Hypothyroidism   . Memory loss 04/21/2018   08/08/18 CT head no traumatic findings, atrophy.  09/27/19 MMSE 25/30, failed the clock drawing  . Microhematuria 03/24/2015  . Neuropathy   . Osteoporosis   . Parkinson disease ([place])   . PVD (peripheral vascular disease) ([facility])    99% stenosis of left anteiror tibial artery, mod  stenosis of left distal SFA and popliteal artery followed by [name]  . RBBB    noted on EKG 2018  . Restless leg syndrome   . Sepsis ([place])    hx of due to [facility]  . Septic shock ([place]) 04/19/2020  . Shortness of breath    with exertion - chronic   . Sjogren's disease ([place])   . Sjogren's syndrome [facility]) 02/04/2018   04/16/19 rheumatology: Erosive OA of hands, Sjogrens syndrome, low back pain at multiple sites, recurrent kidney  stones, age related osteoporosis w/o current pathological fracture, f/u 6 months.   . SOB (shortness of breath)    chronic due to diastolic dysfunction, deconditioning, obesity  . Spinal stenosis    lumbar region   . Tremor   . Unstable gait 04/21/2018  . Unsteadiness on feet   . Urinary frequency 09/18/2018  . Urinary tract infection   . Visual hallucinations    Past Surgical History:  Procedure Laterality Date  . ABDOMINAL HYSTERECTOMY    . BACK SURGERY     4 back surgeries,   lumbar fusion  . CARDIAC CATHETERIZATION  2006   normal  . CYSTOSCOPY/URETEROSCOPY/HOLMIUM LASER/STENT PLACEMENT Left 01/06/2019   Procedure: CYSTOSCOPY/RETROGRADE/URETEROSCOPY/HOLMIUM LASER/BASKET RETRIEVAL/STENT PLACEMENT;  Surgeon: Ceasar Mons, MD;  Location: [facility];  Service: Urology;  Laterality: Left;  . CYSTOSCOPY/URETEROSCOPY/HOLMIUM LASER/STENT PLACEMENT Bilateral 05/24/2020   Procedure: CYSTOSCOPY/RETROGRADE/URETEROSCOPY/HOLMIUM LASER/STENT PLACEMENT;  Surgeon: Ceasar Mons, MD;  Location: [facility];  Service: Urology;  Laterality: Bilateral;  . EYE SURGERY Bilateral    cateracts  . falls     variious fall, broken wrist,and toes  . FRACTURE SURGERY Right April 2016   Wrist, Pt. fell  . IR NEPHROSTOMY PLACEMENT RIGHT  04/19/2020  . kidney stone removal Left 01/20/2019  . RIGHT/LEFT HEART CATH AND CORONARY ANGIOGRAPHY N/A 02/05/2018   Procedure: RIGHT/LEFT HEART CATH AND CORONARY ANGIOGRAPHY;  Surgeon: Troy Sine, MD;  Location: [facility];  Service: Cardiovascular;  Laterality: N/A;  . SPINAL CORD STIMULATOR INSERTION N/A 09/09/2015   Procedure: LUMBAR SPINAL CORD STIMULATOR INSERTION;  Surgeon: Clydell Hakim, MD;  Location: [facility];  Service: Neurosurgery;  Laterality: N/A;  LUMBAR SPINAL CORD STIMULATOR INSERTION  . SPINE SURGERY  April 2013   Back X's 4  . TOTAL HIP ARTHROPLASTY     right  . WRIST FRACTURE SURGERY Bilateral     Allergies  Allergen  Reactions  . Adrenalone   . Lactose Other (See Comments)    abd pain, lactose intolerant  . Morphine Other (See Comments)  . Sulfa Antibiotics Other (See Comments)    Headache, very sick  . Codeine Other (See Comments)    Reaction:  Headaches and nightmares   . Lactose Intolerance (Gi) Nausea And Vomiting  . Latex Rash  . Lyrica [Pregabalin] Swelling and Other (See Comments)    Reaction:  Leg swelling  . Other Other (See Comments)    Pt states that pain medications give her nightmares.    . Plaquenil [Hydroxychloroquine Sulfate] Other (See Comments)    Reaction:  GI upset   . Reglan [Metoclopramide] Other (See Comments)    Reaction:  GI upset   . Requip [Ropinirole Hcl] Other (See Comments)    Reaction:  GI upset   . Septra [Sulfamethoxazole-Trimethoprim] Nausea And Vomiting  . Shellfish Allergy Nausea And Vomiting    Allergies as of 12/20/2020      Reactions   Adrenalone    Lactose Other (See Comments)  abd pain, lactose intolerant   Morphine Other (See Comments)   Sulfa Antibiotics Other (See Comments)   Headache, very sick   Codeine Other (See Comments)   Reaction:  Headaches and nightmares    Lactose Intolerance (gi) Nausea And Vomiting   Latex Rash   Lyrica [pregabalin] Swelling, Other (See Comments)   Reaction:  Leg swelling   Other Other (See Comments)   Pt states that pain medications give her nightmares.     Plaquenil [hydroxychloroquine Sulfate] Other (See Comments)   Reaction:  GI upset    Reglan [metoclopramide] Other (See Comments)   Reaction:  GI upset    Requip [ropinirole Hcl] Other (See Comments)   Reaction:  GI upset    Septra [sulfamethoxazole-trimethoprim] Nausea And Vomiting   Shellfish Allergy Nausea And Vomiting      Medication List       Accurate as of December 20, 2020 11:59 PM. If you have any questions, ask your nurse or doctor.        acetaminophen 500 MG tablet Commonly known as: TYLENOL Take 500 mg by mouth daily as needed for  mild pain. not to exceed 3 gm in 24 hours. As needed for arthritis pain   acetaminophen 500 MG tablet Commonly known as: TYLENOL Take 500 mg by mouth 2 (two) times daily.   AeroChamber MV inhaler by Other route. Use as instructed as needed   albuterol 108 (90 Base) MCG/ACT inhaler Commonly known as: VENTOLIN HFA Inhale 2 puffs into the lungs every 6 (six) hours as needed for wheezing or shortness of breath.   aspirin EC 81 MG tablet Take 81 mg by mouth daily.   Calcium 600/Vitamin D3 600-800 MG-UNIT Tabs Generic drug: Calcium Carb-Cholecalciferol Take 1 tablet by mouth daily.   carbidopa-levodopa 25-100 MG tablet Commonly known as: SINEMET IR Take 2 tablets by mouth in the morning and at bedtime.   cephALEXin 250 MG capsule Commonly known as: KEFLEX Take 250 mg by mouth daily.   docusate sodium 100 MG capsule Commonly known as: COLACE Take 100 mg by mouth at bedtime.   eucerin cream Apply 1 application topically daily.   famotidine 20 MG tablet Commonly known as: PEPCID Take 20 mg by mouth daily.   furosemide 20 MG tablet Commonly known as: LASIX Take 20 mg by mouth daily.   hydroxychloroquine 200 MG tablet Commonly known as: PLAQUENIL Take 200 mg by mouth daily.   levothyroxine 50 MCG tablet Commonly known as: SYNTHROID Take 50 mcg by mouth daily before breakfast.   lubiprostone 24 MCG capsule Commonly known as: AMITIZA Take 24 mcg by mouth 2 (two) times daily with a meal.   multivitamin with minerals tablet Take 1 tablet by mouth daily.   PRESERVISION AREDS 2 PO Take 1 tablet by mouth daily.   nystatin powder Commonly known as: MYCOSTATIN/NYSTOP Apply topically 2 (two) times daily as needed.   pantoprazole 40 MG tablet Commonly known as: PROTONIX Take 40 mg by mouth daily.   potassium citrate 10 MEQ (1080 MG) SR tablet Commonly known as: UROCIT-K Take 10 mEq by mouth 2 (two) times daily.   pramipexole 1 MG tablet Commonly known as:  MIRAPEX Take 1 mg by mouth 2 (two) times daily.   QUEtiapine 25 MG tablet Commonly known as: SEROQUEL Take 12.5 mg by mouth every morning. 1/2 tablet to = 12.5 mg QAM   QUEtiapine 25 MG tablet Commonly known as: SEROQUEL Take 25 mg by mouth daily.   rosuvastatin 20 MG  tablet Commonly known as: CRESTOR Take 20 mg by mouth daily.   saccharomyces boulardii 250 MG capsule Commonly known as: FLORASTOR Take 250 mg by mouth daily.   senna-docusate 8.6-50 MG tablet Commonly known as: Senokot-S Take 1 tablet by mouth at bedtime.   sertraline 50 MG tablet Commonly known as: ZOLOFT Take 75 mg by mouth at bedtime. 1-1/2 tablets to = 75 mg   Slow Fe 142 (45 Fe) MG Tbcr Generic drug: Ferrous Sulfate Take 1 tablet by mouth. Daily   Systane 0.4-0.3 % Soln Generic drug: Polyethyl Glycol-Propyl Glycol Apply 1 drop to eye 2 (two) times daily as needed (dry eyes).   Vitamin D 50 MCG (2000 UT) tablet Take 2,000 Units by mouth daily.       Review of Systems  Constitutional: Negative.   HENT: Negative.   Respiratory: Positive for shortness of breath.   Cardiovascular: Positive for leg swelling.  Gastrointestinal: Negative.   Genitourinary: Negative.   Musculoskeletal: Positive for gait problem.  Skin: Negative.   Neurological: Negative for dizziness.  Psychiatric/Behavioral: Positive for hallucinations.    Immunization History  Administered Date(s) Administered  . Hepatitis A, Adult 03/01/1997, 09/20/1997  . Influenza Split 09/09/2013, 09/27/2014  . Influenza, High Dose Seasonal PF 08/01/2011, 08/27/2012, 10/14/2013, 09/08/2015, 09/10/2016, 09/12/2017, 09/12/2018, 09/15/2019  . Influenza,inj,Quad PF,6+ Mos 09/11/2016  . Influenza-Unspecified 09/21/2020  . Moderna Sars-Covid-2 Vaccination 12/12/2019, 01/09/2020, 10/18/2020  . Pneumococcal Conjugate-13 04/15/2014  . Pneumococcal Polysaccharide-23 09/28/2005  . Td 06/20/2016  . Tdap 04/29/2006, 09/01/2015  . Zoster 04/29/2006   . Zoster Recombinat (Shingrix) 06/11/2018, 09/10/2018   Pertinent  Health Maintenance Due  Topic Date Due  . INFLUENZA VACCINE  Completed  . DEXA SCAN  Completed  . PNA vac Low Risk Adult  Completed   Fall Risk  11/16/2019 04/23/2019 04/09/2019 10/29/2018 10/27/2018  Falls in the past year? 0 0 0 0 1  Comment - - - fell June 2019 -  Number falls in past yr: - 0 0 - 0  Injury with Fall? - 0 0 - 1  Comment - - - - -  Risk for fall due to : - - - - -  Risk for fall due to: Comment - - - - -   Functional Status Survey:    Vitals:   12/20/20 1501  BP: 118/62  Pulse: 82  Resp: 18  Temp: 98.7 F (37.1 C)  Weight: 138 lb (62.6 kg)   Body mass index is 25.24 kg/m. Physical Exam Vitals reviewed.  Constitutional:      Appearance: Normal appearance.  HENT:     Head: Normocephalic.     Nose: Nose normal.     Mouth/Throat:     Mouth: Mucous membranes are moist.     Pharynx: Oropharynx is clear.  Eyes:     Pupils: Pupils are equal, round, and reactive to light.  Cardiovascular:     Rate and Rhythm: Normal rate and regular rhythm.     Pulses: Normal pulses.     Heart sounds: Murmur heard.    Pulmonary:     Effort: Pulmonary effort is normal.     Breath sounds: Normal breath sounds.  Abdominal:     General: Abdomen is flat. Bowel sounds are normal.     Palpations: Abdomen is soft.  Musculoskeletal:     Cervical back: Neck supple.     Comments: Mild edema Bilateral Continues to have swelling and Pain in her fingers  Skin:    General: Skin is  warm.  Neurological:     General: No focal deficit present.     Mental Status: She is alert and oriented to person, place, and time.  Psychiatric:        Mood and Affect: Mood normal.        Thought Content: Thought content normal.     Labs reviewed: Recent Labs    04/21/20 0325 04/22/20 0605 04/22/20 0903 04/23/20 0350 04/24/20 0423 04/25/20 0427 05/24/20 1015 08/04/20 0000 09/08/20 0000 10/05/20 0000 11/22/20 1109   NA 143  --    < > 144   < > 146* 141   < > 141 142 141  K 3.4*  --    < > 3.1*   < > 3.8 3.5   < > 4.1 4.2 4.2  CL 115*  --    < > 119*   < > 115* 107   < > 105 107 104  CO2 20*  --    < > 21*   < > 26 24   < > 26* 25* 22  GLUCOSE 129*  --    < > 80   < > 88 100*  --   --   --  104*  BUN 35*  --    < > 33*   < > 16 12   < > 15 17 17   CREATININE 1.23*  --    < > 1.01*   < > 0.81 0.81   < > 0.9 0.9 1.01*  CALCIUM 7.2*  --    < > 7.2*   < > 7.6* 8.9   < > 10.0 9.6 10.0  MG 3.0* 2.6*  --  2.5*  --   --   --   --   --   --   --   PHOS 3.2 2.6  --  2.3*  --   --   --   --   --   --   --    < > = values in this interval not displayed.   Recent Labs    04/20/20 0455 04/21/20 0325 04/24/20 0423 10/05/20 0000  AST 117* 50* 32 19  ALT 18 8 7 15   ALKPHOS 95 95 77 62  BILITOT 0.5 0.5 0.6  --   PROT 5.1* 5.4* 4.7*  --   ALBUMIN 2.3* 2.3* 2.3* 3.5   Recent Labs    04/24/20 0423 04/25/20 0427 04/26/20 0000 05/24/20 1015 06/21/20 0000 06/30/20 0000 08/04/20 0000 10/05/20 0000  WBC 14.5* 13.8*   < > 7.8   < > 9.1 6.2 4.8  NEUTROABS  --   --    < >  --    < > 6,406 3,677 2,328.00  HGB 8.8* 9.4*   < > 9.8*   < > 8.7* 8.9* 9.6*  HCT 27.1* 29.7*   < > 30.9*   < > 27* 28* 30*  MCV 97.5 96.7  --  98.4  --   --   --   --   PLT 194 226   < > 286   < > 443* 354 288   < > = values in this interval not displayed.   Lab Results  Component Value Date   TSH 1.04 10/05/2020   No results found for: HGBA1C Lab Results  Component Value Date   CHOL 105 10/05/2020   HDL 46 10/05/2020   LDLCALC 38 10/05/2020   TRIG 119 10/05/2020   CHOLHDL 2.4 07/14/2019  Significant Diagnostic Results in last 30 days:  No results found.  Assessment/Plan Chronic diastolic CHF (congestive heart failure) ([facility]) Doing well on Lasix Does get SOB and Tired with exertion but Overall doing really well Walking with her walker  Renal lithiasis Follows with Urology  Recurrent UTI Doing well on Keflex  Prophylactic  Gastroesophageal reflux disease, unspecified whether esophagitis present  Parkinson's disease ([place]) On Sinemet and Mirapex Doing well with her ADLS  Chronic constipation Continue Amitiza Iron deficiency anemia due to chronic blood loss  Erosive (osteo)arthritis On Plaquenil Follows with Rheumatology Nonrheumatic aortic valve stenosis Palliative Approach Follows with Cardolgy Stable right now  Hallucinations, visual with Parkinson Discontinue AM seroquel Continue PM dose  Hypothyroidism, unspecified type TSH normal in 10/21 Anemia HGB stable on iron   Family/ staff Communication:   Labs/tests ordered:    Discussed in detail with husband

## 2021-01-10 ENCOUNTER — Other Ambulatory Visit: Payer: Self-pay

## 2021-01-10 ENCOUNTER — Ambulatory Visit (INDEPENDENT_AMBULATORY_CARE_PROVIDER_SITE_OTHER): Payer: Medicare Other | Admitting: Podiatry

## 2021-01-10 DIAGNOSIS — M79674 Pain in right toe(s): Secondary | ICD-10-CM | POA: Diagnosis not present

## 2021-01-10 DIAGNOSIS — G629 Polyneuropathy, unspecified: Secondary | ICD-10-CM

## 2021-01-10 DIAGNOSIS — B351 Tinea unguium: Secondary | ICD-10-CM

## 2021-01-10 DIAGNOSIS — R609 Edema, unspecified: Secondary | ICD-10-CM

## 2021-01-10 DIAGNOSIS — M79675 Pain in left toe(s): Secondary | ICD-10-CM

## 2021-01-12 DIAGNOSIS — F060 Psychotic disorder with hallucinations due to known physiological condition: Secondary | ICD-10-CM | POA: Diagnosis not present

## 2021-01-15 NOTE — Progress Notes (Signed)
Subjective: 87 y.o. returns the office today for painful, elongated, thickened toenails which she cannot trim herself.  She presents today with her husband.  The Parkinson's seems to be progressing.  Gets occasional discomfort to her feet mostly at nighttime likely from neuropathy.  Denies any systemic complaints such as fevers, chills, nausea, vomiting.   PCP: [name], MD   Objective: AAO 3, NAD DP/PT pulses palpable, CRT less than 3 seconds Nails hypertrophic, dystrophic, elongated, brittle, discolored 10. There is tenderness overlying the nails 1-5 bilaterally except for the left hallux toenail does not present. There is no surrounding erythema or drainage along the nail sites. No open lesions or pre-ulcerative lesions are identified. I am not able to identify any area of pinpoint tenderness today.  The majority discomfort she gets this to her toes at nighttime.  Mild chronic edema present there is no erythema or warmth there is no open lesions.   No pain with calf compression, warmth, erythema.  Assessment: Patient presents with symptomatic onychomycosis, neuropathy  Plan: -Treatment options including alternatives, risks, complications were discussed -Nails sharply debrided 9 without complication/bleeding. -Given her other medications I would hold off of any oral medications for neuropathy.  We discussed over-the-counter creams that she can use including Aspercreme with lidocaine, capsaicin cream.  Also encouraged her to see her primary care physician for this as well. -In general given her other issues she is have with her feet continue with supportive shoes, offloading pads.  Compression socks help with swelling and elevation.  Return in about 3 months (around 04/09/2021).  Trula Slade DPM

## 2021-01-26 ENCOUNTER — Encounter: Payer: Self-pay | Admitting: Internal Medicine

## 2021-01-26 ENCOUNTER — Encounter: Payer: Self-pay | Admitting: Nurse Practitioner

## 2021-01-26 DIAGNOSIS — N200 Calculus of kidney: Secondary | ICD-10-CM

## 2021-01-26 DIAGNOSIS — F323 Major depressive disorder, single episode, severe with psychotic features: Secondary | ICD-10-CM

## 2021-01-26 DIAGNOSIS — I350 Nonrheumatic aortic (valve) stenosis: Secondary | ICD-10-CM

## 2021-01-26 DIAGNOSIS — E039 Hypothyroidism, unspecified: Secondary | ICD-10-CM

## 2021-01-26 DIAGNOSIS — M154 Erosive (osteo)arthritis: Secondary | ICD-10-CM

## 2021-01-26 DIAGNOSIS — K219 Gastro-esophageal reflux disease without esophagitis: Secondary | ICD-10-CM

## 2021-01-26 DIAGNOSIS — K5909 Other constipation: Secondary | ICD-10-CM

## 2021-01-26 DIAGNOSIS — G20 Parkinson's disease: Secondary | ICD-10-CM

## 2021-01-26 DIAGNOSIS — I5032 Chronic diastolic (congestive) heart failure: Secondary | ICD-10-CM

## 2021-01-26 DIAGNOSIS — D500 Iron deficiency anemia secondary to blood loss (chronic): Secondary | ICD-10-CM

## 2021-01-26 NOTE — Assessment & Plan Note (Signed)
takes Senokot S I qd, Amitiza 24mcg bid, Colace qd 

## 2021-01-26 NOTE — Assessment & Plan Note (Signed)
takes Fe, Hgb 9.6 10/04/20

## 2021-01-26 NOTE — Assessment & Plan Note (Addendum)
Stable, occasional visual hallucination, takes Sertraline, Quetiapine

## 2021-01-26 NOTE — Assessment & Plan Note (Signed)
takes Plaquenil 200mg qd, Tylenol 500mg bid, f/u Rheumatology.  

## 2021-01-26 NOTE — Progress Notes (Signed)
This encounter was created in error - please disregard.

## 2021-01-26 NOTE — Assessment & Plan Note (Signed)
Hx of rena lithiasis, recurrent UTI, urosepsis, takes Keflex for suppression therapy, f/u Urology 

## 2021-01-26 NOTE — Assessment & Plan Note (Signed)
takes Levothyroxine 59mcg qd.TSH 1.04 10/04/20

## 2021-01-26 NOTE — Assessment & Plan Note (Signed)
Cardiology palliative approach

## 2021-01-26 NOTE — Assessment & Plan Note (Signed)
takesPantoprazole 40mg  qd, Famotidine 20mg  qd.

## 2021-01-26 NOTE — Assessment & Plan Note (Signed)
takes MiraPex, Sinemet, ambulates with walker

## 2021-01-26 NOTE — Assessment & Plan Note (Signed)
/  trace edema BLE, takes Furosemide 20mg  qd, cardiology, severe aortic valve stenosis.Bun/creat 17/0.91 10/04/20. Echocardiogram 11/15/20 EF 60-65%

## 2021-01-30 ENCOUNTER — Encounter: Payer: Self-pay | Admitting: Nurse Practitioner

## 2021-01-30 NOTE — Progress Notes (Signed)
Location:   [facility] Room Number: 45 Place of Service:  SNF (31) Provider:  Marda Stalker, Lennie Odor NP  [name], MD  Patient Care Team: [name], MD as PCP - General (Internal Medicine) [name], MD as PCP - Cardiology (Cardiology) [name], MD (Neurosurgery) [name], MD (Neurology) [name], MD (Inactive) as Consulting Physician (Gastroenterology) [name], MD as Consulting Physician (Urology) [name], Man X, NP as Nurse Practitioner (Internal Medicine)  Extended Emergency Contact Information Primary Emergency Contact: [facility] Address: [address]          [place], [place] [address] [name] of [place] Phone: [phone] Mobile Phone: [phone] Relation: Spouse Secondary Emergency Contact: [name] Mobile Phone: [phone] Relation: Daughter Preferred language: [name] Interpreter needed? No  Code Status:  Full Code Goals of care: Advanced Directive information Advanced Directives 12/20/2020  Does Patient Have a Medical Advance Directive? Yes  Type of Advance Directive Out of facility DNR (pink MOST or yellow form);Living will  Does patient want to make changes to medical advance directive? No - Patient declined  Copy of [place] in Chart? -  Would patient like information on creating a medical advance directive? -  Pre-existing out of facility DNR order (yellow form or pink MOST form) Yellow form placed in chart (order not valid for inpatient use)     Chief Complaint  Patient presents with  . Medical Management of Chronic Issues    HPI:  Pt is a 87 y.o. female seen today for medical management of chronic diseases.     Past Medical History:  Diagnosis Date  . Abnormal nuclear stress test   . Actinic cheilitis 04/23/2019  . Anemia    iron deficiency   . Anxiety   . Aortic stenosis    severe by echo 2021   . Arthritis   . Broken arm     right   . Chronic diastolic CHF (congestive heart failure) ([place])   . Chronic pain   . Cognitive communication deficit   . Constipation   . Depression   . Erosive (osteo)arthritis 03/09/2020  . Family history of adverse reaction to anesthesia    pt. states sister vomits  . Fibromyalgia   . GERD (gastroesophageal reflux disease)   . H/O hiatal hernia   . Hallucinations, visual 07/21/2018   03/10/20 psych consult.   . Hematuria 05/18/2020  . History of bronchitis   . History of kidney stones   . Hypercholesterolemia   . Hypertension    dr t turner  . Hypothyroidism   . Memory loss 04/21/2018   08/08/18 CT head no traumatic findings, atrophy.  09/27/19 MMSE 25/30, failed the clock drawing  . Microhematuria 03/24/2015  . Neuropathy   . Osteoporosis   . Parkinson disease ([place])   . PVD (peripheral vascular disease) ([facility])    99% stenosis of left anteiror tibial artery, mod stenosis of left distal SFA and popliteal artery followed by [name]  . RBBB    noted on EKG 2018  . Restless leg syndrome   . Sepsis ([place])    hx of due to [facility]  . Septic shock ([place]) 04/19/2020  . Shortness of breath    with exertion - chronic   . Sjogren's disease ([place])   . Sjogren's syndrome [facility]) 02/04/2018   04/16/19 rheumatology: Erosive OA of hands, Sjogrens syndrome, low back pain at multiple sites, recurrent kidney stones, age related osteoporosis w/o current  pathological fracture, f/u 6 months.   . SOB (shortness of breath)    chronic due to diastolic dysfunction, deconditioning, obesity  . Spinal stenosis    lumbar region   . Tremor   . Unstable gait 04/21/2018  . Unsteadiness on feet   . Urinary frequency 09/18/2018  . Urinary tract infection   . Visual hallucinations    Past Surgical History:  Procedure Laterality Date  . ABDOMINAL HYSTERECTOMY    . BACK SURGERY     4 back surgeries,   lumbar fusion  . CARDIAC CATHETERIZATION  2006   normal  . CYSTOSCOPY/URETEROSCOPY/HOLMIUM LASER/STENT PLACEMENT  Left 01/06/2019   Procedure: CYSTOSCOPY/RETROGRADE/URETEROSCOPY/HOLMIUM LASER/BASKET RETRIEVAL/STENT PLACEMENT;  Surgeon: Ceasar Mons, MD;  Location: [facility];  Service: Urology;  Laterality: Left;  . CYSTOSCOPY/URETEROSCOPY/HOLMIUM LASER/STENT PLACEMENT Bilateral 05/24/2020   Procedure: CYSTOSCOPY/RETROGRADE/URETEROSCOPY/HOLMIUM LASER/STENT PLACEMENT;  Surgeon: Ceasar Mons, MD;  Location: [facility];  Service: Urology;  Laterality: Bilateral;  . EYE SURGERY Bilateral    cateracts  . falls     variious fall, broken wrist,and toes  . FRACTURE SURGERY Right April 2016   Wrist, Pt. fell  . IR NEPHROSTOMY PLACEMENT RIGHT  04/19/2020  . kidney stone removal Left 01/20/2019  . RIGHT/LEFT HEART CATH AND CORONARY ANGIOGRAPHY N/A 02/05/2018   Procedure: RIGHT/LEFT HEART CATH AND CORONARY ANGIOGRAPHY;  Surgeon: Troy Sine, MD;  Location: [facility];  Service: Cardiovascular;  Laterality: N/A;  . SPINAL CORD STIMULATOR INSERTION N/A 09/09/2015   Procedure: LUMBAR SPINAL CORD STIMULATOR INSERTION;  Surgeon: Clydell Hakim, MD;  Location: [facility];  Service: Neurosurgery;  Laterality: N/A;  LUMBAR SPINAL CORD STIMULATOR INSERTION  . SPINE SURGERY  April 2013   Back X's 4  . TOTAL HIP ARTHROPLASTY     right  . WRIST FRACTURE SURGERY Bilateral     Allergies  Allergen Reactions  . Adrenalone   . Lactose Other (See Comments)    abd pain, lactose intolerant  . Morphine Other (See Comments)  . Sulfa Antibiotics Other (See Comments)    Headache, very sick  . Codeine Other (See Comments)    Reaction:  Headaches and nightmares   . Lactose Intolerance (Gi) Nausea And Vomiting  . Latex Rash  . Lyrica [Pregabalin] Swelling and Other (See Comments)    Reaction:  Leg swelling  . Other Other (See Comments)    Pt states that pain medications give her nightmares.    . Plaquenil [Hydroxychloroquine Sulfate] Other (See Comments)    Reaction:  GI upset   . Reglan  [Metoclopramide] Other (See Comments)    Reaction:  GI upset   . Requip [Ropinirole Hcl] Other (See Comments)    Reaction:  GI upset   . Septra [Sulfamethoxazole-Trimethoprim] Nausea And Vomiting  . Shellfish Allergy Nausea And Vomiting    Allergies as of 01/26/2021      Reactions   Adrenalone    Lactose Other (See Comments)   abd pain, lactose intolerant   Morphine Other (See Comments)   Sulfa Antibiotics Other (See Comments)   Headache, very sick   Codeine Other (See Comments)   Reaction:  Headaches and nightmares    Lactose Intolerance (gi) Nausea And Vomiting   Latex Rash   Lyrica [pregabalin] Swelling, Other (See Comments)   Reaction:  Leg swelling   Other Other (See Comments)   Pt states that pain medications give her nightmares.     Plaquenil [hydroxychloroquine Sulfate] Other (See Comments)   Reaction:  GI upset  Reglan [metoclopramide] Other (See Comments)   Reaction:  GI upset    Requip [ropinirole Hcl] Other (See Comments)   Reaction:  GI upset    Septra [sulfamethoxazole-trimethoprim] Nausea And Vomiting   Shellfish Allergy Nausea And Vomiting      Medication List       Accurate as of January 26, 2021 11:59 PM. If you have any questions, ask your nurse or doctor.        acetaminophen 500 MG tablet Commonly known as: TYLENOL Take 500 mg by mouth daily as needed for mild pain. not to exceed 3 gm in 24 hours. As needed for arthritis pain   acetaminophen 500 MG tablet Commonly known as: TYLENOL Take 500 mg by mouth 2 (two) times daily.   AeroChamber MV inhaler by Other route. Use as instructed as needed   albuterol 108 (90 Base) MCG/ACT inhaler Commonly known as: VENTOLIN HFA Inhale 2 puffs into the lungs every 6 (six) hours as needed for wheezing or shortness of breath.   aspirin EC 81 MG tablet Take 81 mg by mouth daily.   Calcium 600/Vitamin D3 600-800 MG-UNIT Tabs Generic drug: Calcium Carb-Cholecalciferol Take 1 tablet by mouth daily.    carbidopa-levodopa 25-100 MG tablet Commonly known as: SINEMET IR Take 2 tablets by mouth in the morning and at bedtime.   cephALEXin 250 MG capsule Commonly known as: KEFLEX Take 250 mg by mouth daily.   docusate sodium 100 MG capsule Commonly known as: COLACE Take 100 mg by mouth at bedtime.   eucerin cream Apply 1 application topically daily.   famotidine 20 MG tablet Commonly known as: PEPCID Take 20 mg by mouth daily.   furosemide 20 MG tablet Commonly known as: LASIX Take 20 mg by mouth daily as needed.   hydroxychloroquine 200 MG tablet Commonly known as: PLAQUENIL Take 200 mg by mouth daily.   lactose free nutrition Liqd Take 237 mLs by mouth as needed.   levothyroxine 50 MCG tablet Commonly known as: SYNTHROID Take 50 mcg by mouth daily before breakfast.   lubiprostone 24 MCG capsule Commonly known as: AMITIZA Take 24 mcg by mouth 2 (two) times daily with a meal.   MULTIPLE MINERALS-VITAMINS PO Take 1 tablet by mouth daily.   nystatin powder Commonly known as: MYCOSTATIN/NYSTOP Apply topically 2 (two) times daily as needed.   pantoprazole 40 MG tablet Commonly known as: PROTONIX Take 40 mg by mouth daily.   potassium citrate 10 MEQ (1080 MG) SR tablet Commonly known as: UROCIT-K Take 10 mEq by mouth 2 (two) times daily.   pramipexole 1 MG tablet Commonly known as: MIRAPEX Take 1 mg by mouth 2 (two) times daily.   PRESERVISION AREDS 2 PO Take 1 tablet by mouth daily. What changed: Another medication with the same name was removed. Continue taking this medication, and follow the directions you see here. Changed by: [name], MD   QUEtiapine 25 MG tablet Commonly known as: SEROQUEL Take 25 mg by mouth daily. What changed: Another medication with the same name was removed. Continue taking this medication, and follow the directions you see here. Changed by: [name], MD   rosuvastatin 20 MG tablet Commonly known as: CRESTOR Take  10 mg by mouth daily.   saccharomyces boulardii 250 MG capsule Commonly known as: FLORASTOR Take 250 mg by mouth daily.   senna-docusate 8.6-50 MG tablet Commonly known as: Senokot-S Take 1 tablet by mouth at bedtime.   sertraline 50 MG tablet Commonly known  as: ZOLOFT Take 75 mg by mouth at bedtime. 1-1/2 tablets to = 75 mg   Slow Fe 142 (45 Fe) MG Tbcr Generic drug: Ferrous Sulfate Take 1 tablet by mouth. Daily   Systane 0.4-0.3 % Soln Generic drug: Polyethyl Glycol-Propyl Glycol Apply 1 drop to eye 2 (two) times daily as needed (dry eyes).   Vitamin D 50 MCG (2000 UT) tablet Take 2,000 Units by mouth daily.       Review of Systems  Immunization History  Administered Date(s) Administered  . Hepatitis A, Adult 03/01/1997, 09/20/1997  . Influenza Split 09/09/2013, 09/27/2014  . Influenza, High Dose Seasonal PF 08/01/2011, 08/27/2012, 10/14/2013, 09/08/2015, 09/10/2016, 09/12/2017, 09/12/2018, 09/15/2019  . Influenza,inj,Quad PF,6+ Mos 09/11/2016  . Influenza-Unspecified 09/21/2020  . Moderna Sars-Covid-2 Vaccination 12/12/2019, 01/09/2020, 10/18/2020  . Pneumococcal Conjugate-13 04/15/2014  . Pneumococcal Polysaccharide-23 09/28/2005  . Td 06/20/2016  . Tdap 04/29/2006, 09/01/2015  . Zoster 04/29/2006  . Zoster Recombinat (Shingrix) 06/11/2018, 09/10/2018   Pertinent  Health Maintenance Due  Topic Date Due  . INFLUENZA VACCINE  Completed  . DEXA SCAN  Completed  . PNA vac Low Risk Adult  Completed   Fall Risk  11/16/2019 04/23/2019 04/09/2019 10/29/2018 10/27/2018  Falls in the past year? 0 0 0 0 1  Comment - - - fell June 2019 -  Number falls in past yr: - 0 0 - 0  Injury with Fall? - 0 0 - 1  Comment - - - - -  Risk for fall due to : - - - - -  Risk for fall due to: Comment - - - - -   Functional Status Survey:    Vitals:   01/30/21 0850  BP: 102/60  Pulse: 84  Resp: 18  Temp: 98 F (36.7 C)  SpO2: 96%  Weight: 138 lb 9.6 oz (62.9 kg)  Height: 5'  2" (1.575 m)   Body mass index is 25.35 kg/m. Physical Exam  Labs reviewed: Recent Labs    04/21/20 0325 04/22/20 0605 04/22/20 0903 04/23/20 0350 04/24/20 0423 04/25/20 0427 05/24/20 1015 08/04/20 0000 09/08/20 0000 10/05/20 0000 11/22/20 1109  NA 143  --    < > 144   < > 146* 141   < > 141 142 141  K 3.4*  --    < > 3.1*   < > 3.8 3.5   < > 4.1 4.2 4.2  CL 115*  --    < > 119*   < > 115* 107   < > 105 107 104  CO2 20*  --    < > 21*   < > 26 24   < > 26* 25* 22  GLUCOSE 129*  --    < > 80   < > 88 100*  --   --   --  104*  BUN 35*  --    < > 33*   < > 16 12   < > 15 17 17   CREATININE 1.23*  --    < > 1.01*   < > 0.81 0.81   < > 0.9 0.9 1.01*  CALCIUM 7.2*  --    < > 7.2*   < > 7.6* 8.9   < > 10.0 9.6 10.0  MG 3.0* 2.6*  --  2.5*  --   --   --   --   --   --   --   PHOS 3.2 2.6  --  2.3*  --   --   --   --   --   --   --    < > =  values in this interval not displayed.   Recent Labs    04/20/20 0455 04/21/20 0325 04/24/20 0423 10/05/20 0000  AST 117* 50* 32 19  ALT 18 8 7 15   ALKPHOS 95 95 77 62  BILITOT 0.5 0.5 0.6  --   PROT 5.1* 5.4* 4.7*  --   ALBUMIN 2.3* 2.3* 2.3* 3.5   Recent Labs    04/24/20 0423 04/25/20 0427 04/26/20 0000 05/24/20 1015 06/21/20 0000 06/30/20 0000 08/04/20 0000 10/05/20 0000  WBC 14.5* 13.8*   < > 7.8   < > 9.1 6.2 4.8  NEUTROABS  --   --    < >  --    < > 6,406 3,677 2,328.00  HGB 8.8* 9.4*   < > 9.8*   < > 8.7* 8.9* 9.6*  HCT 27.1* 29.7*   < > 30.9*   < > 27* 28* 30*  MCV 97.5 96.7  --  98.4  --   --   --   --   PLT 194 226   < > 286   < > 443* 354 288   < > = values in this interval not displayed.   Lab Results  Component Value Date   TSH 1.04 10/05/2020   No results found for: HGBA1C Lab Results  Component Value Date   CHOL 105 10/05/2020   HDL 46 10/05/2020   LDLCALC 38 10/05/2020   TRIG 119 10/05/2020   CHOLHDL 2.4 07/14/2019    Significant Diagnostic Results in last 30 days:  No results  found.  Assessment/Plan There are no diagnoses linked to this encounter.   Family/ staff Communication:   Labs/tests ordered:

## 2021-01-31 NOTE — Progress Notes (Signed)
This encounter was created in error - please disregard.

## 2021-02-03 DIAGNOSIS — M79671 Pain in right foot: Secondary | ICD-10-CM | POA: Diagnosis not present

## 2021-02-03 DIAGNOSIS — M79672 Pain in left foot: Secondary | ICD-10-CM | POA: Diagnosis not present

## 2021-02-03 DIAGNOSIS — G20 Parkinson's disease: Secondary | ICD-10-CM | POA: Diagnosis not present

## 2021-02-07 DIAGNOSIS — R0602 Shortness of breath: Secondary | ICD-10-CM | POA: Diagnosis not present

## 2021-02-07 DIAGNOSIS — M79672 Pain in left foot: Secondary | ICD-10-CM | POA: Diagnosis not present

## 2021-02-07 DIAGNOSIS — G8929 Other chronic pain: Secondary | ICD-10-CM | POA: Diagnosis not present

## 2021-02-07 DIAGNOSIS — M79671 Pain in right foot: Secondary | ICD-10-CM | POA: Diagnosis not present

## 2021-02-07 DIAGNOSIS — M6281 Muscle weakness (generalized): Secondary | ICD-10-CM | POA: Diagnosis not present

## 2021-02-07 DIAGNOSIS — F323 Major depressive disorder, single episode, severe with psychotic features: Secondary | ICD-10-CM | POA: Diagnosis not present

## 2021-02-07 DIAGNOSIS — G20 Parkinson's disease: Secondary | ICD-10-CM | POA: Diagnosis not present

## 2021-02-07 DIAGNOSIS — F419 Anxiety disorder, unspecified: Secondary | ICD-10-CM | POA: Diagnosis not present

## 2021-02-08 DIAGNOSIS — M6281 Muscle weakness (generalized): Secondary | ICD-10-CM | POA: Diagnosis not present

## 2021-02-08 DIAGNOSIS — M79671 Pain in right foot: Secondary | ICD-10-CM | POA: Diagnosis not present

## 2021-02-08 DIAGNOSIS — R0602 Shortness of breath: Secondary | ICD-10-CM | POA: Diagnosis not present

## 2021-02-08 DIAGNOSIS — F419 Anxiety disorder, unspecified: Secondary | ICD-10-CM | POA: Diagnosis not present

## 2021-02-08 DIAGNOSIS — G20 Parkinson's disease: Secondary | ICD-10-CM | POA: Diagnosis not present

## 2021-02-08 DIAGNOSIS — N200 Calculus of kidney: Secondary | ICD-10-CM | POA: Diagnosis not present

## 2021-02-08 DIAGNOSIS — M79672 Pain in left foot: Secondary | ICD-10-CM | POA: Diagnosis not present

## 2021-02-09 DIAGNOSIS — F060 Psychotic disorder with hallucinations due to known physiological condition: Secondary | ICD-10-CM | POA: Diagnosis not present

## 2021-02-09 DIAGNOSIS — G20 Parkinson's disease: Secondary | ICD-10-CM | POA: Diagnosis not present

## 2021-02-09 DIAGNOSIS — M79671 Pain in right foot: Secondary | ICD-10-CM | POA: Diagnosis not present

## 2021-02-09 DIAGNOSIS — M6281 Muscle weakness (generalized): Secondary | ICD-10-CM | POA: Diagnosis not present

## 2021-02-09 DIAGNOSIS — R0602 Shortness of breath: Secondary | ICD-10-CM | POA: Diagnosis not present

## 2021-02-09 DIAGNOSIS — F419 Anxiety disorder, unspecified: Secondary | ICD-10-CM | POA: Diagnosis not present

## 2021-02-09 DIAGNOSIS — M79672 Pain in left foot: Secondary | ICD-10-CM | POA: Diagnosis not present

## 2021-02-10 DIAGNOSIS — Z79899 Other long term (current) drug therapy: Secondary | ICD-10-CM | POA: Diagnosis not present

## 2021-02-13 DIAGNOSIS — F419 Anxiety disorder, unspecified: Secondary | ICD-10-CM | POA: Diagnosis not present

## 2021-02-13 DIAGNOSIS — R0602 Shortness of breath: Secondary | ICD-10-CM | POA: Diagnosis not present

## 2021-02-13 DIAGNOSIS — M79672 Pain in left foot: Secondary | ICD-10-CM | POA: Diagnosis not present

## 2021-02-13 DIAGNOSIS — G20 Parkinson's disease: Secondary | ICD-10-CM | POA: Diagnosis not present

## 2021-02-13 DIAGNOSIS — M79671 Pain in right foot: Secondary | ICD-10-CM | POA: Diagnosis not present

## 2021-02-13 DIAGNOSIS — M6281 Muscle weakness (generalized): Secondary | ICD-10-CM | POA: Diagnosis not present

## 2021-02-15 DIAGNOSIS — F419 Anxiety disorder, unspecified: Secondary | ICD-10-CM | POA: Diagnosis not present

## 2021-02-15 DIAGNOSIS — M79671 Pain in right foot: Secondary | ICD-10-CM | POA: Diagnosis not present

## 2021-02-15 DIAGNOSIS — M6281 Muscle weakness (generalized): Secondary | ICD-10-CM | POA: Diagnosis not present

## 2021-02-15 DIAGNOSIS — G20 Parkinson's disease: Secondary | ICD-10-CM | POA: Diagnosis not present

## 2021-02-15 DIAGNOSIS — M79672 Pain in left foot: Secondary | ICD-10-CM | POA: Diagnosis not present

## 2021-02-15 DIAGNOSIS — R0602 Shortness of breath: Secondary | ICD-10-CM | POA: Diagnosis not present

## 2021-02-16 DIAGNOSIS — G20 Parkinson's disease: Secondary | ICD-10-CM | POA: Diagnosis not present

## 2021-02-16 DIAGNOSIS — F419 Anxiety disorder, unspecified: Secondary | ICD-10-CM | POA: Diagnosis not present

## 2021-02-16 DIAGNOSIS — R0602 Shortness of breath: Secondary | ICD-10-CM | POA: Diagnosis not present

## 2021-02-16 DIAGNOSIS — M79671 Pain in right foot: Secondary | ICD-10-CM | POA: Diagnosis not present

## 2021-02-16 DIAGNOSIS — M79672 Pain in left foot: Secondary | ICD-10-CM | POA: Diagnosis not present

## 2021-02-16 DIAGNOSIS — M6281 Muscle weakness (generalized): Secondary | ICD-10-CM | POA: Diagnosis not present

## 2021-02-17 ENCOUNTER — Non-Acute Institutional Stay (SKILLED_NURSING_FACILITY): Payer: Medicare Other | Admitting: Nurse Practitioner

## 2021-02-17 ENCOUNTER — Encounter: Payer: Self-pay | Admitting: Nurse Practitioner

## 2021-02-17 DIAGNOSIS — F323 Major depressive disorder, single episode, severe with psychotic features: Secondary | ICD-10-CM | POA: Diagnosis not present

## 2021-02-17 DIAGNOSIS — I5032 Chronic diastolic (congestive) heart failure: Secondary | ICD-10-CM

## 2021-02-17 DIAGNOSIS — K219 Gastro-esophageal reflux disease without esophagitis: Secondary | ICD-10-CM | POA: Diagnosis not present

## 2021-02-17 DIAGNOSIS — I350 Nonrheumatic aortic (valve) stenosis: Secondary | ICD-10-CM

## 2021-02-17 DIAGNOSIS — N200 Calculus of kidney: Secondary | ICD-10-CM

## 2021-02-17 DIAGNOSIS — K5909 Other constipation: Secondary | ICD-10-CM

## 2021-02-17 DIAGNOSIS — E039 Hypothyroidism, unspecified: Secondary | ICD-10-CM | POA: Diagnosis not present

## 2021-02-17 DIAGNOSIS — M154 Erosive (osteo)arthritis: Secondary | ICD-10-CM | POA: Diagnosis not present

## 2021-02-17 DIAGNOSIS — D508 Other iron deficiency anemias: Secondary | ICD-10-CM | POA: Diagnosis not present

## 2021-02-17 DIAGNOSIS — G20 Parkinson's disease: Secondary | ICD-10-CM | POA: Diagnosis not present

## 2021-02-17 NOTE — Progress Notes (Signed)
Location:   [facility] Room Number: 45 Place of Service:  SNF (31) Provider:  Marda Stalker, Lennie Odor NP  [name], MD  Patient Care Team: [name], MD as PCP - General (Internal Medicine) [name], MD as PCP - Cardiology (Cardiology) [name], MD (Neurosurgery) [name], MD (Neurology) [name], MD (Inactive) as Consulting Physician (Gastroenterology) [name], MD as Consulting Physician (Urology) Braedyn Riggle X, NP as Nurse Practitioner (Internal Medicine)  Extended Emergency Contact Information Primary Emergency Contact: [facility] Address: [address]          [place], [place] [address] [name] of [place] Phone: [phone] Mobile Phone: [phone] Relation: Spouse Secondary Emergency Contact: [name] Mobile Phone: [phone] Relation: Daughter Preferred language: [name] Interpreter needed? No  Code Status:  Full Code Goals of care: Advanced Directive information Advanced Directives 02/17/2021  Does Patient Have a Medical Advance Directive? Yes  Type of Advance Directive Out of facility DNR (pink MOST or yellow form);Living will  Does patient want to make changes to medical advance directive? -  Copy of [place] in Chart? -  Would patient like information on creating a medical advance directive? -  Pre-existing out of facility DNR order (yellow form or pink MOST form) Yellow form placed in chart (order not valid for inpatient use)     Chief Complaint  Patient presents with  . Medical Management of Chronic Issues    HPI:  Pt is a 87 y.o. female seen today for medical management of chronic diseases.        Hx of rena lithiasis, recurrent UTI, urosepsis, takes Keflex for suppression therapy, f/u Urology GERD, takesPantoprazole 40mg  qd, Famotidine 20mg  qd. Parkinson's, takes MiraPex 1mg  bid, Sinemet 25/100 II bid.   Depression/hallucination, takes Sertraline 75mg  qd, Quetiapine qd Constipation, takes Senokot S I qd, Amitiza 36mcgbid, Colace qd Hypothyroidism, takes Levothyroxine 75mcg qd.TSH 1.04 10/04/20 Erosive arthritis, takes Plaquenil 200mg  qd, Tylenol 500mg  bid, f/u Rheumatology. CHF/trace edema BLE, takes Furosemide 20mg  qd, cardiology, severe aortic valve stenosis.Bun/creat 17/1.01 11/22/21 Echocardiogram 11/15/20 EF 60-65% Anemia, takes Fe, Hgb 9.6 10/04/20 AS Cardiology palliative approach                Past Medical History:  Diagnosis Date  . Abnormal nuclear stress test   . Actinic cheilitis 04/23/2019  . Anemia    iron deficiency   . Anxiety   . Aortic stenosis    severe by echo 2021   . Arthritis   . Broken arm    right   . Chronic diastolic CHF (congestive heart failure) ([place])   . Chronic pain   . Cognitive communication deficit   . Constipation   . Depression   . Erosive (osteo)arthritis 03/09/2020  . Family history of adverse reaction to anesthesia    pt. states sister vomits  . Fibromyalgia   . GERD (gastroesophageal reflux disease)   . H/O hiatal hernia   . Hallucinations, visual 07/21/2018   03/10/20 psych consult.   . Hematuria 05/18/2020  . History of bronchitis   . History of kidney stones   . Hypercholesterolemia   . Hypertension    dr t turner  . Hypothyroidism   . Memory loss 04/21/2018   08/08/18 CT head no traumatic findings, atrophy.  09/27/19 MMSE 25/30, failed the clock drawing  . Microhematuria 03/24/2015  . Neuropathy   . Osteoporosis   . Parkinson disease ([place])   . PVD (peripheral  vascular disease) ([place])    99% stenosis of left anteiror tibial artery, mod stenosis of left distal SFA and popliteal artery followed by [name]  . RBBB    noted on EKG 2018  . Restless leg syndrome   . Sepsis ([place])    hx of due to [facility]  . Septic shock ([place]) 04/19/2020  . Shortness  of breath    with exertion - chronic   . Sjogren's disease ([place])   . Sjogren's syndrome [facility]) 02/04/2018   04/16/19 rheumatology: Erosive OA of hands, Sjogrens syndrome, low back pain at multiple sites, recurrent kidney stones, age related osteoporosis w/o current pathological fracture, f/u 6 months.   . SOB (shortness of breath)    chronic due to diastolic dysfunction, deconditioning, obesity  . Spinal stenosis    lumbar region   . Tremor   . Unstable gait 04/21/2018  . Unsteadiness on feet   . Urinary frequency 09/18/2018  . Urinary tract infection   . Visual hallucinations    Past Surgical History:  Procedure Laterality Date  . ABDOMINAL HYSTERECTOMY    . BACK SURGERY     4 back surgeries,   lumbar fusion  . CARDIAC CATHETERIZATION  2006   normal  . CYSTOSCOPY/URETEROSCOPY/HOLMIUM LASER/STENT PLACEMENT Left 01/06/2019   Procedure: CYSTOSCOPY/RETROGRADE/URETEROSCOPY/HOLMIUM LASER/BASKET RETRIEVAL/STENT PLACEMENT;  Surgeon: Ceasar Mons, MD;  Location: [facility];  Service: Urology;  Laterality: Left;  . CYSTOSCOPY/URETEROSCOPY/HOLMIUM LASER/STENT PLACEMENT Bilateral 05/24/2020   Procedure: CYSTOSCOPY/RETROGRADE/URETEROSCOPY/HOLMIUM LASER/STENT PLACEMENT;  Surgeon: Ceasar Mons, MD;  Location: [facility];  Service: Urology;  Laterality: Bilateral;  . EYE SURGERY Bilateral    cateracts  . falls     variious fall, broken wrist,and toes  . FRACTURE SURGERY Right April 2016   Wrist, Pt. fell  . IR NEPHROSTOMY PLACEMENT RIGHT  04/19/2020  . kidney stone removal Left 01/20/2019  . RIGHT/LEFT HEART CATH AND CORONARY ANGIOGRAPHY N/A 02/05/2018   Procedure: RIGHT/LEFT HEART CATH AND CORONARY ANGIOGRAPHY;  Surgeon: Troy Sine, MD;  Location: [place] CV LAB;  Service: Cardiovascular;  Laterality: N/A;  . SPINAL CORD STIMULATOR INSERTION N/A 09/09/2015   Procedure: LUMBAR SPINAL CORD STIMULATOR INSERTION;  Surgeon: Clydell Hakim, MD;  Location: [facility];  Service:  Neurosurgery;  Laterality: N/A;  LUMBAR SPINAL CORD STIMULATOR INSERTION  . SPINE SURGERY  April 2013   Back X's 4  . TOTAL HIP ARTHROPLASTY     right  . WRIST FRACTURE SURGERY Bilateral     Allergies  Allergen Reactions  . Adrenalone   . Lactose Other (See Comments)    abd pain, lactose intolerant  . Morphine Other (See Comments)  . Sulfa Antibiotics Other (See Comments)    Headache, very sick  . Codeine Other (See Comments)    Reaction:  Headaches and nightmares   . Lactose Intolerance (Gi) Nausea And Vomiting  . Latex Rash  . Lyrica [Pregabalin] Swelling and Other (See Comments)    Reaction:  Leg swelling  . Other Other (See Comments)    Pt states that pain medications give her nightmares.    . Plaquenil [Hydroxychloroquine Sulfate] Other (See Comments)    Reaction:  GI upset   . Reglan [Metoclopramide] Other (See Comments)    Reaction:  GI upset   . Requip [Ropinirole Hcl] Other (See Comments)    Reaction:  GI upset   . Septra [Sulfamethoxazole-Trimethoprim] Nausea And Vomiting  . Shellfish Allergy Nausea And Vomiting    Allergies as of 02/17/2021  Reactions   Adrenalone    Lactose Other (See Comments)   abd pain, lactose intolerant   Morphine Other (See Comments)   Sulfa Antibiotics Other (See Comments)   Headache, very sick   Codeine Other (See Comments)   Reaction:  Headaches and nightmares    Lactose Intolerance (gi) Nausea And Vomiting   Latex Rash   Lyrica [pregabalin] Swelling, Other (See Comments)   Reaction:  Leg swelling   Other Other (See Comments)   Pt states that pain medications give her nightmares.     Plaquenil [hydroxychloroquine Sulfate] Other (See Comments)   Reaction:  GI upset    Reglan [metoclopramide] Other (See Comments)   Reaction:  GI upset    Requip [ropinirole Hcl] Other (See Comments)   Reaction:  GI upset    Septra [sulfamethoxazole-trimethoprim] Nausea And Vomiting   Shellfish Allergy Nausea And Vomiting      Medication  List       Accurate as of February 17, 2021 11:59 PM. If you have any questions, ask your nurse or doctor.        acetaminophen 500 MG tablet Commonly known as: TYLENOL Take 500 mg by mouth daily as needed for mild pain. not to exceed 3 gm in 24 hours. As needed for arthritis pain   acetaminophen 500 MG tablet Commonly known as: TYLENOL Take 500 mg by mouth 2 (two) times daily.   AeroChamber MV inhaler by Other route. Use as instructed as needed   albuterol 108 (90 Base) MCG/ACT inhaler Commonly known as: VENTOLIN HFA Inhale 2 puffs into the lungs every 6 (six) hours as needed for wheezing or shortness of breath.   aspirin EC 81 MG tablet Take 81 mg by mouth daily.   Calcium 600/Vitamin D3 600-800 MG-UNIT Tabs Generic drug: Calcium Carb-Cholecalciferol Take 1 tablet by mouth daily.   carbidopa-levodopa 25-100 MG tablet Commonly known as: SINEMET IR Take 2 tablets by mouth in the morning and at bedtime.   cephALEXin 250 MG capsule Commonly known as: KEFLEX Take 250 mg by mouth daily.   docusate sodium 100 MG capsule Commonly known as: COLACE Take 100 mg by mouth at bedtime.   eucerin cream Apply 1 application topically daily.   famotidine 20 MG tablet Commonly known as: PEPCID Take 20 mg by mouth daily.   furosemide 20 MG tablet Commonly known as: LASIX Take 20 mg by mouth daily as needed.   furosemide 20 MG tablet Commonly known as: LASIX Take 20 mg by mouth daily.   hydroxychloroquine 200 MG tablet Commonly known as: PLAQUENIL Take 200 mg by mouth daily.   lactose free nutrition Liqd Take 237 mLs by mouth as needed.   levothyroxine 50 MCG tablet Commonly known as: SYNTHROID Take 50 mcg by mouth daily before breakfast.   lubiprostone 24 MCG capsule Commonly known as: AMITIZA Take 24 mcg by mouth 2 (two) times daily with a meal.   MULTIPLE MINERALS-VITAMINS PO Take 1 tablet by mouth daily.   nystatin powder Commonly known as:  MYCOSTATIN/NYSTOP Apply topically 2 (two) times daily as needed.   pantoprazole 40 MG tablet Commonly known as: PROTONIX Take 40 mg by mouth daily.   potassium citrate 10 MEQ (1080 MG) SR tablet Commonly known as: UROCIT-K Take 10 mEq by mouth 2 (two) times daily.   pramipexole 1 MG tablet Commonly known as: MIRAPEX Take 1 mg by mouth 2 (two) times daily.   PRESERVISION AREDS 2 PO Take 1 tablet by mouth daily.  QUEtiapine 25 MG tablet Commonly known as: SEROQUEL Take 25 mg by mouth daily.   rosuvastatin 20 MG tablet Commonly known as: CRESTOR Take 10 mg by mouth daily.   saccharomyces boulardii 250 MG capsule Commonly known as: FLORASTOR Take 250 mg by mouth daily.   senna-docusate 8.6-50 MG tablet Commonly known as: Senokot-S Take 1 tablet by mouth at bedtime.   sertraline 50 MG tablet Commonly known as: ZOLOFT Take 75 mg by mouth at bedtime. 1-1/2 tablets to = 75 mg   Slow Fe 142 (45 Fe) MG Tbcr Generic drug: Ferrous Sulfate Take 1 tablet by mouth. Daily   Systane 0.4-0.3 % Soln Generic drug: Polyethyl Glycol-Propyl Glycol Apply 1 drop to eye 2 (two) times daily as needed (dry eyes).   Vitamin D 50 MCG (2000 UT) tablet Take 2,000 Units by mouth daily.       Review of Systems  Constitutional: Positive for unexpected weight change. Negative for fatigue and fever.       About 2-3Ibs weight gained in the past 2-3 weeks.   HENT: Positive for hearing loss. Negative for congestion and voice change.   Eyes: Negative for visual disturbance.  Respiratory: Positive for shortness of breath. Negative for cough.        Chronic DOE  Cardiovascular: Positive for leg swelling.  Gastrointestinal: Negative for abdominal pain and constipation.  Genitourinary: Negative for dysuria, hematuria and urgency.  Musculoskeletal: Positive for arthralgias, back pain, gait problem and myalgias.       Chronic lower back pain. The R 3rd finger mid knuckle pain is chronic  Skin:  Negative for color change.  Neurological: Positive for tremors. Negative for speech difficulty, weakness and headaches.       Memory lapses. Minimal resting tremor in fingers, not disabling.   Psychiatric/Behavioral: Positive for hallucinations. Negative for behavioral problems and sleep disturbance. The patient is not nervous/anxious.        On and off visual hallucination    Immunization History  Administered Date(s) Administered  . Hepatitis A, Adult 03/01/1997, 09/20/1997  . Influenza Split 09/09/2013, 09/27/2014  . Influenza, High Dose Seasonal PF 08/01/2011, 08/27/2012, 10/14/2013, 09/08/2015, 09/10/2016, 09/12/2017, 09/12/2018, 09/15/2019  . Influenza,inj,Quad PF,6+ Mos 09/11/2016  . Influenza-Unspecified 09/21/2020  . Moderna Sars-Covid-2 Vaccination 12/12/2019, 01/09/2020, 10/18/2020  . Pneumococcal Conjugate-13 04/15/2014  . Pneumococcal Polysaccharide-23 09/28/2005  . Td 06/20/2016  . Tdap 04/29/2006, 09/01/2015  . Zoster 04/29/2006  . Zoster Recombinat (Shingrix) 06/11/2018, 09/10/2018   Pertinent  Health Maintenance Due  Topic Date Due  . INFLUENZA VACCINE  Completed  . DEXA SCAN  Completed  . PNA vac Low Risk Adult  Completed   Fall Risk  11/16/2019 04/23/2019 04/09/2019 10/29/2018 10/27/2018  Falls in the past year? 0 0 0 0 1  Comment - - - fell June 2019 -  Number falls in past yr: - 0 0 - 0  Injury with Fall? - 0 0 - 1  Comment - - - - -  Risk for fall due to : - - - - -  Risk for fall due to: Comment - - - - -   Functional Status Survey:    Vitals:   02/17/21 0932  BP: 120/70  Pulse: 78  Resp: 18  Temp: 98.2 F (36.8 C)  SpO2: 92%  Weight: 140 lb 11.2 oz (63.8 kg)  Height: 5\' 2"  (1.575 m)   Body mass index is 25.73 kg/m. Physical Exam Vitals and nursing note reviewed.  Constitutional:  Appearance: Normal appearance.  HENT:     Head: Normocephalic and atraumatic.     Mouth/Throat:     Mouth: Mucous membranes are moist.  Eyes:      Extraocular Movements: Extraocular movements intact.     Conjunctiva/sclera: Conjunctivae normal.     Pupils: Pupils are equal, round, and reactive to light.  Cardiovascular:     Rate and Rhythm: Normal rate and regular rhythm.     Heart sounds: Murmur heard.    Pulmonary:     Breath sounds: No rhonchi or rales.  Abdominal:     General: Bowel sounds are normal.     Palpations: Abdomen is soft.     Tenderness: There is no abdominal tenderness.  Musculoskeletal:        General: Tenderness present.     Cervical back: Normal range of motion and neck supple.     Right lower leg: Edema present.     Left lower leg: Edema present.     Comments: Trace edema BLE.  Arthritic changes in fingers, R>L. The R 3rd finger mid knuckle swelling, no heat or redness, no deformity.   Skin:    General: Skin is warm and dry.     Comments: A marble sized hematoma occiput, abrasion mid thoracic spine.   Neurological:     General: No focal deficit present.     Mental Status: She is alert. Mental status is at baseline.     Gait: Gait abnormal.     Comments: Oriented to person, place. Ambulates with walker.   Psychiatric:        Mood and Affect: Mood normal.        Behavior: Behavior normal.     Labs reviewed: Recent Labs    04/21/20 0325 04/22/20 0605 04/22/20 0903 04/23/20 0350 04/24/20 0423 04/25/20 0427 05/24/20 1015 08/04/20 0000 09/08/20 0000 10/05/20 0000 11/22/20 1109  NA 143  --    < > 144   < > 146* 141   < > 141 142 141  K 3.4*  --    < > 3.1*   < > 3.8 3.5   < > 4.1 4.2 4.2  CL 115*  --    < > 119*   < > 115* 107   < > 105 107 104  CO2 20*  --    < > 21*   < > 26 24   < > 26* 25* 22  GLUCOSE 129*  --    < > 80   < > 88 100*  --   --   --  104*  BUN 35*  --    < > 33*   < > 16 12   < > 15 17 17   CREATININE 1.23*  --    < > 1.01*   < > 0.81 0.81   < > 0.9 0.9 1.01*  CALCIUM 7.2*  --    < > 7.2*   < > 7.6* 8.9   < > 10.0 9.6 10.0  MG 3.0* 2.6*  --  2.5*  --   --   --   --   --    --   --   PHOS 3.2 2.6  --  2.3*  --   --   --   --   --   --   --    < > = values in this interval not displayed.   Recent Labs    04/20/20 0455 04/21/20 0325 04/24/20  0423 10/05/20 0000  AST 117* 50* 32 19  ALT 18 8 7 15   ALKPHOS 95 95 77 62  BILITOT 0.5 0.5 0.6  --   PROT 5.1* 5.4* 4.7*  --   ALBUMIN 2.3* 2.3* 2.3* 3.5   Recent Labs    04/24/20 0423 04/25/20 0427 04/26/20 0000 05/24/20 1015 06/21/20 0000 06/30/20 0000 08/04/20 0000 10/05/20 0000  WBC 14.5* 13.8*   < > 7.8   < > 9.1 6.2 4.8  NEUTROABS  --   --    < >  --    < > 6,406 3,677 2,328.00  HGB 8.8* 9.4*   < > 9.8*   < > 8.7* 8.9* 9.6*  HCT 27.1* 29.7*   < > 30.9*   < > 27* 28* 30*  MCV 97.5 96.7  --  98.4  --   --   --   --   PLT 194 226   < > 286   < > 443* 354 288   < > = values in this interval not displayed.   Lab Results  Component Value Date   TSH 1.04 10/05/2020   No results found for: HGBA1C Lab Results  Component Value Date   CHOL 105 10/05/2020   HDL 46 10/05/2020   LDLCALC 38 10/05/2020   TRIG 119 10/05/2020   CHOLHDL 2.4 07/14/2019    Significant Diagnostic Results in last 30 days:  No results found.  Assessment/Plan Hypothyroidism  takes Levothyroxine 52mcg qd.TSH 1.04 10/04/20   Erosive (osteo)arthritis takes Plaquenil 200mg  qd, Tylenol 500mg  bid, f/u Rheumatology.  Chronic diastolic CHF (congestive heart failure) (HCC)  takes Furosemide 20mg  qd, cardiology, severe aortic valve stenosis.Bun/creat 17/1.01 11/22/21 Echocardiogram 11/15/20 EF 60-65%   Iron deficiency anemia  takes Fe, Hgb 9.6 10/04/20  Aortic stenosis AS Cardiology palliative approach   Chronic constipation  takes Senokot S I qd, Amitiza 36mcgbid, Colace qd  Depression, psychotic (HCC) Depression/hallucination, takes Sertraline 75mg  qd, Quetiapine qd   Parkinson's disease  takes MiraPex 1mg  bid, Sinemet 25/100 II bid.   GERD (gastroesophageal reflux disease)  takesPantoprazole 40mg  qd,  Famotidine 20mg  qd.   Renal lithiasis Hx of rena lithiasis, recurrent UTI, urosepsis, takes Keflex for suppression therapy, f/u Urology    Family/ staff Communication: plan of care reviewed with the patient and charge nurse.   Labs/tests ordered:  none  Time spend 25 minutes.

## 2021-02-21 DIAGNOSIS — R0602 Shortness of breath: Secondary | ICD-10-CM | POA: Diagnosis not present

## 2021-02-21 DIAGNOSIS — G20 Parkinson's disease: Secondary | ICD-10-CM | POA: Diagnosis not present

## 2021-02-21 DIAGNOSIS — M6281 Muscle weakness (generalized): Secondary | ICD-10-CM | POA: Diagnosis not present

## 2021-02-21 DIAGNOSIS — F419 Anxiety disorder, unspecified: Secondary | ICD-10-CM | POA: Diagnosis not present

## 2021-02-21 DIAGNOSIS — M79672 Pain in left foot: Secondary | ICD-10-CM | POA: Diagnosis not present

## 2021-02-21 DIAGNOSIS — M79671 Pain in right foot: Secondary | ICD-10-CM | POA: Diagnosis not present

## 2021-02-22 ENCOUNTER — Encounter: Payer: Self-pay | Admitting: Nurse Practitioner

## 2021-02-22 DIAGNOSIS — M79671 Pain in right foot: Secondary | ICD-10-CM | POA: Diagnosis not present

## 2021-02-22 DIAGNOSIS — R0602 Shortness of breath: Secondary | ICD-10-CM | POA: Diagnosis not present

## 2021-02-22 DIAGNOSIS — M79672 Pain in left foot: Secondary | ICD-10-CM | POA: Diagnosis not present

## 2021-02-22 DIAGNOSIS — M6281 Muscle weakness (generalized): Secondary | ICD-10-CM | POA: Diagnosis not present

## 2021-02-22 DIAGNOSIS — F419 Anxiety disorder, unspecified: Secondary | ICD-10-CM | POA: Diagnosis not present

## 2021-02-22 DIAGNOSIS — G20 Parkinson's disease: Secondary | ICD-10-CM | POA: Diagnosis not present

## 2021-02-22 NOTE — Assessment & Plan Note (Signed)
takes Plaquenil 200mg qd, Tylenol 500mg bid, f/u Rheumatology.  

## 2021-02-22 NOTE — Assessment & Plan Note (Signed)
Hx of rena lithiasis, recurrent UTI, urosepsis, takes Keflex for suppression therapy, f/u Urology

## 2021-02-22 NOTE — Assessment & Plan Note (Signed)
AS Cardiology palliative approach 

## 2021-02-22 NOTE — Assessment & Plan Note (Signed)
takes MiraPex 1mg bid, Sinemet 25/100 II bid. 

## 2021-02-22 NOTE — Assessment & Plan Note (Signed)
takes Furosemide 20mg  qd, cardiology, severe aortic valve stenosis.Bun/creat 17/1.01 11/22/21 Echocardiogram 11/15/20 EF 60-65%

## 2021-02-22 NOTE — Assessment & Plan Note (Signed)
takes Senokot S I qd, Amitiza 24mcg bid, Colace qd 

## 2021-02-22 NOTE — Assessment & Plan Note (Signed)
Depression/hallucination, takes Sertraline 75mg  qd, Quetiapine qd

## 2021-02-22 NOTE — Assessment & Plan Note (Signed)
takes Fe, Hgb 9.6 10/04/20

## 2021-02-22 NOTE — Assessment & Plan Note (Signed)
takes Levothyroxine 68mcg qd.TSH 1.04 10/04/20

## 2021-02-22 NOTE — Assessment & Plan Note (Signed)
takesPantoprazole 40mg  qd, Famotidine 20mg  qd.

## 2021-02-23 DIAGNOSIS — M6281 Muscle weakness (generalized): Secondary | ICD-10-CM | POA: Diagnosis not present

## 2021-02-23 DIAGNOSIS — M79672 Pain in left foot: Secondary | ICD-10-CM | POA: Diagnosis not present

## 2021-02-23 DIAGNOSIS — M79671 Pain in right foot: Secondary | ICD-10-CM | POA: Diagnosis not present

## 2021-02-23 DIAGNOSIS — F419 Anxiety disorder, unspecified: Secondary | ICD-10-CM | POA: Diagnosis not present

## 2021-02-23 DIAGNOSIS — R0602 Shortness of breath: Secondary | ICD-10-CM | POA: Diagnosis not present

## 2021-02-23 DIAGNOSIS — G20 Parkinson's disease: Secondary | ICD-10-CM | POA: Diagnosis not present

## 2021-02-28 DIAGNOSIS — M6281 Muscle weakness (generalized): Secondary | ICD-10-CM | POA: Diagnosis not present

## 2021-02-28 DIAGNOSIS — R0602 Shortness of breath: Secondary | ICD-10-CM | POA: Diagnosis not present

## 2021-02-28 DIAGNOSIS — M79671 Pain in right foot: Secondary | ICD-10-CM | POA: Diagnosis not present

## 2021-02-28 DIAGNOSIS — G20 Parkinson's disease: Secondary | ICD-10-CM | POA: Diagnosis not present

## 2021-02-28 DIAGNOSIS — F419 Anxiety disorder, unspecified: Secondary | ICD-10-CM | POA: Diagnosis not present

## 2021-02-28 DIAGNOSIS — M79672 Pain in left foot: Secondary | ICD-10-CM | POA: Diagnosis not present

## 2021-03-01 DIAGNOSIS — M79672 Pain in left foot: Secondary | ICD-10-CM | POA: Diagnosis not present

## 2021-03-01 DIAGNOSIS — G20 Parkinson's disease: Secondary | ICD-10-CM | POA: Diagnosis not present

## 2021-03-01 DIAGNOSIS — R0602 Shortness of breath: Secondary | ICD-10-CM | POA: Diagnosis not present

## 2021-03-01 DIAGNOSIS — F419 Anxiety disorder, unspecified: Secondary | ICD-10-CM | POA: Diagnosis not present

## 2021-03-01 DIAGNOSIS — M79671 Pain in right foot: Secondary | ICD-10-CM | POA: Diagnosis not present

## 2021-03-01 DIAGNOSIS — M6281 Muscle weakness (generalized): Secondary | ICD-10-CM | POA: Diagnosis not present

## 2021-03-03 DIAGNOSIS — M79672 Pain in left foot: Secondary | ICD-10-CM | POA: Diagnosis not present

## 2021-03-03 DIAGNOSIS — G20 Parkinson's disease: Secondary | ICD-10-CM | POA: Diagnosis not present

## 2021-03-03 DIAGNOSIS — R0602 Shortness of breath: Secondary | ICD-10-CM | POA: Diagnosis not present

## 2021-03-03 DIAGNOSIS — F419 Anxiety disorder, unspecified: Secondary | ICD-10-CM | POA: Diagnosis not present

## 2021-03-03 DIAGNOSIS — M6281 Muscle weakness (generalized): Secondary | ICD-10-CM | POA: Diagnosis not present

## 2021-03-03 DIAGNOSIS — M79671 Pain in right foot: Secondary | ICD-10-CM | POA: Diagnosis not present

## 2021-03-06 DIAGNOSIS — R0602 Shortness of breath: Secondary | ICD-10-CM | POA: Diagnosis not present

## 2021-03-06 DIAGNOSIS — M6281 Muscle weakness (generalized): Secondary | ICD-10-CM | POA: Diagnosis not present

## 2021-03-06 DIAGNOSIS — F419 Anxiety disorder, unspecified: Secondary | ICD-10-CM | POA: Diagnosis not present

## 2021-03-06 DIAGNOSIS — M79672 Pain in left foot: Secondary | ICD-10-CM | POA: Diagnosis not present

## 2021-03-06 DIAGNOSIS — G20 Parkinson's disease: Secondary | ICD-10-CM | POA: Diagnosis not present

## 2021-03-06 DIAGNOSIS — M79671 Pain in right foot: Secondary | ICD-10-CM | POA: Diagnosis not present

## 2021-03-07 DIAGNOSIS — N200 Calculus of kidney: Secondary | ICD-10-CM | POA: Diagnosis not present

## 2021-03-07 DIAGNOSIS — N130 Hydronephrosis with ureteropelvic junction obstruction: Secondary | ICD-10-CM | POA: Diagnosis not present

## 2021-03-08 DIAGNOSIS — M79671 Pain in right foot: Secondary | ICD-10-CM | POA: Diagnosis not present

## 2021-03-08 DIAGNOSIS — M79672 Pain in left foot: Secondary | ICD-10-CM | POA: Diagnosis not present

## 2021-03-08 DIAGNOSIS — F419 Anxiety disorder, unspecified: Secondary | ICD-10-CM | POA: Diagnosis not present

## 2021-03-08 DIAGNOSIS — G20 Parkinson's disease: Secondary | ICD-10-CM | POA: Diagnosis not present

## 2021-03-08 DIAGNOSIS — M6281 Muscle weakness (generalized): Secondary | ICD-10-CM | POA: Diagnosis not present

## 2021-03-08 DIAGNOSIS — R0602 Shortness of breath: Secondary | ICD-10-CM | POA: Diagnosis not present

## 2021-03-10 DIAGNOSIS — R0602 Shortness of breath: Secondary | ICD-10-CM | POA: Diagnosis not present

## 2021-03-10 DIAGNOSIS — M79671 Pain in right foot: Secondary | ICD-10-CM | POA: Diagnosis not present

## 2021-03-10 DIAGNOSIS — M6281 Muscle weakness (generalized): Secondary | ICD-10-CM | POA: Diagnosis not present

## 2021-03-10 DIAGNOSIS — G20 Parkinson's disease: Secondary | ICD-10-CM | POA: Diagnosis not present

## 2021-03-10 DIAGNOSIS — M79672 Pain in left foot: Secondary | ICD-10-CM | POA: Diagnosis not present

## 2021-03-10 DIAGNOSIS — F323 Major depressive disorder, single episode, severe with psychotic features: Secondary | ICD-10-CM | POA: Diagnosis not present

## 2021-03-10 DIAGNOSIS — F419 Anxiety disorder, unspecified: Secondary | ICD-10-CM | POA: Diagnosis not present

## 2021-03-10 DIAGNOSIS — G8929 Other chronic pain: Secondary | ICD-10-CM | POA: Diagnosis not present

## 2021-03-13 DIAGNOSIS — F419 Anxiety disorder, unspecified: Secondary | ICD-10-CM | POA: Diagnosis not present

## 2021-03-13 DIAGNOSIS — M79672 Pain in left foot: Secondary | ICD-10-CM | POA: Diagnosis not present

## 2021-03-13 DIAGNOSIS — R0602 Shortness of breath: Secondary | ICD-10-CM | POA: Diagnosis not present

## 2021-03-13 DIAGNOSIS — G20 Parkinson's disease: Secondary | ICD-10-CM | POA: Diagnosis not present

## 2021-03-13 DIAGNOSIS — M79671 Pain in right foot: Secondary | ICD-10-CM | POA: Diagnosis not present

## 2021-03-13 DIAGNOSIS — M6281 Muscle weakness (generalized): Secondary | ICD-10-CM | POA: Diagnosis not present

## 2021-03-15 DIAGNOSIS — F419 Anxiety disorder, unspecified: Secondary | ICD-10-CM | POA: Diagnosis not present

## 2021-03-15 DIAGNOSIS — M6281 Muscle weakness (generalized): Secondary | ICD-10-CM | POA: Diagnosis not present

## 2021-03-15 DIAGNOSIS — G20 Parkinson's disease: Secondary | ICD-10-CM | POA: Diagnosis not present

## 2021-03-15 DIAGNOSIS — R0602 Shortness of breath: Secondary | ICD-10-CM | POA: Diagnosis not present

## 2021-03-15 DIAGNOSIS — M79672 Pain in left foot: Secondary | ICD-10-CM | POA: Diagnosis not present

## 2021-03-15 DIAGNOSIS — M79671 Pain in right foot: Secondary | ICD-10-CM | POA: Diagnosis not present

## 2021-03-17 DIAGNOSIS — M6281 Muscle weakness (generalized): Secondary | ICD-10-CM | POA: Diagnosis not present

## 2021-03-17 DIAGNOSIS — F419 Anxiety disorder, unspecified: Secondary | ICD-10-CM | POA: Diagnosis not present

## 2021-03-17 DIAGNOSIS — M79671 Pain in right foot: Secondary | ICD-10-CM | POA: Diagnosis not present

## 2021-03-17 DIAGNOSIS — G20 Parkinson's disease: Secondary | ICD-10-CM | POA: Diagnosis not present

## 2021-03-17 DIAGNOSIS — R0602 Shortness of breath: Secondary | ICD-10-CM | POA: Diagnosis not present

## 2021-03-17 DIAGNOSIS — M79672 Pain in left foot: Secondary | ICD-10-CM | POA: Diagnosis not present

## 2021-03-20 DIAGNOSIS — M797 Fibromyalgia: Secondary | ICD-10-CM | POA: Diagnosis not present

## 2021-03-20 DIAGNOSIS — M154 Erosive (osteo)arthritis: Secondary | ICD-10-CM | POA: Diagnosis not present

## 2021-03-20 DIAGNOSIS — M810 Age-related osteoporosis without current pathological fracture: Secondary | ICD-10-CM | POA: Diagnosis not present

## 2021-03-20 DIAGNOSIS — M3500 Sicca syndrome, unspecified: Secondary | ICD-10-CM | POA: Diagnosis not present

## 2021-03-20 DIAGNOSIS — M2550 Pain in unspecified joint: Secondary | ICD-10-CM | POA: Diagnosis not present

## 2021-03-20 IMAGING — DX DG CHEST 1V PORT
1 series · 1 of 1 positions shown · non-contrast
Comparison: Chest radiograph 01/15/2019

CLINICAL DATA: Shortness of breath and fever

EXAM:
PORTABLE CHEST 1 VIEW

[chest ap]
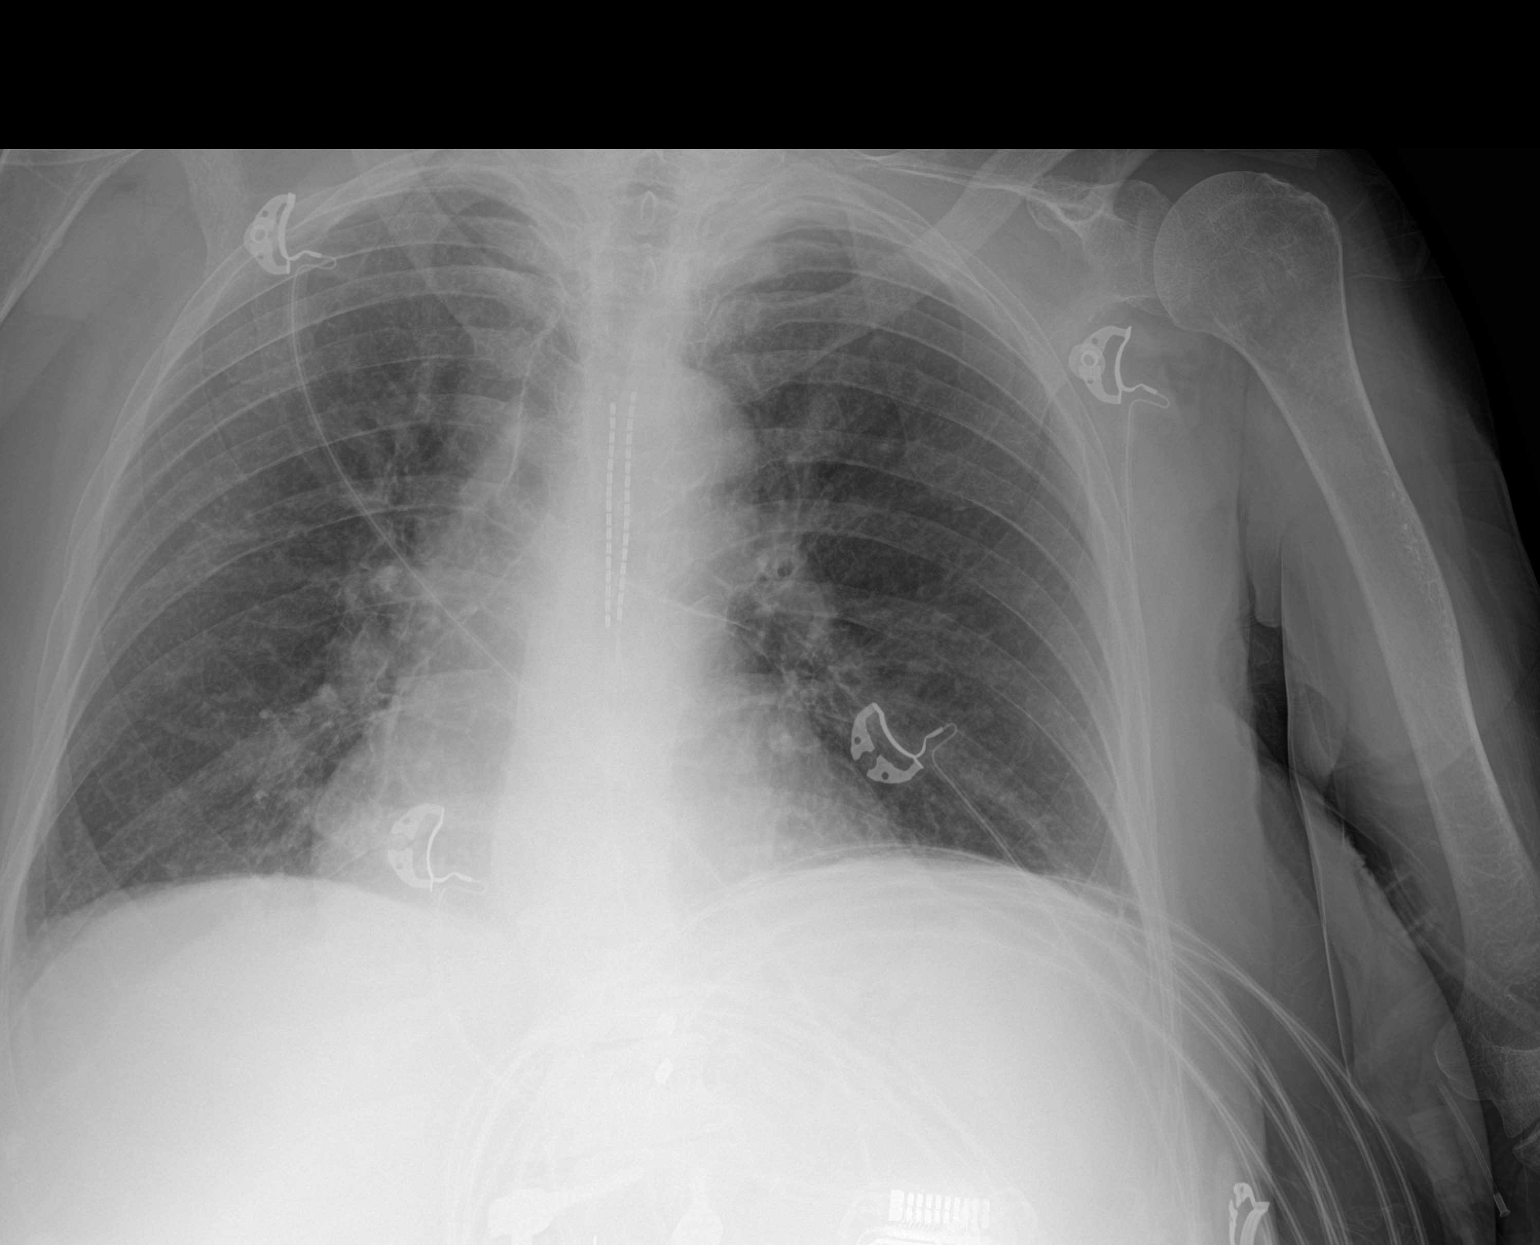

[1 of 1 positions shown; findings below may reference images not displayed]

FINDINGS: The heart size and mediastinal contours are within normal limits.
Both lungs are clear. The visualized skeletal structures are
unremarkable. Spinal stimulator leads overlie the midthoracic spine.
IMPRESSION: No active disease.

## 2021-03-21 DIAGNOSIS — F419 Anxiety disorder, unspecified: Secondary | ICD-10-CM | POA: Diagnosis not present

## 2021-03-21 DIAGNOSIS — M6281 Muscle weakness (generalized): Secondary | ICD-10-CM | POA: Diagnosis not present

## 2021-03-21 DIAGNOSIS — M79672 Pain in left foot: Secondary | ICD-10-CM | POA: Diagnosis not present

## 2021-03-21 DIAGNOSIS — G20 Parkinson's disease: Secondary | ICD-10-CM | POA: Diagnosis not present

## 2021-03-21 DIAGNOSIS — M79671 Pain in right foot: Secondary | ICD-10-CM | POA: Diagnosis not present

## 2021-03-21 DIAGNOSIS — R0602 Shortness of breath: Secondary | ICD-10-CM | POA: Diagnosis not present

## 2021-03-22 DIAGNOSIS — F419 Anxiety disorder, unspecified: Secondary | ICD-10-CM | POA: Diagnosis not present

## 2021-03-22 DIAGNOSIS — M79672 Pain in left foot: Secondary | ICD-10-CM | POA: Diagnosis not present

## 2021-03-22 DIAGNOSIS — M6281 Muscle weakness (generalized): Secondary | ICD-10-CM | POA: Diagnosis not present

## 2021-03-22 DIAGNOSIS — R0602 Shortness of breath: Secondary | ICD-10-CM | POA: Diagnosis not present

## 2021-03-22 DIAGNOSIS — G20 Parkinson's disease: Secondary | ICD-10-CM | POA: Diagnosis not present

## 2021-03-22 DIAGNOSIS — M79671 Pain in right foot: Secondary | ICD-10-CM | POA: Diagnosis not present

## 2021-03-23 DIAGNOSIS — Z85068 Personal history of other malignant neoplasm of small intestine: Secondary | ICD-10-CM | POA: Diagnosis not present

## 2021-03-23 DIAGNOSIS — L988 Other specified disorders of the skin and subcutaneous tissue: Secondary | ICD-10-CM | POA: Diagnosis not present

## 2021-03-23 DIAGNOSIS — L820 Inflamed seborrheic keratosis: Secondary | ICD-10-CM | POA: Diagnosis not present

## 2021-03-24 DIAGNOSIS — M6281 Muscle weakness (generalized): Secondary | ICD-10-CM | POA: Diagnosis not present

## 2021-03-24 DIAGNOSIS — F419 Anxiety disorder, unspecified: Secondary | ICD-10-CM | POA: Diagnosis not present

## 2021-03-24 DIAGNOSIS — R0602 Shortness of breath: Secondary | ICD-10-CM | POA: Diagnosis not present

## 2021-03-24 DIAGNOSIS — M79672 Pain in left foot: Secondary | ICD-10-CM | POA: Diagnosis not present

## 2021-03-24 DIAGNOSIS — M79671 Pain in right foot: Secondary | ICD-10-CM | POA: Diagnosis not present

## 2021-03-24 DIAGNOSIS — G20 Parkinson's disease: Secondary | ICD-10-CM | POA: Diagnosis not present

## 2021-03-27 DIAGNOSIS — G20 Parkinson's disease: Secondary | ICD-10-CM | POA: Diagnosis not present

## 2021-03-27 DIAGNOSIS — M6281 Muscle weakness (generalized): Secondary | ICD-10-CM | POA: Diagnosis not present

## 2021-03-27 DIAGNOSIS — M79672 Pain in left foot: Secondary | ICD-10-CM | POA: Diagnosis not present

## 2021-03-27 DIAGNOSIS — R0602 Shortness of breath: Secondary | ICD-10-CM | POA: Diagnosis not present

## 2021-03-27 DIAGNOSIS — F419 Anxiety disorder, unspecified: Secondary | ICD-10-CM | POA: Diagnosis not present

## 2021-03-27 DIAGNOSIS — M79671 Pain in right foot: Secondary | ICD-10-CM | POA: Diagnosis not present

## 2021-03-29 DIAGNOSIS — M79672 Pain in left foot: Secondary | ICD-10-CM | POA: Diagnosis not present

## 2021-03-29 DIAGNOSIS — F419 Anxiety disorder, unspecified: Secondary | ICD-10-CM | POA: Diagnosis not present

## 2021-03-29 DIAGNOSIS — M6281 Muscle weakness (generalized): Secondary | ICD-10-CM | POA: Diagnosis not present

## 2021-03-29 DIAGNOSIS — M79671 Pain in right foot: Secondary | ICD-10-CM | POA: Diagnosis not present

## 2021-03-29 DIAGNOSIS — R0602 Shortness of breath: Secondary | ICD-10-CM | POA: Diagnosis not present

## 2021-03-29 DIAGNOSIS — G20 Parkinson's disease: Secondary | ICD-10-CM | POA: Diagnosis not present

## 2021-04-07 ENCOUNTER — Non-Acute Institutional Stay (SKILLED_NURSING_FACILITY): Payer: Medicare Other | Admitting: Nurse Practitioner

## 2021-04-07 ENCOUNTER — Encounter: Payer: Self-pay | Admitting: Nurse Practitioner

## 2021-04-07 DIAGNOSIS — K5909 Other constipation: Secondary | ICD-10-CM | POA: Diagnosis not present

## 2021-04-07 DIAGNOSIS — N1831 Chronic kidney disease, stage 3a: Secondary | ICD-10-CM

## 2021-04-07 DIAGNOSIS — I5032 Chronic diastolic (congestive) heart failure: Secondary | ICD-10-CM | POA: Diagnosis not present

## 2021-04-07 DIAGNOSIS — E039 Hypothyroidism, unspecified: Secondary | ICD-10-CM

## 2021-04-07 DIAGNOSIS — M154 Erosive (osteo)arthritis: Secondary | ICD-10-CM

## 2021-04-07 DIAGNOSIS — I350 Nonrheumatic aortic (valve) stenosis: Secondary | ICD-10-CM | POA: Diagnosis not present

## 2021-04-07 DIAGNOSIS — F323 Major depressive disorder, single episode, severe with psychotic features: Secondary | ICD-10-CM | POA: Diagnosis not present

## 2021-04-07 DIAGNOSIS — K219 Gastro-esophageal reflux disease without esophagitis: Secondary | ICD-10-CM

## 2021-04-07 DIAGNOSIS — N200 Calculus of kidney: Secondary | ICD-10-CM | POA: Diagnosis not present

## 2021-04-07 DIAGNOSIS — D508 Other iron deficiency anemias: Secondary | ICD-10-CM | POA: Diagnosis not present

## 2021-04-07 DIAGNOSIS — G20 Parkinson's disease: Secondary | ICD-10-CM | POA: Diagnosis not present

## 2021-04-07 NOTE — Assessment & Plan Note (Signed)
CHF/trace edema BLE, takes Furosemide 72m qd, cardiology, severe aortic valve stenosis.Bun/creat 17/1.01. repeat CMP/eGFR

## 2021-04-07 NOTE — Assessment & Plan Note (Signed)
AS Cardiology palliative approach 

## 2021-04-07 NOTE — Assessment & Plan Note (Signed)
Depression/hallucination, improved but not resolved, takes Sertraline 75mg  qd, Quetiapine qd

## 2021-04-07 NOTE — Assessment & Plan Note (Signed)
Hypothyroidism, takes Levothyroxine 42mcg qd.TSH 1.04 10/04/20, update TSH

## 2021-04-07 NOTE — Assessment & Plan Note (Signed)
Stable,  takesPantoprazole 40mg  qd, Famotidine 20mg  qd

## 2021-04-07 NOTE — Assessment & Plan Note (Signed)
Erosive arthritis, takes Plaquenil 200mg qd, Tylenol 500mg bid, f/u Rheumatology.  

## 2021-04-07 NOTE — Assessment & Plan Note (Signed)
Anemia, takes Fe, Hgb 9.6 10/04/20. Update CBC/diff

## 2021-04-07 NOTE — Assessment & Plan Note (Signed)
Stable,  takes Senokot S I qd, Amitiza 43mcgbid, Colace qd

## 2021-04-07 NOTE — Assessment & Plan Note (Signed)
Parkinson's, takes MiraPex 1mg bid, Sinemet 25/100 II bid.  

## 2021-04-07 NOTE — Progress Notes (Signed)
Location:   SNF [place] Room Number: 45 Place of Service:  SNF (31) Provider: [facility] Demichael Traum NP  [name], MD  Patient Care Team: [name], MD as PCP - General (Internal Medicine) [name], MD as PCP - Cardiology (Cardiology) [name], MD (Neurosurgery) [name], MD (Neurology) [name], MD (Inactive) as Consulting Physician (Gastroenterology) [name], MD as Consulting Physician (Urology) Fantasia Jinkins X, NP as Nurse Practitioner (Internal Medicine)  Extended Emergency Contact Information Primary Emergency Contact: [facility] Address: [address]          [place], [place] [address] [name] of [place] Phone: [phone] Mobile Phone: [phone] Relation: Spouse Secondary Emergency Contact: [name] Mobile Phone: [phone] Relation: Daughter Preferred language: [name] Interpreter needed? No  Code Status:  DNR Goals of care: Advanced Directive information Advanced Directives 02/17/2021  Does Patient Have a Medical Advance Directive? Yes  Type of Advance Directive Out of facility DNR (pink MOST or yellow form);Living will  Does patient want to make changes to medical advance directive? -  Copy of [place] in Chart? -  Would patient like information on creating a medical advance directive? -  Pre-existing out of facility DNR order (yellow form or pink MOST form) Yellow form placed in chart (order not valid for inpatient use)     Chief Complaint  Patient presents with  . Medical Management of Chronic Issues    HPI:  Pt is a 87 y.o. female seen today for medical management of chronic diseases.      Hx of rena lithiasis, recurrent UTI, urosepsis, takes Keflex for suppression therapy, f/u Urology GERD, takesPantoprazole 30m qd, Famotidine 244mqd. Parkinson's, takes MiraPex 88m35mid, Sinemet 25/100 II bid.   Depression/hallucination, takes Sertraline 35m32m, Quetiapine qd Constipation, takes Senokot S I qd, Amitiza 24mc35m, Colace qd Hypothyroidism, takes Levothyroxine 50mcg25mTSH 1.04 10/04/20 Erosive arthritis, takes Plaquenil 200mg q63mylenol 500mg bi66m/u Rheumatology. CHF/trace edema BLE, takes Furosemide 20mg qd,62mdiology, severe aortic valve stenosis.Bun/creat 17/1.01 11/22/21 Echocardiogram 11/15/20 EF 60-65% Anemia, takes Fe, Hgb 9.6 10/04/20 AS Cardiology palliative approach  Past Medical History:  Diagnosis Date  . Abnormal nuclear stress test   . Actinic cheilitis 04/23/2019  . Anemia    iron deficiency   . Anxiety   . Aortic stenosis    severe by echo 2021   . Arthritis   . Broken arm    right   . Chronic diastolic CHF (congestive heart failure) (HCC)   . [place] pain   . Cognitive communication deficit   . Constipation   . Depression   . Erosive (osteo)arthritis 03/09/2020  . Family history of adverse reaction to anesthesia    pt. states sister vomits  . Fibromyalgia   . GERD (gastroesophageal reflux disease)   . H/O hiatal hernia   . Hallucinations, visual 07/21/2018   03/10/20 psych consult.   . Hematuria 05/18/2020  . History of bronchitis   . History of kidney stones   . Hypercholesterolemia   . Hypertension    dr t turner  . Hypothyroidism   . Memory loss 04/21/2018   08/08/18 CT head no traumatic findings, atrophy.  09/27/19 MMSE 25/30, failed the clock drawing  . Microhematuria 03/24/2015  . Neuropathy   . Osteoporosis   . Parkinson disease (HCC)   . [place] (peripheral vascular disease) (HCC)    99% stenosis of left anteiror tibial artery, mod stenosis of left distal SFA  and popliteal artery followed by [name]  . RBBB    noted on EKG 2018  . Restless leg syndrome   . Sepsis ([place])    hx of due to [facility]  . Septic shock ([place]) 04/19/2020  . Shortness of breath     with exertion - chronic   . Sjogren's disease ([place])   . Sjogren's syndrome [facility]) 02/04/2018   04/16/19 rheumatology: Erosive OA of hands, Sjogrens syndrome, low back pain at multiple sites, recurrent kidney stones, age related osteoporosis w/o current pathological fracture, f/u 6 months.   . SOB (shortness of breath)    chronic due to diastolic dysfunction, deconditioning, obesity  . Spinal stenosis    lumbar region   . Tremor   . Unstable gait 04/21/2018  . Unsteadiness on feet   . Urinary frequency 09/18/2018  . Urinary tract infection   . Visual hallucinations    Past Surgical History:  Procedure Laterality Date  . ABDOMINAL HYSTERECTOMY    . BACK SURGERY     4 back surgeries,   lumbar fusion  . CARDIAC CATHETERIZATION  2006   normal  . CYSTOSCOPY/URETEROSCOPY/HOLMIUM LASER/STENT PLACEMENT Left 01/06/2019   Procedure: CYSTOSCOPY/RETROGRADE/URETEROSCOPY/HOLMIUM LASER/BASKET RETRIEVAL/STENT PLACEMENT;  Surgeon: Ceasar Mons, MD;  Location: [facility]S;  Service: Urology;  Laterality: Left;  . CYSTOSCOPY/URETEROSCOPY/HOLMIUM LASER/STENT PLACEMENT Bilateral 05/24/2020   Procedure: CYSTOSCOPY/RETROGRADE/URETEROSCOPY/HOLMIUM LASER/STENT PLACEMENT;  Surgeon: Ceasar Mons, MD;  Location: [facility] ORS;  Service: Urology;  Laterality: Bilateral;  . EYE SURGERY Bilateral    cateracts  . falls     variious fall, broken wrist,and toes  . FRACTURE SURGERY Right April 2016   Wrist, Pt. fell  . IR NEPHROSTOMY PLACEMENT RIGHT  04/19/2020  . kidney stone removal Left 01/20/2019  . RIGHT/LEFT HEART CATH AND CORONARY ANGIOGRAPHY N/A 02/05/2018   Procedure: RIGHT/LEFT HEART CATH AND CORONARY ANGIOGRAPHY;  Surgeon: Troy Sine, MD;  Location: [place] CV LAB;  Service: Cardiovascular;  Laterality: N/A;  . SPINAL CORD STIMULATOR INSERTION N/A 09/09/2015   Procedure: LUMBAR SPINAL CORD STIMULATOR INSERTION;  Surgeon: Clydell Hakim, MD;  Location: [place] NEURO ORS;  Service: Neurosurgery;   Laterality: N/A;  LUMBAR SPINAL CORD STIMULATOR INSERTION  . SPINE SURGERY  April 2013   Back X's 4  . TOTAL HIP ARTHROPLASTY     right  . WRIST FRACTURE SURGERY Bilateral     Allergies  Allergen Reactions  . Adrenalone   . Lactose Other (See Comments)    abd pain, lactose intolerant  . Morphine Other (See Comments)  . Sulfa Antibiotics Other (See Comments)    Headache, very sick  . Codeine Other (See Comments)    Reaction:  Headaches and nightmares   . Lactose Intolerance (Gi) Nausea And Vomiting  . Latex Rash  . Lyrica [Pregabalin] Swelling and Other (See Comments)    Reaction:  Leg swelling  . Other Other (See Comments)    Pt states that pain medications give her nightmares.    . Plaquenil [Hydroxychloroquine Sulfate] Other (See Comments)    Reaction:  GI upset   . Reglan [Metoclopramide] Other (See Comments)    Reaction:  GI upset   . Requip [Ropinirole Hcl] Other (See Comments)    Reaction:  GI upset   . Septra [Sulfamethoxazole-Trimethoprim] Nausea And Vomiting  . Shellfish Allergy Nausea And Vomiting    Allergies as of 04/07/2021      Reactions   Adrenalone    Lactose Other (See Comments)   abd pain, lactose intolerant  Morphine Other (See Comments)   Sulfa Antibiotics Other (See Comments)   Headache, very sick   Codeine Other (See Comments)   Reaction:  Headaches and nightmares    Lactose Intolerance (gi) Nausea And Vomiting   Latex Rash   Lyrica [pregabalin] Swelling, Other (See Comments)   Reaction:  Leg swelling   Other Other (See Comments)   Pt states that pain medications give her nightmares.     Plaquenil [hydroxychloroquine Sulfate] Other (See Comments)   Reaction:  GI upset    Reglan [metoclopramide] Other (See Comments)   Reaction:  GI upset    Requip [ropinirole Hcl] Other (See Comments)   Reaction:  GI upset    Septra [sulfamethoxazole-trimethoprim] Nausea And Vomiting   Shellfish Allergy Nausea And Vomiting      Medication List        Accurate as of April 07, 2021 11:59 PM. If you have any questions, ask your nurse or doctor.        acetaminophen 500 MG tablet Commonly known as: TYLENOL Take 500 mg by mouth daily as needed for mild pain. not to exceed 3 gm in 24 hours. As needed for arthritis pain   acetaminophen 500 MG tablet Commonly known as: TYLENOL Take 500 mg by mouth 2 (two) times daily.   AeroChamber MV inhaler by Other route. Use as instructed as needed   albuterol 108 (90 Base) MCG/ACT inhaler Commonly known as: VENTOLIN HFA Inhale 2 puffs into the lungs every 6 (six) hours as needed for wheezing or shortness of breath.   aspirin EC 81 MG tablet Take 81 mg by mouth daily.   Calcium 600/Vitamin D3 600-800 MG-UNIT Tabs Generic drug: Calcium Carb-Cholecalciferol Take 1 tablet by mouth daily.   carbidopa-levodopa 25-100 MG tablet Commonly known as: SINEMET IR Take 2 tablets by mouth in the morning and at bedtime.   cephALEXin 250 MG capsule Commonly known as: KEFLEX Take 250 mg by mouth daily.   docusate sodium 100 MG capsule Commonly known as: COLACE Take 100 mg by mouth at bedtime.   eucerin cream Apply 1 application topically daily.   famotidine 20 MG tablet Commonly known as: PEPCID Take 20 mg by mouth daily.   furosemide 20 MG tablet Commonly known as: LASIX Take 20 mg by mouth daily as needed.   furosemide 20 MG tablet Commonly known as: LASIX Take 20 mg by mouth daily.   hydroxychloroquine 200 MG tablet Commonly known as: PLAQUENIL Take 200 mg by mouth daily.   lactose free nutrition Liqd Take 237 mLs by mouth as needed.   levothyroxine 50 MCG tablet Commonly known as: SYNTHROID Take 50 mcg by mouth daily before breakfast.   lubiprostone 24 MCG capsule Commonly known as: AMITIZA Take 24 mcg by mouth 2 (two) times daily with a meal.   MULTIPLE MINERALS-VITAMINS PO Take 1 tablet by mouth daily.   nystatin powder Commonly known as: MYCOSTATIN/NYSTOP Apply topically  2 (two) times daily as needed.   pantoprazole 40 MG tablet Commonly known as: PROTONIX Take 40 mg by mouth daily.   potassium citrate 10 MEQ (1080 MG) SR tablet Commonly known as: UROCIT-K Take 10 mEq by mouth 2 (two) times daily.   pramipexole 1 MG tablet Commonly known as: MIRAPEX Take 1 mg by mouth 2 (two) times daily.   PRESERVISION AREDS 2 PO Take 1 tablet by mouth daily.   QUEtiapine 25 MG tablet Commonly known as: SEROQUEL Take 25 mg by mouth daily.   rosuvastatin 20 MG  tablet Commonly known as: CRESTOR Take 10 mg by mouth daily.   saccharomyces boulardii 250 MG capsule Commonly known as: FLORASTOR Take 250 mg by mouth daily.   senna-docusate 8.6-50 MG tablet Commonly known as: Senokot-S Take 1 tablet by mouth at bedtime.   sertraline 50 MG tablet Commonly known as: ZOLOFT Take 75 mg by mouth at bedtime. 1-1/2 tablets to = 75 mg   Slow Fe 142 (45 Fe) MG Tbcr Generic drug: Ferrous Sulfate Take 1 tablet by mouth. Daily   Systane 0.4-0.3 % Soln Generic drug: Polyethyl Glycol-Propyl Glycol Apply 1 drop to eye 2 (two) times daily as needed (dry eyes).   Vitamin D 50 MCG (2000 UT) tablet Take 2,000 Units by mouth daily.       Review of Systems  Constitutional: Negative for fatigue, fever and unexpected weight change.  HENT: Positive for hearing loss. Negative for congestion and voice change.   Eyes: Negative for visual disturbance.  Respiratory: Positive for shortness of breath. Negative for cough.        Chronic DOE  Cardiovascular: Positive for leg swelling.  Gastrointestinal: Negative for abdominal pain and constipation.  Genitourinary: Negative for dysuria, hematuria and urgency.  Musculoskeletal: Positive for arthralgias, back pain, gait problem and myalgias.       Chronic lower back pain. The R 3rd finger mid knuckle pain is chronic  Skin: Negative for color change.  Neurological: Positive for tremors. Negative for speech difficulty, weakness and  headaches.       Memory lapses. Minimal resting tremor in fingers, not disabling.   Psychiatric/Behavioral: Positive for hallucinations. Negative for behavioral problems and sleep disturbance. The patient is not nervous/anxious.        On and off visual hallucination    Immunization History  Administered Date(s) Administered  . Hepatitis A, Adult 03/01/1997, 09/20/1997  . Influenza Split 09/09/2013, 09/27/2014  . Influenza, High Dose Seasonal PF 08/01/2011, 08/27/2012, 10/14/2013, 09/08/2015, 09/10/2016, 09/12/2017, 09/12/2018, 09/15/2019  . Influenza,inj,Quad PF,6+ Mos 09/11/2016  . Influenza-Unspecified 09/21/2020  . Moderna Sars-Covid-2 Vaccination 12/12/2019, 01/09/2020, 10/18/2020  . Pneumococcal Conjugate-13 04/15/2014  . Pneumococcal Polysaccharide-23 09/28/2005  . Td 06/20/2016  . Tdap 04/29/2006, 09/01/2015  . Zoster 04/29/2006  . Zoster Recombinat (Shingrix) 06/11/2018, 09/10/2018   Pertinent  Health Maintenance Due  Topic Date Due  . INFLUENZA VACCINE  07/10/2021  . DEXA SCAN  Completed  . PNA vac Low Risk Adult  Completed   Fall Risk  11/16/2019 04/23/2019 04/09/2019 10/29/2018 10/27/2018  Falls in the past year? 0 0 0 0 1  Comment - - - fell June 2019 -  Number falls in past yr: - 0 0 - 0  Injury with Fall? - 0 0 - 1  Comment - - - - -  Risk for fall due to : - - - - -  Risk for fall due to: Comment - - - - -   Functional Status Survey:    Vitals:   04/07/21 1453  BP: (!) 104/58  Pulse: 68  Resp: 18  Temp: 97.8 F (36.6 C)  SpO2: 95%  Weight: 140 lb (63.5 kg)   Body mass index is 25.61 kg/m. Physical Exam Vitals and nursing note reviewed.  Constitutional:      Appearance: Normal appearance.  HENT:     Head: Normocephalic and atraumatic.     Mouth/Throat:     Mouth: Mucous membranes are moist.  Eyes:     Extraocular Movements: Extraocular movements intact.     Conjunctiva/sclera:  Conjunctivae normal.     Pupils: Pupils are equal, round, and  reactive to light.  Cardiovascular:     Rate and Rhythm: Normal rate and regular rhythm.     Heart sounds: Murmur heard.    Pulmonary:     Breath sounds: No rhonchi or rales.  Abdominal:     General: Bowel sounds are normal.     Palpations: Abdomen is soft.     Tenderness: There is no abdominal tenderness.  Musculoskeletal:        General: Tenderness present.     Cervical back: Normal range of motion and neck supple.     Right lower leg: Edema present.     Left lower leg: Edema present.     Comments: Trace edema BLE.  Arthritic changes in fingers, R>L. The R 3rd finger mid knuckle swelling, no heat or redness. Left ankle pain, in brace.   Skin:    General: Skin is warm and dry.     Comments: A marble sized hematoma occiput, abrasion mid thoracic spine.   Neurological:     General: No focal deficit present.     Mental Status: She is alert. Mental status is at baseline.     Gait: Gait abnormal.     Comments: Oriented to person, place. Ambulates with walker.   Psychiatric:        Mood and Affect: Mood normal.        Behavior: Behavior normal.     Labs reviewed: Recent Labs    04/21/20 0325 04/22/20 0605 04/22/20 0903 04/23/20 0350 04/24/20 0423 04/25/20 0427 05/24/20 1015 08/04/20 0000 09/08/20 0000 10/05/20 0000 11/22/20 1109  NA 143  --    < > 144   < > 146* 141   < > 141 142 141  K 3.4*  --    < > 3.1*   < > 3.8 3.5   < > 4.1 4.2 4.2  CL 115*  --    < > 119*   < > 115* 107   < > 105 107 104  CO2 20*  --    < > 21*   < > 26 24   < > 26* 25* 22  GLUCOSE 129*  --    < > 80   < > 88 100*  --   --   --  104*  BUN 35*  --    < > 33*   < > 16 12   < > '15 17 17  ' CREATININE 1.23*  --    < > 1.01*   < > 0.81 0.81   < > 0.9 0.9 1.01*  CALCIUM 7.2*  --    < > 7.2*   < > 7.6* 8.9   < > 10.0 9.6 10.0  MG 3.0* 2.6*  --  2.5*  --   --   --   --   --   --   --   PHOS 3.2 2.6  --  2.3*  --   --   --   --   --   --   --    < > = values in this interval not displayed.   Recent  Labs    04/20/20 0455 04/21/20 0325 04/24/20 0423 10/05/20 0000  AST 117* 50* 32 19  ALT '18 8 7 15  ' ALKPHOS 95 95 77 62  BILITOT 0.5 0.5 0.6  --   PROT 5.1* 5.4* 4.7*  --   ALBUMIN 2.3*  2.3* 2.3* 3.5   Recent Labs    04/24/20 0423 04/25/20 0427 04/26/20 0000 05/24/20 1015 06/21/20 0000 06/30/20 0000 08/04/20 0000 10/05/20 0000  WBC 14.5* 13.8*   < > 7.8   < > 9.1 6.2 4.8  NEUTROABS  --   --    < >  --    < > 6,406 3,677 2,328.00  HGB 8.8* 9.4*   < > 9.8*   < > 8.7* 8.9* 9.6*  HCT 27.1* 29.7*   < > 30.9*   < > 27* 28* 30*  MCV 97.5 96.7  --  98.4  --   --   --   --   PLT 194 226   < > 286   < > 443* 354 288   < > = values in this interval not displayed.   Lab Results  Component Value Date   TSH 1.04 10/05/2020   No results found for: HGBA1C Lab Results  Component Value Date   CHOL 105 10/05/2020   HDL 46 10/05/2020   LDLCALC 38 10/05/2020   TRIG 119 10/05/2020   CHOLHDL 2.4 07/14/2019    Significant Diagnostic Results in last 30 days:  No results found.  Assessment/Plan  Hypothyroidism Hypothyroidism, takes Levothyroxine 38mg qd.TSH 1.04 10/04/20, update TSH   Erosive (osteo)arthritis Erosive arthritis, takes Plaquenil 2072mqd, Tylenol 50073mid, f/u Rheumatology.  Chronic diastolic CHF (congestive heart failure) (HCC) CHF/trace edema BLE, takes Furosemide 82m36m, cardiology, severe aortic valve stenosis.Bun/creat 17/1.01. repeat CMP/eGFR  Iron deficiency anemia Anemia, takes Fe, Hgb 9.6 10/04/20. Update CBC/diff   Aortic stenosis AS Cardiology palliative approach   Chronic constipation Stable,  takes Senokot S I qd, Amitiza 24mc34m, Colace qd   Depression, psychotic (HCC) Depression/hallucination, improved but not resolved, takes Sertraline 75mg 28mQuetiapine qd   Parkinson's disease Parkinson's, takes MiraPex 1mg bi50mSinemet 25/100 II bid.   GERD (gastroesophageal reflux disease) Stable,  takesPantoprazole 40mg qd58mmotidine  82mg qd 29mal lithiasis Hx of rena lithiasis, recurrent UTI, urosepsis, takes Keflex for suppression therapy, f/u Urology   CKD (chronic kidney disease) stage 3, GFR 30-59 ml/min (HCC) 04/11/21 Na 140, K 4.2, Bun 18, creat 0.89, eGFR 58, TSH 1.62, wbc 6.8, Hgb 11.0, plt 282, neutrophils 61.%    Family/ staff Communication: plan of care reviewed with the patient and charge nurse.   Labs/tests ordered:  CBC/diff, CMP/eGFR, TSH  Time spend 35 minutes.

## 2021-04-07 NOTE — Assessment & Plan Note (Signed)
Hx of rena lithiasis, recurrent UTI, urosepsis, takes Keflex for suppression therapy, f/u Urology 

## 2021-04-10 ENCOUNTER — Other Ambulatory Visit: Payer: Self-pay

## 2021-04-10 ENCOUNTER — Ambulatory Visit (INDEPENDENT_AMBULATORY_CARE_PROVIDER_SITE_OTHER): Payer: Medicare Other | Admitting: Podiatry

## 2021-04-10 DIAGNOSIS — M79675 Pain in left toe(s): Secondary | ICD-10-CM

## 2021-04-10 DIAGNOSIS — M21619 Bunion of unspecified foot: Secondary | ICD-10-CM | POA: Diagnosis not present

## 2021-04-10 DIAGNOSIS — M722 Plantar fascial fibromatosis: Secondary | ICD-10-CM | POA: Diagnosis not present

## 2021-04-10 DIAGNOSIS — M79674 Pain in right toe(s): Secondary | ICD-10-CM | POA: Diagnosis not present

## 2021-04-10 DIAGNOSIS — B351 Tinea unguium: Secondary | ICD-10-CM | POA: Diagnosis not present

## 2021-04-11 DIAGNOSIS — E039 Hypothyroidism, unspecified: Secondary | ICD-10-CM | POA: Diagnosis not present

## 2021-04-11 DIAGNOSIS — D649 Anemia, unspecified: Secondary | ICD-10-CM | POA: Diagnosis not present

## 2021-04-12 DIAGNOSIS — N183 Chronic kidney disease, stage 3 unspecified: Secondary | ICD-10-CM | POA: Insufficient documentation

## 2021-04-12 LAB — BASIC METABOLIC PANEL
BUN: 18 (ref 4–21)
CO2: 25 — AB (ref 13–22)
Chloride: 104 (ref 99–108)
Creatinine: 0.9 (ref 0.5–1.1)
Glucose: 87
Potassium: 4.2 (ref 3.4–5.3)
Sodium: 140 (ref 137–147)

## 2021-04-12 LAB — CBC AND DIFFERENTIAL
HCT: 34 — AB (ref 36–46)
Hemoglobin: 11 — AB (ref 12.0–16.0)
Neutrophils Absolute: 4147
Platelets: 282 (ref 150–399)
WBC: 6.7

## 2021-04-12 LAB — COMPREHENSIVE METABOLIC PANEL
Albumin: 4.1 (ref 3.5–5.0)
Calcium: 9.8 (ref 8.7–10.7)
GFR calc Af Amer: 68
GFR calc non Af Amer: 58
Globulin: 2.4

## 2021-04-12 LAB — TSH: TSH: 1.62 (ref 0.41–5.90)

## 2021-04-12 LAB — CBC: RBC: 3.59 — AB (ref 3.87–5.11)

## 2021-04-12 LAB — HEPATIC FUNCTION PANEL
ALT: 8 (ref 7–35)
AST: 19 (ref 13–35)
Alkaline Phosphatase: 78 (ref 25–125)
Bilirubin, Total: 0.5

## 2021-04-12 NOTE — Progress Notes (Signed)
Subjective: 87 y.o. returns the office today for painful, elongated, thickened toenails which she cannot trim herself.  She presents today with her husband.  She is currently in physical therapy and she describing discomfort the bottom of her left heel most when she first stands up.  After she does walk it does get better.  She denies recent injury or falls.  She is also started get some discomfort on the bunion on her left foot.  Denies any systemic complaints such as fevers, chills, nausea, vomiting.   PCP: [name], MD   Objective: AAO 3, NAD DP/PT pulses palpable, CRT less than 3 seconds Nails hypertrophic, dystrophic, elongated, brittle, discolored 10. There is tenderness overlying the nails 1-5 bilaterally except for the left hallux toenail does not present. There is no surrounding erythema or drainage along the nail sites. No open lesions or pre-ulcerative lesions are identified. There is mild tenderness palpation on the plantar aspect the calcaneus at the insertion of the plantar fascia.  There is no pain with lateral compression of calcaneus.  No pain with Achilles tendon.  Moderate bunion is present causing some discomfort.  No other areas of discomfort. No pain with calf compression, warmth, erythema.  Assessment: Patient presents with symptomatic onychomycosis, neuropathy; bunion, plan fasciitis left side  Plan: -Treatment options including alternatives, risks, complications were discussed -Nails sharply debrided 9 without complication/bleeding. -Prescription for physical therapy written for her today.  Dispensed offloading pads for the bunion.  Discussed shoe modifications as well.  Can consider steroid injection as well but she did not want to proceed with this today.  Possibly the Parkinson's is contributing to her gait instability and discomfort.  Trula Slade DPM

## 2021-04-12 NOTE — Assessment & Plan Note (Signed)
04/11/21 Na 140, K 4.2, Bun 18, creat 0.89, eGFR 58, TSH 1.62, wbc 6.8, Hgb 11.0, plt 282, neutrophils 61.%

## 2021-04-17 ENCOUNTER — Non-Acute Institutional Stay (SKILLED_NURSING_FACILITY): Payer: Medicare Other | Admitting: Nurse Practitioner

## 2021-04-17 DIAGNOSIS — I5032 Chronic diastolic (congestive) heart failure: Secondary | ICD-10-CM | POA: Diagnosis not present

## 2021-04-17 DIAGNOSIS — M5440 Lumbago with sciatica, unspecified side: Secondary | ICD-10-CM | POA: Diagnosis not present

## 2021-04-17 DIAGNOSIS — E039 Hypothyroidism, unspecified: Secondary | ICD-10-CM

## 2021-04-17 DIAGNOSIS — G8929 Other chronic pain: Secondary | ICD-10-CM

## 2021-04-17 DIAGNOSIS — K5909 Other constipation: Secondary | ICD-10-CM

## 2021-04-17 DIAGNOSIS — I350 Nonrheumatic aortic (valve) stenosis: Secondary | ICD-10-CM

## 2021-04-17 DIAGNOSIS — N200 Calculus of kidney: Secondary | ICD-10-CM

## 2021-04-17 DIAGNOSIS — M154 Erosive (osteo)arthritis: Secondary | ICD-10-CM

## 2021-04-17 DIAGNOSIS — D508 Other iron deficiency anemias: Secondary | ICD-10-CM

## 2021-04-17 DIAGNOSIS — K219 Gastro-esophageal reflux disease without esophagitis: Secondary | ICD-10-CM | POA: Diagnosis not present

## 2021-04-17 DIAGNOSIS — G20 Parkinson's disease: Secondary | ICD-10-CM

## 2021-04-17 DIAGNOSIS — F323 Major depressive disorder, single episode, severe with psychotic features: Secondary | ICD-10-CM

## 2021-04-17 NOTE — Assessment & Plan Note (Signed)
Erosive arthritis, takes Plaquenil 200mg qd, Tylenol 500mg bid, f/u Rheumatology.  

## 2021-04-17 NOTE — Assessment & Plan Note (Signed)
Hypothyroidism, takes Levothyroxine 81mcg qd.TSH 1.62 04/11/21

## 2021-04-17 NOTE — Assessment & Plan Note (Addendum)
pain in groins, L>R, travels to the left thigh, positional, worse with weight bearing. Onset was after fall a week ago. She also c/o worsened lower back pain after fall.  Denied dysuria, abd pain, hematuria,  she is afebrile. Will obtain X-ray lumbosacral spine, pelvis, hips to r/o fxs. Obtain UA C/S in setting of kidney stones and recurrent UTI. Will try Tramadol 50mg /Tylenol 500mg  tid with meals for pain control.   04/18/21 X-ray R+L hip, lumbosacral spine, pelvis, s/p fusion L2, L3, L4, L5, S1, grade 1 anterolisthesis of L4 on L5, s/p R hip replacement.

## 2021-04-17 NOTE — Assessment & Plan Note (Signed)
takes Fe, Hgb 11 04/11/21 

## 2021-04-17 NOTE — Assessment & Plan Note (Signed)
Hx of rena lithiasis, recurrent UTI, urosepsis, takes Keflex for suppression therapy, f/u Urology

## 2021-04-17 NOTE — Assessment & Plan Note (Signed)
CHF/trace edema BLE, takes Furosemide 20mg  qd, cardiology, severe aortic valve stenosis.Bun/creat18/0.89 04/11/21, Echocardiogram 11/15/20 EF 60-65%

## 2021-04-17 NOTE — Assessment & Plan Note (Signed)
takesPantoprazole 40mg  qd, Famotidine 20mg  qd.yearly FOBT per GI

## 2021-04-17 NOTE — Progress Notes (Addendum)
Location:   SNF [facility]   Place of Service:  SNF (31) Provider: [facility] Carvell Hoeffner NP  [name], MD  Patient Care Team: [name], MD as PCP - General (Internal Medicine) [name], MD as PCP - Cardiology (Cardiology) [name], MD (Neurosurgery) [name], MD (Neurology) [name], MD (Inactive) as Consulting Physician (Gastroenterology) [name], MD as Consulting Physician (Urology) Woody Kronberg X, NP as Nurse Practitioner (Internal Medicine)  Extended Emergency Contact Information Primary Emergency Contact: [facility] Address: [address]          [place], [place] [address] [name] of [place] Phone: [phone] Mobile Phone: [phone] Relation: Spouse Secondary Emergency Contact: [name] Mobile Phone: [phone] Relation: Daughter Preferred language: [name] Interpreter needed? No  Code Status: DNR Goals of care: Advanced Directive information Advanced Directives 02/17/2021  Does Patient Have a Medical Advance Directive? Yes  Type of Advance Directive Out of facility DNR (pink MOST or yellow form);Living will  Does patient want to make changes to medical advance directive? -  Copy of Aneta in Chart? -  Would patient like information on creating a medical advance directive? -  Pre-existing out of facility DNR order (yellow form or pink MOST form) Yellow form placed in chart (order not valid for inpatient use)     Chief Complaint  Patient presents with  . Acute Visit    Pain in groins    HPI:  Pt is a 87 y.o. female seen today for an acute visit for pain in groins, L>R, travels to the left thigh, positional, worse with weight bearing. Onset was after fall a week ago. She also c/o worsened lower back pain after fall.  Denied dysuria, abd pain, hematuria,  she is afebrile.     Hx of rena lithiasis, recurrent UTI, urosepsis, takes Keflex for suppression therapy, f/u  Urology GERD, takesPantoprazole 40mg  qd, Famotidine 20mg  qd.yearly FOBT per GI Parkinson's, takes MiraPex 1mg  bid, Sinemet 25/100 II bid.  Depression/hallucination, takes Sertraline 75mg  qd, Quetiapineqd Constipation, takes Senokot S I qd, Amitiza 49mcgbid, Colace qd Hypothyroidism, takes Levothyroxine 26mcg qd.TSH 1.62 04/11/21 Erosive arthritis, takes Plaquenil 200mg  qd, Tylenol 500mg  bid, f/u Rheumatology. CHF/trace edema BLE, takes Furosemide 20mg  qd, cardiology, severe aortic valve stenosis.Bun/creat18/0.89 04/11/21, Echocardiogram 11/15/20 EF 60-65% Anemia, takes Fe, Hgb 11 04/11/21 AS Cardiology palliative approach  Past Medical History:  Diagnosis Date  . Abnormal nuclear stress test   . Actinic cheilitis 04/23/2019  . Anemia    iron deficiency   . Anxiety   . Aortic stenosis    severe by echo 2021   . Arthritis   . Broken arm    right   . Chronic diastolic CHF (congestive heart failure) ([place])   . Chronic pain   . Cognitive communication deficit   . Constipation   . Depression   . Erosive (osteo)arthritis 03/09/2020  . Family history of adverse reaction to anesthesia    pt. states sister vomits  . Fibromyalgia   . GERD (gastroesophageal reflux disease)   . H/O hiatal hernia   . Hallucinations, visual 07/21/2018   03/10/20 psych consult.   . Hematuria 05/18/2020  . History of bronchitis   . History of kidney stones   . Hypercholesterolemia   . Hypertension    dr t turner  . Hypothyroidism   . Memory loss 04/21/2018   08/08/18 CT head no traumatic findings, atrophy.  09/27/19 MMSE 25/30, failed the clock drawing  . Microhematuria  03/24/2015  . Neuropathy   . Osteoporosis   . Parkinson disease ([place])   . PVD (peripheral vascular disease) (HCC)    99% stenosis of left anteiror tibial artery, mod stenosis of left distal SFA and popliteal artery followed by [name]   . RBBB    noted on EKG 2018  . Restless leg syndrome   . Sepsis ([place])    hx of due to [facility]  . Septic shock ([place]) 04/19/2020  . Shortness of breath    with exertion - chronic   . Sjogren's disease ([place])   . Sjogren's syndrome [facility]) 02/04/2018   04/16/19 rheumatology: Erosive OA of hands, Sjogrens syndrome, low back pain at multiple sites, recurrent kidney stones, age related osteoporosis w/o current pathological fracture, f/u 6 months.   . SOB (shortness of breath)    chronic due to diastolic dysfunction, deconditioning, obesity  . Spinal stenosis    lumbar region   . Tremor   . Unstable gait 04/21/2018  . Unsteadiness on feet   . Urinary frequency 09/18/2018  . Urinary tract infection   . Visual hallucinations    Past Surgical History:  Procedure Laterality Date  . ABDOMINAL HYSTERECTOMY    . BACK SURGERY     4 back surgeries,   lumbar fusion  . CARDIAC CATHETERIZATION  2006   normal  . CYSTOSCOPY/URETEROSCOPY/HOLMIUM LASER/STENT PLACEMENT Left 01/06/2019   Procedure: CYSTOSCOPY/RETROGRADE/URETEROSCOPY/HOLMIUM LASER/BASKET RETRIEVAL/STENT PLACEMENT;  Surgeon: Ceasar Mons, MD;  Location: [facility];  Service: Urology;  Laterality: Left;  . CYSTOSCOPY/URETEROSCOPY/HOLMIUM LASER/STENT PLACEMENT Bilateral 05/24/2020   Procedure: CYSTOSCOPY/RETROGRADE/URETEROSCOPY/HOLMIUM LASER/STENT PLACEMENT;  Surgeon: Ceasar Mons, MD;  Location: [facility];  Service: Urology;  Laterality: Bilateral;  . EYE SURGERY Bilateral    cateracts  . falls     variious fall, broken wrist,and toes  . FRACTURE SURGERY Right April 2016   Wrist, Pt. fell  . IR NEPHROSTOMY PLACEMENT RIGHT  04/19/2020  . kidney stone removal Left 01/20/2019  . RIGHT/LEFT HEART CATH AND CORONARY ANGIOGRAPHY N/A 02/05/2018   Procedure: RIGHT/LEFT HEART CATH AND CORONARY ANGIOGRAPHY;  Surgeon: Troy Sine, MD;  Location: [facility];  Service: Cardiovascular;  Laterality: N/A;  . SPINAL CORD STIMULATOR  INSERTION N/A 09/09/2015   Procedure: LUMBAR SPINAL CORD STIMULATOR INSERTION;  Surgeon: Clydell Hakim, MD;  Location: [facility];  Service: Neurosurgery;  Laterality: N/A;  LUMBAR SPINAL CORD STIMULATOR INSERTION  . SPINE SURGERY  April 2013   Back X's 4  . TOTAL HIP ARTHROPLASTY     right  . WRIST FRACTURE SURGERY Bilateral     Allergies  Allergen Reactions  . Adrenalone   . Lactose Other (See Comments)    abd pain, lactose intolerant  . Morphine Other (See Comments)  . Sulfa Antibiotics Other (See Comments)    Headache, very sick  . Codeine Other (See Comments)    Reaction:  Headaches and nightmares   . Lactose Intolerance (Gi) Nausea And Vomiting  . Latex Rash  . Lyrica [Pregabalin] Swelling and Other (See Comments)    Reaction:  Leg swelling  . Other Other (See Comments)    Pt states that pain medications give her nightmares.    . Plaquenil [Hydroxychloroquine Sulfate] Other (See Comments)    Reaction:  GI upset   . Reglan [Metoclopramide] Other (See Comments)    Reaction:  GI upset   . Requip [Ropinirole Hcl] Other (See Comments)    Reaction:  GI upset   . Septra [Sulfamethoxazole-Trimethoprim] Nausea  And Vomiting  . Shellfish Allergy Nausea And Vomiting    Allergies as of 04/17/2021      Reactions   Adrenalone    Lactose Other (See Comments)   abd pain, lactose intolerant   Morphine Other (See Comments)   Sulfa Antibiotics Other (See Comments)   Headache, very sick   Codeine Other (See Comments)   Reaction:  Headaches and nightmares    Lactose Intolerance (gi) Nausea And Vomiting   Latex Rash   Lyrica [pregabalin] Swelling, Other (See Comments)   Reaction:  Leg swelling   Other Other (See Comments)   Pt states that pain medications give her nightmares.     Plaquenil [hydroxychloroquine Sulfate] Other (See Comments)   Reaction:  GI upset    Reglan [metoclopramide] Other (See Comments)   Reaction:  GI upset    Requip [ropinirole Hcl] Other (See Comments)    Reaction:  GI upset    Septra [sulfamethoxazole-trimethoprim] Nausea And Vomiting   Shellfish Allergy Nausea And Vomiting      Medication List       Accurate as of Apr 17, 2021 11:59 PM. If you have any questions, ask your nurse or doctor.        acetaminophen 500 MG tablet Commonly known as: TYLENOL Take 500 mg by mouth daily as needed for mild pain. not to exceed 3 gm in 24 hours. As needed for arthritis pain   acetaminophen 500 MG tablet Commonly known as: TYLENOL Take 500 mg by mouth 2 (two) times daily.   AeroChamber MV inhaler by Other route. Use as instructed as needed   albuterol 108 (90 Base) MCG/ACT inhaler Commonly known as: VENTOLIN HFA Inhale 2 puffs into the lungs every 6 (six) hours as needed for wheezing or shortness of breath.   aspirin EC 81 MG tablet Take 81 mg by mouth daily.   Calcium 600/Vitamin D3 600-800 MG-UNIT Tabs Generic drug: Calcium Carb-Cholecalciferol Take 1 tablet by mouth daily.   carbidopa-levodopa 25-100 MG tablet Commonly known as: SINEMET IR Take 2 tablets by mouth in the morning and at bedtime.   cephALEXin 250 MG capsule Commonly known as: KEFLEX Take 250 mg by mouth daily.   docusate sodium 100 MG capsule Commonly known as: COLACE Take 100 mg by mouth at bedtime.   eucerin cream Apply 1 application topically daily.   famotidine 20 MG tablet Commonly known as: PEPCID Take 20 mg by mouth daily.   furosemide 20 MG tablet Commonly known as: LASIX Take 20 mg by mouth daily as needed.   furosemide 20 MG tablet Commonly known as: LASIX Take 20 mg by mouth daily.   hydroxychloroquine 200 MG tablet Commonly known as: PLAQUENIL Take 200 mg by mouth daily.   lactose free nutrition Liqd Take 237 mLs by mouth as needed.   levothyroxine 50 MCG tablet Commonly known as: SYNTHROID Take 50 mcg by mouth daily before breakfast.   lubiprostone 24 MCG capsule Commonly known as: AMITIZA Take 24 mcg by mouth 2 (two) times  daily with a meal.   MULTIPLE MINERALS-VITAMINS PO Take 1 tablet by mouth daily.   nystatin powder Commonly known as: MYCOSTATIN/NYSTOP Apply topically 2 (two) times daily as needed.   pantoprazole 40 MG tablet Commonly known as: PROTONIX Take 40 mg by mouth daily.   potassium citrate 10 MEQ (1080 MG) SR tablet Commonly known as: UROCIT-K Take 10 mEq by mouth 2 (two) times daily.   pramipexole 1 MG tablet Commonly known as: MIRAPEX Take 1  mg by mouth 2 (two) times daily.   PRESERVISION AREDS 2 PO Take 1 tablet by mouth daily.   QUEtiapine 25 MG tablet Commonly known as: SEROQUEL Take 25 mg by mouth daily.   rosuvastatin 20 MG tablet Commonly known as: CRESTOR Take 10 mg by mouth daily.   saccharomyces boulardii 250 MG capsule Commonly known as: FLORASTOR Take 250 mg by mouth daily.   senna-docusate 8.6-50 MG tablet Commonly known as: Senokot-S Take 1 tablet by mouth at bedtime.   sertraline 50 MG tablet Commonly known as: ZOLOFT Take 75 mg by mouth at bedtime. 1-1/2 tablets to = 75 mg   Slow Fe 142 (45 Fe) MG Tbcr Generic drug: Ferrous Sulfate Take 1 tablet by mouth. Daily   Systane 0.4-0.3 % Soln Generic drug: Polyethyl Glycol-Propyl Glycol Apply 1 drop to eye 2 (two) times daily as needed (dry eyes).   Vitamin D 50 MCG (2000 UT) tablet Take 2,000 Units by mouth daily.       Review of Systems  Constitutional: Negative for fatigue, fever and unexpected weight change.  HENT: Positive for hearing loss. Negative for congestion and voice change.   Eyes: Negative for visual disturbance.  Respiratory: Positive for shortness of breath. Negative for cough.        Chronic DOE  Cardiovascular: Positive for leg swelling.  Gastrointestinal: Negative for abdominal pain and constipation.  Genitourinary: Negative for dysuria, hematuria and urgency.  Musculoskeletal: Positive for arthralgias, back pain, gait problem and myalgias.       Chronic lower back pain is  worse. New pain in the groins, L>R, travels to the left thigh, pain is worse with weight bearing, also positional.  The R 3rd finger mid knuckle pain is chronic.  Skin: Negative for color change.  Neurological: Positive for tremors. Negative for speech difficulty, weakness and headaches.       Memory lapses. Minimal resting tremor in fingers, not disabling.   Psychiatric/Behavioral: Positive for hallucinations. Negative for behavioral problems and sleep disturbance. The patient is not nervous/anxious.        On and off visual hallucination    Immunization History  Administered Date(s) Administered  . Hepatitis A, Adult 03/01/1997, 09/20/1997  . Influenza Split 09/09/2013, 09/27/2014  . Influenza, High Dose Seasonal PF 08/01/2011, 08/27/2012, 10/14/2013, 09/08/2015, 09/10/2016, 09/12/2017, 09/12/2018, 09/15/2019  . Influenza,inj,Quad PF,6+ Mos 09/11/2016  . Influenza-Unspecified 09/21/2020  . Moderna Sars-Covid-2 Vaccination 12/12/2019, 01/09/2020, 10/18/2020  . Pneumococcal Conjugate-13 04/15/2014  . Pneumococcal Polysaccharide-23 09/28/2005  . Td 06/20/2016  . Tdap 04/29/2006, 09/01/2015  . Zoster 04/29/2006  . Zoster Recombinat (Shingrix) 06/11/2018, 09/10/2018   Pertinent  Health Maintenance Due  Topic Date Due  . INFLUENZA VACCINE  07/10/2021  . DEXA SCAN  Completed  . PNA vac Low Risk Adult  Completed   Fall Risk  11/16/2019 04/23/2019 04/09/2019 10/29/2018 10/27/2018  Falls in the past year? 0 0 0 0 1  Comment - - - fell June 2019 -  Number falls in past yr: - 0 0 - 0  Injury with Fall? - 0 0 - 1  Comment - - - - -  Risk for fall due to : - - - - -  Risk for fall due to: Comment - - - - -   Functional Status Survey:    Vitals:   04/17/21 1333  BP: 106/60  Pulse: 78  Resp: 18  Temp: 97.9 F (36.6 C)  SpO2: 95%  Weight: 140 lb (63.5 kg)   Body mass  index is 25.61 kg/m. Physical Exam Vitals and nursing note reviewed.  Constitutional:      Appearance: Normal  appearance.  HENT:     Head: Normocephalic and atraumatic.     Mouth/Throat:     Mouth: Mucous membranes are moist.  Eyes:     Extraocular Movements: Extraocular movements intact.     Conjunctiva/sclera: Conjunctivae normal.     Pupils: Pupils are equal, round, and reactive to light.  Cardiovascular:     Rate and Rhythm: Normal rate and regular rhythm.     Heart sounds: Murmur heard.    Pulmonary:     Breath sounds: No rhonchi or rales.  Abdominal:     General: Bowel sounds are normal.     Palpations: Abdomen is soft.     Tenderness: There is no abdominal tenderness.  Musculoskeletal:        General: Tenderness present.     Cervical back: Normal range of motion and neck supple.     Right lower leg: Edema present.     Left lower leg: Edema present.     Comments: Trace edema BLE.  Arthritic changes in fingers, R>L. The R 3rd finger mid knuckle swelling, no heat or redness. Left ankle pain, in brace. Chronic lower back pain is worse. New pain in the groins since the fall a week ago, L>R, travels to the left thigh, pain is worse with weight bearing, ROM, and straight leg raise, also positional.   Skin:    General: Skin is warm and dry.     Comments: A marble sized hematoma occiput, abrasion mid thoracic spine.   Neurological:     General: No focal deficit present.     Mental Status: She is alert. Mental status is at baseline.     Gait: Gait abnormal.     Comments: Oriented to person, place. Ambulates with walker.   Psychiatric:        Mood and Affect: Mood normal.        Behavior: Behavior normal.     Labs reviewed: Recent Labs    04/21/20 0325 04/22/20 0605 04/22/20 0903 04/23/20 0350 04/24/20 0423 04/25/20 0427 05/24/20 1015 08/04/20 0000 09/08/20 0000 10/05/20 0000 11/22/20 1109  NA 143  --    < > 144   < > 146* 141   < > 141 142 141  K 3.4*  --    < > 3.1*   < > 3.8 3.5   < > 4.1 4.2 4.2  CL 115*  --    < > 119*   < > 115* 107   < > 105 107 104  CO2 20*  --     < > 21*   < > 26 24   < > 26* 25* 22  GLUCOSE 129*  --    < > 80   < > 88 100*  --   --   --  104*  BUN 35*  --    < > 33*   < > 16 12   < > 15 17 17   CREATININE 1.23*  --    < > 1.01*   < > 0.81 0.81   < > 0.9 0.9 1.01*  CALCIUM 7.2*  --    < > 7.2*   < > 7.6* 8.9   < > 10.0 9.6 10.0  MG 3.0* 2.6*  --  2.5*  --   --   --   --   --   --   --  PHOS 3.2 2.6  --  2.3*  --   --   --   --   --   --   --    < > = values in this interval not displayed.   Recent Labs    04/20/20 0455 04/21/20 0325 04/24/20 0423 10/05/20 0000  AST 117* 50* 32 19  ALT 18 8 7 15   ALKPHOS 95 95 77 62  BILITOT 0.5 0.5 0.6  --   PROT 5.1* 5.4* 4.7*  --   ALBUMIN 2.3* 2.3* 2.3* 3.5   Recent Labs    04/24/20 0423 04/25/20 0427 04/26/20 0000 05/24/20 1015 06/21/20 0000 06/30/20 0000 08/04/20 0000 10/05/20 0000  WBC 14.5* 13.8*   < > 7.8   < > 9.1 6.2 4.8  NEUTROABS  --   --    < >  --    < > 6,406 3,677 2,328.00  HGB 8.8* 9.4*   < > 9.8*   < > 8.7* 8.9* 9.6*  HCT 27.1* 29.7*   < > 30.9*   < > 27* 28* 30*  MCV 97.5 96.7  --  98.4  --   --   --   --   PLT 194 226   < > 286   < > 443* 354 288   < > = values in this interval not displayed.   Lab Results  Component Value Date   TSH 1.04 10/05/2020   No results found for: HGBA1C Lab Results  Component Value Date   CHOL 105 10/05/2020   HDL 46 10/05/2020   LDLCALC 38 10/05/2020   TRIG 119 10/05/2020   CHOLHDL 2.4 07/14/2019    Significant Diagnostic Results in last 30 days:  No results found.  Assessment/Plan: Chronic back pain pain in groins, L>R, travels to the left thigh, positional, worse with weight bearing. Onset was after fall a week ago. She also c/o worsened lower back pain after fall.  Denied dysuria, abd pain, hematuria,  she is afebrile. Will obtain X-ray lumbosacral spine, pelvis, hips to r/o fxs. Obtain UA C/S in setting of kidney stones and recurrent UTI. Will try Tramadol 50mg /Tylenol 500mg  tid with meals for pain control.    04/18/21 X-ray R+L hip, lumbosacral spine, pelvis, s/p fusion L2, L3, L4, L5, S1, grade 1 anterolisthesis of L4 on L5, s/p R hip replacement.   Renal lithiasis Hx of rena lithiasis, recurrent UTI, urosepsis, takes Keflex for suppression therapy, f/u Urology  GERD (gastroesophageal reflux disease) takesPantoprazole 40mg  qd, Famotidine 20mg  qd.yearly FOBT per GI   Parkinson's disease  takes MiraPex 1mg  bid, Sinemet 25/100 II bid.    Depression, psychotic ([place]) Depression/hallucination, takes Sertraline 75mg  qd, Quetiapineqd  Chronic constipation takes Senokot S I qd, Amitiza 29mcgbid, Colace qd  Hypothyroidism Hypothyroidism, takes Levothyroxine 60mcg qd.TSH 1.62 04/11/21   Erosive (osteo)arthritis Erosive arthritis, takes Plaquenil 200mg  qd, Tylenol 500mg  bid, f/u Rheumatology.  Chronic diastolic CHF (congestive heart failure) ([facility]) CHF/trace edema BLE, takes Furosemide 20mg  qd, cardiology, severe aortic valve stenosis.Bun/creat18/0.89 04/11/21, Echocardiogram 11/15/20 EF 60-65%   Iron deficiency anemia  takes Fe, Hgb 11 04/11/21   Aortic stenosis Cardiology palliative approach     Family/ staff Communication: plan of care reviewed with the patient and charge nurse.   Labs/tests ordered:  UA C/S. X-ray lubosacral spine, pelvis, hips.  annual FOBT per GI recommendation.   Time spend 35 minutes.

## 2021-04-17 NOTE — Assessment & Plan Note (Signed)
Depression/hallucination, takes Sertraline 75mg qd, Quetiapine qd  

## 2021-04-17 NOTE — Assessment & Plan Note (Signed)
takes Senokot S I qd, Amitiza 24mcg bid, Colace qd 

## 2021-04-17 NOTE — Assessment & Plan Note (Signed)
takes MiraPex 1mg bid, Sinemet 25/100 II bid. 

## 2021-04-17 NOTE — Assessment & Plan Note (Signed)
Cardiology palliative approach  

## 2021-04-18 ENCOUNTER — Encounter: Payer: Self-pay | Admitting: Nurse Practitioner

## 2021-04-18 DIAGNOSIS — M79672 Pain in left foot: Secondary | ICD-10-CM | POA: Diagnosis not present

## 2021-04-18 DIAGNOSIS — M545 Low back pain, unspecified: Secondary | ICD-10-CM | POA: Diagnosis not present

## 2021-04-18 DIAGNOSIS — M722 Plantar fascial fibromatosis: Secondary | ICD-10-CM | POA: Diagnosis not present

## 2021-04-18 DIAGNOSIS — M6281 Muscle weakness (generalized): Secondary | ICD-10-CM | POA: Diagnosis not present

## 2021-04-18 DIAGNOSIS — R29898 Other symptoms and signs involving the musculoskeletal system: Secondary | ICD-10-CM | POA: Diagnosis not present

## 2021-04-18 DIAGNOSIS — M25672 Stiffness of left ankle, not elsewhere classified: Secondary | ICD-10-CM | POA: Diagnosis not present

## 2021-04-18 DIAGNOSIS — R102 Pelvic and perineal pain: Secondary | ICD-10-CM | POA: Diagnosis not present

## 2021-04-18 DIAGNOSIS — M25512 Pain in left shoulder: Secondary | ICD-10-CM | POA: Diagnosis not present

## 2021-04-18 DIAGNOSIS — M25552 Pain in left hip: Secondary | ICD-10-CM | POA: Diagnosis not present

## 2021-04-18 DIAGNOSIS — M25522 Pain in left elbow: Secondary | ICD-10-CM | POA: Diagnosis not present

## 2021-04-18 DIAGNOSIS — M25551 Pain in right hip: Secondary | ICD-10-CM | POA: Diagnosis not present

## 2021-04-18 DIAGNOSIS — R2681 Unsteadiness on feet: Secondary | ICD-10-CM | POA: Diagnosis not present

## 2021-04-19 DIAGNOSIS — R29898 Other symptoms and signs involving the musculoskeletal system: Secondary | ICD-10-CM | POA: Diagnosis not present

## 2021-04-19 DIAGNOSIS — M25672 Stiffness of left ankle, not elsewhere classified: Secondary | ICD-10-CM | POA: Diagnosis not present

## 2021-04-19 DIAGNOSIS — M25522 Pain in left elbow: Secondary | ICD-10-CM | POA: Diagnosis not present

## 2021-04-19 DIAGNOSIS — M79672 Pain in left foot: Secondary | ICD-10-CM | POA: Diagnosis not present

## 2021-04-19 DIAGNOSIS — M6281 Muscle weakness (generalized): Secondary | ICD-10-CM | POA: Diagnosis not present

## 2021-04-19 DIAGNOSIS — M25512 Pain in left shoulder: Secondary | ICD-10-CM | POA: Diagnosis not present

## 2021-04-21 DIAGNOSIS — M25522 Pain in left elbow: Secondary | ICD-10-CM | POA: Diagnosis not present

## 2021-04-21 DIAGNOSIS — M25512 Pain in left shoulder: Secondary | ICD-10-CM | POA: Diagnosis not present

## 2021-04-21 DIAGNOSIS — M25672 Stiffness of left ankle, not elsewhere classified: Secondary | ICD-10-CM | POA: Diagnosis not present

## 2021-04-21 DIAGNOSIS — M6281 Muscle weakness (generalized): Secondary | ICD-10-CM | POA: Diagnosis not present

## 2021-04-21 DIAGNOSIS — R29898 Other symptoms and signs involving the musculoskeletal system: Secondary | ICD-10-CM | POA: Diagnosis not present

## 2021-04-21 DIAGNOSIS — M79672 Pain in left foot: Secondary | ICD-10-CM | POA: Diagnosis not present

## 2021-04-24 DIAGNOSIS — L820 Inflamed seborrheic keratosis: Secondary | ICD-10-CM | POA: Diagnosis not present

## 2021-04-24 DIAGNOSIS — L814 Other melanin hyperpigmentation: Secondary | ICD-10-CM | POA: Diagnosis not present

## 2021-04-24 DIAGNOSIS — L821 Other seborrheic keratosis: Secondary | ICD-10-CM | POA: Diagnosis not present

## 2021-04-24 DIAGNOSIS — L570 Actinic keratosis: Secondary | ICD-10-CM | POA: Diagnosis not present

## 2021-04-24 DIAGNOSIS — Z85068 Personal history of other malignant neoplasm of small intestine: Secondary | ICD-10-CM | POA: Diagnosis not present

## 2021-04-24 DIAGNOSIS — L988 Other specified disorders of the skin and subcutaneous tissue: Secondary | ICD-10-CM | POA: Diagnosis not present

## 2021-04-25 DIAGNOSIS — M25522 Pain in left elbow: Secondary | ICD-10-CM | POA: Diagnosis not present

## 2021-04-25 DIAGNOSIS — R29898 Other symptoms and signs involving the musculoskeletal system: Secondary | ICD-10-CM | POA: Diagnosis not present

## 2021-04-25 DIAGNOSIS — M79672 Pain in left foot: Secondary | ICD-10-CM | POA: Diagnosis not present

## 2021-04-25 DIAGNOSIS — M6281 Muscle weakness (generalized): Secondary | ICD-10-CM | POA: Diagnosis not present

## 2021-04-25 DIAGNOSIS — M25672 Stiffness of left ankle, not elsewhere classified: Secondary | ICD-10-CM | POA: Diagnosis not present

## 2021-04-25 DIAGNOSIS — M25512 Pain in left shoulder: Secondary | ICD-10-CM | POA: Diagnosis not present

## 2021-04-26 DIAGNOSIS — M25522 Pain in left elbow: Secondary | ICD-10-CM | POA: Diagnosis not present

## 2021-04-26 DIAGNOSIS — M25512 Pain in left shoulder: Secondary | ICD-10-CM | POA: Diagnosis not present

## 2021-04-26 DIAGNOSIS — M6281 Muscle weakness (generalized): Secondary | ICD-10-CM | POA: Diagnosis not present

## 2021-04-26 DIAGNOSIS — R29898 Other symptoms and signs involving the musculoskeletal system: Secondary | ICD-10-CM | POA: Diagnosis not present

## 2021-04-26 DIAGNOSIS — M25672 Stiffness of left ankle, not elsewhere classified: Secondary | ICD-10-CM | POA: Diagnosis not present

## 2021-04-26 DIAGNOSIS — M79672 Pain in left foot: Secondary | ICD-10-CM | POA: Diagnosis not present

## 2021-04-27 DIAGNOSIS — M25522 Pain in left elbow: Secondary | ICD-10-CM | POA: Diagnosis not present

## 2021-04-27 DIAGNOSIS — M79672 Pain in left foot: Secondary | ICD-10-CM | POA: Diagnosis not present

## 2021-04-27 DIAGNOSIS — M25512 Pain in left shoulder: Secondary | ICD-10-CM | POA: Diagnosis not present

## 2021-04-27 DIAGNOSIS — R29898 Other symptoms and signs involving the musculoskeletal system: Secondary | ICD-10-CM | POA: Diagnosis not present

## 2021-04-27 DIAGNOSIS — M25672 Stiffness of left ankle, not elsewhere classified: Secondary | ICD-10-CM | POA: Diagnosis not present

## 2021-04-27 DIAGNOSIS — M6281 Muscle weakness (generalized): Secondary | ICD-10-CM | POA: Diagnosis not present

## 2021-04-28 DIAGNOSIS — R29898 Other symptoms and signs involving the musculoskeletal system: Secondary | ICD-10-CM | POA: Diagnosis not present

## 2021-04-28 DIAGNOSIS — M25512 Pain in left shoulder: Secondary | ICD-10-CM | POA: Diagnosis not present

## 2021-04-28 DIAGNOSIS — M25522 Pain in left elbow: Secondary | ICD-10-CM | POA: Diagnosis not present

## 2021-04-28 DIAGNOSIS — M25672 Stiffness of left ankle, not elsewhere classified: Secondary | ICD-10-CM | POA: Diagnosis not present

## 2021-04-28 DIAGNOSIS — M79672 Pain in left foot: Secondary | ICD-10-CM | POA: Diagnosis not present

## 2021-04-28 DIAGNOSIS — M6281 Muscle weakness (generalized): Secondary | ICD-10-CM | POA: Diagnosis not present

## 2021-05-01 ENCOUNTER — Non-Acute Institutional Stay (SKILLED_NURSING_FACILITY): Payer: Medicare Other | Admitting: Orthopedic Surgery

## 2021-05-01 ENCOUNTER — Encounter: Payer: Self-pay | Admitting: Orthopedic Surgery

## 2021-05-01 DIAGNOSIS — S2242XA Multiple fractures of ribs, left side, initial encounter for closed fracture: Secondary | ICD-10-CM | POA: Diagnosis not present

## 2021-05-01 DIAGNOSIS — R079 Chest pain, unspecified: Secondary | ICD-10-CM | POA: Diagnosis not present

## 2021-05-01 NOTE — Progress Notes (Signed)
Location:   [facility] Room Number: [phone] Place of Service:  SNF [id number]) Provider:  Windell Moulding, NP    Patient Care Team: [name], MD as PCP - General (Internal Medicine) [name], MD as PCP - Cardiology (Cardiology) [name], MD (Neurosurgery) [name], MD (Neurology) [name], MD (Inactive) as Consulting Physician (Gastroenterology) [name], MD as Consulting Physician (Urology) [name], Man X, NP as Nurse Practitioner (Internal Medicine)  Extended Emergency Contact Information Primary Emergency Contact: [facility] Address: [address]          [place],  [address] [name] Phone: [phone] Mobile Phone: [phone] Relation: Spouse Secondary Emergency Contact: [name] Mobile Phone: [phone] Relation: Daughter Preferred language: [name] Interpreter needed? No  Code Status:  DNR Goals of care: Advanced Directive information Advanced Directives 05/01/2021  Does Patient Have a Medical Advance Directive? Yes  Type of Advance Directive Living will;Out of facility DNR (pink MOST or yellow form)  Does patient want to make changes to medical advance directive? No - Patient declined  Copy of [place] in Chart? -  Would patient like information on creating a medical advance directive? -  Pre-existing out of facility DNR order (yellow form or pink MOST form) Yellow form placed in chart (order not valid for inpatient use)     Chief Complaint  Patient presents with  . Acute Visit    Fractured ribs.    HPI:  Pt is a 87 y.o. female seen today for an acute visit for left sided pain, fractured ribs.   05/20 she was found on the floor by staff. She was trying to get from the chair to the bed using the walker. No apparent injury at the time, vitals stable. 05/22 she began to complain of left sided flank pain. In addition, a large bruise to left side  near scapula noted. Husband requested xray of the chest. Chest xray revealed acute nondisplaced fracture at left 7th and 8th ribs. No pneumothorax or subcutaneous emphysema noted. Rib pain rated 5/10. Pain simulated with movement. On call provider started her on Tramadol 50 mg tid for pain. She reports her pain has improved since starting Tramadol. She continues to work with PT/OT for strength, gait and balance training.   Nurse does not report any concerns, vitals stable.    Past Medical History:  Diagnosis Date  . Abnormal nuclear stress test   . Actinic cheilitis 04/23/2019  . Anemia    iron deficiency   . Anxiety   . Aortic stenosis    severe by echo 2021   . Arthritis   . Broken arm    right   . Chronic diastolic CHF (congestive heart failure) ([place])   . Chronic pain   . Cognitive communication deficit   . Constipation   . Depression   . Erosive (osteo)arthritis 03/09/2020  . Family history of adverse reaction to anesthesia    pt. states sister vomits  . Fibromyalgia   . GERD (gastroesophageal reflux disease)   . H/O hiatal hernia   . Hallucinations, visual 07/21/2018   03/10/20 psych consult.   . Hematuria 05/18/2020  . History of bronchitis   . History of kidney stones   . Hypercholesterolemia   . Hypertension    dr t turner  . Hypothyroidism   . Memory loss 04/21/2018   08/08/18 CT head no traumatic findings, atrophy.  09/27/19 MMSE 25/30, failed the clock drawing  .  Microhematuria 03/24/2015  . Neuropathy   . Osteoporosis   . Parkinson disease ([place])   . PVD (peripheral vascular disease) ([facility])    99% stenosis of left anteiror tibial artery, mod stenosis of left distal SFA and popliteal artery followed by [name]  . RBBB    noted on EKG 2018  . Restless leg syndrome   . Sepsis ([place])    hx of due to [facility]  . Septic shock ([place]) 04/19/2020  . Shortness of breath    with exertion - chronic   . Sjogren's disease ([place])   . Sjogren's syndrome [facility]) 02/04/2018   04/16/19  rheumatology: Erosive OA of hands, Sjogrens syndrome, low back pain at multiple sites, recurrent kidney stones, age related osteoporosis w/o current pathological fracture, f/u 6 months.   . SOB (shortness of breath)    chronic due to diastolic dysfunction, deconditioning, obesity  . Spinal stenosis    lumbar region   . Tremor   . Unstable gait 04/21/2018  . Unsteadiness on feet   . Urinary frequency 09/18/2018  . Urinary tract infection   . Visual hallucinations    Past Surgical History:  Procedure Laterality Date  . ABDOMINAL HYSTERECTOMY    . BACK SURGERY     4 back surgeries,   lumbar fusion  . CARDIAC CATHETERIZATION  2006   normal  . CYSTOSCOPY/URETEROSCOPY/HOLMIUM LASER/STENT PLACEMENT Left 01/06/2019   Procedure: CYSTOSCOPY/RETROGRADE/URETEROSCOPY/HOLMIUM LASER/BASKET RETRIEVAL/STENT PLACEMENT;  Surgeon: Ceasar Mons, MD;  Location: [facility];  Service: Urology;  Laterality: Left;  . CYSTOSCOPY/URETEROSCOPY/HOLMIUM LASER/STENT PLACEMENT Bilateral 05/24/2020   Procedure: CYSTOSCOPY/RETROGRADE/URETEROSCOPY/HOLMIUM LASER/STENT PLACEMENT;  Surgeon: Ceasar Mons, MD;  Location: [facility];  Service: Urology;  Laterality: Bilateral;  . EYE SURGERY Bilateral    cateracts  . falls     variious fall, broken wrist,and toes  . FRACTURE SURGERY Right April 2016   Wrist, Pt. fell  . IR NEPHROSTOMY PLACEMENT RIGHT  04/19/2020  . kidney stone removal Left 01/20/2019  . RIGHT/LEFT HEART CATH AND CORONARY ANGIOGRAPHY N/A 02/05/2018   Procedure: RIGHT/LEFT HEART CATH AND CORONARY ANGIOGRAPHY;  Surgeon: Troy Sine, MD;  Location: [place] [place] [facility];  Service: Cardiovascular;  Laterality: N/A;  . SPINAL CORD STIMULATOR INSERTION N/A 09/09/2015   Procedure: LUMBAR SPINAL CORD STIMULATOR INSERTION;  Surgeon: Clydell Hakim, MD;  Location: [facility] OR[facility];  Service: Neurosurgery;  Laterality: N/A;  LUMBAR SPINAL CORD STIMULATOR INSERTION  . SPINE SURGERY  April 2013   Back X's 4  .  TOTAL HIP ARTHROPLASTY     right  . WRIST FRACTURE SURGERY Bilateral     Allergies  Allergen Reactions  . Adrenalone   . Lactose Other (See Comments)    abd pain, lactose intolerant  . Morphine Other (See Comments)  . Sulfa Antibiotics Other (See Comments)    Headache, very sick  . Codeine Other (See Comments)    Reaction:  Headaches and nightmares   . Lactose Intolerance (Gi) Nausea And Vomiting  . Latex Rash  . Lyrica [Pregabalin] Swelling and Other (See Comments)    Reaction:  Leg swelling  . Other Other (See Comments)    Pt states that pain medications give her nightmares.    . Plaquenil [Hydroxychloroquine Sulfate] Other (See Comments)    Reaction:  GI upset   . Reglan [Metoclopramide] Other (See Comments)    Reaction:  GI upset   . Requip [Ropinirole Hcl] Other (See Comments)    Reaction:  GI upset   . Septra [Sulfamethoxazole-Trimethoprim]  Nausea And Vomiting  . Shellfish Allergy Nausea And Vomiting    Allergies as of 05/01/2021      Reactions   Adrenalone    Lactose Other (See Comments)   abd pain, lactose intolerant   Morphine Other (See Comments)   Sulfa Antibiotics Other (See Comments)   Headache, very sick   Codeine Other (See Comments)   Reaction:  Headaches and nightmares    Lactose Intolerance (gi) Nausea And Vomiting   Latex Rash   Lyrica [pregabalin] Swelling, Other (See Comments)   Reaction:  Leg swelling   Other Other (See Comments)   Pt states that pain medications give her nightmares.     Plaquenil [hydroxychloroquine Sulfate] Other (See Comments)   Reaction:  GI upset    Reglan [metoclopramide] Other (See Comments)   Reaction:  GI upset    Requip [ropinirole Hcl] Other (See Comments)   Reaction:  GI upset    Septra [sulfamethoxazole-trimethoprim] Nausea And Vomiting   Shellfish Allergy Nausea And Vomiting      Medication List       Accurate as of May 01, 2021  3:15 PM. If you have any questions, ask your nurse or doctor.         acetaminophen 500 MG tablet Commonly known as: TYLENOL Take 500 mg by mouth 3 (three) times daily as needed for mild pain. not to exceed 3 gm in 24 hours. As needed for arthritis pain What changed: Another medication with the same name was removed. Continue taking this medication, and follow the directions you see here. Changed by: Yvonna Alanis, NP   AeroChamber MV inhaler by Other route. Use as instructed as needed   albuterol 108 (90 Base) MCG/ACT inhaler Commonly known as: VENTOLIN HFA Inhale 2 puffs into the lungs every 6 (six) hours as needed for wheezing or shortness of breath.   aspirin EC 81 MG tablet Take 81 mg by mouth daily.   Calcium 600/Vitamin D3 600-800 MG-UNIT Tabs Generic drug: Calcium Carb-Cholecalciferol Take 1 tablet by mouth daily.   carbidopa-levodopa 25-100 MG tablet Commonly known as: SINEMET IR Take 2 tablets by mouth in the morning and at bedtime.   cephALEXin 250 MG capsule Commonly known as: KEFLEX Take 250 mg by mouth daily.   docusate sodium 100 MG capsule Commonly known as: COLACE Take 100 mg by mouth at bedtime.   eucerin cream Apply 1 application topically daily.   famotidine 20 MG tablet Commonly known as: PEPCID Take 20 mg by mouth daily.   furosemide 20 MG tablet Commonly known as: LASIX Take 20 mg by mouth daily as needed.   furosemide 20 MG tablet Commonly known as: LASIX Take 20 mg by mouth daily.   hydroxychloroquine 200 MG tablet Commonly known as: PLAQUENIL Take 200 mg by mouth daily.   lactose free nutrition Liqd Take 237 mLs by mouth as needed.   levothyroxine 50 MCG tablet Commonly known as: SYNTHROID Take 50 mcg by mouth daily before breakfast.   lubiprostone 24 MCG capsule Commonly known as: AMITIZA Take 24 mcg by mouth 2 (two) times daily with a meal.   MULTIPLE MINERALS-VITAMINS PO Take 1 tablet by mouth daily.   nystatin powder Commonly known as: MYCOSTATIN/NYSTOP Apply topically 2 (two) times daily  as needed.   pantoprazole 40 MG tablet Commonly known as: PROTONIX Take 40 mg by mouth daily.   potassium citrate 10 MEQ (1080 MG) SR tablet Commonly known as: UROCIT-K Take 10 mEq by mouth 2 (two) times  daily.   pramipexole 1 MG tablet Commonly known as: MIRAPEX Take 1 mg by mouth 2 (two) times daily.   PRESERVISION AREDS 2 PO Take 1 tablet by mouth daily.   QUEtiapine 25 MG tablet Commonly known as: SEROQUEL Take 25 mg by mouth daily.   rosuvastatin 20 MG tablet Commonly known as: CRESTOR Take 10 mg by mouth daily.   saccharomyces boulardii 250 MG capsule Commonly known as: FLORASTOR Take 250 mg by mouth daily.   senna-docusate 8.6-50 MG tablet Commonly known as: Senokot-S Take 1 tablet by mouth at bedtime.   sertraline 50 MG tablet Commonly known as: ZOLOFT Take 75 mg by mouth at bedtime. 1-1/2 tablets to = 75 mg   Slow Fe 142 (45 Fe) MG Tbcr Generic drug: Ferrous Sulfate Take 1 tablet by mouth. Daily   Systane 0.4-0.3 % Soln Generic drug: Polyethyl Glycol-Propyl Glycol Apply 1 drop to eye 2 (two) times daily as needed (dry eyes).   traMADol 50 MG tablet Commonly known as: ULTRAM Take 50 mg by mouth 3 (three) times daily.   Vitamin D 50 MCG (2000 UT) tablet Take 2,000 Units by mouth daily.       Review of Systems  Constitutional: Negative for activity change, appetite change, fatigue and fever.  Respiratory: Negative for cough, shortness of breath and wheezing.   Cardiovascular: Positive for chest pain. Negative for leg swelling.  Musculoskeletal: Positive for arthralgias and gait problem.       Recent fall   Skin:       Left sided bruise  Psychiatric/Behavioral: Positive for confusion. Negative for dysphoric mood. The patient is not nervous/anxious.     Immunization History  Administered Date(s) Administered  . Hepatitis A, Adult 03/01/1997, 09/20/1997  . Influenza Split 09/09/2013, 09/27/2014  . Influenza, High Dose Seasonal PF 08/01/2011,  08/27/2012, 10/14/2013, 09/08/2015, 09/10/2016, 09/12/2017, 09/12/2018, 09/15/2019  . Influenza,inj,Quad PF,6+ Mos 09/11/2016  . Influenza-Unspecified 09/21/2020  . Moderna Sars-Covid-2 Vaccination 12/12/2019, 01/09/2020, 10/18/2020  . Pneumococcal Conjugate-13 04/15/2014  . Pneumococcal Polysaccharide-23 09/28/2005  . Td 06/20/2016  . Tdap 04/29/2006, 09/01/2015  . Zoster 04/29/2006  . Zoster Recombinat (Shingrix) 06/11/2018, 09/10/2018   Pertinent  Health Maintenance Due  Topic Date Due  . INFLUENZA VACCINE  07/10/2021  . DEXA SCAN  Completed  . PNA vac Low Risk Adult  Completed   Fall Risk  11/16/2019 04/23/2019 04/09/2019 10/29/2018 10/27/2018  Falls in the past year? 0 0 0 0 1  Comment - - - fell June 2019 -  Number falls in past yr: - 0 0 - 0  Injury with Fall? - 0 0 - 1  Comment - - - - -  Risk for fall due to : - - - - -  Risk for fall due to: Comment - - - - -   Functional Status Survey:    Vitals:   05/01/21 1500  BP: 139/86  Pulse: 84  Resp: 20  Temp: 97.8 F (36.6 C)  SpO2: 93%  Weight: 132 lb 3.2 oz (60 kg)  Height: 5\' 2"  (1.575 m)   Body mass index is 24.18 kg/m. Physical Exam Vitals reviewed.  Constitutional:      General: She is not in acute distress. HENT:     Head: Normocephalic.  Cardiovascular:     Rate and Rhythm: Normal rate and regular rhythm.     Pulses: Normal pulses.     Heart sounds: No murmur heard.   Pulmonary:     Effort: Pulmonary effort is  normal. No respiratory distress.     Breath sounds: Normal breath sounds. No wheezing.  Abdominal:     General: Abdomen is flat. Bowel sounds are normal. There is no distension.     Palpations: Abdomen is soft.     Tenderness: There is no abdominal tenderness.  Musculoskeletal:     Right lower leg: Edema present.     Left lower leg: Edema present.     Comments: +1 pitting  Skin:    General: Skin is warm and dry.     Capillary Refill: Capillary refill takes less than 2 seconds.      Comments: Golf ball sized bruise to left side near left scapula and underarm. No swelling. Skin bluish-yellow. Tender to touch. No skin breakdown present.   Neurological:     General: No focal deficit present.     Mental Status: She is alert. Mental status is at baseline.     Motor: Weakness present.     Gait: Gait abnormal.     Comments: walker  Psychiatric:        Mood and Affect: Mood normal.        Behavior: Behavior normal.     Comments: forgetful     Labs reviewed: Recent Labs    05/24/20 1015 08/04/20 0000 10/05/20 0000 11/22/20 1109 04/12/21 0005  NA 141   < > 142 141 140  K 3.5   < > 4.2 4.2 4.2  CL 107   < > 107 104 104  CO2 24   < > 25* 22 25*  GLUCOSE 100*  --   --  104*  --   BUN 12   < > 17 17 18   CREATININE 0.81   < > 0.9 1.01* 0.9  CALCIUM 8.9   < > 9.6 10.0 9.8   < > = values in this interval not displayed.   Recent Labs    10/05/20 0000 04/12/21 0005  AST 19 19  ALT 15 8  ALKPHOS 62 78  ALBUMIN 3.5 4.1   Recent Labs    05/24/20 1015 06/21/20 0000 08/04/20 0000 10/05/20 0000 04/12/21 0005  WBC 7.8   < > 6.2 4.8 6.7  NEUTROABS  --    < > 3,677 2,328.00 4,147.00  HGB 9.8*   < > 8.9* 9.6* 11.0*  HCT 30.9*   < > 28* 30* 34*  MCV 98.4  --   --   --   --   PLT 286   < > 354 288 282   < > = values in this interval not displayed.   Lab Results  Component Value Date   TSH 1.62 04/12/2021   No results found for: HGBA1C Lab Results  Component Value Date   CHOL 105 10/05/2020   HDL 46 10/05/2020   LDLCALC 38 10/05/2020   TRIG 119 10/05/2020   CHOLHDL 2.4 07/14/2019    Significant Diagnostic Results in last 30 days:  No results found.  Assessment/Plan 1. Closed fracture of multiple ribs of left side, initial encounter - xray revealed acute nondisplaced fracture to left 7th and 8th ribs - fracture due to fall occurring 05/20 - cont tylenol 500 mg po tid  - cont Tramadol 50 mg po tid for pain - cont PT/OT - incentive spirometer tid for  pneumonia prevention x 10 days   Family/ staff Communication: plan discussed with patient, husband and nurse  Labs/tests ordered:  none

## 2021-05-02 ENCOUNTER — Other Ambulatory Visit: Payer: Self-pay | Admitting: *Deleted

## 2021-05-02 ENCOUNTER — Non-Acute Institutional Stay (SKILLED_NURSING_FACILITY): Payer: Medicare Other | Admitting: Internal Medicine

## 2021-05-02 ENCOUNTER — Encounter: Payer: Self-pay | Admitting: Internal Medicine

## 2021-05-02 DIAGNOSIS — I350 Nonrheumatic aortic (valve) stenosis: Secondary | ICD-10-CM | POA: Diagnosis not present

## 2021-05-02 DIAGNOSIS — G20 Parkinson's disease: Secondary | ICD-10-CM

## 2021-05-02 DIAGNOSIS — R29898 Other symptoms and signs involving the musculoskeletal system: Secondary | ICD-10-CM | POA: Diagnosis not present

## 2021-05-02 DIAGNOSIS — M154 Erosive (osteo)arthritis: Secondary | ICD-10-CM

## 2021-05-02 DIAGNOSIS — Z9181 History of falling: Secondary | ICD-10-CM

## 2021-05-02 DIAGNOSIS — S2242XD Multiple fractures of ribs, left side, subsequent encounter for fracture with routine healing: Secondary | ICD-10-CM

## 2021-05-02 DIAGNOSIS — K5909 Other constipation: Secondary | ICD-10-CM | POA: Diagnosis not present

## 2021-05-02 DIAGNOSIS — F323 Major depressive disorder, single episode, severe with psychotic features: Secondary | ICD-10-CM | POA: Diagnosis not present

## 2021-05-02 DIAGNOSIS — N390 Urinary tract infection, site not specified: Secondary | ICD-10-CM

## 2021-05-02 DIAGNOSIS — I5032 Chronic diastolic (congestive) heart failure: Secondary | ICD-10-CM | POA: Diagnosis not present

## 2021-05-02 DIAGNOSIS — M25512 Pain in left shoulder: Secondary | ICD-10-CM | POA: Diagnosis not present

## 2021-05-02 DIAGNOSIS — D508 Other iron deficiency anemias: Secondary | ICD-10-CM | POA: Diagnosis not present

## 2021-05-02 DIAGNOSIS — E039 Hypothyroidism, unspecified: Secondary | ICD-10-CM

## 2021-05-02 DIAGNOSIS — M25672 Stiffness of left ankle, not elsewhere classified: Secondary | ICD-10-CM | POA: Diagnosis not present

## 2021-05-02 DIAGNOSIS — M79672 Pain in left foot: Secondary | ICD-10-CM | POA: Diagnosis not present

## 2021-05-02 DIAGNOSIS — N1831 Chronic kidney disease, stage 3a: Secondary | ICD-10-CM | POA: Diagnosis not present

## 2021-05-02 DIAGNOSIS — M6281 Muscle weakness (generalized): Secondary | ICD-10-CM | POA: Diagnosis not present

## 2021-05-02 DIAGNOSIS — M25522 Pain in left elbow: Secondary | ICD-10-CM | POA: Diagnosis not present

## 2021-05-02 MED ORDER — TRAMADOL HCL 50 MG PO TABS: 50.0000 mg | ORAL_TABLET | Freq: Three times a day (TID) | ORAL | 0 refills | Status: DC

## 2021-05-02 NOTE — Progress Notes (Signed)
Location:   [facility] Room Number: 45 Place of Service:  SNF [id number]) Provider:  [name] MD  [name], MD  Patient Care Team: [name], MD as PCP - General (Internal Medicine) [name], MD as PCP - Cardiology (Cardiology) [name], MD (Neurosurgery) [name], MD (Neurology) [name], MD (Inactive) as Consulting Physician (Gastroenterology) [name], MD as Consulting Physician (Urology) [name], Man X, NP as Nurse Practitioner (Internal Medicine)  Extended Emergency Contact Information Primary Emergency Contact: [facility] Address: [address]          [place], [place] [address] [name] of [place] Phone: [phone] Mobile Phone: [phone] Relation: Spouse Secondary Emergency Contact: [name] Mobile Phone: [phone] Relation: Daughter Preferred language: [name] Interpreter needed? No  Code Status:   Full Code Goals of care: Advanced Directive information Advanced Directives 05/02/2021  Does Patient Have a Medical Advance Directive? Yes  Type of Advance Directive Living will;Out of facility DNR (pink MOST or yellow form)  Does patient want to make changes to medical advance directive? No - Patient declined  Copy of [place] in Chart? -  Would patient like information on creating a medical advance directive? -  Pre-existing out of facility DNR order (yellow form or pink MOST form) Yellow form placed in chart (order not valid for inpatient use)     Chief Complaint  Patient presents with  . Medical Management of Chronic Issues    HPI:  Pt is a 87 y.o. female seen today for medical management of chronic diseases.   Family Meeting with her Husband in the Room  Patient has  History of renal stones with recurrent UTI Follows very closely with urology.  Has not had any new pain or hematuria recently.  Also doing well on Keflex History of  erosive arthritis on Plaquenil and follows with Dr. Lenna Gilford Parkinson disease with hallucinations Severe AS refused surgery per cardiology palliative approach Also has h/o Peripheral Neuropathy, hypothyroid, GERD, osteoporosis, hyperlipidemia, gout,  Had fall recently.  States she sometimes feels lightheaded and feels like her legs are going to give away and she has to sit down Denies any signs of vertigo. Patient sustained rib fracture 7and 8 th Rib Pain seems to be controlled on tramadol.  Patient also had started getting therapy Her husband wanted to  discussed all her meds. Continues to have some pain in her great toe bilateral.  Follows with podiatry Also continues to have visual hallucinations  Weight is stable Past Medical History:  Diagnosis Date  . Abnormal nuclear stress test   . Actinic cheilitis 04/23/2019  . Anemia    iron deficiency   . Anxiety   . Aortic stenosis    severe by echo 2021   . Arthritis   . Broken arm    right   . Chronic diastolic CHF (congestive heart failure) ([place])   . Chronic pain   . Cognitive communication deficit   . Constipation   . Depression   . Erosive (osteo)arthritis 03/09/2020  . Family history of adverse reaction to anesthesia    pt. states sister vomits  . Fibromyalgia   . GERD (gastroesophageal reflux disease)   . H/O hiatal hernia   . Hallucinations, visual 07/21/2018   03/10/20 psych consult.   . Hematuria 05/18/2020  . History of bronchitis   . History of kidney stones   . Hypercholesterolemia   . Hypertension    dr t  turner  . Hypothyroidism   . Memory loss 04/21/2018   08/08/18 CT head no traumatic findings, atrophy.  09/27/19 MMSE 25/30, failed the clock drawing  . Microhematuria 03/24/2015  . Neuropathy   . Osteoporosis   . Parkinson disease ([place])   . PVD (peripheral vascular disease) ([facility])    99% stenosis of left anteiror tibial artery, mod stenosis of left distal SFA and popliteal artery followed by [name]  . RBBB     noted on EKG 2018  . Restless leg syndrome   . Sepsis ([place])    hx of due to [facility]  . Septic shock ([place]) 04/19/2020  . Shortness of breath    with exertion - chronic   . Sjogren's disease ([place])   . Sjogren's syndrome [facility]) 02/04/2018   04/16/19 rheumatology: Erosive OA of hands, Sjogrens syndrome, low back pain at multiple sites, recurrent kidney stones, age related osteoporosis w/o current pathological fracture, f/u 6 months.   . SOB (shortness of breath)    chronic due to diastolic dysfunction, deconditioning, obesity  . Spinal stenosis    lumbar region   . Tremor   . Unstable gait 04/21/2018  . Unsteadiness on feet   . Urinary frequency 09/18/2018  . Urinary tract infection   . Visual hallucinations    Past Surgical History:  Procedure Laterality Date  . ABDOMINAL HYSTERECTOMY    . BACK SURGERY     4 back surgeries,   lumbar fusion  . CARDIAC CATHETERIZATION  2006   normal  . CYSTOSCOPY/URETEROSCOPY/HOLMIUM LASER/STENT PLACEMENT Left 01/06/2019   Procedure: CYSTOSCOPY/RETROGRADE/URETEROSCOPY/HOLMIUM LASER/BASKET RETRIEVAL/STENT PLACEMENT;  Surgeon: Ceasar Mons, MD;  Location: [facility];  Service: Urology;  Laterality: Left;  . CYSTOSCOPY/URETEROSCOPY/HOLMIUM LASER/STENT PLACEMENT Bilateral 05/24/2020   Procedure: CYSTOSCOPY/RETROGRADE/URETEROSCOPY/HOLMIUM LASER/STENT PLACEMENT;  Surgeon: Ceasar Mons, MD;  Location: [facility];  Service: Urology;  Laterality: Bilateral;  . EYE SURGERY Bilateral    cateracts  . falls     variious fall, broken wrist,and toes  . FRACTURE SURGERY Right April 2016   Wrist, Pt. fell  . IR NEPHROSTOMY PLACEMENT RIGHT  04/19/2020  . kidney stone removal Left 01/20/2019  . RIGHT/LEFT HEART CATH AND CORONARY ANGIOGRAPHY N/A 02/05/2018   Procedure: RIGHT/LEFT HEART CATH AND CORONARY ANGIOGRAPHY;  Surgeon: Troy Sine, MD;  Location: [facility];  Service: Cardiovascular;  Laterality: N/A;  . SPINAL CORD STIMULATOR INSERTION N/A  09/09/2015   Procedure: LUMBAR SPINAL CORD STIMULATOR INSERTION;  Surgeon: Clydell Hakim, MD;  Location: [facility];  Service: Neurosurgery;  Laterality: N/A;  LUMBAR SPINAL CORD STIMULATOR INSERTION  . SPINE SURGERY  April 2013   Back X's 4  . TOTAL HIP ARTHROPLASTY     right  . WRIST FRACTURE SURGERY Bilateral     Allergies  Allergen Reactions  . Adrenalone   . Lactose Other (See Comments)    abd pain, lactose intolerant  . Morphine Other (See Comments)  . Sulfa Antibiotics Other (See Comments)    Headache, very sick  . Codeine Other (See Comments)    Reaction:  Headaches and nightmares   . Lactose Intolerance (Gi) Nausea And Vomiting  . Latex Rash  . Lyrica [Pregabalin] Swelling and Other (See Comments)    Reaction:  Leg swelling  . Other Other (See Comments)    Pt states that pain medications give her nightmares.    . Plaquenil [Hydroxychloroquine Sulfate] Other (See Comments)    Reaction:  GI upset   . Reglan [Metoclopramide] Other (See  Comments)    Reaction:  GI upset   . Requip [Ropinirole Hcl] Other (See Comments)    Reaction:  GI upset   . Septra [Sulfamethoxazole-Trimethoprim] Nausea And Vomiting  . Shellfish Allergy Nausea And Vomiting    Allergies as of 05/02/2021      Reactions   Adrenalone    Lactose Other (See Comments)   abd pain, lactose intolerant   Morphine Other (See Comments)   Sulfa Antibiotics Other (See Comments)   Headache, very sick   Codeine Other (See Comments)   Reaction:  Headaches and nightmares    Lactose Intolerance (gi) Nausea And Vomiting   Latex Rash   Lyrica [pregabalin] Swelling, Other (See Comments)   Reaction:  Leg swelling   Other Other (See Comments)   Pt states that pain medications give her nightmares.     Plaquenil [hydroxychloroquine Sulfate] Other (See Comments)   Reaction:  GI upset    Reglan [metoclopramide] Other (See Comments)   Reaction:  GI upset    Requip [ropinirole Hcl] Other (See Comments)   Reaction:  GI  upset    Septra [sulfamethoxazole-trimethoprim] Nausea And Vomiting   Shellfish Allergy Nausea And Vomiting      Medication List       Accurate as of May 02, 2021  3:01 PM. If you have any questions, ask your nurse or doctor.        acetaminophen 500 MG tablet Commonly known as: TYLENOL Take 500 mg by mouth 3 (three) times daily as needed for mild pain. not to exceed 3 gm in 24 hours. As needed for arthritis pain   AeroChamber MV inhaler by Other route. Use as instructed as needed   albuterol 108 (90 Base) MCG/ACT inhaler Commonly known as: VENTOLIN HFA Inhale 2 puffs into the lungs every 6 (six) hours as needed for wheezing or shortness of breath.   aspirin EC 81 MG tablet Take 81 mg by mouth daily.   Calcium 600/Vitamin D3 600-800 MG-UNIT Tabs Generic drug: Calcium Carb-Cholecalciferol Take 1 tablet by mouth daily.   carbidopa-levodopa 25-100 MG tablet Commonly known as: SINEMET IR Take 2 tablets by mouth in the morning and at bedtime.   cephALEXin 250 MG capsule Commonly known as: KEFLEX Take 250 mg by mouth daily.   docusate sodium 100 MG capsule Commonly known as: COLACE Take 100 mg by mouth at bedtime.   eucerin cream Apply 1 application topically daily.   famotidine 20 MG tablet Commonly known as: PEPCID Take 20 mg by mouth daily.   furosemide 20 MG tablet Commonly known as: LASIX Take 20 mg by mouth daily as needed.   furosemide 20 MG tablet Commonly known as: LASIX Take 20 mg by mouth daily.   hydroxychloroquine 200 MG tablet Commonly known as: PLAQUENIL Take 200 mg by mouth daily.   lactose free nutrition Liqd Take 237 mLs by mouth as needed.   levothyroxine 50 MCG tablet Commonly known as: SYNTHROID Take 50 mcg by mouth daily before breakfast.   lubiprostone 24 MCG capsule Commonly known as: AMITIZA Take 24 mcg by mouth 2 (two) times daily with a meal.   MULTIPLE MINERALS-VITAMINS PO Take 1 tablet by mouth daily.   nystatin  powder Commonly known as: MYCOSTATIN/NYSTOP Apply topically 2 (two) times daily as needed.   pantoprazole 40 MG tablet Commonly known as: PROTONIX Take 40 mg by mouth daily.   potassium citrate 10 MEQ (1080 MG) SR tablet Commonly known as: UROCIT-K Take 10 mEq by mouth 2 (  two) times daily.   pramipexole 1 MG tablet Commonly known as: MIRAPEX Take 1 mg by mouth 2 (two) times daily.   PRESERVISION AREDS 2 PO Take 1 tablet by mouth daily.   QUEtiapine 25 MG tablet Commonly known as: SEROQUEL Take 25 mg by mouth daily.   rosuvastatin 20 MG tablet Commonly known as: CRESTOR Take 10 mg by mouth daily.   saccharomyces boulardii 250 MG capsule Commonly known as: FLORASTOR Take 250 mg by mouth daily.   senna-docusate 8.6-50 MG tablet Commonly known as: Senokot-S Take 1 tablet by mouth at bedtime.   sertraline 50 MG tablet Commonly known as: ZOLOFT Take 75 mg by mouth at bedtime. 1-1/2 tablets to = 75 mg   Slow Fe 142 (45 Fe) MG Tbcr Generic drug: Ferrous Sulfate Take 1 tablet by mouth. Daily   Systane 0.4-0.3 % Soln Generic drug: Polyethyl Glycol-Propyl Glycol Apply 1 drop to eye 2 (two) times daily as needed (dry eyes).   traMADol 50 MG tablet Commonly known as: ULTRAM Take 1 tablet (50 mg total) by mouth 3 (three) times daily.   Vitamin D 50 MCG (2000 UT) tablet Take 2,000 Units by mouth daily.       Review of Systems  Constitutional: Positive for activity change.  HENT: Negative.   Respiratory: Positive for shortness of breath.   Cardiovascular: Positive for leg swelling.  Gastrointestinal: Positive for constipation.  Genitourinary: Negative.   Musculoskeletal: Positive for gait problem.  Skin: Negative.   Neurological: Positive for light-headedness.  Psychiatric/Behavioral: Positive for confusion and hallucinations.    Immunization History  Administered Date(s) Administered  . Hepatitis A, Adult 03/01/1997, 09/20/1997  . Influenza Split 09/09/2013,  09/27/2014  . Influenza, High Dose Seasonal PF 08/01/2011, 08/27/2012, 10/14/2013, 09/08/2015, 09/10/2016, 09/12/2017, 09/12/2018, 09/15/2019  . Influenza,inj,Quad PF,6+ Mos 09/11/2016  . Influenza-Unspecified 09/21/2020  . Moderna Sars-Covid-2 Vaccination 12/12/2019, 01/09/2020, 10/18/2020  . Pneumococcal Conjugate-13 04/15/2014  . Pneumococcal Polysaccharide-23 09/28/2005  . Td 06/20/2016  . Tdap 04/29/2006, 09/01/2015  . Zoster 04/29/2006  . Zoster Recombinat (Shingrix) 06/11/2018, 09/10/2018   Pertinent  Health Maintenance Due  Topic Date Due  . INFLUENZA VACCINE  07/10/2021  . DEXA SCAN  Completed  . PNA vac Low Risk Adult  Completed   Fall Risk  11/16/2019 04/23/2019 04/09/2019 10/29/2018 10/27/2018  Falls in the past year? 0 0 0 0 1  Comment - - - fell June 2019 -  Number falls in past yr: - 0 0 - 0  Injury with Fall? - 0 0 - 1  Comment - - - - -  Risk for fall due to : - - - - -  Risk for fall due to: Comment - - - - -   Functional Status Survey:    Vitals:   05/02/21 1443  BP: 139/86  Pulse: 84  Temp: (!) 96.3 F (35.7 C)  Weight: 141 lb 1.6 oz (64 kg)  Height: 5\' 2"  (1.575 m)   Body mass index is 25.81 kg/m. Physical Exam Constitutional: Oriented to person, place, and time. Well-developed and well-nourished.  HENT:  Head: Normocephalic.  Mouth/Throat: Oropharynx is clear and moist.  Eyes: Pupils are equal, round, and reactive to light.  Neck: Neck supple.  Cardiovascular: Normal rate and normal heart sounds.  Murmur Present Pulmonary/Chest: Effort normal and breath sounds normal. No respiratory distress. No wheezes. She has no rales.  Abdominal: Soft. Bowel sounds are normal. No distension. There is no tenderness. There is no rebound.  Musculoskeletal:MilD edema Bilateral  Left more then Right Lymphadenopathy: none Neurological: Alert and oriented to person, place, and time.  Skin: Skin is warm and dry.  Psychiatric: Normal mood and affect. Behavior is  normal. Thought content normal.   Labs reviewed: Recent Labs    05/24/20 1015 08/04/20 0000 10/05/20 0000 11/22/20 1109 04/12/21 0005  NA 141   < > 142 141 140  K 3.5   < > 4.2 4.2 4.2  CL 107   < > 107 104 104  CO2 24   < > 25* 22 25*  GLUCOSE 100*  --   --  104*  --   BUN 12   < > 17 17 18   CREATININE 0.81   < > 0.9 1.01* 0.9  CALCIUM 8.9   < > 9.6 10.0 9.8   < > = values in this interval not displayed.   Recent Labs    10/05/20 0000 04/12/21 0005  AST 19 19  ALT 15 8  ALKPHOS 62 78  ALBUMIN 3.5 4.1   Recent Labs    05/24/20 1015 06/21/20 0000 08/04/20 0000 10/05/20 0000 04/12/21 0005  WBC 7.8   < > 6.2 4.8 6.7  NEUTROABS  --    < > 3,677 2,328.00 4,147.00  HGB 9.8*   < > 8.9* 9.6* 11.0*  HCT 30.9*   < > 28* 30* 34*  MCV 98.4  --   --   --   --   PLT 286   < > 354 288 282   < > = values in this interval not displayed.   Lab Results  Component Value Date   TSH 1.62 04/12/2021   No results found for: HGBA1C Lab Results  Component Value Date   CHOL 105 10/05/2020   HDL 46 10/05/2020   LDLCALC 38 10/05/2020   TRIG 119 10/05/2020   CHOLHDL 2.4 07/14/2019    Significant Diagnostic Results in last 30 days:  No results found.  Assessment/Plan History of recent fall Also Lightheadedness Check For Orthostatic Working with therapy Labs are good Closed fracture of multiple ribs of left side with routine healing, subsequent encounter On Tramadol Will change the dose to 25 mg TID after 2 weeks Parkinson's disease ([place]) On Sinemet Follows with Neurology PRN  Depression, psychotic ([place]) On Seroquel and Zoloft  Chronic constipation On AMitiza Hypothyroidism, unspecified type TSH normal in 5/22 Erosive (osteo)arthritis On Plaquenil follows with Dr Posey Rea Chronic diastolic CHF (congestive heart failure) (H[facility]) On Low dose of lasix Iron deficiency anemia secondary to inadequate dietary iron intake On Iron Hgb stable Nonrheumatic aortic valve  stenosis Per Cardiology Palliative approach Has SOB on exertion Stage 3a chronic kidney disease ([place]) Creat stable Recurrent UTI On Keflex   Family/ staff Communication: Discussed with Husband  Labs/tests ordered:    Total time spent in this patient care encounter was  _45  minutes; greater than 50% of the visit spent counseling patient and staff, reviewing records , Labs and coordinating care for problems addressed at this encounter.

## 2021-05-02 NOTE — Telephone Encounter (Signed)
Received refill Request from [facility].  Pended Rx and sent to Amy for approval.

## 2021-05-03 DIAGNOSIS — M25522 Pain in left elbow: Secondary | ICD-10-CM | POA: Diagnosis not present

## 2021-05-03 DIAGNOSIS — R29898 Other symptoms and signs involving the musculoskeletal system: Secondary | ICD-10-CM | POA: Diagnosis not present

## 2021-05-03 DIAGNOSIS — M6281 Muscle weakness (generalized): Secondary | ICD-10-CM | POA: Diagnosis not present

## 2021-05-03 DIAGNOSIS — M25512 Pain in left shoulder: Secondary | ICD-10-CM | POA: Diagnosis not present

## 2021-05-03 DIAGNOSIS — M79672 Pain in left foot: Secondary | ICD-10-CM | POA: Diagnosis not present

## 2021-05-03 DIAGNOSIS — M25672 Stiffness of left ankle, not elsewhere classified: Secondary | ICD-10-CM | POA: Diagnosis not present

## 2021-05-05 ENCOUNTER — Encounter: Payer: Self-pay | Admitting: Nurse Practitioner

## 2021-05-05 DIAGNOSIS — M25522 Pain in left elbow: Secondary | ICD-10-CM | POA: Diagnosis not present

## 2021-05-05 DIAGNOSIS — I951 Orthostatic hypotension: Secondary | ICD-10-CM | POA: Insufficient documentation

## 2021-05-05 DIAGNOSIS — M79672 Pain in left foot: Secondary | ICD-10-CM | POA: Diagnosis not present

## 2021-05-05 DIAGNOSIS — M25672 Stiffness of left ankle, not elsewhere classified: Secondary | ICD-10-CM | POA: Diagnosis not present

## 2021-05-05 DIAGNOSIS — M25512 Pain in left shoulder: Secondary | ICD-10-CM | POA: Diagnosis not present

## 2021-05-05 DIAGNOSIS — R29898 Other symptoms and signs involving the musculoskeletal system: Secondary | ICD-10-CM | POA: Diagnosis not present

## 2021-05-05 DIAGNOSIS — M6281 Muscle weakness (generalized): Secondary | ICD-10-CM | POA: Diagnosis not present

## 2021-05-09 DIAGNOSIS — M79672 Pain in left foot: Secondary | ICD-10-CM | POA: Diagnosis not present

## 2021-05-09 DIAGNOSIS — M25512 Pain in left shoulder: Secondary | ICD-10-CM | POA: Diagnosis not present

## 2021-05-09 DIAGNOSIS — Z23 Encounter for immunization: Secondary | ICD-10-CM | POA: Diagnosis not present

## 2021-05-09 DIAGNOSIS — M25522 Pain in left elbow: Secondary | ICD-10-CM | POA: Diagnosis not present

## 2021-05-09 DIAGNOSIS — M6281 Muscle weakness (generalized): Secondary | ICD-10-CM | POA: Diagnosis not present

## 2021-05-09 DIAGNOSIS — M25672 Stiffness of left ankle, not elsewhere classified: Secondary | ICD-10-CM | POA: Diagnosis not present

## 2021-05-09 DIAGNOSIS — R29898 Other symptoms and signs involving the musculoskeletal system: Secondary | ICD-10-CM | POA: Diagnosis not present

## 2021-05-10 DIAGNOSIS — M79672 Pain in left foot: Secondary | ICD-10-CM | POA: Diagnosis not present

## 2021-05-10 DIAGNOSIS — M6281 Muscle weakness (generalized): Secondary | ICD-10-CM | POA: Diagnosis not present

## 2021-05-10 DIAGNOSIS — R2681 Unsteadiness on feet: Secondary | ICD-10-CM | POA: Diagnosis not present

## 2021-05-10 DIAGNOSIS — M25522 Pain in left elbow: Secondary | ICD-10-CM | POA: Diagnosis not present

## 2021-05-10 DIAGNOSIS — M722 Plantar fascial fibromatosis: Secondary | ICD-10-CM | POA: Diagnosis not present

## 2021-05-10 DIAGNOSIS — M25672 Stiffness of left ankle, not elsewhere classified: Secondary | ICD-10-CM | POA: Diagnosis not present

## 2021-05-10 DIAGNOSIS — M25512 Pain in left shoulder: Secondary | ICD-10-CM | POA: Diagnosis not present

## 2021-05-10 DIAGNOSIS — R29898 Other symptoms and signs involving the musculoskeletal system: Secondary | ICD-10-CM | POA: Diagnosis not present

## 2021-05-11 DIAGNOSIS — M25522 Pain in left elbow: Secondary | ICD-10-CM | POA: Diagnosis not present

## 2021-05-11 DIAGNOSIS — R2681 Unsteadiness on feet: Secondary | ICD-10-CM | POA: Diagnosis not present

## 2021-05-11 DIAGNOSIS — R29898 Other symptoms and signs involving the musculoskeletal system: Secondary | ICD-10-CM | POA: Diagnosis not present

## 2021-05-11 DIAGNOSIS — M79672 Pain in left foot: Secondary | ICD-10-CM | POA: Diagnosis not present

## 2021-05-11 DIAGNOSIS — M25512 Pain in left shoulder: Secondary | ICD-10-CM | POA: Diagnosis not present

## 2021-05-11 DIAGNOSIS — M6281 Muscle weakness (generalized): Secondary | ICD-10-CM | POA: Diagnosis not present

## 2021-05-12 ENCOUNTER — Encounter: Payer: Self-pay | Admitting: Nurse Practitioner

## 2021-05-12 ENCOUNTER — Non-Acute Institutional Stay (SKILLED_NURSING_FACILITY): Payer: Medicare Other | Admitting: Nurse Practitioner

## 2021-05-12 DIAGNOSIS — N390 Urinary tract infection, site not specified: Secondary | ICD-10-CM

## 2021-05-12 DIAGNOSIS — M154 Erosive (osteo)arthritis: Secondary | ICD-10-CM

## 2021-05-12 DIAGNOSIS — M6281 Muscle weakness (generalized): Secondary | ICD-10-CM | POA: Diagnosis not present

## 2021-05-12 DIAGNOSIS — I350 Nonrheumatic aortic (valve) stenosis: Secondary | ICD-10-CM | POA: Diagnosis not present

## 2021-05-12 DIAGNOSIS — F323 Major depressive disorder, single episode, severe with psychotic features: Secondary | ICD-10-CM

## 2021-05-12 DIAGNOSIS — M79672 Pain in left foot: Secondary | ICD-10-CM | POA: Diagnosis not present

## 2021-05-12 DIAGNOSIS — R2681 Unsteadiness on feet: Secondary | ICD-10-CM | POA: Diagnosis not present

## 2021-05-12 DIAGNOSIS — K219 Gastro-esophageal reflux disease without esophagitis: Secondary | ICD-10-CM

## 2021-05-12 DIAGNOSIS — S42302D Unspecified fracture of shaft of humerus, left arm, subsequent encounter for fracture with routine healing: Secondary | ICD-10-CM | POA: Diagnosis not present

## 2021-05-12 DIAGNOSIS — E039 Hypothyroidism, unspecified: Secondary | ICD-10-CM

## 2021-05-12 DIAGNOSIS — K5909 Other constipation: Secondary | ICD-10-CM

## 2021-05-12 DIAGNOSIS — S2249XD Multiple fractures of ribs, unspecified side, subsequent encounter for fracture with routine healing: Secondary | ICD-10-CM | POA: Diagnosis not present

## 2021-05-12 DIAGNOSIS — I5032 Chronic diastolic (congestive) heart failure: Secondary | ICD-10-CM

## 2021-05-12 DIAGNOSIS — D508 Other iron deficiency anemias: Secondary | ICD-10-CM | POA: Diagnosis not present

## 2021-05-12 DIAGNOSIS — M25512 Pain in left shoulder: Secondary | ICD-10-CM | POA: Diagnosis not present

## 2021-05-12 DIAGNOSIS — G20 Parkinson's disease: Secondary | ICD-10-CM | POA: Diagnosis not present

## 2021-05-12 DIAGNOSIS — R441 Visual hallucinations: Secondary | ICD-10-CM

## 2021-05-12 DIAGNOSIS — R29898 Other symptoms and signs involving the musculoskeletal system: Secondary | ICD-10-CM | POA: Diagnosis not present

## 2021-05-12 DIAGNOSIS — M25522 Pain in left elbow: Secondary | ICD-10-CM | POA: Diagnosis not present

## 2021-05-12 NOTE — Assessment & Plan Note (Signed)
Constipation, takes Senokot S I qd, Amitiza 60mcgbid, Colace qd

## 2021-05-12 NOTE — Assessment & Plan Note (Signed)
AS Cardiology palliative approach, see Cardiology next Monday.

## 2021-05-12 NOTE — Assessment & Plan Note (Signed)
takesPantoprazole 40mg  qd, Famotidine 20mg  qd.yearly FOBT per GI

## 2021-05-12 NOTE — Progress Notes (Addendum)
Location:   SNF [place] [facility] Room Number: J681 Place of Service:  SNF (31) Provider: Lennie Odor Vestal Crandall NP  [name], MD  Patient Care Team: [name], MD as PCP - General (Internal Medicine) [name], MD as PCP - Cardiology (Cardiology) [name], MD (Neurosurgery) [name], MD (Neurology) [name], MD (Inactive) as Consulting Physician (Gastroenterology) [name], MD as Consulting Physician (Urology) Jeanine Caven X, NP as Nurse Practitioner (Internal Medicine)  Extended Emergency Contact Information Primary Emergency Contact: [facility] Address: [address]          [place], [place] [address] [name] Phone: [phone] Mobile Phone: [phone] Relation: Spouse Secondary Emergency Contact: [name] Mobile Phone: [phone] Relation: Daughter Preferred language: [name] Interpreter needed? No  Code Status: DNR Goals of care: Advanced Directive information Advanced Directives 05/12/2021  Does Patient Have a Medical Advance Directive? Yes  Type of Advance Directive Living will  Does patient want to make changes to medical advance directive? No - Patient declined  Copy of [place] in Chart? -  Would patient like information on creating a medical advance directive? -  Pre-existing out of facility DNR order (yellow form or pink MOST form) Yellow form placed in chart (order not valid for inpatient use)     Chief Complaint  Patient presents with  . Acute Visit    Patient being seen for anxiety and hallucinations.     HPI:  Pt is a 87 y.o. female seen today for an acute visit for reported the patient's increased anxiety, calling her husband early in am, more frequent visual hallucination-saw 2 dogs came into her room early today, also saw 4 people with pills showed up in her room. The patient stated she had 3 black stools today vs some other times? Her husband thinks  her stool was normal. The patient denied indigestion, heartburns, abd pain, nausea, vomiting, constipation, diarrhea.    Closed fracture of multiple ribs of left side with routine healing, takes Tramadol, pain is better controlled.   Hx of rena lithiasis, recurrent UTI, urosepsis, takes Keflex for suppression therapy, f/u Urology GERD, takesPantoprazole 44m qd, Famotidine 286mqd.yearly FOBT per GI Parkinson's, takes MiraPex 17m72mid, Sinemet 25/100 II bid.  Depression/hallucination, takes Sertraline 81m4m, Quetiapineqd Constipation, takes Senokot S I qd, Amitiza 24mc30m, Colace qd Hypothyroidism, takes Levothyroxine 50mcg3mTSH 1.62 04/11/21 Erosive arthritis, takes Plaquenil 200mg q417mylenol 500mg bi54m/u Rheumatology. CHF/trace edema BLE, takes Furosemide 20mg qd,76mdiology, severe aortic valve stenosis.Bun/creat18/0.89 04/11/21, Echocardiogram 11/15/20 EF 60-65% Anemia, takes Fe, Hgb 11 04/11/21 AS Cardiology palliative approach   Past Medical History:  Diagnosis Date  . Abnormal nuclear stress test   . Actinic cheilitis 04/23/2019  . Anemia    iron deficiency   . Anxiety   . Aortic stenosis    severe by echo 2021   . Arthritis   . Broken arm    right   . Chronic diastolic CHF (congestive heart failure) (HCC)   . [place] pain   . Cognitive communication deficit   . Constipation   . Depression   . Erosive (osteo)arthritis 03/09/2020  . Family history of adverse reaction to anesthesia    pt. states sister vomits  . Fibromyalgia   . GERD (gastroesophageal reflux disease)   . H/O hiatal hernia   . Hallucinations, visual 07/21/2018   03/10/20 psych consult.   . Hematuria 05/18/2020  . History of bronchitis   .  History of kidney stones   . Hypercholesterolemia   . Hypertension    dr t turner  . Hypothyroidism   . Memory loss 04/21/2018   08/08/18 CT head no traumatic  findings, atrophy.  09/27/19 MMSE 25/30, failed the clock drawing  . Microhematuria 03/24/2015  . Neuropathy   . Osteoporosis   . Parkinson disease ([place])   . PVD (peripheral vascular disease) (HCC)    99% stenosis of left anteiror tibial artery, mod stenosis of left distal SFA and popliteal artery followed by [name]  . RBBB    noted on EKG 2018  . Restless leg syndrome   . Sepsis ([place])    hx of due to [facility]  . Septic shock ([place]) 04/19/2020  . Shortness of breath    with exertion - chronic   . Sjogren's disease ([place])   . Sjogren's syndrome [facility]) 02/04/2018   04/16/19 rheumatology: Erosive OA of hands, Sjogrens syndrome, low back pain at multiple sites, recurrent kidney stones, age related osteoporosis w/o current pathological fracture, f/u 6 months.   . SOB (shortness of breath)    chronic due to diastolic dysfunction, deconditioning, obesity  . Spinal stenosis    lumbar region   . Tremor   . Unstable gait 04/21/2018  . Unsteadiness on feet   . Urinary frequency 09/18/2018  . Urinary tract infection   . Visual hallucinations    Past Surgical History:  Procedure Laterality Date  . ABDOMINAL HYSTERECTOMY    . BACK SURGERY     4 back surgeries,   lumbar fusion  . CARDIAC CATHETERIZATION  2006   normal  . CYSTOSCOPY/URETEROSCOPY/HOLMIUM LASER/STENT PLACEMENT Left 01/06/2019   Procedure: CYSTOSCOPY/RETROGRADE/URETEROSCOPY/HOLMIUM LASER/BASKET RETRIEVAL/STENT PLACEMENT;  Surgeon: Ceasar Mons, MD;  Location: [facility];  Service: Urology;  Laterality: Left;  . CYSTOSCOPY/URETEROSCOPY/HOLMIUM LASER/STENT PLACEMENT Bilateral 05/24/2020   Procedure: CYSTOSCOPY/RETROGRADE/URETEROSCOPY/HOLMIUM LASER/STENT PLACEMENT;  Surgeon: Ceasar Mons, MD;  Location: [facility];  Service: Urology;  Laterality: Bilateral;  . EYE SURGERY Bilateral    cateracts  . falls     variious fall, broken wrist,and toes  . FRACTURE SURGERY Right April 2016   Wrist, Pt. fell  . IR NEPHROSTOMY  PLACEMENT RIGHT  04/19/2020  . kidney stone removal Left 01/20/2019  . RIGHT/LEFT HEART CATH AND CORONARY ANGIOGRAPHY N/A 02/05/2018   Procedure: RIGHT/LEFT HEART CATH AND CORONARY ANGIOGRAPHY;  Surgeon: Troy Sine, MD;  Location: [place];  Service: Cardiovascular;  Laterality: N/A;  . SPINAL CORD STIMULATOR INSERTION N/A 09/09/2015   Procedure: LUMBAR SPINAL CORD STIMULATOR INSERTION;  Surgeon: Clydell Hakim, MD;  Location: [place] [facility];  Service: Neurosurgery;  Laterality: N/A;  LUMBAR SPINAL CORD STIMULATOR INSERTION  . SPINE SURGERY  April 2013   Back X's 4  . TOTAL HIP ARTHROPLASTY     right  . WRIST FRACTURE SURGERY Bilateral     Allergies  Allergen Reactions  . Adrenalone   . Lactose Other (See Comments)    abd pain, lactose intolerant  . Morphine Other (See Comments)  . Sulfa Antibiotics Other (See Comments)    Headache, very sick  . Codeine Other (See Comments)    Reaction:  Headaches and nightmares   . Lactose Intolerance (Gi) Nausea And Vomiting  . Latex Rash  . Lyrica [Pregabalin] Swelling and Other (See Comments)    Reaction:  Leg swelling  . Other Other (See Comments)    Pt states that pain medications give her nightmares.    . Plaquenil [Hydroxychloroquine Sulfate]  Other (See Comments)    Reaction:  GI upset   . Reglan [Metoclopramide] Other (See Comments)    Reaction:  GI upset   . Requip [Ropinirole Hcl] Other (See Comments)    Reaction:  GI upset   . Septra [Sulfamethoxazole-Trimethoprim] Nausea And Vomiting  . Shellfish Allergy Nausea And Vomiting    Allergies as of 05/12/2021      Reactions   Adrenalone    Lactose Other (See Comments)   abd pain, lactose intolerant   Morphine Other (See Comments)   Sulfa Antibiotics Other (See Comments)   Headache, very sick   Codeine Other (See Comments)   Reaction:  Headaches and nightmares    Lactose Intolerance (gi) Nausea And Vomiting   Latex Rash   Lyrica [pregabalin] Swelling, Other (See  Comments)   Reaction:  Leg swelling   Other Other (See Comments)   Pt states that pain medications give her nightmares.     Plaquenil [hydroxychloroquine Sulfate] Other (See Comments)   Reaction:  GI upset    Reglan [metoclopramide] Other (See Comments)   Reaction:  GI upset    Requip [ropinirole Hcl] Other (See Comments)   Reaction:  GI upset    Septra [sulfamethoxazole-trimethoprim] Nausea And Vomiting   Shellfish Allergy Nausea And Vomiting      Medication List       Accurate as of May 12, 2021 11:59 PM. If you have any questions, ask your nurse or doctor.        STOP taking these medications   albuterol 108 (90 Base) MCG/ACT inhaler Commonly known as: VENTOLIN HFA Stopped by: Quinto Tippy X Edu On, NP   rosuvastatin 20 MG tablet Commonly known as: CRESTOR Stopped by: Amilya Haver X Frantz Quattrone, NP     TAKE these medications   acetaminophen 500 MG tablet Commonly known as: TYLENOL Take 500 mg by mouth 3 (three) times daily as needed for mild pain. not to exceed 3 gm in 24 hours. As needed for arthritis pain   AeroChamber MV inhaler by Other route. Use as instructed as needed   aspirin EC 81 MG tablet Take 81 mg by mouth daily.   Calcium 600/Vitamin D3 600-800 MG-UNIT Tabs Generic drug: Calcium Carb-Cholecalciferol Take 1 tablet by mouth daily.   carbidopa-levodopa 25-100 MG tablet Commonly known as: SINEMET IR Take 2 tablets by mouth in the morning and at bedtime.   cephALEXin 250 MG capsule Commonly known as: KEFLEX Take 250 mg by mouth daily.   docusate sodium 100 MG capsule Commonly known as: COLACE Take 100 mg by mouth at bedtime.   eucerin cream Apply 1 application topically daily.   famotidine 20 MG tablet Commonly known as: PEPCID Take 20 mg by mouth daily.   furosemide 20 MG tablet Commonly known as: LASIX Take 20 mg by mouth daily as needed.   furosemide 20 MG tablet Commonly known as: LASIX Take 20 mg by mouth daily.   hydroxychloroquine 200 MG  tablet Commonly known as: PLAQUENIL Take 200 mg by mouth daily.   lactose free nutrition Liqd Take 237 mLs by mouth as needed.   levothyroxine 50 MCG tablet Commonly known as: SYNTHROID Take 50 mcg by mouth daily before breakfast.   lubiprostone 24 MCG capsule Commonly known as: AMITIZA Take 24 mcg by mouth 2 (two) times daily with a meal.   MULTIPLE MINERALS-VITAMINS PO Take 1 tablet by mouth daily.   nystatin powder Commonly known as: MYCOSTATIN/NYSTOP Apply topically 2 (two) times daily as needed.   pantoprazole  40 MG tablet Commonly known as: PROTONIX Take 40 mg by mouth daily.   potassium citrate 10 MEQ (1080 MG) SR tablet Commonly known as: UROCIT-K Take 10 mEq by mouth 2 (two) times daily.   pramipexole 1 MG tablet Commonly known as: MIRAPEX Take 1 mg by mouth 2 (two) times daily.   PRESERVISION AREDS 2 PO Take 1 tablet by mouth daily.   QUEtiapine 25 MG tablet Commonly known as: SEROQUEL Take 25 mg by mouth daily.   saccharomyces boulardii 250 MG capsule Commonly known as: FLORASTOR Take 250 mg by mouth daily.   senna-docusate 8.6-50 MG tablet Commonly known as: Senokot-S Take 1 tablet by mouth at bedtime.   sertraline 50 MG tablet Commonly known as: ZOLOFT Take 75 mg by mouth at bedtime. 1-1/2 tablets to = 75 mg   Slow Fe 142 (45 Fe) MG Tbcr Generic drug: Ferrous Sulfate Take 1 tablet by mouth. Daily   Systane 0.4-0.3 % Soln Generic drug: Polyethyl Glycol-Propyl Glycol Apply 1 drop to eye 2 (two) times daily as needed (dry eyes).   traMADol 50 MG tablet Commonly known as: ULTRAM Take 25 mg by mouth 3 (three) times daily.   traMADol 50 MG tablet Commonly known as: ULTRAM Take 1 tablet (50 mg total) by mouth 3 (three) times daily.   Vitamin D 50 MCG (2000 UT) tablet Take 2,000 Units by mouth daily.       Review of Systems  Constitutional: Negative for fatigue, fever and unexpected weight change.  HENT: Positive for hearing loss.  Negative for congestion and voice change.   Eyes: Negative for visual disturbance.  Respiratory: Positive for shortness of breath. Negative for cough.        Chronic DOE  Cardiovascular: Positive for leg swelling.  Gastrointestinal: Negative for abdominal pain and constipation.       ?black stools.   Genitourinary: Negative for dysuria, hematuria and urgency.  Musculoskeletal: Positive for arthralgias, back pain, gait problem and myalgias.       Chronic lower back pain. The R 3rd finger mid knuckle pain is chronic.  Skin: Negative for color change.  Neurological: Positive for tremors. Negative for speech difficulty, weakness and headaches.       Memory lapses. Minimal resting tremor in fingers, not disabling.   Psychiatric/Behavioral: Positive for hallucinations. Negative for behavioral problems and sleep disturbance. The patient is nervous/anxious.        More visual hallucination, anxious.     Immunization History  Administered Date(s) Administered  . Hepatitis A, Adult 03/01/1997, 09/20/1997  . Influenza Split 09/09/2013, 09/27/2014  . Influenza, High Dose Seasonal PF 08/01/2011, 08/27/2012, 10/14/2013, 09/08/2015, 09/10/2016, 09/12/2017, 09/12/2018, 09/15/2019  . Influenza,inj,Quad PF,6+ Mos 09/11/2016  . Influenza-Unspecified 09/21/2020  . Moderna Sars-Covid-2 Vaccination 12/12/2019, 01/09/2020, 10/18/2020  . Pneumococcal Conjugate-13 04/15/2014  . Pneumococcal Polysaccharide-23 09/28/2005  . Td 06/20/2016  . Tdap 04/29/2006, 09/01/2015  . Zoster Recombinat (Shingrix) 06/11/2018, 09/10/2018  . Zoster, Live 04/29/2006   Pertinent  Health Maintenance Due  Topic Date Due  . INFLUENZA VACCINE  07/10/2021  . DEXA SCAN  Completed  . PNA vac Low Risk Adult  Completed   Fall Risk  11/16/2019 04/23/2019 04/09/2019 10/29/2018 10/27/2018  Falls in the past year? 0 0 0 0 1  Comment - - - fell June 2019 -  Number falls in past yr: - 0 0 - 0  Injury with Fall? - 0 0 - 1  Comment - - -  - -  Risk for fall  due to : - - - - -  Risk for fall due to: Comment - - - - -   Functional Status Survey:    Vitals:   05/12/21 1616  BP: 110/60  Pulse: 79  Resp: 16  Temp: 98.1 F (36.7 C)  SpO2: 98%  Weight: 138 lb (62.6 kg)  Height: _0  (1.575 m)   Body mass index is 25.24 kg/m. Physical Exam Vitals and nursing note reviewed.  Constitutional:      Appearance: Normal appearance.  HENT:     Head: Normocephalic and atraumatic.     Mouth/Throat:     Mouth: Mucous membranes are moist.  Eyes:     Extraocular Movements: Extraocular movements intact.     Conjunctiva/sclera: Conjunctivae normal.     Pupils: Pupils are equal, round, and reactive to light.  Cardiovascular:     Rate and Rhythm: Normal rate and regular rhythm.     Heart sounds: Murmur heard.    Pulmonary:     Breath sounds: No rhonchi or rales.  Abdominal:     General: Bowel sounds are normal.     Palpations: Abdomen is soft.     Tenderness: There is no abdominal tenderness. There is no right CVA tenderness, left CVA tenderness, guarding or rebound.  Musculoskeletal:        General: Tenderness present.     Cervical back: Normal range of motion and neck supple.     Right lower leg: Edema present.     Left lower leg: Edema present.     Comments: Trace edema BLE.  Arthritic changes in fingers, R>L. The R 3rd finger mid knuckle swelling, no heat or redness. Left ankle pain, in brace. Chronic lower back pain.  Skin:    General: Skin is warm and dry.     Comments: A marble sized hematoma occiput, abrasion mid thoracic spine.   Neurological:     General: No focal deficit present.     Mental Status: She is alert. Mental status is at baseline.     Gait: Gait abnormal.     Comments: Oriented to person, place. Ambulates with walker.   Psychiatric:        Mood and Affect: Mood normal.        Behavior: Behavior normal.     Comments: Visual hallucination vs delusion, anxiety     Labs reviewed: Recent Labs     05/24/20 1015 08/04/20 0000 10/05/20 0000 11/22/20 1109 04/12/21 0005  NA 141   < > 142 141 140  K 3.5   < > 4.2 4.2 4.2  CL 107   < > 107 104 104  CO2 24   < > 25* 22 25*  GLUCOSE 100*  --   --  104*  --   BUN 12   < > _1 CREATININE 0.81   < > 0.9 1.01* 0.9  CALCIUM 8.9   < > 9.6 10.0 9.8   < > = values in this interval not displayed.   Recent Labs    10/05/20 0000 04/12/21 0005  AST 19 19  ALT 15 8  ALKPHOS 62 78  ALBUMIN 3.5 4.1   Recent Labs    05/24/20 1015 06/21/20 0000 08/04/20 0000 10/05/20 0000 04/12/21 0005  WBC 7.8   < > 6.2 4.8 6.7  NEUTROABS  --    < > 3,677 2,328.00 4,147.00  HGB 9.8*   < > 8.9* 9.6* 11.0*  HCT 30.9*   < >  28* 30* 34*  MCV 98.4  --   --   --   --   PLT 286   < > 354 288 282   < > = values in this interval not displayed.   Lab Results  Component Value Date   TSH 1.62 04/12/2021   No results found for: HGBA1C Lab Results  Component Value Date   CHOL 105 10/05/2020   HDL 46 10/05/2020   LDLCALC 38 10/05/2020   TRIG 119 10/05/2020   CHOLHDL 2.4 07/14/2019    Significant Diagnostic Results in last 30 days:  No results found.  Assessment/Plan: Depression, psychotic ([place]) reported the patient's increased anxiety, calling her husband early in am, more frequent visual hallucination-saw 2 dogs came into her room early today, also saw 4 people with pills showed up in her room. Will increase Seroquel 48m qhs 12.574mqam, update CBC/diff, CMP/eGFR in setting of ? Black stools.   Iron deficiency anemia The patient stated she had 3 black stools today vs some other times? Her husband thinks her stool was normal. The patient denied indigestion, heartburns, abd pain, nausea, vomiting, constipation, diarrhea.  takes Fe, Hgb 11 04/11/21. Update CBC/diff.     Aortic stenosis AS Cardiology palliative approach, see Cardiology next Monday.    Chronic diastolic CHF (congestive heart failure) (HCC) CHF/trace edema BLE,  takes Furosemide 2055md, cardiology, severe aortic valve stenosis.Bun/creat18/0.89 04/11/21, Echocardiogram 11/15/20 EF 60-65%   Erosive (osteo)arthritis Erosive arthritis, takes Plaquenil 200m74m, Tylenol 500mg69m, f/u Rheumatology.   Hypothyroidism Hypothyroidism, takes Levothyroxine 50mcg67mTSH 1.62 04/11/21   Chronic constipation Constipation, takes Senokot S I qd, Amitiza 24mcgb38mColace qd   Parkinson's disease Gait abnormality, takes MiraPex 1mg bid52minemet 25/100 II bid.   GERD (gastroesophageal reflux disease) takesPantoprazole 40mg qd,60motidine 20mg qd.y45my FOBT per GI   Recurrent UTI Hx of rena lithiasis, recurrent UTI, urosepsis, takes Keflex for suppression therapy, f/u Urology  Closed multiple fractures of left upper extremity with ribs with routine healing Closed fracture of multiple ribs of left side with routine healing, takes Tramadol, pain is better controlled.      Family/ staff Communication: plan of care reviewed with the patient and charge nurse.   Labs/tests ordered:  CBC/diff, CMP/eGFR  Time spend 35 minutes.

## 2021-05-12 NOTE — Assessment & Plan Note (Signed)
CHF/trace edema BLE, takes Furosemide 20mg  qd, cardiology, severe aortic valve stenosis.Bun/creat18/0.89 04/11/21, Echocardiogram 11/15/20 EF 60-65%

## 2021-05-12 NOTE — Assessment & Plan Note (Signed)
Closed fracture of multiple ribs of left side with routine healing, takes Tramadol, pain is better controlled.

## 2021-05-12 NOTE — Assessment & Plan Note (Signed)
Erosive arthritis, takes Plaquenil 200mg  qd, Tylenol 500mg  bid, f/u Rheumatology.

## 2021-05-12 NOTE — Assessment & Plan Note (Signed)
Gait abnormality, takes MiraPex 1mg  bid, Sinemet 25/100 II bid.

## 2021-05-12 NOTE — Assessment & Plan Note (Signed)
Hx of rena lithiasis, recurrent UTI, urosepsis, takes Keflex for suppression therapy, f/u Urology

## 2021-05-12 NOTE — Assessment & Plan Note (Signed)
Hypothyroidism, takes Levothyroxine 81mcg qd.TSH 1.62 04/11/21

## 2021-05-12 NOTE — Assessment & Plan Note (Signed)
The patient stated she had 3 black stools today vs some other times? Her husband thinks her stool was normal. The patient denied indigestion, heartburns, abd pain, nausea, vomiting, constipation, diarrhea.  takes Fe, Hgb 11 04/11/21. Update CBC/diff.

## 2021-05-12 NOTE — Assessment & Plan Note (Signed)
reported the patient's increased anxiety, calling her husband early in am, more frequent visual hallucination-saw 2 dogs came into her room early today, also saw 4 people with pills showed up in her room. Will increase Seroquel 70m qhs 12.525mqam, update CBC/diff, CMP/eGFR in setting of ? Black stools.

## 2021-05-15 ENCOUNTER — Ambulatory Visit (INDEPENDENT_AMBULATORY_CARE_PROVIDER_SITE_OTHER): Payer: Medicare Other | Admitting: Cardiology

## 2021-05-15 ENCOUNTER — Other Ambulatory Visit: Payer: Self-pay

## 2021-05-15 ENCOUNTER — Encounter: Payer: Self-pay | Admitting: Nurse Practitioner

## 2021-05-15 VITALS — BP 98/50 | HR 80 | Ht <= 58 in | Wt 126.2 lb

## 2021-05-15 DIAGNOSIS — I350 Nonrheumatic aortic (valve) stenosis: Secondary | ICD-10-CM | POA: Diagnosis not present

## 2021-05-15 DIAGNOSIS — E7800 Pure hypercholesterolemia, unspecified: Secondary | ICD-10-CM

## 2021-05-15 DIAGNOSIS — I739 Peripheral vascular disease, unspecified: Secondary | ICD-10-CM

## 2021-05-15 DIAGNOSIS — I10 Essential (primary) hypertension: Secondary | ICD-10-CM | POA: Diagnosis not present

## 2021-05-15 DIAGNOSIS — M79672 Pain in left foot: Secondary | ICD-10-CM | POA: Diagnosis not present

## 2021-05-15 DIAGNOSIS — M25512 Pain in left shoulder: Secondary | ICD-10-CM | POA: Diagnosis not present

## 2021-05-15 DIAGNOSIS — R2681 Unsteadiness on feet: Secondary | ICD-10-CM | POA: Diagnosis not present

## 2021-05-15 DIAGNOSIS — M6281 Muscle weakness (generalized): Secondary | ICD-10-CM | POA: Diagnosis not present

## 2021-05-15 DIAGNOSIS — I5032 Chronic diastolic (congestive) heart failure: Secondary | ICD-10-CM

## 2021-05-15 DIAGNOSIS — R29898 Other symptoms and signs involving the musculoskeletal system: Secondary | ICD-10-CM | POA: Diagnosis not present

## 2021-05-15 DIAGNOSIS — M25522 Pain in left elbow: Secondary | ICD-10-CM | POA: Diagnosis not present

## 2021-05-15 MED ORDER — FUROSEMIDE 20 MG PO TABS: 20.0000 mg | ORAL_TABLET | Freq: Every day | ORAL | 3 refills | Status: DC

## 2021-05-15 NOTE — Addendum Note (Signed)
Addended by: Antonieta Iba on: 05/15/2021 11:01 AM   Modules accepted: Orders

## 2021-05-15 NOTE — Patient Instructions (Signed)

## 2021-05-15 NOTE — Progress Notes (Signed)
Cardiology Office Note:    Date:  05/15/2021   ID:  [name], DOB [birth year], MRN [id number]  PCP:  [name], MD  Cardiologist:  Fransico Him, MD    Referring MD: [name], MD   Chief Complaint  Patient presents with  . Congestive Heart Failure  . Hypertension  . Aortic Stenosis  . Hyperlipidemia    History of Present Illness:    [name] is a 87 y.o. female with a hx of chronic SOB secondary to diastolic dysfunction, chronic diastolic CHF, deconditioning, severe AS and obesity.Aheart cath in 2006 showed normal coronary arteries and normal LVF with normal LVEDP.She also has a history of HTN,chronic LE edema and PVD followed by [name]. She was evaluated by Structural heart team and considered not to be a candidate for TAVR and recommended Palliative Care approach.   She is here today for followup. I have reviewed her last note from her PCP and has been having increased anxiety and visual hallucinations.  Her husband is with her today She has chronic DOE that she thinks has gotten worse.  She is very sedentary and when she tries to walk more then she gets SOB.  She lives at [facility] and she has to walk down a long hall to get to the dinning room. She has chronic LE edema that continues to be a problem for her.  She is not compliant in using her hose on a daily basis.   She denies any chest pain or pressure, PND, orthopnea, dizziness, palpitations or syncope. She is compliant with her meds and is tolerating meds with no SE.    Past Medical History:  Diagnosis Date  . Abnormal nuclear stress test   . Actinic cheilitis 04/23/2019  . Anemia    iron deficiency   . Anxiety   . Aortic stenosis    severe by echo 2021   . Arthritis   . Broken arm    right   . Chronic diastolic CHF (congestive heart failure) ([place])   . Chronic pain   . Cognitive communication deficit   . Constipation   . Depression   . Erosive (osteo)arthritis 03/09/2020  .  Family history of adverse reaction to anesthesia    pt. states sister vomits  . Fibromyalgia   . GERD (gastroesophageal reflux disease)   . H/O hiatal hernia   . Hallucinations, visual 07/21/2018   03/10/20 psych consult.   . Hematuria 05/18/2020  . History of bronchitis   . History of kidney stones   . Hypercholesterolemia   . Hypertension    dr t Ivee Poellnitz  . Hypothyroidism   . Memory loss 04/21/2018   08/08/18 CT head no traumatic findings, atrophy.  09/27/19 MMSE 25/30, failed the clock drawing  . Microhematuria 03/24/2015  . Neuropathy   . Osteoporosis   . Parkinson disease ([place])   . PVD (peripheral vascular disease) ([facility])    99% stenosis of left anteiror tibial artery, mod stenosis of left distal SFA and popliteal artery followed by [name]  . RBBB    noted on EKG 2018  . Restless leg syndrome   . Sepsis ([place])    hx of due to [facility]  . Septic shock ([place]) 04/19/2020  . Shortness of breath    with exertion - chronic   . Sjogren's disease ([place])   . Sjogren's syndrome ([place]) 02/04/2018   04/16/19 rheumatology: Erosive OA of hands, Sjogrens syndrome, low back pain at multiple sites,  recurrent kidney stones, age related osteoporosis w/o current pathological fracture, f/u 6 months.   . SOB (shortness of breath)    chronic due to diastolic dysfunction, deconditioning, obesity  . Spinal stenosis    lumbar region   . Tremor   . Unstable gait 04/21/2018  . Unsteadiness on feet   . Urinary frequency 09/18/2018  . Urinary tract infection   . Visual hallucinations     Past Surgical History:  Procedure Laterality Date  . ABDOMINAL HYSTERECTOMY    . BACK SURGERY     4 back surgeries,   lumbar fusion  . CARDIAC CATHETERIZATION  2006   normal  . CYSTOSCOPY/URETEROSCOPY/HOLMIUM LASER/STENT PLACEMENT Left 01/06/2019   Procedure: CYSTOSCOPY/RETROGRADE/URETEROSCOPY/HOLMIUM LASER/BASKET RETRIEVAL/STENT PLACEMENT;  Surgeon: Ceasar Mons, MD;  Location: [facility];  Service: Urology;   Laterality: Left;  . CYSTOSCOPY/URETEROSCOPY/HOLMIUM LASER/STENT PLACEMENT Bilateral 05/24/2020   Procedure: CYSTOSCOPY/RETROGRADE/URETEROSCOPY/HOLMIUM LASER/STENT PLACEMENT;  Surgeon: Ceasar Mons, MD;  Location: [facility];  Service: Urology;  Laterality: Bilateral;  . EYE SURGERY Bilateral    cateracts  . falls     variious fall, broken wrist,and toes  . FRACTURE SURGERY Right April 2016   Wrist, Pt. fell  . IR NEPHROSTOMY PLACEMENT RIGHT  04/19/2020  . kidney stone removal Left 01/20/2019  . RIGHT/LEFT HEART CATH AND CORONARY ANGIOGRAPHY N/A 02/05/2018   Procedure: RIGHT/LEFT HEART CATH AND CORONARY ANGIOGRAPHY;  Surgeon: Troy Sine, MD;  Location: [facility];  Service: Cardiovascular;  Laterality: N/A;  . SPINAL CORD STIMULATOR INSERTION N/A 09/09/2015   Procedure: LUMBAR SPINAL CORD STIMULATOR INSERTION;  Surgeon: Clydell Hakim, MD;  Location: [facility];  Service: Neurosurgery;  Laterality: N/A;  LUMBAR SPINAL CORD STIMULATOR INSERTION  . SPINE SURGERY  April 2013   Back X's 4  . TOTAL HIP ARTHROPLASTY     right  . WRIST FRACTURE SURGERY Bilateral     Current Medications: Current Meds  Medication Sig  . acetaminophen (TYLENOL) 500 MG tablet Take 500 mg by mouth 3 (three) times daily as needed for mild pain. not to exceed 3 gm in 24 hours. As needed for arthritis pain  . aspirin EC 81 MG tablet Take 81 mg by mouth daily.   . Calcium Carb-Cholecalciferol (CALCIUM 600/VITAMIN D3) 600-800 MG-UNIT TABS Take 1 tablet by mouth daily.  . carbidopa-levodopa (SINEMET IR) 25-100 MG tablet Take 2 tablets by mouth in the morning and at bedtime.   . cephALEXin (KEFLEX) 250 MG capsule Take 250 mg by mouth daily.   . Cholecalciferol (VITAMIN D) 50 MCG (2000 UT) tablet Take 2,000 Units by mouth daily.  Marland Kitchen docusate sodium (COLACE) 100 MG capsule Take 100 mg by mouth at bedtime.  . famotidine (PEPCID) 20 MG tablet Take 20 mg by mouth daily.  . Ferrous Sulfate (SLOW FE) 142 (45  Fe) MG TBCR Take 1 tablet by mouth. Daily  . furosemide (LASIX) 20 MG tablet Take 20 mg by mouth daily as needed.  . furosemide (LASIX) 20 MG tablet Take 20 mg by mouth daily.  . hydroxychloroquine (PLAQUENIL) 200 MG tablet Take 200 mg by mouth daily.  Marland Kitchen lactose free nutrition (BOOST) LIQD Take 237 mLs by mouth as needed.  Marland Kitchen levothyroxine (SYNTHROID, LEVOTHROID) 50 MCG tablet Take 50 mcg by mouth daily before breakfast.   . lubiprostone (AMITIZA) 24 MCG capsule Take 24 mcg by mouth 2 (two) times daily with a meal.   . MULTIPLE MINERALS-VITAMINS PO Take 1 tablet by mouth daily.  . Multiple Vitamins-Minerals (  PRESERVISION AREDS 2 PO) Take 1 tablet by mouth daily.  Marland Kitchen nystatin (MYCOSTATIN/NYSTOP) powder Apply topically 2 (two) times daily as needed.  . pantoprazole (PROTONIX) 40 MG tablet Take 40 mg by mouth daily.  Vladimir Faster Glycol-Propyl Glycol (SYSTANE) 0.4-0.3 % SOLN Apply 1 drop to eye 2 (two) times daily as needed (dry eyes).   . potassium citrate (UROCIT-K) 10 MEQ (1080 MG) SR tablet Take 10 mEq by mouth 2 (two) times daily.  . pramipexole (MIRAPEX) 1 MG tablet Take 1 mg by mouth 2 (two) times daily.  . QUEtiapine (SEROQUEL) 25 MG tablet Take 25 mg by mouth daily.  Marland Kitchen saccharomyces boulardii (FLORASTOR) 250 MG capsule Take 250 mg by mouth daily.   Marland Kitchen senna-docusate (SENOKOT-S) 8.6-50 MG tablet Take 1 tablet by mouth at bedtime.   . sertraline (ZOLOFT) 50 MG tablet Take 75 mg by mouth at bedtime. 1-1/2 tablets to = 75 mg  . Skin Protectants, Misc. (EUCERIN) cream Apply 1 application topically daily.  Marland Kitchen Spacer/Aero-Holding Chambers (AEROCHAMBER MV) inhaler by Other route. Use as instructed as needed  . traMADol (ULTRAM) 50 MG tablet Take 1 tablet (50 mg total) by mouth 3 (three) times daily.  . traMADol (ULTRAM) 50 MG tablet Take 25 mg by mouth 3 (three) times daily.     Allergies:   Adrenalone, Lactose, Morphine, Sulfa antibiotics, Codeine, Lactose intolerance (gi), Latex, Lyrica  [pregabalin], Other, Plaquenil [hydroxychloroquine sulfate], Reglan [metoclopramide], Requip [ropinirole hcl], Septra [sulfamethoxazole-trimethoprim], and Shellfish allergy   Social History   Socioeconomic History  . Marital status: Married    Spouse name: [name]  . Number of children: 2  . Years of education: HS  . Highest education level: Not on file  Occupational History    Comment: Homemaker  Tobacco Use  . Smoking status: Never Smoker  . Smokeless tobacco: Never Used  Vaping Use  . Vaping Use: Never used  Substance and Sexual Activity  . Alcohol use: No    Alcohol/week: 0.0 standard drinks  . Drug use: No  . Sexual activity: Never  Other Topics Concern  . Not on file  Social History Narrative   Patient lives at home with her spouse. Married in 1957. Two people lives in the home, no pets.    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    Caffeine Use: tea   Social Determinants of Radio broadcast assistant Strain: Not on file  Food Insecurity: Not on file  Transportation Needs: Not on file  Physical Activity: Not on file  Stress: Not on file  Social Connections: Not on file     Family History: The patient's family history includes COPD in her father; Heart attack in her mother; Heart disease in her mother; Heart failure in her mother; Hypertension in her mother; Restless legs syndrome in her mother.  ROS:   Please see the history of present illness.    Review of Systems  Skin: Negative for rash.    All other systems reviewed and negative.   EKGs/Labs/Other Studies Reviewed:    The following studies were reviewed today: EKG, prior OV notes, labs from PCP  EKG:  EKG is  ordered today.  The ekg ordered today demonstrates NSR with RBBB Recent Labs: 11/22/2020: NT-Pro BNP 426 04/12/2021: ALT 8; BUN 18; Creatinine 0.9; Hemoglobin 11.0; Platelets 282; Potassium 4.2; Sodium 140; TSH 1.62   Recent Lipid Panel     Component Value Date/Time  CHOL 105 10/05/2020 0000   CHOL 118 07/14/2019 0811   TRIG 119 10/05/2020 0000   HDL 46 10/05/2020 0000   HDL 50 07/14/2019 0811   CHOLHDL 2.4 07/14/2019 0811   CHOLHDL 2.7 07/01/2018 0715   LDLCALC 38 10/05/2020 0000   LDLCALC 32 07/14/2019 0811   LDLCALC 77 07/01/2018 0715    Physical Exam:    VS:  BP (!) 98/50   Pulse 80   Ht 4\' 10"  (1.473 m)   Wt 126 lb 3.2 oz (57.2 kg)   SpO2 99%   BMI 26.38 kg/m     Wt Readings from Last 3 Encounters:  05/15/21 126 lb 3.2 oz (57.2 kg)  05/12/21 138 lb (62.6 kg)  05/02/21 141 lb 1.6 oz (64 kg)    GEN: Well nourished, well developed in no acute distress HEENT: Normal NECK: No JVD LYMPHATICS: No lymphadenopathy CARDIAC:RRR, no rubs, gallops.  2/s late peaking SM at RUSB RESPIRATORY:  Clear to auscultation without rales, wheezing or rhonchi  ABDOMEN: Soft, non-tender, non-distended MUSCULOSKELETAL:  No edema; No deformity  SKIN: Warm and dry NEUROLOGIC:  Alert and oriented x 3 PSYCHIATRIC:  Normal affect    ASSESSMENT:    1. Chronic diastolic CHF (congestive heart failure) ([place])   2. Primary hypertension   3. PVD (peripheral vascular disease) ([place])   4. Nonrheumatic aortic valve stenosis   5. Hypercholesterolemia    PLAN:    In order of problems listed above:  1  Chronic diastolic CHF  -this is exacerbated by severe AS -she does not appear volume overloaded on exam today -She has chronic LE edema by the end of the day which is stable - she is very sedentary due to progression of Parkinson's so her LE edema is due to chronic dependent edema from sitting all day -she thinks that her SOB is worse but suspect this is from sedentary state as well as severe AS (BNP was normal on last check) -encouraged her to wear compression hose during the day to help with edema >> she has no edema today -Continue prescription drug management with Lasix 20mg  daily with PRN Lasix for worsening edemaa -I have  personally reviewed and interpreted outside labs performed by patient's PCP which showed SCr 0.9 and K+ 4.2 in may 2022  2.  HTN  -BP is controlled on exam and actually on the soft side today -encouraged her to wear her compression hose  3.  PVD  - s/p 99% stenosis of left anteiror tibial artery, mod stenosis of left distal SFA and popliteal artery followed by [name].   -continue statin  4.  Aortic stenosis -severe symptomatic Stage D1 AS with mean AVG 65mmHg by echo 11/2020 -evaluated in structural heart clinic and felt not to be a candidate for TAVR due to generalized deconditioning/severe Frailty and advanced age -now in in SNF  5.  Hyperlipidemia  - LDL goal < 70 given PVD.   -I have personally reviewed and interpreted outside labs performed by patient's PCP which showed LDL 38, HDL 46, TAGs 119 and ALT 8 in Oct 2021 and May 2022 -continue Zetia 10mg  daily and lovastatin 40mg  daily  Followup with me in 1 year or sooner if needed  Medication Adjustments/Labs and Tests Ordered: Current medicines are reviewed at length with the patient today.  Concerns regarding medicines are outlined above.  No orders of the defined types were placed in this encounter.  No orders of the defined types were placed in this encounter.  Signed, Fransico Him, MD  05/15/2021 10:39 AM    [facility]

## 2021-05-16 DIAGNOSIS — M79672 Pain in left foot: Secondary | ICD-10-CM | POA: Diagnosis not present

## 2021-05-16 DIAGNOSIS — I10 Essential (primary) hypertension: Secondary | ICD-10-CM | POA: Diagnosis not present

## 2021-05-16 DIAGNOSIS — M6281 Muscle weakness (generalized): Secondary | ICD-10-CM | POA: Diagnosis not present

## 2021-05-16 DIAGNOSIS — R2681 Unsteadiness on feet: Secondary | ICD-10-CM | POA: Diagnosis not present

## 2021-05-16 DIAGNOSIS — R443 Hallucinations, unspecified: Secondary | ICD-10-CM | POA: Diagnosis not present

## 2021-05-16 DIAGNOSIS — M25512 Pain in left shoulder: Secondary | ICD-10-CM | POA: Diagnosis not present

## 2021-05-16 DIAGNOSIS — R29898 Other symptoms and signs involving the musculoskeletal system: Secondary | ICD-10-CM | POA: Diagnosis not present

## 2021-05-16 DIAGNOSIS — D649 Anemia, unspecified: Secondary | ICD-10-CM | POA: Diagnosis not present

## 2021-05-16 DIAGNOSIS — M25522 Pain in left elbow: Secondary | ICD-10-CM | POA: Diagnosis not present

## 2021-05-17 DIAGNOSIS — R300 Dysuria: Secondary | ICD-10-CM | POA: Diagnosis not present

## 2021-05-17 DIAGNOSIS — M25512 Pain in left shoulder: Secondary | ICD-10-CM | POA: Diagnosis not present

## 2021-05-17 DIAGNOSIS — R2681 Unsteadiness on feet: Secondary | ICD-10-CM | POA: Diagnosis not present

## 2021-05-17 DIAGNOSIS — R29898 Other symptoms and signs involving the musculoskeletal system: Secondary | ICD-10-CM | POA: Diagnosis not present

## 2021-05-17 DIAGNOSIS — M25522 Pain in left elbow: Secondary | ICD-10-CM | POA: Diagnosis not present

## 2021-05-17 DIAGNOSIS — M79672 Pain in left foot: Secondary | ICD-10-CM | POA: Diagnosis not present

## 2021-05-17 DIAGNOSIS — M6281 Muscle weakness (generalized): Secondary | ICD-10-CM | POA: Diagnosis not present

## 2021-05-18 DIAGNOSIS — R29898 Other symptoms and signs involving the musculoskeletal system: Secondary | ICD-10-CM | POA: Diagnosis not present

## 2021-05-18 DIAGNOSIS — R2681 Unsteadiness on feet: Secondary | ICD-10-CM | POA: Diagnosis not present

## 2021-05-18 DIAGNOSIS — M25512 Pain in left shoulder: Secondary | ICD-10-CM | POA: Diagnosis not present

## 2021-05-18 DIAGNOSIS — M79672 Pain in left foot: Secondary | ICD-10-CM | POA: Diagnosis not present

## 2021-05-18 DIAGNOSIS — M6281 Muscle weakness (generalized): Secondary | ICD-10-CM | POA: Diagnosis not present

## 2021-05-18 DIAGNOSIS — M25522 Pain in left elbow: Secondary | ICD-10-CM | POA: Diagnosis not present

## 2021-05-19 DIAGNOSIS — R29898 Other symptoms and signs involving the musculoskeletal system: Secondary | ICD-10-CM | POA: Diagnosis not present

## 2021-05-19 DIAGNOSIS — R2681 Unsteadiness on feet: Secondary | ICD-10-CM | POA: Diagnosis not present

## 2021-05-19 DIAGNOSIS — M25512 Pain in left shoulder: Secondary | ICD-10-CM | POA: Diagnosis not present

## 2021-05-19 DIAGNOSIS — M25522 Pain in left elbow: Secondary | ICD-10-CM | POA: Diagnosis not present

## 2021-05-19 DIAGNOSIS — M6281 Muscle weakness (generalized): Secondary | ICD-10-CM | POA: Diagnosis not present

## 2021-05-19 DIAGNOSIS — M79672 Pain in left foot: Secondary | ICD-10-CM | POA: Diagnosis not present

## 2021-05-22 DIAGNOSIS — R2681 Unsteadiness on feet: Secondary | ICD-10-CM | POA: Diagnosis not present

## 2021-05-22 DIAGNOSIS — R29898 Other symptoms and signs involving the musculoskeletal system: Secondary | ICD-10-CM | POA: Diagnosis not present

## 2021-05-22 DIAGNOSIS — M25512 Pain in left shoulder: Secondary | ICD-10-CM | POA: Diagnosis not present

## 2021-05-22 DIAGNOSIS — M25522 Pain in left elbow: Secondary | ICD-10-CM | POA: Diagnosis not present

## 2021-05-22 DIAGNOSIS — M79672 Pain in left foot: Secondary | ICD-10-CM | POA: Diagnosis not present

## 2021-05-22 DIAGNOSIS — M6281 Muscle weakness (generalized): Secondary | ICD-10-CM | POA: Diagnosis not present

## 2021-05-23 DIAGNOSIS — R2681 Unsteadiness on feet: Secondary | ICD-10-CM | POA: Diagnosis not present

## 2021-05-23 DIAGNOSIS — M79672 Pain in left foot: Secondary | ICD-10-CM | POA: Diagnosis not present

## 2021-05-23 DIAGNOSIS — M25522 Pain in left elbow: Secondary | ICD-10-CM | POA: Diagnosis not present

## 2021-05-23 DIAGNOSIS — M6281 Muscle weakness (generalized): Secondary | ICD-10-CM | POA: Diagnosis not present

## 2021-05-23 DIAGNOSIS — R300 Dysuria: Secondary | ICD-10-CM | POA: Diagnosis not present

## 2021-05-23 DIAGNOSIS — R29898 Other symptoms and signs involving the musculoskeletal system: Secondary | ICD-10-CM | POA: Diagnosis not present

## 2021-05-23 DIAGNOSIS — M25512 Pain in left shoulder: Secondary | ICD-10-CM | POA: Diagnosis not present

## 2021-05-24 DIAGNOSIS — M6281 Muscle weakness (generalized): Secondary | ICD-10-CM | POA: Diagnosis not present

## 2021-05-24 DIAGNOSIS — M25512 Pain in left shoulder: Secondary | ICD-10-CM | POA: Diagnosis not present

## 2021-05-24 DIAGNOSIS — M79672 Pain in left foot: Secondary | ICD-10-CM | POA: Diagnosis not present

## 2021-05-24 DIAGNOSIS — R2681 Unsteadiness on feet: Secondary | ICD-10-CM | POA: Diagnosis not present

## 2021-05-24 DIAGNOSIS — R29898 Other symptoms and signs involving the musculoskeletal system: Secondary | ICD-10-CM | POA: Diagnosis not present

## 2021-05-24 DIAGNOSIS — M25522 Pain in left elbow: Secondary | ICD-10-CM | POA: Diagnosis not present

## 2021-05-25 DIAGNOSIS — R2681 Unsteadiness on feet: Secondary | ICD-10-CM | POA: Diagnosis not present

## 2021-05-25 DIAGNOSIS — R29898 Other symptoms and signs involving the musculoskeletal system: Secondary | ICD-10-CM | POA: Diagnosis not present

## 2021-05-25 DIAGNOSIS — M79672 Pain in left foot: Secondary | ICD-10-CM | POA: Diagnosis not present

## 2021-05-25 DIAGNOSIS — M25512 Pain in left shoulder: Secondary | ICD-10-CM | POA: Diagnosis not present

## 2021-05-25 DIAGNOSIS — M6281 Muscle weakness (generalized): Secondary | ICD-10-CM | POA: Diagnosis not present

## 2021-05-25 DIAGNOSIS — M25522 Pain in left elbow: Secondary | ICD-10-CM | POA: Diagnosis not present

## 2021-05-26 ENCOUNTER — Non-Acute Institutional Stay (SKILLED_NURSING_FACILITY): Payer: Medicare Other | Admitting: Nurse Practitioner

## 2021-05-26 ENCOUNTER — Encounter: Payer: Self-pay | Admitting: Nurse Practitioner

## 2021-05-26 DIAGNOSIS — M6281 Muscle weakness (generalized): Secondary | ICD-10-CM | POA: Diagnosis not present

## 2021-05-26 DIAGNOSIS — M25512 Pain in left shoulder: Secondary | ICD-10-CM | POA: Diagnosis not present

## 2021-05-26 DIAGNOSIS — M79672 Pain in left foot: Secondary | ICD-10-CM | POA: Diagnosis not present

## 2021-05-26 DIAGNOSIS — K219 Gastro-esophageal reflux disease without esophagitis: Secondary | ICD-10-CM

## 2021-05-26 DIAGNOSIS — D508 Other iron deficiency anemias: Secondary | ICD-10-CM

## 2021-05-26 DIAGNOSIS — S2249XD Multiple fractures of ribs, unspecified side, subsequent encounter for fracture with routine healing: Secondary | ICD-10-CM | POA: Diagnosis not present

## 2021-05-26 DIAGNOSIS — N390 Urinary tract infection, site not specified: Secondary | ICD-10-CM

## 2021-05-26 DIAGNOSIS — M154 Erosive (osteo)arthritis: Secondary | ICD-10-CM | POA: Diagnosis not present

## 2021-05-26 DIAGNOSIS — I350 Nonrheumatic aortic (valve) stenosis: Secondary | ICD-10-CM | POA: Diagnosis not present

## 2021-05-26 DIAGNOSIS — E039 Hypothyroidism, unspecified: Secondary | ICD-10-CM

## 2021-05-26 DIAGNOSIS — K5909 Other constipation: Secondary | ICD-10-CM

## 2021-05-26 DIAGNOSIS — G20 Parkinson's disease: Secondary | ICD-10-CM | POA: Diagnosis not present

## 2021-05-26 DIAGNOSIS — I5032 Chronic diastolic (congestive) heart failure: Secondary | ICD-10-CM | POA: Diagnosis not present

## 2021-05-26 DIAGNOSIS — S42302D Unspecified fracture of shaft of humerus, left arm, subsequent encounter for fracture with routine healing: Secondary | ICD-10-CM

## 2021-05-26 DIAGNOSIS — R2681 Unsteadiness on feet: Secondary | ICD-10-CM | POA: Diagnosis not present

## 2021-05-26 DIAGNOSIS — F323 Major depressive disorder, single episode, severe with psychotic features: Secondary | ICD-10-CM | POA: Diagnosis not present

## 2021-05-26 DIAGNOSIS — R29898 Other symptoms and signs involving the musculoskeletal system: Secondary | ICD-10-CM | POA: Diagnosis not present

## 2021-05-26 DIAGNOSIS — M25522 Pain in left elbow: Secondary | ICD-10-CM | POA: Diagnosis not present

## 2021-05-26 NOTE — Assessment & Plan Note (Signed)
stabilizing, takes Sertraline 75mg  qd, Quetiapine was increased to 25mg  qhs, 12.5mg  qam since 05/12/21

## 2021-05-26 NOTE — Assessment & Plan Note (Signed)
AS Cardiology palliative approach

## 2021-05-26 NOTE — Assessment & Plan Note (Signed)
takes Fe, Hgb 11 04/11/21

## 2021-05-26 NOTE — Assessment & Plan Note (Signed)
Closed fracture of multiple ribs of left side with routine healing, takes Tramadol, pain is better controlled.

## 2021-05-26 NOTE — Assessment & Plan Note (Signed)
takes Plaquenil 200mg  qd, Tylenol 500mg  bid, f/u Rheumatology.

## 2021-05-26 NOTE — Assessment & Plan Note (Signed)
takes Levothyroxine 107mcg qd. TSH 1.62 04/11/21

## 2021-05-26 NOTE — Progress Notes (Addendum)
Location:   S[facility] [place] Room Number: 45 Place of Service:  SNF (31) Provider: [facility] [place] NP  [name], MD  Patient Care Team: [name], MD as PCP - General (Internal Medicine) [name], MD as PCP - Cardiology (Cardiology) [name], MD (Neurosurgery) [name], MD (Neurology) [name], MD (Inactive) as Consulting Physician (Gastroenterology) [name], MD as Consulting Physician (Urology) Denica Web X, NP as Nurse Practitioner (Internal Medicine)  Extended Emergency Contact Information Primary Emergency Contact: [place] Address: [address]          [place], [place] [address] [name] of [place] Phone: [phone] Mobile Phone: [phone] Relation: Spouse Secondary Emergency Contact: [name] Mobile Phone: [phone] Relation: Daughter Preferred language: [name] Interpreter needed? No  Code Status:  DNR Goals of care: Advanced Directive information Advanced Directives 05/12/2021  Does Patient Have a Medical Advance Directive? Yes  Type of Advance Directive Living will  Does patient want to make changes to medical advance directive? No - Patient declined  Copy of [place] in Chart? -  Would patient like information on creating a medical advance directive? -  Pre-existing out of facility DNR order (yellow form or pink MOST form) Yellow form placed in chart (order not valid for inpatient use)     Chief Complaint  Patient presents with   Medical Management of Chronic Issues     HPI:  Pt is a 87 y.o. female seen today for medical management of chronic diseases.      Closed fracture of multiple ribs of left side with routine healing, takes Tramadol, pain is better controlled.             Hx of rena lithiasis, recurrent UTI, urosepsis, takes Keflex for suppression therapy, f/u Urology. On and off dysuria, UA C/S 05/23/21 P Aeruginosa >100,000c/ml susceptible to  cephalosporins, ? Colonization, 05/26/21 placed on Cefdinir 300mg  bid x 7days.              GERD, takes Pantoprazole 40mg  qd, Famotidine 20mg  qd. yearly FOBT per GI, Hgb 11.0 04/12/21             Parkinson's, takes MiraPex 1mg  bid, Sinemet 25/100 II bid.             Depression/hallucination, stabilizing, takes Sertraline 75mg  qd, Quetiapine was increased to 25mg  qhs, 12.5mg  qam since 05/12/21             Constipation, takes Senokot S I qd, Amitiza 55mcg bid, Colace qd             Hypothyroidism, takes Levothyroxine 8mcg qd. TSH 1.62 04/11/21             Erosive arthritis, takes Plaquenil 200mg  qd, Tylenol 500mg  bid, f/u Rheumatology.              CHF/trace edema BLE, takes Furosemide 20mg  qd, cardiology, severe aortic valve stenosis. Bun/creat 18/0.89 04/11/21, Echocardiogram 11/15/20 EF 60-65%             Anemia, takes Fe, Hgb 11 04/11/21             AS Cardiology palliative approach   Past Medical History:  Diagnosis Date   Abnormal nuclear stress test    Actinic cheilitis 04/23/2019   Anemia    iron deficiency    Anxiety    Aortic stenosis    severe by echo 2021    Arthritis    Broken arm  right    Chronic diastolic CHF (congestive heart failure) (HCC)    Chronic pain    Cognitive communication deficit    Constipation    Depression    Erosive (osteo)arthritis 03/09/2020   Family history of adverse reaction to anesthesia    pt. states sister vomits   Fibromyalgia    GERD (gastroesophageal reflux disease)    H/O hiatal hernia    Hallucinations, visual 07/21/2018   03/10/20 psych consult.    Hematuria 05/18/2020   History of bronchitis    History of kidney stones    Hypercholesterolemia    Hypertension    dr t turner   Hypothyroidism    Memory loss 04/21/2018   08/08/18 CT head no traumatic findings, atrophy.  09/27/19 MMSE 25/30, failed the clock drawing   Microhematuria 03/24/2015   Neuropathy    Osteoporosis    Parkinson disease ([place])    PVD (peripheral vascular disease) (HCC)     99% stenosis of left anteiror tibial artery, mod stenosis of left distal SFA and popliteal artery followed by [name]   RBBB    noted on EKG 2018   Restless leg syndrome    Sepsis ([place])    hx of due to Ecoli   Septic shock ([place]) 04/19/2020   Shortness of breath    with exertion - chronic    Sjogren's disease ([place])    Sjogren's syndrome ([place]) 02/04/2018   04/16/19 rheumatology: Erosive OA of hands, Sjogrens syndrome, low back pain at multiple sites, recurrent kidney stones, age related osteoporosis w/o current pathological fracture, f/u 6 months.    SOB (shortness of breath)    chronic due to diastolic dysfunction, deconditioning, obesity   Spinal stenosis    lumbar region    Tremor    Unstable gait 04/21/2018   Unsteadiness on feet    Urinary frequency 09/18/2018   Urinary tract infection    Visual hallucinations    Past Surgical History:  Procedure Laterality Date   ABDOMINAL HYSTERECTOMY     BACK SURGERY     4 back surgeries,   lumbar fusion   CARDIAC CATHETERIZATION  2006   normal   CYSTOSCOPY/URETEROSCOPY/HOLMIUM LASER/STENT PLACEMENT Left 01/06/2019   Procedure: CYSTOSCOPY/RETROGRADE/URETEROSCOPY/HOLMIUM LASER/BASKET RETRIEVAL/STENT PLACEMENT;  Surgeon: Ceasar Mons, MD;  Location: [facility];  Service: Urology;  Laterality: Left;   CYSTOSCOPY/URETEROSCOPY/HOLMIUM LASER/STENT PLACEMENT Bilateral 05/24/2020   Procedure: CYSTOSCOPY/RETROGRADE/URETEROSCOPY/HOLMIUM LASER/STENT PLACEMENT;  Surgeon: Ceasar Mons, MD;  Location: [facility];  Service: Urology;  Laterality: Bilateral;   EYE SURGERY Bilateral    cateracts   falls     variious fall, broken wrist,and toes   FRACTURE SURGERY Right April 2016   Wrist, Pt. fell   IR NEPHROSTOMY PLACEMENT RIGHT  04/19/2020   kidney stone removal Left 01/20/2019   RIGHT/LEFT HEART CATH AND CORONARY ANGIOGRAPHY N/A 02/05/2018   Procedure: RIGHT/LEFT HEART CATH AND CORONARY ANGIOGRAPHY;  Surgeon: Troy Sine, MD;   Location: [place] [facility];  Service: Cardiovascular;  Laterality: N/A;   SPINAL CORD STIMULATOR INSERTION N/A 09/09/2015   Procedure: LUMBAR SPINAL CORD STIMULATOR INSERTION;  Surgeon: Clydell Hakim, MD;  Location: [facility];  Service: Neurosurgery;  Laterality: N/A;  LUMBAR SPINAL CORD STIMULATOR INSERTION   SPINE SURGERY  April 2013   Back X's 4   TOTAL HIP ARTHROPLASTY     right   WRIST FRACTURE SURGERY Bilateral     Allergies  Allergen Reactions   Adrenalone    Lactose Other (See Comments)  abd pain, lactose intolerant   Morphine Other (See Comments)   Sulfa Antibiotics Other (See Comments)    Headache, very sick   Codeine Other (See Comments)    Reaction:  Headaches and nightmares    Lactose Intolerance (Gi) Nausea And Vomiting   Latex Rash   Lyrica [Pregabalin] Swelling and Other (See Comments)    Reaction:  Leg swelling   Other Other (See Comments)    Pt states that pain medications give her nightmares.     Plaquenil [Hydroxychloroquine Sulfate] Other (See Comments)    Reaction:  GI upset    Reglan [Metoclopramide] Other (See Comments)    Reaction:  GI upset    Requip [Ropinirole Hcl] Other (See Comments)    Reaction:  GI upset    Septra [Sulfamethoxazole-Trimethoprim] Nausea And Vomiting   Shellfish Allergy Nausea And Vomiting    Allergies as of 05/26/2021       Reactions   Adrenalone    Lactose Other (See Comments)   abd pain, lactose intolerant   Morphine Other (See Comments)   Sulfa Antibiotics Other (See Comments)   Headache, very sick   Codeine Other (See Comments)   Reaction:  Headaches and nightmares    Lactose Intolerance (gi) Nausea And Vomiting   Latex Rash   Lyrica [pregabalin] Swelling, Other (See Comments)   Reaction:  Leg swelling   Other Other (See Comments)   Pt states that pain medications give her nightmares.     Plaquenil [hydroxychloroquine Sulfate] Other (See Comments)   Reaction:  GI upset    Reglan [metoclopramide] Other (See  Comments)   Reaction:  GI upset    Requip [ropinirole Hcl] Other (See Comments)   Reaction:  GI upset    Septra [sulfamethoxazole-trimethoprim] Nausea And Vomiting   Shellfish Allergy Nausea And Vomiting        Medication List        Accurate as of May 26, 2021 11:59 PM. If you have any questions, ask your nurse or doctor.          acetaminophen 500 MG tablet Commonly known as: TYLENOL Take 500 mg by mouth 3 (three) times daily as needed for mild pain. not to exceed 3 gm in 24 hours. As needed for arthritis pain   AeroChamber MV inhaler by Other route. Use as instructed as needed   aspirin EC 81 MG tablet Take 81 mg by mouth daily.   Calcium 600/Vitamin D3 600-800 MG-UNIT Tabs Generic drug: Calcium Carb-Cholecalciferol Take 1 tablet by mouth daily.   carbidopa-levodopa 25-100 MG tablet Commonly known as: SINEMET IR Take 2 tablets by mouth in the morning and at bedtime.   cephALEXin 250 MG capsule Commonly known as: KEFLEX Take 250 mg by mouth daily.   docusate sodium 100 MG capsule Commonly known as: COLACE Take 100 mg by mouth at bedtime.   eucerin cream Apply 1 application topically daily.   famotidine 20 MG tablet Commonly known as: PEPCID Take 20 mg by mouth daily.   furosemide 20 MG tablet Commonly known as: LASIX Take 20 mg by mouth daily as needed.   furosemide 20 MG tablet Commonly known as: LASIX Take 1 tablet (20 mg total) by mouth daily.   hydroxychloroquine 200 MG tablet Commonly known as: PLAQUENIL Take 200 mg by mouth daily.   lactose free nutrition Liqd Take 237 mLs by mouth as needed.   levothyroxine 50 MCG tablet Commonly known as: SYNTHROID Take 50 mcg by mouth daily before breakfast.  lovastatin 40 MG tablet Commonly known as: MEVACOR Take 1 tablet by mouth daily.   lubiprostone 24 MCG capsule Commonly known as: AMITIZA Take 24 mcg by mouth 2 (two) times daily with a meal.   MULTIPLE MINERALS-VITAMINS PO Take 1  tablet by mouth daily.   nystatin powder Commonly known as: MYCOSTATIN/NYSTOP Apply topically 2 (two) times daily as needed.   pantoprazole 40 MG tablet Commonly known as: PROTONIX Take 40 mg by mouth daily.   potassium citrate 10 MEQ (1080 MG) SR tablet Commonly known as: UROCIT-K Take 10 mEq by mouth 2 (two) times daily.   pramipexole 1 MG tablet Commonly known as: MIRAPEX Take 1 mg by mouth 2 (two) times daily.   PRESERVISION AREDS 2 PO Take 1 tablet by mouth daily.   QUEtiapine 25 MG tablet Commonly known as: SEROQUEL Take 25 mg by mouth daily.   saccharomyces boulardii 250 MG capsule Commonly known as: FLORASTOR Take 250 mg by mouth daily.   senna-docusate 8.6-50 MG tablet Commonly known as: Senokot-S Take 1 tablet by mouth at bedtime.   sertraline 50 MG tablet Commonly known as: ZOLOFT Take 75 mg by mouth at bedtime. 1-1/2 tablets to = 75 mg   Slow Fe 142 (45 Fe) MG Tbcr Generic drug: Ferrous Sulfate Take 1 tablet by mouth. Daily   Systane 0.4-0.3 % Soln Generic drug: Polyethyl Glycol-Propyl Glycol Apply 1 drop to eye 2 (two) times daily as needed (dry eyes).   traMADol 50 MG tablet Commonly known as: ULTRAM Take 25 mg by mouth 3 (three) times daily.   traMADol 50 MG tablet Commonly known as: ULTRAM Take 1 tablet (50 mg total) by mouth 3 (three) times daily.   Vitamin D 50 MCG (2000 UT) tablet Take 2,000 Units by mouth daily.        Review of Systems  Constitutional:  Negative for fatigue, fever and unexpected weight change.  HENT:  Positive for hearing loss. Negative for congestion and voice change.   Eyes:  Negative for visual disturbance.  Respiratory:  Positive for shortness of breath. Negative for cough.        Chronic DOE  Cardiovascular:  Positive for leg swelling.  Gastrointestinal:  Negative for abdominal pain and constipation.       Supra pubic discomfort comes and goes.   Genitourinary:  Positive for dysuria and frequency. Negative  for hematuria and urgency.  Musculoskeletal:  Positive for arthralgias, back pain, gait problem and myalgias.       Chronic lower back pain. The R 3rd finger mid knuckle pain is chronic.  Skin:  Negative for color change.  Neurological:  Positive for tremors. Negative for speech difficulty, weakness and headaches.       Memory lapses. Minimal resting tremor in fingers, not disabling.   Psychiatric/Behavioral:  Positive for hallucinations. Negative for behavioral problems and sleep disturbance. The patient is nervous/anxious.        More visual hallucination, anxious.    Immunization History  Administered Date(s) Administered   Hepatitis A, Adult 03/01/1997, 09/20/1997   Influenza Split 09/09/2013, 09/27/2014   Influenza, High Dose Seasonal PF 08/01/2011, 08/27/2012, 10/14/2013, 09/08/2015, 09/10/2016, 09/12/2017, 09/12/2018, 09/15/2019   Influenza,inj,Quad PF,6+ Mos 09/11/2016   Influenza-Unspecified 09/21/2020   Moderna Sars-Covid-2 Vaccination 12/12/2019, 01/09/2020, 10/18/2020   Pneumococcal Conjugate-13 04/15/2014   Pneumococcal Polysaccharide-23 09/28/2005   Td 06/20/2016   Tdap 04/29/2006, 09/01/2015   Zoster Recombinat (Shingrix) 06/11/2018, 09/10/2018   Zoster, Live 04/29/2006   Pertinent  Health Maintenance Due  Topic Date Due   INFLUENZA VACCINE  07/10/2021   DEXA SCAN  Completed   PNA vac Low Risk Adult  Completed   Fall Risk  11/16/2019 04/23/2019 04/09/2019 10/29/2018 10/27/2018  Falls in the past year? 0 0 0 0 1  Comment - - - fell June 2019 -  Number falls in past yr: - 0 0 - 0  Injury with Fall? - 0 0 - 1  Comment - - - - -  Risk for fall due to : - - - - -  Risk for fall due to: Comment - - - - -   Functional Status Survey:    Vitals:   05/26/21 1534  BP: 120/72  Pulse: 76  Resp: 18  Temp: (!) 97.5 F (36.4 C)  SpO2: 97%   There is no height or weight on file to calculate BMI. Physical Exam Vitals and nursing note reviewed.  Constitutional:       Appearance: Normal appearance.  HENT:     Head: Normocephalic and atraumatic.     Mouth/Throat:     Mouth: Mucous membranes are moist.  Eyes:     Extraocular Movements: Extraocular movements intact.     Conjunctiva/sclera: Conjunctivae normal.     Pupils: Pupils are equal, round, and reactive to light.  Cardiovascular:     Rate and Rhythm: Normal rate and regular rhythm.     Heart sounds: Murmur heard.  Pulmonary:     Breath sounds: No rhonchi or rales.  Abdominal:     General: Bowel sounds are normal.     Palpations: Abdomen is soft.     Tenderness: There is no abdominal tenderness. There is no right CVA tenderness, left CVA tenderness, guarding or rebound.  Musculoskeletal:        General: Tenderness present.     Cervical back: Normal range of motion and neck supple.     Right lower leg: Edema present.     Left lower leg: Edema present.     Comments: Trace edema BLE.  Arthritic changes in fingers, R>L. The R 3rd finger mid knuckle swelling, no heat or redness. Left ankle pain, in brace. Chronic lower back pain.  Skin:    General: Skin is warm and dry.     Comments: A marble sized hematoma occiput, abrasion mid thoracic spine.   Neurological:     General: No focal deficit present.     Mental Status: She is alert. Mental status is at baseline.     Gait: Gait abnormal.     Comments: Oriented to person, place. Ambulates with walker.   Psychiatric:        Mood and Affect: Mood normal.        Behavior: Behavior normal.     Comments: Visual hallucination vs delusion, anxiety    Labs reviewed: Recent Labs    10/05/20 0000 11/22/20 1109 04/12/21 0005  NA 142 141 140  K 4.2 4.2 4.2  CL 107 104 104  CO2 25* 22 25*  GLUCOSE  --  104*  --   BUN 17 17 18   CREATININE 0.9 1.01* 0.9  CALCIUM 9.6 10.0 9.8   Recent Labs    10/05/20 0000 04/12/21 0005  AST 19 19  ALT 15 8  ALKPHOS 62 78  ALBUMIN 3.5 4.1   Recent Labs    08/04/20 0000 10/05/20 0000 04/12/21 0005  WBC  6.2 4.8 6.7  NEUTROABS 3,677 2,328.00 4,147.00  HGB 8.9* 9.6* 11.0*  HCT 28* 30*  34*  PLT 354 288 282   Lab Results  Component Value Date   TSH 1.62 04/12/2021   No results found for: HGBA1C Lab Results  Component Value Date   CHOL 105 10/05/2020   HDL 46 10/05/2020   LDLCALC 38 10/05/2020   TRIG 119 10/05/2020   CHOLHDL 2.4 07/14/2019    Significant Diagnostic Results in last 30 days:  No results found.  Assessment/Plan  Recurrent UTI Hx of rena lithiasis, recurrent UTI, urosepsis, takes Keflex for suppression therapy, f/u Urology. On and off dysuria/supra pubic area discomfort, urinary frequency,  UA C/S 05/23/21 P Aeruginosa >100,000c/ml susceptible to cephalosporins, ? Colonization, 05/26/21 placed on Cefdinir 300mg  bid x 7days. Observe for s/s of Dysuria and s/s of UTI 06/06/21 urine culture Ps. Aeruginosa >100,000c/ml, susceptible to Cipro 500mg  bid x 7days.   GERD (gastroesophageal reflux disease) takes Pantoprazole 40mg  qd, Famotidine 20mg  qd. yearly FOBT per GI  Parkinson's disease takes MiraPex 1mg  bid, Sinemet 25/100 II bid.  Depression, psychotic ([place]) stabilizing, takes Sertraline 75mg  qd, Quetiapine was increased to 25mg  qhs, 12.5mg  qam since 05/12/21  Chronic constipation takes Senokot S I qd, Amitiza 68mcg bid, Colace qd  Hypothyroidism takes Levothyroxine 51mcg qd. TSH 1.62 04/11/21  Erosive (osteo)arthritis takes Plaquenil 200mg  qd, Tylenol 500mg  bid, f/u Rheumatology.   Chronic diastolic CHF (congestive heart failure) (HCC) CHF/trace edema BLE, takes Furosemide 20mg  qd, cardiology, severe aortic valve stenosis. Bun/creat 18/0.89 04/11/21, Echocardiogram 11/15/20 EF 60-65%  Iron deficiency anemia takes Fe, Hgb 11 04/11/21  Aortic stenosis AS Cardiology palliative approach  Closed multiple fractures of left upper extremity with ribs with routine healing Closed fracture of multiple ribs of left side with routine healing, takes Tramadol, pain is better  controlled.   Family/ staff Communication: plan of care reviewed with the patient and charge nurse.   Labs/tests ordered:  none  Time spend 35 minutes.

## 2021-05-26 NOTE — Assessment & Plan Note (Signed)
takes MiraPex 1mg  bid, Sinemet 25/100 II bid.

## 2021-05-26 NOTE — Assessment & Plan Note (Signed)
takes Pantoprazole 40mg  qd, Famotidine 20mg  qd. yearly FOBT per GI

## 2021-05-26 NOTE — Assessment & Plan Note (Addendum)
Hx of rena lithiasis, recurrent UTI, urosepsis, takes Keflex for suppression therapy, f/u Urology. On and off dysuria/supra pubic area discomfort, urinary frequency,  UA C/S 05/23/21 P Aeruginosa >100,000c/ml susceptible to cephalosporins, ? Colonization, 05/26/21 placed on Cefdinir 300mg  bid x 7days. Observe for s/s of Dysuria and s/s of UTI 06/06/21 urine culture Ps. Aeruginosa >100,000c/ml, susceptible to Cipro 500mg  bid x 7days.

## 2021-05-26 NOTE — Assessment & Plan Note (Signed)
CHF/trace edema BLE, takes Furosemide 20mg  qd, cardiology, severe aortic valve stenosis. Bun/creat 18/0.89 04/11/21, Echocardiogram 11/15/20 EF 60-65%

## 2021-05-26 NOTE — Assessment & Plan Note (Signed)
takes Senokot S I qd, Amitiza 7mcg bid, Colace qd

## 2021-05-29 ENCOUNTER — Encounter: Payer: Self-pay | Admitting: Nurse Practitioner

## 2021-05-29 DIAGNOSIS — R2681 Unsteadiness on feet: Secondary | ICD-10-CM | POA: Diagnosis not present

## 2021-05-29 DIAGNOSIS — R29898 Other symptoms and signs involving the musculoskeletal system: Secondary | ICD-10-CM | POA: Diagnosis not present

## 2021-05-29 DIAGNOSIS — M25512 Pain in left shoulder: Secondary | ICD-10-CM | POA: Diagnosis not present

## 2021-05-29 DIAGNOSIS — M6281 Muscle weakness (generalized): Secondary | ICD-10-CM | POA: Diagnosis not present

## 2021-05-29 DIAGNOSIS — M25522 Pain in left elbow: Secondary | ICD-10-CM | POA: Diagnosis not present

## 2021-05-29 DIAGNOSIS — M79672 Pain in left foot: Secondary | ICD-10-CM | POA: Diagnosis not present

## 2021-05-30 DIAGNOSIS — M79672 Pain in left foot: Secondary | ICD-10-CM | POA: Diagnosis not present

## 2021-05-30 DIAGNOSIS — R29898 Other symptoms and signs involving the musculoskeletal system: Secondary | ICD-10-CM | POA: Diagnosis not present

## 2021-05-30 DIAGNOSIS — M6281 Muscle weakness (generalized): Secondary | ICD-10-CM | POA: Diagnosis not present

## 2021-05-30 DIAGNOSIS — M25512 Pain in left shoulder: Secondary | ICD-10-CM | POA: Diagnosis not present

## 2021-05-30 DIAGNOSIS — M25522 Pain in left elbow: Secondary | ICD-10-CM | POA: Diagnosis not present

## 2021-05-30 DIAGNOSIS — R2681 Unsteadiness on feet: Secondary | ICD-10-CM | POA: Diagnosis not present

## 2021-05-31 DIAGNOSIS — M6281 Muscle weakness (generalized): Secondary | ICD-10-CM | POA: Diagnosis not present

## 2021-05-31 DIAGNOSIS — M25512 Pain in left shoulder: Secondary | ICD-10-CM | POA: Diagnosis not present

## 2021-05-31 DIAGNOSIS — R29898 Other symptoms and signs involving the musculoskeletal system: Secondary | ICD-10-CM | POA: Diagnosis not present

## 2021-05-31 DIAGNOSIS — R2681 Unsteadiness on feet: Secondary | ICD-10-CM | POA: Diagnosis not present

## 2021-05-31 DIAGNOSIS — M79672 Pain in left foot: Secondary | ICD-10-CM | POA: Diagnosis not present

## 2021-05-31 DIAGNOSIS — M25522 Pain in left elbow: Secondary | ICD-10-CM | POA: Diagnosis not present

## 2021-06-02 DIAGNOSIS — R2681 Unsteadiness on feet: Secondary | ICD-10-CM | POA: Diagnosis not present

## 2021-06-02 DIAGNOSIS — M25512 Pain in left shoulder: Secondary | ICD-10-CM | POA: Diagnosis not present

## 2021-06-02 DIAGNOSIS — M6281 Muscle weakness (generalized): Secondary | ICD-10-CM | POA: Diagnosis not present

## 2021-06-02 DIAGNOSIS — M79672 Pain in left foot: Secondary | ICD-10-CM | POA: Diagnosis not present

## 2021-06-02 DIAGNOSIS — M25522 Pain in left elbow: Secondary | ICD-10-CM | POA: Diagnosis not present

## 2021-06-02 DIAGNOSIS — R29898 Other symptoms and signs involving the musculoskeletal system: Secondary | ICD-10-CM | POA: Diagnosis not present

## 2021-06-05 DIAGNOSIS — M6281 Muscle weakness (generalized): Secondary | ICD-10-CM | POA: Diagnosis not present

## 2021-06-05 DIAGNOSIS — R2681 Unsteadiness on feet: Secondary | ICD-10-CM | POA: Diagnosis not present

## 2021-06-05 DIAGNOSIS — R29898 Other symptoms and signs involving the musculoskeletal system: Secondary | ICD-10-CM | POA: Diagnosis not present

## 2021-06-05 DIAGNOSIS — M25522 Pain in left elbow: Secondary | ICD-10-CM | POA: Diagnosis not present

## 2021-06-05 DIAGNOSIS — M79672 Pain in left foot: Secondary | ICD-10-CM | POA: Diagnosis not present

## 2021-06-05 DIAGNOSIS — M25512 Pain in left shoulder: Secondary | ICD-10-CM | POA: Diagnosis not present

## 2021-06-06 DIAGNOSIS — N390 Urinary tract infection, site not specified: Secondary | ICD-10-CM | POA: Diagnosis not present

## 2021-06-07 DIAGNOSIS — M79672 Pain in left foot: Secondary | ICD-10-CM | POA: Diagnosis not present

## 2021-06-07 DIAGNOSIS — M6281 Muscle weakness (generalized): Secondary | ICD-10-CM | POA: Diagnosis not present

## 2021-06-07 DIAGNOSIS — M25522 Pain in left elbow: Secondary | ICD-10-CM | POA: Diagnosis not present

## 2021-06-07 DIAGNOSIS — R29898 Other symptoms and signs involving the musculoskeletal system: Secondary | ICD-10-CM | POA: Diagnosis not present

## 2021-06-07 DIAGNOSIS — R2681 Unsteadiness on feet: Secondary | ICD-10-CM | POA: Diagnosis not present

## 2021-06-07 DIAGNOSIS — M25512 Pain in left shoulder: Secondary | ICD-10-CM | POA: Diagnosis not present

## 2021-06-09 DIAGNOSIS — M25512 Pain in left shoulder: Secondary | ICD-10-CM | POA: Diagnosis not present

## 2021-06-09 DIAGNOSIS — R29898 Other symptoms and signs involving the musculoskeletal system: Secondary | ICD-10-CM | POA: Diagnosis not present

## 2021-06-09 DIAGNOSIS — M25522 Pain in left elbow: Secondary | ICD-10-CM | POA: Diagnosis not present

## 2021-06-09 DIAGNOSIS — M6281 Muscle weakness (generalized): Secondary | ICD-10-CM | POA: Diagnosis not present

## 2021-06-15 ENCOUNTER — Other Ambulatory Visit: Payer: Self-pay

## 2021-06-15 ENCOUNTER — Ambulatory Visit (INDEPENDENT_AMBULATORY_CARE_PROVIDER_SITE_OTHER): Payer: Medicare Other | Admitting: Podiatry

## 2021-06-15 DIAGNOSIS — G629 Polyneuropathy, unspecified: Secondary | ICD-10-CM

## 2021-06-15 DIAGNOSIS — M79674 Pain in right toe(s): Secondary | ICD-10-CM | POA: Diagnosis not present

## 2021-06-15 DIAGNOSIS — M79675 Pain in left toe(s): Secondary | ICD-10-CM | POA: Diagnosis not present

## 2021-06-15 DIAGNOSIS — B351 Tinea unguium: Secondary | ICD-10-CM

## 2021-06-20 NOTE — Progress Notes (Signed)
Subjective: 87 y.o. returns the office today for painful, elongated, thickened toenails which she cannot trim herself.  She presents today with her husband.  She was going to physical therapy and this was very helpful for the plan fasciitis symptoms that she is having that is resolved.  Her main concern is the tenderness today.  Denies any ulcerations.  No new concerns to repeat today.  PCP: [name], MD   Objective: AAO 3, NAD DP/PT pulses palpable, CRT less than 3 seconds Nails hypertrophic, dystrophic, elongated, brittle, discolored 10. There is tenderness overlying the nails 1-5 bilaterally except for the left hallux toenail does not present. There is no surrounding erythema or drainage along the nail sites. No open lesions or pre-ulcerative lesions are identified. No significant areas of tenderness today otherwise. No pain with calf compression, warmth, erythema.  Assessment: Patient presents with symptomatic onychomycosis  Plan: -Treatment options including alternatives, risks, complications were discussed -Nails sharply debrided 9 without complication/bleeding. -Continue home stretching, rehab exercises for plan fasciitis to help with family recurrence.  Trula Slade DPM

## 2021-06-22 DIAGNOSIS — F419 Anxiety disorder, unspecified: Secondary | ICD-10-CM | POA: Diagnosis not present

## 2021-06-22 DIAGNOSIS — R441 Visual hallucinations: Secondary | ICD-10-CM | POA: Diagnosis not present

## 2021-06-22 DIAGNOSIS — F323 Major depressive disorder, single episode, severe with psychotic features: Secondary | ICD-10-CM | POA: Diagnosis not present

## 2021-06-29 ENCOUNTER — Non-Acute Institutional Stay (SKILLED_NURSING_FACILITY): Payer: Medicare Other | Admitting: Orthopedic Surgery

## 2021-06-29 ENCOUNTER — Encounter: Payer: Self-pay | Admitting: Orthopedic Surgery

## 2021-06-29 DIAGNOSIS — I5032 Chronic diastolic (congestive) heart failure: Secondary | ICD-10-CM | POA: Diagnosis not present

## 2021-06-29 DIAGNOSIS — F323 Major depressive disorder, single episode, severe with psychotic features: Secondary | ICD-10-CM

## 2021-06-29 DIAGNOSIS — S2249XD Multiple fractures of ribs, unspecified side, subsequent encounter for fracture with routine healing: Secondary | ICD-10-CM

## 2021-06-29 DIAGNOSIS — N390 Urinary tract infection, site not specified: Secondary | ICD-10-CM

## 2021-06-29 DIAGNOSIS — E039 Hypothyroidism, unspecified: Secondary | ICD-10-CM

## 2021-06-29 DIAGNOSIS — G20 Parkinson's disease: Secondary | ICD-10-CM

## 2021-06-29 DIAGNOSIS — K5909 Other constipation: Secondary | ICD-10-CM | POA: Diagnosis not present

## 2021-06-29 DIAGNOSIS — D508 Other iron deficiency anemias: Secondary | ICD-10-CM | POA: Diagnosis not present

## 2021-06-29 DIAGNOSIS — S42302D Unspecified fracture of shaft of humerus, left arm, subsequent encounter for fracture with routine healing: Secondary | ICD-10-CM | POA: Diagnosis not present

## 2021-06-29 DIAGNOSIS — M154 Erosive (osteo)arthritis: Secondary | ICD-10-CM

## 2021-06-29 DIAGNOSIS — K219 Gastro-esophageal reflux disease without esophagitis: Secondary | ICD-10-CM | POA: Diagnosis not present

## 2021-06-29 NOTE — Progress Notes (Signed)
Location:   [place] Room Number: [place] of Service:  SNF [phone]) Provider:  Windell Moulding, NP    Patient Care Team: [name], MD as PCP - General (Internal Medicine) [name], MD as PCP - Cardiology (Cardiology) [name], MD (Neurosurgery) [name], MD (Neurology) [name], MD (Inactive) as Consulting Physician (Gastroenterology) [name], MD as Consulting Physician (Urology) [name], Man X, NP as Nurse Practitioner (Internal Medicine)  Extended Emergency Contact Information Primary Emergency Contact: [facility] Address: [address]          [place], [place] [address] [name] of [place] Phone: [phone] Mobile Phone: [phone] Relation: Spouse Secondary Emergency Contact: [name] Mobile Phone: [phone] Relation: Daughter Preferred language: [name] Interpreter needed? No  Code Status:  DNR Goals of care: Advanced Directive information Advanced Directives 06/29/2021  Does Patient Have a Medical Advance Directive? Yes  Type of Advance Directive Living will;Out of facility DNR (pink MOST or yellow form)  Does patient want to make changes to medical advance directive? No - Patient declined  Copy of [place] in Chart? -  Would patient like information on creating a medical advance directive? -  Pre-existing out of facility DNR order (yellow form or pink MOST form) Yellow form placed in chart (order not valid for inpatient use)     Chief Complaint  Patient presents with   Medical Management of Chronic Issues    Routine follow up. Patient complains of dysuria.     HPI:  Pt is a 87 y.o. female seen today for medical management of chronic diseases.    She currently resides on the skilled nursing unit at [facility]. Past medical history includes: aortic stenosis, CHF, HTN, PVD, RBBB, barrett esophagus, constipation, GERD, hypothyroidism,  parkinson's, OA, osteoporosis, CKD stage III, chronic back pain, unstable gait, iron deficiency anemia, and memory loss.    Recurrent UTI- c/o dysuria today, Hx: renal lithiasis, recurrent UTI and urosepsis, urine culture 05/23/21 P. Aeruginosa > 100,000c/ml, given cefdinir 300 mg po bid x 7 days.  CHF- Echo EF 60-65%, BUN/creat 18/0.9 04/12/2021, furosemide 20 meq daily and prn Parkinson's- mirapex 1 mg daily, sinemet 25/100 bid, followed by neurology f/u 07/03/2021 Hypothyroidism- denies increased fatigue, TSH 1.62 04/12/2021 GERD- hx: barretts esophagus, denies reflux today, pantoprazole 40 mg daily, famotidine 20 mg daily. Yearly FOBT per GI, Hgb 11.0 04/12/2021 OA- hx: erosive OA, followed by rheumatology, s/p RTH, plaquenil 200 mg daily, tylenol 500 mg bid, RF < 14 02/11/2021 Iron deficiency anemia- hemoglobin 11 04/12/2021, ferrous sulfate 142 mg daily Depression/ hallucinations- reports the government will not let her live with her husband, no recent behavioral outbursts, zoloft 50 mg daily, seroquel 25 mg daily.   No recent falls or injuries. 05/22 Chest xray revealed acute nondisplaced fracture at left 7th and 8th ribs. Does not report left sided pain but chronic back pain. Remains on tramadol 50 mg tid. Ambulates with wheelchair/walker.   Recent blood pressures:  07/20- 105/64  07/13- 110/68  07/06- 100/65  Recent weights:  07/20- 131.4 lbs  07/19- 132.8 lbs  07/18- 133.6 lbs  07/17- 131.6 lbs  Nurse does not report any concerns, vitals stable.      Past Medical History:  Diagnosis Date   Abnormal nuclear stress test    Actinic cheilitis 04/23/2019   Anemia    iron deficiency    Anxiety    Aortic stenosis    severe by echo  2021    Arthritis    Broken arm    right    Chronic diastolic CHF (congestive heart failure) (HCC)    Chronic pain    Cognitive communication deficit    Constipation    Depression    Erosive (osteo)arthritis 03/09/2020   Family history of  adverse reaction to anesthesia    pt. states sister vomits   Fibromyalgia    GERD (gastroesophageal reflux disease)    H/O hiatal hernia    Hallucinations, visual 07/21/2018   03/10/20 psych consult.    Hematuria 05/18/2020   History of bronchitis    History of kidney stones    Hypercholesterolemia    Hypertension    dr t turner   Hypothyroidism    Memory loss 04/21/2018   08/08/18 CT head no traumatic findings, atrophy.  09/27/19 MMSE 25/30, failed the clock drawing   Microhematuria 03/24/2015   Neuropathy    Osteoporosis    Parkinson disease ([place])    PVD (peripheral vascular disease) (HCC)    99% stenosis of left anteiror tibial artery, mod stenosis of left distal SFA and popliteal artery followed by [name]   RBBB    noted on EKG 2018   Restless leg syndrome    Sepsis ([place])    hx of due to Ecoli   Septic shock ([place]) 04/19/2020   Shortness of breath    with exertion - chronic    Sjogren's disease ([place])    Sjogren's syndrome ([place]) 02/04/2018   04/16/19 rheumatology: Erosive OA of hands, Sjogrens syndrome, low back pain at multiple sites, recurrent kidney stones, age related osteoporosis w/o current pathological fracture, f/u 6 months.    SOB (shortness of breath)    chronic due to diastolic dysfunction, deconditioning, obesity   Spinal stenosis    lumbar region    Tremor    Unstable gait 04/21/2018   Unsteadiness on feet    Urinary frequency 09/18/2018   Urinary tract infection    Visual hallucinations    Past Surgical History:  Procedure Laterality Date   ABDOMINAL HYSTERECTOMY     BACK SURGERY     4 back surgeries,   lumbar fusion   CARDIAC CATHETERIZATION  2006   normal   CYSTOSCOPY/URETEROSCOPY/HOLMIUM LASER/STENT PLACEMENT Left 01/06/2019   Procedure: CYSTOSCOPY/RETROGRADE/URETEROSCOPY/HOLMIUM LASER/BASKET RETRIEVAL/STENT PLACEMENT;  Surgeon: Ceasar Mons, MD;  Location: [facility];  Service: Urology;  Laterality: Left;   CYSTOSCOPY/URETEROSCOPY/HOLMIUM  LASER/STENT PLACEMENT Bilateral 05/24/2020   Procedure: CYSTOSCOPY/RETROGRADE/URETEROSCOPY/HOLMIUM LASER/STENT PLACEMENT;  Surgeon: Ceasar Mons, MD;  Location: [facility];  Service: Urology;  Laterality: Bilateral;   EYE SURGERY Bilateral    cateracts   falls     variious fall, broken wrist,and toes   FRACTURE SURGERY Right April 2016   Wrist, Pt. fell   IR NEPHROSTOMY PLACEMENT RIGHT  04/19/2020   kidney stone removal Left 01/20/2019   RIGHT/LEFT HEART CATH AND CORONARY ANGIOGRAPHY N/A 02/05/2018   Procedure: RIGHT/LEFT HEART CATH AND CORONARY ANGIOGRAPHY;  Surgeon: Troy Sine, MD;  Location: [facility];  Service: Cardiovascular;  Laterality: N/A;   SPINAL CORD STIMULATOR INSERTION N/A 09/09/2015   Procedure: LUMBAR SPINAL CORD STIMULATOR INSERTION;  Surgeon: Clydell Hakim, MD;  Location: [facility];  Service: Neurosurgery;  Laterality: N/A;  LUMBAR SPINAL CORD STIMULATOR INSERTION   SPINE SURGERY  April 2013   Back X's 4   TOTAL HIP ARTHROPLASTY     right   WRIST FRACTURE SURGERY Bilateral     Allergies  Allergen Reactions   Adrenalone    Lactose Other (See Comments)    abd pain, lactose intolerant   Morphine Other (See Comments)   Sulfa Antibiotics Other (See Comments)    Headache, very sick   Codeine Other (See Comments)    Reaction:  Headaches and nightmares    Lactose Intolerance (Gi) Nausea And Vomiting   Latex Rash   Lyrica [Pregabalin] Swelling and Other (See Comments)    Reaction:  Leg swelling   Other Other (See Comments)    Pt states that pain medications give her nightmares.     Plaquenil [Hydroxychloroquine Sulfate] Other (See Comments)    Reaction:  GI upset    Reglan [Metoclopramide] Other (See Comments)    Reaction:  GI upset    Requip [Ropinirole Hcl] Other (See Comments)    Reaction:  GI upset    Septra [Sulfamethoxazole-Trimethoprim] Nausea And Vomiting   Shellfish Allergy Nausea And Vomiting    Allergies as of 06/29/2021        Reactions   Adrenalone    Lactose Other (See Comments)   abd pain, lactose intolerant   Morphine Other (See Comments)   Sulfa Antibiotics Other (See Comments)   Headache, very sick   Codeine Other (See Comments)   Reaction:  Headaches and nightmares    Lactose Intolerance (gi) Nausea And Vomiting   Latex Rash   Lyrica [pregabalin] Swelling, Other (See Comments)   Reaction:  Leg swelling   Other Other (See Comments)   Pt states that pain medications give her nightmares.     Plaquenil [hydroxychloroquine Sulfate] Other (See Comments)   Reaction:  GI upset    Reglan [metoclopramide] Other (See Comments)   Reaction:  GI upset    Requip [ropinirole Hcl] Other (See Comments)   Reaction:  GI upset    Septra [sulfamethoxazole-trimethoprim] Nausea And Vomiting   Shellfish Allergy Nausea And Vomiting        Medication List        Accurate as of June 29, 2021 10:03 AM. If you have any questions, ask your nurse or doctor.          STOP taking these medications    cephALEXin 250 MG capsule Commonly known as: KEFLEX Stopped by: Yvonna Alanis, NP   lovastatin 40 MG tablet Commonly known as: MEVACOR Stopped by: Yvonna Alanis, NP       TAKE these medications    acetaminophen 500 MG tablet Commonly known as: TYLENOL Take 500 mg by mouth 3 (three) times daily as needed for mild pain. not to exceed 3 gm in 24 hours. As needed for arthritis pain   AeroChamber MV inhaler by Other route. Use as instructed as needed   aspirin EC 81 MG tablet Take 81 mg by mouth daily.   Calcium 600/Vitamin D3 600-800 MG-UNIT Tabs Generic drug: Calcium Carb-Cholecalciferol Take 1 tablet by mouth daily.   carbidopa-levodopa 25-100 MG tablet Commonly known as: SINEMET IR Take 2 tablets by mouth in the morning and at bedtime.   docusate sodium 100 MG capsule Commonly known as: COLACE Take 100 mg by mouth at bedtime.   eucerin cream Apply 1 application topically daily.   famotidine 20 MG  tablet Commonly known as: PEPCID Take 20 mg by mouth daily.   furosemide 20 MG tablet Commonly known as: LASIX Take 20 mg by mouth daily as needed.   furosemide 20 MG tablet Commonly known as: LASIX Take 1 tablet (20 mg total) by mouth daily.  hydroxychloroquine 200 MG tablet Commonly known as: PLAQUENIL Take 200 mg by mouth daily.   lactose free nutrition Liqd Take 237 mLs by mouth as needed.   levothyroxine 50 MCG tablet Commonly known as: SYNTHROID Take 50 mcg by mouth daily before breakfast.   lubiprostone 24 MCG capsule Commonly known as: AMITIZA Take 24 mcg by mouth 2 (two) times daily with a meal.   MULTIPLE MINERALS-VITAMINS PO Take 1 tablet by mouth daily.   nystatin powder Commonly known as: MYCOSTATIN/NYSTOP Apply topically 2 (two) times daily as needed.   pantoprazole 40 MG tablet Commonly known as: PROTONIX Take 40 mg by mouth daily.   potassium citrate 10 MEQ (1080 MG) SR tablet Commonly known as: UROCIT-K Take 10 mEq by mouth 2 (two) times daily.   pramipexole 1 MG tablet Commonly known as: MIRAPEX Take 1 mg by mouth 2 (two) times daily.   PRESERVISION AREDS 2 PO Take 1 tablet by mouth daily.   QUEtiapine 25 MG tablet Commonly known as: SEROQUEL Take 25 mg by mouth daily.   rosuvastatin 20 MG tablet Commonly known as: CRESTOR Take 20 mg by mouth daily.   saccharomyces boulardii 250 MG capsule Commonly known as: FLORASTOR Take 250 mg by mouth daily.   senna-docusate 8.6-50 MG tablet Commonly known as: Senokot-S Take 1 tablet by mouth at bedtime.   sertraline 50 MG tablet Commonly known as: ZOLOFT Take 75 mg by mouth at bedtime. 1-1/2 tablets to = 75 mg   Slow Fe 142 (45 Fe) MG Tbcr Generic drug: Ferrous Sulfate Take 1 tablet by mouth. Daily   Systane 0.4-0.3 % Soln Generic drug: Polyethyl Glycol-Propyl Glycol Apply 1 drop to eye 2 (two) times daily as needed (dry eyes).   traMADol 50 MG tablet Commonly known as: ULTRAM Take  1 tablet (50 mg total) by mouth 3 (three) times daily. What changed: Another medication with the same name was removed. Continue taking this medication, and follow the directions you see here. Changed by: Yvonna Alanis, NP   Vitamin D 50 MCG (2000 UT) tablet Take 2,000 Units by mouth daily.        Review of Systems  Constitutional:  Negative for activity change, appetite change, fatigue and fever.  HENT:  Positive for hearing loss. Negative for trouble swallowing.   Eyes:  Positive for visual disturbance.       Glasses  Respiratory:  Negative for cough, shortness of breath and wheezing.   Cardiovascular:  Positive for leg swelling. Negative for chest pain.  Gastrointestinal:  Positive for constipation. Negative for abdominal distention, abdominal pain, diarrhea and nausea.  Genitourinary:  Positive for dysuria and frequency. Negative for hematuria.  Musculoskeletal:  Positive for arthralgias, back pain, gait problem, joint swelling and myalgias.  Skin: Negative.   Neurological:  Positive for tremors and weakness. Negative for dizziness and headaches.  Psychiatric/Behavioral:  Positive for confusion, dysphoric mood and hallucinations. Negative for sleep disturbance. The patient is not nervous/anxious.    Immunization History  Administered Date(s) Administered   Hepatitis A, Adult 03/01/1997, 09/20/1997   Influenza Split 09/09/2013, 09/27/2014   Influenza, High Dose Seasonal PF 08/01/2011, 08/27/2012, 10/14/2013, 09/08/2015, 09/10/2016, 09/12/2017, 09/12/2018, 09/15/2019   Influenza,inj,Quad PF,6+ Mos 09/11/2016   Influenza-Unspecified 09/21/2020   Moderna Sars-Covid-2 Vaccination 12/12/2019, 01/09/2020, 10/18/2020   Pneumococcal Conjugate-13 04/15/2014   Pneumococcal Polysaccharide-23 09/28/2005   Td 06/20/2016   Tdap 04/29/2006, 09/01/2015   Zoster Recombinat (Shingrix) 06/11/2018, 09/10/2018   Zoster, Live 04/29/2006   Pertinent  Health Maintenance  Due  Topic Date Due    INFLUENZA VACCINE  07/10/2021   DEXA SCAN  Completed   PNA vac Low Risk Adult  Completed   Fall Risk  11/16/2019 04/23/2019 04/09/2019 10/29/2018 10/27/2018  Falls in the past year? 0 0 0 0 1  Comment - - - fell June 2019 -  Number falls in past yr: - 0 0 - 0  Injury with Fall? - 0 0 - 1  Comment - - - - -  Risk for fall due to : - - - - -  Risk for fall due to: Comment - - - - -   Functional Status Survey:    Vitals:   06/29/21 0952  BP: 105/64  Pulse: 91  Resp: 18  Temp: 98.1 F (36.7 C)  SpO2: 97%  Weight: 131 lb 6.4 oz (59.6 kg)  Height: 4\' 10"  (1.473 m)   Body mass index is 27.46 kg/m. Physical Exam Vitals reviewed.  Constitutional:      General: She is not in acute distress. HENT:     Head: Normocephalic.     Right Ear: There is no impacted cerumen.     Left Ear: There is no impacted cerumen.     Nose: Nose normal.     Mouth/Throat:     Mouth: Mucous membranes are moist.  Eyes:     General:        Right eye: No discharge.        Left eye: No discharge.  Neck:     Vascular: No carotid bruit.  Cardiovascular:     Rate and Rhythm: Normal rate and regular rhythm.     Pulses: Normal pulses.     Heart sounds: Murmur heard.  Pulmonary:     Effort: Pulmonary effort is normal. No respiratory distress.     Breath sounds: Normal breath sounds. No wheezing.  Abdominal:     General: Bowel sounds are normal. There is no distension.     Palpations: Abdomen is soft.     Tenderness: There is no abdominal tenderness.  Musculoskeletal:     Cervical back: Normal range of motion.     Right lower leg: Edema present.     Left lower leg: Edema present.     Comments: Non-pitting  Lymphadenopathy:     Cervical: No cervical adenopathy.  Skin:    General: Skin is warm and dry.     Capillary Refill: Capillary refill takes less than 2 seconds.  Neurological:     General: No focal deficit present.     Mental Status: She is alert. Mental status is at baseline.     Motor:  Weakness present.     Gait: Gait abnormal.     Comments: Wheelchair/walker  Psychiatric:        Mood and Affect: Mood normal.        Behavior: Behavior normal.        Cognition and Memory: Memory is impaired.    Labs reviewed: Recent Labs    10/05/20 0000 11/22/20 1109 04/12/21 0005  NA 142 141 140  K 4.2 4.2 4.2  CL 107 104 104  CO2 25* 22 25*  GLUCOSE  --  104*  --   BUN 17 17 18   CREATININE 0.9 1.01* 0.9  CALCIUM 9.6 10.0 9.8   Recent Labs    10/05/20 0000 04/12/21 0005  AST 19 19  ALT 15 8  ALKPHOS 62 78  ALBUMIN 3.5 4.1   Recent Labs  08/04/20 0000 10/05/20 0000 04/12/21 0005  WBC 6.2 4.8 6.7  NEUTROABS 3,677 2,328.00 4,147.00  HGB 8.9* 9.6* 11.0*  HCT 28* 30* 34*  PLT 354 288 282   Lab Results  Component Value Date   TSH 1.62 04/12/2021   No results found for: HGBA1C Lab Results  Component Value Date   CHOL 105 10/05/2020   HDL 46 10/05/2020   LDLCALC 38 10/05/2020   TRIG 119 10/05/2020   CHOLHDL 2.4 07/14/2019    Significant Diagnostic Results in last 30 days:  No results found.  Assessment/Plan 1. Recurrent UTI - reports dysuria today - urine culture 05/23/21 P. Aeruginosa > 100,000c/ml, given cefdinir 300 mg po bid x 7 days - UA and urine culture  2. Chronic diastolic CHF (congestive heart failure) (HCC) - no weight fluctuations sob, non-pitting ankle edema - cont furosemide 20 mg daily  3. Parkinson's disease ([place]) - followed by neurology- next f/u 07/03/21 - cont Mirapex 1 mg bid - cont sinemet 25-100 mg bid  4. Acquired hypothyroidism - TSH 1.64 04/12/2021 - cont levothyroxine 50 mcg daily  5. Gastroesophageal reflux disease, unspecified whether esophagitis present - stable with Protonix 40 mg and Pepcid 20 mg daily - hgb 11.0 04/12/2021 - FOBT yearly per GI  6. Erosive (osteo)arthritis - followed by rheumatology - RF < 14 02/11/2021 - cont plaquenil 200 mg daily - cont tylenol 500 mg bid - cont tramadol 50 mg tid    7. Iron deficiency anemia secondary to inadequate dietary iron intake - hgb 11 04/12/2021 - cont ferrous sulfate 142 mg daily  8. Depression, psychotic ([place]) - very pleasant, no recent behavioral issues - cont Zoloft 50 mg daily - cont Seroquel 25 mg daily  9. Chronic constipation - stable with senna  10. Closed multiple fractures of left upper extremity with ribs with routine healing - denies left sided pain - cont falls safety precautions    Family/ staff Communication: plan discussed with patient, husband, nurse  Labs/tests ordered:  UA and urine culture

## 2021-06-30 DIAGNOSIS — R300 Dysuria: Secondary | ICD-10-CM | POA: Diagnosis not present

## 2021-07-05 ENCOUNTER — Non-Acute Institutional Stay (SKILLED_NURSING_FACILITY): Payer: Medicare Other | Admitting: Nurse Practitioner

## 2021-07-05 ENCOUNTER — Encounter: Payer: Self-pay | Admitting: Nurse Practitioner

## 2021-07-05 DIAGNOSIS — K219 Gastro-esophageal reflux disease without esophagitis: Secondary | ICD-10-CM

## 2021-07-05 DIAGNOSIS — K5909 Other constipation: Secondary | ICD-10-CM

## 2021-07-05 DIAGNOSIS — G20 Parkinson's disease: Secondary | ICD-10-CM | POA: Diagnosis not present

## 2021-07-05 DIAGNOSIS — D508 Other iron deficiency anemias: Secondary | ICD-10-CM

## 2021-07-05 DIAGNOSIS — N390 Urinary tract infection, site not specified: Secondary | ICD-10-CM

## 2021-07-05 DIAGNOSIS — M154 Erosive (osteo)arthritis: Secondary | ICD-10-CM | POA: Diagnosis not present

## 2021-07-05 DIAGNOSIS — I350 Nonrheumatic aortic (valve) stenosis: Secondary | ICD-10-CM | POA: Diagnosis not present

## 2021-07-05 DIAGNOSIS — E039 Hypothyroidism, unspecified: Secondary | ICD-10-CM

## 2021-07-05 DIAGNOSIS — F323 Major depressive disorder, single episode, severe with psychotic features: Secondary | ICD-10-CM

## 2021-07-05 DIAGNOSIS — I5032 Chronic diastolic (congestive) heart failure: Secondary | ICD-10-CM

## 2021-07-05 DIAGNOSIS — G20A1 Parkinson's disease without dyskinesia, without mention of fluctuations: Secondary | ICD-10-CM

## 2021-07-05 NOTE — Progress Notes (Signed)
Location:   [facility] Room Number: [phone] Place of Service:  SNF [phone]) Provider:  Etha Stambaugh Otho Darner, NP  [name], MD  Patient Care Team: [name], MD as PCP - General (Internal Medicine) [name], MD as PCP - Cardiology (Cardiology) [name], MD (Neurosurgery) [name], MD (Neurology) [name], MD (Inactive) as Consulting Physician (Gastroenterology) [name], MD as Consulting Physician (Urology) Thai Hemrick X, NP as Nurse Practitioner (Internal Medicine)  Extended Emergency Contact Information Primary Emergency Contact: [facility] Address: [address]          [place], [place] [address] [name] of [place] Phone: [phone] Mobile Phone: [phone] Relation: Spouse Secondary Emergency Contact: [name] Mobile Phone: [phone] Relation: Daughter Preferred language: [name] Interpreter needed? No  Code Status:  DNR Goals of care: Advanced Directive information Advanced Directives 07/05/2021  Does Patient Have a Medical Advance Directive? Yes  Type of Advance Directive Living will;Out of facility DNR (pink MOST or yellow form)  Does patient want to make changes to medical advance directive? No - Patient declined  Copy of [place] in Chart? -  Would patient like information on creating a medical advance directive? -  Pre-existing out of facility DNR order (yellow form or pink MOST form) Yellow form placed in chart (order not valid for inpatient use)     Chief Complaint  Patient presents with   Medical Management of Chronic Issues    Routine follow up     HPI:  Pt is a 87 y.o. female seen today for medical management of chronic diseases.        Closed fracture of multiple ribs of left side with routine healing, takes Tramadol, pain is better controlled.             Hx of rena lithiasis, recurrent UTI, urosepsis, off Keflex for suppression therapy,  f/u Urology. On and off dysuria, UA C/S 05/23/21 P Aeruginosa >100,000c/ml susceptible to cephalosporins, ? Colonization, 05/26/21 placed on Cefdinir '300mg'$  bid x 7days. Urine culture 06/30/21 showed PS Aeruginosa >100,000c/ml, susceptible to Imipenem, Gentamicin, Tobromycin-pending antibiotic of choice per Dr. Lyndel Safe.              GERD, takes Pantoprazole '40mg'$  qd, Famotidine '20mg'$  qd. yearly FOBT per GI, Hgb 11.0 04/12/21             Parkinson's, takes MiraPex '1mg'$  bid, Sinemet 25/100 II bid.             Depression/hallucination, stabilizing, takes Sertraline '75mg'$  qd, Quetiapine was increased to '25mg'$  qhs, 12.'5mg'$  qam since 05/12/21             Constipation, takes Senokot S I qd, Amitiza 24mg bid, Colace qd             Hypothyroidism, takes Levothyroxine 523m qd. TSH 1.62 04/11/21             Erosive arthritis, takes Plaquenil '200mg'$  qd, Tylenol  f/u Rheumatology.              CHF/trace edema BLE, takes Furosemide '20mg'$  qd, cardiology, severe aortic valve stenosis. Bun/creat 18/0.89 04/11/21, Echocardiogram 11/15/20 EF 60-65%             Anemia, takes Fe, Hgb 11 04/11/21             AS Cardiology palliative approach Past Medical History:  Diagnosis Date   Abnormal nuclear stress test    Actinic cheilitis 04/23/2019  Anemia    iron deficiency    Anxiety    Aortic stenosis    severe by echo 2021    Arthritis    Broken arm    right    Chronic diastolic CHF (congestive heart failure) ([place])    Chronic pain    Cognitive communication deficit    Constipation    Depression    Erosive (osteo)arthritis 03/09/2020   Family history of adverse reaction to anesthesia    pt. states sister vomits   Fibromyalgia    GERD (gastroesophageal reflux disease)    H/O hiatal hernia    Hallucinations, visual 07/21/2018   03/10/20 psych consult.    Hematuria 05/18/2020   History of bronchitis    History of kidney stones    Hypercholesterolemia    Hypertension    dr t turner   Hypothyroidism    Memory loss 04/21/2018   08/08/18  CT head no traumatic findings, atrophy.  09/27/19 MMSE 25/30, failed the clock drawing   Microhematuria 03/24/2015   Neuropathy    Osteoporosis    Parkinson disease ([place])    PVD (peripheral vascular disease) (H[facility])    99% stenosis of left anteiror tibial artery, mod stenosis of left distal SFA and popliteal artery followed by [name]   RBBB    noted on EKG 2018   Restless leg syndrome    Sepsis ([place])    hx of due to Ecoli   Septic shock ([place]) 04/19/2020   Shortness of breath    with exertion - chronic    Sjogren's disease ([place])    Sjogren's syndrome ([place]) 02/04/2018   04/16/19 rheumatology: Erosive OA of hands, Sjogrens syndrome, low back pain at multiple sites, recurrent kidney stones, age related osteoporosis w/o current pathological fracture, f/u 6 months.    SOB (shortness of breath)    chronic due to diastolic dysfunction, deconditioning, obesity   Spinal stenosis    lumbar region    Tremor    Unstable gait 04/21/2018   Unsteadiness on feet    Urinary frequency 09/18/2018   Urinary tract infection    Visual hallucinations    Past Surgical History:  Procedure Laterality Date   ABDOMINAL HYSTERECTOMY     BACK SURGERY     4 back surgeries,   lumbar fusion   CARDIAC CATHETERIZATION  2006   normal   CYSTOSCOPY/URETEROSCOPY/HOLMIUM LASER/STENT PLACEMENT Left 01/06/2019   Procedure: CYSTOSCOPY/RETROGRADE/URETEROSCOPY/HOLMIUM LASER/BASKET RETRIEVAL/STENT PLACEMENT;  Surgeon: Ceasar Mons, MD;  Location: [facility];  Service: Urology;  Laterality: Left;   CYSTOSCOPY/URETEROSCOPY/HOLMIUM LASER/STENT PLACEMENT Bilateral 05/24/2020   Procedure: CYSTOSCOPY/RETROGRADE/URETEROSCOPY/HOLMIUM LASER/STENT PLACEMENT;  Surgeon: Ceasar Mons, MD;  Location: [facility];  Service: Urology;  Laterality: Bilateral;   EYE SURGERY Bilateral    cateracts   falls     variious fall, broken wrist,and toes   FRACTURE SURGERY Right April 2016   Wrist, Pt. fell   IR NEPHROSTOMY PLACEMENT  RIGHT  04/19/2020   kidney stone removal Left 01/20/2019   RIGHT/LEFT HEART CATH AND CORONARY ANGIOGRAPHY N/A 02/05/2018   Procedure: RIGHT/LEFT HEART CATH AND CORONARY ANGIOGRAPHY;  Surgeon: Troy Sine, MD;  Location: [facility];  Service: Cardiovascular;  Laterality: N/A;   SPINAL CORD STIMULATOR INSERTION N/A 09/09/2015   Procedure: LUMBAR SPINAL CORD STIMULATOR INSERTION;  Surgeon: Clydell Hakim, MD;  Location: [facility];  Service: Neurosurgery;  Laterality: N/A;  LUMBAR SPINAL CORD STIMULATOR INSERTION   SPINE SURGERY  April 2013   Back X's 4  TOTAL HIP ARTHROPLASTY     right   WRIST FRACTURE SURGERY Bilateral     Allergies  Allergen Reactions   Adrenalone    Lactose Other (See Comments)    abd pain, lactose intolerant   Morphine Other (See Comments)   Sulfa Antibiotics Other (See Comments)    Headache, very sick   Codeine Other (See Comments)    Reaction:  Headaches and nightmares    Lactose Intolerance (Gi) Nausea And Vomiting   Latex Rash   Lyrica [Pregabalin] Swelling and Other (See Comments)    Reaction:  Leg swelling   Other Other (See Comments)    Pt states that pain medications give her nightmares.     Plaquenil [Hydroxychloroquine Sulfate] Other (See Comments)    Reaction:  GI upset    Reglan [Metoclopramide] Other (See Comments)    Reaction:  GI upset    Requip [Ropinirole Hcl] Other (See Comments)    Reaction:  GI upset    Septra [Sulfamethoxazole-Trimethoprim] Nausea And Vomiting   Shellfish Allergy Nausea And Vomiting    Allergies as of 07/05/2021       Reactions   Adrenalone    Lactose Other (See Comments)   abd pain, lactose intolerant   Morphine Other (See Comments)   Sulfa Antibiotics Other (See Comments)   Headache, very sick   Codeine Other (See Comments)   Reaction:  Headaches and nightmares    Lactose Intolerance (gi) Nausea And Vomiting   Latex Rash   Lyrica [pregabalin] Swelling, Other (See Comments)   Reaction:  Leg  swelling   Other Other (See Comments)   Pt states that pain medications give her nightmares.     Plaquenil [hydroxychloroquine Sulfate] Other (See Comments)   Reaction:  GI upset    Reglan [metoclopramide] Other (See Comments)   Reaction:  GI upset    Requip [ropinirole Hcl] Other (See Comments)   Reaction:  GI upset    Septra [sulfamethoxazole-trimethoprim] Nausea And Vomiting   Shellfish Allergy Nausea And Vomiting        Medication List        Accurate as of July 05, 2021 11:59 PM. If you have any questions, ask your nurse or doctor.          STOP taking these medications    aspirin EC 81 MG tablet Stopped by: Lainie Daubert X Camesha Farooq, NP   carbidopa-levodopa 25-100 MG tablet Commonly known as: SINEMET IR Stopped by: Marrell Dicaprio X Grantley Savage, NP   lactose free nutrition Liqd Stopped by: Mikhael Hendriks X Zoee Heeney, NP       TAKE these medications    acetaminophen 500 MG tablet Commonly known as: TYLENOL Take 500 mg by mouth 3 (three) times daily as needed for mild pain. not to exceed 3 gm in 24 hours. As needed for arthritis pain   AeroChamber MV inhaler by Other route. Use as instructed as needed   Calcium 600/Vitamin D3 600-800 MG-UNIT Tabs Generic drug: Calcium Carb-Cholecalciferol Take 1 tablet by mouth daily.   docusate sodium 100 MG capsule Commonly known as: COLACE Take 100 mg by mouth at bedtime.   eucerin cream Apply 1 application topically daily.   famotidine 20 MG tablet Commonly known as: PEPCID Take 20 mg by mouth daily.   furosemide 20 MG tablet Commonly known as: LASIX Take 20 mg by mouth daily as needed.   furosemide 20 MG tablet Commonly known as: LASIX Take 1 tablet (20 mg total) by mouth daily.   hydroxychloroquine 200 MG tablet  Commonly known as: PLAQUENIL Take 200 mg by mouth daily.   levothyroxine 50 MCG tablet Commonly known as: SYNTHROID Take 50 mcg by mouth daily before breakfast.   lubiprostone 24 MCG capsule Commonly known as: AMITIZA Take 24 mcg by  mouth 2 (two) times daily with a meal.   MULTIPLE MINERALS-VITAMINS PO Take 1 tablet by mouth daily.   nystatin powder Commonly known as: MYCOSTATIN/NYSTOP Apply topically 2 (two) times daily as needed.   pantoprazole 40 MG tablet Commonly known as: PROTONIX Take 40 mg by mouth daily.   potassium citrate 10 MEQ (1080 MG) SR tablet Commonly known as: UROCIT-K Take 10 mEq by mouth 2 (two) times daily.   pramipexole 1 MG tablet Commonly known as: MIRAPEX Take 1 mg by mouth 2 (two) times daily.   PRESERVISION AREDS 2 PO Take 1 tablet by mouth daily.   QUEtiapine 25 MG tablet Commonly known as: SEROQUEL Take 25 mg by mouth daily.   rosuvastatin 20 MG tablet Commonly known as: CRESTOR Take 20 mg by mouth daily.   saccharomyces boulardii 250 MG capsule Commonly known as: FLORASTOR Take 250 mg by mouth daily.   senna-docusate 8.6-50 MG tablet Commonly known as: Senokot-S Take 1 tablet by mouth at bedtime.   sertraline 50 MG tablet Commonly known as: ZOLOFT Take 75 mg by mouth at bedtime. 1-1/2 tablets to = 75 mg   Slow Fe 142 (45 Fe) MG Tbcr Generic drug: Ferrous Sulfate Take 1 tablet by mouth. Daily   Systane 0.4-0.3 % Soln Generic drug: Polyethyl Glycol-Propyl Glycol Apply 1 drop to eye 2 (two) times daily as needed (dry eyes).   traMADol 50 MG tablet Commonly known as: ULTRAM Take 1 tablet (50 mg total) by mouth 3 (three) times daily.   Vitamin D 50 MCG (2000 UT) tablet Take 2,000 Units by mouth daily.        Review of Systems  Constitutional:  Negative for activity change, appetite change and fever.  HENT:  Positive for hearing loss. Negative for congestion and voice change.   Eyes:  Negative for visual disturbance.  Respiratory:  Positive for shortness of breath. Negative for cough.        Chronic DOE  Cardiovascular:  Positive for leg swelling.  Gastrointestinal:  Negative for abdominal pain, constipation, nausea and vomiting.       Supra pubic  discomfort comes and goes.   Genitourinary:  Positive for dysuria and frequency. Negative for hematuria and urgency.       New urinary incontinent episode  Musculoskeletal:  Positive for arthralgias, back pain, gait problem and myalgias.       Chronic lower back pain. The R 3rd finger mid knuckle pain is chronic.  Skin:  Negative for color change.  Neurological:  Positive for tremors. Negative for speech difficulty, weakness and headaches.       Memory lapses. Minimal resting tremor in fingers, not disabling.   Psychiatric/Behavioral:  Positive for hallucinations. Negative for behavioral problems and sleep disturbance. The patient is nervous/anxious.        More visual hallucination, anxious.    Immunization History  Administered Date(s) Administered   Hepatitis A, Adult 03/01/1997, 09/20/1997   Influenza Split 09/09/2013, 09/27/2014   Influenza, High Dose Seasonal PF 08/01/2011, 08/27/2012, 10/14/2013, 09/08/2015, 09/10/2016, 09/12/2017, 09/12/2018, 09/15/2019   Influenza,inj,Quad PF,6+ Mos 09/11/2016   Influenza-Unspecified 09/21/2020   Moderna Sars-Covid-2 Vaccination 12/12/2019, 01/09/2020, 10/18/2020   Pneumococcal Conjugate-13 04/15/2014   Pneumococcal Polysaccharide-23 09/28/2005   Td 06/20/2016  Tdap 04/29/2006, 09/01/2015   Zoster Recombinat (Shingrix) 06/11/2018, 09/10/2018   Zoster, Live 04/29/2006   Pertinent  Health Maintenance Due  Topic Date Due   INFLUENZA VACCINE  07/10/2021   DEXA SCAN  Completed   PNA vac Low Risk Adult  Completed   Fall Risk  11/16/2019 04/23/2019 04/09/2019 10/29/2018 10/27/2018  Falls in the past year? 0 0 0 0 1  Comment - - - fell June 2019 -  Number falls in past yr: - 0 0 - 0  Injury with Fall? - 0 0 - 1  Comment - - - - -  Risk for fall due to : - - - - -  Risk for fall due to: Comment - - - - -   Functional Status Survey:    Vitals:   07/05/21 1411  BP: 105/64  Pulse: 91  Resp: 18  Temp: (!) 97.5 F (36.4 C)  SpO2: 97%   Weight: 132 lb 4.8 oz (60 kg)  Height: '4\' 10"'$  (1.473 m)   Body mass index is 27.65 kg/m. Physical Exam Vitals and nursing note reviewed.  Constitutional:      Appearance: Normal appearance.  HENT:     Head: Normocephalic and atraumatic.     Mouth/Throat:     Mouth: Mucous membranes are moist.  Eyes:     Extraocular Movements: Extraocular movements intact.     Conjunctiva/sclera: Conjunctivae normal.     Pupils: Pupils are equal, round, and reactive to light.  Cardiovascular:     Rate and Rhythm: Normal rate and regular rhythm.     Heart sounds: Murmur heard.  Pulmonary:     Breath sounds: No rhonchi or rales.  Abdominal:     General: Bowel sounds are normal.     Palpations: Abdomen is soft.     Tenderness: There is no abdominal tenderness. There is no right CVA tenderness, left CVA tenderness, guarding or rebound.  Musculoskeletal:        General: Tenderness present.     Cervical back: Normal range of motion and neck supple.     Right lower leg: Edema present.     Left lower leg: Edema present.     Comments: Trace edema BLE.  Arthritic changes in fingers, R>L. The R 3rd finger mid knuckle swelling, no heat or redness. Left ankle pain, in brace. Chronic lower back pain.  Skin:    General: Skin is warm and dry.     Comments: A marble sized hematoma occiput, abrasion mid thoracic spine.   Neurological:     General: No focal deficit present.     Mental Status: She is alert. Mental status is at baseline.     Gait: Gait abnormal.     Comments: Oriented to person, place. Ambulates with walker.   Psychiatric:        Mood and Affect: Mood normal.        Behavior: Behavior normal.     Comments: Visual hallucination vs delusion, anxiety    Labs reviewed: Recent Labs    10/05/20 0000 11/22/20 1109 04/12/21 0005  NA 142 141 140  K 4.2 4.2 4.2  CL 107 104 104  CO2 25* 22 25*  GLUCOSE  --  104*  --   BUN '17 17 18  '$ CREATININE 0.9 1.01* 0.9  CALCIUM 9.6 10.0 9.8   Recent  Labs    10/05/20 0000 04/12/21 0005  AST 19 19  ALT 15 8  ALKPHOS 62 78  ALBUMIN 3.5 4.1  Recent Labs    08/04/20 0000 10/05/20 0000 04/12/21 0005  WBC 6.2 4.8 6.7  NEUTROABS 3,677 2,328.00 4,147.00  HGB 8.9* 9.6* 11.0*  HCT 28* 30* 34*  PLT 354 288 282   Lab Results  Component Value Date   TSH 1.62 04/12/2021   No results found for: HGBA1C Lab Results  Component Value Date   CHOL 105 10/05/2020   HDL 46 10/05/2020   LDLCALC 38 10/05/2020   TRIG 119 10/05/2020   CHOLHDL 2.4 07/14/2019    Significant Diagnostic Results in last 30 days:  No results found.  Assessment/Plan Recurrent UTI Hx of rena lithiasis, recurrent UTI, urosepsis, off Keflex for suppression therapy, f/u Urology. On and off dysuria, UA C/S 05/23/21 P Aeruginosa >100,000c/ml susceptible to cephalosporins, ? Colonization, 05/26/21 placed on Cefdinir '300mg'$  bid x 7days. Urine culture 06/30/21 showed PS Aeruginosa >100,000c/ml, susceptible to Imipenem, Gentamicin, Tobromycin-pending choice of antibiotic, Dr. Lyndel Safe advised ID consultation.   GERD (gastroesophageal reflux disease) takes Pantoprazole '40mg'$  qd, Famotidine '20mg'$  qd. yearly FOBT per GI, Hgb 11.0 04/12/21  Parkinson's disease takes MiraPex '1mg'$  bid, Sinemet 25/100 II bid.  Depression, psychotic ([place]) Depression/hallucination, stabilizing, takes Sertraline '75mg'$  qd, Quetiapine was increased to '25mg'$  qhs, 12.'5mg'$  qam since 05/12/21  Chronic constipation takes Senokot S I qd, Amitiza 61mg bid, Colace qd  Hypothyroidism takes Levothyroxine 541m qd. TSH 1.62 04/11/21  Erosive (osteo)arthritis Erosive arthritis, takes Plaquenil '200mg'$  qd, Tylenol  f/u Rheumatology.   Chronic diastolic CHF (congestive heart failure) ([facility]) CHF/trace edema BLE, takes Furosemide '20mg'$  qd, cardiology, severe aortic valve stenosis. Bun/creat 18/0.89 04/11/21, Echocardiogram 11/15/20 EF 60-65%  Iron deficiency anemia  takes Fe, Hgb 11 04/11/21  Aortic stenosis  AS Cardiology  palliative approach   Family/ staff Communication: plan of care reviewed with the patient and charge nurse .  Labs/tests ordered:  none  Time spend 35 minutes.

## 2021-07-05 NOTE — Assessment & Plan Note (Signed)
takes Pantoprazole '40mg'$  qd, Famotidine '20mg'$  qd. yearly FOBT per GI, Hgb 11.0 04/12/21

## 2021-07-05 NOTE — Assessment & Plan Note (Signed)
takes Senokot S I qd, Amitiza 25mg bid, Colace qd

## 2021-07-05 NOTE — Assessment & Plan Note (Signed)
Erosive arthritis, takes Plaquenil '200mg'$  qd, Tylenol  f/u Rheumatology.

## 2021-07-05 NOTE — Assessment & Plan Note (Signed)
CHF/trace edema BLE, takes Furosemide '20mg'$  qd, cardiology, severe aortic valve stenosis. Bun/creat 18/0.89 04/11/21, Echocardiogram 11/15/20 EF 60-65%

## 2021-07-05 NOTE — Assessment & Plan Note (Signed)
AS Cardiology palliative approach

## 2021-07-05 NOTE — Assessment & Plan Note (Signed)
takes MiraPex '1mg'$  bid, Sinemet 25/100 II bid.

## 2021-07-05 NOTE — Assessment & Plan Note (Signed)
takes Levothyroxine 107mcg qd. TSH 1.62 04/11/21

## 2021-07-05 NOTE — Assessment & Plan Note (Signed)
takes Fe, Hgb 11 04/11/21

## 2021-07-05 NOTE — Assessment & Plan Note (Addendum)
Hx of rena lithiasis, recurrent UTI, urosepsis, off Keflex for suppression therapy, f/u Urology. On and off dysuria, UA C/S 05/23/21 P Aeruginosa >100,000c/ml susceptible to cephalosporins, ? Colonization, 05/26/21 placed on Cefdinir '300mg'$  bid x 7days. Urine culture 06/30/21 showed PS Aeruginosa >100,000c/ml, susceptible to Imipenem, Gentamicin, Tobromycin-pending choice of antibiotic, Dr. Lyndel Safe advised ID consultation.

## 2021-07-05 NOTE — Assessment & Plan Note (Signed)
Depression/hallucination, stabilizing, takes Sertraline '75mg'$  qd, Quetiapine was increased to '25mg'$  qhs, 12.'5mg'$  qam since 05/12/21

## 2021-07-06 ENCOUNTER — Encounter: Payer: Self-pay | Admitting: Neurology

## 2021-07-10 ENCOUNTER — Encounter: Payer: Self-pay | Admitting: Nurse Practitioner

## 2021-07-13 ENCOUNTER — Encounter: Payer: Self-pay | Admitting: Internal Medicine

## 2021-07-13 ENCOUNTER — Other Ambulatory Visit: Payer: Self-pay

## 2021-07-13 ENCOUNTER — Ambulatory Visit (INDEPENDENT_AMBULATORY_CARE_PROVIDER_SITE_OTHER): Payer: Medicare Other | Admitting: Internal Medicine

## 2021-07-13 ENCOUNTER — Telehealth: Payer: Self-pay

## 2021-07-13 DIAGNOSIS — R197 Diarrhea, unspecified: Secondary | ICD-10-CM | POA: Diagnosis not present

## 2021-07-13 NOTE — Progress Notes (Signed)
[facility]  Reason for Consult:Recurrent UTIs Referring Provider: Man [name], NP  Assessment: She has moderately severe Pseudomonas cystitis.  This will require IV meropenem.  I would recommend a 7-day course of therapy to see how she responds.  If this can be given through a peripheral IV at her skilled nursing facility that would be preferable.  If not she will need to have a midline or PICC placed.  She also needs to be screened for C. difficile given her recent diarrhea and antibiotic therapy.  If it is positive she will need to be treated with fidaxomicin.  Plan: Recommend meropenem 500 mg IV every 8 hours for 7 days Screen for C. difficile infection She will have a phone follow-up visit with me on 07/25/2021  Patient Active Problem List   Diagnosis Date Noted   Recurrent UTI 02/04/2018    Priority: High   Diarrhea 07/13/2021    Priority: Medium   Closed multiple fractures of left upper extremity with ribs with routine healing 05/12/2021   Orthostasis 05/05/2021   CKD (chronic kidney disease) stage 3, GFR 30-59 ml/min (HCC) 04/12/2021   Hematuria 05/18/2020   Obstruction of right ureteropelvic junction due to stone    Erosive (osteo)arthritis 03/09/2020   Right hand pain 02/01/2020   Dysuria 10/16/2019   Fall 09/21/2019   Depression, psychotic ([place]) 05/08/2019   Nose dryness 04/23/2019   Skin lesion 03/05/2019   DOE (dyspnea on exertion) 12/18/2018   Ingrowing nail, right great toe 12/18/2018   Iron deficiency anemia 10/09/2018   Renal lithiasis 09/19/2018   Urinary frequency 09/18/2018   Corns and callus 09/18/2018   Hallucinations, visual 07/21/2018   History of recent fall 07/21/2018   Unstable gait 04/21/2018   Memory loss 04/21/2018   Abnormal nuclear stress test    Hypothyroidism 02/04/2018   Chronic back pain 02/04/2018   Osteoporosis without current pathological fracture 02/04/2018   Sjogren's syndrome ([place]) 02/04/2018    Chronic constipation 02/04/2018   RBBB    Aortic stenosis    Excessive cerumen in both ear canals 09/24/2016   Sensorineural hearing loss (SNHL), bilateral 09/24/2016   Temporomandibular jaw dysfunction 09/24/2016   Microhematuria 03/24/2015   Follow up 02/15/2015   Other dysphagia 09/03/2014   Unspecified hereditary and idiopathic peripheral neuropathy 09/03/2014   Heart murmur 04/19/2014   GERD (gastroesophageal reflux disease)    Hypercholesterolemia    PVD (peripheral vascular disease) (HCC)    Barrett esophagus    Chronic diastolic CHF (congestive heart failure) ([place])    Parkinson's disease ([place]) 08/26/2013   Swelling of both ankles 11/20/2012   Fracture of multiple pubic rami ([place]) 07/17/2012   Hypertension    Atherosclerosis of native arteries of the extremities with intermittent claudication 05/22/2012    Patient's Medications  New Prescriptions   No medications on file  Previous Medications   ACETAMINOPHEN (TYLENOL) 500 MG TABLET    Take 500 mg by mouth 3 (three) times daily as needed for mild pain. not to exceed 3 gm in 24 hours. As needed for arthritis pain   ASPIRIN 81 MG EC TABLET    Take 81 mg by mouth daily. Swallow whole.   CALCIUM CARB-CHOLECALCIFEROL (CALCIUM 600/VITAMIN D3) 600-800 MG-UNIT TABS    Take 1 tablet by mouth daily.   CARBIDOPA-LEVODOPA (SINEMET IR) 25-100 MG TABLET    Take 1 tablet by mouth 3 (three) times daily.   CHOLECALCIFEROL (VITAMIN D) 50 MCG (2000 UT)  TABLET    Take 2,000 Units by mouth daily.   DOCUSATE SODIUM (COLACE) 100 MG CAPSULE    Take 100 mg by mouth at bedtime.   FAMOTIDINE (PEPCID) 20 MG TABLET    Take 20 mg by mouth daily.   FERROUS SULFATE (SLOW FE) 142 (45 FE) MG TBCR    Take 1 tablet by mouth. Daily   FUROSEMIDE (LASIX) 20 MG TABLET    Take 20 mg by mouth daily as needed.   FUROSEMIDE (LASIX) 20 MG TABLET    Take 1 tablet (20 mg total) by mouth daily.   HYDROXYCHLOROQUINE (PLAQUENIL) 200 MG TABLET    Take 200 mg by mouth  daily.   LEVOTHYROXINE (SYNTHROID, LEVOTHROID) 50 MCG TABLET    Take 50 mcg by mouth daily before breakfast.    LUBIPROSTONE (AMITIZA) 24 MCG CAPSULE    Take 24 mcg by mouth 2 (two) times daily with a meal.    MULTIPLE MINERALS-VITAMINS PO    Take 1 tablet by mouth daily.   MULTIPLE VITAMINS-MINERALS (PRESERVISION AREDS 2 PO)    Take 1 tablet by mouth daily.   NYSTATIN (MYCOSTATIN/NYSTOP) POWDER    Apply topically 2 (two) times daily as needed.   PANTOPRAZOLE (PROTONIX) 40 MG TABLET    Take 40 mg by mouth daily.   POLYETHYL GLYCOL-PROPYL GLYCOL (SYSTANE) 0.4-0.3 % SOLN    Apply 1 drop to eye 2 (two) times daily as needed (dry eyes).    POTASSIUM CITRATE (UROCIT-K) 10 MEQ (1080 MG) SR TABLET    Take 10 mEq by mouth 2 (two) times daily.   PRAMIPEXOLE (MIRAPEX) 1 MG TABLET    Take 1 mg by mouth 2 (two) times daily.   QUETIAPINE (SEROQUEL) 25 MG TABLET    Take 25 mg by mouth daily.   ROSUVASTATIN (CRESTOR) 20 MG TABLET    Take 20 mg by mouth daily.   SACCHAROMYCES BOULARDII (FLORASTOR) 250 MG CAPSULE    Take 250 mg by mouth daily.    SENNA-DOCUSATE (SENOKOT-S) 8.6-50 MG TABLET    Take 1 tablet by mouth at bedtime.    SERTRALINE (ZOLOFT) 50 MG TABLET    Take 75 mg by mouth at bedtime. 1-1/2 tablets to = 75 mg   SKIN PROTECTANTS, MISC. (EUCERIN) CREAM    Apply 1 application topically daily.   SPACER/AERO-HOLDING CHAMBERS (AEROCHAMBER MV) INHALER    by Other route. Use as instructed as needed   TRAMADOL (ULTRAM) 50 MG TABLET    Take 1 tablet (50 mg total) by mouth 3 (three) times daily.  Modified Medications   No medications on file  Discontinued Medications   No medications on file    HPI: CHARLETHA RENFER is a 87 y.o. female with a history of recurrent UTIs.  She is to take daily cephalexin as prophylaxis but that appears to have been stopped several months ago.  Over the past 6 weeks she has been having increasing difficulty with severe dysuria and incontinence.  She has had 3 urine cultures  done over the past 6 weeks.  All have grown Pseudomonas.  She was treated with cefdinir from 05/26/2021 to 05/31/2021.  She does not recall this and does not think that anything has helped improve her symptoms recently.  Her initial Pseudomonas isolate was resistant to quinolone antibiotics.  The second isolate was sensitive but the third isolate was reported as intermediately resistant.  She was treated with ciprofloxacin from 06/09/2021 until 06/16/2021.  Again she did not note any improvement  in her symptoms.  She has not had any fever.  There has been no change in her chronic back pain.  She also reports having had some intermittent diarrhea over the last month.  She says that normally she is constipated.  Review of Systems: Review of Systems  Constitutional:  Negative for chills, diaphoresis and fever.  Gastrointestinal:  Positive for abdominal pain. Negative for diarrhea, nausea and vomiting.  Genitourinary:  Positive for dysuria, frequency and urgency. Negative for flank pain.     Past Medical History:  Diagnosis Date   Abnormal nuclear stress test    Actinic cheilitis 04/23/2019   Anemia    iron deficiency    Anxiety    Aortic stenosis    severe by echo 2021    Arthritis    Broken arm    right    Chronic diastolic CHF (congestive heart failure) ([place])    Chronic pain    Cognitive communication deficit    Constipation    Depression    Erosive (osteo)arthritis 03/09/2020   Family history of adverse reaction to anesthesia    pt. states sister vomits   Fibromyalgia    GERD (gastroesophageal reflux disease)    H/O hiatal hernia    Hallucinations, visual 07/21/2018   03/10/20 psych consult.    Hematuria 05/18/2020   History of bronchitis    History of kidney stones    Hypercholesterolemia    Hypertension    dr t turner   Hypothyroidism    Memory loss 04/21/2018   08/08/18 CT head no traumatic findings, atrophy.  09/27/19 MMSE 25/30, failed the clock drawing   Microhematuria 03/24/2015    Neuropathy    Osteoporosis    Parkinson disease ([place])    PVD (peripheral vascular disease) (HCC)    99% stenosis of left anteiror tibial artery, mod stenosis of left distal SFA and popliteal artery followed by [name]   RBBB    noted on EKG 2018   Restless leg syndrome    Sepsis ([place])    hx of due to Ecoli   Septic shock ([place]) 04/19/2020   Shortness of breath    with exertion - chronic    Sjogren's disease ([place])    Sjogren's syndrome ([place]) 02/04/2018   04/16/19 rheumatology: Erosive OA of hands, Sjogrens syndrome, low back pain at multiple sites, recurrent kidney stones, age related osteoporosis w/o current pathological fracture, f/u 6 months.    SOB (shortness of breath)    chronic due to diastolic dysfunction, deconditioning, obesity   Spinal stenosis    lumbar region    Tremor    Unstable gait 04/21/2018   Unsteadiness on feet    Urinary frequency 09/18/2018   Urinary tract infection    Visual hallucinations     Social History   Tobacco Use   Smoking status: Never   Smokeless tobacco: Never  Vaping Use   Vaping Use: Never used  Substance Use Topics   Alcohol use: No    Alcohol/week: 0.0 standard drinks   Drug use: No    Family History  Problem Relation Age of Onset   Heart attack Mother    Restless legs syndrome Mother    Heart failure Mother    Heart disease Mother    Hypertension Mother    COPD Father    Allergies  Allergen Reactions   Adrenalone    Lactose Other (See Comments)    abd pain, lactose intolerant   Morphine Other (See Comments)   Sulfa  Antibiotics Other (See Comments)    Headache, very sick   Codeine Other (See Comments)    Reaction:  Headaches and nightmares    Lactose Intolerance (Gi) Nausea And Vomiting   Latex Rash   Lyrica [Pregabalin] Swelling and Other (See Comments)    Reaction:  Leg swelling   Other Other (See Comments)    Pt states that pain medications give her nightmares.     Plaquenil [Hydroxychloroquine Sulfate] Other  (See Comments)    Reaction:  GI upset    Reglan [Metoclopramide] Other (See Comments)    Reaction:  GI upset    Requip [Ropinirole Hcl] Other (See Comments)    Reaction:  GI upset    Septra [Sulfamethoxazole-Trimethoprim] Nausea And Vomiting   Shellfish Allergy Nausea And Vomiting    OBJECTIVE: Vitals:   07/13/21 0858  BP: (!) 87/53  Pulse: 94  Temp: (!) 97.4 F (36.3 C)  TempSrc: Oral  SpO2: 95%   There is no height or weight on file to calculate BMI.   Physical Exam Constitutional:      Comments: She is very pleasant and in no obvious distress.  She is seated in a wheelchair.  She is accompanied by her husband, Saralyn Pilar.  Cardiovascular:     Rate and Rhythm: Normal rate and regular rhythm.     Heart sounds: Murmur heard.     Comments: She has a harsh 3/6 systolic murmur from her aortic stenosis. Pulmonary:     Effort: Pulmonary effort is normal.     Breath sounds: Normal breath sounds.  Abdominal:     Palpations: Abdomen is soft.     Tenderness: There is abdominal tenderness.     Comments: She has mild suprapubic tenderness.  Psychiatric:        Mood and Affect: Mood normal.    Microbiology: Urine culture 06/30/2021 grew Pseudomonas aeruginosa susceptible only to imipenem and aminoglycosides  Michel Bickers, MD [facility] for Infectious [place] [phone] pager   [phone] cell 07/13/2021, 9:59 AM

## 2021-07-13 NOTE — Telephone Encounter (Signed)
Spoke to Earley Favor, RN at [facility] to verify if patient is able to get set up for peripheral IV Meropenem q8h x 7 days per Dr.Campbell. [facility] stated that they have never given IV medication to patient and would have to see if her veins could tolerate and if not patient would need a PICC line or Midline IV. [facility] will contact our office if patient unable to receive peripheral IV.    [place], CMA

## 2021-07-21 DIAGNOSIS — R441 Visual hallucinations: Secondary | ICD-10-CM | POA: Diagnosis not present

## 2021-07-21 DIAGNOSIS — F419 Anxiety disorder, unspecified: Secondary | ICD-10-CM | POA: Diagnosis not present

## 2021-07-21 DIAGNOSIS — F323 Major depressive disorder, single episode, severe with psychotic features: Secondary | ICD-10-CM | POA: Diagnosis not present

## 2021-07-25 ENCOUNTER — Telehealth: Payer: Medicare Other | Admitting: Internal Medicine

## 2021-07-27 ENCOUNTER — Telehealth (INDEPENDENT_AMBULATORY_CARE_PROVIDER_SITE_OTHER): Payer: Medicare Other | Admitting: Internal Medicine

## 2021-07-27 ENCOUNTER — Other Ambulatory Visit: Payer: Self-pay

## 2021-07-27 DIAGNOSIS — N390 Urinary tract infection, site not specified: Secondary | ICD-10-CM

## 2021-07-27 NOTE — Progress Notes (Signed)
Virtual Visit via Telephone Note  I connected with [name] on 07/27/21 at  9:00 AM EDT by telephone and verified that I am speaking with the correct person using two identifiers.  Location: Patient: Skilled nursing facility Provider: [facility]   I discussed the limitations, risks, security and privacy concerns of performing an evaluation and management service by telephone and the availability of in person appointments. I also discussed with the patient that there may be a patient responsible charge related to this service. The patient expressed understanding and agreed to proceed.   History of Present Illness: I called and spoke with [name] and her husband today.  She completed a 7-day course of IV meropenem about 3 days ago.  A peripheral IV was used.  Unfortunately she did not note any significant improvement in her severe dysuria or urinary incontinence.  She continues to have intermittent watery diarrhea.  She does not believe a stool specimen was ever collected for C. difficile testing.  She has not had any fever, nausea or vomiting.   Observations/Objective:   Assessment and Plan: I recommend that she have repeat UA and urine culture done to see if she is involved with a more resistant urinary tract infection and might need treatment with an alternative antibiotic.  I also suggest that, if she continues to have watery diarrhea, she be screened for C. difficile infection.  Follow Up Instructions: Recommend repeat UA and urine culture Recommend C. difficile testing if she continues to have watery diarrhea Please call me [phone]) and fax [phone]) results of these test once they are available   I discussed the assessment and treatment plan with the patient. The patient was provided an opportunity to ask questions and all were answered. The patient agreed with the plan and demonstrated an understanding of the instructions.   The patient was advised to call back or  seek an in-person evaluation if the symptoms worsen or if the condition fails to improve as anticipated.  I provided 16 minutes of non-face-to-face time during this encounter.   Michel Bickers, MD

## 2021-07-28 ENCOUNTER — Encounter: Payer: Self-pay | Admitting: Orthopedic Surgery

## 2021-07-28 ENCOUNTER — Non-Acute Institutional Stay (SKILLED_NURSING_FACILITY): Payer: Medicare Other | Admitting: Orthopedic Surgery

## 2021-07-28 DIAGNOSIS — N390 Urinary tract infection, site not specified: Secondary | ICD-10-CM

## 2021-07-28 DIAGNOSIS — R197 Diarrhea, unspecified: Secondary | ICD-10-CM

## 2021-07-28 NOTE — Progress Notes (Signed)
Location:   [place] Room Number: [place] of Service:  SNF [id number]) Provider:  Windell Moulding, NP  [name], MD  Patient Care Team: [name], MD as PCP - General (Internal Medicine) [name], MD as PCP - Cardiology (Cardiology) [name], MD (Neurosurgery) [name], MD (Neurology) [name], MD (Inactive) as Consulting Physician (Gastroenterology) [name], MD as Consulting Physician (Urology) [name], Man X, NP as Nurse Practitioner (Internal Medicine)  Extended Emergency Contact Information Primary Emergency Contact: [facility] Address: [address]          [place], [place] [address] [name] of [place] Phone: [phone] Mobile Phone: [phone] Relation: Spouse Secondary Emergency Contact: [name] Mobile Phone: [phone] Relation: Daughter Preferred language: [name] Interpreter needed? No  Code Status:  DNR Goals of care: Advanced Directive information Advanced Directives 07/28/2021  Does Patient Have a Medical Advance Directive? Yes  Type of Advance Directive Living will;Out of facility DNR (pink MOST or yellow form)  Does patient want to make changes to medical advance directive? No - Patient declined  Copy of [place] in Chart? -  Would patient like information on creating a medical advance directive? -  Pre-existing out of facility DNR order (yellow form or pink MOST form) Yellow form placed in chart (order not valid for inpatient use)     Chief Complaint  Patient presents with   Acute Visit    Patient presents for diarrhea     HPI:  Pt is a 87 y.o. female seen today for an acute visit for diarrhea.   Husband present during encounter.   She currently resides on the skilled nursing unit at [facility]. Past medical history includes: aortic stenosis, CHF, HTN, PVD, RBBB, barrett esophagus, constipation, GERD, hypothyroidism,  parkinson's, OA, osteoporosis, CKD stage III, chronic back pain, unstable gait, iron deficiency anemia, and memory loss.    In the past month she has been treated for recurrent UTI. Urine culture 05/23/21 positive for P. Aeruginosa > 100,000c/ml, given cefdinir 300 mg po bid x 7 days. Urine culture 06/30/2021 showed PS Aeruginosa > 100,000 c/ml, susceptible to imipenem, gentamycin, and tobramycin. ID consulted. She was started on IV meropenem x 7 days, completed 3 days ago. In the past week she has complained of watery diarrhea. Today, she denies watery diarrhea for the past 2 days. She denies abdominal pain and blood in stool. She has also been given Florastor while on meropenem. She also continues to complain of dysuria. UA and urine culture recollected today. She is requesting a urology consult. Appears she has a Cysto with stent placement due to right urethral stone by Dr. [name] 05/24/2020.      Past Medical History:  Diagnosis Date   Abnormal nuclear stress test    Actinic cheilitis 04/23/2019   Anemia    iron deficiency    Anxiety    Aortic stenosis    severe by echo 2021    Arthritis    Broken arm    right    Chronic diastolic CHF (congestive heart failure) (HCC)    Chronic pain    Cognitive communication deficit    Constipation    Depression    Erosive (osteo)arthritis 03/09/2020   Family history of adverse reaction to anesthesia    pt. states sister vomits   Fibromyalgia    GERD (gastroesophageal reflux disease)    H/O hiatal hernia    Hallucinations, visual  07/21/2018   03/10/20 psych consult.    Hematuria 05/18/2020   History of bronchitis    History of kidney stones    Hypercholesterolemia    Hypertension    dr t turner   Hypothyroidism    Memory loss 04/21/2018   08/08/18 CT head no traumatic findings, atrophy.  09/27/19 MMSE 25/30, failed the clock drawing   Microhematuria 03/24/2015   Neuropathy    Osteoporosis    Parkinson disease ([place])    PVD (peripheral vascular  disease) (HCC)    99% stenosis of left anteiror tibial artery, mod stenosis of left distal SFA and popliteal artery followed by [name]   RBBB    noted on EKG 2018   Restless leg syndrome    Sepsis ([place])    hx of due to Ecoli   Septic shock ([place]) 04/19/2020   Shortness of breath    with exertion - chronic    Sjogren's disease ([place])    Sjogren's syndrome ([place]) 02/04/2018   04/16/19 rheumatology: Erosive OA of hands, Sjogrens syndrome, low back pain at multiple sites, recurrent kidney stones, age related osteoporosis w/o current pathological fracture, f/u 6 months.    SOB (shortness of breath)    chronic due to diastolic dysfunction, deconditioning, obesity   Spinal stenosis    lumbar region    Tremor    Unstable gait 04/21/2018   Unsteadiness on feet    Urinary frequency 09/18/2018   Urinary tract infection    Visual hallucinations    Past Surgical History:  Procedure Laterality Date   ABDOMINAL HYSTERECTOMY     BACK SURGERY     4 back surgeries,   lumbar fusion   CARDIAC CATHETERIZATION  2006   normal   CYSTOSCOPY/URETEROSCOPY/HOLMIUM LASER/STENT PLACEMENT Left 01/06/2019   Procedure: CYSTOSCOPY/RETROGRADE/URETEROSCOPY/HOLMIUM LASER/BASKET RETRIEVAL/STENT PLACEMENT;  Surgeon: Ceasar Mons, MD;  Location: [facility];  Service: Urology;  Laterality: Left;   CYSTOSCOPY/URETEROSCOPY/HOLMIUM LASER/STENT PLACEMENT Bilateral 05/24/2020   Procedure: CYSTOSCOPY/RETROGRADE/URETEROSCOPY/HOLMIUM LASER/STENT PLACEMENT;  Surgeon: Ceasar Mons, MD;  Location: [facility];  Service: Urology;  Laterality: Bilateral;   EYE SURGERY Bilateral    cateracts   falls     variious fall, broken wrist,and toes   FRACTURE SURGERY Right April 2016   Wrist, Pt. fell   IR NEPHROSTOMY PLACEMENT RIGHT  04/19/2020   kidney stone removal Left 01/20/2019   RIGHT/LEFT HEART CATH AND CORONARY ANGIOGRAPHY N/A 02/05/2018   Procedure: RIGHT/LEFT HEART CATH AND CORONARY ANGIOGRAPHY;  Surgeon: Troy Sine, MD;  Location: [place];  Service: Cardiovascular;  Laterality: N/A;   SPINAL CORD STIMULATOR INSERTION N/A 09/09/2015   Procedure: LUMBAR SPINAL CORD STIMULATOR INSERTION;  Surgeon: Clydell Hakim, MD;  Location: [facility];  Service: Neurosurgery;  Laterality: N/A;  LUMBAR SPINAL CORD STIMULATOR INSERTION   SPINE SURGERY  April 2013   Back X's 4   TOTAL HIP ARTHROPLASTY     right   WRIST FRACTURE SURGERY Bilateral     Allergies  Allergen Reactions   Adrenalone    Lactose Other (See Comments)    abd pain, lactose intolerant   Morphine Other (See Comments)   Sulfa Antibiotics Other (See Comments)    Headache, very sick   Codeine Other (See Comments)    Reaction:  Headaches and nightmares    Lactose Intolerance (Gi) Nausea And Vomiting   Latex Rash   Lyrica [Pregabalin] Swelling and Other (See Comments)    Reaction:  Leg swelling   Other Other (See  Comments)    Pt states that pain medications give her nightmares.     Plaquenil [Hydroxychloroquine Sulfate] Other (See Comments)    Reaction:  GI upset    Reglan [Metoclopramide] Other (See Comments)    Reaction:  GI upset    Requip [Ropinirole Hcl] Other (See Comments)    Reaction:  GI upset    Septra [Sulfamethoxazole-Trimethoprim] Nausea And Vomiting   Shellfish Allergy Nausea And Vomiting    Allergies as of 07/28/2021       Reactions   Adrenalone    Lactose Other (See Comments)   abd pain, lactose intolerant   Morphine Other (See Comments)   Sulfa Antibiotics Other (See Comments)   Headache, very sick   Codeine Other (See Comments)   Reaction:  Headaches and nightmares    Lactose Intolerance (gi) Nausea And Vomiting   Latex Rash   Lyrica [pregabalin] Swelling, Other (See Comments)   Reaction:  Leg swelling   Other Other (See Comments)   Pt states that pain medications give her nightmares.     Plaquenil [hydroxychloroquine Sulfate] Other (See Comments)   Reaction:  GI upset    Reglan  [metoclopramide] Other (See Comments)   Reaction:  GI upset    Requip [ropinirole Hcl] Other (See Comments)   Reaction:  GI upset    Septra [sulfamethoxazole-trimethoprim] Nausea And Vomiting   Shellfish Allergy Nausea And Vomiting        Medication List        Accurate as of July 28, 2021  2:44 PM. If you have any questions, ask your nurse or doctor.          acetaminophen 500 MG tablet Commonly known as: TYLENOL Take 500 mg by mouth 3 (three) times daily as needed for mild pain. not to exceed 3 gm in 24 hours. As needed for arthritis pain   AeroChamber MV inhaler by Other route. Use as instructed as needed   aspirin 81 MG EC tablet Take 81 mg by mouth daily. Swallow whole.   Calcium 600/Vitamin D3 600-800 MG-UNIT Tabs Generic drug: Calcium Carb-Cholecalciferol Take 1 tablet by mouth daily.   carbidopa-levodopa 25-100 MG tablet Commonly known as: SINEMET IR Take 1 tablet by mouth 3 (three) times daily.   docusate sodium 100 MG capsule Commonly known as: COLACE Take 100 mg by mouth at bedtime.   eucerin cream Apply 1 application topically daily.   famotidine 20 MG tablet Commonly known as: PEPCID Take 20 mg by mouth daily.   furosemide 20 MG tablet Commonly known as: LASIX Take 20 mg by mouth daily as needed.   furosemide 20 MG tablet Commonly known as: LASIX Take 1 tablet (20 mg total) by mouth daily.   hydroxychloroquine 200 MG tablet Commonly known as: PLAQUENIL Take 200 mg by mouth daily.   levothyroxine 50 MCG tablet Commonly known as: SYNTHROID Take 50 mcg by mouth daily before breakfast.   lubiprostone 24 MCG capsule Commonly known as: AMITIZA Take 24 mcg by mouth 2 (two) times daily with a meal.   MULTIPLE MINERALS-VITAMINS PO Take 1 tablet by mouth daily.   nystatin powder Commonly known as: MYCOSTATIN/NYSTOP Apply topically 2 (two) times daily as needed.   pantoprazole 40 MG tablet Commonly known as: PROTONIX Take 40 mg by  mouth daily.   potassium citrate 10 MEQ (1080 MG) SR tablet Commonly known as: UROCIT-K Take 10 mEq by mouth 2 (two) times daily.   pramipexole 1 MG tablet Commonly known as: MIRAPEX Take 1  mg by mouth 2 (two) times daily.   PRESERVISION AREDS 2 PO Take 1 tablet by mouth daily.   QUEtiapine 25 MG tablet Commonly known as: SEROQUEL Take 25 mg by mouth daily.   QUEtiapine 25 MG tablet Commonly known as: SEROQUEL Take 12.5 mg by mouth daily.   rosuvastatin 20 MG tablet Commonly known as: CRESTOR Take 20 mg by mouth daily.   saccharomyces boulardii 250 MG capsule Commonly known as: FLORASTOR Take 250 mg by mouth daily.   senna-docusate 8.6-50 MG tablet Commonly known as: Senokot-S Take 1 tablet by mouth at bedtime.   sertraline 50 MG tablet Commonly known as: ZOLOFT Take 75 mg by mouth at bedtime. 1-1/2 tablets to = 75 mg   Slow Fe 142 (45 Fe) MG Tbcr Generic drug: Ferrous Sulfate Take 1 tablet by mouth. Daily   Systane 0.4-0.3 % Soln Generic drug: Polyethyl Glycol-Propyl Glycol Apply 1 drop to eye 2 (two) times daily as needed (dry eyes).   traMADol 50 MG tablet Commonly known as: ULTRAM Take 1 tablet (50 mg total) by mouth 3 (three) times daily.   Vitamin D 50 MCG (2000 UT) tablet Take 2,000 Units by mouth daily.        Review of Systems  Constitutional:  Negative for activity change, appetite change, fatigue and fever.  Respiratory:  Negative for cough, shortness of breath and wheezing.   Cardiovascular:  Negative for chest pain and leg swelling.  Gastrointestinal:  Positive for diarrhea. Negative for abdominal distention, abdominal pain, constipation and nausea.  Genitourinary:  Positive for dysuria and frequency. Negative for hematuria.  Psychiatric/Behavioral:  Positive for confusion. Negative for dysphoric mood. The patient is not nervous/anxious.    Immunization History  Administered Date(s) Administered   Hepatitis A, Adult 03/01/1997, 09/20/1997    Influenza Split 09/09/2013, 09/27/2014   Influenza, High Dose Seasonal PF 08/01/2011, 08/27/2012, 10/14/2013, 09/08/2015, 09/10/2016, 09/12/2017, 09/12/2018, 09/15/2019   Influenza,inj,Quad PF,6+ Mos 09/11/2016   Influenza-Unspecified 09/21/2020   Moderna Sars-Covid-2 Vaccination 12/12/2019, 01/09/2020, 10/18/2020   Pneumococcal Conjugate-13 04/15/2014   Pneumococcal Polysaccharide-23 09/28/2005   Td 06/20/2016   Tdap 04/29/2006, 09/01/2015   Zoster Recombinat (Shingrix) 06/11/2018, 09/10/2018   Zoster, Live 04/29/2006   Pertinent  Health Maintenance Due  Topic Date Due   INFLUENZA VACCINE  07/10/2021   DEXA SCAN  Completed   PNA vac Low Risk Adult  Completed   Fall Risk  07/13/2021 11/16/2019 04/23/2019 04/09/2019 10/29/2018  Falls in the past year? 0 0 0 0 0  Comment - - - - fell June 2019  Number falls in past yr: 0 - 0 0 -  Injury with Fall? 0 - 0 0 -  Comment - - - - -  Risk for fall due to : - - - - -  Risk for fall due to: Comment - - - - -   Functional Status Survey:    Vitals:   07/28/21 1432  BP: (!) 90/52  Pulse: 72  Resp: 16  Temp: (!) 97.5 F (36.4 C)  SpO2: 98%  Weight: 128 lb 9.6 oz (58.3 kg)  Height: '4\' 10"'$  (1.473 m)   Body mass index is 26.88 kg/m. Physical Exam Vitals reviewed.  Constitutional:      General: She is not in acute distress. HENT:     Head: Normocephalic.  Cardiovascular:     Rate and Rhythm: Normal rate and regular rhythm.     Pulses: Normal pulses.     Heart sounds: Normal heart sounds. No  murmur heard. Pulmonary:     Effort: Pulmonary effort is normal. No respiratory distress.     Breath sounds: Normal breath sounds. No wheezing.  Abdominal:     General: Bowel sounds are normal. There is no distension.     Palpations: Abdomen is soft.     Tenderness: There is no abdominal tenderness.  Musculoskeletal:     Right lower leg: No edema.     Left lower leg: No edema.  Skin:    General: Skin is warm and dry.     Capillary  Refill: Capillary refill takes less than 2 seconds.  Neurological:     General: No focal deficit present.     Mental Status: She is alert. Mental status is at baseline.     Motor: Weakness present.     Gait: Gait abnormal.     Comments: walker  Psychiatric:        Mood and Affect: Mood normal.        Behavior: Behavior normal.        Cognition and Memory: Memory is impaired.    Labs reviewed: Recent Labs    10/05/20 0000 11/22/20 1109 04/12/21 0005  NA 142 141 140  K 4.2 4.2 4.2  CL 107 104 104  CO2 25* 22 25*  GLUCOSE  --  104*  --   BUN '17 17 18  '$ CREATININE 0.9 1.01* 0.9  CALCIUM 9.6 10.0 9.8   Recent Labs    10/05/20 0000 04/12/21 0005  AST 19 19  ALT 15 8  ALKPHOS 62 78  ALBUMIN 3.5 4.1   Recent Labs    08/04/20 0000 10/05/20 0000 04/12/21 0005  WBC 6.2 4.8 6.7  NEUTROABS 3,677 2,328.00 4,147.00  HGB 8.9* 9.6* 11.0*  HCT 28* 30* 34*  PLT 354 288 282   Lab Results  Component Value Date   TSH 1.62 04/12/2021   No results found for: HGBA1C Lab Results  Component Value Date   CHOL 105 10/05/2020   HDL 46 10/05/2020   LDLCALC 38 10/05/2020   TRIG 119 10/05/2020   CHOLHDL 2.4 07/14/2019    Significant Diagnostic Results in last 30 days:  No results found.  Assessment/Plan 1. Recurrent UTI - urine culture 06/30/2021 showed PS Aeruginosa > 100,000 c/ml, susceptible to imipenem, gentamycin, and tobramycin - followed by ID - completed IV meropenem x 7 days 3 days ago - UA and urine culture collected today - consult urology- Dr. [name]  2. Diarrhea, unspecified type - suspect due to recent antibiotic - denies watery diarrhea for last 2 days or abdominal pain - check stool for cdiff if diarrhea returns - cont Development worker, international aid Communication: plan discussed with patient, husband and nurse  Labs/tests ordered: check stool for c.diff if diarrhea returns

## 2021-08-02 ENCOUNTER — Encounter: Payer: Self-pay | Admitting: Nurse Practitioner

## 2021-08-02 ENCOUNTER — Non-Acute Institutional Stay (SKILLED_NURSING_FACILITY): Payer: Medicare Other | Admitting: Nurse Practitioner

## 2021-08-02 ENCOUNTER — Telehealth: Payer: Self-pay

## 2021-08-02 DIAGNOSIS — I350 Nonrheumatic aortic (valve) stenosis: Secondary | ICD-10-CM | POA: Diagnosis not present

## 2021-08-02 DIAGNOSIS — M154 Erosive (osteo)arthritis: Secondary | ICD-10-CM

## 2021-08-02 DIAGNOSIS — E039 Hypothyroidism, unspecified: Secondary | ICD-10-CM | POA: Diagnosis not present

## 2021-08-02 DIAGNOSIS — S2242XA Multiple fractures of ribs, left side, initial encounter for closed fracture: Secondary | ICD-10-CM | POA: Diagnosis not present

## 2021-08-02 DIAGNOSIS — M549 Dorsalgia, unspecified: Secondary | ICD-10-CM | POA: Diagnosis not present

## 2021-08-02 DIAGNOSIS — D508 Other iron deficiency anemias: Secondary | ICD-10-CM

## 2021-08-02 DIAGNOSIS — G20 Parkinson's disease: Secondary | ICD-10-CM | POA: Diagnosis not present

## 2021-08-02 DIAGNOSIS — I5032 Chronic diastolic (congestive) heart failure: Secondary | ICD-10-CM

## 2021-08-02 DIAGNOSIS — K5909 Other constipation: Secondary | ICD-10-CM

## 2021-08-02 DIAGNOSIS — N390 Urinary tract infection, site not specified: Secondary | ICD-10-CM | POA: Diagnosis not present

## 2021-08-02 DIAGNOSIS — K219 Gastro-esophageal reflux disease without esophagitis: Secondary | ICD-10-CM

## 2021-08-02 DIAGNOSIS — I951 Orthostatic hypotension: Secondary | ICD-10-CM

## 2021-08-02 DIAGNOSIS — F323 Major depressive disorder, single episode, severe with psychotic features: Secondary | ICD-10-CM | POA: Diagnosis not present

## 2021-08-02 NOTE — Assessment & Plan Note (Signed)
AS Cardiology palliative approach

## 2021-08-02 NOTE — Progress Notes (Signed)
Assessment/Plan:   Parkinsonism I am not convinced that the patient has idiopathic Parkinson's disease.  If, in fact, the patient had this since 2011, I would expect that there would be no question of whether or not she had the disease.  I think it would be unusual that the medication would be covering up the symptoms since she is only taking it twice per day and the bedtime dose would be unhelpful and the morning dose would have worn off by now.  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  We discussed the nature and pathophysiology of that today. Regardless of diagnosis, the pramipexole would not be appropriate for her given delusions and hallucinations.  I wonder if the pramipexole was not coming in fact, generating these as she was pretty appropriate in the office today with fairly accurate medical history.  I am going to go ahead and wean off of the pramipexole.  She is currently on 1 mg in the morning and bedtime.  She will take 0.5 mg twice per day for 1 month and then 0.5 mg at bedtime only for 1 month and then we will discontinue the pramipexole. I will have her hold her levodopa 24 hours prior to next visit.  Subjective:   [name] was seen today in the movement disorders clinic for neurologic consultation at the request of Amanda Dad, MD.  Pt is accompanied by her husband who supplements the history.   Patient previously under the care of [facility] neurology.  Patient has been under their care for many years (dating back earlier than 2012, and we only have records 2014 forward).  She last saw [facility] neurology in 2020.  Patient's first symptom was left hand rest tremor in approximately 2011.  Notes indicate that she was already on pramipexole at that time for many year history of restless leg.  It is unclear when levodopa was added, but it was prior to 2014.  Freezing dates back at least to 2016 per records.  Issues with memory concerns at least back to 2017/2018.   She quit driving in approximately 2019 (states that she failed her driving test).  Noting some hallucinations in 2020 and pramipexole decreased to twice per day dosing.  Patient last seen in December, 2020 by [facility] neurology.  Noted that patient was having continued hallucinations and delusions.  Medications were not changed.  Noting that she was on quetiapine for agitation and confusion.  Current movement disorder medications: Pramipexole 1 mg twice per day Carbidopa/levodopa 25/100, 2 tablets twice per day (AM and bed)   Specific Symptoms:  Tremor: Yes.  , hands and legs per pt, L more than R per pt Family hx of similar:  No. Voice: weaker per pt Sleep: sleeps well  Vivid Dreams:  No.  Acting out dreams:  No. Wet Pillows: Yes.   Postural symptoms:  Yes.    Falls?  Yes.  And multiple over the years have led to fractures.  Dating back all the way to 2016, the patient had a fall with a distal radius fracture on the right..  never had fx with walker.   Bradykinesia symptoms: difficulty with initiating movement, slow movements, and difficulty getting out of a chair Loss of smell:  No. Loss of taste:  No. Urinary Incontinence:  Yes.   Difficulty Swallowing:  Yes.  , with pills Trouble with ADL's:  Yes.  , some help with socks but otherwise does ok  Trouble buttoning clothing: Yes.  Depression:  No. But describes some "sadness" Memory changes:  both state that it is generally okay - long term is good but short term is pretty good as well Hallucinations:  Yes.  , husband states that It happens daily and some delusions daily (stories about her mom in home) N/V:  No. Lightheaded:  some  Syncope: No. Diplopia:  Yes.   Dyskinesia:  No.    PREVIOUS MEDICATIONS:pramipexole; levodopa  ALLERGIES:   Allergies  Allergen Reactions   Adrenalone    Lactose Other (See Comments)    abd pain, lactose intolerant   Morphine Other (See Comments)   Sulfa Antibiotics Other (See Comments)     Headache, very sick   Codeine Other (See Comments)    Reaction:  Headaches and nightmares    Lactose Intolerance (Gi) Nausea And Vomiting   Latex Rash   Lyrica [Pregabalin] Swelling and Other (See Comments)    Reaction:  Leg swelling   Other Other (See Comments)    Pt states that pain medications give her nightmares.     Plaquenil [Hydroxychloroquine Sulfate] Other (See Comments)    Reaction:  GI upset    Reglan [Metoclopramide] Other (See Comments)    Reaction:  GI upset    Requip [Ropinirole Hcl] Other (See Comments)    Reaction:  GI upset    Septra [Sulfamethoxazole-Trimethoprim] Nausea And Vomiting   Shellfish Allergy Nausea And Vomiting    CURRENT MEDICATIONS:  Current Outpatient Medications  Medication Instructions   acetaminophen (TYLENOL) 500 mg, Oral, 3 times daily PRN, not to exceed 3 gm in 24 hours. As needed for arthritis pain   aspirin 81 mg, Oral, Daily, Swallow whole.   Calcium Carb-Cholecalciferol (CALCIUM 600/VITAMIN D3) 600-800 MG-UNIT TABS 1 tablet, Oral, Daily   carbidopa-levodopa (SINEMET IR) 25-100 MG tablet 2 tablets, Oral, 2 times daily   ciprofloxacin (CIPRO) 500 mg, Oral, 2 times daily   docusate sodium (COLACE) 100 mg, Oral, Daily at bedtime   famotidine (PEPCID) 20 mg, Oral, Daily   Ferrous Sulfate (SLOW FE) 142 (45 Fe) MG TBCR 1 tablet, Oral, Daily    furosemide (LASIX) 20 mg, Oral, Daily   hydroxychloroquine (PLAQUENIL) 200 mg, Oral, Daily   levothyroxine (SYNTHROID) 50 mcg, Oral, Daily before breakfast   lubiprostone (AMITIZA) 24 mcg, Oral, 2 times daily with meals   MULTIPLE MINERALS-VITAMINS PO 1 tablet, Oral, Daily   Multiple Vitamins-Minerals (PRESERVISION AREDS 2 PO) 1 tablet, Oral, Daily   nystatin (MYCOSTATIN/NYSTOP) powder Topical, 2 times daily PRN   pantoprazole (PROTONIX) 40 mg, Oral, Daily   Polyethyl Glycol-Propyl Glycol (SYSTANE) 0.4-0.3 % SOLN 1 drop, Ophthalmic, 2 times daily PRN   potassium citrate (UROCIT-K) 10 MEQ (1080 MG) SR  tablet 10 mEq, Oral, 2 times daily   pramipexole (MIRAPEX) 1 mg, Oral, 2 times daily   QUEtiapine (SEROQUEL) 25 mg, Oral, Daily   QUEtiapine (SEROQUEL) 12.5 mg, Oral, Daily   rosuvastatin (CRESTOR) 20 mg, Oral, Daily   saccharomyces boulardii (FLORASTOR) 250 mg, Oral, 2 times daily   senna-docusate (SENOKOT-S) 8.6-50 MG tablet 1 tablet, Oral, Daily at bedtime   sertraline (ZOLOFT) 75 mg, Oral, Daily at bedtime, 1-1/2 tablets to = 75 mg    Skin Protectants, Misc. (EUCERIN) cream 1 application, Topical, Daily   Spacer/Aero-Holding Chambers (AEROCHAMBER MV) inhaler Other, Use as instructed as needed    traMADol (ULTRAM) 50 mg, Oral, 3 times daily   Vitamin D 2,000 Units, Oral, Daily    Objective:   VITALS:   Vitals:  08/04/21 1323  BP: (!) 100/50  Pulse: 84  SpO2: 96%  Weight: 127 lb (57.6 kg)    GEN:  The patient appears stated age and is in NAD. HEENT:  Normocephalic, atraumatic.  The mucous membranes are moist. The superficial temporal arteries are without ropiness or tenderness. CV:  RRR with 3/6 sem Lungs:  CTAB Neck/HEME:  There are no carotid bruits bilaterally.  Neurological examination:  Orientation: The patient is alert and oriented x3.  She was actually able to provide her history quite well.  In fact, she was able to tell me that she met this examiner years ago at a lecture Cranial nerves: There is good facial symmetry. Extraocular muscles are intact. The visual fields are full to confrontational testing. The speech is fluent and clear. Soft palate rises symmetrically and there is no tongue deviation. Hearing is intact to conversational tone. Sensation: Sensation is intact to light and pinprick throughout (facial, trunk, extremities). Motor: Strength is 5/5 in the bilateral upper and lower extremities.   Shoulder shrug is equal and symmetric.  There is no pronator drift.   Movement examination: Tone: There is normal tone in the bilateral upper extremities.  The tone  in the lower extremities is normal.  Abnormal movements: None Coordination:  There is no decremation with RAM's, with any form of RAMS, including alternating supination and pronation of the forearm, hand opening and closing, finger taps, heel taps and toe taps. Gait and Station: The patient does require assistance out of the wheelchair.  She has some retropulsion with arising.  She is given a walker.  Gait is slow, tenuous, slightly short stepped. I have reviewed and interpreted the following labs independently   Chemistry      Component Value Date/Time   NA 140 04/12/2021 0005   K 4.2 04/12/2021 0005   CL 104 04/12/2021 0005   CL 110 07/24/2019 0000   CO2 25 (A) 04/12/2021 0005   CO2 24 07/24/2019 0000   BUN 18 04/12/2021 0005   CREATININE 0.9 04/12/2021 0005   CREATININE 1.01 (H) 11/22/2020 1109   CREATININE 0.65 05/26/2019 0906   GLU 87 04/12/2021 0005      Component Value Date/Time   CALCIUM 9.8 04/12/2021 0005   CALCIUM 9.0 07/24/2019 0000   ALKPHOS 78 04/12/2021 0005   AST 19 04/12/2021 0005   ALT 8 04/12/2021 0005   BILITOT 0.6 04/24/2020 0423   BILITOT 0.3 07/14/2019 0811      Lab Results  Component Value Date   TSH 1.62 04/12/2021   Lab Results  Component Value Date   WBC 6.7 04/12/2021   HGB 11.0 (A) 04/12/2021   HCT 34 (A) 04/12/2021   MCV 98.4 05/24/2020   PLT 282 04/12/2021     Total time spent on today's visit was 60 minutes, including both face-to-face time and nonface-to-face time.  Time included that spent on review of records (prior notes available to me/labs/imaging if pertinent), discussing treatment and goals, answering patient's questions and coordinating care.  Cc:  Amanda Dad, MD

## 2021-08-02 NOTE — Assessment & Plan Note (Signed)
takes Levothyroxine 107mcg qd. TSH 1.62 04/11/21

## 2021-08-02 NOTE — Telephone Encounter (Signed)
Per Dr. Megan Salon, patient scheduled for phone visit to review urine culture results. Husband confirmed appointment  Carlean Purl, RN

## 2021-08-02 NOTE — Progress Notes (Signed)
This encounter was created in error - please disregard.

## 2021-08-02 NOTE — Assessment & Plan Note (Signed)
takes Plaquenil '200mg'$  qd, Tylenol  f/u Rheumatology.

## 2021-08-02 NOTE — Assessment & Plan Note (Addendum)
Hx of rena lithiasis, recurrent UTI, urosepsis, off Keflex for suppression therapy, f/u Urology. On and off dysuria, UA C/S 05/23/21 P Aeruginosa >100,000c/ml susceptible to cephalosporins, ? Colonization, 05/26/21 placed on Cefdinir '300mg'$  bid x 7days. Urine culture 06/30/21 showed PS Aeruginosa >100,000c/ml, susceptible to Imipenem, Gentamicin, Tobromycin-ID recommended IVF Meropenem  completed 07/20/21 07/28/21 urine culture Pseudomonas aeruginosa .100,000c/ml, S: Cipro, Gent, Levofloxacin, Tobramycin. F/u ID 08/03/21 ID Dr. Simeon Craft: 7 day course of Cipro '500mg'$  bid, f/u 08/09/21

## 2021-08-02 NOTE — Assessment & Plan Note (Signed)
takes Pantoprazole '40mg'$  qd, Famotidine '20mg'$  qd. yearly FOBT per GI, Hgb 11.0 04/12/21

## 2021-08-02 NOTE — Assessment & Plan Note (Addendum)
c/o left mid back pain with deep breathing, movement, and palpation sustained from fall. No O2 desaturation, cough, or fever.     Hx of cosed fracture of multiple ribs of left side with routine healing, takes Tramadol, pain is better controlled.  Obtain X-ray left ribs. 08/03/21 X-ray nondisplaced fracture left 7th rib

## 2021-08-02 NOTE — Assessment & Plan Note (Signed)
Depression/hallucination, stabilizing, takes Sertraline '75mg'$  qd, Quetiapine was increased to '25mg'$  qhs, 12.'5mg'$  qam since 05/12/21

## 2021-08-02 NOTE — Assessment & Plan Note (Signed)
CHF/trace edema BLE, takes Furosemide '20mg'$  qd, cardiology, severe aortic valve stenosis. Bun/creat 18/0.89 04/11/21, Echocardiogram 11/15/20 EF 60-65%

## 2021-08-02 NOTE — Assessment & Plan Note (Signed)
takes Fe, Hgb 11 04/11/21

## 2021-08-02 NOTE — Assessment & Plan Note (Signed)
takes MiraPex '1mg'$  bid, Sinemet 25/100 II bid.

## 2021-08-02 NOTE — Assessment & Plan Note (Signed)
takes Senokot S I qd, Amitiza 62mg bid, Colace qd

## 2021-08-02 NOTE — Progress Notes (Addendum)
Location:   SNF [place] Room Number: 45 Place of Service:  SNF (31) Provider: [facility] Bryla Burek NP  [name], MD  Patient Care Team: [name], MD as PCP - General (Internal Medicine) [name], MD as PCP - Cardiology (Cardiology) [name], MD (Neurosurgery) [name], MD (Neurology) [name], MD (Inactive) as Consulting Physician (Gastroenterology) [name], MD as Consulting Physician (Urology) Fatimata Talsma X, NP as Nurse Practitioner (Internal Medicine)  Extended Emergency Contact Information Primary Emergency Contact: [facility] Address: [address]          [place], [place] [address] [name] of [place] Phone: [phone] Mobile Phone: [phone] Relation: Spouse Secondary Emergency Contact: [name] Mobile Phone: [phone] Relation: Daughter Preferred language: [name] Interpreter needed? No  Code Status: DNR Goals of care: Advanced Directive information Advanced Directives 07/28/2021  Does Patient Have a Medical Advance Directive? Yes  Type of Advance Directive Living will;Out of facility DNR (pink MOST or yellow form)  Does patient want to make changes to medical advance directive? No - Patient declined  Copy of [place] in Chart? -  Would patient like information on creating a medical advance directive? -  Pre-existing out of facility DNR order (yellow form or pink MOST form) Yellow form placed in chart (order not valid for inpatient use)     Chief Complaint  Patient presents with   Acute Visit    Left mid back pain, f/u UTI    HPI:  Pt is a 87 y.o. female seen today for an acute visit for c/o left mid back pain with deep breathing, movement, and palpation sustained from fall. No O2 desaturation, cough, or fever.     Hx of cosed fracture of multiple ribs of left side with routine healing, takes Tramadol, pain is better controlled.             Hx of rena  lithiasis, recurrent UTI, urosepsis, off Keflex for suppression therapy, f/u Urology. On and off dysuria, UA C/S 05/23/21 P Aeruginosa >100,000c/ml susceptible to cephalosporins, ? Colonization, 05/26/21 placed on Cefdinir '300mg'$  bid x 7days. Urine culture 06/30/21 showed PS Aeruginosa >100,000c/ml, susceptible to Imipenem, Gentamicin, Tobromycin. ID recommended IV ABT 7 day course of Meropenem,  completed 07/20/21             GERD, takes Pantoprazole '40mg'$  qd, Famotidine '20mg'$  qd. yearly FOBT per GI, Hgb 11.0 04/12/21             Parkinson's, takes MiraPex '1mg'$  bid, Sinemet 25/100 II bid.             Depression/hallucination, stabilizing, takes Sertraline '75mg'$  qd, Quetiapine was increased to '25mg'$  qhs, 12.'5mg'$  qam since 05/12/21             Constipation, takes Senokot S I qd, Amitiza 47mg bid, Colace qd             Hypothyroidism, takes Levothyroxine 537m qd. TSH 1.62 04/11/21             Erosive arthritis, takes Plaquenil '200mg'$  qd, Tylenol  f/u Rheumatology.              CHF/trace edema BLE, takes Furosemide '20mg'$  qd, cardiology, severe aortic valve stenosis. Bun/creat 18/0.89 04/11/21, Echocardiogram 11/15/20 EF 60-65%             Anemia, takes Fe, Hgb 11 04/11/21             AS Cardiology palliative approach  Past Medical History:  Diagnosis Date   Abnormal nuclear stress test    Actinic cheilitis 04/23/2019   Anemia    iron deficiency    Anxiety    Aortic stenosis    severe by echo 2021    Arthritis    Broken arm    right    Chronic diastolic CHF (congestive heart failure) ([place])    Chronic pain    Cognitive communication deficit    Constipation    Depression    Erosive (osteo)arthritis 03/09/2020   Family history of adverse reaction to anesthesia    pt. states sister vomits   Fibromyalgia    GERD (gastroesophageal reflux disease)    H/O hiatal hernia    Hallucinations, visual 07/21/2018   03/10/20 psych consult.    Hematuria 05/18/2020   History of bronchitis    History of kidney stones     Hypercholesterolemia    Hypertension    dr t turner   Hypothyroidism    Memory loss 04/21/2018   08/08/18 CT head no traumatic findings, atrophy.  09/27/19 MMSE 25/30, failed the clock drawing   Microhematuria 03/24/2015   Neuropathy    Osteoporosis    Parkinson disease ([place])    PVD (peripheral vascular disease) (HCC)    99% stenosis of left anteiror tibial artery, mod stenosis of left distal SFA and popliteal artery followed by [name]   RBBB    noted on EKG 2018   Restless leg syndrome    Sepsis ([place])    hx of due to Ecoli   Septic shock ([place]) 04/19/2020   Shortness of breath    with exertion - chronic    Sjogren's disease ([place])    Sjogren's syndrome ([place]) 02/04/2018   04/16/19 rheumatology: Erosive OA of hands, Sjogrens syndrome, low back pain at multiple sites, recurrent kidney stones, age related osteoporosis w/o current pathological fracture, f/u 6 months.    SOB (shortness of breath)    chronic due to diastolic dysfunction, deconditioning, obesity   Spinal stenosis    lumbar region    Tremor    Unstable gait 04/21/2018   Unsteadiness on feet    Urinary frequency 09/18/2018   Urinary tract infection    Visual hallucinations    Past Surgical History:  Procedure Laterality Date   ABDOMINAL HYSTERECTOMY     BACK SURGERY     4 back surgeries,   lumbar fusion   CARDIAC CATHETERIZATION  2006   normal   CYSTOSCOPY/URETEROSCOPY/HOLMIUM LASER/STENT PLACEMENT Left 01/06/2019   Procedure: CYSTOSCOPY/RETROGRADE/URETEROSCOPY/HOLMIUM LASER/BASKET RETRIEVAL/STENT PLACEMENT;  Surgeon: Ceasar Mons, MD;  Location: [facility];  Service: Urology;  Laterality: Left;   CYSTOSCOPY/URETEROSCOPY/HOLMIUM LASER/STENT PLACEMENT Bilateral 05/24/2020   Procedure: CYSTOSCOPY/RETROGRADE/URETEROSCOPY/HOLMIUM LASER/STENT PLACEMENT;  Surgeon: Ceasar Mons, MD;  Location: [facility];  Service: Urology;  Laterality: Bilateral;   EYE SURGERY Bilateral    cateracts   falls     variious  fall, broken wrist,and toes   FRACTURE SURGERY Right April 2016   Wrist, Pt. fell   IR NEPHROSTOMY PLACEMENT RIGHT  04/19/2020   kidney stone removal Left 01/20/2019   RIGHT/LEFT HEART CATH AND CORONARY ANGIOGRAPHY N/A 02/05/2018   Procedure: RIGHT/LEFT HEART CATH AND CORONARY ANGIOGRAPHY;  Surgeon: Troy Sine, MD;  Location: [facility];  Service: Cardiovascular;  Laterality: N/A;   SPINAL CORD STIMULATOR INSERTION N/A 09/09/2015   Procedure: LUMBAR SPINAL CORD STIMULATOR INSERTION;  Surgeon: Clydell Hakim, MD;  Location: [facility];  Service: Neurosurgery;  Laterality:  N/A;  LUMBAR SPINAL CORD STIMULATOR INSERTION   SPINE SURGERY  April 2013   Back X's 4   TOTAL HIP ARTHROPLASTY     right   WRIST FRACTURE SURGERY Bilateral     Allergies  Allergen Reactions   Adrenalone    Lactose Other (See Comments)    abd pain, lactose intolerant   Morphine Other (See Comments)   Sulfa Antibiotics Other (See Comments)    Headache, very sick   Codeine Other (See Comments)    Reaction:  Headaches and nightmares    Lactose Intolerance (Gi) Nausea And Vomiting   Latex Rash   Lyrica [Pregabalin] Swelling and Other (See Comments)    Reaction:  Leg swelling   Other Other (See Comments)    Pt states that pain medications give her nightmares.     Plaquenil [Hydroxychloroquine Sulfate] Other (See Comments)    Reaction:  GI upset    Reglan [Metoclopramide] Other (See Comments)    Reaction:  GI upset    Requip [Ropinirole Hcl] Other (See Comments)    Reaction:  GI upset    Septra [Sulfamethoxazole-Trimethoprim] Nausea And Vomiting   Shellfish Allergy Nausea And Vomiting    Allergies as of 08/02/2021       Reactions   Adrenalone    Lactose Other (See Comments)   abd pain, lactose intolerant   Morphine Other (See Comments)   Sulfa Antibiotics Other (See Comments)   Headache, very sick   Codeine Other (See Comments)   Reaction:  Headaches and nightmares    Lactose Intolerance (gi)  Nausea And Vomiting   Latex Rash   Lyrica [pregabalin] Swelling, Other (See Comments)   Reaction:  Leg swelling   Other Other (See Comments)   Pt states that pain medications give her nightmares.     Plaquenil [hydroxychloroquine Sulfate] Other (See Comments)   Reaction:  GI upset    Reglan [metoclopramide] Other (See Comments)   Reaction:  GI upset    Requip [ropinirole Hcl] Other (See Comments)   Reaction:  GI upset    Septra [sulfamethoxazole-trimethoprim] Nausea And Vomiting   Shellfish Allergy Nausea And Vomiting        Medication List        Accurate as of August 02, 2021 11:59 PM. If you have any questions, ask your nurse or doctor.          acetaminophen 500 MG tablet Commonly known as: TYLENOL Take 500 mg by mouth 3 (three) times daily as needed for mild pain. not to exceed 3 gm in 24 hours. As needed for arthritis pain   AeroChamber MV inhaler by Other route. Use as instructed as needed   aspirin 81 MG EC tablet Take 81 mg by mouth daily. Swallow whole.   Calcium 600/Vitamin D3 600-800 MG-UNIT Tabs Generic drug: Calcium Carb-Cholecalciferol Take 1 tablet by mouth daily.   carbidopa-levodopa 25-100 MG tablet Commonly known as: SINEMET IR Take 1 tablet by mouth 3 (three) times daily.   docusate sodium 100 MG capsule Commonly known as: COLACE Take 100 mg by mouth at bedtime.   eucerin cream Apply 1 application topically daily.   famotidine 20 MG tablet Commonly known as: PEPCID Take 20 mg by mouth daily.   furosemide 20 MG tablet Commonly known as: LASIX Take 20 mg by mouth daily as needed.   furosemide 20 MG tablet Commonly known as: LASIX Take 1 tablet (20 mg total) by mouth daily.   hydroxychloroquine 200 MG tablet Commonly known as:  PLAQUENIL Take 200 mg by mouth daily.   levothyroxine 50 MCG tablet Commonly known as: SYNTHROID Take 50 mcg by mouth daily before breakfast.   lubiprostone 24 MCG capsule Commonly known as:  AMITIZA Take 24 mcg by mouth 2 (two) times daily with a meal.   MULTIPLE MINERALS-VITAMINS PO Take 1 tablet by mouth daily.   nystatin powder Commonly known as: MYCOSTATIN/NYSTOP Apply topically 2 (two) times daily as needed.   pantoprazole 40 MG tablet Commonly known as: PROTONIX Take 40 mg by mouth daily.   potassium citrate 10 MEQ (1080 MG) SR tablet Commonly known as: UROCIT-K Take 10 mEq by mouth 2 (two) times daily.   pramipexole 1 MG tablet Commonly known as: MIRAPEX Take 1 mg by mouth 2 (two) times daily.   PRESERVISION AREDS 2 PO Take 1 tablet by mouth daily.   QUEtiapine 25 MG tablet Commonly known as: SEROQUEL Take 25 mg by mouth daily.   QUEtiapine 25 MG tablet Commonly known as: SEROQUEL Take 12.5 mg by mouth daily.   rosuvastatin 20 MG tablet Commonly known as: CRESTOR Take 20 mg by mouth daily.   saccharomyces boulardii 250 MG capsule Commonly known as: FLORASTOR Take 250 mg by mouth daily.   senna-docusate 8.6-50 MG tablet Commonly known as: Senokot-S Take 1 tablet by mouth at bedtime.   sertraline 50 MG tablet Commonly known as: ZOLOFT Take 75 mg by mouth at bedtime. 1-1/2 tablets to = 75 mg   Slow Fe 142 (45 Fe) MG Tbcr Generic drug: Ferrous Sulfate Take 1 tablet by mouth. Daily   Systane 0.4-0.3 % Soln Generic drug: Polyethyl Glycol-Propyl Glycol Apply 1 drop to eye 2 (two) times daily as needed (dry eyes).   traMADol 50 MG tablet Commonly known as: ULTRAM Take 1 tablet (50 mg total) by mouth 3 (three) times daily.   Vitamin D 50 MCG (2000 UT) tablet Take 2,000 Units by mouth daily.        Review of Systems  Constitutional:  Negative for activity change, appetite change and fever.  HENT:  Positive for hearing loss. Negative for congestion and voice change.   Eyes:  Negative for visual disturbance.  Respiratory:  Positive for shortness of breath. Negative for cough.        Chronic DOE  Cardiovascular:  Positive for leg  swelling.  Gastrointestinal:  Negative for abdominal pain, constipation, nausea and vomiting.       Supra pubic discomfort comes and goes.   Genitourinary:  Positive for dysuria and frequency. Negative for hematuria and urgency.       New urinary incontinent episode  Musculoskeletal:  Positive for arthralgias, back pain, gait problem and myalgias.       Chronic lower back pain. The R 3rd finger mid knuckle pain is chronic. Left mid back pain and bruise.   Skin:  Negative for color change.  Neurological:  Positive for tremors. Negative for speech difficulty, weakness and headaches.       Memory lapses. Minimal resting tremor in fingers, not disabling.   Psychiatric/Behavioral:  Positive for hallucinations. Negative for behavioral problems and sleep disturbance. The patient is nervous/anxious.        More visual hallucination, anxious.    Immunization History  Administered Date(s) Administered   Hepatitis A, Adult 03/01/1997, 09/20/1997   Influenza Split 09/09/2013, 09/27/2014   Influenza, High Dose Seasonal PF 08/01/2011, 08/27/2012, 10/14/2013, 09/08/2015, 09/10/2016, 09/12/2017, 09/12/2018, 09/15/2019   Influenza,inj,Quad PF,6+ Mos 09/11/2016   Influenza-Unspecified 09/21/2020   Moderna Sars-Covid-2  Vaccination 12/12/2019, 01/09/2020, 10/18/2020   Pneumococcal Conjugate-13 04/15/2014   Pneumococcal Polysaccharide-23 09/28/2005   Td 06/20/2016   Tdap 04/29/2006, 09/01/2015   Zoster Recombinat (Shingrix) 06/11/2018, 09/10/2018   Zoster, Live 04/29/2006   Pertinent  Health Maintenance Due  Topic Date Due   INFLUENZA VACCINE  07/10/2021   DEXA SCAN  Completed   PNA vac Low Risk Adult  Completed   Fall Risk  08/03/2021 07/13/2021 11/16/2019 04/23/2019 04/09/2019  Falls in the past year? 1 0 0 0 0  Comment - - - - -  Number falls in past yr: 0 0 - 0 0  Injury with Fall? 1 0 - 0 0  Comment - - - - -  Risk for fall due to : - - - - -  Risk for fall due to: Comment - - - - -   Functional  Status Survey:    Vitals:   08/02/21 1239  BP: (!) 90/52  Pulse: 72  Resp: 18  Temp: 98 F (36.7 C)  SpO2: 99%  Weight: 128 lb (58.1 kg)   Body mass index is 26.75 kg/m. Physical Exam Vitals and nursing note reviewed.  Constitutional:      Appearance: Normal appearance.  HENT:     Head: Normocephalic and atraumatic.     Mouth/Throat:     Mouth: Mucous membranes are moist.  Eyes:     Extraocular Movements: Extraocular movements intact.     Conjunctiva/sclera: Conjunctivae normal.     Pupils: Pupils are equal, round, and reactive to light.  Cardiovascular:     Rate and Rhythm: Normal rate and regular rhythm.     Heart sounds: Murmur heard.  Pulmonary:     Breath sounds: No rhonchi or rales.  Abdominal:     General: Bowel sounds are normal.     Palpations: Abdomen is soft.     Tenderness: no abdominal tenderness There is no right CVA tenderness, left CVA tenderness, guarding or rebound.  Musculoskeletal:        General: Tenderness present.     Cervical back: Normal range of motion and neck supple.     Right lower leg: Edema present.     Left lower leg: Edema present.     Comments: Trace edema BLE.  Arthritic changes in fingers, R>L. The R 3rd finger mid knuckle swelling, no heat or redness. Left ankle pain, in brace. Chronic lower back pain.  Skin:    General: Skin is warm and dry.     Findings: Bruising present.     Comments: A palm sized left mid back bruise, pain with deep breathing, palpation, and movement.   Neurological:     General: No focal deficit present.     Mental Status: She is alert. Mental status is at baseline.     Gait: Gait abnormal.     Comments: Oriented to person, place. Ambulates with walker.   Psychiatric:        Mood and Affect: Mood normal.        Behavior: Behavior normal.     Comments: Visual hallucination vs delusion, anxiety    Labs reviewed: Recent Labs    10/05/20 0000 11/22/20 1109 04/12/21 0005  NA 142 141 140  K 4.2 4.2  4.2  CL 107 104 104  CO2 25* 22 25*  GLUCOSE  --  104*  --   BUN '17 17 18  '$ CREATININE 0.9 1.01* 0.9  CALCIUM 9.6 10.0 9.8   Recent Labs  10/05/20 0000 04/12/21 0005  AST 19 19  ALT 15 8  ALKPHOS 62 78  ALBUMIN 3.5 4.1   Recent Labs    10/05/20 0000 04/12/21 0005  WBC 4.8 6.7  NEUTROABS 2,328.00 4,147.00  HGB 9.6* 11.0*  HCT 30* 34*  PLT 288 282   Lab Results  Component Value Date   TSH 1.62 04/12/2021   No results found for: HGBA1C Lab Results  Component Value Date   CHOL 105 10/05/2020   HDL 46 10/05/2020   LDLCALC 38 10/05/2020   TRIG 119 10/05/2020   CHOLHDL 2.4 07/14/2019    Significant Diagnostic Results in last 30 days:  No results found.  Assessment/Plan: Mid back pain on left side c/o left mid back pain with deep breathing, movement, and palpation sustained from fall. No O2 desaturation, cough, or fever.     Hx of cosed fracture of multiple ribs of left side with routine healing, takes Tramadol, pain is better controlled.  Obtain X-ray left ribs. 08/03/21 X-ray nondisplaced fracture left 7th rib  Recurrent UTI Hx of rena lithiasis, recurrent UTI, urosepsis, off Keflex for suppression therapy, f/u Urology. On and off dysuria, UA C/S 05/23/21 P Aeruginosa >100,000c/ml susceptible to cephalosporins, ? Colonization, 05/26/21 placed on Cefdinir '300mg'$  bid x 7days. Urine culture 06/30/21 showed PS Aeruginosa >100,000c/ml, susceptible to Imipenem, Gentamicin, Tobromycin-ID recommended IVF Meropenem  completed 07/20/21 07/28/21 urine culture Pseudomonas aeruginosa .100,000c/ml, S: Cipro, Gent, Levofloxacin, Tobramycin. F/u ID 08/03/21 ID Dr. Simeon Craft: 7 day course of Cipro '500mg'$  bid, f/u 08/09/21  GERD (gastroesophageal reflux disease) takes Pantoprazole '40mg'$  qd, Famotidine '20mg'$  qd. yearly FOBT per GI, Hgb 11.0 04/12/21  Parkinson's disease takes MiraPex '1mg'$  bid, Sinemet 25/100 II bid.  Depression, psychotic ([place]) Depression/hallucination, stabilizing, takes  Sertraline '75mg'$  qd, Quetiapine was increased to '25mg'$  qhs, 12.'5mg'$  qam since 05/12/21  Chronic constipation takes Senokot S I qd, Amitiza 20mg bid, Colace qd  Hypothyroidism takes Levothyroxine 564m qd. TSH 1.62 04/11/21  Erosive (osteo)arthritis  takes Plaquenil '200mg'$  qd, Tylenol  f/u Rheumatology.   Chronic diastolic CHF (congestive heart failure) ([facility]) CHF/trace edema BLE, takes Furosemide '20mg'$  qd, cardiology, severe aortic valve stenosis. Bun/creat 18/0.89 04/11/21, Echocardiogram 11/15/20 EF 60-65%  Iron deficiency anemia  takes Fe, Hgb 11 04/11/21  Aortic stenosis AS Cardiology palliative approach  Fracture seven ribs-closed, left, initial encounter 08/03/21 X-ray nondisplaced fracture left 7th rib, pain management.    Orthostasis May consider Midodrine if falls, low Bp persist    Family/ staff Communication: plan of care reviewed with the patient and charge nurse.   Labs/tests ordered:  X-ray left ribs  Time spend 35 minutes.

## 2021-08-03 ENCOUNTER — Encounter: Payer: Self-pay | Admitting: Internal Medicine

## 2021-08-03 ENCOUNTER — Telehealth (INDEPENDENT_AMBULATORY_CARE_PROVIDER_SITE_OTHER): Payer: Medicare Other | Admitting: Internal Medicine

## 2021-08-03 ENCOUNTER — Other Ambulatory Visit: Payer: Self-pay

## 2021-08-03 DIAGNOSIS — N390 Urinary tract infection, site not specified: Secondary | ICD-10-CM

## 2021-08-03 DIAGNOSIS — S2232XA Fracture of one rib, left side, initial encounter for closed fracture: Secondary | ICD-10-CM | POA: Diagnosis not present

## 2021-08-03 DIAGNOSIS — R0781 Pleurodynia: Secondary | ICD-10-CM | POA: Diagnosis not present

## 2021-08-03 MED ORDER — CIPROFLOXACIN HCL 500 MG PO TABS: 500.0000 mg | ORAL_TABLET | Freq: Two times a day (BID) | ORAL | Status: AC

## 2021-08-03 NOTE — Progress Notes (Signed)
Virtual Visit via Telephone Note  I connected with [name] on 08/03/21 at  9:45 AM EDT by telephone and verified that I am speaking with the correct person using two identifiers.  Location: Patient: [facility] Provider: [facility]   I discussed the limitations, risks, security and privacy concerns of performing an evaluation and management service by telephone and the availability of in person appointments. I also discussed with the patient that there may be a patient responsible charge related to this service. The patient expressed understanding and agreed to proceed.   History of Present Illness: I called and spoke with [name] and her husband today.  She continues to be bothered by dysuria despite a recent 7-day course of IV ertapenem for Pseudomonas UTI.  A repeat urine culture on 07/28/2021 grew Pseudomonas again.  That isolate was susceptible to ciprofloxacin.  She says that the dysuria is most bothersome when she first gets up in the morning.  She has not had any fever.  She says that she is not having diarrhea anymore.  She had a fall recently and bruised her ribs.  She is due to have a chest x-ray today to see if she has any broken ribs.   Observations/Objective:   Assessment and Plan: She has some residual dysuria.  I recommend a 7-day course of oral ciprofloxacin  Follow Up Instructions: Ciprofloxacin 500 mg twice daily by mouth for 7 days I will arrange follow-up on 08/09/2021   I discussed the assessment and treatment plan with the patient. The patient was provided an opportunity to ask questions and all were answered. The patient agreed with the plan and demonstrated an understanding of the instructions.   The patient was advised to call back or seek an in-person evaluation if the symptoms worsen or if the condition fails to improve as anticipated.  I provided 15 minutes of non-face-to-face time during this encounter.   Michel Bickers, MD

## 2021-08-04 ENCOUNTER — Ambulatory Visit (INDEPENDENT_AMBULATORY_CARE_PROVIDER_SITE_OTHER): Payer: Medicare Other | Admitting: Neurology

## 2021-08-04 ENCOUNTER — Encounter: Payer: Self-pay | Admitting: Nurse Practitioner

## 2021-08-04 ENCOUNTER — Encounter: Payer: Self-pay | Admitting: Neurology

## 2021-08-04 ENCOUNTER — Other Ambulatory Visit: Payer: Self-pay

## 2021-08-04 VITALS — BP 100/50 | HR 84 | Wt 127.0 lb

## 2021-08-04 DIAGNOSIS — S2242XA Multiple fractures of ribs, left side, initial encounter for closed fracture: Secondary | ICD-10-CM | POA: Insufficient documentation

## 2021-08-04 DIAGNOSIS — R441 Visual hallucinations: Secondary | ICD-10-CM

## 2021-08-04 DIAGNOSIS — G214 Vascular parkinsonism: Secondary | ICD-10-CM

## 2021-08-04 NOTE — Assessment & Plan Note (Addendum)
08/03/21 X-ray nondisplaced fracture left 7th rib, pain management.

## 2021-08-04 NOTE — Assessment & Plan Note (Signed)
May consider Midodrine if falls, low Bp persist

## 2021-08-04 NOTE — Patient Instructions (Addendum)
Decrease pramipexole 0.5 mg bid x 1 month then 0.5 mg q hs x 1 month and then d/c pramipexole 2.  Follow up in 2-3 months - do NOT take carbidopa/levodopa 24 hours prior to next appt  The physicians and staff at [facility] are committed to providing excellent care. You may receive a survey requesting feedback about your experience at our office. We strive to receive "very good" responses to the survey questions. If you feel that your experience would prevent you from giving the office a "very good " response, please contact our office to try to remedy the situation. We may be reached at [phone]. Thank you for taking the time out of your busy day to complete the survey.

## 2021-08-07 ENCOUNTER — Other Ambulatory Visit: Payer: Self-pay

## 2021-08-07 MED ORDER — TRAMADOL HCL 50 MG PO TABS: 50.0000 mg | ORAL_TABLET | Freq: Three times a day (TID) | ORAL | 0 refills | Status: DC

## 2021-08-07 NOTE — Telephone Encounter (Signed)
Refill request received from [facility] for Ultram '50mg'$  take 1/2 tablet three times a day. The directions on medication list is for Ultram '50mg'$  take one tablet three times a day. Please clarify directions and send to pharmacy. Sent to Man X [name], NP for clarification and approval.

## 2021-08-09 ENCOUNTER — Ambulatory Visit (INDEPENDENT_AMBULATORY_CARE_PROVIDER_SITE_OTHER): Payer: Medicare Other | Admitting: Internal Medicine

## 2021-08-09 ENCOUNTER — Other Ambulatory Visit: Payer: Self-pay

## 2021-08-09 DIAGNOSIS — R1312 Dysphagia, oropharyngeal phase: Secondary | ICD-10-CM | POA: Diagnosis not present

## 2021-08-09 DIAGNOSIS — N390 Urinary tract infection, site not specified: Secondary | ICD-10-CM

## 2021-08-09 DIAGNOSIS — R41841 Cognitive communication deficit: Secondary | ICD-10-CM | POA: Diagnosis not present

## 2021-08-09 DIAGNOSIS — R1310 Dysphagia, unspecified: Secondary | ICD-10-CM | POA: Diagnosis not present

## 2021-08-09 NOTE — Progress Notes (Signed)
Virtual Visit via Telephone Note  I connected with [name] on 08/09/21 at 10:30 AM EDT by telephone and verified that I am speaking with the correct person using two identifiers.  Location: Patient: [facility] Provider: [facility]   I discussed the limitations, risks, security and privacy concerns of performing an evaluation and management service by telephone and the availability of in person appointments. I also discussed with the patient that there may be a patient responsible charge related to this service. The patient expressed understanding and agreed to proceed.   History of Present Illness: I called and spoke with [name] and her husband today.  I recommended that she start oral ciprofloxacin on 08/03/2021 after she failed to have improvement in her dysuria with a course of IV meropenem.  Fortunately she is feeling much better now.  Her dysuria has resolved.  She has not had any further diarrhea.   Observations/Objective:   Assessment and Plan: She has responded to oral ciprofloxacin.  Follow Up Instructions: She will complete a 7-day course of ciprofloxacin and follow-up with me as needed   I discussed the assessment and treatment plan with the patient. The patient was provided an opportunity to ask questions and all were answered. The patient agreed with the plan and demonstrated an understanding of the instructions.   The patient was advised to call back or seek an in-person evaluation if the symptoms worsen or if the condition fails to improve as anticipated.  I provided 13 minutes of non-face-to-face time during this encounter.   Michel Bickers, MD

## 2021-08-22 DIAGNOSIS — R29898 Other symptoms and signs involving the musculoskeletal system: Secondary | ICD-10-CM | POA: Diagnosis not present

## 2021-08-22 DIAGNOSIS — N3941 Urge incontinence: Secondary | ICD-10-CM | POA: Diagnosis not present

## 2021-08-22 DIAGNOSIS — M6281 Muscle weakness (generalized): Secondary | ICD-10-CM | POA: Diagnosis not present

## 2021-08-22 DIAGNOSIS — M25522 Pain in left elbow: Secondary | ICD-10-CM | POA: Diagnosis not present

## 2021-08-23 DIAGNOSIS — M6281 Muscle weakness (generalized): Secondary | ICD-10-CM | POA: Diagnosis not present

## 2021-08-23 DIAGNOSIS — N3941 Urge incontinence: Secondary | ICD-10-CM | POA: Diagnosis not present

## 2021-08-23 DIAGNOSIS — M25522 Pain in left elbow: Secondary | ICD-10-CM | POA: Diagnosis not present

## 2021-08-23 DIAGNOSIS — R29898 Other symptoms and signs involving the musculoskeletal system: Secondary | ICD-10-CM | POA: Diagnosis not present

## 2021-08-24 DIAGNOSIS — N3941 Urge incontinence: Secondary | ICD-10-CM | POA: Diagnosis not present

## 2021-08-24 DIAGNOSIS — M25522 Pain in left elbow: Secondary | ICD-10-CM | POA: Diagnosis not present

## 2021-08-24 DIAGNOSIS — R29898 Other symptoms and signs involving the musculoskeletal system: Secondary | ICD-10-CM | POA: Diagnosis not present

## 2021-08-24 DIAGNOSIS — M6281 Muscle weakness (generalized): Secondary | ICD-10-CM | POA: Diagnosis not present

## 2021-08-29 DIAGNOSIS — R29898 Other symptoms and signs involving the musculoskeletal system: Secondary | ICD-10-CM | POA: Diagnosis not present

## 2021-08-29 DIAGNOSIS — N3941 Urge incontinence: Secondary | ICD-10-CM | POA: Diagnosis not present

## 2021-08-29 DIAGNOSIS — M25522 Pain in left elbow: Secondary | ICD-10-CM | POA: Diagnosis not present

## 2021-08-29 DIAGNOSIS — M6281 Muscle weakness (generalized): Secondary | ICD-10-CM | POA: Diagnosis not present

## 2021-08-29 DIAGNOSIS — Z23 Encounter for immunization: Secondary | ICD-10-CM | POA: Diagnosis not present

## 2021-08-31 DIAGNOSIS — R29898 Other symptoms and signs involving the musculoskeletal system: Secondary | ICD-10-CM | POA: Diagnosis not present

## 2021-08-31 DIAGNOSIS — M6281 Muscle weakness (generalized): Secondary | ICD-10-CM | POA: Diagnosis not present

## 2021-08-31 DIAGNOSIS — M25522 Pain in left elbow: Secondary | ICD-10-CM | POA: Diagnosis not present

## 2021-08-31 DIAGNOSIS — N3941 Urge incontinence: Secondary | ICD-10-CM | POA: Diagnosis not present

## 2021-09-01 DIAGNOSIS — R29898 Other symptoms and signs involving the musculoskeletal system: Secondary | ICD-10-CM | POA: Diagnosis not present

## 2021-09-01 DIAGNOSIS — M25522 Pain in left elbow: Secondary | ICD-10-CM | POA: Diagnosis not present

## 2021-09-01 DIAGNOSIS — M6281 Muscle weakness (generalized): Secondary | ICD-10-CM | POA: Diagnosis not present

## 2021-09-01 DIAGNOSIS — N3941 Urge incontinence: Secondary | ICD-10-CM | POA: Diagnosis not present

## 2021-09-05 DIAGNOSIS — L814 Other melanin hyperpigmentation: Secondary | ICD-10-CM | POA: Diagnosis not present

## 2021-09-05 DIAGNOSIS — M25522 Pain in left elbow: Secondary | ICD-10-CM | POA: Diagnosis not present

## 2021-09-05 DIAGNOSIS — M6281 Muscle weakness (generalized): Secondary | ICD-10-CM | POA: Diagnosis not present

## 2021-09-05 DIAGNOSIS — Z85068 Personal history of other malignant neoplasm of small intestine: Secondary | ICD-10-CM | POA: Diagnosis not present

## 2021-09-05 DIAGNOSIS — L570 Actinic keratosis: Secondary | ICD-10-CM | POA: Diagnosis not present

## 2021-09-05 DIAGNOSIS — N3941 Urge incontinence: Secondary | ICD-10-CM | POA: Diagnosis not present

## 2021-09-05 DIAGNOSIS — L821 Other seborrheic keratosis: Secondary | ICD-10-CM | POA: Diagnosis not present

## 2021-09-05 DIAGNOSIS — R29898 Other symptoms and signs involving the musculoskeletal system: Secondary | ICD-10-CM | POA: Diagnosis not present

## 2021-09-07 ENCOUNTER — Non-Acute Institutional Stay (SKILLED_NURSING_FACILITY): Payer: Medicare Other | Admitting: Orthopedic Surgery

## 2021-09-07 ENCOUNTER — Encounter: Payer: Self-pay | Admitting: Orthopedic Surgery

## 2021-09-07 DIAGNOSIS — R441 Visual hallucinations: Secondary | ICD-10-CM | POA: Diagnosis not present

## 2021-09-07 DIAGNOSIS — W19XXXA Unspecified fall, initial encounter: Secondary | ICD-10-CM

## 2021-09-07 DIAGNOSIS — N200 Calculus of kidney: Secondary | ICD-10-CM

## 2021-09-07 DIAGNOSIS — K219 Gastro-esophageal reflux disease without esophagitis: Secondary | ICD-10-CM | POA: Diagnosis not present

## 2021-09-07 DIAGNOSIS — I5032 Chronic diastolic (congestive) heart failure: Secondary | ICD-10-CM | POA: Diagnosis not present

## 2021-09-07 DIAGNOSIS — M545 Low back pain, unspecified: Secondary | ICD-10-CM | POA: Diagnosis not present

## 2021-09-07 DIAGNOSIS — M25551 Pain in right hip: Secondary | ICD-10-CM

## 2021-09-07 DIAGNOSIS — K5909 Other constipation: Secondary | ICD-10-CM | POA: Diagnosis not present

## 2021-09-07 DIAGNOSIS — M5440 Lumbago with sciatica, unspecified side: Secondary | ICD-10-CM

## 2021-09-07 DIAGNOSIS — F323 Major depressive disorder, single episode, severe with psychotic features: Secondary | ICD-10-CM | POA: Diagnosis not present

## 2021-09-07 DIAGNOSIS — G20 Parkinson's disease: Secondary | ICD-10-CM | POA: Diagnosis not present

## 2021-09-07 DIAGNOSIS — E039 Hypothyroidism, unspecified: Secondary | ICD-10-CM | POA: Diagnosis not present

## 2021-09-07 DIAGNOSIS — M6281 Muscle weakness (generalized): Secondary | ICD-10-CM | POA: Diagnosis not present

## 2021-09-07 DIAGNOSIS — R29898 Other symptoms and signs involving the musculoskeletal system: Secondary | ICD-10-CM | POA: Diagnosis not present

## 2021-09-07 DIAGNOSIS — D508 Other iron deficiency anemias: Secondary | ICD-10-CM | POA: Diagnosis not present

## 2021-09-07 DIAGNOSIS — N3941 Urge incontinence: Secondary | ICD-10-CM | POA: Diagnosis not present

## 2021-09-07 DIAGNOSIS — M25522 Pain in left elbow: Secondary | ICD-10-CM | POA: Diagnosis not present

## 2021-09-07 DIAGNOSIS — G8929 Other chronic pain: Secondary | ICD-10-CM

## 2021-09-07 NOTE — Progress Notes (Signed)
Location:   [facility] Room Number: 45 Place of Service:  SNF (31) Provider:  Windell Moulding, NP    Patient Care Team: [name], MD as PCP - General (Internal Medicine) [name], MD as PCP - Cardiology (Cardiology) [name], MD (Neurosurgery) [name], MD (Neurology) [name], MD (Inactive) as Consulting Physician (Gastroenterology) [name], MD as Consulting Physician (Urology) [name], Man X, NP as Nurse Practitioner (Internal Medicine) Tat, Eustace Quail, DO as Consulting Physician (Neurology)  Extended Emergency Contact Information Primary Emergency Contact: [facility] Address: [address]          [place], [place] [address] [name] of [place] Phone: [phone] Mobile Phone: [phone] Relation: Spouse Secondary Emergency Contact: [name] Mobile Phone: [phone] Relation: Daughter Preferred language: [name] Interpreter needed? No  Code Status:  FULL CODE Goals of care: Advanced Directive information Advanced Directives 09/07/2021  Does Patient Have a Medical Advance Directive? Yes  Type of Advance Directive Living will;Out of facility DNR (pink MOST or yellow form)  Does patient want to make changes to medical advance directive? No - Patient declined  Copy of [place] in Chart? -  Would patient like information on creating a medical advance directive? -  Pre-existing out of facility DNR order (yellow form or pink MOST form) -     Chief Complaint  Patient presents with   Medical Management of Chronic Issues    Routine Visit.    Immunizations    Discuss the need for Influenza vaccine.     HPI:  Pt is a 87 y.o. female seen today for medical management of chronic diseases.    She currently resides on the skilled nursing unit at [facility]. Past medical history includes: aortic stenosis, CHF, HTN, PVD, RBBB, barrett esophagus,  constipation, GERD, hypothyroidism, parkinson's, OA, osteoporosis, CKD stage III, chronic back pain, unstable gait, iron deficiency anemia, and memory loss.    09/27 she was found on the bathroom floor by staff. She could not explain how she ended on the floor. No apparent injury, vitals signs and neuro checks normal. Today, she reports progressive pain to back and right hip. Pain rated 5/10, described as sharp. Aggravated by ambulation. She is walking less in the last 2 days. Scheduled tylenol and tramadol unsuccessful in relieving pain. History of multiple rib fractures in past. Ambulates with walker, uses wheelchair for long distances.    CHF- LV EF 60-65 % 2021, followed by cardiology, no weight fluctuations, reports some sob with exertion and ankle edema, furosemide 20 mg daily- BUN/creat 18/0.9 04/2021 Recurrent UTI- Hx of renal lithiasis, recurrent UTI, urosepsis, off Keflex for suppression therapy, f/u Urology- scheduled 09/08/2021. Intermittent dysuria, UA C/S 05/23/21 P Aeruginosa >100,000c/ml susceptible to cephalosporins, 05/26/21 placed on Cefdinir 300mg  bid x 7days. Urine culture 06/30/21 showed PS Aeruginosa >100,000c/ml, susceptible to Imipenem, Gentamicin, Tobromycin. ID recommended IV ABT 7 day course of Cipro- symptoms improved/ ID signed off GERD- hgb 11.0 04/2021, Pepcid and Protonix Parkinson's- followed by Dr. Carles Collet- seen 08/26, plan to wean off pramipexole within the next month to decrease hallucinations/delusions, remains on carbidopa/levodopa Depression- denies increased depression/blues, Seroquel and Zoloft Hallucinations- occurring less often per husband, Seroquel Constipation- LBM today, remains on senna and miralax Hypothyroidism- TSH 1.62 04/2021, levothyroxine Anemia- hgb 11.0 04/2021, ferrous sulfate daily  Recent blood pressures:  09/29- 116/60  09/28- 94/51, 109/86  09/27- 113/55  Recent weights:  09/29- 127.8 lbs  09/01- 128.5  lbs  08/01- 131.2  lbs          Past Medical History:  Diagnosis Date   Abnormal nuclear stress test    Actinic cheilitis 04/23/2019   Anemia    iron deficiency    Anxiety    Aortic stenosis    severe by echo 2021    Arthritis    Broken arm    right    Chronic diastolic CHF (congestive heart failure) ([place])    Chronic pain    Cognitive communication deficit    Constipation    Depression    Erosive (osteo)arthritis 03/09/2020   Family history of adverse reaction to anesthesia    pt. states sister vomits   Fibromyalgia    GERD (gastroesophageal reflux disease)    H/O hiatal hernia    Hallucinations, visual 07/21/2018   03/10/20 psych consult.    Hematuria 05/18/2020   History of bronchitis    History of kidney stones    Hypercholesterolemia    Hypertension    dr t turner   Hypothyroidism    Memory loss 04/21/2018   08/08/18 CT head no traumatic findings, atrophy.  09/27/19 MMSE 25/30, failed the clock drawing   Microhematuria 03/24/2015   Neuropathy    Osteoporosis    Parkinson disease ([place])    PVD (peripheral vascular disease) (HCC)    99% stenosis of left anteiror tibial artery, mod stenosis of left distal SFA and popliteal artery followed by [name]   RBBB    noted on EKG 2018   Restless leg syndrome    Sepsis ([place])    hx of due to Ecoli   Septic shock ([place]) 04/19/2020   Shortness of breath    with exertion - chronic    Sjogren's disease ([place])    Sjogren's syndrome ([place]) 02/04/2018   04/16/19 rheumatology: Erosive OA of hands, Sjogrens syndrome, low back pain at multiple sites, recurrent kidney stones, age related osteoporosis w/o current pathological fracture, f/u 6 months.    SOB (shortness of breath)    chronic due to diastolic dysfunction, deconditioning, obesity   Spinal stenosis    lumbar region    Tremor    Unstable gait 04/21/2018   Unsteadiness on feet    Urinary frequency 09/18/2018   Urinary tract infection    Visual hallucinations    Past Surgical History:   Procedure Laterality Date   ABDOMINAL HYSTERECTOMY     BACK SURGERY     4 back surgeries,   lumbar fusion   CARDIAC CATHETERIZATION  2006   normal   CYSTOSCOPY/URETEROSCOPY/HOLMIUM LASER/STENT PLACEMENT Left 01/06/2019   Procedure: CYSTOSCOPY/RETROGRADE/URETEROSCOPY/HOLMIUM LASER/BASKET RETRIEVAL/STENT PLACEMENT;  Surgeon: Ceasar Mons, MD;  Location: [facility];  Service: Urology;  Laterality: Left;   CYSTOSCOPY/URETEROSCOPY/HOLMIUM LASER/STENT PLACEMENT Bilateral 05/24/2020   Procedure: CYSTOSCOPY/RETROGRADE/URETEROSCOPY/HOLMIUM LASER/STENT PLACEMENT;  Surgeon: Ceasar Mons, MD;  Location: [facility];  Service: Urology;  Laterality: Bilateral;   EYE SURGERY Bilateral    cateracts   falls     variious fall, broken wrist,and toes   FRACTURE SURGERY Right April 2016   Wrist, Pt. fell   IR NEPHROSTOMY PLACEMENT RIGHT  04/19/2020   kidney stone removal Left 01/20/2019   RIGHT/LEFT HEART CATH AND CORONARY ANGIOGRAPHY N/A 02/05/2018   Procedure: RIGHT/LEFT HEART CATH AND CORONARY ANGIOGRAPHY;  Surgeon: Troy Sine, MD;  Location: [facility];  Service: Cardiovascular;  Laterality: N/A;   SPINAL CORD STIMULATOR INSERTION N/A 09/09/2015   Procedure: LUMBAR SPINAL CORD STIMULATOR INSERTION;  Surgeon: Clydell Hakim, MD;  Location: [facility] NEURO ORS;  Service: Neurosurgery;  Laterality: N/A;  LUMBAR SPINAL CORD STIMULATOR INSERTION   SPINE SURGERY  April 2013   Back X's 4   TOTAL HIP ARTHROPLASTY     right   WRIST FRACTURE SURGERY Bilateral     Allergies  Allergen Reactions   Adrenalone    Lactose Other (See Comments)    abd pain, lactose intolerant   Morphine Other (See Comments)   Sulfa Antibiotics Other (See Comments)    Headache, very sick   Codeine Other (See Comments)    Reaction:  Headaches and nightmares    Lactose Intolerance (Gi) Nausea And Vomiting   Latex Rash   Lyrica [Pregabalin] Swelling and Other (See Comments)    Reaction:  Leg swelling   Other  Other (See Comments)    Pt states that pain medications give her nightmares.     Plaquenil [Hydroxychloroquine Sulfate] Other (See Comments)    Reaction:  GI upset    Reglan [Metoclopramide] Other (See Comments)    Reaction:  GI upset    Requip [Ropinirole Hcl] Other (See Comments)    Reaction:  GI upset    Septra [Sulfamethoxazole-Trimethoprim] Nausea And Vomiting   Shellfish Allergy Nausea And Vomiting    Allergies as of 09/07/2021       Reactions   Adrenalone    Lactose Other (See Comments)   abd pain, lactose intolerant   Morphine Other (See Comments)   Sulfa Antibiotics Other (See Comments)   Headache, very sick   Codeine Other (See Comments)   Reaction:  Headaches and nightmares    Lactose Intolerance (gi) Nausea And Vomiting   Latex Rash   Lyrica [pregabalin] Swelling, Other (See Comments)   Reaction:  Leg swelling   Other Other (See Comments)   Pt states that pain medications give her nightmares.     Plaquenil [hydroxychloroquine Sulfate] Other (See Comments)   Reaction:  GI upset    Reglan [metoclopramide] Other (See Comments)   Reaction:  GI upset    Requip [ropinirole Hcl] Other (See Comments)   Reaction:  GI upset    Septra [sulfamethoxazole-trimethoprim] Nausea And Vomiting   Shellfish Allergy Nausea And Vomiting        Medication List        Accurate as of September 07, 2021  9:59 AM. If you have any questions, ask your nurse or doctor.          acetaminophen 500 MG tablet Commonly known as: TYLENOL Take 500 mg by mouth 3 (three) times daily as needed for mild pain. not to exceed 3 gm in 24 hours. As needed for arthritis pain   AeroChamber MV inhaler by Other route. Use as instructed as needed   aspirin 81 MG EC tablet Take 81 mg by mouth daily. Swallow whole.   Calcium 600/Vitamin D3 600-800 MG-UNIT Tabs Generic drug: Calcium Carb-Cholecalciferol Take 1 tablet by mouth daily.   carbidopa-levodopa 25-100 MG tablet Commonly known as:  SINEMET IR Take 2 tablets by mouth 2 (two) times daily.   docusate sodium 100 MG capsule Commonly known as: COLACE Take 100 mg by mouth at bedtime.   eucerin cream Apply 1 application topically daily.   famotidine 20 MG tablet Commonly known as: PEPCID Take 20 mg by mouth daily.   furosemide 20 MG tablet Commonly known as: LASIX Take 1 tablet (20 mg total) by mouth daily.   hydroxychloroquine 200 MG tablet Commonly known as: PLAQUENIL Take  200 mg by mouth daily.   levothyroxine 50 MCG tablet Commonly known as: SYNTHROID Take 50 mcg by mouth daily before breakfast.   lubiprostone 24 MCG capsule Commonly known as: AMITIZA Take 24 mcg by mouth 2 (two) times daily with a meal.   MULTIPLE MINERALS-VITAMINS PO Take 1 tablet by mouth daily.   nystatin powder Commonly known as: MYCOSTATIN/NYSTOP Apply topically 2 (two) times daily as needed.   pantoprazole 40 MG tablet Commonly known as: PROTONIX Take 40 mg by mouth daily.   potassium citrate 10 MEQ (1080 MG) SR tablet Commonly known as: UROCIT-K Take 10 mEq by mouth 2 (two) times daily.   pramipexole 1 MG tablet Commonly known as: MIRAPEX Take 1 mg by mouth 2 (two) times daily.   PRESERVISION AREDS 2 PO Take 1 tablet by mouth daily.   QUEtiapine 25 MG tablet Commonly known as: SEROQUEL Take 25 mg by mouth daily.   QUEtiapine 25 MG tablet Commonly known as: SEROQUEL Take 12.5 mg by mouth daily.   rosuvastatin 20 MG tablet Commonly known as: CRESTOR Take 20 mg by mouth daily.   saccharomyces boulardii 250 MG capsule Commonly known as: FLORASTOR Take 250 mg by mouth 2 (two) times daily.   senna-docusate 8.6-50 MG tablet Commonly known as: Senokot-S Take 1 tablet by mouth at bedtime.   sertraline 50 MG tablet Commonly known as: ZOLOFT Take 75 mg by mouth at bedtime. 1-1/2 tablets to = 75 mg   Slow Fe 142 (45 Fe) MG Tbcr Generic drug: Ferrous Sulfate Take 1 tablet by mouth. Daily   Systane 0.4-0.3 %  Soln Generic drug: Polyethyl Glycol-Propyl Glycol Apply 1 drop to eye 2 (two) times daily as needed (dry eyes).   traMADol 50 MG tablet Commonly known as: ULTRAM Take 1 tablet (50 mg total) by mouth 3 (three) times daily.   Vitamin D 50 MCG (2000 UT) tablet Take 2,000 Units by mouth daily.        Review of Systems  Constitutional:  Negative for activity change, appetite change, chills, fatigue and fever.  HENT:  Negative for dental problem, hearing loss and trouble swallowing.   Eyes:  Negative for visual disturbance.       Glasses  Respiratory:  Positive for shortness of breath. Negative for cough and wheezing.   Cardiovascular:  Positive for leg swelling. Negative for chest pain.  Gastrointestinal:  Positive for constipation. Negative for abdominal distention, abdominal pain, blood in stool, diarrhea, nausea and vomiting.  Genitourinary:  Positive for dysuria and frequency. Negative for hematuria.  Musculoskeletal:  Positive for arthralgias, back pain, gait problem and myalgias.  Skin: Negative.  Negative for wound.  Neurological:  Positive for tremors and weakness. Negative for dizziness and headaches.  Psychiatric/Behavioral:  Positive for confusion, dysphoric mood and hallucinations. Negative for sleep disturbance. The patient is not nervous/anxious.    Immunization History  Administered Date(s) Administered   Hepatitis A, Adult 03/01/1997, 09/20/1997   Influenza Split 09/09/2013, 09/27/2014   Influenza, High Dose Seasonal PF 08/01/2011, 08/27/2012, 10/14/2013, 09/08/2015, 09/10/2016, 09/12/2017, 09/12/2018, 09/15/2019   Influenza,inj,Quad PF,6+ Mos 09/11/2016   Influenza-Unspecified 09/21/2020   Moderna Sars-Covid-2 Vaccination 12/12/2019, 01/09/2020, 10/18/2020, 05/09/2021, 08/29/2021   Pneumococcal Conjugate-13 04/15/2014   Pneumococcal Polysaccharide-23 09/28/2005   Td 06/20/2016   Tdap 04/29/2006, 09/01/2015   Zoster Recombinat (Shingrix) 06/11/2018, 09/10/2018    Zoster, Live 04/29/2006   Pertinent  Health Maintenance Due  Topic Date Due   INFLUENZA VACCINE  07/10/2021   DEXA SCAN  Completed  Fall Risk  08/09/2021 08/04/2021 08/03/2021 07/13/2021 11/16/2019  Falls in the past year? 1 1 1  0 0  Comment - - - - -  Number falls in past yr: 0 1 0 0 -  Injury with Fall? 1 1 1  0 -  Comment - - - - -  Risk for fall due to : History of fall(s) - - - -  Risk for fall due to: Comment - - - - -  Follow up Falls evaluation completed;Education provided - - - -   Functional Status Survey:    Vitals:   09/07/21 0933  BP: 116/60  Pulse: 76  Resp: 16  Temp: (!) 96.5 F (35.8 C)  SpO2: 95%  Weight: 127 lb 12.8 oz (58 kg)  Height: 4\' 10"  (1.473 m)   Body mass index is 26.71 kg/m. Physical Exam Vitals reviewed.  Constitutional:      General: She is not in acute distress. HENT:     Head: Normocephalic.     Right Ear: There is no impacted cerumen.     Left Ear: There is no impacted cerumen.     Nose: Nose normal.     Mouth/Throat:     Mouth: Mucous membranes are moist.  Eyes:     General:        Right eye: No discharge.        Left eye: No discharge.  Neck:     Vascular: No carotid bruit.  Cardiovascular:     Rate and Rhythm: Normal rate and regular rhythm.     Pulses: Normal pulses.     Heart sounds: Murmur heard.  Pulmonary:     Effort: Pulmonary effort is normal. No respiratory distress.     Breath sounds: Normal breath sounds. No wheezing.  Abdominal:     General: Bowel sounds are normal. There is no distension.     Tenderness: There is no abdominal tenderness.  Musculoskeletal:     Cervical back: Normal and normal range of motion.     Thoracic back: Normal.     Lumbar back: Tenderness present. No swelling, deformity or lacerations. Normal range of motion.     Right hip: No deformity, lacerations, tenderness or crepitus. Decreased range of motion.     Right lower leg: Edema present.     Left lower leg: Edema present.     Comments:  Non-pitting  Lymphadenopathy:     Cervical: No cervical adenopathy.  Skin:    General: Skin is warm and dry.     Capillary Refill: Capillary refill takes less than 2 seconds.     Comments: Small bruising, bluish/red, no skin breakdown to mid back, tender to touch.   Neurological:     General: No focal deficit present.     Mental Status: She is alert. Mental status is at baseline.     Motor: Weakness present.     Gait: Gait abnormal.     Comments: Walker/wheelchair  Psychiatric:        Mood and Affect: Mood normal.        Behavior: Behavior normal.    Labs reviewed: Recent Labs    10/05/20 0000 11/22/20 1109 04/12/21 0005  NA 142 141 140  K 4.2 4.2 4.2  CL 107 104 104  CO2 25* 22 25*  GLUCOSE  --  104*  --   BUN 17 17 18   CREATININE 0.9 1.01* 0.9  CALCIUM 9.6 10.0 9.8   Recent Labs    10/05/20 0000 04/12/21  0005  AST 19 19  ALT 15 8  ALKPHOS 62 78  ALBUMIN 3.5 4.1   Recent Labs    10/05/20 0000 04/12/21 0005  WBC 4.8 6.7  NEUTROABS 2,328.00 4,147.00  HGB 9.6* 11.0*  HCT 30* 34*  PLT 288 282   Lab Results  Component Value Date   TSH 1.62 04/12/2021   No results found for: HGBA1C Lab Results  Component Value Date   CHOL 105 10/05/2020   HDL 46 10/05/2020   LDLCALC 38 10/05/2020   TRIG 119 10/05/2020   CHOLHDL 2.4 07/14/2019    Significant Diagnostic Results in last 30 days:  No results found.  Assessment/Plan 1. Fall, initial encounter - 09/27 found on bathroom floor- c/o increased back and right hip pain - xray right hip/pelvis - xray spine - cont scheduled tylenol and tramadol  2. Right hip pain - see above  3. Chronic low back pain with sciatica, sciatica laterality unspecified, unspecified back pain laterality - see above  4. Chronic diastolic CHF (congestive heart failure) (HCC) - LV EF 60-65% 2021 - no recent weight fluctuations, reports some sob with exertion and non-pitting edema - cont furosemide  5. Renal lithiasis -  long  history of recurrent UTI, urosepsis - intermittent dysuria  - recently seen by ID- given Cipro x 7 days- f/u prn - scheduled to see urology 09/30  6. Gastroesophageal reflux disease, unspecified whether esophagitis present - hgb 11 04/2021 - cont Pepcid and Protonix  7. Parkinson's disease ([place]) - followed by Dr. Carles Collet - plan to wean off pramipexole in next month to decrease hallucinations - cont sinemet  8. Depression, psychotic ([place]) - stable with Zoloft and Seroquel  9. Hallucinations, visual - see above - cont Seroquel  10. Chronic constipation - LBM today - cont colace, senna, and amitiza  11. Acquired hypothyroidism - TSH 1.62 04/2021 - cont levothyroxine  12. Iron deficiency anemia secondary to inadequate dietary iron intake - cont ferrous sulfate    Family/ staff Communication: plan discussed with patient, husband and nurse  Labs/tests ordered:  xray hip/pelvis/ spine

## 2021-09-08 DIAGNOSIS — K449 Diaphragmatic hernia without obstruction or gangrene: Secondary | ICD-10-CM | POA: Diagnosis not present

## 2021-09-08 DIAGNOSIS — N132 Hydronephrosis with renal and ureteral calculous obstruction: Secondary | ICD-10-CM | POA: Diagnosis not present

## 2021-09-08 DIAGNOSIS — M5134 Other intervertebral disc degeneration, thoracic region: Secondary | ICD-10-CM | POA: Diagnosis not present

## 2021-09-08 DIAGNOSIS — N200 Calculus of kidney: Secondary | ICD-10-CM | POA: Diagnosis not present

## 2021-09-08 DIAGNOSIS — N130 Hydronephrosis with ureteropelvic junction obstruction: Secondary | ICD-10-CM | POA: Diagnosis not present

## 2021-09-08 DIAGNOSIS — Z87448 Personal history of other diseases of urinary system: Secondary | ICD-10-CM | POA: Diagnosis not present

## 2021-09-08 DIAGNOSIS — N3020 Other chronic cystitis without hematuria: Secondary | ICD-10-CM | POA: Diagnosis not present

## 2021-09-11 DIAGNOSIS — M25522 Pain in left elbow: Secondary | ICD-10-CM | POA: Diagnosis not present

## 2021-09-11 DIAGNOSIS — G20 Parkinson's disease: Secondary | ICD-10-CM | POA: Diagnosis not present

## 2021-09-11 DIAGNOSIS — N3941 Urge incontinence: Secondary | ICD-10-CM | POA: Diagnosis not present

## 2021-09-11 DIAGNOSIS — R29898 Other symptoms and signs involving the musculoskeletal system: Secondary | ICD-10-CM | POA: Diagnosis not present

## 2021-09-11 DIAGNOSIS — R296 Repeated falls: Secondary | ICD-10-CM | POA: Diagnosis not present

## 2021-09-11 DIAGNOSIS — M6281 Muscle weakness (generalized): Secondary | ICD-10-CM | POA: Diagnosis not present

## 2021-09-11 DIAGNOSIS — R278 Other lack of coordination: Secondary | ICD-10-CM | POA: Diagnosis not present

## 2021-09-11 DIAGNOSIS — R2681 Unsteadiness on feet: Secondary | ICD-10-CM | POA: Diagnosis not present

## 2021-09-13 DIAGNOSIS — R2681 Unsteadiness on feet: Secondary | ICD-10-CM | POA: Diagnosis not present

## 2021-09-13 DIAGNOSIS — R29898 Other symptoms and signs involving the musculoskeletal system: Secondary | ICD-10-CM | POA: Diagnosis not present

## 2021-09-13 DIAGNOSIS — M25522 Pain in left elbow: Secondary | ICD-10-CM | POA: Diagnosis not present

## 2021-09-13 DIAGNOSIS — N3941 Urge incontinence: Secondary | ICD-10-CM | POA: Diagnosis not present

## 2021-09-13 DIAGNOSIS — R278 Other lack of coordination: Secondary | ICD-10-CM | POA: Diagnosis not present

## 2021-09-13 DIAGNOSIS — M6281 Muscle weakness (generalized): Secondary | ICD-10-CM | POA: Diagnosis not present

## 2021-09-14 DIAGNOSIS — M6281 Muscle weakness (generalized): Secondary | ICD-10-CM | POA: Diagnosis not present

## 2021-09-14 DIAGNOSIS — R278 Other lack of coordination: Secondary | ICD-10-CM | POA: Diagnosis not present

## 2021-09-14 DIAGNOSIS — M25522 Pain in left elbow: Secondary | ICD-10-CM | POA: Diagnosis not present

## 2021-09-14 DIAGNOSIS — N3941 Urge incontinence: Secondary | ICD-10-CM | POA: Diagnosis not present

## 2021-09-14 DIAGNOSIS — R29898 Other symptoms and signs involving the musculoskeletal system: Secondary | ICD-10-CM | POA: Diagnosis not present

## 2021-09-14 DIAGNOSIS — R2681 Unsteadiness on feet: Secondary | ICD-10-CM | POA: Diagnosis not present

## 2021-09-15 DIAGNOSIS — R2681 Unsteadiness on feet: Secondary | ICD-10-CM | POA: Diagnosis not present

## 2021-09-15 DIAGNOSIS — N3941 Urge incontinence: Secondary | ICD-10-CM | POA: Diagnosis not present

## 2021-09-15 DIAGNOSIS — M6281 Muscle weakness (generalized): Secondary | ICD-10-CM | POA: Diagnosis not present

## 2021-09-15 DIAGNOSIS — M25522 Pain in left elbow: Secondary | ICD-10-CM | POA: Diagnosis not present

## 2021-09-15 DIAGNOSIS — R29898 Other symptoms and signs involving the musculoskeletal system: Secondary | ICD-10-CM | POA: Diagnosis not present

## 2021-09-15 DIAGNOSIS — R278 Other lack of coordination: Secondary | ICD-10-CM | POA: Diagnosis not present

## 2021-09-16 DIAGNOSIS — M25522 Pain in left elbow: Secondary | ICD-10-CM | POA: Diagnosis not present

## 2021-09-16 DIAGNOSIS — R29898 Other symptoms and signs involving the musculoskeletal system: Secondary | ICD-10-CM | POA: Diagnosis not present

## 2021-09-16 DIAGNOSIS — N3941 Urge incontinence: Secondary | ICD-10-CM | POA: Diagnosis not present

## 2021-09-16 DIAGNOSIS — R278 Other lack of coordination: Secondary | ICD-10-CM | POA: Diagnosis not present

## 2021-09-16 DIAGNOSIS — R2681 Unsteadiness on feet: Secondary | ICD-10-CM | POA: Diagnosis not present

## 2021-09-16 DIAGNOSIS — M6281 Muscle weakness (generalized): Secondary | ICD-10-CM | POA: Diagnosis not present

## 2021-09-19 ENCOUNTER — Other Ambulatory Visit: Payer: Self-pay | Admitting: Urology

## 2021-09-19 DIAGNOSIS — N3941 Urge incontinence: Secondary | ICD-10-CM | POA: Diagnosis not present

## 2021-09-19 DIAGNOSIS — M25522 Pain in left elbow: Secondary | ICD-10-CM | POA: Diagnosis not present

## 2021-09-19 DIAGNOSIS — M6281 Muscle weakness (generalized): Secondary | ICD-10-CM | POA: Diagnosis not present

## 2021-09-19 DIAGNOSIS — R2681 Unsteadiness on feet: Secondary | ICD-10-CM | POA: Diagnosis not present

## 2021-09-19 DIAGNOSIS — R29898 Other symptoms and signs involving the musculoskeletal system: Secondary | ICD-10-CM | POA: Diagnosis not present

## 2021-09-19 DIAGNOSIS — R278 Other lack of coordination: Secondary | ICD-10-CM | POA: Diagnosis not present

## 2021-09-20 NOTE — Progress Notes (Signed)
COVID swab appointment:  N/A  COVID Vaccine Completed: Yes x5 Date COVID Vaccine completed:  12-12-19 01-09-20 Has received booster:  Yes x3 10-18-20 05-09-21 08-29-21 COVID vaccine manufacturer:     Moderna    Date of COVID positive in last 90 days:  PCP - [name], MD Cardiologist - Fransico Him, MD  Chest x-ray -  EKG - 05-15-21 Epic Stress Test - 2019 Epic ECHO - 11-15-20 Epic Cardiac Cath - 2019 Epic Pacemaker/ICD device last checked: Spinal Cord Stimulator:  Sleep Study -  CPAP -   Fasting Blood Sugar -  Checks Blood Sugar _____ times a day  Blood Thinner Instructions: Aspirin Instructions: Last Dose:  Activity level:  Can go up a flight of stairs and perform activities of daily living without stopping and without symptoms of chest pain or shortness of breath.   Able to exercise without symptoms  Unable to go up a flight of stairs without symptoms of      Anesthesia review:  Aortic stenosis, RBBB, PVD, CHF, heart murmur.  Parkinson's disease  Patient denies shortness of breath, fever, cough and chest pain at PAT appointment   Patient verbalized understanding of instructions that were given to them at the PAT appointment. Patient was also instructed that they will need to review over the PAT instructions again at home before surgery.

## 2021-09-21 ENCOUNTER — Other Ambulatory Visit: Payer: Self-pay

## 2021-09-21 ENCOUNTER — Ambulatory Visit (INDEPENDENT_AMBULATORY_CARE_PROVIDER_SITE_OTHER): Payer: Medicare Other | Admitting: Podiatry

## 2021-09-21 DIAGNOSIS — M25522 Pain in left elbow: Secondary | ICD-10-CM | POA: Diagnosis not present

## 2021-09-21 DIAGNOSIS — R2681 Unsteadiness on feet: Secondary | ICD-10-CM | POA: Diagnosis not present

## 2021-09-21 DIAGNOSIS — M79675 Pain in left toe(s): Secondary | ICD-10-CM

## 2021-09-21 DIAGNOSIS — G629 Polyneuropathy, unspecified: Secondary | ICD-10-CM

## 2021-09-21 DIAGNOSIS — F323 Major depressive disorder, single episode, severe with psychotic features: Secondary | ICD-10-CM | POA: Diagnosis not present

## 2021-09-21 DIAGNOSIS — M6281 Muscle weakness (generalized): Secondary | ICD-10-CM | POA: Diagnosis not present

## 2021-09-21 DIAGNOSIS — B351 Tinea unguium: Secondary | ICD-10-CM

## 2021-09-21 DIAGNOSIS — R29898 Other symptoms and signs involving the musculoskeletal system: Secondary | ICD-10-CM | POA: Diagnosis not present

## 2021-09-21 DIAGNOSIS — F419 Anxiety disorder, unspecified: Secondary | ICD-10-CM | POA: Diagnosis not present

## 2021-09-21 DIAGNOSIS — M79674 Pain in right toe(s): Secondary | ICD-10-CM

## 2021-09-21 DIAGNOSIS — R278 Other lack of coordination: Secondary | ICD-10-CM | POA: Diagnosis not present

## 2021-09-21 DIAGNOSIS — N3941 Urge incontinence: Secondary | ICD-10-CM | POA: Diagnosis not present

## 2021-09-21 DIAGNOSIS — R441 Visual hallucinations: Secondary | ICD-10-CM | POA: Diagnosis not present

## 2021-09-22 DIAGNOSIS — N3941 Urge incontinence: Secondary | ICD-10-CM | POA: Diagnosis not present

## 2021-09-22 DIAGNOSIS — R278 Other lack of coordination: Secondary | ICD-10-CM | POA: Diagnosis not present

## 2021-09-22 DIAGNOSIS — R2681 Unsteadiness on feet: Secondary | ICD-10-CM | POA: Diagnosis not present

## 2021-09-22 DIAGNOSIS — R29898 Other symptoms and signs involving the musculoskeletal system: Secondary | ICD-10-CM | POA: Diagnosis not present

## 2021-09-22 DIAGNOSIS — M25522 Pain in left elbow: Secondary | ICD-10-CM | POA: Diagnosis not present

## 2021-09-22 DIAGNOSIS — M6281 Muscle weakness (generalized): Secondary | ICD-10-CM | POA: Diagnosis not present

## 2021-09-25 ENCOUNTER — Encounter (HOSPITAL_COMMUNITY): Payer: Self-pay | Admitting: Urology

## 2021-09-25 DIAGNOSIS — E663 Overweight: Secondary | ICD-10-CM | POA: Diagnosis not present

## 2021-09-25 DIAGNOSIS — M797 Fibromyalgia: Secondary | ICD-10-CM | POA: Diagnosis not present

## 2021-09-25 DIAGNOSIS — M810 Age-related osteoporosis without current pathological fracture: Secondary | ICD-10-CM | POA: Diagnosis not present

## 2021-09-25 DIAGNOSIS — M154 Erosive (osteo)arthritis: Secondary | ICD-10-CM | POA: Diagnosis not present

## 2021-09-25 DIAGNOSIS — Z6826 Body mass index (BMI) 26.0-26.9, adult: Secondary | ICD-10-CM | POA: Diagnosis not present

## 2021-09-25 DIAGNOSIS — M3500 Sicca syndrome, unspecified: Secondary | ICD-10-CM | POA: Diagnosis not present

## 2021-09-25 DIAGNOSIS — M2550 Pain in unspecified joint: Secondary | ICD-10-CM | POA: Diagnosis not present

## 2021-09-25 NOTE — Progress Notes (Signed)
Subjective: 87 y.o. returns the office today for painful, elongated, thickened toenails which she cannot trim herself.  Denies any open lesions.  No recent injury to her feet she reports.  PCP: [name], MD   Objective: AAO 3, NAD DP/PT pulses palpable, CRT less than 3 seconds Mild chronic bilateral lower extremity edema present. Nails hypertrophic, dystrophic, elongated, brittle, discolored 10. There is tenderness overlying the nails 1-5 bilaterally except for the left hallux toenail does not present. There is no surrounding erythema or drainage along the nail sites. No open lesions or pre-ulcerative lesions are identified. No significant areas of tenderness today otherwise. No pain with calf compression, warmth, erythema.  Assessment: Patient presents with symptomatic onychomycosis  Plan: -Treatment options including alternatives, risks, complications were discussed -Nails sharply debrided 9 without complication/bleeding. -Continue home stretching, rehab exercises for plantar fasciitis to help with reoccurrence.  Trula Slade DPM

## 2021-09-25 NOTE — Progress Notes (Signed)
COVID swab appointment:  N/A   COVID Vaccine Completed: Yes x5 Date COVID Vaccine completed:  12-12-19 01-09-20 Has received booster:  Yes x3 10-18-20 05-09-21 08-29-21 COVID vaccine manufacturer:     Moderna     Date of COVID positive in last 90 days: No   PCP - [name], MD Cardiologist - Fransico Him, MD   Chest x-ray - 04/19/20 in epic EKG - 05-15-21 Epic Stress Test - 2019 Epic ECHO - 11-15-20 Epic Cardiac Cath - 2019 Epic Pacemaker/ICD device last checked: N/A Spinal Cord Stimulator: Yes 09/09/2015   Sleep Study - N/A CPAP -    Fasting Blood Sugar - N/A Checks Blood Sugar __N/A___ times a day   Blood Thinner Instructions: N/A Aspirin Instructions: Yes Last Dose: Continue per special needs note in epic                           Anesthesia review:  Aortic stenosis, RBBB, PVD, CHF, heart murmur.  Parkinson's disease   Patient denies shortness of breath, fever, cough and chest pain at PAT appointment     Patient verbalized understanding of instructions that were given to them at the PAT appointment. Patient was also instructed that they will need to review over the PAT instructions again at home before surgery.

## 2021-09-25 NOTE — Patient Instructions (Addendum)
Preop instructions for:  [name] Date of Birth:   03-Jan-1931                    Date of Procedure:  09/27/21  Procedure:    CYSTOSCOPY/RETROGRADE/URETEROSCOPY/HOLMIUM LASER/STENT PLACEMENT  Surgeon: Dr. Ellison Hughs Facility contact:  [place]   Phone:   [place] POA:  [name]  RN contact name/phone#:  Ermalinda Barrios , LPN                    and Fax #: [phone]   Transportation contact phone#: [place] [phone] accompanied by [name] Husband [phone]   Time to arrive at [facility]: 6:15 AM   Report to: Admitting (On your left hand side)    Do not eat or drink past midnight the night before your procedure.(To include any tube feedings-must be discontinued)  Bring remote control for Spinal Cord Stimulator!  Remain on aspirin  Take these morning medications only with sips of water.(or give through gastrostomy or feeding tube). Sinemet, Famotidine, Levothyroxine, Amitza, Pantoprazole, Pramipexole, Seroquel, Crestor, Florastor Tramadol, Tylenol    Please send day of procedure:current med list and meds last taken that day, confirm nothing by mouth status from what time, Patient Demographic info( to include DNR status, problem list, allergies)   Bring Insurance card and picture ID Leave all jewelry and other valuables at place where living( no metal or rings to be worn) No contact lens Women-no make-up, no lotions,perfumes,powders    Any questions day of procedure,call  [facility]-[phone]     Sent from :[facility]                   Phone:[phone]                   Fax:[phone]   Sent by :  Harlon Flor BSN             RN

## 2021-09-26 DIAGNOSIS — R2681 Unsteadiness on feet: Secondary | ICD-10-CM | POA: Diagnosis not present

## 2021-09-26 DIAGNOSIS — M25522 Pain in left elbow: Secondary | ICD-10-CM | POA: Diagnosis not present

## 2021-09-26 DIAGNOSIS — M6281 Muscle weakness (generalized): Secondary | ICD-10-CM | POA: Diagnosis not present

## 2021-09-26 DIAGNOSIS — R29898 Other symptoms and signs involving the musculoskeletal system: Secondary | ICD-10-CM | POA: Diagnosis not present

## 2021-09-26 DIAGNOSIS — R278 Other lack of coordination: Secondary | ICD-10-CM | POA: Diagnosis not present

## 2021-09-26 DIAGNOSIS — N3941 Urge incontinence: Secondary | ICD-10-CM | POA: Diagnosis not present

## 2021-09-26 NOTE — Progress Notes (Signed)
Anesthesia Chart Review   Case: [id number] Date/Time: 09/27/21 0915   Procedure: CYSTOSCOPY/RETROGRADE/URETEROSCOPY/HOLMIUM LASER/STENT PLACEMENT (Right) - ONLY NEEDS 60 MIN   Anesthesia type: General   Pre-op diagnosis: RIGHT URETERAL STONE   Location: [facility]   Surgeons: Ceasar Mons, MD       DISCUSSION:87 y.o. never smoker with h/o hypothyroidism, GERD, Sjogren's, chronic shortness of breath due to diastolic dysfunction, HTN, parkinson's, CKD Stage III, severe aortic stenosis (peak gradient 68.6 mmHg, valve area 0.79 cm2 on Echo 11/15/2020), right ureteral stone scheduled for above procedure 09/27/2021 with Dr. Harrell Gave [name].   Pt resides in nursing home, not seen in clinic, evaluate DOS.  Last seen by PCP in nursing home 09/07/2021. No changes made at this visit.   Pt last seen by cardiology 05/15/2021. Per OV note pt evaluated by structural heart team and considered not a candidate for TAVR. Overall, stable at 05/15/21 visit.   VS: There were no vitals taken for this visit.  PROVIDERS: [name], MD  [name], MD as PCP - Cardiology (Cardiology)  LABS:  labs DOS, same day workup (all labs ordered are listed, but only abnormal results are displayed)  Labs Reviewed - No data to display   IMAGES:   EKG: 05/15/2021 Rate 80 bpm  NSR RBBB  CV: Echo 11/15/2020  1. Left ventricular ejection fraction, by estimation, is 60 to 65%. The  left ventricle has normal function. The left ventricle has no regional  wall motion abnormalities. Left ventricular diastolic parameters are  consistent with Grade I diastolic  dysfunction (impaired relaxation).   2. Right ventricular systolic function is normal. The right ventricular  size is normal.   3. Left atrial size was mildly dilated.   4. The mitral valve is normal in structure. No evidence of mitral valve  regurgitation. No evidence of mitral stenosis.   5. The aortic valve is normal in  structure. There is severe calcifcation  of the aortic valve. There is severe thickening of the aortic valve.  Aortic valve regurgitation is not visualized. Severe aortic valve  stenosis. Aortic valve mean gradient measures   42.0 mmHg.   6. The inferior vena cava is normal in size with greater than 50%  respiratory variability, suggesting right atrial pressure of 3 mmHg.   Cardiac Cath 02/05/2018 Prox LAD lesion is 35% stenosed.   Mild nonobstructive CAD with 30-40% proximal LAD stenosis before the first diagonal vessel; normal left circumflex and normal RCA.   Upper normal right heart pressures.   Mild-to-moderate aortic stenosis with a mean gradient of 14 mm and aortic valve area of 1.4 centimeters squared.   RECOMMENDATION:  Medical therapy.  The patient will follow-up with Dr. Golden Hurter.   Past Medical History:  Diagnosis Date   Abnormal nuclear stress test    Actinic cheilitis 04/23/2019   Anemia    iron deficiency    Anxiety    Aortic stenosis    severe by echo 2021    Arthritis    Broken arm    right    Chronic diastolic CHF (congestive heart failure) ([place])    Chronic pain    Cognitive communication deficit    Cognitive deficits    Communication   Constipation    Depression    Erosive (osteo)arthritis 03/09/2020   Family history of adverse reaction to anesthesia    pt. states sister vomits   Fibromyalgia    GERD (gastroesophageal reflux disease)  H/O hiatal hernia    Hallucinations, visual 07/21/2018   03/10/20 psych consult.    Hematuria 05/18/2020   History of bronchitis    History of kidney stones    Hypercholesterolemia    Hypertension    dr t turner   Hypothyroidism    Memory loss 04/21/2018   08/08/18 CT head no traumatic findings, atrophy.  09/27/19 MMSE 25/30, failed the clock drawing   Microhematuria 03/24/2015   Neuropathy    Osteoporosis    Parkinson disease (HCC)    PVD (peripheral vascular disease) (HCC)    99% stenosis of left  anteiror tibial artery, mod stenosis of left distal SFA and popliteal artery followed by [name]   RBBB    noted on EKG 2018   Restless leg syndrome    Sepsis ([place])    hx of due to Ecoli   Septic shock ([place]) 04/19/2020   Shortness of breath    with exertion - chronic    Sjogren's disease ([place])    Sjogren's syndrome ([place]) 02/04/2018   04/16/19 rheumatology: Erosive OA of hands, Sjogrens syndrome, low back pain at multiple sites, recurrent kidney stones, age related osteoporosis w/o current pathological fracture, f/u 6 months.    SOB (shortness of breath)    chronic due to diastolic dysfunction, deconditioning, obesity   Spinal stenosis    lumbar region    Tremor    Unstable gait 04/21/2018   Unsteadiness on feet    Urinary frequency 09/18/2018   Urinary tract infection    Visual hallucinations     Past Surgical History:  Procedure Laterality Date   ABDOMINAL HYSTERECTOMY     BACK SURGERY     4 back surgeries,   lumbar fusion   CARDIAC CATHETERIZATION  2006   normal   CYSTOSCOPY/URETEROSCOPY/HOLMIUM LASER/STENT PLACEMENT Left 01/06/2019   Procedure: CYSTOSCOPY/RETROGRADE/URETEROSCOPY/HOLMIUM LASER/BASKET RETRIEVAL/STENT PLACEMENT;  Surgeon: Ceasar Mons, MD;  Location: [facility];  Service: Urology;  Laterality: Left;   CYSTOSCOPY/URETEROSCOPY/HOLMIUM LASER/STENT PLACEMENT Bilateral 05/24/2020   Procedure: CYSTOSCOPY/RETROGRADE/URETEROSCOPY/HOLMIUM LASER/STENT PLACEMENT;  Surgeon: Ceasar Mons, MD;  Location: [facility];  Service: Urology;  Laterality: Bilateral;   EYE SURGERY Bilateral    cateracts   falls     variious fall, broken wrist,and toes   FRACTURE SURGERY Right April 2016   Wrist, Pt. fell   IR NEPHROSTOMY PLACEMENT RIGHT  04/19/2020   kidney stone removal Left 01/20/2019   RIGHT/LEFT HEART CATH AND CORONARY ANGIOGRAPHY N/A 02/05/2018   Procedure: RIGHT/LEFT HEART CATH AND CORONARY ANGIOGRAPHY;  Surgeon: Troy Sine, MD;  Location: [facility];  Service: Cardiovascular;  Laterality: N/A;   SPINAL CORD STIMULATOR INSERTION N/A 09/09/2015   Procedure: LUMBAR SPINAL CORD STIMULATOR INSERTION;  Surgeon: Clydell Hakim, MD;  Location: [facility];  Service: Neurosurgery;  Laterality: N/A;  LUMBAR SPINAL CORD STIMULATOR INSERTION   SPINE SURGERY  April 2013   Back X's 4   TOTAL HIP ARTHROPLASTY     right   WRIST FRACTURE SURGERY Bilateral     MEDICATIONS: No current facility-administered medications for this encounter.    acetaminophen (TYLENOL) 500 MG tablet   Calcium Carb-Cholecalciferol (CALCIUM 600/VITAMIN D3) 600-800 MG-UNIT TABS   carbidopa-levodopa (SINEMET IR) 25-100 MG tablet   Cholecalciferol (VITAMIN D) 50 MCG (2000 UT) tablet   docusate sodium (COLACE) 100 MG capsule   famotidine (PEPCID) 20 MG tablet   Ferrous Sulfate (SLOW FE) 142 (45 Fe) MG TBCR   furosemide (LASIX) 20 MG tablet  hydroxychloroquine (PLAQUENIL) 200 MG tablet   levothyroxine (SYNTHROID, LEVOTHROID) 50 MCG tablet   lubiprostone (AMITIZA) 24 MCG capsule   Multiple Vitamin (MULTIVITAMIN WITH MINERALS) TABS tablet   Multiple Vitamins-Minerals (PRESERVISION AREDS 2 PO)   nystatin (MYCOSTATIN/NYSTOP) powder   pantoprazole (PROTONIX) 40 MG tablet   Polyethyl Glycol-Propyl Glycol (SYSTANE) 0.4-0.3 % SOLN   potassium citrate (UROCIT-K) 10 MEQ (1080 MG) SR tablet   pramipexole (MIRAPEX) 0.5 MG tablet   QUEtiapine (SEROQUEL) 25 MG tablet   rosuvastatin (CRESTOR) 20 MG tablet   saccharomyces boulardii (FLORASTOR) 250 MG capsule   senna-docusate (SENOKOT-S) 8.6-50 MG tablet   sertraline (ZOLOFT) 50 MG tablet   Skin Protectants, Misc. (EUCERIN) cream   Spacer/Aero-Holding Chambers (AEROCHAMBER MV) inhaler   traMADol (ULTRAM) 50 MG tablet     [facility], PA-C [facility] Pre-Surgical Testing [phone]

## 2021-09-26 NOTE — Anesthesia Preprocedure Evaluation (Addendum)
Anesthesia Evaluation  Patient identified by MRN, date of birth, ID band Patient awake    Airway Mallampati: II  TM Distance: >3 FB     Dental   Pulmonary    breath sounds clear to auscultation       Cardiovascular hypertension, + Peripheral Vascular Disease, +CHF and + DOE  + dysrhythmias + Valvular Problems/Murmurs  Rhythm:Regular Rate:Normal     Neuro/Psych    GI/Hepatic hiatal hernia, GERD  ,  Endo/Other  Hypothyroidism   Renal/GU Renal disease     Musculoskeletal   Abdominal   Peds  Hematology   Anesthesia Other Findings   Reproductive/Obstetrics                           Anesthesia Physical Anesthesia Plan  ASA: 3  Anesthesia Plan: General   Post-op Pain Management:    Induction: Intravenous  PONV Risk Score and Plan: 3 and Treatment may vary due to age or medical condition, Ondansetron and Dexamethasone  Airway Management Planned: LMA  Additional Equipment:   Intra-op Plan:   Post-operative Plan: Extubation in OR  Informed Consent:     Dental advisory given  Plan Discussed with: Anesthesiologist and CRNA  Anesthesia Plan Comments: (See PAT note 09/26/2021, Konrad Felix Ward, PA-C)     Anesthesia Quick Evaluation

## 2021-09-27 ENCOUNTER — Ambulatory Visit (HOSPITAL_COMMUNITY): Payer: Medicare Other

## 2021-09-27 ENCOUNTER — Ambulatory Visit (HOSPITAL_COMMUNITY): Payer: Medicare Other | Admitting: Physician Assistant

## 2021-09-27 ENCOUNTER — Ambulatory Visit (HOSPITAL_COMMUNITY)
Admission: RE | Admit: 2021-09-27 | Discharge: 2021-09-27 | Disposition: A | Discharge status: Home / Self Care | Payer: Medicare Other | Attending: Urology | Admitting: Urology

## 2021-09-27 ENCOUNTER — Encounter (HOSPITAL_COMMUNITY)
Admission: RE | Disposition: A | Discharge status: Home / Self Care | Payer: Self-pay | Source: Home / Self Care | Attending: Urology

## 2021-09-27 ENCOUNTER — Encounter (HOSPITAL_COMMUNITY): Payer: Self-pay | Admitting: Urology

## 2021-09-27 DIAGNOSIS — M797 Fibromyalgia: Secondary | ICD-10-CM | POA: Diagnosis not present

## 2021-09-27 DIAGNOSIS — I509 Heart failure, unspecified: Secondary | ICD-10-CM | POA: Insufficient documentation

## 2021-09-27 DIAGNOSIS — Z882 Allergy status to sulfonamides status: Secondary | ICD-10-CM | POA: Insufficient documentation

## 2021-09-27 DIAGNOSIS — E669 Obesity, unspecified: Secondary | ICD-10-CM | POA: Insufficient documentation

## 2021-09-27 DIAGNOSIS — Z87442 Personal history of urinary calculi: Secondary | ICD-10-CM | POA: Diagnosis not present

## 2021-09-27 DIAGNOSIS — N2889 Other specified disorders of kidney and ureter: Secondary | ICD-10-CM | POA: Diagnosis not present

## 2021-09-27 DIAGNOSIS — K219 Gastro-esophageal reflux disease without esophagitis: Secondary | ICD-10-CM | POA: Diagnosis not present

## 2021-09-27 DIAGNOSIS — Z888 Allergy status to other drugs, medicaments and biological substances status: Secondary | ICD-10-CM | POA: Diagnosis not present

## 2021-09-27 DIAGNOSIS — N3020 Other chronic cystitis without hematuria: Secondary | ICD-10-CM | POA: Diagnosis not present

## 2021-09-27 DIAGNOSIS — Z91040 Latex allergy status: Secondary | ICD-10-CM | POA: Insufficient documentation

## 2021-09-27 DIAGNOSIS — Z79899 Other long term (current) drug therapy: Secondary | ICD-10-CM | POA: Insufficient documentation

## 2021-09-27 DIAGNOSIS — Z91013 Allergy to seafood: Secondary | ICD-10-CM | POA: Insufficient documentation

## 2021-09-27 DIAGNOSIS — G20 Parkinson's disease: Secondary | ICD-10-CM | POA: Insufficient documentation

## 2021-09-27 DIAGNOSIS — I350 Nonrheumatic aortic (valve) stenosis: Secondary | ICD-10-CM | POA: Diagnosis not present

## 2021-09-27 DIAGNOSIS — M3500 Sicca syndrome, unspecified: Secondary | ICD-10-CM | POA: Insufficient documentation

## 2021-09-27 DIAGNOSIS — E039 Hypothyroidism, unspecified: Secondary | ICD-10-CM | POA: Insufficient documentation

## 2021-09-27 DIAGNOSIS — Z79890 Hormone replacement therapy (postmenopausal): Secondary | ICD-10-CM | POA: Insufficient documentation

## 2021-09-27 DIAGNOSIS — I4510 Unspecified right bundle-branch block: Secondary | ICD-10-CM | POA: Diagnosis not present

## 2021-09-27 DIAGNOSIS — Z87440 Personal history of urinary (tract) infections: Secondary | ICD-10-CM | POA: Diagnosis not present

## 2021-09-27 DIAGNOSIS — N132 Hydronephrosis with renal and ureteral calculous obstruction: Secondary | ICD-10-CM | POA: Diagnosis not present

## 2021-09-27 DIAGNOSIS — N183 Chronic kidney disease, stage 3 unspecified: Secondary | ICD-10-CM | POA: Insufficient documentation

## 2021-09-27 DIAGNOSIS — I5032 Chronic diastolic (congestive) heart failure: Secondary | ICD-10-CM | POA: Insufficient documentation

## 2021-09-27 DIAGNOSIS — I739 Peripheral vascular disease, unspecified: Secondary | ICD-10-CM | POA: Insufficient documentation

## 2021-09-27 DIAGNOSIS — E7800 Pure hypercholesterolemia, unspecified: Secondary | ICD-10-CM | POA: Insufficient documentation

## 2021-09-27 DIAGNOSIS — Z885 Allergy status to narcotic agent status: Secondary | ICD-10-CM | POA: Insufficient documentation

## 2021-09-27 DIAGNOSIS — Z6826 Body mass index (BMI) 26.0-26.9, adult: Secondary | ICD-10-CM | POA: Insufficient documentation

## 2021-09-27 DIAGNOSIS — Z7951 Long term (current) use of inhaled steroids: Secondary | ICD-10-CM | POA: Insufficient documentation

## 2021-09-27 DIAGNOSIS — I130 Hypertensive heart and chronic kidney disease with heart failure and stage 1 through stage 4 chronic kidney disease, or unspecified chronic kidney disease: Secondary | ICD-10-CM | POA: Diagnosis not present

## 2021-09-27 DIAGNOSIS — N202 Calculus of kidney with calculus of ureter: Secondary | ICD-10-CM | POA: Diagnosis not present

## 2021-09-27 DIAGNOSIS — D509 Iron deficiency anemia, unspecified: Secondary | ICD-10-CM | POA: Diagnosis not present

## 2021-09-27 HISTORY — PX: CYSTOSCOPY/URETEROSCOPY/HOLMIUM LASER/STENT PLACEMENT: SHX6546

## 2021-09-27 HISTORY — DX: Other symptoms and signs involving cognitive functions and awareness: R41.89

## 2021-09-27 LAB — BASIC METABOLIC PANEL
Anion gap: 8 (ref 5–15)
BUN: 14 mg/dL (ref 8–23)
CO2: 24 mmol/L (ref 22–32)
Calcium: 9.4 mg/dL (ref 8.9–10.3)
Chloride: 108 mmol/L (ref 98–111)
Creatinine, Ser: 0.85 mg/dL (ref 0.44–1.00)
GFR, Estimated: >60 mL/min (ref >60)
Glucose, Bld: 90 mg/dL (ref 70–99)
Potassium: 4.3 mmol/L (ref 3.5–5.1)
Sodium: 140 mmol/L (ref 135–145)

## 2021-09-27 LAB — CBC
HCT: 29.4 % — ABNORMAL LOW (ref 36.0–46.0)
Hemoglobin: 8.9 g/dL — ABNORMAL LOW (ref 12.0–15.0)
MCH: 29.8 pg (ref 26.0–34.0)
MCHC: 30.3 g/dL (ref 30.0–36.0)
MCV: 98.3 fL (ref 80.0–100.0)
Platelets: 307 10*3/uL (ref 150–400)
RBC: 2.99 MIL/uL — ABNORMAL LOW (ref 3.87–5.11)
RDW: 16.5 % — ABNORMAL HIGH (ref 11.5–15.5)
WBC: 5 10*3/uL (ref 4.0–10.5)
nRBC: 0 % (ref 0.0–0.2)

## 2021-09-27 SURGERY — CYSTOSCOPY/URETEROSCOPY/HOLMIUM LASER/STENT PLACEMENT
Anesthesia: General | Laterality: Right

## 2021-09-27 MED ORDER — ONDANSETRON HCL 4 MG/2ML IJ SOLN
INTRAMUSCULAR | Status: DC | PRN
  Administered 2021-09-27: 4 mg via INTRAVENOUS

## 2021-09-27 MED ORDER — ORAL CARE MOUTH RINSE
15.0000 mL | Freq: Once | OROMUCOSAL | Status: AC
  Administered 2021-09-27: 15 mL via OROMUCOSAL

## 2021-09-27 MED ORDER — CHLORHEXIDINE GLUCONATE 0.12 % MT SOLN: 15.0000 mL | Freq: Once | OROMUCOSAL | Status: AC

## 2021-09-27 MED ORDER — PHENYLEPHRINE HCL-NACL 20-0.9 MG/250ML-% IV SOLN
INTRAVENOUS | Status: DC | PRN
  Administered 2021-09-27: 35 ug/min via INTRAVENOUS

## 2021-09-27 MED ORDER — PHENYLEPHRINE 40 MCG/ML (10ML) SYRINGE FOR IV PUSH (FOR BLOOD PRESSURE SUPPORT)
PREFILLED_SYRINGE | INTRAVENOUS | Status: AC
  Filled 2021-09-27: qty 10

## 2021-09-27 MED ORDER — LIDOCAINE HCL (PF) 2 % IJ SOLN
INTRAMUSCULAR | Status: AC
  Filled 2021-09-27: qty 5

## 2021-09-27 MED ORDER — CEFAZOLIN SODIUM-DEXTROSE 2-4 GM/100ML-% IV SOLN
2.0000 g | Freq: Once | INTRAVENOUS | Status: AC
  Administered 2021-09-27: 2 g via INTRAVENOUS
  Filled 2021-09-27: qty 100

## 2021-09-27 MED ORDER — ONDANSETRON HCL 4 MG/2ML IJ SOLN
INTRAMUSCULAR | Status: AC
  Filled 2021-09-27: qty 2

## 2021-09-27 MED ORDER — PROPOFOL 10 MG/ML IV BOLUS
INTRAVENOUS | Status: DC | PRN
  Administered 2021-09-27: 150 mg via INTRAVENOUS

## 2021-09-27 MED ORDER — LIDOCAINE 2% (20 MG/ML) 5 ML SYRINGE
INTRAMUSCULAR | Status: DC | PRN
  Administered 2021-09-27: 100 mg via INTRAVENOUS

## 2021-09-27 MED ORDER — FENTANYL CITRATE (PF) 100 MCG/2ML IJ SOLN
INTRAMUSCULAR | Status: AC
  Filled 2021-09-27: qty 2

## 2021-09-27 MED ORDER — LACTATED RINGERS IV SOLN: INTRAVENOUS | Status: DC

## 2021-09-27 MED ORDER — ARTIFICIAL TEARS OPHTHALMIC OINT
TOPICAL_OINTMENT | OPHTHALMIC | Status: AC
  Filled 2021-09-27: qty 3.5

## 2021-09-27 MED ORDER — SODIUM CHLORIDE 0.9 % IR SOLN
Status: DC | PRN
  Administered 2021-09-27: 3000 mL via INTRAVESICAL

## 2021-09-27 MED ORDER — FENTANYL CITRATE (PF) 100 MCG/2ML IJ SOLN
INTRAMUSCULAR | Status: DC | PRN
  Administered 2021-09-27 (×3): 25 ug via INTRAVENOUS

## 2021-09-27 MED ORDER — IOHEXOL 300 MG/ML  SOLN
INTRAMUSCULAR | Status: DC | PRN
  Administered 2021-09-27: 7 mL via URETHRAL

## 2021-09-27 MED ORDER — FENTANYL CITRATE PF 50 MCG/ML IJ SOSY: 25.0000 ug | PREFILLED_SYRINGE | INTRAMUSCULAR | Status: DC | PRN

## 2021-09-27 MED ORDER — PROPOFOL 10 MG/ML IV BOLUS
INTRAVENOUS | Status: AC
  Filled 2021-09-27: qty 20

## 2021-09-27 MED ORDER — CEPHALEXIN 500 MG PO CAPS: 500.0000 mg | ORAL_CAPSULE | Freq: Two times a day (BID) | ORAL | 0 refills | Status: AC

## 2021-09-27 MED ORDER — PHENYLEPHRINE 40 MCG/ML (10ML) SYRINGE FOR IV PUSH (FOR BLOOD PRESSURE SUPPORT)
PREFILLED_SYRINGE | INTRAVENOUS | Status: DC | PRN
  Administered 2021-09-27: 80 ug via INTRAVENOUS
  Administered 2021-09-27: 40 ug via INTRAVENOUS
  Administered 2021-09-27: 80 ug via INTRAVENOUS

## 2021-09-27 MED ORDER — PHENYLEPHRINE HCL (PRESSORS) 10 MG/ML IV SOLN
INTRAVENOUS | Status: AC
  Filled 2021-09-27: qty 2

## 2021-09-27 SURGICAL SUPPLY — 23 items
BAG URO CATCHER STRL LF (MISCELLANEOUS) ×2 IMPLANT
BASKET ZERO TIP NITINOL 2.4FR (BASKET) ×1 IMPLANT
BSKT STON RTRVL ZERO TP 2.4FR (BASKET) ×1
CATH URET 5FR 28IN OPEN ENDED (CATHETERS) ×2 IMPLANT
CATH URETERAL DUAL LUMEN 10F (MISCELLANEOUS) ×1 IMPLANT
CLOTH BEACON ORANGE TIMEOUT ST (SAFETY) ×2 IMPLANT
EXTRACTOR STONE NITINOL NGAGE (UROLOGICAL SUPPLIES) IMPLANT
GLOVE SURG ENC TEXT LTX SZ7.5 (GLOVE) ×2 IMPLANT
GOWN STRL REUS W/TWL XL LVL3 (GOWN DISPOSABLE) ×3 IMPLANT
GUIDEWIRE STR DUAL SENSOR (WIRE) IMPLANT
GUIDEWIRE ZIPWRE .038 STRAIGHT (WIRE) ×2 IMPLANT
KIT BALLN UROMAX 15FX4 (MISCELLANEOUS) IMPLANT
KIT BALLN UROMAX 26 75X4 (MISCELLANEOUS) ×2
LASER FIB FLEXIVA PULSE ID 365 (Laser) IMPLANT
MANIFOLD NEPTUNE II (INSTRUMENTS) ×2 IMPLANT
PACK CYSTO (CUSTOM PROCEDURE TRAY) ×2 IMPLANT
SHEATH URETERAL 12FRX35CM (MISCELLANEOUS) IMPLANT
STENT URET 6FRX24 CONTOUR (STENTS) ×1 IMPLANT
STENT URET 6FRX26 CONTOUR (STENTS) IMPLANT
TRACTIP FLEXIVA PULS ID 200XHI (Laser) IMPLANT
TRACTIP FLEXIVA PULSE ID 200 (Laser) ×2
TUBING CONNECTING 10 (TUBING) ×2 IMPLANT
TUBING UROLOGY SET (TUBING) ×2 IMPLANT

## 2021-09-27 NOTE — Anesthesia Postprocedure Evaluation (Signed)
Anesthesia Post Note  Patient: [name]  Procedure(s) Performed: CYSTOSCOPY/BILATERAL RETROGRADE/URETEROSCOPY/HOLMIUM LASER/STENT PLACEMENT (Right)     Patient location during evaluation: PACU Anesthesia Type: General Level of consciousness: awake Pain management: pain level controlled Vital Signs Assessment: post-procedure vital signs reviewed and stable Respiratory status: spontaneous breathing Cardiovascular status: stable Postop Assessment: no apparent nausea or vomiting Anesthetic complications: no   No notable events documented.  Last Vitals:  Vitals:   09/27/21 1113 09/27/21 1115  BP: (!) 148/57 (!) 133/54  Pulse: 72 73  Resp: 10 12  Temp: 36.6 C   SpO2: 98% 100%    Last Pain:  Vitals:   09/27/21 1115  TempSrc:   PainSc: 0-No pain                 Kameron Blethen

## 2021-09-27 NOTE — Anesthesia Procedure Notes (Signed)
Procedure Name: LMA Insertion Date/Time: 09/27/2021 9:30 AM Performed by: Maxwell Caul, CRNA Pre-anesthesia Checklist: Patient identified, Emergency Drugs available, Suction available and Patient being monitored Patient Re-evaluated:Patient Re-evaluated prior to induction Oxygen Delivery Method: Circle system utilized Preoxygenation: Pre-oxygenation with 100% oxygen Induction Type: IV induction LMA: LMA inserted LMA Size: 4.0 Number of attempts: 1 Placement Confirmation: positive ETCO2 and breath sounds checked- equal and bilateral Tube secured with: Tape Dental Injury: Teeth and Oropharynx as per pre-operative assessment

## 2021-09-27 NOTE — Transfer of Care (Signed)
Immediate Anesthesia Transfer of Care Note  Patient: [name]  Procedure(s) Performed: CYSTOSCOPY/BILATERAL RETROGRADE/URETEROSCOPY/HOLMIUM LASER/STENT PLACEMENT (Right)  Patient Location: PACU  Anesthesia Type:General  Level of Consciousness: awake, alert  and oriented  Airway & Oxygen Therapy: Patient Spontanous Breathing and Patient connected to face mask oxygen  Post-op Assessment: Report given to RN and Post -op Vital signs reviewed and stable  Post vital signs: Reviewed and stable  Last Vitals:  Vitals Value Taken Time  BP    Temp    Pulse 79 09/27/21 1037  Resp 15 09/27/21 1037  SpO2 100 % 09/27/21 1037  Vitals shown include unvalidated device data.  Last Pain:  Vitals:   09/27/21 0659  TempSrc: Oral         Complications: No notable events documented.

## 2021-09-27 NOTE — Op Note (Signed)
Operative Note  Preoperative diagnosis:  1.  Multiple right distal ureteral calculi 2.  Right-sided hydronephrosis   Postoperative diagnosis: 1.  Multiple right distal ureteral calculi 2.  Stenosis of the right distal ureter  Procedure(s): 1.  Cystoscopy with right ureteroscopy, holmium laser lithotripsy and right ureteral stent placement 2.  Right ureteral dilation 3.  Bilateral retrograde pyelograms with intraoperative interpretation of fluoroscopic imaging  Surgeon: Ellison Hughs, MD  Assistants:  None  Anesthesia:  General  Complications:  None  EBL: Less than 5 mL  Specimens: 1.  Right distal ureteral stone fragments  Drains/Catheters: 1.  Right 6 French, 24 cm JJ stent without tether  Intraoperative findings:   Right retrograde pyelogram revealed a filling defect within the distal aspects of the right ureter, consistent with the distal ureteral stone burden seen on recent CT.  Only the distal aspects of the right ureter could be opacified with Omnipaque. Stenosis of the distal aspects of the right ureter that would not allow passage of the semirigid ureteroscope and required balloon dilation.  Following balloon dilation, the distal aspects of the right ureter was widely patent and would easily accommodate the semirigid ureteroscope. Solitary left collecting system with no filling defects or dilation involving the left ureter or left renal pelvis seen on retrograde pyelogram   Indication:  [name] is a 87 y.o. female with a long history of kidney stones.  During a routine follow-up on 09/08/2021, she was found to have persistent right-sided hydronephrosis on renal ultrasound.  Subsequent CT stone study revealed multiple right distal ureteral calculi associated with right-sided nephrosis.  She denies any significant right-sided flank pain due to her stones, but is complaining of left-sided flank pain in the preoperative area today.  She denies nausea/vomiting,  fever/chills, dysuria or hematuria.  She has been consented for the above procedures, voices understanding and wishes to proceed.  Description of procedure:  After informed consent was obtained, the patient was brought to the operating room and general LMA anesthesia was administered. The patient was then placed in the dorsolithotomy position and prepped and draped in the usual sterile fashion. A timeout was performed. A 23 French rigid cystoscope was then inserted into the urethral meatus and advanced into the bladder under direct vision. A complete bladder survey revealed no intravesical pathology.  A 5 French ureteral catheter was then inserted into the right ureteral orifice and a retrograde pyelogram was obtained, with the findings listed above.  A Glidewire was then used to intubate the lumen of the ureteral catheter and was advanced up to the right renal pelvis, under fluoroscopic guidance.  The catheter was then removed, leaving the wire in place.  A semirigid ureteroscope was then advanced into the bladder.  I made several attempts to advance the semirigid ureteroscope into the distal aspects of the right ureter, but significant resistance was met.  I attempted to dilate the distal right ureter with a 10 French dual-lumen catheter, but was unsuccessful.  Ultimately advanced a 4 cm balloon dilator over the wire and into the distal aspects of the right ureter, under fluoroscopic guidance.  The balloon dilator was then inflated up to 12 [place] with no waist seen fluoroscopically.  The ureteral dilator was then deflated and removed, leaving the wire in place.  I then replaced the semirigid ureteroscope, which was then easily navigated into the right distal ureter where multiple stones were identified.  A 200 m holmium laser was then used to fracture the stones into numerous  smaller pieces.  A 0 tip basket was then used to extract all stone fragments from the lumen of the right ureter.  The semirigid  ureteroscope was then advanced over the right renal pelvis with no residual stone debris was identified.  I then placed a 6 [place], 24 cm JJ stent over the wire and into good position within the right collecting system, confirming placement via fluoroscopy.  A 5 French ureteral catheter was then inserted into the left ureteral orifice and a retrograde pyelogram was obtained, with the findings listed above.    The patient's bladder was drained.  She tolerated the procedure well and was transferred to the postanesthesia in stable condition.   Plan: Due to the degree of stenosis in the distal aspect of the right ureter, which is likely from her 11 mm impacted distal ureteral stone that was treated in June, I will leave the stent in place for 3 weeks to allow the ureter to heal.

## 2021-09-27 NOTE — H&P (Signed)
PRE-OP H&P  Office Visit Report     09/08/2021   --------------------------------------------------------------------------------   [name]  MRN: [id number]  DOB: [birth year], 87 year old Female   PRIMARY CARE:  [name], MD  REFERRING:  Glena Norfolk. [name], MD  PROVIDER:  Ellison Hughs, M.D.  LOCATION:  [facility] [phone]     --------------------------------------------------------------------------------   CC/HPI: CC: Kidney stones   HPI: [name] is an 87 year old female with long history of kidney stones with urosepsis, requiring multiple ureteroscopies .   01/20/2019: She underwent URS with Dr [name] for her left sided ureteral stone obstruction. There were no documented complications from the procedure. All visible obstructing stones were removed. A ureteral stent was left in place and she returns for stent removal this morning. She's had expected increased frequency/urgency and burning with urination since her procedure. These have been tolerable per the patient. She denies persistent gross hematuria. Denies significant colic pain. Denies fevers, chills, n/v. She began doxycycline for a toe infection from another provider this morning.   02/13/19: [name] is here today for a routine RUS following her left URS on 01/06/19 and subsequent stent removal. She is doing well and denies flank pain, dysuria, hematuria or nausea/vomiting.   04/09/19: [name] and I met via Telehealth to discuss her 24 hour urine results, which showed that her urine output is markedly low (0.62 L/day). Her hypercalciuria profile was WNL. She states that she has passed two small stones since her last visit and is having mild, dull, intermittent flank pain associated with an "orange-ish discoloration" of her urine or the past 24 hours. She denies N/V/F/C, dysuria or gross hematuria.   05/02/20: The patient is here today for a routine follow-up. She was recently  hospitalized due to E. Coli urosepsis from an obstructing right distal ureteral stone, s/p right PCN placement on 04/19/20. She is doing well and denies interval fevers/chills, dysuria or hematuria. Right PCN in place and draining yellow-urine. She does report intermittent episodes of sharp right-sided flank pain centered around her nephrostomy tube, but states that it is manageable without narcotic pain medication.   05/17/20: 87 year old female was recently discharged from hospital after treatment from urosepsis caused by an obstructing right sided stone, had PCN placed. She was scheduled for surgery on 05/12/20 but was not yet cleared by cardiologist and therefore surgery was rescheduled to 05/24/20. Family brings her in today with concerns of infection. They noticed a significant amount of gross hematuria in the nephrostomy bag as well as intermittent bouts of confusion. Per the patient, she had not experienced any pain and family reports that she has not experienced any fevers, chills or dysuria. Nephrostomy bag has slight pink tinged urine.   07/07/20: The patient is here today for a routine follow-up and bilateral stent removal following ureteroscopy on 05/24/20. Her right ureter was severely impacted from her large distal stone, requiring prolonged stent placement. In the interim, the patient reports persistent episodes of blood tinged urine along with dysuria. She states that she has passed several small stone fragments since surgery, but denies significant flank pain or fever/chills.   09/02/20: The patient is here today for routine follow-up following her recent ureteroscopy. She has done well since surgery and denies interval episodes of flank pain, dysuria, hematuria or stone passage. She is urinating without difficulty and feels like she is emptying her bladder well.   03/07/2021: The patient is here today for routine follow-up. She  has done well since her last visit and denies interval stone passage,  flank pain, dysuria or hematuria. She also denies interval UTIs. Tolerating K-cit without any associated side effects. Recent 24-hour urine showed a 24-hour urine output of 1.34 L as well as a elevated supersaturation of calcium oxalate. Otherwise, her study was WNL.   09/08/21: The patient is here today for a routine follow-up. Recent urine cultures from her facility from June and July have grown Pseudomonas. She was seen by ID was treated with a course of Cefdinir and Meropenem, respectively. She denies interval UTIs, dysuria or hematuria, but notes that she may have seen a small stone in the toilet 2-3 weeks ago. She denies flank pain, nausea/vomiting or fever/chills today. Reports on going fatigue after a recent fall from standing.     ALLERGIES: Codeine Derivatives Lactose latex lyrica morphine Plaquenil reglan Requip Septra Shellfish sulfa    MEDICATIONS: Aspirin  Doxycycline Hyclate 100 mg capsule  Levothyroxine Sodium 50 mcg tablet  Acetaminophen 325 mg tablet  Calcium + D  Carbidopa-Levodopa 25 mg-100 mg tablet  Eucerin 0.1 % lotion  Ferrous Sulfate  Florastor  Furosemide 20 mg tablet  Hydroxychloroquine Sulfate 200 mg tablet  Levothyroxine 50 mcg capsule  Lovastatin 40 mg tablet Oral  Metoprolol Tartrate 25 mg tablet Oral  Mirapex 1 mg tablet  Multivitamin  Mupirocin 2 % ointment  Nystatin  Pepcid 20 mg tablet  Potassium Citrate Er  Pramipexole Dihydrochloride 1 mg tablet Oral  Preservision Areds  Protonix 40 mg tablet, delayed release  Rosuvastatin Calcium 20 mg tablet  Seroquel 25 mg tablet  Sinemet 25-100  Systane  Tramadol Hcl 50 mg tablet Oral  Vitamin D2 50 mcg (2,000 unit) capsule     GU PSH: Cysto Remove Stent FB Sim - 07/07/2020, 2019-02-17 Hysterectomy Unilat SO - 02/17/14 Ureteroscopic laser litho, Bilateral - 05/24/2020, Left - 2019-02-17       PSH Notes: Breast Surgery, Hysterectomy, Wrist Surgery, Hip Surgery, Back Surgery   NON-GU PSH: Breast Surgery  Procedure - Feb 17, 2014 Hip Replacement, Right     GU PMH: Hydronephrosis - 03/07/2021 Renal calculus - 03/07/2021, (Stable), - 09/02/2020, Nephrolithiasis, - February 17, 2014, Calcium nephrolithiasis, - Feb 17, 2014 Chronic cystitis (w/o hematuria) - 09/02/2020, Chronic cystitis, - Feb 17, 2014 History of urolithiasis - 7412 Renal colic, Left - 8786 Ureteral calculus, Left - 02-17-19 Ureteral obstruction secondary to calculous, Left - 2019-02-17 Postmenopausal atrophic vaginitis, Senile (atrophic) vaginitis - 02/17/2014 Rectocele, Rectocele, female - February 17, 2014 Dysuria, Dysuria - 2014/02/17 Urinary Frequency, Urinary frequency - Feb 17, 2014    NON-GU PMH: Encounter for general adult medical examination without abnormal findings, Encounter for preventive health examination - 02/17/2014 Personal history of other diseases of the circulatory system, History of cardiac murmur - 02-17-14, History of hypertension, - February 17, 2014 Personal history of other diseases of the digestive system, History of esophageal reflux - 2014/02/17 Personal history of other diseases of the musculoskeletal system and connective tissue, History of arthritis - 02-17-14 Personal history of other endocrine, nutritional and metabolic disease, History of hypercholesterolemia - 2014-02-17, History of hypothyroidism, - 17-Feb-2014    FAMILY HISTORY: Death of family member - Runs In Family   SOCIAL HISTORY: Marital Status: Married     Notes: Alcohol use, Never a smoker, Married, Occupation, Number of children, Caffeine use   REVIEW OF SYSTEMS:    GU Review Female:   Patient denies frequent urination, hard to postpone urination, burning /pain with urination, get up at night to urinate, leakage of urine, stream starts and stops, trouble  starting your stream, have to strain to urinate, and being pregnant.  Gastrointestinal (Upper):   Patient denies nausea, vomiting, and indigestion/ heartburn.  Gastrointestinal (Lower):   Patient denies diarrhea and constipation.  Constitutional:   Patient denies fever, night sweats, weight loss,  and fatigue.  Skin:   Patient denies skin rash/ lesion and itching.  Eyes:   Patient denies blurred vision and double vision.  Ears/ Nose/ Throat:   Patient denies sore throat and sinus problems.  Hematologic/Lymphatic:   Patient denies swollen glands and easy bruising.  Cardiovascular:   Patient denies leg swelling and chest pains.  Respiratory:   Patient denies cough and shortness of breath.  Endocrine:   Patient denies excessive thirst.  Musculoskeletal:   Patient denies back pain and joint pain.  Neurological:   Patient denies headaches and dizziness.  Psychologic:   Patient denies depression and anxiety.   VITAL SIGNS:      09/08/2021 09:53 AM  Weight 128 lb / 58.06 kg  Height 59 in / 149.86 cm  BP 73/43 mmHg  Pulse 87 /min  Temperature 97.1 F / 36.1 C  BMI 25.9 kg/m   MULTI-SYSTEM PHYSICAL EXAMINATION:    Constitutional: Well-nourished. No physical deformities. Normally developed. Good grooming.  Neurologic / Psychiatric: Oriented to time, oriented to place, oriented to person. No depression, no anxiety, no agitation.  Musculoskeletal: Normal gait and station of head and neck.     Complexity of Data:  Records Review:   Previous Patient Records   PROCEDURES:         C.T. Urogram - [id number]      Patient confirmed No Neulasta OnPro Device.          KUB - [id number]  A single view of the abdomen is obtained.      Patient confirmed No Neulasta OnPro Device.  Small, bilateral calcifications are seen within each renal shadow. There are no calcifications seen along the expected course of either ureter or within the bladder. Neurostimulator in the lumbar region is noted. No other bony or bowel abnormalities are appreciated.           Renal Ultrasound - [id number]  Right Kidney: Length: 10.0 cm Depth: 4.9 cm Cortical Width: 1.0 cm Width: 4.4 cm  Left Kidney: Length: 9.1 cm Depth: 3.5 cm Cortical Width: 1.5 cm Width: 3.4 cm  Left Kidney/Ureter:  2 non-obstructing  calcifications.  Right Kidney/Ureter:  Increased hydro when compared with previous US. 2 non-obstructing calcifications.  Bladder:  PVR 36.5 ml      Difficult study due to pts mobility issues. Scanned in wheelchair.  Patient confirmed No Neulasta OnPro Device.           In and Out Cath for Specimen/ Medicare - [phone]  A 14 French red rubber or straight catheter was inserted into the bladder using sterile technique. A urinalysis was sent to the lab. A urine culture was sent to the lab. 120 cc of urine was obtained.         Urinalysis Dipstick Dipstick Cont'd  Color: Yellow Bilirubin: Neg mg/dL  Appearance: Clear Ketones: Neg mg/dL  Specific Gravity: 1.025 Blood: Neg ery/uL  pH: 6.0 Protein: Neg mg/dL  Glucose: Neg mg/dL Urobilinogen: 0.2 mg/dL    Nitrites: Neg    Leukocyte Esterase: Neg leu/uL    ASSESSMENT:      ICD-10 Details  1 GU:   Hydronephrosis - N13.0 Right, Chronic, Worsening  2   Chronic cystitis (w/o hematuria) - N30.20 Chronic, Stable  3   Renal calculus - N20.0 Chronic, Stable   PLAN:           Orders Labs BMP  X-Rays: C.T. Stone Protocol Without I.V. Contrast - Worsening right hydronephrosis, hx of kidney stones with urosepsis   X-Ray Notes: History:  Hematuria: Yes/No  Patient to see MD after exam: Yes/No  Previous exam: CT / IVP/ US/ KUB/ None  When:  Where:  Diabetic: Yes/ No  BUN/ Creatinine:  Date of last BUN Creatinine:  Weight in pounds:  Allergy- IV Contrast: Yes/ No  Conflicting diabetic meds: Yes/ No  Diabetic Meds:  Prior Authorization #: [id number]            Schedule X-Rays: 6 Months - Renal Ultrasound    6 Months - KUB  Return Visit/Planned Activity: 6 Months - Office Visit, Follow up MD          Document Letter(s):  Created for [name], MD   Created for Patient: Clinical Summary         Notes:  87 year old female with multiple right distal ureteral calculi measuring up to 6 mm and causing mild right sided  hydronephrosis  The risks, benefits and alternatives of cystoscopy with RIGHT ureteroscopy, laser lithotripsy and ureteral stent placement was discussed the patient.  Risks included, but are not limited to: bleeding, urinary tract infection, ureteral injury/avulsion, ureteral stricture formation, retained stone fragments, the possibility that multiple surgeries may be required to treat the stone(s), MI, stroke, PE and the inherent risks of general anesthesia.  The patient voices understanding and wishes to proceed.

## 2021-09-28 ENCOUNTER — Encounter (HOSPITAL_COMMUNITY): Payer: Self-pay | Admitting: Urology

## 2021-09-28 DIAGNOSIS — R29898 Other symptoms and signs involving the musculoskeletal system: Secondary | ICD-10-CM | POA: Diagnosis not present

## 2021-09-28 DIAGNOSIS — M6281 Muscle weakness (generalized): Secondary | ICD-10-CM | POA: Diagnosis not present

## 2021-09-28 DIAGNOSIS — R278 Other lack of coordination: Secondary | ICD-10-CM | POA: Diagnosis not present

## 2021-09-28 DIAGNOSIS — R2681 Unsteadiness on feet: Secondary | ICD-10-CM | POA: Diagnosis not present

## 2021-09-28 DIAGNOSIS — M25522 Pain in left elbow: Secondary | ICD-10-CM | POA: Diagnosis not present

## 2021-09-28 DIAGNOSIS — N3941 Urge incontinence: Secondary | ICD-10-CM | POA: Diagnosis not present

## 2021-09-29 DIAGNOSIS — R278 Other lack of coordination: Secondary | ICD-10-CM | POA: Diagnosis not present

## 2021-09-29 DIAGNOSIS — M25522 Pain in left elbow: Secondary | ICD-10-CM | POA: Diagnosis not present

## 2021-09-29 DIAGNOSIS — R2681 Unsteadiness on feet: Secondary | ICD-10-CM | POA: Diagnosis not present

## 2021-09-29 DIAGNOSIS — R29898 Other symptoms and signs involving the musculoskeletal system: Secondary | ICD-10-CM | POA: Diagnosis not present

## 2021-09-29 DIAGNOSIS — M6281 Muscle weakness (generalized): Secondary | ICD-10-CM | POA: Diagnosis not present

## 2021-09-29 DIAGNOSIS — N3941 Urge incontinence: Secondary | ICD-10-CM | POA: Diagnosis not present

## 2021-10-02 DIAGNOSIS — N3941 Urge incontinence: Secondary | ICD-10-CM | POA: Diagnosis not present

## 2021-10-02 DIAGNOSIS — M6281 Muscle weakness (generalized): Secondary | ICD-10-CM | POA: Diagnosis not present

## 2021-10-02 DIAGNOSIS — R278 Other lack of coordination: Secondary | ICD-10-CM | POA: Diagnosis not present

## 2021-10-02 DIAGNOSIS — R29898 Other symptoms and signs involving the musculoskeletal system: Secondary | ICD-10-CM | POA: Diagnosis not present

## 2021-10-02 DIAGNOSIS — M25522 Pain in left elbow: Secondary | ICD-10-CM | POA: Diagnosis not present

## 2021-10-02 DIAGNOSIS — R2681 Unsteadiness on feet: Secondary | ICD-10-CM | POA: Diagnosis not present

## 2021-10-03 ENCOUNTER — Non-Acute Institutional Stay (SKILLED_NURSING_FACILITY): Payer: Medicare Other | Admitting: Nurse Practitioner

## 2021-10-03 ENCOUNTER — Encounter: Payer: Self-pay | Admitting: Nurse Practitioner

## 2021-10-03 DIAGNOSIS — M6281 Muscle weakness (generalized): Secondary | ICD-10-CM | POA: Diagnosis not present

## 2021-10-03 DIAGNOSIS — I350 Nonrheumatic aortic (valve) stenosis: Secondary | ICD-10-CM | POA: Diagnosis not present

## 2021-10-03 DIAGNOSIS — I5032 Chronic diastolic (congestive) heart failure: Secondary | ICD-10-CM

## 2021-10-03 DIAGNOSIS — E039 Hypothyroidism, unspecified: Secondary | ICD-10-CM

## 2021-10-03 DIAGNOSIS — M154 Erosive (osteo)arthritis: Secondary | ICD-10-CM | POA: Diagnosis not present

## 2021-10-03 DIAGNOSIS — R278 Other lack of coordination: Secondary | ICD-10-CM | POA: Diagnosis not present

## 2021-10-03 DIAGNOSIS — L821 Other seborrheic keratosis: Secondary | ICD-10-CM | POA: Diagnosis not present

## 2021-10-03 DIAGNOSIS — R29898 Other symptoms and signs involving the musculoskeletal system: Secondary | ICD-10-CM | POA: Diagnosis not present

## 2021-10-03 DIAGNOSIS — M25522 Pain in left elbow: Secondary | ICD-10-CM | POA: Diagnosis not present

## 2021-10-03 DIAGNOSIS — K219 Gastro-esophageal reflux disease without esophagitis: Secondary | ICD-10-CM | POA: Diagnosis not present

## 2021-10-03 DIAGNOSIS — L988 Other specified disorders of the skin and subcutaneous tissue: Secondary | ICD-10-CM | POA: Diagnosis not present

## 2021-10-03 DIAGNOSIS — D508 Other iron deficiency anemias: Secondary | ICD-10-CM

## 2021-10-03 DIAGNOSIS — G20 Parkinson's disease: Secondary | ICD-10-CM | POA: Diagnosis not present

## 2021-10-03 DIAGNOSIS — R2681 Unsteadiness on feet: Secondary | ICD-10-CM | POA: Diagnosis not present

## 2021-10-03 DIAGNOSIS — L814 Other melanin hyperpigmentation: Secondary | ICD-10-CM | POA: Diagnosis not present

## 2021-10-03 DIAGNOSIS — Z85068 Personal history of other malignant neoplasm of small intestine: Secondary | ICD-10-CM | POA: Diagnosis not present

## 2021-10-03 DIAGNOSIS — N3941 Urge incontinence: Secondary | ICD-10-CM | POA: Diagnosis not present

## 2021-10-03 DIAGNOSIS — L570 Actinic keratosis: Secondary | ICD-10-CM | POA: Diagnosis not present

## 2021-10-03 DIAGNOSIS — K5909 Other constipation: Secondary | ICD-10-CM | POA: Diagnosis not present

## 2021-10-03 DIAGNOSIS — N200 Calculus of kidney: Secondary | ICD-10-CM

## 2021-10-03 DIAGNOSIS — F323 Major depressive disorder, single episode, severe with psychotic features: Secondary | ICD-10-CM | POA: Diagnosis not present

## 2021-10-03 NOTE — Assessment & Plan Note (Signed)
Depression/hallucination, stabilizing, takes Sertraline,  Quetiapine

## 2021-10-03 NOTE — Assessment & Plan Note (Signed)
takes Plaquenil, Tylenol  f/u Rheumatology.

## 2021-10-03 NOTE — Assessment & Plan Note (Signed)
/  trace edema BLE, takes Furosemide, cardiology, severe aortic valve stenosis. Echocardiogram 11/15/20 EF 60-65%. Bun/creat 14/0.85 09/27/21

## 2021-10-03 NOTE — Assessment & Plan Note (Signed)
Stable,  takes Senokot S, Amitiza, Colace

## 2021-10-03 NOTE — Assessment & Plan Note (Signed)
rena lithiasis, recurrent UTI, urosepsis, treated by ID. 09/19/21 cystoscopy, laser lithotripsy, R ureteral stent/dilatation

## 2021-10-03 NOTE — Assessment & Plan Note (Signed)
takes Pantoprazole 40mg  qd, Famotidine 20mg  qd. yearly FOBT per GI, Hgb 8.9 09/27/21

## 2021-10-03 NOTE — Assessment & Plan Note (Addendum)
No significant change in posture, gait, fine tremor in fingers, taking Sinemet.

## 2021-10-03 NOTE — Assessment & Plan Note (Signed)
AS Cardiology palliative approach

## 2021-10-03 NOTE — Progress Notes (Addendum)
Location:   SNF [place] Room Number: 45 Place of Service:  SNF (31) Provider: [facility] Mikaiya Tramble NP  [name], MD  Patient Care Team: [name], MD as PCP - General (Internal Medicine) [name], MD as PCP - Cardiology (Cardiology) [name], MD (Neurosurgery) [name], MD (Neurology) [name], MD (Inactive) as Consulting Physician (Gastroenterology) [name], MD as Consulting Physician (Urology) Thaddius Manes X, NP as Nurse Practitioner (Internal Medicine) Tat, Eustace Quail, DO as Consulting Physician (Neurology)  Extended Emergency Contact Information Primary Emergency Contact: [facility] Address: [address]          [place], [place] [address] [name] of [place] Phone: [phone] Mobile Phone: [phone] Relation: Spouse Secondary Emergency Contact: [name] Mobile Phone: [phone] Relation: Daughter Preferred language: [name] Interpreter needed? No  Code Status:  DNR Goals of care: Advanced Directive information Advanced Directives 09/27/2021  Does Patient Have a Medical Advance Directive? Yes  Type of Advance Directive -  Does patient want to make changes to medical advance directive? -  Copy of [place] in Chart? -  Would patient like information on creating a medical advance directive? -  Pre-existing out of facility DNR order (yellow form or pink MOST form) -     Chief Complaint  Patient presents with  . Medical Management of Chronic Issues     HPI:  Pt is a 87 y.o. female seen today for medical management of chronic diseases.    Hx of cosed fracture of multiple ribs of left side with routine healing, takes Tramadol, pain is better controlled.             Hx of rena lithiasis, recurrent UTI, urosepsis, treated by ID. 09/19/21 cystoscopy, laser lithotripsy, R ureteral stent/dilatation             GERD, takes Pantoprazole 40mg  qd, Famotidine 20mg  qd. yearly  FOBT per GI, Hgb 8.9 09/27/21             Parkinson's, takes Sinemet              Depression/hallucination, stabilizing, takes Sertraline,  Quetiapine             Constipation, takes Senokot S, Amitiza, Colace             Hypothyroidism, takes Levothyroxine 18mcg qd. TSH 1.62 04/11/21             Erosive arthritis, takes Plaquenil, Tylenol  f/u Rheumatology.              CHF/trace edema BLE, takes Furosemide, cardiology, severe aortic valve stenosis. Echocardiogram 11/15/20 EF 60-65%. Bun/creat 14/0.85 09/27/21             Anemia, takes Fe, Hgb 11 04/11/21>>8.9 09/27/21             AS Cardiology palliative approach   Past Medical History:  Diagnosis Date  . Abnormal nuclear stress test   . Actinic cheilitis 04/23/2019  . Anemia    iron deficiency   . Anxiety   . Aortic stenosis    severe by echo 2021   . Arthritis   . Broken arm    right   . Chronic diastolic CHF (congestive heart failure) ([place])   . Chronic pain   . Cognitive communication deficit   . Cognitive deficits    Communication  . Constipation   . Depression   . Erosive (osteo)arthritis 03/09/2020  . Family history of adverse  reaction to anesthesia    pt. states sister vomits  . Fibromyalgia   . GERD (gastroesophageal reflux disease)   . H/O hiatal hernia   . Hallucinations, visual 07/21/2018   03/10/20 psych consult.   . Hematuria 05/18/2020  . History of bronchitis   . History of kidney stones   . Hypercholesterolemia   . Hypertension    dr t turner  . Hypothyroidism   . Memory loss 04/21/2018   08/08/18 CT head no traumatic findings, atrophy.  09/27/19 MMSE 25/30, failed the clock drawing  . Microhematuria 03/24/2015  . Neuropathy   . Osteoporosis   . Parkinson disease ([place])   . PVD (peripheral vascular disease) (HCC)    99% stenosis of left anteiror tibial artery, mod stenosis of left distal SFA and popliteal artery followed by [name]  . RBBB    noted on EKG 2018  . Restless leg syndrome   . Sepsis  ([place])    hx of due to [facility]  . Septic shock ([place]) 04/19/2020  . Shortness of breath    with exertion - chronic   . Sjogren's disease ([place])   . Sjogren's syndrome [facility]) 02/04/2018   04/16/19 rheumatology: Erosive OA of hands, Sjogrens syndrome, low back pain at multiple sites, recurrent kidney stones, age related osteoporosis w/o current pathological fracture, f/u 6 months.   . SOB (shortness of breath)    chronic due to diastolic dysfunction, deconditioning, obesity  . Spinal stenosis    lumbar region   . Tremor   . Unstable gait 04/21/2018  . Unsteadiness on feet   . Urinary frequency 09/18/2018  . Urinary tract infection   . Visual hallucinations    Past Surgical History:  Procedure Laterality Date  . ABDOMINAL HYSTERECTOMY    . BACK SURGERY     4 back surgeries,   lumbar fusion  . CARDIAC CATHETERIZATION  2006   normal  . CYSTOSCOPY/URETEROSCOPY/HOLMIUM LASER/STENT PLACEMENT Left 01/06/2019   Procedure: CYSTOSCOPY/RETROGRADE/URETEROSCOPY/HOLMIUM LASER/BASKET RETRIEVAL/STENT PLACEMENT;  Surgeon: Ceasar Mons, MD;  Location: [facility];  Service: Urology;  Laterality: Left;  . CYSTOSCOPY/URETEROSCOPY/HOLMIUM LASER/STENT PLACEMENT Bilateral 05/24/2020   Procedure: CYSTOSCOPY/RETROGRADE/URETEROSCOPY/HOLMIUM LASER/STENT PLACEMENT;  Surgeon: Ceasar Mons, MD;  Location: [facility];  Service: Urology;  Laterality: Bilateral;  . CYSTOSCOPY/URETEROSCOPY/HOLMIUM LASER/STENT PLACEMENT Right 09/27/2021   Procedure: CYSTOSCOPY/BILATERAL RETROGRADE/URETEROSCOPY/HOLMIUM LASER/STENT PLACEMENT;  Surgeon: Ceasar Mons, MD;  Location: [facility];  Service: Urology;  Laterality: Right;  ONLY NEEDS 60 MIN  . EYE SURGERY Bilateral    cateracts  . falls     variious fall, broken wrist,and toes  . FRACTURE SURGERY Right April 2016   Wrist, Pt. fell  . IR NEPHROSTOMY PLACEMENT RIGHT  04/19/2020  . kidney stone removal Left 01/20/2019  . RIGHT/LEFT HEART CATH AND CORONARY  ANGIOGRAPHY N/A 02/05/2018   Procedure: RIGHT/LEFT HEART CATH AND CORONARY ANGIOGRAPHY;  Surgeon: Troy Sine, MD;  Location: [place] CV LAB;  Service: Cardiovascular;  Laterality: N/A;  . SPINAL CORD STIMULATOR INSERTION N/A 09/09/2015   Procedure: LUMBAR SPINAL CORD STIMULATOR INSERTION;  Surgeon: Clydell Hakim, MD;  Location: [place] NEURO ORS;  Service: Neurosurgery;  Laterality: N/A;  LUMBAR SPINAL CORD STIMULATOR INSERTION  . SPINE SURGERY  April 2013   Back X's 4  . TOTAL HIP ARTHROPLASTY     right  . WRIST FRACTURE SURGERY Bilateral     Allergies  Allergen Reactions  . Adrenalone   . Lactose Other (See Comments)    abd pain, lactose intolerant  .  Morphine Other (See Comments)  . Sulfa Antibiotics Other (See Comments)    Headache, very sick  . Codeine Other (See Comments)    Reaction:  Headaches and nightmares   . Lactose Intolerance (Gi) Nausea And Vomiting  . Latex Rash  . Lyrica [Pregabalin] Swelling and Other (See Comments)    Reaction:  Leg swelling  . Other Other (See Comments)    Pt states that pain medications give her nightmares.    . Plaquenil [Hydroxychloroquine Sulfate] Other (See Comments)    Reaction:  GI upset   . Reglan [Metoclopramide] Other (See Comments)    Reaction:  GI upset   . Requip [Ropinirole Hcl] Other (See Comments)    Reaction:  GI upset   . Septra [Sulfamethoxazole-Trimethoprim] Nausea And Vomiting  . Shellfish Allergy Nausea And Vomiting    Allergies as of 10/03/2021       Reactions   Adrenalone    Lactose Other (See Comments)   abd pain, lactose intolerant   Morphine Other (See Comments)   Sulfa Antibiotics Other (See Comments)   Headache, very sick   Codeine Other (See Comments)   Reaction:  Headaches and nightmares    Lactose Intolerance (gi) Nausea And Vomiting   Latex Rash   Lyrica [pregabalin] Swelling, Other (See Comments)   Reaction:  Leg swelling   Other Other (See Comments)   Pt states that pain medications give her  nightmares.     Plaquenil [hydroxychloroquine Sulfate] Other (See Comments)   Reaction:  GI upset    Reglan [metoclopramide] Other (See Comments)   Reaction:  GI upset    Requip [ropinirole Hcl] Other (See Comments)   Reaction:  GI upset    Septra [sulfamethoxazole-trimethoprim] Nausea And Vomiting   Shellfish Allergy Nausea And Vomiting        Medication List        Accurate as of October 03, 2021 11:59 PM. If you have any questions, ask your nurse or doctor.          acetaminophen 500 MG tablet Commonly known as: TYLENOL Take 500 mg by mouth See admin instructions. Take 1 tablet (500 mg) by mouth scheduled 3 times daily & as needed up to 3 times daily as needed for pain (Not to exceed 3g/24 hrs) PRN & Scheduled doses   AeroChamber MV inhaler by Other route. Use as instructed as needed   Calcium 600/Vitamin D3 600-20 MG-MCG Tabs Generic drug: Calcium Carb-Cholecalciferol Take 1 tablet by mouth daily.   carbidopa-levodopa 25-100 MG tablet Commonly known as: SINEMET IR Take 2 tablets by mouth 2 (two) times daily.   docusate sodium 100 MG capsule Commonly known as: COLACE Take 100 mg by mouth at bedtime.   eucerin cream Apply 1 application topically daily.   famotidine 20 MG tablet Commonly known as: PEPCID Take 20 mg by mouth daily.   furosemide 20 MG tablet Commonly known as: LASIX Take 1 tablet (20 mg total) by mouth daily. What changed:  when to take this additional instructions   hydroxychloroquine 200 MG tablet Commonly known as: PLAQUENIL Take 200 mg by mouth daily.   levothyroxine 50 MCG tablet Commonly known as: SYNTHROID Take 50 mcg by mouth daily before breakfast.   lubiprostone 24 MCG capsule Commonly known as: AMITIZA Take 24 mcg by mouth 2 (two) times daily with a meal.   multivitamin with minerals Tabs tablet Take 1 tablet by mouth daily.   nystatin powder Commonly known as: MYCOSTATIN/NYSTOP Apply topically 2 (two) times  daily as  needed. What changed: reasons to take this   pantoprazole 40 MG tablet Commonly known as: PROTONIX Take 40 mg by mouth daily.   potassium citrate 10 MEQ (1080 MG) SR tablet Commonly known as: UROCIT-K Take 10 mEq by mouth 2 (two) times daily.   pramipexole 0.5 MG tablet Commonly known as: MIRAPEX Take 0.5 mg by mouth at bedtime.   PRESERVISION AREDS 2 PO Take 1 tablet by mouth daily.   QUEtiapine 25 MG tablet Commonly known as: SEROQUEL Take 12.5-25 mg by mouth See admin instructions. Take 0.5 tablet (12.5 mg) by mouth in the morning & take 1 tablet (25 mg) by mouth in the evening.   rosuvastatin 20 MG tablet Commonly known as: CRESTOR Take 20 mg by mouth daily.   saccharomyces boulardii 250 MG capsule Commonly known as: FLORASTOR Take 250 mg by mouth daily.   senna-docusate 8.6-50 MG tablet Commonly known as: Senokot-S Take 1 tablet by mouth at bedtime.   sertraline 50 MG tablet Commonly known as: ZOLOFT Take 100 mg by mouth at bedtime.   Slow Fe 142 (45 Fe) MG Tbcr Generic drug: Ferrous Sulfate Take 142 mg by mouth in the morning.   Systane 0.4-0.3 % Soln Generic drug: Polyethyl Glycol-Propyl Glycol Apply 1 drop to eye 2 (two) times daily as needed (dry eyes).   traMADol 50 MG tablet Commonly known as: ULTRAM Take 1 tablet (50 mg total) by mouth 3 (three) times daily. What changed: how much to take   Vitamin D 50 MCG (2000 UT) tablet Take 2,000 Units by mouth daily.        Review of Systems  Constitutional:  Positive for fatigue. Negative for fever and unexpected weight change.  HENT:  Positive for hearing loss. Negative for congestion and voice change.   Eyes:  Negative for visual disturbance.  Respiratory:  Positive for shortness of breath. Negative for cough.        Chronic DOE  Cardiovascular:  Positive for leg swelling.  Gastrointestinal:  Negative for abdominal pain, constipation, nausea and vomiting.  Genitourinary:  Positive for frequency.  Negative for dysuria, hematuria and urgency.  Musculoskeletal:  Positive for arthralgias, back pain, gait problem and myalgias.       Chronic lower back pain. The R 3rd finger mid knuckle pain is chronic.   Skin:  Negative for color change.  Neurological:  Positive for tremors. Negative for speech difficulty, weakness and light-headedness.       Memory lapses. Minimal resting tremor in fingers, not disabling.   Psychiatric/Behavioral:  Positive for hallucinations. Negative for behavioral problems and sleep disturbance. The patient is not nervous/anxious.    Immunization History  Administered Date(s) Administered  . Hepatitis A, Adult 03/01/1997, 09/20/1997  . Influenza Split 09/09/2013, 09/27/2014  . Influenza, High Dose Seasonal PF 08/01/2011, 08/27/2012, 10/14/2013, 09/08/2015, 09/10/2016, 09/12/2017, 09/12/2018, 09/15/2019  . Influenza,inj,Quad PF,6+ Mos 09/11/2016  . Influenza-Unspecified 09/21/2020  . Moderna Sars-Covid-2 Vaccination 12/12/2019, 01/09/2020, 10/18/2020, 05/09/2021, 08/29/2021  . Pneumococcal Conjugate-13 04/15/2014  . Pneumococcal Polysaccharide-23 09/28/2005  . Td 06/20/2016  . Tdap 04/29/2006, 09/01/2015  . Zoster Recombinat (Shingrix) 06/11/2018, 09/10/2018  . Zoster, Live 04/29/2006   Pertinent  Health Maintenance Due  Topic Date Due  . INFLUENZA VACCINE  07/10/2021  . DEXA SCAN  Completed   Fall Risk 07/13/2021 08/03/2021 08/04/2021 08/09/2021 09/27/2021  Falls in the past year? 0 1 1 1  -  Was there an injury with Fall? 0 1 1 1  -  Was there an  injury with Fall? - - - - -  Fall Risk Category Calculator 0 2 3 2  -  Fall Risk Category Low Moderate High Moderate -  Patient Fall Risk Level Low fall risk - High fall risk High fall risk High fall risk  Patient at Risk for Falls Due to - - - History of fall(s) -  Patient at Risk for Falls Due to - - - - -  Fall risk Follow up - - - Falls evaluation completed;Education provided -   Functional Status Survey:     Vitals:   10/03/21 1609  BP: 106/76  Pulse: 62  Resp: 18  Temp: (!) 97.3 F (36.3 C)  SpO2: 99%  Weight: 127 lb 6.4 oz (57.8 kg)   Body mass index is 26.63 kg/m. Physical Exam Vitals and nursing note reviewed.  Constitutional:      Appearance: Normal appearance.  HENT:     Head: Normocephalic and atraumatic.     Mouth/Throat:     Mouth: Mucous membranes are moist.  Eyes:     Extraocular Movements: Extraocular movements intact.     Conjunctiva/sclera: Conjunctivae normal.     Pupils: Pupils are equal, round, and reactive to light.  Cardiovascular:     Rate and Rhythm: Normal rate and regular rhythm.     Heart sounds: Murmur heard.  Pulmonary:     Effort: Pulmonary effort is normal.     Breath sounds: No rhonchi or rales.  Abdominal:     General: Bowel sounds are normal.     Palpations: Abdomen is soft.     Tenderness: There is no abdominal tenderness.  Musculoskeletal:     Cervical back: Normal range of motion and neck supple.     Right lower leg: Edema present.     Left lower leg: Edema present.     Comments: Trace edema BLE.  Arthritic changes in fingers, R>L. The R 3rd finger mid knuckle swelling, no heat or redness. Chronic lower back pain.   Skin:    General: Skin is warm and dry.  Neurological:     General: No focal deficit present.     Mental Status: She is alert. Mental status is at baseline.     Gait: Gait abnormal.     Comments: Oriented to person, place. Ambulates with walker.   Psychiatric:        Mood and Affect: Mood normal.        Behavior: Behavior normal.     Comments: Visual hallucination vs delusion    Labs reviewed: Recent Labs    11/22/20 1109 04/12/21 0005 09/27/21 0720  NA 141 140 140  K 4.2 4.2 4.3  CL 104 104 108  CO2 22 25* 24  GLUCOSE 104*  --  90  BUN 17 18 14   CREATININE 1.01* 0.9 0.85  CALCIUM 10.0 9.8 9.4   Recent Labs    04/12/21 0005  AST 19  ALT 8  ALKPHOS 78  ALBUMIN 4.1   Recent Labs    04/12/21 0005  09/27/21 0720  WBC 6.7 5.0  NEUTROABS 4,147.00  --   HGB 11.0* 8.9*  HCT 34* 29.4*  MCV  --  98.3  PLT 282 307   Lab Results  Component Value Date   TSH 1.62 04/12/2021   No results found for: HGBA1C Lab Results  Component Value Date   CHOL 105 10/05/2020   HDL 46 10/05/2020   LDLCALC 38 10/05/2020   TRIG 119 10/05/2020   CHOLHDL 2.4  07/14/2019    Significant Diagnostic Results in last 30 days:  DG C-Arm 1-60 Min  Result Date: 09/27/2021 CLINICAL DATA:  Cystoscopy with right ureteroscopy and laser lithotripsy. Right ureteral stent placement. Right ureteral dilatation. Bilateral retrograde pyelogram. EXAM: DG C-ARM 1-60 MIN CONTRAST:  Not provided FLUOROSCOPY TIME:  Fluoroscopy Time:  1 minute 9 seconds Radiation Exposure Index (if provided by the fluoroscopic device): 14.4 mGy Number of Acquired Spot Images: 6 COMPARISON:  CT urogram 09/08/2021 FINDINGS: Submitted intraoperative fluoroscopic images demonstrate balloon dilatation the distal right ureter. Right ureteral stent is seen. There is cannulation of the distal left ureter with retrograde opacification of the renal collecting system and ureter. IMPRESSION: Intraoperative images of left retrograde pyelogram and right ureteral dilation and stent placement as above. Electronically Signed   By: Miachel Roux M.D.   On: 09/27/2021 14:26    Assessment/Plan  Renal lithiasis rena lithiasis, recurrent UTI, urosepsis, treated by ID. 09/19/21 cystoscopy, laser lithotripsy, R ureteral stent/dilatation  GERD (gastroesophageal reflux disease) takes Pantoprazole 40mg  qd, Famotidine 20mg  qd. yearly FOBT per GI, Hgb 8.9 09/27/21  Parkinson's disease ([place]) No significant change in posture, gait, fine tremor in fingers, taking Sinemet.   Depression, psychotic ([place]) Depression/hallucination, stabilizing, takes Sertraline,  Quetiapine  Chronic constipation Stable,  takes Senokot S, Amitiza, Colace  Hypothyroidism akes Levothyroxine  70mcg qd. TSH 1.62 04/11/21  Erosive (osteo)arthritis takes Plaquenil, Tylenol  f/u Rheumatology.   Chronic diastolic CHF (congestive heart failure) (HCC) /trace edema BLE, takes Furosemide, cardiology, severe aortic valve stenosis. Echocardiogram 11/15/20 EF 60-65%. Bun/creat 14/0.85 09/27/21  Iron deficiency anemia  takes Fe, Hgb 11 04/11/21>>8.9 09/27/21, no apparent bleeding, baseline DOE and fatigue. Will update CBC/diff.  10/05/21 Hgb 8.0, repeat CBC/diff 10/09/21, UA C/S in am, observe for s/s of bleeding.   Aortic stenosis AS Cardiology palliative approach   Family/ staff Communication: plan of care reviewed with the patient and charge nurse.   Labs/tests ordered:  CBC/diff 10/05/21  Time spend 35 minutes.

## 2021-10-03 NOTE — Assessment & Plan Note (Signed)
akes Levothyroxine 70mcg qd. TSH 1.62 04/11/21

## 2021-10-03 NOTE — Assessment & Plan Note (Addendum)
takes Fe, Hgb 11 04/11/21>>8.9 09/27/21, no apparent bleeding, baseline DOE and fatigue. Will update CBC/diff.  10/05/21 Hgb 8.0, repeat CBC/diff 10/09/21, UA C/S in am, observe for s/s of bleeding.

## 2021-10-04 DIAGNOSIS — H353132 Nonexudative age-related macular degeneration, bilateral, intermediate dry stage: Secondary | ICD-10-CM | POA: Diagnosis not present

## 2021-10-04 DIAGNOSIS — Z79899 Other long term (current) drug therapy: Secondary | ICD-10-CM | POA: Diagnosis not present

## 2021-10-05 DIAGNOSIS — E785 Hyperlipidemia, unspecified: Secondary | ICD-10-CM | POA: Diagnosis not present

## 2021-10-05 DIAGNOSIS — D649 Anemia, unspecified: Secondary | ICD-10-CM | POA: Diagnosis not present

## 2021-10-05 DIAGNOSIS — R278 Other lack of coordination: Secondary | ICD-10-CM | POA: Diagnosis not present

## 2021-10-05 DIAGNOSIS — M25522 Pain in left elbow: Secondary | ICD-10-CM | POA: Diagnosis not present

## 2021-10-05 DIAGNOSIS — R29898 Other symptoms and signs involving the musculoskeletal system: Secondary | ICD-10-CM | POA: Diagnosis not present

## 2021-10-05 DIAGNOSIS — N3941 Urge incontinence: Secondary | ICD-10-CM | POA: Diagnosis not present

## 2021-10-05 DIAGNOSIS — I10 Essential (primary) hypertension: Secondary | ICD-10-CM | POA: Diagnosis not present

## 2021-10-05 DIAGNOSIS — M6281 Muscle weakness (generalized): Secondary | ICD-10-CM | POA: Diagnosis not present

## 2021-10-05 DIAGNOSIS — R2681 Unsteadiness on feet: Secondary | ICD-10-CM | POA: Diagnosis not present

## 2021-10-05 LAB — CBC AND DIFFERENTIAL
HCT: 26 — AB (ref 36–46)
Hemoglobin: 8 — AB (ref 12.0–16.0)
Neutrophils Absolute: 2739
Platelets: 253 (ref 150–399)
WBC: 5.1

## 2021-10-05 LAB — CBC: RBC: 2.73 — AB (ref 3.87–5.11)

## 2021-10-06 DIAGNOSIS — N390 Urinary tract infection, site not specified: Secondary | ICD-10-CM | POA: Diagnosis not present

## 2021-10-06 DIAGNOSIS — N399 Disorder of urinary system, unspecified: Secondary | ICD-10-CM | POA: Diagnosis not present

## 2021-10-09 DIAGNOSIS — R278 Other lack of coordination: Secondary | ICD-10-CM | POA: Diagnosis not present

## 2021-10-09 DIAGNOSIS — R2681 Unsteadiness on feet: Secondary | ICD-10-CM | POA: Diagnosis not present

## 2021-10-09 DIAGNOSIS — N3941 Urge incontinence: Secondary | ICD-10-CM | POA: Diagnosis not present

## 2021-10-09 DIAGNOSIS — I10 Essential (primary) hypertension: Secondary | ICD-10-CM | POA: Diagnosis not present

## 2021-10-09 DIAGNOSIS — R29898 Other symptoms and signs involving the musculoskeletal system: Secondary | ICD-10-CM | POA: Diagnosis not present

## 2021-10-09 DIAGNOSIS — M6281 Muscle weakness (generalized): Secondary | ICD-10-CM | POA: Diagnosis not present

## 2021-10-09 DIAGNOSIS — D649 Anemia, unspecified: Secondary | ICD-10-CM | POA: Diagnosis not present

## 2021-10-09 DIAGNOSIS — M25522 Pain in left elbow: Secondary | ICD-10-CM | POA: Diagnosis not present

## 2021-10-09 LAB — CBC AND DIFFERENTIAL
HCT: 26 — AB (ref 36–46)
Hemoglobin: 8 — AB (ref 12.0–16.0)
Neutrophils Absolute: 2538
Platelets: 281 (ref 150–399)
WBC: 5.4

## 2021-10-09 LAB — CBC: RBC: 2.77 — AB (ref 3.87–5.11)

## 2021-10-09 NOTE — Progress Notes (Signed)
Assessment/Plan:   1.  Parkinsons Disease  -Last visit, I was not convinced she had Parkinson's disease, but once I took her off of the medication, she certainly looks more parkinsonian today.  -Restart levodopa, but previously she was taking 2 tablets in the morning and 2 at bedtime.  This really does not make much physiologic sense.  We will start at 1 tablet at 7 AM/11 AM/4 PM and see how she does.  -She will continue off of the pramipexole given hallucinations.  She was also below the therapeutic starting dose.  2.  Hallucinations, likely with dementia  -Concerned that tramadol is contributing to these.  She is currently on scheduled tramadol, 3 times per day.  Patient and husband admit that her pain is actually very well controlled.  Patient states "now that I think about it, I get pain medication even when I am not in pain."  I am going to asked them to only give her tramadol as needed, as opposed to scheduled.  -Primary care is already prescribing quetiapine, 12.5 mg in the morning and 25 mg at bedtime.  -Patient in full-time SNF (husband in independent living at [facility])   Subjective:   [name] was seen today in follow up for Parkinsons disease.  My previous records were reviewed prior to todays visit as well as outside records available to me.  I saw the patient about 8 weeks ago.  Pt with husband who supplements the history.  She was diagnosed with Parkinson's disease in approximately 2011.  I was not convinced last visit that the patient had Parkinson's disease, but told her to hold her medication for today's visit, just to make sure that was not covering up symptoms.  I also weaned her off of the pramipexole because of hallucinations and delusions.  She did have a fall on September 27, being found on the bathroom floor by staff at the facility.  She was seen by the nurse practitioner 2 days later.  Pt was in the hospital just recently for nephrolithiasis, recurrent UTI,  urosepsis.  Husband states that she still sees her mom and dad in the middle of the day.  She starts the day off well but seems to happen as day goes on.  Pt states "well my parents are visiting right now."  She has a little bit of tremor but there may be days in between the tremor.  Husband states that she is very scared at bedtime.  He has a hard time leaving her at bedtime.  Current prescribed movement disorder medications: Pramipexole (discontinued after our last visit) Carbidopa/levodopa 25/100, 2 tablets twice per day (I asked her to hold those for today's visit)   PREVIOUS MEDICATIONS: Sinemet and Mirapex  ALLERGIES:   Allergies  Allergen Reactions   Adrenalone    Lactose Other (See Comments)    abd pain, lactose intolerant   Morphine Other (See Comments)   Sulfa Antibiotics Other (See Comments)    Headache, very sick   Codeine Other (See Comments)    Reaction:  Headaches and nightmares    Lactose Intolerance (Gi) Nausea And Vomiting   Latex Rash   Lyrica [Pregabalin] Swelling and Other (See Comments)    Reaction:  Leg swelling   Other Other (See Comments)    Pt states that pain medications give her nightmares.     Plaquenil [Hydroxychloroquine Sulfate] Other (See Comments)    Reaction:  GI upset    Reglan [Metoclopramide] Other (See Comments)  Reaction:  GI upset    Requip [Ropinirole Hcl] Other (See Comments)    Reaction:  GI upset    Septra [Sulfamethoxazole-Trimethoprim] Nausea And Vomiting   Shellfish Allergy Nausea And Vomiting    CURRENT MEDICATIONS:  Outpatient Encounter Medications as of 10/10/2021  Medication Sig   acetaminophen (TYLENOL) 500 MG tablet Take 500 mg by mouth See admin instructions. Take 1 tablet (500 mg) by mouth scheduled 3 times daily & as needed up to 3 times daily as needed for pain (Not to exceed 3g/24 hrs) PRN & Scheduled doses   Calcium Carb-Cholecalciferol (CALCIUM 600/VITAMIN D3) 600-800 MG-UNIT TABS Take 1 tablet by mouth daily.    carbidopa-levodopa (SINEMET IR) 25-100 MG tablet Take 2 tablets by mouth 2 (two) times daily.   Cholecalciferol (VITAMIN D) 50 MCG (2000 UT) tablet Take 2,000 Units by mouth daily.   docusate sodium (COLACE) 100 MG capsule Take 100 mg by mouth at bedtime.   famotidine (PEPCID) 20 MG tablet Take 20 mg by mouth daily.   Ferrous Sulfate (SLOW FE) 142 (45 Fe) MG TBCR Take 142 mg by mouth in the morning.   furosemide (LASIX) 20 MG tablet Take 1 tablet (20 mg total) by mouth daily. (Patient taking differently: Take 20 mg by mouth See admin instructions. Give 1 tablet (20 mg) by mouth scheduled in the morning & may repeat dose if needed for lower extremity swelling.)   hydroxychloroquine (PLAQUENIL) 200 MG tablet Take 200 mg by mouth daily.   levothyroxine (SYNTHROID, LEVOTHROID) 50 MCG tablet Take 50 mcg by mouth daily before breakfast.    lubiprostone (AMITIZA) 24 MCG capsule Take 24 mcg by mouth 2 (two) times daily with a meal.    Multiple Vitamin (MULTIVITAMIN WITH MINERALS) TABS tablet Take 1 tablet by mouth daily.   Multiple Vitamins-Minerals (PRESERVISION AREDS 2 PO) Take 1 tablet by mouth daily.   nystatin (MYCOSTATIN/NYSTOP) powder Apply topically 2 (two) times daily as needed. (Patient taking differently: Apply topically 2 (two) times daily as needed (fungal rash).)   pantoprazole (PROTONIX) 40 MG tablet Take 40 mg by mouth daily.   Polyethyl Glycol-Propyl Glycol (SYSTANE) 0.4-0.3 % SOLN Apply 1 drop to eye 2 (two) times daily as needed (dry eyes).    potassium citrate (UROCIT-K) 10 MEQ (1080 MG) SR tablet Take 10 mEq by mouth 2 (two) times daily.   pramipexole (MIRAPEX) 0.5 MG tablet Take 0.5 mg by mouth at bedtime.   QUEtiapine (SEROQUEL) 25 MG tablet Take 12.5-25 mg by mouth See admin instructions. Take 0.5 tablet (12.5 mg) by mouth in the morning & take 1 tablet (25 mg) by mouth in the evening.   rosuvastatin (CRESTOR) 20 MG tablet Take 20 mg by mouth daily.   saccharomyces boulardii  (FLORASTOR) 250 MG capsule Take 250 mg by mouth daily.   senna-docusate (SENOKOT-S) 8.6-50 MG tablet Take 1 tablet by mouth at bedtime.    sertraline (ZOLOFT) 50 MG tablet Take 100 mg by mouth at bedtime.   Skin Protectants, Misc. (EUCERIN) cream Apply 1 application topically daily.   Spacer/Aero-Holding Chambers (AEROCHAMBER MV) inhaler by Other route. Use as instructed as needed   traMADol (ULTRAM) 50 MG tablet Take 1 tablet (50 mg total) by mouth 3 (three) times daily. (Patient taking differently: Take 25 mg by mouth 3 (three) times daily.)   No facility-administered encounter medications on file as of 10/10/2021.    Objective:   PHYSICAL EXAMINATION:    VITALS:   Vitals:   10/10/21 1115  BP: Marland Kitchen)  116/58  Pulse: 95  SpO2: 97%  Weight: 129 lb (58.5 kg)    GEN:  The patient appears stated age and is in NAD. HEENT:  Normocephalic, atraumatic.  The mucous membranes are moist. The superficial temporal arteries are without ropiness or tenderness. CV:  RRR Lungs:  CTAB Neck/HEME:  There are no carotid bruits bilaterally.  Neurological examination:  Orientation: The patient is alert and oriented x3. Cranial nerves: There is good facial symmetry with facial hypomimia. The speech is fluent and clear. Soft palate rises symmetrically and there is no tongue deviation. Hearing is intact to conversational tone. Sensation: Sensation is intact to light touch throughout Motor: Strength is at least antigravity x4.  Movement examination: Tone: Patient has a lot of trouble relaxing for today's visit.  Appears that there may be some increased tone in the right upper extremity. Abnormal movements: None Coordination:  There is slowness with all rapid terminating movements and it appears some decremation as well, left greater than right. Gait and Station: The patient requires assistance out of the wheelchair.  She has retropulsion with arising (same as last visit).  The right foot sticks to the  ground and has some freezing.  Once she is out in the hallway, she does fairly well until she has to turn.  She turns en bloc.  She has some freezing in the turn.     I have reviewed and interpreted the following labs independently    Chemistry      Component Value Date/Time   NA 140 09/27/2021 0720   NA 140 04/12/2021 0005   K 4.3 09/27/2021 0720   CL 108 09/27/2021 0720   CL 110 07/24/2019 0000   CO2 24 09/27/2021 0720   CO2 24 07/24/2019 0000   BUN 14 09/27/2021 0720   BUN 18 04/12/2021 0005   CREATININE 0.85 09/27/2021 0720   CREATININE 0.65 05/26/2019 0906   GLU 87 04/12/2021 0005      Component Value Date/Time   CALCIUM 9.4 09/27/2021 0720   CALCIUM 9.0 07/24/2019 0000   ALKPHOS 78 04/12/2021 0005   AST 19 04/12/2021 0005   ALT 8 04/12/2021 0005   BILITOT 0.6 04/24/2020 0423   BILITOT 0.3 07/14/2019 0811       Lab Results  Component Value Date   WBC 5.0 09/27/2021   HGB 8.9 (L) 09/27/2021   HCT 29.4 (L) 09/27/2021   MCV 98.3 09/27/2021   PLT 307 09/27/2021    Lab Results  Component Value Date   TSH 1.62 04/12/2021     Total time spent on today's visit was 32 minutes, including both face-to-face time and nonface-to-face time.  Time included that spent on review of records (prior notes available to me/labs/imaging if pertinent), discussing treatment and goals, answering patient's questions and coordinating care.  Cc:  [name], MD

## 2021-10-10 ENCOUNTER — Other Ambulatory Visit: Payer: Self-pay

## 2021-10-10 ENCOUNTER — Ambulatory Visit (INDEPENDENT_AMBULATORY_CARE_PROVIDER_SITE_OTHER): Payer: Medicare Other | Admitting: Neurology

## 2021-10-10 VITALS — BP 116/58 | HR 95 | Wt 129.0 lb

## 2021-10-10 DIAGNOSIS — R441 Visual hallucinations: Secondary | ICD-10-CM

## 2021-10-10 DIAGNOSIS — G20 Parkinson's disease: Secondary | ICD-10-CM | POA: Diagnosis not present

## 2021-10-10 DIAGNOSIS — M25522 Pain in left elbow: Secondary | ICD-10-CM | POA: Diagnosis not present

## 2021-10-10 DIAGNOSIS — R296 Repeated falls: Secondary | ICD-10-CM | POA: Diagnosis not present

## 2021-10-10 DIAGNOSIS — R278 Other lack of coordination: Secondary | ICD-10-CM | POA: Diagnosis not present

## 2021-10-10 DIAGNOSIS — R2681 Unsteadiness on feet: Secondary | ICD-10-CM | POA: Diagnosis not present

## 2021-10-10 DIAGNOSIS — N3941 Urge incontinence: Secondary | ICD-10-CM | POA: Diagnosis not present

## 2021-10-10 DIAGNOSIS — M6281 Muscle weakness (generalized): Secondary | ICD-10-CM | POA: Diagnosis not present

## 2021-10-10 DIAGNOSIS — R29898 Other symptoms and signs involving the musculoskeletal system: Secondary | ICD-10-CM | POA: Diagnosis not present

## 2021-10-11 ENCOUNTER — Other Ambulatory Visit: Payer: Self-pay | Admitting: Orthopedic Surgery

## 2021-10-11 DIAGNOSIS — M6281 Muscle weakness (generalized): Secondary | ICD-10-CM | POA: Diagnosis not present

## 2021-10-11 DIAGNOSIS — R29898 Other symptoms and signs involving the musculoskeletal system: Secondary | ICD-10-CM | POA: Diagnosis not present

## 2021-10-11 DIAGNOSIS — N3941 Urge incontinence: Secondary | ICD-10-CM | POA: Diagnosis not present

## 2021-10-11 DIAGNOSIS — M154 Erosive (osteo)arthritis: Secondary | ICD-10-CM

## 2021-10-11 DIAGNOSIS — R278 Other lack of coordination: Secondary | ICD-10-CM | POA: Diagnosis not present

## 2021-10-11 DIAGNOSIS — R2681 Unsteadiness on feet: Secondary | ICD-10-CM | POA: Diagnosis not present

## 2021-10-11 DIAGNOSIS — M25522 Pain in left elbow: Secondary | ICD-10-CM | POA: Diagnosis not present

## 2021-10-11 MED ORDER — TRAMADOL HCL 50 MG PO TABS: 25.0000 mg | ORAL_TABLET | Freq: Three times a day (TID) | ORAL | 0 refills | Status: DC | PRN

## 2021-10-12 ENCOUNTER — Telehealth: Payer: Self-pay | Admitting: Neurology

## 2021-10-12 DIAGNOSIS — M25522 Pain in left elbow: Secondary | ICD-10-CM | POA: Diagnosis not present

## 2021-10-12 DIAGNOSIS — R29898 Other symptoms and signs involving the musculoskeletal system: Secondary | ICD-10-CM | POA: Diagnosis not present

## 2021-10-12 DIAGNOSIS — N3941 Urge incontinence: Secondary | ICD-10-CM | POA: Diagnosis not present

## 2021-10-12 DIAGNOSIS — M6281 Muscle weakness (generalized): Secondary | ICD-10-CM | POA: Diagnosis not present

## 2021-10-12 DIAGNOSIS — R278 Other lack of coordination: Secondary | ICD-10-CM | POA: Diagnosis not present

## 2021-10-12 DIAGNOSIS — R2681 Unsteadiness on feet: Secondary | ICD-10-CM | POA: Diagnosis not present

## 2021-10-12 NOTE — Telephone Encounter (Signed)
[facility] employee called in stating the pt has been complaining about restless leg symptoms returning. They would like to see if Mirapex could be reinstated?

## 2021-10-13 MED ORDER — CARBIDOPA-LEVODOPA ER 25-100 MG PO TBCR: 1.0000 | EXTENDED_RELEASE_TABLET | Freq: Every day | ORAL | 5 refills | Status: DC

## 2021-10-13 NOTE — Telephone Encounter (Signed)
Telephone call received from Tammy at [facility], Advised Tammy of Dr.Tat note.   Clarified patient taking Sinemet IR 25/100 as directed from patient visit 10/10/21.Also they have cut back on the Ultram to PRN, And stopped Mirapex.   Pt is to start Sinemet CR 25/100 QHS for her Restless leg. Per Dr.Tat.  Ordered printed and faxed to [place] at [phone].  Tammy read back the instructions for the Sinemet QHS until other wise stated to discontinue. Pt has a F/u 03/2022.

## 2021-10-14 DIAGNOSIS — N3941 Urge incontinence: Secondary | ICD-10-CM | POA: Diagnosis not present

## 2021-10-14 DIAGNOSIS — M25522 Pain in left elbow: Secondary | ICD-10-CM | POA: Diagnosis not present

## 2021-10-14 DIAGNOSIS — R29898 Other symptoms and signs involving the musculoskeletal system: Secondary | ICD-10-CM | POA: Diagnosis not present

## 2021-10-14 DIAGNOSIS — R2681 Unsteadiness on feet: Secondary | ICD-10-CM | POA: Diagnosis not present

## 2021-10-14 DIAGNOSIS — M6281 Muscle weakness (generalized): Secondary | ICD-10-CM | POA: Diagnosis not present

## 2021-10-14 DIAGNOSIS — R278 Other lack of coordination: Secondary | ICD-10-CM | POA: Diagnosis not present

## 2021-10-16 DIAGNOSIS — M25522 Pain in left elbow: Secondary | ICD-10-CM | POA: Diagnosis not present

## 2021-10-16 DIAGNOSIS — N3941 Urge incontinence: Secondary | ICD-10-CM | POA: Diagnosis not present

## 2021-10-16 DIAGNOSIS — M6281 Muscle weakness (generalized): Secondary | ICD-10-CM | POA: Diagnosis not present

## 2021-10-16 DIAGNOSIS — R2681 Unsteadiness on feet: Secondary | ICD-10-CM | POA: Diagnosis not present

## 2021-10-16 DIAGNOSIS — R29898 Other symptoms and signs involving the musculoskeletal system: Secondary | ICD-10-CM | POA: Diagnosis not present

## 2021-10-16 DIAGNOSIS — R278 Other lack of coordination: Secondary | ICD-10-CM | POA: Diagnosis not present

## 2021-10-17 DIAGNOSIS — M25522 Pain in left elbow: Secondary | ICD-10-CM | POA: Diagnosis not present

## 2021-10-17 DIAGNOSIS — M6281 Muscle weakness (generalized): Secondary | ICD-10-CM | POA: Diagnosis not present

## 2021-10-17 DIAGNOSIS — N3941 Urge incontinence: Secondary | ICD-10-CM | POA: Diagnosis not present

## 2021-10-17 DIAGNOSIS — R2681 Unsteadiness on feet: Secondary | ICD-10-CM | POA: Diagnosis not present

## 2021-10-17 DIAGNOSIS — R29898 Other symptoms and signs involving the musculoskeletal system: Secondary | ICD-10-CM | POA: Diagnosis not present

## 2021-10-17 DIAGNOSIS — R278 Other lack of coordination: Secondary | ICD-10-CM | POA: Diagnosis not present

## 2021-10-18 ENCOUNTER — Telehealth: Payer: Self-pay | Admitting: Neurology

## 2021-10-18 DIAGNOSIS — N3941 Urge incontinence: Secondary | ICD-10-CM | POA: Diagnosis not present

## 2021-10-18 DIAGNOSIS — R278 Other lack of coordination: Secondary | ICD-10-CM | POA: Diagnosis not present

## 2021-10-18 DIAGNOSIS — M6281 Muscle weakness (generalized): Secondary | ICD-10-CM | POA: Diagnosis not present

## 2021-10-18 DIAGNOSIS — R29898 Other symptoms and signs involving the musculoskeletal system: Secondary | ICD-10-CM | POA: Diagnosis not present

## 2021-10-18 DIAGNOSIS — M25522 Pain in left elbow: Secondary | ICD-10-CM | POA: Diagnosis not present

## 2021-10-18 DIAGNOSIS — R2681 Unsteadiness on feet: Secondary | ICD-10-CM | POA: Diagnosis not present

## 2021-10-18 NOTE — Telephone Encounter (Signed)
Speaking with pt husband he stated that pt started Sinemet at night 2 days ago for restless leg after stopping tramadol and pramipexola he is asking if the extra sinemet is causing her tremors or if stopping the pramipexole is that cause

## 2021-10-18 NOTE — Telephone Encounter (Signed)
[facility] called transferred to the nurse phone rang for 10 mins with no answer will try and call again

## 2021-10-18 NOTE — Telephone Encounter (Signed)
[facility] called transferred to the nurse phone rang for 8 mins with no answer will try and call again

## 2021-10-19 DIAGNOSIS — N201 Calculus of ureter: Secondary | ICD-10-CM | POA: Diagnosis not present

## 2021-10-19 NOTE — Telephone Encounter (Signed)
[facility] called transferred to the nurse phone rang for 5 mins with no answer will try and call again

## 2021-10-20 DIAGNOSIS — M6281 Muscle weakness (generalized): Secondary | ICD-10-CM | POA: Diagnosis not present

## 2021-10-20 DIAGNOSIS — R29898 Other symptoms and signs involving the musculoskeletal system: Secondary | ICD-10-CM | POA: Diagnosis not present

## 2021-10-20 DIAGNOSIS — R2681 Unsteadiness on feet: Secondary | ICD-10-CM | POA: Diagnosis not present

## 2021-10-20 DIAGNOSIS — R278 Other lack of coordination: Secondary | ICD-10-CM | POA: Diagnosis not present

## 2021-10-20 DIAGNOSIS — N3941 Urge incontinence: Secondary | ICD-10-CM | POA: Diagnosis not present

## 2021-10-20 DIAGNOSIS — M25522 Pain in left elbow: Secondary | ICD-10-CM | POA: Diagnosis not present

## 2021-10-20 NOTE — Telephone Encounter (Signed)
[facility] called back twice with no answer will try again later

## 2021-10-20 NOTE — Telephone Encounter (Signed)
I was able to speak with the nurse at [facility] she stated that [name] has been complaining of RLS a lot after her change in medication they have not noticed her legs jumping until about an hour ago but they are not sure if that was voluntary or involuntary , She keeps asking [facility] for her mirepe

## 2021-10-20 NOTE — Telephone Encounter (Signed)
I was able to speak with the nurse at [facility] she stated that [name] has been complaining of RLS a lot after her change in medication they have not noticed her legs jumping until about an hour ago it jumped once but they are not sure if that was voluntary or involuntary , She keeps asking [facility] for her Mirapex that was DC. Number for [facility] is [phone] ext [phone]

## 2021-10-23 DIAGNOSIS — R29898 Other symptoms and signs involving the musculoskeletal system: Secondary | ICD-10-CM | POA: Diagnosis not present

## 2021-10-23 DIAGNOSIS — M25522 Pain in left elbow: Secondary | ICD-10-CM | POA: Diagnosis not present

## 2021-10-23 DIAGNOSIS — R278 Other lack of coordination: Secondary | ICD-10-CM | POA: Diagnosis not present

## 2021-10-23 DIAGNOSIS — R2681 Unsteadiness on feet: Secondary | ICD-10-CM | POA: Diagnosis not present

## 2021-10-23 DIAGNOSIS — N3941 Urge incontinence: Secondary | ICD-10-CM | POA: Diagnosis not present

## 2021-10-23 DIAGNOSIS — M6281 Muscle weakness (generalized): Secondary | ICD-10-CM | POA: Diagnosis not present

## 2021-10-23 MED ORDER — PRAMIPEXOLE DIHYDROCHLORIDE 0.125 MG PO TABS: 0.1250 mg | ORAL_TABLET | Freq: Every day | ORAL | 2 refills | Status: DC

## 2021-10-23 NOTE — Telephone Encounter (Signed)
Spoke with Deja at [facility] informed her Dr Tat will start back a small dose of pramipexole back - 0.125 mg q hs will fax the order to [phone]

## 2021-10-24 DIAGNOSIS — M25522 Pain in left elbow: Secondary | ICD-10-CM | POA: Diagnosis not present

## 2021-10-24 DIAGNOSIS — R29898 Other symptoms and signs involving the musculoskeletal system: Secondary | ICD-10-CM | POA: Diagnosis not present

## 2021-10-24 DIAGNOSIS — R278 Other lack of coordination: Secondary | ICD-10-CM | POA: Diagnosis not present

## 2021-10-24 DIAGNOSIS — N3941 Urge incontinence: Secondary | ICD-10-CM | POA: Diagnosis not present

## 2021-10-24 DIAGNOSIS — M6281 Muscle weakness (generalized): Secondary | ICD-10-CM | POA: Diagnosis not present

## 2021-10-24 DIAGNOSIS — R2681 Unsteadiness on feet: Secondary | ICD-10-CM | POA: Diagnosis not present

## 2021-10-25 DIAGNOSIS — R29898 Other symptoms and signs involving the musculoskeletal system: Secondary | ICD-10-CM | POA: Diagnosis not present

## 2021-10-25 DIAGNOSIS — R278 Other lack of coordination: Secondary | ICD-10-CM | POA: Diagnosis not present

## 2021-10-25 DIAGNOSIS — N3941 Urge incontinence: Secondary | ICD-10-CM | POA: Diagnosis not present

## 2021-10-25 DIAGNOSIS — M25522 Pain in left elbow: Secondary | ICD-10-CM | POA: Diagnosis not present

## 2021-10-25 DIAGNOSIS — M6281 Muscle weakness (generalized): Secondary | ICD-10-CM | POA: Diagnosis not present

## 2021-10-25 DIAGNOSIS — R2681 Unsteadiness on feet: Secondary | ICD-10-CM | POA: Diagnosis not present

## 2021-10-27 DIAGNOSIS — R278 Other lack of coordination: Secondary | ICD-10-CM | POA: Diagnosis not present

## 2021-10-27 DIAGNOSIS — M25522 Pain in left elbow: Secondary | ICD-10-CM | POA: Diagnosis not present

## 2021-10-27 DIAGNOSIS — R29898 Other symptoms and signs involving the musculoskeletal system: Secondary | ICD-10-CM | POA: Diagnosis not present

## 2021-10-27 DIAGNOSIS — R2681 Unsteadiness on feet: Secondary | ICD-10-CM | POA: Diagnosis not present

## 2021-10-27 DIAGNOSIS — M6281 Muscle weakness (generalized): Secondary | ICD-10-CM | POA: Diagnosis not present

## 2021-10-27 DIAGNOSIS — N3941 Urge incontinence: Secondary | ICD-10-CM | POA: Diagnosis not present

## 2021-10-29 DIAGNOSIS — R29898 Other symptoms and signs involving the musculoskeletal system: Secondary | ICD-10-CM | POA: Diagnosis not present

## 2021-10-29 DIAGNOSIS — M25522 Pain in left elbow: Secondary | ICD-10-CM | POA: Diagnosis not present

## 2021-10-29 DIAGNOSIS — R278 Other lack of coordination: Secondary | ICD-10-CM | POA: Diagnosis not present

## 2021-10-29 DIAGNOSIS — M6281 Muscle weakness (generalized): Secondary | ICD-10-CM | POA: Diagnosis not present

## 2021-10-29 DIAGNOSIS — R2681 Unsteadiness on feet: Secondary | ICD-10-CM | POA: Diagnosis not present

## 2021-10-29 DIAGNOSIS — N3941 Urge incontinence: Secondary | ICD-10-CM | POA: Diagnosis not present

## 2021-10-30 DIAGNOSIS — N3941 Urge incontinence: Secondary | ICD-10-CM | POA: Diagnosis not present

## 2021-10-30 DIAGNOSIS — M6281 Muscle weakness (generalized): Secondary | ICD-10-CM | POA: Diagnosis not present

## 2021-10-30 DIAGNOSIS — R2681 Unsteadiness on feet: Secondary | ICD-10-CM | POA: Diagnosis not present

## 2021-10-30 DIAGNOSIS — R29898 Other symptoms and signs involving the musculoskeletal system: Secondary | ICD-10-CM | POA: Diagnosis not present

## 2021-10-30 DIAGNOSIS — R278 Other lack of coordination: Secondary | ICD-10-CM | POA: Diagnosis not present

## 2021-10-30 DIAGNOSIS — M25522 Pain in left elbow: Secondary | ICD-10-CM | POA: Diagnosis not present

## 2021-10-31 DIAGNOSIS — M6281 Muscle weakness (generalized): Secondary | ICD-10-CM | POA: Diagnosis not present

## 2021-10-31 DIAGNOSIS — R2681 Unsteadiness on feet: Secondary | ICD-10-CM | POA: Diagnosis not present

## 2021-10-31 DIAGNOSIS — L570 Actinic keratosis: Secondary | ICD-10-CM | POA: Diagnosis not present

## 2021-10-31 DIAGNOSIS — L814 Other melanin hyperpigmentation: Secondary | ICD-10-CM | POA: Diagnosis not present

## 2021-10-31 DIAGNOSIS — N3941 Urge incontinence: Secondary | ICD-10-CM | POA: Diagnosis not present

## 2021-10-31 DIAGNOSIS — R29898 Other symptoms and signs involving the musculoskeletal system: Secondary | ICD-10-CM | POA: Diagnosis not present

## 2021-10-31 DIAGNOSIS — R278 Other lack of coordination: Secondary | ICD-10-CM | POA: Diagnosis not present

## 2021-10-31 DIAGNOSIS — Z85068 Personal history of other malignant neoplasm of small intestine: Secondary | ICD-10-CM | POA: Diagnosis not present

## 2021-10-31 DIAGNOSIS — M25522 Pain in left elbow: Secondary | ICD-10-CM | POA: Diagnosis not present

## 2021-10-31 DIAGNOSIS — L821 Other seborrheic keratosis: Secondary | ICD-10-CM | POA: Diagnosis not present

## 2021-11-01 DIAGNOSIS — M25522 Pain in left elbow: Secondary | ICD-10-CM | POA: Diagnosis not present

## 2021-11-01 DIAGNOSIS — R2681 Unsteadiness on feet: Secondary | ICD-10-CM | POA: Diagnosis not present

## 2021-11-01 DIAGNOSIS — R278 Other lack of coordination: Secondary | ICD-10-CM | POA: Diagnosis not present

## 2021-11-01 DIAGNOSIS — N3941 Urge incontinence: Secondary | ICD-10-CM | POA: Diagnosis not present

## 2021-11-01 DIAGNOSIS — R29898 Other symptoms and signs involving the musculoskeletal system: Secondary | ICD-10-CM | POA: Diagnosis not present

## 2021-11-01 DIAGNOSIS — M6281 Muscle weakness (generalized): Secondary | ICD-10-CM | POA: Diagnosis not present

## 2021-11-03 DIAGNOSIS — N3941 Urge incontinence: Secondary | ICD-10-CM | POA: Diagnosis not present

## 2021-11-03 DIAGNOSIS — M6281 Muscle weakness (generalized): Secondary | ICD-10-CM | POA: Diagnosis not present

## 2021-11-03 DIAGNOSIS — R2681 Unsteadiness on feet: Secondary | ICD-10-CM | POA: Diagnosis not present

## 2021-11-03 DIAGNOSIS — R278 Other lack of coordination: Secondary | ICD-10-CM | POA: Diagnosis not present

## 2021-11-03 DIAGNOSIS — R29898 Other symptoms and signs involving the musculoskeletal system: Secondary | ICD-10-CM | POA: Diagnosis not present

## 2021-11-03 DIAGNOSIS — M25522 Pain in left elbow: Secondary | ICD-10-CM | POA: Diagnosis not present

## 2021-11-06 ENCOUNTER — Non-Acute Institutional Stay (SKILLED_NURSING_FACILITY): Payer: Medicare Other | Admitting: Nurse Practitioner

## 2021-11-06 ENCOUNTER — Encounter: Payer: Self-pay | Admitting: Nurse Practitioner

## 2021-11-06 DIAGNOSIS — F323 Major depressive disorder, single episode, severe with psychotic features: Secondary | ICD-10-CM

## 2021-11-06 DIAGNOSIS — I5032 Chronic diastolic (congestive) heart failure: Secondary | ICD-10-CM | POA: Diagnosis not present

## 2021-11-06 DIAGNOSIS — M25522 Pain in left elbow: Secondary | ICD-10-CM | POA: Diagnosis not present

## 2021-11-06 DIAGNOSIS — I350 Nonrheumatic aortic (valve) stenosis: Secondary | ICD-10-CM

## 2021-11-06 DIAGNOSIS — M6281 Muscle weakness (generalized): Secondary | ICD-10-CM | POA: Diagnosis not present

## 2021-11-06 DIAGNOSIS — K219 Gastro-esophageal reflux disease without esophagitis: Secondary | ICD-10-CM | POA: Diagnosis not present

## 2021-11-06 DIAGNOSIS — G20 Parkinson's disease: Secondary | ICD-10-CM | POA: Diagnosis not present

## 2021-11-06 DIAGNOSIS — N3941 Urge incontinence: Secondary | ICD-10-CM | POA: Diagnosis not present

## 2021-11-06 DIAGNOSIS — R29898 Other symptoms and signs involving the musculoskeletal system: Secondary | ICD-10-CM | POA: Diagnosis not present

## 2021-11-06 DIAGNOSIS — D508 Other iron deficiency anemias: Secondary | ICD-10-CM

## 2021-11-06 DIAGNOSIS — N200 Calculus of kidney: Secondary | ICD-10-CM | POA: Diagnosis not present

## 2021-11-06 DIAGNOSIS — R2681 Unsteadiness on feet: Secondary | ICD-10-CM | POA: Diagnosis not present

## 2021-11-06 DIAGNOSIS — R278 Other lack of coordination: Secondary | ICD-10-CM | POA: Diagnosis not present

## 2021-11-06 DIAGNOSIS — M79602 Pain in left arm: Secondary | ICD-10-CM | POA: Diagnosis not present

## 2021-11-06 DIAGNOSIS — G2581 Restless legs syndrome: Secondary | ICD-10-CM | POA: Diagnosis not present

## 2021-11-06 DIAGNOSIS — M154 Erosive (osteo)arthritis: Secondary | ICD-10-CM

## 2021-11-06 DIAGNOSIS — G20A1 Parkinson's disease without dyskinesia, without mention of fluctuations: Secondary | ICD-10-CM

## 2021-11-06 NOTE — Assessment & Plan Note (Signed)
palliative approach

## 2021-11-06 NOTE — Assessment & Plan Note (Signed)
takes Plaquenil, Tylenol  f/u Rheumatology.

## 2021-11-06 NOTE — Assessment & Plan Note (Signed)
takes Fe, Hgb 11 04/11/21>>8.9 09/27/21

## 2021-11-06 NOTE — Assessment & Plan Note (Signed)
takes Pantoprazole 40mg  qd, Famotidine 20mg  qd. yearly FOBT per GI, Hgb 8.9 09/27/21

## 2021-11-06 NOTE — Assessment & Plan Note (Signed)
started MiraPex 0.125mg  10/18/21, also takes Sinemet hs

## 2021-11-06 NOTE — Progress Notes (Signed)
Location:   SNF [place] Room Number: 45 Place of Service:  SNF (31) Provider: [facility] Gwendolin Briel NP  [name], MD  Patient Care Team: [name], MD as PCP - General (Internal Medicine) [name], MD as PCP - Cardiology (Cardiology) [name], MD (Neurosurgery) [name], MD (Neurology) [name], MD (Inactive) as Consulting Physician (Gastroenterology) [name], MD as Consulting Physician (Urology) Yvetta Drotar X, NP as Nurse Practitioner (Internal Medicine) Tat, Eustace Quail, DO as Consulting Physician (Neurology)  Extended Emergency Contact Information Primary Emergency Contact: [facility] Address: [address]          [place], [place] [address] [name] of [place] Phone: [phone] Mobile Phone: [phone] Relation: Spouse Secondary Emergency Contact: [name] Mobile Phone: [phone] Relation: Daughter Preferred language: [name] Interpreter needed? No  Code Status:  DNR Goals of care: Advanced Directive information Advanced Directives 10/10/2021  Does Patient Have a Medical Advance Directive? Yes  Type of Advance Directive -  Does patient want to make changes to medical advance directive? -  Copy of [place] in Chart? -  Would patient like information on creating a medical advance directive? -  Pre-existing out of facility DNR order (yellow form or pink MOST form) -     Chief Complaint  Patient presents with   Medical Management of Chronic Issues    HPI:  Pt is a 87 y.o. female seen today for medical management of chronic diseases.      Hx of cosed fracture of multiple ribs of left side with routine healing, takes prn Tramadol, pain is better controlled.             Hx of rena lithiasis, recurrent UTI, urosepsis, treated by ID. 09/19/21 cystoscopy, laser lithotripsy, R ureteral stent/dilatation. Resolved dysuria, decreased urinary frequency.              GERD,  takes Pantoprazole 40mg  qd, Famotidine 20mg  qd. yearly FOBT per GI, Hgb 8.9 09/27/21             Parkinson's, takes Sinemet, on MiraPex 0.125mg  qhs since 10/18/21  RLS started MiraPex 0.125mg  10/18/21, also takes Sinemet hs, better sleep at night.              Depression/hallucination, stabilizing, takes Sertraline,  Quetiapine. Change Tramadol to prn helped.              Constipation, takes Senokot S, Amitiza, Colace             Hypothyroidism, takes Levothyroxine 28mcg qd. TSH 1.62 04/11/21             Erosive arthritis, takes Plaquenil, Tylenol  f/u Rheumatology.              CHF/trace edema BLE, takes Furosemide, cardiology, severe aortic valve stenosis. Echocardiogram 11/15/20 EF 60-65%. Bun/creat 14/0.85 09/27/21             Anemia, takes Fe, Hgb 11 04/11/21>>8.9 09/27/21             AS Cardiology palliative approach Past Medical History:  Diagnosis Date   Abnormal nuclear stress test    Actinic cheilitis 04/23/2019   Anemia    iron deficiency    Anxiety    Aortic stenosis    severe by echo 2021    Arthritis    Broken arm    right    Chronic diastolic CHF (congestive heart failure) (HCC)    Chronic pain  Cognitive communication deficit    Cognitive deficits    Communication   Constipation    Depression    Erosive (osteo)arthritis 03/09/2020   Family history of adverse reaction to anesthesia    pt. states sister vomits   Fibromyalgia    GERD (gastroesophageal reflux disease)    H/O hiatal hernia    Hallucinations, visual 07/21/2018   03/10/20 psych consult.    Hematuria 05/18/2020   History of bronchitis    History of kidney stones    Hypercholesterolemia    Hypertension    dr t turner   Hypothyroidism    Memory loss 04/21/2018   08/08/18 CT head no traumatic findings, atrophy.  09/27/19 MMSE 25/30, failed the clock drawing   Microhematuria 03/24/2015   Neuropathy    Osteoporosis    Parkinson disease (HCC)    PVD (peripheral vascular disease) (HCC)    99% stenosis of  left anteiror tibial artery, mod stenosis of left distal SFA and popliteal artery followed by [name]   RBBB    noted on EKG 2018   Restless leg syndrome    Sepsis ([place])    hx of due to Ecoli   Septic shock ([place]) 04/19/2020   Shortness of breath    with exertion - chronic    Sjogren's disease ([place])    Sjogren's syndrome ([place]) 02/04/2018   04/16/19 rheumatology: Erosive OA of hands, Sjogrens syndrome, low back pain at multiple sites, recurrent kidney stones, age related osteoporosis w/o current pathological fracture, f/u 6 months.    SOB (shortness of breath)    chronic due to diastolic dysfunction, deconditioning, obesity   Spinal stenosis    lumbar region    Tremor    Unstable gait 04/21/2018   Unsteadiness on feet    Urinary frequency 09/18/2018   Urinary tract infection    Visual hallucinations    Past Surgical History:  Procedure Laterality Date   ABDOMINAL HYSTERECTOMY     BACK SURGERY     4 back surgeries,   lumbar fusion   CARDIAC CATHETERIZATION  2006   normal   CYSTOSCOPY/URETEROSCOPY/HOLMIUM LASER/STENT PLACEMENT Left 01/06/2019   Procedure: CYSTOSCOPY/RETROGRADE/URETEROSCOPY/HOLMIUM LASER/BASKET RETRIEVAL/STENT PLACEMENT;  Surgeon: Ceasar Mons, MD;  Location: [facility];  Service: Urology;  Laterality: Left;   CYSTOSCOPY/URETEROSCOPY/HOLMIUM LASER/STENT PLACEMENT Bilateral 05/24/2020   Procedure: CYSTOSCOPY/RETROGRADE/URETEROSCOPY/HOLMIUM LASER/STENT PLACEMENT;  Surgeon: Ceasar Mons, MD;  Location: [facility];  Service: Urology;  Laterality: Bilateral;   CYSTOSCOPY/URETEROSCOPY/HOLMIUM LASER/STENT PLACEMENT Right 09/27/2021   Procedure: CYSTOSCOPY/BILATERAL RETROGRADE/URETEROSCOPY/HOLMIUM LASER/STENT PLACEMENT;  Surgeon: Ceasar Mons, MD;  Location: [facility];  Service: Urology;  Laterality: Right;  ONLY NEEDS 60 MIN   EYE SURGERY Bilateral    cateracts   falls     variious fall, broken wrist,and toes   FRACTURE SURGERY Right April 2016    Wrist, Pt. fell   IR NEPHROSTOMY PLACEMENT RIGHT  04/19/2020   kidney stone removal Left 01/20/2019   RIGHT/LEFT HEART CATH AND CORONARY ANGIOGRAPHY N/A 02/05/2018   Procedure: RIGHT/LEFT HEART CATH AND CORONARY ANGIOGRAPHY;  Surgeon: Troy Sine, MD;  Location: [facility];  Service: Cardiovascular;  Laterality: N/A;   SPINAL CORD STIMULATOR INSERTION N/A 09/09/2015   Procedure: LUMBAR SPINAL CORD STIMULATOR INSERTION;  Surgeon: Clydell Hakim, MD;  Location: [facility];  Service: Neurosurgery;  Laterality: N/A;  LUMBAR SPINAL CORD STIMULATOR INSERTION   SPINE SURGERY  April 2013   Back X's 4   TOTAL HIP ARTHROPLASTY     right  WRIST FRACTURE SURGERY Bilateral     Allergies  Allergen Reactions   Adrenalone    Lactose Other (See Comments)    abd pain, lactose intolerant   Morphine Other (See Comments)   Sulfa Antibiotics Other (See Comments)    Headache, very sick   Codeine Other (See Comments)    Reaction:  Headaches and nightmares    Lactose Intolerance (Gi) Nausea And Vomiting   Latex Rash   Lyrica [Pregabalin] Swelling and Other (See Comments)    Reaction:  Leg swelling   Other Other (See Comments)    Pt states that pain medications give her nightmares.     Plaquenil [Hydroxychloroquine Sulfate] Other (See Comments)    Reaction:  GI upset    Reglan [Metoclopramide] Other (See Comments)    Reaction:  GI upset    Requip [Ropinirole Hcl] Other (See Comments)    Reaction:  GI upset    Septra [Sulfamethoxazole-Trimethoprim] Nausea And Vomiting   Shellfish Allergy Nausea And Vomiting    Allergies as of 11/06/2021       Reactions   Adrenalone    Lactose Other (See Comments)   abd pain, lactose intolerant   Morphine Other (See Comments)   Sulfa Antibiotics Other (See Comments)   Headache, very sick   Codeine Other (See Comments)   Reaction:  Headaches and nightmares    Lactose Intolerance (gi) Nausea And Vomiting   Latex Rash   Lyrica [pregabalin]  Swelling, Other (See Comments)   Reaction:  Leg swelling   Other Other (See Comments)   Pt states that pain medications give her nightmares.     Plaquenil [hydroxychloroquine Sulfate] Other (See Comments)   Reaction:  GI upset    Reglan [metoclopramide] Other (See Comments)   Reaction:  GI upset    Requip [ropinirole Hcl] Other (See Comments)   Reaction:  GI upset    Septra [sulfamethoxazole-trimethoprim] Nausea And Vomiting   Shellfish Allergy Nausea And Vomiting        Medication List        Accurate as of November 06, 2021 11:59 PM. If you have any questions, ask your nurse or doctor.          acetaminophen 500 MG tablet Commonly known as: TYLENOL Take 500 mg by mouth See admin instructions. Take 1 tablet (500 mg) by mouth scheduled 3 times daily & as needed up to 3 times daily as needed for pain (Not to exceed 3g/24 hrs) PRN & Scheduled doses   AeroChamber MV inhaler by Other route. Use as instructed as needed   Calcium 600/Vitamin D3 600-20 MG-MCG Tabs Generic drug: Calcium Carb-Cholecalciferol Take 1 tablet by mouth daily.   carbidopa-levodopa 25-100 MG tablet Commonly known as: SINEMET IR Take 2 tablets by mouth 2 (two) times daily.   Carbidopa-Levodopa ER 25-100 MG tablet controlled release Commonly known as: SINEMET CR Take 1 tablet by mouth at bedtime.   docusate sodium 100 MG capsule Commonly known as: COLACE Take 100 mg by mouth at bedtime.   eucerin cream Apply 1 application topically daily.   famotidine 20 MG tablet Commonly known as: PEPCID Take 20 mg by mouth daily.   furosemide 20 MG tablet Commonly known as: LASIX Take 1 tablet (20 mg total) by mouth daily. What changed:  when to take this additional instructions   hydroxychloroquine 200 MG tablet Commonly known as: PLAQUENIL Take 200 mg by mouth daily.   levothyroxine 50 MCG tablet Commonly known as: SYNTHROID Take 50 mcg by  mouth daily before breakfast.   lubiprostone 24 MCG  capsule Commonly known as: AMITIZA Take 24 mcg by mouth 2 (two) times daily with a meal.   multivitamin with minerals Tabs tablet Take 1 tablet by mouth daily.   nystatin powder Commonly known as: MYCOSTATIN/NYSTOP Apply topically 2 (two) times daily as needed. What changed: reasons to take this   pantoprazole 40 MG tablet Commonly known as: PROTONIX Take 40 mg by mouth daily.   potassium citrate 10 MEQ (1080 MG) SR tablet Commonly known as: UROCIT-K Take 10 mEq by mouth 2 (two) times daily.   pramipexole 0.125 MG tablet Commonly known as: MIRAPEX Take 1 tablet (0.125 mg total) by mouth at bedtime.   PRESERVISION AREDS 2 PO Take 1 tablet by mouth daily.   QUEtiapine 25 MG tablet Commonly known as: SEROQUEL Take 12.5-25 mg by mouth See admin instructions. Take 0.5 tablet (12.5 mg) by mouth in the morning & take 1 tablet (25 mg) by mouth in the evening.   rosuvastatin 20 MG tablet Commonly known as: CRESTOR Take 20 mg by mouth daily.   saccharomyces boulardii 250 MG capsule Commonly known as: FLORASTOR Take 250 mg by mouth daily.   senna-docusate 8.6-50 MG tablet Commonly known as: Senokot-S Take 1 tablet by mouth at bedtime.   sertraline 50 MG tablet Commonly known as: ZOLOFT Take 100 mg by mouth at bedtime.   Slow Fe 142 (45 Fe) MG Tbcr Generic drug: Ferrous Sulfate Take 142 mg by mouth in the morning.   Systane 0.4-0.3 % Soln Generic drug: Polyethyl Glycol-Propyl Glycol Apply 1 drop to eye 2 (two) times daily as needed (dry eyes).   traMADol 50 MG tablet Commonly known as: ULTRAM Take 0.5 tablets (25 mg total) by mouth 3 (three) times daily as needed.   Vitamin D 50 MCG (2000 UT) tablet Take 2,000 Units by mouth daily.        Review of Systems  Constitutional:  Positive for fatigue. Negative for fever and unexpected weight change.  HENT:  Positive for hearing loss. Negative for congestion and voice change.   Eyes:  Negative for visual disturbance.   Respiratory:  Positive for shortness of breath. Negative for cough.        Chronic DOE  Cardiovascular:  Positive for leg swelling.  Gastrointestinal:  Negative for abdominal pain, constipation, nausea and vomiting.  Genitourinary:  Positive for frequency. Negative for dysuria, hematuria and urgency.  Musculoskeletal:  Positive for arthralgias, back pain, gait problem and myalgias.       Chronic lower back pain. The R 3rd finger mid knuckle pain is chronic.   Skin:  Negative for color change.  Neurological:  Positive for tremors. Negative for speech difficulty, weakness and light-headedness.       Memory lapses. Minimal resting tremor in fingers, not disabling. RLS  Psychiatric/Behavioral:  Positive for hallucinations. Negative for behavioral problems and sleep disturbance. The patient is not nervous/anxious.    Immunization History  Administered Date(s) Administered   Hepatitis A, Adult 03/01/1997, 09/20/1997   Influenza Split 09/09/2013, 09/27/2014   Influenza, High Dose Seasonal PF 08/01/2011, 08/27/2012, 10/14/2013, 09/08/2015, 09/10/2016, 09/12/2017, 09/12/2018, 09/15/2019   Influenza,inj,Quad PF,6+ Mos 09/11/2016   Influenza-Unspecified 09/21/2020   Moderna Sars-Covid-2 Vaccination 12/12/2019, 01/09/2020, 10/18/2020, 05/09/2021, 08/29/2021   Pneumococcal Conjugate-13 04/15/2014   Pneumococcal Polysaccharide-23 09/28/2005   Td 06/20/2016   Tdap 04/29/2006, 09/01/2015   Zoster Recombinat (Shingrix) 06/11/2018, 09/10/2018   Zoster, Live 04/29/2006   Pertinent  Health Maintenance Due  Topic Date Due   INFLUENZA VACCINE  07/10/2021   DEXA SCAN  Completed   Fall Risk 08/03/2021 08/04/2021 08/09/2021 09/27/2021 10/10/2021  Falls in the past year? 1 1 1  - 1  Was there an injury with Fall? 1 1 1  - 0  Was there an injury with Fall? - - - - -  Fall Risk Category Calculator 2 3 2  - 2  Fall Risk Category Moderate High Moderate - Moderate  Patient Fall Risk Level - High fall risk High  fall risk High fall risk Moderate fall risk  Patient at Risk for Falls Due to - - History of fall(s) - -  Patient at Risk for Falls Due to - - - - -  Fall risk Follow up - - Falls evaluation completed;Education provided - -   Functional Status Survey:    Vitals:   11/06/21 1525  BP: 110/62  Pulse: 70  Resp: 18  Temp: 99.2 F (37.3 C)  SpO2: 95%   There is no height or weight on file to calculate BMI. Physical Exam Vitals and nursing note reviewed.  Constitutional:      Appearance: Normal appearance.  HENT:     Head: Normocephalic and atraumatic.     Mouth/Throat:     Mouth: Mucous membranes are moist.  Eyes:     Extraocular Movements: Extraocular movements intact.     Conjunctiva/sclera: Conjunctivae normal.     Pupils: Pupils are equal, round, and reactive to light.  Cardiovascular:     Rate and Rhythm: Normal rate and regular rhythm.     Heart sounds: Murmur heard.  Pulmonary:     Effort: Pulmonary effort is normal.     Breath sounds: No rales.  Abdominal:     General: Bowel sounds are normal.     Palpations: Abdomen is soft.     Tenderness: There is no abdominal tenderness.  Musculoskeletal:        General: Tenderness present.     Cervical back: Normal range of motion and neck supple.     Right lower leg: Edema present.     Left lower leg: Edema present.     Comments: Trace edema BLE.  Arthritic changes in fingers, R>L. The R 3rd finger mid knuckle swelling, no heat or redness. Chronic lower back pain. Left antecubital area pain with movement and palpation. No redness, swelling, warmth, or mass noted.   Skin:    General: Skin is warm and dry.  Neurological:     General: No focal deficit present.     Mental Status: She is alert. Mental status is at baseline.     Gait: Gait abnormal.     Comments: Oriented to person, place. Ambulates with walker.   Psychiatric:        Mood and Affect: Mood normal.        Behavior: Behavior normal.     Comments: Visual  hallucination vs delusion    Labs reviewed: Recent Labs    11/22/20 1109 04/12/21 0005 09/27/21 0720  NA 141 140 140  K 4.2 4.2 4.3  CL 104 104 108  CO2 22 25* 24  GLUCOSE 104*  --  90  BUN 17 18 14   CREATININE 1.01* 0.9 0.85  CALCIUM 10.0 9.8 9.4   Recent Labs    04/12/21 0005  AST 19  ALT 8  ALKPHOS 78  ALBUMIN 4.1   Recent Labs    04/12/21 0005 09/27/21 0720  WBC 6.7 5.0  NEUTROABS 4,147.00  --  HGB 11.0* 8.9*  HCT 34* 29.4*  MCV  --  98.3  PLT 282 307   Lab Results  Component Value Date   TSH 1.62 04/12/2021   No results found for: HGBA1C Lab Results  Component Value Date   CHOL 105 10/05/2020   HDL 46 10/05/2020   LDLCALC 38 10/05/2020   TRIG 119 10/05/2020   CHOLHDL 2.4 07/14/2019    Significant Diagnostic Results in last 30 days:  No results found.  Assessment/Plan  Restless leg syndrome started MiraPex 0.125mg  10/18/21, also takes Sinemet hs  Depression, psychotic (HCC) Depression/hallucination, stabilizing, takes Sertraline, Quetiapine  Erosive (osteo)arthritis  takes Plaquenil, Tylenol  f/u Rheumatology.   Chronic diastolic CHF (congestive heart failure) (HCC) trace edema BLE, takes Furosemide, cardiology, severe aortic valve stenosis. Echocardiogram 11/15/20 EF 60-65%. Bun/creat 14/0.85 09/27/21  Iron deficiency anemia  takes Fe, Hgb 11 04/11/21>>8.9 09/27/21  Aortic stenosis palliative approach  Parkinson's disease ([place]) takes Sinemet, on MiraPex 0.125mg  qhs since 10/18/21  GERD (gastroesophageal reflux disease) takes Pantoprazole 40mg  qd, Famotidine 20mg  qd. yearly FOBT per GI, Hgb 8.9 09/27/21  Renal lithiasis Hx of rena lithiasis, recurrent UTI, urosepsis, treated by ID. 09/19/21 cystoscopy, laser lithotripsy, R ureteral stent/dilatation  Pain in left arm In antecubital region with movement, no mass, redness, warmth, or swelling, chronic, will try Lidoderm patch 4% on 12 and off 12 hours, observe.    Family/ staff  Communication: plan of care reviewed with the patient and charge nurse.   Labs/tests ordered:  none  Time spend 25 minutes.

## 2021-11-06 NOTE — Assessment & Plan Note (Signed)
takes Sinemet, on MiraPex 0.125mg  qhs since 10/18/21

## 2021-11-06 NOTE — Assessment & Plan Note (Signed)
trace edema BLE, takes Furosemide, cardiology, severe aortic valve stenosis. Echocardiogram 11/15/20 EF 60-65%. Bun/creat 14/0.85 09/27/21

## 2021-11-06 NOTE — Assessment & Plan Note (Signed)
Hx of rena lithiasis, recurrent UTI, urosepsis, treated by ID. 09/19/21 cystoscopy, laser lithotripsy, R ureteral stent/dilatation

## 2021-11-06 NOTE — Assessment & Plan Note (Signed)
Depression/hallucination, stabilizing, takes Sertraline, Quetiapine

## 2021-11-07 ENCOUNTER — Encounter: Payer: Self-pay | Admitting: Nurse Practitioner

## 2021-11-07 DIAGNOSIS — R2681 Unsteadiness on feet: Secondary | ICD-10-CM | POA: Diagnosis not present

## 2021-11-07 DIAGNOSIS — R29898 Other symptoms and signs involving the musculoskeletal system: Secondary | ICD-10-CM | POA: Diagnosis not present

## 2021-11-07 DIAGNOSIS — R278 Other lack of coordination: Secondary | ICD-10-CM | POA: Diagnosis not present

## 2021-11-07 DIAGNOSIS — M25522 Pain in left elbow: Secondary | ICD-10-CM | POA: Diagnosis not present

## 2021-11-07 DIAGNOSIS — N3941 Urge incontinence: Secondary | ICD-10-CM | POA: Diagnosis not present

## 2021-11-07 DIAGNOSIS — M6281 Muscle weakness (generalized): Secondary | ICD-10-CM | POA: Diagnosis not present

## 2021-11-08 DIAGNOSIS — M79602 Pain in left arm: Secondary | ICD-10-CM | POA: Insufficient documentation

## 2021-11-08 DIAGNOSIS — M6281 Muscle weakness (generalized): Secondary | ICD-10-CM | POA: Diagnosis not present

## 2021-11-08 DIAGNOSIS — R2681 Unsteadiness on feet: Secondary | ICD-10-CM | POA: Diagnosis not present

## 2021-11-08 DIAGNOSIS — M25522 Pain in left elbow: Secondary | ICD-10-CM | POA: Diagnosis not present

## 2021-11-08 DIAGNOSIS — R29898 Other symptoms and signs involving the musculoskeletal system: Secondary | ICD-10-CM | POA: Diagnosis not present

## 2021-11-08 DIAGNOSIS — R278 Other lack of coordination: Secondary | ICD-10-CM | POA: Diagnosis not present

## 2021-11-08 DIAGNOSIS — N3941 Urge incontinence: Secondary | ICD-10-CM | POA: Diagnosis not present

## 2021-11-08 NOTE — Assessment & Plan Note (Signed)
In antecubital region with movement, no mass, redness, warmth, or swelling, chronic, will try Lidoderm patch 4% on 12 and off 12 hours, observe.

## 2021-11-09 DIAGNOSIS — G20 Parkinson's disease: Secondary | ICD-10-CM | POA: Diagnosis not present

## 2021-11-09 DIAGNOSIS — R278 Other lack of coordination: Secondary | ICD-10-CM | POA: Diagnosis not present

## 2021-11-09 DIAGNOSIS — R29898 Other symptoms and signs involving the musculoskeletal system: Secondary | ICD-10-CM | POA: Diagnosis not present

## 2021-11-09 DIAGNOSIS — R296 Repeated falls: Secondary | ICD-10-CM | POA: Diagnosis not present

## 2021-11-09 DIAGNOSIS — R2681 Unsteadiness on feet: Secondary | ICD-10-CM | POA: Diagnosis not present

## 2021-11-09 DIAGNOSIS — M6281 Muscle weakness (generalized): Secondary | ICD-10-CM | POA: Diagnosis not present

## 2021-11-09 DIAGNOSIS — N3941 Urge incontinence: Secondary | ICD-10-CM | POA: Diagnosis not present

## 2021-11-09 DIAGNOSIS — M25522 Pain in left elbow: Secondary | ICD-10-CM | POA: Diagnosis not present

## 2021-11-10 DIAGNOSIS — M6281 Muscle weakness (generalized): Secondary | ICD-10-CM | POA: Diagnosis not present

## 2021-11-10 DIAGNOSIS — N3941 Urge incontinence: Secondary | ICD-10-CM | POA: Diagnosis not present

## 2021-11-10 DIAGNOSIS — R296 Repeated falls: Secondary | ICD-10-CM | POA: Diagnosis not present

## 2021-11-10 DIAGNOSIS — R2681 Unsteadiness on feet: Secondary | ICD-10-CM | POA: Diagnosis not present

## 2021-11-10 DIAGNOSIS — M25522 Pain in left elbow: Secondary | ICD-10-CM | POA: Diagnosis not present

## 2021-11-10 DIAGNOSIS — R29898 Other symptoms and signs involving the musculoskeletal system: Secondary | ICD-10-CM | POA: Diagnosis not present

## 2021-11-13 DIAGNOSIS — R2681 Unsteadiness on feet: Secondary | ICD-10-CM | POA: Diagnosis not present

## 2021-11-13 DIAGNOSIS — N3941 Urge incontinence: Secondary | ICD-10-CM | POA: Diagnosis not present

## 2021-11-13 DIAGNOSIS — M25522 Pain in left elbow: Secondary | ICD-10-CM | POA: Diagnosis not present

## 2021-11-13 DIAGNOSIS — R296 Repeated falls: Secondary | ICD-10-CM | POA: Diagnosis not present

## 2021-11-13 DIAGNOSIS — R29898 Other symptoms and signs involving the musculoskeletal system: Secondary | ICD-10-CM | POA: Diagnosis not present

## 2021-11-13 DIAGNOSIS — M6281 Muscle weakness (generalized): Secondary | ICD-10-CM | POA: Diagnosis not present

## 2021-11-14 DIAGNOSIS — R2681 Unsteadiness on feet: Secondary | ICD-10-CM | POA: Diagnosis not present

## 2021-11-14 DIAGNOSIS — M25522 Pain in left elbow: Secondary | ICD-10-CM | POA: Diagnosis not present

## 2021-11-14 DIAGNOSIS — N3941 Urge incontinence: Secondary | ICD-10-CM | POA: Diagnosis not present

## 2021-11-14 DIAGNOSIS — M6281 Muscle weakness (generalized): Secondary | ICD-10-CM | POA: Diagnosis not present

## 2021-11-14 DIAGNOSIS — R29898 Other symptoms and signs involving the musculoskeletal system: Secondary | ICD-10-CM | POA: Diagnosis not present

## 2021-11-14 DIAGNOSIS — R296 Repeated falls: Secondary | ICD-10-CM | POA: Diagnosis not present

## 2021-11-15 DIAGNOSIS — R296 Repeated falls: Secondary | ICD-10-CM | POA: Diagnosis not present

## 2021-11-15 DIAGNOSIS — M6281 Muscle weakness (generalized): Secondary | ICD-10-CM | POA: Diagnosis not present

## 2021-11-15 DIAGNOSIS — R2681 Unsteadiness on feet: Secondary | ICD-10-CM | POA: Diagnosis not present

## 2021-11-15 DIAGNOSIS — R29898 Other symptoms and signs involving the musculoskeletal system: Secondary | ICD-10-CM | POA: Diagnosis not present

## 2021-11-15 DIAGNOSIS — N3941 Urge incontinence: Secondary | ICD-10-CM | POA: Diagnosis not present

## 2021-11-15 DIAGNOSIS — M25522 Pain in left elbow: Secondary | ICD-10-CM | POA: Diagnosis not present

## 2021-11-16 DIAGNOSIS — M25522 Pain in left elbow: Secondary | ICD-10-CM | POA: Diagnosis not present

## 2021-11-16 DIAGNOSIS — R29898 Other symptoms and signs involving the musculoskeletal system: Secondary | ICD-10-CM | POA: Diagnosis not present

## 2021-11-16 DIAGNOSIS — N3941 Urge incontinence: Secondary | ICD-10-CM | POA: Diagnosis not present

## 2021-11-16 DIAGNOSIS — R2681 Unsteadiness on feet: Secondary | ICD-10-CM | POA: Diagnosis not present

## 2021-11-16 DIAGNOSIS — R296 Repeated falls: Secondary | ICD-10-CM | POA: Diagnosis not present

## 2021-11-16 DIAGNOSIS — M6281 Muscle weakness (generalized): Secondary | ICD-10-CM | POA: Diagnosis not present

## 2021-11-20 ENCOUNTER — Non-Acute Institutional Stay (SKILLED_NURSING_FACILITY): Payer: Medicare Other | Admitting: Nurse Practitioner

## 2021-11-20 ENCOUNTER — Encounter: Payer: Self-pay | Admitting: Nurse Practitioner

## 2021-11-20 DIAGNOSIS — M25522 Pain in left elbow: Secondary | ICD-10-CM | POA: Diagnosis not present

## 2021-11-20 DIAGNOSIS — S42302D Unspecified fracture of shaft of humerus, left arm, subsequent encounter for fracture with routine healing: Secondary | ICD-10-CM | POA: Diagnosis not present

## 2021-11-20 DIAGNOSIS — S2249XD Multiple fractures of ribs, unspecified side, subsequent encounter for fracture with routine healing: Secondary | ICD-10-CM

## 2021-11-20 DIAGNOSIS — N200 Calculus of kidney: Secondary | ICD-10-CM | POA: Diagnosis not present

## 2021-11-20 DIAGNOSIS — G20 Parkinson's disease: Secondary | ICD-10-CM

## 2021-11-20 DIAGNOSIS — R29898 Other symptoms and signs involving the musculoskeletal system: Secondary | ICD-10-CM | POA: Diagnosis not present

## 2021-11-20 DIAGNOSIS — K5909 Other constipation: Secondary | ICD-10-CM

## 2021-11-20 DIAGNOSIS — I5032 Chronic diastolic (congestive) heart failure: Secondary | ICD-10-CM

## 2021-11-20 DIAGNOSIS — R2681 Unsteadiness on feet: Secondary | ICD-10-CM | POA: Diagnosis not present

## 2021-11-20 DIAGNOSIS — R300 Dysuria: Secondary | ICD-10-CM

## 2021-11-20 DIAGNOSIS — D508 Other iron deficiency anemias: Secondary | ICD-10-CM

## 2021-11-20 DIAGNOSIS — N390 Urinary tract infection, site not specified: Secondary | ICD-10-CM

## 2021-11-20 DIAGNOSIS — E039 Hypothyroidism, unspecified: Secondary | ICD-10-CM

## 2021-11-20 DIAGNOSIS — G2581 Restless legs syndrome: Secondary | ICD-10-CM

## 2021-11-20 DIAGNOSIS — K219 Gastro-esophageal reflux disease without esophagitis: Secondary | ICD-10-CM

## 2021-11-20 DIAGNOSIS — M154 Erosive (osteo)arthritis: Secondary | ICD-10-CM

## 2021-11-20 DIAGNOSIS — N3941 Urge incontinence: Secondary | ICD-10-CM | POA: Diagnosis not present

## 2021-11-20 DIAGNOSIS — F323 Major depressive disorder, single episode, severe with psychotic features: Secondary | ICD-10-CM

## 2021-11-20 DIAGNOSIS — I350 Nonrheumatic aortic (valve) stenosis: Secondary | ICD-10-CM

## 2021-11-20 DIAGNOSIS — R296 Repeated falls: Secondary | ICD-10-CM | POA: Diagnosis not present

## 2021-11-20 DIAGNOSIS — M6281 Muscle weakness (generalized): Secondary | ICD-10-CM | POA: Diagnosis not present

## 2021-11-20 NOTE — Assessment & Plan Note (Signed)
takes Plaquenil, Tylenol  f/u Rheumatology.

## 2021-11-20 NOTE — Assessment & Plan Note (Signed)
Hx of cosed fracture of multiple ribs of left side with routine healing, takes prn Tramadol, pain is better controlled.

## 2021-11-20 NOTE — Assessment & Plan Note (Signed)
takes Senokot S, Amitiza, Colace

## 2021-11-20 NOTE — Assessment & Plan Note (Signed)
takes Sinemet, on MiraPex 0.125mg  qhs since 10/18/21

## 2021-11-20 NOTE — Progress Notes (Addendum)
Location:   SNF [place] Room Number: 45A Place of Service:  SNF (31) Provider: [facility] Madolyn Ackroyd NP  [name], MD  Patient Care Team: [name], MD as PCP - General (Internal Medicine) [name], MD as PCP - Cardiology (Cardiology) [name], MD (Neurosurgery) [name], MD (Neurology) [name], MD (Inactive) as Consulting Physician (Gastroenterology) [name], MD as Consulting Physician (Urology) Tineshia Becraft X, NP as Nurse Practitioner (Internal Medicine) Tat, Eustace Quail, DO as Consulting Physician (Neurology)  Extended Emergency Contact Information Primary Emergency Contact: [facility] Address: [address]          [place], [place] [address] [name] of [place] Phone: [phone] Mobile Phone: [phone] Relation: Spouse Secondary Emergency Contact: [name] Mobile Phone: [phone] Relation: Daughter Preferred language: [name] Interpreter needed? No  Code Status: DNR Goals of care: Advanced Directive information Advanced Directives 11/20/2021  Does Patient Have a Medical Advance Directive? Yes  Type of Advance Directive Out of facility DNR (pink MOST or yellow form)  Does patient want to make changes to medical advance directive? No - Patient declined  Copy of [place] in Chart? -  Would patient like information on creating a medical advance directive? -  Pre-existing out of facility DNR order (yellow form or pink MOST form) Pink MOST/Yellow Form most recent copy in chart - Physician notified to receive inpatient order     Chief Complaint  Patient presents with  . Acute Visit    Acute visit for Dysuria    HPI:  Pt is a 87 y.o. female seen today for an acute visit for c/o burning sensation upon urination x 2 days, the patient stated there were 2 times of excruciating pain when urinated, passed a small stone, noted orange colored urine. Also c/o discomfort in  suprapubic area. Denied lower back pain, nausea, vomiting, she is afebrile.    Hx of cosed fracture of multiple ribs of left side with routine healing, takes prn Tramadol, pain is better controlled.             Hx of rena lithiasis, recurrent UTI, urosepsis, treated by ID. 09/19/21 cystoscopy, laser lithotripsy, R ureteral stent/dilatation. Resolved dysuria, decreased urinary frequency.              GERD, takes Pantoprazole 40mg  qd, Famotidine 20mg  qd. yearly FOBT per GI, Hgb 8.0 10/09/21             Parkinson's, takes Sinemet, on MiraPex 0.125mg  qhs since 10/18/21             RLS started MiraPex 0.125mg  10/18/21, also takes Sinemet hs, better sleep at night.              Depression/hallucination, stabilizing, takes Sertraline,  Quetiapine. Change Tramadol to prn helped.              Constipation, takes Senokot S, Amitiza, Colace             Hypothyroidism, takes Levothyroxine 26mcg qd. TSH 1.62 04/11/21             Erosive arthritis, takes Plaquenil, Tylenol  f/u Rheumatology.              CHF/trace edema BLE, takes Furosemide, cardiology, severe aortic valve stenosis. Echocardiogram 11/15/20 EF 60-65%. Bun/creat 14/0.85 09/27/21             Anemia, takes Fe, Hgb 8.0 10/09/21  AS Cardiology palliative approach. More noticeable DOE   Past Medical History:  Diagnosis Date  . Abnormal nuclear stress test   . Actinic cheilitis 04/23/2019  . Anemia    iron deficiency   . Anxiety   . Aortic stenosis    severe by echo 2021   . Arthritis   . Broken arm    right   . Chronic diastolic CHF (congestive heart failure) ([place])   . Chronic pain   . Cognitive communication deficit   . Cognitive deficits    Communication  . Constipation   . Depression   . Erosive (osteo)arthritis 03/09/2020  . Family history of adverse reaction to anesthesia    pt. states sister vomits  . Fibromyalgia   . GERD (gastroesophageal reflux disease)   . H/O hiatal hernia   . Hallucinations, visual 07/21/2018    03/10/20 psych consult.   . Hematuria 05/18/2020  . History of bronchitis   . History of kidney stones   . Hypercholesterolemia   . Hypertension    dr t turner  . Hypothyroidism   . Memory loss 04/21/2018   08/08/18 CT head no traumatic findings, atrophy.  09/27/19 MMSE 25/30, failed the clock drawing  . Microhematuria 03/24/2015  . Neuropathy   . Osteoporosis   . Parkinson disease ([place])   . PVD (peripheral vascular disease) (H[facility])    99% stenosis of left anteiror tibial artery, mod stenosis of left distal SFA and popliteal artery followed by [name]  . RBBB    noted on EKG 2018  . Restless leg syndrome   . Sepsis ([place])    hx of due to [facility]  . Septic shock ([place]) 04/19/2020  . Shortness of breath    with exertion - chronic   . Sjogren's disease ([place])   . Sjogren's syndrome [facility]) 02/04/2018   04/16/19 rheumatology: Erosive OA of hands, Sjogrens syndrome, low back pain at multiple sites, recurrent kidney stones, age related osteoporosis w/o current pathological fracture, f/u 6 months.   . SOB (shortness of breath)    chronic due to diastolic dysfunction, deconditioning, obesity  . Spinal stenosis    lumbar region   . Tremor   . Unstable gait 04/21/2018  . Unsteadiness on feet   . Urinary frequency 09/18/2018  . Urinary tract infection   . Visual hallucinations    Past Surgical History:  Procedure Laterality Date  . ABDOMINAL HYSTERECTOMY    . BACK SURGERY     4 back surgeries,   lumbar fusion  . CARDIAC CATHETERIZATION  2006   normal  . CYSTOSCOPY/URETEROSCOPY/HOLMIUM LASER/STENT PLACEMENT Left 01/06/2019   Procedure: CYSTOSCOPY/RETROGRADE/URETEROSCOPY/HOLMIUM LASER/BASKET RETRIEVAL/STENT PLACEMENT;  Surgeon: Ceasar Mons, MD;  Location: [facility];  Service: Urology;  Laterality: Left;  . CYSTOSCOPY/URETEROSCOPY/HOLMIUM LASER/STENT PLACEMENT Bilateral 05/24/2020   Procedure: CYSTOSCOPY/RETROGRADE/URETEROSCOPY/HOLMIUM LASER/STENT PLACEMENT;  Surgeon: Ceasar Mons, MD;  Location: [facility];  Service: Urology;  Laterality: Bilateral;  . CYSTOSCOPY/URETEROSCOPY/HOLMIUM LASER/STENT PLACEMENT Right 09/27/2021   Procedure: CYSTOSCOPY/BILATERAL RETROGRADE/URETEROSCOPY/HOLMIUM LASER/STENT PLACEMENT;  Surgeon: Ceasar Mons, MD;  Location: [facility];  Service: Urology;  Laterality: Right;  ONLY NEEDS 60 MIN  . EYE SURGERY Bilateral    cateracts  . falls     variious fall, broken wrist,and toes  . FRACTURE SURGERY Right April 2016   Wrist, Pt. fell  . IR NEPHROSTOMY PLACEMENT RIGHT  04/19/2020  . kidney stone removal Left 01/20/2019  . RIGHT/LEFT HEART CATH AND CORONARY ANGIOGRAPHY N/A 02/05/2018   Procedure: RIGHT/LEFT HEART CATH  AND CORONARY ANGIOGRAPHY;  Surgeon: Troy Sine, MD;  Location: [place] [facility];  Service: Cardiovascular;  Laterality: N/A;  . SPINAL CORD STIMULATOR INSERTION N/A 09/09/2015   Procedure: LUMBAR SPINAL CORD STIMULATOR INSERTION;  Surgeon: Clydell Hakim, MD;  Location: [place] [facility];  Service: Neurosurgery;  Laterality: N/A;  LUMBAR SPINAL CORD STIMULATOR INSERTION  . SPINE SURGERY  April 2013   Back X's 4  . TOTAL HIP ARTHROPLASTY     right  . WRIST FRACTURE SURGERY Bilateral     Allergies  Allergen Reactions  . Adrenalone   . Lactose Other (See Comments)    abd pain, lactose intolerant  . Morphine Other (See Comments)  . Sulfa Antibiotics Other (See Comments)    Headache, very sick  . Codeine Other (See Comments)    Reaction:  Headaches and nightmares   . Lactose Intolerance (Gi) Nausea And Vomiting  . Latex Rash  . Lyrica [Pregabalin] Swelling and Other (See Comments)    Reaction:  Leg swelling  . Other Other (See Comments)    Pt states that pain medications give her nightmares.    . Plaquenil [Hydroxychloroquine Sulfate] Other (See Comments)    Reaction:  GI upset   . Reglan [Metoclopramide] Other (See Comments)    Reaction:  GI upset   . Requip [Ropinirole Hcl] Other (See Comments)     Reaction:  GI upset   . Septra [Sulfamethoxazole-Trimethoprim] Nausea And Vomiting  . Shellfish Allergy Nausea And Vomiting    Allergies as of 11/20/2021       Reactions   Adrenalone    Lactose Other (See Comments)   abd pain, lactose intolerant   Morphine Other (See Comments)   Sulfa Antibiotics Other (See Comments)   Headache, very sick   Codeine Other (See Comments)   Reaction:  Headaches and nightmares    Lactose Intolerance (gi) Nausea And Vomiting   Latex Rash   Lyrica [pregabalin] Swelling, Other (See Comments)   Reaction:  Leg swelling   Other Other (See Comments)   Pt states that pain medications give her nightmares.     Plaquenil [hydroxychloroquine Sulfate] Other (See Comments)   Reaction:  GI upset    Reglan [metoclopramide] Other (See Comments)   Reaction:  GI upset    Requip [ropinirole Hcl] Other (See Comments)   Reaction:  GI upset    Septra [sulfamethoxazole-trimethoprim] Nausea And Vomiting   Shellfish Allergy Nausea And Vomiting        Medication List        Accurate as of November 20, 2021 11:59 PM. If you have any questions, ask your nurse or doctor.          acetaminophen 500 MG tablet Commonly known as: TYLENOL Take 500 mg by mouth See admin instructions. Take 1 tablet (500 mg) by mouth scheduled 3 times daily & as needed up to 3 times daily as needed for pain (Not to exceed 3g/24 hrs) PRN & Scheduled doses   AeroChamber MV inhaler by Other route. Use as instructed as needed   aspirin 81 MG chewable tablet Chew 81 mg by mouth daily.   Calcium 600/Vitamin D3 600-20 MG-MCG Tabs Generic drug: Calcium Carb-Cholecalciferol Take 1 tablet by mouth daily.   carbidopa-levodopa 25-100 MG tablet Commonly known as: SINEMET IR Take 1 tablet by mouth 3 (three) times daily.   Carbidopa-Levodopa ER 25-100 MG tablet controlled release Commonly known as: SINEMET CR Take 1 tablet by mouth at bedtime.   docusate sodium 100  MG capsule Commonly  known as: COLACE Take 100 mg by mouth at bedtime.   eucerin cream Apply 1 application topically daily.   famotidine 20 MG tablet Commonly known as: PEPCID Take 20 mg by mouth daily.   furosemide 20 MG tablet Commonly known as: LASIX Take 1 tablet (20 mg total) by mouth daily. What changed:  when to take this additional instructions   hydroxychloroquine 200 MG tablet Commonly known as: PLAQUENIL Take 200 mg by mouth daily.   lactose free nutrition Liqd Take by mouth 2 (two) times daily. 1/2 box; Resident likes chocolate. Twice A Day   levothyroxine 50 MCG tablet Commonly known as: SYNTHROID Take 50 mcg by mouth daily before breakfast.   lubiprostone 24 MCG capsule Commonly known as: AMITIZA Take 24 mcg by mouth 2 (two) times daily with a meal.   multivitamin with minerals Tabs tablet Take 1 tablet by mouth daily.   nystatin powder Commonly known as: MYCOSTATIN/NYSTOP Apply topically 2 (two) times daily as needed. What changed: reasons to take this   pantoprazole 40 MG tablet Commonly known as: PROTONIX Take 40 mg by mouth daily.   potassium citrate 10 MEQ (1080 MG) SR tablet Commonly known as: UROCIT-K Take 10 mEq by mouth 2 (two) times daily.   pramipexole 0.125 MG tablet Commonly known as: MIRAPEX Take 1 tablet (0.125 mg total) by mouth at bedtime.   PRESERVISION AREDS 2 PO Take 1 tablet by mouth daily.   QUEtiapine 25 MG tablet Commonly known as: SEROQUEL Take 12.5-25 mg by mouth See admin instructions. Take 0.5 tablet (12.5 mg) by mouth in the morning & take 1 tablet (25 mg) by mouth in the evening.   rosuvastatin 20 MG tablet Commonly known as: CRESTOR Take 20 mg by mouth daily.   saccharomyces boulardii 250 MG capsule Commonly known as: FLORASTOR Take 250 mg by mouth daily.   Salonpas Pain Relieving 4 % Ptch Generic drug: Lidocaine Apply 1 patch topically. Apply lidocaine patch in the morning and remove at bedtime.   senna-docusate 8.6-50  MG tablet Commonly known as: Senokot-S Take 1 tablet by mouth at bedtime.   sertraline 50 MG tablet Commonly known as: ZOLOFT Take 100 mg by mouth at bedtime.   Slow Fe 142 (45 Fe) MG Tbcr Generic drug: Ferrous Sulfate Take 142 mg by mouth in the morning.   Systane 0.4-0.3 % Soln Generic drug: Polyethyl Glycol-Propyl Glycol Apply 1 drop to eye 2 (two) times daily as needed (dry eyes).   traMADol 50 MG tablet Commonly known as: ULTRAM Take 0.5 tablets (25 mg total) by mouth 3 (three) times daily as needed.   Vitamin D 50 MCG (2000 UT) tablet Take 2,000 Units by mouth daily.       The patient's husband and charge nurse present during today's visit.  Review of Systems  Constitutional:  Positive for fatigue. Negative for fever and unexpected weight change.  HENT:  Positive for hearing loss. Negative for congestion and voice change.   Eyes:  Negative for visual disturbance.  Respiratory:  Positive for shortness of breath. Negative for cough.        Chronic DOE  Cardiovascular:  Positive for leg swelling.  Gastrointestinal:  Negative for constipation, nausea and vomiting.       Suprapubic region discomfort.   Genitourinary:  Positive for dysuria, frequency, hematuria and urgency.  Musculoskeletal:  Positive for arthralgias, back pain, gait problem and myalgias.       Chronic lower back pain. The R  3rd finger mid knuckle pain is chronic.   Skin:  Negative for color change.  Neurological:  Positive for tremors. Negative for speech difficulty, weakness and light-headedness.       Memory lapses. Minimal resting tremor in fingers, not disabling. RLS  Psychiatric/Behavioral:  Positive for hallucinations. Negative for behavioral problems and sleep disturbance. The patient is not nervous/anxious.    Immunization History  Administered Date(s) Administered  . Hepatitis A, Adult 03/01/1997, 09/20/1997  . Influenza Split 09/09/2013, 09/27/2014  . Influenza, High Dose Seasonal PF  08/01/2011, 08/27/2012, 10/14/2013, 09/08/2015, 09/10/2016, 09/12/2017, 09/12/2018, 09/15/2019  . Influenza,inj,Quad PF,6+ Mos 09/11/2016  . Influenza-Unspecified 09/21/2020, 09/28/2021  . Moderna Sars-Covid-2 Vaccination 12/12/2019, 01/09/2020, 10/18/2020, 05/09/2021, 08/29/2021  . Pneumococcal Conjugate-13 04/15/2014  . Pneumococcal Polysaccharide-23 09/28/2005  . Td 06/20/2016  . Tdap 04/29/2006, 09/01/2015  . Zoster Recombinat (Shingrix) 06/11/2018, 09/10/2018  . Zoster, Live 04/29/2006   Pertinent  Health Maintenance Due  Topic Date Due  . INFLUENZA VACCINE  Completed  . DEXA SCAN  Completed   Fall Risk 08/03/2021 08/04/2021 08/09/2021 09/27/2021 10/10/2021  Falls in the past year? 1 1 1  - 1  Was there an injury with Fall? 1 1 1  - 0  Was there an injury with Fall? - - - - -  Fall Risk Category Calculator 2 3 2  - 2  Fall Risk Category Moderate High Moderate - Moderate  Patient Fall Risk Level - High fall risk High fall risk High fall risk Moderate fall risk  Patient at Risk for Falls Due to - - History of fall(s) - -  Patient at Risk for Falls Due to - - - - -  Fall risk Follow up - - Falls evaluation completed;Education provided - -   Functional Status Survey:    Vitals:   11/20/21 1639  BP: 107/70  Pulse: 72  Resp: 16  Temp: (!) 97.1 F (36.2 C)  SpO2: 99%  Weight: 129 lb (58.5 kg)  Height: 5\' 2"  (1.575 m)   Body mass index is 23.59 kg/m. Physical Exam Vitals and nursing note reviewed.  Constitutional:      Appearance: Normal appearance.  HENT:     Head: Normocephalic and atraumatic.     Mouth/Throat:     Mouth: Mucous membranes are moist.  Eyes:     Extraocular Movements: Extraocular movements intact.     Conjunctiva/sclera: Conjunctivae normal.     Pupils: Pupils are equal, round, and reactive to light.  Cardiovascular:     Rate and Rhythm: Normal rate and regular rhythm.     Heart sounds: Murmur heard.  Pulmonary:     Effort: Pulmonary effort is normal.      Breath sounds: No rales.  Abdominal:     General: Bowel sounds are normal.     Palpations: Abdomen is soft.     Tenderness: There is no right CVA tenderness, left CVA tenderness, guarding or rebound.     Comments: Suprapubic area discomfort when palpated.   Musculoskeletal:        General: Tenderness present.     Cervical back: Normal range of motion and neck supple.     Right lower leg: Edema present.     Left lower leg: Edema present.     Comments: Trace edema BLE.  Arthritic changes in fingers, R>L. The R 3rd finger mid knuckle swelling, no heat or redness. Chronic lower back pain. Left antecubital area pain with movement and palpation. No redness, swelling, warmth, or mass noted.   Skin:  General: Skin is warm and dry.  Neurological:     General: No focal deficit present.     Mental Status: She is alert. Mental status is at baseline.     Gait: Gait abnormal.     Comments: Oriented to person, place. Ambulates with walker.   Psychiatric:        Mood and Affect: Mood normal.        Behavior: Behavior normal.     Comments: Visual hallucination vs delusion    Labs reviewed: Recent Labs    04/12/21 0005 09/27/21 0720  NA 140 140  K 4.2 4.3  CL 104 108  CO2 25* 24  GLUCOSE  --  90  BUN 18 14  CREATININE 0.9 0.85  CALCIUM 9.8 9.4   Recent Labs    04/12/21 0005  AST 19  ALT 8  ALKPHOS 78  ALBUMIN 4.1   Recent Labs    04/12/21 0005 09/27/21 0720 10/05/21 0000 10/09/21 0000  WBC 6.7 5.0 5.1 5.4  NEUTROABS 4,147.00  --  2,739.00 2,538.00  HGB 11.0* 8.9* 8.0* 8.0*  HCT 34* 29.4* 26* 26*  MCV  --  98.3  --   --   PLT 282 307 253 281   Lab Results  Component Value Date   TSH 1.62 04/12/2021   No results found for: HGBA1C Lab Results  Component Value Date   CHOL 105 10/05/2020   HDL 46 10/05/2020   LDLCALC 38 10/05/2020   TRIG 119 10/05/2020   CHOLHDL 2.4 07/14/2019    Significant Diagnostic Results in last 30 days:  No results  found.  Assessment/Plan: Dysuria c/o burning sensation upon urination x 2 days, the patient stated there were 2 times of excruciating pain when urinated, passed a small stone, noted orange colored urine. Also c/o discomfort in suprapubic area. Denied lower back pain, nausea, vomiting, she is afebrile.   UA C/S to r/o UTI  Closed multiple fractures of left upper extremity with ribs with routine healing Hx of cosed fracture of multiple ribs of left side with routine healing, takes prn Tramadol, pain is better controlled.  Renal lithiasis Hx of rena lithiasis, recurrent UTI, urosepsis, treated by ID. 09/19/21 cystoscopy, laser lithotripsy, R ureteral stent/dilatation. Recurred dysuria, decreased urinary frequency after initial resolution.   GERD (gastroesophageal reflux disease) takes Pantoprazole 40mg  qd, Famotidine 20mg  qd. yearly FOBT per GI, , Hgb 8.0 10/09/21. Update CBC  Parkinson's disease ([place]) takes Sinemet, on MiraPex 0.125mg  qhs since 10/18/21  Restless leg syndrome started MiraPex 0.125mg  10/18/21, also takes Sinemet hs, better sleep at night.   Depression, psychotic ([place]) Depression/hallucination, stabilizing, takes Sertraline,  Quetiapine. Change Tramadol to prn he  Chronic constipation  takes Senokot S, Amitiza, Colace  Hypothyroidism takes Levothyroxine 6mcg qd. TSH 1.62 04/11/21  Erosive (osteo)arthritis takes Plaquenil, Tylenol  f/u Rheumatology.   Chronic diastolic CHF (congestive heart failure) ([facility])  CHF/trace edema BLE, takes Furosemide, cardiology, severe aortic valve stenosis. Echocardiogram 11/15/20 EF 60-65%. Bun/creat 14/0.85 09/27/21  Iron deficiency anemia  takes Fe, Hgb 8.0 10/09/21, update CBC/diff  Aortic stenosis Cardiology palliative approach. More noticeable DOE  Recurrent UTI 11/21/21 urine culture E coli>100,000c/ml, Cipro 500mg  bid x 10 days stared 11/24/21    Family/ staff Communication: plan of care reviewed with the patient, the  patient's husband, and charge nurse.   Labs/tests ordered:  UA C/S, CBC/diff.   Time spend 25 minutes.

## 2021-11-20 NOTE — Assessment & Plan Note (Signed)
c/o burning sensation upon urination x 2 days, the patient stated there were 2 times of excruciating pain when urinated, passed a small stone, noted orange colored urine. Also c/o discomfort in suprapubic area. Denied lower back pain, nausea, vomiting, she is afebrile.   UA C/S to r/o UTI

## 2021-11-20 NOTE — Assessment & Plan Note (Addendum)
takes Fe, Hgb 8.0 10/09/21, update CBC/diff

## 2021-11-20 NOTE — Assessment & Plan Note (Signed)
Cardiology palliative approach. More noticeable DOE

## 2021-11-20 NOTE — Assessment & Plan Note (Signed)
Hx of rena lithiasis, recurrent UTI, urosepsis, treated by ID. 09/19/21 cystoscopy, laser lithotripsy, R ureteral stent/dilatation. Recurred dysuria, decreased urinary frequency after initial resolution.

## 2021-11-20 NOTE — Assessment & Plan Note (Signed)
Depression/hallucination, stabilizing, takes Sertraline,  Quetiapine. Change Tramadol to prn he

## 2021-11-20 NOTE — Assessment & Plan Note (Signed)
started MiraPex 0.125mg  10/18/21, also takes Sinemet hs, better sleep at night.

## 2021-11-20 NOTE — Assessment & Plan Note (Signed)
CHF/trace edema BLE, takes Furosemide, cardiology, severe aortic valve stenosis. Echocardiogram 11/15/20 EF 60-65%. Bun/creat 14/0.85 09/27/21

## 2021-11-20 NOTE — Assessment & Plan Note (Signed)
takes Levothyroxine 107mcg qd. TSH 1.62 04/11/21

## 2021-11-20 NOTE — Assessment & Plan Note (Addendum)
takes Pantoprazole 40mg  qd, Famotidine 20mg  qd. yearly FOBT per GI, , Hgb 8.0 10/09/21. Update CBC

## 2021-11-21 ENCOUNTER — Encounter: Payer: Self-pay | Admitting: Nurse Practitioner

## 2021-11-21 DIAGNOSIS — N390 Urinary tract infection, site not specified: Secondary | ICD-10-CM | POA: Diagnosis not present

## 2021-11-22 DIAGNOSIS — R29898 Other symptoms and signs involving the musculoskeletal system: Secondary | ICD-10-CM | POA: Diagnosis not present

## 2021-11-22 DIAGNOSIS — M25522 Pain in left elbow: Secondary | ICD-10-CM | POA: Diagnosis not present

## 2021-11-22 DIAGNOSIS — N3941 Urge incontinence: Secondary | ICD-10-CM | POA: Diagnosis not present

## 2021-11-22 DIAGNOSIS — M6281 Muscle weakness (generalized): Secondary | ICD-10-CM | POA: Diagnosis not present

## 2021-11-22 DIAGNOSIS — R296 Repeated falls: Secondary | ICD-10-CM | POA: Diagnosis not present

## 2021-11-22 DIAGNOSIS — R2681 Unsteadiness on feet: Secondary | ICD-10-CM | POA: Diagnosis not present

## 2021-11-23 DIAGNOSIS — M6281 Muscle weakness (generalized): Secondary | ICD-10-CM | POA: Diagnosis not present

## 2021-11-23 DIAGNOSIS — R2681 Unsteadiness on feet: Secondary | ICD-10-CM | POA: Diagnosis not present

## 2021-11-23 DIAGNOSIS — I10 Essential (primary) hypertension: Secondary | ICD-10-CM | POA: Diagnosis not present

## 2021-11-23 DIAGNOSIS — R29898 Other symptoms and signs involving the musculoskeletal system: Secondary | ICD-10-CM | POA: Diagnosis not present

## 2021-11-23 DIAGNOSIS — M25522 Pain in left elbow: Secondary | ICD-10-CM | POA: Diagnosis not present

## 2021-11-23 DIAGNOSIS — R296 Repeated falls: Secondary | ICD-10-CM | POA: Diagnosis not present

## 2021-11-23 DIAGNOSIS — D649 Anemia, unspecified: Secondary | ICD-10-CM | POA: Diagnosis not present

## 2021-11-23 DIAGNOSIS — N3941 Urge incontinence: Secondary | ICD-10-CM | POA: Diagnosis not present

## 2021-11-23 LAB — CBC AND DIFFERENTIAL
HCT: 31 — AB (ref 36–46)
Hemoglobin: 10.1 — AB (ref 12.0–16.0)
Platelets: 268 (ref 150–399)
WBC: 6.9

## 2021-11-23 LAB — CBC: RBC: 3.35 — AB (ref 3.87–5.11)

## 2021-11-24 ENCOUNTER — Other Ambulatory Visit: Payer: Self-pay | Admitting: Orthopedic Surgery

## 2021-11-24 DIAGNOSIS — N3000 Acute cystitis without hematuria: Secondary | ICD-10-CM

## 2021-11-27 DIAGNOSIS — M25522 Pain in left elbow: Secondary | ICD-10-CM | POA: Diagnosis not present

## 2021-11-27 DIAGNOSIS — R2681 Unsteadiness on feet: Secondary | ICD-10-CM | POA: Diagnosis not present

## 2021-11-27 DIAGNOSIS — R296 Repeated falls: Secondary | ICD-10-CM | POA: Diagnosis not present

## 2021-11-27 DIAGNOSIS — N3941 Urge incontinence: Secondary | ICD-10-CM | POA: Diagnosis not present

## 2021-11-27 DIAGNOSIS — R29898 Other symptoms and signs involving the musculoskeletal system: Secondary | ICD-10-CM | POA: Diagnosis not present

## 2021-11-27 DIAGNOSIS — M6281 Muscle weakness (generalized): Secondary | ICD-10-CM | POA: Diagnosis not present

## 2021-11-27 NOTE — Assessment & Plan Note (Signed)
11/21/21 urine culture E coli>100,000c/ml, Cipro 500mg  bid x 10 days stared 11/24/21

## 2021-11-29 DIAGNOSIS — M6281 Muscle weakness (generalized): Secondary | ICD-10-CM | POA: Diagnosis not present

## 2021-11-29 DIAGNOSIS — N3941 Urge incontinence: Secondary | ICD-10-CM | POA: Diagnosis not present

## 2021-11-29 DIAGNOSIS — R296 Repeated falls: Secondary | ICD-10-CM | POA: Diagnosis not present

## 2021-11-29 DIAGNOSIS — M25522 Pain in left elbow: Secondary | ICD-10-CM | POA: Diagnosis not present

## 2021-11-29 DIAGNOSIS — R29898 Other symptoms and signs involving the musculoskeletal system: Secondary | ICD-10-CM | POA: Diagnosis not present

## 2021-11-29 DIAGNOSIS — R2681 Unsteadiness on feet: Secondary | ICD-10-CM | POA: Diagnosis not present

## 2021-12-01 MED ORDER — CIPROFLOXACIN HCL 500 MG PO TABS: 500.0000 mg | ORAL_TABLET | Freq: Two times a day (BID) | ORAL | 0 refills | Status: AC

## 2021-12-04 DIAGNOSIS — R2681 Unsteadiness on feet: Secondary | ICD-10-CM | POA: Diagnosis not present

## 2021-12-04 DIAGNOSIS — R29898 Other symptoms and signs involving the musculoskeletal system: Secondary | ICD-10-CM | POA: Diagnosis not present

## 2021-12-04 DIAGNOSIS — M25522 Pain in left elbow: Secondary | ICD-10-CM | POA: Diagnosis not present

## 2021-12-04 DIAGNOSIS — R296 Repeated falls: Secondary | ICD-10-CM | POA: Diagnosis not present

## 2021-12-04 DIAGNOSIS — N3941 Urge incontinence: Secondary | ICD-10-CM | POA: Diagnosis not present

## 2021-12-04 DIAGNOSIS — M6281 Muscle weakness (generalized): Secondary | ICD-10-CM | POA: Diagnosis not present

## 2021-12-05 DIAGNOSIS — R296 Repeated falls: Secondary | ICD-10-CM | POA: Diagnosis not present

## 2021-12-05 DIAGNOSIS — R29898 Other symptoms and signs involving the musculoskeletal system: Secondary | ICD-10-CM | POA: Diagnosis not present

## 2021-12-05 DIAGNOSIS — M6281 Muscle weakness (generalized): Secondary | ICD-10-CM | POA: Diagnosis not present

## 2021-12-05 DIAGNOSIS — R2681 Unsteadiness on feet: Secondary | ICD-10-CM | POA: Diagnosis not present

## 2021-12-05 DIAGNOSIS — N3941 Urge incontinence: Secondary | ICD-10-CM | POA: Diagnosis not present

## 2021-12-05 DIAGNOSIS — M25522 Pain in left elbow: Secondary | ICD-10-CM | POA: Diagnosis not present

## 2021-12-08 DIAGNOSIS — R296 Repeated falls: Secondary | ICD-10-CM | POA: Diagnosis not present

## 2021-12-08 DIAGNOSIS — R29898 Other symptoms and signs involving the musculoskeletal system: Secondary | ICD-10-CM | POA: Diagnosis not present

## 2021-12-08 DIAGNOSIS — N3941 Urge incontinence: Secondary | ICD-10-CM | POA: Diagnosis not present

## 2021-12-08 DIAGNOSIS — R2681 Unsteadiness on feet: Secondary | ICD-10-CM | POA: Diagnosis not present

## 2021-12-08 DIAGNOSIS — M25522 Pain in left elbow: Secondary | ICD-10-CM | POA: Diagnosis not present

## 2021-12-08 DIAGNOSIS — M6281 Muscle weakness (generalized): Secondary | ICD-10-CM | POA: Diagnosis not present

## 2021-12-11 DIAGNOSIS — M6281 Muscle weakness (generalized): Secondary | ICD-10-CM | POA: Diagnosis not present

## 2021-12-11 DIAGNOSIS — R296 Repeated falls: Secondary | ICD-10-CM | POA: Diagnosis not present

## 2021-12-11 DIAGNOSIS — G20 Parkinson's disease: Secondary | ICD-10-CM | POA: Diagnosis not present

## 2021-12-11 DIAGNOSIS — R278 Other lack of coordination: Secondary | ICD-10-CM | POA: Diagnosis not present

## 2021-12-11 DIAGNOSIS — R2681 Unsteadiness on feet: Secondary | ICD-10-CM | POA: Diagnosis not present

## 2021-12-12 DIAGNOSIS — R296 Repeated falls: Secondary | ICD-10-CM | POA: Diagnosis not present

## 2021-12-12 DIAGNOSIS — R2681 Unsteadiness on feet: Secondary | ICD-10-CM | POA: Diagnosis not present

## 2021-12-12 DIAGNOSIS — R278 Other lack of coordination: Secondary | ICD-10-CM | POA: Diagnosis not present

## 2021-12-12 DIAGNOSIS — G20 Parkinson's disease: Secondary | ICD-10-CM | POA: Diagnosis not present

## 2021-12-12 DIAGNOSIS — M6281 Muscle weakness (generalized): Secondary | ICD-10-CM | POA: Diagnosis not present

## 2021-12-14 DIAGNOSIS — R2681 Unsteadiness on feet: Secondary | ICD-10-CM | POA: Diagnosis not present

## 2021-12-14 DIAGNOSIS — M6281 Muscle weakness (generalized): Secondary | ICD-10-CM | POA: Diagnosis not present

## 2021-12-14 DIAGNOSIS — R296 Repeated falls: Secondary | ICD-10-CM | POA: Diagnosis not present

## 2021-12-14 DIAGNOSIS — G20 Parkinson's disease: Secondary | ICD-10-CM | POA: Diagnosis not present

## 2021-12-14 DIAGNOSIS — R278 Other lack of coordination: Secondary | ICD-10-CM | POA: Diagnosis not present

## 2021-12-15 DIAGNOSIS — R441 Visual hallucinations: Secondary | ICD-10-CM | POA: Diagnosis not present

## 2021-12-15 DIAGNOSIS — F323 Major depressive disorder, single episode, severe with psychotic features: Secondary | ICD-10-CM | POA: Diagnosis not present

## 2021-12-15 DIAGNOSIS — F411 Generalized anxiety disorder: Secondary | ICD-10-CM | POA: Diagnosis not present

## 2021-12-18 DIAGNOSIS — R2681 Unsteadiness on feet: Secondary | ICD-10-CM | POA: Diagnosis not present

## 2021-12-18 DIAGNOSIS — R296 Repeated falls: Secondary | ICD-10-CM | POA: Diagnosis not present

## 2021-12-18 DIAGNOSIS — R278 Other lack of coordination: Secondary | ICD-10-CM | POA: Diagnosis not present

## 2021-12-18 DIAGNOSIS — G20 Parkinson's disease: Secondary | ICD-10-CM | POA: Diagnosis not present

## 2021-12-18 DIAGNOSIS — M6281 Muscle weakness (generalized): Secondary | ICD-10-CM | POA: Diagnosis not present

## 2021-12-19 DIAGNOSIS — M6281 Muscle weakness (generalized): Secondary | ICD-10-CM | POA: Diagnosis not present

## 2021-12-19 DIAGNOSIS — R2681 Unsteadiness on feet: Secondary | ICD-10-CM | POA: Diagnosis not present

## 2021-12-19 DIAGNOSIS — R278 Other lack of coordination: Secondary | ICD-10-CM | POA: Diagnosis not present

## 2021-12-19 DIAGNOSIS — G20 Parkinson's disease: Secondary | ICD-10-CM | POA: Diagnosis not present

## 2021-12-19 DIAGNOSIS — R296 Repeated falls: Secondary | ICD-10-CM | POA: Diagnosis not present

## 2021-12-21 DIAGNOSIS — R278 Other lack of coordination: Secondary | ICD-10-CM | POA: Diagnosis not present

## 2021-12-21 DIAGNOSIS — R296 Repeated falls: Secondary | ICD-10-CM | POA: Diagnosis not present

## 2021-12-21 DIAGNOSIS — G20 Parkinson's disease: Secondary | ICD-10-CM | POA: Diagnosis not present

## 2021-12-21 DIAGNOSIS — R2681 Unsteadiness on feet: Secondary | ICD-10-CM | POA: Diagnosis not present

## 2021-12-21 DIAGNOSIS — M6281 Muscle weakness (generalized): Secondary | ICD-10-CM | POA: Diagnosis not present

## 2021-12-22 ENCOUNTER — Encounter: Payer: Self-pay | Admitting: Nurse Practitioner

## 2021-12-22 ENCOUNTER — Non-Acute Institutional Stay (SKILLED_NURSING_FACILITY): Payer: Medicare Other | Admitting: Nurse Practitioner

## 2021-12-22 DIAGNOSIS — G20 Parkinson's disease: Secondary | ICD-10-CM

## 2021-12-22 DIAGNOSIS — E039 Hypothyroidism, unspecified: Secondary | ICD-10-CM | POA: Diagnosis not present

## 2021-12-22 DIAGNOSIS — G2581 Restless legs syndrome: Secondary | ICD-10-CM

## 2021-12-22 DIAGNOSIS — I5032 Chronic diastolic (congestive) heart failure: Secondary | ICD-10-CM

## 2021-12-22 DIAGNOSIS — D508 Other iron deficiency anemias: Secondary | ICD-10-CM | POA: Diagnosis not present

## 2021-12-22 DIAGNOSIS — N200 Calculus of kidney: Secondary | ICD-10-CM | POA: Diagnosis not present

## 2021-12-22 DIAGNOSIS — M154 Erosive (osteo)arthritis: Secondary | ICD-10-CM | POA: Diagnosis not present

## 2021-12-22 DIAGNOSIS — F323 Major depressive disorder, single episode, severe with psychotic features: Secondary | ICD-10-CM

## 2021-12-22 DIAGNOSIS — K5909 Other constipation: Secondary | ICD-10-CM

## 2021-12-22 DIAGNOSIS — I350 Nonrheumatic aortic (valve) stenosis: Secondary | ICD-10-CM | POA: Diagnosis not present

## 2021-12-22 DIAGNOSIS — K219 Gastro-esophageal reflux disease without esophagitis: Secondary | ICD-10-CM

## 2021-12-22 DIAGNOSIS — K623 Rectal prolapse: Secondary | ICD-10-CM | POA: Diagnosis not present

## 2021-12-22 NOTE — Assessment & Plan Note (Signed)
takes Pantoprazole 40mg  qd, Famotidine 20mg  qd. yearly FOBT per GI, Hgb 8.0 10/09/21

## 2021-12-22 NOTE — Progress Notes (Signed)
Location:  [facility] Room Number: N045-A Place of Service:  SNF [phone]) Provider:  Marlana Latus, NP   Patient Care Team: [name], MD as PCP - General (Internal Medicine) [name], MD as PCP - Cardiology (Cardiology) [name], MD (Neurosurgery) [name], MD (Neurology) [name], MD (Inactive) as Consulting Physician (Gastroenterology) [name], MD as Consulting Physician (Urology) Aeris Hersman X, NP as Nurse Practitioner (Internal Medicine) Tat, Eustace Quail, DO as Consulting Physician (Neurology)  Extended Emergency Contact Information Primary Emergency Contact: [facility] Address: [address]          [place], [place] [address] [name] Phone: [phone] Mobile Phone: [phone] Relation: Spouse Secondary Emergency Contact: [name] Mobile Phone: [phone] Relation: Daughter Preferred language: [name] Interpreter needed? No  Code Status:  Full Code  Goals of care: Advanced Directive information Advanced Directives 12/22/2021  Does Patient Have a Medical Advance Directive? Yes  Type of Advance Directive Out of facility DNR (pink MOST or yellow form);Living will;Healthcare Power of Attorney  Does patient want to make changes to medical advance directive? No - Patient declined  Copy of [place] in Chart? Yes - validated most recent copy scanned in chart (See row information)  Would patient like information on creating a medical advance directive? -  Pre-existing out of facility DNR order (yellow form or pink MOST form) Yellow form placed in chart (order not valid for inpatient use)     Chief Complaint  Patient presents with   Acute Visit    Hemorrhoids and constipation     HPI:  Pt is a 88 y.o. female seen today for an acute visit for reported a tennis ball sized protruding rectum after a large hard BM. It was reduced upon my examination. The  patient denied anal or abd pain.      Hx of rena lithiasis, recurrent UTI, urosepsis, treated by ID. 09/19/21 cystoscopy, laser lithotripsy, R ureteral stent/dilatation. Resolved dysuria, decreased urinary frequency.              GERD, takes Pantoprazole 40mg  qd, Famotidine 20mg  qd. yearly FOBT per GI, Hgb 8.0 10/09/21             Parkinson's, takes Sinemet, on MiraPex 0.125mg  qhs since 10/18/21             RLS started MiraPex 0.125mg  10/18/21, also takes Sinemet hs, better sleep at night.              Depression/hallucination, stabilizing, takes Sertraline,  Quetiapine. Change Tramadol to prn helped.              Constipation, takes Senokot S, Amitiza, Colace, add MiraLax              Hypothyroidism, takes Levothyroxine 36mcg qd. TSH 1.62 04/11/21             Erosive arthritis, takes Plaquenil, Tylenol  f/u Rheumatology.              CHF/trace edema BLE, takes Furosemide, cardiology, severe aortic valve stenosis. Echocardiogram 11/15/20 EF 60-65%. Bun/creat 14/0.85 09/27/21             Anemia, takes Fe, Hgb 10.1 11/23/21             AS Cardiology palliative approach. More noticeable DOE Past Medical History:  Diagnosis Date   Abnormal nuclear stress test    Actinic cheilitis 04/23/2019   Anemia    iron  deficiency    Anxiety    Aortic stenosis    severe by echo 2021    Arthritis    Broken arm    right    Chronic diastolic CHF (congestive heart failure) ([place])    Chronic pain    Cognitive communication deficit    Cognitive deficits    Communication   Constipation    Depression    Erosive (osteo)arthritis 03/09/2020   Family history of adverse reaction to anesthesia    pt. states sister vomits   Fibromyalgia    GERD (gastroesophageal reflux disease)    H/O hiatal hernia    Hallucinations, visual 07/21/2018   03/10/20 psych consult.    Hematuria 05/18/2020   History of bronchitis    History of kidney stones    Hypercholesterolemia    Hypertension    dr t turner   Hypothyroidism     Memory loss 04/21/2018   08/08/18 CT head no traumatic findings, atrophy.  09/27/19 MMSE 25/30, failed the clock drawing   Microhematuria 03/24/2015   Neuropathy    Osteoporosis    Parkinson disease (HCC)    PVD (peripheral vascular disease) (HCC)    99% stenosis of left anteiror tibial artery, mod stenosis of left distal SFA and popliteal artery followed by [name]   RBBB    noted on EKG 2018   Restless leg syndrome    Sepsis ([place])    hx of due to Ecoli   Septic shock ([place]) 04/19/2020   Shortness of breath    with exertion - chronic    Sjogren's disease ([place])    Sjogren's syndrome ([place]) 02/04/2018   04/16/19 rheumatology: Erosive OA of hands, Sjogrens syndrome, low back pain at multiple sites, recurrent kidney stones, age related osteoporosis w/o current pathological fracture, f/u 6 months.    SOB (shortness of breath)    chronic due to diastolic dysfunction, deconditioning, obesity   Spinal stenosis    lumbar region    Tremor    Unstable gait 04/21/2018   Unsteadiness on feet    Urinary frequency 09/18/2018   Urinary tract infection    Visual hallucinations    Past Surgical History:  Procedure Laterality Date   ABDOMINAL HYSTERECTOMY     BACK SURGERY     4 back surgeries,   lumbar fusion   CARDIAC CATHETERIZATION  2006   normal   CYSTOSCOPY/URETEROSCOPY/HOLMIUM LASER/STENT PLACEMENT Left 01/06/2019   Procedure: CYSTOSCOPY/RETROGRADE/URETEROSCOPY/HOLMIUM LASER/BASKET RETRIEVAL/STENT PLACEMENT;  Surgeon: Ceasar Mons, MD;  Location: [facility];  Service: Urology;  Laterality: Left;   CYSTOSCOPY/URETEROSCOPY/HOLMIUM LASER/STENT PLACEMENT Bilateral 05/24/2020   Procedure: CYSTOSCOPY/RETROGRADE/URETEROSCOPY/HOLMIUM LASER/STENT PLACEMENT;  Surgeon: Ceasar Mons, MD;  Location: [facility];  Service: Urology;  Laterality: Bilateral;   CYSTOSCOPY/URETEROSCOPY/HOLMIUM LASER/STENT PLACEMENT Right 09/27/2021   Procedure: CYSTOSCOPY/BILATERAL  RETROGRADE/URETEROSCOPY/HOLMIUM LASER/STENT PLACEMENT;  Surgeon: Ceasar Mons, MD;  Location: [facility];  Service: Urology;  Laterality: Right;  ONLY NEEDS 60 MIN   EYE SURGERY Bilateral    cateracts   falls     variious fall, broken wrist,and toes   FRACTURE SURGERY Right April 2016   Wrist, Pt. fell   IR NEPHROSTOMY PLACEMENT RIGHT  04/19/2020   kidney stone removal Left 01/20/2019   RIGHT/LEFT HEART CATH AND CORONARY ANGIOGRAPHY N/A 02/05/2018   Procedure: RIGHT/LEFT HEART CATH AND CORONARY ANGIOGRAPHY;  Surgeon: Troy Sine, MD;  Location: [facility];  Service: Cardiovascular;  Laterality: N/A;   SPINAL CORD STIMULATOR INSERTION N/A 09/09/2015   Procedure: LUMBAR SPINAL  CORD STIMULATOR INSERTION;  Surgeon: Clydell Hakim, MD;  Location: [facility];  Service: Neurosurgery;  Laterality: N/A;  LUMBAR SPINAL CORD STIMULATOR INSERTION   SPINE SURGERY  April 2013   Back X's 4   TOTAL HIP ARTHROPLASTY     right   WRIST FRACTURE SURGERY Bilateral     Allergies  Allergen Reactions   Adrenalone    Lactose Other (See Comments)    abd pain, lactose intolerant   Morphine Other (See Comments)   Sulfa Antibiotics Other (See Comments)    Headache, very sick   Codeine Other (See Comments)    Reaction:  Headaches and nightmares    Lactose Intolerance (Gi) Nausea And Vomiting   Latex Rash   Lyrica [Pregabalin] Swelling and Other (See Comments)    Reaction:  Leg swelling   Other Other (See Comments)    Pt states that pain medications give her nightmares.     Plaquenil [Hydroxychloroquine Sulfate] Other (See Comments)    Reaction:  GI upset    Reglan [Metoclopramide] Other (See Comments)    Reaction:  GI upset    Requip [Ropinirole Hcl] Other (See Comments)    Reaction:  GI upset    Septra [Sulfamethoxazole-Trimethoprim] Nausea And Vomiting   Shellfish Allergy Nausea And Vomiting    Outpatient Encounter Medications as of 12/22/2021  Medication Sig   acetaminophen  (TYLENOL) 500 MG tablet Take 500 mg by mouth See admin instructions. Take 1 tablet (500 mg) by mouth scheduled 3 times daily scheduled & 3 times daily as needed for pain (Not to exceed 3g/24 hrs)   aspirin 81 MG chewable tablet Chew 81 mg by mouth daily.   Calcium Carb-Cholecalciferol (CALCIUM 600/VITAMIN D3) 600-800 MG-UNIT TABS Take 1 tablet by mouth daily.   carbidopa-levodopa (SINEMET IR) 25-100 MG tablet Take 1 tablet by mouth 3 (three) times daily.   Carbidopa-Levodopa ER (SINEMET CR) 25-100 MG tablet controlled release Take 1 tablet by mouth at bedtime.   Cholecalciferol (VITAMIN D) 50 MCG (2000 UT) tablet Take 2,000 Units by mouth daily.   docusate sodium (COLACE) 100 MG capsule Take 100 mg by mouth at bedtime.   famotidine (PEPCID) 20 MG tablet Take 20 mg by mouth daily.   Ferrous Sulfate (SLOW FE) 142 (45 Fe) MG TBCR Take 142 mg by mouth in the morning.   furosemide (LASIX) 20 MG tablet Take 1 tablet (20 mg total) by mouth daily.   hydroxychloroquine (PLAQUENIL) 200 MG tablet Take 200 mg by mouth daily.   lactose free nutrition (BOOST) LIQD Take by mouth 2 (two) times daily. 1/2 box; Resident likes chocolate. Twice A Day   levothyroxine (SYNTHROID, LEVOTHROID) 50 MCG tablet Take 50 mcg by mouth daily before breakfast.    lubiprostone (AMITIZA) 24 MCG capsule Take 24 mcg by mouth 2 (two) times daily with a meal.    Multiple Vitamin (MULTIVITAMIN WITH MINERALS) TABS tablet Take 1 tablet by mouth daily.   Multiple Vitamins-Minerals (PRESERVISION AREDS 2 PO) Take 1 tablet by mouth daily.   nystatin (MYCOSTATIN/NYSTOP) powder Apply topically 2 (two) times daily as needed.   pantoprazole (PROTONIX) 40 MG tablet Take 40 mg by mouth daily.   Polyethyl Glycol-Propyl Glycol (SYSTANE) 0.4-0.3 % SOLN Apply 1 drop to eye 2 (two) times daily as needed (dry eyes).    potassium citrate (UROCIT-K) 10 MEQ (1080 MG) SR tablet Take 10 mEq by mouth 2 (two) times daily.   pramipexole (MIRAPEX) 0.125 MG  tablet Take 1 tablet (0.125 mg total)  by mouth at bedtime.   QUEtiapine (SEROQUEL) 25 MG tablet Take 12.5-25 mg by mouth See admin instructions. Take 0.5 tablet (12.5 mg) by mouth in the morning & take 1 tablet (25 mg) by mouth in the evening.   rosuvastatin (CRESTOR) 20 MG tablet Take 10 mg by mouth daily.   saccharomyces boulardii (FLORASTOR) 250 MG capsule Take 250 mg by mouth daily.   senna-docusate (SENOKOT-S) 8.6-50 MG tablet Take 1 tablet by mouth at bedtime.    sertraline (ZOLOFT) 50 MG tablet Take 100 mg by mouth at bedtime.   Skin Protectants, Misc. (EUCERIN) cream Apply 1 application topically daily.   Spacer/Aero-Holding Chambers (AEROCHAMBER MV) inhaler by Other route. Use as instructed as needed   traMADol (ULTRAM) 50 MG tablet Take 0.5 tablets (25 mg total) by mouth 3 (three) times daily as needed.   [DISCONTINUED] Lidocaine (SALONPAS PAIN RELIEVING) 4 % PTCH Apply 1 patch topically. Apply lidocaine patch in the morning and remove at bedtime.   No facility-administered encounter medications on file as of 12/22/2021.    Review of Systems  Constitutional:  Negative for activity change, appetite change and fever.  HENT:  Positive for hearing loss. Negative for congestion and voice change.   Eyes:  Negative for visual disturbance.  Respiratory:  Positive for shortness of breath. Negative for cough.        Chronic DOE  Cardiovascular:  Positive for leg swelling.  Gastrointestinal:  Positive for constipation. Negative for abdominal pain, nausea and vomiting.       A tennis ball sized rectum protrusion after a large hard BM  Genitourinary:  Positive for frequency. Negative for dysuria and urgency.  Musculoskeletal:  Positive for arthralgias, back pain, gait problem and myalgias.       Chronic lower back pain. The R 3rd finger mid knuckle pain is chronic.   Skin:  Negative for color change.  Neurological:  Positive for tremors. Negative for speech difficulty, weakness and  light-headedness.       Memory lapses. Minimal resting tremor in fingers, not disabling. RLS  Psychiatric/Behavioral:  Positive for hallucinations. Negative for behavioral problems and sleep disturbance. The patient is not nervous/anxious.    Immunization History  Administered Date(s) Administered   Hepatitis A, Adult 03/01/1997, 09/20/1997   Influenza Split 09/09/2013, 09/27/2014   Influenza, High Dose Seasonal PF 08/01/2011, 08/27/2012, 10/14/2013, 09/08/2015, 09/10/2016, 09/12/2017, 09/12/2018, 09/15/2019   Influenza,inj,Quad PF,6+ Mos 09/11/2016   Influenza-Unspecified 09/21/2020, 09/28/2021   Moderna Sars-Covid-2 Vaccination 12/12/2019, 01/09/2020, 10/18/2020, 05/09/2021, 08/29/2021   Pneumococcal Conjugate-13 04/15/2014   Pneumococcal Polysaccharide-23 09/28/2005   Td 06/20/2016   Tdap 04/29/2006, 09/01/2015   Zoster Recombinat (Shingrix) 06/11/2018, 09/10/2018   Zoster, Live 04/29/2006   Pertinent  Health Maintenance Due  Topic Date Due   INFLUENZA VACCINE  Completed   DEXA SCAN  Completed   Fall Risk 08/03/2021 08/04/2021 08/09/2021 09/27/2021 10/10/2021  Falls in the past year? 1 1 1  - 1  Was there an injury with Fall? 1 1 1  - 0  Was there an injury with Fall? - - - - -  Fall Risk Category Calculator 2 3 2  - 2  Fall Risk Category Moderate High Moderate - Moderate  Patient Fall Risk Level - High fall risk High fall risk High fall risk Moderate fall risk  Patient at Risk for Falls Due to - - History of fall(s) - -  Patient at Risk for Falls Due to - - - - -  Fall risk Follow up - -  Falls evaluation completed;Education provided - -   Functional Status Survey:    Vitals:   12/22/21 1458  BP: 119/65  Pulse: 70  Resp: 18  Temp: (!) 97.1 F (36.2 C)  SpO2: 95%  Weight: 130 lb 4.8 oz (59.1 kg)  Height: 5\' 2"  (1.575 m)   Body mass index is 23.83 kg/m. Physical Exam Vitals and nursing note reviewed.  Constitutional:      Appearance: Normal appearance.  HENT:      Head: Normocephalic and atraumatic.     Mouth/Throat:     Mouth: Mucous membranes are moist.  Eyes:     Extraocular Movements: Extraocular movements intact.     Conjunctiva/sclera: Conjunctivae normal.     Pupils: Pupils are equal, round, and reactive to light.  Cardiovascular:     Rate and Rhythm: Normal rate and regular rhythm.     Heart sounds: Murmur heard.  Pulmonary:     Effort: Pulmonary effort is normal.     Breath sounds: No rales.  Abdominal:     General: Bowel sounds are normal.     Palpations: Abdomen is soft.     Tenderness: There is no abdominal tenderness.     Comments: Suprapubic area discomfort when palpated.   Genitourinary:    Comments: Reported protruded rectum was reduced on its own, a external hemorrhoid at 7pm, no s/s of injury.  Musculoskeletal:        General: Tenderness present.     Cervical back: Normal range of motion and neck supple.     Right lower leg: Edema present.     Left lower leg: Edema present.     Comments: Trace edema BLE.  Arthritic changes in fingers, R>L. The R 3rd finger mid knuckle swelling, no heat or redness. Chronic lower back pain. Left antecubital area pain with movement and palpation. No redness, swelling, warmth, or mass noted.   Skin:    General: Skin is warm and dry.  Neurological:     General: No focal deficit present.     Mental Status: She is alert. Mental status is at baseline.     Gait: Gait abnormal.     Comments: Oriented to person, place. Ambulates with walker.   Psychiatric:        Mood and Affect: Mood normal.        Behavior: Behavior normal.     Comments: Visual hallucination vs delusion    Labs reviewed: Recent Labs    04/12/21 0005 09/27/21 0720  NA 140 140  K 4.2 4.3  CL 104 108  CO2 25* 24  GLUCOSE  --  90  BUN 18 14  CREATININE 0.9 0.85  CALCIUM 9.8 9.4   Recent Labs    04/12/21 0005  AST 19  ALT 8  ALKPHOS 78  ALBUMIN 4.1   Recent Labs    04/12/21 0005 09/27/21 0720 10/05/21 0000  10/09/21 0000  WBC 6.7 5.0 5.1 5.4  NEUTROABS 4,147.00  --  2,739.00 2,538.00  HGB 11.0* 8.9* 8.0* 8.0*  HCT 34* 29.4* 26* 26*  MCV  --  98.3  --   --   PLT 282 307 253 281   Lab Results  Component Value Date   TSH 1.62 04/12/2021   No results found for: HGBA1C Lab Results  Component Value Date   CHOL 105 10/05/2020   HDL 46 10/05/2020   LDLCALC 38 10/05/2020   TRIG 119 10/05/2020   CHOLHDL 2.4 07/14/2019    Significant Diagnostic Results  in last 30 days:  No results found.  Assessment/Plan Rectal prolapse Per clinical report/presentation, avoid constipation, gentle pressure with palm to reduce if needed.   Chronic constipation  takes Senokot S, Amitiza, Colace, add MiraLax   Hypothyroidism takes Levothyroxine 22mcg qd. TSH 1.62 04/11/21  Erosive (osteo)arthritis takes Plaquenil, Tylenol  f/u Rheumatology.   Chronic diastolic CHF (congestive heart failure) (HCC)  takes Furosemide, cardiology, severe aortic valve stenosis. Echocardiogram 11/15/20 EF 60-65%. Bun/creat 14/0.85 09/27/21  Iron deficiency anemia  takes Fe, Hgb 10.1 11/23/21  Aortic stenosis palliative approach. More noticeable DOE  Depression, psychotic ([place]) , stabilizing, takes Sertraline,  Quetiapine. Change Tramadol to prn helped.   Restless leg syndrome started MiraPex 0.125mg  10/18/21, also takes Sinemet hs, better sleep at night.   Parkinson's disease ([place]) Able to feed self, w/c for mobility most of time,  takes Sinemet, on MiraPex 0.125mg  qhs since 10/18/21  GERD (gastroesophageal reflux disease) takes Pantoprazole 40mg  qd, Famotidine 20mg  qd. yearly FOBT per GI, Hgb 8.0 10/09/21  Renal lithiasis recurrent UTI, urosepsis, treated by ID. 09/19/21 cystoscopy, laser lithotripsy, R ureteral stent/dilatation. Resolved dysuria, decreased urinary frequency.     Family/ staff Communication: plan of care reviewed with the patient and charge nurse.   Labs/tests ordered:  none  Time spend 35  minutes.

## 2021-12-22 NOTE — Assessment & Plan Note (Addendum)
recurrent UTI, urosepsis, treated by ID. 09/19/21 cystoscopy, laser lithotripsy, R ureteral stent/dilatation. Resolved dysuria, decreased urinary frequency.

## 2021-12-22 NOTE — Assessment & Plan Note (Signed)
takes Furosemide, cardiology, severe aortic valve stenosis. Echocardiogram 11/15/20 EF 60-65%. Bun/creat 14/0.85 09/27/21

## 2021-12-22 NOTE — Assessment & Plan Note (Signed)
started MiraPex 0.125mg  10/18/21, also takes Sinemet hs, better sleep at night.

## 2021-12-22 NOTE — Assessment & Plan Note (Signed)
takes Fe, Hgb 10.1 11/23/21

## 2021-12-22 NOTE — Assessment & Plan Note (Signed)
,   stabilizing, takes Sertraline,  Quetiapine. Change Tramadol to prn helped.

## 2021-12-22 NOTE — Assessment & Plan Note (Signed)
takes Senokot S, Amitiza, Colace, add MiraLax

## 2021-12-22 NOTE — Assessment & Plan Note (Signed)
Per clinical report/presentation, avoid constipation, gentle pressure with palm to reduce if needed.

## 2021-12-22 NOTE — Assessment & Plan Note (Signed)
Able to feed self, w/c for mobility most of time,  takes Sinemet, on MiraPex 0.125mg  qhs since 10/18/21

## 2021-12-22 NOTE — Assessment & Plan Note (Signed)
takes Plaquenil, Tylenol  f/u Rheumatology.

## 2021-12-22 NOTE — Assessment & Plan Note (Signed)
takes Levothyroxine 48mcg qd. TSH 1.62 04/11/21

## 2021-12-22 NOTE — Assessment & Plan Note (Signed)
palliative approach. More noticeable DOE

## 2021-12-25 DIAGNOSIS — G20 Parkinson's disease: Secondary | ICD-10-CM | POA: Diagnosis not present

## 2021-12-25 DIAGNOSIS — R296 Repeated falls: Secondary | ICD-10-CM | POA: Diagnosis not present

## 2021-12-25 DIAGNOSIS — M6281 Muscle weakness (generalized): Secondary | ICD-10-CM | POA: Diagnosis not present

## 2021-12-25 DIAGNOSIS — R278 Other lack of coordination: Secondary | ICD-10-CM | POA: Diagnosis not present

## 2021-12-25 DIAGNOSIS — R2681 Unsteadiness on feet: Secondary | ICD-10-CM | POA: Diagnosis not present

## 2021-12-26 DIAGNOSIS — R296 Repeated falls: Secondary | ICD-10-CM | POA: Diagnosis not present

## 2021-12-26 DIAGNOSIS — G20 Parkinson's disease: Secondary | ICD-10-CM | POA: Diagnosis not present

## 2021-12-26 DIAGNOSIS — R278 Other lack of coordination: Secondary | ICD-10-CM | POA: Diagnosis not present

## 2021-12-26 DIAGNOSIS — M6281 Muscle weakness (generalized): Secondary | ICD-10-CM | POA: Diagnosis not present

## 2021-12-26 DIAGNOSIS — R2681 Unsteadiness on feet: Secondary | ICD-10-CM | POA: Diagnosis not present

## 2021-12-28 ENCOUNTER — Other Ambulatory Visit: Payer: Self-pay

## 2021-12-28 ENCOUNTER — Ambulatory Visit (INDEPENDENT_AMBULATORY_CARE_PROVIDER_SITE_OTHER): Payer: Medicare Other | Admitting: Podiatry

## 2021-12-28 DIAGNOSIS — B351 Tinea unguium: Secondary | ICD-10-CM | POA: Diagnosis not present

## 2021-12-28 DIAGNOSIS — R278 Other lack of coordination: Secondary | ICD-10-CM | POA: Diagnosis not present

## 2021-12-28 DIAGNOSIS — M6281 Muscle weakness (generalized): Secondary | ICD-10-CM | POA: Diagnosis not present

## 2021-12-28 DIAGNOSIS — M79675 Pain in left toe(s): Secondary | ICD-10-CM

## 2021-12-28 DIAGNOSIS — G20 Parkinson's disease: Secondary | ICD-10-CM | POA: Diagnosis not present

## 2021-12-28 DIAGNOSIS — R296 Repeated falls: Secondary | ICD-10-CM | POA: Diagnosis not present

## 2021-12-28 DIAGNOSIS — M79674 Pain in right toe(s): Secondary | ICD-10-CM | POA: Diagnosis not present

## 2021-12-28 DIAGNOSIS — R2681 Unsteadiness on feet: Secondary | ICD-10-CM | POA: Diagnosis not present

## 2021-12-31 NOTE — Progress Notes (Signed)
Subjective: 88 y.o. returns the office today for painful, elongated, thickened toenails which she cannot trim herself.  The right big toenail has been causing discomfort but denies any drainage or pus pain swelling to the toenail site.  Denies any open lesions.  No recent injury to her feet she reports.  PCP: [name], MD   Objective: AAO 3, NAD DP/PT pulses palpable, CRT less than 3 seconds Mild chronic bilateral lower extremity edema present. Nails hypertrophic, dystrophic, elongated, brittle, discolored 9. There is tenderness overlying the nails 1-5 on the right and 2 through 5 on the left.   Prominent metatarsal heads plantarly. No open lesions or pre-ulcerative lesions are identified. No significant areas of tenderness today otherwise. No pain with calf compression, warmth, erythema.  Assessment: Patient presents with symptomatic onychomycosis  Plan: -Treatment options including alternatives, risks, complications were discussed -Nails sharply debrided 9 without complication/bleeding.  Discussed if needed right toenail removal but really try to treat this conservatively. -Continue daily foot inspection, general stretching, rehab as well as shoes with good arch support.  Trula Slade DPM

## 2022-01-01 DIAGNOSIS — Z1231 Encounter for screening mammogram for malignant neoplasm of breast: Secondary | ICD-10-CM | POA: Diagnosis not present

## 2022-01-01 DIAGNOSIS — R2681 Unsteadiness on feet: Secondary | ICD-10-CM | POA: Diagnosis not present

## 2022-01-01 DIAGNOSIS — R278 Other lack of coordination: Secondary | ICD-10-CM | POA: Diagnosis not present

## 2022-01-01 DIAGNOSIS — R296 Repeated falls: Secondary | ICD-10-CM | POA: Diagnosis not present

## 2022-01-01 DIAGNOSIS — G20 Parkinson's disease: Secondary | ICD-10-CM | POA: Diagnosis not present

## 2022-01-01 DIAGNOSIS — M6281 Muscle weakness (generalized): Secondary | ICD-10-CM | POA: Diagnosis not present

## 2022-01-02 DIAGNOSIS — R296 Repeated falls: Secondary | ICD-10-CM | POA: Diagnosis not present

## 2022-01-02 DIAGNOSIS — R2681 Unsteadiness on feet: Secondary | ICD-10-CM | POA: Diagnosis not present

## 2022-01-02 DIAGNOSIS — M6281 Muscle weakness (generalized): Secondary | ICD-10-CM | POA: Diagnosis not present

## 2022-01-02 DIAGNOSIS — R278 Other lack of coordination: Secondary | ICD-10-CM | POA: Diagnosis not present

## 2022-01-02 DIAGNOSIS — G20 Parkinson's disease: Secondary | ICD-10-CM | POA: Diagnosis not present

## 2022-01-04 DIAGNOSIS — M6281 Muscle weakness (generalized): Secondary | ICD-10-CM | POA: Diagnosis not present

## 2022-01-04 DIAGNOSIS — R296 Repeated falls: Secondary | ICD-10-CM | POA: Diagnosis not present

## 2022-01-04 DIAGNOSIS — R2681 Unsteadiness on feet: Secondary | ICD-10-CM | POA: Diagnosis not present

## 2022-01-04 DIAGNOSIS — R278 Other lack of coordination: Secondary | ICD-10-CM | POA: Diagnosis not present

## 2022-01-04 DIAGNOSIS — G20 Parkinson's disease: Secondary | ICD-10-CM | POA: Diagnosis not present

## 2022-01-08 ENCOUNTER — Encounter: Payer: Self-pay | Admitting: Nurse Practitioner

## 2022-01-08 NOTE — Progress Notes (Signed)
This encounter was created in error - please disregard.

## 2022-01-09 ENCOUNTER — Encounter: Payer: Self-pay | Admitting: Internal Medicine

## 2022-01-09 ENCOUNTER — Non-Acute Institutional Stay (SKILLED_NURSING_FACILITY): Payer: Medicare Other | Admitting: Internal Medicine

## 2022-01-09 DIAGNOSIS — I350 Nonrheumatic aortic (valve) stenosis: Secondary | ICD-10-CM | POA: Diagnosis not present

## 2022-01-09 DIAGNOSIS — N390 Urinary tract infection, site not specified: Secondary | ICD-10-CM

## 2022-01-09 DIAGNOSIS — G2581 Restless legs syndrome: Secondary | ICD-10-CM

## 2022-01-09 DIAGNOSIS — D508 Other iron deficiency anemias: Secondary | ICD-10-CM | POA: Diagnosis not present

## 2022-01-09 DIAGNOSIS — E039 Hypothyroidism, unspecified: Secondary | ICD-10-CM

## 2022-01-09 DIAGNOSIS — M154 Erosive (osteo)arthritis: Secondary | ICD-10-CM

## 2022-01-09 DIAGNOSIS — F323 Major depressive disorder, single episode, severe with psychotic features: Secondary | ICD-10-CM | POA: Diagnosis not present

## 2022-01-09 DIAGNOSIS — I5032 Chronic diastolic (congestive) heart failure: Secondary | ICD-10-CM | POA: Diagnosis not present

## 2022-01-09 DIAGNOSIS — K5909 Other constipation: Secondary | ICD-10-CM

## 2022-01-09 DIAGNOSIS — G20 Parkinson's disease: Secondary | ICD-10-CM | POA: Diagnosis not present

## 2022-01-09 DIAGNOSIS — R441 Visual hallucinations: Secondary | ICD-10-CM | POA: Diagnosis not present

## 2022-01-09 NOTE — Progress Notes (Signed)
Location:  [facility] Room Number: 45 Place of Service:  SNF (31)  Provider: [name]   Code Status: DNR Goals of Care:  Advanced Directives 01/09/2022  Does Patient Have a Medical Advance Directive? Yes  Type of Advance Directive Out of facility DNR (pink MOST or yellow form);Living will;Healthcare Power of Attorney  Does patient want to make changes to medical advance directive? No - Patient declined  Copy of [place] in Chart? Yes - validated most recent copy scanned in chart (See row information)  Would patient like information on creating a medical advance directive? -  Pre-existing out of facility DNR order (yellow form or pink MOST form) Yellow form placed in chart (order not valid for inpatient use)     Chief Complaint  Patient presents with   Medical Management of Chronic Issues   Quality Metric Gaps    Patient has had 5 covid-19 vaccinations    HPI: Patient is a 88 y.o. female seen today for medical management of chronic diseases.   SNF resident Seen in her room with her husband  Patient has  History of renal stones with recurrent UTI  Underwent Cystoscopy with right ureteroscopy, holmium laser lithotripsy and right ureteral stent placement on 10/22 Doing well. Has h/o Pseudomonas and has seen ID   History of erosive arthritis on Plaquenil and follows with Dr. Lenna Gilford  Parkinson disease with hallucinations.  Failed taper of Sinemet. Back on it now Also failed taper of Mirapex On Lower dose now  Severe AS refused surgery per cardiology palliative approach  Also has h/o Peripheral Neuropathy, hypothyroid, GERD, osteoporosis, hyperlipidemia, gout, Severe Constipation with Rectal prolapse  She  is stable.No new Nursing issues. No Behavior issues Hallucinations once or twice a week Which is better for her Cant do her transfers. Can walk wit the walker with Mild assist Her weight is stable Wt Readings from Last 3  Encounters:  01/09/22 132 lb 12.8 oz (60.2 kg)  01/08/22 133 lb 4.8 oz (60.5 kg)  12/22/21 130 lb 4.8 oz (59.1 kg)  Does get tired and SOB easily Struggles with her mood alot  Past Medical History:  Diagnosis Date   Abnormal nuclear stress test    Actinic cheilitis 04/23/2019   Anemia    iron deficiency    Anxiety    Aortic stenosis    severe by echo 2021    Arthritis    Broken arm    right    Chronic diastolic CHF (congestive heart failure) ([place])    Chronic pain    Cognitive communication deficit    Cognitive deficits    Communication   Constipation    Depression    Erosive (osteo)arthritis 03/09/2020   Family history of adverse reaction to anesthesia    pt. states sister vomits   Fibromyalgia    GERD (gastroesophageal reflux disease)    H/O hiatal hernia    Hallucinations, visual 07/21/2018   03/10/20 psych consult.    Hematuria 05/18/2020   History of bronchitis    History of kidney stones    Hypercholesterolemia    Hypertension    dr t turner   Hypothyroidism    Memory loss 04/21/2018   08/08/18 CT head no traumatic findings, atrophy.  09/27/19 MMSE 25/30, failed the clock drawing   Microhematuria 03/24/2015   Neuropathy    Osteoporosis    Parkinson disease ([place])    PVD (peripheral vascular disease) ([facility])    99% stenosis  of left anteiror tibial artery, mod stenosis of left distal SFA and popliteal artery followed by [name]   RBBB    noted on EKG 2018   Restless leg syndrome    Sepsis ([place])    hx of due to [facility]   Septic shock ([place]) 04/19/2020   Shortness of breath    with exertion - chronic    Sjogren's disease ([place])    Sjogren's syndrome ([place]) 02/04/2018   04/16/19 rheumatology: Erosive OA of hands, Sjogrens syndrome, low back pain at multiple sites, recurrent kidney stones, age related osteoporosis w/o current pathological fracture, f/u 6 months.    SOB (shortness of breath)    chronic due to diastolic dysfunction, deconditioning, obesity   Spinal  stenosis    lumbar region    Tremor    Unstable gait 04/21/2018   Unsteadiness on feet    Urinary frequency 09/18/2018   Urinary tract infection    Visual hallucinations     Past Surgical History:  Procedure Laterality Date   ABDOMINAL HYSTERECTOMY     BACK SURGERY     4 back surgeries,   lumbar fusion   CARDIAC CATHETERIZATION  2006   normal   CYSTOSCOPY/URETEROSCOPY/HOLMIUM LASER/STENT PLACEMENT Left 01/06/2019   Procedure: CYSTOSCOPY/RETROGRADE/URETEROSCOPY/HOLMIUM LASER/BASKET RETRIEVAL/STENT PLACEMENT;  Surgeon: Ceasar Mons, MD;  Location: [facility];  Service: Urology;  Laterality: Left;   CYSTOSCOPY/URETEROSCOPY/HOLMIUM LASER/STENT PLACEMENT Bilateral 05/24/2020   Procedure: CYSTOSCOPY/RETROGRADE/URETEROSCOPY/HOLMIUM LASER/STENT PLACEMENT;  Surgeon: Ceasar Mons, MD;  Location: [facility];  Service: Urology;  Laterality: Bilateral;   CYSTOSCOPY/URETEROSCOPY/HOLMIUM LASER/STENT PLACEMENT Right 09/27/2021   Procedure: CYSTOSCOPY/BILATERAL RETROGRADE/URETEROSCOPY/HOLMIUM LASER/STENT PLACEMENT;  Surgeon: Ceasar Mons, MD;  Location: [facility];  Service: Urology;  Laterality: Right;  ONLY NEEDS 60 MIN   EYE SURGERY Bilateral    cateracts   falls     variious fall, broken wrist,and toes   FRACTURE SURGERY Right April 2016   Wrist, Pt. fell   IR NEPHROSTOMY PLACEMENT RIGHT  04/19/2020   kidney stone removal Left 01/20/2019   RIGHT/LEFT HEART CATH AND CORONARY ANGIOGRAPHY N/A 02/05/2018   Procedure: RIGHT/LEFT HEART CATH AND CORONARY ANGIOGRAPHY;  Surgeon: Troy Sine, MD;  Location: [facility];  Service: Cardiovascular;  Laterality: N/A;   SPINAL CORD STIMULATOR INSERTION N/A 09/09/2015   Procedure: LUMBAR SPINAL CORD STIMULATOR INSERTION;  Surgeon: Clydell Hakim, MD;  Location: [facility];  Service: Neurosurgery;  Laterality: N/A;  LUMBAR SPINAL CORD STIMULATOR INSERTION   SPINE SURGERY  April 2013   Back X's 4   TOTAL HIP ARTHROPLASTY      right   WRIST FRACTURE SURGERY Bilateral     Allergies  Allergen Reactions   Adrenalone    Lactose Other (See Comments)    abd pain, lactose intolerant   Morphine Other (See Comments)   Sulfa Antibiotics Other (See Comments)    Headache, very sick   Codeine Other (See Comments)    Reaction:  Headaches and nightmares    Lactose Intolerance (Gi) Nausea And Vomiting   Latex Rash   Lyrica [Pregabalin] Swelling and Other (See Comments)    Reaction:  Leg swelling   Other Other (See Comments)    Pt states that pain medications give her nightmares.     Plaquenil [Hydroxychloroquine Sulfate] Other (See Comments)    Reaction:  GI upset    Reglan [Metoclopramide] Other (See Comments)    Reaction:  GI upset    Requip [Ropinirole Hcl] Other (See Comments)    Reaction:  GI upset    Septra [Sulfamethoxazole-Trimethoprim] Nausea And Vomiting   Shellfish Allergy Nausea And Vomiting    Outpatient Encounter Medications as of 01/09/2022  Medication Sig   acetaminophen (TYLENOL) 500 MG tablet Take 500 mg by mouth See admin instructions. Take 1 tablet (500 mg) by mouth scheduled 3 times daily scheduled & 3 times daily as needed for pain (Not to exceed 3g/24 hrs)   aspirin 81 MG chewable tablet Chew 81 mg by mouth daily.   Calcium Carb-Cholecalciferol (CALCIUM 600/VITAMIN D3) 600-800 MG-UNIT TABS Take 1 tablet by mouth daily.   carbidopa-levodopa (SINEMET IR) 25-100 MG tablet Take 1 tablet by mouth 3 (three) times daily.   Carbidopa-Levodopa ER (SINEMET CR) 25-100 MG tablet controlled release Take 1 tablet by mouth at bedtime.   Cholecalciferol (VITAMIN D) 50 MCG (2000 UT) tablet Take 2,000 Units by mouth daily.   docusate sodium (COLACE) 100 MG capsule Take 100 mg by mouth at bedtime.   famotidine (PEPCID) 20 MG tablet Take 20 mg by mouth daily.   Ferrous Sulfate (SLOW FE) 142 (45 Fe) MG TBCR Take 142 mg by mouth in the morning.   furosemide (LASIX) 20 MG tablet Take 1 tablet (20 mg total) by mouth  daily.   hydroxychloroquine (PLAQUENIL) 200 MG tablet Take 200 mg by mouth daily.   lactose free nutrition (BOOST) LIQD Take by mouth 2 (two) times daily. 1/2 box; Resident likes chocolate. Twice A Day   levothyroxine (SYNTHROID, LEVOTHROID) 50 MCG tablet Take 50 mcg by mouth daily before breakfast.    lubiprostone (AMITIZA) 24 MCG capsule Take 24 mcg by mouth 2 (two) times daily with a meal.    Multiple Vitamin (MULTIVITAMIN WITH MINERALS) TABS tablet Take 1 tablet by mouth daily.   Multiple Vitamins-Minerals (PRESERVISION AREDS 2 PO) Take 1 tablet by mouth daily.   nystatin (MYCOSTATIN/NYSTOP) powder Apply topically 2 (two) times daily as needed.   pantoprazole (PROTONIX) 40 MG tablet Take 40 mg by mouth daily.   Polyethyl Glycol-Propyl Glycol (SYSTANE) 0.4-0.3 % SOLN Apply 1 drop to eye 2 (two) times daily as needed (dry eyes).    polyethylene glycol (MIRALAX / GLYCOLAX) 17 g packet Take 17 g by mouth daily.   potassium citrate (UROCIT-K) 10 MEQ (1080 MG) SR tablet Take 10 mEq by mouth 2 (two) times daily.   pramipexole (MIRAPEX) 0.125 MG tablet Take 1 tablet (0.125 mg total) by mouth at bedtime.   QUEtiapine (SEROQUEL) 25 MG tablet Take 25 mg by mouth See admin instructions.   QUEtiapine (SEROQUEL) 25 MG tablet Take 12.5 mg by mouth every morning.   rosuvastatin (CRESTOR) 20 MG tablet Take 10 mg by mouth daily.   saccharomyces boulardii (FLORASTOR) 250 MG capsule Take 250 mg by mouth daily.   senna-docusate (SENOKOT-S) 8.6-50 MG tablet Take 1 tablet by mouth at bedtime.    sertraline (ZOLOFT) 50 MG tablet Take 100 mg by mouth at bedtime.   Skin Protectants, Misc. (EUCERIN) cream Apply 1 application topically daily.   Spacer/Aero-Holding Chambers (AEROCHAMBER MV) inhaler by Other route. Use as instructed as needed   traMADol (ULTRAM) 50 MG tablet Take 0.5 tablets (25 mg total) by mouth 3 (three) times daily as needed.   No facility-administered encounter medications on file as of  01/09/2022.    Review of Systems:  Review of Systems  Constitutional:  Negative for activity change and appetite change.  HENT: Negative.    Respiratory:  Positive for shortness of breath. Negative for cough.   Cardiovascular:  Positive for leg swelling.  Gastrointestinal:  Positive for constipation.  Genitourinary: Negative.   Musculoskeletal:  Positive for gait problem. Negative for arthralgias and myalgias.  Skin: Negative.   Neurological:  Positive for weakness. Negative for dizziness.  Psychiatric/Behavioral:  Positive for confusion and hallucinations. Negative for dysphoric mood and sleep disturbance.    Health Maintenance  Topic Date Due   COVID-19 Vaccine (6 - Booster for Moderna series) 10/24/2021   TETANUS/TDAP  06/20/2026   Pneumonia Vaccine 70+ Years old  Completed   INFLUENZA VACCINE  Completed   DEXA SCAN  Completed   Zoster Vaccines- Shingrix  Completed   HPV VACCINES  Aged Out    Physical Exam: Vitals:   01/09/22 1522  BP: 110/68  Pulse: 68  Resp: 18  Temp: 97.9 F (36.6 C)  SpO2: 96%  Weight: 132 lb 12.8 oz (60.2 kg)  Height: 5\' 2"  (1.575 m)   Body mass index is 24.29 kg/m. Physical Exam Vitals reviewed.  Constitutional:      Appearance: Normal appearance.  HENT:     Head: Normocephalic.     Nose: Nose normal.     Mouth/Throat:     Mouth: Mucous membranes are moist.     Pharynx: Oropharynx is clear.  Eyes:     Pupils: Pupils are equal, round, and reactive to light.  Cardiovascular:     Rate and Rhythm: Normal rate and regular rhythm.     Pulses: Normal pulses.     Heart sounds: Murmur heard.  Pulmonary:     Effort: Pulmonary effort is normal.     Breath sounds: Normal breath sounds.  Abdominal:     General: Abdomen is flat. Bowel sounds are normal.     Palpations: Abdomen is soft.  Musculoskeletal:        General: Swelling present.     Cervical back: Neck supple.  Skin:    General: Skin is warm.  Neurological:     General: No focal  deficit present.     Mental Status: She is alert and oriented to person, place, and time.  Psychiatric:        Mood and Affect: Mood normal.        Thought Content: Thought content normal.    Labs reviewed: Basic Metabolic Panel: Recent Labs    04/12/21 0005 09/27/21 0720  NA 140 140  K 4.2 4.3  CL 104 108  CO2 25* 24  GLUCOSE  --  90  BUN 18 14  CREATININE 0.9 0.85  CALCIUM 9.8 9.4  TSH 1.62  --    Liver Function Tests: Recent Labs    04/12/21 0005  AST 19  ALT 8  ALKPHOS 78  ALBUMIN 4.1   No results for input(s): LIPASE, AMYLASE in the last 8760 hours. No results for input(s): AMMONIA in the last 8760 hours. CBC: Recent Labs    04/12/21 0005 09/27/21 0720 10/05/21 0000 10/09/21 0000 11/23/21 0000  WBC 6.7 5.0 5.1 5.4 6.9  NEUTROABS 4,147.00  --  2,739.00 2,538.00  --   HGB 11.0* 8.9* 8.0* 8.0* 10.1*  HCT 34* 29.4* 26* 26* 31*  MCV  --  98.3  --   --   --   PLT 282 307 253 281 268   Lipid Panel: No results for input(s): CHOL, HDL, LDLCALC, TRIG, CHOLHDL, LDLDIRECT in the last 8760 hours. No results found for: HGBA1C  Procedures since last visit: No results found.  Assessment/Plan 1. Parkinson's disease ([place]) On Sinemet Failed the  Taper Follows with DR Tat  2. Nonrheumatic aortic valve stenosis Palliative Approach Per Cardiology Continues to have Fatigue and SOB  3. Acquired hypothyroidism Repeat TSH  4. Chronic diastolic CHF (congestive heart failure) ([facility]) Stable On Lasix  5. Iron deficiency anemia secondary to inadequate dietary iron intake HGB good on iron Repeat Labs  6. Chronic constipation Doing well with Amitiza and Miralax  7. Erosive (osteo)arthritis On Plaquenil per Dr Milly Jakob  8. Restless leg syndrome On Mirapex  9. Depression, psychotic ([place]) Referal to Counseling per her request On Zoloft 10. Recurrent UTI Continues to be her issue  11. Hallucinations, visual Doing well on Low dose of Seroquel  D/C  Tramadol   Labs/tests ordered:  CBC,CMP,TSH Lipid  [name], MD

## 2022-01-11 DIAGNOSIS — E039 Hypothyroidism, unspecified: Secondary | ICD-10-CM | POA: Diagnosis not present

## 2022-01-11 DIAGNOSIS — D649 Anemia, unspecified: Secondary | ICD-10-CM | POA: Diagnosis not present

## 2022-01-11 DIAGNOSIS — I10 Essential (primary) hypertension: Secondary | ICD-10-CM | POA: Diagnosis not present

## 2022-01-11 DIAGNOSIS — E785 Hyperlipidemia, unspecified: Secondary | ICD-10-CM | POA: Diagnosis not present

## 2022-01-11 LAB — BASIC METABOLIC PANEL
BUN: 20 (ref 4–21)
CO2: 24 — AB (ref 13–22)
Chloride: 106 (ref 99–108)
Creatinine: 1 (ref 0.5–1.1)
Glucose: 78
Potassium: 4.4 (ref 3.4–5.3)
Sodium: 141 (ref 137–147)

## 2022-01-11 LAB — COMPREHENSIVE METABOLIC PANEL
Albumin: 3.8 (ref 3.5–5.0)
Calcium: 9.5 (ref 8.7–10.7)
Globulin: 2.1

## 2022-01-11 LAB — HEPATIC FUNCTION PANEL: Bilirubin, Total: 0.3

## 2022-01-11 LAB — LIPID PANEL
Cholesterol: 137 (ref 0–200)
HDL: 47 (ref 35–70)
LDL Cholesterol: 64
Triglycerides: 184 — AB (ref 40–160)

## 2022-01-11 LAB — TSH: TSH: 1.67 (ref 0.41–5.90)

## 2022-01-14 ENCOUNTER — Encounter: Payer: Self-pay | Admitting: Internal Medicine

## 2022-01-18 ENCOUNTER — Encounter: Payer: Self-pay | Admitting: Nurse Practitioner

## 2022-01-18 ENCOUNTER — Non-Acute Institutional Stay (SKILLED_NURSING_FACILITY): Payer: Medicare Other | Admitting: Nurse Practitioner

## 2022-01-18 DIAGNOSIS — I10 Essential (primary) hypertension: Secondary | ICD-10-CM

## 2022-01-18 DIAGNOSIS — G20 Parkinson's disease: Secondary | ICD-10-CM

## 2022-01-18 DIAGNOSIS — N200 Calculus of kidney: Secondary | ICD-10-CM | POA: Diagnosis not present

## 2022-01-18 DIAGNOSIS — F323 Major depressive disorder, single episode, severe with psychotic features: Secondary | ICD-10-CM | POA: Diagnosis not present

## 2022-01-18 DIAGNOSIS — G2581 Restless legs syndrome: Secondary | ICD-10-CM

## 2022-01-18 DIAGNOSIS — I350 Nonrheumatic aortic (valve) stenosis: Secondary | ICD-10-CM

## 2022-01-18 DIAGNOSIS — M154 Erosive (osteo)arthritis: Secondary | ICD-10-CM

## 2022-01-18 DIAGNOSIS — K5909 Other constipation: Secondary | ICD-10-CM

## 2022-01-18 DIAGNOSIS — K219 Gastro-esophageal reflux disease without esophagitis: Secondary | ICD-10-CM

## 2022-01-18 DIAGNOSIS — R110 Nausea: Secondary | ICD-10-CM

## 2022-01-18 DIAGNOSIS — E039 Hypothyroidism, unspecified: Secondary | ICD-10-CM

## 2022-01-18 DIAGNOSIS — I5032 Chronic diastolic (congestive) heart failure: Secondary | ICD-10-CM | POA: Diagnosis not present

## 2022-01-18 DIAGNOSIS — K623 Rectal prolapse: Secondary | ICD-10-CM

## 2022-01-18 DIAGNOSIS — D508 Other iron deficiency anemias: Secondary | ICD-10-CM

## 2022-01-18 NOTE — Assessment & Plan Note (Signed)
trace edema BLE, takes Furosemide, cardiology, severe aortic valve stenosis. Echocardiogram 11/15/20 EF 60-65%. Bun/creat 14/0.85 09/27/21

## 2022-01-18 NOTE — Assessment & Plan Note (Signed)
Chronic,  takes Plaquenil, Tylenol  f/u Rheumatology.

## 2022-01-18 NOTE — Assessment & Plan Note (Signed)
takes Fe, Hgb 9.9 01/11/22

## 2022-01-18 NOTE — Assessment & Plan Note (Signed)
takes Levothyroxine 21mcg qd. TSH 1.67 01/11/22

## 2022-01-18 NOTE — Assessment & Plan Note (Signed)
palliative approach. More noticeable DOE, fatigue.

## 2022-01-18 NOTE — Assessment & Plan Note (Signed)
stable on MiraPex, Sinemet hs, better sleep at night.  

## 2022-01-18 NOTE — Assessment & Plan Note (Signed)
Hx of rena lithiasis, recurrent UTI, urosepsis, treated by ID. 09/19/21 cystoscopy, laser lithotripsy, R ureteral stent/dilatation. Resolved dysuria, decreased urinary frequency.

## 2022-01-18 NOTE — Assessment & Plan Note (Signed)
Stable,  takes Senokot S, Amitiza, Colace, MiraLax  

## 2022-01-18 NOTE — Assessment & Plan Note (Signed)
stabilizing, takes Sertraline,  Quetiapine. Change Tramadol to prn helped.

## 2022-01-18 NOTE — Assessment & Plan Note (Signed)
takes Pantoprazole 40mg  qd, Famotidine 20mg  qd. yearly FOBT per GI, Hgb 9.9 01/11/22

## 2022-01-18 NOTE — Assessment & Plan Note (Signed)
avoid constipation, usually reduced on its own.  

## 2022-01-18 NOTE — Assessment & Plan Note (Signed)
c/o nausea since breakfast, had BM, denied abd pain, dysuria, cough, or fever/chills. Will try Phenergan 12.5mg  q6h prn x 24, observe

## 2022-01-18 NOTE — Assessment & Plan Note (Signed)
takes Sinemet, MiraPex f/u Dr. Carles Collet

## 2022-01-18 NOTE — Progress Notes (Signed)
Location:   SNF [place] Room Number: 45 Place of Service:  SNF (31) Provider: [facility] Telecia Larocque NP  [name], MD  Patient Care Team: [name], MD as PCP - General (Internal Medicine) [name], MD as PCP - Cardiology (Cardiology) [name], MD (Neurosurgery) [name], MD (Neurology) [name], MD (Inactive) as Consulting Physician (Gastroenterology) [name], MD as Consulting Physician (Urology) Kerby Hockley X, NP as Nurse Practitioner (Internal Medicine) Tat, Eustace Quail, DO as Consulting Physician (Neurology)  Extended Emergency Contact Information Primary Emergency Contact: [facility] Address: [address]          [place], [place] [address] [name] of [place] Phone: [phone] Mobile Phone: [phone] Relation: Spouse Secondary Emergency Contact: [name] Mobile Phone: [phone] Relation: Daughter Preferred language: [name] Interpreter needed? No  Code Status: DNR Goals of care: Advanced Directive information Advanced Directives 01/09/2022  Does Patient Have a Medical Advance Directive? Yes  Type of Advance Directive Out of facility DNR (pink MOST or yellow form);Living will;Healthcare Power of Attorney  Does patient want to make changes to medical advance directive? No - Patient declined  Copy of [place] in Chart? Yes - validated most recent copy scanned in chart (See row information)  Would patient like information on creating a medical advance directive? -  Pre-existing out of facility DNR order (yellow form or pink MOST form) Yellow form placed in chart (order not valid for inpatient use)     Chief Complaint  Patient presents with   Acute Visit    nausea    HPI:  Pt is a 88 y.o. female seen today for an acute visit for c/o nausea since breakfast, had BM, denied abd pain, dysuria, cough, or fever/chills   Prolapsed rectum, avoid constipation, usually  reduced on its own.              Hx of rena lithiasis, recurrent UTI, urosepsis, treated by ID. 09/19/21 cystoscopy, laser lithotripsy, R ureteral stent/dilatation. Resolved dysuria, decreased urinary frequency.              GERD, takes Pantoprazole 40mg  qd, Famotidine 20mg  qd. yearly FOBT per GI, Hgb 9.9 01/11/22             Parkinson's, takes Sinemet, MiraPex f/u Dr. Carles Collet             RLS stable on MiraPex, Sinemet hs, better sleep at night.              Depression/hallucination, stabilizing, takes Sertraline,  Quetiapine. Change Tramadol to prn helped.              Constipation, takes Senokot S, Amitiza, Colace, MiraLax              Hypothyroidism, takes Levothyroxine 19mcg qd. TSH 1.67 01/11/22             Erosive arthritis, takes Plaquenil, Tylenol  f/u Rheumatology.              CHF/trace edema BLE, takes Furosemide, cardiology, severe aortic valve stenosis. Echocardiogram 11/15/20 EF 60-65%. Bun/creat 14/0.85 09/27/21             Anemia, takes Fe, Hgb 9.9 01/11/22             AS Cardiology palliative approach. More noticeable DOE, fatigue.  Past Medical History:  Diagnosis Date   Abnormal nuclear stress test    Actinic cheilitis 04/23/2019   Anemia    iron  deficiency    Anxiety    Aortic stenosis    severe by echo 2021    Arthritis    Broken arm    right    Chronic diastolic CHF (congestive heart failure) ([place])    Chronic pain    Cognitive communication deficit    Cognitive deficits    Communication   Constipation    Depression    Erosive (osteo)arthritis 03/09/2020   Family history of adverse reaction to anesthesia    pt. states sister vomits   Fibromyalgia    GERD (gastroesophageal reflux disease)    H/O hiatal hernia    Hallucinations, visual 07/21/2018   03/10/20 psych consult.    Hematuria 05/18/2020   History of bronchitis    History of kidney stones    Hypercholesterolemia    Hypertension    dr t turner   Hypothyroidism    Memory loss 04/21/2018   08/08/18 CT head no  traumatic findings, atrophy.  09/27/19 MMSE 25/30, failed the clock drawing   Microhematuria 03/24/2015   Neuropathy    Osteoporosis    Parkinson disease (HCC)    PVD (peripheral vascular disease) (HCC)    99% stenosis of left anteiror tibial artery, mod stenosis of left distal SFA and popliteal artery followed by [name]   RBBB    noted on EKG 2018   Restless leg syndrome    Sepsis ([place])    hx of due to Ecoli   Septic shock ([place]) 04/19/2020   Shortness of breath    with exertion - chronic    Sjogren's disease ([place])    Sjogren's syndrome ([place]) 02/04/2018   04/16/19 rheumatology: Erosive OA of hands, Sjogrens syndrome, low back pain at multiple sites, recurrent kidney stones, age related osteoporosis w/o current pathological fracture, f/u 6 months.    SOB (shortness of breath)    chronic due to diastolic dysfunction, deconditioning, obesity   Spinal stenosis    lumbar region    Tremor    Unstable gait 04/21/2018   Unsteadiness on feet    Urinary frequency 09/18/2018   Urinary tract infection    Visual hallucinations    Past Surgical History:  Procedure Laterality Date   ABDOMINAL HYSTERECTOMY     BACK SURGERY     4 back surgeries,   lumbar fusion   CARDIAC CATHETERIZATION  2006   normal   CYSTOSCOPY/URETEROSCOPY/HOLMIUM LASER/STENT PLACEMENT Left 01/06/2019   Procedure: CYSTOSCOPY/RETROGRADE/URETEROSCOPY/HOLMIUM LASER/BASKET RETRIEVAL/STENT PLACEMENT;  Surgeon: Ceasar Mons, MD;  Location: [facility];  Service: Urology;  Laterality: Left;   CYSTOSCOPY/URETEROSCOPY/HOLMIUM LASER/STENT PLACEMENT Bilateral 05/24/2020   Procedure: CYSTOSCOPY/RETROGRADE/URETEROSCOPY/HOLMIUM LASER/STENT PLACEMENT;  Surgeon: Ceasar Mons, MD;  Location: [facility];  Service: Urology;  Laterality: Bilateral;   CYSTOSCOPY/URETEROSCOPY/HOLMIUM LASER/STENT PLACEMENT Right 09/27/2021   Procedure: CYSTOSCOPY/BILATERAL RETROGRADE/URETEROSCOPY/HOLMIUM LASER/STENT PLACEMENT;  Surgeon: Ceasar Mons, MD;  Location: [facility];  Service: Urology;  Laterality: Right;  ONLY NEEDS 60 MIN   EYE SURGERY Bilateral    cateracts   falls     variious fall, broken wrist,and toes   FRACTURE SURGERY Right April 2016   Wrist, Pt. fell   IR NEPHROSTOMY PLACEMENT RIGHT  04/19/2020   kidney stone removal Left 01/20/2019   RIGHT/LEFT HEART CATH AND CORONARY ANGIOGRAPHY N/A 02/05/2018   Procedure: RIGHT/LEFT HEART CATH AND CORONARY ANGIOGRAPHY;  Surgeon: Troy Sine, MD;  Location: [place] [facility];  Service: Cardiovascular;  Laterality: N/A;   SPINAL CORD STIMULATOR INSERTION N/A 09/09/2015   Procedure: LUMBAR SPINAL  CORD STIMULATOR INSERTION;  Surgeon: Clydell Hakim, MD;  Location: [place] [facility];  Service: Neurosurgery;  Laterality: N/A;  LUMBAR SPINAL CORD STIMULATOR INSERTION   SPINE SURGERY  April 2013   Back X's 4   TOTAL HIP ARTHROPLASTY     right   WRIST FRACTURE SURGERY Bilateral     Allergies  Allergen Reactions   Adrenalone    Lactose Other (See Comments)    abd pain, lactose intolerant   Morphine Other (See Comments)   Sulfa Antibiotics Other (See Comments)    Headache, very sick   Codeine Other (See Comments)    Reaction:  Headaches and nightmares    Lactose Intolerance (Gi) Nausea And Vomiting   Latex Rash   Lyrica [Pregabalin] Swelling and Other (See Comments)    Reaction:  Leg swelling   Other Other (See Comments)    Pt states that pain medications give her nightmares.     Plaquenil [Hydroxychloroquine Sulfate] Other (See Comments)    Reaction:  GI upset    Reglan [Metoclopramide] Other (See Comments)    Reaction:  GI upset    Requip [Ropinirole Hcl] Other (See Comments)    Reaction:  GI upset    Septra [Sulfamethoxazole-Trimethoprim] Nausea And Vomiting   Shellfish Allergy Nausea And Vomiting    Allergies as of 01/18/2022       Reactions   Adrenalone    Lactose Other (See Comments)   abd pain, lactose intolerant   Morphine Other (See Comments)    Sulfa Antibiotics Other (See Comments)   Headache, very sick   Codeine Other (See Comments)   Reaction:  Headaches and nightmares    Lactose Intolerance (gi) Nausea And Vomiting   Latex Rash   Lyrica [pregabalin] Swelling, Other (See Comments)   Reaction:  Leg swelling   Other Other (See Comments)   Pt states that pain medications give her nightmares.     Plaquenil [hydroxychloroquine Sulfate] Other (See Comments)   Reaction:  GI upset    Reglan [metoclopramide] Other (See Comments)   Reaction:  GI upset    Requip [ropinirole Hcl] Other (See Comments)   Reaction:  GI upset    Septra [sulfamethoxazole-trimethoprim] Nausea And Vomiting   Shellfish Allergy Nausea And Vomiting        Medication List        Accurate as of January 18, 2022 11:59 PM. If you have any questions, ask your nurse or doctor.          acetaminophen 500 MG tablet Commonly known as: TYLENOL Take 500 mg by mouth See admin instructions. Take 1 tablet (500 mg) by mouth scheduled 3 times daily scheduled & 3 times daily as needed for pain (Not to exceed 3g/24 hrs)   AeroChamber MV inhaler by Other route. Use as instructed as needed   aspirin 81 MG chewable tablet Chew 81 mg by mouth daily.   Calcium 600/Vitamin D3 600-20 MG-MCG Tabs Generic drug: Calcium Carb-Cholecalciferol Take 1 tablet by mouth daily.   carbidopa-levodopa 25-100 MG tablet Commonly known as: SINEMET IR Take 1 tablet by mouth 3 (three) times daily.   Carbidopa-Levodopa ER 25-100 MG tablet controlled release Commonly known as: SINEMET CR Take 1 tablet by mouth at bedtime.   docusate sodium 100 MG capsule Commonly known as: COLACE Take 100 mg by mouth at bedtime.   eucerin cream Apply 1 application topically daily.   famotidine 20 MG tablet Commonly known as: PEPCID Take 20 mg by mouth daily.   furosemide  20 MG tablet Commonly known as: LASIX Take 1 tablet (20 mg total) by mouth daily.   hydroxychloroquine 200 MG  tablet Commonly known as: PLAQUENIL Take 200 mg by mouth daily.   lactose free nutrition Liqd Take by mouth 2 (two) times daily. 1/2 box; Resident likes chocolate. Twice A Day   levothyroxine 50 MCG tablet Commonly known as: SYNTHROID Take 50 mcg by mouth daily before breakfast.   lubiprostone 24 MCG capsule Commonly known as: AMITIZA Take 24 mcg by mouth 2 (two) times daily with a meal.   multivitamin with minerals Tabs tablet Take 1 tablet by mouth daily.   nystatin powder Commonly known as: MYCOSTATIN/NYSTOP Apply topically 2 (two) times daily as needed.   pantoprazole 40 MG tablet Commonly known as: PROTONIX Take 40 mg by mouth daily.   polyethylene glycol 17 g packet Commonly known as: MIRALAX / GLYCOLAX Take 17 g by mouth daily.   potassium citrate 10 MEQ (1080 MG) SR tablet Commonly known as: UROCIT-K Take 10 mEq by mouth 2 (two) times daily.   pramipexole 0.125 MG tablet Commonly known as: MIRAPEX Take 1 tablet (0.125 mg total) by mouth at bedtime.   PRESERVISION AREDS 2 PO Take 1 tablet by mouth daily.   QUEtiapine 25 MG tablet Commonly known as: SEROQUEL Take 25 mg by mouth See admin instructions.   QUEtiapine 25 MG tablet Commonly known as: SEROQUEL Take 12.5 mg by mouth every morning.   rosuvastatin 20 MG tablet Commonly known as: CRESTOR Take 10 mg by mouth daily.   saccharomyces boulardii 250 MG capsule Commonly known as: FLORASTOR Take 250 mg by mouth daily.   senna-docusate 8.6-50 MG tablet Commonly known as: Senokot-S Take 1 tablet by mouth at bedtime.   sertraline 50 MG tablet Commonly known as: ZOLOFT Take 100 mg by mouth at bedtime.   Slow Fe 142 (45 Fe) MG Tbcr Generic drug: Ferrous Sulfate Take 142 mg by mouth in the morning.   Systane 0.4-0.3 % Soln Generic drug: Polyethyl Glycol-Propyl Glycol Apply 1 drop to eye 2 (two) times daily as needed (dry eyes).   traMADol 50 MG tablet Commonly known as: ULTRAM Take 0.5  tablets (25 mg total) by mouth 3 (three) times daily as needed.   Vitamin D 50 MCG (2000 UT) tablet Take 2,000 Units by mouth daily.        Review of Systems  Constitutional:  Negative for activity change, appetite change and fever.  HENT:  Positive for hearing loss. Negative for congestion and voice change.   Eyes:  Negative for visual disturbance.  Respiratory:  Positive for shortness of breath. Negative for cough.        Chronic DOE  Cardiovascular:  Positive for leg swelling.  Gastrointestinal:  Positive for nausea. Negative for abdominal pain, constipation and vomiting.  Genitourinary:  Positive for frequency. Negative for dysuria and urgency.  Musculoskeletal:  Positive for arthralgias, back pain, gait problem and myalgias.       Chronic lower back pain. The R 3rd finger mid knuckle pain is chronic.   Skin:  Negative for color change.  Neurological:  Positive for tremors. Negative for speech difficulty, weakness and light-headedness.       Memory lapses. Minimal resting tremor in fingers, not disabling. RLS  Psychiatric/Behavioral:  Positive for hallucinations. Negative for behavioral problems and sleep disturbance. The patient is not nervous/anxious.    Immunization History  Administered Date(s) Administered   Hepatitis A, Adult 03/01/1997, 09/20/1997   Influenza Split 09/09/2013,  09/27/2014   Influenza, High Dose Seasonal PF 08/01/2011, 08/27/2012, 10/14/2013, 09/08/2015, 09/10/2016, 09/12/2017, 09/12/2018, 09/15/2019   Influenza,inj,Quad PF,6+ Mos 09/11/2016   Influenza-Unspecified 09/21/2020, 09/28/2021   Moderna Sars-Covid-2 Vaccination 12/12/2019, 01/09/2020, 10/18/2020, 05/09/2021, 08/29/2021   Pneumococcal Conjugate-13 04/15/2014   Pneumococcal Polysaccharide-23 09/28/2005   Td 06/20/2016   Tdap 04/29/2006, 09/01/2015   Zoster Recombinat (Shingrix) 06/11/2018, 09/10/2018   Zoster, Live 04/29/2006   Pertinent  Health Maintenance Due  Topic Date Due   INFLUENZA  VACCINE  Completed   DEXA SCAN  Completed   Fall Risk 08/03/2021 08/04/2021 08/09/2021 09/27/2021 10/10/2021  Falls in the past year? 1 1 1  - 1  Was there an injury with Fall? 1 1 1  - 0  Was there an injury with Fall? - - - - -  Fall Risk Category Calculator 2 3 2  - 2  Fall Risk Category Moderate High Moderate - Moderate  Patient Fall Risk Level - High fall risk High fall risk High fall risk Moderate fall risk  Patient at Risk for Falls Due to - - History of fall(s) - -  Patient at Risk for Falls Due to - - - - -  Fall risk Follow up - - Falls evaluation completed;Education provided - -   Functional Status Survey:    Vitals:   01/18/22 1117  BP: 98/68  Pulse: 87  Resp: 17  Temp: 98.4 F (36.9 C)  SpO2: 98%   There is no height or weight on file to calculate BMI. Physical Exam Vitals and nursing note reviewed.  Constitutional:      Appearance: Normal appearance.  HENT:     Head: Normocephalic and atraumatic.     Mouth/Throat:     Mouth: Mucous membranes are moist.  Eyes:     Extraocular Movements: Extraocular movements intact.     Conjunctiva/sclera: Conjunctivae normal.     Pupils: Pupils are equal, round, and reactive to light.  Cardiovascular:     Rate and Rhythm: Normal rate and regular rhythm.     Heart sounds: Murmur heard.  Pulmonary:     Effort: Pulmonary effort is normal.     Breath sounds: No rales.  Abdominal:     General: Bowel sounds are normal. There is no distension.     Palpations: Abdomen is soft.     Tenderness: There is no abdominal tenderness. There is no right CVA tenderness, left CVA tenderness, guarding or rebound.     Hernia: No hernia is present.     Comments: Suprapubic area discomfort when palpated.   Genitourinary:    Comments: Reported protruded rectum was reduced on its own, a external hemorrhoid at 7pm, no s/s of injury.  Musculoskeletal:        General: Tenderness present.     Cervical back: Normal range of motion and neck supple.      Right lower leg: Edema present.     Left lower leg: Edema present.     Comments: Trace edema BLE.  Arthritic changes in fingers, R>L. The R 3rd finger mid knuckle swelling, no heat or redness. Chronic lower back pain. Left antecubital area pain with movement and palpation. No redness, swelling, warmth, or mass noted.   Skin:    General: Skin is warm and dry.  Neurological:     General: No focal deficit present.     Mental Status: She is alert. Mental status is at baseline.     Gait: Gait abnormal.     Comments: Oriented to person, place.  Ambulates with walker.   Psychiatric:        Mood and Affect: Mood normal.        Behavior: Behavior normal.     Comments: Visual hallucination vs delusion    Labs reviewed: Recent Labs    04/12/21 0005 09/27/21 0720  NA 140 140  K 4.2 4.3  CL 104 108  CO2 25* 24  GLUCOSE  --  90  BUN 18 14  CREATININE 0.9 0.85  CALCIUM 9.8 9.4   Recent Labs    04/12/21 0005  AST 19  ALT 8  ALKPHOS 78  ALBUMIN 4.1   Recent Labs    04/12/21 0005 09/27/21 0720 10/05/21 0000 10/09/21 0000 11/23/21 0000  WBC 6.7 5.0 5.1 5.4 6.9  NEUTROABS 4,147.00  --  2,739.00 2,538.00  --   HGB 11.0* 8.9* 8.0* 8.0* 10.1*  HCT 34* 29.4* 26* 26* 31*  MCV  --  98.3  --   --   --   PLT 282 307 253 281 268   Lab Results  Component Value Date   TSH 1.62 04/12/2021   No results found for: HGBA1C Lab Results  Component Value Date   CHOL 105 10/05/2020   HDL 46 10/05/2020   LDLCALC 38 10/05/2020   TRIG 119 10/05/2020   CHOLHDL 2.4 07/14/2019    Significant Diagnostic Results in last 30 days:  No results found.  Assessment/Plan: Nausea c/o nausea since breakfast, had BM, denied abd pain, dysuria, cough, or fever/chills. Will try Phenergan 12.5mg  q6h prn x 24, observe   Rectal prolapse avoid constipation, usually reduced on its own.   Renal lithiasis Hx of rena lithiasis, recurrent UTI, urosepsis, treated by ID. 09/19/21 cystoscopy, laser lithotripsy, R  ureteral stent/dilatation. Resolved dysuria, decreased urinary frequency.  GERD (gastroesophageal reflux disease) takes Pantoprazole 40mg  qd, Famotidine 20mg  qd. yearly FOBT per GI, Hgb 9.9 01/11/22  Parkinson's disease ([place]) takes Sinemet, MiraPex f/u Dr. Carles Collet  Restless leg syndrome stable on MiraPex, Sinemet hs, better sleep at night.   Depression, psychotic ([place]) stabilizing, takes Sertraline,  Quetiapine. Change Tramadol to prn helped.   Chronic constipation Stable,  takes Senokot S, Amitiza, Colace, MiraLax   Hypothyroidism  takes Levothyroxine 27mcg qd. TSH 1.67 01/11/22  Erosive (osteo)arthritis Chronic,  takes Plaquenil, Tylenol  f/u Rheumatology.   Chronic diastolic CHF (congestive heart failure) (HCC) trace edema BLE, takes Furosemide, cardiology, severe aortic valve stenosis. Echocardiogram 11/15/20 EF 60-65%. Bun/creat 14/0.85 09/27/21  Iron deficiency anemia  takes Fe, Hgb 9.9 01/11/22  Aortic stenosis palliative approach. More noticeable DOE, fatigue.   Hypertension Runs low, only takes Furosemide 20mg  qd, will continue to monitor Bp    Family/ staff Communication: plan of care reviewed with the patient and charge nurse.   Labs/tests ordered:  none  Time spend 35 minutes.

## 2022-01-19 ENCOUNTER — Encounter: Payer: Self-pay | Admitting: Nurse Practitioner

## 2022-01-19 DIAGNOSIS — F411 Generalized anxiety disorder: Secondary | ICD-10-CM | POA: Diagnosis not present

## 2022-01-19 DIAGNOSIS — F323 Major depressive disorder, single episode, severe with psychotic features: Secondary | ICD-10-CM | POA: Diagnosis not present

## 2022-01-19 DIAGNOSIS — R441 Visual hallucinations: Secondary | ICD-10-CM | POA: Diagnosis not present

## 2022-01-19 NOTE — Assessment & Plan Note (Signed)
Runs low, only takes Furosemide 20mg  qd, will continue to monitor Bp

## 2022-01-26 ENCOUNTER — Non-Acute Institutional Stay (SKILLED_NURSING_FACILITY): Payer: Medicare Other | Admitting: Nurse Practitioner

## 2022-01-26 ENCOUNTER — Encounter: Payer: Self-pay | Admitting: Nurse Practitioner

## 2022-01-26 DIAGNOSIS — G2581 Restless legs syndrome: Secondary | ICD-10-CM | POA: Diagnosis not present

## 2022-01-26 DIAGNOSIS — K623 Rectal prolapse: Secondary | ICD-10-CM | POA: Diagnosis not present

## 2022-01-26 DIAGNOSIS — E039 Hypothyroidism, unspecified: Secondary | ICD-10-CM | POA: Diagnosis not present

## 2022-01-26 DIAGNOSIS — F323 Major depressive disorder, single episode, severe with psychotic features: Secondary | ICD-10-CM | POA: Diagnosis not present

## 2022-01-26 DIAGNOSIS — N200 Calculus of kidney: Secondary | ICD-10-CM

## 2022-01-26 DIAGNOSIS — I5032 Chronic diastolic (congestive) heart failure: Secondary | ICD-10-CM

## 2022-01-26 DIAGNOSIS — H5712 Ocular pain, left eye: Secondary | ICD-10-CM | POA: Diagnosis not present

## 2022-01-26 DIAGNOSIS — D508 Other iron deficiency anemias: Secondary | ICD-10-CM | POA: Diagnosis not present

## 2022-01-26 DIAGNOSIS — I350 Nonrheumatic aortic (valve) stenosis: Secondary | ICD-10-CM

## 2022-01-26 DIAGNOSIS — K219 Gastro-esophageal reflux disease without esophagitis: Secondary | ICD-10-CM | POA: Diagnosis not present

## 2022-01-26 DIAGNOSIS — G20 Parkinson's disease: Secondary | ICD-10-CM | POA: Diagnosis not present

## 2022-01-26 DIAGNOSIS — K5909 Other constipation: Secondary | ICD-10-CM | POA: Diagnosis not present

## 2022-01-26 DIAGNOSIS — M154 Erosive (osteo)arthritis: Secondary | ICD-10-CM

## 2022-01-26 NOTE — Progress Notes (Signed)
Location:  [facility] Room Number: N045/A Place of Service:  SNF (31) Provider:  Bre Pecina Otho Darner, NP  Patient Care Team: [name], MD as PCP - General (Internal Medicine) [name], MD as PCP - Cardiology (Cardiology) [name], MD (Neurosurgery) [name], MD (Neurology) [name], MD (Inactive) as Consulting Physician (Gastroenterology) [name], MD as Consulting Physician (Urology) Karagan Lehr X, NP as Nurse Practitioner (Internal Medicine) Tat, Eustace Quail, DO as Consulting Physician (Neurology)  Extended Emergency Contact Information Primary Emergency Contact: [facility] ([place] Address: [address]          [place], [place] [address] [name] of [place] Phone: [phone] Mobile Phone: [phone] Relation: Spouse Secondary Emergency Contact: [name] Mobile Phone: [phone] Relation: Daughter Preferred language: [name] Interpreter needed? No  Code Status:  DNR Goals of care: Advanced Directive information Advanced Directives 01/26/2022  Does Patient Have a Medical Advance Directive? Yes  Type of Advance Directive Living will;Healthcare Power of [place];Out of facility DNR (pink MOST or yellow form)  Does patient want to make changes to medical advance directive? No - Patient declined  Copy of [place] in Chart? Yes - validated most recent copy scanned in chart (See row information)  Would patient like information on creating a medical advance directive? -  Pre-existing out of facility DNR order (yellow form or pink MOST form) Yellow form placed in chart (order not valid for inpatient use)     Chief Complaint  Patient presents with   Medical Management of Chronic Issues    Routine visit   Quality Metric Gaps    Discuss the need for additional COVID booster, or post pone if patient refuses.     HPI:  Pt is a 88 y.o. female seen today for medical management of  chronic diseases.    C/o discomfort left eye, no redness, drainage, or change of vision, denied eye pain.  Prolapsed rectum, avoid constipation, usually reduced on its own.              Hx of rena lithiasis, recurrent UTI, urosepsis, treated by ID. 09/19/21 cystoscopy, laser lithotripsy, R ureteral stent/dilatation. Resolved dysuria, decreased urinary frequency.              GERD, takes Pantoprazole 40mg  qd, Famotidine 20mg  qd. yearly FOBT per GI, Hgb 9.9 01/11/22             Parkinson's, takes Sinemet, MiraPex f/u Dr. Carles Collet             RLS stable on MiraPex, Sinemet hs, better sleep at night.              Depression/hallucination, stabilizing, takes Sertraline,  Quetiapine. Change Tramadol to prn helped.              Constipation, takes Senokot S, Amitiza, Colace, MiraLax              Hypothyroidism, takes Levothyroxine 43mcg qd. TSH 1.67 01/11/22             Erosive arthritis, takes Plaquenil, Tylenol  f/u Rheumatology.              CHF/trace edema BLE, takes Furosemide, cardiology, severe aortic valve stenosis. Echocardiogram 11/15/20 EF 60-65%. Bun/creat 14/0.85 09/27/21             Anemia, takes Fe, Hgb 9.9 01/11/22             AS Cardiology palliative approach. More noticeable  DOE, fatigue.   Past Medical History:  Diagnosis Date   Abnormal nuclear stress test    Actinic cheilitis 04/23/2019   Anemia    iron deficiency    Anxiety    Aortic stenosis    severe by echo 2021    Arthritis    Broken arm    right    Chronic diastolic CHF (congestive heart failure) ([place])    Chronic pain    Cognitive communication deficit    Cognitive deficits    Communication   Constipation    Depression    Erosive (osteo)arthritis 03/09/2020   Family history of adverse reaction to anesthesia    pt. states sister vomits   Fibromyalgia    GERD (gastroesophageal reflux disease)    H/O hiatal hernia    Hallucinations, visual 07/21/2018   03/10/20 psych consult.    Hematuria 05/18/2020   History of  bronchitis    History of kidney stones    Hypercholesterolemia    Hypertension    dr t turner   Hypothyroidism    Memory loss 04/21/2018   08/08/18 CT head no traumatic findings, atrophy.  09/27/19 MMSE 25/30, failed the clock drawing   Microhematuria 03/24/2015   Neuropathy    Osteoporosis    Parkinson disease (HCC)    PVD (peripheral vascular disease) (HCC)    99% stenosis of left anteiror tibial artery, mod stenosis of left distal SFA and popliteal artery followed by [name]   RBBB    noted on EKG 2018   Restless leg syndrome    Sepsis ([place])    hx of due to Ecoli   Septic shock ([place]) 04/19/2020   Shortness of breath    with exertion - chronic    Sjogren's disease ([place])    Sjogren's syndrome ([place]) 02/04/2018   04/16/19 rheumatology: Erosive OA of hands, Sjogrens syndrome, low back pain at multiple sites, recurrent kidney stones, age related osteoporosis w/o current pathological fracture, f/u 6 months.    SOB (shortness of breath)    chronic due to diastolic dysfunction, deconditioning, obesity   Spinal stenosis    lumbar region    Tremor    Unstable gait 04/21/2018   Unsteadiness on feet    Urinary frequency 09/18/2018   Urinary tract infection    Visual hallucinations    Past Surgical History:  Procedure Laterality Date   ABDOMINAL HYSTERECTOMY     BACK SURGERY     4 back surgeries,   lumbar fusion   CARDIAC CATHETERIZATION  2006   normal   CYSTOSCOPY/URETEROSCOPY/HOLMIUM LASER/STENT PLACEMENT Left 01/06/2019   Procedure: CYSTOSCOPY/RETROGRADE/URETEROSCOPY/HOLMIUM LASER/BASKET RETRIEVAL/STENT PLACEMENT;  Surgeon: Ceasar Mons, MD;  Location: [facility];  Service: Urology;  Laterality: Left;   CYSTOSCOPY/URETEROSCOPY/HOLMIUM LASER/STENT PLACEMENT Bilateral 05/24/2020   Procedure: CYSTOSCOPY/RETROGRADE/URETEROSCOPY/HOLMIUM LASER/STENT PLACEMENT;  Surgeon: Ceasar Mons, MD;  Location: [facility];  Service: Urology;  Laterality: Bilateral;    CYSTOSCOPY/URETEROSCOPY/HOLMIUM LASER/STENT PLACEMENT Right 09/27/2021   Procedure: CYSTOSCOPY/BILATERAL RETROGRADE/URETEROSCOPY/HOLMIUM LASER/STENT PLACEMENT;  Surgeon: Ceasar Mons, MD;  Location: [facility];  Service: Urology;  Laterality: Right;  ONLY NEEDS 60 MIN   EYE SURGERY Bilateral    cateracts   falls     variious fall, broken wrist,and toes   FRACTURE SURGERY Right April 2016   Wrist, Pt. fell   IR NEPHROSTOMY PLACEMENT RIGHT  04/19/2020   kidney stone removal Left 01/20/2019   RIGHT/LEFT HEART CATH AND CORONARY ANGIOGRAPHY N/A 02/05/2018   Procedure: RIGHT/LEFT HEART CATH AND CORONARY ANGIOGRAPHY;  Surgeon:  Troy Sine, MD;  Location: [facility];  Service: Cardiovascular;  Laterality: N/A;   SPINAL CORD STIMULATOR INSERTION N/A 09/09/2015   Procedure: LUMBAR SPINAL CORD STIMULATOR INSERTION;  Surgeon: Clydell Hakim, MD;  Location: [facility];  Service: Neurosurgery;  Laterality: N/A;  LUMBAR SPINAL CORD STIMULATOR INSERTION   SPINE SURGERY  April 2013   Back X's 4   TOTAL HIP ARTHROPLASTY     right   WRIST FRACTURE SURGERY Bilateral     Allergies  Allergen Reactions   Adrenalone    Lactose Other (See Comments)    abd pain, lactose intolerant   Morphine Other (See Comments)   Sulfa Antibiotics Other (See Comments)    Headache, very sick   Codeine Other (See Comments)    Reaction:  Headaches and nightmares    Lactose Intolerance (Gi) Nausea And Vomiting   Latex Rash   Lyrica [Pregabalin] Swelling and Other (See Comments)    Reaction:  Leg swelling   Other Other (See Comments)    Pt states that pain medications give her nightmares.     Plaquenil [Hydroxychloroquine Sulfate] Other (See Comments)    Reaction:  GI upset    Reglan [Metoclopramide] Other (See Comments)    Reaction:  GI upset    Requip [Ropinirole Hcl] Other (See Comments)    Reaction:  GI upset    Septra [Sulfamethoxazole-Trimethoprim] Nausea And Vomiting   Shellfish Allergy Nausea  And Vomiting    Outpatient Encounter Medications as of 01/26/2022  Medication Sig   acetaminophen (TYLENOL) 500 MG tablet Take 500 mg by mouth See admin instructions. Take 1 tablet (500 mg) by mouth scheduled 3 times daily scheduled & 3 times daily as needed for pain (Not to exceed 3g/24 hrs)   aspirin 81 MG chewable tablet Chew 81 mg by mouth daily.   Calcium Carb-Cholecalciferol (CALCIUM 600/VITAMIN D3) 600-800 MG-UNIT TABS Take 1 tablet by mouth daily.   carbidopa-levodopa (SINEMET IR) 25-100 MG tablet Take 1 tablet by mouth 3 (three) times daily.   Carbidopa-Levodopa ER (SINEMET CR) 25-100 MG tablet controlled release Take 1 tablet by mouth at bedtime.   Cholecalciferol (VITAMIN D) 50 MCG (2000 UT) tablet Take 2,000 Units by mouth daily.   docusate sodium (COLACE) 100 MG capsule Take 100 mg by mouth at bedtime.   famotidine (PEPCID) 20 MG tablet Take 20 mg by mouth daily.   Ferrous Sulfate (SLOW FE) 142 (45 Fe) MG TBCR Take 142 mg by mouth in the morning.   furosemide (LASIX) 20 MG tablet Take 1 tablet (20 mg total) by mouth daily.   hydroxychloroquine (PLAQUENIL) 200 MG tablet Take 200 mg by mouth daily.   lactose free nutrition (BOOST) LIQD Take by mouth 2 (two) times daily. 1/2 box; Resident likes chocolate. Twice A Day   levothyroxine (SYNTHROID, LEVOTHROID) 50 MCG tablet Take 50 mcg by mouth daily before breakfast.    lubiprostone (AMITIZA) 24 MCG capsule Take 24 mcg by mouth 2 (two) times daily with a meal.    Multiple Vitamin (MULTIVITAMIN WITH MINERALS) TABS tablet Take 1 tablet by mouth daily.   Multiple Vitamins-Minerals (PRESERVISION AREDS 2 PO) Take 1 tablet by mouth daily.   nystatin (MYCOSTATIN/NYSTOP) powder Apply topically 2 (two) times daily as needed.   pantoprazole (PROTONIX) 40 MG tablet Take 40 mg by mouth daily.   Polyethyl Glycol-Propyl Glycol (SYSTANE) 0.4-0.3 % SOLN Apply 1 drop to eye 2 (two) times daily as needed (dry eyes).    polyethylene glycol (MIRALAX /  GLYCOLAX) 17 g packet Take 17 g by mouth daily.   potassium citrate (UROCIT-K) 10 MEQ (1080 MG) SR tablet Take 10 mEq by mouth 2 (two) times daily.   QUEtiapine (SEROQUEL) 25 MG tablet Take 25 mg by mouth See admin instructions.   QUEtiapine (SEROQUEL) 25 MG tablet Take 12.5 mg by mouth every morning.   rosuvastatin (CRESTOR) 20 MG tablet Take 10 mg by mouth daily.   saccharomyces boulardii (FLORASTOR) 250 MG capsule Take 250 mg by mouth daily.   senna-docusate (SENOKOT-S) 8.6-50 MG tablet Take 1 tablet by mouth at bedtime.    sertraline (ZOLOFT) 50 MG tablet Take 100 mg by mouth at bedtime.   Skin Protectants, Misc. (EUCERIN) cream Apply 1 application topically daily.   Spacer/Aero-Holding Chambers (AEROCHAMBER MV) inhaler by Other route. Use as instructed as needed   [DISCONTINUED] pramipexole (MIRAPEX) 0.125 MG tablet Take 1 tablet (0.125 mg total) by mouth at bedtime. (Patient not taking: Reported on 01/26/2022)   [DISCONTINUED] traMADol (ULTRAM) 50 MG tablet Take 0.5 tablets (25 mg total) by mouth 3 (three) times daily as needed. (Patient not taking: Reported on 01/26/2022)   No facility-administered encounter medications on file as of 01/26/2022.    Review of Systems  Constitutional:  Negative for activity change, appetite change and fever.  HENT:  Positive for hearing loss. Negative for congestion and voice change.   Eyes:  Negative for photophobia, pain, discharge, redness, itching and visual disturbance.       Feels discomfort in left eye.   Respiratory:  Positive for shortness of breath. Negative for cough.        Chronic DOE  Cardiovascular:  Positive for leg swelling.  Gastrointestinal:  Negative for abdominal pain and constipation.  Genitourinary:  Positive for frequency. Negative for dysuria and urgency.  Musculoskeletal:  Positive for arthralgias, back pain, gait problem and myalgias.       Chronic lower back pain. The R 3rd finger mid knuckle pain is chronic.   Skin:   Negative for color change.  Neurological:  Positive for tremors. Negative for speech difficulty, weakness and headaches.       Memory lapses. Minimal resting tremor in fingers, not disabling. RLS  Psychiatric/Behavioral:  Positive for hallucinations. Negative for sleep disturbance. The patient is not nervous/anxious.    Immunization History  Administered Date(s) Administered   Hepatitis A, Adult 03/01/1997, 09/20/1997   Influenza Split 09/09/2013, 09/27/2014   Influenza, High Dose Seasonal PF 08/01/2011, 08/27/2012, 10/14/2013, 09/08/2015, 09/10/2016, 09/12/2017, 09/12/2018, 09/15/2019   Influenza,inj,Quad PF,6+ Mos 09/11/2016   Influenza-Unspecified 09/21/2020, 09/28/2021   Moderna Sars-Covid-2 Vaccination 12/12/2019, 01/09/2020, 10/18/2020, 05/09/2021, 08/29/2021   Pneumococcal Conjugate-13 04/15/2014   Pneumococcal Polysaccharide-23 09/28/2005   Td 06/20/2016   Tdap 04/29/2006, 09/01/2015   Zoster Recombinat (Shingrix) 06/11/2018, 09/10/2018   Zoster, Live 04/29/2006   Pertinent  Health Maintenance Due  Topic Date Due   INFLUENZA VACCINE  Completed   DEXA SCAN  Completed   Fall Risk 08/03/2021 08/04/2021 08/09/2021 09/27/2021 10/10/2021  Falls in the past year? 1 1 1  - 1  Was there an injury with Fall? 1 1 1  - 0  Was there an injury with Fall? - - - - -  Fall Risk Category Calculator 2 3 2  - 2  Fall Risk Category Moderate High Moderate - Moderate  Patient Fall Risk Level - High fall risk High fall risk High fall risk Moderate fall risk  Patient at Risk for Falls Due to - - History of fall(s) - -  Patient at Risk for Falls Due to - - - - -  Fall risk Follow up - - Falls evaluation completed;Education provided - -   Functional Status Survey:    Vitals:   01/26/22 1508  BP: 102/60  Pulse: 78  Resp: 17  Temp: (!) 97.3 F (36.3 C)  SpO2: 96%  Weight: 133 lb 3.2 oz (60.4 kg)  Height: 5\' 2"  (1.575 m)   Body mass index is 24.36 kg/m. Physical Exam Vitals and nursing note  reviewed.  Constitutional:      Appearance: Normal appearance.  HENT:     Head: Normocephalic and atraumatic.     Nose: Nose normal.     Mouth/Throat:     Mouth: Mucous membranes are moist.  Eyes:     General:        Right eye: No discharge.        Left eye: No discharge.     Extraocular Movements: Extraocular movements intact.     Conjunctiva/sclera: Conjunctivae normal.     Pupils: Pupils are equal, round, and reactive to light.  Cardiovascular:     Rate and Rhythm: Normal rate and regular rhythm.     Heart sounds: Murmur heard.  Pulmonary:     Effort: Pulmonary effort is normal.     Breath sounds: No rales.  Abdominal:     General: Bowel sounds are normal.     Palpations: Abdomen is soft.     Tenderness: There is no abdominal tenderness.  Genitourinary:    Comments: Hx of reported protruded rectum was reduced on its own, a external hemorrhoid at 7pm, no s/s of injury.  Musculoskeletal:        General: Tenderness present.     Cervical back: Normal range of motion and neck supple.     Right lower leg: Edema present.     Left lower leg: Edema present.     Comments: Trace edema BLE.  Arthritic changes in fingers, R>L. The R 3rd finger mid knuckle swelling, no heat or redness. Chronic lower back pain.   Skin:    General: Skin is warm and dry.  Neurological:     General: No focal deficit present.     Mental Status: She is alert. Mental status is at baseline.     Gait: Gait abnormal.     Comments: Oriented to person, place. Ambulates with walker.   Psychiatric:        Mood and Affect: Mood normal.        Behavior: Behavior normal.     Comments: Visual hallucination vs delusion    Labs reviewed: Recent Labs    04/12/21 0005 09/27/21 0720 01/11/22 0000  NA 140 140 141  K 4.2 4.3 4.4  CL 104 108 106  CO2 25* 24 24*  GLUCOSE  --  90  --   BUN 18 14 20   CREATININE 0.9 0.85 1.0  CALCIUM 9.8 9.4 9.5   Recent Labs    04/12/21 0005 01/11/22 0000  AST 19  --   ALT  8  --   ALKPHOS 78  --   ALBUMIN 4.1 3.8   Recent Labs    04/12/21 0005 09/27/21 0720 10/05/21 0000 10/09/21 0000 11/23/21 0000  WBC 6.7 5.0 5.1 5.4 6.9  NEUTROABS 4,147.00  --  2,739.00 2,538.00  --   HGB 11.0* 8.9* 8.0* 8.0* 10.1*  HCT 34* 29.4* 26* 26* 31*  MCV  --  98.3  --   --   --  PLT 282 307 253 281 268   Lab Results  Component Value Date   TSH 1.67 01/11/2022   No results found for: HGBA1C Lab Results  Component Value Date   CHOL 137 01/11/2022   HDL 47 01/11/2022   LDLCALC 64 01/11/2022   TRIG 184 (A) 01/11/2022   CHOLHDL 2.4 07/14/2019    Significant Diagnostic Results in last 30 days:  No results found.  Assessment/Plan Discomfort of left eye Artificial tears 2gtt OU tid x1 wk, hx of Sjogren symptoms, the patient stated she has upcoming ophthalmology appointment.    Rectal prolapse avoid constipation, usually reduced on its own.   Renal lithiasis recurrent UTI, urosepsis, treated by ID. 09/19/21 cystoscopy, laser lithotripsy, R ureteral stent/dilatation. Resolved dysuria, decreased urinary frequency.   GERD (gastroesophageal reflux disease) takes Pantoprazole 40mg  qd, Famotidine 20mg  qd. yearly FOBT per GI, Hgb 9.9 01/11/22  Parkinson's disease ([place])  Ambulates with walker with SBA, w/c for mobility, takes Sinemet, MiraPex f/u Dr. Carles Collet  Restless leg syndrome stable on MiraPex, Sinemet hs, better sleep at night.   Depression, psychotic ([place])  stabilizing, takes Sertraline,  Quetiapine. Change Tramadol to prn helped.   Chronic constipation takes Senokot S, Amitiza, Colace, MiraLax   Hypothyroidism takes Levothyroxine 47mcg qd. TSH 1.67 01/11/22  Erosive (osteo)arthritis takes Plaquenil, Tylenol  f/u Rheumatology.   Chronic diastolic CHF (congestive heart failure) (HCC) /trace edema BLE, takes Furosemide, cardiology, severe aortic valve stenosis. Echocardiogram 11/15/20 EF 60-65%. Bun/creat 14/0.85 09/27/21  Iron deficiency anemia  takes Fe,  Hgb 9.9 01/11/22  Aortic stenosis Cardiology palliative approach. More noticeable DOE, fatigue.   Family/ staff Communication: plan of care reviewed with the patient and charge nurse.   Labs/tests ordered: none  Time spend 35 minutes.

## 2022-01-26 NOTE — Assessment & Plan Note (Addendum)
Artificial tears 2gtt OU tid x1 wk, hx of Sjogren symptoms, the patient stated she has upcoming ophthalmology appointment.

## 2022-01-29 ENCOUNTER — Encounter: Payer: Self-pay | Admitting: Nurse Practitioner

## 2022-01-29 NOTE — Assessment & Plan Note (Signed)
takes Pantoprazole 40mg  qd, Famotidine 20mg  qd. yearly FOBT per GI, Hgb 9.9 01/11/22

## 2022-01-29 NOTE — Assessment & Plan Note (Signed)
/  trace edema BLE, takes Furosemide, cardiology, severe aortic valve stenosis. Echocardiogram 11/15/20 EF 60-65%. Bun/creat 14/0.85 09/27/21

## 2022-01-29 NOTE — Assessment & Plan Note (Signed)
takes Fe, Hgb 9.9 01/11/22

## 2022-01-29 NOTE — Assessment & Plan Note (Signed)
recurrent UTI, urosepsis, treated by ID. 09/19/21 cystoscopy, laser lithotripsy, R ureteral stent/dilatation. Resolved dysuria, decreased urinary frequency.

## 2022-01-29 NOTE — Assessment & Plan Note (Signed)
Ambulates with walker with SBA, w/c for mobility, takes Sinemet, MiraPex f/u Dr. Carles Collet

## 2022-01-29 NOTE — Assessment & Plan Note (Signed)
takes Senokot S, Amitiza, Colace, MiraLax

## 2022-01-29 NOTE — Assessment & Plan Note (Signed)
stabilizing, takes Sertraline,  Quetiapine. Change Tramadol to prn helped.

## 2022-01-29 NOTE — Assessment & Plan Note (Signed)
stable on MiraPex, Sinemet hs, better sleep at night.  

## 2022-01-29 NOTE — Assessment & Plan Note (Signed)
takes Plaquenil, Tylenol  f/u Rheumatology.

## 2022-01-29 NOTE — Assessment & Plan Note (Signed)
avoid constipation, usually reduced on its own.  

## 2022-01-29 NOTE — Assessment & Plan Note (Signed)
Cardiology palliative approach. More noticeable DOE, fatigue.

## 2022-01-29 NOTE — Assessment & Plan Note (Signed)
takes Levothyroxine 33mcg qd. TSH 1.67 01/11/22

## 2022-01-30 ENCOUNTER — Encounter: Payer: Self-pay | Admitting: Nurse Practitioner

## 2022-02-05 ENCOUNTER — Non-Acute Institutional Stay (SKILLED_NURSING_FACILITY): Payer: Medicare Other | Admitting: Nurse Practitioner

## 2022-02-05 ENCOUNTER — Encounter: Payer: Self-pay | Admitting: Nurse Practitioner

## 2022-02-05 DIAGNOSIS — I350 Nonrheumatic aortic (valve) stenosis: Secondary | ICD-10-CM

## 2022-02-05 DIAGNOSIS — G2581 Restless legs syndrome: Secondary | ICD-10-CM

## 2022-02-05 DIAGNOSIS — I5032 Chronic diastolic (congestive) heart failure: Secondary | ICD-10-CM

## 2022-02-05 DIAGNOSIS — D508 Other iron deficiency anemias: Secondary | ICD-10-CM

## 2022-02-05 DIAGNOSIS — M3500 Sicca syndrome, unspecified: Secondary | ICD-10-CM

## 2022-02-05 DIAGNOSIS — G20 Parkinson's disease: Secondary | ICD-10-CM

## 2022-02-05 DIAGNOSIS — K623 Rectal prolapse: Secondary | ICD-10-CM | POA: Diagnosis not present

## 2022-02-05 DIAGNOSIS — N200 Calculus of kidney: Secondary | ICD-10-CM | POA: Diagnosis not present

## 2022-02-05 DIAGNOSIS — E039 Hypothyroidism, unspecified: Secondary | ICD-10-CM | POA: Diagnosis not present

## 2022-02-05 DIAGNOSIS — F323 Major depressive disorder, single episode, severe with psychotic features: Secondary | ICD-10-CM | POA: Diagnosis not present

## 2022-02-05 DIAGNOSIS — K5909 Other constipation: Secondary | ICD-10-CM | POA: Diagnosis not present

## 2022-02-05 DIAGNOSIS — K219 Gastro-esophageal reflux disease without esophagitis: Secondary | ICD-10-CM | POA: Diagnosis not present

## 2022-02-05 DIAGNOSIS — M154 Erosive (osteo)arthritis: Secondary | ICD-10-CM | POA: Diagnosis not present

## 2022-02-05 DIAGNOSIS — L740 Miliaria rubra: Secondary | ICD-10-CM | POA: Diagnosis not present

## 2022-02-05 NOTE — Progress Notes (Signed)
Location:   SNF [place] Room Number: 45 Place of Service:  SNF (31) Provider: [facility] Bethan Adamek NP  [name], MD  Patient Care Team: [name], MD as PCP - General (Internal Medicine) [name], MD as PCP - Cardiology (Cardiology) [name], MD (Neurosurgery) [name], MD (Neurology) [name], MD (Inactive) as Consulting Physician (Gastroenterology) [name], MD as Consulting Physician (Urology) Montavis Schubring X, NP as Nurse Practitioner (Internal Medicine) Tat, Eustace Quail, DO as Consulting Physician (Neurology)  Extended Emergency Contact Information Primary Emergency Contact: [facility] Address: [address]          [place], [place] [address] [name] of [place] Phone: [phone] Mobile Phone: [phone] Relation: Spouse Secondary Emergency Contact: [name] Mobile Phone: [phone] Relation: Daughter Preferred language: [name] Interpreter needed? No  Code Status: DNR Goals of care: Advanced Directive information Advanced Directives 02/05/2022  Does Patient Have a Medical Advance Directive? Yes  Type of Advance Directive Living will;Healthcare Power of [place];Out of facility DNR (pink MOST or yellow form)  Does patient want to make changes to medical advance directive? -  Copy of [place] in Chart? Yes - validated most recent copy scanned in chart (See row information)  Would patient like information on creating a medical advance directive? -  Pre-existing out of facility DNR order (yellow form or pink MOST form) Yellow form placed in chart (order not valid for inpatient use)     Chief Complaint  Patient presents with   Acute Visit    Abdominal rash    HPI:  Pt is a 88 y.o. female seen today for an acute visit for reported loose bowel, abd rash.     Dry eyes, Hx of Sjogren syndrome, artificial tears has little efficacy, has ophthalmology appointment.    Prolapsed rectum, avoid constipation, usually reduced on its own.              Hx of rena lithiasis, recurrent UTI, urosepsis, treated by ID. 09/19/21 cystoscopy, laser lithotripsy, R ureteral stent/dilatation. Resolved dysuria, decreased urinary frequency.              GERD, takes Pantoprazole 40mg  qd, Famotidine 20mg  qd. yearly FOBT per GI, Hgb 9.9 01/11/22             Parkinson's, takes Sinemet, MiraPex f/u Dr. Carles Collet             RLS stable on MiraPex, Sinemet hs, better sleep at night.              Depression/hallucination, stabilizing, takes Sertraline,  Quetiapine. Change Tramadol to prn helped.              Constipation, takes Senokot S, Amitiza, Colace, MiraLax              Hypothyroidism, takes Levothyroxine 51mcg qd. TSH 1.67 01/11/22             Erosive arthritis, takes Plaquenil, Tylenol  f/u Rheumatology.              CHF/trace edema BLE, takes Furosemide, cardiology, severe aortic valve stenosis. Echocardiogram 11/15/20 EF 60-65%. Bun/creat 14/0.85 09/27/21             Anemia, takes Fe, Hgb 9.9 01/11/22             AS Cardiology palliative approach. More noticeable DOE, fatigue.     Past Medical History:  Diagnosis Date   Abnormal nuclear stress test  Actinic cheilitis 04/23/2019   Anemia    iron deficiency    Anxiety    Aortic stenosis    severe by echo 2021    Arthritis    Broken arm    right    Chronic diastolic CHF (congestive heart failure) ([place])    Chronic pain    Cognitive communication deficit    Cognitive deficits    Communication   Constipation    Depression    Erosive (osteo)arthritis 03/09/2020   Family history of adverse reaction to anesthesia    pt. states sister vomits   Fibromyalgia    GERD (gastroesophageal reflux disease)    H/O hiatal hernia    Hallucinations, visual 07/21/2018   03/10/20 psych consult.    Hematuria 05/18/2020   History of bronchitis    History of kidney stones    Hypercholesterolemia    Hypertension    dr  t turner   Hypothyroidism    Memory loss 04/21/2018   08/08/18 CT head no traumatic findings, atrophy.  09/27/19 MMSE 25/30, failed the clock drawing   Microhematuria 03/24/2015   Neuropathy    Osteoporosis    Parkinson disease (HCC)    PVD (peripheral vascular disease) (HCC)    99% stenosis of left anteiror tibial artery, mod stenosis of left distal SFA and popliteal artery followed by [name]   RBBB    noted on EKG 2018   Restless leg syndrome    Sepsis ([place])    hx of due to Ecoli   Septic shock ([place]) 04/19/2020   Shortness of breath    with exertion - chronic    Sjogren's disease ([place])    Sjogren's syndrome ([place]) 02/04/2018   04/16/19 rheumatology: Erosive OA of hands, Sjogrens syndrome, low back pain at multiple sites, recurrent kidney stones, age related osteoporosis w/o current pathological fracture, f/u 6 months.    SOB (shortness of breath)    chronic due to diastolic dysfunction, deconditioning, obesity   Spinal stenosis    lumbar region    Tremor    Unstable gait 04/21/2018   Unsteadiness on feet    Urinary frequency 09/18/2018   Urinary tract infection    Visual hallucinations    Past Surgical History:  Procedure Laterality Date   ABDOMINAL HYSTERECTOMY     BACK SURGERY     4 back surgeries,   lumbar fusion   CARDIAC CATHETERIZATION  2006   normal   CYSTOSCOPY/URETEROSCOPY/HOLMIUM LASER/STENT PLACEMENT Left 01/06/2019   Procedure: CYSTOSCOPY/RETROGRADE/URETEROSCOPY/HOLMIUM LASER/BASKET RETRIEVAL/STENT PLACEMENT;  Surgeon: Ceasar Mons, MD;  Location: [facility];  Service: Urology;  Laterality: Left;   CYSTOSCOPY/URETEROSCOPY/HOLMIUM LASER/STENT PLACEMENT Bilateral 05/24/2020   Procedure: CYSTOSCOPY/RETROGRADE/URETEROSCOPY/HOLMIUM LASER/STENT PLACEMENT;  Surgeon: Ceasar Mons, MD;  Location: [facility];  Service: Urology;  Laterality: Bilateral;   CYSTOSCOPY/URETEROSCOPY/HOLMIUM LASER/STENT PLACEMENT Right 09/27/2021    Procedure: CYSTOSCOPY/BILATERAL RETROGRADE/URETEROSCOPY/HOLMIUM LASER/STENT PLACEMENT;  Surgeon: Ceasar Mons, MD;  Location: [facility];  Service: Urology;  Laterality: Right;  ONLY NEEDS 60 MIN   EYE SURGERY Bilateral    cateracts   falls     variious fall, broken wrist,and toes   FRACTURE SURGERY Right April 2016   Wrist, Pt. fell   IR NEPHROSTOMY PLACEMENT RIGHT  04/19/2020   kidney stone removal Left 01/20/2019   RIGHT/LEFT HEART CATH AND CORONARY ANGIOGRAPHY N/A 02/05/2018   Procedure: RIGHT/LEFT HEART CATH AND CORONARY ANGIOGRAPHY;  Surgeon: Troy Sine, MD;  Location: [facility];  Service: Cardiovascular;  Laterality: N/A;   SPINAL  CORD STIMULATOR INSERTION N/A 09/09/2015   Procedure: LUMBAR SPINAL CORD STIMULATOR INSERTION;  Surgeon: Clydell Hakim, MD;  Location: [facility];  Service: Neurosurgery;  Laterality: N/A;  LUMBAR SPINAL CORD STIMULATOR INSERTION   SPINE SURGERY  April 2013   Back X's 4   TOTAL HIP ARTHROPLASTY     right   WRIST FRACTURE SURGERY Bilateral     Allergies  Allergen Reactions   Adrenalone    Lactose Other (See Comments)    abd pain, lactose intolerant   Morphine Other (See Comments)   Sulfa Antibiotics Other (See Comments)    Headache, very sick   Codeine Other (See Comments)    Reaction:  Headaches and nightmares    Lactose Intolerance (Gi) Nausea And Vomiting   Latex Rash   Lyrica [Pregabalin] Swelling and Other (See Comments)    Reaction:  Leg swelling   Other Other (See Comments)    Pt states that pain medications give her nightmares.     Plaquenil [Hydroxychloroquine Sulfate] Other (See Comments)    Reaction:  GI upset    Reglan [Metoclopramide] Other (See Comments)    Reaction:  GI upset    Requip [Ropinirole Hcl] Other (See Comments)    Reaction:  GI upset    Septra [Sulfamethoxazole-Trimethoprim] Nausea And Vomiting   Shellfish Allergy Nausea And Vomiting    Allergies as of 02/05/2022        Reactions   Adrenalone    Lactose Other (See Comments)   abd pain, lactose intolerant   Morphine Other (See Comments)   Sulfa Antibiotics Other (See Comments)   Headache, very sick   Codeine Other (See Comments)   Reaction:  Headaches and nightmares    Lactose Intolerance (gi) Nausea And Vomiting   Latex Rash   Lyrica [pregabalin] Swelling, Other (See Comments)   Reaction:  Leg swelling   Other Other (See Comments)   Pt states that pain medications give her nightmares.     Plaquenil [hydroxychloroquine Sulfate] Other (See Comments)   Reaction:  GI upset    Reglan [metoclopramide] Other (See Comments)   Reaction:  GI upset    Requip [ropinirole Hcl] Other (See Comments)   Reaction:  GI upset    Septra [sulfamethoxazole-trimethoprim] Nausea And Vomiting   Shellfish Allergy Nausea And Vomiting        Medication List        Accurate as of February 05, 2022 11:59 PM. If you have any questions, ask your nurse or doctor.          STOP taking these medications    AeroChamber MV inhaler Stopped by: Tilmon Wisehart X Kiyoshi Schaab, NP   nystatin powder Commonly known as: MYCOSTATIN/NYSTOP Stopped by: Ardys [place] X Caleen Taaffe, NP       TAKE these medications    acetaminophen 500 MG tablet Commonly known as: TYLENOL Take 500 mg by mouth See admin instructions. Take 1 tablet (500 mg) by mouth scheduled 3 times daily scheduled & 3 times daily as needed for pain (Not to exceed 3g/24 hrs)   Artificial Tears 5-6 MG/ML Soln Generic drug: Polyvinyl Alcohol-Povidone Apply 1 drop to eye 3 (three) times daily.   aspirin 81 MG chewable tablet Chew 81 mg by mouth daily.   Calcium 600/Vitamin D3 600-20 MG-MCG Tabs Generic drug: Calcium Carb-Cholecalciferol Take 1 tablet by mouth daily.   carbidopa-levodopa 25-100 MG tablet Commonly known as: SINEMET IR Take 1 tablet by mouth 3 (three) times daily.   docusate sodium 100 MG capsule Commonly  known as: COLACE Take 100 mg by mouth at bedtime.   eucerin  cream Apply 1 application topically daily.   famotidine 20 MG tablet Commonly known as: PEPCID Take 20 mg by mouth daily.   furosemide 20 MG tablet Commonly known as: LASIX Take 1 tablet (20 mg total) by mouth daily.   hydroxychloroquine 200 MG tablet Commonly known as: PLAQUENIL Take 200 mg by mouth daily.   lactose free nutrition Liqd Take by mouth 2 (two) times daily. 1/2 box; Resident likes chocolate. Twice A Day   levothyroxine 50 MCG tablet Commonly known as: SYNTHROID Take 50 mcg by mouth daily before breakfast.   loperamide 2 MG tablet Commonly known as: IMODIUM A-D Take 2 mg by mouth every 6 (six) hours as needed for diarrhea or loose stools.   lubiprostone 24 MCG capsule Commonly known as: AMITIZA Take 24 mcg by mouth 2 (two) times daily with a meal.   multivitamin with minerals Tabs tablet Take 1 tablet by mouth daily.   pantoprazole 40 MG tablet Commonly known as: PROTONIX Take 40 mg by mouth daily.   polyethylene glycol 17 g packet Commonly known as: MIRALAX / GLYCOLAX Take 17 g by mouth daily.   potassium citrate 10 MEQ (1080 MG) SR tablet Commonly known as: UROCIT-K Take 10 mEq by mouth 2 (two) times daily.   PRESERVISION AREDS 2 PO Take 1 tablet by mouth daily.   QUEtiapine 25 MG tablet Commonly known as: SEROQUEL Take 25 mg by mouth See admin instructions.   QUEtiapine 25 MG tablet Commonly known as: SEROQUEL Take 12.5 mg by mouth every morning.   rosuvastatin 20 MG tablet Commonly known as: CRESTOR Take 10 mg by mouth daily.   saccharomyces boulardii 250 MG capsule Commonly known as: FLORASTOR Take 250 mg by mouth daily.   senna-docusate 8.6-50 MG tablet Commonly known as: Senokot-S Take 1 tablet by mouth at bedtime.   sertraline 50 MG tablet Commonly known as: ZOLOFT Take 100 mg by mouth at bedtime.   Slow Fe 142 (45 Fe) MG Tbcr Generic drug: Ferrous Sulfate Take 142 mg by mouth in the morning.   Systane 0.4-0.3 %  Soln Generic drug: Polyethyl Glycol-Propyl Glycol Apply 1 drop to eye 2 (two) times daily as needed (dry eyes).   Vitamin D 50 MCG (2000 UT) tablet Take 2,000 Units by mouth daily.        Review of Systems  Constitutional:  Negative for activity change, appetite change and fever.  HENT:  Positive for hearing loss. Negative for congestion and voice change.   Eyes:  Negative for visual disturbance.  Respiratory:  Positive for shortness of breath. Negative for cough.        Chronic DOE  Cardiovascular:  Positive for leg swelling.  Gastrointestinal:  Negative for abdominal pain, nausea and vomiting.       Had loose BM  Genitourinary:  Positive for frequency. Negative for dysuria and urgency.  Musculoskeletal:  Positive for arthralgias, back pain, gait problem and myalgias.       Chronic lower back pain. The R 3rd finger mid knuckle pain is chronic.   Skin:  Positive for rash.  Neurological:  Positive for tremors. Negative for speech difficulty, weakness and headaches.       Memory lapses. Minimal resting tremor in fingers, not disabling. RLS  Psychiatric/Behavioral:  Positive for hallucinations. Negative for sleep disturbance. The patient is not nervous/anxious.    Immunization History  Administered Date(s) Administered   Hepatitis A, Adult  03/01/1997, 09/20/1997   Influenza Split 09/09/2013, 09/27/2014   Influenza, High Dose Seasonal PF 08/01/2011, 08/27/2012, 10/14/2013, 09/08/2015, 09/10/2016, 09/12/2017, 09/12/2018, 09/15/2019   Influenza,inj,Quad PF,6+ Mos 09/11/2016   Influenza-Unspecified 09/21/2020, 09/28/2021   Moderna Sars-Covid-2 Vaccination 12/12/2019, 01/09/2020, 10/18/2020, 05/09/2021, 08/29/2021   Pneumococcal Conjugate-13 04/15/2014   Pneumococcal Polysaccharide-23 09/28/2005   Td 06/20/2016   Tdap 04/29/2006, 09/01/2015   Zoster Recombinat (Shingrix) 06/11/2018, 09/10/2018   Zoster, Live 04/29/2006   Pertinent  Health Maintenance Due  Topic Date  Due   INFLUENZA VACCINE  Completed   DEXA SCAN  Completed   Fall Risk 08/03/2021 08/04/2021 08/09/2021 09/27/2021 10/10/2021  Falls in the past year? 1 1 1  - 1  Was there an injury with Fall? 1 1 1  - 0  Was there an injury with Fall? - - - - -  Fall Risk Category Calculator 2 3 2  - 2  Fall Risk Category Moderate High Moderate - Moderate  Patient Fall Risk Level - High fall risk High fall risk High fall risk Moderate fall risk  Patient at Risk for Falls Due to - - History of fall(s) - -  Patient at Risk for Falls Due to - - - - -  Fall risk Follow up - - Falls evaluation completed;Education provided - -   Functional Status Survey:    Vitals:   02/05/22 1618  BP: 124/68  Pulse: 64  Resp: 16  Temp: 97.7 F (36.5 C)  SpO2: 96%  Weight: 133 lb 8 oz (60.6 kg)  Height: 5\' 2"  (1.575 m)   Body mass index is 24.42 kg/m. Physical Exam Vitals and nursing note reviewed.  Constitutional:      Appearance: Normal appearance.  HENT:     Head: Normocephalic and atraumatic.     Nose: Nose normal.     Mouth/Throat:     Mouth: Mucous membranes are moist.  Eyes:     General:        Right eye: No discharge.        Left eye: No discharge.     Extraocular Movements: Extraocular movements intact.     Conjunctiva/sclera: Conjunctivae normal.     Pupils: Pupils are equal, round, and reactive to light.  Cardiovascular:     Rate and Rhythm: Normal rate and regular rhythm.     Heart sounds: Murmur heard.  Pulmonary:     Effort: Pulmonary effort is normal.     Breath sounds: No rales.  Abdominal:     General: Bowel sounds are normal.     Palpations: Abdomen is soft.     Tenderness: There is no abdominal tenderness.  Genitourinary:    Comments: Hx of reported protruded rectum was reduced on its own, a external hemorrhoid at 7pm, no s/s of injury.  Musculoskeletal:        General: Tenderness present.     Cervical back: Normal range of motion and neck supple.     Right lower leg: Edema  present.     Left lower leg: Edema present.     Comments: Trace edema BLE.  Arthritic changes in fingers, R>L. The R 3rd finger mid knuckle swelling, no heat or redness. Chronic lower back pain.   Skin:    General: Skin is warm and dry.     Findings: Erythema and rash present.     Comments: Mild redness in abd skin folds, groins, under breasts.   Neurological:     General: No focal deficit present.     Mental  Status: She is alert. Mental status is at baseline.     Gait: Gait abnormal.     Comments: Oriented to person, place. Ambulates with walker.   Psychiatric:        Mood and Affect: Mood normal.        Behavior: Behavior normal.     Comments: Visual hallucination vs delusion    Labs reviewed: Recent Labs    04/12/21 0005 09/27/21 0720 01/11/22 0000  NA 140 140 141  K 4.2 4.3 4.4  CL 104 108 106  CO2 25* 24 24*  GLUCOSE  --  90  --   BUN 18 14 20   CREATININE 0.9 0.85 1.0  CALCIUM 9.8 9.4 9.5   Recent Labs    04/12/21 0005 01/11/22 0000  AST 19  --   ALT 8  --   ALKPHOS 78  --   ALBUMIN 4.1 3.8   Recent Labs    04/12/21 0005 09/27/21 0720 10/05/21 0000 10/09/21 0000 11/23/21 0000  WBC 6.7 5.0 5.1 5.4 6.9  NEUTROABS 4,147.00  --  2,739.00 2,538.00  --   HGB 11.0* 8.9* 8.0* 8.0* 10.1*  HCT 34* 29.4* 26* 26* 31*  MCV  --  98.3  --   --   --   PLT 282 307 253 281 268   Lab Results  Component Value Date   TSH 1.67 01/11/2022   No results found for: HGBA1C Lab Results  Component Value Date   CHOL 137 01/11/2022   HDL 47 01/11/2022   LDLCALC 64 01/11/2022   TRIG 184 (A) 01/11/2022   CHOLHDL 2.4 07/14/2019    Significant Diagnostic Results in last 30 days:  No results found.  Assessment/Plan: Heat rash Apply Nystatin powder daily to abd skin folds, under breasts, groins daily.   Chronic constipation Hold senokot S for loose stools.  Continue  Amitiza, Colace, MiraLax   Sjogren's syndrome (HCC) Dry eyes, Hx of Sjogren syndrome, artificial tears  has little efficacy, has ophthalmology appointment.    Rectal prolapse Prolapsed rectum, avoid constipation, usually reduced on its own  Renal lithiasis Hx of rena lithiasis, recurrent UTI, urosepsis, treated by ID. 09/19/21 cystoscopy, laser lithotripsy, R ureteral stent/dilatation. Resolved dysuria, decreased urinary frequency.   GERD (gastroesophageal reflux disease)  takes Pantoprazole 40mg  qd, Famotidine 20mg  qd. yearly FOBT per GI, Hgb 9.9 01/11/22  Parkinson's disease ([place]) takes Sinemet, MiraPex f/u Dr. Carles Collet  Restless leg syndrome stable on MiraPex, Sinemet hs, better sleep at night.   Depression, psychotic ([place])  stabilizing, takes Sertraline,  Quetiapine. Change Tramadol to prn helped.   Hypothyroidism takes Levothyroxine 18mcg qd. TSH 1.67 01/11/22  Erosive (osteo)arthritis  takes Plaquenil, Tylenol  f/u Rheumatology.   Chronic diastolic CHF (congestive heart failure) (HCC) trace edema BLE, takes Furosemide, cardiology, severe aortic valve stenosis. Echocardiogram 11/15/20 EF 60-65%. Bun/creat 14/0.85 09/27/21  Iron deficiency anemia takes Fe, Hgb 9.9 01/11/22  Aortic stenosis Cardiology palliative approach. More noticeable DOE, fatigue.     Family/ staff Communication: plan of care reviewed with the patient and charge nurse.   Labs/tests ordered:  none  Time spend 25 minutes.

## 2022-02-05 NOTE — Assessment & Plan Note (Addendum)
Hold senokot S for loose stools.  Continue  Amitiza, Colace, MiraLax

## 2022-02-05 NOTE — Assessment & Plan Note (Signed)
Apply Nystatin powder daily to abd skin folds, under breasts, groins daily.

## 2022-02-06 ENCOUNTER — Encounter: Payer: Self-pay | Admitting: Nurse Practitioner

## 2022-02-06 NOTE — Assessment & Plan Note (Signed)
takes Plaquenil, Tylenol  f/u Rheumatology.

## 2022-02-06 NOTE — Assessment & Plan Note (Signed)
Prolapsed rectum, avoid constipation, usually reduced on its own

## 2022-02-06 NOTE — Assessment & Plan Note (Signed)
trace edema BLE, takes Furosemide, cardiology, severe aortic valve stenosis. Echocardiogram 11/15/20 EF 60-65%. Bun/creat 14/0.85 09/27/21

## 2022-02-06 NOTE — Assessment & Plan Note (Signed)
stabilizing, takes Sertraline,  Quetiapine. Change Tramadol to prn helped.

## 2022-02-06 NOTE — Assessment & Plan Note (Signed)
Hx of rena lithiasis, recurrent UTI, urosepsis, treated by ID. 09/19/21 cystoscopy, laser lithotripsy, R ureteral stent/dilatation. Resolved dysuria, decreased urinary frequency.

## 2022-02-06 NOTE — Assessment & Plan Note (Signed)
takes Sinemet, MiraPex f/u Dr. Carles Collet

## 2022-02-06 NOTE — Assessment & Plan Note (Signed)
stable on MiraPex, Sinemet hs, better sleep at night.  

## 2022-02-06 NOTE — Assessment & Plan Note (Signed)
takes Levothyroxine 35mcg qd. TSH 1.67 01/11/22

## 2022-02-06 NOTE — Assessment & Plan Note (Signed)
takes Fe, Hgb 9.9 01/11/22

## 2022-02-06 NOTE — Assessment & Plan Note (Signed)
Dry eyes, Hx of Sjogren syndrome, artificial tears has little efficacy, has ophthalmology appointment.

## 2022-02-06 NOTE — Assessment & Plan Note (Signed)
takes Pantoprazole 40mg  qd, Famotidine 20mg  qd. yearly FOBT per GI, Hgb 9.9 01/11/22

## 2022-02-06 NOTE — Assessment & Plan Note (Signed)
Cardiology palliative approach. More noticeable DOE, fatigue.

## 2022-03-01 ENCOUNTER — Other Ambulatory Visit: Payer: Self-pay

## 2022-03-01 ENCOUNTER — Ambulatory Visit (INDEPENDENT_AMBULATORY_CARE_PROVIDER_SITE_OTHER): Payer: Medicare Other | Admitting: Podiatry

## 2022-03-01 DIAGNOSIS — M79674 Pain in right toe(s): Secondary | ICD-10-CM

## 2022-03-01 DIAGNOSIS — M79675 Pain in left toe(s): Secondary | ICD-10-CM | POA: Diagnosis not present

## 2022-03-01 DIAGNOSIS — B351 Tinea unguium: Secondary | ICD-10-CM | POA: Diagnosis not present

## 2022-03-01 DIAGNOSIS — G629 Polyneuropathy, unspecified: Secondary | ICD-10-CM

## 2022-03-03 NOTE — Progress Notes (Signed)
Subjective: ?88 y.o. returns the office today for painful, elongated, thickened toenails which she cannot trim herself.  She states that the nail of the right side grows out on the big toenail causes discomfort.  No swelling or redness or any drainage.  No open lesions.  She gets burning sensations to her feet.  She previously has been on gabapentin but not currently.  No other concerns. ? ?PCP: [name], MD  ? ?Objective: ?AAO ?3, NAD ?DP/PT pulses palpable, CRT less than 3 seconds ?Mild chronic bilateral lower extremity edema present. ?Nails hypertrophic, dystrophic, elongated, brittle, discolored ?9. There is tenderness overlying the nails 1-5 on the right and 2 through 5 on the left.   ?Prominent metatarsal heads plantarly. ?No open lesions or pre-ulcerative lesions are identified. ?No significant areas of tenderness today otherwise. ?No pain with calf compression, warmth, erythema. ? ?Assessment: ?Patient presents with symptomatic onychomycosis ? ?Plan: ?-Treatment options including alternatives, risks, complications were discussed ?-Nails sharply debrided ?9 without complication/bleeding.  Discussed nail removal but ultimately decided to hold off on this today.  We will can continue with routine debridement. ?-Follow-up with primary care physician regarding neuropathy and other medications to help with this. ?-Continue daily foot inspection, general stretching, rehab as well as shoes with good arch support. ? ?Trula Slade DPM ? ?

## 2022-03-05 ENCOUNTER — Encounter: Payer: Self-pay | Admitting: Nurse Practitioner

## 2022-03-05 ENCOUNTER — Non-Acute Institutional Stay (SKILLED_NURSING_FACILITY): Payer: Medicare Other | Admitting: Nurse Practitioner

## 2022-03-05 DIAGNOSIS — E7800 Pure hypercholesterolemia, unspecified: Secondary | ICD-10-CM | POA: Diagnosis not present

## 2022-03-05 DIAGNOSIS — M154 Erosive (osteo)arthritis: Secondary | ICD-10-CM | POA: Diagnosis not present

## 2022-03-05 DIAGNOSIS — K219 Gastro-esophageal reflux disease without esophagitis: Secondary | ICD-10-CM | POA: Diagnosis not present

## 2022-03-05 DIAGNOSIS — I5032 Chronic diastolic (congestive) heart failure: Secondary | ICD-10-CM | POA: Diagnosis not present

## 2022-03-05 DIAGNOSIS — D508 Other iron deficiency anemias: Secondary | ICD-10-CM

## 2022-03-05 DIAGNOSIS — K623 Rectal prolapse: Secondary | ICD-10-CM

## 2022-03-05 DIAGNOSIS — F323 Major depressive disorder, single episode, severe with psychotic features: Secondary | ICD-10-CM

## 2022-03-05 DIAGNOSIS — N200 Calculus of kidney: Secondary | ICD-10-CM

## 2022-03-05 DIAGNOSIS — I350 Nonrheumatic aortic (valve) stenosis: Secondary | ICD-10-CM

## 2022-03-05 DIAGNOSIS — G2581 Restless legs syndrome: Secondary | ICD-10-CM

## 2022-03-05 DIAGNOSIS — G20 Parkinson's disease: Secondary | ICD-10-CM

## 2022-03-05 DIAGNOSIS — E039 Hypothyroidism, unspecified: Secondary | ICD-10-CM

## 2022-03-05 DIAGNOSIS — K5909 Other constipation: Secondary | ICD-10-CM

## 2022-03-05 DIAGNOSIS — M3500 Sicca syndrome, unspecified: Secondary | ICD-10-CM

## 2022-03-05 NOTE — Assessment & Plan Note (Addendum)
avoid constipation, usually reduced on its own.  

## 2022-03-05 NOTE — Assessment & Plan Note (Signed)
takes Pantoprazole '40mg'$  qd, Famotidine '20mg'$  qd. yearly FOBT per GI, Hgb 9.9 01/11/22 ?

## 2022-03-05 NOTE — Assessment & Plan Note (Addendum)
aches and pains,  takes Plaquenil, Tylenol  f/u Rheumatology.  

## 2022-03-05 NOTE — Assessment & Plan Note (Signed)
stabilizing, takes Sertraline,  Quetiapine. Change Tramadol to prn helped. TSH 1.67 2.2.23 ?

## 2022-03-05 NOTE — Assessment & Plan Note (Addendum)
Occasional symptomatic, on MiraPex, Sinemet hs, better sleep at night.  ?

## 2022-03-05 NOTE — Assessment & Plan Note (Signed)
Hx of rena lithiasis, recurrent UTI, urosepsis, treated by ID. 09/19/21 cystoscopy, laser lithotripsy, R ureteral stent/dilatation. Resolved dysuria, decreased urinary frequency. ?

## 2022-03-05 NOTE — Assessment & Plan Note (Signed)
Able to feed self, difficulty with gait but DOE contributory to less walking, takes Sinemet, MiraPex f/u Dr. Carles Collet ?

## 2022-03-05 NOTE — Assessment & Plan Note (Signed)
Stable,  takes Senokot S, Amitiza, Colace, MiraLax  

## 2022-03-05 NOTE — Assessment & Plan Note (Signed)
takes Fe, Hgb 9.9 01/11/22 ?

## 2022-03-05 NOTE — Assessment & Plan Note (Addendum)
trace edema BLE, takes Furosemide, cardiology, severe aortic valve stenosis. Echocardiogram 11/15/20 EF 60-65%. Bun/creat 20/1.0 01/11/22 ?

## 2022-03-05 NOTE — Assessment & Plan Note (Signed)
LDL 64, on Rosuvastatin.  

## 2022-03-05 NOTE — Progress Notes (Signed)
?Location:   [facility] ?Nursing Home Room Number: 45 A ?Place of Service:  SNF (31) ?Provider:  Virginio Isidore X, NP ? ?[name], MD ? ?Patient Care Team: ?[name], MD as PCP - General (Internal Medicine) ?[name], MD as PCP - Cardiology (Cardiology) ?[name], MD (Neurosurgery) ?Penumalli, Earlean Polka, MD (Neurology) ?[name], MD (Inactive) as Consulting Physician (Gastroenterology) ?[name], MD as Consulting Physician (Urology) ?Clarie Camey X, NP as Nurse Practitioner (Internal Medicine) ?Ludwig Clarks, DO as Consulting Physician (Neurology) ? ?Extended Emergency Contact Information ?Primary Emergency Contact: Pica,Patrick ?Address: [place] ?         [place], [place] [address] [place] ?Home Phone: [phone] ?Mobile Phone: [phone] ?Relation: Spouse ?Secondary Emergency Contact: [name] ?Mobile Phone: [phone] ?Relation: Daughter ?Preferred language: English ?Interpreter needed? No ? ?Code Status:  FULL CODE ?Goals of care: Advanced Directive information ? ?  03/05/2022  ? 11:25 AM  ?Advanced Directives  ?Does Patient Have a Medical Advance Directive? Yes  ?Type of Advance Directive Living will;Out of facility DNR (pink MOST or yellow form)  ?Does patient want to make changes to medical advance directive? No - Patient declined  ?Pre-existing out of facility DNR order (yellow form or pink MOST form) Yellow form placed in chart (order not valid for inpatient use)  ? ? ? ?Chief Complaint  ?Patient presents with  ? Medical Management of Chronic Issues  ?  Routine follow up  ? Immunizations  ?  4th COVID booster  ? ? ?HPI:  ?Pt is a 88 y.o. female seen today for medical management of chronic diseases.   ? Dry eyes, c/o discomfort sometimes, uses artificial tears.  ?Prolapsed rectum, avoid constipation, usually reduced on its own.  ?            Hx of rena lithiasis, recurrent UTI, urosepsis, treated by ID. 09/19/21  cystoscopy, laser lithotripsy, R ureteral stent/dilatation. Resolved dysuria, decreased urinary frequency.  ?            GERD, takes Pantoprazole '40mg'$  qd, Famotidine '20mg'$  qd. yearly FOBT per GI, Hgb 9.9 01/11/22 ?            Parkinson's, takes Sinemet, MiraPex f/u Dr. Carles Collet ?            RLS stable on MiraPex, Sinemet hs, better sleep at night.  ?            Depression/hallucination, stabilizing, takes Sertraline,  Quetiapine. Change Tramadol to prn helped. TSH 1.67 2.2.23 ?            Constipation, takes Senokot S, Amitiza, Colace, MiraLax  ?            Hypothyroidism, takes Levothyroxine 86mg qd. TSH 1.67 01/11/22 ?            Erosive arthritis, aches and pains,  takes Plaquenil, Tylenol  f/u Rheumatology.  ?            CHF/trace edema BLE, takes Furosemide, cardiology, severe aortic valve stenosis. Echocardiogram 11/15/20 EF 60-65%. Bun/creat 20/1.0 01/11/22 ? Hyperlipidemia, LDL 64, on Rosuvastatin.  ?            Anemia, takes Fe, Hgb 9.9 01/11/22 ?            AS Cardiology palliative approach. More noticeable DOE, fatigue.  ? ?Past Medical History:  ?Diagnosis Date  ? Abnormal nuclear stress test   ? Actinic cheilitis 04/23/2019  ? Anemia   ?  iron deficiency   ? Anxiety   ? Aortic stenosis   ? severe by echo 2021   ? Arthritis   ? Broken arm   ? right   ? Chronic diastolic CHF (congestive heart failure) ([place])   ? Chronic pain   ? Cognitive communication deficit   ? Cognitive deficits   ? Communication  ? Constipation   ? Depression   ? Erosive (osteo)arthritis 03/09/2020  ? Family history of adverse reaction to anesthesia   ? pt. states sister vomits  ? Fibromyalgia   ? GERD (gastroesophageal reflux disease)   ? H/O hiatal hernia   ? Hallucinations, visual 07/21/2018  ? 03/10/20 psych consult.   ? Hematuria 05/18/2020  ? History of bronchitis   ? History of kidney stones   ? Hypercholesterolemia   ? Hypertension   ? dr t turner  ? Hypothyroidism   ? Memory loss 04/21/2018  ? 08/08/18 CT head no traumatic findings, atrophy.   09/27/19 MMSE 25/30, failed the clock drawing  ? Microhematuria 03/24/2015  ? Neuropathy   ? Osteoporosis   ? Parkinson disease ([place])   ? PVD (peripheral vascular disease) ([place])   ? 99% stenosis of left anteiror tibial artery, mod stenosis of left distal SFA and popliteal artery followed by [name]  ? RBBB   ? noted on EKG 2018  ? Restless leg syndrome   ? Sepsis ([place])   ? hx of due to [facility]  ? Septic shock ([place]) 04/19/2020  ? Shortness of breath   ? with exertion - chronic   ? Sjogren's disease ([place])   ? Sjogren's syndrome ([place]) 02/04/2018  ? 04/16/19 rheumatology: Erosive OA of hands, Sjogrens syndrome, low back pain at multiple sites, recurrent kidney stones, age related osteoporosis w/o current pathological fracture, f/u 6 months.   ? SOB (shortness of breath)   ? chronic due to diastolic dysfunction, deconditioning, obesity  ? Spinal stenosis   ? lumbar region   ? Tremor   ? Unstable gait 04/21/2018  ? Unsteadiness on feet   ? Urinary frequency 09/18/2018  ? Urinary tract infection   ? Visual hallucinations   ? ?Past Surgical History:  ?Procedure Laterality Date  ? ABDOMINAL HYSTERECTOMY    ? BACK SURGERY    ? 4 back surgeries,   lumbar fusion  ? CARDIAC CATHETERIZATION  2006  ? normal  ? CYSTOSCOPY/URETEROSCOPY/HOLMIUM LASER/STENT PLACEMENT Left 01/06/2019  ? Procedure: CYSTOSCOPY/RETROGRADE/URETEROSCOPY/HOLMIUM LASER/BASKET RETRIEVAL/STENT PLACEMENT;  Surgeon: Ceasar Mons, MD;  Location: [facility];  Service: Urology;  Laterality: Left;  ? CYSTOSCOPY/URETEROSCOPY/HOLMIUM LASER/STENT PLACEMENT Bilateral 05/24/2020  ? Procedure: BZJIRCVELF/YBOFBPZWCH/ENIDPOEUMPNT/IRWERXV LASER/STENT PLACEMENT;  Surgeon: Ceasar Mons, MD;  Location: [facility];  Service: Urology;  Laterality: Bilateral;  ? CYSTOSCOPY/URETEROSCOPY/HOLMIUM LASER/STENT PLACEMENT Right 09/27/2021  ? Procedure: CYSTOSCOPY/BILATERAL RETROGRADE/URETEROSCOPY/HOLMIUM LASER/STENT PLACEMENT;  Surgeon: Ceasar Mons, MD;   Location: [facility];  Service: Urology;  Laterality: Right;  ONLY NEEDS 60 MIN  ? EYE SURGERY Bilateral   ? cateracts  ? falls    ? variious fall, broken wrist,and toes  ? FRACTURE SURGERY Right April 2016  ? Wrist, Pt. fell  ? IR NEPHROSTOMY PLACEMENT RIGHT  04/19/2020  ? kidney stone removal Left 01/20/2019  ? RIGHT/LEFT HEART CATH AND CORONARY ANGIOGRAPHY N/A 02/05/2018  ? Procedure: RIGHT/LEFT HEART CATH AND CORONARY ANGIOGRAPHY;  Surgeon: Troy Sine, MD;  Location: [facility];  Service: Cardiovascular;  Laterality: N/A;  ? SPINAL CORD STIMULATOR INSERTION N/A 09/09/2015  ? Procedure: LUMBAR  SPINAL CORD STIMULATOR INSERTION;  Surgeon: Clydell Hakim, MD;  Location: [facility];  Service: Neurosurgery;  Laterality: N/A;  LUMBAR SPINAL CORD STIMULATOR INSERTION  ? SPINE SURGERY  April 2013  ? Back X's 4  ? TOTAL HIP ARTHROPLASTY    ? right  ? WRIST FRACTURE SURGERY Bilateral   ? ? ?Allergies  ?Allergen Reactions  ? Adrenalone   ? Lactose Other (See Comments)  ?  abd pain, lactose intolerant  ? Morphine Other (See Comments)  ? Sulfa Antibiotics Other (See Comments)  ?  Headache, very sick  ? Codeine Other (See Comments)  ?  Reaction:  Headaches and nightmares   ? Lactose Intolerance (Gi) Nausea And Vomiting  ? Latex Rash  ? Lyrica [Pregabalin] Swelling and Other (See Comments)  ?  Reaction:  Leg swelling  ? Other Other (See Comments)  ?  Pt states that pain medications give her nightmares.    ? Plaquenil [Hydroxychloroquine Sulfate] Other (See Comments)  ?  Reaction:  GI upset   ? Reglan [Metoclopramide] Other (See Comments)  ?  Reaction:  GI upset   ? Requip [Ropinirole Hcl] Other (See Comments)  ?  Reaction:  GI upset   ? Septra [Sulfamethoxazole-Trimethoprim] Nausea And Vomiting  ? Shellfish Allergy Nausea And Vomiting  ? ? ?Allergies as of 03/05/2022   ? ?   Reactions  ? Adrenalone   ? Lactose Other (See Comments)  ? abd pain, lactose intolerant  ? Morphine Other (See Comments)  ? Sulfa Antibiotics Other  (See Comments)  ? Headache, very sick  ? Codeine Other (See Comments)  ? Reaction:  Headaches and nightmares   ? Lactose Intolerance (gi) Nausea And Vomiting  ? Latex Rash  ? Lyrica [pregabalin] Swelling, Other (See

## 2022-03-05 NOTE — Assessment & Plan Note (Signed)
Dry eyes, uses artificial tears.  ?

## 2022-03-05 NOTE — Assessment & Plan Note (Signed)
Cardiology palliative approach. More noticeable DOE, fatigue.  ?

## 2022-03-05 NOTE — Assessment & Plan Note (Signed)
takes Levothyroxine 68mg qd. TSH 1.67 01/11/22 ?

## 2022-03-08 DIAGNOSIS — N3020 Other chronic cystitis without hematuria: Secondary | ICD-10-CM | POA: Diagnosis not present

## 2022-03-08 DIAGNOSIS — N200 Calculus of kidney: Secondary | ICD-10-CM | POA: Diagnosis not present

## 2022-03-08 DIAGNOSIS — N130 Hydronephrosis with ureteropelvic junction obstruction: Secondary | ICD-10-CM | POA: Diagnosis not present

## 2022-03-13 ENCOUNTER — Ambulatory Visit (INDEPENDENT_AMBULATORY_CARE_PROVIDER_SITE_OTHER): Payer: Medicare Other | Admitting: Neurology

## 2022-03-13 ENCOUNTER — Encounter: Payer: Self-pay | Admitting: Neurology

## 2022-03-13 VITALS — BP 115/72 | HR 71 | Ht 63.0 in | Wt 130.0 lb

## 2022-03-13 DIAGNOSIS — R441 Visual hallucinations: Secondary | ICD-10-CM | POA: Diagnosis not present

## 2022-03-13 DIAGNOSIS — G20 Parkinson's disease: Secondary | ICD-10-CM | POA: Diagnosis not present

## 2022-03-13 NOTE — Patient Instructions (Signed)
Local and Online Resources for Power over Parkinson's Group ?April 2023 ? ?LOCAL [place] PARKINSON'S GROUPS  ?Power over Parkinson's Group:   ?Power Over Parkinson's Patient Education Group will be Wednesday, April 12th-*Hybrid meting*- in person at [facility] location and via [facility] at 2:00 pm.   ?Upcoming Power over Parkinson's Meetings:  2nd Wednesdays of the month at 2 pm:  April 12th, May 10th ?Contact Amy Marriott at [email or web address] if interested in participating in this group ?Parkinson's Care Partners Group:    3rd Mondays, Contact Misty Paladino ?Atypical Parkinsonian Patient Group:   4th Wednesdays, Contact Misty Paladino ?If you are interested in participating in these groups with Misty, please contact her directly for how to join those meetings.  Her contact information is [email or web address].   ? ?LOCAL EVENTS AND NEW OFFERINGS ?Dine out at [employer].  Celebrate Parkinson's disease Awareness Month and Support the Parkinson's Movement Disorder Fund.   Wednesday, April 19th 4-6 pm at [place], [employer].  (Give receipt to cashier and 20% will be donated) ?[place]!  Play [place]!  Join [place] for home game for a fun evening to bring awareness of Parkinson's and raise funds for our Movement Disorder Funds. April 28th 6:30 pm [address]. To purchase tickets:  [email or web address] ?Parkinson's T-shirts for sale!  Designed by a local group member, with funds going to [place].  $20.00  Contact Misty to purchase (see email above) ?New PWR! Moves [employer] Instructor-Led Class offering at [employer]! Starting Wednesdays 1-2 pm, starting April 12th.   Contact Bryson Dames, Acupuncturist at [employer].  [email or web address] [email or web address] ? ?ONLINE EDUCATION AND SUPPORT ?[place]:  www.parkinson.org ?PD Health at Home continues:  Mindfulness Mondays, Expert Briefing Tuesdays,  Wellness Wednesdays, Take Time Thursdays, Fitness Fridays  ?Upcoming Education:  ?Freezing and Fall Prevention in Parkinson's.  Wednesday, April 12th at 1:00 pm ?Understanding Gene and Cell-Based Therapies in Parkinson's.  Wednesday, May 10th at 1:00 pm ?Register for expert briefings (webinars) at [email or web address] ?Please check out their website to sign up for emails and see their full online offerings ? ? ?[place]:  [email or web address]  ?Third Thursday Webinars:  On the third Thursday of every month at 12 p.m. ET, join our free live webinars to learn about various aspects of living with Parkinson's disease and our work to speed medical breakthroughs. ?Upcoming Webinar:  Brainwaves:  The Potential of Focused Ultrasound and Deep Brain Stimulation in PD.  Thursday, April 20th at  12 noon. ?Check out additional information on their website to see their full online offerings ? ?[place]:  www.[email or web address] ?Upcoming Webinar:   Stay tuned ?Webinar Series:  Living with Parkinson's Meetup.   Third Thursdays each month, 3 pm ?Care Partner Monthly Meetup.  With Robin Searing [place].  First Tuesday of each month, 2 pm ?Check out additional information to Live Well Today on their website ? ?Parkinson and Movement Disorders (PMD) Alliance:  www.pmdalliance.org ?[employer]Life Online:  Online Education Events ?Sign up for emails, which are sent weekly to give you updates on programming and online offerings ? ?Parkinson's Association of [place]:  www.parkinsonassociation.org ?Information on online support groups, education events, and online exercises including Yoga, Parkinson's exercises and more-LOTS of information on links to PD resources and online events ?Virtual Support Group through [employer] of the [place]; next one is scheduled for Wednesday, April 5th at 2 pm. (These are typically  scheduled for the 1st Wednesday of the month at 2 pm).  Visit website for  details. ?MOVEMENT AND EXERCISE OPPORTUNITIES ?Parkinson's DRUMMING Classes/Music Therapy with Doylene Canning:  This is a returning class and it's FREE!  2nd Mondays, continuing April 10th  , 11:00 at the [place].  Contact *Misty Taylor-Paladino at [email or web address] or Doylene Canning at [phone] or [email or web address]  ?PWR! Moves Classes at [place].  Wednesdays 10 and 11 am.   Contact Amy Marriott, PT [email or web address] if interested. ?NEW PWR! Moves Class offering at [employer].  Wednesdays 1-2 pm, starting April 12th.  Contact Bryson Dames, Acupuncturist at [employer].  [email or web address] [email or web address] ?Here is a link to the PWR!Moves classes on Zoom from [place] - Daily Mon-Sat at 10:00. Via Zoom, FREE and open to all.  There is also a link below via Facebook if you use that platform. ? ?[email or web address].si ?https://www.[email or web address].no ? ?Parkinson's Wellness Recovery (PWR! Moves)  www.[email or web address] ?Info on the PWR! Virtual Experience:  You will have access to our expertise through self-assessment, guided plans that start with the PD-specific fundamentals, educational content, tips, Q&A with an expert, and a growing Art therapist of PD-specific pre-recorded and live exercise classes of varying types and intensity - both physical and cognitive! If that is not enough, we offer 1:1 wellness consultations (in-person or virtual) to personalize your PWR! Research scientist (medical).  ?[employer] Fridays:  ?As part of the PD Health @ Home program, this free video series focuses each week on one aspect of fitness designed to support people living with  Parkinson's.  These weekly videos highlight the [place] recent fitness guidelines for people with Parkinson's disease. ?www.KVTVnet.com.cy ?Dance for PD website is offering free, live-stream classes throughout the week, as well as links to [employer] of classes:  https://danceforparkinsons.org/ ?Dance for Parkinson's in-person class.  February 1-April 26, Wednesdays 4-5 pm.  Free class for people with Parkinson's disease, at [address] [place], [place].  Contact [phone] or [email or web address] to register ?Virtual dance and Pilates for Parkinson's classes: Click on the Community Tab> Parkinson's Movement Initiative Tab.  To register for classes and for more information, visit www.SeekAlumni.co.za and click the ?community? tab.  ?[employer] Parkinson's Cycling Classes  ?[employer]:  Thursdays @ Noon-Live classes at [employer] ([employer] at [email or web address] or [phone]) ?[facility] [employer] [employer]: Virtual Classes Mondays and Thursdays Jeanette Caprice classes Tuesday, Wednesday and Thursday (contact [place] at [email or web address]  or [phone]) ?[employer] ?Varied levels of classes are offered Mondays, Tuesdays and Thursdays at [employer].  ?Stretching with Verdis Frederickson weekly class is also offered for people with Parkinson's ?To observe a class or for more information, call [phone] or email Hezzie Bump at [email or web address] .[email or web address] ?ADDITIONAL SUPPORT AND RESOURCES ?[employer]:Online Caregiver Education Opportunities:  www.well-springsolutions.org/caregiver-education/caregiver-support-group.  You may also contact Vickki Muff at [email or web address] or [phone].    ?Well-Spring Navigator:  Just1Navigator program, a free service to help individuals and families through the journey of determining care for older adults.  The ?Navigator? is a [phone],  Education officer, museum, who will speak with a prospective client and/or loved ones to provide an assessment of the situation and a set of recommendations for a personalized care plan -- all free of charge, and whether [facility]

## 2022-03-13 NOTE — Progress Notes (Signed)
? ? ?Assessment/Plan:  ? ?1.  Parkinsons Disease ? -Continue carbidopa/levodopa 25/100, 1 tablet 3 times per day ? -Continue carbidopa/levodopa 25/100 CR at bedtime.  Husband states that occ wakes up with tremor and asks about higher dosage but we decided to hold off unless sx happens more often ? -She is only on pramipexole, 0.125 mg at bedtime.  Tried to get her all the way off of that, but she did not do well all the way off of it.  This is probably because we stopped the Ultram at the same time, which was really what was controlling restless leg (she was on 3 times per day Ultram at the time). ? -RX for ST ? ?2.  Hallucinations, likely with dementia ? -She is off of scheduled tramadol and only getting as needed now.  Hallucinations have improved somewhat since doing this. ? -Primary care is already prescribing quetiapine, 12.5 mg in the morning and 25 mg at bedtime. ? -Patient in full-time SNF (husband in independent living at [facility]) ? ? ?Subjective:  ? ?[name] was seen today in follow up for Parkinsons disease.  My previous records were reviewed prior to todays visit as well as outside records available to me.  Her levodopa was restarted last visit, 1 tablet 3 times per day (she had previously been on 2 tablets twice per day).  I wanted to remain off the pramipexole because of hallucinations, although I thought the tramadol was contributing to it, which she had been getting on a scheduled basis, despite the fact that she really had no pain.  Only 2 days after I saw her in the nursing facility called and they wanted to restart the pramipexole because of restless leg.  I declined that because of hallucinations.  I did have them start carbidopa/levodopa 25/100 CR at bedtime.  Her tramadol was reduced to as-needed basis.  They then called about 5 days later stating that the patient was complaining about continued restless leg, but they were not sure if the movements that they saw were voluntary or  involuntary.  Ultimately, I felt the patient's movements were likely because of the fact that we stopped the Ultram.  I did tell them they could add very small dose pramipexole, 0.125 mg at bedtime back.  That was back in November and had not heard from them since.  I have reviewed both physician and nurse practitioner records from the nursing facility.  Physician last saw her end of January.  Physician wrote "hallucinations once or twice a week, which is better for her."  Husband asks what we can do about the quiet voice.   ? ?Current prescribed movement disorder medications: ?Carbidopa/levodopa 25/100, 1 tablet 3 times per day ?Carbidopa/levodopa 25/100 CR at bedtime ?Pramipexole, 0.125 mg at bed ? ?PREVIOUS MEDICATIONS: Sinemet and Mirapex ? ?ALLERGIES:   ?Allergies  ?Allergen Reactions  ? Adrenalone   ? Lactose Other (See Comments)  ?  abd pain, lactose intolerant  ? Morphine Other (See Comments)  ? Sulfa Antibiotics Other (See Comments)  ?  Headache, very sick  ? Codeine Other (See Comments)  ?  Reaction:  Headaches and nightmares   ? Lactose Intolerance (Gi) Nausea And Vomiting  ? Latex Rash  ? Lyrica [Pregabalin] Swelling and Other (See Comments)  ?  Reaction:  Leg swelling  ? Other Other (See Comments)  ?  Pt states that pain medications give her nightmares.    ? Plaquenil [Hydroxychloroquine Sulfate] Other (See Comments)  ?  Reaction:  GI upset   ? Reglan [Metoclopramide] Other (See Comments)  ?  Reaction:  GI upset   ? Requip [Ropinirole Hcl] Other (See Comments)  ?  Reaction:  GI upset   ? Septra [Sulfamethoxazole-Trimethoprim] Nausea And Vomiting  ? Shellfish Allergy Nausea And Vomiting  ? ? ?CURRENT MEDICATIONS:  ?Outpatient Encounter Medications as of 03/13/2022  ?Medication Sig  ? acetaminophen (TYLENOL) 500 MG tablet Take 500 mg by mouth See admin instructions. Take 1 tablet (500 mg) by mouth scheduled 3 times daily scheduled & 3 times daily as needed for pain (Not to exceed 3g/24 hrs)  ? aspirin 81 MG  chewable tablet Chew 81 mg by mouth daily.  ? Calcium Carb-Cholecalciferol (CALCIUM 600/VITAMIN D3) 600-800 MG-UNIT TABS Take 1 tablet by mouth daily.  ? carbidopa-levodopa (SINEMET IR) 25-100 MG tablet Take 1 tablet by mouth 3 (three) times daily.  ? Cholecalciferol (VITAMIN D) 50 MCG (2000 UT) tablet Take 2,000 Units by mouth daily.  ? docusate sodium (COLACE) 100 MG capsule Take 100 mg by mouth at bedtime.  ? Ferrous Sulfate (SLOW FE) 142 (45 Fe) MG TBCR Take 142 mg by mouth in the morning.  ? furosemide (LASIX) 20 MG tablet Take 1 tablet (20 mg total) by mouth daily.  ? hydroxychloroquine (PLAQUENIL) 200 MG tablet Take 200 mg by mouth daily.  ? levothyroxine (SYNTHROID, LEVOTHROID) 50 MCG tablet Take 50 mcg by mouth daily before breakfast.   ? lubiprostone (AMITIZA) 24 MCG capsule Take 24 mcg by mouth 2 (two) times daily with a meal.   ? Multiple Vitamins-Minerals (PRESERVISION AREDS 2 PO) Take 1 tablet by mouth daily.  ? NYAMYC powder Apply topically.  ? pantoprazole (PROTONIX) 40 MG tablet Take 40 mg by mouth daily.  ? Polyethyl Glycol-Propyl Glycol (SYSTANE) 0.4-0.3 % SOLN Apply 1 drop to eye 2 (two) times daily as needed (dry eyes).   ? polyethylene glycol (MIRALAX / GLYCOLAX) 17 g packet Take 17 g by mouth daily.  ? potassium citrate (UROCIT-K) 10 MEQ (1080 MG) SR tablet Take 10 mEq by mouth 2 (two) times daily.  ? pramipexole (MIRAPEX) 0.125 MG tablet Take 0.125 mg by mouth at bedtime.  ? QUEtiapine (SEROQUEL) 25 MG tablet Take 25 mg by mouth See admin instructions.  ? rosuvastatin (CRESTOR) 20 MG tablet Take 10 mg by mouth daily.  ? saccharomyces boulardii (FLORASTOR) 250 MG capsule Take 250 mg by mouth daily.  ? senna-docusate (SENOKOT-S) 8.6-50 MG tablet Take 1 tablet by mouth at bedtime.   ? sertraline (ZOLOFT) 50 MG tablet Take 100 mg by mouth at bedtime.  ? Skin Protectants, Misc. (EUCERIN) cream Apply 1 application topically daily.  ? famotidine (PEPCID) 20 MG tablet Take 20 mg by mouth daily.  ?  furosemide (LASIX) 20 MG tablet Take 20 mg by mouth. Give '20mg'$  PO QD PRN for lower extremity swelling.  ? lactose free nutrition (BOOST) LIQD Take by mouth 2 (two) times daily. 1/2 box; Resident likes chocolate. ?Twice A Day  ? loperamide (IMODIUM A-D) 2 MG tablet Take 2 mg by mouth every 6 (six) hours as needed for diarrhea or loose stools.  ? Multiple Vitamin (MULTIVITAMIN WITH MINERALS) TABS tablet Take 1 tablet by mouth daily.  ? QUEtiapine (SEROQUEL) 25 MG tablet Take 12.5 mg by mouth every morning. (Patient not taking: Reported on 03/13/2022)  ? ?No facility-administered encounter medications on file as of 03/13/2022.  ? ? ?Objective:  ? ?PHYSICAL EXAMINATION:   ? ?VITALS:   ?Vitals:  ?  03/13/22 1440  ?BP: 115/72  ?Pulse: 71  ?SpO2: 97%  ?Weight: 130 lb (59 kg)  ?Height: '5\' 3"'$  (1.6 m)  ? ? ? ?GEN:  The patient appears stated age and is in NAD. ?HEENT:  Normocephalic, atraumatic.  The mucous membranes are moist. The superficial temporal arteries are without ropiness or tenderness. ?CV:  RRR ?Lungs:  CTAB ?Neck/HEME:  There are no carotid bruits bilaterally. ? ?Neurological examination: ? ?Orientation: The patient is alert and oriented x3. ?Cranial nerves: There is good facial symmetry with facial hypomimia. The speech is fluent and clear. Soft palate rises symmetrically and there is no tongue deviation. Hearing is intact to conversational tone. ?Sensation: Sensation is intact to light touch throughout ?Motor: Strength is at least antigravity x4. ? ?Movement examination: ?Tone: mild rigidity in the LUE (not right) but is guarding this arm due to some pain she has had in it last 2 days ?Abnormal movements: None ?Coordination:  There is slowness with all rapid terminating movements and it appears some decremation as well, left greater than right. ?Gait and Station: The patient requires assistance out of the wheelchair.  She has retropulsion with arising (same as last visit).  The right foot sticks to the ground and  has some freezing.  She drags the R leg. ? ?I have reviewed and interpreted the following labs independently ? ?  Chemistry   ?   ?Component Value Date/Time  ? NA 141 01/11/2022 0000  ? K 4.4 01/11/2022

## 2022-03-15 ENCOUNTER — Non-Acute Institutional Stay (SKILLED_NURSING_FACILITY): Payer: Medicare Other | Admitting: Nurse Practitioner

## 2022-03-15 DIAGNOSIS — K219 Gastro-esophageal reflux disease without esophagitis: Secondary | ICD-10-CM

## 2022-03-15 DIAGNOSIS — G2581 Restless legs syndrome: Secondary | ICD-10-CM | POA: Diagnosis not present

## 2022-03-15 DIAGNOSIS — E039 Hypothyroidism, unspecified: Secondary | ICD-10-CM | POA: Diagnosis not present

## 2022-03-15 DIAGNOSIS — R300 Dysuria: Secondary | ICD-10-CM | POA: Diagnosis not present

## 2022-03-15 DIAGNOSIS — K623 Rectal prolapse: Secondary | ICD-10-CM

## 2022-03-15 DIAGNOSIS — K5909 Other constipation: Secondary | ICD-10-CM

## 2022-03-15 DIAGNOSIS — N200 Calculus of kidney: Secondary | ICD-10-CM

## 2022-03-15 DIAGNOSIS — G20 Parkinson's disease: Secondary | ICD-10-CM

## 2022-03-15 DIAGNOSIS — E7800 Pure hypercholesterolemia, unspecified: Secondary | ICD-10-CM

## 2022-03-15 DIAGNOSIS — M3500 Sicca syndrome, unspecified: Secondary | ICD-10-CM | POA: Diagnosis not present

## 2022-03-15 DIAGNOSIS — D508 Other iron deficiency anemias: Secondary | ICD-10-CM

## 2022-03-15 DIAGNOSIS — M154 Erosive (osteo)arthritis: Secondary | ICD-10-CM

## 2022-03-15 DIAGNOSIS — G20A1 Parkinson's disease without dyskinesia, without mention of fluctuations: Secondary | ICD-10-CM

## 2022-03-15 DIAGNOSIS — F323 Major depressive disorder, single episode, severe with psychotic features: Secondary | ICD-10-CM | POA: Diagnosis not present

## 2022-03-15 DIAGNOSIS — I5032 Chronic diastolic (congestive) heart failure: Secondary | ICD-10-CM | POA: Diagnosis not present

## 2022-03-15 DIAGNOSIS — I350 Nonrheumatic aortic (valve) stenosis: Secondary | ICD-10-CM

## 2022-03-15 DIAGNOSIS — N390 Urinary tract infection, site not specified: Secondary | ICD-10-CM | POA: Diagnosis not present

## 2022-03-15 NOTE — Assessment & Plan Note (Addendum)
Dysuria, urinary frequency/urgency, urinary leakage if not getting commode quick enough, lower abd discomfort, will obtain UA C/S ?Urine culture 03/18/22 E coli 10,000-49,000c/ml ?03/19/22 encourage oral fluid intake, observe s/s of UTI.   ?

## 2022-03-15 NOTE — Progress Notes (Signed)
?Location:   SNF [facility] ?Nursing Home Room Number: 45 ?Place of Service:  SNF (31) ?Provider: Marlana Latus NP ? ?[name], MD ? ?Patient Care Team: ?[name], MD as PCP - General (Internal Medicine) ?[name], MD as PCP - Cardiology (Cardiology) ?[name], MD (Neurosurgery) ?Penumalli, Earlean Polka, MD (Neurology) ?[name], MD (Inactive) as Consulting Physician (Gastroenterology) ?[name], MD as Consulting Physician (Urology) ?Remmie Bembenek X, NP as Nurse Practitioner (Internal Medicine) ?Ludwig Clarks, DO as Consulting Physician (Neurology) ? ?Extended Emergency Contact Information ?Primary Emergency Contact: Bordner,Patrick ?Address: [address] ?         [place], [place] [address] [place] ?Home Phone: [phone] ?Mobile Phone: [phone] ?Relation: Spouse ?Secondary Emergency Contact: [name] ?Mobile Phone: [phone] ?Relation: Daughter ?Preferred language: English ?Interpreter needed? No ? ?Code Status: DNR ?Goals of care: Advanced Directive information ? ?  03/13/2022  ?  2:41 PM  ?Advanced Directives  ?Type of Advance Directive Living will  ? ? ? ?Chief Complaint  ?Patient presents with  ?? Acute Visit  ?  C/o urinary frequency, urgency, leakage, c/o painful urination.   ? ? ?HPI:  ?Pt is a 88 y.o. female seen today for an acute visit for c/o painful urination, c/o increased urinary frequency, urgency, and leakage, denied abd pain. She is afebrile.  ?  ? ?  Dry eyes, c/o discomfort sometimes, uses artificial tears.  ?Prolapsed rectum, avoid constipation, usually reduced on its own.  ?            Hx of rena lithiasis, recurrent UTI, urosepsis, treated by ID. 09/19/21 cystoscopy, laser lithotripsy, R ureteral stent/dilatation. Occasional dysuria present.  ?            GERD, takes Pantoprazole '40mg'$  qd, Famotidine '20mg'$  qd. yearly FOBT per GI, Hgb 9.9 01/11/22 ?            Parkinson's, takes Sinemet, MiraPex f/u Dr. Carles Collet, w/c for mobility most of  time.  ?            RLS stable on MiraPex, Sinemet hs, better sleep at night.  ?            Depression/hallucination, stabilizing, takes Sertraline,  Quetiapine. Change Tramadol to prn helped. TSH 1.67 2.2.23 ?            Constipation, takes Senokot S, Amitiza, Colace, MiraLax  ?            Hypothyroidism, takes Levothyroxine 79mg qd. TSH 1.67 01/11/22 ?            Erosive arthritis, aches and pains,  takes Plaquenil, Tylenol  f/u Rheumatology.  ?            CHF/trace edema BLE, takes Furosemide, cardiology, severe aortic valve stenosis. Echocardiogram 11/15/20 EF 60-65%. Bun/creat 20/1.0 01/11/22 ?            Hyperlipidemia, LDL 64, on Rosuvastatin.  ?            Anemia, takes Fe, Hgb 9.9 01/11/22 ?            AS Cardiology palliative approach. More noticeable DOE, fatigue.  ?Past Medical History:  ?Diagnosis Date  ?? Abnormal nuclear stress test   ?? Actinic cheilitis 04/23/2019  ?? Anemia   ? iron deficiency   ?? Anxiety   ?? Aortic stenosis   ? severe by echo 2021   ?? Arthritis   ?? Broken arm   ? right   ??  Chronic diastolic CHF (congestive heart failure) ([place])   ?? Chronic pain   ?? Cognitive communication deficit   ?? Cognitive deficits   ? Communication  ?? Constipation   ?? Depression   ?? Erosive (osteo)arthritis 03/09/2020  ?? Family history of adverse reaction to anesthesia   ? pt. states sister vomits  ?? Fibromyalgia   ?? GERD (gastroesophageal reflux disease)   ?? H/O hiatal hernia   ?? Hallucinations, visual 07/21/2018  ? 03/10/20 psych consult.   ?? Hematuria 05/18/2020  ?? History of bronchitis   ?? History of kidney stones   ?? Hypercholesterolemia   ?? Hypertension   ? dr t turner  ?? Hypothyroidism   ?? Memory loss 04/21/2018  ? 08/08/18 CT head no traumatic findings, atrophy.  09/27/19 MMSE 25/30, failed the clock drawing  ?? Microhematuria 03/24/2015  ?? Neuropathy   ?? Osteoporosis   ?? Parkinson disease ([place])   ?? PVD (peripheral vascular disease) ([place])   ? 99% stenosis of left anteiror tibial  artery, mod stenosis of left distal SFA and popliteal artery followed by [name]  ?? RBBB   ? noted on EKG 2018  ?? Restless leg syndrome   ?? Sepsis ([place])   ? hx of due to [facility]  ?? Septic shock ([place]) 04/19/2020  ?? Shortness of breath   ? with exertion - chronic   ?? Sjogren's disease ([place])   ?? Sjogren's syndrome ([place]) 02/04/2018  ? 04/16/19 rheumatology: Erosive OA of hands, Sjogrens syndrome, low back pain at multiple sites, recurrent kidney stones, age related osteoporosis w/o current pathological fracture, f/u 6 months.   ?? SOB (shortness of breath)   ? chronic due to diastolic dysfunction, deconditioning, obesity  ?? Spinal stenosis   ? lumbar region   ?? Tremor   ?? Unstable gait 04/21/2018  ?? Unsteadiness on feet   ?? Urinary frequency 09/18/2018  ?? Urinary tract infection   ?? Visual hallucinations   ? ?Past Surgical History:  ?Procedure Laterality Date  ?? ABDOMINAL HYSTERECTOMY    ?? BACK SURGERY    ? 4 back surgeries,   lumbar fusion  ?? CARDIAC CATHETERIZATION  2006  ? normal  ?? CYSTOSCOPY/URETEROSCOPY/HOLMIUM LASER/STENT PLACEMENT Left 01/06/2019  ? Procedure: CYSTOSCOPY/RETROGRADE/URETEROSCOPY/HOLMIUM LASER/BASKET RETRIEVAL/STENT PLACEMENT;  Surgeon: Ceasar Mons, MD;  Location: [facility];  Service: Urology;  Laterality: Left;  ?? CYSTOSCOPY/URETEROSCOPY/HOLMIUM LASER/STENT PLACEMENT Bilateral 05/24/2020  ? Procedure: [id number]/WFUXNAT LASER/STENT PLACEMENT;  Surgeon: Ceasar Mons, MD;  Location: [facility];  Service: Urology;  Laterality: Bilateral;  ?? CYSTOSCOPY/URETEROSCOPY/HOLMIUM LASER/STENT PLACEMENT Right 09/27/2021  ? Procedure: CYSTOSCOPY/BILATERAL RETROGRADE/URETEROSCOPY/HOLMIUM LASER/STENT PLACEMENT;  Surgeon: Ceasar Mons, MD;  Location: [facility];  Service: Urology;  Laterality: Right;  ONLY NEEDS 60 MIN  ?? EYE SURGERY Bilateral   ? cateracts  ?? falls    ? variious fall, broken wrist,and toes  ?? FRACTURE SURGERY Right April  2016  ? Wrist, Pt. fell  ?? IR NEPHROSTOMY PLACEMENT RIGHT  04/19/2020  ?? kidney stone removal Left 01/20/2019  ?? RIGHT/LEFT HEART CATH AND CORONARY ANGIOGRAPHY N/A 02/05/2018  ? Procedure: RIGHT/LEFT HEART CATH AND CORONARY ANGIOGRAPHY;  Surgeon: Troy Sine, MD;  Location: [facility];  Service: Cardiovascular;  Laterality: N/A;  ?? SPINAL CORD STIMULATOR INSERTION N/A 09/09/2015  ? Procedure: LUMBAR SPINAL CORD STIMULATOR INSERTION;  Surgeon: Clydell Hakim, MD;  Location: [facility];  Service: Neurosurgery;  Laterality: N/A;  LUMBAR SPINAL CORD STIMULATOR INSERTION  ?? SPINE SURGERY  April 2013  ?  Back X's 4  ?? TOTAL HIP ARTHROPLASTY    ? right  ?? WRIST FRACTURE SURGERY Bilateral   ? ? ?Allergies  ?Allergen Reactions  ?? Adrenalone   ?? Lactose Other (See Comments)  ?  abd pain, lactose intolerant  ?? Morphine Other (See Comments)  ?? Sulfa Antibiotics Other (See Comments)  ?  Headache, very sick  ?? Codeine Other (See Comments)  ?  Reaction:  Headaches and nightmares   ?? Lactose Intolerance (Gi) Nausea And Vomiting  ?? Latex Rash  ?? Lyrica [Pregabalin] Swelling and Other (See Comments)  ?  Reaction:  Leg swelling  ?? Other Other (See Comments)  ?  Pt states that pain medications give her nightmares.    ?? Plaquenil [Hydroxychloroquine Sulfate] Other (See Comments)  ?  Reaction:  GI upset   ?? Reglan [Metoclopramide] Other (See Comments)  ?  Reaction:  GI upset   ?? Requip [Ropinirole Hcl] Other (See Comments)  ?  Reaction:  GI upset   ?? Septra [Sulfamethoxazole-Trimethoprim] Nausea And Vomiting  ?? Shellfish Allergy Nausea And Vomiting  ? ? ?Allergies as of 03/15/2022   ? ?   Reactions  ? Adrenalone   ? Lactose Other (See Comments)  ? abd pain, lactose intolerant  ? Morphine Other (See Comments)  ? Sulfa Antibiotics Other (See Comments)  ? Headache, very sick  ? Codeine Other (See Comments)  ? Reaction:  Headaches and nightmares   ? Lactose Intolerance (gi) Nausea And Vomiting  ? Latex Rash  ?  Lyrica [pregabalin] Swelling, Other (See Comments)  ? Reaction:  Leg swelling  ? Other Other (See Comments)  ? Pt states that pain medications give her nightmares.    ? Plaquenil [hydroxychloroquine Sulfate] Other

## 2022-03-19 ENCOUNTER — Encounter: Payer: Self-pay | Admitting: Nurse Practitioner

## 2022-03-19 DIAGNOSIS — K219 Gastro-esophageal reflux disease without esophagitis: Secondary | ICD-10-CM | POA: Diagnosis not present

## 2022-03-19 DIAGNOSIS — R498 Other voice and resonance disorders: Secondary | ICD-10-CM | POA: Diagnosis not present

## 2022-03-19 DIAGNOSIS — R0602 Shortness of breath: Secondary | ICD-10-CM | POA: Diagnosis not present

## 2022-03-19 DIAGNOSIS — G20 Parkinson's disease: Secondary | ICD-10-CM | POA: Diagnosis not present

## 2022-03-19 NOTE — Assessment & Plan Note (Signed)
c/o discomfort sometimes, uses artificial tears.  ?

## 2022-03-19 NOTE — Assessment & Plan Note (Signed)
takes Levothyroxine 73mg qd. TSH 1.67 01/11/22 ?

## 2022-03-19 NOTE — Assessment & Plan Note (Signed)
stable on MiraPex, Sinemet hs, better sleep at night.  

## 2022-03-19 NOTE — Assessment & Plan Note (Signed)
takes Pantoprazole '40mg'$  qd, Famotidine '20mg'$  qd. yearly FOBT per GI, Hgb 9.9 01/11/22 ?

## 2022-03-19 NOTE — Assessment & Plan Note (Signed)
takes Fe, Hgb 9.9 01/11/22 ?

## 2022-03-19 NOTE — Assessment & Plan Note (Signed)
takes Sinemet, MiraPex f/u Dr. Tat, w/c for mobility most of time.  

## 2022-03-19 NOTE — Assessment & Plan Note (Signed)
Stable,  takes Senokot S, Amitiza, Colace, MiraLax  

## 2022-03-19 NOTE — Assessment & Plan Note (Signed)
Cardiology palliative approach. More noticeable DOE, fatigue.  ?

## 2022-03-19 NOTE — Assessment & Plan Note (Signed)
LDL 64, on Rosuvastatin.  

## 2022-03-19 NOTE — Assessment & Plan Note (Signed)
Hx of rena lithiasis, recurrent UTI, urosepsis, treated by ID. 09/19/21 cystoscopy, laser lithotripsy, R ureteral stent/dilatation. Occasional dysuria present.  

## 2022-03-19 NOTE — Assessment & Plan Note (Signed)
aches and pains,  takes Plaquenil, Tylenol  f/u Rheumatology.  

## 2022-03-19 NOTE — Assessment & Plan Note (Signed)
stabilizing, takes Sertraline,  Quetiapine. Change Tramadol to prn helped. TSH 1.67 2.2.23 ?

## 2022-03-19 NOTE — Assessment & Plan Note (Signed)
takes Furosemide, cardiology, severe aortic valve stenosis. Echocardiogram 11/15/20 EF 60-65%. Bun/creat 20/1.0 01/11/22 ?

## 2022-03-19 NOTE — Assessment & Plan Note (Signed)
avoid constipation, usually reduced on its own.  

## 2022-03-20 DIAGNOSIS — R0602 Shortness of breath: Secondary | ICD-10-CM | POA: Diagnosis not present

## 2022-03-20 DIAGNOSIS — R498 Other voice and resonance disorders: Secondary | ICD-10-CM | POA: Diagnosis not present

## 2022-03-20 DIAGNOSIS — K219 Gastro-esophageal reflux disease without esophagitis: Secondary | ICD-10-CM | POA: Diagnosis not present

## 2022-03-20 DIAGNOSIS — G20 Parkinson's disease: Secondary | ICD-10-CM | POA: Diagnosis not present

## 2022-03-22 DIAGNOSIS — R498 Other voice and resonance disorders: Secondary | ICD-10-CM | POA: Diagnosis not present

## 2022-03-22 DIAGNOSIS — G20 Parkinson's disease: Secondary | ICD-10-CM | POA: Diagnosis not present

## 2022-03-22 DIAGNOSIS — K219 Gastro-esophageal reflux disease without esophagitis: Secondary | ICD-10-CM | POA: Diagnosis not present

## 2022-03-22 DIAGNOSIS — R0602 Shortness of breath: Secondary | ICD-10-CM | POA: Diagnosis not present

## 2022-03-26 DIAGNOSIS — K219 Gastro-esophageal reflux disease without esophagitis: Secondary | ICD-10-CM | POA: Diagnosis not present

## 2022-03-26 DIAGNOSIS — R0602 Shortness of breath: Secondary | ICD-10-CM | POA: Diagnosis not present

## 2022-03-26 DIAGNOSIS — G20 Parkinson's disease: Secondary | ICD-10-CM | POA: Diagnosis not present

## 2022-03-26 DIAGNOSIS — R498 Other voice and resonance disorders: Secondary | ICD-10-CM | POA: Diagnosis not present

## 2022-03-27 DIAGNOSIS — K219 Gastro-esophageal reflux disease without esophagitis: Secondary | ICD-10-CM | POA: Diagnosis not present

## 2022-03-27 DIAGNOSIS — Z85068 Personal history of other malignant neoplasm of small intestine: Secondary | ICD-10-CM | POA: Diagnosis not present

## 2022-03-27 DIAGNOSIS — L814 Other melanin hyperpigmentation: Secondary | ICD-10-CM | POA: Diagnosis not present

## 2022-03-27 DIAGNOSIS — L988 Other specified disorders of the skin and subcutaneous tissue: Secondary | ICD-10-CM | POA: Diagnosis not present

## 2022-03-27 DIAGNOSIS — R498 Other voice and resonance disorders: Secondary | ICD-10-CM | POA: Diagnosis not present

## 2022-03-27 DIAGNOSIS — G20 Parkinson's disease: Secondary | ICD-10-CM | POA: Diagnosis not present

## 2022-03-27 DIAGNOSIS — R0602 Shortness of breath: Secondary | ICD-10-CM | POA: Diagnosis not present

## 2022-03-27 DIAGNOSIS — L570 Actinic keratosis: Secondary | ICD-10-CM | POA: Diagnosis not present

## 2022-03-29 ENCOUNTER — Non-Acute Institutional Stay (SKILLED_NURSING_FACILITY): Payer: Medicare Other | Admitting: Nurse Practitioner

## 2022-03-29 DIAGNOSIS — R0602 Shortness of breath: Secondary | ICD-10-CM | POA: Diagnosis not present

## 2022-03-29 DIAGNOSIS — F323 Major depressive disorder, single episode, severe with psychotic features: Secondary | ICD-10-CM

## 2022-03-29 DIAGNOSIS — N390 Urinary tract infection, site not specified: Secondary | ICD-10-CM | POA: Diagnosis not present

## 2022-03-29 DIAGNOSIS — I350 Nonrheumatic aortic (valve) stenosis: Secondary | ICD-10-CM | POA: Diagnosis not present

## 2022-03-29 DIAGNOSIS — M154 Erosive (osteo)arthritis: Secondary | ICD-10-CM | POA: Diagnosis not present

## 2022-03-29 DIAGNOSIS — E7800 Pure hypercholesterolemia, unspecified: Secondary | ICD-10-CM

## 2022-03-29 DIAGNOSIS — K5909 Other constipation: Secondary | ICD-10-CM | POA: Diagnosis not present

## 2022-03-29 DIAGNOSIS — I5032 Chronic diastolic (congestive) heart failure: Secondary | ICD-10-CM

## 2022-03-29 DIAGNOSIS — R498 Other voice and resonance disorders: Secondary | ICD-10-CM | POA: Diagnosis not present

## 2022-03-29 DIAGNOSIS — D508 Other iron deficiency anemias: Secondary | ICD-10-CM | POA: Diagnosis not present

## 2022-03-29 DIAGNOSIS — K219 Gastro-esophageal reflux disease without esophagitis: Secondary | ICD-10-CM | POA: Diagnosis not present

## 2022-03-29 DIAGNOSIS — E039 Hypothyroidism, unspecified: Secondary | ICD-10-CM | POA: Diagnosis not present

## 2022-03-29 DIAGNOSIS — G20 Parkinson's disease: Secondary | ICD-10-CM | POA: Diagnosis not present

## 2022-03-30 ENCOUNTER — Encounter: Payer: Self-pay | Admitting: Nurse Practitioner

## 2022-03-30 DIAGNOSIS — N390 Urinary tract infection, site not specified: Secondary | ICD-10-CM | POA: Diagnosis not present

## 2022-03-30 DIAGNOSIS — D649 Anemia, unspecified: Secondary | ICD-10-CM | POA: Diagnosis not present

## 2022-03-30 DIAGNOSIS — I10 Essential (primary) hypertension: Secondary | ICD-10-CM | POA: Diagnosis not present

## 2022-03-30 NOTE — Progress Notes (Signed)
?Location:  [facility] ?Nursing Home Room Number: No45/A ?Place of Service:  SNF (31) ?Provider:  Elenora Fender, Padilla ? ?[name] ? ?Patient Care Team: ?[name] as PCP - General (Internal Medicine) ?[name] as PCP - Cardiology (Cardiology) ?[name] (Neurosurgery) ?[name] (Neurology) ?[name] (Inactive) as Consulting Physician (Gastroenterology) ?[name] as Consulting Physician (Urology) ?[name] as Nurse Practitioner (Internal Medicine) ?[name] as Consulting Physician (Neurology) ? ?Extended Emergency Contact Information ?Primary Emergency Contact: [name] ?Address: [address] ?         [place], [place] [address] [place] ?Home Phone: [phone] ?Mobile Phone: [phone] ?Relation: Spouse ?Secondary Emergency Contact: [name] ?Mobile Phone: [phone] ?Relation: Daughter ?Preferred language: English ?Interpreter needed? No ? ?Code Status:  DNR ?Goals of care: Advanced Directive information ? ?  03/30/2022  ? 11:27 AM  ?Advanced Directives  ?Does Patient Have a Medical Advance Directive? Yes  ?Type of Advance Directive Living will;Out of facility DNR (pink MOST or yellow form)  ?Does patient want to make changes to medical advance directive? No - Patient declined  ?Copy of [place] in Chart? Yes - validated most recent copy scanned in chart (See row information)  ?Pre-existing out of facility DNR order (yellow form or pink MOST form) Yellow form placed in chart (order not valid for inpatient use);Pink MOST form placed in chart (order not valid for inpatient use)  ? ? ? ?Chief Complaint  ?Patient presents with  ? Acute Visit  ?  Patient was seen for hallucinations  ? ? ?HPI:  ?Pt is a 88 y.o. female seen today for an acute visit for reported increased hallucination 03/28/22. 03/30/22 urine culture showed E Coli >100,000c/ml susceptible to Cipro.  The patient has been on and off c/o burning/painful urination. Last urine culture 03/15/22 only E Coli 10,000-49,000c/ml. She is afebrile, denied blood in urine.  ?  ?  Dry eyes, c/o discomfort sometimes, uses artificial tears.  ?Prolapsed rectum, avoid constipation, usually reduced on its own.  ?            Hx of rena lithiasis, recurrent UTI, urosepsis, treated by ID. 09/19/21 cystoscopy, laser lithotripsy, R ureteral stent/dilatation. Occasional dysuria present.  ?            GERD, takes Pantoprazole '40mg'$  qd, Famotidine '20mg'$  qd. yearly FOBT per GI, Hgb 9.9 01/11/22 ?            Parkinson's, takes Sinemet, MiraPex f/u [name], w/c for mobility most of time.  ?            RLS stable on MiraPex, Sinemet hs, better sleep at night.  ?            Depression/hallucination, takes Sertraline,  Quetiapine. Change Tramadol to prn helped. TSH 1.67 2.2.23 ?            Constipation, takes Senokot S, Amitiza, Colace, MiraLax  ?            Hypothyroidism, takes Levothyroxine 33mg qd. TSH 1.67 01/11/22 ?            Erosive arthritis, aches and pains,  takes Plaquenil, Tylenol  f/u Rheumatology.  ?            CHF/trace edema BLE, takes Furosemide, cardiology, severe aortic valve stenosis. Echocardiogram 11/15/20 EF 60-65%. Bun/creat 20/0.95 03/30/22 ?  Hyperlipidemia, LDL 64, on Rosuvastatin.  ?            Anemia, takes Fe, Hgb 9.6 03/30/22 ?            AS Cardiology palliative approach. More noticeable DOE, fatigue.  ? ? ?Past Medical History:  ?Diagnosis Date  ? Abnormal nuclear stress test   ? Actinic cheilitis 04/23/2019  ? Anemia   ? iron deficiency   ? Anxiety   ? Aortic stenosis   ? severe by echo 2021   ? Arthritis   ? Broken arm   ? right   ? Chronic diastolic CHF (congestive heart failure) ([place])   ? Chronic pain   ? Cognitive communication deficit   ? Cognitive deficits   ? Communication  ? Constipation   ? Depression   ? Erosive (osteo)arthritis 03/09/2020  ? Family history of adverse reaction to anesthesia   ? pt.  states sister vomits  ? Fibromyalgia   ? GERD (gastroesophageal reflux disease)   ? H/O hiatal hernia   ? Hallucinations, visual 07/21/2018  ? 03/10/20 psych consult.   ? Hematuria 05/18/2020  ? History of bronchitis   ? History of kidney stones   ? Hypercholesterolemia   ? Hypertension   ? dr t turner  ? Hypothyroidism   ? Memory loss 04/21/2018  ? 08/08/18 CT head no traumatic findings, atrophy.  09/27/19 MMSE 25/30, failed the clock drawing  ? Microhematuria 03/24/2015  ? Neuropathy   ? Osteoporosis   ? Parkinson disease ([place])   ? PVD (peripheral vascular disease) ([place])   ? 99% stenosis of left anteiror tibial artery, mod stenosis of left distal SFA and popliteal artery followed by [name]  ? RBBB   ? noted on EKG 2018  ? Restless leg syndrome   ? Sepsis ([place])   ? hx of due to [facility]  ? Septic shock ([place]) 04/19/2020  ? Shortness of breath   ? with exertion - chronic   ? Sjogren's disease ([place])   ? Sjogren's syndrome ([place]) 02/04/2018  ? 04/16/19 rheumatology: Erosive OA of hands, Sjogrens syndrome, low back pain at multiple sites, recurrent kidney stones, age related osteoporosis w/o current pathological fracture, f/u 6 months.   ? SOB (shortness of breath)   ? chronic due to diastolic dysfunction, deconditioning, obesity  ? Spinal stenosis   ? lumbar region   ? Tremor   ? Unstable gait 04/21/2018  ? Unsteadiness on feet   ? Urinary frequency 09/18/2018  ? Urinary tract infection   ? Visual hallucinations   ? ?Past Surgical History:  ?Procedure Laterality Date  ? ABDOMINAL HYSTERECTOMY    ? BACK SURGERY    ? 4 back surgeries,   lumbar fusion  ? CARDIAC CATHETERIZATION  2006  ? normal  ? CYSTOSCOPY/URETEROSCOPY/HOLMIUM LASER/STENT PLACEMENT Left 01/06/2019  ? Procedure: CYSTOSCOPY/RETROGRADE/URETEROSCOPY/HOLMIUM LASER/BASKET RETRIEVAL/STENT PLACEMENT;  Surgeon: Ceasar Mons, Padilla;  Location: [facility];  Service: Urology;  Laterality: Left;  ? CYSTOSCOPY/URETEROSCOPY/HOLMIUM LASER/STENT PLACEMENT Bilateral  05/24/2020  ? Procedure: [id number]/[id number]/PQZRAQT LASER/STENT PLACEMENT;  Surgeon: Ceasar Mons, Padilla;  Location: [facility];  Service: Urology;  Laterality: Bilateral;  ? CYSTOSCOPY/URETEROSCOPY/HOLMIUM LASER/STENT PLACEMENT Right 09/27/2021  ? Procedure: CYSTOSCOPY/BILATERAL RETROGRADE/URETEROSCOPY/HOLMIUM LASER/STENT PLACEMENT;  Surgeon: Ceasar Mons, Padilla;  Location: [facility];  Service: Urology;  Laterality: Right;  ONLY NEEDS 60 MIN  ? EYE SURGERY Bilateral   ? cateracts  ? falls    ? variious fall, broken wrist,and toes  ? FRACTURE SURGERY Right  April 2016  ? Wrist, Pt. fell  ? IR NEPHROSTOMY PLACEMENT RIGHT  04/19/2020  ? kidney stone removal Left 01/20/2019  ? RIGHT/LEFT HEART CATH AND CORONARY ANGIOGRAPHY N/A 02/05/2018  ? Procedure: RIGHT/LEFT HEART CATH AND CORONARY ANGIOGRAPHY;  Surgeon: Troy Sine, Padilla;  Location: [place] CV LAB;  Service: Cardiovascular;  Laterality: N/A;  ? SPINAL CORD STIMULATOR INSERTION N/A 09/09/2015  ? Procedure: LUMBAR SPINAL CORD STIMULATOR INSERTION;  Surgeon: Clydell Hakim, Padilla;  Location: [place] NEURO ORS;  Service: Neurosurgery;  Laterality: N/A;  LUMBAR SPINAL CORD STIMULATOR INSERTION  ? SPINE SURGERY  April 2013  ? Back X's 4  ? TOTAL HIP ARTHROPLASTY    ? right  ? WRIST FRACTURE SURGERY Bilateral   ? ? ?Allergies  ?Allergen Reactions  ? Adrenalone   ? Lactose Other (See Comments)  ?  abd pain, lactose intolerant  ? Morphine Other (See Comments)  ? Sulfa Antibiotics Other (See Comments)  ?  Headache, very sick  ? Codeine Other (See Comments)  ?  Reaction:  Headaches and nightmares   ? Lactose Intolerance (Gi) Nausea And Vomiting  ? Latex Rash  ? Lyrica [Pregabalin] Swelling and Other (See Comments)  ?  Reaction:  Leg swelling  ? Other Other (See Comments)  ?  Pt states that pain medications give her nightmares.    ? Plaquenil [Hydroxychloroquine Sulfate] Other (See Comments)  ?  Reaction:  GI upset   ? Reglan [Metoclopramide] Other (See  Comments)  ?  Reaction:  GI upset   ? Requip [Ropinirole Hcl] Other (See Comments)  ?  Reaction:  GI upset   ? Septra [Sulfamethoxazole-Trimethoprim] Nausea And Vomiting  ? Shellfish Allergy Nausea And Vomiti

## 2022-03-30 NOTE — Progress Notes (Signed)
This encounter was created in error - please disregard.

## 2022-03-30 NOTE — Progress Notes (Deleted)
?Location:  [facility] ?Nursing Home Room Number: NO/45/A ?Place of Service:  SNF (31) ?Provider:  [name], Man, X, NP ? ?[name], MD ? ?Patient Care Team: ?[name], MD as PCP - General (Internal Medicine) ?[name], MD as PCP - Cardiology (Cardiology) ?[name], MD (Neurosurgery) ?Penumalli, Earlean Polka, MD (Neurology) ?[name], MD (Inactive) as Consulting Physician (Gastroenterology) ?[name], MD as Consulting Physician (Urology) ?[name], Man X, NP as Nurse Practitioner (Internal Medicine) ?Ludwig Clarks, DO as Consulting Physician (Neurology) ? ?Extended Emergency Contact Information ?Primary Emergency Contact: Code,Patrick ?Address: [address] ?         [place], [place] [address] [place] ?Home Phone: [phone] ?Mobile Phone: [phone] ?Relation: Spouse ?Secondary Emergency Contact: [name] ?Mobile Phone: [phone] ?Relation: Daughter ?Preferred language: English ?Interpreter needed? No ? ?Code Status:  DNR ?Goals of care: Advanced Directive information ? ?  03/13/2022  ?  2:41 PM  ?Advanced Directives  ?Type of Advance Directive Living will  ? ? ? ?Chief Complaint  ?Patient presents with  ? Acute Visit  ?  Patient is being seen for hallucinations  ? ? ?HPI:  ?Pt is a 88 y.o. female seen today for an acute visit for  ? ? ?Past Medical History:  ?Diagnosis Date  ? Abnormal nuclear stress test   ? Actinic cheilitis 04/23/2019  ? Anemia   ? iron deficiency   ? Anxiety   ? Aortic stenosis   ? severe by echo 2021   ? Arthritis   ? Broken arm   ? right   ? Chronic diastolic CHF (congestive heart failure) ([place])   ? Chronic pain   ? Cognitive communication deficit   ? Cognitive deficits   ? Communication  ? Constipation   ? Depression   ? Erosive (osteo)arthritis 03/09/2020  ? Family history of adverse reaction to anesthesia   ? pt. states sister vomits  ? Fibromyalgia   ? GERD (gastroesophageal reflux disease)   ? H/O hiatal  hernia   ? Hallucinations, visual 07/21/2018  ? 03/10/20 psych consult.   ? Hematuria 05/18/2020  ? History of bronchitis   ? History of kidney stones   ? Hypercholesterolemia   ? Hypertension   ? dr t turner  ? Hypothyroidism   ? Memory loss 04/21/2018  ? 08/08/18 CT head no traumatic findings, atrophy.  09/27/19 MMSE 25/30, failed the clock drawing  ? Microhematuria 03/24/2015  ? Neuropathy   ? Osteoporosis   ? Parkinson disease ([place])   ? PVD (peripheral vascular disease) ([place])   ? 99% stenosis of left anteiror tibial artery, mod stenosis of left distal SFA and popliteal artery followed by [name]  ? RBBB   ? noted on EKG 2018  ? Restless leg syndrome   ? Sepsis ([place])   ? hx of due to [facility]  ? Septic shock ([place]) 04/19/2020  ? Shortness of breath   ? with exertion - chronic   ? Sjogren's disease ([place])   ? Sjogren's syndrome ([place]) 02/04/2018  ? 04/16/19 rheumatology: Erosive OA of hands, Sjogrens syndrome, low back pain at multiple sites, recurrent kidney stones, age related osteoporosis w/o current pathological fracture, f/u 6 months.   ? SOB (shortness of breath)   ? chronic due to diastolic dysfunction, deconditioning, obesity  ? Spinal stenosis   ? lumbar region   ? Tremor   ? Unstable gait 04/21/2018  ? Unsteadiness on feet   ? Urinary  frequency 09/18/2018  ? Urinary tract infection   ? Visual hallucinations   ? ?Past Surgical History:  ?Procedure Laterality Date  ? ABDOMINAL HYSTERECTOMY    ? BACK SURGERY    ? 4 back surgeries,   lumbar fusion  ? CARDIAC CATHETERIZATION  2006  ? normal  ? CYSTOSCOPY/URETEROSCOPY/HOLMIUM LASER/STENT PLACEMENT Left 01/06/2019  ? Procedure: CYSTOSCOPY/RETROGRADE/URETEROSCOPY/HOLMIUM LASER/BASKET RETRIEVAL/STENT PLACEMENT;  Surgeon: Ceasar Mons, MD;  [facility];  Service: Urology;  Laterality: Left;  ? CYSTOSCOPY/URETEROSCOPY/HOLMIUM LASER/STENT PLACEMENT Bilateral 05/24/2020  ? Procedure: [id number]/[id number] LASER/STENT PLACEMENT;  Surgeon:  Ceasar Mons, MD;  [facility];  Service: Urology;  Laterality: Bilateral;  ? CYSTOSCOPY/URETEROSCOPY/HOLMIUM LASER/STENT PLACEMENT Right 09/27/2021  ? Procedure: CYSTOSCOPY/BILATERAL RETROGRADE/URETEROSCOPY/HOLMIUM LASER/STENT PLACEMENT;  Surgeon: Ceasar Mons, MD;  [facility];  Service: Urology;  Laterality: Right;  ONLY NEEDS 60 MIN  ? EYE SURGERY Bilateral   ? cateracts  ? falls    ? variious fall, broken wrist,and toes  ? FRACTURE SURGERY Right April 2016  ? Wrist, Pt. fell  ? IR NEPHROSTOMY PLACEMENT RIGHT  04/19/2020  ? kidney stone removal Left 01/20/2019  ? RIGHT/LEFT HEART CATH AND CORONARY ANGIOGRAPHY N/A 02/05/2018  ? Procedure: RIGHT/LEFT HEART CATH AND CORONARY ANGIOGRAPHY;  Surgeon: Troy Sine, MD;  Location: [place] [facility];  Service: Cardiovascular;  Laterality: N/A;  ? SPINAL CORD STIMULATOR INSERTION N/A 09/09/2015  ? Procedure: LUMBAR SPINAL CORD STIMULATOR INSERTION;  Surgeon: Clydell Hakim, MD;  Location: [place] [facility];  Service: Neurosurgery;  Laterality: N/A;  LUMBAR SPINAL CORD STIMULATOR INSERTION  ? SPINE SURGERY  April 2013  ? Back X's 4  ? TOTAL HIP ARTHROPLASTY    ? right  ? WRIST FRACTURE SURGERY Bilateral   ? ? ?Allergies  ?Allergen Reactions  ? Adrenalone   ? Lactose Other (See Comments)  ?  abd pain, lactose intolerant  ? Morphine Other (See Comments)  ? Sulfa Antibiotics Other (See Comments)  ?  Headache, very sick  ? Codeine Other (See Comments)  ?  Reaction:  Headaches and nightmares   ? Lactose Intolerance (Gi) Nausea And Vomiting  ? Latex Rash  ? Lyrica [Pregabalin] Swelling and Other (See Comments)  ?  Reaction:  Leg swelling  ? Other Other (See Comments)  ?  Pt states that pain medications give her nightmares.    ? Plaquenil [Hydroxychloroquine Sulfate] Other (See Comments)  ?  Reaction:  GI upset   ? Reglan [Metoclopramide] Other (See Comments)  ?  Reaction:  GI upset   ? Requip [Ropinirole Hcl] Other (See Comments)  ?  Reaction:  GI  upset   ? Septra [Sulfamethoxazole-Trimethoprim] Nausea And Vomiting  ? Shellfish Allergy Nausea And Vomiting  ? ? ?Outpatient Encounter Medications as of 03/30/2022  ?Medication Sig  ? acetaminophen (TYLENOL) 500 MG tablet Take 500 mg by mouth See admin instructions. Take 1 tablet (500 mg) by mouth scheduled 3 times daily scheduled & 3 times daily as needed for pain (Not to exceed 3g/24 hrs)  ? aspirin 81 MG chewable tablet Chew 81 mg by mouth daily.  ? Calcium Carb-Cholecalciferol (CALCIUM 600/VITAMIN D3) 600-800 MG-UNIT TABS Take 1 tablet by mouth daily.  ? carbidopa-levodopa (SINEMET IR) 25-100 MG tablet Take 1 tablet by mouth 3 (three) times daily.  ? Cholecalciferol (VITAMIN D) 50 MCG (2000 UT) tablet Take 2,000 Units by mouth daily.  ? docusate sodium (COLACE) 100 MG capsule Take 100 mg by mouth at bedtime.  ? famotidine (PEPCID) 20  MG tablet Take 20 mg by mouth daily.  ? Ferrous Sulfate (SLOW FE) 142 (45 Fe) MG TBCR Take 142 mg by mouth in the morning.  ? furosemide (LASIX) 20 MG tablet Take 1 tablet (20 mg total) by mouth daily.  ? furosemide (LASIX) 20 MG tablet Take 20 mg by mouth. Give '20mg'$  PO QD PRN for lower extremity swelling.  ? hydroxychloroquine (PLAQUENIL) 200 MG tablet Take 200 mg by mouth daily.  ? lactose free nutrition (BOOST) LIQD Take by mouth 2 (two) times daily. 1/2 box; Resident likes chocolate. ?Twice A Day  ? levothyroxine (SYNTHROID, LEVOTHROID) 50 MCG tablet Take 50 mcg by mouth daily before breakfast.   ? loperamide (IMODIUM A-D) 2 MG tablet Take 2 mg by mouth every 6 (six) hours as needed for diarrhea or loose stools.  ? lubiprostone (AMITIZA) 24 MCG capsule Take 24 mcg by mouth 2 (two) times daily with a meal.   ? Multiple Vitamin (MULTIVITAMIN WITH MINERALS) TABS tablet Take 1 tablet by mouth daily.  ? Multiple Vitamins-Minerals (PRESERVISION AREDS 2 PO) Take 1 tablet by mouth daily.  ? NYAMYC powder Apply topically.  ? pantoprazole (PROTONIX) 40 MG tablet Take 40 mg by mouth  daily.  ? Polyethyl Glycol-Propyl Glycol (SYSTANE) 0.4-0.3 % SOLN Apply 1 drop to eye 2 (two) times daily as needed (dry eyes).   ? polyethylene glycol (MIRALAX / GLYCOLAX) 17 g packet Take 17 g by mouth daily.

## 2022-03-31 LAB — CBC AND DIFFERENTIAL
HCT: 30 — AB (ref 36–46)
Hemoglobin: 9.6 — AB (ref 12.0–16.0)
Neutrophils Absolute: 5342
Platelets: 248 10*3/uL (ref 150–400)
WBC: 8.8

## 2022-03-31 LAB — BASIC METABOLIC PANEL
BUN: 20 (ref 4–21)
CO2: 23 — AB (ref 13–22)
Chloride: 108 (ref 99–108)
Creatinine: 1 (ref 0.5–1.1)
Glucose: 91
Potassium: 4.6 mEq/L (ref 3.5–5.1)
Sodium: 140 (ref 137–147)

## 2022-03-31 LAB — COMPREHENSIVE METABOLIC PANEL
Albumin: 3.6 (ref 3.5–5.0)
Calcium: 9.4 (ref 8.7–10.7)
Globulin: 2.2

## 2022-03-31 LAB — HEPATIC FUNCTION PANEL
ALT: 12 U/L (ref 7–35)
AST: 18 (ref 13–35)
Alkaline Phosphatase: 76 (ref 25–125)
Bilirubin, Total: 0.2

## 2022-03-31 LAB — CBC: RBC: 3.06 — AB (ref 3.87–5.11)

## 2022-04-02 ENCOUNTER — Encounter: Payer: Self-pay | Admitting: Nurse Practitioner

## 2022-04-02 DIAGNOSIS — H353212 Exudative age-related macular degeneration, right eye, with inactive choroidal neovascularization: Secondary | ICD-10-CM | POA: Diagnosis not present

## 2022-04-02 DIAGNOSIS — K219 Gastro-esophageal reflux disease without esophagitis: Secondary | ICD-10-CM | POA: Diagnosis not present

## 2022-04-02 DIAGNOSIS — G20 Parkinson's disease: Secondary | ICD-10-CM | POA: Diagnosis not present

## 2022-04-02 DIAGNOSIS — R0602 Shortness of breath: Secondary | ICD-10-CM | POA: Diagnosis not present

## 2022-04-02 DIAGNOSIS — R498 Other voice and resonance disorders: Secondary | ICD-10-CM | POA: Diagnosis not present

## 2022-04-02 NOTE — Assessment & Plan Note (Signed)
takes Levothyroxine 68mg qd. TSH 1.67 01/11/22 ?

## 2022-04-02 NOTE — Assessment & Plan Note (Signed)
Will treat UTI, observe for hallucination,  takes Sertraline,  Quetiapine. Change Tramadol to prn helped. TSH 1.67 2.2.23 ?

## 2022-04-02 NOTE — Assessment & Plan Note (Signed)
takes Fe, Hgb 9.6 03/30/22 

## 2022-04-02 NOTE — Assessment & Plan Note (Signed)
03/30/22 urine culture E Coli, Cipro 500g bid x 10 days. Wbc 8.8, Hgb 9.6, plt 248, neutrophils 60.7, Na 140, K 4.6, Bun 20, creat 0.95, eGFR 58 ? ?

## 2022-04-02 NOTE — Assessment & Plan Note (Addendum)
Stable,  takes Senokot S, Amitiza, Colace, MiraLax  

## 2022-04-02 NOTE — Assessment & Plan Note (Signed)
aches and pains,  takes Plaquenil, Tylenol  f/u Rheumatology.  

## 2022-04-02 NOTE — Assessment & Plan Note (Signed)
LDL 64, on Rosuvastatin.  

## 2022-04-02 NOTE — Assessment & Plan Note (Signed)
Cardiology palliative approach. More noticeable DOE, fatigue.  ?

## 2022-04-02 NOTE — Assessment & Plan Note (Signed)
trace edema BLE, takes Furosemide, cardiology, severe aortic valve stenosis. Echocardiogram 11/15/20 EF 60-65%. Bun/creat 20/0.95 03/30/22 

## 2022-04-06 DIAGNOSIS — R0602 Shortness of breath: Secondary | ICD-10-CM | POA: Diagnosis not present

## 2022-04-06 DIAGNOSIS — K219 Gastro-esophageal reflux disease without esophagitis: Secondary | ICD-10-CM | POA: Diagnosis not present

## 2022-04-06 DIAGNOSIS — R498 Other voice and resonance disorders: Secondary | ICD-10-CM | POA: Diagnosis not present

## 2022-04-06 DIAGNOSIS — G20 Parkinson's disease: Secondary | ICD-10-CM | POA: Diagnosis not present

## 2022-04-08 DIAGNOSIS — R498 Other voice and resonance disorders: Secondary | ICD-10-CM | POA: Diagnosis not present

## 2022-04-08 DIAGNOSIS — G20 Parkinson's disease: Secondary | ICD-10-CM | POA: Diagnosis not present

## 2022-04-08 DIAGNOSIS — R0602 Shortness of breath: Secondary | ICD-10-CM | POA: Diagnosis not present

## 2022-04-08 DIAGNOSIS — K219 Gastro-esophageal reflux disease without esophagitis: Secondary | ICD-10-CM | POA: Diagnosis not present

## 2022-04-09 DIAGNOSIS — R498 Other voice and resonance disorders: Secondary | ICD-10-CM | POA: Diagnosis not present

## 2022-04-09 DIAGNOSIS — K219 Gastro-esophageal reflux disease without esophagitis: Secondary | ICD-10-CM | POA: Diagnosis not present

## 2022-04-09 DIAGNOSIS — G20 Parkinson's disease: Secondary | ICD-10-CM | POA: Diagnosis not present

## 2022-04-09 DIAGNOSIS — R0602 Shortness of breath: Secondary | ICD-10-CM | POA: Diagnosis not present

## 2022-04-11 DIAGNOSIS — K219 Gastro-esophageal reflux disease without esophagitis: Secondary | ICD-10-CM | POA: Diagnosis not present

## 2022-04-11 DIAGNOSIS — G20 Parkinson's disease: Secondary | ICD-10-CM | POA: Diagnosis not present

## 2022-04-11 DIAGNOSIS — R498 Other voice and resonance disorders: Secondary | ICD-10-CM | POA: Diagnosis not present

## 2022-04-11 DIAGNOSIS — R0602 Shortness of breath: Secondary | ICD-10-CM | POA: Diagnosis not present

## 2022-04-13 DIAGNOSIS — K219 Gastro-esophageal reflux disease without esophagitis: Secondary | ICD-10-CM | POA: Diagnosis not present

## 2022-04-13 DIAGNOSIS — R0602 Shortness of breath: Secondary | ICD-10-CM | POA: Diagnosis not present

## 2022-04-13 DIAGNOSIS — G20 Parkinson's disease: Secondary | ICD-10-CM | POA: Diagnosis not present

## 2022-04-13 DIAGNOSIS — R498 Other voice and resonance disorders: Secondary | ICD-10-CM | POA: Diagnosis not present

## 2022-04-16 DIAGNOSIS — G20 Parkinson's disease: Secondary | ICD-10-CM | POA: Diagnosis not present

## 2022-04-16 DIAGNOSIS — R498 Other voice and resonance disorders: Secondary | ICD-10-CM | POA: Diagnosis not present

## 2022-04-16 DIAGNOSIS — K219 Gastro-esophageal reflux disease without esophagitis: Secondary | ICD-10-CM | POA: Diagnosis not present

## 2022-04-16 DIAGNOSIS — R0602 Shortness of breath: Secondary | ICD-10-CM | POA: Diagnosis not present

## 2022-04-17 ENCOUNTER — Encounter: Payer: Self-pay | Admitting: Internal Medicine

## 2022-04-17 NOTE — Progress Notes (Deleted)
?Location:   [facility] ?Nursing Home Room Number: 45 ?Place of Service:  SNF (31) ?Provider:  [name] MD ? ?[name], MD ? ?Patient Care Team: ?[name], MD as PCP - General (Internal Medicine) ?[name], MD as PCP - Cardiology (Cardiology) ?[name], MD (Neurosurgery) ?Penumalli, Earlean Polka, MD (Neurology) ?[name], MD (Inactive) as Consulting Physician (Gastroenterology) ?[name], MD as Consulting Physician (Urology) ?[name], Man X, NP as Nurse Practitioner (Internal Medicine) ?Ludwig Clarks, DO as Consulting Physician (Neurology) ? ?Extended Emergency Contact Information ?Primary Emergency Contact: Radcliffe,Patrick ?Address: [address] ?         [place], [place] [address] [place] ?Home Phone: [phone] ?Mobile Phone: [phone] ?Relation: Spouse ?Secondary Emergency Contact: [name] ?Mobile Phone: [phone] ?Relation: Daughter ?Preferred language: English ?Interpreter needed? No ? ?Code Status:  Full Code ?Goals of care: Advanced Directive information ? ?  04/17/2022  ?  3:47 PM  ?Advanced Directives  ?Does Patient Have a Medical Advance Directive? Yes  ?Type of Advance Directive Living will;Out of facility DNR (pink MOST or yellow form)  ?Does patient want to make changes to medical advance directive? No - Patient declined  ?Copy of [place] in Chart? Yes - validated most recent copy scanned in chart (See row information)  ?Pre-existing out of facility DNR order (yellow form or pink MOST form) Yellow form placed in chart (order not valid for inpatient use);Pink MOST form placed in chart (order not valid for inpatient use)  ? ? ? ?Chief Complaint  ?Patient presents with  ? Medical Management of Chronic Issues  ? ? ?HPI:  ?Pt is a 88 y.o. female seen today for medical management of chronic diseases.   ? ? ?Past Medical History:  ?Diagnosis Date  ? Abnormal nuclear stress test   ? Actinic cheilitis  04/23/2019  ? Anemia   ? iron deficiency   ? Anxiety   ? Aortic stenosis   ? severe by echo 2021   ? Arthritis   ? Broken arm   ? right   ? Chronic diastolic CHF (congestive heart failure) ([place])   ? Chronic pain   ? Cognitive communication deficit   ? Cognitive deficits   ? Communication  ? Constipation   ? Depression   ? Erosive (osteo)arthritis 03/09/2020  ? Family history of adverse reaction to anesthesia   ? pt. states sister vomits  ? Fibromyalgia   ? GERD (gastroesophageal reflux disease)   ? H/O hiatal hernia   ? Hallucinations, visual 07/21/2018  ? 03/10/20 psych consult.   ? Hematuria 05/18/2020  ? History of bronchitis   ? History of kidney stones   ? Hypercholesterolemia   ? Hypertension   ? dr t turner  ? Hypothyroidism   ? Memory loss 04/21/2018  ? 08/08/18 CT head no traumatic findings, atrophy.  09/27/19 MMSE 25/30, failed the clock drawing  ? Microhematuria 03/24/2015  ? Neuropathy   ? Osteoporosis   ? Parkinson disease ([place])   ? PVD (peripheral vascular disease) ([place])   ? 99% stenosis of left anteiror tibial artery, mod stenosis of left distal SFA and popliteal artery followed by [name]  ? RBBB   ? noted on EKG 2018  ? Restless leg syndrome   ? Sepsis ([place])   ? hx of due to [facility]  ? Septic shock ([place]) 04/19/2020  ? Shortness of breath   ? with exertion - chronic   ?  Sjogren's disease ([place])   ? Sjogren's syndrome ([place]) 02/04/2018  ? 04/16/19 rheumatology: Erosive OA of hands, Sjogrens syndrome, low back pain at multiple sites, recurrent kidney stones, age related osteoporosis w/o current pathological fracture, f/u 6 months.   ? SOB (shortness of breath)   ? chronic due to diastolic dysfunction, deconditioning, obesity  ? Spinal stenosis   ? lumbar region   ? Tremor   ? Unstable gait 04/21/2018  ? Unsteadiness on feet   ? Urinary frequency 09/18/2018  ? Urinary tract infection   ? Visual hallucinations   ? ?Past Surgical History:  ?Procedure Laterality Date  ? ABDOMINAL HYSTERECTOMY    ? BACK SURGERY     ? 4 back surgeries,   lumbar fusion  ? CARDIAC CATHETERIZATION  2006  ? normal  ? CYSTOSCOPY/URETEROSCOPY/HOLMIUM LASER/STENT PLACEMENT Left 01/06/2019  ? Procedure: CYSTOSCOPY/RETROGRADE/URETEROSCOPY/HOLMIUM LASER/BASKET RETRIEVAL/STENT PLACEMENT;  Surgeon: Ceasar Mons, MD;  Location: [facility];  Service: Urology;  Laterality: Left;  ? CYSTOSCOPY/URETEROSCOPY/HOLMIUM LASER/STENT PLACEMENT Bilateral 05/24/2020  ? Procedure: TKWIOXBDZH/GDJMEQASTM/HDQQIWLNLGXQ/JJHERDE LASER/STENT PLACEMENT;  Surgeon: Ceasar Mons, MD;  Location: [facility];  Service: Urology;  Laterality: Bilateral;  ? CYSTOSCOPY/URETEROSCOPY/HOLMIUM LASER/STENT PLACEMENT Right 09/27/2021  ? Procedure: CYSTOSCOPY/BILATERAL RETROGRADE/URETEROSCOPY/HOLMIUM LASER/STENT PLACEMENT;  Surgeon: Ceasar Mons, MD;  Location: [facility];  Service: Urology;  Laterality: Right;  ONLY NEEDS 60 MIN  ? EYE SURGERY Bilateral   ? cateracts  ? falls    ? variious fall, broken wrist,and toes  ? FRACTURE SURGERY Right April 2016  ? Wrist, Pt. fell  ? IR NEPHROSTOMY PLACEMENT RIGHT  04/19/2020  ? kidney stone removal Left 01/20/2019  ? RIGHT/LEFT HEART CATH AND CORONARY ANGIOGRAPHY N/A 02/05/2018  ? Procedure: RIGHT/LEFT HEART CATH AND CORONARY ANGIOGRAPHY;  Surgeon: Troy Sine, MD;  Location: [facility];  Service: Cardiovascular;  Laterality: N/A;  ? SPINAL CORD STIMULATOR INSERTION N/A 09/09/2015  ? Procedure: LUMBAR SPINAL CORD STIMULATOR INSERTION;  Surgeon: Clydell Hakim, MD;  Location: [facility];  Service: Neurosurgery;  Laterality: N/A;  LUMBAR SPINAL CORD STIMULATOR INSERTION  ? SPINE SURGERY  April 2013  ? Back X's 4  ? TOTAL HIP ARTHROPLASTY    ? right  ? WRIST FRACTURE SURGERY Bilateral   ? ? ?Allergies  ?Allergen Reactions  ? Adrenalone   ? Lactose Other (See Comments)  ?  abd pain, lactose intolerant  ? Morphine Other (See Comments)  ? Sulfa Antibiotics Other (See Comments)  ?  Headache, very sick  ? Codeine Other (See  Comments)  ?  Reaction:  Headaches and nightmares   ? Lactose Intolerance (Gi) Nausea And Vomiting  ? Latex Rash  ? Lyrica [Pregabalin] Swelling and Other (See Comments)  ?  Reaction:  Leg swelling  ? Other Other (See Comments)  ?  Pt states that pain medications give her nightmares.    ? Plaquenil [Hydroxychloroquine Sulfate] Other (See Comments)  ?  Reaction:  GI upset   ? Reglan [Metoclopramide] Other (See Comments)  ?  Reaction:  GI upset   ? Requip [Ropinirole Hcl] Other (See Comments)  ?  Reaction:  GI upset   ? Septra [Sulfamethoxazole-Trimethoprim] Nausea And Vomiting  ? Shellfish Allergy Nausea And Vomiting  ? ? ?Allergies as of 04/17/2022   ? ?   Reactions  ? Adrenalone   ? Lactose Other (See Comments)  ? abd pain, lactose intolerant  ? Morphine Other (See Comments)  ? Sulfa Antibiotics Other (See Comments)  ? Headache, very sick  ?  Codeine Other (See Comments)  ? Reaction:  Headaches and nightmares   ? Lactose Intolerance (gi) Nausea And Vomiting  ? Latex Rash  ? Lyrica [pregabalin] Swelling, Other (See Comments)  ? Reaction:  Leg swelling  ? Other Other (See Comments)  ? Pt states that pain medications give her nightmares.    ? Plaquenil [hydroxychloroquine Sulfate] Other (See Comments)  ? Reaction:  GI upset   ? Reglan [metoclopramide] Other (See Comments)  ? Reaction:  GI upset   ? Requip [ropinirole Hcl] Other (See Comments)  ? Reaction:  GI upset   ? Septra [sulfamethoxazole-trimethoprim] Nausea And Vomiting  ? Shellfish Allergy Nausea And Vomiting  ? ?  ? ?  ?Medication List  ?  ? ?  ? Accurate as of Apr 17, 2022  3:47 PM. If you have any questions, ask your nurse or doctor.  ?  ?  ? ?  ? ?acetaminophen 500 MG tablet ?Commonly known as: TYLENOL ?Take 500 mg by mouth See admin instructions. Take 1 tablet (500 mg) by mouth scheduled 3 times daily scheduled & 3 times daily as needed for pain (Not to exceed 3g/24 hrs) ?  ?aspirin 81 MG chewable tablet ?Chew 81 mg by mouth daily. ?  ?Calcium 600/Vitamin D3  600-20 MG-MCG Tabs ?Generic drug: Calcium Carb-Cholecalciferol ?Take 1 tablet by mouth daily. ?  ?carbidopa-levodopa 25-100 MG tablet ?Commonly known as: SINEMET IR ?Take 1 tablet by mouth 3 (three) ti

## 2022-04-17 NOTE — Progress Notes (Signed)
This encounter was created in error - please disregard.

## 2022-04-18 DIAGNOSIS — K219 Gastro-esophageal reflux disease without esophagitis: Secondary | ICD-10-CM | POA: Diagnosis not present

## 2022-04-18 DIAGNOSIS — R0602 Shortness of breath: Secondary | ICD-10-CM | POA: Diagnosis not present

## 2022-04-18 DIAGNOSIS — G20 Parkinson's disease: Secondary | ICD-10-CM | POA: Diagnosis not present

## 2022-04-18 DIAGNOSIS — R498 Other voice and resonance disorders: Secondary | ICD-10-CM | POA: Diagnosis not present

## 2022-04-19 DIAGNOSIS — G20 Parkinson's disease: Secondary | ICD-10-CM | POA: Diagnosis not present

## 2022-04-19 DIAGNOSIS — K219 Gastro-esophageal reflux disease without esophagitis: Secondary | ICD-10-CM | POA: Diagnosis not present

## 2022-04-19 DIAGNOSIS — R498 Other voice and resonance disorders: Secondary | ICD-10-CM | POA: Diagnosis not present

## 2022-04-19 DIAGNOSIS — R0602 Shortness of breath: Secondary | ICD-10-CM | POA: Diagnosis not present

## 2022-04-20 DIAGNOSIS — H43813 Vitreous degeneration, bilateral: Secondary | ICD-10-CM | POA: Diagnosis not present

## 2022-04-20 DIAGNOSIS — H35433 Paving stone degeneration of retina, bilateral: Secondary | ICD-10-CM | POA: Diagnosis not present

## 2022-04-20 DIAGNOSIS — H353132 Nonexudative age-related macular degeneration, bilateral, intermediate dry stage: Secondary | ICD-10-CM | POA: Diagnosis not present

## 2022-04-23 DIAGNOSIS — K219 Gastro-esophageal reflux disease without esophagitis: Secondary | ICD-10-CM | POA: Diagnosis not present

## 2022-04-23 DIAGNOSIS — R498 Other voice and resonance disorders: Secondary | ICD-10-CM | POA: Diagnosis not present

## 2022-04-23 DIAGNOSIS — G20 Parkinson's disease: Secondary | ICD-10-CM | POA: Diagnosis not present

## 2022-04-23 DIAGNOSIS — R0602 Shortness of breath: Secondary | ICD-10-CM | POA: Diagnosis not present

## 2022-04-24 DIAGNOSIS — R0602 Shortness of breath: Secondary | ICD-10-CM | POA: Diagnosis not present

## 2022-04-24 DIAGNOSIS — K219 Gastro-esophageal reflux disease without esophagitis: Secondary | ICD-10-CM | POA: Diagnosis not present

## 2022-04-24 DIAGNOSIS — R498 Other voice and resonance disorders: Secondary | ICD-10-CM | POA: Diagnosis not present

## 2022-04-24 DIAGNOSIS — G20 Parkinson's disease: Secondary | ICD-10-CM | POA: Diagnosis not present

## 2022-04-27 DIAGNOSIS — R0602 Shortness of breath: Secondary | ICD-10-CM | POA: Diagnosis not present

## 2022-04-27 DIAGNOSIS — Z23 Encounter for immunization: Secondary | ICD-10-CM | POA: Diagnosis not present

## 2022-04-27 DIAGNOSIS — R498 Other voice and resonance disorders: Secondary | ICD-10-CM | POA: Diagnosis not present

## 2022-04-27 DIAGNOSIS — K219 Gastro-esophageal reflux disease without esophagitis: Secondary | ICD-10-CM | POA: Diagnosis not present

## 2022-04-27 DIAGNOSIS — G20 Parkinson's disease: Secondary | ICD-10-CM | POA: Diagnosis not present

## 2022-04-30 DIAGNOSIS — G20 Parkinson's disease: Secondary | ICD-10-CM | POA: Diagnosis not present

## 2022-04-30 DIAGNOSIS — R498 Other voice and resonance disorders: Secondary | ICD-10-CM | POA: Diagnosis not present

## 2022-04-30 DIAGNOSIS — R0602 Shortness of breath: Secondary | ICD-10-CM | POA: Diagnosis not present

## 2022-04-30 DIAGNOSIS — K219 Gastro-esophageal reflux disease without esophagitis: Secondary | ICD-10-CM | POA: Diagnosis not present

## 2022-05-02 DIAGNOSIS — R498 Other voice and resonance disorders: Secondary | ICD-10-CM | POA: Diagnosis not present

## 2022-05-02 DIAGNOSIS — R0602 Shortness of breath: Secondary | ICD-10-CM | POA: Diagnosis not present

## 2022-05-02 DIAGNOSIS — G20 Parkinson's disease: Secondary | ICD-10-CM | POA: Diagnosis not present

## 2022-05-02 DIAGNOSIS — K219 Gastro-esophageal reflux disease without esophagitis: Secondary | ICD-10-CM | POA: Diagnosis not present

## 2022-05-03 ENCOUNTER — Ambulatory Visit (INDEPENDENT_AMBULATORY_CARE_PROVIDER_SITE_OTHER): Payer: Medicare Other | Admitting: Podiatry

## 2022-05-03 DIAGNOSIS — B351 Tinea unguium: Secondary | ICD-10-CM | POA: Diagnosis not present

## 2022-05-03 DIAGNOSIS — M79675 Pain in left toe(s): Secondary | ICD-10-CM

## 2022-05-03 DIAGNOSIS — G629 Polyneuropathy, unspecified: Secondary | ICD-10-CM

## 2022-05-03 DIAGNOSIS — M79674 Pain in right toe(s): Secondary | ICD-10-CM

## 2022-05-04 ENCOUNTER — Encounter: Payer: Self-pay | Admitting: Orthopedic Surgery

## 2022-05-04 ENCOUNTER — Non-Acute Institutional Stay (SKILLED_NURSING_FACILITY): Payer: Medicare Other | Admitting: Orthopedic Surgery

## 2022-05-04 DIAGNOSIS — G2581 Restless legs syndrome: Secondary | ICD-10-CM | POA: Diagnosis not present

## 2022-05-04 DIAGNOSIS — D508 Other iron deficiency anemias: Secondary | ICD-10-CM

## 2022-05-04 DIAGNOSIS — I5032 Chronic diastolic (congestive) heart failure: Secondary | ICD-10-CM

## 2022-05-04 DIAGNOSIS — K219 Gastro-esophageal reflux disease without esophagitis: Secondary | ICD-10-CM

## 2022-05-04 DIAGNOSIS — F323 Major depressive disorder, single episode, severe with psychotic features: Secondary | ICD-10-CM | POA: Diagnosis not present

## 2022-05-04 DIAGNOSIS — G20 Parkinson's disease: Secondary | ICD-10-CM | POA: Diagnosis not present

## 2022-05-04 DIAGNOSIS — K5909 Other constipation: Secondary | ICD-10-CM | POA: Diagnosis not present

## 2022-05-04 DIAGNOSIS — M3500 Sicca syndrome, unspecified: Secondary | ICD-10-CM

## 2022-05-04 DIAGNOSIS — M154 Erosive (osteo)arthritis: Secondary | ICD-10-CM | POA: Diagnosis not present

## 2022-05-04 DIAGNOSIS — R300 Dysuria: Secondary | ICD-10-CM | POA: Diagnosis not present

## 2022-05-04 DIAGNOSIS — E039 Hypothyroidism, unspecified: Secondary | ICD-10-CM

## 2022-05-04 DIAGNOSIS — N390 Urinary tract infection, site not specified: Secondary | ICD-10-CM | POA: Diagnosis not present

## 2022-05-04 NOTE — Progress Notes (Signed)
Location:  [facility] Room Number: 45 Place of Service:  [phone]) Provider:  Yvonna Alanis, NP   [name], MD  Patient Care Team: [name], MD as PCP - General (Internal Medicine) [name], MD as PCP - Cardiology (Cardiology) [name], MD (Neurosurgery) [name], MD (Neurology) [name], MD (Inactive) as Consulting Physician (Gastroenterology) [name], MD as Consulting Physician (Urology) [name], Man X, NP as Nurse Practitioner (Internal Medicine) Tat, Eustace Quail, DO as Consulting Physician (Neurology)  Extended Emergency Contact Information Primary Emergency Contact: [facility] Address: [address]          [place], [place] [address] [name] of [place] Phone: [phone] Mobile Phone: [phone] Relation: Spouse Secondary Emergency Contact: [name] Mobile Phone: [phone] Relation: Daughter Preferred language: [name] Interpreter needed? No  Code Status:  Full code Goals of care: Advanced Directive information    04/17/2022    3:47 PM  Advanced Directives  Does Patient Have a Medical Advance Directive? Yes  Type of Advance Directive Living will;Out of facility DNR (pink MOST or yellow form)  Does patient want to make changes to medical advance directive? No - Patient declined  Copy of [place] in Chart? Yes - validated most recent copy scanned in chart (See row information)  Pre-existing out of facility DNR order (yellow form or pink MOST form) Yellow form placed in chart (order not valid for inpatient use);Pink MOST form placed in chart (order not valid for inpatient use)     Chief Complaint  Patient presents with   Medical Management of Chronic Issues    HPI:  Pt is a 88 y.o. female seen today for medical management of chronic diseases.    She currently resides on the skilled nursing unit at [facility]. Past medical history  includes: aortic stenosis, CHF, HTN, PVD, RBBB, barrett esophagus, constipation, GERD, hypothyroidism, parkinson's, OA, osteoporosis, CKD stage III, chronic back pain, unstable gait, iron deficiency anemia, and memory loss.   CHF- LV EF 60-65 % 2021, followed by cardiology, no weight fluctuations, reports some sob with exertion and ankle edema, furosemide 20 mg daily- BUN/creat 20/1.0 03/31/2022 Recurrent UTI/dysuria- Hx of renal lithiasis/ recurrent UTI/ urosepsis, off Keflex for suppression therapy, followed by urology, 03/30/22 urine culture showed E Coli >100,000c/ml susceptible to Cipro, 05/23/2021 urine culture P Aeruginosa >100,000c/ml susceptible to cephalosporins, 06/30/2021 Urine culture PS Aeruginosa >100,000c/ml, susceptible to Imipenem/Gentamicin/Tobramycin, seen by ID in past due to frequent infections, continues to report daily dysuria Parkinson's- followed by Dr. Carles Collet- seen 08/26, weaned off pramipexole due to hallucinations/delusions, remains on carbidopa/levodopa Erosive arthritis- followed by rheumatology, remains on Plaquenil and tylenol Depression- no mood changes, hallucinations thought to be related to frequent UTI, remains on Seroquel and Zoloft RLS- remains on Mirapex and Sinemet qhs Constipation- LBM 05/26, remains on senna and miralax GERD- hgb 9.6 03/31/2022, Pepcid and Protonix Hypothyroidism- TSH 1.67 01/11/2022, levothyroxine Anemia- hgb 9.6 03/31/2022, ferrous sulfate daily Sjogren syndrome- dry eyes most bothersome, remains on artificial tears  Received covid booster 04/12/2022, tolerated well.   Saw podiatry 05/25, toenails trimmed, tolerated well.   Recent blood pressures:  05/24- 122/74  05/17- 100/66  05/10- 101/60  Recent weights:  05/01- 133.2 lbs  04/01- 134.1 lbs  03/01- 134.1 lbs    Past Medical History:  Diagnosis Date   Abnormal nuclear stress test    Actinic cheilitis 04/23/2019   Anemia    iron deficiency  Anxiety    Aortic stenosis     severe by echo 2021    Arthritis    Broken arm    right    Chronic diastolic CHF (congestive heart failure) (HCC)    Chronic pain    Cognitive communication deficit    Cognitive deficits    Communication   Constipation    Depression    Erosive (osteo)arthritis 03/09/2020   Family history of adverse reaction to anesthesia    pt. states sister vomits   Fibromyalgia    GERD (gastroesophageal reflux disease)    H/O hiatal hernia    Hallucinations, visual 07/21/2018   03/10/20 psych consult.    Hematuria 05/18/2020   History of bronchitis    History of kidney stones    Hypercholesterolemia    Hypertension    dr t turner   Hypothyroidism    Memory loss 04/21/2018   08/08/18 CT head no traumatic findings, atrophy.  09/27/19 MMSE 25/30, failed the clock drawing   Microhematuria 03/24/2015   Neuropathy    Osteoporosis    Parkinson disease (HCC)    PVD (peripheral vascular disease) (HCC)    99% stenosis of left anteiror tibial artery, mod stenosis of left distal SFA and popliteal artery followed by [name]   RBBB    noted on EKG 2018   Restless leg syndrome    Sepsis ([place])    hx of due to Ecoli   Septic shock ([place]) 04/19/2020   Shortness of breath    with exertion - chronic    Sjogren's disease ([place])    Sjogren's syndrome ([place]) 02/04/2018   04/16/19 rheumatology: Erosive OA of hands, Sjogrens syndrome, low back pain at multiple sites, recurrent kidney stones, age related osteoporosis w/o current pathological fracture, f/u 6 months.    SOB (shortness of breath)    chronic due to diastolic dysfunction, deconditioning, obesity   Spinal stenosis    lumbar region    Tremor    Unstable gait 04/21/2018   Unsteadiness on feet    Urinary frequency 09/18/2018   Urinary tract infection    Visual hallucinations    Past Surgical History:  Procedure Laterality Date   ABDOMINAL HYSTERECTOMY     BACK SURGERY     4 back surgeries,   lumbar fusion   CARDIAC CATHETERIZATION  2006    normal   CYSTOSCOPY/URETEROSCOPY/HOLMIUM LASER/STENT PLACEMENT Left 01/06/2019   Procedure: CYSTOSCOPY/RETROGRADE/URETEROSCOPY/HOLMIUM LASER/BASKET RETRIEVAL/STENT PLACEMENT;  Surgeon: Ceasar Mons, MD;  Location: [facility];  Service: Urology;  Laterality: Left;   CYSTOSCOPY/URETEROSCOPY/HOLMIUM LASER/STENT PLACEMENT Bilateral 05/24/2020   Procedure: CYSTOSCOPY/RETROGRADE/URETEROSCOPY/HOLMIUM LASER/STENT PLACEMENT;  Surgeon: Ceasar Mons, MD;  Location: [facility];  Service: Urology;  Laterality: Bilateral;   CYSTOSCOPY/URETEROSCOPY/HOLMIUM LASER/STENT PLACEMENT Right 09/27/2021   Procedure: CYSTOSCOPY/BILATERAL RETROGRADE/URETEROSCOPY/HOLMIUM LASER/STENT PLACEMENT;  Surgeon: Ceasar Mons, MD;  Location: [facility];  Service: Urology;  Laterality: Right;  ONLY NEEDS 60 MIN   EYE SURGERY Bilateral    cateracts   falls     variious fall, broken wrist,and toes   FRACTURE SURGERY Right April 2016   Wrist, Pt. fell   IR NEPHROSTOMY PLACEMENT RIGHT  04/19/2020   kidney stone removal Left 01/20/2019   RIGHT/LEFT HEART CATH AND CORONARY ANGIOGRAPHY N/A 02/05/2018   Procedure: RIGHT/LEFT HEART CATH AND CORONARY ANGIOGRAPHY;  Surgeon: Troy Sine, MD;  Location: [facility];  Service: Cardiovascular;  Laterality: N/A;   SPINAL CORD STIMULATOR INSERTION N/A 09/09/2015   Procedure: LUMBAR SPINAL CORD STIMULATOR INSERTION;  Surgeon: Clydell Hakim, MD;  Location: [facility];  Service: Neurosurgery;  Laterality: N/A;  LUMBAR SPINAL CORD STIMULATOR INSERTION   SPINE SURGERY  April 2013   Back X's 4   TOTAL HIP ARTHROPLASTY     right   WRIST FRACTURE SURGERY Bilateral     Allergies  Allergen Reactions   Adrenalone    Lactose Other (See Comments)    abd pain, lactose intolerant   Morphine Other (See Comments)   Sulfa Antibiotics Other (See Comments)    Headache, very sick   Codeine Other (See Comments)    Reaction:  Headaches and nightmares    Lactose Intolerance  (Gi) Nausea And Vomiting   Latex Rash   Lyrica [Pregabalin] Swelling and Other (See Comments)    Reaction:  Leg swelling   Other Other (See Comments)    Pt states that pain medications give her nightmares.     Plaquenil [Hydroxychloroquine Sulfate] Other (See Comments)    Reaction:  GI upset    Reglan [Metoclopramide] Other (See Comments)    Reaction:  GI upset    Requip [Ropinirole Hcl] Other (See Comments)    Reaction:  GI upset    Septra [Sulfamethoxazole-Trimethoprim] Nausea And Vomiting   Shellfish Allergy Nausea And Vomiting    Outpatient Encounter Medications as of 05/04/2022  Medication Sig   acetaminophen (TYLENOL) 500 MG tablet Take 500 mg by mouth See admin instructions. Take 1 tablet (500 mg) by mouth scheduled 3 times daily scheduled & 3 times daily as needed for pain (Not to exceed 3g/24 hrs)   aspirin 81 MG chewable tablet Chew 81 mg by mouth daily.   Calcium Carb-Cholecalciferol (CALCIUM 600/VITAMIN D3) 600-800 MG-UNIT TABS Take 1 tablet by mouth daily.   carbidopa-levodopa (SINEMET IR) 25-100 MG tablet Take 1 tablet by mouth 3 (three) times daily.   carbidopa-levodopa (SINEMET IR) 25-100 MG tablet Take 1 tablet by mouth at bedtime.   Cholecalciferol (VITAMIN D) 50 MCG (2000 UT) tablet Take 2,000 Units by mouth daily.   docusate sodium (COLACE) 100 MG capsule Take 100 mg by mouth at bedtime.   famotidine (PEPCID) 20 MG tablet Take 20 mg by mouth daily.   Ferrous Sulfate (SLOW FE) 142 (45 Fe) MG TBCR Take 142 mg by mouth in the morning.   furosemide (LASIX) 20 MG tablet Take 1 tablet (20 mg total) by mouth daily.   furosemide (LASIX) 20 MG tablet Take 20 mg by mouth. Give '20mg'$  PO QD PRN for lower extremity swelling.   hydroxychloroquine (PLAQUENIL) 200 MG tablet Take 200 mg by mouth daily.   lactose free nutrition (BOOST) LIQD Take by mouth 2 (two) times daily. 1/2 box; Resident likes chocolate. Twice A Day   levothyroxine (SYNTHROID, LEVOTHROID) 50 MCG tablet Take 50  mcg by mouth daily before breakfast.    loperamide (IMODIUM A-D) 2 MG tablet Take 2 mg by mouth every 6 (six) hours as needed for diarrhea or loose stools.   lubiprostone (AMITIZA) 24 MCG capsule Take 24 mcg by mouth 2 (two) times daily with a meal.    Multiple Vitamin (MULTIVITAMIN WITH MINERALS) TABS tablet Take 1 tablet by mouth daily.   Multiple Vitamins-Minerals (PRESERVISION AREDS 2 PO) Take 1 tablet by mouth daily.   NYAMYC powder Apply topically.   pantoprazole (PROTONIX) 40 MG tablet Take 40 mg by mouth daily.   Polyethyl Glycol-Propyl Glycol (SYSTANE) 0.4-0.3 % SOLN Apply 1 drop to eye 2 (two) times daily as needed (dry eyes).  polyethylene glycol (MIRALAX / GLYCOLAX) 17 g packet Take 17 g by mouth daily.   potassium citrate (UROCIT-K) 10 MEQ (1080 MG) SR tablet Take 10 mEq by mouth 2 (two) times daily.   pramipexole (MIRAPEX) 0.125 MG tablet Take 0.125 mg by mouth at bedtime.   QUEtiapine (SEROQUEL) 25 MG tablet Take 25 mg by mouth See admin instructions.   QUEtiapine (SEROQUEL) 25 MG tablet Take 12.5 mg by mouth every morning.   rosuvastatin (CRESTOR) 20 MG tablet Take 10 mg by mouth daily.   saccharomyces boulardii (FLORASTOR) 250 MG capsule Take 250 mg by mouth daily.   senna-docusate (SENOKOT-S) 8.6-50 MG tablet Take 1 tablet by mouth at bedtime.    sertraline (ZOLOFT) 50 MG tablet Take 100 mg by mouth at bedtime.   Skin Protectants, Misc. (EUCERIN) cream Apply 1 application topically daily.   No facility-administered encounter medications on file as of 05/04/2022.    Review of Systems  Constitutional:  Negative for activity change, appetite change, chills, fatigue and fever.  HENT:  Negative for congestion and trouble swallowing.   Eyes:  Negative for visual disturbance.  Respiratory:  Positive for shortness of breath. Negative for cough and wheezing.   Cardiovascular:  Positive for leg swelling. Negative for chest pain.  Gastrointestinal:  Positive for constipation and  diarrhea. Negative for abdominal distention, abdominal pain, nausea and vomiting.  Genitourinary:  Positive for dysuria and frequency. Negative for hematuria.  Musculoskeletal:  Positive for arthralgias, back pain and gait problem.  Skin:  Negative for wound.  Neurological:  Positive for weakness and numbness. Negative for dizziness and headaches.  Psychiatric/Behavioral:  Positive for dysphoric mood and hallucinations. Negative for sleep disturbance. The patient is not nervous/anxious.    Immunization History  Administered Date(s) Administered   Hepatitis A, Adult 03/01/1997, 09/20/1997   Influenza Split 09/09/2013, 09/27/2014   Influenza, High Dose Seasonal PF 08/01/2011, 08/27/2012, 10/14/2013, 09/08/2015, 09/10/2016, 09/12/2017, 09/12/2018, 09/15/2019   Influenza,inj,Quad PF,6+ Mos 09/11/2016   Influenza-Unspecified 09/21/2020, 09/28/2021   Moderna Sars-Covid-2 Vaccination 12/12/2019, 01/09/2020, 10/18/2020, 05/09/2021, 08/29/2021   Pneumococcal Conjugate-13 04/15/2014   Pneumococcal Polysaccharide-23 09/28/2005   Td 06/20/2016   Tdap 04/29/2006, 09/01/2015   Zoster Recombinat (Shingrix) 06/11/2018, 09/10/2018   Zoster, Live 04/29/2006   Pertinent  Health Maintenance Due  Topic Date Due   INFLUENZA VACCINE  07/10/2022   DEXA SCAN  Completed      08/04/2021    1:31 PM 08/09/2021   10:17 AM 09/27/2021    7:24 AM 10/10/2021   11:15 AM 03/13/2022    2:41 PM  Fall Risk  Falls in the past year? '1 1  1 '$ 0  Was there an injury with Fall? 1 1  0 0  Fall Risk Category Calculator '3 2  2 '$ 0  Fall Risk Category High Moderate  Moderate Low  Patient Fall Risk Level High fall risk High fall risk High fall risk Moderate fall risk   Patient at Risk for Falls Due to  History of fall(s)     Fall risk Follow up  Falls evaluation completed;Education provided      Functional Status Survey:    Vitals:   05/04/22 1537  BP: 122/74  Pulse: 68  Resp: 18  Temp: (!) 97.5 F (36.4 C)  SpO2: 96%   Weight: 133 lb 12.8 oz (60.7 kg)  Height: '5\' 2"'$  (1.575 m)   Body mass index is 24.47 kg/m. Physical Exam Vitals reviewed.  Constitutional:      General: She is not  in acute distress. HENT:     Head: Normocephalic.     Right Ear: There is no impacted cerumen.     Left Ear: There is no impacted cerumen.     Nose: Nose normal.     Mouth/Throat:     Mouth: Mucous membranes are moist.  Eyes:     General:        Right eye: No discharge.        Left eye: No discharge.  Cardiovascular:     Rate and Rhythm: Normal rate and regular rhythm.     Pulses: Normal pulses.     Heart sounds: Murmur heard.  Pulmonary:     Effort: Pulmonary effort is normal. No respiratory distress.     Breath sounds: Normal breath sounds. No wheezing.  Abdominal:     General: Bowel sounds are normal. There is no distension.     Palpations: Abdomen is soft.     Tenderness: There is no abdominal tenderness.  Musculoskeletal:     Cervical back: Neck supple.     Right lower leg: Edema present.     Left lower leg: Edema present.     Comments: Non pitting  Skin:    General: Skin is warm and dry.     Capillary Refill: Capillary refill takes less than 2 seconds.  Neurological:     General: No focal deficit present.     Mental Status: She is alert. Mental status is at baseline.     Motor: Weakness present.     Gait: Gait abnormal.     Comments: wheelchair  Psychiatric:        Mood and Affect: Mood normal.        Behavior: Behavior normal.    Labs reviewed: Recent Labs    09/27/21 0720 01/11/22 0000 03/31/22 0000  NA 140 141 140  K 4.3 4.4 4.6  CL 108 106 108  CO2 24 24* 23*  GLUCOSE 90  --   --   BUN '14 20 20  '$ CREATININE 0.85 1.0 1.0  CALCIUM 9.4 9.5 9.4   Recent Labs    01/11/22 0000 03/31/22 0000  AST  --  18  ALT  --  12  ALKPHOS  --  76  ALBUMIN 3.8 3.6   Recent Labs    09/27/21 0720 09/27/21 0720 10/05/21 0000 10/09/21 0000 11/23/21 0000 03/31/22 0000  WBC 5.0   < > 5.1  5.4 6.9 8.8  NEUTROABS  --   --  2,739.00 2,538.00  --  5,342.00  HGB 8.9*  --  8.0* 8.0* 10.1* 9.6*  HCT 29.4*  --  26* 26* 31* 30*  MCV 98.3  --   --   --   --   --   PLT 307  --  253 281 268 248   < > = values in this interval not displayed.   Lab Results  Component Value Date   TSH 1.67 01/11/2022   No results found for: HGBA1C Lab Results  Component Value Date   CHOL 137 01/11/2022   HDL 47 01/11/2022   LDLCALC 64 01/11/2022   TRIG 184 (A) 01/11/2022   CHOLHDL 2.4 07/14/2019    Significant Diagnostic Results in last 30 days:  No results found.  Assessment/Plan 1. Chronic diastolic CHF (congestive heart failure) (HCC) - no weight fluctuations, some sob and ankle edema - cont furosemide  2. Recurrent UTI - 03/30/22 urine culture showed E Coli >100,000c/ml susceptible to Cipro - continues to  have dysuria - evaluated by urology and ID in past - encouraged to drink water  3. Dysuria - see above  4. Parkinson's disease ([place]) - followed by neurology - cont Sinemet  - weaned off pramipexole due to hallucinations/delusions  5. Erosive (osteo)arthritis - followed by rheumatology - cont Plaquenil and tylenol  6. Depression, psychotic ([place]) - no mood changes - cont Seroquel and Zoloft  7. Restless leg syndrome - cont Mirapex and Sinemet  8. Chronic constipation - cont colace and miralax  9. Gastroesophageal reflux disease, unspecified whether esophagitis present - hgb stable - cont Pepcid and Protonix  10. Acquired hypothyroidism - TSH stable - cont levothyroxine  11. Iron deficiency anemia secondary to inadequate dietary iron intake - cont ferrous sulfate  12. Sjogren's syndrome, with unspecified organ involvement ([place]) - cont artificial tears   Total time: 46 minutes. Greater than 50% of total time spent doing patient education on symptom/medication management regarding dysuria, falls prevention, and pain.    Family/ staff Communication: plan  discussed with patient and nurse  Labs/tests ordered:  none

## 2022-05-06 NOTE — Progress Notes (Signed)
Subjective: 88 y.o. returns the office today for painful, elongated, thickened toenails which she cannot trim herself.  She has no new concerns today.  No swelling or redness or any drainage.   PCP: [name], MD   Objective: AAO 3, NAD DP/PT pulses palpable, CRT less than 3 seconds Mild chronic bilateral lower extremity edema present. Nails hypertrophic, dystrophic, elongated, brittle, discolored 9. There is tenderness overlying the nails 1-5 on the right and 2 through 5 on the left.   Prominent metatarsal heads plantarly. No pain with calf compression, warmth, erythema.  Assessment: Patient presents with symptomatic onychomycosis,  Plan: -Treatment options including alternatives, risks, complications were discussed -Nails sharply debrided 9 without complication/bleeding.  Again discussed nail removal however she seems to doing well with the nail debridements.  We will continue with this for now. -Daily foot inspection.  Trula Slade DPM

## 2022-05-07 DIAGNOSIS — F411 Generalized anxiety disorder: Secondary | ICD-10-CM | POA: Diagnosis not present

## 2022-05-07 DIAGNOSIS — F323 Major depressive disorder, single episode, severe with psychotic features: Secondary | ICD-10-CM | POA: Diagnosis not present

## 2022-05-07 DIAGNOSIS — R441 Visual hallucinations: Secondary | ICD-10-CM | POA: Diagnosis not present

## 2022-05-10 DIAGNOSIS — K219 Gastro-esophageal reflux disease without esophagitis: Secondary | ICD-10-CM | POA: Diagnosis not present

## 2022-05-10 DIAGNOSIS — G20 Parkinson's disease: Secondary | ICD-10-CM | POA: Diagnosis not present

## 2022-05-10 DIAGNOSIS — F323 Major depressive disorder, single episode, severe with psychotic features: Secondary | ICD-10-CM | POA: Diagnosis not present

## 2022-05-10 DIAGNOSIS — R2681 Unsteadiness on feet: Secondary | ICD-10-CM | POA: Diagnosis not present

## 2022-05-10 DIAGNOSIS — M6281 Muscle weakness (generalized): Secondary | ICD-10-CM | POA: Diagnosis not present

## 2022-05-10 DIAGNOSIS — F419 Anxiety disorder, unspecified: Secondary | ICD-10-CM | POA: Diagnosis not present

## 2022-05-10 DIAGNOSIS — R0602 Shortness of breath: Secondary | ICD-10-CM | POA: Diagnosis not present

## 2022-05-10 DIAGNOSIS — G8929 Other chronic pain: Secondary | ICD-10-CM | POA: Diagnosis not present

## 2022-05-10 DIAGNOSIS — R498 Other voice and resonance disorders: Secondary | ICD-10-CM | POA: Diagnosis not present

## 2022-05-21 ENCOUNTER — Encounter: Payer: Self-pay | Admitting: Cardiology

## 2022-05-21 ENCOUNTER — Telehealth: Payer: Self-pay

## 2022-05-21 ENCOUNTER — Ambulatory Visit (INDEPENDENT_AMBULATORY_CARE_PROVIDER_SITE_OTHER): Payer: Medicare Other | Admitting: Cardiology

## 2022-05-21 VITALS — Ht <= 58 in | Wt 132.0 lb

## 2022-05-21 DIAGNOSIS — I5032 Chronic diastolic (congestive) heart failure: Secondary | ICD-10-CM

## 2022-05-21 DIAGNOSIS — I10 Essential (primary) hypertension: Secondary | ICD-10-CM

## 2022-05-21 DIAGNOSIS — E7800 Pure hypercholesterolemia, unspecified: Secondary | ICD-10-CM

## 2022-05-21 DIAGNOSIS — I350 Nonrheumatic aortic (valve) stenosis: Secondary | ICD-10-CM | POA: Diagnosis not present

## 2022-05-21 DIAGNOSIS — I739 Peripheral vascular disease, unspecified: Secondary | ICD-10-CM | POA: Diagnosis not present

## 2022-05-21 MED ORDER — MIDODRINE HCL 2.5 MG PO TABS: 2.5000 mg | ORAL_TABLET | Freq: Three times a day (TID) | ORAL | 3 refills | Status: DC

## 2022-05-21 NOTE — Patient Instructions (Signed)
Medication Instructions:  Your physician recommends that you continue on your current medications as directed. Please refer to the Current Medication list given to you today.  *If you need a refill on your cardiac medications before your next appointment, please call your pharmacy*   Lab Work: None If you have labs (blood work) drawn today and your tests are completely normal, you will receive your results only by: MyChart Message (if you have MyChart) OR A paper copy in the mail If you have any lab test that is abnormal or we need to change your treatment, we will call you to review the results.   Follow-Up: At [facility], you and your health needs are our priority.  As part of our continuing mission to provide you with exceptional heart care, we have created designated Provider Care Teams.  These Care Teams include your primary Cardiologist (physician) and Advanced Practice Providers (APPs -  Physician Assistants and Nurse Practitioners) who all work together to provide you with the care you need, when you need it.   Your next appointment:   1 year(s)  The format for your next appointment:   In Person  Provider:   Traci Turner, MD { 

## 2022-05-21 NOTE — Progress Notes (Addendum)
Cardiology Office Note:    Date:  05/21/2022   ID:  [name], DOB 12-Jan-1931, MRN [id number]  PCP:  [name], MD  Cardiologist:  Fransico Him, MD    Referring MD: [name], MD   Chief Complaint  Patient presents with   Congestive Heart Failure   Aortic Stenosis   Hypertension   Hyperlipidemia    History of Present Illness:    [name] is a 88 y.o. female with a hx of chronic SOB secondary to diastolic dysfunction, chronic diastolic CHF, deconditioning, severe AS and obesity. A heart cath in 2006 showed normal coronary arteries and normal LVF with normal LVEDP. She also has a history of HTN, chronic LE edema and PVD followed by [name]. She was evaluated by Structural heart team and considered not to be a candidate for TAVR and recommended Palliative Care approach.   She is here today for followup and is doing well.  She has chronic SOB and LE edema related to deconditioning, sedentary state and severe AS.  She denies any chest pain or pressure, PND, orthopnea, dizziness, palpitations or syncope. She is compliant with his meds and is tolerating meds with no SE.     Past Medical History:  Diagnosis Date   Abnormal nuclear stress test    Actinic cheilitis 04/23/2019   Anemia    iron deficiency    Anxiety    Aortic stenosis    severe by echo 2021    Arthritis    Broken arm    right    Chronic diastolic CHF (congestive heart failure) ([place])    Chronic pain    Cognitive communication deficit    Cognitive deficits    Communication   Constipation    Depression    Erosive (osteo)arthritis 03/09/2020   Family history of adverse reaction to anesthesia    pt. states sister vomits   Fibromyalgia    GERD (gastroesophageal reflux disease)    H/O hiatal hernia    Hallucinations, visual 07/21/2018   03/10/20 psych consult.    Hematuria 05/18/2020   History of bronchitis    History of kidney stones    Hypercholesterolemia    Hypertension    dr t  Ladarrius Bogdanski   Hypothyroidism    Memory loss 04/21/2018   08/08/18 CT head no traumatic findings, atrophy.  09/27/19 MMSE 25/30, failed the clock drawing   Microhematuria 03/24/2015   Neuropathy    Osteoporosis    Parkinson disease (HCC)    PVD (peripheral vascular disease) (HCC)    99% stenosis of left anteiror tibial artery, mod stenosis of left distal SFA and popliteal artery followed by [name]   RBBB    noted on EKG 2018   Restless leg syndrome    Sepsis ([place])    hx of due to Ecoli   Septic shock ([place]) 04/19/2020   Shortness of breath    with exertion - chronic    Sjogren's disease ([place])    Sjogren's syndrome ([employer] [place]) 02/04/2018   04/16/19 rheumatology: Erosive OA of hands, Sjogrens syndrome, low back pain at multiple sites, recurrent kidney stones, age related osteoporosis w/o current pathological fracture, f/u 6 months.    SOB (shortness of breath)    chronic due to diastolic dysfunction, deconditioning, obesity   Spinal stenosis    lumbar region    Tremor    Unstable gait 04/21/2018   Unsteadiness on feet    Urinary frequency 09/18/2018   Urinary tract infection  Visual hallucinations     Past Surgical History:  Procedure Laterality Date   ABDOMINAL HYSTERECTOMY     BACK SURGERY     4 back surgeries,   lumbar fusion   CARDIAC CATHETERIZATION  2006   normal   CYSTOSCOPY/URETEROSCOPY/HOLMIUM LASER/STENT PLACEMENT Left 01/06/2019   Procedure: CYSTOSCOPY/RETROGRADE/URETEROSCOPY/HOLMIUM LASER/BASKET RETRIEVAL/STENT PLACEMENT;  Surgeon: Ceasar Mons, MD;  Location: [facility];  Service: Urology;  Laterality: Left;   CYSTOSCOPY/URETEROSCOPY/HOLMIUM LASER/STENT PLACEMENT Bilateral 05/24/2020   Procedure: CYSTOSCOPY/RETROGRADE/URETEROSCOPY/HOLMIUM LASER/STENT PLACEMENT;  Surgeon: Ceasar Mons, MD;  Location: [facility];  Service: Urology;  Laterality: Bilateral;   CYSTOSCOPY/URETEROSCOPY/HOLMIUM LASER/STENT PLACEMENT Right 09/27/2021   Procedure:  CYSTOSCOPY/BILATERAL RETROGRADE/URETEROSCOPY/HOLMIUM LASER/STENT PLACEMENT;  Surgeon: Ceasar Mons, MD;  Location: [facility];  Service: Urology;  Laterality: Right;  ONLY NEEDS 60 MIN   EYE SURGERY Bilateral    cateracts   falls     variious fall, broken wrist,and toes   FRACTURE SURGERY Right April 2016   Wrist, Pt. fell   IR NEPHROSTOMY PLACEMENT RIGHT  04/19/2020   kidney stone removal Left 01/20/2019   RIGHT/LEFT HEART CATH AND CORONARY ANGIOGRAPHY N/A 02/05/2018   Procedure: RIGHT/LEFT HEART CATH AND CORONARY ANGIOGRAPHY;  Surgeon: Troy Sine, MD;  Location: [facility];  Service: Cardiovascular;  Laterality: N/A;   SPINAL CORD STIMULATOR INSERTION N/A 09/09/2015   Procedure: LUMBAR SPINAL CORD STIMULATOR INSERTION;  Surgeon: Clydell Hakim, MD;  Location: [facility];  Service: Neurosurgery;  Laterality: N/A;  LUMBAR SPINAL CORD STIMULATOR INSERTION   SPINE SURGERY  April 2013   Back X's 4   TOTAL HIP ARTHROPLASTY     right   WRIST FRACTURE SURGERY Bilateral     Current Medications: Current Meds  Medication Sig   acetaminophen (TYLENOL) 500 MG tablet Take 500 mg by mouth See admin instructions. Take 1 tablet (500 mg) by mouth scheduled 3 times daily scheduled & 3 times daily as needed for pain (Not to exceed 3g/24 hrs)   aspirin 81 MG chewable tablet Chew 81 mg by mouth daily.   Calcium Carb-Cholecalciferol (CALCIUM 600/VITAMIN D3) 600-800 MG-UNIT TABS Take 1 tablet by mouth daily.   carbidopa-levodopa (SINEMET IR) 25-100 MG tablet Take 1 tablet by mouth 3 (three) times daily.   carbidopa-levodopa (SINEMET IR) 25-100 MG tablet Take 1 tablet by mouth at bedtime.   Cholecalciferol (VITAMIN D) 50 MCG (2000 UT) tablet Take 2,000 Units by mouth daily.   docusate sodium (COLACE) 100 MG capsule Take 100 mg by mouth at bedtime.   famotidine (PEPCID) 20 MG tablet Take 20 mg by mouth daily.   Ferrous Sulfate (SLOW FE) 142 (45 Fe) MG TBCR Take 142 mg by mouth in the  morning.   furosemide (LASIX) 20 MG tablet Take 1 tablet (20 mg total) by mouth daily.   furosemide (LASIX) 20 MG tablet Take 20 mg by mouth. Give '20mg'$  PO QD PRN for lower extremity swelling.   hydroxychloroquine (PLAQUENIL) 200 MG tablet Take 200 mg by mouth daily.   lactose free nutrition (BOOST) LIQD Take by mouth 2 (two) times daily. 1/2 box; Resident likes chocolate. Twice A Day   levothyroxine (SYNTHROID, LEVOTHROID) 50 MCG tablet Take 50 mcg by mouth daily before breakfast.    loperamide (IMODIUM A-D) 2 MG tablet Take 2 mg by mouth every 6 (six) hours as needed for diarrhea or loose stools.   lubiprostone (AMITIZA) 24 MCG capsule Take 24 mcg by mouth 2 (two) times daily with a meal.  Multiple Vitamin (MULTIVITAMIN WITH MINERALS) TABS tablet Take 1 tablet by mouth daily.   Multiple Vitamins-Minerals (PRESERVISION AREDS 2 PO) Take 1 tablet by mouth daily.   NYAMYC powder Apply topically.   pantoprazole (PROTONIX) 40 MG tablet Take 40 mg by mouth daily.   Polyethyl Glycol-Propyl Glycol (SYSTANE) 0.4-0.3 % SOLN Apply 1 drop to eye 2 (two) times daily as needed (dry eyes).    polyethylene glycol (MIRALAX / GLYCOLAX) 17 g packet Take 17 g by mouth daily.   potassium citrate (UROCIT-K) 10 MEQ (1080 MG) SR tablet Take 10 mEq by mouth 2 (two) times daily.   pramipexole (MIRAPEX) 0.125 MG tablet Take 0.125 mg by mouth at bedtime.   QUEtiapine (SEROQUEL) 25 MG tablet Take 25 mg by mouth See admin instructions.   QUEtiapine (SEROQUEL) 25 MG tablet Take 12.5 mg by mouth every morning.   rosuvastatin (CRESTOR) 20 MG tablet Take 10 mg by mouth daily.   saccharomyces boulardii (FLORASTOR) 250 MG capsule Take 250 mg by mouth daily.   senna-docusate (SENOKOT-S) 8.6-50 MG tablet Take 1 tablet by mouth at bedtime.    sertraline (ZOLOFT) 50 MG tablet Take 100 mg by mouth at bedtime.   Skin Protectants, Misc. (EUCERIN) cream Apply 1 application topically daily.     Allergies:   Adrenalone, Codeine,  Lactose, Lactose intolerance (gi), Latex, Lyrica [pregabalin], Morphine, Other, Plaquenil [hydroxychloroquine sulfate], Reglan [metoclopramide], Requip [ropinirole hcl], Septra [sulfamethoxazole-trimethoprim], Shellfish allergy, and Sulfa antibiotics   Social History   Socioeconomic History   Marital status: Married    Spouse name: [name]   Number of children: 2   Years of education: HS   Highest education level: Not on file  Occupational History    Comment: Homemaker  Tobacco Use   Smoking status: Never   Smokeless tobacco: Never  Vaping Use   Vaping Use: Never used  Substance and Sexual Activity   Alcohol use: No    Alcohol/week: 0.0 standard drinks of alcohol   Drug use: No   Sexual activity: Never  Other Topics Concern   Not on file  Social History Narrative   [facility]. Married in 1957. Two people lives in the home, no pets.    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    Caffeine Use: tea   Social Determinants of Health   Financial Resource Strain: Low Risk  (10/27/2018)   Overall Financial Resource Strain (CARDIA)    Difficulty of Paying Living Expenses: Not hard at all  Food Insecurity: No Food Insecurity (10/27/2018)   Hunger Vital Sign    Worried About Running Out of Food in the Last Year: Never true    Ran Out of Food in the Last Year: Never true  Transportation Needs: No Transportation Needs (10/27/2018)   PRAPARE - Hydrologist (Medical): No    Lack of Transportation (Non-Medical): No  Physical Activity: Insufficiently Active (10/27/2018)   Exercise Vital Sign    Days of Exercise per Week: 2 days    Minutes of Exercise per Session: 30 min  Stress: No Stress Concern Present (10/27/2018)   [place]    Feeling of Stress : Only a little  Social Connections: Socially Integrated  (10/27/2018)   Social Connection and Isolation Panel [NHANES]    Frequency of Communication with Friends and Family: More than three times a week  Frequency of Social Gatherings with Friends and Family: More than three times a week    Attends Religious Services: More than 4 times per year    Active Member of [employer] or Organizations: Yes    Attends Music therapist: More than 4 times per year    Marital Status: Married     Family History: The patient's family history includes COPD in her father; Heart attack in her mother; Heart disease in her mother; Heart failure in her mother; Hypertension in her mother; Restless legs syndrome in her mother.  ROS:   Please see the history of present illness.    Review of Systems  Skin:  Negative for rash.    All other systems reviewed and negative.   EKGs/Labs/Other Studies Reviewed:    The following studies were reviewed today: EKG, prior OV notes, labs from PCP  EKG:  EKG is  ordered today.  The ekg ordered today demonstrates NSR with RBBB  Recent Labs: 01/11/2022: TSH 1.67 03/31/2022: ALT 12; BUN 20; Creatinine 1.0; Hemoglobin 9.6; Platelets 248; Potassium 4.6; Sodium 140   Recent Lipid Panel    Component Value Date/Time   CHOL 137 01/11/2022 0000   CHOL 118 07/14/2019 0811   TRIG 184 (A) 01/11/2022 0000   HDL 47 01/11/2022 0000   HDL 50 07/14/2019 0811   CHOLHDL 2.4 07/14/2019 0811   CHOLHDL 2.7 07/01/2018 0715   LDLCALC 64 01/11/2022 0000   LDLCALC 32 07/14/2019 0811   LDLCALC 77 07/01/2018 0715    Physical Exam:    VS:  Ht '4\' 8"'$  (1.422 m)   Wt 132 lb (59.9 kg)   BMI 29.59 kg/m   BP 95 mmHg systolic  Wt Readings from Last 3 Encounters:  05/21/22 132 lb (59.9 kg)  05/04/22 133 lb 12.8 oz (60.7 kg)  04/17/22 134 lb 1.6 oz (60.8 kg)    GEN: Well nourished, well developed in no acute distress HEENT: Normal NECK: No JVD; No carotid bruits LYMPHATICS: No lymphadenopathy CARDIAC:RRR, no rubs, gallops.  2/6 late  peaking systolic murmur at RUSB to LUSB and into the carotids RESPIRATORY:  Clear to auscultation without rales, wheezing or rhonchi  ABDOMEN: Soft, non-tender, non-distended MUSCULOSKELETAL:  No edema; No deformity  SKIN: Warm and dry NEUROLOGIC:  Alert and oriented x 3 PSYCHIATRIC:  Normal affect   ASSESSMENT:    1. Chronic diastolic CHF (congestive heart failure) ([place])   2. Primary hypertension   3. PVD (peripheral vascular disease) ([place])   4. Nonrheumatic aortic valve stenosis   5. Hypercholesterolemia    PLAN:    In order of problems listed above:  1  Chronic diastolic CHF  -this is exacerbated by severe AS -she appears euvolemic on exam today -She has chronic LE edema by the end of the day which is stable - she is very sedentary due to progression of Parkinson's so her LE edema is due to chronic dependent edema from sitting all day -she has chronic SOB >>suspect this is from sedentary state as well as severe AS (BNP was normal on last check) -encouraged her to wear compression hose during the day to help with edema >> she has no edema today -check BNP -Continue prescription drug management Lasix 20 mg daily with as needed Lasix for worsening edema with as needed refills -I have personally reviewed and interpreted outside labs performed by patient's PCP which showed serum creatinine 1 and potassium 4.6 on 03/31/2022  2.  HTN  -BP controlled on exam  and actually on the soft side -add midodrine 2.5 mg 3 times daily with meals and follow-up with PA in 4 weeks  3.  PVD  - s/p 99% stenosis of left anteiror tibial artery, mod stenosis of left distal SFA and popliteal artery followed by [name].   -continue statin   4.  Aortic stenosis -severe symptomatic Stage D1 AS with mean AVG 82mHg by echo 11/2020 -evaluated in structural heart clinic and felt not to be a candidate for TAVR due to generalized deconditioning/severe Frailty and advanced age -now in in SNF -she appears  stable with chronic SOB but no volume overload  5.  Hyperlipidemia  - LDL goal < 70 given PVD.   -I have personally reviewed and interpreted outside labs performed by patient's PCP which showed LDL 64, HDL 47, triglycerides 194 on 01/11/2022 with ALT 12 on 03/31/2022 -Continue prescription drug management with Crestor 40 mg daily with as needed refills  Followup with me in 1 year or sooner if needed  Medication Adjustments/Labs and Tests Ordered: Current medicines are reviewed at length with the patient today.  Concerns regarding medicines are outlined above.  Orders Placed This Encounter  Procedures   EKG 12-Lead   No orders of the defined types were placed in this encounter.   Signed, TFransico Him MD  05/21/2022 10:47 AM    [place]

## 2022-05-21 NOTE — Addendum Note (Signed)
Addended by: Carylon Perches on: 05/21/2022 03:26 PM   Modules accepted: Orders

## 2022-05-21 NOTE — Telephone Encounter (Signed)
Dr Radford Pax would like pt to start taking Proamatine 2.'5mg'$  TID with meals. She also needs a f/u appt with an APP in 4 weeks.   I called pt's home and cell phone, no answer and no VM on either phone.

## 2022-05-21 NOTE — Telephone Encounter (Signed)
[place] to speak to RN regarding her med change. Had to leave a VM.

## 2022-05-21 NOTE — Addendum Note (Signed)
Addended by: Carylon Perches on: 05/21/2022 05:02 PM   Modules accepted: Orders

## 2022-05-29 DIAGNOSIS — R300 Dysuria: Secondary | ICD-10-CM | POA: Diagnosis not present

## 2022-05-29 DIAGNOSIS — G20 Parkinson's disease: Secondary | ICD-10-CM | POA: Diagnosis not present

## 2022-05-29 DIAGNOSIS — F411 Generalized anxiety disorder: Secondary | ICD-10-CM | POA: Diagnosis not present

## 2022-05-29 DIAGNOSIS — K219 Gastro-esophageal reflux disease without esophagitis: Secondary | ICD-10-CM | POA: Diagnosis not present

## 2022-05-29 DIAGNOSIS — R441 Visual hallucinations: Secondary | ICD-10-CM | POA: Diagnosis not present

## 2022-05-29 DIAGNOSIS — R0602 Shortness of breath: Secondary | ICD-10-CM | POA: Diagnosis not present

## 2022-05-29 DIAGNOSIS — R2681 Unsteadiness on feet: Secondary | ICD-10-CM | POA: Diagnosis not present

## 2022-05-29 DIAGNOSIS — R498 Other voice and resonance disorders: Secondary | ICD-10-CM | POA: Diagnosis not present

## 2022-05-29 DIAGNOSIS — F323 Major depressive disorder, single episode, severe with psychotic features: Secondary | ICD-10-CM | POA: Diagnosis not present

## 2022-05-29 DIAGNOSIS — M6281 Muscle weakness (generalized): Secondary | ICD-10-CM | POA: Diagnosis not present

## 2022-05-30 ENCOUNTER — Other Ambulatory Visit: Payer: Self-pay | Admitting: *Deleted

## 2022-05-30 DIAGNOSIS — G20 Parkinson's disease: Secondary | ICD-10-CM | POA: Diagnosis not present

## 2022-05-30 DIAGNOSIS — R0602 Shortness of breath: Secondary | ICD-10-CM | POA: Diagnosis not present

## 2022-05-30 DIAGNOSIS — K219 Gastro-esophageal reflux disease without esophagitis: Secondary | ICD-10-CM | POA: Diagnosis not present

## 2022-05-30 DIAGNOSIS — R498 Other voice and resonance disorders: Secondary | ICD-10-CM | POA: Diagnosis not present

## 2022-05-30 DIAGNOSIS — R2681 Unsteadiness on feet: Secondary | ICD-10-CM | POA: Diagnosis not present

## 2022-05-30 DIAGNOSIS — M6281 Muscle weakness (generalized): Secondary | ICD-10-CM | POA: Diagnosis not present

## 2022-05-30 NOTE — Patient Outreach (Signed)
[facility]) Care Management Telephonic RN Care Manager Note   05/30/2022 Name:  [name] MRN:  [id number] DOB:  [birth year]  Summary: Outreach to [phone] Pt's spouse [name] answered He confirmed with RN CM that [name] is [facility]. She has been in the nursing home since August 2020 Thisi s not a [facility] candidate  Subjective: [name] is an 88 y.o. year old female who is a primary patient of Amanda Dad, MD. The care management team was consulted for assistance with care management and/or care coordination needs.    Telephonic RN Care Manager completed Telephone Visit today.   Objective:  Medications Reviewed Today     Reviewed by [name], CMA (Certified Medical Assistant) on 05/21/22 at [place] List Status: <None>   Medication Order Taking? Sig Documenting Provider Last Dose Status Informant  acetaminophen (TYLENOL) 500 MG tablet [id number] Yes Take 500 mg by mouth See admin instructions. Take 1 tablet (500 mg) by mouth scheduled 3 times daily scheduled & 3 times daily as needed for pain (Not to exceed 3g/24 hrs) [provider] Taking Active Nursing Home Medication Administration Guide (MAG)  aspirin 81 MG chewable tablet [id number] Yes Chew 81 mg by mouth daily. [provider] Taking Active Nursing Home Medication Administration Guide (MAG)  Calcium Carb-Cholecalciferol (CALCIUM 600/VITAMIN D3) 600-800 MG-UNIT TABS [id number] Yes Take 1 tablet by mouth daily. [provider] Taking Active Nursing Home Medication Administration Guide (MAG)  carbidopa-levodopa (SINEMET IR) 25-100 MG tablet [id number] Yes Take 1 tablet by mouth 3 (three) times daily. [provider] Taking Active Nursing Home Medication Administration Guide (MAG)  carbidopa-levodopa (SINEMET IR) 25-100 MG tablet [id number] Yes Take 1 tablet by mouth at bedtime. [provider] Taking Active    Cholecalciferol (VITAMIN D) 50 MCG (2000 UT) tablet [id number] Yes Take 2,000 Units by mouth daily. [provider] Taking Active Nursing Home Medication Administration Guide (MAG)  docusate sodium (COLACE) 100 MG capsule [id number] Yes Take 100 mg by mouth at bedtime. [provider] Taking Active Nursing Home Medication Administration Guide (MAG)  famotidine (PEPCID) 20 MG tablet [id number] Yes Take 20 mg by mouth daily. [provider] Taking Active Nursing Home Medication Administration Guide (MAG)  Ferrous Sulfate (SLOW FE) 142 (45 Fe) MG TBCR [id number] Yes Take 142 mg by mouth in the morning. [provider] Taking Active Nursing Home Medication Administration Guide (MAG)  furosemide (LASIX) 20 MG tablet [id number] Yes Take 1 tablet (20 mg total) by mouth daily. [name], MD Taking Active   furosemide (LASIX) 20 MG tablet [id number] Yes Take 20 mg by mouth. Give '20mg'$  PO QD PRN for lower extremity swelling. [provider] Taking Active   hydroxychloroquine (PLAQUENIL) 200 MG tablet [id number] Yes Take 200 mg by mouth daily. [provider] Taking Active Nursing Home Medication Administration Guide (MAG)  lactose free nutrition (BOOST) LIQD [id number] Yes Take by mouth 2 (two) times daily. 1/2 box; Resident likes chocolate. Twice A Day [provider] Taking Active   levothyroxine (SYNTHROID, LEVOTHROID) 50 MCG tablet [id number] Yes Take 50 mcg by mouth daily before breakfast.  [provider] Taking Active Nursing Home Medication Administration Guide (MAG)  loperamide (IMODIUM A-D) 2 MG tablet [id number] Yes Take 2 mg by mouth every 6 (six) hours as needed for diarrhea or loose stools. [provider] Taking Active   lubiprostone (AMITIZA) 24 MCG capsule [id number] Yes Take  24 mcg by mouth 2 (two) times daily with a meal.  [provider] Taking Active Nursing Home Medication Administration Guide (MAG)  Multiple  Vitamin (MULTIVITAMIN WITH MINERALS) TABS tablet [id number] Yes Take 1 tablet by mouth daily. [provider] Taking Active Nursing Home Medication Administration Guide (MAG)  Multiple Vitamins-Minerals (PRESERVISION AREDS 2 PO) [id number] Yes Take 1 tablet by mouth daily. [provider] Taking Active Nursing Home Medication Administration Guide (MAG)  [facility] powder [id number] Yes Apply topically. [provider] Taking Active   pantoprazole (PROTONIX) 40 MG tablet [id number] Yes Take 40 mg by mouth daily. [provider] Taking Active Nursing Home Medication Administration Guide (MAG)  Polyethyl Glycol-Propyl Glycol (SYSTANE) 0.4-0.3 % SOLN [id number] Yes Apply 1 drop to eye 2 (two) times daily as needed (dry eyes).  [provider] Taking Active Nursing Home Medication Administration Guide (MAG)  polyethylene glycol (MIRALAX / GLYCOLAX) 17 g packet [id number] Yes Take 17 g by mouth daily. [provider] Taking Active   potassium citrate (UROCIT-K) 10 MEQ (1080 MG) SR tablet [id number] Yes Take 10 mEq by mouth 2 (two) times daily. [provider] Taking Active Nursing Home Medication Administration Guide (MAG)  pramipexole (MIRAPEX) 0.125 MG tablet [id number] Yes Take 0.125 mg by mouth at bedtime. [provider] Taking Active   QUEtiapine (SEROQUEL) 25 MG tablet [id number] Yes Take 25 mg by mouth See admin instructions. [provider] Taking Active Nursing Home Medication Administration Guide (MAG)  QUEtiapine (SEROQUEL) 25 MG tablet [id number] Yes Take 12.5 mg by mouth every morning. [provider] Taking Active   rosuvastatin (CRESTOR) 20 MG tablet [id number] Yes Take 10 mg by mouth daily. [provider] Taking Active Nursing Home Medication Administration Guide (MAG)  saccharomyces boulardii (FLORASTOR) 250 MG capsule [id number] Yes Take 250 mg by mouth daily. [provider] Taking Active Nursing  Home Medication Administration Guide (MAG)  senna-docusate (SENOKOT-S) 8.6-50 MG tablet [id number] Yes Take 1 tablet by mouth at bedtime.  [provider] Taking Active Nursing Home Medication Administration Guide (MAG)  sertraline (ZOLOFT) 50 MG tablet [id number] Yes Take 100 mg by mouth at bedtime. [provider] Taking Active Nursing Home Medication Administration Guide (MAG)  Skin Protectants, Misc. (EUCERIN) cream [id number] Yes Apply 1 application topically daily. [provider] Taking Active Nursing Home Medication Administration Guide (MAG)  Med List Note Merceda Elks, CPhT 09/22/21 1101): [place] [phone]             SDOH:  (Social Determinants of Health) assessments and interventions performed:    Care Plan  Review of patient past medical history, allergies, medications, health status, including review of consultants reports, laboratory and other test data, was performed as part of comprehensive evaluation for care management services.   There are no care plans that you recently modified to display for this patient.    Plan: No further follow up required: this patient has been followed by skilled nursing facility care management/social worker service per her spouse [name] since August 2020   [place]. Lavina Hamman, RN, BSN, [place] Coordinator Office number [phone] [address] [facility] number [phone] Fax number [phone]

## 2022-05-31 ENCOUNTER — Encounter: Payer: Self-pay | Admitting: Adult Health

## 2022-05-31 ENCOUNTER — Non-Acute Institutional Stay (SKILLED_NURSING_FACILITY): Payer: Medicare Other | Admitting: Adult Health

## 2022-05-31 DIAGNOSIS — N390 Urinary tract infection, site not specified: Secondary | ICD-10-CM

## 2022-05-31 DIAGNOSIS — I5032 Chronic diastolic (congestive) heart failure: Secondary | ICD-10-CM

## 2022-05-31 DIAGNOSIS — F323 Major depressive disorder, single episode, severe with psychotic features: Secondary | ICD-10-CM

## 2022-05-31 DIAGNOSIS — K5909 Other constipation: Secondary | ICD-10-CM

## 2022-05-31 DIAGNOSIS — G20A1 Parkinson's disease without dyskinesia, without mention of fluctuations: Secondary | ICD-10-CM

## 2022-05-31 DIAGNOSIS — M3500 Sicca syndrome, unspecified: Secondary | ICD-10-CM

## 2022-05-31 DIAGNOSIS — G20 Parkinson's disease: Secondary | ICD-10-CM | POA: Diagnosis not present

## 2022-05-31 NOTE — Progress Notes (Unsigned)
Location:  [facility] Room Number: NO/45/A Place of Service:  SNF (31) Provider:  Durenda Age, DNP, FNP-BC  Patient Care Team: [name], MD as PCP - General (Internal Medicine) [name], MD as PCP - Cardiology (Cardiology) [name], MD (Neurosurgery) [name], MD (Neurology) [name], MD (Inactive) as Consulting Physician (Gastroenterology) [name], MD as Consulting Physician (Urology) [name], Man X, NP as Nurse Practitioner (Internal Medicine) Tat, Eustace Quail, DO as Consulting Physician (Neurology)  Extended Emergency Contact Information Primary Emergency Contact: [facility] Address: [address]          [place], [place] [address] [name] of [place] Phone: [phone] Mobile Phone: [phone] Relation: Spouse Secondary Emergency Contact: [name] Mobile Phone: [phone] Relation: Daughter Preferred language: [name] Interpreter needed? No  Code Status:  FULL  Goals of care: Advanced Directive information    05/31/2022    3:55 PM  Advanced Directives  Does Patient Have a Medical Advance Directive? Yes  Type of Advance Directive Living will;Out of facility DNR (pink MOST or yellow form)  Does patient want to make changes to medical advance directive? No - Patient declined  Copy of [place] in Chart? Yes - validated most recent copy scanned in chart (See row information)  Pre-existing out of facility DNR order (yellow form or pink MOST form) Yellow form placed in chart (order not valid for inpatient use);Pink MOST form placed in chart (order not valid for inpatient use)     Chief Complaint  Patient presents with   Acute Visit    Patient is being seen for possible uti    HPI:  Pt is a 88 y.o. female seen today for medical management of chronic diseases.  ***   Past Medical History:  Diagnosis Date   Abnormal nuclear stress test     Actinic cheilitis 04/23/2019   Anemia    iron deficiency    Anxiety    Aortic stenosis    severe by echo 2021    Arthritis    Broken arm    right    Chronic diastolic CHF (congestive heart failure) ([place])    Chronic pain    Cognitive communication deficit    Cognitive deficits    Communication   Constipation    Depression    Erosive (osteo)arthritis 03/09/2020   Family history of adverse reaction to anesthesia    pt. states sister vomits   Fibromyalgia    GERD (gastroesophageal reflux disease)    H/O hiatal hernia    Hallucinations, visual 07/21/2018   03/10/20 psych consult.    Hematuria 05/18/2020   History of bronchitis    History of kidney stones    Hypercholesterolemia    Hypertension    dr t turner   Hypothyroidism    Memory loss 04/21/2018   08/08/18 CT head no traumatic findings, atrophy.  09/27/19 MMSE 25/30, failed the clock drawing   Microhematuria 03/24/2015   Neuropathy    Osteoporosis    Parkinson disease ([place])    PVD (peripheral vascular disease) (HCC)    99% stenosis of left anteiror tibial artery, mod stenosis of left distal SFA and popliteal artery followed by [name]   RBBB    noted on EKG 2018   Restless leg syndrome    Sepsis ([place])    hx of due to [facility]   Septic shock ([place]) 04/19/2020   Shortness of breath    with exertion -  chronic    Sjogren's disease ([place])    Sjogren's syndrome ([place]) 02/04/2018   04/16/19 rheumatology: Erosive OA of hands, Sjogrens syndrome, low back pain at multiple sites, recurrent kidney stones, age related osteoporosis w/o current pathological fracture, f/u 6 months.    SOB (shortness of breath)    chronic due to diastolic dysfunction, deconditioning, obesity   Spinal stenosis    lumbar region    Tremor    Unstable gait 04/21/2018   Unsteadiness on feet    Urinary frequency 09/18/2018   Urinary tract infection    Visual hallucinations    Past Surgical History:  Procedure Laterality Date   ABDOMINAL HYSTERECTOMY      BACK SURGERY     4 back surgeries,   lumbar fusion   CARDIAC CATHETERIZATION  2006   normal   CYSTOSCOPY/URETEROSCOPY/HOLMIUM LASER/STENT PLACEMENT Left 01/06/2019   Procedure: CYSTOSCOPY/RETROGRADE/URETEROSCOPY/HOLMIUM LASER/BASKET RETRIEVAL/STENT PLACEMENT;  Surgeon: Ceasar Mons, MD;  Location: [facility];  Service: Urology;  Laterality: Left;   CYSTOSCOPY/URETEROSCOPY/HOLMIUM LASER/STENT PLACEMENT Bilateral 05/24/2020   Procedure: CYSTOSCOPY/RETROGRADE/URETEROSCOPY/HOLMIUM LASER/STENT PLACEMENT;  Surgeon: Ceasar Mons, MD;  Location: [facility];  Service: Urology;  Laterality: Bilateral;   CYSTOSCOPY/URETEROSCOPY/HOLMIUM LASER/STENT PLACEMENT Right 09/27/2021   Procedure: CYSTOSCOPY/BILATERAL RETROGRADE/URETEROSCOPY/HOLMIUM LASER/STENT PLACEMENT;  Surgeon: Ceasar Mons, MD;  Location: [facility];  Service: Urology;  Laterality: Right;  ONLY NEEDS 60 MIN   EYE SURGERY Bilateral    cateracts   falls     variious fall, broken wrist,and toes   FRACTURE SURGERY Right April 2016   Wrist, Pt. fell   IR NEPHROSTOMY PLACEMENT RIGHT  04/19/2020   kidney stone removal Left 01/20/2019   RIGHT/LEFT HEART CATH AND CORONARY ANGIOGRAPHY N/A 02/05/2018   Procedure: RIGHT/LEFT HEART CATH AND CORONARY ANGIOGRAPHY;  Surgeon: Troy Sine, MD;  Location: [facility];  Service: Cardiovascular;  Laterality: N/A;   SPINAL CORD STIMULATOR INSERTION N/A 09/09/2015   Procedure: LUMBAR SPINAL CORD STIMULATOR INSERTION;  Surgeon: Clydell Hakim, MD;  Location: [facility];  Service: Neurosurgery;  Laterality: N/A;  LUMBAR SPINAL CORD STIMULATOR INSERTION   SPINE SURGERY  April 2013   Back X's 4   TOTAL HIP ARTHROPLASTY     right   WRIST FRACTURE SURGERY Bilateral     Allergies  Allergen Reactions   Adrenalone    Codeine Other (See Comments)    Reaction:  Headaches and nightmares    Lactose Other (See Comments)    abd pain, lactose intolerant   Lactose Intolerance (Gi)  Nausea And Vomiting   Latex Rash   Lyrica [Pregabalin] Swelling and Other (See Comments)    Reaction:  Leg swelling   Morphine Other (See Comments)   Other Other (See Comments)    Pt states that pain medications give her nightmares.     Plaquenil [Hydroxychloroquine Sulfate] Other (See Comments)    Reaction:  GI upset    Reglan [Metoclopramide] Other (See Comments)    Reaction:  GI upset    Requip [Ropinirole Hcl] Other (See Comments)    Reaction:  GI upset    Septra [Sulfamethoxazole-Trimethoprim] Nausea And Vomiting   Shellfish Allergy Nausea And Vomiting   Sulfa Antibiotics Other (See Comments)    Headache, very sick    Outpatient Encounter Medications as of 05/31/2022  Medication Sig   acetaminophen (TYLENOL) 500 MG tablet Take 500 mg by mouth See admin instructions. Take 1 tablet (500 mg) by mouth scheduled 3 times daily scheduled & 3 times daily as needed for  pain (Not to exceed 3g/24 hrs)   aspirin 81 MG chewable tablet Chew 81 mg by mouth daily.   Calcium Carb-Cholecalciferol (CALCIUM 600/VITAMIN D3) 600-800 MG-UNIT TABS Take 1 tablet by mouth daily.   carbidopa-levodopa (SINEMET IR) 25-100 MG tablet Take 1 tablet by mouth 3 (three) times daily.   carbidopa-levodopa (SINEMET IR) 25-100 MG tablet Take 1 tablet by mouth at bedtime.   Cholecalciferol (VITAMIN D) 50 MCG (2000 UT) tablet Take 2,000 Units by mouth daily.   docusate sodium (COLACE) 100 MG capsule Take 100 mg by mouth at bedtime.   famotidine (PEPCID) 20 MG tablet Take 20 mg by mouth daily.   Ferrous Sulfate (SLOW FE) 142 (45 Fe) MG TBCR Take 142 mg by mouth in the morning.   furosemide (LASIX) 20 MG tablet Take 1 tablet (20 mg total) by mouth daily.   furosemide (LASIX) 20 MG tablet Take 20 mg by mouth. Give '20mg'$  PO QD PRN for lower extremity swelling.   hydroxychloroquine (PLAQUENIL) 200 MG tablet Take 200 mg by mouth daily.   lactose free nutrition (BOOST) LIQD Take by mouth 2 (two) times daily. 1/2 box; Resident  likes chocolate. Twice A Day   levothyroxine (SYNTHROID, LEVOTHROID) 50 MCG tablet Take 50 mcg by mouth daily before breakfast.    loperamide (IMODIUM A-D) 2 MG tablet Take 2 mg by mouth every 6 (six) hours as needed for diarrhea or loose stools.   lubiprostone (AMITIZA) 24 MCG capsule Take 24 mcg by mouth 2 (two) times daily with a meal.    midodrine (PROAMATINE) 2.5 MG tablet Take 1 tablet (2.5 mg total) by mouth 3 (three) times daily with meals.   Multiple Vitamin (MULTIVITAMIN WITH MINERALS) TABS tablet Take 1 tablet by mouth daily.   Multiple Vitamins-Minerals (PRESERVISION AREDS 2 PO) Take 1 tablet by mouth daily.   NYAMYC powder Apply topically.   pantoprazole (PROTONIX) 40 MG tablet Take 40 mg by mouth daily.   Polyethyl Glycol-Propyl Glycol (SYSTANE) 0.4-0.3 % SOLN Apply 1 drop to eye 2 (two) times daily as needed (dry eyes).    polyethylene glycol (MIRALAX / GLYCOLAX) 17 g packet Take 17 g by mouth daily.   potassium citrate (UROCIT-K) 10 MEQ (1080 MG) SR tablet Take 10 mEq by mouth 2 (two) times daily.   pramipexole (MIRAPEX) 0.125 MG tablet Take 0.125 mg by mouth at bedtime.   QUEtiapine (SEROQUEL) 25 MG tablet Take 25 mg by mouth See admin instructions.   QUEtiapine (SEROQUEL) 25 MG tablet Take 12.5 mg by mouth every morning.   rosuvastatin (CRESTOR) 20 MG tablet Take 10 mg by mouth daily.   saccharomyces boulardii (FLORASTOR) 250 MG capsule Take 250 mg by mouth daily.   senna-docusate (SENOKOT-S) 8.6-50 MG tablet Take 1 tablet by mouth at bedtime.    sertraline (ZOLOFT) 50 MG tablet Take 100 mg by mouth at bedtime.   Skin Protectants, Misc. (EUCERIN) cream Apply 1 application topically daily.   No facility-administered encounter medications on file as of 05/31/2022.    Review of Systems ***    Immunization History  Administered Date(s) Administered   Hepatitis A, Adult 03/01/1997, 09/20/1997   Influenza Split 09/09/2013, 09/27/2014   Influenza, High Dose Seasonal PF  08/01/2011, 08/27/2012, 10/14/2013, 09/08/2015, 09/10/2016, 09/12/2017, 09/12/2018, 09/15/2019   Influenza,inj,Quad PF,6+ Mos 09/11/2016   Influenza-Unspecified 09/21/2020, 09/28/2021   Moderna Sars-Covid-2 Vaccination 12/12/2019, 01/09/2020, 10/18/2020, 05/09/2021, 08/29/2021   Pneumococcal Conjugate-13 04/15/2014   Pneumococcal Polysaccharide-23 09/28/2005   Td 06/20/2016   Tdap 04/29/2006, 09/01/2015  Zoster Recombinat (Shingrix) 06/11/2018, 09/10/2018   Zoster, Live 04/29/2006   Pertinent  Health Maintenance Due  Topic Date Due   INFLUENZA VACCINE  07/10/2022   DEXA SCAN  Completed      08/04/2021    1:31 PM 08/09/2021   10:17 AM 09/27/2021    7:24 AM 10/10/2021   11:15 AM 03/13/2022    2:41 PM  Fall Risk  Falls in the past year? '1 1  1 '$ 0  Was there an injury with Fall? 1 1  0 0  Fall Risk Category Calculator '3 2  2 '$ 0  Fall Risk Category High Moderate  Moderate Low  Patient Fall Risk Level High fall risk High fall risk High fall risk Moderate fall risk   Patient at Risk for Falls Due to  History of fall(s)     Fall risk Follow up  Falls evaluation completed;Education provided        Vitals:   05/31/22 1558  BP: (!) 114/96  Pulse: 86  Resp: 18  Temp: (!) 97.5 F (36.4 C)  SpO2: 97%  Weight: 132 lb 4.8 oz (60 kg)  Height: '4\' 8"'$  (1.422 m)   Body mass index is 29.66 kg/m.  Physical Exam     Labs reviewed: Recent Labs    09/27/21 0720 01/11/22 0000 03/31/22 0000  NA 140 141 140  K 4.3 4.4 4.6  CL 108 106 108  CO2 24 24* 23*  GLUCOSE 90  --   --   BUN '14 20 20  '$ CREATININE 0.85 1.0 1.0  CALCIUM 9.4 9.5 9.4   Recent Labs    01/11/22 0000 03/31/22 0000  AST  --  18  ALT  --  12  ALKPHOS  --  76  ALBUMIN 3.8 3.6   Recent Labs    09/27/21 0720 09/27/21 0720 10/05/21 0000 10/09/21 0000 11/23/21 0000 03/31/22 0000  WBC 5.0   < > 5.1 5.4 6.9 8.8  NEUTROABS  --   --  2,739.00 2,538.00  --  5,342.00  HGB 8.9*  --  8.0* 8.0* 10.1* 9.6*  HCT 29.4*   --  26* 26* 31* 30*  MCV 98.3  --   --   --   --   --   PLT 307  --  253 281 268 248   < > = values in this interval not displayed.   Lab Results  Component Value Date   TSH 1.67 01/11/2022   No results found for: "HGBA1C" Lab Results  Component Value Date   CHOL 137 01/11/2022   HDL 47 01/11/2022   LDLCALC 64 01/11/2022   TRIG 184 (A) 01/11/2022   CHOLHDL 2.4 07/14/2019    Significant Diagnostic Results in last 30 days:  No results found.  Assessment/Plan ***   Family/ staff Communication: Discussed plan of care with resident and charge nurse  Labs/tests ordered:     Durenda Age, DNP, MSN, FNP-BC [facility] [phone] (Monday-Friday 8:00 a.m. - 5:00 p.m.) [phone] (after hours)

## 2022-06-01 DIAGNOSIS — K219 Gastro-esophageal reflux disease without esophagitis: Secondary | ICD-10-CM | POA: Diagnosis not present

## 2022-06-01 DIAGNOSIS — M6281 Muscle weakness (generalized): Secondary | ICD-10-CM | POA: Diagnosis not present

## 2022-06-01 DIAGNOSIS — R0602 Shortness of breath: Secondary | ICD-10-CM | POA: Diagnosis not present

## 2022-06-01 DIAGNOSIS — G20 Parkinson's disease: Secondary | ICD-10-CM | POA: Diagnosis not present

## 2022-06-01 DIAGNOSIS — R498 Other voice and resonance disorders: Secondary | ICD-10-CM | POA: Diagnosis not present

## 2022-06-01 DIAGNOSIS — R2681 Unsteadiness on feet: Secondary | ICD-10-CM | POA: Diagnosis not present

## 2022-06-04 DIAGNOSIS — Z79899 Other long term (current) drug therapy: Secondary | ICD-10-CM | POA: Diagnosis not present

## 2022-06-04 DIAGNOSIS — M154 Erosive (osteo)arthritis: Secondary | ICD-10-CM | POA: Diagnosis not present

## 2022-06-04 DIAGNOSIS — M797 Fibromyalgia: Secondary | ICD-10-CM | POA: Diagnosis not present

## 2022-06-04 DIAGNOSIS — M3500 Sicca syndrome, unspecified: Secondary | ICD-10-CM | POA: Diagnosis not present

## 2022-06-05 ENCOUNTER — Non-Acute Institutional Stay (SKILLED_NURSING_FACILITY): Payer: Medicare Other | Admitting: Internal Medicine

## 2022-06-05 ENCOUNTER — Encounter: Payer: Self-pay | Admitting: Internal Medicine

## 2022-06-05 DIAGNOSIS — I350 Nonrheumatic aortic (valve) stenosis: Secondary | ICD-10-CM

## 2022-06-05 DIAGNOSIS — N390 Urinary tract infection, site not specified: Secondary | ICD-10-CM

## 2022-06-05 DIAGNOSIS — R498 Other voice and resonance disorders: Secondary | ICD-10-CM | POA: Diagnosis not present

## 2022-06-05 DIAGNOSIS — E7800 Pure hypercholesterolemia, unspecified: Secondary | ICD-10-CM | POA: Diagnosis not present

## 2022-06-05 DIAGNOSIS — G2581 Restless legs syndrome: Secondary | ICD-10-CM

## 2022-06-05 DIAGNOSIS — E039 Hypothyroidism, unspecified: Secondary | ICD-10-CM | POA: Diagnosis not present

## 2022-06-05 DIAGNOSIS — L814 Other melanin hyperpigmentation: Secondary | ICD-10-CM | POA: Diagnosis not present

## 2022-06-05 DIAGNOSIS — F323 Major depressive disorder, single episode, severe with psychotic features: Secondary | ICD-10-CM | POA: Diagnosis not present

## 2022-06-05 DIAGNOSIS — G20 Parkinson's disease: Secondary | ICD-10-CM

## 2022-06-05 DIAGNOSIS — D508 Other iron deficiency anemias: Secondary | ICD-10-CM | POA: Diagnosis not present

## 2022-06-05 DIAGNOSIS — K5909 Other constipation: Secondary | ICD-10-CM

## 2022-06-05 DIAGNOSIS — M154 Erosive (osteo)arthritis: Secondary | ICD-10-CM | POA: Diagnosis not present

## 2022-06-05 DIAGNOSIS — K219 Gastro-esophageal reflux disease without esophagitis: Secondary | ICD-10-CM | POA: Diagnosis not present

## 2022-06-05 DIAGNOSIS — R0602 Shortness of breath: Secondary | ICD-10-CM | POA: Diagnosis not present

## 2022-06-05 DIAGNOSIS — I5032 Chronic diastolic (congestive) heart failure: Secondary | ICD-10-CM | POA: Diagnosis not present

## 2022-06-05 DIAGNOSIS — Z85068 Personal history of other malignant neoplasm of small intestine: Secondary | ICD-10-CM | POA: Diagnosis not present

## 2022-06-05 DIAGNOSIS — L570 Actinic keratosis: Secondary | ICD-10-CM | POA: Diagnosis not present

## 2022-06-05 DIAGNOSIS — L821 Other seborrheic keratosis: Secondary | ICD-10-CM | POA: Diagnosis not present

## 2022-06-05 DIAGNOSIS — G20A1 Parkinson's disease without dyskinesia, without mention of fluctuations: Secondary | ICD-10-CM

## 2022-06-05 DIAGNOSIS — R2681 Unsteadiness on feet: Secondary | ICD-10-CM | POA: Diagnosis not present

## 2022-06-05 DIAGNOSIS — M6281 Muscle weakness (generalized): Secondary | ICD-10-CM | POA: Diagnosis not present

## 2022-06-05 DIAGNOSIS — Z7189 Other specified counseling: Secondary | ICD-10-CM

## 2022-06-05 NOTE — Progress Notes (Signed)
Location:   [facility] Room Number: 45 Place of Service:  SNF [phone]) Provider:  [name] MD   [name], MD  Patient Care Team: [name], MD as PCP - General (Internal Medicine) [name], MD as PCP - Cardiology (Cardiology) [name], MD (Neurosurgery) [name], MD (Neurology) [name], MD (Inactive) as Consulting Physician (Gastroenterology) [name], MD as Consulting Physician (Urology) [name], Man X, NP as Nurse Practitioner (Internal Medicine) Tat, Eustace Quail, DO as Consulting Physician (Neurology)  Extended Emergency Contact Information Primary Emergency Contact: [facility] Address: [address]          [place], [place] [address] [name] of [place] Phone: [phone] Mobile Phone: [phone] Relation: Spouse Secondary Emergency Contact: [name] Mobile Phone: [phone] Relation: Daughter Preferred language: [name] Interpreter needed? No  Code Status:  Full Code Goals of care: Advanced Directive information    06/05/2022   10:27 AM  Advanced Directives  Does Patient Have a Medical Advance Directive? Yes  Type of Advance Directive Living will;Out of facility DNR (pink MOST or yellow form)  Does patient want to make changes to medical advance directive? No - Patient declined  Copy of [place] in Chart? Yes - validated most recent copy scanned in chart (See row information)  Pre-existing out of facility DNR order (yellow form or pink MOST form) Yellow form placed in chart (order not valid for inpatient use);Pink MOST form placed in chart (order not valid for inpatient use)     Chief Complaint  Patient presents with   Acute Visit    HPI:  Pt is a 88 y.o. female seen today for an acute visit for Husband Request discussion about her Prognosis  Lives in SNF  Patient has  History of renal stones with recurrent UTI  Underwent Cystoscopy  with right ureteroscopy, holmium laser lithotripsy and right ureteral stent placement on 10/22 Doing well. Has h/o Pseudomonas and has seen ID    History of erosive arthritis on Plaquenil and follows with Dr. Lenna Gilford   Parkinson disease with hallucinations.  Failed taper of Sinemet. Back on it now Also failed taper of Mirapex On Lower dose now   Severe AS refused surgery per cardiology palliative approach   Also has h/o Peripheral Neuropathy, hypothyroid, GERD, osteoporosis, hyperlipidemia, gout, Severe Constipation with Rectal prolapse   Patient continues to have issues with shortness of breath when she tries to walk with therapy.  She gets weak has to stop. Per husband cognition is also getting little worse. Also having some problems with hallucinations mostly in the evening. Patient states that she sees sometimes her sister sometimes children but they do not bother her.  Mr. Patricelli said that  she sometimes calls him but this is very rare Patient did have recent worsening of her cognition but was diagnosed with UTI and her mental status is much better now She continues to need help with her transfers and her ADLs     No new Nursing issues. No Behavior issues Her weight is stable Walks with her walker with Therapy but very low endurance No Falls     Past Medical History:  Diagnosis Date   Abnormal nuclear stress test    Actinic cheilitis 04/23/2019   Anemia    iron deficiency    Anxiety    Aortic stenosis    severe by echo 2021    Arthritis    Broken arm  right    Chronic diastolic CHF (congestive heart failure) (HCC)    Chronic pain    Cognitive communication deficit    Cognitive deficits    Communication   Constipation    Depression    Erosive (osteo)arthritis 03/09/2020   Family history of adverse reaction to anesthesia    pt. states sister vomits   Fibromyalgia    GERD (gastroesophageal reflux disease)    H/O hiatal hernia    Hallucinations, visual  07/21/2018   03/10/20 psych consult.    Hematuria 05/18/2020   History of bronchitis    History of kidney stones    Hypercholesterolemia    Hypertension    dr t turner   Hypothyroidism    Memory loss 04/21/2018   08/08/18 CT head no traumatic findings, atrophy.  09/27/19 MMSE 25/30, failed the clock drawing   Microhematuria 03/24/2015   Neuropathy    Osteoporosis    Parkinson disease (HCC)    PVD (peripheral vascular disease) (HCC)    99% stenosis of left anteiror tibial artery, mod stenosis of left distal SFA and popliteal artery followed by [name]   RBBB    noted on EKG 2018   Restless leg syndrome    Sepsis ([place])    hx of due to Ecoli   Septic shock ([place]) 04/19/2020   Shortness of breath    with exertion - chronic    Sjogren's disease ([place])    Sjogren's syndrome ([place]) 02/04/2018   04/16/19 rheumatology: Erosive OA of hands, Sjogrens syndrome, low back pain at multiple sites, recurrent kidney stones, age related osteoporosis w/o current pathological fracture, f/u 6 months.    SOB (shortness of breath)    chronic due to diastolic dysfunction, deconditioning, obesity   Spinal stenosis    lumbar region    Tremor    Unstable gait 04/21/2018   Unsteadiness on feet    Urinary frequency 09/18/2018   Urinary tract infection    Visual hallucinations    Past Surgical History:  Procedure Laterality Date   ABDOMINAL HYSTERECTOMY     BACK SURGERY     4 back surgeries,   lumbar fusion   CARDIAC CATHETERIZATION  2006   normal   CYSTOSCOPY/URETEROSCOPY/HOLMIUM LASER/STENT PLACEMENT Left 01/06/2019   Procedure: CYSTOSCOPY/RETROGRADE/URETEROSCOPY/HOLMIUM LASER/BASKET RETRIEVAL/STENT PLACEMENT;  Surgeon: Ceasar Mons, MD;  Location: [facility];  Service: Urology;  Laterality: Left;   CYSTOSCOPY/URETEROSCOPY/HOLMIUM LASER/STENT PLACEMENT Bilateral 05/24/2020   Procedure: CYSTOSCOPY/RETROGRADE/URETEROSCOPY/HOLMIUM LASER/STENT PLACEMENT;  Surgeon: Ceasar Mons, MD;   Location: [facility];  Service: Urology;  Laterality: Bilateral;   CYSTOSCOPY/URETEROSCOPY/HOLMIUM LASER/STENT PLACEMENT Right 09/27/2021   Procedure: CYSTOSCOPY/BILATERAL RETROGRADE/URETEROSCOPY/HOLMIUM LASER/STENT PLACEMENT;  Surgeon: Ceasar Mons, MD;  Location: [facility];  Service: Urology;  Laterality: Right;  ONLY NEEDS 60 MIN   EYE SURGERY Bilateral    cateracts   falls     variious fall, broken wrist,and toes   FRACTURE SURGERY Right April 2016   Wrist, Pt. fell   IR NEPHROSTOMY PLACEMENT RIGHT  04/19/2020   kidney stone removal Left 01/20/2019   RIGHT/LEFT HEART CATH AND CORONARY ANGIOGRAPHY N/A 02/05/2018   Procedure: RIGHT/LEFT HEART CATH AND CORONARY ANGIOGRAPHY;  Surgeon: Troy Sine, MD;  Location: [facility];  Service: Cardiovascular;  Laterality: N/A;   SPINAL CORD STIMULATOR INSERTION N/A 09/09/2015   Procedure: LUMBAR SPINAL CORD STIMULATOR INSERTION;  Surgeon: Clydell Hakim, MD;  Location: [facility];  Service: Neurosurgery;  Laterality: N/A;  LUMBAR SPINAL CORD STIMULATOR INSERTION   SPINE SURGERY  April 2013   Back X's 4   TOTAL HIP ARTHROPLASTY     right   WRIST FRACTURE SURGERY Bilateral     Allergies  Allergen Reactions   Adrenalone    Codeine Other (See Comments)    Reaction:  Headaches and nightmares    Lactose Other (See Comments)    abd pain, lactose intolerant   Lactose Intolerance (Gi) Nausea And Vomiting   Latex Rash   Lyrica [Pregabalin] Swelling and Other (See Comments)    Reaction:  Leg swelling   Morphine Other (See Comments)   Other Other (See Comments)    Pt states that pain medications give her nightmares.     Plaquenil [Hydroxychloroquine Sulfate] Other (See Comments)    Reaction:  GI upset    Reglan [Metoclopramide] Other (See Comments)    Reaction:  GI upset    Requip [Ropinirole Hcl] Other (See Comments)    Reaction:  GI upset    Septra [Sulfamethoxazole-Trimethoprim] Nausea And Vomiting   Shellfish Allergy Nausea  And Vomiting   Sulfa Antibiotics Other (See Comments)    Headache, very sick    Allergies as of 06/05/2022       Reactions   Adrenalone    Codeine Other (See Comments)   Reaction:  Headaches and nightmares    Lactose Other (See Comments)   abd pain, lactose intolerant   Lactose Intolerance (gi) Nausea And Vomiting   Latex Rash   Lyrica [pregabalin] Swelling, Other (See Comments)   Reaction:  Leg swelling   Morphine Other (See Comments)   Other Other (See Comments)   Pt states that pain medications give her nightmares.     Plaquenil [hydroxychloroquine Sulfate] Other (See Comments)   Reaction:  GI upset    Reglan [metoclopramide] Other (See Comments)   Reaction:  GI upset    Requip [ropinirole Hcl] Other (See Comments)   Reaction:  GI upset    Septra [sulfamethoxazole-trimethoprim] Nausea And Vomiting   Shellfish Allergy Nausea And Vomiting   Sulfa Antibiotics Other (See Comments)   Headache, very sick        Medication List        Accurate as of June 05, 2022 10:27 AM. If you have any questions, ask your nurse or doctor.          acetaminophen 500 MG tablet Commonly known as: TYLENOL Take 500 mg by mouth See admin instructions. Take 1 tablet (500 mg) by mouth scheduled 3 times daily scheduled & 3 times daily as needed for pain (Not to exceed 3g/24 hrs)   aspirin 81 MG chewable tablet Chew 81 mg by mouth daily.   Calcium 600/Vitamin D3 600-20 MG-MCG Tabs Generic drug: Calcium Carb-Cholecalciferol Take 1 tablet by mouth daily.   carbidopa-levodopa 25-100 MG tablet Commonly known as: SINEMET IR Take 1 tablet by mouth 3 (three) times daily.   carbidopa-levodopa 25-100 MG tablet Commonly known as: SINEMET IR Take 1 tablet by mouth at bedtime.   ciprofloxacin 500 MG tablet Commonly known as: CIPRO Take 500 mg by mouth 2 (two) times daily.   docusate sodium 100 MG capsule Commonly known as: COLACE Take 100 mg by mouth at bedtime.   eucerin cream Apply 1  application topically daily.   famotidine 20 MG tablet Commonly known as: PEPCID Take 20 mg by mouth daily.   furosemide 20 MG tablet Commonly known as: LASIX Take 20 mg by mouth. Give '20mg'$  PO QD PRN for lower extremity swelling.   furosemide 20 MG  tablet Commonly known as: LASIX Take 1 tablet (20 mg total) by mouth daily.   hydroxychloroquine 200 MG tablet Commonly known as: PLAQUENIL Take 200 mg by mouth daily.   lactose free nutrition Liqd Take by mouth 2 (two) times daily. 1/2 box; Resident likes chocolate. Twice A Day   levothyroxine 50 MCG tablet Commonly known as: SYNTHROID Take 50 mcg by mouth daily before breakfast.   loperamide 2 MG tablet Commonly known as: IMODIUM A-D Take 2 mg by mouth every 6 (six) hours as needed for diarrhea or loose stools.   lubiprostone 24 MCG capsule Commonly known as: AMITIZA Take 24 mcg by mouth 2 (two) times daily with a meal.   midodrine 2.5 MG tablet Commonly known as: PROAMATINE Take 1 tablet (2.5 mg total) by mouth 3 (three) times daily with meals.   multivitamin with minerals Tabs tablet Take 1 tablet by mouth daily.   Nyamyc powder Generic drug: nystatin Apply topically.   pantoprazole 40 MG tablet Commonly known as: PROTONIX Take 40 mg by mouth daily.   polyethylene glycol 17 g packet Commonly known as: MIRALAX / GLYCOLAX Take 17 g by mouth daily.   potassium citrate 10 MEQ (1080 MG) SR tablet Commonly known as: UROCIT-K Take 10 mEq by mouth 2 (two) times daily.   pramipexole 0.125 MG tablet Commonly known as: MIRAPEX Take 0.125 mg by mouth at bedtime.   PRESERVISION AREDS 2 PO Take 1 tablet by mouth daily.   QUEtiapine 25 MG tablet Commonly known as: SEROQUEL Take 25 mg by mouth See admin instructions.   QUEtiapine 25 MG tablet Commonly known as: SEROQUEL Take 12.5 mg by mouth every morning.   rosuvastatin 20 MG tablet Commonly known as: CRESTOR Take 10 mg by mouth daily.   saccharomyces  boulardii 250 MG capsule Commonly known as: FLORASTOR Take 250 mg by mouth daily.   senna-docusate 8.6-50 MG tablet Commonly known as: Senokot-S Take 1 tablet by mouth at bedtime.   sertraline 50 MG tablet Commonly known as: ZOLOFT Take 100 mg by mouth at bedtime.   Slow Fe 142 (45 Fe) MG Tbcr Generic drug: Ferrous Sulfate Take 142 mg by mouth in the morning.   Systane 0.4-0.3 % Soln Generic drug: Polyethyl Glycol-Propyl Glycol Apply 1 drop to eye 2 (two) times daily as needed (dry eyes).   Vitamin D 50 MCG (2000 UT) tablet Take 2,000 Units by mouth daily.        Review of Systems  Constitutional:  Positive for activity change. Negative for appetite change.  HENT: Negative.    Respiratory:  Positive for shortness of breath. Negative for cough.   Cardiovascular:  Negative for leg swelling.  Gastrointestinal:  Negative for constipation.  Genitourinary: Negative.   Musculoskeletal:  Positive for gait problem. Negative for arthralgias and myalgias.  Skin: Negative.   Neurological:  Positive for weakness. Negative for dizziness.  Psychiatric/Behavioral:  Positive for hallucinations. Negative for confusion, dysphoric mood and sleep disturbance.     Immunization History  Administered Date(s) Administered   Hepatitis A, Adult 03/01/1997, 09/20/1997   Influenza Split 09/09/2013, 09/27/2014   Influenza, High Dose Seasonal PF 08/01/2011, 08/27/2012, 10/14/2013, 09/08/2015, 09/10/2016, 09/12/2017, 09/12/2018, 09/15/2019   Influenza,inj,Quad PF,6+ Mos 09/11/2016   Influenza-Unspecified 09/21/2020, 09/28/2021   Moderna Sars-Covid-2 Vaccination 12/12/2019, 01/09/2020, 10/18/2020, 05/09/2021, 08/29/2021   Pneumococcal Conjugate-13 04/15/2014   Pneumococcal Polysaccharide-23 09/28/2005   Td 06/20/2016   Tdap 04/29/2006, 09/01/2015   Zoster Recombinat (Shingrix) 06/11/2018, 09/10/2018   Zoster, Live 04/29/2006  Pertinent  Health Maintenance Due  Topic Date Due   INFLUENZA  VACCINE  07/10/2022   DEXA SCAN  Completed      08/04/2021    1:31 PM 08/09/2021   10:17 AM 09/27/2021    7:24 AM 10/10/2021   11:15 AM 03/13/2022    2:41 PM  Fall Risk  Falls in the past year? '1 1  1 '$ 0  Was there an injury with Fall? 1 1  0 0  Fall Risk Category Calculator '3 2  2 '$ 0  Fall Risk Category High Moderate  Moderate Low  Patient Fall Risk Level High fall risk High fall risk High fall risk Moderate fall risk   Patient at Risk for Falls Due to  History of fall(s)     Fall risk Follow up  Falls evaluation completed;Education provided      Functional Status Survey:    Vitals:   06/05/22 1017  BP: (!) 114/96  Pulse: 86  Resp: 18  Temp: (!) 97.3 F (36.3 C)  SpO2: 96%  Weight: 132 lb 6.4 oz (60.1 kg)  Height: '4\' 8"'$  (1.422 m)   Body mass index is 29.68 kg/m. Physical Exam Vitals reviewed.  Constitutional:      Appearance: Normal appearance.  HENT:     Head: Normocephalic.     Nose: Nose normal.     Mouth/Throat:     Mouth: Mucous membranes are moist.     Pharynx: Oropharynx is clear.  Eyes:     Pupils: Pupils are equal, round, and reactive to light.  Cardiovascular:     Rate and Rhythm: Normal rate and regular rhythm.     Pulses: Normal pulses.     Heart sounds: Murmur heard.  Pulmonary:     Effort: Pulmonary effort is normal.     Breath sounds: Normal breath sounds.  Abdominal:     General: Abdomen is flat. Bowel sounds are normal.     Palpations: Abdomen is soft.  Musculoskeletal:        General: No swelling.     Cervical back: Neck supple.  Skin:    General: Skin is warm.  Neurological:     General: No focal deficit present.     Mental Status: She is alert and oriented to person, place, and time.  Psychiatric:        Mood and Affect: Mood normal.        Thought Content: Thought content normal.     Labs reviewed: Recent Labs    09/27/21 0720 01/11/22 0000 03/31/22 0000  NA 140 141 140  K 4.3 4.4 4.6  CL 108 106 108  CO2 24 24* 23*   GLUCOSE 90  --   --   BUN '14 20 20  '$ CREATININE 0.85 1.0 1.0  CALCIUM 9.4 9.5 9.4   Recent Labs    01/11/22 0000 03/31/22 0000  AST  --  18  ALT  --  12  ALKPHOS  --  76  ALBUMIN 3.8 3.6   Recent Labs    09/27/21 0720 09/27/21 0720 10/05/21 0000 10/09/21 0000 11/23/21 0000 03/31/22 0000  WBC 5.0   < > 5.1 5.4 6.9 8.8  NEUTROABS  --   --  2,739.00 2,538.00  --  5,342.00  HGB 8.9*  --  8.0* 8.0* 10.1* 9.6*  HCT 29.4*  --  26* 26* 31* 30*  MCV 98.3  --   --   --   --   --   PLT 307  --  253 281 268 248   < > = values in this interval not displayed.   Lab Results  Component Value Date   TSH 1.67 01/11/2022   No results found for: "HGBA1C" Lab Results  Component Value Date   CHOL 137 01/11/2022   HDL 47 01/11/2022   LDLCALC 64 01/11/2022   TRIG 184 (A) 01/11/2022   CHOLHDL 2.4 07/14/2019    Significant Diagnostic Results in last 30 days:  No results found.  Assessment/Plan 1. Parkinson's disease ([place]) On Sinemet Continues to need help with her ADLS Follows with Dr TAt 2. Depression, psychotic ([place]) On Seroquel  Discussed with husband  Would not change the dose  Hallucinations don't bother her Will change the time to 6 pm for evening dose as that when they start and Nurses bring it late On Zoloft 3. Recurrent UTI On Cipro right now Follows with Urology Palliative approach  4. Chronic diastolic CHF (congestive heart failure) (HCC) On Low dose of Lasix  5. Chronic constipation Amitiza  6. Erosive (osteo)arthritis On Plaquenil Per Dr Milly Jakob  7. Restless leg syndrome Mirapex  8. Acquired hypothyroidism TSH normal in 02/23  9. Iron deficiency anemia secondary to inadequate dietary iron intake Continue Iron HGB stable  10. Hypercholesterolemia On Crestor LDL 64 02/23  11. Gastroesophageal reflux disease, unspecified whether esophagitis present On Protonix  12. Nonrheumatic aortic valve stenosis Decided Palliative approach Her symptoms are  related to her AS Also on Midodrine per cardiology  13. Goals of care, counseling/discussion D/W Husband she is now going to be DNR and Limited Interventions     Family/ staff Communication:   Labs/tests ordered:

## 2022-06-06 DIAGNOSIS — M6281 Muscle weakness (generalized): Secondary | ICD-10-CM | POA: Diagnosis not present

## 2022-06-06 DIAGNOSIS — K219 Gastro-esophageal reflux disease without esophagitis: Secondary | ICD-10-CM | POA: Diagnosis not present

## 2022-06-06 DIAGNOSIS — R0602 Shortness of breath: Secondary | ICD-10-CM | POA: Diagnosis not present

## 2022-06-06 DIAGNOSIS — R498 Other voice and resonance disorders: Secondary | ICD-10-CM | POA: Diagnosis not present

## 2022-06-06 DIAGNOSIS — G20 Parkinson's disease: Secondary | ICD-10-CM | POA: Diagnosis not present

## 2022-06-06 DIAGNOSIS — R2681 Unsteadiness on feet: Secondary | ICD-10-CM | POA: Diagnosis not present

## 2022-06-08 DIAGNOSIS — K219 Gastro-esophageal reflux disease without esophagitis: Secondary | ICD-10-CM | POA: Diagnosis not present

## 2022-06-08 DIAGNOSIS — R498 Other voice and resonance disorders: Secondary | ICD-10-CM | POA: Diagnosis not present

## 2022-06-08 DIAGNOSIS — M6281 Muscle weakness (generalized): Secondary | ICD-10-CM | POA: Diagnosis not present

## 2022-06-08 DIAGNOSIS — R2681 Unsteadiness on feet: Secondary | ICD-10-CM | POA: Diagnosis not present

## 2022-06-08 DIAGNOSIS — G20 Parkinson's disease: Secondary | ICD-10-CM | POA: Diagnosis not present

## 2022-06-08 DIAGNOSIS — R0602 Shortness of breath: Secondary | ICD-10-CM | POA: Diagnosis not present

## 2022-06-12 DIAGNOSIS — G20 Parkinson's disease: Secondary | ICD-10-CM | POA: Diagnosis not present

## 2022-06-12 DIAGNOSIS — M6281 Muscle weakness (generalized): Secondary | ICD-10-CM | POA: Diagnosis not present

## 2022-06-12 DIAGNOSIS — R278 Other lack of coordination: Secondary | ICD-10-CM | POA: Diagnosis not present

## 2022-06-12 DIAGNOSIS — R2681 Unsteadiness on feet: Secondary | ICD-10-CM | POA: Diagnosis not present

## 2022-06-12 DIAGNOSIS — R29898 Other symptoms and signs involving the musculoskeletal system: Secondary | ICD-10-CM | POA: Diagnosis not present

## 2022-06-13 DIAGNOSIS — R2681 Unsteadiness on feet: Secondary | ICD-10-CM | POA: Diagnosis not present

## 2022-06-13 DIAGNOSIS — R29898 Other symptoms and signs involving the musculoskeletal system: Secondary | ICD-10-CM | POA: Diagnosis not present

## 2022-06-13 DIAGNOSIS — M6281 Muscle weakness (generalized): Secondary | ICD-10-CM | POA: Diagnosis not present

## 2022-06-13 DIAGNOSIS — G20 Parkinson's disease: Secondary | ICD-10-CM | POA: Diagnosis not present

## 2022-06-13 DIAGNOSIS — R278 Other lack of coordination: Secondary | ICD-10-CM | POA: Diagnosis not present

## 2022-06-14 DIAGNOSIS — G20 Parkinson's disease: Secondary | ICD-10-CM | POA: Diagnosis not present

## 2022-06-14 DIAGNOSIS — R29898 Other symptoms and signs involving the musculoskeletal system: Secondary | ICD-10-CM | POA: Diagnosis not present

## 2022-06-14 DIAGNOSIS — M6281 Muscle weakness (generalized): Secondary | ICD-10-CM | POA: Diagnosis not present

## 2022-06-14 DIAGNOSIS — R278 Other lack of coordination: Secondary | ICD-10-CM | POA: Diagnosis not present

## 2022-06-14 DIAGNOSIS — R2681 Unsteadiness on feet: Secondary | ICD-10-CM | POA: Diagnosis not present

## 2022-06-17 ENCOUNTER — Encounter: Payer: Self-pay | Admitting: Physician Assistant

## 2022-06-17 NOTE — Progress Notes (Deleted)
Cardiology Clinic Note   Patient Name: LUCIEL BRICKMAN Date of Encounter: 06/17/2022  Primary Care Provider:  [name], MD Primary Cardiologist:  Fransico Him, MD  Patient Profile    [name] is a 88 y.o. female who presents to the clinic today for a 4 week follow-up regarding her blood pressure.   Past Medical History    Past Medical History:  Diagnosis Date   Abnormal nuclear stress test    Actinic cheilitis 04/23/2019   Anemia    iron deficiency    Anxiety    Aortic stenosis    severe by echo 2021    Arthritis    Broken arm    right    Chronic diastolic CHF (congestive heart failure) (HCC)    Chronic pain    Cognitive communication deficit    Cognitive deficits    Communication   Constipation    Depression    Erosive (osteo)arthritis 03/09/2020   Family history of adverse reaction to anesthesia    pt. states sister vomits   Fibromyalgia    GERD (gastroesophageal reflux disease)    H/O hiatal hernia    Hallucinations, visual 07/21/2018   03/10/20 psych consult.    Hematuria 05/18/2020   History of bronchitis    History of kidney stones    Hypercholesterolemia    Hypertension    dr t turner   Hypothyroidism    Memory loss 04/21/2018   08/08/18 CT head no traumatic findings, atrophy.  09/27/19 MMSE 25/30, failed the clock drawing   Microhematuria 03/24/2015   Mild CAD    Neuropathy    Osteoporosis    Parkinson disease (HCC)    PVD (peripheral vascular disease) (HCC)    99% stenosis of left anteiror tibial artery, mod stenosis of left distal SFA and popliteal artery followed by [name]   RBBB    noted on EKG 2018   Restless leg syndrome    Sepsis ([place])    hx of due to Ecoli   Septic shock ([place]) 04/19/2020   Shortness of breath    with exertion - chronic    Sjogren's disease ([place])    Sjogren's syndrome ([place]) 02/04/2018   04/16/19 rheumatology: Erosive OA of hands, Sjogrens syndrome, low back pain at multiple sites, recurrent  kidney stones, age related osteoporosis w/o current pathological fracture, f/u 6 months.    SOB (shortness of breath)    chronic due to diastolic dysfunction, deconditioning, obesity   Spinal stenosis    lumbar region    Tremor    Unstable gait 04/21/2018   Unsteadiness on feet    Urinary frequency 09/18/2018   Urinary tract infection    Visual hallucinations    Past Surgical History:  Procedure Laterality Date   ABDOMINAL HYSTERECTOMY     BACK SURGERY     4 back surgeries,   lumbar fusion   CARDIAC CATHETERIZATION  2006   normal   CYSTOSCOPY/URETEROSCOPY/HOLMIUM LASER/STENT PLACEMENT Left 01/06/2019   Procedure: CYSTOSCOPY/RETROGRADE/URETEROSCOPY/HOLMIUM LASER/BASKET RETRIEVAL/STENT PLACEMENT;  Surgeon: Ceasar Mons, MD;  Location: [facility];  Service: Urology;  Laterality: Left;   CYSTOSCOPY/URETEROSCOPY/HOLMIUM LASER/STENT PLACEMENT Bilateral 05/24/2020   Procedure: CYSTOSCOPY/RETROGRADE/URETEROSCOPY/HOLMIUM LASER/STENT PLACEMENT;  Surgeon: Ceasar Mons, MD;  Location: [facility];  Service: Urology;  Laterality: Bilateral;   CYSTOSCOPY/URETEROSCOPY/HOLMIUM LASER/STENT PLACEMENT Right 09/27/2021   Procedure: CYSTOSCOPY/BILATERAL RETROGRADE/URETEROSCOPY/HOLMIUM LASER/STENT PLACEMENT;  Surgeon: Ceasar Mons, MD;  Location: [facility];  Service: Urology;  Laterality: Right;  ONLY NEEDS 60 MIN  EYE SURGERY Bilateral    cateracts   falls     variious fall, broken wrist,and toes   FRACTURE SURGERY Right April 2016   Wrist, Pt. fell   IR NEPHROSTOMY PLACEMENT RIGHT  04/19/2020   kidney stone removal Left 01/20/2019   RIGHT/LEFT HEART CATH AND CORONARY ANGIOGRAPHY N/A 02/05/2018   Procedure: RIGHT/LEFT HEART CATH AND CORONARY ANGIOGRAPHY;  Surgeon: Troy Sine, MD;  Location: [facility];  Service: Cardiovascular;  Laterality: N/A;   SPINAL CORD STIMULATOR INSERTION N/A 09/09/2015   Procedure: LUMBAR SPINAL CORD STIMULATOR INSERTION;  Surgeon: Clydell Hakim, MD;  Location: [facility];  Service: Neurosurgery;  Laterality: N/A;  LUMBAR SPINAL CORD STIMULATOR INSERTION   SPINE SURGERY  April 2013   Back X's 4   TOTAL HIP ARTHROPLASTY     right   WRIST FRACTURE SURGERY Bilateral     Allergies  Allergies  Allergen Reactions   Adrenalone    Codeine Other (See Comments)    Reaction:  Headaches and nightmares    Lactose Other (See Comments)    abd pain, lactose intolerant   Lactose Intolerance (Gi) Nausea And Vomiting   Latex Rash   Lyrica [Pregabalin] Swelling and Other (See Comments)    Reaction:  Leg swelling   Morphine Other (See Comments)   Other Other (See Comments)    Pt states that pain medications give her nightmares.     Plaquenil [Hydroxychloroquine Sulfate] Other (See Comments)    Reaction:  GI upset    Reglan [Metoclopramide] Other (See Comments)    Reaction:  GI upset    Requip [Ropinirole Hcl] Other (See Comments)    Reaction:  GI upset    Septra [Sulfamethoxazole-Trimethoprim] Nausea And Vomiting   Shellfish Allergy Nausea And Vomiting   Sulfa Antibiotics Other (See Comments)    Headache, very sick    History of Present Illness    Akera Snowberger is a 88 y.o. female with the above problem list. She also has a PMH of chronic diastolic CHF, chronic SHOB secondary to diastolic dysfunction, obesity, hypertension, chronic LE edema, PVD (followed by [name]) and deconditioning. She also has a history of severe AS and she was evaluated by the Structural Heart Team and was not considered to be a candidate for a TAVR and they recommended a Palliative Care approach instead. She also follows Rheumatology, Neurology, Urology, Internal Medicine, and GI. She lives at the [facility].  She was last seen in the Cardiology office by Dr. Radford Pax on 05/21/2022. At this visit, she was found to be doing well. She denied any cardiac complaints, was compliant with her medication and was tolerating her cardiac medications  well. She was found to be euvolemic on exam; however, due to being sedentary she had been experiencing chronic LE edema by the end of the day. It was found that her chronic shortness of breath was suspected to be due to her severe AS and her sedentary state and not due to volume overload since a previous BNP that was checked was found to be normal. She was encouraged to wear compression stockings during the day to help with this. Dr Radford Pax countinued her Lasix 20 mg daily regimen with as needed Lasix for worsening edema. Her blood pressure was found to be controlled but on the soft side, so Midodrine 2.5 mg PO TID with meals was prescribed. No other medication changes were made or labs were ordered. She was told to follow-up with the  Cardiology office 4 weeks later to re-evaluate her blood pressure readings.   Today she says ***  Home Medications    Current Outpatient Medications  Medication Sig Dispense Refill   acetaminophen (TYLENOL) 500 MG tablet Take 500 mg by mouth See admin instructions. Take 1 tablet (500 mg) by mouth scheduled 3 times daily scheduled & 3 times daily as needed for pain (Not to exceed 3g/24 hrs)     aspirin 81 MG chewable tablet Chew 81 mg by mouth daily.     Calcium Carb-Cholecalciferol (CALCIUM 600/VITAMIN D3) 600-800 MG-UNIT TABS Take 1 tablet by mouth daily.     carbidopa-levodopa (SINEMET IR) 25-100 MG tablet Take 1 tablet by mouth 3 (three) times daily.     carbidopa-levodopa (SINEMET IR) 25-100 MG tablet Take 1 tablet by mouth at bedtime.     Cholecalciferol (VITAMIN D) 50 MCG (2000 UT) tablet Take 2,000 Units by mouth daily.     docusate sodium (COLACE) 100 MG capsule Take 100 mg by mouth at bedtime.     famotidine (PEPCID) 20 MG tablet Take 20 mg by mouth daily.     Ferrous Sulfate (SLOW FE) 142 (45 Fe) MG TBCR Take 142 mg by mouth in the morning.     furosemide (LASIX) 20 MG tablet Take 1 tablet (20 mg total) by mouth daily. 90 tablet 3   furosemide (LASIX) 20 MG  tablet Take 20 mg by mouth. Give '20mg'$  PO QD PRN for lower extremity swelling.     hydroxychloroquine (PLAQUENIL) 200 MG tablet Take 200 mg by mouth daily.     lactose free nutrition (BOOST) LIQD Take by mouth 2 (two) times daily. 1/2 box; Resident likes chocolate. Twice A Day     levothyroxine (SYNTHROID, LEVOTHROID) 50 MCG tablet Take 50 mcg by mouth daily before breakfast.      loperamide (IMODIUM A-D) 2 MG tablet Take 2 mg by mouth every 6 (six) hours as needed for diarrhea or loose stools.     lubiprostone (AMITIZA) 24 MCG capsule Take 24 mcg by mouth 2 (two) times daily with a meal.      midodrine (PROAMATINE) 2.5 MG tablet Take 1 tablet (2.5 mg total) by mouth 3 (three) times daily with meals. 270 tablet 3   Multiple Vitamin (MULTIVITAMIN WITH MINERALS) TABS tablet Take 1 tablet by mouth daily.     Multiple Vitamins-Minerals (PRESERVISION AREDS 2 PO) Take 1 tablet by mouth daily.     NYAMYC powder Apply topically.     pantoprazole (PROTONIX) 40 MG tablet Take 40 mg by mouth daily.     Polyethyl Glycol-Propyl Glycol (SYSTANE) 0.4-0.3 % SOLN Apply 1 drop to eye 2 (two) times daily as needed (dry eyes).      polyethylene glycol (MIRALAX / GLYCOLAX) 17 g packet Take 17 g by mouth daily.     potassium citrate (UROCIT-K) 10 MEQ (1080 MG) SR tablet Take 10 mEq by mouth 2 (two) times daily.     pramipexole (MIRAPEX) 0.125 MG tablet Take 0.125 mg by mouth at bedtime.     QUEtiapine (SEROQUEL) 25 MG tablet Take 25 mg by mouth See admin instructions.     QUEtiapine (SEROQUEL) 25 MG tablet Take 12.5 mg by mouth every morning.     rosuvastatin (CRESTOR) 20 MG tablet Take 10 mg by mouth daily.     saccharomyces boulardii (FLORASTOR) 250 MG capsule Take 250 mg by mouth daily.     senna-docusate (SENOKOT-S) 8.6-50 MG tablet Take 1 tablet by mouth at  bedtime.      sertraline (ZOLOFT) 50 MG tablet Take 100 mg by mouth at bedtime.     Skin Protectants, Misc. (EUCERIN) cream Apply 1 application topically  daily.     No current facility-administered medications for this visit.     Family History    Family History  Problem Relation Age of Onset   Heart attack Mother    Restless legs syndrome Mother    Heart failure Mother    Heart disease Mother    Hypertension Mother    COPD Father    She indicated that her mother is deceased. She indicated that her father is deceased. She indicated that her sister is alive. She indicated that her maternal grandmother is deceased. She indicated that her maternal grandfather is deceased. She indicated that her paternal grandmother is deceased. She indicated that her paternal grandfather is deceased. She indicated that her daughter is alive. She indicated that her son is alive.  Social History    Social History   Socioeconomic History   Marital status: Married    Spouse name: [name]   Number of children: 2   Years of education: HS   Highest education level: Not on file  Occupational History    Comment: Homemaker  Tobacco Use   Smoking status: Never   Smokeless tobacco: Never  Vaping Use   Vaping Use: Never used  Substance and Sexual Activity   Alcohol use: No    Alcohol/week: 0.0 standard drinks of alcohol   Drug use: No   Sexual activity: Never  Other Topics Concern   Not on file  Social History Narrative   Patient lives at [facility] home with her spouse. Married in 1957. Two people lives in the home, no pets.    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    Caffeine Use: tea   Social Determinants of Health   Financial Resource Strain: Low Risk  (10/27/2018)   Overall Financial Resource Strain (CARDIA)    Difficulty of Paying Living Expenses: Not hard at all  Food Insecurity: No Food Insecurity (10/27/2018)   Hunger Vital Sign    Worried About Running Out of Food in the Last Year: Never true    Ran Out of Food in the Last Year: Never true  Transportation Needs: No Transportation  Needs (10/27/2018)   PRAPARE - Hydrologist (Medical): No    Lack of Transportation (Non-Medical): No  Physical Activity: Insufficiently Active (10/27/2018)   Exercise Vital Sign    Days of Exercise per Week: 2 days    Minutes of Exercise per Session: 30 min  Stress: No Stress Concern Present (10/27/2018)   [place]    Feeling of Stress : Only a little  Social Connections: Socially Integrated (10/27/2018)   Social Connection and Isolation Panel [NHANES]    Frequency of Communication with Friends and Family: More than three times a week    Frequency of Social Gatherings with Friends and Family: More than three times a week    Attends Religious Services: More than 4 times per year    Active Member of [employer] or Organizations: Yes    Attends Archivist Meetings: More than 4 times per year    Marital Status: Married  Human resources officer Violence: Not At Risk (10/27/2018)   Humiliation, Afraid, Rape, and Kick questionnaire    Fear of Current  or Ex-Partner: No    Emotionally Abused: No    Physically Abused: No    Sexually Abused: No     Review of Systems    General:  No chills, fever, night sweats or weight changes.  Cardiovascular:  No chest pain, dyspnea on exertion, edema, orthopnea, palpitations, paroxysmal nocturnal dyspnea. Dermatological: No rash, lesions/masses Respiratory: No cough, dyspnea Urologic: No hematuria, dysuria Abdominal:   No nausea, vomiting, diarrhea, bright red blood per rectum, melena, or hematemesis Neurologic:  No visual changes, wkns, changes in mental status. All other systems reviewed and are otherwise negative except as noted above. ***    Physical Exam    VS:  There were no vitals taken for this visit. , BMI There is no height or weight on file to calculate BMI.    *** GEN: Well nourished, well developed, in no acute distress. HEENT:  normal. Neck: Supple, no JVD, carotid bruits, or masses. Cardiac: RRR, no murmurs, rubs, or gallops. No clubbing, cyanosis, edema.  Radials/DP/PT 2+ and equal bilaterally.  Respiratory:  Respirations regular and unlabored, clear to auscultation bilaterally. GI: Soft, nontender, nondistended, BS + x 4. MS: no deformity or atrophy. Skin: warm and dry, no rash. Neuro:  Strength and sensation are intact. Psych: Normal affect.  Accessory Clinical Findings    ECG personally reviewed by me today- *** - No acute changes  2D Echo on 11/15/20:   1. Left ventricular ejection fraction, by estimation, is 60 to 65%. The  left ventricle has normal function. The left ventricle has no regional  wall motion abnormalities. Left ventricular diastolic parameters are  consistent with Grade I diastolic  dysfunction (impaired relaxation).   2. Right ventricular systolic function is normal. The right ventricular  size is normal.   3. Left atrial size was mildly dilated.   4. The mitral valve is normal in structure. No evidence of mitral valve  regurgitation. No evidence of mitral stenosis.   5. The aortic valve is normal in structure. There is severe calcifcation  of the aortic valve. There is severe thickening of the aortic valve.  Aortic valve regurgitation is not visualized. Severe aortic valve  stenosis. Aortic valve mean gradient measures   42.0 mmHg.   6. The inferior vena cava is normal in size with greater than 50%  respiratory variability, suggesting right atrial pressure of 3 mmHg.  Comparison(s): No significant change from prior study.   Vascular ultrasound ABI with/without TBI (07/10/18): Right: Resting right ankle-brachial index is within normal range. No  evidence of significant right lower extremity arterial disease. The right  toe-brachial index is normal. RT great toe pressure = 84 mmHg. Left: Resting left ankle-brachial index indicates noncompressible left lower extremity arteries.The left  toe-brachial index is abnormal. LT Great toe pressure = 75 mmHg. PPG tracings appear dampened.   Right and left heart cath (02/05/2018) Prox LAD lesion is 35% stenosed. Mild nonobstructive CAD with 30-40% proximal LAD stenosis before the first diagonal vessel; normal left circumflex and normal RCA. Upper normal right heart pressures. Mild-to-moderate aortic stenosis with a mean gradient of 14 mm and aortic valve area of 1.4 centimeters squared. Recommendation: Medical therapy.  The patient will follow-up with Dr. Golden Hurter.  Stress test on 01/31/18: Nuclear stress EF: 67%. There was no ST segment deviation noted during stress. Defect 1: There is a large defect of severe severity present in the basal anteroseptal, mid anteroseptal and apex location. Defect 2: There is a large defect of severe severity present  in the basal inferolateral and mid inferolateral location. Technically difficult study; abnormal stress nuclear study with large fixed defect in the septum and apex and large partially reversible lateral defect; EF 67 with normal wall motion; findings suggest prior infarct vs significant soft tissue attenuation; there is ischemia in the lateral wall.    Lower extremity arterial duplex (05/22/12): Right ABI is 1.32; left is noncompressible. Bilateral ankle brachial indices were found to be unreliable due to the presence of noncompressible vessels. Patent bilateral lower extremity arterial system with biphasic waveform noted. Scattered calcific plaque noted.     Lab Results  Component Value Date   WBC 8.8 03/31/2022   HGB 9.6 (A) 03/31/2022   HCT 30 (A) 03/31/2022   MCV 98.3 09/27/2021   PLT 248 03/31/2022   Lab Results  Component Value Date   CREATININE 1.0 03/31/2022   BUN 20 03/31/2022   NA 140 03/31/2022   K 4.6 03/31/2022   CL 108 03/31/2022   CO2 23 (A) 03/31/2022   Lab Results  Component Value Date   ALT 12 03/31/2022   AST 18 03/31/2022   ALKPHOS 76 03/31/2022   BILITOT  0.6 04/24/2020   Lab Results  Component Value Date   CHOL 137 01/11/2022   HDL 47 01/11/2022   LDLCALC 64 01/11/2022   TRIG 184 (A) 01/11/2022   CHOLHDL 2.4 07/14/2019    No results found for: "HGBA1C"  Assessment & Plan   1.  Chronic distolic CHF*** 2. Hypertension - midodrine? 3. PVD 4. AS 5.HLD  Finis Bud, NP 06/17/2022, 5:37 PM

## 2022-06-18 ENCOUNTER — Ambulatory Visit: Payer: Medicare Other | Admitting: Nurse Practitioner

## 2022-06-18 DIAGNOSIS — R278 Other lack of coordination: Secondary | ICD-10-CM | POA: Diagnosis not present

## 2022-06-18 DIAGNOSIS — R29898 Other symptoms and signs involving the musculoskeletal system: Secondary | ICD-10-CM | POA: Diagnosis not present

## 2022-06-18 DIAGNOSIS — R2681 Unsteadiness on feet: Secondary | ICD-10-CM | POA: Diagnosis not present

## 2022-06-18 DIAGNOSIS — G20 Parkinson's disease: Secondary | ICD-10-CM | POA: Diagnosis not present

## 2022-06-18 DIAGNOSIS — M6281 Muscle weakness (generalized): Secondary | ICD-10-CM | POA: Diagnosis not present

## 2022-06-19 DIAGNOSIS — G20 Parkinson's disease: Secondary | ICD-10-CM | POA: Diagnosis not present

## 2022-06-19 DIAGNOSIS — R29898 Other symptoms and signs involving the musculoskeletal system: Secondary | ICD-10-CM | POA: Diagnosis not present

## 2022-06-19 DIAGNOSIS — R278 Other lack of coordination: Secondary | ICD-10-CM | POA: Diagnosis not present

## 2022-06-19 DIAGNOSIS — M6281 Muscle weakness (generalized): Secondary | ICD-10-CM | POA: Diagnosis not present

## 2022-06-19 DIAGNOSIS — R2681 Unsteadiness on feet: Secondary | ICD-10-CM | POA: Diagnosis not present

## 2022-06-20 ENCOUNTER — Ambulatory Visit (INDEPENDENT_AMBULATORY_CARE_PROVIDER_SITE_OTHER): Payer: Medicare Other | Admitting: Physician Assistant

## 2022-06-20 ENCOUNTER — Encounter: Payer: Self-pay | Admitting: Physician Assistant

## 2022-06-20 VITALS — BP 110/50 | HR 87 | Ht <= 58 in | Wt 130.6 lb

## 2022-06-20 DIAGNOSIS — M6281 Muscle weakness (generalized): Secondary | ICD-10-CM | POA: Diagnosis not present

## 2022-06-20 DIAGNOSIS — R278 Other lack of coordination: Secondary | ICD-10-CM | POA: Diagnosis not present

## 2022-06-20 DIAGNOSIS — G20 Parkinson's disease: Secondary | ICD-10-CM | POA: Diagnosis not present

## 2022-06-20 DIAGNOSIS — R2681 Unsteadiness on feet: Secondary | ICD-10-CM | POA: Diagnosis not present

## 2022-06-20 DIAGNOSIS — I9589 Other hypotension: Secondary | ICD-10-CM | POA: Diagnosis not present

## 2022-06-20 DIAGNOSIS — I350 Nonrheumatic aortic (valve) stenosis: Secondary | ICD-10-CM

## 2022-06-20 DIAGNOSIS — I5032 Chronic diastolic (congestive) heart failure: Secondary | ICD-10-CM | POA: Diagnosis not present

## 2022-06-20 DIAGNOSIS — R29898 Other symptoms and signs involving the musculoskeletal system: Secondary | ICD-10-CM | POA: Diagnosis not present

## 2022-06-20 DIAGNOSIS — I959 Hypotension, unspecified: Secondary | ICD-10-CM | POA: Insufficient documentation

## 2022-06-20 DIAGNOSIS — I2510 Atherosclerotic heart disease of native coronary artery without angina pectoris: Secondary | ICD-10-CM | POA: Diagnosis not present

## 2022-06-20 MED ORDER — MIDODRINE HCL 5 MG PO TABS: 5.0000 mg | ORAL_TABLET | Freq: Three times a day (TID) | ORAL | 1 refills | Status: DC

## 2022-06-20 NOTE — Assessment & Plan Note (Signed)
Volume status appears stable. She is fairly sedentary. Continue furosemide 20 mg once daily.

## 2022-06-20 NOTE — Patient Instructions (Addendum)
Medication Instructions:  Your physician has recommended you make the following change in your medication:   INCREASE the Midodrine to 5 mg taking 1 tablet three times a day  *If you need a refill on your cardiac medications before your next appointment, please call your pharmacy*   Lab Work: None ordered  If you have labs (blood work) drawn today and your tests are completely normal, you will receive your results only by: [place] (if you have MyChart) OR A paper copy in the mail If you have any lab test that is abnormal or we need to change your treatment, we will call you to review the results.   Testing/Procedures: None ordered   Follow-Up: At [facility], you and your health needs are our priority.  As part of our continuing mission to provide you with exceptional heart care, we have created designated Provider Care Teams.  These Care Teams include your primary Cardiologist (physician) and Advanced Practice Providers (APPs -  Physician Assistants and Nurse Practitioners) who all work together to provide you with the care you need, when you need it.  We recommend signing up for the patient portal called "MyChart".  Sign up information is provided on this After Visit Summary.  MyChart is used to connect with patients for Virtual Visits (Telemedicine).  Patients are able to view lab/test results, encounter notes, upcoming appointments, etc.  Non-urgent messages can be sent to your provider as well.   To learn more about what you can do with MyChart, go to [email or web address].    Your next appointment:   07/24/22 ARRIVE AT 2:55  The format for your next appointment:   In Person  Provider:   Fransico Him, MD or Richardson Dopp, PA-C  Other Instructions   Important Information About Sugar

## 2022-06-20 NOTE — Assessment & Plan Note (Addendum)
Mild plaque on cath in 2019. She is not having angina. Continue ASA 81 mg once daily, Crestor 10 mg once daily.

## 2022-06-20 NOTE — Assessment & Plan Note (Signed)
She is not a candidate for TAVR.

## 2022-06-20 NOTE — Progress Notes (Signed)
Cardiology Office Note:    Date:  06/20/2022   ID:  [name], DOB 01-26-1931, MRN [id number]  PCP:  [name], MD  [place] [facility] Cardiologist:  Fransico Him, MD     Referring MD: [name], MD   Chief Complaint:  F/u for low BP    Patient Profile: (HFpEF) heart failure with preserved ejection fraction  Severe aortic stenosis Structural Heart Eval:  Not a candidate for TAVR (frailty, adv age)>> palliative care Coronary artery disease  Cath in 01/2018: Mild nonobstructive disease (proximal LAD  35) Hypertension Peripheral arterial disease Chronic lower extremity edema Hyperlipidemia Hypothyroidism Parkinson's disease Right Bundle Branch Block  Sjogren syndrome Spinal stenosis  Prior CV Studies:  Echocardiogram 11/15/2020 EF 60-65, no RWMA, GR 1 DD, normal RVSF, mild LAE, severe aortic stenosis (mean gradient 42 mmHg, V-max 414 cm/s, DI 0.25)  Cardiac catheterization 02/05/2018 LAD proximal 35  Myoview 01/31/2018 Large fixed defect in septum and apex enlarged partially reversible lateral defect, EF 67 with normal wall motion; findings suggest prior infarct versus significant soft tissue attenuation, lateral wall ischemia   History of Present Illness:   [name] is a 88 y.o. female with the above problem list.  She was last seen by Dr. Radford Pax in June 2023.  The patient's blood pressure was somewhat low and she was placed on Midodrine with plans to follow-up today. She is a resident at [facility]. She is here today with her husband. She is mainly in a wheelchair. She does participate in PT. She gets short of breath with this. She has not had syncope, orthopnea, chest pain. She has mild leg edema without change. She does get dizzy at times. Her husband notes systolic BPs in the 17P at her SNF.         Past Medical History:  Diagnosis Date   Abnormal nuclear stress test    Actinic cheilitis 04/23/2019   Anemia    iron deficiency     Anxiety    Aortic stenosis    severe by echo 2021    Arthritis    Broken arm    right    Chronic diastolic CHF (congestive heart failure) ([place])    Chronic pain    Cognitive communication deficit    Cognitive deficits    Communication   Constipation    Depression    Erosive (osteo)arthritis 03/09/2020   Family history of adverse reaction to anesthesia    pt. states sister vomits   Fibromyalgia    GERD (gastroesophageal reflux disease)    H/O hiatal hernia    Hallucinations, visual 07/21/2018   03/10/20 psych consult.    Hematuria 05/18/2020   History of bronchitis    History of kidney stones    Hypercholesterolemia    Hypertension    dr t turner   Hypothyroidism    Memory loss 04/21/2018   08/08/18 CT head no traumatic findings, atrophy.  09/27/19 MMSE 25/30, failed the clock drawing   Microhematuria 03/24/2015   Mild CAD    Neuropathy    Osteoporosis    Parkinson disease (HCC)    PVD (peripheral vascular disease) (HCC)    99% stenosis of left anteiror tibial artery, mod stenosis of left distal SFA and popliteal artery followed by [name]   RBBB    noted on EKG 2018   Restless leg syndrome    Sepsis ([place])    hx of due to [facility]   Septic shock ([place]) 04/19/2020  Shortness of breath    with exertion - chronic    Sjogren's disease ([place])    Sjogren's syndrome ([place]) 02/04/2018   04/16/19 rheumatology: Erosive OA of hands, Sjogrens syndrome, low back pain at multiple sites, recurrent kidney stones, age related osteoporosis w/o current pathological fracture, f/u 6 months.    SOB (shortness of breath)    chronic due to diastolic dysfunction, deconditioning, obesity   Spinal stenosis    lumbar region    Tremor    Unstable gait 04/21/2018   Unsteadiness on feet    Urinary frequency 09/18/2018   Urinary tract infection    Visual hallucinations    Current Medications: Current Meds  Medication Sig   acetaminophen (TYLENOL) 500 MG tablet Take 500 mg by mouth See admin  instructions. Take 1 tablet (500 mg) by mouth scheduled 3 times daily scheduled & 3 times daily as needed for pain (Not to exceed 3g/24 hrs)   aspirin 81 MG chewable tablet Chew 81 mg by mouth daily.   Calcium Carb-Cholecalciferol (CALCIUM 600/VITAMIN D3) 600-800 MG-UNIT TABS Take 1 tablet by mouth daily.   carbidopa-levodopa (SINEMET IR) 25-100 MG tablet Take 1 tablet by mouth 3 (three) times daily.   Cholecalciferol (VITAMIN D) 50 MCG (2000 UT) tablet Take 2,000 Units by mouth daily.   docusate sodium (COLACE) 100 MG capsule Take 100 mg by mouth at bedtime.   famotidine (PEPCID) 20 MG tablet Take 20 mg by mouth daily.   Ferrous Sulfate (SLOW FE) 142 (45 Fe) MG TBCR Take 142 mg by mouth in the morning.   furosemide (LASIX) 20 MG tablet Take 1 tablet (20 mg total) by mouth daily.   hydroxychloroquine (PLAQUENIL) 200 MG tablet Take 200 mg by mouth daily.   lactose free nutrition (BOOST) LIQD Take by mouth 2 (two) times daily. 1/2 box; Resident likes chocolate. Twice A Day   levothyroxine (SYNTHROID, LEVOTHROID) 50 MCG tablet Take 50 mcg by mouth daily before breakfast.    loperamide (IMODIUM A-D) 2 MG tablet Take 2 mg by mouth every 6 (six) hours as needed for diarrhea or loose stools.   lubiprostone (AMITIZA) 24 MCG capsule Take 24 mcg by mouth 2 (two) times daily with a meal.    midodrine (PROAMATINE) 5 MG tablet Take 1 tablet (5 mg total) by mouth 3 (three) times daily with meals.   Multiple Vitamin (MULTIVITAMIN WITH MINERALS) TABS tablet Take 1 tablet by mouth daily.   Multiple Vitamins-Minerals (PRESERVISION AREDS 2 PO) Take 1 tablet by mouth daily.   NYAMYC powder Apply 1 Application topically daily.   pantoprazole (PROTONIX) 40 MG tablet Take 40 mg by mouth daily.   Polyethyl Glycol-Propyl Glycol (SYSTANE) 0.4-0.3 % SOLN Apply 1 drop to eye 2 (two) times daily as needed (dry eyes).    polyethylene glycol (MIRALAX / GLYCOLAX) 17 g packet Take 17 g by mouth daily.   potassium citrate  (UROCIT-K) 10 MEQ (1080 MG) SR tablet Take 10 mEq by mouth 2 (two) times daily.   pramipexole (MIRAPEX) 0.125 MG tablet Take 0.125 mg by mouth at bedtime.   QUEtiapine (SEROQUEL) 25 MG tablet Take 25 mg by mouth every evening.   QUEtiapine (SEROQUEL) 25 MG tablet Take 12.5 mg by mouth every morning.   rosuvastatin (CRESTOR) 20 MG tablet Take 10 mg by mouth daily.   saccharomyces boulardii (FLORASTOR) 250 MG capsule Take 250 mg by mouth daily.   senna-docusate (SENOKOT-S) 8.6-50 MG tablet Take 1 tablet by mouth at bedtime.    sertraline (  ZOLOFT) 50 MG tablet Take 100 mg by mouth at bedtime.   Skin Protectants, Misc. (EUCERIN) cream Apply 1 application topically daily.   [DISCONTINUED] midodrine (PROAMATINE) 2.5 MG tablet Take 1 tablet (2.5 mg total) by mouth 3 (three) times daily with meals.    Allergies:   Phosphate, Adrenalone, Codeine, Lactose, Lactose intolerance (gi), Latex, Lyrica [pregabalin], Morphine, Other, Plaquenil [hydroxychloroquine sulfate], Reglan [metoclopramide], Requip [ropinirole hcl], Septra [sulfamethoxazole-trimethoprim], Shellfish allergy, and Sulfa antibiotics   Social History   Tobacco Use   Smoking status: Never   Smokeless tobacco: Never  Vaping Use   Vaping Use: Never used  Substance Use Topics   Alcohol use: No    Alcohol/week: 0.0 standard drinks of alcohol   Drug use: No    Family Hx: The patient's family history includes COPD in her father; Heart attack in her mother; Heart disease in her mother; Heart failure in her mother; Hypertension in her mother; Restless legs syndrome in her mother.  Review of Systems  Gastrointestinal:  Negative for hematochezia.  Genitourinary:  Negative for hematuria.     EKGs/Labs/Other Test Reviewed:    EKG:  EKG is not ordered today.  The ekg ordered today demonstrates n/a  Recent Labs: 01/11/2022: TSH 1.67 03/31/2022: ALT 12; BUN 20; Creatinine 1.0; Hemoglobin 9.6; Platelets 248; Potassium 4.6; Sodium 140   Recent  Lipid Panel Recent Labs    01/11/22 0000  CHOL 137  TRIG 184*  HDL 47  LDLCALC 64     Risk Assessment/Calculations/Metrics:              Physical Exam:    VS:  BP (!) 110/50   Pulse 87   Ht '4\' 8"'$  (1.422 m)   Wt 130 lb 9.6 oz (59.2 kg)   SpO2 98%   BMI 29.28 kg/m     Wt Readings from Last 3 Encounters:  06/20/22 130 lb 9.6 oz (59.2 kg)  06/05/22 132 lb 6.4 oz (60.1 kg)  05/31/22 132 lb 4.8 oz (60 kg)    Constitutional:      Appearance: Healthy appearance. Not in distress.  Pulmonary:     Effort: Pulmonary effort is normal.     Breath sounds: No wheezing. No rales.  Cardiovascular:     Normal rate. Regular rhythm. Normal S1. Normal S2.      Murmurs: There is a grade 3/6 crescendo-decrescendo systolic murmur at the URSB.  Edema:    Ankle: bilateral trace edema of the ankle. Abdominal:     Palpations: Abdomen is soft.  Musculoskeletal:     Cervical back: Neck supple. Skin:    General: Skin is warm and dry.  Neurological:     Mental Status: Alert.         ASSESSMENT & PLAN:   Arterial hypotension Her BP remains on the soft side. Her husband notes BPs in the 36I systolic at her SNF. She is symptomatic with dizziness but no syncope. Increase midodrine to 5 mg three times a day. F/u with Dr. Radford Pax or me in 4-6 weeks.   (HFpEF) heart failure with preserved ejection fraction (HCC) Volume status appears stable. She is fairly sedentary. Continue furosemide 20 mg once daily.   Severe aortic stenosis - not a candidate for TAVR She is not a candidate for TAVR.  Coronary artery disease involving native coronary artery of native heart without angina pectoris Mild plaque on cath in 2019. She is not having angina. Continue ASA 81 mg once daily, Crestor 10 mg once daily.  Dispo:  Return in about 6 weeks (around 08/01/2022) for Routine Follow Up w/ Dr. Radford Pax, or Richardson Dopp, PA-C.   Medication Adjustments/Labs and Tests Ordered: Current medicines are  reviewed at length with the patient today.  Concerns regarding medicines are outlined above.  Tests Ordered: No orders of the defined types were placed in this encounter.  Medication Changes: Meds ordered this encounter  Medications   midodrine (PROAMATINE) 5 MG tablet    Sig: Take 1 tablet (5 mg total) by mouth 3 (three) times daily with meals.    Dispense:  270 tablet    Refill:  1   Signed, Richardson Dopp, PA-C  06/20/2022 2:46 PM    [address], [place], [place]  [address] Phone: [phone]; Fax: [phone]

## 2022-06-20 NOTE — Assessment & Plan Note (Signed)
Her BP remains on the soft side. Her husband notes BPs in the 01U systolic at her SNF. She is symptomatic with dizziness but no syncope. Increase midodrine to 5 mg three times a day. F/u with Dr. Radford Pax or me in 4-6 weeks.

## 2022-06-21 DIAGNOSIS — G20 Parkinson's disease: Secondary | ICD-10-CM | POA: Diagnosis not present

## 2022-06-21 DIAGNOSIS — R29898 Other symptoms and signs involving the musculoskeletal system: Secondary | ICD-10-CM | POA: Diagnosis not present

## 2022-06-21 DIAGNOSIS — R278 Other lack of coordination: Secondary | ICD-10-CM | POA: Diagnosis not present

## 2022-06-21 DIAGNOSIS — M6281 Muscle weakness (generalized): Secondary | ICD-10-CM | POA: Diagnosis not present

## 2022-06-21 DIAGNOSIS — R2681 Unsteadiness on feet: Secondary | ICD-10-CM | POA: Diagnosis not present

## 2022-06-22 DIAGNOSIS — G20 Parkinson's disease: Secondary | ICD-10-CM | POA: Diagnosis not present

## 2022-06-22 DIAGNOSIS — M6281 Muscle weakness (generalized): Secondary | ICD-10-CM | POA: Diagnosis not present

## 2022-06-22 DIAGNOSIS — R278 Other lack of coordination: Secondary | ICD-10-CM | POA: Diagnosis not present

## 2022-06-22 DIAGNOSIS — R2681 Unsteadiness on feet: Secondary | ICD-10-CM | POA: Diagnosis not present

## 2022-06-22 DIAGNOSIS — R29898 Other symptoms and signs involving the musculoskeletal system: Secondary | ICD-10-CM | POA: Diagnosis not present

## 2022-06-25 DIAGNOSIS — R2681 Unsteadiness on feet: Secondary | ICD-10-CM | POA: Diagnosis not present

## 2022-06-25 DIAGNOSIS — R278 Other lack of coordination: Secondary | ICD-10-CM | POA: Diagnosis not present

## 2022-06-25 DIAGNOSIS — M6281 Muscle weakness (generalized): Secondary | ICD-10-CM | POA: Diagnosis not present

## 2022-06-25 DIAGNOSIS — R29898 Other symptoms and signs involving the musculoskeletal system: Secondary | ICD-10-CM | POA: Diagnosis not present

## 2022-06-25 DIAGNOSIS — G20 Parkinson's disease: Secondary | ICD-10-CM | POA: Diagnosis not present

## 2022-06-26 DIAGNOSIS — M6281 Muscle weakness (generalized): Secondary | ICD-10-CM | POA: Diagnosis not present

## 2022-06-26 DIAGNOSIS — R29898 Other symptoms and signs involving the musculoskeletal system: Secondary | ICD-10-CM | POA: Diagnosis not present

## 2022-06-26 DIAGNOSIS — R2681 Unsteadiness on feet: Secondary | ICD-10-CM | POA: Diagnosis not present

## 2022-06-26 DIAGNOSIS — G20 Parkinson's disease: Secondary | ICD-10-CM | POA: Diagnosis not present

## 2022-06-26 DIAGNOSIS — R278 Other lack of coordination: Secondary | ICD-10-CM | POA: Diagnosis not present

## 2022-06-27 DIAGNOSIS — M6281 Muscle weakness (generalized): Secondary | ICD-10-CM | POA: Diagnosis not present

## 2022-06-27 DIAGNOSIS — R29898 Other symptoms and signs involving the musculoskeletal system: Secondary | ICD-10-CM | POA: Diagnosis not present

## 2022-06-27 DIAGNOSIS — R278 Other lack of coordination: Secondary | ICD-10-CM | POA: Diagnosis not present

## 2022-06-27 DIAGNOSIS — R2681 Unsteadiness on feet: Secondary | ICD-10-CM | POA: Diagnosis not present

## 2022-06-27 DIAGNOSIS — G20 Parkinson's disease: Secondary | ICD-10-CM | POA: Diagnosis not present

## 2022-06-28 DIAGNOSIS — R278 Other lack of coordination: Secondary | ICD-10-CM | POA: Diagnosis not present

## 2022-06-28 DIAGNOSIS — M6281 Muscle weakness (generalized): Secondary | ICD-10-CM | POA: Diagnosis not present

## 2022-06-28 DIAGNOSIS — R29898 Other symptoms and signs involving the musculoskeletal system: Secondary | ICD-10-CM | POA: Diagnosis not present

## 2022-06-28 DIAGNOSIS — R2681 Unsteadiness on feet: Secondary | ICD-10-CM | POA: Diagnosis not present

## 2022-06-28 DIAGNOSIS — G20 Parkinson's disease: Secondary | ICD-10-CM | POA: Diagnosis not present

## 2022-06-29 DIAGNOSIS — M6281 Muscle weakness (generalized): Secondary | ICD-10-CM | POA: Diagnosis not present

## 2022-06-29 DIAGNOSIS — R29898 Other symptoms and signs involving the musculoskeletal system: Secondary | ICD-10-CM | POA: Diagnosis not present

## 2022-06-29 DIAGNOSIS — R2681 Unsteadiness on feet: Secondary | ICD-10-CM | POA: Diagnosis not present

## 2022-06-29 DIAGNOSIS — G20 Parkinson's disease: Secondary | ICD-10-CM | POA: Diagnosis not present

## 2022-06-29 DIAGNOSIS — R278 Other lack of coordination: Secondary | ICD-10-CM | POA: Diagnosis not present

## 2022-07-02 DIAGNOSIS — M6281 Muscle weakness (generalized): Secondary | ICD-10-CM | POA: Diagnosis not present

## 2022-07-02 DIAGNOSIS — R278 Other lack of coordination: Secondary | ICD-10-CM | POA: Diagnosis not present

## 2022-07-02 DIAGNOSIS — G20 Parkinson's disease: Secondary | ICD-10-CM | POA: Diagnosis not present

## 2022-07-02 DIAGNOSIS — R29898 Other symptoms and signs involving the musculoskeletal system: Secondary | ICD-10-CM | POA: Diagnosis not present

## 2022-07-02 DIAGNOSIS — R2681 Unsteadiness on feet: Secondary | ICD-10-CM | POA: Diagnosis not present

## 2022-07-03 DIAGNOSIS — M6281 Muscle weakness (generalized): Secondary | ICD-10-CM | POA: Diagnosis not present

## 2022-07-03 DIAGNOSIS — R29898 Other symptoms and signs involving the musculoskeletal system: Secondary | ICD-10-CM | POA: Diagnosis not present

## 2022-07-03 DIAGNOSIS — G20 Parkinson's disease: Secondary | ICD-10-CM | POA: Diagnosis not present

## 2022-07-03 DIAGNOSIS — R2681 Unsteadiness on feet: Secondary | ICD-10-CM | POA: Diagnosis not present

## 2022-07-03 DIAGNOSIS — R278 Other lack of coordination: Secondary | ICD-10-CM | POA: Diagnosis not present

## 2022-07-04 DIAGNOSIS — R278 Other lack of coordination: Secondary | ICD-10-CM | POA: Diagnosis not present

## 2022-07-04 DIAGNOSIS — R2681 Unsteadiness on feet: Secondary | ICD-10-CM | POA: Diagnosis not present

## 2022-07-04 DIAGNOSIS — M6281 Muscle weakness (generalized): Secondary | ICD-10-CM | POA: Diagnosis not present

## 2022-07-04 DIAGNOSIS — G20 Parkinson's disease: Secondary | ICD-10-CM | POA: Diagnosis not present

## 2022-07-04 DIAGNOSIS — R29898 Other symptoms and signs involving the musculoskeletal system: Secondary | ICD-10-CM | POA: Diagnosis not present

## 2022-07-05 ENCOUNTER — Ambulatory Visit: Payer: Medicare Other | Admitting: Podiatry

## 2022-07-05 DIAGNOSIS — M6281 Muscle weakness (generalized): Secondary | ICD-10-CM | POA: Diagnosis not present

## 2022-07-05 DIAGNOSIS — G20 Parkinson's disease: Secondary | ICD-10-CM | POA: Diagnosis not present

## 2022-07-05 DIAGNOSIS — R278 Other lack of coordination: Secondary | ICD-10-CM | POA: Diagnosis not present

## 2022-07-05 DIAGNOSIS — R29898 Other symptoms and signs involving the musculoskeletal system: Secondary | ICD-10-CM | POA: Diagnosis not present

## 2022-07-05 DIAGNOSIS — R2681 Unsteadiness on feet: Secondary | ICD-10-CM | POA: Diagnosis not present

## 2022-07-06 DIAGNOSIS — G20 Parkinson's disease: Secondary | ICD-10-CM | POA: Diagnosis not present

## 2022-07-06 DIAGNOSIS — R29898 Other symptoms and signs involving the musculoskeletal system: Secondary | ICD-10-CM | POA: Diagnosis not present

## 2022-07-06 DIAGNOSIS — R278 Other lack of coordination: Secondary | ICD-10-CM | POA: Diagnosis not present

## 2022-07-06 DIAGNOSIS — M6281 Muscle weakness (generalized): Secondary | ICD-10-CM | POA: Diagnosis not present

## 2022-07-06 DIAGNOSIS — R2681 Unsteadiness on feet: Secondary | ICD-10-CM | POA: Diagnosis not present

## 2022-07-09 ENCOUNTER — Ambulatory Visit (INDEPENDENT_AMBULATORY_CARE_PROVIDER_SITE_OTHER): Payer: Medicare Other | Admitting: Podiatry

## 2022-07-09 DIAGNOSIS — G629 Polyneuropathy, unspecified: Secondary | ICD-10-CM

## 2022-07-09 DIAGNOSIS — B351 Tinea unguium: Secondary | ICD-10-CM | POA: Diagnosis not present

## 2022-07-09 DIAGNOSIS — R441 Visual hallucinations: Secondary | ICD-10-CM | POA: Diagnosis not present

## 2022-07-09 DIAGNOSIS — I2510 Atherosclerotic heart disease of native coronary artery without angina pectoris: Secondary | ICD-10-CM | POA: Diagnosis not present

## 2022-07-09 DIAGNOSIS — M79675 Pain in left toe(s): Secondary | ICD-10-CM | POA: Diagnosis not present

## 2022-07-09 DIAGNOSIS — M79674 Pain in right toe(s): Secondary | ICD-10-CM | POA: Diagnosis not present

## 2022-07-09 DIAGNOSIS — F323 Major depressive disorder, single episode, severe with psychotic features: Secondary | ICD-10-CM | POA: Diagnosis not present

## 2022-07-09 DIAGNOSIS — M2041 Other hammer toe(s) (acquired), right foot: Secondary | ICD-10-CM

## 2022-07-09 DIAGNOSIS — M2042 Other hammer toe(s) (acquired), left foot: Secondary | ICD-10-CM

## 2022-07-09 DIAGNOSIS — F411 Generalized anxiety disorder: Secondary | ICD-10-CM | POA: Diagnosis not present

## 2022-07-09 NOTE — Progress Notes (Unsigned)
Trimmed Toe crest Volt gel at night for nerve pain

## 2022-07-10 DIAGNOSIS — R29898 Other symptoms and signs involving the musculoskeletal system: Secondary | ICD-10-CM | POA: Diagnosis not present

## 2022-07-10 DIAGNOSIS — R2681 Unsteadiness on feet: Secondary | ICD-10-CM | POA: Diagnosis not present

## 2022-07-10 DIAGNOSIS — M6281 Muscle weakness (generalized): Secondary | ICD-10-CM | POA: Diagnosis not present

## 2022-07-10 DIAGNOSIS — R278 Other lack of coordination: Secondary | ICD-10-CM | POA: Diagnosis not present

## 2022-07-10 DIAGNOSIS — G20 Parkinson's disease: Secondary | ICD-10-CM | POA: Diagnosis not present

## 2022-07-11 DIAGNOSIS — M6281 Muscle weakness (generalized): Secondary | ICD-10-CM | POA: Diagnosis not present

## 2022-07-11 DIAGNOSIS — R278 Other lack of coordination: Secondary | ICD-10-CM | POA: Diagnosis not present

## 2022-07-11 DIAGNOSIS — R2681 Unsteadiness on feet: Secondary | ICD-10-CM | POA: Diagnosis not present

## 2022-07-11 DIAGNOSIS — R29898 Other symptoms and signs involving the musculoskeletal system: Secondary | ICD-10-CM | POA: Diagnosis not present

## 2022-07-11 DIAGNOSIS — G20 Parkinson's disease: Secondary | ICD-10-CM | POA: Diagnosis not present

## 2022-07-12 DIAGNOSIS — G20 Parkinson's disease: Secondary | ICD-10-CM | POA: Diagnosis not present

## 2022-07-12 DIAGNOSIS — R29898 Other symptoms and signs involving the musculoskeletal system: Secondary | ICD-10-CM | POA: Diagnosis not present

## 2022-07-12 DIAGNOSIS — R2681 Unsteadiness on feet: Secondary | ICD-10-CM | POA: Diagnosis not present

## 2022-07-12 DIAGNOSIS — R278 Other lack of coordination: Secondary | ICD-10-CM | POA: Diagnosis not present

## 2022-07-12 DIAGNOSIS — M6281 Muscle weakness (generalized): Secondary | ICD-10-CM | POA: Diagnosis not present

## 2022-07-13 DIAGNOSIS — G20 Parkinson's disease: Secondary | ICD-10-CM | POA: Diagnosis not present

## 2022-07-13 DIAGNOSIS — R278 Other lack of coordination: Secondary | ICD-10-CM | POA: Diagnosis not present

## 2022-07-13 DIAGNOSIS — R2681 Unsteadiness on feet: Secondary | ICD-10-CM | POA: Diagnosis not present

## 2022-07-13 DIAGNOSIS — M6281 Muscle weakness (generalized): Secondary | ICD-10-CM | POA: Diagnosis not present

## 2022-07-13 DIAGNOSIS — R29898 Other symptoms and signs involving the musculoskeletal system: Secondary | ICD-10-CM | POA: Diagnosis not present

## 2022-07-16 DIAGNOSIS — M6281 Muscle weakness (generalized): Secondary | ICD-10-CM | POA: Diagnosis not present

## 2022-07-16 DIAGNOSIS — R278 Other lack of coordination: Secondary | ICD-10-CM | POA: Diagnosis not present

## 2022-07-16 DIAGNOSIS — G20 Parkinson's disease: Secondary | ICD-10-CM | POA: Diagnosis not present

## 2022-07-16 DIAGNOSIS — R2681 Unsteadiness on feet: Secondary | ICD-10-CM | POA: Diagnosis not present

## 2022-07-16 DIAGNOSIS — R29898 Other symptoms and signs involving the musculoskeletal system: Secondary | ICD-10-CM | POA: Diagnosis not present

## 2022-07-17 DIAGNOSIS — G20 Parkinson's disease: Secondary | ICD-10-CM | POA: Diagnosis not present

## 2022-07-17 DIAGNOSIS — R278 Other lack of coordination: Secondary | ICD-10-CM | POA: Diagnosis not present

## 2022-07-17 DIAGNOSIS — R29898 Other symptoms and signs involving the musculoskeletal system: Secondary | ICD-10-CM | POA: Diagnosis not present

## 2022-07-17 DIAGNOSIS — R2681 Unsteadiness on feet: Secondary | ICD-10-CM | POA: Diagnosis not present

## 2022-07-17 DIAGNOSIS — M6281 Muscle weakness (generalized): Secondary | ICD-10-CM | POA: Diagnosis not present

## 2022-07-19 DIAGNOSIS — G20 Parkinson's disease: Secondary | ICD-10-CM | POA: Diagnosis not present

## 2022-07-19 DIAGNOSIS — R278 Other lack of coordination: Secondary | ICD-10-CM | POA: Diagnosis not present

## 2022-07-19 DIAGNOSIS — R2681 Unsteadiness on feet: Secondary | ICD-10-CM | POA: Diagnosis not present

## 2022-07-19 DIAGNOSIS — R29898 Other symptoms and signs involving the musculoskeletal system: Secondary | ICD-10-CM | POA: Diagnosis not present

## 2022-07-19 DIAGNOSIS — M6281 Muscle weakness (generalized): Secondary | ICD-10-CM | POA: Diagnosis not present

## 2022-07-20 ENCOUNTER — Ambulatory Visit: Payer: Medicare Other | Admitting: Physician Assistant

## 2022-07-20 DIAGNOSIS — R29898 Other symptoms and signs involving the musculoskeletal system: Secondary | ICD-10-CM | POA: Diagnosis not present

## 2022-07-20 DIAGNOSIS — G20 Parkinson's disease: Secondary | ICD-10-CM | POA: Diagnosis not present

## 2022-07-20 DIAGNOSIS — R2681 Unsteadiness on feet: Secondary | ICD-10-CM | POA: Diagnosis not present

## 2022-07-20 DIAGNOSIS — R278 Other lack of coordination: Secondary | ICD-10-CM | POA: Diagnosis not present

## 2022-07-20 DIAGNOSIS — M6281 Muscle weakness (generalized): Secondary | ICD-10-CM | POA: Diagnosis not present

## 2022-07-23 DIAGNOSIS — R2681 Unsteadiness on feet: Secondary | ICD-10-CM | POA: Diagnosis not present

## 2022-07-23 DIAGNOSIS — M6281 Muscle weakness (generalized): Secondary | ICD-10-CM | POA: Diagnosis not present

## 2022-07-23 DIAGNOSIS — R29898 Other symptoms and signs involving the musculoskeletal system: Secondary | ICD-10-CM | POA: Diagnosis not present

## 2022-07-23 DIAGNOSIS — R278 Other lack of coordination: Secondary | ICD-10-CM | POA: Diagnosis not present

## 2022-07-23 DIAGNOSIS — G20 Parkinson's disease: Secondary | ICD-10-CM | POA: Diagnosis not present

## 2022-07-24 ENCOUNTER — Ambulatory Visit (INDEPENDENT_AMBULATORY_CARE_PROVIDER_SITE_OTHER): Payer: Medicare Other | Admitting: Physician Assistant

## 2022-07-24 ENCOUNTER — Encounter: Payer: Self-pay | Admitting: Physician Assistant

## 2022-07-24 VITALS — BP 124/56 | HR 84 | Ht <= 58 in | Wt 130.0 lb

## 2022-07-24 DIAGNOSIS — R2681 Unsteadiness on feet: Secondary | ICD-10-CM | POA: Diagnosis not present

## 2022-07-24 DIAGNOSIS — I2510 Atherosclerotic heart disease of native coronary artery without angina pectoris: Secondary | ICD-10-CM | POA: Diagnosis not present

## 2022-07-24 DIAGNOSIS — D649 Anemia, unspecified: Secondary | ICD-10-CM | POA: Insufficient documentation

## 2022-07-24 DIAGNOSIS — I9589 Other hypotension: Secondary | ICD-10-CM | POA: Diagnosis not present

## 2022-07-24 DIAGNOSIS — I350 Nonrheumatic aortic (valve) stenosis: Secondary | ICD-10-CM | POA: Diagnosis not present

## 2022-07-24 DIAGNOSIS — I5032 Chronic diastolic (congestive) heart failure: Secondary | ICD-10-CM | POA: Diagnosis not present

## 2022-07-24 DIAGNOSIS — M6281 Muscle weakness (generalized): Secondary | ICD-10-CM | POA: Diagnosis not present

## 2022-07-24 DIAGNOSIS — G20 Parkinson's disease: Secondary | ICD-10-CM | POA: Diagnosis not present

## 2022-07-24 DIAGNOSIS — R29898 Other symptoms and signs involving the musculoskeletal system: Secondary | ICD-10-CM | POA: Diagnosis not present

## 2022-07-24 DIAGNOSIS — R278 Other lack of coordination: Secondary | ICD-10-CM | POA: Diagnosis not present

## 2022-07-24 NOTE — Patient Instructions (Addendum)
Medication Instructions:  Your physician recommends that you continue on your current medications as directed. Please refer to the Current Medication list given to you today.  *If you need a refill on your cardiac medications before your next appointment, please call your pharmacy*   Lab Work: None ordered TODAY, BUT PLEASE HAVE YOUR PRIMARY CARE DR DRAW A BMET & LIVER FUNCTION AND MAKE SURE WE GET THE RESULTS.  If you have labs (blood work) drawn today and your tests are completely normal, you will receive your results only by: [place] (if you have MyChart) OR A paper copy in the mail If you have any lab test that is abnormal or we need to change your treatment, we will call you to review the results.   Testing/Procedures: None ordered    Follow-Up: At [facility], you and your health needs are our priority.  As part of our continuing mission to provide you with exceptional heart care, we have created designated Provider Care Teams.  These Care Teams include your primary Cardiologist (physician) and Advanced Practice Providers (APPs -  Physician Assistants and Nurse Practitioners) who all work together to provide you with the care you need, when you need it.  We recommend signing up for the patient portal called "MyChart".  Sign up information is provided on this After Visit Summary.  MyChart is used to connect with patients for Virtual Visits (Telemedicine).  Patients are able to view lab/test results, encounter notes, upcoming appointments, etc.  Non-urgent messages can be sent to your provider as well.   To learn more about what you can do with MyChart, go to [email or web address].    Your next appointment:   6 month(s)  01/25/23 ARRIVE AT 1:45  The format for your next appointment:   In Person  Provider:   Fransico Him, MD  or Richardson Dopp, PA-C         Other Instructions   Important Information About Sugar

## 2022-07-24 NOTE — Assessment & Plan Note (Signed)
Hgb in April 2023 was 9.6. I explained to her that this can also make you short of breath. I considered repeating her CBC. But as noted, she will have labs with her PCP in the next couple of weeks for an annual exam.

## 2022-07-24 NOTE — Progress Notes (Signed)
Cardiology Office Note:    Date:  07/24/2022   ID:  [name], DOB January 20, 1931, MRN [id number]  PCP:  [name], MD  [place] [facility] Cardiologist:  Fransico Him, MD     Referring MD: [name], MD   Chief Complaint:  F/u for hypotension    Patient Profile: (HFpEF) heart failure with preserved ejection fraction  Severe aortic stenosis Structural Heart Eval:  Not a candidate for TAVR (frailty, adv age)>> palliative care Coronary artery disease  Cath in 01/2018: Mild nonobstructive disease (proximal LAD  35) Hypertension Peripheral arterial disease Chronic lower extremity edema Hyperlipidemia Hypothyroidism Parkinson's disease Right Bundle Branch Block  Sjogren syndrome Spinal stenosis  Prior CV Studies: Echocardiogram 11/15/2020 EF 60-65, no RWMA, GR 1 DD, normal RVSF, mild LAE, severe aortic stenosis (mean gradient 42 mmHg, V-max 414 cm/s, DI 0.25)   Cardiac catheterization 02/05/2018 LAD proximal 35   Myoview 01/31/2018 Large fixed defect in septum and apex enlarged partially reversible lateral defect, EF 67 with normal wall motion; findings suggest prior infarct versus significant soft tissue attenuation, lateral wall ischemia  History of Present Illness:   [name] is a 88 y.o. female resident of [facility] (SNF) with the above problem list.  She was last seen 06/20/2022.  She continued to have symptomatic low blood pressure.  I increased her midodrine to 5 mg 3 times a day.  She returns for follow-up. She is here with her husband. She arrives in a wheelchair. She notes shortness of breath with minimal activity. She has not had chest pain, syncope. She has some mild leg edema.     Past Medical History:  Diagnosis Date   Abnormal nuclear stress test    Actinic cheilitis 04/23/2019   Anemia    iron deficiency    Anxiety    Aortic stenosis    severe by echo 2021    Arthritis    Broken arm    right    Chronic diastolic CHF  (congestive heart failure) ([place])    Chronic pain    Cognitive communication deficit    Cognitive deficits    Communication   Constipation    Depression    Erosive (osteo)arthritis 03/09/2020   Family history of adverse reaction to anesthesia    pt. states sister vomits   Fibromyalgia    GERD (gastroesophageal reflux disease)    H/O hiatal hernia    Hallucinations, visual 07/21/2018   03/10/20 psych consult.    Hematuria 05/18/2020   History of bronchitis    History of kidney stones    Hypercholesterolemia    Hypertension    dr t turner   Hypothyroidism    Memory loss 04/21/2018   08/08/18 CT head no traumatic findings, atrophy.  09/27/19 MMSE 25/30, failed the clock drawing   Microhematuria 03/24/2015   Mild CAD    Neuropathy    Osteoporosis    Parkinson disease (HCC)    PVD (peripheral vascular disease) (HCC)    99% stenosis of left anteiror tibial artery, mod stenosis of left distal SFA and popliteal artery followed by [name]   RBBB    noted on EKG 2018   Restless leg syndrome    Sepsis ([place])    hx of due to [facility]   Septic shock ([place]) 04/19/2020   Shortness of breath    with exertion - chronic    Sjogren's disease ([place])    Sjogren's syndrome ([place]) 02/04/2018   04/16/19 rheumatology: Erosive  OA of hands, Sjogrens syndrome, low back pain at multiple sites, recurrent kidney stones, age related osteoporosis w/o current pathological fracture, f/u 6 months.    SOB (shortness of breath)    chronic due to diastolic dysfunction, deconditioning, obesity   Spinal stenosis    lumbar region    Tremor    Unstable gait 04/21/2018   Unsteadiness on feet    Urinary frequency 09/18/2018   Urinary tract infection    Visual hallucinations    Current Medications: Current Meds  Medication Sig   acetaminophen (TYLENOL) 500 MG tablet Take 500 mg by mouth See admin instructions. Take 1 tablet (500 mg) by mouth scheduled 3 times daily scheduled & 3 times daily as needed for pain (Not  to exceed 3g/24 hrs)   aspirin 81 MG chewable tablet Chew 81 mg by mouth daily.   Calcium Carb-Cholecalciferol (CALCIUM 600/VITAMIN D3) 600-800 MG-UNIT TABS Take 1 tablet by mouth daily.   carbidopa-levodopa (SINEMET IR) 25-100 MG tablet Take 1 tablet by mouth 3 (three) times daily.   Cholecalciferol (VITAMIN D) 50 MCG (2000 UT) tablet Take 2,000 Units by mouth daily.   docusate sodium (COLACE) 100 MG capsule Take 100 mg by mouth at bedtime.   famotidine (PEPCID) 20 MG tablet Take 20 mg by mouth daily.   Ferrous Sulfate (SLOW FE) 142 (45 Fe) MG TBCR Take 142 mg by mouth in the morning.   furosemide (LASIX) 20 MG tablet Take 1 tablet (20 mg total) by mouth daily.   hydroxychloroquine (PLAQUENIL) 200 MG tablet Take 200 mg by mouth daily.   lactose free nutrition (BOOST) LIQD Take by mouth 2 (two) times daily. 1/2 box; Resident likes chocolate. Twice A Day   levothyroxine (SYNTHROID, LEVOTHROID) 50 MCG tablet Take 50 mcg by mouth daily before breakfast.    loperamide (IMODIUM A-D) 2 MG tablet Take 2 mg by mouth every 6 (six) hours as needed for diarrhea or loose stools.   lubiprostone (AMITIZA) 24 MCG capsule Take 24 mcg by mouth 2 (two) times daily with a meal.    midodrine (PROAMATINE) 5 MG tablet Take 1 tablet (5 mg total) by mouth 3 (three) times daily with meals.   Multiple Vitamin (MULTIVITAMIN WITH MINERALS) TABS tablet Take 1 tablet by mouth daily.   Multiple Vitamins-Minerals (PRESERVISION AREDS 2 PO) Take 1 tablet by mouth daily.   NYAMYC powder Apply 1 Application topically daily.   pantoprazole (PROTONIX) 40 MG tablet Take 40 mg by mouth daily.   Polyethyl Glycol-Propyl Glycol (SYSTANE) 0.4-0.3 % SOLN Apply 1 drop to eye 2 (two) times daily as needed (dry eyes).    polyethylene glycol (MIRALAX / GLYCOLAX) 17 g packet Take 17 g by mouth daily.   potassium citrate (UROCIT-K) 10 MEQ (1080 MG) SR tablet Take 10 mEq by mouth 2 (two) times daily.   pramipexole (MIRAPEX) 0.125 MG tablet Take  0.125 mg by mouth at bedtime.   QUEtiapine (SEROQUEL) 25 MG tablet Take 25 mg by mouth every evening.   QUEtiapine (SEROQUEL) 25 MG tablet Take 12.5 mg by mouth every morning.   rosuvastatin (CRESTOR) 20 MG tablet Take 10 mg by mouth daily.   saccharomyces boulardii (FLORASTOR) 250 MG capsule Take 250 mg by mouth daily.   senna-docusate (SENOKOT-S) 8.6-50 MG tablet Take 1 tablet by mouth at bedtime.    sertraline (ZOLOFT) 50 MG tablet Take 100 mg by mouth at bedtime.   Skin Protectants, Misc. (EUCERIN) cream Apply 1 application topically daily.    Allergies:  Phosphate, Adrenalone, Codeine, Lactose, Lactose intolerance (gi), Latex, Lyrica [pregabalin], Morphine, Other, Plaquenil [hydroxychloroquine sulfate], Reglan [metoclopramide], Requip [ropinirole hcl], Septra [sulfamethoxazole-trimethoprim], Shellfish allergy, and Sulfa antibiotics   Social History   Tobacco Use   Smoking status: Never   Smokeless tobacco: Never  Vaping Use   Vaping Use: Never used  Substance Use Topics   Alcohol use: No    Alcohol/week: 0.0 standard drinks of alcohol   Drug use: No    Family Hx: The patient's family history includes COPD in her father; Heart attack in her mother; Heart disease in her mother; Heart failure in her mother; Hypertension in her mother; Restless legs syndrome in her mother.  Review of Systems  Gastrointestinal:  Negative for hematochezia.  Genitourinary:  Negative for hematuria.     EKGs/Labs/Other Test Reviewed:    EKG:  EKG is not ordered today.  The ekg ordered today demonstrates n/a  Recent Labs: 01/11/2022: TSH 1.67 03/31/2022: ALT 12; BUN 20; Creatinine 1.0; Hemoglobin 9.6; Platelets 248; Potassium 4.6; Sodium 140   Recent Lipid Panel Recent Labs    01/11/22 0000  CHOL 137  TRIG 184*  HDL 47  LDLCALC 64     Risk Assessment/Calculations/Metrics:              Physical Exam:    VS:  BP (!) 124/56   Pulse 84   Ht '4\' 8"'$  (1.422 m)   Wt 130 lb (59 kg)   SpO2  94%   BMI 29.15 kg/m     Wt Readings from Last 3 Encounters:  07/24/22 130 lb (59 kg)  06/20/22 130 lb 9.6 oz (59.2 kg)  06/05/22 132 lb 6.4 oz (60.1 kg)    Constitutional:      Appearance: Healthy appearance. Not in distress.  Pulmonary:     Effort: Pulmonary effort is normal.     Breath sounds: No wheezing. No rales.  Cardiovascular:     Normal rate. Regular rhythm. Normal S1. Normal S2.      Murmurs: There is a grade 3/6 crescendo-decrescendo systolic murmur at the URSB.  Edema:    Ankle: bilateral trace edema of the ankle. Abdominal:     Palpations: Abdomen is soft.  Musculoskeletal:     Cervical back: Neck supple. Skin:    General: Skin is warm and dry.  Neurological:     Mental Status: Alert.          ASSESSMENT & PLAN:   Arterial hypotension BP is improved. I do not recommend increasing her midodrine any further. She has noted some orthostatic intolerance. I suspect her BP does decrease when she is standing. I have recommended that she continue using he walker with ambulation and wearing compression stockings if she can. She needs a f/u CMET. She sees her PCP in the next couple of weeks for an annual physical and can get labs at that time.   Severe aortic stenosis - not a candidate for TAVR Mean gradient 42, Vmax 414 cm/s, DI 0.25 by echocardiogram in 2021. She notes dyspnea on exertion that continues to get worse. She is NYHA III-IIIb. I explained to her and her husband, that her severe aortic stenosis will continue to contribute to her shortness of breath. She does not seem to be volume overloaded at this time. I have asked for her SNF to continue to weigh her and notify us if her weight increases > 3 lbs in 1 day. She has not had chest pain, syncope.   (HFpEF) heart failure  with preserved ejection fraction (HCC) Volume status stable. Continue current dose of furosemide. I have written an order to notify us if her weight increases > 3 lbs in 1 day.   Anemia Hgb in  April 2023 was 9.6. I explained to her that this can also make you short of breath. I considered repeating her CBC. But as noted, she will have labs with her PCP in the next couple of weeks for an annual exam.            Dispo:  Return in about 6 months (around 01/24/2023) for Routine Follow Up w Dr. Radford Pax.   Medication Adjustments/Labs and Tests Ordered: Current medicines are reviewed at length with the patient today.  Concerns regarding medicines are outlined above.  Tests Ordered: No orders of the defined types were placed in this encounter.  Medication Changes: No orders of the defined types were placed in this encounter.  Signed, Richardson Dopp, PA-C  07/24/2022 4:17 PM    [address], [place], [place]  [address] Phone: [phone]; Fax: [phone]

## 2022-07-24 NOTE — Assessment & Plan Note (Signed)
BP is improved. I do not recommend increasing her midodrine any further. She has noted some orthostatic intolerance. I suspect her BP does decrease when she is standing. I have recommended that she continue using he walker with ambulation and wearing compression stockings if she can. She needs a f/u CMET. She sees her PCP in the next couple of weeks for an annual physical and can get labs at that time.

## 2022-07-24 NOTE — Assessment & Plan Note (Signed)
Volume status stable. Continue current dose of furosemide. I have written an order to notify us if her weight increases > 3 lbs in 1 day.

## 2022-07-24 NOTE — Assessment & Plan Note (Signed)
Mean gradient 42, Vmax 414 cm/s, DI 0.25 by echocardiogram in 2021. She notes dyspnea on exertion that continues to get worse. She is NYHA III-IIIb. I explained to her and her husband, that her severe aortic stenosis will continue to contribute to her shortness of breath. She does not seem to be volume overloaded at this time. I have asked for her SNF to continue to weigh her and notify us if her weight increases > 3 lbs in 1 day. She has not had chest pain, syncope.

## 2022-07-25 DIAGNOSIS — G20 Parkinson's disease: Secondary | ICD-10-CM | POA: Diagnosis not present

## 2022-07-25 DIAGNOSIS — M6281 Muscle weakness (generalized): Secondary | ICD-10-CM | POA: Diagnosis not present

## 2022-07-25 DIAGNOSIS — R278 Other lack of coordination: Secondary | ICD-10-CM | POA: Diagnosis not present

## 2022-07-25 DIAGNOSIS — R29898 Other symptoms and signs involving the musculoskeletal system: Secondary | ICD-10-CM | POA: Diagnosis not present

## 2022-07-25 DIAGNOSIS — R2681 Unsteadiness on feet: Secondary | ICD-10-CM | POA: Diagnosis not present

## 2022-07-26 DIAGNOSIS — R2681 Unsteadiness on feet: Secondary | ICD-10-CM | POA: Diagnosis not present

## 2022-07-26 DIAGNOSIS — M6281 Muscle weakness (generalized): Secondary | ICD-10-CM | POA: Diagnosis not present

## 2022-07-26 DIAGNOSIS — R29898 Other symptoms and signs involving the musculoskeletal system: Secondary | ICD-10-CM | POA: Diagnosis not present

## 2022-07-26 DIAGNOSIS — I10 Essential (primary) hypertension: Secondary | ICD-10-CM | POA: Diagnosis not present

## 2022-07-26 DIAGNOSIS — R278 Other lack of coordination: Secondary | ICD-10-CM | POA: Diagnosis not present

## 2022-07-26 DIAGNOSIS — G20 Parkinson's disease: Secondary | ICD-10-CM | POA: Diagnosis not present

## 2022-07-26 DIAGNOSIS — E785 Hyperlipidemia, unspecified: Secondary | ICD-10-CM | POA: Diagnosis not present

## 2022-07-27 DIAGNOSIS — G20 Parkinson's disease: Secondary | ICD-10-CM | POA: Diagnosis not present

## 2022-07-27 DIAGNOSIS — R29898 Other symptoms and signs involving the musculoskeletal system: Secondary | ICD-10-CM | POA: Diagnosis not present

## 2022-07-27 DIAGNOSIS — M6281 Muscle weakness (generalized): Secondary | ICD-10-CM | POA: Diagnosis not present

## 2022-07-27 DIAGNOSIS — R278 Other lack of coordination: Secondary | ICD-10-CM | POA: Diagnosis not present

## 2022-07-27 DIAGNOSIS — R2681 Unsteadiness on feet: Secondary | ICD-10-CM | POA: Diagnosis not present

## 2022-07-30 DIAGNOSIS — R2681 Unsteadiness on feet: Secondary | ICD-10-CM | POA: Diagnosis not present

## 2022-07-30 DIAGNOSIS — M6281 Muscle weakness (generalized): Secondary | ICD-10-CM | POA: Diagnosis not present

## 2022-07-30 DIAGNOSIS — R29898 Other symptoms and signs involving the musculoskeletal system: Secondary | ICD-10-CM | POA: Diagnosis not present

## 2022-07-30 DIAGNOSIS — R278 Other lack of coordination: Secondary | ICD-10-CM | POA: Diagnosis not present

## 2022-07-30 DIAGNOSIS — G20 Parkinson's disease: Secondary | ICD-10-CM | POA: Diagnosis not present

## 2022-07-31 DIAGNOSIS — R278 Other lack of coordination: Secondary | ICD-10-CM | POA: Diagnosis not present

## 2022-07-31 DIAGNOSIS — R2681 Unsteadiness on feet: Secondary | ICD-10-CM | POA: Diagnosis not present

## 2022-07-31 DIAGNOSIS — M6281 Muscle weakness (generalized): Secondary | ICD-10-CM | POA: Diagnosis not present

## 2022-07-31 DIAGNOSIS — G20 Parkinson's disease: Secondary | ICD-10-CM | POA: Diagnosis not present

## 2022-07-31 DIAGNOSIS — R29898 Other symptoms and signs involving the musculoskeletal system: Secondary | ICD-10-CM | POA: Diagnosis not present

## 2022-08-01 DIAGNOSIS — R278 Other lack of coordination: Secondary | ICD-10-CM | POA: Diagnosis not present

## 2022-08-01 DIAGNOSIS — R29898 Other symptoms and signs involving the musculoskeletal system: Secondary | ICD-10-CM | POA: Diagnosis not present

## 2022-08-01 DIAGNOSIS — G20 Parkinson's disease: Secondary | ICD-10-CM | POA: Diagnosis not present

## 2022-08-01 DIAGNOSIS — R2681 Unsteadiness on feet: Secondary | ICD-10-CM | POA: Diagnosis not present

## 2022-08-01 DIAGNOSIS — M6281 Muscle weakness (generalized): Secondary | ICD-10-CM | POA: Diagnosis not present

## 2022-08-03 ENCOUNTER — Non-Acute Institutional Stay (SKILLED_NURSING_FACILITY): Payer: Medicare Other | Admitting: Nurse Practitioner

## 2022-08-03 ENCOUNTER — Encounter: Payer: Self-pay | Admitting: Nurse Practitioner

## 2022-08-03 DIAGNOSIS — N390 Urinary tract infection, site not specified: Secondary | ICD-10-CM

## 2022-08-03 DIAGNOSIS — K5909 Other constipation: Secondary | ICD-10-CM | POA: Diagnosis not present

## 2022-08-03 DIAGNOSIS — I350 Nonrheumatic aortic (valve) stenosis: Secondary | ICD-10-CM

## 2022-08-03 DIAGNOSIS — E039 Hypothyroidism, unspecified: Secondary | ICD-10-CM | POA: Diagnosis not present

## 2022-08-03 DIAGNOSIS — M6281 Muscle weakness (generalized): Secondary | ICD-10-CM | POA: Diagnosis not present

## 2022-08-03 DIAGNOSIS — G20 Parkinson's disease: Secondary | ICD-10-CM | POA: Diagnosis not present

## 2022-08-03 DIAGNOSIS — E7800 Pure hypercholesterolemia, unspecified: Secondary | ICD-10-CM | POA: Diagnosis not present

## 2022-08-03 DIAGNOSIS — M154 Erosive (osteo)arthritis: Secondary | ICD-10-CM | POA: Diagnosis not present

## 2022-08-03 DIAGNOSIS — R300 Dysuria: Secondary | ICD-10-CM | POA: Diagnosis not present

## 2022-08-03 DIAGNOSIS — I5032 Chronic diastolic (congestive) heart failure: Secondary | ICD-10-CM | POA: Diagnosis not present

## 2022-08-03 DIAGNOSIS — G2581 Restless legs syndrome: Secondary | ICD-10-CM | POA: Diagnosis not present

## 2022-08-03 DIAGNOSIS — D649 Anemia, unspecified: Secondary | ICD-10-CM

## 2022-08-03 DIAGNOSIS — F323 Major depressive disorder, single episode, severe with psychotic features: Secondary | ICD-10-CM

## 2022-08-03 DIAGNOSIS — M3500 Sicca syndrome, unspecified: Secondary | ICD-10-CM | POA: Diagnosis not present

## 2022-08-03 DIAGNOSIS — K219 Gastro-esophageal reflux disease without esophagitis: Secondary | ICD-10-CM

## 2022-08-03 DIAGNOSIS — G20A1 Parkinson's disease without dyskinesia, without mention of fluctuations: Secondary | ICD-10-CM

## 2022-08-03 DIAGNOSIS — R278 Other lack of coordination: Secondary | ICD-10-CM | POA: Diagnosis not present

## 2022-08-03 DIAGNOSIS — R2681 Unsteadiness on feet: Secondary | ICD-10-CM | POA: Diagnosis not present

## 2022-08-03 DIAGNOSIS — R29898 Other symptoms and signs involving the musculoskeletal system: Secondary | ICD-10-CM | POA: Diagnosis not present

## 2022-08-03 NOTE — Progress Notes (Signed)
Location:   [facility] Room Number: [place] of Service:  SNF [phone]) Provider:  Charmion Hapke X, NP  [name], MD  Patient Care Team: [name], MD as PCP - General (Internal Medicine) [name], MD as PCP - Cardiology (Cardiology) [name], MD (Neurosurgery) [name], MD (Neurology) [name], MD (Inactive) as Consulting Physician (Gastroenterology) [name], MD as Consulting Physician (Urology) Concepcion Kirkpatrick X, NP as Nurse Practitioner (Internal Medicine) Tat, Eustace Quail, DO as Consulting Physician (Neurology)  Extended Emergency Contact Information Primary Emergency Contact: [facility] Address: [address]          [place], [place] [address] [name] of [place] Phone: [phone] Mobile Phone: [phone] Relation: Spouse Secondary Emergency Contact: [name] Mobile Phone: [phone] Relation: Daughter Preferred language: [name] Interpreter needed? No  Code Status:  DNR Goals of care: Advanced Directive information    08/03/2022   10:32 AM  Advanced Directives  Does Patient Have a Medical Advance Directive? Yes  Type of Advance Directive Out of facility DNR (pink MOST or yellow form)  Does patient want to make changes to medical advance directive? No - Patient declined  Pre-existing out of facility DNR order (yellow form or pink MOST form) Yellow form placed in chart (order not valid for inpatient use)     Chief Complaint  Patient presents with  . Medical Management of Chronic Issues  . Immunizations    Flu vaccine due    HPI:  Pt is a 88 y.o. female seen today for medical management of chronic diseases.      Dry eyes, uses artificial tears.  Prolapsed rectum, avoid constipation, usually reduced on its own.              Hx of rena lithiasis, recurrent UTI, urosepsis, treated by ID. 09/19/21 cystoscopy, laser lithotripsy, R ureteral stent/dilatation. Occasional  dysuria present. worsened dysuria, urinary frequency, urgency, and incontinence.              GERD, takes Pantoprazole, Famotidine, yearly FOBT per GI, Hgb 9.6 03/31/22             Parkinson's, takes Sinemet, MiraPex f/u Dr. Carles Collet, w/c for mobility most of time.              RLS stable on MiraPex, Sinemet hs, better sleep at night.              Depression/hallucination, takes Sertraline,  Quetiapine. Change Tramadol to prn helped. TSH 1.67 2.2.23             Constipation, takes Senokot S, Amitiza, Colace, MiraLax              Hypothyroidism, takes Levothyroxine TSH 1.67 01/11/22             Erosive arthritis, aches and pains,  takes Plaquenil, Tylenol  f/u Rheumatology.              CHF/trace edema BLE, takes Furosemide, cardiology, severe aortic valve stenosis. Echocardiogram 11/15/20 EF 60-65%. Bun/creat 20/0.95 03/30/22             Hyperlipidemia, LDL 64, on Rosuvastatin.              Anemia, takes Fe, Hgb 9.6 03/30/22             AS/CAD Cardiology palliative approach. On Midodrine per Cardiology. More noticeable DOE, fatigue.     Past Medical History:  Diagnosis Date  . Abnormal  nuclear stress test   . Actinic cheilitis 04/23/2019  . Anemia    iron deficiency   . Anxiety   . Aortic stenosis    severe by echo 2021   . Arthritis   . Broken arm    right   . Chronic diastolic CHF (congestive heart failure) ([place])   . Chronic pain   . Cognitive communication deficit   . Cognitive deficits    Communication  . Constipation   . Depression   . Erosive (osteo)arthritis 03/09/2020  . Family history of adverse reaction to anesthesia    pt. states sister vomits  . Fibromyalgia   . GERD (gastroesophageal reflux disease)   . H/O hiatal hernia   . Hallucinations, visual 07/21/2018   03/10/20 psych consult.   . Hematuria 05/18/2020  . History of bronchitis   . History of kidney stones   . Hypercholesterolemia   . Hypertension    dr t turner  . Hypothyroidism   . Memory loss 04/21/2018    08/08/18 CT head no traumatic findings, atrophy.  09/27/19 MMSE 25/30, failed the clock drawing  . Microhematuria 03/24/2015  . Mild CAD   . Neuropathy   . Osteoporosis   . Parkinson disease ([place])   . PVD (peripheral vascular disease) (HCC)    99% stenosis of left anteiror tibial artery, mod stenosis of left distal SFA and popliteal artery followed by [name]  . RBBB    noted on EKG 2018  . Restless leg syndrome   . Sepsis ([place])    hx of due to [facility]  . Septic shock ([place]) 04/19/2020  . Shortness of breath    with exertion - chronic   . Sjogren's disease ([place])   . Sjogren's syndrome [facility]) 02/04/2018   04/16/19 rheumatology: Erosive OA of hands, Sjogrens syndrome, low back pain at multiple sites, recurrent kidney stones, age related osteoporosis w/o current pathological fracture, f/u 6 months.   . SOB (shortness of breath)    chronic due to diastolic dysfunction, deconditioning, obesity  . Spinal stenosis    lumbar region   . Tremor   . Unstable gait 04/21/2018  . Unsteadiness on feet   . Urinary frequency 09/18/2018  . Urinary tract infection   . Visual hallucinations    Past Surgical History:  Procedure Laterality Date  . ABDOMINAL HYSTERECTOMY    . BACK SURGERY     4 back surgeries,   lumbar fusion  . CARDIAC CATHETERIZATION  2006   normal  . CYSTOSCOPY/URETEROSCOPY/HOLMIUM LASER/STENT PLACEMENT Left 01/06/2019   Procedure: CYSTOSCOPY/RETROGRADE/URETEROSCOPY/HOLMIUM LASER/BASKET RETRIEVAL/STENT PLACEMENT;  Surgeon: Ceasar Mons, MD;  Location: [facility];  Service: Urology;  Laterality: Left;  . CYSTOSCOPY/URETEROSCOPY/HOLMIUM LASER/STENT PLACEMENT Bilateral 05/24/2020   Procedure: CYSTOSCOPY/RETROGRADE/URETEROSCOPY/HOLMIUM LASER/STENT PLACEMENT;  Surgeon: Ceasar Mons, MD;  Location: [facility];  Service: Urology;  Laterality: Bilateral;  . CYSTOSCOPY/URETEROSCOPY/HOLMIUM LASER/STENT PLACEMENT Right 09/27/2021   Procedure: CYSTOSCOPY/BILATERAL  RETROGRADE/URETEROSCOPY/HOLMIUM LASER/STENT PLACEMENT;  Surgeon: Ceasar Mons, MD;  Location: [facility];  Service: Urology;  Laterality: Right;  ONLY NEEDS 60 MIN  . EYE SURGERY Bilateral    cateracts  . falls     variious fall, broken wrist,and toes  . FRACTURE SURGERY Right April 2016   Wrist, Pt. fell  . IR NEPHROSTOMY PLACEMENT RIGHT  04/19/2020  . kidney stone removal Left 01/20/2019  . RIGHT/LEFT HEART CATH AND CORONARY ANGIOGRAPHY N/A 02/05/2018   Procedure: RIGHT/LEFT HEART CATH AND CORONARY ANGIOGRAPHY;  Surgeon: Troy Sine, MD;  Location: [facility]  CV LAB;  Service: Cardiovascular;  Laterality: N/A;  . SPINAL CORD STIMULATOR INSERTION N/A 09/09/2015   Procedure: LUMBAR SPINAL CORD STIMULATOR INSERTION;  Surgeon: Clydell Hakim, MD;  Location: [facility] OR[facility];  Service: Neurosurgery;  Laterality: N/A;  LUMBAR SPINAL CORD STIMULATOR INSERTION  . SPINE SURGERY  April 2013   Back X's 4  . TOTAL HIP ARTHROPLASTY     right  . WRIST FRACTURE SURGERY Bilateral     Allergies  Allergen Reactions  . Phosphate     Other reaction(s): Unknown  . Adrenalone   . Codeine Other (See Comments)    Reaction:  Headaches and nightmares  Other reaction(s): Unknown  . Lactose Other (See Comments)    abd pain, lactose intolerant  . Lactose Intolerance (Gi) Nausea And Vomiting  . Latex Rash  . Lyrica [Pregabalin] Swelling and Other (See Comments)    Reaction:  Leg swelling  . Morphine Other (See Comments)  . Other Other (See Comments)    Pt states that pain medications give her nightmares.    . Plaquenil [Hydroxychloroquine Sulfate] Other (See Comments)    Reaction:  GI upset   . Reglan [Metoclopramide] Other (See Comments)    Reaction:  GI upset   . Requip [Ropinirole Hcl] Other (See Comments)    Reaction:  GI upset   . Septra [Sulfamethoxazole-Trimethoprim] Nausea And Vomiting  . Shellfish Allergy Nausea And Vomiting  . Sulfa Antibiotics Other (See Comments)    Headache, very  sick    Allergies as of 08/03/2022       Reactions   Phosphate    Other reaction(s): Unknown   Adrenalone    Codeine Other (See Comments)   Reaction:  Headaches and nightmares  Other reaction(s): Unknown   Lactose Other (See Comments)   abd pain, lactose intolerant   Lactose Intolerance (gi) Nausea And Vomiting   Latex Rash   Lyrica [pregabalin] Swelling, Other (See Comments)   Reaction:  Leg swelling   Morphine Other (See Comments)   Other Other (See Comments)   Pt states that pain medications give her nightmares.     Plaquenil [hydroxychloroquine Sulfate] Other (See Comments)   Reaction:  GI upset    Reglan [metoclopramide] Other (See Comments)   Reaction:  GI upset    Requip [ropinirole Hcl] Other (See Comments)   Reaction:  GI upset    Septra [sulfamethoxazole-trimethoprim] Nausea And Vomiting   Shellfish Allergy Nausea And Vomiting   Sulfa Antibiotics Other (See Comments)   Headache, very sick        Medication List        Accurate as of August 03, 2022 11:59 PM. If you have any questions, ask your nurse or doctor.          acetaminophen 500 MG tablet Commonly known as: TYLENOL Take 500 mg by mouth See admin instructions. Take 1 tablet (500 mg) by mouth scheduled 3 times daily scheduled & 3 times daily as needed for pain (Not to exceed 3g/24 hrs)   aspirin 81 MG chewable tablet Chew 81 mg by mouth daily.   Calcium 600/Vitamin D3 600-20 MG-MCG Tabs Generic drug: Calcium Carb-Cholecalciferol Take 1 tablet by mouth daily.   carbidopa-levodopa 25-100 MG tablet Commonly known as: SINEMET IR Take 1 tablet by mouth 3 (three) times daily.   docusate sodium 100 MG capsule Commonly known as: COLACE Take 100 mg by mouth at bedtime.   eucerin cream Apply 1 application topically daily.   famotidine 20 MG tablet Commonly  known as: PEPCID Take 20 mg by mouth daily.   furosemide 20 MG tablet Commonly known as: LASIX Take 20 mg by mouth daily as needed.    furosemide 20 MG tablet Commonly known as: LASIX Take 1 tablet (20 mg total) by mouth daily.   hydroxychloroquine 200 MG tablet Commonly known as: PLAQUENIL Take 200 mg by mouth daily.   lactose free nutrition Liqd Take by mouth 2 (two) times daily. 1/2 box; Resident likes chocolate. Twice A Day   levothyroxine 50 MCG tablet Commonly known as: SYNTHROID Take 50 mcg by mouth daily before breakfast.   loperamide 2 MG tablet Commonly known as: IMODIUM A-D Take 2 mg by mouth every 6 (six) hours as needed for diarrhea or loose stools.   lubiprostone 24 MCG capsule Commonly known as: AMITIZA Take 24 mcg by mouth 2 (two) times daily with a meal.   midodrine 5 MG tablet Commonly known as: PROAMATINE Take 1 tablet (5 mg total) by mouth 3 (three) times daily with meals.   multivitamin with minerals Tabs tablet Take 1 tablet by mouth daily.   Nyamyc powder Generic drug: nystatin Apply 1 Application topically daily.   pantoprazole 40 MG tablet Commonly known as: PROTONIX Take 40 mg by mouth daily.   polyethylene glycol 17 g packet Commonly known as: MIRALAX / GLYCOLAX Take 17 g by mouth daily.   potassium citrate 10 MEQ (1080 MG) SR tablet Commonly known as: UROCIT-K Take 10 mEq by mouth 2 (two) times daily.   pramipexole 0.125 MG tablet Commonly known as: MIRAPEX Take 0.125 mg by mouth at bedtime.   PRESERVISION AREDS 2 PO Take 1 tablet by mouth daily.   QUEtiapine 25 MG tablet Commonly known as: SEROQUEL Take 25 mg by mouth every evening.   QUEtiapine 25 MG tablet Commonly known as: SEROQUEL Take 12.5 mg by mouth every morning.   rosuvastatin 10 MG tablet Commonly known as: CRESTOR Take 10 mg by mouth daily.   saccharomyces boulardii 250 MG capsule Commonly known as: FLORASTOR Take 250 mg by mouth daily.   senna-docusate 8.6-50 MG tablet Commonly known as: Senokot-S Take 1 tablet by mouth at bedtime.   sertraline 50 MG tablet Commonly known as:  ZOLOFT Take 100 mg by mouth at bedtime.   Slow Fe 142 (45 Fe) MG Tbcr Generic drug: Ferrous Sulfate Take 142 mg by mouth in the morning.   Systane 0.4-0.3 % Soln Generic drug: Polyethyl Glycol-Propyl Glycol Apply 1 drop to eye 2 (two) times daily as needed (dry eyes).   Vitamin D 50 MCG (2000 UT) tablet Take 2,000 Units by mouth daily.        Review of Systems  Constitutional:  Negative for activity change, appetite change and fever.  HENT:  Positive for hearing loss. Negative for congestion and voice change.   Eyes:  Negative for visual disturbance.       Dry eyes.   Respiratory:  Positive for shortness of breath. Negative for cough.        Chronic DOE  Cardiovascular:  Positive for leg swelling.  Gastrointestinal:  Negative for abdominal pain, nausea and vomiting.  Genitourinary:  Positive for dysuria, frequency and urgency.       Urinary leakage   Musculoskeletal:  Positive for arthralgias, back pain, gait problem and myalgias.       Chronic lower back pain. The R 3rd finger mid knuckle pain is chronic.   Skin:  Negative for color change.  Neurological:  Positive for tremors.  Negative for speech difficulty, weakness and headaches.       Memory lapses. Minimal resting tremor in fingers, not disabling. RLS occasional symptomatic.   Psychiatric/Behavioral:  Positive for hallucinations. Negative for sleep disturbance. The patient is not nervous/anxious.     Immunization History  Administered Date(s) Administered  . Hepatitis A, Adult 03/01/1997, 09/20/1997  . Influenza Split 09/09/2013, 09/27/2014  . Influenza, High Dose Seasonal PF 08/01/2011, 08/27/2012, 10/14/2013, 09/08/2015, 09/10/2016, 09/12/2017, 09/12/2018, 09/15/2019  . Influenza,inj,Quad PF,6+ Mos 09/11/2016  . Influenza-Unspecified 09/21/2020, 09/28/2021  . Moderna Sars-Covid-2 Vaccination 12/12/2019, 01/09/2020, 10/18/2020, 05/09/2021, 08/29/2021  . Pneumococcal Conjugate-13 04/15/2014  . Pneumococcal  Polysaccharide-23 09/28/2005  . Td 06/20/2016  . Tdap 04/29/2006, 09/01/2015  . Zoster Recombinat (Shingrix) 06/11/2018, 09/10/2018  . Zoster, Live 04/29/2006   Pertinent  Health Maintenance Due  Topic Date Due  . INFLUENZA VACCINE  07/10/2022  . DEXA SCAN  Completed      08/04/2021    1:31 PM 08/09/2021   10:17 AM 09/27/2021    7:24 AM 10/10/2021   11:15 AM 03/13/2022    2:41 PM  Fall Risk  Falls in the past year? '1 1  1 '$ 0  Was there an injury with Fall? 1 1  0 0  Fall Risk Category Calculator '3 2  2 '$ 0  Fall Risk Category High Moderate  Moderate Low  Patient Fall Risk Level High fall risk High fall risk High fall risk Moderate fall risk   Patient at Risk for Falls Due to  History of fall(s)     Fall risk Follow up  Falls evaluation completed;Education provided      Functional Status Survey:    Vitals:   08/03/22 1015  BP: 94/67  Pulse: 86  Resp: 17  Temp: (!) 97.2 F (36.2 C)  SpO2: 94%  Weight: 126 lb 6.4 oz (57.3 kg)   Body mass index is 28.34 kg/m. Physical Exam Vitals and nursing note reviewed.  Constitutional:      Appearance: Normal appearance.  HENT:     Head: Normocephalic and atraumatic.     Nose: Nose normal.     Mouth/Throat:     Mouth: Mucous membranes are moist.  Eyes:     Extraocular Movements: Extraocular movements intact.     Conjunctiva/sclera: Conjunctivae normal.     Pupils: Pupils are equal, round, and reactive to light.  Cardiovascular:     Rate and Rhythm: Normal rate and regular rhythm.     Heart sounds: Murmur heard.  Pulmonary:     Effort: Pulmonary effort is normal.     Breath sounds: No rales.  Abdominal:     General: Bowel sounds are normal. There is no distension.     Palpations: Abdomen is soft.     Tenderness: There is no abdominal tenderness. There is no right CVA tenderness, left CVA tenderness, guarding or rebound.  Genitourinary:    Comments: Hx of reported protruded rectum was reduced on its own, a external hemorrhoid  at 7pm, no s/s of injury.  Musculoskeletal:        General: Tenderness present.     Cervical back: Normal range of motion and neck supple.     Right lower leg: Edema present.     Left lower leg: Edema present.     Comments: Trace edema BLE.  Arthritic changes in fingers, R>L. The R 3rd finger mid knuckle swelling, no heat or redness. Chronic lower back pain.   Skin:    General: Skin is warm  and dry.  Neurological:     General: No focal deficit present.     Mental Status: She is alert. Mental status is at baseline.     Gait: Gait abnormal.     Comments: Oriented to person, place. Ambulates with walker.   Psychiatric:        Mood and Affect: Mood normal.        Behavior: Behavior normal.     Comments: Visual hallucination vs delusion    Labs reviewed: Recent Labs    09/27/21 0720 01/11/22 0000 03/31/22 0000  NA 140 141 140  K 4.3 4.4 4.6  CL 108 106 108  CO2 24 24* 23*  GLUCOSE 90  --   --   BUN '14 20 20  '$ CREATININE 0.85 1.0 1.0  CALCIUM 9.4 9.5 9.4   Recent Labs    01/11/22 0000 03/31/22 0000  AST  --  18  ALT  --  12  ALKPHOS  --  76  ALBUMIN 3.8 3.6   Recent Labs    09/27/21 0720 09/27/21 0720 10/05/21 0000 10/09/21 0000 11/23/21 0000 03/31/22 0000  WBC 5.0   < > 5.1 5.4 6.9 8.8  NEUTROABS  --   --  2,739.00 2,538.00  --  5,342.00  HGB 8.9*  --  8.0* 8.0* 10.1* 9.6*  HCT 29.4*  --  26* 26* 31* 30*  MCV 98.3  --   --   --   --   --   PLT 307  --  253 281 268 248   < > = values in this interval not displayed.   Lab Results  Component Value Date   TSH 1.67 01/11/2022   No results found for: "HGBA1C" Lab Results  Component Value Date   CHOL 137 01/11/2022   HDL 47 01/11/2022   LDLCALC 64 01/11/2022   TRIG 184 (A) 01/11/2022   CHOLHDL 2.4 07/14/2019    Significant Diagnostic Results in last 30 days:  No results found.  Assessment/Plan Recurrent UTI Hx of rena lithiasis, recurrent UTI, urosepsis, treated by ID. 09/19/21 cystoscopy, laser  lithotripsy, R ureteral stent/dilatation. Occasional dysuria present. worsened dysuria, urinary frequency, urgency, and incontinence.  The patient denied abd pain, fever, or lower back pain Stat UA C/S 08/03/22 UA wbc 10-20, rbc 40-60, negative nitrite, no bacteria, culture shoed E. Faecalis, 50,000-100,000c/ml, Hx of kidney stone, last seen urology 03/2022.  Dr. Lyndel Safe: monitor for s/s of UTI vs kidney stone, may f/u Urology.   Sjogren's syndrome ([place]) uses artificial tears.   GERD (gastroesophageal reflux disease)  takes Pantoprazole, Famotidine, yearly FOBT per GI, Hgb 9.6 03/31/22  Parkinson's disease ([place]) takes Sinemet, MiraPex f/u Dr. Carles Collet, w/c for mobility most of time.   Restless leg syndrome  stable on MiraPex, Sinemet hs, better sleep at night.  Depression, psychotic ([place]) Depression/hallucination, takes Sertraline,  Quetiapine. Change Tramadol to prn helped. TSH 1.67 2.2.23  Chronic constipation Stable,  takes Senokot S, Amitiza, Colace, MiraLax   Hypothyroidism takes Levothyroxine TSH 1.67 01/11/22  Erosive (osteo)arthritis aches and pains,  takes Plaquenil, Tylenol  f/u Rheumatology.   (HFpEF) heart failure with preserved ejection fraction (HCC) CHF/trace edema BLE, takes Furosemide, cardiology, severe aortic valve stenosis. Echocardiogram 11/15/20 EF 60-65%. Bun/creat 20/0.95 03/30/22  Hypercholesterolemia LDL 64, on Rosuvastatin.   Anemia takes Fe, Hgb 9.6 03/30/22  Severe aortic stenosis - not a candidate for TAVR  AS/CAD Cardiology palliative approach. On Midodrine per Cardiology. More noticeable DOE, fatigue.      Family/  staff Communication: plan of care reviewed with the patient and charge nurse.   Labs/tests ordered:  UA C/S  Time spend 35 minutes.

## 2022-08-03 NOTE — Assessment & Plan Note (Signed)
uses artificial tears.

## 2022-08-03 NOTE — Assessment & Plan Note (Signed)
AS/CAD Cardiology palliative approach. On Midodrine per Cardiology. More noticeable DOE, fatigue.  

## 2022-08-03 NOTE — Assessment & Plan Note (Signed)
aches and pains,  takes Plaquenil, Tylenol  f/u Rheumatology.  

## 2022-08-03 NOTE — Assessment & Plan Note (Signed)
takes Levothyroxine TSH 1.67 01/11/22

## 2022-08-03 NOTE — Assessment & Plan Note (Signed)
Hx of rena lithiasis, recurrent UTI, urosepsis, treated by ID. 09/19/21 cystoscopy, laser lithotripsy, R ureteral stent/dilatation. Occasional dysuria present. worsened dysuria, urinary frequency, urgency, and incontinence.  The patient denied abd pain, fever, or lower back pain Stat UA C/S 08/03/22 UA wbc 10-20, rbc 40-60, negative nitrite, no bacteria, culture shoed E. Faecalis, 50,000-100,000c/ml, Hx of kidney stone, last seen urology 03/2022.  Dr. Lyndel Safe: monitor for s/s of UTI vs kidney stone, may f/u Urology.

## 2022-08-03 NOTE — Assessment & Plan Note (Signed)
Stable,  takes Senokot S, Amitiza, Colace, MiraLax  

## 2022-08-03 NOTE — Assessment & Plan Note (Signed)
CHF/trace edema BLE, takes Furosemide, cardiology, severe aortic valve stenosis. Echocardiogram 11/15/20 EF 60-65%. Bun/creat 20/0.95 03/30/22

## 2022-08-03 NOTE — Assessment & Plan Note (Signed)
takes Pantoprazole, Famotidine, yearly FOBT per GI, Hgb 9.6 03/31/22

## 2022-08-03 NOTE — Assessment & Plan Note (Signed)
stable on MiraPex, Sinemet hs, better sleep at night.  

## 2022-08-03 NOTE — Assessment & Plan Note (Signed)
LDL 64, on Rosuvastatin.

## 2022-08-03 NOTE — Assessment & Plan Note (Signed)
takes Fe, Hgb 9.6 03/30/22

## 2022-08-03 NOTE — Assessment & Plan Note (Signed)
Depression/hallucination, takes Sertraline,  Quetiapine. Change Tramadol to prn helped. TSH 1.67 2.2.23

## 2022-08-03 NOTE — Assessment & Plan Note (Signed)
takes Sinemet, MiraPex f/u Dr. Tat, w/c for mobility most of time.  

## 2022-08-06 DIAGNOSIS — R2681 Unsteadiness on feet: Secondary | ICD-10-CM | POA: Diagnosis not present

## 2022-08-06 DIAGNOSIS — R29898 Other symptoms and signs involving the musculoskeletal system: Secondary | ICD-10-CM | POA: Diagnosis not present

## 2022-08-06 DIAGNOSIS — F411 Generalized anxiety disorder: Secondary | ICD-10-CM | POA: Diagnosis not present

## 2022-08-06 DIAGNOSIS — F323 Major depressive disorder, single episode, severe with psychotic features: Secondary | ICD-10-CM | POA: Diagnosis not present

## 2022-08-06 DIAGNOSIS — M6281 Muscle weakness (generalized): Secondary | ICD-10-CM | POA: Diagnosis not present

## 2022-08-06 DIAGNOSIS — G20 Parkinson's disease: Secondary | ICD-10-CM | POA: Diagnosis not present

## 2022-08-06 DIAGNOSIS — R278 Other lack of coordination: Secondary | ICD-10-CM | POA: Diagnosis not present

## 2022-08-06 DIAGNOSIS — R441 Visual hallucinations: Secondary | ICD-10-CM | POA: Diagnosis not present

## 2022-08-07 DIAGNOSIS — R278 Other lack of coordination: Secondary | ICD-10-CM | POA: Diagnosis not present

## 2022-08-07 DIAGNOSIS — R2681 Unsteadiness on feet: Secondary | ICD-10-CM | POA: Diagnosis not present

## 2022-08-07 DIAGNOSIS — R29898 Other symptoms and signs involving the musculoskeletal system: Secondary | ICD-10-CM | POA: Diagnosis not present

## 2022-08-07 DIAGNOSIS — M6281 Muscle weakness (generalized): Secondary | ICD-10-CM | POA: Diagnosis not present

## 2022-08-07 DIAGNOSIS — G20 Parkinson's disease: Secondary | ICD-10-CM | POA: Diagnosis not present

## 2022-08-08 DIAGNOSIS — M6281 Muscle weakness (generalized): Secondary | ICD-10-CM | POA: Diagnosis not present

## 2022-08-08 DIAGNOSIS — R2681 Unsteadiness on feet: Secondary | ICD-10-CM | POA: Diagnosis not present

## 2022-08-08 DIAGNOSIS — R29898 Other symptoms and signs involving the musculoskeletal system: Secondary | ICD-10-CM | POA: Diagnosis not present

## 2022-08-08 DIAGNOSIS — R278 Other lack of coordination: Secondary | ICD-10-CM | POA: Diagnosis not present

## 2022-08-08 DIAGNOSIS — G20 Parkinson's disease: Secondary | ICD-10-CM | POA: Diagnosis not present

## 2022-08-10 DIAGNOSIS — M6281 Muscle weakness (generalized): Secondary | ICD-10-CM | POA: Diagnosis not present

## 2022-08-10 DIAGNOSIS — R29898 Other symptoms and signs involving the musculoskeletal system: Secondary | ICD-10-CM | POA: Diagnosis not present

## 2022-08-10 DIAGNOSIS — G20 Parkinson's disease: Secondary | ICD-10-CM | POA: Diagnosis not present

## 2022-08-10 DIAGNOSIS — R2681 Unsteadiness on feet: Secondary | ICD-10-CM | POA: Diagnosis not present

## 2022-08-10 DIAGNOSIS — R278 Other lack of coordination: Secondary | ICD-10-CM | POA: Diagnosis not present

## 2022-08-13 DIAGNOSIS — R278 Other lack of coordination: Secondary | ICD-10-CM | POA: Diagnosis not present

## 2022-08-13 DIAGNOSIS — R29898 Other symptoms and signs involving the musculoskeletal system: Secondary | ICD-10-CM | POA: Diagnosis not present

## 2022-08-13 DIAGNOSIS — M6281 Muscle weakness (generalized): Secondary | ICD-10-CM | POA: Diagnosis not present

## 2022-08-13 DIAGNOSIS — R2681 Unsteadiness on feet: Secondary | ICD-10-CM | POA: Diagnosis not present

## 2022-08-13 DIAGNOSIS — G20 Parkinson's disease: Secondary | ICD-10-CM | POA: Diagnosis not present

## 2022-08-14 DIAGNOSIS — G20 Parkinson's disease: Secondary | ICD-10-CM | POA: Diagnosis not present

## 2022-08-14 DIAGNOSIS — M6281 Muscle weakness (generalized): Secondary | ICD-10-CM | POA: Diagnosis not present

## 2022-08-14 DIAGNOSIS — R278 Other lack of coordination: Secondary | ICD-10-CM | POA: Diagnosis not present

## 2022-08-14 DIAGNOSIS — R29898 Other symptoms and signs involving the musculoskeletal system: Secondary | ICD-10-CM | POA: Diagnosis not present

## 2022-08-14 DIAGNOSIS — R2681 Unsteadiness on feet: Secondary | ICD-10-CM | POA: Diagnosis not present

## 2022-08-16 DIAGNOSIS — R2681 Unsteadiness on feet: Secondary | ICD-10-CM | POA: Diagnosis not present

## 2022-08-16 DIAGNOSIS — G20 Parkinson's disease: Secondary | ICD-10-CM | POA: Diagnosis not present

## 2022-08-16 DIAGNOSIS — R29898 Other symptoms and signs involving the musculoskeletal system: Secondary | ICD-10-CM | POA: Diagnosis not present

## 2022-08-16 DIAGNOSIS — M6281 Muscle weakness (generalized): Secondary | ICD-10-CM | POA: Diagnosis not present

## 2022-08-16 DIAGNOSIS — R278 Other lack of coordination: Secondary | ICD-10-CM | POA: Diagnosis not present

## 2022-08-17 DIAGNOSIS — R2681 Unsteadiness on feet: Secondary | ICD-10-CM | POA: Diagnosis not present

## 2022-08-17 DIAGNOSIS — R29898 Other symptoms and signs involving the musculoskeletal system: Secondary | ICD-10-CM | POA: Diagnosis not present

## 2022-08-17 DIAGNOSIS — R278 Other lack of coordination: Secondary | ICD-10-CM | POA: Diagnosis not present

## 2022-08-17 DIAGNOSIS — M6281 Muscle weakness (generalized): Secondary | ICD-10-CM | POA: Diagnosis not present

## 2022-08-17 DIAGNOSIS — G20 Parkinson's disease: Secondary | ICD-10-CM | POA: Diagnosis not present

## 2022-08-20 DIAGNOSIS — M6281 Muscle weakness (generalized): Secondary | ICD-10-CM | POA: Diagnosis not present

## 2022-08-20 DIAGNOSIS — R29898 Other symptoms and signs involving the musculoskeletal system: Secondary | ICD-10-CM | POA: Diagnosis not present

## 2022-08-20 DIAGNOSIS — R2681 Unsteadiness on feet: Secondary | ICD-10-CM | POA: Diagnosis not present

## 2022-08-20 DIAGNOSIS — G20 Parkinson's disease: Secondary | ICD-10-CM | POA: Diagnosis not present

## 2022-08-20 DIAGNOSIS — R278 Other lack of coordination: Secondary | ICD-10-CM | POA: Diagnosis not present

## 2022-08-21 DIAGNOSIS — G20 Parkinson's disease: Secondary | ICD-10-CM | POA: Diagnosis not present

## 2022-08-21 DIAGNOSIS — R278 Other lack of coordination: Secondary | ICD-10-CM | POA: Diagnosis not present

## 2022-08-21 DIAGNOSIS — M6281 Muscle weakness (generalized): Secondary | ICD-10-CM | POA: Diagnosis not present

## 2022-08-21 DIAGNOSIS — R2681 Unsteadiness on feet: Secondary | ICD-10-CM | POA: Diagnosis not present

## 2022-08-21 DIAGNOSIS — R29898 Other symptoms and signs involving the musculoskeletal system: Secondary | ICD-10-CM | POA: Diagnosis not present

## 2022-08-22 DIAGNOSIS — M6281 Muscle weakness (generalized): Secondary | ICD-10-CM | POA: Diagnosis not present

## 2022-08-22 DIAGNOSIS — R278 Other lack of coordination: Secondary | ICD-10-CM | POA: Diagnosis not present

## 2022-08-22 DIAGNOSIS — R2681 Unsteadiness on feet: Secondary | ICD-10-CM | POA: Diagnosis not present

## 2022-08-22 DIAGNOSIS — R29898 Other symptoms and signs involving the musculoskeletal system: Secondary | ICD-10-CM | POA: Diagnosis not present

## 2022-08-22 DIAGNOSIS — G20 Parkinson's disease: Secondary | ICD-10-CM | POA: Diagnosis not present

## 2022-08-23 DIAGNOSIS — G20 Parkinson's disease: Secondary | ICD-10-CM | POA: Diagnosis not present

## 2022-08-23 DIAGNOSIS — R2681 Unsteadiness on feet: Secondary | ICD-10-CM | POA: Diagnosis not present

## 2022-08-23 DIAGNOSIS — R278 Other lack of coordination: Secondary | ICD-10-CM | POA: Diagnosis not present

## 2022-08-23 DIAGNOSIS — R29898 Other symptoms and signs involving the musculoskeletal system: Secondary | ICD-10-CM | POA: Diagnosis not present

## 2022-08-23 DIAGNOSIS — M6281 Muscle weakness (generalized): Secondary | ICD-10-CM | POA: Diagnosis not present

## 2022-08-24 DIAGNOSIS — M6281 Muscle weakness (generalized): Secondary | ICD-10-CM | POA: Diagnosis not present

## 2022-08-24 DIAGNOSIS — R2681 Unsteadiness on feet: Secondary | ICD-10-CM | POA: Diagnosis not present

## 2022-08-24 DIAGNOSIS — R278 Other lack of coordination: Secondary | ICD-10-CM | POA: Diagnosis not present

## 2022-08-24 DIAGNOSIS — R29898 Other symptoms and signs involving the musculoskeletal system: Secondary | ICD-10-CM | POA: Diagnosis not present

## 2022-08-24 DIAGNOSIS — G20 Parkinson's disease: Secondary | ICD-10-CM | POA: Diagnosis not present

## 2022-08-27 DIAGNOSIS — R2681 Unsteadiness on feet: Secondary | ICD-10-CM | POA: Diagnosis not present

## 2022-08-27 DIAGNOSIS — R278 Other lack of coordination: Secondary | ICD-10-CM | POA: Diagnosis not present

## 2022-08-27 DIAGNOSIS — R29898 Other symptoms and signs involving the musculoskeletal system: Secondary | ICD-10-CM | POA: Diagnosis not present

## 2022-08-27 DIAGNOSIS — M6281 Muscle weakness (generalized): Secondary | ICD-10-CM | POA: Diagnosis not present

## 2022-08-27 DIAGNOSIS — G20 Parkinson's disease: Secondary | ICD-10-CM | POA: Diagnosis not present

## 2022-08-28 DIAGNOSIS — M6281 Muscle weakness (generalized): Secondary | ICD-10-CM | POA: Diagnosis not present

## 2022-08-28 DIAGNOSIS — R29898 Other symptoms and signs involving the musculoskeletal system: Secondary | ICD-10-CM | POA: Diagnosis not present

## 2022-08-28 DIAGNOSIS — R2681 Unsteadiness on feet: Secondary | ICD-10-CM | POA: Diagnosis not present

## 2022-08-28 DIAGNOSIS — R278 Other lack of coordination: Secondary | ICD-10-CM | POA: Diagnosis not present

## 2022-08-28 DIAGNOSIS — G20 Parkinson's disease: Secondary | ICD-10-CM | POA: Diagnosis not present

## 2022-08-29 DIAGNOSIS — G20 Parkinson's disease: Secondary | ICD-10-CM | POA: Diagnosis not present

## 2022-08-29 DIAGNOSIS — M6281 Muscle weakness (generalized): Secondary | ICD-10-CM | POA: Diagnosis not present

## 2022-08-29 DIAGNOSIS — R278 Other lack of coordination: Secondary | ICD-10-CM | POA: Diagnosis not present

## 2022-08-29 DIAGNOSIS — R2681 Unsteadiness on feet: Secondary | ICD-10-CM | POA: Diagnosis not present

## 2022-08-29 DIAGNOSIS — R29898 Other symptoms and signs involving the musculoskeletal system: Secondary | ICD-10-CM | POA: Diagnosis not present

## 2022-08-31 DIAGNOSIS — R29898 Other symptoms and signs involving the musculoskeletal system: Secondary | ICD-10-CM | POA: Diagnosis not present

## 2022-08-31 DIAGNOSIS — R278 Other lack of coordination: Secondary | ICD-10-CM | POA: Diagnosis not present

## 2022-08-31 DIAGNOSIS — R2681 Unsteadiness on feet: Secondary | ICD-10-CM | POA: Diagnosis not present

## 2022-08-31 DIAGNOSIS — M6281 Muscle weakness (generalized): Secondary | ICD-10-CM | POA: Diagnosis not present

## 2022-08-31 DIAGNOSIS — G20 Parkinson's disease: Secondary | ICD-10-CM | POA: Diagnosis not present

## 2022-09-03 DIAGNOSIS — R2681 Unsteadiness on feet: Secondary | ICD-10-CM | POA: Diagnosis not present

## 2022-09-03 DIAGNOSIS — F323 Major depressive disorder, single episode, severe with psychotic features: Secondary | ICD-10-CM | POA: Diagnosis not present

## 2022-09-03 DIAGNOSIS — R278 Other lack of coordination: Secondary | ICD-10-CM | POA: Diagnosis not present

## 2022-09-03 DIAGNOSIS — M6281 Muscle weakness (generalized): Secondary | ICD-10-CM | POA: Diagnosis not present

## 2022-09-03 DIAGNOSIS — F411 Generalized anxiety disorder: Secondary | ICD-10-CM | POA: Diagnosis not present

## 2022-09-03 DIAGNOSIS — G20 Parkinson's disease: Secondary | ICD-10-CM | POA: Diagnosis not present

## 2022-09-03 DIAGNOSIS — R441 Visual hallucinations: Secondary | ICD-10-CM | POA: Diagnosis not present

## 2022-09-03 DIAGNOSIS — R29898 Other symptoms and signs involving the musculoskeletal system: Secondary | ICD-10-CM | POA: Diagnosis not present

## 2022-09-04 ENCOUNTER — Non-Acute Institutional Stay (SKILLED_NURSING_FACILITY): Payer: Medicare Other | Admitting: Family Medicine

## 2022-09-04 DIAGNOSIS — N1831 Chronic kidney disease, stage 3a: Secondary | ICD-10-CM | POA: Diagnosis not present

## 2022-09-04 DIAGNOSIS — R441 Visual hallucinations: Secondary | ICD-10-CM | POA: Diagnosis not present

## 2022-09-04 DIAGNOSIS — M25472 Effusion, left ankle: Secondary | ICD-10-CM | POA: Diagnosis not present

## 2022-09-04 DIAGNOSIS — I5032 Chronic diastolic (congestive) heart failure: Secondary | ICD-10-CM

## 2022-09-04 DIAGNOSIS — G20 Parkinson's disease: Secondary | ICD-10-CM | POA: Diagnosis not present

## 2022-09-04 DIAGNOSIS — E039 Hypothyroidism, unspecified: Secondary | ICD-10-CM | POA: Diagnosis not present

## 2022-09-04 DIAGNOSIS — M25471 Effusion, right ankle: Secondary | ICD-10-CM | POA: Diagnosis not present

## 2022-09-04 DIAGNOSIS — R011 Cardiac murmur, unspecified: Secondary | ICD-10-CM

## 2022-09-04 DIAGNOSIS — D649 Anemia, unspecified: Secondary | ICD-10-CM

## 2022-09-04 DIAGNOSIS — F323 Major depressive disorder, single episode, severe with psychotic features: Secondary | ICD-10-CM | POA: Diagnosis not present

## 2022-09-04 DIAGNOSIS — F411 Generalized anxiety disorder: Secondary | ICD-10-CM | POA: Diagnosis not present

## 2022-09-04 NOTE — Progress Notes (Signed)
Provider:  Alain Honey, MD Location:      Place of Service:     PCP: [name], MD Patient Care Team: [name], MD as PCP - General (Internal Medicine) [name], MD as PCP - Cardiology (Cardiology) [name], MD (Neurosurgery) [name], MD (Neurology) [name], MD (Inactive) as Consulting Physician (Gastroenterology) [name], MD as Consulting Physician (Urology) [name], Man X, NP as Nurse Practitioner (Internal Medicine) Tat, Eustace Quail, DO as Consulting Physician (Neurology)  Extended Emergency Contact Information Primary Emergency Contact: [facility] Address: [address]          [place], [place] [address] [name] of [place] Phone: [phone] Mobile Phone: [phone] Relation: Spouse Secondary Emergency Contact: [name] Mobile Phone: [phone] Relation: Daughter Preferred language: [name] Interpreter needed? No  Code Status:  Goals of Care: Advanced Directive information    08/03/2022   10:32 AM  Advanced Directives  Does Patient Have a Medical Advance Directive? Yes  Type of Advance Directive Out of facility DNR (pink MOST or yellow form)  Does patient want to make changes to medical advance directive? No - Patient declined  Pre-existing out of facility DNR order (yellow form or pink MOST form) Yellow form placed in chart (order not valid for inpatient use)      No chief complaint on file.   HPI: Patient is a 88 y.o. female seen today for chronic problems including shortness of breath, aortic stenosis, high blood pressure, chronic kidney disease. Today I visited her in her room.  When asked how she is doing she tells me about shortness of breath.  In reviewing her records this has been a chronic problem.  She tells me she has an upcoming appointment with pulmonary but she also has a history of aortic stenosis and that has felt to be a big source of her shortness of breath in the  past.  She also has some mild to moderate anemia which is a contributor. She describes some edema in her ankles and feet and relates it to infection in her great toes.  She does see the podiatrist.  I examined the toes and do not see any significant infection so I doubt that any pedal edema is related. She continues to be a resident here in skilled nursing while her husband who had previously been admitted here after a hospitalization has gone back to their apartment in assisted living.  Past Medical History:  Diagnosis Date   Abnormal nuclear stress test    Actinic cheilitis 04/23/2019   Anemia    iron deficiency    Anxiety    Aortic stenosis    severe by echo 2021    Arthritis    Broken arm    right    Chronic diastolic CHF (congestive heart failure) ([place])    Chronic pain    Cognitive communication deficit    Cognitive deficits    Communication   Constipation    Depression    Erosive (osteo)arthritis 03/09/2020   Family history of adverse reaction to anesthesia    pt. states sister vomits   Fibromyalgia    GERD (gastroesophageal reflux disease)    H/O hiatal hernia    Hallucinations, visual 07/21/2018   03/10/20 psych consult.    Hematuria 05/18/2020   History of bronchitis    History of kidney stones    Hypercholesterolemia    Hypertension    dr t turner   Hypothyroidism    Memory loss  04/21/2018   08/08/18 CT head no traumatic findings, atrophy.  09/27/19 MMSE 25/30, failed the clock drawing   Microhematuria 03/24/2015   Mild CAD    Neuropathy    Osteoporosis    Parkinson disease (HCC)    PVD (peripheral vascular disease) (HCC)    99% stenosis of left anteiror tibial artery, mod stenosis of left distal SFA and popliteal artery followed by [name]   RBBB    noted on EKG 2018   Restless leg syndrome    Sepsis ([place])    hx of due to Ecoli   Septic shock ([place]) 04/19/2020   Shortness of breath    with exertion - chronic    Sjogren's disease ([place])    Sjogren's  syndrome ([place]) 02/04/2018   04/16/19 rheumatology: Erosive OA of hands, Sjogrens syndrome, low back pain at multiple sites, recurrent kidney stones, age related osteoporosis w/o current pathological fracture, f/u 6 months.    SOB (shortness of breath)    chronic due to diastolic dysfunction, deconditioning, obesity   Spinal stenosis    lumbar region    Tremor    Unstable gait 04/21/2018   Unsteadiness on feet    Urinary frequency 09/18/2018   Urinary tract infection    Visual hallucinations    Past Surgical History:  Procedure Laterality Date   ABDOMINAL HYSTERECTOMY     BACK SURGERY     4 back surgeries,   lumbar fusion   CARDIAC CATHETERIZATION  2006   normal   CYSTOSCOPY/URETEROSCOPY/HOLMIUM LASER/STENT PLACEMENT Left 01/06/2019   Procedure: CYSTOSCOPY/RETROGRADE/URETEROSCOPY/HOLMIUM LASER/BASKET RETRIEVAL/STENT PLACEMENT;  Surgeon: Ceasar Mons, MD;  Location: [facility];  Service: Urology;  Laterality: Left;   CYSTOSCOPY/URETEROSCOPY/HOLMIUM LASER/STENT PLACEMENT Bilateral 05/24/2020   Procedure: CYSTOSCOPY/RETROGRADE/URETEROSCOPY/HOLMIUM LASER/STENT PLACEMENT;  Surgeon: Ceasar Mons, MD;  Location: [facility];  Service: Urology;  Laterality: Bilateral;   CYSTOSCOPY/URETEROSCOPY/HOLMIUM LASER/STENT PLACEMENT Right 09/27/2021   Procedure: CYSTOSCOPY/BILATERAL RETROGRADE/URETEROSCOPY/HOLMIUM LASER/STENT PLACEMENT;  Surgeon: Ceasar Mons, MD;  Location: [facility];  Service: Urology;  Laterality: Right;  ONLY NEEDS 60 MIN   EYE SURGERY Bilateral    cateracts   falls     variious fall, broken wrist,and toes   FRACTURE SURGERY Right April 2016   Wrist, Pt. fell   IR NEPHROSTOMY PLACEMENT RIGHT  04/19/2020   kidney stone removal Left 01/20/2019   RIGHT/LEFT HEART CATH AND CORONARY ANGIOGRAPHY N/A 02/05/2018   Procedure: RIGHT/LEFT HEART CATH AND CORONARY ANGIOGRAPHY;  Surgeon: Troy Sine, MD;  Location: [facility];  Service: Cardiovascular;   Laterality: N/A;   SPINAL CORD STIMULATOR INSERTION N/A 09/09/2015   Procedure: LUMBAR SPINAL CORD STIMULATOR INSERTION;  Surgeon: Clydell Hakim, MD;  Location: [facility];  Service: Neurosurgery;  Laterality: N/A;  LUMBAR SPINAL CORD STIMULATOR INSERTION   SPINE SURGERY  April 2013   Back X's 4   TOTAL HIP ARTHROPLASTY     right   WRIST FRACTURE SURGERY Bilateral     reports that she has never smoked. She has never used smokeless tobacco. She reports that she does not drink alcohol and does not use drugs. Social History   Socioeconomic History   Marital status: Married    Spouse name: [name]   Number of children: 2   Years of education: HS   Highest education level: Not on file  Occupational History    Comment: Homemaker  Tobacco Use   Smoking status: Never   Smokeless tobacco: Never  Vaping Use   Vaping Use: Never used  Substance and Sexual Activity   Alcohol use: No    Alcohol/week: 0.0 standard drinks of alcohol   Drug use: No   Sexual activity: Never  Other Topics Concern   Not on file  Social History Narrative   Patient lives at [facility] home with her spouse. Married in 1957. Two people lives in the home, no pets.    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    Caffeine Use: tea   Social Determinants of Health   Financial Resource Strain: Low Risk  (10/27/2018)   Overall Financial Resource Strain (CARDIA)    Difficulty of Paying Living Expenses: Not hard at all  Food Insecurity: No Food Insecurity (10/27/2018)   Hunger Vital Sign    Worried About Running Out of Food in the Last Year: Never true    Ran Out of Food in the Last Year: Never true  Transportation Needs: No Transportation Needs (10/27/2018)   PRAPARE - Hydrologist (Medical): No    Lack of Transportation (Non-Medical): No  Physical Activity: Insufficiently Active (10/27/2018)   Exercise Vital Sign    Days of Exercise  per Week: 2 days    Minutes of Exercise per Session: 30 min  Stress: No Stress Concern Present (10/27/2018)   [place]    Feeling of Stress : Only a little  Social Connections: Socially Integrated (10/27/2018)   Social Connection and Isolation Panel [NHANES]    Frequency of Communication with Friends and Family: More than three times a week    Frequency of Social Gatherings with Friends and Family: More than three times a week    Attends Religious Services: More than 4 times per year    Active Member of Clubs or Organizations: Yes    Attends Archivist Meetings: More than 4 times per year    Marital Status: Married  Human resources officer Violence: Not At Risk (10/27/2018)   Humiliation, Afraid, Rape, and Kick questionnaire    Fear of Current or Ex-Partner: No    Emotionally Abused: No    Physically Abused: No    Sexually Abused: No    Functional Status Survey:    Family History  Problem Relation Age of Onset   Heart attack Mother    Restless legs syndrome Mother    Heart failure Mother    Heart disease Mother    Hypertension Mother    COPD Father     Health Maintenance  Topic Date Due   INFLUENZA VACCINE  07/10/2022   COVID-19 Vaccine (6 - Moderna risk series) 10/25/2022 (Originally 10/24/2021)   TETANUS/TDAP  06/20/2026   Pneumonia Vaccine 75+ Years old  Completed   DEXA SCAN  Completed   Zoster Vaccines- Shingrix  Completed   HPV VACCINES  Aged Out    Allergies  Allergen Reactions   Phosphate     Other reaction(s): Unknown   Adrenalone    Codeine Other (See Comments)    Reaction:  Headaches and nightmares  Other reaction(s): Unknown   Lactose Other (See Comments)    abd pain, lactose intolerant   Lactose Intolerance (Gi) Nausea And Vomiting   Latex Rash   Lyrica [Pregabalin] Swelling and Other (See Comments)    Reaction:  Leg swelling   Morphine Other (See Comments)   Other Other  (See Comments)    Pt states that pain medications give her nightmares.  Plaquenil [Hydroxychloroquine Sulfate] Other (See Comments)    Reaction:  GI upset    Reglan [Metoclopramide] Other (See Comments)    Reaction:  GI upset    Requip [Ropinirole Hcl] Other (See Comments)    Reaction:  GI upset    Septra [Sulfamethoxazole-Trimethoprim] Nausea And Vomiting   Shellfish Allergy Nausea And Vomiting   Sulfa Antibiotics Other (See Comments)    Headache, very sick    Outpatient Encounter Medications as of 09/04/2022  Medication Sig   acetaminophen (TYLENOL) 500 MG tablet Take 500 mg by mouth See admin instructions. Take 1 tablet (500 mg) by mouth scheduled 3 times daily scheduled & 3 times daily as needed for pain (Not to exceed 3g/24 hrs)   aspirin 81 MG chewable tablet Chew 81 mg by mouth daily.   Calcium Carb-Cholecalciferol (CALCIUM 600/VITAMIN D3) 600-800 MG-UNIT TABS Take 1 tablet by mouth daily.   carbidopa-levodopa (SINEMET IR) 25-100 MG tablet Take 1 tablet by mouth 3 (three) times daily.   Cholecalciferol (VITAMIN D) 50 MCG (2000 UT) tablet Take 2,000 Units by mouth daily.   docusate sodium (COLACE) 100 MG capsule Take 100 mg by mouth at bedtime.   famotidine (PEPCID) 20 MG tablet Take 20 mg by mouth daily.   Ferrous Sulfate (SLOW FE) 142 (45 Fe) MG TBCR Take 142 mg by mouth in the morning.   furosemide (LASIX) 20 MG tablet Take 1 tablet (20 mg total) by mouth daily.   furosemide (LASIX) 20 MG tablet Take 20 mg by mouth daily as needed.   hydroxychloroquine (PLAQUENIL) 200 MG tablet Take 200 mg by mouth daily.   lactose free nutrition (BOOST) LIQD Take by mouth 2 (two) times daily. 1/2 box; Resident likes chocolate. Twice A Day   levothyroxine (SYNTHROID, LEVOTHROID) 50 MCG tablet Take 50 mcg by mouth daily before breakfast.    loperamide (IMODIUM A-D) 2 MG tablet Take 2 mg by mouth every 6 (six) hours as needed for diarrhea or loose stools.   lubiprostone (AMITIZA) 24 MCG  capsule Take 24 mcg by mouth 2 (two) times daily with a meal.    midodrine (PROAMATINE) 5 MG tablet Take 1 tablet (5 mg total) by mouth 3 (three) times daily with meals.   Multiple Vitamin (MULTIVITAMIN WITH MINERALS) TABS tablet Take 1 tablet by mouth daily.   Multiple Vitamins-Minerals (PRESERVISION AREDS 2 PO) Take 1 tablet by mouth daily.   NYAMYC powder Apply 1 Application topically daily.   pantoprazole (PROTONIX) 40 MG tablet Take 40 mg by mouth daily.   Polyethyl Glycol-Propyl Glycol (SYSTANE) 0.4-0.3 % SOLN Apply 1 drop to eye 2 (two) times daily as needed (dry eyes).    polyethylene glycol (MIRALAX / GLYCOLAX) 17 g packet Take 17 g by mouth daily.   potassium citrate (UROCIT-K) 10 MEQ (1080 MG) SR tablet Take 10 mEq by mouth 2 (two) times daily.   pramipexole (MIRAPEX) 0.125 MG tablet Take 0.125 mg by mouth at bedtime.   QUEtiapine (SEROQUEL) 25 MG tablet Take 25 mg by mouth every evening.   QUEtiapine (SEROQUEL) 25 MG tablet Take 12.5 mg by mouth every morning.   rosuvastatin (CRESTOR) 10 MG tablet Take 10 mg by mouth daily.   saccharomyces boulardii (FLORASTOR) 250 MG capsule Take 250 mg by mouth daily.   senna-docusate (SENOKOT-S) 8.6-50 MG tablet Take 1 tablet by mouth at bedtime.    sertraline (ZOLOFT) 50 MG tablet Take 100 mg by mouth at bedtime.   Skin Protectants, Misc. (EUCERIN) cream Apply 1 application  topically daily.   No facility-administered encounter medications on file as of 09/04/2022.    Review of Systems  Constitutional:  Positive for fatigue.  HENT: Negative.    Respiratory:  Positive for shortness of breath.   Cardiovascular:  Positive for leg swelling.  Gastrointestinal: Negative.   Genitourinary: Negative.   Musculoskeletal:  Positive for arthralgias.  Neurological: Negative.   Psychiatric/Behavioral:  Positive for confusion.   All other systems reviewed and are negative.   There were no vitals filed for this visit. There is no height or weight on  file to calculate BMI. Physical Exam Vitals and nursing note reviewed.  Constitutional:      Appearance: Normal appearance.  HENT:     Head: Normocephalic.     Mouth/Throat:     Mouth: Mucous membranes are moist.     Pharynx: Oropharynx is clear.  Cardiovascular:     Rate and Rhythm: Normal rate and regular rhythm.     Heart sounds: Murmur heard.  Pulmonary:     Breath sounds: Normal breath sounds. No wheezing or rales.  Abdominal:     General: Abdomen is flat. Bowel sounds are normal.     Palpations: Abdomen is soft.  Musculoskeletal:     Cervical back: Normal range of motion.  Skin:    General: Skin is warm and dry.     Comments: Inspected her feet.  There is some mild erythema on the great toes but no overt infection  Neurological:     General: No focal deficit present.     Mental Status: She is alert and oriented to person, place, and time.  Psychiatric:        Mood and Affect: Mood normal.        Behavior: Behavior normal.     Labs reviewed: Basic Metabolic Panel: Recent Labs    09/27/21 0720 01/11/22 0000 03/31/22 0000  NA 140 141 140  K 4.3 4.4 4.6  CL 108 106 108  CO2 24 24* 23*  GLUCOSE 90  --   --   BUN '14 20 20  '$ CREATININE 0.85 1.0 1.0  CALCIUM 9.4 9.5 9.4   Liver Function Tests: Recent Labs    01/11/22 0000 03/31/22 0000  AST  --  18  ALT  --  12  ALKPHOS  --  76  ALBUMIN 3.8 3.6   No results for input(s): "LIPASE", "AMYLASE" in the last 8760 hours. No results for input(s): "AMMONIA" in the last 8760 hours. CBC: Recent Labs    09/27/21 0720 09/27/21 0720 10/05/21 0000 10/09/21 0000 11/23/21 0000 03/31/22 0000  WBC 5.0   < > 5.1 5.4 6.9 8.8  NEUTROABS  --   --  2,739.00 2,538.00  --  5,342.00  HGB 8.9*  --  8.0* 8.0* 10.1* 9.6*  HCT 29.4*  --  26* 26* 31* 30*  MCV 98.3  --   --   --   --   --   PLT 307  --  253 281 268 248   < > = values in this interval not displayed.   Cardiac Enzymes: No results for input(s): "CKTOTAL",  "CKMB", "CKMBINDEX", "TROPONINI" in the last 8760 hours. BNP: Invalid input(s): "POCBNP" No results found for: "HGBA1C" Lab Results  Component Value Date   TSH 1.67 01/11/2022   No results found for: "VITAMINB12" No results found for: "FOLATE" No results found for: "IRON", "TIBC", "FERRITIN"  Imaging and Procedures obtained prior to SNF admission: DG C-Arm 1-60 Min  Result  Date: 09/27/2021 CLINICAL DATA:  Cystoscopy with right ureteroscopy and laser lithotripsy. Right ureteral stent placement. Right ureteral dilatation. Bilateral retrograde pyelogram. EXAM: DG C-ARM 1-60 MIN CONTRAST:  Not provided FLUOROSCOPY TIME:  Fluoroscopy Time:  1 minute 9 seconds Radiation Exposure Index (if provided by the fluoroscopic device): 14.4 mGy Number of Acquired Spot Images: 6 COMPARISON:  CT urogram 09/08/2021 FINDINGS: Submitted intraoperative fluoroscopic images demonstrate balloon dilatation the distal right ureter. Right ureteral stent is seen. There is cannulation of the distal left ureter with retrograde opacification of the renal collecting system and ureter. IMPRESSION: Intraoperative images of left retrograde pyelogram and right ureteral dilation and stent placement as above. Electronically Signed   By: Miachel Roux M.D.   On: 09/27/2021 14:26    Assessment/Plan 1. Swelling of both ankles Swelling is minimal.  Continues to take furosemide   2. Chronic heart failure with preserved ejection fraction (HCC) Likely secondary to aortic stenosis.  Not a candidate for TAVR  3. Anemia, unspecified type Last recent hemoglobin was 9.6 which is actually improved from 8.0 at last check  4. Stage 3a chronic kidney disease ([place]) Labs actually showed normal creatinine and BUN at last check  5. Heart murmur Aortic stenosis with preserved function but also likely contributor to shortness of breath.  Ejection fraction 67 with normal wall motion  6. Acquired hypothyroidism TSH was in therapeutic range.   Up-to-date  7. Parkinson's disease ([place]) No tremor noted on exam today.  She does take Sinemet.  She is also on midodrine to maintain her blood pressure when standing.   Family/ staff Communication:   Labs/tests order  Lillette Boxer. Sabra Heck, [place] [address] [place], [place] Office [phone]

## 2022-09-05 DIAGNOSIS — M6281 Muscle weakness (generalized): Secondary | ICD-10-CM | POA: Diagnosis not present

## 2022-09-05 DIAGNOSIS — R29898 Other symptoms and signs involving the musculoskeletal system: Secondary | ICD-10-CM | POA: Diagnosis not present

## 2022-09-05 DIAGNOSIS — H903 Sensorineural hearing loss, bilateral: Secondary | ICD-10-CM | POA: Diagnosis not present

## 2022-09-05 DIAGNOSIS — R278 Other lack of coordination: Secondary | ICD-10-CM | POA: Diagnosis not present

## 2022-09-05 DIAGNOSIS — R2681 Unsteadiness on feet: Secondary | ICD-10-CM | POA: Diagnosis not present

## 2022-09-05 DIAGNOSIS — G20 Parkinson's disease: Secondary | ICD-10-CM | POA: Diagnosis not present

## 2022-09-06 DIAGNOSIS — R29898 Other symptoms and signs involving the musculoskeletal system: Secondary | ICD-10-CM | POA: Diagnosis not present

## 2022-09-06 DIAGNOSIS — G20 Parkinson's disease: Secondary | ICD-10-CM | POA: Diagnosis not present

## 2022-09-06 DIAGNOSIS — R2681 Unsteadiness on feet: Secondary | ICD-10-CM | POA: Diagnosis not present

## 2022-09-06 DIAGNOSIS — R278 Other lack of coordination: Secondary | ICD-10-CM | POA: Diagnosis not present

## 2022-09-06 DIAGNOSIS — M6281 Muscle weakness (generalized): Secondary | ICD-10-CM | POA: Diagnosis not present

## 2022-09-24 ENCOUNTER — Non-Acute Institutional Stay (SKILLED_NURSING_FACILITY): Payer: Medicare Other | Admitting: Nurse Practitioner

## 2022-09-24 ENCOUNTER — Encounter: Payer: Self-pay | Admitting: Nurse Practitioner

## 2022-09-24 DIAGNOSIS — E039 Hypothyroidism, unspecified: Secondary | ICD-10-CM | POA: Diagnosis not present

## 2022-09-24 DIAGNOSIS — K5909 Other constipation: Secondary | ICD-10-CM | POA: Diagnosis not present

## 2022-09-24 DIAGNOSIS — R634 Abnormal weight loss: Secondary | ICD-10-CM

## 2022-09-24 DIAGNOSIS — N390 Urinary tract infection, site not specified: Secondary | ICD-10-CM

## 2022-09-24 DIAGNOSIS — R413 Other amnesia: Secondary | ICD-10-CM | POA: Diagnosis not present

## 2022-09-24 DIAGNOSIS — F323 Major depressive disorder, single episode, severe with psychotic features: Secondary | ICD-10-CM

## 2022-09-24 DIAGNOSIS — G20A1 Parkinson's disease without dyskinesia, without mention of fluctuations: Secondary | ICD-10-CM

## 2022-09-24 DIAGNOSIS — D508 Other iron deficiency anemias: Secondary | ICD-10-CM

## 2022-09-24 DIAGNOSIS — I350 Nonrheumatic aortic (valve) stenosis: Secondary | ICD-10-CM

## 2022-09-24 DIAGNOSIS — I5032 Chronic diastolic (congestive) heart failure: Secondary | ICD-10-CM | POA: Diagnosis not present

## 2022-09-24 DIAGNOSIS — N200 Calculus of kidney: Secondary | ICD-10-CM | POA: Diagnosis not present

## 2022-09-24 DIAGNOSIS — E7800 Pure hypercholesterolemia, unspecified: Secondary | ICD-10-CM

## 2022-09-24 DIAGNOSIS — G2581 Restless legs syndrome: Secondary | ICD-10-CM

## 2022-09-24 DIAGNOSIS — M154 Erosive (osteo)arthritis: Secondary | ICD-10-CM

## 2022-09-24 DIAGNOSIS — K219 Gastro-esophageal reflux disease without esophagitis: Secondary | ICD-10-CM

## 2022-09-24 NOTE — Assessment & Plan Note (Signed)
stable on MiraPex, Sinemet hs, better sleep at night.  

## 2022-09-24 NOTE — Assessment & Plan Note (Signed)
takes Sinemet, MiraPex f/u Dr. Tat, w/c for mobility most of time.  

## 2022-09-24 NOTE — Assessment & Plan Note (Signed)
Hx of rena lithiasis, recurrent UTI, urosepsis, treated by ID. 09/19/21 cystoscopy, laser lithotripsy, R ureteral stent/dilatation. Occasional dysuria present. worsened dysuria, urinary frequency, urgency, and incontinence.

## 2022-09-24 NOTE — Assessment & Plan Note (Signed)
complaining of burning when voiding, worsened incontinence/urinary frequency, also blood in urine and verbal aggression noted. The patient is afebrile, noted discomfort in suprapubic area. Denied nausea, vomiting, or constipation. UA C/S and CBC/diff to r/i UTI

## 2022-09-24 NOTE — Assessment & Plan Note (Signed)
Cardiology palliative approach. On Midodrine per Cardiology. More noticeable DOE, fatigue.  

## 2022-09-24 NOTE — Assessment & Plan Note (Signed)
trace edema BLE, takes Furosemide, cardiology, severe aortic valve stenosis. Echocardiogram 11/15/20 EF 60-65%. Bun/creat 20/0.95 03/30/22

## 2022-09-24 NOTE — Assessment & Plan Note (Signed)
takes Levothyroxine TSH 1.67 01/11/22

## 2022-09-24 NOTE — Assessment & Plan Note (Signed)
LDL 64 01/11/22, on Rosuvastatin.  

## 2022-09-24 NOTE — Assessment & Plan Note (Signed)
Weight loss about #7Ibs in the past months, f/u dietary, update CBC/diff, CMP/eGFR, TSH.

## 2022-09-24 NOTE — Assessment & Plan Note (Signed)
aches and pains,  takes Plaquenil, Tylenol  f/u Rheumatology.  

## 2022-09-24 NOTE — Assessment & Plan Note (Signed)
takes Fe, Hgb 9.6 03/30/22

## 2022-09-24 NOTE — Assessment & Plan Note (Signed)
Stable,  takes Senokot S, Amitiza, Colace, MiraLax  

## 2022-09-24 NOTE — Assessment & Plan Note (Signed)
takes Pantoprazole, Famotidine, yearly FOBT per GI, Hgb 9.6 03/31/22

## 2022-09-24 NOTE — Assessment & Plan Note (Signed)
Continue SNF [facility] for supportive care, update MMSE

## 2022-09-24 NOTE — Assessment & Plan Note (Signed)
Seems worsened since her husband doesn't visit as often as prior since his hospitalization,  takes Sertraline,  Quetiapine. Change Tramadol to prn helped. TSH 1.67 2.2.23

## 2022-09-24 NOTE — Progress Notes (Unsigned)
Location:   S[facility] Room Number: 45 Place of Service:  SNF (31) Provider: [facility] Sonny Poth NP  [name], MD  Patient Care Team: [name], MD as PCP - General (Internal Medicine) [name], MD as PCP - Cardiology (Cardiology) [name], MD (Neurosurgery) [name], MD (Neurology) [name], MD (Inactive) as Consulting Physician (Gastroenterology) [name], MD as Consulting Physician (Urology) Rudy Luhmann X, NP as Nurse Practitioner (Internal Medicine) Tat, Eustace Quail, DO as Consulting Physician (Neurology)  Extended Emergency Contact Information Primary Emergency Contact: [facility] Address: [address]          [place], [place] [address] [name] of [place] Phone: [phone] Mobile Phone: [phone] Relation: Spouse Secondary Emergency Contact: [name] Mobile Phone: [phone] Relation: Daughter Preferred language: [name] Interpreter needed? No  Code Status: DNR Goals of care: Advanced Directive information    08/03/2022   10:32 AM  Advanced Directives  Does Patient Have a Medical Advance Directive? Yes  Type of Advance Directive Out of facility DNR (pink MOST or yellow form)  Does patient want to make changes to medical advance directive? No - Patient declined  Pre-existing out of facility DNR order (yellow form or pink MOST form) Yellow form placed in chart (order not valid for inpatient use)     Chief Complaint  Patient presents with  . Acute Visit    Burning sensation upon urination.     HPI:  Pt is a 88 y.o. female seen today for an acute visit for complaining of burning when voiding, worsened incontinence/urinary frequency, also blood in urine and verbal aggression noted. The patient is afebrile, noted discomfort in suprapubic area. Denied nausea, vomiting, or constipation.   Weight loss about #7Ibs in the past months  Memory loss, progressing.   Dry eyes, uses  artificial tears.  Prolapsed rectum, avoid constipation, usually reduced on its own.              Hx of rena lithiasis, recurrent UTI, urosepsis, treated by ID. 09/19/21 cystoscopy, laser lithotripsy, R ureteral stent/dilatation. Occasional dysuria present. worsened dysuria, urinary frequency, urgency, and incontinence.              GERD, takes Pantoprazole, Famotidine, yearly FOBT per GI, Hgb 9.6 03/31/22             Parkinson's, takes Sinemet, MiraPex f/u Dr. Carles Collet, w/c for mobility most of time.              RLS stable on MiraPex, Sinemet hs, better sleep at night.              Depression/hallucination, takes Sertraline,  Quetiapine. Change Tramadol to prn helped. TSH 1.67 2.2.23             Constipation, takes Senokot S, Amitiza, Colace, MiraLax              Hypothyroidism, takes Levothyroxine TSH 1.67 01/11/22             Erosive arthritis, aches and pains,  takes Plaquenil, Tylenol  f/u Rheumatology.              CHF/trace edema BLE, takes Furosemide, cardiology, severe aortic valve stenosis. Echocardiogram 11/15/20 EF 60-65%. Bun/creat 20/0.95 03/30/22             Hyperlipidemia, LDL 64 01/11/22, on Rosuvastatin.              Anemia, takes Fe, Hgb 9.6 03/30/22  AS/CAD Cardiology palliative approach. On Midodrine per Cardiology. More noticeable DOE, fatigue.    Past Medical History:  Diagnosis Date  . Abnormal nuclear stress test   . Actinic cheilitis 04/23/2019  . Anemia    iron deficiency   . Anxiety   . Aortic stenosis    severe by echo 2021   . Arthritis   . Broken arm    right   . Chronic diastolic CHF (congestive heart failure) ([place])   . Chronic pain   . Cognitive communication deficit   . Cognitive deficits    Communication  . Constipation   . Depression   . Erosive (osteo)arthritis 03/09/2020  . Family history of adverse reaction to anesthesia    pt. states sister vomits  . Fibromyalgia   . GERD (gastroesophageal reflux disease)   . H/O hiatal hernia   .  Hallucinations, visual 07/21/2018   03/10/20 psych consult.   . Hematuria 05/18/2020  . History of bronchitis   . History of kidney stones   . Hypercholesterolemia   . Hypertension    dr t turner  . Hypothyroidism   . Memory loss 04/21/2018   08/08/18 CT head no traumatic findings, atrophy.  09/27/19 MMSE 25/30, failed the clock drawing  . Microhematuria 03/24/2015  . Mild CAD   . Neuropathy   . Osteoporosis   . Parkinson disease   . PVD (peripheral vascular disease) (HCC)    99% stenosis of left anteiror tibial artery, mod stenosis of left distal SFA and popliteal artery followed by [name]  . RBBB    noted on EKG 2018  . Restless leg syndrome   . Sepsis ([place])    hx of due to [facility]  . Septic shock ([place]) 04/19/2020  . Shortness of breath    with exertion - chronic   . Sjogren's disease ([place])   . Sjogren's syndrome [facility]) 02/04/2018   04/16/19 rheumatology: Erosive OA of hands, Sjogrens syndrome, low back pain at multiple sites, recurrent kidney stones, age related osteoporosis w/o current pathological fracture, f/u 6 months.   . SOB (shortness of breath)    chronic due to diastolic dysfunction, deconditioning, obesity  . Spinal stenosis    lumbar region   . Tremor   . Unstable gait 04/21/2018  . Unsteadiness on feet   . Urinary frequency 09/18/2018  . Urinary tract infection   . Visual hallucinations    Past Surgical History:  Procedure Laterality Date  . ABDOMINAL HYSTERECTOMY    . BACK SURGERY     4 back surgeries,   lumbar fusion  . CARDIAC CATHETERIZATION  2006   normal  . CYSTOSCOPY/URETEROSCOPY/HOLMIUM LASER/STENT PLACEMENT Left 01/06/2019   Procedure: CYSTOSCOPY/RETROGRADE/URETEROSCOPY/HOLMIUM LASER/BASKET RETRIEVAL/STENT PLACEMENT;  Surgeon: Ceasar Mons, MD;  Location: [facility];  Service: Urology;  Laterality: Left;  . CYSTOSCOPY/URETEROSCOPY/HOLMIUM LASER/STENT PLACEMENT Bilateral 05/24/2020   Procedure: CYSTOSCOPY/RETROGRADE/URETEROSCOPY/HOLMIUM  LASER/STENT PLACEMENT;  Surgeon: Ceasar Mons, MD;  Location: [facility];  Service: Urology;  Laterality: Bilateral;  . CYSTOSCOPY/URETEROSCOPY/HOLMIUM LASER/STENT PLACEMENT Right 09/27/2021   Procedure: CYSTOSCOPY/BILATERAL RETROGRADE/URETEROSCOPY/HOLMIUM LASER/STENT PLACEMENT;  Surgeon: Ceasar Mons, MD;  Location: [facility];  Service: Urology;  Laterality: Right;  ONLY NEEDS 60 MIN  . EYE SURGERY Bilateral    cateracts  . falls     variious fall, broken wrist,and toes  . FRACTURE SURGERY Right April 2016   Wrist, Pt. fell  . IR NEPHROSTOMY PLACEMENT RIGHT  04/19/2020  . kidney stone removal Left 01/20/2019  . RIGHT/LEFT HEART CATH AND  CORONARY ANGIOGRAPHY N/A 02/05/2018   Procedure: RIGHT/LEFT HEART CATH AND CORONARY ANGIOGRAPHY;  Surgeon: Troy Sine, MD;  Location: [facility] CV LAB;  Service: Cardiovascular;  Laterality: N/A;  . SPINAL CORD STIMULATOR INSERTION N/A 09/09/2015   Procedure: LUMBAR SPINAL CORD STIMULATOR INSERTION;  Surgeon: Clydell Hakim, MD;  Location: [facility];  Service: Neurosurgery;  Laterality: N/A;  LUMBAR SPINAL CORD STIMULATOR INSERTION  . SPINE SURGERY  April 2013   Back X's 4  . TOTAL HIP ARTHROPLASTY     right  . WRIST FRACTURE SURGERY Bilateral     Allergies  Allergen Reactions  . Phosphate     Other reaction(s): Unknown  . Adrenalone   . Codeine Other (See Comments)    Reaction:  Headaches and nightmares  Other reaction(s): Unknown  . Lactose Other (See Comments)    abd pain, lactose intolerant  . Lactose Intolerance (Gi) Nausea And Vomiting  . Latex Rash  . Lyrica [Pregabalin] Swelling and Other (See Comments)    Reaction:  Leg swelling  . Morphine Other (See Comments)  . Other Other (See Comments)    Pt states that pain medications give her nightmares.    . Plaquenil [Hydroxychloroquine Sulfate] Other (See Comments)    Reaction:  GI upset   . Reglan [Metoclopramide] Other (See Comments)    Reaction:  GI upset   .  Requip [Ropinirole Hcl] Other (See Comments)    Reaction:  GI upset   . Septra [Sulfamethoxazole-Trimethoprim] Nausea And Vomiting  . Shellfish Allergy Nausea And Vomiting  . Sulfa Antibiotics Other (See Comments)    Headache, very sick    Allergies as of 09/24/2022       Reactions   Phosphate    Other reaction(s): Unknown   Adrenalone    Codeine Other (See Comments)   Reaction:  Headaches and nightmares  Other reaction(s): Unknown   Lactose Other (See Comments)   abd pain, lactose intolerant   Lactose Intolerance (gi) Nausea And Vomiting   Latex Rash   Lyrica [pregabalin] Swelling, Other (See Comments)   Reaction:  Leg swelling   Morphine Other (See Comments)   Other Other (See Comments)   Pt states that pain medications give her nightmares.     Plaquenil [hydroxychloroquine Sulfate] Other (See Comments)   Reaction:  GI upset    Reglan [metoclopramide] Other (See Comments)   Reaction:  GI upset    Requip [ropinirole Hcl] Other (See Comments)   Reaction:  GI upset    Septra [sulfamethoxazole-trimethoprim] Nausea And Vomiting   Shellfish Allergy Nausea And Vomiting   Sulfa Antibiotics Other (See Comments)   Headache, very sick        Medication List        Accurate as of September 24, 2022 11:59 PM. If you have any questions, ask your nurse or doctor.          acetaminophen 500 MG tablet Commonly known as: TYLENOL Take 500 mg by mouth See admin instructions. Take 1 tablet (500 mg) by mouth scheduled 3 times daily scheduled & 3 times daily as needed for pain (Not to exceed 3g/24 hrs)   aspirin 81 MG chewable tablet Chew 81 mg by mouth daily.   Calcium 600/Vitamin D3 600-20 MG-MCG Tabs Generic drug: Calcium Carb-Cholecalciferol Take 1 tablet by mouth daily.   carbidopa-levodopa 25-100 MG tablet Commonly known as: SINEMET IR Take 1 tablet by mouth 3 (three) times daily.   docusate sodium 100 MG capsule Commonly known as: COLACE  Take 100 mg by mouth at  bedtime.   eucerin cream Apply 1 application topically daily.   famotidine 20 MG tablet Commonly known as: PEPCID Take 20 mg by mouth daily.   furosemide 20 MG tablet Commonly known as: LASIX Take 20 mg by mouth daily as needed.   furosemide 20 MG tablet Commonly known as: LASIX Take 1 tablet (20 mg total) by mouth daily.   hydroxychloroquine 200 MG tablet Commonly known as: PLAQUENIL Take 200 mg by mouth daily.   lactose free nutrition Liqd Take by mouth 2 (two) times daily. 1/2 box; Resident likes chocolate. Twice A Day   levothyroxine 50 MCG tablet Commonly known as: SYNTHROID Take 50 mcg by mouth daily before breakfast.   loperamide 2 MG tablet Commonly known as: IMODIUM A-D Take 2 mg by mouth every 6 (six) hours as needed for diarrhea or loose stools.   lubiprostone 24 MCG capsule Commonly known as: AMITIZA Take 24 mcg by mouth 2 (two) times daily with a meal.   midodrine 5 MG tablet Commonly known as: PROAMATINE Take 1 tablet (5 mg total) by mouth 3 (three) times daily with meals.   multivitamin with minerals Tabs tablet Take 1 tablet by mouth daily.   Nyamyc powder Generic drug: nystatin Apply 1 Application topically daily.   pantoprazole 40 MG tablet Commonly known as: PROTONIX Take 40 mg by mouth daily.   polyethylene glycol 17 g packet Commonly known as: MIRALAX / GLYCOLAX Take 17 g by mouth daily.   potassium citrate 10 MEQ (1080 MG) SR tablet Commonly known as: UROCIT-K Take 10 mEq by mouth 2 (two) times daily.   pramipexole 0.125 MG tablet Commonly known as: MIRAPEX Take 0.125 mg by mouth at bedtime.   PRESERVISION AREDS 2 PO Take 1 tablet by mouth daily.   QUEtiapine 25 MG tablet Commonly known as: SEROQUEL Take 25 mg by mouth every evening.   QUEtiapine 25 MG tablet Commonly known as: SEROQUEL Take 12.5 mg by mouth every morning.   rosuvastatin 10 MG tablet Commonly known as: CRESTOR Take 10 mg by mouth daily.    saccharomyces boulardii 250 MG capsule Commonly known as: FLORASTOR Take 250 mg by mouth daily.   senna-docusate 8.6-50 MG tablet Commonly known as: Senokot-S Take 1 tablet by mouth at bedtime.   sertraline 50 MG tablet Commonly known as: ZOLOFT Take 100 mg by mouth at bedtime.   Slow Fe 142 (45 Fe) MG Tbcr Generic drug: Ferrous Sulfate Take 142 mg by mouth in the morning.   Systane 0.4-0.3 % Soln Generic drug: Polyethyl Glycol-Propyl Glycol Apply 1 drop to eye 2 (two) times daily as needed (dry eyes).   Vitamin D 50 MCG (2000 UT) tablet Take 2,000 Units by mouth daily.        Review of Systems  Constitutional:  Positive for unexpected weight change. Negative for activity change, appetite change and fever.  HENT:  Positive for hearing loss. Negative for congestion and voice change.   Eyes:  Negative for visual disturbance.       Dry eyes.   Respiratory:  Positive for shortness of breath. Negative for cough.        Chronic DOE  Cardiovascular:  Positive for leg swelling.  Gastrointestinal:  Negative for abdominal pain, nausea and vomiting.  Genitourinary:  Positive for dysuria, frequency and urgency.       Urinary leakage   Musculoskeletal:  Positive for arthralgias, back pain, gait problem and myalgias.  Chronic lower back pain. The R 3rd finger mid knuckle pain is chronic.   Skin:  Negative for color change.  Neurological:  Positive for tremors. Negative for speech difficulty, weakness and headaches.       Memory lapses. Minimal resting tremor in fingers, not disabling. RLS occasional symptomatic.   Psychiatric/Behavioral:  Positive for agitation, behavioral problems and hallucinations. Negative for sleep disturbance. The patient is not nervous/anxious.     Immunization History  Administered Date(s) Administered  . Hepatitis A, Adult 03/01/1997, 09/20/1997  . Influenza Split 09/09/2013, 09/27/2014  . Influenza, High Dose Seasonal PF 08/01/2011, 08/27/2012,  10/14/2013, 09/08/2015, 09/10/2016, 09/12/2017, 09/12/2018, 09/15/2019  . Influenza,inj,Quad PF,6+ Mos 09/11/2016  . Influenza-Unspecified 09/21/2020, 09/28/2021  . Moderna Sars-Covid-2 Vaccination 12/12/2019, 01/09/2020, 10/18/2020, 05/09/2021, 08/29/2021  . Pneumococcal Conjugate-13 04/15/2014  . Pneumococcal Polysaccharide-23 09/28/2005  . Td 06/20/2016  . Tdap 04/29/2006, 09/01/2015  . Zoster Recombinat (Shingrix) 06/11/2018, 09/10/2018  . Zoster, Live 04/29/2006   Pertinent  Health Maintenance Due  Topic Date Due  . INFLUENZA VACCINE  07/10/2022  . DEXA SCAN  Completed      08/04/2021    1:31 PM 08/09/2021   10:17 AM 09/27/2021    7:24 AM 10/10/2021   11:15 AM 03/13/2022    2:41 PM  Fall Risk  Falls in the past year? '1 1  1 ' 0  Was there an injury with Fall? 1 1  0 0  Fall Risk Category Calculator '3 2  2 ' 0  Fall Risk Category High Moderate  Moderate Low  Patient Fall Risk Level High fall risk High fall risk High fall risk Moderate fall risk   Patient at Risk for Falls Due to  History of fall(s)     Fall risk Follow up  Falls evaluation completed;Education provided      Functional Status Survey:    Vitals:   09/24/22 1218  BP: (!) 112/57  Pulse: 68  Resp: 16  Temp: 97.6 F (36.4 C)  Weight: 119 lb 3.2 oz (54.1 kg)   Body mass index is 26.72 kg/m. Physical Exam Vitals and nursing note reviewed.  Constitutional:      Appearance: Normal appearance.  HENT:     Head: Normocephalic and atraumatic.     Nose: Nose normal.     Mouth/Throat:     Mouth: Mucous membranes are moist.  Eyes:     Extraocular Movements: Extraocular movements intact.     Conjunctiva/sclera: Conjunctivae normal.     Pupils: Pupils are equal, round, and reactive to light.  Cardiovascular:     Rate and Rhythm: Normal rate and regular rhythm.     Heart sounds: Murmur heard.  Pulmonary:     Effort: Pulmonary effort is normal.     Breath sounds: No rales.  Abdominal:     General: Bowel sounds  are normal. There is no distension.     Palpations: Abdomen is soft.     Tenderness: There is no right CVA tenderness, left CVA tenderness, guarding or rebound.     Comments: Suprapubic region discomfort when palpated.   Genitourinary:    Comments: Hx of reported protruded rectum was reduced on its own, a external hemorrhoid at 7pm, no s/s of injury.  Musculoskeletal:        General: Tenderness present.     Cervical back: Normal range of motion and neck supple.     Right lower leg: Edema present.     Left lower leg: Edema present.     Comments:  Trace edema BLE.  Arthritic changes in fingers, R>L. The R 3rd finger mid knuckle swelling, no heat or redness. Chronic lower back pain.   Skin:    General: Skin is warm and dry.  Neurological:     General: No focal deficit present.     Mental Status: She is alert. Mental status is at baseline.     Gait: Gait abnormal.     Comments: Oriented to person, place. Ambulates with walker.   Psychiatric:        Mood and Affect: Mood normal.        Behavior: Behavior normal.     Comments: Visual hallucination vs delusion    Labs reviewed: Recent Labs    09/27/21 0720 01/11/22 0000 03/31/22 0000  NA 140 141 140  K 4.3 4.4 4.6  CL 108 106 108  CO2 24 24* 23*  GLUCOSE 90  --   --   BUN '14 20 20  ' CREATININE 0.85 1.0 1.0  CALCIUM 9.4 9.5 9.4   Recent Labs    01/11/22 0000 03/31/22 0000  AST  --  18  ALT  --  12  ALKPHOS  --  76  ALBUMIN 3.8 3.6   Recent Labs    09/27/21 0720 09/27/21 0720 10/05/21 0000 10/09/21 0000 11/23/21 0000 03/31/22 0000  WBC 5.0   < > 5.1 5.4 6.9 8.8  NEUTROABS  --   --  2,739.00 2,538.00  --  5,342.00  HGB 8.9*  --  8.0* 8.0* 10.1* 9.6*  HCT 29.4*  --  26* 26* 31* 30*  MCV 98.3  --   --   --   --   --   PLT 307  --  253 281 268 248   < > = values in this interval not displayed.   Lab Results  Component Value Date   TSH 1.67 01/11/2022   No results found for: "HGBA1C" Lab Results  Component Value  Date   CHOL 137 01/11/2022   HDL 47 01/11/2022   LDLCALC 64 01/11/2022   TRIG 184 (A) 01/11/2022   CHOLHDL 2.4 07/14/2019    Significant Diagnostic Results in last 30 days:  No results found.  Assessment/Plan: Recurrent UTI complaining of burning when voiding, worsened incontinence/urinary frequency, also blood in urine and verbal aggression noted. The patient is afebrile, noted discomfort in suprapubic area. Denied nausea, vomiting, or constipation. UA C/S and CBC/diff to r/i UTI  Weight loss Weight loss about #7Ibs in the past months, f/u dietary, update CBC/diff, CMP/eGFR, TSH.   Memory loss Continue SNF [facility] for supportive care, update MMSE  Renal lithiasis Hx of rena lithiasis, recurrent UTI, urosepsis, treated by ID. 09/19/21 cystoscopy, laser lithotripsy, R ureteral stent/dilatation. Occasional dysuria present. worsened dysuria, urinary frequency, urgency, and incontinence.   GERD (gastroesophageal reflux disease) takes Pantoprazole, Famotidine, yearly FOBT per GI, Hgb 9.6 03/31/22  Parkinson's disease ([place]) takes Sinemet, MiraPex f/u Dr. Carles Collet, w/c for mobility most of time.   Restless leg syndrome stable on MiraPex, Sinemet hs, better sleep at night.   Depression, psychotic ([place]) Seems worsened since her husband doesn't visit as often as prior since his hospitalization,  takes Sertraline,  Quetiapine. Change Tramadol to prn helped. TSH 1.67 2.2.23  Chronic constipation Stable, takes Senokot S, Amitiza, Colace, MiraLax   Hypothyroidism takes Levothyroxine TSH 1.67 01/11/22  Erosive (osteo)arthritis  aches and pains,  takes Plaquenil, Tylenol  f/u Rheumatology.   (HFpEF) heart failure with preserved ejection fraction (HCC) trace edema BLE,  takes Furosemide, cardiology, severe aortic valve stenosis. Echocardiogram 11/15/20 EF 60-65%. Bun/creat 20/0.95 03/30/22  Hypercholesterolemia LDL 64 01/11/22,  on Rosuvastatin.   Iron deficiency anemia  takes Fe, Hgb 9.6  03/30/22  Severe aortic stenosis - not a candidate for TAVR  Cardiology palliative approach. On Midodrine per Cardiology. More noticeable DOE, fatigue.     Family/ staff Communication: plan of care reviewed with the patient and charge nurse.   Labs/tests ordered:  UA C/S, CBC/diff, CMP/eGFR  Time spend 35 minutes.

## 2022-09-24 NOTE — Progress Notes (Unsigned)
Assessment/Plan:   1.  Parkinsons Disease  -Continue carbidopa/levodopa 25/100, 1 tablet 3 times per day  -continue carbidopa/levodopa CR 25/100 at bed  -stop pramipexole 0.125 mg q hs   2.  Hallucinations, likely with dementia  -She is off of scheduled tramadol and only getting as needed now.  Hallucinations have improved somewhat since doing this.  -increase quetiapine, 25 mg bid.  If not helpful and still with hallucination but is not sleepy, we will increase further.  If too sleepy, we will change to nuplazid.  They are to let me know in 2 weeks how she is  -Patient in full-time SNF (husband in independent living at [facility])   Subjective:   [name] was seen today in follow up for Parkinsons disease.  My previous records were reviewed prior to todays visit as well as outside records available to me.  Husband supplements hx.  Patient remains on levodopa and low-dose pramipexole (only for restless leg). She has lost a lot of weight since last visit.  Hallucinations are increasing.  "They are frightening and are people I don't know."  She had one fall since last here but didn't get hurt.    Current prescribed movement disorder medications: Carbidopa/levodopa 25/100, 1 tablet 3 times per day Carbidopa/levodopa 25/100 CR at bedtime (I do not see she is on this any longer) Pramipexole, 0.125 mg at bed  PREVIOUS MEDICATIONS: Sinemet and Mirapex; Ultram (stopped because of hallucinations - snf)  ALLERGIES:   Allergies  Allergen Reactions   Phosphate     Other reaction(s): Unknown   Adrenalone    Codeine Other (See Comments)    Reaction:  Headaches and nightmares  Other reaction(s): Unknown   Lactose Other (See Comments)    abd pain, lactose intolerant   Lactose Intolerance (Gi) Nausea And Vomiting   Latex Rash   Lyrica [Pregabalin] Swelling and Other (See Comments)    Reaction:  Leg swelling   Morphine Other (See Comments)   Other Other (See Comments)    Pt  states that pain medications give her nightmares.     Plaquenil [Hydroxychloroquine Sulfate] Other (See Comments)    Reaction:  GI upset    Reglan [Metoclopramide] Other (See Comments)    Reaction:  GI upset    Requip [Ropinirole Hcl] Other (See Comments)    Reaction:  GI upset    Septra [Sulfamethoxazole-Trimethoprim] Nausea And Vomiting   Shellfish Allergy Nausea And Vomiting   Sulfa Antibiotics Other (See Comments)    Headache, very sick    CURRENT MEDICATIONS:  Outpatient Encounter Medications as of 09/25/2022  Medication Sig   aspirin 81 MG chewable tablet Chew 81 mg by mouth daily.   Calcium Carb-Cholecalciferol (CALCIUM 600/VITAMIN D3) 600-800 MG-UNIT TABS Take 1 tablet by mouth daily.   carbidopa-levodopa (SINEMET IR) 25-100 MG tablet Take 1 tablet by mouth 3 (three) times daily.   Carbidopa-Levodopa ER (SINEMET CR) 25-100 MG tablet controlled release Take 1 tablet by mouth daily.   docusate sodium (COLACE) 100 MG capsule Take 100 mg by mouth at bedtime.   famotidine (PEPCID) 20 MG tablet Take 20 mg by mouth daily.   furosemide (LASIX) 20 MG tablet Take 1 tablet (20 mg total) by mouth daily.   hydroxychloroquine (PLAQUENIL) 200 MG tablet Take 200 mg by mouth daily.   lactose free nutrition (BOOST) LIQD Take by mouth 2 (two) times daily. 1/2 box; Resident likes chocolate. Twice A Day   levothyroxine (SYNTHROID, LEVOTHROID) 50 MCG tablet Take  50 mcg by mouth daily before breakfast.    lubiprostone (AMITIZA) 24 MCG capsule Take 24 mcg by mouth 2 (two) times daily with a meal.    midodrine (PROAMATINE) 5 MG tablet Take 1 tablet (5 mg total) by mouth 3 (three) times daily with meals.   Multiple Vitamin (MULTIVITAMIN WITH MINERALS) TABS tablet Take 1 tablet by mouth daily.   Multiple Vitamins-Minerals (PRESERVISION AREDS 2 PO) Take 1 tablet by mouth daily.   NYAMYC powder Apply 1 Application topically daily.   pantoprazole (PROTONIX) 40 MG tablet Take 40 mg by mouth daily.    Polyethyl Glycol-Propyl Glycol (SYSTANE) 0.4-0.3 % SOLN Apply 1 drop to eye 2 (two) times daily as needed (dry eyes).    polyethylene glycol (MIRALAX / GLYCOLAX) 17 g packet Take 17 g by mouth daily.   potassium citrate (UROCIT-K) 10 MEQ (1080 MG) SR tablet Take 10 mEq by mouth 2 (two) times daily.   pramipexole (MIRAPEX) 0.125 MG tablet Take 0.125 mg by mouth at bedtime.   QUEtiapine (SEROQUEL) 25 MG tablet Take 25 mg by mouth every evening.   rosuvastatin (CRESTOR) 10 MG tablet Take 10 mg by mouth daily.   saccharomyces boulardii (FLORASTOR) 250 MG capsule Take 250 mg by mouth daily.   senna-docusate (SENOKOT-S) 8.6-50 MG tablet Take 1 tablet by mouth at bedtime.    sertraline (ZOLOFT) 50 MG tablet Take 100 mg by mouth at bedtime.   Skin Protectants, Misc. (EUCERIN) cream Apply 1 application topically daily.   acetaminophen (TYLENOL) 500 MG tablet Take 500 mg by mouth See admin instructions. Take 1 tablet (500 mg) by mouth scheduled 3 times daily scheduled & 3 times daily as needed for pain (Not to exceed 3g/24 hrs)   Cholecalciferol (VITAMIN D) 50 MCG (2000 UT) tablet Take 2,000 Units by mouth daily.   Ferrous Sulfate (SLOW FE) 142 (45 Fe) MG TBCR Take 142 mg by mouth in the morning.   loperamide (IMODIUM A-D) 2 MG tablet Take 2 mg by mouth every 6 (six) hours as needed for diarrhea or loose stools. (Patient not taking: Reported on 09/25/2022)   [DISCONTINUED] furosemide (LASIX) 20 MG tablet Take 20 mg by mouth daily as needed.   [DISCONTINUED] QUEtiapine (SEROQUEL) 25 MG tablet Take 12.5 mg by mouth every morning.   No facility-administered encounter medications on file as of 09/25/2022.    Objective:   PHYSICAL EXAMINATION:    VITALS:   Vitals:   09/25/22 1441  BP: 110/75  Pulse: 79  SpO2: 99%  Weight: 101 lb (45.8 kg)  Height: '4\' 8"'$  (1.422 m)      GEN:  The patient appears stated age and is in NAD. HEENT:  Normocephalic, atraumatic.  The mucous membranes are moist. The  superficial temporal arteries are without ropiness or tenderness. CV:  RRR Lungs:  CTAB Neck/HEME:  There are no carotid bruits bilaterally.  Neurological examination:  Orientation: The patient is alert and oriented x3. Cranial nerves: There is good facial symmetry with facial hypomimia. The speech is fluent and clear. Soft palate rises symmetrically and there is no tongue deviation. Hearing is intact to conversational tone. Sensation: Sensation is intact to light touch throughout Motor: Strength is at least antigravity x4.  Movement examination: Tone: normal tone Abnormal movements: None Coordination:  There is slowness with all rapid terminating movements and it appears some decremation as well, left greater than right. Gait and Station: The patient requires assistance out of the wheelchair.  She has retropulsion with arising (same  as last visit).  The right foot sticks to the ground and has some freezing.  She has significant trouble with ambulating.  She drags the R leg.  This is similar to prior.    I have reviewed and interpreted the following labs independently    Chemistry      Component Value Date/Time   NA 140 03/31/2022 0000   K 4.6 03/31/2022 0000   CL 108 03/31/2022 0000   CL 110 07/24/2019 0000   CO2 23 (A) 03/31/2022 0000   CO2 24 07/24/2019 0000   BUN 20 03/31/2022 0000   CREATININE 1.0 03/31/2022 0000   CREATININE 0.85 09/27/2021 0720   CREATININE 0.65 05/26/2019 0906   GLU 91 03/31/2022 0000      Component Value Date/Time   CALCIUM 9.4 03/31/2022 0000   CALCIUM 9.0 07/24/2019 0000   ALKPHOS 76 03/31/2022 0000   AST 18 03/31/2022 0000   ALT 12 03/31/2022 0000   BILITOT 0.6 04/24/2020 0423   BILITOT 0.3 07/14/2019 0811       Lab Results  Component Value Date   WBC 8.8 03/31/2022   HGB 9.6 (A) 03/31/2022   HCT 30 (A) 03/31/2022   MCV 98.3 09/27/2021   PLT 248 03/31/2022    Lab Results  Component Value Date   TSH 1.67 01/11/2022   Total time  spent on today's visit was 31 minutes, including both face-to-face time and nonface-to-face time.  Time included that spent on review of records (prior notes available to me/labs/imaging if pertinent), discussing treatment and goals, answering patient's questions and coordinating care.   Cc:  [name], MD

## 2022-09-25 ENCOUNTER — Encounter: Payer: Self-pay | Admitting: Neurology

## 2022-09-25 ENCOUNTER — Ambulatory Visit (INDEPENDENT_AMBULATORY_CARE_PROVIDER_SITE_OTHER): Payer: Medicare Other | Admitting: Neurology

## 2022-09-25 VITALS — BP 110/75 | HR 79 | Ht <= 58 in | Wt 101.0 lb

## 2022-09-25 DIAGNOSIS — I2510 Atherosclerotic heart disease of native coronary artery without angina pectoris: Secondary | ICD-10-CM

## 2022-09-25 DIAGNOSIS — E039 Hypothyroidism, unspecified: Secondary | ICD-10-CM | POA: Diagnosis not present

## 2022-09-25 DIAGNOSIS — G20A1 Parkinson's disease without dyskinesia, without mention of fluctuations: Secondary | ICD-10-CM | POA: Diagnosis not present

## 2022-09-25 DIAGNOSIS — R441 Visual hallucinations: Secondary | ICD-10-CM

## 2022-09-25 DIAGNOSIS — E785 Hyperlipidemia, unspecified: Secondary | ICD-10-CM | POA: Diagnosis not present

## 2022-09-25 DIAGNOSIS — N390 Urinary tract infection, site not specified: Secondary | ICD-10-CM | POA: Diagnosis not present

## 2022-09-25 DIAGNOSIS — D649 Anemia, unspecified: Secondary | ICD-10-CM | POA: Diagnosis not present

## 2022-09-25 LAB — CBC AND DIFFERENTIAL
HCT: 35 — AB (ref 36–46)
Hemoglobin: 11.4 — AB (ref 12.0–16.0)
Platelets: 288 10*3/uL (ref 150–400)
WBC: 6.7

## 2022-09-25 LAB — COMPREHENSIVE METABOLIC PANEL
Albumin: 4.2 (ref 3.5–5.0)
Calcium: 10.1 (ref 8.7–10.7)
Globulin: 2.4
eGFR: 56

## 2022-09-25 LAB — BASIC METABOLIC PANEL
BUN: 17 (ref 4–21)
CO2: 28 — AB (ref 13–22)
Chloride: 107 (ref 99–108)
Creatinine: 1 (ref 0.5–1.1)
Glucose: 88
Potassium: 4.4 mEq/L (ref 3.5–5.1)
Sodium: 143 (ref 137–147)

## 2022-09-25 LAB — HEPATIC FUNCTION PANEL
ALT: 14 U/L (ref 7–35)
AST: 24 (ref 13–35)
Alkaline Phosphatase: 76 (ref 25–125)
Bilirubin, Total: 0.4

## 2022-09-25 LAB — CBC: RBC: 3.74 — AB (ref 3.87–5.11)

## 2022-09-25 LAB — TSH: TSH: 2.21 (ref 0.41–5.90)

## 2022-09-25 MED ORDER — QUETIAPINE FUMARATE 25 MG PO TABS
25.0000 mg | ORAL_TABLET | Freq: Two times a day (BID) | ORAL | 1 refills | Status: DC
Start: 2022-09-25 — End: 2023-07-23

## 2022-09-25 NOTE — Patient Instructions (Signed)
Hold pramipexole Increase quetiapine to 25 mg twice per day 3.  Let us know how you are doing in 2 weeks  The physicians and staff at [facility] are committed to providing excellent care. You may receive a survey requesting feedback about your experience at our office. We strive to receive "very good" responses to the survey questions. If you feel that your experience would prevent you from giving the office a "very good " response, please contact our office to try to remedy the situation. We may be reached at [phone]. Thank you for taking the time out of your busy day to complete the survey.

## 2022-09-28 ENCOUNTER — Non-Acute Institutional Stay (SKILLED_NURSING_FACILITY): Payer: Medicare Other | Admitting: Nurse Practitioner

## 2022-09-28 ENCOUNTER — Encounter: Payer: Self-pay | Admitting: Nurse Practitioner

## 2022-09-28 DIAGNOSIS — G2581 Restless legs syndrome: Secondary | ICD-10-CM | POA: Diagnosis not present

## 2022-09-28 DIAGNOSIS — G20A1 Parkinson's disease without dyskinesia, without mention of fluctuations: Secondary | ICD-10-CM

## 2022-09-28 DIAGNOSIS — K219 Gastro-esophageal reflux disease without esophagitis: Secondary | ICD-10-CM

## 2022-09-28 DIAGNOSIS — K623 Rectal prolapse: Secondary | ICD-10-CM

## 2022-09-28 DIAGNOSIS — I5032 Chronic diastolic (congestive) heart failure: Secondary | ICD-10-CM

## 2022-09-28 DIAGNOSIS — I350 Nonrheumatic aortic (valve) stenosis: Secondary | ICD-10-CM

## 2022-09-28 DIAGNOSIS — E7800 Pure hypercholesterolemia, unspecified: Secondary | ICD-10-CM

## 2022-09-28 DIAGNOSIS — E039 Hypothyroidism, unspecified: Secondary | ICD-10-CM

## 2022-09-28 DIAGNOSIS — M154 Erosive (osteo)arthritis: Secondary | ICD-10-CM

## 2022-09-28 DIAGNOSIS — R441 Visual hallucinations: Secondary | ICD-10-CM

## 2022-09-28 DIAGNOSIS — N200 Calculus of kidney: Secondary | ICD-10-CM | POA: Diagnosis not present

## 2022-09-28 DIAGNOSIS — D508 Other iron deficiency anemias: Secondary | ICD-10-CM | POA: Diagnosis not present

## 2022-09-28 DIAGNOSIS — K5909 Other constipation: Secondary | ICD-10-CM

## 2022-09-28 DIAGNOSIS — N390 Urinary tract infection, site not specified: Secondary | ICD-10-CM

## 2022-09-28 DIAGNOSIS — R634 Abnormal weight loss: Secondary | ICD-10-CM

## 2022-09-28 NOTE — Assessment & Plan Note (Signed)
Hx of rena lithiasis, recurrent UTI, urosepsis, treated by ID. 09/19/21 cystoscopy, laser lithotripsy, R ureteral stent/dilatation. Occasional dysuria present. Positive urine culture.

## 2022-09-28 NOTE — Assessment & Plan Note (Signed)
LDL 64 01/11/22, on Rosuvastatin.  

## 2022-09-28 NOTE — Assessment & Plan Note (Addendum)
Stable, takes Pantoprazole, Famotidine, yearly FOBT per GI

## 2022-09-28 NOTE — Assessment & Plan Note (Signed)
F/u dietary, 09/25/22 Na 143, K 4.4, Bun 17, creat 0.97, wbc 6.7, Hgb 11.4, plt 288, neutrophils 63.5, TSH 2.21.

## 2022-09-28 NOTE — Progress Notes (Signed)
Location:  [facility] manager of Service:  SNF (31) Provider:  Anaih Brander X, NP   Claudie Rathbone X, NP  Patient Care Team: Lavonda Thal X, NP as PCP - General (Internal Medicine) [name], MD as PCP - Cardiology (Cardiology) [name], MD (Neurosurgery) [name], MD (Neurology) [name], MD (Inactive) as Consulting Physician (Gastroenterology) [name], MD as Consulting Physician (Urology) Kaleea Penner X, NP as Nurse Practitioner (Internal Medicine) Tat, Eustace Quail, DO as Consulting Physician (Neurology)  Extended Emergency Contact Information Primary Emergency Contact: [facility] Address: [address]          [place], [place] [address] [name] of [place] Phone: [phone] Mobile Phone: [phone] Relation: Spouse Secondary Emergency Contact: [name] Mobile Phone: [phone] Relation: Daughter Preferred language: [name] Interpreter needed? No  Code Status:  DNR Goals of care: Advanced Directive information    09/28/2022   11:44 AM  Advanced Directives  Does Patient Have a Medical Advance Directive? Yes  Type of Advance Directive Out of facility DNR (pink MOST or yellow form)  Does patient want to make changes to medical advance directive? No - Patient declined  Pre-existing out of facility DNR order (yellow form or pink MOST form) Pink MOST/Yellow Form most recent copy in chart - Physician notified to receive inpatient order     Chief Complaint  Patient presents with  . Medical Management of Chronic Issues    Routine visit on medical management   . Immunizations    Discussed the need for flu vaccine    HPI:  Pt is a 88 y.o. female seen today for medical management of chronic diseases.     Weight loss about #7Ibs in the past months             Memory loss, progressing.              Dry eyes, uses artificial tears.  Prolapsed rectum, avoid constipation, usually reduced on its own.               Hx of rena lithiasis, recurrent UTI, urosepsis, treated by ID. 09/19/21 cystoscopy, laser lithotripsy, R ureteral stent/dilatation. Occasional dysuria present. Pending urine culture.              GERD, takes Pantoprazole, Famotidine, yearly FOBT per GI, Hgb 11.4 09/25/22             Parkinson's, takes Sinemet, MiraPex f/u Dr. Carles Collet, w/c for mobility most of time.              RLS stable on MiraPex, Sinemet hs, better sleep at night.              Depression/hallucination, takes Sertraline,  Quetiapine. Change Tramadol to prn helped. TSH 2.21 09/25/22             Constipation, takes Senokot S, Amitiza, Colace, MiraLax              Hypothyroidism, takes Levothyroxine TSH 2.21 09/25/22             Erosive arthritis, aches and pains,  takes Plaquenil, Tylenol  f/u Rheumatology.              CHF/trace edema BLE, takes Furosemide, cardiology, severe aortic valve stenosis. Echocardiogram 11/15/20 EF 60-65%. Bun/creat 17/0.97 09/25/22             Hyperlipidemia, LDL 64 01/11/22, on Rosuvastatin.  Anemia, takes Fe, Hgb 11.4 09/25/22             AS/CAD Cardiology palliative approach. On Midodrine per Cardiology. More noticeable DOE, fatigue.    Past Medical History:  Diagnosis Date  . Abnormal nuclear stress test   . Actinic cheilitis 04/23/2019  . Anemia    iron deficiency   . Anxiety   . Aortic stenosis    severe by echo 2021   . Arthritis   . Broken arm    right   . Chronic diastolic CHF (congestive heart failure) ([place])   . Chronic pain   . Cognitive communication deficit   . Cognitive deficits    Communication  . Constipation   . Depression   . Erosive (osteo)arthritis 03/09/2020  . Family history of adverse reaction to anesthesia    pt. states sister vomits  . Fibromyalgia   . GERD (gastroesophageal reflux disease)   . H/O hiatal hernia   . Hallucinations, visual 07/21/2018   03/10/20 psych consult.   . Hematuria 05/18/2020  . History of bronchitis   . History of kidney  stones   . Hypercholesterolemia   . Hypertension    dr t turner  . Hypothyroidism   . Memory loss 04/21/2018   08/08/18 CT head no traumatic findings, atrophy.  09/27/19 MMSE 25/30, failed the clock drawing  . Microhematuria 03/24/2015  . Mild CAD   . Neuropathy   . Osteoporosis   . Parkinson disease   . PVD (peripheral vascular disease) (HCC)    99% stenosis of left anteiror tibial artery, mod stenosis of left distal SFA and popliteal artery followed by [name]  . RBBB    noted on EKG 2018  . Restless leg syndrome   . Sepsis ([place])    hx of due to [facility]  . Septic shock ([place]) 04/19/2020  . Shortness of breath    with exertion - chronic   . Sjogren's disease ([place])   . Sjogren's syndrome [facility]) 02/04/2018   04/16/19 rheumatology: Erosive OA of hands, Sjogrens syndrome, low back pain at multiple sites, recurrent kidney stones, age related osteoporosis w/o current pathological fracture, f/u 6 months.   . SOB (shortness of breath)    chronic due to diastolic dysfunction, deconditioning, obesity  . Spinal stenosis    lumbar region   . Tremor   . Unstable gait 04/21/2018  . Unsteadiness on feet   . Urinary frequency 09/18/2018  . Urinary tract infection   . Visual hallucinations    Past Surgical History:  Procedure Laterality Date  . ABDOMINAL HYSTERECTOMY    . BACK SURGERY     4 back surgeries,   lumbar fusion  . CARDIAC CATHETERIZATION  2006   normal  . CYSTOSCOPY/URETEROSCOPY/HOLMIUM LASER/STENT PLACEMENT Left 01/06/2019   Procedure: CYSTOSCOPY/RETROGRADE/URETEROSCOPY/HOLMIUM LASER/BASKET RETRIEVAL/STENT PLACEMENT;  Surgeon: Ceasar Mons, MD;  Location: [facility];  Service: Urology;  Laterality: Left;  . CYSTOSCOPY/URETEROSCOPY/HOLMIUM LASER/STENT PLACEMENT Bilateral 05/24/2020   Procedure: CYSTOSCOPY/RETROGRADE/URETEROSCOPY/HOLMIUM LASER/STENT PLACEMENT;  Surgeon: Ceasar Mons, MD;  Location: [facility];  Service: Urology;  Laterality: Bilateral;  .  CYSTOSCOPY/URETEROSCOPY/HOLMIUM LASER/STENT PLACEMENT Right 09/27/2021   Procedure: CYSTOSCOPY/BILATERAL RETROGRADE/URETEROSCOPY/HOLMIUM LASER/STENT PLACEMENT;  Surgeon: Ceasar Mons, MD;  Location: [facility];  Service: Urology;  Laterality: Right;  ONLY NEEDS 60 MIN  . EYE SURGERY Bilateral    cateracts  . falls     variious fall, broken wrist,and toes  . FRACTURE SURGERY Right April 2016   Wrist, Pt. fell  . IR  NEPHROSTOMY PLACEMENT RIGHT  04/19/2020  . kidney stone removal Left 01/20/2019  . RIGHT/LEFT HEART CATH AND CORONARY ANGIOGRAPHY N/A 02/05/2018   Procedure: RIGHT/LEFT HEART CATH AND CORONARY ANGIOGRAPHY;  Surgeon: Troy Sine, MD;  Location: [facility];  Service: Cardiovascular;  Laterality: N/A;  . SPINAL CORD STIMULATOR INSERTION N/A 09/09/2015   Procedure: LUMBAR SPINAL CORD STIMULATOR INSERTION;  Surgeon: Clydell Hakim, MD;  Location: [facility];  Service: Neurosurgery;  Laterality: N/A;  LUMBAR SPINAL CORD STIMULATOR INSERTION  . SPINE SURGERY  April 2013   Back X's 4  . TOTAL HIP ARTHROPLASTY     right  . WRIST FRACTURE SURGERY Bilateral     Allergies  Allergen Reactions  . Phosphate     Other reaction(s): Unknown  . Adrenalone   . Codeine Other (See Comments)    Reaction:  Headaches and nightmares  Other reaction(s): Unknown  . Lactose Other (See Comments)    abd pain, lactose intolerant  . Lactose Intolerance (Gi) Nausea And Vomiting  . Latex Rash  . Lyrica [Pregabalin] Swelling and Other (See Comments)    Reaction:  Leg swelling  . Morphine Other (See Comments)  . Other Other (See Comments)    Pt states that pain medications give her nightmares.    . Plaquenil [Hydroxychloroquine Sulfate] Other (See Comments)    Reaction:  GI upset   . Reglan [Metoclopramide] Other (See Comments)    Reaction:  GI upset   . Requip [Ropinirole Hcl] Other (See Comments)    Reaction:  GI upset   . Septra [Sulfamethoxazole-Trimethoprim] Nausea And  Vomiting  . Shellfish Allergy Nausea And Vomiting  . Sulfa Antibiotics Other (See Comments)    Headache, very sick    Outpatient Encounter Medications as of 09/28/2022  Medication Sig  . acetaminophen (TYLENOL) 500 MG tablet Take 500 mg by mouth See admin instructions. Take 1 tablet (500 mg) by mouth scheduled 3 times daily scheduled & 3 times daily as needed for pain (Not to exceed 3g/24 hrs)  . aspirin 81 MG chewable tablet Chew 81 mg by mouth daily.  . Calcium Carb-Cholecalciferol (CALCIUM 600/VITAMIN D3) 600-800 MG-UNIT TABS Take 1 tablet by mouth daily.  . carbidopa-levodopa (SINEMET IR) 25-100 MG tablet Take 1 tablet by mouth 3 (three) times daily.  . Carbidopa-Levodopa ER (SINEMET CR) 25-100 MG tablet controlled release Take 1 tablet by mouth daily.  . Cholecalciferol (VITAMIN D) 50 MCG (2000 UT) tablet Take 2,000 Units by mouth daily.  Marland Kitchen docusate sodium (COLACE) 100 MG capsule Take 100 mg by mouth at bedtime.  . famotidine (PEPCID) 20 MG tablet Take 20 mg by mouth daily.  . Ferrous Sulfate (SLOW FE) 142 (45 Fe) MG TBCR Take 142 mg by mouth in the morning.  . furosemide (LASIX) 20 MG tablet Take 1 tablet (20 mg total) by mouth daily.  . hydroxychloroquine (PLAQUENIL) 200 MG tablet Take 200 mg by mouth daily.  Marland Kitchen lactose free nutrition (BOOST) LIQD Take by mouth 2 (two) times daily. 1/2 box; Resident likes chocolate. Twice A Day  . levothyroxine (SYNTHROID, LEVOTHROID) 50 MCG tablet Take 50 mcg by mouth daily before breakfast.   . lubiprostone (AMITIZA) 24 MCG capsule Take 24 mcg by mouth 2 (two) times daily with a meal.   . midodrine (PROAMATINE) 5 MG tablet Take 1 tablet (5 mg total) by mouth 3 (three) times daily with meals.  . Multiple Vitamin (MULTIVITAMIN WITH MINERALS) TABS tablet Take 1 tablet by mouth daily.  Marland Kitchen  Multiple Vitamins-Minerals (PRESERVISION AREDS 2 PO) Take 1 tablet by mouth daily.  Marland Kitchen NYAMYC powder Apply 1 Application topically daily.  . pantoprazole (PROTONIX) 40  MG tablet Take 40 mg by mouth daily.  Vladimir Faster Glycol-Propyl Glycol (SYSTANE) 0.4-0.3 % SOLN Apply 1 drop to eye 2 (two) times daily as needed (dry eyes).   . polyethylene glycol (MIRALAX / GLYCOLAX) 17 g packet Take 17 g by mouth daily.  . potassium citrate (UROCIT-K) 10 MEQ (1080 MG) SR tablet Take 10 mEq by mouth 2 (two) times daily.  . QUEtiapine (SEROQUEL) 25 MG tablet Take 1 tablet (25 mg total) by mouth 2 (two) times daily.  . rosuvastatin (CRESTOR) 10 MG tablet Take 10 mg by mouth daily.  Marland Kitchen senna-docusate (SENOKOT-S) 8.6-50 MG tablet Take 1 tablet by mouth at bedtime.   . sertraline (ZOLOFT) 50 MG tablet Take 100 mg by mouth at bedtime.  . Skin Protectants, Misc. (EUCERIN) cream Apply 1 application topically daily.  Marland Kitchen saccharomyces boulardii (FLORASTOR) 250 MG capsule Take 250 mg by mouth daily.  . [DISCONTINUED] loperamide (IMODIUM A-D) 2 MG tablet Take 2 mg by mouth every 6 (six) hours as needed for diarrhea or loose stools. (Patient not taking: Reported on 09/25/2022)   No facility-administered encounter medications on file as of 09/28/2022.    Review of Systems  Constitutional:  Positive for unexpected weight change. Negative for activity change, appetite change and fever.  HENT:  Positive for hearing loss. Negative for congestion and voice change.   Eyes:  Negative for visual disturbance.       Dry eyes.   Respiratory:  Positive for shortness of breath. Negative for cough.        Chronic DOE  Cardiovascular:  Positive for leg swelling.  Gastrointestinal:  Negative for abdominal pain, nausea and vomiting.  Genitourinary:  Positive for dysuria and frequency. Negative for urgency.       Urinary leakage  Musculoskeletal:  Positive for arthralgias, back pain, gait problem and myalgias.       Chronic lower back pain. The R 3rd finger mid knuckle pain is chronic.   Skin:  Negative for color change.  Neurological:  Positive for tremors. Negative for speech difficulty, weakness  and headaches.       Memory lapses. Minimal resting tremor in fingers, not disabling. RLS occasional symptomatic.   Psychiatric/Behavioral:  Positive for agitation, behavioral problems and hallucinations. Negative for sleep disturbance. The patient is not nervous/anxious.     Immunization History  Administered Date(s) Administered  . Hepatitis A, Adult 03/01/1997, 09/20/1997  . Influenza Split 09/09/2013, 09/27/2014  . Influenza, High Dose Seasonal PF 08/01/2011, 08/27/2012, 10/14/2013, 09/08/2015, 09/10/2016, 09/12/2017, 09/12/2018, 09/15/2019  . Influenza,inj,Quad PF,6+ Mos 09/11/2016  . Influenza-Unspecified 09/21/2020, 09/28/2021  . Moderna Sars-Covid-2 Vaccination 12/12/2019, 01/09/2020, 10/18/2020, 05/09/2021, 08/29/2021  . Pneumococcal Conjugate-13 04/15/2014  . Pneumococcal Polysaccharide-23 09/28/2005  . Td 06/20/2016  . Tdap 04/29/2006, 09/01/2015  . Zoster Recombinat (Shingrix) 06/11/2018, 09/10/2018  . Zoster, Live 04/29/2006   Pertinent  Health Maintenance Due  Topic Date Due  . INFLUENZA VACCINE  07/10/2022  . DEXA SCAN  Completed      09/27/2021    7:24 AM 10/10/2021   11:15 AM 03/13/2022    2:41 PM 09/25/2022    2:40 PM 09/28/2022   11:43 AM  Fall Risk  Falls in the past year?  1 0 0 0  Was there an injury with Fall?  0 0  0  Fall Risk Category Calculator  2 0  0  Fall Risk Category  Moderate Low  Low  Patient Fall Risk Level High fall risk Moderate fall risk     Patient at Risk for Falls Due to     History of fall(s)  Fall risk Follow up     Falls evaluation completed   Functional Status Survey:    Vitals:   09/28/22 1101  BP: 108/68  Pulse: 68  Resp: 16  Temp: 97.8 F (36.6 C)  SpO2: 95%  Weight: 159 lb 12.8 oz (72.5 kg)  Height: '4\' 8"'$  (1.422 m)   Body mass index is 35.83 kg/m. Physical Exam Vitals and nursing note reviewed.  Constitutional:      Appearance: Normal appearance.  HENT:     Head: Normocephalic and atraumatic.     Nose: Nose  normal.     Mouth/Throat:     Mouth: Mucous membranes are moist.  Eyes:     Extraocular Movements: Extraocular movements intact.     Conjunctiva/sclera: Conjunctivae normal.     Pupils: Pupils are equal, round, and reactive to light.  Cardiovascular:     Rate and Rhythm: Normal rate and regular rhythm.     Heart sounds: Murmur heard.  Pulmonary:     Effort: Pulmonary effort is normal.     Breath sounds: No rales.  Abdominal:     General: Bowel sounds are normal. There is no distension.     Palpations: Abdomen is soft.     Tenderness: There is no right CVA tenderness, left CVA tenderness, guarding or rebound.     Comments: Suprapubic region discomfort when palpated.   Genitourinary:    Comments: Hx of reported protruded rectum was reduced on its own, a external hemorrhoid at 7pm, no s/s of injury.  Musculoskeletal:        General: Tenderness present.     Cervical back: Normal range of motion and neck supple.     Right lower leg: Edema present.     Left lower leg: Edema present.     Comments: Trace edema BLE.  Arthritic changes in fingers, R>L. The R 3rd finger mid knuckle swelling, no heat or redness. Chronic lower back pain.   Skin:    General: Skin is warm and dry.  Neurological:     General: No focal deficit present.     Mental Status: She is alert. Mental status is at baseline.     Gait: Gait abnormal.     Comments: Oriented to person, place. Ambulates with walker.   Psychiatric:        Mood and Affect: Mood normal.        Behavior: Behavior normal.     Comments: Visual hallucination vs delusion    Labs reviewed: Recent Labs    01/11/22 0000 03/31/22 0000  NA 141 140  K 4.4 4.6  CL 106 108  CO2 24* 23*  BUN 20 20  CREATININE 1.0 1.0  CALCIUM 9.5 9.4   Recent Labs    01/11/22 0000 03/31/22 0000  AST  --  18  ALT  --  12  ALKPHOS  --  76  ALBUMIN 3.8 3.6   Recent Labs    10/05/21 0000 10/09/21 0000 11/23/21 0000 03/31/22 0000  WBC 5.1 5.4 6.9 8.8   NEUTROABS 2,739.00 2,538.00  --  5,342.00  HGB 8.0* 8.0* 10.1* 9.6*  HCT 26* 26* 31* 30*  PLT 253 281 268 248   Lab Results  Component Value Date   TSH  1.67 01/11/2022   No results found for: "HGBA1C" Lab Results  Component Value Date   CHOL 137 01/11/2022   HDL 47 01/11/2022   LDLCALC 64 01/11/2022   TRIG 184 (A) 01/11/2022   CHOLHDL 2.4 07/14/2019    Significant Diagnostic Results in last 30 days:  No results found.  Assessment/Plan GERD (gastroesophageal reflux disease) Stable, takes Pantoprazole, Famotidine, yearly FOBT per GI  Parkinson's disease ([place]) takes Sinemet, MiraPex f/u Dr. Carles Collet, w/c for mobility most of time.   Restless leg syndrome stable on MiraPex, Sinemet hs, better sleep at night.   Hallucinations, visual Chronic, at her baseline, takes Sertraline,  Quetiapine. Change Tramadol to prn helped.   Chronic constipation Stable, takes Senokot S, Amitiza, Colace, MiraLax   Hypothyroidism  takes Levothyroxine TSH 2.21 09/25/22  Erosive (osteo)arthritis  aches and pains,  takes Plaquenil, Tylenol  f/u Rheumatology.   (HFpEF) heart failure with preserved ejection fraction (HCC) CHF/trace edema BLE, takes Furosemide, cardiology, severe aortic valve stenosis. Echocardiogram 11/15/20 EF 60-65%. Chronic, DOE  Hypercholesterolemia LDL 64 01/11/22, on Rosuvastatin.   Iron deficiency anemia  takes Fe, Hgb 11.4 09/25/22  Severe aortic stenosis - not a candidate for TAVR AS/CAD Cardiology palliative approach. On Midodrine per Cardiology. More noticeable DOE, fatigue.   Renal lithiasis Hx of rena lithiasis, recurrent UTI, urosepsis, treated by ID. 09/19/21 cystoscopy, laser lithotripsy, R ureteral stent/dilatation. Occasional dysuria present. Pending urine culture.   Rectal prolapse  avoid constipation, usually reduced on its own.   Weight loss F/u dietary, 09/25/22 Na 143, K 4.4, Bun 17, creat 0.97, wbc 6.7, Hgb 11.4, plt 288, neutrophils 63.5, TSH 2.21.       Family/ staff Communication: plan of care reviewed with the patient and charge nurse.   Labs/tests ordered:  none  Time spend 35 minutes.

## 2022-09-28 NOTE — Assessment & Plan Note (Signed)
Stable,  takes Senokot S, Amitiza, Colace, MiraLax  

## 2022-09-28 NOTE — Assessment & Plan Note (Addendum)
CHF/trace edema BLE, takes Furosemide, cardiology, severe aortic valve stenosis. Echocardiogram 11/15/20 EF 60-65%. Chronic, DOE

## 2022-09-28 NOTE — Assessment & Plan Note (Addendum)
takes Fe, Hgb 11.4 09/25/22 

## 2022-09-28 NOTE — Assessment & Plan Note (Signed)
AS/CAD Cardiology palliative approach. On Midodrine per Cardiology. More noticeable DOE, fatigue.  

## 2022-09-28 NOTE — Assessment & Plan Note (Signed)
aches and pains,  takes Plaquenil, Tylenol  f/u Rheumatology.  

## 2022-09-28 NOTE — Assessment & Plan Note (Signed)
avoid constipation, usually reduced on its own.

## 2022-09-28 NOTE — Assessment & Plan Note (Signed)
stable on MiraPex, Sinemet hs, better sleep at night.  

## 2022-09-28 NOTE — Assessment & Plan Note (Signed)
takes Sinemet, MiraPex f/u Dr. Tat, w/c for mobility most of time.  

## 2022-09-28 NOTE — Assessment & Plan Note (Addendum)
Chronic, at her baseline, takes Sertraline,  Quetiapine. Change Tramadol to prn helped.

## 2022-09-28 NOTE — Assessment & Plan Note (Addendum)
takes Levothyroxine TSH 2.21 09/25/22 

## 2022-10-01 ENCOUNTER — Encounter: Payer: Self-pay | Admitting: Nurse Practitioner

## 2022-10-01 DIAGNOSIS — F323 Major depressive disorder, single episode, severe with psychotic features: Secondary | ICD-10-CM | POA: Diagnosis not present

## 2022-10-01 DIAGNOSIS — F411 Generalized anxiety disorder: Secondary | ICD-10-CM | POA: Diagnosis not present

## 2022-10-01 DIAGNOSIS — R441 Visual hallucinations: Secondary | ICD-10-CM | POA: Diagnosis not present

## 2022-10-01 DIAGNOSIS — G3184 Mild cognitive impairment, so stated: Secondary | ICD-10-CM | POA: Insufficient documentation

## 2022-10-01 NOTE — Assessment & Plan Note (Signed)
09/28/22 Cipro '500mg'$  bid x 7 days.

## 2022-10-01 NOTE — Progress Notes (Signed)
This encounter was created in error - please disregard.

## 2022-10-04 DIAGNOSIS — H353133 Nonexudative age-related macular degeneration, bilateral, advanced atrophic without subfoveal involvement: Secondary | ICD-10-CM | POA: Diagnosis not present

## 2022-10-09 ENCOUNTER — Ambulatory Visit (INDEPENDENT_AMBULATORY_CARE_PROVIDER_SITE_OTHER): Payer: Medicare Other | Admitting: Podiatry

## 2022-10-09 DIAGNOSIS — M79674 Pain in right toe(s): Secondary | ICD-10-CM | POA: Diagnosis not present

## 2022-10-09 DIAGNOSIS — M79675 Pain in left toe(s): Secondary | ICD-10-CM | POA: Diagnosis not present

## 2022-10-09 DIAGNOSIS — B351 Tinea unguium: Secondary | ICD-10-CM | POA: Diagnosis not present

## 2022-10-09 DIAGNOSIS — N390 Urinary tract infection, site not specified: Secondary | ICD-10-CM | POA: Diagnosis not present

## 2022-10-09 DIAGNOSIS — G629 Polyneuropathy, unspecified: Secondary | ICD-10-CM | POA: Diagnosis not present

## 2022-10-11 DIAGNOSIS — Z23 Encounter for immunization: Secondary | ICD-10-CM | POA: Diagnosis not present

## 2022-10-16 NOTE — Progress Notes (Signed)
Subjective: 88 y.o. returns the office today for painful, elongated, thickened toenails which she cannot trim herself.  No open lesions that she reports.  She has no new concerns.  PCP: [name], MD   Objective: AAO 3, NAD DP/PT pulses palpable, CRT less than 3 seconds Mild chronic bilateral lower extremity edema present. Nails hypertrophic, dystrophic, elongated, brittle, discolored 9. There is tenderness overlying the nails 1-5 on the right and 2 through 5 on the left.   Prominent metatarsal heads plantarly. Hammertoes present without any irritation or skin breakdown. No open lesions today. No pain with calf compression, warmth, erythema.  Assessment: Patient presents with symptomatic onychomycosis, neuropathy  Plan: -Treatment options including alternatives, risks, complications were discussed -Nails sharply debrided 9 without complication/bleeding.  -Continue toe crest for hammertoes.  Continue supportive, offloading shoes. -Daily foot inspection.  Trula Slade DPM

## 2022-10-30 ENCOUNTER — Encounter: Payer: Self-pay | Admitting: Nurse Practitioner

## 2022-10-30 ENCOUNTER — Non-Acute Institutional Stay (SKILLED_NURSING_FACILITY): Payer: Medicare Other | Admitting: Nurse Practitioner

## 2022-10-30 DIAGNOSIS — I10 Essential (primary) hypertension: Secondary | ICD-10-CM | POA: Diagnosis not present

## 2022-10-30 DIAGNOSIS — I5032 Chronic diastolic (congestive) heart failure: Secondary | ICD-10-CM

## 2022-10-30 DIAGNOSIS — I350 Nonrheumatic aortic (valve) stenosis: Secondary | ICD-10-CM

## 2022-10-30 DIAGNOSIS — F323 Major depressive disorder, single episode, severe with psychotic features: Secondary | ICD-10-CM

## 2022-10-30 DIAGNOSIS — G20A1 Parkinson's disease without dyskinesia, without mention of fluctuations: Secondary | ICD-10-CM

## 2022-10-30 NOTE — Assessment & Plan Note (Signed)
Depression/hallucination, takes Sertraline,  Quetiapine. Change Tramadol to prn helped. TSH 2.21 09/25/22

## 2022-10-30 NOTE — Progress Notes (Deleted)
This encounter was created in error - please disregard.

## 2022-10-30 NOTE — Assessment & Plan Note (Signed)
CHF/trace edema BLE, DOE, takes Furosemide, cardiology, severe aortic valve stenosis. Echocardiogram 11/15/20 EF 60-65%. Bun/creat 17/0.97 09/25/22

## 2022-10-30 NOTE — Assessment & Plan Note (Signed)
Only takes Furosemide, runs low sometimes.

## 2022-10-30 NOTE — Assessment & Plan Note (Signed)
AS/CAD Cardiology palliative approach. On Midodrine per Cardiology. More noticeable DOE, fatigue.  

## 2022-10-30 NOTE — Progress Notes (Signed)
Location:   [facility] Room Number: 45 Place of Service:  SNF (31) Provider: Lennie Odor Even Budlong NP  Jerusalem Brownstein X, NP  Patient Care Team: Weldon Nouri X, NP as PCP - General (Internal Medicine) [name], MD as PCP - Cardiology (Cardiology) [name], MD (Neurosurgery) [name], MD (Neurology) [name], MD (Inactive) as Consulting Physician (Gastroenterology) [name], MD as Consulting Physician (Urology) Lanya Bucks X, NP as Nurse Practitioner (Internal Medicine) Tat, Eustace Quail, DO as Consulting Physician (Neurology)  Extended Emergency Contact Information Primary Emergency Contact: [facility] Address: [address]          [place], [place] [address] [name] of [place] Phone: [phone] Mobile Phone: [phone] Relation: Spouse Secondary Emergency Contact: [name] Mobile Phone: [phone] Relation: Daughter Preferred language: [name] Interpreter needed? No  Code Status:  DNR Goals of care: Advanced Directive information    10/30/2022   12:14 PM  Advanced Directives  Does Patient Have a Medical Advance Directive? Yes  Type of Advance Directive Living will;Out of facility DNR (pink MOST or yellow form)  Does patient want to make changes to medical advance directive? No - Patient declined  Pre-existing out of facility DNR order (yellow form or pink MOST form) Yellow form placed in chart (order not valid for inpatient use)     Chief Complaint  Patient presents with   Medical Management of Chronic Issues    HPI:  Pt is a 88 y.o. female seen today for medical management of chronic diseases.               Memory loss, progressing.              Dry eyes, uses artificial tears.  Prolapsed rectum, avoid constipation, usually reduced on its own.              Hx of rena lithiasis, recurrent UTI, urosepsis, treated by ID. 09/19/21 cystoscopy, laser lithotripsy, R ureteral stent/dilatation. Occasional  dysuria present. Positive urine culture.              GERD, takes Pantoprazole, Famotidine, yearly FOBT per GI, Hgb 11.4 09/25/22             Parkinson's, takes Sinemet, MiraPex f/u Dr. Carles Collet, w/c for mobility most of time.              RLS stable on MiraPex, Sinemet hs, better sleep at night.              Depression/hallucination, takes Sertraline,  Quetiapine. Change Tramadol to prn helped. TSH 2.21 09/25/22             Constipation, takes Senokot S, Amitiza, Colace, MiraLax              Hypothyroidism, takes Levothyroxine TSH 2.21 09/25/22             Erosive arthritis, aches and pains,  takes Plaquenil, Tylenol  f/u Rheumatology.              CHF/trace edema BLE, takes Furosemide, cardiology, severe aortic valve stenosis. Echocardiogram 11/15/20 EF 60-65%. Bun/creat 17/0.97 09/25/22             Hyperlipidemia, LDL 64 01/11/22, on Rosuvastatin.              Anemia, takes Fe, Hgb 11.4 09/25/22             AS/CAD Cardiology palliative approach. On Midodrine per Cardiology. More noticeable DOE, fatigue.  Past Medical History:  Diagnosis Date   Abnormal nuclear stress test    Actinic cheilitis 04/23/2019   Anemia    iron deficiency    Anxiety    Aortic stenosis    severe by echo 2021    Arthritis    Broken arm    right    Chronic diastolic CHF (congestive heart failure) ([place])    Chronic pain    Cognitive communication deficit    Cognitive deficits    Communication   Constipation    Depression    Erosive (osteo)arthritis 03/09/2020   Family history of adverse reaction to anesthesia    pt. states sister vomits   Fibromyalgia    GERD (gastroesophageal reflux disease)    H/O hiatal hernia    Hallucinations, visual 07/21/2018   03/10/20 psych consult.    Hematuria 05/18/2020   History of bronchitis    History of kidney stones    Hypercholesterolemia    Hypertension    dr t turner   Hypothyroidism    Memory loss 04/21/2018   08/08/18 CT head no traumatic findings, atrophy.   09/27/19 MMSE 25/30, failed the clock drawing   Microhematuria 03/24/2015   Mild CAD    Neuropathy    Osteoporosis    Parkinson disease    PVD (peripheral vascular disease) (HCC)    99% stenosis of left anteiror tibial artery, mod stenosis of left distal SFA and popliteal artery followed by [name]   RBBB    noted on EKG 2018   Restless leg syndrome    Sepsis ([place])    hx of due to Ecoli   Septic shock ([place]) 04/19/2020   Shortness of breath    with exertion - chronic    Sjogren's disease ([place])    Sjogren's syndrome ([place]) 02/04/2018   04/16/19 rheumatology: Erosive OA of hands, Sjogrens syndrome, low back pain at multiple sites, recurrent kidney stones, age related osteoporosis w/o current pathological fracture, f/u 6 months.    SOB (shortness of breath)    chronic due to diastolic dysfunction, deconditioning, obesity   Spinal stenosis    lumbar region    Tremor    Unstable gait 04/21/2018   Unsteadiness on feet    Urinary frequency 09/18/2018   Urinary tract infection    Visual hallucinations    Past Surgical History:  Procedure Laterality Date   ABDOMINAL HYSTERECTOMY     BACK SURGERY     4 back surgeries,   lumbar fusion   CARDIAC CATHETERIZATION  2006   normal   CYSTOSCOPY/URETEROSCOPY/HOLMIUM LASER/STENT PLACEMENT Left 01/06/2019   Procedure: CYSTOSCOPY/RETROGRADE/URETEROSCOPY/HOLMIUM LASER/BASKET RETRIEVAL/STENT PLACEMENT;  Surgeon: Ceasar Mons, MD;  Location: [facility];  Service: Urology;  Laterality: Left;   CYSTOSCOPY/URETEROSCOPY/HOLMIUM LASER/STENT PLACEMENT Bilateral 05/24/2020   Procedure: CYSTOSCOPY/RETROGRADE/URETEROSCOPY/HOLMIUM LASER/STENT PLACEMENT;  Surgeon: Ceasar Mons, MD;  Location: [facility];  Service: Urology;  Laterality: Bilateral;   CYSTOSCOPY/URETEROSCOPY/HOLMIUM LASER/STENT PLACEMENT Right 09/27/2021   Procedure: CYSTOSCOPY/BILATERAL RETROGRADE/URETEROSCOPY/HOLMIUM LASER/STENT PLACEMENT;  Surgeon: Ceasar Mons, MD;   Location: [facility];  Service: Urology;  Laterality: Right;  ONLY NEEDS 60 MIN   EYE SURGERY Bilateral    cateracts   falls     variious fall, broken wrist,and toes   FRACTURE SURGERY Right April 2016   Wrist, Pt. fell   IR NEPHROSTOMY PLACEMENT RIGHT  04/19/2020   kidney stone removal Left 01/20/2019   RIGHT/LEFT HEART CATH AND CORONARY ANGIOGRAPHY N/A 02/05/2018   Procedure: RIGHT/LEFT HEART CATH AND CORONARY ANGIOGRAPHY;  Surgeon:  Troy Sine, MD;  Location: [facility];  Service: Cardiovascular;  Laterality: N/A;   SPINAL CORD STIMULATOR INSERTION N/A 09/09/2015   Procedure: LUMBAR SPINAL CORD STIMULATOR INSERTION;  Surgeon: Clydell Hakim, MD;  Location: [facility];  Service: Neurosurgery;  Laterality: N/A;  LUMBAR SPINAL CORD STIMULATOR INSERTION   SPINE SURGERY  April 2013   Back X's 4   TOTAL HIP ARTHROPLASTY     right   WRIST FRACTURE SURGERY Bilateral     Allergies  Allergen Reactions   Phosphate     Other reaction(s): Unknown   Adrenalone    Codeine Other (See Comments)    Reaction:  Headaches and nightmares  Other reaction(s): Unknown   Lactose Other (See Comments)    abd pain, lactose intolerant   Lactose Intolerance (Gi) Nausea And Vomiting   Latex Rash   Lyrica [Pregabalin] Swelling and Other (See Comments)    Reaction:  Leg swelling   Morphine Other (See Comments)   Other Other (See Comments)    Pt states that pain medications give her nightmares.     Plaquenil [Hydroxychloroquine Sulfate] Other (See Comments)    Reaction:  GI upset    Reglan [Metoclopramide] Other (See Comments)    Reaction:  GI upset    Requip [Ropinirole Hcl] Other (See Comments)    Reaction:  GI upset    Septra [Sulfamethoxazole-Trimethoprim] Nausea And Vomiting   Shellfish Allergy Nausea And Vomiting   Sulfa Antibiotics Other (See Comments)    Headache, very sick    Allergies as of 10/30/2022       Reactions   Phosphate    Other reaction(s): Unknown   Adrenalone     Codeine Other (See Comments)   Reaction:  Headaches and nightmares  Other reaction(s): Unknown   Lactose Other (See Comments)   abd pain, lactose intolerant   Lactose Intolerance (gi) Nausea And Vomiting   Latex Rash   Lyrica [pregabalin] Swelling, Other (See Comments)   Reaction:  Leg swelling   Morphine Other (See Comments)   Other Other (See Comments)   Pt states that pain medications give her nightmares.     Plaquenil [hydroxychloroquine Sulfate] Other (See Comments)   Reaction:  GI upset    Reglan [metoclopramide] Other (See Comments)   Reaction:  GI upset    Requip [ropinirole Hcl] Other (See Comments)   Reaction:  GI upset    Septra [sulfamethoxazole-trimethoprim] Nausea And Vomiting   Shellfish Allergy Nausea And Vomiting   Sulfa Antibiotics Other (See Comments)   Headache, very sick        Medication List        Accurate as of October 30, 2022  3:23 PM. If you have any questions, ask your nurse or doctor.          acetaminophen 500 MG tablet Commonly known as: TYLENOL Take 500 mg by mouth See admin instructions. Take 1 tablet (500 mg) by mouth scheduled 3 times daily scheduled & 3 times daily as needed for pain (Not to exceed 3g/24 hrs)   aspirin 81 MG chewable tablet Chew 81 mg by mouth daily.   Calcium 600/Vitamin D3 600-20 MG-MCG Tabs Generic drug: Calcium Carb-Cholecalciferol Take 1 tablet by mouth daily.   carbidopa-levodopa 25-100 MG tablet Commonly known as: SINEMET IR Take 1 tablet by mouth 3 (three) times daily.   Carbidopa-Levodopa ER 25-100 MG tablet controlled release Commonly known as: SINEMET CR Take 1 tablet by mouth daily.   docusate sodium 100  MG capsule Commonly known as: COLACE Take 100 mg by mouth at bedtime.   eucerin cream Apply 1 application topically daily.   famotidine 20 MG tablet Commonly known as: PEPCID Take 20 mg by mouth daily.   furosemide 20 MG tablet Commonly known as: LASIX Take 1 tablet (20 mg total) by  mouth daily.   hydroxychloroquine 200 MG tablet Commonly known as: PLAQUENIL Take 200 mg by mouth daily.   lactose free nutrition Liqd Take by mouth 2 (two) times daily. 1/2 box; Resident likes chocolate. Twice A Day   levothyroxine 50 MCG tablet Commonly known as: SYNTHROID Take 50 mcg by mouth daily before breakfast.   loperamide 2 MG tablet Commonly known as: IMODIUM A-D Take 2 mg by mouth 4 (four) times daily as needed for diarrhea or loose stools.   lubiprostone 24 MCG capsule Commonly known as: AMITIZA Take 24 mcg by mouth 2 (two) times daily with a meal.   midodrine 5 MG tablet Commonly known as: PROAMATINE Take 1 tablet (5 mg total) by mouth 3 (three) times daily with meals.   multivitamin with minerals Tabs tablet Take 1 tablet by mouth daily.   nystatin powder Generic drug: nystatin Apply 1 Application topically daily. ABDOMINAL SKIN FOLD, UNDER BREAST AND GROIN What changed: Another medication with the same name was removed. Continue taking this medication, and follow the directions you see here. Changed by: Zipporah Finamore X Baldo Hufnagle, NP   pantoprazole 40 MG tablet Commonly known as: PROTONIX Take 40 mg by mouth daily.   polyethylene glycol 17 g packet Commonly known as: MIRALAX / GLYCOLAX Take 17 g by mouth daily.   potassium citrate 10 MEQ (1080 MG) SR tablet Commonly known as: UROCIT-K Take 10 mEq by mouth 2 (two) times daily.   pramipexole 0.125 MG tablet Commonly known as: MIRAPEX Take 0.125 mg by mouth at bedtime.   PRESERVISION AREDS 2 PO Take 1 tablet by mouth daily.   QUEtiapine 25 MG tablet Commonly known as: SEROQUEL Take 1 tablet (25 mg total) by mouth 2 (two) times daily.   rosuvastatin 10 MG tablet Commonly known as: CRESTOR Take 10 mg by mouth daily.   saccharomyces boulardii 250 MG capsule Commonly known as: FLORASTOR Take 250 mg by mouth daily.   senna-docusate 8.6-50 MG tablet Commonly known as: Senokot-S Take 1 tablet by mouth at  bedtime.   sertraline 50 MG tablet Commonly known as: ZOLOFT Take 100 mg by mouth at bedtime.   Slow Fe 142 (45 Fe) MG Tbcr Generic drug: Ferrous Sulfate Take 142 mg by mouth in the morning.   Systane 0.4-0.3 % Soln Generic drug: Polyethyl Glycol-Propyl Glycol Apply 1 drop to eye 2 (two) times daily as needed (dry eyes).   Vitamin D 50 MCG (2000 UT) tablet Take 2,000 Units by mouth daily.        Review of Systems  Constitutional:  Positive for unexpected weight change. Negative for activity change, appetite change and fever.  HENT:  Positive for hearing loss. Negative for congestion and voice change.   Eyes:  Negative for visual disturbance.       Dry eyes.   Respiratory:  Positive for shortness of breath. Negative for cough.        Chronic DOE  Cardiovascular:  Positive for leg swelling.  Gastrointestinal:  Negative for abdominal pain and constipation.  Genitourinary:  Positive for frequency. Negative for dysuria and urgency.       Urinary leakage  Musculoskeletal:  Positive for arthralgias, back  pain, gait problem and myalgias.       Chronic lower back pain. The R 3rd finger mid knuckle pain is chronic.   Skin:  Negative for color change.  Neurological:  Positive for tremors. Negative for speech difficulty, weakness and headaches.       Memory lapses. Minimal resting tremor in fingers, not disabling. RLS occasional symptomatic.   Psychiatric/Behavioral:  Positive for agitation, behavioral problems and hallucinations. Negative for sleep disturbance. The patient is not nervous/anxious.     Immunization History  Administered Date(s) Administered   Hepatitis A, Adult 03/01/1997, 09/20/1997   Influenza Split 09/09/2013, 09/27/2014   Influenza, High Dose Seasonal PF 08/01/2011, 08/27/2012, 10/14/2013, 09/08/2015, 09/10/2016, 09/12/2017, 09/12/2018, 09/15/2019   Influenza,inj,Quad PF,6+ Mos 09/11/2016   Influenza-Unspecified 09/21/2020, 09/28/2021   Moderna Sars-Covid-2  Vaccination 12/12/2019, 01/09/2020, 10/18/2020, 05/09/2021, 08/29/2021   Pneumococcal Conjugate-13 04/15/2014   Pneumococcal Polysaccharide-23 09/28/2005   Td 06/20/2016   Tdap 04/29/2006, 09/01/2015   Zoster Recombinat (Shingrix) 06/11/2018, 09/10/2018   Zoster, Live 04/29/2006   Pertinent  Health Maintenance Due  Topic Date Due   INFLUENZA VACCINE  07/10/2022   DEXA SCAN  Completed      09/27/2021    7:24 AM 10/10/2021   11:15 AM 03/13/2022    2:41 PM 09/25/2022    2:40 PM 09/28/2022   11:43 AM  Fall Risk  Falls in the past year?  1 0 0 0  Was there an injury with Fall?  0 0  0  Fall Risk Category Calculator  2 0  0  Fall Risk Category  Moderate Low  Low  Patient Fall Risk Level High fall risk Moderate fall risk     Patient at Risk for Falls Due to     History of fall(s)  Fall risk Follow up     Falls evaluation completed   Functional Status Survey:    Vitals:   10/30/22 1521  BP: (!) 134/58  Pulse: 85  Resp: 16  Temp: (!) 97.3 F (36.3 C)  SpO2: 96%  Weight: 119 lb 14.4 oz (54.4 kg)  Height: '4\' 8"'$  (1.422 m)   Body mass index is 26.88 kg/m. Physical Exam Vitals and nursing note reviewed.  Constitutional:      Appearance: Normal appearance.  HENT:     Head: Normocephalic and atraumatic.     Nose: Nose normal.     Mouth/Throat:     Mouth: Mucous membranes are moist.  Eyes:     Extraocular Movements: Extraocular movements intact.     Conjunctiva/sclera: Conjunctivae normal.     Pupils: Pupils are equal, round, and reactive to light.  Cardiovascular:     Rate and Rhythm: Normal rate and regular rhythm.     Heart sounds: Murmur heard.  Pulmonary:     Effort: Pulmonary effort is normal.     Breath sounds: No rales.  Abdominal:     General: Bowel sounds are normal.     Palpations: Abdomen is soft.     Tenderness: There is no abdominal tenderness.     Comments: Suprapubic region discomfort when palpated.   Genitourinary:    Comments: Hx of reported  protruded rectum was reduced on its own, a external hemorrhoid at 7pm, no s/s of injury.  Musculoskeletal:        General: Tenderness present.     Cervical back: Normal range of motion and neck supple.     Right lower leg: Edema present.     Left lower leg: Edema present.  Comments: Trace edema BLE.  Arthritic changes in fingers, R>L. The R 3rd finger mid knuckle swelling, no heat or redness. Chronic lower back pain.   Skin:    General: Skin is warm and dry.  Neurological:     General: No focal deficit present.     Mental Status: She is alert. Mental status is at baseline.     Gait: Gait abnormal.     Comments: Oriented to person, place. Ambulates with walker.   Psychiatric:        Mood and Affect: Mood normal.        Behavior: Behavior normal.     Comments: Visual hallucination vs delusion     Labs reviewed: Recent Labs    01/11/22 0000 03/31/22 0000 09/25/22 0000  NA 141 140 143  K 4.4 4.6 4.4  CL 106 108 107  CO2 24* 23* 28*  BUN '20 20 17  '$ CREATININE 1.0 1.0 1.0  CALCIUM 9.5 9.4 10.1   Recent Labs    01/11/22 0000 03/31/22 0000 09/25/22 0000  AST  --  18 24  ALT  --  12 14  ALKPHOS  --  76 76  ALBUMIN 3.8 3.6 4.2   Recent Labs    11/23/21 0000 03/31/22 0000 09/25/22 0000  WBC 6.9 8.8 6.7  NEUTROABS  --  5,342.00  --   HGB 10.1* 9.6* 11.4*  HCT 31* 30* 35*  PLT 268 248 288   Lab Results  Component Value Date   TSH 2.21 09/25/2022   No results found for: "HGBA1C" Lab Results  Component Value Date   CHOL 137 01/11/2022   HDL 47 01/11/2022   LDLCALC 64 01/11/2022   TRIG 184 (A) 01/11/2022   CHOLHDL 2.4 07/14/2019    Significant Diagnostic Results in last 30 days:  No results found.  Assessment/Plan  Essential hypertension Only takes Furosemide, runs low sometimes.   (HFpEF) heart failure with preserved ejection fraction (HCC)  CHF/trace edema BLE, DOE, takes Furosemide, cardiology, severe aortic valve stenosis. Echocardiogram 11/15/20 EF  60-65%. Bun/creat 17/0.97 09/25/22  Parkinson's disease ([place]) takes Sinemet, MiraPex f/u Dr. Carles Collet, w/c for mobility most of time.              RLS stable on MiraPex, Sinemet hs, better sleep at night.   Depression, psychotic ([place]) Depression/hallucination, takes Sertraline,  Quetiapine. Change Tramadol to prn helped. TSH 2.21 09/25/22  Severe aortic stenosis - not a candidate for TAVR AS/CAD Cardiology palliative approach. On Midodrine per Cardiology. More noticeable DOE, fatigue.    Family/ staff Communication: plan of care reviewed with the patient and charge nurse.   Labs/tests ordered:  none  Time spend 35 minutes.

## 2022-10-30 NOTE — Assessment & Plan Note (Signed)
takes Sinemet, MiraPex f/u Dr. Carles Collet, w/c for mobility most of time.              RLS stable on MiraPex, Sinemet hs, better sleep at night.

## 2022-10-30 NOTE — Progress Notes (Signed)
This encounter was created in error - please disregard.

## 2022-11-06 DIAGNOSIS — R441 Visual hallucinations: Secondary | ICD-10-CM | POA: Diagnosis not present

## 2022-11-06 DIAGNOSIS — F411 Generalized anxiety disorder: Secondary | ICD-10-CM | POA: Diagnosis not present

## 2022-11-06 DIAGNOSIS — F323 Major depressive disorder, single episode, severe with psychotic features: Secondary | ICD-10-CM | POA: Diagnosis not present

## 2022-11-12 DIAGNOSIS — F323 Major depressive disorder, single episode, severe with psychotic features: Secondary | ICD-10-CM | POA: Diagnosis not present

## 2022-11-12 DIAGNOSIS — R441 Visual hallucinations: Secondary | ICD-10-CM | POA: Diagnosis not present

## 2022-11-12 DIAGNOSIS — F411 Generalized anxiety disorder: Secondary | ICD-10-CM | POA: Diagnosis not present

## 2022-11-19 DIAGNOSIS — R441 Visual hallucinations: Secondary | ICD-10-CM | POA: Diagnosis not present

## 2022-11-19 DIAGNOSIS — F323 Major depressive disorder, single episode, severe with psychotic features: Secondary | ICD-10-CM | POA: Diagnosis not present

## 2022-11-19 DIAGNOSIS — F411 Generalized anxiety disorder: Secondary | ICD-10-CM | POA: Diagnosis not present

## 2022-11-23 ENCOUNTER — Non-Acute Institutional Stay (SKILLED_NURSING_FACILITY): Payer: Medicare Other | Admitting: Nurse Practitioner

## 2022-11-23 ENCOUNTER — Encounter: Payer: Self-pay | Admitting: Nurse Practitioner

## 2022-11-23 DIAGNOSIS — G20A1 Parkinson's disease without dyskinesia, without mention of fluctuations: Secondary | ICD-10-CM

## 2022-11-23 DIAGNOSIS — E039 Hypothyroidism, unspecified: Secondary | ICD-10-CM | POA: Diagnosis not present

## 2022-11-23 DIAGNOSIS — K219 Gastro-esophageal reflux disease without esophagitis: Secondary | ICD-10-CM | POA: Diagnosis not present

## 2022-11-23 DIAGNOSIS — F323 Major depressive disorder, single episode, severe with psychotic features: Secondary | ICD-10-CM

## 2022-11-23 DIAGNOSIS — D508 Other iron deficiency anemias: Secondary | ICD-10-CM | POA: Diagnosis not present

## 2022-11-23 DIAGNOSIS — E7800 Pure hypercholesterolemia, unspecified: Secondary | ICD-10-CM

## 2022-11-23 DIAGNOSIS — I350 Nonrheumatic aortic (valve) stenosis: Secondary | ICD-10-CM

## 2022-11-23 DIAGNOSIS — G2581 Restless legs syndrome: Secondary | ICD-10-CM | POA: Diagnosis not present

## 2022-11-23 DIAGNOSIS — M154 Erosive (osteo)arthritis: Secondary | ICD-10-CM | POA: Diagnosis not present

## 2022-11-23 DIAGNOSIS — I5032 Chronic diastolic (congestive) heart failure: Secondary | ICD-10-CM

## 2022-11-23 DIAGNOSIS — K5909 Other constipation: Secondary | ICD-10-CM | POA: Diagnosis not present

## 2022-11-23 NOTE — Assessment & Plan Note (Signed)
Cardiology palliative approach. On Midodrine per Cardiology. More noticeable DOE, fatigue.

## 2022-11-23 NOTE — Progress Notes (Addendum)
Location:   SNF [facility] Room Number: 45 A Place of Service:  SNF (31) Provider: Lennie Odor Ankush Gintz NP  Matther Labell X, NP  Patient Care Team: Lodie Waheed X, NP as PCP - General (Internal Medicine) [name], MD as PCP - Cardiology (Cardiology) [name], MD (Neurosurgery) [name], MD (Neurology) [name], MD (Inactive) as Consulting Physician (Gastroenterology) [name], MD as Consulting Physician (Urology) Uniqua Kihn X, NP as Nurse Practitioner (Internal Medicine) Tat, Eustace Quail, DO as Consulting Physician (Neurology)  Extended Emergency Contact Information Primary Emergency Contact: [facility] Address: [address]          [place], [place] [address] [name] of [place] Phone: [phone] Mobile Phone: [phone] Relation: Spouse Secondary Emergency Contact: [name] Mobile Phone: [phone] Relation: Daughter Preferred language: [name] Interpreter needed? No  Code Status: DNR Goals of care: Advanced Directive information    11/23/2022   11:38 AM  Advanced Directives  Does Patient Have a Medical Advance Directive? Yes  Type of Advance Directive Living will;Out of facility DNR (pink MOST or yellow form)  Does patient want to make changes to medical advance directive? No - Patient declined  Pre-existing out of facility DNR order (yellow form or pink MOST form) Yellow form placed in chart (order not valid for inpatient use)     Chief Complaint  Patient presents with   Medical Management of Chronic Issues    Routine follow up visit   Quality Metric Gaps    Medicare annual wellness due    HPI:  Pt is a 88 y.o. female seen today for an acute visit for reported paranoia episodes, thought her husband has an affair since the patient's husband's hospitalized and spending less time with her.  Memory loss, progressing.              Dry eyes, uses artificial tears.  Prolapsed rectum, avoid constipation,  usually reduced on its own.              Hx of rena lithiasis, recurrent UTI, urosepsis, treated by ID. 09/19/21 cystoscopy, laser lithotripsy, R ureteral stent/dilatation. Occasional dysuria present. Positive urine culture.              GERD, takes Pantoprazole, Famotidine, yearly FOBT per GI, Hgb 11.4 09/25/22             Parkinson's, takes Sinemet, MiraPex f/u Dr. Carles Collet, w/c for mobility most of time.              RLS stable on MiraPex, Sinemet hs, better sleep at night.              Depression/hallucination, takes Sertraline,  Quetiapine. Change Tramadol to prn helped. TSH 2.21 09/25/22             Constipation, takes Senokot S, Amitiza, Colace, MiraLax              Hypothyroidism, takes Levothyroxine TSH 2.21 09/25/22             Erosive arthritis, aches and pains,  takes Plaquenil, Tylenol  f/u Rheumatology.              CHF/trace edema BLE, takes Furosemide, cardiology, severe aortic valve stenosis. Echocardiogram 11/15/20 EF 60-65%. Bun/creat 17/0.97 09/25/22             Hyperlipidemia, LDL 64 01/11/22, on Rosuvastatin.              Anemia, takes Fe, Hgb 11.4 09/25/22  AS/CAD Cardiology palliative approach. On Midodrine per Cardiology. More noticeable DOE, fatigue.       Past Medical History:  Diagnosis Date   Abnormal nuclear stress test    Actinic cheilitis 04/23/2019   Anemia    iron deficiency    Anxiety    Aortic stenosis    severe by echo 2021    Arthritis    Broken arm    right    Chronic diastolic CHF (congestive heart failure) ([place])    Chronic pain    Cognitive communication deficit    Cognitive deficits    Communication   Constipation    Depression    Erosive (osteo)arthritis 03/09/2020   Family history of adverse reaction to anesthesia    pt. states sister vomits   Fibromyalgia    GERD (gastroesophageal reflux disease)    H/O hiatal hernia    Hallucinations, visual 07/21/2018   03/10/20 psych consult.    Hematuria 05/18/2020   History of bronchitis     History of kidney stones    Hypercholesterolemia    Hypertension    dr t turner   Hypothyroidism    Memory loss 04/21/2018   08/08/18 CT head no traumatic findings, atrophy.  09/27/19 MMSE 25/30, failed the clock drawing   Microhematuria 03/24/2015   Mild CAD    Neuropathy    Osteoporosis    Parkinson disease    PVD (peripheral vascular disease) (HCC)    99% stenosis of left anteiror tibial artery, mod stenosis of left distal SFA and popliteal artery followed by [name]   RBBB    noted on EKG 2018   Restless leg syndrome    Sepsis ([place])    hx of due to Ecoli   Septic shock ([place]) 04/19/2020   Shortness of breath    with exertion - chronic    Sjogren's disease ([place])    Sjogren's syndrome ([place]) 02/04/2018   04/16/19 rheumatology: Erosive OA of hands, Sjogrens syndrome, low back pain at multiple sites, recurrent kidney stones, age related osteoporosis w/o current pathological fracture, f/u 6 months.    SOB (shortness of breath)    chronic due to diastolic dysfunction, deconditioning, obesity   Spinal stenosis    lumbar region    Tremor    Unstable gait 04/21/2018   Unsteadiness on feet    Urinary frequency 09/18/2018   Urinary tract infection    Visual hallucinations    Past Surgical History:  Procedure Laterality Date   ABDOMINAL HYSTERECTOMY     BACK SURGERY     4 back surgeries,   lumbar fusion   CARDIAC CATHETERIZATION  2006   normal   CYSTOSCOPY/URETEROSCOPY/HOLMIUM LASER/STENT PLACEMENT Left 01/06/2019   Procedure: CYSTOSCOPY/RETROGRADE/URETEROSCOPY/HOLMIUM LASER/BASKET RETRIEVAL/STENT PLACEMENT;  Surgeon: Ceasar Mons, MD;  Location: [facility];  Service: Urology;  Laterality: Left;   CYSTOSCOPY/URETEROSCOPY/HOLMIUM LASER/STENT PLACEMENT Bilateral 05/24/2020   Procedure: CYSTOSCOPY/RETROGRADE/URETEROSCOPY/HOLMIUM LASER/STENT PLACEMENT;  Surgeon: Ceasar Mons, MD;  Location: [facility];  Service: Urology;  Laterality: Bilateral;    CYSTOSCOPY/URETEROSCOPY/HOLMIUM LASER/STENT PLACEMENT Right 09/27/2021   Procedure: CYSTOSCOPY/BILATERAL RETROGRADE/URETEROSCOPY/HOLMIUM LASER/STENT PLACEMENT;  Surgeon: Ceasar Mons, MD;  Location: [facility];  Service: Urology;  Laterality: Right;  ONLY NEEDS 60 MIN   EYE SURGERY Bilateral    cateracts   falls     variious fall, broken wrist,and toes   FRACTURE SURGERY Right April 2016   Wrist, Pt. fell   IR NEPHROSTOMY PLACEMENT RIGHT  04/19/2020   kidney stone removal Left 01/20/2019   RIGHT/LEFT  HEART CATH AND CORONARY ANGIOGRAPHY N/A 02/05/2018   Procedure: RIGHT/LEFT HEART CATH AND CORONARY ANGIOGRAPHY;  Surgeon: Troy Sine, MD;  Location: [facility];  Service: Cardiovascular;  Laterality: N/A;   SPINAL CORD STIMULATOR INSERTION N/A 09/09/2015   Procedure: LUMBAR SPINAL CORD STIMULATOR INSERTION;  Surgeon: Clydell Hakim, MD;  Location: [facility];  Service: Neurosurgery;  Laterality: N/A;  LUMBAR SPINAL CORD STIMULATOR INSERTION   SPINE SURGERY  April 2013   Back X's 4   TOTAL HIP ARTHROPLASTY     right   WRIST FRACTURE SURGERY Bilateral     Allergies  Allergen Reactions   Phosphate     Other reaction(s): Unknown   Adrenalone    Codeine Other (See Comments)    Reaction:  Headaches and nightmares  Other reaction(s): Unknown   Lactose Other (See Comments)    abd pain, lactose intolerant   Lactose Intolerance (Gi) Nausea And Vomiting   Latex Rash   Lyrica [Pregabalin] Swelling and Other (See Comments)    Reaction:  Leg swelling   Morphine Other (See Comments)   Other Other (See Comments)    Pt states that pain medications give her nightmares.     Plaquenil [Hydroxychloroquine Sulfate] Other (See Comments)    Reaction:  GI upset    Reglan [Metoclopramide] Other (See Comments)    Reaction:  GI upset    Requip [Ropinirole Hcl] Other (See Comments)    Reaction:  GI upset    Septra [Sulfamethoxazole-Trimethoprim] Nausea And Vomiting   Shellfish Allergy  Nausea And Vomiting   Sulfa Antibiotics Other (See Comments)    Headache, very sick    Allergies as of 11/23/2022       Reactions   Phosphate    Other reaction(s): Unknown   Adrenalone    Codeine Other (See Comments)   Reaction:  Headaches and nightmares  Other reaction(s): Unknown   Lactose Other (See Comments)   abd pain, lactose intolerant   Lactose Intolerance (gi) Nausea And Vomiting   Latex Rash   Lyrica [pregabalin] Swelling, Other (See Comments)   Reaction:  Leg swelling   Morphine Other (See Comments)   Other Other (See Comments)   Pt states that pain medications give her nightmares.     Plaquenil [hydroxychloroquine Sulfate] Other (See Comments)   Reaction:  GI upset    Reglan [metoclopramide] Other (See Comments)   Reaction:  GI upset    Requip [ropinirole Hcl] Other (See Comments)   Reaction:  GI upset    Septra [sulfamethoxazole-trimethoprim] Nausea And Vomiting   Shellfish Allergy Nausea And Vomiting   Sulfa Antibiotics Other (See Comments)   Headache, very sick        Medication List        Accurate as of November 23, 2022  2:25 PM. If you have any questions, ask your nurse or doctor.          STOP taking these medications    saccharomyces boulardii 250 MG capsule Commonly known as: FLORASTOR Stopped by: Ryo Klang X Shar Paez, NP       TAKE these medications    acetaminophen 500 MG tablet Commonly known as: TYLENOL Take 500 mg by mouth See admin instructions. Take 1 tablet (500 mg) by mouth scheduled 3 times daily scheduled & 3 times daily as needed for pain (Not to exceed 3g/24 hrs)   aspirin 81 MG chewable tablet Chew 81 mg by mouth daily.   Calcium 600/Vitamin D3 600-20 MG-MCG Tabs Generic drug: Calcium  Carb-Cholecalciferol Take 1 tablet by mouth daily.   carbidopa-levodopa 25-100 MG tablet Commonly known as: SINEMET IR Take 1 tablet by mouth 3 (three) times daily.   Carbidopa-Levodopa ER 25-100 MG tablet controlled release Commonly  known as: SINEMET CR Take 1 tablet by mouth daily.   docusate sodium 100 MG capsule Commonly known as: COLACE Take 100 mg by mouth at bedtime.   eucerin cream Apply 1 application topically daily.   famotidine 20 MG tablet Commonly known as: PEPCID Take 20 mg by mouth daily.   furosemide 20 MG tablet Commonly known as: LASIX Take 1 tablet (20 mg total) by mouth daily.   hydroxychloroquine 200 MG tablet Commonly known as: PLAQUENIL Take 200 mg by mouth daily.   lactose free nutrition Liqd Take by mouth 2 (two) times daily. 1/2 box; Resident likes chocolate. Twice A Day   levothyroxine 50 MCG tablet Commonly known as: SYNTHROID Take 50 mcg by mouth daily before breakfast.   loperamide 2 MG tablet Commonly known as: IMODIUM A-D Take 2 mg by mouth every 6 (six) hours as needed for diarrhea or loose stools.   lubiprostone 24 MCG capsule Commonly known as: AMITIZA Take 24 mcg by mouth 2 (two) times daily with a meal.   midodrine 5 MG tablet Commonly known as: PROAMATINE Take 1 tablet (5 mg total) by mouth 3 (three) times daily with meals.   multivitamin with minerals Tabs tablet Take 1 tablet by mouth daily.   nystatin powder Generic drug: nystatin Apply 1 Application topically daily. ABDOMINAL SKIN FOLD, UNDER BREAST AND GROIN   pantoprazole 40 MG tablet Commonly known as: PROTONIX Take 40 mg by mouth daily.   polyethylene glycol 17 g packet Commonly known as: MIRALAX / GLYCOLAX Take 17 g by mouth daily.   potassium citrate 10 MEQ (1080 MG) SR tablet Commonly known as: UROCIT-K Take 10 mEq by mouth 2 (two) times daily.   pramipexole 0.125 MG tablet Commonly known as: MIRAPEX Take 0.125 mg by mouth at bedtime.   PRESERVISION AREDS 2 PO Take 1 tablet by mouth daily.   QUEtiapine 25 MG tablet Commonly known as: SEROQUEL Take 1 tablet (25 mg total) by mouth 2 (two) times daily.   rosuvastatin 10 MG tablet Commonly known as: CRESTOR Take 10 mg by mouth  daily.   senna-docusate 8.6-50 MG tablet Commonly known as: Senokot-S Take 1 tablet by mouth at bedtime.   sertraline 50 MG tablet Commonly known as: ZOLOFT Take 100 mg by mouth at bedtime.   Slow Fe 142 (45 Fe) MG Tbcr Generic drug: Ferrous Sulfate Take 142 mg by mouth in the morning.   Systane 0.4-0.3 % Soln Generic drug: Polyethyl Glycol-Propyl Glycol Apply 1 drop to eye 2 (two) times daily as needed (dry eyes).   Vitamin D 50 MCG (2000 UT) tablet Take 2,000 Units by mouth daily.        Review of Systems  Constitutional:  Positive for unexpected weight change. Negative for activity change, appetite change and fever.  HENT:  Positive for hearing loss. Negative for congestion and voice change.   Eyes:  Negative for visual disturbance.       Dry eyes.   Respiratory:  Positive for shortness of breath. Negative for cough.        Chronic DOE  Cardiovascular:  Positive for leg swelling.  Gastrointestinal:  Negative for abdominal pain and constipation.  Genitourinary:  Positive for frequency. Negative for dysuria and urgency.       Urinary  leakage  Musculoskeletal:  Positive for arthralgias, back pain, gait problem and myalgias.       Chronic lower back pain. The R 3rd finger mid knuckle pain is chronic.   Skin:  Negative for color change.  Neurological:  Positive for tremors. Negative for speech difficulty, weakness and headaches.       Memory lapses. Minimal resting tremor in fingers, not disabling. RLS occasional symptomatic.   Psychiatric/Behavioral:  Positive for agitation, behavioral problems, dysphoric mood and hallucinations. Negative for sleep disturbance. The patient is not nervous/anxious.        Paranoia     Immunization History  Administered Date(s) Administered   Hepatitis A, Adult 03/01/1997, 09/20/1997   Influenza Split 09/09/2013, 09/27/2014   Influenza, High Dose Seasonal PF 08/01/2011, 08/27/2012, 10/14/2013, 09/08/2015, 09/10/2016, 09/12/2017,  09/12/2018, 09/15/2019   Influenza,inj,Quad PF,6+ Mos 09/11/2016   Influenza-Unspecified 09/21/2020, 09/28/2021, 10/03/2022   Moderna SARS-COV2 Booster Vaccination 04/27/2022, 10/11/2022   Moderna Sars-Covid-2 Vaccination 12/12/2019, 01/09/2020, 10/18/2020, 05/09/2021, 08/29/2021   Pneumococcal Conjugate-13 04/15/2014   Pneumococcal Polysaccharide-23 09/28/2005   Td 06/20/2016   Tdap 04/29/2006, 09/01/2015   Zoster Recombinat (Shingrix) 06/11/2018, 09/10/2018   Zoster, Live 04/29/2006   Pertinent  Health Maintenance Due  Topic Date Due   INFLUENZA VACCINE  Completed   DEXA SCAN  Completed      09/27/2021    7:24 AM 10/10/2021   11:15 AM 03/13/2022    2:41 PM 09/25/2022    2:40 PM 09/28/2022   11:43 AM  Fall Risk  Falls in the past year?  1 0 0 0  Was there an injury with Fall?  0 0  0  Fall Risk Category Calculator  2 0  0  Fall Risk Category  Moderate Low  Low  Patient Fall Risk Level High fall risk Moderate fall risk     Patient at Risk for Falls Due to     History of fall(s)  Fall risk Follow up     Falls evaluation completed   Functional Status Survey:    Vitals:   11/23/22 1135  BP: 118/70  Pulse: 86  Resp: 17  Temp: 97.6 F (36.4 C)  SpO2: 96%  Weight: 118 lb 12.8 oz (53.9 kg)  Height: _0  (1.422 m)   Body mass index is 26.63 kg/m. Physical Exam Vitals and nursing note reviewed.  Constitutional:      Comments: Sad facial looks, gradual weight loss.   HENT:     Head: Normocephalic and atraumatic.     Nose: Nose normal.     Mouth/Throat:     Mouth: Mucous membranes are moist.  Eyes:     Extraocular Movements: Extraocular movements intact.     Conjunctiva/sclera: Conjunctivae normal.     Pupils: Pupils are equal, round, and reactive to light.  Cardiovascular:     Rate and Rhythm: Normal rate and regular rhythm.     Heart sounds: Murmur heard.  Pulmonary:     Effort: Pulmonary effort is normal.     Breath sounds: No rales.  Abdominal:     General:  Bowel sounds are normal.     Palpations: Abdomen is soft.     Tenderness: There is no abdominal tenderness.     Comments: Suprapubic region discomfort when palpated.   Genitourinary:    Comments: Hx of reported protruded rectum was reduced on its own, a external hemorrhoid at 7pm, no s/s of injury.  Musculoskeletal:        General: Tenderness present.  Cervical back: Normal range of motion and neck supple.     Right lower leg: Edema present.     Left lower leg: Edema present.     Comments: Trace edema BLE.  Arthritic changes in fingers, R>L. The R 3rd finger mid knuckle swelling, no heat or redness. Chronic lower back pain.   Skin:    General: Skin is warm and dry.  Neurological:     General: No focal deficit present.     Mental Status: She is alert. Mental status is at baseline.     Gait: Gait abnormal.     Comments: Oriented to person, place. Ambulates with walker.   Psychiatric:     Comments: Visual hallucination vs delusion, new behaviors-stated her husband has an affair.      Labs reviewed: Recent Labs    01/11/22 0000 03/31/22 0000 09/25/22 0000  NA 141 140 143  K 4.4 4.6 4.4  CL 106 108 107  CO2 24* 23* 28*  BUN _0 CREATININE 1.0 1.0 1.0  CALCIUM 9.5 9.4 10.1   Recent Labs    01/11/22 0000 03/31/22 0000 09/25/22 0000  AST  --  18 24  ALT  --  12 14  ALKPHOS  --  76 76  ALBUMIN 3.8 3.6 4.2   Recent Labs    03/31/22 0000 09/25/22 0000  WBC 8.8 6.7  NEUTROABS 5,342.00  --   HGB 9.6* 11.4*  HCT 30* 35*  PLT 248 288   Lab Results  Component Value Date   TSH 2.21 09/25/2022   No results found for: "HGBA1C" Lab Results  Component Value Date   CHOL 137 01/11/2022   HDL 47 01/11/2022   LDLCALC 64 01/11/2022   TRIG 184 (A) 01/11/2022   CHOLHDL 2.4 07/14/2019    Significant Diagnostic Results in last 30 days:  No results found.  Assessment/Plan: Depression, psychotic ([place]) reported paranoia episodes, thought her husband has an affair  since the patient's husband's hospitalized and spending less time with her. Will increase Sertraline 165m qd, continue Quetiapine. Change Tramadol to prn helped. TSH 2.21 09/25/22. Update CBC/diff, CMP/eGFR  Chronic constipation Stable, takes Senokot S, Amitiza, Colace, MiraLax   Hypothyroidism takes Levothyroxine TSH 2.21 09/25/22  Erosive (osteo)arthritis  aches and pains,  takes Plaquenil, Tylenol  f/u Rheumatology.   (HFpEF) heart failure with preserved ejection fraction (HCC) trace edema BLE, takes Furosemide, cardiology, severe aortic valve stenosis. Echocardiogram 11/15/20 EF 60-65%. Bun/creat 17/0.97 09/25/22  Hypercholesterolemia LDL 64 01/11/22, on Rosuvastatin.   Iron deficiency anemia  takes Fe, Hgb 11.4 09/25/22  Severe aortic stenosis - not a candidate for TAVR Cardiology palliative approach. On Midodrine per Cardiology. More noticeable DOE, fatigue.   Restless leg syndrome stable on MiraPex, Sinemet hs, better sleep at night.   [place]inson's disease [place] Sinemet, MiraPex f/u Dr. TCarles Collet w/c for mobility most of time.   GERD (gastroesophageal reflux disease) takes Pantoprazole, Famotidine, yearly FOBT per GI, Hgb 11.4 09/25/22    Family/ staff Communication: plan of care reviewed with the patient, the patient's HPOA husband, and charge nurse  Labs/tests ordered:  CBC/diff, CMP/eGFR  Time spend 35 minutes.

## 2022-11-23 NOTE — Assessment & Plan Note (Signed)
stable on MiraPex, Sinemet hs, better sleep at night.

## 2022-11-23 NOTE — Assessment & Plan Note (Signed)
trace edema BLE, takes Furosemide, cardiology, severe aortic valve stenosis. Echocardiogram 11/15/20 EF 60-65%. Bun/creat 17/0.97 09/25/22

## 2022-11-23 NOTE — Assessment & Plan Note (Addendum)
reported paranoia episodes, thought her husband has an affair since the patient's husband's hospitalized and spending less time with her. Will increase Sertraline 125mg qd, continue Quetiapine. Change Tramadol to prn helped. TSH 2.21 09/25/22. Update CBC/diff, CMP/eGFR 

## 2022-11-23 NOTE — Assessment & Plan Note (Signed)
LDL 64 01/11/22, on Rosuvastatin.  

## 2022-11-23 NOTE — Progress Notes (Deleted)
Location:   [facility] Room Number: 45 A Place of Service:  SNF (31) Provider:  [name], Man Darlina Rumpf, NP  [name], Man X, NP  Patient Care Team: [name], Man X, NP as PCP - General (Internal Medicine) [name], MD as PCP - Cardiology (Cardiology) [name], MD (Neurosurgery) [name], MD (Neurology) [name], MD (Inactive) as Consulting Physician (Gastroenterology) [name], MD as Consulting Physician (Urology) [name], Man X, NP as Nurse Practitioner (Internal Medicine) Tat, Eustace Quail, DO as Consulting Physician (Neurology)  Extended Emergency Contact Information Primary Emergency Contact: [facility] Address: [address]          [place], [place] [address] [name] of [place] Phone: [phone] Mobile Phone: [phone] Relation: Spouse Secondary Emergency Contact: [name] Mobile Phone: [phone] Relation: Daughter Preferred language: [name] Interpreter needed? No  Code Status:  DNR Goals of care: Advanced Directive information    11/23/2022   11:38 AM  Advanced Directives  Does Patient Have a Medical Advance Directive? Yes  Type of Advance Directive Living will;Out of facility DNR (pink MOST or yellow form)  Does patient want to make changes to medical advance directive? No - Patient declined  Pre-existing out of facility DNR order (yellow form or pink MOST form) Yellow form placed in chart (order not valid for inpatient use)     Chief Complaint  Patient presents with   Medical Management of Chronic Issues    Routine follow up visit   Quality Metric Gaps    Medicare annual wellness due    HPI:  Pt is a 88 y.o. female seen today for medical management of chronic diseases.     Past Medical History:  Diagnosis Date   Abnormal nuclear stress test    Actinic cheilitis 04/23/2019   Anemia    iron deficiency    Anxiety    Aortic stenosis    severe by echo 2021    Arthritis     Broken arm    right    Chronic diastolic CHF (congestive heart failure) ([place])    Chronic pain    Cognitive communication deficit    Cognitive deficits    Communication   Constipation    Depression    Erosive (osteo)arthritis 03/09/2020   Family history of adverse reaction to anesthesia    pt. states sister vomits   Fibromyalgia    GERD (gastroesophageal reflux disease)    H/O hiatal hernia    Hallucinations, visual 07/21/2018   03/10/20 psych consult.    Hematuria 05/18/2020   History of bronchitis    History of kidney stones    Hypercholesterolemia    Hypertension    dr t turner   Hypothyroidism    Memory loss 04/21/2018   08/08/18 CT head no traumatic findings, atrophy.  09/27/19 MMSE 25/30, failed the clock drawing   Microhematuria 03/24/2015   Mild CAD    Neuropathy    Osteoporosis    Parkinson disease    PVD (peripheral vascular disease) (H[facility])    99% stenosis of left anteiror tibial artery, mod stenosis of left distal SFA and popliteal artery followed by [name]   RBBB    noted on EKG 2018   Restless leg syndrome    Sepsis ([place])    hx of due to [facility]   Septic shock ([place]) 04/19/2020   Shortness of breath    with exertion - chronic    Sjogren's disease ([place])    Sjogren's syndrome (  [place]) 02/04/2018   04/16/19 rheumatology: Erosive OA of hands, Sjogrens syndrome, low back pain at multiple sites, recurrent kidney stones, age related osteoporosis w/o current pathological fracture, f/u 6 months.    SOB (shortness of breath)    chronic due to diastolic dysfunction, deconditioning, obesity   Spinal stenosis    lumbar region    Tremor    Unstable gait 04/21/2018   Unsteadiness on feet    Urinary frequency 09/18/2018   Urinary tract infection    Visual hallucinations    Past Surgical History:  Procedure Laterality Date   ABDOMINAL HYSTERECTOMY     BACK SURGERY     4 back surgeries,   lumbar fusion   CARDIAC CATHETERIZATION  2006   normal    CYSTOSCOPY/URETEROSCOPY/HOLMIUM LASER/STENT PLACEMENT Left 01/06/2019   Procedure: CYSTOSCOPY/RETROGRADE/URETEROSCOPY/HOLMIUM LASER/BASKET RETRIEVAL/STENT PLACEMENT;  Surgeon: Ceasar Mons, MD;  Location: [facility];  Service: Urology;  Laterality: Left;   CYSTOSCOPY/URETEROSCOPY/HOLMIUM LASER/STENT PLACEMENT Bilateral 05/24/2020   Procedure: CYSTOSCOPY/RETROGRADE/URETEROSCOPY/HOLMIUM LASER/STENT PLACEMENT;  Surgeon: Ceasar Mons, MD;  Location: [facility];  Service: Urology;  Laterality: Bilateral;   CYSTOSCOPY/URETEROSCOPY/HOLMIUM LASER/STENT PLACEMENT Right 09/27/2021   Procedure: CYSTOSCOPY/BILATERAL RETROGRADE/URETEROSCOPY/HOLMIUM LASER/STENT PLACEMENT;  Surgeon: Ceasar Mons, MD;  Location: [facility];  Service: Urology;  Laterality: Right;  ONLY NEEDS 60 MIN   EYE SURGERY Bilateral    cateracts   falls     variious fall, broken wrist,and toes   FRACTURE SURGERY Right April 2016   Wrist, Pt. fell   IR NEPHROSTOMY PLACEMENT RIGHT  04/19/2020   kidney stone removal Left 01/20/2019   RIGHT/LEFT HEART CATH AND CORONARY ANGIOGRAPHY N/A 02/05/2018   Procedure: RIGHT/LEFT HEART CATH AND CORONARY ANGIOGRAPHY;  Surgeon: Troy Sine, MD;  Location: [facility];  Service: Cardiovascular;  Laterality: N/A;   SPINAL CORD STIMULATOR INSERTION N/A 09/09/2015   Procedure: LUMBAR SPINAL CORD STIMULATOR INSERTION;  Surgeon: Clydell Hakim, MD;  Location: [facility];  Service: Neurosurgery;  Laterality: N/A;  LUMBAR SPINAL CORD STIMULATOR INSERTION   SPINE SURGERY  April 2013   Back X's 4   TOTAL HIP ARTHROPLASTY     right   WRIST FRACTURE SURGERY Bilateral     Allergies  Allergen Reactions   Phosphate     Other reaction(s): Unknown   Adrenalone    Codeine Other (See Comments)    Reaction:  Headaches and nightmares  Other reaction(s): Unknown   Lactose Other (See Comments)    abd pain, lactose intolerant   Lactose Intolerance (Gi) Nausea And Vomiting   Latex  Rash   Lyrica [Pregabalin] Swelling and Other (See Comments)    Reaction:  Leg swelling   Morphine Other (See Comments)   Other Other (See Comments)    Pt states that pain medications give her nightmares.     Plaquenil [Hydroxychloroquine Sulfate] Other (See Comments)    Reaction:  GI upset    Reglan [Metoclopramide] Other (See Comments)    Reaction:  GI upset    Requip [Ropinirole Hcl] Other (See Comments)    Reaction:  GI upset    Septra [Sulfamethoxazole-Trimethoprim] Nausea And Vomiting   Shellfish Allergy Nausea And Vomiting   Sulfa Antibiotics Other (See Comments)    Headache, very sick    Allergies as of 11/23/2022       Reactions   Phosphate    Other reaction(s): Unknown   Adrenalone    Codeine Other (See Comments)   Reaction:  Headaches and nightmares  Other reaction(s): Unknown  Lactose Other (See Comments)   abd pain, lactose intolerant   Lactose Intolerance (gi) Nausea And Vomiting   Latex Rash   Lyrica [pregabalin] Swelling, Other (See Comments)   Reaction:  Leg swelling   Morphine Other (See Comments)   Other Other (See Comments)   Pt states that pain medications give her nightmares.     Plaquenil [hydroxychloroquine Sulfate] Other (See Comments)   Reaction:  GI upset    Reglan [metoclopramide] Other (See Comments)   Reaction:  GI upset    Requip [ropinirole Hcl] Other (See Comments)   Reaction:  GI upset    Septra [sulfamethoxazole-trimethoprim] Nausea And Vomiting   Shellfish Allergy Nausea And Vomiting   Sulfa Antibiotics Other (See Comments)   Headache, very sick        Medication List        Accurate as of November 23, 2022 11:54 AM. If you have any questions, ask your nurse or doctor.          STOP taking these medications    saccharomyces boulardii 250 MG capsule Commonly known as: FLORASTOR Stopped by: Man X [name], NP       TAKE these medications    acetaminophen 500 MG tablet Commonly known as: TYLENOL Take 500 mg by  mouth See admin instructions. Take 1 tablet (500 mg) by mouth scheduled 3 times daily scheduled & 3 times daily as needed for pain (Not to exceed 3g/24 hrs)   aspirin 81 MG chewable tablet Chew 81 mg by mouth daily.   Calcium 600/Vitamin D3 600-20 MG-MCG Tabs Generic drug: Calcium Carb-Cholecalciferol Take 1 tablet by mouth daily.   carbidopa-levodopa 25-100 MG tablet Commonly known as: SINEMET IR Take 1 tablet by mouth 3 (three) times daily.   Carbidopa-Levodopa ER 25-100 MG tablet controlled release Commonly known as: SINEMET CR Take 1 tablet by mouth daily.   docusate sodium 100 MG capsule Commonly known as: COLACE Take 100 mg by mouth at bedtime.   eucerin cream Apply 1 application topically daily.   famotidine 20 MG tablet Commonly known as: PEPCID Take 20 mg by mouth daily.   furosemide 20 MG tablet Commonly known as: LASIX Take 1 tablet (20 mg total) by mouth daily.   hydroxychloroquine 200 MG tablet Commonly known as: PLAQUENIL Take 200 mg by mouth daily.   lactose free nutrition Liqd Take by mouth 2 (two) times daily. 1/2 box; Resident likes chocolate. Twice A Day   levothyroxine 50 MCG tablet Commonly known as: SYNTHROID Take 50 mcg by mouth daily before breakfast.   loperamide 2 MG tablet Commonly known as: IMODIUM A-D Take 2 mg by mouth every 6 (six) hours as needed for diarrhea or loose stools.   lubiprostone 24 MCG capsule Commonly known as: AMITIZA Take 24 mcg by mouth 2 (two) times daily with a meal.   midodrine 5 MG tablet Commonly known as: PROAMATINE Take 1 tablet (5 mg total) by mouth 3 (three) times daily with meals.   multivitamin with minerals Tabs tablet Take 1 tablet by mouth daily.   nystatin powder Generic drug: nystatin Apply 1 Application topically daily. ABDOMINAL SKIN FOLD, UNDER BREAST AND GROIN   pantoprazole 40 MG tablet Commonly known as: PROTONIX Take 40 mg by mouth daily.   polyethylene glycol 17 g  packet Commonly known as: MIRALAX / GLYCOLAX Take 17 g by mouth daily.   potassium citrate 10 MEQ (1080 MG) SR tablet Commonly known as: UROCIT-K Take 10 mEq by mouth 2 (two) times  daily.   pramipexole 0.125 MG tablet Commonly known as: MIRAPEX Take 0.125 mg by mouth at bedtime.   PRESERVISION AREDS 2 PO Take 1 tablet by mouth daily.   QUEtiapine 25 MG tablet Commonly known as: SEROQUEL Take 1 tablet (25 mg total) by mouth 2 (two) times daily.   rosuvastatin 10 MG tablet Commonly known as: CRESTOR Take 10 mg by mouth daily.   senna-docusate 8.6-50 MG tablet Commonly known as: Senokot-S Take 1 tablet by mouth at bedtime.   sertraline 50 MG tablet Commonly known as: ZOLOFT Take 100 mg by mouth at bedtime.   Slow Fe 142 (45 Fe) MG Tbcr Generic drug: Ferrous Sulfate Take 142 mg by mouth in the morning.   Systane 0.4-0.3 % Soln Generic drug: Polyethyl Glycol-Propyl Glycol Apply 1 drop to eye 2 (two) times daily as needed (dry eyes).   Vitamin D 50 MCG (2000 UT) tablet Take 2,000 Units by mouth daily.        Review of Systems  Immunization History  Administered Date(s) Administered   Hepatitis A, Adult 03/01/1997, 09/20/1997   Influenza Split 09/09/2013, 09/27/2014   Influenza, High Dose Seasonal PF 08/01/2011, 08/27/2012, 10/14/2013, 09/08/2015, 09/10/2016, 09/12/2017, 09/12/2018, 09/15/2019   Influenza,inj,Quad PF,6+ Mos 09/11/2016   Influenza-Unspecified 09/21/2020, 09/28/2021, 10/03/2022   Moderna SARS-COV2 Booster Vaccination 04/27/2022, 10/11/2022   Moderna Sars-Covid-2 Vaccination 12/12/2019, 01/09/2020, 10/18/2020, 05/09/2021, 08/29/2021   Pneumococcal Conjugate-13 04/15/2014   Pneumococcal Polysaccharide-23 09/28/2005   Td 06/20/2016   Tdap 04/29/2006, 09/01/2015   Zoster Recombinat (Shingrix) 06/11/2018, 09/10/2018   Zoster, Live 04/29/2006   Pertinent  Health Maintenance Due  Topic Date Due   INFLUENZA VACCINE  Completed   DEXA SCAN  Completed       09/27/2021    7:24 AM 10/10/2021   11:15 AM 03/13/2022    2:41 PM 09/25/2022    2:40 PM 09/28/2022   11:43 AM  Fall Risk  Falls in the past year?  1 0 0 0  Was there an injury with Fall?  0 0  0  Fall Risk Category Calculator  2 0  0  Fall Risk Category  Moderate Low  Low  Patient Fall Risk Level High fall risk Moderate fall risk     Patient at Risk for Falls Due to     History of fall(s)  Fall risk Follow up     Falls evaluation completed   Functional Status Survey:    Vitals:   11/23/22 1135  BP: 118/70  Pulse: 86  Resp: 17  Temp: 97.6 F (36.4 C)  SpO2: 96%  Weight: 118 lb 12.8 oz (53.9 kg)  Height: '4\' 8"'$  (1.422 m)   Body mass index is 26.63 kg/m. Physical Exam  Labs reviewed: Recent Labs    01/11/22 0000 03/31/22 0000 09/25/22 0000  NA 141 140 143  K 4.4 4.6 4.4  CL 106 108 107  CO2 24* 23* 28*  BUN '20 20 17  '$ CREATININE 1.0 1.0 1.0  CALCIUM 9.5 9.4 10.1   Recent Labs    01/11/22 0000 03/31/22 0000 09/25/22 0000  AST  --  18 24  ALT  --  12 14  ALKPHOS  --  76 76  ALBUMIN 3.8 3.6 4.2   Recent Labs    03/31/22 0000 09/25/22 0000  WBC 8.8 6.7  NEUTROABS 5,342.00  --   HGB 9.6* 11.4*  HCT 30* 35*  PLT 248 288   Lab Results  Component Value Date   TSH 2.21 09/25/2022  No results found for: "HGBA1C" Lab Results  Component Value Date   CHOL 137 01/11/2022   HDL 47 01/11/2022   LDLCALC 64 01/11/2022   TRIG 184 (A) 01/11/2022   CHOLHDL 2.4 07/14/2019    Significant Diagnostic Results in last 30 days:  No results found.  Assessment/Plan There are no diagnoses linked to this encounter.   Family/ staff Communication:   Labs/tests ordered:

## 2022-11-23 NOTE — Assessment & Plan Note (Signed)
takes Fe, Hgb 11.4 09/25/22

## 2022-11-23 NOTE — Assessment & Plan Note (Signed)
takes Pantoprazole, Famotidine, yearly FOBT per GI, Hgb 11.4 09/25/22

## 2022-11-23 NOTE — Assessment & Plan Note (Signed)
aches and pains,  takes Plaquenil, Tylenol  f/u Rheumatology.

## 2022-11-23 NOTE — Assessment & Plan Note (Signed)
takes Sinemet, MiraPex f/u Dr. Tat, w/c for mobility most of time.  

## 2022-11-23 NOTE — Assessment & Plan Note (Signed)
Stable,  takes Senokot S, Amitiza, Colace, MiraLax  

## 2022-11-23 NOTE — Assessment & Plan Note (Signed)
takes Levothyroxine TSH 2.21 09/25/22 

## 2022-11-26 DIAGNOSIS — R441 Visual hallucinations: Secondary | ICD-10-CM | POA: Diagnosis not present

## 2022-11-26 DIAGNOSIS — F411 Generalized anxiety disorder: Secondary | ICD-10-CM | POA: Diagnosis not present

## 2022-11-26 DIAGNOSIS — F323 Major depressive disorder, single episode, severe with psychotic features: Secondary | ICD-10-CM | POA: Diagnosis not present

## 2022-11-27 ENCOUNTER — Encounter: Payer: Self-pay | Admitting: Nurse Practitioner

## 2022-11-27 ENCOUNTER — Non-Acute Institutional Stay (SKILLED_NURSING_FACILITY): Payer: Medicare Other | Admitting: Nurse Practitioner

## 2022-11-27 DIAGNOSIS — D649 Anemia, unspecified: Secondary | ICD-10-CM | POA: Diagnosis not present

## 2022-11-27 DIAGNOSIS — E785 Hyperlipidemia, unspecified: Secondary | ICD-10-CM | POA: Diagnosis not present

## 2022-11-27 DIAGNOSIS — Z0000 Encounter for general adult medical examination without abnormal findings: Secondary | ICD-10-CM | POA: Diagnosis not present

## 2022-11-27 DIAGNOSIS — I10 Essential (primary) hypertension: Secondary | ICD-10-CM | POA: Diagnosis not present

## 2022-11-27 NOTE — Progress Notes (Signed)
Subjective:   [name] is a 88 y.o. female who presents for Medicare Annual (Subsequent) preventive examination @ [facility].      Objective:    Today's Vitals   11/27/22 1037 11/27/22 1411  BP: 118/70   Resp: 17   Temp: 97.6 F (36.4 C)   SpO2: 96%   Weight: 116 lb 9.6 oz (52.9 kg)   Height: '4\' 8"'$  (1.422 m)   PainSc:  5    Body mass index is 26.14 kg/m.     11/27/2022   10:39 AM 11/23/2022   11:38 AM 10/30/2022   12:14 PM 09/28/2022   11:44 AM 09/25/2022    2:40 PM 08/03/2022   10:32 AM 06/05/2022   10:27 AM  Advanced Directives  Does Patient Have a Medical Advance Directive? Yes Yes Yes Yes Yes Yes Yes  Type of Advance Directive Living will;Out of facility DNR (pink MOST or yellow form) Living will;Out of facility DNR (pink MOST or yellow form) Living will;Out of facility DNR (pink MOST or yellow form) Out of facility DNR (pink MOST or yellow form) Living will Out of facility DNR (pink MOST or yellow form) Living will;Out of facility DNR (pink MOST or yellow form)  Does patient want to make changes to medical advance directive? No - Patient declined No - Patient declined No - Patient declined No - Patient declined  No - Patient declined No - Patient declined  Copy of [place] in Chart?       Yes - validated most recent copy scanned in chart (See row information)  Pre-existing out of facility DNR order (yellow form or pink MOST form) Yellow form placed in chart (order not valid for inpatient use) Yellow form placed in chart (order not valid for inpatient use) Yellow form placed in chart (order not valid for inpatient use) Pink MOST/Yellow Form most recent copy in chart - Physician notified to receive inpatient order  Yellow form placed in chart (order not valid for inpatient use) Yellow form placed in chart (order not valid for inpatient use);Pink MOST form placed in chart (order not valid for inpatient use)    Current Medications  (verified) Outpatient Encounter Medications as of 11/27/2022  Medication Sig   acetaminophen (TYLENOL) 500 MG tablet Take 500 mg by mouth See admin instructions. Take 1 tablet (500 mg) by mouth scheduled 3 times daily scheduled & 3 times daily as needed for pain (Not to exceed 3g/24 hrs)   aspirin 81 MG chewable tablet Chew 81 mg by mouth daily.   Calcium Carb-Cholecalciferol (CALCIUM 600/VITAMIN D3) 600-800 MG-UNIT TABS Take 1 tablet by mouth daily.   carbidopa-levodopa (SINEMET IR) 25-100 MG tablet Take 1 tablet by mouth 3 (three) times daily.   Carbidopa-Levodopa ER (SINEMET CR) 25-100 MG tablet controlled release Take 1 tablet by mouth daily.   Cholecalciferol (VITAMIN D) 50 MCG (2000 UT) tablet Take 2,000 Units by mouth daily.   docusate sodium (COLACE) 100 MG capsule Take 100 mg by mouth at bedtime.   famotidine (PEPCID) 20 MG tablet Take 20 mg by mouth daily.   Ferrous Sulfate (SLOW FE) 142 (45 Fe) MG TBCR Take 142 mg by mouth in the morning.   furosemide (LASIX) 20 MG tablet Take 1 tablet (20 mg total) by mouth daily.   hydroxychloroquine (PLAQUENIL) 200 MG tablet Take 200 mg by mouth daily.   lactose free nutrition (BOOST) LIQD Take by mouth 2 (two) times daily. 1/2 box; Resident likes  chocolate. Twice A Day   levothyroxine (SYNTHROID, LEVOTHROID) 50 MCG tablet Take 50 mcg by mouth daily before breakfast.    loperamide (IMODIUM A-D) 2 MG tablet Take 2 mg by mouth every 6 (six) hours as needed for diarrhea or loose stools.   lubiprostone (AMITIZA) 24 MCG capsule Take 24 mcg by mouth 2 (two) times daily with a meal.    midodrine (PROAMATINE) 5 MG tablet Take 1 tablet (5 mg total) by mouth 3 (three) times daily with meals.   Multiple Vitamin (MULTIVITAMIN WITH MINERALS) TABS tablet Take 1 tablet by mouth daily.   Multiple Vitamins-Minerals (PRESERVISION AREDS 2 PO) Take 1 tablet by mouth daily.   nystatin powder Apply 1 Application topically daily. ABDOMINAL SKIN FOLD, UNDER BREAST AND  GROIN   pantoprazole (PROTONIX) 40 MG tablet Take 40 mg by mouth daily.   Polyethyl Glycol-Propyl Glycol (SYSTANE) 0.4-0.3 % SOLN Apply 1 drop to eye 2 (two) times daily as needed (dry eyes).    polyethylene glycol (MIRALAX / GLYCOLAX) 17 g packet Take 17 g by mouth daily.   potassium citrate (UROCIT-K) 10 MEQ (1080 MG) SR tablet Take 10 mEq by mouth 2 (two) times daily.   pramipexole (MIRAPEX) 0.125 MG tablet Take 0.125 mg by mouth at bedtime.   QUEtiapine (SEROQUEL) 25 MG tablet Take 1 tablet (25 mg total) by mouth 2 (two) times daily.   rosuvastatin (CRESTOR) 10 MG tablet Take 10 mg by mouth daily.   senna-docusate (SENOKOT-S) 8.6-50 MG tablet Take 1 tablet by mouth at bedtime.    sertraline (ZOLOFT) 50 MG tablet Take 100 mg by mouth at bedtime.   Skin Protectants, Misc. (EUCERIN) cream Apply 1 application topically daily.   No facility-administered encounter medications on file as of 11/27/2022.    Allergies (verified) Phosphate, Adrenalone, Codeine, Lactose, Lactose intolerance (gi), Latex, Lyrica [pregabalin], Morphine, Other, Plaquenil [hydroxychloroquine sulfate], Reglan [metoclopramide], Requip [ropinirole hcl], Septra [sulfamethoxazole-trimethoprim], Shellfish allergy, and Sulfa antibiotics   History: Past Medical History:  Diagnosis Date   Abnormal nuclear stress test    Actinic cheilitis 04/23/2019   Anemia    iron deficiency    Anxiety    Aortic stenosis    severe by echo 2021    Arthritis    Broken arm    right    Chronic diastolic CHF (congestive heart failure) (HCC)    Chronic pain    Cognitive communication deficit    Cognitive deficits    Communication   Constipation    Depression    Erosive (osteo)arthritis 03/09/2020   Family history of adverse reaction to anesthesia    pt. states sister vomits   Fibromyalgia    GERD (gastroesophageal reflux disease)    H/O hiatal hernia    Hallucinations, visual 07/21/2018   03/10/20 psych consult.    Hematuria  05/18/2020   History of bronchitis    History of kidney stones    Hypercholesterolemia    Hypertension    dr t turner   Hypothyroidism    Memory loss 04/21/2018   08/08/18 CT head no traumatic findings, atrophy.  09/27/19 MMSE 25/30, failed the clock drawing   Microhematuria 03/24/2015   Mild CAD    Neuropathy    Osteoporosis    Parkinson disease    PVD (peripheral vascular disease) (HCC)    99% stenosis of left anteiror tibial artery, mod stenosis of left distal SFA and popliteal artery followed by [name]   RBBB    noted on EKG 2018  Restless leg syndrome    Sepsis (HCC)    hx of due to Ecoli   Septic shock ([place]) 04/19/2020   Shortness of breath    with exertion - chronic    Sjogren's disease ([place])    Sjogren's syndrome ([place]) 02/04/2018   04/16/19 rheumatology: Erosive OA of hands, Sjogrens syndrome, low back pain at multiple sites, recurrent kidney stones, age related osteoporosis w/o current pathological fracture, f/u 6 months.    SOB (shortness of breath)    chronic due to diastolic dysfunction, deconditioning, obesity   Spinal stenosis    lumbar region    Tremor    Unstable gait 04/21/2018   Unsteadiness on feet    Urinary frequency 09/18/2018   Urinary tract infection    Visual hallucinations    Past Surgical History:  Procedure Laterality Date   ABDOMINAL HYSTERECTOMY     BACK SURGERY     4 back surgeries,   lumbar fusion   CARDIAC CATHETERIZATION  2006   normal   CYSTOSCOPY/URETEROSCOPY/HOLMIUM LASER/STENT PLACEMENT Left 01/06/2019   Procedure: CYSTOSCOPY/RETROGRADE/URETEROSCOPY/HOLMIUM LASER/BASKET RETRIEVAL/STENT PLACEMENT;  Surgeon: Ceasar Mons, MD;  Location: [facility];  Service: Urology;  Laterality: Left;   CYSTOSCOPY/URETEROSCOPY/HOLMIUM LASER/STENT PLACEMENT Bilateral 05/24/2020   Procedure: CYSTOSCOPY/RETROGRADE/URETEROSCOPY/HOLMIUM LASER/STENT PLACEMENT;  Surgeon: Ceasar Mons, MD;  Location: [facility];  Service: Urology;   Laterality: Bilateral;   CYSTOSCOPY/URETEROSCOPY/HOLMIUM LASER/STENT PLACEMENT Right 09/27/2021   Procedure: CYSTOSCOPY/BILATERAL RETROGRADE/URETEROSCOPY/HOLMIUM LASER/STENT PLACEMENT;  Surgeon: Ceasar Mons, MD;  Location: [facility];  Service: Urology;  Laterality: Right;  ONLY NEEDS 60 MIN   EYE SURGERY Bilateral    cateracts   falls     variious fall, broken wrist,and toes   FRACTURE SURGERY Right April 2016   Wrist, Pt. fell   IR NEPHROSTOMY PLACEMENT RIGHT  04/19/2020   kidney stone removal Left 01/20/2019   RIGHT/LEFT HEART CATH AND CORONARY ANGIOGRAPHY N/A 02/05/2018   Procedure: RIGHT/LEFT HEART CATH AND CORONARY ANGIOGRAPHY;  Surgeon: Troy Sine, MD;  Location: [facility];  Service: Cardiovascular;  Laterality: N/A;   SPINAL CORD STIMULATOR INSERTION N/A 09/09/2015   Procedure: LUMBAR SPINAL CORD STIMULATOR INSERTION;  Surgeon: Clydell Hakim, MD;  Location: [facility];  Service: Neurosurgery;  Laterality: N/A;  LUMBAR SPINAL CORD STIMULATOR INSERTION   SPINE SURGERY  April 2013   Back X's 4   TOTAL HIP ARTHROPLASTY     right   WRIST FRACTURE SURGERY Bilateral    Family History  Problem Relation Age of Onset   Heart attack Mother    Restless legs syndrome Mother    Heart failure Mother    Heart disease Mother    Hypertension Mother    COPD Father    Social History   Socioeconomic History   Marital status: Married    Spouse name: [name]   Number of children: 2   Years of education: HS   Highest education level: Not on file  Occupational History    Comment: Homemaker  Tobacco Use   Smoking status: Never   Smokeless tobacco: Never  Vaping Use   Vaping Use: Never used  Substance and Sexual Activity   Alcohol use: No    Alcohol/week: 0.0 standard drinks of alcohol   Drug use: No   Sexual activity: Never  Other Topics Concern   Not on file  Social History Narrative   [facility] with [facility]. Married in 1957. Two  people lives in the home, no pets.  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    Caffeine Use: tea   Social Determinants of Health   Financial Resource Strain: Low Risk  (10/27/2018)   Overall Financial Resource Strain (CARDIA)    Difficulty of Paying Living Expenses: Not hard at all  Food Insecurity: No Food Insecurity (10/27/2018)   Hunger Vital Sign    Worried About Running Out of Food in the Last Year: Never true    Ran Out of Food in the Last Year: Never true  Transportation Needs: No Transportation Needs (10/27/2018)   PRAPARE - Hydrologist (Medical): No    Lack of Transportation (Non-Medical): No  Physical Activity: Insufficiently Active (10/27/2018)   Exercise Vital Sign    Days of Exercise per Week: 2 days    Minutes of Exercise per Session: 30 min  Stress: No Stress Concern Present (10/27/2018)   [place]    Feeling of Stress : Only a little  Social Connections: Socially Integrated (10/27/2018)   Social Connection and Isolation Panel [NHANES]    Frequency of Communication with Friends and Family: More than three times a week    Frequency of Social Gatherings with Friends and Family: More than three times a week    Attends Religious Services: More than 4 times per year    Active Member of [employer] or Organizations: Yes    Attends Music therapist: More than 4 times per year    Marital Status: Married    Tobacco Counseling Counseling given: Not Answered   Clinical Intake:  Pre-visit preparation completed: Yes  Pain : 0-10 Pain Score: 5  Pain Location: Back Pain Orientation: Right, Left, Mid Pain Descriptors / Indicators: Aching, Dull, Sore, Discomfort Pain Onset: Other (comment) Pain Frequency: Several days a week Pain Relieving Factors: Tylenol and using wheelchair Effect of Pain on Daily Activities:  unable to ambulate  Pain Relieving Factors: Tylenol and using wheelchair  BMI - recorded: 26.14 Nutritional Status: BMI 25 -29 Overweight Nutritional Risks: Unintentional weight loss Diabetes: No  How often do you need to have someone help you when you read instructions, pamphlets, or other written materials from your doctor or pharmacy?: 3 - Sometimes What is the last grade level you completed in school?: college  Diabetic?no  Interpreter Needed?: No  Information entered by :: Mazelle Huebert Bretta Bang NP   Activities of Daily Living    11/27/2022    2:15 PM  In your present state of health, do you have any difficulty performing the following activities:  Hearing? 0  Vision? 0  Difficulty concentrating or making decisions? 1  Walking or climbing stairs? 1  Doing errands, shopping? 1  Preparing Food and eating ? N  Using the Toilet? N  In the past six months, have you accidently leaked urine? Y  Do you have problems with loss of bowel control? Y  Managing your Medications? Y  Managing your Finances? Y  Housekeeping or managing your Housekeeping? Y    Patient Care Team: Kenji Mapel X, NP as PCP - General (Internal Medicine) [name], MD as PCP - Cardiology (Cardiology) [name], MD (Neurosurgery) [name], MD (Neurology) [name], MD (Inactive) as Consulting Physician (Gastroenterology) [name], MD as Consulting Physician (Urology) Loriel Diehl X, NP as Nurse Practitioner (Internal Medicine) Tat, Eustace Quail, DO as Consulting Physician (Neurology)  Indicate any recent Medical  Services you may have received from other than [facility] providers in the past year (date may be approximate).     Assessment:   This is a routine wellness examination for [facility].  Hearing/Vision screen No results found.  Dietary issues and exercise activities discussed: Current Exercise Habits: The patient does not participate in regular exercise at present, Exercise  limited by: cardiac condition(s);neurologic condition(s);orthopedic condition(s)   Goals Addressed             This Visit's Progress    Cognitive Function Enhanced       Evidence-based guidance:  Assess changes in cognitive function using standardized assessment when available.  Encourage and refer for services that stimulate thinking, problem-solving and memory, such as cognitive training and cognitive rehabilitation.  Encourage physical activity or exercise based on ability and tolerance.  Encourage enjoyable activities that provide general stimulation for thinking, concentration and memory in a social setting or small group.   Notes:        Depression Screen    09/28/2022   11:43 AM 08/09/2021   10:17 AM 08/03/2021    9:27 AM 10/27/2018   11:42 AM 10/09/2018    3:31 PM 07/08/2018    9:07 AM  PHQ 2/9 Scores  PHQ - 2 Score 0 0 0 1 0 2  PHQ- 9 Score      13    Fall Risk    11/27/2022   10:40 AM 09/28/2022   11:43 AM 09/25/2022    2:40 PM 03/13/2022    2:41 PM 10/10/2021   11:15 AM  [place] in the past year? 0 0 0 0 1  Number falls in past yr: 0 0  0 1  Injury with Fall? 0 0  0 0  Risk for fall due to : History of fall(s) History of fall(s)     Follow up Falls evaluation completed Falls evaluation completed       [place]:  Any stairs in or around the home? Yes  If so, are there any without handrails? No  Home free of loose throw rugs in walkways, pet beds, electrical cords, etc? Yes  Adequate lighting in your home to reduce risk of falls? Yes   ASSISTIVE DEVICES UTILIZED TO PREVENT FALLS:  Life alert? No  Use of a cane, walker or w/c? Yes  Grab bars in the bathroom? Yes  Shower chair or bench in shower? Yes  Elevated toilet seat or a handicapped toilet? Yes   TIMED UP AND GO:  Was the test performed? No . Uses wheelchair for mobility  Cognitive Function:    07/21/2018    3:48 PM 04/21/2018    8:11 AM  04/08/2018   10:21 AM 10/13/2015    2:57 PM  MMSE - Mini Mental State Exam  Orientation to time '3 3 4 5  '$ Orientation to Place '5 5 5 4  '$ Registration '3 3 3 3  '$ Attention/ Calculation '5 5 5 4  '$ Recall '2 2 1 2  '$ Language- name 2 objects '2 2 2 2  '$ Language- repeat '1 1 1 '$ 0  Language- follow 3 step command '3 3 3 3  '$ Language- read & follow direction '1 1 1 1  '$ Write a sentence '1 1 1 1  '$ Copy design 0 0 1 0  Total score '26 26 27 25        '$ Immunizations Immunization History  Administered Date(s) Administered   Hepatitis A, Adult 03/01/1997, 09/20/1997  Influenza Split 09/09/2013, 09/27/2014   Influenza, High Dose Seasonal PF 08/01/2011, 08/27/2012, 10/14/2013, 09/08/2015, 09/10/2016, 09/12/2017, 09/12/2018, 09/15/2019   Influenza,inj,Quad PF,6+ Mos 09/11/2016   Influenza-Unspecified 09/21/2020, 09/28/2021, 10/03/2022   Moderna SARS-COV2 Booster Vaccination 04/27/2022, 10/11/2022   Moderna Sars-Covid-2 Vaccination 12/12/2019, 01/09/2020, 10/18/2020, 05/09/2021, 08/29/2021   Pneumococcal Conjugate-13 04/15/2014   Pneumococcal Polysaccharide-23 09/28/2005   Td 06/20/2016   Tdap 04/29/2006, 09/01/2015   Zoster Recombinat (Shingrix) 06/11/2018, 09/10/2018   Zoster, Live 04/29/2006    TDAP status: Up to date  Flu Vaccine status: Up to date  Pneumococcal vaccine status: Up to date  Covid-19 vaccine status: Completed vaccines  Qualifies for Shingles Vaccine? Yes   Zostavax completed Yes   Shingrix Completed?: Yes  Screening Tests Health Maintenance  Topic Date Due   COVID-19 Vaccine (6 - 2023-24 season) 12/06/2022   Medicare Annual Wellness (AWV)  11/28/2023   DTaP/Tdap/Td (4 - Td or Tdap) 06/20/2026   Pneumonia Vaccine 47+ Years old  Completed   INFLUENZA VACCINE  Completed   DEXA SCAN  Completed   Zoster Vaccines- Shingrix  Completed   HPV VACCINES  Aged Out    Health Maintenance  There are no preventive care reminders to display for this patient.   Colorectal cancer  screening: No longer required.   Mammogram status: No longer required due to age.  Bone Density status: Ordered 11/27/22. Pt provided with contact info and advised to call to schedule appt.  Lung Cancer Screening: (Low Dose CT Chest recommended if Age 29-80 years, 30 pack-year currently smoking OR have quit w/in 15years.) does not qualify.   Lung Cancer Screening Referral:   Additional Screening:  Hepatitis C Screening: does not qualify; Completed   Vision Screening: Recommended annual ophthalmology exams for early detection of glaucoma and other disorders of the eye. Is the patient up to date with their annual eye exam?  Yes  Who is the provider or what is the name of the office in which the patient attends annual eye exams? HPOA will provide If pt is not established with a provider, would they like to be referred to a provider to establish care? No .   Dental Screening: Recommended annual dental exams for proper oral hygiene  Community Resource Referral / Chronic Care Management: CRR required this visit?  No   CCM required this visit?  No      Plan:    Obtain MMSE DEXA Eye Exam if HPOA desires.  I have personally reviewed and noted the following in the patient's chart:   Medical and social history Use of alcohol, tobacco or illicit drugs  Current medications and supplements including opioid prescriptions. Patient is not currently taking opioid prescriptions. Functional ability and status Nutritional status Physical activity Advanced directives List of other physicians Hospitalizations, surgeries, and ER visits in previous 12 months Vitals Screenings to include cognitive, depression, and falls Referrals and appointments  In addition, I have reviewed and discussed with patient certain preventive protocols, quality metrics, and best practice recommendations. A written personalized care plan for preventive services as well as general preventive health recommendations were  provided to patient.     Gale Klar X Denham Mose, NP   11/27/2022

## 2022-11-30 DIAGNOSIS — R441 Visual hallucinations: Secondary | ICD-10-CM | POA: Diagnosis not present

## 2022-11-30 DIAGNOSIS — F411 Generalized anxiety disorder: Secondary | ICD-10-CM | POA: Diagnosis not present

## 2022-11-30 DIAGNOSIS — F323 Major depressive disorder, single episode, severe with psychotic features: Secondary | ICD-10-CM | POA: Diagnosis not present

## 2022-12-04 ENCOUNTER — Non-Acute Institutional Stay (SKILLED_NURSING_FACILITY): Payer: Medicare Other | Admitting: Family Medicine

## 2022-12-04 DIAGNOSIS — R413 Other amnesia: Secondary | ICD-10-CM | POA: Diagnosis not present

## 2022-12-04 DIAGNOSIS — I5032 Chronic diastolic (congestive) heart failure: Secondary | ICD-10-CM

## 2022-12-04 DIAGNOSIS — K5909 Other constipation: Secondary | ICD-10-CM

## 2022-12-04 DIAGNOSIS — D508 Other iron deficiency anemias: Secondary | ICD-10-CM | POA: Diagnosis not present

## 2022-12-04 DIAGNOSIS — R011 Cardiac murmur, unspecified: Secondary | ICD-10-CM | POA: Diagnosis not present

## 2022-12-04 DIAGNOSIS — G20A1 Parkinson's disease without dyskinesia, without mention of fluctuations: Secondary | ICD-10-CM

## 2022-12-04 DIAGNOSIS — F323 Major depressive disorder, single episode, severe with psychotic features: Secondary | ICD-10-CM

## 2022-12-04 DIAGNOSIS — R441 Visual hallucinations: Secondary | ICD-10-CM | POA: Diagnosis not present

## 2022-12-04 NOTE — Progress Notes (Signed)
Provider:  Alain Honey, MD Location:      Place of Service:     PCP: [name], Man X, NP Patient Care Team: [name], Man X, NP as PCP - General (Internal Medicine) [name], MD as PCP - Cardiology (Cardiology) [name], MD (Neurosurgery) [name], MD (Neurology) [name], MD (Inactive) as Consulting Physician (Gastroenterology) [name], MD as Consulting Physician (Urology) [name], Man X, NP as Nurse Practitioner (Internal Medicine) Tat, Eustace Quail, DO as Consulting Physician (Neurology)  Extended Emergency Contact Information Primary Emergency Contact: [facility] Address: [address]          [place], [place] [address] [name] of [place] Phone: [phone] Mobile Phone: [phone] Relation: Spouse Secondary Emergency Contact: [name] Mobile Phone: [phone] Relation: Daughter Preferred language: [name] Interpreter needed? No  Code Status:  Goals of Care: Advanced Directive information    11/27/2022   10:39 AM  Advanced Directives  Does Patient Have a Medical Advance Directive? Yes  Type of Advance Directive Living will;Out of facility DNR (pink MOST or yellow form)  Does patient want to make changes to medical advance directive? No - Patient declined  Pre-existing out of facility DNR order (yellow form or pink MOST form) Yellow form placed in chart (order not valid for inpatient use)      No chief complaint on file.   HPI: Patient is a 88 y.o. female seen today for medical management of chronic problems including Parkinson's with depression and hallucinations, anemia (iron deficiency), aortic stenosis and congestive heart failure, memory loss. I found her in her room this morning.  Most of the other residents were eating.  She seems more confused than when I last saw her.  She has been seen recently for depression and hallucinations and takes sertraline as well as Seroquel for that.  Symptoms may be  somewhat improved.  Tramadol was also discontinued. She denies any shortness of breath or chest pain.  There are no syncopal episodes. She has been seen by neurology.  She continues on Sinemet for Parkinson's.  No tremor was noted today.  There is no dysphagia reported.  She is however constipated and has a history of prolapsed rectum.  She tried to tell me about that this morning but seems confused about a problem, calling it a sore on her bottom.  But she tells me she is not  Past Medical History:  Diagnosis Date   Abnormal nuclear stress test    Actinic cheilitis 04/23/2019   Anemia    iron deficiency    Anxiety    Aortic stenosis    severe by echo 2021    Arthritis    Broken arm    right    Chronic diastolic CHF (congestive heart failure) ([place])    Chronic pain    Cognitive communication deficit    Cognitive deficits    Communication   Constipation    Depression    Erosive (osteo)arthritis 03/09/2020   Family history of adverse reaction to anesthesia    pt. states sister vomits   Fibromyalgia    GERD (gastroesophageal reflux disease)    H/O hiatal hernia    Hallucinations, visual 07/21/2018   03/10/20 psych consult.    Hematuria 05/18/2020   History of bronchitis    History of kidney stones    Hypercholesterolemia    Hypertension    dr t turner   Hypothyroidism    Memory loss 04/21/2018   08/08/18 CT head no traumatic findings,  atrophy.  09/27/19 MMSE 25/30, failed the clock drawing   Microhematuria 03/24/2015   Mild CAD    Neuropathy    Osteoporosis    Parkinson disease    PVD (peripheral vascular disease) (HCC)    99% stenosis of left anteiror tibial artery, mod stenosis of left distal SFA and popliteal artery followed by [name]   RBBB    noted on EKG 2018   Restless leg syndrome    Sepsis ([place])    hx of due to Ecoli   Septic shock ([place]) 04/19/2020   Shortness of breath    with exertion - chronic    Sjogren's disease ([place])    Sjogren's syndrome ([place])  02/04/2018   04/16/19 rheumatology: Erosive OA of hands, Sjogrens syndrome, low back pain at multiple sites, recurrent kidney stones, age related osteoporosis w/o current pathological fracture, f/u 6 months.    SOB (shortness of breath)    chronic due to diastolic dysfunction, deconditioning, obesity   Spinal stenosis    lumbar region    Tremor    Unstable gait 04/21/2018   Unsteadiness on feet    Urinary frequency 09/18/2018   Urinary tract infection    Visual hallucinations    Past Surgical History:  Procedure Laterality Date   ABDOMINAL HYSTERECTOMY     BACK SURGERY     4 back surgeries,   lumbar fusion   CARDIAC CATHETERIZATION  2006   normal   CYSTOSCOPY/URETEROSCOPY/HOLMIUM LASER/STENT PLACEMENT Left 01/06/2019   Procedure: CYSTOSCOPY/RETROGRADE/URETEROSCOPY/HOLMIUM LASER/BASKET RETRIEVAL/STENT PLACEMENT;  Surgeon: Ceasar Mons, MD;  Location: [facility];  Service: Urology;  Laterality: Left;   CYSTOSCOPY/URETEROSCOPY/HOLMIUM LASER/STENT PLACEMENT Bilateral 05/24/2020   Procedure: CYSTOSCOPY/RETROGRADE/URETEROSCOPY/HOLMIUM LASER/STENT PLACEMENT;  Surgeon: Ceasar Mons, MD;  Location: [facility];  Service: Urology;  Laterality: Bilateral;   CYSTOSCOPY/URETEROSCOPY/HOLMIUM LASER/STENT PLACEMENT Right 09/27/2021   Procedure: CYSTOSCOPY/BILATERAL RETROGRADE/URETEROSCOPY/HOLMIUM LASER/STENT PLACEMENT;  Surgeon: Ceasar Mons, MD;  Location: [facility];  Service: Urology;  Laterality: Right;  ONLY NEEDS 60 MIN   EYE SURGERY Bilateral    cateracts   falls     variious fall, broken wrist,and toes   FRACTURE SURGERY Right April 2016   Wrist, Pt. fell   IR NEPHROSTOMY PLACEMENT RIGHT  04/19/2020   kidney stone removal Left 01/20/2019   RIGHT/LEFT HEART CATH AND CORONARY ANGIOGRAPHY N/A 02/05/2018   Procedure: RIGHT/LEFT HEART CATH AND CORONARY ANGIOGRAPHY;  Surgeon: Troy Sine, MD;  Location: [facility];  Service: Cardiovascular;  Laterality: N/A;    SPINAL CORD STIMULATOR INSERTION N/A 09/09/2015   Procedure: LUMBAR SPINAL CORD STIMULATOR INSERTION;  Surgeon: Clydell Hakim, MD;  Location: [facility];  Service: Neurosurgery;  Laterality: N/A;  LUMBAR SPINAL CORD STIMULATOR INSERTION   SPINE SURGERY  April 2013   Back X's 4   TOTAL HIP ARTHROPLASTY     right   WRIST FRACTURE SURGERY Bilateral     reports that she has never smoked. She has never used smokeless tobacco. She reports that she does not drink alcohol and does not use drugs. Social History   Socioeconomic History   Marital status: Married    Spouse name: [name]   Number of children: 2   Years of education: HS   Highest education level: Not on file  Occupational History    Comment: Homemaker  Tobacco Use   Smoking status: Never   Smokeless tobacco: Never  Vaping Use   Vaping Use: Never used  Substance and Sexual Activity   Alcohol use: No  Alcohol/week: 0.0 standard drinks of alcohol   Drug use: No   Sexual activity: Never  Other Topics Concern   Not on file  Social History Narrative   Patient lives at [facility] home with her spouse. Married in 1957. Two people lives in the home, no pets.    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    Caffeine Use: tea   Social Determinants of Health   Financial Resource Strain: Low Risk  (10/27/2018)   Overall Financial Resource Strain (CARDIA)    Difficulty of Paying Living Expenses: Not hard at all  Food Insecurity: No Food Insecurity (10/27/2018)   Hunger Vital Sign    Worried About Running Out of Food in the Last Year: Never true    Ran Out of Food in the Last Year: Never true  Transportation Needs: No Transportation Needs (10/27/2018)   PRAPARE - Hydrologist (Medical): No    Lack of Transportation (Non-Medical): No  Physical Activity: Insufficiently Active (10/27/2018)   Exercise Vital Sign    Days of Exercise per Week: 2 days     Minutes of Exercise per Session: 30 min  Stress: No Stress Concern Present (10/27/2018)   [place]    Feeling of Stress : Only a little  Social Connections: Socially Integrated (10/27/2018)   Social Connection and Isolation Panel [NHANES]    Frequency of Communication with Friends and Family: More than three times a week    Frequency of Social Gatherings with Friends and Family: More than three times a week    Attends Religious Services: More than 4 times per year    Active Member of Clubs or Organizations: Yes    Attends Archivist Meetings: More than 4 times per year    Marital Status: Married  Human resources officer Violence: Not At Risk (10/27/2018)   Humiliation, Afraid, Rape, and Kick questionnaire    Fear of Current or Ex-Partner: No    Emotionally Abused: No    Physically Abused: No    Sexually Abused: No    Functional Status Survey:    Family History  Problem Relation Age of Onset   Heart attack Mother    Restless legs syndrome Mother    Heart failure Mother    Heart disease Mother    Hypertension Mother    COPD Father     Health Maintenance  Topic Date Due   COVID-19 Vaccine (6 - 2023-24 season) 12/06/2022   Medicare Annual Wellness (AWV)  11/28/2023   DTaP/Tdap/Td (4 - Td or Tdap) 06/20/2026   Pneumonia Vaccine 78+ Years old  Completed   INFLUENZA VACCINE  Completed   DEXA SCAN  Completed   Zoster Vaccines- Shingrix  Completed   HPV VACCINES  Aged Out    Allergies  Allergen Reactions   Phosphate     Other reaction(s): Unknown   Adrenalone    Codeine Other (See Comments)    Reaction:  Headaches and nightmares  Other reaction(s): Unknown   Lactose Other (See Comments)    abd pain, lactose intolerant   Lactose Intolerance (Gi) Nausea And Vomiting   Latex Rash   Lyrica [Pregabalin] Swelling and Other (See Comments)    Reaction:  Leg swelling   Morphine Other (See Comments)    Other Other (See Comments)    Pt states that pain medications give her nightmares.     Plaquenil [  Hydroxychloroquine Sulfate] Other (See Comments)    Reaction:  GI upset    Reglan [Metoclopramide] Other (See Comments)    Reaction:  GI upset    Requip [Ropinirole Hcl] Other (See Comments)    Reaction:  GI upset    Septra [Sulfamethoxazole-Trimethoprim] Nausea And Vomiting   Shellfish Allergy Nausea And Vomiting   Sulfa Antibiotics Other (See Comments)    Headache, very sick    Outpatient Encounter Medications as of 12/04/2022  Medication Sig   acetaminophen (TYLENOL) 500 MG tablet Take 500 mg by mouth See admin instructions. Take 1 tablet (500 mg) by mouth scheduled 3 times daily scheduled & 3 times daily as needed for pain (Not to exceed 3g/24 hrs)   aspirin 81 MG chewable tablet Chew 81 mg by mouth daily.   Calcium Carb-Cholecalciferol (CALCIUM 600/VITAMIN D3) 600-800 MG-UNIT TABS Take 1 tablet by mouth daily.   carbidopa-levodopa (SINEMET IR) 25-100 MG tablet Take 1 tablet by mouth 3 (three) times daily.   Carbidopa-Levodopa ER (SINEMET CR) 25-100 MG tablet controlled release Take 1 tablet by mouth daily.   Cholecalciferol (VITAMIN D) 50 MCG (2000 UT) tablet Take 2,000 Units by mouth daily.   docusate sodium (COLACE) 100 MG capsule Take 100 mg by mouth at bedtime.   famotidine (PEPCID) 20 MG tablet Take 20 mg by mouth daily.   Ferrous Sulfate (SLOW FE) 142 (45 Fe) MG TBCR Take 142 mg by mouth in the morning.   furosemide (LASIX) 20 MG tablet Take 1 tablet (20 mg total) by mouth daily.   hydroxychloroquine (PLAQUENIL) 200 MG tablet Take 200 mg by mouth daily.   lactose free nutrition (BOOST) LIQD Take by mouth 2 (two) times daily. 1/2 box; Resident likes chocolate. Twice A Day   levothyroxine (SYNTHROID, LEVOTHROID) 50 MCG tablet Take 50 mcg by mouth daily before breakfast.    loperamide (IMODIUM A-D) 2 MG tablet Take 2 mg by mouth every 6 (six) hours as needed for diarrhea or loose  stools.   lubiprostone (AMITIZA) 24 MCG capsule Take 24 mcg by mouth 2 (two) times daily with a meal.    midodrine (PROAMATINE) 5 MG tablet Take 1 tablet (5 mg total) by mouth 3 (three) times daily with meals.   Multiple Vitamin (MULTIVITAMIN WITH MINERALS) TABS tablet Take 1 tablet by mouth daily.   Multiple Vitamins-Minerals (PRESERVISION AREDS 2 PO) Take 1 tablet by mouth daily.   nystatin powder Apply 1 Application topically daily. ABDOMINAL SKIN FOLD, UNDER BREAST AND GROIN   pantoprazole (PROTONIX) 40 MG tablet Take 40 mg by mouth daily.   Polyethyl Glycol-Propyl Glycol (SYSTANE) 0.4-0.3 % SOLN Apply 1 drop to eye 2 (two) times daily as needed (dry eyes).    polyethylene glycol (MIRALAX / GLYCOLAX) 17 g packet Take 17 g by mouth daily.   potassium citrate (UROCIT-K) 10 MEQ (1080 MG) SR tablet Take 10 mEq by mouth 2 (two) times daily.   pramipexole (MIRAPEX) 0.125 MG tablet Take 0.125 mg by mouth at bedtime.   QUEtiapine (SEROQUEL) 25 MG tablet Take 1 tablet (25 mg total) by mouth 2 (two) times daily.   rosuvastatin (CRESTOR) 10 MG tablet Take 10 mg by mouth daily.   senna-docusate (SENOKOT-S) 8.6-50 MG tablet Take 1 tablet by mouth at bedtime.    sertraline (ZOLOFT) 50 MG tablet Take 100 mg by mouth at bedtime.   Skin Protectants, Misc. (EUCERIN) cream Apply 1 application topically daily.   No facility-administered encounter medications on file as of 12/04/2022.  Review of Systems  Unable to perform ROS: Dementia    There were no vitals filed for this visit. There is no height or weight on file to calculate BMI. Physical Exam Vitals and nursing note reviewed.  Constitutional:      Appearance: Normal appearance.  HENT:     Mouth/Throat:     Mouth: Mucous membranes are moist.     Pharynx: Oropharynx is clear.  Eyes:     Extraocular Movements: Extraocular movements intact.     Pupils: Pupils are equal, round, and reactive to light.  Cardiovascular:     Rate and Rhythm:  Normal rate and regular rhythm.     Heart sounds: Murmur heard.  Pulmonary:     Effort: Pulmonary effort is normal.     Breath sounds: Normal breath sounds.  Abdominal:     General: Bowel sounds are normal.     Palpations: Abdomen is soft.  Musculoskeletal:     Comments: Gets around by wheelchair.  I have never seen her using a walker although she states she is able to do that  Neurological:     General: No focal deficit present.     Mental Status: She is alert.     Comments: Patient oriented to self but not time or place No tremor noted but there is a soft voice and general lack of expression     Labs reviewed: Basic Metabolic Panel: Recent Labs    01/11/22 0000 03/31/22 0000 09/25/22 0000  NA 141 140 143  K 4.4 4.6 4.4  CL 106 108 107  CO2 24* 23* 28*  BUN '20 20 17  '$ CREATININE 1.0 1.0 1.0  CALCIUM 9.5 9.4 10.1   Liver Function Tests: Recent Labs    01/11/22 0000 03/31/22 0000 09/25/22 0000  AST  --  18 24  ALT  --  12 14  ALKPHOS  --  76 76  ALBUMIN 3.8 3.6 4.2   No results for input(s): "LIPASE", "AMYLASE" in the last 8760 hours. No results for input(s): "AMMONIA" in the last 8760 hours. CBC: Recent Labs    03/31/22 0000 09/25/22 0000  WBC 8.8 6.7  NEUTROABS 5,342.00  --   HGB 9.6* 11.4*  HCT 30* 35*  PLT 248 288   Cardiac Enzymes: No results for input(s): "CKTOTAL", "CKMB", "CKMBINDEX", "TROPONINI" in the last 8760 hours. BNP: Invalid input(s): "POCBNP" No results found for: "HGBA1C" Lab Results  Component Value Date   TSH 2.21 09/25/2022   No results found for: "VITAMINB12" No results found for: "FOLATE" No results found for: "IRON", "TIBC", "FERRITIN"  Imaging and Procedures obtained prior to SNF admission: DG C-Arm 1-60 Min  Result Date: 09/27/2021 CLINICAL DATA:  Cystoscopy with right ureteroscopy and laser lithotripsy. Right ureteral stent placement. Right ureteral dilatation. Bilateral retrograde pyelogram. EXAM: DG C-ARM 1-60 MIN  CONTRAST:  Not provided FLUOROSCOPY TIME:  Fluoroscopy Time:  1 minute 9 seconds Radiation Exposure Index (if provided by the fluoroscopic device): 14.4 mGy Number of Acquired Spot Images: 6 COMPARISON:  CT urogram 09/08/2021 FINDINGS: Submitted intraoperative fluoroscopic images demonstrate balloon dilatation the distal right ureter. Right ureteral stent is seen. There is cannulation of the distal left ureter with retrograde opacification of the renal collecting system and ureter. IMPRESSION: Intraoperative images of left retrograde pyelogram and right ureteral dilation and stent placement as above. Electronically Signed   By: Miachel Roux M.D.   On: 09/27/2021 14:26    Assessment/Plan 1. Chronic heart failure with preserved ejection fraction (HCC)  Continues to be well compensated  2. Chronic constipation Tums managed with Amitiza  3. Depression, psychotic ([place]) Continues with sertraline  4. Hallucinations, visual Continue with Seroquel 25 mg twice daily  5. Heart murmur Unchanged.  No symptoms of CHF  6. Iron deficiency anemia secondary to inadequate dietary iron intake Hemoglobin has increased from 9.6 to 11.4  7. Memory loss Likely combination of Parkinson's and some vascular dementia  8. Parkinson's disease without fluctuating manifestations, unspecified whether dyskinesia present Continue with neurology.  Sinemet is effective.    Family/ staff Communication:   Labs/tests ordered: Lillette Boxer. Sabra Heck, [place] [address] [place], [place] Office [phone]

## 2022-12-11 ENCOUNTER — Encounter: Payer: Self-pay | Admitting: Nurse Practitioner

## 2022-12-11 ENCOUNTER — Non-Acute Institutional Stay (SKILLED_NURSING_FACILITY): Payer: Medicare Other | Admitting: Nurse Practitioner

## 2022-12-11 DIAGNOSIS — N390 Urinary tract infection, site not specified: Secondary | ICD-10-CM

## 2022-12-11 DIAGNOSIS — I350 Nonrheumatic aortic (valve) stenosis: Secondary | ICD-10-CM

## 2022-12-11 DIAGNOSIS — R413 Other amnesia: Secondary | ICD-10-CM

## 2022-12-11 DIAGNOSIS — E039 Hypothyroidism, unspecified: Secondary | ICD-10-CM

## 2022-12-11 DIAGNOSIS — K219 Gastro-esophageal reflux disease without esophagitis: Secondary | ICD-10-CM

## 2022-12-11 DIAGNOSIS — N200 Calculus of kidney: Secondary | ICD-10-CM | POA: Diagnosis not present

## 2022-12-11 DIAGNOSIS — E7800 Pure hypercholesterolemia, unspecified: Secondary | ICD-10-CM

## 2022-12-11 DIAGNOSIS — G2581 Restless legs syndrome: Secondary | ICD-10-CM

## 2022-12-11 DIAGNOSIS — F323 Major depressive disorder, single episode, severe with psychotic features: Secondary | ICD-10-CM

## 2022-12-11 DIAGNOSIS — I5032 Chronic diastolic (congestive) heart failure: Secondary | ICD-10-CM

## 2022-12-11 DIAGNOSIS — D509 Iron deficiency anemia, unspecified: Secondary | ICD-10-CM

## 2022-12-11 DIAGNOSIS — G20A1 Parkinson's disease without dyskinesia, without mention of fluctuations: Secondary | ICD-10-CM

## 2022-12-11 DIAGNOSIS — R300 Dysuria: Secondary | ICD-10-CM

## 2022-12-11 DIAGNOSIS — K5909 Other constipation: Secondary | ICD-10-CM

## 2022-12-11 DIAGNOSIS — M154 Erosive (osteo)arthritis: Secondary | ICD-10-CM

## 2022-12-11 NOTE — Progress Notes (Addendum)
Location:  [facility] Room Number: 45-A Place of Service:  SNF (31) Provider:  Morrison Masser X, NP   Rashun Grattan X, NP  Patient Care Team: Hobert Poplaski X, NP as PCP - General (Internal Medicine) [name], MD as PCP - Cardiology (Cardiology) [name], MD (Neurosurgery) [name], MD (Neurology) [name], MD (Inactive) as Consulting Physician (Gastroenterology) [name], MD as Consulting Physician (Urology) Linzie Boursiquot X, NP as Nurse Practitioner (Internal Medicine) Tat, Eustace Quail, DO as Consulting Physician (Neurology)  Extended Emergency Contact Information Primary Emergency Contact: [facility] Address: [address]          [place], [place] [address] [name] of [place] Phone: [phone] Mobile Phone: [phone] Relation: Spouse Secondary Emergency Contact: [name] Mobile Phone: [phone] Relation: Daughter Preferred language: [name] Interpreter needed? No  Code Status:  DNR Goals of care: Advanced Directive information    12/11/2022    2:14 PM  Advanced Directives  Does Patient Have a Medical Advance Directive? Yes  Type of Paramedic of [place];Living will;Out of facility DNR (pink MOST or yellow form)  Does patient want to make changes to medical advance directive? No - Patient declined  Copy of [place] in Chart? Yes - validated most recent copy scanned in chart (See row information)  Pre-existing out of facility DNR order (yellow form or pink MOST form) Yellow form placed in chart (order not valid for inpatient use)     Chief Complaint  Patient presents with   Acute Visit    Burning sensation upon urination    HPI:  Pt is a 89 y.o. female seen today for an acute visit for c/o burning sensation upon urination and increased urinary frequency. Denied lower abd pain, she is afebrile.    Memory loss, progressing, SNF [facility] for  supportive care.              Dry eyes, uses artificial tears.  Prolapsed rectum, avoid constipation, usually reduced on its own.              Hx of rena lithiasis, recurrent UTI, urosepsis, treated by ID. 09/19/21 cystoscopy, laser lithotripsy, R ureteral stent/dilatation. Occasional dysuria present.              GERD, takes Pantoprazole, Famotidine, yearly FOBT per GI, Hgb 11.4 09/25/22             Parkinson's, takes Sinemet, MiraPex f/u Dr. Carles Collet, w/c for mobility most of time.              RLS stable on MiraPex, Sinemet hs, better sleep at night.              Depression/hallucination, stabilizing,  takes Sertraline,  Quetiapine. Change Tramadol to prn helped. TSH 2.21 09/25/22             Constipation, takes Senokot S, Amitiza, Colace, MiraLax              Hypothyroidism, takes Levothyroxine TSH 2.21 09/25/22             Erosive arthritis, aches and pains,  takes Plaquenil, Tylenol  f/u Rheumatology.              CHF/trace edema BLE, takes Furosemide, cardiology, severe aortic valve stenosis. Echocardiogram 11/15/20 EF 60-65%. Bun/creat 16/1.0 11/27/22             Hyperlipidemia, LDL 64 01/11/22, on Rosuvastatin.  Anemia, takes Fe, Hgb 10.2 11/27/22             AS/CAD Cardiology palliative approach. On Midodrine per Cardiology. More noticeable DOE, fatigue.          Past Medical History:  Diagnosis Date   Abnormal nuclear stress test    Actinic cheilitis 04/23/2019   Anemia    iron deficiency    Anxiety    Aortic stenosis    severe by echo 2021    Arthritis    Broken arm    right    Chronic diastolic CHF (congestive heart failure) ([place])    Chronic pain    Cognitive communication deficit    Cognitive deficits    Communication   Constipation    Depression    Erosive (osteo)arthritis 03/09/2020   Family history of adverse reaction to anesthesia    pt. states sister vomits   Fibromyalgia    GERD (gastroesophageal reflux disease)    H/O hiatal hernia     Hallucinations, visual 07/21/2018   03/10/20 psych consult.    Hematuria 05/18/2020   History of bronchitis    History of kidney stones    Hypercholesterolemia    Hypertension    dr t turner   Hypothyroidism    Memory loss 04/21/2018   08/08/18 CT head no traumatic findings, atrophy.  09/27/19 MMSE 25/30, failed the clock drawing   Microhematuria 03/24/2015   Mild CAD    Neuropathy    Osteoporosis    Parkinson disease    PVD (peripheral vascular disease) (HCC)    99% stenosis of left anteiror tibial artery, mod stenosis of left distal SFA and popliteal artery followed by [name]   RBBB    noted on EKG 2018   Restless leg syndrome    Sepsis ([place])    hx of due to Ecoli   Septic shock ([place]) 04/19/2020   Shortness of breath    with exertion - chronic    Sjogren's disease ([place])    Sjogren's syndrome ([place]) 02/04/2018   04/16/19 rheumatology: Erosive OA of hands, Sjogrens syndrome, low back pain at multiple sites, recurrent kidney stones, age related osteoporosis w/o current pathological fracture, f/u 6 months.    SOB (shortness of breath)    chronic due to diastolic dysfunction, deconditioning, obesity   Spinal stenosis    lumbar region    Tremor    Unstable gait 04/21/2018   Unsteadiness on feet    Urinary frequency 09/18/2018   Urinary tract infection    Visual hallucinations    Past Surgical History:  Procedure Laterality Date   ABDOMINAL HYSTERECTOMY     BACK SURGERY     4 back surgeries,   lumbar fusion   CARDIAC CATHETERIZATION  2006   normal   CYSTOSCOPY/URETEROSCOPY/HOLMIUM LASER/STENT PLACEMENT Left 01/06/2019   Procedure: CYSTOSCOPY/RETROGRADE/URETEROSCOPY/HOLMIUM LASER/BASKET RETRIEVAL/STENT PLACEMENT;  Surgeon: Ceasar Mons, MD;  Location: [facility];  Service: Urology;  Laterality: Left;   CYSTOSCOPY/URETEROSCOPY/HOLMIUM LASER/STENT PLACEMENT Bilateral 05/24/2020   Procedure: CYSTOSCOPY/RETROGRADE/URETEROSCOPY/HOLMIUM LASER/STENT PLACEMENT;  Surgeon:  Ceasar Mons, MD;  Location: [facility];  Service: Urology;  Laterality: Bilateral;   CYSTOSCOPY/URETEROSCOPY/HOLMIUM LASER/STENT PLACEMENT Right 09/27/2021   Procedure: CYSTOSCOPY/BILATERAL RETROGRADE/URETEROSCOPY/HOLMIUM LASER/STENT PLACEMENT;  Surgeon: Ceasar Mons, MD;  Location: [facility];  Service: Urology;  Laterality: Right;  ONLY NEEDS 60 MIN   EYE SURGERY Bilateral    cateracts   falls     variious fall, broken wrist,and toes   FRACTURE SURGERY Right April 2016  Wrist, Pt. fell   IR NEPHROSTOMY PLACEMENT RIGHT  04/19/2020   kidney stone removal Left 01/20/2019   RIGHT/LEFT HEART CATH AND CORONARY ANGIOGRAPHY N/A 02/05/2018   Procedure: RIGHT/LEFT HEART CATH AND CORONARY ANGIOGRAPHY;  Surgeon: Troy Sine, MD;  Location: [facility];  Service: Cardiovascular;  Laterality: N/A;   SPINAL CORD STIMULATOR INSERTION N/A 09/09/2015   Procedure: LUMBAR SPINAL CORD STIMULATOR INSERTION;  Surgeon: Clydell Hakim, MD;  Location: [facility];  Service: Neurosurgery;  Laterality: N/A;  LUMBAR SPINAL CORD STIMULATOR INSERTION   SPINE SURGERY  April 2013   Back X's 4   TOTAL HIP ARTHROPLASTY     right   WRIST FRACTURE SURGERY Bilateral     Allergies  Allergen Reactions   Phosphate     Other reaction(s): Unknown   Adrenalone    Codeine Other (See Comments)    Reaction:  Headaches and nightmares  Other reaction(s): Unknown   Lactose Other (See Comments)    abd pain, lactose intolerant   Lactose Intolerance (Gi) Nausea And Vomiting   Latex Rash   Lyrica [Pregabalin] Swelling and Other (See Comments)    Reaction:  Leg swelling   Morphine Other (See Comments)   Other Other (See Comments)    Pt states that pain medications give her nightmares.     Plaquenil [Hydroxychloroquine Sulfate] Other (See Comments)    Reaction:  GI upset    Reglan [Metoclopramide] Other (See Comments)    Reaction:  GI upset    Requip [Ropinirole Hcl] Other (See Comments)     Reaction:  GI upset    Septra [Sulfamethoxazole-Trimethoprim] Nausea And Vomiting   Shellfish Allergy Nausea And Vomiting   Sulfa Antibiotics Other (See Comments)    Headache, very sick    Outpatient Encounter Medications as of 12/11/2022  Medication Sig   acetaminophen (TYLENOL) 500 MG tablet Take 500 mg by mouth See admin instructions. Take 1 tablet (500 mg) by mouth scheduled 3 times daily scheduled & 3 times daily as needed for pain (Not to exceed 3g/24 hrs)   aspirin 81 MG chewable tablet Chew 81 mg by mouth daily.   Calcium Carb-Cholecalciferol (CALCIUM 600/VITAMIN D3) 600-800 MG-UNIT TABS Take 1 tablet by mouth daily.   carbidopa-levodopa (SINEMET IR) 25-100 MG tablet Take 1 tablet by mouth 3 (three) times daily.   Carbidopa-Levodopa ER (SINEMET CR) 25-100 MG tablet controlled release Take 1 tablet by mouth daily.   Cholecalciferol (VITAMIN D) 50 MCG (2000 UT) tablet Take 2,000 Units by mouth daily.   docusate sodium (COLACE) 100 MG capsule Take 100 mg by mouth at bedtime.   famotidine (PEPCID) 20 MG tablet Take 20 mg by mouth daily.   Ferrous Sulfate (SLOW FE) 142 (45 Fe) MG TBCR Take 142 mg by mouth in the morning.   furosemide (LASIX) 20 MG tablet Take 1 tablet (20 mg total) by mouth daily.   hydroxychloroquine (PLAQUENIL) 200 MG tablet Take 200 mg by mouth daily.   lactose free nutrition (BOOST) LIQD Take by mouth 2 (two) times daily. 1/2 box; Resident likes chocolate. Twice A Day   levothyroxine (SYNTHROID, LEVOTHROID) 50 MCG tablet Take 50 mcg by mouth daily before breakfast.    loperamide (IMODIUM A-D) 2 MG tablet Take 2 mg by mouth every 6 (six) hours as needed for diarrhea or loose stools.   lubiprostone (AMITIZA) 24 MCG capsule Take 24 mcg by mouth 2 (two) times daily with a meal.    midodrine (PROAMATINE) 5 MG tablet Take  1 tablet (5 mg total) by mouth 3 (three) times daily with meals.   Multiple Vitamin (MULTIVITAMIN WITH MINERALS) TABS tablet Take 1 tablet by mouth daily.    Multiple Vitamins-Minerals (PRESERVISION AREDS 2 PO) Take 1 tablet by mouth daily.   nystatin powder Apply 1 Application topically daily. ABDOMINAL SKIN FOLD, UNDER BREAST AND GROIN   pantoprazole (PROTONIX) 40 MG tablet Take 40 mg by mouth daily.   Polyethyl Glycol-Propyl Glycol (SYSTANE) 0.4-0.3 % SOLN Apply 1 drop to eye 2 (two) times daily as needed (dry eyes).    polyethylene glycol (MIRALAX / GLYCOLAX) 17 g packet Take 17 g by mouth daily.   potassium citrate (UROCIT-K) 10 MEQ (1080 MG) SR tablet Take 10 mEq by mouth 2 (two) times daily.   pramipexole (MIRAPEX) 0.125 MG tablet Take 0.125 mg by mouth at bedtime.   QUEtiapine (SEROQUEL) 25 MG tablet Take 1 tablet (25 mg total) by mouth 2 (two) times daily.   rosuvastatin (CRESTOR) 10 MG tablet Take 10 mg by mouth daily.   senna-docusate (SENOKOT-S) 8.6-50 MG tablet Take 1 tablet by mouth at bedtime.    sertraline (ZOLOFT) 50 MG tablet Take 100 mg by mouth at bedtime.   Skin Protectants, Misc. (EUCERIN) cream Apply 1 application topically daily.   No facility-administered encounter medications on file as of 12/11/2022.    Review of Systems  Constitutional:  Positive for unexpected weight change. Negative for activity change, appetite change and fever.  HENT:  Positive for hearing loss. Negative for congestion and voice change.   Eyes:  Negative for visual disturbance.       Dry eyes.   Respiratory:  Positive for shortness of breath. Negative for cough.        Chronic DOE  Cardiovascular:  Positive for leg swelling.  Gastrointestinal:  Negative for abdominal pain and constipation.  Genitourinary:  Positive for dysuria and frequency. Negative for hematuria and urgency.       Urinary leakage  Musculoskeletal:  Positive for arthralgias, back pain, gait problem and myalgias.       Chronic lower back pain. The R 3rd finger mid knuckle pain is chronic.   Skin:  Negative for color change.  Neurological:  Positive for tremors. Negative for  speech difficulty, weakness and headaches.       Memory lapses. Minimal resting tremor in fingers, not disabling. RLS occasional symptomatic.   Psychiatric/Behavioral:  Positive for agitation, behavioral problems, dysphoric mood and hallucinations. Negative for sleep disturbance. The patient is nervous/anxious.        Improved paranoia    Immunization History  Administered Date(s) Administered   Hepatitis A, Adult 03/01/1997, 09/20/1997   Influenza Split 09/09/2013, 09/27/2014   Influenza, High Dose Seasonal PF 08/01/2011, 08/27/2012, 10/14/2013, 09/08/2015, 09/10/2016, 09/12/2017, 09/12/2018, 09/15/2019, 10/03/2022   Influenza,inj,Quad PF,6+ Mos 09/11/2016   Influenza-Unspecified 09/21/2020, 09/28/2021, 10/03/2022   Moderna SARS-COV2 Booster Vaccination 04/27/2022, 10/11/2022   Moderna Sars-Covid-2 Vaccination 12/12/2019, 01/09/2020, 10/18/2020, 05/09/2021, 08/29/2021   Pneumococcal Conjugate-13 04/15/2014   Pneumococcal Polysaccharide-23 09/28/2005   Td 06/20/2016   Tdap 04/29/2006, 09/01/2015   Zoster Recombinat (Shingrix) 06/11/2018, 09/10/2018   Zoster, Live 04/29/2006   Pertinent  Health Maintenance Due  Topic Date Due   INFLUENZA VACCINE  Completed   DEXA SCAN  Completed      10/10/2021   11:15 AM 03/13/2022    2:41 PM 09/25/2022    2:40 PM 09/28/2022   11:43 AM 11/27/2022   10:40 AM  Fall Risk  Falls in the  past year? 1 0 0 0 0  Was there an injury with Fall? 0 0  0 0  Fall Risk Category Calculator 2 0  0 0  Fall Risk Category Moderate Low  Low Low  Patient Fall Risk Level Moderate fall risk    Low fall risk  Patient at Risk for Falls Due to    History of fall(s) History of fall(s)  Fall risk Follow up    Falls evaluation completed Falls evaluation completed   Functional Status Survey:    Vitals:   12/11/22 1050  BP: 117/65  Pulse: 90  Resp: 17  Temp: (!) 97.4 F (36.3 C)  SpO2: 94%   There is no height or weight on file to calculate BMI. Physical  Exam Vitals and nursing note reviewed.  Constitutional:      Comments: Sad facial looks, gradual weight loss.   HENT:     Head: Normocephalic and atraumatic.     Nose: Nose normal.     Mouth/Throat:     Mouth: Mucous membranes are moist.  Eyes:     Extraocular Movements: Extraocular movements intact.     Conjunctiva/sclera: Conjunctivae normal.     Pupils: Pupils are equal, round, and reactive to light.  Cardiovascular:     Rate and Rhythm: Normal rate and regular rhythm.     Heart sounds: Murmur heard.  Pulmonary:     Effort: Pulmonary effort is normal.     Breath sounds: No rales.  Abdominal:     General: Bowel sounds are normal.     Palpations: Abdomen is soft.     Tenderness: There is no abdominal tenderness. There is no right CVA tenderness, left CVA tenderness, guarding or rebound.     Comments: Suprapubic region discomfort when palpated.   Genitourinary:    Comments: Hx of reported protruded rectum was reduced on its own, a external hemorrhoid at 7pm, no s/s of injury.  Musculoskeletal:        General: Tenderness present.     Cervical back: Normal range of motion and neck supple.     Right lower leg: Edema present.     Left lower leg: Edema present.     Comments: Trace edema BLE.  Arthritic changes in fingers, R>L. The R 3rd finger mid knuckle swelling, no heat or redness. Chronic lower back pain.   Skin:    General: Skin is warm and dry.  Neurological:     General: No focal deficit present.     Mental Status: She is alert. Mental status is at baseline.     Gait: Gait abnormal.     Comments: Oriented to person, place. Ambulates with walker.   Psychiatric:     Comments: Visual hallucination vs delusion is chronic, improved paranoia and anxiety.      Labs reviewed: Recent Labs    01/11/22 0000 03/31/22 0000 09/25/22 0000  NA 141 140 143  K 4.4 4.6 4.4  CL 106 108 107  CO2 24* 23* 28*  BUN '20 20 17  '$ CREATININE 1.0 1.0 1.0  CALCIUM 9.5 9.4 10.1   Recent  Labs    01/11/22 0000 03/31/22 0000 09/25/22 0000  AST  --  18 24  ALT  --  12 14  ALKPHOS  --  76 76  ALBUMIN 3.8 3.6 4.2   Recent Labs    03/31/22 0000 09/25/22 0000  WBC 8.8 6.7  NEUTROABS 5,342.00  --   HGB 9.6* 11.4*  HCT 30* 35*  PLT 248 288   Lab Results  Component Value Date   TSH 2.21 09/25/2022   No results found for: "HGBA1C" Lab Results  Component Value Date   CHOL 137 01/11/2022   HDL 47 01/11/2022   LDLCALC 64 01/11/2022   TRIG 184 (A) 01/11/2022   CHOLHDL 2.4 07/14/2019    Significant Diagnostic Results in last 30 days:  No results found.  Assessment/Plan Dysuria c/o burning sensation upon urination and increased urinary frequency. Denied lower abd pain, she is afebrile. Obtain cath UA C/S, observe.  12/13/22 wbc 6.5, Hgb 10.6, plt 259, neutrophils 54.9  Memory loss progressing, SNF [facility] for supportive care.   Renal lithiasis Hx of rena lithiasis, recurrent UTI, urosepsis, treated by ID in the past. 09/19/21 cystoscopy, laser lithotripsy, R ureteral stent/dilatation. Occasional dysuria present.   GERD (gastroesophageal reflux disease) Hx of rena lithiasis, recurrent UTI, urosepsis, treated by ID. 09/19/21 cystoscopy, laser lithotripsy, R ureteral stent/dilatation. Occasional dysuria present.   Parkinson's disease ([place]) takes Sinemet, MiraPex f/u Dr. Carles Collet, w/c for mobility most of time.   Restless leg syndrome    RLS stable on MiraPex, Sinemet hs, better sleep at night.   Depression, psychotic ([place]) stabilizing,  takes Sertraline,  Quetiapine. Change Tramadol to prn helped. TSH 2.21 09/25/22  Chronic constipation takes Senokot S, Amitiza, Colace, MiraLax, stable.   Hypothyroidism takes Levothyroxine TSH 2.21 09/25/22  Erosive (osteo)arthritis aches and pains,  takes Plaquenil, Tylenol  f/u Rheumatology.   (HFpEF) heart failure with preserved ejection fraction (HCC)   CHF/trace edema BLE, takes Furosemide, cardiology, severe aortic valve  stenosis. Echocardiogram 11/15/20 EF 60-65%. Bun/creat 16/1.0 11/27/22  Hypercholesterolemia  LDL 64 01/11/22, on Rosuvastatin.   Anemia takes Fe, Hgb 10.2 11/27/22  Severe aortic stenosis - not a candidate for TAVR Cardiology palliative approach. On Midodrine per Cardiology. More noticeable DOE, fatigue.   Recurrent UTI 12/13/22 urine culture Klebsiella >100,000c/ml, Augmentin 875/'125mg'$  bid x 7 days 12/16/22     Family/ staff Communication: plan of care reviewed with the patient and charge nurse.   Labs/tests ordered: UA C/S   Time spend 35 minutes.

## 2022-12-11 NOTE — Assessment & Plan Note (Addendum)
c/o burning sensation upon urination and increased urinary frequency. Denied lower abd pain, she is afebrile. Obtain cath UA C/S, observe.  12/13/22 wbc 6.5, Hgb 10.6, plt 259, neutrophils 54.9

## 2022-12-11 NOTE — Assessment & Plan Note (Signed)
progressing, SNF [facility] for supportive care.

## 2022-12-13 LAB — CBC AND DIFFERENTIAL
HCT: 32 — AB (ref 36–46)
Hemoglobin: 10.6 — AB (ref 12.0–16.0)
Neutrophils Absolute: 3569
Platelets: 259 10*3/uL (ref 150–400)
WBC: 6.5

## 2022-12-13 LAB — CBC: RBC: 3.33 — AB (ref 3.87–5.11)

## 2022-12-13 NOTE — Assessment & Plan Note (Signed)
stabilizing,  takes Sertraline,  Quetiapine. Change Tramadol to prn helped. TSH 2.21 09/25/22

## 2022-12-13 NOTE — Assessment & Plan Note (Signed)
takes Levothyroxine TSH 2.21 09/25/22 

## 2022-12-13 NOTE — Assessment & Plan Note (Signed)
LDL 64 01/11/22, on Rosuvastatin.  

## 2022-12-13 NOTE — Assessment & Plan Note (Signed)
takes Sinemet, MiraPex f/u Dr. Tat, w/c for mobility most of time.  

## 2022-12-13 NOTE — Assessment & Plan Note (Addendum)
CHF/trace edema BLE, takes Furosemide, cardiology, severe aortic valve stenosis. Echocardiogram 11/15/20 EF 60-65%. Bun/creat 16/1.0 11/27/22

## 2022-12-13 NOTE — Assessment & Plan Note (Signed)
takes Fe, Hgb 10.2 11/27/22

## 2022-12-13 NOTE — Assessment & Plan Note (Signed)
Cardiology palliative approach. On Midodrine per Cardiology. More noticeable DOE, fatigue.

## 2022-12-13 NOTE — Assessment & Plan Note (Signed)
Hx of rena lithiasis, recurrent UTI, urosepsis, treated by ID in the past. 09/19/21 cystoscopy, laser lithotripsy, R ureteral stent/dilatation. Occasional dysuria present.

## 2022-12-13 NOTE — Assessment & Plan Note (Signed)
takes Senokot S, Amitiza, Colace, MiraLax, stable.

## 2022-12-13 NOTE — Assessment & Plan Note (Signed)
RLS stable on MiraPex, Sinemet hs, better sleep at night.  

## 2022-12-13 NOTE — Assessment & Plan Note (Signed)
aches and pains,  takes Plaquenil, Tylenol  f/u Rheumatology.

## 2022-12-13 NOTE — Assessment & Plan Note (Signed)
Hx of rena lithiasis, recurrent UTI, urosepsis, treated by ID. 09/19/21 cystoscopy, laser lithotripsy, R ureteral stent/dilatation. Occasional dysuria present.

## 2022-12-17 NOTE — Assessment & Plan Note (Signed)
12/13/22 urine culture Klebsiella >100,000c/ml, Augmentin 875/'125mg'$  bid x 7 days 12/16/22

## 2022-12-24 ENCOUNTER — Non-Acute Institutional Stay (SKILLED_NURSING_FACILITY): Payer: Medicare Other | Admitting: Nurse Practitioner

## 2022-12-24 ENCOUNTER — Encounter: Payer: Self-pay | Admitting: Nurse Practitioner

## 2022-12-24 DIAGNOSIS — N390 Urinary tract infection, site not specified: Secondary | ICD-10-CM | POA: Diagnosis not present

## 2022-12-24 DIAGNOSIS — G2581 Restless legs syndrome: Secondary | ICD-10-CM

## 2022-12-24 DIAGNOSIS — N1831 Chronic kidney disease, stage 3a: Secondary | ICD-10-CM

## 2022-12-24 DIAGNOSIS — U071 COVID-19: Secondary | ICD-10-CM | POA: Diagnosis not present

## 2022-12-24 DIAGNOSIS — F323 Major depressive disorder, single episode, severe with psychotic features: Secondary | ICD-10-CM

## 2022-12-24 DIAGNOSIS — G20A1 Parkinson's disease without dyskinesia, without mention of fluctuations: Secondary | ICD-10-CM | POA: Diagnosis not present

## 2022-12-24 DIAGNOSIS — K219 Gastro-esophageal reflux disease without esophagitis: Secondary | ICD-10-CM | POA: Diagnosis not present

## 2022-12-24 DIAGNOSIS — I5032 Chronic diastolic (congestive) heart failure: Secondary | ICD-10-CM

## 2022-12-24 NOTE — Assessment & Plan Note (Signed)
RLS stable on MiraPex, Sinemet hs, better sleep at night.  

## 2022-12-24 NOTE — Assessment & Plan Note (Signed)
takes Sinemet, MiraPex f/u Dr. Tat, w/c for mobility most of time.  

## 2022-12-24 NOTE — Assessment & Plan Note (Signed)
CHF/trace edema BLE, takes Furosemide, cardiology, severe aortic valve stenosis. Echocardiogram 11/15/20 EF 60-65%. Bun/creat 16/1.0 12/19/2

## 2022-12-24 NOTE — Assessment & Plan Note (Signed)
Depression/hallucination, stabilizing,  takes Sertraline,  Quetiapine. Change Tramadol to prn helped. TSH 2.21 09/25/22 

## 2022-12-24 NOTE — Assessment & Plan Note (Signed)
hoarseness, fatigue, nasal congestion, tested positive for COVID. The patient is afebrile, denied sore throat, cough, or SOB, start Paxlovid 150/'100mg'$  bid x 5 days.

## 2022-12-24 NOTE — Assessment & Plan Note (Signed)
11/27/22 Bun 16, creat 1.0, eGFR 54

## 2022-12-24 NOTE — Progress Notes (Unsigned)
Location:   SNF [place] Room Number: 45-A Place of Service:  SNF (31) Provider: Lennie Odor Jere Bostrom NP  Sereena Marando X, NP  Patient Care Team: Ashad Fawbush X, NP as PCP - General (Internal Medicine) [name], MD as PCP - Cardiology (Cardiology) [name], MD (Neurosurgery) [name], MD (Neurology) [name], MD (Inactive) as Consulting Physician (Gastroenterology) [name], MD as Consulting Physician (Urology) Kniyah Khun X, NP as Nurse Practitioner (Internal Medicine) Tat, Eustace Quail, DO as Consulting Physician (Neurology)  Extended Emergency Contact Information Primary Emergency Contact: [facility] Address: [address]          [place], [place] [address] [name] of [place] Phone: [phone] Mobile Phone: [phone] Relation: Spouse Secondary Emergency Contact: [name] Mobile Phone: [phone] Relation: Daughter Preferred language: [name] Interpreter needed? No  Code Status: DNR Goals of care: Advanced Directive information    12/24/2022    4:12 PM  Advanced Directives  Does Patient Have a Medical Advance Directive? Yes  Type of Paramedic of [place];Living will;Out of facility DNR (pink MOST or yellow form)  Does patient want to make changes to medical advance directive? No - Patient declined  Copy of [place] in Chart? Yes - validated most recent copy scanned in chart (See row information)  Pre-existing out of facility DNR order (yellow form or pink MOST form) Yellow form placed in chart (order not valid for inpatient use)     Chief Complaint  Patient presents with   Acute Visit    COVID positive    HPI:  Pt is a 89 y.o. female seen today for an acute visit for hoarseness, fatigue, nasal congestion, tested positive for COVID. The patient is afebrile, denied sore throat, cough, or SOB Memory loss, progressing, SNF [facility] for supportive care.               Dry eyes, uses artificial tears.  Prolapsed rectum, avoid constipation, usually reduced on its own.              Hx of rena lithiasis, recurrent UTI, urosepsis, treated by ID. 09/19/21 cystoscopy, laser lithotripsy, R ureteral stent/dilatation. Occasional dysuria present.              GERD, takes Pantoprazole, Famotidine, yearly FOBT per GI, Hgb 11.4 09/25/22             Parkinson's, takes Sinemet, MiraPex f/u Dr. Carles Collet, w/c for mobility most of time.              RLS stable on MiraPex, Sinemet hs, better sleep at night.              Depression/hallucination, stabilizing,  takes Sertraline,  Quetiapine. Change Tramadol to prn helped. TSH 2.21 09/25/22             Constipation, takes Senokot S, Amitiza, Colace, MiraLax              Hypothyroidism, takes Levothyroxine TSH 2.21 09/25/22             Erosive arthritis, aches and pains,  takes Plaquenil, Tylenol  f/u Rheumatology.              CHF/trace edema BLE, takes Furosemide, cardiology, severe aortic valve stenosis. Echocardiogram 11/15/20 EF 60-65%. Bun/creat 16/1.0 11/27/22             Hyperlipidemia, LDL 64 01/11/22, on Rosuvastatin.  Anemia, takes Fe, Hgb 10.2 11/27/22             AS/CAD Cardiology palliative approach. On Midodrine per Cardiology. More noticeable DOE, fatigue.       Past Medical History:  Diagnosis Date   Abnormal nuclear stress test    Actinic cheilitis 04/23/2019   Anemia    iron deficiency    Anxiety    Aortic stenosis    severe by echo 2021    Arthritis    Broken arm    right    Chronic diastolic CHF (congestive heart failure) ([place])    Chronic pain    Cognitive communication deficit    Cognitive deficits    Communication   Constipation    Depression    Erosive (osteo)arthritis 03/09/2020   Family history of adverse reaction to anesthesia    pt. states sister vomits   Fibromyalgia    GERD (gastroesophageal reflux disease)    H/O hiatal hernia    Hallucinations, visual 07/21/2018   03/10/20  psych consult.    Hematuria 05/18/2020   History of bronchitis    History of kidney stones    Hypercholesterolemia    Hypertension    dr t turner   Hypothyroidism    Memory loss 04/21/2018   08/08/18 CT head no traumatic findings, atrophy.  09/27/19 MMSE 25/30, failed the clock drawing   Microhematuria 03/24/2015   Mild CAD    Neuropathy    Osteoporosis    Parkinson disease    PVD (peripheral vascular disease) (HCC)    99% stenosis of left anteiror tibial artery, mod stenosis of left distal SFA and popliteal artery followed by [name]   RBBB    noted on EKG 2018   Restless leg syndrome    Sepsis ([place])    hx of due to Ecoli   Septic shock ([place]) 04/19/2020   Shortness of breath    with exertion - chronic    Sjogren's disease ([place])    Sjogren's syndrome ([place]) 02/04/2018   04/16/19 rheumatology: Erosive OA of hands, Sjogrens syndrome, low back pain at multiple sites, recurrent kidney stones, age related osteoporosis w/o current pathological fracture, f/u 6 months.    SOB (shortness of breath)    chronic due to diastolic dysfunction, deconditioning, obesity   Spinal stenosis    lumbar region    Tremor    Unstable gait 04/21/2018   Unsteadiness on feet    Urinary frequency 09/18/2018   Urinary tract infection    Visual hallucinations    Past Surgical History:  Procedure Laterality Date   ABDOMINAL HYSTERECTOMY     BACK SURGERY     4 back surgeries,   lumbar fusion   CARDIAC CATHETERIZATION  2006   normal   CYSTOSCOPY/URETEROSCOPY/HOLMIUM LASER/STENT PLACEMENT Left 01/06/2019   Procedure: CYSTOSCOPY/RETROGRADE/URETEROSCOPY/HOLMIUM LASER/BASKET RETRIEVAL/STENT PLACEMENT;  Surgeon: Ceasar Mons, MD;  Location: [facility];  Service: Urology;  Laterality: Left;   CYSTOSCOPY/URETEROSCOPY/HOLMIUM LASER/STENT PLACEMENT Bilateral 05/24/2020   Procedure: CYSTOSCOPY/RETROGRADE/URETEROSCOPY/HOLMIUM LASER/STENT PLACEMENT;  Surgeon: Ceasar Mons, MD;  Location: [facility];  Service: Urology;  Laterality: Bilateral;   CYSTOSCOPY/URETEROSCOPY/HOLMIUM LASER/STENT PLACEMENT Right 09/27/2021   Procedure: CYSTOSCOPY/BILATERAL RETROGRADE/URETEROSCOPY/HOLMIUM LASER/STENT PLACEMENT;  Surgeon: Ceasar Mons, MD;  Location: [facility];  Service: Urology;  Laterality: Right;  ONLY NEEDS 60 MIN   EYE SURGERY Bilateral    cateracts   falls     variious fall, broken wrist,and toes   FRACTURE SURGERY Right April 2016   Wrist, Pt. fell  IR NEPHROSTOMY PLACEMENT RIGHT  04/19/2020   kidney stone removal Left 01/20/2019   RIGHT/LEFT HEART CATH AND CORONARY ANGIOGRAPHY N/A 02/05/2018   Procedure: RIGHT/LEFT HEART CATH AND CORONARY ANGIOGRAPHY;  Surgeon: Troy Sine, MD;  Location: [facility] [facility];  Service: Cardiovascular;  Laterality: N/A;   SPINAL CORD STIMULATOR INSERTION N/A 09/09/2015   Procedure: LUMBAR SPINAL CORD STIMULATOR INSERTION;  Surgeon: Clydell Hakim, MD;  Location: [facility];  Service: Neurosurgery;  Laterality: N/A;  LUMBAR SPINAL CORD STIMULATOR INSERTION   SPINE SURGERY  April 2013   Back X's 4   TOTAL HIP ARTHROPLASTY     right   WRIST FRACTURE SURGERY Bilateral     Allergies  Allergen Reactions   Phosphate     Other reaction(s): Unknown   Adrenalone    Codeine Other (See Comments)    Reaction:  Headaches and nightmares  Other reaction(s): Unknown   Lactose Other (See Comments)    abd pain, lactose intolerant   Lactose Intolerance (Gi) Nausea And Vomiting   Latex Rash   Lyrica [Pregabalin] Swelling and Other (See Comments)    Reaction:  Leg swelling   Morphine Other (See Comments)   Other Other (See Comments)    Pt states that pain medications give her nightmares.     Plaquenil [Hydroxychloroquine Sulfate] Other (See Comments)    Reaction:  GI upset    Reglan [Metoclopramide] Other (See Comments)    Reaction:  GI upset    Requip [Ropinirole Hcl] Other (See Comments)    Reaction:  GI upset    Septra  [Sulfamethoxazole-Trimethoprim] Nausea And Vomiting   Shellfish Allergy Nausea And Vomiting   Sulfa Antibiotics Other (See Comments)    Headache, very sick    Allergies as of 12/24/2022       Reactions   Phosphate    Other reaction(s): Unknown   Adrenalone    Codeine Other (See Comments)   Reaction:  Headaches and nightmares  Other reaction(s): Unknown   Lactose Other (See Comments)   abd pain, lactose intolerant   Lactose Intolerance (gi) Nausea And Vomiting   Latex Rash   Lyrica [pregabalin] Swelling, Other (See Comments)   Reaction:  Leg swelling   Morphine Other (See Comments)   Other Other (See Comments)   Pt states that pain medications give her nightmares.     Plaquenil [hydroxychloroquine Sulfate] Other (See Comments)   Reaction:  GI upset    Reglan [metoclopramide] Other (See Comments)   Reaction:  GI upset    Requip [ropinirole Hcl] Other (See Comments)   Reaction:  GI upset    Septra [sulfamethoxazole-trimethoprim] Nausea And Vomiting   Shellfish Allergy Nausea And Vomiting   Sulfa Antibiotics Other (See Comments)   Headache, very sick        Medication List        Accurate as of December 24, 2022 11:59 PM. If you have any questions, ask your nurse or doctor.          acetaminophen 500 MG tablet Commonly known as: TYLENOL Take 500 mg by mouth See admin instructions. Take 1 tablet (500 mg) by mouth scheduled 3 times daily scheduled & 3 times daily as needed for pain (Not to exceed 3g/24 hrs)   aspirin 81 MG chewable tablet Chew 81 mg by mouth daily.   Calcium 600/Vitamin D3 600-20 MG-MCG Tabs Generic drug: Calcium Carb-Cholecalciferol Take 1 tablet by mouth daily.   carbidopa-levodopa 25-100 MG tablet Commonly known as: SINEMET IR Take  1 tablet by mouth 3 (three) times daily.   Carbidopa-Levodopa ER 25-100 MG tablet controlled release Commonly known as: SINEMET CR Take 1 tablet by mouth daily.   Cranberry 125 MG Tabs Take 125 mg by mouth  daily.   docusate sodium 100 MG capsule Commonly known as: COLACE Take 100 mg by mouth at bedtime.   eucerin cream Apply 1 application topically daily.   famotidine 20 MG tablet Commonly known as: PEPCID Take 20 mg by mouth daily.   furosemide 20 MG tablet Commonly known as: LASIX Take 1 tablet (20 mg total) by mouth daily.   hydroxychloroquine 200 MG tablet Commonly known as: PLAQUENIL Take 200 mg by mouth daily.   lactose free nutrition Liqd Take by mouth 2 (two) times daily. 1/2 box; Resident likes chocolate. Twice A Day   levothyroxine 50 MCG tablet Commonly known as: SYNTHROID Take 50 mcg by mouth daily before breakfast.   loperamide 2 MG tablet Commonly known as: IMODIUM A-D Take 2 mg by mouth every 6 (six) hours as needed for diarrhea or loose stools.   lubiprostone 24 MCG capsule Commonly known as: AMITIZA Take 24 mcg by mouth 2 (two) times daily with a meal.   midodrine 5 MG tablet Commonly known as: PROAMATINE Take 1 tablet (5 mg total) by mouth 3 (three) times daily with meals.   multivitamin with minerals Tabs tablet Take 1 tablet by mouth daily.   nystatin powder Generic drug: nystatin Apply 1 Application topically daily. ABDOMINAL SKIN FOLD, UNDER BREAST AND GROIN   pantoprazole 40 MG tablet Commonly known as: PROTONIX Take 40 mg by mouth daily.   polyethylene glycol 17 g packet Commonly known as: MIRALAX / GLYCOLAX Take 17 g by mouth daily.   potassium citrate 10 MEQ (1080 MG) SR tablet Commonly known as: UROCIT-K Take 10 mEq by mouth 2 (two) times daily.   pramipexole 0.125 MG tablet Commonly known as: MIRAPEX Take 0.125 mg by mouth at bedtime.   PRESERVISION AREDS 2 PO Take 1 tablet by mouth daily.   Probiotic 250 MG Caps Take 250 mg by mouth daily.   QUEtiapine 25 MG tablet Commonly known as: SEROQUEL Take 1 tablet (25 mg total) by mouth 2 (two) times daily.   rosuvastatin 10 MG tablet Commonly known as: CRESTOR Take 10 mg  by mouth daily.   senna-docusate 8.6-50 MG tablet Commonly known as: Senokot-S Take 1 tablet by mouth at bedtime.   Sertraline HCl 150 MG Caps Take 125 mg by mouth at bedtime. What changed: Another medication with the same name was removed. Continue taking this medication, and follow the directions you see here. Changed by: Ianmichael Amescua X Ghazal Pevey, NP   Slow Fe 142 (45 Fe) MG Tbcr Generic drug: Ferrous Sulfate Take 142 mg by mouth in the morning.   Systane 0.4-0.3 % Soln Generic drug: Polyethyl Glycol-Propyl Glycol Apply 1 drop to eye 2 (two) times daily as needed (dry eyes).   Vitamin D 50 MCG (2000 UT) tablet Take 2,000 Units by mouth daily.        Review of Systems  Constitutional:  Positive for activity change, appetite change and fatigue. Negative for fever.  HENT:  Positive for congestion and hearing loss. Negative for sore throat and voice change.        Hoarseness   Eyes:  Negative for visual disturbance.       Dry eyes.   Respiratory:  Positive for shortness of breath. Negative for cough.  Chronic DOE  Cardiovascular:  Positive for leg swelling.  Gastrointestinal:  Negative for abdominal pain and constipation.  Genitourinary:  Positive for frequency. Negative for dysuria, hematuria and urgency.       Urinary leakage  Musculoskeletal:  Positive for arthralgias, back pain, gait problem and myalgias.       Chronic lower back pain. The R 3rd finger mid knuckle pain is chronic.   Skin:  Negative for color change.  Neurological:  Positive for tremors. Negative for speech difficulty, weakness and headaches.       Memory lapses. Minimal resting tremor in fingers, not disabling. RLS occasional symptomatic.   Psychiatric/Behavioral:  Positive for behavioral problems, confusion, dysphoric mood and hallucinations. Negative for agitation and sleep disturbance. The patient is nervous/anxious.        Improved paranoia    Immunization History  Administered Date(s) Administered    Hepatitis A, Adult 03/01/1997, 09/20/1997   Influenza Split 09/09/2013, 09/27/2014   Influenza, High Dose Seasonal PF 08/01/2011, 08/27/2012, 10/14/2013, 09/08/2015, 09/10/2016, 09/12/2017, 09/12/2018, 09/15/2019, 10/03/2022   Influenza,inj,Quad PF,6+ Mos 09/11/2016   Influenza-Unspecified 09/21/2020, 09/28/2021, 10/03/2022   Moderna SARS-COV2 Booster Vaccination 04/27/2022, 10/11/2022   Moderna Sars-Covid-2 Vaccination 12/12/2019, 01/09/2020, 10/18/2020, 05/09/2021, 08/29/2021   Pneumococcal Conjugate-13 04/15/2014   Pneumococcal Polysaccharide-23 09/28/2005   Td 06/20/2016   Tdap 04/29/2006, 09/01/2015   Zoster Recombinat (Shingrix) 06/11/2018, 09/10/2018   Zoster, Live 04/29/2006   Pertinent  Health Maintenance Due  Topic Date Due   INFLUENZA VACCINE  Completed   DEXA SCAN  Completed      10/10/2021   11:15 AM 03/13/2022    2:41 PM 09/25/2022    2:40 PM 09/28/2022   11:43 AM 11/27/2022   10:40 AM  Fall Risk  Falls in the past year? 1 0 0 0 0  Was there an injury with Fall? 0 0  0 0  Fall Risk Category Calculator 2 0  0 0  Fall Risk Category (Retired) Moderate Low  Low Low  (RETIRED) Patient Fall Risk Level Moderate fall risk    Low fall risk  Patient at Risk for Falls Due to    History of fall(s) History of fall(s)  Fall risk Follow up    Falls evaluation completed Falls evaluation completed   Functional Status Survey:    Vitals:   12/24/22 1517  BP: 138/70  Pulse: 84  Resp: 17  Temp: (!) 97.4 F (36.3 C)  SpO2: 100%  Weight: 113 lb 11.2 oz (51.6 kg)  Height: '4\' 8"'$  (1.422 m)   Body mass index is 25.49 kg/m. Physical Exam Vitals and nursing note reviewed.  Constitutional:      Comments: tired  HENT:     Head: Normocephalic and atraumatic.     Nose: Congestion and rhinorrhea present.     Mouth/Throat:     Mouth: Mucous membranes are moist.     Pharynx: No oropharyngeal exudate or posterior oropharyngeal erythema.  Eyes:     Extraocular Movements:  Extraocular movements intact.     Conjunctiva/sclera: Conjunctivae normal.     Pupils: Pupils are equal, round, and reactive to light.  Cardiovascular:     Rate and Rhythm: Normal rate and regular rhythm.     Heart sounds: Murmur heard.  Pulmonary:     Effort: Pulmonary effort is normal.     Breath sounds: No rales.  Abdominal:     General: Bowel sounds are normal.     Palpations: Abdomen is soft.     Tenderness: There  is no abdominal tenderness.     Comments: Suprapubic region discomfort when palpated.   Genitourinary:    Comments: Hx of reported protruded rectum was reduced on its own, a external hemorrhoid at 7pm, no s/s of injury.  Musculoskeletal:        General: Tenderness present.     Cervical back: Normal range of motion and neck supple.     Right lower leg: Edema present.     Left lower leg: Edema present.     Comments: Trace edema BLE.  Arthritic changes in fingers, R>L. The R 3rd finger mid knuckle swelling, no heat or redness. Chronic lower back pain.   Skin:    General: Skin is warm and dry.  Neurological:     General: No focal deficit present.     Mental Status: She is alert. Mental status is at baseline.     Gait: Gait abnormal.     Comments: Oriented to person, place. Ambulates with walker.   Psychiatric:     Comments: Visual hallucination vs delusion is chronic, improved paranoia and anxiety.      Labs reviewed: Recent Labs    01/11/22 0000 03/31/22 0000 09/25/22 0000  NA 141 140 143  K 4.4 4.6 4.4  CL 106 108 107  CO2 24* 23* 28*  BUN '20 20 17  '$ CREATININE 1.0 1.0 1.0  CALCIUM 9.5 9.4 10.1   Recent Labs    01/11/22 0000 03/31/22 0000 09/25/22 0000  AST  --  18 24  ALT  --  12 14  ALKPHOS  --  76 76  ALBUMIN 3.8 3.6 4.2   Recent Labs    03/31/22 0000 09/25/22 0000  WBC 8.8 6.7  NEUTROABS 5,342.00  --   HGB 9.6* 11.4*  HCT 30* 35*  PLT 248 288   Lab Results  Component Value Date   TSH 2.21 09/25/2022   No results found for:  "HGBA1C" Lab Results  Component Value Date   CHOL 137 01/11/2022   HDL 47 01/11/2022   LDLCALC 64 01/11/2022   TRIG 184 (A) 01/11/2022   CHOLHDL 2.4 07/14/2019    Significant Diagnostic Results in last 30 days:  No results found.  Assessment/Plan: COVID-19 virus infection hoarseness, fatigue, nasal congestion, tested positive for COVID. The patient is afebrile, denied sore throat, cough, or SOB, start Paxlovid 150/'100mg'$  bid x 5 days.   Recurrent UTI 12/13/22 urine culture Klebsiella >100,000c/ml, Augmentin 875/'125mg'$  bid x 7 days 12/16/22 Hx of rena lithiasis, recurrent UTI, urosepsis, treated by ID. 09/19/21 cystoscopy, laser lithotripsy, R ureteral stent/dilatation. Occasional dysuria present.     GERD (gastroesophageal reflux disease) , takes Pantoprazole, Famotidine, yearly FOBT per GI, Hgb 11.4 09/25/22  Parkinson's disease ([place])  takes Sinemet, MiraPex f/u Dr. Carles Collet, w/c for mobility most of time.   Restless leg syndrome  RLS stable on MiraPex, Sinemet hs, better sleep at night.   Depression, psychotic ([place])  Depression/hallucination, stabilizing,  takes Sertraline,  Quetiapine. Change Tramadol to prn helped. TSH 2.21 09/25/22  (HFpEF) heart failure with preserved ejection fraction (HCC)  CHF/trace edema BLE, takes Furosemide, cardiology, severe aortic valve stenosis. Echocardiogram 11/15/20 EF 60-65%. Bun/creat 16/1.0 12/19/2  CKD (chronic kidney disease) stage 3, GFR 30-59 ml/min (HCC) 11/27/22 Bun 16, creat 1.0, eGFR 54    Family/ staff Communication: plan of care reviewed with the patient and charge nurse.   Labs/tests ordered:  none  Time spend 35 minutes.

## 2022-12-24 NOTE — Assessment & Plan Note (Signed)
12/13/22 urine culture Klebsiella >100,000c/ml, Augmentin 875/'125mg'$  bid x 7 days 12/16/22 Hx of rena lithiasis, recurrent UTI, urosepsis, treated by ID. 09/19/21 cystoscopy, laser lithotripsy, R ureteral stent/dilatation. Occasional dysuria present.

## 2022-12-24 NOTE — Assessment & Plan Note (Signed)
,   takes Pantoprazole, Famotidine, yearly FOBT per GI, Hgb 11.4 09/25/22

## 2022-12-25 ENCOUNTER — Encounter: Payer: Self-pay | Admitting: Nurse Practitioner

## 2022-12-31 ENCOUNTER — Encounter: Payer: Self-pay | Admitting: Nurse Practitioner

## 2022-12-31 ENCOUNTER — Non-Acute Institutional Stay (SKILLED_NURSING_FACILITY): Payer: Medicare Other | Admitting: Nurse Practitioner

## 2022-12-31 DIAGNOSIS — G2581 Restless legs syndrome: Secondary | ICD-10-CM

## 2022-12-31 DIAGNOSIS — E039 Hypothyroidism, unspecified: Secondary | ICD-10-CM

## 2022-12-31 DIAGNOSIS — G20A1 Parkinson's disease without dyskinesia, without mention of fluctuations: Secondary | ICD-10-CM | POA: Diagnosis not present

## 2022-12-31 DIAGNOSIS — G3184 Mild cognitive impairment, so stated: Secondary | ICD-10-CM | POA: Diagnosis not present

## 2022-12-31 DIAGNOSIS — I5032 Chronic diastolic (congestive) heart failure: Secondary | ICD-10-CM

## 2022-12-31 DIAGNOSIS — U071 COVID-19: Secondary | ICD-10-CM | POA: Diagnosis not present

## 2022-12-31 DIAGNOSIS — K219 Gastro-esophageal reflux disease without esophagitis: Secondary | ICD-10-CM | POA: Diagnosis not present

## 2022-12-31 DIAGNOSIS — F323 Major depressive disorder, single episode, severe with psychotic features: Secondary | ICD-10-CM

## 2022-12-31 DIAGNOSIS — I10 Essential (primary) hypertension: Secondary | ICD-10-CM

## 2022-12-31 DIAGNOSIS — I951 Orthostatic hypotension: Secondary | ICD-10-CM

## 2022-12-31 DIAGNOSIS — M154 Erosive (osteo)arthritis: Secondary | ICD-10-CM

## 2022-12-31 NOTE — Assessment & Plan Note (Signed)
takes Levothyroxine TSH 2.21 09/25/22 

## 2022-12-31 NOTE — Assessment & Plan Note (Signed)
stabilizing,  takes Sertraline,  Quetiapine. Change Tramadol to prn helped. TSH 2.21 09/25/22

## 2022-12-31 NOTE — Progress Notes (Signed)
Location:   [facility] Room Number: 45A Place of Service:  SNF (31) Provider:  Mercia Dowe, Lennie Odor, NP  Iktan Aikman X, NP  Patient Care Team: Georgette Helmer X, NP as PCP - General (Internal Medicine) [name], MD as PCP - Cardiology (Cardiology) [name], MD (Neurosurgery) [name], MD (Neurology) [name], MD (Inactive) as Consulting Physician (Gastroenterology) [name], MD as Consulting Physician (Urology) Solan Vosler X, NP as Nurse Practitioner (Internal Medicine) Tat, Eustace Quail, DO as Consulting Physician (Neurology)  Extended Emergency Contact Information Primary Emergency Contact: [facility] Address: [address]          [place], [place] [address] [name] of [place] Phone: [phone] Mobile Phone: [phone] Relation: Spouse Secondary Emergency Contact: [name] Mobile Phone: [phone] Relation: Daughter Preferred language: [name] Interpreter needed? No  Code Status:  DNR Goals of care: Advanced Directive information    12/31/2022    8:49 AM  Advanced Directives  Does Patient Have a Medical Advance Directive? Yes  Type of Advance Directive Living will;Out of facility DNR (pink MOST or yellow form)  Does patient want to make changes to medical advance directive? No - Patient declined  Pre-existing out of facility DNR order (yellow form or pink MOST form) Yellow form placed in chart (order not valid for inpatient use)     Chief Complaint  Patient presents with   Medical Management of Chronic Issues    Routine follow up    HPI:  Pt is a 89 y.o. female seen today for medical management of chronic diseases.       Congestive cough, persisted hoarseness, fatigue, nasal congestion, tested positive for COVID, treated  Memory loss, progressing, SNF [facility] for supportive care.              Dry eyes, uses artificial tears.  Prolapsed rectum, avoid constipation, usually reduced on its  own.              Hx of rena lithiasis, recurrent UTI, urosepsis, treated by ID. 09/19/21 cystoscopy, laser lithotripsy, R ureteral stent/dilatation. Occasional dysuria present.              GERD, takes Pantoprazole, Famotidine, yearly FOBT per GI, Hgb 11.4 09/25/22             Parkinson's, takes Sinemet, MiraPex f/u Dr. Carles Collet, w/c for mobility most of time.   Hypotension, on Midodrine.              RLS stable on MiraPex, Sinemet hs, better sleep at night.              Depression/hallucination, stabilizing,  takes Sertraline,  Quetiapine. Change Tramadol to prn helped. TSH 2.21 09/25/22             Constipation, takes Senokot S, Amitiza, Colace, MiraLax              Hypothyroidism, takes Levothyroxine TSH 2.21 09/25/22             Erosive arthritis, aches and pains,  takes Plaquenil, Tylenol  f/u Rheumatology.              CHF/trace edema BLE, takes Furosemide, cardiology, severe aortic valve stenosis. Echocardiogram 11/15/20 EF 60-65%. Bun/creat 16/1.0 11/27/22             Hyperlipidemia, LDL 64 01/11/22, on Rosuvastatin.              Anemia, takes Fe, Hgb 10.2 11/27/22  AS/CAD Cardiology palliative approach. On Midodrine per Cardiology. More noticeable DOE, fatigue.      Past Medical History:  Diagnosis Date   Abnormal nuclear stress test    Actinic cheilitis 04/23/2019   Anemia    iron deficiency    Anxiety    Aortic stenosis    severe by echo 2021    Arthritis    Broken arm    right    Chronic diastolic CHF (congestive heart failure) ([place])    Chronic pain    Cognitive communication deficit    Cognitive deficits    Communication   Constipation    Depression    Erosive (osteo)arthritis 03/09/2020   Family history of adverse reaction to anesthesia    pt. states sister vomits   Fibromyalgia    GERD (gastroesophageal reflux disease)    H/O hiatal hernia    Hallucinations, visual 07/21/2018   03/10/20 psych consult.    Hematuria 05/18/2020   History of bronchitis     History of kidney stones    Hypercholesterolemia    Hypertension    dr t turner   Hypothyroidism    Memory loss 04/21/2018   08/08/18 CT head no traumatic findings, atrophy.  09/27/19 MMSE 25/30, failed the clock drawing   Microhematuria 03/24/2015   Mild CAD    Neuropathy    Osteoporosis    Parkinson disease    PVD (peripheral vascular disease) (HCC)    99% stenosis of left anteiror tibial artery, mod stenosis of left distal SFA and popliteal artery followed by [name]   RBBB    noted on EKG 2018   Restless leg syndrome    Sepsis ([place])    hx of due to Ecoli   Septic shock ([place]) 04/19/2020   Shortness of breath    with exertion - chronic    Sjogren's disease ([place])    Sjogren's syndrome ([place]) 02/04/2018   04/16/19 rheumatology: Erosive OA of hands, Sjogrens syndrome, low back pain at multiple sites, recurrent kidney stones, age related osteoporosis w/o current pathological fracture, f/u 6 months.    SOB (shortness of breath)    chronic due to diastolic dysfunction, deconditioning, obesity   Spinal stenosis    lumbar region    Tremor    Unstable gait 04/21/2018   Unsteadiness on feet    Urinary frequency 09/18/2018   Urinary tract infection    Visual hallucinations    Past Surgical History:  Procedure Laterality Date   ABDOMINAL HYSTERECTOMY     BACK SURGERY     4 back surgeries,   lumbar fusion   CARDIAC CATHETERIZATION  2006   normal   CYSTOSCOPY/URETEROSCOPY/HOLMIUM LASER/STENT PLACEMENT Left 01/06/2019   Procedure: CYSTOSCOPY/RETROGRADE/URETEROSCOPY/HOLMIUM LASER/BASKET RETRIEVAL/STENT PLACEMENT;  Surgeon: Ceasar Mons, MD;  Location: [facility];  Service: Urology;  Laterality: Left;   CYSTOSCOPY/URETEROSCOPY/HOLMIUM LASER/STENT PLACEMENT Bilateral 05/24/2020   Procedure: CYSTOSCOPY/RETROGRADE/URETEROSCOPY/HOLMIUM LASER/STENT PLACEMENT;  Surgeon: Ceasar Mons, MD;  Location: [facility];  Service: Urology;  Laterality: Bilateral;    CYSTOSCOPY/URETEROSCOPY/HOLMIUM LASER/STENT PLACEMENT Right 09/27/2021   Procedure: CYSTOSCOPY/BILATERAL RETROGRADE/URETEROSCOPY/HOLMIUM LASER/STENT PLACEMENT;  Surgeon: Ceasar Mons, MD;  Location: [facility];  Service: Urology;  Laterality: Right;  ONLY NEEDS 60 MIN   EYE SURGERY Bilateral    cateracts   falls     variious fall, broken wrist,and toes   FRACTURE SURGERY Right April 2016   Wrist, Pt. fell   IR NEPHROSTOMY PLACEMENT RIGHT  04/19/2020   kidney stone removal Left 01/20/2019   RIGHT/LEFT HEART  CATH AND CORONARY ANGIOGRAPHY N/A 02/05/2018   Procedure: RIGHT/LEFT HEART CATH AND CORONARY ANGIOGRAPHY;  Surgeon: Troy Sine, MD;  Location: [facility];  Service: Cardiovascular;  Laterality: N/A;   SPINAL CORD STIMULATOR INSERTION N/A 09/09/2015   Procedure: LUMBAR SPINAL CORD STIMULATOR INSERTION;  Surgeon: Clydell Hakim, MD;  Location: [facility];  Service: Neurosurgery;  Laterality: N/A;  LUMBAR SPINAL CORD STIMULATOR INSERTION   SPINE SURGERY  April 2013   Back X's 4   TOTAL HIP ARTHROPLASTY     right   WRIST FRACTURE SURGERY Bilateral     Allergies  Allergen Reactions   Phosphate     Other reaction(s): Unknown   Adrenalone    Codeine Other (See Comments)    Reaction:  Headaches and nightmares  Other reaction(s): Unknown   Lactose Other (See Comments)    abd pain, lactose intolerant   Lactose Intolerance (Gi) Nausea And Vomiting   Latex Rash   Lyrica [Pregabalin] Swelling and Other (See Comments)    Reaction:  Leg swelling   Morphine Other (See Comments)   Other Other (See Comments)    Pt states that pain medications give her nightmares.     Plaquenil [Hydroxychloroquine Sulfate] Other (See Comments)    Reaction:  GI upset    Reglan [Metoclopramide] Other (See Comments)    Reaction:  GI upset    Requip [Ropinirole Hcl] Other (See Comments)    Reaction:  GI upset    Septra [Sulfamethoxazole-Trimethoprim] Nausea And Vomiting   Shellfish Allergy  Nausea And Vomiting   Sulfa Antibiotics Other (See Comments)    Headache, very sick    Allergies as of 12/31/2022       Reactions   Phosphate    Other reaction(s): Unknown   Adrenalone    Codeine Other (See Comments)   Reaction:  Headaches and nightmares  Other reaction(s): Unknown   Lactose Other (See Comments)   abd pain, lactose intolerant   Lactose Intolerance (gi) Nausea And Vomiting   Latex Rash   Lyrica [pregabalin] Swelling, Other (See Comments)   Reaction:  Leg swelling   Morphine Other (See Comments)   Other Other (See Comments)   Pt states that pain medications give her nightmares.     Plaquenil [hydroxychloroquine Sulfate] Other (See Comments)   Reaction:  GI upset    Reglan [metoclopramide] Other (See Comments)   Reaction:  GI upset    Requip [ropinirole Hcl] Other (See Comments)   Reaction:  GI upset    Septra [sulfamethoxazole-trimethoprim] Nausea And Vomiting   Shellfish Allergy Nausea And Vomiting   Sulfa Antibiotics Other (See Comments)   Headache, very sick        Medication List        Accurate as of December 31, 2022 11:59 PM. If you have any questions, ask your nurse or doctor.          acetaminophen 500 MG tablet Commonly known as: TYLENOL Take 500 mg by mouth See admin instructions. Take 1 tablet (500 mg) by mouth scheduled 3 times daily scheduled & 3 times daily as needed for pain (Not to exceed 3g/24 hrs)   aspirin 81 MG chewable tablet Chew 81 mg by mouth daily.   Calcium 600/Vitamin D3 600-20 MG-MCG Tabs Generic drug: Calcium Carb-Cholecalciferol Take 1 tablet by mouth daily.   carbidopa-levodopa 25-100 MG tablet Commonly known as: SINEMET IR Take 1 tablet by mouth 3 (three) times daily.   Carbidopa-Levodopa ER 25-100 MG tablet controlled release  Commonly known as: SINEMET CR Take 1 tablet by mouth daily.   Cranberry 125 MG Tabs Take 125 mg by mouth daily.   docusate sodium 100 MG capsule Commonly known as: COLACE Take  100 mg by mouth at bedtime.   eucerin cream Apply 1 application topically daily.   famotidine 20 MG tablet Commonly known as: PEPCID Take 20 mg by mouth daily.   furosemide 20 MG tablet Commonly known as: LASIX Take 1 tablet (20 mg total) by mouth daily.   hydroxychloroquine 200 MG tablet Commonly known as: PLAQUENIL Take 200 mg by mouth daily.   lactose free nutrition Liqd Take by mouth 2 (two) times daily. 1/2 box; Resident likes chocolate. Twice A Day   levothyroxine 50 MCG tablet Commonly known as: SYNTHROID Take 50 mcg by mouth daily before breakfast.   loperamide 2 MG tablet Commonly known as: IMODIUM A-D Take 2 mg by mouth every 6 (six) hours as needed for diarrhea or loose stools.   lubiprostone 24 MCG capsule Commonly known as: AMITIZA Take 24 mcg by mouth 2 (two) times daily with a meal.   midodrine 5 MG tablet Commonly known as: PROAMATINE Take 1 tablet (5 mg total) by mouth 3 (three) times daily with meals.   multivitamin with minerals Tabs tablet Take 1 tablet by mouth daily.   nystatin powder Generic drug: nystatin Apply 1 Application topically daily. ABDOMINAL SKIN FOLD, UNDER BREAST AND GROIN   pantoprazole 40 MG tablet Commonly known as: PROTONIX Take 40 mg by mouth daily.   polyethylene glycol 17 g packet Commonly known as: MIRALAX / GLYCOLAX Take 17 g by mouth daily.   potassium citrate 10 MEQ (1080 MG) SR tablet Commonly known as: UROCIT-K Take 10 mEq by mouth 2 (two) times daily.   pramipexole 0.125 MG tablet Commonly known as: MIRAPEX Take 0.125 mg by mouth at bedtime.   PRESERVISION AREDS 2 PO Take 1 tablet by mouth daily.   Probiotic 250 MG Caps Take 250 mg by mouth daily.   QUEtiapine 25 MG tablet Commonly known as: SEROQUEL Take 1 tablet (25 mg total) by mouth 2 (two) times daily.   rosuvastatin 10 MG tablet Commonly known as: CRESTOR Take 10 mg by mouth daily.   senna-docusate 8.6-50 MG tablet Commonly known as:  Senokot-S Take 1 tablet by mouth at bedtime.   Sertraline HCl 150 MG Caps Take 125 mg by mouth at bedtime.   Slow Fe 142 (45 Fe) MG Tbcr Generic drug: Ferrous Sulfate Take 142 mg by mouth in the morning.   Systane 0.4-0.3 % Soln Generic drug: Polyethyl Glycol-Propyl Glycol Apply 1 drop to eye 2 (two) times daily as needed (dry eyes).   Vitamin D 50 MCG (2000 UT) tablet Take 2,000 Units by mouth daily.        Review of Systems  Constitutional:  Positive for activity change, appetite change and fatigue. Negative for fever.  HENT:  Positive for congestion and hearing loss. Negative for sore throat and voice change.        Hoarseness   Eyes:  Negative for visual disturbance.       Dry eyes.   Respiratory:  Positive for cough and shortness of breath. Negative for wheezing.        Chronic DOE  Cardiovascular:  Positive for leg swelling.  Gastrointestinal:  Negative for abdominal pain and constipation.  Genitourinary:  Positive for frequency. Negative for dysuria, hematuria and urgency.       Urinary leakage  Musculoskeletal:  Positive for arthralgias, back pain, gait problem and myalgias.       Chronic lower back pain. The R 3rd finger mid knuckle pain is chronic.   Skin:  Negative for color change.  Neurological:  Positive for tremors. Negative for speech difficulty, weakness and headaches.       Memory lapses. Minimal resting tremor in fingers, not disabling. RLS occasional symptomatic.   Psychiatric/Behavioral:  Positive for behavioral problems, confusion, dysphoric mood and hallucinations. Negative for agitation and sleep disturbance. The patient is nervous/anxious.        Improved paranoia    Immunization History  Administered Date(s) Administered   Hepatitis A, Adult 03/01/1997, 09/20/1997   Influenza Split 09/09/2013, 09/27/2014   Influenza, High Dose Seasonal PF 08/01/2011, 08/27/2012, 10/14/2013, 09/08/2015, 09/10/2016, 09/12/2017, 09/12/2018, 09/15/2019, 10/03/2022    Influenza,inj,Quad PF,6+ Mos 09/11/2016   Influenza-Unspecified 09/21/2020, 09/28/2021, 10/03/2022   Moderna SARS-COV2 Booster Vaccination 04/27/2022, 10/11/2022   Moderna Sars-Covid-2 Vaccination 12/12/2019, 01/09/2020, 10/18/2020, 05/09/2021, 08/29/2021   Pneumococcal Conjugate-13 04/15/2014   Pneumococcal Polysaccharide-23 09/28/2005   Td 06/20/2016   Tdap 04/29/2006, 09/01/2015   Zoster Recombinat (Shingrix) 06/11/2018, 09/10/2018   Zoster, Live 04/29/2006   Pertinent  Health Maintenance Due  Topic Date Due   INFLUENZA VACCINE  Completed   DEXA SCAN  Completed      10/10/2021   11:15 AM 03/13/2022    2:41 PM 09/25/2022    2:40 PM 09/28/2022   11:43 AM 11/27/2022   10:40 AM  Fall Risk  Falls in the past year? 1 0 0 0 0  Was there an injury with Fall? 0 0  0 0  Fall Risk Category Calculator 2 0  0 0  Fall Risk Category (Retired) Moderate Low  Low Low  (RETIRED) Patient Fall Risk Level Moderate fall risk    Low fall risk  Patient at Risk for Falls Due to    History of fall(s) History of fall(s)  Fall risk Follow up    Falls evaluation completed Falls evaluation completed   Functional Status Survey:    Vitals:   12/31/22 0842  BP: (!) 116/50  Pulse: 84  Resp: 18  Temp: (!) 97.5 F (36.4 C)  SpO2: 97%  Weight: 113 lb 3.2 oz (51.3 kg)  Height: '4\' 8"'$  (1.422 m)   Body mass index is 25.38 kg/m. Physical Exam Vitals and nursing note reviewed.  Constitutional:      Comments: tired  HENT:     Head: Normocephalic and atraumatic.     Nose: Congestion and rhinorrhea present.     Mouth/Throat:     Mouth: Mucous membranes are moist.     Pharynx: No oropharyngeal exudate or posterior oropharyngeal erythema.  Eyes:     Extraocular Movements: Extraocular movements intact.     Conjunctiva/sclera: Conjunctivae normal.     Pupils: Pupils are equal, round, and reactive to light.  Cardiovascular:     Rate and Rhythm: Normal rate and regular rhythm.     Heart sounds: Murmur  heard.  Pulmonary:     Effort: Pulmonary effort is normal.     Breath sounds: No wheezing, rhonchi or rales.  Abdominal:     General: Bowel sounds are normal.     Palpations: Abdomen is soft.     Tenderness: There is no abdominal tenderness.     Comments: Suprapubic region discomfort when palpated.   Genitourinary:    Comments: Hx of reported protruded rectum was reduced on its own, a external hemorrhoid at 7pm, no  s/s of injury.  Musculoskeletal:        General: Tenderness present.     Cervical back: Normal range of motion and neck supple.     Right lower leg: Edema present.     Left lower leg: Edema present.     Comments: Trace edema BLE.  Arthritic changes in fingers, R>L. The R 3rd finger mid knuckle swelling, no heat or redness. Chronic lower back pain.   Skin:    General: Skin is warm and dry.  Neurological:     General: No focal deficit present.     Mental Status: She is alert. Mental status is at baseline.     Gait: Gait abnormal.     Comments: Oriented to person, place. Ambulates with walker.   Psychiatric:     Comments: Visual hallucination vs delusion is chronic, improved paranoia and anxiety.      Labs reviewed: Recent Labs    01/11/22 0000 03/31/22 0000 09/25/22 0000  NA 141 140 143  K 4.4 4.6 4.4  CL 106 108 107  CO2 24* 23* 28*  BUN '20 20 17  '$ CREATININE 1.0 1.0 1.0  CALCIUM 9.5 9.4 10.1   Recent Labs    01/11/22 0000 03/31/22 0000 09/25/22 0000  AST  --  18 24  ALT  --  12 14  ALKPHOS  --  76 76  ALBUMIN 3.8 3.6 4.2   Recent Labs    03/31/22 0000 09/25/22 0000  WBC 8.8 6.7  NEUTROABS 5,342.00  --   HGB 9.6* 11.4*  HCT 30* 35*  PLT 248 288   Lab Results  Component Value Date   TSH 2.21 09/25/2022   No results found for: "HGBA1C" Lab Results  Component Value Date   CHOL 137 01/11/2022   HDL 47 01/11/2022   LDLCALC 64 01/11/2022   TRIG 184 (A) 01/11/2022   CHOLHDL 2.4 07/14/2019    Significant Diagnostic Results in last 30 days:   No results found.  Assessment/Plan COVID-19 virus infection  Congestive cough, persisted hoarseness, fatigue, nasal congestion, tested positive for COVID, treated 12/24/22. Will prn Guaifenesin, obtain CXR ap/lateral, CBC/diff, CMP/eGFR  Mild cognitive impairment Memory loss, progressing, SNF [facility] for supportive car  GERD (gastroesophageal reflux disease)  takes Pantoprazole, Famotidine, yearly FOBT per GI, Hgb 11.4 09/25/22  Parkinson's disease ([place])  takes Sinemet, MiraPex f/u Dr. Carles Collet, w/c for mobility most of time.   Restless leg syndrome RLS stable on MiraPex, Sinemet hs, better sleep at night  Depression, psychotic (HCC)  stabilizing,  takes Sertraline,  Quetiapine. Change Tramadol to prn helped. TSH 2.21 09/25/22  Hypothyroidism takes Levothyroxine TSH 2.21 09/25/22  Erosive (osteo)arthritis Erosive arthritis, aches and pains,  takes Plaquenil, Tylenol  f/u Rheumatology.   (HFpEF) heart failure with preserved ejection fraction (HCC)  CHF/trace edema BLE, takes Furosemide, cardiology, severe aortic valve stenosis. Echocardiogram 11/15/20 EF 60-65%. Bun/creat 16/1.0 11/27/22  Essential hypertension Runs low, on Midodrine, may consider holding Furosemide if indicated.   Orthostasis Intermittent low Bps, on Midodrine. May consider holding Furosemide if indicated.      Family/ staff Communication: plan of care reviewed with the patient and charge nurse.   Labs/tests ordered:  CBC/diff, CMP/eGFR, CXR ap/lateral   Time spend 35 minutes.

## 2022-12-31 NOTE — Assessment & Plan Note (Signed)
Memory loss, progressing, SNF [facility] for supportive car

## 2022-12-31 NOTE — Assessment & Plan Note (Signed)
Congestive cough, persisted hoarseness, fatigue, nasal congestion, tested positive for COVID, treated 12/24/22. Will prn Guaifenesin, obtain CXR ap/lateral, CBC/diff, CMP/eGFR

## 2022-12-31 NOTE — Assessment & Plan Note (Signed)
CHF/trace edema BLE, takes Furosemide, cardiology, severe aortic valve stenosis. Echocardiogram 11/15/20 EF 60-65%. Bun/creat 16/1.0 11/27/22

## 2022-12-31 NOTE — Assessment & Plan Note (Signed)
takes Sinemet, MiraPex f/u Dr. Tat, w/c for mobility most of time.  

## 2022-12-31 NOTE — Assessment & Plan Note (Signed)
takes Pantoprazole, Famotidine, yearly FOBT per GI, Hgb 11.4 09/25/22

## 2022-12-31 NOTE — Assessment & Plan Note (Signed)
Erosive arthritis, aches and pains,  takes Plaquenil, Tylenol  f/u Rheumatology.  

## 2022-12-31 NOTE — Assessment & Plan Note (Signed)
RLS stable on MiraPex, Sinemet hs, better sleep at night.  

## 2023-01-01 ENCOUNTER — Encounter: Payer: Self-pay | Admitting: Nurse Practitioner

## 2023-01-01 LAB — COMPREHENSIVE METABOLIC PANEL
Albumin: 3.7 (ref 3.5–5.0)
Calcium: 9.3 (ref 8.7–10.7)
Globulin: 2
eGFR: 57

## 2023-01-01 LAB — HEPATIC FUNCTION PANEL
ALT: 13 U/L (ref 7–35)
AST: 23 (ref 13–35)
Alkaline Phosphatase: 67 (ref 25–125)

## 2023-01-01 LAB — CBC AND DIFFERENTIAL
HCT: 27 — AB (ref 36–46)
Hemoglobin: 9 — AB (ref 12.0–16.0)
Neutrophils Absolute: 5246
Platelets: 225 10*3/uL (ref 150–400)
WBC: 7.9

## 2023-01-01 LAB — BASIC METABOLIC PANEL
BUN: 24 — AB (ref 4–21)
CO2: 25 — AB (ref 13–22)
Chloride: 107 (ref 99–108)
Creatinine: 1 (ref 0.5–1.1)
Glucose: 98
Potassium: 4.3 mEq/L (ref 3.5–5.1)
Sodium: 140 (ref 137–147)

## 2023-01-01 LAB — CBC: RBC: 2.81 — AB (ref 3.87–5.11)

## 2023-01-01 NOTE — Assessment & Plan Note (Signed)
Runs low, on Midodrine, may consider holding Furosemide if indicated.

## 2023-01-01 NOTE — Assessment & Plan Note (Signed)
Intermittent low Bps, on Midodrine. May consider holding Furosemide if indicated.

## 2023-01-14 ENCOUNTER — Ambulatory Visit (INDEPENDENT_AMBULATORY_CARE_PROVIDER_SITE_OTHER): Payer: Medicare Other | Admitting: Podiatry

## 2023-01-14 DIAGNOSIS — B351 Tinea unguium: Secondary | ICD-10-CM | POA: Diagnosis not present

## 2023-01-14 DIAGNOSIS — M79674 Pain in right toe(s): Secondary | ICD-10-CM

## 2023-01-14 DIAGNOSIS — M79675 Pain in left toe(s): Secondary | ICD-10-CM

## 2023-01-16 NOTE — Progress Notes (Signed)
Subjective: Chief Complaint  Patient presents with   Nail Problem    Thick painful toenails, 3 month follow up   89 y.o. returns the office today for painful, elongated, thickened toenails which she cannot trim herself.  No open lesions that she reports.  She has no new concerns.  PCP: [name], MD   Objective: AAO 3, NAD DP/PT pulses palpable, CRT less than 3 seconds Mild chronic bilateral lower extremity edema present. Nails hypertrophic, dystrophic, elongated, brittle, discolored 9. There is tenderness overlying the nails 1-5 on the right and 2 through 5 on the left.   No ulcerations bilaterally. No pain with calf compression, warmth, erythema.  Assessment: Patient presents with symptomatic onychomycosis, neuropathy  Plan: -Treatment options including alternatives, risks, complications were discussed -Nails sharply debrided 9 without complication/bleeding.  -Continue offloading for the toes. -Daily foot inspection.  Trula Slade DPM

## 2023-01-22 ENCOUNTER — Encounter: Payer: Self-pay | Admitting: Nurse Practitioner

## 2023-01-22 ENCOUNTER — Non-Acute Institutional Stay (SKILLED_NURSING_FACILITY): Payer: Medicare Other | Admitting: Nurse Practitioner

## 2023-01-22 DIAGNOSIS — I350 Nonrheumatic aortic (valve) stenosis: Secondary | ICD-10-CM | POA: Diagnosis not present

## 2023-01-22 DIAGNOSIS — F02B2 Dementia in other diseases classified elsewhere, moderate, with psychotic disturbance: Secondary | ICD-10-CM

## 2023-01-22 DIAGNOSIS — K219 Gastro-esophageal reflux disease without esophagitis: Secondary | ICD-10-CM

## 2023-01-22 DIAGNOSIS — E7800 Pure hypercholesterolemia, unspecified: Secondary | ICD-10-CM

## 2023-01-22 DIAGNOSIS — M154 Erosive (osteo)arthritis: Secondary | ICD-10-CM

## 2023-01-22 DIAGNOSIS — F323 Major depressive disorder, single episode, severe with psychotic features: Secondary | ICD-10-CM

## 2023-01-22 DIAGNOSIS — I5032 Chronic diastolic (congestive) heart failure: Secondary | ICD-10-CM | POA: Diagnosis not present

## 2023-01-22 DIAGNOSIS — E039 Hypothyroidism, unspecified: Secondary | ICD-10-CM

## 2023-01-22 DIAGNOSIS — G20A1 Parkinson's disease without dyskinesia, without mention of fluctuations: Secondary | ICD-10-CM

## 2023-01-22 DIAGNOSIS — D509 Iron deficiency anemia, unspecified: Secondary | ICD-10-CM | POA: Diagnosis not present

## 2023-01-22 DIAGNOSIS — I951 Orthostatic hypotension: Secondary | ICD-10-CM

## 2023-01-22 DIAGNOSIS — G2581 Restless legs syndrome: Secondary | ICD-10-CM

## 2023-01-22 DIAGNOSIS — K5909 Other constipation: Secondary | ICD-10-CM

## 2023-01-22 NOTE — Assessment & Plan Note (Signed)
takes Sinemet, MiraPex f/u Dr. Carles Collet, w/c for mobility most of time.

## 2023-01-22 NOTE — Assessment & Plan Note (Signed)
Erosive arthritis, aches and pains,  takes Plaquenil, Tylenol  f/u Rheumatology.

## 2023-01-22 NOTE — Assessment & Plan Note (Signed)
on Midodrine 

## 2023-01-22 NOTE — Assessment & Plan Note (Signed)
takes Levothyroxine TSH 2.21 09/25/22

## 2023-01-22 NOTE — Assessment & Plan Note (Signed)
,   progressing, SNF [facility] for supportive care. MMSE 15/30 11/28/22

## 2023-01-22 NOTE — Assessment & Plan Note (Signed)
Hgb dropped to 9s from 10s, off Fe, ASA, observe.

## 2023-01-22 NOTE — Progress Notes (Signed)
Location:  [facility] Room Number: 45A Place of Service:  SNF (31) Provider:  Blessings Inglett X, NP  Patient Care Team: Raney Koeppen X, NP as PCP - General (Internal Medicine) [name], MD as PCP - Cardiology (Cardiology) [name], MD (Neurosurgery) [name], MD (Neurology) [name], MD (Inactive) as Consulting Physician (Gastroenterology) [name], MD as Consulting Physician (Urology) Marvens Hollars X, NP as Nurse Practitioner (Internal Medicine) Tat, Eustace Quail, DO as Consulting Physician (Neurology)  Extended Emergency Contact Information Primary Emergency Contact: [facility] Address: [address]          [place], [place] [address] [name] of [place] Phone: [phone] Mobile Phone: [phone] Relation: Spouse Secondary Emergency Contact: [name] Mobile Phone: [phone] Relation: Daughter Preferred language: [name] Interpreter needed? No  Code Status:  DNR  Goals of care: Advanced Directive information    01/22/2023   11:46 AM  Advanced Directives  Does Patient Have a Medical Advance Directive? Yes  Type of Paramedic of [place];Living will;Out of facility DNR (pink MOST or yellow form)  Does patient want to make changes to medical advance directive? No - Patient declined  Copy of [place] in Chart? Yes - validated most recent copy scanned in chart (See row information)  Pre-existing out of facility DNR order (yellow form or pink MOST form) Pink MOST form placed in chart (order not valid for inpatient use);Yellow form placed in chart (order not valid for inpatient use)     Chief Complaint  Patient presents with   Medical Management of Chronic Issues    Routine Visit    HPI:  Pt is a 89 y.o. female seen today for medical management of chronic diseases.   Memory loss, progressing, SNF [facility] for supportive care.  MMSE 15/30 11/28/22              Dry eyes, uses artificial tears.  Prolapsed rectum, avoid constipation, usually reduced on its own.              Hx of rena lithiasis, recurrent UTI, urosepsis, treated by ID. 09/19/21 cystoscopy, laser lithotripsy, R ureteral stent/dilatation. Occasional dysuria present.              GERD, takes Pantoprazole, Famotidine, yearly FOBT per GI, Hgb 9.0 01/01/23             Parkinson's, takes Sinemet, MiraPex f/u Dr. Carles Collet, w/c for mobility most of time.              Hypotension, on Midodrine.              RLS stable on MiraPex, Sinemet hs, better sleep at night.              Depression/hallucination, stabilizing,  takes Sertraline,  Quetiapine. Change Tramadol to prn helped. TSH 2.21 09/25/22             Constipation, takes Senokot S, Amitiza, Colace, MiraLax              Hypothyroidism, takes Levothyroxine TSH 2.21 09/25/22             Erosive arthritis, aches and pains,  takes Plaquenil, Tylenol  f/u Rheumatology.              CHF/trace edema BLE, takes Furosemide, cardiology, severe aortic valve stenosis. Echocardiogram 11/15/20 EF 60-65%. Bun/creat 24/1.0 01/01/23             Hyperlipidemia, LDL  64 01/11/22, off Rosuvastatin.              Anemia, off Fe, ASA, Hgb 9.0 01/01/23             AS/CAD Cardiology palliative approach. On Midodrine per Cardiology. More noticeable DOE, fatigue.      Past Medical History:  Diagnosis Date   Abnormal nuclear stress test    Actinic cheilitis 04/23/2019   Anemia    iron deficiency    Anxiety    Aortic stenosis    severe by echo 2021    Arthritis    Broken arm    right    Chronic diastolic CHF (congestive heart failure) ([place])    Chronic pain    Cognitive communication deficit    Cognitive deficits    Communication   Constipation    Depression    Erosive (osteo)arthritis 03/09/2020   Family history of adverse reaction to anesthesia    pt. states sister vomits   Fibromyalgia    GERD (gastroesophageal reflux disease)    H/O hiatal hernia     Hallucinations, visual 07/21/2018   03/10/20 psych consult.    Hematuria 05/18/2020   History of bronchitis    History of kidney stones    Hypercholesterolemia    Hypertension    dr t turner   Hypothyroidism    Memory loss 04/21/2018   08/08/18 CT head no traumatic findings, atrophy.  09/27/19 MMSE 25/30, failed the clock drawing   Microhematuria 03/24/2015   Mild CAD    Neuropathy    Osteoporosis    Parkinson disease    PVD (peripheral vascular disease) (HCC)    99% stenosis of left anteiror tibial artery, mod stenosis of left distal SFA and popliteal artery followed by [name]   RBBB    noted on EKG 2018   Restless leg syndrome    Sepsis ([place])    hx of due to Ecoli   Septic shock ([place]) 04/19/2020   Shortness of breath    with exertion - chronic    Sjogren's disease ([place])    Sjogren's syndrome ([place]) 02/04/2018   04/16/19 rheumatology: Erosive OA of hands, Sjogrens syndrome, low back pain at multiple sites, recurrent kidney stones, age related osteoporosis w/o current pathological fracture, f/u 6 months.    SOB (shortness of breath)    chronic due to diastolic dysfunction, deconditioning, obesity   Spinal stenosis    lumbar region    Tremor    Unstable gait 04/21/2018   Unsteadiness on feet    Urinary frequency 09/18/2018   Urinary tract infection    Visual hallucinations    Past Surgical History:  Procedure Laterality Date   ABDOMINAL HYSTERECTOMY     BACK SURGERY     4 back surgeries,   lumbar fusion   CARDIAC CATHETERIZATION  2006   normal   CYSTOSCOPY/URETEROSCOPY/HOLMIUM LASER/STENT PLACEMENT Left 01/06/2019   Procedure: CYSTOSCOPY/RETROGRADE/URETEROSCOPY/HOLMIUM LASER/BASKET RETRIEVAL/STENT PLACEMENT;  Surgeon: Ceasar Mons, MD;  Location: [facility];  Service: Urology;  Laterality: Left;   CYSTOSCOPY/URETEROSCOPY/HOLMIUM LASER/STENT PLACEMENT Bilateral 05/24/2020   Procedure: CYSTOSCOPY/RETROGRADE/URETEROSCOPY/HOLMIUM LASER/STENT PLACEMENT;  Surgeon:  Ceasar Mons, MD;  Location: [facility];  Service: Urology;  Laterality: Bilateral;   CYSTOSCOPY/URETEROSCOPY/HOLMIUM LASER/STENT PLACEMENT Right 09/27/2021   Procedure: CYSTOSCOPY/BILATERAL RETROGRADE/URETEROSCOPY/HOLMIUM LASER/STENT PLACEMENT;  Surgeon: Ceasar Mons, MD;  Location: [facility];  Service: Urology;  Laterality: Right;  ONLY NEEDS 60 MIN   EYE SURGERY Bilateral    cateracts   falls  variious fall, broken wrist,and toes   FRACTURE SURGERY Right April 2016   Wrist, Pt. fell   IR NEPHROSTOMY PLACEMENT RIGHT  04/19/2020   kidney stone removal Left 01/20/2019   RIGHT/LEFT HEART CATH AND CORONARY ANGIOGRAPHY N/A 02/05/2018   Procedure: RIGHT/LEFT HEART CATH AND CORONARY ANGIOGRAPHY;  Surgeon: Troy Sine, MD;  Location: [facility];  Service: Cardiovascular;  Laterality: N/A;   SPINAL CORD STIMULATOR INSERTION N/A 09/09/2015   Procedure: LUMBAR SPINAL CORD STIMULATOR INSERTION;  Surgeon: Clydell Hakim, MD;  Location: [facility];  Service: Neurosurgery;  Laterality: N/A;  LUMBAR SPINAL CORD STIMULATOR INSERTION   SPINE SURGERY  April 2013   Back X's 4   TOTAL HIP ARTHROPLASTY     right   WRIST FRACTURE SURGERY Bilateral     Allergies  Allergen Reactions   Phosphate     Other reaction(s): Unknown   Adrenalone    Codeine Other (See Comments)    Reaction:  Headaches and nightmares  Other reaction(s): Unknown   Lactose Other (See Comments)    abd pain, lactose intolerant   Lactose Intolerance (Gi) Nausea And Vomiting   Latex Rash   Lyrica [Pregabalin] Swelling and Other (See Comments)    Reaction:  Leg swelling   Morphine Other (See Comments)   Other Other (See Comments)    Pt states that pain medications give her nightmares.     Plaquenil [Hydroxychloroquine Sulfate] Other (See Comments)    Reaction:  GI upset    Reglan [Metoclopramide] Other (See Comments)    Reaction:  GI upset    Requip [Ropinirole Hcl] Other (See Comments)     Reaction:  GI upset    Septra [Sulfamethoxazole-Trimethoprim] Nausea And Vomiting   Shellfish Allergy Nausea And Vomiting   Sulfa Antibiotics Other (See Comments)    Headache, very sick    Outpatient Encounter Medications as of 01/22/2023  Medication Sig   acetaminophen (TYLENOL) 500 MG tablet Take 500 mg by mouth See admin instructions. Take 1 tablet (500 mg) by mouth scheduled 3 times daily scheduled & 3 times daily as needed for pain (Not to exceed 3g/24 hrs)   carbidopa-levodopa (SINEMET IR) 25-100 MG tablet Take 1 tablet by mouth 3 (three) times daily.   Carbidopa-Levodopa ER (SINEMET CR) 25-100 MG tablet controlled release Take 1 tablet by mouth daily.   docusate sodium (COLACE) 100 MG capsule Take 100 mg by mouth at bedtime.   famotidine (PEPCID) 20 MG tablet Take 20 mg by mouth daily.   furosemide (LASIX) 20 MG tablet Take 1 tablet (20 mg total) by mouth daily.   hydroxychloroquine (PLAQUENIL) 200 MG tablet Take 200 mg by mouth daily.   lactose free nutrition (BOOST) LIQD Take by mouth 2 (two) times daily. 1/2 box; Resident likes chocolate. Twice A Day   levothyroxine (SYNTHROID, LEVOTHROID) 50 MCG tablet Take 50 mcg by mouth daily before breakfast.    loperamide (IMODIUM A-D) 2 MG tablet Take 2 mg by mouth every 6 (six) hours as needed for diarrhea or loose stools.   lubiprostone (AMITIZA) 24 MCG capsule Take 24 mcg by mouth 2 (two) times daily with a meal.    midodrine (PROAMATINE) 5 MG tablet Take 1 tablet (5 mg total) by mouth 3 (three) times daily with meals.   Multiple Vitamins-Minerals (PRESERVISION AREDS 2 PO) Take 1 tablet by mouth daily.   nystatin powder Apply 1 Application topically daily. ABDOMINAL SKIN FOLD, UNDER BREAST AND GROIN   pantoprazole (PROTONIX) 40 MG tablet  Take 40 mg by mouth daily.   Polyethyl Glycol-Propyl Glycol (SYSTANE) 0.4-0.3 % SOLN Apply 1 drop to eye 2 (two) times daily as needed (dry eyes).    polyethylene glycol (MIRALAX / GLYCOLAX) 17 g packet  Take 17 g by mouth daily.   potassium citrate (UROCIT-K) 10 MEQ (1080 MG) SR tablet Take 10 mEq by mouth 2 (two) times daily.   pramipexole (MIRAPEX) 0.125 MG tablet Take 0.125 mg by mouth at bedtime.   QUEtiapine (SEROQUEL) 25 MG tablet Take 1 tablet (25 mg total) by mouth 2 (two) times daily.   rosuvastatin (CRESTOR) 10 MG tablet Take 10 mg by mouth daily.   senna-docusate (SENOKOT-S) 8.6-50 MG tablet Take 1 tablet by mouth at bedtime.    Sertraline HCl 150 MG CAPS Take 125 mg by mouth at bedtime.   [DISCONTINUED] aspirin 81 MG chewable tablet Chew 81 mg by mouth daily.   [DISCONTINUED] Calcium Carb-Cholecalciferol (CALCIUM 600/VITAMIN D3) 600-800 MG-UNIT TABS Take 1 tablet by mouth daily.   [DISCONTINUED] Cholecalciferol (VITAMIN D) 50 MCG (2000 UT) tablet Take 2,000 Units by mouth daily.   [DISCONTINUED] Cranberry 125 MG TABS Take 125 mg by mouth daily.   [DISCONTINUED] Ferrous Sulfate (SLOW FE) 142 (45 Fe) MG TBCR Take 142 mg by mouth in the morning.   [DISCONTINUED] Multiple Vitamin (MULTIVITAMIN WITH MINERALS) TABS tablet Take 1 tablet by mouth daily.   [DISCONTINUED] Saccharomyces boulardii (PROBIOTIC) 250 MG CAPS Take 250 mg by mouth daily.   [DISCONTINUED] Skin Protectants, Misc. (EUCERIN) cream Apply 1 application topically daily.   No facility-administered encounter medications on file as of 01/22/2023.    Review of Systems  Constitutional:  Negative for appetite change, fatigue and fever.  HENT:  Positive for hearing loss. Negative for congestion, sore throat and voice change.   Eyes:  Negative for visual disturbance.       Dry eyes.   Respiratory:  Negative for cough, shortness of breath and wheezing.        Chronic DOE  Cardiovascular:  Positive for leg swelling.  Gastrointestinal:  Negative for abdominal pain and constipation.  Genitourinary:  Positive for frequency. Negative for dysuria, hematuria and urgency.       Urinary leakage  Musculoskeletal:  Positive for  arthralgias, back pain, gait problem and myalgias.       Chronic lower back pain. The R 3rd finger mid knuckle pain is chronic.   Skin:  Negative for color change.  Neurological:  Positive for tremors. Negative for speech difficulty, weakness and headaches.       Memory lapses. Minimal resting tremor in fingers, not disabling. RLS occasional symptomatic.   Psychiatric/Behavioral:  Positive for behavioral problems, confusion, dysphoric mood and hallucinations. Negative for agitation and sleep disturbance. The patient is nervous/anxious.        Improved paranoia    Immunization History  Administered Date(s) Administered   Hepatitis A, Adult 03/01/1997, 09/20/1997   Influenza Split 09/09/2013, 09/27/2014   Influenza, High Dose Seasonal PF 08/01/2011, 08/27/2012, 10/14/2013, 09/08/2015, 09/10/2016, 09/12/2017, 09/12/2018, 09/15/2019, 10/03/2022   Influenza,inj,Quad PF,6+ Mos 09/11/2016   Influenza-Unspecified 09/21/2020, 09/28/2021, 10/03/2022   Moderna SARS-COV2 Booster Vaccination 04/27/2022, 10/11/2022   Moderna Sars-Covid-2 Vaccination 12/12/2019, 01/09/2020, 10/18/2020, 05/09/2021, 08/29/2021   Pneumococcal Conjugate-13 04/15/2014   Pneumococcal Polysaccharide-23 09/28/2005   Td 06/20/2016   Tdap 04/29/2006, 09/01/2015   Zoster Recombinat (Shingrix) 06/11/2018, 09/10/2018   Zoster, Live 04/29/2006   Pertinent  Health Maintenance Due  Topic Date Due   INFLUENZA VACCINE  Completed  DEXA SCAN  Completed      03/13/2022    2:41 PM 09/25/2022    2:40 PM 09/28/2022   11:43 AM 11/27/2022   10:40 AM 01/22/2023   11:45 AM  Fall Risk  Falls in the past year? 0 0 0 0 0  Was there an injury with Fall? 0  0 0 0  Fall Risk Category Calculator 0  0 0 0  Fall Risk Category (Retired) Low  Low Low   (RETIRED) Patient Fall Risk Level    Low fall risk   Patient at Risk for Falls Due to   History of fall(s) History of fall(s) History of fall(s)  Fall risk Follow up   Falls evaluation completed  Falls evaluation completed Falls evaluation completed   Functional Status Survey:    Vitals:   01/22/23 1127  BP: (!) 112/54  Pulse: 75  Resp: 17  Temp: (!) 97.4 F (36.3 C)  SpO2: 94%  Weight: 116 lb 1 oz (52.6 kg)  Height: 4' 8"$  (1.422 m)   Body mass index is 26.02 kg/m. Physical Exam Vitals and nursing note reviewed.  Constitutional:      Appearance: Normal appearance.  HENT:     Head: Normocephalic and atraumatic.     Nose: Nose normal.     Mouth/Throat:     Mouth: Mucous membranes are moist.  Eyes:     Extraocular Movements: Extraocular movements intact.     Conjunctiva/sclera: Conjunctivae normal.     Pupils: Pupils are equal, round, and reactive to light.  Cardiovascular:     Rate and Rhythm: Normal rate and regular rhythm.     Heart sounds: Murmur heard.  Pulmonary:     Effort: Pulmonary effort is normal.     Breath sounds: No rales.  Abdominal:     General: Bowel sounds are normal.     Palpations: Abdomen is soft.     Tenderness: There is no abdominal tenderness.     Comments: Suprapubic region discomfort when palpated.   Genitourinary:    Comments: Hx of reported protruded rectum was reduced on its own, a external hemorrhoid at 7pm, no s/s of injury.  Musculoskeletal:        General: Tenderness present.     Cervical back: Normal range of motion and neck supple.     Right lower leg: Edema present.     Left lower leg: Edema present.     Comments: Trace edema BLE.  Arthritic changes in fingers, R>L. The R 3rd finger mid knuckle swelling, no heat or redness. Chronic lower back pain.   Skin:    General: Skin is warm and dry.  Neurological:     General: No focal deficit present.     Mental Status: She is alert. Mental status is at baseline.     Gait: Gait abnormal.     Comments: Oriented to person, place. Ambulates with walker.   Psychiatric:     Comments: Visual hallucination vs delusion is chronic, improved paranoia and anxiety.      Labs  reviewed: Recent Labs    03/31/22 0000 09/25/22 0000 01/01/23 0630  NA 140 143 140  K 4.6 4.4 4.3  CL 108 107 107  CO2 23* 28* 25*  BUN 20 17 24*  CREATININE 1.0 1.0 1.0  CALCIUM 9.4 10.1 9.3   Recent Labs    03/31/22 0000 09/25/22 0000 01/01/23 0630  AST 18 24 23  $ ALT 12 14 13  $ ALKPHOS 76 76 67  ALBUMIN 3.6 4.2 3.7   Recent Labs    03/31/22 0000 09/25/22 0000 12/13/22 0630 01/01/23 0630  WBC 8.8 6.7 6.5 7.9  NEUTROABS 5,342.00  --  3,569.00 5,246.00  HGB 9.6* 11.4* 10.6* 9.0*  HCT 30* 35* 32* 27*  PLT 248 288 259 225   Lab Results  Component Value Date   TSH 2.21 09/25/2022   No results found for: "HGBA1C" Lab Results  Component Value Date   CHOL 137 01/11/2022   HDL 47 01/11/2022   LDLCALC 64 01/11/2022   TRIG 184 (A) 01/11/2022   CHOLHDL 2.4 07/14/2019    Significant Diagnostic Results in last 30 days:  No results found.  Assessment/Plan Anemia Hgb dropped to 9s from 10s, off Fe, ASA, observe.   Severe aortic stenosis - not a candidate for TAVR  AS/CAD Cardiology palliative approach. On Midodrine per Cardiology. More noticeable DOE, fatigue.   Hypercholesterolemia LDL 64 01/11/22, off Rosuvastatin.   (HFpEF) heart failure with preserved ejection fraction (HCC) LDL 64 01/11/22, on Rosuvastatin.   Erosive (osteo)arthritis   Erosive arthritis, aches and pains,  takes Plaquenil, Tylenol  f/u Rheumatology.   Hypothyroidism  takes Levothyroxine TSH 2.21 09/25/22  Chronic constipation Stable,  takes Senokot S, Amitiza, Colace, MiraLax   Depression, psychotic (HCC) Depression/hallucination, stabilizing,  takes Sertraline,  Quetiapine. Change Tramadol to prn helped. TSH 2.21 09/25/22  Restless leg syndrome RLS stable on MiraPex, Sinemet hs, better sleep at night.   Orthostasis on Midodrine.   Parkinson's disease ([place])  takes Sinemet, MiraPex f/u Dr. Carles Collet, w/c for mobility most of time.   GERD (gastroesophageal reflux disease) Stable, takes  Pantoprazole, Famotidine, yearly FOBT per GI, Hgb 9.0 01/01/23     Family/ staff Communication: plan of care reviewed with the patient and charge nurse.   Labs/tests ordered:  none  Time spend 35 minutes.

## 2023-01-22 NOTE — Assessment & Plan Note (Signed)
LDL 64 01/11/22, on Rosuvastatin.

## 2023-01-22 NOTE — Assessment & Plan Note (Signed)
Stable, takes Pantoprazole, Famotidine, yearly FOBT per GI, Hgb 9.0 01/01/23

## 2023-01-22 NOTE — Assessment & Plan Note (Signed)
Depression/hallucination, stabilizing,  takes Sertraline,  Quetiapine. Change Tramadol to prn helped. TSH 2.21 09/25/22

## 2023-01-22 NOTE — Assessment & Plan Note (Signed)
RLS stable on MiraPex, Sinemet hs, better sleep at night.

## 2023-01-22 NOTE — Assessment & Plan Note (Addendum)
LDL 64 01/11/22, off Rosuvastatin.

## 2023-01-22 NOTE — Assessment & Plan Note (Signed)
AS/CAD Cardiology palliative approach. On Midodrine per Cardiology. More noticeable DOE, fatigue.

## 2023-01-22 NOTE — Assessment & Plan Note (Signed)
Stable,  takes Senokot S, Amitiza, Colace, MiraLax

## 2023-01-25 ENCOUNTER — Ambulatory Visit: Payer: Medicare Other | Admitting: Cardiology

## 2023-02-12 ENCOUNTER — Non-Acute Institutional Stay (SKILLED_NURSING_FACILITY): Payer: Medicare Other | Admitting: Family Medicine

## 2023-02-12 DIAGNOSIS — M154 Erosive (osteo)arthritis: Secondary | ICD-10-CM

## 2023-02-12 DIAGNOSIS — R1319 Other dysphagia: Secondary | ICD-10-CM

## 2023-02-12 DIAGNOSIS — I10 Essential (primary) hypertension: Secondary | ICD-10-CM

## 2023-02-12 DIAGNOSIS — G20A1 Parkinson's disease without dyskinesia, without mention of fluctuations: Secondary | ICD-10-CM

## 2023-02-12 DIAGNOSIS — R2681 Unsteadiness on feet: Secondary | ICD-10-CM

## 2023-02-12 DIAGNOSIS — U071 COVID-19: Secondary | ICD-10-CM

## 2023-02-12 DIAGNOSIS — R300 Dysuria: Secondary | ICD-10-CM | POA: Diagnosis not present

## 2023-02-12 DIAGNOSIS — G3184 Mild cognitive impairment, so stated: Secondary | ICD-10-CM

## 2023-02-12 DIAGNOSIS — I5032 Chronic diastolic (congestive) heart failure: Secondary | ICD-10-CM

## 2023-02-12 DIAGNOSIS — I9589 Other hypotension: Secondary | ICD-10-CM | POA: Diagnosis not present

## 2023-02-12 DIAGNOSIS — I350 Nonrheumatic aortic (valve) stenosis: Secondary | ICD-10-CM

## 2023-02-12 NOTE — Progress Notes (Signed)
Provider:  Alain Honey, MD Location:      Place of Service:     PCP: [name], Man X, NP Patient Care Team: [name], Man X, NP as PCP - General (Internal Medicine) [name], MD as PCP - Cardiology (Cardiology) [name], MD (Neurosurgery) [name], MD (Neurology) [name], MD (Inactive) as Consulting Physician (Gastroenterology) [name], MD as Consulting Physician (Urology) [name], Man X, NP as Nurse Practitioner (Internal Medicine) Tat, Eustace Quail, DO as Consulting Physician (Neurology)  Extended Emergency Contact Information Primary Emergency Contact: [facility] Address: [address]          [place], [place] [address] [name] of [place] Phone: [phone] Mobile Phone: [phone] Relation: Spouse Secondary Emergency Contact: [name] Mobile Phone: [phone] Relation: Daughter Preferred language: [name] Interpreter needed? No  Code Status:  Goals of Care: Advanced Directive information    01/22/2023   11:46 AM  Advanced Directives  Does Patient Have a Medical Advance Directive? Yes  Type of Paramedic of [place];Living will;Out of facility DNR (pink MOST or yellow form)  Does patient want to make changes to medical advance directive? No - Patient declined  Copy of [place] in Chart? Yes - validated most recent copy scanned in chart (See row information)  Pre-existing out of facility DNR order (yellow form or pink MOST form) Pink MOST form placed in chart (order not valid for inpatient use);Yellow form placed in chart (order not valid for inpatient use)      No chief complaint on file.   HPI: Patient is a 89 y.o. female seen today for medical management of chronic problems including weight loss, Parkinson's, hypotension, restless leg, and depression with hallucinations. I visited [name] and her husband together and they are her room today.  He is concerned  that her brain is "playing tricks on her.".  She is seeing some things that are not real.  She does have Parkinson's and takes Sinemet and Mirapex but also takes Seroquel for her hallucinations, and midodrine for hypotension.  No specific treatment for dementia except supportive care. She does complain today of what sounds like urge incontinence  Past Medical History:  Diagnosis Date   Abnormal nuclear stress test    Actinic cheilitis 04/23/2019   Anemia    iron deficiency    Anxiety    Aortic stenosis    severe by echo 2021    Arthritis    Broken arm    right    Chronic diastolic CHF (congestive heart failure) ([place])    Chronic pain    Cognitive communication deficit    Cognitive deficits    Communication   Constipation    Depression    Erosive (osteo)arthritis 03/09/2020   Family history of adverse reaction to anesthesia    pt. states sister vomits   Fibromyalgia    GERD (gastroesophageal reflux disease)    H/O hiatal hernia    Hallucinations, visual 07/21/2018   03/10/20 psych consult.    Hematuria 05/18/2020   History of bronchitis    History of kidney stones    Hypercholesterolemia    Hypertension    dr t turner   Hypothyroidism    Memory loss 04/21/2018   08/08/18 CT head no traumatic findings, atrophy.  09/27/19 MMSE 25/30, failed the clock drawing   Microhematuria 03/24/2015   Mild CAD    Neuropathy    Osteoporosis    Parkinson disease    PVD (peripheral vascular  disease) ([place])    99% stenosis of left anteiror tibial artery, mod stenosis of left distal SFA and popliteal artery followed by [name]   RBBB    noted on EKG 2018   Restless leg syndrome    Sepsis ([place])    hx of due to [facility]   Septic shock ([place]) 04/19/2020   Shortness of breath    with exertion - chronic    Sjogren's disease ([place])    Sjogren's syndrome ([place]) 02/04/2018   04/16/19 rheumatology: Erosive OA of hands, Sjogrens syndrome, low back pain at multiple sites, recurrent kidney stones, age  related osteoporosis w/o current pathological fracture, f/u 6 months.    SOB (shortness of breath)    chronic due to diastolic dysfunction, deconditioning, obesity   Spinal stenosis    lumbar region    Tremor    Unstable gait 04/21/2018   Unsteadiness on feet    Urinary frequency 09/18/2018   Urinary tract infection    Visual hallucinations    Past Surgical History:  Procedure Laterality Date   ABDOMINAL HYSTERECTOMY     BACK SURGERY     4 back surgeries,   lumbar fusion   CARDIAC CATHETERIZATION  2006   normal   CYSTOSCOPY/URETEROSCOPY/HOLMIUM LASER/STENT PLACEMENT Left 01/06/2019   Procedure: CYSTOSCOPY/RETROGRADE/URETEROSCOPY/HOLMIUM LASER/BASKET RETRIEVAL/STENT PLACEMENT;  Surgeon: Ceasar Mons, MD;  Location: [facility];  Service: Urology;  Laterality: Left;   CYSTOSCOPY/URETEROSCOPY/HOLMIUM LASER/STENT PLACEMENT Bilateral 05/24/2020   Procedure: CYSTOSCOPY/RETROGRADE/URETEROSCOPY/HOLMIUM LASER/STENT PLACEMENT;  Surgeon: Ceasar Mons, MD;  Location: [facility];  Service: Urology;  Laterality: Bilateral;   CYSTOSCOPY/URETEROSCOPY/HOLMIUM LASER/STENT PLACEMENT Right 09/27/2021   Procedure: CYSTOSCOPY/BILATERAL RETROGRADE/URETEROSCOPY/HOLMIUM LASER/STENT PLACEMENT;  Surgeon: Ceasar Mons, MD;  Location: [facility];  Service: Urology;  Laterality: Right;  ONLY NEEDS 60 MIN   EYE SURGERY Bilateral    cateracts   falls     variious fall, broken wrist,and toes   FRACTURE SURGERY Right April 2016   Wrist, Pt. fell   IR NEPHROSTOMY PLACEMENT RIGHT  04/19/2020   kidney stone removal Left 01/20/2019   RIGHT/LEFT HEART CATH AND CORONARY ANGIOGRAPHY N/A 02/05/2018   Procedure: RIGHT/LEFT HEART CATH AND CORONARY ANGIOGRAPHY;  Surgeon: Troy Sine, MD;  Location: [facility];  Service: Cardiovascular;  Laterality: N/A;   SPINAL CORD STIMULATOR INSERTION N/A 09/09/2015   Procedure: LUMBAR SPINAL CORD STIMULATOR INSERTION;  Surgeon: Clydell Hakim, MD;   Location: [facility];  Service: Neurosurgery;  Laterality: N/A;  LUMBAR SPINAL CORD STIMULATOR INSERTION   SPINE SURGERY  April 2013   Back X's 4   TOTAL HIP ARTHROPLASTY     right   WRIST FRACTURE SURGERY Bilateral     reports that she has never smoked. She has never used smokeless tobacco. She reports that she does not drink alcohol and does not use drugs. Social History   Socioeconomic History   Marital status: Married    Spouse name: [name]   Number of children: 2   Years of education: HS   Highest education level: Not on file  Occupational History    Comment: Homemaker  Tobacco Use   Smoking status: Never   Smokeless tobacco: Never  Vaping Use   Vaping Use: Never used  Substance and Sexual Activity   Alcohol use: No    Alcohol/week: 0.0 standard drinks of alcohol   Drug use: No   Sexual activity: Never  Other Topics Concern   Not on file  Social History Narrative   Patient lives  at [facility] with her spouse. Married in 1957. Two people lives in the home, no pets.    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    Caffeine Use: tea   Social Determinants of Health   Financial Resource Strain: Low Risk  (10/27/2018)   Overall Financial Resource Strain (CARDIA)    Difficulty of Paying Living Expenses: Not hard at all  Food Insecurity: No Food Insecurity (10/27/2018)   Hunger Vital Sign    Worried About Running Out of Food in the Last Year: Never true    Ran Out of Food in the Last Year: Never true  Transportation Needs: No Transportation Needs (10/27/2018)   PRAPARE - Hydrologist (Medical): No    Lack of Transportation (Non-Medical): No  Physical Activity: Insufficiently Active (10/27/2018)   Exercise Vital Sign    Days of Exercise per Week: 2 days    Minutes of Exercise per Session: 30 min  Stress: No Stress Concern Present (10/27/2018)   [place]    Feeling of Stress : Only a little  Social Connections: Socially Integrated (10/27/2018)   Social Connection and Isolation Panel [NHANES]    Frequency of Communication with Friends and Family: More than three times a week    Frequency of Social Gatherings with Friends and Family: More than three times a week    Attends Religious Services: More than 4 times per year    Active Member of Clubs or Organizations: Yes    Attends Archivist Meetings: More than 4 times per year    Marital Status: Married  Human resources officer Violence: Not At Risk (10/27/2018)   Humiliation, Afraid, Rape, and Kick questionnaire    Fear of Current or Ex-Partner: No    Emotionally Abused: No    Physically Abused: No    Sexually Abused: No    Functional Status Survey:    Family History  Problem Relation Age of Onset   Heart attack Mother    Restless legs syndrome Mother    Heart failure Mother    Heart disease Mother    Hypertension Mother    COPD Father     Health Maintenance  Topic Date Due   COVID-19 Vaccine (6 - 2023-24 season) 12/06/2022   DTaP/Tdap/Td (4 - Td or Tdap) 06/20/2026   Pneumonia Vaccine 10+ Years old  Completed   INFLUENZA VACCINE  Completed   DEXA SCAN  Completed   Zoster Vaccines- Shingrix  Completed   HPV VACCINES  Aged Out    Allergies  Allergen Reactions   Phosphate     Other reaction(s): Unknown   Adrenalone    Codeine Other (See Comments)    Reaction:  Headaches and nightmares  Other reaction(s): Unknown   Lactose Other (See Comments)    abd pain, lactose intolerant   Lactose Intolerance (Gi) Nausea And Vomiting   Latex Rash   Lyrica [Pregabalin] Swelling and Other (See Comments)    Reaction:  Leg swelling   Morphine Other (See Comments)   Other Other (See Comments)    Pt states that pain medications give her nightmares.     Plaquenil [Hydroxychloroquine Sulfate] Other (See Comments)    Reaction:  GI upset    Reglan  [Metoclopramide] Other (See Comments)    Reaction:  GI upset    Requip [Ropinirole Hcl] Other (See Comments)    Reaction:  GI upset    Septra [Sulfamethoxazole-Trimethoprim] Nausea And Vomiting   Shellfish Allergy Nausea And Vomiting   Sulfa Antibiotics Other (See Comments)    Headache, very sick    Outpatient Encounter Medications as of 02/12/2023  Medication Sig   acetaminophen (TYLENOL) 500 MG tablet Take 500 mg by mouth See admin instructions. Take 1 tablet (500 mg) by mouth scheduled 3 times daily scheduled & 3 times daily as needed for pain (Not to exceed 3g/24 hrs)   carbidopa-levodopa (SINEMET IR) 25-100 MG tablet Take 1 tablet by mouth 3 (three) times daily.   Carbidopa-Levodopa ER (SINEMET CR) 25-100 MG tablet controlled release Take 1 tablet by mouth daily.   docusate sodium (COLACE) 100 MG capsule Take 100 mg by mouth at bedtime.   famotidine (PEPCID) 20 MG tablet Take 20 mg by mouth daily.   furosemide (LASIX) 20 MG tablet Take 1 tablet (20 mg total) by mouth daily.   hydroxychloroquine (PLAQUENIL) 200 MG tablet Take 200 mg by mouth daily.   lactose free nutrition (BOOST) LIQD Take by mouth 2 (two) times daily. 1/2 box; Resident likes chocolate. Twice A Day   levothyroxine (SYNTHROID, LEVOTHROID) 50 MCG tablet Take 50 mcg by mouth daily before breakfast.    loperamide (IMODIUM A-D) 2 MG tablet Take 2 mg by mouth every 6 (six) hours as needed for diarrhea or loose stools.   lubiprostone (AMITIZA) 24 MCG capsule Take 24 mcg by mouth 2 (two) times daily with a meal.    midodrine (PROAMATINE) 5 MG tablet Take 1 tablet (5 mg total) by mouth 3 (three) times daily with meals.   Multiple Vitamins-Minerals (PRESERVISION AREDS 2 PO) Take 1 tablet by mouth daily.   nystatin powder Apply 1 Application topically daily. ABDOMINAL SKIN FOLD, UNDER BREAST AND GROIN   pantoprazole (PROTONIX) 40 MG tablet Take 40 mg by mouth daily.   Polyethyl Glycol-Propyl Glycol (SYSTANE) 0.4-0.3 % SOLN Apply  1 drop to eye 2 (two) times daily as needed (dry eyes).    polyethylene glycol (MIRALAX / GLYCOLAX) 17 g packet Take 17 g by mouth daily.   potassium citrate (UROCIT-K) 10 MEQ (1080 MG) SR tablet Take 10 mEq by mouth 2 (two) times daily.   pramipexole (MIRAPEX) 0.125 MG tablet Take 0.125 mg by mouth at bedtime.   QUEtiapine (SEROQUEL) 25 MG tablet Take 1 tablet (25 mg total) by mouth 2 (two) times daily.   rosuvastatin (CRESTOR) 10 MG tablet Take 10 mg by mouth daily.   senna-docusate (SENOKOT-S) 8.6-50 MG tablet Take 1 tablet by mouth at bedtime.    Sertraline HCl 150 MG CAPS Take 125 mg by mouth at bedtime.   No facility-administered encounter medications on file as of 02/12/2023.    Review of Systems  Constitutional: Negative.   HENT: Negative.    Respiratory: Negative.    Cardiovascular: Negative.   Endocrine: Positive for polyuria.  Genitourinary:  Positive for frequency.  Neurological: Negative.   Psychiatric/Behavioral:  Positive for confusion and hallucinations.   All other systems reviewed and are negative.   There were no vitals filed for this visit. There is no height or weight on file to calculate BMI. Physical Exam Vitals and nursing note reviewed.  Constitutional:      Appearance: Normal appearance.  HENT:     Mouth/Throat:     Mouth: Mucous membranes are moist.     Pharynx: Oropharynx is clear.  Cardiovascular:     Rate and Rhythm: Normal rate and regular rhythm.  Pulmonary:  Effort: Pulmonary effort is normal.     Breath sounds: Normal breath sounds.  Abdominal:     General: Bowel sounds are normal.     Palpations: Abdomen is soft.  Neurological:     General: No focal deficit present.     Mental Status: She is alert.     Comments: Oriented to person  Psychiatric:        Mood and Affect: Mood normal.        Behavior: Behavior normal.     Labs reviewed: Basic Metabolic Panel: Recent Labs    03/31/22 0000 09/25/22 0000 01/01/23 0630  NA 140 143  140  K 4.6 4.4 4.3  CL 108 107 107  CO2 23* 28* 25*  BUN 20 17 24*  CREATININE 1.0 1.0 1.0  CALCIUM 9.4 10.1 9.3   Liver Function Tests: Recent Labs    03/31/22 0000 09/25/22 0000 01/01/23 0630  AST '18 24 23  '$ ALT '12 14 13  '$ ALKPHOS 76 76 67  ALBUMIN 3.6 4.2 3.7   No results for input(s): "LIPASE", "AMYLASE" in the last 8760 hours. No results for input(s): "AMMONIA" in the last 8760 hours. CBC: Recent Labs    03/31/22 0000 09/25/22 0000 12/13/22 0630 01/01/23 0630  WBC 8.8 6.7 6.5 7.9  NEUTROABS 5,342.00  --  3,569.00 5,246.00  HGB 9.6* 11.4* 10.6* 9.0*  HCT 30* 35* 32* 27*  PLT 248 288 259 225   Cardiac Enzymes: No results for input(s): "CKTOTAL", "CKMB", "CKMBINDEX", "TROPONINI" in the last 8760 hours. BNP: Invalid input(s): "POCBNP" No results found for: "HGBA1C" Lab Results  Component Value Date   TSH 2.21 09/25/2022   No results found for: "VITAMINB12" No results found for: "FOLATE" No results found for: "IRON", "TIBC", "FERRITIN"  Imaging and Procedures obtained prior to SNF admission: DG C-Arm 1-60 Min  Result Date: 09/27/2021 CLINICAL DATA:  Cystoscopy with right ureteroscopy and laser lithotripsy. Right ureteral stent placement. Right ureteral dilatation. Bilateral retrograde pyelogram. EXAM: DG C-ARM 1-60 MIN CONTRAST:  Not provided FLUOROSCOPY TIME:  Fluoroscopy Time:  1 minute 9 seconds Radiation Exposure Index (if provided by the fluoroscopic device): 14.4 mGy Number of Acquired Spot Images: 6 COMPARISON:  CT urogram 09/08/2021 FINDINGS: Submitted intraoperative fluoroscopic images demonstrate balloon dilatation the distal right ureter. Right ureteral stent is seen. There is cannulation of the distal left ureter with retrograde opacification of the renal collecting system and ureter. IMPRESSION: Intraoperative images of left retrograde pyelogram and right ureteral dilation and stent placement as above. Electronically Signed   By: Miachel Roux M.D.   On:  09/27/2021 14:26    Assessment/Plan 1. Chronic heart failure with preserved ejection fraction (HCC) Takes furosemide 20 mg along with potassium supplement  2. Other specified hypotension Midodrine is effective.  Likely a result of Parkinson's  3. COVID-19 virus infection Infection treated patient symptoms seem to have resolved  4. Dysuria History of UTIs also has what sounds like urge incontinence consider med for overactive bladder likely Myrbetriq  5. Erosive (osteo)arthritis Takes Plaquenil  6. Essential hypertension She takes midodrine to increase her blood pressure she is off all antihypertensives  7. Mild cognitive impairment She does get confused from time to time but generally able to carry on a conversation  8. Parkinson's disease without fluctuating manifestations, unspecified whether dyskinesia present Followed by neurology symptoms are fairly well-controlled.  Does have some hallucinations now.  Using Sinequan.  May be related to Parkinson's  9. Other dysphagia History of Barrett's.  Protonix  as well as famotidine  10. Severe aortic stenosis - not a candidate for TAVR No obvious symptoms of failure, no syncope  11. Unstable gait Uses wheelchair for ambulation    Family/ staff Communication:   Labs/tests ordered:  .[email or web address]

## 2023-02-19 ENCOUNTER — Other Ambulatory Visit: Payer: Self-pay | Admitting: Nurse Practitioner

## 2023-03-05 ENCOUNTER — Encounter: Payer: Self-pay | Admitting: Nurse Practitioner

## 2023-03-05 ENCOUNTER — Non-Acute Institutional Stay (SKILLED_NURSING_FACILITY): Payer: Medicare Other | Admitting: Nurse Practitioner

## 2023-03-05 DIAGNOSIS — K219 Gastro-esophageal reflux disease without esophagitis: Secondary | ICD-10-CM | POA: Diagnosis not present

## 2023-03-05 DIAGNOSIS — I951 Orthostatic hypotension: Secondary | ICD-10-CM | POA: Diagnosis not present

## 2023-03-05 DIAGNOSIS — I350 Nonrheumatic aortic (valve) stenosis: Secondary | ICD-10-CM

## 2023-03-05 DIAGNOSIS — G20A1 Parkinson's disease without dyskinesia, without mention of fluctuations: Secondary | ICD-10-CM | POA: Diagnosis not present

## 2023-03-05 DIAGNOSIS — F02B2 Dementia in other diseases classified elsewhere, moderate, with psychotic disturbance: Secondary | ICD-10-CM

## 2023-03-05 DIAGNOSIS — I5032 Chronic diastolic (congestive) heart failure: Secondary | ICD-10-CM

## 2023-03-05 DIAGNOSIS — G2581 Restless legs syndrome: Secondary | ICD-10-CM

## 2023-03-05 DIAGNOSIS — N200 Calculus of kidney: Secondary | ICD-10-CM

## 2023-03-05 DIAGNOSIS — E039 Hypothyroidism, unspecified: Secondary | ICD-10-CM

## 2023-03-05 DIAGNOSIS — M154 Erosive (osteo)arthritis: Secondary | ICD-10-CM

## 2023-03-05 DIAGNOSIS — K5909 Other constipation: Secondary | ICD-10-CM

## 2023-03-05 DIAGNOSIS — F323 Major depressive disorder, single episode, severe with psychotic features: Secondary | ICD-10-CM

## 2023-03-05 DIAGNOSIS — N390 Urinary tract infection, site not specified: Secondary | ICD-10-CM

## 2023-03-05 NOTE — Progress Notes (Addendum)
Location:  [facility] Room Number: 45A Place of Service:  SNF (31) Provider:  Kathlean Cinco X, NP  Patient Care Team: Jlyn Cerros X, NP as PCP - General (Internal Medicine) [name], MD as PCP - Cardiology (Cardiology) [name], MD (Neurosurgery) [name], MD (Neurology) [name], MD (Inactive) as Consulting Physician (Gastroenterology) [name], MD as Consulting Physician (Urology) Hao Dion X, NP as Nurse Practitioner (Internal Medicine) Tat, Eustace Quail, DO as Consulting Physician (Neurology)  Extended Emergency Contact Information Primary Emergency Contact: [facility] Address: [address]          [place], [place] [address] [name] of [place] Phone: [phone] Mobile Phone: [phone] Relation: Spouse Secondary Emergency Contact: [name] Mobile Phone: [phone] Relation: Daughter Preferred language: [name] Interpreter needed? No  Code Status:  DNR  Goals of care: Advanced Directive information    03/05/2023    8:32 AM  Advanced Directives  Does Patient Have a Medical Advance Directive? Yes  Type of Paramedic of [place];Living will;Out of facility DNR (pink MOST or yellow form)  Does patient want to make changes to medical advance directive? No - Patient declined  Copy of [place] in Chart? Yes - validated most recent copy scanned in chart (See row information)  Pre-existing out of facility DNR order (yellow form or pink MOST form) Pink MOST/Yellow Form most recent copy in chart - Physician notified to receive inpatient order     Chief Complaint  Patient presents with   Medical Management of Chronic Issues    Routine Visit    HPI:  Pt is a 89 y.o. female seen today for medical management of chronic diseases.    Memory loss, progressing, SNF [facility] for supportive care.  MMSE 15/30 11/28/22, under Hospice care.              Dry  eyes, uses artificial tears.  Prolapsed rectum, avoid constipation, usually reduced on its own.              Hx of rena lithiasis, recurrent UTI, urosepsis, treated by ID. 09/19/21 cystoscopy, laser lithotripsy, R ureteral stent/dilatation. Occasional dysuria present.              GERD, takes Pantoprazole, Famotidine, yearly FOBT per GI, Hgb 9.0 01/01/23             Parkinson's, takes Sinemet, MiraPex f/u Dr. Carles Collet, w/c for mobility most of time.              Hypotension, on Midodrine.              RLS stable on MiraPex, Sinemet hs, better sleep at night.              Depression/hallucination, stabilizing,  takes Sertraline,  Quetiapine. Change Tramadol to prn helped. TSH 2.21 09/25/22             Constipation, takes Senokot S, Amitiza, Colace, MiraLax              Hypothyroidism, takes Levothyroxine TSH 2.21 09/25/22             Erosive arthritis, aches and pains,  takes Plaquenil, Tylenol  f/u Rheumatology.              CHF/trace edema BLE, takes Furosemide, cardiology, severe aortic valve stenosis. Echocardiogram 11/15/20 EF 60-65%. Bun/creat 24/1.0 01/01/23             Hyperlipidemia, LDL 64  01/11/22, off Rosuvastatin.              Anemia, off Fe, ASA, Hgb 9.0 01/01/23             AS/CAD Cardiology palliative approach. On Midodrine per Cardiology. More noticeable DOE, fatigue.    Past Medical History:  Diagnosis Date   Abnormal nuclear stress test    Actinic cheilitis 04/23/2019   Anemia    iron deficiency    Anxiety    Aortic stenosis    severe by echo 2021    Arthritis    Broken arm    right    Chronic diastolic CHF (congestive heart failure) ([place])    Chronic pain    Cognitive communication deficit    Cognitive deficits    Communication   Constipation    Depression    Erosive (osteo)arthritis 03/09/2020   Family history of adverse reaction to anesthesia    pt. states sister vomits   Fibromyalgia    GERD (gastroesophageal reflux disease)    H/O hiatal hernia    Hallucinations,  visual 07/21/2018   03/10/20 psych consult.    Hematuria 05/18/2020   History of bronchitis    History of kidney stones    Hypercholesterolemia    Hypertension    dr t turner   Hypothyroidism    Memory loss 04/21/2018   08/08/18 CT head no traumatic findings, atrophy.  09/27/19 MMSE 25/30, failed the clock drawing   Microhematuria 03/24/2015   Mild CAD    Neuropathy    Osteoporosis    Parkinson disease    PVD (peripheral vascular disease) (HCC)    99% stenosis of left anteiror tibial artery, mod stenosis of left distal SFA and popliteal artery followed by [name]   RBBB    noted on EKG 2018   Restless leg syndrome    Sepsis ([place])    hx of due to Ecoli   Septic shock ([place]) 04/19/2020   Shortness of breath    with exertion - chronic    Sjogren's disease ([place])    Sjogren's syndrome ([place]) 02/04/2018   04/16/19 rheumatology: Erosive OA of hands, Sjogrens syndrome, low back pain at multiple sites, recurrent kidney stones, age related osteoporosis w/o current pathological fracture, f/u 6 months.    SOB (shortness of breath)    chronic due to diastolic dysfunction, deconditioning, obesity   Spinal stenosis    lumbar region    Tremor    Unstable gait 04/21/2018   Unsteadiness on feet    Urinary frequency 09/18/2018   Urinary tract infection    Visual hallucinations    Past Surgical History:  Procedure Laterality Date   ABDOMINAL HYSTERECTOMY     BACK SURGERY     4 back surgeries,   lumbar fusion   CARDIAC CATHETERIZATION  2006   normal   CYSTOSCOPY/URETEROSCOPY/HOLMIUM LASER/STENT PLACEMENT Left 01/06/2019   Procedure: CYSTOSCOPY/RETROGRADE/URETEROSCOPY/HOLMIUM LASER/BASKET RETRIEVAL/STENT PLACEMENT;  Surgeon: Ceasar Mons, MD;  Location: [facility];  Service: Urology;  Laterality: Left;   CYSTOSCOPY/URETEROSCOPY/HOLMIUM LASER/STENT PLACEMENT Bilateral 05/24/2020   Procedure: CYSTOSCOPY/RETROGRADE/URETEROSCOPY/HOLMIUM LASER/STENT PLACEMENT;  Surgeon: Ceasar Mons, MD;  Location: [facility];  Service: Urology;  Laterality: Bilateral;   CYSTOSCOPY/URETEROSCOPY/HOLMIUM LASER/STENT PLACEMENT Right 09/27/2021   Procedure: CYSTOSCOPY/BILATERAL RETROGRADE/URETEROSCOPY/HOLMIUM LASER/STENT PLACEMENT;  Surgeon: Ceasar Mons, MD;  Location: [facility];  Service: Urology;  Laterality: Right;  ONLY NEEDS 60 MIN   EYE SURGERY Bilateral    cateracts   falls     variious fall, broken  wrist,and toes   FRACTURE SURGERY Right April 2016   Wrist, Pt. fell   IR NEPHROSTOMY PLACEMENT RIGHT  04/19/2020   kidney stone removal Left 01/20/2019   RIGHT/LEFT HEART CATH AND CORONARY ANGIOGRAPHY N/A 02/05/2018   Procedure: RIGHT/LEFT HEART CATH AND CORONARY ANGIOGRAPHY;  Surgeon: Troy Sine, MD;  Location: [facility];  Service: Cardiovascular;  Laterality: N/A;   SPINAL CORD STIMULATOR INSERTION N/A 09/09/2015   Procedure: LUMBAR SPINAL CORD STIMULATOR INSERTION;  Surgeon: Clydell Hakim, MD;  Location: [facility];  Service: Neurosurgery;  Laterality: N/A;  LUMBAR SPINAL CORD STIMULATOR INSERTION   SPINE SURGERY  April 2013   Back X's 4   TOTAL HIP ARTHROPLASTY     right   WRIST FRACTURE SURGERY Bilateral     Allergies  Allergen Reactions   Phosphate     Other reaction(s): Unknown   Adrenalone    Codeine Other (See Comments)    Reaction:  Headaches and nightmares  Other reaction(s): Unknown   Lactose Other (See Comments)    abd pain, lactose intolerant   Lactose Intolerance (Gi) Nausea And Vomiting   Latex Rash   Lyrica [Pregabalin] Swelling and Other (See Comments)    Reaction:  Leg swelling   Morphine Other (See Comments)   Other Other (See Comments)    Pt states that pain medications give her nightmares.     Plaquenil [Hydroxychloroquine Sulfate] Other (See Comments)    Reaction:  GI upset    Reglan [Metoclopramide] Other (See Comments)    Reaction:  GI upset    Requip [Ropinirole Hcl] Other (See Comments)    Reaction:  GI upset     Septra [Sulfamethoxazole-Trimethoprim] Nausea And Vomiting   Shellfish Allergy Nausea And Vomiting   Sulfa Antibiotics Other (See Comments)    Headache, very sick    Outpatient Encounter Medications as of 03/05/2023  Medication Sig   carbidopa-levodopa (SINEMET IR) 25-100 MG tablet Take 1 tablet by mouth 3 (three) times daily.   Carbidopa-Levodopa ER (SINEMET CR) 25-100 MG tablet controlled release Take 1 tablet by mouth daily.   docusate sodium (COLACE) 100 MG capsule Take 100 mg by mouth at bedtime.   famotidine (PEPCID) 20 MG tablet Take 20 mg by mouth daily.   furosemide (LASIX) 20 MG tablet Take 1 tablet (20 mg total) by mouth daily.   hydroxychloroquine (PLAQUENIL) 200 MG tablet Take 200 mg by mouth daily.   lactose free nutrition (BOOST) LIQD Take by mouth 2 (two) times daily. 1/2 box; Resident likes chocolate. Twice A Day   levothyroxine (SYNTHROID, LEVOTHROID) 50 MCG tablet Take 50 mcg by mouth daily before breakfast.    lubiprostone (AMITIZA) 24 MCG capsule Take 24 mcg by mouth 2 (two) times daily with a meal.    midodrine (PROAMATINE) 5 MG tablet Take 1 tablet (5 mg total) by mouth 3 (three) times daily with meals.   Multiple Vitamins-Minerals (PRESERVISION AREDS 2 PO) Take 1 tablet by mouth daily.   nystatin powder Apply 1 Application topically daily. ABDOMINAL SKIN FOLD, UNDER BREAST AND GROIN   pantoprazole (PROTONIX) 40 MG tablet Take 40 mg by mouth daily.   Polyethyl Glycol-Propyl Glycol (SYSTANE) 0.4-0.3 % SOLN Apply 1 drop to eye 2 (two) times daily as needed (dry eyes).    polyethylene glycol (MIRALAX / GLYCOLAX) 17 g packet Take 17 g by mouth daily.   potassium citrate (UROCIT-K) 10 MEQ (1080 MG) SR tablet Take 10 mEq by mouth 2 (two) times daily.  pramipexole (MIRAPEX) 0.125 MG tablet Take 0.125 mg by mouth at bedtime.   QUEtiapine (SEROQUEL) 25 MG tablet Take 1 tablet (25 mg total) by mouth 2 (two) times daily.   senna-docusate (SENOKOT-S) 8.6-50 MG tablet Take 1  tablet by mouth at bedtime.    Sertraline HCl 150 MG CAPS Take 125 mg by mouth at bedtime.   [DISCONTINUED] rosuvastatin (CRESTOR) 10 MG tablet Take 10 mg by mouth daily.   No facility-administered encounter medications on file as of 03/05/2023.    Review of Systems  Unable to perform ROS: Dementia    Immunization History  Administered Date(s) Administered   Hepatitis A, Adult 03/01/1997, 09/20/1997   Influenza Split 09/09/2013, 09/27/2014   Influenza, High Dose Seasonal PF 08/01/2011, 08/27/2012, 10/14/2013, 09/08/2015, 09/10/2016, 09/12/2017, 09/12/2018, 09/15/2019, 10/03/2022   Influenza,inj,Quad PF,6+ Mos 09/11/2016   Influenza-Unspecified 09/21/2020, 09/28/2021, 10/03/2022   Moderna SARS-COV2 Booster Vaccination 04/27/2022, 10/11/2022   Moderna Sars-Covid-2 Vaccination 12/12/2019, 01/09/2020, 10/18/2020, 05/09/2021, 08/29/2021   Pneumococcal Conjugate-13 04/15/2014   Pneumococcal Polysaccharide-23 09/28/2005   Td 06/20/2016   Tdap 04/29/2006, 09/01/2015   Zoster Recombinat (Shingrix) 06/11/2018, 09/10/2018   Zoster, Live 04/29/2006   Pertinent  Health Maintenance Due  Topic Date Due   INFLUENZA VACCINE  07/11/2023   DEXA SCAN  Completed      09/25/2022    2:40 PM 09/28/2022   11:43 AM 11/27/2022   10:40 AM 01/22/2023   11:45 AM 03/05/2023    8:29 AM  Fall Risk  Falls in the past year? 0 0 0 0 0  Was there an injury with Fall?  0 0 0 0  Fall Risk Category Calculator  0 0 0 0  Fall Risk Category (Retired)  Low Low    (RETIRED) Patient Fall Risk Level   Low fall risk    Patient at Risk for Falls Due to  History of fall(s) History of fall(s) History of fall(s) History of fall(s)  Fall risk Follow up  Falls evaluation completed Falls evaluation completed Falls evaluation completed Falls evaluation completed   Functional Status Survey:    Vitals:   03/05/23 0818  BP: 122/68  Pulse: 74  Resp: 16  Temp: 98.3 F (36.8 C)  SpO2: 95%  Weight: 110 lb 3 oz (50 kg)   Height: 4\' 8"  (1.422 m)   Body mass index is 24.7 kg/m. Physical Exam Vitals and nursing note reviewed.  Constitutional:      Appearance: Normal appearance.  HENT:     Head: Normocephalic and atraumatic.     Nose: Nose normal.     Mouth/Throat:     Mouth: Mucous membranes are moist.  Eyes:     Extraocular Movements: Extraocular movements intact.     Conjunctiva/sclera: Conjunctivae normal.     Pupils: Pupils are equal, round, and reactive to light.  Cardiovascular:     Rate and Rhythm: Normal rate and regular rhythm.     Heart sounds: Murmur heard.  Pulmonary:     Effort: Pulmonary effort is normal.     Breath sounds: No rales.  Abdominal:     General: Bowel sounds are normal.     Palpations: Abdomen is soft.     Tenderness: There is no abdominal tenderness. There is no right CVA tenderness, left CVA tenderness, guarding or rebound.     Comments: Suprapubic region discomfort when palpated.   Genitourinary:    Comments: Hx of reported protruded rectum was reduced on its own, a external hemorrhoid at 7pm, no s/s of  injury.  Musculoskeletal:        General: Tenderness present.     Cervical back: Normal range of motion and neck supple.     Right lower leg: Edema present.     Left lower leg: Edema present.     Comments: Trace edema BLE.  Arthritic changes in fingers, R>L. The R 3rd finger mid knuckle swelling, no heat or redness. Chronic lower back pain.   Skin:    General: Skin is warm and dry.  Neurological:     General: No focal deficit present.     Mental Status: She is alert. Mental status is at baseline.     Gait: Gait abnormal.     Comments: Oriented to person, place. Ambulates with walker.   Psychiatric:     Comments: Visual hallucination vs delusion is chronic, improved paranoia and anxiety.      Labs reviewed: Recent Labs    03/31/22 0000 09/25/22 0000 01/01/23 0630  NA 140 143 140  K 4.6 4.4 4.3  CL 108 107 107  CO2 23* 28* 25*  BUN 20 17 24*   CREATININE 1.0 1.0 1.0  CALCIUM 9.4 10.1 9.3   Recent Labs    03/31/22 0000 09/25/22 0000 01/01/23 0630  AST 18 24 23   ALT 12 14 13   ALKPHOS 76 76 67  ALBUMIN 3.6 4.2 3.7   Recent Labs    03/31/22 0000 09/25/22 0000 12/13/22 0630 01/01/23 0630  WBC 8.8 6.7 6.5 7.9  NEUTROABS 5,342.00  --  3,569.00 5,246.00  HGB 9.6* 11.4* 10.6* 9.0*  HCT 30* 35* 32* 27*  PLT 248 288 259 225   Lab Results  Component Value Date   TSH 2.21 09/25/2022   No results found for: "HGBA1C" Lab Results  Component Value Date   CHOL 137 01/11/2022   HDL 47 01/11/2022   LDLCALC 64 01/11/2022   TRIG 184 (A) 01/11/2022   CHOLHDL 2.4 07/14/2019    Significant Diagnostic Results in last 30 days:  No results found.  Assessment/Plan Renal lithiasis Hx of rena lithiasis, recurrent UTI, urosepsis, treated by ID. 09/19/21 cystoscopy, laser lithotripsy, R ureteral stent/dilatation. Occasional dysuria present.  03/03/23 reported c/o pain/burning with urination, UA C/S if symptoms persists. Unable to obtain HPI 2/2 dementia.   GERD (gastroesophageal reflux disease) takes Pantoprazole, Famotidine, yearly FOBT per GI, Hgb 9.0 01/01/23  Parkinson's disease ([place])  takes Sinemet, MiraPex f/u Dr. Carles Collet, w/c for mobility most of time.   Orthostasis  on Midodrine.   Restless leg syndrome stable on MiraPex, Sinemet hs, better sleep at night.   Depression, psychotic ([place])  stabilizing,  takes Sertraline,  Quetiapine. Change Tramadol to prn helped. TSH 2.21 09/25/22  Chronic constipation Stable,  takes Senokot S, Amitiza, Colace, MiraLax   Hypothyroidism takes Levothyroxine TSH 2.21 09/25/22  Erosive (osteo)arthritis , aches and pains,  takes Plaquenil, Tylenol  f/u Rheumatology.   (HFpEF) heart failure with preserved ejection fraction (HCC) , takes Furosemide, cardiology, severe aortic valve stenosis. Echocardiogram 11/15/20 EF 60-65%. Bun/creat 24/1.0 01/01/23  Severe aortic stenosis - not a  candidate for TAVR   AS/CAD Cardiology palliative approach. On Midodrine per Cardiology. More noticeable DOE, fatigue.    Parkinson's disease dementia ([place]) SNF [facility] for supportive care.  MMSE 15/30 11/28/22, under Hospice care.   Recurrent UTI 03/06/23 urine culture Klebsiella pneumoniae, variicola, Cipro 500mg  bid x7 days.      Family/ staff Communication: plan of care reviewed with the patient and charge nurse.   Labs/tests  ordered:  UA C/S  Time spend 35 minutes.

## 2023-03-05 NOTE — Assessment & Plan Note (Signed)
on Midodrine 

## 2023-03-05 NOTE — Assessment & Plan Note (Signed)
AS/CAD Cardiology palliative approach. On Midodrine per Cardiology. More noticeable DOE, fatigue.  

## 2023-03-05 NOTE — Assessment & Plan Note (Signed)
,   aches and pains,  takes Plaquenil, Tylenol  f/u Rheumatology.

## 2023-03-05 NOTE — Assessment & Plan Note (Addendum)
Hx of rena lithiasis, recurrent UTI, urosepsis, treated by ID. 09/19/21 cystoscopy, laser lithotripsy, R ureteral stent/dilatation. Occasional dysuria present.  03/03/23 reported c/o pain/burning with urination, UA C/S if symptoms persists. Unable to obtain HPI 2/2 dementia.

## 2023-03-05 NOTE — Assessment & Plan Note (Signed)
takes Sinemet, MiraPex f/u Dr. Tat, w/c for mobility most of time.  

## 2023-03-05 NOTE — Assessment & Plan Note (Signed)
SNF [facility] for supportive care.  MMSE 15/30 11/28/22, under Hospice care.

## 2023-03-05 NOTE — Assessment & Plan Note (Signed)
,   takes Furosemide, cardiology, severe aortic valve stenosis. Echocardiogram 11/15/20 EF 60-65%. Bun/creat 24/1.0 01/01/23

## 2023-03-05 NOTE — Assessment & Plan Note (Signed)
takes Levothyroxine TSH 2.21 09/25/22 

## 2023-03-05 NOTE — Assessment & Plan Note (Signed)
stable on MiraPex, Sinemet hs, better sleep at night.  

## 2023-03-05 NOTE — Assessment & Plan Note (Signed)
Stable, takes Senokot S, Amitiza, Colace, MiraLax  

## 2023-03-05 NOTE — Assessment & Plan Note (Signed)
stabilizing,  takes Sertraline,  Quetiapine. Change Tramadol to prn helped. TSH 2.21 09/25/22 

## 2023-03-05 NOTE — Assessment & Plan Note (Signed)
takes Pantoprazole, Famotidine, yearly FOBT per GI, Hgb 9.0 01/01/23

## 2023-03-11 NOTE — Assessment & Plan Note (Signed)
03/06/23 urine culture Klebsiella pneumoniae, variicola, Cipro 500mg  bid x7 days.

## 2023-03-14 ENCOUNTER — Ambulatory Visit: Payer: Medicare Other | Admitting: Neurology

## 2023-04-10 ENCOUNTER — Encounter: Payer: Self-pay | Admitting: Nurse Practitioner

## 2023-04-10 ENCOUNTER — Non-Acute Institutional Stay (SKILLED_NURSING_FACILITY): Payer: Medicare Other | Admitting: Nurse Practitioner

## 2023-04-10 DIAGNOSIS — D509 Iron deficiency anemia, unspecified: Secondary | ICD-10-CM

## 2023-04-10 DIAGNOSIS — E039 Hypothyroidism, unspecified: Secondary | ICD-10-CM

## 2023-04-10 DIAGNOSIS — I5032 Chronic diastolic (congestive) heart failure: Secondary | ICD-10-CM | POA: Diagnosis not present

## 2023-04-10 DIAGNOSIS — K219 Gastro-esophageal reflux disease without esophagitis: Secondary | ICD-10-CM

## 2023-04-10 DIAGNOSIS — I2510 Atherosclerotic heart disease of native coronary artery without angina pectoris: Secondary | ICD-10-CM

## 2023-04-10 DIAGNOSIS — K5909 Other constipation: Secondary | ICD-10-CM

## 2023-04-10 DIAGNOSIS — M154 Erosive (osteo)arthritis: Secondary | ICD-10-CM

## 2023-04-10 DIAGNOSIS — G20A1 Parkinson's disease without dyskinesia, without mention of fluctuations: Secondary | ICD-10-CM

## 2023-04-10 DIAGNOSIS — E7800 Pure hypercholesterolemia, unspecified: Secondary | ICD-10-CM

## 2023-04-10 DIAGNOSIS — F323 Major depressive disorder, single episode, severe with psychotic features: Secondary | ICD-10-CM

## 2023-04-10 DIAGNOSIS — F02B2 Dementia in other diseases classified elsewhere, moderate, with psychotic disturbance: Secondary | ICD-10-CM

## 2023-04-10 DIAGNOSIS — I951 Orthostatic hypotension: Secondary | ICD-10-CM

## 2023-04-10 DIAGNOSIS — N200 Calculus of kidney: Secondary | ICD-10-CM

## 2023-04-10 DIAGNOSIS — G2581 Restless legs syndrome: Secondary | ICD-10-CM

## 2023-04-10 NOTE — Progress Notes (Signed)
Location:   SNF [facility] Room Number: 45 Place of Service:  SNF (31) Provider: Arna Snipe Kiano Terrien NP  Mariesha Venturella X, NP  Patient Care Team: Haeleigh Streiff X, NP as PCP - General (Internal Medicine) Quintella Reichert, MD as PCP - Cardiology (Cardiology) Tia Alert, MD (Neurosurgery) Suanne Marker, MD (Neurology) Carman Ching, MD (Inactive) as Consulting Physician (Gastroenterology) Berneice Heinrich, Delbert Phenix., MD as Consulting Physician (Urology) Antoney Biven X, NP as Nurse Practitioner (Internal Medicine) Tat, Octaviano Batty, DO as Consulting Physician (Neurology)  Extended Emergency Contact Information Primary Emergency Contact: [facility] Address: [address]          [place], [place] [address] Darden [place] of [place] Home Phone: [phone] Mobile Phone: [phone] Relation: Spouse Secondary Emergency Contact: Abbey Chatters Mobile Phone: [phone] Relation: Daughter Preferred language: [place] Interpreter needed? No  Code Status:  DNR Goals of care: Advanced Directive information    03/05/2023    8:32 AM  Advanced Directives  Does Patient Have a Medical Advance Directive? Yes  Type of Estate agent of [place];Living will;Out of facility DNR (pink MOST or yellow form)  Does patient want to make changes to medical advance directive? No - Patient declined  Copy of Healthcare Power of Attorney in Chart? Yes - validated most recent copy scanned in chart (See row information)  Pre-existing out of facility DNR order (yellow form or pink MOST form) Pink MOST/Yellow Form most recent copy in chart - Physician notified to receive inpatient order     Chief Complaint  Patient presents with   Medical Management of Chronic Issues    HPI:  Pt is a 89 y.o. female seen today for medical management of chronic diseases.      Memory loss, progressing, SNF [facility] for supportive care.  MMSE 15/30 11/28/22, under Hospice care.              Dry eyes,  uses artificial tears.  Prolapsed rectum, avoid constipation, usually reduced on its own.              Hx of rena lithiasis, recurrent UTI, urosepsis, treated by ID. 09/19/21 cystoscopy, laser lithotripsy, R ureteral stent/dilatation. Occasional dysuria present.              GERD, takes Pantoprazole, Famotidine, yearly FOBT per GI, Hgb 9.0 01/01/23             Parkinson's, takes Sinemet, MiraPex f/u Dr. Arbutus Leas, w/c for mobility most of time.              Hypotension, on Midodrine.              RLS stable on MiraPex, Sinemet hs, better sleep at night.              Depression/hallucination, stabilizing,  takes Sertraline,  Quetiapine. Change Tramadol to prn helped. TSH 2.21 09/25/22             Constipation, takes Senokot S, Amitiza, Colace, MiraLax              Hypothyroidism, takes Levothyroxine TSH 2.21 09/25/22             Erosive arthritis, aches and pains,  takes Plaquenil, Tylenol  f/u Rheumatology.              CHF/trace edema BLE, takes Furosemide, cardiology, severe aortic valve stenosis. Echocardiogram 11/15/20 EF 60-65%. Bun/creat 24/1.0 01/01/23             Hyperlipidemia, LDL  64 01/11/22, off Rosuvastatin.              Anemia, off Fe, ASA, Hgb 9.0 01/01/23             AS/CAD Cardiology palliative approach. On Midodrine per Cardiology. More noticeable DOE, fatigue. under hospice service.     Past Medical History:  Diagnosis Date   Abnormal nuclear stress test    Actinic cheilitis 04/23/2019   Anemia    iron deficiency    Anxiety    Aortic stenosis    severe by echo 2021    Arthritis    Broken arm    right    Chronic diastolic CHF (congestive heart failure) (HCC)    Chronic pain    Cognitive communication deficit    Cognitive deficits    Communication   Constipation    Depression    Erosive (osteo)arthritis 03/09/2020   Family history of adverse reaction to anesthesia    pt. states sister vomits   Fibromyalgia    GERD (gastroesophageal reflux disease)    H/O hiatal hernia     Hallucinations, visual 07/21/2018   03/10/20 psych consult.    Hematuria 05/18/2020   History of bronchitis    History of kidney stones    Hypercholesterolemia    Hypertension    dr t turner   Hypothyroidism    Memory loss 04/21/2018   08/08/18 CT head no traumatic findings, atrophy.  09/27/19 MMSE 25/30, failed the clock drawing   Microhematuria 03/24/2015   Mild CAD    Neuropathy    Osteoporosis    Parkinson disease    PVD (peripheral vascular disease) (HCC)    99% stenosis of left anteiror tibial artery, mod stenosis of left distal SFA and popliteal artery followed by Dr. Darrick Penna   RBBB    noted on EKG 2018   Restless leg syndrome    Sepsis (HCC)    hx of due to Ecoli   Septic shock (HCC) 04/19/2020   Shortness of breath    with exertion - chronic    Sjogren's disease (HCC)    Sjogren's syndrome (HCC) 02/04/2018   04/16/19 rheumatology: Erosive OA of hands, Sjogrens syndrome, low back pain at multiple sites, recurrent kidney stones, age related osteoporosis w/o current pathological fracture, f/u 6 months.    SOB (shortness of breath)    chronic due to diastolic dysfunction, deconditioning, obesity   Spinal stenosis    lumbar region    Tremor    Unstable gait 04/21/2018   Unsteadiness on feet    Urinary frequency 09/18/2018   Urinary tract infection    Visual hallucinations    Past Surgical History:  Procedure Laterality Date   ABDOMINAL HYSTERECTOMY     BACK SURGERY     4 back surgeries,   lumbar fusion   CARDIAC CATHETERIZATION  2006   normal   CYSTOSCOPY/URETEROSCOPY/HOLMIUM LASER/STENT PLACEMENT Left 01/06/2019   Procedure: CYSTOSCOPY/RETROGRADE/URETEROSCOPY/HOLMIUM LASER/BASKET RETRIEVAL/STENT PLACEMENT;  Surgeon: Rene Paci, MD;  Location: [facility];  Service: Urology;  Laterality: Left;   CYSTOSCOPY/URETEROSCOPY/HOLMIUM LASER/STENT PLACEMENT Bilateral 05/24/2020   Procedure: CYSTOSCOPY/RETROGRADE/URETEROSCOPY/HOLMIUM LASER/STENT PLACEMENT;  Surgeon:  Rene Paci, MD;  Location: [facility];  Service: Urology;  Laterality: Bilateral;   CYSTOSCOPY/URETEROSCOPY/HOLMIUM LASER/STENT PLACEMENT Right 09/27/2021   Procedure: CYSTOSCOPY/BILATERAL RETROGRADE/URETEROSCOPY/HOLMIUM LASER/STENT PLACEMENT;  Surgeon: Rene Paci, MD;  Location: [facility];  Service: Urology;  Laterality: Right;  ONLY NEEDS 60 MIN   EYE SURGERY Bilateral    cateracts   falls  variious fall, broken wrist,and toes   FRACTURE SURGERY Right April 2016   Wrist, Pt. fell   IR NEPHROSTOMY PLACEMENT RIGHT  04/19/2020   kidney stone removal Left 01/20/2019   RIGHT/LEFT HEART CATH AND CORONARY ANGIOGRAPHY N/A 02/05/2018   Procedure: RIGHT/LEFT HEART CATH AND CORONARY ANGIOGRAPHY;  Surgeon: Lennette Bihari, MD;  Location: [facility];  Service: Cardiovascular;  Laterality: N/A;   SPINAL CORD STIMULATOR INSERTION N/A 09/09/2015   Procedure: LUMBAR SPINAL CORD STIMULATOR INSERTION;  Surgeon: Odette Fraction, MD;  Location: [facility];  Service: Neurosurgery;  Laterality: N/A;  LUMBAR SPINAL CORD STIMULATOR INSERTION   SPINE SURGERY  April 2013   Back X's 4   TOTAL HIP ARTHROPLASTY     right   WRIST FRACTURE SURGERY Bilateral     Allergies  Allergen Reactions   Phosphate     Other reaction(s): Unknown   Adrenalone    Codeine Other (See Comments)    Reaction:  Headaches and nightmares  Other reaction(s): Unknown   Lactose Other (See Comments)    abd pain, lactose intolerant   Lactose Intolerance (Gi) Nausea And Vomiting   Latex Rash   Lyrica [Pregabalin] Swelling and Other (See Comments)    Reaction:  Leg swelling   Morphine Other (See Comments)   Other Other (See Comments)    Pt states that pain medications give her nightmares.     Plaquenil [Hydroxychloroquine Sulfate] Other (See Comments)    Reaction:  GI upset    Reglan [Metoclopramide] Other (See Comments)    Reaction:  GI upset    Requip [Ropinirole Hcl] Other (See Comments)     Reaction:  GI upset    Septra [Sulfamethoxazole-Trimethoprim] Nausea And Vomiting   Shellfish Allergy Nausea And Vomiting   Sulfa Antibiotics Other (See Comments)    Headache, very sick    Allergies as of 04/10/2023       Reactions   Phosphate    Other reaction(s): Unknown   Adrenalone    Codeine Other (See Comments)   Reaction:  Headaches and nightmares  Other reaction(s): Unknown   Lactose Other (See Comments)   abd pain, lactose intolerant   Lactose Intolerance (gi) Nausea And Vomiting   Latex Rash   Lyrica [pregabalin] Swelling, Other (See Comments)   Reaction:  Leg swelling   Morphine Other (See Comments)   Other Other (See Comments)   Pt states that pain medications give her nightmares.     Plaquenil [hydroxychloroquine Sulfate] Other (See Comments)   Reaction:  GI upset    Reglan [metoclopramide] Other (See Comments)   Reaction:  GI upset    Requip [ropinirole Hcl] Other (See Comments)   Reaction:  GI upset    Septra [sulfamethoxazole-trimethoprim] Nausea And Vomiting   Shellfish Allergy Nausea And Vomiting   Sulfa Antibiotics Other (See Comments)   Headache, very sick        Medication List        Accurate as of Apr 10, 2023 11:59 PM. If you have any questions, ask your nurse or doctor.          carbidopa-levodopa 25-100 MG tablet Commonly known as: SINEMET IR Take 1 tablet by mouth 3 (three) times daily.   Carbidopa-Levodopa ER 25-100 MG tablet controlled release Commonly known as: SINEMET CR Take 1 tablet by mouth daily.   docusate sodium 100 MG capsule Commonly known as: COLACE Take 100 mg by mouth at bedtime.   famotidine 20 MG tablet Commonly known as:  PEPCID Take 20 mg by mouth daily.   furosemide 20 MG tablet Commonly known as: LASIX Take 1 tablet (20 mg total) by mouth daily.   hydroxychloroquine 200 MG tablet Commonly known as: PLAQUENIL Take 200 mg by mouth daily.   lactose free nutrition Liqd Take by mouth 2 (two) times daily.  1/2 box; Resident likes chocolate. Twice A Day   levothyroxine 50 MCG tablet Commonly known as: SYNTHROID Take 50 mcg by mouth daily before breakfast.   lubiprostone 24 MCG capsule Commonly known as: AMITIZA Take 24 mcg by mouth 2 (two) times daily with a meal.   midodrine 5 MG tablet Commonly known as: PROAMATINE Take 1 tablet (5 mg total) by mouth 3 (three) times daily with meals.   nystatin powder Generic drug: nystatin Apply 1 Application topically daily. ABDOMINAL SKIN FOLD, UNDER BREAST AND GROIN   pantoprazole 40 MG tablet Commonly known as: PROTONIX Take 40 mg by mouth daily.   polyethylene glycol 17 g packet Commonly known as: MIRALAX / GLYCOLAX Take 17 g by mouth daily.   potassium citrate 10 MEQ (1080 MG) SR tablet Commonly known as: UROCIT-K Take 10 mEq by mouth 2 (two) times daily.   pramipexole 0.125 MG tablet Commonly known as: MIRAPEX Take 0.125 mg by mouth at bedtime.   PRESERVISION AREDS 2 PO Take 1 tablet by mouth daily.   QUEtiapine 25 MG tablet Commonly known as: SEROQUEL Take 1 tablet (25 mg total) by mouth 2 (two) times daily.   senna-docusate 8.6-50 MG tablet Commonly known as: Senokot-S Take 1 tablet by mouth at bedtime.   Sertraline HCl 150 MG Caps Take 125 mg by mouth at bedtime.   Systane 0.4-0.3 % Soln Generic drug: Polyethyl Glycol-Propyl Glycol Apply 1 drop to eye 2 (two) times daily as needed (dry eyes).        Review of Systems  Unable to perform ROS: Dementia    Immunization History  Administered Date(s) Administered   Hepatitis A, Adult 03/01/1997, 09/20/1997   Influenza Split 09/09/2013, 09/27/2014   Influenza, High Dose Seasonal PF 08/01/2011, 08/27/2012, 10/14/2013, 09/08/2015, 09/10/2016, 09/12/2017, 09/12/2018, 09/15/2019, 10/03/2022   Influenza,inj,Quad PF,6+ Mos 09/11/2016   Influenza-Unspecified 09/21/2020, 09/28/2021, 10/03/2022   Moderna SARS-COV2 Booster Vaccination 04/27/2022, 10/11/2022   Moderna  Sars-Covid-2 Vaccination 12/12/2019, 01/09/2020, 10/18/2020, 05/09/2021, 08/29/2021   Pneumococcal Conjugate-13 04/15/2014   Pneumococcal Polysaccharide-23 09/28/2005   Td 06/20/2016   Tdap 04/29/2006, 09/01/2015   Zoster Recombinat (Shingrix) 06/11/2018, 09/10/2018   Zoster, Live 04/29/2006   Pertinent  Health Maintenance Due  Topic Date Due   INFLUENZA VACCINE  07/11/2023   DEXA SCAN  Completed      09/25/2022    2:40 PM 09/28/2022   11:43 AM 11/27/2022   10:40 AM 01/22/2023   11:45 AM 03/05/2023    8:29 AM  Fall Risk  Falls in the past year? 0 0 0 0 0  Was there an injury with Fall?  0 0 0 0  Fall Risk Category Calculator  0 0 0 0  Fall Risk Category (Retired)  Low Low    (RETIRED) Patient Fall Risk Level   Low fall risk    Patient at Risk for Falls Due to  History of fall(s) History of fall(s) History of fall(s) History of fall(s)  Fall risk Follow up  Falls evaluation completed Falls evaluation completed Falls evaluation completed Falls evaluation completed   Functional Status Survey:    Vitals:   04/10/23 1412  BP: 116/76  Pulse: 81  Resp: 18  Temp: 98.1 F (36.7 C)  SpO2: 95%  Weight: 119 lb 3.2 oz (54.1 kg)   Body mass index is 26.72 kg/m. Physical Exam Vitals and nursing note reviewed.  Constitutional:      Appearance: Normal appearance.  HENT:     Head: Normocephalic and atraumatic.     Nose: Nose normal.     Mouth/Throat:     Mouth: Mucous membranes are moist.  Eyes:     Extraocular Movements: Extraocular movements intact.     Conjunctiva/sclera: Conjunctivae normal.     Pupils: Pupils are equal, round, and reactive to light.  Cardiovascular:     Rate and Rhythm: Normal rate and regular rhythm.     Heart sounds: Murmur heard.  Pulmonary:     Effort: Pulmonary effort is normal.     Breath sounds: No rales.  Abdominal:     General: Bowel sounds are normal.     Palpations: Abdomen is soft.     Tenderness: There is no abdominal tenderness. There  is no right CVA tenderness, left CVA tenderness, guarding or rebound.     Comments: Suprapubic region discomfort when palpated.   Genitourinary:    Comments: Hx of reported protruded rectum was reduced on its own, a external hemorrhoid at 7pm, no s/s of injury.  Musculoskeletal:        General: Tenderness present.     Cervical back: Normal range of motion and neck supple.     Right lower leg: Edema present.     Left lower leg: Edema present.     Comments: Trace edema BLE.  Arthritic changes in fingers, R>L. The R 3rd finger mid knuckle swelling, no heat or redness. Chronic lower back pain.   Skin:    General: Skin is warm and dry.  Neurological:     General: No focal deficit present.     Mental Status: She is alert. Mental status is at baseline.     Gait: Gait abnormal.     Comments: Oriented to person, place. Ambulates with walker.   Psychiatric:     Comments: Visual hallucination vs delusion is chronic, improved paranoia and anxiety.      Labs reviewed: Recent Labs    09/25/22 0000 01/01/23 0630  NA 143 140  K 4.4 4.3  CL 107 107  CO2 28* 25*  BUN 17 24*  CREATININE 1.0 1.0  CALCIUM 10.1 9.3   Recent Labs    09/25/22 0000 01/01/23 0630  AST 24 23  ALT 14 13  ALKPHOS 76 67  ALBUMIN 4.2 3.7   Recent Labs    09/25/22 0000 12/13/22 0630 01/01/23 0630  WBC 6.7 6.5 7.9  NEUTROABS  --  3,569.00 5,246.00  HGB 11.4* 10.6* 9.0*  HCT 35* 32* 27*  PLT 288 259 225   Lab Results  Component Value Date   TSH 2.21 09/25/2022   No results found for: "HGBA1C" Lab Results  Component Value Date   CHOL 137 01/11/2022   HDL 47 01/11/2022   LDLCALC 64 01/11/2022   TRIG 184 (A) 01/11/2022   CHOLHDL 2.4 07/14/2019    Significant Diagnostic Results in last 30 days:  No results found.  Assessment/Plan  Hypothyroidism takes Levothyroxine TSH 2.21 09/25/22  Erosive (osteo)arthritis aches and pains,  takes Plaquenil, Tylenol  f/u Rheumatology.   (HFpEF) heart  failure with preserved ejection fraction (HCC) trace edema BLE, takes Furosemide, cardiology, severe aortic valve stenosis. Echocardiogram 11/15/20 EF 60-65%. Bun/creat 24/1.0 01/01/23  Hypercholesterolemia  LDL 64 01/11/22, off Rosuvastatin.   Anemia off Fe, ASA, Hgb 9.0 01/01/23  Coronary artery disease involving native coronary artery of native heart without angina pectoris  AS/CAD Cardiology palliative approach. On Midodrine per Cardiology. More noticeable DOE, fatigue. under hospice service.   Chronic constipation Stable, takes Senokot S, Amitiza, Colace, MiraLax   Depression, psychotic (HCC) stabilizing,  takes Sertraline,  Quetiapine. Change Tramadol to prn helped. TSH 2.21 09/25/22  Restless leg syndrome stable on MiraPex, Sinemet hs, better sleep at night.   Orthostasis Blood pressure is maintained, on Midodrine.   Parkinson's disease dementia (HCC)  takes Sinemet, MiraPex f/u Dr. Arbutus Leas, w/c for mobility most of time, progressing, SNF [facility] for supportive care.  MMSE 15/30 11/28/22, under Hospice care.   Renal lithiasis Hx of rena lithiasis, recurrent UTI, urosepsis, treated by ID. 09/19/21 cystoscopy, laser lithotripsy, R ureteral stent/dilatation. Occasional dysuria present.    Family/ staff Communication: plan of care reviewed with the patient and charge nurse.   Labs/tests ordered:  none  Time spend 35 minutes.

## 2023-04-11 ENCOUNTER — Telehealth: Payer: Self-pay | Admitting: Podiatry

## 2023-04-11 NOTE — Assessment & Plan Note (Signed)
off Fe, ASA, Hgb 9.0 01/01/23

## 2023-04-11 NOTE — Assessment & Plan Note (Signed)
Stable, takes Pantoprazole, Famotidine, yearly FOBT per GI, Hgb 9.0 01/01/23 

## 2023-04-11 NOTE — Assessment & Plan Note (Signed)
Stable, takes Senokot S, Amitiza, Colace, MiraLax  

## 2023-04-11 NOTE — Assessment & Plan Note (Signed)
aches and pains,  takes Plaquenil, Tylenol  f/u Rheumatology.  

## 2023-04-11 NOTE — Telephone Encounter (Signed)
Pts husband left message that pts appt was to have been canceled a while ago.   I returned call and was not able to leave a message will call back later and confirm cancellation.

## 2023-04-11 NOTE — Assessment & Plan Note (Signed)
stable on MiraPex, Sinemet hs, better sleep at night.  

## 2023-04-11 NOTE — Assessment & Plan Note (Signed)
AS/CAD Cardiology palliative approach. On Midodrine per Cardiology. More noticeable DOE, fatigue. under hospice service.

## 2023-04-11 NOTE — Assessment & Plan Note (Signed)
stabilizing,  takes Sertraline,  Quetiapine. Change Tramadol to prn helped. TSH 2.21 09/25/22 

## 2023-04-11 NOTE — Assessment & Plan Note (Signed)
Blood pressure is maintained, on Midodrine  

## 2023-04-11 NOTE — Assessment & Plan Note (Signed)
takes Levothyroxine TSH 2.21 09/25/22 

## 2023-04-11 NOTE — Assessment & Plan Note (Signed)
trace edema BLE, takes Furosemide, cardiology, severe aortic valve stenosis. Echocardiogram 11/15/20 EF 60-65%. Bun/creat 24/1.0 01/01/23

## 2023-04-11 NOTE — Assessment & Plan Note (Signed)
Hx of rena lithiasis, recurrent UTI, urosepsis, treated by ID. 09/19/21 cystoscopy, laser lithotripsy, R ureteral stent/dilatation. Occasional dysuria present. ?

## 2023-04-11 NOTE — Assessment & Plan Note (Signed)
LDL 64 01/11/22, off Rosuvastatin.  

## 2023-04-11 NOTE — Assessment & Plan Note (Signed)
takes Sinemet, MiraPex f/u Dr. Arbutus Leas, w/c for mobility most of time, progressing, SNF [facility] for supportive care.  MMSE 15/30 11/28/22, under Hospice care.

## 2023-04-15 ENCOUNTER — Ambulatory Visit: Payer: Medicare Other | Admitting: Podiatry

## 2023-04-19 ENCOUNTER — Non-Acute Institutional Stay (SKILLED_NURSING_FACILITY): Payer: Medicare Other | Admitting: Nurse Practitioner

## 2023-04-19 ENCOUNTER — Encounter: Payer: Self-pay | Admitting: Nurse Practitioner

## 2023-04-19 DIAGNOSIS — G20A1 Parkinson's disease without dyskinesia, without mention of fluctuations: Secondary | ICD-10-CM

## 2023-04-19 DIAGNOSIS — I5032 Chronic diastolic (congestive) heart failure: Secondary | ICD-10-CM

## 2023-04-19 DIAGNOSIS — K219 Gastro-esophageal reflux disease without esophagitis: Secondary | ICD-10-CM | POA: Diagnosis not present

## 2023-04-19 DIAGNOSIS — I350 Nonrheumatic aortic (valve) stenosis: Secondary | ICD-10-CM

## 2023-04-19 DIAGNOSIS — G2581 Restless legs syndrome: Secondary | ICD-10-CM

## 2023-04-19 DIAGNOSIS — D509 Iron deficiency anemia, unspecified: Secondary | ICD-10-CM

## 2023-04-19 DIAGNOSIS — R300 Dysuria: Secondary | ICD-10-CM

## 2023-04-19 DIAGNOSIS — E7800 Pure hypercholesterolemia, unspecified: Secondary | ICD-10-CM

## 2023-04-19 DIAGNOSIS — N200 Calculus of kidney: Secondary | ICD-10-CM

## 2023-04-19 DIAGNOSIS — F323 Major depressive disorder, single episode, severe with psychotic features: Secondary | ICD-10-CM

## 2023-04-19 DIAGNOSIS — K5909 Other constipation: Secondary | ICD-10-CM

## 2023-04-19 DIAGNOSIS — F02B2 Dementia in other diseases classified elsewhere, moderate, with psychotic disturbance: Secondary | ICD-10-CM

## 2023-04-19 DIAGNOSIS — I951 Orthostatic hypotension: Secondary | ICD-10-CM

## 2023-04-19 DIAGNOSIS — M154 Erosive (osteo)arthritis: Secondary | ICD-10-CM

## 2023-04-19 NOTE — Assessment & Plan Note (Signed)
takes Sinemet, MiraPex f/u Dr. Tat, w/c for mobility most of time.  

## 2023-04-19 NOTE — Assessment & Plan Note (Signed)
takes Furosemide, cardiology, severe aortic valve stenosis. Echocardiogram 11/15/20 EF 60-65%. Bun/creat 24/1.0 01/01/23

## 2023-04-19 NOTE — Assessment & Plan Note (Signed)
off Fe, ASA, Hgb 9.0 01/01/23 

## 2023-04-19 NOTE — Assessment & Plan Note (Signed)
aches and pains,  takes Plaquenil, Tylenol  f/u Rheumatology.  

## 2023-04-19 NOTE — Assessment & Plan Note (Signed)
burning sensation upon urination and supra pubic discomfort palpated, afebrile, no noted blood in urine, nausea, vomiting, or constipation. The patient is in her usual state of health.  Encourage oral fluid intake, obtain UA C/S

## 2023-04-19 NOTE — Assessment & Plan Note (Signed)
Hx of rena lithiasis, recurrent UTI, urosepsis, treated by ID. 09/19/21 cystoscopy, laser lithotripsy, R ureteral stent/dilatation. Occasional dysuria present. ?

## 2023-04-19 NOTE — Assessment & Plan Note (Signed)
LDL 64 01/11/22, off Rosuvastatin.  

## 2023-04-19 NOTE — Assessment & Plan Note (Signed)
Blood pressure is maintained, on Midodrine  

## 2023-04-19 NOTE — Assessment & Plan Note (Signed)
AS/CAD Cardiology palliative approach. On Midodrine per Cardiology. More noticeable DOE, fatigue. under hospice service.  

## 2023-04-19 NOTE — Assessment & Plan Note (Signed)
takes Levothyroxine TSH 2.21 09/25/22 

## 2023-04-19 NOTE — Assessment & Plan Note (Signed)
Memory loss, progressing, SNF [facility] for supportive care.  MMSE 15/30 11/28/22, under Hospice care.

## 2023-04-19 NOTE — Assessment & Plan Note (Signed)
stable on MiraPex, Sinemet hs, better sleep at night.  

## 2023-04-19 NOTE — Assessment & Plan Note (Signed)
Depression/hallucination, stabilizing,  takes Sertraline,  Quetiapine. Change Tramadol to prn helped. TSH 2.21 09/25/22 

## 2023-04-19 NOTE — Progress Notes (Signed)
Location:  [facility] Room Number: NO/45/A Place of Service:  SNF (31) Provider:  Enslee Bibbins X, NP  Patient Care Team: Rulon Abdalla X, NP as PCP - General (Internal Medicine) Quintella Reichert, MD as PCP - Cardiology (Cardiology) Tia Alert, MD (Neurosurgery) Suanne Marker, MD (Neurology) Carman Ching, MD (Inactive) as Consulting Physician (Gastroenterology) Berneice Heinrich, Delbert Phenix., MD as Consulting Physician (Urology) Marcellous Snarski X, NP as Nurse Practitioner (Internal Medicine) Tat, Octaviano Batty, DO as Consulting Physician (Neurology)  Extended Emergency Contact Information Primary Emergency Contact: [facility] Address: [address]          [place], [place] [address] D[place] of [place] Home Phone: [phone] Mobile Phone: [phone] Relation: Spouse Secondary Emergency Contact: Abbey Chatters Mobile Phone: [phone] Relation: Daughter Preferred language: [place] Interpreter needed? No  Code Status:  DNR Goals of care: Advanced Directive information    03/05/2023    8:32 AM  Advanced Directives  Does Patient Have a Medical Advance Directive? Yes  Type of Estate agent of [place];Living will;Out of facility DNR (pink MOST or yellow form)  Does patient want to make changes to medical advance directive? No - Patient declined  Copy of Healthcare Power of Attorney in Chart? Yes - validated most recent copy scanned in chart (See row information)  Pre-existing out of facility DNR order (yellow form or pink MOST form) Pink MOST/Yellow Form most recent copy in chart - Physician notified to receive inpatient order     Chief Complaint  Patient presents with   Acute Visit    Patient is being seen for burning sensation upon urinating    HPI:  Pt is a 89 y.o. female seen today for an acute visit for burning sensation upon urination and supra pubic discomfort palpated, afebrile, no noted blood in urine, nausea,  vomiting, or constipation. The patient is in her usual state of health.    Memory loss, progressing, SNF [facility] for supportive care.  MMSE 15/30 11/28/22, under Hospice care.              Dry eyes, uses artificial tears.  Prolapsed rectum, avoid constipation, usually reduced on its own.              Hx of rena lithiasis, recurrent UTI, urosepsis, treated by ID. 09/19/21 cystoscopy, laser lithotripsy, R ureteral stent/dilatation. Occasional dysuria present.              GERD, takes Pantoprazole, Famotidine, yearly FOBT per GI, Hgb 9.0 01/01/23             Parkinson's, takes Sinemet, MiraPex f/u Dr. Arbutus Leas, w/c for mobility most of time.              Hypotension, on Midodrine.              RLS stable on MiraPex, Sinemet hs, better sleep at night.              Depression/hallucination, stabilizing,  takes Sertraline,  Quetiapine. Change Tramadol to prn helped. TSH 2.21 09/25/22             Constipation, takes Senokot S, Amitiza, Colace, MiraLax              Hypothyroidism, takes Levothyroxine TSH 2.21 09/25/22             Erosive arthritis, aches and pains,  takes Plaquenil, Tylenol  f/u Rheumatology.  CHF/trace edema BLE, takes Furosemide, cardiology, severe aortic valve stenosis. Echocardiogram 11/15/20 EF 60-65%. Bun/creat 24/1.0 01/01/23             Hyperlipidemia, LDL 64 01/11/22, off Rosuvastatin.              Anemia, off Fe, ASA, Hgb 9.0 01/01/23             AS/CAD Cardiology palliative approach. On Midodrine per Cardiology. More noticeable DOE, fatigue. under hospice service.       Past Medical History:  Diagnosis Date   Abnormal nuclear stress test    Actinic cheilitis 04/23/2019   Anemia    iron deficiency    Anxiety    Aortic stenosis    severe by echo 2021    Arthritis    Broken arm    right    Chronic diastolic CHF (congestive heart failure) (HCC)    Chronic pain    Cognitive communication deficit    Cognitive deficits    Communication   Constipation    Depression     Erosive (osteo)arthritis 03/09/2020   Family history of adverse reaction to anesthesia    pt. states sister vomits   Fibromyalgia    GERD (gastroesophageal reflux disease)    H/O hiatal hernia    Hallucinations, visual 07/21/2018   03/10/20 psych consult.    Hematuria 05/18/2020   History of bronchitis    History of kidney stones    Hypercholesterolemia    Hypertension    dr t turner   Hypothyroidism    Memory loss 04/21/2018   08/08/18 CT head no traumatic findings, atrophy.  09/27/19 MMSE 25/30, failed the clock drawing   Microhematuria 03/24/2015   Mild CAD    Neuropathy    Osteoporosis    Parkinson disease    PVD (peripheral vascular disease) (HCC)    99% stenosis of left anteiror tibial artery, mod stenosis of left distal SFA and popliteal artery followed by Dr. Darrick Penna   RBBB    noted on EKG 2018   Restless leg syndrome    Sepsis (HCC)    hx of due to Ecoli   Septic shock (HCC) 04/19/2020   Shortness of breath    with exertion - chronic    Sjogren's disease (HCC)    Sjogren's syndrome (HCC) 02/04/2018   04/16/19 rheumatology: Erosive OA of hands, Sjogrens syndrome, low back pain at multiple sites, recurrent kidney stones, age related osteoporosis w/o current pathological fracture, f/u 6 months.    SOB (shortness of breath)    chronic due to diastolic dysfunction, deconditioning, obesity   Spinal stenosis    lumbar region    Tremor    Unstable gait 04/21/2018   Unsteadiness on feet    Urinary frequency 09/18/2018   Urinary tract infection    Visual hallucinations    Past Surgical History:  Procedure Laterality Date   ABDOMINAL HYSTERECTOMY     BACK SURGERY     4 back surgeries,   lumbar fusion   CARDIAC CATHETERIZATION  2006   normal   CYSTOSCOPY/URETEROSCOPY/HOLMIUM LASER/STENT PLACEMENT Left 01/06/2019   Procedure: CYSTOSCOPY/RETROGRADE/URETEROSCOPY/HOLMIUM LASER/BASKET RETRIEVAL/STENT PLACEMENT;  Surgeon: Rene Paci, MD;  Location: [facility];   Service: Urology;  Laterality: Left;   CYSTOSCOPY/URETEROSCOPY/HOLMIUM LASER/STENT PLACEMENT Bilateral 05/24/2020   Procedure: CYSTOSCOPY/RETROGRADE/URETEROSCOPY/HOLMIUM LASER/STENT PLACEMENT;  Surgeon: Rene Paci, MD;  Location: [facility];  Service: Urology;  Laterality: Bilateral;   CYSTOSCOPY/URETEROSCOPY/HOLMIUM LASER/STENT PLACEMENT Right 09/27/2021   Procedure: CYSTOSCOPY/BILATERAL RETROGRADE/URETEROSCOPY/HOLMIUM LASER/STENT PLACEMENT;  Surgeon:  Rene Paci, MD;  Location: [facility];  Service: Urology;  Laterality: Right;  ONLY NEEDS 60 MIN   EYE SURGERY Bilateral    cateracts   falls     variious fall, broken wrist,and toes   FRACTURE SURGERY Right April 2016   Wrist, Pt. fell   IR NEPHROSTOMY PLACEMENT RIGHT  04/19/2020   kidney stone removal Left 01/20/2019   RIGHT/LEFT HEART CATH AND CORONARY ANGIOGRAPHY N/A 02/05/2018   Procedure: RIGHT/LEFT HEART CATH AND CORONARY ANGIOGRAPHY;  Surgeon: Lennette Bihari, MD;  Location: [facility];  Service: Cardiovascular;  Laterality: N/A;   SPINAL CORD STIMULATOR INSERTION N/A 09/09/2015   Procedure: LUMBAR SPINAL CORD STIMULATOR INSERTION;  Surgeon: Odette Fraction, MD;  Location: [facility];  Service: Neurosurgery;  Laterality: N/A;  LUMBAR SPINAL CORD STIMULATOR INSERTION   SPINE SURGERY  April 2013   Back X's 4   TOTAL HIP ARTHROPLASTY     right   WRIST FRACTURE SURGERY Bilateral     Allergies  Allergen Reactions   Phosphate     Other reaction(s): Unknown   Adrenalone    Codeine Other (See Comments)    Reaction:  Headaches and nightmares  Other reaction(s): Unknown   Lactose Other (See Comments)    abd pain, lactose intolerant   Lactose Intolerance (Gi) Nausea And Vomiting   Latex Rash   Lyrica [Pregabalin] Swelling and Other (See Comments)    Reaction:  Leg swelling   Morphine Other (See Comments)   Other Other (See Comments)    Pt states that pain medications give her nightmares.     Plaquenil  [Hydroxychloroquine Sulfate] Other (See Comments)    Reaction:  GI upset    Reglan [Metoclopramide] Other (See Comments)    Reaction:  GI upset    Requip [Ropinirole Hcl] Other (See Comments)    Reaction:  GI upset    Septra [Sulfamethoxazole-Trimethoprim] Nausea And Vomiting   Shellfish Allergy Nausea And Vomiting   Sulfa Antibiotics Other (See Comments)    Headache, very sick    Outpatient Encounter Medications as of 04/19/2023  Medication Sig   carbidopa-levodopa (SINEMET IR) 25-100 MG tablet Take 1 tablet by mouth 3 (three) times daily.   Carbidopa-Levodopa ER (SINEMET CR) 25-100 MG tablet controlled release Take 1 tablet by mouth daily.   docusate sodium (COLACE) 100 MG capsule Take 100 mg by mouth at bedtime.   famotidine (PEPCID) 20 MG tablet Take 20 mg by mouth daily.   furosemide (LASIX) 20 MG tablet Take 1 tablet (20 mg total) by mouth daily.   hydroxychloroquine (PLAQUENIL) 200 MG tablet Take 200 mg by mouth daily.   lactose free nutrition (BOOST) LIQD Take by mouth 2 (two) times daily. 1/2 box; Resident likes chocolate. Twice A Day   levothyroxine (SYNTHROID, LEVOTHROID) 50 MCG tablet Take 50 mcg by mouth daily before breakfast.    lubiprostone (AMITIZA) 24 MCG capsule Take 24 mcg by mouth 2 (two) times daily with a meal.    midodrine (PROAMATINE) 5 MG tablet Take 1 tablet (5 mg total) by mouth 3 (three) times daily with meals.   Multiple Vitamins-Minerals (PRESERVISION AREDS 2 PO) Take 1 tablet by mouth daily.   nystatin powder Apply 1 Application topically daily. ABDOMINAL SKIN FOLD, UNDER BREAST AND GROIN   pantoprazole (PROTONIX) 40 MG tablet Take 40 mg by mouth daily.   Polyethyl Glycol-Propyl Glycol (SYSTANE) 0.4-0.3 % SOLN Apply 1 drop to eye 2 (two) times daily as needed (dry eyes).  polyethylene glycol (MIRALAX / GLYCOLAX) 17 g packet Take 17 g by mouth daily.   potassium citrate (UROCIT-K) 10 MEQ (1080 MG) SR tablet Take 10 mEq by mouth 2 (two) times daily.    pramipexole (MIRAPEX) 0.125 MG tablet Take 0.125 mg by mouth at bedtime.   QUEtiapine (SEROQUEL) 25 MG tablet Take 1 tablet (25 mg total) by mouth 2 (two) times daily.   senna-docusate (SENOKOT-S) 8.6-50 MG tablet Take 1 tablet by mouth at bedtime.    Sertraline HCl 150 MG CAPS Take 125 mg by mouth at bedtime.   No facility-administered encounter medications on file as of 04/19/2023.    Review of Systems  Unable to perform ROS: Dementia    Immunization History  Administered Date(s) Administered   Hepatitis A, Adult 03/01/1997, 09/20/1997   Influenza Split 09/09/2013, 09/27/2014   Influenza, High Dose Seasonal PF 08/01/2011, 08/27/2012, 10/14/2013, 09/08/2015, 09/10/2016, 09/12/2017, 09/12/2018, 09/15/2019, 10/03/2022   Influenza,inj,Quad PF,6+ Mos 09/11/2016   Influenza-Unspecified 09/21/2020, 09/28/2021, 10/03/2022   Moderna SARS-COV2 Booster Vaccination 04/27/2022, 10/11/2022   Moderna Sars-Covid-2 Vaccination 12/12/2019, 01/09/2020, 10/18/2020, 05/09/2021, 08/29/2021   Pneumococcal Conjugate-13 04/15/2014   Pneumococcal Polysaccharide-23 09/28/2005   Td 06/20/2016   Tdap 04/29/2006, 09/01/2015   Zoster Recombinat (Shingrix) 06/11/2018, 09/10/2018   Zoster, Live 04/29/2006   Pertinent  Health Maintenance Due  Topic Date Due   INFLUENZA VACCINE  07/11/2023   DEXA SCAN  Completed      09/25/2022    2:40 PM 09/28/2022   11:43 AM 11/27/2022   10:40 AM 01/22/2023   11:45 AM 03/05/2023    8:29 AM  Fall Risk  Falls in the past year? 0 0 0 0 0  Was there an injury with Fall?  0 0 0 0  Fall Risk Category Calculator  0 0 0 0  Fall Risk Category (Retired)  Low Low    (RETIRED) Patient Fall Risk Level   Low fall risk    Patient at Risk for Falls Due to  History of fall(s) History of fall(s) History of fall(s) History of fall(s)  Fall risk Follow up  Falls evaluation completed Falls evaluation completed Falls evaluation completed Falls evaluation completed   Functional Status  Survey:    Vitals:   04/19/23 1025  BP: 120/72  Pulse: 73  Resp: 16  Temp: (!) 97.3 F (36.3 C)  SpO2: 95%  Weight: 116 lb 12.8 oz (53 kg)  Height: 4\' 8"  (1.422 m)   Body mass index is 26.19 kg/m. Physical Exam Vitals and nursing note reviewed.  Constitutional:      Appearance: Normal appearance.  HENT:     Head: Normocephalic and atraumatic.     Nose: Nose normal.     Mouth/Throat:     Mouth: Mucous membranes are moist.  Eyes:     Extraocular Movements: Extraocular movements intact.     Conjunctiva/sclera: Conjunctivae normal.     Pupils: Pupils are equal, round, and reactive to light.  Cardiovascular:     Rate and Rhythm: Normal rate and regular rhythm.     Heart sounds: Murmur heard.  Pulmonary:     Effort: Pulmonary effort is normal.     Breath sounds: No rales.  Abdominal:     General: Bowel sounds are normal.     Palpations: Abdomen is soft.     Tenderness: There is abdominal tenderness. There is no right CVA tenderness, left CVA tenderness, guarding or rebound.     Comments: Suprapubic region discomfort when palpated.   Genitourinary:  Comments: Hx of reported protruded rectum was reduced on its own, a external hemorrhoid at 7pm, no s/s of injury.  C/o burning sensation upon urination, poor historian.  Musculoskeletal:        General: Tenderness present.     Cervical back: Normal range of motion and neck supple.     Right lower leg: Edema present.     Left lower leg: Edema present.     Comments: Trace edema BLE.  Arthritic changes in fingers, R>L. The R 3rd finger mid knuckle swelling, no heat or redness. Chronic lower back pain.   Skin:    General: Skin is warm and dry.  Neurological:     General: No focal deficit present.     Mental Status: She is alert. Mental status is at baseline.     Gait: Gait abnormal.     Comments: Oriented to person, place. Ambulates with walker.   Psychiatric:     Comments: Visual hallucination vs delusion is chronic,  improved paranoia and anxiety.      Labs reviewed: Recent Labs    09/25/22 0000 01/01/23 0630  NA 143 140  K 4.4 4.3  CL 107 107  CO2 28* 25*  BUN 17 24*  CREATININE 1.0 1.0  CALCIUM 10.1 9.3   Recent Labs    09/25/22 0000 01/01/23 0630  AST 24 23  ALT 14 13  ALKPHOS 76 67  ALBUMIN 4.2 3.7   Recent Labs    09/25/22 0000 12/13/22 0630 01/01/23 0630  WBC 6.7 6.5 7.9  NEUTROABS  --  3,569.00 5,246.00  HGB 11.4* 10.6* 9.0*  HCT 35* 32* 27*  PLT 288 259 225   Lab Results  Component Value Date   TSH 2.21 09/25/2022   No results found for: "HGBA1C" Lab Results  Component Value Date   CHOL 137 01/11/2022   HDL 47 01/11/2022   LDLCALC 64 01/11/2022   TRIG 184 (A) 01/11/2022   CHOLHDL 2.4 07/14/2019    Significant Diagnostic Results in last 30 days:  No results found.  Assessment/Plan Dysuria burning sensation upon urination and supra pubic discomfort palpated, afebrile, no noted blood in urine, nausea, vomiting, or constipation. The patient is in her usual state of health.  Encourage oral fluid intake, obtain UA C/S  Parkinson's disease dementia (HCC) Memory loss, progressing, SNF [facility] for supportive care.  MMSE 15/30 11/28/22, under Hospice care.  Renal lithiasis  Hx of rena lithiasis, recurrent UTI, urosepsis, treated by ID. 09/19/21 cystoscopy, laser lithotripsy, R ureteral stent/dilatation. Occasional dysuria present.   GERD (gastroesophageal reflux disease)  takes Pantoprazole, Famotidine, yearly FOBT per GI, Hgb 9.0 01/01/23  Parkinson's disease (HCC)  takes Sinemet, MiraPex f/u Dr. Arbutus Leas, w/c for mobility most of time.   Orthostasis Blood pressure is maintained, on Midodrine.   Restless leg syndrome stable on MiraPex, Sinemet hs, better sleep at night.   Depression, psychotic (HCC) Depression/hallucination, stabilizing,  takes Sertraline,  Quetiapine. Change Tramadol to prn helped. TSH 2.21 09/25/22  Chronic constipation takes Senokot S,  Amitiza, Colace, MiraLax   Hypothyroidism takes Levothyroxine TSH 2.21 09/25/22  Erosive (osteo)arthritis  aches and pains,  takes Plaquenil, Tylenol  f/u Rheumatology.   (HFpEF) heart failure with preserved ejection fraction (HCC)  takes Furosemide, cardiology, severe aortic valve stenosis. Echocardiogram 11/15/20 EF 60-65%. Bun/creat 24/1.0 01/01/23  Hypercholesterolemia LDL 64 01/11/22, off Rosuvastatin.   Anemia  off Fe, ASA, Hgb 9.0 01/01/23     Family/ staff Communication: plan of care reviewed with the patient  and charge nurse.   Labs/tests ordered:  UA C/S  Time spend 35 minutes.

## 2023-04-19 NOTE — Assessment & Plan Note (Signed)
takes Pantoprazole, Famotidine, yearly FOBT per GI, Hgb 9.0 01/01/23 

## 2023-04-19 NOTE — Assessment & Plan Note (Signed)
takes Senokot S, Amitiza, Colace, MiraLax  °

## 2023-05-15 ENCOUNTER — Non-Acute Institutional Stay (SKILLED_NURSING_FACILITY): Admitting: Family Medicine

## 2023-05-15 DIAGNOSIS — R441 Visual hallucinations: Secondary | ICD-10-CM | POA: Diagnosis not present

## 2023-05-15 DIAGNOSIS — I5032 Chronic diastolic (congestive) heart failure: Secondary | ICD-10-CM | POA: Diagnosis not present

## 2023-05-15 DIAGNOSIS — G20A1 Parkinson's disease without dyskinesia, without mention of fluctuations: Secondary | ICD-10-CM

## 2023-05-15 DIAGNOSIS — F323 Major depressive disorder, single episode, severe with psychotic features: Secondary | ICD-10-CM

## 2023-05-15 NOTE — Progress Notes (Addendum)
Provider:  Jacalyn Lefevre, MD Location:      Place of Service:     PCP: [name], Man X, NP Patient Care Team: [name], Man X, NP as PCP - General (Internal Medicine) Quintella Reichert, MD as PCP - Cardiology (Cardiology) Arman Bogus, MD (Neurosurgery) Suanne Marker, MD (Neurology) Carman Ching, MD (Inactive) as Consulting Physician (Gastroenterology) Berneice Heinrich Delbert Phenix., MD as Consulting Physician (Urology) [name], Man X, NP as Nurse Practitioner (Internal Medicine) Tat, Octaviano Batty, DO as Consulting Physician (Neurology)  Extended Emergency Contact Information Primary Emergency Contact: [facility] Address: [address]          [place], [place] [address] [place] of [place] Home Phone: [phone] Mobile Phone: [phone] Relation: Spouse Secondary Emergency Contact: Abbey Chatters Mobile Phone: [phone] Relation: Daughter Preferred language: [place] Interpreter needed? No  Code Status:  Goals of Care: Advanced Directive information    03/05/2023    8:32 AM  Advanced Directives  Does Patient Have a Medical Advance Directive? Yes  Type of Estate agent of [place];Living will;Out of facility DNR (pink MOST or yellow form)  Does patient want to make changes to medical advance directive? No - Patient declined  Copy of Healthcare Power of Attorney in Chart? Yes - validated most recent copy scanned in chart (See row information)  Pre-existing out of facility DNR order (yellow form or pink MOST form) Pink MOST/Yellow Form most recent copy in chart - Physician notified to receive inpatient order      No chief complaint on file.   HPI: Patient is a 89 y.o. female seen today for medical management of chronic problems including chronic heart failure w, ith preserved ejection fraction dependent edema, dysuria, cognitive deficits with hallucinations, peripheral vascular disease, and Parkinson's disease.  I met with the patient and  her husband and 1 in the living room today.  Her main concern was urinary frequency.  This has been an intermittent problem as she suffers from urolithiasis.  She had urine culture done as recently as 3 weeks ago with E. coli sensitive to all antibiotics tested.  She is followed by urology. Regarding her Parkinson's she continues on levodopa carbidopa 25/100 3 times daily.  She continues to have hallucinations that are likely related.  Seroquel was increased by neurology to 25 mg twice daily.  She had been on tramadol but that was discontinued thinking that may have an impact on the hallucinations.  She is under hospice care. Also related to Parkinson's as hypotension treated with midodrine.  Her CHF is well compensated.  She only has trace edema taking furosemide.  Echocardiogram from 1221 shows ejection fraction of 60 to 65%.  Past Medical History:  Diagnosis Date   Abnormal nuclear stress test    Actinic cheilitis 04/23/2019   Anemia    iron deficiency    Anxiety    Aortic stenosis    severe by echo 2021    Arthritis    Broken arm    right    Chronic diastolic CHF (congestive heart failure) (HCC)    Chronic pain    Cognitive communication deficit    Cognitive deficits    Communication   Constipation    Depression    Erosive (osteo)arthritis 03/09/2020   Family history of adverse reaction to anesthesia    pt. states sister vomits   Fibromyalgia    GERD (gastroesophageal reflux disease)    H/O hiatal hernia    Hallucinations, visual 07/21/2018   03/10/20 psych  consult.    Hematuria 05/18/2020   History of bronchitis    History of kidney stones    Hypercholesterolemia    Hypertension    dr t turner   Hypothyroidism    Memory loss 04/21/2018   08/08/18 CT head no traumatic findings, atrophy.  09/27/19 MMSE 25/30, failed the clock drawing   Microhematuria 03/24/2015   Mild CAD    Neuropathy    Osteoporosis    Parkinson disease    PVD (peripheral vascular disease) (HCC)    99%  stenosis of left anteiror tibial artery, mod stenosis of left distal SFA and popliteal artery followed by Dr. Darrick Penna   RBBB    noted on EKG 2018   Restless leg syndrome    Sepsis (HCC)    hx of due to Ecoli   Septic shock (HCC) 04/19/2020   Shortness of breath    with exertion - chronic    Sjogren's disease (HCC)    Sjogren's syndrome (HCC) 02/04/2018   04/16/19 rheumatology: Erosive OA of hands, Sjogrens syndrome, low back pain at multiple sites, recurrent kidney stones, age related osteoporosis w/o current pathological fracture, f/u 6 months.    SOB (shortness of breath)    chronic due to diastolic dysfunction, deconditioning, obesity   Spinal stenosis    lumbar region    Tremor    Unstable gait 04/21/2018   Unsteadiness on feet    Urinary frequency 09/18/2018   Urinary tract infection    Visual hallucinations    Past Surgical History:  Procedure Laterality Date   ABDOMINAL HYSTERECTOMY     BACK SURGERY     4 back surgeries,   lumbar fusion   CARDIAC CATHETERIZATION  2006   normal   CYSTOSCOPY/URETEROSCOPY/HOLMIUM LASER/STENT PLACEMENT Left 01/06/2019   Procedure: CYSTOSCOPY/RETROGRADE/URETEROSCOPY/HOLMIUM LASER/BASKET RETRIEVAL/STENT PLACEMENT;  Surgeon: Rene Paci, MD;  Location: [facility];  Service: Urology;  Laterality: Left;   CYSTOSCOPY/URETEROSCOPY/HOLMIUM LASER/STENT PLACEMENT Bilateral 05/24/2020   Procedure: CYSTOSCOPY/RETROGRADE/URETEROSCOPY/HOLMIUM LASER/STENT PLACEMENT;  Surgeon: Rene Paci, MD;  Location: [facility];  Service: Urology;  Laterality: Bilateral;   CYSTOSCOPY/URETEROSCOPY/HOLMIUM LASER/STENT PLACEMENT Right 09/27/2021   Procedure: CYSTOSCOPY/BILATERAL RETROGRADE/URETEROSCOPY/HOLMIUM LASER/STENT PLACEMENT;  Surgeon: Rene Paci, MD;  Location: [facility];  Service: Urology;  Laterality: Right;  ONLY NEEDS 60 MIN   EYE SURGERY Bilateral    cateracts   falls     variious fall, broken wrist,and toes   FRACTURE SURGERY Right  April 2016   Wrist, Pt. fell   IR NEPHROSTOMY PLACEMENT RIGHT  04/19/2020   kidney stone removal Left 01/20/2019   RIGHT/LEFT HEART CATH AND CORONARY ANGIOGRAPHY N/A 02/05/2018   Procedure: RIGHT/LEFT HEART CATH AND CORONARY ANGIOGRAPHY;  Surgeon: Lennette Bihari, MD;  Location: [facility];  Service: Cardiovascular;  Laterality: N/A;   SPINAL CORD STIMULATOR INSERTION N/A 09/09/2015   Procedure: LUMBAR SPINAL CORD STIMULATOR INSERTION;  Surgeon: Odette Fraction, MD;  Location: [facility];  Service: Neurosurgery;  Laterality: N/A;  LUMBAR SPINAL CORD STIMULATOR INSERTION   SPINE SURGERY  April 2013   Back X's 4   TOTAL HIP ARTHROPLASTY     right   WRIST FRACTURE SURGERY Bilateral     reports that she has never smoked. She has never used smokeless tobacco. She reports that she does not drink alcohol and does not use drugs. Social History   Socioeconomic History   Marital status: Married    Spouse name: Jimmy Picket   Number of children: 2   Years of education:  HS   Highest education level: Not on file  Occupational History    Comment: Homemaker  Tobacco Use   Smoking status: Never   Smokeless tobacco: Never  Vaping Use   Vaping Use: Never used  Substance and Sexual Activity   Alcohol use: No    Alcohol/week: 0.0 standard drinks of alcohol   Drug use: No   Sexual activity: Never  Other Topics Concern   Not on file  Social History Narrative   Patient lives at friends home with her spouse. Married in 1957. Two people lives in the home, no pets.    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    Caffeine Use: tea   Social Determinants of Health   Financial Resource Strain: Low Risk  (10/27/2018)   Overall Financial Resource Strain (CARDIA)    Difficulty of Paying Living Expenses: Not hard at all  Food Insecurity: No Food Insecurity (10/27/2018)   Hunger Vital Sign    Worried About Running Out of Food in the Last Year: Never  true    Ran Out of Food in the Last Year: Never true  Transportation Needs: No Transportation Needs (10/27/2018)   PRAPARE - Administrator, Civil Service (Medical): No    Lack of Transportation (Non-Medical): No  Physical Activity: Insufficiently Active (10/27/2018)   Exercise Vital Sign    Days of Exercise per Week: 2 days    Minutes of Exercise per Session: 30 min  Stress: No Stress Concern Present (10/27/2018)   [employer] of Occupational Health - Occupational Stress Questionnaire    Feeling of Stress : Only a little  Social Connections: Socially Integrated (10/27/2018)   Social Connection and Isolation Panel [NHANES]    Frequency of Communication with Friends and Family: More than three times a week    Frequency of Social Gatherings with Friends and Family: More than three times a week    Attends Religious Services: More than 4 times per year    Active Member of Clubs or Organizations: Yes    Attends Banker Meetings: More than 4 times per year    Marital Status: Married  Catering manager Violence: Not At Risk (10/27/2018)   Humiliation, Afraid, Rape, and Kick questionnaire    Fear of Current or Ex-Partner: No    Emotionally Abused: No    Physically Abused: No    Sexually Abused: No    Functional Status Survey:    Family History  Problem Relation Age of Onset   Heart attack Mother    Restless legs syndrome Mother    Heart failure Mother    Heart disease Mother    Hypertension Mother    COPD Father     Health Maintenance  Topic Date Due   COVID-19 Vaccine (6 - 2023-24 season) 12/06/2022   INFLUENZA VACCINE  07/11/2023   DTaP/Tdap/Td (4 - Td or Tdap) 06/20/2026   Pneumonia Vaccine 59+ Years old  Completed   DEXA SCAN  Completed   Zoster Vaccines- Shingrix  Completed   HPV VACCINES  Aged Out    Allergies  Allergen Reactions   Phosphate     Other reaction(s): Unknown   Adrenalone    Codeine Other (See Comments)    Reaction:   Headaches and nightmares  Other reaction(s): Unknown   Lactose Other (See Comments)    abd pain, lactose intolerant   Lactose Intolerance (Gi) Nausea And Vomiting   Latex Rash  Lyrica [Pregabalin] Swelling and Other (See Comments)    Reaction:  Leg swelling   Morphine Other (See Comments)   Other Other (See Comments)    Pt states that pain medications give her nightmares.     Plaquenil [Hydroxychloroquine Sulfate] Other (See Comments)    Reaction:  GI upset    Reglan [Metoclopramide] Other (See Comments)    Reaction:  GI upset    Requip [Ropinirole Hcl] Other (See Comments)    Reaction:  GI upset    Septra [Sulfamethoxazole-Trimethoprim] Nausea And Vomiting   Shellfish Allergy Nausea And Vomiting   Sulfa Antibiotics Other (See Comments)    Headache, very sick    Outpatient Encounter Medications as of 05/15/2023  Medication Sig   carbidopa-levodopa (SINEMET IR) 25-100 MG tablet Take 1 tablet by mouth 3 (three) times daily.   Carbidopa-Levodopa ER (SINEMET CR) 25-100 MG tablet controlled release Take 1 tablet by mouth daily.   docusate sodium (COLACE) 100 MG capsule Take 100 mg by mouth at bedtime.   famotidine (PEPCID) 20 MG tablet Take 20 mg by mouth daily.   furosemide (LASIX) 20 MG tablet Take 1 tablet (20 mg total) by mouth daily.   hydroxychloroquine (PLAQUENIL) 200 MG tablet Take 200 mg by mouth daily.   lactose free nutrition (BOOST) LIQD Take by mouth 2 (two) times daily. 1/2 box; Resident likes chocolate. Twice A Day   levothyroxine (SYNTHROID, LEVOTHROID) 50 MCG tablet Take 50 mcg by mouth daily before breakfast.    lubiprostone (AMITIZA) 24 MCG capsule Take 24 mcg by mouth 2 (two) times daily with a meal.    midodrine (PROAMATINE) 5 MG tablet Take 1 tablet (5 mg total) by mouth 3 (three) times daily with meals.   Multiple Vitamins-Minerals (PRESERVISION AREDS 2 PO) Take 1 tablet by mouth daily.   nystatin powder Apply 1 Application topically daily. ABDOMINAL SKIN FOLD,  UNDER BREAST AND GROIN   pantoprazole (PROTONIX) 40 MG tablet Take 40 mg by mouth daily.   Polyethyl Glycol-Propyl Glycol (SYSTANE) 0.4-0.3 % SOLN Apply 1 drop to eye 2 (two) times daily as needed (dry eyes).    polyethylene glycol (MIRALAX / GLYCOLAX) 17 g packet Take 17 g by mouth daily.   potassium citrate (UROCIT-K) 10 MEQ (1080 MG) SR tablet Take 10 mEq by mouth 2 (two) times daily.   pramipexole (MIRAPEX) 0.125 MG tablet Take 0.125 mg by mouth at bedtime.   QUEtiapine (SEROQUEL) 25 MG tablet Take 1 tablet (25 mg total) by mouth 2 (two) times daily.   senna-docusate (SENOKOT-S) 8.6-50 MG tablet Take 1 tablet by mouth at bedtime.    Sertraline HCl 150 MG CAPS Take 125 mg by mouth at bedtime.   No facility-administered encounter medications on file as of 05/15/2023.    Review of Systems  Constitutional: Negative.   HENT: Negative.    Respiratory: Negative.    Cardiovascular: Negative.   Gastrointestinal:  Positive for constipation.  Musculoskeletal:  Positive for arthralgias.  Neurological:  Positive for tremors.  Psychiatric/Behavioral:  Positive for confusion and hallucinations.   All other systems reviewed and are negative.   There were no vitals filed for this visit. There is no height or weight on file to calculate BMI. Physical Exam Vitals and nursing note reviewed.  Constitutional:      Appearance: Normal appearance.     Comments: Sweet little lady who is very soft-spoken (probably an effect of Parkinson's)  Eyes:     Extraocular Movements: Extraocular movements intact.  Pupils: Pupils are equal, round, and reactive to light.  Cardiovascular:     Rate and Rhythm: Normal rate and regular rhythm.     Heart sounds: Murmur heard.  Pulmonary:     Effort: Pulmonary effort is normal.     Breath sounds: Normal breath sounds.  Abdominal:     General: Bowel sounds are normal.     Palpations: Abdomen is soft.  Musculoskeletal:     Comments: Ambulates mostly via wheelchair   Neurological:     General: No focal deficit present.     Mental Status: She is alert.     Comments: Oriented to person and place     Labs reviewed: Basic Metabolic Panel: Recent Labs    09/25/22 0000 01/01/23 0630  NA 143 140  K 4.4 4.3  CL 107 107  CO2 28* 25*  BUN 17 24*  CREATININE 1.0 1.0  CALCIUM 10.1 9.3   Liver Function Tests: Recent Labs    09/25/22 0000 01/01/23 0630  AST 24 23  ALT 14 13  ALKPHOS 76 67  ALBUMIN 4.2 3.7   No results for input(s): "LIPASE", "AMYLASE" in the last 8760 hours. No results for input(s): "AMMONIA" in the last 8760 hours. CBC: Recent Labs    09/25/22 0000 12/13/22 0630 01/01/23 0630  WBC 6.7 6.5 7.9  NEUTROABS  --  3,569.00 5,246.00  HGB 11.4* 10.6* 9.0*  HCT 35* 32* 27*  PLT 288 259 225   Cardiac Enzymes: No results for input(s): "CKTOTAL", "CKMB", "CKMBINDEX", "TROPONINI" in the last 8760 hours. BNP: Invalid input(s): "POCBNP" No results found for: "HGBA1C" Lab Results  Component Value Date   TSH 2.21 09/25/2022   No results found for: "VITAMINB12" No results found for: "FOLATE" No results found for: "IRON", "TIBC", "FERRITIN"  Imaging and Procedures obtained prior to SNF admission: DG C-Arm 1-60 Min  Result Date: 09/27/2021 CLINICAL DATA:  Cystoscopy with right ureteroscopy and laser lithotripsy. Right ureteral stent placement. Right ureteral dilatation. Bilateral retrograde pyelogram. EXAM: DG C-ARM 1-60 MIN CONTRAST:  Not provided FLUOROSCOPY TIME:  Fluoroscopy Time:  1 minute 9 seconds Radiation Exposure Index (if provided by the fluoroscopic device): 14.4 mGy Number of Acquired Spot Images: 6 COMPARISON:  CT urogram 09/08/2021 FINDINGS: Submitted intraoperative fluoroscopic images demonstrate balloon dilatation the distal right ureter. Right ureteral stent is seen. There is cannulation of the distal left ureter with retrograde opacification of the renal collecting system and ureter. IMPRESSION: Intraoperative  images of left retrograde pyelogram and right ureteral dilation and stent placement as above. Electronically Signed   By: Acquanetta Belling M.D.   On: 09/27/2021 14:26    Assessment/Plan 1. Chronic heart failure with preserved ejection fraction (HCC) Followed by cardiology.  No syncope    3. Hallucinations, visual Hallucinations persist despite use of Seroquel twice daily  4. Parkinson's disease without fluctuating manifestations, unspecified whether dyskinesia present Continues with Sinemet IR, twice daily and Sinemet CR 25 100 daily    Family/ staff Communication:   Labs/tests ordered:  Bertram Millard. Hyacinth Meeker, MD [facility] [address] [place], [place] [address] Office [phone]

## 2023-05-15 NOTE — Addendum Note (Signed)
Addended by: Frederica Kuster on: 05/15/2023 04:14 PM   Modules accepted: Level of Service

## 2023-05-30 ENCOUNTER — Encounter: Payer: Self-pay | Admitting: Nurse Practitioner

## 2023-05-30 ENCOUNTER — Non-Acute Institutional Stay (SKILLED_NURSING_FACILITY): Payer: Medicare Other | Admitting: Nurse Practitioner

## 2023-05-30 DIAGNOSIS — G2581 Restless legs syndrome: Secondary | ICD-10-CM

## 2023-05-30 DIAGNOSIS — K219 Gastro-esophageal reflux disease without esophagitis: Secondary | ICD-10-CM

## 2023-05-30 DIAGNOSIS — I5032 Chronic diastolic (congestive) heart failure: Secondary | ICD-10-CM

## 2023-05-30 DIAGNOSIS — E7800 Pure hypercholesterolemia, unspecified: Secondary | ICD-10-CM

## 2023-05-30 DIAGNOSIS — I951 Orthostatic hypotension: Secondary | ICD-10-CM | POA: Diagnosis not present

## 2023-05-30 DIAGNOSIS — E039 Hypothyroidism, unspecified: Secondary | ICD-10-CM

## 2023-05-30 DIAGNOSIS — D508 Other iron deficiency anemias: Secondary | ICD-10-CM

## 2023-05-30 DIAGNOSIS — F02B2 Dementia in other diseases classified elsewhere, moderate, with psychotic disturbance: Secondary | ICD-10-CM

## 2023-05-30 DIAGNOSIS — I350 Nonrheumatic aortic (valve) stenosis: Secondary | ICD-10-CM

## 2023-05-30 DIAGNOSIS — G20A1 Parkinson's disease without dyskinesia, without mention of fluctuations: Secondary | ICD-10-CM | POA: Diagnosis not present

## 2023-05-30 DIAGNOSIS — F323 Major depressive disorder, single episode, severe with psychotic features: Secondary | ICD-10-CM

## 2023-05-30 DIAGNOSIS — R441 Visual hallucinations: Secondary | ICD-10-CM

## 2023-05-30 DIAGNOSIS — K5909 Other constipation: Secondary | ICD-10-CM

## 2023-05-30 DIAGNOSIS — M154 Erosive (osteo)arthritis: Secondary | ICD-10-CM

## 2023-05-30 NOTE — Assessment & Plan Note (Signed)
LDL 64 01/11/22, off Rosuvastatin.  

## 2023-05-30 NOTE — Assessment & Plan Note (Signed)
off Fe, ASA, Hgb 9.0 01/01/23 

## 2023-05-30 NOTE — Assessment & Plan Note (Addendum)
More hallucination/delusion,  takes Sertraline,  will increase Quetiapine. Changed Tramadol to prn helped. TSH 2.21 09/25/22

## 2023-05-30 NOTE — Assessment & Plan Note (Signed)
takes Senokot S, Amitiza, Colace, MiraLax  °

## 2023-05-30 NOTE — Assessment & Plan Note (Signed)
Erosive arthritis, aches and pains,  takes Plaquenil, Tylenol  f/u Rheumatology.  

## 2023-05-30 NOTE — Assessment & Plan Note (Signed)
on Midodrine 

## 2023-05-30 NOTE — Assessment & Plan Note (Signed)
takes Levothyroxine TSH 2.21 09/25/22 

## 2023-05-30 NOTE — Assessment & Plan Note (Signed)
takes Pantoprazole, Famotidine, yearly FOBT per GI, Hgb 9.0 01/01/23 
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
## 2023-05-30 NOTE — Assessment & Plan Note (Signed)
Memory loss, progressing, SNF [facility] for supportive care.  MMSE 15/30 11/28/22, under Hospice care. 
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
## 2023-05-30 NOTE — Assessment & Plan Note (Signed)
RLS stable on MiraPex, Sinemet hs, better sleep at night.  

## 2023-05-30 NOTE — Assessment & Plan Note (Signed)
takes Sinemet, MiraPex f/u Dr. Tat, w/c for mobility most of time.  

## 2023-05-30 NOTE — Assessment & Plan Note (Signed)
AS/CAD Cardiology palliative approach. On Midodrine per Cardiology. More noticeable DOE, fatigue. under hospice service.  

## 2023-05-30 NOTE — Assessment & Plan Note (Signed)
CHF/trace edema BLE, takes Furosemide, cardiology, severe aortic valve stenosis. Echocardiogram 11/15/20 EF 60-65%. Bun/creat 24/1.0 01/01/23

## 2023-05-31 ENCOUNTER — Encounter: Payer: Self-pay | Admitting: Nurse Practitioner

## 2023-06-03 ENCOUNTER — Ambulatory Visit: Payer: Medicare Other | Admitting: Cardiology

## 2023-06-27 ENCOUNTER — Encounter: Payer: Self-pay | Admitting: Nurse Practitioner

## 2023-06-27 ENCOUNTER — Non-Acute Institutional Stay (SKILLED_NURSING_FACILITY): Payer: Medicare Other | Admitting: Nurse Practitioner

## 2023-06-27 DIAGNOSIS — M154 Erosive (osteo)arthritis: Secondary | ICD-10-CM

## 2023-06-27 DIAGNOSIS — N200 Calculus of kidney: Secondary | ICD-10-CM

## 2023-06-27 DIAGNOSIS — I350 Nonrheumatic aortic (valve) stenosis: Secondary | ICD-10-CM

## 2023-06-27 DIAGNOSIS — I951 Orthostatic hypotension: Secondary | ICD-10-CM

## 2023-06-27 DIAGNOSIS — D508 Other iron deficiency anemias: Secondary | ICD-10-CM

## 2023-06-27 DIAGNOSIS — G20A1 Parkinson's disease without dyskinesia, without mention of fluctuations: Secondary | ICD-10-CM

## 2023-06-27 DIAGNOSIS — I5032 Chronic diastolic (congestive) heart failure: Secondary | ICD-10-CM

## 2023-06-27 DIAGNOSIS — K623 Rectal prolapse: Secondary | ICD-10-CM

## 2023-06-27 DIAGNOSIS — F323 Major depressive disorder, single episode, severe with psychotic features: Secondary | ICD-10-CM

## 2023-06-27 DIAGNOSIS — G2581 Restless legs syndrome: Secondary | ICD-10-CM

## 2023-06-27 DIAGNOSIS — E039 Hypothyroidism, unspecified: Secondary | ICD-10-CM

## 2023-06-27 DIAGNOSIS — F02B2 Dementia in other diseases classified elsewhere, moderate, with psychotic disturbance: Secondary | ICD-10-CM

## 2023-06-27 DIAGNOSIS — K5909 Other constipation: Secondary | ICD-10-CM

## 2023-06-27 DIAGNOSIS — K219 Gastro-esophageal reflux disease without esophagitis: Secondary | ICD-10-CM

## 2023-06-27 NOTE — Progress Notes (Signed)
Location:  [facility] Room Number: NO/45/A Place of Service:  SNF (31) Provider: Verlene Glantz X, NP  Patient Care Team: Twan Harkin X, NP as PCP - General (Internal Medicine) Quintella Reichert, MD as PCP - Cardiology (Cardiology) Arman Bogus, MD (Neurosurgery) Suanne Marker, MD (Neurology) Carman Ching, MD (Inactive) as Consulting Physician (Gastroenterology) Berneice Heinrich Delbert Phenix., MD as Consulting Physician (Urology) Bostyn Bogie X, NP as Nurse Practitioner (Internal Medicine) Tat, Octaviano Batty, DO as Consulting Physician (Neurology)  Extended Emergency Contact Information Primary Emergency Contact: [facility] Address: [address]          [place], [place] [address] D[place] of [place] Home Phone: [phone] Mobile Phone: [phone] Relation: Spouse Secondary Emergency Contact: Abbey Chatters Mobile Phone: [phone] Relation: Daughter Preferred language: [place] Interpreter needed? No  Code Status:  DNR Goals of care: Advanced Directive information    06/27/2023   10:15 AM  Advanced Directives  Does Patient Have a Medical Advance Directive? Yes  Type of Estate agent of [place];Living will;Out of facility DNR (pink MOST or yellow form)  Does patient want to make changes to medical advance directive? No - Patient declined  Copy of Healthcare Power of Attorney in Chart? Yes - validated most recent copy scanned in chart (See row information)  Pre-existing out of facility DNR order (yellow form or pink MOST form) Pink MOST form placed in chart (order not valid for inpatient use);Yellow form placed in chart (order not valid for inpatient use)     Chief Complaint  Patient presents with   Medical Management of Chronic Issues    Routine visit    HPI:  Pt is a 89 y.o. female seen today for medical management of chronic diseases.   Memory loss, progressing, SNF [facility] for supportive care.  MMSE 15/30  11/28/22, under Hospice care.              Dry eyes, uses artificial tears.  Prolapsed rectum, avoid constipation, usually reduced on its own.              Hx of rena lithiasis, recurrent UTI, urosepsis, treated by ID. 09/19/21 cystoscopy, laser lithotripsy, R ureteral stent/dilatation. Occasional dysuria present. 06/25/23 UA negative for UTI             GERD, takes Pantoprazole, Famotidine, Reglan, yearly FOBT per GI, Hgb 9.0 01/01/23             Parkinson's, takes Sinemet, MiraPex f/u Dr. Arbutus Leas, w/c for mobility most of time.              Hypotension, on Midodrine.              RLS stable on MiraPex, Sinemet hs, better sleep at night.              Depression/hallucination,  takes Sertraline,  Quetiapine. Change Tramadol to prn helped. TSH 2.21 09/25/22             Constipation, takes Senokot S, Amitiza, Colace, MiraLax              Hypothyroidism, takes Levothyroxine TSH 2.21 09/25/22             Erosive arthritis, chronic lower back pain, travels to buttock and thighs,  aches and pains,  takes Plaquenil, Tylenol  f/u Rheumatology.              CHF/trace edema BLE, takes Furosemide, cardiology, severe aortic valve stenosis. Echocardiogram 11/15/20 EF  60-65%. Bun/creat 24/1.0 01/01/23             Hyperlipidemia, LDL 64 01/11/22, off Rosuvastatin.              Anemia, off Fe, ASA, Hgb 9.0 01/01/23             AS/CAD Cardiology palliative approach. On Midodrine per Cardiology. More noticeable DOE, fatigue. under hospice service.     Past Medical History:  Diagnosis Date   Abnormal nuclear stress test    Actinic cheilitis 04/23/2019   Anemia    iron deficiency    Anxiety    Aortic stenosis    severe by echo 2021    Arthritis    Broken arm    right    Chronic diastolic CHF (congestive heart failure) (HCC)    Chronic pain    Cognitive communication deficit    Cognitive deficits    Communication   Constipation    Depression    Erosive (osteo)arthritis 03/09/2020   Family history of adverse  reaction to anesthesia    pt. states sister vomits   Fibromyalgia    GERD (gastroesophageal reflux disease)    H/O hiatal hernia    Hallucinations, visual 07/21/2018   03/10/20 psych consult.    Hematuria 05/18/2020   History of bronchitis    History of kidney stones    Hypercholesterolemia    Hypertension    dr t turner   Hypothyroidism    Memory loss 04/21/2018   08/08/18 CT head no traumatic findings, atrophy.  09/27/19 MMSE 25/30, failed the clock drawing   Microhematuria 03/24/2015   Mild CAD    Neuropathy    Osteoporosis    Parkinson disease    PVD (peripheral vascular disease) (HCC)    99% stenosis of left anteiror tibial artery, mod stenosis of left distal SFA and popliteal artery followed by Dr. Darrick Penna   RBBB    noted on EKG 2018   Restless leg syndrome    Sepsis (HCC)    hx of due to Ecoli   Septic shock (HCC) 04/19/2020   Shortness of breath    with exertion - chronic    Sjogren's disease (HCC)    Sjogren's syndrome (HCC) 02/04/2018   04/16/19 rheumatology: Erosive OA of hands, Sjogrens syndrome, low back pain at multiple sites, recurrent kidney stones, age related osteoporosis w/o current pathological fracture, f/u 6 months.    SOB (shortness of breath)    chronic due to diastolic dysfunction, deconditioning, obesity   Spinal stenosis    lumbar region    Tremor    Unstable gait 04/21/2018   Unsteadiness on feet    Urinary frequency 09/18/2018   Urinary tract infection    Visual hallucinations    Past Surgical History:  Procedure Laterality Date   ABDOMINAL HYSTERECTOMY     BACK SURGERY     4 back surgeries,   lumbar fusion   CARDIAC CATHETERIZATION  2006   normal   CYSTOSCOPY/URETEROSCOPY/HOLMIUM LASER/STENT PLACEMENT Left 01/06/2019   Procedure: CYSTOSCOPY/RETROGRADE/URETEROSCOPY/HOLMIUM LASER/BASKET RETRIEVAL/STENT PLACEMENT;  Surgeon: Rene Paci, MD;  Location: [facility];  Service: Urology;  Laterality: Left;   CYSTOSCOPY/URETEROSCOPY/HOLMIUM  LASER/STENT PLACEMENT Bilateral 05/24/2020   Procedure: CYSTOSCOPY/RETROGRADE/URETEROSCOPY/HOLMIUM LASER/STENT PLACEMENT;  Surgeon: Rene Paci, MD;  Location: [facility];  Service: Urology;  Laterality: Bilateral;   CYSTOSCOPY/URETEROSCOPY/HOLMIUM LASER/STENT PLACEMENT Right 09/27/2021   Procedure: CYSTOSCOPY/BILATERAL RETROGRADE/URETEROSCOPY/HOLMIUM LASER/STENT PLACEMENT;  Surgeon: Rene Paci, MD;  Location: [facility];  Service: Urology;  Laterality: Right;  ONLY NEEDS 60 MIN   EYE SURGERY Bilateral    cateracts   falls     variious fall, broken wrist,and toes   FRACTURE SURGERY Right April 2016   Wrist, Pt. fell   IR NEPHROSTOMY PLACEMENT RIGHT  04/19/2020   kidney stone removal Left 01/20/2019   RIGHT/LEFT HEART CATH AND CORONARY ANGIOGRAPHY N/A 02/05/2018   Procedure: RIGHT/LEFT HEART CATH AND CORONARY ANGIOGRAPHY;  Surgeon: Lennette Bihari, MD;  Location: [facility];  Service: Cardiovascular;  Laterality: N/A;   SPINAL CORD STIMULATOR INSERTION N/A 09/09/2015   Procedure: LUMBAR SPINAL CORD STIMULATOR INSERTION;  Surgeon: Odette Fraction, MD;  Location: [facility];  Service: Neurosurgery;  Laterality: N/A;  LUMBAR SPINAL CORD STIMULATOR INSERTION   SPINE SURGERY  April 2013   Back X's 4   TOTAL HIP ARTHROPLASTY     right   WRIST FRACTURE SURGERY Bilateral     Allergies  Allergen Reactions   Phosphate     Other reaction(s): Unknown   Adrenalone    Codeine Other (See Comments)    Reaction:  Headaches and nightmares  Other reaction(s): Unknown   Lactose Other (See Comments)    abd pain, lactose intolerant   Lactose Intolerance (Gi) Nausea And Vomiting   Latex Rash   Lyrica [Pregabalin] Swelling and Other (See Comments)    Reaction:  Leg swelling   Morphine Other (See Comments)   Other Other (See Comments)    Pt states that pain medications give her nightmares.     Plaquenil [Hydroxychloroquine Sulfate] Other (See Comments)    Reaction:  GI upset     Reglan [Metoclopramide] Other (See Comments)    Reaction:  GI upset    Requip [Ropinirole Hcl] Other (See Comments)    Reaction:  GI upset    Septra [Sulfamethoxazole-Trimethoprim] Nausea And Vomiting   Shellfish Allergy Nausea And Vomiting   Sulfa Antibiotics Other (See Comments)    Headache, very sick    Outpatient Encounter Medications as of 06/27/2023  Medication Sig   carbidopa-levodopa (SINEMET IR) 25-100 MG tablet Take 1 tablet by mouth 3 (three) times daily.   Carbidopa-Levodopa ER (SINEMET CR) 25-100 MG tablet controlled release Take 1 tablet by mouth daily.   docusate sodium (COLACE) 100 MG capsule Take 100 mg by mouth at bedtime.   famotidine (PEPCID) 20 MG tablet Take 20 mg by mouth daily.   furosemide (LASIX) 20 MG tablet Take 1 tablet (20 mg total) by mouth daily.   hydroxychloroquine (PLAQUENIL) 200 MG tablet Take 200 mg by mouth daily.   lactose free nutrition (BOOST) LIQD Take by mouth 2 (two) times daily. 1/2 box; Resident likes chocolate. Twice A Day   levothyroxine (SYNTHROID, LEVOTHROID) 50 MCG tablet Take 50 mcg by mouth daily before breakfast.    lubiprostone (AMITIZA) 24 MCG capsule Take 24 mcg by mouth 2 (two) times daily with a meal.    midodrine (PROAMATINE) 5 MG tablet Take 1 tablet (5 mg total) by mouth 3 (three) times daily with meals.   Multiple Vitamins-Minerals (PRESERVISION AREDS 2 PO) Take 1 tablet by mouth daily.   nystatin powder Apply 1 Application topically daily. ABDOMINAL SKIN FOLD, UNDER BREAST AND GROIN   pantoprazole (PROTONIX) 40 MG tablet Take 40 mg by mouth daily.   Polyethyl Glycol-Propyl Glycol (SYSTANE) 0.4-0.3 % SOLN Apply 1 drop to eye 2 (two) times daily as needed (dry eyes).    polyethylene glycol (MIRALAX / GLYCOLAX) 17 g packet Take 17 g by mouth  daily.   potassium citrate (UROCIT-K) 10 MEQ (1080 MG) SR tablet Take 10 mEq by mouth 2 (two) times daily.   pramipexole (MIRAPEX) 0.125 MG tablet Take 0.125 mg by mouth at bedtime.    QUEtiapine (SEROQUEL) 25 MG tablet Take 1 tablet (25 mg total) by mouth 2 (two) times daily.   senna-docusate (SENOKOT-S) 8.6-50 MG tablet Take 1 tablet by mouth at bedtime.    Sertraline HCl 150 MG CAPS Take 125 mg by mouth at bedtime.   No facility-administered encounter medications on file as of 06/27/2023.    Review of Systems  Unable to perform ROS: Dementia    Immunization History  Administered Date(s) Administered   Hepatitis A, Adult 03/01/1997, 09/20/1997   Influenza Split 09/09/2013, 09/27/2014   Influenza, High Dose Seasonal PF 08/01/2011, 08/27/2012, 10/14/2013, 09/08/2015, 09/10/2016, 09/12/2017, 09/12/2018, 09/15/2019, 10/03/2022   Influenza,inj,Quad PF,6+ Mos 09/11/2016   Influenza-Unspecified 09/21/2020, 09/28/2021, 10/03/2022   Moderna SARS-COV2 Booster Vaccination 04/27/2022, 10/11/2022   Moderna Sars-Covid-2 Vaccination 12/12/2019, 01/09/2020, 10/18/2020, 05/09/2021, 08/29/2021   Pneumococcal Conjugate-13 04/15/2014   Pneumococcal Polysaccharide-23 09/28/2005   Td 06/20/2016   Tdap 04/29/2006, 09/01/2015   Zoster Recombinant(Shingrix) 06/11/2018, 09/10/2018   Zoster, Live 04/29/2006   Pertinent  Health Maintenance Due  Topic Date Due   INFLUENZA VACCINE  07/11/2023   DEXA SCAN  Completed      09/25/2022    2:40 PM 09/28/2022   11:43 AM 11/27/2022   10:40 AM 01/22/2023   11:45 AM 03/05/2023    8:29 AM  Fall Risk  Falls in the past year? 0 0 0 0 0  Was there an injury with Fall?  0 0 0 0  Fall Risk Category Calculator  0 0 0 0  Fall Risk Category (Retired)  Low Low    (RETIRED) Patient Fall Risk Level   Low fall risk    Patient at Risk for Falls Due to  History of fall(s) History of fall(s) History of fall(s) History of fall(s)  Fall risk Follow up  Falls evaluation completed Falls evaluation completed Falls evaluation completed Falls evaluation completed   Functional Status Survey:    Vitals:   06/27/23 0959  BP: 124/75  Pulse: 75  Resp: 17   Temp: 97.9 F (36.6 C)  SpO2: 96%  Weight: 114 lb 8 oz (51.9 kg)  Height: 4\' 8"  (1.422 m)   Body mass index is 25.67 kg/m. Physical Exam Vitals and nursing note reviewed.  Constitutional:      Appearance: Normal appearance.  HENT:     Head: Normocephalic and atraumatic.     Nose: Nose normal.     Mouth/Throat:     Mouth: Mucous membranes are moist.  Eyes:     Extraocular Movements: Extraocular movements intact.     Conjunctiva/sclera: Conjunctivae normal.     Pupils: Pupils are equal, round, and reactive to light.  Cardiovascular:     Rate and Rhythm: Normal rate and regular rhythm.     Heart sounds: Murmur heard.  Pulmonary:     Effort: Pulmonary effort is normal.     Breath sounds: No rales.  Abdominal:     General: Bowel sounds are normal.     Palpations: Abdomen is soft.     Tenderness: There is no abdominal tenderness.  Genitourinary:    Comments: Hx of reported protruded rectum was reduced on its own, a external hemorrhoid at 7pm, no s/s of injury.  C/o burning sensation upon urination, poor historian.  Musculoskeletal:  General: Tenderness present.     Cervical back: Normal range of motion and neck supple.     Right lower leg: Edema present.     Left lower leg: Edema present.     Comments: Trace edema BLE.  Arthritic changes in fingers, R>L. The R 3rd finger mid knuckle swelling, no heat or redness. Chronic lower back pain travels to buttocks/thighs  Skin:    General: Skin is warm and dry.  Neurological:     General: No focal deficit present.     Mental Status: She is alert. Mental status is at baseline.     Gait: Gait abnormal.     Comments: Oriented to person, place. Ambulates with walker.   Psychiatric:     Comments: Visual hallucination vs delusion is chronic, progressing.      Labs reviewed: Recent Labs    09/25/22 0000 01/01/23 0630  NA 143 140  K 4.4 4.3  CL 107 107  CO2 28* 25*  BUN 17 24*  CREATININE 1.0 1.0  CALCIUM 10.1 9.3    Recent Labs    09/25/22 0000 01/01/23 0630  AST 24 23  ALT 14 13  ALKPHOS 76 67  ALBUMIN 4.2 3.7   Recent Labs    09/25/22 0000 12/13/22 0630 01/01/23 0630  WBC 6.7 6.5 7.9  NEUTROABS  --  3,569.00 5,246.00  HGB 11.4* 10.6* 9.0*  HCT 35* 32* 27*  PLT 288 259 225   Lab Results  Component Value Date   TSH 2.21 09/25/2022   No results found for: "HGBA1C" Lab Results  Component Value Date   CHOL 137 01/11/2022   HDL 47 01/11/2022   LDLCALC 64 01/11/2022   TRIG 184 (A) 01/11/2022   CHOLHDL 2.4 07/14/2019    Significant Diagnostic Results in last 30 days:  No results found.  Assessment/Plan Erosive (osteo)arthritis chronic lower back pain, travels to buttock and thighs,  aches and pains,  takes Plaquenil, Tylenol  f/u Rheumatology. will add Mobic 7.5mg  every day po with food x1 week.   Depression, psychotic (HCC)   takes Sertraline,  increase Quetiapine 25mg  in am, 50mg  pm for better managing hallucination/delusions. Change Tramadol to prn helped. TSH 2.21 09/25/22  Restless leg syndrome stable on MiraPex, Sinemet hs, better sleep at night.   Orthostasis Blood pressure is maintained,  on Midodrine.   Parkinson's disease (HCC) takes Sinemet, MiraPex f/u Dr. Arbutus Leas, w/c for mobility most of time.   GERD (gastroesophageal reflux disease)  takes Pantoprazole, Famotidine, Reglan, yearly FOBT per GI, Hgb 9.0 01/01/23  Renal lithiasis Hx of rena lithiasis, recurrent UTI, urosepsis, treated by ID. 09/19/21 cystoscopy, laser lithotripsy, R ureteral stent/dilatation. Occasional dysuria present. 06/25/23 UA negative for UTI  Rectal prolapse avoid constipation, usually reduced on its own.   Parkinson's disease dementia (HCC)  progressing, SNF [facility] for supportive care.  MMSE 15/30 11/28/22, under Hospice care.   Chronic constipation Stable,  takes Senokot S, Amitiza, Colace, MiraLax   Hypothyroidism  takes Levothyroxine TSH 2.21 09/25/22  (HFpEF) heart failure with  preserved ejection fraction (HCC) /trace edema BLE, takes Furosemide, cardiology, severe aortic valve stenosis. Echocardiogram 11/15/20 EF 60-65%. Bun/creat 24/1.0 01/01/23  Iron deficiency anemia off Fe, ASA, Hgb 9.0 01/01/23  Severe aortic stenosis - not a candidate for TAVR   AS/CAD Cardiology palliative approach. On Midodrine per Cardiology. More noticeable DOE, fatigue. under hospice service.     Family/ staff Communication: plan of care reviewed with the patient, the patient's husband, and charge nurse.  Labs/tests ordered: none  Time spend 35 minutes.

## 2023-06-28 ENCOUNTER — Encounter: Payer: Self-pay | Admitting: Nurse Practitioner

## 2023-06-28 NOTE — Assessment & Plan Note (Signed)
Stable, takes Senokot S, Amitiza, Colace, MiraLax  

## 2023-06-28 NOTE — Assessment & Plan Note (Signed)
off Fe, ASA, Hgb 9.0 01/01/23 

## 2023-06-28 NOTE — Assessment & Plan Note (Signed)
progressing, SNF [facility] for supportive care.  MMSE 15/30 11/28/22, under Hospice care.

## 2023-06-28 NOTE — Assessment & Plan Note (Signed)
takes Sertraline,  increase Quetiapine 25mg  in am, 50mg  pm for better managing hallucination/delusions. Change Tramadol to prn helped. TSH 2.21 09/25/22

## 2023-06-28 NOTE — Assessment & Plan Note (Signed)
avoid constipation, usually reduced on its own.  

## 2023-06-28 NOTE — Assessment & Plan Note (Signed)
stable on MiraPex, Sinemet hs, better sleep at night.  

## 2023-06-28 NOTE — Assessment & Plan Note (Signed)
Blood pressure is maintained, on Midodrine  

## 2023-06-28 NOTE — Assessment & Plan Note (Signed)
Hx of rena lithiasis, recurrent UTI, urosepsis, treated by ID. 09/19/21 cystoscopy, laser lithotripsy, R ureteral stent/dilatation. Occasional dysuria present. 06/25/23 UA negative for UTI

## 2023-06-28 NOTE — Assessment & Plan Note (Signed)
takes Levothyroxine TSH 2.21 09/25/22 

## 2023-06-28 NOTE — Assessment & Plan Note (Signed)
trace edema BLE, takes Furosemide, cardiology, severe aortic valve stenosis. Echocardiogram 11/15/20 EF 60-65%. Bun/creat 24/1.0 01/01/23

## 2023-06-28 NOTE — Assessment & Plan Note (Signed)
chronic lower back pain, travels to buttock and thighs,  aches and pains,  takes Plaquenil, Tylenol  f/u Rheumatology. will add Mobic 7.5mg  every day po with food x1 week.

## 2023-06-28 NOTE — Assessment & Plan Note (Signed)
AS/CAD Cardiology palliative approach. On Midodrine per Cardiology. More noticeable DOE, fatigue. under hospice service.  

## 2023-06-28 NOTE — Assessment & Plan Note (Signed)
takes Pantoprazole, Famotidine, Reglan, yearly FOBT per GI, Hgb 9.0 01/01/23

## 2023-06-28 NOTE — Assessment & Plan Note (Signed)
takes Sinemet, MiraPex f/u Dr. Tat, w/c for mobility most of time.  

## 2023-07-23 ENCOUNTER — Encounter: Payer: Self-pay | Admitting: Nurse Practitioner

## 2023-07-23 ENCOUNTER — Non-Acute Institutional Stay (SKILLED_NURSING_FACILITY): Payer: Medicare Other | Admitting: Nurse Practitioner

## 2023-07-23 DIAGNOSIS — K219 Gastro-esophageal reflux disease without esophagitis: Secondary | ICD-10-CM

## 2023-07-23 DIAGNOSIS — G2581 Restless legs syndrome: Secondary | ICD-10-CM

## 2023-07-23 DIAGNOSIS — D509 Iron deficiency anemia, unspecified: Secondary | ICD-10-CM | POA: Diagnosis not present

## 2023-07-23 DIAGNOSIS — G20A1 Parkinson's disease without dyskinesia, without mention of fluctuations: Secondary | ICD-10-CM

## 2023-07-23 DIAGNOSIS — I951 Orthostatic hypotension: Secondary | ICD-10-CM

## 2023-07-23 DIAGNOSIS — E7800 Pure hypercholesterolemia, unspecified: Secondary | ICD-10-CM | POA: Diagnosis not present

## 2023-07-23 DIAGNOSIS — F02B2 Dementia in other diseases classified elsewhere, moderate, with psychotic disturbance: Secondary | ICD-10-CM

## 2023-07-23 DIAGNOSIS — I5032 Chronic diastolic (congestive) heart failure: Secondary | ICD-10-CM

## 2023-07-23 DIAGNOSIS — R441 Visual hallucinations: Secondary | ICD-10-CM

## 2023-07-23 DIAGNOSIS — K5909 Other constipation: Secondary | ICD-10-CM

## 2023-07-23 DIAGNOSIS — M154 Erosive (osteo)arthritis: Secondary | ICD-10-CM

## 2023-07-23 DIAGNOSIS — E039 Hypothyroidism, unspecified: Secondary | ICD-10-CM

## 2023-07-23 DIAGNOSIS — I350 Nonrheumatic aortic (valve) stenosis: Secondary | ICD-10-CM

## 2023-07-23 DIAGNOSIS — N390 Urinary tract infection, site not specified: Secondary | ICD-10-CM

## 2023-07-23 DIAGNOSIS — Z66 Do not resuscitate: Secondary | ICD-10-CM

## 2023-07-23 DIAGNOSIS — N200 Calculus of kidney: Secondary | ICD-10-CM

## 2023-07-23 NOTE — Assessment & Plan Note (Signed)
CHF/trace edema BLE, takes Furosemide, cardiology, severe aortic valve stenosis. Echocardiogram 11/15/20 EF 60-65%. Bun/creat 24/1.0 01/01/23, weight stable.

## 2023-07-23 NOTE — Assessment & Plan Note (Signed)
Stable, takes Senokot S, Amitiza, Colace, MiraLax  

## 2023-07-23 NOTE — Assessment & Plan Note (Signed)
Hx of rena lithiasis, recurrent UTI, urosepsis, treated by ID. 09/19/21 cystoscopy, laser lithotripsy, R ureteral stent/dilatation. Occasional dysuria present.  07/23/23 staff reported the patient c/o dysuria, the patient denied suprapubic pain/pressure, poor historian/unreliable HPI, afebrile, will obtain CBC/diff, CMP/eGFR to evaluate further. Observe.

## 2023-07-23 NOTE — Assessment & Plan Note (Signed)
,   chronic lower back pain, travels to buttock and thighs,  aches and pains,  takes Plaquenil, Tylenol  f/u Rheumatology.

## 2023-07-23 NOTE — Assessment & Plan Note (Signed)
on Midodrine 

## 2023-07-23 NOTE — Assessment & Plan Note (Signed)
off Fe, ASA, Hgb 9.0 01/01/23 

## 2023-07-23 NOTE — Progress Notes (Addendum)
Location:  [facility] Room Number: N045-A Place of Service:  SNF (31) Provider:  Annah Jasko X, NP  Patient Care Team: Akeema Broder X, NP as PCP - General (Internal Medicine) Quintella Reichert, MD as PCP - Cardiology (Cardiology) Arman Bogus, MD (Neurosurgery) Suanne Marker, MD (Neurology) Carman Ching, MD (Inactive) as Consulting Physician (Gastroenterology) Berneice Heinrich Delbert Phenix., MD as Consulting Physician (Urology) Phylisha Dix X, NP as Nurse Practitioner (Internal Medicine) Tat, Octaviano Batty, DO as Consulting Physician (Neurology)  Extended Emergency Contact Information Primary Emergency Contact: [facility] Address: [address]          [place], [place] [address] D[place] of [place] Home Phone: [phone] Mobile Phone: [phone] Relation: Spouse Secondary Emergency Contact: Abbey Chatters Mobile Phone: [phone] Relation: Daughter Preferred language: [place] Interpreter needed? No  Code Status:  DNR Goals of care: Advanced Directive information    07/23/2023    9:07 AM  Advanced Directives  Does Patient Have a Medical Advance Directive? Yes  Type of Estate agent of [place];Living will;Out of facility DNR (pink MOST or yellow form)  Does patient want to make changes to medical advance directive? No - Patient declined  Copy of Healthcare Power of Attorney in Chart? Yes - validated most recent copy scanned in chart (See row information)  Pre-existing out of facility DNR order (yellow form or pink MOST form) Yellow form placed in chart (order not valid for inpatient use);Pink MOST form placed in chart (order not valid for inpatient use)     Chief Complaint  Patient presents with   Medical Management of Chronic Issues    Routine visit. Discuss need for covid booster and flu vaccine.     HPI:  Pt is a 89 y.o. female seen today for medical management of chronic diseases.   Memory loss,  progressing, SNF [facility] for supportive care.  MMSE 15/30 11/28/22, under Hospice care.              Dry eyes, uses artificial tears.  Prolapsed rectum, avoid constipation, usually reduced on its own.              Hx of rena lithiasis, recurrent UTI, urosepsis, treated by ID. 09/19/21 cystoscopy, laser lithotripsy, R ureteral stent/dilatation. Occasional dysuria present.              GERD, takes Pantoprazole, Famotidine, Reglan, yearly FOBT per GI, Hgb 9.0 01/01/23             Parkinson's, takes Sinemet, MiraPex f/u Dr. Arbutus Leas, w/c for mobility most of time.              Hypotension, on Midodrine.              RLS stable on MiraPex, Sinemet hs, better sleep at night.              Depression/hallucination,  takes Sertraline,  Quetiapine. Change Tramadol to prn helped. TSH 2.21 09/25/22             Constipation, takes Senokot S, Amitiza, Colace, MiraLax              Hypothyroidism, takes Levothyroxine TSH 2.21 09/25/22             Erosive arthritis, chronic lower back pain, travels to buttock and thighs,  aches and pains,  takes Plaquenil, Tylenol  f/u Rheumatology.              CHF/trace edema BLE, takes Furosemide, cardiology,  severe aortic valve stenosis. Echocardiogram 11/15/20 EF 60-65%. Bun/creat 24/1.0 01/01/23, weight stable.              Hyperlipidemia, LDL 64 01/11/22, off Rosuvastatin.              Anemia, off Fe, ASA, Hgb 9.0 01/01/23             AS/CAD Cardiology palliative approach. On Midodrine per Cardiology. More noticeable DOE, fatigue. under hospice service.        Past Medical History:  Diagnosis Date   Abnormal nuclear stress test    Actinic cheilitis 04/23/2019   Anemia    iron deficiency    Anxiety    Aortic stenosis    severe by echo 2021    Arthritis    Broken arm    right    Chronic diastolic CHF (congestive heart failure) (HCC)    Chronic pain    Cognitive communication deficit    Cognitive deficits    Communication   Constipation    Depression    Erosive  (osteo)arthritis 03/09/2020   Family history of adverse reaction to anesthesia    pt. states sister vomits   Fibromyalgia    GERD (gastroesophageal reflux disease)    H/O hiatal hernia    Hallucinations, visual 07/21/2018   03/10/20 psych consult.    Hematuria 05/18/2020   History of bronchitis    History of kidney stones    Hypercholesterolemia    Hypertension    dr t turner   Hypothyroidism    Memory loss 04/21/2018   08/08/18 CT head no traumatic findings, atrophy.  09/27/19 MMSE 25/30, failed the clock drawing   Microhematuria 03/24/2015   Mild CAD    Neuropathy    Osteoporosis    Parkinson disease    PVD (peripheral vascular disease) (HCC)    99% stenosis of left anteiror tibial artery, mod stenosis of left distal SFA and popliteal artery followed by Dr. Darrick Penna   RBBB    noted on EKG 2018   Restless leg syndrome    Sepsis (HCC)    hx of due to Ecoli   Septic shock (HCC) 04/19/2020   Shortness of breath    with exertion - chronic    Sjogren's disease (HCC)    Sjogren's syndrome (HCC) 02/04/2018   04/16/19 rheumatology: Erosive OA of hands, Sjogrens syndrome, low back pain at multiple sites, recurrent kidney stones, age related osteoporosis w/o current pathological fracture, f/u 6 months.    SOB (shortness of breath)    chronic due to diastolic dysfunction, deconditioning, obesity   Spinal stenosis    lumbar region    Tremor    Unstable gait 04/21/2018   Unsteadiness on feet    Urinary frequency 09/18/2018   Urinary tract infection    Visual hallucinations    Past Surgical History:  Procedure Laterality Date   ABDOMINAL HYSTERECTOMY     BACK SURGERY     4 back surgeries,   lumbar fusion   CARDIAC CATHETERIZATION  2006   normal   CYSTOSCOPY/URETEROSCOPY/HOLMIUM LASER/STENT PLACEMENT Left 01/06/2019   Procedure: CYSTOSCOPY/RETROGRADE/URETEROSCOPY/HOLMIUM LASER/BASKET RETRIEVAL/STENT PLACEMENT;  Surgeon: Rene Paci, MD;  Location: [facility];  Service:  Urology;  Laterality: Left;   CYSTOSCOPY/URETEROSCOPY/HOLMIUM LASER/STENT PLACEMENT Bilateral 05/24/2020   Procedure: CYSTOSCOPY/RETROGRADE/URETEROSCOPY/HOLMIUM LASER/STENT PLACEMENT;  Surgeon: Rene Paci, MD;  Location: [facility];  Service: Urology;  Laterality: Bilateral;   CYSTOSCOPY/URETEROSCOPY/HOLMIUM LASER/STENT PLACEMENT Right 09/27/2021   Procedure: CYSTOSCOPY/BILATERAL RETROGRADE/URETEROSCOPY/HOLMIUM LASER/STENT PLACEMENT;  Surgeon: Rhoderick Moody  Clifton Custard, MD;  Location: [facility];  Service: Urology;  Laterality: Right;  ONLY NEEDS 60 MIN   EYE SURGERY Bilateral    cateracts   falls     variious fall, broken wrist,and toes   FRACTURE SURGERY Right April 2016   Wrist, Pt. fell   IR NEPHROSTOMY PLACEMENT RIGHT  04/19/2020   kidney stone removal Left 01/20/2019   RIGHT/LEFT HEART CATH AND CORONARY ANGIOGRAPHY N/A 02/05/2018   Procedure: RIGHT/LEFT HEART CATH AND CORONARY ANGIOGRAPHY;  Surgeon: Lennette Bihari, MD;  Location: [facility];  Service: Cardiovascular;  Laterality: N/A;   SPINAL CORD STIMULATOR INSERTION N/A 09/09/2015   Procedure: LUMBAR SPINAL CORD STIMULATOR INSERTION;  Surgeon: Odette Fraction, MD;  Location: [facility];  Service: Neurosurgery;  Laterality: N/A;  LUMBAR SPINAL CORD STIMULATOR INSERTION   SPINE SURGERY  April 2013   Back X's 4   TOTAL HIP ARTHROPLASTY     right   WRIST FRACTURE SURGERY Bilateral     Allergies  Allergen Reactions   Phosphate     Other reaction(s): Unknown   Adrenalone    Codeine Other (See Comments)    Reaction:  Headaches and nightmares  Other reaction(s): Unknown   Lactose Other (See Comments)    abd pain, lactose intolerant   Lactose Intolerance (Gi) Nausea And Vomiting   Latex Rash   Lyrica [Pregabalin] Swelling and Other (See Comments)    Reaction:  Leg swelling   Morphine Other (See Comments)   Other Other (See Comments)    Pt states that pain medications give her nightmares.     Plaquenil  [Hydroxychloroquine Sulfate] Other (See Comments)    Reaction:  GI upset    Reglan [Metoclopramide] Other (See Comments)    Reaction:  GI upset    Requip [Ropinirole Hcl] Other (See Comments)    Reaction:  GI upset    Septra [Sulfamethoxazole-Trimethoprim] Nausea And Vomiting   Shellfish Allergy Nausea And Vomiting   Sulfa Antibiotics Other (See Comments)    Headache, very sick    Outpatient Encounter Medications as of 07/23/2023  Medication Sig   acetaminophen (TYLENOL) 325 MG tablet Take 650 mg by mouth every 4 (four) hours as needed. Do not exceed 3000 mg of acetaminophen in 24 hours   acetaminophen (TYLENOL) 500 MG tablet Take 500 mg by mouth 3 (three) times daily.   carbidopa-levodopa (SINEMET IR) 25-100 MG tablet Take 1 tablet by mouth 3 (three) times daily.   Carbidopa-Levodopa ER (SINEMET CR) 25-100 MG tablet controlled release Take 1 tablet by mouth at bedtime. See other listing   docusate sodium (COLACE) 100 MG capsule Take 100 mg by mouth at bedtime.   famotidine (PEPCID) 20 MG tablet Take 20 mg by mouth daily.   furosemide (LASIX) 20 MG tablet Take 1 tablet (20 mg total) by mouth daily.   hydroxychloroquine (PLAQUENIL) 200 MG tablet Take 200 mg by mouth daily.   lactose free nutrition (BOOST) LIQD Take 237 mLs by mouth daily.   levothyroxine (SYNTHROID, LEVOTHROID) 50 MCG tablet Take 50 mcg by mouth daily before breakfast.    lubiprostone (AMITIZA) 24 MCG capsule Take 24 mcg by mouth 2 (two) times daily with a meal.    midodrine (PROAMATINE) 5 MG tablet Take 1 tablet (5 mg total) by mouth 3 (three) times daily with meals.   Multiple Vitamins-Minerals (PRESERVISION AREDS 2 PO) Take 1 tablet by mouth daily.   nystatin powder Apply 1 Application topically daily. ABDOMINAL SKIN FOLD, UNDER BREAST AND  GROIN   pantoprazole (PROTONIX) 40 MG tablet Take 40 mg by mouth daily.   Polyethyl Glycol-Propyl Glycol (SYSTANE) 0.4-0.3 % SOLN Apply 1 drop to eye 2 (two) times daily as needed  (dry eyes).    polyethylene glycol (MIRALAX / GLYCOLAX) 17 g packet Take 17 g by mouth daily.   potassium citrate (UROCIT-K) 10 MEQ (1080 MG) SR tablet Take 10 mEq by mouth 2 (two) times daily.   pramipexole (MIRAPEX) 0.125 MG tablet Take 0.125 mg by mouth at bedtime.   QUEtiapine (SEROQUEL) 25 MG tablet Take 25 mg by mouth daily. See other listing   QUEtiapine (SEROQUEL) 50 MG tablet Take 50 mg by mouth daily. See other listing   senna-docusate (SENOKOT-S) 8.6-50 MG tablet Take 1 tablet by mouth at bedtime.    sertraline (ZOLOFT) 100 MG tablet Take 100 mg by mouth at bedtime. With 25 mg tablet to equal 125 mg   sertraline (ZOLOFT) 25 MG tablet Take 25 mg by mouth at bedtime. Along with 100 mg tablet to equal 125 mg   [DISCONTINUED] QUEtiapine (SEROQUEL) 25 MG tablet Take 1 tablet (25 mg total) by mouth 2 (two) times daily.   [DISCONTINUED] Sertraline HCl 150 MG CAPS Take 125 mg by mouth at bedtime.   No facility-administered encounter medications on file as of 07/23/2023.    Review of Systems  Unable to perform ROS: Dementia    Immunization History  Administered Date(s) Administered   Hepatitis A, Adult 03/01/1997, 09/20/1997   Influenza Split 09/09/2013, 09/27/2014   Influenza, High Dose Seasonal PF 08/01/2011, 08/27/2012, 10/14/2013, 09/08/2015, 09/10/2016, 09/12/2017, 09/12/2018, 09/15/2019, 10/03/2022   Influenza,inj,Quad PF,6+ Mos 09/11/2016   Influenza-Unspecified 09/21/2020, 09/28/2021, 10/03/2022   Moderna SARS-COV2 Booster Vaccination 04/27/2022, 10/11/2022   Moderna Sars-Covid-2 Vaccination 12/12/2019, 01/09/2020, 10/18/2020, 05/09/2021, 08/29/2021   Pneumococcal Conjugate-13 04/15/2014   Pneumococcal Polysaccharide-23 09/28/2005   Td 06/20/2016   Tdap 04/29/2006, 09/01/2015   Zoster Recombinant(Shingrix) 06/11/2018, 09/10/2018   Zoster, Live 04/29/2006   Pertinent  Health Maintenance Due  Topic Date Due   INFLUENZA VACCINE  07/11/2023   DEXA SCAN  Completed       09/25/2022    2:40 PM 09/28/2022   11:43 AM 11/27/2022   10:40 AM 01/22/2023   11:45 AM 03/05/2023    8:29 AM  Fall Risk  Falls in the past year? 0 0 0 0 0  Was there an injury with Fall?  0 0 0 0  Fall Risk Category Calculator  0 0 0 0  Fall Risk Category (Retired)  Low Low    (RETIRED) Patient Fall Risk Level   Low fall risk    Patient at Risk for Falls Due to  History of fall(s) History of fall(s) History of fall(s) History of fall(s)  Fall risk Follow up  Falls evaluation completed Falls evaluation completed Falls evaluation completed Falls evaluation completed   Functional Status Survey:    Vitals:   07/23/23 0905  BP: 109/61  Pulse: 82  SpO2: 96%  Weight: 117 lb 4.8 oz (53.2 kg)  Height: 4\' 8"  (1.422 m)   Body mass index is 26.3 kg/m. Physical Exam Vitals and nursing note reviewed.  Constitutional:      Appearance: Normal appearance.  HENT:     Head: Normocephalic and atraumatic.     Nose: Nose normal.     Mouth/Throat:     Mouth: Mucous membranes are moist.  Eyes:     Extraocular Movements: Extraocular movements intact.     Conjunctiva/sclera: Conjunctivae normal.  Pupils: Pupils are equal, round, and reactive to light.  Cardiovascular:     Rate and Rhythm: Normal rate and regular rhythm.     Heart sounds: Murmur heard.  Pulmonary:     Effort: Pulmonary effort is normal.     Breath sounds: No rales.  Abdominal:     General: Bowel sounds are normal.     Palpations: Abdomen is soft.     Tenderness: There is no abdominal tenderness.  Genitourinary:    Comments: Hx of reported protruded rectum was reduced on its own, a external hemorrhoid at 7pm, no s/s of injury.  C/o burning sensation upon urination, poor historian.  Musculoskeletal:        General: Tenderness present.     Cervical back: Normal range of motion and neck supple.     Right lower leg: Edema present.     Left lower leg: Edema present.     Comments: Trace edema BLE.  Arthritic changes in  fingers, R>L. The R 3rd finger mid knuckle swelling, no heat or redness. Chronic lower back pain travels to buttocks/thighs  Skin:    General: Skin is warm and dry.  Neurological:     General: No focal deficit present.     Mental Status: She is alert. Mental status is at baseline.     Gait: Gait abnormal.     Comments: Oriented to person, place. Ambulates with walker.   Psychiatric:     Comments: Visual hallucination vs delusion is chronic, progressing.      Labs reviewed: Recent Labs    09/25/22 0000 01/01/23 0630  NA 143 140  K 4.4 4.3  CL 107 107  CO2 28* 25*  BUN 17 24*  CREATININE 1.0 1.0  CALCIUM 10.1 9.3   Recent Labs    09/25/22 0000 01/01/23 0630  AST 24 23  ALT 14 13  ALKPHOS 76 67  ALBUMIN 4.2 3.7   Recent Labs    09/25/22 0000 12/13/22 0630 01/01/23 0630  WBC 6.7 6.5 7.9  NEUTROABS  --  3,569.00 5,246.00  HGB 11.4* 10.6* 9.0*  HCT 35* 32* 27*  PLT 288 259 225   Lab Results  Component Value Date   TSH 2.21 09/25/2022   No results found for: "HGBA1C" Lab Results  Component Value Date   CHOL 137 01/11/2022   HDL 47 01/11/2022   LDLCALC 64 01/11/2022   TRIG 184 (A) 01/11/2022   CHOLHDL 2.4 07/14/2019    Significant Diagnostic Results in last 30 days:  No results found.  Assessment/Plan (HFpEF) heart failure with preserved ejection fraction (HCC) CHF/trace edema BLE, takes Furosemide, cardiology, severe aortic valve stenosis. Echocardiogram 11/15/20 EF 60-65%. Bun/creat 24/1.0 01/01/23, weight stable.   Hypercholesterolemia LDL 64 01/11/22, off Rosuvastatin.   Anemia  off Fe, ASA, Hgb 9.0 01/01/23  Severe aortic stenosis - not a candidate for TAVR Cardiology palliative approach. On Midodrine per Cardiology. More noticeable DOE, fatigue. under hospice service.   Erosive (osteo)arthritis , chronic lower back pain, travels to buttock and thighs,  aches and pains,  takes Plaquenil, Tylenol  f/u Rheumatology.   Hypothyroidism  takes  Levothyroxine TSH 2.21 09/25/22  Chronic constipation Stable,  takes Senokot S, Amitiza, Colace, MiraLax   Hallucinations, visual Better,  takes Sertraline,  Quetiapine. Change Tramadol to prn helped. TSH 2.21 09/25/22  Restless leg syndrome  stable on MiraPex, Sinemet hs, better sleep at night.   Orthostasis  on Midodrine.   Parkinson's disease dementia (HCC) takes Sinemet, MiraPex f/u  Dr. Arbutus Leas, w/c for mobility most of time. progressing, SNF [facility] for supportive care.  MMSE 15/30 11/28/22, under Hospice care.   GERD (gastroesophageal reflux disease) takes Pantoprazole, Famotidine, Reglan, yearly FOBT per GI, Hgb 9.0 01/01/23  Renal lithiasis Hx of rena lithiasis, recurrent UTI, urosepsis, treated by ID. 09/19/21 cystoscopy, laser lithotripsy, R ureteral stent/dilatation. Occasional dysuria present.  07/23/23 staff reported the patient c/o dysuria, the patient denied suprapubic pain/pressure, poor historian/unreliable HPI, afebrile, will obtain CBC/diff, CMP/eGFR to evaluate further. Observe.   Recurrent UTI 07/24/23 urine culture Proteus Mirabilis >100,000c/ml, Cipro 250mg  bid x 7days     Family/ staff Communication: plan of care reviewed with the patient and charge nurse.   Labs/tests ordered: CBC/diff, CMP/eGFR  Time spend 25 minutes.

## 2023-07-23 NOTE — Assessment & Plan Note (Signed)
takes Levothyroxine TSH 2.21 09/25/22 

## 2023-07-23 NOTE — Assessment & Plan Note (Signed)
stable on MiraPex, Sinemet hs, better sleep at night.  

## 2023-07-23 NOTE — Assessment & Plan Note (Signed)
takes Sinemet, MiraPex f/u Dr. Arbutus Leas, w/c for mobility most of time. progressing, SNF [facility] for supportive care.  MMSE 15/30 11/28/22, under Hospice care.

## 2023-07-23 NOTE — Assessment & Plan Note (Signed)
Cardiology palliative approach. On Midodrine per Cardiology. More noticeable DOE, fatigue. under hospice service.

## 2023-07-23 NOTE — Assessment & Plan Note (Signed)
Better,  takes Sertraline,  Quetiapine. Change Tramadol to prn helped. TSH 2.21 09/25/22

## 2023-07-23 NOTE — Assessment & Plan Note (Signed)
 takes Pantoprazole, Famotidine, Reglan, yearly FOBT per GI, Hgb 9.0 01/01/23

## 2023-07-23 NOTE — Assessment & Plan Note (Signed)
LDL 64 01/11/22, off Rosuvastatin.  

## 2023-07-25 ENCOUNTER — Encounter: Payer: Self-pay | Admitting: Nurse Practitioner

## 2023-07-29 NOTE — Assessment & Plan Note (Signed)
07/24/23 urine culture Proteus Mirabilis >100,000c/ml, Cipro 250mg  bid x 7days

## 2023-08-09 ENCOUNTER — Encounter: Payer: Self-pay | Admitting: Nurse Practitioner

## 2023-08-09 ENCOUNTER — Non-Acute Institutional Stay: Payer: Self-pay | Admitting: Nurse Practitioner

## 2023-08-09 DIAGNOSIS — N200 Calculus of kidney: Secondary | ICD-10-CM | POA: Diagnosis not present

## 2023-08-09 DIAGNOSIS — E039 Hypothyroidism, unspecified: Secondary | ICD-10-CM

## 2023-08-09 DIAGNOSIS — E7800 Pure hypercholesterolemia, unspecified: Secondary | ICD-10-CM

## 2023-08-09 DIAGNOSIS — K219 Gastro-esophageal reflux disease without esophagitis: Secondary | ICD-10-CM | POA: Diagnosis not present

## 2023-08-09 DIAGNOSIS — R300 Dysuria: Secondary | ICD-10-CM

## 2023-08-09 DIAGNOSIS — I951 Orthostatic hypotension: Secondary | ICD-10-CM

## 2023-08-09 DIAGNOSIS — I5032 Chronic diastolic (congestive) heart failure: Secondary | ICD-10-CM

## 2023-08-09 DIAGNOSIS — G2581 Restless legs syndrome: Secondary | ICD-10-CM

## 2023-08-09 DIAGNOSIS — G3184 Mild cognitive impairment, so stated: Secondary | ICD-10-CM | POA: Diagnosis not present

## 2023-08-09 DIAGNOSIS — F323 Major depressive disorder, single episode, severe with psychotic features: Secondary | ICD-10-CM

## 2023-08-09 DIAGNOSIS — I350 Nonrheumatic aortic (valve) stenosis: Secondary | ICD-10-CM

## 2023-08-09 DIAGNOSIS — K5909 Other constipation: Secondary | ICD-10-CM

## 2023-08-09 DIAGNOSIS — M154 Erosive (osteo)arthritis: Secondary | ICD-10-CM

## 2023-08-09 DIAGNOSIS — F02B2 Parkinson's disease without dyskinesia, without mention of fluctuations: Secondary | ICD-10-CM

## 2023-08-09 DIAGNOSIS — G20A1 Parkinson's disease without dyskinesia, without mention of fluctuations: Secondary | ICD-10-CM

## 2023-08-09 NOTE — Assessment & Plan Note (Signed)
Stable, takes Senokot S, Amitiza, Colace, MiraLax  

## 2023-08-09 NOTE — Assessment & Plan Note (Signed)
 progressing, SNF [facility] for supportive care.  MMSE 15/30 11/28/22, under Hospice care.

## 2023-08-09 NOTE — Assessment & Plan Note (Signed)
stable on MiraPex, Sinemet hs, better sleep at night.  

## 2023-08-09 NOTE — Assessment & Plan Note (Signed)
chronic lower back pain, travels to buttock and thighs,  aches and pains,  takes Plaquenil, Tylenol  f/u Rheumatology.

## 2023-08-09 NOTE — Progress Notes (Signed)
This encounter was created in error - please disregard.

## 2023-08-09 NOTE — Assessment & Plan Note (Signed)
takes Sinemet, MiraPex f/u Dr. Tat, w/c for mobility most of time.  

## 2023-08-09 NOTE — Progress Notes (Signed)
Location:   SNF [facility] Room Number: 45 Place of Service:  SNF (31) Provider: Arna Snipe Laurielle Selmon NP  Halie Gass X, NP  Patient Care Team: Dominyck Reser X, NP as PCP - General (Internal Medicine) Quintella Reichert, MD as PCP - Cardiology (Cardiology) Arman Bogus, MD (Neurosurgery) Suanne Marker, MD (Neurology) Carman Ching, MD (Inactive) as Consulting Physician (Gastroenterology) Berneice Heinrich, Delbert Phenix., MD as Consulting Physician (Urology) Jetta Murray X, NP as Nurse Practitioner (Internal Medicine) Tat, Octaviano Batty, DO as Consulting Physician (Neurology)  Extended Emergency Contact Information Primary Emergency Contact: [facility] Address: [address]          [place], [place] [address] D[place] of [place] Home Phone: [phone] Mobile Phone: [phone] Relation: Spouse Secondary Emergency Contact: Abbey Chatters Mobile Phone: [phone] Relation: Daughter Preferred language: [place] Interpreter needed? No  Code Status: DNR Goals of care: Advanced Directive information    07/23/2023    9:07 AM  Advanced Directives  Does Patient Have a Medical Advance Directive? Yes  Type of Estate agent of [place];Living will;Out of facility DNR (pink MOST or yellow form)  Does patient want to make changes to medical advance directive? No - Patient declined  Copy of Healthcare Power of Attorney in Chart? Yes - validated most recent copy scanned in chart (See row information)  Pre-existing out of facility DNR order (yellow form or pink MOST form) Yellow form placed in chart (order not valid for inpatient use);Pink MOST form placed in chart (order not valid for inpatient use)     Chief Complaint  Patient presents with   Acute Visit    dysuria    HPI:  Pt is a 89 y.o. female seen today for an acute visit for painful urination, chronic, treated UTI did not help. Denied abd pain, nausea, vomiting, or increased lower back, she is  afebrile, in her usual state of health.  Memory loss, progressing, SNF [facility] for supportive care.  MMSE 15/30 11/28/22, under Hospice care.              Dry eyes, uses artificial tears.  Prolapsed rectum, avoid constipation, usually reduced on its own.              Hx of rena lithiasis, recurrent UTI, urosepsis, treated by ID. 09/19/21 cystoscopy, laser lithotripsy, R ureteral stent/dilatation. Occasional dysuria present.              GERD, takes Pantoprazole, Famotidine, Reglan, yearly FOBT per GI, Hgb 9.0 01/01/23             Parkinson's, takes Sinemet, MiraPex f/u Dr. Arbutus Leas, w/c for mobility most of time.              Hypotension, on Midodrine.              RLS stable on MiraPex, Sinemet hs, better sleep at night.              Depression/hallucination,  takes Sertraline,  Quetiapine. Change Tramadol to prn helped. TSH 2.21 09/25/22             Constipation, takes Senokot S, Amitiza, Colace, MiraLax              Hypothyroidism, takes Levothyroxine TSH 2.21 09/25/22             Erosive arthritis, chronic lower back pain, travels to buttock and thighs,  aches and pains,  takes Plaquenil, Tylenol  f/u Rheumatology.  CHF/trace edema BLE, takes Furosemide, cardiology, severe aortic valve stenosis. Echocardiogram 11/15/20 EF 60-65%. Bun/creat 24/1.0 01/01/23, weight stable.              Hyperlipidemia, LDL 64 01/11/22, off Rosuvastatin.              Anemia, off Fe, ASA, Hgb 9.0 01/01/23             AS/CAD Cardiology palliative approach. On Midodrine per Cardiology. More noticeable DOE, fatigue. under hospice service.       Past Medical History:  Diagnosis Date   Abnormal nuclear stress test    Actinic cheilitis 04/23/2019   Anemia    iron deficiency    Anxiety    Aortic stenosis    severe by echo 2021    Arthritis    Broken arm    right    Chronic diastolic CHF (congestive heart failure) (HCC)    Chronic pain    Cognitive communication deficit    Cognitive deficits    Communication    Constipation    Depression    Erosive (osteo)arthritis 03/09/2020   Family history of adverse reaction to anesthesia    pt. states sister vomits   Fibromyalgia    GERD (gastroesophageal reflux disease)    H/O hiatal hernia    Hallucinations, visual 07/21/2018   03/10/20 psych consult.    Hematuria 05/18/2020   History of bronchitis    History of kidney stones    Hypercholesterolemia    Hypertension    dr t turner   Hypothyroidism    Memory loss 04/21/2018   08/08/18 CT head no traumatic findings, atrophy.  09/27/19 MMSE 25/30, failed the clock drawing   Microhematuria 03/24/2015   Mild CAD    Neuropathy    Osteoporosis    Parkinson disease    PVD (peripheral vascular disease) (HCC)    99% stenosis of left anteiror tibial artery, mod stenosis of left distal SFA and popliteal artery followed by Dr. Darrick Penna   RBBB    noted on EKG 2018   Restless leg syndrome    Sepsis (HCC)    hx of due to Ecoli   Septic shock (HCC) 04/19/2020   Shortness of breath    with exertion - chronic    Sjogren's disease (HCC)    Sjogren's syndrome (HCC) 02/04/2018   04/16/19 rheumatology: Erosive OA of hands, Sjogrens syndrome, low back pain at multiple sites, recurrent kidney stones, age related osteoporosis w/o current pathological fracture, f/u 6 months.    SOB (shortness of breath)    chronic due to diastolic dysfunction, deconditioning, obesity   Spinal stenosis    lumbar region    Tremor    Unstable gait 04/21/2018   Unsteadiness on feet    Urinary frequency 09/18/2018   Urinary tract infection    Visual hallucinations    Past Surgical History:  Procedure Laterality Date   ABDOMINAL HYSTERECTOMY     BACK SURGERY     4 back surgeries,   lumbar fusion   CARDIAC CATHETERIZATION  2006   normal   CYSTOSCOPY/URETEROSCOPY/HOLMIUM LASER/STENT PLACEMENT Left 01/06/2019   Procedure: CYSTOSCOPY/RETROGRADE/URETEROSCOPY/HOLMIUM LASER/BASKET RETRIEVAL/STENT PLACEMENT;  Surgeon: Rene Paci, MD;  Location: [facility];  Service: Urology;  Laterality: Left;   CYSTOSCOPY/URETEROSCOPY/HOLMIUM LASER/STENT PLACEMENT Bilateral 05/24/2020   Procedure: CYSTOSCOPY/RETROGRADE/URETEROSCOPY/HOLMIUM LASER/STENT PLACEMENT;  Surgeon: Rene Paci, MD;  Location: [facility];  Service: Urology;  Laterality: Bilateral;   CYSTOSCOPY/URETEROSCOPY/HOLMIUM LASER/STENT PLACEMENT Right 09/27/2021   Procedure: CYSTOSCOPY/BILATERAL RETROGRADE/URETEROSCOPY/HOLMIUM LASER/STENT  PLACEMENT;  Surgeon: Rene Paci, MD;  Location: [facility];  Service: Urology;  Laterality: Right;  ONLY NEEDS 60 MIN   EYE SURGERY Bilateral    cateracts   falls     variious fall, broken wrist,and toes   FRACTURE SURGERY Right April 2016   Wrist, Pt. fell   IR NEPHROSTOMY PLACEMENT RIGHT  04/19/2020   kidney stone removal Left 01/20/2019   RIGHT/LEFT HEART CATH AND CORONARY ANGIOGRAPHY N/A 02/05/2018   Procedure: RIGHT/LEFT HEART CATH AND CORONARY ANGIOGRAPHY;  Surgeon: Lennette Bihari, MD;  Location: [facility];  Service: Cardiovascular;  Laterality: N/A;   SPINAL CORD STIMULATOR INSERTION N/A 09/09/2015   Procedure: LUMBAR SPINAL CORD STIMULATOR INSERTION;  Surgeon: Odette Fraction, MD;  Location: [facility];  Service: Neurosurgery;  Laterality: N/A;  LUMBAR SPINAL CORD STIMULATOR INSERTION   SPINE SURGERY  April 2013   Back X's 4   TOTAL HIP ARTHROPLASTY     right   WRIST FRACTURE SURGERY Bilateral     Allergies  Allergen Reactions   Phosphate     Other reaction(s): Unknown   Adrenalone    Codeine Other (See Comments)    Reaction:  Headaches and nightmares  Other reaction(s): Unknown   Lactose Other (See Comments)    abd pain, lactose intolerant   Lactose Intolerance (Gi) Nausea And Vomiting   Latex Rash   Lyrica [Pregabalin] Swelling and Other (See Comments)    Reaction:  Leg swelling   Morphine Other (See Comments)   Other Other (See Comments)    Pt states that pain medications give her  nightmares.     Plaquenil [Hydroxychloroquine Sulfate] Other (See Comments)    Reaction:  GI upset    Reglan [Metoclopramide] Other (See Comments)    Reaction:  GI upset    Requip [Ropinirole Hcl] Other (See Comments)    Reaction:  GI upset    Septra [Sulfamethoxazole-Trimethoprim] Nausea And Vomiting   Shellfish Allergy Nausea And Vomiting   Sulfa Antibiotics Other (See Comments)    Headache, very sick    Allergies as of 08/09/2023       Reactions   Phosphate    Other reaction(s): Unknown   Adrenalone    Codeine Other (See Comments)   Reaction:  Headaches and nightmares  Other reaction(s): Unknown   Lactose Other (See Comments)   abd pain, lactose intolerant   Lactose Intolerance (gi) Nausea And Vomiting   Latex Rash   Lyrica [pregabalin] Swelling, Other (See Comments)   Reaction:  Leg swelling   Morphine Other (See Comments)   Other Other (See Comments)   Pt states that pain medications give her nightmares.     Plaquenil [hydroxychloroquine Sulfate] Other (See Comments)   Reaction:  GI upset    Reglan [metoclopramide] Other (See Comments)   Reaction:  GI upset    Requip [ropinirole Hcl] Other (See Comments)   Reaction:  GI upset    Septra [sulfamethoxazole-trimethoprim] Nausea And Vomiting   Shellfish Allergy Nausea And Vomiting   Sulfa Antibiotics Other (See Comments)   Headache, very sick        Medication List        Accurate as of August 09, 2023  4:50 PM. If you have any questions, ask your nurse or doctor.          acetaminophen 500 MG tablet Commonly known as: TYLENOL Take 500 mg by mouth 3 (three) times daily.   acetaminophen 325 MG tablet Commonly known as:  TYLENOL Take 650 mg by mouth every 4 (four) hours as needed. Do not exceed 3000 mg of acetaminophen in 24 hours   carbidopa-levodopa 25-100 MG tablet Commonly known as: SINEMET IR Take 1 tablet by mouth 3 (three) times daily.   Carbidopa-Levodopa ER 25-100 MG tablet controlled  release Commonly known as: SINEMET CR Take 1 tablet by mouth at bedtime. See other listing   docusate sodium 100 MG capsule Commonly known as: COLACE Take 100 mg by mouth at bedtime.   famotidine 20 MG tablet Commonly known as: PEPCID Take 20 mg by mouth daily.   furosemide 20 MG tablet Commonly known as: LASIX Take 1 tablet (20 mg total) by mouth daily.   hydroxychloroquine 200 MG tablet Commonly known as: PLAQUENIL Take 200 mg by mouth daily.   lactose free nutrition Liqd Take 237 mLs by mouth daily.   levothyroxine 50 MCG tablet Commonly known as: SYNTHROID Take 50 mcg by mouth daily before breakfast.   lubiprostone 24 MCG capsule Commonly known as: AMITIZA Take 24 mcg by mouth 2 (two) times daily with a meal.   midodrine 5 MG tablet Commonly known as: PROAMATINE Take 1 tablet (5 mg total) by mouth 3 (three) times daily with meals.   nystatin powder Apply 1 Application topically daily. ABDOMINAL SKIN FOLD, UNDER BREAST AND GROIN   pantoprazole 40 MG tablet Commonly known as: PROTONIX Take 40 mg by mouth daily.   polyethylene glycol 17 g packet Commonly known as: MIRALAX / GLYCOLAX Take 17 g by mouth daily.   potassium citrate 10 MEQ (1080 MG) SR tablet Commonly known as: UROCIT-K Take 10 mEq by mouth 2 (two) times daily.   pramipexole 0.125 MG tablet Commonly known as: MIRAPEX Take 0.125 mg by mouth at bedtime.   PRESERVISION AREDS 2 PO Take 1 tablet by mouth daily.   QUEtiapine 25 MG tablet Commonly known as: SEROQUEL Take 25 mg by mouth daily. See other listing   QUEtiapine 50 MG tablet Commonly known as: SEROQUEL Take 50 mg by mouth daily. See other listing   senna-docusate 8.6-50 MG tablet Commonly known as: Senokot-S Take 1 tablet by mouth at bedtime.   sertraline 100 MG tablet Commonly known as: ZOLOFT Take 100 mg by mouth at bedtime. With 25 mg tablet to equal 125 mg   sertraline 25 MG tablet Commonly known as: ZOLOFT Take 25 mg by  mouth at bedtime. Along with 100 mg tablet to equal 125 mg   Systane 0.4-0.3 % Soln Generic drug: Polyethyl Glycol-Propyl Glycol Apply 1 drop to eye 2 (two) times daily as needed (dry eyes).        Review of Systems  Unable to perform ROS: Dementia    Immunization History  Administered Date(s) Administered   Hepatitis A, Adult 03/01/1997, 09/20/1997   Influenza Split 09/09/2013, 09/27/2014   Influenza, High Dose Seasonal PF 08/01/2011, 08/27/2012, 10/14/2013, 09/08/2015, 09/10/2016, 09/12/2017, 09/12/2018, 09/15/2019, 10/03/2022   Influenza,inj,Quad PF,6+ Mos 09/11/2016   Influenza-Unspecified 09/21/2020, 09/28/2021, 10/03/2022   Moderna SARS-COV2 Booster Vaccination 04/27/2022, 10/11/2022   Moderna Sars-Covid-2 Vaccination 12/12/2019, 01/09/2020, 10/18/2020, 05/09/2021, 08/29/2021   Pneumococcal Conjugate-13 04/15/2014   Pneumococcal Polysaccharide-23 09/28/2005   Td 06/20/2016   Tdap 04/29/2006, 09/01/2015   Zoster Recombinant(Shingrix) 06/11/2018, 09/10/2018   Zoster, Live 04/29/2006   Pertinent  Health Maintenance Due  Topic Date Due   INFLUENZA VACCINE  07/11/2023   DEXA SCAN  Completed      09/25/2022    2:40 PM 09/28/2022   11:43 AM 11/27/2022  10:40 AM 01/22/2023   11:45 AM 03/05/2023    8:29 AM  Fall Risk  Falls in the past year? 0 0 0 0 0  Was there an injury with Fall?  0 0 0 0  Fall Risk Category Calculator  0 0 0 0  Fall Risk Category (Retired)  Low Low    (RETIRED) Patient Fall Risk Level   Low fall risk    Patient at Risk for Falls Due to  History of fall(s) History of fall(s) History of fall(s) History of fall(s)  Fall risk Follow up  Falls evaluation completed Falls evaluation completed Falls evaluation completed Falls evaluation completed   Functional Status Survey:    Vitals:   08/09/23 1637  BP: 132/82  Pulse: 64  Resp: 16  Temp: (!) 97.3 F (36.3 C)  SpO2: 97%  Weight: 115 lb 4.8 oz (52.3 kg)   Body mass index is 25.85  kg/m. Physical Exam Vitals and nursing note reviewed.  Constitutional:      Appearance: Normal appearance.  HENT:     Head: Normocephalic and atraumatic.     Nose: Nose normal.     Mouth/Throat:     Mouth: Mucous membranes are moist.  Eyes:     Extraocular Movements: Extraocular movements intact.     Conjunctiva/sclera: Conjunctivae normal.     Pupils: Pupils are equal, round, and reactive to light.  Cardiovascular:     Rate and Rhythm: Normal rate and regular rhythm.     Heart sounds: Murmur heard.  Pulmonary:     Effort: Pulmonary effort is normal.     Breath sounds: No rales.  Abdominal:     General: Bowel sounds are normal.     Palpations: Abdomen is soft.     Tenderness: There is no abdominal tenderness.  Genitourinary:    Comments: Hx of reported protruded rectum was reduced on its own, a external hemorrhoid at 7pm, no s/s of injury.  C/o burning sensation upon urination, poor historian.  Musculoskeletal:        General: Tenderness present.     Cervical back: Normal range of motion and neck supple.     Right lower leg: Edema present.     Left lower leg: Edema present.     Comments: Trace edema BLE.  Arthritic changes in fingers, R>L. The R 3rd finger mid knuckle swelling, no heat or redness. Chronic lower back pain travels to buttocks/thighs  Skin:    General: Skin is warm and dry.  Neurological:     General: No focal deficit present.     Mental Status: She is alert. Mental status is at baseline.     Gait: Gait abnormal.     Comments: Oriented to person, place. Ambulates with walker.   Psychiatric:     Comments: Visual hallucination vs delusion is chronic, progressing.      Labs reviewed: Recent Labs    09/25/22 0000 01/01/23 0630  NA 143 140  K 4.4 4.3  CL 107 107  CO2 28* 25*  BUN 17 24*  CREATININE 1.0 1.0  CALCIUM 10.1 9.3   Recent Labs    09/25/22 0000 01/01/23 0630  AST 24 23  ALT 14 13  ALKPHOS 76 67  ALBUMIN 4.2 3.7   Recent Labs     09/25/22 0000 12/13/22 0630 01/01/23 0630  WBC 6.7 6.5 7.9  NEUTROABS  --  3,569.00 5,246.00  HGB 11.4* 10.6* 9.0*  HCT 35* 32* 27*  PLT 288 259 225  Lab Results  Component Value Date   TSH 2.21 09/25/2022   No results found for: "HGBA1C" Lab Results  Component Value Date   CHOL 137 01/11/2022   HDL 47 01/11/2022   LDLCALC 64 01/11/2022   TRIG 184 (A) 01/11/2022   CHOLHDL 2.4 07/14/2019    Significant Diagnostic Results in last 30 days:  No results found.  Assessment/Plan: Dysuria painful urination, chronic, treated UTI did not help. Denied abd pain, nausea, vomiting, or increased lower back, she is afebrile, in her usual state of health.  Pyridium 100mg  tid po x2 days, prn Lidocaine get bid to urethra.   Mild cognitive impairment  progressing, SNF [facility] for supportive care.  MMSE 15/30 11/28/22, under Hospice care.   Renal lithiasis recurrent UTI, urosepsis, treated by ID. 09/19/21 cystoscopy, laser lithotripsy, R ureteral stent/dilatation. Occasional dysuria present.   GERD (gastroesophageal reflux disease) takes Pantoprazole, Famotidine, Reglan, yearly FOBT per GI, Hgb 9.0 01/01/23  Parkinson's disease dementia (HCC) takes Sinemet, MiraPex f/u Dr. Arbutus Leas, w/c for mobility most of time.   Orthostasis  on Midodrine.   Restless leg syndrome stable on MiraPex, Sinemet hs, better sleep at night.   Depression, psychotic (HCC)  takes Sertraline,  Quetiapine. Change Tramadol to prn helped. TSH 2.21 09/25/22  Chronic constipation Stable, takes Senokot S, Amitiza, Colace, MiraLax   Hypothyroidism  takes Levothyroxine TSH 2.21 09/25/22  Erosive (osteo)arthritis  chronic lower back pain, travels to buttock and thighs,  aches and pains,  takes Plaquenil, Tylenol  f/u Rheumatology.   (HFpEF) heart failure with preserved ejection fraction (HCC) CHF/trace edema BLE, takes Furosemide, cardiology, severe aortic valve stenosis. Echocardiogram 11/15/20 EF 60-65%. Bun/creat  24/1.0 01/01/23, weight stable.   Hypercholesterolemia LDL 64 01/11/22, off Rosuvastatin.     Family/ staff Communication: plan of care reviewed with the patient, the patient's husband, and charge nurse.   Labs/tests ordered:  none  Time spend 35 minutes

## 2023-08-09 NOTE — Assessment & Plan Note (Signed)
takes Sertraline,  Quetiapine. Change Tramadol to prn helped. TSH 2.21 09/25/22

## 2023-08-09 NOTE — Assessment & Plan Note (Signed)
takes Levothyroxine TSH 2.21 09/25/22 

## 2023-08-09 NOTE — Assessment & Plan Note (Signed)
LDL 64 01/11/22, off Rosuvastatin.  

## 2023-08-09 NOTE — Assessment & Plan Note (Signed)
painful urination, chronic, treated UTI did not help. Denied abd pain, nausea, vomiting, or increased lower back, she is afebrile, in her usual state of health.  Pyridium 100mg  tid po x2 days, prn Lidocaine get bid to urethra.

## 2023-08-09 NOTE — Assessment & Plan Note (Signed)
 CHF/trace edema BLE, takes Furosemide, cardiology, severe aortic valve stenosis. Echocardiogram 11/15/20 EF 60-65%. Bun/creat 24/1.0 01/01/23, weight stable.

## 2023-08-09 NOTE — Assessment & Plan Note (Signed)
 takes Pantoprazole, Famotidine, Reglan, yearly FOBT per GI, Hgb 9.0 01/01/23

## 2023-08-09 NOTE — Assessment & Plan Note (Signed)
 Cardiology palliative approach. On Midodrine per Cardiology. More noticeable DOE, fatigue. under hospice service.

## 2023-08-09 NOTE — Assessment & Plan Note (Signed)
on Midodrine 

## 2023-08-09 NOTE — Assessment & Plan Note (Signed)
recurrent UTI, urosepsis, treated by ID. 09/19/21 cystoscopy, laser lithotripsy, R ureteral stent/dilatation. Occasional dysuria present.

## 2023-08-26 ENCOUNTER — Non-Acute Institutional Stay (SKILLED_NURSING_FACILITY): Admitting: Sports Medicine

## 2023-08-26 DIAGNOSIS — F323 Major depressive disorder, single episode, severe with psychotic features: Secondary | ICD-10-CM

## 2023-08-26 DIAGNOSIS — I959 Hypotension, unspecified: Secondary | ICD-10-CM

## 2023-08-26 DIAGNOSIS — R300 Dysuria: Secondary | ICD-10-CM

## 2023-08-26 DIAGNOSIS — G20A1 Parkinson's disease without dyskinesia, without mention of fluctuations: Secondary | ICD-10-CM

## 2023-08-26 DIAGNOSIS — G2581 Restless legs syndrome: Secondary | ICD-10-CM

## 2023-08-26 DIAGNOSIS — M154 Erosive (osteo)arthritis: Secondary | ICD-10-CM

## 2023-08-26 DIAGNOSIS — R413 Other amnesia: Secondary | ICD-10-CM

## 2023-08-26 DIAGNOSIS — E039 Hypothyroidism, unspecified: Secondary | ICD-10-CM

## 2023-08-26 DIAGNOSIS — K219 Gastro-esophageal reflux disease without esophagitis: Secondary | ICD-10-CM

## 2023-08-26 DIAGNOSIS — I350 Nonrheumatic aortic (valve) stenosis: Secondary | ICD-10-CM

## 2023-08-26 DIAGNOSIS — I5032 Chronic diastolic (congestive) heart failure: Secondary | ICD-10-CM

## 2023-08-26 NOTE — Progress Notes (Unsigned)
Provider:  Venita Sheffield MD Location:   [facility]   Place of Service:   [address]  PCP: [name], Man X, NP Patient Care Team: [name], Man X, NP as PCP - General (Internal Medicine) Quintella Reichert, MD as PCP - Cardiology (Cardiology) Arman Bogus, MD (Neurosurgery) Suanne Marker, MD (Neurology) Carman Ching, MD (Inactive) as Consulting Physician (Gastroenterology) Berneice Heinrich Delbert Phenix., MD as Consulting Physician (Urology) [name], Man X, NP as Nurse Practitioner (Internal Medicine) Tat, Octaviano Batty, DO as Consulting Physician (Neurology)  Extended Emergency Contact Information Primary Emergency Contact: [facility] Address: [address]          [place], [place] [address] [place] of [place] Home Phone: [phone] Mobile Phone: [phone] Relation: Spouse Secondary Emergency Contact: Abbey Chatters Mobile Phone: [phone] Relation: Daughter Preferred language: [place] Interpreter needed? No  Code Status:  Goals of Care: Advanced Directive information    07/23/2023    9:07 AM  Advanced Directives  Does Patient Have a Medical Advance Directive? Yes  Type of Estate agent of [place];Living will;Out of facility DNR (pink MOST or yellow form)  Does patient want to make changes to medical advance directive? No - Patient declined  Copy of Healthcare Power of Attorney in Chart? Yes - validated most recent copy scanned in chart (See row information)  Pre-existing out of facility DNR order (yellow form or pink MOST form) Yellow form placed in chart (order not valid for inpatient use);Pink MOST form placed in chart (order not valid for inpatient use)      No chief complaint on file.   HPI: Patient is a 89 y.o. female seen today for routine follow up visit  As per nursing staff  Pt is declining and requiring more assistance with her ADLS. She consumes 50% of her meals, no recent change in her weigh, she drinks  boost. She is on regular diet with bite size meat and small portions.  She participates in some activities and endorses feeling sad, tired and not eating well. Currently on hospice. Pt seen and examined in her room , she is sitting comfortably in her wheel chair She knows her name, not oriented to time or place. States she is her living room and her grand kids are next door playing with their cars. Does not appear to be in distress No agitation as per nursing staff  She could remember what she had for lunch     Past Medical History:  Diagnosis Date   Abnormal nuclear stress test    Actinic cheilitis 04/23/2019   Anemia    iron deficiency    Anxiety    Aortic stenosis    severe by echo 2021    Arthritis    Broken arm    right    Chronic diastolic CHF (congestive heart failure) (HCC)    Chronic pain    Cognitive communication deficit    Cognitive deficits    Communication   Constipation    Depression    Erosive (osteo)arthritis 03/09/2020   Family history of adverse reaction to anesthesia    pt. states sister vomits   Fibromyalgia    GERD (gastroesophageal reflux disease)    H/O hiatal hernia    Hallucinations, visual 07/21/2018   03/10/20 psych consult.    Hematuria 05/18/2020   History of bronchitis    History of kidney stones    Hypercholesterolemia    Hypertension    dr t turner   Hypothyroidism  Memory loss 04/21/2018   08/08/18 CT head no traumatic findings, atrophy.  09/27/19 MMSE 25/30, failed the clock drawing   Microhematuria 03/24/2015   Mild CAD    Neuropathy    Osteoporosis    Parkinson disease    PVD (peripheral vascular disease) (HCC)    99% stenosis of left anteiror tibial artery, mod stenosis of left distal SFA and popliteal artery followed by Dr. Darrick Penna   RBBB    noted on EKG 2018   Restless leg syndrome    Sepsis (HCC)    hx of due to Ecoli   Septic shock (HCC) 04/19/2020   Shortness of breath    with exertion - chronic    Sjogren's  disease (HCC)    Sjogren's syndrome (HCC) 02/04/2018   04/16/19 rheumatology: Erosive OA of hands, Sjogrens syndrome, low back pain at multiple sites, recurrent kidney stones, age related osteoporosis w/o current pathological fracture, f/u 6 months.    SOB (shortness of breath)    chronic due to diastolic dysfunction, deconditioning, obesity   Spinal stenosis    lumbar region    Tremor    Unstable gait 04/21/2018   Unsteadiness on feet    Urinary frequency 09/18/2018   Urinary tract infection    Visual hallucinations    Past Surgical History:  Procedure Laterality Date   ABDOMINAL HYSTERECTOMY     BACK SURGERY     4 back surgeries,   lumbar fusion   CARDIAC CATHETERIZATION  2006   normal   CYSTOSCOPY/URETEROSCOPY/HOLMIUM LASER/STENT PLACEMENT Left 01/06/2019   Procedure: CYSTOSCOPY/RETROGRADE/URETEROSCOPY/HOLMIUM LASER/BASKET RETRIEVAL/STENT PLACEMENT;  Surgeon: Rene Paci, MD;  Location: [facility];  Service: Urology;  Laterality: Left;   CYSTOSCOPY/URETEROSCOPY/HOLMIUM LASER/STENT PLACEMENT Bilateral 05/24/2020   Procedure: CYSTOSCOPY/RETROGRADE/URETEROSCOPY/HOLMIUM LASER/STENT PLACEMENT;  Surgeon: Rene Paci, MD;  Location: [facility];  Service: Urology;  Laterality: Bilateral;   CYSTOSCOPY/URETEROSCOPY/HOLMIUM LASER/STENT PLACEMENT Right 09/27/2021   Procedure: CYSTOSCOPY/BILATERAL RETROGRADE/URETEROSCOPY/HOLMIUM LASER/STENT PLACEMENT;  Surgeon: Rene Paci, MD;  Location: [facility];  Service: Urology;  Laterality: Right;  ONLY NEEDS 60 MIN   EYE SURGERY Bilateral    cateracts   falls     variious fall, broken wrist,and toes   FRACTURE SURGERY Right April 2016   Wrist, Pt. fell   IR NEPHROSTOMY PLACEMENT RIGHT  04/19/2020   kidney stone removal Left 01/20/2019   RIGHT/LEFT HEART CATH AND CORONARY ANGIOGRAPHY N/A 02/05/2018   Procedure: RIGHT/LEFT HEART CATH AND CORONARY ANGIOGRAPHY;  Surgeon: Lennette Bihari, MD;  Location: [facility];   Service: Cardiovascular;  Laterality: N/A;   SPINAL CORD STIMULATOR INSERTION N/A 09/09/2015   Procedure: LUMBAR SPINAL CORD STIMULATOR INSERTION;  Surgeon: Odette Fraction, MD;  Location: [facility];  Service: Neurosurgery;  Laterality: N/A;  LUMBAR SPINAL CORD STIMULATOR INSERTION   SPINE SURGERY  April 2013   Back X's 4   TOTAL HIP ARTHROPLASTY     right   WRIST FRACTURE SURGERY Bilateral     reports that she has never smoked. She has never used smokeless tobacco. She reports that she does not drink alcohol and does not use drugs. Social History   Socioeconomic History   Marital status: Married    Spouse name: Jimmy Picket   Number of children: 2   Years of education: HS   Highest education level: Not on file  Occupational History    Comment: Homemaker  Tobacco Use   Smoking status: Never   Smokeless tobacco: Never  Vaping Use   Vaping status: Never  Used  Substance and Sexual Activity   Alcohol use: No    Alcohol/week: 0.0 standard drinks of alcohol   Drug use: No   Sexual activity: Never  Other Topics Concern   Not on file  Social History Narrative   [facility]. Married in 1957. Two people lives in the home, no pets.    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    Caffeine Use: tea   Social Determinants of Health   Financial Resource Strain: Low Risk  (10/27/2018)   Overall Financial Resource Strain (CARDIA)    Difficulty of Paying Living Expenses: Not hard at all  Food Insecurity: No Food Insecurity (10/27/2018)   Hunger Vital Sign    Worried About Running Out of Food in the Last Year: Never true    Ran Out of Food in the Last Year: Never true  Transportation Needs: No Transportation Needs (10/27/2018)   PRAPARE - Administrator, Civil Service (Medical): No    Lack of Transportation (Non-Medical): No  Physical Activity: Insufficiently Active (10/27/2018)   Exercise Vital  Sign    Days of Exercise per Week: 2 days    Minutes of Exercise per Session: 30 min  Stress: No Stress Concern Present (10/27/2018)   [employer] of Occupational Health - Occupational Stress Questionnaire    Feeling of Stress : Only a little  Social Connections: Socially Integrated (10/27/2018)   Social Connection and Isolation Panel [NHANES]    Frequency of Communication with Friends and Family: More than three times a week    Frequency of Social Gatherings with Friends and Family: More than three times a week    Attends Religious Services: More than 4 times per year    Active Member of Clubs or Organizations: Yes    Attends Banker Meetings: More than 4 times per year    Marital Status: Married  Catering manager Violence: Not At Risk (10/27/2018)   Humiliation, Afraid, Rape, and Kick questionnaire    Fear of Current or Ex-Partner: No    Emotionally Abused: No    Physically Abused: No    Sexually Abused: No    Functional Status Survey:    Family History  Problem Relation Age of Onset   Heart attack Mother    Restless legs syndrome Mother    Heart failure Mother    Heart disease Mother    Hypertension Mother    COPD Father     Health Maintenance  Topic Date Due   INFLUENZA VACCINE  07/11/2023   COVID-19 Vaccine (6 - 2023-24 season) 08/11/2023   DTaP/Tdap/Td (4 - Td or Tdap) 06/20/2026   Pneumonia Vaccine 54+ Years old  Completed   DEXA SCAN  Completed   Zoster Vaccines- Shingrix  Completed   HPV VACCINES  Aged Out    Allergies  Allergen Reactions   Phosphate     Other reaction(s): Unknown   Adrenalone    Codeine Other (See Comments)    Reaction:  Headaches and nightmares  Other reaction(s): Unknown   Lactose Other (See Comments)    abd pain, lactose intolerant   Lactose Intolerance (Gi) Nausea And Vomiting   Latex Rash   Lyrica [Pregabalin] Swelling and Other (See Comments)    Reaction:  Leg swelling   Morphine Other (See Comments)    Other Other (See Comments)    Pt states that pain medications give her  nightmares.     Plaquenil [Hydroxychloroquine Sulfate] Other (See Comments)    Reaction:  GI upset    Reglan [Metoclopramide] Other (See Comments)    Reaction:  GI upset    Requip [Ropinirole Hcl] Other (See Comments)    Reaction:  GI upset    Septra [Sulfamethoxazole-Trimethoprim] Nausea And Vomiting   Shellfish Allergy Nausea And Vomiting   Sulfa Antibiotics Other (See Comments)    Headache, very sick    Outpatient Encounter Medications as of 08/26/2023  Medication Sig   acetaminophen (TYLENOL) 325 MG tablet Take 650 mg by mouth every 4 (four) hours as needed. Do not exceed 3000 mg of acetaminophen in 24 hours   acetaminophen (TYLENOL) 500 MG tablet Take 500 mg by mouth 3 (three) times daily.   carbidopa-levodopa (SINEMET IR) 25-100 MG tablet Take 1 tablet by mouth 3 (three) times daily.   Carbidopa-Levodopa ER (SINEMET CR) 25-100 MG tablet controlled release Take 1 tablet by mouth at bedtime. See other listing   docusate sodium (COLACE) 100 MG capsule Take 100 mg by mouth at bedtime.   famotidine (PEPCID) 20 MG tablet Take 20 mg by mouth daily.   furosemide (LASIX) 20 MG tablet Take 1 tablet (20 mg total) by mouth daily.   hydroxychloroquine (PLAQUENIL) 200 MG tablet Take 200 mg by mouth daily.   lactose free nutrition (BOOST) LIQD Take 237 mLs by mouth daily.   levothyroxine (SYNTHROID, LEVOTHROID) 50 MCG tablet Take 50 mcg by mouth daily before breakfast.    lubiprostone (AMITIZA) 24 MCG capsule Take 24 mcg by mouth 2 (two) times daily with a meal.    midodrine (PROAMATINE) 5 MG tablet Take 1 tablet (5 mg total) by mouth 3 (three) times daily with meals.   Multiple Vitamins-Minerals (PRESERVISION AREDS 2 PO) Take 1 tablet by mouth daily.   nystatin powder Apply 1 Application topically daily. ABDOMINAL SKIN FOLD, UNDER BREAST AND GROIN   pantoprazole (PROTONIX) 40 MG tablet Take 40 mg by mouth daily.   Polyethyl  Glycol-Propyl Glycol (SYSTANE) 0.4-0.3 % SOLN Apply 1 drop to eye 2 (two) times daily as needed (dry eyes).    polyethylene glycol (MIRALAX / GLYCOLAX) 17 g packet Take 17 g by mouth daily.   potassium citrate (UROCIT-K) 10 MEQ (1080 MG) SR tablet Take 10 mEq by mouth 2 (two) times daily.   pramipexole (MIRAPEX) 0.125 MG tablet Take 0.125 mg by mouth at bedtime.   QUEtiapine (SEROQUEL) 25 MG tablet Take 25 mg by mouth daily. See other listing   QUEtiapine (SEROQUEL) 50 MG tablet Take 50 mg by mouth daily. See other listing   senna-docusate (SENOKOT-S) 8.6-50 MG tablet Take 1 tablet by mouth at bedtime.    sertraline (ZOLOFT) 100 MG tablet Take 100 mg by mouth at bedtime. With 25 mg tablet to equal 125 mg   sertraline (ZOLOFT) 25 MG tablet Take 25 mg by mouth at bedtime. Along with 100 mg tablet to equal 125 mg   No facility-administered encounter medications on file as of 08/26/2023.    Review of Systems  Unable to perform ROS: Dementia  Constitutional:  Negative for chills.  Respiratory:  Negative for cough and shortness of breath.   Cardiovascular:  Negative for leg swelling.  Genitourinary:  Negative for dysuria and hematuria.  Neurological:  Negative for dizziness.    There were no vitals filed for this visit. There is no height or weight on file to calculate BMI. Physical Exam Constitutional:      Appearance:  Normal appearance.  HENT:     Head: Normocephalic and atraumatic.  Cardiovascular:     Rate and Rhythm: Normal rate and regular rhythm.     Heart sounds: No murmur heard. Pulmonary:     Effort: Pulmonary effort is normal. No respiratory distress.     Breath sounds: Normal breath sounds. No wheezing.  Abdominal:     General: Bowel sounds are normal. There is no distension.     Tenderness: There is no abdominal tenderness. There is no guarding or rebound.  Neurological:     Mental Status: She is alert. Mental status is at baseline.     Labs reviewed: Basic Metabolic  Panel: Recent Labs    09/25/22 0000 01/01/23 0630  NA 143 140  K 4.4 4.3  CL 107 107  CO2 28* 25*  BUN 17 24*  CREATININE 1.0 1.0  CALCIUM 10.1 9.3   Liver Function Tests: Recent Labs    09/25/22 0000 01/01/23 0630  AST 24 23  ALT 14 13  ALKPHOS 76 67  ALBUMIN 4.2 3.7   No results for input(s): "LIPASE", "AMYLASE" in the last 8760 hours. No results for input(s): "AMMONIA" in the last 8760 hours. CBC: Recent Labs    09/25/22 0000 12/13/22 0630 01/01/23 0630  WBC 6.7 6.5 7.9  NEUTROABS  --  3,569.00 5,246.00  HGB 11.4* 10.6* 9.0*  HCT 35* 32* 27*  PLT 288 259 225   Cardiac Enzymes: No results for input(s): "CKTOTAL", "CKMB", "CKMBINDEX", "TROPONINI" in the last 8760 hours. BNP: Invalid input(s): "POCBNP" No results found for: "HGBA1C" Lab Results  Component Value Date   TSH 2.21 09/25/2022   No results found for: "VITAMINB12" No results found for: "FOLATE" No results found for: "IRON", "TIBC", "FERRITIN"  Imaging and Procedures obtained prior to SNF admission: DG C-Arm 1-60 Min  Result Date: 09/27/2021 CLINICAL DATA:  Cystoscopy with right ureteroscopy and laser lithotripsy. Right ureteral stent placement. Right ureteral dilatation. Bilateral retrograde pyelogram. EXAM: DG C-ARM 1-60 MIN CONTRAST:  Not provided FLUOROSCOPY TIME:  Fluoroscopy Time:  1 minute 9 seconds Radiation Exposure Index (if provided by the fluoroscopic device): 14.4 mGy Number of Acquired Spot Images: 6 COMPARISON:  CT urogram 09/08/2021 FINDINGS: Submitted intraoperative fluoroscopic images demonstrate balloon dilatation the distal right ureter. Right ureteral stent is seen. There is cannulation of the distal left ureter with retrograde opacification of the renal collecting system and ureter. IMPRESSION: Intraoperative images of left retrograde pyelogram and right ureteral dilation and stent placement as above. Electronically Signed   By: Acquanetta Belling M.D.   On: 09/27/2021 14:26     Assessment/Plan  1. Memory loss Cont with supportive care Cont with hospice  2. Dysuria H/o Renal lithiasis recurrent UTI, urosepsis, treated by ID. 09/19/21 cystoscopy, laser lithotripsy, R ureteral stent/dilatation Pt denies dysuria    3. Gastroesophageal reflux disease, unspecified whether esophagitis present Cont with pantop  4. Restless leg syndrome Cont with mirapex, sinemet  5. Depression, psychotic (HCC) On zoloft  6. Acquired hypothyroidism Cont with synthyroid  7. Erosive (osteo)arthritis On plaquenil  8. Parkinson's disease without fluctuating manifestations, unspecified whether dyskinesia present Hallucinations No concerns as per nursing staff Cont with zoloft , seroquel    9. Severe aortic stenosis - not a candidate for TAVR   Echocardiogram 11/15/20 EF 60-65%.   10. Chronic heart failure with preserved ejection fraction (HCC)   11. Hypotension, unspecified hypotension type On midodrine  Family/ staff Communication: care plan discussed with the nurse

## 2023-08-29 ENCOUNTER — Encounter: Payer: Self-pay | Admitting: Sports Medicine

## 2023-09-18 ENCOUNTER — Encounter: Payer: Self-pay | Admitting: Adult Health

## 2023-09-18 ENCOUNTER — Non-Acute Institutional Stay (SKILLED_NURSING_FACILITY): Payer: Medicare Other | Admitting: Adult Health

## 2023-09-18 DIAGNOSIS — E039 Hypothyroidism, unspecified: Secondary | ICD-10-CM

## 2023-09-18 DIAGNOSIS — M3500 Sicca syndrome, unspecified: Secondary | ICD-10-CM

## 2023-09-18 DIAGNOSIS — F323 Major depressive disorder, single episode, severe with psychotic features: Secondary | ICD-10-CM | POA: Diagnosis not present

## 2023-09-18 DIAGNOSIS — I959 Hypotension, unspecified: Secondary | ICD-10-CM

## 2023-09-18 DIAGNOSIS — G2581 Restless legs syndrome: Secondary | ICD-10-CM | POA: Diagnosis not present

## 2023-09-18 DIAGNOSIS — K219 Gastro-esophageal reflux disease without esophagitis: Secondary | ICD-10-CM

## 2023-09-18 DIAGNOSIS — G20A1 Parkinson's disease without dyskinesia, without mention of fluctuations: Secondary | ICD-10-CM

## 2023-09-18 NOTE — Progress Notes (Signed)
Location:  [facility] Room Number: 45-A Place of Service:  SNF (31) Provider:  Kenard Gower, DNP, FNP-BC  Patient Care Team: [name], Man X, NP as PCP - General (Internal Medicine) Quintella Reichert, MD as PCP - Cardiology (Cardiology) Arman Bogus, MD (Neurosurgery) Suanne Marker, MD (Neurology) Carman Ching, MD (Inactive) as Consulting Physician (Gastroenterology) Berneice Heinrich Delbert Phenix., MD as Consulting Physician (Urology) [name], Man X, NP as Nurse Practitioner (Internal Medicine) Tat, Octaviano Batty, DO as Consulting Physician (Neurology)  Extended Emergency Contact Information Primary Emergency Contact: [facility] Address: [address]          [place], [place] [address] D[place] of [place] Home Phone: [phone] Mobile Phone: [phone] Relation: Spouse Secondary Emergency Contact: Abbey Chatters Mobile Phone: [phone] Relation: Daughter Preferred language: [place] Interpreter needed? No  Code Status:  DNR  Goals of care: Advanced Directive information    09/18/2023   10:41 AM  Advanced Directives  Does Patient Have a Medical Advance Directive? Yes  Type of Advance Directive Out of facility DNR (pink MOST or yellow form)  Does patient want to make changes to medical advance directive? No - Patient declined  Pre-existing out of facility DNR order (yellow form or pink MOST form) Pink MOST/Yellow Form most recent copy in chart - Physician notified to receive inpatient order     Chief Complaint  Patient presents with   Routine    HPI:  Pt is a 89 y.o. female seen today for medical management of chronic diseases. She is a resident of [facility].  Depression, psychotic (HCC) -  PHQ-9 score 11, moderate depression, takes Seroquel and sertraline  Restless leg syndrome takes-   takes pramipexole  Sjogren's syndrome, with unspecified organ involvement (HCC)  -  takes  Plaquenil  Acquired  hypothyroidism  - tsh 2.21, takes levothyroxine  Parkinson's disease without fluctuating manifestations, unspecified whether dyskinesia present (HCC) -  takes carbidopa- levodopa  Hypotension, unspecified hypotension type -  BP 120/62, takes midodrine  Gastroesophageal reflux disease, unspecified whether esophagitis present - no reported heart burns, takes pantoprazole and famotidine    Past Medical History:  Diagnosis Date   Abnormal nuclear stress test    Actinic cheilitis 04/23/2019   Anemia    iron deficiency    Anxiety    Aortic stenosis    severe by echo 2021    Arthritis    Broken arm    right    Chronic diastolic CHF (congestive heart failure) (HCC)    Chronic pain    Cognitive communication deficit    Cognitive deficits    Communication   Constipation    Depression    Erosive (osteo)arthritis 03/09/2020   Family history of adverse reaction to anesthesia    pt. states sister vomits   Fibromyalgia    GERD (gastroesophageal reflux disease)    H/O hiatal hernia    Hallucinations, visual 07/21/2018   03/10/20 psych consult.    Hematuria 05/18/2020   History of bronchitis    History of kidney stones    Hypercholesterolemia    Hypertension    dr t turner   Hypothyroidism    Memory loss 04/21/2018   08/08/18 CT head no traumatic findings, atrophy.  09/27/19 MMSE 25/30, failed the clock drawing   Microhematuria 03/24/2015   Mild CAD    Neuropathy    Osteoporosis    Parkinson disease (HCC)    PVD (peripheral vascular disease) (HCC)  99% stenosis of left anteiror tibial artery, mod stenosis of left distal SFA and popliteal artery followed by Dr. Darrick Penna   RBBB    noted on EKG 2018   Restless leg syndrome    Sepsis (HCC)    hx of due to [facility]   Septic shock (HCC) 04/19/2020   Shortness of breath    with exertion - chronic    Sjogren's disease (HCC)    Sjogren's syndrome (HCC) 02/04/2018   04/16/19 rheumatology: Erosive OA of hands, Sjogrens syndrome, low back  pain at multiple sites, recurrent kidney stones, age related osteoporosis w/o current pathological fracture, f/u 6 months.    SOB (shortness of breath)    chronic due to diastolic dysfunction, deconditioning, obesity   Spinal stenosis    lumbar region    Tremor    Unstable gait 04/21/2018   Unsteadiness on feet    Urinary frequency 09/18/2018   Urinary tract infection    Visual hallucinations    Past Surgical History:  Procedure Laterality Date   ABDOMINAL HYSTERECTOMY     BACK SURGERY     4 back surgeries,   lumbar fusion   CARDIAC CATHETERIZATION  2006   normal   CYSTOSCOPY/URETEROSCOPY/HOLMIUM LASER/STENT PLACEMENT Left 01/06/2019   Procedure: CYSTOSCOPY/RETROGRADE/URETEROSCOPY/HOLMIUM LASER/BASKET RETRIEVAL/STENT PLACEMENT;  Surgeon: Rene Paci, MD;  Location: [facility];  Service: Urology;  Laterality: Left;   CYSTOSCOPY/URETEROSCOPY/HOLMIUM LASER/STENT PLACEMENT Bilateral 05/24/2020   Procedure: CYSTOSCOPY/RETROGRADE/URETEROSCOPY/HOLMIUM LASER/STENT PLACEMENT;  Surgeon: Rene Paci, MD;  Location: [facility];  Service: Urology;  Laterality: Bilateral;   CYSTOSCOPY/URETEROSCOPY/HOLMIUM LASER/STENT PLACEMENT Right 09/27/2021   Procedure: CYSTOSCOPY/BILATERAL RETROGRADE/URETEROSCOPY/HOLMIUM LASER/STENT PLACEMENT;  Surgeon: Rene Paci, MD;  Location: [facility];  Service: Urology;  Laterality: Right;  ONLY NEEDS 60 MIN   EYE SURGERY Bilateral    cateracts   falls     variious fall, broken wrist,and toes   FRACTURE SURGERY Right April 2016   Wrist, Pt. fell   IR NEPHROSTOMY PLACEMENT RIGHT  04/19/2020   kidney stone removal Left 01/20/2019   RIGHT/LEFT HEART CATH AND CORONARY ANGIOGRAPHY N/A 02/05/2018   Procedure: RIGHT/LEFT HEART CATH AND CORONARY ANGIOGRAPHY;  Surgeon: Lennette Bihari, MD;  Location: [facility];  Service: Cardiovascular;  Laterality: N/A;   SPINAL CORD STIMULATOR INSERTION N/A 09/09/2015   Procedure: LUMBAR SPINAL CORD  STIMULATOR INSERTION;  Surgeon: Odette Fraction, MD;  Location: [facility];  Service: Neurosurgery;  Laterality: N/A;  LUMBAR SPINAL CORD STIMULATOR INSERTION   SPINE SURGERY  April 2013   Back X's 4   TOTAL HIP ARTHROPLASTY     right   WRIST FRACTURE SURGERY Bilateral     Allergies  Allergen Reactions   Phosphate     Other reaction(s): Unknown   Adrenalone    Codeine Other (See Comments)    Reaction:  Headaches and nightmares  Other reaction(s): Unknown   Lactose Other (See Comments)    abd pain, lactose intolerant   Lactose Intolerance (Gi) Nausea And Vomiting   Latex Rash   Lyrica [Pregabalin] Swelling and Other (See Comments)    Reaction:  Leg swelling   Morphine Other (See Comments)   Other Other (See Comments)    Pt states that pain medications give her nightmares.     Plaquenil [Hydroxychloroquine Sulfate] Other (See Comments)    Reaction:  GI upset    Reglan [Metoclopramide] Other (See Comments)    Reaction:  GI upset    Requip [Ropinirole Hcl] Other (See Comments)  Reaction:  GI upset    Septra [Sulfamethoxazole-Trimethoprim] Nausea And Vomiting   Shellfish Allergy Nausea And Vomiting   Sulfa Antibiotics Other (See Comments)    Headache, very sick    Outpatient Encounter Medications as of 09/18/2023  Medication Sig   acetaminophen (TYLENOL) 325 MG tablet Take 650 mg by mouth every 4 (four) hours as needed. Do not exceed 3000 mg of acetaminophen in 24 hours   acetaminophen (TYLENOL) 500 MG tablet Take 500 mg by mouth 3 (three) times daily.   carbidopa-levodopa (SINEMET IR) 25-100 MG tablet Take 1 tablet by mouth 3 (three) times daily.   docusate sodium (COLACE) 100 MG capsule Take 100 mg by mouth at bedtime.   famotidine (PEPCID) 20 MG tablet Take 20 mg by mouth daily.   furosemide (LASIX) 20 MG tablet Take 1 tablet (20 mg total) by mouth daily.   hydroxychloroquine (PLAQUENIL) 200 MG tablet Take 200 mg by mouth daily.   lactose free nutrition (BOOST) LIQD Take  237 mLs by mouth daily.   levothyroxine (SYNTHROID, LEVOTHROID) 50 MCG tablet Take 50 mcg by mouth daily before breakfast.    lubiprostone (AMITIZA) 24 MCG capsule Take 24 mcg by mouth 2 (two) times daily with a meal.    midodrine (PROAMATINE) 5 MG tablet Take 1 tablet (5 mg total) by mouth 3 (three) times daily with meals.   Multiple Vitamins-Minerals (PRESERVISION AREDS 2 PO) Take 1 tablet by mouth daily.   nystatin powder Apply 1 Application topically daily. ABDOMINAL SKIN FOLD, UNDER BREAST AND GROIN   pantoprazole (PROTONIX) 40 MG tablet Take 40 mg by mouth daily.   Polyethyl Glycol-Propyl Glycol (SYSTANE) 0.4-0.3 % SOLN Apply 1 drop to eye 2 (two) times daily as needed (dry eyes).    polyethylene glycol (MIRALAX / GLYCOLAX) 17 g packet Take 17 g by mouth daily.   potassium citrate (UROCIT-K) 10 MEQ (1080 MG) SR tablet Take 10 mEq by mouth 2 (two) times daily.   pramipexole (MIRAPEX) 0.125 MG tablet Take 0.125 mg by mouth at bedtime.   QUEtiapine (SEROQUEL) 25 MG tablet Take 25 mg by mouth daily. See other listing   QUEtiapine (SEROQUEL) 50 MG tablet Take 50 mg by mouth daily. See other listing   senna-docusate (SENOKOT-S) 8.6-50 MG tablet Take 1 tablet by mouth at bedtime.    sertraline (ZOLOFT) 100 MG tablet Take 100 mg by mouth at bedtime. With 25 mg tablet to equal 125 mg   sertraline (ZOLOFT) 25 MG tablet Take 25 mg by mouth at bedtime. Along with 100 mg tablet to equal 125 mg   [DISCONTINUED] Carbidopa-Levodopa ER (SINEMET CR) 25-100 MG tablet controlled release Take 1 tablet by mouth at bedtime. See other listing   No facility-administered encounter medications on file as of 09/18/2023.    Review of Systems   Unable to obtain due to dementia    Immunization History  Administered Date(s) Administered   Hepatitis A, Adult 03/01/1997, 09/20/1997   Influenza Split 09/09/2013, 09/27/2014   Influenza, High Dose Seasonal PF 08/01/2011, 08/27/2012, 10/14/2013, 09/08/2015, 09/10/2016,  09/12/2017, 09/12/2018, 09/15/2019, 10/03/2022   Influenza,inj,Quad PF,6+ Mos 09/11/2016   Influenza-Unspecified 09/21/2020, 09/28/2021, 10/03/2022   Moderna SARS-COV2 Booster Vaccination 04/27/2022, 10/11/2022   Moderna Sars-Covid-2 Vaccination 12/12/2019, 01/09/2020, 10/18/2020, 05/09/2021, 08/29/2021   Pneumococcal Conjugate-13 04/15/2014   Pneumococcal Polysaccharide-23 09/28/2005   Td 06/20/2016   Tdap 04/29/2006, 09/01/2015   Zoster Recombinant(Shingrix) 06/11/2018, 09/10/2018   Zoster, Live 04/29/2006   Pertinent  Health Maintenance Due  Topic Date Due  INFLUENZA VACCINE  07/11/2023   DEXA SCAN  Completed      09/25/2022    2:40 PM 09/28/2022   11:43 AM 11/27/2022   10:40 AM 01/22/2023   11:45 AM 03/05/2023    8:29 AM  Fall Risk  Falls in the past year? 0 0 0 0 0  Was there an injury with Fall?  0 0 0 0  Fall Risk Category Calculator  0 0 0 0  Fall Risk Category (Retired)  Low Low    (RETIRED) Patient Fall Risk Level   Low fall risk    Patient at Risk for Falls Due to  History of fall(s) History of fall(s) History of fall(s) History of fall(s)  Fall risk Follow up  Falls evaluation completed Falls evaluation completed Falls evaluation completed Falls evaluation completed     Vitals:   09/18/23 1011  BP: 120/62  Pulse: 75  Resp: 16  Temp: 97.6 F (36.4 C)  SpO2: 96%  Weight: 112 lb 11.2 oz (51.1 kg)  Height: 4\' 8"  (1.422 m)   Body mass index is 25.27 kg/m.  Physical Exam Constitutional:      General: She is not in acute distress.    Appearance: Normal appearance.  HENT:     Head: Normocephalic and atraumatic.     Nose: Nose normal.     Mouth/Throat:     Mouth: Mucous membranes are moist.  Eyes:     Conjunctiva/sclera: Conjunctivae normal.  Cardiovascular:     Rate and Rhythm: Normal rate and regular rhythm.     Heart sounds: Murmur heard.  Pulmonary:     Effort: Pulmonary effort is normal.     Breath sounds: Normal breath sounds.  Abdominal:      General: Bowel sounds are normal.     Palpations: Abdomen is soft.  Musculoskeletal:        General: Swelling present.     Cervical back: Normal range of motion.     Right lower leg: Edema present.     Left lower leg: Edema present.     Comments: BLE trace edema  Skin:    General: Skin is warm and dry.  Neurological:     Mental Status: She is alert. Mental status is at baseline.  Psychiatric:        Mood and Affect: Mood normal.        Behavior: Behavior normal.      Labs reviewed: Recent Labs    09/25/22 0000 01/01/23 0630  NA 143 140  K 4.4 4.3  CL 107 107  CO2 28* 25*  BUN 17 24*  CREATININE 1.0 1.0  CALCIUM 10.1 9.3   Recent Labs    09/25/22 0000 01/01/23 0630  AST 24 23  ALT 14 13  ALKPHOS 76 67  ALBUMIN 4.2 3.7   Recent Labs    09/25/22 0000 12/13/22 0630 01/01/23 0630  WBC 6.7 6.5 7.9  NEUTROABS  --  3,569.00 5,246.00  HGB 11.4* 10.6* 9.0*  HCT 35* 32* 27*  PLT 288 259 225   Lab Results  Component Value Date   TSH 2.21 09/25/2022   No results found for: "HGBA1C" Lab Results  Component Value Date   CHOL 137 01/11/2022   HDL 47 01/11/2022   LDLCALC 64 01/11/2022   TRIG 184 (A) 01/11/2022   CHOLHDL 2.4 07/14/2019    Significant Diagnostic Results in last 30 days:  No results found.  Assessment/Plan  1. Depression, psychotic (HCC) -  has moderate  depression -Continue sertraline and Seroquel  2. Restless leg syndrome -  stable -Continue pramipexole  3. Sjogren's syndrome, with unspecified organ involvement (HCC) -Continue Plaquenil  4. Acquired hypothyroidism Lab Results  Component Value Date   TSH 2.21 09/25/2022    -Continue levothyroxine  5. Parkinson's disease without fluctuating manifestations, unspecified whether dyskinesia present (HCC) -  stable -Continue carbidopa-levodopa -Fall precautions  6. Hypotension, unspecified hypotension type -BP stable -Continue midodrine  7. Gastroesophageal reflux disease,  unspecified whether esophagitis present -  stable -Continue pantoprazole and famotidine     Family/ staff Communication: Discussed plan of care with charge nurse.  Labs/tests ordered: None    Kenard Gower, DNP, MSN, FNP-BC [facility] [phone] (Monday-Friday 8:00 a.m. - 5:00 p.m.) [phone] (after hours)

## 2023-10-22 ENCOUNTER — Non-Acute Institutional Stay (SKILLED_NURSING_FACILITY): Payer: Self-pay | Admitting: Nurse Practitioner

## 2023-10-22 ENCOUNTER — Encounter: Payer: Self-pay | Admitting: Nurse Practitioner

## 2023-10-22 DIAGNOSIS — D509 Iron deficiency anemia, unspecified: Secondary | ICD-10-CM

## 2023-10-22 DIAGNOSIS — G2581 Restless legs syndrome: Secondary | ICD-10-CM

## 2023-10-22 DIAGNOSIS — Z66 Do not resuscitate: Secondary | ICD-10-CM | POA: Diagnosis not present

## 2023-10-22 DIAGNOSIS — I951 Orthostatic hypotension: Secondary | ICD-10-CM | POA: Diagnosis not present

## 2023-10-22 DIAGNOSIS — F323 Major depressive disorder, single episode, severe with psychotic features: Secondary | ICD-10-CM

## 2023-10-22 DIAGNOSIS — N200 Calculus of kidney: Secondary | ICD-10-CM

## 2023-10-22 DIAGNOSIS — E039 Hypothyroidism, unspecified: Secondary | ICD-10-CM

## 2023-10-22 DIAGNOSIS — M154 Erosive (osteo)arthritis: Secondary | ICD-10-CM

## 2023-10-22 DIAGNOSIS — I350 Nonrheumatic aortic (valve) stenosis: Secondary | ICD-10-CM

## 2023-10-22 DIAGNOSIS — I5032 Chronic diastolic (congestive) heart failure: Secondary | ICD-10-CM

## 2023-10-22 DIAGNOSIS — K219 Gastro-esophageal reflux disease without esophagitis: Secondary | ICD-10-CM

## 2023-10-22 DIAGNOSIS — G20A1 Parkinson's disease without dyskinesia, without mention of fluctuations: Secondary | ICD-10-CM

## 2023-10-22 DIAGNOSIS — K5909 Other constipation: Secondary | ICD-10-CM

## 2023-10-22 NOTE — Assessment & Plan Note (Signed)
/  trace edema BLE, takes Furosemide, cardiology, severe aortic valve stenosis. Echocardiogram 11/15/20 EF 60-65%. Bun/creat 24/1.0 01/01/23, weight stable.

## 2023-10-22 NOTE — Assessment & Plan Note (Signed)
Stable,   takes Sertraline,  Quetiapine. Change Tramadol to prn helped. TSH 2.21 09/25/22

## 2023-10-22 NOTE — Assessment & Plan Note (Signed)
chronic lower back pain, travels to buttock and thighs,  aches and pains,  takes Plaquenil, Tylenol  f/u Rheumatology.

## 2023-10-22 NOTE — Assessment & Plan Note (Signed)
Blood pressure is maintained  on Midodrine

## 2023-10-22 NOTE — Assessment & Plan Note (Signed)
takes Levothyroxine TSH 2.21 09/25/22, update TSH, CBC/diff, CMP/eGFR

## 2023-10-22 NOTE — Assessment & Plan Note (Signed)
stable on MiraPex, better sleep at night.

## 2023-10-22 NOTE — Assessment & Plan Note (Signed)
takes Pantoprazole, Famotidine, yearly FOBT per GI, Hgb 9.0 01/01/23 

## 2023-10-22 NOTE — Assessment & Plan Note (Signed)
Cardiology palliative approach. On Midodrine per Cardiology. More noticeable DOE, fatigue. under hospice service.

## 2023-10-22 NOTE — Assessment & Plan Note (Addendum)
takes Sinemet, MiraPex f/u Dr. Arbutus Leas, w/c for mobility most of time.  Progressing memory issues, SNF [facility] for supportive care.  MMSE 15/30 11/28/22, under Hospice care.

## 2023-10-22 NOTE — Assessment & Plan Note (Signed)
off Fe, ASA, Hgb 9.0 01/01/23, update CBC/diff

## 2023-10-22 NOTE — Progress Notes (Unsigned)
Location:  [facility] Room Number: 045-A Place of Service:  SNF (31) Provider: Shenee Wignall X,NP  Lyall Faciane X, NP  Patient Care Team: Fabiano Ginley X, NP as PCP - General (Internal Medicine) Quintella Reichert, MD as PCP - Cardiology (Cardiology) Arman Bogus, MD (Neurosurgery) Suanne Marker, MD (Neurology) Carman Ching, MD (Inactive) as Consulting Physician (Gastroenterology) Berneice Heinrich Delbert Phenix., MD as Consulting Physician (Urology) Shaianne Nucci X, NP as Nurse Practitioner (Internal Medicine) Tat, Octaviano Batty, DO as Consulting Physician (Neurology)  Extended Emergency Contact Information Primary Emergency Contact: [facility] Address: [address]          [place], [place] [address] [place] Home Phone: [phone] Mobile Phone: [phone] Relation: Spouse Secondary Emergency Contact: Abbey Chatters Mobile Phone: [phone] Relation: Daughter Preferred language: [place] Interpreter needed? No  Code Status:  DNR Goals of care: Advanced Directive information    10/22/2023   10:17 AM  Advanced Directives  Does Patient Have a Medical Advance Directive? Yes  Type of Advance Directive Out of facility DNR (pink MOST or yellow form)  Does patient want to make changes to medical advance directive? No - Patient declined  Pre-existing out of facility DNR order (yellow form or pink MOST form) Pink MOST/Yellow Form most recent copy in chart - Physician notified to receive inpatient order     Chief Complaint  Patient presents with   Medical Management of Chronic Issues    Routine visit and discuss flu and covid vaccines    HPI:  Pt is a 89 y.o. female seen today for medical management of chronic diseases.   Memory loss, progressing, SNF [facility] for supportive care.  MMSE 15/30 11/28/22, under Hospice care.              Dry eyes, uses artificial tears.  Prolapsed rectum, avoid constipation, usually reduced on its own.               Hx of rena lithiasis, recurrent UTI, urosepsis, treated by ID. 09/19/21 cystoscopy, laser lithotripsy, R ureteral stent/dilatation. Occasional dysuria present. Pyridium helps.              GERD, takes Pantoprazole, Famotidine, yearly FOBT per GI, Hgb 9.0 01/01/23             Parkinson's, takes Sinemet, MiraPex f/u Dr. Arbutus Leas, w/c for mobility most of time.              Hypotension, on Midodrine.              RLS stable on MiraPex, better sleep at night.              Depression/hallucination,  takes Sertraline,  Quetiapine. Change Tramadol to prn helped. TSH 2.21 09/25/22             Constipation, takes Senokot S, Amitiza, MiraLax              Hypothyroidism, takes Levothyroxine TSH 2.21 09/25/22             Erosive arthritis, chronic lower back pain, travels to buttock and thighs,  aches and pains,  takes Plaquenil, Tylenol  f/u Rheumatology.              CHF/trace edema BLE, takes Furosemide, cardiology, severe aortic valve stenosis. Echocardiogram 11/15/20 EF 60-65%. Bun/creat 24/1.0 01/01/23, weight stable.              Hyperlipidemia, LDL 64 01/11/22, off Rosuvastatin.  Anemia, off Fe, ASA, Hgb 9.0 01/01/23             AS/CAD Cardiology palliative approach. On Midodrine per Cardiology. More noticeable DOE, fatigue. under hospice service.       Past Medical History:  Diagnosis Date   Abnormal nuclear stress test    Actinic cheilitis 04/23/2019   Anemia    iron deficiency    Anxiety    Aortic stenosis    severe by echo 2021    Arthritis    Broken arm    right    Chronic diastolic CHF (congestive heart failure) (HCC)    Chronic pain    Cognitive communication deficit    Cognitive deficits    Communication   Constipation    Depression    Erosive (osteo)arthritis 03/09/2020   Family history of adverse reaction to anesthesia    pt. states sister vomits   Fibromyalgia    GERD (gastroesophageal reflux disease)    H/O hiatal hernia    Hallucinations, visual 07/21/2018    03/10/20 psych consult.    Hematuria 05/18/2020   History of bronchitis    History of kidney stones    Hypercholesterolemia    Hypertension    dr t turner   Hypothyroidism    Memory loss 04/21/2018   08/08/18 CT head no traumatic findings, atrophy.  09/27/19 MMSE 25/30, failed the clock drawing   Microhematuria 03/24/2015   Mild CAD    Neuropathy    Osteoporosis    Parkinson disease (HCC)    PVD (peripheral vascular disease) (HCC)    99% stenosis of left anteiror tibial artery, mod stenosis of left distal SFA and popliteal artery followed by Dr. Darrick Penna   RBBB    noted on EKG 2018   Restless leg syndrome    Sepsis (HCC)    hx of due to Ecoli   Septic shock (HCC) 04/19/2020   Shortness of breath    with exertion - chronic    Sjogren's disease (HCC)    Sjogren's syndrome (HCC) 02/04/2018   04/16/19 rheumatology: Erosive OA of hands, Sjogrens syndrome, low back pain at multiple sites, recurrent kidney stones, age related osteoporosis w/o current pathological fracture, f/u 6 months.    SOB (shortness of breath)    chronic due to diastolic dysfunction, deconditioning, obesity   Spinal stenosis    lumbar region    Tremor    Unstable gait 04/21/2018   Unsteadiness on feet    Urinary frequency 09/18/2018   Urinary tract infection    Visual hallucinations    Past Surgical History:  Procedure Laterality Date   ABDOMINAL HYSTERECTOMY     BACK SURGERY     4 back surgeries,   lumbar fusion   CARDIAC CATHETERIZATION  2006   normal   CYSTOSCOPY/URETEROSCOPY/HOLMIUM LASER/STENT PLACEMENT Left 01/06/2019   Procedure: CYSTOSCOPY/RETROGRADE/URETEROSCOPY/HOLMIUM LASER/BASKET RETRIEVAL/STENT PLACEMENT;  Surgeon: Rene Paci, MD;  Location: [facility];  Service: Urology;  Laterality: Left;   CYSTOSCOPY/URETEROSCOPY/HOLMIUM LASER/STENT PLACEMENT Bilateral 05/24/2020   Procedure: CYSTOSCOPY/RETROGRADE/URETEROSCOPY/HOLMIUM LASER/STENT PLACEMENT;  Surgeon: Rene Paci, MD;   Location: [facility];  Service: Urology;  Laterality: Bilateral;   CYSTOSCOPY/URETEROSCOPY/HOLMIUM LASER/STENT PLACEMENT Right 09/27/2021   Procedure: CYSTOSCOPY/BILATERAL RETROGRADE/URETEROSCOPY/HOLMIUM LASER/STENT PLACEMENT;  Surgeon: Rene Paci, MD;  Location: [facility];  Service: Urology;  Laterality: Right;  ONLY NEEDS 60 MIN   EYE SURGERY Bilateral    cateracts   falls     variious fall, broken wrist,and toes   FRACTURE SURGERY Right April 2016  Wrist, Pt. fell   IR NEPHROSTOMY PLACEMENT RIGHT  04/19/2020   kidney stone removal Left 01/20/2019   RIGHT/LEFT HEART CATH AND CORONARY ANGIOGRAPHY N/A 02/05/2018   Procedure: RIGHT/LEFT HEART CATH AND CORONARY ANGIOGRAPHY;  Surgeon: Lennette Bihari, MD;  Location: [facility];  Service: Cardiovascular;  Laterality: N/A;   SPINAL CORD STIMULATOR INSERTION N/A 09/09/2015   Procedure: LUMBAR SPINAL CORD STIMULATOR INSERTION;  Surgeon: Odette Fraction, MD;  Location: [facility];  Service: Neurosurgery;  Laterality: N/A;  LUMBAR SPINAL CORD STIMULATOR INSERTION   SPINE SURGERY  April 2013   Back X's 4   TOTAL HIP ARTHROPLASTY     right   WRIST FRACTURE SURGERY Bilateral     Allergies  Allergen Reactions   Phosphate     Other reaction(s): Unknown   Adrenalone    Codeine Other (See Comments)    Reaction:  Headaches and nightmares  Other reaction(s): Unknown   Lactose Other (See Comments)    abd pain, lactose intolerant   Lactose Intolerance (Gi) Nausea And Vomiting   Latex Rash   Lyrica [Pregabalin] Swelling and Other (See Comments)    Reaction:  Leg swelling   Morphine Other (See Comments)   Other Other (See Comments)    Pt states that pain medications give her nightmares.     Plaquenil [Hydroxychloroquine Sulfate] Other (See Comments)    Reaction:  GI upset    Reglan [Metoclopramide] Other (See Comments)    Reaction:  GI upset    Requip [Ropinirole Hcl] Other (See Comments)    Reaction:  GI upset    Septra  [Sulfamethoxazole-Trimethoprim] Nausea And Vomiting   Shellfish Allergy Nausea And Vomiting   Sulfa Antibiotics Other (See Comments)    Headache, very sick    Outpatient Encounter Medications as of 10/22/2023  Medication Sig   acetaminophen (TYLENOL) 325 MG tablet Take 650 mg by mouth every 4 (four) hours as needed. Do not exceed 3000 mg of acetaminophen in 24 hours   acetaminophen (TYLENOL) 500 MG tablet Take 500 mg by mouth 3 (three) times daily.   carbidopa-levodopa (SINEMET IR) 25-100 MG tablet Take 1 tablet by mouth 3 (three) times daily.   docusate sodium (COLACE) 100 MG capsule Take 100 mg by mouth at bedtime.   famotidine (PEPCID) 20 MG tablet Take 20 mg by mouth daily.   furosemide (LASIX) 20 MG tablet Take 1 tablet (20 mg total) by mouth daily.   hydroxychloroquine (PLAQUENIL) 200 MG tablet Take 200 mg by mouth daily.   lactose free nutrition (BOOST) LIQD Take 237 mLs by mouth daily.   levothyroxine (SYNTHROID, LEVOTHROID) 50 MCG tablet Take 50 mcg by mouth daily before breakfast.    lubiprostone (AMITIZA) 24 MCG capsule Take 24 mcg by mouth 2 (two) times daily with a meal.    midodrine (PROAMATINE) 5 MG tablet Take 1 tablet (5 mg total) by mouth 3 (three) times daily with meals.   Multiple Vitamins-Minerals (PRESERVISION AREDS 2 PO) Take 1 tablet by mouth daily.   nystatin powder Apply 1 Application topically daily. ABDOMINAL SKIN FOLD, UNDER BREAST AND GROIN   pantoprazole (PROTONIX) 40 MG tablet Take 40 mg by mouth daily.   Polyethyl Glycol-Propyl Glycol (SYSTANE) 0.4-0.3 % SOLN Apply 1 drop to eye 2 (two) times daily as needed (dry eyes).    polyethylene glycol (MIRALAX / GLYCOLAX) 17 g packet Take 17 g by mouth daily.   potassium citrate (UROCIT-K) 10 MEQ (1080 MG) SR tablet Take 10 mEq by  mouth 2 (two) times daily.   pramipexole (MIRAPEX) 0.125 MG tablet Take 0.125 mg by mouth at bedtime.   QUEtiapine (SEROQUEL) 25 MG tablet Take 25 mg by mouth daily. See other listing    QUEtiapine (SEROQUEL) 50 MG tablet Take 50 mg by mouth daily. See other listing   senna-docusate (SENOKOT-S) 8.6-50 MG tablet Take 1 tablet by mouth at bedtime.    sertraline (ZOLOFT) 100 MG tablet Take 100 mg by mouth at bedtime. With 25 mg tablet to equal 125 mg   sertraline (ZOLOFT) 25 MG tablet Take 25 mg by mouth at bedtime. Along with 100 mg tablet to equal 125 mg   No facility-administered encounter medications on file as of 10/22/2023.    Review of Systems  Unable to perform ROS: Dementia    Immunization History  Administered Date(s) Administered   Hepatitis A, Adult 03/01/1997, 09/20/1997   Influenza Split 09/09/2013, 09/27/2014   Influenza, High Dose Seasonal PF 08/01/2011, 08/27/2012, 10/14/2013, 09/08/2015, 09/10/2016, 09/12/2017, 09/12/2018, 09/15/2019, 10/03/2022, 09/11/2023   Influenza,inj,Quad PF,6+ Mos 09/11/2016   Influenza-Unspecified 09/21/2020, 09/28/2021, 10/03/2022   Moderna Covid-19 Vaccine Bivalent Booster 14yrs & up 09/25/2023   Moderna SARS-COV2 Booster Vaccination 04/27/2022, 10/11/2022   Moderna Sars-Covid-2 Vaccination 12/12/2019, 01/09/2020, 10/18/2020, 05/09/2021, 08/29/2021   Pneumococcal Conjugate-13 04/15/2014   Pneumococcal Polysaccharide-23 09/28/2005   Td 06/20/2016   Tdap 04/29/2006, 09/01/2015   Zoster Recombinant(Shingrix) 06/11/2018, 09/10/2018   Zoster, Live 04/29/2006   Pertinent  Health Maintenance Due  Topic Date Due   INFLUENZA VACCINE  Completed   DEXA SCAN  Completed      09/25/2022    2:40 PM 09/28/2022   11:43 AM 11/27/2022   10:40 AM 01/22/2023   11:45 AM 03/05/2023    8:29 AM  Fall Risk  Falls in the past year? 0 0 0 0 0  Was there an injury with Fall?  0 0 0 0  Fall Risk Category Calculator  0 0 0 0  Fall Risk Category (Retired)  Low Low    (RETIRED) Patient Fall Risk Level   Low fall risk    Patient at Risk for Falls Due to  History of fall(s) History of fall(s) History of fall(s) History of fall(s)  Fall risk Follow  up  Falls evaluation completed Falls evaluation completed Falls evaluation completed Falls evaluation completed   Functional Status Survey:    Vitals:   10/22/23 1006  BP: 110/70  Pulse: 80  Resp: 18  Temp: 98 F (36.7 C)  SpO2: 98%  Weight: 117 lb 12.8 oz (53.4 kg)  Height: 4\' 8"  (1.422 m)   Body mass index is 26.41 kg/m. Physical Exam Vitals and nursing note reviewed.  Constitutional:      Appearance: Normal appearance.  HENT:     Head: Normocephalic and atraumatic.     Nose: Nose normal.     Mouth/Throat:     Mouth: Mucous membranes are moist.  Eyes:     Extraocular Movements: Extraocular movements intact.     Conjunctiva/sclera: Conjunctivae normal.     Pupils: Pupils are equal, round, and reactive to light.  Cardiovascular:     Rate and Rhythm: Normal rate and regular rhythm.     Heart sounds: Murmur heard.  Pulmonary:     Effort: Pulmonary effort is normal.     Breath sounds: No rales.  Abdominal:     General: Bowel sounds are normal.     Palpations: Abdomen is soft.     Tenderness: There is no abdominal  tenderness.  Genitourinary:    Comments: Hx of reported protruded rectum was reduced on its own, a external hemorrhoid at 7pm, no s/s of injury.  C/o burning sensation upon urination, poor historian.  Musculoskeletal:        General: Tenderness present.     Cervical back: Normal range of motion and neck supple.     Right lower leg: Edema present.     Left lower leg: Edema present.     Comments: Trace edema BLE.  Arthritic changes in fingers, R>L. The R 3rd finger mid knuckle swelling, no heat or redness. Chronic lower back pain travels to buttocks/thighs  Skin:    General: Skin is warm and dry.  Neurological:     General: No focal deficit present.     Mental Status: She is alert. Mental status is at baseline.     Gait: Gait abnormal.     Comments: Oriented to person, place. Ambulates with walker.   Psychiatric:     Comments: Visual hallucination vs  delusion is chronic, progressing.      Labs reviewed: Recent Labs    01/01/23 0630  NA 140  K 4.3  CL 107  CO2 25*  BUN 24*  CREATININE 1.0  CALCIUM 9.3   Recent Labs    01/01/23 0630  AST 23  ALT 13  ALKPHOS 67  ALBUMIN 3.7   Recent Labs    12/13/22 0630 01/01/23 0630  WBC 6.5 7.9  NEUTROABS 3,569.00 5,246.00  HGB 10.6* 9.0*  HCT 32* 27*  PLT 259 225   Lab Results  Component Value Date   TSH 2.21 09/25/2022   No results found for: "HGBA1C" Lab Results  Component Value Date   CHOL 137 01/11/2022   HDL 47 01/11/2022   LDLCALC 64 01/11/2022   TRIG 184 (A) 01/11/2022   CHOLHDL 2.4 07/14/2019    Significant Diagnostic Results in last 30 days:  No results found.  Assessment/Plan Orthostasis Blood pressure is maintained on Midodrine.   Restless leg syndrome stable on MiraPex, better sleep at night.   Depression, psychotic (HCC) Stable,   takes Sertraline,  Quetiapine. Change Tramadol to prn helped. TSH 2.21 09/25/22  Chronic constipation Stable,  takes Senokot S, Amitiza, MiraLax   Hypothyroidism takes Levothyroxine TSH 2.21 09/25/22, update TSH, CBC/diff, CMP/eGFR  Erosive (osteo)arthritis chronic lower back pain, travels to buttock and thighs,  aches and pains,  takes Plaquenil, Tylenol  f/u Rheumatology.   (HFpEF) heart failure with preserved ejection fraction (HCC) /trace edema BLE, takes Furosemide, cardiology, severe aortic valve stenosis. Echocardiogram 11/15/20 EF 60-65%. Bun/creat 24/1.0 01/01/23, weight stable.   Anemia  off Fe, ASA, Hgb 9.0 01/01/23, update CBC/diff  Severe aortic stenosis - not a candidate for TAVR Cardiology palliative approach. On Midodrine per Cardiology. More noticeable DOE, fatigue. under hospice service.     Parkinson's disease (HCC) takes Sinemet, MiraPex f/u Dr. Arbutus Leas, w/c for mobility most of time.  Progressing memory issues, SNF [facility] for supportive care.  MMSE 15/30 11/28/22, under Hospice care.   GERD  (gastroesophageal reflux disease)  takes Pantoprazole, Famotidine, yearly FOBT per GI, Hgb 9.0 01/01/23  Renal lithiasis Hx of rena lithiasis, recurrent UTI, urosepsis, treated by ID. 09/19/21 cystoscopy, laser lithotripsy, R ureteral stent/dilatation. Occasional dysuria present. Pyridium helps.    Family/ staff Communication: plan of care reviewed with the patient and charge nurse.   Labs/tests ordered: none  Time spend 30 minutes.

## 2023-10-22 NOTE — Assessment & Plan Note (Signed)
Stable,  takes Senokot S, Amitiza, MiraLax

## 2023-10-23 ENCOUNTER — Encounter: Payer: Self-pay | Admitting: Nurse Practitioner

## 2023-10-23 NOTE — Assessment & Plan Note (Signed)
Hx of rena lithiasis, recurrent UTI, urosepsis, treated by ID. 09/19/21 cystoscopy, laser lithotripsy, R ureteral stent/dilatation. Occasional dysuria present. Pyridium helps.

## 2023-11-11 ENCOUNTER — Non-Acute Institutional Stay (SKILLED_NURSING_FACILITY): Payer: Self-pay | Admitting: Sports Medicine

## 2023-11-11 ENCOUNTER — Encounter: Payer: Self-pay | Admitting: Sports Medicine

## 2023-11-11 DIAGNOSIS — I350 Nonrheumatic aortic (valve) stenosis: Secondary | ICD-10-CM | POA: Diagnosis not present

## 2023-11-11 DIAGNOSIS — F02B2 Dementia in other diseases classified elsewhere, moderate, with psychotic disturbance: Secondary | ICD-10-CM

## 2023-11-11 DIAGNOSIS — I951 Orthostatic hypotension: Secondary | ICD-10-CM

## 2023-11-11 DIAGNOSIS — R300 Dysuria: Secondary | ICD-10-CM

## 2023-11-11 DIAGNOSIS — G20A1 Parkinson's disease without dyskinesia, without mention of fluctuations: Secondary | ICD-10-CM

## 2023-11-11 DIAGNOSIS — I5032 Chronic diastolic (congestive) heart failure: Secondary | ICD-10-CM

## 2023-11-11 NOTE — Progress Notes (Signed)
Provider:   Venita Sheffield, MD Location:   [facility]   Place of Service:  Skilled care    PCP: [name], Man X, NP Patient Care Team: [name], Man X, NP as PCP - General (Internal Medicine) Quintella Reichert, MD as PCP - Cardiology (Cardiology) Arman Bogus, MD (Neurosurgery) Suanne Marker, MD (Neurology) Carman Ching, MD (Inactive) as Consulting Physician (Gastroenterology) Berneice Heinrich Delbert Phenix., MD as Consulting Physician (Urology) [name], Man X, NP as Nurse Practitioner (Internal Medicine) Tat, Octaviano Batty, DO as Consulting Physician (Neurology)  Extended Emergency Contact Information Primary Emergency Contact: [facility] Address: [address]          [place], [place] [address] D[place] of [place] Home Phone: [phone] Mobile Phone: [phone] Relation: Spouse Secondary Emergency Contact: Abbey Chatters Mobile Phone: [phone] Relation: Daughter Preferred language: [place] Interpreter needed? No  Code Status:  Goals of Care: Advanced Directive information    10/22/2023   10:17 AM  Advanced Directives  Does Patient Have a Medical Advance Directive? Yes  Type of Advance Directive Out of facility DNR (pink MOST or yellow form)  Does patient want to make changes to medical advance directive? No - Patient declined  Pre-existing out of facility DNR order (yellow form or pink MOST form) Pink MOST/Yellow Form most recent copy in chart - Physician notified to receive inpatient order      No chief complaint on file.                              HOSPICE RELATED VISIT   HPI: Patient is a 89 y.o. female seen today for routine visit for chronic disease management Pt seen and examined in the living room  She seems comfortable , watching TV  She knows her name, not oriented to time or place Cannot remember what she had for breakfast  Pt c/o pain with urination  As per nursing report pt has dysuria, her husband wants palliative care  approach and  did not want any diagnostic testing. Pt picks her nose and noted slight blood on the tissue paper     Past Medical History:  Diagnosis Date   Abnormal nuclear stress test    Actinic cheilitis 04/23/2019   Anemia    iron deficiency    Anxiety    Aortic stenosis    severe by echo 2021    Arthritis    Broken arm    right    Chronic diastolic CHF (congestive heart failure) (HCC)    Chronic pain    Cognitive communication deficit    Cognitive deficits    Communication   Constipation    Depression    Erosive (osteo)arthritis 03/09/2020   Family history of adverse reaction to anesthesia    pt. states sister vomits   Fibromyalgia    GERD (gastroesophageal reflux disease)    H/O hiatal hernia    Hallucinations, visual 07/21/2018   03/10/20 psych consult.    Hematuria 05/18/2020   History of bronchitis    History of kidney stones    Hypercholesterolemia    Hypertension    dr t turner   Hypothyroidism    Memory loss 04/21/2018   08/08/18 CT head no traumatic findings, atrophy.  09/27/19 MMSE 25/30, failed the clock drawing   Microhematuria 03/24/2015   Mild CAD    Neuropathy    Osteoporosis    Parkinson disease (HCC)    PVD (peripheral vascular disease) (  HCC)    99% stenosis of left anteiror tibial artery, mod stenosis of left distal SFA and popliteal artery followed by Dr. Darrick Penna   RBBB    noted on EKG 2018   Restless leg syndrome    Sepsis (HCC)    hx of due to Ecoli   Septic shock (HCC) 04/19/2020   Shortness of breath    with exertion - chronic    Sjogren's disease (HCC)    Sjogren's syndrome (HCC) 02/04/2018   04/16/19 rheumatology: Erosive OA of hands, Sjogrens syndrome, low back pain at multiple sites, recurrent kidney stones, age related osteoporosis w/o current pathological fracture, f/u 6 months.    SOB (shortness of breath)    chronic due to diastolic dysfunction, deconditioning, obesity   Spinal stenosis    lumbar region    Tremor    Unstable  gait 04/21/2018   Unsteadiness on feet    Urinary frequency 09/18/2018   Urinary tract infection    Visual hallucinations    Past Surgical History:  Procedure Laterality Date   ABDOMINAL HYSTERECTOMY     BACK SURGERY     4 back surgeries,   lumbar fusion   CARDIAC CATHETERIZATION  2006   normal   CYSTOSCOPY/URETEROSCOPY/HOLMIUM LASER/STENT PLACEMENT Left 01/06/2019   Procedure: CYSTOSCOPY/RETROGRADE/URETEROSCOPY/HOLMIUM LASER/BASKET RETRIEVAL/STENT PLACEMENT;  Surgeon: Rene Paci, MD;  Location: [facility];  Service: Urology;  Laterality: Left;   CYSTOSCOPY/URETEROSCOPY/HOLMIUM LASER/STENT PLACEMENT Bilateral 05/24/2020   Procedure: CYSTOSCOPY/RETROGRADE/URETEROSCOPY/HOLMIUM LASER/STENT PLACEMENT;  Surgeon: Rene Paci, MD;  Location: [facility];  Service: Urology;  Laterality: Bilateral;   CYSTOSCOPY/URETEROSCOPY/HOLMIUM LASER/STENT PLACEMENT Right 09/27/2021   Procedure: CYSTOSCOPY/BILATERAL RETROGRADE/URETEROSCOPY/HOLMIUM LASER/STENT PLACEMENT;  Surgeon: Rene Paci, MD;  Location: [facility];  Service: Urology;  Laterality: Right;  ONLY NEEDS 60 MIN   EYE SURGERY Bilateral    cateracts   falls     variious fall, broken wrist,and toes   FRACTURE SURGERY Right April 2016   Wrist, Pt. fell   IR NEPHROSTOMY PLACEMENT RIGHT  04/19/2020   kidney stone removal Left 01/20/2019   RIGHT/LEFT HEART CATH AND CORONARY ANGIOGRAPHY N/A 02/05/2018   Procedure: RIGHT/LEFT HEART CATH AND CORONARY ANGIOGRAPHY;  Surgeon: Lennette Bihari, MD;  Location: [facility];  Service: Cardiovascular;  Laterality: N/A;   SPINAL CORD STIMULATOR INSERTION N/A 09/09/2015   Procedure: LUMBAR SPINAL CORD STIMULATOR INSERTION;  Surgeon: Odette Fraction, MD;  Location: [facility];  Service: Neurosurgery;  Laterality: N/A;  LUMBAR SPINAL CORD STIMULATOR INSERTION   SPINE SURGERY  April 2013   Back X's 4   TOTAL HIP ARTHROPLASTY     right   WRIST FRACTURE SURGERY Bilateral      reports that she has never smoked. She has never used smokeless tobacco. She reports that she does not drink alcohol and does not use drugs. Social History   Socioeconomic History   Marital status: Married    Spouse name: Jimmy Picket   Number of children: 2   Years of education: HS   Highest education level: Not on file  Occupational History    Comment: Homemaker  Tobacco Use   Smoking status: Never   Smokeless tobacco: Never  Vaping Use   Vaping status: Never Used  Substance and Sexual Activity   Alcohol use: No    Alcohol/week: 0.0 standard drinks of alcohol   Drug use: No   Sexual activity: Never  Other Topics Concern   Not on file  Social History Narrative   Patient lives at  friends home with her spouse. Married in 1957. Two people lives in the home, no pets.    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    Caffeine Use: tea   Social Determinants of Health   Financial Resource Strain: Low Risk  (10/27/2018)   Overall Financial Resource Strain (CARDIA)    Difficulty of Paying Living Expenses: Not hard at all  Food Insecurity: No Food Insecurity (10/27/2018)   Hunger Vital Sign    Worried About Running Out of Food in the Last Year: Never true    Ran Out of Food in the Last Year: Never true  Transportation Needs: No Transportation Needs (10/27/2018)   PRAPARE - Administrator, Civil Service (Medical): No    Lack of Transportation (Non-Medical): No  Physical Activity: Insufficiently Active (10/27/2018)   Exercise Vital Sign    Days of Exercise per Week: 2 days    Minutes of Exercise per Session: 30 min  Stress: No Stress Concern Present (10/27/2018)   [employer] of Occupational Health - Occupational Stress Questionnaire    Feeling of Stress : Only a little  Social Connections: Socially Integrated (10/27/2018)   Social Connection and Isolation Panel [NHANES]    Frequency of Communication with Friends  and Family: More than three times a week    Frequency of Social Gatherings with Friends and Family: More than three times a week    Attends Religious Services: More than 4 times per year    Active Member of Clubs or Organizations: Yes    Attends Banker Meetings: More than 4 times per year    Marital Status: Married  Catering manager Violence: Not At Risk (10/27/2018)   Humiliation, Afraid, Rape, and Kick questionnaire    Fear of Current or Ex-Partner: No    Emotionally Abused: No    Physically Abused: No    Sexually Abused: No    Functional Status Survey:    Family History  Problem Relation Age of Onset   Heart attack Mother    Restless legs syndrome Mother    Heart failure Mother    Heart disease Mother    Hypertension Mother    COPD Father     Health Maintenance  Topic Date Due   COVID-19 Vaccine (7 - 2023-24 season) 11/20/2023   DTaP/Tdap/Td (4 - Td or Tdap) 06/20/2026   Pneumonia Vaccine 12+ Years old  Completed   INFLUENZA VACCINE  Completed   DEXA SCAN  Completed   Zoster Vaccines- Shingrix  Completed   HPV VACCINES  Aged Out    Allergies  Allergen Reactions   Phosphate     Other reaction(s): Unknown   Adrenalone    Codeine Other (See Comments)    Reaction:  Headaches and nightmares  Other reaction(s): Unknown   Lactose Other (See Comments)    abd pain, lactose intolerant   Lactose Intolerance (Gi) Nausea And Vomiting   Latex Rash   Lyrica [Pregabalin] Swelling and Other (See Comments)    Reaction:  Leg swelling   Morphine Other (See Comments)   Other Other (See Comments)    Pt states that pain medications give her nightmares.     Plaquenil [Hydroxychloroquine Sulfate] Other (See Comments)    Reaction:  GI upset    Reglan [Metoclopramide] Other (See Comments)    Reaction:  GI upset    Requip [Ropinirole Hcl] Other (See Comments)    Reaction:  GI upset  Septra [Sulfamethoxazole-Trimethoprim] Nausea And Vomiting   Shellfish Allergy  Nausea And Vomiting   Sulfa Antibiotics Other (See Comments)    Headache, very sick    Outpatient Encounter Medications as of 11/11/2023  Medication Sig   acetaminophen (TYLENOL) 325 MG tablet Take 650 mg by mouth every 4 (four) hours as needed. Do not exceed 3000 mg of acetaminophen in 24 hours   acetaminophen (TYLENOL) 500 MG tablet Take 500 mg by mouth 3 (three) times daily.   carbidopa-levodopa (SINEMET IR) 25-100 MG tablet Take 1 tablet by mouth 3 (three) times daily.   docusate sodium (COLACE) 100 MG capsule Take 100 mg by mouth at bedtime.   famotidine (PEPCID) 20 MG tablet Take 20 mg by mouth daily.   furosemide (LASIX) 20 MG tablet Take 1 tablet (20 mg total) by mouth daily.   hydroxychloroquine (PLAQUENIL) 200 MG tablet Take 200 mg by mouth daily.   lactose free nutrition (BOOST) LIQD Take 237 mLs by mouth daily.   levothyroxine (SYNTHROID, LEVOTHROID) 50 MCG tablet Take 50 mcg by mouth daily before breakfast.    lubiprostone (AMITIZA) 24 MCG capsule Take 24 mcg by mouth 2 (two) times daily with a meal.    midodrine (PROAMATINE) 5 MG tablet Take 1 tablet (5 mg total) by mouth 3 (three) times daily with meals.   Multiple Vitamins-Minerals (PRESERVISION AREDS 2 PO) Take 1 tablet by mouth daily.   nystatin powder Apply 1 Application topically daily. ABDOMINAL SKIN FOLD, UNDER BREAST AND GROIN   pantoprazole (PROTONIX) 40 MG tablet Take 40 mg by mouth daily.   Polyethyl Glycol-Propyl Glycol (SYSTANE) 0.4-0.3 % SOLN Apply 1 drop to eye 2 (two) times daily as needed (dry eyes).    polyethylene glycol (MIRALAX / GLYCOLAX) 17 g packet Take 17 g by mouth daily.   potassium citrate (UROCIT-K) 10 MEQ (1080 MG) SR tablet Take 10 mEq by mouth 2 (two) times daily.   pramipexole (MIRAPEX) 0.125 MG tablet Take 0.125 mg by mouth at bedtime.   QUEtiapine (SEROQUEL) 25 MG tablet Take 25 mg by mouth daily. See other listing   QUEtiapine (SEROQUEL) 50 MG tablet Take 50 mg by mouth daily. See other  listing   senna-docusate (SENOKOT-S) 8.6-50 MG tablet Take 1 tablet by mouth at bedtime.    sertraline (ZOLOFT) 100 MG tablet Take 100 mg by mouth at bedtime. With 25 mg tablet to equal 125 mg   sertraline (ZOLOFT) 25 MG tablet Take 25 mg by mouth at bedtime. Along with 100 mg tablet to equal 125 mg   No facility-administered encounter medications on file as of 11/11/2023.    Review of Systems  Constitutional:  Negative for fever.  Respiratory:  Negative for cough and shortness of breath.   Cardiovascular:  Negative for chest pain and leg swelling.  Gastrointestinal:  Negative for abdominal distention, abdominal pain, blood in stool, constipation, diarrhea, nausea and vomiting.  Genitourinary:  Positive for dysuria. Negative for frequency and urgency.  Neurological:  Negative for dizziness.  Psychiatric/Behavioral:  Positive for confusion.     There were no vitals filed for this visit. There is no height or weight on file to calculate BMI. Physical Exam Constitutional:      Appearance: Normal appearance.  Cardiovascular:     Rate and Rhythm: Normal rate and regular rhythm.     Heart sounds: Murmur heard.  Pulmonary:     Effort: Pulmonary effort is normal. No respiratory distress.     Breath sounds: Normal breath sounds. No  wheezing.  Abdominal:     General: Bowel sounds are normal. There is no distension.     Tenderness: There is no abdominal tenderness. There is no guarding or rebound.     Comments: Abdominal incision - healed well No erythema or tenderness   Musculoskeletal:        General: No swelling or tenderness.  Neurological:     Mental Status: She is alert. Mental status is at baseline.     Sensory: No sensory deficit.     Motor: No weakness.     Labs reviewed: Basic Metabolic Panel: Recent Labs    01/01/23 0630  NA 140  K 4.3  CL 107  CO2 25*  BUN 24*  CREATININE 1.0  CALCIUM 9.3   Liver Function Tests: Recent Labs    01/01/23 0630  AST 23  ALT 13   ALKPHOS 67  ALBUMIN 3.7   No results for input(s): "LIPASE", "AMYLASE" in the last 8760 hours. No results for input(s): "AMMONIA" in the last 8760 hours. CBC: Recent Labs    12/13/22 0630 01/01/23 0630  WBC 6.5 7.9  NEUTROABS 3,569.00 5,246.00  HGB 10.6* 9.0*  HCT 32* 27*  PLT 259 225   Cardiac Enzymes: No results for input(s): "CKTOTAL", "CKMB", "CKMBINDEX", "TROPONINI" in the last 8760 hours. BNP: Invalid input(s): "POCBNP" No results found for: "HGBA1C" Lab Results  Component Value Date   TSH 2.21 09/25/2022   No results found for: "VITAMINB12" No results found for: "FOLATE" No results found for: "IRON", "TIBC", "FERRITIN"  Imaging and Procedures obtained prior to SNF admission: DG C-Arm 1-60 Min  Result Date: 09/27/2021 CLINICAL DATA:  Cystoscopy with right ureteroscopy and laser lithotripsy. Right ureteral stent placement. Right ureteral dilatation. Bilateral retrograde pyelogram. EXAM: DG C-ARM 1-60 MIN CONTRAST:  Not provided FLUOROSCOPY TIME:  Fluoroscopy Time:  1 minute 9 seconds Radiation Exposure Index (if provided by the fluoroscopic device): 14.4 mGy Number of Acquired Spot Images: 6 COMPARISON:  CT urogram 09/08/2021 FINDINGS: Submitted intraoperative fluoroscopic images demonstrate balloon dilatation the distal right ureter. Right ureteral stent is seen. There is cannulation of the distal left ureter with retrograde opacification of the renal collecting system and ureter. IMPRESSION: Intraoperative images of left retrograde pyelogram and right ureteral dilation and stent placement as above. Electronically Signed   By: Acquanetta Belling M.D.   On: 09/27/2021 14:26    Assessment/Plan  Dysuria  Pt c/o dysuria Her husband who is POA wanted palliative approach  Will check with her husband if he wants her to be checked for uti   Instructed nurse to give her pyridium     Major Neurocognitive disorder  Cont with supportive care  Cont with zoloft ,  seroquel Functional Assessment Staging Scale: 7c - Ambulatory ability is lost (cannot walk without personal assistance).   Parkinsons disease Wheelchair dependent  On sinemet    Hospice care  Pt  c/o dysuria Will start pyridium  Will check with husband if he wants her to be checked for UTI    Hypothyroidism  Cont with levothyroxine  Erosive osteoarthritis  On plaquenil  HF pEF  Severe aortic stenosis  Echo - ef 60-65% Cont with lasix    Orthostatic hypotension  On midodrine  Nose bleeds Will monitor    Family/ staff Communication:  care plan discussed with the nursing staff    30 Total time spent for obtaining history,  performing a medically appropriate examination and evaluation, reviewing the tests,ordering  tests,  documenting clinical information  in the electronic or other health record, independently interpreting results ,care coordination (not separately reported)

## 2023-11-30 ENCOUNTER — Telehealth: Payer: Self-pay | Admitting: Adult Health

## 2023-12-07 ENCOUNTER — Telehealth: Payer: Self-pay | Admitting: Adult Health

## 2023-12-09 ENCOUNTER — Telehealth: Payer: Self-pay

## 2023-12-09 NOTE — Telephone Encounter (Signed)
Nurse called to report that she appeared pale and weak. We considered ordering tests but then the nurse called the family and they opted for comfort care only.

## 2023-12-09 NOTE — Telephone Encounter (Signed)
ERROR

## 2023-12-09 NOTE — Telephone Encounter (Signed)
Response sent to Clydie Braun. You both can routed messages to one another.

## 2023-12-09 NOTE — Telephone Encounter (Signed)
Amanda Sheffield, MD  [employer]Just now (3:23 PM)    Yes, I was not able to certify it, some issue with SSN on her death certificate.

## 2023-12-09 NOTE — Telephone Encounter (Signed)
[name] from front administration states that patient has some issue with her death certificate date.

## 2023-12-11 DEATH — deceased
